# Patient Record
Sex: Male | Born: 1940
Health system: Southern US, Community
[De-identification: ages and names within clinical notes are randomized; demographics above are authoritative.]

## PROBLEM LIST (undated history)

## (undated) DIAGNOSIS — R011 Cardiac murmur, unspecified: Secondary | ICD-10-CM

## (undated) DIAGNOSIS — J189 Pneumonia, unspecified organism: Secondary | ICD-10-CM

## (undated) DIAGNOSIS — R569 Unspecified convulsions: Secondary | ICD-10-CM

## (undated) DIAGNOSIS — M1611 Unilateral primary osteoarthritis, right hip: Secondary | ICD-10-CM

## (undated) DIAGNOSIS — N200 Calculus of kidney: Secondary | ICD-10-CM

## (undated) DIAGNOSIS — I5042 Chronic combined systolic (congestive) and diastolic (congestive) heart failure: Secondary | ICD-10-CM

## (undated) DIAGNOSIS — Z992 Dependence on renal dialysis: Secondary | ICD-10-CM

## (undated) DIAGNOSIS — Z87442 Personal history of urinary calculi: Secondary | ICD-10-CM

## (undated) DIAGNOSIS — B009 Herpesviral infection, unspecified: Secondary | ICD-10-CM

## (undated) DIAGNOSIS — D62 Acute posthemorrhagic anemia: Secondary | ICD-10-CM

## (undated) DIAGNOSIS — K2970 Gastritis, unspecified, without bleeding: Secondary | ICD-10-CM

## (undated) DIAGNOSIS — N186 End stage renal disease: Secondary | ICD-10-CM

## (undated) DIAGNOSIS — I499 Cardiac arrhythmia, unspecified: Secondary | ICD-10-CM

## (undated) DIAGNOSIS — K2990 Gastroduodenitis, unspecified, without bleeding: Secondary | ICD-10-CM

## (undated) DIAGNOSIS — K635 Polyp of colon: Secondary | ICD-10-CM

## (undated) DIAGNOSIS — G4733 Obstructive sleep apnea (adult) (pediatric): Secondary | ICD-10-CM

## (undated) DIAGNOSIS — I4891 Unspecified atrial fibrillation: Secondary | ICD-10-CM

## (undated) DIAGNOSIS — M069 Rheumatoid arthritis, unspecified: Secondary | ICD-10-CM

## (undated) DIAGNOSIS — I10 Essential (primary) hypertension: Secondary | ICD-10-CM

## (undated) DIAGNOSIS — Z9289 Personal history of other medical treatment: Secondary | ICD-10-CM

## (undated) DIAGNOSIS — D696 Thrombocytopenia, unspecified: Secondary | ICD-10-CM

## (undated) DIAGNOSIS — K922 Gastrointestinal hemorrhage, unspecified: Secondary | ICD-10-CM

## (undated) HISTORY — DX: Gastroduodenitis, unspecified, without bleeding: K29.90

## (undated) HISTORY — PX: LITHOTRIPSY: SUR834

## (undated) HISTORY — DX: Rheumatoid arthritis, unspecified: M06.9

## (undated) HISTORY — DX: Chronic combined systolic (congestive) and diastolic (congestive) heart failure: I50.42

## (undated) HISTORY — DX: Obstructive sleep apnea (adult) (pediatric): G47.33

## (undated) HISTORY — DX: Unspecified atrial fibrillation: I48.91

## (undated) HISTORY — DX: Calculus of kidney: N20.0

## (undated) HISTORY — DX: Unilateral primary osteoarthritis, right hip: M16.11

## (undated) HISTORY — DX: Gastritis, unspecified, without bleeding: K29.70

## (undated) HISTORY — DX: Acute posthemorrhagic anemia: D62

## (undated) HISTORY — DX: Polyp of colon: K63.5

## (undated) HISTORY — PX: SPINE SURGERY: SHX786

## (undated) HISTORY — DX: Thrombocytopenia, unspecified: D69.6

## (undated) HISTORY — PX: BACK SURGERY: SHX140

## (undated) HISTORY — PX: CYSTOSCOPY/RETROGRADE/URETEROSCOPY/STONE EXTRACTION WITH BASKET: SHX5317

## (undated) HISTORY — PX: JOINT REPLACEMENT: SHX530

## (undated) HISTORY — DX: Gastrointestinal hemorrhage, unspecified: K92.2

---

## 1992-07-18 HISTORY — PX: CHOLECYSTECTOMY: SHX55

## 1998-07-18 DIAGNOSIS — K635 Polyp of colon: Secondary | ICD-10-CM

## 1998-07-18 HISTORY — DX: Polyp of colon: K63.5

## 2008-04-05 ENCOUNTER — Emergency Department (HOSPITAL_BASED_OUTPATIENT_CLINIC_OR_DEPARTMENT_OTHER): Admission: EM | Admit: 2008-04-05 | Discharge: 2008-04-05 | Payer: Self-pay | Admitting: Emergency Medicine

## 2009-07-22 ENCOUNTER — Ambulatory Visit: Payer: Self-pay | Admitting: Family

## 2009-07-22 DIAGNOSIS — I10 Essential (primary) hypertension: Secondary | ICD-10-CM

## 2009-07-22 DIAGNOSIS — Z87442 Personal history of urinary calculi: Secondary | ICD-10-CM | POA: Insufficient documentation

## 2009-07-22 DIAGNOSIS — I4891 Unspecified atrial fibrillation: Secondary | ICD-10-CM

## 2009-07-22 DIAGNOSIS — M109 Gout, unspecified: Secondary | ICD-10-CM | POA: Insufficient documentation

## 2009-07-22 DIAGNOSIS — M069 Rheumatoid arthritis, unspecified: Secondary | ICD-10-CM

## 2009-07-22 LAB — CONVERTED CEMR LAB: INR: 1.8

## 2009-07-23 ENCOUNTER — Encounter (INDEPENDENT_AMBULATORY_CARE_PROVIDER_SITE_OTHER): Payer: Self-pay | Admitting: *Deleted

## 2009-07-24 ENCOUNTER — Ambulatory Visit: Payer: Self-pay | Admitting: Family

## 2009-07-27 ENCOUNTER — Ambulatory Visit: Payer: Self-pay | Admitting: Family

## 2009-07-27 LAB — CONVERTED CEMR LAB: INR: 2.8

## 2009-07-28 LAB — CONVERTED CEMR LAB
ALT: 21 units/L (ref 0–53)
Albumin: 4.1 g/dL (ref 3.5–5.2)
CO2: 30 meq/L (ref 19–32)
Calcium: 9.6 mg/dL (ref 8.4–10.5)
Chloride: 102 meq/L (ref 96–112)
Creatinine, Ser: 1.3 mg/dL (ref 0.4–1.5)
GFR calc non Af Amer: 70.98 mL/min (ref >60)
Hgb A1c MFr Bld: 6.3 % (ref 4.6–6.5)
PSA: 0.43 ng/mL (ref 0.10–4.00)
Potassium: 4.1 meq/L (ref 3.5–5.1)
Sodium: 140 meq/L (ref 135–145)
Total Protein: 8 g/dL (ref 6.0–8.3)

## 2009-07-30 ENCOUNTER — Encounter: Payer: Self-pay | Admitting: Family

## 2009-07-30 ENCOUNTER — Encounter (INDEPENDENT_AMBULATORY_CARE_PROVIDER_SITE_OTHER): Payer: Self-pay | Admitting: *Deleted

## 2009-08-03 ENCOUNTER — Telehealth (INDEPENDENT_AMBULATORY_CARE_PROVIDER_SITE_OTHER): Payer: Self-pay | Admitting: *Deleted

## 2009-08-06 ENCOUNTER — Ambulatory Visit: Payer: Self-pay | Admitting: Internal Medicine

## 2009-08-17 ENCOUNTER — Ambulatory Visit: Payer: Self-pay | Admitting: Cardiology

## 2009-08-17 ENCOUNTER — Ambulatory Visit: Payer: Self-pay | Admitting: Internal Medicine

## 2009-08-17 ENCOUNTER — Encounter (INDEPENDENT_AMBULATORY_CARE_PROVIDER_SITE_OTHER): Payer: Self-pay | Admitting: Cardiology

## 2009-08-17 LAB — CONVERTED CEMR LAB: POC INR: 2.4

## 2009-08-25 ENCOUNTER — Ambulatory Visit: Payer: Self-pay | Admitting: Gastroenterology

## 2009-08-25 ENCOUNTER — Encounter (INDEPENDENT_AMBULATORY_CARE_PROVIDER_SITE_OTHER): Payer: Self-pay | Admitting: *Deleted

## 2009-08-25 DIAGNOSIS — Z860100 Personal history of colon polyps, unspecified: Secondary | ICD-10-CM | POA: Insufficient documentation

## 2009-08-26 ENCOUNTER — Ambulatory Visit: Payer: Self-pay | Admitting: Family

## 2009-08-26 ENCOUNTER — Encounter (INDEPENDENT_AMBULATORY_CARE_PROVIDER_SITE_OTHER): Payer: Self-pay | Admitting: *Deleted

## 2009-08-26 ENCOUNTER — Encounter: Payer: Self-pay | Admitting: Gastroenterology

## 2009-08-26 LAB — CONVERTED CEMR LAB
Glucose, Bld: 110 mg/dL — ABNORMAL HIGH (ref 70–99)
Sodium: 139 meq/L (ref 135–145)

## 2009-08-28 ENCOUNTER — Telehealth: Payer: Self-pay | Admitting: Family

## 2009-09-07 ENCOUNTER — Ambulatory Visit: Payer: Self-pay | Admitting: Gastroenterology

## 2009-09-07 LAB — HM COLONOSCOPY

## 2009-09-15 ENCOUNTER — Ambulatory Visit: Payer: Self-pay | Admitting: Cardiovascular Disease

## 2009-09-15 LAB — CONVERTED CEMR LAB: POC INR: 1.8

## 2009-09-21 ENCOUNTER — Telehealth: Payer: Self-pay | Admitting: Family

## 2009-09-21 ENCOUNTER — Ambulatory Visit: Payer: Self-pay | Admitting: Family

## 2009-09-21 DIAGNOSIS — N529 Male erectile dysfunction, unspecified: Secondary | ICD-10-CM

## 2009-09-21 DIAGNOSIS — M1990 Unspecified osteoarthritis, unspecified site: Secondary | ICD-10-CM

## 2009-09-21 LAB — CONVERTED CEMR LAB
Basophils Absolute: 0 10*3/uL (ref 0.0–0.1)
Bilirubin Urine: NEGATIVE
Blood in Urine, dipstick: NEGATIVE
CO2: 31 meq/L (ref 19–32)
Calcium: 10 mg/dL (ref 8.4–10.5)
Creatinine, Ser: 1.3 mg/dL (ref 0.4–1.5)
Eosinophils Absolute: 0.2 10*3/uL (ref 0.0–0.7)
GFR calc non Af Amer: 70.95 mL/min (ref >60)
Glucose, Bld: 83 mg/dL (ref 70–99)
Glucose, Urine, Semiquant: NEGATIVE
Ketones, urine, test strip: NEGATIVE
Lymphs Abs: 2.4 10*3/uL (ref 0.7–4.0)
MCV: 93.5 fL (ref 78.0–100.0)
Nitrite: NEGATIVE
Protein, U semiquant: 30
RBC: 4.89 M/uL (ref 4.22–5.81)
Specific Gravity, Urine: 1.01
Urobilinogen, UA: 0.2
WBC Urine, dipstick: NEGATIVE

## 2009-09-22 ENCOUNTER — Encounter: Payer: Self-pay | Admitting: Family Medicine

## 2009-09-23 ENCOUNTER — Ambulatory Visit (HOSPITAL_BASED_OUTPATIENT_CLINIC_OR_DEPARTMENT_OTHER): Admission: RE | Admit: 2009-09-23 | Discharge: 2009-09-23 | Payer: Self-pay | Admitting: Internal Medicine

## 2009-09-23 ENCOUNTER — Ambulatory Visit: Payer: Self-pay | Admitting: Interventional Radiology

## 2009-09-29 ENCOUNTER — Ambulatory Visit: Payer: Self-pay | Admitting: Cardiology

## 2009-09-29 LAB — CONVERTED CEMR LAB: POC INR: 2.7

## 2009-10-08 ENCOUNTER — Telehealth: Payer: Self-pay | Admitting: Family

## 2009-10-12 ENCOUNTER — Ambulatory Visit: Payer: Self-pay | Admitting: Family

## 2009-10-14 ENCOUNTER — Encounter (INDEPENDENT_AMBULATORY_CARE_PROVIDER_SITE_OTHER): Payer: Self-pay | Admitting: Cardiology

## 2009-10-14 ENCOUNTER — Ambulatory Visit: Payer: Self-pay | Admitting: Cardiovascular Disease

## 2009-10-14 LAB — CONVERTED CEMR LAB: POC INR: 2.3

## 2009-10-15 ENCOUNTER — Telehealth (INDEPENDENT_AMBULATORY_CARE_PROVIDER_SITE_OTHER): Payer: Self-pay | Admitting: *Deleted

## 2009-10-20 ENCOUNTER — Inpatient Hospital Stay (HOSPITAL_COMMUNITY): Admission: RE | Admit: 2009-10-20 | Discharge: 2009-10-26 | Payer: Self-pay | Admitting: Orthopaedic Surgery

## 2009-10-20 ENCOUNTER — Encounter (INDEPENDENT_AMBULATORY_CARE_PROVIDER_SITE_OTHER): Payer: Self-pay | Admitting: Orthopaedic Surgery

## 2009-10-24 ENCOUNTER — Encounter (INDEPENDENT_AMBULATORY_CARE_PROVIDER_SITE_OTHER): Payer: Self-pay | Admitting: Orthopaedic Surgery

## 2009-10-24 ENCOUNTER — Ambulatory Visit: Payer: Self-pay | Admitting: Vascular Surgery

## 2009-10-27 ENCOUNTER — Telehealth: Payer: Self-pay | Admitting: Family

## 2009-10-29 ENCOUNTER — Encounter: Payer: Self-pay | Admitting: Family

## 2009-11-10 ENCOUNTER — Encounter: Payer: Self-pay | Admitting: Cardiovascular Disease

## 2009-11-23 ENCOUNTER — Ambulatory Visit: Payer: Self-pay | Admitting: Family

## 2009-11-23 DIAGNOSIS — D649 Anemia, unspecified: Secondary | ICD-10-CM | POA: Insufficient documentation

## 2009-11-23 DIAGNOSIS — E1129 Type 2 diabetes mellitus with other diabetic kidney complication: Secondary | ICD-10-CM

## 2009-11-23 LAB — CONVERTED CEMR LAB
CO2: 27 meq/L (ref 19–32)
Calcium: 10 mg/dL (ref 8.4–10.5)
Glucose, Bld: 94 mg/dL (ref 70–99)
HCT: 35.4 % — ABNORMAL LOW (ref 39.0–52.0)
Hemoglobin: 11.7 g/dL — ABNORMAL LOW (ref 13.0–17.0)
MCHC: 33.1 g/dL (ref 30.0–36.0)
MCV: 91.5 fL (ref 78.0–100.0)
Potassium: 4.2 meq/L (ref 3.5–5.3)
RBC: 3.87 M/uL — ABNORMAL LOW (ref 4.22–5.81)
RDW: 15.9 % — ABNORMAL HIGH (ref 11.5–15.5)

## 2009-11-24 ENCOUNTER — Encounter: Payer: Self-pay | Admitting: Family

## 2009-11-25 ENCOUNTER — Encounter: Admission: RE | Admit: 2009-11-25 | Discharge: 2010-02-23 | Payer: Self-pay | Admitting: Orthopaedic Surgery

## 2009-12-02 ENCOUNTER — Ambulatory Visit: Payer: Self-pay | Admitting: Cardiology

## 2009-12-02 LAB — CONVERTED CEMR LAB: POC INR: 1.9

## 2009-12-16 ENCOUNTER — Ambulatory Visit: Payer: Self-pay | Admitting: Cardiology

## 2009-12-16 LAB — CONVERTED CEMR LAB: POC INR: 2.5

## 2009-12-30 ENCOUNTER — Telehealth: Payer: Self-pay | Admitting: Family

## 2010-01-06 ENCOUNTER — Ambulatory Visit: Payer: Self-pay | Admitting: Cardiology

## 2010-01-06 LAB — CONVERTED CEMR LAB: POC INR: 3.2

## 2010-01-25 ENCOUNTER — Telehealth: Payer: Self-pay | Admitting: Family

## 2010-01-26 ENCOUNTER — Ambulatory Visit: Payer: Self-pay | Admitting: Family

## 2010-01-27 ENCOUNTER — Ambulatory Visit: Payer: Self-pay | Admitting: Internal Medicine

## 2010-01-27 LAB — CONVERTED CEMR LAB: POC INR: 2.3

## 2010-02-03 ENCOUNTER — Telehealth: Payer: Self-pay | Admitting: Family

## 2010-02-24 ENCOUNTER — Ambulatory Visit: Payer: Self-pay | Admitting: Cardiology

## 2010-03-24 ENCOUNTER — Ambulatory Visit: Payer: Self-pay | Admitting: Cardiovascular Disease

## 2010-03-31 ENCOUNTER — Ambulatory Visit: Payer: Self-pay | Admitting: Family

## 2010-04-16 ENCOUNTER — Encounter: Payer: Self-pay | Admitting: Family

## 2010-04-16 LAB — CONVERTED CEMR LAB
Basophils Relative: 0 % (ref 0–1)
Cholesterol: 171 mg/dL (ref 0–200)
Eosinophils Absolute: 0.1 10*3/uL (ref 0.0–0.7)
Eosinophils Relative: 2 % (ref 0–5)
Hemoglobin: 14 g/dL (ref 13.0–17.0)
LDL Cholesterol: 93 mg/dL (ref 0–99)
Lymphs Abs: 2.9 10*3/uL (ref 0.7–4.0)
MCV: 91.8 fL (ref 78.0–100.0)
Monocytes Relative: 6 % (ref 3–12)
Platelets: 277 10*3/uL (ref 150–400)
Total CHOL/HDL Ratio: 2.5
VLDL: 10 mg/dL (ref 0–40)
WBC: 8.2 10*3/uL (ref 4.0–10.5)

## 2010-04-19 ENCOUNTER — Encounter: Payer: Self-pay | Admitting: Family

## 2010-04-21 ENCOUNTER — Ambulatory Visit: Payer: Self-pay | Admitting: Cardiovascular Disease

## 2010-04-21 LAB — CONVERTED CEMR LAB: POC INR: 2.3

## 2010-04-27 ENCOUNTER — Ambulatory Visit: Payer: Self-pay | Admitting: Family

## 2010-04-27 ENCOUNTER — Telehealth: Payer: Self-pay | Admitting: Family

## 2010-04-27 LAB — CONVERTED CEMR LAB
BUN: 20 mg/dL (ref 6–23)
Calcium: 9.4 mg/dL (ref 8.4–10.5)
Creatinine, Ser: 1.1 mg/dL (ref 0.40–1.50)
Glucose, Bld: 102 mg/dL — ABNORMAL HIGH (ref 70–99)
Potassium: 4.2 meq/L (ref 3.5–5.3)

## 2010-04-28 ENCOUNTER — Encounter: Payer: Self-pay | Admitting: Family

## 2010-04-29 ENCOUNTER — Telehealth: Payer: Self-pay | Admitting: Family

## 2010-04-30 ENCOUNTER — Ambulatory Visit: Payer: Self-pay | Admitting: Family

## 2010-05-21 ENCOUNTER — Ambulatory Visit: Payer: Self-pay | Admitting: Cardiology

## 2010-05-31 ENCOUNTER — Telehealth: Payer: Self-pay | Admitting: Family

## 2010-05-31 ENCOUNTER — Ambulatory Visit: Payer: Self-pay | Admitting: Family

## 2010-05-31 DIAGNOSIS — A6000 Herpesviral infection of urogenital system, unspecified: Secondary | ICD-10-CM

## 2010-05-31 LAB — CONVERTED CEMR LAB
Herpes Simplex Vrs I&II-IgM Ab (EIA): 0.23
Uric Acid, Serum: 5.5 mg/dL (ref 4.0–7.8)

## 2010-06-02 ENCOUNTER — Encounter: Payer: Self-pay | Admitting: Family

## 2010-06-03 ENCOUNTER — Telehealth: Payer: Self-pay | Admitting: Family

## 2010-06-04 ENCOUNTER — Telehealth: Payer: Self-pay | Admitting: Family

## 2010-06-21 ENCOUNTER — Ambulatory Visit: Payer: Self-pay | Admitting: Internal Medicine

## 2010-06-21 LAB — CONVERTED CEMR LAB: POC INR: 3.3

## 2010-06-23 ENCOUNTER — Ambulatory Visit: Payer: Self-pay | Admitting: Family

## 2010-07-07 ENCOUNTER — Telehealth: Payer: Self-pay | Admitting: Family

## 2010-07-14 ENCOUNTER — Telehealth: Payer: Self-pay | Admitting: Family

## 2010-07-20 ENCOUNTER — Ambulatory Visit: Admission: RE | Admit: 2010-07-20 | Discharge: 2010-07-20 | Payer: Self-pay | Source: Home / Self Care

## 2010-07-20 LAB — CONVERTED CEMR LAB: POC INR: 1.4

## 2010-07-29 ENCOUNTER — Ambulatory Visit: Admission: RE | Admit: 2010-07-29 | Discharge: 2010-07-29 | Payer: Self-pay | Source: Home / Self Care

## 2010-08-17 NOTE — Medication Information (Signed)
Summary: rov/tm  Anticoagulant Therapy  Managed by: Freddrick March, RN, BSN Referring MD: Cristopher Peru, MD PCP: Nance Pear FNP Supervising MD: Ron Parker MD, Dellis Filbert Indication 1: Atrial fibrillation Lab Used: [facility] [place] Site: [place] INR POC 2.7 INR RANGE 2-3  Dietary changes: no    Health status changes: no    Bleeding/hemorrhagic complications: no    Recent/future hospitalizations: no    Any changes in medication regimen? no    Recent/future dental: no  Any missed doses?: no       Is patient compliant with meds? yes       Allergies (verified): No Known Drug Allergies  Anticoagulation Management History:      The patient is taking warfarin and comes in today for a routine follow up visit.  Positive risk factors for bleeding include an age of 68 years or older.  The bleeding index is 'intermediate risk'.  Positive CHADS2 values include History of HTN.  Negative CHADS2 values include Age > 24 years old.  His last INR was 2.8.  Anticoagulation responsible provider: Ron Parker MD, Dellis Filbert.  INR POC: 2.7.  Cuvette Lot#: [id number].  Exp: 11/2010.    Anticoagulation Management Assessment/Plan:      The patient's current anticoagulation dose is Warfarin sodium 7.5 mg tabs: Use as directed by Anticoagulation Clinic.  The target INR is 2.0-3.0.  The next INR is due 10/20/2009.  Anticoagulation instructions were given to patient.  Results were reviewed/authorized by Freddrick March, RN, BSN.  He was notified by Freddrick March RN.         Prior Anticoagulation Instructions: INR 1.8 Today 10mg s then resume 7.5mg s daily except 10mg s on Thursdays. Recheck in 2 weeks.   Current Anticoagulation Instructions: INR 2.7  Continue on same dosage 7.5mg  daily except 10mg  on Thursdays.  Recheck in 3 weeks.

## 2010-08-17 NOTE — Assessment & Plan Note (Signed)
Summary: NEW PATIENT MEDICARE PHYSICAL AND FASTING LABS?///SPH   Vital Signs:  Patient profile:   70 year old male Height:      75 inches Weight:      310.6 pounds BMI:     38.96 Pulse rate:   52 / minute BP sitting:   150 / 80  (left arm)  Vitals Entered By: [name] (July 22, 2009 10:31 AM) CC: NEW EST-   CC:  NEW EST-.  History of Present Illness: [name] is a 70 year old male who presents today to establish care.  Recently bought a house in [place], lived in [place] for 40 years  AF- on coumadin x greater than 20 years- asymptomatic at this time  DJD- notes that he is following with Dr. Durward Fortes who has recommended R TKR. Tells me that he is being evaluated for possible RA- tells me that RA was positive and he has apt with Endocrinology.  Preventative care- Notes that he exercises daily - uses bike, and lifts    Preventive Screening-Counseling & Management  Alcohol-Tobacco     Smoking Status: never      Drug Use:  no.    Allergies (verified): No Known Drug Allergies  Past History:  Past Medical History: Nephrolithiasis, hx of ? 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 Resp: Denies cough CV:  Denies Chest Pain GI:  Denies nausea or  vomitting GU: Denies dysuria Lymphatic: Denies lymphadenopathy Musculoskeletal:  notes + joint pain- worst in right knee Skin:  notes rash on left elbow Psychiatric: Denies depression Neuro: Denies numbness      Impression & Recommendations:  Problem # 1:  Preventive Health Care (ICD-V70.0) Needs screening colo,  check baseline labs/PSA.  Will attempt to obtain old records as pt unsure of vaccine history Orders:. Reviewed importance of diet and exercise Gastroenterology Referral (GI)  Problem # 2:  ATRIAL FIBRILLATION (ICD-427.31) Assessment: Unchanged EKG- AF, no old EKG available,  will refer to cardiology so that patient may establish with someone local.  Also needs assist with anticoag management- will refer to coumadin clinic.  INR 1.8, pt instructed to take coumadin 10mg  by mouth today and tomorrow, return friday for PT/INR check His updated medication list for this problem includes:    Diltiazem Hcl 60 Mg Tabs (Diltiazem hcl) .[name]... Take two tablet daily    Warfarin Sodium 7.5 Mg Tabs (Warfarin sodium) .[name]... Take one tablet daily    Coumadin 10 Mg Tabs (Warfarin sodium) .[name]... Take as directed  Orders: Cardiology Referral (Cardiology) EKG w/ Interpretation (93000)  Problem # 3:  SINUSITIS (ICD-473.9) Assessment: New  His updated medication list for this problem includes:    Amoxicillin 500 Mg Cap (Amoxicillin) .[name]... Take 1 capsule by mouth three times a day x 10 days  Problem # 4:  GOUT (ICD-274.9) Assessment: Unchanged Stable, patient  with chronic joint pain- undergoing w/u for ?RA, pt to f/u with rheumatology.  Complete Medication List: 1)  Triamterene-hctz 37.5-25 Mg Tabs (Triamterene-hctz) .... Take one tablet daily 2)  Diovan Hct 160-12.5 Mg Tabs (Valsartan-hydrochlorothiazide) .... Take two tablet daily 3)  Enalapril Maleate 10 Mg Tabs (Enalapril maleate) .... Take one tablet daily 4)  Diltiazem Hcl 60 Mg Tabs (Diltiazem hcl) .... Take two tablet daily 5)   Aleve 220 Mg Tabs (Naproxen sodium) .... One tablet by mouth two times a day prn 6)  Amoxicillin 500 Mg Cap (Amoxicillin) .... Take 1 capsule by mouth three times a day x 10 days 7)  Warfarin Sodium 7.5 Mg Tabs (Warfarin sodium) .... Take one tablet daily 8)  Coumadin 10 Mg Tabs (Warfarin sodium) .... Take as directed  Patient Instructions: 1)  Please return fasting for the following blood work; 2)  PSA, BMET,LFT's, A1C 3)  Continue exercise and healthy eating/weight loss efforts. 4)  Please schedule a follow-up appointment in 1 month. Prescriptions: COUMADIN 10 MG TABS (WARFARIN SODIUM) take as directed  #20 x 0   Entered and Authorized by:   Nance Pear FNP   Signed by:   Nance Pear FNP on 07/22/2009   Method used:   Electronically to        [facility] [phone]* (retail)       [address]       [place], [place]  [address]       Ph: [phone]       Fax: [phone]   RxID:   [id number] AMOXICILLIN 500 MG CAP (AMOXICILLIN) Take 1 capsule by mouth three times a day X 10 days  #30 x 0   Entered and Authorized by:   Nance Pear FNP   Signed by:   Nance Pear FNP on 07/22/2009   Method used:   Electronically to        [facility] [phone]* (retail)       [address]       [place], [place]  [address]       Ph: [phone]       Fax: [phone]   RxID:   [id number] DILTIAZEM HCL 60 MG TABS (DILTIAZEM HCL) take two tablet daily  #120 x 2   Entered and Authorized by:   Nance Pear FNP   Signed by:   Nance Pear FNP on 07/22/2009   Method used:   Electronically to        [facility] [phone]* (retail)       [address]       [place], [place]  [address]       Ph: [phone]       Fax: [phone]   RxID:   [id number] ENALAPRIL MALEATE 10 MG TABS (ENALAPRIL MALEATE) take one tablet daily  #30 x 2   Entered and Authorized by:   Nance Pear FNP   Signed by:   Nance Pear FNP on  07/22/2009   Method used:   Electronically to        [facility] [phone]* (retail)       [address]       [place],   [address]       Ph: [phone]       Fax: [phone]   RxID:   [id number] DIOVAN HCT 160-12.5 MG TABS (VALSARTAN-HYDROCHLOROTHIAZIDE) take two tablet daily  #30 x 2  Entered and Authorized by:   Nance Pear FNP   Signed by:   Nance Pear FNP on 07/22/2009   Method used:   Electronically to        [facility] [phone]* (retail)       [address]       [place], [place]  [address]       Ph: [phone]       Fax: [phone]   RxID:   [id number] TRIAMTERENE-HCTZ 37.5-25 MG TABS (TRIAMTERENE-HCTZ) take one tablet daily  #30 x 2   Entered and Authorized by:   Nance Pear FNP   Signed by:   Nance Pear FNP on 07/22/2009   Method used:   Electronically to        [facility] [phone]* (retail)       [address]       [place], [place]  [address]       Ph: [phone]       Fax: [phone]   RxID:   [id number]     Laboratory Results   Blood Tests     PT: 16.4 s   (Normal Range: 10.6-13.4)  INR: 1.8   (Normal Range: 0.88-1.12   Therap INR: 2.0-3.5)      ANTICOAGULATION RECORD  NEW REGIMEN & LAB RESULTS Anticoag. Dx: Atrial fibrillation Current INR Goal Range: 2-3 Current INR: 1.8 Current Coumadin Dose(mg): 7.5mg  daily  Regimen:   (no change)   Appended Document: NEW PATIENT MEDICARE PHYSICAL AND FASTING LABS?///SPH     Allergies: No Known Drug Allergies  Physical Exam  General:  Well-developed,well-nourished,in no acute distress; alert,appropriate and cooperative throughout examination Head:  Normocephalic and atraumatic without obvious abnormalities. No apparent alopecia or balding. Eyes:  PERRLA Ears:  External ear exam shows no significant lesions or deformities.  Otoscopic examination reveals clear canals, tympanic membranes are intact bilaterally without bulging,  retraction, inflammation or discharge. Hearing is grossly normal bilaterally. Mouth:  no lesions Neck:  No deformities, masses, or tenderness noted. Lungs:  Normal respiratory effort, chest expands symmetrically. Lungs are clear to auscultation, no crackles or wheezes. Heart:  Normal rate and regular rhythm. S1 and S2 normal without gallop, murmur, click, rub or other extra sounds. Abdomen:  Bowel sounds positive,abdomen soft and non-tender without masses, organomegaly or hernias noted. Rectal:  declined Msk:  No deformity or scoliosis noted of thoracic or lumbar spine.   Extremities:  No clubbing, cyanosis, edema, or deformity noted with normal full range of motion of all joints.   Neurologic:  No cranial nerve deficits noted. Station and gait are normal. Plantar reflexes are down-going bilaterally. DTRs are symmetrical throughout. Sensory, motor and coordinative functions appear intact. Psych:  Cognition and judgment appear intact. Alert and cooperative with normal attention span and concentration. No apparent delusions, illusions, hallucinations   Complete Medication List: 1)  Triamterene-hctz 37.5-25 Mg Tabs (Triamterene-hctz) .... Take one tablet daily 2)  Diovan Hct 160-12.5 Mg Tabs (Valsartan-hydrochlorothiazide) .... Take two tablet daily 3)  Enalapril Maleate 10 Mg Tabs (Enalapril maleate) .... Take one tablet daily 4)  Diltiazem Hcl 60 Mg Tabs (Diltiazem hcl) .... Take two tablet daily 5)  Aleve 220 Mg Tabs (Naproxen sodium) .... One tablet by mouth two times a day prn 6)  Amoxicillin 500 Mg Cap (Amoxicillin) .... Take 1 capsule by mouth three times a day x 10 days 7)  Warfarin Sodium 7.5 Mg Tabs (Warfarin sodium) .... Take one tablet daily  8)  Coumadin 10 Mg Tabs (Warfarin sodium) .... Take as directed  Appended Document: NEW PATIENT MEDICARE PHYSICAL AND FASTING LABS?///SPH Appt with Rheumatology ,not Endo for possible RA. Both antihypertensive agents with HCTZ (Triam/HCTZ &  Diovan HCT)should be D/Ced as HCTZ will raise uric acid & risk of acute gout flare.Options are Spironolactone 25 mg once daily & Losartan 100 mg once daily with renal function & uric acid monitor.

## 2010-08-17 NOTE — Medication Information (Signed)
Summary: CVRR  Anticoagulant Therapy  Managed by: Tula Nakayama, RN, BSN Referring MD: Cristopher Peru, MD PCP: Nance Pear FNP Supervising MD: Angelena Form MD, Harrell Gave Indication 1: Atrial fibrillation Lab Used: [facility] [place] Site: [place] INR POC 1.8 INR RANGE 2-3  Dietary changes: no    Health status changes: no    Bleeding/hemorrhagic complications: no    Recent/future hospitalizations: no    Any changes in medication regimen? no    Recent/future dental: no  Any missed doses?: no       Is patient compliant with meds? yes      Comments: Restarted on 09/07/09 after Colonoscopy. Off for 5 days.   Allergies: No Known Drug Allergies  Anticoagulation Management History:      The patient is taking warfarin and comes in today for a routine follow up visit.  Positive risk factors for bleeding include an age of 70 years or older.  The bleeding index is 'intermediate risk'.  Positive CHADS2 values include History of HTN.  Negative CHADS2 values include Age > 29 years old.  His last INR was 2.8.  Anticoagulation responsible provider: Angelena Form MD, Harrell Gave.  INR POC: 1.8.  Cuvette Lot#: [id number].  Exp: 11/2010.    Anticoagulation Management Assessment/Plan:      The patient's current anticoagulation dose is Warfarin sodium 7.5 mg tabs: Use as directed by Anticoagulation Clinic.  The next INR is due 09/29/2009.  Anticoagulation instructions were given to patient.  Results were reviewed/authorized by Tula Nakayama, RN, BSN.  He was notified by Tula Nakayama, RN, BSN.         Prior Anticoagulation Instructions: INR 2.4  Continue taking 7.5 mg daily except 10 mg Thursday.  Recheck in 3 - 4 weeks.     Current Anticoagulation Instructions: INR 1.8 Today 10mg s then resume 7.5mg s daily except 10mg s on Thursdays. Recheck in 2 weeks.

## 2010-08-17 NOTE — Progress Notes (Signed)
  Phone Note Outgoing Call   Summary of Call: Left message for pt. to return call @ 11:50am.  Received additional request for refill of Triam-HCTZ. Per 08/28/09 office note, pt. was taken off of this medication. Need to clarify with pt. Initial call taken by: Kelle Darting CMA,  October 08, 2009 11:51 AM  Follow-up for Phone Call        Pt. states he never received spironolactone rx. to replace the triam-hctz. He states he is not aware that he wasn't supposed to be taking it. He also states that he is not taking Losartan-potassium and did not know that med had been changed either. He states he is currently taking Diovan-hct 160/12.5, Enalapril, diltiazem and triam-hctz. Pt was upset and confused. Can you clarify which of these pt. should be taking?  Kelle Darting CMA  October 08, 2009 3:52 PM   Additional Follow-up for Phone Call Additional follow up Details #1::        Those medications had been changed to try to limit his diuretics as this can cause gout flare.  Please let patient know that he can continue to take the meds as is until his follow up visit.  We can discuss and make changes at that time.   Additional Follow-up by: Nance Pear FNP,  October 08, 2009 11:02 PM    Additional Follow-up for Phone Call Additional follow up Details #2::    Advised pt. per Kenechukwu Eckstein to continue the medications he has. Follow up appt. moved to 10/12/09 at 8:15 to clarify meds. Kelle Darting CMA  October 09, 2009 11:41 AM

## 2010-08-17 NOTE — Letter (Signed)
Summary: Generic Letter  [facility] [place], [place] [address]   Phone: [phone]  Fax: [phone]        10/29/2009     [facility] [address] [place], [place]  [address]    Dear Mr. Shimamoto,  I have been unable to reach you by phone and wanted to let you know that we recently received a refill request from your pharmacy for Diovan HCT. We have denied this medication because Debbrah Alar, NP had advised you to stop this medication previously. I left a message at the pharmacy advising them that you are no longer taking this medication.  If you have any questions you may reach me at [phone].  I hope all has gone well with your surgery!  Sincerely,     Kelle Darting CMA

## 2010-08-17 NOTE — Letter (Signed)
Summary: Generic Letter  [facility] at [place], [place] [address]   Phone: [phone]  Fax: [phone]    06/23/2010  Bo Merino M.D. [facility] [address] [place], [place]  Dear Dr. Estanislado Pandy,  I am writing to update you on our mutual patient, [name] DOB 05-Sep-2040.  I wanted to let you know that he has seen me on three separate occasions (10/11, 11/14, 12/7) with complaint of joint pain/swelling. I have treated him with a short course of prednisone on each of these occasions with good response.  His uric acid level has been normal, and I suspect that the pain/inflammation is due to flare up of his RA. He tells me that he has a follow up appointment with you some time in the next few months. Please feel free to contact me if you have any questions, or further wish to discuss his plan of care. Thank you for your help in caring for this nice gentleman.    Sincerely,   Debbrah Alar FNP  Appended Document: Generic Letter Mailed.

## 2010-08-17 NOTE — Medication Information (Signed)
Summary: rov/tm  Anticoagulant Therapy  Managed by: Porfirio Oar, PharmD Referring MD: Cristopher Peru, MD PCP: Nance Pear FNP Supervising MD: Rayann Heman MD, Jeneen Rinks Indication 1: Atrial fibrillation Lab Used: [facility] [place] Site: [place] INR POC 2.3 INR RANGE 2-3  Dietary changes: no    Health status changes: no    Bleeding/hemorrhagic complications: no    Recent/future hospitalizations: no    Any changes in medication regimen? no    Recent/future dental: no  Any missed doses?: no       Is patient compliant with meds? yes       Allergies: No Known Drug Allergies  Anticoagulation Management History:      The patient is taking warfarin and comes in today for a routine follow up visit.  Positive risk factors for bleeding include an age of 68 years or older.  The bleeding index is 'intermediate risk'.  Positive CHADS2 values include History of HTN.  Negative CHADS2 values include Age > 68 years old.  His last INR was 2.8.  Anticoagulation responsible provider: Royale Swamy MD, Jeneen Rinks.  INR POC: 2.3.  Cuvette Lot#: [id number].  Exp: 03/2011.    Anticoagulation Management Assessment/Plan:      The patient's current anticoagulation dose is Warfarin sodium 7.5 mg tabs: Use as directed by Anticoagulation Clinic.  The target INR is 2.0-3.0.  The next INR is due 02/24/2010.  Anticoagulation instructions were given to patient.  Results were reviewed/authorized by Porfirio Oar, PharmD.  He was notified by Lind Covert.         Prior Anticoagulation Instructions: INR 3.2 Skip today's dose then resume 7.5mg s daily except 10mg s on Thursdays. Recheck  in 3 weeks.   Current Anticoagulation Instructions: INR 2.3  Continue same dose of 7.5mg  every day except 10mg  on Thursday.  Re-check INR in 4 weeks.

## 2010-08-17 NOTE — Procedures (Signed)
Summary: Colonoscopy  Patient: [name] Note: All result statuses are Final unless otherwise noted.  Tests: (1) Colonoscopy (COL)   COL Colonoscopy           [facility] [employer]     [place], [place]  [address]           COLONOSCOPY PROCEDURE REPORT           PATIENT:  Bruce Cole, Bruce Cole  MR#:  [id number]     BIRTHDATE:  06-07-1941, 59 yrs. old  GENDER:  male           ENDOSCOPIST:  Loralee Pacas. Sharlett Iles, MD, [facility]     Referred by:  Aundra Millet. Birdie Riddle, M.D.           PROCEDURE DATE:  09/07/2009     PROCEDURE:  Colonoscopy, Diagnostic     ASA CLASS:  Class II     INDICATIONS:  history of polyps           MEDICATIONS:   Fentanyl 100, Versed 10 mg IV           DESCRIPTION OF PROCEDURE:   After the risks benefits and     alternatives of the procedure were thoroughly explained, informed     consent was obtained.  Digital rectal exam was performed and     revealed no abnormalities.   The LB [id number] endoscope     was introduced through the anus and advanced to the cecum, which     was identified by both the appendix and ileocecal valve, without     limitations.  The quality of the prep was excellent, using     MoviPrep.  The instrument was then slowly withdrawn as the colon     was fully examined.     <<PROCEDUREIMAGES>>           FINDINGS:  No polyps or cancers were seen.  This was otherwise a     normal examination of the colon.   Retroflexed views in the rectum     revealed no abnormalities.    The scope was then withdrawn from     the patient and the procedure completed.           COMPLICATIONS:  None           ENDOSCOPIC IMPRESSION:     1) No polyps or cancers     2) Otherwise normal examination     RECOMMENDATIONS:     1) Continue current colorectal screening recommendations for     "routine risk" patients with a repeat colonoscopy in 10 years.           REPEAT EXAM:  No           ______________________________     Loralee Pacas.  Sharlett Iles, MD, [facility] Regal           CC:  Debbrah Alar FNP           n.     [name] Kitchen   Loralee Pacas. Cassady Stanczak at 09/07/2009 02:01 PM           [name], [name], [id number]  Note: An exclamation mark (!) indicates a result that was not dispersed into the flowsheet. Document Creation Date: 09/07/2009 2:01 PM _______________________________________________________________________  (1) Order result status: Final Collection or observation date-time: 09/07/2009 13:54 Requested date-time:  Receipt date-time:  Reported date-time:  Referring Physician:   Ordering Physician: Verl Blalock [phone]) Specimen Source:  Source: [name] Order Number: [id number] Lab site:   Appended Document: Colonoscopy    Clinical Lists Changes  Observations: Added new observation of COLONNXTDUE: 08/2019 (09/07/2009 14:52)

## 2010-08-17 NOTE — Medication Information (Signed)
Summary: ccn/ pt on 10 mg once a day/ gd  Anticoagulant Therapy  Managed by: Alinda Deem, PharmD, BCPS, CPP Referring MD: Cristopher Peru, MD Supervising MD: Aundra Dubin MD, Aibhlinn Kalmar Indication 1: Atrial fibrillation Lab Used: [facility] Site: [place] INR POC 2.4 INR RANGE 2-3  Dietary changes: yes       Details: fewer greens during vacation  Health status changes: no    Bleeding/hemorrhagic complications: no    Recent/future hospitalizations: no    Any changes in medication regimen? no    Recent/future dental: no  Any missed doses?: no       Is patient compliant with meds? yes      Comments: Patient has taken warfarin for 20+ years for AF.  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    Current Medications (verified): 1)  Triamterene-Hctz 37.5-25 Mg Tabs (Triamterene-Hctz) .... Take One Tablet Daily 2)  Losartan Potassium 100 Mg Tabs (Losartan Potassium) .... One Tablet By Mouth Daily 3)  Enalapril Maleate 10 Mg Tabs (Enalapril Maleate) .... Take One Tablet Two Times A Day 4)  Diltiazem Hcl 60 Mg Tabs (Diltiazem Hcl) .... Take Two Tablet Bid 5)  Aleve 220 Mg Tabs (Naproxen Sodium) .... One Tablet By Mouth Two Times A Day Prn 6)  Warfarin Sodium 7.5 Mg Tabs (Warfarin Sodium) .... Take One Tablet Daily 7)  Coumadin 10 Mg Tabs (Warfarin Sodium) .... Take As Directed  Allergies (verified): No Known Drug Allergies  Anticoagulation Management History:      The patient comes in today for his initial visit for anticoagulation therapy.  Positive risk factors for bleeding include an age of 68 years or older.  The bleeding index is 'intermediate risk'.  Positive CHADS2 values include History of HTN.  Negative CHADS2 values include Age > 68 years old.  His last INR was 2.8.  Anticoagulation responsible  provider: Aundra Dubin MD, Merle Whitehorn.  INR POC: 2.4.  Cuvette Lot#: [id number].  Exp: 10/2010.    Anticoagulation Management Assessment/Plan:      The patient's current anticoagulation dose is Warfarin sodium 7.5 mg tabs: take one tablet daily, Coumadin 10 mg tabs: take as directed.  The next INR is due 09/14/2009.  Anticoagulation instructions were given to patient.  Results were reviewed/authorized by Alinda Deem, PharmD, BCPS, CPP.  He was notified by Alinda Deem PharmD, BCPS, CPP.         Current Anticoagulation Instructions: INR 2.4  Continue taking 7.5 mg daily except 10 mg Thursday.  Recheck in 3 - 4 weeks.     Appended Document: ccn/ pt on 10 mg once a day/ gd    Prescriptions: WARFARIN SODIUM 7.5 MG TABS (WARFARIN SODIUM) take one tablet daily  #90 x 2   Entered by:   Alinda Deem PharmD, BCPS, CPP   Authorized by:   [name], MD   Signed by:   Alinda Deem PharmD, BCPS, CPP on 08/17/2009   Method used:   Electronically to        [facility] [phone]* (retail)       [address]       [place], [place]  [address]       Ph: [phone]       Fax: [phone]   RxID:   [id number]

## 2010-08-17 NOTE — Medication Information (Signed)
Summary: Coumadin Clinic  Anticoagulant Therapy  Managed by: Inactive Referring MD: Cristopher Peru, MD PCP: Nance Pear FNP Supervising MD: Johnsie Cancel MD, Collier Salina Indication 1: Atrial fibrillation Lab Used: [facility] Site: [place] INR RANGE 2-3          Comments: Pt. states he just got home from rehab after having his knee done. He states that Dr. Durward Fortes is now dosing his coumadin post surgery.   Allergies: No Known Drug Allergies  Anticoagulation Management History:      Positive risk factors for bleeding include an age of 68 years or older.  The bleeding index is 'intermediate risk'.  Positive CHADS2 values include History of HTN.  Negative CHADS2 values include Age > 42 years old.  His last INR was 2.8.  Anticoagulation responsible provider: Johnsie Cancel MD, Collier Salina.  Exp: 11/2010.    Anticoagulation Management Assessment/Plan:      The patient's current anticoagulation dose is Warfarin sodium 7.5 mg tabs: Use as directed by Anticoagulation Clinic.  The target INR is 2.0-3.0.  The next INR is due 11/03/2009.  Anticoagulation instructions were given to patient.  Results were reviewed/authorized by Inactive.         Prior Anticoagulation Instructions: INR 2.3 Weight 284 pounds  Take last warfarin (coumadin) dose on Friday, 4/1.  Take nothing on Saturday 4/2.  Begin lovenox (enoxaparin) 120 mg injection subcutaneously two times a day at 8 am on Sunday, 4/3.  Give last injection on Monday 4/4 at 8 am.    My cell phone for questions:  [phone]--Mary Parker,PharmD

## 2010-08-17 NOTE — Letter (Signed)
Summary: Primary Care Consult Scheduled Letter  [facility] [place]   [place], [place] [address]   Phone: [phone]  Fax: [phone]      07/23/2009 MRN: [name]  [facility] [address] [place], [place]  [address]    Dear Mr. Huelsmann,    We have scheduled an appointment for you.  At the recommendation of Debbrah Alar FNP, we have scheduled you a consult with Dr. Lovena Le with [facility] on 08-17-2009 at 10:00am.  Their address is [address]rd floor, [place] [address]. The office phone number is [phone].  If this appointment day and time is not convenient for you, please feel free to call the office of the doctor you are being referred to at the number listed above and reschedule the appointment.    It is important for you to keep your scheduled appointments. We are here to make sure you are given good patient care.   Thank you,    Renee, Patient Care Coordinator [place] at [facility]

## 2010-08-17 NOTE — Progress Notes (Signed)
  Phone Note Outgoing Call   Call placed by: Nance Pear FNP,  February 03, 2010 9:28 AM Call placed to: Patient Summary of Call: Called patient to follow up on his gout.  Pt notes gout is improved, no problems currently. Initial call taken by: Nance Pear FNP,  February 03, 2010 9:28 AM

## 2010-08-17 NOTE — Assessment & Plan Note (Signed)
Summary: PT CHECK/CDJ   Vital Signs:  Patient profile:   70 year old male Weight:      310.6 pounds Pulse rate:   60 / minute  Vitals Entered By: [name] (July 27, 2009 11:55 AM) CC: pt check   CC:  pt check.  Allergies: No Known Drug Allergies   Complete Medication List: 1)  Triamterene-hctz 37.5-25 Mg Tabs (Triamterene-hctz) .... Take one tablet daily 2)  Diovan Hct 160-12.5 Mg Tabs (Valsartan-hydrochlorothiazide) .... Take two tablet daily 3)  Enalapril Maleate 10 Mg Tabs (Enalapril maleate) .... Take one tablet daily 4)  Diltiazem Hcl 60 Mg Tabs (Diltiazem hcl) .... Take two tablet daily 5)  Aleve 220 Mg Tabs (Naproxen sodium) .... One tablet by mouth two times a day prn 6)  Amoxicillin 500 Mg Cap (Amoxicillin) .... Take 1 capsule by mouth three times a day x 10 days 7)  Warfarin Sodium 7.5 Mg Tabs (Warfarin sodium) .... Take one tablet daily 8)  Coumadin 10 Mg Tabs (Warfarin sodium) .... Take as directed  Other Orders: Protime [id number])  Patient Instructions: 1)  7.5mg  daily except 10mg  on Thurs. 2)  recheck 08/07/09   Laboratory Results   Blood Tests     PT: 20.4 s   (Normal Range: 10.6-13.4)  INR: 2.8   (Normal Range: 0.88-1.12   Therap INR: 2.0-3.5) Comments: current dose:  has been taking 10mg  since 07/22/09   NEW DOSE: 7.5mg  daily except 10mg  on thurs. recheck on 08/06/09.Marland KitchenMarland KitchenMalachi Bonds  July 27, 2009 11:59 AM       Patient Instructions: 1)  7.5mg  daily except 10mg  on Thurs. 2)  recheck 08/07/09     ANTICOAGULATION RECORD PREVIOUS REGIMEN & LAB RESULTS Anticoagulation Diagnosis:  Atrial fibrillation on  07/22/2009 Previous INR Goal Range:  2-3 on  07/22/2009 Previous INR:  1.8 on  07/22/2009 Previous Coumadin Dose(mg):  7.5mg  daily  on  07/22/2009   NEW REGIMEN & LAB RESULTS Anticoag. Dx: Atrial fibrillation Current INR: 2.8 Regimen: 7.5mg  daily except 10mg  on thurs. Coagulation Comments: recheck 08/06/09

## 2010-08-17 NOTE — Progress Notes (Signed)
Summary: CXR before clearance  Phone Note Outgoing Call   Summary of Call: Pls call Bruce Cole and let him know that he also needs to  complete a chest-xray.  This can be completed downstairs at [facility] at his convenience.  Once we have this back and the lab/urine- we can clear him for surgery.  Also, I sent a prescription to his pharmacy for name brand colchicine which is the only kind that is currently being made.  Thanks Initial call taken by: Nance Pear FNP,  September 21, 2009 1:54 PM  Follow-up for Phone Call        Pt notified and will go to [facility] office for CXR.  He is aware we will await for CXR and urine result before clearance can be given. Follow-up by: Kelle Darting CMA,  September 22, 2009 8:54 AM

## 2010-08-17 NOTE — Assessment & Plan Note (Signed)
Summary: 2 MONTH FU/DT--Rm 5   Vital Signs:  Patient profile:   70 year old male Height:      75 inches Weight:      268.25 pounds BMI:     33.65 Temp:     98.3 degrees F oral Pulse rate:   60 / minute Pulse rhythm:   regular Resp:     16 per minute BP sitting:   158 / 80  (left arm) Cuff size:   large  Vitals Entered By: Kelle Darting CMA Bruce Cole) (March 31, 2010 10:59 AM) CC: Rm 5   2 month f/u., Hypertension Management Is Patient Diabetic? No   Primary Care Provider:  Nance Pear FNP  CC:  Rm 5   2 month f/u. and Hypertension Management.  History of Present Illness: Bruce Cole is a 70 year old male who presents today for follow up.   1) Gout-  has cut back on shrimp and red meat.  This has helped.  He is also continuing on Allopurinol. Only one flare since last visit.    2) HTN- BP is up today- f/u BP in office was 150/80.  Pt reports that at home when he checks his blood pressure it is usually around 135/70.  Reports + compliance with his medications.        Hypertension History:      He denies headache, chest pain, palpitations, dyspnea with exertion, peripheral edema, and visual symptoms.        Positive major cardiovascular risk factors include male age 28 years old or older and hypertension.  Negative major cardiovascular risk factors include non-tobacco-user status.     Allergies (verified): No Known Drug Allergies  Past History:  Past Medical History: Last updated: 07/22/2009 Nephrolithiasis, hx of ? Rheumatoid arthritis Colon polyps- done 2000 Atrial fibrillation Hypertension Gout  Past Surgical History: Last updated: 11/23/2009 Kidney Stones Spinal Surgery x2 Total hip replacement-L hip Cholecystectomy-1994 Right Knee Replacement--10/20/09  Physical Exam  General:  Well-developed,well-nourished,in no acute distress; alert,appropriate and cooperative throughout examination Lungs:  Normal respiratory effort, chest expands  symmetrically. Lungs are clear to auscultation, no crackles or wheezes. Heart:  Normal rate and regular rhythm. S1 and S2 normal without gallop, murmur, click, rub or other extra sounds. Extremities:  No clubbing, cyanosis, edema, or deformity noted with normal full range of motion of all joints.   Psych:  Cognition and judgment appear intact. Alert and cooperative with normal attention span and concentration. No apparent delusions, illusions, hallucinations   Impression & Recommendations:  Problem # 1:  GOUT (ICD-274.9) Assessment Improved Improved, continue Allopurinol.  Colcrys for acute flare ups. His updated medication list for this problem includes:    Colcrys 0.6 Mg Tabs (Colchicine) .[name]... Take 2 tabs by mouth at start of gout flare, and 1 tablet 3 hours later.  (max 3 tabs total per gout attack)    Allopurinol 100 Mg Tabs (Allopurinol) .[name]..[name] Two tablets by mouth daily  Problem # 2:  HYPERTENSION (ICD-401.9) Assessment: Deteriorated BP is up today.  Pt to monitor BP at home, call if elevated.  Continue current meds His updated medication list for this problem includes:    Losartan Potassium 100 Mg Tabs (Losartan potassium) ..... One tablet by mouth daily    Enalapril Maleate 10 Mg Tabs (Enalapril maleate) .[name]... Take one tablet two times a day    Diltiazem Hcl 60 Mg Tabs (Diltiazem hcl) .[name]... Take two tablet bid    Triamterene-hctz 37.5-25 Mg Tabs (Triamterene-hctz) .Marland KitchenMarland KitchenMarland KitchenMarland Kitchen  One tablet by mouth daily  BP today: 158/80 Prior BP: 136/90 (01/26/2010)  10 Yr Risk Heart Disease: Not enough information  Labs Reviewed: K+: 4.2 (11/23/2009) Creat: : 1.01 (11/23/2009)     Problem # 3:  UNSPECIFIED ANEMIA (ICD-285.9) Assessment: Comment Only Mild anemia noted in May- may have been due to operative blood loss.  Will repeat. Orders: TLB-CBC Platelet - w/Differential ([id number])  Complete Medication List: 1)  Losartan Potassium 100 Mg Tabs (Losartan potassium) .... One tablet by mouth  daily 2)  Enalapril Maleate 10 Mg Tabs (Enalapril maleate) .... Take one tablet two times a day 3)  Diltiazem Hcl 60 Mg Tabs (Diltiazem hcl) .... Take two tablet bid 4)  Warfarin Sodium 7.5 Mg Tabs (Warfarin sodium) .... Use as directed by anticoagulation clinic 5)  Colcrys 0.6 Mg Tabs (Colchicine) .... Take 2 tabs by mouth at start of gout flare, and 1 tablet 3 hours later.  (max 3 tabs total per gout attack) 6)  Allopurinol 100 Mg Tabs (Allopurinol) .... Two tablets by mouth daily 7)  Viagra 100 Mg Tabs (Sildenafil citrate) .... One tablet by mouth 1/2 hour prior to sexual activity 8)  Triamterene-hctz 37.5-25 Mg Tabs (Triamterene-hctz) .... One tablet by mouth daily 9)  Percocet 5-325 Mg Tabs (Oxycodone-acetaminophen) .... Take 2 tablest every four hours as needed for pain 10)  Methocarbamol 500 Mg Tabs (Methocarbamol) .... Take 1 tablet by mouth every 6 hours if needed. 11)  Indomethacin 25 Mg Caps (Indomethacin) .... One tablet twice daily as needed for severe gout pain only  Other Orders: TLB-Lipid Panel ([id number])  Hypertension Assessment/Plan:      The patient's hypertensive risk group is category B: At least one risk factor (excluding diabetes) with no target organ damage.  Today's blood pressure is 158/80.    Patient Instructions: 1)  Please return fasting to the lab at your earliest convenience. 2)  Call if your blood pressure is greater than 140/90 at home, check once a day. 3)  Follow up in 1 month.   Current Allergies (reviewed today): No known allergies     Contraindications/Deferment of Procedures/Staging:    Test/Procedure: FLU VAX    Reason for deferment: patient declined

## 2010-08-17 NOTE — Assessment & Plan Note (Signed)
Summary: FOLLOW UP/MHF   Vital Signs:  Patient profile:   70 year old male Height:      75 inches Weight:      280.25 pounds BMI:     35.16 Temp:     97.8 degrees F oral Pulse rate:   66 / minute Pulse rhythm:   regular Resp:     18 per minute BP sitting:   150 / 78  (right arm) Cuff size:   large  Vitals Entered By: Kelle Darting CMA (Nov 23, 2009 11:16 AM)   Primary Care Provider:  Nance Pear FNP   History of Present Illness: Bruce Cole is a 70 year old male who presents today for follow up.     1) DJD- He underwent a right TKA the first week of April which was followed by a rehabilitation stay.  Pt reports that his post-operative course was stablet without any cardiac issues or need for transfusions.  He has been having some right knee pain at night, however overall he is feeling well and has improved mobility.     2) HTN- He reports that he has been monitoring BP at home and has been running 120- 135/60-65.    3) Gout- had flare up in hospital which was relieved with course of colchicine, no symptoms since.    Allergies (verified): No Known Drug Allergies  Past History:  Past Surgical History: Kidney Stones Spinal Surgery x2 Total hip replacement-L hip Cholecystectomy-1994 Right Knee Replacement--10/20/09  Physical Exam  General:  Well-developed,well-nourished,in no acute distress; alert,appropriate and cooperative throughout examination Head:  Normocephalic and atraumatic without obvious abnormalities. No apparent alopecia or balding. Lungs:  Normal respiratory effort, chest expands symmetrically. Lungs are clear to auscultation, no crackles or wheezes. Heart:  Normal rate and regular rhythm. S1 and S2 normal without gallop, murmur, click, rub or other extra sounds. Msk:  R knee with some swelling, knee incision is healing well- no drainage or erythema   Impression & Recommendations:  Problem # 1:  DEGENERATIVE JOINT DISEASE, ADVANCED  (ICD-715.90) Assessment Improved S/P R TKA, pt instructed to follow up with Dr. Durward Fortes for additional pain medication His updated medication list for this problem includes:    Percocet 5-325 Mg Tabs (Oxycodone-acetaminophen) .[name]... Take 2 tablest every four hours as needed for pain  Orders: T-CBC No Diff [id number])  Problem # 2:  HYPERGLYCEMIA (ICD-790.29) Assessment: Unchanged Patient has lost 30 pounds (intentional) since he came to [place] in January.  He has been monitoring carbs and has been exercising.  Last A1C was 6.3.  Will repeat today, anticipate improvment. Orders: T-Hgb A1C [id number])  Problem # 3:  HYPERTENSION (ICD-401.9) Assessment: Unchanged Patient reports that his BP's at home are better than today's reading.  In addition, review of patient's hospital record indicates that his BP ran 120-130/ 70's for most of his hospitalization.  He is not sure that he is taking triamterene-HCTZ and notes that he is bothered by LE swelling.  Pt instructed to resume.  F/u BP here in the office was 140/70.   His updated medication list for this problem includes:    Losartan Potassium 100 Mg Tabs (Losartan potassium) ..... One tablet by mouth daily    Enalapril Maleate 10 Mg Tabs (Enalapril maleate) .[name]... Take one tablet two times a day    Diltiazem Hcl 60 Mg Tabs (Diltiazem hcl) .[name]... Take two tablet bid    Triamterene-hctz 37.5-25 Mg Tabs (Triamterene-hctz) ..... One tablet by mouth daily  Orders:  T-Basic Metabolic Panel ([id number])  BP today: 150/78 Prior BP: 140/78 (10/12/2009)  Labs Reviewed: K+: 4.1 (09/21/2009) Creat: : 1.3 (09/21/2009)     Problem # 4:  GOUT (ICD-274.9) Assessment: Improved Patient's diuretics have been stream lined, continue allopurinol.  No current gout symptoms. His updated medication list for this problem includes:    Colcrys 0.6 Mg Tabs (Colchicine) .[name]... Take as needed for gout flare    Allopurinol 100 Mg Tabs (Allopurinol) ..... One  tablet by mouth daily  Complete Medication List: 1)  Losartan Potassium 100 Mg Tabs (Losartan potassium) .... One tablet by mouth daily 2)  Enalapril Maleate 10 Mg Tabs (Enalapril maleate) .... Take one tablet two times a day 3)  Diltiazem Hcl 60 Mg Tabs (Diltiazem hcl) .... Take two tablet bid 4)  Warfarin Sodium 7.5 Mg Tabs (Warfarin sodium) .... Use as directed by anticoagulation clinic 5)  Colcrys 0.6 Mg Tabs (Colchicine) .... Take as needed for gout flare 6)  Allopurinol 100 Mg Tabs (Allopurinol) .... One tablet by mouth daily 7)  Viagra 100 Mg Tabs (Sildenafil citrate) .... One tablet by mouth 1/2 hour prior to sexual activity 8)  Triamterene-hctz 37.5-25 Mg Tabs (Triamterene-hctz) .... One tablet by mouth daily 9)  Percocet 5-325 Mg Tabs (Oxycodone-acetaminophen) .... Take 2 tablest every four hours as needed for pain 10)  Methocarbamol 500 Mg Tabs (Methocarbamol) .... Take 1 tablet by mouth every 6 hours if needed.  Patient Instructions: 1)  Please follow up in 2 months, sooner if problems or concern. Prescriptions: TRIAMTERENE-HCTZ 37.5-25 MG TABS (TRIAMTERENE-HCTZ) one tablet by mouth daily  #30 x 1   Entered and Authorized by:   Nance Pear FNP   Signed by:   Nance Pear FNP on 11/23/2009   Method used:   Electronically to        [facility] [phone]* (retail)       [address]       [place], [place]  [address]       Ph: [phone]       Fax: [phone]   RxID:   [id number]   Current Allergies (reviewed today): No known allergies     Vital Signs:  Patient Profile:   70 year old male Height:     75 inches Weight:      280.25 pounds BMI:     35.16 Temp:     97.8 degrees F oral Pulse rate:   66 / minute Pulse rhythm:   regular Resp:     18 per minute BP sitting:   150 / 78 Cuff size:   large

## 2010-08-17 NOTE — Letter (Signed)
   [place] [facility] [place], [place]  [address]  [place] Phone: [phone]      April 28, 2010   [facility] [address] [place], [place] [id number]  RE:  LAB RESULTS  Dear  Bruce Cole,  The following is an interpretation of your most recent lab tests.  Please take note of any instructions provided or changes to medications that have resulted from your lab work.  ELECTROLYTES:  Good - no changes needed  KIDNEY FUNCTION TESTS:  Good - no changes needed   Your uric acid level is normal.  I do believe that your most recent flare is due to the rheumatoid.  The prednisone taper should help with this.  Please let us know if your symptoms do not improve.   Sincerely Yours,    Nance Pear FNP  Appended Document:  mailed

## 2010-08-17 NOTE — Procedures (Signed)
Summary: HOLD/[facility]  HOLD/[facility]   Imported By: Bubba Hales 09/03/2009 10:19:28  _____________________________________________________________________  External Attachment:    Type:   Image     Comment:   External Document

## 2010-08-17 NOTE — Assessment & Plan Note (Signed)
Summary: PAIN/PER Bruce Cole   Vital Signs:  Patient profile:   70 year old male Height:      75 inches Weight:      255 pounds BMI:     31.99 O2 Sat:      98 % on Room air Temp:     97.9 degrees F oral Pulse rate:   54 / minute Resp:     16 per minute BP sitting:   160 / 70  (left arm) Cuff size:   large  Vitals Entered By: Jiles Garter CMA (May 31, 2010 2:52 PM)  O2 Flow:  Room air CC: Pain in right wrist Is Patient Diabetic? No Pain Assessment Patient in pain? yes     Location: Wrist Intensity: 8 Type: aching Onset of pain  Constant Comments c/o constant right wrist pain, sudden onset yesterday   Primary Care Provider:  Nance Pear FNP  CC:  Pain in right wrist.  History of Present Illness: Bruce Cole is a 70 yea old male who presents today with complaint of pain in his right wrist.  He reports that this pain started 2 days ago.  Prior to this time he had pain in both ankles and left wrist which has resolved.  He notes that he was out of allopurinol for 3 days prior to the start of this joint pain.    HTN- notes that he has been monitoring his blood pressure at home, and that it has been stable.  He feels BP is up today due to his joint pain.  History of HSV-  pt tells me that 10 years ago he developed what he believed to be an initial genital herpes outbreak.  Found out his wife was cheating on him.  Denies any further outbreaks since that time. Now in a serious relationship, wants to know what he can do to help prevent transmission to his partner.    Allergies (verified): No Known Drug Allergies  Past History:  Past Medical History: Last updated: 07/22/2009 Nephrolithiasis, hx of ? Rheumatoid arthritis Colon polyps- done 2000 Atrial fibrillation Hypertension Gout  Past Surgical History: Last updated: 11/23/2009 Kidney Stones Spinal Surgery x2 Total hip replacement-L hip Cholecystectomy-1994 Right Knee Replacement--10/20/09  Review  of Systems       see HPI  Physical Exam  General:  Well-developed,well-nourished,in no acute distress; alert,appropriate and cooperative throughout examination Lungs:  Normal respiratory effort, chest expands symmetrically. Lungs are clear to auscultation, no crackles or wheezes. Heart:  Normal rate and regular rhythm. S1 and S2 normal without gallop, murmur, click, rub or other extra sounds. Extremities:  R wrist + erythema + swelling   Impression & Recommendations:  Problem # 1:  GOUT (ICD-274.9) Assessment Deteriorated Suspect gout, however RA flare is also a possibility.  Rx provided for percocet.  Check uric acid level.  If high will have pt initiate colcrys. His updated medication list for this problem includes:    Colcrys 0.6 Mg Tabs (Colchicine) .[name]... Take 2 tabs by mouth at start of gout flare, and 1 tablet 3 hours later.  (max 3 tabs total per gout attack)    Allopurinol 100 Mg Tabs (Allopurinol) .[name]..[name] Two tablets by mouth daily  Orders: TLB-Uric Acid, Blood (84550-URIC)  Problem # 2:  GENITAL HERPES (ICD-054.10) Assessment: Comment Only Check antibody titers to confirm diagnosis.  If + will initiate suppressive therapy.  Pt was counseled on safe sex/condom use.   Orders: T-Herpes I and II, IgM, Reflex [id number])  Complete  Medication List: 1)  Enalapril Maleate 10 Mg Tabs (Enalapril maleate) .... Take one tablet two times a day 2)  Diltiazem Hcl 60 Mg Tabs (Diltiazem hcl) .... Take two tablet bid 3)  Warfarin Sodium 7.5 Mg Tabs (Warfarin sodium) .... Use as directed by anticoagulation clinic 4)  Colcrys 0.6 Mg Tabs (Colchicine) .... Take 2 tabs by mouth at start of gout flare, and 1 tablet 3 hours later.  (max 3 tabs total per gout attack) 5)  Allopurinol 100 Mg Tabs (Allopurinol) .... Two tablets by mouth daily 6)  Viagra 100 Mg Tabs (Sildenafil citrate) .... One tablet by mouth 1/2 hour prior to sexual activity 7)  Triamterene-hctz 37.5-25 Mg Tabs  (Triamterene-hctz) .... One tablet by mouth daily 8)  Percocet 5-325 Mg Tabs (Oxycodone-acetaminophen) .... Take 1 tablet every four hours as needed for pain 9)  Methocarbamol 500 Mg Tabs (Methocarbamol) .... Take 1 tablet by mouth every 6 hours if needed. 10)  Prilosec Otc 20 Mg Tbec (Omeprazole magnesium) .... One tablet by mouth daily x 3 weeks 11)  Diovan 160 Mg Tabs (Valsartan) .... One tablet by mouth once daily  Patient Instructions: 1)  Please complete your lab work downstairs today. 2)  Please follow up in December as scheduled.   Prescriptions: PERCOCET 5-325 MG TABS (OXYCODONE-ACETAMINOPHEN) Take 1 tablet every four hours as needed for pain  #20 x 0   Entered and Authorized by:   Nance Pear FNP   Signed by:   Nance Pear FNP on 05/31/2010   Method used:   Print then Give to Patient   RxID:   [id number] COLCRYS 0.6 MG TABS (COLCHICINE) take 2 tabs by mouth at start of gout flare, and 1 tablet 3 hours later.  (Max 3 tabs total per gout attack)  #20 Tablet x 1   Entered and Authorized by:   Nance Pear FNP   Signed by:   Nance Pear FNP on 05/31/2010   Method used:   Electronically to        [facility] [phone]* (retail)       [address]       [place], [place]  [address]       Ph: [phone]       Fax: [phone]   RxID:   [id number]    Orders Added: 1)  TLB-Uric Acid, Blood [84550-URIC] 2)  T-Herpes I and II, IgM, Reflex [phone] 3)  Est. Patient Level III [id number]   Immunization History:  Influenza Immunization History:    Influenza:  declined (05/31/2010)   Contraindications/Deferment of Procedures/Staging:    Test/Procedure: FLU VAX    Reason for deferment: patient declined   Immunization History:  Influenza Immunization History:    Influenza:  Declined (05/31/2010)  Current Allergies (reviewed today): No known allergies

## 2010-08-17 NOTE — Progress Notes (Signed)
Summary: BP reading   Phone Note Call from Patient Call back at Home Phone [phone]   Caller: Patient Reason for Call: Talk to Nurse Summary of Call: pt called to say his bp reading last night was 150/95 Initial call taken by: Titus Dubin,  April 29, 2010 9:45 AM  Follow-up for Phone Call        Spoke to pt and he states that his BP earlier this am was 138/68 and now is 160/100.  Pt has no other complaints. Asked pt to bring BP monitor to office this a.m. for nurse visit. Pt wants to know if taking pain med and prednisone together could be causing the elevation or does he need to go back on BP med w/diuretic combo?   Pt was seen and evaluated by Debbrah Alar, NP this a.m. Diuretic not advised. Concerns were addressed. Gilmore Laroche Fergerson CMA Deborra Medina)  April 30, 2010 11:31 AM

## 2010-08-17 NOTE — Assessment & Plan Note (Signed)
Summary: DISCUSS COLON ON BT--CH.   History of Present Illness Visit Type: consult  Primary GI MD: Verl Blalock MD FACP [place] Primary Provider: Nance Pear FNP Requesting Provider: Annye Asa, MD Chief Complaint: Consult colon.  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 Patient gives a history of having had a colon polyp removed by rigid sigmoidoscopy some 25 years ago. Allegedly he has had multiple colonoscopies since that time which all were normal, with his last exam in year 2000.  Currently he is  asymptomatic and denies upper gastrointestinal or lower GI problems. He relates he does yearly Hemoccult cards which been guaiac negative. He denies any history of anemia or fatty liver. He currently is on a self-imposed diet and has lost 40-50 pounds. He has a history of atrial fibrillation and is chronically on Coumadin, diltiazem,enalapril, triamterene/HCTZ, and losartan. He denies palpitations, shortness of breath, chest pain, or any other cardiovascular or pulmonary complaints. Additional medical problems include rheumatoid arthritis with multiple joint replacements, hypertension, history of kidney stones, and history of gout. He has had previous spine surgery, removal of kidney stones, and cholecystectomy.  He follows a regular diet denies any food intolerances. He exercises 2 hours daily. Cannot get a history of peripheral emboli, CVAs, or peripheral vascular disease. He is followed by our cardiologist and the Coumadin clinic.He has no history of cardiac surgery, stents, previous MIs or CVAs. He has never been a smoker and does not abuse alcohol or NSAIDs.   GI Review of Systems      Denies abdominal pain, acid reflux, belching, bloating, chest pain, dysphagia with liquids, dysphagia with solids, heartburn, loss of appetite, nausea,  vomiting, vomiting blood, weight loss, and  weight gain.        Denies anal fissure, black tarry stools, change in bowel habit, constipation, diarrhea, diverticulosis, fecal incontinence, heme positive stool, hemorrhoids, irritable bowel syndrome, jaundice, light color stool, liver problems, rectal bleeding, and  rectal pain.    Current Medications (verified): 1)  Triamterene-Hctz 37.5-25 Mg Tabs (Triamterene-Hctz) .... Take One Tablet Daily 2)  Losartan Potassium 100 Mg Tabs (Losartan Potassium) .... One Tablet By Mouth Daily 3)  Enalapril Maleate 10 Mg Tabs (Enalapril Maleate) .... Take One Tablet Two Times A Day 4)  Diltiazem Hcl 60 Mg Tabs (Diltiazem Hcl) .... Take Two Tablet Bid 5)  Warfarin Sodium 7.5 Mg Tabs (Warfarin Sodium) .... Use As Directed By Anticoagulation Clinic  Allergies (verified): No Known Drug Allergies  Past History:  Past medical, surgical, family and social histories (including risk factors) reviewed for relevance to current acute and chronic problems.  Past Medical History: Reviewed history from 07/22/2009 and no changes required. Nephrolithiasis, hx of ? Rheumatoid arthritis Colon polyps- done 2000 Atrial fibrillation Hypertension Gout  Past Surgical History: Reviewed history from 07/22/2009 and no changes required. Kidney Stones Spinal Surgery x2 Total hip replacement-L hip Cholecystectomy-1994  Family History: Reviewed history from 07/22/2009 and no changes required. CAD-no HTN-mother,father DM-no STROKE-no COLON CA-no PROSTATE CA-no  Mom-OA (?RA) Dad- ?RA brother- died in [place] brother A and W Sister deceased following complications for a stroke in her 8s  3 children alive and well  Social History: Reviewed history from 07/22/2009 and no changes required. 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  The patient denies allergy/sinus, anemia, anxiety-new, blood in urine, breast changes/lumps, change in vision, confusion, cough, coughing up blood, depression-new, fainting, fatigue, fever, headaches-new, hearing problems, heart rhythm changes, itching, muscle pains/cramps, night sweats, nosebleeds, shortness of breath, skin rash, sleeping problems, sore throat, swelling of feet/legs, swollen lymph glands, thirst - excessive, urination - excessive, urination changes/pain, urine leakage, vision changes, and voice change.   General:  Denies fever, chills, sweats, anorexia, fatigue, weakness, malaise, weight loss, and sleep disorder. Eyes:  Denies blurring, diplopia, irritation, discharge, vision loss, scotoma, eye pain, and photophobia. ENT:  Denies earache, ear discharge, tinnitus, decreased hearing, nasal congestion, loss of smell, nosebleeds, sore throat, hoarseness, and difficulty swallowing. CV:  Denies chest pains, angina, palpitations, syncope, dyspnea on exertion, orthopnea, PND, peripheral edema, and claudication. Resp:  Denies dyspnea at rest, dyspnea with exercise, cough, sputum, wheezing, coughing up blood, and pleurisy. GI:  Denies difficulty swallowing, pain on swallowing, nausea, indigestion/heartburn, vomiting, vomiting blood, abdominal pain, jaundice, gas/bloating, diarrhea, constipation, change in bowel habits, bloody BM's, black BMs, and fecal incontinence. GU:  Denies urinary burning, blood in urine, urinary frequency, urinary hesitancy, nocturnal urination, urinary incontinence, penile discharge, genital sores, decreased libido, and erectile dysfunction. MS:  Complains of joint pain / LOM, joint stiffness, joint deformity, and low back pain; denies joint swelling, muscle weakness, muscle cramps, muscle atrophy, leg pain at night, leg pain with exertion, and shoulder pain / LOM hand / wrist pain (CTS). Derm:  Denies rash, itching, dry skin, hives, moles,  warts, and unhealing ulcers. Neuro:  Complains of abnormal sensation, sciatica, and radiculopathy other:; denies weakness, paralysis, seizures, syncope, tremors, vertigo, transient blindness, frequent falls, frequent headaches, difficulty walking, headache, restless legs, memory loss, and confusion. Psych:  Denies depression, anxiety, memory loss, suicidal ideation, hallucinations, paranoia, phobia, and confusion. Endo:  Denies cold intolerance, heat intolerance, polydipsia, polyphagia, polyuria, unusual weight change, and hirsutism. Heme:  Denies bruising, bleeding, enlarged lymph nodes, and pagophagia. Allergy:  Denies hives, rash, sneezing, hay fever, and recurrent infections.  Vital Signs:  Patient profile:   70 year old male Height:      75 inches Weight:      309 pounds BMI:     38.76 BSA:     2.64 Pulse rate:   60 / minute Pulse rhythm:   regular BP sitting:   136 / 74  (left arm) Cuff size:   regular  Vitals Entered By: Hope Pigeon CMA (August 25, 2009 8:14 AM)  Physical Exam  General:  Well developed, well nourished, no acute distress.healthy appearing.   Head:  Normocephalic and atraumatic. Eyes:  PERRLA, no icterus.exam deferred to patient's ophthalmologist.   Lungs:  Clear throughout to auscultation. Heart:  Regular rate and rhythm; no murmurs, rubs,  or bruits. Abdomen:  Soft, nontender and nondistended. No masses, hepatosplenomegaly or hernias noted. Normal bowel sounds.obese.   Rectal:  deferred until time of colonoscopy.   Extremities:  No clubbing, cyanosis, edema or deformities noted. Neurologic:  Alert and  oriented x4;  grossly normal neurologically. Psych:  Alert and cooperative. Normal mood and affect.   Impression & Recommendations:  Problem # 1:  PERSONAL HX COLONIC POLYPS (ICD-V12.72) Assessment Unchanged Colonoscopy but this risk and benefits have been explained in detail and he is agreed to proceed as planned. Adjustments will be made in his  Coumadin by holding his Coumadin 5 days before this procedure unless otherwise advised by the Coumadin  clinic. I doubt he will need Lovenox coverage. He has had previous conscious sedation without difficulties. He appears to be in excellent health at this time next appears to be in a regular rhythm on cardiac exam. He is to continue all other medications as listed and reviewed in his chart.  Problem # 2:  COUMADIN THERAPY (ICD-V58.61) Assessment: Unchanged Continue dosage as per Alinda Deem in the Coumadin clinic with adjustments for colonoscopy  Problem # 3:  HYPERTENSION (ICD-401.9) Assessment: Improved blood pressure today is normal at 136/74 and pulse is 64 and regular.  Problem # 4:  ATRIAL FIBRILLATION (ICD-427.31) Assessment: Improved Continue all cardiac medications.  Patient Instructions: 1)  Copy sent to : Alinda Deem at the [facility] Coumadin clinic 2)  Please continue current medications.  3)  Colonoscopy and Flexible Sigmoidoscopy brochure given.  4)  Conscious Sedation brochure given.  5)  Hold Coumadin 5 days before colonoscopy and continue all other medications as previously prescribed including your cardiac medications on the day of the procedure  Appended Document: DISCUSS COLON ON BT--CH.    Clinical Lists Changes  Medications: Added new medication of MOVIPREP 100 GM  SOLR (PEG-KCL-NACL-NASULF-NA ASC-C) As per prep instructions. - Signed Rx of MOVIPREP 100 GM  SOLR (PEG-KCL-NACL-NASULF-NA ASC-C) As per prep instructions.;  #1 x 0;  Signed;  Entered by: Alberteen Spindle RN;  Authorized by: Sable Feil MD [facility];  Method used: Electronically to [facility] [phone]*, [address] [place], [place]  [address], Ph: [phone], Fax: [phone] Orders: Added new Test order of Colonoscopy (Colon) - Signed    Prescriptions: MOVIPREP 100 GM  SOLR (PEG-KCL-NACL-NASULF-NA ASC-C) As per prep instructions.  #1 x 0   Entered by:   Alberteen Spindle RN   Authorized  by:   Sable Feil MD [facility]   Signed by:   Alberteen Spindle RN on 08/25/2009   Method used:   Electronically to        [facility] [phone]* (retail)       [address]       [place], [place]  [address]       Ph: [phone]       Fax: [phone]   RxID:   [id number]

## 2010-08-17 NOTE — Assessment & Plan Note (Signed)
Summary: bp check / tf,cma  Nurse Visit   Vital Signs:  Patient profile:   70 year old male Height:      75 inches BP sitting:   172 / 90  (left arm) Cuff size:   large  Vitals Entered By: Jiles Garter CMA (April 30, 2010 9:44 AM)  Physical Exam  General:  Well-developed,well-nourished,in no acute distress; alert,appropriate and cooperative throughout examination   Impression & Recommendations:  Problem # 1:  HYPERTENSION (ICD-401.9) Assessment Comment Only  Patient tells me that he has been taking his Triamterene-HCTZ.  He also tells me that he has had better luck with his blood pressure in the past on diovan instead of losartan.  He is willing to pay the extra copay.   Instructed pt to stop the prednisone since his joint pain is resolved and this is likely contributing to his recent elevation of his blood pressure.  He was instructed to call if his blood pressure is greater than 160/90.  Also instructed patient to follow up by phone in 1 week with daily BP readings for our review.  He verbalized undstanding. The following medications were removed from the medication list:    Losartan Potassium 100 Mg Tabs (Losartan potassium) ..... One tablet by mouth daily His updated medication list for this problem includes:    Enalapril Maleate 10 Mg Tabs (Enalapril maleate) .[name]... Take one tablet two times a day    Diltiazem Hcl 60 Mg Tabs (Diltiazem hcl) .[name]... Take two tablet bid    Triamterene-hctz 37.5-25 Mg Tabs (Triamterene-hctz) ..... One tablet by mouth daily    Diovan 160 Mg Tabs (Valsartan) ..... One tablet by mouth once daily  Orders: Prescription Created Electronically [id number])  Complete Medication List: 1)  Enalapril Maleate 10 Mg Tabs (Enalapril maleate) .... Take one tablet two times a day 2)  Diltiazem Hcl 60 Mg Tabs (Diltiazem hcl) .... Take two tablet bid 3)  Warfarin Sodium 7.5 Mg Tabs (Warfarin sodium) .... Use as directed by anticoagulation clinic 4)  Colcrys 0.6  Mg Tabs (Colchicine) .... Take 2 tabs by mouth at start of gout flare, and 1 tablet 3 hours later.  (max 3 tabs total per gout attack) 5)  Allopurinol 100 Mg Tabs (Allopurinol) .... Two tablets by mouth daily 6)  Viagra 100 Mg Tabs (Sildenafil citrate) .... One tablet by mouth 1/2 hour prior to sexual activity 7)  Triamterene-hctz 37.5-25 Mg Tabs (Triamterene-hctz) .... One tablet by mouth daily 8)  Percocet 5-325 Mg Tabs (Oxycodone-acetaminophen) .... Take 2 tablest every four hours as needed for pain 9)  Methocarbamol 500 Mg Tabs (Methocarbamol) .... Take 1 tablet by mouth every 6 hours if needed. 10)  Prilosec Otc 20 Mg Tbec (Omeprazole magnesium) .... One tablet by mouth daily x 3 weeks 11)  Diovan 160 Mg Tabs (Valsartan) .... One tablet by mouth once daily  CC: Elevated Blood Pressure   Allergies: No Known Drug Allergies  Orders Added: 1)  Prescription Created Electronically [G8553] 2)  Est. Patient Level II [id number] Prescriptions: DIOVAN 160 MG TABS (VALSARTAN) one tablet by mouth once daily  #30 x 2   Entered and Authorized by:   Nance Pear FNP   Signed by:   Nance Pear FNP on 04/30/2010   Method used:   Electronically to        [facility] [phone]* (retail)       [address]       [place], [place]  [address]  Ph: [phone]       Fax: [phone]   [id number]   Appended Document: bp check / tf,cma Received call from pharmacy stating they did not receive Rx from this a.m. Gave verbal per office note to pharmacist.

## 2010-08-17 NOTE — Medication Information (Signed)
Summary: rov/ewj  Anticoagulant Therapy  Managed by: Alinda Deem, PharmD, BCPS, CPP Referring MD: Cristopher Peru, MD PCP: Nance Pear FNP Supervising MD: Ron Parker MD, Dellis Filbert Indication 1: Atrial fibrillation Lab Used: [facility] Site: [facility] INR POC 2.3 INR RANGE 2-3  Dietary changes: no    Health status changes: yes       Details: pending knee surgery--10/20/2009  Bleeding/hemorrhagic complications: no    Recent/future hospitalizations: no    Any changes in medication regimen? no    Recent/future dental: no  Any missed doses?: no       Is patient compliant with meds? yes       Allergies (verified): No Known Drug Allergies  Anticoagulation Management History:      The patient is taking warfarin and comes in today for a routine follow up visit.  Positive risk factors for bleeding include an age of 68 years or older.  The bleeding index is 'intermediate risk'.  Positive CHADS2 values include History of HTN.  Negative CHADS2 values include Age > 7 years old.  His last INR was 2.8.  Anticoagulation responsible provider: Ron Parker MD, Dellis Filbert.  INR POC: 2.3.  Exp: 11/2010.    Anticoagulation Management Assessment/Plan:      The patient's current anticoagulation dose is Warfarin sodium 7.5 mg tabs: Use as directed by Anticoagulation Clinic.  The target INR is 2.0-3.0.  The next INR is due 11/03/2009.  Anticoagulation instructions were given to patient.  Results were reviewed/authorized by Alinda Deem, PharmD, BCPS, CPP.  He was notified by Alinda Deem PharmD, BCPS, CPP.         Prior Anticoagulation Instructions: INR 2.7  Continue on same dosage 7.5mg  daily except 10mg  on Thursdays.  Recheck in 3 weeks.    Current Anticoagulation Instructions: INR 2.3 Weight 284 pounds  Take last warfarin (coumadin) dose on Friday, 4/1.  Take nothing on Saturday 4/2.  Begin lovenox (enoxaparin) 120 mg injection subcutaneously two times a day at 8 am on Sunday,  4/3.  Give last injection on Monday 4/4 at 8 am.    My cell phone for questions:  [phone]--Mary Parker,PharmD

## 2010-08-17 NOTE — Letter (Signed)
   Bruce Cole at [facility] [place], [place]  [address]  [place] Phone: [phone]      April 19, 2010   [place] [address] [place], [place] [id number]  RE:  LAB RESULTS  Dear  Bruce Cole,  The following is an interpretation of your most recent lab tests.  Please take note of any instructions provided or changes to medications that have resulted from your lab work.  LIPID PANEL:  Good - no changes needed Triglyceride: 48   Cholesterol: 171   LDL: 93   HDL: 68   Chol/HDL%:  2.5 Ratio   CBC:  Good - no changes needed   Sincerely Yours,    Nance Pear FNP  Appended Document:  mailed

## 2010-08-17 NOTE — Progress Notes (Signed)
Summary: Needs refill for gout med & pain med  Phone Note Refill Request Message from:  Patient on January 25, 2010 9:20 AM  Refills Requested: Medication #1:  COLCRYS 0.6 MG TABS take as needed for gout flare Pt also states he needs something for the gout pain, pls send to [place], pt will be coming into his appt tomorrow, but is out of meds today  Initial call taken by: Titus Dubin,  January 25, 2010 9:21 AM Caller: Patient  Follow-up for Phone Call        Please call patient and let him know that I have increased the dose of his allopurinol and refilled the colcrys rx.  Both have been sent to his pharmacy.  He should take 2 tabs of colcrys today, followed by one tablet an hour later.  This should help his gout pain.   Follow-up by: Nance Pear FNP,  January 25, 2010 2:49 PM  Additional Follow-up for Phone Call Additional follow up Details #1::        Pt notified.  Gilmore Laroche Fergerson CMA ([place])  January 25, 2010 5:10 PM     New/Updated Medications: COLCRYS 0.6 MG TABS (COLCHICINE) take 2 tabs by mouth at start of gout flare, and 1 tablet 3 hours later.  (Max 3 tabs total per gout attack) ALLOPURINOL 100 MG TABS (ALLOPURINOL) two tablets by mouth daily Prescriptions: ALLOPURINOL 100 MG TABS (ALLOPURINOL) two tablets by mouth daily  #60 x 0   Entered and Authorized by:   Nance Pear FNP   Signed by:   Nance Pear FNP on 01/25/2010   Method used:   Electronically to        [facility] [phone]* (retail)       [address]       [place], [place]  [address]       Ph: [phone]       Fax: [phone]   RxID:   [id number] COLCRYS 0.6 MG TABS (COLCHICINE) take 2 tabs by mouth at start of gout flare, and 1 tablet 3 hours later.  (Max 3 tabs total per gout attack)  #20 x 0   Entered and Authorized by:   Nance Pear FNP   Signed by:   Nance Pear FNP on 01/25/2010   Method used:   Electronically to        [facility] [phone]*  (retail)       [address]       [place], [place]  [address]       Ph: [phone]       Fax: [phone]   RxID:   [id number]

## 2010-08-17 NOTE — Progress Notes (Signed)
Summary: refill--Colcrys  Phone Note Refill Request Message from:  [facility] [phone] on 12/29/09 1:39pm  Refills Requested: Medication #1:  COLCRYS 0.6 MG TABS take as needed for gout flare   Dosage confirmed as above?Dosage Confirmed   Supply Requested: 1 month   Last Refilled: 11/10/2009 Next Appointment Scheduled: 01/26/10 with Iam Lipson Initial call taken by: Kelle Darting CMA,  December 30, 2009 8:55 AM    Prescriptions: COLCRYS 0.6 MG TABS (COLCHICINE) take as needed for gout flare  #20 x 0   Entered by:   Kelle Darting CMA   Authorized by:   Nance Pear FNP   Signed by:   Kelle Darting CMA on 12/30/2009   Method used:   Electronically to        [facility] [phone]* (retail)       [address]       [place], [place]  [address]       Ph: [phone]       Fax: [phone]   RxID:   [id number]

## 2010-08-17 NOTE — Letter (Signed)
Summary: Anticoagulation Modification Letter  [facility]  [place], [place] [address]   Phone: [phone]  Fax: [phone]    August 26, 2009  Re:    Bruce Cole DOB:    17-Jul-1941 MRN:    [name]    Dear Alinda Deem:  We have scheduled the above patient for an endoscopic procedure. Our records show that  he/she is on anticoagulation therapy. Please advise as to how long the patient may come off their therapy of Coumadin prior to the scheduled procedure(s) on 09/07/09.   Please fax back/or route the completed form to Alberteen Spindle RN at [phone].  Thank you for your help with this matter.  Sincerely,  Alberteen Spindle RN   Physician Recommendation:    Hold Coumadin 5 days prior ____________  Other ______________________________

## 2010-08-17 NOTE — Assessment & Plan Note (Signed)
Summary: one mth fu/ns/kdc   Vital Signs:  Patient profile:   70 year old male Weight:      309 pounds Pulse rate:   70 / minute BP sitting:   146 / 80  (left arm)  Vitals Entered By: [name] (August 26, 2009 2:35 PM) CC: 1 MONTH ROA  Comments -L wrist having gout flare x3 days -Indomethicin was d/c by rheu due to coumadin but patient states that it helped w/ flares    Primary Care Provider:  Nance Pear FNP  CC:  1 MONTH ROA .  History of Present Illness: Bruce Cole is a 70 year old male who presents today with c/o 3 day history of gouty flare in his left wrist.  Notes that indomethacin was discontinued by Dr. Patrecia Pour due to his being on coumadin.  Notes that he was told at his most recent rheumatology visit that he does indeed have RA, but that she does not wish to initiate treatment until after his knee replacement surgery which is tentatively scheduled for the spring.    Allergies: No Known Drug Allergies  Physical Exam  General:  overweight AA male, NAD Head:  Normocephalic and atraumatic without obvious abnormalities. No apparent alopecia or balding. Lungs:  Normal respiratory effort, chest expands symmetrically. Lungs are clear to auscultation, no crackles or wheezes. Heart:  s1/s2, irregular rate/rythm Prostate:  Prostate gland firm and smooth, no enlargement, nodularity, tenderness, mass, asymmetry or induration. hemocult negative Extremities:  left wrist swollen, warm, mild erythema   Impression & Recommendations:  Problem # 1:  GOUT (ICD-274.9) Assessment Deteriorated He has been using colchicine on a as needed basis.  Will plan to continue two times a day for next few days and will treat with short prednisone taper.   Orders: TLB-Uric Acid, Blood ([id number]) Prescription Created Electronically [phone])  His updated medication list for this problem includes:    Colchicine 0.6 Mg Tabs (Colchicine) .[name]... As needed for gout flare  Problem #  2:  HYPERTENSION (ICD-401.9) Assessment: Comment Only Last visit his Diovan HCT was changed to losartan.  He is still however on triamterene- hctz.  This may be exacerbating his gout.  Will plan to D/C triamterene-hctz and switch to spirololactone 25mg  by mouth daily.  BP not yet at goal, will continue to titrate meds.  Plan f/u BMET and uric acid level in 1 month. His updated medication list for this problem includes:    Spironolactone 25 Mg Tabs (Spironolactone) ..... One tablet by mouth daily    Losartan Potassium 100 Mg Tabs (Losartan potassium) ..... One tablet by mouth daily    Enalapril Maleate 10 Mg Tabs (Enalapril maleate) .[name]... Take one tablet two times a day    Diltiazem Hcl 60 Mg Tabs (Diltiazem hcl) .[name]... Take two tablet bid  BP today: 146/80 Prior BP: 136/74 (08/25/2009)  Labs Reviewed: K+: 4.1 (07/24/2009) Creat: : 1.3 (07/24/2009)     Orders: T-Basic Metabolic Panel (99991111)  Complete Medication List: 1)  Spironolactone 25 Mg Tabs (Spironolactone) .... One tablet by mouth daily 2)  Losartan Potassium 100 Mg Tabs (Losartan potassium) .... One tablet by mouth daily 3)  Enalapril Maleate 10 Mg Tabs (Enalapril maleate) .... Take one tablet two times a day 4)  Diltiazem Hcl 60 Mg Tabs (Diltiazem hcl) .... Take two tablet bid 5)  Warfarin Sodium 7.5 Mg Tabs (Warfarin sodium) .... Use as directed by anticoagulation clinic 6)  Moviprep 100 Gm Solr (Peg-kcl-nacl-nasulf-na asc-c) .... As per prep  instructions. 7)  Prednisone 10 Mg Tabs (Prednisone) .... Take as directed 8)  Colchicine 0.6 Mg Tabs (Colchicine) .... As needed for gout flare  Patient Instructions: 1)  prednisone- take 4 tablets daily x 2 days, then 3 tabs daily x 2 days, then 2 tabs daily x 2 days, then 1 tab daily for 2 days then stop. 2)  You may also continue colchicine twice daily  for the next few days until your gout improves. 3)  Call if you continue to have pain in your wrist after completing this  medicine.   4)  Follow up in 1 month for a BP check. Prescriptions: SPIRONOLACTONE 25 MG TABS (SPIRONOLACTONE) one tablet by mouth daily  #30 x 1   Entered and Authorized by:   Nance Pear FNP   Signed by:   Nance Pear FNP on 08/26/2009   Method used:   Electronically to        [facility] [phone]* (retail)       [address]       [place], [place]  [address]       Ph: [phone]       Fax: [phone]   RxID:   [id number] PREDNISONE 10 MG TABS (PREDNISONE) take as directed  #20 x 0   Entered and Authorized by:   Nance Pear FNP   Signed by:   Nance Pear FNP on 08/26/2009   Method used:   Electronically to        [facility] [phone]* (retail)       [address]       [place], [place]  [address]       Ph: [phone]       Fax: [phone]   RxID:   [id number]

## 2010-08-17 NOTE — Progress Notes (Signed)
  Phone Note Outgoing Call   Call placed by: Nance Pear FNP,  August 28, 2009 5:39 PM Summary of Call: Case discussed with Dr. Shawna Orleans, increased risk of hyperkalemia with addition of spironolactone.  I called pharmacy and cancelled this order.  Will plan to continue triamterene/hctz for now and will add allopurinol for gout prevention.  I called patient and discussed plan for allopurinol. Plan f/u in 1 month.  Will repeat BP at that time and consider increasing vasotec at that time as needed.   Initial call taken by: Nance Pear FNP,  August 28, 2009 5:47 PM    New/Updated Medications: ALLOPURINOL 100 MG TABS (ALLOPURINOL) one tablet by mouth daily Prescriptions: ALLOPURINOL 100 MG TABS (ALLOPURINOL) one tablet by mouth daily  #30 x 1   Entered and Authorized by:   Nance Pear FNP   Signed by:   Nance Pear FNP on 08/28/2009   Method used:   Electronically to        [facility] [phone]* (retail)       [address]       [place], [place]  [address]       Ph: [phone]       Fax: [phone]   RxID:   [id number]

## 2010-08-17 NOTE — Medication Information (Signed)
Summary: rov/ewj  Anticoagulant Therapy  Managed by: Tula Nakayama, RN, BSN Referring MD: Cristopher Peru, MD PCP: Nance Pear FNP Supervising MD: Harrington Challenger MD, Nevin Bloodgood Indication 1: Atrial fibrillation Lab Used: [facility] [place] Site: [place] INR POC 3.3 INR RANGE 2-3  Dietary changes: no    Health status changes: no    Bleeding/hemorrhagic complications: no    Recent/future hospitalizations: no    Any changes in medication regimen? no    Recent/future dental: no  Any missed doses?: no       Is patient compliant with meds? yes       Allergies: No Known Drug Allergies  Anticoagulation Management History:      The patient is taking warfarin and comes in today for a routine follow up visit.  Positive risk factors for bleeding include an age of 68 years or older.  The bleeding index is 'intermediate risk'.  Positive CHADS2 values include History of HTN.  Negative CHADS2 values include Age > 76 years old.  His last INR was 2.8.  Anticoagulation responsible provider: Harrington Challenger MD, Nevin Bloodgood.  INR POC: 3.3.  Cuvette Lot#: [id number].  Exp: 06/2011.    Anticoagulation Management Assessment/Plan:      The patient's current anticoagulation dose is Warfarin sodium 7.5 mg tabs: Use as directed by Anticoagulation Clinic.  The target INR is 2.0-3.0.  The next INR is due 07/20/2010.  Anticoagulation instructions were given to patient.  Results were reviewed/authorized by Tula Nakayama, RN, BSN.  He was notified by Tula Nakayama, RN, BSN.         Prior Anticoagulation Instructions: INR 2.9  Continue on same dosage 7.5mg  daily except 10mg  on Thursdays.  Recheck in 4 weeks.   Current Anticoagulation Instructions: INR 3.3 Skip today's dose then resume 7.5mg s daily except 10mg s on Thursdays. Recheck in 4  weeks.

## 2010-08-17 NOTE — Letter (Signed)
Summary: Handout Printed  Printed Handout:  - Coumadin Instructions-w/out Meds 

## 2010-08-17 NOTE — Assessment & Plan Note (Signed)
Summary: follow up, clarify meds. / tf,cma   Vital Signs:  Patient profile:   70 year old male Height:      75 inches Weight:      294.50 pounds BMI:     36.94 Temp:     97.2 degrees F oral Pulse rate:   72 / minute Pulse rhythm:   regular Resp:     16 per minute BP sitting:   140 / 78  (left arm) Cuff size:   large  Vitals Entered By: Kelle Darting CMA (October 12, 2009 8:20 AM) CC: room 17  Follow up and needs clarification on meds. Is Patient Diabetic? No   Primary Care Provider:  Nance Pear FNP  CC:  room 17  Follow up and needs clarification on meds..  History of Present Illness: Bruce Cole is a 70 year old male who presents today in follow up of his HTN.  Patient wishes to clarify his medications.  Notes that he stopped his Triamterene-HCTZ on thursday.  He notes that his gout has been well controlled, and he has been monitoring his BP at home.  Denies lower extremity swelling.  Patient is scheduled for knee replacement next week.   Allergies (verified): No Known Drug Allergies  Physical Exam  General:  Well-developed,well-nourished,in no acute distress; alert,appropriate and cooperative throughout examination Lungs:  Normal respiratory effort, chest expands symmetrically. Lungs are clear to auscultation, no crackles or wheezes. Heart:  s1/s2, irregular rate and rythm Extremities:  No clubbing, cyanosis, edema, or deformity noted with normal full range of motion of all joints.     Impression & Recommendations:  Problem # 1:  HYPERTENSION (ICD-401.9) Assessment Comment Only Had initially planned to switch patient to aldactone in place of triamterene-hctz.  However, after further discussion with Dr. Shawna Orleans, it was decided that this would increase his risk of hyperkalemia.  I am concerned that he may develop volume issues if diuretics are stopped completely.   As his gout is currently well controlled, will plan to resume triamterene-HCTZ for now.  I did discuss  with patient that if his gout flares start to become more frequent, then we will need to consider alternatives.  He verbalizes understanding.  His updated medication list for this problem includes:    Losartan Potassium 100 Mg Tabs (Losartan potassium) ..... One tablet by mouth daily    Enalapril Maleate 10 Mg Tabs (Enalapril maleate) .[name]... Take one tablet two times a day    Diltiazem Hcl 60 Mg Tabs (Diltiazem hcl) .[name]... Take two tablet bid    Triamterene-hctz 37.5-25 Mg Tabs (Triamterene-hctz) ..... One tablet by mouth daily  Problem # 2:  ATRIAL FIBRILLATION (ICD-427.31) Assessment: Comment Only Noted Dr. Tanna Furry note to hold coumadin x 4 days pre-op.  Pt has appointment with coumadin clinic tomorrow to discuss lovenox bridging etc.   His updated medication list for this problem includes:    Diltiazem Hcl 60 Mg Tabs (Diltiazem hcl) .[name]... Take two tablet bid    Warfarin Sodium 7.5 Mg Tabs (Warfarin sodium) ..... Use as directed by anticoagulation clinic  Complete Medication List: 1)  Losartan Potassium 100 Mg Tabs (Losartan potassium) .... One tablet by mouth daily 2)  Enalapril Maleate 10 Mg Tabs (Enalapril maleate) .... Take one tablet two times a day 3)  Diltiazem Hcl 60 Mg Tabs (Diltiazem hcl) .... Take two tablet bid 4)  Warfarin Sodium 7.5 Mg Tabs (Warfarin sodium) .... Use as directed by anticoagulation clinic 5)  Colcrys 0.6 Mg Tabs (  Colchicine) .... Take as needed for gout flare 6)  Allopurinol 100 Mg Tabs (Allopurinol) .... One tablet by mouth daily 7)  Viagra 100 Mg Tabs (Sildenafil citrate) .... One tablet by mouth 1/2 hour prior to sexual activity 8)  Triamterene-hctz 37.5-25 Mg Tabs (Triamterene-hctz) .... One tablet by mouth daily  Patient Instructions: 1)  Please hold your coumadin fo 4 days prior to your surgery- further instructions per coumadin clinic.   2)  Please follow up after your surgery. 3)  Resume Triamterene-HCTZ 4)  Good luck with your  surgery! Prescriptions: TRIAMTERENE-HCTZ 37.5-25 MG TABS (TRIAMTERENE-HCTZ) one tablet by mouth daily  #30 x 1   Entered and Authorized by:   Nance Pear FNP   Signed by:   Nance Pear FNP on 10/12/2009   Method used:   Electronically to        [facility] [phone]* (retail)       [address]       [place],   [address]       Ph: [phone]       Fax: [phone]   RxID:   [id number]   Current Allergies (reviewed today): No known allergies

## 2010-08-17 NOTE — Progress Notes (Signed)
Summary: discuss labs  Phone Note Outgoing Call   Summary of Call: Please call Bruce Cole and let him know that his uric acid level is up on his lab work.  I would like to make some changes to his blood pressure medicines.  Please ask Bruce. Chand to discontinue diovan HCT and start losartin instead.  He should continue triamterene/HCTZ for now.  Have patient follow up in 2 weeks for an appointment with me please.  Thanks Initial call taken by: Nance Pear FNP,  August 03, 2009 12:26 PM  Follow-up for Phone Call        left message on machine .....Marland KitchenMarland KitchenMalachi Bonds  August 04, 2009 10:53 AM   spoke w/ patient says he couldn't find a place that he could have protime done at so patient will be coming back from [facility] tomorrow and will come and have it checked then.........Marland KitchenMalachi Bonds  August 13, 2009 9:32 AM     New/Updated Medications: LOSARTAN POTASSIUM 100 MG TABS (LOSARTAN POTASSIUM) one tablet by mouth daily Prescriptions: LOSARTAN POTASSIUM 100 MG TABS (LOSARTAN POTASSIUM) one tablet by mouth daily  #30 x 1   Entered and Authorized by:   Nance Pear FNP   Signed by:   Nance Pear FNP on 08/03/2009   Method used:   Electronically to        [facility] [phone]* (retail)       [address]       [place], [place]  [address]       Ph: [phone]       Fax: [phone]   RxID:   [id number]

## 2010-08-17 NOTE — Assessment & Plan Note (Signed)
Summary: bp check/mhf--Rm 5   Vital Signs:  Patient profile:   70 year old male Height:      75 inches Weight:      268.75 pounds BMI:     33.71 Temp:     98.3 degrees F oral Pulse rate:   60 / minute Pulse rhythm:   regular Resp:     18 per minute BP sitting:   158 / 80  (left arm) Cuff size:   large  Vitals Entered By: Kelle Darting CMA Deborra Medina) (April 27, 2010 10:54 AM)  Serial Vital Signs/Assessments:  Time      Position  BP       Pulse  Resp  Temp     By 11:04 AM            156/74                         Kelle Darting CMA (AAMA)  CC: Rm 5  1 month f/u. Pt states he currently has a rheumatoid flare up. Is Patient Diabetic? No Pain Assessment Patient in pain? yes     Location: wrists and ankles Intensity: 10 Onset of pain  Last week. Pt states he was unable to sleep last night due to the pain.   Primary Care Dawna Jakes:  Nance Pear FNP  CC:  Rm 5  1 month f/u. Pt states he currently has a rheumatoid flare up.[name]  History of Present Illness: Mr Bomer is a 70 year old male who presents today for follow up of his HTN.  Last visit he reported that he was checking his blood pressure regularly at home and that his BP is usually around 130/70-80.  He continues to get these types or readings at home.  He denies any history of high readings at home and notes + med compliance.  He is excited about a new "love" interest in his life.  RA/Gout- he last saw Dr. Estanislado Pandy in January.    Has been taking his colchicine the last few days with little improvement in his joint pain or inflammation.    Allergies (verified): No Known Drug Allergies  Past History:  Past Medical History: Last updated: 07/22/2009 Nephrolithiasis, hx of ? Rheumatoid arthritis Colon polyps- done 2000 Atrial fibrillation Hypertension Gout  Past Surgical History: Last updated: 11/23/2009 Kidney Stones Spinal Surgery x2 Total hip replacement-L hip Cholecystectomy-1994 Right Knee  Replacement--10/20/09  Review of Systems       see HPI  Physical Exam  General:  Well-developed,well-nourished,in no acute distress; alert,appropriate and cooperative throughout examination Head:  Normocephalic and atraumatic without obvious abnormalities. No apparent alopecia or balding. Lungs:  Normal respiratory effort, chest expands symmetrically. Lungs are clear to auscultation, no crackles or wheezes. Heart:  Normal rate and regular rhythm. S1 and S2 normal without gallop, murmur, click, rub or other extra sounds. Extremities:  +warmth and tenderness with mild swelling of left wrist.  Also mild swelling of the right ankle.   Impression & Recommendations:  Problem # 1:  RHEUMATOID ARTHRITIS (ICD-714.0) Assessment Deteriorated  Suspect acute RA flare.  Will plan to treat with prednisone taper.  Will also check a uric acid level.  Will add prilosec for GI prophylaxis since patient is on coumadin.    Orders: Prescription Created Electronically [phone])  Problem # 2:  HYPERTENSION (ICD-401.9) Assessment: Deteriorated Will have patient bring his meter in for a nurse visit to make sure that it is accurate.  If  so, then we are likely looking at a white coat HTN and I would be less inclined to increase his medications.   His updated medication list for this problem includes:    Losartan Potassium 100 Mg Tabs (Losartan potassium) ..... One tablet by mouth daily    Enalapril Maleate 10 Mg Tabs (Enalapril maleate) .[name]... Take one tablet two times a day    Diltiazem Hcl 60 Mg Tabs (Diltiazem hcl) .[name]... Take two tablet bid    Triamterene-hctz 37.5-25 Mg Tabs (Triamterene-hctz) ..... One tablet by mouth daily  Orders: TLB-BMP (Basic Metabolic Panel-BMET) (99991111)  Complete Medication List: 1)  Losartan Potassium 100 Mg Tabs (Losartan potassium) .... One tablet by mouth daily 2)  Enalapril Maleate 10 Mg Tabs (Enalapril maleate) .... Take one tablet two times a day 3)  Diltiazem Hcl  60 Mg Tabs (Diltiazem hcl) .... Take two tablet bid 4)  Warfarin Sodium 7.5 Mg Tabs (Warfarin sodium) .... Use as directed by anticoagulation clinic 5)  Colcrys 0.6 Mg Tabs (Colchicine) .... Take 2 tabs by mouth at start of gout flare, and 1 tablet 3 hours later.  (max 3 tabs total per gout attack) 6)  Allopurinol 100 Mg Tabs (Allopurinol) .... Two tablets by mouth daily 7)  Viagra 100 Mg Tabs (Sildenafil citrate) .... One tablet by mouth 1/2 hour prior to sexual activity 8)  Triamterene-hctz 37.5-25 Mg Tabs (Triamterene-hctz) .... One tablet by mouth daily 9)  Percocet 5-325 Mg Tabs (Oxycodone-acetaminophen) .... Take 2 tablest every four hours as needed for pain 10)  Methocarbamol 500 Mg Tabs (Methocarbamol) .... Take 1 tablet by mouth every 6 hours if needed. 11)  Prednisone 10 Mg Tabs (Prednisone) .... Take 4 tablets daily x 2 days, then 3 tabs daily x 2 days, then 2 tabs daily x 2 days, then 1 tab daily for 2 days then stop. 12)  Prilosec Otc 20 Mg Tbec (Omeprazole magnesium) .... One tablet by mouth daily x 3 weeks  Other Orders: T-Uric Acid (Blood) [id number])  Patient Instructions: 1)  Please schedule a nurse visit at your earliest convenience for a nurse visit.  Bring your BP cuff to this visit.  2)  Complete your blood work downstairs today. Prescriptions: PREDNISONE 10 MG TABS (PREDNISONE) take 4 tablets daily x 2 days, then 3 tabs daily x 2 days, then 2 tabs daily x 2 days, then 1 tab daily for 2 days then stop.  #20 x 0   Entered and Authorized by:   Nance Pear FNP   Signed by:   Nance Pear FNP on 04/27/2010   Method used:   Electronically to        [facility] [phone]* (retail)       [address]       [place], [place]  [address]       Ph: [phone]       Fax: [phone]   RxID:   [id number]   Current Allergies (reviewed today): No known allergies

## 2010-08-17 NOTE — Letter (Signed)
   [place] [facility] [place], [place]  [address]  [place] Phone: [phone]      July 30, 2009   [facility] [address] [place], [place] [address]  RE:  LAB RESULTS  Dear  Bruce Cole,  The following is an interpretation of your most recent lab tests.  Please take note of any instructions provided or changes to medications that have resulted from your lab work.  ELECTROLYTES:  Good - no changes needed  KIDNEY FUNCTION TESTS:  Good - no changes needed  Your blood sugar is just slightly elevated.  Please avoid concentrated sweets.    Please make the following nutritional changes: 1.  Avoid white bread, white pasta and white rice 2.  Avoid high fructose corn syrup, juices, sodas 3.  Instead eat brown carbs- Yams, Wheat pasta, whole grained breads, wild rice.  4. Increase your intake of healthy vegetables,  and reduce your portions of carbohydrates. We will repeat your blood work in 3 months.  Return in 3 months for a follow up fasting lipid profile    Sincerely Yours,    Nance Pear FNP

## 2010-08-17 NOTE — Progress Notes (Signed)
  Faxed LOv,12 lead over to Michelle/[facility] to fax [phone] [facility]  October 15, 2009 3:11 PM

## 2010-08-17 NOTE — Letter (Signed)
Summary: Primary Care Consult Scheduled Letter  [place] [facility] [place]   [place], [place] [address]   Phone: [phone]  Fax: [phone]      07/23/2009 MRN: [id number]  [facility] [address] [place], [place]  [address]    Dear Mr. Idler,    We have scheduled an appointment for you.  At the recommendation of Debbrah Alar FNP, we have scheduled you a consult with Dr. Verl Blalock with [facility] on 08-14-2009 at 9:30am.  Their address is [address]rd floor, [place] [address]. The office phone number is [phone].  If this appointment day and time is not convenient for you, please feel free to call the office of the doctor you are being referred to at the number listed above and reschedule the appointment.    It is important for you to keep your scheduled appointments. We are here to make sure you are given good patient care.   Thank you,    Renee, Patient Care Coordinator [place] [facility] [place]    **IF YOU ARE UNABLE TO KEEP THIS APPOINTMENT, OR NEED TO RESCHEDULE, PLEASE GIVE DR. PATTERSON'S OFFICE A 24 HOUR NOTICE TO AVOID A $50 FEE**

## 2010-08-17 NOTE — Assessment & Plan Note (Signed)
Summary: 1 month follow up/mhf   Vital Signs:  Patient profile:   70 year old male Weight:      294.13 pounds BMI:     36.90 Temp:     97.6 degrees F oral Pulse rate:   76 / minute Pulse rhythm:   regular Resp:     20 per minute BP sitting:   126 / 76  (left arm) Cuff size:   large  Vitals Entered By: Kelle Darting CMA (September 21, 2009 10:17 AM) Comments Needs surgical clearance for right knee surgery. [place] like rx for Viagra. Pharmacy told pt. Colchicine is no longer available. Pt would like another alternative?   Primary Care Provider:  Nance Pear FNP   History of Present Illness: Bruce Cole is a 70 yr old male who presents today for follow up.  Notes that he did have a gout flare in his right ankle last week.  This is now resolved.  Orthopedics- sees Dr. Joni Fears and is requesting surgical clearance.  Notes that he has severe pain in his right knee which impacts his mobility.  ED- tells me that his previous physician was prescribing viagra.  He is requesting refill.   Has been on 100mg  doses- this has been working fine for him.  AF- saw Dr. Cristopher Peru, stable on current meds.    Allergies (verified): No Known Drug Allergies  Past History:  Past Medical History: Last updated: 07/22/2009 Nephrolithiasis, hx of ? Rheumatoid arthritis Colon polyps- done 2000 Atrial fibrillation Hypertension Gout  Past Surgical History: Last updated: 07/22/2009 Kidney Stones Spinal Surgery x2 Total hip replacement-L hip Cholecystectomy-1994  Family History: Last updated: 07/22/2009 CAD-no HTN-mother,father DM-no STROKE-no COLON CA-no PROSTATE CA-no  Mom-OA (?RA) Dad- ?RA brother- died in [place] brother A and W Sister deceased following complications for a stroke in her 83s  3 children alive and well  Social History: Last updated: 08/25/2009 Never Smoked Alcohol use-yes-occassional Drug use-no Former [employer] [place] [employer] and [place] divorced  Risk Factors: Smoking Status: never (07/22/2009)  Family History: Reviewed history from 07/22/2009 and no changes required. CAD-no HTN-mother,father DM-no STROKE-no COLON CA-no PROSTATE CA-no  Mom-OA (?RA) Dad- ?RA brother- died in [place] brother A and W Sister deceased following complications for a stroke in her 20s  3 children alive and well  Social History: Reviewed history from 08/25/2009 and no changes required. 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 Lungs are clear to auscultation, no crackles or wheezes. Heart:  irregular rate and rythm Extremities:  No clubbing, cyanosis, edema, or deformity noted with normal full range of motion of all joints.     Impression & Recommendations:  Problem # 1:  HYPERTENSION (ICD-401.9) Assessment Improved BP is stable, will start check BMET His updated medication list for this problem includes:    Losartan Potassium 100 Mg Tabs (Losartan potassium) ..... One tablet by mouth daily    Enalapril Maleate 10 Mg Tabs (Enalapril maleate) .[name]... Take one tablet two times a day    Diltiazem Hcl 60 Mg Tabs (Diltiazem hcl) .[name]... Take two tablet bid  Orders: TLB-BMP (Basic Metabolic Panel-BMET) ([id number])  BP today: 126/76 Prior BP: 146/80 (08/26/2009)  Labs Reviewed: K+: 3.8 (08/26/2009) Creat: : 1.37 (08/26/2009)     Problem # 2:  ERECTILE DYSFUNCTION, ORGANIC [id number]) Assessment: Unchanged  His updated medication list for this problem includes:    Viagra 100 Mg Tabs (Sildenafil citrate) ..... One tablet by mouth 1/2 hour prior to sexual activity  Problem # 3:  PREOPERATIVE EXAMINATION (ICD-V72.84) Will need to hold coumadin for 4 days  prior to surgery with plan for lovenox on day 2 and 1 perioperatively,  resume coumadin on post-op day 1.  This is per recommendation of Dr.  Cristopher Peru. (cardiology notes reviewed)  Plan to check CBC today, BMET,  Patient will also need chest x-ray.  Ua/culture.   Orders: Specimen Handling (99000) CXR- 2view (CXR) TLB-CBC Platelet - w/Differential ([id number]-[id number])  Complete Medication List: 1)  Losartan Potassium 100 Mg Tabs (Losartan potassium) .... One tablet by mouth daily 2)  Enalapril Maleate 10 Mg Tabs (Enalapril maleate) .... Take one tablet two times a day 3)  Diltiazem Hcl 60 Mg Tabs (Diltiazem hcl) .... Take two tablet bid 4)  Warfarin Sodium 7.5 Mg Tabs (Warfarin sodium) .... Use as directed by anticoagulation clinic 5)  Prednisone 10 Mg Tabs (Prednisone) .... Take as directed 6)  Colcrys 0.6 Mg Tabs (Colchicine) .... Take as needed for gout flare 7)  Allopurinol 100 Mg Tabs (Allopurinol) .... One tablet by mouth daily 8)  Viagra 100 Mg Tabs (Sildenafil citrate) .... One tablet by mouth 1/2 hour prior to sexual activity  Other Orders: Venipuncture [id number]) T-Culture, Urine [id number]) TLB-Uric Acid, Blood ([id number]-[id number])  Patient Instructions: 1)  Please follow up in April- sooner if questions or concerns Prescriptions: COLCRYS 0.6 MG TABS (COLCHICINE) take as needed for gout flare  #20 x 0   Entered and Authorized by:   Nance Pear FNP   Signed by:   Nance Pear FNP on 09/21/2009   Method used:   Electronically to        [facility] [phone]* (retail)       [address]       [place], [place]  [address]       Ph: [phone]       Fax: [phone]   RxID:   [id number] VIAGRA 100 MG TABS (SILDENAFIL CITRATE) one tablet by mouth 1/2 hour prior to sexual activity  #6 x 2   Entered and Authorized by:   Nance Pear FNP   Signed by:   Nance Pear FNP on 09/21/2009   Method used:   Electronically to        [facility]  [phone]* (retail)       [address]       [place], [place]  [address]       Ph: [phone]       Fax: [phone]   RxID:   [id number]   Current Allergies (reviewed today): No known allergies    Vital Signs:  Patient Profile:   70 year old male Height:     75 inches Weight:      294.13 pounds BMI:     36.90 Temp:     97.6 degrees F oral Pulse rate:   76 / minute Pulse rhythm:   regular Resp:     20 per minute BP sitting:   126 / 76 Cuff size:   large                 Laboratory Results   Urine Tests    Routine Urinalysis   Color: orange Appearance: Clear Glucose: negative   (Normal Range: Negative) Bilirubin: negative   (Normal Range:  Negative) Ketone: negative   (Normal Range: Negative) Spec. Gravity: 1.010   (Normal Range: 1.003-1.035) Blood: negative   (Normal Range: Negative) pH: 5.0   (Normal Range: 5.0-8.0) Protein: 30   (Normal Range: Negative) Urobilinogen: 0.2   (Normal Range: 0-1) Nitrite: negative   (Normal Range: Negative) Leukocyte Esterace: negative   (Normal Range: Negative)       Appended Document: 1 month follow up/mhf Urine culture is negative and CXR is negative, cleared by cardiology.  Patient is stable from a medical standpoint to proceed with surgery.    Appended Document: 1 month follow up/mhf Office note/clearance faxed to Dr. Rudene Anda office at 9:15a.m. Tf,cma

## 2010-08-17 NOTE — Medication Information (Signed)
Summary: rov/sl  Anticoagulant Therapy  Managed by: Freddrick March, RN, BSN Referring MD: Cristopher Peru, MD PCP: Nance Pear FNP Supervising MD: Olevia Perches MD, Bruce Indication 1: Atrial fibrillation Lab Used: [facility] Site: [place] INR POC 2.9 INR RANGE 2-3  Dietary changes: no    Health status changes: no    Bleeding/hemorrhagic complications: no    Recent/future hospitalizations: no    Any changes in medication regimen? no    Recent/future dental: no  Any missed doses?: no       Is patient compliant with meds? yes       Allergies: No Known Drug Allergies  Anticoagulation Management History:      The patient is taking warfarin and comes in today for a routine follow up visit.  Positive risk factors for bleeding include an age of 69 years or older.  The bleeding index is 'intermediate risk'.  Positive CHADS2 values include History of HTN.  Negative CHADS2 values include Age > 66 years old.  His last INR was 2.8.  Anticoagulation responsible provider: Olevia Perches MD, Darnell Level.  INR POC: 2.9.  Cuvette Lot#: [id number].  Exp: 05/2011.    Anticoagulation Management Assessment/Plan:      The patient's current anticoagulation dose is Warfarin sodium 7.5 mg tabs: Use as directed by Anticoagulation Clinic.  The target INR is 2.0-3.0.  The next INR is due 06/18/2010.  Anticoagulation instructions were given to patient.  Results were reviewed/authorized by Freddrick March, RN, BSN.  He was notified by Freddrick March RN.         Prior Anticoagulation Instructions: INR 2.3  Continue taking Coumadin 7.5 mg on all days except Coumadin 10 mg on Thursdays. Return to clinic in 4 weeks.    Current Anticoagulation Instructions: INR 2.9  Continue on same dosage 7.5mg  daily except 10mg  on Thursdays.  Recheck in 4 weeks.

## 2010-08-17 NOTE — Progress Notes (Signed)
Summary: refill clarification  Phone Note Outgoing Call   Call placed by: Kelle Darting, [place] Call placed to: Patient Summary of Call: Received refill request for Diovan HCT.  Per last office note medications were clarified and pt. was not to be taking this.  Did pt. request med refill from pharmacy?  Need to verify that pt. is not taking this med.  Left message on machine to return call.    Kelle Darting CMA  October 27, 2009 10:20 AM   Follow-up for Phone Call        Left message on machine to return call for clarification of med.  Kelle Darting CMA  October 28, 2009 9:28 AM   Unable to reach pt. by phone.  Mailed letter advising pt. that Diovan HCT request was denied as he is not supposed to be taking this med any longer and to call with any questions.  Kelle Darting CMA  October 29, 2009 8:54 AM

## 2010-08-17 NOTE — Progress Notes (Signed)
  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  Plan to initiate suppressive therapy to decrease risk of transmission to partner.  Also advised condoms all the time and avoid sexual contact if prodromal symptoms or active lesions.  Pt verbalizes understanding.   Initial call taken by: Nance Pear FNP,  June 04, 2010 10:02 AM    New/Updated Medications: VALACYCLOVIR HCL 500 MG TABS (VALACYCLOVIR HCL) one tablet by mouth once daily Prescriptions: VALACYCLOVIR HCL 500 MG TABS (VALACYCLOVIR HCL) one tablet by mouth once daily  #30 x 5   Entered and Authorized by:   Nance Pear FNP   Signed by:   Nance Pear FNP on 06/04/2010   Method used:   Electronically to        [facility] [phone]* (retail)       [address]       [place], [place]  [address]       Ph: [phone]       Fax: [phone]   RxID:   [id number]

## 2010-08-17 NOTE — Medication Information (Signed)
Summary: rov/eac  Anticoagulant Therapy  Managed by: Gypsy Lore, PharmD Referring MD: Cristopher Peru, MD PCP: Nance Pear FNP Supervising MD: Angelena Form MD, Harrell Gave Indication 1: Atrial fibrillation Lab Used: [facility] Site: [place] [facility] INR POC 2.3 INR RANGE 2-3  Dietary changes: no    Health status changes: no    Bleeding/hemorrhagic complications: no    Recent/future hospitalizations: no    Any changes in medication regimen? no    Recent/future dental: no  Any missed doses?: no       Is patient compliant with meds? yes       Current Medications (verified): 1)  Losartan Potassium 100 Mg Tabs (Losartan Potassium) .... One Tablet By Mouth Daily 2)  Enalapril Maleate 10 Mg Tabs (Enalapril Maleate) .... Take One Tablet Two Times A Day 3)  Diltiazem Hcl 60 Mg Tabs (Diltiazem Hcl) .... Take Two Tablet Bid 4)  Warfarin Sodium 7.5 Mg Tabs (Warfarin Sodium) .... Use As Directed By Anticoagulation Clinic 5)  Colcrys 0.6 Mg Tabs (Colchicine) .... Take 2 Tabs By Mouth At Start of Gout Flare, and 1 Tablet 3 Hours Later.  (Max 3 Tabs Total Per Gout Attack) 6)  Allopurinol 100 Mg Tabs (Allopurinol) .... Two Tablets By Mouth Daily 7)  Viagra 100 Mg Tabs (Sildenafil Citrate) .... One Tablet By Mouth 1/2 Hour Prior To Sexual Activity 8)  Triamterene-Hctz 37.5-25 Mg Tabs (Triamterene-Hctz) .... One Tablet By Mouth Daily 9)  Percocet 5-325 Mg Tabs (Oxycodone-Acetaminophen) .... Take 2 Tablest Every Four Hours As Needed For Pain 10)  Methocarbamol 500 Mg Tabs (Methocarbamol) .... Take 1 Tablet By Mouth Every 6 Hours If Needed. 11)  Indomethacin 25 Mg Caps (Indomethacin) .... One Tablet Twice Daily As Needed For Severe Gout Pain Only  Allergies (verified): No Known Drug Allergies  Anticoagulation Management History:      The patient is taking warfarin and comes in today for a routine follow up visit.  Positive risk factors for bleeding include an age of  68 years or older.  The bleeding index is 'intermediate risk'.  Positive CHADS2 values include History of HTN.  Negative CHADS2 values include Age > 102 years old.  His last INR was 2.8.  Anticoagulation responsible Bruce Cole: Angelena Form MD, Harrell Gave.  INR POC: 2.3.  Cuvette Lot#: [id number].  Exp: 04/2011.    Anticoagulation Management Assessment/Plan:      The patient's current anticoagulation dose is Warfarin sodium 7.5 mg tabs: Use as directed by Anticoagulation Clinic.  The target INR is 2.0-3.0.  The next INR is due 05/19/2010.  Anticoagulation instructions were given to patient.  Results were reviewed/authorized by Gypsy Lore, PharmD.  He was notified by Gypsy Lore PharmD.         Prior Anticoagulation Instructions: INR 2.5  Continue taking 7.5 mg everyday, except take 10 mg on Thursday.  Return to clinic in 4 weeks.    Current Anticoagulation Instructions: INR 2.3  Continue taking Coumadin 7.5 mg on all days except Coumadin 10 mg on Thursdays. Return to clinic in 4 weeks.

## 2010-08-17 NOTE — Progress Notes (Signed)
Summary: arthritis?  Phone Note Call from Patient Call back at [phone]   Caller: Patient Call For: Nance Pear FNP Summary of Call: Pt left voice message stating his arthritis pain has flared up again. He is requesting Rx for inflammation or pain be sent to his pharmacy. Please advise. Gilmore Laroche Fergerson CMA Deborra Medina)  May 31, 2010 10:39 AM   Follow-up for Phone Call        Pt called stating he is alot of more ,needs meds called in or he will have to go to ED Follow-up by: Jiles Garter,  May 31, 2010 12:18 PM  Additional Follow-up for Phone Call Additional follow up Details #1::        Notes that he is having severe pain in both wrists 2 weeks ago.  Started in ankles.  Instructed patient to come in to be seen this afternoon.  Will also send rx to his pharmacy for pain.  Not clear if his symptoms are due to gout or RA. Additional Follow-up by: Nance Pear FNP,  May 31, 2010 12:25 PM    New/Updated Medications: PERCOCET 5-325 MG TABS (OXYCODONE-ACETAMINOPHEN) Take 1 tablet every four hours as needed for pain Prescriptions: PERCOCET 5-325 MG TABS (OXYCODONE-ACETAMINOPHEN) Take 1 tablet every four hours as needed for pain  #20 x 0   Entered and Authorized by:   Nance Pear FNP   Signed by:   Nance Pear FNP on 05/31/2010   Method used:   Telephoned to ...       [facility] [phone]* (retail)       [address]       [place], [place]  [address]       Ph: [phone]       Fax: [phone]   RxID:   [id number]

## 2010-08-17 NOTE — Medication Information (Signed)
Summary: rov/kb  Anticoagulant Therapy  Managed by: Tula Nakayama, RN, BSN Referring MD: Cristopher Peru, MD PCP: Nance Pear FNP Supervising MD: Stanford Breed MD, Aaron Edelman Indication 1: Atrial fibrillation Lab Used: [facility] Site: [place] INR POC 3.2 INR RANGE 2-3  Dietary changes: yes       Details: maybe less green leafy veggies  Health status changes: yes       Details: Gout Attack  Bleeding/hemorrhagic complications: no    Recent/future hospitalizations: no    Any changes in medication regimen? yes       Details: Using Chochicine for gout at present and also using more Oxycotin PRN  Recent/future dental: no  Any missed doses?: no       Is patient compliant with meds? yes       Allergies: No Known Drug Allergies  Anticoagulation Management History:      The patient is taking warfarin and comes in today for a routine follow up visit.  Positive risk factors for bleeding include an age of 9 years or older.  The bleeding index is 'intermediate risk'.  Positive CHADS2 values include History of HTN.  Negative CHADS2 values include Age > 15 years old.  His last INR was 2.8.  Anticoagulation responsible provider: Stanford Breed MD, Aaron Edelman.  INR POC: 3.2.  Cuvette Lot#: [id number].  Exp: 03/2011.    Anticoagulation Management Assessment/Plan:      The patient's current anticoagulation dose is Warfarin sodium 7.5 mg tabs: Use as directed by Anticoagulation Clinic.  The target INR is 2.0-3.0.  The next INR is due 01/27/2010.  Anticoagulation instructions were given to patient.  Results were reviewed/authorized by Tula Nakayama, RN, BSN.  He was notified by Tula Nakayama, RN, BSN.         Prior Anticoagulation Instructions: INR-2.5 Resume normal dosing schedule. Take 7.5mg  daily except take 10mg  on Thursday of each week.Return in  3 weeks.  Current Anticoagulation Instructions: INR 3.2 Skip today's dose then resume 7.5mg s daily except 10mg s on Thursdays.  Recheck  in 3 weeks.

## 2010-08-17 NOTE — Letter (Signed)
   [place] [facility] [place], [place]  [address]  [place] Phone: [phone]      Nov 24, 2009   [facility] [address] [place], [place] [address]  RE:  LAB RESULTS  Dear  Mr. Draheim,  The following is an interpretation of your most recent lab tests.  Please take note of any instructions provided or changes to medications that have resulted from your lab work.  ELECTROLYTES:  Good - no changes needed  KIDNEY FUNCTION TESTS:  Good - no changes needed  DIABETIC STUDIES:  Improved - continue management Blood Glucose: 94   HgbA1C: 6.1     CBC:  Stable - no changes needed Your blood count is coming up as it should following your surgery.  Your diabetic tests are improving (A1C was 6.3 now down to 6.1)  Keep up the good work!   Sincerely Yours,    Nance Pear FNP

## 2010-08-17 NOTE — Letter (Signed)
Summary: [facility] Instructions  [facility]  [place], [place] [address]   Phone: [phone]  Fax: [phone]       Bruce Cole    1941/02/05    MRN: [id number]        Procedure Day /Date: Monday, 09/07/09     Arrival Time: 12:30         Procedure Time: 1:30     Location of Procedure:                    Bruce Cole  [place] (4th Floor)                        [place]   Starting 5 days prior to your procedure 09/02/09 do not eat nuts, seeds, popcorn, corn, beans, peas,  salads, or any raw vegetables.  Do not take any fiber supplements (e.g. Metamucil, Citrucel, and Benefiber).  THE DAY BEFORE YOUR PROCEDURE         DATE: 09/06/09  DAY: Sunday  1.  Drink clear liquids the entire day-NO SOLID FOOD  2.  Do not drink anything colored red or purple.  Avoid juices with pulp.  No orange juice.  3.  Drink at least 64 oz. (8 glasses) of fluid/clear liquids during the day to prevent dehydration and help the prep work efficiently.  CLEAR LIQUIDS INCLUDE: Water Jello Ice Popsicles Tea (sugar ok, no milk/cream) Powdered fruit flavored drinks Coffee (sugar ok, no milk/cream) Gatorade Juice: apple, white grape, white cranberry  Lemonade Clear bullion, consomm, broth Carbonated beverages (any kind) Strained chicken noodle soup Hard Candy                             4.  In the morning, mix first dose of MoviPrep solution:    Empty 1 Pouch A and 1 Pouch B into the disposable container    Add lukewarm drinking water to the top line of the container. Mix to dissolve    Refrigerate (mixed solution should be used within 24 hrs)  5.  Begin drinking the prep at 5:00 p.m. The MoviPrep container is divided by 4 marks.   Every 15 minutes drink the solution down to the next mark (approximately 8 oz) until the full liter is complete.   6.  Follow completed prep with 16 oz of clear liquid of your choice (Nothing  red or purple).  Continue to drink clear liquids until bedtime.  7.  Before going to bed, mix second dose of MoviPrep solution:    Empty 1 Pouch A and 1 Pouch B into the disposable container    Add lukewarm drinking water to the top line of the container. Mix to dissolve    Refrigerate  THE DAY OF YOUR PROCEDURE      DATE: 09/07/09  DAY: Monday  Beginning at 8:30 a.m. (5 hours before procedure):         1. Every 15 minutes, drink the solution down to the next mark (approx 8 oz) until the full liter is complete.  2. Follow completed prep with 16 oz. of clear liquid of your choice.    3. You may drink clear liquids until 11:30  (2 HOURS BEFORE PROCEDURE).   MEDICATION INSTRUCTIONS  Unless otherwise instructed, you should take regular prescription medications with a small sip of water   as early as possible  the morning of your procedure.      Stop taking Coumadin on  09/02/09  (5 days before procedure).           OTHER INSTRUCTIONS  You will need a responsible adult at least 68 years of age to accompany you and drive you home.   This person must remain in the waiting room during your procedure.  Wear loose fitting clothing that is easily removed.  Leave jewelry and other valuables at home.  However, you may wish to bring a book to read or  an iPod/MP3 player to listen to music as you wait for your procedure to start.  Remove all body piercing jewelry and leave at home.  Total time from sign-in until discharge is approximately 2-3 hours.  You should go home directly after your procedure and rest.  You can resume normal activities the  day after your procedure.  The day of your procedure you should not:   Drive   Make legal decisions   Operate machinery   Drink alcohol   Return to work  You will receive specific instructions about eating, activities and medications before you leave.    The above instructions have been reviewed and explained to me by    _______________________    I fully understand and can verbalize these instructions _____________________________ Date _________

## 2010-08-17 NOTE — Letter (Signed)
   [facility] [place], [place]  [address]  [place] Phone: [phone]      September 21, 2009   [facility] [address] [place],  [address]  RE:  LAB RESULTS  Dear  Bruce Cole,  The following is an interpretation of your most recent lab tests.  Please take note of any instructions provided or changes to medications that have resulted from your lab work.  ELECTROLYTES:  Good - no changes needed  KIDNEY FUNCTION TESTS:  Good - no changes needed   CBC:  Good - no changes needed Uric acid is normal.   Sincerely Yours,    [place]

## 2010-08-17 NOTE — Progress Notes (Signed)
Summary: anti-inflammatory  Phone Note Call from Patient Call back at [phone]   Caller: Patient Call For: Nance Pear FNP Summary of Call: Received call from pt requesting anti-inflammatory for his Arthritis flare up.  Pt states the Percocet helps with the pain but he needs something for the inflammation.  He thought we were going to call in a Prednisone rx? Please advise. Gilmore Laroche Fergerson CMA Deborra Medina)  June 03, 2010 11:33 AM   Follow-up for Phone Call        Please let pt know that I sent rx to his pharmacy for prednisone. Follow-up by: Nance Pear FNP,  June 04, 2010 8:33 AM  Additional Follow-up for Phone Call Additional follow up Details #1::        Left message on home phone that rx was sent to pharmacy and to call if any questions. Gilmore Laroche Fergerson CMA ([place])  June 04, 2010 9:05 AM     New/Updated Medications: PREDNISONE 10 MG TABS (PREDNISONE) 4 tabs by mouth once daily for 4 days Prescriptions: PREDNISONE 10 MG TABS (PREDNISONE) 4 tabs by mouth once daily for 4 days  #16 x 0   Entered and Authorized by:   Nance Pear FNP   Signed by:   Nance Pear FNP on 06/04/2010   Method used:   Electronically to        [facility] [phone]* (retail)       [address]       [place], [place]  [address]       Ph: [phone]       Fax: [phone]   RxID:   [id number]

## 2010-08-17 NOTE — Assessment & Plan Note (Signed)
Summary: 3 MONTH FOLLOW UP/MHF--Rm 5   Vital Signs:  Patient profile:   70 year old male Height:      75 inches Weight:      256.50 pounds BMI:     32.18 Temp:     97.5 degrees F oral Pulse rate:   60 / minute Pulse rhythm:   regular Resp:     16 per minute BP sitting:   110 / 70  (left arm) Cuff size:   large  Vitals Entered By: Kelle Darting CMA Deborra Medina) (June 23, 2010 8:18 AM) CC: Pt here for 3 month f/u. States arthritis has returned and methocarbamol is not helping; wants refill on Prednisone. Is Patient Diabetic? No Pain Assessment Patient in pain? yes     Location: left ankle Comments Pt has completed Prilosec,Percocet and Prednisone. Pt agrees all other med doses and directions are correct. Gilmore Laroche Fergerson CMA Deborra Medina)  June 23, 2010 8:29 AM    Primary Care Esaiah Wanless:  Nance Pear FNP  CC:  Pt here for 3 month f/u. States arthritis has returned and methocarbamol is not helping; wants refill on Prednisone.[name]  History of Present Illness: patient is a 70 year old male who presents today for routine followup. He does have a chief complaint of left ankle pain.   1. Left ankle pain-patient reports that his left ankle is nonswollen he was previously red and warm. He remains stiff. He believes that this is his rheumatoid arthritis acting up again. He notes that when he does have acute gout exacerbations he is unable to bear weight on his foot which is not take today.Has upcoming apt with Dr. Estanislado Pandy.  2. ED- the patient has been treated with Viagra. He is requesting a change of prescription to Cialis. He reports that he has used both the 10 and 20 mg Cialis in the past and that he achieves a better response with a Cialis 20 mg.  3. HTN- no peripheral edema or headaches.  Feels well.  Allergies (verified): No Known Drug Allergies  Past History:  Past Medical History: Last updated: 07/22/2009 Nephrolithiasis, hx of ? Rheumatoid arthritis Colon polyps-  done 2000 Atrial fibrillation Hypertension Gout  Past Surgical History: Last updated: 11/23/2009 Kidney Stones Spinal Surgery x2 Total hip replacement-L hip Cholecystectomy-1994 Right Knee Replacement--10/20/09  Review of Systems       see HPI  Physical Exam  General:  Well-developed,well-nourished,in no acute distress; alert,appropriate and cooperative throughout examination Head:  Normocephalic and atraumatic without obvious abnormalities. No apparent alopecia or balding. Lungs:  Normal respiratory effort, chest expands symmetrically. Lungs are clear to auscultation, no crackles or wheezes. Heart:  Normal rate and regular rhythm. S1 and S2 normal without gallop, murmur, click, rub or other extra sounds. Extremities:  left ankle is noted to be slightly edematous but nontender to the touch. There is no associated erythema today.   Impression & Recommendations:  Problem # 1:  RHEUMATOID ARTHRITIS (ICD-714.0) Assessment Deteriorated Will plan to treat with short prednisone taper.  Will also notify Dr. Estanislado Pandy of patient's recent flare ups.  Problem # 2:  ERECTILE DYSFUNCTION, ORGANIC (ICD-607.84) Assessment: Comment Only  Patient notes that he had better response in the past with Cialis 20 (10 mg doses did not work well for him- "too slow") His updated medication list for this problem includes:    Cialis 20 Mg Tabs (Tadalafil) ..... One tablet by mouth 1 hour prior to sexual activity  Orders: Prescription Created Electronically [phone])  Problem #  3:  HYPERTENSION (ICD-401.9) Assessment: Improved blood pressure is improved today plan to continue same. His updated medication list for this problem includes:    Enalapril Maleate 10 Mg Tabs (Enalapril maleate) .[name]... Take one tablet two times a day    Diltiazem Hcl 60 Mg Tabs (Diltiazem hcl) .[name]... Take two tablet bid    Triamterene-hctz 37.5-25 Mg Tabs (Triamterene-hctz) ..... One tablet by mouth daily    Diovan 160 Mg Tabs  (Valsartan) ..... One tablet by mouth once daily  BP today: 110/70 Prior BP: 160/70 (05/31/2010)  Prior 10 Yr Risk Heart Disease: Not enough information (03/31/2010)  Labs Reviewed: K+: 4.2 (04/27/2010) Creat: : 1.10 (04/27/2010)   Chol: 171 (04/16/2010)   HDL: 68 (04/16/2010)   LDL: 93 (04/16/2010)   TG: 48 (04/16/2010)  Complete Medication List: 1)  Enalapril Maleate 10 Mg Tabs (Enalapril maleate) .... Take one tablet two times a day 2)  Diltiazem Hcl 60 Mg Tabs (Diltiazem hcl) .... Take two tablet bid 3)  Warfarin Sodium 7.5 Mg Tabs (Warfarin sodium) .... Use as directed by anticoagulation clinic 4)  Colcrys 0.6 Mg Tabs (Colchicine) .... Take 2 tabs by mouth at start of gout flare, and 1 tablet 3 hours later.  (max 3 tabs total per gout attack) 5)  Allopurinol 100 Mg Tabs (Allopurinol) .... Two tablets by mouth daily 6)  Cialis 20 Mg Tabs (Tadalafil) .... One tablet by mouth 1 hour prior to sexual activity 7)  Triamterene-hctz 37.5-25 Mg Tabs (Triamterene-hctz) .... One tablet by mouth daily 8)  Methocarbamol 500 Mg Tabs (Methocarbamol) .... Take 1 tablet by mouth every 6 hours if needed. 9)  Diovan 160 Mg Tabs (Valsartan) .... One tablet by mouth once daily 10)  Valacyclovir Hcl 500 Mg Tabs (Valacyclovir hcl) .... One tablet by mouth once daily 11)  Prednisone 10 Mg Tabs (Prednisone) .... Take 4 tablets daily x 2 days, then 3 tabs daily x 2 days, then 2 tabs daily x 2 days, then 1 tab daily for 2 days then stop.  Patient Instructions: 1)  Please follow up 3 months. 2)  Have a nice Holiday! Prescriptions: DILTIAZEM HCL 60 MG TABS (DILTIAZEM HCL) take two tablet bid  #120 Tablet x 3   Entered and Authorized by:   Nance Pear FNP   Signed by:   Nance Pear FNP on 06/23/2010   Method used:   Electronically to        [facility] [phone]* (retail)       [address]       [place], [place]  [address]       Ph: [phone]       Fax: [phone]   RxID:    [id number] ENALAPRIL MALEATE 10 MG TABS (ENALAPRIL MALEATE) take one tablet two times a day  #60 Tablet x 3   Entered and Authorized by:   Nance Pear FNP   Signed by:   Nance Pear FNP on 06/23/2010   Method used:   Electronically to        [facility] [phone]* (retail)       [address]       [place], [place]  [address]       Ph: [phone]       Fax: [phone]   RxID:   [id number] PREDNISONE 10 MG TABS (PREDNISONE) take 4 tablets daily x 2 days, then 3 tabs daily x 2 days, then 2 tabs daily x 2  days, then 1 tab daily for 2 days then stop.  #20 x 0   Entered and Authorized by:   Nance Pear FNP   Signed by:   Nance Pear FNP on 06/23/2010   Method used:   Electronically to        [facility] [phone]* (retail)       [address]       [place], [place]  [address]       Ph: [phone]       Fax: [phone]   RxID:   [id number] CIALIS 20 MG TABS (TADALAFIL) one tablet by mouth 1 hour prior to sexual activity  #6 x 0   Entered and Authorized by:   Nance Pear FNP   Signed by:   Nance Pear FNP on 06/23/2010   Method used:   Print then Give to Patient   RxID:   [id number]    Orders Added: 1)  Est. Patient Level III [id number] 2)  Prescription Created Electronically [phone]    Current Allergies (reviewed today): No known allergies

## 2010-08-17 NOTE — Progress Notes (Signed)
Summary: Add Prilosec OTC  Phone Note Outgoing Call   Summary of Call: Please call patient this afternoon and let him know that I would like him to start OTC prilosec once daily to protect his stomach while he is on the prednisone.  He should continue for a total of 3 weeks.  Initial call taken by: Nance Pear FNP,  April 27, 2010 11:55 AM  Follow-up for Phone Call        Notified pt per [facility] instructions. Pt voices understanding. Gilmore Laroche Fergerson CMA Deborra Medina)  April 27, 2010 3:27 PM

## 2010-08-17 NOTE — Assessment & Plan Note (Signed)
Summary: PT CHECK//PH pt did not need office visit  Nurse Visit  CC: pt came in for ck, pt did not need just had done mon 07/27/09.  Going to [employer] will have pt checked there at [place] on 08/06/09 and faxed here.  Faxed number given.  Pt can be reached at cell # [phone] in [place]   Allergies: No Known Drug Allergies

## 2010-08-17 NOTE — Medication Information (Signed)
Summary: ccr  Anticoagulant Therapy  Managed by: Tula Nakayama, RN, BSN Referring MD: Cristopher Peru, MD PCP: Nance Pear FNP Supervising MD: Haroldine Laws MD, Quillian Quince Indication 1: Atrial fibrillation Lab Used: [facility] Site: [place] INR POC 1.9 INR RANGE 2-3  Dietary changes: yes       Details: Eating more leafy green veggies  Health status changes: yes       Details: Rt knee replacement 10/20/09  Bleeding/hemorrhagic complications: no    Recent/future hospitalizations: no    Any changes in medication regimen? yes       Details: Pain med PRN   Recent/future dental: no  Any missed doses?: no       Is patient compliant with meds? yes      Comments: Been taking 3.75mg s for past 4 days and last 2 days 7.5mg .   Allergies: No Known Drug Allergies  Anticoagulation Management History:      The patient is taking warfarin and comes in today for a routine follow up visit.  Positive risk factors for bleeding include an age of 68 years or older.  The bleeding index is 'intermediate risk'.  Positive CHADS2 values include History of HTN.  Negative CHADS2 values include Age > 69 years old.  His last INR was 2.8.  Anticoagulation responsible provider: Lindsay Soulliere MD, Quillian Quince.  INR POC: 1.9.  Cuvette Lot#: [id number].  Exp: 02/2011.    Anticoagulation Management Assessment/Plan:      The patient's current anticoagulation dose is Warfarin sodium 7.5 mg tabs: Use as directed by Anticoagulation Clinic.  The target INR is 2.0-3.0.  The next INR is due 12/16/2009.  Anticoagulation instructions were given to patient.  Results were reviewed/authorized by Tula Nakayama, RN, BSN.  He was notified by Tula Nakayama, RN, BSN.         Prior Anticoagulation Instructions: INR 2.3 Weight 284 pounds  Take last warfarin (coumadin) dose on Friday, 4/1.  Take nothing on Saturday 4/2.  Begin lovenox (enoxaparin) 120 mg injection subcutaneously two times a day at 8 am on Sunday, 4/3.   Give last injection on Monday 4/4 at 8 am.    My cell phone for questions:  [phone]--Mary [place],PharmD   Current Anticoagulation Instructions: [name] Resume 7.5mg  daily except 10mg  on Thursdays. Recheck in 2 weeks.

## 2010-08-17 NOTE — Assessment & Plan Note (Signed)
Summary: 2 month follow up/mhf--Rm 4   Vital Signs:  Patient profile:   70 year old male Height:      75 inches Weight:      267.25 pounds BMI:     33.52 Temp:     98.0 degrees F oral Pulse rate:   66 / minute Pulse rhythm:   regular Resp:     18 per minute BP sitting:   136 / 90  (left arm) Cuff size:   large  Vitals Entered By: Kelle Darting CMA Deborra Medina) (January 26, 2010 11:06 AM)  Primary Care Provider:  Nance Pear FNP  CC:  Room 4  2 month follow up.  Pt states Endocin worked better for gout pain than Colcrys..  History of Present Illness: Bruce Cole is a 70 year old male who presents today.  1) HTN- reports that he has been watching his diet.  He has lost more than 50 pounds since he started at [place] in  January of this year.  Has been exercising regularly.  2) Hyperglycemia-  Has been only eating whole grained carbs.  Watching portions.    3) Gout- Patient reports a 10 day history of gout pain which started in ankles, knees, and right hip.  He took Colcrys last night with some improvement of his symptoms.  We also increased his allopurinol.  He used to be on Indomethacin daily due to his RA/DJD, however this was stopped due to concerns of increased risk for GI bleed in setting of coumadin.  He is requesting something for his acute gout pain.     Allergies (verified): No Known Drug Allergies  Physical Exam  General:  Well-developed,well-nourished,in no acute distress; alert,appropriate and cooperative throughout examination Lungs:  Normal respiratory effort, chest expands symmetrically. Lungs are clear to auscultation, no crackles or wheezes. Heart:  Normal rate and regular rhythm. S1 and S2 normal without gallop, murmur, click, rub or other extra sounds. Msk:  + swelling right knee (s/p R TKA) No erythema noted   Impression & Recommendations:  Problem # 1:  GOUT (ICD-274.9) Assessment Deteriorated Plan increase allopurinol to 200mg  by mouth daily.   Repeat Colcrys if needed.  Pt given small rx for indomethacin for acute pain only.  Pt was advised not to use for more than a few days.   His updated medication list for this problem includes:    Colcrys 0.6 Mg Tabs (Colchicine) .[name]... Take 2 tabs by mouth at start of gout flare, and 1 tablet 3 hours later.  (max 3 tabs total per gout attack)    Allopurinol 100 Mg Tabs (Allopurinol) .[name]..[name] Two tablets by mouth daily  Problem # 2:  HYPERTENSION (ICD-401.9) Assessment: Improved BP stable, continue current meds. He has been resistant to discontinuation of his diurect despite the fact that this may improve his gout symptoms. His updated medication list for this problem includes:    Losartan Potassium 100 Mg Tabs (Losartan potassium) ..... One tablet by mouth daily    Enalapril Maleate 10 Mg Tabs (Enalapril maleate) .[name]... Take one tablet two times a day    Diltiazem Hcl 60 Mg Tabs (Diltiazem hcl) .[name]... Take two tablet bid    Triamterene-hctz 37.5-25 Mg Tabs (Triamterene-hctz) ..... One tablet by mouth daily  BP today: 136/90 Prior BP: 150/78 (11/23/2009)  Labs Reviewed: K+: 4.2 (11/23/2009) Creat: : 1.01 (11/23/2009)     Problem # 3:  HYPERGLYCEMIA (ICD-790.29) Assessment: Improved Last A1C was 6.1 in May, down from 6.3. I commended  patient on his diet, exercise and weight loss.  Labs Reviewed: Creat: 1.01 (11/23/2009)     Complete Medication List: 1)  Losartan Potassium 100 Mg Tabs (Losartan potassium) .... One tablet by mouth daily 2)  Enalapril Maleate 10 Mg Tabs (Enalapril maleate) .... Take one tablet two times a day 3)  Diltiazem Hcl 60 Mg Tabs (Diltiazem hcl) .... Take two tablet bid 4)  Warfarin Sodium 7.5 Mg Tabs (Warfarin sodium) .... Use as directed by anticoagulation clinic 5)  Colcrys 0.6 Mg Tabs (Colchicine) .... Take 2 tabs by mouth at start of gout flare, and 1 tablet 3 hours later.  (max 3 tabs total per gout attack) 6)  Allopurinol 100 Mg Tabs (Allopurinol) .... Two  tablets by mouth daily 7)  Viagra 100 Mg Tabs (Sildenafil citrate) .... One tablet by mouth 1/2 hour prior to sexual activity 8)  Triamterene-hctz 37.5-25 Mg Tabs (Triamterene-hctz) .... One tablet by mouth daily 9)  Percocet 5-325 Mg Tabs (Oxycodone-acetaminophen) .... Take 2 tablest every four hours as needed for pain 10)  Methocarbamol 500 Mg Tabs (Methocarbamol) .... Take 1 tablet by mouth every 6 hours if needed. 11)  Indomethacin 25 Mg Caps (Indomethacin) .... One tablet twice daily as needed for severe gout pain only  Patient Instructions: 1)  Do not use indocin for more that a few days. 2)  Please follow up fasting 2 months. 3)  Call if your gout symptoms worsen or do not improve. Prescriptions: INDOMETHACIN 25 MG CAPS (INDOMETHACIN) one tablet twice daily as needed for severe gout pain only  #20 x 0   Entered and Authorized by:   Nance Pear FNP   Signed by:   Nance Pear FNP on 01/26/2010   Method used:   Electronically to        [facility] [phone]* (retail)       [address]       [place], [place]  [address]       Ph: [phone]       Fax: [phone]   RxID:   [id number]   Current Allergies (reviewed today): No known allergies   CC: Room 4  2 month follow up.  Pt states Endocin worked better for gout pain than Colcrys. Is Patient Diabetic? No Pain Assessment Patient in pain? yes     Location: ankle, knees and hip Intensity: 8

## 2010-08-17 NOTE — Medication Information (Signed)
Summary: Bruce Cole  Anticoagulant Therapy  Managed by: Margaretha Sheffield, PharmD Referring MD: Cristopher Peru, MD PCP: Nance Pear FNP Supervising MD: Angelena Form MD, Harrell Gave Indication 1: Atrial fibrillation Lab Used: [facility] Site: [place] INR POC 2.5 INR RANGE 2-3  Dietary changes: no    Health status changes: no    Bleeding/hemorrhagic complications: no    Recent/future hospitalizations: no    Any changes in medication regimen? no    Recent/future dental: no  Any missed doses?: no       Is patient compliant with meds? yes       Allergies: No Known Drug Allergies  Anticoagulation Management History:      The patient is taking warfarin and comes in today for a routine follow up visit.  Positive risk factors for bleeding include an age of 90 years or older.  The bleeding index is 'intermediate risk'.  Positive CHADS2 values include History of HTN.  Negative CHADS2 values include Age > 53 years old.  His last INR was 2.8.  Anticoagulation responsible provider: Angelena Form MD, Harrell Gave.  INR POC: 2.5.  Cuvette Lot#: [id number].  Exp: 04/2011.    Anticoagulation Management Assessment/Plan:      The patient's current anticoagulation dose is Warfarin sodium 7.5 mg tabs: Use as directed by Anticoagulation Clinic.  The target INR is 2.0-3.0.  The next INR is due 04/21/2010.  Anticoagulation instructions were given to patient.  Results were reviewed/authorized by Margaretha Sheffield, PharmD.  He was notified by Margaretha Sheffield.         Prior Anticoagulation Instructions: INR 2.6  Continue taking 1 tablet (7.5mg ) every day except take 1, 10mg  tablet on Thursdays.  Recheck in 4 weeks.  Current Anticoagulation Instructions: INR 2.5  Continue taking 7.5 mg everyday, except take 10 mg on Thursday.  Return to clinic in 4 weeks.

## 2010-08-17 NOTE — Assessment & Plan Note (Signed)
Summary: np6/ afib./ pt has medicare uhc/ gd   Visit Type:  Initial Consult Primary Provider:  Nance Pear FNP   History of Present Illness: [name] is referred today for pre-operative evaluation and ongoing followup of atrial fibrillation. 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 He is considering knee replacement surgery. He has previously had hip replacement.  He does not have chest pain, sob or peripheral edema. He has chronic knee pain.  Current Medications (verified): 1)  Triamterene-Hctz 37.5-25 Mg Tabs (Triamterene-Hctz) .... Take One Tablet Daily 2)  Losartan Potassium 100 Mg Tabs (Losartan Potassium) .... One Tablet By Mouth Daily 3)  Enalapril Maleate 10 Mg Tabs (Enalapril Maleate) .... Take One Tablet Two Times A Day 4)  Diltiazem Hcl 60 Mg Tabs (Diltiazem Hcl) .... Take Two Tablet Bid 5)  Indomethacin 50 Mg Caps (Indomethacin) .... Three Times A Day 6)  Warfarin Sodium 7.5 Mg Tabs (Warfarin Sodium) .... Use As Directed By Anticoagulation Clinic  Allergies (verified): No Known Drug Allergies  Past History:  Past Medical History: Last updated: 07/22/2009 Nephrolithiasis, hx of ? Rheumatoid arthritis Colon polyps- done 2000 Atrial fibrillation Hypertension Gout  Past Surgical History: Last updated: 07/22/2009 Kidney Stones Spinal Surgery x2 Total hip replacement-L hip Cholecystectomy-1994  Review of Systems       All systems reviewed and negative except as noted in the HPI.   Vital Signs:  Patient profile:   70 year old male Height:      75 inches Weight:      310 pounds BMI:     38.89 Pulse rate:   54 / minute BP sitting:   160 / 90  (left arm)  Vitals Entered By: Margaretmary Bayley CMA (August 17, 2009 11:08 AM)  Physical Exam  General:  Middle aged well developed, well nourished, in no acute distress.  HEENT: normal Neck: supple. No JVD.  Carotids 2+ bilaterally no bruits Cor: RRR no rubs, gallops or murmur Lungs: CTA with no wheezes, rales, or rhonchi. Ab: soft, nontender. nondistended. No HSM. Good bowel sounds Ext: warm. no cyanosis, clubbing or edema. Right knee is swolled and enlarged. Neuro: alert and oriented. Grossly nonfocal. affect pleasant    EKG  Procedure date:  08/17/2009  Findings:      Atrial fibrillation with a controlled ventricular response rate of: 54 with LAFB.  Impression & Recommendations:  Problem # 1:  PREOPERATIVE EXAMINATION (ICD-V72.84) Despite his arthritis, he remains active and has no symptoms.  He is low risk for major cardiovascular complications with his pending knee surgery.  Problem # 2:  COUMADIN THERAPY (ICD-V58.61) With regard to his pending surgery his thromboembolic risk is fairly low but he has previously used heparin/lovenox for bridging.  To this end I would recommend stopping coumadin 4 days prior to surgery and use lovenox on day 2 and 1 perioperatively followed by reinitiation of coumadin on post op day 1.  Problem # 3:  ATRIAL FIBRILLATION (ICD-427.31) His atrial fibrillation is well controlled and despite his slow rate, he is asymptomatic.  He is low risk for surgery.  There is currently no indication for PPM. The following medications were removed from the medication list:    Coumadin 10 Mg Tabs (Warfarin sodium) .[name]... Take as directed His updated medication list for this problem includes:    Warfarin Sodium 7.5 Mg Tabs (Warfarin sodium) ..... Use as directed by anticoagulation clinic  Orders: EKG w/ Interpretation (  93000)  Patient Instructions: 1)  Your physician recommends that you schedule a follow-up appointment in: 12 months with Dr Lovena Le

## 2010-08-17 NOTE — Medication Information (Signed)
Summary: rov/tm  Anticoagulant Therapy  Managed by: Tula Nakayama, RN, BSN Referring MD: Cristopher Peru, MD PCP: Nance Pear FNP Supervising MD: Lia Foyer MD, Marcello Moores Indication 1: Atrial fibrillation Lab Used: [facility] [place] Site: [place] INR POC 2.5 INR RANGE 2-3  Dietary changes: no    Health status changes: no    Bleeding/hemorrhagic complications: no    Recent/future hospitalizations: no    Any changes in medication regimen? no    Recent/future dental: no  Any missed doses?: no       Is patient compliant with meds? yes       Allergies: No Known Drug Allergies  Anticoagulation Management History:      The patient is taking warfarin and comes in today for a routine follow up visit.  Positive risk factors for bleeding include an age of 68 years or older.  The bleeding index is 'intermediate risk'.  Positive CHADS2 values include History of HTN.  Negative CHADS2 values include Age > 23 years old.  His last INR was 2.8.  Anticoagulation responsible provider: Lia Foyer MD, Marcello Moores.  INR POC: 2.5.  Cuvette Lot#: [id number].  Exp: 02/2011.    Anticoagulation Management Assessment/Plan:      The patient's current anticoagulation dose is Warfarin sodium 7.5 mg tabs: Use as directed by Anticoagulation Clinic.  The target INR is 2.0-3.0.  The next INR is due 01/06/2010.  Anticoagulation instructions were given to patient.  Results were reviewed/authorized by Tula Nakayama, RN, BSN.  He was notified by Donnie Mesa B Pharm.         Prior Anticoagulation Instructions: [name] Resume 7.5mg  daily except 10mg  on Thursdays. Recheck in 2 weeks.   Current Anticoagulation Instructions: INR-2.5 Resume normal dosing schedule. Take 7.5mg  daily except take 10mg  on Thursday of each week.Return in  3 weeks.

## 2010-08-17 NOTE — Medication Information (Signed)
Summary: rov/ln  Anticoagulant Therapy  Managed by: Porfirio Oar, PharmD Referring MD: Cristopher Peru, MD PCP: Nance Pear FNP Supervising MD: Stanford Breed MD, Aaron Edelman Indication 1: Atrial fibrillation Lab Used: [facility] Site: [place] INR POC 2.6 INR RANGE 2-3  Dietary changes: no    Health status changes: no    Bleeding/hemorrhagic complications: no    Recent/future hospitalizations: no    Any changes in medication regimen? no    Recent/future dental: no  Any missed doses?: no       Is patient compliant with meds? yes       Allergies: No Known Drug Allergies  Anticoagulation Management History:      The patient is taking warfarin and comes in today for a routine follow up visit.  Positive risk factors for bleeding include an age of 68 years or older.  The bleeding index is 'intermediate risk'.  Positive CHADS2 values include History of HTN.  Negative CHADS2 values include Age > 68 years old.  His last INR was 2.8.  Anticoagulation responsible provider: Stanford Breed MD, Aaron Edelman.  INR POC: 2.6.  Cuvette Lot#: [id number].  Exp: 03/2011.    Anticoagulation Management Assessment/Plan:      The patient's current anticoagulation dose is Warfarin sodium 7.5 mg tabs: Use as directed by Anticoagulation Clinic.  The target INR is 2.0-3.0.  The next INR is due 03/24/2010.  Anticoagulation instructions were given to patient.  Results were reviewed/authorized by Porfirio Oar, PharmD.  He was notified by Vassie Loll, PharmD Candidate.         Prior Anticoagulation Instructions: INR 2.3  Continue same dose of 7.5mg  every day except 10mg  on Thursday.  Re-check INR in 4 weeks.  Current Anticoagulation Instructions: INR 2.6  Continue taking 1 tablet (7.5mg ) every day except take 1, 10mg  tablet on Thursdays.  Recheck in 4 weeks.

## 2010-08-18 ENCOUNTER — Telehealth: Payer: Self-pay | Admitting: Family

## 2010-08-19 NOTE — Progress Notes (Signed)
Summary: Viagra Refill  Phone Note Call from Patient   Caller: Patient Call For: Bruce Cole  Summary of Call: He was going to switch to cialis but is is $50 for 3 pills.  He wants the regular rx for 6 Viagra as that has been working fine and is much cheaper.  [facility]  He is going out of town tomorrow and would like to take the meds with him  Initial call taken by: Shanon Payor,  July 07, 2010 3:55 PM  Follow-up for Phone Call        Rx has been sent. Pls notify pt.  Follow-up by: Nance Pear FNP,  July 07, 2010 4:30 PM  Additional Follow-up for Phone Call Additional follow up Details #1::        call placed to patient at  [phone], he has been informed rx sent to pharmacy. Additional Follow-up by: Jiles Garter CMA,  July 07, 2010 4:37 PM    New/Updated Medications: VIAGRA 100 MG TABS (SILDENAFIL CITRATE) take 1 tablet 30 minutes before sexual activity Prescriptions: VIAGRA 100 MG TABS (SILDENAFIL CITRATE) take 1 tablet 30 minutes before sexual activity  #6 x 5   Entered and Authorized by:   Nance Pear FNP   Signed by:   Nance Pear FNP on 07/07/2010   Method used:   Electronically to        [facility] [phone]* (retail)       [address]       [place], [place]  [address]       Ph: [phone]       Fax: [phone]   RxID:   [id number]

## 2010-08-19 NOTE — Progress Notes (Signed)
Summary: Arthritis Flare Up  Phone Note Call from Patient Call back at [phone]   Caller: Patient Call For: Nance Pear FNP Summary of Call: patient states that he is in [place]  until  the New year, and he has had a bad attack of arthritis in his wrist, and it is swollen. He states he is unable to move it. He would like to know if [name] would prescribe a anti-inflammatroy for him. If approved he is requesting it sent to [facility] [place] Initial call taken by: Jiles Garter CMA,  July 14, 2010 11:58 AM  Follow-up for Phone Call        L wrist pain- no improvement with colchicine.  Previously right wrist was bothering him.  That resolved with prednisone.  Told patient that I will send Rx to his pharmacy for prednisone, but that he needs to follow up with Dr. Estanislado Pandy upon his return.  Pt verbalizes understanding.  Rx called in to the [facility]. Follow-up by: Nance Pear FNP,  July 14, 2010 12:25 PM    New/Updated Medications: PREDNISONE 10 MG TABS (PREDNISONE) take 4 tablets daily x 2 days, then 3 tabs daily x 2 days, then 2 tabs daily x 2 days, then 1 tab daily for 2 days then stop. Prescriptions: PREDNISONE 10 MG TABS (PREDNISONE) take 4 tablets daily x 2 days, then 3 tabs daily x 2 days, then 2 tabs daily x 2 days, then 1 tab daily for 2 days then stop.  #20 x 0   Entered and Authorized by:   Nance Pear FNP   Signed by:   Nance Pear FNP on 07/14/2010   Method used:   Telephoned to ...       [facility] [phone]* (retail)       [address]       [place], [place]  [address]       Ph: [phone]       Fax: [phone]   RxID:   [id number]

## 2010-08-19 NOTE — Medication Information (Signed)
Summary: rov/tp  Anticoagulant Therapy  Managed by: Vanessa [place], PharmD Referring MD: Cristopher Peru, MD PCP: Nance Pear FNP Supervising MD: Harrington Challenger MD, Nevin Bloodgood Indication 1: Atrial fibrillation Lab Used: [facility] Site: [place] INR POC 1.4 INR RANGE 2-3  Dietary changes: no    Health status changes: no    Bleeding/hemorrhagic complications: no    Recent/future hospitalizations: no    Any changes in medication regimen? no    Recent/future dental: no  Any missed doses?: yes     Details: Missed 4 days of Coumadin in the previous week.  Restarted last Friday.    Allergies: No Known Drug Allergies  Anticoagulation Management History:      Positive risk factors for bleeding include an age of 68 years or older.  The bleeding index is 'intermediate risk'.  Positive CHADS2 values include History of HTN.  Negative CHADS2 values include Age > 68 years old.  His last INR was 2.8.  Anticoagulation responsible provider: Harrington Challenger MD, Nevin Bloodgood.  INR POC: 1.4.  Exp: 06/2011.    Anticoagulation Management Assessment/Plan:      The patient's current anticoagulation dose is Warfarin sodium 7.5 mg tabs: Use as directed by Anticoagulation Clinic.  The target INR is 2.0-3.0.  The next INR is due 07/29/2010.  Anticoagulation instructions were given to patient.  Results were reviewed/authorized by Vanessa Dayton, PharmD.         Prior Anticoagulation Instructions: INR 3.3 Skip today's dose then resume 7.5mg s daily except 10mg s on Thursdays. Recheck in 4  weeks.   Current Anticoagulation Instructions: INR:  1.4  Your INR is low today due to the 4 missed doses last week.  Take Coumadin 10 mg today and tomorrow then resume your normal schedule of 7.5 mg everyday except 10 mg on Thursday.  Please return in 1 week for another INR check before your vacation.

## 2010-08-19 NOTE — Medication Information (Signed)
Summary: ROV  Anticoagulant Therapy  Managed by: Tula Nakayama, RN, BSN Referring MD: Cristopher Peru, MD PCP: Nance Pear FNP Supervising MD: Lovena Le MD, Carleene Overlie Indication 1: Atrial fibrillation Lab Used: [facility] Site: [place] INR POC 2.2 INR RANGE 2-3  Dietary changes: no    Health status changes: no    Bleeding/hemorrhagic complications: no    Recent/future hospitalizations: no    Any changes in medication regimen? no    Recent/future dental: no  Any missed doses?: no       Is patient compliant with meds? yes       Allergies: No Known Drug Allergies  Anticoagulation Management History:      The patient is taking warfarin and comes in today for a routine follow up visit.  Positive risk factors for bleeding include an age of 24 years or older.  The bleeding index is 'intermediate risk'.  Positive CHADS2 values include History of HTN.  Negative CHADS2 values include Age > 74 years old.  His last INR was 2.8.  Anticoagulation responsible provider: Lovena Le MD, Carleene Overlie.  INR POC: 2.2.  Cuvette Lot#: [id number].  Exp: 08/2011.    Anticoagulation Management Assessment/Plan:      The patient's current anticoagulation dose is Warfarin sodium 7.5 mg tabs: Use as directed by Anticoagulation Clinic.  The target INR is 2.0-3.0.  The next INR is due 08/26/2010.  Anticoagulation instructions were given to patient.  Results were reviewed/authorized by Tula Nakayama, RN, BSN.  He was notified by Tula Nakayama, RN, BSN.         Prior Anticoagulation Instructions: INR:  1.4  Your INR is low today due to the 4 missed doses last week.  Take Coumadin 10 mg today and tomorrow then resume your normal schedule of 7.5 mg everyday except 10 mg on Thursday.  Please return in 1 week for another INR check before your vacation.   Current Anticoagulation Instructions: INR 2.2 continue 7.5mg s everyday except 10mg s on Thursdays. Recheck in  4weeks.

## 2010-08-25 NOTE — Progress Notes (Signed)
Summary: questions about med  Phone Note Call from Patient   Caller: Patient Call For: tricia Reason for Call: Talk to Nurse Summary of Call: pt called and wants to talk to [facility] about a certain med called methotrexate 2.5 mg for rheumatoid arithritis. pt does not want to start taking med until he talks to her. cell phone# [phone] Initial call taken by: Trenton Gammon,  August 18, 2010 1:42 PM  Follow-up for Phone Call        Returned phone call.  Risks associated with methotrexate were discussed with the patient.  He will discuss further with Dr.  Estanislado Pandy prior to initiating.  Follow-up by: Nance Pear FNP,  August 18, 2010 3:57 PM

## 2010-08-26 ENCOUNTER — Encounter: Payer: Self-pay | Admitting: Internal Medicine

## 2010-08-26 ENCOUNTER — Encounter (INDEPENDENT_AMBULATORY_CARE_PROVIDER_SITE_OTHER): Payer: Medicare Other

## 2010-08-26 DIAGNOSIS — I4891 Unspecified atrial fibrillation: Secondary | ICD-10-CM

## 2010-08-26 DIAGNOSIS — Z7901 Long term (current) use of anticoagulants: Secondary | ICD-10-CM

## 2010-08-26 LAB — CONVERTED CEMR LAB: POC INR: 2

## 2010-09-02 NOTE — Medication Information (Signed)
Summary: Coumadin Clinic  Anticoagulant Therapy  Managed by: Danella Penton, RN Referring MD: Cristopher Peru, MD PCP: Nance Pear FNP Supervising MD: Rayann Heman MD, Jeneen Rinks Indication 1: Atrial fibrillation Lab Used: [facility] Site: [place] INR POC 2.0 INR RANGE 2-3  Dietary changes: no    Health status changes: no    Bleeding/hemorrhagic complications: no    Recent/future hospitalizations: no    Any changes in medication regimen? no    Recent/future dental: no  Any missed doses?: no       Is patient compliant with meds? yes       Allergies: No Known Drug Allergies  Anticoagulation Management History:      The patient is taking warfarin and comes in today for a routine follow up visit.  Positive risk factors for bleeding include an age of 68 years or older.  The bleeding index is 'intermediate risk'.  Positive CHADS2 values include History of HTN.  Negative CHADS2 values include Age > 68 years old.  His last INR was 2.8.  Anticoagulation responsible provider: Cayleb Jarnigan MD, Jeneen Rinks.  INR POC: 2.0.  Cuvette Lot#: [id number].  Exp: 07/2011.    Anticoagulation Management Assessment/Plan:      The patient's current anticoagulation dose is Warfarin sodium 7.5 mg tabs: Use as directed by Anticoagulation Clinic.  The target INR is 2.0-3.0.  The next INR is due 09/23/2010.  Anticoagulation instructions were given to patient.  Results were reviewed/authorized by Danella Penton, RN.  He was notified by Danella Penton, RN.         Prior Anticoagulation Instructions: INR 2.2 continue 7.5mg s everyday except 10mg s on Thursdays. Recheck in  4weeks.   Current Anticoagulation Instructions: INR 2.0 Continue taking 7.5mg  every day except take 10 mg on Thursdays. Recheck in 4 weeks.

## 2010-09-15 ENCOUNTER — Encounter: Payer: Self-pay | Admitting: Internal Medicine

## 2010-09-15 DIAGNOSIS — I4891 Unspecified atrial fibrillation: Secondary | ICD-10-CM

## 2010-09-20 ENCOUNTER — Ambulatory Visit (INDEPENDENT_AMBULATORY_CARE_PROVIDER_SITE_OTHER): Payer: Medicare Other | Admitting: Family

## 2010-09-20 ENCOUNTER — Encounter: Payer: Self-pay | Admitting: Family

## 2010-09-20 DIAGNOSIS — M069 Rheumatoid arthritis, unspecified: Secondary | ICD-10-CM

## 2010-09-20 DIAGNOSIS — I10 Essential (primary) hypertension: Secondary | ICD-10-CM

## 2010-09-20 DIAGNOSIS — A6000 Herpesviral infection of urogenital system, unspecified: Secondary | ICD-10-CM

## 2010-09-20 DIAGNOSIS — Z23 Encounter for immunization: Secondary | ICD-10-CM

## 2010-09-20 LAB — CONVERTED CEMR LAB
Creatinine, Ser: 1.07 mg/dL (ref 0.40–1.50)
Glucose, Bld: 98 mg/dL (ref 70–99)
Potassium: 3.8 meq/L (ref 3.5–5.3)

## 2010-09-23 ENCOUNTER — Ambulatory Visit: Payer: Self-pay | Admitting: Internal Medicine

## 2010-09-28 NOTE — Assessment & Plan Note (Signed)
Summary: 3 month fu/mhf   Vital Signs:  Patient profile:   70 year old male Height:      75 inches Weight:      268 pounds BMI:     33.62 O2 Sat:      99 % on Room air Temp:     98.2 degrees F oral Pulse rate:   55 / minute Resp:     18 per minute BP sitting:   146 / 80  (left arm) Cuff size:   large  Vitals Entered By: Jiles Garter CMA (September 20, 2010 7:59 AM)  O2 Flow:  Room air CC: 3 Month Follow up Comments dicuss arthritis medication-has not started no problems since last visit-   Primary Care Provider:  Nance Pear FNP  CC:  3 Month Follow up.  History of Present Illness: Bruce Cole is a 70 year old male who presents today for f/u.  1) RA-  no flare ups since last visit 3 months ago.  Did not start methotrexate.  He is concerned about the immune suppression.  Wishes to discuss further with Dr. Estanislado Pandy.    2)  HTN-  Denies swelling, headaches or chest pain.   3)HSV- no breakouts.  Felt like valtrex caused joint pain.  Not currently sexually active.  Stopped valtrex for now.     Preventive Screening-Counseling & Management  Alcohol-Tobacco     Smoking Status: never  Allergies (verified): No Known Drug Allergies  Past History:  Past Medical History: Last updated: 07/22/2009 Nephrolithiasis, hx of ? Rheumatoid arthritis Colon polyps- done 2000 Atrial fibrillation Hypertension Gout  Past Surgical History: Last updated: 11/23/2009 Kidney Stones Spinal Surgery x2 Total hip replacement-L hip Cholecystectomy-1994 Right Knee Replacement--10/20/09  Review of Systems       see HPI  Physical Exam  General:  Well-developed,well-nourished,in no acute distress; alert,appropriate and cooperative throughout examination Lungs:  Normal respiratory effort, chest expands symmetrically. Lungs are clear to auscultation, no crackles or wheezes. Heart:  Normal rate and regular rhythm. S1 and S2 normal without gallop, murmur, click, rub or other extra  sounds. Extremities:  No clubbing, cyanosis, edema, or deformity noted with normal full range of motion of all joints.     Impression & Recommendations:  Problem # 1:  HYPERTENSION (ICD-401.9) BP stable.  Continue same. His updated medication list for this problem includes:    Enalapril Maleate 10 Mg Tabs (Enalapril maleate) .[name]... Take one tablet two times a day    Diltiazem Hcl 60 Mg Tabs (Diltiazem hcl) .[name]... Take two tablet bid    Triamterene-hctz 37.5-25 Mg Tabs (Triamterene-hctz) ..... One tablet by mouth daily    Diovan 160 Mg Tabs (Valsartan) ..... One tablet by mouth once daily  Orders: TLB-BMP (Basic Metabolic Panel-BMET) ([id number]) Prescription Created Electronically [phone])  BP today: 146/80 Prior BP: 110/70 (06/23/2010)  Prior 10 Yr Risk Heart Disease: Not enough information (03/31/2010)  Labs Reviewed: K+: 4.2 (04/27/2010) Creat: : 1.10 (04/27/2010)   Chol: 171 (04/16/2010)   HDL: 68 (04/16/2010)   LDL: 93 (04/16/2010)   TG: 48 (04/16/2010)  Problem # 2:  RHEUMATOID ARTHRITIS (ICD-714.0) Assessment: Comment Only Clinically stable, not on methotrexate- defer management to Dr.  Estanislado Pandy.  Pneumovax given today  Problem # 3:  GENITAL HERPES (ICD-054.10) Assessment: Comment Only asymptomatic.   Pt is off valtrex.  Can consider suppressive therapy again to decrease risk of transmission to partner later when he becomes sexually active again.  Complete Medication List: 1)  Enalapril Maleate 10 Mg Tabs (Enalapril maleate) .... Take one tablet two times a day 2)  Diltiazem Hcl 60 Mg Tabs (Diltiazem hcl) .... Take two tablet bid 3)  Warfarin Sodium 7.5 Mg Tabs (Warfarin sodium) .... Use as directed by anticoagulation clinic 4)  Colcrys 0.6 Mg Tabs (Colchicine) .... Take 2 tabs by mouth at start of gout flare, and 1 tablet 3 hours later.  (max 3 tabs total per gout attack) 5)  Allopurinol 100 Mg Tabs (Allopurinol) .... Two tablets by mouth daily 6)  Viagra 100 Mg Tabs  (Sildenafil citrate) .... Take 1 tablet 30 minutes before sexual activity 7)  Triamterene-hctz 37.5-25 Mg Tabs (Triamterene-hctz) .... One tablet by mouth daily 8)  Methocarbamol 500 Mg Tabs (Methocarbamol) .... Take 1 tablet by mouth every 6 hours if needed. 9)  Diovan 160 Mg Tabs (Valsartan) .... One tablet by mouth once daily  Other Orders: T-Hgb A1C [id number]) Pneumococcal Vaccine [id number]) Admin 1st Vaccine [id number])  Patient Instructions: 1)  Please schedule a follow-up appointment in 3 months. 2)  Please complete your blood work on the first floor today. Prescriptions: DIOVAN 160 MG TABS (VALSARTAN) one tablet by mouth once daily  #90 x 1   Entered and Authorized by:   Nance Pear FNP   Signed by:   Nance Pear FNP on 09/20/2010   Method used:   Electronically to        [facility] [phone]* (retail)       [place], [place]  [address]       Ph: [phone]       Fax: [phone]   RxID:   [id number] METHOCARBAMOL 500 MG TABS (METHOCARBAMOL) Take 1 tablet by mouth every 6 hours if needed.  #30 x 0   Entered and Authorized by:   Nance Pear FNP   Signed by:   Nance Pear FNP on 09/20/2010   Method used:   Electronically to        [facility] [phone]* (retail)       [place], [place]  [address]       Ph: [phone]       Fax: [phone]   RxID:   [id number] TRIAMTERENE-HCTZ 37.5-25 MG TABS (TRIAMTERENE-HCTZ) one tablet by mouth daily  #90 x 1   Entered and Authorized by:   Nance Pear FNP   Signed by:   Nance Pear FNP on 09/20/2010   Method used:   Electronically to        [facility] [phone]* (retail)       [place], [place]  [address]       Ph: [phone]       Fax: [phone]   [id number] VIAGRA 100 MG TABS (SILDENAFIL CITRATE) take 1 tablet 30 minutes before sexual activity  #6 x 5   Entered and Authorized  by:   Nance Pear FNP   Signed by:   Nance Pear FNP on 09/20/2010   Method used:   Electronically to        [facility] [phone]* (retail)       [address]       [place], [place]  [address]       Ph: [phone]       Fax:  [phone]   [id number] ALLOPURINOL 100 MG TABS (ALLOPURINOL) two tablets by mouth daily  #180 x 1   Entered and Authorized by:   Nance Pear FNP   Signed by:   Nance Pear FNP on 09/20/2010   Method used:   Electronically to        [facility] [phone]* (retail)       [place], [place]  [address]       Ph: [phone]       Fax: [phone]   RxID:   [id number] COLCRYS 0.6 MG TABS (COLCHICINE) take 2 tabs by mouth at start of gout flare, and 1 tablet 3 hours later.  (Max 3 tabs total per gout attack)  #20 Tablet x 1   Entered and Authorized by:   Nance Pear FNP   Signed by:   Nance Pear FNP on 09/20/2010   Method used:   Electronically to        [facility] [phone]* (retail)       [place], [place]  [address]       Ph: [phone]       Fax: [phone]   RxID:   [id number] DILTIAZEM HCL 60 MG TABS (DILTIAZEM HCL) take two tablet bid  #180 x 1   Entered and Authorized by:   Nance Pear FNP   Signed by:   Nance Pear FNP on 09/20/2010   Method used:   Electronically to        [facility] [phone]* (retail)       [place], [place]  [address]       Ph: [phone]       Fax: [phone]   [id number] ENALAPRIL MALEATE 10 MG TABS (ENALAPRIL MALEATE) take one tablet two times a day  #180 x 1   Entered and Authorized by:   Nance Pear FNP   Signed by:   Nance Pear FNP on 09/20/2010   Method used:   Electronically to        [facility] [phone]* (retail)       [place], [place]  [address]       Ph: [phone]       Fax:  [phone]   RxID:   [id number]    Orders Added: 1)  TLB-BMP (Basic Metabolic Panel-BMET) [id number] 2)  T-Hgb A1C [[address]-23375] 3)  Pneumococcal Vaccine [id number] 4)  Admin 1st Vaccine [[id number]] 5)  Est. Patient Level III [id number] 6)  Prescription Created Electronically [id number]   Immunizations Administered:  Pneumonia Vaccine:    Vaccine Type: Pneumovax (Medicare)    Site: right deltoid    Mfr: Merck    Dose: 0.5 ml    Route: IM    Given by: Kelle Darting CMA ([place])    Exp. Date: 12/10/2011    Lot #: [id number]    VIS given: 06/22/09 version given September 20, 2010.   Immunizations Administered:  Pneumonia Vaccine:    Vaccine Type: Pneumovax (Medicare)    Site: right deltoid    Mfr: Merck    Dose: 0.5 ml    Route: IM    Given by: Kelle Darting CMA ([place])    Exp. Date: 12/10/2011  Lot #: [id number]    VIS given: 06/22/09 version given September 20, 2010.  Current Allergies (reviewed today): No known allergies

## 2010-09-28 NOTE — Letter (Signed)
   [facility] [place], [place]  [address]  [place] Phone: [phone]      September 20, 2010   [facility] [address] [place], [place] [id number]  RE:  LAB RESULTS  Dear  Mr. Chenier,  The following is an interpretation of your most recent lab tests.  Please take note of any instructions provided or changes to medications that have resulted from your lab work.  ELECTROLYTES:  Good - no changes needed  KIDNEY FUNCTION TESTS:  Good - no changes needed   DIABETIC STUDIES:  Good - no changes needed Blood Glucose: 98   HgbA1C: 6.1      Sincerely Yours,    Nance Pear FNP  Appended Document:  Mailed.

## 2010-09-30 ENCOUNTER — Ambulatory Visit (INDEPENDENT_AMBULATORY_CARE_PROVIDER_SITE_OTHER): Payer: Medicare Other | Admitting: Internal Medicine

## 2010-09-30 ENCOUNTER — Encounter: Payer: Self-pay | Admitting: Internal Medicine

## 2010-09-30 ENCOUNTER — Encounter (INDEPENDENT_AMBULATORY_CARE_PROVIDER_SITE_OTHER): Payer: Medicare Other

## 2010-09-30 DIAGNOSIS — Z7901 Long term (current) use of anticoagulants: Secondary | ICD-10-CM

## 2010-09-30 DIAGNOSIS — I4891 Unspecified atrial fibrillation: Secondary | ICD-10-CM

## 2010-09-30 DIAGNOSIS — I10 Essential (primary) hypertension: Secondary | ICD-10-CM

## 2010-09-30 LAB — CONVERTED CEMR LAB: POC INR: 2.6

## 2010-10-05 NOTE — Assessment & Plan Note (Signed)
Summary: 1 year follow up/klper check out/sf   Visit Type:  1 year follow up Referring Provider:  Annye Asa, MD Primary Provider:  Nance Pear FNP   History of Present Illness: [name] returns  today for ongoing followup of atrial fibrillation and HTN. 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 He has had  knee and hip replacement surgery.  He does not have chest pain, sob or peripheral edema. His knee pain is resolved.  Current Medications (verified): 1)  Enalapril Maleate 10 Mg Tabs (Enalapril Maleate) .... Take One Tablet Two Times A Day 2)  Diltiazem Hcl 60 Mg Tabs (Diltiazem Hcl) .... Take Two Tablet Bid 3)  Warfarin Sodium 7.5 Mg Tabs (Warfarin Sodium) .... Use As Directed By Anticoagulation Clinic 4)  Colcrys 0.6 Mg Tabs (Colchicine) .... Take 2 Tabs By Mouth At Start of Gout Flare, and 1 Tablet 3 Hours Later.  (Max 3 Tabs Total Per Gout Attack) 5)  Allopurinol 100 Mg Tabs (Allopurinol) .... Two Tablets By Mouth Daily 6)  Viagra 100 Mg Tabs (Sildenafil Citrate) .... Take 1 Tablet 30 Minutes Before Sexual Activity 7)  Triamterene-Hctz 37.5-25 Mg Tabs (Triamterene-Hctz) .... One Tablet By Mouth Daily 8)  Methocarbamol 500 Mg Tabs (Methocarbamol) .... Take 1 Tablet By Mouth Every 6 Hours If Needed. 9)  Diovan 160 Mg Tabs (Valsartan) .... One Tablet By Mouth Once Daily  Allergies: No Known Drug Allergies  Past History:  Past Medical History: Last updated: 07/22/2009 Nephrolithiasis, hx of ? Rheumatoid arthritis Colon polyps- done 2000 Atrial fibrillation Hypertension Gout  Past Surgical History: Last updated: 11/23/2009 Kidney Stones Spinal Surgery x2 Total hip replacement-L hip Cholecystectomy-1994 Right Knee Replacement--10/20/09  Review of Systems  The patient denies chest pain, syncope, dyspnea on exertion, and peripheral edema.    Vital  Signs:  Patient profile:   70 year old male Height:      75 inches Weight:      266 pounds BMI:     33.37 Pulse rate:   55 / minute Resp:     18 per minute BP sitting:   179 / 86  (left arm)  Vitals Entered By: Burnett Kanaris (September 30, 2010 8:52 AM)  Physical Exam  General:  Well-developed,well-nourished,in no acute distress; alert,appropriate and cooperative throughout examination Head:  Normocephalic and atraumatic without obvious abnormalities. No apparent alopecia or balding. Eyes:  PERRLA, no icterus.exam deferred to patient's ophthalmologist.   Mouth:  no lesions Neck:  No deformities, masses, or tenderness noted. Lungs:  Normal respiratory effort, chest expands symmetrically. Lungs are clear to auscultation, no crackles or wheezes. Heart:  Normal rate and irregular rhythm. S1 and S2 normal without gallop, murmur, click, rub or other extra sounds. Abdomen:  Soft, nontender and nondistended. No masses, hepatosplenomegaly or hernias noted. Normal bowel sounds.obese.   Msk:  + swelling right knee (s/p R TKA) No erythema noted Pulses:  pulses normal in all 4 extremities Extremities:  No clubbing, cyanosis, edema, or deformity noted with normal full range of motion of all joints.   Neurologic:  Alert and  oriented x4;  grossly normal neurologically.   EKG  Procedure date:  09/30/2010  Findings:      Atrial fibrillation with a controlled ventricular response rate of: 55.  Impression & Recommendations:  Problem # 1:  ATRIAL FIBRILLATION (ICD-427.31) His rates are well controlled and he is tolerating warfarin. Continue. His updated medication list for this  problem includes:    Warfarin Sodium 7.5 Mg Tabs (Warfarin sodium) ..... Use as directed by anticoagulation clinic  Orders: EKG w/ Interpretation (93000)  Problem # 2:  HYPERTENSION (ICD-401.9) When I check his pressure today it is 144/78. I have asked him to maintain a low sodium diet. His updated medication list  for this problem includes:    Enalapril Maleate 10 Mg Tabs (Enalapril maleate) .[name]... Take one tablet two times a day    Diltiazem Hcl 60 Mg Tabs (Diltiazem hcl) .[name]... Take two tablet bid    Triamterene-hctz 37.5-25 Mg Tabs (Triamterene-hctz) ..... One tablet by mouth daily    Diovan 160 Mg Tabs (Valsartan) ..... One tablet by mouth once daily  Patient Instructions: 1)  Your physician recommends that you continue on your current medications as directed. Please refer to the Current Medication list given to you today. 2)  Your physician wants you to follow-up in:   [place] will receive a reminder letter in the mail two months in advance. If you don't receive a letter, please call our office to schedule the follow-up appointment.

## 2010-10-05 NOTE — Medication Information (Signed)
Summary: rov/pc  Anticoagulant Therapy  Managed by: Tula Nakayama, RN, BSN Referring MD: Cristopher Peru, MD PCP: Nance Pear FNP Supervising MD: Harrington Challenger MD, Nevin Bloodgood Indication 1: Atrial fibrillation Lab Used: [facility] [place] Site: [place] INR POC 2.6 INR RANGE 2-3  Dietary changes: no    Health status changes: no    Bleeding/hemorrhagic complications: no    Recent/future hospitalizations: no    Any changes in medication regimen? no    Recent/future dental: no  Any missed doses?: no       Is patient compliant with meds? yes      Comments: Seeing Dr Lovena Le today.   Allergies: No Known Drug Allergies  Anticoagulation Management History:      The patient is taking warfarin and comes in today for a routine follow up visit.  Positive risk factors for bleeding include an age of 67 years or older.  The bleeding index is 'intermediate risk'.  Positive CHADS2 values include History of HTN.  Negative CHADS2 values include Age > 28 years old.  His last INR was 2.8.  Anticoagulation responsible provider: Harrington Challenger MD, Nevin Bloodgood.  INR POC: 2.6.  [place] Lot#: [id number].  Exp: 09/2011.    Anticoagulation Management Assessment/Plan:      The patient's current anticoagulation dose is Warfarin sodium 7.5 mg tabs: Use as directed by Anticoagulation Clinic.  The target INR is 2.0-3.0.  The next INR is due 10/28/2010.  Anticoagulation instructions were given to patient.  Results were reviewed/authorized by Tula Nakayama, RN, BSN.  He was notified by Tula Nakayama, RN, BSN.         Prior Anticoagulation Instructions: INR 2.0 Continue taking 7.5mg  every day except take 10 mg on Thursdays. Recheck in 4 weeks.  Current Anticoagulation Instructions: INR 2.6 Continue 7.5mg s everyday except 10mg s on Thursdays. Recheck in 4 weeks.

## 2010-10-06 LAB — URINALYSIS, ROUTINE W REFLEX MICROSCOPIC
Nitrite: NEGATIVE
Specific Gravity, Urine: 1.02 (ref 1.005–1.030)
Urobilinogen, UA: 1 mg/dL (ref 0.0–1.0)
pH: 6 (ref 5.0–8.0)

## 2010-10-06 LAB — BASIC METABOLIC PANEL
BUN: 16 mg/dL (ref 6–23)
BUN: 16 mg/dL (ref 6–23)
CO2: 31 mEq/L (ref 19–32)
CO2: 32 mEq/L (ref 19–32)
Calcium: 8.5 mg/dL (ref 8.4–10.5)
Calcium: 8.7 mg/dL (ref 8.4–10.5)
Calcium: 8.8 mg/dL (ref 8.4–10.5)
Chloride: 99 mEq/L (ref 96–112)
Creatinine, Ser: 1.01 mg/dL (ref 0.4–1.5)
Creatinine, Ser: 1.04 mg/dL (ref 0.4–1.5)
Creatinine, Ser: 1.04 mg/dL (ref 0.4–1.5)
GFR calc Af Amer: >60 mL/min (ref >60)
GFR calc Af Amer: >60 mL/min (ref >60)
GFR calc non Af Amer: >60 mL/min (ref >60)
GFR calc non Af Amer: >60 mL/min (ref >60)
GFR calc non Af Amer: >60 mL/min (ref >60)
Glucose, Bld: 122 mg/dL — ABNORMAL HIGH (ref 70–99)
Glucose, Bld: 123 mg/dL — ABNORMAL HIGH (ref 70–99)
Glucose, Bld: 97 mg/dL (ref 70–99)
Potassium: 3.2 mEq/L — ABNORMAL LOW (ref 3.5–5.1)
Potassium: 3.6 mEq/L (ref 3.5–5.1)
Potassium: 3.9 mEq/L (ref 3.5–5.1)
Sodium: 137 mEq/L (ref 135–145)
Sodium: 138 mEq/L (ref 135–145)

## 2010-10-06 LAB — CBC
HCT: 32.1 % — ABNORMAL LOW (ref 39.0–52.0)
HCT: 34.5 % — ABNORMAL LOW (ref 39.0–52.0)
HCT: 34.7 % — ABNORMAL LOW (ref 39.0–52.0)
Hemoglobin: 11.4 g/dL — ABNORMAL LOW (ref 13.0–17.0)
Hemoglobin: 11.9 g/dL — ABNORMAL LOW (ref 13.0–17.0)
Hemoglobin: 11.9 g/dL — ABNORMAL LOW (ref 13.0–17.0)
MCHC: 33.4 g/dL (ref 30.0–36.0)
MCHC: 34.1 g/dL (ref 30.0–36.0)
MCHC: 34.3 g/dL (ref 30.0–36.0)
MCV: 92.8 fL (ref 78.0–100.0)
MCV: 92.9 fL (ref 78.0–100.0)
MCV: 93 fL (ref 78.0–100.0)
Platelets: 212 10*3/uL (ref 150–400)
Platelets: 237 10*3/uL (ref 150–400)
Platelets: 248 10*3/uL (ref 150–400)
RBC: 3.59 MIL/uL — ABNORMAL LOW (ref 4.22–5.81)
RBC: 3.7 MIL/uL — ABNORMAL LOW (ref 4.22–5.81)
RBC: 3.73 MIL/uL — ABNORMAL LOW (ref 4.22–5.81)
RDW: 14.2 % (ref 11.5–15.5)
RDW: 14.4 % (ref 11.5–15.5)
RDW: 14.7 % (ref 11.5–15.5)
WBC: 15.6 10*3/uL — ABNORMAL HIGH (ref 4.0–10.5)
WBC: 16.5 10*3/uL — ABNORMAL HIGH (ref 4.0–10.5)
WBC: 9.8 10*3/uL (ref 4.0–10.5)

## 2010-10-06 LAB — URINE CULTURE
Colony Count: NO GROWTH
Colony Count: NO GROWTH

## 2010-10-06 LAB — PROTIME-INR
INR: 1.57 — ABNORMAL HIGH (ref 0.00–1.49)
INR: 1.74 — ABNORMAL HIGH (ref 0.00–1.49)
INR: 1.95 — ABNORMAL HIGH (ref 0.00–1.49)
Prothrombin Time: 20.7 seconds — ABNORMAL HIGH (ref 11.6–15.2)
Prothrombin Time: 21.1 seconds — ABNORMAL HIGH (ref 11.6–15.2)
Prothrombin Time: 22.1 seconds — ABNORMAL HIGH (ref 11.6–15.2)
Prothrombin Time: 26.1 seconds — ABNORMAL HIGH (ref 11.6–15.2)
Prothrombin Time: 31.2 seconds — ABNORMAL HIGH (ref 11.6–15.2)

## 2010-10-06 LAB — URINALYSIS, MICROSCOPIC ONLY
Bilirubin Urine: NEGATIVE
Glucose, UA: NEGATIVE mg/dL
Specific Gravity, Urine: 1.011 (ref 1.005–1.030)
Urobilinogen, UA: 1 mg/dL (ref 0.0–1.0)
pH: 7 (ref 5.0–8.0)

## 2010-10-06 LAB — COMPREHENSIVE METABOLIC PANEL
ALT: 30 U/L (ref 0–53)
AST: 32 U/L (ref 0–37)
CO2: 31 mEq/L (ref 19–32)
Chloride: 95 mEq/L — ABNORMAL LOW (ref 96–112)
Creatinine, Ser: 1.08 mg/dL (ref 0.4–1.5)
GFR calc Af Amer: >60 mL/min (ref >60)
GFR calc non Af Amer: >60 mL/min (ref >60)
Glucose, Bld: 117 mg/dL — ABNORMAL HIGH (ref 70–99)
Sodium: 133 mEq/L — ABNORMAL LOW (ref 135–145)
Total Bilirubin: 1.1 mg/dL (ref 0.3–1.2)

## 2010-10-11 LAB — DIFFERENTIAL
Eosinophils Absolute: 0.2 10*3/uL (ref 0.0–0.7)
Eosinophils Relative: 2 % (ref 0–5)
Lymphocytes Relative: 21 % (ref 12–46)
Lymphs Abs: 2 10*3/uL (ref 0.7–4.0)
Monocytes Absolute: 0.5 10*3/uL (ref 0.1–1.0)

## 2010-10-11 LAB — COMPREHENSIVE METABOLIC PANEL
ALT: 19 U/L (ref 0–53)
AST: 35 U/L (ref 0–37)
Albumin: 4 g/dL (ref 3.5–5.2)
CO2: 30 mEq/L (ref 19–32)
Calcium: 9.8 mg/dL (ref 8.4–10.5)
Chloride: 104 mEq/L (ref 96–112)
Creatinine, Ser: 1.15 mg/dL (ref 0.4–1.5)
GFR calc Af Amer: >60 mL/min (ref >60)
GFR calc non Af Amer: >60 mL/min (ref >60)
Sodium: 140 mEq/L (ref 135–145)
Total Bilirubin: 0.7 mg/dL (ref 0.3–1.2)

## 2010-10-11 LAB — TYPE AND SCREEN
ABO/RH(D): O POS
Antibody Screen: NEGATIVE

## 2010-10-11 LAB — CBC
MCHC: 33.8 g/dL (ref 30.0–36.0)
RBC: 4.57 MIL/uL (ref 4.22–5.81)
RDW: 14.2 % (ref 11.5–15.5)

## 2010-10-11 LAB — ABO/RH: ABO/RH(D): O POS

## 2010-10-28 ENCOUNTER — Ambulatory Visit (INDEPENDENT_AMBULATORY_CARE_PROVIDER_SITE_OTHER): Payer: Medicare Other | Admitting: *Deleted

## 2010-10-28 DIAGNOSIS — Z7901 Long term (current) use of anticoagulants: Secondary | ICD-10-CM

## 2010-10-28 DIAGNOSIS — I4891 Unspecified atrial fibrillation: Secondary | ICD-10-CM

## 2010-10-28 LAB — POCT INR: INR: 2.2

## 2010-10-28 NOTE — Patient Instructions (Signed)
Continue taking 1.5 tablets on Thursday. And 1 tablet all other days. Recheck in 4 weeks.

## 2010-11-22 ENCOUNTER — Encounter: Payer: Self-pay | Admitting: Family

## 2010-11-25 ENCOUNTER — Ambulatory Visit (INDEPENDENT_AMBULATORY_CARE_PROVIDER_SITE_OTHER): Payer: Medicare Other | Admitting: *Deleted

## 2010-11-25 DIAGNOSIS — I4891 Unspecified atrial fibrillation: Secondary | ICD-10-CM

## 2010-11-25 DIAGNOSIS — Z7901 Long term (current) use of anticoagulants: Secondary | ICD-10-CM | POA: Insufficient documentation

## 2010-12-15 ENCOUNTER — Encounter: Payer: Self-pay | Admitting: Family

## 2010-12-22 ENCOUNTER — Ambulatory Visit (INDEPENDENT_AMBULATORY_CARE_PROVIDER_SITE_OTHER): Payer: Medicare Other | Admitting: Family

## 2010-12-22 ENCOUNTER — Encounter: Payer: Self-pay | Admitting: Family

## 2010-12-22 DIAGNOSIS — M069 Rheumatoid arthritis, unspecified: Secondary | ICD-10-CM

## 2010-12-22 DIAGNOSIS — I10 Essential (primary) hypertension: Secondary | ICD-10-CM

## 2010-12-22 NOTE — Assessment & Plan Note (Addendum)
BP Readings from Last 3 Encounters:  12/22/10 140/70  09/30/10 179/86  09/20/10 146/80   BP stable and improved. Continue current meds.

## 2010-12-22 NOTE — Progress Notes (Signed)
Subjective:    Patient ID: Bruce Cole, male    DOB: 24-Nov-1940, 70 y.o.   MRN: [name]  HPI  RA- started methotrexate,  Joint pain is much improved.    Patient presents today for followup of hypertension.  Patient has been treated for Chronic HTN for quiet sometime. She is currently on diltiazem, diovan, maxide, vasotec, and well controlled. No associated S/S related to HTN.   Quality: chronic Modifying factor: meds Duration: Quite sometime Associated S/S: None.  The patient denies the following associated symptoms: Chest pain, dyspnea, blurred vision, headache, or lower extremity edema.    Review of Systems  See HPI  Past Medical History  Diagnosis Date  . Nephrolithiasis   . Arthritis     rheumatoid  . Colon polyp 2000  . Atrial fibrillation   . Unspecified essential hypertension   . Gout     History   Social History  . Marital Status: Single    Spouse Name: N/A    Number of Children: 3  . Years of Education: N/A   Occupational History  .     Social History Main Topics  . Smoking status: Never Smoker   . Smokeless tobacco: Not on file  . Alcohol Use: Yes     occasional  . Drug Use: No  . Sexually Active:    Other Topics Concern  . 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 Spine surgery     x 2  . Joint replacement     Total L-Hip replacement, Right Knee 10/20/09  . Cholecystectomy 1994    Family History  Problem Relation Age of Onset  . Hypertension Mother   . Arthritis Mother     ?RA  . Hypertension Father     No Known Allergies  Current Outpatient Prescriptions on File Prior to Visit  Medication Sig Dispense Refill  . allopurinol (ZYLOPRIM) 100 MG tablet Take 2 tablets by mouth daily.      [name] COLCRYS 0.6 MG tablet Take 2 tabs by mouth at start of gout flare, and 1 tablet 3 hours later.  (Max 3 tabs total per gout attack)      . diltiazem  (CARDIZEM) 60 MG tablet Take 2 tablets by mouth Twice daily.      [name] DIOVAN 160 MG tablet Take 1 tablet by mouth daily.      . enalapril (VASOTEC) 10 MG tablet Take 1 tablet by mouth Twice daily.      . methocarbamol (ROBAXIN) 500 MG tablet Take 500 mg by mouth 4 (four) times daily as needed.        . sildenafil (VIAGRA) 100 MG tablet Take 100 mg by mouth daily as needed.        . triamterene-hydrochlorothiazide (MAXZIDE-25) 37.5-25 MG per tablet Take 1 tablet by mouth daily.      [name] warfarin (COUMADIN) 7.5 MG tablet Take by mouth as directed.          BP 140/70  Pulse 54  Temp(Src) 98.5 F (36.9 C) (Oral)  Resp 16  Ht 6\' 3"  (1.905 m)  Wt 265 lb 0.6 oz (120.221 kg)  BMI 33.13 kg/m2       Objective:   Physical Exam  Constitutional: He appears well-developed and well-nourished.  Cardiovascular: Normal rate and regular rhythm.   Pulmonary/Chest: Effort normal and breath sounds normal.  Musculoskeletal: He exhibits  no edema.            Assessment & Plan:

## 2010-12-22 NOTE — Assessment & Plan Note (Signed)
Clinically improved on methotrexate.  This is being managed by Dr. Estanislado Pandy.

## 2010-12-23 ENCOUNTER — Ambulatory Visit (INDEPENDENT_AMBULATORY_CARE_PROVIDER_SITE_OTHER): Payer: Medicare Other | Admitting: *Deleted

## 2010-12-23 DIAGNOSIS — I4891 Unspecified atrial fibrillation: Secondary | ICD-10-CM

## 2010-12-28 ENCOUNTER — Other Ambulatory Visit: Payer: Self-pay | Admitting: Cardiology

## 2010-12-28 NOTE — Telephone Encounter (Signed)
Refill request

## 2011-01-27 ENCOUNTER — Ambulatory Visit (INDEPENDENT_AMBULATORY_CARE_PROVIDER_SITE_OTHER): Payer: Medicare Other | Admitting: *Deleted

## 2011-01-27 DIAGNOSIS — I4891 Unspecified atrial fibrillation: Secondary | ICD-10-CM

## 2011-02-24 ENCOUNTER — Encounter: Payer: Medicare Other | Admitting: *Deleted

## 2011-02-24 ENCOUNTER — Ambulatory Visit (INDEPENDENT_AMBULATORY_CARE_PROVIDER_SITE_OTHER): Payer: Medicare Other | Admitting: *Deleted

## 2011-02-24 DIAGNOSIS — I4891 Unspecified atrial fibrillation: Secondary | ICD-10-CM

## 2011-03-01 ENCOUNTER — Other Ambulatory Visit: Payer: Self-pay | Admitting: Family

## 2011-03-23 ENCOUNTER — Other Ambulatory Visit: Payer: Self-pay | Admitting: Family

## 2011-03-25 ENCOUNTER — Ambulatory Visit (INDEPENDENT_AMBULATORY_CARE_PROVIDER_SITE_OTHER): Payer: Medicare Other | Admitting: *Deleted

## 2011-03-25 DIAGNOSIS — I4891 Unspecified atrial fibrillation: Secondary | ICD-10-CM

## 2011-03-27 ENCOUNTER — Other Ambulatory Visit: Payer: Self-pay | Admitting: Family

## 2011-03-28 ENCOUNTER — Other Ambulatory Visit: Payer: Self-pay | Admitting: Family

## 2011-03-29 ENCOUNTER — Ambulatory Visit (INDEPENDENT_AMBULATORY_CARE_PROVIDER_SITE_OTHER): Payer: Medicare Other | Admitting: Family

## 2011-03-29 ENCOUNTER — Encounter: Payer: Self-pay | Admitting: Family

## 2011-03-29 VITALS — BP 140/90 | HR 56 | Temp 97.9°F | Resp 18 | Ht 75.0 in | Wt 270.0 lb

## 2011-03-29 DIAGNOSIS — I10 Essential (primary) hypertension: Secondary | ICD-10-CM

## 2011-03-29 DIAGNOSIS — M069 Rheumatoid arthritis, unspecified: Secondary | ICD-10-CM

## 2011-03-29 DIAGNOSIS — R739 Hyperglycemia, unspecified: Secondary | ICD-10-CM

## 2011-03-29 DIAGNOSIS — R7309 Other abnormal glucose: Secondary | ICD-10-CM

## 2011-03-29 LAB — BASIC METABOLIC PANEL
BUN: 20 mg/dL (ref 6–23)
CO2: 25 mEq/L (ref 19–32)
Chloride: 101 mEq/L (ref 96–112)
Glucose, Bld: 115 mg/dL — ABNORMAL HIGH (ref 70–99)
Potassium: 4.2 mEq/L (ref 3.5–5.3)
Sodium: 139 mEq/L (ref 135–145)

## 2011-03-29 NOTE — Progress Notes (Signed)
Subjective:    Patient ID: Bruce Cole, male    DOB: 1940-10-18, 70 y.o.   MRN: [name]  HPI  Bruce Cole is a 70 yr old male who presents today for follow up.    1) HTN-  He continues on maxide, diovan and vasotec. He reports that he runs 120-130/70-80.  2) RA- this is being followed by Rheumatology. Started a new medication doesn't remember the name.  Walking without a cane.    3) Hyperglycemia-he has had some weight gain since last visit.    Review of Systems See HPI  Past Medical History  Diagnosis Date  . Nephrolithiasis   . Arthritis     rheumatoid  . Colon polyp 2000  . Atrial fibrillation   . Unspecified essential hypertension   . Gout     History   Social History  . Marital Status: Single    Spouse Name: N/A    Number of Children: 3  . Years of Education: N/A   Occupational History  .     Social History Main Topics  . Smoking status: Never Smoker   . Smokeless tobacco: Never Used  . Alcohol Use: Yes     occasional  . Drug Use: No  . Sexually Active: Not on file   Other Topics Concern  . 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 Spine surgery     x 2  . Joint replacement     Total L-Hip replacement, Right Knee 10/20/09  . Cholecystectomy 1994    Family History  Problem Relation Age of Onset  . Hypertension Mother   . Arthritis Mother     ?RA  . Hypertension Father     No Known Allergies  Current Outpatient Prescriptions on File Prior to Visit  Medication Sig Dispense Refill  . allopurinol (ZYLOPRIM) 100 MG tablet take 2 tablets by mouth once daily  180 tablet  1  . COLCRYS 0.6 MG tablet Take 2 tabs by mouth at start of gout flare, and 1 tablet 3 hours later.  (Max 3 tabs total per gout attack)      . diltiazem (CARDIZEM) 60 MG tablet take 2 tablets by mouth twice a day  180 tablet  1  . DIOVAN 160 MG tablet take 1 tablet by mouth once  daily  90 tablet  0  . enalapril (VASOTEC) 10 MG tablet take 1 tablet by mouth twice a day  180 tablet  1  . methocarbamol (ROBAXIN) 500 MG tablet Take 500 mg by mouth 4 (four) times daily as needed.        . sildenafil (VIAGRA) 100 MG tablet Take 100 mg by mouth daily as needed.        . triamterene-hydrochlorothiazide (MAXZIDE-25) 37.5-25 MG per tablet take 1 tablet by mouth once daily  90 tablet  1  . warfarin (COUMADIN) 7.5 MG tablet Take as directed by Anticoagulation clinic  40 tablet  3    BP 140/90  Pulse 56  Temp(Src) 97.9 F (36.6 C) (Oral)  Resp 18  Ht 6\' 3"  (1.905 m)  Wt 270 lb (122.471 kg)  BMI 33.75 kg/m2       Objective:   Physical Exam  Constitutional: He appears well-developed and well-nourished.  Cardiovascular: Normal rate and regular rhythm.   Pulmonary/Chest: Effort normal and breath sounds normal. No respiratory distress. He has no  wheezes. He has no rales. He exhibits no tenderness.  Musculoskeletal: He exhibits no edema.          Assessment & Plan:   BP Readings from Last 3 Encounters:  03/29/11 140/90  12/22/10 140/70  09/30/10 179/86

## 2011-03-29 NOTE — Assessment & Plan Note (Signed)
Clinically improved. He is following with Rheumatology and will contact us with the name of his new medication.

## 2011-03-29 NOTE — Assessment & Plan Note (Signed)
Check a1c. We discussed exercise, diet and weight loss.

## 2011-03-29 NOTE — Assessment & Plan Note (Signed)
BP is borderline elevated today- home readings are better. Continue current meds for now- monitor.

## 2011-03-29 NOTE — Patient Instructions (Signed)
Please complete your lab work on the first floor.  Follow up in 3 months, come fasting to this appointment.

## 2011-03-29 NOTE — Telephone Encounter (Signed)
Refill of triamterene-hctz was denied as refill was just sent to pharmacy on 03/23/11 #90 x 1 refill. Should still have refills on file.

## 2011-03-30 ENCOUNTER — Encounter: Payer: Self-pay | Admitting: Family

## 2011-04-01 ENCOUNTER — Other Ambulatory Visit: Payer: Self-pay | Admitting: Family

## 2011-04-02 ENCOUNTER — Other Ambulatory Visit: Payer: Self-pay | Admitting: Family

## 2011-04-04 NOTE — Telephone Encounter (Signed)
Refill for Diovan 160mg  sent to pharmacy on 03/28/11 #90 x no refills. Left message on pharm. Voicemail to check refills on file.

## 2011-04-04 NOTE — Telephone Encounter (Signed)
Addended by: Debbrah Alar on: 04/04/2011 08:24 AM   Modules accepted: Orders

## 2011-04-04 NOTE — Telephone Encounter (Signed)
Could you pls verify that rx was received by pharmacy? Thanks.

## 2011-04-11 ENCOUNTER — Emergency Department (HOSPITAL_BASED_OUTPATIENT_CLINIC_OR_DEPARTMENT_OTHER)
Admission: EM | Admit: 2011-04-11 | Discharge: 2011-04-11 | Disposition: A | Discharge status: Home / Self Care | Payer: Medicare Other | Attending: Emergency Medicine | Admitting: Emergency Medicine

## 2011-04-11 ENCOUNTER — Other Ambulatory Visit: Payer: Self-pay

## 2011-04-11 ENCOUNTER — Encounter (HOSPITAL_BASED_OUTPATIENT_CLINIC_OR_DEPARTMENT_OTHER): Payer: Self-pay | Admitting: Family Medicine

## 2011-04-11 DIAGNOSIS — R110 Nausea: Secondary | ICD-10-CM | POA: Insufficient documentation

## 2011-04-11 DIAGNOSIS — Z8739 Personal history of other diseases of the musculoskeletal system and connective tissue: Secondary | ICD-10-CM | POA: Insufficient documentation

## 2011-04-11 DIAGNOSIS — Z79899 Other long term (current) drug therapy: Secondary | ICD-10-CM | POA: Insufficient documentation

## 2011-04-11 DIAGNOSIS — H539 Unspecified visual disturbance: Secondary | ICD-10-CM | POA: Insufficient documentation

## 2011-04-11 DIAGNOSIS — I4891 Unspecified atrial fibrillation: Secondary | ICD-10-CM | POA: Insufficient documentation

## 2011-04-11 DIAGNOSIS — I10 Essential (primary) hypertension: Secondary | ICD-10-CM | POA: Insufficient documentation

## 2011-04-11 LAB — PROTIME-INR: INR: 2.13 — ABNORMAL HIGH (ref 0.00–1.49)

## 2011-04-11 LAB — BASIC METABOLIC PANEL
CO2: 28 mEq/L (ref 19–32)
Calcium: 9.7 mg/dL (ref 8.4–10.5)
Chloride: 99 mEq/L (ref 96–112)
Glucose, Bld: 150 mg/dL — ABNORMAL HIGH (ref 70–99)
Potassium: 3.8 mEq/L (ref 3.5–5.1)
Sodium: 138 mEq/L (ref 135–145)

## 2011-04-11 NOTE — ED Notes (Signed)
Pt c/o "seeing flashes" that started about 15 mins prior to arrival. Pt denies dizziness, chest pain, nausea and shob. Pt reports vision has "cleared up" at this time.

## 2011-04-11 NOTE — ED Provider Notes (Signed)
History     CSN: [id number] Arrival date & time: 04/11/2011  9:36 AM  Chief Complaint  Patient presents with  . Eye Problem    HPI  (Consider location/radiation/quality/duration/timing/severity/associated sxs/prior treatment)  The history is provided by the patient.  patient states that he had vision changes in both of his eyes after he went outside this morning. He states that he has this type of symptom most times when he goes into the bright. It was a blurring of areas of his vision. It lasted 10 minutes, which is longer that it typically lasts. He states that he had a dull headache when he woke up. No chest pain. No lighthededness. Mild nausea. No numbness or weakness. Back at baseline now. He states that he felt a little lightheadedness yesterday, but not now.   Past Medical History  Diagnosis Date  . Nephrolithiasis   . Arthritis     rheumatoid  . Colon polyp 2000  . Atrial fibrillation   . Unspecified essential hypertension   . Gout     Past Surgical History  Procedure Date  . Spine surgery     x 2  . Joint replacement     Total L-Hip replacement, Right Knee 10/20/09  . Cholecystectomy 1994    Family History  Problem Relation Age of Onset  . Hypertension Mother   . Arthritis Mother     ?RA  . Hypertension Father     History  Substance Use Topics  . Smoking status: Never Smoker   . Smokeless tobacco: Never Used  . Alcohol Use: Yes     occasional      Review of Systems  Review of Systems  Constitutional: Negative for activity change and appetite change.  HENT: Negative for ear pain, neck stiffness and tinnitus.   Eyes: Positive for visual disturbance. Negative for pain.  Respiratory: Negative for chest tightness and shortness of breath.   Cardiovascular: Negative for chest pain and leg swelling.  Gastrointestinal: Negative for nausea, vomiting, abdominal pain and diarrhea.  Genitourinary: Negative for hematuria and flank pain.  Musculoskeletal:  Positive for back pain.  Skin: Negative for rash.  Neurological: Negative for weakness, numbness and headaches.  Psychiatric/Behavioral: Negative for behavioral problems and confusion.    Allergies  Review of patient's allergies indicates no known allergies.  Home Medications   Current Outpatient Rx  Name Route Sig Dispense Refill  . ALLOPURINOL 100 MG PO TABS  take 2 tablets by mouth once daily 180 tablet 1  . COLCRYS 0.6 MG PO TABS  Take 2 tabs by mouth at start of gout flare, and 1 tablet 3 hours later.  (Max 3 tabs total per gout attack)    . DILTIAZEM HCL 60 MG PO TABS  take 2 tablets by mouth twice a day 180 tablet 1  . DIOVAN 160 MG PO TABS  take 1 tablet by mouth once daily 90 tablet 0  . ENALAPRIL MALEATE 10 MG PO TABS  take 1 tablet by mouth twice a day 180 tablet 1  . METHOCARBAMOL 500 MG PO TABS Oral Take 500 mg by mouth 4 (four) times daily as needed.      [name] SILDENAFIL CITRATE 100 MG PO TABS Oral Take 100 mg by mouth daily as needed.      . TRIAMTERENE-HCTZ 37.5-25 MG PO TABS  take 1 tablet by mouth once daily 90 tablet 1  . WARFARIN SODIUM 7.5 MG PO TABS  Take as directed by Anticoagulation clinic 40 tablet 3  Physical Exam    BP 190/74  Pulse 55  Temp(Src) 98 F (36.7 C) (Oral)  Resp 16  SpO2 100%  Physical Exam  Nursing note and vitals reviewed. Constitutional: He is oriented to person, place, and time. He appears well-developed and well-nourished.  HENT:  Head: Normocephalic and atraumatic.  Eyes: EOM are normal. Pupils are equal, round, and reactive to light.  Neck: Normal range of motion. Neck supple.  Cardiovascular: Normal rate, regular rhythm and normal heart sounds.   No murmur heard. Pulmonary/Chest: Effort normal and breath sounds normal.  Abdominal: Soft. Bowel sounds are normal. He exhibits no distension and no mass. There is no tenderness. There is no rebound and no guarding.  Musculoskeletal: Normal range of motion. He exhibits no edema.    Neurological: He is alert and oriented to person, place, and time. No cranial nerve deficit.  Skin: Skin is warm and dry.  Psychiatric: He has a normal mood and affect.    ED Course  Procedures (including critical care time)   Labs Reviewed  PROTIME-INR   No results found. Results for orders placed during the hospital encounter of 04/11/11  PROTIME-INR      Component Value Range   Prothrombin Time 24.2 (*) 11.6 - 15.2 (seconds)   INR 2.13 (*) 0.00 - 99991111   BASIC METABOLIC PANEL      Component Value Range   Sodium 138  135 - 145 (mEq/L)   Potassium 3.8  3.5 - 5.1 (mEq/L)   Chloride 99  96 - 112 (mEq/L)   CO2 28  19 - 32 (mEq/L)   Glucose, Bld 150 (*) 70 - 99 (mg/dL)   BUN 20  6 - 23 (mg/dL)   Creatinine, Ser 1.10  0.50 - 1.35 (mg/dL)   Calcium 9.7  8.4 - 10.5 (mg/dL)   GFR calc non Af Amer >60  >60 (mL/min)   GFR calc Af Amer >60  >60 (mL/min)   No results found.   No diagnosis found.   MDM Vision changes. Resolved. Doubt TIA. Migraine aura possible. pcp follow up.         Jasper Riling. Alvino Chapel, MD 04/11/11 1116

## 2011-04-11 NOTE — ED Notes (Signed)
MD at bedside. 

## 2011-04-18 LAB — CBC
Hemoglobin: 14.6
MCV: 93.1
RBC: 4.73
WBC: 10.8 — ABNORMAL HIGH

## 2011-04-18 LAB — URINALYSIS, ROUTINE W REFLEX MICROSCOPIC
Glucose, UA: NEGATIVE
Protein, ur: NEGATIVE
Specific Gravity, Urine: 1.009
pH: 6.5

## 2011-04-18 LAB — BASIC METABOLIC PANEL
Calcium: 9.9
Chloride: 100
Creatinine, Ser: 1.2
GFR calc Af Amer: >60
Sodium: 141

## 2011-04-18 LAB — DIFFERENTIAL
Eosinophils Absolute: 0.3
Monocytes Absolute: 0.7

## 2011-04-18 LAB — PROTIME-INR
INR: 1.8 — ABNORMAL HIGH
Prothrombin Time: 21.5 — ABNORMAL HIGH

## 2011-04-25 ENCOUNTER — Ambulatory Visit (INDEPENDENT_AMBULATORY_CARE_PROVIDER_SITE_OTHER): Payer: Medicare Other | Admitting: *Deleted

## 2011-04-25 DIAGNOSIS — I4891 Unspecified atrial fibrillation: Secondary | ICD-10-CM

## 2011-04-25 LAB — POCT INR: INR: 2.4

## 2011-05-21 ENCOUNTER — Other Ambulatory Visit: Payer: Self-pay | Admitting: Family

## 2011-05-21 ENCOUNTER — Other Ambulatory Visit: Payer: Self-pay | Admitting: Cardiology

## 2011-05-30 ENCOUNTER — Ambulatory Visit (INDEPENDENT_AMBULATORY_CARE_PROVIDER_SITE_OTHER): Payer: Medicare Other | Admitting: *Deleted

## 2011-05-30 DIAGNOSIS — Z7901 Long term (current) use of anticoagulants: Secondary | ICD-10-CM

## 2011-05-30 DIAGNOSIS — I4891 Unspecified atrial fibrillation: Secondary | ICD-10-CM

## 2011-05-31 ENCOUNTER — Other Ambulatory Visit: Payer: Self-pay | Admitting: Family

## 2011-05-31 NOTE — Telephone Encounter (Signed)
Spoke to [place] at [facility] and verified that Rx is still on file for Diovan 160 mg 1 tablet daily #90 x no refills and they will fill rx from that date.

## 2011-06-13 ENCOUNTER — Other Ambulatory Visit: Payer: Self-pay | Admitting: Family

## 2011-06-27 ENCOUNTER — Ambulatory Visit (INDEPENDENT_AMBULATORY_CARE_PROVIDER_SITE_OTHER): Payer: Medicare Other | Admitting: *Deleted

## 2011-06-27 DIAGNOSIS — Z7901 Long term (current) use of anticoagulants: Secondary | ICD-10-CM

## 2011-06-27 DIAGNOSIS — I4891 Unspecified atrial fibrillation: Secondary | ICD-10-CM

## 2011-06-27 LAB — POCT INR: INR: 2.1

## 2011-06-28 ENCOUNTER — Ambulatory Visit (INDEPENDENT_AMBULATORY_CARE_PROVIDER_SITE_OTHER): Payer: Medicare Other | Admitting: Family

## 2011-06-28 ENCOUNTER — Encounter: Payer: Self-pay | Admitting: Family

## 2011-06-28 VITALS — BP 160/80 | HR 72 | Temp 98.3°F | Resp 16 | Ht 75.0 in | Wt 280.1 lb

## 2011-06-28 DIAGNOSIS — I10 Essential (primary) hypertension: Secondary | ICD-10-CM

## 2011-06-28 MED ORDER — CLONIDINE HCL 0.1 MG PO TABS: 0.1000 mg | ORAL_TABLET | Freq: Two times a day (BID) | ORAL | Status: DC

## 2011-06-28 NOTE — Patient Instructions (Signed)
Please follow up in 1 month.  

## 2011-06-28 NOTE — Progress Notes (Signed)
Subjective:    Patient ID: Bruce Cole, male    DOB: 1940-12-14, 70 y.o.   MRN: [id number]  HPI  Patient presents today for followup of hypertension.  Patient has been treated for Chronic HTN for quite sometime. He is currently on diltiazem, maxide and enalapril and is uncontrolled. No associated S/S related to HTN.   Quality: chronic Modifying factor: meds Duration: Quite sometime Associated S/S: None.  The patient denies the following associated symptoms: Chest pain, dyspnea, blurred vision, headache, or lower extremity edema.   Gout- pt reports that this has been well controlled.  Review of Systems    see HPI  Past Medical History  Diagnosis Date  . Nephrolithiasis   . Arthritis     rheumatoid  . Colon polyp 2000  . Atrial fibrillation   . Unspecified essential hypertension   . Gout     History   Social History  . Marital Status: Single    Spouse Name: N/A    Number of Children: 3  . Years of Education: N/A   Occupational History  .     Social History Main Topics  . Smoking status: Never Smoker   . Smokeless tobacco: Never Used  . Alcohol Use: Yes     occasional  . Drug Use: No  . Sexually Active: Not on file   Other Topics Concern  . 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 Spine surgery     x 2  . Joint replacement     Total L-Hip replacement, Right Knee 10/20/09  . Cholecystectomy 1994    Family History  Problem Relation Age of Onset  . Hypertension Mother   . Arthritis Mother     ?RA  . Hypertension Father     No Known Allergies  Current Outpatient Prescriptions on File Prior to Visit  Medication Sig Dispense Refill  . allopurinol (ZYLOPRIM) 100 MG tablet take 2 tablets by mouth once daily  180 tablet  1  . COLCRYS 0.6 MG tablet take 2 tablets by mouth AT START OF GOUT FLARE AND 1 TABLET 3 HOURS LATER. MAX 3 TABLETS PER GOUT ATTACK.  20  tablet  1  . diltiazem (CARDIZEM) 60 MG tablet take 2 tablets by mouth twice a day  180 tablet  1  . DIOVAN 160 MG tablet take 1 tablet by mouth once daily  90 tablet  0  . enalapril (VASOTEC) 10 MG tablet take 1 tablet by mouth twice a day  180 tablet  1  . sildenafil (VIAGRA) 100 MG tablet Take 100 mg by mouth daily as needed.        . triamterene-hydrochlorothiazide (MAXZIDE-25) 37.5-25 MG per tablet take 1 tablet by mouth once daily  90 tablet  1  . warfarin (COUMADIN) 7.5 MG tablet take as directed [place].  40 tablet  3  . methocarbamol (ROBAXIN) 500 MG tablet Take 500 mg by mouth 4 (four) times daily as needed.          BP 160/80  Pulse 72  Temp(Src) 98.3 F (36.8 C) (Oral)  Resp 16  Ht 6\' 3"  (1.905 m)  Wt 280 lb 1.3 oz (127.043 kg)  BMI 35.01 kg/m2    Objective:   Physical Exam  Constitutional: He appears well-developed and well-nourished. No distress.  HENT:  Head: Normocephalic and atraumatic.  Cardiovascular: Normal rate and  regular rhythm.   No murmur heard. Pulmonary/Chest: Effort normal and breath sounds normal. No respiratory distress. He has no wheezes. He has no rales. He exhibits no tenderness.  Musculoskeletal: He exhibits no edema.  Psychiatric: He has a normal mood and affect. His behavior is normal. Judgment and thought content normal.          Assessment & Plan:

## 2011-06-28 NOTE — Assessment & Plan Note (Addendum)
BP Readings from Last 3 Encounters:  06/28/11 160/80  04/11/11 158/80  03/29/11 140/90   Deteriorated. Will add clonidine.

## 2011-07-25 ENCOUNTER — Ambulatory Visit (INDEPENDENT_AMBULATORY_CARE_PROVIDER_SITE_OTHER): Payer: Medicare Other | Admitting: *Deleted

## 2011-07-25 ENCOUNTER — Telehealth: Payer: Self-pay | Admitting: *Deleted

## 2011-07-25 DIAGNOSIS — Z7901 Long term (current) use of anticoagulants: Secondary | ICD-10-CM | POA: Diagnosis not present

## 2011-07-25 DIAGNOSIS — I4891 Unspecified atrial fibrillation: Secondary | ICD-10-CM | POA: Diagnosis not present

## 2011-07-25 LAB — POCT INR: INR: 2.1

## 2011-07-25 MED ORDER — TRIAMTERENE-HCTZ 37.5-25 MG PO TABS: 1.0000 | ORAL_TABLET | Freq: Every day | ORAL | Status: DC

## 2011-07-25 MED ORDER — ALLOPURINOL 100 MG PO TABS: 200.0000 mg | ORAL_TABLET | Freq: Every day | ORAL | Status: DC

## 2011-07-25 NOTE — Telephone Encounter (Signed)
Received call from [facility] requesting refills on pts allopurinol and triamterene hct. 90 day supply of each medication x no refills called to [facility].

## 2011-07-28 DIAGNOSIS — H2510 Age-related nuclear cataract, unspecified eye: Secondary | ICD-10-CM | POA: Diagnosis not present

## 2011-07-28 DIAGNOSIS — M069 Rheumatoid arthritis, unspecified: Secondary | ICD-10-CM | POA: Diagnosis not present

## 2011-07-28 DIAGNOSIS — L608 Other nail disorders: Secondary | ICD-10-CM | POA: Diagnosis not present

## 2011-07-28 DIAGNOSIS — L84 Corns and callosities: Secondary | ICD-10-CM | POA: Diagnosis not present

## 2011-07-28 DIAGNOSIS — I739 Peripheral vascular disease, unspecified: Secondary | ICD-10-CM | POA: Diagnosis not present

## 2011-07-28 DIAGNOSIS — Z79899 Other long term (current) drug therapy: Secondary | ICD-10-CM | POA: Diagnosis not present

## 2011-08-03 ENCOUNTER — Telehealth: Payer: Self-pay | Admitting: *Deleted

## 2011-08-03 MED ORDER — VALSARTAN 160 MG PO TABS: 160.0000 mg | ORAL_TABLET | Freq: Every day | ORAL | Status: DC

## 2011-08-03 NOTE — Telephone Encounter (Signed)
Received call from Sam at [facility] requesting refill on Diovan 160mg . Auth. given for #90 x no refills.

## 2011-08-03 NOTE — Telephone Encounter (Signed)
Received message from [facility] requesting a call back. Attempted to reach pt this morning but pharmacy does not open until 9am. Will try back later.

## 2011-08-09 ENCOUNTER — Encounter: Payer: Self-pay | Admitting: Family

## 2011-08-09 ENCOUNTER — Ambulatory Visit (INDEPENDENT_AMBULATORY_CARE_PROVIDER_SITE_OTHER): Payer: Medicare Other | Admitting: Family

## 2011-08-09 DIAGNOSIS — I10 Essential (primary) hypertension: Secondary | ICD-10-CM

## 2011-08-09 DIAGNOSIS — R609 Edema, unspecified: Secondary | ICD-10-CM | POA: Insufficient documentation

## 2011-08-09 MED ORDER — FUROSEMIDE 20 MG PO TABS: 20.0000 mg | ORAL_TABLET | Freq: Every day | ORAL | Status: DC

## 2011-08-09 MED ORDER — ENALAPRIL MALEATE 20 MG PO TABS: 20.0000 mg | ORAL_TABLET | Freq: Every day | ORAL | Status: DC

## 2011-08-09 NOTE — Assessment & Plan Note (Signed)
Slightly improved.  He reports that his blood pressures have been better at home.  He is on ACE and ARB.  I would like to eliminate one of these medications. The ARB is very expensive for him.  D/C diovan, increase Enalapril, continue cardizem and clonidine. He will call us in 1 week with his BP readings. Plan bmet next visit.

## 2011-08-09 NOTE — Assessment & Plan Note (Signed)
Deteriorated, pt instructed to limit sodium intake, will d/c maxide and start lasix.

## 2011-08-09 NOTE — Patient Instructions (Signed)
Please call Bruce Cole in 1 week with your blood pressure readings. Follow up in 1 month.

## 2011-08-09 NOTE — Progress Notes (Signed)
Subjective:    Patient ID: Bruce Cole, male    DOB: 03-05-1941, 71 y.o.   MRN: [name]  HPI  Mr.  Cole is a 71 yr old male who presents today for follow up  1) Edema- Pt notes that his LE edema has been worse recently, especially in the left foot. He denies associated gout pain.   2) HTN-  Tolerating the clonidine.  Reports that his pressure has been 120/70's at home.    Review of Systems    see HPI  Past Medical History  Diagnosis Date  . Nephrolithiasis   . Arthritis     rheumatoid  . Colon polyp 2000  . Atrial fibrillation   . Unspecified essential hypertension   . Gout     History   Social History  . Marital Status: Single    Spouse Name: N/A    Number of Children: 3  . Years of Education: N/A   Occupational History  .     Social History Main Topics  . Smoking status: Never Smoker   . Smokeless tobacco: Never Used  . Alcohol Use: Yes     occasional  . Drug Use: No  . Sexually Active: Not on file   Other Topics Concern  . 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 Spine surgery     x 2  . Joint replacement     Total L-Hip replacement, Right Knee 10/20/09  . Cholecystectomy 1994    Family History  Problem Relation Age of Onset  . Hypertension Mother   . Arthritis Mother     ?RA  . Hypertension Father     No Known Allergies  Current Outpatient Prescriptions on File Prior to Visit  Medication Sig Dispense Refill  . allopurinol (ZYLOPRIM) 100 MG tablet Take 2 tablets (200 mg total) by mouth daily.  180 tablet  0  . cloNIDine (CATAPRES) 0.1 MG tablet Take 1 tablet (0.1 mg total) by mouth 2 (two) times daily.  60 tablet  2  . COLCRYS 0.6 MG tablet take 2 tablets by mouth AT START OF GOUT FLARE AND 1 TABLET 3 HOURS LATER. MAX 3 TABLETS PER GOUT ATTACK.  20 tablet  1  . diltiazem (CARDIZEM) 60 MG tablet take 2 tablets by mouth twice a day  180 tablet   1  . hydroxychloroquine (PLAQUENIL) 200 MG tablet Take 1 tablet by mouth Twice daily.      . methocarbamol (ROBAXIN) 500 MG tablet Take 500 mg by mouth 4 (four) times daily as needed.        . sildenafil (VIAGRA) 100 MG tablet Take 100 mg by mouth daily as needed.        . warfarin (COUMADIN) 7.5 MG tablet take as directed BY ANTICOAGULATION CLINIC.  40 tablet  3    BP 150/90  Pulse 56  Temp(Src) 98.3 F (36.8 C) (Oral)  Resp 16  Ht 6\' 3"  (AB-123456789 m)  Wt 289 lb (131.09 kg)  BMI 36.12 kg/m2    Objective:   Physical Exam  Constitutional: He appears well-developed and well-nourished. No distress.  Cardiovascular: Normal rate.  An irregular rhythm present.  Pulmonary/Chest: Effort normal and breath sounds normal. No respiratory distress. He has no wheezes. He has no rales. He exhibits no tenderness.  Musculoskeletal:       Slight swelling of left ankle.  Skin:  Skin is warm and dry.  Psychiatric: He has a normal mood and affect. His behavior is normal. Judgment and thought content normal.          Assessment & Plan:

## 2011-08-17 ENCOUNTER — Telehealth: Payer: Self-pay | Admitting: *Deleted

## 2011-08-17 NOTE — Telephone Encounter (Signed)
Left detailed message on home voicemail and to call if any questions.

## 2011-08-17 NOTE — Telephone Encounter (Signed)
BP looks good. Continue current meds.  Follow up in February as scheduled.

## 2011-08-17 NOTE — Telephone Encounter (Signed)
Pt called to report BP readings over the last week. Reports med compliance. Please advise.  08/09/11   140/90 08/10/11   135/80 08/11/11   143/78 08/12/11   136/82 08/13/11   138/80 08/14/11   134/78 08/15/11   128/70 08/16/11   124/68

## 2011-08-23 ENCOUNTER — Emergency Department (HOSPITAL_BASED_OUTPATIENT_CLINIC_OR_DEPARTMENT_OTHER)
Admission: EM | Admit: 2011-08-23 | Discharge: 2011-08-23 | Disposition: A | Discharge status: Home / Self Care | Payer: Medicare Other | Attending: Emergency Medicine | Admitting: Emergency Medicine

## 2011-08-23 ENCOUNTER — Encounter (HOSPITAL_BASED_OUTPATIENT_CLINIC_OR_DEPARTMENT_OTHER): Payer: Self-pay

## 2011-08-23 DIAGNOSIS — M7989 Other specified soft tissue disorders: Secondary | ICD-10-CM | POA: Insufficient documentation

## 2011-08-23 DIAGNOSIS — I4891 Unspecified atrial fibrillation: Secondary | ICD-10-CM | POA: Diagnosis not present

## 2011-08-23 DIAGNOSIS — I10 Essential (primary) hypertension: Secondary | ICD-10-CM | POA: Insufficient documentation

## 2011-08-23 MED ORDER — FUROSEMIDE 40 MG PO TABS: 40.0000 mg | ORAL_TABLET | Freq: Every day | ORAL | Status: DC

## 2011-08-23 MED ORDER — VALSARTAN 160 MG PO TABS: 160.0000 mg | ORAL_TABLET | Freq: Every day | ORAL | Status: DC

## 2011-08-23 NOTE — ED Notes (Signed)
Secondary Assessment- Pt reports elevated blood pressure this am and reports a recent change in medication from PMD.  Denies any pain or other symptoms.

## 2011-08-23 NOTE — ED Provider Notes (Signed)
History     CSN: [id number]  Arrival date & time 08/23/11  0806   None     Chief Complaint  Patient presents with  . Hypertension    (Consider location/radiation/quality/duration/timing/severity/associated sxs/prior treatment) Patient is a 71 y.o. male presenting with hypertension. The history is provided by the patient. No language interpreter was used.  Hypertension This is a chronic problem. Episode onset: Patient has been hypertensive for over 30 years. Recently his blood pressure has gone high, and he has developed peripheral edema, despite a recent change in medication. The problem has been gradually worsening. Pertinent negatives include no chest pain, no abdominal pain, no headaches and no shortness of breath. The symptoms are aggravated by nothing. The symptoms are relieved by nothing. Treatments tried: He has had a recent change in high blood pressure medications.    Past Medical History  Diagnosis Date  . Nephrolithiasis   . Arthritis     rheumatoid  . Colon polyp 2000  . Atrial fibrillation   . Unspecified essential hypertension   . Gout     Past Surgical History  Procedure Date  . Spine surgery     x 2  . Joint replacement     Total L-Hip replacement, Right Knee 10/20/09  . Cholecystectomy 1994    Family History  Problem Relation Age of Onset  . Hypertension Mother   . Arthritis Mother     ?RA  . Hypertension Father     History  Substance Use Topics  . Smoking status: Never Smoker   . Smokeless tobacco: Never Used  . Alcohol Use: Yes     occasional      Review of Systems  Constitutional: Negative.   HENT: Negative.   Eyes: Negative.   Respiratory: Negative.  Negative for shortness of breath.   Cardiovascular: Positive for leg swelling. Negative for chest pain.       Hypertension  Gastrointestinal: Negative for abdominal pain.  Genitourinary: Negative.   Musculoskeletal: Negative.   Neurological: Negative.  Negative for headaches.    Psychiatric/Behavioral: Negative.     Allergies  Review of patient's allergies indicates no known allergies.  Home Medications   Current Outpatient Rx  Name Route Sig Dispense Refill  . ALLOPURINOL 100 MG PO TABS Oral Take 2 tablets (200 mg total) by mouth daily. 180 tablet 0  . CLONIDINE HCL 0.1 MG PO TABS Oral Take 1 tablet (0.1 mg total) by mouth 2 (two) times daily. 60 tablet 2  . COLCRYS 0.6 MG PO TABS  take 2 tablets by mouth AT START OF GOUT FLARE AND 1 TABLET 3 HOURS LATER. MAX 3 TABLETS PER GOUT ATTACK. 20 tablet 1  . DILTIAZEM HCL 60 MG PO TABS  take 2 tablets by mouth twice a day 180 tablet 1  . ENALAPRIL MALEATE 20 MG PO TABS Oral Take 1 tablet (20 mg total) by mouth daily. 30 tablet 2  . FUROSEMIDE 20 MG PO TABS Oral Take 1 tablet (20 mg total) by mouth daily. 30 tablet 3  . HYDROXYCHLOROQUINE SULFATE 200 MG PO TABS Oral Take 1 tablet by mouth Twice daily.    [name] HYDROXYCHLOROQUINE SULFATE 200 MG PO TABS Oral Take 1 tablet by mouth Twice daily.    [name] METHOCARBAMOL 500 MG PO TABS Oral Take 500 mg by mouth 4 (four) times daily as needed.      [name] SILDENAFIL CITRATE 100 MG PO TABS Oral Take 100 mg by mouth daily as needed.      [name]  WARFARIN SODIUM 7.5 MG PO TABS  take as directed BY ANTICOAGULATION CLINIC. 40 tablet 3    BP 188/90  Pulse 60  Temp(Src) 97.7 F (36.5 C) (Oral)  Resp 16  Ht 6\' 3"  (1.905 m)  Wt 270 lb (122.471 kg)  BMI 33.75 kg/m2  SpO2 100%  Physical Exam  Nursing note and vitals reviewed. Constitutional:       Robust man in no distress  HENT:  Head: Normocephalic and atraumatic.  Right Ear: External ear normal.  Left Ear: External ear normal.  Mouth/Throat: Oropharynx is clear and moist.  Eyes: Conjunctivae and EOM are normal. Pupils are equal, round, and reactive to light.  Neck: Normal range of motion. Neck supple. No JVD present.  Cardiovascular: Normal rate, regular rhythm and normal heart sounds.   Pulmonary/Chest: Effort normal and breath sounds  normal.  Abdominal: Soft. Bowel sounds are normal.  Musculoskeletal:       He has 2+ edema in the pretibial regions bilaterally  Lymphadenopathy:    He has no cervical adenopathy.  Skin: Skin is warm and dry.  Psychiatric: He has a normal mood and affect. His behavior is normal.    ED Course  Procedures (including critical care time)  Pt's BP is high.  He has developed peripheral edema.  I reviewed his medicines with him.  Advised to stop enalapril, increase Lasix to 40 mg bid, start Diovan 160 mg qd. F/U with Debbrah Alar, RNP in one week in the office.    1. Hypertension           Mylinda Latina III, MD 08/24/11 [phone]

## 2011-08-23 NOTE — ED Notes (Signed)
Pt reports hypertension this am when he checked his blood pressure.  Recently changed medications.

## 2011-08-24 ENCOUNTER — Ambulatory Visit (INDEPENDENT_AMBULATORY_CARE_PROVIDER_SITE_OTHER): Payer: Medicare Other | Admitting: *Deleted

## 2011-08-24 DIAGNOSIS — Z7901 Long term (current) use of anticoagulants: Secondary | ICD-10-CM | POA: Diagnosis not present

## 2011-08-24 DIAGNOSIS — I4891 Unspecified atrial fibrillation: Secondary | ICD-10-CM | POA: Diagnosis not present

## 2011-09-12 ENCOUNTER — Ambulatory Visit (INDEPENDENT_AMBULATORY_CARE_PROVIDER_SITE_OTHER): Payer: Medicare Other | Admitting: Family

## 2011-09-12 ENCOUNTER — Encounter: Payer: Self-pay | Admitting: Family

## 2011-09-12 VITALS — BP 146/70 | HR 46 | Temp 97.9°F | Resp 16 | Ht 75.0 in | Wt 279.0 lb

## 2011-09-12 DIAGNOSIS — I10 Essential (primary) hypertension: Secondary | ICD-10-CM | POA: Diagnosis not present

## 2011-09-12 LAB — BASIC METABOLIC PANEL
BUN: 25 mg/dL — ABNORMAL HIGH (ref 6–23)
Chloride: 100 mEq/L (ref 96–112)
Creat: 1.17 mg/dL (ref 0.50–1.35)
Glucose, Bld: 88 mg/dL (ref 70–99)
Potassium: 3.9 mEq/L (ref 3.5–5.3)

## 2011-09-12 NOTE — Progress Notes (Signed)
Subjective:    Patient ID: Bruce Cole, male    DOB: Nov 25, 1940, 71 y.o.   MRN: [name]  HPI  Mr.  Bruce Cole is a 71 yr old male who presents today for follow up.  He was seenon 2/5 in the ED due to uncontrolled HTN.  His enalapril was discontinued, diovan restarted and lasix increased.  Since that time he notes feeling well. Reports significant improvement in his lower extremity edema. Reports that his home BP readings have been stable- generally running 130/70.   Gout- denies any recent gout flare since his furosemide has been increased.    Review of Systems See HPI  Past Medical History  Diagnosis Date  . Nephrolithiasis   . Arthritis     rheumatoid  . Colon polyp 2000  . Atrial fibrillation   . Unspecified essential hypertension   . Gout     History   Social History  . Marital Status: Single    Spouse Name: N/A    Number of Children: 3  . Years of Education: N/A   Occupational History  .     Social History Main Topics  . Smoking status: Never Smoker   . Smokeless tobacco: Never Used  . Alcohol Use: Yes     occasional  . Drug Use: No  . Sexually Active: Not on file   Other Topics Concern  . 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 Spine surgery     x 2  . Joint replacement     Total L-Hip replacement, Right Knee 10/20/09  . Cholecystectomy 1994    Family History  Problem Relation Age of Onset  . Hypertension Mother   . Arthritis Mother     ?RA  . Hypertension Father     No Known Allergies  Current Outpatient Prescriptions on File Prior to Visit  Medication Sig Dispense Refill  . allopurinol (ZYLOPRIM) 100 MG tablet Take 2 tablets (200 mg total) by mouth daily.  180 tablet  0  . cloNIDine (CATAPRES) 0.1 MG tablet Take 1 tablet (0.1 mg total) by mouth 2 (two) times daily.  60 tablet  2  . COLCRYS 0.6 MG tablet take 2 tablets by mouth AT START OF GOUT  FLARE AND 1 TABLET 3 HOURS LATER. MAX 3 TABLETS PER GOUT ATTACK.  20 tablet  1  . hydroxychloroquine (PLAQUENIL) 200 MG tablet Take 1 tablet by mouth Twice daily.      . methocarbamol (ROBAXIN) 500 MG tablet Take 500 mg by mouth 4 (four) times daily as needed.        . sildenafil (VIAGRA) 100 MG tablet Take 100 mg by mouth daily as needed.        . valsartan (DIOVAN) 160 MG tablet Take 1 tablet (160 mg total) by mouth daily.  30 tablet  0  . warfarin (COUMADIN) 7.5 MG tablet take as directed [place].  40 tablet  3    BP 146/70  Pulse 46  Temp(Src) 97.9 F (36.6 C) (Oral)  Resp 16  Ht 6\' 3"  (1.905 m)  Wt 279 lb (126.554 kg)  BMI 34.87 kg/m2  SpO2 98%       Objective:   Physical Exam  Constitutional: He is oriented to person, place, and time. He appears well-developed and well-nourished. No distress.  Cardiovascular: Normal rate and regular rhythm.   No murmur heard.  Pulmonary/Chest: Effort normal and breath sounds normal. No respiratory distress. He has no wheezes. He has no rales. He exhibits no tenderness.  Musculoskeletal: He exhibits no edema.  Neurological: He is alert and oriented to person, place, and time.          Assessment & Plan:

## 2011-09-12 NOTE — Assessment & Plan Note (Signed)
BP control is reasonable on current meds. Continue same.  Obtain BMET.  Follow up in 6 weeks.

## 2011-09-12 NOTE — Patient Instructions (Signed)
Please follow up in 6 weeks.

## 2011-09-14 ENCOUNTER — Encounter: Payer: Self-pay | Admitting: Family

## 2011-09-14 ENCOUNTER — Other Ambulatory Visit: Payer: Self-pay | Admitting: *Deleted

## 2011-09-14 MED ORDER — FUROSEMIDE 40 MG PO TABS: 40.0000 mg | ORAL_TABLET | Freq: Two times a day (BID) | ORAL | Status: DC

## 2011-09-14 MED ORDER — DILTIAZEM HCL 60 MG PO TABS: ORAL_TABLET | ORAL | Status: DC

## 2011-09-14 MED ORDER — COLCHICINE 0.6 MG PO TABS: ORAL_TABLET | ORAL | Status: DC

## 2011-09-14 MED ORDER — VALSARTAN 160 MG PO TABS: 160.0000 mg | ORAL_TABLET | Freq: Every day | ORAL | Status: DC

## 2011-09-14 NOTE — Telephone Encounter (Signed)
Received call from [facility] requesting refills for: Diovan, Colcrys, Diltiazem and Furosemide. Refills sent to pharmacy for 1 month supply x 1 refill on all but Colcrys.  Colcrys sent #20 x no refills.

## 2011-09-23 ENCOUNTER — Ambulatory Visit (INDEPENDENT_AMBULATORY_CARE_PROVIDER_SITE_OTHER): Payer: Medicare Other | Admitting: *Deleted

## 2011-09-23 DIAGNOSIS — Z7901 Long term (current) use of anticoagulants: Secondary | ICD-10-CM

## 2011-09-23 DIAGNOSIS — I4891 Unspecified atrial fibrillation: Secondary | ICD-10-CM

## 2011-09-23 LAB — POCT INR: INR: 2.2

## 2011-10-10 DIAGNOSIS — L608 Other nail disorders: Secondary | ICD-10-CM | POA: Diagnosis not present

## 2011-10-10 DIAGNOSIS — I739 Peripheral vascular disease, unspecified: Secondary | ICD-10-CM | POA: Diagnosis not present

## 2011-10-10 DIAGNOSIS — L84 Corns and callosities: Secondary | ICD-10-CM | POA: Diagnosis not present

## 2011-10-12 DIAGNOSIS — M25559 Pain in unspecified hip: Secondary | ICD-10-CM | POA: Diagnosis not present

## 2011-10-12 DIAGNOSIS — M25539 Pain in unspecified wrist: Secondary | ICD-10-CM | POA: Diagnosis not present

## 2011-10-12 DIAGNOSIS — M109 Gout, unspecified: Secondary | ICD-10-CM | POA: Diagnosis not present

## 2011-10-24 ENCOUNTER — Encounter: Payer: Self-pay | Admitting: Family

## 2011-10-24 ENCOUNTER — Ambulatory Visit (INDEPENDENT_AMBULATORY_CARE_PROVIDER_SITE_OTHER): Payer: Medicare Other | Admitting: Family

## 2011-10-24 VITALS — BP 150/76 | HR 54 | Temp 98.3°F | Resp 16 | Ht 75.0 in | Wt 284.1 lb

## 2011-10-24 DIAGNOSIS — I10 Essential (primary) hypertension: Secondary | ICD-10-CM

## 2011-10-24 DIAGNOSIS — R609 Edema, unspecified: Secondary | ICD-10-CM | POA: Diagnosis not present

## 2011-10-24 DIAGNOSIS — M069 Rheumatoid arthritis, unspecified: Secondary | ICD-10-CM | POA: Diagnosis not present

## 2011-10-24 DIAGNOSIS — M109 Gout, unspecified: Secondary | ICD-10-CM

## 2011-10-24 NOTE — Assessment & Plan Note (Signed)
BP Readings from Last 3 Encounters:  10/24/11 150/76  09/12/11 146/70  08/23/11 174/108  BP higher today in office than last visit, however he reports much better BP readings at home.  Suspect an element of white coat HTN.  Continue current meds.  Advised pt to continue to monitor his BP at home and call us if he starts to have higher readings at home.

## 2011-10-24 NOTE — Assessment & Plan Note (Signed)
Stable on allopurinol and prn colcrys.  

## 2011-10-24 NOTE — Assessment & Plan Note (Signed)
Clinically stable. Managed by Dr. Estanislado Pandy- Rheumatology.

## 2011-10-24 NOTE — Assessment & Plan Note (Signed)
Stable and improved.  Continue Furosemide.

## 2011-10-24 NOTE — Patient Instructions (Signed)
Please follow up in 3 months for a fasting Wellness Visit. Come fasting to this visit.

## 2011-10-24 NOTE — Progress Notes (Signed)
Subjective:    Patient ID: Bruce Cole, male    DOB: 09-Jun-1941, 71 y.o.   MRN: [name]  HPI  Mr.  Cole is a 71 yr old male who presents today for follow up of his HTN.    HTN-  Reports that he has been monitoring his BP at home. BP reading 120-130's over 60's to 70's.    RA- Reports RA is "better." Able to play 3-4 days in a row.  Walked course on Saturday.    Gout- Stable, despite increase in his diuretics.   Edema- stable.     Review of Systems See HPI  Past Medical History  Diagnosis Date  . Nephrolithiasis   . Arthritis     rheumatoid  . Colon polyp 2000  . Atrial fibrillation   . Unspecified essential hypertension   . Gout     History   Social History  . Marital Status: Single    Spouse Name: N/A    Number of Children: 3  . Years of Education: N/A   Occupational History  .     Social History Main Topics  . Smoking status: Never Smoker   . Smokeless tobacco: Never Used  . Alcohol Use: Yes     occasional  . Drug Use: No  . Sexually Active: Not on file   Other Topics Concern  . 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 Spine surgery     x 2  . Joint replacement     Total L-Hip replacement, Right Knee 10/20/09  . Cholecystectomy 1994    Family History  Problem Relation Age of Onset  . Hypertension Mother   . Arthritis Mother     ?RA  . Hypertension Father     No Known Allergies  Current Outpatient Prescriptions on File Prior to Visit  Medication Sig Dispense Refill  . allopurinol (ZYLOPRIM) 100 MG tablet Take 2 tablets (200 mg total) by mouth daily.  180 tablet  0  . cloNIDine (CATAPRES) 0.1 MG tablet Take 1 tablet (0.1 mg total) by mouth 2 (two) times daily.  60 tablet  2  . colchicine (COLCRYS) 0.6 MG tablet Take 2 tablets by mouth AT START OF GOUT FLARE AND 1 TABLET 3 HOURS LATER. MAX 3 TABLETS PER GOUT ATTACK.  20 tablet  0  . diltiazem  (CARDIZEM) 60 MG tablet Take 2 tablets twice a day  120 tablet  1  . furosemide (LASIX) 40 MG tablet Take 1 tablet (40 mg total) by mouth 2 (two) times daily.  60 tablet  1  . hydroxychloroquine (PLAQUENIL) 200 MG tablet Take 1 tablet by mouth Twice daily.      . methocarbamol (ROBAXIN) 500 MG tablet Take 500 mg by mouth 4 (four) times daily as needed.        . sildenafil (VIAGRA) 100 MG tablet Take 100 mg by mouth daily as needed.        . valsartan (DIOVAN) 160 MG tablet Take 1 tablet (160 mg total) by mouth daily.  30 tablet  1  . warfarin (COUMADIN) 7.5 MG tablet take as directed [place].  40 tablet  3    BP 150/76  Pulse 54  Temp(Src) 98.3 F (36.8 C) (Oral)  Resp 16  Ht 6\' 3"  (1.905 m)  Wt 284 lb 1.3 oz (128.858 kg)  BMI 35.51 kg/m2  SpO2 98%       Objective:   Physical Exam  Constitutional: He is oriented to person, place, and time. He appears well-developed and well-nourished. No distress.  Cardiovascular: Normal rate.  An irregular rhythm present.  No murmur heard. Pulmonary/Chest: Effort normal and breath sounds normal. No respiratory distress. He has no wheezes. He has no rales. He exhibits no tenderness.  Musculoskeletal: He exhibits no edema.  Neurological: He is alert and oriented to person, place, and time.  Skin: Skin is warm and dry. No erythema.  Psychiatric: He has a normal mood and affect. His behavior is normal. Judgment and thought content normal.          Assessment & Plan:

## 2011-10-26 ENCOUNTER — Telehealth: Payer: Self-pay | Admitting: Family

## 2011-10-26 NOTE — Telephone Encounter (Signed)
Refill- allopurinol 100mg  tab. Take two tablets by mouth daily. Qty 180   Refill- clonidine hcl 0.1mg . 1 PO BID qty 60

## 2011-10-26 NOTE — Telephone Encounter (Signed)
Call placed to patient at [phone], no answer. A voice message was left for patient to return phone call regarding medication clarification on allopurinol.

## 2011-10-27 ENCOUNTER — Other Ambulatory Visit: Payer: Self-pay | Admitting: *Deleted

## 2011-10-27 MED ORDER — CLONIDINE HCL 0.1 MG PO TABS: 0.1000 mg | ORAL_TABLET | Freq: Two times a day (BID) | ORAL | Status: DC

## 2011-10-27 MED ORDER — WARFARIN SODIUM 7.5 MG PO TABS: 7.5000 mg | ORAL_TABLET | ORAL | Status: DC

## 2011-10-27 MED ORDER — ALLOPURINOL 100 MG PO TABS: 200.0000 mg | ORAL_TABLET | Freq: Every day | ORAL | Status: DC

## 2011-10-27 NOTE — Telephone Encounter (Signed)
Rx refill sent to pharmacy. 

## 2011-10-27 NOTE — Telephone Encounter (Signed)
Patient states that he takes   Allopurinol 100mg . Twice daily.

## 2011-10-28 ENCOUNTER — Ambulatory Visit (INDEPENDENT_AMBULATORY_CARE_PROVIDER_SITE_OTHER): Payer: Medicare Other | Admitting: *Deleted

## 2011-10-28 DIAGNOSIS — I4891 Unspecified atrial fibrillation: Secondary | ICD-10-CM

## 2011-10-28 DIAGNOSIS — Z7901 Long term (current) use of anticoagulants: Secondary | ICD-10-CM | POA: Diagnosis not present

## 2011-10-28 LAB — POCT INR: INR: 2.5

## 2011-11-02 ENCOUNTER — Telehealth: Payer: Self-pay | Admitting: Family

## 2011-11-02 MED ORDER — COLCHICINE 0.6 MG PO TABS: ORAL_TABLET | ORAL | Status: DC

## 2011-11-02 NOTE — Telephone Encounter (Signed)
Refill sent to pharmacy #30 x 3 refills.

## 2011-11-02 NOTE — Telephone Encounter (Signed)
Refill- colcrys 0.6mg  tablet. Take two tablets by mouth at onset of gout flares and one tablet three hours later. Max 3 tablets per gout attack. Qty 20 last fill 2.27.13

## 2011-11-11 ENCOUNTER — Telehealth: Payer: Self-pay | Admitting: Family

## 2011-11-11 MED ORDER — FUROSEMIDE 40 MG PO TABS: 40.0000 mg | ORAL_TABLET | Freq: Two times a day (BID) | ORAL | Status: DC

## 2011-11-11 MED ORDER — DILTIAZEM HCL 60 MG PO TABS: ORAL_TABLET | ORAL | Status: DC

## 2011-11-11 NOTE — Telephone Encounter (Signed)
Refill- diltiazem hcl 60mg  tab. Take two tablets by mouth two times a day. Qty 120 last fill 3.27.13  Refill- furosemide 40mg  tablets. Take one tablet by mouth two times a day. Qty 89 last fill 3.27.13

## 2011-11-11 NOTE — Telephone Encounter (Signed)
Rx refill sent to pharmacy. 

## 2011-11-11 NOTE — Telephone Encounter (Signed)
Received call from Oneita Kras at [facility] stating they have not received refills we sent previously for furosemide and diltiazem. Verbal given per previous authorization.

## 2011-11-28 ENCOUNTER — Other Ambulatory Visit: Payer: Self-pay | Admitting: *Deleted

## 2011-11-28 MED ORDER — VALSARTAN 160 MG PO TABS: 160.0000 mg | ORAL_TABLET | Freq: Every day | ORAL | Status: DC

## 2011-11-28 NOTE — Telephone Encounter (Signed)
Refill called to pharmacist for Diovan 160mg  #30 x 1 refill.

## 2011-12-09 ENCOUNTER — Ambulatory Visit (INDEPENDENT_AMBULATORY_CARE_PROVIDER_SITE_OTHER): Payer: Medicare Other

## 2011-12-09 DIAGNOSIS — I4891 Unspecified atrial fibrillation: Secondary | ICD-10-CM | POA: Diagnosis not present

## 2011-12-09 DIAGNOSIS — Z7901 Long term (current) use of anticoagulants: Secondary | ICD-10-CM | POA: Diagnosis not present

## 2011-12-21 ENCOUNTER — Encounter: Payer: Self-pay | Admitting: Internal Medicine

## 2011-12-21 ENCOUNTER — Ambulatory Visit (INDEPENDENT_AMBULATORY_CARE_PROVIDER_SITE_OTHER): Payer: Medicare Other | Admitting: Internal Medicine

## 2011-12-21 VITALS — BP 150/98 | HR 53 | Ht 75.0 in | Wt 278.4 lb

## 2011-12-21 DIAGNOSIS — I4891 Unspecified atrial fibrillation: Secondary | ICD-10-CM | POA: Diagnosis not present

## 2011-12-21 DIAGNOSIS — I10 Essential (primary) hypertension: Secondary | ICD-10-CM | POA: Diagnosis not present

## 2011-12-21 NOTE — Progress Notes (Signed)
HPI [name] returns today for followup. 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 The patient has been saddled by pain in his legs over the last year. He is status post knee replacement in the past. He may require additional surgery. He has had no syncope and denies palpitations or shortness of breath. No chest pain. He has had chronic atrial fibrillation with well-controlled ventricular rates. He admits to some dietary indiscretion. He denies medical noncompliance. No Known Allergies   Current Outpatient Prescriptions  Medication Sig Dispense Refill  . allopurinol (ZYLOPRIM) 100 MG tablet Take 2 tablets (200 mg total) by mouth daily.  180 tablet  0  . cloNIDine (CATAPRES) 0.1 MG tablet Take 1 tablet (0.1 mg total) by mouth 2 (two) times daily.  60 tablet  5  . colchicine (COLCRYS) 0.6 MG tablet Take 2 tablets by mouth AT START OF GOUT FLARE AND 1 TABLET 3 HOURS LATER. MAX 3 TABLETS PER GOUT ATTACK.  20 tablet  3  . diltiazem (CARDIZEM) 60 MG tablet Take 2 tablets twice a day  120 tablet  3  . furosemide (LASIX) 40 MG tablet Take 1 tablet (40 mg total) by mouth 2 (two) times daily.  60 tablet  3  . hydroxychloroquine (PLAQUENIL) 200 MG tablet Take 1 tablet by mouth Twice daily.      . methocarbamol (ROBAXIN) 500 MG tablet Take 500 mg by mouth 4 (four) times daily as needed.        . sildenafil (VIAGRA) 100 MG tablet Take 100 mg by mouth daily as needed.        . traMADol (ULTRAM) 50 MG tablet Take 50 mg by mouth Twice daily.      . valsartan (DIOVAN) 160 MG tablet Take 1 tablet (160 mg total) by mouth daily.  30 tablet  1  . warfarin (COUMADIN) 7.5 MG tablet Take 1 tablet (7.5 mg total) by mouth as directed.  40 tablet  3     Past Medical History  Diagnosis Date  . Nephrolithiasis   . Arthritis     rheumatoid  . Colon polyp 2000  . Atrial fibrillation   . Unspecified essential  hypertension   . Gout     ROS:   All systems reviewed and negative except as noted in the HPI.   Past Surgical History  Procedure Date  . Spine surgery     x 2  . Joint replacement     Total L-Hip replacement, Right Knee 10/20/09  . Cholecystectomy 1994     Family History  Problem Relation Age of Onset  . Hypertension Mother   . Arthritis Mother     ?RA  . Hypertension Father      History   Social History  . Marital Status: Single    Spouse Name: N/A    Number of Children: 3  . Years of Education: N/A   Occupational History  .     Social History Main Topics  . Smoking status: Never Smoker   . Smokeless tobacco: Never Used  . Alcohol Use: Yes     occasional  . Drug Use: No  . Sexually Active: Not on file   Other Topics Concern  . Not on file   Social History Narrative   Former [place] [employer] [place]     BP 150/98  Pulse 53  Ht 6\' 3"  (1.905 m)  Wt  278 lb 6.4 oz (126.281 kg)  BMI 34.80 kg/m2  Physical Exam:  Well appearing 71 year old man NAD HEENT: Unremarkable Neck:  No JVD, no thyromegally Lungs:  Clear with no wheezes, rales, or rhonchi. HEART:  IRegular brady rhythm, no murmurs, no rubs, no clicks Abd:  soft, positive bowel sounds, no organomegally, no rebound, no guarding Ext:  2 plus pulses, no edema, no cyanosis, no clubbing Skin:  No rashes no nodules Neuro:  CN II through XII intact, motor grossly intact  EKG Atrial fibrillation with a slow ventricular response  Assess/Plan:

## 2011-12-21 NOTE — Assessment & Plan Note (Signed)
His ventricular rate is well controlled. He has no palpitations. He will continue warfarin.

## 2011-12-21 NOTE — Assessment & Plan Note (Signed)
His blood pressure is elevated today. I've asked the patient to work hard on losing weight and to let us know his blood pressure remains elevated. Up titration of his medical therapy would be a consideration.

## 2011-12-21 NOTE — Patient Instructions (Signed)
Your physician wants you to follow-up in: 1 year with Dr. Taylor. You will receive a reminder letter in the mail two months in advance. If you don't receive a letter, please call our office to schedule the follow-up appointment.  

## 2011-12-29 DIAGNOSIS — L84 Corns and callosities: Secondary | ICD-10-CM | POA: Diagnosis not present

## 2011-12-29 DIAGNOSIS — I739 Peripheral vascular disease, unspecified: Secondary | ICD-10-CM | POA: Diagnosis not present

## 2011-12-29 DIAGNOSIS — L608 Other nail disorders: Secondary | ICD-10-CM | POA: Diagnosis not present

## 2012-01-23 ENCOUNTER — Ambulatory Visit (INDEPENDENT_AMBULATORY_CARE_PROVIDER_SITE_OTHER): Payer: Medicare Other

## 2012-01-23 DIAGNOSIS — I4891 Unspecified atrial fibrillation: Secondary | ICD-10-CM | POA: Diagnosis not present

## 2012-01-23 DIAGNOSIS — Z7901 Long term (current) use of anticoagulants: Secondary | ICD-10-CM

## 2012-01-26 ENCOUNTER — Telehealth: Payer: Self-pay | Admitting: Family

## 2012-01-26 NOTE — Telephone Encounter (Signed)
Refill-allopurinol 100mg  tab. Take two tablets(200mg  total)by mouth daily. Qty 180 last fill 4.11.13

## 2012-01-27 MED ORDER — ALLOPURINOL 100 MG PO TABS: 200.0000 mg | ORAL_TABLET | Freq: Every day | ORAL | Status: DC

## 2012-01-27 NOTE — Telephone Encounter (Signed)
Pharmacy called requesting status on this medication refill

## 2012-01-27 NOTE — Telephone Encounter (Signed)
Refill sent to pharmacy, #180 x no refills.

## 2012-01-30 ENCOUNTER — Encounter: Payer: Self-pay | Admitting: Family

## 2012-01-30 ENCOUNTER — Ambulatory Visit (INDEPENDENT_AMBULATORY_CARE_PROVIDER_SITE_OTHER): Payer: Medicare Other | Admitting: Family

## 2012-01-30 ENCOUNTER — Ambulatory Visit: Payer: Medicare Other | Admitting: Family

## 2012-01-30 VITALS — BP 150/72 | HR 52 | Temp 97.2°F | Resp 16 | Ht 72.0 in | Wt 285.0 lb

## 2012-01-30 DIAGNOSIS — Z0000 Encounter for general adult medical examination without abnormal findings: Secondary | ICD-10-CM

## 2012-01-30 DIAGNOSIS — I10 Essential (primary) hypertension: Secondary | ICD-10-CM | POA: Diagnosis not present

## 2012-01-30 DIAGNOSIS — D649 Anemia, unspecified: Secondary | ICD-10-CM

## 2012-01-30 DIAGNOSIS — A6000 Herpesviral infection of urogenital system, unspecified: Secondary | ICD-10-CM

## 2012-01-30 DIAGNOSIS — R351 Nocturia: Secondary | ICD-10-CM

## 2012-01-30 DIAGNOSIS — R7309 Other abnormal glucose: Secondary | ICD-10-CM | POA: Diagnosis not present

## 2012-01-30 DIAGNOSIS — I4891 Unspecified atrial fibrillation: Secondary | ICD-10-CM

## 2012-01-30 DIAGNOSIS — Z125 Encounter for screening for malignant neoplasm of prostate: Secondary | ICD-10-CM

## 2012-01-30 DIAGNOSIS — M069 Rheumatoid arthritis, unspecified: Secondary | ICD-10-CM

## 2012-01-30 DIAGNOSIS — M109 Gout, unspecified: Secondary | ICD-10-CM

## 2012-01-30 DIAGNOSIS — R739 Hyperglycemia, unspecified: Secondary | ICD-10-CM

## 2012-01-30 DIAGNOSIS — R350 Frequency of micturition: Secondary | ICD-10-CM

## 2012-01-30 LAB — CBC
Hemoglobin: 14.2 g/dL (ref 13.0–17.0)
MCH: 31.8 pg (ref 26.0–34.0)
MCHC: 35.5 g/dL (ref 30.0–36.0)
MCV: 89.5 fL (ref 78.0–100.0)
RBC: 4.47 MIL/uL (ref 4.22–5.81)

## 2012-01-30 LAB — HEMOGLOBIN A1C: Hgb A1c MFr Bld: 6.3 % — ABNORMAL HIGH (ref <5.7)

## 2012-01-30 LAB — BASIC METABOLIC PANEL
BUN: 14 mg/dL (ref 6–23)
CO2: 30 mEq/L (ref 19–32)
Glucose, Bld: 93 mg/dL (ref 70–99)
Potassium: 3.7 mEq/L (ref 3.5–5.3)
Sodium: 140 mEq/L (ref 135–145)

## 2012-01-30 NOTE — Assessment & Plan Note (Addendum)
Stable on daily allopurinol and prn colchicine. Monitor.

## 2012-01-30 NOTE — Assessment & Plan Note (Signed)
Deteriorated.  He reports much better readings at home.  May be white coat htn.  I have asked him to continue his home meds, check BP daily x 1 week an contact us with readings.

## 2012-01-30 NOTE — Progress Notes (Signed)
Subjective:    Patient ID: Bruce Cole, male    DOB: 08-21-1940, 71 y.o.   MRN: [name]  HPI   Subjective:   Patient here for Medicare annual wellness visit and management of other chronic and acute problems.   HTN- Pt continues BP meds.  Reports much better readings at home than in ofice.  BP Readings from Last 3 Encounters:  01/30/12 150/72  12/21/11 150/98  10/24/11 150/76    RA- Follows with Dr. Estanislado Pandy.  Reports AM stiffness- improves as the day wears on.  Gout- No recent flare. He continues allopurinol.  AF- follows with Dr. Cristopher Peru, coumadin clinic.  Genital herpes- has not flared recently.  Hyperglycemia- No longer following the diabetic diet, wishes to resume and work on weight loss.   [place]- 2011 reports last tetanus <10 yrs ao.  Never had zostavax. Exercising every day- gym 2 days, golf 3 times a week. Reports that he had hip replacement 2004.  [place] for eye exams every 6 months.   Risk factors: heart disease  Roster of Physicians Providing Medical Care to Patient: Dr. Lovena Le Dr. Estanislado Pandy  Activities of Daily Living  In your present state of health, do you have any difficulty performing the following activities? Preparing food and eating?: No  Bathing yourself: No  Getting dressed: No  Using the toilet:No  Moving around from place to place: No  In the past year have you fallen or had a near fall?:No    Home Safety: Has smoke detector and wears seat belts. No firearms. No excess sun exposure.  Diet and Exercise  Current exercise habits: 5x a week Dietary issues discussed: Could eat healthier diet   Depression Screen  (Note: if answer to either of the following is "Yes", then a more complete depression screening is indicated)  Q1: Over the past two weeks, have you felt down, depressed or hopeless?no  Q2: Over the past two weeks, have you felt little interest or pleasure in doing things? no   The following portions of the  patient's history were reviewed and updated as appropriate: allergies, current medications, past family history, past medical history, past social history, past surgical history and problem list.    Objective:   Vision: declined- has upcoming eye exam Hearing: able to hear forced whisper at 6 feet Body mass index: see nursing Cognitive Impairment Assessment: cognition, memory and judgment appear normal.   Assessment:   Medicare wellness utd on preventive parameters   Plan:    During the course of the visit the patient was educated and counseled about appropriate screening and preventive services including:              Nutrition counseling   Vaccines / LABS Pt to check coverage for zostavax and call for nurse visit PSA  Patient Instructions (the written plan) was given to the patient.      Review of Systems  Constitutional: Negative for unexpected weight change.  HENT: Negative for hearing loss.   Eyes: Negative for visual disturbance.  Respiratory: Negative for cough and shortness of breath.   Cardiovascular: Negative for chest pain.  Gastrointestinal: Negative for nausea, vomiting and diarrhea.  Genitourinary: Negative for dysuria and frequency.       +nocturia x 3-4  Musculoskeletal: Positive for arthralgias.  Skin: Negative for rash.  Neurological: Negative for headaches.  Hematological: Negative for adenopathy.  Psychiatric/Behavioral:       Denies depression/anxiety       Objective:  Physical Exam  Constitutional: He is oriented to person, place, and time. He appears well-developed and well-nourished. No distress.  HENT:  Head: Normocephalic and atraumatic.  Eyes: Conjunctivae are normal. Pupils are equal, round, and reactive to light.  Cardiovascular: Normal rate and regular rhythm.   No murmur heard. Pulmonary/Chest: Effort normal and breath sounds normal. No respiratory distress. He has no wheezes. He has no rales.  Abdominal: Soft. Bowel sounds are  normal. He exhibits no distension and no mass. There is no tenderness. There is no rebound and no guarding.  Genitourinary:       Prostate exam is normal.  Heme neg stool.  Musculoskeletal: He exhibits no edema.  Lymphadenopathy:    He has no cervical adenopathy.  Neurological: He is alert and oriented to person, place, and time. He exhibits normal muscle tone.  Skin: Skin is warm and dry. No rash noted. No erythema. No pallor.  Psychiatric: He has a normal mood and affect. His behavior is normal. Judgment and thought content normal.          Assessment & Plan:

## 2012-01-30 NOTE — Assessment & Plan Note (Signed)
Check A1C.  He wishes to start working again on a diabetic diet and weight loss.

## 2012-01-30 NOTE — Assessment & Plan Note (Signed)
Clinically stable.  No recent outbreaks.

## 2012-01-30 NOTE — Assessment & Plan Note (Signed)
Rate stable.  He follows with coumadin clinic.  Management per cardiology.

## 2012-01-30 NOTE — Assessment & Plan Note (Signed)
Clinically stable on plaquenil, robaxin- management per Dr. Estanislado Pandy.

## 2012-01-30 NOTE — Patient Instructions (Addendum)
Please call your insurance to see if they will cover the Shingles shot- zostavax. Check your BP once daily for the next 1 week. Call us with your readings. Complete your blood work prior to leaving. Follow up in 3 months.

## 2012-01-31 ENCOUNTER — Encounter: Payer: Self-pay | Admitting: Family

## 2012-02-01 ENCOUNTER — Encounter: Payer: Self-pay | Admitting: Family

## 2012-02-01 DIAGNOSIS — H52229 Regular astigmatism, unspecified eye: Secondary | ICD-10-CM | POA: Diagnosis not present

## 2012-02-01 DIAGNOSIS — H25019 Cortical age-related cataract, unspecified eye: Secondary | ICD-10-CM | POA: Diagnosis not present

## 2012-02-01 DIAGNOSIS — H524 Presbyopia: Secondary | ICD-10-CM | POA: Diagnosis not present

## 2012-02-01 DIAGNOSIS — Z79899 Other long term (current) drug therapy: Secondary | ICD-10-CM | POA: Diagnosis not present

## 2012-02-02 ENCOUNTER — Other Ambulatory Visit: Payer: Self-pay | Admitting: Family

## 2012-02-13 DIAGNOSIS — Z79899 Other long term (current) drug therapy: Secondary | ICD-10-CM | POA: Diagnosis not present

## 2012-02-13 DIAGNOSIS — R5383 Other fatigue: Secondary | ICD-10-CM | POA: Diagnosis not present

## 2012-02-13 DIAGNOSIS — M2550 Pain in unspecified joint: Secondary | ICD-10-CM | POA: Diagnosis not present

## 2012-02-13 DIAGNOSIS — R6889 Other general symptoms and signs: Secondary | ICD-10-CM | POA: Diagnosis not present

## 2012-02-13 DIAGNOSIS — E559 Vitamin D deficiency, unspecified: Secondary | ICD-10-CM | POA: Diagnosis not present

## 2012-02-14 ENCOUNTER — Telehealth: Payer: Self-pay | Admitting: Family

## 2012-02-14 NOTE — Telephone Encounter (Signed)
Please advise re: valcyclovir refill.

## 2012-02-15 ENCOUNTER — Telehealth: Payer: Self-pay | Admitting: *Deleted

## 2012-02-15 MED ORDER — VALACYCLOVIR HCL 500 MG PO TABS: 500.0000 mg | ORAL_TABLET | Freq: Every day | ORAL | Status: DC

## 2012-02-15 NOTE — Telephone Encounter (Signed)
Pt called with the following BP readings:  7/25     138 /84 7/26 142/83 7/27 136/80 7/28 141/88 7/29 140/84 7/30 150/85  7/31 130/78  Pt confirmed he currently takes clonidine 0.1mg  1 tablet twice daily, diovan 160mg  1 tablet daily, diltiazem 60mg  2 tablets twice a day and furosemide 40mg  1 tablet twice daily.  Please advise.

## 2012-02-15 NOTE — Telephone Encounter (Signed)
Overall BP looks good.  Continue current meds at current doses.

## 2012-02-15 NOTE — Telephone Encounter (Signed)
Rx sent to [facility].

## 2012-02-16 NOTE — Telephone Encounter (Signed)
Notified pt, he states that he thinks he may have shingles. Rash from back around to his chest. Scheduled pt appt tomorrow at 3:15pm.

## 2012-02-17 ENCOUNTER — Encounter: Payer: Self-pay | Admitting: Family

## 2012-02-17 ENCOUNTER — Ambulatory Visit (INDEPENDENT_AMBULATORY_CARE_PROVIDER_SITE_OTHER): Payer: Medicare Other | Admitting: Family

## 2012-02-17 ENCOUNTER — Ambulatory Visit: Payer: Medicare Other | Admitting: Family

## 2012-02-17 VITALS — BP 160/80 | HR 58 | Temp 98.2°F | Resp 16 | Ht 72.0 in | Wt 280.0 lb

## 2012-02-17 DIAGNOSIS — B029 Zoster without complications: Secondary | ICD-10-CM

## 2012-02-17 MED ORDER — VALACYCLOVIR HCL 500 MG PO TABS: 1000.0000 mg | ORAL_TABLET | Freq: Three times a day (TID) | ORAL | Status: DC

## 2012-02-17 MED ORDER — PREGABALIN 75 MG PO CAPS: 75.0000 mg | ORAL_CAPSULE | Freq: Two times a day (BID) | ORAL | Status: DC

## 2012-02-17 NOTE — Patient Instructions (Addendum)
Shingles Shingles is caused by the same virus that causes chickenpox (varicella zoster virus or VZV). Shingles often occurs many years or decades after having chickenpox. That is why it is more common in adults older than 50 years. The virus reactivates and breaks out as an infection in a nerve root. SYMPTOMS   The initial feeling (sensations) may be pain. This pain is usually described as:   Burning.   Stabbing.   Throbbing.   Tingling in the nerve root.   A red rash will follow in a couple days. The rash may occur in any area of the body and is usually on one side (unilateral) of the body in a band or belt-like pattern. The rash usually starts out as very small blisters (vesicles). They will dry up after 7 to 10 days. This is not usually a significant problem except for the pain it causes.   Long-lasting (chronic) pain is more likely in an elderly person. It can last months to years. This condition is called postherpetic neuralgia.  Shingles can be an extremely severe infection in someone with AIDS, a weakened immune system, or with forms of leukemia. It can also be severe if you are taking transplant medicines or other medicines that weaken the immune system. TREATMENT  Your caregiver will often treat you with:  Antiviral drugs.   Anti-inflammatory drugs.   Pain medicines.  Bed rest is very important in preventing the pain associated with herpes zoster (postherpetic neuralgia). Application of heat in the form of a hot water bottle or electric heating pad or gentle pressure with the hand is recommended to help with the pain or discomfort. PREVENTION  A varicella zoster vaccine is available to help protect against the virus. The Food and Drug Administration approved the varicella zoster vaccine for individuals 50 years of age and older. HOME CARE INSTRUCTIONS   Cool compresses to the area of rash may be helpful.   Only take over-the-counter or prescription medicines for pain,  discomfort, or fever as directed by your caregiver.   Avoid contact with:   Babies.   Pregnant women.   Children with eczema.   Elderly people with transplants.   People with chronic illnesses, such as leukemia and AIDS.   If the area involved is on your face, you may receive a referral for follow-up to a specialist. It is very important to keep all follow-up appointments. This will help avoid eye complications, chronic pain, or disability.  SEEK IMMEDIATE MEDICAL CARE IF:   You develop any pain (headache) in the area of the face or eye. This must be followed carefully by your caregiver or ophthalmologist. An infection in part of your eye (cornea) can be very serious. It could lead to blindness.   You do not have pain relief from prescribed medicines.   Your redness or swelling spreads.   The area involved becomes very swollen and painful.   You have a fever.   You notice any red or painful lines extending away from the affected area toward your heart (lymphangitis).   Your condition is worsening or has changed.  Document Released: 07/04/2005 Document Revised: 06/23/2011 Document Reviewed: 06/08/2009 Exit[employer] Patient Information 2012 [employer]. 

## 2012-02-17 NOTE — Progress Notes (Signed)
Subjective:    Patient ID: Bruce Cole, male    DOB: 1940/07/31, 71 y.o.   MRN: [name]  HPI Mr.  Pirillo is a 71 yr old male who presents with chief complaint of rash.  Rash started 6 days ago.  Rash is located on the left side of chest and wraps around to the left back.  Rash is described as painful. Pt reports that he started valtrex within 72 hrs of onset of symptoms and thinks that this helped.   Review of Systems    see HPI  Past Medical History  Diagnosis Date  . Nephrolithiasis   . Arthritis     rheumatoid  . Colon polyp 2000  . Atrial fibrillation   . Unspecified essential hypertension   . Gout     History   Social History  . Marital Status: Single    Spouse Name: N/A    Number of Children: 3  . Years of Education: N/A   Occupational History  .     Social History Main Topics  . Smoking status: Never Smoker   . Smokeless tobacco: Never Used  . Alcohol Use: Yes     occasional  . Drug Use: No  . Sexually Active: Not on file   Other Topics Concern  . 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 Spine surgery     x 2  . Joint replacement     Total L-Hip replacement, Right Knee 10/20/09  . Cholecystectomy 1994    Family History  Problem Relation Age of Onset  . Hypertension Mother   . Arthritis Mother     ?RA  . Hypertension Father     No Known Allergies  Current Outpatient Prescriptions on File Prior to Visit  Medication Sig Dispense Refill  . allopurinol (ZYLOPRIM) 100 MG tablet Take 2 tablets (200 mg total) by mouth daily.  180 tablet  0  . cloNIDine (CATAPRES) 0.1 MG tablet Take 1 tablet (0.1 mg total) by mouth 2 (two) times daily.  60 tablet  5  . colchicine (COLCRYS) 0.6 MG tablet Take 2 tablets by mouth AT START OF GOUT FLARE AND 1 TABLET 3 HOURS LATER. MAX 3 TABLETS PER GOUT ATTACK.  20 tablet  3  . diltiazem (CARDIZEM) 60 MG tablet Take 2  tablets twice a day  120 tablet  3  . DIOVAN 160 MG tablet TAKE ONE TABLET BY MOUTH EVERY DAY  30 each  3  . furosemide (LASIX) 40 MG tablet Take 1 tablet (40 mg total) by mouth 2 (two) times daily.  60 tablet  3  . hydroxychloroquine (PLAQUENIL) 200 MG tablet Take 1 tablet by mouth Twice daily.      . methocarbamol (ROBAXIN) 500 MG tablet Take 500 mg by mouth 4 (four) times daily as needed.        . sildenafil (VIAGRA) 100 MG tablet Take 100 mg by mouth daily as needed.        . traMADol (ULTRAM) 50 MG tablet Take 50 mg by mouth Twice daily.      . valACYclovir (VALTREX) 500 MG tablet Take 1 tablet (500 mg total) by mouth daily.  30 tablet  5  . warfarin (COUMADIN) 7.5 MG tablet Take 1 tablet (7.5 mg total) by mouth as directed.  40 tablet  3  . pregabalin (LYRICA) 75 MG capsule Take 1  capsule (75 mg total) by mouth 2 (two) times daily.  28 capsule  0    BP 160/80  Pulse 58  Temp 98.2 F (36.8 C) (Oral)  Resp 16  Ht 6' (1.829 m)  Wt 280 lb (127.007 kg)  BMI 37.97 kg/m2  SpO2 96%    Objective:   Physical Exam  Constitutional: He appears well-developed and well-nourished. No distress.  Cardiovascular: Normal rate and regular rhythm.   No murmur heard. Pulmonary/Chest: Effort normal and breath sounds normal. No respiratory distress. He has no wheezes. He has no rales. He exhibits no tenderness.  Skin:       Raised erythematous linear rash noted on left anterior chest. Rash wraps around to back.   Psychiatric: He has a normal mood and affect. His behavior is normal. Judgment and thought content normal.          Assessment & Plan:

## 2012-02-19 DIAGNOSIS — B029 Zoster without complications: Secondary | ICD-10-CM | POA: Insufficient documentation

## 2012-02-19 NOTE — Assessment & Plan Note (Signed)
New.  Plan to continue increased dose of acyclovir x 7 days.  Then pt is to return to his once daily dosing schedule.  Add lyrica PRN pain. We discussed that this can make him drowsy and not to drive after taking this medication.  Pt verbalizes understanding.

## 2012-02-21 ENCOUNTER — Telehealth: Payer: Self-pay | Admitting: *Deleted

## 2012-02-21 NOTE — Telephone Encounter (Signed)
Received message from pt stating his orthopedic doctor notified him that his LFTs were elevated and wants him to return to them for additional testing. Pt wants to know if he should proceed?  Please advise.

## 2012-02-21 NOTE — Telephone Encounter (Signed)
Could we please request copy of his blood work from ortho?  I will review and let him know what I think.

## 2012-02-22 DIAGNOSIS — M25559 Pain in unspecified hip: Secondary | ICD-10-CM | POA: Diagnosis not present

## 2012-02-22 DIAGNOSIS — M25539 Pain in unspecified wrist: Secondary | ICD-10-CM | POA: Diagnosis not present

## 2012-02-22 DIAGNOSIS — M109 Gout, unspecified: Secondary | ICD-10-CM | POA: Diagnosis not present

## 2012-02-22 NOTE — Telephone Encounter (Signed)
Left detailed message for pt to return my call and verify the name of the orthopedic physician. Our records indicate pt has seen [facility] but need verification of who to contact for results.

## 2012-02-23 NOTE — Telephone Encounter (Signed)
Requested recent labs from [facility] at [facility].

## 2012-02-24 NOTE — Telephone Encounter (Signed)
Results received and forwarded to Provider for review.

## 2012-02-29 NOTE — Telephone Encounter (Signed)
Nothing further needed per Provider.

## 2012-03-02 ENCOUNTER — Other Ambulatory Visit: Payer: Self-pay | Admitting: *Deleted

## 2012-03-02 NOTE — Telephone Encounter (Signed)
Received fax from [facility] requesting refill of pt's hydroxychloroquine 200mg  bid. I do not see that we have filled this for pt in the past. Notified Sam at [facility] to contact pt's rheumatologist.

## 2012-03-05 ENCOUNTER — Ambulatory Visit (INDEPENDENT_AMBULATORY_CARE_PROVIDER_SITE_OTHER): Payer: Medicare Other

## 2012-03-05 DIAGNOSIS — Z7901 Long term (current) use of anticoagulants: Secondary | ICD-10-CM

## 2012-03-05 DIAGNOSIS — I4891 Unspecified atrial fibrillation: Secondary | ICD-10-CM

## 2012-03-05 LAB — POCT INR: INR: 2.4

## 2012-03-15 ENCOUNTER — Other Ambulatory Visit: Payer: Self-pay | Admitting: Family

## 2012-03-16 ENCOUNTER — Other Ambulatory Visit: Payer: Self-pay | Admitting: Family

## 2012-04-09 ENCOUNTER — Other Ambulatory Visit: Payer: Self-pay | Admitting: *Deleted

## 2012-04-09 MED ORDER — WARFARIN SODIUM 7.5 MG PO TABS: 7.5000 mg | ORAL_TABLET | ORAL | Status: DC

## 2012-04-10 ENCOUNTER — Other Ambulatory Visit: Payer: Self-pay | Admitting: *Deleted

## 2012-04-10 MED ORDER — VALSARTAN 160 MG PO TABS: 160.0000 mg | ORAL_TABLET | Freq: Every day | ORAL | Status: DC

## 2012-04-10 NOTE — Telephone Encounter (Signed)
Received message from [facility] requesting refill on pt's diovan. Refill sent via eRx #30 x 2 refills.

## 2012-04-13 ENCOUNTER — Other Ambulatory Visit: Payer: Self-pay | Admitting: Family

## 2012-04-13 ENCOUNTER — Other Ambulatory Visit: Payer: Self-pay

## 2012-04-13 MED ORDER — WARFARIN SODIUM 7.5 MG PO TABS: 7.5000 mg | ORAL_TABLET | ORAL | Status: DC

## 2012-04-16 ENCOUNTER — Ambulatory Visit (INDEPENDENT_AMBULATORY_CARE_PROVIDER_SITE_OTHER): Payer: Medicare Other | Admitting: *Deleted

## 2012-04-16 ENCOUNTER — Other Ambulatory Visit: Payer: Self-pay | Admitting: Family

## 2012-04-16 DIAGNOSIS — I4891 Unspecified atrial fibrillation: Secondary | ICD-10-CM

## 2012-04-16 DIAGNOSIS — Z7901 Long term (current) use of anticoagulants: Secondary | ICD-10-CM

## 2012-04-16 LAB — POCT INR: INR: 3

## 2012-04-17 NOTE — Telephone Encounter (Signed)
Done/SLS 

## 2012-04-30 ENCOUNTER — Ambulatory Visit: Payer: Medicare Other | Admitting: Family

## 2012-05-07 ENCOUNTER — Encounter: Payer: Self-pay | Admitting: Family

## 2012-05-07 ENCOUNTER — Ambulatory Visit (INDEPENDENT_AMBULATORY_CARE_PROVIDER_SITE_OTHER): Payer: Medicare Other | Admitting: Family

## 2012-05-07 VITALS — BP 160/80 | HR 53 | Temp 97.7°F | Resp 16 | Ht 72.0 in | Wt 289.0 lb

## 2012-05-07 DIAGNOSIS — Z23 Encounter for immunization: Secondary | ICD-10-CM | POA: Diagnosis not present

## 2012-05-07 DIAGNOSIS — B029 Zoster without complications: Secondary | ICD-10-CM | POA: Diagnosis not present

## 2012-05-07 DIAGNOSIS — I10 Essential (primary) hypertension: Secondary | ICD-10-CM

## 2012-05-07 DIAGNOSIS — R7309 Other abnormal glucose: Secondary | ICD-10-CM

## 2012-05-07 NOTE — Progress Notes (Signed)
Subjective:    Patient ID: Bruce Cole, male    DOB: [birth year], 71 y.o.   MRN: [name]  HPI  Bruce Cole is a 71 yr old male who presents today for follow up.  Shingles- reports that lesions resolved, but he continues to have intermittent "twinges".   HTN- Reports + compliance with his blood pressure medication.     Review of Systems See HPI  Past Medical History  Diagnosis Date  . Nephrolithiasis   . Arthritis     rheumatoid  . Colon polyp 2000  . Atrial fibrillation   . Unspecified essential hypertension   . Gout     History   Social History  . Marital Status: Single    Spouse Name: N/A    Number of Children: 3  . Years of Education: N/A   Occupational History  .     Social History Main Topics  . Smoking status: Never Smoker   . Smokeless tobacco: Never Used  . Alcohol Use: Yes     occasional  . Drug Use: No  . Sexually Active: Not on file   Other Topics Concern  . 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 Spine surgery     x 2  . Joint replacement     Total L-Hip replacement, Right Knee 10/20/09  . Cholecystectomy 1994    Family History  Problem Relation Age of Onset  . Hypertension Mother   . Arthritis Mother     ?RA  . Hypertension Father     No Known Allergies  Current Outpatient Prescriptions on File Prior to Visit  Medication Sig Dispense Refill  . allopurinol (ZYLOPRIM) 100 MG tablet TAKE TWO TABLETS BY MOUTH DAILY.  180 tablet  0  . cloNIDine (CATAPRES) 0.1 MG tablet TAKE 1 TABLET (0.1 MG TOTAL) BY MOUTH TWO   (TWO) TIMES DAILY.  60 tablet  0  . colchicine (COLCRYS) 0.6 MG tablet Take 2 tablets by mouth AT START OF GOUT FLARE AND 1 TABLET 3 HOURS LATER. MAX 3 TABLETS PER GOUT ATTACK.  20 tablet  3  . diltiazem (CARDIZEM) 60 MG tablet TAKE TWO TABLETS BY MOUTH TWICE DAILY  120 tablet  0  . furosemide (LASIX) 40 MG tablet TAKE ONE TABLET  BY MOUTH TWICE DAILY  60 tablet  2  . hydroxychloroquine (PLAQUENIL) 200 MG tablet Take 1 tablet by mouth Twice daily.      . methocarbamol (ROBAXIN) 500 MG tablet Take 500 mg by mouth 4 (four) times daily as needed.        . sildenafil (VIAGRA) 100 MG tablet Take 100 mg by mouth daily as needed.        . traMADol (ULTRAM) 50 MG tablet Take 50 mg by mouth Twice daily.      . valsartan (DIOVAN) 160 MG tablet Take 1 tablet (160 mg total) by mouth daily.  30 tablet  2  . warfarin (COUMADIN) 7.5 MG tablet Take 1 tablet (7.5 mg total) by mouth as directed.  40 tablet  3    BP 160/80  Pulse 53  Temp 97.7 F (36.5 C) (Oral)  Resp 16  Ht 6' (1.829 m)  Wt 289 lb (131.09 kg)  BMI 39.20 kg/m2  SpO2 96%       Objective:   Physical Exam  Constitutional: He appears well-developed and well-nourished. No distress.  Cardiovascular: Normal rate and regular rhythm.   No murmur heard. Pulmonary/Chest: Effort normal and breath sounds normal. No respiratory distress. He has no wheezes. He has no rales. He exhibits no tenderness.  Musculoskeletal: He exhibits no edema.  Skin: Skin is warm and dry.  Psychiatric: He has a normal mood and affect. His behavior is normal. Judgment and thought content normal.          Assessment & Plan:

## 2012-05-07 NOTE — Assessment & Plan Note (Signed)
Clinically resolved. He is only having some mild "twinges" of discomfort at this point which I told him is common.  Monitor.

## 2012-05-07 NOTE — Patient Instructions (Signed)
Please follow up for blood work in 1 month.

## 2012-05-07 NOTE — Assessment & Plan Note (Signed)
BP is up today.  Repeat today in office is 154/84.  He has a BP cuff at home and reports "white coat" htn.  I have asked him to check daily x 1 week and contact us with readings.

## 2012-05-07 NOTE — Assessment & Plan Note (Signed)
Recommended follow up A1C today- he wishes to defer for 1 month.  Will have him return to lab for bmet and a1C in 1 month.

## 2012-05-11 ENCOUNTER — Other Ambulatory Visit: Payer: Self-pay | Admitting: Family

## 2012-05-11 NOTE — Telephone Encounter (Signed)
Medication refill request: clonidine 0.1 mg tablet, po 2 times a day. #60 0 rf. Appt scheduled 08/07/12. Please advise.

## 2012-05-14 ENCOUNTER — Telehealth: Payer: Self-pay | Admitting: *Deleted

## 2012-05-14 DIAGNOSIS — R7309 Other abnormal glucose: Secondary | ICD-10-CM

## 2012-05-14 NOTE — Telephone Encounter (Signed)
Message copied by Ronny Flurry on Mon May 14, 2012  4:38 PM ------      Message from: O'SULLIVAN, [name]      Created: Mon May 07, 2012  2:44 PM       Pt will follow up in 1 month for bmet and a1c- dx hyperglycemia

## 2012-05-14 NOTE — Telephone Encounter (Signed)
Future orders entered and copy given to the lab.

## 2012-05-15 ENCOUNTER — Telehealth: Payer: Self-pay | Admitting: Family

## 2012-05-15 NOTE — Telephone Encounter (Signed)
Notified pt. 

## 2012-05-15 NOTE — Telephone Encounter (Signed)
Pls let pt know that I have reviewed his blood pressure readings and I am happy with them. He should continue current blood pressure medications- no changes at this time.

## 2012-05-28 ENCOUNTER — Ambulatory Visit (INDEPENDENT_AMBULATORY_CARE_PROVIDER_SITE_OTHER): Payer: Medicare Other | Admitting: *Deleted

## 2012-05-28 DIAGNOSIS — Z7901 Long term (current) use of anticoagulants: Secondary | ICD-10-CM

## 2012-05-28 DIAGNOSIS — I4891 Unspecified atrial fibrillation: Secondary | ICD-10-CM

## 2012-05-28 LAB — POCT INR: INR: 2.4

## 2012-06-04 ENCOUNTER — Other Ambulatory Visit: Payer: Self-pay | Admitting: Family

## 2012-06-06 ENCOUNTER — Other Ambulatory Visit: Payer: Self-pay | Admitting: Family

## 2012-06-06 NOTE — Telephone Encounter (Signed)
Rx to pharmacy/SLS 

## 2012-07-02 ENCOUNTER — Ambulatory Visit (INDEPENDENT_AMBULATORY_CARE_PROVIDER_SITE_OTHER): Payer: Medicare Other | Admitting: Pharmacist

## 2012-07-02 ENCOUNTER — Telehealth: Payer: Self-pay | Admitting: Family

## 2012-07-02 DIAGNOSIS — I4891 Unspecified atrial fibrillation: Secondary | ICD-10-CM | POA: Diagnosis not present

## 2012-07-02 DIAGNOSIS — Z7901 Long term (current) use of anticoagulants: Secondary | ICD-10-CM | POA: Diagnosis not present

## 2012-07-02 MED ORDER — COLCHICINE 0.6 MG PO TABS: ORAL_TABLET | ORAL | Status: DC

## 2012-07-02 NOTE — Telephone Encounter (Signed)
Refill sent.

## 2012-07-02 NOTE — Telephone Encounter (Signed)
Refill- colcrys 0.6mg  tablets. Take two tablets at start of gout flare and 1 tablet 3 hours laster. Maximum daily dose is 3 tablets. Qty 20 last fill 8.22.13

## 2012-07-19 ENCOUNTER — Telehealth: Payer: Self-pay | Admitting: Family

## 2012-07-19 MED ORDER — FUROSEMIDE 40 MG PO TABS: 40.0000 mg | ORAL_TABLET | Freq: Two times a day (BID) | ORAL | Status: DC

## 2012-07-19 NOTE — Telephone Encounter (Signed)
Patient called in requesting a refill of furosemide to be sent to [facility] on [address]

## 2012-07-19 NOTE — Telephone Encounter (Signed)
Refills sent

## 2012-07-20 ENCOUNTER — Telehealth: Payer: Self-pay | Admitting: Family

## 2012-07-20 NOTE — Telephone Encounter (Signed)
Refill-diovan 160mg  tablets. Take one tablet every day. Qty 30 last fill 8.22.13

## 2012-07-23 MED ORDER — VALSARTAN 160 MG PO TABS: 160.0000 mg | ORAL_TABLET | Freq: Every day | ORAL | Status: DC

## 2012-07-23 NOTE — Telephone Encounter (Signed)
Refill sent to pharmacy.   

## 2012-07-24 ENCOUNTER — Telehealth: Payer: Self-pay | Admitting: Family

## 2012-07-24 MED ORDER — ALLOPURINOL 100 MG PO TABS: ORAL_TABLET | ORAL | Status: DC

## 2012-07-24 NOTE — Telephone Encounter (Signed)
Refill-allopurinol 100mg  tablets. Take 2 tablets by mouth daily. Qty 60 last fill 1.7.14

## 2012-07-24 NOTE — Telephone Encounter (Signed)
Refill sent to pharmacy.   

## 2012-08-07 ENCOUNTER — Ambulatory Visit (INDEPENDENT_AMBULATORY_CARE_PROVIDER_SITE_OTHER): Payer: Medicare Other | Admitting: Family

## 2012-08-07 ENCOUNTER — Encounter: Payer: Self-pay | Admitting: Family

## 2012-08-07 VITALS — BP 170/90 | HR 58 | Temp 98.2°F | Resp 16 | Ht 72.0 in | Wt 299.1 lb

## 2012-08-07 DIAGNOSIS — I4891 Unspecified atrial fibrillation: Secondary | ICD-10-CM | POA: Diagnosis not present

## 2012-08-07 DIAGNOSIS — R739 Hyperglycemia, unspecified: Secondary | ICD-10-CM

## 2012-08-07 DIAGNOSIS — I10 Essential (primary) hypertension: Secondary | ICD-10-CM

## 2012-08-07 DIAGNOSIS — Z09 Encounter for follow-up examination after completed treatment for conditions other than malignant neoplasm: Secondary | ICD-10-CM | POA: Diagnosis not present

## 2012-08-07 DIAGNOSIS — M069 Rheumatoid arthritis, unspecified: Secondary | ICD-10-CM | POA: Diagnosis not present

## 2012-08-07 DIAGNOSIS — M064 Inflammatory polyarthropathy: Secondary | ICD-10-CM | POA: Diagnosis not present

## 2012-08-07 DIAGNOSIS — R7309 Other abnormal glucose: Secondary | ICD-10-CM

## 2012-08-07 DIAGNOSIS — M19049 Primary osteoarthritis, unspecified hand: Secondary | ICD-10-CM | POA: Diagnosis not present

## 2012-08-07 DIAGNOSIS — M109 Gout, unspecified: Secondary | ICD-10-CM | POA: Diagnosis not present

## 2012-08-07 LAB — BASIC METABOLIC PANEL
BUN: 18 mg/dL (ref 6–23)
Calcium: 9.6 mg/dL (ref 8.4–10.5)
Glucose, Bld: 104 mg/dL — ABNORMAL HIGH (ref 70–99)
Sodium: 141 mEq/L (ref 135–145)

## 2012-08-07 MED ORDER — VALSARTAN 320 MG PO TABS: 320.0000 mg | ORAL_TABLET | Freq: Every day | ORAL | Status: DC

## 2012-08-07 NOTE — Progress Notes (Signed)
Subjective:    Patient ID: Bruce Cole, male    DOB: 1940/09/10, 72 y.o.   MRN: [name]  HPI  Mr.  Wigmore is a 72 yr old male who presents today for follow up.  1) HTN- Currently maintained on clonidine, diltiazem, furosemide.   2) Hyperglycemia- last A1C was 6.3.  Pt is due for follow up A1C.  3) RA-  Continues to follow with Rheumatology.  He is maintained on plaquenil.  4) AF- pt continues coumadin.  This is managed by coumadin clinic.  Review of Systems See HPI  Past Medical History  Diagnosis Date  . Nephrolithiasis   . Arthritis     rheumatoid  . Colon polyp 2000  . Atrial fibrillation   . Unspecified essential hypertension   . Gout     History   Social History  . Marital Status: Single    Spouse Name: N/A    Number of Children: 3  . Years of Education: N/A   Occupational History  .     Social History Main Topics  . Smoking status: Never Smoker   . Smokeless tobacco: Never Used  . Alcohol Use: Yes     Comment: occasional  . Drug Use: No  . Sexually Active: Not on file   Other Topics Concern  . 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 Spine surgery     x 2  . Joint replacement     Total L-Hip replacement, Right Knee 10/20/09  . Cholecystectomy 1994    Family History  Problem Relation Age of Onset  . Hypertension Mother   . Arthritis Mother     ?RA  . Hypertension Father     No Known Allergies  Current Outpatient Prescriptions on File Prior to Visit  Medication Sig Dispense Refill  . allopurinol (ZYLOPRIM) 100 MG tablet Take 2 tablets by mouth daily.  60 tablet  2  . cloNIDine (CATAPRES) 0.1 MG tablet TAKE 1 TABLET (0.1 MG TOTAL) BY MOUTH TWO   (TWO) TIMES DAILY.  60 tablet  3  . colchicine (COLCRYS) 0.6 MG tablet Take 2 tablets by mouth AT START OF GOUT FLARE AND 1 TABLET 3 HOURS LATER. MAX 3 TABLETS PER GOUT ATTACK.  20 tablet  3  .  diltiazem (CARDIZEM) 60 MG tablet TAKE TWO TABLETS BY MOUTH TWICE DAILY  120 tablet  2  . furosemide (LASIX) 40 MG tablet Take 1 tablet (40 mg total) by mouth 2 (two) times daily.  60 tablet  2  . hydroxychloroquine (PLAQUENIL) 200 MG tablet Take 1 tablet by mouth Twice daily.      . methocarbamol (ROBAXIN) 500 MG tablet Take 500 mg by mouth 4 (four) times daily as needed.        . sildenafil (VIAGRA) 100 MG tablet Take 100 mg by mouth daily as needed.        . traMADol (ULTRAM) 50 MG tablet Take 50 mg by mouth Twice daily.      . valACYclovir (VALTREX) 500 MG tablet Take 500 mg by mouth 2 (two) times daily. Take for 3 days at start of herpes flare      . warfarin (COUMADIN) 7.5 MG tablet Take 1 tablet (7.5 mg total) by mouth as directed.  40 tablet  3  . valsartan (DIOVAN) 320 MG tablet Take 1 tablet (320 mg total) by mouth daily.  30 tablet  0    BP 170/90  Pulse 58  Temp 98.2 F (36.8 C) (Oral)  Resp 16  Ht 6' (1.829 m)  Wt 299 lb 1.3 oz (135.662 kg)  BMI 40.56 kg/m2  SpO2 98%       Objective:   Physical Exam  Constitutional: He appears well-developed and well-nourished. No distress.  HENT:  Head: Normocephalic and atraumatic.  Mouth/Throat: No oropharyngeal exudate.  Cardiovascular: Normal rate and regular rhythm.   No murmur heard. Pulmonary/Chest: Effort normal and breath sounds normal. No respiratory distress. He has no wheezes. He has no rales. He exhibits no tenderness.  Musculoskeletal: He exhibits no edema.  Lymphadenopathy:    He has no cervical adenopathy.  Psychiatric: He has a normal mood and affect. His behavior is normal. Judgment and thought content normal.          Assessment & Plan:

## 2012-08-07 NOTE — Assessment & Plan Note (Signed)
Rate low today non diltiazem, asymptomatic.  Sounds on exam to be in NSR today. Continues coumadin which is managed by coumadin clinic.

## 2012-08-07 NOTE — Assessment & Plan Note (Signed)
Obtain A1C. 

## 2012-08-07 NOTE — Assessment & Plan Note (Signed)
At baseline, follows with rheumatology.

## 2012-08-07 NOTE — Assessment & Plan Note (Signed)
BP Readings from Last 3 Encounters:  08/07/12 170/90  05/07/12 160/80  02/17/12 160/80  BP is up today.  Will obtain bmet.  Increase diovan from 160mg  to 320 mg.  Follow up in 2 weeks for Bp recheck and repeat bmet.

## 2012-08-07 NOTE — Patient Instructions (Addendum)
Please follow up in 2 weeks

## 2012-08-08 ENCOUNTER — Encounter: Payer: Self-pay | Admitting: Family

## 2012-08-08 ENCOUNTER — Telehealth: Payer: Self-pay | Admitting: Family

## 2012-08-08 NOTE — Telephone Encounter (Signed)
Patient states that he would like [place] to send last lab results to Dr. Estanislado Pandy. Phone# [phone]  Also, patient states that [name] told him that she would call in a medication regarding his sinus issues to the pharmacy. He said that the pharmacy has not received this as of yet.

## 2012-08-09 MED ORDER — FLUTICASONE PROPIONATE 50 MCG/ACT NA SUSP: 2.0000 | Freq: Every day | NASAL | Status: DC

## 2012-08-09 NOTE — Telephone Encounter (Signed)
Left detailed message on home# and to call if any questions. 

## 2012-08-09 NOTE — Telephone Encounter (Signed)
OK to fax labs.  Rx sent for flonase.

## 2012-08-13 ENCOUNTER — Ambulatory Visit (INDEPENDENT_AMBULATORY_CARE_PROVIDER_SITE_OTHER): Payer: Medicare Other | Admitting: *Deleted

## 2012-08-13 DIAGNOSIS — I4891 Unspecified atrial fibrillation: Secondary | ICD-10-CM

## 2012-08-13 DIAGNOSIS — Z7901 Long term (current) use of anticoagulants: Secondary | ICD-10-CM

## 2012-08-13 LAB — POCT INR: INR: 2.4

## 2012-08-21 ENCOUNTER — Encounter: Payer: Self-pay | Admitting: Family

## 2012-08-21 ENCOUNTER — Ambulatory Visit (INDEPENDENT_AMBULATORY_CARE_PROVIDER_SITE_OTHER): Payer: Medicare Other | Admitting: Family

## 2012-08-21 VITALS — BP 154/90 | HR 51 | Temp 98.1°F | Resp 16 | Ht 72.0 in | Wt 296.0 lb

## 2012-08-21 DIAGNOSIS — I10 Essential (primary) hypertension: Secondary | ICD-10-CM

## 2012-08-21 DIAGNOSIS — M069 Rheumatoid arthritis, unspecified: Secondary | ICD-10-CM

## 2012-08-21 LAB — BASIC METABOLIC PANEL
Calcium: 9.5 mg/dL (ref 8.4–10.5)
Chloride: 103 mEq/L (ref 96–112)
Creat: 1.08 mg/dL (ref 0.50–1.35)

## 2012-08-21 NOTE — Assessment & Plan Note (Addendum)
Reports BP at home running 130/70. Always higher in the office.  Obtain follow up bmet, continue current meds.  Pt instructed to continue to monitor BP at home and let us know if his home readings start to rise.

## 2012-08-21 NOTE — Patient Instructions (Addendum)
Please follow up in 3 months. Schedule follow up with Dr. Allegra Grana.

## 2012-08-21 NOTE — Assessment & Plan Note (Signed)
Deteriorated.  Recommended that he arrange follow up with Dr. Estanislado Pandy- may need round of prednisone. Defer to rheumatology.

## 2012-08-21 NOTE — Progress Notes (Signed)
Subjective:    Patient ID: Bruce Cole, male    DOB: [birth year], 72 y.o.   MRN: [name]  HPI  Mr.  Bruce Cole is a 72 yr old male who presents today for follow up of his htn.  HTN-  Last visit his diovan was increased from 160mg  to 320mg .  He continues cardizem, clonidine, furosemide.    RA- he continues to follow with rheumatology. He is currently maintained on Plaquenil.  Reports recent increase in his joint swelling.     Review of Systems See HPI  Past Medical History  Diagnosis Date  . Nephrolithiasis   . Arthritis     rheumatoid  . Colon polyp 2000  . Atrial fibrillation   . Unspecified essential hypertension   . Gout     History   Social History  . Marital Status: Single    Spouse Name: N/A    Number of Children: 3  . Years of Education: N/A   Occupational History  .     Social History Main Topics  . Smoking status: Never Smoker   . Smokeless tobacco: Never Used  . Alcohol Use: Yes     Comment: occasional  . Drug Use: No  . Sexually Active: Not on file   Other Topics Concern  . 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 Spine surgery     x 2  . Joint replacement     Total L-Hip replacement, Right Knee 10/20/09  . Cholecystectomy 1994    Family History  Problem Relation Age of Onset  . Hypertension Mother   . Arthritis Mother     ?RA  . Hypertension Father     No Known Allergies  Current Outpatient Prescriptions on File Prior to Visit  Medication Sig Dispense Refill  . allopurinol (ZYLOPRIM) 100 MG tablet Take 2 tablets by mouth daily.  60 tablet  2  . cloNIDine (CATAPRES) 0.1 MG tablet TAKE 1 TABLET (0.1 MG TOTAL) BY MOUTH TWO   (TWO) TIMES DAILY.  60 tablet  3  . colchicine (COLCRYS) 0.6 MG tablet Take 2 tablets by mouth AT START OF GOUT FLARE AND 1 TABLET 3 HOURS LATER. MAX 3 TABLETS PER GOUT ATTACK.  20 tablet  3  . diltiazem (CARDIZEM) 60 MG  tablet TAKE TWO TABLETS BY MOUTH TWICE DAILY  120 tablet  2  . fluticasone (FLONASE) 50 MCG/ACT nasal spray Place 2 sprays into the nose daily.  16 g  6  . furosemide (LASIX) 40 MG tablet Take 1 tablet (40 mg total) by mouth 2 (two) times daily.  60 tablet  2  . hydroxychloroquine (PLAQUENIL) 200 MG tablet Take 1 tablet by mouth Twice daily.      . methocarbamol (ROBAXIN) 500 MG tablet Take 500 mg by mouth 4 (four) times daily as needed.        . sildenafil (VIAGRA) 100 MG tablet Take 100 mg by mouth daily as needed.        . traMADol (ULTRAM) 50 MG tablet Take 50 mg by mouth Twice daily.      . valACYclovir (VALTREX) 500 MG tablet Take 500 mg by mouth 2 (two) times daily. Take for 3 days at start of herpes flare      . valsartan (DIOVAN) 320 MG tablet Take 1 tablet (320 mg total) by mouth daily.  30 tablet  0  .  warfarin (COUMADIN) 7.5 MG tablet Take 1 tablet (7.5 mg total) by mouth as directed.  40 tablet  3    BP 154/90  Pulse 51  Temp 98.1 F (36.7 C) (Oral)  Resp 16  Ht 6' (1.829 m)  Wt 296 lb 0.6 oz (134.283 kg)  BMI 40.15 kg/m2  SpO2 96%       Objective:   Physical Exam  Constitutional: He appears well-developed and well-nourished. No distress.  Musculoskeletal:       No LE edema + swelling noted of left wrist.    Psychiatric: He has a normal mood and affect. His behavior is normal. Judgment and thought content normal.          Assessment & Plan:

## 2012-08-22 ENCOUNTER — Encounter: Payer: Self-pay | Admitting: Family

## 2012-08-24 DIAGNOSIS — Z79899 Other long term (current) drug therapy: Secondary | ICD-10-CM | POA: Diagnosis not present

## 2012-08-24 DIAGNOSIS — M25539 Pain in unspecified wrist: Secondary | ICD-10-CM | POA: Diagnosis not present

## 2012-08-24 DIAGNOSIS — M069 Rheumatoid arthritis, unspecified: Secondary | ICD-10-CM | POA: Diagnosis not present

## 2012-08-24 DIAGNOSIS — M2550 Pain in unspecified joint: Secondary | ICD-10-CM | POA: Diagnosis not present

## 2012-08-24 DIAGNOSIS — Z09 Encounter for follow-up examination after completed treatment for conditions other than malignant neoplasm: Secondary | ICD-10-CM | POA: Diagnosis not present

## 2012-08-24 DIAGNOSIS — M109 Gout, unspecified: Secondary | ICD-10-CM | POA: Diagnosis not present

## 2012-09-04 ENCOUNTER — Other Ambulatory Visit: Payer: Self-pay | Admitting: Family

## 2012-09-04 ENCOUNTER — Telehealth: Payer: Self-pay | Admitting: Family

## 2012-09-04 NOTE — Telephone Encounter (Signed)
Refill- diltiazem 60mg  tablets. Take two tablets by mouth twice a day. Qty 120 last fill 1.21.14

## 2012-09-05 MED ORDER — DILTIAZEM HCL 60 MG PO TABS: 120.0000 mg | ORAL_TABLET | Freq: Two times a day (BID) | ORAL | Status: DC

## 2012-09-05 NOTE — Telephone Encounter (Signed)
Refill sent.

## 2012-09-11 ENCOUNTER — Telehealth: Payer: Self-pay | Admitting: Family

## 2012-09-11 MED ORDER — CLONIDINE HCL 0.1 MG PO TABS: ORAL_TABLET | ORAL | Status: DC

## 2012-09-11 NOTE — Telephone Encounter (Signed)
Refill sent, #30 x 3 refills.

## 2012-09-11 NOTE — Telephone Encounter (Signed)
Refill- clonidine 0.1mg  tablets. Take one tablet by mouth twice daily. Qty 60 last fill 1.29.14

## 2012-09-21 ENCOUNTER — Emergency Department (HOSPITAL_BASED_OUTPATIENT_CLINIC_OR_DEPARTMENT_OTHER): Payer: Medicare Other

## 2012-09-21 ENCOUNTER — Encounter (HOSPITAL_BASED_OUTPATIENT_CLINIC_OR_DEPARTMENT_OTHER): Payer: Self-pay | Admitting: Emergency Medicine

## 2012-09-21 ENCOUNTER — Emergency Department (HOSPITAL_BASED_OUTPATIENT_CLINIC_OR_DEPARTMENT_OTHER)
Admission: EM | Admit: 2012-09-21 | Discharge: 2012-09-21 | Disposition: A | Discharge status: Home / Self Care | Payer: Medicare Other | Attending: Emergency Medicine | Admitting: Emergency Medicine

## 2012-09-21 DIAGNOSIS — R791 Abnormal coagulation profile: Secondary | ICD-10-CM | POA: Insufficient documentation

## 2012-09-21 DIAGNOSIS — IMO0002 Reserved for concepts with insufficient information to code with codable children: Secondary | ICD-10-CM | POA: Insufficient documentation

## 2012-09-21 DIAGNOSIS — R0989 Other specified symptoms and signs involving the circulatory and respiratory systems: Secondary | ICD-10-CM | POA: Diagnosis not present

## 2012-09-21 DIAGNOSIS — Z87442 Personal history of urinary calculi: Secondary | ICD-10-CM | POA: Insufficient documentation

## 2012-09-21 DIAGNOSIS — Z79899 Other long term (current) drug therapy: Secondary | ICD-10-CM | POA: Insufficient documentation

## 2012-09-21 DIAGNOSIS — Z860100 Personal history of colon polyps, unspecified: Secondary | ICD-10-CM | POA: Insufficient documentation

## 2012-09-21 DIAGNOSIS — Z8739 Personal history of other diseases of the musculoskeletal system and connective tissue: Secondary | ICD-10-CM | POA: Diagnosis not present

## 2012-09-21 DIAGNOSIS — J189 Pneumonia, unspecified organism: Secondary | ICD-10-CM | POA: Diagnosis not present

## 2012-09-21 DIAGNOSIS — I10 Essential (primary) hypertension: Secondary | ICD-10-CM | POA: Diagnosis not present

## 2012-09-21 DIAGNOSIS — Z8679 Personal history of other diseases of the circulatory system: Secondary | ICD-10-CM | POA: Insufficient documentation

## 2012-09-21 DIAGNOSIS — Z7901 Long term (current) use of anticoagulants: Secondary | ICD-10-CM | POA: Diagnosis not present

## 2012-09-21 DIAGNOSIS — R269 Unspecified abnormalities of gait and mobility: Secondary | ICD-10-CM | POA: Insufficient documentation

## 2012-09-21 DIAGNOSIS — R079 Chest pain, unspecified: Secondary | ICD-10-CM | POA: Diagnosis not present

## 2012-09-21 LAB — COMPREHENSIVE METABOLIC PANEL
ALT: 22 U/L (ref 0–53)
AST: 34 U/L (ref 0–37)
Alkaline Phosphatase: 131 U/L — ABNORMAL HIGH (ref 39–117)
CO2: 29 mEq/L (ref 19–32)
Calcium: 9.7 mg/dL (ref 8.4–10.5)
Chloride: 98 mEq/L (ref 96–112)
GFR calc Af Amer: 69 mL/min — ABNORMAL LOW (ref >90)
GFR calc non Af Amer: 60 mL/min — ABNORMAL LOW (ref >90)
Glucose, Bld: 118 mg/dL — ABNORMAL HIGH (ref 70–99)
Potassium: 3.6 mEq/L (ref 3.5–5.1)
Sodium: 139 mEq/L (ref 135–145)

## 2012-09-21 LAB — CBC WITH DIFFERENTIAL/PLATELET
Basophils Relative: 0 % (ref 0–1)
Eosinophils Absolute: 0.1 10*3/uL (ref 0.0–0.7)
HCT: 40.8 % (ref 39.0–52.0)
Hemoglobin: 13.8 g/dL (ref 13.0–17.0)
Lymphs Abs: 1.8 10*3/uL (ref 0.7–4.0)
MCH: 31.4 pg (ref 26.0–34.0)
MCHC: 33.8 g/dL (ref 30.0–36.0)
Monocytes Absolute: 0.7 10*3/uL (ref 0.1–1.0)
Monocytes Relative: 6 % (ref 3–12)
Neutrophils Relative %: 77 % (ref 43–77)
RBC: 4.39 MIL/uL (ref 4.22–5.81)

## 2012-09-21 LAB — URINALYSIS, ROUTINE W REFLEX MICROSCOPIC
Bilirubin Urine: NEGATIVE
Glucose, UA: NEGATIVE mg/dL
Ketones, ur: NEGATIVE mg/dL
Leukocytes, UA: NEGATIVE
Nitrite: NEGATIVE
Specific Gravity, Urine: 1.006 (ref 1.005–1.030)
pH: 6.5 (ref 5.0–8.0)

## 2012-09-21 LAB — PROTIME-INR: INR: 1.28 (ref 0.00–1.49)

## 2012-09-21 MED ORDER — AMOXICILLIN-POT CLAVULANATE 875-125 MG PO TABS: 1.0000 | ORAL_TABLET | Freq: Two times a day (BID) | ORAL | Status: DC

## 2012-09-21 MED ORDER — AMOXICILLIN-POT CLAVULANATE 875-125 MG PO TABS
1.0000 | ORAL_TABLET | Freq: Once | ORAL | Status: AC
  Administered 2012-09-21: 1 via ORAL
  Filled 2012-09-21: qty 1

## 2012-09-21 MED ORDER — DOXYCYCLINE HYCLATE 100 MG PO CAPS: 100.0000 mg | ORAL_CAPSULE | Freq: Two times a day (BID) | ORAL | Status: DC

## 2012-09-21 MED ORDER — DOXYCYCLINE HYCLATE 100 MG PO TABS
100.0000 mg | ORAL_TABLET | Freq: Once | ORAL | Status: AC
  Administered 2012-09-21: 100 mg via ORAL
  Filled 2012-09-21: qty 1

## 2012-09-21 NOTE — ED Provider Notes (Signed)
History     CSN: [id number]  Arrival date & time 09/21/12  0116   First MD Initiated Contact with Patient 09/21/12 0129      Chief Complaint  Patient presents with  . sweating     (Consider location/radiation/quality/duration/timing/severity/associated sxs/prior treatment) Patient is a 72 y.o. male presenting with hypertension. The history is provided by the patient.  Hypertension This is a chronic problem. The current episode started more than 1 week ago. The problem occurs constantly. The problem has been gradually worsening. Pertinent negatives include no chest pain, no abdominal pain and no shortness of breath. Nothing aggravates the symptoms. Nothing relieves the symptoms. Treatments tried: BP meds.  BP cuff has not been working at home x 2 weeks and does not know how his BPs are.  Also has had 3 weeks of sweating.  No cough.  No fevers, no weight loss.  No DOE no SOB no CP.  No neck pain no neck stiffness, no rashes on the skin.  No weakness nor numbness no changes in speech.  Just completed 28 days of steroids for his rheumatoid arthritis and wonders if BP is up and sweating is caused by same.    Past Medical History  Diagnosis Date  . Nephrolithiasis   . Arthritis     rheumatoid  . Colon polyp 2000  . Atrial fibrillation   . Unspecified essential hypertension   . Gout     Past Surgical History  Procedure Laterality Date  . Spine surgery      x 2  . Joint replacement      Total L-Hip replacement, Right Knee 10/20/09  . Cholecystectomy  1994    Family History  Problem Relation Age of Onset  . Hypertension Mother   . Arthritis Mother     ?RA  . Hypertension Father     History  Substance Use Topics  . Smoking status: Never Smoker   . Smokeless tobacco: Never Used  . Alcohol Use: Yes     Comment: occasional      Review of Systems  Constitutional: Negative for fever and chills.  HENT: Negative for sore throat, neck pain and neck stiffness.   Respiratory:  Negative for shortness of breath.   Cardiovascular: Negative for chest pain.  Gastrointestinal: Negative for vomiting and abdominal pain.  Genitourinary: Negative for dysuria.  Skin: Negative for rash.  Neurological: Negative for dizziness, facial asymmetry, weakness and numbness.  Psychiatric/Behavioral: Negative for confusion.    Allergies  Review of patient's allergies indicates no known allergies.  Home Medications   Current Outpatient Rx  Name  Route  Sig  Dispense  Refill  . allopurinol (ZYLOPRIM) 100 MG tablet      Take 2 tablets by mouth daily.   60 tablet   2   . cloNIDine (CATAPRES) 0.1 MG tablet      TAKE 1 TABLET (0.1 MG TOTAL) BY MOUTH TWO   (TWO) TIMES DAILY.   60 tablet   3   . colchicine (COLCRYS) 0.6 MG tablet      Take 2 tablets by mouth AT START OF GOUT FLARE AND 1 TABLET 3 HOURS LATER. MAX 3 TABLETS PER GOUT ATTACK.   20 tablet   3   . diltiazem (CARDIZEM) 60 MG tablet   Oral   Take 2 tablets (120 mg total) by mouth 2 (two) times daily.   120 tablet   3   . DIOVAN 320 MG tablet      TAKE 1  TABLET BY MOUTH EVERY DAY   30 tablet   3   . furosemide (LASIX) 40 MG tablet   Oral   Take 1 tablet (40 mg total) by mouth 2 (two) times daily.   60 tablet   2   . hydroxychloroquine (PLAQUENIL) 200 MG tablet   Oral   Take 1 tablet by mouth Twice daily.         . predniSONE (STERAPRED UNI-PAK) 10 MG tablet   Oral   Take 10 mg by mouth daily.         . sildenafil (VIAGRA) 100 MG tablet   Oral   Take 100 mg by mouth daily as needed.           . traMADol (ULTRAM) 50 MG tablet   Oral   Take 50 mg by mouth Twice daily.         [name] warfarin (COUMADIN) 7.5 MG tablet   Oral   Take 1 tablet (7.5 mg total) by mouth as directed.   40 tablet   3   . fluticasone (FLONASE) 50 MCG/ACT nasal spray   Nasal   Place 2 sprays into the nose daily.   16 g   6   . methocarbamol (ROBAXIN) 500 MG tablet   Oral   Take 500 mg by mouth 4 (four) times  daily as needed.           . valACYclovir (VALTREX) 500 MG tablet   Oral   Take 500 mg by mouth 2 (two) times daily. Take for 3 days at start of herpes flare           BP 216/100  Pulse 69  Temp(Src) 99.1 F (37.3 C) (Oral)  Resp 18  Ht 6\' 2"  (1.88 m)  Wt 282 lb (127.914 kg)  BMI 36.19 kg/m2  SpO2 100%  Physical Exam  Constitutional: He is oriented to person, place, and time. He appears well-developed and well-nourished. No distress.  HENT:  Head: Normocephalic and atraumatic.  Right Ear: No mastoid tenderness. Tympanic membrane is not injected.  Left Ear: No mastoid tenderness. Tympanic membrane is not injected.  Mouth/Throat: Oropharynx is clear and moist.  Eyes: Conjunctivae are normal. Pupils are equal, round, and reactive to light.  Neck: Normal range of motion. Neck supple.  Cardiovascular: Intact distal pulses.  An irregular rhythm present. Bradycardia present.   Pulmonary/Chest: Effort normal and breath sounds normal. No respiratory distress. He has no wheezes. He has no rales.  Abdominal: Soft. Bowel sounds are normal. There is no tenderness. There is no rebound and no guarding.  Musculoskeletal: Normal range of motion.  Lymphadenopathy:    He has no cervical adenopathy.  Neurological: He is alert and oriented to person, place, and time. He has normal reflexes.  Skin: Skin is warm and dry. No rash noted. He is not diaphoretic.  Psychiatric: He has a normal mood and affect.    ED Course  Procedures (including critical care time)  Labs Reviewed  CBC WITH DIFFERENTIAL  COMPREHENSIVE METABOLIC PANEL  TROPONIN I  PROTIME-INR  URINALYSIS, ROUTINE W REFLEX MICROSCOPIC   No results found.   No diagnosis found.    MDM   Date: 09/21/2012  Rate: 50  Rhythm: atrial fibrillation  QRS Axis: left  Intervals: normal  ST/T Wave abnormalities: normal  Conduction Disutrbances:none  Narrative Interpretation:   Old EKG Reviewed: unchanged    Sweating of 3  days duration without additional cardiac symptoms in the setting of unchanged, negative  EKG and negative troponin is sufficient to exclude ACS.  Doubt cardaic etiology in light of temp of 99 and early pneumonia seen on CXR; sweating is likely related to infection.  Based on CURB 65 score; negative/reassuring exam patient can be treated as an outpatient with close follow up.  Given recent steroids EDP with treat for both typical and atypical pathogens and consulted pharmacy regarding INR on antibiotics.  Augmentin covers s. pneumoniae and typicals and atypicals will be covered by doxycycline and the combination has the smallest potential to increase INR.  Patient is instructed to call his PMD in am to ask about coumadin adjustment (states he has been eating a lot of greens this week) in the setting of antibiotic use.  He will need to be seen for recheck and PT INR by his doctor on Monday.  States he already has appointment for INR check on Monday.  Return for fevers > 100.4, shaking chills shortness of breath, inability to tolerate antibiotics or any concerns.  Patient verbalizes understanding and agrees to follow up      April Alfonso Patten, MD 09/21/12 [id number]

## 2012-09-21 NOTE — ED Notes (Signed)
Patient transported to X-ray 

## 2012-09-21 NOTE — ED Notes (Signed)
Pt asks, "how long is this going to take, because the weather is getting bad out there." Explained to pt he will have to wait to be evaluated by MD and testing will be ordered by MD, depending on what test are ordered as to length of stay.

## 2012-09-21 NOTE — ED Notes (Signed)
Pt states the elevated blood pressure and sweating started after starting prednisone. Pt states he finished prednisone dose pack yesterday.

## 2012-09-21 NOTE — ED Notes (Signed)
Pt states he was has been sweating around neck and shoulder area x 3 days.

## 2012-09-24 ENCOUNTER — Ambulatory Visit (INDEPENDENT_AMBULATORY_CARE_PROVIDER_SITE_OTHER): Payer: Medicare Other | Admitting: *Deleted

## 2012-09-24 DIAGNOSIS — Z7901 Long term (current) use of anticoagulants: Secondary | ICD-10-CM | POA: Diagnosis not present

## 2012-09-24 DIAGNOSIS — I4891 Unspecified atrial fibrillation: Secondary | ICD-10-CM | POA: Diagnosis not present

## 2012-09-25 ENCOUNTER — Encounter: Payer: Self-pay | Admitting: Family

## 2012-09-25 ENCOUNTER — Ambulatory Visit (INDEPENDENT_AMBULATORY_CARE_PROVIDER_SITE_OTHER): Payer: Medicare Other | Admitting: Family

## 2012-09-25 VITALS — BP 154/90 | HR 57 | Temp 97.9°F | Resp 18 | Ht 72.0 in | Wt 287.1 lb

## 2012-09-25 DIAGNOSIS — J189 Pneumonia, unspecified organism: Secondary | ICD-10-CM | POA: Diagnosis not present

## 2012-09-25 NOTE — Assessment & Plan Note (Signed)
Continue antibiotics.  Plan follow up CXR upon completion to ensure resolution.   Clinically resolving.

## 2012-09-25 NOTE — Progress Notes (Signed)
Subjective:    Patient ID: Bruce Cole, male    DOB: [birth year], 72 y.o.   MRN: [name]  HPI  Bruce Cole is a 72 yr old male who presents today for ED follow up.  He was seen on 09/21/13 and diabnosed with penumonia. ED records are reviewed. Symptoms had been present for 1 week at the time of his ED visit. A CXR was performed and revealed central venous congestion and concern for early left lower lobe pneumonia. He was treated with Doxy and Augmentin. He reports that he feels fine.  He denies sob, chest pain or LE edema.    Review of Systems See HPI  Past Medical History  Diagnosis Date  . Nephrolithiasis   . Arthritis     rheumatoid  . Colon polyp 2000  . Atrial fibrillation   . Unspecified essential hypertension   . Gout     History   Social History  . Marital Status: Single    Spouse Name: N/A    Number of Children: 3  . Years of Education: N/A   Occupational History  .     Social History Main Topics  . Smoking status: Never Smoker   . Smokeless tobacco: Never Used  . Alcohol Use: Yes     Comment: occasional  . Drug Use: No  . Sexually Active: Not on file   Other Topics Concern  . 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 Spine surgery      x 2  . Joint replacement      Total L-Hip replacement, Right Knee 10/20/09  . Cholecystectomy  1994    Family History  Problem Relation Age of Onset  . Hypertension Mother   . Arthritis Mother     ?RA  . Hypertension Father     No Known Allergies  Current Outpatient Prescriptions on File Prior to Visit  Medication Sig Dispense Refill  . allopurinol (ZYLOPRIM) 100 MG tablet Take 2 tablets by mouth daily.  60 tablet  2  . amoxicillin-clavulanate (AUGMENTIN) 875-125 MG per tablet Take 1 tablet by mouth 2 (two) times daily. One po bid x 10 days  20 tablet  0  . cloNIDine (CATAPRES) 0.1 MG tablet TAKE 1 TABLET  (0.1 MG TOTAL) BY MOUTH TWO   (TWO) TIMES DAILY.  60 tablet  3  . colchicine (COLCRYS) 0.6 MG tablet Take 2 tablets by mouth AT START OF GOUT FLARE AND 1 TABLET 3 HOURS LATER. MAX 3 TABLETS PER GOUT ATTACK.  20 tablet  3  . diltiazem (CARDIZEM) 60 MG tablet Take 2 tablets (120 mg total) by mouth 2 (two) times daily.  120 tablet  3  . DIOVAN 320 MG tablet TAKE 1 TABLET BY MOUTH EVERY DAY  30 tablet  3  . doxycycline (VIBRAMYCIN) 100 MG capsule Take 1 capsule (100 mg total) by mouth 2 (two) times daily. One po bid x 10 days  20 capsule  0  . fluticasone (FLONASE) 50 MCG/ACT nasal spray Place 2 sprays into the nose daily.  16 g  6  . furosemide (LASIX) 40 MG tablet Take 1 tablet (40 mg total) by mouth 2 (two) times daily.  60 tablet  2  . hydroxychloroquine (PLAQUENIL) 200 MG tablet Take 1 tablet by mouth Twice daily.      . methocarbamol (ROBAXIN) 500 MG tablet Take 500  mg by mouth 4 (four) times daily as needed.        . sildenafil (VIAGRA) 100 MG tablet Take 100 mg by mouth daily as needed.        . traMADol (ULTRAM) 50 MG tablet Take 50 mg by mouth Twice daily.      . valACYclovir (VALTREX) 500 MG tablet Take 500 mg by mouth 2 (two) times daily. Take for 3 days at start of herpes flare      . warfarin (COUMADIN) 7.5 MG tablet Take 1 tablet (7.5 mg total) by mouth as directed.  40 tablet  3  . predniSONE (STERAPRED UNI-PAK) 10 MG tablet Take 10 mg by mouth daily.       No current facility-administered medications on file prior to visit.    BP 154/90  Pulse 57  Temp(Src) 97.9 F (36.6 C) (Oral)  Resp 18  Ht 6' (1.829 m)  Wt 287 lb 1.3 oz (130.219 kg)  BMI 38.93 kg/m2  SpO2 97%       Objective:   Physical Exam  Constitutional: He is oriented to person, place, and time. He appears well-developed and well-nourished. No distress.  Cardiovascular: Normal rate and regular rhythm.   No murmur heard. Pulmonary/Chest: Effort normal and breath sounds normal. No respiratory distress. He has  no wheezes. He has no rales. He exhibits no tenderness.  Musculoskeletal: He exhibits no edema.  Neurological: He is alert and oriented to person, place, and time.  Psychiatric: He has a normal mood and affect. His behavior is normal. Judgment and thought content normal.          Assessment & Plan:

## 2012-09-25 NOTE — Patient Instructions (Addendum)
Please return to the imaging department in 1 week for follow up chest x ray. Complete your antibiotics.

## 2012-10-01 ENCOUNTER — Ambulatory Visit (INDEPENDENT_AMBULATORY_CARE_PROVIDER_SITE_OTHER): Payer: Medicare Other | Admitting: *Deleted

## 2012-10-01 ENCOUNTER — Ambulatory Visit (HOSPITAL_BASED_OUTPATIENT_CLINIC_OR_DEPARTMENT_OTHER)
Admission: RE | Admit: 2012-10-01 | Discharge: 2012-10-01 | Disposition: A | Discharge status: Home / Self Care | Payer: Medicare Other | Source: Ambulatory Visit | Attending: Family | Admitting: Family

## 2012-10-01 DIAGNOSIS — I4891 Unspecified atrial fibrillation: Secondary | ICD-10-CM | POA: Insufficient documentation

## 2012-10-01 DIAGNOSIS — J984 Other disorders of lung: Secondary | ICD-10-CM | POA: Diagnosis not present

## 2012-10-01 DIAGNOSIS — Z7901 Long term (current) use of anticoagulants: Secondary | ICD-10-CM | POA: Diagnosis not present

## 2012-10-01 DIAGNOSIS — J189 Pneumonia, unspecified organism: Secondary | ICD-10-CM | POA: Diagnosis not present

## 2012-10-01 DIAGNOSIS — I517 Cardiomegaly: Secondary | ICD-10-CM | POA: Diagnosis not present

## 2012-10-01 LAB — POCT INR: INR: 2.1

## 2012-10-22 ENCOUNTER — Other Ambulatory Visit: Payer: Self-pay | Admitting: Internal Medicine

## 2012-10-22 ENCOUNTER — Telehealth: Payer: Self-pay | Admitting: *Deleted

## 2012-10-22 MED ORDER — ALLOPURINOL 100 MG PO TABS: ORAL_TABLET | ORAL | Status: DC

## 2012-10-22 NOTE — Telephone Encounter (Signed)
Rx request to pharmacy/SLS  

## 2012-10-23 ENCOUNTER — Ambulatory Visit (INDEPENDENT_AMBULATORY_CARE_PROVIDER_SITE_OTHER): Payer: Medicare Other

## 2012-10-23 DIAGNOSIS — Z7901 Long term (current) use of anticoagulants: Secondary | ICD-10-CM | POA: Diagnosis not present

## 2012-10-23 DIAGNOSIS — I4891 Unspecified atrial fibrillation: Secondary | ICD-10-CM | POA: Diagnosis not present

## 2012-10-23 LAB — POCT INR: INR: 2.5

## 2012-10-25 DIAGNOSIS — M109 Gout, unspecified: Secondary | ICD-10-CM | POA: Diagnosis not present

## 2012-10-25 DIAGNOSIS — Z09 Encounter for follow-up examination after completed treatment for conditions other than malignant neoplasm: Secondary | ICD-10-CM | POA: Diagnosis not present

## 2012-10-25 DIAGNOSIS — M1710 Unilateral primary osteoarthritis, unspecified knee: Secondary | ICD-10-CM | POA: Diagnosis not present

## 2012-10-25 DIAGNOSIS — M069 Rheumatoid arthritis, unspecified: Secondary | ICD-10-CM | POA: Diagnosis not present

## 2012-11-06 ENCOUNTER — Other Ambulatory Visit: Payer: Self-pay | Admitting: Emergency Medicine

## 2012-11-06 MED ORDER — WARFARIN SODIUM 7.5 MG PO TABS: ORAL_TABLET | ORAL | Status: DC

## 2012-11-19 ENCOUNTER — Ambulatory Visit (INDEPENDENT_AMBULATORY_CARE_PROVIDER_SITE_OTHER): Payer: Medicare Other | Admitting: Family

## 2012-11-19 ENCOUNTER — Encounter: Payer: Self-pay | Admitting: Family

## 2012-11-19 VITALS — BP 170/90 | HR 53 | Temp 98.3°F | Resp 16 | Ht 72.0 in | Wt 294.1 lb

## 2012-11-19 DIAGNOSIS — I10 Essential (primary) hypertension: Secondary | ICD-10-CM | POA: Diagnosis not present

## 2012-11-19 MED ORDER — HYDRALAZINE HCL 25 MG PO TABS: 25.0000 mg | ORAL_TABLET | Freq: Three times a day (TID) | ORAL | Status: DC

## 2012-11-19 NOTE — Progress Notes (Signed)
Subjective:    Patient ID: Bruce Cole, male    DOB: [birth year], 72 y.o.   MRN: [id number]  HPI  Patient presents today for followup of hypertension.  Patient has been treated for Chronic HTN for quiet sometime. He is currently on clonidine, ditiazem, diovan and furosemed and furosemide.  No associated S/S related to HTN.   Quality: chronic Modifying factor: meds Duration: Quite sometime Associated S/S: None.  He has had a lot of stress.  He reports that his home BP's were 128-140/90 at home.  Monitor broke about 2 weeks ago.    The patient denies the following associated symptoms: Chest pain, SOB or LE edema   Review of Systems See HPI  Past Medical History  Diagnosis Date  . Nephrolithiasis   . Arthritis     rheumatoid  . Colon polyp 2000  . Atrial fibrillation   . Unspecified essential hypertension   . Gout     History   Social History  . Marital Status: Single    Spouse Name: N/A    Number of Children: 3  . Years of Education: N/A   Occupational History  .     Social History Main Topics  . Smoking status: Never Smoker   . Smokeless tobacco: Never Used  . Alcohol Use: Yes     Comment: occasional  . Drug Use: No  . Sexually Active: Not on file   Other Topics Concern  . 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 Spine surgery      x 2  . Joint replacement      Total L-Hip replacement, Right Knee 10/20/09  . Cholecystectomy  1994    Family History  Problem Relation Age of Onset  . Hypertension Mother   . Arthritis Mother     ?RA  . Hypertension Father     No Known Allergies  Current Outpatient Prescriptions on File Prior to Visit  Medication Sig Dispense Refill  . allopurinol (ZYLOPRIM) 100 MG tablet Take 2 tablets by mouth daily.  60 tablet  2  . cloNIDine (CATAPRES) 0.1 MG tablet TAKE 1 TABLET (0.1 MG TOTAL) BY MOUTH TWO   (TWO) TIMES  DAILY.  60 tablet  3  . colchicine (COLCRYS) 0.6 MG tablet Take 2 tablets by mouth AT START OF GOUT FLARE AND 1 TABLET 3 HOURS LATER. MAX 3 TABLETS PER GOUT ATTACK.  20 tablet  3  . diltiazem (CARDIZEM) 60 MG tablet Take 2 tablets (120 mg total) by mouth 2 (two) times daily.  120 tablet  3  . DIOVAN 320 MG tablet TAKE 1 TABLET BY MOUTH EVERY DAY  30 tablet  3  . fluticasone (FLONASE) 50 MCG/ACT nasal spray Place 2 sprays into the nose daily.  16 g  6  . furosemide (LASIX) 40 MG tablet Take 1 tablet (40 mg total) by mouth 2 (two) times daily.  60 tablet  2  . hydroxychloroquine (PLAQUENIL) 200 MG tablet Take 1 tablet by mouth Twice daily.      . methocarbamol (ROBAXIN) 500 MG tablet Take 500 mg by mouth 4 (four) times daily as needed.        . sildenafil (VIAGRA) 100 MG tablet Take 100 mg by mouth daily as needed.        . traMADol (ULTRAM) 50 MG tablet Take 50 mg by mouth Twice daily.      [name]  valACYclovir (VALTREX) 500 MG tablet Take 500 mg by mouth 2 (two) times daily. Take for 3 days at start of herpes flare      . warfarin (COUMADIN) 7.5 MG tablet Take as directed by coumadin clinic  40 tablet  3   No current facility-administered medications on file prior to visit.    BP 170/90  Pulse 53  Temp(Src) 98.3 F (36.8 C) (Oral)  Resp 16  Ht 6' (1.829 m)  Wt 294 lb 1.9 oz (133.412 kg)  BMI 39.88 kg/m2  SpO2 97%       Objective:   Physical Exam  Constitutional: He is oriented to person, place, and time. He appears well-developed and well-nourished. No distress.  Cardiovascular: Normal rate and regular rhythm.   No murmur heard. Pulmonary/Chest: Effort normal and breath sounds normal. No respiratory distress. He has no wheezes. He has no rales. He exhibits no tenderness.  Musculoskeletal: He exhibits no edema.  Neurological: He is alert and oriented to person, place, and time.  Psychiatric: He has a normal mood and affect. His behavior is normal. Judgment and thought content normal.           Assessment & Plan:

## 2012-11-19 NOTE — Patient Instructions (Addendum)
Please follow up in 2 weeks

## 2012-11-19 NOTE — Assessment & Plan Note (Signed)
Deteriorated.  Will give trial of hydralazine.

## 2012-11-20 ENCOUNTER — Ambulatory Visit (INDEPENDENT_AMBULATORY_CARE_PROVIDER_SITE_OTHER): Payer: Medicare Other | Admitting: *Deleted

## 2012-11-20 DIAGNOSIS — I4891 Unspecified atrial fibrillation: Secondary | ICD-10-CM | POA: Diagnosis not present

## 2012-11-20 DIAGNOSIS — Z7901 Long term (current) use of anticoagulants: Secondary | ICD-10-CM

## 2012-11-20 LAB — POCT INR: INR: 3.3

## 2012-12-03 ENCOUNTER — Ambulatory Visit: Payer: Medicare Other | Admitting: Family

## 2012-12-03 ENCOUNTER — Encounter: Payer: Self-pay | Admitting: Family

## 2012-12-03 ENCOUNTER — Ambulatory Visit (INDEPENDENT_AMBULATORY_CARE_PROVIDER_SITE_OTHER): Payer: Medicare Other | Admitting: Family

## 2012-12-03 VITALS — BP 166/98 | HR 52 | Temp 97.8°F | Resp 16 | Ht 72.0 in | Wt 289.1 lb

## 2012-12-03 DIAGNOSIS — I10 Essential (primary) hypertension: Secondary | ICD-10-CM

## 2012-12-03 MED ORDER — HYDRALAZINE HCL 50 MG PO TABS: 50.0000 mg | ORAL_TABLET | Freq: Three times a day (TID) | ORAL | Status: DC

## 2012-12-03 MED ORDER — CLONIDINE HCL 0.1 MG PO TABS: ORAL_TABLET | ORAL | Status: DC

## 2012-12-03 NOTE — Patient Instructions (Signed)
Please increase clonidine to 3 times daily. Increase hydralazine to 50mg  3 times daily. You will be contacted about schedule your renal doppler. Follow up in 1 month.

## 2012-12-03 NOTE — Progress Notes (Signed)
Subjective:    Patient ID: Bruce Cole, male    DOB: September 22, 1940, 72 y.o.   MRN: [name]  HPI  Bruce Cole is a 72 yr old male who presents today for follow up of his HTN.  Home monitor is not giving him a good reading.    Currently on diovan, Catapres, hydralazine, cardizem, furosemide and most recently started on hydralazine. Reports low grad headache.     Review of Systems    see HPI  Past Medical History  Diagnosis Date  . Nephrolithiasis   . Arthritis     rheumatoid  . Colon polyp 2000  . Atrial fibrillation   . Unspecified essential hypertension   . Gout     History   Social History  . Marital Status: Single    Spouse Name: N/A    Number of Children: 3  . Years of Education: N/A   Occupational History  .     Social History Main Topics  . Smoking status: Never Smoker   . Smokeless tobacco: Never Used  . Alcohol Use: Yes     Comment: occasional  . Drug Use: No  . Sexually Active: Not on file   Other Topics Concern  . 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 Spine surgery      x 2  . Joint replacement      Total L-Hip replacement, Right Knee 10/20/09  . Cholecystectomy  1994    Family History  Problem Relation Age of Onset  . Hypertension Mother   . Arthritis Mother     ?RA  . Hypertension Father     No Known Allergies  Current Outpatient Prescriptions on File Prior to Visit  Medication Sig Dispense Refill  . allopurinol (ZYLOPRIM) 100 MG tablet Take 2 tablets by mouth daily.  60 tablet  2  . cloNIDine (CATAPRES) 0.1 MG tablet TAKE 1 TABLET (0.1 MG TOTAL) BY MOUTH TWO   (TWO) TIMES DAILY.  60 tablet  3  . colchicine (COLCRYS) 0.6 MG tablet Take 2 tablets by mouth AT START OF GOUT FLARE AND 1 TABLET 3 HOURS LATER. MAX 3 TABLETS PER GOUT ATTACK.  20 tablet  3  . diltiazem (CARDIZEM) 60 MG tablet Take 2 tablets (120 mg total) by mouth 2  (two) times daily.  120 tablet  3  . DIOVAN 320 MG tablet TAKE 1 TABLET BY MOUTH EVERY DAY  30 tablet  3  . fluticasone (FLONASE) 50 MCG/ACT nasal spray Place 2 sprays into the nose daily.  16 g  6  . furosemide (LASIX) 40 MG tablet Take 1 tablet (40 mg total) by mouth 2 (two) times daily.  60 tablet  2  . hydrALAZINE (APRESOLINE) 25 MG tablet Take 1 tablet (25 mg total) by mouth 3 (three) times daily.  90 tablet  0  . hydroxychloroquine (PLAQUENIL) 200 MG tablet Take 1 tablet by mouth Twice daily.      . methocarbamol (ROBAXIN) 500 MG tablet Take 500 mg by mouth 4 (four) times daily as needed.        . sildenafil (VIAGRA) 100 MG tablet Take 100 mg by mouth daily as needed.        . traMADol (ULTRAM) 50 MG tablet Take 50 mg by mouth Twice daily.      . valACYclovir (VALTREX) 500 MG tablet Take 500 mg by  mouth 2 (two) times daily. Take for 3 days at start of herpes flare      . warfarin (COUMADIN) 7.5 MG tablet Take as directed by coumadin clinic  40 tablet  3   No current facility-administered medications on file prior to visit.    BP 166/98  Pulse 52  Temp(Src) 97.8 F (36.6 C) (Oral)  Resp 16  Ht 6' (1.829 m)  Wt 289 lb 0.8 oz (131.112 kg)  BMI 39.19 kg/m2  SpO2 99%    Objective:   Physical Exam  Constitutional: He appears well-developed and well-nourished. No distress.  Cardiovascular: Normal rate and regular rhythm.   No murmur heard. Pulmonary/Chest: Effort normal and breath sounds normal. No respiratory distress. He has no wheezes. He has no rales. He exhibits no tenderness.  Psychiatric: He has a normal mood and affect. His behavior is normal. Judgment and thought content normal.          Assessment & Plan:

## 2012-12-04 NOTE — Assessment & Plan Note (Addendum)
Deteriorated.  When I checked BP my reading was 180/104.  Increase hydralazine from 25mg  to 50mg  TID.  Increase clonidine 0.1 bid to 0.1 tid. Will also obtain Renal artery doppler to evaluate for renal stenosis.

## 2012-12-05 ENCOUNTER — Telehealth: Payer: Self-pay | Admitting: Family

## 2012-12-05 ENCOUNTER — Emergency Department (HOSPITAL_BASED_OUTPATIENT_CLINIC_OR_DEPARTMENT_OTHER)
Admission: EM | Admit: 2012-12-05 | Discharge: 2012-12-05 | Disposition: A | Discharge status: Home / Self Care | Payer: Medicare Other | Attending: Emergency Medicine | Admitting: Emergency Medicine

## 2012-12-05 ENCOUNTER — Emergency Department (HOSPITAL_BASED_OUTPATIENT_CLINIC_OR_DEPARTMENT_OTHER): Payer: Medicare Other

## 2012-12-05 ENCOUNTER — Encounter (HOSPITAL_BASED_OUTPATIENT_CLINIC_OR_DEPARTMENT_OTHER): Payer: Self-pay | Admitting: Emergency Medicine

## 2012-12-05 DIAGNOSIS — R0989 Other specified symptoms and signs involving the circulatory and respiratory systems: Secondary | ICD-10-CM | POA: Diagnosis not present

## 2012-12-05 DIAGNOSIS — I10 Essential (primary) hypertension: Secondary | ICD-10-CM | POA: Diagnosis not present

## 2012-12-05 DIAGNOSIS — Z87442 Personal history of urinary calculi: Secondary | ICD-10-CM | POA: Diagnosis not present

## 2012-12-05 DIAGNOSIS — Z860100 Personal history of colon polyps, unspecified: Secondary | ICD-10-CM | POA: Insufficient documentation

## 2012-12-05 DIAGNOSIS — Z7901 Long term (current) use of anticoagulants: Secondary | ICD-10-CM | POA: Insufficient documentation

## 2012-12-05 DIAGNOSIS — Z8679 Personal history of other diseases of the circulatory system: Secondary | ICD-10-CM | POA: Diagnosis not present

## 2012-12-05 DIAGNOSIS — Z8739 Personal history of other diseases of the musculoskeletal system and connective tissue: Secondary | ICD-10-CM | POA: Insufficient documentation

## 2012-12-05 DIAGNOSIS — M109 Gout, unspecified: Secondary | ICD-10-CM | POA: Insufficient documentation

## 2012-12-05 DIAGNOSIS — E876 Hypokalemia: Secondary | ICD-10-CM

## 2012-12-05 DIAGNOSIS — Z79899 Other long term (current) drug therapy: Secondary | ICD-10-CM | POA: Insufficient documentation

## 2012-12-05 DIAGNOSIS — R51 Headache: Secondary | ICD-10-CM | POA: Diagnosis not present

## 2012-12-05 LAB — URINALYSIS, ROUTINE W REFLEX MICROSCOPIC
Bilirubin Urine: NEGATIVE
Hgb urine dipstick: NEGATIVE
Ketones, ur: NEGATIVE mg/dL
Protein, ur: 30 mg/dL — AB
Urobilinogen, UA: 1 mg/dL (ref 0.0–1.0)

## 2012-12-05 LAB — POCT I-STAT, CHEM 8
Calcium, Ion: 1.14 mmol/L (ref 1.13–1.30)
Creatinine, Ser: 1 mg/dL (ref 0.50–1.35)
Glucose, Bld: 117 mg/dL — ABNORMAL HIGH (ref 70–99)
Hemoglobin: 12.9 g/dL — ABNORMAL LOW (ref 13.0–17.0)
Potassium: 3.1 mEq/L — ABNORMAL LOW (ref 3.5–5.1)
TCO2: 30 mmol/L (ref 0–100)

## 2012-12-05 LAB — PROTIME-INR
INR: 1.99 — ABNORMAL HIGH (ref 0.00–1.49)
Prothrombin Time: 21.8 seconds — ABNORMAL HIGH (ref 11.6–15.2)

## 2012-12-05 LAB — CBC WITH DIFFERENTIAL/PLATELET
Basophils Absolute: 0 10*3/uL (ref 0.0–0.1)
Basophils Relative: 0 % (ref 0–1)
Eosinophils Absolute: 0.2 10*3/uL (ref 0.0–0.7)
MCH: 31.2 pg (ref 26.0–34.0)
MCHC: 34 g/dL (ref 30.0–36.0)
Neutro Abs: 4.6 10*3/uL (ref 1.7–7.7)
Neutrophils Relative %: 63 % (ref 43–77)
Platelets: 171 10*3/uL (ref 150–400)
RBC: 3.91 MIL/uL — ABNORMAL LOW (ref 4.22–5.81)

## 2012-12-05 LAB — URINE MICROSCOPIC-ADD ON

## 2012-12-05 LAB — TROPONIN I: Troponin I: <0.3 ng/mL (ref <0.30)

## 2012-12-05 MED ORDER — AMLODIPINE BESYLATE 5 MG PO TABS
ORAL_TABLET | ORAL | Status: AC
  Administered 2012-12-05: 10 mg via ORAL
  Filled 2012-12-05: qty 2

## 2012-12-05 MED ORDER — POTASSIUM CHLORIDE CRYS ER 20 MEQ PO TBCR
40.0000 meq | EXTENDED_RELEASE_TABLET | Freq: Once | ORAL | Status: AC
  Administered 2012-12-05: 40 meq via ORAL
  Filled 2012-12-05: qty 2

## 2012-12-05 MED ORDER — HYDRALAZINE HCL 20 MG/ML IJ SOLN: 5.0000 mg | Freq: Once | INTRAMUSCULAR | Status: DC

## 2012-12-05 MED ORDER — CARVEDILOL 25 MG PO TABS: 25.0000 mg | ORAL_TABLET | Freq: Two times a day (BID) | ORAL | Status: DC

## 2012-12-05 MED ORDER — AMLODIPINE BESYLATE 5 MG PO TABS: 10.0000 mg | ORAL_TABLET | Freq: Once | ORAL | Status: AC

## 2012-12-05 MED ORDER — FUROSEMIDE 40 MG PO TABS
40.0000 mg | ORAL_TABLET | Freq: Once | ORAL | Status: AC
  Administered 2012-12-05: 40 mg via ORAL
  Filled 2012-12-05: qty 1

## 2012-12-05 MED ORDER — AMLODIPINE BESYLATE 10 MG PO TABS: 10.0000 mg | ORAL_TABLET | Freq: Every day | ORAL | Status: DC

## 2012-12-05 MED ORDER — TRAMADOL HCL 50 MG PO TABS
50.0000 mg | ORAL_TABLET | Freq: Once | ORAL | Status: AC
  Administered 2012-12-05: 50 mg via ORAL
  Filled 2012-12-05: qty 1

## 2012-12-05 MED ORDER — POTASSIUM CHLORIDE CRYS ER 20 MEQ PO TBCR: 20.0000 meq | EXTENDED_RELEASE_TABLET | Freq: Every day | ORAL | Status: DC

## 2012-12-05 MED ORDER — ACETAMINOPHEN 500 MG PO TABS
1000.0000 mg | ORAL_TABLET | Freq: Once | ORAL | Status: AC
  Administered 2012-12-05: 1000 mg via ORAL
  Filled 2012-12-05: qty 2

## 2012-12-05 NOTE — Telephone Encounter (Signed)
Patient called in stating that he went to the ED last night for high BP. He says that the ED physician gave him a 5 day supply of norvasc(sp?) and would like to know if Lenna Sciara would call him in more to last him until his appointment. I scheduled ED follow up for 12/18/12. That was the first available. [facility] on [address].

## 2012-12-05 NOTE — ED Notes (Signed)
Pt reports PMD changed his BP medication x 2 weeks ago. Pt c/o headaches when laying down x 1 week.

## 2012-12-05 NOTE — Telephone Encounter (Signed)
Rx had printed and was called to [place] at [place].

## 2012-12-05 NOTE — ED Notes (Signed)
Pt went to PMD office mon and dosage of BP medication was increased.

## 2012-12-05 NOTE — ED Provider Notes (Signed)
History     CSN: [id number]  Arrival date & time 12/05/12  0443   First MD Initiated Contact with Patient 12/05/12 [id number]      Chief Complaint  Patient presents with  . Headache    (Consider location/radiation/quality/duration/timing/severity/associated sxs/prior treatment) Patient is a 72 y.o. male presenting with hypertension. The history is provided by the patient.  Hypertension This is a chronic problem. The current episode started more than 1 week ago. The problem occurs constantly. The problem has been gradually worsening. Associated symptoms include headaches. Pertinent negatives include no chest pain, no abdominal pain and no shortness of breath. Nothing aggravates the symptoms. Nothing relieves the symptoms. He has tried nothing for the symptoms. The treatment provided no relief.  Has had 3+ weeks pf Ha.  Seen 2 weeks ago by his PMD who started him on clonidine and Hydralazine.  Went back to the PMD with same complaints and clonidine and hydralazine were increased but symptoms have not abated.  No weakness nor numbness.  No changes in vision or speech.    Past Medical History  Diagnosis Date  . Nephrolithiasis   . Arthritis     rheumatoid  . Colon polyp 2000  . Atrial fibrillation   . Unspecified essential hypertension   . Gout     Past Surgical History  Procedure Laterality Date  . Spine surgery      x 2  . Joint replacement      Total L-Hip replacement, Right Knee 10/20/09  . Cholecystectomy  1994    Family History  Problem Relation Age of Onset  . Hypertension Mother   . Arthritis Mother     ?RA  . Hypertension Father     History  Substance Use Topics  . Smoking status: Never Smoker   . Smokeless tobacco: Never Used  . Alcohol Use: Yes     Comment: occasional      Review of Systems  HENT: Negative for neck pain and neck stiffness.   Eyes: Negative for visual disturbance.  Respiratory: Negative for shortness of breath.   Cardiovascular: Negative  for chest pain.  Gastrointestinal: Negative for abdominal pain.  Neurological: Positive for headaches. Negative for facial asymmetry, speech difficulty, weakness and numbness.  All other systems reviewed and are negative.    Allergies  Review of patient's allergies indicates no known allergies.  Home Medications   Current Outpatient Rx  Name  Route  Sig  Dispense  Refill  . allopurinol (ZYLOPRIM) 100 MG tablet      Take 2 tablets by mouth daily.   60 tablet   2   . cloNIDine (CATAPRES) 0.1 MG tablet      TAKE 1 TABLET (0.1 MG TOTAL) BY MOUTH Three TIMES DAILY.   90 tablet   3   . colchicine (COLCRYS) 0.6 MG tablet      Take 2 tablets by mouth AT START OF GOUT FLARE AND 1 TABLET 3 HOURS LATER. MAX 3 TABLETS PER GOUT ATTACK.   20 tablet   3   . diltiazem (CARDIZEM) 60 MG tablet   Oral   Take 2 tablets (120 mg total) by mouth 2 (two) times daily.   120 tablet   3   . DIOVAN 320 MG tablet      TAKE 1 TABLET BY MOUTH EVERY DAY   30 tablet   3   . fluticasone (FLONASE) 50 MCG/ACT nasal spray   Nasal   Place 2 sprays into the nose daily.  16 g   6   . furosemide (LASIX) 40 MG tablet   Oral   Take 1 tablet (40 mg total) by mouth 2 (two) times daily.   60 tablet   2   . hydrALAZINE (APRESOLINE) 50 MG tablet   Oral   Take 1 tablet (50 mg total) by mouth 3 (three) times daily.   90 tablet   3   . hydroxychloroquine (PLAQUENIL) 200 MG tablet   Oral   Take 1 tablet by mouth Twice daily.         . methocarbamol (ROBAXIN) 500 MG tablet   Oral   Take 500 mg by mouth 4 (four) times daily as needed.           . sildenafil (VIAGRA) 100 MG tablet   Oral   Take 100 mg by mouth daily as needed.           . traMADol (ULTRAM) 50 MG tablet   Oral   Take 50 mg by mouth Twice daily.         . valACYclovir (VALTREX) 500 MG tablet   Oral   Take 500 mg by mouth 2 (two) times daily. Take for 3 days at start of herpes flare         . warfarin (COUMADIN)  7.5 MG tablet      Take as directed by coumadin clinic   40 tablet   3     BP 212/97  Pulse 57  Temp(Src) 98.7 F (37.1 C) (Oral)  Resp 18  Ht 6\' 3"  (1.905 m)  Wt 289 lb (131.09 kg)  BMI 36.12 kg/m2  SpO2 97%  Physical Exam  Constitutional: He is oriented to person, place, and time. He appears well-developed and well-nourished. No distress.  HENT:  Head: Normocephalic and atraumatic.  Mouth/Throat: Oropharynx is clear and moist.  Eyes: Conjunctivae and EOM are normal. Pupils are equal, round, and reactive to light.  Neck: Normal range of motion. Neck supple. No JVD present.  Cardiovascular: Normal rate, regular rhythm and intact distal pulses.   Pulmonary/Chest: Effort normal and breath sounds normal. No stridor. He has no wheezes. He has no rales.  Abdominal: Soft. Bowel sounds are normal. There is no tenderness. There is no rebound and no guarding.  Musculoskeletal: Normal range of motion.  Neurological: He is alert and oriented to person, place, and time. He has normal reflexes. No cranial nerve deficit.  Skin: Skin is warm and dry.  Psychiatric: He has a normal mood and affect.    ED Course  Procedures (including critical care time)  Labs Reviewed  CBC WITH DIFFERENTIAL  TROPONIN I  PROTIME-INR   No results found.   No diagnosis found.    MDM   Date: 12/05/2012  Rate: 48  Rhythm: sinus bradycardia  QRS Axis: left  Intervals: normal  ST/T Wave abnormalities: nonspecific ST changes  Conduction Disutrbances:none  Narrative Interpretation:   Old EKG Reviewed: unchanged  Patient is well appearing is sitting comfortably in room with lights on and has only taken one dose of tylenol at home in the past 3 weeks for symptoms.  Headache was not sudden onset.  Will treat with BP meds and pain medication.  No indication for LP at this time.  Follow up in the am with Dr. Inda Castle for ongoing care.    Pain resolved post norvasc.  Feels markedly improved.  Will  need to inform [place] of INR and stay off green leafy vegetables of which  he states he has been eating a lot.  Will need to talk to Dr. Inda Castle about continuing Norvasc therapy.  Patient verbalizes understanding and agrees to follow up      Ishi Danser Alfonso Patten, MD 12/05/12 [id number]

## 2012-12-05 NOTE — Telephone Encounter (Signed)
Patient notified of rx

## 2012-12-05 NOTE — Telephone Encounter (Signed)
Please advise 

## 2012-12-05 NOTE — Telephone Encounter (Signed)
OK rx sent ?

## 2012-12-05 NOTE — Telephone Encounter (Signed)
Please call pt and let him know that I reviewed his medications with Dr. Lovena Le- cardiology.  He recommended that he stop diltiazem and start carvedilol twice daily.  He should continue the norvasc that he just started.  Norvasc and diltiazem are the same class of medications and so he really should only be on one of them.  Call me Friday AM with daily blood pressures and heart rate (if possible) after starting BP med.

## 2012-12-06 NOTE — Telephone Encounter (Signed)
Notified pt and he voices understanding. 

## 2012-12-07 ENCOUNTER — Telehealth: Payer: Self-pay | Admitting: *Deleted

## 2012-12-07 NOTE — Telephone Encounter (Signed)
Pt left message that his BP today was 165/85. Please advise.

## 2012-12-07 NOTE — Telephone Encounter (Signed)
Will increase carvedilol to 50mg  twice daily.  Left detailed message on home phone.  Recommended that he call tues AM and let us know how his blood pressure is running. Also call if HR <50.

## 2012-12-11 ENCOUNTER — Ambulatory Visit (INDEPENDENT_AMBULATORY_CARE_PROVIDER_SITE_OTHER): Payer: Medicare Other | Admitting: Pharmacist

## 2012-12-11 ENCOUNTER — Telehealth: Payer: Self-pay | Admitting: Family

## 2012-12-11 ENCOUNTER — Telehealth: Payer: Self-pay | Admitting: *Deleted

## 2012-12-11 DIAGNOSIS — I4891 Unspecified atrial fibrillation: Secondary | ICD-10-CM | POA: Diagnosis not present

## 2012-12-11 DIAGNOSIS — Z7901 Long term (current) use of anticoagulants: Secondary | ICD-10-CM | POA: Diagnosis not present

## 2012-12-11 LAB — POCT INR: INR: 3.5

## 2012-12-11 NOTE — Telephone Encounter (Signed)
Pt called with BP reading from the weekend (145/75-80 and HR 48).  Pt wants to make sure that he is supposed to be taking amlodipine and carvedilol? He has stopped diltiazem.  Please advise.

## 2012-12-11 NOTE — Telephone Encounter (Signed)
FUROSEMIDE 40 MG TABLET TAKE 1 TABLET BY MOUTH TWICE DAILY QTY 60

## 2012-12-11 NOTE — Telephone Encounter (Signed)
Message copied by Debbrah Alar on Tue Dec 11, 2012 10:03 AM ------      Message from: O'SULLIVAN, Melainie Krinsky      Created: Wed Dec 05, 2012  9:40 PM       Thanks- I spoke to Dr. Lovena Le his EP/cardiologist.  He recommended discontinuation of ditiazem and to start carvedilol instead as he thought this would help his BP more.              ----- Message -----         From: Mosie Lukes, MD         Sent: 12/05/2012   7:14 PM           To: Debbrah Alar, NP            Encourage DASH diet, consider sleep study, will need to use just one ccb and consider adding Maxzide, repeat renal panel after changes as we discussed, refer back to cardiology if no improvement after renal arteries are investigated.                   ----- Message -----         From: Debbrah Alar, NP         Sent: 12/05/2012  12:12 PM           To: Mosie Lukes, MD            Please see bp meds on this pt.  Uncontrolled.  On multiple agents.  The pt went to ED and they added amlodipine, but he is already on diltiazem. Would love to add beta blocker but his HR is always in the low 50's.  Thoughts? (Renal artery duplex is pending :)             ------

## 2012-12-11 NOTE — Telephone Encounter (Signed)
Message copied by Debbrah Alar on Tue Dec 11, 2012 10:03 AM ------      Message from: Penni Homans A      Created: Wed Dec 05, 2012  7:14 PM       Encourage DASH diet, consider sleep study, will need to use just one ccb and consider adding Maxzide, repeat renal panel after changes as we discussed, refer back to cardiology if no improvement after renal arteries are investigated.                   ----- Message -----         From: Debbrah Alar, NP         Sent: 12/05/2012  12:12 PM           To: Mosie Lukes, MD            Please see bp meds on this pt.  Uncontrolled.  On multiple agents.  The pt went to ED and they added amlodipine, but he is already on diltiazem. Would love to add beta blocker but his HR is always in the low 50's.  Thoughts? (Renal artery duplex is pending :)       ------

## 2012-12-12 ENCOUNTER — Encounter (INDEPENDENT_AMBULATORY_CARE_PROVIDER_SITE_OTHER): Payer: Medicare Other

## 2012-12-12 DIAGNOSIS — I10 Essential (primary) hypertension: Secondary | ICD-10-CM

## 2012-12-12 NOTE — Telephone Encounter (Signed)
Left message requesting pt return our call.

## 2012-12-14 ENCOUNTER — Telehealth: Payer: Self-pay | Admitting: Family

## 2012-12-14 MED ORDER — FUROSEMIDE 40 MG PO TABS: 40.0000 mg | ORAL_TABLET | Freq: Two times a day (BID) | ORAL | Status: DC

## 2012-12-14 NOTE — Telephone Encounter (Signed)
Refill- furosemide 40mg  tab. Take one tablet by mouth twice daily. Qty 60 last fill 4.28.14

## 2012-12-14 NOTE — Telephone Encounter (Signed)
Normal renal artery doppler.  Pt aware.

## 2012-12-14 NOTE — Telephone Encounter (Signed)
Refill sent, #60 x 2 refills.

## 2012-12-14 NOTE — Telephone Encounter (Signed)
BP 150/85.  HR 48-52.  Pt reports that he is currently taking carvedilol 25mg  only once daily.  He continues to take clonidine tid.  Recommended that he increase carvedilol to BID, try to discontinue clonidine.  Pt instructed to keep close eye on BP over the weekend.  If BP becomes very high off of clonidine, ok to resume if HR>55. Asked pt to call us Monday and let us know how BP and HR are doing.  He also reports some mild back pain. Advised pt that if this worsens he should arrange a follow up appointment next week.

## 2012-12-17 ENCOUNTER — Emergency Department (HOSPITAL_BASED_OUTPATIENT_CLINIC_OR_DEPARTMENT_OTHER): Payer: Medicare Other

## 2012-12-17 ENCOUNTER — Emergency Department (HOSPITAL_BASED_OUTPATIENT_CLINIC_OR_DEPARTMENT_OTHER)
Admission: EM | Admit: 2012-12-17 | Discharge: 2012-12-17 | Disposition: A | Discharge status: Home / Self Care | Payer: Medicare Other | Attending: Emergency Medicine | Admitting: Emergency Medicine

## 2012-12-17 DIAGNOSIS — Z8739 Personal history of other diseases of the musculoskeletal system and connective tissue: Secondary | ICD-10-CM | POA: Insufficient documentation

## 2012-12-17 DIAGNOSIS — Z7901 Long term (current) use of anticoagulants: Secondary | ICD-10-CM | POA: Diagnosis not present

## 2012-12-17 DIAGNOSIS — Z860100 Personal history of colon polyps, unspecified: Secondary | ICD-10-CM | POA: Insufficient documentation

## 2012-12-17 DIAGNOSIS — I10 Essential (primary) hypertension: Secondary | ICD-10-CM | POA: Diagnosis not present

## 2012-12-17 DIAGNOSIS — Z87442 Personal history of urinary calculi: Secondary | ICD-10-CM | POA: Insufficient documentation

## 2012-12-17 DIAGNOSIS — I4891 Unspecified atrial fibrillation: Secondary | ICD-10-CM | POA: Diagnosis not present

## 2012-12-17 DIAGNOSIS — M129 Arthropathy, unspecified: Secondary | ICD-10-CM | POA: Insufficient documentation

## 2012-12-17 DIAGNOSIS — Z79899 Other long term (current) drug therapy: Secondary | ICD-10-CM | POA: Insufficient documentation

## 2012-12-17 DIAGNOSIS — R509 Fever, unspecified: Secondary | ICD-10-CM | POA: Diagnosis not present

## 2012-12-17 DIAGNOSIS — R1011 Right upper quadrant pain: Secondary | ICD-10-CM | POA: Diagnosis not present

## 2012-12-17 DIAGNOSIS — R110 Nausea: Secondary | ICD-10-CM | POA: Insufficient documentation

## 2012-12-17 DIAGNOSIS — R109 Unspecified abdominal pain: Secondary | ICD-10-CM | POA: Diagnosis not present

## 2012-12-17 LAB — COMPREHENSIVE METABOLIC PANEL
ALT: 22 U/L (ref 0–53)
AST: 31 U/L (ref 0–37)
Albumin: 3.8 g/dL (ref 3.5–5.2)
Alkaline Phosphatase: 147 U/L — ABNORMAL HIGH (ref 39–117)
BUN: 27 mg/dL — ABNORMAL HIGH (ref 6–23)
Chloride: 103 mEq/L (ref 96–112)
Potassium: 3.2 mEq/L — ABNORMAL LOW (ref 3.5–5.1)
Sodium: 141 mEq/L (ref 135–145)
Total Bilirubin: 0.3 mg/dL (ref 0.3–1.2)

## 2012-12-17 LAB — CBC WITH DIFFERENTIAL/PLATELET
Basophils Absolute: 0 10*3/uL (ref 0.0–0.1)
Basophils Relative: 0 % (ref 0–1)
Hemoglobin: 12 g/dL — ABNORMAL LOW (ref 13.0–17.0)
MCHC: 34.1 g/dL (ref 30.0–36.0)
Monocytes Relative: 7 % (ref 3–12)
Neutro Abs: 4.5 10*3/uL (ref 1.7–7.7)
Neutrophils Relative %: 68 % (ref 43–77)
Platelets: 196 10*3/uL (ref 150–400)
RBC: 3.8 MIL/uL — ABNORMAL LOW (ref 4.22–5.81)

## 2012-12-17 LAB — URINE MICROSCOPIC-ADD ON

## 2012-12-17 LAB — URINALYSIS, ROUTINE W REFLEX MICROSCOPIC
Bilirubin Urine: NEGATIVE
Glucose, UA: NEGATIVE mg/dL
Hgb urine dipstick: NEGATIVE
Ketones, ur: NEGATIVE mg/dL
Specific Gravity, Urine: 1.014 (ref 1.005–1.030)
pH: 7 (ref 5.0–8.0)

## 2012-12-17 LAB — PROTIME-INR: INR: 2.71 — ABNORMAL HIGH (ref 0.00–1.49)

## 2012-12-17 MED ORDER — OXYCODONE-ACETAMINOPHEN 5-325 MG PO TABS: 1.0000 | ORAL_TABLET | Freq: Four times a day (QID) | ORAL | Status: DC | PRN

## 2012-12-17 MED ORDER — ONDANSETRON HCL 4 MG/2ML IJ SOLN: 4.0000 mg | Freq: Once | INTRAMUSCULAR | Status: DC

## 2012-12-17 MED ORDER — ONDANSETRON HCL 4 MG PO TABS: 4.0000 mg | ORAL_TABLET | Freq: Three times a day (TID) | ORAL | Status: DC | PRN

## 2012-12-17 MED ORDER — SODIUM CHLORIDE 0.9 % IV BOLUS (SEPSIS)
1000.0000 mL | Freq: Once | INTRAVENOUS | Status: AC
  Administered 2012-12-17: 1000 mL via INTRAVENOUS

## 2012-12-17 MED ORDER — PANTOPRAZOLE SODIUM 40 MG PO TBEC: 40.0000 mg | DELAYED_RELEASE_TABLET | Freq: Every day | ORAL | Status: DC

## 2012-12-17 MED ORDER — SODIUM CHLORIDE 0.9 % IV BOLUS (SEPSIS): 1000.0000 mL | Freq: Once | INTRAVENOUS | Status: DC

## 2012-12-17 NOTE — ED Notes (Signed)
Pt amb to room 5 with slow steady gait using cane, in nad. Pt reports abd pain off and on x 3 months to his right lower abd. When asked to point to the pain, pt indicates his right lower rib area. Pt states he has been seen here x 3 in the last 3 months, and he saw Lenna Sciara o'sullivan last Tuesday and had spiral ct for r/o kidney stone. Pt states "it was normal". Denies any vomiting, last bm this am.

## 2012-12-17 NOTE — ED Provider Notes (Signed)
History     CSN: [id number]  Arrival date & time 12/17/12  0756   First MD Initiated Contact with Patient 12/17/12 [phone]      Chief Complaint  Patient presents with  . Abdominal Pain  . Nausea    (Consider location/radiation/quality/duration/timing/severity/associated sxs/prior treatment) Patient is a 72 y.o. male presenting with abdominal pain.  Abdominal Pain Associated symptoms include abdominal pain.   Pt with history of HTN has recently had multiple ED and PCP visits for medications changes to get better control also had a renal US done last week as part of his HTN workup. He states during the procedure the sonographer was pushing hard on his RUQ. He reports since that time he has had moderate to severe aching pain in RUQ associated with nausea but no vomiting. Pain radiates into his back at times.  No diarrhea or constipation. No blood in stool or melena. No dysuria or hematuria. He had subjective fever at home. He consulted the Internet and is concerned he has appendicitis.   Past Medical History  Diagnosis Date  . Nephrolithiasis   . Arthritis     rheumatoid  . Colon polyp 2000  . Atrial fibrillation   . Unspecified essential hypertension   . Gout     Past Surgical History  Procedure Laterality Date  . Spine surgery      x 2  . Joint replacement      Total L-Hip replacement, Right Knee 10/20/09  . Cholecystectomy  1994    Family History  Problem Relation Age of Onset  . Hypertension Mother   . Arthritis Mother     ?RA  . Hypertension Father     History  Substance Use Topics  . Smoking status: Never Smoker   . Smokeless tobacco: Never Used  . Alcohol Use: Yes     Comment: occasional      Review of Systems  Gastrointestinal: Positive for abdominal pain.   All other systems reviewed and are negative except as noted in HPI.   Allergies  Review of patient's allergies indicates no known allergies.  Home Medications   Current Outpatient Rx  Name   Route  Sig  Dispense  Refill  . allopurinol (ZYLOPRIM) 100 MG tablet      Take 2 tablets by mouth daily.   60 tablet   2   . amLODipine (NORVASC) 10 MG tablet   Oral   Take 1 tablet (10 mg total) by mouth daily.   30 tablet   0   . carvedilol (COREG) 25 MG tablet   Oral   Take 25 mg by mouth 2 (two) times daily with a meal.          . colchicine (COLCRYS) 0.6 MG tablet      Take 2 tablets by mouth AT START OF GOUT FLARE AND 1 TABLET 3 HOURS LATER. MAX 3 TABLETS PER GOUT ATTACK.   20 tablet   3   . DIOVAN 320 MG tablet      TAKE 1 TABLET BY MOUTH EVERY DAY   30 tablet   3   . fluticasone (FLONASE) 50 MCG/ACT nasal spray   Nasal   Place 2 sprays into the nose daily.   16 g   6   . furosemide (LASIX) 40 MG tablet   Oral   Take 1 tablet (40 mg total) by mouth 2 (two) times daily.   60 tablet   2   . hydrALAZINE (APRESOLINE) 50 MG tablet  Oral   Take 1 tablet (50 mg total) by mouth 3 (three) times daily.   90 tablet   3   . hydroxychloroquine (PLAQUENIL) 200 MG tablet   Oral   Take 1 tablet by mouth Twice daily.         . methocarbamol (ROBAXIN) 500 MG tablet   Oral   Take 500 mg by mouth 4 (four) times daily as needed.           . potassium chloride SA (K-DUR,KLOR-CON) 20 MEQ tablet   Oral   Take 1 tablet (20 mEq total) by mouth daily.   5 tablet   0   . sildenafil (VIAGRA) 100 MG tablet   Oral   Take 100 mg by mouth daily as needed.           . traMADol (ULTRAM) 50 MG tablet   Oral   Take 50 mg by mouth Twice daily.         . valACYclovir (VALTREX) 500 MG tablet   Oral   Take 500 mg by mouth 2 (two) times daily. Take for 3 days at start of herpes flare         . warfarin (COUMADIN) 7.5 MG tablet      Take as directed by coumadin clinic   40 tablet   3     Temp(Src) 97.4 F (36.3 C) (Oral)  Resp 18  Ht 6\' 3"  (1.905 m)  Wt 285 lb (129.275 kg)  BMI 35.62 kg/m2  SpO2 100%  Physical Exam  Nursing note and vitals  reviewed. Constitutional: He is oriented to person, place, and time. He appears well-developed and well-nourished.  HENT:  Head: Normocephalic and atraumatic.  Eyes: EOM are normal. Pupils are equal, round, and reactive to light.  Neck: Normal range of motion. Neck supple.  Cardiovascular: Normal rate, normal heart sounds and intact distal pulses.   Pulmonary/Chest: Effort normal and breath sounds normal.  Abdominal: Bowel sounds are normal. He exhibits no distension. There is tenderness (RUQ and epigastrium). There is no rebound and no guarding.  Musculoskeletal: Normal range of motion. He exhibits no edema and no tenderness.  Neurological: He is alert and oriented to person, place, and time. He has normal strength. No cranial nerve deficit or sensory deficit.  Skin: Skin is warm and dry. No rash noted.  Psychiatric: He has a normal mood and affect.    ED Course  Procedures (including critical care time)  Labs Reviewed  CBC WITH DIFFERENTIAL - Abnormal; Notable for the following:    RBC 3.80 (*)    Hemoglobin 12.0 (*)    HCT 35.2 (*)    All other components within normal limits  COMPREHENSIVE METABOLIC PANEL - Abnormal; Notable for the following:    Potassium 3.2 (*)    Glucose, Bld 130 (*)    BUN 27 (*)    Creatinine, Ser 1.40 (*)    Alkaline Phosphatase 147 (*)    GFR calc non Af Amer 50 (*)    GFR calc Af Amer 58 (*)    All other components within normal limits  URINALYSIS, ROUTINE W REFLEX MICROSCOPIC - Abnormal; Notable for the following:    Protein, ur >300 (*)    All other components within normal limits  PROTIME-INR - Abnormal; Notable for the following:    Prothrombin Time 27.4 (*)    INR 2.71 (*)    All other components within normal limits  URINE MICROSCOPIC-ADD ON - Abnormal; Notable for the  following:    Casts HYALINE CASTS (*)    All other components within normal limits  LIPASE, BLOOD   Ct Abdomen Pelvis Wo Contrast  12/17/2012   *RADIOLOGY REPORT*   Clinical Data: Right-sided flank pain, history of blood thinner use  CT ABDOMEN AND PELVIS WITHOUT CONTRAST  Technique:  Multidetector CT imaging of the abdomen and pelvis was performed following the standard protocol without intravenous contrast.  Comparison: 04/05/2008  Findings: The lung bases demonstrate no evidence of focal infiltrate.  A small right-sided pleural effusion is noted.  The gallbladder has been surgically removed.  The liver and spleen are unremarkable.  The adrenal glands are within normal limits. Kidneys again demonstrates some cystic lesions.  Some of these demonstrate increased density but they are stable from prior exam. The bladder is well distended.  Prostate calcifications are noted.  There is mild fluid noted along the anterior aspect of Gerota's fascia and anterior to the retroperitoneum extending into the pelvis.  No significant free fluid is noted within the pelvis. Degenerative changes of the lumbar spine are seen.  Postsurgical changes are noted in the proximal left femur.  No acute bony abnormality is noted.  IMPRESSION: Increased fluid in the region of the anterior aspect of Gerota's fascia extending inferiorly towards the pelvis.  No definitive fluid collection is identified.  This could represent very mild ascites.  Chronic changes in the kidneys bilaterally.  Tiny right-sided pleural effusion.  No other focal abnormality is seen.  Critical Value/emergent results were called by telephone at the time of interpretation on 12/17/2012 at 1043 hours to Dr. Karle Starch, who verbally acknowledged these results.   Original Report Authenticated By: Inez Catalina, M.D.     1. Abdominal pain       MDM  No peritoneal signs or focal RLQ tenderness. Tenderness is mostly over the RUQ and epigastrium, will check liver panel and lipase. He is s/p cholecystectomy  11:38 AM Labs and imaging reviewed. BMP shows slight elevation of Creatinine above baseline hence IV contrast held, CT images  reviewed. Unclear etiology of fluid and also unclear if it is associated with his epigastric pain. Abd remains benign. No fever or leukocytosis to suggest an infectious process. UA is normal. Will begin PPI for possible PUD/gastritis, pain and nausea meds as needed and close ED or PCP follow up in 2-3 days if not improving.       Charles B. Karle Starch, MD 12/17/12 [phone]

## 2012-12-18 ENCOUNTER — Telehealth: Payer: Self-pay | Admitting: Family

## 2012-12-18 ENCOUNTER — Ambulatory Visit (INDEPENDENT_AMBULATORY_CARE_PROVIDER_SITE_OTHER): Payer: Medicare Other | Admitting: Family

## 2012-12-18 ENCOUNTER — Encounter: Payer: Self-pay | Admitting: Family

## 2012-12-18 VITALS — BP 140/86 | HR 43 | Temp 97.4°F | Resp 18 | Ht 72.0 in | Wt 300.1 lb

## 2012-12-18 DIAGNOSIS — I10 Essential (primary) hypertension: Secondary | ICD-10-CM

## 2012-12-18 DIAGNOSIS — J309 Allergic rhinitis, unspecified: Secondary | ICD-10-CM | POA: Diagnosis not present

## 2012-12-18 DIAGNOSIS — R109 Unspecified abdominal pain: Secondary | ICD-10-CM

## 2012-12-18 LAB — CBC WITH DIFFERENTIAL/PLATELET
Basophils Absolute: 0 10*3/uL (ref 0.0–0.1)
Basophils Relative: 0 % (ref 0–1)
Eosinophils Absolute: 0.1 10*3/uL (ref 0.0–0.7)
MCH: 30.1 pg (ref 26.0–34.0)
MCHC: 33.8 g/dL (ref 30.0–36.0)
Monocytes Absolute: 0.6 10*3/uL (ref 0.1–1.0)
Neutro Abs: 6.7 10*3/uL (ref 1.7–7.7)
Neutrophils Relative %: 76 % (ref 43–77)
RDW: 15 % (ref 11.5–15.5)

## 2012-12-18 MED ORDER — FLUTICASONE PROPIONATE 50 MCG/ACT NA SUSP: 2.0000 | Freq: Every day | NASAL | Status: DC

## 2012-12-18 MED ORDER — POTASSIUM CHLORIDE CRYS ER 20 MEQ PO TBCR: 20.0000 meq | EXTENDED_RELEASE_TABLET | Freq: Every day | ORAL | Status: DC

## 2012-12-18 NOTE — Assessment & Plan Note (Addendum)
BP looks good, but HR is low.  Recommended that he increase hydralazine from bid to tid and decrease coreg from 25mg  bid to 12.5mg  bid. I have asked the pt to contact me with his BP and hr readings at home and send them to me in Mychart.  Will also ask him to send me his daily weights.  His weight is up significantly but clinically he does not appear volume overloaded, continue furosemide.

## 2012-12-18 NOTE — Patient Instructions (Addendum)
Please complete your lab work prior to leaving. Cut carvedilol 25mg  tabs in half and take 1/2 tab twice daily. Increase hydralazine to 50mg   Three time daily. Send me your heart rate and blood pressure readings in mychart. Follow up in 1 month.

## 2012-12-18 NOTE — Assessment & Plan Note (Signed)
Clinically improved. Likely musculoskeletal, however the note of abdominal fluid collection on CT along with chronic anticoagulation raises possibility of a retroperitoneal bleed. Will repeat CBC to ensure that this is stable.  INR yesterday was 2. 7.

## 2012-12-18 NOTE — Telephone Encounter (Signed)
Please call pt in AM and let him know that I would like him to check daily weight as well as HR and BP at home for next 3 days.  Email them to me via mychart if he is able to set up. Otherwise he can leave message with info.  Make sure his is taking his lasix bid.

## 2012-12-18 NOTE — Assessment & Plan Note (Signed)
Recommended that he resume flonase and claritin.  Call if symptoms worsen or of not improved in 1 week.

## 2012-12-18 NOTE — Progress Notes (Signed)
Subjective:    Patient ID: Bruce Cole, male    DOB: [birth year], 72 y.o.   MRN: [name]  HPI  Bruce Cole is a 71 yr old male who presents today for ED follow up.  ED records are reviewed:  Pt was seen yesterday with chief complaint of abdominal pain.  Note was made on exam of ? Fluid in anterior aspect of gerota's fascia ? Mild ascites. Of note, he has gained 11 pounds since his last visit on 5/19.  Reports that his abdominal pain is improved today.    HTN-  Last visit his clonidine and cardizem where discontinued.  He was titrated up on carvedilol and placed on amlodipine.   Nasal congestion- Needs some flonase.    Denies fever.  Nasal discharge is clear.    Review of Systems    see HPI  Past Medical History  Diagnosis Date  . Nephrolithiasis   . Arthritis     rheumatoid  . Colon polyp 2000  . Atrial fibrillation   . Unspecified essential hypertension   . Gout     History   Social History  . Marital Status: Single    Spouse Name: N/A    Number of Children: 3  . Years of Education: N/A   Occupational History  .     Social History Main Topics  . Smoking status: Never Smoker   . Smokeless tobacco: Never Used  . Alcohol Use: Yes     Comment: occasional  . Drug Use: No  . Sexually Active: Not on file   Other Topics Concern  . 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 Spine surgery      x 2  . Joint replacement      Total L-Hip replacement, Right Knee 10/20/09  . Cholecystectomy  1994    Family History  Problem Relation Age of Onset  . Hypertension Mother   . Arthritis Mother     ?RA  . Hypertension Father     No Known Allergies  Current Outpatient Prescriptions on File Prior to Visit  Medication Sig Dispense Refill  . allopurinol (ZYLOPRIM) 100 MG tablet Take 2 tablets by mouth daily.  60 tablet  2  . amLODipine (NORVASC) 10 MG tablet Take 1  tablet (10 mg total) by mouth daily.  30 tablet  0  . carvedilol (COREG) 25 MG tablet Take 25 mg by mouth 2 (two) times daily with a meal.       . colchicine (COLCRYS) 0.6 MG tablet Take 2 tablets by mouth AT START OF GOUT FLARE AND 1 TABLET 3 HOURS LATER. MAX 3 TABLETS PER GOUT ATTACK.  20 tablet  3  . DIOVAN 320 MG tablet TAKE 1 TABLET BY MOUTH EVERY DAY  30 tablet  3  . fluticasone (FLONASE) 50 MCG/ACT nasal spray Place 2 sprays into the nose daily.  16 g  6  . furosemide (LASIX) 40 MG tablet Take 1 tablet (40 mg total) by mouth 2 (two) times daily.  60 tablet  2  . hydrALAZINE (APRESOLINE) 50 MG tablet Take 1 tablet (50 mg total) by mouth 3 (three) times daily.  90 tablet  3  . hydroxychloroquine (PLAQUENIL) 200 MG tablet Take 1 tablet by mouth Twice daily.      . methocarbamol (ROBAXIN) 500 MG tablet Take 500 mg by mouth 4 (four) times  daily as needed.        . pantoprazole (PROTONIX) 40 MG tablet Take 1 tablet (40 mg total) by mouth daily.  30 tablet  0  . sildenafil (VIAGRA) 100 MG tablet Take 100 mg by mouth daily as needed.        . traMADol (ULTRAM) 50 MG tablet Take 50 mg by mouth Twice daily.      . valACYclovir (VALTREX) 500 MG tablet Take 500 mg by mouth 2 (two) times daily. Take for 3 days at start of herpes flare      . warfarin (COUMADIN) 7.5 MG tablet Take as directed by coumadin clinic  40 tablet  3   No current facility-administered medications on file prior to visit.    BP 140/86  Pulse 43  Temp(Src) 97.4 F (36.3 C) (Oral)  Resp 18  Ht 6' (1.829 m)  Wt 300 lb 1.3 oz (136.115 kg)  BMI 40.69 kg/m2  SpO2 96%    Objective:   Physical Exam  Constitutional: He is oriented to person, place, and time. He appears well-developed and well-nourished. No distress.  HENT:  Head: Normocephalic and atraumatic.  Cardiovascular: Normal rate and regular rhythm.   No murmur heard. Pulmonary/Chest: Effort normal and breath sounds normal. No respiratory distress. He has no  wheezes. He has no rales. He exhibits no tenderness.  Abdominal: Soft. Bowel sounds are normal. He exhibits no distension.  Mild right sided abdominal tenderness noted.   Musculoskeletal: He exhibits no edema.  Neurological: He is alert and oriented to person, place, and time.  Psychiatric: He has a normal mood and affect. His behavior is normal. Judgment and thought content normal.  No lower extremity edema is noted.         Assessment & Plan:

## 2012-12-19 NOTE — Telephone Encounter (Signed)
Notified pt and he voices understanding. 

## 2012-12-20 ENCOUNTER — Encounter: Payer: Self-pay | Admitting: Family

## 2012-12-25 ENCOUNTER — Telehealth: Payer: Self-pay | Admitting: Family

## 2012-12-25 MED ORDER — VALSARTAN 320 MG PO TABS: ORAL_TABLET | ORAL | Status: DC

## 2012-12-25 NOTE — Telephone Encounter (Signed)
Refill- diovan 320mg  tablets. Take one tablet by mouth every day. Qty 30

## 2012-12-25 NOTE — Telephone Encounter (Signed)
Rx sent 

## 2013-01-01 ENCOUNTER — Ambulatory Visit (INDEPENDENT_AMBULATORY_CARE_PROVIDER_SITE_OTHER): Payer: Medicare Other | Admitting: Pharmacist

## 2013-01-01 DIAGNOSIS — Z7901 Long term (current) use of anticoagulants: Secondary | ICD-10-CM | POA: Diagnosis not present

## 2013-01-01 DIAGNOSIS — I4891 Unspecified atrial fibrillation: Secondary | ICD-10-CM | POA: Diagnosis not present

## 2013-01-01 LAB — POCT INR: INR: 2.4

## 2013-01-08 ENCOUNTER — Ambulatory Visit (INDEPENDENT_AMBULATORY_CARE_PROVIDER_SITE_OTHER): Payer: Medicare Other | Admitting: Family

## 2013-01-08 ENCOUNTER — Encounter: Payer: Self-pay | Admitting: Family

## 2013-01-08 VITALS — BP 146/80 | HR 46 | Temp 98.1°F | Resp 16 | Ht 72.0 in | Wt 305.0 lb

## 2013-01-08 DIAGNOSIS — R0602 Shortness of breath: Secondary | ICD-10-CM

## 2013-01-08 DIAGNOSIS — I10 Essential (primary) hypertension: Secondary | ICD-10-CM

## 2013-01-08 DIAGNOSIS — M109 Gout, unspecified: Secondary | ICD-10-CM | POA: Diagnosis not present

## 2013-01-08 DIAGNOSIS — Z09 Encounter for follow-up examination after completed treatment for conditions other than malignant neoplasm: Secondary | ICD-10-CM | POA: Diagnosis not present

## 2013-01-08 DIAGNOSIS — M069 Rheumatoid arthritis, unspecified: Secondary | ICD-10-CM | POA: Diagnosis not present

## 2013-01-08 DIAGNOSIS — E876 Hypokalemia: Secondary | ICD-10-CM

## 2013-01-08 DIAGNOSIS — M25559 Pain in unspecified hip: Secondary | ICD-10-CM | POA: Diagnosis not present

## 2013-01-08 MED ORDER — ALBUTEROL SULFATE HFA 108 (90 BASE) MCG/ACT IN AERS: 2.0000 | INHALATION_SPRAY | Freq: Four times a day (QID) | RESPIRATORY_TRACT | Status: DC | PRN

## 2013-01-08 NOTE — Assessment & Plan Note (Signed)
BP Readings from Last 3 Encounters:  01/08/13 146/80  12/18/12 140/86  12/17/12 142/89   BP looks pretty good.  Continue current meds. Rechecked HR manually in the room was 52 bpm which is normal for him.  I think his congestion may be allergy asthma related more than volume overload, and I have given him an rx for albuterol MDI to try.  Will also check BNP, but I suspect it will be ok. I have asked him to continue to monitor his BP at home and to call if he has any further weight gain.  In regards to the GI upset, I am not sure what to make of this. Will monitor for now- I have asked pt to call if symptoms worsen or if symptoms do not improve.  He verbalizes understanding.

## 2013-01-08 NOTE — Progress Notes (Signed)
Subjective:    Patient ID: Bruce Cole, male    DOB: [birth year], 72 y.o.   MRN: [name]  HPI  Mr.  Zachman is a 72 yr old male who presents today for follow up.  1) HTN- he has gained 5 pounds since his last visit.    2) Nausea- reports "stomach stays unsettled."  Started since he started new BP med.  Reports + congestion at night in the sinus and the chest.  He continues furosemide 40mg  BID.  Denies CP.  He does have some HS palpitations.  Notes some mild DOE when he starts golf, but then after a few holes he feels fine.  He continues to work out at [employer]- rides bike, treadmill, situps.  X 1 hr several days a week. Feels that it is getting better.   3) RA-reports that he is retaining fluds in the ankle.  Worsened after recent long trip 2 months ago.  Compression socks generally help.       Review of Systems See HPI  Past Medical History  Diagnosis Date  . Nephrolithiasis   . Arthritis     rheumatoid  . Colon polyp 2000  . Atrial fibrillation   . Unspecified essential hypertension   . Gout     History   Social History  . Marital Status: Single    Spouse Name: N/A    Number of Children: 3  . Years of Education: N/A   Occupational History  .     Social History Main Topics  . Smoking status: Never Smoker   . Smokeless tobacco: Never Used  . Alcohol Use: Yes     Comment: occasional  . Drug Use: No  . Sexually Active: Not on file   Other Topics Concern  . 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 Spine surgery      x 2  . Joint replacement      Total L-Hip replacement, Right Knee 10/20/09  . Cholecystectomy  1994    Family History  Problem Relation Age of Onset  . Hypertension Mother   . Arthritis Mother     ?RA  . Hypertension Father     No Known Allergies  Current Outpatient Prescriptions on File Prior to Visit  Medication Sig Dispense  Refill  . allopurinol (ZYLOPRIM) 100 MG tablet Take 2 tablets by mouth daily.  60 tablet  2  . amLODipine (NORVASC) 10 MG tablet Take 1 tablet (10 mg total) by mouth daily.  30 tablet  0  . carvedilol (COREG) 25 MG tablet Take 12.5 mg by mouth 2 (two) times daily with a meal.       . colchicine (COLCRYS) 0.6 MG tablet Take 2 tablets by mouth AT START OF GOUT FLARE AND 1 TABLET 3 HOURS LATER. MAX 3 TABLETS PER GOUT ATTACK.  20 tablet  3  . fluticasone (FLONASE) 50 MCG/ACT nasal spray Place 2 sprays into the nose daily.  16 g  6  . furosemide (LASIX) 40 MG tablet Take 1 tablet (40 mg total) by mouth 2 (two) times daily.  60 tablet  2  . hydrALAZINE (APRESOLINE) 50 MG tablet Take 1 tablet (50 mg total) by mouth 3 (three) times daily.  90 tablet  3  . hydroxychloroquine (PLAQUENIL) 200 MG tablet Take 1 tablet by mouth Twice daily.      [name]  methocarbamol (ROBAXIN) 500 MG tablet Take 500 mg by mouth 4 (four) times daily as needed.        . pantoprazole (PROTONIX) 40 MG tablet Take 1 tablet (40 mg total) by mouth daily.  30 tablet  0  . potassium chloride SA (K-DUR,KLOR-CON) 20 MEQ tablet Take 1 tablet (20 mEq total) by mouth daily.  30 tablet  2  . sildenafil (VIAGRA) 100 MG tablet Take 100 mg by mouth daily as needed.        . traMADol (ULTRAM) 50 MG tablet Take 50 mg by mouth Twice daily.      . valACYclovir (VALTREX) 500 MG tablet Take 500 mg by mouth 2 (two) times daily. Take for 3 days at start of herpes flare      . valsartan (DIOVAN) 320 MG tablet TAKE 1 TABLET BY MOUTH EVERY DAY  30 tablet  5  . warfarin (COUMADIN) 7.5 MG tablet Take as directed by coumadin clinic  40 tablet  3   No current facility-administered medications on file prior to visit.    BP 146/80  Pulse 46  Temp(Src) 98.1 F (36.7 C) (Oral)  Resp 16  Ht 6' (1.829 m)  Wt 305 lb (138.347 kg)  BMI 41.36 kg/m2  SpO2 98%       Objective:   Physical Exam  Constitutional: He is oriented to person, place, and time. He  appears well-developed and well-nourished. No distress.  HENT:  Head: Normocephalic and atraumatic.  Cardiovascular: Normal rate and regular rhythm.   No murmur heard. Pulmonary/Chest: Effort normal and breath sounds normal. No respiratory distress. He has no wheezes. He has no rales. He exhibits no tenderness.  Musculoskeletal:  Trace bilateral LE edema.   Neurological: He is alert and oriented to person, place, and time.  Psychiatric: He has a normal mood and affect. His behavior is normal. Judgment and thought content normal.          Assessment & Plan:

## 2013-01-08 NOTE — Patient Instructions (Addendum)
Weigh yourself daily. Call if worsening sob, swelling. Follow up in 2 months.

## 2013-01-08 NOTE — Assessment & Plan Note (Signed)
Pt will meet with dr. Estanislado Pandy this morning for follow up.

## 2013-01-10 DIAGNOSIS — E876 Hypokalemia: Secondary | ICD-10-CM | POA: Diagnosis not present

## 2013-01-10 DIAGNOSIS — R0602 Shortness of breath: Secondary | ICD-10-CM | POA: Diagnosis not present

## 2013-01-10 LAB — BASIC METABOLIC PANEL
CO2: 29 mEq/L (ref 19–32)
Calcium: 9.7 mg/dL (ref 8.4–10.5)
Creat: 1.5 mg/dL — ABNORMAL HIGH (ref 0.50–1.35)
Glucose, Bld: 116 mg/dL — ABNORMAL HIGH (ref 70–99)
Sodium: 139 mEq/L (ref 135–145)

## 2013-01-10 LAB — BRAIN NATRIURETIC PEPTIDE: Brain Natriuretic Peptide: 101.5 pg/mL — ABNORMAL HIGH (ref 0.0–100.0)

## 2013-01-14 ENCOUNTER — Telehealth: Payer: Self-pay | Admitting: Family

## 2013-01-14 NOTE — Telephone Encounter (Signed)
Refill- allopurinol 100mg  tablets. Take two tablets by mouth every day. Qty 60 last fill 6.3.14  Refill- amlodipine besylate 10mg  tablets. Take one tablet by mouth once daily. Qty 30 last fill 5.25.14

## 2013-01-15 MED ORDER — AMLODIPINE BESYLATE 10 MG PO TABS: 10.0000 mg | ORAL_TABLET | Freq: Every day | ORAL | Status: DC

## 2013-01-15 MED ORDER — ALLOPURINOL 100 MG PO TABS: ORAL_TABLET | ORAL | Status: DC

## 2013-01-15 NOTE — Telephone Encounter (Signed)
Rx request to pharmacy/SLS  

## 2013-01-16 ENCOUNTER — Ambulatory Visit: Payer: Medicare Other | Admitting: Family

## 2013-01-29 ENCOUNTER — Encounter (HOSPITAL_BASED_OUTPATIENT_CLINIC_OR_DEPARTMENT_OTHER): Payer: Self-pay | Admitting: *Deleted

## 2013-01-29 ENCOUNTER — Emergency Department (HOSPITAL_BASED_OUTPATIENT_CLINIC_OR_DEPARTMENT_OTHER)
Admission: EM | Admit: 2013-01-29 | Discharge: 2013-01-29 | Disposition: A | Discharge status: Home / Self Care | Payer: Medicare Other | Attending: Emergency Medicine | Admitting: Emergency Medicine

## 2013-01-29 ENCOUNTER — Telehealth: Payer: Self-pay | Admitting: Family

## 2013-01-29 ENCOUNTER — Emergency Department (HOSPITAL_BASED_OUTPATIENT_CLINIC_OR_DEPARTMENT_OTHER): Payer: Medicare Other

## 2013-01-29 DIAGNOSIS — R0989 Other specified symptoms and signs involving the circulatory and respiratory systems: Secondary | ICD-10-CM | POA: Diagnosis not present

## 2013-01-29 DIAGNOSIS — I10 Essential (primary) hypertension: Secondary | ICD-10-CM | POA: Insufficient documentation

## 2013-01-29 DIAGNOSIS — R0982 Postnasal drip: Secondary | ICD-10-CM | POA: Insufficient documentation

## 2013-01-29 DIAGNOSIS — I4891 Unspecified atrial fibrillation: Secondary | ICD-10-CM | POA: Insufficient documentation

## 2013-01-29 DIAGNOSIS — Z87442 Personal history of urinary calculi: Secondary | ICD-10-CM | POA: Diagnosis not present

## 2013-01-29 DIAGNOSIS — R0981 Nasal congestion: Secondary | ICD-10-CM

## 2013-01-29 DIAGNOSIS — I5021 Acute systolic (congestive) heart failure: Secondary | ICD-10-CM | POA: Insufficient documentation

## 2013-01-29 DIAGNOSIS — Z860100 Personal history of colon polyps, unspecified: Secondary | ICD-10-CM | POA: Insufficient documentation

## 2013-01-29 DIAGNOSIS — M109 Gout, unspecified: Secondary | ICD-10-CM | POA: Diagnosis not present

## 2013-01-29 DIAGNOSIS — J3489 Other specified disorders of nose and nasal sinuses: Secondary | ICD-10-CM | POA: Diagnosis not present

## 2013-01-29 DIAGNOSIS — Z79899 Other long term (current) drug therapy: Secondary | ICD-10-CM | POA: Diagnosis not present

## 2013-01-29 DIAGNOSIS — Z8739 Personal history of other diseases of the musculoskeletal system and connective tissue: Secondary | ICD-10-CM | POA: Insufficient documentation

## 2013-01-29 LAB — COMPREHENSIVE METABOLIC PANEL
AST: 36 U/L (ref 0–37)
Albumin: 3.8 g/dL (ref 3.5–5.2)
Chloride: 101 mEq/L (ref 96–112)
Creatinine, Ser: 1.6 mg/dL — ABNORMAL HIGH (ref 0.50–1.35)
Total Bilirubin: 0.5 mg/dL (ref 0.3–1.2)
Total Protein: 7.2 g/dL (ref 6.0–8.3)

## 2013-01-29 LAB — CBC WITH DIFFERENTIAL/PLATELET
Basophils Absolute: 0 10*3/uL (ref 0.0–0.1)
Basophils Relative: 0 % (ref 0–1)
Eosinophils Absolute: 0.2 10*3/uL (ref 0.0–0.7)
MCH: 31.3 pg (ref 26.0–34.0)
MCHC: 33.3 g/dL (ref 30.0–36.0)
Monocytes Absolute: 0.6 10*3/uL (ref 0.1–1.0)
Neutro Abs: 6.6 10*3/uL (ref 1.7–7.7)
Neutrophils Relative %: 76 % (ref 43–77)
RDW: 15.8 % — ABNORMAL HIGH (ref 11.5–15.5)

## 2013-01-29 LAB — PRO B NATRIURETIC PEPTIDE: Pro B Natriuretic peptide (BNP): 390.7 pg/mL — ABNORMAL HIGH (ref 0–125)

## 2013-01-29 LAB — PROTIME-INR
INR: 2.98 — ABNORMAL HIGH (ref 0.00–1.49)
Prothrombin Time: 29.9 seconds — ABNORMAL HIGH (ref 11.6–15.2)

## 2013-01-29 LAB — TROPONIN I: Troponin I: <0.3 ng/mL (ref <0.30)

## 2013-01-29 MED ORDER — AZITHROMYCIN 250 MG PO TABS: 250.0000 mg | ORAL_TABLET | Freq: Every day | ORAL | Status: DC

## 2013-01-29 MED ORDER — CETIRIZINE HCL 1 MG/ML PO SYRP: 2.5000 mg | ORAL_SOLUTION | Freq: Two times a day (BID) | ORAL | Status: DC

## 2013-01-29 MED ORDER — CETIRIZINE HCL 5 MG/5ML PO SYRP
5.0000 mg | ORAL_SOLUTION | Freq: Once | ORAL | Status: AC
  Administered 2013-01-29: 5 mg via ORAL
  Filled 2013-01-29: qty 5

## 2013-01-29 MED ORDER — SALINE SPRAY 0.65 % NA SOLN: 1.0000 | NASAL | Status: DC | PRN

## 2013-01-29 NOTE — Telephone Encounter (Signed)
Please call pt and arrange ED follow up visit.

## 2013-01-29 NOTE — Telephone Encounter (Signed)
Called patient and scheduled ED appointment for 02/05/13 at 2:30 pm.

## 2013-01-29 NOTE — ED Notes (Signed)
MD at bedside. 

## 2013-01-29 NOTE — ED Provider Notes (Addendum)
History    CSN: [id number] Arrival date & time 01/29/13  0458  First MD Initiated Contact with Patient 01/29/13 [phone]     Chief Complaint  Patient presents with  . Shortness of Breath   (Consider location/radiation/quality/duration/timing/severity/associated sxs/prior Treatment) HPI Comments: Pt comes in with cc of congestion. Pt has hx of HTN, possible mild CHF (no wcho in our system), and renal stones. He reports having 2 months worth of nasal congestion. His sx are worse at night, and when he is sleeping. He wakes up congested. No orthopnea, PND. Pt has no chest pain, cough, and no exertional dyspnea that is new. Pt has seen pcp and ED for similar complains in the past and had a negative workup. No hx of cardiac workup. Pt  denies any allergies, denies weight gain.  The history is provided by the patient.   Past Medical History  Diagnosis Date  . Nephrolithiasis   . Arthritis     rheumatoid  . Colon polyp 2000  . Atrial fibrillation   . Unspecified essential hypertension   . Gout    Past Surgical History  Procedure Laterality Date  . Spine surgery      x 2  . Joint replacement      Total L-Hip replacement, Right Knee 10/20/09  . Cholecystectomy  1994   Family History  Problem Relation Age of Onset  . Hypertension Mother   . Arthritis Mother     ?RA  . Hypertension Father    History  Substance Use Topics  . Smoking status: Never Smoker   . Smokeless tobacco: Never Used  . Alcohol Use: Yes     Comment: occasional    Review of Systems  Constitutional: Negative for activity change and appetite change.  HENT: Positive for congestion and postnasal drip. Negative for sinus pressure.   Respiratory: Negative for cough and shortness of breath.   Cardiovascular: Negative for chest pain.  Gastrointestinal: Negative for abdominal pain.  Genitourinary: Negative for dysuria.    Allergies  Review of patient's allergies indicates no known allergies.  Home Medications    Current Outpatient Rx  Name  Route  Sig  Dispense  Refill  . albuterol (PROVENTIL HFA;VENTOLIN HFA) 108 (90 BASE) MCG/ACT inhaler   Inhalation   Inhale 2 puffs into the lungs every 6 (six) hours as needed for wheezing.   1 Inhaler   2   . allopurinol (ZYLOPRIM) 100 MG tablet      Take 2 tablets by mouth daily.   60 tablet   2   . amLODipine (NORVASC) 10 MG tablet   Oral   Take 1 tablet (10 mg total) by mouth daily.   30 tablet   2   . carvedilol (COREG) 25 MG tablet   Oral   Take 12.5 mg by mouth 2 (two) times daily with a meal.          . colchicine (COLCRYS) 0.6 MG tablet      Take 2 tablets by mouth AT START OF GOUT FLARE AND 1 TABLET 3 HOURS LATER. MAX 3 TABLETS PER GOUT ATTACK.   20 tablet   3   . fluticasone (FLONASE) 50 MCG/ACT nasal spray   Nasal   Place 2 sprays into the nose daily.   16 g   6   . furosemide (LASIX) 40 MG tablet   Oral   Take 1 tablet (40 mg total) by mouth 2 (two) times daily.   60 tablet   2   .  hydrALAZINE (APRESOLINE) 50 MG tablet   Oral   Take 1 tablet (50 mg total) by mouth 3 (three) times daily.   90 tablet   3   . hydroxychloroquine (PLAQUENIL) 200 MG tablet   Oral   Take 1 tablet by mouth Twice daily.         . pantoprazole (PROTONIX) 40 MG tablet   Oral   Take 1 tablet (40 mg total) by mouth daily.   30 tablet   0   . potassium chloride SA (K-DUR,KLOR-CON) 20 MEQ tablet   Oral   Take 1 tablet (20 mEq total) by mouth daily.   30 tablet   2   . valACYclovir (VALTREX) 500 MG tablet   Oral   Take 500 mg by mouth 2 (two) times daily. Take for 3 days at start of herpes flare         . valsartan (DIOVAN) 320 MG tablet      TAKE 1 TABLET BY MOUTH EVERY DAY   30 tablet   5   . warfarin (COUMADIN) 7.5 MG tablet      Take as directed by coumadin clinic   40 tablet   3   . methocarbamol (ROBAXIN) 500 MG tablet   Oral   Take 500 mg by mouth 4 (four) times daily as needed.           . sildenafil  (VIAGRA) 100 MG tablet   Oral   Take 100 mg by mouth daily as needed.           . traMADol (ULTRAM) 50 MG tablet   Oral   Take 50 mg by mouth Twice daily.          BP 148/85  Pulse 48  Temp(Src) 97.8 F (36.6 C) (Oral)  Resp 20  SpO2 100% Physical Exam  Nursing note and vitals reviewed. Constitutional: He is oriented to person, place, and time. He appears well-developed.  HENT:  Head: Normocephalic and atraumatic.  Right Ear: External ear normal.  Left Ear: External ear normal.  Mouth/Throat: Oropharynx is clear and moist. No oropharyngeal exudate.  Eyes: Conjunctivae and EOM are normal. Pupils are equal, round, and reactive to light.  Neck: Normal range of motion. Neck supple. No JVD present.  Cardiovascular: Normal rate and regular rhythm.   No murmur heard. Pulmonary/Chest: Effort normal and breath sounds normal. No respiratory distress. He has no wheezes. He has no rales. He exhibits no tenderness.  Abdominal: Soft. Bowel sounds are normal. He exhibits no distension. There is no tenderness. There is no rebound and no guarding.  Neurological: He is alert and oriented to person, place, and time.  Skin: Skin is warm.    ED Course  Procedures (including critical care time) Labs Reviewed  CBC WITH DIFFERENTIAL - Abnormal; Notable for the following:    RBC 3.77 (*)    Hemoglobin 11.8 (*)    HCT 35.4 (*)    RDW 15.8 (*)    All other components within normal limits  COMPREHENSIVE METABOLIC PANEL  TROPONIN I  PROTIME-INR  PRO B NATRIURETIC PEPTIDE   No results found. No diagnosis found.  MDM  Pt comes in with cc of dib. Pt has hx of HTN, ? CHF on lasix. Comes in with congestion, worse with laying flat - no PND like sx, and no orthopnea. Lung exam and cardiac exam are WNL. No signs of pna, chf. No concerns for PE based on hx and the fact that patient is  on coumadin. Pt possibly has chronic sinusitis...  We will get basic screening labs, Xray.  No echo in our  system. Pt will be asked to see his PCP soon, and we will recommend an echo if he hasnt had one.   Varney Biles, MD 01/29/13 0705   Date: 01/29/2013  Rate: 40  Rhythm: junctional  QRS Axis: left  Intervals: normal  ST/T Wave abnormalities: normal  Conduction Disutrbances:none  Narrative Interpretation:   Old EKG Reviewed: unchanged  Pt is bradycardic. He is asymptomatic. No near syncope, syncope.  Will discharge. HR at 50 on one of the ED visits. Likely due to dilt. requested him to check his pulse 3-4 times a day and record it on a log, and to come to the Ed immediately if he has a syncopal episode.     Varney Biles, MD 01/29/13 [phone]

## 2013-01-29 NOTE — ED Notes (Signed)
Pt c/o SOB for several months, has been seen numerous times for the same with no definitive diagnosis. Pt denies fever or cough.

## 2013-02-04 ENCOUNTER — Telehealth: Payer: Self-pay | Admitting: Family

## 2013-02-04 MED ORDER — CARVEDILOL 25 MG PO TABS: 12.5000 mg | ORAL_TABLET | Freq: Two times a day (BID) | ORAL | Status: DC

## 2013-02-04 NOTE — Telephone Encounter (Signed)
Refill-carvedilol 25mg  tablets. Take one tablet by mouth twice daily with a meal. Qty 60 last fill 5.22.14

## 2013-02-05 ENCOUNTER — Encounter: Payer: Self-pay | Admitting: Family

## 2013-02-05 ENCOUNTER — Ambulatory Visit (INDEPENDENT_AMBULATORY_CARE_PROVIDER_SITE_OTHER): Payer: Medicare Other | Admitting: Family

## 2013-02-05 VITALS — BP 140/86 | HR 47 | Temp 97.8°F | Resp 20 | Wt 309.0 lb

## 2013-02-05 DIAGNOSIS — N184 Chronic kidney disease, stage 4 (severe): Secondary | ICD-10-CM | POA: Insufficient documentation

## 2013-02-05 DIAGNOSIS — D649 Anemia, unspecified: Secondary | ICD-10-CM | POA: Diagnosis not present

## 2013-02-05 DIAGNOSIS — I509 Heart failure, unspecified: Secondary | ICD-10-CM | POA: Diagnosis not present

## 2013-02-05 DIAGNOSIS — N289 Disorder of kidney and ureter, unspecified: Secondary | ICD-10-CM | POA: Diagnosis not present

## 2013-02-05 NOTE — Patient Instructions (Addendum)
Increase lasix to 80mg  in the morning and 40mg  in the evening.  Call me on Friday with your weight. You will be contacted about your echocardiogram. Follow up in 1 week.

## 2013-02-05 NOTE — Progress Notes (Signed)
Subjective:    Patient ID: Bruce Cole, male    DOB: 22-Sep-1940, 72 y.o.   MRN: [name]  HPI  [name] is a 72 yr old male who presents today for follow up.  He was seen in the ED on 7/15 and was noted to have mild vascular congestion. He was noted to be mildly anemia, creatinine is up slightly from baseline.  He reports + wheezing and SOB.  Reports DOE.  Reports that he was able to ride the stationary bike x 1 hour.  He has orthopnea- 4 pillows.  He also reports new LE edema. The pt does report using aleve.    Review of Systems See HPI Past Medical History  Diagnosis Date  . Nephrolithiasis   . Arthritis     rheumatoid  . Colon polyp 2000  . Atrial fibrillation   . Unspecified essential hypertension   . Gout     History   Social History  . Marital Status: Single    Spouse Name: N/A    Number of Children: 3  . Years of Education: N/A   Occupational History  .     Social History Main Topics  . Smoking status: Never Smoker   . Smokeless tobacco: Never Used  . Alcohol Use: Yes     Comment: occasional  . Drug Use: No  . Sexually Active: Not on file   Other Topics Concern  . 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 Spine surgery      x 2  . Joint replacement      Total L-Hip replacement, Right Knee 10/20/09  . Cholecystectomy  1994    Family History  Problem Relation Age of Onset  . Hypertension Mother   . Arthritis Mother     ?RA  . Hypertension Father     No Known Allergies  Current Outpatient Prescriptions on File Prior to Visit  Medication Sig Dispense Refill  . albuterol (PROVENTIL HFA;VENTOLIN HFA) 108 (90 BASE) MCG/ACT inhaler Inhale 2 puffs into the lungs every 6 (six) hours as needed for wheezing.  1 Inhaler  2  . allopurinol (ZYLOPRIM) 100 MG tablet Take 2 tablets by mouth daily.  60 tablet  2  . amLODipine (NORVASC) 10 MG tablet Take 1  tablet (10 mg total) by mouth daily.  30 tablet  2  . azithromycin (ZITHROMAX) 250 MG tablet Take 1 tablet (250 mg total) by mouth daily. Take first 2 tablets together, then 1 every day until finished.  6 tablet  0  . carvedilol (COREG) 25 MG tablet Take 0.5 tablets (12.5 mg total) by mouth 2 (two) times daily with a meal.  30 tablet  1  . cetirizine (ZYRTEC) 1 MG/ML syrup Take 2.5 mLs (2.5 mg total) by mouth 2 (two) times daily. Max use 5 days  59 mL  0  . colchicine (COLCRYS) 0.6 MG tablet Take 2 tablets by mouth AT START OF GOUT FLARE AND 1 TABLET 3 HOURS LATER. MAX 3 TABLETS PER GOUT ATTACK.  20 tablet  3  . fluticasone (FLONASE) 50 MCG/ACT nasal spray Place 2 sprays into the nose daily.  16 g  6  . furosemide (LASIX) 40 MG tablet Take 1 tablet (40 mg total) by mouth 2 (two) times daily.  60 tablet  2  . hydrALAZINE (APRESOLINE) 50 MG tablet Take 1 tablet (50  mg total) by mouth 3 (three) times daily.  90 tablet  3  . hydroxychloroquine (PLAQUENIL) 200 MG tablet Take 1 tablet by mouth Twice daily.      . methocarbamol (ROBAXIN) 500 MG tablet Take 500 mg by mouth 4 (four) times daily as needed.        . pantoprazole (PROTONIX) 40 MG tablet Take 1 tablet (40 mg total) by mouth daily.  30 tablet  0  . potassium chloride SA (K-DUR,KLOR-CON) 20 MEQ tablet Take 1 tablet (20 mEq total) by mouth daily.  30 tablet  2  . sildenafil (VIAGRA) 100 MG tablet Take 100 mg by mouth daily as needed.        . sodium chloride (OCEAN) 0.65 % SOLN nasal spray Place 1 spray into the nose as needed for congestion.  15 mL  0  . traMADol (ULTRAM) 50 MG tablet Take 50 mg by mouth Twice daily.      . valACYclovir (VALTREX) 500 MG tablet Take 500 mg by mouth 2 (two) times daily. Take for 3 days at start of herpes flare      . valsartan (DIOVAN) 320 MG tablet TAKE 1 TABLET BY MOUTH EVERY DAY  30 tablet  5  . warfarin (COUMADIN) 7.5 MG tablet Take as directed by coumadin clinic  40 tablet  3   No current  facility-administered medications on file prior to visit.    BP 140/86  Pulse 47  Temp(Src) 97.8 F (36.6 C) (Oral)  Resp 20  Wt 309 lb (140.161 kg)  BMI 41.9 kg/m2  SpO2 96%       Objective:   Physical Exam  Constitutional: He is oriented to person, place, and time. He appears well-nourished. No distress.  HENT:  Head: Normocephalic and atraumatic.  Cardiovascular: Normal rate and regular rhythm.   No murmur heard. Pulmonary/Chest: Effort normal and breath sounds normal. No respiratory distress. He has no wheezes. He has no rales. He exhibits no tenderness.  Musculoskeletal:  1-2+ bilateral LE edema  Neurological: He is alert and oriented to person, place, and time.  Psychiatric: He has a normal mood and affect. His behavior is normal. Judgment and thought content normal.          Assessment & Plan:

## 2013-02-05 NOTE — Assessment & Plan Note (Signed)
Suspect secondary to NSAIDS +/- CHF exacerbation. Pt advised to stop NSAIDS.  Repeat bmet next visit.  If creatinine remains elevated plan referral to nephrology.

## 2013-02-05 NOTE — Assessment & Plan Note (Signed)
?   NSAID induced gastritis with GI loss in setting of chronic anticoagulation.  Stop NSAIDS.  Plan IFOB next visit.

## 2013-02-05 NOTE — Assessment & Plan Note (Addendum)
Will increase lasix to 80mg  AM 40mg  PM. Pt instructed to call me in 3 days with home weight.

## 2013-02-13 ENCOUNTER — Encounter: Payer: Self-pay | Admitting: Family

## 2013-02-13 ENCOUNTER — Ambulatory Visit: Payer: Medicare Other | Admitting: Family Medicine

## 2013-02-13 ENCOUNTER — Ambulatory Visit (INDEPENDENT_AMBULATORY_CARE_PROVIDER_SITE_OTHER): Payer: Medicare Other | Admitting: Family

## 2013-02-13 VITALS — BP 130/64 | HR 49 | Temp 98.3°F | Resp 18 | Ht 72.0 in | Wt 309.0 lb

## 2013-02-13 DIAGNOSIS — D649 Anemia, unspecified: Secondary | ICD-10-CM

## 2013-02-13 DIAGNOSIS — I4891 Unspecified atrial fibrillation: Secondary | ICD-10-CM

## 2013-02-13 DIAGNOSIS — I5042 Chronic combined systolic (congestive) and diastolic (congestive) heart failure: Secondary | ICD-10-CM

## 2013-02-13 DIAGNOSIS — I10 Essential (primary) hypertension: Secondary | ICD-10-CM | POA: Diagnosis not present

## 2013-02-13 DIAGNOSIS — I509 Heart failure, unspecified: Secondary | ICD-10-CM | POA: Diagnosis not present

## 2013-02-13 LAB — BASIC METABOLIC PANEL
BUN: 25 mg/dL — ABNORMAL HIGH (ref 6–23)
Calcium: 9.4 mg/dL (ref 8.4–10.5)
Creat: 1.77 mg/dL — ABNORMAL HIGH (ref 0.50–1.35)
Glucose, Bld: 98 mg/dL (ref 70–99)
Potassium: 3.9 mEq/L (ref 3.5–5.3)

## 2013-02-13 LAB — PROTIME-INR: Prothrombin Time: 23.5 seconds — ABNORMAL HIGH (ref 11.6–15.2)

## 2013-02-13 LAB — IRON: Iron: 55 ug/dL (ref 42–165)

## 2013-02-13 NOTE — Patient Instructions (Addendum)
Please complete lab work prior to leaving. Complete stool kit and return.  Follow up in 4 weeks. You will be contacted about you referral to Dr. Lovena Le and echo.

## 2013-02-13 NOTE — Progress Notes (Signed)
Subjective:    Patient ID: Bruce Cole, male    DOB: [birth year], 72 y.o.   MRN: [name]  HPI  Bruce Cole is a 72 yr old male who presents today for follow up of his CHF.  Last visit his lasix was increased from 40mg  bid to 80 mg bid.  BNP was slighlty elevated at 390. Chart review reveals that his weight is up 24 pounds since his visit on 6/2.  Weight today is 309 and is unchanged from last week.  Reports that he ate before came today and home readings were 306.  He reports that his sob is improving and his LE edema is improving.     Review of Systems See HPI  Past Medical History  Diagnosis Date  . Nephrolithiasis   . Arthritis     rheumatoid  . Colon polyp 2000  . Atrial fibrillation   . Unspecified essential hypertension   . Gout     History   Social History  . Marital Status: Single    Spouse Name: N/A    Number of Children: 3  . Years of Education: N/A   Occupational History  .     Social History Main Topics  . Smoking status: Never Smoker   . Smokeless tobacco: Never Used  . Alcohol Use: Yes     Comment: occasional  . Drug Use: No  . Sexually Active: Not on file   Other Topics Concern  . 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 Spine surgery      x 2  . Joint replacement      Total L-Hip replacement, Right Knee 10/20/09  . Cholecystectomy  1994    Family History  Problem Relation Age of Onset  . Hypertension Mother   . Arthritis Mother     ?RA  . Hypertension Father     No Known Allergies  Current Outpatient Prescriptions on File Prior to Visit  Medication Sig Dispense Refill  . albuterol (PROVENTIL HFA;VENTOLIN HFA) 108 (90 BASE) MCG/ACT inhaler Inhale 2 puffs into the lungs every 6 (six) hours as needed for wheezing.  1 Inhaler  2  . allopurinol (ZYLOPRIM) 100 MG tablet Take 2 tablets by mouth daily.  60 tablet  2  . amLODipine  (NORVASC) 10 MG tablet Take 1 tablet (10 mg total) by mouth daily.  30 tablet  2  . azithromycin (ZITHROMAX) 250 MG tablet Take 1 tablet (250 mg total) by mouth daily. Take first 2 tablets together, then 1 every day until finished.  6 tablet  0  . carvedilol (COREG) 25 MG tablet Take 0.5 tablets (12.5 mg total) by mouth 2 (two) times daily with a meal.  30 tablet  1  . cetirizine (ZYRTEC) 1 MG/ML syrup Take 2.5 mLs (2.5 mg total) by mouth 2 (two) times daily. Max use 5 days  59 mL  0  . colchicine (COLCRYS) 0.6 MG tablet Take 2 tablets by mouth AT START OF GOUT FLARE AND 1 TABLET 3 HOURS LATER. MAX 3 TABLETS PER GOUT ATTACK.  20 tablet  3  . fluticasone (FLONASE) 50 MCG/ACT nasal spray Place 2 sprays into the nose daily.  16 g  6  . furosemide (LASIX) 40 MG tablet Take 1 tablet (40 mg total) by mouth 2 (two) times daily.  60 tablet  2  . hydrALAZINE (APRESOLINE) 50  MG tablet Take 1 tablet (50 mg total) by mouth 3 (three) times daily.  90 tablet  3  . hydroxychloroquine (PLAQUENIL) 200 MG tablet Take 1 tablet by mouth Twice daily.      . methocarbamol (ROBAXIN) 500 MG tablet Take 500 mg by mouth 4 (four) times daily as needed.        . pantoprazole (PROTONIX) 40 MG tablet Take 1 tablet (40 mg total) by mouth daily.  30 tablet  0  . potassium chloride SA (K-DUR,KLOR-CON) 20 MEQ tablet Take 1 tablet (20 mEq total) by mouth daily.  30 tablet  2  . sildenafil (VIAGRA) 100 MG tablet Take 100 mg by mouth daily as needed.        . sodium chloride (OCEAN) 0.65 % SOLN nasal spray Place 1 spray into the nose as needed for congestion.  15 mL  0  . traMADol (ULTRAM) 50 MG tablet Take 50 mg by mouth Twice daily.      . valACYclovir (VALTREX) 500 MG tablet Take 500 mg by mouth 2 (two) times daily. Take for 3 days at start of herpes flare      . valsartan (DIOVAN) 320 MG tablet TAKE 1 TABLET BY MOUTH EVERY DAY  30 tablet  5  . warfarin (COUMADIN) 7.5 MG tablet Take as directed by coumadin clinic  40 tablet  3    No current facility-administered medications on file prior to visit.    BP 130/64  Pulse 49  Temp(Src) 98.3 F (36.8 C) (Oral)  Resp 18  Ht 6' (1.829 m)  Wt 309 lb (140.161 kg)  BMI 41.9 kg/m2  SpO2 99%       Objective:   Physical Exam  Constitutional: He is oriented to person, place, and time. He appears well-developed and well-nourished.  HENT:  Head: Normocephalic and atraumatic.  Cardiovascular: Normal rate and regular rhythm.   No murmur heard. Pulmonary/Chest: Effort normal and breath sounds normal. No respiratory distress. He has no wheezes. He has no rales. He exhibits no tenderness.  Musculoskeletal:  2+ bilateral LE edema is noted  Lymphadenopathy:    He has no cervical adenopathy.  Neurological: He is alert and oriented to person, place, and time.  Skin: Skin is warm.  Psychiatric: He has a normal mood and affect. His behavior is normal. Judgment and thought content normal.          Assessment & Plan:

## 2013-02-15 ENCOUNTER — Other Ambulatory Visit (HOSPITAL_COMMUNITY): Payer: Self-pay | Admitting: Family

## 2013-02-15 ENCOUNTER — Telehealth: Payer: Self-pay | Admitting: Family

## 2013-02-15 DIAGNOSIS — N289 Disorder of kidney and ureter, unspecified: Secondary | ICD-10-CM

## 2013-02-15 DIAGNOSIS — I5043 Acute on chronic combined systolic (congestive) and diastolic (congestive) heart failure: Secondary | ICD-10-CM

## 2013-02-15 NOTE — Assessment & Plan Note (Signed)
Clinically improving since increase in lasix dose.  Continue lasix at current dose, obtain 2D echo, check BMET, continue to monitor daily weights at home.  Refer to cardiology for further evaluation.

## 2013-02-15 NOTE — Telephone Encounter (Signed)
Please call pt and let him know kidney function still slightly impaired.  I would like him to meet with kidney doctor for consultation. Pended below.

## 2013-02-15 NOTE — Telephone Encounter (Signed)
Notified pt and he is agreeable to proceed with referral. 

## 2013-02-17 ENCOUNTER — Other Ambulatory Visit: Payer: Self-pay | Admitting: Family

## 2013-02-18 ENCOUNTER — Encounter: Payer: Self-pay | Admitting: *Deleted

## 2013-02-18 NOTE — Telephone Encounter (Signed)
Rx request to pharmacy/SLS  

## 2013-02-19 ENCOUNTER — Ambulatory Visit (INDEPENDENT_AMBULATORY_CARE_PROVIDER_SITE_OTHER): Payer: Medicare Other | Admitting: Internal Medicine

## 2013-02-19 ENCOUNTER — Ambulatory Visit (HOSPITAL_COMMUNITY): Payer: Medicare Other | Attending: Cardiology | Admitting: Radiology

## 2013-02-19 ENCOUNTER — Encounter: Payer: Self-pay | Admitting: *Deleted

## 2013-02-19 ENCOUNTER — Encounter: Payer: Self-pay | Admitting: Internal Medicine

## 2013-02-19 VITALS — BP 128/72 | HR 46 | Ht 75.0 in | Wt 310.0 lb

## 2013-02-19 DIAGNOSIS — I509 Heart failure, unspecified: Secondary | ICD-10-CM

## 2013-02-19 DIAGNOSIS — R0609 Other forms of dyspnea: Secondary | ICD-10-CM | POA: Insufficient documentation

## 2013-02-19 DIAGNOSIS — I4891 Unspecified atrial fibrillation: Secondary | ICD-10-CM

## 2013-02-19 DIAGNOSIS — I079 Rheumatic tricuspid valve disease, unspecified: Secondary | ICD-10-CM | POA: Diagnosis not present

## 2013-02-19 DIAGNOSIS — R0602 Shortness of breath: Secondary | ICD-10-CM

## 2013-02-19 DIAGNOSIS — I379 Nonrheumatic pulmonary valve disorder, unspecified: Secondary | ICD-10-CM | POA: Insufficient documentation

## 2013-02-19 DIAGNOSIS — I10 Essential (primary) hypertension: Secondary | ICD-10-CM

## 2013-02-19 DIAGNOSIS — R609 Edema, unspecified: Secondary | ICD-10-CM | POA: Diagnosis not present

## 2013-02-19 DIAGNOSIS — R0989 Other specified symptoms and signs involving the circulatory and respiratory systems: Secondary | ICD-10-CM | POA: Insufficient documentation

## 2013-02-19 DIAGNOSIS — I5043 Acute on chronic combined systolic (congestive) and diastolic (congestive) heart failure: Secondary | ICD-10-CM

## 2013-02-19 LAB — CBC WITH DIFFERENTIAL/PLATELET
Basophils Absolute: 0 10*3/uL (ref 0.0–0.1)
Eosinophils Relative: 1.7 % (ref 0.0–5.0)
HCT: 36.1 % — ABNORMAL LOW (ref 39.0–52.0)
Hemoglobin: 11.9 g/dL — ABNORMAL LOW (ref 13.0–17.0)
Lymphocytes Relative: 19.3 % (ref 12.0–46.0)
Lymphs Abs: 1.5 10*3/uL (ref 0.7–4.0)
Monocytes Relative: 7.5 % (ref 3.0–12.0)
Neutro Abs: 5.7 10*3/uL (ref 1.4–7.7)
RBC: 3.81 Mil/uL — ABNORMAL LOW (ref 4.22–5.81)
RDW: 16.5 % — ABNORMAL HIGH (ref 11.5–14.6)
WBC: 8 10*3/uL (ref 4.5–10.5)

## 2013-02-19 LAB — BASIC METABOLIC PANEL
Calcium: 9.4 mg/dL (ref 8.4–10.5)
GFR: 57.76 mL/min — ABNORMAL LOW (ref >60.00)
Potassium: 3.5 mEq/L (ref 3.5–5.1)
Sodium: 140 mEq/L (ref 135–145)

## 2013-02-19 MED ORDER — POTASSIUM CHLORIDE CRYS ER 20 MEQ PO TBCR: 20.0000 meq | EXTENDED_RELEASE_TABLET | Freq: Three times a day (TID) | ORAL | Status: DC

## 2013-02-19 MED ORDER — METOLAZONE 2.5 MG PO TABS: ORAL_TABLET | ORAL | Status: DC

## 2013-02-19 NOTE — Assessment & Plan Note (Signed)
The patient's ventricular rate is well controlled. He will continue his current medical therapy except we will plan to hold his Coumadin for his catheterization.

## 2013-02-19 NOTE — Assessment & Plan Note (Signed)
The patient has developed congestive heart failure, predominantly right-sided. Preliminary echo suggest right ventricular dysfunction, and pulmonary hypertension. I've recommended that the patient undergo left and right heart catheterization to better evaluate the etiology of his congestive heart failure. He is currently taking 120 mg of Lasix daily and has persistent peripheral edema. He does not abuse sodium. No change in medications today except I've asked the patient to increase his Lasix to 80 mg twice daily. He will hold his anticoagulation prior to his heart catheterization.

## 2013-02-19 NOTE — Assessment & Plan Note (Signed)
His blood pressure has been well-controlled. No change in medical therapy.

## 2013-02-19 NOTE — Patient Instructions (Addendum)
Your physician has requested that you have a cardiac catheterization. Cardiac catheterization is used to diagnose and/or treat various heart conditions. Doctors may recommend this procedure for a number of different reasons. The most common reason is to evaluate chest pain. Chest pain can be a symptom of coronary artery disease (CAD), and cardiac catheterization can show whether plaque is narrowing or blocking your heart's arteries. This procedure is also used to evaluate the valves, as well as measure the blood flow and oxygen levels in different parts of your heart. For further information please visit [email or web address]. Please follow instruction sheet, as given.  Your physician has recommended you make the following change in your medication:  1) Increase Furosemide to 80mg  twice daily 2) Start Metolazone 2.5mg  daily 70min prior to the morning fluid pill dose 3) Increase Potassium 48mew three times a day   See instruction sheet for cath

## 2013-02-19 NOTE — Progress Notes (Signed)
Echocardiogram performed.  

## 2013-02-19 NOTE — Progress Notes (Signed)
HPI Bruce Cole returns today for followup. He is a very pleasant 72 year old man with a history of chronic atrial fibrillation, hypertension, and arthritis. He has been on chronic Coumadin therapy. Several months ago, he noted peripheral edema and shortness of breath. He has been started on diuretic therapy with improvement. He still has class 2-3 heart failure symptoms. The patient denies anginal symptoms. He denies dietary indiscretion. He has undergone 2-D echo in our office today and the preliminary report suggests that he has developed pulmonary hypertension, and right ventricular dysfunction. He denies fevers or chills. No other symptoms to speak of except generalized fatigue and poor appetite. His weight is up approximately 30 pounds in the last year. He notes that his Coumadin levels have been therapeutic. No Known Allergies   Current Outpatient Prescriptions  Medication Sig Dispense Refill  . albuterol (PROVENTIL HFA;VENTOLIN HFA) 108 (90 BASE) MCG/ACT inhaler Inhale 2 puffs into the lungs every 6 (six) hours as needed for wheezing.  1 Inhaler  2  . allopurinol (ZYLOPRIM) 100 MG tablet Take 2 tablets by mouth daily.  60 tablet  2  . amLODipine (NORVASC) 10 MG tablet Take 1 tablet (10 mg total) by mouth daily.  30 tablet  2  . azithromycin (ZITHROMAX) 250 MG tablet Take 1 tablet (250 mg total) by mouth daily. Take first 2 tablets together, then 1 every day until finished.  6 tablet  0  . carvedilol (COREG) 25 MG tablet Take 0.5 tablets (12.5 mg total) by mouth 2 (two) times daily with a meal.  30 tablet  1  . cetirizine (ZYRTEC) 1 MG/ML syrup Take 2.5 mLs (2.5 mg total) by mouth 2 (two) times daily. Max use 5 days  59 mL  0  . COLCRYS 0.6 MG tablet TAKE 2 TABLETS BY MOUTH AT START OF GOUT FLARE, AND 1 TABLET 3 HOURS LATER. MAX OF 3 TABLETS PER GOUT ATTACK  30 tablet  3  . fluticasone (FLONASE) 50 MCG/ACT nasal spray Place 2 sprays into the nose daily.  16 g  6  . furosemide (LASIX) 40 MG  tablet TAKE 1 TABLET BY MOUTH TWICE DAILY  60 tablet  3  . hydrALAZINE (APRESOLINE) 50 MG tablet Take 1 tablet (50 mg total) by mouth 3 (three) times daily.  90 tablet  3  . hydroxychloroquine (PLAQUENIL) 200 MG tablet Take 1 tablet by mouth Twice daily.      . methocarbamol (ROBAXIN) 500 MG tablet Take 500 mg by mouth 4 (four) times daily as needed.        . pantoprazole (PROTONIX) 40 MG tablet Take 1 tablet (40 mg total) by mouth daily.  30 tablet  0  . potassium chloride SA (K-DUR,KLOR-CON) 20 MEQ tablet Take 1 tablet (20 mEq total) by mouth daily.  30 tablet  2  . sildenafil (VIAGRA) 100 MG tablet Take 100 mg by mouth daily as needed.        . sodium chloride (OCEAN) 0.65 % SOLN nasal spray Place 1 spray into the nose as needed for congestion.  15 mL  0  . traMADol (ULTRAM) 50 MG tablet Take 50 mg by mouth Twice daily.      . valACYclovir (VALTREX) 500 MG tablet Take 500 mg by mouth 2 (two) times daily as needed.      . valsartan (DIOVAN) 320 MG tablet TAKE 1 TABLET BY MOUTH EVERY DAY  30 tablet  5  . warfarin (COUMADIN) 7.5 MG tablet Take as directed by coumadin clinic  40 tablet  3   No current facility-administered medications for this visit.     Past Medical History  Diagnosis Date  . Nephrolithiasis   . Arthritis     rheumatoid  . Colon polyp 2000  . Atrial fibrillation   . Unspecified essential hypertension   . Gout     ROS:   All systems reviewed and negative except as noted in the HPI.   Past Surgical History  Procedure Laterality Date  . Spine surgery      x 2  . Joint replacement      Total L-Hip replacement, Right Knee 10/20/09  . Cholecystectomy  1994     Family History  Problem Relation Age of Onset  . Hypertension Mother   . Arthritis Mother     ?RA  . Hypertension Father      History   Social History  . Marital Status: Single    Spouse Name: N/A    Number of Children: 3  . Years of Education: N/A   Occupational History  .     Social  History Main Topics  . Smoking status: Never Smoker   . Smokeless tobacco: Never Used  . Alcohol Use: Yes     Comment: occasional  . Drug Use: No  . Sexually Active: Not on file   Other Topics Concern  . Not on file   Social History Narrative   Former [employer]     BP 128/72  Pulse 46  Ht 6\' 3"  (1.905 m)  Wt 310 lb (140.615 kg)  BMI 38.75 kg/m2  Physical Exam:  Well appearing NAD HEENT: Unremarkable Neck:  9 cm JVD, no thyromegally Back:  No CVA tenderness Lungs:  Rales in the bases bilaterally, one fourth up. HEART:  IRegular rate rhythm, grade 1/6 systolic murmurs, no rubs, no clicks Abd:  soft, positive bowel sounds, no organomegally, no rebound, no guarding Ext:  2 plus pulses, 3+ peripheral edema, no cyanosis, no clubbing Skin:  No rashes no nodules Neuro:  CN II through XII intact, motor grossly intact  Assess/Plan:

## 2013-02-20 ENCOUNTER — Encounter (HOSPITAL_COMMUNITY): Payer: Self-pay | Admitting: Pharmacy Technician

## 2013-02-22 ENCOUNTER — Encounter (HOSPITAL_COMMUNITY)
Admission: RE | Disposition: A | Discharge status: Home / Self Care | Payer: Self-pay | Source: Ambulatory Visit | Attending: Internal Medicine

## 2013-02-22 ENCOUNTER — Ambulatory Visit (HOSPITAL_COMMUNITY)
Admission: RE | Admit: 2013-02-22 | Discharge: 2013-02-22 | Disposition: A | Discharge status: Home / Self Care | Payer: Medicare Other | Source: Ambulatory Visit | Attending: Internal Medicine | Admitting: Internal Medicine

## 2013-02-22 ENCOUNTER — Telehealth: Payer: Self-pay | Admitting: Family

## 2013-02-22 ENCOUNTER — Telehealth (HOSPITAL_COMMUNITY): Payer: Self-pay | Admitting: Cardiology

## 2013-02-22 DIAGNOSIS — M069 Rheumatoid arthritis, unspecified: Secondary | ICD-10-CM | POA: Insufficient documentation

## 2013-02-22 DIAGNOSIS — I4891 Unspecified atrial fibrillation: Secondary | ICD-10-CM | POA: Diagnosis not present

## 2013-02-22 DIAGNOSIS — I509 Heart failure, unspecified: Secondary | ICD-10-CM

## 2013-02-22 DIAGNOSIS — M109 Gout, unspecified: Secondary | ICD-10-CM | POA: Insufficient documentation

## 2013-02-22 DIAGNOSIS — I2789 Other specified pulmonary heart diseases: Secondary | ICD-10-CM | POA: Diagnosis not present

## 2013-02-22 DIAGNOSIS — I279 Pulmonary heart disease, unspecified: Secondary | ICD-10-CM | POA: Diagnosis not present

## 2013-02-22 DIAGNOSIS — I10 Essential (primary) hypertension: Secondary | ICD-10-CM | POA: Diagnosis not present

## 2013-02-22 DIAGNOSIS — Z7901 Long term (current) use of anticoagulants: Secondary | ICD-10-CM | POA: Diagnosis not present

## 2013-02-22 DIAGNOSIS — Z79899 Other long term (current) drug therapy: Secondary | ICD-10-CM | POA: Insufficient documentation

## 2013-02-22 HISTORY — PX: LEFT AND RIGHT HEART CATHETERIZATION WITH CORONARY ANGIOGRAM: SHX5449

## 2013-02-22 LAB — POCT I-STAT 3, VENOUS BLOOD GAS (G3P V)
Acid-Base Excess: 1 mmol/L (ref 0.0–2.0)
Acid-Base Excess: 4 mmol/L — ABNORMAL HIGH (ref 0.0–2.0)
Bicarbonate: 26.2 mEq/L — ABNORMAL HIGH (ref 20.0–24.0)
O2 Saturation: 64 %
O2 Saturation: 70 %
TCO2: 28 mmol/L (ref 0–100)
TCO2: 30 mmol/L (ref 0–100)
pCO2, Ven: 43.8 mmHg — ABNORMAL LOW (ref 45.0–50.0)
pH, Ven: 7.386 — ABNORMAL HIGH (ref 7.250–7.300)

## 2013-02-22 LAB — POCT I-STAT 3, ART BLOOD GAS (G3+)
O2 Saturation: 96 %
TCO2: 29 mmol/L (ref 0–100)
pCO2 arterial: 41.5 mmHg (ref 35.0–45.0)

## 2013-02-22 LAB — PROTIME-INR: Prothrombin Time: 17.7 seconds — ABNORMAL HIGH (ref 11.6–15.2)

## 2013-02-22 SURGERY — LEFT AND RIGHT HEART CATHETERIZATION WITH CORONARY ANGIOGRAM
Anesthesia: LOCAL

## 2013-02-22 MED ORDER — ACETAMINOPHEN 325 MG PO TABS: 650.0000 mg | ORAL_TABLET | ORAL | Status: DC | PRN

## 2013-02-22 MED ORDER — LIDOCAINE HCL (PF) 1 % IJ SOLN
INTRAMUSCULAR | Status: AC
  Filled 2013-02-22: qty 30

## 2013-02-22 MED ORDER — SODIUM CHLORIDE 0.9 % IV SOLN: INTRAVENOUS | Status: AC

## 2013-02-22 MED ORDER — MIDAZOLAM HCL 2 MG/2ML IJ SOLN
INTRAMUSCULAR | Status: AC
  Filled 2013-02-22: qty 2

## 2013-02-22 MED ORDER — FUROSEMIDE 40 MG PO TABS: 80.0000 mg | ORAL_TABLET | Freq: Two times a day (BID) | ORAL | Status: DC

## 2013-02-22 MED ORDER — SODIUM CHLORIDE 0.9 % IJ SOLN: 3.0000 mL | Freq: Two times a day (BID) | INTRAMUSCULAR | Status: DC

## 2013-02-22 MED ORDER — OXYCODONE-ACETAMINOPHEN 5-325 MG PO TABS
ORAL_TABLET | ORAL | Status: AC
  Filled 2013-02-22: qty 2

## 2013-02-22 MED ORDER — DIAZEPAM 5 MG PO TABS
5.0000 mg | ORAL_TABLET | ORAL | Status: AC
  Administered 2013-02-22: 5 mg via ORAL

## 2013-02-22 MED ORDER — TERAZOSIN HCL 1 MG PO CAPS: 1.0000 mg | ORAL_CAPSULE | Freq: Every day | ORAL | Status: DC

## 2013-02-22 MED ORDER — OXYCODONE-ACETAMINOPHEN 5-325 MG PO TABS
1.0000 | ORAL_TABLET | ORAL | Status: DC | PRN
  Administered 2013-02-22: 2 via ORAL

## 2013-02-22 MED ORDER — SODIUM CHLORIDE 0.9 % IJ SOLN: 3.0000 mL | INTRAMUSCULAR | Status: DC | PRN

## 2013-02-22 MED ORDER — HEPARIN (PORCINE) IN NACL 2-0.9 UNIT/ML-% IJ SOLN
INTRAMUSCULAR | Status: AC
  Filled 2013-02-22: qty 1000

## 2013-02-22 MED ORDER — ASPIRIN 81 MG PO CHEW
324.0000 mg | CHEWABLE_TABLET | ORAL | Status: AC
  Administered 2013-02-22: 324 mg via ORAL

## 2013-02-22 MED ORDER — DIAZEPAM 5 MG PO TABS
ORAL_TABLET | ORAL | Status: AC
  Filled 2013-02-22: qty 1

## 2013-02-22 MED ORDER — ONDANSETRON HCL 4 MG/2ML IJ SOLN: 4.0000 mg | Freq: Four times a day (QID) | INTRAMUSCULAR | Status: DC | PRN

## 2013-02-22 MED ORDER — FENTANYL CITRATE 0.05 MG/ML IJ SOLN
INTRAMUSCULAR | Status: AC
  Filled 2013-02-22: qty 2

## 2013-02-22 MED ORDER — NITROGLYCERIN 0.2 MG/ML ON CALL CATH LAB
INTRAVENOUS | Status: AC
  Filled 2013-02-22: qty 1

## 2013-02-22 MED ORDER — ASPIRIN 81 MG PO CHEW
CHEWABLE_TABLET | ORAL | Status: AC
  Filled 2013-02-22: qty 4

## 2013-02-22 MED ORDER — SODIUM CHLORIDE 0.9 % IV SOLN
250.0000 mL | INTRAVENOUS | Status: DC | PRN
Start: 2013-02-22 — End: 2013-02-22
  Administered 2013-02-22: 1000 mL via INTRAVENOUS

## 2013-02-22 NOTE — Telephone Encounter (Signed)
Pt states Dr Lovena Le increased furosemide to 40 mg.  Take 2 tablets twice a day. Pt states his home weight is down to the 290s. Pt states he was told his heart cath appears clear per and cardiologist thinks it may be due to sleep apnea. Thinks he will be scheduled for a sleep study in a couple of weeks. Pt is requesting refill of furosemide.  Please advise.

## 2013-02-22 NOTE — H&P (View-Only) (Signed)
HPI Bruce Cole returns today for followup. He is a very pleasant 72 year old man with a history of chronic atrial fibrillation, hypertension, and arthritis. He has been on chronic Coumadin therapy. Several months ago, he noted peripheral edema and shortness of breath. He has been started on diuretic therapy with improvement. He still has class 2-3 heart failure symptoms. The patient denies anginal symptoms. He denies dietary indiscretion. He has undergone 2-D echo in our office today and the preliminary report suggests that he has developed pulmonary hypertension, and right ventricular dysfunction. He denies fevers or chills. No other symptoms to speak of except generalized fatigue and poor appetite. His weight is up approximately 30 pounds in the last year. He notes that his Coumadin levels have been therapeutic. No Known Allergies   Current Outpatient Prescriptions  Medication Sig Dispense Refill  . albuterol (PROVENTIL HFA;VENTOLIN HFA) 108 (90 BASE) MCG/ACT inhaler Inhale 2 puffs into the lungs every 6 (six) hours as needed for wheezing.  1 Inhaler  2  . allopurinol (ZYLOPRIM) 100 MG tablet Take 2 tablets by mouth daily.  60 tablet  2  . amLODipine (NORVASC) 10 MG tablet Take 1 tablet (10 mg total) by mouth daily.  30 tablet  2  . azithromycin (ZITHROMAX) 250 MG tablet Take 1 tablet (250 mg total) by mouth daily. Take first 2 tablets together, then 1 every day until finished.  6 tablet  0  . carvedilol (COREG) 25 MG tablet Take 0.5 tablets (12.5 mg total) by mouth 2 (two) times daily with a meal.  30 tablet  1  . cetirizine (ZYRTEC) 1 MG/ML syrup Take 2.5 mLs (2.5 mg total) by mouth 2 (two) times daily. Max use 5 days  59 mL  0  . COLCRYS 0.6 MG tablet TAKE 2 TABLETS BY MOUTH AT START OF GOUT FLARE, AND 1 TABLET 3 HOURS LATER. MAX OF 3 TABLETS PER GOUT ATTACK  30 tablet  3  . fluticasone (FLONASE) 50 MCG/ACT nasal spray Place 2 sprays into the nose daily.  16 g  6  . furosemide (LASIX) 40 MG  tablet TAKE 1 TABLET BY MOUTH TWICE DAILY  60 tablet  3  . hydrALAZINE (APRESOLINE) 50 MG tablet Take 1 tablet (50 mg total) by mouth 3 (three) times daily.  90 tablet  3  . hydroxychloroquine (PLAQUENIL) 200 MG tablet Take 1 tablet by mouth Twice daily.      . methocarbamol (ROBAXIN) 500 MG tablet Take 500 mg by mouth 4 (four) times daily as needed.        . pantoprazole (PROTONIX) 40 MG tablet Take 1 tablet (40 mg total) by mouth daily.  30 tablet  0  . potassium chloride SA (K-DUR,KLOR-CON) 20 MEQ tablet Take 1 tablet (20 mEq total) by mouth daily.  30 tablet  2  . sildenafil (VIAGRA) 100 MG tablet Take 100 mg by mouth daily as needed.        . sodium chloride (OCEAN) 0.65 % SOLN nasal spray Place 1 spray into the nose as needed for congestion.  15 mL  0  . traMADol (ULTRAM) 50 MG tablet Take 50 mg by mouth Twice daily.      . valACYclovir (VALTREX) 500 MG tablet Take 500 mg by mouth 2 (two) times daily as needed.      . valsartan (DIOVAN) 320 MG tablet TAKE 1 TABLET BY MOUTH EVERY DAY  30 tablet  5  . warfarin (COUMADIN) 7.5 MG tablet Take as directed by coumadin clinic  40 tablet  3   No current facility-administered medications for this visit.     Past Medical History  Diagnosis Date  . Nephrolithiasis   . Arthritis     rheumatoid  . Colon polyp 2000  . Atrial fibrillation   . Unspecified essential hypertension   . Gout     ROS:   All systems reviewed and negative except as noted in the HPI.   Past Surgical History  Procedure Laterality Date  . Spine surgery      x 2  . Joint replacement      Total L-Hip replacement, Right Knee 10/20/09  . Cholecystectomy  1994     Family History  Problem Relation Age of Onset  . Hypertension Mother   . Arthritis Mother     ?RA  . Hypertension Father      History   Social History  . Marital Status: Single    Spouse Name: N/A    Number of Children: 3  . Years of Education: N/A   Occupational History  .     Social  History Main Topics  . Smoking status: Never Smoker   . Smokeless tobacco: Never Used  . Alcohol Use: Yes     Comment: occasional  . Drug Use: No  . Sexually Active: Not on file   Other Topics Concern  . Not on file   Social History Narrative   Former [employer]     BP 128/72  Pulse 46  Ht 6\' 3"  (1.905 m)  Wt 310 lb (140.615 kg)  BMI 38.75 kg/m2  Physical Exam:  Well appearing NAD HEENT: Unremarkable Neck:  9 cm JVD, no thyromegally Back:  No CVA tenderness Lungs:  Rales in the bases bilaterally, one fourth up. HEART:  IRegular rate rhythm, grade 1/6 systolic murmurs, no rubs, no clicks Abd:  soft, positive bowel sounds, no organomegally, no rebound, no guarding Ext:  2 plus pulses, 3+ peripheral edema, no cyanosis, no clubbing Skin:  No rashes no nodules Neuro:  CN II through XII intact, motor grossly intact  Assess/Plan:

## 2013-02-22 NOTE — Interval H&P Note (Signed)
History and Physical Interval Note:  02/22/2013 8:00 AM  Bruce Cole  has presented today for surgery, with the diagnosis of chf  The various methods of treatment have been discussed with the patient and family. After consideration of risks, benefits and other options for treatment, the patient has consented to  Procedure(s): LEFT AND RIGHT HEART CATHETERIZATION WITH CORONARY ANGIOGRAM (N/A) as a surgical intervention .  The patient's history has been reviewed, patient examined, no change in status, stable for surgery.  I have reviewed the patient's chart and labs.  Questions were answered to the patient's satisfaction.     [name]

## 2013-02-22 NOTE — Telephone Encounter (Signed)
Patient states that he has questions regarding furosemide dosage. He says that he is out of this medication and needs refills sent to [facility] on [address].

## 2013-02-22 NOTE — Telephone Encounter (Signed)
Pt given next available 8/19 @ 10:00

## 2013-02-22 NOTE — Telephone Encounter (Signed)
Message copied by JEFFRIES, Sharlot Gowda on Fri Feb 22, 2013  4:36 PM ------      Message from: Scarlette Calico      Created: Fri Feb 22, 2013  9:52 AM       Needs appt in 2-3 weeks please, Linna Hoff cathed him I guess we can put him in new pt slot will be fine, thanks ------

## 2013-02-22 NOTE — Telephone Encounter (Signed)
Spoke to pt.  Advised him that per Nephrology recommendations, they would like him to stop diovan due to renal insufficiency.  Advised pt to stop diovan, start hytrin, keep upcoming follow up in on 8/25.  He will check BP at home and call if BP elevated.  Case was discussed with Dr. Charlett Blake.

## 2013-02-22 NOTE — CV Procedure (Signed)
Cardiac Cath Procedure Note  Indication: Pulmonary hypertension  Procedures performed:  1) Right heart cathererization 2) Selective coronary angiography 3) Left heart catheterization 4) Left ventriculogram  Description of procedure:     The risks and indication of the procedure were explained. Consent was signed and placed on the chart. An appropriate timeout was taken prior to the procedure. The right groin was prepped and draped in the routine sterile fashion and anesthetized with 1% local lidocaine.   A 5 FR arterial sheath was placed in the right femoral artery using a modified Seldinger technique. Standard catheters including a JL6, JR5 and angled pigtail were used. All catheter exchanges were made over a wire. A 7 FR venous sheath was placed in the right femoral vein using a modified Seldinger technique. A standard Swan-Ganz catheter was used for the procedure.   Complications:  None apparent  Findings:  RA = 28 with prominent v-waves RV = 67/21/28 PA = 70/23 (42) PCW = 29 Fick cardiac output/index = 7.5/2.8 PVR = 1.8 Woods SVR = 610 FA sat = 96% PA sat = 64%, 70% No RV LV interaction Near equalization of RV, LV and RA diastolic pressures  Ao Pressure: 112/66/85 LV Pressure: 120/22/27 There was no signficant gradient across the aortic valve on pullback.  Left main: Normal  LAD: Moderate-sized vessel with several diagonal branches. Very mild plaque in the midsection. Otherwise normal  LCX: Dominant vessel with large-branching OM1 and PDA. Normal   RCA: Nondominant. Normal  LV-gram done in the RAO projection: Ejection fraction = 45% very mild global HK  Assessment: 1. Essentially normal coronary arteries. 2. Mildly reduced LV function with EF 45% 3. Mild-moderate pulmonary venous HTN with normal PVR 4. Markedly elevated RA pressure with prominent v-waves suggestive of significant TR 5. Elevated biventricular diastolic pressure with near equalization or R and L  sided diastolic pressures suggestive of restrictive physiology 6. Restrictive physiology  Plan/Discussion:  His coronaries are normal. He does have mild to moderate pulmonary venous HTN with normal PVR. Findings suggestive of restrictive physiology and significant TR.   Will need continued diuresis - would suggest changing lasix to demadex 40 bid. Will review echo to evaluate TV more closely. Refer for sleep study. Check SPEP, UPEP to exclude amyloid.  Will have him f/u in HF clinic in 12 weeks to facilitate the work-up and these changes.   Daniel Bensimhon,MD 9:06 AM

## 2013-02-24 ENCOUNTER — Telehealth: Payer: Self-pay | Admitting: Family

## 2013-02-24 NOTE — Telephone Encounter (Signed)
Message copied by Debbrah Alar on Sun Feb 24, 2013  9:48 AM ------      Message from: Jiles Garter E      Created: Thu Feb 21, 2013  8:27 AM      Regarding: Referral to [facility]       Call from [facility], they are requesting you hold medication    Diovan ,  Pt will be scheduled within the month,because urgency. ------

## 2013-02-25 NOTE — Telephone Encounter (Signed)
[facility] called today, Bruce Cole has appt  August  14,  Does not need to hold medication   .

## 2013-02-28 DIAGNOSIS — N179 Acute kidney failure, unspecified: Secondary | ICD-10-CM | POA: Diagnosis not present

## 2013-03-01 ENCOUNTER — Telehealth (HOSPITAL_COMMUNITY): Payer: Self-pay | Admitting: *Deleted

## 2013-03-01 DIAGNOSIS — I2729 Other secondary pulmonary hypertension: Secondary | ICD-10-CM

## 2013-03-01 NOTE — Telephone Encounter (Signed)
Per Dr Bensimhon's 8/8 cath note pt needs sleep study, [place] is arranging

## 2013-03-05 ENCOUNTER — Encounter (HOSPITAL_COMMUNITY): Payer: Self-pay | Admitting: *Deleted

## 2013-03-05 ENCOUNTER — Ambulatory Visit (HOSPITAL_COMMUNITY): Payer: Medicare Other | Attending: Internal Medicine

## 2013-03-11 ENCOUNTER — Ambulatory Visit (HOSPITAL_COMMUNITY)
Admission: RE | Admit: 2013-03-11 | Discharge: 2013-03-11 | Disposition: A | Discharge status: Home / Self Care | Payer: Medicare Other | Source: Ambulatory Visit | Attending: Internal Medicine | Admitting: Internal Medicine

## 2013-03-11 ENCOUNTER — Encounter (HOSPITAL_COMMUNITY): Payer: Self-pay

## 2013-03-11 ENCOUNTER — Encounter: Payer: Self-pay | Admitting: Family

## 2013-03-11 ENCOUNTER — Ambulatory Visit (INDEPENDENT_AMBULATORY_CARE_PROVIDER_SITE_OTHER): Payer: Medicare Other | Admitting: Family

## 2013-03-11 VITALS — BP 138/88 | HR 46 | Temp 98.8°F | Resp 18 | Ht 72.0 in | Wt 279.0 lb

## 2013-03-11 VITALS — BP 126/90 | HR 57 | Wt 281.4 lb

## 2013-03-11 DIAGNOSIS — I10 Essential (primary) hypertension: Secondary | ICD-10-CM | POA: Diagnosis not present

## 2013-03-11 DIAGNOSIS — I5042 Chronic combined systolic (congestive) and diastolic (congestive) heart failure: Secondary | ICD-10-CM

## 2013-03-11 DIAGNOSIS — R0602 Shortness of breath: Secondary | ICD-10-CM | POA: Diagnosis not present

## 2013-03-11 DIAGNOSIS — Z79899 Other long term (current) drug therapy: Secondary | ICD-10-CM | POA: Insufficient documentation

## 2013-03-11 DIAGNOSIS — I509 Heart failure, unspecified: Secondary | ICD-10-CM | POA: Insufficient documentation

## 2013-03-11 DIAGNOSIS — I5032 Chronic diastolic (congestive) heart failure: Secondary | ICD-10-CM

## 2013-03-11 DIAGNOSIS — I498 Other specified cardiac arrhythmias: Secondary | ICD-10-CM | POA: Diagnosis not present

## 2013-03-11 DIAGNOSIS — I2789 Other specified pulmonary heart diseases: Secondary | ICD-10-CM | POA: Insufficient documentation

## 2013-03-11 DIAGNOSIS — R002 Palpitations: Secondary | ICD-10-CM | POA: Diagnosis not present

## 2013-03-11 DIAGNOSIS — N289 Disorder of kidney and ureter, unspecified: Secondary | ICD-10-CM | POA: Diagnosis not present

## 2013-03-11 DIAGNOSIS — I4891 Unspecified atrial fibrillation: Secondary | ICD-10-CM

## 2013-03-11 DIAGNOSIS — I5022 Chronic systolic (congestive) heart failure: Secondary | ICD-10-CM

## 2013-03-11 DIAGNOSIS — R609 Edema, unspecified: Secondary | ICD-10-CM | POA: Insufficient documentation

## 2013-03-11 LAB — BASIC METABOLIC PANEL
Calcium: 10 mg/dL (ref 8.4–10.5)
Creatinine, Ser: 1.86 mg/dL — ABNORMAL HIGH (ref 0.50–1.35)
GFR calc Af Amer: 41 mL/min — ABNORMAL LOW (ref >90)

## 2013-03-11 MED ORDER — METOLAZONE 2.5 MG PO TABS: ORAL_TABLET | ORAL | Status: DC

## 2013-03-11 NOTE — Assessment & Plan Note (Signed)
Now established with nephrology. Will request most recent lab work.

## 2013-03-11 NOTE — Assessment & Plan Note (Signed)
Bradycardic today- asymptomatic. Has cardiology follow up. Will defer med changes to cardiology.

## 2013-03-11 NOTE — Patient Instructions (Addendum)
Start taking metolazone 2.5 mg twice a week before you take your am lasix. On the days you take metolazone take 60 meq (3 tablets) of potassium, all other days take 40 meq potassium.  Will call with lab results.  Follow up 1 month with ECHO and BMET.

## 2013-03-11 NOTE — Assessment & Plan Note (Signed)
Resolved.  Clinically improved.  Pt has follow up with cardiology today.

## 2013-03-11 NOTE — Assessment & Plan Note (Addendum)
BP is stable. Continue current meds.  °

## 2013-03-11 NOTE — Patient Instructions (Addendum)
Please follow up in 3 months.  

## 2013-03-11 NOTE — Progress Notes (Signed)
Subjective:    Patient ID: Bruce Cole, male    DOB: 03-25-1941, 72 y.o.   MRN: [name]  HPI  Bruce Cole is a 72 yr old male who presents today for follow up of his CHF.  He recently underwent cardiac cath on 02/22/13 with Dr. Haroldine Laws. Cath report is reviewed: Cath noted normal coronary arteries, LVEF 45%, mild to moderate pulmonary venous HTN, markedly elevated RA pressure, diastolic dysfunction.  It was recommended that he continue his diruesis.  Lasix was changed to 80mg  BID.  Zaroxyln was added once daily (though pt admits to taking bid by accident). He was also referred for sleep study which is scheduled for september.  The patient has lost 31 pounds since his cardiac cath.  Reports that his SOB and edema is improved.    He saw Dr.  [name] (nephrology) last week who performed lab work.    Review of Systems See HPI  Notes some occasional HS palpitations- has been drinking a lot of tea.   Past Medical History  Diagnosis Date  . Nephrolithiasis   . Arthritis     rheumatoid  . Colon polyp 2000  . Atrial fibrillation   . Unspecified essential hypertension   . Gout     History   Social History  . Marital Status: Single    Spouse Name: N/A    Number of Children: 3  . Years of Education: N/A   Occupational History  .     Social History Main Topics  . Smoking status: Never Smoker   . Smokeless tobacco: Never Used  . Alcohol Use: Yes     Comment: occasional  . Drug Use: No  . Sexual Activity: Not on file   Other Topics Concern  . 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 Spine surgery      x 2  . Joint replacement      Total L-Hip replacement, Right Knee 10/20/09  . Cholecystectomy  1994    Family History  Problem Relation Age of Onset  . Hypertension Mother   . Arthritis Mother     ?RA  . Hypertension Father     No Known Allergies  Current  Outpatient Prescriptions on File Prior to Visit  Medication Sig Dispense Refill  . amLODipine (NORVASC) 10 MG tablet Take 1 tablet (10 mg total) by mouth daily.  30 tablet  2  . carvedilol (COREG) 25 MG tablet Take 0.5 tablets (12.5 mg total) by mouth 2 (two) times daily with a meal.  30 tablet  1  . fluticasone (FLONASE) 50 MCG/ACT nasal spray Place 2 sprays into the nose daily.  16 g  6  . furosemide (LASIX) 40 MG tablet Take 2 tablets (80 mg total) by mouth 2 (two) times daily.  120 tablet  1  . hydrALAZINE (APRESOLINE) 50 MG tablet Take 1 tablet (50 mg total) by mouth 3 (three) times daily.  90 tablet  3  . hydroxychloroquine (PLAQUENIL) 200 MG tablet Take 1 tablet by mouth Twice daily.      . methocarbamol (ROBAXIN) 500 MG tablet Take 500 mg by mouth 4 (four) times daily as needed. For spasms      . metolazone (ZAROXOLYN) 2.5 MG tablet Take one daily 30 min prior to your morning fluid pill dose  30 tablet  11  . potassium chloride SA (K-DUR,KLOR-CON) 20  MEQ tablet Take 1 tablet (20 mEq total) by mouth 3 (three) times daily.  90 tablet  11  . sildenafil (VIAGRA) 100 MG tablet Take 100 mg by mouth daily as needed for erectile dysfunction.       . sodium chloride (OCEAN) 0.65 % SOLN nasal spray Place 1 spray into the nose as needed for congestion.  15 mL  0  . terazosin (HYTRIN) 1 MG capsule Take 1 capsule (1 mg total) by mouth at bedtime.  30 capsule  0  . traMADol (ULTRAM) 50 MG tablet Take 50 mg by mouth Twice daily.      . valACYclovir (VALTREX) 500 MG tablet Take 500 mg by mouth 2 (two) times daily as needed.      . warfarin (COUMADIN) 7.5 MG tablet Take as directed by coumadin clinic  40 tablet  3   No current facility-administered medications on file prior to visit.    BP 138/88  Pulse 46  Temp(Src) 98.8 F (37.1 C) (Oral)  Resp 18  Ht 6' (1.829 m)  Wt 279 lb (126.554 kg)  BMI 37.83 kg/m2  SpO2 96%       Objective:   Physical Exam  Constitutional: He is oriented to  person, place, and time. He appears well-developed and well-nourished. No distress.  Cardiovascular:  No murmur heard. Bradycardic, irregular rate  Pulmonary/Chest: Effort normal and breath sounds normal. No respiratory distress. He has no wheezes. He has no rales. He exhibits no tenderness.  Musculoskeletal:  No lower extremity edema is noted.   Neurological: He is alert and oriented to person, place, and time.  Skin: Skin is warm and dry.  Psychiatric: He has a normal mood and affect. His behavior is normal. Judgment and thought content normal.          Assessment & Plan:

## 2013-03-12 ENCOUNTER — Ambulatory Visit: Payer: Medicare Other | Admitting: Family

## 2013-03-13 LAB — PROTEIN ELECTROPHORESIS, SERUM
Beta Globulin: 6.2 % (ref 4.7–7.2)
Gamma Globulin: 19 % — ABNORMAL HIGH (ref 11.1–18.8)
M-Spike, %: NOT DETECTED g/dL
Total Protein ELP: 7.8 g/dL (ref 6.0–8.3)

## 2013-03-13 LAB — UIFE/LIGHT CHAINS/TP QN, 24-HR UR
Alpha 1, Urine: DETECTED — AB
Beta, Urine: DETECTED — AB
Free Kappa Lt Chains,Ur: 1.33 mg/dL (ref 0.14–2.42)
Gamma Globulin, Urine: DETECTED — AB

## 2013-03-15 ENCOUNTER — Encounter (HOSPITAL_BASED_OUTPATIENT_CLINIC_OR_DEPARTMENT_OTHER): Payer: Self-pay | Admitting: *Deleted

## 2013-03-15 ENCOUNTER — Encounter: Payer: Self-pay | Admitting: Nephrology

## 2013-03-15 ENCOUNTER — Emergency Department (HOSPITAL_BASED_OUTPATIENT_CLINIC_OR_DEPARTMENT_OTHER)
Admission: EM | Admit: 2013-03-15 | Discharge: 2013-03-16 | Disposition: A | Discharge status: Home / Self Care | Payer: Medicare Other | Attending: Emergency Medicine | Admitting: Emergency Medicine

## 2013-03-15 DIAGNOSIS — R42 Dizziness and giddiness: Secondary | ICD-10-CM

## 2013-03-15 DIAGNOSIS — Z79899 Other long term (current) drug therapy: Secondary | ICD-10-CM | POA: Insufficient documentation

## 2013-03-15 DIAGNOSIS — I498 Other specified cardiac arrhythmias: Secondary | ICD-10-CM | POA: Diagnosis not present

## 2013-03-15 DIAGNOSIS — Z860100 Personal history of colon polyps, unspecified: Secondary | ICD-10-CM | POA: Insufficient documentation

## 2013-03-15 DIAGNOSIS — Z862 Personal history of diseases of the blood and blood-forming organs and certain disorders involving the immune mechanism: Secondary | ICD-10-CM | POA: Diagnosis not present

## 2013-03-15 DIAGNOSIS — Z7901 Long term (current) use of anticoagulants: Secondary | ICD-10-CM | POA: Diagnosis not present

## 2013-03-15 DIAGNOSIS — Z87442 Personal history of urinary calculi: Secondary | ICD-10-CM | POA: Insufficient documentation

## 2013-03-15 DIAGNOSIS — R001 Bradycardia, unspecified: Secondary | ICD-10-CM

## 2013-03-15 DIAGNOSIS — R110 Nausea: Secondary | ICD-10-CM | POA: Diagnosis not present

## 2013-03-15 DIAGNOSIS — I4891 Unspecified atrial fibrillation: Secondary | ICD-10-CM

## 2013-03-15 DIAGNOSIS — N289 Disorder of kidney and ureter, unspecified: Secondary | ICD-10-CM | POA: Diagnosis not present

## 2013-03-15 DIAGNOSIS — Z8739 Personal history of other diseases of the musculoskeletal system and connective tissue: Secondary | ICD-10-CM | POA: Diagnosis not present

## 2013-03-15 DIAGNOSIS — I10 Essential (primary) hypertension: Secondary | ICD-10-CM | POA: Diagnosis not present

## 2013-03-15 DIAGNOSIS — Z8639 Personal history of other endocrine, nutritional and metabolic disease: Secondary | ICD-10-CM | POA: Insufficient documentation

## 2013-03-15 LAB — BASIC METABOLIC PANEL
BUN: 54 mg/dL — ABNORMAL HIGH (ref 6–23)
Calcium: 10.1 mg/dL (ref 8.4–10.5)
Creatinine, Ser: 1.7 mg/dL — ABNORMAL HIGH (ref 0.50–1.35)
GFR calc Af Amer: 46 mL/min — ABNORMAL LOW (ref >90)
GFR calc non Af Amer: 39 mL/min — ABNORMAL LOW (ref >90)

## 2013-03-15 LAB — CBC WITH DIFFERENTIAL/PLATELET
Basophils Relative: 0 % (ref 0–1)
Eosinophils Absolute: 0.1 10*3/uL (ref 0.0–0.7)
HCT: 35 % — ABNORMAL LOW (ref 39.0–52.0)
Hemoglobin: 11.8 g/dL — ABNORMAL LOW (ref 13.0–17.0)
MCH: 31.2 pg (ref 26.0–34.0)
MCHC: 33.7 g/dL (ref 30.0–36.0)
Monocytes Absolute: 0.5 10*3/uL (ref 0.1–1.0)
Monocytes Relative: 7 % (ref 3–12)
Neutrophils Relative %: 78 % — ABNORMAL HIGH (ref 43–77)
RDW: 15 % (ref 11.5–15.5)

## 2013-03-15 MED ORDER — ONDANSETRON HCL 4 MG/2ML IJ SOLN
4.0000 mg | Freq: Once | INTRAMUSCULAR | Status: AC
  Administered 2013-03-15: 4 mg via INTRAVENOUS
  Filled 2013-03-15: qty 2

## 2013-03-15 NOTE — ED Notes (Signed)
MD at bedside. 

## 2013-03-15 NOTE — ED Notes (Signed)
Dizziness today. Denies pain. States his MD started him on Lasix and an unknown medication for CHF x 2 weeks.

## 2013-03-15 NOTE — ED Provider Notes (Signed)
CSN: [id number]     Arrival date & time 03/15/13  2244 History   First MD Initiated Contact with Patient 03/15/13 2303     Chief Complaint  Patient presents with  . Dizziness   (Consider location/radiation/quality/duration/timing/severity/associated sxs/prior Treatment) The history is provided by the patient.   72 year old male with a history of atrial fibrillation with ventricular response noted in that he was having nausea today and was having some problems with mild lightheadedness. When the check his blood pressure this evening, his heart rate was low. He frequently has heart rates that run in the 40s to low 50s. He denies chest pain, heaviness, tightness, pressure. He denies dyspnea and diaphoresis. Dizziness actually got better if he was ambulating. Symptoms are moderate. Of note, he had a heart catheterization about 3 weeks ago and was placed in the talus to augment diuresis and not house and was recently decreased from every day to twice a week.  Past Medical History  Diagnosis Date  . Nephrolithiasis   . Arthritis     rheumatoid  . Colon polyp 2000  . Atrial fibrillation   . Unspecified essential hypertension   . Gout    Past Surgical History  Procedure Laterality Date  . Spine surgery      x 2  . Joint replacement      Total L-Hip replacement, Right Knee 10/20/09  . Cholecystectomy  1994   Family History  Problem Relation Age of Onset  . Hypertension Mother   . Arthritis Mother     ?RA  . Hypertension Father    History  Substance Use Topics  . Smoking status: Never Smoker   . Smokeless tobacco: Never Used  . Alcohol Use: Yes     Comment: occasional    Review of Systems  All other systems reviewed and are negative.    Allergies  Review of patient's allergies indicates no known allergies.  Home Medications   Current Outpatient Rx  Name  Route  Sig  Dispense  Refill  . amLODipine (NORVASC) 10 MG tablet   Oral   Take 1 tablet (10 mg total) by mouth  daily.   30 tablet   2   . carvedilol (COREG) 25 MG tablet   Oral   Take 0.5 tablets (12.5 mg total) by mouth 2 (two) times daily with a meal.   30 tablet   1   . furosemide (LASIX) 40 MG tablet   Oral   Take 2 tablets (80 mg total) by mouth 2 (two) times daily.   120 tablet   1   . hydrALAZINE (APRESOLINE) 50 MG tablet   Oral   Take 1 tablet (50 mg total) by mouth 3 (three) times daily.   90 tablet   3   . metolazone (ZAROXOLYN) 2.5 MG tablet      Take one daily 30 min prior to your morning fluid pill dose 2 days a week.   10 tablet   11   . potassium chloride SA (K-DUR,KLOR-CON) 20 MEQ tablet   Oral   Take 1 tablet (20 mEq total) by mouth 3 (three) times daily.   90 tablet   11   . sildenafil (VIAGRA) 100 MG tablet   Oral   Take 100 mg by mouth daily as needed for erectile dysfunction.          [name] terazosin (HYTRIN) 1 MG capsule   Oral   Take 1 capsule (1 mg total) by mouth at bedtime.  30 capsule   0   . traMADol (ULTRAM) 50 MG tablet   Oral   Take 50 mg by mouth Twice daily.         [name] warfarin (COUMADIN) 7.5 MG tablet      Take as directed by coumadin clinic   40 tablet   3    BP 151/74  Pulse 42  Temp(Src) 97.8 F (36.6 C) (Oral)  Resp 18  Wt 281 lb (127.461 kg)  BMI 38.1 kg/m2  SpO2 100% Physical Exam  Nursing note and vitals reviewed.  72 year old male, resting comfortably and in no acute distress. Vital signs are significant for bradycardia with heart rate 42, and hypertension with blood pressure 151/74. Oxygen saturation is 100%, which is normal. Head is normocephalic and atraumatic. PERRLA, EOMI. Oropharynx is clear. Neck is nontender and supple without adenopathy or JVD. Back is nontender and there is no CVA tenderness. Lungs are clear without rales, wheezes, or rhonchi. Chest is nontender. Heart is bradycardic without murmur. Abdomen is soft, flat, nontender without masses or hepatosplenomegaly and peristalsis is  normoactive. Extremities have no cyanosis or edema, full range of motion is present. Skin is warm and dry without rash. Neurologic: Mental status is normal, cranial nerves are intact, there are no motor or sensory deficits.  ED Course  Procedures (including critical care time) Labs Review Results for orders placed during the hospital encounter of 03/15/13  CBC WITH DIFFERENTIAL      Result Value Range   WBC 7.4  4.0 - 10.5 K/uL   RBC 3.78 (*) 4.22 - 5.81 MIL/uL   Hemoglobin 11.8 (*) 13.0 - 17.0 g/dL   HCT 35.0 (*) 39.0 - 52.0 %   MCV 92.6  78.0 - 100.0 fL   MCH 31.2  26.0 - 34.0 pg   MCHC 33.7  30.0 - 36.0 g/dL   RDW 15.0  11.5 - 15.5 %   Platelets 181  150 - 400 K/uL   Neutrophils Relative % 78 (*) 43 - 77 %   Neutro Abs 5.8  1.7 - 7.7 K/uL   Lymphocytes Relative 14  12 - 46 %   Lymphs Abs 1.1  0.7 - 4.0 K/uL   Monocytes Relative 7  3 - 12 %   Monocytes Absolute 0.5  0.1 - 1.0 K/uL   Eosinophils Relative 1  0 - 5 %   Eosinophils Absolute 0.1  0.0 - 0.7 K/uL   Basophils Relative 0  0 - 1 %   Basophils Absolute 0.0  0.0 - 0.1 K/uL  BASIC METABOLIC PANEL      Result Value Range   Sodium 138  135 - 145 mEq/L   Potassium 3.5  3.5 - 5.1 mEq/L   Chloride 96  96 - 112 mEq/L   CO2 30  19 - 32 mEq/L   Glucose, Bld 173 (*) 70 - 99 mg/dL   BUN 54 (*) 6 - 23 mg/dL   Creatinine, Ser 1.70 (*) 0.50 - 1.35 mg/dL   Calcium 10.1  8.4 - 10.5 mg/dL   GFR calc non Af Amer 39 (*) >90 mL/min   GFR calc Af Amer 46 (*) >90 mL/min  PROTIME-INR      Result Value Range   Prothrombin Time 17.1 (*) 11.6 - 15.2 seconds   INR 1.43  0.00 - 1.49    Date: 03/15/2013  Rate: 47  Rhythm: atrial fibrillation and premature ventricular contractions (PVC)  QRS Axis: left  Intervals: normal  ST/T  Wave abnormalities: nonspecific T wave changes  Conduction Disutrbances:left anterior fascicular block  Narrative Interpretation:  Atrial fibrillation with slow ventricular response, occasional PVC, left anterior  fascicular block, nonspecific T wave flattening. When compared with ECG of 01/29/2013, no significant changes are seen.  Old EKG Reviewed: unchanged   MDM  No diagnosis found. Nausea and dizziness of uncertain cause. He does have bradycardia but this appears to be chronic. I reviewed his old records and he has multiple office visits with heart rates in the 40s. Recent heart catheterization showed evidence of tricuspid regurgitation but was otherwise unremarkable. The fact that his dizziness gets better with inhalation suggest that it is not healed volume related issue. You will be screened with metabolic panel and INR checked in the orthostatic vital signs checked. With this being in the talus and anterior is some, he certainly is at risk for hypokalemia.  Laboratory workup is unremarkable. He has renal insufficiency which is unchanged from baseline. INR is subtherapeutic and he will need to followup with his warfarin clinic 2 adjust his warfarin dose. Orthostatic vital signs did not show any drop in blood pressure. He is feeling much better. He is discharged with a prescription for ondansetron for nausea.  Delora Fuel, MD [phone]

## 2013-03-16 DIAGNOSIS — I2789 Other specified pulmonary heart diseases: Secondary | ICD-10-CM | POA: Insufficient documentation

## 2013-03-16 DIAGNOSIS — I5042 Chronic combined systolic (congestive) and diastolic (congestive) heart failure: Secondary | ICD-10-CM | POA: Insufficient documentation

## 2013-03-16 MED ORDER — ONDANSETRON HCL 4 MG PO TABS: 4.0000 mg | ORAL_TABLET | Freq: Four times a day (QID) | ORAL | Status: DC | PRN

## 2013-03-16 NOTE — Progress Notes (Signed)
Patient ID: Bruce Cole, male   DOB: 17-Sep-1940, 72 y.o.   MRN: [id number]  Weight Range   Baseline proBNP    HPI: Bruce Cole is a 72 yo male with a history of chronic atrial fibrillation, HTN and arthritis. He has been on chronic coumadin therapy. He was recently diagnosed with systolic HF EF A999333 with diffuse HK, grade 2 diastolic dysfunction, RV mod dilated, moderate TR.  R/LHC 02/2013 RA = 28 with prominent v-waves  RV = 67/21/28  PA = 70/23 (42)  PCW = 29  Fick cardiac output/index = 7.5/2.8  PVR = 1.8 Woods  SVR = 610  FA sat = 96%  PA sat = 64%, 70%  No RV LV interaction  Near equalization of RV, LV and RA diastolic pressures ** Essentially normal coronary arteries**  Follow up: Was supposed to see [place] last week, however he missed the appt and apologizes. Patient is to get a sleep study Sept 16th. Feeling good. Denies SOB, orthopnea, PND or CP. Weight at home 279 lbs. Taking medications as prescribed. Working out at [employer] 3x week for 2 hrs, and plays golf the other 2 days. Gets dyspneic only when he is really pushing it.   ROS: All systems negative except as listed in HPI, PMH and Problem List.  Past Medical History  Diagnosis Date  . Nephrolithiasis   . Arthritis     rheumatoid  . Colon polyp 2000  . Atrial fibrillation   . Unspecified essential hypertension   . Gout     Current Outpatient Prescriptions  Medication Sig Dispense Refill  . amLODipine (NORVASC) 10 MG tablet Take 1 tablet (10 mg total) by mouth daily.  30 tablet  2  . carvedilol (COREG) 25 MG tablet Take 0.5 tablets (12.5 mg total) by mouth 2 (two) times daily with a meal.  30 tablet  1  . furosemide (LASIX) 40 MG tablet Take 2 tablets (80 mg total) by mouth 2 (two) times daily.  120 tablet  1  . hydrALAZINE (APRESOLINE) 50 MG tablet Take 1 tablet (50 mg total) by mouth 3 (three) times daily.  90 tablet  3  . metolazone (ZAROXOLYN) 2.5 MG tablet Take one daily 30 min prior to your morning fluid  pill dose 2 days a week.  10 tablet  11  . potassium chloride SA (K-DUR,KLOR-CON) 20 MEQ tablet Take 1 tablet (20 mEq total) by mouth 3 (three) times daily.  90 tablet  11  . sildenafil (VIAGRA) 100 MG tablet Take 100 mg by mouth daily as needed for erectile dysfunction.       [name] terazosin (HYTRIN) 1 MG capsule Take 1 capsule (1 mg total) by mouth at bedtime.  30 capsule  0  . traMADol (ULTRAM) 50 MG tablet Take 50 mg by mouth Twice daily.      [name] warfarin (COUMADIN) 7.5 MG tablet Take as directed by coumadin clinic  40 tablet  3  . ondansetron (ZOFRAN) 4 MG tablet Take 1 tablet (4 mg total) by mouth every 6 (six) hours as needed for nausea.  20 tablet  0   No current facility-administered medications for this encounter.   PHYSICAL EXAM: Filed Vitals:   03/11/13 1554  BP: 126/90  Pulse: 57  Weight: 281 lb 6.4 oz (127.642 kg)  SpO2: 98%   General:  Well appearing. No resp difficulty HEENT: normal Neck: supple. JVP 8 prominent V waves. Carotids 2+ bilaterally; no bruits. No lymphadenopathy or thryomegaly appreciated. Cor: PMI  normal. Regular rate & rhythm. No rubs, gallops or murmurs. Lungs: clear Abdomen: soft, nontender, nondistended. No hepatosplenomegaly. No bruits or masses. Good bowel sounds. Extremities: no cyanosis, clubbing, rash,  LLE trace edema Neuro: alert & orientedx3, cranial nerves grossly intact. Moves all 4 extremities w/o difficulty. Affect pleasant.  ASSESSMENT & PLAN:  1) Chronic systolic HF, EF A999333 diff HK, grade 2 diastolic dysfx, LA mild dilated, RV mod dilated, RA mod/severe dilated. Mod TR --Cath with question of restrictive physiology - NYHA I-II symptoms. Volume status much improved now as weight down about 30 lbs since cath. - Continue coreg 12.5 mg BID. Consider adding Arlyce Harman next visit - Will change metolazone from 2.5 mg daily to twice week. Change potassium to 40 meq daily except on metolazone days take 60 meq - Check BMET today. - Will check SPEP and  UPEP today for concerns of possible amyloid - though I think this is unlikely. - Repeat echo to reassess TR and pulmonary pressures after recent diuresis  2) HTN - Controlled on coreg, norvasc, hydralazine, and terazosin  3) Probable OSA  -sleep study pending  4) AF -rate controlled. Continue coumadin. Discussed NOAC and he is not interested.  Glori Bickers MD 2:39 PM

## 2013-03-17 ENCOUNTER — Telehealth: Payer: Self-pay | Admitting: Family

## 2013-03-17 NOTE — Telephone Encounter (Signed)
pls arrange an ED follow up in 1-2 weeks.

## 2013-03-19 ENCOUNTER — Telehealth (HOSPITAL_COMMUNITY): Payer: Self-pay | Admitting: Cardiology

## 2013-03-19 ENCOUNTER — Other Ambulatory Visit (INDEPENDENT_AMBULATORY_CARE_PROVIDER_SITE_OTHER): Payer: Medicare Other

## 2013-03-19 ENCOUNTER — Other Ambulatory Visit: Payer: Self-pay | Admitting: Family

## 2013-03-19 ENCOUNTER — Ambulatory Visit (INDEPENDENT_AMBULATORY_CARE_PROVIDER_SITE_OTHER): Payer: Medicare Other

## 2013-03-19 DIAGNOSIS — Z7901 Long term (current) use of anticoagulants: Secondary | ICD-10-CM

## 2013-03-19 DIAGNOSIS — I5022 Chronic systolic (congestive) heart failure: Secondary | ICD-10-CM

## 2013-03-19 DIAGNOSIS — I4891 Unspecified atrial fibrillation: Secondary | ICD-10-CM | POA: Diagnosis not present

## 2013-03-19 LAB — BASIC METABOLIC PANEL
BUN: 46 mg/dL — ABNORMAL HIGH (ref 6–23)
CO2: 32 mEq/L (ref 19–32)
Calcium: 9.5 mg/dL (ref 8.4–10.5)
Creatinine, Ser: 1.5 mg/dL (ref 0.4–1.5)
Glucose, Bld: 127 mg/dL — ABNORMAL HIGH (ref 70–99)

## 2013-03-19 NOTE — Telephone Encounter (Signed)
See lab note, pt taking 100 meq today and starting 60 daily, repeat 9/10

## 2013-03-19 NOTE — Telephone Encounter (Signed)
Left detailed message informing patient to call our office to schedule a ED follow up visit.

## 2013-03-19 NOTE — Telephone Encounter (Signed)
Critical lab results Potassium 2.8

## 2013-03-20 ENCOUNTER — Other Ambulatory Visit: Payer: Medicare Other

## 2013-03-21 NOTE — Telephone Encounter (Signed)
Pt has follow up with Elyn Aquas, PA on 03/22/13.

## 2013-03-21 NOTE — Telephone Encounter (Signed)
Left detailed message informing patient to call our office to schedule an ED follow up visit.

## 2013-03-22 ENCOUNTER — Ambulatory Visit (INDEPENDENT_AMBULATORY_CARE_PROVIDER_SITE_OTHER): Payer: Medicare Other | Admitting: Physician Assistant

## 2013-03-22 ENCOUNTER — Encounter: Payer: Self-pay | Admitting: Physician Assistant

## 2013-03-22 VITALS — BP 126/78 | HR 41 | Temp 98.2°F | Resp 18 | Wt 282.0 lb

## 2013-03-22 DIAGNOSIS — I10 Essential (primary) hypertension: Secondary | ICD-10-CM | POA: Diagnosis not present

## 2013-03-22 DIAGNOSIS — E876 Hypokalemia: Secondary | ICD-10-CM | POA: Diagnosis not present

## 2013-03-22 MED ORDER — ONDANSETRON HCL 4 MG PO TABS: 4.0000 mg | ORAL_TABLET | Freq: Four times a day (QID) | ORAL | Status: DC | PRN

## 2013-03-22 MED ORDER — CARVEDILOL 12.5 MG PO TABS: 6.2500 mg | ORAL_TABLET | Freq: Two times a day (BID) | ORAL | Status: DC

## 2013-03-22 MED ORDER — ONDANSETRON HCL 4 MG PO TABS
4.0000 mg | ORAL_TABLET | Freq: Four times a day (QID) | ORAL | Status: DC | PRN
Start: 2013-03-22 — End: 2013-03-22

## 2013-03-22 NOTE — Progress Notes (Signed)
Patient ID: DEAIRE CLENNON, male   DOB: July 10, 1941, 72 y.o.   MRN: [id number]  Patient presents to clinic today for ED follow-up of dizziness and bradycardia on 8/29.  Information was obtained from the patient.    Patient was experiencing symptoms of nausea, dizziness and lightheadedness.  Noted his pulse was low.  Proceeded to the ED.  In the ED patient was evaluated and no formal diagnosis was made.  Thought the symptoms could be coming from bradycardia which is potentially exacerbated from recent change to carvedilol from diovan.  Patient denies change in blood pressure.  In ED, labs were drawn and patient's potassium levels were markedly low.  Potassium given and instructions for home potassium supplementation change made.  Patient has followed up with vein specialist.  Potassium and INR rechecked.  This specialist is currently managing his potassium supplementation.  Patient to return to lab on 9/10 for additional labs.  Since Ed discharge patient states he has been feeling good overall.  Denies lightheadedness, dizziness, chest pain, syncope, heart palpitations.  States BP is checked daily and is found to be good.  States his usual resting heart rate has been in the mid 50s in the past, but since switching medication it is no in the 40s when checked at home.  Has had some residual nausea but states it is improving each day.   Past Medical History  Diagnosis Date  . Nephrolithiasis   . Arthritis     rheumatoid  . Colon polyp 2000  . Atrial fibrillation   . Unspecified essential hypertension   . Gout     Current Outpatient Prescriptions on File Prior to Visit  Medication Sig Dispense Refill  . amLODipine (NORVASC) 10 MG tablet Take 1 tablet (10 mg total) by mouth daily.  30 tablet  2  . furosemide (LASIX) 40 MG tablet Take 2 tablets (80 mg total) by mouth 2 (two) times daily.  120 tablet  1  . hydrALAZINE (APRESOLINE) 50 MG tablet Take 1 tablet (50 mg total) by mouth 3 (three) times  daily.  90 tablet  3  . metolazone (ZAROXOLYN) 2.5 MG tablet Take one daily 30 min prior to your morning fluid pill dose 2 days a week.  10 tablet  11  . potassium chloride SA (K-DUR,KLOR-CON) 20 MEQ tablet Take 1 tablet (20 mEq total) by mouth 3 (three) times daily.  90 tablet  11  . sildenafil (VIAGRA) 100 MG tablet Take 100 mg by mouth daily as needed for erectile dysfunction.       [name] terazosin (HYTRIN) 1 MG capsule TAKE ONE CAPSULE BY MOUTH EVERY NIGHT AT BEDTIME  30 capsule  2  . traMADol (ULTRAM) 50 MG tablet Take 50 mg by mouth Twice daily.      [name] warfarin (COUMADIN) 7.5 MG tablet Take as directed by coumadin clinic  40 tablet  3   No current facility-administered medications on file prior to visit.    No Known Allergies  Family History  Problem Relation Age of Onset  . Hypertension Mother   . Arthritis Mother     ?RA  . Hypertension Father     History   Social History  . Marital Status: Single    Spouse Name: N/A    Number of Children: 3  . Years of Education: N/A   Occupational History  .     Social History Main Topics  . Smoking status: Never Smoker   . Smokeless tobacco: Never Used  .  Alcohol Use: Yes     Comment: occasional  . Drug Use: No  . Sexual Activity: None   Other Topics Concern  . None   Social History Narrative   Former Clinical biochemist and [employer]  Constitutional: Negative for fever, chills, weight loss and malaise/fatigue.  Eyes: Negative for blurred vision and double vision.  Cardiovascular: Negative for chest pain and palpitations.  Gastrointestinal: Positive for nausea. Negative for vomiting, abdominal pain, diarrhea and constipation.  Neurological: Negative for dizziness, loss of consciousness and headaches.   Filed Vitals:   03/22/13 0944  BP: 126/78  Pulse: 41  Temp: 98.2 F (36.8 C)  Resp: 18   Physical Exam  Vitals reviewed. Constitutional: He is oriented to person, place, and time and  well-developed, well-nourished, and in no distress.  HENT:  Head: Normocephalic and atraumatic.  Eyes: Conjunctivae are normal. Pupils are equal, round, and reactive to light.  Cardiovascular: Regular rhythm, normal heart sounds and intact distal pulses.   No extrasystoles are present. Bradycardia present.   Pulses:      Radial pulses are 2+ on the right side, and 2+ on the left side.  Neurological: He is alert and oriented to person, place, and time.  Skin: Skin is warm and dry. No rash noted.     Recent Results (from the past 2160 hour(s))  POCT INR     Status: None   Collection Time    01/01/13  8:19 AM      Result Value Range   INR 2.4    BASIC METABOLIC PANEL     Status: Abnormal   Collection Time    01/08/13  8:43 AM      Result Value Range   Sodium 139  135 - 145 mEq/L   Potassium 4.0  3.5 - 5.3 mEq/L   Chloride 103  96 - 112 mEq/L   CO2 29  19 - 32 mEq/L   Glucose, Bld 116 (*) 70 - 99 mg/dL   BUN 28 (*) 6 - 23 mg/dL   Creat 1.50 (*) 0.50 - 1.35 mg/dL   Calcium 9.7  8.4 - 10.5 mg/dL  BRAIN NATRIURETIC PEPTIDE     Status: Abnormal   Collection Time    01/08/13  8:43 AM      Result Value Range   Brain Natriuretic Peptide 101.5 (*) 0.0 - 100.0 pg/mL  CBC WITH DIFFERENTIAL     Status: Abnormal   Collection Time    01/29/13  5:56 AM      Result Value Range   WBC 8.7  4.0 - 10.5 K/uL   RBC 3.77 (*) 4.22 - 5.81 MIL/uL   Hemoglobin 11.8 (*) 13.0 - 17.0 g/dL   HCT 35.4 (*) 39.0 - 52.0 %   MCV 93.9  78.0 - 100.0 fL   MCH 31.3  26.0 - 34.0 pg   MCHC 33.3  30.0 - 36.0 g/dL   RDW 15.8 (*) 11.5 - 15.5 %   Platelets 159  150 - 400 K/uL   Neutrophils Relative % 76  43 - 77 %   Neutro Abs 6.6  1.7 - 7.7 K/uL   Lymphocytes Relative 16  12 - 46 %   Lymphs Abs 1.4  0.7 - 4.0 K/uL   Monocytes Relative 7  3 - 12 %   Monocytes Absolute 0.6  0.1 - 1.0 K/uL   Eosinophils Relative 2  0 - 5 %   Eosinophils Absolute 0.2  0.0 - 0.7 K/uL   Basophils Relative 0  0 - 1 %   Basophils  Absolute 0.0  0.0 - 0.1 K/uL  COMPREHENSIVE METABOLIC PANEL     Status: Abnormal   Collection Time    01/29/13  5:56 AM      Result Value Range   Sodium 140  135 - 145 mEq/L   Potassium 3.5  3.5 - 5.1 mEq/L   Chloride 101  96 - 112 mEq/L   CO2 28  19 - 32 mEq/L   Glucose, Bld 118 (*) 70 - 99 mg/dL   BUN 26 (*) 6 - 23 mg/dL   Creatinine, Ser 1.60 (*) 0.50 - 1.35 mg/dL   Calcium 9.9  8.4 - 10.5 mg/dL   Total Protein 7.2  6.0 - 8.3 g/dL   Albumin 3.8  3.5 - 5.2 g/dL   AST 36  0 - 37 U/L   ALT 22  0 - 53 U/L   Alkaline Phosphatase 182 (*) 39 - 117 U/L   Total Bilirubin 0.5  0.3 - 1.2 mg/dL   GFR calc non Af Amer 42 (*) >90 mL/min   GFR calc Af Amer 49 (*) >90 mL/min   Comment:            The eGFR has been calculated     using the CKD EPI equation.     This calculation has not been     validated in all clinical     situations.     eGFR's persistently     <90 mL/min signify     possible Chronic Kidney Disease.  TROPONIN I     Status: None   Collection Time    01/29/13  5:56 AM      Result Value Range   Troponin I <0.30  <0.30 ng/mL   Comment:            Due to the release kinetics of cTnI,     a negative result within the first hours     of the onset of symptoms does not rule out     myocardial infarction with certainty.     If myocardial infarction is still suspected,     repeat the test at appropriate intervals.  PROTIME-INR     Status: Abnormal   Collection Time    01/29/13  5:56 AM      Result Value Range   Prothrombin Time 29.9 (*) 11.6 - 15.2 seconds   INR 2.98 (*) 0.00 - 1.49  PRO B NATRIURETIC PEPTIDE     Status: Abnormal   Collection Time    01/29/13  5:56 AM      Result Value Range   Pro B Natriuretic peptide (BNP) 390.7 (*) 0 - 125 pg/mL  IRON     Status: None   Collection Time    02/13/13  3:05 PM      Result Value Range   Iron 55  42 - 165 ug/dL  PROTIME-INR     Status: Abnormal   Collection Time    02/13/13  3:05 PM      Result Value Range    Prothrombin Time 23.5 (*) 11.6 - 15.2 seconds   INR 2.18 (*) <1.50   Comment: The INR is of principal utility in following patients on stable doses     of oral anticoagulants.  The therapeutic range is generally 2.0 to     3.0, but may be 3.0 to 4.0 in patients with mechanical cardiac  valves,     recurrent embolisms and antiphospholipid antibodies (including lupus     inhibitors).  BASIC METABOLIC PANEL     Status: Abnormal   Collection Time    02/13/13  3:05 PM      Result Value Range   Sodium 141  135 - 145 mEq/L   Potassium 3.9  3.5 - 5.3 mEq/L   Chloride 103  96 - 112 mEq/L   CO2 32  19 - 32 mEq/L   Glucose, Bld 98  70 - 99 mg/dL   BUN 25 (*) 6 - 23 mg/dL   Creat 1.77 (*) 0.50 - 1.35 mg/dL   Calcium 9.4  8.4 - 10.5 mg/dL  BASIC METABOLIC PANEL     Status: Abnormal   Collection Time    02/19/13  9:47 AM      Result Value Range   Sodium 140  135 - 145 mEq/L   Potassium 3.5  3.5 - 5.1 mEq/L   Chloride 101  96 - 112 mEq/L   CO2 32  19 - 32 mEq/L   Glucose, Bld 111 (*) 70 - 99 mg/dL   BUN 19  6 - 23 mg/dL   Creatinine, Ser 1.5  0.4 - 1.5 mg/dL   Calcium 9.4  8.4 - 10.5 mg/dL   GFR 57.76 (*) >60.00 mL/min  CBC WITH DIFFERENTIAL     Status: Abnormal   Collection Time    02/19/13  9:47 AM      Result Value Range   WBC 8.0  4.5 - 10.5 K/uL   RBC 3.81 (*) 4.22 - 5.81 Mil/uL   Hemoglobin 11.9 (*) 13.0 - 17.0 g/dL   HCT 36.1 (*) 39.0 - 52.0 %   MCV 94.8  78.0 - 100.0 fl   MCHC 32.8  30.0 - 36.0 g/dL   RDW 16.5 (*) 11.5 - 14.6 %   Platelets 186.0  150.0 - 400.0 K/uL   Neutrophils Relative % 71.0  43.0 - 77.0 %   Lymphocytes Relative 19.3  12.0 - 46.0 %   Monocytes Relative 7.5  3.0 - 12.0 %   Eosinophils Relative 1.7  0.0 - 5.0 %   Basophils Relative 0.5  0.0 - 3.0 %   Neutro Abs 5.7  1.4 - 7.7 K/uL   Lymphs Abs 1.5  0.7 - 4.0 K/uL   Monocytes Absolute 0.6  0.1 - 1.0 K/uL   Eosinophils Absolute 0.1  0.0 - 0.7 K/uL   Basophils Absolute 0.0  0.0 - 0.1 K/uL  PROTIME-INR      Status: Abnormal   Collection Time    02/19/13  9:47 AM      Result Value Range   Prothrombin Time 28.1 (*) 10.2 - 12.4 sec   INR 2.7 (*) 0.8 - 1.0 ratio  PROTIME-INR     Status: Abnormal   Collection Time    02/22/13  6:11 AM      Result Value Range   Prothrombin Time 17.7 (*) 11.6 - 15.2 seconds   INR 1.50 (*) 0.00 - 1.49  POCT I-STAT 3, BLOOD GAS (G3+)     Status: Abnormal   Collection Time    02/22/13  8:14 AM      Result Value Range   pH, Arterial 7.430  7.350 - 7.450   pCO2 arterial 41.5  35.0 - 45.0 mmHg   pO2, Arterial 77.0 (*) 80.0 - 100.0 mmHg   Bicarbonate 27.6 (*) 20.0 - 24.0 mEq/L   TCO2 29  0 -  100 mmol/L   O2 Saturation 96.0     Acid-Base Excess 3.0 (*) 0.0 - 2.0 mmol/L   Sample type ARTERIAL    POCT I-STAT 3, BLOOD GAS (G3P V)     Status: Abnormal   Collection Time    02/22/13  8:22 AM      Result Value Range   pH, Ven 7.386 (*) 7.250 - 7.300   pCO2, Ven 43.8 (*) 45.0 - 50.0 mmHg   pO2, Ven 34.0  30.0 - 45.0 mmHg   Bicarbonate 26.2 (*) 20.0 - 24.0 mEq/L   TCO2 28  0 - 100 mmol/L   O2 Saturation 64.0     Acid-Base Excess 1.0  0.0 - 2.0 mmol/L   Sample type VENOUS     Comment NOTIFIED PHYSICIAN    POCT I-STAT 3, BLOOD GAS (G3P V)     Status: Abnormal   Collection Time    02/22/13  8:23 AM      Result Value Range   pH, Ven 7.404 (*) 7.250 - 7.300   pCO2, Ven 46.3  45.0 - 50.0 mmHg   pO2, Ven 37.0  30.0 - 45.0 mmHg   Bicarbonate 29.0 (*) 20.0 - 24.0 mEq/L   TCO2 30  0 - 100 mmol/L   O2 Saturation 70.0     Acid-Base Excess 4.0 (*) 0.0 - 2.0 mmol/L   Sample type VENOUS     Comment NOTIFIED PHYSICIAN    BASIC METABOLIC PANEL     Status: Abnormal   Collection Time    03/11/13  4:21 PM      Result Value Range   Sodium 136  135 - 145 mEq/L   Potassium 3.1 (*) 3.5 - 5.1 mEq/L   Chloride 93 (*) 96 - 112 mEq/L   CO2 30  19 - 32 mEq/L   Glucose, Bld 113 (*) 70 - 99 mg/dL   BUN 60 (*) 6 - 23 mg/dL   Creatinine, Ser 1.86 (*) 0.50 - 1.35 mg/dL   Calcium  10.0  8.4 - 10.5 mg/dL   GFR calc non Af Amer 35 (*) >90 mL/min   GFR calc Af Amer 41 (*) >90 mL/min   Comment: (NOTE)     The eGFR has been calculated using the CKD EPI equation.     This calculation has not been validated in all clinical situations.     eGFR's persistently <90 mL/min signify possible Chronic Kidney     Disease.  IMMUNOFIXATION ELECTROPHORESIS, URINE (WITH TOT PROT)     Status: Abnormal   Collection Time    03/11/13  4:40 PM      Result Value Range   Time RANDOM     Comment: CORRECTED ON 08/27 AT 1001: PREVIOUSLY REPORTED AS URINE, RANDOM   Volume, Urine RANDOM     Comment: CORRECTED ON 08/27 AT 1001: PREVIOUSLY REPORTED AS URINE, RANDOM   Total Protein, Urine 13.7     Comment: No established reference range.   Total Protein, Urine-Ur/day NOT CALC  10 - 140 mg/day   Comment: (NOTE)     Total urinary protein is determined by adding the albumin and Kappa     and/or Lambda light chains.  This value may not agree with the total     protein as determined by chemical methods, which characteristically     underestimate urinary light chains.   Albumin, U DETECTED  DETECTED   Alpha 1, Urine DETECTED (*) NONE DETECTED   Alpha 2, Urine DETECTED (*) NONE  DETECTED   Beta, Urine DETECTED (*) NONE DETECTED   Gamma Globulin, Urine DETECTED (*) NONE DETECTED   Free Kappa Lt Chains,Ur 1.33  0.14 - 2.42 mg/dL   Free Lt Chn Excr Rate NOT CALC     Free Lambda Lt Chains,Ur 0.60  0.02 - 0.67 mg/dL   Free Lambda Excretion/Day NOT CALC     Free Kappa/Lambda Ratio 2.22  2.04 - 10.37 ratio   Comment: (NOTE)   Immunofixation, Urine (NOTE)     Comment: No monoclonal free light chains (Bence Jones Protein) are detected.     Urine IFE shows polyclonal increase in free Kappa and/or free Lambda     light chains.     Reviewed by Odis Hollingshead, MD, PhD, FCAP (Electronic Signature on     File)     Performed at [place], [facility]     Status: Abnormal    Collection Time    03/11/13  4:41 PM      Result Value Range   Total Protein ELP 7.8  6.0 - 8.3 g/dL   Albumin ELP 54.1 (*) 55.8 - 66.1 %   Alpha-1-Globulin 4.4  2.9 - 4.9 %   Alpha-2-Globulin 10.1  7.1 - 11.8 %   Beta Globulin 6.2  4.7 - 7.2 %   Beta 2 6.2  3.2 - 6.5 %   Gamma Globulin 19.0 (*) 11.1 - 18.8 %   M-Spike, % NOT DETECTED     SPE Interp. (NOTE)     Comment: Nonspecific diffuse polyclonal type increase in gamma globulins.     Results are consistent with SPE performed on 02/15/12     Reviewed by Odis Hollingshead, MD, PhD, FCAP (Electronic Signature on     File)   Comment (NOTE)     Comment: ---------------     Serum protein electrophoresis is a useful screening procedure in the     detection of various pathophysiologic states such as inflammation,     gammopathies, protein loss and other dysproteinemias.  Immunofixation     electrophoresis (IFE) is a more sensitive technique for the     identification of M-proteins found in patients with monoclonal     gammopathy of unknown significance (MGUS), amyloidosis, early or     treated myeloma or macroglobulinemia, solitary plasmacytoma or     extramedullary plasmacytoma.     Performed at [facility]  CBC WITH DIFFERENTIAL     Status: Abnormal   Collection Time    03/15/13 11:16 PM      Result Value Range   WBC 7.4  4.0 - 10.5 K/uL   RBC 3.78 (*) 4.22 - 5.81 MIL/uL   Hemoglobin 11.8 (*) 13.0 - 17.0 g/dL   HCT 35.0 (*) 39.0 - 52.0 %   MCV 92.6  78.0 - 100.0 fL   MCH 31.2  26.0 - 34.0 pg   MCHC 33.7  30.0 - 36.0 g/dL   RDW 15.0  11.5 - 15.5 %   Platelets 181  150 - 400 K/uL   Neutrophils Relative % 78 (*) 43 - 77 %   Neutro Abs 5.8  1.7 - 7.7 K/uL   Lymphocytes Relative 14  12 - 46 %   Lymphs Abs 1.1  0.7 - 4.0 K/uL   Monocytes Relative 7  3 - 12 %   Monocytes Absolute 0.5  0.1 - 1.0 K/uL   Eosinophils Relative 1  0 - 5 %   Eosinophils Absolute 0.1  0.0 - 0.7 K/uL   Basophils Relative 0  0 - 1 %   Basophils  Absolute 0.0  0.0 - 0.1 K/uL  BASIC METABOLIC PANEL     Status: Abnormal   Collection Time    03/15/13 11:16 PM      Result Value Range   Sodium 138  135 - 145 mEq/L   Potassium 3.5  3.5 - 5.1 mEq/L   Chloride 96  96 - 112 mEq/L   CO2 30  19 - 32 mEq/L   Glucose, Bld 173 (*) 70 - 99 mg/dL   BUN 54 (*) 6 - 23 mg/dL   Creatinine, Ser 1.70 (*) 0.50 - 1.35 mg/dL   Calcium 10.1  8.4 - 10.5 mg/dL   GFR calc non Af Amer 39 (*) >90 mL/min   GFR calc Af Amer 46 (*) >90 mL/min   Comment: (NOTE)     The eGFR has been calculated using the CKD EPI equation.     This calculation has not been validated in all clinical situations.     eGFR's persistently <90 mL/min signify possible Chronic Kidney     Disease.  PROTIME-INR     Status: Abnormal   Collection Time    03/15/13 11:16 PM      Result Value Range   Prothrombin Time 17.1 (*) 11.6 - 15.2 seconds   INR 1.43  0.00 - 1.49  POCT INR     Status: None   Collection Time    03/19/13 11:03 AM      Result Value Range   INR 2.7    BASIC METABOLIC PANEL     Status: Abnormal   Collection Time    03/19/13 11:10 AM      Result Value Range   Sodium 136  135 - 145 mEq/L   Potassium 2.8 (*) 3.5 - 5.1 mEq/L   Chloride 97  96 - 112 mEq/L   CO2 32  19 - 32 mEq/L   Glucose, Bld 127 (*) 70 - 99 mg/dL   BUN 46 (*) 6 - 23 mg/dL   Creatinine, Ser 1.5  0.4 - 1.5 mg/dL   Calcium 9.5  8.4 - 10.5 mg/dL   GFR 59.52 (*) >60.00 mL/min    Assessment/Plan: HYPERTENSION BP good in clinic today.  Patient bradycardic which could be cause of lightheadedness.  Decrease carvedilol from 12.5 mg BID to 6.25 mg BID.  Continue home BP checks.  Follow-up in 2 weeks.  Hypokalemia Patient taking KCL supplementation TID.  Has follow-up with vascular specialist on 9/10 with repeat potassium and INR levels.

## 2013-03-22 NOTE — Assessment & Plan Note (Signed)
BP good in clinic today.  Patient bradycardic which could be cause of lightheadedness.  Decrease carvedilol from 12.5 mg BID to 6.25 mg BID.  Continue home BP checks.  Follow-up in 2 weeks.

## 2013-03-22 NOTE — Patient Instructions (Signed)
Please stop taking the 25 mg tablets.  Start new prescription which is a 12.5 mg tablet.  You should take 1/2 tablet twice a day.  Continue to monitor heart rate and blood pressure at home.  Please stay hydrated.  Continue potassium supplement 3 times a day and remember to go for lab work on 9/10 to recheck potassium levels.  If you experience severe lightheadedness, dizziness, or pass out, you need to go directly to the ED.  I will need to see you in 2 weeks to recheck your BP in clinic.

## 2013-03-22 NOTE — Assessment & Plan Note (Signed)
Patient taking KCL supplementation TID.  Has follow-up with vascular specialist on 9/10 with repeat potassium and INR levels.

## 2013-03-25 ENCOUNTER — Telehealth: Payer: Self-pay | Admitting: *Deleted

## 2013-03-25 NOTE — Telephone Encounter (Signed)
Faxed Prior Authorization request received from pharmacy for Ondansetron 4 mg Last filled by MD on 09.05.14, #[id number] Phoned [employer] at [phone] to initiate PA, had to call back d/t "Inclement Weather on [employer]" numerous times throughout day; finally reach representative Chez who ran patient's criteria & informed me that this request needed to be processed via Medicare [who has their own department], was transferred to representative Tawni Carnes who took patient's criteria information and Approved medication for [3] years. Per representative, Medicare will fax copy of Approval & contact patient to inform. Called pharmacy and verified that Approval for Rx was received/SLS

## 2013-03-27 ENCOUNTER — Other Ambulatory Visit (INDEPENDENT_AMBULATORY_CARE_PROVIDER_SITE_OTHER): Payer: Medicare Other

## 2013-03-27 DIAGNOSIS — I5022 Chronic systolic (congestive) heart failure: Secondary | ICD-10-CM | POA: Diagnosis not present

## 2013-03-27 LAB — BASIC METABOLIC PANEL
BUN: 33 mg/dL — ABNORMAL HIGH (ref 6–23)
Chloride: 96 mEq/L (ref 96–112)
Creatinine, Ser: 1.5 mg/dL (ref 0.4–1.5)
GFR: 59.98 mL/min — ABNORMAL LOW (ref >60.00)

## 2013-03-28 ENCOUNTER — Telehealth (HOSPITAL_COMMUNITY): Payer: Self-pay | Admitting: *Deleted

## 2013-03-28 DIAGNOSIS — I5022 Chronic systolic (congestive) heart failure: Secondary | ICD-10-CM

## 2013-03-28 MED ORDER — SPIRONOLACTONE 25 MG PO TABS: 25.0000 mg | ORAL_TABLET | Freq: Every day | ORAL | Status: DC

## 2013-03-28 NOTE — Telephone Encounter (Signed)
Message copied by Scarlette Calico on Thu Mar 28, 2013  5:45 PM ------      Message from: Glori Bickers R      Created: Wed Mar 27, 2013  6:29 PM       Give 40kcl. Add spiro 25 daily. Repeat 1 week. ------

## 2013-03-28 NOTE — Telephone Encounter (Signed)
bmet 9/19 at [facility], spiro sent in

## 2013-04-02 ENCOUNTER — Ambulatory Visit (HOSPITAL_BASED_OUTPATIENT_CLINIC_OR_DEPARTMENT_OTHER): Payer: Medicare Other | Attending: Internal Medicine | Admitting: Radiology

## 2013-04-02 VITALS — Ht 74.0 in | Wt 275.0 lb

## 2013-04-02 DIAGNOSIS — G4733 Obstructive sleep apnea (adult) (pediatric): Secondary | ICD-10-CM | POA: Insufficient documentation

## 2013-04-02 DIAGNOSIS — I2729 Other secondary pulmonary hypertension: Secondary | ICD-10-CM

## 2013-04-02 DIAGNOSIS — I4891 Unspecified atrial fibrillation: Secondary | ICD-10-CM | POA: Diagnosis not present

## 2013-04-05 ENCOUNTER — Other Ambulatory Visit (INDEPENDENT_AMBULATORY_CARE_PROVIDER_SITE_OTHER): Payer: Medicare Other

## 2013-04-05 DIAGNOSIS — I5022 Chronic systolic (congestive) heart failure: Secondary | ICD-10-CM

## 2013-04-05 LAB — BASIC METABOLIC PANEL WITH GFR
BUN: 39 mg/dL — ABNORMAL HIGH (ref 6–23)
CO2: 28 meq/L (ref 19–32)
Calcium: 10 mg/dL (ref 8.4–10.5)
Chloride: 96 meq/L (ref 96–112)
Creatinine, Ser: 1.6 mg/dL — ABNORMAL HIGH (ref 0.4–1.5)
GFR: 54.46 mL/min — ABNORMAL LOW
Glucose, Bld: 107 mg/dL — ABNORMAL HIGH (ref 70–99)
Potassium: 4.1 meq/L (ref 3.5–5.1)
Sodium: 137 meq/L (ref 135–145)

## 2013-04-08 ENCOUNTER — Ambulatory Visit
Admission: RE | Admit: 2013-04-08 | Discharge: 2013-04-08 | Disposition: A | Discharge status: Home / Self Care | Payer: Medicare Other | Source: Ambulatory Visit | Attending: Nephrology | Admitting: Nephrology

## 2013-04-08 ENCOUNTER — Other Ambulatory Visit: Payer: Self-pay | Admitting: Nephrology

## 2013-04-08 DIAGNOSIS — N179 Acute kidney failure, unspecified: Secondary | ICD-10-CM

## 2013-04-08 DIAGNOSIS — N189 Chronic kidney disease, unspecified: Secondary | ICD-10-CM | POA: Diagnosis not present

## 2013-04-10 ENCOUNTER — Other Ambulatory Visit: Payer: Self-pay | Admitting: Family

## 2013-04-11 ENCOUNTER — Ambulatory Visit (HOSPITAL_BASED_OUTPATIENT_CLINIC_OR_DEPARTMENT_OTHER)
Admission: RE | Admit: 2013-04-11 | Discharge: 2013-04-11 | Disposition: A | Discharge status: Home / Self Care | Payer: Medicare Other | Source: Ambulatory Visit | Attending: Internal Medicine | Admitting: Internal Medicine

## 2013-04-11 ENCOUNTER — Encounter (HOSPITAL_COMMUNITY): Payer: Self-pay

## 2013-04-11 ENCOUNTER — Ambulatory Visit (HOSPITAL_COMMUNITY)
Admission: RE | Admit: 2013-04-11 | Discharge: 2013-04-11 | Disposition: A | Discharge status: Home / Self Care | Payer: Medicare Other | Source: Ambulatory Visit | Attending: Internal Medicine | Admitting: Internal Medicine

## 2013-04-11 VITALS — BP 124/68 | HR 49 | Ht 74.0 in | Wt 277.8 lb

## 2013-04-11 DIAGNOSIS — I079 Rheumatic tricuspid valve disease, unspecified: Secondary | ICD-10-CM | POA: Insufficient documentation

## 2013-04-11 DIAGNOSIS — I4891 Unspecified atrial fibrillation: Secondary | ICD-10-CM | POA: Diagnosis not present

## 2013-04-11 DIAGNOSIS — N289 Disorder of kidney and ureter, unspecified: Secondary | ICD-10-CM | POA: Diagnosis not present

## 2013-04-11 DIAGNOSIS — I369 Nonrheumatic tricuspid valve disorder, unspecified: Secondary | ICD-10-CM

## 2013-04-11 DIAGNOSIS — I379 Nonrheumatic pulmonary valve disorder, unspecified: Secondary | ICD-10-CM | POA: Insufficient documentation

## 2013-04-11 DIAGNOSIS — I10 Essential (primary) hypertension: Secondary | ICD-10-CM

## 2013-04-11 DIAGNOSIS — I5032 Chronic diastolic (congestive) heart failure: Secondary | ICD-10-CM | POA: Diagnosis not present

## 2013-04-11 DIAGNOSIS — I519 Heart disease, unspecified: Secondary | ICD-10-CM | POA: Insufficient documentation

## 2013-04-11 DIAGNOSIS — I5022 Chronic systolic (congestive) heart failure: Secondary | ICD-10-CM

## 2013-04-11 MED ORDER — ISOSORBIDE MONONITRATE ER 30 MG PO TB24: 30.0000 mg | ORAL_TABLET | Freq: Every day | ORAL | Status: DC

## 2013-04-11 NOTE — Patient Instructions (Signed)
Start Imdur 30mg  once daily  Labs in 1 month (BMET)  Your physician recommends that you schedule a follow-up appointment in: 2 months  **DISCONTINUE IMDUR 24 HOURS PRIOR TO VIAGRA USE**

## 2013-04-12 NOTE — Progress Notes (Signed)
Patient ID: Bruce Cole, male   DOB: [birth year], 72 y.o.   MRN: [name]  Weight Range   Baseline proBNP    HPI: Bruce Cole is a 72 yo male with a history of chronic atrial fibrillation, HTN and arthritis. He has been on chronic coumadin therapy. He was recently diagnosed with systolic HF EF [id number] with diffuse HK, grade 2 diastolic dysfunction, RV mod dilated, moderate TR.  R/LHC 02/2013 RA = 28 with prominent v-waves  RV = 67/21/28  PA = 70/23 (42)  PCW = 29  Fick cardiac output/index = 7.5/2.8  PVR = 1.8 Woods  SVR = 610  FA sat = 96%  PA sat = 64%, 70%  No RV LV interaction  Near equalization of RV, LV and RA diastolic pressures ** Essentially normal coronary arteries**  Echo (9/14) reviewed today: EF 45-50% with grade II diastolic dysfunction, RV moderately to severely dilated with mildly decreased systolic function, D-shaped interventricular septum suggestive of RV pressure/volume overload, PASP 60 mmHg.   Follow up:  Feeling good. Denies SOB, orthopnea, PND or CP.  He can climb a flight of steps without problems.  Main issue now is back and hip pain, uses a cane for balance.  Weight is down 4 lbs. Taking medications as prescribed. Working out at [employer] 3x week for 2 hrs, and plays golf the other 2 days. Sleep study done but does not appear to have been reported yet.   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  Past Medical History  Diagnosis Date  . Nephrolithiasis   . Arthritis     rheumatoid  . Colon polyp 2000  . Atrial fibrillation   . Unspecified essential hypertension   . Gout     Current Outpatient Prescriptions  Medication Sig Dispense Refill  . amLODipine (NORVASC) 10 MG tablet Take 1 tablet (10 mg total) by mouth daily.  30 tablet  2  . carvedilol (COREG) 12.5 MG  tablet Take 0.5 tablets (6.25 mg total) by mouth 2 (two) times daily with a meal.  60 tablet  3  . furosemide (LASIX) 40 MG tablet Take 2 tablets (80 mg total) by mouth 2 (two) times daily.  120 tablet  1  . hydrALAZINE (APRESOLINE) 50 MG tablet TAKE 1 TABLET BY MOUTH THREE TIMES DAILY  90 tablet  3  . metolazone (ZAROXOLYN) 2.5 MG tablet Take one daily 30 min prior to your morning fluid pill dose 2 days a week.  10 tablet  11  . Multiple Vitamins-Minerals (PX MENS MULTIVITAMINS) TABS Take 1 tablet by mouth daily.      . ondansetron (ZOFRAN) 4 MG tablet Take 1 tablet (4 mg total) by mouth every 6 (six) hours as needed for nausea.  20 tablet  0  . potassium chloride SA (K-DUR,KLOR-CON) 20 MEQ tablet Take 1 tablet (20 mEq total) by mouth 3 (three) times daily.  90 tablet  11  . sildenafil (VIAGRA) 100 MG tablet Take 100 mg by mouth daily as needed for erectile dysfunction.       [name] spironolactone (ALDACTONE) 25 MG tablet Take 1 tablet (25 mg total) by mouth daily.  30 tablet  6  . terazosin (HYTRIN) 1 MG capsule TAKE ONE CAPSULE BY MOUTH EVERY NIGHT AT BEDTIME  30 capsule  2  . traMADol (ULTRAM) 50 MG tablet Take 50 mg by mouth Twice daily.      [name] warfarin (COUMADIN) 7.5 MG tablet Take as directed by coumadin clinic  40 tablet  3  . isosorbide mononitrate (IMDUR) 30 MG 24 hr tablet Take 1 tablet (30 mg total) by mouth daily.  30 tablet  6   No current facility-administered medications for this encounter.   PHYSICAL EXAM: Filed Vitals:   04/11/13 1006  BP: 124/68  Pulse: 49  Height: 6\' 2"  (1.88 m)  Weight: 277 lb 12.8 oz (126.009 kg)  SpO2: 100%   General:  Well appearing. No resp difficulty HEENT: normal Neck: supple. JVP 8-9 cm. Carotids 2+ bilaterally; no bruits. No lymphadenopathy or thryomegaly appreciated. Cor: PMI normal. Regular rate & rhythm. No rubs, gallops or murmurs. Lungs: clear Abdomen: soft, nontender, nondistended. No hepatosplenomegaly. No bruits or masses. Good bowel  sounds. Extremities: no cyanosis, clubbing, rash,  no edema Neuro: alert & orientedx3, cranial nerves grossly intact. Moves all 4 extremities w/o difficulty. Affect pleasant.  ASSESSMENT & PLAN:  1) Chronic systolic HF with RV dilation/dysfunction: Echo today showed EF 45-50% with grade II diastolic dysfunction, moderate to severe RV dilation with mild systolic dysfunction, PA systolic pressure 60 mmHg. Prominent RV involvement.  RHC/LHC in 8/14 with no CAD, suggestion of restrictive physiology, and pulmonary venous hypertension (low PVR).  NYHA I-II symptoms. Volume status improved, continues to lose weight. - Continue coreg 6.25 mg BID.  - Continue hydralazine 50 mg tid but will add Imdur 30 mg daily.  He was warned not to take Viagra while on Imdur (he has not been using).  - Continue current Lasix and metolazone (K and creatinine have been stable).  - Repeat BMET in 1 month.  2) HTN - Controlled on coreg, norvasc, hydralazine, and terazosin  3) Probable OSA: Still awaiting results of sleep study. Will call to pulmonary.   4) AF: Chronic, rate controlled. Continue coumadin. Have discussed NOAC and he is not interested.  5) CKD: Stable with current diuresis.  Follow creatinine closely.   Bruce Champagne MD 5:41 AM 04/12/2013

## 2013-04-15 ENCOUNTER — Other Ambulatory Visit: Payer: Self-pay | Admitting: Family

## 2013-04-15 DIAGNOSIS — I270 Primary pulmonary hypertension: Secondary | ICD-10-CM | POA: Diagnosis not present

## 2013-04-15 DIAGNOSIS — G4710 Hypersomnia, unspecified: Secondary | ICD-10-CM

## 2013-04-15 DIAGNOSIS — G4730 Sleep apnea, unspecified: Secondary | ICD-10-CM

## 2013-04-15 NOTE — Telephone Encounter (Signed)
eScribe request for refill on Allopurinol 100 mg Last filled - 07.01.14, #60x2 Last AEX - 08.25.14 Next AEX - 3 Months Medication shows d/c on 08.06.14 by [Provider: Candise Bowens, [facility] Department: [facility]] w/o reason. Please Advise/SLS

## 2013-04-16 ENCOUNTER — Ambulatory Visit (INDEPENDENT_AMBULATORY_CARE_PROVIDER_SITE_OTHER): Payer: Medicare Other | Admitting: *Deleted

## 2013-04-16 DIAGNOSIS — Z7901 Long term (current) use of anticoagulants: Secondary | ICD-10-CM | POA: Diagnosis not present

## 2013-04-16 DIAGNOSIS — I4891 Unspecified atrial fibrillation: Secondary | ICD-10-CM

## 2013-04-17 NOTE — Procedures (Signed)
NAMEJAYTEN, KISSAM              ACCOUNT NO.:  [id number]  MEDICAL RECORD NO.:  [id number]          PATIENT TYPE:  OUT  LOCATION:  SLEEP CENTER                 FACILITY:  [facility]  PHYSICIAN:  Kathee Delton, MD,FCCPDATE OF BIRTH:  Dec 13[birth year] 1942  DATE OF STUDY:  04/02/2013                           NOCTURNAL POLYSOMNOGRAM  REFERRING PHYSICIAN:  Shaune Pascal. Bensimhon, MD  INDICATION FOR STUDY:  Hypersomnia with sleep apnea.  EPWORTH SLEEPINESS SCORE:  5.  MEDICATIONS:  SLEEP ARCHITECTURE:  The patient had a total sleep time of 209 minutes with no slow-wave sleep and only 45 minutes of REM.  Sleep onset latency was prolonged at 72 minutes, and REM onset was prolonged at 185 minutes. Sleep efficiency was poor at 54%.  RESPIRATORY DATA:  The patient was found to have 23 apneas and 74 obstructive hypopneas, giving him an apnea-hypopnea index of 28 events per hour.  The events occurred in all body positions and there was moderate snoring noted throughout.  The patient did not meet split night protocol secondary to insufficient sleep, and the majority of these events occurring after 2 a.m.  OXYGEN DATA:  There was O2 desaturation as low as 77% with the patient's obstructive events.  CARDIAC DATA:  The patient was noted to be in atrial fibrillation with a controlled ventricular response.  He was also noted to have frequent PVCs.  MOVEMENT-PARASOMNIA:  The patient had moderate periodic limb movements with very little sleep disruption.  IMPRESSIONS-RECOMMENDATIONS: 1. Moderate obstructive sleep apnea with an AHI of 28 events per hour     and oxygen desaturation as low as 77%.  Treatment for this degree     of sleep apnea can include a trial of weight loss alone, upper     airway surgery, dental appliance, and also CPAP.  Clinical     correlation is suggested. 2. The patient was noted to have atrial fibrillation with a controlled     ventricular response, as well as frequent  PVCs.     Kathee Delton, MD,FCCP Diplomate, [place] Board of Sleep Medicine    [facility]MED[facility]  D:  04/16/2013 08:46:30  T:  04/17/2013 00:06:47  Job:  [id number]

## 2013-04-26 ENCOUNTER — Other Ambulatory Visit: Payer: Self-pay | Admitting: Family

## 2013-04-26 ENCOUNTER — Telehealth: Payer: Self-pay | Admitting: Family

## 2013-04-26 DIAGNOSIS — G4730 Sleep apnea, unspecified: Secondary | ICD-10-CM

## 2013-04-26 NOTE — Telephone Encounter (Addendum)
Please let patient know that his sleep study shows sleep apnea.  I think he will benefit from CPAP.  I will arrange through home health.  Patient should follow up in 6 weeks.

## 2013-05-01 NOTE — Telephone Encounter (Signed)
Left detailed message on home# and to call if any questions. 

## 2013-05-03 DIAGNOSIS — M5137 Other intervertebral disc degeneration, lumbosacral region: Secondary | ICD-10-CM | POA: Diagnosis not present

## 2013-05-03 DIAGNOSIS — M1710 Unilateral primary osteoarthritis, unspecified knee: Secondary | ICD-10-CM | POA: Diagnosis not present

## 2013-05-03 DIAGNOSIS — M069 Rheumatoid arthritis, unspecified: Secondary | ICD-10-CM | POA: Diagnosis not present

## 2013-05-03 DIAGNOSIS — M169 Osteoarthritis of hip, unspecified: Secondary | ICD-10-CM | POA: Diagnosis not present

## 2013-05-06 DIAGNOSIS — I739 Peripheral vascular disease, unspecified: Secondary | ICD-10-CM | POA: Diagnosis not present

## 2013-05-06 DIAGNOSIS — L84 Corns and callosities: Secondary | ICD-10-CM | POA: Diagnosis not present

## 2013-05-06 DIAGNOSIS — L608 Other nail disorders: Secondary | ICD-10-CM | POA: Diagnosis not present

## 2013-05-14 ENCOUNTER — Ambulatory Visit (INDEPENDENT_AMBULATORY_CARE_PROVIDER_SITE_OTHER): Payer: Medicare Other | Admitting: *Deleted

## 2013-05-14 DIAGNOSIS — I4891 Unspecified atrial fibrillation: Secondary | ICD-10-CM

## 2013-05-14 DIAGNOSIS — Z7901 Long term (current) use of anticoagulants: Secondary | ICD-10-CM | POA: Diagnosis not present

## 2013-05-19 ENCOUNTER — Other Ambulatory Visit: Payer: Self-pay | Admitting: Physician Assistant

## 2013-05-20 ENCOUNTER — Other Ambulatory Visit: Payer: Self-pay | Admitting: Physician Assistant

## 2013-06-03 ENCOUNTER — Encounter: Payer: Self-pay | Admitting: Family

## 2013-06-03 ENCOUNTER — Ambulatory Visit (INDEPENDENT_AMBULATORY_CARE_PROVIDER_SITE_OTHER): Payer: Medicare Other | Admitting: Family

## 2013-06-03 VITALS — BP 140/70 | HR 44 | Temp 98.0°F | Resp 16 | Ht 72.0 in | Wt 269.0 lb

## 2013-06-03 DIAGNOSIS — Z23 Encounter for immunization: Secondary | ICD-10-CM | POA: Diagnosis not present

## 2013-06-03 DIAGNOSIS — I10 Essential (primary) hypertension: Secondary | ICD-10-CM

## 2013-06-03 DIAGNOSIS — M069 Rheumatoid arthritis, unspecified: Secondary | ICD-10-CM | POA: Diagnosis not present

## 2013-06-03 DIAGNOSIS — I4891 Unspecified atrial fibrillation: Secondary | ICD-10-CM | POA: Diagnosis not present

## 2013-06-03 DIAGNOSIS — I509 Heart failure, unspecified: Secondary | ICD-10-CM

## 2013-06-03 DIAGNOSIS — N289 Disorder of kidney and ureter, unspecified: Secondary | ICD-10-CM

## 2013-06-03 NOTE — Patient Instructions (Signed)
Please continue your current medications. Follow up in 3 months.

## 2013-06-03 NOTE — Assessment & Plan Note (Addendum)
Stable, sinus brady today, follows with Cardiology for anticoagulation. Lab Results  Component Value Date   INR 3.1 05/14/2013   INR 2.8 04/16/2013   INR 2.7 03/19/2013     Continue coumadin at prescribed dose.

## 2013-06-03 NOTE — Assessment & Plan Note (Signed)
Clinically stable. Management per Rheumatology.

## 2013-06-03 NOTE — Assessment & Plan Note (Signed)
Clinically stable.  Management per renal- Dr. [name].

## 2013-06-03 NOTE — Progress Notes (Signed)
Subjective:    Patient ID: Bruce Cole, male    DOB: 27-Jun-1941, 72 y.o.   MRN: [name]  HPI Bruce Cole is a 72 y/o male who presents today for follow up.  1) HTN: Patient report he checks his blood pressure at home, running 130's/80's; denies chest pain. Taking terazosin, carvedilol, amlodipine. BP today 140/70. Patient reports a decrease in dizziness due to taking BP medications at night. Last visit carvedilol was decreased due to bradycardia, HR today upon recheck is 56.   2) CHF: Plays golf 3 days a week, works out at [employer] 2 days a week, no change in activity level, stable on lasix, denies weight gain and edema to lower extremities, denies shortness of breath.  3) AFIB: Maintained on Coumadin, reports intermittent palpitations  4) CKD: Patient sees Dr. [name] Lab Results  Component Value Date   CREATININE 1.6* 04/05/2013   5) RA- follows with Dr. Estanislado Pandy.  Reports that his symptoms are well controlled.   Review of Systems  Constitutional: Negative for fever, appetite change and unexpected weight change.  HENT: Negative for rhinorrhea and sore throat.   Respiratory: Negative for chest tightness and shortness of breath.   Cardiovascular: Negative for chest pain.  Gastrointestinal: Negative for nausea and vomiting.  Genitourinary: Negative for dysuria.  Musculoskeletal: Positive for arthralgias.       Follows with Dr. Estanislado Pandy  Neurological: Negative for dizziness and headaches.   Past Medical History  Diagnosis Date  . Nephrolithiasis   . Arthritis     rheumatoid  . Colon polyp 2000  . Atrial fibrillation   . Unspecified essential hypertension   . Gout     History   Social History  . Marital Status: Single    Spouse Name: N/A    Number of Children: 3  . Years of Education: N/A   Occupational History  .     Social History Main Topics  . Smoking status: Never Smoker   . Smokeless tobacco: Never Used  . Alcohol Use: Yes     Comment: occasional   . Drug Use: No  . Sexual Activity: Not on file   Other Topics Concern  . 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 Spine surgery      x 2  . Joint replacement      Total L-Hip replacement, Right Knee 10/20/09  . Cholecystectomy  1994    Family History  Problem Relation Age of Onset  . Hypertension Mother   . Arthritis Mother     ?RA  . Hypertension Father     No Known Allergies  Current Outpatient Prescriptions on File Prior to Visit  Medication Sig Dispense Refill  . allopurinol (ZYLOPRIM) 100 MG tablet TAKE 2 TABLETS BY MOUTH EVERY DAY  60 tablet  5  . amLODipine (NORVASC) 10 MG tablet TAKE 1 TABLET BY MOUTH EVERY DAY  30 tablet  2  . carvedilol (COREG) 12.5 MG tablet Take 0.5 tablets (6.25 mg total) by mouth 2 (two) times daily with a meal.  60 tablet  3  . furosemide (LASIX) 40 MG tablet TAKE 2 TABLETS BY MOUTH TWICE DAILY  120 tablet  1  . hydrALAZINE (APRESOLINE) 50 MG tablet TAKE 1 TABLET BY MOUTH THREE TIMES DAILY  90 tablet  3  . isosorbide mononitrate (IMDUR) 30 MG 24 hr tablet Take 1 tablet (  30 mg total) by mouth daily.  30 tablet  6  . metolazone (ZAROXOLYN) 2.5 MG tablet Take one daily 30 min prior to your morning fluid pill dose 2 days a week.  10 tablet  11  . Multiple Vitamins-Minerals (PX MENS MULTIVITAMINS) TABS Take 1 tablet by mouth daily.      . ondansetron (ZOFRAN) 4 MG tablet Take 1 tablet (4 mg total) by mouth every 6 (six) hours as needed for nausea.  20 tablet  0  . potassium chloride SA (K-DUR,KLOR-CON) 20 MEQ tablet Take 1 tablet (20 mEq total) by mouth 3 (three) times daily.  90 tablet  11  . sildenafil (VIAGRA) 100 MG tablet Take 100 mg by mouth daily as needed for erectile dysfunction.       [name] spironolactone (ALDACTONE) 25 MG tablet Take 1 tablet (25 mg total) by mouth daily.  30 tablet  6  . terazosin (HYTRIN) 1 MG capsule TAKE ONE CAPSULE BY MOUTH  EVERY NIGHT AT BEDTIME  30 capsule  2  . traMADol (ULTRAM) 50 MG tablet Take 50 mg by mouth Twice daily.      [name] warfarin (COUMADIN) 7.5 MG tablet Take as directed by coumadin clinic  40 tablet  3   No current facility-administered medications on file prior to visit.    BP 140/70  Pulse 44  Temp(Src) 98 F (36.7 C) (Oral)  Resp 16  Ht 6' (1.829 m)  Wt 269 lb (122.018 kg)  BMI 36.48 kg/m2  SpO2 96%       Objective:   Physical Exam  Constitutional: He is oriented to person, place, and time. He appears well-nourished.  Cardiovascular: Normal heart sounds and intact distal pulses.   No murmur heard. HR 56, patient normally bradycardic.  Pulmonary/Chest: Breath sounds normal. No respiratory distress.  Neurological: He is alert and oriented to person, place, and time.  Skin: Skin is warm and dry. No rash noted.  Psychiatric: He has a normal mood and affect.          Assessment & Plan:

## 2013-06-03 NOTE — Assessment & Plan Note (Signed)
Euvolemic today on current meds.  Continue same. His weight is down significantly.  Wt Readings from Last 3 Encounters:  06/03/13 269 lb (122.018 kg)  04/11/13 277 lb 12.8 oz (126.009 kg)  04/02/13 275 lb (124.739 kg)

## 2013-06-03 NOTE — Assessment & Plan Note (Signed)
Blood pressure 140/70 in office today, HR 56 and stable.  Continue carvedilol at 6.25mg  and continue current antihypertensives.    Follow up in 3 months.

## 2013-06-11 ENCOUNTER — Ambulatory Visit (INDEPENDENT_AMBULATORY_CARE_PROVIDER_SITE_OTHER): Payer: Medicare Other | Admitting: *Deleted

## 2013-06-11 ENCOUNTER — Telehealth (HOSPITAL_COMMUNITY): Payer: Self-pay | Admitting: Adult Health

## 2013-06-11 ENCOUNTER — Encounter (HOSPITAL_COMMUNITY): Payer: Self-pay

## 2013-06-11 ENCOUNTER — Ambulatory Visit (HOSPITAL_COMMUNITY)
Admission: RE | Admit: 2013-06-11 | Discharge: 2013-06-11 | Disposition: A | Discharge status: Home / Self Care | Payer: Medicare Other | Source: Ambulatory Visit | Attending: Internal Medicine | Admitting: Internal Medicine

## 2013-06-11 VITALS — BP 144/72 | HR 46 | Wt 272.4 lb

## 2013-06-11 DIAGNOSIS — N189 Chronic kidney disease, unspecified: Secondary | ICD-10-CM | POA: Diagnosis not present

## 2013-06-11 DIAGNOSIS — I5022 Chronic systolic (congestive) heart failure: Secondary | ICD-10-CM | POA: Diagnosis not present

## 2013-06-11 DIAGNOSIS — I129 Hypertensive chronic kidney disease with stage 1 through stage 4 chronic kidney disease, or unspecified chronic kidney disease: Secondary | ICD-10-CM | POA: Insufficient documentation

## 2013-06-11 DIAGNOSIS — J150 Pneumonia due to Klebsiella pneumoniae: Secondary | ICD-10-CM | POA: Diagnosis not present

## 2013-06-11 DIAGNOSIS — I5042 Chronic combined systolic (congestive) and diastolic (congestive) heart failure: Secondary | ICD-10-CM

## 2013-06-11 DIAGNOSIS — I4891 Unspecified atrial fibrillation: Secondary | ICD-10-CM

## 2013-06-11 DIAGNOSIS — Z7901 Long term (current) use of anticoagulants: Secondary | ICD-10-CM | POA: Diagnosis not present

## 2013-06-11 DIAGNOSIS — I10 Essential (primary) hypertension: Secondary | ICD-10-CM

## 2013-06-11 DIAGNOSIS — N289 Disorder of kidney and ureter, unspecified: Secondary | ICD-10-CM

## 2013-06-11 LAB — BASIC METABOLIC PANEL
CO2: 28 mEq/L (ref 19–32)
Calcium: 9.8 mg/dL (ref 8.4–10.5)
Creatinine, Ser: 1.54 mg/dL — ABNORMAL HIGH (ref 0.50–1.35)

## 2013-06-11 LAB — POCT INR: INR: 2.5

## 2013-06-11 NOTE — Patient Instructions (Signed)
Follow up in 3 months   Do the following things EVERYDAY: 1) Weigh yourself in the morning before breakfast. Write it down and keep it in a log. 2) Take your medicines as prescribed 3) Eat low salt foods-Limit salt (sodium) to 2000 mg per day.  4) Stay as active as you can everyday 5) Limit all fluids for the day to less than 2 liters  

## 2013-06-11 NOTE — Telephone Encounter (Signed)
Left message regarding lab work.  Renal function stable. No changes required at this time.   CLEGG,AMY 4:03 PM

## 2013-06-11 NOTE — Progress Notes (Signed)
Patient ID: Bruce Cole, male   DOB: [birth year], 72 y.o.   MRN: [name]   Weight Range   Baseline proBNP    HPI: Bruce Cole is a 72 yo male with a history of chronic atrial fibrillation, HTN and arthritis. He has been on chronic coumadin therapy. Diagnosed with systolic [id number]  HF EF [id number] with diffuse HK, grade 2 diastolic dysfunction, RV mod dilated, moderate TR.  R/LHC 02/2013 RA = 28 with prominent v-waves  RV = 67/21/28  PA = 70/23 (42)  PCW = 29  Fick cardiac output/index = 7.5/2.8  PVR = 1.8 Woods  SVR = 610  FA sat = 96%  PA sat = 64%, 70%  No RV LV interaction  Near equalization of RV, LV and RA diastolic pressures ** Essentially normal coronary arteries**  Echo (9/14) reviewed today: EF 45-50% with grade II diastolic dysfunction, RV moderately to severely dilated with mildly decreased systolic function, D-shaped interventricular septum suggestive of RV pressure/volume overload, PASP 60 mmHg  ECHO 04/11/13 EF 45-50%    He returns for follow up. Denies SOB/PND/Orthopnea. Weight at home 268-272 pounds. Exercises 3 x a week on stationary bike about 1 hour which is about 8 miles. Plays golk 2 days a week. Compliant with medications. Plan for CPAP fitting today. Tries to follow low salt diet.    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  Past Medical History  Diagnosis Date  . Nephrolithiasis   . Arthritis     rheumatoid  . Colon polyp 2000  . Atrial fibrillation   . Unspecified essential hypertension   . Gout     Current Outpatient Prescriptions  Medication Sig Dispense Refill  . allopurinol (ZYLOPRIM) 100 MG tablet TAKE 2 TABLETS BY MOUTH EVERY DAY  60 tablet  5  . amLODipine (NORVASC) 10 MG tablet  TAKE 1 TABLET BY MOUTH EVERY DAY  30 tablet  2  . carvedilol (COREG) 12.5 MG tablet Take 0.5 tablets (6.25 mg total) by mouth 2 (two) times daily with a meal.  60 tablet  3  . furosemide (LASIX) 40 MG tablet TAKE 2 TABLETS BY MOUTH TWICE DAILY  120 tablet  1  . hydrALAZINE (APRESOLINE) 50 MG tablet TAKE 1 TABLET BY MOUTH THREE TIMES DAILY  90 tablet  3  . isosorbide mononitrate (IMDUR) 30 MG 24 hr tablet Take 1 tablet (30 mg total) by mouth daily.  30 tablet  6  . metolazone (ZAROXOLYN) 2.5 MG tablet Take one daily 30 min prior to your morning fluid pill dose 2 days a week.  10 tablet  11  . Multiple Vitamins-Minerals (PX MENS MULTIVITAMINS) TABS Take 1 tablet by mouth daily.      . ondansetron (ZOFRAN) 4 MG tablet Take 1 tablet (4 mg total) by mouth every 6 (six) hours as needed for nausea.  20 tablet  0  . potassium chloride SA (K-DUR,KLOR-CON) 20 MEQ tablet Take 1 tablet (20 mEq total) by mouth 3 (three) times daily.  90 tablet  11  . sildenafil (VIAGRA) 100 MG tablet Take 100 mg by mouth daily as needed for erectile dysfunction.       [name] spironolactone (ALDACTONE) 25 MG tablet Take 1 tablet (  25 mg total) by mouth daily.  30 tablet  6  . terazosin (HYTRIN) 1 MG capsule TAKE ONE CAPSULE BY MOUTH EVERY NIGHT AT BEDTIME  30 capsule  2  . traMADol (ULTRAM) 50 MG tablet Take 50 mg by mouth Twice daily.      [name] warfarin (COUMADIN) 7.5 MG tablet Take as directed by coumadin clinic  40 tablet  3   No current facility-administered medications for this encounter.   PHYSICAL EXAM: Filed Vitals:   06/11/13 0944  BP: 144/72  Pulse: 46  Weight: 272 lb 6.4 oz (123.56 kg)  SpO2: 99%   General:  Well appearing. No resp difficulty HEENT: normal Neck: supple. JVP 6-7 cm. Carotids 2+ bilaterally; no bruits. No lymphadenopathy or thryomegaly appreciated. Cor: PMI normal. Regular rate & rhythm. No rubs, gallops or murmurs. Lungs: clear Abdomen: soft, nontender, nondistended. No hepatosplenomegaly. No bruits  or masses. Good bowel sounds. Extremities: no cyanosis, clubbing, rash,  no edema Neuro: alert & orientedx3, cranial nerves grossly intact. Moves all 4 extremities w/o difficulty. Affect pleasant.  ASSESSMENT & PLAN:  1) Chronic systolic HF with RV dilation/dysfunction: Echo 03/2013 EF 45-50% with grade II diastolic dysfunction, moderate to severe RV dilation with mild systolic dysfunction, PA systolic pressure 60 mmHg. Prominent RV involvement.  RHC/LHC in 8/14 with no CAD, suggestion of restrictive physiology, and pulmonary venous hypertension (low PVR).  NYHA II symptoms. Volume status stable. Continue lasix 80 mg bid and metolazone twice a week. Instructed to take an additional 40 mg of lasix if weight is 275 pounds or greater.  - Continue coreg 6.25 mg BID.  - Continue hydralazine 50 mg tid but will add Imdur 30 mg daily.  He understands he should not use Viagra while on Imdur (he has not been using).  - Continue lasix 80 mg bid and metolazone twice a week.  - Repeat BMET today. ---> K 3.7 Creatinine 1.5  2) HTN - Controlled on coreg, norvasc, hydralazine, and terazosin 3) OAS: Plans to get CPAP today.   4) AF: Chronic, rate controlled. Continue coumadin. No interested in NOAC.  5) CKD: Baseline 1.5-1.7Stable with current diuresis. K 3.7 Creatinine 1.5    Follow up in 3 months   Taneesha Edgin NP-C  9:54 AM 06/11/2013

## 2013-06-20 ENCOUNTER — Other Ambulatory Visit: Payer: Self-pay | Admitting: Family

## 2013-06-20 NOTE — Telephone Encounter (Signed)
Rx request to pharmacy/SLS  

## 2013-07-09 ENCOUNTER — Ambulatory Visit (INDEPENDENT_AMBULATORY_CARE_PROVIDER_SITE_OTHER): Payer: Medicare Other | Admitting: *Deleted

## 2013-07-09 DIAGNOSIS — Z7901 Long term (current) use of anticoagulants: Secondary | ICD-10-CM

## 2013-07-09 DIAGNOSIS — I4891 Unspecified atrial fibrillation: Secondary | ICD-10-CM | POA: Diagnosis not present

## 2013-07-09 LAB — POCT INR: INR: 2

## 2013-07-18 ENCOUNTER — Other Ambulatory Visit: Payer: Self-pay | Admitting: Family

## 2013-08-06 ENCOUNTER — Ambulatory Visit (INDEPENDENT_AMBULATORY_CARE_PROVIDER_SITE_OTHER): Payer: Medicare Other | Admitting: *Deleted

## 2013-08-06 DIAGNOSIS — Z7901 Long term (current) use of anticoagulants: Secondary | ICD-10-CM

## 2013-08-06 DIAGNOSIS — Z5181 Encounter for therapeutic drug level monitoring: Secondary | ICD-10-CM

## 2013-08-06 DIAGNOSIS — I4891 Unspecified atrial fibrillation: Secondary | ICD-10-CM

## 2013-08-06 LAB — POCT INR: INR: 3.2

## 2013-08-10 ENCOUNTER — Other Ambulatory Visit: Payer: Self-pay | Admitting: Family

## 2013-08-15 ENCOUNTER — Other Ambulatory Visit: Payer: Self-pay | Admitting: Physician Assistant

## 2013-08-15 ENCOUNTER — Other Ambulatory Visit: Payer: Self-pay | Admitting: Family

## 2013-08-15 NOTE — Telephone Encounter (Signed)
Rx request to pharmacy/SLS  

## 2013-08-17 ENCOUNTER — Other Ambulatory Visit: Payer: Self-pay | Admitting: Physician Assistant

## 2013-08-19 NOTE — Telephone Encounter (Signed)
Rx request to pharmacy/SLS  

## 2013-09-02 ENCOUNTER — Telehealth (HOSPITAL_COMMUNITY): Payer: Self-pay

## 2013-09-02 ENCOUNTER — Encounter (HOSPITAL_COMMUNITY): Payer: Self-pay

## 2013-09-02 ENCOUNTER — Ambulatory Visit (HOSPITAL_COMMUNITY)
Admission: RE | Admit: 2013-09-02 | Discharge: 2013-09-02 | Disposition: A | Discharge status: Home / Self Care | Payer: Medicare Other | Source: Ambulatory Visit | Attending: Internal Medicine | Admitting: Internal Medicine

## 2013-09-02 ENCOUNTER — Ambulatory Visit (INDEPENDENT_AMBULATORY_CARE_PROVIDER_SITE_OTHER): Payer: Medicare Other | Admitting: *Deleted

## 2013-09-02 VITALS — BP 135/81 | HR 63 | Wt 278.8 lb

## 2013-09-02 DIAGNOSIS — Z9989 Dependence on other enabling machines and devices: Secondary | ICD-10-CM

## 2013-09-02 DIAGNOSIS — Z7901 Long term (current) use of anticoagulants: Secondary | ICD-10-CM

## 2013-09-02 DIAGNOSIS — I509 Heart failure, unspecified: Secondary | ICD-10-CM | POA: Insufficient documentation

## 2013-09-02 DIAGNOSIS — I4891 Unspecified atrial fibrillation: Secondary | ICD-10-CM

## 2013-09-02 DIAGNOSIS — G4733 Obstructive sleep apnea (adult) (pediatric): Secondary | ICD-10-CM | POA: Insufficient documentation

## 2013-09-02 DIAGNOSIS — I5022 Chronic systolic (congestive) heart failure: Secondary | ICD-10-CM | POA: Insufficient documentation

## 2013-09-02 DIAGNOSIS — I5032 Chronic diastolic (congestive) heart failure: Secondary | ICD-10-CM | POA: Diagnosis not present

## 2013-09-02 DIAGNOSIS — Z5181 Encounter for therapeutic drug level monitoring: Secondary | ICD-10-CM | POA: Diagnosis not present

## 2013-09-02 HISTORY — DX: Obstructive sleep apnea (adult) (pediatric): G47.33

## 2013-09-02 LAB — BASIC METABOLIC PANEL
BUN: 49 mg/dL — ABNORMAL HIGH (ref 6–23)
CO2: 30 meq/L (ref 19–32)
Calcium: 10.3 mg/dL (ref 8.4–10.5)
Chloride: 95 mEq/L — ABNORMAL LOW (ref 96–112)
Creatinine, Ser: 1.71 mg/dL — ABNORMAL HIGH (ref 0.50–1.35)
GFR calc Af Amer: 45 mL/min — ABNORMAL LOW (ref >90)
GFR calc non Af Amer: 39 mL/min — ABNORMAL LOW (ref >90)
Glucose, Bld: 120 mg/dL — ABNORMAL HIGH (ref 70–99)
Potassium: 4.2 mEq/L (ref 3.7–5.3)
Sodium: 139 mEq/L (ref 137–147)

## 2013-09-02 LAB — POCT INR: INR: 2.4

## 2013-09-02 NOTE — Patient Instructions (Signed)
Follow up in 6 months  Dont take Imdur if you are taking Viagra  Do the following things EVERYDAY: 1) Weigh yourself in the morning before breakfast. Write it down and keep it in a log. 2) Take your medicines as prescribed 3) Eat low salt foods-Limit salt (sodium) to 2000 mg per day.  4) Stay as active as you can everyday 5) Limit all fluids for the day to less than 2 liters

## 2013-09-02 NOTE — Progress Notes (Signed)
Patient ID: Bruce Cole, male   DOB: 03-May-1941, 73 y.o.   MRN: [name]   Weight Range   Baseline proBNP    HPI: Bruce Cole is a 73 yo male with a history of chronic atrial fibrillation, HTN and arthritis. He has been on chronic coumadin therapy. Diagnosed with systolic [id number]  HF EF [id number] with diffuse HK, grade 2 diastolic dysfunction, RV mod dilated, moderate TR.  R/LHC 02/2013 RA = 28 with prominent v-waves  RV = 67/21/28  PA = 70/23 (42)  PCW = 29  Fick cardiac output/index = 7.5/2.8  PVR = 1.8 [place]  SVR = 610  FA sat = 96%  PA sat = 64%, 70%  No RV LV interaction  Near equalization of RV, LV and RA diastolic pressures ** Essentially normal coronary arteries**  Echo (9/14) reviewed today: EF 45-50% with grade II diastolic dysfunction, RV moderately to severely dilated with mildly decreased systolic function, D-shaped interventricular septum suggestive of RV pressure/volume overload, PASP 60 mmHg  ECHO 04/11/13 EF 45-50%    He returns for follow up. Last visit was added imdur and metolazone twice a week. Weight at home 270-273 ponds. Denies SOB/PND/Orthopnea. Exercises 4x a week on stationary bike about 1 hour which is about 8 miles. Plays golf 2 days a week. Compliant with medications. Plan for CPAP fitting today. Tries to follow low salt diet. Using CPAP.    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  Past Medical History  Diagnosis Date  . Nephrolithiasis   . Arthritis     rheumatoid  . Colon polyp 2000  . Atrial fibrillation   . Unspecified essential hypertension   . Gout     Current Outpatient Prescriptions  Medication Sig Dispense Refill  . allopurinol (ZYLOPRIM) 100 MG tablet TAKE 2 TABLETS BY  MOUTH EVERY DAY  60 tablet  5  . amLODipine (NORVASC) 10 MG tablet TAKE 1 TABLET BY MOUTH DAILY  30 tablet  2  . carvedilol (COREG) 12.5 MG tablet Take 0.5 tablets (6.25 mg total) by mouth 2 (two) times daily with a meal.  60 tablet  3  . furosemide (LASIX) 40 MG tablet TAKE 2 TABLETS BY MOUTH TWICE DAILY  120 tablet  0  . hydrALAZINE (APRESOLINE) 50 MG tablet TAKE 1 TABLET BY MOUTH THREE TIMES DAILY  90 tablet  3  . isosorbide mononitrate (IMDUR) 30 MG 24 hr tablet Take 1 tablet (30 mg total) by mouth daily.  30 tablet  6  . metolazone (ZAROXOLYN) 2.5 MG tablet Take one daily 30 min prior to your morning fluid pill dose 2 days a week.  10 tablet  11  . Multiple Vitamins-Minerals (PX MENS MULTIVITAMINS) TABS Take 1 tablet by mouth daily.      . ondansetron (ZOFRAN) 4 MG tablet Take 1 tablet (4 mg total) by mouth every 6 (six) hours as needed for nausea.  20 tablet  0  . potassium chloride SA (K-DUR,KLOR-CON) 20 MEQ tablet Take 1 tablet (20 mEq total) by mouth 3 (three) times daily.  90 tablet  11  . sildenafil (VIAGRA) 100 MG tablet Take 100 mg by mouth daily as needed for erectile dysfunction.       [name]  spironolactone (ALDACTONE) 25 MG tablet Take 1 tablet (25 mg total) by mouth daily.  30 tablet  6  . terazosin (HYTRIN) 1 MG capsule TAKE 1 CAPSULE BY MOUTH EVERY NIGHT AT BEDTIME  30 capsule  2  . traMADol (ULTRAM) 50 MG tablet Take 50 mg by mouth Twice daily.      [name] warfarin (COUMADIN) 7.5 MG tablet Take as directed by coumadin clinic  40 tablet  3   No current facility-administered medications for this encounter.   PHYSICAL EXAM: Filed Vitals:   09/02/13 0936  BP: 135/81  Pulse: 63  Weight: 278 lb 12.8 oz (126.463 kg)  SpO2: 97%   General:  Well appearing. No resp difficulty. Ambulated into clinic with a cane.  HEENT: normal Neck: supple. JVP 6-7 cm. Carotids 2+ bilaterally; no bruits. No lymphadenopathy or thryomegaly appreciated. Cor: PMI normal. Regular rate & rhythm. No rubs,  gallops or murmurs. Lungs: clear Abdomen: soft, nontender, nondistended. No hepatosplenomegaly. No bruits or masses. Good bowel sounds. Extremities: no cyanosis, clubbing, rash,  no edema Neuro: alert & orientedx3, cranial nerves grossly intact. Moves all 4 extremities w/o difficulty. Affect pleasant.  ASSESSMENT & PLAN:  1) Chronic systolic HF with RV dilation/dysfunction: Echo 03/2013 EF 45-50% with grade II diastolic dysfunction, moderate to severe RV dilation with mild systolic dysfunction, PA systolic pressure 60 mmHg. Prominent RV involvement.  RHC/LHC in 8/14 with no CAD, suggestion of restrictive physiology, and pulmonary venous hypertension (low PVR).  NYHA I symptoms. Volume status stable. Continue lasix 80 mg bid and metolazone twice a week. Instructed to take an additional 40 mg of lasix if weight is 275 pounds or greater.  - Continue coreg 6.25 mg BID.  - Continue hydralazine 50 mg tid and Imdur 30 mg daily.  He understands he should not use Viagra while on Imdur . - Repeat BMET today. ---> K 4.2  Creatinine 1.7 2) OAS: Plans to get CPAP repaired today.  3) AF: Chronic, rate controlled. Continue coumadin. Not interested in NOAC.     Follow up in 6 months   CLEGG,AMY NP-C  9:52 AM 09/02/2013

## 2013-09-02 NOTE — Telephone Encounter (Signed)
Patient called and made aware of stable lab results from today's visit.  Instructed to call with any questions/concerns. Renee Pain

## 2013-09-03 ENCOUNTER — Encounter (HOSPITAL_COMMUNITY): Payer: Medicare Other

## 2013-09-03 ENCOUNTER — Ambulatory Visit: Payer: Medicare Other | Admitting: Family

## 2013-09-10 ENCOUNTER — Encounter: Payer: Self-pay | Admitting: Family

## 2013-09-10 ENCOUNTER — Ambulatory Visit (INDEPENDENT_AMBULATORY_CARE_PROVIDER_SITE_OTHER): Payer: Medicare Other | Admitting: Family

## 2013-09-10 VITALS — BP 116/68 | HR 49 | Temp 98.1°F | Resp 16 | Ht 72.0 in | Wt 281.0 lb

## 2013-09-10 DIAGNOSIS — I10 Essential (primary) hypertension: Secondary | ICD-10-CM | POA: Diagnosis not present

## 2013-09-10 DIAGNOSIS — I4891 Unspecified atrial fibrillation: Secondary | ICD-10-CM

## 2013-09-10 DIAGNOSIS — M069 Rheumatoid arthritis, unspecified: Secondary | ICD-10-CM

## 2013-09-10 DIAGNOSIS — I5042 Chronic combined systolic (congestive) and diastolic (congestive) heart failure: Secondary | ICD-10-CM | POA: Diagnosis not present

## 2013-09-10 DIAGNOSIS — N289 Disorder of kidney and ureter, unspecified: Secondary | ICD-10-CM

## 2013-09-10 NOTE — Assessment & Plan Note (Signed)
Sees Dr. Estanislado Pandy.  Clinically stable.  Manages pain with prn tramadol.

## 2013-09-10 NOTE — Assessment & Plan Note (Signed)
Stable on current meds.  Management per cardiology.

## 2013-09-10 NOTE — Progress Notes (Signed)
Pre visit review using our clinic review tool, if applicable. No additional management support is needed unless otherwise documented below in the visit note. 

## 2013-09-10 NOTE — Progress Notes (Signed)
Subjective:    Patient ID: Bruce Cole, male    DOB: 1941-07-07, 73 y.o.   MRN: [name]  HPI  Bruce Cole is a 73 yr old male who presents today for follow up.  HTN- Denies CP/sob, denies CP BP Readings from Last 3 Encounters:  09/10/13 116/68  09/02/13 135/81  06/11/13 144/72   CHF- reports no significant swelling.   Wt Readings from Last 3 Encounters:  09/10/13 281 lb (127.461 kg)  09/02/13 278 lb 12.8 oz (126.463 kg)  06/11/13 272 lb 6.4 oz (123.56 kg)   AF-  Lab Results  Component Value Date   INR 2.4 09/02/2013   INR 3.2 08/06/2013   INR 2.0 07/09/2013   RA- Sees Bruce Cole.  Last visit was 2 months.  Reports that he is using tramadol prn for pain.    CKD- sees Bruce Cole, last visit 2 months ago.   Lab Results  Component Value Date   CREATININE 1.71* 09/02/2013       Review of Systems See HPI  Past Medical History  Diagnosis Date  . Nephrolithiasis   . Arthritis     rheumatoid  . Colon polyp 2000  . Atrial fibrillation   . Unspecified essential hypertension   . Gout     History   Social History  . Marital Status: Single    Spouse Name: N/A    Number of Children: 3  . Years of Education: N/A   Occupational History  .     Social History Main Topics  . Smoking status: Never Smoker   . Smokeless tobacco: Never Used  . Alcohol Use: Yes     Comment: occasional  . Drug Use: No  . Sexual Activity: Not on file   Other Topics Concern  . 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 Spine surgery      x 2  . Joint replacement      Total L-Hip replacement, Right Knee 10/20/09  . Cholecystectomy  1994    Family History  Problem Relation Age of Onset  . Hypertension Mother   . Arthritis Mother     ?RA  . Hypertension Father     No Known Allergies  Current Outpatient Prescriptions on File Prior to Visit  Medication Sig Dispense  Refill  . allopurinol (ZYLOPRIM) 100 MG tablet TAKE 2 TABLETS BY MOUTH EVERY DAY  60 tablet  5  . amLODipine (NORVASC) 10 MG tablet TAKE 1 TABLET BY MOUTH DAILY  30 tablet  2  . carvedilol (COREG) 12.5 MG tablet Take 0.5 tablets (6.25 mg total) by mouth 2 (two) times daily with a meal.  60 tablet  3  . furosemide (LASIX) 40 MG tablet TAKE 2 TABLETS BY MOUTH TWICE DAILY  120 tablet  0  . hydrALAZINE (APRESOLINE) 50 MG tablet TAKE 1 TABLET BY MOUTH THREE TIMES DAILY  90 tablet  3  . isosorbide mononitrate (IMDUR) 30 MG 24 hr tablet Take 1 tablet (30 mg total) by mouth daily.  30 tablet  6  . metolazone (ZAROXOLYN) 2.5 MG tablet Take one daily 30 min prior to your morning fluid pill dose 2 days a week.  10 tablet  11  . Multiple Vitamins-Minerals (PX MENS MULTIVITAMINS) TABS Take 1 tablet by mouth daily.      . ondansetron (ZOFRAN) 4 MG tablet Take 1 tablet (  4 mg total) by mouth every 6 (six) hours as needed for nausea.  20 tablet  0  . potassium chloride SA (K-DUR,KLOR-CON) 20 MEQ tablet Take 1 tablet (20 mEq total) by mouth 3 (three) times daily.  90 tablet  11  . sildenafil (VIAGRA) 100 MG tablet Take 100 mg by mouth daily as needed for erectile dysfunction.       [name] spironolactone (ALDACTONE) 25 MG tablet Take 1 tablet (25 mg total) by mouth daily.  30 tablet  6  . terazosin (HYTRIN) 1 MG capsule TAKE 1 CAPSULE BY MOUTH EVERY NIGHT AT BEDTIME  30 capsule  2  . traMADol (ULTRAM) 50 MG tablet Take 50 mg by mouth Twice daily.      [name] warfarin (COUMADIN) 7.5 MG tablet Take as directed by coumadin clinic  40 tablet  3   No current facility-administered medications on file prior to visit.    BP 116/68  Pulse 49  Temp(Src) 98.1 F (36.7 C) (Oral)  Resp 16  Ht 6' (1.829 m)  Wt 281 lb (127.461 kg)  BMI 38.10 kg/m2  SpO2 97%       Objective:   Physical Exam  Constitutional: He is oriented to person, place, and time. He appears well-developed and well-nourished. No distress.  HENT:  Head:  Normocephalic and atraumatic.  Cardiovascular: Normal rate and regular rhythm.   No murmur heard. Pulmonary/Chest: Effort normal and breath sounds normal. No respiratory distress.  Musculoskeletal: He exhibits no edema.  Neurological: He is alert and oriented to person, place, and time.  Psychiatric: He has a normal mood and affect. His behavior is normal. Judgment and thought content normal.          Assessment & Plan:

## 2013-09-10 NOTE — Assessment & Plan Note (Signed)
BP stable on current meds. Continue same.  

## 2013-09-10 NOTE — Patient Instructions (Signed)
Please schedule a follow up appointment in 3 months.

## 2013-09-10 NOTE — Assessment & Plan Note (Signed)
Creatinine stable. Follows with Dr. [name].

## 2013-09-10 NOTE — Assessment & Plan Note (Signed)
He is chronically bradycardic and asymptomatic. Rate stable, INR therapeutic and managed by coumadin clinic.

## 2013-09-11 ENCOUNTER — Telehealth: Payer: Self-pay | Admitting: Family

## 2013-09-11 NOTE — Telephone Encounter (Signed)
Relevant patient education mailed to patient.  

## 2013-09-13 ENCOUNTER — Other Ambulatory Visit: Payer: Self-pay | Admitting: Family

## 2013-09-13 ENCOUNTER — Other Ambulatory Visit: Payer: Self-pay | Admitting: Internal Medicine

## 2013-09-17 ENCOUNTER — Telehealth: Payer: Self-pay | Admitting: Family

## 2013-09-17 MED ORDER — ONDANSETRON HCL 4 MG PO TABS: 4.0000 mg | ORAL_TABLET | Freq: Four times a day (QID) | ORAL | Status: DC | PRN

## 2013-09-17 NOTE — Telephone Encounter (Signed)
Spoke to pt. Reports that he is having nausea, abdominal cramping and diarrhea.Reports tolerating fluids. Taking imodiim. Advised that he cont imodium, continue fluids, call if symptoms worsen, or if not improved in 24 hours. Pt verbalizes understanding. rx send for prn zofran to his pharmacy.

## 2013-09-17 NOTE — Telephone Encounter (Signed)
His grandson had a stomach virus and Mr Bruce Cole thinks he has gotten it.  He has stomach cramps, diarrhea, nausea but no vomiting.  Would like [name] to call him in something to soothe his stomach

## 2013-09-30 ENCOUNTER — Ambulatory Visit (INDEPENDENT_AMBULATORY_CARE_PROVIDER_SITE_OTHER): Payer: Medicare Other

## 2013-09-30 DIAGNOSIS — Z5181 Encounter for therapeutic drug level monitoring: Secondary | ICD-10-CM | POA: Diagnosis not present

## 2013-09-30 DIAGNOSIS — Z7901 Long term (current) use of anticoagulants: Secondary | ICD-10-CM | POA: Diagnosis not present

## 2013-09-30 DIAGNOSIS — I4891 Unspecified atrial fibrillation: Secondary | ICD-10-CM

## 2013-09-30 LAB — POCT INR: INR: 3.5

## 2013-10-02 ENCOUNTER — Other Ambulatory Visit: Payer: Self-pay | Admitting: Physician Assistant

## 2013-10-02 NOTE — Telephone Encounter (Signed)
Rx request to pharmacy/SLS  

## 2013-10-07 ENCOUNTER — Encounter: Payer: Self-pay | Admitting: Family

## 2013-10-07 ENCOUNTER — Ambulatory Visit (INDEPENDENT_AMBULATORY_CARE_PROVIDER_SITE_OTHER): Payer: Medicare Other | Admitting: Family

## 2013-10-07 ENCOUNTER — Telehealth: Payer: Self-pay | Admitting: Family

## 2013-10-07 VITALS — BP 130/74 | HR 44 | Temp 98.5°F | Ht 72.0 in | Wt 283.0 lb

## 2013-10-07 DIAGNOSIS — K612 Anorectal abscess: Secondary | ICD-10-CM | POA: Diagnosis not present

## 2013-10-07 DIAGNOSIS — K611 Rectal abscess: Secondary | ICD-10-CM

## 2013-10-07 MED ORDER — METRONIDAZOLE 500 MG PO TABS: 500.0000 mg | ORAL_TABLET | Freq: Four times a day (QID) | ORAL | Status: DC

## 2013-10-07 MED ORDER — CIPROFLOXACIN HCL 500 MG PO TABS: 500.0000 mg | ORAL_TABLET | Freq: Two times a day (BID) | ORAL | Status: DC

## 2013-10-07 NOTE — Patient Instructions (Addendum)
Start antibiotics. You will be contacted about your referral to the surgeon.  Soak in warm tub twice daily. Call if increased pain/swelling. Follow up in 1 week.

## 2013-10-07 NOTE — Progress Notes (Signed)
Pre visit review using our clinic review tool, if applicable. No additional management support is needed unless otherwise documented below in the visit note. 

## 2013-10-07 NOTE — Telephone Encounter (Signed)
I will call first thing in the morning, both offices are closed now.

## 2013-10-07 NOTE — Progress Notes (Signed)
Subjective:    Patient ID: Bruce Cole, male    DOB: May 09, 1941, 73 y.o.   MRN: [name]  HPI  Mr. Jagielski is a 73 yr old male who presents today with chief complaint of rectal pain. Reports that he had terrible diarrhea a few weeks back.  He reports previous hx of similar symptoms in 2002 for which he was given antibiotics.   Review of Systems See HPI  Past Medical History  Diagnosis Date  . Nephrolithiasis   . Arthritis     rheumatoid  . Colon polyp 2000  . Atrial fibrillation   . Unspecified essential hypertension   . Gout     History   Social History  . Marital Status: Single    Spouse Name: N/A    Number of Children: 3  . Years of Education: N/A   Occupational History  .     Social History Main Topics  . Smoking status: Never Smoker   . Smokeless tobacco: Never Used  . Alcohol Use: Yes     Comment: occasional  . Drug Use: No  . Sexual Activity: Not on file   Other Topics Concern  . 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 Spine surgery      x 2  . Joint replacement      Total L-Hip replacement, Right Knee 10/20/09  . Cholecystectomy  1994    Family History  Problem Relation Age of Onset  . Hypertension Mother   . Arthritis Mother     ?RA  . Hypertension Father     No Known Allergies  Current Outpatient Prescriptions on File Prior to Visit  Medication Sig Dispense Refill  . allopurinol (ZYLOPRIM) 100 MG tablet TAKE 2 TABLETS BY MOUTH EVERY DAY  60 tablet  5  . amLODipine (NORVASC) 10 MG tablet TAKE 1 TABLET BY MOUTH DAILY  30 tablet  2  . carvedilol (COREG) 12.5 MG tablet TAKE 1/2 TABLET BY MOUTH TWICE DAILY WITH A MEAL  30 tablet  1  . furosemide (LASIX) 40 MG tablet TAKE 2 TABLETS BY MOUTH TWICE DAILY  120 tablet  1  . hydrALAZINE (APRESOLINE) 50 MG tablet TAKE 1 TABLET BY MOUTH THREE TIMES DAILY  90 tablet  3  . isosorbide mononitrate  (IMDUR) 30 MG 24 hr tablet Take 1 tablet (30 mg total) by mouth daily.  30 tablet  6  . metolazone (ZAROXOLYN) 2.5 MG tablet Take one daily 30 min prior to your morning fluid pill dose 2 days a week.  10 tablet  11  . Multiple Vitamins-Minerals (PX MENS MULTIVITAMINS) TABS Take 1 tablet by mouth daily.      . ondansetron (ZOFRAN) 4 MG tablet Take 1 tablet (4 mg total) by mouth every 6 (six) hours as needed for nausea.  20 tablet  0  . potassium chloride SA (K-DUR,KLOR-CON) 20 MEQ tablet Take 1 tablet (20 mEq total) by mouth 3 (three) times daily.  90 tablet  11  . sildenafil (VIAGRA) 100 MG tablet Take 100 mg by mouth daily as needed for erectile dysfunction.       [name] spironolactone (ALDACTONE) 25 MG tablet Take 1 tablet (25 mg total) by mouth daily.  30 tablet  6  . terazosin (HYTRIN) 1 MG capsule TAKE 1 CAPSULE BY MOUTH EVERY NIGHT AT BEDTIME  30 capsule  2  .  traMADol (ULTRAM) 50 MG tablet Take 50 mg by mouth Twice daily.      [name] warfarin (COUMADIN) 7.5 MG tablet Take as directed by coumadin clinic  40 tablet  3  . warfarin (COUMADIN) 7.5 MG tablet TAKE AS DIRECTED BY COUMADIN CLINIC  40 tablet  3   No current facility-administered medications on file prior to visit.    BP 130/74  Pulse 44  Temp(Src) 98.5 F (36.9 C) (Oral)  Ht 6' (1.829 m)  Wt 283 lb (128.368 kg)  BMI 38.37 kg/m2  SpO2 99%       Objective:   Physical Exam  Constitutional: He is oriented to person, place, and time. He appears well-developed and well-nourished. No distress.  Cardiovascular: Normal rate and regular rhythm.   No murmur heard. Pulmonary/Chest: Effort normal and breath sounds normal. No respiratory distress. He has no wheezes. He has no rales. He exhibits no tenderness.  Genitourinary:  Firm area of induration noted posterior anus.  No fluctuance, + tenderness  Neurological: He is alert and oriented to person, place, and time.          Assessment & Plan:

## 2013-10-07 NOTE — Telephone Encounter (Signed)
Could you please contact the coumadin clinic and arrange a follow up appointment for this Friday. Notify them that he is on antibiotics. Also, Please see if surgery can see him tomorrow.  Thanks!

## 2013-10-08 ENCOUNTER — Encounter (INDEPENDENT_AMBULATORY_CARE_PROVIDER_SITE_OTHER): Payer: Self-pay | Admitting: General Surgery

## 2013-10-08 ENCOUNTER — Ambulatory Visit (INDEPENDENT_AMBULATORY_CARE_PROVIDER_SITE_OTHER): Payer: Medicare Other | Admitting: General Surgery

## 2013-10-08 VITALS — BP 118/78 | HR 64 | Temp 97.6°F | Resp 14 | Ht 75.0 in | Wt 281.6 lb

## 2013-10-08 DIAGNOSIS — K612 Anorectal abscess: Secondary | ICD-10-CM

## 2013-10-08 DIAGNOSIS — K611 Rectal abscess: Secondary | ICD-10-CM

## 2013-10-08 NOTE — Progress Notes (Signed)
Subjective:     Patient ID: Bruce Cole, male   DOB: 04/17/1941, 73 y.o.   MRN: [name]  HPI 29 yom who has prior history of perirectal abscess who presents today after several day history of rectal pain. He has been treating this at home and last night this resolved on its own and drained.  He is having bms.  No pain anymore.  He is satisfied that this has resolved.     Review of Systems  Constitutional: Negative for fever, chills and unexpected weight change.  HENT: Negative for congestion, hearing loss, sore throat, trouble swallowing and voice change.   Eyes: Negative for visual disturbance.  Respiratory: Negative for cough and wheezing.   Cardiovascular: Negative for chest pain, palpitations and leg swelling.  Gastrointestinal: Negative for nausea, vomiting, abdominal pain, diarrhea, constipation, blood in stool, abdominal distention, anal bleeding and rectal pain.  Genitourinary: Negative for hematuria and difficulty urinating.  Musculoskeletal: Negative for arthralgias.  Skin: Negative for rash and wound.  Neurological: Negative for seizures, syncope, weakness and headaches.  Hematological: Negative for adenopathy. Does not bruise/bleed easily.  Psychiatric/Behavioral: Negative for confusion.       Objective:   Physical Exam deferred    Assessment:     Resolved perirectal abscess     Plan:     I think this has resolved. He is happy with this. We discussed continued soaks and calling back if any more problems.

## 2013-10-09 DIAGNOSIS — N183 Chronic kidney disease, stage 3 unspecified: Secondary | ICD-10-CM | POA: Diagnosis not present

## 2013-10-11 ENCOUNTER — Ambulatory Visit (INDEPENDENT_AMBULATORY_CARE_PROVIDER_SITE_OTHER): Payer: Medicare Other | Admitting: *Deleted

## 2013-10-11 DIAGNOSIS — Z7901 Long term (current) use of anticoagulants: Secondary | ICD-10-CM | POA: Diagnosis not present

## 2013-10-11 DIAGNOSIS — I4891 Unspecified atrial fibrillation: Secondary | ICD-10-CM

## 2013-10-11 DIAGNOSIS — Z5181 Encounter for therapeutic drug level monitoring: Secondary | ICD-10-CM

## 2013-10-11 LAB — POCT INR: INR: 1.4

## 2013-10-13 DIAGNOSIS — K611 Rectal abscess: Secondary | ICD-10-CM | POA: Insufficient documentation

## 2013-10-13 NOTE — Assessment & Plan Note (Signed)
Recommended sitz baths bid, rx with cipro/flagyl, refer to surgery for possible I and D.

## 2013-10-15 DIAGNOSIS — M109 Gout, unspecified: Secondary | ICD-10-CM | POA: Diagnosis not present

## 2013-10-15 DIAGNOSIS — Z96659 Presence of unspecified artificial knee joint: Secondary | ICD-10-CM | POA: Diagnosis not present

## 2013-10-15 DIAGNOSIS — Z96649 Presence of unspecified artificial hip joint: Secondary | ICD-10-CM | POA: Diagnosis not present

## 2013-10-15 DIAGNOSIS — M069 Rheumatoid arthritis, unspecified: Secondary | ICD-10-CM | POA: Diagnosis not present

## 2013-10-17 ENCOUNTER — Ambulatory Visit (INDEPENDENT_AMBULATORY_CARE_PROVIDER_SITE_OTHER): Payer: Medicare Other | Admitting: *Deleted

## 2013-10-17 DIAGNOSIS — I4891 Unspecified atrial fibrillation: Secondary | ICD-10-CM | POA: Diagnosis not present

## 2013-10-17 DIAGNOSIS — Z7901 Long term (current) use of anticoagulants: Secondary | ICD-10-CM

## 2013-10-17 DIAGNOSIS — Z5181 Encounter for therapeutic drug level monitoring: Secondary | ICD-10-CM

## 2013-10-17 LAB — POCT INR: INR: 3.3

## 2013-10-21 ENCOUNTER — Other Ambulatory Visit: Payer: Self-pay | Admitting: Family

## 2013-10-29 ENCOUNTER — Other Ambulatory Visit (HOSPITAL_COMMUNITY): Payer: Self-pay | Admitting: Internal Medicine

## 2013-10-29 ENCOUNTER — Other Ambulatory Visit (HOSPITAL_COMMUNITY): Payer: Self-pay | Admitting: Cardiology

## 2013-10-31 ENCOUNTER — Ambulatory Visit (INDEPENDENT_AMBULATORY_CARE_PROVIDER_SITE_OTHER): Payer: Medicare Other | Admitting: Pharmacist Clinician (PhC)/ Clinical Pharmacy Specialist

## 2013-10-31 DIAGNOSIS — I4891 Unspecified atrial fibrillation: Secondary | ICD-10-CM | POA: Diagnosis not present

## 2013-10-31 DIAGNOSIS — Z5181 Encounter for therapeutic drug level monitoring: Secondary | ICD-10-CM | POA: Diagnosis not present

## 2013-10-31 DIAGNOSIS — Z7901 Long term (current) use of anticoagulants: Secondary | ICD-10-CM

## 2013-10-31 DIAGNOSIS — Z09 Encounter for follow-up examination after completed treatment for conditions other than malignant neoplasm: Secondary | ICD-10-CM | POA: Diagnosis not present

## 2013-10-31 DIAGNOSIS — Z0389 Encounter for observation for other suspected diseases and conditions ruled out: Secondary | ICD-10-CM | POA: Diagnosis not present

## 2013-10-31 LAB — POCT INR: INR: 3.3

## 2013-11-12 ENCOUNTER — Other Ambulatory Visit: Payer: Self-pay | Admitting: Family

## 2013-11-14 ENCOUNTER — Ambulatory Visit (INDEPENDENT_AMBULATORY_CARE_PROVIDER_SITE_OTHER): Payer: Medicare Other | Admitting: Pharmacist Clinician (PhC)/ Clinical Pharmacy Specialist

## 2013-11-14 DIAGNOSIS — Z5181 Encounter for therapeutic drug level monitoring: Secondary | ICD-10-CM

## 2013-11-14 DIAGNOSIS — I4891 Unspecified atrial fibrillation: Secondary | ICD-10-CM

## 2013-11-14 DIAGNOSIS — Z7901 Long term (current) use of anticoagulants: Secondary | ICD-10-CM

## 2013-11-14 LAB — POCT INR: INR: 1.9

## 2013-11-18 ENCOUNTER — Telehealth: Payer: Self-pay | Admitting: *Deleted

## 2013-11-18 MED ORDER — VALACYCLOVIR HCL 500 MG PO TABS: 500.0000 mg | ORAL_TABLET | Freq: Every day | ORAL | Status: DC

## 2013-11-18 NOTE — Telephone Encounter (Signed)
Pt left message requesting refill of valcyclovir 500mg .  Please advise re: directions / quantity?

## 2013-11-18 NOTE — Telephone Encounter (Signed)
rx sent

## 2013-12-03 ENCOUNTER — Ambulatory Visit: Payer: Medicare Other | Admitting: Family

## 2013-12-04 ENCOUNTER — Ambulatory Visit (INDEPENDENT_AMBULATORY_CARE_PROVIDER_SITE_OTHER): Payer: Medicare Other | Admitting: Family

## 2013-12-04 ENCOUNTER — Encounter: Payer: Self-pay | Admitting: Family

## 2013-12-04 VITALS — BP 110/60 | HR 43 | Temp 98.1°F | Resp 16 | Ht 72.0 in | Wt 284.6 lb

## 2013-12-04 DIAGNOSIS — E119 Type 2 diabetes mellitus without complications: Secondary | ICD-10-CM | POA: Diagnosis not present

## 2013-12-04 DIAGNOSIS — Z0000 Encounter for general adult medical examination without abnormal findings: Secondary | ICD-10-CM | POA: Diagnosis not present

## 2013-12-04 DIAGNOSIS — I10 Essential (primary) hypertension: Secondary | ICD-10-CM | POA: Diagnosis not present

## 2013-12-04 DIAGNOSIS — R7309 Other abnormal glucose: Secondary | ICD-10-CM

## 2013-12-04 DIAGNOSIS — Z23 Encounter for immunization: Secondary | ICD-10-CM

## 2013-12-04 DIAGNOSIS — I4891 Unspecified atrial fibrillation: Secondary | ICD-10-CM

## 2013-12-04 LAB — HEMOGLOBIN A1C
Hgb A1c MFr Bld: 6.4 % — ABNORMAL HIGH (ref <5.7)
Mean Plasma Glucose: 137 mg/dL — ABNORMAL HIGH (ref <117)

## 2013-12-04 NOTE — Assessment & Plan Note (Signed)
Rate Stable.  Management per coumadin clinic.

## 2013-12-04 NOTE — Patient Instructions (Signed)
Please complete your lab work prior to leaving. Follow up in 3-4 months.

## 2013-12-04 NOTE — Assessment & Plan Note (Signed)
Stable on current meds.  Continue same. 

## 2013-12-04 NOTE — Addendum Note (Signed)
Addended by: Kelle Darting A on: 12/04/2013 08:50 AM   Modules accepted: Orders

## 2013-12-04 NOTE — Progress Notes (Signed)
Subjective:    Patient ID: Bruce Cole, male    DOB: 1941/01/01, 73 y.o.   MRN: [name]  HPI  Bruce Cole is a 73 yr old male who presents today for follow up.  1) HTN-  maintianed on amlodipine, furosemide, aldactone, hytrin. BP Readings from Last 3 Encounters:  12/04/13 110/60  10/08/13 118/78  10/07/13 130/74    2)AF- on coumadin- stable- management per coumadin clinic.  3) Renal insufficiency-   Follows with nephrology. Had BMET in march- Cr was 1.6.   Review of Systems See HPI  Past Medical History  Diagnosis Date  . Nephrolithiasis   . Arthritis     rheumatoid  . Colon polyp 2000  . Atrial fibrillation   . Unspecified essential hypertension   . Gout     History   Social History  . Marital Status: Single    Spouse Name: N/A    Number of Children: 3  . Years of Education: N/A   Occupational History  .     Social History Main Topics  . Smoking status: Never Smoker   . Smokeless tobacco: Never Used  . Alcohol Use: Yes     Comment: occasional  . Drug Use: No  . Sexual Activity: Not on file   Other Topics Concern  . 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 Spine surgery      x 2  . Joint replacement      Total L-Hip replacement, Right Knee 10/20/09  . Cholecystectomy  1994    Family History  Problem Relation Age of Onset  . Hypertension Mother   . Arthritis Mother     ?RA  . Hypertension Father     No Known Allergies  Current Outpatient Prescriptions on File Prior to Visit  Medication Sig Dispense Refill  . allopurinol (ZYLOPRIM) 100 MG tablet TAKE 2 TABLETS BY MOUTH EVERY DAY  60 tablet  5  . amLODipine (NORVASC) 10 MG tablet TAKE 1 TABLET BY MOUTH DAILY  30 tablet  2  . furosemide (LASIX) 40 MG tablet TAKE 2 TABLETS BY MOUTH TWICE DAILY  120 tablet  2  . hydrALAZINE (APRESOLINE) 50 MG tablet TAKE 1 TABLET BY MOUTH THREE TIMES  DAILY  90 tablet  3  . isosorbide mononitrate (IMDUR) 30 MG 24 hr tablet TAKE 1 TABLET BY MOUTH DAILY  30 tablet  6  . metolazone (ZAROXOLYN) 2.5 MG tablet Take one daily 30 min prior to your morning fluid pill dose 2 days a week.  10 tablet  11  . metroNIDAZOLE (FLAGYL) 500 MG tablet Take 1 tablet (500 mg total) by mouth 4 (four) times daily.  40 tablet  0  . Multiple Vitamins-Minerals (PX MENS MULTIVITAMINS) TABS Take 1 tablet by mouth daily.      . ondansetron (ZOFRAN) 4 MG tablet Take 1 tablet (4 mg total) by mouth every 6 (six) hours as needed for nausea.  20 tablet  0  . potassium chloride SA (K-DUR,KLOR-CON) 20 MEQ tablet Take 1 tablet (20 mEq total) by mouth 3 (three) times daily.  90 tablet  11  . sildenafil (VIAGRA) 100 MG tablet Take 100 mg by mouth daily as needed for erectile dysfunction.       [name] spironolactone (ALDACTONE) 25 MG tablet TAKE 1 TABLET BY MOUTH EVERY DAY  30 tablet  6  .  terazosin (HYTRIN) 1 MG capsule TAKE 1 CAPSULE BY MOUTH EVERY NIGHT AT BEDTIME  30 capsule  2  . traMADol (ULTRAM) 50 MG tablet Take 50 mg by mouth Twice daily.      . valACYclovir (VALTREX) 500 MG tablet Take 1 tablet (500 mg total) by mouth daily.  30 tablet  5  . warfarin (COUMADIN) 7.5 MG tablet TAKE AS DIRECTED BY COUMADIN CLINIC  40 tablet  3   No current facility-administered medications on file prior to visit.    BP 110/60  Pulse 43  Temp(Src) 98.1 F (36.7 C) (Oral)  Resp 16  Ht 6' (1.829 m)  Wt 284 lb 9.6 oz (129.094 kg)  BMI 38.59 kg/m2  SpO2 99%       Objective:   Physical Exam  Constitutional: He is oriented to person, place, and time. He appears well-developed and well-nourished. No distress.  Cardiovascular: Normal rate and regular rhythm.   No murmur heard. Pulmonary/Chest: Effort normal and breath sounds normal. No respiratory distress. He has no wheezes. He has no rales. He exhibits no tenderness.  Neurological: He is alert and oriented to person, place, and time.    Psychiatric: He has a normal mood and affect. His behavior is normal. Judgment and thought content normal.          Assessment & Plan:

## 2013-12-04 NOTE — Progress Notes (Signed)
Pre visit review using our clinic review tool, if applicable. No additional management support is needed unless otherwise documented below in the visit note. 

## 2013-12-04 NOTE — Assessment & Plan Note (Signed)
Obtain follow up A1C.   

## 2013-12-12 ENCOUNTER — Ambulatory Visit (INDEPENDENT_AMBULATORY_CARE_PROVIDER_SITE_OTHER): Payer: Medicare Other | Admitting: *Deleted

## 2013-12-12 DIAGNOSIS — Z5181 Encounter for therapeutic drug level monitoring: Secondary | ICD-10-CM | POA: Diagnosis not present

## 2013-12-12 DIAGNOSIS — Z7901 Long term (current) use of anticoagulants: Secondary | ICD-10-CM | POA: Diagnosis not present

## 2013-12-12 DIAGNOSIS — I4891 Unspecified atrial fibrillation: Secondary | ICD-10-CM | POA: Diagnosis not present

## 2013-12-12 LAB — POCT INR: INR: 3.4

## 2013-12-18 DIAGNOSIS — Z79899 Other long term (current) drug therapy: Secondary | ICD-10-CM | POA: Diagnosis not present

## 2013-12-18 DIAGNOSIS — M064 Inflammatory polyarthropathy: Secondary | ICD-10-CM | POA: Diagnosis not present

## 2013-12-19 ENCOUNTER — Other Ambulatory Visit: Payer: Self-pay | Admitting: Family

## 2013-12-19 NOTE — Telephone Encounter (Signed)
Rx request to pharmacy/SLS  

## 2013-12-26 ENCOUNTER — Ambulatory Visit (INDEPENDENT_AMBULATORY_CARE_PROVIDER_SITE_OTHER): Payer: Medicare Other | Admitting: *Deleted

## 2013-12-26 DIAGNOSIS — Z7901 Long term (current) use of anticoagulants: Secondary | ICD-10-CM

## 2013-12-26 DIAGNOSIS — Z5181 Encounter for therapeutic drug level monitoring: Secondary | ICD-10-CM | POA: Diagnosis not present

## 2013-12-26 DIAGNOSIS — I4891 Unspecified atrial fibrillation: Secondary | ICD-10-CM

## 2013-12-26 LAB — POCT INR: INR: 3.4

## 2014-01-09 ENCOUNTER — Ambulatory Visit (INDEPENDENT_AMBULATORY_CARE_PROVIDER_SITE_OTHER): Payer: Medicare Other | Admitting: *Deleted

## 2014-01-09 DIAGNOSIS — I4891 Unspecified atrial fibrillation: Secondary | ICD-10-CM | POA: Diagnosis not present

## 2014-01-09 DIAGNOSIS — Z7901 Long term (current) use of anticoagulants: Secondary | ICD-10-CM

## 2014-01-09 DIAGNOSIS — Z5181 Encounter for therapeutic drug level monitoring: Secondary | ICD-10-CM

## 2014-01-09 DIAGNOSIS — M2550 Pain in unspecified joint: Secondary | ICD-10-CM | POA: Diagnosis not present

## 2014-01-09 DIAGNOSIS — Z79899 Other long term (current) drug therapy: Secondary | ICD-10-CM | POA: Diagnosis not present

## 2014-01-09 LAB — POCT INR: INR: 2.9

## 2014-01-23 ENCOUNTER — Ambulatory Visit (INDEPENDENT_AMBULATORY_CARE_PROVIDER_SITE_OTHER): Payer: Medicare Other | Admitting: *Deleted

## 2014-01-23 DIAGNOSIS — Z7901 Long term (current) use of anticoagulants: Secondary | ICD-10-CM

## 2014-01-23 DIAGNOSIS — Z5181 Encounter for therapeutic drug level monitoring: Secondary | ICD-10-CM | POA: Diagnosis not present

## 2014-01-23 DIAGNOSIS — I4891 Unspecified atrial fibrillation: Secondary | ICD-10-CM | POA: Diagnosis not present

## 2014-01-23 LAB — POCT INR: INR: 3.2

## 2014-02-06 ENCOUNTER — Other Ambulatory Visit: Payer: Self-pay | Admitting: Family

## 2014-02-06 MED ORDER — CARVEDILOL 12.5 MG PO TABS: ORAL_TABLET | ORAL | Status: DC

## 2014-02-06 NOTE — Telephone Encounter (Signed)
Please contact pt and verify the dose of his carvedilol.  I have the following:  12.5 one tab in AM and 1/2 tab in PM.  We received refill request for the 25 mg tabs but I believe that is an old dosing for him.  Thanks

## 2014-02-06 NOTE — Telephone Encounter (Signed)
Pt was contacted dosage is correct 12.5 on his med list at this time.Contacted Pharm they also have correct dosage.Pharm stated Pt may have called in a refill from an older bottle.(Pt also states with 25 mg. He was splitting the pill.

## 2014-02-07 ENCOUNTER — Ambulatory Visit (INDEPENDENT_AMBULATORY_CARE_PROVIDER_SITE_OTHER): Payer: Medicare Other | Admitting: *Deleted

## 2014-02-07 DIAGNOSIS — I4891 Unspecified atrial fibrillation: Secondary | ICD-10-CM | POA: Diagnosis not present

## 2014-02-07 DIAGNOSIS — Z5181 Encounter for therapeutic drug level monitoring: Secondary | ICD-10-CM

## 2014-02-07 DIAGNOSIS — Z7901 Long term (current) use of anticoagulants: Secondary | ICD-10-CM

## 2014-02-07 LAB — POCT INR: INR: 1.9

## 2014-02-11 ENCOUNTER — Encounter (HOSPITAL_COMMUNITY): Payer: Self-pay | Admitting: Vascular Surgery

## 2014-02-13 ENCOUNTER — Other Ambulatory Visit: Payer: Self-pay | Admitting: Family

## 2014-02-13 NOTE — Telephone Encounter (Signed)
Rx request to pharmacy/SLS  

## 2014-02-17 ENCOUNTER — Telehealth (HOSPITAL_COMMUNITY): Payer: Self-pay | Admitting: Vascular Surgery

## 2014-02-17 MED ORDER — METOLAZONE 2.5 MG PO TABS: ORAL_TABLET | ORAL | Status: DC

## 2014-02-17 NOTE — Telephone Encounter (Signed)
Pt is out of Metolazone he has appt on Aug 20

## 2014-02-17 NOTE — Telephone Encounter (Signed)
Requested meds sent into pharmacy

## 2014-02-28 ENCOUNTER — Other Ambulatory Visit: Payer: Self-pay | Admitting: Family

## 2014-02-28 ENCOUNTER — Other Ambulatory Visit: Payer: Self-pay | Admitting: Internal Medicine

## 2014-02-28 ENCOUNTER — Ambulatory Visit (INDEPENDENT_AMBULATORY_CARE_PROVIDER_SITE_OTHER): Payer: Medicare Other | Admitting: *Deleted

## 2014-02-28 DIAGNOSIS — Z5181 Encounter for therapeutic drug level monitoring: Secondary | ICD-10-CM

## 2014-02-28 DIAGNOSIS — Z7901 Long term (current) use of anticoagulants: Secondary | ICD-10-CM | POA: Diagnosis not present

## 2014-02-28 DIAGNOSIS — I4891 Unspecified atrial fibrillation: Secondary | ICD-10-CM | POA: Diagnosis not present

## 2014-02-28 LAB — POCT INR: INR: 3.6

## 2014-03-06 ENCOUNTER — Ambulatory Visit (HOSPITAL_COMMUNITY)
Admission: RE | Admit: 2014-03-06 | Discharge: 2014-03-06 | Disposition: A | Discharge status: Home / Self Care | Payer: Medicare Other | Source: Ambulatory Visit | Attending: Internal Medicine | Admitting: Internal Medicine

## 2014-03-06 VITALS — BP 128/70 | HR 50 | Wt 288.5 lb

## 2014-03-06 DIAGNOSIS — I4891 Unspecified atrial fibrillation: Secondary | ICD-10-CM | POA: Insufficient documentation

## 2014-03-06 DIAGNOSIS — M069 Rheumatoid arthritis, unspecified: Secondary | ICD-10-CM | POA: Insufficient documentation

## 2014-03-06 DIAGNOSIS — I482 Chronic atrial fibrillation, unspecified: Secondary | ICD-10-CM

## 2014-03-06 DIAGNOSIS — I509 Heart failure, unspecified: Secondary | ICD-10-CM | POA: Insufficient documentation

## 2014-03-06 DIAGNOSIS — Z7901 Long term (current) use of anticoagulants: Secondary | ICD-10-CM | POA: Insufficient documentation

## 2014-03-06 DIAGNOSIS — I5032 Chronic diastolic (congestive) heart failure: Secondary | ICD-10-CM | POA: Diagnosis not present

## 2014-03-06 DIAGNOSIS — M109 Gout, unspecified: Secondary | ICD-10-CM | POA: Insufficient documentation

## 2014-03-06 DIAGNOSIS — I10 Essential (primary) hypertension: Secondary | ICD-10-CM | POA: Diagnosis not present

## 2014-03-06 DIAGNOSIS — G4733 Obstructive sleep apnea (adult) (pediatric): Secondary | ICD-10-CM | POA: Diagnosis not present

## 2014-03-06 LAB — CBC WITH DIFFERENTIAL/PLATELET
BASOS PCT: 1 % (ref 0–1)
Basophils Absolute: 0 10*3/uL (ref 0.0–0.1)
Eosinophils Absolute: 0.2 10*3/uL (ref 0.0–0.7)
Eosinophils Relative: 2 % (ref 0–5)
HCT: 38.2 % — ABNORMAL LOW (ref 39.0–52.0)
HEMOGLOBIN: 13.4 g/dL (ref 13.0–17.0)
Lymphocytes Relative: 26 % (ref 12–46)
Lymphs Abs: 2.2 10*3/uL (ref 0.7–4.0)
MCH: 33.2 pg (ref 26.0–34.0)
MCHC: 35.1 g/dL (ref 30.0–36.0)
MCV: 94.6 fL (ref 78.0–100.0)
Monocytes Absolute: 0.6 10*3/uL (ref 0.1–1.0)
Monocytes Relative: 7 % (ref 3–12)
NEUTROS ABS: 5.2 10*3/uL (ref 1.7–7.7)
NEUTROS PCT: 64 % (ref 43–77)
Platelets: 160 10*3/uL (ref 150–400)
RBC: 4.04 MIL/uL — ABNORMAL LOW (ref 4.22–5.81)
RDW: 14.6 % (ref 11.5–15.5)
WBC: 8.2 10*3/uL (ref 4.0–10.5)

## 2014-03-06 LAB — BASIC METABOLIC PANEL
ANION GAP: 12 (ref 5–15)
BUN: 37 mg/dL — ABNORMAL HIGH (ref 6–23)
CHLORIDE: 101 meq/L (ref 96–112)
CO2: 27 mEq/L (ref 19–32)
Calcium: 9.9 mg/dL (ref 8.4–10.5)
Creatinine, Ser: 1.52 mg/dL — ABNORMAL HIGH (ref 0.50–1.35)
GFR calc non Af Amer: 45 mL/min — ABNORMAL LOW (ref >90)
GFR, EST AFRICAN AMERICAN: 52 mL/min — AB (ref >90)
Glucose, Bld: 117 mg/dL — ABNORMAL HIGH (ref 70–99)
POTASSIUM: 4 meq/L (ref 3.7–5.3)
Sodium: 140 mEq/L (ref 137–147)

## 2014-03-06 LAB — PRO B NATRIURETIC PEPTIDE: PRO B NATRI PEPTIDE: 356.7 pg/mL — AB (ref 0–125)

## 2014-03-06 MED ORDER — CARVEDILOL 12.5 MG PO TABS: ORAL_TABLET | ORAL | Status: DC

## 2014-03-06 NOTE — Progress Notes (Signed)
Patient ID: Bruce Cole, male   DOB: 1941-03-02, 73 y.o.   MRN: [id number]   Weight Range   Baseline proBNP    HPI: Bruce Cole is a 73 yo male with a history of chronic atrial fibrillation, HTN and arthritis. He has been on chronic coumadin therapy. Primarily diastolic CHF with prominent RV failure.   R/LHC 02/2013 RA = 28 with prominent v-waves  RV = 67/21/28  PA = 70/23 (42)  PCW = 29  Fick cardiac output/index = 7.5/2.8  PVR = 1.8 Woods  SVR = 610  FA sat = 96%  PA sat = 64%, 70%  No RV LV interaction  Near equalization of RV, LV and RA diastolic pressures ** Essentially normal coronary arteries**  Echo 03/2013 EF 45-50% with grade II diastolic dysfunction, moderate to severe RV dilation with mild systolic dysfunction, PA systolic pressure 60 mmHg.   Since last appointment, he has been stable.  HR in the 40s-50s but this is chronic for him x years.  No lightheadedness or syncope.  Weight is up about 10 lbs.  He says he has been getting less exercise this summer.  He denies exertional dyspnea or chest pain.  No orthopnea or PND.  He has OSA but is unable to tolerate CPAP.   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  Past Medical History  Diagnosis Date  . Nephrolithiasis   . Arthritis     rheumatoid  . Colon polyp 2000  . Atrial fibrillation   . Unspecified essential hypertension   . Gout     Current Outpatient Prescriptions  Medication Sig Dispense Refill  . allopurinol (ZYLOPRIM) 100 MG tablet TAKE 2 TABLETS BY MOUTH EVERY DAY  60 tablet  5  . amLODipine (NORVASC) 10 MG tablet TAKE 1 TABLET BY MOUTH  DAILY  30 tablet  0  . carvedilol (COREG) 12.5 MG tablet 1 tab PO in the AM and 1/2 tab PO in the evening  45 tablet  5  . furosemide (LASIX) 40 MG tablet TAKE 2 TABLETS BY MOUTH TWICE DAILY  120 tablet  0  . hydrALAZINE (APRESOLINE) 50 MG tablet TAKE 1 TABLET BY MOUTH THREE TIMES DAILY  90 tablet  1  . isosorbide mononitrate (IMDUR) 30 MG 24 hr tablet TAKE 1 TABLET BY MOUTH DAILY  30 tablet  6  . metolazone (ZAROXOLYN) 2.5 MG tablet Take one daily 30 min prior to your morning fluid pill dose 2 days a week.  10 tablet  11  . Multiple Vitamins-Minerals (PX MENS MULTIVITAMINS) TABS Take 1 tablet by mouth daily.      . ondansetron (ZOFRAN) 4 MG tablet Take 1 tablet (4 mg total) by mouth every 6 (six) hours as needed for nausea.  20 tablet  0  . potassium chloride SA (K-DUR,KLOR-CON) 20 MEQ tablet Take 1 tablet (20 mEq total) by mouth 3 (three) times daily.  90 tablet  11  . sildenafil (VIAGRA) 100 MG tablet Take 100 mg  by mouth daily as needed for erectile dysfunction.       [name] spironolactone (ALDACTONE) 25 MG tablet TAKE 1 TABLET BY MOUTH EVERY DAY  30 tablet  6  . terazosin (HYTRIN) 1 MG capsule TAKE 1 CAPSULE BY MOUTH EVERY NIGHT AT BEDTIME  30 capsule  2  . traMADol (ULTRAM) 50 MG tablet Take 50 mg by mouth Twice daily.      . valACYclovir (VALTREX) 500 MG tablet Take 1 tablet (500 mg total) by mouth daily.  30 tablet  5  . warfarin (COUMADIN) 7.5 MG tablet TAKE AS DIRECTED BY COUMADIN CLINIC  40 tablet  0   No current facility-administered medications for this encounter.   PHYSICAL EXAM: Filed Vitals:   03/06/14 0859  BP: 128/70  Pulse: 50  Weight: 288 lb 8 oz (130.863 kg)  SpO2: 99%   General:  Well appearing. No resp difficulty. Ambulated into clinic with a cane.  HEENT: normal Neck: supple. JVP 8 cm. Carotids 2+ bilaterally; no bruits. No lymphadenopathy or thryomegaly appreciated. Cor: PMI normal. Bradycardic, regular rate & rhythm. No rubs, gallops or murmurs. Lungs:  clear Abdomen: soft, nontender, nondistended. No hepatosplenomegaly. No bruits or masses. Good bowel sounds. Extremities: no cyanosis, clubbing, rash,  no edema Neuro: alert & orientedx3, cranial nerves grossly intact. Moves all 4 extremities w/o difficulty. Affect pleasant.  ASSESSMENT & PLAN:  1) Chronic systolic HF with RV dilation/dysfunction: Echo 03/2013 EF 45-50% with grade II diastolic dysfunction, moderate to severe RV dilation with mild systolic dysfunction, PA systolic pressure 60 mmHg. Prominent RV involvement.  RHC/LHC in 8/14 with no CAD, suggestive of restrictive physiology, and pulmonary venous hypertension (low PVR). NYHA II symptoms. Weight is up but he has been relatively inactive recently.  He is not significantly volume overloaded on exam.  - Continue lasix 80 mg bid and metolazone twice a week. Instructed to take an additional 40 mg of lasix if weight is 275 pounds or greater.  - Continue Coreg 12.5 mg qam, 6.25 mg qpm.  - Continue hydralazine 50 mg tid and Imdur 30 mg daily.  He understands he should not use Viagra while on Imdur . - Check BMET/BNP today.   2) OSA: Unable to tolerate CPAP (has tried multiple masks).  3) Atrial fibrillation: Chronic, rate controlled. Continue coumadin. Not interested in NOAC. Check CBC today.  4) I asked him to increase his exercise.  He needs to work on weight loss.   [name] 03/06/2014

## 2014-03-06 NOTE — Patient Instructions (Signed)
Labs today.  Your physician recommends that you schedule a follow-up appointment in: 3 months  Do the following things EVERYDAY: 1) Weigh yourself in the morning before breakfast. Write it down and keep it in a log. 2) Take your medicines as prescribed 3) Eat low salt foods-Limit salt (sodium) to 2000 mg per day.  4) Stay as active as you can everyday 5) Limit all fluids for the day to less than 2 liters 6)   

## 2014-03-07 ENCOUNTER — Other Ambulatory Visit: Payer: Self-pay | Admitting: Internal Medicine

## 2014-03-07 NOTE — Addendum Note (Signed)
Encounter addended by: Bonne Dolores, NT on: 03/07/2014 11:06 AM<BR>     Documentation filed: Charges VN

## 2014-03-10 ENCOUNTER — Telehealth (HOSPITAL_COMMUNITY): Payer: Self-pay | Admitting: Cardiology

## 2014-03-10 NOTE — Telephone Encounter (Signed)
Pt aware.

## 2014-03-10 NOTE — Telephone Encounter (Signed)
Message copied by Monifah Freehling, Sharlot Gowda on Mon Mar 10, 2014  4:20 PM ------      Message from: Larey Dresser      Created: Fri Mar 07, 2014  4:57 PM       Creatinine better, BNP stable. May report to patient. ------

## 2014-03-11 ENCOUNTER — Ambulatory Visit: Payer: Medicare Other | Admitting: Family

## 2014-03-16 ENCOUNTER — Other Ambulatory Visit: Payer: Self-pay | Admitting: Family

## 2014-03-17 ENCOUNTER — Telehealth: Payer: Self-pay | Admitting: *Deleted

## 2014-03-17 ENCOUNTER — Encounter: Payer: Self-pay | Admitting: Family

## 2014-03-17 ENCOUNTER — Ambulatory Visit (INDEPENDENT_AMBULATORY_CARE_PROVIDER_SITE_OTHER): Payer: Medicare Other | Admitting: Family

## 2014-03-17 VITALS — BP 118/60 | HR 46 | Temp 98.3°F | Resp 16 | Ht 72.0 in | Wt 289.8 lb

## 2014-03-17 DIAGNOSIS — M069 Rheumatoid arthritis, unspecified: Secondary | ICD-10-CM

## 2014-03-17 DIAGNOSIS — R7309 Other abnormal glucose: Secondary | ICD-10-CM

## 2014-03-17 DIAGNOSIS — E669 Obesity, unspecified: Secondary | ICD-10-CM | POA: Diagnosis not present

## 2014-03-17 DIAGNOSIS — I10 Essential (primary) hypertension: Secondary | ICD-10-CM

## 2014-03-17 DIAGNOSIS — Z136 Encounter for screening for cardiovascular disorders: Secondary | ICD-10-CM | POA: Diagnosis not present

## 2014-03-17 DIAGNOSIS — R739 Hyperglycemia, unspecified: Secondary | ICD-10-CM

## 2014-03-17 DIAGNOSIS — Z23 Encounter for immunization: Secondary | ICD-10-CM | POA: Diagnosis not present

## 2014-03-17 LAB — LIPID PANEL
Cholesterol: 167 mg/dL (ref 0–200)
HDL: 70.3 mg/dL (ref >39.00)
LDL Cholesterol: 83 mg/dL (ref 0–99)
NonHDL: 96.7
Total CHOL/HDL Ratio: 2
Triglycerides: 71 mg/dL (ref 0.0–149.0)
VLDL: 14.2 mg/dL (ref 0.0–40.0)

## 2014-03-17 LAB — MICROALBUMIN / CREATININE URINE RATIO
Creatinine,U: 37.3 mg/dL
MICROALB UR: 9.6 mg/dL — AB (ref 0.0–1.9)
MICROALB/CREAT RATIO: 25.8 mg/g (ref 0.0–30.0)

## 2014-03-17 LAB — HEMOGLOBIN A1C: HEMOGLOBIN A1C: 6.7 % — AB (ref 4.6–6.5)

## 2014-03-17 NOTE — Assessment & Plan Note (Signed)
Stable. Obtain A1C.

## 2014-03-17 NOTE — Progress Notes (Signed)
Subjective:    Patient ID: Bruce Cole, male    DOB: 1940/12/15, 73 y.o.   MRN: [name]  HPI  Bruce Cole is a 73 yr old male who presents today for follow up.  1) HTN- Denies CP/SOB reports intermittent LE edema.   BP Readings from Last 3 Encounters:  03/17/14 118/60  03/06/14 128/70  12/04/13 110/60   2) Hyperglycemia-reports that he had eye exam 2 months ago and was told that his eyes are healthy.  Lab Results  Component Value Date   HGBA1C 6.4* 12/04/2013   3) RA- plays golf 3 times a week. Reports Chronic joint pain as a result but continues to exercise and "work through the pain."  He has upcoming appointment with Dr. Estanislado Pandy.    Review of Systems See HPI  Past Medical History  Diagnosis Date  . Nephrolithiasis   . Arthritis     rheumatoid  . Colon polyp 2000  . Atrial fibrillation   . Unspecified essential hypertension   . Gout     History   Social History  . Marital Status: Single    Spouse Name: N/A    Number of Children: 3  . Years of Education: N/A   Occupational History  .     Social History Main Topics  . Smoking status: Never Smoker   . Smokeless tobacco: Never Used  . Alcohol Use: Yes     Comment: occasional  . Drug Use: No  . Sexual Activity: Not on file   Other Topics Concern  . 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 Spine surgery      x 2  . Joint replacement      Total L-Hip replacement, Right Knee 10/20/09  . Cholecystectomy  1994    Family History  Problem Relation Age of Onset  . Hypertension Mother   . Arthritis Mother     ?RA  . Hypertension Father     No Known Allergies  Current Outpatient Prescriptions on File Prior to Visit  Medication Sig Dispense Refill  . allopurinol (ZYLOPRIM) 100 MG tablet TAKE 2 TABLETS BY MOUTH EVERY DAY  60 tablet  5  . amLODipine (NORVASC) 10 MG tablet TAKE 1 TABLET BY MOUTH  DAILY  30 tablet  0  . carvedilol (COREG) 12.5 MG tablet 1 tab PO in the AM and 1/2 tab PO in the evening  45 tablet  5  . furosemide (LASIX) 40 MG tablet TAKE 2 TABLETS BY MOUTH TWICE DAILY  120 tablet  0  . hydrALAZINE (APRESOLINE) 50 MG tablet TAKE 1 TABLET BY MOUTH THREE TIMES DAILY  90 tablet  1  . isosorbide mononitrate (IMDUR) 30 MG 24 hr tablet TAKE 1 TABLET BY MOUTH DAILY  30 tablet  6  . metolazone (ZAROXOLYN) 2.5 MG tablet Take one daily 30 min prior to your morning fluid pill dose 2 days a week.  10 tablet  11  . Multiple Vitamins-Minerals (PX MENS MULTIVITAMINS) TABS Take 1 tablet by mouth daily.      . ondansetron (ZOFRAN) 4 MG tablet Take 1 tablet (4 mg total) by mouth every 6 (six) hours as needed for nausea.  20 tablet  0  . potassium chloride SA (K-DUR,KLOR-CON) 20 MEQ tablet TAKE 1 TABLET BY MOUTH THREE TIMES DAILY  90 tablet  0  . sildenafil (VIAGRA) 100 MG  tablet Take 100 mg by mouth daily as needed for erectile dysfunction.       [name] spironolactone (ALDACTONE) 25 MG tablet TAKE 1 TABLET BY MOUTH EVERY DAY  30 tablet  6  . terazosin (HYTRIN) 1 MG capsule TAKE 1 CAPSULE BY MOUTH EVERY NIGHT AT BEDTIME  30 capsule  2  . traMADol (ULTRAM) 50 MG tablet Take 50 mg by mouth Twice daily.      . valACYclovir (VALTREX) 500 MG tablet Take 1 tablet (500 mg total) by mouth daily.  30 tablet  5  . warfarin (COUMADIN) 7.5 MG tablet TAKE AS DIRECTED BY COUMADIN CLINIC  40 tablet  0   No current facility-administered medications on file prior to visit.    BP 118/60  Pulse 46  Temp(Src) 98.3 F (36.8 C) (Oral)  Resp 16  Ht 6' (1.829 m)  Wt 289 lb 12.8 oz (131.452 kg)  BMI 39.30 kg/m2  SpO2 99%       Objective:   Physical Exam  Constitutional: He is oriented to person, place, and time. He appears well-developed and well-nourished. No distress.  HENT:  Head: Normocephalic and atraumatic.  Cardiovascular: Normal rate and regular rhythm.   No murmur heard. Pulmonary/Chest: Effort  normal and breath sounds normal. No respiratory distress. He has no wheezes. He has no rales. He exhibits no tenderness.  Neurological: He is alert and oriented to person, place, and time.  Psychiatric: He has a normal mood and affect. His behavior is normal. Judgment and thought content normal.          Assessment & Plan:

## 2014-03-17 NOTE — Patient Instructions (Signed)
Please complete lab work prior to leaving. Follow up in 3 months. Have a nice Fall.

## 2014-03-17 NOTE — Telephone Encounter (Signed)
Pt in office for follow up and states pharmacy told him they never received Carvedilol. Spoke with pharmacist and she states Rx was picked up on 03/08/14. Left detailed message on home # for pt to contact pharmacy re: Carvedilol Rx.

## 2014-03-17 NOTE — Assessment & Plan Note (Signed)
Unchanged, follow up with Dr. Estanislado Pandy as scheduled.

## 2014-03-17 NOTE — Assessment & Plan Note (Signed)
Stable on current meds. Continue same. BMET is up to date.

## 2014-03-18 ENCOUNTER — Telehealth: Payer: Self-pay | Admitting: Family

## 2014-03-18 DIAGNOSIS — R809 Proteinuria, unspecified: Secondary | ICD-10-CM

## 2014-03-18 MED ORDER — LISINOPRIL 5 MG PO TABS: 5.0000 mg | ORAL_TABLET | Freq: Every day | ORAL | Status: DC

## 2014-03-18 NOTE — Telephone Encounter (Signed)
Please contact pt and let him know that his urine is showing protein.  I would recommend that he add lisinopril once daily for renal protection.  He should stop potassium supplement. Follow up in 1 week so we can recheck his blood pressure and kidney function/potassium.

## 2014-03-18 NOTE — Telephone Encounter (Signed)
Notified pt and he voices understanding. Follow up scheduled for 03/25/14 at 2:15pm. Future lab orders entered.

## 2014-03-18 NOTE — Telephone Encounter (Signed)
Caller name: Kingjames  Call back number:[phone]   Reason for call:  In regards to information received this morning.  Cardiologist has concerns about medications lower bp.  Please call back.

## 2014-03-18 NOTE — Telephone Encounter (Signed)
Spoke with pt. He states cardiology mentioned that his BP was low at his last visit but they did not make any medication changes yet. Pt concerned that lisinopril may lower BP further. Advised pt to check BP with monitor at home and call if low BP readings at home. We will also see pt for follow up on 03/25/14 to check BP.

## 2014-03-20 ENCOUNTER — Ambulatory Visit (INDEPENDENT_AMBULATORY_CARE_PROVIDER_SITE_OTHER): Payer: Medicare Other | Admitting: *Deleted

## 2014-03-20 DIAGNOSIS — Z5181 Encounter for therapeutic drug level monitoring: Secondary | ICD-10-CM | POA: Diagnosis not present

## 2014-03-20 DIAGNOSIS — E1159 Type 2 diabetes mellitus with other circulatory complications: Secondary | ICD-10-CM | POA: Diagnosis not present

## 2014-03-20 DIAGNOSIS — I739 Peripheral vascular disease, unspecified: Secondary | ICD-10-CM | POA: Diagnosis not present

## 2014-03-20 DIAGNOSIS — Z7901 Long term (current) use of anticoagulants: Secondary | ICD-10-CM | POA: Diagnosis not present

## 2014-03-20 DIAGNOSIS — I4891 Unspecified atrial fibrillation: Secondary | ICD-10-CM

## 2014-03-20 DIAGNOSIS — L608 Other nail disorders: Secondary | ICD-10-CM | POA: Diagnosis not present

## 2014-03-20 DIAGNOSIS — L84 Corns and callosities: Secondary | ICD-10-CM | POA: Diagnosis not present

## 2014-03-20 LAB — POCT INR: INR: 2.2

## 2014-03-21 ENCOUNTER — Other Ambulatory Visit: Payer: Self-pay | Admitting: Family

## 2014-03-25 ENCOUNTER — Encounter: Payer: Self-pay | Admitting: Family

## 2014-03-25 ENCOUNTER — Ambulatory Visit (INDEPENDENT_AMBULATORY_CARE_PROVIDER_SITE_OTHER): Payer: Medicare Other | Admitting: Family

## 2014-03-25 VITALS — BP 130/58 | HR 49 | Temp 98.0°F | Resp 16 | Ht 72.0 in | Wt 291.8 lb

## 2014-03-25 DIAGNOSIS — I10 Essential (primary) hypertension: Secondary | ICD-10-CM

## 2014-03-25 DIAGNOSIS — N289 Disorder of kidney and ureter, unspecified: Secondary | ICD-10-CM | POA: Diagnosis not present

## 2014-03-25 NOTE — Progress Notes (Signed)
Pre visit review using our clinic review tool, if applicable. No additional management support is needed unless otherwise documented below in the visit note. 

## 2014-03-25 NOTE — Assessment & Plan Note (Signed)
BP stable continue current meds.  Obtain follow up BMET now that he is on lisinopril and kdur has been discontinued.

## 2014-03-25 NOTE — Patient Instructions (Addendum)
Please complete lab work prior to leaving. Follow up in 3 months.  

## 2014-03-25 NOTE — Progress Notes (Signed)
Subjective:    Patient ID: Bruce Cole, male    DOB: 08/18/1940, 73 y.o.   MRN: [name]  HPI  Bruce Cole is a 73 year old male who presents today for follow up  1.) HTN - Added lisinopril and has been taking regularly.  Here today for follow up, BP is 130/58.   2) Microalbuminuria- Urine showed protein last visit, needs follow up bmet today to assess renal function.    Review of Systems  Constitutional: Negative.  Negative for unexpected weight change.  HENT: Negative.   Eyes: Negative.   Respiratory: Negative for cough, chest tightness and shortness of breath.   Cardiovascular: Negative for chest pain, palpitations and leg swelling.  Gastrointestinal: Negative.   Genitourinary: Negative.   Neurological: Negative.  Negative for dizziness.  Psychiatric/Behavioral: Negative.    Past Medical History  Diagnosis Date  . Nephrolithiasis   . Arthritis     rheumatoid  . Colon polyp 2000  . Atrial fibrillation   . Unspecified essential hypertension   . Gout     History   Social History  . Marital Status: Single    Spouse Name: N/A    Number of Children: 3  . Years of Education: N/A   Occupational History  .     Social History Main Topics  . Smoking status: Never Smoker   . Smokeless tobacco: Never Used  . Alcohol Use: Yes     Comment: occasional  . Drug Use: No  . Sexual Activity: Not on file   Other Topics Concern  . 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 Spine surgery      x 2  . Joint replacement      Total L-Hip replacement, Right Knee 10/20/09  . Cholecystectomy  1994    Family History  Problem Relation Age of Onset  . Hypertension Mother   . Arthritis Mother     ?RA  . Hypertension Father     No Known Allergies  Current Outpatient Prescriptions on File Prior to Visit  Medication Sig Dispense Refill  . allopurinol (ZYLOPRIM) 100 MG  tablet TAKE 2 TABLETS BY MOUTH EVERY DAY  60 tablet  5  . amLODipine (NORVASC) 10 MG tablet TAKE 1 TABLET BY MOUTH EVERY DAY  30 tablet  2  . carvedilol (COREG) 12.5 MG tablet 1 tab PO in the AM and 1/2 tab PO in the evening  45 tablet  5  . furosemide (LASIX) 40 MG tablet TAKE 2 TABLETS BY MOUTH TWICE DAILY  120 tablet  2  . hydrALAZINE (APRESOLINE) 50 MG tablet TAKE 1 TABLET BY MOUTH THREE TIMES DAILY  90 tablet  1  . isosorbide mononitrate (IMDUR) 30 MG 24 hr tablet TAKE 1 TABLET BY MOUTH DAILY  30 tablet  6  . lisinopril (PRINIVIL,ZESTRIL) 5 MG tablet Take 1 tablet (5 mg total) by mouth daily.  30 tablet  0  . metolazone (ZAROXOLYN) 2.5 MG tablet Take one daily 30 min prior to your morning fluid pill dose 2 days a week.  10 tablet  11  . Multiple Vitamins-Minerals (PX MENS MULTIVITAMINS) TABS Take 1 tablet by mouth daily.      . ondansetron (ZOFRAN) 4 MG tablet Take 1 tablet (4 mg total) by mouth every 6 (six) hours as needed for nausea.  20 tablet  0  . sildenafil (  VIAGRA) 100 MG tablet Take 100 mg by mouth daily as needed for erectile dysfunction.       [name] spironolactone (ALDACTONE) 25 MG tablet TAKE 1 TABLET BY MOUTH EVERY DAY  30 tablet  6  . terazosin (HYTRIN) 1 MG capsule TAKE 1 CAPSULE BY MOUTH EVERY NIGHT AT BEDTIME  30 capsule  2  . traMADol (ULTRAM) 50 MG tablet Take 50 mg by mouth Twice daily.      . valACYclovir (VALTREX) 500 MG tablet Take 1 tablet (500 mg total) by mouth daily.  30 tablet  5  . warfarin (COUMADIN) 7.5 MG tablet TAKE AS DIRECTED BY COUMADIN CLINIC  40 tablet  0   No current facility-administered medications on file prior to visit.    BP 130/58  Pulse 49  Temp(Src) 98 F (36.7 C) (Oral)  Resp 16  Ht 6' (1.829 m)  Wt 291 lb 12.8 oz (132.36 kg)  BMI 39.57 kg/m2  SpO2 96%       Objective:   Physical Exam  Constitutional: He is oriented to person, place, and time. He appears well-developed and well-nourished. No distress.  HENT:  Head: Normocephalic.    Eyes: EOM are normal. Pupils are equal, round, and reactive to light.  Neck: Normal range of motion. Neck supple. No JVD present. No thyromegaly present.  Cardiovascular: Normal rate, regular rhythm, normal heart sounds and intact distal pulses.   Pulmonary/Chest: Effort normal and breath sounds normal. No respiratory distress. He has no wheezes.  Abdominal: Soft. Bowel sounds are normal.  Musculoskeletal: Normal range of motion. He exhibits no edema and no tenderness.  Neurological: He is alert and oriented to person, place, and time.  Skin: Skin is warm and dry.          Assessment & Plan:  I have personally seen and examined patient and agree with Jettie Booze NP student's assessment and plan.

## 2014-03-25 NOTE — Assessment & Plan Note (Signed)
+   microalbuminuria- continue ace, discussed importance ongoing tight BP and Sugar control.

## 2014-03-26 ENCOUNTER — Telehealth: Payer: Self-pay | Admitting: Family

## 2014-03-26 DIAGNOSIS — N289 Disorder of kidney and ureter, unspecified: Secondary | ICD-10-CM

## 2014-03-26 LAB — BASIC METABOLIC PANEL
BUN: 49 mg/dL — ABNORMAL HIGH (ref 6–23)
CO2: 32 mEq/L (ref 19–32)
Calcium: 9.7 mg/dL (ref 8.4–10.5)
Chloride: 94 mEq/L — ABNORMAL LOW (ref 96–112)
Creatinine, Ser: 1.9 mg/dL — ABNORMAL HIGH (ref 0.4–1.5)
GFR: 46.59 mL/min — ABNORMAL LOW (ref >60.00)
Glucose, Bld: 99 mg/dL (ref 70–99)
Potassium: 3.9 mEq/L (ref 3.5–5.1)
SODIUM: 134 meq/L — AB (ref 135–145)

## 2014-03-26 NOTE — Telephone Encounter (Signed)
Tramadol should not affect kidneys.  Should still stop lisinopril.

## 2014-03-26 NOTE — Telephone Encounter (Signed)
Notified pt. He denies OTC pain meds but states he has been taking tramadol three times daily x 3 weeks. Can this affect kidney function? If so, does he still need to stop Lisinopril?

## 2014-03-26 NOTE — Telephone Encounter (Addendum)
Please contact pt and let him know that his kidney function has worsened slightly. I would like him to stop lisinopril, repeat bmet in 2 weeks. Dx renal insufficiency. Also, make sure that he is not taking any NSAIDS.

## 2014-03-27 NOTE — Telephone Encounter (Signed)
Left detailed message on home # and to call and schedule lab appt in 2 weeks. Future lab order entered.

## 2014-03-28 DIAGNOSIS — M1710 Unilateral primary osteoarthritis, unspecified knee: Secondary | ICD-10-CM | POA: Diagnosis not present

## 2014-03-28 DIAGNOSIS — M1610 Unilateral primary osteoarthritis, unspecified hip: Secondary | ICD-10-CM | POA: Diagnosis not present

## 2014-03-28 DIAGNOSIS — M069 Rheumatoid arthritis, unspecified: Secondary | ICD-10-CM | POA: Diagnosis not present

## 2014-03-28 DIAGNOSIS — M169 Osteoarthritis of hip, unspecified: Secondary | ICD-10-CM | POA: Diagnosis not present

## 2014-03-28 DIAGNOSIS — M109 Gout, unspecified: Secondary | ICD-10-CM | POA: Diagnosis not present

## 2014-04-10 ENCOUNTER — Encounter: Payer: Self-pay | Admitting: Family

## 2014-04-10 ENCOUNTER — Other Ambulatory Visit (INDEPENDENT_AMBULATORY_CARE_PROVIDER_SITE_OTHER): Payer: Medicare Other

## 2014-04-10 DIAGNOSIS — R809 Proteinuria, unspecified: Secondary | ICD-10-CM | POA: Diagnosis not present

## 2014-04-10 LAB — BASIC METABOLIC PANEL
BUN: 41 mg/dL — ABNORMAL HIGH (ref 6–23)
CHLORIDE: 101 meq/L (ref 96–112)
CO2: 27 mEq/L (ref 19–32)
Calcium: 9.5 mg/dL (ref 8.4–10.5)
Creatinine, Ser: 1.7 mg/dL — ABNORMAL HIGH (ref 0.4–1.5)
GFR: 51.71 mL/min — AB (ref >60.00)
Glucose, Bld: 125 mg/dL — ABNORMAL HIGH (ref 70–99)
POTASSIUM: 3.7 meq/L (ref 3.5–5.1)
Sodium: 137 mEq/L (ref 135–145)

## 2014-04-12 ENCOUNTER — Other Ambulatory Visit: Payer: Self-pay | Admitting: Family

## 2014-04-14 NOTE — Telephone Encounter (Signed)
Rx request to pharmacy/SLS  

## 2014-04-16 ENCOUNTER — Other Ambulatory Visit: Payer: Self-pay | Admitting: Family

## 2014-04-17 ENCOUNTER — Ambulatory Visit (INDEPENDENT_AMBULATORY_CARE_PROVIDER_SITE_OTHER): Payer: Medicare Other

## 2014-04-17 DIAGNOSIS — Z5181 Encounter for therapeutic drug level monitoring: Secondary | ICD-10-CM

## 2014-04-17 DIAGNOSIS — I4891 Unspecified atrial fibrillation: Secondary | ICD-10-CM

## 2014-04-17 DIAGNOSIS — Z7901 Long term (current) use of anticoagulants: Secondary | ICD-10-CM

## 2014-04-17 LAB — POCT INR: INR: 2.2

## 2014-04-18 ENCOUNTER — Telehealth: Payer: Self-pay | Admitting: Family

## 2014-04-18 NOTE — Telephone Encounter (Signed)
Caller name: Lamor Relation to pt: self Call back number: [phone] Pharmacy:  Reason for call:   Patient has additional questions regarding last labs

## 2014-04-18 NOTE — Telephone Encounter (Signed)
Spoke with pt and answered questions. Advised him per verbal from PRovider that GFR is a measurement of the kidney filtration rate based on BUN, Creatinine, etc. . . .

## 2014-04-20 ENCOUNTER — Other Ambulatory Visit: Payer: Self-pay | Admitting: Internal Medicine

## 2014-05-04 ENCOUNTER — Other Ambulatory Visit: Payer: Self-pay | Admitting: Cardiology

## 2014-05-15 ENCOUNTER — Ambulatory Visit (INDEPENDENT_AMBULATORY_CARE_PROVIDER_SITE_OTHER): Payer: Medicare Other | Admitting: *Deleted

## 2014-05-15 DIAGNOSIS — Z5181 Encounter for therapeutic drug level monitoring: Secondary | ICD-10-CM

## 2014-05-15 DIAGNOSIS — I4891 Unspecified atrial fibrillation: Secondary | ICD-10-CM | POA: Diagnosis not present

## 2014-05-15 DIAGNOSIS — Z7901 Long term (current) use of anticoagulants: Secondary | ICD-10-CM

## 2014-05-15 LAB — POCT INR: INR: 2.8

## 2014-05-26 DIAGNOSIS — N183 Chronic kidney disease, stage 3 (moderate): Secondary | ICD-10-CM | POA: Diagnosis not present

## 2014-06-02 ENCOUNTER — Other Ambulatory Visit: Payer: Self-pay | Admitting: Cardiology

## 2014-06-05 ENCOUNTER — Other Ambulatory Visit (HOSPITAL_COMMUNITY): Payer: Self-pay | Admitting: Internal Medicine

## 2014-06-05 DIAGNOSIS — I739 Peripheral vascular disease, unspecified: Secondary | ICD-10-CM | POA: Diagnosis not present

## 2014-06-05 DIAGNOSIS — L603 Nail dystrophy: Secondary | ICD-10-CM | POA: Diagnosis not present

## 2014-06-05 DIAGNOSIS — I5022 Chronic systolic (congestive) heart failure: Secondary | ICD-10-CM

## 2014-06-06 ENCOUNTER — Ambulatory Visit (HOSPITAL_COMMUNITY)
Admission: RE | Admit: 2014-06-06 | Discharge: 2014-06-06 | Disposition: A | Discharge status: Home / Self Care | Payer: Medicare Other | Source: Ambulatory Visit | Attending: Cardiology | Admitting: Cardiology

## 2014-06-06 ENCOUNTER — Ambulatory Visit (INDEPENDENT_AMBULATORY_CARE_PROVIDER_SITE_OTHER): Payer: Medicare Other

## 2014-06-06 VITALS — BP 118/68 | HR 45 | Wt 284.0 lb

## 2014-06-06 DIAGNOSIS — Z7901 Long term (current) use of anticoagulants: Secondary | ICD-10-CM

## 2014-06-06 DIAGNOSIS — G4733 Obstructive sleep apnea (adult) (pediatric): Secondary | ICD-10-CM | POA: Diagnosis not present

## 2014-06-06 DIAGNOSIS — I4891 Unspecified atrial fibrillation: Secondary | ICD-10-CM

## 2014-06-06 DIAGNOSIS — I5022 Chronic systolic (congestive) heart failure: Secondary | ICD-10-CM | POA: Diagnosis not present

## 2014-06-06 DIAGNOSIS — I5042 Chronic combined systolic (congestive) and diastolic (congestive) heart failure: Secondary | ICD-10-CM

## 2014-06-06 DIAGNOSIS — Z5181 Encounter for therapeutic drug level monitoring: Secondary | ICD-10-CM

## 2014-06-06 DIAGNOSIS — I482 Chronic atrial fibrillation, unspecified: Secondary | ICD-10-CM

## 2014-06-06 DIAGNOSIS — N189 Chronic kidney disease, unspecified: Secondary | ICD-10-CM | POA: Insufficient documentation

## 2014-06-06 LAB — POCT INR: INR: 3.4

## 2014-06-06 NOTE — Progress Notes (Signed)
Patient ID: Bruce Cole, male   DOB: 27-Jul-1940, 73 y.o.   MRN: [name]  HPI: Bruce Cole is a 73 yo male with a history of chronic atrial fibrillation, HTN and rheumatoid arthritis. He has been on chronic coumadin therapy. Primarily diastolic CHF with prominent RV failure.   R/LHC 02/2013 RA = 28 with prominent v-waves  RV = 67/21/28  PA = 70/23 (42)  PCW = 29  Fick cardiac output/index = 7.5/2.8  PVR = 1.8 Woods  SVR = 610  FA sat = 96%  PA sat = 64%, 70%  No RV LV interaction  Near equalization of RV, LV and RA diastolic pressures ** Essentially normal coronary arteries**  Echo 03/2013 EF 45-50% with grade II diastolic dysfunction, moderate to severe RV dilation with mild systolic dysfunction, PA systolic pressure 60 mmHg.   Since last appointment, he has been stable.  HR in the 40s-50s but this is chronic for him x years.  No lightheadedness or syncope.  Weight is down 4 lbs.  He denies exertional dyspnea or chest pain.  No orthopnea or PND.  He has OSA but is unable to tolerate CPAP.   He has been going to the gym daily and working out of 20 minutes on the treadmill, 1 hour on the recumbent bike, and 20 minutes of rowing. He golfs 2 days a week.   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  Past Medical History  Diagnosis Date  . Nephrolithiasis   . Arthritis     rheumatoid  . Colon polyp 2000  . Atrial fibrillation   . Unspecified essential hypertension   . Gout     Current Outpatient Prescriptions  Medication Sig Dispense Refill  .  allopurinol (ZYLOPRIM) 100 MG tablet TAKE 2 TABLETS BY MOUTH DAILY 60 tablet 2  . amLODipine (NORVASC) 10 MG tablet TAKE 1 TABLET BY MOUTH EVERY DAY 30 tablet 2  . carvedilol (COREG) 12.5 MG tablet 1 tab PO in the AM and 1/2 tab PO in the evening 45 tablet 5  . furosemide (LASIX) 40 MG tablet Take 2 tabs in AM and 1 tab in PM    . hydrALAZINE (APRESOLINE) 50 MG tablet TAKE 1 TABLET BY MOUTH THREE TIMES DAILY 90 tablet 2  . metolazone (ZAROXOLYN) 2.5 MG tablet Take one daily 30 min prior to your morning fluid pill dose 2 days a week. 10 tablet 11  . Multiple Vitamins-Minerals (PX MENS MULTIVITAMINS) TABS Take 1 tablet by mouth daily.    . ondansetron (ZOFRAN) 4 MG tablet Take 1 tablet (4 mg total) by mouth every 6 (six) hours as needed for nausea. 20 tablet 0  . potassium chloride SA (K-DUR,KLOR-CON) 20 MEQ tablet TAKE 1 TABLET BY MOUTH THREE TIMES DAILY 90 tablet 6  . sildenafil (VIAGRA) 100 MG tablet Take  100 mg by mouth daily as needed for erectile dysfunction.     [name] terazosin (HYTRIN) 1 MG capsule TAKE 1 CAPSULE BY MOUTH EVERY NIGHT AT BEDTIME 30 capsule 2  . valACYclovir (VALTREX) 500 MG tablet Take 1 tablet (500 mg total) by mouth daily. 30 tablet 5  . warfarin (COUMADIN) 7.5 MG tablet TAKE AS DIRECTED BY COUMADIN CLINIC 40 tablet 2  . isosorbide mononitrate (IMDUR) 30 MG 24 hr tablet TAKE 1 TABLET BY MOUTH EVERY DAY 30 tablet 0  . spironolactone (ALDACTONE) 25 MG tablet TAKE 1 TABLET BY MOUTH EVERY DAY 30 tablet 6   No current facility-administered medications for this encounter.   PHYSICAL EXAM: Filed Vitals:   06/06/14 0933  BP: 118/68  Pulse: 45  Weight: 284 lb (128.822 kg)  SpO2: 96%   General:  Well appearing. No resp difficulty. Ambulated into clinic with a cane.  HEENT: normal Neck: supple. JVP 7 cm. Carotids 2+ bilaterally; no bruits. No lymphadenopathy or thryomegaly appreciated. Cor: PMI normal. Bradycardic, regular rate & rhythm. No rubs, gallops.  2/6 early SEM  RUSB Lungs: clear Abdomen: soft, nontender, nondistended. No hepatosplenomegaly. No bruits or masses. Good bowel sounds. Extremities: no cyanosis, clubbing, rash,  no edema Neuro: alert & orientedx3, cranial nerves grossly intact. Moves all 4 extremities w/o difficulty. Affect pleasant.  ASSESSMENT & PLAN: 1) Chronic systolic HF with prominent RV failure: Echo 03/2013 EF 45-50% with grade II diastolic dysfunction, moderate to severe RV dilation with mild systolic dysfunction, PA systolic pressure 60 mmHg. Prominent RV involvement.  RHC/LHC in 8/14 with no CAD, suggestive of restrictive physiology, and pulmonary venous hypertension (low PVR). NYHA II symptoms. Weight is down without volume overload on exam.   - Continue lasix 80 qam, 40 qpm and metolazone twice a week.  - Continue Coreg 12.5 mg qam, 6.25 mg qpm.  - Continue hydralazine 50 mg tid and Imdur 30 mg daily.  He understands he should not use Viagra while on Imdur . - He had recent BMET at nephrology office.  I will try to get this.    2) OSA: Unable to tolerate CPAP (has tried multiple masks).  3) Atrial fibrillation: Chronic, rate controlled. Continue coumadin. Not interested in NOAC.  4) CKD: Stable creatinine recently.   [name] 06/06/2014

## 2014-06-06 NOTE — Patient Instructions (Addendum)
Doing Great, have a Happy Holidays!  Your physician recommends that you schedule a follow-up appointment in: 3 months  Do the following things EVERYDAY: 1) Weigh yourself in the morning before breakfast. Write it down and keep it in a log. 2) Take your medicines as prescribed 3) Eat low salt foods-Limit salt (sodium) to 2000 mg per day.  4) Stay as active as you can everyday 5) Limit all fluids for the day to less than 2 liters 6)

## 2014-06-14 ENCOUNTER — Other Ambulatory Visit: Payer: Self-pay | Admitting: Family

## 2014-06-15 ENCOUNTER — Other Ambulatory Visit: Payer: Self-pay | Admitting: Family

## 2014-06-16 ENCOUNTER — Ambulatory Visit (INDEPENDENT_AMBULATORY_CARE_PROVIDER_SITE_OTHER): Payer: Medicare Other | Admitting: Family

## 2014-06-16 ENCOUNTER — Encounter: Payer: Self-pay | Admitting: Family

## 2014-06-16 VITALS — BP 136/65 | HR 44 | Temp 98.2°F | Resp 16 | Ht 72.0 in | Wt 286.0 lb

## 2014-06-16 DIAGNOSIS — M069 Rheumatoid arthritis, unspecified: Secondary | ICD-10-CM | POA: Diagnosis not present

## 2014-06-16 DIAGNOSIS — R739 Hyperglycemia, unspecified: Secondary | ICD-10-CM | POA: Diagnosis not present

## 2014-06-16 DIAGNOSIS — A6000 Herpesviral infection of urogenital system, unspecified: Secondary | ICD-10-CM | POA: Diagnosis not present

## 2014-06-16 DIAGNOSIS — N289 Disorder of kidney and ureter, unspecified: Secondary | ICD-10-CM | POA: Diagnosis not present

## 2014-06-16 DIAGNOSIS — I5042 Chronic combined systolic (congestive) and diastolic (congestive) heart failure: Secondary | ICD-10-CM | POA: Diagnosis not present

## 2014-06-16 DIAGNOSIS — I10 Essential (primary) hypertension: Secondary | ICD-10-CM

## 2014-06-16 DIAGNOSIS — G4733 Obstructive sleep apnea (adult) (pediatric): Secondary | ICD-10-CM | POA: Diagnosis not present

## 2014-06-16 NOTE — Patient Instructions (Signed)
Please schedule lab visit at the front desk. Please schedule a follow up appointment in 3 months.

## 2014-06-16 NOTE — Telephone Encounter (Signed)
Furosemide request on hold. Pharmacy directions say 2 tablets twice a day. Med list states 2 tabs in the morning and 1 tab in the evening. Left detailed message requesting pt call us back with this information.

## 2014-06-16 NOTE — Assessment & Plan Note (Signed)
BP stable, but he is significantly bradycardic.  Will decrease AM dose of carvedilol from 12.5 to 6.25.

## 2014-06-16 NOTE — Assessment & Plan Note (Signed)
Stable on suppressive therapy. Continue same

## 2014-06-16 NOTE — Assessment & Plan Note (Signed)
Stable, management per rheumatology.  

## 2014-06-16 NOTE — Assessment & Plan Note (Signed)
Clinically compensated.

## 2014-06-16 NOTE — Progress Notes (Signed)
Subjective:    Patient ID: Bruce Cole, male    DOB: 03-11-1941, 73 y.o.   MRN: [name]  HPI  [name] is a 73 yr old male who presents today for follow up.   HTN-  Current BP meds include amlodipine, carvedilol, furosemide, hydralazine, aldactone, hytrin.    BP Readings from Last 3 Encounters:  06/16/14 136/65  06/06/14 118/68  03/25/14 130/58   Renal insufficiency- following with Dr. [name].   Lab Results  Component Value Date   CREATININE 1.7* 04/10/2014   HSV- on daily valtrex. No breakouts  CHF-  Denies CP or swelling Wt Readings from Last 3 Encounters:  06/16/14 286 lb (129.729 kg)  06/06/14 284 lb (128.822 kg)  03/25/14 291 lb 12.8 oz (132.36 kg)   RA-  Following with rheumatology. Stopped tramadol due to constipation, dizziness.  He follows with Dr. Estanislado Pandy.  He is maintained on plaquenil.  He will call me with the dose for our records.     Review of Systems See HPI    Past Medical History  Diagnosis Date  . Nephrolithiasis   . Arthritis     rheumatoid  . Colon polyp 2000  . Atrial fibrillation   . Unspecified essential hypertension   . Gout     History   Social History  . Marital Status: Single    Spouse Name: N/A    Number of Children: 3  . Years of Education: N/A   Occupational History  .     Social History Main Topics  . Smoking status: Never Smoker   . Smokeless tobacco: Never Used  . Alcohol Use: Yes     Comment: occasional  . Drug Use: No  . Sexual Activity: Not on file   Other Topics Concern  . 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 Spine surgery      x 2  . Joint replacement      Total L-Hip replacement, Right Knee 10/20/09  . Cholecystectomy  1994    Family History  Problem Relation Age of Onset  . Hypertension Mother   . Arthritis Mother     ?RA  . Hypertension Father     Allergies  Allergen  Reactions  . Ace Inhibitors     Worsening renal insufficiency    Current Outpatient Prescriptions on File Prior to Visit  Medication Sig Dispense Refill  . allopurinol (ZYLOPRIM) 100 MG tablet TAKE 2 TABLETS BY MOUTH DAILY 60 tablet 2  . amLODipine (NORVASC) 10 MG tablet TAKE 1 TABLET BY MOUTH EVERY DAY 30 tablet 2  . carvedilol (COREG) 12.5 MG tablet 1 tab PO in the AM and 1/2 tab PO in the evening 45 tablet 5  . furosemide (LASIX) 40 MG tablet Take 2 tabs in AM and 1 tab in PM    . hydrALAZINE (APRESOLINE) 50 MG tablet TAKE 1 TABLET BY MOUTH THREE TIMES DAILY 90 tablet 2  . isosorbide mononitrate (IMDUR) 30 MG 24 hr tablet TAKE 1 TABLET BY MOUTH EVERY DAY 30 tablet 0  . metolazone (ZAROXOLYN) 2.5 MG tablet Take one daily 30 min prior to your morning fluid pill dose 2 days a week. 10 tablet 11  . Multiple Vitamins-Minerals (PX MENS MULTIVITAMINS) TABS Take 1 tablet by mouth daily.    . ondansetron (ZOFRAN) 4 MG tablet Take 1 tablet (4 mg total) by  mouth every 6 (six) hours as needed for nausea. 20 tablet 0  . potassium chloride SA (K-DUR,KLOR-CON) 20 MEQ tablet TAKE 1 TABLET BY MOUTH THREE TIMES DAILY 90 tablet 6  . sildenafil (VIAGRA) 100 MG tablet Take 100 mg by mouth daily as needed for erectile dysfunction.     [name] spironolactone (ALDACTONE) 25 MG tablet TAKE 1 TABLET BY MOUTH EVERY DAY 30 tablet 6  . terazosin (HYTRIN) 1 MG capsule TAKE 1 CAPSULE BY MOUTH EVERY NIGHT AT BEDTIME 30 capsule 2  . valACYclovir (VALTREX) 500 MG tablet Take 1 tablet (500 mg total) by mouth daily. 30 tablet 5  . warfarin (COUMADIN) 7.5 MG tablet TAKE AS DIRECTED BY COUMADIN CLINIC 40 tablet 2   No current facility-administered medications on file prior to visit.    BP 136/65 mmHg  Pulse 44  Temp(Src) 98.2 F (36.8 C) (Oral)  Resp 16  Ht 6' (1.829 m)  Wt 286 lb (129.729 kg)  BMI 38.78 kg/m2  SpO2 100%    Objective:   Physical Exam  Constitutional: He is oriented to person, place, and time. He  appears well-developed and well-nourished. No distress.  HENT:  Head: Normocephalic and atraumatic.  Cardiovascular: Normal rate and regular rhythm.   No murmur heard. Pulmonary/Chest: Effort normal and breath sounds normal. No respiratory distress. He has no wheezes. He has no rales. He exhibits no tenderness.  Neurological: He is alert and oriented to person, place, and time.  Psychiatric: He has a normal mood and affect. His behavior is normal. Judgment and thought content normal.          Assessment & Plan:

## 2014-06-16 NOTE — Progress Notes (Signed)
Pre visit review using our clinic review tool, if applicable. No additional management support is needed unless otherwise documented below in the visit note. 

## 2014-06-16 NOTE — Assessment & Plan Note (Signed)
Follow up creatinine at Dr. Abel Presto office improved at 1.5.

## 2014-06-19 DIAGNOSIS — Z79899 Other long term (current) drug therapy: Secondary | ICD-10-CM | POA: Diagnosis not present

## 2014-06-23 ENCOUNTER — Other Ambulatory Visit (INDEPENDENT_AMBULATORY_CARE_PROVIDER_SITE_OTHER): Payer: Medicare Other

## 2014-06-23 DIAGNOSIS — N289 Disorder of kidney and ureter, unspecified: Secondary | ICD-10-CM

## 2014-06-23 DIAGNOSIS — R739 Hyperglycemia, unspecified: Secondary | ICD-10-CM | POA: Diagnosis not present

## 2014-06-23 LAB — BASIC METABOLIC PANEL
BUN: 50 mg/dL — ABNORMAL HIGH (ref 6–23)
CALCIUM: 9.9 mg/dL (ref 8.4–10.5)
CO2: 29 mEq/L (ref 19–32)
CREATININE: 1.9 mg/dL — AB (ref 0.4–1.5)
Chloride: 100 mEq/L (ref 96–112)
GFR: 44.08 mL/min — ABNORMAL LOW (ref >60.00)
GLUCOSE: 115 mg/dL — AB (ref 70–99)
Potassium: 4 mEq/L (ref 3.5–5.1)
SODIUM: 138 meq/L (ref 135–145)

## 2014-06-23 LAB — HEMOGLOBIN A1C: Hgb A1c MFr Bld: 6.7 % — ABNORMAL HIGH (ref 4.6–6.5)

## 2014-06-24 ENCOUNTER — Encounter: Payer: Self-pay | Admitting: Family

## 2014-06-25 ENCOUNTER — Ambulatory Visit: Payer: Medicare Other | Admitting: Family

## 2014-06-26 ENCOUNTER — Encounter (HOSPITAL_COMMUNITY): Payer: Self-pay | Admitting: Internal Medicine

## 2014-06-27 ENCOUNTER — Ambulatory Visit (INDEPENDENT_AMBULATORY_CARE_PROVIDER_SITE_OTHER): Payer: Medicare Other | Admitting: *Deleted

## 2014-06-27 ENCOUNTER — Other Ambulatory Visit: Payer: Self-pay | Admitting: Family

## 2014-06-27 DIAGNOSIS — I4891 Unspecified atrial fibrillation: Secondary | ICD-10-CM

## 2014-06-27 DIAGNOSIS — I482 Chronic atrial fibrillation, unspecified: Secondary | ICD-10-CM

## 2014-06-27 DIAGNOSIS — Z7901 Long term (current) use of anticoagulants: Secondary | ICD-10-CM

## 2014-06-27 DIAGNOSIS — Z5181 Encounter for therapeutic drug level monitoring: Secondary | ICD-10-CM

## 2014-06-27 LAB — POCT INR: INR: 3

## 2014-06-27 NOTE — Telephone Encounter (Signed)
Please advise zofran refill?

## 2014-07-01 ENCOUNTER — Telehealth: Payer: Self-pay | Admitting: Family

## 2014-07-01 DIAGNOSIS — G4733 Obstructive sleep apnea (adult) (pediatric): Secondary | ICD-10-CM

## 2014-07-01 NOTE — Telephone Encounter (Signed)
Caller name: Kedrick, Jesmer Relation to pt: self  Call back number: [phone]   Reason for call:  Pt checking the status of C-pap machine, pt stated NP recommended a person who was going to be calling pt regarding machine

## 2014-07-02 NOTE — Telephone Encounter (Signed)
Contacted pt.   Last sleep study was 1 year ago.  Wants nasal cpap.Does not currently have a machine at home. Could you please request sleep study report from [facility]?

## 2014-07-03 ENCOUNTER — Other Ambulatory Visit: Payer: Self-pay | Admitting: Family

## 2014-07-03 NOTE — Telephone Encounter (Signed)
Terazosin Last filled 04/10/14 Amt: 30, 2 refills Last OV: 06/16/14 Last BMP:  04/10/14  Med filled.

## 2014-07-04 NOTE — Telephone Encounter (Signed)
Report printed and forwarded to [facility] for review.

## 2014-07-08 ENCOUNTER — Other Ambulatory Visit (HOSPITAL_COMMUNITY): Payer: Self-pay | Admitting: Internal Medicine

## 2014-07-08 ENCOUNTER — Other Ambulatory Visit: Payer: Self-pay | Admitting: Family

## 2014-07-08 ENCOUNTER — Other Ambulatory Visit: Payer: Self-pay | Admitting: Internal Medicine

## 2014-07-09 NOTE — Addendum Note (Signed)
Addended by: Debbrah Alar on: 07/09/2014 01:26 PM   Modules accepted: Orders

## 2014-07-09 NOTE — Telephone Encounter (Signed)
imdur and sildenafil on medication list Ok to refill imdur?

## 2014-07-14 ENCOUNTER — Telehealth (HOSPITAL_COMMUNITY): Payer: Self-pay | Admitting: Vascular Surgery

## 2014-07-14 ENCOUNTER — Other Ambulatory Visit (HOSPITAL_COMMUNITY): Payer: Self-pay

## 2014-07-14 DIAGNOSIS — I5022 Chronic systolic (congestive) heart failure: Secondary | ICD-10-CM

## 2014-07-14 MED ORDER — ISOSORBIDE MONONITRATE ER 30 MG PO TB24: 30.0000 mg | ORAL_TABLET | Freq: Every day | ORAL | Status: DC

## 2014-07-14 NOTE — Telephone Encounter (Signed)
Refill Isosorbide

## 2014-07-21 ENCOUNTER — Other Ambulatory Visit: Payer: Self-pay | Admitting: Family

## 2014-07-22 NOTE — Telephone Encounter (Signed)
Rx request to pharmacy/SLS  

## 2014-07-25 ENCOUNTER — Ambulatory Visit (INDEPENDENT_AMBULATORY_CARE_PROVIDER_SITE_OTHER): Payer: Medicare Other | Admitting: *Deleted

## 2014-07-25 DIAGNOSIS — Z5181 Encounter for therapeutic drug level monitoring: Secondary | ICD-10-CM | POA: Diagnosis not present

## 2014-07-25 DIAGNOSIS — I482 Chronic atrial fibrillation, unspecified: Secondary | ICD-10-CM

## 2014-07-25 DIAGNOSIS — I4891 Unspecified atrial fibrillation: Secondary | ICD-10-CM

## 2014-07-25 DIAGNOSIS — Z7901 Long term (current) use of anticoagulants: Secondary | ICD-10-CM | POA: Diagnosis not present

## 2014-07-25 LAB — POCT INR: INR: 2.6

## 2014-08-15 ENCOUNTER — Other Ambulatory Visit: Payer: Self-pay | Admitting: Family

## 2014-08-15 NOTE — Telephone Encounter (Signed)
eScribe request from [facility] for refill on Colchicine Last filled - 08.03.15. #30x3 Last AEX - 11.30.15 Next AEX - 3-Mths Please Advise on refills/SLS

## 2014-08-18 DIAGNOSIS — I739 Peripheral vascular disease, unspecified: Secondary | ICD-10-CM | POA: Diagnosis not present

## 2014-08-18 DIAGNOSIS — L603 Nail dystrophy: Secondary | ICD-10-CM | POA: Diagnosis not present

## 2014-08-21 ENCOUNTER — Other Ambulatory Visit: Payer: Self-pay | Admitting: Family

## 2014-08-21 NOTE — Telephone Encounter (Signed)
Med already filled

## 2014-08-22 ENCOUNTER — Ambulatory Visit (INDEPENDENT_AMBULATORY_CARE_PROVIDER_SITE_OTHER): Payer: Medicare Other

## 2014-08-22 ENCOUNTER — Telehealth: Payer: Self-pay | Admitting: Family

## 2014-08-22 DIAGNOSIS — I4891 Unspecified atrial fibrillation: Secondary | ICD-10-CM | POA: Diagnosis not present

## 2014-08-22 DIAGNOSIS — Z7901 Long term (current) use of anticoagulants: Secondary | ICD-10-CM

## 2014-08-22 DIAGNOSIS — Z5181 Encounter for therapeutic drug level monitoring: Secondary | ICD-10-CM

## 2014-08-22 LAB — POCT INR: INR: 2.4

## 2014-08-22 NOTE — Telephone Encounter (Signed)
Patient calling in on this. He is out and is requesting a refill.

## 2014-08-22 NOTE — Telephone Encounter (Signed)
Refill sent, left detailed message on pt's home # and to call if any questions.

## 2014-09-04 ENCOUNTER — Other Ambulatory Visit (HOSPITAL_COMMUNITY): Payer: Self-pay | Admitting: Cardiology

## 2014-09-11 ENCOUNTER — Other Ambulatory Visit: Payer: Self-pay | Admitting: Family

## 2014-09-15 ENCOUNTER — Ambulatory Visit (INDEPENDENT_AMBULATORY_CARE_PROVIDER_SITE_OTHER): Payer: Medicare Other | Admitting: Family

## 2014-09-15 ENCOUNTER — Other Ambulatory Visit: Payer: Self-pay | Admitting: Family

## 2014-09-15 ENCOUNTER — Encounter: Payer: Self-pay | Admitting: Family

## 2014-09-15 VITALS — BP 126/72 | HR 45 | Temp 98.2°F | Resp 16 | Ht 72.0 in | Wt 302.6 lb

## 2014-09-15 DIAGNOSIS — H6120 Impacted cerumen, unspecified ear: Secondary | ICD-10-CM | POA: Diagnosis not present

## 2014-09-15 DIAGNOSIS — I10 Essential (primary) hypertension: Secondary | ICD-10-CM

## 2014-09-15 DIAGNOSIS — N289 Disorder of kidney and ureter, unspecified: Secondary | ICD-10-CM

## 2014-09-15 DIAGNOSIS — J309 Allergic rhinitis, unspecified: Secondary | ICD-10-CM

## 2014-09-15 DIAGNOSIS — I5042 Chronic combined systolic (congestive) and diastolic (congestive) heart failure: Secondary | ICD-10-CM

## 2014-09-15 DIAGNOSIS — E118 Type 2 diabetes mellitus with unspecified complications: Secondary | ICD-10-CM | POA: Diagnosis not present

## 2014-09-15 DIAGNOSIS — G4733 Obstructive sleep apnea (adult) (pediatric): Secondary | ICD-10-CM

## 2014-09-15 MED ORDER — FLUTICASONE PROPIONATE 50 MCG/ACT NA SUSP: 2.0000 | Freq: Every day | NASAL | Status: DC

## 2014-09-15 NOTE — Assessment & Plan Note (Addendum)
BP looks great. Chronic bradycardia- asymptomatic.  Continue current meds.

## 2014-09-15 NOTE — Assessment & Plan Note (Signed)
Stable, management per nephrology.  

## 2014-09-15 NOTE — Assessment & Plan Note (Signed)
Not currently on cpap, will repeat sleep study for medicare purposes.

## 2014-09-15 NOTE — Assessment & Plan Note (Signed)
Discussed use or cerumen softening kit such as debrox.

## 2014-09-15 NOTE — Assessment & Plan Note (Signed)
Recommend trial of flonas.

## 2014-09-15 NOTE — Patient Instructions (Signed)
You will be contacted about your referral for sleep study. Please schedule a follow up appointment in 3 months.

## 2014-09-15 NOTE — Telephone Encounter (Signed)
Rx request to pharmacy/SLS  

## 2014-09-15 NOTE — Assessment & Plan Note (Signed)
Stable, too soon to recheck a1C, continue diabetic diet.

## 2014-09-15 NOTE — Progress Notes (Signed)
Subjective:    Patient ID: Bruce Cole, male    DOB: 11-Oct-1940, 74 y.o.   MRN: [name]  HPI  Mr.  Bruce Cole is a 74 yr old male who presents today for follow up.  1) HTN- Patient is currently maintained on the following medications for blood pressure: coreg, amlodipine, hydralazine, hytrin. Patient reports good compliance with blood pressure medications. Patient denies chest pain, shortness of breath or swelling. Last 3 blood pressure readings in our office are as follows:  2) DM2- Lab Results  Component Value Date   HGBA1C 6.7* 06/23/2014   3) Renal insufficiency-   Lab Results  Component Value Date   CREATININE 1.9* 06/23/2014   4) Nasal Congestion-  Reports + nasal congestion, also ears feel full x 4 days.  5) OSA- attempted to arrange home CPAP, but per chart review pt needs a new sleep study first since he has medicare and did not tolerate CPAP last time. He is agreeable to try nasal pillows.     Review of Systems See HPI  Past Medical History  Diagnosis Date  . Nephrolithiasis   . Arthritis     rheumatoid  . Colon polyp 2000  . Atrial fibrillation   . Unspecified essential hypertension   . Gout     History   Social History  . Marital Status: Single    Spouse Name: N/A  . Number of Children: 3  . Years of Education: N/A   Occupational History  .     Social History Main Topics  . Smoking status: Never Smoker   . Smokeless tobacco: Never Used  . Alcohol Use: Yes     Comment: occasional  . Drug Use: No  . Sexual Activity: Not on file   Other Topics Concern  . 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 Spine surgery      x 2  . Joint replacement      Total L-Hip replacement, Right Knee 10/20/09  . Cholecystectomy  1994  . Left and right heart catheterization with coronary angiogram N/A 02/22/2013    Procedure: LEFT AND RIGHT HEART  CATHETERIZATION WITH CORONARY ANGIOGRAM;  Surgeon: Jolaine Artist, MD;  Location: [facility] CATH LAB;  Service: Cardiovascular;  Laterality: N/A;    Family History  Problem Relation Age of Onset  . Hypertension Mother   . Arthritis Mother     ?RA  . Hypertension Father     Allergies  Allergen Reactions  . Ace Inhibitors     Worsening renal insufficiency    Current Outpatient Prescriptions on File Prior to Visit  Medication Sig Dispense Refill  . allopurinol (ZYLOPRIM) 100 MG tablet TAKE 2 TABLETS BY MOUTH DAILY 60 tablet 3  . amLODipine (NORVASC) 10 MG tablet TAKE 1 TABLET BY MOUTH EVERY DAY 30 tablet 2  . carvedilol (COREG) 12.5 MG tablet TAKE 1 TABLET BY MOUTH EVERY MORNING AND 1/2 TABLET BY MOUTH EVERY EVENING 45 tablet 0  . colchicine 0.6 MG tablet TAKE 2 TABLETS BY MOUTH AT START OF GOUT FLARE AND TAKE 1 TABLET BY MOUTH 3 HOURS LATER. MAX OF 3 TABLETS PER GOUT ATTACK. 30 tablet 0  . furosemide (LASIX) 40 MG tablet TAKE 2 TABLETS BY MOUTH TWICE DAILY 120 tablet 2  . hydrALAZINE (APRESOLINE) 50 MG tablet TAKE 1 TABLET BY MOUTH THREE TIMES DAILY 90 tablet 2  .  isosorbide mononitrate (IMDUR) 30 MG 24 hr tablet Take 1 tablet (30 mg total) by mouth daily. 30 tablet 6  . metolazone (ZAROXOLYN) 2.5 MG tablet Take one daily 30 min prior to your morning fluid pill dose 2 days a week. 10 tablet 11  . Multiple Vitamins-Minerals (PX MENS MULTIVITAMINS) TABS Take 1 tablet by mouth daily.    . ondansetron (ZOFRAN) 4 MG tablet TAKE 1 TABLET BY MOUTH EVERY 6 HOURS AS NEEDED FOR NAUSEA 20 tablet 0  . potassium chloride SA (K-DUR,KLOR-CON) 20 MEQ tablet TAKE 1 TABLET BY MOUTH THREE TIMES DAILY 90 tablet 6  . sildenafil (VIAGRA) 100 MG tablet Take 100 mg by mouth daily as needed for erectile dysfunction.     [name] spironolactone (ALDACTONE) 25 MG tablet TAKE 1 TABLET BY MOUTH EVERY DAY 30 tablet 6  . terazosin (HYTRIN) 1 MG capsule TAKE 1 CAPSULE BY MOUTH AT BEDTIME 30 capsule 2  . valACYclovir (VALTREX)  500 MG tablet Take 1 tablet (500 mg total) by mouth daily. 30 tablet 5  . warfarin (COUMADIN) 7.5 MG tablet TAKE 1 TABLET AS DIRECTED BY COUMADIN CLINIC 40 tablet 3  . amoxicillin (AMOXIL) 500 MG capsule As needed for upcoming dental procedure     No current facility-administered medications on file prior to visit.    BP 126/72 mmHg  Pulse 45  Temp(Src) 98.2 F (36.8 C) (Oral)  Resp 16  Ht 6' (1.829 m)  Wt 302 lb 9.6 oz (137.258 kg)  BMI 41.03 kg/m2  SpO2 98%       Objective:   Physical Exam  Constitutional: He is oriented to person, place, and time. He appears well-developed and well-nourished. No distress.  HENT:  Head: Normocephalic and atraumatic.  Bilateral TM's occluded by cerumen  Cardiovascular: Normal rate and regular rhythm.   No murmur heard. Pulmonary/Chest: Effort normal and breath sounds normal. No respiratory distress. He has no wheezes. He has no rales.  Musculoskeletal:  Trace LLE edema, 1+ RLE edema  Neurological: He is alert and oriented to person, place, and time.  Skin: Skin is warm and dry.  Psychiatric: He has a normal mood and affect. His behavior is normal. Thought content normal.          Assessment & Plan:

## 2014-09-15 NOTE — Assessment & Plan Note (Signed)
euvolemic today.  Management per cardiology.

## 2014-09-16 ENCOUNTER — Telehealth: Payer: Self-pay | Admitting: Family

## 2014-09-16 NOTE — Telephone Encounter (Signed)
emmi emailed °

## 2014-09-26 DIAGNOSIS — M1A00X0 Idiopathic chronic gout, unspecified site, without tophus (tophi): Secondary | ICD-10-CM | POA: Diagnosis not present

## 2014-09-26 DIAGNOSIS — M25551 Pain in right hip: Secondary | ICD-10-CM | POA: Diagnosis not present

## 2014-09-26 DIAGNOSIS — M0579 Rheumatoid arthritis with rheumatoid factor of multiple sites without organ or systems involvement: Secondary | ICD-10-CM | POA: Diagnosis not present

## 2014-09-26 DIAGNOSIS — M79641 Pain in right hand: Secondary | ICD-10-CM | POA: Diagnosis not present

## 2014-10-03 ENCOUNTER — Ambulatory Visit (INDEPENDENT_AMBULATORY_CARE_PROVIDER_SITE_OTHER): Payer: Medicare Other | Admitting: *Deleted

## 2014-10-03 DIAGNOSIS — Z5181 Encounter for therapeutic drug level monitoring: Secondary | ICD-10-CM

## 2014-10-03 DIAGNOSIS — Z7901 Long term (current) use of anticoagulants: Secondary | ICD-10-CM | POA: Diagnosis not present

## 2014-10-03 DIAGNOSIS — I4891 Unspecified atrial fibrillation: Secondary | ICD-10-CM | POA: Diagnosis not present

## 2014-10-03 LAB — POCT INR: INR: 2

## 2014-10-15 ENCOUNTER — Other Ambulatory Visit: Payer: Self-pay | Admitting: Family

## 2014-10-27 DIAGNOSIS — L603 Nail dystrophy: Secondary | ICD-10-CM | POA: Diagnosis not present

## 2014-10-27 DIAGNOSIS — I739 Peripheral vascular disease, unspecified: Secondary | ICD-10-CM | POA: Diagnosis not present

## 2014-10-31 ENCOUNTER — Telehealth: Payer: Self-pay | Admitting: Family

## 2014-10-31 NOTE — Telephone Encounter (Signed)
Recommend claritin 10mg  once daily (not claritin D), delsym prn cough. Follow up in office if fever, if symptoms worsen or if symptoms do not improve.

## 2014-10-31 NOTE — Telephone Encounter (Signed)
Caller name: Gabe Relation to pt: self Call back number: [phone] Pharmacy: [facility] on [address]  Reason for call:   Patient states that he is coughing at night and wants to know what OTC medication [name] recommends he take?

## 2014-10-31 NOTE — Telephone Encounter (Signed)
Notified pt and he voices understanding. 

## 2014-10-31 NOTE — Telephone Encounter (Signed)
Pt reports that he has had nasal congestion, sinus drainage and now cough that is intermittently productive and clear in color x 4 days. Has not tried otc meds yet and wants to know what you might recommend OTC?

## 2014-11-10 ENCOUNTER — Other Ambulatory Visit: Payer: Self-pay | Admitting: Family

## 2014-11-10 NOTE — Telephone Encounter (Signed)
Refill sent per [facility] refill protocol/SLS  

## 2014-11-14 ENCOUNTER — Ambulatory Visit (INDEPENDENT_AMBULATORY_CARE_PROVIDER_SITE_OTHER): Payer: Medicare Other | Admitting: *Deleted

## 2014-11-14 DIAGNOSIS — Z7901 Long term (current) use of anticoagulants: Secondary | ICD-10-CM

## 2014-11-14 DIAGNOSIS — Z5181 Encounter for therapeutic drug level monitoring: Secondary | ICD-10-CM | POA: Diagnosis not present

## 2014-11-14 DIAGNOSIS — I4891 Unspecified atrial fibrillation: Secondary | ICD-10-CM

## 2014-11-14 LAB — POCT INR: INR: 3.5

## 2014-11-15 ENCOUNTER — Other Ambulatory Visit: Payer: Self-pay | Admitting: Family

## 2014-11-23 ENCOUNTER — Ambulatory Visit (HOSPITAL_BASED_OUTPATIENT_CLINIC_OR_DEPARTMENT_OTHER): Payer: Medicare Other | Attending: Family

## 2014-11-23 ENCOUNTER — Other Ambulatory Visit (HOSPITAL_COMMUNITY): Payer: Self-pay | Admitting: Internal Medicine

## 2014-11-23 VITALS — Ht 74.0 in | Wt 275.0 lb

## 2014-11-23 DIAGNOSIS — G4733 Obstructive sleep apnea (adult) (pediatric): Secondary | ICD-10-CM | POA: Insufficient documentation

## 2014-11-23 DIAGNOSIS — I472 Ventricular tachycardia: Secondary | ICD-10-CM | POA: Insufficient documentation

## 2014-11-23 DIAGNOSIS — R5383 Other fatigue: Secondary | ICD-10-CM | POA: Diagnosis present

## 2014-12-01 ENCOUNTER — Ambulatory Visit (INDEPENDENT_AMBULATORY_CARE_PROVIDER_SITE_OTHER): Payer: Medicare Other | Admitting: *Deleted

## 2014-12-01 DIAGNOSIS — Z5181 Encounter for therapeutic drug level monitoring: Secondary | ICD-10-CM | POA: Diagnosis not present

## 2014-12-01 DIAGNOSIS — Z7901 Long term (current) use of anticoagulants: Secondary | ICD-10-CM

## 2014-12-01 DIAGNOSIS — I4891 Unspecified atrial fibrillation: Secondary | ICD-10-CM | POA: Diagnosis not present

## 2014-12-01 LAB — POCT INR: INR: 3.2

## 2014-12-03 DIAGNOSIS — I10 Essential (primary) hypertension: Secondary | ICD-10-CM | POA: Diagnosis not present

## 2014-12-03 DIAGNOSIS — N183 Chronic kidney disease, stage 3 (moderate): Secondary | ICD-10-CM | POA: Diagnosis not present

## 2014-12-10 ENCOUNTER — Emergency Department (HOSPITAL_BASED_OUTPATIENT_CLINIC_OR_DEPARTMENT_OTHER): Payer: Medicare Other

## 2014-12-10 ENCOUNTER — Emergency Department (HOSPITAL_BASED_OUTPATIENT_CLINIC_OR_DEPARTMENT_OTHER)
Admission: EM | Admit: 2014-12-10 | Discharge: 2014-12-10 | Disposition: A | Discharge status: Home / Self Care | Payer: Medicare Other | Attending: Emergency Medicine | Admitting: Emergency Medicine

## 2014-12-10 ENCOUNTER — Telehealth: Payer: Self-pay | Admitting: Family

## 2014-12-10 ENCOUNTER — Encounter (HOSPITAL_BASED_OUTPATIENT_CLINIC_OR_DEPARTMENT_OTHER): Payer: Self-pay | Admitting: *Deleted

## 2014-12-10 DIAGNOSIS — N289 Disorder of kidney and ureter, unspecified: Secondary | ICD-10-CM | POA: Insufficient documentation

## 2014-12-10 DIAGNOSIS — Z9889 Other specified postprocedural states: Secondary | ICD-10-CM | POA: Diagnosis not present

## 2014-12-10 DIAGNOSIS — Z7901 Long term (current) use of anticoagulants: Secondary | ICD-10-CM | POA: Insufficient documentation

## 2014-12-10 DIAGNOSIS — Z79899 Other long term (current) drug therapy: Secondary | ICD-10-CM | POA: Diagnosis not present

## 2014-12-10 DIAGNOSIS — Z87442 Personal history of urinary calculi: Secondary | ICD-10-CM | POA: Insufficient documentation

## 2014-12-10 DIAGNOSIS — Z86010 Personal history of colonic polyps: Secondary | ICD-10-CM | POA: Insufficient documentation

## 2014-12-10 DIAGNOSIS — R197 Diarrhea, unspecified: Secondary | ICD-10-CM

## 2014-12-10 DIAGNOSIS — I10 Essential (primary) hypertension: Secondary | ICD-10-CM | POA: Diagnosis not present

## 2014-12-10 DIAGNOSIS — Z7951 Long term (current) use of inhaled steroids: Secondary | ICD-10-CM | POA: Insufficient documentation

## 2014-12-10 DIAGNOSIS — I4891 Unspecified atrial fibrillation: Secondary | ICD-10-CM | POA: Insufficient documentation

## 2014-12-10 DIAGNOSIS — R0602 Shortness of breath: Secondary | ICD-10-CM | POA: Diagnosis not present

## 2014-12-10 DIAGNOSIS — R0600 Dyspnea, unspecified: Secondary | ICD-10-CM | POA: Diagnosis not present

## 2014-12-10 DIAGNOSIS — M109 Gout, unspecified: Secondary | ICD-10-CM | POA: Diagnosis not present

## 2014-12-10 DIAGNOSIS — G4733 Obstructive sleep apnea (adult) (pediatric): Secondary | ICD-10-CM

## 2014-12-10 LAB — LIPASE, BLOOD: Lipase: 53 U/L — ABNORMAL HIGH (ref 22–51)

## 2014-12-10 LAB — COMPREHENSIVE METABOLIC PANEL
ALK PHOS: 129 U/L — AB (ref 38–126)
ALT: 22 U/L (ref 17–63)
ANION GAP: 6 (ref 5–15)
AST: 31 U/L (ref 15–41)
Albumin: 4.3 g/dL (ref 3.5–5.0)
BUN: 50 mg/dL — ABNORMAL HIGH (ref 6–20)
CHLORIDE: 101 mmol/L (ref 101–111)
CO2: 29 mmol/L (ref 22–32)
CREATININE: 1.85 mg/dL — AB (ref 0.61–1.24)
Calcium: 9.3 mg/dL (ref 8.9–10.3)
GFR calc Af Amer: 41 mL/min — ABNORMAL LOW (ref >60)
GFR calc non Af Amer: 35 mL/min — ABNORMAL LOW (ref >60)
Glucose, Bld: 108 mg/dL — ABNORMAL HIGH (ref 65–99)
POTASSIUM: 3.9 mmol/L (ref 3.5–5.1)
SODIUM: 136 mmol/L (ref 135–145)
TOTAL PROTEIN: 8.4 g/dL — AB (ref 6.5–8.1)
Total Bilirubin: 0.9 mg/dL (ref 0.3–1.2)

## 2014-12-10 LAB — CBC WITH DIFFERENTIAL/PLATELET
Basophils Absolute: 0 10*3/uL (ref 0.0–0.1)
Basophils Relative: 0 % (ref 0–1)
EOS PCT: 2 % (ref 0–5)
Eosinophils Absolute: 0.2 10*3/uL (ref 0.0–0.7)
HCT: 34.7 % — ABNORMAL LOW (ref 39.0–52.0)
Hemoglobin: 11.4 g/dL — ABNORMAL LOW (ref 13.0–17.0)
LYMPHS ABS: 1 10*3/uL (ref 0.7–4.0)
LYMPHS PCT: 16 % (ref 12–46)
MCH: 30.4 pg (ref 26.0–34.0)
MCHC: 32.9 g/dL (ref 30.0–36.0)
MCV: 92.5 fL (ref 78.0–100.0)
MONOS PCT: 9 % (ref 3–12)
Monocytes Absolute: 0.6 10*3/uL (ref 0.1–1.0)
NEUTROS ABS: 4.6 10*3/uL (ref 1.7–7.7)
Neutrophils Relative %: 73 % (ref 43–77)
PLATELETS: 144 10*3/uL — AB (ref 150–400)
RBC: 3.75 MIL/uL — ABNORMAL LOW (ref 4.22–5.81)
RDW: 16.8 % — ABNORMAL HIGH (ref 11.5–15.5)
WBC: 6.3 10*3/uL (ref 4.0–10.5)

## 2014-12-10 LAB — BRAIN NATRIURETIC PEPTIDE: B NATRIURETIC PEPTIDE 5: 116.9 pg/mL — AB (ref 0.0–100.0)

## 2014-12-10 LAB — PROTIME-INR
INR: 2.28 — ABNORMAL HIGH (ref 0.00–1.49)
PROTHROMBIN TIME: 24.9 s — AB (ref 11.6–15.2)

## 2014-12-10 LAB — TROPONIN I: Troponin I: 0.04 ng/mL — ABNORMAL HIGH (ref <0.031)

## 2014-12-10 NOTE — ED Provider Notes (Signed)
CSN: [id number]     Arrival date & time 12/10/14  1728 History   First MD Initiated Contact with Patient 12/10/14 1746     Chief Complaint  Patient presents with  . Shortness of Breath    Patient is a 74 y.o. male presenting with shortness of breath. The history is provided by the patient.  Shortness of Breath Severity:  Moderate Onset quality:  Gradual Timing:  Intermittent Progression:  Worsening Chronicity:  New Relieved by:  Rest Worsened by:  Activity Associated symptoms: abdominal pain   Associated symptoms: no chest pain, no fever, no syncope and no vomiting   pt reports for past day he has had nausea (no vomiting) as well as SOB No CP He reports some mild nonbloody diarrhea He also has cough He also reports dyspnea on exertion He reports mild abdominal pain He also reports 10lb wt gain recently  Past Medical History  Diagnosis Date  . Nephrolithiasis   . Arthritis     rheumatoid  . Colon polyp 2000  . Atrial fibrillation   . Unspecified essential hypertension   . Gout    Past Surgical History  Procedure Laterality Date  . Spine surgery      x 2  . Joint replacement      Total L-Hip replacement, Right Knee 10/20/09  . Cholecystectomy  1994  . Left and right heart catheterization with coronary angiogram N/A 02/22/2013    Procedure: LEFT AND RIGHT HEART CATHETERIZATION WITH CORONARY ANGIOGRAM;  Surgeon: Jolaine Artist, MD;  Location: [facility] CATH LAB;  Service: Cardiovascular;  Laterality: N/A;   Family History  Problem Relation Age of Onset  . Hypertension Mother   . Arthritis Mother     ?RA  . Hypertension Father    History  Substance Use Topics  . Smoking status: Never Smoker   . Smokeless tobacco: Never Used  . Alcohol Use: Yes     Comment: occasional    Review of Systems  Constitutional: Negative for fever.  Respiratory: Positive for shortness of breath.   Cardiovascular: Negative for chest pain and syncope.  Gastrointestinal: Positive for  abdominal pain. Negative for vomiting.  All other systems reviewed and are negative.     Allergies  Ace inhibitors  Home Medications   Prior to Admission medications   Medication Sig Start Date End Date Taking? Authorizing Provider  sildenafil (VIAGRA) 100 MG tablet Take 100 mg by mouth daily as needed for erectile dysfunction.    Yes Historical Provider, MD  allopurinol (ZYLOPRIM) 100 MG tablet TAKE 2 TABLETS BY MOUTH EVERY DAY 11/10/14   Debbrah Alar, NP  amLODipine (NORVASC) 10 MG tablet TAKE 1 TABLET BY MOUTH EVERY DAY 09/15/14   Debbrah Alar, NP  carvedilol (COREG) 12.5 MG tablet TAKE 1 TABLET BY MOUTH EVERY MORNING AND 1/2 TABLET BY MOUTH EVERY EVENING 09/05/14   Jolaine Artist, MD  colchicine 0.6 MG tablet TAKE 2 TABLETS BY MOUTH AT START OF GOUT FLARE AND TAKE 1 TABLET BY MOUTH 3 HOURS LATER. MAX OF 3 TABLETS PER GOUT ATTACK. 08/15/14   Debbrah Alar, NP  fluticasone (FLONASE) 50 MCG/ACT nasal spray Place 2 sprays into both nostrils daily. 09/15/14   Debbrah Alar, NP  furosemide (LASIX) 40 MG tablet TAKE 2 TABLET BY MOUTH TWICE DAILY 11/18/14   Debbrah Alar, NP  hydrALAZINE (APRESOLINE) 50 MG tablet TAKE 1 TABLET BY MOUTH THREE TIMES DAILY 09/11/14   Debbrah Alar, NP  hydroxychloroquine (PLAQUENIL) 200 MG tablet Take 200 mg  by mouth 2 (two) times daily. 09/11/14   Bo Merino, MD  isosorbide mononitrate (IMDUR) 30 MG 24 hr tablet Take 1 tablet (30 mg total) by mouth daily. 07/14/14   Amy D Clegg, NP  metolazone (ZAROXOLYN) 2.5 MG tablet TAKE 1 TABLET BY MOUTH DAILY 30 MINUTES PRIOR TO YOUR MORNING FLUID PILL DOSE 2 DAYS PER WEEK. 11/24/14   Jolaine Artist, MD  Multiple Vitamins-Minerals (PX MENS MULTIVITAMINS) TABS Take 1 tablet by mouth daily.    Historical Provider, MD  ondansetron (ZOFRAN) 4 MG tablet TAKE 1 TABLET BY MOUTH EVERY 6 HOURS AS NEEDED FOR NAUSEA 06/27/14   Debbrah Alar, NP  potassium chloride SA (K-DUR,KLOR-CON) 20 MEQ  tablet TAKE 1 TABLET BY MOUTH THREE TIMES DAILY 06/04/14   Larey Dresser, MD  spironolactone (ALDACTONE) 25 MG tablet TAKE 1 TABLET BY MOUTH EVERY DAY 06/06/14   Jolaine Artist, MD  terazosin (HYTRIN) 1 MG capsule TAKE 1 CAPSULE BY MOUTH EVERY NIGHT AT BEDTIME 10/15/14   Debbrah Alar, NP  traMADol (ULTRAM) 50 MG tablet Take 50 mg by mouth every 6 (six) hours as needed.    Historical Provider, MD  valACYclovir (VALTREX) 500 MG tablet Take 1 tablet (500 mg total) by mouth daily. 11/18/13   Debbrah Alar, NP  warfarin (COUMADIN) 7.5 MG tablet TAKE 1 TABLET AS DIRECTED BY COUMADIN CLINIC 07/08/14   Evans Lance, MD   BP 144/76 mmHg  Pulse 45  Temp(Src) 97.6 F (36.4 C)  Resp 16  Ht 6\' 3"  (1.905 m)  Wt 290 lb (131.543 kg)  BMI 36.25 kg/m2  SpO2 97% Physical Exam CONSTITUTIONAL: Well developed/well nourished HEAD: Normocephalic/atraumatic EYES: EOMI/PERRL ENMT: Mucous membranes moist NECK: supple no meningeal signs SPINE/BACK:entire spine nontender CV: S1/S2 noted, no murmurs/rubs/gallops noted, bradycardic LUNGS: Lungs are clear to auscultation bilaterally, no apparent distress ABDOMEN: soft, nontender, no rebound or guarding, bowel sounds noted throughout abdomen GU:no cva tenderness NEURO: Pt is awake/alert/appropriate, moves all extremitiesx4.  No facial droop.   EXTREMITIES: pulses normal/equal, full ROM, symmetric edema noted bilateral LE SKIN: warm, color normal PSYCH: no abnormalities of mood noted, alert and oriented to situation  ED Course  Procedures  7:02 PM Pt with SOB for past day He is bradycardic but EKG unchanged from prior Workup pending at this time 8:39 PM Labs mostly at baseline  (suspect elevated trop due to CHF/renal insufficiency) Apparently he is persistently bradycardic with minimal change from prior EKG He ambulated without difficulty No hypoxia noted CXR shows cardiomegaly (seen in prior) He has h/o nonischemic cardiomyopathy He  denies CP - I doubt ACS/PE at this time He prefers to go home and f/u with cardiology  Labs Review Labs Reviewed  CBC WITH DIFFERENTIAL/PLATELET - Abnormal; Notable for the following:    RBC 3.75 (*)    Hemoglobin 11.4 (*)    HCT 34.7 (*)    RDW 16.8 (*)    Platelets 144 (*)    All other components within normal limits  COMPREHENSIVE METABOLIC PANEL - Abnormal; Notable for the following:    Glucose, Bld 108 (*)    BUN 50 (*)    Creatinine, Ser 1.85 (*)    Total Protein 8.4 (*)    Alkaline Phosphatase 129 (*)    GFR calc non Af Amer 35 (*)    GFR calc Af Amer 41 (*)    All other components within normal limits  LIPASE, BLOOD - Abnormal; Notable for the following:    Lipase 53 (*)  All other components within normal limits  TROPONIN I - Abnormal; Notable for the following:    Troponin I 0.04 (*)    All other components within normal limits  BRAIN NATRIURETIC PEPTIDE - Abnormal; Notable for the following:    B Natriuretic Peptide 116.9 (*)    All other components within normal limits  PROTIME-INR - Abnormal; Notable for the following:    Prothrombin Time 24.9 (*)    INR 2.28 (*)    All other components within normal limits    Imaging Review Dg Chest Portable 1 View  12/10/2014   CLINICAL DATA:  Shortness of breath for 1 day. History of atrial fibrillation  EXAM: PORTABLE CHEST - 1 VIEW  COMPARISON:  January 29, 2013  FINDINGS: There is generalized cardiomegaly. Pulmonary vascular is normal. No adenopathy. Lungs are clear.  IMPRESSION: Lungs clear.  Generalized cardiac enlargement.   Electronically Signed   By: Lowella Grip III M.D.   On: 12/10/2014 18:48     EKG Interpretation   Date/Time:  Wednesday Dec 10 2014 18:15:55 EDT Ventricular Rate:  44 PR Interval:    QRS Duration: 86 QT Interval:  500 QTC Calculation: 427 R Axis:   -62 Text Interpretation:  Junctional bradycardia Left axis deviation Low  voltage QRS Inferior infarct , age undetermined Cannot rule out  Anterior  infarct , age undetermined Abnormal ECG No significant change since last  tracing Confirmed by Christy Gentles  MD, [place] ([id number]) on 12/10/2014 6:19:12 PM      MDM   Final diagnoses:  Dyspnea  Diarrhea  Renal insufficiency    Nursing notes including past medical history and social history reviewed and considered in documentation Labs/vital reviewed myself and considered during evaluation xrays/imaging reviewed by myself and considered during evaluation     [name] Fraise, MD 12/10/14 2043

## 2014-12-10 NOTE — Sleep Study (Signed)
[place]   NAME: Bruce Cole  DATE OF BIRTH: 1941/03/13  MEDICAL RECORD [id number]  LOCATION: [facility]   PHYSICIAN: Nickol Collister V.   DATE OF STUDY: 11/23/14   SLEEP STUDY TYPE: Nocturnal Polysomnogram   REFERRING PHYSICIAN: Rigoberto Noel, MD   INDICATION FOR STUDY:  74 year old with diastolic heart failure and chronic atrial fibrillation presents with non-refreshing sleep and excessive daytime fatigue. Polysomnogram in 03/2013 showed moderate OSA with AHI of 28 events per hour and lowest desaturation of 77%. His weight then was 275 pounds At the time of this study ,they weighed 275 pounds with a height of 6 ft 2 inches and the BMI of 35, neck size of 19 inches. Epworth sleepiness score was 7   This nocturnal polysomnogram was performed with a sleep technologist in attendance. EEG, EOG,EMG and respiratory parameters recorded. Sleep stages, arousals, limb movements and respiratory data was scored according to criteria laid out by the American Academy of sleep medicine.   SLEEP ARCHITECTURE: Lights out was at 2316 PM and lights on was at 541 AM. Total sleep time was 259 minutes with a sleep period time of 372 minutes and a sleep efficiency of 67 %. Sleep latency was 12 minutes with latency to REM sleep of 164 minutes and wake after sleep onset of 113 minutes. . Sleep stages as a percentage of total sleep time was N1 -16 %,N2- 77.6 % and REM sleep 6 % ( 15 minutes) . The longest period of REM sleep was around 2 AM.   AROUSAL DATA : There were 58  arousals with an arousal index of 13 events per hour. Most of these were spontaneous & for were associated with respiratory events  RESPIRATORY DATA: There were 1 obstructive apneas, 0 central apneas, 0 mixed apneas and 20 hypopneas with apnea -hypopnea index of 5 events per hour. There were 9 RERAs with an RDI of 7  events per hour. There was no relation to sleep stage or body position. Supine  sleep was not noted  MOVEMENT/PARASOMNIA: There were 459 PLMS with a PLM index of 106 events per hour. The PLM arousal index was 3.5 per hour.  OXYGEN DATA: The lowest desaturation was 77 % during REM sleep and the desaturation index was 6 per hour.   CARDIAC DATA: The low heart rate was 35 beats per minute. The high heart rate recorded was an artifact. One to 2 short runs of nonsustained VT were noted  DISCUSSION -Loud snoring was noted . He did not meet criteria for CPAP intervention. He was desensitized with a small nasal mask. Note that with the same body weight, the study in 2014 showed moderate OSA. This difference may be due to lack of supine sleep on the study  IMPRESSION :  1. Mild obstructive sleep apnea with hypopneas causing sleep fragmentation and moderate oxygen desaturation.  2. Short runs of nonsustained VT were noted. His beta blocker may need to be titrated 3. Significant PLM's were noted, the PLM arousal index was low. Please correlate with clinical history of restless leg syndrome.  4. Sleep efficiency was poor.  RECOMMENDATION:  1. Treatment options for this degree of sleep disordered breathing include weight loss and positional therapy to avoid supine sleep 2. Consider titrating beta blocker further, defer to cardiologist  3. Patient should be cautioned against driving when sleepy.They should be asked to avoid medications with sedative side effects    Rigoberto Noel MD Diplomate, American Board of  Sleep Medicine    ELECTRONICALLY SIGNED ON: 12/10/2014  [facility]  PH: [phone] FX: [phone]  [place]

## 2014-12-10 NOTE — Telephone Encounter (Signed)
Please contact pt and let him know that sleep study noted mild OSA. He does not needs cpap for this.  He should avoid laying flat on his back and work on weight loss.  There were some episodes of fast heart rate and I am going to forward to his cardiologist to see if they would like to adjust his meds.

## 2014-12-10 NOTE — ED Notes (Signed)
Ambulated patient in the hallway with pulse Ox. Patient O2 stayed between 96-100%

## 2014-12-10 NOTE — ED Notes (Signed)
Pt c/o SOB with nausea and diarrhea x 1 d ay

## 2014-12-10 NOTE — Telephone Encounter (Signed)
Notified pt and he voices understanding. 

## 2014-12-11 DIAGNOSIS — G4733 Obstructive sleep apnea (adult) (pediatric): Secondary | ICD-10-CM | POA: Diagnosis not present

## 2014-12-12 ENCOUNTER — Ambulatory Visit (INDEPENDENT_AMBULATORY_CARE_PROVIDER_SITE_OTHER): Payer: Medicare Other | Admitting: *Deleted

## 2014-12-12 DIAGNOSIS — I4891 Unspecified atrial fibrillation: Secondary | ICD-10-CM | POA: Diagnosis not present

## 2014-12-12 DIAGNOSIS — Z5181 Encounter for therapeutic drug level monitoring: Secondary | ICD-10-CM

## 2014-12-12 DIAGNOSIS — Z7901 Long term (current) use of anticoagulants: Secondary | ICD-10-CM | POA: Diagnosis not present

## 2014-12-12 LAB — POCT INR: INR: 2.7

## 2014-12-13 ENCOUNTER — Other Ambulatory Visit: Payer: Self-pay | Admitting: Family

## 2014-12-16 ENCOUNTER — Other Ambulatory Visit: Payer: Self-pay | Admitting: Family

## 2014-12-16 ENCOUNTER — Encounter: Payer: Self-pay | Admitting: Family

## 2014-12-16 ENCOUNTER — Ambulatory Visit (INDEPENDENT_AMBULATORY_CARE_PROVIDER_SITE_OTHER): Payer: Medicare Other | Admitting: Family

## 2014-12-16 VITALS — BP 124/60 | HR 50 | Temp 98.1°F | Resp 16 | Ht 72.0 in | Wt 286.8 lb

## 2014-12-16 DIAGNOSIS — E119 Type 2 diabetes mellitus without complications: Secondary | ICD-10-CM

## 2014-12-16 DIAGNOSIS — D649 Anemia, unspecified: Secondary | ICD-10-CM | POA: Insufficient documentation

## 2014-12-16 DIAGNOSIS — G4733 Obstructive sleep apnea (adult) (pediatric): Secondary | ICD-10-CM

## 2014-12-16 DIAGNOSIS — E118 Type 2 diabetes mellitus with unspecified complications: Secondary | ICD-10-CM | POA: Diagnosis not present

## 2014-12-16 DIAGNOSIS — I10 Essential (primary) hypertension: Secondary | ICD-10-CM

## 2014-12-16 LAB — IRON: Iron: 53 ug/dL (ref 42–165)

## 2014-12-16 LAB — HEMOGLOBIN A1C: Hgb A1c MFr Bld: 6.4 % (ref 4.6–6.5)

## 2014-12-16 LAB — MICROALBUMIN / CREATININE URINE RATIO
Creatinine,U: 44.7 mg/dL
Microalb Creat Ratio: 46.3 mg/g — ABNORMAL HIGH (ref 0.0–30.0)
Microalb, Ur: 20.7 mg/dL — ABNORMAL HIGH (ref 0.0–1.9)

## 2014-12-16 NOTE — Assessment & Plan Note (Signed)
New, mild.  ? Secondary to CRI versus other. Will obtain serum iron and have pt complete IFOB.

## 2014-12-16 NOTE — Patient Instructions (Signed)
Please complete lab work prior to leaving. Complete IFOB kit and return at your earliest convenience. Please schedule a follow up appointment in 3 months.

## 2014-12-16 NOTE — Assessment & Plan Note (Signed)
Mild, discussed avoiding back sleeping and weight loss which he is working on.

## 2014-12-16 NOTE — Progress Notes (Signed)
Pre visit review using our clinic review tool, if applicable. No additional management support is needed unless otherwise documented below in the visit note. 

## 2014-12-16 NOTE — Assessment & Plan Note (Signed)
BP stable on current meds. Continue same.  

## 2014-12-16 NOTE — Progress Notes (Signed)
Subjective:    Patient ID: Bruce Cole, male    DOB: August 26, 1940, 74 y.o.   MRN: [name]  HPI   Patient presents today for follow up of multiple medical problems.  Diabetes Type 2  Pt is currently maintained on the following medications for diabetes:none, diet Wt Readings from Last 3 Encounters:  12/16/14 286 lb 12.8 oz (130.092 kg)  12/10/14 290 lb (131.543 kg)  12/04/14 275 lb (124.739 kg)     Lab Results  Component Value Date   HGBA1C 6.7* 06/23/2014   HGBA1C 6.7* 03/17/2014   HGBA1C 6.4* 12/04/2013    Lab Results  Component Value Date   MICROALBUR 9.6* 03/17/2014   LDLCALC 83 03/17/2014   CREATININE 1.85* 12/10/2014    Last diabetic eye exam was 1/16 Denies polyuria/polydipsia. Denies hypoglycemia Home glucose readings range- not checking  Hypertension  Patient is currently maintained on the following medications for blood pressure: amlodipine, coreg, hydralazine, imdur, aldactone, hytrin. Patient reports good compliance with blood pressure medications. Patient denies chest pain, shortness of breath or swelling. Last 3 blood pressure readings in our office are as follows: BP Readings from Last 3 Encounters:  12/10/14 131/82  09/15/14 126/72  06/16/14 136/65   Renal Insufficiency-  Follows with Dr. [name]. Last visit was 2 weeks ago. Reports that Dr. [name] changed furosemide form 80mg  BID to 80mg  AM 40mg  PM Lab Results  Component Value Date   CREATININE 1.85* 12/10/2014   OSA- sleep study noted mild osa only.  There were some non-sustained VT episodes noted and his report was forwarded to his cardiologist for review.   Of note, he was seen in the ED on 5/25- reports that he had URI.  Now resolved.   Review of Systems See HPI  Past Medical History  Diagnosis Date  . Nephrolithiasis   . Arthritis     rheumatoid  . Colon polyp 2000  . Atrial fibrillation   . Unspecified essential hypertension   . Gout     History   Social History  .  Marital Status: Single    Spouse Name: N/A  . Number of Children: 3  . Years of Education: N/A   Occupational History  .     Social History Main Topics  . Smoking status: Never Smoker   . Smokeless tobacco: Never Used  . Alcohol Use: Yes     Comment: occasional  . Drug Use: No  . Sexual Activity: Not on file   Other Topics Concern  . 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 Spine surgery      x 2  . Joint replacement      Total L-Hip replacement, Right Knee 10/20/09  . Cholecystectomy  1994  . Left and right heart catheterization with coronary angiogram N/A 02/22/2013    Procedure: LEFT AND RIGHT HEART CATHETERIZATION WITH CORONARY ANGIOGRAM;  Surgeon: Jolaine Artist, MD;  Location: [facility] CATH LAB;  Service: Cardiovascular;  Laterality: N/A;    Family History  Problem Relation Age of Onset  . Hypertension Mother   . Arthritis Mother     ?RA  . Hypertension Father     Allergies  Allergen Reactions  . Ace Inhibitors     Worsening renal insufficiency    Current Outpatient Prescriptions on File Prior to Visit  Medication Sig Dispense Refill  . allopurinol (ZYLOPRIM) 100 MG  tablet TAKE 2 TABLETS BY MOUTH EVERY DAY 60 tablet 2  . amLODipine (NORVASC) 10 MG tablet TAKE 1 TABLET BY MOUTH EVERY DAY 30 tablet 2  . carvedilol (COREG) 12.5 MG tablet TAKE 1 TABLET BY MOUTH EVERY MORNING AND 1/2 TABLET BY MOUTH EVERY EVENING 45 tablet 0  . colchicine 0.6 MG tablet TAKE 2 TABLETS BY MOUTH AT START OF GOUT FLARE AND TAKE 1 TABLET BY MOUTH 3 HOURS LATER. MAX OF 3 TABLETS PER GOUT ATTACK. 30 tablet 0  . fluticasone (FLONASE) 50 MCG/ACT nasal spray Place 2 sprays into both nostrils daily. 16 g 6  . furosemide (LASIX) 40 MG tablet TAKE 2 TABLET BY MOUTH TWICE DAILY 120 tablet 5  . hydrALAZINE (APRESOLINE) 50 MG tablet TAKE 1 TABLET BY MOUTH THREE TIMES DAILY 90 tablet 5  .  hydroxychloroquine (PLAQUENIL) 200 MG tablet Take 200 mg by mouth 2 (two) times daily.    . isosorbide mononitrate (IMDUR) 30 MG 24 hr tablet Take 1 tablet (30 mg total) by mouth daily. 30 tablet 6  . metolazone (ZAROXOLYN) 2.5 MG tablet TAKE 1 TABLET BY MOUTH DAILY 30 MINUTES PRIOR TO YOUR MORNING FLUID PILL DOSE 2 DAYS PER WEEK. 10 tablet 0  . Multiple Vitamins-Minerals (PX MENS MULTIVITAMINS) TABS Take 1 tablet by mouth daily.    . ondansetron (ZOFRAN) 4 MG tablet TAKE 1 TABLET BY MOUTH EVERY 6 HOURS AS NEEDED FOR NAUSEA 20 tablet 0  . potassium chloride SA (K-DUR,KLOR-CON) 20 MEQ tablet TAKE 1 TABLET BY MOUTH THREE TIMES DAILY 90 tablet 6  . sildenafil (VIAGRA) 100 MG tablet Take 100 mg by mouth daily as needed for erectile dysfunction.     [name] spironolactone (ALDACTONE) 25 MG tablet TAKE 1 TABLET BY MOUTH EVERY DAY 30 tablet 6  . terazosin (HYTRIN) 1 MG capsule TAKE 1 CAPSULE BY MOUTH EVERY NIGHT AT BEDTIME 30 capsule 5  . traMADol (ULTRAM) 50 MG tablet Take 50 mg by mouth every 6 (six) hours as needed.    . valACYclovir (VALTREX) 500 MG tablet Take 1 tablet (500 mg total) by mouth daily. 30 tablet 5  . warfarin (COUMADIN) 7.5 MG tablet TAKE 1 TABLET AS DIRECTED BY COUMADIN CLINIC 40 tablet 3   No current facility-administered medications on file prior to visit.    BP 124/60 mmHg  Pulse 50  Temp(Src) 98.1 F (36.7 C) (Oral)  Resp 16  Ht 6' (1.829 m)  Wt 286 lb 12.8 oz (130.092 kg)  BMI 38.89 kg/m2  SpO2 96%       Objective:   Physical Exam  Constitutional: He is oriented to person, place, and time. He appears well-developed and well-nourished. No distress.  HENT:  Head: Normocephalic and atraumatic.  Cardiovascular: Normal rate and regular rhythm.   No murmur heard. Pulmonary/Chest: Effort normal and breath sounds normal. No respiratory distress. He has no wheezes. He has no rales.  Musculoskeletal: He exhibits no edema.  Neurological: He is alert and oriented to person,  place, and time.  Skin: Skin is warm and dry.  Psychiatric: He has a normal mood and affect. His behavior is normal. Thought content normal.          Assessment & Plan:  Pt given One Touch verio flex glucose meter and rx for strips provided to patient today.

## 2014-12-16 NOTE — Assessment & Plan Note (Signed)
Stable on current meds. Obtain urine microalbumin and A1C

## 2015-01-01 ENCOUNTER — Other Ambulatory Visit: Payer: Medicare Other

## 2015-01-01 DIAGNOSIS — D649 Anemia, unspecified: Secondary | ICD-10-CM

## 2015-01-01 LAB — FECAL OCCULT BLOOD, IMMUNOCHEMICAL: Fecal Occult Bld: NEGATIVE

## 2015-01-02 ENCOUNTER — Ambulatory Visit (INDEPENDENT_AMBULATORY_CARE_PROVIDER_SITE_OTHER): Payer: Medicare Other | Admitting: *Deleted

## 2015-01-02 DIAGNOSIS — I4891 Unspecified atrial fibrillation: Secondary | ICD-10-CM

## 2015-01-02 DIAGNOSIS — Z5181 Encounter for therapeutic drug level monitoring: Secondary | ICD-10-CM | POA: Diagnosis not present

## 2015-01-02 DIAGNOSIS — Z7901 Long term (current) use of anticoagulants: Secondary | ICD-10-CM | POA: Diagnosis not present

## 2015-01-02 LAB — POCT INR: INR: 2.4

## 2015-01-05 ENCOUNTER — Other Ambulatory Visit: Payer: Self-pay | Admitting: Family

## 2015-01-12 ENCOUNTER — Other Ambulatory Visit (HOSPITAL_COMMUNITY): Payer: Self-pay | Admitting: Internal Medicine

## 2015-01-12 ENCOUNTER — Other Ambulatory Visit: Payer: Self-pay | Admitting: Cardiology

## 2015-01-14 ENCOUNTER — Encounter (HOSPITAL_COMMUNITY): Payer: Self-pay

## 2015-01-14 ENCOUNTER — Ambulatory Visit (HOSPITAL_COMMUNITY)
Admission: RE | Admit: 2015-01-14 | Discharge: 2015-01-14 | Disposition: A | Discharge status: Home / Self Care | Payer: Medicare Other | Source: Ambulatory Visit | Attending: Internal Medicine | Admitting: Internal Medicine

## 2015-01-14 VITALS — BP 120/74 | HR 43 | Wt 283.2 lb

## 2015-01-14 DIAGNOSIS — G4733 Obstructive sleep apnea (adult) (pediatric): Secondary | ICD-10-CM | POA: Insufficient documentation

## 2015-01-14 DIAGNOSIS — I5032 Chronic diastolic (congestive) heart failure: Secondary | ICD-10-CM

## 2015-01-14 DIAGNOSIS — Z8249 Family history of ischemic heart disease and other diseases of the circulatory system: Secondary | ICD-10-CM | POA: Insufficient documentation

## 2015-01-14 DIAGNOSIS — I482 Chronic atrial fibrillation, unspecified: Secondary | ICD-10-CM

## 2015-01-14 DIAGNOSIS — Z7901 Long term (current) use of anticoagulants: Secondary | ICD-10-CM | POA: Insufficient documentation

## 2015-01-14 DIAGNOSIS — I5022 Chronic systolic (congestive) heart failure: Secondary | ICD-10-CM | POA: Insufficient documentation

## 2015-01-14 DIAGNOSIS — M069 Rheumatoid arthritis, unspecified: Secondary | ICD-10-CM | POA: Diagnosis not present

## 2015-01-14 DIAGNOSIS — I129 Hypertensive chronic kidney disease with stage 1 through stage 4 chronic kidney disease, or unspecified chronic kidney disease: Secondary | ICD-10-CM | POA: Diagnosis not present

## 2015-01-14 DIAGNOSIS — N189 Chronic kidney disease, unspecified: Secondary | ICD-10-CM | POA: Diagnosis not present

## 2015-01-14 NOTE — Patient Instructions (Signed)
Follow up in 6 months with an echocardiogram

## 2015-01-14 NOTE — Addendum Note (Signed)
Encounter addended by: Harvie Junior, CMA on: 01/14/2015  9:27 AM<BR>     Documentation filed: Dx Association, Patient Instructions Section, Orders

## 2015-01-14 NOTE — Progress Notes (Signed)
Patient ID: Bruce Cole, male   DOB: 11/02/1940, 74 y.o.   MRN: [name]  HPI: 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 He has been on chronic coumadin therapy.   R/LHC 02/2013 RA = 28 with prominent v-waves  RV = 67/21/28  PA = 70/23 (42)  PCW = 29  Fick cardiac output/index = 7.5/2.8  PVR = 1.8 Woods  SVR = 610  FA sat = 96%  PA sat = 64%, 70%  No RV LV interaction  Near equalization of RV, LV and RA diastolic pressures ** Essentially normal coronary arteries**  Echo 03/2013 EF 45-50% with grade II diastolic dysfunction, moderate to severe RV dilation with mild systolic dysfunction, PA systolic pressure 60 mmHg.   Since last appointment, he has been doing well. Remains active. Goes to gym 2 days a week and does cyclin and aerobics. Playing golf. 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 HR in the 40s-50s but this is chronic for him x years.  No lightheadedness or syncope. Takes lasix 80/40 and then metolazone every 3 days. Watches fluid intake.  No edema. Weight is down 3-4 lbs.  He denies exertional dyspnea or chest pain.  No orthopnea or PND.  Had another sleep study and told he didn't need anything. 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  Past Medical History  Diagnosis Date  . Nephrolithiasis   .  Arthritis     rheumatoid  . Colon polyp 2000  . Atrial fibrillation   . Unspecified essential hypertension   . Gout     Current Outpatient Prescriptions  Medication Sig Dispense Refill  . allopurinol (ZYLOPRIM) 100 MG tablet TAKE 2 TABLETS BY MOUTH EVERY DAY 60 tablet 2  . amLODipine (NORVASC) 10 MG tablet TAKE 1 TABLET BY MOUTH EVERY DAY 30 tablet 3  . carvedilol (COREG) 12.5 MG tablet TAKE 1 TABLET BY MOUTH EVERY MORNING AND 1/2 TABLET BY MOUTH EVERY EVENING 45 tablet 0  . colchicine 0.6 MG tablet TAKE 2 TABLETS BY MOUTH AT THE START OF A GOUT FLARE AND TAKE 1 TABLET 3 HOURS LATER; MAX OF 3 TABLETS PER GOUT ATTACK 30 tablet 0  . fluticasone (FLONASE) 50 MCG/ACT nasal spray Place 2 sprays into both nostrils daily. 16 g 6  . furosemide (LASIX) 40 MG tablet Take 40 mg by mouth. Take 80mg  (2 tablets) AM and 40mg  (1tablet) PM    . glucose blood test strip Use as to check blood sugar 1 to 2 times a day.  DX  E11.8    .  hydrALAZINE (APRESOLINE) 50 MG tablet TAKE 1 TABLET BY MOUTH THREE TIMES DAILY 90 tablet 5  . hydroxychloroquine (PLAQUENIL) 200 MG tablet Take 200 mg by mouth 2 (two) times daily.    . isosorbide mononitrate (IMDUR) 30 MG 24 hr tablet Take 1 tablet (30 mg total) by mouth daily. 30 tablet 6  . metolazone (ZAROXOLYN) 2.5 MG tablet TAKE 1 TABLET BY MOUTH DAILY 30 MINUTES PRIOR TO YOUR MORNING FLUID PILL DOSE 2 DAYS PER WEEK. 10 tablet 0  . Multiple Vitamins-Minerals (PX MENS MULTIVITAMINS) TABS Take 1 tablet by mouth daily.    . ondansetron (ZOFRAN) 4 MG tablet TAKE 1 TABLET BY MOUTH EVERY 6 HOURS AS NEEDED FOR NAUSEA 20 tablet 0  . potassium chloride SA (K-DUR,KLOR-CON) 20 MEQ tablet TAKE 1 TABLET BY MOUTH THREE TIMES DAILY 90 tablet 0  . sildenafil (VIAGRA) 100 MG tablet Take 100 mg by mouth daily as needed for erectile dysfunction.     [name] spironolactone (ALDACTONE) 25 MG tablet TAKE 1 TABLET BY MOUTH DAILY 30 tablet 0  . terazosin (HYTRIN) 1 MG capsule TAKE 1 CAPSULE BY MOUTH  EVERY NIGHT AT BEDTIME 30 capsule 5  . traMADol (ULTRAM) 50 MG tablet Take 50 mg by mouth every 6 (six) hours as needed.    . valACYclovir (VALTREX) 500 MG tablet Take 1 tablet (500 mg total) by mouth daily. 30 tablet 5  . warfarin (COUMADIN) 7.5 MG tablet TAKE 1 TABLET AS DIRECTED BY COUMADIN CLINIC 40 tablet 3   No current facility-administered medications for this encounter.   PHYSICAL EXAM: Filed Vitals:   01/14/15 0832  BP: 120/74  Pulse: 43  Weight: 283 lb 4 oz (128.481 kg)  SpO2: 99%   General:  Well appearing. No resp difficulty. Ambulated into clinic with a cane.  HEENT: normal Neck: supple. JVP 7 cm. Carotids 2+ bilaterally; no bruits. No lymphadenopathy or thryomegaly appreciated. Cor: PMI normal. Bradycardic, regular rate & rhythm. No rubs, gallops.  2/6 early SEM RUSB Lungs: clear Abdomen: soft, nontender, nondistended. No hepatosplenomegaly. No bruits or masses. Good bowel sounds. Extremities: no cyanosis, clubbing, rash,  no edema Neuro: alert & orientedx3, cranial nerves grossly intact. Moves all 4 extremities w/o difficulty. Affect pleasant.  ASSESSMENT & PLAN: 1) Chronic systolic HF with prominent RV failure: Echo 03/2013 EF 45-50% with grade II diastolic dysfunction, moderate to severe RV dilation with mild systolic dysfunction, PA systolic pressure 60 mmHg. Prominent RV involvement.  RHC/LHC in 8/14 with no CAD, suggestive of restrictive physiology, and pulmonary venous hypertension (low PVR). NYHA II symptoms. Weight is down without volume overload on exam.   - Doing very well. NYHA I-II. Volume status stable. Last bloodwork ok. - Continue lasix 80 qam, 40 qpm and metolazone twice a week. Would back off metolazone if not needed.  - Continue Coreg 12.5 mg qam, 6.25 mg qpm.  - Continue hydralazine 50 mg tid and Imdur 30 mg daily.  He understands he should not use Viagra while on Imdur . - Will need f/u echo at next visit 2) OSA: Unable to tolerate CPAP previously. F/u  sleep study was improved.  3) Atrial fibrillation: Chronic, rate controlled. Continue coumadin. Not interested in NOAC. Bradycardia is chronic and asx.  4) CKD: Stable creatinine recently. Follows with [facility] Dr. Ulice Dash, Quillian Quince MD 01/14/2015

## 2015-01-16 ENCOUNTER — Other Ambulatory Visit (HOSPITAL_COMMUNITY): Payer: Self-pay | Admitting: Internal Medicine

## 2015-01-20 DIAGNOSIS — M0569 Rheumatoid arthritis of multiple sites with involvement of other organs and systems: Secondary | ICD-10-CM | POA: Diagnosis not present

## 2015-01-20 DIAGNOSIS — H2510 Age-related nuclear cataract, unspecified eye: Secondary | ICD-10-CM | POA: Diagnosis not present

## 2015-01-20 DIAGNOSIS — Z79899 Other long term (current) drug therapy: Secondary | ICD-10-CM | POA: Diagnosis not present

## 2015-01-31 ENCOUNTER — Other Ambulatory Visit: Payer: Self-pay | Admitting: Internal Medicine

## 2015-02-03 ENCOUNTER — Ambulatory Visit (INDEPENDENT_AMBULATORY_CARE_PROVIDER_SITE_OTHER): Payer: Medicare Other | Admitting: *Deleted

## 2015-02-03 DIAGNOSIS — I482 Chronic atrial fibrillation, unspecified: Secondary | ICD-10-CM

## 2015-02-03 DIAGNOSIS — Z7901 Long term (current) use of anticoagulants: Secondary | ICD-10-CM

## 2015-02-03 DIAGNOSIS — Z5181 Encounter for therapeutic drug level monitoring: Secondary | ICD-10-CM

## 2015-02-03 DIAGNOSIS — I4891 Unspecified atrial fibrillation: Secondary | ICD-10-CM

## 2015-02-03 LAB — POCT INR: INR: 2.3

## 2015-02-13 ENCOUNTER — Other Ambulatory Visit (HOSPITAL_COMMUNITY): Payer: Self-pay | Admitting: Internal Medicine

## 2015-02-13 ENCOUNTER — Other Ambulatory Visit: Payer: Self-pay | Admitting: Family

## 2015-02-14 ENCOUNTER — Other Ambulatory Visit (HOSPITAL_COMMUNITY): Payer: Self-pay | Admitting: Anesthesiology

## 2015-03-12 DIAGNOSIS — I739 Peripheral vascular disease, unspecified: Secondary | ICD-10-CM | POA: Diagnosis not present

## 2015-03-12 DIAGNOSIS — L603 Nail dystrophy: Secondary | ICD-10-CM | POA: Diagnosis not present

## 2015-03-15 ENCOUNTER — Other Ambulatory Visit (HOSPITAL_COMMUNITY): Payer: Self-pay | Admitting: Internal Medicine

## 2015-03-17 ENCOUNTER — Other Ambulatory Visit (HOSPITAL_COMMUNITY): Payer: Self-pay | Admitting: *Deleted

## 2015-03-17 ENCOUNTER — Ambulatory Visit (INDEPENDENT_AMBULATORY_CARE_PROVIDER_SITE_OTHER): Payer: Medicare Other | Admitting: *Deleted

## 2015-03-17 DIAGNOSIS — I4891 Unspecified atrial fibrillation: Secondary | ICD-10-CM | POA: Diagnosis not present

## 2015-03-17 DIAGNOSIS — Z5181 Encounter for therapeutic drug level monitoring: Secondary | ICD-10-CM | POA: Diagnosis not present

## 2015-03-17 DIAGNOSIS — Z7901 Long term (current) use of anticoagulants: Secondary | ICD-10-CM

## 2015-03-17 DIAGNOSIS — I482 Chronic atrial fibrillation, unspecified: Secondary | ICD-10-CM

## 2015-03-17 LAB — POCT INR: INR: 3

## 2015-03-17 MED ORDER — METOLAZONE 2.5 MG PO TABS: ORAL_TABLET | ORAL | Status: DC

## 2015-03-18 ENCOUNTER — Ambulatory Visit (INDEPENDENT_AMBULATORY_CARE_PROVIDER_SITE_OTHER): Payer: Medicare Other | Admitting: Family

## 2015-03-18 ENCOUNTER — Encounter: Payer: Self-pay | Admitting: Family

## 2015-03-18 VITALS — BP 118/70 | HR 48 | Temp 98.0°F | Resp 16 | Ht 72.0 in | Wt 284.0 lb

## 2015-03-18 DIAGNOSIS — I10 Essential (primary) hypertension: Secondary | ICD-10-CM | POA: Diagnosis not present

## 2015-03-18 DIAGNOSIS — E119 Type 2 diabetes mellitus without complications: Secondary | ICD-10-CM | POA: Diagnosis not present

## 2015-03-18 DIAGNOSIS — E118 Type 2 diabetes mellitus with unspecified complications: Secondary | ICD-10-CM | POA: Diagnosis not present

## 2015-03-18 DIAGNOSIS — M109 Gout, unspecified: Secondary | ICD-10-CM | POA: Diagnosis not present

## 2015-03-18 LAB — HEMOGLOBIN A1C: Hgb A1c MFr Bld: 6.6 % — ABNORMAL HIGH (ref 4.6–6.5)

## 2015-03-18 LAB — BASIC METABOLIC PANEL
BUN: 47 mg/dL — AB (ref 6–23)
CALCIUM: 9.8 mg/dL (ref 8.4–10.5)
CO2: 29 mEq/L (ref 19–32)
Chloride: 100 mEq/L (ref 96–112)
Creatinine, Ser: 2.04 mg/dL — ABNORMAL HIGH (ref 0.40–1.50)
GFR: 41.51 mL/min — AB (ref >60.00)
GLUCOSE: 103 mg/dL — AB (ref 70–99)
POTASSIUM: 3.6 meq/L (ref 3.5–5.1)
SODIUM: 139 meq/L (ref 135–145)

## 2015-03-18 LAB — LIPID PANEL
CHOLESTEROL: 176 mg/dL (ref 0–200)
HDL: 67.4 mg/dL (ref >39.00)
LDL Cholesterol: 95 mg/dL (ref 0–99)
NONHDL: 109.01
Total CHOL/HDL Ratio: 3
Triglycerides: 70 mg/dL (ref 0.0–149.0)
VLDL: 14 mg/dL (ref 0.0–40.0)

## 2015-03-18 MED ORDER — FLUTICASONE PROPIONATE 50 MCG/ACT NA SUSP: 2.0000 | Freq: Every day | NASAL | Status: DC

## 2015-03-18 NOTE — Progress Notes (Signed)
Pre visit review using our clinic review tool, if applicable. No additional management support is needed unless otherwise documented below in the visit note. 

## 2015-03-18 NOTE — Patient Instructions (Signed)
Please complete lab work prior to leaving. Follow up in 3 months.  

## 2015-03-18 NOTE — Progress Notes (Signed)
Subjective:    Patient ID: Bruce Cole, male    DOB: [birth year], 74 y.o.   MRN: [id number]  HPI  Mr Fabregas presents today for follow up.  Diabetes-  maintained on diet alone.   Lab Results  Component Value Date   HGBA1C 6.4 12/16/2014   HGBA1C 6.7* 06/23/2014   HGBA1C 6.7* 03/17/2014   Lab Results  Component Value Date   MICROALBUR 20.7* 12/16/2014   LDLCALC 83 03/17/2014   CREATININE 1.85* 12/10/2014   HTN-  On amlodipine, carvedilol, aldactone, zaroxolyn, hytrin.   BP Readings from Last 3 Encounters:  03/18/15 118/70  01/14/15 120/74  12/16/14 124/60   Gout- maintained on allopurinol.  No recent flare ups.    Lab Results  Component Value Date   CHOL 167 03/17/2014   HDL 70.30 03/17/2014   LDLCALC 83 03/17/2014   TRIG 71.0 03/17/2014   CHOLHDL 2 03/17/2014     Review of Systems See HPI  Past Medical History  Diagnosis Date  . Nephrolithiasis   . Arthritis     rheumatoid  . Colon polyp 2000  . Atrial fibrillation   . Unspecified essential hypertension   . Gout     Social History   Social History  . Marital Status: Single    Spouse Name: N/A  . Number of Children: 3  . Years of Education: N/A   Occupational History  .     Social History Main Topics  . Smoking status: Never Smoker   . Smokeless tobacco: Never Used  . Alcohol Use: Yes     Comment: occasional  . Drug Use: No  . Sexual Activity: Not on file   Other Topics Concern  . 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 Spine surgery      x 2  . Joint replacement      Total L-Hip replacement, Right Knee 10/20/09  . Cholecystectomy  1994  . Left and right heart catheterization with coronary angiogram N/A 02/22/2013    Procedure: LEFT AND RIGHT HEART CATHETERIZATION WITH CORONARY ANGIOGRAM;  Surgeon: Jolaine Artist, MD;  Location: [facility] CATH LAB;  Service: Cardiovascular;   Laterality: N/A;    Family History  Problem Relation Age of Onset  . Hypertension Mother   . Arthritis Mother     ?RA  . Hypertension Father     Allergies  Allergen Reactions  . Ace Inhibitors     Worsening renal insufficiency    Current Outpatient Prescriptions on File Prior to Visit  Medication Sig Dispense Refill  . allopurinol (ZYLOPRIM) 100 MG tablet TAKE 2 TABLETS BY MOUTH EVERY DAY 60 tablet 3  . amLODipine (NORVASC) 10 MG tablet TAKE 1 TABLET BY MOUTH EVERY DAY 30 tablet 3  . carvedilol (COREG) 12.5 MG tablet TAKE 1 TABLET BY MOUTH EVERY MORNING AND TAKE 1/2 TABLET BY MOUTH IN THE EVENING 45 tablet 3  . colchicine 0.6 MG tablet TAKE 2 TABLETS BY MOUTH AT THE START OF A GOUT FLARE AND TAKE 1 TABLET 3 HOURS LATER; MAX OF 3 TABLETS PER GOUT ATTACK 30 tablet 0  . fluticasone (FLONASE) 50 MCG/ACT nasal spray Place 2 sprays into both nostrils daily. 16 g 6  . furosemide (LASIX) 40 MG tablet Take 80mg  (2 tablets) AM and 40mg  (1tablet) PM    . glucose blood test strip Use as to check blood  sugar 1 to 2 times a day.  DX  E11.8    . hydrALAZINE (APRESOLINE) 50 MG tablet TAKE 1 TABLET BY MOUTH THREE TIMES DAILY 90 tablet 5  . hydroxychloroquine (PLAQUENIL) 200 MG tablet Take 200 mg by mouth 2 (two) times daily.    . isosorbide mononitrate (IMDUR) 30 MG 24 hr tablet Take 1 tablet (30 mg total) by mouth daily. 30 tablet 6  . isosorbide mononitrate (IMDUR) 30 MG 24 hr tablet TAKE 1 TABLET BY MOUTH DAILY 30 tablet 0  . metolazone (ZAROXOLYN) 2.5 MG tablet TAKE 1 TABLET BY MOUTH 30 MINUTES PRIOR TO YOUR MORNING FLUID PILL DOSE 2 DAYS PER WEEK 10 tablet 3  . Multiple Vitamins-Minerals (PX MENS MULTIVITAMINS) TABS Take 1 tablet by mouth daily.    . ondansetron (ZOFRAN) 4 MG tablet TAKE 1 TABLET BY MOUTH EVERY 6 HOURS AS NEEDED FOR NAUSEA 20 tablet 0  . potassium chloride SA (K-DUR,KLOR-CON) 20 MEQ tablet TAKE 1 TABLET BY MOUTH THREE TIMES DAILY 90 tablet 0  . sildenafil (VIAGRA) 100 MG tablet  Take 100 mg by mouth daily as needed for erectile dysfunction.     [name] spironolactone (ALDACTONE) 25 MG tablet TAKE 1 TABLET BY MOUTH DAILY 30 tablet 0  . terazosin (HYTRIN) 1 MG capsule TAKE 1 CAPSULE BY MOUTH EVERY NIGHT AT BEDTIME 30 capsule 5  . traMADol (ULTRAM) 50 MG tablet Take 50 mg by mouth every 6 (six) hours as needed.    . valACYclovir (VALTREX) 500 MG tablet Take 1 tablet (500 mg total) by mouth daily. 30 tablet 5  . warfarin (COUMADIN) 7.5 MG tablet TAKE 1 TABLET BY MOUTH EVERY DAILY AS DIRECTED PER COUMADIN CLINIC 40 tablet 3   No current facility-administered medications on file prior to visit.    BP 118/70 mmHg  Pulse 48  Temp(Src) 98 F (36.7 C) (Oral)  Resp 16  Ht 6' (1.829 m)  Wt 284 lb (128.822 kg)  BMI 38.51 kg/m2  SpO2 98%       Objective:   Physical Exam  Constitutional: He is oriented to person, place, and time. He appears well-developed and well-nourished. No distress.  HENT:  Head: Normocephalic and atraumatic.  Cardiovascular: Normal rate and regular rhythm.   No murmur heard. Pulmonary/Chest: Effort normal and breath sounds normal. No respiratory distress. He has no wheezes. He has no rales.  Musculoskeletal: He exhibits no edema.  Neurological: He is alert and oriented to person, place, and time.  Skin: Skin is warm and dry.  Psychiatric: He has a normal mood and affect. His behavior is normal. Thought content normal.          Assessment & Plan:

## 2015-03-18 NOTE — Assessment & Plan Note (Signed)
BP stable on current meds. Continue same.  

## 2015-03-18 NOTE — Assessment & Plan Note (Signed)
Clinically stable.  Obtain A1C, bmet, declines flu shot.

## 2015-03-18 NOTE — Assessment & Plan Note (Signed)
Stable. Continue allopurinol. 

## 2015-03-19 ENCOUNTER — Encounter: Payer: Self-pay | Admitting: Family

## 2015-04-04 ENCOUNTER — Other Ambulatory Visit: Payer: Self-pay | Admitting: Family

## 2015-04-11 ENCOUNTER — Other Ambulatory Visit: Payer: Self-pay | Admitting: Family

## 2015-04-15 ENCOUNTER — Other Ambulatory Visit: Payer: Self-pay

## 2015-04-15 ENCOUNTER — Emergency Department (HOSPITAL_BASED_OUTPATIENT_CLINIC_OR_DEPARTMENT_OTHER): Payer: Medicare Other

## 2015-04-15 ENCOUNTER — Encounter (HOSPITAL_BASED_OUTPATIENT_CLINIC_OR_DEPARTMENT_OTHER): Payer: Self-pay

## 2015-04-15 ENCOUNTER — Emergency Department (HOSPITAL_BASED_OUTPATIENT_CLINIC_OR_DEPARTMENT_OTHER)
Admission: EM | Admit: 2015-04-15 | Discharge: 2015-04-15 | Disposition: A | Discharge status: Home / Self Care | Payer: Medicare Other | Attending: Emergency Medicine | Admitting: Emergency Medicine

## 2015-04-15 DIAGNOSIS — I509 Heart failure, unspecified: Secondary | ICD-10-CM | POA: Diagnosis not present

## 2015-04-15 DIAGNOSIS — Z86010 Personal history of colonic polyps: Secondary | ICD-10-CM | POA: Insufficient documentation

## 2015-04-15 DIAGNOSIS — Z79899 Other long term (current) drug therapy: Secondary | ICD-10-CM | POA: Insufficient documentation

## 2015-04-15 DIAGNOSIS — Z87442 Personal history of urinary calculi: Secondary | ICD-10-CM | POA: Insufficient documentation

## 2015-04-15 DIAGNOSIS — I10 Essential (primary) hypertension: Secondary | ICD-10-CM | POA: Diagnosis not present

## 2015-04-15 DIAGNOSIS — I4891 Unspecified atrial fibrillation: Secondary | ICD-10-CM | POA: Diagnosis not present

## 2015-04-15 DIAGNOSIS — R0602 Shortness of breath: Secondary | ICD-10-CM | POA: Diagnosis present

## 2015-04-15 DIAGNOSIS — Z7901 Long term (current) use of anticoagulants: Secondary | ICD-10-CM | POA: Insufficient documentation

## 2015-04-15 DIAGNOSIS — Z7951 Long term (current) use of inhaled steroids: Secondary | ICD-10-CM | POA: Insufficient documentation

## 2015-04-15 DIAGNOSIS — M109 Gout, unspecified: Secondary | ICD-10-CM | POA: Insufficient documentation

## 2015-04-15 LAB — COMPREHENSIVE METABOLIC PANEL
ALBUMIN: 4.3 g/dL (ref 3.5–5.0)
ALK PHOS: 123 U/L (ref 38–126)
ALT: 16 U/L — AB (ref 17–63)
ANION GAP: 10 (ref 5–15)
AST: 31 U/L (ref 15–41)
BUN: 53 mg/dL — AB (ref 6–20)
CALCIUM: 9.3 mg/dL (ref 8.9–10.3)
CO2: 26 mmol/L (ref 22–32)
CREATININE: 2.06 mg/dL — AB (ref 0.61–1.24)
Chloride: 101 mmol/L (ref 101–111)
GFR calc Af Amer: 36 mL/min — ABNORMAL LOW (ref >60)
GFR calc non Af Amer: 31 mL/min — ABNORMAL LOW (ref >60)
GLUCOSE: 130 mg/dL — AB (ref 65–99)
Potassium: 3.7 mmol/L (ref 3.5–5.1)
SODIUM: 137 mmol/L (ref 135–145)
Total Bilirubin: 0.9 mg/dL (ref 0.3–1.2)
Total Protein: 7.7 g/dL (ref 6.5–8.1)

## 2015-04-15 LAB — URINALYSIS, ROUTINE W REFLEX MICROSCOPIC
Bilirubin Urine: NEGATIVE
Glucose, UA: NEGATIVE mg/dL
Hgb urine dipstick: NEGATIVE
KETONES UR: NEGATIVE mg/dL
LEUKOCYTES UA: NEGATIVE
NITRITE: NEGATIVE
PH: 6 (ref 5.0–8.0)
Protein, ur: NEGATIVE mg/dL
Specific Gravity, Urine: 1.009 (ref 1.005–1.030)
Urobilinogen, UA: 1 mg/dL (ref 0.0–1.0)

## 2015-04-15 LAB — CBC WITH DIFFERENTIAL/PLATELET
BASOS PCT: 0 %
Basophils Absolute: 0 10*3/uL (ref 0.0–0.1)
EOS ABS: 0.1 10*3/uL (ref 0.0–0.7)
Eosinophils Relative: 1 %
HEMATOCRIT: 32.1 % — AB (ref 39.0–52.0)
HEMOGLOBIN: 10.5 g/dL — AB (ref 13.0–17.0)
Lymphocytes Relative: 19 %
Lymphs Abs: 1 10*3/uL (ref 0.7–4.0)
MCH: 30.1 pg (ref 26.0–34.0)
MCHC: 32.7 g/dL (ref 30.0–36.0)
MCV: 92 fL (ref 78.0–100.0)
Monocytes Absolute: 0.4 10*3/uL (ref 0.1–1.0)
Monocytes Relative: 7 %
NEUTROS ABS: 4 10*3/uL (ref 1.7–7.7)
NEUTROS PCT: 73 %
Platelets: 148 10*3/uL — ABNORMAL LOW (ref 150–400)
RBC: 3.49 MIL/uL — AB (ref 4.22–5.81)
RDW: 16.3 % — ABNORMAL HIGH (ref 11.5–15.5)
WBC: 5.4 10*3/uL (ref 4.0–10.5)

## 2015-04-15 LAB — PROTIME-INR
INR: 2.56 — AB (ref 0.00–1.49)
Prothrombin Time: 27.2 seconds — ABNORMAL HIGH (ref 11.6–15.2)

## 2015-04-15 LAB — TROPONIN I: Troponin I: 0.06 ng/mL — ABNORMAL HIGH (ref <0.031)

## 2015-04-15 LAB — LIPASE, BLOOD: Lipase: 47 U/L (ref 22–51)

## 2015-04-15 LAB — BRAIN NATRIURETIC PEPTIDE: B NATRIURETIC PEPTIDE 5: 137.9 pg/mL — AB (ref 0.0–100.0)

## 2015-04-15 MED ORDER — FUROSEMIDE 10 MG/ML IJ SOLN
80.0000 mg | Freq: Once | INTRAMUSCULAR | Status: AC
Start: 2015-04-15 — End: 2015-04-15
  Administered 2015-04-15: 80 mg via INTRAVENOUS
  Filled 2015-04-15: qty 8

## 2015-04-15 NOTE — ED Notes (Signed)
Pt. returned from XR. 

## 2015-04-15 NOTE — ED Notes (Signed)
MD at bedside. 

## 2015-04-15 NOTE — ED Provider Notes (Addendum)
CSN: [id number]     Arrival date & time 04/15/15  0830 History   First MD Initiated Contact with Patient 04/15/15 [phone]     Chief Complaint  Patient presents with  . Shortness of Breath     (Consider location/radiation/quality/duration/timing/severity/associated sxs/prior Treatment) Patient is a 74 y.o. male presenting with shortness of breath. The history is provided by the patient.  Shortness of Breath Severity:  Moderate Onset quality:  Gradual Duration:  1 week Timing:  Constant Progression:  Worsening Chronicity:  Recurrent Context: activity   Relieved by:  Rest Worsened by:  Exertion Ineffective treatments:  None tried Associated symptoms: cough and sputum production   Associated symptoms: no abdominal pain, no chest pain, no fever, no headaches, no vomiting and no wheezing   Associated symptoms comment:  No orthopnea or PND.  SOB is only with walking and resolves with sitting down.  Feeling like his stomach is swollen but no pain. Risk factors: no hx of PE/DVT, no prolonged immobilization, no recent surgery and no tobacco use     Past Medical History  Diagnosis Date  . Nephrolithiasis   . Arthritis     rheumatoid  . Colon polyp 2000  . Atrial fibrillation   . Unspecified essential hypertension   . Gout    Past Surgical History  Procedure Laterality Date  . Spine surgery      x 2  . Joint replacement      Total L-Hip replacement, Right Knee 10/20/09  . Cholecystectomy  1994  . Left and right heart catheterization with coronary angiogram N/A 02/22/2013    Procedure: LEFT AND RIGHT HEART CATHETERIZATION WITH CORONARY ANGIOGRAM;  Surgeon: Jolaine Artist, MD;  Location: [facility] CATH LAB;  Service: Cardiovascular;  Laterality: N/A;   Family History  Problem Relation Age of Onset  . Hypertension Mother   . Arthritis Mother     ?RA  . Hypertension Father    Social History  Substance Use Topics  . Smoking status: Never Smoker   . Smokeless tobacco: Never Used  .  Alcohol Use: Yes     Comment: occasional    Review of Systems  Constitutional: Negative for fever.  Respiratory: Positive for cough, sputum production and shortness of breath. Negative for wheezing.   Cardiovascular: Negative for chest pain.  Gastrointestinal: Negative for vomiting and abdominal pain.  Neurological: Negative for headaches.  All other systems reviewed and are negative.     Allergies  Ace inhibitors  Home Medications   Prior to Admission medications   Medication Sig Start Date End Date Taking? Authorizing Parv Manthey  allopurinol (ZYLOPRIM) 100 MG tablet TAKE 2 TABLETS BY MOUTH EVERY DAY 02/13/15   Debbrah Alar, NP  amLODipine (NORVASC) 10 MG tablet TAKE 1 TABLET BY MOUTH EVERY DAY 12/16/14   Debbrah Alar, NP  carvedilol (COREG) 12.5 MG tablet TAKE 1 TABLET BY MOUTH EVERY MORNING AND TAKE 1/2 TABLET BY MOUTH IN THE EVENING 02/13/15   Debbrah Alar, NP  colchicine 0.6 MG tablet TAKE 2 TABLETS BY MOUTH AT THE START OF A GOUT FLARE AND TAKE 1 TABLET 3 HOURS LATER; MAX OF 3 TABLETS PER GOUT ATTACK 01/05/15   Debbrah Alar, NP  fluticasone (FLONASE) 50 MCG/ACT nasal spray Place 2 sprays into both nostrils daily. 03/18/15   Debbrah Alar, NP  furosemide (LASIX) 40 MG tablet Take 80mg  (2 tablets) AM and 40mg  (1tablet) PM    Historical Michaeleen Down, MD  glucose blood test strip Use as to check blood sugar 1  to 2 times a day.  DX  E11.8    Historical Nazario Russom, MD  hydrALAZINE (APRESOLINE) 50 MG tablet TAKE 1 TABLET BY MOUTH THREE TIMES DAILY 04/13/15   Debbrah Alar, NP  hydroxychloroquine (PLAQUENIL) 200 MG tablet Take 200 mg by mouth 2 (two) times daily. 09/11/14   Bo Merino, MD  isosorbide mononitrate (IMDUR) 30 MG 24 hr tablet Take 1 tablet (30 mg total) by mouth daily. 07/14/14   Amy D Ninfa Meeker, NP  isosorbide mononitrate (IMDUR) 30 MG 24 hr tablet TAKE 1 TABLET BY MOUTH DAILY 02/16/15   Jolaine Artist, MD  metolazone (ZAROXOLYN) 2.5 MG tablet TAKE  1 TABLET BY MOUTH 30 MINUTES PRIOR TO YOUR MORNING FLUID PILL DOSE 2 DAYS PER WEEK 03/17/15   Jolaine Artist, MD  Multiple Vitamins-Minerals (PX MENS MULTIVITAMINS) TABS Take 1 tablet by mouth daily.    Historical Samil Mecham, MD  ondansetron (ZOFRAN) 4 MG tablet TAKE 1 TABLET BY MOUTH EVERY 6 HOURS AS NEEDED FOR NAUSEA 06/27/14   Debbrah Alar, NP  potassium chloride SA (K-DUR,KLOR-CON) 20 MEQ tablet TAKE 1 TABLET BY MOUTH THREE TIMES DAILY 03/16/15   Jolaine Artist, MD  sildenafil (VIAGRA) 100 MG tablet Take 100 mg by mouth daily as needed for erectile dysfunction.     Historical Tashira Torre, MD  spironolactone (ALDACTONE) 25 MG tablet TAKE 1 TABLET BY MOUTH DAILY 03/16/15   Jolaine Artist, MD  terazosin (HYTRIN) 1 MG capsule TAKE 1 CAPSULE BY MOUTH EVERY NIGHT AT BEDTIME 10/15/14   Debbrah Alar, NP  traMADol (ULTRAM) 50 MG tablet Take 50 mg by mouth every 6 (six) hours as needed.    Historical Maxamus Colao, MD  valACYclovir (VALTREX) 500 MG tablet Take 1 tablet (500 mg total) by mouth daily. 11/18/13   Debbrah Alar, NP  warfarin (COUMADIN) 7.5 MG tablet TAKE 1 TABLET BY MOUTH EVERY DAILY AS DIRECTED PER COUMADIN CLINIC 02/02/15   Evans Lance, MD   BP 135/74 mmHg  Pulse 49  Temp(Src) 98.1 F (36.7 C) (Oral)  Resp 21  Ht 6\' 2"  (1.88 m)  Wt 290 lb (131.543 kg)  BMI 37.22 kg/m2  SpO2 98% Physical Exam  Constitutional: He is oriented to person, place, and time. He appears well-developed and well-nourished. No distress.  HENT:  Head: Normocephalic and atraumatic.  Mouth/Throat: Oropharynx is clear and moist.  Eyes: Conjunctivae and EOM are normal. Pupils are equal, round, and reactive to light.  Neck: Normal range of motion. Neck supple. JVD present.  Cardiovascular: Regular rhythm and intact distal pulses.  Bradycardia present.   No murmur heard. Pulmonary/Chest: Effort normal and breath sounds normal. No respiratory distress. He has no wheezes. He has no rales.  Abdominal:  Soft. He exhibits distension. There is no tenderness. There is no rebound and no guarding.  Musculoskeletal: Normal range of motion. He exhibits edema. He exhibits no tenderness.  1+ lower ext edema in the left leg and trace in the right  Neurological: He is alert and oriented to person, place, and time.  Skin: Skin is warm and dry. No rash noted. No erythema.  Psychiatric: He has a normal mood and affect. His behavior is normal.  Nursing note and vitals reviewed.   ED Course  Procedures (including critical care time) Labs Review Labs Reviewed  CBC WITH DIFFERENTIAL/PLATELET - Abnormal; Notable for the following:    RBC 3.49 (*)    Hemoglobin 10.5 (*)    HCT 32.1 (*)    RDW 16.3 (*)  Platelets 148 (*)    All other components within normal limits  COMPREHENSIVE METABOLIC PANEL - Abnormal; Notable for the following:    Glucose, Bld 130 (*)    BUN 53 (*)    Creatinine, Ser 2.06 (*)    ALT 16 (*)    GFR calc non Af Amer 31 (*)    GFR calc Af Amer 36 (*)    All other components within normal limits  TROPONIN I - Abnormal; Notable for the following:    Troponin I 0.06 (*)    All other components within normal limits  BRAIN NATRIURETIC PEPTIDE - Abnormal; Notable for the following:    B Natriuretic Peptide 137.9 (*)    All other components within normal limits  LIPASE, BLOOD    Imaging Review Dg Chest 2 View  04/15/2015   CLINICAL DATA:  Shortness of breath. Lower extremity swelling. Symptoms for 1 week.  EXAM: CHEST  2 VIEW  COMPARISON:  PA and lateral chest 01/29/2013. Single view of the chest 12/10/2014.  FINDINGS: There is cardiomegaly without edema. The lungs are clear. No pneumothorax or pleural effusion.  IMPRESSION: Cardiomegaly without acute disease.   Electronically Signed   By: Inge Rise M.D.   On: 04/15/2015 09:20   I have personally reviewed and evaluated these images and lab results as part of my medical decision-making.   EKG Interpretation   Date/Time:   Wednesday April 15 2015 08:44:22 EDT Ventricular Rate:  56 PR Interval:    QRS Duration: 94 QT Interval:  432 QTC Calculation: 416 R Axis:   -72 Text Interpretation:  Junctional rhythm Left axis deviation Low voltage  QRS Inferior infarct , age undetermined Cannot rule out Anterior infarct ,  age undetermined No significant change since last tracing Confirmed by  [facility]  MD, Loree Fee ([id number]) on 04/15/2015 8:53:24 AM      MDM   Final diagnoses:  Acute congestive heart failure, unspecified congestive heart failure type    Patient presents today with a past history of multiple medical problems including ischemic cardiomyopathy, chronic renal disease and CHF currently taking 80 mg of Lasix a day and Zaroxolyn 3 times a week and symptoms of fluid overload. He has had a 7 pound weight gain with shortness of breath with exertion. Mild cough with white phlegm but no fever or sick contacts. He denies nausea, vomiting or diarrhea. No chest pain or palpitations.   Patient is well-appearing on exam with normal vital signs. Patient is chronically bradycardic and EKG today is unchanged. He does have evidence of fluid overload with lower extremity swelling, abdominal distention and mild JVD. Lungs are clear. Patient denies any increased salt intake or change in his medications. He denies missing any doses.  Renal function today is unchanged from prior 2.06 (his typical range is 1.7-2). Chest x-ray is within normal limits. BNP only mildly elevated at 137. INR therapeutic at 2.56.  Troponin is slightly elevated today at 0.06. Patient had a troponin done back in May and it was 0.04. Feel this is most likely a chronic leak from cardiomyopathy and chronic renal disease. Again he is having no chest pain or ACS type symptoms.  No symptoms concerning for infection at this time. Will give patient a dose of Lasix and observe for diuresis.  12:12 PM Patient has put out 1100 mL's and is feeling much better.  Feel that he is safe to be discharged home. He will follow-up with Dr. [name] and Dr. Nani Ravens. He was  given strict return precautions if he develops worsening shortness of breath, chest pain or worsening edema.  Patient understands and is agreeable to plan  Blanchie Dessert, MD 04/15/15 1212  Blanchie Dessert, MD 04/15/15 1213

## 2015-04-15 NOTE — Discharge Instructions (Signed)
Return to the ER for worsening shortness of breath, chest pain or worsening swelling Heart Failure Heart failure is a condition in which the heart has trouble pumping blood. This means your heart does not pump blood efficiently for your body to work well. In some cases of heart failure, fluid may back up into your lungs or you may have swelling (edema) in your lower legs. Heart failure is usually a long-term (chronic) condition. It is important for you to take good care of yourself and follow your health care provider's treatment plan. CAUSES  Some health conditions can cause heart failure. Those health conditions include:  High blood pressure (hypertension). Hypertension causes the heart muscle to work harder than normal. When pressure in the blood vessels is high, the heart needs to pump (contract) with more force in order to circulate blood throughout the body. High blood pressure eventually causes the heart to become stiff and weak.  Coronary artery disease (CAD). CAD is the buildup of cholesterol and fat (plaque) in the arteries of the heart. The blockage in the arteries deprives the heart muscle of oxygen and blood. This can cause chest pain and may lead to a heart attack. High blood pressure can also contribute to CAD.  Heart attack (myocardial infarction). A heart attack occurs when one or more arteries in the heart become blocked. The loss of oxygen damages the muscle tissue of the heart. When this happens, part of the heart muscle dies. The injured tissue does not contract as well and weakens the heart's ability to pump blood.  Abnormal heart valves. When the heart valves do not open and close properly, it can cause heart failure. This makes the heart muscle pump harder to keep the blood flowing.  Heart muscle disease (cardiomyopathy or myocarditis). Heart muscle disease is damage to the heart muscle from a variety of causes. These can include drug or alcohol abuse, infections, or unknown  reasons. These can increase the risk of heart failure.  Lung disease. Lung disease makes the heart work harder because the lungs do not work properly. This can cause a strain on the heart, leading it to fail.  Diabetes. Diabetes increases the risk of heart failure. High blood sugar contributes to high fat (lipid) levels in the blood. Diabetes can also cause slow damage to tiny blood vessels that carry important nutrients to the heart muscle. When the heart does not get enough oxygen and food, it can cause the heart to become weak and stiff. This leads to a heart that does not contract efficiently.  Other conditions can contribute to heart failure. These include abnormal heart rhythms, thyroid problems, and low blood counts (anemia). Certain unhealthy behaviors can increase the risk of heart failure, including:  Being overweight.  Smoking or chewing tobacco.  Eating foods high in fat and cholesterol.  Abusing illicit drugs or alcohol.  Lacking physical activity. SYMPTOMS  Heart failure symptoms may vary and can be hard to detect. Symptoms may include:  Shortness of breath with activity, such as climbing stairs.  Persistent cough.  Swelling of the feet, ankles, legs, or abdomen.  Unexplained weight gain.  Difficulty breathing when lying flat (orthopnea).  Waking from sleep because of the need to sit up and get more air.  Rapid heartbeat.  Fatigue and loss of energy.  Feeling light-headed, dizzy, or close to fainting.  Loss of appetite.  Nausea.  Increased urination during the night (nocturia). DIAGNOSIS  A diagnosis of heart failure is based on your history,  symptoms, physical examination, and diagnostic tests. Diagnostic tests for heart failure may include:  Echocardiography.  Electrocardiography.  Chest X-ray.  Blood tests.  Exercise stress test.  Cardiac angiography.  Radionuclide scans. TREATMENT  Treatment is aimed at managing the symptoms of heart  failure. Medicines, behavioral changes, or surgical intervention may be necessary to treat heart failure.  Medicines to help treat heart failure may include:  Angiotensin-converting enzyme (ACE) inhibitors. This type of medicine blocks the effects of a blood protein called angiotensin-converting enzyme. ACE inhibitors relax (dilate) the blood vessels and help lower blood pressure.  Angiotensin receptor blockers (ARBs). This type of medicine blocks the actions of a blood protein called angiotensin. Angiotensin receptor blockers dilate the blood vessels and help lower blood pressure.  Water pills (diuretics). Diuretics cause the kidneys to remove salt and water from the blood. The extra fluid is removed through urination. This loss of extra fluid lowers the volume of blood the heart pumps.  Beta blockers. These prevent the heart from beating too fast and improve heart muscle strength.  Digitalis. This increases the force of the heartbeat.  Healthy behavior changes include:  Obtaining and maintaining a healthy weight.  Stopping smoking or chewing tobacco.  Eating heart-healthy foods.  Limiting or avoiding alcohol.  Stopping illicit drug use.  Physical activity as directed by your health care provider.  Surgical treatment for heart failure may include:  A procedure to open blocked arteries, repair damaged heart valves, or remove damaged heart muscle tissue.  A pacemaker to improve heart muscle function and control certain abnormal heart rhythms.  An internal cardioverter defibrillator to treat certain serious abnormal heart rhythms.  A left ventricular assist device (LVAD) to assist the pumping ability of the heart. HOME CARE INSTRUCTIONS   Take medicines only as directed by your health care provider. Medicines are important in reducing the workload of your heart, slowing the progression of heart failure, and improving your symptoms.  Do not stop taking your medicine unless  directed by your health care provider.  Do not skip any dose of medicine.  Refill your prescriptions before you run out of medicine. Your medicines are needed every day.  Engage in moderate physical activity if directed by your health care provider. Moderate physical activity can benefit some people. The elderly and people with severe heart failure should consult with a health care provider for physical activity recommendations.  Eat heart-healthy foods. Food choices should be free of trans fat and low in saturated fat, cholesterol, and salt (sodium). Healthy choices include fresh or frozen fruits and vegetables, fish, lean meats, legumes, fat-free or low-fat dairy products, and whole grain or high fiber foods. Talk to a dietitian to learn more about heart-healthy foods.  Limit sodium if directed by your health care provider. Sodium restriction may reduce symptoms of heart failure in some people. Talk to a dietitian to learn more about heart-healthy seasonings.  Use healthy cooking methods. Healthy cooking methods include roasting, grilling, broiling, baking, poaching, steaming, or stir-frying. Talk to a dietitian to learn more about healthy cooking methods.  Limit fluids if directed by your health care provider. Fluid restriction may reduce symptoms of heart failure in some people.  Weigh yourself every day. Daily weights are important in the early recognition of excess fluid. You should weigh yourself every morning after you urinate and before you eat breakfast. Wear the same amount of clothing each time you weigh yourself. Record your daily weight. Provide your health care provider with your weight  record.  Monitor and record your blood pressure if directed by your health care provider.  Check your pulse if directed by your health care provider.  Lose weight if directed by your health care provider. Weight loss may reduce symptoms of heart failure in some people.  Stop smoking or chewing  tobacco. Nicotine makes your heart work harder by causing your blood vessels to constrict. Do not use nicotine gum or patches before talking to your health care provider.  Keep all follow-up visits as directed by your health care provider. This is important.  Limit alcohol intake to no more than 1 drink per day for nonpregnant women and 2 drinks per day for men. One drink equals 12 ounces of beer, 5 ounces of wine, or 1 ounces of hard liquor. Drinking more than that is harmful to your heart. Tell your health care provider if you drink alcohol several times a week. Talk with your health care provider about whether alcohol is safe for you. If your heart has already been damaged by alcohol or you have severe heart failure, drinking alcohol should be stopped completely.  Stop illicit drug use.  Stay up-to-date with immunizations. It is especially important to prevent respiratory infections through current pneumococcal and influenza immunizations.  Manage other health conditions such as hypertension, diabetes, thyroid disease, or abnormal heart rhythms as directed by your health care provider.  Learn to manage stress.  Plan rest periods when fatigued.  Learn strategies to manage high temperatures. If the weather is extremely hot:  Avoid vigorous physical activity.  Use air conditioning or fans or seek a cooler location.  Avoid caffeine and alcohol.  Wear loose-fitting, lightweight, and light-colored clothing.  Learn strategies to manage cold temperatures. If the weather is extremely cold:  Avoid vigorous physical activity.  Layer clothes.  Wear mittens or gloves, a hat, and a scarf when going outside.  Avoid alcohol.  Obtain ongoing education and support as needed.  Participate in or seek rehabilitation as needed to maintain or improve independence and quality of life. SEEK MEDICAL CARE IF:   Your weight increases by 03 lb/1.4 kg in 1 day or 05 lb/2.3 kg in a week.  You have  increasing shortness of breath that is unusual for you.  You are unable to participate in your usual physical activities.  You tire easily.  You cough more than normal, especially with physical activity.  You have any or more swelling in areas such as your hands, feet, ankles, or abdomen.  You are unable to sleep because it is hard to breathe.  You feel like your heart is beating fast (palpitations).  You become dizzy or light-headed upon standing up. SEEK IMMEDIATE MEDICAL CARE IF:   You have difficulty breathing.  There is a change in mental status such as decreased alertness or difficulty with concentration.  You have a pain or discomfort in your chest.  You have an episode of fainting (syncope). MAKE SURE YOU:   Understand these instructions.  Will watch your condition.  Will get help right away if you are not doing well or get worse. Document Released: 07/04/2005 Document Revised: 11/18/2013 Document Reviewed: 08/03/2012 [facility] Patient Information 2015 [place], [place]. This information is not intended to replace advice given to you by your health care provider. Make sure you discuss any questions you have with your health care provider.

## 2015-04-15 NOTE — ED Notes (Signed)
Pt requesting to have INR checked, will make MD aware. Pt has approximately 500 mL of urine in urinal. Placed in I&O, MD made aware as well. Pt advised that he will stay in the ER for a certain amount of time to see how much he diureses.

## 2015-04-15 NOTE — ED Notes (Signed)
Pt also reports some difficulty urinating.

## 2015-04-15 NOTE — ED Notes (Signed)
Pt reports nausea and shortness of breath x 2 days. Reports some cough and congestion, productive with white phlegm. Denies sick contacts. Denies pain in his chest. Reports feeling "bloated."

## 2015-04-17 ENCOUNTER — Telehealth: Payer: Self-pay

## 2015-04-17 ENCOUNTER — Other Ambulatory Visit (HOSPITAL_COMMUNITY): Payer: Self-pay | Admitting: Internal Medicine

## 2015-04-17 ENCOUNTER — Other Ambulatory Visit: Payer: Self-pay | Admitting: Family

## 2015-04-17 NOTE — Telephone Encounter (Signed)
Patient needs refill on medications. Last OV,02/2015 Last Filled 12/16/14 for 45 tabs. Next appointment 06/10/15

## 2015-04-17 NOTE — Telephone Encounter (Signed)
What medications please ?

## 2015-04-28 ENCOUNTER — Ambulatory Visit (INDEPENDENT_AMBULATORY_CARE_PROVIDER_SITE_OTHER): Payer: Medicare Other | Admitting: *Deleted

## 2015-04-28 DIAGNOSIS — I482 Chronic atrial fibrillation, unspecified: Secondary | ICD-10-CM

## 2015-04-28 DIAGNOSIS — Z7901 Long term (current) use of anticoagulants: Secondary | ICD-10-CM | POA: Diagnosis not present

## 2015-04-28 DIAGNOSIS — Z5181 Encounter for therapeutic drug level monitoring: Secondary | ICD-10-CM

## 2015-04-28 DIAGNOSIS — I4891 Unspecified atrial fibrillation: Secondary | ICD-10-CM | POA: Diagnosis not present

## 2015-04-28 LAB — POCT INR: INR: 2.2

## 2015-05-01 ENCOUNTER — Telehealth: Payer: Self-pay | Admitting: Family

## 2015-05-01 ENCOUNTER — Emergency Department (HOSPITAL_BASED_OUTPATIENT_CLINIC_OR_DEPARTMENT_OTHER)
Admission: EM | Admit: 2015-05-01 | Discharge: 2015-05-01 | Disposition: A | Discharge status: Home / Self Care | Payer: Medicare Other | Attending: Emergency Medicine | Admitting: Emergency Medicine

## 2015-05-01 ENCOUNTER — Emergency Department (HOSPITAL_BASED_OUTPATIENT_CLINIC_OR_DEPARTMENT_OTHER): Payer: Medicare Other

## 2015-05-01 ENCOUNTER — Encounter (HOSPITAL_BASED_OUTPATIENT_CLINIC_OR_DEPARTMENT_OTHER): Payer: Self-pay | Admitting: *Deleted

## 2015-05-01 DIAGNOSIS — Z87442 Personal history of urinary calculi: Secondary | ICD-10-CM | POA: Diagnosis not present

## 2015-05-01 DIAGNOSIS — M069 Rheumatoid arthritis, unspecified: Secondary | ICD-10-CM | POA: Diagnosis not present

## 2015-05-01 DIAGNOSIS — Z86010 Personal history of colonic polyps: Secondary | ICD-10-CM | POA: Insufficient documentation

## 2015-05-01 DIAGNOSIS — Z79899 Other long term (current) drug therapy: Secondary | ICD-10-CM | POA: Diagnosis not present

## 2015-05-01 DIAGNOSIS — I10 Essential (primary) hypertension: Secondary | ICD-10-CM | POA: Insufficient documentation

## 2015-05-01 DIAGNOSIS — R1030 Lower abdominal pain, unspecified: Secondary | ICD-10-CM | POA: Diagnosis not present

## 2015-05-01 DIAGNOSIS — Z7951 Long term (current) use of inhaled steroids: Secondary | ICD-10-CM | POA: Diagnosis not present

## 2015-05-01 DIAGNOSIS — R110 Nausea: Secondary | ICD-10-CM | POA: Diagnosis not present

## 2015-05-01 DIAGNOSIS — Z7901 Long term (current) use of anticoagulants: Secondary | ICD-10-CM | POA: Diagnosis not present

## 2015-05-01 DIAGNOSIS — R102 Pelvic and perineal pain: Secondary | ICD-10-CM | POA: Diagnosis not present

## 2015-05-01 DIAGNOSIS — I4891 Unspecified atrial fibrillation: Secondary | ICD-10-CM | POA: Diagnosis not present

## 2015-05-01 DIAGNOSIS — R52 Pain, unspecified: Secondary | ICD-10-CM

## 2015-05-01 DIAGNOSIS — M109 Gout, unspecified: Secondary | ICD-10-CM | POA: Diagnosis not present

## 2015-05-01 LAB — BASIC METABOLIC PANEL
ANION GAP: 10 (ref 5–15)
BUN: 88 mg/dL — ABNORMAL HIGH (ref 6–20)
CHLORIDE: 98 mmol/L — AB (ref 101–111)
CO2: 25 mmol/L (ref 22–32)
Calcium: 9.4 mg/dL (ref 8.9–10.3)
Creatinine, Ser: 2.91 mg/dL — ABNORMAL HIGH (ref 0.61–1.24)
GFR calc non Af Amer: 20 mL/min — ABNORMAL LOW (ref >60)
GFR, EST AFRICAN AMERICAN: 23 mL/min — AB (ref >60)
Glucose, Bld: 149 mg/dL — ABNORMAL HIGH (ref 65–99)
Potassium: 3.9 mmol/L (ref 3.5–5.1)
Sodium: 133 mmol/L — ABNORMAL LOW (ref 135–145)

## 2015-05-01 LAB — URINALYSIS, ROUTINE W REFLEX MICROSCOPIC
Bilirubin Urine: NEGATIVE
Glucose, UA: NEGATIVE mg/dL
Hgb urine dipstick: NEGATIVE
Ketones, ur: NEGATIVE mg/dL
LEUKOCYTES UA: NEGATIVE
NITRITE: NEGATIVE
Protein, ur: >300 mg/dL — AB
SPECIFIC GRAVITY, URINE: 1.015 (ref 1.005–1.030)
UROBILINOGEN UA: 1 mg/dL (ref 0.0–1.0)
pH: 6 (ref 5.0–8.0)

## 2015-05-01 LAB — CBC WITH DIFFERENTIAL/PLATELET
BASOS ABS: 0 10*3/uL (ref 0.0–0.1)
BASOS PCT: 1 %
Eosinophils Absolute: 0 10*3/uL (ref 0.0–0.7)
Eosinophils Relative: 1 %
HEMATOCRIT: 32.4 % — AB (ref 39.0–52.0)
HEMOGLOBIN: 10.8 g/dL — AB (ref 13.0–17.0)
LYMPHS PCT: 13 %
Lymphs Abs: 0.9 10*3/uL (ref 0.7–4.0)
MCH: 30.1 pg (ref 26.0–34.0)
MCHC: 33.3 g/dL (ref 30.0–36.0)
MCV: 90.3 fL (ref 78.0–100.0)
MONOS PCT: 8 %
Monocytes Absolute: 0.5 10*3/uL (ref 0.1–1.0)
NEUTROS ABS: 5.6 10*3/uL (ref 1.7–7.7)
NEUTROS PCT: 77 %
Platelets: 162 10*3/uL (ref 150–400)
RBC: 3.59 MIL/uL — ABNORMAL LOW (ref 4.22–5.81)
RDW: 16.2 % — ABNORMAL HIGH (ref 11.5–15.5)
WBC: 7.1 10*3/uL (ref 4.0–10.5)

## 2015-05-01 LAB — URINE MICROSCOPIC-ADD ON

## 2015-05-01 LAB — PROTIME-INR
INR: 2.83 — AB (ref 0.00–1.49)
PROTHROMBIN TIME: 29.3 s — AB (ref 11.6–15.2)

## 2015-05-01 MED ORDER — HYDROCODONE-ACETAMINOPHEN 5-325 MG PO TABS: 1.0000 | ORAL_TABLET | Freq: Four times a day (QID) | ORAL | Status: DC | PRN

## 2015-05-01 MED ORDER — ONDANSETRON 4 MG PO TBDP: 4.0000 mg | ORAL_TABLET | Freq: Three times a day (TID) | ORAL | Status: DC | PRN

## 2015-05-01 MED ORDER — SODIUM CHLORIDE 0.9 % IV SOLN
INTRAVENOUS | Status: DC
  Administered 2015-05-01: 1000 mL via INTRAVENOUS

## 2015-05-01 MED ORDER — ONDANSETRON HCL 4 MG/2ML IJ SOLN
4.0000 mg | Freq: Once | INTRAMUSCULAR | Status: AC
  Administered 2015-05-01: 4 mg via INTRAVENOUS
  Filled 2015-05-01: qty 2

## 2015-05-01 MED ORDER — IOHEXOL 300 MG/ML  SOLN
25.0000 mL | Freq: Once | INTRAMUSCULAR | Status: AC | PRN
  Administered 2015-05-01: 25 mL via ORAL

## 2015-05-01 MED ORDER — FENTANYL CITRATE (PF) 100 MCG/2ML IJ SOLN
50.0000 ug | Freq: Once | INTRAMUSCULAR | Status: AC
  Administered 2015-05-01: 50 ug via INTRAVENOUS
  Filled 2015-05-01: qty 2

## 2015-05-01 NOTE — Telephone Encounter (Signed)
Please arrange ED follow up with me next week.

## 2015-05-01 NOTE — Discharge Instructions (Signed)
Workup for the abdominal pain including negative urine without explanation for the pain. Take medications as directed Zofran for nausea and vomiting. Hydrocodone as needed for pain. Make an appointment to follow-up with your regular doctor. Return for any new or worse symptoms.

## 2015-05-01 NOTE — ED Notes (Signed)
Attempted to call son to come and pick up patient. No answer. Patient also reports that he is unable to get in touch with son for pick up

## 2015-05-01 NOTE — ED Notes (Signed)
Lower abd pain x 4 hours,  Nausea,  lbn 3 days ago states he feels like he has constipation

## 2015-05-01 NOTE — ED Provider Notes (Signed)
Results for orders placed or performed during the hospital encounter of 05/01/15  CBC with Differential  Result Value Ref Range   WBC 7.1 4.0 - 10.5 K/uL   RBC 3.59 (L) 4.22 - 5.81 MIL/uL   Hemoglobin 10.8 (L) 13.0 - 17.0 g/dL   HCT 32.4 (L) 39.0 - 52.0 %   MCV 90.3 78.0 - 100.0 fL   MCH 30.1 26.0 - 34.0 pg   MCHC 33.3 30.0 - 36.0 g/dL   RDW 16.2 (H) 11.5 - 15.5 %   Platelets 162 150 - 400 K/uL   Neutrophils Relative % 77 %   Neutro Abs 5.6 1.7 - 7.7 K/uL   Lymphocytes Relative 13 %   Lymphs Abs 0.9 0.7 - 4.0 K/uL   Monocytes Relative 8 %   Monocytes Absolute 0.5 0.1 - 1.0 K/uL   Eosinophils Relative 1 %   Eosinophils Absolute 0.0 0.0 - 0.7 K/uL   Basophils Relative 1 %   Basophils Absolute 0.0 0.0 - 0.1 K/uL  Basic metabolic panel  Result Value Ref Range   Sodium 133 (L) 135 - 145 mmol/L   Potassium 3.9 3.5 - 5.1 mmol/L   Chloride 98 (L) 101 - 111 mmol/L   CO2 25 22 - 32 mmol/L   Glucose, Bld 149 (H) 65 - 99 mg/dL   BUN 88 (H) 6 - 20 mg/dL   Creatinine, Ser 2.91 (H) 0.61 - 1.24 mg/dL   Calcium 9.4 8.9 - 10.3 mg/dL   GFR calc non Af Amer 20 (L) >60 mL/min   GFR calc Af Amer 23 (L) >60 mL/min   Anion gap 10 5 - 15  Urinalysis, Routine w reflex microscopic (not at [facility])  Result Value Ref Range   Color, Urine YELLOW YELLOW   APPearance CLEAR CLEAR   Specific Gravity, Urine 1.015 1.005 - 1.030   pH 6.0 5.0 - 8.0   Glucose, UA NEGATIVE NEGATIVE mg/dL   Hgb urine dipstick NEGATIVE NEGATIVE   Bilirubin Urine NEGATIVE NEGATIVE   Ketones, ur NEGATIVE NEGATIVE mg/dL   Protein, ur >300 (A) NEGATIVE mg/dL   Urobilinogen, UA 1.0 0.0 - 1.0 mg/dL   Nitrite NEGATIVE NEGATIVE   Leukocytes, UA NEGATIVE NEGATIVE  Protime-INR  Result Value Ref Range   Prothrombin Time 29.3 (H) 11.6 - 15.2 seconds   INR 2.83 (H) 0.00 - 1.49  Urine microscopic-add on  Result Value Ref Range   Squamous Epithelial / LPF FEW (A) RARE   WBC, UA 0-2 <3 WBC/hpf   RBC / HPF 0-2 <3 RBC/hpf   Bacteria, UA  FEW (A) RARE   Casts HYALINE CASTS (A) NEGATIVE   Ct Abdomen Pelvis Wo Contrast  05/01/2015  CLINICAL DATA:  Lower abdominal and pelvic pain for 24 hr.  Nausea. EXAM: CT ABDOMEN AND PELVIS WITHOUT CONTRAST TECHNIQUE: Multidetector CT imaging of the abdomen and pelvis was performed following the standard protocol without IV contrast. COMPARISON:  12/17/2012 FINDINGS: Lower chest: Moderate cardiomegaly demonstrated. Tiny right pleural effusion noted. These findings are stable compared to previous exam. Hepatobiliary: No mass visualized on this un-enhanced exam. Prior cholecystectomy. Pancreas: No pancreatic mass visualized on this noncontrast study. No peripancreatic fluid collections identified. Retroperitoneal soft tissue stranding surrounding the pancreas another retroperitoneal organs is chronic and unchanged in appearance. Spleen: Within normal limits in size. Adrenals/Urinary Tract: No evidence of adrenal mass. Symmetric bilateral perinephric stranding is seen which is nonspecific but chronic and unchanged since previous study. Multiple renal lesions are seen bilaterally which cannot be characterized  on this noncontrast study. There remain stable compared to previous exam in 2014, most likely representing renal cysts. There is no evidence of hydronephrosis. Stomach/Bowel: No evidence of obstruction, inflammatory process, or abnormal fluid collections. Vascular/Lymphatic: No pathologically enlarged lymph nodes. No evidence of abdominal aortic aneurysm. Reproductive: No mass or other significant abnormality. Other: Left hip prosthesis causes artifact through the inferior pelvis. Musculoskeletal: No suspicious bone lesions identified. Advanced right hip osteoarthritis noted. Advanced lumbar spine degenerative changes also noted. IMPRESSION: Stable chronic nonspecific retroperitoneal soft tissue stranding. No acute findings identified within the abdomen or pelvis. Electronically Signed   By: Earle Gell M.D.    On: 05/01/2015 07:32   Dg Chest 2 View  04/15/2015  CLINICAL DATA:  Shortness of breath. Lower extremity swelling. Symptoms for 1 week. EXAM: CHEST  2 VIEW COMPARISON:  PA and lateral chest 01/29/2013. Single view of the chest 12/10/2014. FINDINGS: There is cardiomegaly without edema. The lungs are clear. No pneumothorax or pleural effusion. IMPRESSION: Cardiomegaly without acute disease. Electronically Signed   By: Inge Rise M.D.   On: 04/15/2015 09:20    Workup for the abdominal pain without any significant findings on CT scan. Urinalysis also negative for urinary tract infection. Will treat patient symptomatically and have him follow-up with her regular doctor. No evidence of any significant constipation.  Fredia Sorrow, MD 05/01/15 [phone]

## 2015-05-01 NOTE — ED Provider Notes (Signed)
CSN: [id number]     Arrival date & time 05/01/15  0536 History   First MD Initiated Contact with Patient 05/01/15 0550     Chief Complaint  Patient presents with  . Abdominal Pain     (Consider location/radiation/quality/duration/timing/severity/associated sxs/prior Treatment) HPI  This is a 74 year old male with lower abdominal pain that began about midnight. The onset was gradual but is now severe. It is not significantly changed with movement. He describes the pain is sharp and feeling like he needs to move his bowels. He has not had a bowel movement in 3 days; he usually moves his bowels daily. He denies nausea or vomiting.  Past Medical History  Diagnosis Date  . Nephrolithiasis   . Arthritis     rheumatoid  . Colon polyp 2000  . Atrial fibrillation ([place])   . Unspecified essential hypertension   . Gout    Past Surgical History  Procedure Laterality Date  . Spine surgery      x 2  . Joint replacement      Total L-Hip replacement, Right Knee 10/20/09  . Cholecystectomy  1994  . Left and right heart catheterization with coronary angiogram N/A 02/22/2013    Procedure: LEFT AND RIGHT HEART CATHETERIZATION WITH CORONARY ANGIOGRAM;  Surgeon: Jolaine Artist, MD;  Location: [facility] CATH LAB;  Service: Cardiovascular;  Laterality: N/A;   Family History  Problem Relation Age of Onset  . Hypertension Mother   . Arthritis Mother     ?RA  . Hypertension Father    Social History  Substance Use Topics  . Smoking status: Never Smoker   . Smokeless tobacco: Never Used  . Alcohol Use: Yes     Comment: occasional    Review of Systems  All other systems reviewed and are negative.   Allergies  Ace inhibitors  Home Medications   Prior to Admission medications   Medication Sig Start Date End Date Taking? Authorizing Provider  allopurinol (ZYLOPRIM) 100 MG tablet TAKE 2 TABLETS BY MOUTH EVERY DAY 02/13/15   Debbrah Alar, NP  amLODipine (NORVASC) 10 MG tablet TAKE 1 TABLET BY  MOUTH EVERY DAY 04/17/15   Debbrah Alar, NP  carvedilol (COREG) 12.5 MG tablet TAKE 1 TABLET BY MOUTH EVERY MORNING AND TAKE 1/2 TABLET BY MOUTH IN THE EVENING 02/13/15   Debbrah Alar, NP  colchicine 0.6 MG tablet TAKE 2 TABLETS BY MOUTH AT THE START OF A GOUT FLARE AND TAKE 1 TABLET 3 HOURS LATER; MAX OF 3 TABLETS PER GOUT ATTACK 01/05/15   Debbrah Alar, NP  fluticasone (FLONASE) 50 MCG/ACT nasal spray Place 2 sprays into both nostrils daily. 03/18/15   Debbrah Alar, NP  furosemide (LASIX) 40 MG tablet Take 80mg  (2 tablets) AM and 40mg  (1tablet) PM    Historical Provider, MD  glucose blood test strip Use as to check blood sugar 1 to 2 times a day.  DX  E11.8    Historical Provider, MD  hydrALAZINE (APRESOLINE) 50 MG tablet TAKE 1 TABLET BY MOUTH THREE TIMES DAILY 04/13/15   Debbrah Alar, NP  hydroxychloroquine (PLAQUENIL) 200 MG tablet Take 200 mg by mouth 2 (two) times daily. 09/11/14   Bo Merino, MD  isosorbide mononitrate (IMDUR) 30 MG 24 hr tablet Take 1 tablet (30 mg total) by mouth daily. 07/14/14   Amy D Ninfa Meeker, NP  isosorbide mononitrate (IMDUR) 30 MG 24 hr tablet TAKE 1 TABLET BY MOUTH DAILY 02/16/15   Jolaine Artist, MD  metolazone (ZAROXOLYN) 2.5 MG tablet  TAKE 1 TABLET BY MOUTH 30 MINUTES PRIOR TO YOUR MORNING FLUID PILL DOSE 2 DAYS PER WEEK 03/17/15   Jolaine Artist, MD  Multiple Vitamins-Minerals (PX MENS MULTIVITAMINS) TABS Take 1 tablet by mouth daily.    Historical Provider, MD  ondansetron (ZOFRAN) 4 MG tablet TAKE 1 TABLET BY MOUTH EVERY 6 HOURS AS NEEDED FOR NAUSEA 06/27/14   Debbrah Alar, NP  potassium chloride SA (K-DUR,KLOR-CON) 20 MEQ tablet TAKE 1 TABLET BY MOUTH THREE TIMES DAILY 03/16/15   Jolaine Artist, MD  sildenafil (VIAGRA) 100 MG tablet Take 100 mg by mouth daily as needed for erectile dysfunction.     Historical Provider, MD  spironolactone (ALDACTONE) 25 MG tablet TAKE 1 TABLET BY MOUTH DAILY 04/17/15   Jolaine Artist,  MD  terazosin (HYTRIN) 1 MG capsule TAKE 1 CAPSULE BY MOUTH EVERY NIGHT AT BEDTIME 10/15/14   Debbrah Alar, NP  traMADol (ULTRAM) 50 MG tablet Take 50 mg by mouth every 6 (six) hours as needed.    Historical Provider, MD  valACYclovir (VALTREX) 500 MG tablet Take 1 tablet (500 mg total) by mouth daily. 11/18/13   Debbrah Alar, NP  warfarin (COUMADIN) 7.5 MG tablet TAKE 1 TABLET BY MOUTH EVERY DAILY AS DIRECTED PER COUMADIN CLINIC 02/02/15   Evans Lance, MD   BP 109/64 mmHg  Temp(Src) 97.8 F (36.6 C) (Oral)  Resp 18  Ht 6\' 2"  (1.88 m)  Wt 283 lb (128.368 kg)  BMI 36.32 kg/m2  SpO2 99%   Physical Exam  General: Well-developed, well-nourished male in no acute distress; appearance consistent with age of record HENT: normocephalic; atraumatic Eyes: pupils equal, round and reactive to light; extraocular muscles intact Neck: supple Heart: Irregular rhythm Lungs: clear to auscultation bilaterally Abdomen: soft; nondistended; equivocal lower abdominal tenderness; no masses or hepatosplenomegaly; bowel sounds present Rectal: Normal sphincter tone; no stool in vault Extremities: No deformity; full range of motion; pulses normal Neurologic: Awake, alert and oriented; motor function intact in all extremities and symmetric; no facial droop Skin: Warm and dry Psychiatric: Normal mood and affect    ED Course  Procedures (including critical care time)   MDM  Nursing notes and vitals signs, including pulse oximetry, reviewed.  Summary of this visit's results, reviewed by myself:  Labs:  Results for orders placed or performed during the hospital encounter of 05/01/15 (from the past 24 hour(s))  CBC with Differential     Status: Abnormal   Collection Time: 05/01/15  6:10 AM  Result Value Ref Range   WBC 7.1 4.0 - 10.5 K/uL   RBC 3.59 (L) 4.22 - 5.81 MIL/uL   Hemoglobin 10.8 (L) 13.0 - 17.0 g/dL   HCT 32.4 (L) 39.0 - 52.0 %   MCV 90.3 78.0 - 100.0 fL   MCH 30.1 26.0 - 34.0 pg    MCHC 33.3 30.0 - 36.0 g/dL   RDW 16.2 (H) 11.5 - 15.5 %   Platelets 162 150 - 400 K/uL   Neutrophils Relative % 77 %   Neutro Abs 5.6 1.7 - 7.7 K/uL   Lymphocytes Relative 13 %   Lymphs Abs 0.9 0.7 - 4.0 K/uL   Monocytes Relative 8 %   Monocytes Absolute 0.5 0.1 - 1.0 K/uL   Eosinophils Relative 1 %   Eosinophils Absolute 0.0 0.0 - 0.7 K/uL   Basophils Relative 1 %   Basophils Absolute 0.0 0.0 - 0.1 K/uL  Basic metabolic panel     Status: Abnormal   Collection  Time: 05/01/15  6:10 AM  Result Value Ref Range   Sodium 133 (L) 135 - 145 mmol/L   Potassium 3.9 3.5 - 5.1 mmol/L   Chloride 98 (L) 101 - 111 mmol/L   CO2 25 22 - 32 mmol/L   Glucose, Bld 149 (H) 65 - 99 mg/dL   BUN 88 (H) 6 - 20 mg/dL   Creatinine, Ser 2.91 (H) 0.61 - 1.24 mg/dL   Calcium 9.4 8.9 - 10.3 mg/dL   GFR calc non Af Amer 20 (L) >60 mL/min   GFR calc Af Amer 23 (L) >60 mL/min   Anion gap 10 5 - 15  Protime-INR     Status: Abnormal   Collection Time: 05/01/15  6:10 AM  Result Value Ref Range   Prothrombin Time 29.3 (H) 11.6 - 15.2 seconds   INR 2.83 (H) 0.00 - 1.49   6:36 AM CT abd/pelvis pending.    Shanon Rosser, MD 05/01/15 [phone]

## 2015-05-01 NOTE — ED Notes (Signed)
C/o lower abd pain x 4 hours, states last bm was 3 days ago

## 2015-05-01 NOTE — Telephone Encounter (Signed)
Left msg to schedule ER follow up

## 2015-05-05 ENCOUNTER — Ambulatory Visit (INDEPENDENT_AMBULATORY_CARE_PROVIDER_SITE_OTHER): Payer: Medicare Other | Admitting: Family

## 2015-05-05 ENCOUNTER — Encounter: Payer: Self-pay | Admitting: Family

## 2015-05-05 ENCOUNTER — Telehealth: Payer: Self-pay | Admitting: *Deleted

## 2015-05-05 VITALS — BP 132/62 | HR 47 | Temp 98.3°F | Ht 74.0 in | Wt 288.6 lb

## 2015-05-05 DIAGNOSIS — R109 Unspecified abdominal pain: Secondary | ICD-10-CM

## 2015-05-05 DIAGNOSIS — M1611 Unilateral primary osteoarthritis, right hip: Secondary | ICD-10-CM

## 2015-05-05 DIAGNOSIS — R300 Dysuria: Secondary | ICD-10-CM | POA: Diagnosis not present

## 2015-05-05 DIAGNOSIS — N289 Disorder of kidney and ureter, unspecified: Secondary | ICD-10-CM

## 2015-05-05 DIAGNOSIS — Z23 Encounter for immunization: Secondary | ICD-10-CM | POA: Diagnosis not present

## 2015-05-05 LAB — BASIC METABOLIC PANEL
BUN: 94 mg/dL (ref 6–23)
CALCIUM: 10.4 mg/dL (ref 8.4–10.5)
CO2: 32 meq/L (ref 19–32)
CREATININE: 2.46 mg/dL — AB (ref 0.40–1.50)
Chloride: 99 mEq/L (ref 96–112)
GFR: 33.43 mL/min — ABNORMAL LOW (ref >60.00)
GLUCOSE: 139 mg/dL — AB (ref 70–99)
Potassium: 3.3 mEq/L — ABNORMAL LOW (ref 3.5–5.1)
SODIUM: 139 meq/L (ref 135–145)

## 2015-05-05 LAB — URINALYSIS, ROUTINE W REFLEX MICROSCOPIC
Bilirubin Urine: NEGATIVE
KETONES UR: NEGATIVE
LEUKOCYTES UA: NEGATIVE
NITRITE: NEGATIVE
SPECIFIC GRAVITY, URINE: 1.02 (ref 1.000–1.030)
Total Protein, Urine: 100 — AB
URINE GLUCOSE: NEGATIVE
UROBILINOGEN UA: 0.2 (ref 0.0–1.0)
pH: 7 (ref 5.0–8.0)

## 2015-05-05 MED ORDER — CEPHALEXIN 500 MG PO CAPS: 500.0000 mg | ORAL_CAPSULE | Freq: Two times a day (BID) | ORAL | Status: DC

## 2015-05-05 NOTE — Telephone Encounter (Signed)
Pt called back with questions regarding below. Please call first thing in the morning at [phone].

## 2015-05-05 NOTE — Progress Notes (Signed)
Pre visit review using our clinic review tool, if applicable. No additional management support is needed unless otherwise documented below in the visit note. 

## 2015-05-05 NOTE — Patient Instructions (Addendum)
Please complete lab work prior to leaving.   

## 2015-05-05 NOTE — Telephone Encounter (Signed)
Informed patient of the provider's recommendations below. Patient voiced understanding. Scheduled follow-up appointment for 05/08/15 at 10:45 AM.

## 2015-05-05 NOTE — Progress Notes (Signed)
Subjective:    Patient ID: Bruce Cole, male    DOB: 02-24-1941, 74 y.o.   MRN: [id number]  HPI  Bruce Cole is a 74 yr old male who presents today for ED follow up. ED note is reviewed. Pt presented to the ED on 10/14 with chief complaint of abdominal pain.  He was noted to have mild anemia (hgg 10.8- unchanged from baseline), mild hyponatremia/hypochloremia, elevated creatinine 2.91 (slightly elevated from baseline of 2.0), therapeutic INR. CT abd/pelvis was performed which noted chronic non-specific retroperitoneal soft tissue stranding without acute findings.   Reports mild nausea.  Has been moving his bowels normally.  Reports mild dysuria today. Mild abdominal soreness. Denies fever.  Mild dysuria.   Review of Systems See HPI   Past Medical History  Diagnosis Date  . Nephrolithiasis   . Arthritis     rheumatoid  . Colon polyp 2000  . Atrial fibrillation ([place])   . Unspecified essential hypertension   . Gout     Social History   Social History  . Marital Status: Single    Spouse Name: N/A  . Number of Children: 3  . Years of Education: N/A   Occupational History  .     Social History Main Topics  . Smoking status: Never Smoker   . Smokeless tobacco: Never Used  . Alcohol Use: Yes     Comment: occasional  . Drug Use: No  . Sexual Activity: Not on file   Other Topics Concern  . 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 Spine surgery      x 2  . Joint replacement      Total L-Hip replacement, Right Knee 10/20/09  . Cholecystectomy  1994  . Left and right heart catheterization with coronary angiogram N/A 02/22/2013    Procedure: LEFT AND RIGHT HEART CATHETERIZATION WITH CORONARY ANGIOGRAM;  Surgeon: Jolaine Artist, MD;  Location: [facility] CATH LAB;  Service: Cardiovascular;  Laterality: N/A;    Family History  Problem Relation Age of Onset  . Hypertension  Mother   . Arthritis Mother     ?RA  . Hypertension Father     Allergies  Allergen Reactions  . Ace Inhibitors     Worsening renal insufficiency    Current Outpatient Prescriptions on File Prior to Visit  Medication Sig Dispense Refill  . allopurinol (ZYLOPRIM) 100 MG tablet TAKE 2 TABLETS BY MOUTH EVERY DAY 60 tablet 3  . amLODipine (NORVASC) 10 MG tablet TAKE 1 TABLET BY MOUTH EVERY DAY 30 tablet 5  . carvedilol (COREG) 12.5 MG tablet TAKE 1 TABLET BY MOUTH EVERY MORNING AND TAKE 1/2 TABLET BY MOUTH IN THE EVENING 45 tablet 3  . colchicine 0.6 MG tablet TAKE 2 TABLETS BY MOUTH AT THE START OF A GOUT FLARE AND TAKE 1 TABLET 3 HOURS LATER; MAX OF 3 TABLETS PER GOUT ATTACK 30 tablet 0  . fluticasone (FLONASE) 50 MCG/ACT nasal spray Place 2 sprays into both nostrils daily. 16 g 6  . furosemide (LASIX) 40 MG tablet Take 80mg  (2 tablets) AM and 40mg  (1tablet) PM    . glucose blood test strip Use as to check blood sugar 1 to 2 times a day.  DX  E11.8    . hydrALAZINE (APRESOLINE) 50 MG tablet TAKE 1 TABLET BY MOUTH THREE TIMES DAILY 90 tablet 2  . HYDROcodone-acetaminophen (  NORCO/VICODIN) 5-325 MG tablet Take 1-2 tablets by mouth every 6 (six) hours as needed for moderate pain. 20 tablet 0  . hydroxychloroquine (PLAQUENIL) 200 MG tablet Take 200 mg by mouth 2 (two) times daily.    . isosorbide mononitrate (IMDUR) 30 MG 24 hr tablet Take 1 tablet (30 mg total) by mouth daily. 30 tablet 6  . isosorbide mononitrate (IMDUR) 30 MG 24 hr tablet TAKE 1 TABLET BY MOUTH DAILY 30 tablet 0  . metolazone (ZAROXOLYN) 2.5 MG tablet TAKE 1 TABLET BY MOUTH 30 MINUTES PRIOR TO YOUR MORNING FLUID PILL DOSE 2 DAYS PER WEEK 10 tablet 3  . Multiple Vitamins-Minerals (PX MENS MULTIVITAMINS) TABS Take 1 tablet by mouth daily.    . ondansetron (ZOFRAN ODT) 4 MG disintegrating tablet Take 1 tablet (4 mg total) by mouth every 8 (eight) hours as needed for nausea or vomiting. 12 tablet 1  . ondansetron (ZOFRAN) 4 MG  tablet TAKE 1 TABLET BY MOUTH EVERY 6 HOURS AS NEEDED FOR NAUSEA 20 tablet 0  . potassium chloride SA (K-DUR,KLOR-CON) 20 MEQ tablet TAKE 1 TABLET BY MOUTH THREE TIMES DAILY 90 tablet 0  . sildenafil (VIAGRA) 100 MG tablet Take 100 mg by mouth daily as needed for erectile dysfunction.     [name] spironolactone (ALDACTONE) 25 MG tablet TAKE 1 TABLET BY MOUTH DAILY 30 tablet 0  . terazosin (HYTRIN) 1 MG capsule TAKE 1 CAPSULE BY MOUTH EVERY NIGHT AT BEDTIME 30 capsule 5  . traMADol (ULTRAM) 50 MG tablet Take 50 mg by mouth every 6 (six) hours as needed.    . valACYclovir (VALTREX) 500 MG tablet Take 1 tablet (500 mg total) by mouth daily. 30 tablet 5  . warfarin (COUMADIN) 7.5 MG tablet TAKE 1 TABLET BY MOUTH EVERY DAILY AS DIRECTED PER COUMADIN CLINIC 40 tablet 3   No current facility-administered medications on file prior to visit.    BP 132/62 mmHg  Pulse 47  Temp(Src) 98.3 F (36.8 C) (Oral)  Ht 6\' 2"  (1.88 m)  Wt 288 lb 9.6 oz (130.908 kg)  BMI 37.04 kg/m2  SpO2 100%       Objective:   Physical Exam  Constitutional: He is oriented to person, place, and time. He appears well-developed and well-nourished. No distress.  HENT:  Head: Normocephalic and atraumatic.  Cardiovascular: Normal rate and regular rhythm.   No murmur heard. Pulmonary/Chest: Effort normal and breath sounds normal. No respiratory distress. He has no wheezes. He has no rales.  Abdominal: Soft. He exhibits no distension.  Mild lower abdominal tenderness. No rebound, no guarding  Musculoskeletal: He exhibits no edema.  Neurological: He is alert and oriented to person, place, and time.  Skin: Skin is warm and dry.  Psychiatric: He has a normal mood and affect. His behavior is normal. Thought content normal.          Assessment & Plan:  Abdominal pain- nearly resolved.  Monitor.  Renal insufficiency- suspect pt was dehydrated. Will obtain follow up bmet.   Hyponatremia- repeat sodium.    Mild Dysuria-  obtain UA and Culture

## 2015-05-05 NOTE — Telephone Encounter (Signed)
Continue potassium today tid, then stop potassium as well.

## 2015-05-05 NOTE — Telephone Encounter (Signed)
Case and lab results reviewed with pt's nephrologist- Dr. [name]. Due to uremia and worsening renal function he recommends d/c lasix and aldactone, have patient return on Friday.   Please contact pt and give him above instructions,  And schedule him to see me back on Friday.

## 2015-05-05 NOTE — Telephone Encounter (Signed)
[facility] lab called to report critical -- BUN [id number]

## 2015-05-06 LAB — URINE CULTURE
COLONY COUNT: NO GROWTH
Organism ID, Bacteria: NO GROWTH

## 2015-05-06 NOTE — Telephone Encounter (Signed)
Patient voiced that he needed clarification about which medications to stop taking at this time. Advised patient of the below.  Case and lab results reviewed with pt's nephrologist- Dr. [name]. Due to uremia and worsening renal function he recommends d/c lasix and aldactone, have patient return on Friday.   And also made aware to stop the potassium.  Patient understood and did not have any further questions.

## 2015-05-07 DIAGNOSIS — M25551 Pain in right hip: Secondary | ICD-10-CM | POA: Diagnosis not present

## 2015-05-07 DIAGNOSIS — M1611 Unilateral primary osteoarthritis, right hip: Secondary | ICD-10-CM | POA: Diagnosis not present

## 2015-05-08 ENCOUNTER — Encounter: Payer: Self-pay | Admitting: Family

## 2015-05-08 ENCOUNTER — Ambulatory Visit (INDEPENDENT_AMBULATORY_CARE_PROVIDER_SITE_OTHER): Payer: Medicare Other | Admitting: Family

## 2015-05-08 VITALS — BP 120/68 | HR 56 | Temp 98.3°F | Ht 75.0 in | Wt 284.1 lb

## 2015-05-08 DIAGNOSIS — N289 Disorder of kidney and ureter, unspecified: Secondary | ICD-10-CM

## 2015-05-08 LAB — BASIC METABOLIC PANEL
BUN: 65 mg/dL — AB (ref 6–23)
CALCIUM: 10.6 mg/dL — AB (ref 8.4–10.5)
CO2: 33 mEq/L — ABNORMAL HIGH (ref 19–32)
CREATININE: 1.99 mg/dL — AB (ref 0.40–1.50)
Chloride: 102 mEq/L (ref 96–112)
GFR: 42.7 mL/min — AB (ref >60.00)
Glucose, Bld: 145 mg/dL — ABNORMAL HIGH (ref 70–99)
POTASSIUM: 3.7 meq/L (ref 3.5–5.1)
Sodium: 142 mEq/L (ref 135–145)

## 2015-05-08 NOTE — Progress Notes (Signed)
Subjective:    Patient ID: Bruce Cole, male    DOB: 1941/01/05, 74 y.o.   MRN: [id number]  HPI  Bruce Cole is a 74 yr old male who presents today for follow up.    Last visit he was azotemic with some nausea. BUN/Cr up.  His diuretics were held.  Pt misunderstood and stopped allopurinol but continued aldactone.  He did stop lasix.    Wt Readings from Last 3 Encounters:  05/08/15 284 lb 2 oz (128.878 kg)  05/05/15 288 lb 9.6 oz (130.908 kg)  05/01/15 283 lb (128.368 kg)   Reports nausea is improved.    Review of Systems See HPI  Past Medical History  Diagnosis Date  . Nephrolithiasis   . Arthritis     rheumatoid  . Colon polyp 2000  . Atrial fibrillation ([place])   . Unspecified essential hypertension   . Gout     Social History   Social History  . Marital Status: Single    Spouse Name: N/A  . Number of Children: 3  . Years of Education: N/A   Occupational History  .     Social History Main Topics  . Smoking status: Never Smoker   . Smokeless tobacco: Never Used  . Alcohol Use: Yes     Comment: occasional  . Drug Use: No  . Sexual Activity: Not on file   Other Topics Concern  . 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 Spine surgery      x 2  . Joint replacement      Total L-Hip replacement, Right Knee 10/20/09  . Cholecystectomy  1994  . Left and right heart catheterization with coronary angiogram N/A 02/22/2013    Procedure: LEFT AND RIGHT HEART CATHETERIZATION WITH CORONARY ANGIOGRAM;  Surgeon: Jolaine Artist, MD;  Location: [facility] CATH LAB;  Service: Cardiovascular;  Laterality: N/A;    Family History  Problem Relation Age of Onset  . Hypertension Mother   . Arthritis Mother     ?RA  . Hypertension Father     Allergies  Allergen Reactions  . Ace Inhibitors     Worsening renal insufficiency    Current Outpatient Prescriptions on File  Prior to Visit  Medication Sig Dispense Refill  . allopurinol (ZYLOPRIM) 100 MG tablet TAKE 2 TABLETS BY MOUTH EVERY DAY 60 tablet 3  . amLODipine (NORVASC) 10 MG tablet TAKE 1 TABLET BY MOUTH EVERY DAY 30 tablet 5  . carvedilol (COREG) 12.5 MG tablet TAKE 1 TABLET BY MOUTH EVERY MORNING AND TAKE 1/2 TABLET BY MOUTH IN THE EVENING 45 tablet 3  . cephALEXin (KEFLEX) 500 MG capsule Take 1 capsule (500 mg total) by mouth 2 (two) times daily. 14 capsule 0  . colchicine 0.6 MG tablet TAKE 2 TABLETS BY MOUTH AT THE START OF A GOUT FLARE AND TAKE 1 TABLET 3 HOURS LATER; MAX OF 3 TABLETS PER GOUT ATTACK 30 tablet 0  . fluticasone (FLONASE) 50 MCG/ACT nasal spray Place 2 sprays into both nostrils daily. 16 g 6  . furosemide (LASIX) 40 MG tablet Take 80mg  (2 tablets) AM and 40mg  (1tablet) PM    . glucose blood test strip Use as to check blood sugar 1 to 2 times a day.  DX  E11.8    . hydrALAZINE (APRESOLINE) 50 MG tablet TAKE 1 TABLET BY MOUTH THREE TIMES  DAILY 90 tablet 2  . HYDROcodone-acetaminophen (NORCO/VICODIN) 5-325 MG tablet Take 1-2 tablets by mouth every 6 (six) hours as needed for moderate pain. 20 tablet 0  . hydroxychloroquine (PLAQUENIL) 200 MG tablet Take 200 mg by mouth 2 (two) times daily.    . isosorbide mononitrate (IMDUR) 30 MG 24 hr tablet Take 1 tablet (30 mg total) by mouth daily. 30 tablet 6  . isosorbide mononitrate (IMDUR) 30 MG 24 hr tablet TAKE 1 TABLET BY MOUTH DAILY 30 tablet 0  . metolazone (ZAROXOLYN) 2.5 MG tablet TAKE 1 TABLET BY MOUTH 30 MINUTES PRIOR TO YOUR MORNING FLUID PILL DOSE 2 DAYS PER WEEK 10 tablet 3  . Multiple Vitamins-Minerals (PX MENS MULTIVITAMINS) TABS Take 1 tablet by mouth daily.    . ondansetron (ZOFRAN ODT) 4 MG disintegrating tablet Take 1 tablet (4 mg total) by mouth every 8 (eight) hours as needed for nausea or vomiting. 12 tablet 1  . ondansetron (ZOFRAN) 4 MG tablet TAKE 1 TABLET BY MOUTH EVERY 6 HOURS AS NEEDED FOR NAUSEA 20 tablet 0  . potassium  chloride SA (K-DUR,KLOR-CON) 20 MEQ tablet TAKE 1 TABLET BY MOUTH THREE TIMES DAILY 90 tablet 0  . promethazine (PHENERGAN) 25 MG tablet Take 1 tablet by mouth as needed.  1  . sildenafil (VIAGRA) 100 MG tablet Take 100 mg by mouth daily as needed for erectile dysfunction.     [name] spironolactone (ALDACTONE) 25 MG tablet TAKE 1 TABLET BY MOUTH DAILY 30 tablet 0  . terazosin (HYTRIN) 1 MG capsule TAKE 1 CAPSULE BY MOUTH EVERY NIGHT AT BEDTIME 30 capsule 5  . traMADol (ULTRAM) 50 MG tablet Take 50 mg by mouth every 6 (six) hours as needed.    . valACYclovir (VALTREX) 500 MG tablet Take 1 tablet (500 mg total) by mouth daily. 30 tablet 5  . warfarin (COUMADIN) 7.5 MG tablet TAKE 1 TABLET BY MOUTH EVERY DAILY AS DIRECTED PER COUMADIN CLINIC 40 tablet 3   No current facility-administered medications on file prior to visit.    BP 120/68 mmHg  Pulse 56  Temp(Src) 98.3 F (36.8 C) (Oral)  Ht 6\' 3"  (1.905 m)  Wt 284 lb 2 oz (128.878 kg)  BMI 35.51 kg/m2  SpO2 96%       Objective:   Physical Exam  Constitutional: He is oriented to person, place, and time. He appears well-developed and well-nourished. No distress.  HENT:  Head: Normocephalic and atraumatic.  Cardiovascular: Normal rate and regular rhythm.   No murmur heard. Pulmonary/Chest: Effort normal and breath sounds normal. No respiratory distress. He has no wheezes. He has no rales.  Musculoskeletal: He exhibits no edema.  Neurological: He is alert and oriented to person, place, and time.  Skin: Skin is warm and dry.  Psychiatric: He has a normal mood and affect. His behavior is normal. Thought content normal.          Assessment & Plan:

## 2015-05-08 NOTE — Progress Notes (Signed)
Pre visit review using our clinic review tool, if applicable. No additional management support is needed unless otherwise documented below in the visit note. 

## 2015-05-08 NOTE — Assessment & Plan Note (Signed)
Worsening azotemia last visit.  Nausea is improved. Weight is actually down which is will obtain follow up bmet. Advised pt: Remain off of lasix and off of aldactone (spironolactone). Restart allopurinol for gout.   Weigh daily. Call me if you gain more than 3 pounds. Keep upcoming appointment with Dr. [name].  Pt verbalizes understanding.

## 2015-05-08 NOTE — Patient Instructions (Addendum)
Remain off of lasix and off of aldactone (spironolactone). Restart allopurinol for gout.   Weigh daily. Call me if you gain more than 3 pounds. Schedule surgical clearance visit at your convenience. (should be done within 30 days of planned surgery) Keep upcoming appointment with Dr. [name].

## 2015-05-11 ENCOUNTER — Telehealth: Payer: Self-pay | Admitting: Family

## 2015-05-11 DIAGNOSIS — N289 Disorder of kidney and ureter, unspecified: Secondary | ICD-10-CM

## 2015-05-11 NOTE — Telephone Encounter (Signed)
Restart lasix 80mg  bid, follow up in 1 week for bmet (renal insufficiency) and weight in office.  Call us if he has further weight gain after restarting lasix or if he develops nausea.

## 2015-05-11 NOTE — Telephone Encounter (Signed)
Notified pt and he voices understanding. Scheduled nurse visit for 05/19/15 at 9:30am then pt will complete lab test. Order entered.

## 2015-05-11 NOTE — Telephone Encounter (Signed)
Best # [phone]  Pt called to notify he stopped the water pill and gained 4 lbs over the weekend.

## 2015-05-15 ENCOUNTER — Other Ambulatory Visit (HOSPITAL_COMMUNITY): Payer: Self-pay | Admitting: Internal Medicine

## 2015-05-15 ENCOUNTER — Other Ambulatory Visit: Payer: Self-pay | Admitting: Family

## 2015-05-19 ENCOUNTER — Telehealth: Payer: Self-pay | Admitting: Family

## 2015-05-19 ENCOUNTER — Ambulatory Visit (INDEPENDENT_AMBULATORY_CARE_PROVIDER_SITE_OTHER): Payer: Medicare Other | Admitting: Family

## 2015-05-19 ENCOUNTER — Encounter: Payer: Self-pay | Admitting: Gastroenterology

## 2015-05-19 VITALS — BP 122/70 | Wt 296.2 lb

## 2015-05-19 DIAGNOSIS — N289 Disorder of kidney and ureter, unspecified: Secondary | ICD-10-CM

## 2015-05-19 DIAGNOSIS — I5042 Chronic combined systolic (congestive) and diastolic (congestive) heart failure: Secondary | ICD-10-CM | POA: Diagnosis not present

## 2015-05-19 LAB — BASIC METABOLIC PANEL
BUN: 78 mg/dL — ABNORMAL HIGH (ref 6–23)
CALCIUM: 9.7 mg/dL (ref 8.4–10.5)
CO2: 30 mEq/L (ref 19–32)
Chloride: 100 mEq/L (ref 96–112)
Creatinine, Ser: 2.63 mg/dL — ABNORMAL HIGH (ref 0.40–1.50)
GFR: 30.94 mL/min — AB (ref >60.00)
Glucose, Bld: 132 mg/dL — ABNORMAL HIGH (ref 70–99)
POTASSIUM: 3.6 meq/L (ref 3.5–5.1)
SODIUM: 139 meq/L (ref 135–145)

## 2015-05-19 NOTE — Assessment & Plan Note (Signed)
Patient's creatinine is up a bit at 2.6, was 1.99 off of diuretics.  However he is not tolerating remaining off of diuretics.  Advised pt continue current dose of lasix and keep upcoming appointment with Dr. [name]- his nephrologist next week.  Pt verbalizes understanding.

## 2015-05-19 NOTE — Assessment & Plan Note (Signed)
Weight is up, but trending down per pt.  Continue current dose of lasix.

## 2015-05-19 NOTE — Progress Notes (Signed)
Subjective:    Patient ID: Bruce Cole, male    DOB: 07-24-1940, 74 y.o.   MRN: [name]  HPI  Mr. dillie presents today for follow up of his weight.  The patient reports that his weight went as high as 300, then it has trended down. Continues lasix 80mg  bid.  Has some mild SOB. Mild nausea and lower abdominal cramping but no dysuria or urinary problems.  Still has some mild nausea.  Wt Readings from Last 3 Encounters:  05/19/15 296 lb 3.2 oz (134.355 kg)  05/08/15 284 lb 2 oz (128.878 kg)  05/05/15 288 lb 9.6 oz (130.908 kg)     Review of Systems See HPI  Past Medical History  Diagnosis Date  . Nephrolithiasis   . Arthritis     rheumatoid  . Colon polyp 2000  . Atrial fibrillation ([place])   . Unspecified essential hypertension   . Gout     Social History   Social History  . Marital Status: Single    Spouse Name: N/A  . Number of Children: 3  . Years of Education: N/A   Occupational History  .     Social History Main Topics  . Smoking status: Never Smoker   . Smokeless tobacco: Never Used  . Alcohol Use: Yes     Comment: occasional  . Drug Use: No  . Sexual Activity: Not on file   Other Topics Concern  . 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 Spine surgery      x 2  . Joint replacement      Total L-Hip replacement, Right Knee 10/20/09  . Cholecystectomy  1994  . Left and right heart catheterization with coronary angiogram N/A 02/22/2013    Procedure: LEFT AND RIGHT HEART CATHETERIZATION WITH CORONARY ANGIOGRAM;  Surgeon: Jolaine Artist, MD;  Location: [facility] CATH LAB;  Service: Cardiovascular;  Laterality: N/A;    Family History  Problem Relation Age of Onset  . Hypertension Mother   . Arthritis Mother     ?RA  . Hypertension Father     Allergies  Allergen Reactions  . Ace Inhibitors     Worsening renal insufficiency    Current  Outpatient Prescriptions on File Prior to Visit  Medication Sig Dispense Refill  . allopurinol (ZYLOPRIM) 100 MG tablet TAKE 2 TABLETS BY MOUTH EVERY DAY 60 tablet 3  . amLODipine (NORVASC) 10 MG tablet TAKE 1 TABLET BY MOUTH EVERY DAY 30 tablet 5  . carvedilol (COREG) 12.5 MG tablet TAKE 1 TABLET BY MOUTH EVERY MORNING AND TAKE 1/2 TABLET BY MOUTH IN THE EVENING 45 tablet 3  . cephALEXin (KEFLEX) 500 MG capsule Take 1 capsule (500 mg total) by mouth 2 (two) times daily. 14 capsule 0  . colchicine 0.6 MG tablet TAKE 2 TABLETS BY MOUTH AT THE START OF A GOUT FLARE AND TAKE 1 TABLET 3 HOURS LATER; MAX OF 3 TABLETS PER GOUT ATTACK 30 tablet 0  . fluticasone (FLONASE) 50 MCG/ACT nasal spray Place 2 sprays into both nostrils daily. 16 g 6  . furosemide (LASIX) 40 MG tablet TAKE 2 TABLETS BY MOUTH TWICE DAILY 120 tablet 5  . glucose blood test strip Use as to check blood sugar 1 to 2 times a day.  DX  E11.8    . hydrALAZINE (APRESOLINE) 50 MG tablet TAKE 1 TABLET BY MOUTH  THREE TIMES DAILY 90 tablet 2  . HYDROcodone-acetaminophen (NORCO/VICODIN) 5-325 MG tablet Take 1-2 tablets by mouth every 6 (six) hours as needed for moderate pain. 20 tablet 0  . hydroxychloroquine (PLAQUENIL) 200 MG tablet Take 200 mg by mouth 2 (two) times daily.    . isosorbide mononitrate (IMDUR) 30 MG 24 hr tablet Take 1 tablet (30 mg total) by mouth daily. 30 tablet 6  . isosorbide mononitrate (IMDUR) 30 MG 24 hr tablet TAKE 1 TABLET BY MOUTH DAILY 30 tablet 0  . metolazone (ZAROXOLYN) 2.5 MG tablet TAKE 1 TABLET BY MOUTH 30 MINUTES PRIOR TO YOUR MORNING FLUID PILL DOSE 2 DAYS PER WEEK 10 tablet 3  . Multiple Vitamins-Minerals (PX MENS MULTIVITAMINS) TABS Take 1 tablet by mouth daily.    . ondansetron (ZOFRAN ODT) 4 MG disintegrating tablet Take 1 tablet (4 mg total) by mouth every 8 (eight) hours as needed for nausea or vomiting. 12 tablet 1  . ondansetron (ZOFRAN) 4 MG tablet TAKE 1 TABLET BY MOUTH EVERY 6 HOURS AS NEEDED FOR  NAUSEA 20 tablet 0  . potassium chloride SA (K-DUR,KLOR-CON) 20 MEQ tablet TAKE 1 TABLET BY MOUTH THREE TIMES DAILY 90 tablet 0  . promethazine (PHENERGAN) 25 MG tablet Take 1 tablet by mouth as needed.  1  . sildenafil (VIAGRA) 100 MG tablet Take 100 mg by mouth daily as needed for erectile dysfunction.     [name] spironolactone (ALDACTONE) 25 MG tablet TAKE 1 TABLET BY MOUTH DAILY 30 tablet 0  . terazosin (HYTRIN) 1 MG capsule TAKE 1 CAPSULE BY MOUTH EVERY NIGHT AT BEDTIME 30 capsule 5  . traMADol (ULTRAM) 50 MG tablet Take 50 mg by mouth every 6 (six) hours as needed.    . valACYclovir (VALTREX) 500 MG tablet Take 1 tablet (500 mg total) by mouth daily. 30 tablet 5  . warfarin (COUMADIN) 7.5 MG tablet TAKE 1 TABLET BY MOUTH EVERY DAILY AS DIRECTED PER COUMADIN CLINIC 40 tablet 3   No current facility-administered medications on file prior to visit.    BP 122/70 mmHg  Wt 296 lb 3.2 oz (134.355 kg)       Objective:   Physical Exam  Constitutional: He is oriented to person, place, and time. He appears well-developed and well-nourished. No distress.  Cardiovascular: Normal rate and regular rhythm.   No murmur heard. Pulmonary/Chest: Effort normal and breath sounds normal. No respiratory distress. He has no wheezes. He has no rales. He exhibits no tenderness.  Musculoskeletal:  1+ bilateral LE edema  Neurological: He is alert and oriented to person, place, and time.  Psychiatric: He has a normal mood and affect. His behavior is normal. Judgment and thought content normal.          Assessment & Plan:

## 2015-05-19 NOTE — Progress Notes (Signed)
Pre visit review using our clinic review tool, if applicable. No additional management support is needed unless otherwise documented below in the visit note.  Patient in for weight check and labs

## 2015-05-19 NOTE — Telephone Encounter (Signed)
Opened in error

## 2015-05-23 ENCOUNTER — Other Ambulatory Visit: Payer: Self-pay | Admitting: Family

## 2015-05-25 NOTE — Telephone Encounter (Signed)
Medication Detail      Disp Refills Start End     colchicine 0.6 MG tablet 30 tablet 0 01/05/2015     Sig: TAKE 2 TABLETS BY MOUTH AT THE START OF A GOUT FLARE AND TAKE 1 TABLET 3 HOURS LATER; MAX OF 3 TABLETS PER GOUT ATTACK    E-Prescribing Status: Receipt confirmed by pharmacy (01/05/2015 2:27 PM EDT)     [facility] [id number] - [place], [place] - [address] AT [facility] OF [address]   LAST OV: 05/19/15 Refill sent per [facility] refill protocol/SLS

## 2015-05-26 DIAGNOSIS — I1310 Hypertensive heart and chronic kidney disease without heart failure, with stage 1 through stage 4 chronic kidney disease, or unspecified chronic kidney disease: Secondary | ICD-10-CM | POA: Diagnosis not present

## 2015-05-26 DIAGNOSIS — I10 Essential (primary) hypertension: Secondary | ICD-10-CM | POA: Diagnosis not present

## 2015-05-26 DIAGNOSIS — N183 Chronic kidney disease, stage 3 (moderate): Secondary | ICD-10-CM | POA: Diagnosis not present

## 2015-06-09 ENCOUNTER — Ambulatory Visit (INDEPENDENT_AMBULATORY_CARE_PROVIDER_SITE_OTHER): Payer: Medicare Other | Admitting: *Deleted

## 2015-06-09 DIAGNOSIS — Z5181 Encounter for therapeutic drug level monitoring: Secondary | ICD-10-CM

## 2015-06-09 DIAGNOSIS — I4891 Unspecified atrial fibrillation: Secondary | ICD-10-CM

## 2015-06-09 DIAGNOSIS — I482 Chronic atrial fibrillation, unspecified: Secondary | ICD-10-CM

## 2015-06-09 DIAGNOSIS — Z7901 Long term (current) use of anticoagulants: Secondary | ICD-10-CM | POA: Diagnosis not present

## 2015-06-09 LAB — POCT INR: INR: 2.2

## 2015-06-10 ENCOUNTER — Ambulatory Visit: Payer: Medicare Other | Admitting: Family

## 2015-06-14 ENCOUNTER — Other Ambulatory Visit: Payer: Self-pay | Admitting: Family

## 2015-06-15 ENCOUNTER — Other Ambulatory Visit (HOSPITAL_COMMUNITY): Payer: Self-pay | Admitting: Internal Medicine

## 2015-06-16 NOTE — Telephone Encounter (Signed)
Refill sent per [facility] refill protocol/SLS  

## 2015-07-03 ENCOUNTER — Other Ambulatory Visit (HOSPITAL_COMMUNITY): Payer: Self-pay | Admitting: Internal Medicine

## 2015-07-16 DIAGNOSIS — I739 Peripheral vascular disease, unspecified: Secondary | ICD-10-CM | POA: Diagnosis not present

## 2015-07-16 DIAGNOSIS — L603 Nail dystrophy: Secondary | ICD-10-CM | POA: Diagnosis not present

## 2015-07-17 ENCOUNTER — Other Ambulatory Visit (HOSPITAL_COMMUNITY): Payer: Self-pay | Admitting: Adult Health

## 2015-07-17 ENCOUNTER — Other Ambulatory Visit: Payer: Self-pay | Admitting: Family

## 2015-07-17 NOTE — Telephone Encounter (Signed)
Okay to refill? 

## 2015-07-17 NOTE — Telephone Encounter (Signed)
Labs reviewed by Dr. Larose Kells due to renal insuffiencey, okay per Dr. Larose Kells for Allopurinol 100 mg 1 tablet bid as prescribed. Refills sent.

## 2015-07-18 ENCOUNTER — Other Ambulatory Visit (HOSPITAL_COMMUNITY): Payer: Self-pay | Admitting: Internal Medicine

## 2015-07-21 ENCOUNTER — Ambulatory Visit (INDEPENDENT_AMBULATORY_CARE_PROVIDER_SITE_OTHER): Payer: Medicare Other | Admitting: Pharmacist

## 2015-07-21 DIAGNOSIS — Z7901 Long term (current) use of anticoagulants: Secondary | ICD-10-CM

## 2015-07-21 DIAGNOSIS — I4891 Unspecified atrial fibrillation: Secondary | ICD-10-CM | POA: Diagnosis not present

## 2015-07-21 DIAGNOSIS — Z79899 Other long term (current) drug therapy: Secondary | ICD-10-CM | POA: Diagnosis not present

## 2015-07-21 DIAGNOSIS — I482 Chronic atrial fibrillation, unspecified: Secondary | ICD-10-CM

## 2015-07-21 DIAGNOSIS — M25551 Pain in right hip: Secondary | ICD-10-CM | POA: Diagnosis not present

## 2015-07-21 DIAGNOSIS — M79641 Pain in right hand: Secondary | ICD-10-CM | POA: Diagnosis not present

## 2015-07-21 DIAGNOSIS — Z5181 Encounter for therapeutic drug level monitoring: Secondary | ICD-10-CM | POA: Diagnosis not present

## 2015-07-21 DIAGNOSIS — M0579 Rheumatoid arthritis with rheumatoid factor of multiple sites without organ or systems involvement: Secondary | ICD-10-CM | POA: Diagnosis not present

## 2015-07-21 LAB — POCT INR: INR: 2.4

## 2015-07-21 LAB — HM DIABETES EYE EXAM

## 2015-07-23 DIAGNOSIS — M0569 Rheumatoid arthritis of multiple sites with involvement of other organs and systems: Secondary | ICD-10-CM | POA: Diagnosis not present

## 2015-07-23 DIAGNOSIS — H2513 Age-related nuclear cataract, bilateral: Secondary | ICD-10-CM | POA: Diagnosis not present

## 2015-07-23 DIAGNOSIS — Z79899 Other long term (current) drug therapy: Secondary | ICD-10-CM | POA: Diagnosis not present

## 2015-07-25 ENCOUNTER — Other Ambulatory Visit (HOSPITAL_COMMUNITY): Payer: Self-pay | Admitting: Internal Medicine

## 2015-07-27 ENCOUNTER — Other Ambulatory Visit (HOSPITAL_COMMUNITY): Payer: Self-pay | Admitting: Internal Medicine

## 2015-07-28 ENCOUNTER — Other Ambulatory Visit: Payer: Self-pay | Admitting: Family

## 2015-07-31 ENCOUNTER — Other Ambulatory Visit (HOSPITAL_COMMUNITY): Payer: Self-pay | Admitting: Internal Medicine

## 2015-08-04 ENCOUNTER — Encounter: Payer: Self-pay | Admitting: Family

## 2015-08-04 ENCOUNTER — Ambulatory Visit (INDEPENDENT_AMBULATORY_CARE_PROVIDER_SITE_OTHER): Payer: Medicare Other | Admitting: Family

## 2015-08-04 VITALS — BP 124/63 | HR 54 | Temp 98.8°F | Resp 16 | Ht 75.0 in | Wt 289.0 lb

## 2015-08-04 DIAGNOSIS — E1122 Type 2 diabetes mellitus with diabetic chronic kidney disease: Secondary | ICD-10-CM

## 2015-08-04 DIAGNOSIS — N289 Disorder of kidney and ureter, unspecified: Secondary | ICD-10-CM

## 2015-08-04 DIAGNOSIS — N183 Chronic kidney disease, stage 3 unspecified: Secondary | ICD-10-CM

## 2015-08-04 DIAGNOSIS — I5042 Chronic combined systolic (congestive) and diastolic (congestive) heart failure: Secondary | ICD-10-CM | POA: Diagnosis not present

## 2015-08-04 DIAGNOSIS — E118 Type 2 diabetes mellitus with unspecified complications: Secondary | ICD-10-CM

## 2015-08-04 LAB — BASIC METABOLIC PANEL
BUN: 70 mg/dL — AB (ref 6–23)
CHLORIDE: 99 meq/L (ref 96–112)
CO2: 31 meq/L (ref 19–32)
Calcium: 10.2 mg/dL (ref 8.4–10.5)
Creatinine, Ser: 2.59 mg/dL — ABNORMAL HIGH (ref 0.40–1.50)
GFR: 31.48 mL/min — AB (ref >60.00)
GLUCOSE: 109 mg/dL — AB (ref 70–99)
POTASSIUM: 3.6 meq/L (ref 3.5–5.1)
SODIUM: 141 meq/L (ref 135–145)

## 2015-08-04 LAB — HEMOGLOBIN A1C: Hgb A1c MFr Bld: 6.7 % — ABNORMAL HIGH (ref 4.6–6.5)

## 2015-08-04 NOTE — Progress Notes (Signed)
Subjective:    Patient ID: Bruce Cole, male    DOB: 09-01-1940, 75 y.o.   MRN: [name]  HPI  CHF- currently maintained on lasix.   Wt Readings from Last 3 Encounters:  08/04/15 289 lb (131.09 kg)  05/19/15 296 lb 3.2 oz (134.355 kg)  05/08/15 284 lb 2 oz (128.878 kg)   Furosemide 2 tabs PO in AM 1 tab PO in the evening. Also on aldactone.  Mild SOB but improved from last visit.    DM2- diet is improving pot holiday.  Not checking sugars.   Lab Results  Component Value Date   HGBA1C 6.6* 03/18/2015   HGBA1C 6.4 12/16/2014   HGBA1C 6.7* 06/23/2014   Lab Results  Component Value Date   MICROALBUR 20.7* 12/16/2014   [place] 95 03/18/2015   CREATININE 2.63* 05/19/2015   Renal insufficiency- saw renal in November. Follows with Dr. [name] Review of Systems     Past Medical History  Diagnosis Date  . Nephrolithiasis   . Arthritis     rheumatoid  . Colon polyp 2000  . Atrial fibrillation ([place])   . Unspecified essential hypertension   . Gout     Social History   Social History  . Marital Status: Single    Spouse Name: N/A  . Number of Children: 3  . Years of Education: N/A   Occupational History  .     Social History Main Topics  . Smoking status: Never Smoker   . Smokeless tobacco: Never Used  . Alcohol Use: Yes     Comment: occasional  . Drug Use: No  . Sexual Activity: Not on file   Other Topics Concern  . 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 Spine surgery      x 2  . Joint replacement      Total L-Hip replacement, Right Knee 10/20/09  . Cholecystectomy  1994  . Left and right heart catheterization with coronary angiogram N/A 02/22/2013    Procedure: LEFT AND RIGHT HEART CATHETERIZATION WITH CORONARY ANGIOGRAM;  Surgeon: Jolaine Artist, MD;  Location: [facility] CATH LAB;  Service: Cardiovascular;  Laterality: N/A;    Family History  Problem  Relation Age of Onset  . Hypertension Mother   . Arthritis Mother     ?RA  . Hypertension Father     Allergies  Allergen Reactions  . Ace Inhibitors     Worsening renal insufficiency    Current Outpatient Prescriptions on File Prior to Visit  Medication Sig Dispense Refill  . allopurinol (ZYLOPRIM) 100 MG tablet TAKE 2 TABLETS BY MOUTH EVERY DAY 60 tablet 0  . amLODipine (NORVASC) 10 MG tablet TAKE 1 TABLET BY MOUTH EVERY DAY 30 tablet 5  . carvedilol (COREG) 12.5 MG tablet TAKE 1 TABLET BY MOUTH EVERY MORNING AND TAKE 1/2 TABLET BY MOUTH IN THE EVENING 45 tablet 3  . colchicine 0.6 MG tablet TAKE 2 TABLETS BY MOUTH AT THE START OF GOUT FLARE AND TAKE 1 TABLET 3 HOURS LATER; MAX 3 TABS PER GOUT ATTACK 30 tablet 0  . fluticasone (FLONASE) 50 MCG/ACT nasal spray Place 2 sprays into both nostrils daily. 16 g 6  . furosemide (LASIX) 40 MG tablet TAKE 2 TABLETS BY MOUTH TWICE DAILY 120 tablet 5  . glucose blood test strip Use as to check blood sugar 1 to 2 times a  day.  DX  E11.8    . hydrALAZINE (APRESOLINE) 50 MG tablet TAKE 1 TABLET BY MOUTH THREE TIMES DAILY 270 tablet 1  . HYDROcodone-acetaminophen (NORCO/VICODIN) 5-325 MG tablet Take 1-2 tablets by mouth every 6 (six) hours as needed for moderate pain. 20 tablet 0  . hydroxychloroquine (PLAQUENIL) 200 MG tablet Take 200 mg by mouth 2 (two) times daily.    . isosorbide mononitrate (IMDUR) 30 MG 24 hr tablet TAKE 1 TABLET BY MOUTH EVERY DAY 30 tablet 0  . metolazone (ZAROXOLYN) 2.5 MG tablet TAKE 1 TABLET BY MOUTH 30 MINUTES PRIOR TO YOUR MORNING FLUID PILL 2 DAYS PER WEEK 90 tablet 0  . Multiple Vitamins-Minerals (PX MENS MULTIVITAMINS) TABS Take 1 tablet by mouth daily.    . ondansetron (ZOFRAN ODT) 4 MG disintegrating tablet Take 1 tablet (4 mg total) by mouth every 8 (eight) hours as needed for nausea or vomiting. 12 tablet 1  . potassium chloride SA (K-DUR,KLOR-CON) 20 MEQ tablet TAKE 1 TABLET BY MOUTH THREE TIMES DAILY 270 tablet 3    . promethazine (PHENERGAN) 25 MG tablet Take 1 tablet by mouth as needed.  1  . sildenafil (VIAGRA) 100 MG tablet Take 100 mg by mouth daily as needed for erectile dysfunction.     [name] spironolactone (ALDACTONE) 25 MG tablet TAKE 1 TABLET BY MOUTH DAILY 30 tablet 3  . terazosin (HYTRIN) 1 MG capsule TAKE 1 CAPSULE BY MOUTH EVERY NIGHT AT BEDTIME 30 capsule 5  . traMADol (ULTRAM) 50 MG tablet Take 50 mg by mouth every 6 (six) hours as needed.    . valACYclovir (VALTREX) 500 MG tablet Take 1 tablet (500 mg total) by mouth daily. 30 tablet 5  . warfarin (COUMADIN) 7.5 MG tablet TAKE 1 TABLET BY MOUTH EVERY DAILY AS DIRECTED PER COUMADIN CLINIC 40 tablet 3   No current facility-administered medications on file prior to visit.    BP 124/63 mmHg  Pulse 54  Temp(Src) 98.8 F (37.1 C) (Oral)  Resp 16  Ht 6\' 3"  (1.905 m)  Wt 289 lb (131.09 kg)  BMI 36.12 kg/m2  SpO2 100%    Objective:   Physical Exam  Constitutional: He is oriented to person, place, and time. He appears well-developed and well-nourished. No distress.  HENT:  Head: Normocephalic and atraumatic.  Eyes: No scleral icterus.  Cardiovascular: Normal rate and regular rhythm.   No murmur heard. Pulmonary/Chest: Effort normal and breath sounds normal. No respiratory distress. He has no wheezes. He has no rales.  Musculoskeletal: He exhibits no edema.  1-2 + LE edema bilaterally  Neurological: He is alert and oriented to person, place, and time.  Skin: Skin is warm and dry.  Psychiatric: He has a normal mood and affect. His behavior is normal. Thought content normal.          Assessment & Plan:

## 2015-08-04 NOTE — Assessment & Plan Note (Signed)
Stable, continue current dose of aldactone and furosemide.

## 2015-08-04 NOTE — Progress Notes (Signed)
Pre visit review using our clinic review tool, if applicable. No additional management support is needed unless otherwise documented below in the visit note. 

## 2015-08-04 NOTE — Assessment & Plan Note (Signed)
Lab Results  Component Value Date   HGBA1C 6.7* 08/04/2015   A1C remains at goal. Continue diabetic diet.

## 2015-08-04 NOTE — Patient Instructions (Signed)
Please complete lab work prior to leaving.   

## 2015-08-04 NOTE — Assessment & Plan Note (Signed)
Lab Results  Component Value Date   CREATININE 2.59* 08/04/2015   Follow up creatinine is stable.  Monitor.

## 2015-08-05 DIAGNOSIS — M25551 Pain in right hip: Secondary | ICD-10-CM | POA: Diagnosis not present

## 2015-08-06 ENCOUNTER — Telehealth (HOSPITAL_COMMUNITY): Payer: Self-pay | Admitting: Vascular Surgery

## 2015-08-06 NOTE — Telephone Encounter (Signed)
This pt would like a sooner appt w/ Db, he scheduled 08/25/15

## 2015-08-06 NOTE — Telephone Encounter (Signed)
Left message to call back  

## 2015-08-12 ENCOUNTER — Emergency Department (HOSPITAL_BASED_OUTPATIENT_CLINIC_OR_DEPARTMENT_OTHER)
Admission: EM | Admit: 2015-08-12 | Discharge: 2015-08-12 | Disposition: A | Discharge status: Home / Self Care | Payer: Medicare Other | Attending: Emergency Medicine | Admitting: Emergency Medicine

## 2015-08-12 ENCOUNTER — Emergency Department (HOSPITAL_BASED_OUTPATIENT_CLINIC_OR_DEPARTMENT_OTHER): Payer: Medicare Other

## 2015-08-12 ENCOUNTER — Encounter (HOSPITAL_BASED_OUTPATIENT_CLINIC_OR_DEPARTMENT_OTHER): Payer: Self-pay

## 2015-08-12 DIAGNOSIS — Z86010 Personal history of colonic polyps: Secondary | ICD-10-CM | POA: Insufficient documentation

## 2015-08-12 DIAGNOSIS — I5032 Chronic diastolic (congestive) heart failure: Secondary | ICD-10-CM | POA: Diagnosis not present

## 2015-08-12 DIAGNOSIS — Z87442 Personal history of urinary calculi: Secondary | ICD-10-CM | POA: Diagnosis not present

## 2015-08-12 DIAGNOSIS — M069 Rheumatoid arthritis, unspecified: Secondary | ICD-10-CM | POA: Insufficient documentation

## 2015-08-12 DIAGNOSIS — R197 Diarrhea, unspecified: Secondary | ICD-10-CM | POA: Insufficient documentation

## 2015-08-12 DIAGNOSIS — Z9889 Other specified postprocedural states: Secondary | ICD-10-CM | POA: Insufficient documentation

## 2015-08-12 DIAGNOSIS — Z79899 Other long term (current) drug therapy: Secondary | ICD-10-CM | POA: Insufficient documentation

## 2015-08-12 DIAGNOSIS — M109 Gout, unspecified: Secondary | ICD-10-CM | POA: Diagnosis not present

## 2015-08-12 DIAGNOSIS — N179 Acute kidney failure, unspecified: Secondary | ICD-10-CM | POA: Insufficient documentation

## 2015-08-12 DIAGNOSIS — Z7901 Long term (current) use of anticoagulants: Secondary | ICD-10-CM | POA: Insufficient documentation

## 2015-08-12 DIAGNOSIS — R0602 Shortness of breath: Secondary | ICD-10-CM | POA: Diagnosis present

## 2015-08-12 DIAGNOSIS — R634 Abnormal weight loss: Secondary | ICD-10-CM | POA: Diagnosis not present

## 2015-08-12 DIAGNOSIS — Z7951 Long term (current) use of inhaled steroids: Secondary | ICD-10-CM | POA: Insufficient documentation

## 2015-08-12 DIAGNOSIS — I4891 Unspecified atrial fibrillation: Secondary | ICD-10-CM | POA: Diagnosis not present

## 2015-08-12 LAB — CBC WITH DIFFERENTIAL/PLATELET
Basophils Absolute: 0 10*3/uL (ref 0.0–0.1)
Basophils Relative: 1 %
EOS ABS: 0.2 10*3/uL (ref 0.0–0.7)
Eosinophils Relative: 3 %
HEMATOCRIT: 29.5 % — AB (ref 39.0–52.0)
HEMOGLOBIN: 9.5 g/dL — AB (ref 13.0–17.0)
LYMPHS ABS: 1 10*3/uL (ref 0.7–4.0)
Lymphocytes Relative: 16 %
MCH: 28.9 pg (ref 26.0–34.0)
MCHC: 32.2 g/dL (ref 30.0–36.0)
MCV: 89.7 fL (ref 78.0–100.0)
MONOS PCT: 11 %
Monocytes Absolute: 0.7 10*3/uL (ref 0.1–1.0)
NEUTROS ABS: 4.4 10*3/uL (ref 1.7–7.7)
NEUTROS PCT: 69 %
Platelets: 165 10*3/uL (ref 150–400)
RBC: 3.29 MIL/uL — AB (ref 4.22–5.81)
RDW: 17.1 % — ABNORMAL HIGH (ref 11.5–15.5)
WBC: 6.3 10*3/uL (ref 4.0–10.5)

## 2015-08-12 LAB — BASIC METABOLIC PANEL
Anion gap: 10 (ref 5–15)
BUN: 93 mg/dL — AB (ref 6–20)
CHLORIDE: 102 mmol/L (ref 101–111)
CO2: 26 mmol/L (ref 22–32)
Calcium: 9.5 mg/dL (ref 8.9–10.3)
Creatinine, Ser: 3.35 mg/dL — ABNORMAL HIGH (ref 0.61–1.24)
GFR calc non Af Amer: 17 mL/min — ABNORMAL LOW (ref >60)
GFR, EST AFRICAN AMERICAN: 20 mL/min — AB (ref >60)
Glucose, Bld: 127 mg/dL — ABNORMAL HIGH (ref 65–99)
POTASSIUM: 4.3 mmol/L (ref 3.5–5.1)
SODIUM: 138 mmol/L (ref 135–145)

## 2015-08-12 LAB — BRAIN NATRIURETIC PEPTIDE: B NATRIURETIC PEPTIDE 5: 178.6 pg/mL — AB (ref 0.0–100.0)

## 2015-08-12 LAB — PROTIME-INR
INR: 2.23 — AB (ref 0.00–1.49)
Prothrombin Time: 24.5 seconds — ABNORMAL HIGH (ref 11.6–15.2)

## 2015-08-12 LAB — TROPONIN I: TROPONIN I: 0.07 ng/mL — AB (ref <0.031)

## 2015-08-12 MED ORDER — FUROSEMIDE 10 MG/ML IJ SOLN
20.0000 mg | Freq: Once | INTRAMUSCULAR | Status: AC
  Administered 2015-08-12: 20 mg via INTRAVENOUS
  Filled 2015-08-12: qty 2

## 2015-08-12 NOTE — ED Notes (Signed)
Reports hx of CHF-increase swelling "all over" and SOB since Monday-NAD-pt did drive self here

## 2015-08-12 NOTE — ED Notes (Signed)
MD at bedside. 

## 2015-08-12 NOTE — ED Provider Notes (Signed)
CSN: [id number]     Arrival date & time 08/12/15  1905 History  By signing my name below, I, Doran Stabler, attest that this documentation has been prepared under the direction and in the presence of Deno Etienne, DO. Electronically Signed: Doran Stabler, ED Scribe. 08/12/2015. 7:32 PM.   Chief Complaint  Patient presents with  . Congestive Heart Failure   The history is provided by the patient. No language interpreter was used.   HPI Comments: Bruce Cole is a 75 y.o. male with a h/o CHF, A-Fib, and HTN who presents to the Emergency Department complaining of constant, ongoing SOB for the past 2 day; worsened this evening. Pt also reports he feels he isn't making any urine. However has the urge to go but cannot. Last urine output yesterday. Pt reports 7-8 lbs weight gain over the past few days. Bruce Cole states symptoms are consistent with CHF. He states he typically holds fluid in his abdomen. He also reports mild, ongoing diarrhea and had his last BM, PTA. Denies any hematochezia or melena. Pt denies AICD.  Pt denies abdominal pain, fever, chills, or chest pain. Pt has been complaint with strict diet at home. He is unaware of his most recent EF from echocardiogram. He denies any known history of prostate issues.  He is followed by Dr. Pierre Bali  Past Medical History  Diagnosis Date  . Nephrolithiasis   . Arthritis     rheumatoid  . Colon polyp 2000  . Atrial fibrillation ([place])   . Unspecified essential hypertension   . Gout   . CHF (congestive heart failure) [facility])    Past Surgical History  Procedure Laterality Date  . Spine surgery      x 2  . Joint replacement      Total L-Hip replacement, Right Knee 10/20/09  . Cholecystectomy  1994  . Left and right heart catheterization with coronary angiogram N/A 02/22/2013    Procedure: LEFT AND RIGHT HEART CATHETERIZATION WITH CORONARY ANGIOGRAM;  Surgeon: Jolaine Artist, MD;  Location: [facility] CATH LAB;  Service: Cardiovascular;   Laterality: N/A;   Family History  Problem Relation Age of Onset  . Hypertension Mother   . Arthritis Mother     ?RA  . Hypertension Father    Social History  Substance Use Topics  . Smoking status: Never Smoker   . Smokeless tobacco: Never Used  . Alcohol Use: Yes     Comment: occasional    Review of Systems  Constitutional: Positive for unexpected weight change. Negative for fever and chills.  HENT: Negative for congestion and facial swelling.   Eyes: Negative for discharge and visual disturbance.  Respiratory: Positive for shortness of breath.   Cardiovascular: Negative for chest pain and palpitations.  Gastrointestinal: Negative for vomiting, abdominal pain and diarrhea.  Genitourinary: Negative for hematuria.  Musculoskeletal: Negative for myalgias and arthralgias.  Skin: Negative for color change and rash.  Neurological: Negative for tremors, syncope and headaches.  Psychiatric/Behavioral: Negative for confusion and dysphoric mood.      Allergies  Ace inhibitors  Home Medications   Prior to Admission medications   Medication Sig Start Date End Date Taking? Authorizing Provider  allopurinol (ZYLOPRIM) 100 MG tablet TAKE 2 TABLETS BY MOUTH EVERY DAY 07/17/15   Alferd Apa Lowne, DO  amLODipine (NORVASC) 10 MG tablet TAKE 1 TABLET BY MOUTH EVERY DAY 04/17/15   Debbrah Alar, NP  carvedilol (COREG) 12.5 MG tablet TAKE 1 TABLET BY MOUTH EVERY MORNING AND TAKE  1/2 TABLET BY MOUTH IN THE EVENING 02/13/15   Debbrah Alar, NP  colchicine 0.6 MG tablet TAKE 2 TABLETS BY MOUTH AT THE START OF GOUT FLARE AND TAKE 1 TABLET 3 HOURS LATER; MAX 3 TABS PER GOUT ATTACK 05/25/15   Debbrah Alar, NP  fluticasone (FLONASE) 50 MCG/ACT nasal spray Place 2 sprays into both nostrils daily. 03/18/15   Debbrah Alar, NP  furosemide (LASIX) 40 MG tablet TAKE 2 TABLETS BY MOUTH TWICE DAILY 05/15/15   Debbrah Alar, NP  glucose blood test strip Use as to check blood sugar 1 to  2 times a day.  DX  E11.8    Historical Provider, MD  hydrALAZINE (APRESOLINE) 50 MG tablet TAKE 1 TABLET BY MOUTH THREE TIMES DAILY 07/28/15   Debbrah Alar, NP  HYDROcodone-acetaminophen (NORCO/VICODIN) 5-325 MG tablet Take 1-2 tablets by mouth every 6 (six) hours as needed for moderate pain. 05/01/15   Fredia Sorrow, MD  hydroxychloroquine (PLAQUENIL) 200 MG tablet Take 200 mg by mouth 2 (two) times daily. 09/11/14   Bo Merino, MD  isosorbide mononitrate (IMDUR) 30 MG 24 hr tablet TAKE 1 TABLET BY MOUTH EVERY DAY 07/17/15   Larey Dresser, MD  metolazone (ZAROXOLYN) 2.5 MG tablet TAKE 1 TABLET BY MOUTH 30 MINUTES PRIOR TO YOUR MORNING FLUID PILL 2 DAYS PER WEEK 07/27/15   Jolaine Artist, MD  Multiple Vitamins-Minerals (PX MENS MULTIVITAMINS) TABS Take 1 tablet by mouth daily.    Historical Provider, MD  ondansetron (ZOFRAN ODT) 4 MG disintegrating tablet Take 1 tablet (4 mg total) by mouth every 8 (eight) hours as needed for nausea or vomiting. 05/01/15   Fredia Sorrow, MD  potassium chloride SA (K-DUR,KLOR-CON) 20 MEQ tablet TAKE 1 TABLET BY MOUTH THREE TIMES DAILY 07/31/15   Jolaine Artist, MD  promethazine (PHENERGAN) 25 MG tablet Take 1 tablet by mouth as needed. 05/01/15   Historical Provider, MD  sildenafil (VIAGRA) 100 MG tablet Take 100 mg by mouth daily as needed for erectile dysfunction.     Historical Provider, MD  spironolactone (ALDACTONE) 25 MG tablet TAKE 1 TABLET BY MOUTH DAILY 07/22/15   Jolaine Artist, MD  terazosin (HYTRIN) 1 MG capsule TAKE 1 CAPSULE BY MOUTH EVERY NIGHT AT BEDTIME 10/15/14   Debbrah Alar, NP  traMADol (ULTRAM) 50 MG tablet Take 50 mg by mouth every 6 (six) hours as needed.    Historical Provider, MD  valACYclovir (VALTREX) 500 MG tablet Take 1 tablet (500 mg total) by mouth daily. 11/18/13   Debbrah Alar, NP  warfarin (COUMADIN) 7.5 MG tablet TAKE 1 TABLET BY MOUTH EVERY DAILY AS DIRECTED PER COUMADIN CLINIC 02/02/15   Evans Lance, MD   BP 103/52 mmHg  Pulse 44  Temp(Src) 97.5 F (36.4 C) (Oral)  Resp 28  Ht 6\' 3"  (1.905 m)  Wt 280 lb (127.007 kg)  BMI 35.00 kg/m2  SpO2 97% Physical Exam  Constitutional: He is oriented to person, place, and time. He appears well-developed and well-nourished.  HENT:  Head: Normocephalic and atraumatic.  Eyes: EOM are normal. Pupils are equal, round, and reactive to light.  Neck: Normal range of motion. Neck supple. JVD (to the angle of the jaw) present.  Cardiovascular: Normal rate and regular rhythm.  Exam reveals no gallop and no friction rub.   No murmur heard. Did not hear 3rd heart sound  Pulmonary/Chest: No respiratory distress. He has no wheezes.  Mildly tachypneic  Abdominal: He exhibits distension. There is no rebound  and no guarding.  tympanitic on percussion  Musculoskeletal: Normal range of motion. He exhibits edema (3+ bilaterally up to the knees).  Neurological: He is alert and oriented to person, place, and time.  Skin: No rash noted. No pallor.  Psychiatric: He has a normal mood and affect. His behavior is normal.  Nursing note and vitals reviewed.   ED Course  Procedures  DIAGNOSTIC STUDIES: Oxygen Saturation is 96% on RA, normal by my interpretation.    COORDINATION OF CARE: 7:24 PMWill order CXR, blood test and EKG.  Discussed treatment plan with pt at bedside and pt agreed to plan.   Labs Review Labs Reviewed  CBC WITH DIFFERENTIAL/PLATELET - Abnormal; Notable for the following:    RBC 3.29 (*)    Hemoglobin 9.5 (*)    HCT 29.5 (*)    RDW 17.1 (*)    All other components within normal limits  BASIC METABOLIC PANEL - Abnormal; Notable for the following:    Glucose, Bld 127 (*)    BUN 93 (*)    Creatinine, Ser 3.35 (*)    GFR calc non Af Amer 17 (*)    GFR calc Af Amer 20 (*)    All other components within normal limits  TROPONIN I - Abnormal; Notable for the following:    Troponin I 0.07 (*)    All other components within normal  limits  BRAIN NATRIURETIC PEPTIDE - Abnormal; Notable for the following:    B Natriuretic Peptide 178.6 (*)    All other components within normal limits  PROTIME-INR - Abnormal; Notable for the following:    Prothrombin Time 24.5 (*)    INR 2.23 (*)    All other components within normal limits    Imaging Review Dg Chest 2 View  08/12/2015  CLINICAL DATA:  Shortness of breath for 2 days. Difficulty urinating. Initial encounter. EXAM: CHEST  2 VIEW COMPARISON:  PA and lateral chest 04/15/2015. FINDINGS: There is marked cardiomegaly without pulmonary edema. No pneumothorax or pleural effusion is identified. IMPRESSION: Cardiomegaly without acute disease. Electronically Signed   By: Inge Rise M.D.   On: 08/12/2015 20:05   I have personally reviewed and evaluated these images and lab results as part of my medical decision-making.  ED ECG REPORT   Date: 08/12/2015  Rate: 54  Rhythm: junctional  QRS Axis: normal  Intervals: normal  ST/T Wave abnormalities: nonspecific ST changes  Conduction Disutrbances:none  Narrative Interpretation:   Old EKG Reviewed: unchanged  I have personally reviewed the EKG tracing and agree with the computerized printout as noted.   MDM   Final diagnoses:  Chronic diastolic heart failure (HCC)  AKI (acute kidney injury) ([place])    75 yo M with a chief complaint shortness of breath. This been an ongoing issue over the past week. Patient states that this is related to him having increasing swelling to bilateral lower extremities in his abdomen. He feels like he is been taking his Lasix but he has not been having the significant urine output that he usually does.  On exam with signs of heart failure, however he has clear lung sounds. Patient has gained 7 pounds over the past week or so. Laboratory evaluation with a mildly positive troponin looks to be at his baseline over the past few evaluations. Patient also having an AKI, chest x-ray was clear as well  as a BNP was just mildly elevated. Discussed the results with the patient. Shared decision making at bedside.  Discussed AKI  as well as mildly elevated trop.  He is feeling mildly better after 1 dose of IV Lasix. He is requesting discharge home and will call his cardiologist in the morning.   I personally performed the services described in this documentation, which was scribed in my presence. The recorded information has been reviewed and is accurate.   9:46 PM:  I have discussed the diagnosis/risks/treatment options with the patient and family and believe the pt to be eligible for discharge home to follow-up with Cards. We also discussed returning to the ED immediately if new or worsening sx occur. We discussed the sx which are most concerning (e.g., sudden worsening sob, chest pain) that necessitate immediate return. Medications administered to the patient during their visit and any new prescriptions provided to the patient are listed below.  Medications given during this visit Medications  furosemide (LASIX) injection 20 mg (20 mg Intravenous Given 08/12/15 2031)    New Prescriptions   No medications on file    The patient appears reasonably screen and/or stabilized for discharge and I doubt any other medical condition or other [facility] requiring further screening, evaluation, or treatment in the ED at this time prior to discharge.     Deno Etienne, DO 08/12/15 2148

## 2015-08-12 NOTE — Discharge Instructions (Signed)
Your renal function was worse than normal. Call your cardiologist in the morning for instructions on what they want to do with your Lasix. Return for worsening trouble breathing. Chronic Kidney Disease Chronic kidney disease happens when the kidneys are damaged over a long period. The kidneys are two organs that do many important jobs in the body. These jobs include:  Removing wastes and extra fluids from the blood.  Making hormones that help to keep the body healthy.  Making sure that the body has the right amount of fluids and chemicals. Chronic kidney disease may be caused by many things. The kidney damage occurs slowly. If too much damage occurs, the kidneys may stop working the way that they should. This is dangerous. Treatment can help to slow down the damage and keep it from getting worse. HOME CARE  Follow your diet as told by your doctor. You may need to limit the amount of salt (sodium) and protein that you eat each day.  Take medicines only as told by your doctor. Do not take any new medicines unless your doctor approves it.  Quit smoking if you smoke. Talk to your doctor about programs that may help you quit smoking.  Have your blood pressure checked regularly and keep track of the results.  Start or keep doing an exercise plan.  Get shots (immunizations) as told by your doctor.  Take vitamins and minerals as told by your doctor.  Keep all follow-up visits as told by your doctor. This is important. GET HELP RIGHT AWAY IF:   Your symptoms get worse.  You have new symptoms.  You have symptoms of end-stage kidney disease. These include:  Headaches.  Skin that is darker or lighter than normal.  Numbness in the hands or feet.  Easy bruising.  Frequent hiccups.  Stopping of menstrual periods in women.  You have a fever.  You are making very little pee (urine).  You have pain or bleeding when you pee.   This information is not intended to replace advice given  to you by your health care provider. Make sure you discuss any questions you have with your health care provider.   Document Released: 09/28/2009 Document Revised: 03/25/2015 Document Reviewed: 03/02/2012 Elsevier Interactive Patient Education [employer].

## 2015-08-12 NOTE — ED Notes (Signed)
Pt c/o increased swelling to legs and sob onset Monday   No distress noted

## 2015-08-13 ENCOUNTER — Telehealth (HOSPITAL_COMMUNITY): Payer: Self-pay | Admitting: Cardiology

## 2015-08-13 ENCOUNTER — Telehealth: Payer: Self-pay | Admitting: Family

## 2015-08-13 NOTE — Telephone Encounter (Signed)
Patient left voicemail requesting someone return call Reports he was in the ER last night and advised to call Dr.Bensimhon\   [facility]

## 2015-08-13 NOTE — Telephone Encounter (Signed)
Please contact patient to arrange an ED follow up with me in 1 week.

## 2015-08-14 ENCOUNTER — Other Ambulatory Visit (HOSPITAL_COMMUNITY): Payer: Self-pay | Admitting: *Deleted

## 2015-08-14 MED ORDER — METOLAZONE 2.5 MG PO TABS: 2.5000 mg | ORAL_TABLET | ORAL | Status: DC

## 2015-08-14 NOTE — Telephone Encounter (Signed)
Pt has appt scheduled 1/30 for ER follow up - pt called in stating he needs something for the hack in this throat before then and states he needs a call from [place]. Pt ph# [phone].

## 2015-08-14 NOTE — Telephone Encounter (Signed)
Notified pt and he voices understanding. 

## 2015-08-14 NOTE — Telephone Encounter (Signed)
Start mucinex 600mg  twice daily as needed. Follow up sooner with [place] if fever, weakness, worsening symptoms.

## 2015-08-14 NOTE — Telephone Encounter (Signed)
Spoke with pt, he states he has chest and nasal congestion. Unable to expectorate when he coughs. Has not tried anything otc yet as he was unsure what would be safe for him.  Pt has ER f/u with you on 1/30 but wants to know what he can take in the mean time? Please advise.

## 2015-08-16 ENCOUNTER — Other Ambulatory Visit: Payer: Self-pay | Admitting: Family Medicine

## 2015-08-16 ENCOUNTER — Emergency Department (HOSPITAL_BASED_OUTPATIENT_CLINIC_OR_DEPARTMENT_OTHER)
Admission: EM | Admit: 2015-08-16 | Discharge: 2015-08-17 | Disposition: A | Discharge status: Home / Self Care | Payer: Medicare Other | Source: Home / Self Care | Attending: Emergency Medicine | Admitting: Emergency Medicine

## 2015-08-16 ENCOUNTER — Other Ambulatory Visit: Payer: Self-pay | Admitting: Family

## 2015-08-16 ENCOUNTER — Encounter (HOSPITAL_BASED_OUTPATIENT_CLINIC_OR_DEPARTMENT_OTHER): Payer: Self-pay | Admitting: *Deleted

## 2015-08-16 DIAGNOSIS — R609 Edema, unspecified: Secondary | ICD-10-CM

## 2015-08-16 DIAGNOSIS — I129 Hypertensive chronic kidney disease with stage 1 through stage 4 chronic kidney disease, or unspecified chronic kidney disease: Secondary | ICD-10-CM

## 2015-08-16 DIAGNOSIS — N189 Chronic kidney disease, unspecified: Secondary | ICD-10-CM | POA: Insufficient documentation

## 2015-08-16 DIAGNOSIS — N184 Chronic kidney disease, stage 4 (severe): Secondary | ICD-10-CM | POA: Diagnosis not present

## 2015-08-16 DIAGNOSIS — I712 Thoracic aortic aneurysm, without rupture: Secondary | ICD-10-CM | POA: Diagnosis not present

## 2015-08-16 DIAGNOSIS — Z86010 Personal history of colonic polyps: Secondary | ICD-10-CM

## 2015-08-16 DIAGNOSIS — Z87442 Personal history of urinary calculi: Secondary | ICD-10-CM

## 2015-08-16 DIAGNOSIS — Z9889 Other specified postprocedural states: Secondary | ICD-10-CM

## 2015-08-16 DIAGNOSIS — E1122 Type 2 diabetes mellitus with diabetic chronic kidney disease: Secondary | ICD-10-CM | POA: Diagnosis not present

## 2015-08-16 DIAGNOSIS — Z7901 Long term (current) use of anticoagulants: Secondary | ICD-10-CM

## 2015-08-16 DIAGNOSIS — M109 Gout, unspecified: Secondary | ICD-10-CM | POA: Insufficient documentation

## 2015-08-16 DIAGNOSIS — Z79899 Other long term (current) drug therapy: Secondary | ICD-10-CM

## 2015-08-16 DIAGNOSIS — I110 Hypertensive heart disease with heart failure: Secondary | ICD-10-CM | POA: Diagnosis not present

## 2015-08-16 DIAGNOSIS — N179 Acute kidney failure, unspecified: Secondary | ICD-10-CM | POA: Diagnosis not present

## 2015-08-16 DIAGNOSIS — I509 Heart failure, unspecified: Secondary | ICD-10-CM

## 2015-08-16 DIAGNOSIS — J209 Acute bronchitis, unspecified: Secondary | ICD-10-CM

## 2015-08-16 DIAGNOSIS — Z7951 Long term (current) use of inhaled steroids: Secondary | ICD-10-CM

## 2015-08-16 DIAGNOSIS — I130 Hypertensive heart and chronic kidney disease with heart failure and stage 1 through stage 4 chronic kidney disease, or unspecified chronic kidney disease: Secondary | ICD-10-CM | POA: Diagnosis not present

## 2015-08-16 DIAGNOSIS — R001 Bradycardia, unspecified: Secondary | ICD-10-CM | POA: Diagnosis not present

## 2015-08-16 DIAGNOSIS — R600 Localized edema: Secondary | ICD-10-CM

## 2015-08-16 DIAGNOSIS — N289 Disorder of kidney and ureter, unspecified: Secondary | ICD-10-CM

## 2015-08-16 DIAGNOSIS — R0602 Shortness of breath: Secondary | ICD-10-CM | POA: Diagnosis not present

## 2015-08-16 NOTE — ED Notes (Signed)
Pt states that he has been sob for over a week. Was seen at here in the ED on Tues and was to f/u with cardiology. States he was not able to get an appointment until Feb 5th with cardiology and also was not able to get advise regarding his lasix dose from cardiology as well.  He does have an appointment to see his NP in the morning. Pt c/o abdomen feeling "tight" and bilateral lower extremities. States he feels the need to urinate as well, but not a lot comes out. Pt presents sob with exertion. resp even and slightly labored.

## 2015-08-16 NOTE — ED Provider Notes (Signed)
CSN: [id number]     Arrival date & time 08/16/15  2329 History  By signing my name below, I, Soijett Blue, attest that this documentation has been prepared under the direction and in the presence of Shanon Rosser, MD. Electronically Signed: Soijett Blue, ED Scribe. 08/16/2015. 12:03 AM.   Chief Complaint  Patient presents with  . Shortness of Breath      The history is provided by the patient. No language interpreter was used.    HPI Comments: Bruce Cole is a 75 y.o. male with a medical hx of nephrolithiasis, A-fib, CHF, and HTN who presents to the Emergency Department complaining of exertional SOB and cough onset 1 week. Pt was seen in the ED on 08/12/2015 for his symptoms and informed to follow up with his cardiologist. Pt reports that the IV lasix that he was given in the ED alleviated his symptoms but his Rx lasix has not alleviated his symptoms. Pt tried to explain his ED visit and obtain an earlier appointment with his cardiologist with no success. Pt cardiology appointment is set for 08/23/2015. Pt has an appointment with his NP, Debbrah Alar in the morning. Pt is having associated symptoms of abdominal bloating, decreased urine output, bilateral leg swelling, and nausea. He notes that he has not tried any medications for the relief of his symptoms. He denies CP, fever, vomiting, and any other symptoms. Pt is on coumadin for A-fib.  Per pt chart review: Pt was seen in the ED on 08/12/2015 for CHF and had labs, unchanged EKG, and CXR notable for cardiomegaly without any acute disease. Pt was not Rx any medications, but he was informed to follow up with his cardiologist the next day for further evaluation.  Past Medical History  Diagnosis Date  . Nephrolithiasis   . Arthritis     rheumatoid  . Colon polyp 2000  . Atrial fibrillation ([place])   . Unspecified essential hypertension   . Gout   . CHF (congestive heart failure) [facility])    Past Surgical History  Procedure Laterality  Date  . Spine surgery      x 2  . Joint replacement      Total L-Hip replacement, Right Knee 10/20/09  . Cholecystectomy  1994  . Left and right heart catheterization with coronary angiogram N/A 02/22/2013    Procedure: LEFT AND RIGHT HEART CATHETERIZATION WITH CORONARY ANGIOGRAM;  Surgeon: Jolaine Artist, MD;  Location: [facility] CATH LAB;  Service: Cardiovascular;  Laterality: N/A;   Family History  Problem Relation Age of Onset  . Hypertension Mother   . Arthritis Mother     ?RA  . Hypertension Father    Social History  Substance Use Topics  . Smoking status: Never Smoker   . Smokeless tobacco: Never Used  . Alcohol Use: Yes     Comment: occasional    Review of Systems  A complete 10 system review of systems was obtained and all systems are negative except as noted in the HPI and PMH.   Allergies  Ace inhibitors  Home Medications   Prior to Admission medications   Medication Sig Start Date End Date Taking? Authorizing Provider  allopurinol (ZYLOPRIM) 100 MG tablet TAKE 2 TABLETS BY MOUTH EVERY DAY 07/17/15   Alferd Apa Lowne, DO  amLODipine (NORVASC) 10 MG tablet TAKE 1 TABLET BY MOUTH EVERY DAY 04/17/15   Debbrah Alar, NP  carvedilol (COREG) 12.5 MG tablet TAKE 1 TABLET BY MOUTH EVERY MORNING AND TAKE 1/2 TABLET BY MOUTH  IN THE EVENING 02/13/15   Debbrah Alar, NP  colchicine 0.6 MG tablet TAKE 2 TABLETS BY MOUTH AT THE START OF GOUT FLARE AND TAKE 1 TABLET 3 HOURS LATER; MAX 3 TABS PER GOUT ATTACK 05/25/15   Debbrah Alar, NP  fluticasone (FLONASE) 50 MCG/ACT nasal spray Place 2 sprays into both nostrils daily. 03/18/15   Debbrah Alar, NP  furosemide (LASIX) 40 MG tablet TAKE 2 TABLETS BY MOUTH TWICE DAILY 05/15/15   Debbrah Alar, NP  glucose blood test strip Use as to check blood sugar 1 to 2 times a day.  DX  E11.8    Historical Provider, MD  hydrALAZINE (APRESOLINE) 50 MG tablet TAKE 1 TABLET BY MOUTH THREE TIMES DAILY 07/28/15   Debbrah Alar, NP   HYDROcodone-acetaminophen (NORCO/VICODIN) 5-325 MG tablet Take 1-2 tablets by mouth every 6 (six) hours as needed for moderate pain. 05/01/15   Fredia Sorrow, MD  hydroxychloroquine (PLAQUENIL) 200 MG tablet Take 200 mg by mouth 2 (two) times daily. 09/11/14   Bo Merino, MD  isosorbide mononitrate (IMDUR) 30 MG 24 hr tablet TAKE 1 TABLET BY MOUTH EVERY DAY 07/17/15   Larey Dresser, MD  metolazone (ZAROXOLYN) 2.5 MG tablet Take 1 tablet (2.5 mg total) by mouth 2 (two) times a week. 08/14/15   Jolaine Artist, MD  Multiple Vitamins-Minerals (PX MENS MULTIVITAMINS) TABS Take 1 tablet by mouth daily.    Historical Provider, MD  ondansetron (ZOFRAN ODT) 4 MG disintegrating tablet Take 1 tablet (4 mg total) by mouth every 8 (eight) hours as needed for nausea or vomiting. 05/01/15   Fredia Sorrow, MD  potassium chloride SA (K-DUR,KLOR-CON) 20 MEQ tablet TAKE 1 TABLET BY MOUTH THREE TIMES DAILY 07/31/15   Jolaine Artist, MD  promethazine (PHENERGAN) 25 MG tablet Take 1 tablet by mouth as needed. 05/01/15   Historical Provider, MD  sildenafil (VIAGRA) 100 MG tablet Take 100 mg by mouth daily as needed for erectile dysfunction.     Historical Provider, MD  spironolactone (ALDACTONE) 25 MG tablet TAKE 1 TABLET BY MOUTH DAILY 07/22/15   Jolaine Artist, MD  terazosin (HYTRIN) 1 MG capsule TAKE 1 CAPSULE BY MOUTH EVERY NIGHT AT BEDTIME 10/15/14   Debbrah Alar, NP  traMADol (ULTRAM) 50 MG tablet Take 50 mg by mouth every 6 (six) hours as needed.    Historical Provider, MD  valACYclovir (VALTREX) 500 MG tablet Take 1 tablet (500 mg total) by mouth daily. 11/18/13   Debbrah Alar, NP  warfarin (COUMADIN) 7.5 MG tablet TAKE 1 TABLET BY MOUTH EVERY DAILY AS DIRECTED PER COUMADIN CLINIC 02/02/15   Evans Lance, MD   BP 123/72 mmHg  Pulse 50  Resp 26  SpO2 95%   Physical Exam General: Well-developed, well-nourished male in no acute distress; appearance consistent with age of  record HENT: normocephalic; atraumatic Eyes: pupils equal, round and reactive to light; extraocular muscles intact Neck: supple Heart: regular rate and rhythm; no murmurs, rubs or gallops Lungs: clear to auscultation bilaterally. Decreased air movement bilaterally with shallow breathes.  Abdomen: soft; nondistended; nontender; no masses or hepatosplenomegaly; bowel sounds present Extremities: No deformity; full range of motion; pulses normal. 2+ pitting edema of the lower legs and ankles.  Neurologic: Awake, alert and oriented; motor function intact in all extremities and symmetric; no facial droop Skin: Warm and dry Psychiatric: Normal mood and affect   ED Course  Procedures (including critical care time) DIAGNOSTIC STUDIES: Oxygen Saturation is 98% on RA, nl by  my interpretation.    COORDINATION OF CARE: 12:02 AM Discussed treatment plan with pt at bedside which includes labs, breathing treatment, CXR, lasix and pt agreed to plan.    MDM   Nursing notes and vitals signs, including pulse oximetry, reviewed.  Summary of this visit's results, reviewed by myself:   EKG Interpretation  Date/Time:  Sunday August 16 2015 23:40:04 EST Ventricular Rate:  49 PR Interval:    QRS Duration: 121 QT Interval:  491 QTC Calculation: 443 R Axis:   -73 Text Interpretation:  Atrial fibrillation Ventricular premature complex Nonspecific IVCD with LAD Inferior infarct, old Consider anterior infarct Lateral leads are also involved No significant change was found Confirmed by Elois Averitt  MD, Jenny Reichmann ([id number]) on 08/17/2015 12:13:38 AM       Labs:  Results for orders placed or performed during the hospital encounter of 08/16/15 (from the past 24 hour(s))  CBC with Differential     Status: Abnormal   Collection Time: 08/16/15 11:50 PM  Result Value Ref Range   WBC 5.8 4.0 - 10.5 K/uL   RBC 3.33 (L) 4.22 - 5.81 MIL/uL   Hemoglobin 9.6 (L) 13.0 - 17.0 g/dL   HCT 29.8 (L) 39.0 - 52.0 %   MCV 89.5 78.0  - 100.0 fL   MCH 28.8 26.0 - 34.0 pg   MCHC 32.2 30.0 - 36.0 g/dL   RDW 17.5 (H) 11.5 - 15.5 %   Platelets 168 150 - 400 K/uL   Neutrophils Relative % 68 %   Neutro Abs 4.0 1.7 - 7.7 K/uL   Lymphocytes Relative 19 %   Lymphs Abs 1.1 0.7 - 4.0 K/uL   Monocytes Relative 10 %   Monocytes Absolute 0.6 0.1 - 1.0 K/uL   Eosinophils Relative 2 %   Eosinophils Absolute 0.1 0.0 - 0.7 K/uL   Basophils Relative 1 %   Basophils Absolute 0.0 0.0 - 0.1 K/uL  Brain natriuretic peptide     Status: Abnormal   Collection Time: 08/16/15 11:50 PM  Result Value Ref Range   B Natriuretic Peptide 174.2 (H) 0.0 - 100.0 pg/mL  Troponin I     Status: Abnormal   Collection Time: 08/16/15 11:50 PM  Result Value Ref Range   Troponin I 0.06 (H) <0.031 ng/mL  Basic metabolic panel     Status: Abnormal   Collection Time: 08/16/15 11:50 PM  Result Value Ref Range   Sodium 139 135 - 145 mmol/L   Potassium 4.0 3.5 - 5.1 mmol/L   Chloride 101 101 - 111 mmol/L   CO2 24 22 - 32 mmol/L   Glucose, Bld 126 (H) 65 - 99 mg/dL   BUN 89 (H) 6 - 20 mg/dL   Creatinine, Ser 3.51 (H) 0.61 - 1.24 mg/dL   Calcium 10.0 8.9 - 10.3 mg/dL   GFR calc non Af Amer 16 (L) >60 mL/min   GFR calc Af Amer 19 (L) >60 mL/min   Anion gap 14 5 - 15  Protime-INR     Status: Abnormal   Collection Time: 08/16/15 11:50 PM  Result Value Ref Range   Prothrombin Time 27.9 (H) 11.6 - 15.2 seconds   INR 2.65 (H) 0.00 - 1.49    Imaging Studies: Dg Chest 2 View  08/17/2015  CLINICAL DATA:  Shortness of breath for 1 week. EXAM: CHEST  2 VIEW COMPARISON:  08/12/2015. FINDINGS: Development of small right pleural effusion. Associated right basilar opacity. Cardiomegaly is stable. No pulmonary edema or pneumothorax. Osseous structures are  unchanged. IMPRESSION: Developed of small right pleural effusion. Associated right basilar opacity may reflect compressive atelectasis. Pneumonia could have a similar appearance in the appropriate clinical setting.  Cardiomegaly is stable. Electronically Signed   By: Jeb Levering M.D.   On: 08/17/2015 01:08   1:58 AM Air movement significantly improved and patient feels better after DuoNeb treatment. I suspect his dyspnea is due more to bronchitis than CHF. He was given 40 milligrams of Lasix IV without significant diuresis. We will give an additional dose. His troponin is mildly elevated but this is usual for him and likely related to his cardiomyopathy.  2:50 AM Patient had increased diuresis after second dose of Lasix. He states he is ready to go home with follow-up with his primary care physician later today.  I personally performed the services described in this documentation, which was scribed in my presence. The recorded information has been reviewed and is accurate.   Shanon Rosser, MD 08/17/15 [phone]

## 2015-08-17 ENCOUNTER — Ambulatory Visit (INDEPENDENT_AMBULATORY_CARE_PROVIDER_SITE_OTHER): Payer: Medicare Other | Admitting: Family

## 2015-08-17 ENCOUNTER — Non-Acute Institutional Stay (INDEPENDENT_AMBULATORY_CARE_PROVIDER_SITE_OTHER): Payer: Medicare Other | Admitting: Family

## 2015-08-17 ENCOUNTER — Emergency Department (HOSPITAL_BASED_OUTPATIENT_CLINIC_OR_DEPARTMENT_OTHER): Payer: Medicare Other

## 2015-08-17 ENCOUNTER — Telehealth: Payer: Self-pay | Admitting: *Deleted

## 2015-08-17 ENCOUNTER — Inpatient Hospital Stay (HOSPITAL_BASED_OUTPATIENT_CLINIC_OR_DEPARTMENT_OTHER)
Admission: EM | Admit: 2015-08-17 | Discharge: 2015-08-28 | DRG: 264 | Disposition: A | Discharge status: Home Health | Payer: Medicare Other | Attending: Internal Medicine | Admitting: Internal Medicine

## 2015-08-17 ENCOUNTER — Other Ambulatory Visit (HOSPITAL_COMMUNITY): Payer: Self-pay | Admitting: Cardiology

## 2015-08-17 ENCOUNTER — Encounter: Payer: Self-pay | Admitting: Family

## 2015-08-17 ENCOUNTER — Encounter (HOSPITAL_BASED_OUTPATIENT_CLINIC_OR_DEPARTMENT_OTHER): Payer: Self-pay | Admitting: *Deleted

## 2015-08-17 ENCOUNTER — Telehealth: Payer: Self-pay | Admitting: Family

## 2015-08-17 VITALS — BP 128/60 | HR 50 | Temp 98.2°F | Resp 18 | Ht 74.0 in | Wt 291.0 lb

## 2015-08-17 DIAGNOSIS — N184 Chronic kidney disease, stage 4 (severe): Secondary | ICD-10-CM | POA: Diagnosis not present

## 2015-08-17 DIAGNOSIS — R0600 Dyspnea, unspecified: Secondary | ICD-10-CM | POA: Diagnosis not present

## 2015-08-17 DIAGNOSIS — M069 Rheumatoid arthritis, unspecified: Secondary | ICD-10-CM | POA: Diagnosis present

## 2015-08-17 DIAGNOSIS — M109 Gout, unspecified: Secondary | ICD-10-CM | POA: Diagnosis present

## 2015-08-17 DIAGNOSIS — N179 Acute kidney failure, unspecified: Secondary | ICD-10-CM | POA: Diagnosis present

## 2015-08-17 DIAGNOSIS — R001 Bradycardia, unspecified: Secondary | ICD-10-CM | POA: Diagnosis not present

## 2015-08-17 DIAGNOSIS — Z79899 Other long term (current) drug therapy: Secondary | ICD-10-CM | POA: Diagnosis not present

## 2015-08-17 DIAGNOSIS — N289 Disorder of kidney and ureter, unspecified: Secondary | ICD-10-CM

## 2015-08-17 DIAGNOSIS — K5900 Constipation, unspecified: Secondary | ICD-10-CM | POA: Diagnosis not present

## 2015-08-17 DIAGNOSIS — I5043 Acute on chronic combined systolic (congestive) and diastolic (congestive) heart failure: Secondary | ICD-10-CM | POA: Diagnosis present

## 2015-08-17 DIAGNOSIS — R0602 Shortness of breath: Secondary | ICD-10-CM | POA: Diagnosis present

## 2015-08-17 DIAGNOSIS — N19 Unspecified kidney failure: Secondary | ICD-10-CM | POA: Insufficient documentation

## 2015-08-17 DIAGNOSIS — D631 Anemia in chronic kidney disease: Secondary | ICD-10-CM | POA: Diagnosis not present

## 2015-08-17 DIAGNOSIS — D649 Anemia, unspecified: Secondary | ICD-10-CM | POA: Diagnosis present

## 2015-08-17 DIAGNOSIS — I509 Heart failure, unspecified: Secondary | ICD-10-CM

## 2015-08-17 DIAGNOSIS — I5042 Chronic combined systolic (congestive) and diastolic (congestive) heart failure: Secondary | ICD-10-CM | POA: Diagnosis not present

## 2015-08-17 DIAGNOSIS — N183 Chronic kidney disease, stage 3 unspecified: Secondary | ICD-10-CM | POA: Diagnosis present

## 2015-08-17 DIAGNOSIS — J90 Pleural effusion, not elsewhere classified: Secondary | ICD-10-CM | POA: Diagnosis present

## 2015-08-17 DIAGNOSIS — Z96642 Presence of left artificial hip joint: Secondary | ICD-10-CM | POA: Diagnosis present

## 2015-08-17 DIAGNOSIS — I10 Essential (primary) hypertension: Secondary | ICD-10-CM | POA: Diagnosis not present

## 2015-08-17 DIAGNOSIS — J189 Pneumonia, unspecified organism: Secondary | ICD-10-CM

## 2015-08-17 DIAGNOSIS — R0902 Hypoxemia: Secondary | ICD-10-CM | POA: Diagnosis present

## 2015-08-17 DIAGNOSIS — I4891 Unspecified atrial fibrillation: Secondary | ICD-10-CM | POA: Diagnosis present

## 2015-08-17 DIAGNOSIS — I5033 Acute on chronic diastolic (congestive) heart failure: Secondary | ICD-10-CM | POA: Insufficient documentation

## 2015-08-17 DIAGNOSIS — J948 Other specified pleural conditions: Secondary | ICD-10-CM | POA: Diagnosis not present

## 2015-08-17 DIAGNOSIS — T502X5A Adverse effect of carbonic-anhydrase inhibitors, benzothiadiazides and other diuretics, initial encounter: Secondary | ICD-10-CM | POA: Diagnosis not present

## 2015-08-17 DIAGNOSIS — Z7901 Long term (current) use of anticoagulants: Secondary | ICD-10-CM

## 2015-08-17 DIAGNOSIS — I517 Cardiomegaly: Secondary | ICD-10-CM | POA: Diagnosis not present

## 2015-08-17 DIAGNOSIS — E1122 Type 2 diabetes mellitus with diabetic chronic kidney disease: Secondary | ICD-10-CM | POA: Diagnosis present

## 2015-08-17 DIAGNOSIS — R339 Retention of urine, unspecified: Secondary | ICD-10-CM | POA: Diagnosis present

## 2015-08-17 DIAGNOSIS — I712 Thoracic aortic aneurysm, without rupture: Secondary | ICD-10-CM | POA: Diagnosis present

## 2015-08-17 DIAGNOSIS — Z6839 Body mass index (BMI) 39.0-39.9, adult: Secondary | ICD-10-CM | POA: Diagnosis not present

## 2015-08-17 DIAGNOSIS — E876 Hypokalemia: Secondary | ICD-10-CM | POA: Diagnosis not present

## 2015-08-17 DIAGNOSIS — Z9889 Other specified postprocedural states: Secondary | ICD-10-CM | POA: Insufficient documentation

## 2015-08-17 DIAGNOSIS — D509 Iron deficiency anemia, unspecified: Secondary | ICD-10-CM | POA: Diagnosis present

## 2015-08-17 DIAGNOSIS — M1990 Unspecified osteoarthritis, unspecified site: Secondary | ICD-10-CM | POA: Diagnosis not present

## 2015-08-17 DIAGNOSIS — N186 End stage renal disease: Secondary | ICD-10-CM | POA: Diagnosis not present

## 2015-08-17 DIAGNOSIS — I130 Hypertensive heart and chronic kidney disease with heart failure and stage 1 through stage 4 chronic kidney disease, or unspecified chronic kidney disease: Principal | ICD-10-CM | POA: Diagnosis present

## 2015-08-17 DIAGNOSIS — E669 Obesity, unspecified: Secondary | ICD-10-CM | POA: Diagnosis present

## 2015-08-17 DIAGNOSIS — I110 Hypertensive heart disease with heart failure: Secondary | ICD-10-CM | POA: Diagnosis not present

## 2015-08-17 DIAGNOSIS — N185 Chronic kidney disease, stage 5: Secondary | ICD-10-CM | POA: Diagnosis not present

## 2015-08-17 DIAGNOSIS — Z888 Allergy status to other drugs, medicaments and biological substances status: Secondary | ICD-10-CM | POA: Diagnosis not present

## 2015-08-17 DIAGNOSIS — I482 Chronic atrial fibrillation: Secondary | ICD-10-CM | POA: Diagnosis present

## 2015-08-17 DIAGNOSIS — E1129 Type 2 diabetes mellitus with other diabetic kidney complication: Secondary | ICD-10-CM | POA: Diagnosis present

## 2015-08-17 LAB — BASIC METABOLIC PANEL
ANION GAP: 13 (ref 5–15)
Anion gap: 14 (ref 5–15)
BUN: 89 mg/dL — ABNORMAL HIGH (ref 6–20)
BUN: 91 mg/dL — ABNORMAL HIGH (ref 6–20)
CALCIUM: 10 mg/dL (ref 8.9–10.3)
CHLORIDE: 101 mmol/L (ref 101–111)
CO2: 24 mmol/L (ref 22–32)
CO2: 24 mmol/L (ref 22–32)
CREATININE: 3.51 mg/dL — AB (ref 0.61–1.24)
Calcium: 9.6 mg/dL (ref 8.9–10.3)
Chloride: 102 mmol/L (ref 101–111)
Creatinine, Ser: 3.64 mg/dL — ABNORMAL HIGH (ref 0.61–1.24)
GFR calc Af Amer: 18 mL/min — ABNORMAL LOW (ref >60)
GFR, EST AFRICAN AMERICAN: 19 mL/min — AB (ref >60)
GFR, EST NON AFRICAN AMERICAN: 15 mL/min — AB (ref >60)
GFR, EST NON AFRICAN AMERICAN: 16 mL/min — AB (ref >60)
Glucose, Bld: 126 mg/dL — ABNORMAL HIGH (ref 65–99)
Glucose, Bld: 126 mg/dL — ABNORMAL HIGH (ref 65–99)
POTASSIUM: 4 mmol/L (ref 3.5–5.1)
Potassium: 4 mmol/L (ref 3.5–5.1)
SODIUM: 139 mmol/L (ref 135–145)
SODIUM: 139 mmol/L (ref 135–145)

## 2015-08-17 LAB — CBC WITH DIFFERENTIAL/PLATELET
BASOS ABS: 0 10*3/uL (ref 0.0–0.1)
BASOS PCT: 1 %
BASOS PCT: 1 %
Basophils Absolute: 0 10*3/uL (ref 0.0–0.1)
EOS ABS: 0.1 10*3/uL (ref 0.0–0.7)
EOS ABS: 0.1 10*3/uL (ref 0.0–0.7)
EOS PCT: 1 %
Eosinophils Relative: 2 %
HCT: 28.7 % — ABNORMAL LOW (ref 39.0–52.0)
HEMATOCRIT: 29.8 % — AB (ref 39.0–52.0)
Hemoglobin: 9.2 g/dL — ABNORMAL LOW (ref 13.0–17.0)
Hemoglobin: 9.6 g/dL — ABNORMAL LOW (ref 13.0–17.0)
LYMPHS ABS: 1.1 10*3/uL (ref 0.7–4.0)
Lymphocytes Relative: 12 %
Lymphocytes Relative: 19 %
Lymphs Abs: 0.7 10*3/uL (ref 0.7–4.0)
MCH: 28.6 pg (ref 26.0–34.0)
MCH: 28.8 pg (ref 26.0–34.0)
MCHC: 32.1 g/dL (ref 30.0–36.0)
MCHC: 32.2 g/dL (ref 30.0–36.0)
MCV: 89.1 fL (ref 78.0–100.0)
MCV: 89.5 fL (ref 78.0–100.0)
MONO ABS: 0.6 10*3/uL (ref 0.1–1.0)
MONOS PCT: 10 %
Monocytes Absolute: 0.5 10*3/uL (ref 0.1–1.0)
Monocytes Relative: 9 %
NEUTROS PCT: 68 %
Neutro Abs: 4 10*3/uL (ref 1.7–7.7)
Neutro Abs: 4.8 10*3/uL (ref 1.7–7.7)
Neutrophils Relative %: 77 %
PLATELETS: 159 10*3/uL (ref 150–400)
PLATELETS: 168 10*3/uL (ref 150–400)
RBC: 3.22 MIL/uL — AB (ref 4.22–5.81)
RBC: 3.33 MIL/uL — ABNORMAL LOW (ref 4.22–5.81)
RDW: 17.5 % — ABNORMAL HIGH (ref 11.5–15.5)
RDW: 17.7 % — ABNORMAL HIGH (ref 11.5–15.5)
WBC: 5.8 10*3/uL (ref 4.0–10.5)
WBC: 6.1 10*3/uL (ref 4.0–10.5)

## 2015-08-17 LAB — PROTIME-INR
INR: 2.65 — AB (ref 0.00–1.49)
PROTHROMBIN TIME: 27.9 s — AB (ref 11.6–15.2)

## 2015-08-17 LAB — TROPONIN I
Troponin I: 0.06 ng/mL — ABNORMAL HIGH (ref <0.031)
Troponin I: 0.08 ng/mL — ABNORMAL HIGH (ref <0.031)

## 2015-08-17 LAB — BRAIN NATRIURETIC PEPTIDE
B NATRIURETIC PEPTIDE 5: 204.1 pg/mL — AB (ref 0.0–100.0)
B Natriuretic Peptide: 174.2 pg/mL — ABNORMAL HIGH (ref 0.0–100.0)

## 2015-08-17 MED ORDER — TERAZOSIN HCL 1 MG PO CAPS
1.0000 mg | ORAL_CAPSULE | Freq: Every day | ORAL | Status: DC
  Administered 2015-08-17 – 2015-08-27 (×10): 1 mg via ORAL
  Filled 2015-08-17 (×13): qty 1

## 2015-08-17 MED ORDER — ALLOPURINOL 100 MG PO TABS
200.0000 mg | ORAL_TABLET | Freq: Every day | ORAL | Status: DC
  Administered 2015-08-18 – 2015-08-28 (×10): 200 mg via ORAL
  Filled 2015-08-17 (×10): qty 2

## 2015-08-17 MED ORDER — TORSEMIDE 20 MG PO TABS
20.0000 mg | ORAL_TABLET | ORAL | Status: AC
  Administered 2015-08-17: 20 mg via ORAL
  Filled 2015-08-17: qty 1

## 2015-08-17 MED ORDER — FUROSEMIDE 40 MG PO TABS: 160.0000 mg | ORAL_TABLET | Freq: Two times a day (BID) | ORAL | Status: DC

## 2015-08-17 MED ORDER — HYDROXYCHLOROQUINE SULFATE 200 MG PO TABS
200.0000 mg | ORAL_TABLET | Freq: Two times a day (BID) | ORAL | Status: DC
  Administered 2015-08-18 – 2015-08-28 (×20): 200 mg via ORAL
  Filled 2015-08-17 (×23): qty 1

## 2015-08-17 MED ORDER — ISOSORBIDE MONONITRATE ER 30 MG PO TB24
30.0000 mg | ORAL_TABLET | Freq: Every day | ORAL | Status: DC
  Administered 2015-08-18 – 2015-08-28 (×10): 30 mg via ORAL
  Filled 2015-08-17 (×10): qty 1

## 2015-08-17 MED ORDER — ONDANSETRON HCL 4 MG/2ML IJ SOLN
4.0000 mg | Freq: Four times a day (QID) | INTRAMUSCULAR | Status: DC | PRN
  Administered 2015-08-20 – 2015-08-26 (×3): 4 mg via INTRAVENOUS
  Filled 2015-08-17 (×3): qty 2

## 2015-08-17 MED ORDER — ACETAMINOPHEN 325 MG PO TABS: 650.0000 mg | ORAL_TABLET | ORAL | Status: DC | PRN

## 2015-08-17 MED ORDER — WARFARIN SODIUM 7.5 MG PO TABS
7.5000 mg | ORAL_TABLET | Freq: Every day | ORAL | Status: DC
  Administered 2015-08-17 – 2015-08-18 (×2): 7.5 mg via ORAL
  Filled 2015-08-17 (×2): qty 1

## 2015-08-17 MED ORDER — FUROSEMIDE 10 MG/ML IJ SOLN
20.0000 mg | Freq: Once | INTRAMUSCULAR | Status: AC
  Administered 2015-08-17: 20 mg via INTRAVENOUS
  Filled 2015-08-17: qty 2

## 2015-08-17 MED ORDER — SODIUM CHLORIDE 0.9% FLUSH: 3.0000 mL | INTRAVENOUS | Status: DC | PRN

## 2015-08-17 MED ORDER — GUAIFENESIN ER 600 MG PO TB12
600.0000 mg | ORAL_TABLET | Freq: Two times a day (BID) | ORAL | Status: DC
  Administered 2015-08-18 – 2015-08-28 (×20): 600 mg via ORAL
  Filled 2015-08-17 (×21): qty 1

## 2015-08-17 MED ORDER — SPIRONOLACTONE 25 MG PO TABS
25.0000 mg | ORAL_TABLET | Freq: Every day | ORAL | Status: DC
  Administered 2015-08-18: 25 mg via ORAL
  Filled 2015-08-17: qty 1

## 2015-08-17 MED ORDER — ADULT MULTIVITAMIN W/MINERALS CH
1.0000 | ORAL_TABLET | Freq: Every day | ORAL | Status: DC
  Administered 2015-08-18 – 2015-08-28 (×10): 1 via ORAL
  Filled 2015-08-17 (×10): qty 1

## 2015-08-17 MED ORDER — CARVEDILOL 6.25 MG PO TABS
6.2500 mg | ORAL_TABLET | Freq: Two times a day (BID) | ORAL | Status: DC
  Administered 2015-08-17 – 2015-08-19 (×3): 6.25 mg via ORAL
  Filled 2015-08-17 (×4): qty 1

## 2015-08-17 MED ORDER — ALBUTEROL SULFATE (2.5 MG/3ML) 0.083% IN NEBU
2.5000 mg | INHALATION_SOLUTION | RESPIRATORY_TRACT | Status: DC
  Administered 2015-08-18: 2.5 mg via RESPIRATORY_TRACT
  Filled 2015-08-17: qty 3

## 2015-08-17 MED ORDER — SODIUM CHLORIDE 0.9% FLUSH
3.0000 mL | Freq: Two times a day (BID) | INTRAVENOUS | Status: DC
  Administered 2015-08-17 – 2015-08-28 (×20): 3 mL via INTRAVENOUS

## 2015-08-17 MED ORDER — HYDRALAZINE HCL 50 MG PO TABS
50.0000 mg | ORAL_TABLET | Freq: Two times a day (BID) | ORAL | Status: DC
  Administered 2015-08-17 – 2015-08-25 (×16): 50 mg via ORAL
  Filled 2015-08-17 (×17): qty 1

## 2015-08-17 MED ORDER — IPRATROPIUM-ALBUTEROL 0.5-2.5 (3) MG/3ML IN SOLN
3.0000 mL | RESPIRATORY_TRACT | Status: DC
  Administered 2015-08-17: 3 mL via RESPIRATORY_TRACT
  Filled 2015-08-17: qty 3

## 2015-08-17 MED ORDER — AMLODIPINE BESYLATE 10 MG PO TABS
10.0000 mg | ORAL_TABLET | Freq: Every day | ORAL | Status: DC
  Administered 2015-08-18 – 2015-08-23 (×6): 10 mg via ORAL
  Filled 2015-08-17 (×7): qty 1

## 2015-08-17 MED ORDER — METOLAZONE 2.5 MG PO TABS: 2.5000 mg | ORAL_TABLET | ORAL | Status: DC

## 2015-08-17 MED ORDER — OXYMETAZOLINE HCL 0.05 % NA SOLN
1.0000 | Freq: Four times a day (QID) | NASAL | Status: DC | PRN
  Filled 2015-08-17: qty 15

## 2015-08-17 MED ORDER — ALBUTEROL SULFATE HFA 108 (90 BASE) MCG/ACT IN AERS
2.0000 | INHALATION_SPRAY | RESPIRATORY_TRACT | Status: DC | PRN
  Administered 2015-08-17: 2 via RESPIRATORY_TRACT
  Filled 2015-08-17: qty 6.7

## 2015-08-17 MED ORDER — SODIUM CHLORIDE 0.9 % IV SOLN
250.0000 mL | INTRAVENOUS | Status: DC | PRN
  Administered 2015-08-26: 10:00:00 via INTRAVENOUS

## 2015-08-17 MED ORDER — LEVOFLOXACIN 250 MG PO TABS: 250.0000 mg | ORAL_TABLET | Freq: Every day | ORAL | Status: DC

## 2015-08-17 MED ORDER — WARFARIN - PHARMACIST DOSING INPATIENT
Freq: Every day | Status: DC
  Administered 2015-08-20: 1

## 2015-08-17 MED ORDER — FUROSEMIDE 10 MG/ML IJ SOLN
40.0000 mg | Freq: Once | INTRAMUSCULAR | Status: AC
  Administered 2015-08-17: 40 mg via INTRAVENOUS
  Filled 2015-08-17: qty 4

## 2015-08-17 MED ORDER — FLUTICASONE PROPIONATE 50 MCG/ACT NA SUSP: 2.0000 | Freq: Every day | NASAL | Status: DC | PRN

## 2015-08-17 NOTE — Telephone Encounter (Signed)
No Uric Acid in labs. Please advise     KP

## 2015-08-17 NOTE — ED Notes (Signed)
Dr. Florina Ou into room, at [facility].

## 2015-08-17 NOTE — ED Notes (Signed)
Pt back from xray, alert, NAD, calm, interactive, increased wob noted. RT at [facility].

## 2015-08-17 NOTE — ED Notes (Signed)
He was sent from being seen by his MD with SOB and possible CHF.

## 2015-08-17 NOTE — Telephone Encounter (Signed)
Pt called at 2pm stating he has taken the increased dose of lasix and still no urinary outpt since 7/8am this morning. States he feels nauseated and feels like he is swelling. Per verbal from PCP, if he is not able to urinated in 8 hours he should proceed to the ER.  Pt voices understanding.

## 2015-08-17 NOTE — ED Notes (Addendum)
In to assess Pt.  Friend at Pt. Bedside.  Pt. Reports he has been to [facility] ED 3 times in the last week for the same issue.  Pt. In no distress with c/o swelling in his legs and feet.  Pt. Also c/o some SHOB.

## 2015-08-17 NOTE — Discharge Instructions (Signed)

## 2015-08-17 NOTE — ED Notes (Signed)
On heart monitor and pulse oximetry by EMT Dawn

## 2015-08-17 NOTE — ED Notes (Signed)
Out to car in w/c with RN

## 2015-08-17 NOTE — Progress Notes (Signed)
ANTICOAGULATION CONSULT NOTE - Initial Consult  Pharmacy Consult for Coumadin Indication: atrial fibrillation  Allergies  Allergen Reactions  . Ace Inhibitors Other (See Comments)    Worsening renal insufficiency    Patient Measurements: Height: 6' (182.9 cm) Weight: 287 lb 6.4 oz (130.364 kg) (scale B) IBW/kg (Calculated) : 77.6  Vital Signs: Temp: 97.8 F (36.6 C) (01/30 1940) Temp Source: Oral (01/30 1940) BP: 130/69 mmHg (01/30 1942) Pulse Rate: 51 (01/30 1940)  Labs:  Recent Labs  08/16/15 2350 08/17/15 1535  HGB 9.6* 9.2*  HCT 29.8* 28.7*  PLT 168 159  LABPROT 27.9*  --   INR 2.65*  --   CREATININE 3.51* 3.64*  TROPONINI 0.06* 0.08*    Estimated Creatinine Clearance: 25.6 mL/min (by C-G formula based on Cr of 3.64).   Medical History: Past Medical History  Diagnosis Date  . Nephrolithiasis   . Arthritis     rheumatoid  . Colon polyp 2000  . Atrial fibrillation ([place])   . Unspecified essential hypertension   . Gout   . CHF (congestive heart failure) ([facility])     Assessment: 74 YOM on coumadin PTA for afib, admitted with SOB, legswelling, and DOE likely from CHF exacerbation. Pharmacy is consulted to manage coumadin dosing. INR 2.65 yesterday. PTA dose 7.5 mg daily, pt reported last taken 1/29. [facility] clinic record reviewed, patient has been stable and therapeutic on current regimen.  Goal of Therapy:  INR 2-3 Monitor platelets by anticoagulation protocol: Yes   Plan:  - Coumadin 7.5 mg daily - Daily PT/INR  Maryanna Shape, PharmD, BCPS  Clinical Pharmacist  Pager: [phone]   08/17/2015,10:31 PM

## 2015-08-17 NOTE — Telephone Encounter (Signed)
I wanted to let you know that I am beginning levaquin for [name] for possible pneumonia. He is followed in your coumadin clinic.

## 2015-08-17 NOTE — Progress Notes (Signed)
Pre visit review using our clinic review tool, if applicable. No additional management support is needed unless otherwise documented below in the visit note. 

## 2015-08-17 NOTE — ED Notes (Signed)
"  ready to go", "want to go home to rest before my morning appt", "feel better". Denies pain. Increased wob remains. VSS. Inhaler in hand.

## 2015-08-17 NOTE — ED Notes (Signed)
To x-ray via stretcher.

## 2015-08-17 NOTE — Progress Notes (Signed)
Received called from Dr. Stark Jock at [facility] regarding Bruce Cole. Patient has been experiencing increase SOB, leg swelling and DOE over the las week. Seen by PCP today and referred to ED for further evaluation and treatment. Found to have mild hypoxia on RA with activity (new for him); worsening renal failure and CXR demonstrating vascular congestion, small right pleural effusion and atelectasis vs PNA. Patient accepted for further evaluation and treatment of his SOB; which appears to be 2/2 acute on chronic CHF exacerbation and causing acute on chronic renal failure due to decrease perfusion.  Plan: -accepted to telemetry bed -might need renal service consultation base on renal function trend  -and also cardiology consult for assistance with Herat failure treatment   Barton Dubois [phone]

## 2015-08-17 NOTE — Progress Notes (Signed)
Subjective:    Patient ID: Bruce Cole, male    DOB: Dec 06, 1940, 75 y.o.   MRN: [name]  HPI  [name] is a 75 yr old male who presents today for ED follow up- records are reviewed. Patient presented late last evening to the ED with chief complaint of exertional SOB and cough x 1 week.  Prior to this ED visit he was evaluated in the ED on 08/12/15 and was treated with IV lasix.  He was apparently unable to get in sooner with his cardiologist.  Labs are reviewed and are significant for anemia Hgb 9.6, troponin 0.06 and acute on chronic renal failure with a creatinine of 3.51. INR was therapeutic at 2.65. He was treated with Duoneb, IV lasix 40mg  x 2 during last night's visit.  CXR noted a small right pleural effusion and associated R basilar opacity (? Compressive atx versus PNA).    Patient reports that he is feeling much better this AM than he did prior to going to the ED last night.    Has upcoming appointment with Dr. [name].  Pt is taking furosemide 80 mg BID.    Wt Readings from Last 3 Encounters:  08/17/15 291 lb (131.997 kg)  08/12/15 280 lb (127.007 kg)  08/04/15 289 lb (131.09 kg)     Review of Systems See HPI  Past Medical History  Diagnosis Date  . Nephrolithiasis   . Arthritis     rheumatoid  . Colon polyp 2000  . Atrial fibrillation ([place])   . Unspecified essential hypertension   . Gout   . CHF (congestive heart failure) ([place])     Social History   Social History  . Marital Status: Single    Spouse Name: N/A  . Number of Children: 3  . Years of Education: N/A   Occupational History  .     Social History Main Topics  . Smoking status: Never Smoker   . Smokeless tobacco: Never Used  . Alcohol Use: Yes     Comment: occasional  . Drug Use: No  . Sexual Activity: Not on file   Other Topics Concern  . 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  Spine surgery      x 2  . Joint replacement      Total L-Hip replacement, Right Knee 10/20/09  . Cholecystectomy  1994  . Left and right heart catheterization with coronary angiogram N/A 02/22/2013    Procedure: LEFT AND RIGHT HEART CATHETERIZATION WITH CORONARY ANGIOGRAM;  Surgeon: Jolaine Artist, MD;  Location: [facility] CATH LAB;  Service: Cardiovascular;  Laterality: N/A;    Family History  Problem Relation Age of Onset  . Hypertension Mother   . Arthritis Mother     ?RA  . Hypertension Father     Allergies  Allergen Reactions  . Ace Inhibitors     Worsening renal insufficiency    Current Outpatient Prescriptions on File Prior to Visit  Medication Sig Dispense Refill  . allopurinol (ZYLOPRIM) 100 MG tablet TAKE 2 TABLETS BY MOUTH EVERY DAY 60 tablet 0  . amLODipine (NORVASC) 10 MG tablet TAKE 1 TABLET BY MOUTH EVERY DAY 30 tablet 5  . carvedilol (COREG) 12.5 MG tablet TAKE 1 TABLET BY MOUTH EVERY MORNING AND TAKE 1/2 TABLET BY MOUTH IN THE EVENING 45 tablet 3  . colchicine 0.6 MG tablet TAKE  2 TABLETS BY MOUTH AT THE START OF GOUT FLARE AND TAKE 1 TABLET 3 HOURS LATER; MAX 3 TABS PER GOUT ATTACK 30 tablet 0  . fluticasone (FLONASE) 50 MCG/ACT nasal spray Place 2 sprays into both nostrils daily. 16 g 6  . glucose blood test strip Use as to check blood sugar 1 to 2 times a day.  DX  E11.8    . hydrALAZINE (APRESOLINE) 50 MG tablet TAKE 1 TABLET BY MOUTH THREE TIMES DAILY 270 tablet 1  . HYDROcodone-acetaminophen (NORCO/VICODIN) 5-325 MG tablet Take 1-2 tablets by mouth every 6 (six) hours as needed for moderate pain. 20 tablet 0  . hydroxychloroquine (PLAQUENIL) 200 MG tablet Take 200 mg by mouth 2 (two) times daily.    . isosorbide mononitrate (IMDUR) 30 MG 24 hr tablet TAKE 1 TABLET BY MOUTH EVERY DAY 30 tablet 0  . metolazone (ZAROXOLYN) 2.5 MG tablet Take 1 tablet (2.5 mg total) by mouth 2 (two) times a week. 30 tablet 0  . Multiple Vitamins-Minerals (PX MENS MULTIVITAMINS) TABS  Take 1 tablet by mouth daily.    . ondansetron (ZOFRAN ODT) 4 MG disintegrating tablet Take 1 tablet (4 mg total) by mouth every 8 (eight) hours as needed for nausea or vomiting. 12 tablet 1  . potassium chloride SA (K-DUR,KLOR-CON) 20 MEQ tablet TAKE 1 TABLET BY MOUTH THREE TIMES DAILY 270 tablet 3  . promethazine (PHENERGAN) 25 MG tablet Take 1 tablet by mouth as needed.  1  . sildenafil (VIAGRA) 100 MG tablet Take 100 mg by mouth daily as needed for erectile dysfunction.     [name] spironolactone (ALDACTONE) 25 MG tablet TAKE 1 TABLET BY MOUTH DAILY 30 tablet 3  . terazosin (HYTRIN) 1 MG capsule TAKE 1 CAPSULE BY MOUTH EVERY NIGHT AT BEDTIME 30 capsule 5  . traMADol (ULTRAM) 50 MG tablet Take 50 mg by mouth every 6 (six) hours as needed.    . valACYclovir (VALTREX) 500 MG tablet Take 1 tablet (500 mg total) by mouth daily. 30 tablet 5  . warfarin (COUMADIN) 7.5 MG tablet TAKE 1 TABLET BY MOUTH EVERY DAILY AS DIRECTED PER COUMADIN CLINIC 40 tablet 3   No current facility-administered medications on file prior to visit.    BP 128/60 mmHg  Pulse 50  Temp(Src) 98.2 F (36.8 C) (Oral)  Resp 18  Ht 6\' 2"  (1.88 m)  Wt 291 lb (131.997 kg)  BMI 37.35 kg/m2  SpO2 96%     Objective:   Physical Exam  Constitutional: He is oriented to person, place, and time. He appears well-developed and well-nourished. No distress.  HENT:  Head: Normocephalic and atraumatic.  Cardiovascular: Normal rate and regular rhythm.   No murmur heard. Prominent JVD noted  Pulmonary/Chest: Effort normal and breath sounds normal. No respiratory distress. He has no wheezes. He has no rales.  Musculoskeletal:  2+ bilateral LE edema  Lymphadenopathy:    He has no cervical adenopathy.  Neurological: He is alert and oriented to person, place, and time.  Skin: Skin is warm and dry.  Psychiatric: He has a normal mood and affect. His behavior is normal. Thought content normal.          Assessment & Plan:  PNA/CHF  exacerbation/Chronic renal insufficiency-Pt is clinically improving s/p IV lasix but still appears volume overloaded. Add levaquin for PNA .  I did discuss his worsening renal functio and volume overload with his nephrologist- Dr. [name]. He advised doubling the furosemide from 80 mg to 160mg  bid.  He will follow up with him as scheduled on 2/5.  I also notified the PharmD in  coumadin clinic re: addition of levaquin.

## 2015-08-17 NOTE — ED Notes (Signed)
Attempted to call report to [id number]. Secretary, "Latonya" states RN is in room with pt and she will have them call back.

## 2015-08-17 NOTE — Telephone Encounter (Signed)
Thanks for letting me know - called pt to move up Coumadin appt to this Thursday after he's had 3 doses of Levaquin.

## 2015-08-17 NOTE — ED Notes (Signed)
Pt states this is his 3rd trip here. Was seen by PCP upstairs and was given meds. Today describes increased SHOB with exertion. Feet swelling and decreased urine output. Pitting edema noted to lower extremities.

## 2015-08-17 NOTE — Patient Instructions (Signed)
Increase your lasix to 160mg  twice daily (4 tabs twice daily). Start levaquin 250mg  once daily for possible pneumonia.   Keep your upcoming appointment with Dr. [name].

## 2015-08-17 NOTE — ED Provider Notes (Signed)
CSN: [id number]     Arrival date & time 08/17/15  1447 History   First MD Initiated Contact with Patient 08/17/15 1507     Chief Complaint  Patient presents with  . Shortness of Breath     (Consider location/radiation/quality/duration/timing/severity/associated sxs/prior Treatment) HPI Comments: Patient is a 75 year old male with past medical history of congestive heart failure, chronic renal insufficiency, diabetes, and obesity. He presents for evaluation of shortness of breath. He states that this is worsened over the past week. He reports dyspnea on exertion and orthopnea as well as a 4 pound weight gain during this period of time. He does report cough which is nonproductive. He denies any fevers. He denies to me he is experiencing any chest pain. He does report increased swelling to both legs.  Patient is a 75 y.o. male presenting with shortness of breath. The history is provided by the patient.  Shortness of Breath Severity:  Moderate Onset quality:  Gradual Duration:  1 week Timing:  Constant Progression:  Worsening Chronicity:  New Context: activity   Relieved by:  Rest Worsened by:  Activity and exertion Ineffective treatments:  Diuretics Associated symptoms: cough   Associated symptoms: no abdominal pain, no chest pain and no fever     Past Medical History  Diagnosis Date  . Nephrolithiasis   . Arthritis     rheumatoid  . Colon polyp 2000  . Atrial fibrillation ([place])   . Unspecified essential hypertension   . Gout   . CHF (congestive heart failure) [facility])    Past Surgical History  Procedure Laterality Date  . Spine surgery      x 2  . Joint replacement      Total L-Hip replacement, Right Knee 10/20/09  . Cholecystectomy  1994  . Left and right heart catheterization with coronary angiogram N/A 02/22/2013    Procedure: LEFT AND RIGHT HEART CATHETERIZATION WITH CORONARY ANGIOGRAM;  Surgeon: Jolaine Artist, MD;  Location: [facility] CATH LAB;  Service: Cardiovascular;   Laterality: N/A;   Family History  Problem Relation Age of Onset  . Hypertension Mother   . Arthritis Mother     ?RA  . Hypertension Father    Social History  Substance Use Topics  . Smoking status: Never Smoker   . Smokeless tobacco: Never Used  . Alcohol Use: Yes     Comment: occasional    Review of Systems  Constitutional: Negative for fever.  Respiratory: Positive for cough and shortness of breath.   Cardiovascular: Negative for chest pain.  Gastrointestinal: Negative for abdominal pain.  All other systems reviewed and are negative.     Allergies  Ace inhibitors  Home Medications   Prior to Admission medications   Medication Sig Start Date End Date Taking? Authorizing Provider  allopurinol (ZYLOPRIM) 100 MG tablet TAKE 2 TABLETS BY MOUTH EVERY DAY 07/17/15   Alferd Apa Lowne, DO  amLODipine (NORVASC) 10 MG tablet TAKE 1 TABLET BY MOUTH EVERY DAY 04/17/15   Debbrah Alar, NP  carvedilol (COREG) 12.5 MG tablet TAKE 1 TABLET BY MOUTH EVERY MORNING AND TAKE 1/2 TABLET BY MOUTH IN THE EVENING 02/13/15   Debbrah Alar, NP  colchicine 0.6 MG tablet TAKE 2 TABLETS BY MOUTH AT THE START OF GOUT FLARE AND TAKE 1 TABLET 3 HOURS LATER; MAX 3 TABS PER GOUT ATTACK 05/25/15   Debbrah Alar, NP  fluticasone (FLONASE) 50 MCG/ACT nasal spray Place 2 sprays into both nostrils daily. 03/18/15   Debbrah Alar, NP  furosemide (LASIX)  40 MG tablet Take 4 tablets (160 mg total) by mouth 2 (two) times daily. 08/17/15   Debbrah Alar, NP  glucose blood test strip Use as to check blood sugar 1 to 2 times a day.  DX  E11.8    Historical Provider, MD  hydrALAZINE (APRESOLINE) 50 MG tablet TAKE 1 TABLET BY MOUTH THREE TIMES DAILY 07/28/15   Debbrah Alar, NP  HYDROcodone-acetaminophen (NORCO/VICODIN) 5-325 MG tablet Take 1-2 tablets by mouth every 6 (six) hours as needed for moderate pain. 05/01/15   Fredia Sorrow, MD  hydroxychloroquine (PLAQUENIL) 200 MG tablet Take 200 mg  by mouth 2 (two) times daily. 09/11/14   Bo Merino, MD  isosorbide mononitrate (IMDUR) 30 MG 24 hr tablet TAKE 1 TABLET BY MOUTH EVERY DAY 08/17/15   Larey Dresser, MD  levofloxacin (LEVAQUIN) 250 MG tablet Take 1 tablet (250 mg total) by mouth daily. 08/17/15   Debbrah Alar, NP  metolazone (ZAROXOLYN) 2.5 MG tablet Take 1 tablet (2.5 mg total) by mouth 2 (two) times a week. 08/14/15   Jolaine Artist, MD  Multiple Vitamins-Minerals (PX MENS MULTIVITAMINS) TABS Take 1 tablet by mouth daily.    Historical Provider, MD  ondansetron (ZOFRAN ODT) 4 MG disintegrating tablet Take 1 tablet (4 mg total) by mouth every 8 (eight) hours as needed for nausea or vomiting. 05/01/15   Fredia Sorrow, MD  potassium chloride SA (K-DUR,KLOR-CON) 20 MEQ tablet TAKE 1 TABLET BY MOUTH THREE TIMES DAILY 07/31/15   Jolaine Artist, MD  promethazine (PHENERGAN) 25 MG tablet Take 1 tablet by mouth as needed. 05/01/15   Historical Provider, MD  sildenafil (VIAGRA) 100 MG tablet Take 100 mg by mouth daily as needed for erectile dysfunction.     Historical Provider, MD  spironolactone (ALDACTONE) 25 MG tablet TAKE 1 TABLET BY MOUTH DAILY 07/22/15   Jolaine Artist, MD  terazosin (HYTRIN) 1 MG capsule TAKE 1 CAPSULE BY MOUTH EVERY NIGHT AT BEDTIME 10/15/14   Debbrah Alar, NP  traMADol (ULTRAM) 50 MG tablet Take 50 mg by mouth every 6 (six) hours as needed.    Historical Provider, MD  valACYclovir (VALTREX) 500 MG tablet Take 1 tablet (500 mg total) by mouth daily. 11/18/13   Debbrah Alar, NP  warfarin (COUMADIN) 7.5 MG tablet TAKE 1 TABLET BY MOUTH EVERY DAILY AS DIRECTED PER COUMADIN CLINIC 02/02/15   Evans Lance, MD   BP 120/66 mmHg  Pulse 66  Temp(Src) 97.8 F (36.6 C) (Oral)  Resp 22  Ht 6\' 2"  (1.88 m)  Wt 291 lb (131.997 kg)  BMI 37.35 kg/m2  SpO2 96% Physical Exam  Constitutional: He is oriented to person, place, and time. He appears well-developed and well-nourished. No distress.   HENT:  Head: Normocephalic and atraumatic.  Neck: Normal range of motion. Neck supple.  Cardiovascular:  Heart rate is bradycardic, but regular.  Pulmonary/Chest: Effort normal and breath sounds normal. No respiratory distress. He has no wheezes. He has no rales.  Abdominal: Soft. Bowel sounds are normal. He exhibits no distension. There is no tenderness. There is no rebound and no guarding.  Musculoskeletal: Normal range of motion. He exhibits edema.  There is 2+ pitting edema of the bilateral lower extremities.  Neurological: He is alert and oriented to person, place, and time.  Skin: Skin is warm and dry. He is not diaphoretic.  Nursing note and vitals reviewed.   ED Course  Procedures (including critical care time) Labs Review Labs Reviewed  CBC WITH  DIFFERENTIAL/PLATELET  BASIC METABOLIC PANEL  BRAIN NATRIURETIC PEPTIDE  TROPONIN I    Imaging Review Dg Chest 2 View  08/17/2015  CLINICAL DATA:  Shortness of breath for 1 week. EXAM: CHEST  2 VIEW COMPARISON:  08/12/2015. FINDINGS: Development of small right pleural effusion. Associated right basilar opacity. Cardiomegaly is stable. No pulmonary edema or pneumothorax. Osseous structures are unchanged. IMPRESSION: Developed of small right pleural effusion. Associated right basilar opacity may reflect compressive atelectasis. Pneumonia could have a similar appearance in the appropriate clinical setting. Cardiomegaly is stable. Electronically Signed   By: Jeb Levering M.D.   On: 08/17/2015 01:08   I have personally reviewed and evaluated these images and lab results as part of my medical decision-making.   EKG Interpretation   Date/Time:  Monday August 17 2015 14:59:24 EST Ventricular Rate:  45 PR Interval:    QRS Duration: 91 QT Interval:  582 QTC Calculation: 504 R Axis:   -61 Text Interpretation:  Atrial fibrillation Ventricular premature complex  Inferior infarct, old Anterior infarct, old Prolonged QT interval   Confirmed by Shaka Zech  MD, Avis Mcmahill ([id number]) on 08/17/2015 3:47:15 PM      MDM   Final diagnoses:  None    Patient is a 76 year old male with extensive past medical history as per history of present illness. He presents with shortness of breath that is worsened over the past week. He reports increased leg swelling and dyspnea on exertion. His workup reveals worsening renal function. His creatinine has worsened from 2.6 to 3.6 over the past 10 days and his BUN is elevated. His chest x-ray reveals a possible infiltrate versus effusion in the right base. He will be given an additional dose of IV Lasix. As he is not febrile and there is no leukocytosis, I suspect his dyspnea is likely related to CHF.  He was found to be hypoxic with oxygen saturations dropping to 88% with minimal exertion. I've spoken with Dr. Dyann Kief from the hospitalist service who agrees to admit the patient. [facility].    Veryl Speak, MD 08/17/15 [id number]

## 2015-08-18 ENCOUNTER — Inpatient Hospital Stay (HOSPITAL_COMMUNITY): Payer: Medicare Other

## 2015-08-18 DIAGNOSIS — N183 Chronic kidney disease, stage 3 (moderate): Secondary | ICD-10-CM

## 2015-08-18 DIAGNOSIS — I482 Chronic atrial fibrillation: Secondary | ICD-10-CM

## 2015-08-18 DIAGNOSIS — N179 Acute kidney failure, unspecified: Secondary | ICD-10-CM

## 2015-08-18 DIAGNOSIS — R0600 Dyspnea, unspecified: Secondary | ICD-10-CM

## 2015-08-18 DIAGNOSIS — I5043 Acute on chronic combined systolic (congestive) and diastolic (congestive) heart failure: Secondary | ICD-10-CM

## 2015-08-18 LAB — CBC WITH DIFFERENTIAL/PLATELET
BASOS ABS: 0 10*3/uL (ref 0.0–0.1)
BASOS PCT: 0 %
EOS PCT: 2 %
Eosinophils Absolute: 0.1 10*3/uL (ref 0.0–0.7)
HEMATOCRIT: 28.9 % — AB (ref 39.0–52.0)
Hemoglobin: 9.2 g/dL — ABNORMAL LOW (ref 13.0–17.0)
LYMPHS PCT: 19 %
Lymphs Abs: 1.1 10*3/uL (ref 0.7–4.0)
MCH: 28.2 pg (ref 26.0–34.0)
MCHC: 31.8 g/dL (ref 30.0–36.0)
MCV: 88.7 fL (ref 78.0–100.0)
Monocytes Absolute: 0.4 10*3/uL (ref 0.1–1.0)
Monocytes Relative: 8 %
NEUTROS ABS: 3.9 10*3/uL (ref 1.7–7.7)
Neutrophils Relative %: 71 %
PLATELETS: 162 10*3/uL (ref 150–400)
RBC: 3.26 MIL/uL — AB (ref 4.22–5.81)
RDW: 17.4 % — ABNORMAL HIGH (ref 11.5–15.5)
WBC: 5.5 10*3/uL (ref 4.0–10.5)

## 2015-08-18 LAB — BASIC METABOLIC PANEL
ANION GAP: 13 (ref 5–15)
BUN: 91 mg/dL — ABNORMAL HIGH (ref 6–20)
CALCIUM: 10 mg/dL (ref 8.9–10.3)
CO2: 27 mmol/L (ref 22–32)
Chloride: 103 mmol/L (ref 101–111)
Creatinine, Ser: 3.7 mg/dL — ABNORMAL HIGH (ref 0.61–1.24)
GFR calc Af Amer: 17 mL/min — ABNORMAL LOW (ref >60)
GFR calc non Af Amer: 15 mL/min — ABNORMAL LOW (ref >60)
Glucose, Bld: 92 mg/dL (ref 65–99)
POTASSIUM: 3.8 mmol/L (ref 3.5–5.1)
SODIUM: 143 mmol/L (ref 135–145)

## 2015-08-18 LAB — TROPONIN I
TROPONIN I: 0.07 ng/mL — AB (ref <0.031)
TROPONIN I: 0.08 ng/mL — AB (ref <0.031)
Troponin I: 0.08 ng/mL — ABNORMAL HIGH (ref <0.031)

## 2015-08-18 LAB — PROTIME-INR
INR: 2.86 — ABNORMAL HIGH (ref 0.00–1.49)
Prothrombin Time: 29.6 seconds — ABNORMAL HIGH (ref 11.6–15.2)

## 2015-08-18 MED ORDER — ALBUTEROL SULFATE (2.5 MG/3ML) 0.083% IN NEBU: 2.5000 mg | INHALATION_SOLUTION | RESPIRATORY_TRACT | Status: DC | PRN

## 2015-08-18 MED ORDER — ALBUTEROL SULFATE (2.5 MG/3ML) 0.083% IN NEBU
2.5000 mg | INHALATION_SOLUTION | Freq: Three times a day (TID) | RESPIRATORY_TRACT | Status: DC
  Administered 2015-08-18 – 2015-08-19 (×4): 2.5 mg via RESPIRATORY_TRACT
  Filled 2015-08-18 (×4): qty 3

## 2015-08-18 MED ORDER — FUROSEMIDE 10 MG/ML IJ SOLN
80.0000 mg | Freq: Two times a day (BID) | INTRAMUSCULAR | Status: DC
  Administered 2015-08-18 (×2): 80 mg via INTRAVENOUS
  Filled 2015-08-18 (×2): qty 8

## 2015-08-18 MED ORDER — METOLAZONE 2.5 MG PO TABS: 2.5000 mg | ORAL_TABLET | Freq: Once | ORAL | Status: DC

## 2015-08-18 MED ORDER — METOLAZONE 5 MG PO TABS
5.0000 mg | ORAL_TABLET | Freq: Two times a day (BID) | ORAL | Status: DC
  Administered 2015-08-18 – 2015-08-20 (×4): 5 mg via ORAL
  Filled 2015-08-18 (×4): qty 1

## 2015-08-18 NOTE — Progress Notes (Signed)
Patient arrived from OSH accompanied by EMS, pt alert and oriented x 4, no complaints of short of breath and chest pains.  Patient hooked to telemetry box 23, reviewed packet and relevant channels for education.  Placed call bell within reach, instructed to call for concerns.

## 2015-08-18 NOTE — Discharge Instructions (Addendum)
Information on my medicine - Coumadin   (Warfarin)  This medication education was reviewed with me or my healthcare representative as part of my discharge preparation.  The pharmacist that spoke with me during my hospital stay was:  Georgina Peer, [facility]  Why was Coumadin prescribed for you? Coumadin was prescribed for you because you have a blood clot or a medical condition that can cause an increased risk of forming blood clots. Blood clots can cause serious health problems by blocking the flow of blood to the heart, lung, or brain. Coumadin can prevent harmful blood clots from forming. As a reminder your indication for Coumadin is:   Stroke Prevention Because Of Atrial Fibrillation  What test will check on my response to Coumadin? While on Coumadin (warfarin) you will need to have an INR test regularly to ensure that your dose is keeping you in the desired range. The INR (international normalized ratio) number is calculated from the result of the laboratory test called prothrombin time (PT).  If an INR APPOINTMENT HAS NOT ALREADY BEEN MADE FOR YOU please schedule an appointment to have this lab work done by your health care provider within 7 days. Your INR goal is:  2 to 3  What  do you need to  know  About  COUMADIN? Take Coumadin (warfarin) exactly as prescribed by your healthcare provider about the same time each day.  DO NOT stop taking without talking to the doctor who prescribed the medication.  Stopping without other blood clot prevention medication to take the place of Coumadin may increase your risk of developing a new clot or stroke.  Get refills before you run out.  What do you do if you miss a dose? If you miss a dose, take it as soon as you remember on the same day then continue your regularly scheduled regimen the next day.  Do not take two doses of Coumadin at the same time.  Important Safety Information A possible side effect of Coumadin (Warfarin) is an increased risk of  bleeding. You should call your healthcare provider right away if you experience any of the following: ? Bleeding from an injury or your nose that does not stop. ? Unusual colored urine (red or dark brown) or unusual colored stools (red or black). ? Unusual bruising for unknown reasons. ? A serious fall or if you hit your head (even if there is no bleeding).  Some foods or medicines interact with Coumadin (warfarin) and might alter your response to warfarin. To help avoid this: ? Eat a balanced diet, maintaining a consistent amount of Vitamin K. ? Notify your provider about major diet changes you plan to make. ? Avoid alcohol or limit your intake to 1 drink for women and 2 drinks for men per day. (1 drink is 5 oz. wine, 12 oz. beer, or 1.5 oz. liquor.)  Make sure that ANY health care provider who prescribes medication for you knows that you are taking Coumadin (warfarin).  Also make sure the healthcare provider who is monitoring your Coumadin knows when you have started a new medication including herbals and non-prescription products.  Coumadin (Warfarin)  Major Drug Interactions  Increased Warfarin Effect Decreased Warfarin Effect  Alcohol (large quantities) Antibiotics (esp. Septra/Bactrim, Flagyl, Cipro) Amiodarone (Cordarone) Aspirin (ASA) Cimetidine (Tagamet) Megestrol (Megace) NSAIDs (ibuprofen, naproxen, etc.) Piroxicam (Feldene) Propafenone (Rythmol SR) Propranolol (Inderal) Isoniazid (INH) Posaconazole (Noxafil) Barbiturates (Phenobarbital) Carbamazepine (Tegretol) Chlordiazepoxide (Librium) Cholestyramine (Questran) Griseofulvin Oral Contraceptives Rifampin Sucralfate (Carafate) Vitamin K  Coumadin (Warfarin) Major Herbal Interactions  Increased Warfarin Effect Decreased Warfarin Effect  Garlic Ginseng Ginkgo biloba Coenzyme Q10 Green tea St. Johns wort    Coumadin (Warfarin) FOOD Interactions  Eat a consistent number of servings per week of foods HIGH in  Vitamin K (1 serving =  cup)  Collards (cooked, or boiled & drained) Kale (cooked, or boiled & drained) Mustard greens (cooked, or boiled & drained) Parsley *serving size only =  cup Spinach (cooked, or boiled & drained) Swiss chard (cooked, or boiled & drained) Turnip greens (cooked, or boiled & drained)  Eat a consistent number of servings per week of foods MEDIUM-HIGH in Vitamin K (1 serving = 1 cup)  Asparagus (cooked, or boiled & drained) Broccoli (cooked, boiled & drained, or raw & chopped) Brussel sprouts (cooked, or boiled & drained) *serving size only =  cup Lettuce, raw (green leaf, endive, romaine) Spinach, raw Turnip greens, raw & chopped   These websites have more information on Coumadin (warfarin):  FailFactory.se; VeganReport.com.au;     08/26/2015 Bruce Cole [name] 08/04/1940  Surgeon(s): Bruce Posner, MD  Procedure(s): ARTERIOVENOUS (AV) FISTULA CREATION- LEFT RADIAL-CEPHALIC FISTULA  x Do not stick fistula for 12 weeks

## 2015-08-18 NOTE — Progress Notes (Signed)
[place] for Coumadin Indication: atrial fibrillation  Allergies  Allergen Reactions  . Ace Inhibitors Other (See Comments)    Worsening renal insufficiency    Patient Measurements: Height: 6' (182.9 cm) Weight: 285 lb 14.4 oz (129.683 kg) IBW/kg (Calculated) : 77.6  Vital Signs: Temp: 98.4 F (36.9 C) (01/31 0755) Temp Source: Oral (01/31 0755) BP: 98/49 mmHg (01/31 0755) Pulse Rate: 44 (01/31 0755)  Labs:  Recent Labs  08/16/15 2350 08/17/15 1535 08/17/15 2248 08/18/15 0413 08/18/15 1112  HGB 9.6* 9.2*  --  9.2*  --   HCT 29.8* 28.7*  --  28.9*  --   PLT 168 159  --  162  --   LABPROT 27.9*  --   --   --  29.6*  INR 2.65*  --   --   --  2.86*  CREATININE 3.51* 3.64*  --  3.70*  --   TROPONINI 0.06* 0.08* 0.07* 0.08* 0.08*    Estimated Creatinine Clearance: 25.1 mL/min (by C-G formula based on Cr of 3.7).   Medical History: Past Medical History  Diagnosis Date  . Nephrolithiasis   . Arthritis     rheumatoid  . Colon polyp 2000  . Atrial fibrillation ([place])   . Unspecified essential hypertension   . Gout   . CHF (congestive heart failure) ([facility])     Assessment: 74 YOM on coumadin PTA for afib, admitted with SOB, legswelling, and DOE likely from CHF exacerbation. Pharmacy is consulted to manage coumadin dosing.  INR 2.8 today. PTA dose 7.5 mg daily, pt reported per [address] coumadin clinic records. No bleeding noted this am. Hgb low likely d/t chronic kidney disease but stable.  Goal of Therapy:  INR 2-3 Monitor platelets by anticoagulation protocol: Yes   Plan:  - Continue Coumadin 7.5 mg daily - Daily PT/INR  Erin Hearing PharmD., BCPS Clinical Pharmacist Pager [phone] 08/18/2015 1:15 PM

## 2015-08-18 NOTE — Progress Notes (Signed)
  Echocardiogram 2D Echocardiogram has been performed.  Bobbye Charleston 08/18/2015, 4:43 PM

## 2015-08-18 NOTE — Progress Notes (Signed)
Opened in error

## 2015-08-18 NOTE — Consult Note (Signed)
Advanced Heart Failure Team Consult Note  Referring Physician: Dr Allyson Sabal Primary Physician: Debbrah Alar, NP Primary Cardiologist:  Dr. Haroldine Laws   Reason for Consultation: A/C diastolic HF  HPI:    Bruce Cole is a 76 y.o. male with history of chronic afib on chronic coumadin therapy, HTN, diastolic/RV failure, CKD, obesity, and RA who presented to [facility] 08/17/15 with worsening SOB and 4 lb weight gain with peripheral edema x 1 week.  Also has non productive cough. No CP. Pertinent admission labs include K 4.0, Creatinine 3.51, BNP 204.1, Troponin 0.08 and flat. CXR showed persistent densities at the right lung base, ? combination of consolidation and pleural fluid. Recommend CT if aeration does not improve.   Was seen in ED 08/12/15 and 08/16/15 with SOB. Received IV lasix 08/12/15. Received breathing treatment 08/16/15 and felt much better.  Had family medicine follow up 08/17/15. The NP there spoke with Dr. [name] and it was decided to increase his lasix to 160 mg BID with renal follow up 08/23/15. Was also started on levaquin for ? PNA.   Last seen in HF clinic 12/2014. Was doing very well, scheduled for 6 month follow up with Echo. Weight at that time 283.  States he returned to ED as he was still retaining fluid. Drinks > 2L daily.  Watches his salt. Has not weighed himself daily in quite some time. Had been taking all medications as directed. He was taking lasix 80 mg BID (although 160 mg BID had been ordered). He has not been peeing as much with his lasix. States he has on going hip pain and is trying to get his heart sorted out so that he can have hip surgery. + Orthopnea.  No SOB at rest or with ADLs.  Would be short of breath walking down a hallway. Occasionally gets lightheaded when he gets SOB. States he has had increased swelling. No CP or palpitations. No clear insult that brought on these events.  Review of Systems: [y] = yes, [ ]  = no   General: Weight gain [y]; Weight loss  [ ] ; Anorexia [ ] ; Fatigue [ ] ; Fever [ ] ; Chills [ ] ; Weakness [y]  Cardiac: Chest pain/pressure [ ] ; Resting SOB [ ] ; Exertional SOB [y]; Orthopnea [y]; Pedal Edema [y]; Palpitations [ ] ; Syncope [ ] ; Presyncope [ ] ; Paroxysmal nocturnal dyspnea[ ]   Pulmonary: Cough [y]; Wheezing[ ] ; Hemoptysis[ ] ; Sputum [y]; Snoring [ ]   GI: Vomiting[ ] ; Dysphagia[ ] ; Melena[ ] ; Hematochezia [ ] ; Heartburn[ ] ; Abdominal pain [ ] ; Constipation [ ] ; Diarrhea [ ] ; BRBPR [ ]   GU: Hematuria[ ] ; Dysuria [ ] ; Nocturia[ ]   Vascular: Pain in legs with walking [ ] ; Pain in feet with lying flat [ ] ; Non-healing sores [ ] ; Stroke [ ] ; TIA [ ] ; Slurred speech [ ] ;  Neuro: Headaches[ ] ; Vertigo[ ] ; Seizures[ ] ; Paresthesias[ ] ;Blurred vision [ ] ; Diplopia [ ] ; Vision changes [ ]   Ortho/Skin: Arthritis [y]; Joint pain [y]; Muscle pain [ ] ; Joint swelling [ ] ; Back Pain [ ] ; Rash [ ]   Psych: Depression[ ] ; Anxiety[ ]   Heme: Bleeding problems [ ] ; Clotting disorders [ ] ; Anemia [ ]   Endocrine: Diabetes [ ] ; Thyroid dysfunction[ ]   Home Medications Prior to Admission medications   Medication Sig Start Date End Date Taking? Authorizing Provider  albuterol (PROVENTIL) (2.5 MG/3ML) 0.083% nebulizer solution Take 2.5 mg by nebulization every 4 (four) hours.   Yes Historical Provider, MD  allopurinol (ZYLOPRIM) 100 MG tablet  TAKE 2 TABLETS BY MOUTH EVERY DAY Patient taking differently: TAKE 1 TABLETS BY MOUTH TWICE DAILY 07/17/15  Yes Yvonne R Lowne, DO  amLODipine (NORVASC) 10 MG tablet TAKE 1 TABLET BY MOUTH EVERY DAY 04/17/15  Yes Debbrah Alar, NP  carvedilol (COREG) 12.5 MG tablet TAKE 1 TABLET BY MOUTH EVERY MORNING AND TAKE 1/2 TABLET BY MOUTH IN THE EVENING Patient taking differently: TAKE 1/2 TABLET BY MOUTH TWICE DAILY 02/13/15  Yes Debbrah Alar, NP  colchicine 0.6 MG tablet TAKE 2 TABLETS BY MOUTH AT THE START OF GOUT FLARE AND TAKE 1 TABLET 3 HOURS LATER; MAX 3 TABS PER GOUT ATTACK 05/25/15  Yes Debbrah Alar, NP  fluticasone (FLONASE) 50 MCG/ACT nasal spray Place 2 sprays into both nostrils daily. Patient taking differently: Place 2 sprays into both nostrils daily as needed (seasonal allergies).  03/18/15  Yes Debbrah Alar, NP  furosemide (LASIX) 40 MG tablet Take 4 tablets (160 mg total) by mouth 2 (two) times daily. Patient taking differently: Take 80 mg by mouth 2 (two) times daily.  08/17/15  Yes Debbrah Alar, NP  guaiFENesin (MUCINEX) 600 MG 12 hr tablet Take 600 mg by mouth 2 (two) times daily.   Yes Historical Provider, MD  hydrALAZINE (APRESOLINE) 50 MG tablet TAKE 1 TABLET BY MOUTH THREE TIMES DAILY Patient taking differently: TAKE 1 TABLET BY MOUTH TWO TIMES DAILY 07/28/15  Yes Debbrah Alar, NP  hydroxychloroquine (PLAQUENIL) 200 MG tablet Take 200 mg by mouth 2 (two) times daily. 09/11/14  Yes Bo Merino, MD  isosorbide mononitrate (IMDUR) 30 MG 24 hr tablet TAKE 1 TABLET BY MOUTH EVERY DAY 08/17/15  Yes Larey Dresser, MD  metolazone (ZAROXOLYN) 2.5 MG tablet Take 1 tablet (2.5 mg total) by mouth 2 (two) times a week. Patient taking differently: Take 2.5 mg by mouth 2 (two) times a week. Tuesday and Thursday 08/14/15  Yes Jolaine Artist, MD  Multiple Vitamin (MULTIVITAMIN WITH MINERALS) TABS tablet Take 1 tablet by mouth daily.   Yes Historical Provider, MD  ondansetron (ZOFRAN ODT) 4 MG disintegrating tablet Take 1 tablet (4 mg total) by mouth every 8 (eight) hours as needed for nausea or vomiting. 05/01/15  Yes Fredia Sorrow, MD  Oxymetazoline HCl (MUCINEX NASAL SPRAY MOISTURE NA) Place 1 spray into both nostrils 4 (four) times daily as needed (congestion).   Yes Historical Provider, MD  potassium chloride SA (K-DUR,KLOR-CON) 20 MEQ tablet TAKE 1 TABLET BY MOUTH THREE TIMES DAILY Patient taking differently: TAKE 1 TABLET BY MOUTH TWO TIMES DAILY 07/31/15  Yes Jolaine Artist, MD  PRESCRIPTION MEDICATION Inhale 1 vial into the lungs every 4 (four) hours  as needed (shortness of breath or wheezing).   Yes Historical Provider, MD  spironolactone (ALDACTONE) 25 MG tablet TAKE 1 TABLET BY MOUTH DAILY 07/22/15  Yes Jolaine Artist, MD  terazosin (HYTRIN) 1 MG capsule TAKE 1 CAPSULE BY MOUTH EVERY NIGHT AT BEDTIME 10/15/14  Yes Debbrah Alar, NP  warfarin (COUMADIN) 7.5 MG tablet TAKE 1 TABLET BY MOUTH EVERY DAILY AS DIRECTED PER COUMADIN CLINIC Patient taking differently: TAKE 1 TABLET BY MOUTH EVERY DAILY AT BEDTIME OR AS DIRECTED PER COUMADIN CLINIC 02/02/15  Yes Evans Lance, MD  glucose blood test strip Use as to check blood sugar 1 to 2 times a day.  DX  E11.8    Historical Provider, MD  HYDROcodone-acetaminophen (NORCO/VICODIN) 5-325 MG tablet Take 1-2 tablets by mouth every 6 (six) hours as needed for moderate pain. Patient  not taking: Reported on 08/17/2015 05/01/15   Fredia Sorrow, MD  levofloxacin (LEVAQUIN) 250 MG tablet Take 1 tablet (250 mg total) by mouth daily. 08/17/15   Debbrah Alar, NP    Past Medical History: Past Medical History  Diagnosis Date  . Nephrolithiasis   . Arthritis     rheumatoid  . Colon polyp 2000  . Atrial fibrillation ([place])   . Unspecified essential hypertension   . Gout   . CHF (congestive heart failure) ([place])     Past Surgical History: Past Surgical History  Procedure Laterality Date  . Spine surgery      x 2  . Joint replacement      Total L-Hip replacement, Right Knee 10/20/09  . Cholecystectomy  1994  . Left and right heart catheterization with coronary angiogram N/A 02/22/2013    Procedure: LEFT AND RIGHT HEART CATHETERIZATION WITH CORONARY ANGIOGRAM;  Surgeon: Jolaine Artist, MD;  Location[facility] CATH LAB;  Service: Cardiovascular;  Laterality: N/A;    Family History: Family History  Problem Relation Age of Onset  . Hypertension Mother   . Arthritis Mother     ?RA  . Hypertension Father     Social History: Social History   Social History  . Marital Status: Single     Spouse Name: N/A  . Number of Children: 3  . Years of Education: N/A   Occupational History  .     Social History Main Topics  . Smoking status: Never Smoker   . Smokeless tobacco: Never Used  . Alcohol Use: Yes     Comment: occasional  . Drug Use: No  . Sexual Activity: Not Asked   Other Topics Concern  . None   Social History Narrative   Former Clinical biochemist and [place]    Allergies:  Allergies  Allergen Reactions  . Ace Inhibitors Other (See Comments)    Worsening renal insufficiency    Objective:    Vital Signs:   Temp:  [97.8 F (36.6 C)-98.4 F (36.9 C)] 98.4 F (36.9 C) (01/31 0755) Pulse Rate:  [41-90] 44 (01/31 0755) Resp:  [16-25] 20 (01/31 0755) BP: (98-130)/(47-69) 98/49 mmHg (01/31 0755) SpO2:  [93 %-100 %] 100 % (01/31 0755) Weight:  [285 lb 14.4 oz (129.683 kg)-291 lb (131.997 kg)] 285 lb 14.4 oz (129.683 kg) (01/31 0455) Last BM Date: 08/16/15  Weight change: Filed Weights   08/17/15 1452 08/17/15 1942 08/18/15 0455  Weight: 291 lb (131.997 kg) 287 lb 6.4 oz (130.364 kg) 285 lb 14.4 oz (129.683 kg)    Intake/Output:   Intake/Output Summary (Last 24 hours) at 08/18/15 0944 Last data filed at 08/18/15 0912  Gross per 24 hour  Intake    440 ml  Output   1500 ml  Net  -1060 ml     Physical Exam: General: Well appearing. No resp difficulty. Lying in bed HEENT: normal Neck: supple. JVP to jaw. Carotids 2+ bilaterally; no bruits. No thyromegaly or nodule noted. Cor: PMI normal. Bradycardic, regular rate & rhythm. No rubs, gallops. 2/6 early SEM RUSB Lungs: Markedly diminished bases Abdomen: soft, NT, ND, no HSM. No bruits or masses. +BS  Extremities: no cyanosis, clubbing, rash. 2+ peripheral edema into thighs.  Neuro: alert & orientedx3, cranial nerves grossly intact. Moves all 4 extremities w/o difficulty. Affect pleasant.  Telemetry:  Reviewed personally, afib 50s  Labs: Basic Metabolic Panel:  Recent Labs Lab  08/12/15 1930 08/16/15 2350 08/17/15 1535 08/18/15 0413  NA 138 139  139 143  K 4.3 4.0 4.0 3.8  CL 102 101 102 103  CO2 26 24 24 27   GLUCOSE 127* 126* 126* 92  BUN 93* 89* 91* 91*  CREATININE 3.35* 3.51* 3.64* 3.70*  CALCIUM 9.5 10.0 9.6 10.0    Liver Function Tests: No results for input(s): AST, ALT, ALKPHOS, BILITOT, PROT, ALBUMIN in the last 168 hours. No results for input(s): LIPASE, AMYLASE in the last 168 hours. No results for input(s): AMMONIA in the last 168 hours.  CBC:  Recent Labs Lab 08/12/15 1930 08/16/15 2350 08/17/15 1535 08/18/15 0413  WBC 6.3 5.8 6.1 5.5  NEUTROABS 4.4 4.0 4.8 3.9  HGB 9.5* 9.6* 9.2* 9.2*  HCT 29.5* 29.8* 28.7* 28.9*  MCV 89.7 89.5 89.1 88.7  PLT 165 168 159 162    Cardiac Enzymes:  Recent Labs Lab 08/12/15 1930 08/16/15 2350 08/17/15 1535 08/17/15 2248 08/18/15 0413  TROPONINI 0.07* 0.06* 0.08* 0.07* 0.08*    BNP: BNP (last 3 results)  Recent Labs  08/12/15 1930 08/16/15 2350 08/17/15 1535  BNP 178.6* 174.2* 204.1*    ProBNP (last 3 results) No results for input(s): PROBNP in the last 8760 hours.   CBG: No results for input(s): GLUCAP in the last 168 hours.  Coagulation Studies:  Recent Labs  08/16/15 2350  LABPROT 27.9*  INR 2.65*    Other results: EKG: 08/17/15 Afib 40s  Imaging: Dg Chest 2 View  08/17/2015  CLINICAL DATA:  Shortness of breath for 1 week. EXAM: CHEST  2 VIEW COMPARISON:  08/12/2015. FINDINGS: Development of small right pleural effusion. Associated right basilar opacity. Cardiomegaly is stable. No pulmonary edema or pneumothorax. Osseous structures are unchanged. IMPRESSION: Developed of small right pleural effusion. Associated right basilar opacity may reflect compressive atelectasis. Pneumonia could have a similar appearance in the appropriate clinical setting. Cardiomegaly is stable. Electronically Signed   By: Jeb Levering M.D.   On: 08/17/2015 01:08   Dg Chest 2v Repeat Same  Day  08/17/2015  CLINICAL DATA:  Increased shortness of breath and leg swelling. EXAM: CHEST  2 VIEW COMPARISON:  08/17/2015 and 08/12/2015 and 04/15/2015 FINDINGS: Again noted are right basilar chest densities and suspect elevation of the right hemidiaphragm. There may be compressive atelectasis or airspace disease along the medial right lung base. Left lung remains clear. Heart and mediastinum are stable. Stable mild cardiomegaly. IMPRESSION: Persistent densities at the right lung base, particularly along the medial right lung base. Findings could represent a combination of consolidation and pleural fluid. This represents a significant change from the examination in 2016. Recommend follow-up to ensure improved aeration at the right lung base. If aeration does not improve, recommend further characterization with CT. Electronically Signed   By: Markus Daft M.D.   On: 08/17/2015 16:13      Medications:     Current Medications: . albuterol  2.5 mg Nebulization TID  . allopurinol  200 mg Oral Daily  . amLODipine  10 mg Oral Daily  . carvedilol  6.25 mg Oral BID  . guaiFENesin  600 mg Oral BID  . hydrALAZINE  50 mg Oral BID  . hydroxychloroquine  200 mg Oral BID  . isosorbide mononitrate  30 mg Oral Daily  . [START ON 08/20/2015] metolazone  2.5 mg Oral Once per day on Mon Thu  . multivitamin with minerals  1 tablet Oral Daily  . sodium chloride flush  3 mL Intravenous Q12H  . spironolactone  25 mg Oral Daily  . terazosin  1 mg Oral QHS  . warfarin  7.5 mg Oral q1800  . Warfarin - Pharmacist Dosing Inpatient   Does not apply q1800     Infusions:      Assessment   1. Chronic combined HF - Echo 03/2013 45-50% with grade 2 DD, PA peak 60 mm Hg 2. AKI on CKD III 3. Chronic afib on coumadin 4. Pleural effusion on right  Plan    He is volume overloaded on exam.  Creatinine has been increasing as of late. Dr [name] aware and recommended increasing daily lasix to 160 mg BID on 08/17/15.  He  had follow up scheduled for 08/23/15.  May be advantageous to have him seen by renal in hospital.  PCP had started on levaquin for possible PNA. Per primary.   Will place on 80 mg IV lasix BID and gauge response. No K supp with AKI  Repeat Echo pending.   Pressure soft. Will watch closely.  Pt was still in to receive spiro. Stopped with Cr > 2.5.   Length of Stay: 1  Shirley Friar PA-C 08/18/2015, 9:44 AM  Advanced Heart Failure Team Pager [phone] (M-F; 7a - 4p)  Please contact [facility] for night-coverage after hours (4p -7a ) and weekends on [email or web address]  Patient seen and examined with Oda Kilts, PA-C. We discussed all aspects of the encounter. I agree with the assessment and plan as stated above.   He has significant volume overload in the setting of progressive renal failure. Renal u/s with significant medicorenal disease. Only minimal response to lasix 80 iv bid. Will add metolazone. Repeat echo pending. We discussed the fact that he may be heading toward HD. May be reasonable to consider vein mapping with access placement soon.  CXR reviewed personally. Moderate to large right effusion (possible complicated). Will get non-contrast chest CT.  Faige Seely,MD 2:58 PM

## 2015-08-18 NOTE — Progress Notes (Signed)
Patient having irregular runs of rhythm on telemetry.  QRS is wide, however the rate is slow.  Patient asymptomatic and vital signs stable.  Jonni Sanger, [place] made aware.  Will continue to monitor.

## 2015-08-18 NOTE — Progress Notes (Signed)
Utilization review completed. Ruthann Angulo, RN, BSN. 

## 2015-08-18 NOTE — H&P (Signed)
[facility] History and Physical  Bruce Cole [name] DOB: 09/01/1940 DOA: 08/17/2015  Referring physician: [facility] ED PCP: Nance Pear., NP   Chief Complaint: Retaining fluid  HPI: Bruce Cole is a 75 year old male with a past medical history significant for HTN, A. fib on Coumadin, CHF, and CKD; who presented with increasing shortness of breath, leg swelling, and dyspnea on exertion over Bruce last week. Cole was seen by his primary care provider today and referred to Bruce emergency department. Cole notes a 7- 8 pound weight gain despite being on Lasix 80 mg twice a day. Cole reports having a cough which is nonproductive. Denies any chest pain, nausea, vomiting, fever, or chills. Bruce Cole also states that he's had difficulty urinating over this last week as well. Review of Cole's last echocardiogram back in 03/2013 showed EF of 45-50% with grade 2 diastolic dysfunction. He notes that he's had congestive heart failure for 3-4 years, but had been treated  as an outpatient.  Upon admission to Bruce emergency department Cole was evaluated and seen have elevated BNP at 204.1. Creatinine function have progressively deteriorated and was noted to be 2.59 on 08/04/2015, and today elevated at 3.64. Cole had been given 30 mg of IV Lasix on Bruce 29th when he went to Bruce emergency department, and then again on Bruce 30th symptoms were not improved prior to being transferred to [facility] for admission.   Review of Systems  Constitutional: Positive for malaise/fatigue. Negative for chills.  Eyes: Negative for pain and discharge.  Respiratory: Positive for shortness of breath. Negative for hemoptysis.   Cardiovascular: Positive for leg swelling. Negative for chest pain.  Gastrointestinal: Positive for abdominal pain. Negative for nausea and vomiting.  Genitourinary: Negative for urgency and frequency.  Musculoskeletal: Positive for joint pain. Negative for falls.  Skin:  Negative for itching and rash.  Neurological: Negative for sensory change and speech change.  Endo/Heme/Allergies: Negative for environmental allergies and polydipsia.  Psychiatric/Behavioral: Negative for hallucinations. Bruce Cole is not nervous/anxious.          Past Medical History  Diagnosis Date  . Nephrolithiasis   . Arthritis     rheumatoid  . Colon polyp 2000  . Atrial fibrillation ([place])   . Unspecified essential hypertension   . Gout   . CHF (congestive heart failure) [facility])      Past Surgical History  Procedure Laterality Date  . Spine surgery      x 2  . Joint replacement      Total L-Hip replacement, Right Knee 10/20/09  . Cholecystectomy  1994  . Left and right heart catheterization with coronary angiogram N/A 02/22/2013    Procedure: LEFT AND RIGHT HEART CATHETERIZATION WITH CORONARY ANGIOGRAM;  Surgeon: Jolaine Artist, MD;  Location: [facility] CATH LAB;  Service: Cardiovascular;  Laterality: N/A;      Social History:  reports that he has never smoked. He has never used smokeless tobacco. He reports that he drinks alcohol. He reports that he does not use illicit drugs.   Allergies  Allergen Reactions  . Ace Inhibitors Other (See Comments)    Worsening renal insufficiency    Family History  Problem Relation Age of Onset  . Hypertension Mother   . Arthritis Mother     ?RA  . Hypertension Father         Prior to Admission medications   Medication Sig Start Date End Date Taking? Authorizing Provider  albuterol (PROVENTIL) (2.5 MG/3ML) 0.083% nebulizer solution Take 2.5  mg by nebulization every 4 (four) hours.   Yes Historical Provider, MD  allopurinol (ZYLOPRIM) 100 MG tablet TAKE 2 TABLETS BY MOUTH EVERY DAY Cole taking differently: TAKE 1 TABLETS BY MOUTH TWICE DAILY 07/17/15  Yes Yvonne R Lowne, DO  amLODipine (NORVASC) 10 MG tablet TAKE 1 TABLET BY MOUTH EVERY DAY 04/17/15  Yes Debbrah Alar, NP  carvedilol (COREG) 12.5 MG tablet TAKE 1  TABLET BY MOUTH EVERY MORNING AND TAKE 1/2 TABLET BY MOUTH IN Bruce EVENING Cole taking differently: TAKE 1/2 TABLET BY MOUTH TWICE DAILY 02/13/15  Yes Debbrah Alar, NP  colchicine 0.6 MG tablet TAKE 2 TABLETS BY MOUTH AT Bruce START OF GOUT FLARE AND TAKE 1 TABLET 3 HOURS LATER; MAX 3 TABS PER GOUT ATTACK 05/25/15  Yes Debbrah Alar, NP  fluticasone (FLONASE) 50 MCG/ACT nasal spray Place 2 sprays into both nostrils daily. Cole taking differently: Place 2 sprays into both nostrils daily as needed (seasonal allergies).  03/18/15  Yes Debbrah Alar, NP  furosemide (LASIX) 40 MG tablet Take 4 tablets (160 mg total) by mouth 2 (two) times daily. Cole taking differently: Take 80 mg by mouth 2 (two) times daily.  08/17/15  Yes Debbrah Alar, NP  guaiFENesin (MUCINEX) 600 MG 12 hr tablet Take 600 mg by mouth 2 (two) times daily.   Yes Historical Provider, MD  hydrALAZINE (APRESOLINE) 50 MG tablet TAKE 1 TABLET BY MOUTH THREE TIMES DAILY Cole taking differently: TAKE 1 TABLET BY MOUTH TWO TIMES DAILY 07/28/15  Yes Debbrah Alar, NP  hydroxychloroquine (PLAQUENIL) 200 MG tablet Take 200 mg by mouth 2 (two) times daily. 09/11/14  Yes Bo Merino, MD  isosorbide mononitrate (IMDUR) 30 MG 24 hr tablet TAKE 1 TABLET BY MOUTH EVERY DAY 08/17/15  Yes Larey Dresser, MD  metolazone (ZAROXOLYN) 2.5 MG tablet Take 1 tablet (2.5 mg total) by mouth 2 (two) times a week. Cole taking differently: Take 2.5 mg by mouth 2 (two) times a week. Tuesday and Thursday 08/14/15  Yes Jolaine Artist, MD  Multiple Vitamin (MULTIVITAMIN WITH MINERALS) TABS tablet Take 1 tablet by mouth daily.   Yes Historical Provider, MD  ondansetron (ZOFRAN ODT) 4 MG disintegrating tablet Take 1 tablet (4 mg total) by mouth every 8 (eight) hours as needed for nausea or vomiting. 05/01/15  Yes Fredia Sorrow, MD  Oxymetazoline HCl (MUCINEX NASAL SPRAY MOISTURE NA) Place 1 spray into both nostrils 4 (four) times  daily as needed (congestion).   Yes Historical Provider, MD  potassium chloride SA (K-DUR,KLOR-CON) 20 MEQ tablet TAKE 1 TABLET BY MOUTH THREE TIMES DAILY Cole taking differently: TAKE 1 TABLET BY MOUTH TWO TIMES DAILY 07/31/15  Yes Jolaine Artist, MD  PRESCRIPTION MEDICATION Inhale 1 vial into Bruce lungs every 4 (four) hours as needed (shortness of breath or wheezing).   Yes Historical Provider, MD  spironolactone (ALDACTONE) 25 MG tablet TAKE 1 TABLET BY MOUTH DAILY 07/22/15  Yes Jolaine Artist, MD  terazosin (HYTRIN) 1 MG capsule TAKE 1 CAPSULE BY MOUTH EVERY NIGHT AT BEDTIME 10/15/14  Yes Debbrah Alar, NP  warfarin (COUMADIN) 7.5 MG tablet TAKE 1 TABLET BY MOUTH EVERY DAILY AS DIRECTED PER COUMADIN CLINIC Cole taking differently: TAKE 1 TABLET BY MOUTH EVERY DAILY AT BEDTIME OR AS DIRECTED PER COUMADIN CLINIC 02/02/15  Yes Evans Lance, MD  glucose blood test strip Use as to check blood sugar 1 to 2 times a day.  DX  E11.8    Historical Provider, MD  HYDROcodone-acetaminophen (  NORCO/VICODIN) 5-325 MG tablet Take 1-2 tablets by mouth every 6 (six) hours as needed for moderate pain. Cole not taking: Reported on 08/17/2015 05/01/15   Fredia Sorrow, MD  levofloxacin (LEVAQUIN) 250 MG tablet Take 1 tablet (250 mg total) by mouth daily. 08/17/15   Debbrah Alar, NP     Physical Exam: Filed Vitals:   08/17/15 1942 08/18/15 0025 08/18/15 0121 08/18/15 0455  BP: 130/69  102/53 113/55  Pulse: 51  41 47  Temp: 97.8 F (36.6 C)  98.3 F (36.8 C) 98.3 F (36.8 C)  TempSrc: Oral  Oral Oral  Resp: 20  20 20   Height: 6' (1.829 m)     Weight: 130.364 kg (287 lb 6.4 oz)   129.683 kg (285 lb 14.4 oz)  SpO2: 93% 96% 96% 98%     Constitutional: Vital signs reviewed. Cole is Head: Normocephalic and atraumatic  Ear: TM normal bilaterally  Mouth: no erythema or exudates, MMM  Eyes: PERRL, EOMI, conjunctivae normal, No scleral icterus.  Neck: Supple, Trachea midline normal  ROM, No JVD, mass, thyromegaly, or carotid bruit present.  Cardiovascular: Irregular irregular Pulmonary/Chest: Decreased air movement with no expiratory wheezes appreciated or rhonchi Abdominal: Soft. Non-tender, non-distended, bowel sounds are normal, no masses, organomegaly, or guarding present.  GU: no CVA tenderness Musculoskeletal: No joint deformities, erythema, or stiffness, ROM full and no nontender Ext: 2+ pitting edema of Bruce bilateral lower extremities and no cyanosis, pulses palpable bilaterally (DP and PT)  Hematology: no cervical, inginal, or axillary adenopathy.  Neurological: A&O x3, Strenght is normal and symmetric bilaterally, cranial nerve II-XII are grossly intact, no focal motor deficit, sensory intact to light touch bilaterally.  Skin: Warm, dry and intact. No rash, cyanosis, or clubbing.  Psychiatric: Normal mood and affect. speech and behavior is normal. Judgment and thought content normal. Cognition and memory are normal.      Data Review   Micro Results No results found for this or any previous visit (from Bruce past 240 hour(s)).  Radiology Reports Dg Chest 2 View  08/17/2015  CLINICAL DATA:  Shortness of breath for 1 week. EXAM: CHEST  2 VIEW COMPARISON:  08/12/2015. FINDINGS: Development of small right pleural effusion. Associated right basilar opacity. Cardiomegaly is stable. No pulmonary edema or pneumothorax. Osseous structures are unchanged. IMPRESSION: Developed of small right pleural effusion. Associated right basilar opacity may reflect compressive atelectasis. Pneumonia could have a similar appearance in Bruce appropriate clinical setting. Cardiomegaly is stable. Electronically Signed   By: Jeb Levering M.D.   On: 08/17/2015 01:08   Dg Chest 2 View  08/12/2015  CLINICAL DATA:  Shortness of breath for 2 days. Difficulty urinating. Initial encounter. EXAM: CHEST  2 VIEW COMPARISON:  PA and lateral chest 04/15/2015. FINDINGS: There is marked cardiomegaly  without pulmonary edema. No pneumothorax or pleural effusion is identified. IMPRESSION: Cardiomegaly without acute disease. Electronically Signed   By: Inge Rise M.D.   On: 08/12/2015 20:05   Dg Chest 2v Repeat Same Day  08/17/2015  CLINICAL DATA:  Increased shortness of breath and leg swelling. EXAM: CHEST  2 VIEW COMPARISON:  08/17/2015 and 08/12/2015 and 04/15/2015 FINDINGS: Again noted are right basilar chest densities and suspect elevation of Bruce right hemidiaphragm. There may be compressive atelectasis or airspace disease along Bruce medial right lung base. Left lung remains clear. Heart and mediastinum are stable. Stable mild cardiomegaly. IMPRESSION: Persistent densities at Bruce right lung base, particularly along Bruce medial right lung base. Findings could represent a combination  of consolidation and pleural fluid. This represents a significant change from Bruce examination in 2016. Recommend follow-up to ensure improved aeration at Bruce right lung base. If aeration does not improve, recommend further characterization with CT. Electronically Signed   By: Markus Daft M.D.   On: 08/17/2015 16:13     CBC  Recent Labs Lab 08/12/15 1930 08/16/15 2350 08/17/15 1535 08/18/15 0413  WBC 6.3 5.8 6.1 5.5  HGB 9.5* 9.6* 9.2* 9.2*  HCT 29.5* 29.8* 28.7* 28.9*  PLT 165 168 159 162  MCV 89.7 89.5 89.1 88.7  MCH 28.9 28.8 28.6 28.2  MCHC 32.2 32.2 32.1 31.8  RDW 17.1* 17.5* 17.7* 17.4*  LYMPHSABS 1.0 1.1 0.7 1.1  MONOABS 0.7 0.6 0.5 0.4  EOSABS 0.2 0.1 0.1 0.1  BASOSABS 0.0 0.0 0.0 0.0    Chemistries   Recent Labs Lab 08/12/15 1930 08/16/15 2350 08/17/15 1535 08/18/15 0413  NA 138 139 139 143  K 4.3 4.0 4.0 3.8  CL 102 101 102 103  CO2 26 24 24 27   GLUCOSE 127* 126* 126* 92  BUN 93* 89* 91* 91*  CREATININE 3.35* 3.51* 3.64* 3.70*  CALCIUM 9.5 10.0 9.6 10.0    ------------------------------------------------------------------------------------------------------------------ estimated creatinine clearance is 25.1 mL/min (by C-G formula based on Cr of 3.7). ------------------------------------------------------------------------------------------------------------------ No results for input(s): HGBA1C in Bruce last 72 hours. ------------------------------------------------------------------------------------------------------------------ No results for input(s): CHOL, HDL, LDLCALC, TRIG, CHOLHDL, LDLDIRECT in Bruce last 72 hours. ------------------------------------------------------------------------------------------------------------------ No results for input(s): TSH, T4TOTAL, T3FREE, THYROIDAB in Bruce last 72 hours.  Invalid input(s): FREET3 ------------------------------------------------------------------------------------------------------------------ No results for input(s): VITAMINB12, FOLATE, FERRITIN, TIBC, IRON, RETICCTPCT in Bruce last 72 hours.  Coagulation profile  Recent Labs Lab 08/12/15 1930 08/16/15 2350  INR 2.23* 2.65*    No results for input(s): DDIMER in Bruce last 72 hours.  Cardiac Enzymes  Recent Labs Lab 08/17/15 1535 08/17/15 2248 08/18/15 0413  TROPONINI 0.08* 0.07* 0.08*   ------------------------------------------------------------------------------------------------------------------ Invalid input(s): POCBNP   CBG: No results for input(s): GLUCAP in Bruce last 168 hours.     EKG: Independently reviewed. Atrial fibrillation   Assessment/Plan  Acute on chronic combined systolic and diastolic CHF exacerbation [facility]): Cole with increased weight gain not relieved by po Lasix. Cole with +2 pitting edema lower extremity. BNP elevated at 204.1 on admission. Last EF 45-50% showing combined systolic and diastolic dysfunction back in 03/2013. - Admit to telemetry bed - Strict ins and outs - Consult  cardiology Cole is seen by Dr. Kennieth Francois - torosemide 20 mg 1 dose now - Echocardiogram in a.m. - Continue metolazone, isosorbide mononitrate, hydralazine  Acute renal failure on chronic kidney disease stage III: Cole's kidney function has progressively worsened over Bruce last 2 weeks from 2.59 on 1/17  to 3.64 on admission. - Renal ultrasound - Would recommend consult nephrology in a.m.  Atrial fibrillation on Coumadin: INR therapeutic at 2.65 in Cole appears to be rate controlled at this time. Chadsvasc score 4. - Consult pharmacy to dose Coumadin  Hypertension -Continue amlodipine, Coreg, and other blood pressure medications as listed above  Question of acute urinary retention - Check bladder scan and place Foley if greater than 300 mL of fluid present  Code Status:   full Family Communication: bedside Disposition Plan: admit   Total time spent 55 minutes.Greater than 50% of this time was spent in counseling, explanation of diagnosis, planning of further management, and coordination of care  [facility] Pager [phone]  If 7PM-7AM, please contact night-coverage [email or web address] Password  [facility] 08/18/2015, 6:34 AM

## 2015-08-18 NOTE — Evaluation (Signed)
Physical Therapy Evaluation Patient Details Name: Bruce Cole MRN: [name] DOB: 07/07/1941 Today's Date: 08/18/2015   History of Present Illness  Patient is a 75 y/o male with hx of A-fib, gout, CHF, CKD presents with increasing SOB, leg swelling, and DOE. BNP at 204.1. Admitted with Acute on chronic combined systolic and diastolic CHF exacerbation.  Clinical Impression  Patient presents with dyspnea on exertion, decreased strength, endurance and overall mobility. Tolerated short distance ambulation with min guard assist for safety. Education re: energy conservation techniques and importance of performing short bouts of exercise. Encouraged use of RW for support and energy conservation. Recommend ambulation 3x/day to improve endurance and mobility. Will follow acutely to maximize independence and mobility prior to return home.    Follow Up Recommendations Home health PT;Supervision - Intermittent    Equipment Recommendations  None recommended by PT    Recommendations for Other Services       Precautions / Restrictions Precautions Precautions: Fall Restrictions Weight Bearing Restrictions: No      Mobility  Bed Mobility Overal bed mobility: Needs Assistance Bed Mobility: Supine to Sit     Supine to sit: Modified independent (Device/Increase time);HOB elevated     General bed mobility comments: Increased time but no assist needed. Use of rail.  Transfers Overall transfer level: Needs assistance Equipment used: Rolling walker (2 wheeled) Transfers: Sit to/from Stand Sit to Stand: Min guard         General transfer comment: Min guard for safety. LEs braced on bed for support upon standing.  Ambulation/Gait Ambulation/Gait assistance: Min guard Ambulation Distance (Feet): 75 Feet Assistive device: Rolling walker (2 wheeled) Gait Pattern/deviations: Step-through pattern;Decreased stride length;Trunk flexed Gait velocity: decreased Gait velocity interpretation:  <1.8 ft/sec, indicative of risk for recurrent falls General Gait Details: Slow, mostly steady gait with RW.  2/4 DOE. Audible wheezing noted. HR 52-65 bpm with some PVCs. Did not get Sp02 reading. Ambulated on RA.  Stairs            Wheelchair Mobility    Modified Rankin (Stroke Patients Only)       Balance Overall balance assessment: Needs assistance Sitting-balance support: Feet supported;No upper extremity supported Sitting balance-Leahy Scale: Fair Sitting balance - Comments: Able to donn sock with increased effort, time and difficulty reaching outside BoS.   Standing balance support: During functional activity Standing balance-Leahy Scale: Poor Standing balance comment: Reliant on RW for support.                              Pertinent Vitals/Pain Pain Assessment: No/denies pain    Home Living Family/patient expects to be discharged to:: Private residence Living Arrangements: Alone   Type of Home: House Home Access: Stairs to enter   [employer] of Steps: 1 Home Layout: One level Home Equipment: Environmental consultant - 2 wheels;Cane - single point;Bedside commode      Prior Function Level of Independence: Independent with assistive device(s)         Comments: Pt uses SPC for household ambulation and RW for community ambulation. Drives. Cooks.     Hand Dominance        Extremity/Trunk Assessment   Upper Extremity Assessment: Defer to OT evaluation           Lower Extremity Assessment: Generalized weakness         Communication   Communication: No difficulties  Cognition Arousal/Alertness: Awake/alert Behavior During Therapy: WFL for tasks assessed/performed Overall Cognitive  Status: Within Functional Limits for tasks assessed                      General Comments      Exercises        Assessment/Plan    PT Assessment Patient needs continued PT services  PT Diagnosis Difficulty walking   PT Problem List  Decreased strength;Cardiopulmonary status limiting activity;Decreased balance;Decreased mobility;Decreased activity tolerance  PT Treatment Interventions Balance training;Gait training;Functional mobility training;Therapeutic activities;Therapeutic exercise;Patient/family education;Stair training   PT Goals (Current goals can be found in the Care Plan section) Acute Rehab PT Goals Patient Stated Goal: to return home PT Goal Formulation: With patient Time For Goal Achievement: 09/01/15 Potential to Achieve Goals: Good    Frequency Min 3X/week   Barriers to discharge Decreased caregiver support lives alone    Co-evaluation               End of Session Equipment Utilized During Treatment: Gait belt Activity Tolerance: Patient tolerated treatment well Patient left: in bed;with call bell/phone within reach (sitting EOB.) Nurse Communication: Mobility status         Time: [id number] PT Time Calculation (min) (ACUTE ONLY): 20 min   Charges:   PT Evaluation $PT Eval Moderate Complexity: 1 Procedure     PT G Codes:        Crystin Lechtenberg A Daelon Dunivan 08/18/2015, 4:23 PM  Wray Kearns, [place], DPT [phone]

## 2015-08-18 NOTE — Progress Notes (Addendum)
Patient seen and examined  75 year old male with a past medical history significant for HTN, A. fib on Coumadin, CHF, and CKD; who presented with increasing shortness of breath, leg swelling, and dyspnea on exertion over the last week. Patient was seen by his primary care provider today and referred to the emergency department. Patient notes a 7- 8 pound weight gain despite being on Lasix 80 mg twice a day  Patient had a right and left heart cath 02/2013, that showed normal coronaries, EF 45-50% with grade 2 diastolic dysfunction, moderate to severe right ventricular dilatation with mild systolic dysfunction and PA pressure of 60 mmHg  Creatinine function have progressively deteriorated and was noted to be 2.59 on 08/04/2015, and today elevated at 3.64. Patient had been given 30 mg of IV Lasix on the 29th when he went to the emergency department, and then again on the 30th symptoms were not improved prior to being transferred to [facility] for admission.  Plan Heart failure service consultation as the patient may need inotropic support, in the setting of increasing creatinine and aggressive diuresis Follow renal function closely If continues to get worse will consult nephrology, patient is followed by Dr. [name] Repeat 2-D echo Moderate-sized right pleural effusion with near complete collapse of the right lower lobe, will arrange for thoracentesis in am

## 2015-08-19 ENCOUNTER — Inpatient Hospital Stay (HOSPITAL_COMMUNITY): Payer: Medicare Other

## 2015-08-19 DIAGNOSIS — J90 Pleural effusion, not elsewhere classified: Secondary | ICD-10-CM | POA: Diagnosis present

## 2015-08-19 DIAGNOSIS — J948 Other specified pleural conditions: Secondary | ICD-10-CM

## 2015-08-19 LAB — CBC
HEMATOCRIT: 28.3 % — AB (ref 39.0–52.0)
Hemoglobin: 9.1 g/dL — ABNORMAL LOW (ref 13.0–17.0)
MCH: 28.8 pg (ref 26.0–34.0)
MCHC: 32.2 g/dL (ref 30.0–36.0)
MCV: 89.6 fL (ref 78.0–100.0)
PLATELETS: 155 10*3/uL (ref 150–400)
RBC: 3.16 MIL/uL — ABNORMAL LOW (ref 4.22–5.81)
RDW: 17.3 % — AB (ref 11.5–15.5)
WBC: 5.6 10*3/uL (ref 4.0–10.5)

## 2015-08-19 LAB — COMPREHENSIVE METABOLIC PANEL
ALBUMIN: 4 g/dL (ref 3.5–5.0)
ALT: 17 U/L (ref 17–63)
AST: 34 U/L (ref 15–41)
Alkaline Phosphatase: 103 U/L (ref 38–126)
Anion gap: 13 (ref 5–15)
BILIRUBIN TOTAL: 0.8 mg/dL (ref 0.3–1.2)
BUN: 91 mg/dL — ABNORMAL HIGH (ref 6–20)
CHLORIDE: 99 mmol/L — AB (ref 101–111)
CO2: 28 mmol/L (ref 22–32)
Calcium: 9.8 mg/dL (ref 8.9–10.3)
Creatinine, Ser: 3.39 mg/dL — ABNORMAL HIGH (ref 0.61–1.24)
GFR calc Af Amer: 19 mL/min — ABNORMAL LOW (ref >60)
GFR, EST NON AFRICAN AMERICAN: 17 mL/min — AB (ref >60)
Glucose, Bld: 111 mg/dL — ABNORMAL HIGH (ref 65–99)
POTASSIUM: 3.7 mmol/L (ref 3.5–5.1)
Sodium: 140 mmol/L (ref 135–145)
TOTAL PROTEIN: 7.3 g/dL (ref 6.5–8.1)

## 2015-08-19 LAB — PROTEIN, BODY FLUID: TOTAL PROTEIN, FLUID: 4.2 g/dL

## 2015-08-19 LAB — PROTIME-INR
INR: 3.1 — AB (ref 0.00–1.49)
Prothrombin Time: 31.4 seconds — ABNORMAL HIGH (ref 11.6–15.2)

## 2015-08-19 LAB — IRON AND TIBC
Iron: 22 ug/dL — ABNORMAL LOW (ref 45–182)
SATURATION RATIOS: 6 % — AB (ref 17.9–39.5)
TIBC: 361 ug/dL (ref 250–450)
UIBC: 339 ug/dL

## 2015-08-19 LAB — BODY FLUID CELL COUNT WITH DIFFERENTIAL
Eos, Fluid: 0 %
Lymphs, Fluid: 21 %
Monocyte-Macrophage-Serous Fluid: 76 % (ref 50–90)
Neutrophil Count, Fluid: 3 % (ref 0–25)
Total Nucleated Cell Count, Fluid: 305 cu mm (ref 0–1000)

## 2015-08-19 LAB — GRAM STAIN

## 2015-08-19 LAB — FERRITIN: FERRITIN: 19 ng/mL — AB (ref 24–336)

## 2015-08-19 LAB — CREATININE, URINE, RANDOM: CREATININE, URINE: 95.76 mg/dL

## 2015-08-19 MED ORDER — POTASSIUM CHLORIDE CRYS ER 20 MEQ PO TBCR
20.0000 meq | EXTENDED_RELEASE_TABLET | Freq: Once | ORAL | Status: AC
  Administered 2015-08-19: 20 meq via ORAL
  Filled 2015-08-19: qty 1

## 2015-08-19 MED ORDER — ZOLPIDEM TARTRATE 5 MG PO TABS
5.0000 mg | ORAL_TABLET | Freq: Once | ORAL | Status: AC
  Administered 2015-08-19: 5 mg via ORAL
  Filled 2015-08-19: qty 1

## 2015-08-19 MED ORDER — FUROSEMIDE 10 MG/ML IJ SOLN
80.0000 mg | Freq: Three times a day (TID) | INTRAMUSCULAR | Status: DC
  Administered 2015-08-19 – 2015-08-21 (×7): 80 mg via INTRAVENOUS
  Filled 2015-08-19 (×7): qty 8

## 2015-08-19 MED ORDER — HYDROCOD POLST-CPM POLST ER 10-8 MG/5ML PO SUER
5.0000 mL | Freq: Once | ORAL | Status: AC
  Administered 2015-08-19: 5 mL via ORAL
  Filled 2015-08-19: qty 5

## 2015-08-19 MED ORDER — LIDOCAINE HCL (PF) 1 % IJ SOLN
INTRAMUSCULAR | Status: AC
  Filled 2015-08-19: qty 10

## 2015-08-19 MED ORDER — WARFARIN SODIUM 5 MG PO TABS
5.0000 mg | ORAL_TABLET | Freq: Once | ORAL | Status: AC
  Administered 2015-08-19: 5 mg via ORAL
  Filled 2015-08-19: qty 1

## 2015-08-19 MED ORDER — ALBUTEROL SULFATE (2.5 MG/3ML) 0.083% IN NEBU
2.5000 mg | INHALATION_SOLUTION | Freq: Two times a day (BID) | RESPIRATORY_TRACT | Status: DC
  Administered 2015-08-19: 2.5 mg via RESPIRATORY_TRACT
  Filled 2015-08-19: qty 3

## 2015-08-19 MED ORDER — WARFARIN SODIUM 7.5 MG PO TABS: 7.5000 mg | ORAL_TABLET | Freq: Every day | ORAL | Status: DC

## 2015-08-19 NOTE — Progress Notes (Signed)
[facility] PROGRESS NOTE   ELIM PIEL [name] DOB: 12-24-1940 DOA: 08/17/2015 PCP: Nance Pear., NP  HPI/Subjective: Feels better, denies shortness of breath.  Assessment/Plan: Principal Problem:   Acute on chronic combined systolic (congestive) and diastolic (congestive) heart failure (HCC) Active Problems:   Essential hypertension   ATRIAL FIBRILLATION   Diabetes mellitus type 2, controlled, with complications ([place])   Long term (current) use of anticoagulants   CKD (chronic kidney disease), stage IV (HCC)   Acute renal failure superimposed on stage 3 chronic kidney disease (HCC)   Acute on chronic combined systolic and diastolic CHF exacerbation ([place]):  -Patient with increased weight gain not relieved by po Lasix. Patient with +2 pitting edema lower extremity.  -BNP elevated at 204.1 on admission. Last EF 45-50% showing combined systolic and diastolic dysfunction back in 03/2013. - Consult cardiology patient is seen by Dr. Kennieth Francois - Echocardiogram in a.m. - Follow intake/output, daily weight, elevate legs and salt and fluid restriction. - Continue metolazone, isosorbide mononitrate, hydralazine  Acute renal failure on chronic kidney disease stage IV -Patient's kidney function has progressively worsened over the last 2 weeks from 2.59 on 1/17 to 3.64 on admission. -Renal ultrasound showed medical renal disease. -Consult nephrology for further recommendation.  Diabetes mellitus type 2 -Controlled diabetes mellitus type 2, hemoglobin A1c is 6.7 on 08/04/15. -Carbohydrate modified diet and insulin sliding scale while was in the hospital.  Atrial fibrillation on Coumadin -INR therapeutic at 2.65 in patient appears to be rate controlled at this time. Chadsvasc score 4. -Consult pharmacy to dose Coumadin  Hypertension -Continue amlodipine, Coreg, and other blood pressure medications as listed above  Question of acute urinary retention -Check  bladder scan and place Foley if greater than 300 mL of fluid present  Code Status: Full Code Family Communication: Plan discussed with the patient. Disposition Plan: Remains inpatient Diet: Diet Heart Room service appropriate?: Yes; Fluid consistency:: Thin  Consultants:  Heart failure team  Procedures:  None  Antibiotics:  None   Objective: Filed Vitals:   08/19/15 0839 08/19/15 0944  BP:  126/69  Pulse:  55  Temp:    Resp: 16     Intake/Output Summary (Last 24 hours) at 08/19/15 1102 Last data filed at 08/19/15 0947  Gross per 24 hour  Intake   1052 ml  Output   2100 ml  Net  -1048 ml   Filed Weights   08/17/15 1942 08/18/15 0455 08/19/15 0456  Weight: 130.364 kg (287 lb 6.4 oz) 129.683 kg (285 lb 14.4 oz) 130.273 kg (287 lb 3.2 oz)    Exam: General: Alert and awake, oriented x3, not in any acute distress. HEENT: anicteric sclera, pupils reactive to light and accommodation, EOMI CVS: S1-S2 clear, no murmur rubs or gallops Chest: clear to auscultation bilaterally, no wheezing, rales or rhonchi Abdomen: soft nontender, nondistended, normal bowel sounds, no organomegaly Extremities: no cyanosis, has +2 edema noted bilaterally Neuro: Cranial nerves II-XII intact, no focal neurological deficits  Data Reviewed: Basic Metabolic Panel:  Recent Labs Lab 08/12/15 1930 08/16/15 2350 08/17/15 1535 08/18/15 0413 08/19/15 0348  NA 138 139 139 143 140  K 4.3 4.0 4.0 3.8 3.7  CL 102 101 102 103 99*  CO2 26 24 24 27 28   GLUCOSE 127* 126* 126* 92 111*  BUN 93* 89* 91* 91* 91*  CREATININE 3.35* 3.51* 3.64* 3.70* 3.39*  CALCIUM 9.5 10.0 9.6 10.0 9.8   Liver Function Tests:  Recent Labs Lab 08/19/15 0348  AST 34  ALT 17  ALKPHOS 103  BILITOT 0.8  PROT 7.3  ALBUMIN 4.0   No results for input(s): LIPASE, AMYLASE in the last 168 hours. No results for input(s): AMMONIA in the last 168 hours. CBC:  Recent Labs Lab 08/12/15 1930 08/16/15 2350  08/17/15 1535 08/18/15 0413 08/19/15 0348  WBC 6.3 5.8 6.1 5.5 5.6  NEUTROABS 4.4 4.0 4.8 3.9  --   HGB 9.5* 9.6* 9.2* 9.2* 9.1*  HCT 29.5* 29.8* 28.7* 28.9* 28.3*  MCV 89.7 89.5 89.1 88.7 89.6  PLT 165 168 159 162 155   Cardiac Enzymes:  Recent Labs Lab 08/16/15 2350 08/17/15 1535 08/17/15 2248 08/18/15 0413 08/18/15 1112  TROPONINI 0.06* 0.08* 0.07* 0.08* 0.08*   BNP (last 3 results)  Recent Labs  08/12/15 1930 08/16/15 2350 08/17/15 1535  BNP 178.6* 174.2* 204.1*    ProBNP (last 3 results) No results for input(s): PROBNP in the last 8760 hours.  CBG: No results for input(s): GLUCAP in the last 168 hours.  Micro No results found for this or any previous visit (from the past 240 hour(s)).   Studies: Ct Chest Wo Contrast  08/18/2015  CLINICAL DATA:  Follow-up right pleural effusion. History of atrial fibrillation. EXAM: CT CHEST WITHOUT CONTRAST TECHNIQUE: Multidetector CT imaging of the chest was performed following the standard protocol without IV contrast. COMPARISON:  Chest radiograph 08/17/2015 and abdominal CT 05/01/2015 FINDINGS: Mediastinum/Lymph Nodes: No gross abnormality in the visualized thyroid tissue. No significant mediastinal or hilar lymphadenopathy but limited evaluation without intravenous contrast. Enlargement of the mid ascending thoracic aorta measuring up to 4.1 cm. Aneurysm of the proximal descending thoracic aorta measuring up to 4.8 cm. The mid descending thoracic aorta measures 4.0. The distal descending thoracic aorta measures 3.4 cm. Heart size is enlarged. No significant pericardial fluid. Lungs/Pleura: There is a moderate sized right pleural effusion with extensive volume loss throughout the right lower lobe. Minimal aeration in the right lower lobe. Scattered ground-glass densities in the left lower lobe probably represent areas of atelectasis. No clear evidence for pulmonary edema. Upper abdomen: Limited evaluation of the upper abdomen on  this non contrast examination. Fullness in left adrenal gland probably related to an adenoma. Probable exophytic cyst involving the left kidney pole Musculoskeletal: Laminectomy changes in lower thoracic spine. Multilevel degenerative disc disease in the thoracic spine. IMPRESSION: Moderate-sized right pleural effusion with near complete collapse of the right lower lobe. Limited evaluation of the right lower lobe due to volume loss. Cardiomegaly. Aneurysm of the descending thoracic aorta, measuring up to 4.8 cm. Aneurysm of the ascending thoracic aorta measuring up to 4.1 cm. Recommend semi-annual imaging followup by CTA or MRA and referral to cardiothoracic surgery if not already obtained. This recommendation follows 2010 ACCF/AHA/AATS/ACR/ASA/SCA/SCAI/SIR/STS/SVM Guidelines for the Diagnosis and Management of Patients With Thoracic Aortic Disease. Circulation. 2010; [id number] Electronically Signed   By: Markus Daft M.D.   On: 08/18/2015 18:39   US Renal  08/18/2015  CLINICAL DATA:  Acute kidney injury, known chronic renal insufficiency. EXAM: RENAL / URINARY TRACT ULTRASOUND COMPLETE COMPARISON:  Noncontrast CT scan of the abdomen and pelvis dated May 01, 2015 and abdominal ultrasound of April 08, 2013 FINDINGS: Right Kidney: Length: 14.2 cm. The renal cortical echotexture is approximately equal to that of the liver. There is no hydronephrosis. There are multiple simple appearing cortical cysts. The largest cyst measures 2.5 x 1.9 x 2.2 cm. A midpole cyst measures 2.1 x 1.8 x 1.7 cm. Left Kidney: Length: 12.8 cm. The  renal cortical echotexture is similar to that on the left. There are multiple cortical cysts. One in the upper pole measures 2.8 x 2.8 x 2.6 cm. A second cyst in the midpole measures 3.1 x 2.5 x 3 cm. There is no hydronephrosis. Bladder: The urinary bladder is decompressed due to postvoid state. There is a moderate-sized right pleural effusion. IMPRESSION: 1. Increased renal cortical  echotexture which is been previously demonstrated consistent with medical renal disease. No significant renal cortical atrophy is observed. There are multiple simple appearing cortical cysts bilaterally. There is no hydronephrosis. 2. There is a moderate-sized right pleural effusion. Electronically Signed   By: David  Martinique M.D.   On: 08/18/2015 11:27   Dg Chest 2v Repeat Same Day  08/17/2015  CLINICAL DATA:  Increased shortness of breath and leg swelling. EXAM: CHEST  2 VIEW COMPARISON:  08/17/2015 and 08/12/2015 and 04/15/2015 FINDINGS: Again noted are right basilar chest densities and suspect elevation of the right hemidiaphragm. There may be compressive atelectasis or airspace disease along the medial right lung base. Left lung remains clear. Heart and mediastinum are stable. Stable mild cardiomegaly. IMPRESSION: Persistent densities at the right lung base, particularly along the medial right lung base. Findings could represent a combination of consolidation and pleural fluid. This represents a significant change from the examination in 2016. Recommend follow-up to ensure improved aeration at the right lung base. If aeration does not improve, recommend further characterization with CT. Electronically Signed   By: Markus Daft M.D.   On: 08/17/2015 16:13    Scheduled Meds: . albuterol  2.5 mg Nebulization BID  . allopurinol  200 mg Oral Daily  . amLODipine  10 mg Oral Daily  . carvedilol  6.25 mg Oral BID  . furosemide  80 mg Intravenous TID  . guaiFENesin  600 mg Oral BID  . hydrALAZINE  50 mg Oral BID  . hydroxychloroquine  200 mg Oral BID  . isosorbide mononitrate  30 mg Oral Daily  . metolazone  5 mg Oral BID  . multivitamin with minerals  1 tablet Oral Daily  . potassium chloride  20 mEq Oral Once  . sodium chloride flush  3 mL Intravenous Q12H  . terazosin  1 mg Oral QHS  . warfarin  5 mg Oral ONCE-1800  . [START ON 08/20/2015] warfarin  7.5 mg Oral q1800  . Warfarin - Pharmacist Dosing  Inpatient   Does not apply q1800   Continuous Infusions:      Time spent: 35 minutes    [facility] A  [facility] Pager [phone] If 7PM-7AM, please contact night-coverage at [email or web address], password [facility] 08/19/2015, 11:02 AM  LOS: 2 days

## 2015-08-19 NOTE — Progress Notes (Signed)
Advanced Heart Failure Rounding Note Referring Physician: Dr Allyson Sabal Primary Physician: Debbrah Alar, NP Primary Cardiologist: Dr. Haroldine Laws   Reason for Consultation: A/C diastolic HF  Subjective:     Out 1.8 L x 24 hrs.  Weight shows up ~ 2 lbs. SOB improving.  Still coughing a lot.  Denies CP, lightheadedness, or dizziness. Still swollen into thighs. Slightly apprehensive about upcoming thoracentesis.   Echo 08/18/15 - LVEF 40-45%, diffuse hypokinesis, severe TR, severe RAE, RV severely dilated, mild/moderately reduced  Chest CT 08/18/15 -  1. Moderate-sized right pleural effusion with near complete collapse of the RLL. Limited evaluation of the RLL due to volume loss.  2. Cardiomegaly 3. Aneurysm of the descending thoracic aorta, measuring up to 4.8 cm. Aneurysm of the ascending thoracic aorta measuring up to 4.1 cm. (recommend semi-annual follow up.)  Objective:   Weight Range: 287 lb 3.2 oz (130.273 kg) Body mass index is 38.94 kg/(m^2).   Vital Signs:   Temp:  [97.9 F (36.6 C)-98.6 F (37 C)] 98.6 F (37 C) (02/01 0456) Pulse Rate:  [51-53] 51 (02/01 0456) Resp:  [20-22] 20 (02/01 0456) BP: (114-127)/(56-62) 114/62 mmHg (02/01 0456) SpO2:  [96 %-99 %] 99 % (02/01 0456) Weight:  [287 lb 3.2 oz (130.273 kg)] 287 lb 3.2 oz (130.273 kg) (02/01 0456) Last BM Date: 08/16/15  Weight change: Filed Weights   08/17/15 1942 08/18/15 0455 08/19/15 0456  Weight: 287 lb 6.4 oz (130.364 kg) 285 lb 14.4 oz (129.683 kg) 287 lb 3.2 oz (130.273 kg)    Intake/Output:   Intake/Output Summary (Last 24 hours) at 08/19/15 0758 Last data filed at 08/19/15 0716  Gross per 24 hour  Intake    877 ml  Output   2100 ml  Net  -1223 ml     Physical Exam: General: Well appearing. No resp difficulty. Lying in bed HEENT: normal Neck: supple. JVP to jaw. Carotids 2+ bilaterally; no bruits. No thyromegaly or lymphadenopathy noted. Cor: PMI normal. Bradycardic, irregularly  irregular. No rubs, gallops. 2/6 early SEM RUSB Lungs: Markedly diminished bases, Right lower field absent.  Abdomen: soft, NT, ND, no HSM. No bruits or masses. +BS  Extremities: no cyanosis, clubbing, rash. 2+ peripheral edema into thighs.  Neuro: alert & orientedx3, cranial nerves grossly intact. Moves all 4 extremities w/o difficulty. Affect pleasant.  Telemetry: Reviewed personally, afib 50s  Labs: CBC  Recent Labs  08/17/15 1535 08/18/15 0413 08/19/15 0348  WBC 6.1 5.5 5.6  NEUTROABS 4.8 3.9  --   HGB 9.2* 9.2* 9.1*  HCT 28.7* 28.9* 28.3*  MCV 89.1 88.7 89.6  PLT 159 162 [id number]   Basic Metabolic Panel  Recent Labs  08/18/15 0413 08/19/15 0348  NA 143 140  K 3.8 3.7  CL 103 99*  CO2 27 28  GLUCOSE 92 111*  BUN 91* 91*  CREATININE 3.70* 3.39*  CALCIUM 10.0 9.8   Liver Function Tests  Recent Labs  08/19/15 0348  AST 34  ALT 17  ALKPHOS 103  BILITOT 0.8  PROT 7.3  ALBUMIN 4.0   No results for input(s): LIPASE, AMYLASE in the last 72 hours. Cardiac Enzymes  Recent Labs  08/17/15 2248 08/18/15 0413 08/18/15 1112  TROPONINI 0.07* 0.08* 0.08*    BNP: BNP (last 3 results)  Recent Labs  08/12/15 1930 08/16/15 2350 08/17/15 1535  BNP 178.6* 174.2* 204.1*    ProBNP (last 3 results) No results for input(s): PROBNP in the last 8760 hours.   D-Dimer No  results for input(s): DDIMER in the last 72 hours. Hemoglobin A1C No results for input(s): HGBA1C in the last 72 hours. Fasting Lipid Panel No results for input(s): CHOL, HDL, LDLCALC, TRIG, CHOLHDL, LDLDIRECT in the last 72 hours. Thyroid Function Tests No results for input(s): TSH, T4TOTAL, T3FREE, THYROIDAB in the last 72 hours.  Invalid input(s): FREET3  Other results:     Imaging/Studies:  Ct Chest Wo Contrast  08/18/2015  CLINICAL DATA:  Follow-up right pleural effusion. History of atrial fibrillation. EXAM: CT CHEST WITHOUT CONTRAST TECHNIQUE: Multidetector CT imaging of the  chest was performed following the standard protocol without IV contrast. COMPARISON:  Chest radiograph 08/17/2015 and abdominal CT 05/01/2015 FINDINGS: Mediastinum/Lymph Nodes: No gross abnormality in the visualized thyroid tissue. No significant mediastinal or hilar lymphadenopathy but limited evaluation without intravenous contrast. Enlargement of the mid ascending thoracic aorta measuring up to 4.1 cm. Aneurysm of the proximal descending thoracic aorta measuring up to 4.8 cm. The mid descending thoracic aorta measures 4.0. The distal descending thoracic aorta measures 3.4 cm. Heart size is enlarged. No significant pericardial fluid. Lungs/Pleura: There is a moderate sized right pleural effusion with extensive volume loss throughout the right lower lobe. Minimal aeration in the right lower lobe. Scattered ground-glass densities in the left lower lobe probably represent areas of atelectasis. No clear evidence for pulmonary edema. Upper abdomen: Limited evaluation of the upper abdomen on this non contrast examination. Fullness in left adrenal gland probably related to an adenoma. Probable exophytic cyst involving the left kidney pole Musculoskeletal: Laminectomy changes in lower thoracic spine. Multilevel degenerative disc disease in the thoracic spine. IMPRESSION: Moderate-sized right pleural effusion with near complete collapse of the right lower lobe. Limited evaluation of the right lower lobe due to volume loss. Cardiomegaly. Aneurysm of the descending thoracic aorta, measuring up to 4.8 cm. Aneurysm of the ascending thoracic aorta measuring up to 4.1 cm. Recommend semi-annual imaging followup by CTA or MRA and referral to cardiothoracic surgery if not already obtained. This recommendation follows 2010 ACCF/AHA/AATS/ACR/ASA/SCA/SCAI/SIR/STS/SVM Guidelines for the Diagnosis and Management of Patients With Thoracic Aortic Disease. Circulation. 2010; [id number] Electronically Signed   By: Markus Daft M.D.   On:  08/18/2015 18:39   US Renal  08/18/2015  CLINICAL DATA:  Acute kidney injury, known chronic renal insufficiency. EXAM: RENAL / URINARY TRACT ULTRASOUND COMPLETE COMPARISON:  Noncontrast CT scan of the abdomen and pelvis dated May 01, 2015 and abdominal ultrasound of April 08, 2013 FINDINGS: Right Kidney: Length: 14.2 cm. The renal cortical echotexture is approximately equal to that of the liver. There is no hydronephrosis. There are multiple simple appearing cortical cysts. The largest cyst measures 2.5 x 1.9 x 2.2 cm. A midpole cyst measures 2.1 x 1.8 x 1.7 cm. Left Kidney: Length: 12.8 cm. The renal cortical echotexture is similar to that on the left. There are multiple cortical cysts. One in the upper pole measures 2.8 x 2.8 x 2.6 cm. A second cyst in the midpole measures 3.1 x 2.5 x 3 cm. There is no hydronephrosis. Bladder: The urinary bladder is decompressed due to postvoid state. There is a moderate-sized right pleural effusion. IMPRESSION: 1. Increased renal cortical echotexture which is been previously demonstrated consistent with medical renal disease. No significant renal cortical atrophy is observed. There are multiple simple appearing cortical cysts bilaterally. There is no hydronephrosis. 2. There is a moderate-sized right pleural effusion. Electronically Signed   By: David  Martinique M.D.   On: 08/18/2015 11:27   Dg Chest  2v Repeat Same Day  08/17/2015  CLINICAL DATA:  Increased shortness of breath and leg swelling. EXAM: CHEST  2 VIEW COMPARISON:  08/17/2015 and 08/12/2015 and 04/15/2015 FINDINGS: Again noted are right basilar chest densities and suspect elevation of the right hemidiaphragm. There may be compressive atelectasis or airspace disease along the medial right lung base. Left lung remains clear. Heart and mediastinum are stable. Stable mild cardiomegaly. IMPRESSION: Persistent densities at the right lung base, particularly along the medial right lung base. Findings could  represent a combination of consolidation and pleural fluid. This represents a significant change from the examination in 2016. Recommend follow-up to ensure improved aeration at the right lung base. If aeration does not improve, recommend further characterization with CT. Electronically Signed   By: Markus Daft M.D.   On: 08/17/2015 16:13     Latest Echo  Latest Cath   Medications:     Scheduled Medications: . albuterol  2.5 mg Nebulization TID  . allopurinol  200 mg Oral Daily  . amLODipine  10 mg Oral Daily  . carvedilol  6.25 mg Oral BID  . furosemide  80 mg Intravenous BID  . guaiFENesin  600 mg Oral BID  . hydrALAZINE  50 mg Oral BID  . hydroxychloroquine  200 mg Oral BID  . isosorbide mononitrate  30 mg Oral Daily  . metolazone  5 mg Oral BID  . multivitamin with minerals  1 tablet Oral Daily  . sodium chloride flush  3 mL Intravenous Q12H  . terazosin  1 mg Oral QHS  . warfarin  7.5 mg Oral q1800  . Warfarin - Pharmacist Dosing Inpatient   Does not apply q1800     Infusions:     PRN Medications:  sodium chloride, acetaminophen, albuterol, ondansetron (ZOFRAN) IV, oxymetazoline, sodium chloride flush   Assessment   1. Chronic combined HF - Echo 03/2013 45-50% with grade 2 DD, PA peak 60 mm Hg 2. AKI on CKD III 3. Chronic afib on coumadin 4. Pleural effusion on right  Plan    Echo 08/18/15 - LVEF 40-45%, diffuse hypokinesis, severe TR, severe RAE, RV severely dilated, mild/moderately reduced, PA peak pressure 44 mm Hg.   Weight up despite negative I/O on lasix 80 mg IV BID and metolazone 5 mg BID. Creatinine improved 3.7 => 3.39. K 3.7 Can see how he responds to dose this morning, and increase lasix to TID if poor/minimal response. Gently Supp K.   Afib remains stable in rate of 50s. No bleeding on coumadin.   CT Chest with moderate effusion collapsing RLL.  Thoracentesis ordered.  Length of Stay: 2   Shirley Friar PA-C 08/19/2015, 7:58  AM  Advanced Heart Failure Team Pager [phone] (M-F; 7a - 4p)  Please contact [facility] for night-coverage after hours (4p -7a ) and weekends on [email or web address]   Patient seen and examined with Oda Kilts, PA-C. We discussed all aspects of the encounter. I agree with the assessment and plan as stated above.   Volume status improving slowly. Will continue to diurese. BUN remains high. May be time to consider placing HD access. Renal service to see today. CT of chest reviewed personally. Has large right pleural effusion with volume loss. Will plan paracentesis today. AF is stable. Continue coumadin. Echo images reviewed. EF 40-45% stable. D/w Dr. [place] Poli.   Athea Haley,MD 3:36 PM

## 2015-08-19 NOTE — Consult Note (Signed)
[name] Consult Note     Date: 08/19/2015                  Patient Name:  Bruce Cole  MRN: [id number]  DOB: 1941/03/16  Age / Sex: 75 y.o., male         PCP: Bruce Pear., NP                 Service Requesting Consult: [facility]                 Reason for Consult: Hypervolemia, evaluate for possible HD            Chief Complaint:  "retaining fluid" HPI:   Bruce Cole is a 75 year old male with a past medical history significant for HTN, A. fib on Coumadin, CHF, and CKD stage III; who presented with increasing shortness of breath, leg swelling, and dyspnea on exertion, non-productive cough over the last week with a 7- 8 pound weight gain despite being on Lasix 80 mg twice a day. He notes that for 2-3 weeks sleeping on the right lateral made it difficult to breath.  On ROS he denies any chest pain, nausea, vomiting, fever, or chills. He noted difficulty urinating on admission however denies dysuria, urinary frequency, urinary urgency, or decreased urine outpatient however does note a decrease in stream strength. He is followed by the heart failure clinic as an outpatient.   In the ED, he was noted to have an elevated BNP at 204.1. Creatinine went from 2.59 on 08/04/2015 to 3.64 on the day of admission. Patient had been given 30 mg of IV Lasix on the 29th when he went to the emergency department, and then again on the 30th symptoms were not improved prior to being transferred to [facility] for admission.   Today the patient endorses stable LE edema and improved SOB since admission. He continues to have a hacking non-productive cough. Dry weight at home is 275lbs per report  Past Medical History  Diagnosis Date  . Nephrolithiasis   . Arthritis     rheumatoid  . Colon polyp 2000  . Atrial fibrillation ([place])   . Unspecified essential hypertension   . Gout   . CHF (congestive heart failure) [facility])     Past Surgical History  Procedure Laterality  Date  . Spine surgery      x 2  . Joint replacement      Total L-Hip replacement, Right Knee 10/20/09  . Cholecystectomy  1994  . Left and right heart catheterization with coronary angiogram N/A 02/22/2013    Procedure: LEFT AND RIGHT HEART CATHETERIZATION WITH CORONARY ANGIOGRAM;  Surgeon: Jolaine Artist, MD;  Location: [facility] CATH LAB;  Service: Cardiovascular;  Laterality: N/A;    Family History  Problem Relation Age of Onset  . Hypertension Mother   . Arthritis Mother     ?RA  . Hypertension Father    Social History:  reports that he has never smoked. He has never used smokeless tobacco. He reports that he drinks alcohol. He reports that he does not use illicit drugs.  Allergies:  Allergies  Allergen Reactions  . Ace Inhibitors Other (See Comments)    Worsening renal insufficiency    Medications Prior to Admission  Medication Sig Dispense Refill  . albuterol (PROVENTIL) (2.5 MG/3ML) 0.083% nebulizer solution Take 2.5 mg by nebulization every 4 (four) hours.    . carvedilol (COREG) 12.5 MG tablet TAKE 1 TABLET BY MOUTH EVERY  MORNING AND TAKE 1/2 TABLET BY MOUTH IN THE EVENING (Patient taking differently: TAKE 1/2 TABLET BY MOUTH TWICE DAILY) 45 tablet 3  . colchicine 0.6 MG tablet TAKE 2 TABLETS BY MOUTH AT THE START OF GOUT FLARE AND TAKE 1 TABLET 3 HOURS LATER; MAX 3 TABS PER GOUT ATTACK 30 tablet 0  . fluticasone (FLONASE) 50 MCG/ACT nasal spray Place 2 sprays into both nostrils daily. (Patient taking differently: Place 2 sprays into both nostrils daily as needed (seasonal allergies). ) 16 g 6  . furosemide (LASIX) 40 MG tablet Take 4 tablets (160 mg total) by mouth 2 (two) times daily. (Patient taking differently: Take 80 mg by mouth 2 (two) times daily. ) 240 tablet 5  . guaiFENesin (MUCINEX) 600 MG 12 hr tablet Take 600 mg by mouth 2 (two) times daily.    . hydrALAZINE (APRESOLINE) 50 MG tablet TAKE 1 TABLET BY MOUTH THREE TIMES DAILY (Patient taking differently: TAKE 1 TABLET  BY MOUTH TWO TIMES DAILY) 270 tablet 1  . hydroxychloroquine (PLAQUENIL) 200 MG tablet Take 200 mg by mouth 2 (two) times daily.    . isosorbide mononitrate (IMDUR) 30 MG 24 hr tablet TAKE 1 TABLET BY MOUTH EVERY DAY 90 tablet 0  . metolazone (ZAROXOLYN) 2.5 MG tablet Take 1 tablet (2.5 mg total) by mouth 2 (two) times a week. (Patient taking differently: Take 2.5 mg by mouth 2 (two) times a week. Tuesday and Thursday) 30 tablet 0  . Multiple Vitamin (MULTIVITAMIN WITH MINERALS) TABS tablet Take 1 tablet by mouth daily.    . ondansetron (ZOFRAN ODT) 4 MG disintegrating tablet Take 1 tablet (4 mg total) by mouth every 8 (eight) hours as needed for nausea or vomiting. 12 tablet 1  . Oxymetazoline HCl (MUCINEX NASAL SPRAY MOISTURE NA) Place 1 spray into both nostrils 4 (four) times daily as needed (congestion).    . potassium chloride SA (K-DUR,KLOR-CON) 20 MEQ tablet TAKE 1 TABLET BY MOUTH THREE TIMES DAILY (Patient taking differently: TAKE 1 TABLET BY MOUTH TWO TIMES DAILY) 270 tablet 3  . PRESCRIPTION MEDICATION Inhale 1 vial into the lungs every 4 (four) hours as needed (shortness of breath or wheezing).    [name] spironolactone (ALDACTONE) 25 MG tablet TAKE 1 TABLET BY MOUTH DAILY 30 tablet 3  . terazosin (HYTRIN) 1 MG capsule TAKE 1 CAPSULE BY MOUTH EVERY NIGHT AT BEDTIME 30 capsule 5  . warfarin (COUMADIN) 7.5 MG tablet TAKE 1 TABLET BY MOUTH EVERY DAILY AS DIRECTED PER COUMADIN CLINIC (Patient taking differently: TAKE 1 TABLET BY MOUTH EVERY DAILY AT BEDTIME OR AS DIRECTED PER COUMADIN CLINIC) 40 tablet 3  . glucose blood test strip Use as to check blood sugar 1 to 2 times a day.  DX  E11.8    . HYDROcodone-acetaminophen (NORCO/VICODIN) 5-325 MG tablet Take 1-2 tablets by mouth every 6 (six) hours as needed for moderate pain. (Patient not taking: Reported on 08/17/2015) 20 tablet 0  . levofloxacin (LEVAQUIN) 250 MG tablet Take 1 tablet (250 mg total) by mouth daily. 7 tablet 0    Results for orders  placed or performed during the hospital encounter of 08/17/15 (from the past 48 hour(s))  CBC with Differential     Status: Abnormal   Collection Time: 08/17/15  3:35 PM  Result Value Ref Range   WBC 6.1 4.0 - 10.5 K/uL   RBC 3.22 (L) 4.22 - 5.81 MIL/uL   Hemoglobin 9.2 (L) 13.0 - 17.0 g/dL   HCT 28.7 (L) 39.0 -  52.0 %   MCV 89.1 78.0 - 100.0 fL   MCH 28.6 26.0 - 34.0 pg   MCHC 32.1 30.0 - 36.0 g/dL   RDW 17.7 (H) 11.5 - 15.5 %   Platelets 159 150 - 400 K/uL   Neutrophils Relative % 77 %   Neutro Abs 4.8 1.7 - 7.7 K/uL   Lymphocytes Relative 12 %   Lymphs Abs 0.7 0.7 - 4.0 K/uL   Monocytes Relative 9 %   Monocytes Absolute 0.5 0.1 - 1.0 K/uL   Eosinophils Relative 1 %   Eosinophils Absolute 0.1 0.0 - 0.7 K/uL   Basophils Relative 1 %   Basophils Absolute 0.0 0.0 - 0.1 K/uL  Basic metabolic panel     Status: Abnormal   Collection Time: 08/17/15  3:35 PM  Result Value Ref Range   Sodium 139 135 - 145 mmol/L   Potassium 4.0 3.5 - 5.1 mmol/L   Chloride 102 101 - 111 mmol/L   CO2 24 22 - 32 mmol/L   Glucose, Bld 126 (H) 65 - 99 mg/dL   BUN 91 (H) 6 - 20 mg/dL   Creatinine, Ser 3.64 (H) 0.61 - 1.24 mg/dL   Calcium 9.6 8.9 - 10.3 mg/dL   GFR calc non Af Amer 15 (L) >60 mL/min   GFR calc Af Amer 18 (L) >60 mL/min    Comment: (NOTE) The eGFR has been calculated using the CKD EPI equation. This calculation has not been validated in all clinical situations. eGFR's persistently <60 mL/min signify possible Chronic Kidney Disease.    Anion gap 13 5 - 15  Brain natriuretic peptide     Status: Abnormal   Collection Time: 08/17/15  3:35 PM  Result Value Ref Range   B Natriuretic Peptide 204.1 (H) 0.0 - 100.0 pg/mL  Troponin I     Status: Abnormal   Collection Time: 08/17/15  3:35 PM  Result Value Ref Range   Troponin I 0.08 (H) <0.031 ng/mL    Comment:        PERSISTENTLY INCREASED TROPONIN VALUES IN THE RANGE OF 0.04-0.49 ng/mL CAN BE SEEN IN:       -UNSTABLE ANGINA        -CONGESTIVE HEART FAILURE       -MYOCARDITIS       -CHEST TRAUMA       -ARRYHTHMIAS       -LATE PRESENTING MYOCARDIAL INFARCTION       -COPD   CLINICAL FOLLOW-UP RECOMMENDED.   Troponin I (q 6hr x 3)     Status: Abnormal   Collection Time: 08/17/15 10:48 PM  Result Value Ref Range   Troponin I 0.07 (H) <0.031 ng/mL    Comment:        PERSISTENTLY INCREASED TROPONIN VALUES IN THE RANGE OF 0.04-0.49 ng/mL CAN BE SEEN IN:       -UNSTABLE ANGINA       -CONGESTIVE HEART FAILURE       -MYOCARDITIS       -CHEST TRAUMA       -ARRYHTHMIAS       -LATE PRESENTING MYOCARDIAL INFARCTION       -COPD   CLINICAL FOLLOW-UP RECOMMENDED.   Basic metabolic panel     Status: Abnormal   Collection Time: 08/18/15  4:13 AM  Result Value Ref Range   Sodium 143 135 - 145 mmol/L   Potassium 3.8 3.5 - 5.1 mmol/L   Chloride 103 101 - 111 mmol/L   CO2 27 22 -  32 mmol/L   Glucose, Bld 92 65 - 99 mg/dL   BUN 91 (H) 6 - 20 mg/dL   Creatinine, Ser 3.70 (H) 0.61 - 1.24 mg/dL   Calcium 10.0 8.9 - 10.3 mg/dL   GFR calc non Af Amer 15 (L) >60 mL/min   GFR calc Af Amer 17 (L) >60 mL/min    Comment: (NOTE) The eGFR has been calculated using the CKD EPI equation. This calculation has not been validated in all clinical situations. eGFR's persistently <60 mL/min signify possible Chronic Kidney Disease.    Anion gap 13 5 - 15  CBC WITH DIFFERENTIAL     Status: Abnormal   Collection Time: 08/18/15  4:13 AM  Result Value Ref Range   WBC 5.5 4.0 - 10.5 K/uL   RBC 3.26 (L) 4.22 - 5.81 MIL/uL   Hemoglobin 9.2 (L) 13.0 - 17.0 g/dL   HCT 28.9 (L) 39.0 - 52.0 %   MCV 88.7 78.0 - 100.0 fL   MCH 28.2 26.0 - 34.0 pg   MCHC 31.8 30.0 - 36.0 g/dL   RDW 17.4 (H) 11.5 - 15.5 %   Platelets 162 150 - 400 K/uL   Neutrophils Relative % 71 %   Neutro Abs 3.9 1.7 - 7.7 K/uL   Lymphocytes Relative 19 %   Lymphs Abs 1.1 0.7 - 4.0 K/uL   Monocytes Relative 8 %   Monocytes Absolute 0.4 0.1 - 1.0 K/uL   Eosinophils  Relative 2 %   Eosinophils Absolute 0.1 0.0 - 0.7 K/uL   Basophils Relative 0 %   Basophils Absolute 0.0 0.0 - 0.1 K/uL  Troponin I (q 6hr x 3)     Status: Abnormal   Collection Time: 08/18/15  4:13 AM  Result Value Ref Range   Troponin I 0.08 (H) <0.031 ng/mL    Comment:        PERSISTENTLY INCREASED TROPONIN VALUES IN THE RANGE OF 0.04-0.49 ng/mL CAN BE SEEN IN:       -UNSTABLE ANGINA       -CONGESTIVE HEART FAILURE       -MYOCARDITIS       -CHEST TRAUMA       -ARRYHTHMIAS       -LATE PRESENTING MYOCARDIAL INFARCTION       -COPD   CLINICAL FOLLOW-UP RECOMMENDED.   Troponin I (q 6hr x 3)     Status: Abnormal   Collection Time: 08/18/15 11:12 AM  Result Value Ref Range   Troponin I 0.08 (H) <0.031 ng/mL    Comment:        PERSISTENTLY INCREASED TROPONIN VALUES IN THE RANGE OF 0.04-0.49 ng/mL CAN BE SEEN IN:       -UNSTABLE ANGINA       -CONGESTIVE HEART FAILURE       -MYOCARDITIS       -CHEST TRAUMA       -ARRYHTHMIAS       -LATE PRESENTING MYOCARDIAL INFARCTION       -COPD   CLINICAL FOLLOW-UP RECOMMENDED.   Protime-INR     Status: Abnormal   Collection Time: 08/18/15 11:12 AM  Result Value Ref Range   Prothrombin Time 29.6 (H) 11.6 - 15.2 seconds   INR 2.86 (H) 0.00 - 1.49  Protime-INR     Status: Abnormal   Collection Time: 08/19/15  3:48 AM  Result Value Ref Range   Prothrombin Time 31.4 (H) 11.6 - 15.2 seconds   INR 3.10 (H) 0.00 - 1.49  CBC  Status: Abnormal   Collection Time: 08/19/15  3:48 AM  Result Value Ref Range   WBC 5.6 4.0 - 10.5 K/uL   RBC 3.16 (L) 4.22 - 5.81 MIL/uL   Hemoglobin 9.1 (L) 13.0 - 17.0 g/dL   HCT 28.3 (L) 39.0 - 52.0 %   MCV 89.6 78.0 - 100.0 fL   MCH 28.8 26.0 - 34.0 pg   MCHC 32.2 30.0 - 36.0 g/dL   RDW 17.3 (H) 11.5 - 15.5 %   Platelets 155 150 - 400 K/uL  Comprehensive metabolic panel     Status: Abnormal   Collection Time: 08/19/15  3:48 AM  Result Value Ref Range   Sodium 140 135 - 145 mmol/L   Potassium 3.7 3.5 -  5.1 mmol/L   Chloride 99 (L) 101 - 111 mmol/L   CO2 28 22 - 32 mmol/L   Glucose, Bld 111 (H) 65 - 99 mg/dL   BUN 91 (H) 6 - 20 mg/dL   Creatinine, Ser 3.39 (H) 0.61 - 1.24 mg/dL   Calcium 9.8 8.9 - 10.3 mg/dL   Total Protein 7.3 6.5 - 8.1 g/dL   Albumin 4.0 3.5 - 5.0 g/dL   AST 34 15 - 41 U/L   ALT 17 17 - 63 U/L   Alkaline Phosphatase 103 38 - 126 U/L   Total Bilirubin 0.8 0.3 - 1.2 mg/dL   GFR calc non Af Amer 17 (L) >60 mL/min   GFR calc Af Amer 19 (L) >60 mL/min    Comment: (NOTE) The eGFR has been calculated using the CKD EPI equation. This calculation has not been validated in all clinical situations. eGFR's persistently <60 mL/min signify possible Chronic Kidney Disease.    Anion gap 13 5 - 15   Ct Chest Wo Contrast  08/18/2015  CLINICAL DATA:  Follow-up right pleural effusion. History of atrial fibrillation. EXAM: CT CHEST WITHOUT CONTRAST TECHNIQUE: Multidetector CT imaging of the chest was performed following the standard protocol without IV contrast. COMPARISON:  Chest radiograph 08/17/2015 and abdominal CT 05/01/2015 FINDINGS: Mediastinum/Lymph Nodes: No gross abnormality in the visualized thyroid tissue. No significant mediastinal or hilar lymphadenopathy but limited evaluation without intravenous contrast. Enlargement of the mid ascending thoracic aorta measuring up to 4.1 cm. Aneurysm of the proximal descending thoracic aorta measuring up to 4.8 cm. The mid descending thoracic aorta measures 4.0. The distal descending thoracic aorta measures 3.4 cm. Heart size is enlarged. No significant pericardial fluid. Lungs/Pleura: There is a moderate sized right pleural effusion with extensive volume loss throughout the right lower lobe. Minimal aeration in the right lower lobe. Scattered ground-glass densities in the left lower lobe probably represent areas of atelectasis. No clear evidence for pulmonary edema. Upper abdomen: Limited evaluation of the upper abdomen on this non contrast  examination. Fullness in left adrenal gland probably related to an adenoma. Probable exophytic cyst involving the left kidney pole Musculoskeletal: Laminectomy changes in lower thoracic spine. Multilevel degenerative disc disease in the thoracic spine. IMPRESSION: Moderate-sized right pleural effusion with near complete collapse of the right lower lobe. Limited evaluation of the right lower lobe due to volume loss. Cardiomegaly. Aneurysm of the descending thoracic aorta, measuring up to 4.8 cm. Aneurysm of the ascending thoracic aorta measuring up to 4.1 cm. Recommend semi-annual imaging followup by CTA or MRA and referral to cardiothoracic surgery if not already obtained. This recommendation follows 2010 ACCF/AHA/AATS/ACR/ASA/SCA/SCAI/SIR/STS/SVM Guidelines for the Diagnosis and Management of Patients With Thoracic Aortic Disease. Circulation. 2010; 121: [id number] Electronically Signed  By: Markus Daft M.D.   On: 08/18/2015 18:39   US Renal  08/18/2015  CLINICAL DATA:  Acute kidney injury, known chronic renal insufficiency. EXAM: RENAL / URINARY TRACT ULTRASOUND COMPLETE COMPARISON:  Noncontrast CT scan of the abdomen and pelvis dated May 01, 2015 and abdominal ultrasound of April 08, 2013 FINDINGS: Right Kidney: Length: 14.2 cm. The renal cortical echotexture is approximately equal to that of the liver. There is no hydronephrosis. There are multiple simple appearing cortical cysts. The largest cyst measures 2.5 x 1.9 x 2.2 cm. A midpole cyst measures 2.1 x 1.8 x 1.7 cm. Left Kidney: Length: 12.8 cm. The renal cortical echotexture is similar to that on the left. There are multiple cortical cysts. One in the upper pole measures 2.8 x 2.8 x 2.6 cm. A second cyst in the midpole measures 3.1 x 2.5 x 3 cm. There is no hydronephrosis. Bladder: The urinary bladder is decompressed due to postvoid state. There is a moderate-sized right pleural effusion. IMPRESSION: 1. Increased renal cortical echotexture which is  been previously demonstrated consistent with medical renal disease. No significant renal cortical atrophy is observed. There are multiple simple appearing cortical cysts bilaterally. There is no hydronephrosis. 2. There is a moderate-sized right pleural effusion. Electronically Signed   By: David  Martinique M.D.   On: 08/18/2015 11:27   Dg Chest 2v Repeat Same Day  08/17/2015  CLINICAL DATA:  Increased shortness of breath and leg swelling. EXAM: CHEST  2 VIEW COMPARISON:  08/17/2015 and 08/12/2015 and 04/15/2015 FINDINGS: Again noted are right basilar chest densities and suspect elevation of the right hemidiaphragm. There may be compressive atelectasis or airspace disease along the medial right lung base. Left lung remains clear. Heart and mediastinum are stable. Stable mild cardiomegaly. IMPRESSION: Persistent densities at the right lung base, particularly along the medial right lung base. Findings could represent a combination of consolidation and pleural fluid. This represents a significant change from the examination in 2016. Recommend follow-up to ensure improved aeration at the right lung base. If aeration does not improve, recommend further characterization with CT. Electronically Signed   By: Markus Daft M.D.   On: 08/17/2015 16:13    Review of Systems  Constitutional: Negative for fever and chills.  HENT: Negative for sore throat.   Eyes: Negative for blurred vision and double vision.  Respiratory: Positive for cough and shortness of breath. Negative for hemoptysis, sputum production and wheezing.   Cardiovascular: Positive for orthopnea, leg swelling and PND. Negative for chest pain and palpitations.  Gastrointestinal: Negative for heartburn, nausea, vomiting and abdominal pain.  Genitourinary: Negative for dysuria, urgency, frequency and flank pain.  Musculoskeletal: Negative for myalgias.  Skin: Negative for rash.  Neurological: Negative for dizziness and headaches.  Endo/Heme/Allergies:  Negative for polydipsia. Does not bruise/bleed easily.  Psychiatric/Behavioral: The patient is not nervous/anxious.     Blood pressure 126/69, pulse 55, temperature 98.6 F (37 C), temperature source Oral, resp. rate 16, height 6' (1.829 m), weight 287 lb 3.2 oz (130.273 kg), SpO2 99 %. Physical Exam  Constitutional: He is oriented to person, place, and time. He appears well-developed.  HENT:  Head: Normocephalic.  Eyes: Conjunctivae are normal. Right eye exhibits no discharge. Left eye exhibits no discharge. No scleral icterus.  Neck: Neck supple. JVD present.  Cardiovascular: An irregularly irregular rhythm present.  Murmur heard.  Systolic murmur is present with a grade of 2/6  Respiratory: No respiratory distress. He has no wheezes. He has no rales.  GI: Soft. Bowel sounds are normal. He exhibits no distension.  Musculoskeletal: He exhibits edema.  2+ pitting edema up to knees  Neurological: He is alert and oriented to person, place, and time. No cranial nerve deficit.  Skin: Skin is warm and dry. He is not diaphoretic.  Psychiatric: He has a normal mood and affect.     Assessment/Plan 1. Acute on chronic kidney disease stage IV: Patient's renal function noted to be progressively worsening over the last 2 weeks.  Baseline creatinine appears to be around 2.5, initially thought to be secondary to HTN. Renal ultrasound consistent with medical renal disease.  I suspect worsening function is secondary to CHF exacerbation, and there has been some improvement in SCr from 3.7 to 3.39.  No immediate indication for dialysis as electrolytes are normal and patient has adequate UOP, 2.1L in the last 24 hours (however I suspect he has much further to go).  - Will get FENurea given h/o diuretic use  - Fluid restrict to 1.2L daily  - continue to monitor UOP, electrolytes, and breathing status.  - if no improvement with pharmacologic interventions, could consider HD in the future in an non-emergent  basis.   2. Acute on chronic combined systolic and diastolic CHF exacerbation: Likely contributing on #1.  Repeat echo today with a slightly decreased EF to 40-45%. Patient's weight up overnight despite 2.1L out.  - advanced heart team following  -  Metalozone BID and possibly increasing Lasix to TID per their notes  - On isosorbide mononitrate, hydralazine, coreg, amlodipine. Not on ACE inhibitor due to kidney function in the past.  - given effusion and collapsing RLL, thoracentesis was ordered.  3. Normocytic Anemia: Hgb appears stable around 9.1, no evidence of active bleeding. Possibly anemia of CKD. Could be contributing to cardiac function.  - Obtain iron panel  4. Atrial fibrillation: currently rate controlled. On Coumadin per primary. No evidence of active bleeding.   5. Hypertension: BPs at goal. Continue with current regimen per primary team.  6. Gout: No active flare currently. On allopurinol. Renally dose colchine if patient has an acute flare in the future.     Kathrine Cords, MD [place] Resident, PGY-2 08/19/2015, 11:48 AM

## 2015-08-19 NOTE — Progress Notes (Signed)
Bruce Cole for Coumadin Indication: atrial fibrillation  Allergies  Allergen Reactions  . Ace Inhibitors Other (See Comments)    Worsening renal insufficiency    Patient Measurements: Height: 6' (182.9 cm) Weight: 287 lb 3.2 oz (130.273 kg) (scale b) IBW/kg (Calculated) : 77.6  Vital Signs: Temp: 98.6 F (37 C) (02/01 0456) Temp Source: Oral (02/01 0456) BP: 114/62 mmHg (02/01 0456) Pulse Rate: 51 (02/01 0456)  Labs:  Recent Labs  08/16/15 2350 08/17/15 1535 08/17/15 2248 08/18/15 0413 08/18/15 1112 08/19/15 0348  HGB 9.6* 9.2*  --  9.2*  --  9.1*  HCT 29.8* 28.7*  --  28.9*  --  28.3*  PLT 168 159  --  162  --  155  LABPROT 27.9*  --   --   --  29.6* 31.4*  INR 2.65*  --   --   --  2.86* 3.10*  CREATININE 3.51* 3.64*  --  3.70*  --  3.39*  TROPONINI 0.06* 0.08* 0.07* 0.08* 0.08*  --     Estimated Creatinine Clearance: 27.5 mL/min (by C-G formula based on Cr of 3.39).   Medical History: Past Medical History  Diagnosis Date  . Nephrolithiasis   . Arthritis     rheumatoid  . Colon polyp 2000  . Atrial fibrillation ([place])   . Unspecified essential hypertension   . Gout   . CHF (congestive heart failure) (HCC)     Assessment: 74 YOM on coumadin PTA for afib, admitted with SOB, legswelling, and DOE likely from CHF exacerbation. Pharmacy is consulted to manage coumadin dosing.  INR 3.1 today. No bleeding issues noted. Will give reduced dose tonight.  PTA dose 7.5 mg daily.  Goal of Therapy:  INR 2-3 Monitor platelets by anticoagulation protocol: Yes   Plan:  - Give Coumadin 5 mg today then back to home dose in am - Daily PT/INR for now  Erin Hearing PharmD., BCPS Clinical Pharmacist Pager [phone] 08/19/2015 8:16 AM

## 2015-08-19 NOTE — Procedures (Signed)
Ultrasound-guided diagnostic and therapeutic right thoracentesis performed yielding 1.5 liters of hazy, yellow fluid. No immediate complications. Follow-up chest x-ray pending. The fluid was sent to the lab for preordered studies.        

## 2015-08-20 DIAGNOSIS — Z9889 Other specified postprocedural states: Secondary | ICD-10-CM

## 2015-08-20 LAB — PH, BODY FLUID: PH, BODY FLUID: 7.9

## 2015-08-20 LAB — COMPREHENSIVE METABOLIC PANEL
ALBUMIN: 3.8 g/dL (ref 3.5–5.0)
ALT: 17 U/L (ref 17–63)
ANION GAP: 13 (ref 5–15)
AST: 36 U/L (ref 15–41)
Alkaline Phosphatase: 99 U/L (ref 38–126)
BUN: 96 mg/dL — AB (ref 6–20)
CHLORIDE: 101 mmol/L (ref 101–111)
CO2: 24 mmol/L (ref 22–32)
Calcium: 9.7 mg/dL (ref 8.9–10.3)
Creatinine, Ser: 3.49 mg/dL — ABNORMAL HIGH (ref 0.61–1.24)
GFR calc Af Amer: 19 mL/min — ABNORMAL LOW (ref >60)
GFR calc non Af Amer: 16 mL/min — ABNORMAL LOW (ref >60)
GLUCOSE: 106 mg/dL — AB (ref 65–99)
POTASSIUM: 3.6 mmol/L (ref 3.5–5.1)
SODIUM: 138 mmol/L (ref 135–145)
TOTAL PROTEIN: 6.9 g/dL (ref 6.5–8.1)
Total Bilirubin: 0.7 mg/dL (ref 0.3–1.2)

## 2015-08-20 LAB — CBC
HEMATOCRIT: 28.4 % — AB (ref 39.0–52.0)
Hemoglobin: 9.5 g/dL — ABNORMAL LOW (ref 13.0–17.0)
MCH: 29.6 pg (ref 26.0–34.0)
MCHC: 33.5 g/dL (ref 30.0–36.0)
MCV: 88.5 fL (ref 78.0–100.0)
PLATELETS: 130 10*3/uL — AB (ref 150–400)
RBC: 3.21 MIL/uL — ABNORMAL LOW (ref 4.22–5.81)
RDW: 17.3 % — AB (ref 11.5–15.5)
WBC: 6.1 10*3/uL (ref 4.0–10.5)

## 2015-08-20 LAB — UREA NITROGEN, URINE: Urea Nitrogen, Ur: 333 mg/dL

## 2015-08-20 LAB — PROTIME-INR
INR: 3.26 — AB (ref 0.00–1.49)
PROTHROMBIN TIME: 32.6 s — AB (ref 11.6–15.2)

## 2015-08-20 MED ORDER — WARFARIN SODIUM 2 MG PO TABS
1.0000 mg | ORAL_TABLET | Freq: Once | ORAL | Status: AC
  Administered 2015-08-20: 1 mg via ORAL
  Filled 2015-08-20: qty 0.5

## 2015-08-20 MED ORDER — SODIUM CHLORIDE 0.9 % IV SOLN
510.0000 mg | Freq: Once | INTRAVENOUS | Status: AC
  Administered 2015-08-20: 510 mg via INTRAVENOUS
  Filled 2015-08-20: qty 17

## 2015-08-20 MED ORDER — METOLAZONE 5 MG PO TABS: 5.0000 mg | ORAL_TABLET | Freq: Every day | ORAL | Status: DC

## 2015-08-20 MED ORDER — ZOLPIDEM TARTRATE 5 MG PO TABS
5.0000 mg | ORAL_TABLET | Freq: Every evening | ORAL | Status: DC | PRN
  Administered 2015-08-20: 5 mg via ORAL
  Filled 2015-08-20: qty 1

## 2015-08-20 MED ORDER — CARVEDILOL 3.125 MG PO TABS
3.1250 mg | ORAL_TABLET | Freq: Two times a day (BID) | ORAL | Status: DC
  Administered 2015-08-20 – 2015-08-28 (×13): 3.125 mg via ORAL
  Filled 2015-08-20 (×18): qty 1

## 2015-08-20 NOTE — Progress Notes (Signed)
[facility] PROGRESS NOTE   THEODORO BRECKER [name] DOB: 1941-01-10 DOA: 08/17/2015 PCP: Nance Pear., NP  HPI/Subjective: Feels better, denies shortness of breath.  Assessment/Plan: Principal Problem:   Acute on chronic combined systolic (congestive) and diastolic (congestive) heart failure (HCC) Active Problems:   Essential hypertension   ATRIAL FIBRILLATION   Diabetes mellitus type 2, controlled, with complications ([place])   Long term (current) use of anticoagulants   CKD (chronic kidney disease), stage IV (HCC)   Acute renal failure superimposed on stage 3 chronic kidney disease (HCC)   Pleural effusion on right   Acute on chronic combined systolic and diastolic CHF exacerbation ([place]):  -Patient with increased weight gain not relieved by po Lasix. Patient with +2 pitting edema lower extremity.  -BNP elevated at 204.1 on admission. Last EF 45-50% showing combined systolic and diastolic dysfunction back in 03/2013. -Consult cardiology patient is seen by Dr. Haroldine Laws -Echocardiogram in a.m. -Follow intake/output, daily weight, elevate legs and salt and fluid restriction. -Continue metolazone, Lasix, isosorbide mononitrate and hydralazine.  Acute renal failure on chronic kidney disease stage IV -Patient's kidney function has progressively worsened over the last 2 weeks from 2.59 on 1/17 to 3.64 on admission. -Renal ultrasound showed medical renal disease. -Nephrology consulted, recommended to monitor urine output and renal function.  Diabetes mellitus type 2 -Controlled diabetes mellitus type 2, hemoglobin A1c is 6.7 on 08/04/15. -Carbohydrate modified diet and insulin sliding scale while was in the hospital.  Atrial fibrillation on Coumadin -INR therapeutic at 2.65 in patient appears to be rate controlled at this time. CHA2DS2-VASc score 4. -Consult pharmacy to dose Coumadin  Hypertension -Continue amlodipine, Coreg, and other blood pressure  medications as listed above  Question of acute urinary retention -Check bladder scan and place Foley if greater than 300 mL of fluid present.   Code Status: Full Code Family Communication: Plan discussed with the patient. Disposition Plan: Remains inpatient Diet: Diet Heart Room service appropriate?: Yes; Fluid consistency:: Thin; Fluid restriction:: 1200 mL Fluid  Consultants:  Heart failure team  Procedures:  None  Antibiotics:  None   Objective: Filed Vitals:   08/20/15 0618 08/20/15 1154  BP: 141/67 95/47  Pulse: 49 44  Temp: 98.3 F (36.8 C) 97.7 F (36.5 C)  Resp: 20 20    Intake/Output Summary (Last 24 hours) at 08/20/15 1258 Last data filed at 08/20/15 1147  Gross per 24 hour  Intake    950 ml  Output   2000 ml  Net  -1050 ml   Filed Weights   08/19/15 0456 08/20/15 0618 08/20/15 0746  Weight: 130.273 kg (287 lb 3.2 oz) 108.001 kg (238 lb 1.6 oz) 128.459 kg (283 lb 3.2 oz)    Exam: General: Alert and awake, oriented x3, not in any acute distress. HEENT: anicteric sclera, pupils reactive to light and accommodation, EOMI CVS: S1-S2 clear, no murmur rubs or gallops Chest: clear to auscultation bilaterally, no wheezing, rales or rhonchi Abdomen: soft nontender, nondistended, normal bowel sounds, no organomegaly Extremities: no cyanosis, has +2 edema noted bilaterally Neuro: Cranial nerves II-XII intact, no focal neurological deficits  Data Reviewed: Basic Metabolic Panel:  Recent Labs Lab 08/16/15 2350 08/17/15 1535 08/18/15 0413 08/19/15 0348 08/20/15 0500  NA 139 139 143 140 138  K 4.0 4.0 3.8 3.7 3.6  CL 101 102 103 99* 101  CO2 24 24 27 28 24   GLUCOSE 126* 126* 92 111* 106*  BUN 89* 91* 91* 91* 96*  CREATININE 3.51* 3.64* 3.70* 3.39*  3.49*  CALCIUM 10.0 9.6 10.0 9.8 9.7   Liver Function Tests:  Recent Labs Lab 08/19/15 0348 08/20/15 0500  AST 34 36  ALT 17 17  ALKPHOS 103 99  BILITOT 0.8 0.7  PROT 7.3 6.9  ALBUMIN 4.0 3.8     No results for input(s): LIPASE, AMYLASE in the last 168 hours. No results for input(s): AMMONIA in the last 168 hours. CBC:  Recent Labs Lab 08/16/15 2350 08/17/15 1535 08/18/15 0413 08/19/15 0348 08/20/15 0500  WBC 5.8 6.1 5.5 5.6 6.1  NEUTROABS 4.0 4.8 3.9  --   --   HGB 9.6* 9.2* 9.2* 9.1* 9.5*  HCT 29.8* 28.7* 28.9* 28.3* 28.4*  MCV 89.5 89.1 88.7 89.6 88.5  PLT 168 159 162 155 130*   Cardiac Enzymes:  Recent Labs Lab 08/16/15 2350 08/17/15 1535 08/17/15 2248 08/18/15 0413 08/18/15 1112  TROPONINI 0.06* 0.08* 0.07* 0.08* 0.08*   BNP (last 3 results)  Recent Labs  08/12/15 1930 08/16/15 2350 08/17/15 1535  BNP 178.6* 174.2* 204.1*    ProBNP (last 3 results) No results for input(s): PROBNP in the last 8760 hours.  CBG: No results for input(s): GLUCAP in the last 168 hours.  Micro Recent Results (from the past 240 hour(s))  Culture, body fluid-bottle     Status: None (Preliminary result)   Collection Time: 08/19/15  1:45 PM  Result Value Ref Range Status   Specimen Description FLUID RIGHT PLEURAL  Final   Special Requests BOTTLES DRAWN AEROBIC AND ANAEROBIC 10CCS  Final   Culture PENDING  Incomplete   Report Status PENDING  Incomplete  Gram stain     Status: None   Collection Time: 08/19/15  1:45 PM  Result Value Ref Range Status   Specimen Description FLUID RIGHT PLEURAL  Final   Special Requests NONE  Final   Gram Stain   Final    FEW WBC PRESENT, PREDOMINANTLY PMN NO ORGANISMS SEEN    Report Status 08/19/2015 FINAL  Final     Studies: Dg Chest 1 View  08/19/2015  CLINICAL DATA:  Status post right-sided thoracentesis. EXAM: CHEST 1 VIEW COMPARISON:  August 17, 2015. FINDINGS: Stable cardiomegaly is noted. No pneumothorax or significant pleural effusion is noted. No acute pulmonary disease is noted. Bony thorax is unremarkable. IMPRESSION: Stable cardiomegaly.  No other significant abnormality seen. Electronically Signed   By: Marijo Conception, M.D.   On: 08/19/2015 14:22   Ct Chest Wo Contrast  08/18/2015  CLINICAL DATA:  Follow-up right pleural effusion. History of atrial fibrillation. EXAM: CT CHEST WITHOUT CONTRAST TECHNIQUE: Multidetector CT imaging of the chest was performed following the standard protocol without IV contrast. COMPARISON:  Chest radiograph 08/17/2015 and abdominal CT 05/01/2015 FINDINGS: Mediastinum/Lymph Nodes: No gross abnormality in the visualized thyroid tissue. No significant mediastinal or hilar lymphadenopathy but limited evaluation without intravenous contrast. Enlargement of the mid ascending thoracic aorta measuring up to 4.1 cm. Aneurysm of the proximal descending thoracic aorta measuring up to 4.8 cm. The mid descending thoracic aorta measures 4.0. The distal descending thoracic aorta measures 3.4 cm. Heart size is enlarged. No significant pericardial fluid. Lungs/Pleura: There is a moderate sized right pleural effusion with extensive volume loss throughout the right lower lobe. Minimal aeration in the right lower lobe. Scattered ground-glass densities in the left lower lobe probably represent areas of atelectasis. No clear evidence for pulmonary edema. Upper abdomen: Limited evaluation of the upper abdomen on this non contrast examination. Fullness in left adrenal gland probably  related to an adenoma. Probable exophytic cyst involving the left kidney pole Musculoskeletal: Laminectomy changes in lower thoracic spine. Multilevel degenerative disc disease in the thoracic spine. IMPRESSION: Moderate-sized right pleural effusion with near complete collapse of the right lower lobe. Limited evaluation of the right lower lobe due to volume loss. Cardiomegaly. Aneurysm of the descending thoracic aorta, measuring up to 4.8 cm. Aneurysm of the ascending thoracic aorta measuring up to 4.1 cm. Recommend semi-annual imaging followup by CTA or MRA and referral to cardiothoracic surgery if not already obtained. This  recommendation follows 2010 ACCF/AHA/AATS/ACR/ASA/SCA/SCAI/SIR/STS/SVM Guidelines for the Diagnosis and Management of Patients With Thoracic Aortic Disease. Circulation. 2010; 121PV:2030509 Electronically Signed   By: Markus Daft M.D.   On: 08/18/2015 18:39   US Thoracentesis Asp Pleural Space W/img Guide  08/19/2015  INDICATION: CHF, chronic kidney disease, dyspnea, right pleural effusion. Request is made for diagnostic and therapeutic right thoracentesis. EXAM: ULTRASOUND GUIDED DIAGNOSTIC AND THERAPEUTIC RIGHT THORACENTESIS MEDICATIONS: None. COMPLICATIONS: None immediate. PROCEDURE: An ultrasound guided thoracentesis was thoroughly discussed with the patient and questions answered. The benefits, risks, alternatives and complications were also discussed. The patient understands and wishes to proceed with the procedure. Written consent was obtained. Ultrasound was performed to localize and mark an adequate pocket of fluid in the right chest. The area was then prepped and draped in the normal sterile fashion. 1% Lidocaine was used for local anesthesia. Under ultrasound guidance a Safe-T-Centesis catheter was introduced. Thoracentesis was performed. The catheter was removed and a dressing applied. FINDINGS: A total of approximately 1.5 liters of hazy, yellow fluid was removed. Samples were sent to the laboratory as requested by the clinical team. IMPRESSION: Successful ultrasound guided diagnostic and therapeutic right thoracentesis yielding 1.5 liters of pleural fluid. Read by: Rowe Robert, PA-C Electronically Signed   By: Jerilynn Mages.  Shick M.D.   On: 08/19/2015 14:08    Scheduled Meds: . allopurinol  200 mg Oral Daily  . amLODipine  10 mg Oral Daily  . carvedilol  3.125 mg Oral BID  . furosemide  80 mg Intravenous TID  . guaiFENesin  600 mg Oral BID  . hydrALAZINE  50 mg Oral BID  . hydroxychloroquine  200 mg Oral BID  . isosorbide mononitrate  30 mg Oral Daily  . [START ON 08/21/2015] metolazone  5 mg Oral  Daily  . multivitamin with minerals  1 tablet Oral Daily  . sodium chloride flush  3 mL Intravenous Q12H  . terazosin  1 mg Oral QHS  . warfarin  7.5 mg Oral q1800  . Warfarin - Pharmacist Dosing Inpatient   Does not apply q1800   Continuous Infusions:      Time spent: 35 minutes    [facility] A  [facility] Pager [phone] If 7PM-7AM, please contact night-coverage at [email or web address], password [facility] 08/20/2015, 12:58 PM  LOS: 3 days

## 2015-08-20 NOTE — Progress Notes (Signed)
Pt a/o, c/o cough 1x order for tussionex given as ordered, pt also requested something for sleep, PRN Ambien given as ordered, pt resting comfortably, VSS, pt stable

## 2015-08-20 NOTE — Care Management Important Message (Signed)
Important Message  Patient Details  Name: Bruce Cole MRN: [name] Date of Birth: 1940-10-19   Medicare Important Message Given:  Yes    Raisha Brabender P Shiza Thelen 08/20/2015, 1:10 PM

## 2015-08-20 NOTE — Progress Notes (Signed)
Advanced Heart Failure Rounding Note Referring Physician: Dr Allyson Sabal Primary Physician: Debbrah Alar, NP Primary Cardiologist: Dr. Haroldine Laws   Reason for Consultation: A/C diastolic HF  Subjective:    S/p R thoracentesis 08/19/15 with 1.5 L of hazy, yellow fluid. Transudate by labs.  Out 3 L so far this admission. Weight down 4 lbs. SOB and coughing improved with thoracentesis yesterday as above.   Denies CP, lightheadedness, or dizziness.   Echo 08/18/15 - LVEF 40-45%, diffuse hypokinesis, severe TR, severe RAE, RV severely dilated, mild/moderately reduced  Chest CT 08/18/15 -  1. Moderate-sized right pleural effusion with near complete collapse of the RLL. Limited evaluation of the RLL due to volume loss.  2. Cardiomegaly 3. Aneurysm of the descending thoracic aorta, measuring up to 4.8 cm. Aneurysm of the ascending thoracic aorta measuring up to 4.1 cm. (recommend semi-annual follow up.)  Objective:   Weight Range: 283 lb 3.2 oz (128.459 kg) Body mass index is 38.4 kg/(m^2).   Vital Signs:   Temp:  [97.4 F (36.3 C)-98.3 F (36.8 C)] 98.3 F (36.8 C) (02/02 0618) Pulse Rate:  [45-49] 49 (02/02 0618) Resp:  [20] 20 (02/02 0618) BP: (111-141)/(64-68) 141/67 mmHg (02/02 0618) SpO2:  [94 %-97 %] 95 % (02/02 0618) Weight:  [238 lb 1.6 oz (108.001 kg)-283 lb 3.2 oz (128.459 kg)] 283 lb 3.2 oz (128.459 kg) (02/02 0746) Last BM Date: 08/19/15  Weight change: Filed Weights   08/19/15 0456 08/20/15 0618 08/20/15 0746  Weight: 287 lb 3.2 oz (130.273 kg) 238 lb 1.6 oz (108.001 kg) 283 lb 3.2 oz (128.459 kg)    Intake/Output:   Intake/Output Summary (Last 24 hours) at 08/20/15 1123 Last data filed at 08/20/15 1021  Gross per 24 hour  Intake    950 ml  Output   1900 ml  Net   -950 ml     Physical Exam: General: Well appearing. No resp difficulty. Lying in bed HEENT: normal Neck: supple. JVP to 10+. Carotids 2+ bilaterally; no bruits. No thyromegaly or nodule  noted. Cor: PMI normal. Bradycardic, irregularly irregular. No rubs, gallops. 2/6 early SEM RUSB Lungs: Diminished bases Abdomen: soft, nontender, nondistended, no HSM. No bruits or masses. +BS  Extremities: no cyanosis, clubbing, rash. 1-2+ peripheral edema into thighs.  Neuro: alert & orientedx3, cranial nerves grossly intact. Moves all 4 extremities w/o difficulty. Affect pleasant.  Telemetry: Reviewed personally, afib 40-50s  Labs: CBC  Recent Labs  08/17/15 1535 08/18/15 0413 08/19/15 0348 08/20/15 0500  WBC 6.1 5.5 5.6 6.1  NEUTROABS 4.8 3.9  --   --   HGB 9.2* 9.2* 9.1* 9.5*  HCT 28.7* 28.9* 28.3* 28.4*  MCV 89.1 88.7 89.6 88.5  PLT 159 162 155 [id number]   Basic Metabolic Panel  Recent Labs  08/19/15 0348 08/20/15 0500  NA 140 138  K 3.7 3.6  CL 99* 101  CO2 28 24  GLUCOSE 111* 106*  BUN 91* 96*  CREATININE 3.39* 3.49*  CALCIUM 9.8 9.7   Liver Function Tests  Recent Labs  08/19/15 0348 08/20/15 0500  AST 34 36  ALT 17 17  ALKPHOS 103 99  BILITOT 0.8 0.7  PROT 7.3 6.9  ALBUMIN 4.0 3.8   No results for input(s): LIPASE, AMYLASE in the last 72 hours. Cardiac Enzymes  Recent Labs  08/17/15 2248 08/18/15 0413 08/18/15 1112  TROPONINI 0.07* 0.08* 0.08*    BNP: BNP (last 3 results)  Recent Labs  08/12/15 1930 08/16/15 2350 08/17/15 1535  BNP  178.6* 174.2* 204.1*    ProBNP (last 3 results) No results for input(s): PROBNP in the last 8760 hours.   D-Dimer No results for input(s): DDIMER in the last 72 hours. Hemoglobin A1C No results for input(s): HGBA1C in the last 72 hours. Fasting Lipid Panel No results for input(s): CHOL, HDL, LDLCALC, TRIG, CHOLHDL, LDLDIRECT in the last 72 hours. Thyroid Function Tests No results for input(s): TSH, T4TOTAL, T3FREE, THYROIDAB in the last 72 hours.  Invalid input(s): FREET3  Other results:     Imaging/Studies:  Dg Chest 1 View  08/19/2015  CLINICAL DATA:  Status post right-sided  thoracentesis. EXAM: CHEST 1 VIEW COMPARISON:  August 17, 2015. FINDINGS: Stable cardiomegaly is noted. No pneumothorax or significant pleural effusion is noted. No acute pulmonary disease is noted. Bony thorax is unremarkable. IMPRESSION: Stable cardiomegaly.  No other significant abnormality seen. Electronically Signed   By: Marijo Conception, M.D.   On: 08/19/2015 14:22   Ct Chest Wo Contrast  08/18/2015  CLINICAL DATA:  Follow-up right pleural effusion. History of atrial fibrillation. EXAM: CT CHEST WITHOUT CONTRAST TECHNIQUE: Multidetector CT imaging of the chest was performed following the standard protocol without IV contrast. COMPARISON:  Chest radiograph 08/17/2015 and abdominal CT 05/01/2015 FINDINGS: Mediastinum/Lymph Nodes: No gross abnormality in the visualized thyroid tissue. No significant mediastinal or hilar lymphadenopathy but limited evaluation without intravenous contrast. Enlargement of the mid ascending thoracic aorta measuring up to 4.1 cm. Aneurysm of the proximal descending thoracic aorta measuring up to 4.8 cm. The mid descending thoracic aorta measures 4.0. The distal descending thoracic aorta measures 3.4 cm. Heart size is enlarged. No significant pericardial fluid. Lungs/Pleura: There is a moderate sized right pleural effusion with extensive volume loss throughout the right lower lobe. Minimal aeration in the right lower lobe. Scattered ground-glass densities in the left lower lobe probably represent areas of atelectasis. No clear evidence for pulmonary edema. Upper abdomen: Limited evaluation of the upper abdomen on this non contrast examination. Fullness in left adrenal gland probably related to an adenoma. Probable exophytic cyst involving the left kidney pole Musculoskeletal: Laminectomy changes in lower thoracic spine. Multilevel degenerative disc disease in the thoracic spine. IMPRESSION: Moderate-sized right pleural effusion with near complete collapse of the right lower lobe.  Limited evaluation of the right lower lobe due to volume loss. Cardiomegaly. Aneurysm of the descending thoracic aorta, measuring up to 4.8 cm. Aneurysm of the ascending thoracic aorta measuring up to 4.1 cm. Recommend semi-annual imaging followup by CTA or MRA and referral to cardiothoracic surgery if not already obtained. This recommendation follows 2010 ACCF/AHA/AATS/ACR/ASA/SCA/SCAI/SIR/STS/SVM Guidelines for the Diagnosis and Management of Patients With Thoracic Aortic Disease. Circulation. 2010; 121LT:4564967 Electronically Signed   By: Markus Daft M.D.   On: 08/18/2015 18:39   US Thoracentesis Asp Pleural Space W/img Guide  08/19/2015  INDICATION: CHF, chronic kidney disease, dyspnea, right pleural effusion. Request is made for diagnostic and therapeutic right thoracentesis. EXAM: ULTRASOUND GUIDED DIAGNOSTIC AND THERAPEUTIC RIGHT THORACENTESIS MEDICATIONS: None. COMPLICATIONS: None immediate. PROCEDURE: An ultrasound guided thoracentesis was thoroughly discussed with the patient and questions answered. The benefits, risks, alternatives and complications were also discussed. The patient understands and wishes to proceed with the procedure. Written consent was obtained. Ultrasound was performed to localize and mark an adequate pocket of fluid in the right chest. The area was then prepped and draped in the normal sterile fashion. 1% Lidocaine was used for local anesthesia. Under ultrasound guidance a Safe-T-Centesis catheter was introduced. Thoracentesis was performed.  The catheter was removed and a dressing applied. FINDINGS: A total of approximately 1.5 liters of hazy, yellow fluid was removed. Samples were sent to the laboratory as requested by the clinical team. IMPRESSION: Successful ultrasound guided diagnostic and therapeutic right thoracentesis yielding 1.5 liters of pleural fluid. Read by: Rowe Robert, PA-C Electronically Signed   By: Jerilynn Mages.  Shick M.D.   On: 08/19/2015 14:08    Latest Echo  Latest  Cath   Medications:     Scheduled Medications: . allopurinol  200 mg Oral Daily  . amLODipine  10 mg Oral Daily  . carvedilol  3.125 mg Oral BID  . furosemide  80 mg Intravenous TID  . guaiFENesin  600 mg Oral BID  . hydrALAZINE  50 mg Oral BID  . hydroxychloroquine  200 mg Oral BID  . isosorbide mononitrate  30 mg Oral Daily  . [START ON 08/21/2015] metolazone  5 mg Oral Daily  . multivitamin with minerals  1 tablet Oral Daily  . sodium chloride flush  3 mL Intravenous Q12H  . terazosin  1 mg Oral QHS  . warfarin  7.5 mg Oral q1800  . Warfarin - Pharmacist Dosing Inpatient   Does not apply q1800    Infusions:    PRN Medications: sodium chloride, acetaminophen, albuterol, ondansetron (ZOFRAN) IV, oxymetazoline, sodium chloride flush   Assessment   1. Chronic combined HF - Echo 03/2013 45-50% with grade 2 DD, PA peak 60 mm Hg 2. AKI on CKD III 3. Chronic afib on coumadin 4. Pleural effusion on right  Plan    Echo 08/18/15 - LVEF 40-45%, diffuse hypokinesis, severe TR, severe RAE, RV severely dilated, mild/moderately reduced, PA peak pressure 44 mm Hg.   He remains volume overloaded on exam. Continue IV lasix. Decrease metolazone to once daily per Renal.  Dr. Haroldine Laws spoke with Dr Posey Pronto of Renal.  Pt will likely need Dialysis in the next 6-12 months. BUN remains elevated > 90.  Will initiate process with vein mapping.   Afib remains stable in rate of 50s. No bleeding on coumadin.   CT Chest with moderate effusion collapsing RLL.  Thoracentesis with 1.5 L out. Feels much better.  Length of Stay: 3   Shirley Friar PA-C 08/20/2015, 11:23 AM  Advanced Heart Failure Team Pager [phone] (M-F; 7a - 4p)  Please contact [facility] for night-coverage after hours (4p -7a ) and weekends on [email or web address]  Patient seen and examined with Oda Kilts, PA-C. We discussed all aspects of the encounter. I agree with the assessment and plan as stated above.   Breathing  better after thoracentesis - fluid analysis c/w transudate. Remains volume overloaded. Diuresing slowly. Creatinine stable bunt BUN increasing. Now 96. I doubt he will need acute HD but suspect he will progress in next 6-12 months. Discussed with Dr. Posey Pronto. Will proceed with vein mapping and possible access placement this admission.   Yuya Vanwingerden,MD 3:05 PM

## 2015-08-20 NOTE — Progress Notes (Signed)
[place] for Coumadin Indication: atrial fibrillation  Allergies  Allergen Reactions  . Ace Inhibitors Other (See Comments)    Worsening renal insufficiency    Patient Measurements: Height: 6' (182.9 cm) Weight: 283 lb 3.2 oz (128.459 kg) IBW/kg (Calculated) : 77.6  Vital Signs: Temp: 97.7 F (36.5 C) (02/02 1154) Temp Source: Oral (02/02 1154) BP: 95/47 mmHg (02/02 1154) Pulse Rate: 44 (02/02 1154)  Labs:  Recent Labs  08/17/15 2248 08/18/15 0413 08/18/15 1112 08/19/15 0348 08/20/15 0500  HGB  --  9.2*  --  9.1* 9.5*  HCT  --  28.9*  --  28.3* 28.4*  PLT  --  162  --  155 130*  LABPROT  --   --  29.6* 31.4* 32.6*  INR  --   --  2.86* 3.10* 3.26*  CREATININE  --  3.70*  --  3.39* 3.49*  TROPONINI 0.07* 0.08* 0.08*  --   --     Estimated Creatinine Clearance: 26.5 mL/min (by C-G formula based on Cr of 3.49).   Medical History: Past Medical History  Diagnosis Date  . Nephrolithiasis   . Arthritis     rheumatoid  . Colon polyp 2000  . Atrial fibrillation ([place])   . Unspecified essential hypertension   . Gout   . CHF (congestive heart failure) (HCC)     Assessment: 74 YOM on coumadin PTA for afib, admitted with SOB, leg swelling, and DOE likely from CHF exacerbation. Pharmacy is consulted to manage Coumadin dosing.  INR up, 3.26 today. No bleeding issues noted. Will give reduced dose tonight.  PTA dose 7.5 mg daily.  Goal of Therapy:  INR 2-3 Monitor platelets by anticoagulation protocol: Yes   Plan:  - Give Coumadin 1mg  x 1 tonight.  Hoping INR will drop a little tomorrow. - Daily PT/INR for now  Uvaldo Rising BCPS  Clinical Pharmacist Pager [phone]  08/20/2015 2:50 PM

## 2015-08-20 NOTE — Progress Notes (Signed)
[facility] Progress Note    Assessment/ Plan:   1. Acute on chronic kidney disease stage IV: Patient with h/o CKD from underlying hypertensive nephrosclerosis, NSAID nephropathy, and chronic cardiorenal syndrome presenting with worsening renal function in the setting of an acute CHF exacerbation which is the most likely etiology. Urine urea still pending for FEurea. UOP worse today (net -775mL in the last 24hrs). Has dropped 4 pounds overnight which seems correct with the thoracentesis of 1.5L and UOP of 1.7L. Still not compelling indication for emergent dialysis. Will continue to monitor SCr, electrolytes, and clinic improvement. Of note, currently no fluid restriction on diet order, will add 1250mL fluid restriction.   2. Acute on chronic combined systolic and diastolic CHF exacerbation: Likely contributing on #1. Repeat echo this hospitalization with a slightly decreased EF to 40-45%. Has dropped 4 pounds in the last 24hrs.  Advanced heart team following. Continue metalozone BID and  Lasix to TID.  Continue coreg, not on ACE inhibitor due to worsening kidney function in the past.    3. Normocytic Anemia: Hgb appears stable around 9.1, no evidence of active bleeding. Noted to be iron deficient on recent labwork. Will start IV iron.   4. Atrial fibrillation: currently rate controlled. On Coumadin per primary. No evidence of active bleeding.   5. Hypertension: BPs generally at goal. Continue with current regimen per primary team.  6. Gout: No active flare currently. On allopurinol. Renally dose colchine if patient has an acute flare in the future.  Subjective:   Patient doing well s/p thoracentesis. Breathing more comfortably, LE swelling improving per patient.    Objective:   BP 141/67 mmHg  Pulse 49  Temp(Src) 98.3 F (36.8 C) (Oral)  Resp 20  Ht 6' (1.829 m)  Wt 238 lb 1.6 oz (108.001 kg)  BMI 32.28 kg/m2  SpO2 95%  Intake/Output Summary (Last 24 hours) at  08/20/15 0714 Last data filed at 08/20/15 0620  Gross per 24 hour  Intake    950 ml  Output   1725 ml  Net   -775 ml   Weight change: -49 lb 1.6 oz (-22.272 kg)  Physical Exam: General: Lying in bed in NAD. Non-toxic Cardiovascular: Bradycardic, irregularly irregular rhythm. II/VI systolic murmur noted. No rubs, or gallops noted. 1-2+ pitting edema noted. Respiratory: No increased WOB. Fine crackles noted over the R lung base. Abdomen: +BS, soft, non-distended, non-tender.    Imaging: Dg Chest 1 View  08/19/2015  CLINICAL DATA:  Status post right-sided thoracentesis. EXAM: CHEST 1 VIEW COMPARISON:  August 17, 2015. FINDINGS: Stable cardiomegaly is noted. No pneumothorax or significant pleural effusion is noted. No acute pulmonary disease is noted. Bony thorax is unremarkable. IMPRESSION: Stable cardiomegaly.  No other significant abnormality seen. Electronically Signed   By: Marijo Conception, M.D.   On: 08/19/2015 14:22   Ct Chest Wo Contrast  08/18/2015  CLINICAL DATA:  Follow-up right pleural effusion. History of atrial fibrillation. EXAM: CT CHEST WITHOUT CONTRAST TECHNIQUE: Multidetector CT imaging of the chest was performed following the standard protocol without IV contrast. COMPARISON:  Chest radiograph 08/17/2015 and abdominal CT 05/01/2015 FINDINGS: Mediastinum/Lymph Nodes: No gross abnormality in the visualized thyroid tissue. No significant mediastinal or hilar lymphadenopathy but limited evaluation without intravenous contrast. Enlargement of the mid ascending thoracic aorta measuring up to 4.1 cm. Aneurysm of the proximal descending thoracic aorta measuring up to 4.8 cm. The mid descending thoracic aorta measures 4.0. The distal descending thoracic aorta measures 3.4 cm. Heart  size is enlarged. No significant pericardial fluid. Lungs/Pleura: There is a moderate sized right pleural effusion with extensive volume loss throughout the right lower lobe. Minimal aeration in the right lower  lobe. Scattered ground-glass densities in the left lower lobe probably represent areas of atelectasis. No clear evidence for pulmonary edema. Upper abdomen: Limited evaluation of the upper abdomen on this non contrast examination. Fullness in left adrenal gland probably related to an adenoma. Probable exophytic cyst involving the left kidney pole Musculoskeletal: Laminectomy changes in lower thoracic spine. Multilevel degenerative disc disease in the thoracic spine. IMPRESSION: Moderate-sized right pleural effusion with near complete collapse of the right lower lobe. Limited evaluation of the right lower lobe due to volume loss. Cardiomegaly. Aneurysm of the descending thoracic aorta, measuring up to 4.8 cm. Aneurysm of the ascending thoracic aorta measuring up to 4.1 cm. Recommend semi-annual imaging followup by CTA or MRA and referral to cardiothoracic surgery if not already obtained. This recommendation follows 2010 ACCF/AHA/AATS/ACR/ASA/SCA/SCAI/SIR/STS/SVM Guidelines for the Diagnosis and Management of Patients With Thoracic Aortic Disease. Circulation. 2010; [id number] Electronically Signed   By: Markus Daft M.D.   On: 08/18/2015 18:39   US Renal  08/18/2015  CLINICAL DATA:  Acute kidney injury, known chronic renal insufficiency. EXAM: RENAL / URINARY TRACT ULTRASOUND COMPLETE COMPARISON:  Noncontrast CT scan of the abdomen and pelvis dated May 01, 2015 and abdominal ultrasound of April 08, 2013 FINDINGS: Right Kidney: Length: 14.2 cm. The renal cortical echotexture is approximately equal to that of the liver. There is no hydronephrosis. There are multiple simple appearing cortical cysts. The largest cyst measures 2.5 x 1.9 x 2.2 cm. A midpole cyst measures 2.1 x 1.8 x 1.7 cm. Left Kidney: Length: 12.8 cm. The renal cortical echotexture is similar to that on the left. There are multiple cortical cysts. One in the upper pole measures 2.8 x 2.8 x 2.6 cm. A second cyst in the midpole measures 3.1 x  2.5 x 3 cm. There is no hydronephrosis. Bladder: The urinary bladder is decompressed due to postvoid state. There is a moderate-sized right pleural effusion. IMPRESSION: 1. Increased renal cortical echotexture which is been previously demonstrated consistent with medical renal disease. No significant renal cortical atrophy is observed. There are multiple simple appearing cortical cysts bilaterally. There is no hydronephrosis. 2. There is a moderate-sized right pleural effusion. Electronically Signed   By: David  Martinique M.D.   On: 08/18/2015 11:27   US Thoracentesis Asp Pleural Space W/img Guide  08/19/2015  INDICATION: CHF, chronic kidney disease, dyspnea, right pleural effusion. Request is made for diagnostic and therapeutic right thoracentesis. EXAM: ULTRASOUND GUIDED DIAGNOSTIC AND THERAPEUTIC RIGHT THORACENTESIS MEDICATIONS: None. COMPLICATIONS: None immediate. PROCEDURE: An ultrasound guided thoracentesis was thoroughly discussed with the patient and questions answered. The benefits, risks, alternatives and complications were also discussed. The patient understands and wishes to proceed with the procedure. Written consent was obtained. Ultrasound was performed to localize and mark an adequate pocket of fluid in the right chest. The area was then prepped and draped in the normal sterile fashion. 1% Lidocaine was used for local anesthesia. Under ultrasound guidance a Safe-T-Centesis catheter was introduced. Thoracentesis was performed. The catheter was removed and a dressing applied. FINDINGS: A total of approximately 1.5 liters of hazy, yellow fluid was removed. Samples were sent to the laboratory as requested by the clinical team. IMPRESSION: Successful ultrasound guided diagnostic and therapeutic right thoracentesis yielding 1.5 liters of pleural fluid. Read by: Rowe Robert, PA-C Electronically Signed  By: Eugenie Filler M.D.   On: 08/19/2015 14:08    Labs: BMET  Recent Labs Lab 08/16/15 2350  08/17/15 1535 08/18/15 0413 08/19/15 0348 08/20/15 0500  NA 139 139 143 140 138  K 4.0 4.0 3.8 3.7 3.6  CL 101 102 103 99* 101  CO2 24 24 27 28 24   GLUCOSE 126* 126* 92 111* 106*  BUN 89* 91* 91* 91* 96*  CREATININE 3.51* 3.64* 3.70* 3.39* 3.49*  CALCIUM 10.0 9.6 10.0 9.8 9.7   CBC  Recent Labs Lab 08/16/15 2350 08/17/15 1535 08/18/15 0413 08/19/15 0348 08/20/15 0500  WBC 5.8 6.1 5.5 5.6 6.1  NEUTROABS 4.0 4.8 3.9  --   --   HGB 9.6* 9.2* 9.2* 9.1* 9.5*  HCT 29.8* 28.7* 28.9* 28.3* 28.4*  MCV 89.5 89.1 88.7 89.6 88.5  PLT 168 159 162 155 130*    Medications:    . allopurinol  200 mg Oral Daily  . amLODipine  10 mg Oral Daily  . carvedilol  6.25 mg Oral BID  . furosemide  80 mg Intravenous TID  . guaiFENesin  600 mg Oral BID  . hydrALAZINE  50 mg Oral BID  . hydroxychloroquine  200 mg Oral BID  . isosorbide mononitrate  30 mg Oral Daily  . metolazone  5 mg Oral BID  . multivitamin with minerals  1 tablet Oral Daily  . sodium chloride flush  3 mL Intravenous Q12H  . terazosin  1 mg Oral QHS  . warfarin  7.5 mg Oral q1800  . Warfarin - Pharmacist Dosing Inpatient   Does not apply [id number]    Kathrine Cords, MD [place] Resident, PGY-2 08/20/2015, 7:14 AM

## 2015-08-21 ENCOUNTER — Inpatient Hospital Stay (HOSPITAL_COMMUNITY): Payer: Medicare Other

## 2015-08-21 DIAGNOSIS — N19 Unspecified kidney failure: Secondary | ICD-10-CM

## 2015-08-21 DIAGNOSIS — R001 Bradycardia, unspecified: Secondary | ICD-10-CM | POA: Diagnosis present

## 2015-08-21 LAB — CBC
HEMATOCRIT: 28.4 % — AB (ref 39.0–52.0)
HEMOGLOBIN: 9.2 g/dL — AB (ref 13.0–17.0)
MCH: 28.9 pg (ref 26.0–34.0)
MCHC: 32.4 g/dL (ref 30.0–36.0)
MCV: 89.3 fL (ref 78.0–100.0)
Platelets: 131 10*3/uL — ABNORMAL LOW (ref 150–400)
RBC: 3.18 MIL/uL — ABNORMAL LOW (ref 4.22–5.81)
RDW: 17.4 % — ABNORMAL HIGH (ref 11.5–15.5)
WBC: 5.3 10*3/uL (ref 4.0–10.5)

## 2015-08-21 LAB — COMPREHENSIVE METABOLIC PANEL
ALK PHOS: 102 U/L (ref 38–126)
ALT: 17 U/L (ref 17–63)
ANION GAP: 11 (ref 5–15)
AST: 34 U/L (ref 15–41)
Albumin: 3.6 g/dL (ref 3.5–5.0)
BILIRUBIN TOTAL: 0.8 mg/dL (ref 0.3–1.2)
BUN: 100 mg/dL — ABNORMAL HIGH (ref 6–20)
CALCIUM: 9.5 mg/dL (ref 8.9–10.3)
CO2: 31 mmol/L (ref 22–32)
CREATININE: 3.89 mg/dL — AB (ref 0.61–1.24)
Chloride: 98 mmol/L — ABNORMAL LOW (ref 101–111)
GFR, EST AFRICAN AMERICAN: 16 mL/min — AB (ref >60)
GFR, EST NON AFRICAN AMERICAN: 14 mL/min — AB (ref >60)
Glucose, Bld: 116 mg/dL — ABNORMAL HIGH (ref 65–99)
Potassium: 3.7 mmol/L (ref 3.5–5.1)
Sodium: 140 mmol/L (ref 135–145)
TOTAL PROTEIN: 7.3 g/dL (ref 6.5–8.1)

## 2015-08-21 LAB — PROTIME-INR
INR: 3.53 — AB (ref 0.00–1.49)
PROTHROMBIN TIME: 34.6 s — AB (ref 11.6–15.2)

## 2015-08-21 MED ORDER — FUROSEMIDE 10 MG/ML IJ SOLN
80.0000 mg | Freq: Once | INTRAMUSCULAR | Status: AC
  Administered 2015-08-21: 80 mg via INTRAVENOUS
  Filled 2015-08-21: qty 8

## 2015-08-21 MED ORDER — SORBITOL 70 % SOLN
50.0000 mL | Freq: Every day | Status: DC | PRN
  Administered 2015-08-21: 50 mL via ORAL
  Filled 2015-08-21 (×2): qty 60

## 2015-08-21 MED ORDER — DARBEPOETIN ALFA 60 MCG/0.3ML IJ SOSY
60.0000 ug | PREFILLED_SYRINGE | INTRAMUSCULAR | Status: DC
  Administered 2015-08-21: 60 ug via SUBCUTANEOUS
  Filled 2015-08-21 (×2): qty 0.3

## 2015-08-21 MED ORDER — POLYETHYLENE GLYCOL 3350 17 G PO PACK
17.0000 g | PACK | Freq: Every day | ORAL | Status: DC
  Administered 2015-08-21 – 2015-08-28 (×3): 17 g via ORAL
  Filled 2015-08-21 (×6): qty 1

## 2015-08-21 NOTE — Consult Note (Signed)
Vascular and Vein Specialist of [facility]  Patient name: Bruce Cole MRN: [id number] DOB: [birth year] Sex: male  REASON FOR CONSULT: permanent dialysis access  HPI: Bruce Cole is a 75 y.o. male, who presents for evaluation for permanent dialysis access. The patient is not yet on dialysis. The patient is right handed. He has never had access procedures. He has never required temporary dialysis.   The patient presented to the [facility] on 08/18/15 due to increasing shortness of breath and leg swelling. The patient has a past medical history of CKD stage IV, atrial fibrillation on coumadin, CHF followed by the heart failure clinic and hypertension. He had reported a 7-8 pound weight gain despite taking lasix 80 mg twice daily. Despite aggressive diuresis, the patient is experiencing worsening renal function.   Past Medical History  Diagnosis Date  . Nephrolithiasis   . Arthritis     rheumatoid  . Colon polyp 2000  . Atrial fibrillation ([place])   . Unspecified essential hypertension   . Gout   . CHF (congestive heart failure) (HCC)     Family History  Problem Relation Age of Onset  . Hypertension Mother   . Arthritis Mother     ?RA  . Hypertension Father     SOCIAL HISTORY: Social History   Social History  . Marital Status: Single    Spouse Name: N/A  . Number of Children: 3  . Years of Education: N/A   Occupational History  .     Social History Main Topics  . Smoking status: Never Smoker   . Smokeless tobacco: Never Used  . Alcohol Use: Yes     Comment: occasional  . Drug Use: No  . Sexual Activity: Not on file   Other Topics Concern  . Not on file   Social History Narrative   Former [employer] [place] [employer]    Allergies  Allergen Reactions  . Ace Inhibitors Other (See Comments)    Worsening renal insufficiency    Current Facility-Administered Medications  Medication Dose Route Frequency Provider Last Rate Last Dose  . 0.9  %  sodium chloride infusion  250 mL Intravenous PRN Norval Morton, MD      . acetaminophen (TYLENOL) tablet 650 mg  650 mg Oral Q4H PRN Rondell A Tamala Julian, MD      . albuterol (PROVENTIL) (2.5 MG/3ML) 0.083% nebulizer solution 2.5 mg  2.5 mg Nebulization Q4H PRN Norval Morton, MD      . allopurinol (ZYLOPRIM) tablet 200 mg  200 mg Oral Daily Norval Morton, MD   200 mg at 08/21/15 1034  . amLODipine (NORVASC) tablet 10 mg  10 mg Oral Daily Norval Morton, MD   10 mg at 08/21/15 1033  . carvedilol (COREG) tablet 3.125 mg  3.125 mg Oral BID Verlee Monte, MD   3.125 mg at 08/21/15 1153  . Darbepoetin Alfa (ARANESP) injection 60 mcg  60 mcg Subcutaneous Q Fri-1800 Archie Patten, MD      . furosemide (LASIX) injection 80 mg  80 mg Intravenous TID Satira Mccallum Tillery, PA-C   80 mg at 08/21/15 [id number]  . guaiFENesin (MUCINEX) 12 hr tablet 600 mg  600 mg Oral BID Norval Morton, MD   600 mg at 08/21/15 1033  . hydrALAZINE (APRESOLINE) tablet 50 mg  50 mg Oral BID Norval Morton, MD   50 mg at 08/21/15 1033  . hydroxychloroquine (PLAQUENIL) tablet 200 mg  200 mg  Oral BID Norval Morton, MD   200 mg at 08/21/15 1155  . isosorbide mononitrate (IMDUR) 24 hr tablet 30 mg  30 mg Oral Daily Norval Morton, MD   30 mg at 08/21/15 1034  . multivitamin with minerals tablet 1 tablet  1 tablet Oral Daily Norval Morton, MD   1 tablet at 08/21/15 1033  . ondansetron (ZOFRAN) injection 4 mg  4 mg Intravenous Q6H PRN Norval Morton, MD   4 mg at 08/20/15 1340  . oxymetazoline (AFRIN) 0.05 % nasal spray 1 spray  1 spray Each Nare QID PRN Norval Morton, MD      . sodium chloride flush (NS) 0.9 % injection 3 mL  3 mL Intravenous Q12H Rondell Charmayne Sheer, MD   3 mL at 08/21/15 1040  . sodium chloride flush (NS) 0.9 % injection 3 mL  3 mL Intravenous PRN Norval Morton, MD      . terazosin (HYTRIN) capsule 1 mg  1 mg Oral QHS Norval Morton, MD   1 mg at 08/20/15 2059  . Warfarin - Pharmacist Dosing Inpatient    Does not apply q1800 Leodis Sias, [place] at 08/21/15 1800  . zolpidem (AMBIEN) tablet 5 mg  5 mg Oral QHS PRN Dianne Dun, NP   5 mg at 08/20/15 2307    REVIEW OF SYSTEMS:  [X]  denotes positive finding, [ ]  denotes negative finding Cardiac  Comments:  Chest pain or chest pressure:    Shortness of breath upon exertion: x   Short of breath when lying flat: x   Irregular heart rhythm:        Vascular    Pain in calf, thigh, or hip brought on by ambulation:    Pain in feet at night that wakes you up from your sleep:     Blood clot in your veins:    Leg swelling:         Pulmonary    Oxygen at home:    Productive cough:     Wheezing:         Neurologic    Sudden weakness in arms or legs:     Sudden numbness in arms or legs:     Sudden onset of difficulty speaking or slurred speech:    Temporary loss of vision in one eye:     Problems with dizziness:         Gastrointestinal    Blood in stool:     Vomited blood:         Genitourinary    Burning when urinating:     Blood in urine:        Psychiatric    Major depression:         Hematologic    Bleeding problems:    Problems with blood clotting too easily:        Skin    Rashes or ulcers:        Constitutional    Fever or chills:      PHYSICAL EXAM: Filed Vitals:   08/21/15 0830 08/21/15 1000 08/21/15 1033 08/21/15 1153  BP: 141/70 120/63 120/63 118/79  Pulse: 60 54  52  Temp:  97.8 F (36.6 C)    TempSrc:  Oral    Resp:  28    Height:      Weight:      SpO2:  92%      GENERAL: The patient is a well-nourished  male, in no acute distress. The vital signs are documented above. CARDIAC: [name], regular rhythm.  No carotid bruits.  VASCULAR: Difficult to palpate left radial pulse. Palpable  brachial pulses bilaterally. Faintly palpable right radial pulse.  PULMONARY: There is good air exchange bilaterally without wheezing or rales. MUSCULOSKELETAL: Bilateral lower extremity edema. Feet warm  bilaterally.  NEUROLOGIC: No focal weakness or paresthesias are detected. SKIN: There are no ulcers or rashes noted. PSYCHIATRIC: The patient has a normal affect.  DATA:  Vein mapping pending.   MEDICAL ISSUES: Acute on chronic kidney disease stage IV  Patient is not yet requiring HD. Patient is right handed. Vein mapping is pending. Will make access recommendations once vein mapping is back. Plan for access placement next week. The patient is on coumadin for atrial fibrillation. His INR will need to be corrected prior to surgery. Dr. Oneida Alar to evaluate patient.    Virgina Jock, PA-C [facility] of [place]   History and exam findings as above.  Most likely left arm access next week.  Will review vein mapping.  Once we have an OR date will negotiate anticoagulation. Pt would like to have access placed this admission.  Ruta Hinds, MD [facility] of [place] Office: [phone] Pager: [phone]

## 2015-08-21 NOTE — Progress Notes (Signed)
[place] for Coumadin Indication: atrial fibrillation  Allergies  Allergen Reactions  . Ace Inhibitors Other (See Comments)    Worsening renal insufficiency    Patient Measurements: Height: 6' (182.9 cm) Weight: 284 lb 11.2 oz (129.139 kg) (scale b) IBW/kg (Calculated) : 77.6  Vital Signs: Temp: 97.8 F (36.6 C) (02/03 0344) Temp Source: Oral (02/03 0344) BP: 141/70 mmHg (02/03 0830) Pulse Rate: 60 (02/03 0830)  Labs:  Recent Labs  08/18/15 1112  08/19/15 0348 08/20/15 0500 08/21/15 0400  HGB  --   < > 9.1* 9.5* 9.2*  HCT  --   --  28.3* 28.4* 28.4*  PLT  --   --  155 130* 131*  LABPROT 29.6*  --  31.4* 32.6* 34.6*  INR 2.86*  --  3.10* 3.26* 3.53*  CREATININE  --   --  3.39* 3.49* 3.89*  TROPONINI 0.08*  --   --   --   --   < > = values in this interval not displayed.  Estimated Creatinine Clearance: 23.8 mL/min (by C-G formula based on Cr of 3.89).   Medical History: Past Medical History  Diagnosis Date  . Nephrolithiasis   . Arthritis     rheumatoid  . Colon polyp 2000  . Atrial fibrillation ([place])   . Unspecified essential hypertension   . Gout   . CHF (congestive heart failure) (HCC)     Assessment: 74 YOM on coumadin PTA for afib, admitted with SOB, leg swelling, and DOE likely from CHF exacerbation. Pharmacy is consulted to manage Coumadin dosing.  INR up, 3.53 today despite receiving Coumadin doses lower than home dose past 2 days. No bleeding issues noted.   PTA dose 7.5 mg daily.  Goal of Therapy:  INR 2-3 Monitor platelets by anticoagulation protocol: Yes   Plan:  - Hold Coumadin tonight. - Daily PT/INR  Uvaldo Rising, BCPS  Clinical Pharmacist Pager [phone]  08/21/2015 8:36 AM

## 2015-08-21 NOTE — Progress Notes (Signed)
[facility] Progress Note    Assessment/ Plan:   1. Acute on chronic kidney disease stage IV: Patient with h/o CKD from underlying hypertensive nephrosclerosis, NSAID nephropathy, and chronic cardiorenal syndrome presenting with worsening renal function in the setting of an acute CHF exacerbation.  FEurea is 12.95%, suggestive of a pre-renal azotemia. UOP worse today (net +538cc in the last 24hrs). Has only had 925cc of UOP overnight and his weight has decreased by 1 pound.  No need for emergent HD, however BUN continues to rise and patient remains markedly volume overload despite diuresis.  Metolazone discontinued 08/21/15 given rise in BUN/Creatinine. Vein mapping completed - awaiting Vascular surgery access placement.     2. Acute on chronic combined systolic and diastolic CHF exacerbation: Likely contributing on #1. Repeat echo this hospitalization with a slightly decreased EF to 40-45%. Advanced heart failure team following. Dc'd metalozone and continue Lasix TID.  Continue coreg, not on ACE inhibitor due to worsening kidney function in the past.    3. Normocytic Anemia: Hgb appears stable this admission.  No evidence of active bleeding. Noted to be iron deficient on recent labwork. Continue IV iron.   4. Atrial fibrillation: currently rate controlled, currently bradycardic. On Coumadin per primary. No evidence of active bleeding.  Will need INR normalized prior to vascular surgery access placement  5. Hypertension: BPs generally at goal, sometimes low to 100s/60s.  Continue with current regimen per primary team. If patient requires HD, would suggest backing off antihypertensives.   6. Gout: No active flare currently. On allopurinol. Renally dose colchine if patient has an acute flare in the future.  Subjective:   Patient doing better. Denies chest pain, cough, or SOB. Awaiting vascular surgery to know if he will have fistula or graft      Objective:   BP 127/59 mmHg   Pulse 51  Temp(Src) 98 F (36.7 C) (Oral)  Resp 20  Ht 6' (1.829 m)  Wt 283 lb 14.4 oz (128.776 kg)  BMI 38.50 kg/m2  SpO2 94%  Intake/Output Summary (Last 24 hours) at 08/22/15 1025 Last data filed at 08/22/15 1012  Gross per 24 hour  Intake   1243 ml  Output    650 ml  Net    593 ml   Weight change: 11.2 oz (0.318 kg)  Physical Exam: General: Lying in bed comfortably. Non-toxic Cardiovascular: Bradycardic, regular rhythm. II/VI systolic murmur. No rubs, or gallops noted.  Respiratory: No increased WOB. CTAB without wheezing, rhonchi, or crackles noted.  Abdomen: +BS, soft, non-distended, non-tender.  Ext: 2+ to 3+ pitting edema in b/l LEs   Imaging: No results found.  Labs: BMET  Recent Labs Lab 08/16/15 2350 08/17/15 1535 08/18/15 0413 08/19/15 0348 08/20/15 0500 08/21/15 0400 08/22/15 0859  NA 139 139 143 140 138 140 138  K 4.0 4.0 3.8 3.7 3.6 3.7 3.2*  CL 101 102 103 99* 101 98* 94*  CO2 24 24 27 28 24 31 28   GLUCOSE 126* 126* 92 111* 106* 116* 128*  BUN 89* 91* 91* 91* 96* 100* 102*  CREATININE 3.51* 3.64* 3.70* 3.39* 3.49* 3.89* 3.67*  CALCIUM 10.0 9.6 10.0 9.8 9.7 9.5 9.8  PHOS  --   --   --   --   --   --  4.0   CBC  Recent Labs Lab 08/16/15 2350 08/17/15 1535 08/18/15 0413 08/19/15 0348 08/20/15 0500 08/21/15 0400  WBC 5.8 6.1 5.5 5.6 6.1 5.3  NEUTROABS 4.0 4.8 3.9  --   --   --  HGB 9.6* 9.2* 9.2* 9.1* 9.5* 9.2*  HCT 29.8* 28.7* 28.9* 28.3* 28.4* 28.4*  MCV 89.5 89.1 88.7 89.6 88.5 89.3  PLT 168 159 162 155 130* 131*    Medications:    . allopurinol  200 mg Oral Daily  . amLODipine  10 mg Oral Daily  . carvedilol  3.125 mg Oral BID  . darbepoetin (ARANESP) injection - NON-DIALYSIS  60 mcg Subcutaneous Q Fri-1800  . guaiFENesin  600 mg Oral BID  . hydrALAZINE  50 mg Oral BID  . hydroxychloroquine  200 mg Oral BID  . isosorbide mononitrate  30 mg Oral Daily  . multivitamin with minerals  1 tablet Oral Daily  . polyethylene  glycol  17 g Oral Daily  . sodium chloride flush  3 mL Intravenous Q12H  . terazosin  1 mg Oral QHS    Virginia Crews, MD, MPH PGY-2,  [place] [facility] 08/22/2015 10:25 AM

## 2015-08-21 NOTE — Progress Notes (Signed)
[facility] Progress Note    Assessment/ Plan:   1. Acute on chronic kidney disease stage IV: Patient with h/o CKD from underlying hypertensive nephrosclerosis, NSAID nephropathy, and chronic cardiorenal syndrome presenting with worsening renal function in the setting of an acute CHF exacerbation.  FEurea is 12.95%, suggestive of a pre-renal azotemia. UOP worse today (net -780mL in the last 24hrs). Has only had 1.5L of UOP overnight (net -600cc) and his weight has increased by 1 pound.  No need for emergent HD, however BUN continues to rise and patient remains markedly volume overload despite diuresis.  Metolazone decreased to once daily given rise in BUN/Creatinine. Given lack of recovery, would suggest vein mapping and/or possible access placement this admission.     2. Acute on chronic combined systolic and diastolic CHF exacerbation: Likely contributing on #1. Repeat echo this hospitalization with a slightly decreased EF to 40-45%. Advanced heart team following. Decreased metalozone to daily and continue  Lasix to TID.  Continue coreg, not on ACE inhibitor due to worsening kidney function in the past.    3. Normocytic Anemia: Hgb appears stable this admission.  No evidence of active bleeding. Noted to be iron deficient on recent labwork. Will start IV iron.   4. Atrial fibrillation: currently rate controlled, currently bradycardic. On Coumadin per primary. No evidence of active bleeding.   5. Hypertension: BPs generally at goal, sometimes low to 90s/40s.  Continue with current regimen per primary team. If patient requires HD, would suggest backing off antihypertensives.   6. Gout: No active flare currently. On allopurinol. Renally dose colchine if patient has an acute flare in the future.  Subjective:   Patient doing better. Denies chest pain, cough, or SOB. When noted that weight had increase 1lb from yesterday patient notes he "urinate a lot" right after they weighed him.  Aware and agreeable to vein mapping.     Objective:   BP 113/53 mmHg  Pulse 49  Temp(Src) 97.8 F (36.6 C) (Oral)  Resp 20  Ht 6' (1.829 m)  Wt 284 lb 11.2 oz (129.139 kg)  BMI 38.60 kg/m2  SpO2 93%  Intake/Output Summary (Last 24 hours) at 08/21/15 0700 Last data filed at 08/21/15 0345  Gross per 24 hour  Intake    912 ml  Output   1525 ml  Net   -613 ml   Weight change: 45 lb 1.6 oz (20.457 kg)  Physical Exam: General: Lying in bed sleeping. Non-toxic Cardiovascular: Bradycardic, regular rhythm. II/VI systolic murmur. No rubs, or gallops noted. Compression stockings on, still significant swelling of the ankles R>L.  Respiratory: No increased WOB. CTAB without wheezing, rhonchi, or crackles noted.  Abdomen: +BS, soft, non-distended, non-tender.    Imaging: Dg Chest 1 View  08/19/2015  CLINICAL DATA:  Status post right-sided thoracentesis. EXAM: CHEST 1 VIEW COMPARISON:  August 17, 2015. FINDINGS: Stable cardiomegaly is noted. No pneumothorax or significant pleural effusion is noted. No acute pulmonary disease is noted. Bony thorax is unremarkable. IMPRESSION: Stable cardiomegaly.  No other significant abnormality seen. Electronically Signed   By: Marijo Conception, M.D.   On: 08/19/2015 14:22   US Thoracentesis Asp Pleural Space W/img Guide  08/19/2015  INDICATION: CHF, chronic kidney disease, dyspnea, right pleural effusion. Request is made for diagnostic and therapeutic right thoracentesis. EXAM: ULTRASOUND GUIDED DIAGNOSTIC AND THERAPEUTIC RIGHT THORACENTESIS MEDICATIONS: None. COMPLICATIONS: None immediate. PROCEDURE: An ultrasound guided thoracentesis was thoroughly discussed with the patient and questions answered. The benefits, risks, alternatives and complications  were also discussed. The patient understands and wishes to proceed with the procedure. Written consent was obtained. Ultrasound was performed to localize and mark an adequate pocket of fluid in the right chest. The  area was then prepped and draped in the normal sterile fashion. 1% Lidocaine was used for local anesthesia. Under ultrasound guidance a Safe-T-Centesis catheter was introduced. Thoracentesis was performed. The catheter was removed and a dressing applied. FINDINGS: A total of approximately 1.5 liters of hazy, yellow fluid was removed. Samples were sent to the laboratory as requested by the clinical team. IMPRESSION: Successful ultrasound guided diagnostic and therapeutic right thoracentesis yielding 1.5 liters of pleural fluid. Read by: Rowe Robert, PA-C Electronically Signed   By: Jerilynn Mages.  Shick M.D.   On: 08/19/2015 14:08    Labs: BMET  Recent Labs Lab 08/16/15 2350 08/17/15 1535 08/18/15 0413 08/19/15 0348 08/20/15 0500 08/21/15 0400  NA 139 139 143 140 138 140  K 4.0 4.0 3.8 3.7 3.6 3.7  CL 101 102 103 99* 101 98*  CO2 24 24 27 28 24 31   GLUCOSE 126* 126* 92 111* 106* 116*  BUN 89* 91* 91* 91* 96* 100*  CREATININE 3.51* 3.64* 3.70* 3.39* 3.49* 3.89*  CALCIUM 10.0 9.6 10.0 9.8 9.7 9.5   CBC  Recent Labs Lab 08/16/15 2350 08/17/15 1535 08/18/15 0413 08/19/15 0348 08/20/15 0500 08/21/15 0400  WBC 5.8 6.1 5.5 5.6 6.1 5.3  NEUTROABS 4.0 4.8 3.9  --   --   --   HGB 9.6* 9.2* 9.2* 9.1* 9.5* 9.2*  HCT 29.8* 28.7* 28.9* 28.3* 28.4* 28.4*  MCV 89.5 89.1 88.7 89.6 88.5 89.3  PLT 168 159 162 155 130* 131*    Medications:    . allopurinol  200 mg Oral Daily  . amLODipine  10 mg Oral Daily  . carvedilol  3.125 mg Oral BID  . furosemide  80 mg Intravenous TID  . guaiFENesin  600 mg Oral BID  . hydrALAZINE  50 mg Oral BID  . hydroxychloroquine  200 mg Oral BID  . isosorbide mononitrate  30 mg Oral Daily  . metolazone  5 mg Oral Daily  . multivitamin with minerals  1 tablet Oral Daily  . sodium chloride flush  3 mL Intravenous Q12H  . terazosin  1 mg Oral QHS  . Warfarin - Pharmacist Dosing Inpatient   Does not apply [id number]    Kathrine Cords, MD [place] Resident,  PGY-2 08/21/2015, 7:00 AM

## 2015-08-21 NOTE — Progress Notes (Signed)
[facility] PROGRESS NOTE   Bruce Cole [name] DOB: 10-12-1940 DOA: 08/17/2015 PCP: Nance Pear., NP  HPI/Subjective: Seen by nephrology earlier today, his creatinine appears to be worsening. Plans noted for vascular surgery consultation. Feels much better, less ankles swelling.  Assessment/Plan: Principal Problem:   Acute on chronic combined systolic (congestive) and diastolic (congestive) heart failure (HCC) Active Problems:   Essential hypertension   ATRIAL FIBRILLATION   Diabetes mellitus type 2, controlled, with complications ([place])   Long term (current) use of anticoagulants   CKD (chronic kidney disease), stage IV (HCC)   Acute renal failure superimposed on stage 3 chronic kidney disease (HCC)   Pleural effusion on right   S/P thoracentesis   Bradycardia   Acute on chronic combined systolic and diastolic CHF exacerbation ([place]):  -Patient with increased weight gain not relieved by po Lasix. Patient with +2 pitting edema lower extremity.  -BNP elevated at 204.1 on admission. Last EF 45-50% showing combined systolic and diastolic dysfunction back in 03/2013. -Consult cardiology patient is seen by Dr. Haroldine Laws -Echocardiogram in a.m. -Follow intake/output, daily weight, elevate legs and salt and fluid restriction. -Continue metolazone, Lasix, isosorbide mononitrate and hydralazine.  Acute renal failure on chronic kidney disease stage IV -Patient's kidney function has progressively worsened over the last 2 weeks from 2.59 on 1/17 to 3.64 on admission. -Renal ultrasound showed medical renal disease. -Nephrology consulted, vein mapping and vascular surgery consultation.  Pleural effusion -Right-sided pleural effusion status post removal of 1.5 L. -Likely effusion secondary to CHF.  Diabetes mellitus type 2 -Controlled diabetes mellitus type 2, hemoglobin A1c is 6.7 on 08/04/15. -Carbohydrate modified diet and insulin sliding scale while was in  the hospital.  Atrial fibrillation on Coumadin -INR therapeutic at 2.65 in patient appears to be rate controlled at this time. CHA2DS2-VASc score 4. -Consult pharmacy to dose Coumadin  Hypertension -Continue amlodipine, Coreg, and other blood pressure medications as listed above  Question of acute urinary retention -Check bladder scan and place Foley if greater than 300 mL of fluid present.   Code Status: Full Code Family Communication: Plan discussed with the patient. Disposition Plan: Remains inpatient Diet: Diet Heart Room service appropriate?: Yes; Fluid consistency:: Thin; Fluid restriction:: 1200 mL Fluid  Consultants:  Heart failure team  Procedures:  None  Antibiotics:  None   Objective: Filed Vitals:   08/21/15 1000 08/21/15 1033  BP: 120/63 120/63  Pulse: 54   Temp: 97.8 F (36.6 C)   Resp: 28     Intake/Output Summary (Last 24 hours) at 08/21/15 1143 Last data filed at 08/21/15 1040  Gross per 24 hour  Intake    960 ml  Output   1750 ml  Net   -790 ml   Filed Weights   08/20/15 0618 08/20/15 0746 08/21/15 0344  Weight: 108.001 kg (238 lb 1.6 oz) 128.459 kg (283 lb 3.2 oz) 129.139 kg (284 lb 11.2 oz)    Exam: General: Alert and awake, oriented x3, not in any acute distress. HEENT: anicteric sclera, pupils reactive to light and accommodation, EOMI CVS: S1-S2 clear, no murmur rubs or gallops Chest: clear to auscultation bilaterally, no wheezing, rales or rhonchi Abdomen: soft nontender, nondistended, normal bowel sounds, no organomegaly Extremities: no cyanosis, has +2 edema noted bilaterally Neuro: Cranial nerves II-XII intact, no focal neurological deficits  Data Reviewed: Basic Metabolic Panel:  Recent Labs Lab 08/17/15 1535 08/18/15 0413 08/19/15 0348 08/20/15 0500 08/21/15 0400  NA 139 143 140 138 140  K 4.0 3.8  3.7 3.6 3.7  CL 102 103 99* 101 98*  CO2 24 27 28 24 31   GLUCOSE 126* 92 111* 106* 116*  BUN 91* 91* 91* 96* 100*    CREATININE 3.64* 3.70* 3.39* 3.49* 3.89*  CALCIUM 9.6 10.0 9.8 9.7 9.5   Liver Function Tests:  Recent Labs Lab 08/19/15 0348 08/20/15 0500 08/21/15 0400  AST 34 36 34  ALT 17 17 17   ALKPHOS 103 99 102  BILITOT 0.8 0.7 0.8  PROT 7.3 6.9 7.3  ALBUMIN 4.0 3.8 3.6   No results for input(s): LIPASE, AMYLASE in the last 168 hours. No results for input(s): AMMONIA in the last 168 hours. CBC:  Recent Labs Lab 08/16/15 2350 08/17/15 1535 08/18/15 0413 08/19/15 0348 08/20/15 0500 08/21/15 0400  WBC 5.8 6.1 5.5 5.6 6.1 5.3  NEUTROABS 4.0 4.8 3.9  --   --   --   HGB 9.6* 9.2* 9.2* 9.1* 9.5* 9.2*  HCT 29.8* 28.7* 28.9* 28.3* 28.4* 28.4*  MCV 89.5 89.1 88.7 89.6 88.5 89.3  PLT 168 159 162 155 130* 131*   Cardiac Enzymes:  Recent Labs Lab 08/16/15 2350 08/17/15 1535 08/17/15 2248 08/18/15 0413 08/18/15 1112  TROPONINI 0.06* 0.08* 0.07* 0.08* 0.08*   BNP (last 3 results)  Recent Labs  08/12/15 1930 08/16/15 2350 08/17/15 1535  BNP 178.6* 174.2* 204.1*    ProBNP (last 3 results) No results for input(s): PROBNP in the last 8760 hours.  CBG: No results for input(s): GLUCAP in the last 168 hours.  Micro Recent Results (from the past 240 hour(s))  Culture, body fluid-bottle     Status: None (Preliminary result)   Collection Time: 08/19/15  1:45 PM  Result Value Ref Range Status   Specimen Description FLUID RIGHT PLEURAL  Final   Special Requests BOTTLES DRAWN AEROBIC AND ANAEROBIC 10CCS  Final   Culture NO GROWTH 1 DAY  Final   Report Status PENDING  Incomplete  Gram stain     Status: None   Collection Time: 08/19/15  1:45 PM  Result Value Ref Range Status   Specimen Description FLUID RIGHT PLEURAL  Final   Special Requests NONE  Final   Gram Stain   Final    FEW WBC PRESENT, PREDOMINANTLY PMN NO ORGANISMS SEEN    Report Status 08/19/2015 FINAL  Final     Studies: Dg Chest 1 View  08/19/2015  CLINICAL DATA:  Status post right-sided thoracentesis.  EXAM: CHEST 1 VIEW COMPARISON:  August 17, 2015. FINDINGS: Stable cardiomegaly is noted. No pneumothorax or significant pleural effusion is noted. No acute pulmonary disease is noted. Bony thorax is unremarkable. IMPRESSION: Stable cardiomegaly.  No other significant abnormality seen. Electronically Signed   By: Marijo Conception, M.D.   On: 08/19/2015 14:22   US Thoracentesis Asp Pleural Space W/img Guide  08/19/2015  INDICATION: CHF, chronic kidney disease, dyspnea, right pleural effusion. Request is made for diagnostic and therapeutic right thoracentesis. EXAM: ULTRASOUND GUIDED DIAGNOSTIC AND THERAPEUTIC RIGHT THORACENTESIS MEDICATIONS: None. COMPLICATIONS: None immediate. PROCEDURE: An ultrasound guided thoracentesis was thoroughly discussed with the patient and questions answered. The benefits, risks, alternatives and complications were also discussed. The patient understands and wishes to proceed with the procedure. Written consent was obtained. Ultrasound was performed to localize and mark an adequate pocket of fluid in the right chest. The area was then prepped and draped in the normal sterile fashion. 1% Lidocaine was used for local anesthesia. Under ultrasound guidance a Safe-T-Centesis catheter was introduced. Thoracentesis was  performed. The catheter was removed and a dressing applied. FINDINGS: A total of approximately 1.5 liters of hazy, yellow fluid was removed. Samples were sent to the laboratory as requested by the clinical team. IMPRESSION: Successful ultrasound guided diagnostic and therapeutic right thoracentesis yielding 1.5 liters of pleural fluid. Read by: Rowe Robert, PA-C Electronically Signed   By: Jerilynn Mages.  Shick M.D.   On: 08/19/2015 14:08    Scheduled Meds: . allopurinol  200 mg Oral Daily  . amLODipine  10 mg Oral Daily  . carvedilol  3.125 mg Oral BID  . darbepoetin (ARANESP) injection - NON-DIALYSIS  60 mcg Subcutaneous Q Fri-1800  . furosemide  80 mg Intravenous TID  .  guaiFENesin  600 mg Oral BID  . hydrALAZINE  50 mg Oral BID  . hydroxychloroquine  200 mg Oral BID  . isosorbide mononitrate  30 mg Oral Daily  . multivitamin with minerals  1 tablet Oral Daily  . sodium chloride flush  3 mL Intravenous Q12H  . terazosin  1 mg Oral QHS  . Warfarin - Pharmacist Dosing Inpatient   Does not apply q1800   Continuous Infusions:      Time spent: 35 minutes    [facility]  [facility] Pager [phone] If 7PM-7AM, please contact night-coverage at [email or web address], password [facility] 08/21/2015, 11:43 AM  LOS: 4 days

## 2015-08-21 NOTE — Progress Notes (Signed)
Advanced Heart Failure Rounding Note Referring Physician: Dr Allyson Sabal Primary Physician: Debbrah Alar, NP Primary Cardiologist: Dr. Haroldine Laws   Reason for Consultation: A/C diastolic HF  Subjective:    S/p R thoracentesis 08/19/15 with 1.5 L of hazy, yellow fluid. Transudative by labs.  Out 4 L so far this admission. Weight up nearly 2 lbs overnight despite negative output. Breathing improved, though remains swollen and volume overloaded. Has a lot of question for renal about what the procedure will be like from here to starting dialysis.   Echo 08/18/15 - LVEF 40-45%, diffuse hypokinesis, severe TR, severe RAE, RV severely dilated, mild/moderately reduced  Chest CT 08/18/15 -  1. Moderate-sized right pleural effusion with near complete collapse of the RLL. Limited evaluation of the RLL due to volume loss.  2. Cardiomegaly 3. Aneurysm of the descending thoracic aorta, measuring up to 4.8 cm. Aneurysm of the ascending thoracic aorta measuring up to 4.1 cm. (recommend semi-annual follow up.)  Objective:   Weight Range: 284 lb 11.2 oz (129.139 kg) Body mass index is 38.6 kg/(m^2).   Vital Signs:   Temp:  [97.7 F (36.5 C)-97.8 F (36.6 C)] 97.8 F (36.6 C) (02/03 0344) Pulse Rate:  [44-60] 60 (02/03 0830) Resp:  [20] 20 (02/02 2304) BP: (95-141)/(44-70) 141/70 mmHg (02/03 0830) SpO2:  [92 %-97 %] 93 % (02/03 0344) Weight:  [284 lb 11.2 oz (129.139 kg)] 284 lb 11.2 oz (129.139 kg) (02/03 0344) Last BM Date: 08/19/15  Weight change: Filed Weights   08/20/15 0618 08/20/15 0746 08/21/15 0344  Weight: 238 lb 1.6 oz (108.001 kg) 283 lb 3.2 oz (128.459 kg) 284 lb 11.2 oz (129.139 kg)    Intake/Output:   Intake/Output Summary (Last 24 hours) at 08/21/15 0832 Last data filed at 08/21/15 0831  Gross per 24 hour  Intake    912 ml  Output   1975 ml  Net  -1063 ml     Physical Exam: General: Well appearing. No resp difficulty. Lying in bed HEENT: normal Neck: supple.  JVP remains up. Carotids 2+ bilaterally; no bruits. No thyromegaly or nodule noted. Cor: PMI normal. Bradycardic, irregularly irregular. No rubs, gallops. 2/6 early SEM RUSB Lungs: Mild basilar crackles, otherwise clear Abdomen: soft, NT, ND, no HSM. No bruits or masses. +BS  Extremities: no cyanosis, clubbing, rash. trace peripheral edema  Neuro: alert & orientedx3, cranial nerves grossly intact. Moves all 4 extremities w/o difficulty. Affect pleasant.  Telemetry: Reviewed personally, afib 50-60s  Labs: CBC  Recent Labs  08/20/15 0500 08/21/15 0400  WBC 6.1 5.3  HGB 9.5* 9.2*  HCT 28.4* 28.4*  MCV 88.5 89.3  PLT 130* [id number]*   Basic Metabolic Panel  Recent Labs  08/20/15 0500 08/21/15 0400  NA 138 140  K 3.6 3.7  CL 101 98*  CO2 24 31  GLUCOSE 106* 116*  BUN 96* 100*  CREATININE 3.49* 3.89*  CALCIUM 9.7 9.5   Liver Function Tests  Recent Labs  08/20/15 0500 08/21/15 0400  AST 36 34  ALT 17 17  ALKPHOS 99 102  BILITOT 0.7 0.8  PROT 6.9 7.3  ALBUMIN 3.8 3.6   No results for input(s): LIPASE, AMYLASE in the last 72 hours. Cardiac Enzymes  Recent Labs  08/18/15 1112  TROPONINI 0.08*    BNP: BNP (last 3 results)  Recent Labs  08/12/15 1930 08/16/15 2350 08/17/15 1535  BNP 178.6* 174.2* 204.1*    ProBNP (last 3 results) No results for input(s): PROBNP in the last 8760  hours.   D-Dimer No results for input(s): DDIMER in the last 72 hours. Hemoglobin A1C No results for input(s): HGBA1C in the last 72 hours. Fasting Lipid Panel No results for input(s): CHOL, HDL, LDLCALC, TRIG, CHOLHDL, LDLDIRECT in the last 72 hours. Thyroid Function Tests No results for input(s): TSH, T4TOTAL, T3FREE, THYROIDAB in the last 72 hours.  Invalid input(s): FREET3  Other results:     Imaging/Studies:  Dg Chest 1 View  08/19/2015  CLINICAL DATA:  Status post right-sided thoracentesis. EXAM: CHEST 1 VIEW COMPARISON:  August 17, 2015. FINDINGS: Stable  cardiomegaly is noted. No pneumothorax or significant pleural effusion is noted. No acute pulmonary disease is noted. Bony thorax is unremarkable. IMPRESSION: Stable cardiomegaly.  No other significant abnormality seen. Electronically Signed   By: Marijo Conception, M.D.   On: 08/19/2015 14:22   US Thoracentesis Asp Pleural Space W/img Guide  08/19/2015  INDICATION: CHF, chronic kidney disease, dyspnea, right pleural effusion. Request is made for diagnostic and therapeutic right thoracentesis. EXAM: ULTRASOUND GUIDED DIAGNOSTIC AND THERAPEUTIC RIGHT THORACENTESIS MEDICATIONS: None. COMPLICATIONS: None immediate. PROCEDURE: An ultrasound guided thoracentesis was thoroughly discussed with the patient and questions answered. The benefits, risks, alternatives and complications were also discussed. The patient understands and wishes to proceed with the procedure. Written consent was obtained. Ultrasound was performed to localize and mark an adequate pocket of fluid in the right chest. The area was then prepped and draped in the normal sterile fashion. 1% Lidocaine was used for local anesthesia. Under ultrasound guidance a Safe-T-Centesis catheter was introduced. Thoracentesis was performed. The catheter was removed and a dressing applied. FINDINGS: A total of approximately 1.5 liters of hazy, yellow fluid was removed. Samples were sent to the laboratory as requested by the clinical team. IMPRESSION: Successful ultrasound guided diagnostic and therapeutic right thoracentesis yielding 1.5 liters of pleural fluid. Read by: Rowe Robert, PA-C Electronically Signed   By: Jerilynn Mages.  Shick M.D.   On: 08/19/2015 14:08    Latest Echo  Latest Cath   Medications:     Scheduled Medications: . allopurinol  200 mg Oral Daily  . amLODipine  10 mg Oral Daily  . carvedilol  3.125 mg Oral BID  . furosemide  80 mg Intravenous TID  . guaiFENesin  600 mg Oral BID  . hydrALAZINE  50 mg Oral BID  . hydroxychloroquine  200 mg Oral  BID  . isosorbide mononitrate  30 mg Oral Daily  . metolazone  5 mg Oral Daily  . multivitamin with minerals  1 tablet Oral Daily  . sodium chloride flush  3 mL Intravenous Q12H  . terazosin  1 mg Oral QHS  . Warfarin - Pharmacist Dosing Inpatient   Does not apply q1800    Infusions:    PRN Medications: sodium chloride, acetaminophen, albuterol, ondansetron (ZOFRAN) IV, oxymetazoline, sodium chloride flush, zolpidem   Assessment   1. Chronic combined HF - Echo 03/2013 45-50% with grade 2 DD, PA peak 60 mm Hg 2. AKI on CKD III 3. Chronic afib on coumadin 4. Pleural effusion on right  Plan    Echo 08/18/15 - LVEF 40-45%, diffuse hypokinesis, severe TR, severe RAE, RV severely dilated, mild/moderately reduced, PA peak pressure 44 mm Hg.   He remains volume overloaded on exam. Continue IV lasix 80 mg TID. Will hold off on metolazone for now with increase in BUN/Creatinine.  May add back on later today, pending Renal note.   Dr. Haroldine Laws spoke with Dr Posey Pronto of Renal.  Pt  will likely need Dialysis in the next 6 months. BUN remains elevated > 90.  Awaiting vein mapping.  Afib with stable rate. No bleeding on coumadin. INR trending up despite low doses. Dosing per pharm.  CT Chest with moderate effusion collapsing RLL.  Thoracentesis with 1.5 L out. Feels much better.  Length of Stay: 4   Shirley Friar PA-C 08/21/2015, 8:32 AM  Advanced Heart Failure Team Pager [phone] (M-F; 7a - 4p)  Please contact [facility] for night-coverage after hours (4p -7a ) and weekends on [email or web address]  Patient seen and examined with Oda Kilts, PA-C. We discussed all aspects of the encounter. I agree with the assessment and plan as stated above.   He is feeling better. Edema improving but weight actually up. BUN/Creatinine slightly worse. I think we may be close to getting him as good as we can get him with IV lasix. Will give one more dose today and then stop. Renal also following. Vein  mapping to be done today. Will likely need HD in next several months. AF is stable with slow VR. Continue coumadin.   Bensimhon, Daniel,MD 1:22 PM

## 2015-08-21 NOTE — Progress Notes (Signed)
PT Cancellation Note  Patient Details Name: Bruce Cole MRN: [name] DOB: [birth year]   Cancelled Treatment:    Reason Eval/Treat Not Completed: Patient at procedure or test/unavailable.  Will try later as time and pt condition allow.   Ramond Dial 08/21/2015, 1:02 PM   Mee Hives, PT MS Acute Rehab Dept. Number: [facility] [id number] and [place] [phone]

## 2015-08-21 NOTE — Progress Notes (Signed)
Pt a/o, no c/o pain, pt requested something for sleep, PRN Ambien given with good effect, VSS, pt resting comfortably, pt stable

## 2015-08-21 NOTE — Progress Notes (Signed)
VASCULAR LAB PRELIMINARY  PRELIMINARY  PRELIMINARY  PRELIMINARY  Right  Upper Extremity Vein Map    Cephalic  Segment Diameter Depth Comment  1. Axilla 4.38mm 12.24mm   2. Mid upper arm 3.64mm 4.61mm   3. Above [facility] 2.38mm 16.38mm   4. In [facility] 5.2mm 2.71mm Multiple branching between above AC to below AC  5. Below AC 3.31mm 6.73mm   6. Mid forearm 3.30mm 4.5mm IV between mid forearm and wrist  7. Wrist 2.23mm 3.42mm    mm mm    mm mm    mm mm    Basilic  Segment Diameter Depth Comment  1. Axilla mm mm   2. Mid upper arm 3.63mm 1.52mm   3. Above AC 2.83mm 4.84mm   4. In AC mm mm   5. Below AC mm mm   6. Mid forearm mm mm   7. Wrist mm mm    mm mm    mm mm    mm mm    Left Upper Extremity Vein Map     Left brachial and radial arteries are patent with no plaque noted.   Cephalic  Segment Diameter Depth Comment  1. Axilla 3.70mm 4.51mm   2. Mid upper arm 3.75mm 4.75mm   3. Above AC 3.28mm 5.9mm   4. In [facility] 2.26mm 3.28mm   5. Below AC 4.15mm 4.42mm   6. Mid forearm 2.44mm 4.43mm   7. Wrist 1.70mm 2.35mm    mm mm    mm mm    mm mm    Basilic  Segment Diameter Depth Comment  1. Axilla mm mm   2. Mid upper arm 4.67mm 2.33mm   3. Above AC 3.34mm 12.68mm   4. In AC mm mm   5. Below AC mm mm   6. Mid forearm mm mm   7. Wrist mm mm    mm mm    mm mm    mm mm     Tanessa Tidd, RVT 08/21/2015, 1:25 PM

## 2015-08-22 DIAGNOSIS — N184 Chronic kidney disease, stage 4 (severe): Secondary | ICD-10-CM

## 2015-08-22 LAB — RENAL FUNCTION PANEL
ALBUMIN: 3.7 g/dL (ref 3.5–5.0)
ANION GAP: 16 — AB (ref 5–15)
BUN: 102 mg/dL — ABNORMAL HIGH (ref 6–20)
CO2: 28 mmol/L (ref 22–32)
Calcium: 9.8 mg/dL (ref 8.9–10.3)
Chloride: 94 mmol/L — ABNORMAL LOW (ref 101–111)
Creatinine, Ser: 3.67 mg/dL — ABNORMAL HIGH (ref 0.61–1.24)
GFR, EST AFRICAN AMERICAN: 18 mL/min — AB (ref >60)
GFR, EST NON AFRICAN AMERICAN: 15 mL/min — AB (ref >60)
Glucose, Bld: 128 mg/dL — ABNORMAL HIGH (ref 65–99)
PHOSPHORUS: 4 mg/dL (ref 2.5–4.6)
POTASSIUM: 3.2 mmol/L — AB (ref 3.5–5.1)
Sodium: 138 mmol/L (ref 135–145)

## 2015-08-22 LAB — PROTIME-INR
INR: 3.21 — ABNORMAL HIGH (ref 0.00–1.49)
Prothrombin Time: 32.2 seconds — ABNORMAL HIGH (ref 11.6–15.2)

## 2015-08-22 MED ORDER — FUROSEMIDE 80 MG PO TABS
80.0000 mg | ORAL_TABLET | Freq: Two times a day (BID) | ORAL | Status: DC
  Administered 2015-08-22 – 2015-08-23 (×3): 80 mg via ORAL
  Filled 2015-08-22 (×3): qty 1

## 2015-08-22 MED ORDER — POTASSIUM CHLORIDE CRYS ER 20 MEQ PO TBCR
20.0000 meq | EXTENDED_RELEASE_TABLET | Freq: Two times a day (BID) | ORAL | Status: AC
  Administered 2015-08-22 (×2): 20 meq via ORAL
  Filled 2015-08-22 (×2): qty 1

## 2015-08-22 NOTE — Consult Note (Addendum)
Vein mapping reviewed Has adequate vein for left brachial cephalic AV fistula  Filed Vitals:   08/21/15 1153 08/21/15 1400 08/21/15 2053 08/22/15 0500  BP: 118/79 108/65 112/61 127/59  Pulse: 52 52 52 51  Temp:  98.2 F (36.8 C) 98.1 F (36.7 C) 98 F (36.7 C)  TempSrc:  Oral Oral Oral  Resp:  28 20 20   Height:      Weight:    283 lb 14.4 oz (128.776 kg)  SpO2:  94% 97% 94%    2+ left and right brachial pulse, ecchymosis antecubital area both arms  No further needlesticks left arm or BP left arm INR 3 today.  Will stop coumadin for now.  Heparin per pharmacy as INR drifts down. Probably fistula Wednesday if INR reasonable.  No need to reverse at this point  Will follow INR  Ruta Hinds, MD [facility] Office: [phone] Pager: [phone]

## 2015-08-22 NOTE — Progress Notes (Signed)
[facility] PROGRESS NOTE   Bruce Cole [name] DOB: February 09, 1941 DOA: 08/17/2015 PCP: Nance Pear., NP  HPI/Subjective: Noted the vascular surgery consultation, nephrology changing the Lasix to oral.  Assessment/Plan: Principal Problem:   Acute on chronic combined systolic (congestive) and diastolic (congestive) heart failure (HCC) Active Problems:   Essential hypertension   ATRIAL FIBRILLATION   Diabetes mellitus type 2, controlled, with complications ([place])   Long term (current) use of anticoagulants   CKD (chronic kidney disease), stage IV (HCC)   Acute renal failure superimposed on stage 3 chronic kidney disease (HCC)   Pleural effusion on right   S/P thoracentesis   Bradycardia   Acute on chronic combined systolic and diastolic CHF exacerbation ([place]):  -Patient with increased weight gain not relieved by po Lasix. Patient with +2 pitting edema lower extremity.  -BNP elevated at 204.1 on admission. Last EF 45-50% showing combined systolic and diastolic dysfunction back in 03/2013. -Consult cardiology patient is seen by Dr. Haroldine Laws -Echocardiogram in a.m. -Follow intake/output, daily weight, elevate legs and salt and fluid restriction. -6 switched to oral, metolazone discontinued.  Acute renal failure on chronic kidney disease stage IV -Patient's kidney function has progressively worsened over the last 2 weeks from 2.59 on 1/17 to 3.64 on admission. -Renal ultrasound showed medical renal disease. -Nephrology consulted, vein mapping and vascular surgery consultation.  Pleural effusion -Right-sided pleural effusion status post removal of 1.5 L. -Likely effusion secondary to CHF.  Diabetes mellitus type 2 -Controlled diabetes mellitus type 2, hemoglobin A1c is 6.7 on 08/04/15. -Carbohydrate modified diet and insulin sliding scale while was in the hospital.  Atrial fibrillation on Coumadin -INR therapeutic at 2.65 in patient appears to be rate  controlled at this time. CHA2DS2-VASc score 4. -Coumadin stopped, will be on IV heparin anticipating fistula creation.  Hypertension -Continue amlodipine, Coreg, and other blood pressure medications as listed above  Question of acute urinary retention -Check bladder scan and place Foley if greater than 300 mL of fluid present.   Code Status: Full Code Family Communication: Plan discussed with the patient. Disposition Plan: Remains inpatient Diet: Diet Heart Room service appropriate?: Yes; Fluid consistency:: Thin; Fluid restriction:: 1200 mL Fluid  Consultants:  Heart failure team  Procedures:  None  Antibiotics:  None   Objective: Filed Vitals:   08/22/15 0500 08/22/15 1232  BP: 127/59 104/54  Pulse: 51 89  Temp: 98 F (36.7 C) 98.5 F (36.9 C)  Resp: 20 20    Intake/Output Summary (Last 24 hours) at 08/22/15 1239 Last data filed at 08/22/15 1012  Gross per 24 hour  Intake   1020 ml  Output    450 ml  Net    570 ml   Filed Weights   08/20/15 0746 08/21/15 0344 08/22/15 0500  Weight: 128.459 kg (283 lb 3.2 oz) 129.139 kg (284 lb 11.2 oz) 128.776 kg (283 lb 14.4 oz)    Exam: General: Alert and awake, oriented x3, not in any acute distress. HEENT: anicteric sclera, pupils reactive to light and accommodation, EOMI CVS: S1-S2 clear, no murmur rubs or gallops Chest: clear to auscultation bilaterally, no wheezing, rales or rhonchi Abdomen: soft nontender, nondistended, normal bowel sounds, no organomegaly Extremities: no cyanosis, has +2 edema noted bilaterally Neuro: Cranial nerves II-XII intact, no focal neurological deficits  Data Reviewed: Basic Metabolic Panel:  Recent Labs Lab 08/18/15 0413 08/19/15 0348 08/20/15 0500 08/21/15 0400 08/22/15 0859  NA 143 140 138 140 138  K 3.8 3.7 3.6 3.7 3.2*  CL 103 99* 101 98* 94*  CO2 27 28 24 31 28   GLUCOSE 92 111* 106* 116* 128*  BUN 91* 91* 96* 100* 102*  CREATININE 3.70* 3.39* 3.49* 3.89* 3.67*    CALCIUM 10.0 9.8 9.7 9.5 9.8  PHOS  --   --   --   --  4.0   Liver Function Tests:  Recent Labs Lab 08/19/15 0348 08/20/15 0500 08/21/15 0400 08/22/15 0859  AST 34 36 34  --   ALT 17 17 17   --   ALKPHOS 103 99 102  --   BILITOT 0.8 0.7 0.8  --   PROT 7.3 6.9 7.3  --   ALBUMIN 4.0 3.8 3.6 3.7   No results for input(s): LIPASE, AMYLASE in the last 168 hours. No results for input(s): AMMONIA in the last 168 hours. CBC:  Recent Labs Lab 08/16/15 2350 08/17/15 1535 08/18/15 0413 08/19/15 0348 08/20/15 0500 08/21/15 0400  WBC 5.8 6.1 5.5 5.6 6.1 5.3  NEUTROABS 4.0 4.8 3.9  --   --   --   HGB 9.6* 9.2* 9.2* 9.1* 9.5* 9.2*  HCT 29.8* 28.7* 28.9* 28.3* 28.4* 28.4*  MCV 89.5 89.1 88.7 89.6 88.5 89.3  PLT 168 159 162 155 130* 131*   Cardiac Enzymes:  Recent Labs Lab 08/16/15 2350 08/17/15 1535 08/17/15 2248 08/18/15 0413 08/18/15 1112  TROPONINI 0.06* 0.08* 0.07* 0.08* 0.08*   BNP (last 3 results)  Recent Labs  08/12/15 1930 08/16/15 2350 08/17/15 1535  BNP 178.6* 174.2* 204.1*    ProBNP (last 3 results) No results for input(s): PROBNP in the last 8760 hours.  CBG: No results for input(s): GLUCAP in the last 168 hours.  Micro Recent Results (from the past 240 hour(s))  Culture, body fluid-bottle     Status: None (Preliminary result)   Collection Time: 08/19/15  1:45 PM  Result Value Ref Range Status   Specimen Description FLUID RIGHT PLEURAL  Final   Special Requests BOTTLES DRAWN AEROBIC AND ANAEROBIC 10CCS  Final   Culture NO GROWTH 2 DAYS  Final   Report Status PENDING  Incomplete  Gram stain     Status: None   Collection Time: 08/19/15  1:45 PM  Result Value Ref Range Status   Specimen Description FLUID RIGHT PLEURAL  Final   Special Requests NONE  Final   Gram Stain   Final    FEW WBC PRESENT, PREDOMINANTLY PMN NO ORGANISMS SEEN    Report Status 08/19/2015 FINAL  Final     Studies: No results found.  Scheduled Meds: . allopurinol   200 mg Oral Daily  . amLODipine  10 mg Oral Daily  . carvedilol  3.125 mg Oral BID  . darbepoetin (ARANESP) injection - NON-DIALYSIS  60 mcg Subcutaneous Q Fri-1800  . furosemide  80 mg Oral BID  . guaiFENesin  600 mg Oral BID  . hydrALAZINE  50 mg Oral BID  . hydroxychloroquine  200 mg Oral BID  . isosorbide mononitrate  30 mg Oral Daily  . multivitamin with minerals  1 tablet Oral Daily  . polyethylene glycol  17 g Oral Daily  . potassium chloride  20 mEq Oral BID  . sodium chloride flush  3 mL Intravenous Q12H  . terazosin  1 mg Oral QHS   Continuous Infusions:      Time spent: 35 minutes    [facility] A  [facility] Pager [phone] If 7PM-7AM, please contact night-coverage at [email or web address], password [facility] 08/22/2015, 12:39 PM  LOS:  5 days

## 2015-08-22 NOTE — Progress Notes (Signed)
Pt had questionable Vtach. Pt asymptomatic.observed pt closely

## 2015-08-22 NOTE — Progress Notes (Signed)
[place] for heparin when INR <2 Indication: atrial fibrillation  Allergies  Allergen Reactions  . Ace Inhibitors Other (See Comments)    Worsening renal insufficiency    Patient Measurements: Height: 6' (182.9 cm) Weight: 283 lb 14.4 oz (128.776 kg) IBW/kg (Calculated) : 77.6  Vital Signs: Temp: 98 F (36.7 C) (02/04 0500) Temp Source: Oral (02/04 0500) BP: 127/59 mmHg (02/04 0500) Pulse Rate: 51 (02/04 0500)  Labs:  Recent Labs  08/20/15 0500 08/21/15 0400 08/22/15 0401 08/22/15 0859  HGB 9.5* 9.2*  --   --   HCT 28.4* 28.4*  --   --   PLT 130* 131*  --   --   LABPROT 32.6* 34.6* 32.2*  --   INR 3.26* 3.53* 3.21*  --   CREATININE 3.49* 3.89*  --  3.67*    Estimated Creatinine Clearance: 25.2 mL/min (by C-G formula based on Cr of 3.67).   Assessment: 74 YOM on coumadin PTA for afib, admitted with SOB, leg swelling, and DOE likely from CHF exacerbation. Coumadin on hold for AV fistula to be placed next week. Pharmacy consulted to begin heparin drip when INR <2.  INR up to 3.67 today. No bleeding issues noted.   PTA Coumadin dose 7.5 mg daily.  Goal of Therapy:  Heparin level 0.3-0.7 units/ml Monitor platelets by anticoagulation protocol: Yes   Plan:  - Heparin drip when INR <2 - Coumadin on hold - please let pharmacy know when to resume post AV fistula - Daily PT/INR  [facility], Pharm.D., BCPS Clinical Pharmacist Pager: [phone] 08/22/2015 10:24 AM

## 2015-08-22 NOTE — Progress Notes (Signed)
Primary cardiologist: Dr. Pierre Bali  Seen for followup: Acute on chronic combined heart failure  Subjective:    Sitting in chair eating lunch. States leg edema better since admission, breathing comfortably at rest. No chest pain.  Objective:   Temp:  [98 F (36.7 C)-98.5 F (36.9 C)] 98.5 F (36.9 C) (02/04 1232) Pulse Rate:  [51-89] 89 (02/04 1232) Resp:  [20-28] 20 (02/04 1232) BP: (104-127)/(54-65) 104/54 mmHg (02/04 1232) SpO2:  [93 %-97 %] 93 % (02/04 1232) Weight:  [283 lb 14.4 oz (128.776 kg)] 283 lb 14.4 oz (128.776 kg) (02/04 0500) Last BM Date: 08/20/15  Filed Weights   08/20/15 0746 08/21/15 0344 08/22/15 0500  Weight: 283 lb 3.2 oz (128.459 kg) 284 lb 11.2 oz (129.139 kg) 283 lb 14.4 oz (128.776 kg)    Intake/Output Summary (Last 24 hours) at 08/22/15 1303 Last data filed at 08/22/15 1012  Gross per 24 hour  Intake   1020 ml  Output    450 ml  Net    570 ml    Telemetry: Brief episodes of NSVT noted.  Exam:  General: Overweight male, appears comfortable at rest.  Lungs: Decreased breath sounds at the bases.  Cardiac: Irregular with 2/6 systolic murmur at the apex.  Extremities: Chronic appearing leg edema, improved per patient.  Lab Results:  Basic Metabolic Panel:  Recent Labs Lab 08/20/15 0500 08/21/15 0400 08/22/15 0859  NA 138 140 138  K 3.6 3.7 3.2*  CL 101 98* 94*  CO2 24 31 28   GLUCOSE 106* 116* 128*  BUN 96* 100* 102*  CREATININE 3.49* 3.89* 3.67*  CALCIUM 9.7 9.5 9.8    Liver Function Tests:  Recent Labs Lab 08/19/15 0348 08/20/15 0500 08/21/15 0400 08/22/15 0859  AST 34 36 34  --   ALT 17 17 17   --   ALKPHOS 103 99 102  --   BILITOT 0.8 0.7 0.8  --   PROT 7.3 6.9 7.3  --   ALBUMIN 4.0 3.8 3.6 3.7    CBC:  Recent Labs Lab 08/19/15 0348 08/20/15 0500 08/21/15 0400  WBC 5.6 6.1 5.3  HGB 9.1* 9.5* 9.2*  HCT 28.3* 28.4* 28.4*  MCV 89.6 88.5 89.3  PLT 155 130* 131*    Cardiac Enzymes:  Recent  Labs Lab 08/17/15 2248 08/18/15 0413 08/18/15 1112  TROPONINI 0.07* 0.08* 0.08*    Coagulation:  Recent Labs Lab 08/20/15 0500 08/21/15 0400 08/22/15 0401  INR 3.26* 3.53* 3.21*    Echocardiogram 08/18/2015: Study Conclusions  - Left ventricle: The cavity size was normal. There was moderate concentric hypertrophy. Systolic function was mildly to moderately reduced. The estimated ejection fraction was in the range of 40% to 45%. Diffuse hypokinesis. - Ventricular septum: The contour showed diastolic flattening. - Aortic valve: Trileaflet; moderately thickened, moderately calcified leaflets. - Aorta: Aortic root dimension: 39 mm (ED). - Aortic root: The aortic root was mildly dilated. - Mitral valve: Moderately calcified annulus. - Left atrium: The atrium was moderately dilated. - Right ventricle: The cavity size was severely dilated. Wall thickness was normal. Systolic function was mildly to moderately reduced. - Right atrium: The atrium was severely dilated. - Tricuspid valve: There was severe regurgitation. - Pulmonary arteries: Systolic pressure was mildly to moderately increased. PA peak pressure: 44 mm Hg (S).  Impressions:  - When compared to prior, RV size remains dilated.  Medications:   Scheduled Medications: . allopurinol  200 mg Oral Daily  . amLODipine  10 mg Oral  Daily  . carvedilol  3.125 mg Oral BID  . darbepoetin (ARANESP) injection - NON-DIALYSIS  60 mcg Subcutaneous Q Fri-1800  . furosemide  80 mg Oral BID  . guaiFENesin  600 mg Oral BID  . hydrALAZINE  50 mg Oral BID  . hydroxychloroquine  200 mg Oral BID  . isosorbide mononitrate  30 mg Oral Daily  . multivitamin with minerals  1 tablet Oral Daily  . polyethylene glycol  17 g Oral Daily  . potassium chloride  20 mEq Oral BID  . sodium chloride flush  3 mL Intravenous Q12H  . terazosin  1 mg Oral QHS     PRN Medications:  sodium chloride, acetaminophen, albuterol,  ondansetron (ZOFRAN) IV, oxymetazoline, sodium chloride flush, sorbitol, zolpidem   Assessment:   1. Acute on chronic combined heart failure with LVEF 40-45%. He is status post right-sided thoracentesis for management of pleural effusion with symptomatic improvement, and was recently changed from IV to oral diuretic treatment. Urine output has dropped off somewhat. Creatinine stable at 3.6.  2. Acute on chronic kidney disease, followed by Nephrology. Stage IV disease.  3. Chronic atrial fibrillation, heart rate controlled and on Coumadin.   Plan/Discussion:    Patient has been changed to oral Lasix. Otherwise plan to continue Coreg, Norvasc, hydralazine, Imdur, and potassium supplements. Watch urine output and creatinine.   Satira Sark, M.D., F.A.C.C.

## 2015-08-23 DIAGNOSIS — N179 Acute kidney failure, unspecified: Secondary | ICD-10-CM | POA: Insufficient documentation

## 2015-08-23 DIAGNOSIS — I509 Heart failure, unspecified: Secondary | ICD-10-CM

## 2015-08-23 LAB — RENAL FUNCTION PANEL
ANION GAP: 13 (ref 5–15)
Albumin: 3.8 g/dL (ref 3.5–5.0)
BUN: 105 mg/dL — ABNORMAL HIGH (ref 6–20)
CALCIUM: 9.5 mg/dL (ref 8.9–10.3)
CHLORIDE: 98 mmol/L — AB (ref 101–111)
CO2: 28 mmol/L (ref 22–32)
Creatinine, Ser: 3.83 mg/dL — ABNORMAL HIGH (ref 0.61–1.24)
GFR, EST AFRICAN AMERICAN: 17 mL/min — AB (ref >60)
GFR, EST NON AFRICAN AMERICAN: 14 mL/min — AB (ref >60)
Glucose, Bld: 103 mg/dL — ABNORMAL HIGH (ref 65–99)
Phosphorus: 3.7 mg/dL (ref 2.5–4.6)
Potassium: 3.8 mmol/L (ref 3.5–5.1)
Sodium: 139 mmol/L (ref 135–145)

## 2015-08-23 LAB — PROTIME-INR
INR: 2.54 — AB (ref 0.00–1.49)
INR: 2.37 — ABNORMAL HIGH (ref 0.00–1.49)
PROTHROMBIN TIME: 27 s — AB (ref 11.6–15.2)
PROTHROMBIN TIME: 25.7 s — AB (ref 11.6–15.2)

## 2015-08-23 MED ORDER — FUROSEMIDE 80 MG PO TABS
80.0000 mg | ORAL_TABLET | Freq: Three times a day (TID) | ORAL | Status: DC
  Administered 2015-08-23 – 2015-08-28 (×14): 80 mg via ORAL
  Filled 2015-08-23 (×14): qty 1

## 2015-08-23 NOTE — Progress Notes (Signed)
[facility] Progress Note    Assessment/ Plan:   1. Acute on chronic kidney disease stage IV: Patient with h/o CKD from underlying hypertensive nephrosclerosis, NSAID nephropathy, and chronic cardiorenal syndrome presenting with worsening renal function in the setting of an acute CHF exacerbation.  FEurea is 12.95%, suggestive of a pre-renal azotemia. UOP without significant improvement today (1L out, net -161cc in the last 24hrs). His weight has increased by 1 pound.  No need for emergent HD, however BUN continues to rise, SCr increased from 3.67 >3.83 and patient remains markedly volume overload despite diuresis.  Metolazone discontinued 08/21/15 given rise in BUN/Creatinine. Lasix transitioned to 80mg  PO BID. Vein mapping completed - awaiting Vascular surgery access placement, most likely a left brachial cephalic AV fistula. Coumadin stopped, heparin started for possible fistula placement on Wednesday if INR improves.   2. Acute on chronic combined systolic and diastolic CHF exacerbation: Likely contributing on #1. Repeat echo this hospitalization with a slightly decreased EF to 40-45%. Advanced heart failure team following. Continue Lasix.  Continue coreg, not on ACE inhibitor due to worsening kidney function in the past.    3. Normocytic Anemia: Hgb appears stable this admission.  No evidence of active bleeding. Noted to be iron deficient on recent labwork. Continue IV iron.   4. Atrial fibrillation: currently rate controlled, currently bradycardic. Transitioned from coumadin to heparin for potential surgery on Wednesday. No evidence of active bleeding.  Will need INR normalized prior to vascular surgery access placement  5. Hypertension: BPs generally at goal, sometimes low to 100s/60s.  Continue with current regimen per primary team. If patient requires HD, would suggest backing off antihypertensives.   6. Gout: No active flare currently. On allopurinol. Renally dose colchine  if patient has an acute flare in the future.  Subjective:   Patient doing well, constipated. Denies chest pain, cough, or SOB.      Objective:   BP 120/67 mmHg  Pulse 57  Temp(Src) 97.9 F (36.6 C) (Oral)  Resp 20  Ht 6' (1.829 m)  Wt 284 lb 6.4 oz (129.003 kg)  BMI 38.56 kg/m2  SpO2 96%  Intake/Output Summary (Last 24 hours) at 08/23/15 0936 Last data filed at 08/23/15 0848  Gross per 24 hour  Intake   1320 ml  Output   1101 ml  Net    219 ml   Weight change: 8 oz (0.227 kg)  Physical Exam: General: Sitting up on the edge of the bedcomfortably. Non-toxic Cardiovascular: Bradycardic, regular rhythm. II/VI systolic murmur. No rubs, or gallops noted.  Respiratory: No increased WOB. CTAB without wheezing, rhonchi, or crackles noted.  Abdomen: +BS, soft, non-distended, non-tender.  Ext: 2+ to 3+ pitting edema in b/l LEs   Imaging: No results found.  Labs: BMET  Recent Labs Lab 08/17/15 1535 08/18/15 0413 08/19/15 0348 08/20/15 0500 08/21/15 0400 08/22/15 0859 08/23/15 0404  NA 139 143 140 138 140 138 139  K 4.0 3.8 3.7 3.6 3.7 3.2* 3.8  CL 102 103 99* 101 98* 94* 98*  CO2 24 27 28 24 31 28 28   GLUCOSE 126* 92 111* 106* 116* 128* 103*  BUN 91* 91* 91* 96* 100* 102* 105*  CREATININE 3.64* 3.70* 3.39* 3.49* 3.89* 3.67* 3.83*  CALCIUM 9.6 10.0 9.8 9.7 9.5 9.8 9.5  PHOS  --   --   --   --   --  4.0 3.7   CBC  Recent Labs Lab 08/16/15 2350 08/17/15 1535 08/18/15 0413 08/19/15 0348 08/20/15  0500 08/21/15 0400  WBC 5.8 6.1 5.5 5.6 6.1 5.3  NEUTROABS 4.0 4.8 3.9  --   --   --   HGB 9.6* 9.2* 9.2* 9.1* 9.5* 9.2*  HCT 29.8* 28.7* 28.9* 28.3* 28.4* 28.4*  MCV 89.5 89.1 88.7 89.6 88.5 89.3  PLT 168 159 162 155 130* 131*    Medications:    . allopurinol  200 mg Oral Daily  . amLODipine  10 mg Oral Daily  . carvedilol  3.125 mg Oral BID  . darbepoetin (ARANESP) injection - NON-DIALYSIS  60 mcg Subcutaneous Q Fri-1800  . furosemide  80 mg Oral BID  .  guaiFENesin  600 mg Oral BID  . hydrALAZINE  50 mg Oral BID  . hydroxychloroquine  200 mg Oral BID  . isosorbide mononitrate  30 mg Oral Daily  . multivitamin with minerals  1 tablet Oral Daily  . polyethylene glycol  17 g Oral Daily  . sodium chloride flush  3 mL Intravenous Q12H  . terazosin  1 mg Oral QHS    Archie Patten, MD  PGY-2,  [facility] 08/23/2015 9:36 AM

## 2015-08-23 NOTE — Progress Notes (Signed)
Primary cardiologist: Dr. Pierre Bali  Seen for followup: Acute on chronic combined heart failure  Subjective:    Change or change in symptoms. Has had some constipation. No chest pain or shortness of breath at rest.  Objective:   Temp:  [97.9 F (36.6 C)-98.5 F (36.9 C)] 97.9 F (36.6 C) (02/05 0421) Pulse Rate:  [51-89] 57 (02/05 0421) Resp:  [20] 20 (02/05 0421) BP: (102-131)/(54-67) 114/60 mmHg (02/05 1019) SpO2:  [93 %-96 %] 96 % (02/04 1941) Weight:  [284 lb 6.4 oz (129.003 kg)] 284 lb 6.4 oz (129.003 kg) (02/05 0421) Last BM Date: 08/22/15  Filed Weights   08/21/15 0344 08/22/15 0500 08/23/15 0421  Weight: 284 lb 11.2 oz (129.139 kg) 283 lb 14.4 oz (128.776 kg) 284 lb 6.4 oz (129.003 kg)    Intake/Output Summary (Last 24 hours) at 08/23/15 1107 Last data filed at 08/23/15 0848  Gross per 24 hour  Intake   1080 ml  Output    901 ml  Net    179 ml    Telemetry: Brief episodes of NSVT noted.  Exam:  General: Overweight male, appears comfortable at rest.  Lungs: Decreased breath sounds at the bases.  Cardiac: Irregular with 2/6 systolic murmur at the apex.  Extremities: Chronic appearing leg edema, compression hose placed this morning.  Lab Results:  Basic Metabolic Panel:  Recent Labs Lab 08/21/15 0400 08/22/15 0859 08/23/15 0404  NA 140 138 139  K 3.7 3.2* 3.8  CL 98* 94* 98*  CO2 31 28 28   GLUCOSE 116* 128* 103*  BUN 100* 102* 105*  CREATININE 3.89* 3.67* 3.83*  CALCIUM 9.5 9.8 9.5    Liver Function Tests:  Recent Labs Lab 08/19/15 0348 08/20/15 0500 08/21/15 0400 08/22/15 0859 08/23/15 0404  AST 34 36 34  --   --   ALT 17 17 17   --   --   ALKPHOS 103 99 102  --   --   BILITOT 0.8 0.7 0.8  --   --   PROT 7.3 6.9 7.3  --   --   ALBUMIN 4.0 3.8 3.6 3.7 3.8    CBC:  Recent Labs Lab 08/19/15 0348 08/20/15 0500 08/21/15 0400  WBC 5.6 6.1 5.3  HGB 9.1* 9.5* 9.2*  HCT 28.3* 28.4* 28.4*  MCV 89.6 88.5 89.3  PLT 155  130* 131*    Cardiac Enzymes:  Recent Labs Lab 08/17/15 2248 08/18/15 0413 08/18/15 1112  TROPONINI 0.07* 0.08* 0.08*    Coagulation:  Recent Labs Lab 08/21/15 0400 08/22/15 0401 08/23/15 0928  INR 3.53* 3.21* 2.54*    Echocardiogram 08/18/2015: Study Conclusions  - Left ventricle: The cavity size was normal. There was moderate concentric hypertrophy. Systolic function was mildly to moderately reduced. The estimated ejection fraction was in the range of 40% to 45%. Diffuse hypokinesis. - Ventricular septum: The contour showed diastolic flattening. - Aortic valve: Trileaflet; moderately thickened, moderately calcified leaflets. - Aorta: Aortic root dimension: 39 mm (ED). - Aortic root: The aortic root was mildly dilated. - Mitral valve: Moderately calcified annulus. - Left atrium: The atrium was moderately dilated. - Right ventricle: The cavity size was severely dilated. Wall thickness was normal. Systolic function was mildly to moderately reduced. - Right atrium: The atrium was severely dilated. - Tricuspid valve: There was severe regurgitation. - Pulmonary arteries: Systolic pressure was mildly to moderately increased. PA peak pressure: 44 mm Hg (S).  Impressions:  - When compared to prior, RV size remains dilated.  Medications:   Scheduled Medications: . allopurinol  200 mg Oral Daily  . amLODipine  10 mg Oral Daily  . carvedilol  3.125 mg Oral BID  . darbepoetin (ARANESP) injection - NON-DIALYSIS  60 mcg Subcutaneous Q Fri-1800  . furosemide  80 mg Oral BID  . guaiFENesin  600 mg Oral BID  . hydrALAZINE  50 mg Oral BID  . hydroxychloroquine  200 mg Oral BID  . isosorbide mononitrate  30 mg Oral Daily  . multivitamin with minerals  1 tablet Oral Daily  . polyethylene glycol  17 g Oral Daily  . sodium chloride flush  3 mL Intravenous Q12H  . terazosin  1 mg Oral QHS    PRN Medications: sodium chloride, acetaminophen, albuterol,  ondansetron (ZOFRAN) IV, oxymetazoline, sodium chloride flush, sorbitol, zolpidem   Assessment:   1. Acute on chronic combined heart failure with LVEF 40-45%. He is status post right-sided thoracentesis for management of pleural effusion with symptomatic improvement, and was recently changed from IV to oral diuretic treatment. Urine output has dropped off somewhat. Creatinine stable currently 3.8.  2. Acute on chronic kidney disease, followed by Nephrology. Stage IV disease. He is having a vascular access placed this week in anticipation of hemodialysis at a later date.  3. Chronic atrial fibrillation, heart rate controlled and on Coumadin.   Plan/Discussion:    Change Lasix dose to 80 mg by mouth 3 times a day. Otherwise plan to continue Coreg, Norvasc, hydralazine, Imdur, and potassium supplements. Watch urine output and creatinine.   Satira Sark, M.D., F.A.C.C.

## 2015-08-23 NOTE — Progress Notes (Signed)
[place] for heparin when INR <2 Indication: atrial fibrillation  Allergies  Allergen Reactions  . Ace Inhibitors Other (See Comments)    Worsening renal insufficiency    Patient Measurements: Height: 6' (182.9 cm) Weight: 284 lb 6.4 oz (129.003 kg) IBW/kg (Calculated) : 77.6  Vital Signs: Temp: 97.9 F (36.6 C) (02/05 0421) Temp Source: Oral (02/05 0421) BP: 114/60 mmHg (02/05 1019) Pulse Rate: 57 (02/05 0421)  Labs:  Recent Labs  08/21/15 0400 08/22/15 0401 08/22/15 0859 08/23/15 0404 08/23/15 0928  HGB 9.2*  --   --   --   --   HCT 28.4*  --   --   --   --   PLT 131*  --   --   --   --   LABPROT 34.6* 32.2*  --   --  27.0*  INR 3.53* 3.21*  --   --  2.54*  CREATININE 3.89*  --  3.67* 3.83*  --     Estimated Creatinine Clearance: 24.2 mL/min (by C-G formula based on Cr of 3.83).   Assessment: 74 YOM on coumadin PTA for afib, admitted with SOB, leg swelling, and DOE likely from CHF exacerbation. Coumadin on hold for AV fistula to be placed Wed 2/8. Pharmacy consulted to begin heparin drip when INR <2.  INR down to 2.54. No bleeding issues noted.   PTA Coumadin dose 7.5 mg daily.  Goal of Therapy:  Heparin level 0.3-0.7 units/ml Monitor platelets by anticoagulation protocol: Yes   Plan:  - Heparin drip when INR <2 - 22:00 INR as suspect will be ~2 - Coumadin on hold - please let pharmacy know when to resume post AV fistula - Daily PT/INR  [facility], Pharm.D., BCPS Clinical Pharmacist Pager: [phone] 08/23/2015 10:53 AM

## 2015-08-23 NOTE — Progress Notes (Signed)
[facility] PROGRESS NOTE   Bruce Cole [name] DOB: 1941-06-29 DOA: 08/17/2015 PCP: Nance Pear., NP  HPI/Subjective: Noted the vascular surgery consultation, nephrology changing the Lasix to oral. Off of Coumadin, anticipating procedure.  Assessment/Plan: Principal Problem:   Acute on chronic combined systolic (congestive) and diastolic (congestive) heart failure (HCC) Active Problems:   Essential hypertension   ATRIAL FIBRILLATION   Diabetes mellitus type 2, controlled, with complications ([place])   Long term (current) use of anticoagulants   CKD (chronic kidney disease), stage IV (HCC)   Acute renal failure superimposed on stage 3 chronic kidney disease (HCC)   Pleural effusion on right   S/P thoracentesis   Bradycardia   Chronic atrial fibrillation (HCC)   Acute on chronic combined systolic and diastolic CHF exacerbation ([place]):  -Patient with increased weight gain not relieved by po Lasix. Patient with +2 pitting edema lower extremity.  -BNP elevated at 204.1 on admission. Last EF 45-50% showing combined systolic and diastolic dysfunction back in 03/2013. -Consult cardiology patient is seen by Dr. Haroldine Laws -Echocardiogram in a.m. -Follow intake/output, daily weight, elevate legs and salt and fluid restriction. -Lasix switched to oral, Lasix 80 mg 3 times a day.  Acute renal failure on chronic kidney disease stage IV -Patient's kidney function has progressively worsened over the last 2 weeks from 2.59 on 1/17 to 3.64 on admission. -Renal ultrasound showed medical renal disease. -I appreciate nephrology and vascular surgery help, plans for vascular access noted.   Pleural effusion -Right-sided pleural effusion status post removal of 1.5 L. -Likely effusion secondary to CHF.  Diabetes mellitus type 2 -Controlled diabetes mellitus type 2, hemoglobin A1c is 6.7 on 08/04/15. -Carbohydrate modified diet and insulin sliding scale while was in the  hospital.  Atrial fibrillation on Coumadin -INR therapeutic at 2.65 in patient appears to be rate controlled at this time. CHA2DS2-VASc score 4. -Coumadin stopped, will be on IV heparin anticipating fistula creation.  Hypertension -Continue amlodipine, Coreg, and other blood pressure medications as listed above  Question of acute urinary retention -Check bladder scan and place Foley if greater than 300 mL of fluid present.   Code Status: Full Code Family Communication: Plan discussed with the patient. Disposition Plan: Remains inpatient Diet: Diet Heart Room service appropriate?: Yes; Fluid consistency:: Thin; Fluid restriction:: 1200 mL Fluid  Consultants:  Heart failure team  Procedures:  None  Antibiotics:  None   Objective: Filed Vitals:   08/23/15 0421 08/23/15 1019  BP: 120/67 114/60  Pulse: 57   Temp: 97.9 F (36.6 C)   Resp: 20     Intake/Output Summary (Last 24 hours) at 08/23/15 1132 Last data filed at 08/23/15 0848  Gross per 24 hour  Intake   1080 ml  Output    901 ml  Net    179 ml   Filed Weights   08/21/15 0344 08/22/15 0500 08/23/15 0421  Weight: 129.139 kg (284 lb 11.2 oz) 128.776 kg (283 lb 14.4 oz) 129.003 kg (284 lb 6.4 oz)    Exam: General: Alert and awake, oriented x3, not in any acute distress. HEENT: anicteric sclera, pupils reactive to light and accommodation, EOMI CVS: S1-S2 clear, no murmur rubs or gallops Chest: clear to auscultation bilaterally, no wheezing, rales or rhonchi Abdomen: soft nontender, nondistended, normal bowel sounds, no organomegaly Extremities: no cyanosis, has +2 edema noted bilaterally Neuro: Cranial nerves II-XII intact, no focal neurological deficits  Data Reviewed: Basic Metabolic Panel:  Recent Labs Lab 08/19/15 0348 08/20/15 0500 08/21/15 0400 08/22/15  0859 08/23/15 0404  NA 140 138 140 138 139  K 3.7 3.6 3.7 3.2* 3.8  CL 99* 101 98* 94* 98*  CO2 28 24 31 28 28   GLUCOSE 111* 106* 116*  128* 103*  BUN 91* 96* 100* 102* 105*  CREATININE 3.39* 3.49* 3.89* 3.67* 3.83*  CALCIUM 9.8 9.7 9.5 9.8 9.5  PHOS  --   --   --  4.0 3.7   Liver Function Tests:  Recent Labs Lab 08/19/15 0348 08/20/15 0500 08/21/15 0400 08/22/15 0859 08/23/15 0404  AST 34 36 34  --   --   ALT 17 17 17   --   --   ALKPHOS 103 99 102  --   --   BILITOT 0.8 0.7 0.8  --   --   PROT 7.3 6.9 7.3  --   --   ALBUMIN 4.0 3.8 3.6 3.7 3.8   No results for input(s): LIPASE, AMYLASE in the last 168 hours. No results for input(s): AMMONIA in the last 168 hours. CBC:  Recent Labs Lab 08/16/15 2350 08/17/15 1535 08/18/15 0413 08/19/15 0348 08/20/15 0500 08/21/15 0400  WBC 5.8 6.1 5.5 5.6 6.1 5.3  NEUTROABS 4.0 4.8 3.9  --   --   --   HGB 9.6* 9.2* 9.2* 9.1* 9.5* 9.2*  HCT 29.8* 28.7* 28.9* 28.3* 28.4* 28.4*  MCV 89.5 89.1 88.7 89.6 88.5 89.3  PLT 168 159 162 155 130* 131*   Cardiac Enzymes:  Recent Labs Lab 08/16/15 2350 08/17/15 1535 08/17/15 2248 08/18/15 0413 08/18/15 1112  TROPONINI 0.06* 0.08* 0.07* 0.08* 0.08*   BNP (last 3 results)  Recent Labs  08/12/15 1930 08/16/15 2350 08/17/15 1535  BNP 178.6* 174.2* 204.1*    ProBNP (last 3 results) No results for input(s): PROBNP in the last 8760 hours.  CBG: No results for input(s): GLUCAP in the last 168 hours.  Micro Recent Results (from the past 240 hour(s))  Culture, body fluid-bottle     Status: None (Preliminary result)   Collection Time: 08/19/15  1:45 PM  Result Value Ref Range Status   Specimen Description FLUID RIGHT PLEURAL  Final   Special Requests BOTTLES DRAWN AEROBIC AND ANAEROBIC 10CCS  Final   Culture NO GROWTH 3 DAYS  Final   Report Status PENDING  Incomplete  Gram stain     Status: None   Collection Time: 08/19/15  1:45 PM  Result Value Ref Range Status   Specimen Description FLUID RIGHT PLEURAL  Final   Special Requests NONE  Final   Gram Stain   Final    FEW WBC PRESENT, PREDOMINANTLY PMN NO  ORGANISMS SEEN    Report Status 08/19/2015 FINAL  Final     Studies: No results found.  Scheduled Meds: . allopurinol  200 mg Oral Daily  . amLODipine  10 mg Oral Daily  . carvedilol  3.125 mg Oral BID  . darbepoetin (ARANESP) injection - NON-DIALYSIS  60 mcg Subcutaneous Q Fri-1800  . furosemide  80 mg Oral TID  . guaiFENesin  600 mg Oral BID  . hydrALAZINE  50 mg Oral BID  . hydroxychloroquine  200 mg Oral BID  . isosorbide mononitrate  30 mg Oral Daily  . multivitamin with minerals  1 tablet Oral Daily  . polyethylene glycol  17 g Oral Daily  . sodium chloride flush  3 mL Intravenous Q12H  . terazosin  1 mg Oral QHS   Continuous Infusions:      Time spent: 35 minutes  [facility]  [facility] Pager [phone] If 7PM-7AM, please contact night-coverage at [email or web address], password [facility] 08/23/2015, 11:32 AM  LOS: 6 days

## 2015-08-24 DIAGNOSIS — N19 Unspecified kidney failure: Secondary | ICD-10-CM | POA: Insufficient documentation

## 2015-08-24 LAB — CULTURE, BODY FLUID-BOTTLE

## 2015-08-24 LAB — RENAL FUNCTION PANEL
ALBUMIN: 3.8 g/dL (ref 3.5–5.0)
Anion gap: 14 (ref 5–15)
BUN: 105 mg/dL — AB (ref 6–20)
CO2: 30 mmol/L (ref 22–32)
CREATININE: 3.44 mg/dL — AB (ref 0.61–1.24)
Calcium: 9.6 mg/dL (ref 8.9–10.3)
Chloride: 94 mmol/L — ABNORMAL LOW (ref 101–111)
GFR calc Af Amer: 19 mL/min — ABNORMAL LOW (ref >60)
GFR, EST NON AFRICAN AMERICAN: 16 mL/min — AB (ref >60)
GLUCOSE: 103 mg/dL — AB (ref 65–99)
PHOSPHORUS: 3.1 mg/dL (ref 2.5–4.6)
Potassium: 3 mmol/L — ABNORMAL LOW (ref 3.5–5.1)
Sodium: 138 mmol/L (ref 135–145)

## 2015-08-24 LAB — CULTURE, BODY FLUID W GRAM STAIN -BOTTLE: Culture: NO GROWTH

## 2015-08-24 LAB — PROTIME-INR
INR: 2.3 — AB (ref 0.00–1.49)
PROTHROMBIN TIME: 25.1 s — AB (ref 11.6–15.2)

## 2015-08-24 MED ORDER — POTASSIUM CHLORIDE CRYS ER 20 MEQ PO TBCR
40.0000 meq | EXTENDED_RELEASE_TABLET | Freq: Four times a day (QID) | ORAL | Status: AC
  Administered 2015-08-24 (×2): 40 meq via ORAL
  Filled 2015-08-24 (×2): qty 2

## 2015-08-24 NOTE — Progress Notes (Signed)
Advanced Heart Failure Rounding Note Referring Physician: Dr Allyson Sabal Primary Physician: Debbrah Alar, NP Primary Cardiologist: Dr. Haroldine Laws   Reason for Consultation: A/C diastolic HF  Subjective:    S/p R thoracentesis 08/19/15 with 1.5 L of hazy, yellow fluid. Transudative by labs.  Vascular consult 08/21/15. Plan for dialysis access placement this admission, 08/26/15. Per nephrology, pending rest of course this admission, may need dialysis initiation during this admission via tunneled cath.   Only out 3.2 L this admission and weight relatively stable.  He remains volume overloaded. Creatinine slightly improved from yesterday but remains elevated in 3s with BUN > 100 for several days. Neprhology adjusting diuretic regimen at this time.   Feels OK today.  Still having mild SOB with exertion.  Still with edema into thighs.  Apprehensive about possible dialysis initiation this admission. Peeing with po lasix.   Echo 08/18/15 - LVEF 40-45%, diffuse hypokinesis, severe TR, severe RAE, RV severely dilated, mild/moderately reduced    Objective:   Weight Range: 283 lb 6.4 oz (128.549 kg) Body mass index is 38.43 kg/(m^2).   Vital Signs:   Temp:  [97.3 F (36.3 C)-98.1 F (36.7 C)] 98.1 F (36.7 C) (02/06 0459) Pulse Rate:  [55-60] 60 (02/06 0459) Resp:  [18-20] 18 (02/06 0459) BP: (114-125)/(60-70) 125/63 mmHg (02/06 0459) SpO2:  [95 %-97 %] 95 % (02/06 0459) Weight:  [283 lb 6.4 oz (128.549 kg)] 283 lb 6.4 oz (128.549 kg) (02/06 0459) Last BM Date: 08/23/15  Weight change: Filed Weights   08/22/15 0500 08/23/15 0421 08/24/15 0459  Weight: 283 lb 14.4 oz (128.776 kg) 284 lb 6.4 oz (129.003 kg) 283 lb 6.4 oz (128.549 kg)    Intake/Output:   Intake/Output Summary (Last 24 hours) at 08/24/15 0739 Last data filed at 08/24/15 0502  Gross per 24 hour  Intake   1060 ml  Output   1575 ml  Net   -515 ml     Physical Exam: General: Well appearing. No resp difficulty.  Lying flat HEENT: normal Neck: supple. JVP remains elevated 10+ cm. Carotids 2+ bilaterally; no bruits. No thyromegaly or nodule noted. Cor: PMI normal. Bradycardic, irregularly irregular. No rubs, gallops. 2/6 early SEM RUSB Lungs: Clear, normal effort Abdomen: soft, non-tender, nondistended, no HSM. No bruits or masses. +BS  Extremities: no cyanosis, clubbing, rash. 1+ edema into thighs.   Neuro: alert & orientedx3, cranial nerves grossly intact. Moves all 4 extremities w/o difficulty. Affect pleasant.  Telemetry: Reviewed personally, afib 50-60s  Labs: CBC No results for input(s): WBC, NEUTROABS, HGB, HCT, MCV, PLT in the last 72 hours. Basic Metabolic Panel  Recent Labs  08/23/15 0404 08/24/15 0433  NA 139 138  K 3.8 3.0*  CL 98* 94*  CO2 28 30  GLUCOSE 103* 103*  BUN 105* 105*  CREATININE 3.83* 3.44*  CALCIUM 9.5 9.6  PHOS 3.7 3.1   Liver Function Tests  Recent Labs  08/23/15 0404 08/24/15 0433  ALBUMIN 3.8 3.8   No results for input(s): LIPASE, AMYLASE in the last 72 hours. Cardiac Enzymes No results for input(s): CKTOTAL, CKMB, CKMBINDEX, TROPONINI in the last 72 hours.  BNP: BNP (last 3 results)  Recent Labs  08/12/15 1930 08/16/15 2350 08/17/15 1535  BNP 178.6* 174.2* 204.1*    ProBNP (last 3 results) No results for input(s): PROBNP in the last 8760 hours.   D-Dimer No results for input(s): DDIMER in the last 72 hours. Hemoglobin A1C No results for input(s): HGBA1C in the last 72 hours.  Fasting Lipid Panel No results for input(s): CHOL, HDL, LDLCALC, TRIG, CHOLHDL, LDLDIRECT in the last 72 hours. Thyroid Function Tests No results for input(s): TSH, T4TOTAL, T3FREE, THYROIDAB in the last 72 hours.  Invalid input(s): FREET3  Other results:     Imaging/Studies:  No results found.  Latest Echo  Latest Cath   Medications:     Scheduled Medications: . allopurinol  200 mg Oral Daily  . amLODipine  10 mg Oral Daily  .  carvedilol  3.125 mg Oral BID  . darbepoetin (ARANESP) injection - NON-DIALYSIS  60 mcg Subcutaneous Q Fri-1800  . furosemide  80 mg Oral TID  . guaiFENesin  600 mg Oral BID  . hydrALAZINE  50 mg Oral BID  . hydroxychloroquine  200 mg Oral BID  . isosorbide mononitrate  30 mg Oral Daily  . multivitamin with minerals  1 tablet Oral Daily  . polyethylene glycol  17 g Oral Daily  . sodium chloride flush  3 mL Intravenous Q12H  . terazosin  1 mg Oral QHS    Infusions:    PRN Medications: sodium chloride, acetaminophen, albuterol, ondansetron (ZOFRAN) IV, oxymetazoline, sodium chloride flush, sorbitol, zolpidem   Assessment   1. Chronic combined HF - Echo 03/2013 45-50% with grade 2 DD, PA peak 60 mm Hg 2. AKI on CKD IV 3. Chronic afib on coumadin 4. Pleural effusion on right  Plan    Echo 08/18/15 - LVEF 40-45%, diffuse hypokinesis, severe TR, severe RAE, RV severely dilated, mild/moderately reduced, PA peak pressure 44 mm Hg.   He remains volume overloaded on exam. Nephrology now directing diuresis. S/p vascular consult, to have access placed this admission, 08/26/15 with possible initiation of dialysis this hospitilzation via tunneled cath depending on further hospital course.  CKD stage IV.   K 3.0 this am. Supp ordered.  Appreciate renal and vascular input. Diuretic regimen per Renal.  Currently on 80 mg po lasix TID.   Length of Stay: 7   Shirley Friar PA-C 08/24/2015, 7:39 AM  Advanced Heart Failure Team Pager [phone] (M-F; 7a - 4p)  Please contact [facility] for night-coverage after hours (4p -7a ) and weekends on [email or web address]   Patient seen and examined with Oda Kilts, PA-C. We discussed all aspects of the encounter. I agree with the assessment and plan as stated above.   Remains volume overloaded. Poor response to lasix. Agree with HD access placement. Renal now directing care. Will follow at a distance.   Bensimhon, Daniel,MD 9:46 AM

## 2015-08-24 NOTE — Progress Notes (Signed)
[facility] PROGRESS NOTE   Bruce Cole [name] DOB: [birth year] DOA: 08/17/2015 PCP: Nance Pear., NP  HPI/Subjective: Denies any shortness of breath. Appreciate consulting services health. Vascular for dialysis access on Wednesday. Continue Lasix at 80 mg by mouth 3 times a day. Per nephrology if no improvement of SCr consider placement of temporary TDC if requires dialysis prior to maturation of the AVF.  Assessment/Plan: Principal Problem:   Acute on chronic combined systolic (congestive) and diastolic (congestive) heart failure (HCC) Active Problems:   Essential hypertension   ATRIAL FIBRILLATION   Diabetes mellitus type 2, controlled, with complications ([place])   Long term (current) use of anticoagulants   CKD (chronic kidney disease), stage IV (HCC)   Acute renal failure superimposed on stage 3 chronic kidney disease (HCC)   Pleural effusion on right   S/P thoracentesis   Bradycardia   Chronic atrial fibrillation (HCC)   Acute on chronic congestive heart failure (HCC)   AKI (acute kidney injury) ([place])   Acute on chronic combined systolic and diastolic CHF exacerbation ([place]):  -Patient with increased weight gain not relieved by po Lasix. Patient with +2 pitting edema lower extremity.  -BNP elevated at 204.1 on admission. Last EF 45-50% showing combined systolic and diastolic dysfunction back in 03/2013. -Consult cardiology patient is seen by Dr. Haroldine Laws -Echocardiogram in a.m. -Follow intake/output, daily weight, elevate legs, salt and fluid restriction. -Lasix switched to oral, Lasix 80 mg 3 times a day.  Acute renal failure on chronic kidney disease stage IV -Patient's kidney function has progressively worsened over the last 2 weeks from 2.59 on 1/17 to 3.64 on admission. -Renal ultrasound showed medical renal disease. -Dialysis catheter to be placed on Wednesday, consider TDC.  Pleural effusion -Right-sided pleural effusion status post  removal of 1.5 L. -Likely effusion secondary to CHF.  Diabetes mellitus type 2 -Controlled diabetes mellitus type 2, hemoglobin A1c is 6.7 on 08/04/15. -Carbohydrate modified diet and insulin sliding scale while was in the hospital.  Atrial fibrillation on Coumadin -INR therapeutic at 2.65 in patient appears to be rate controlled at this time. CHA2DS2-VASc score 4. -Coumadin stopped, will be on IV heparin anticipating fistula creation.  Hypertension -Continue amlodipine, Coreg, and other blood pressure medications as listed above  Question of acute urinary retention    Code Status: Full Code Family Communication: Plan discussed with the patient. Disposition Plan: Remains inpatient Diet: Diet Heart Room service appropriate?: Yes; Fluid consistency:: Thin; Fluid restriction:: 1200 mL Fluid  Consultants:  Heart failure team  Procedures:  None  Antibiotics:  None   Objective: Filed Vitals:   08/23/15 2144 08/24/15 0459  BP: 125/63 125/63  Pulse: 57 60  Temp: 97.8 F (36.6 C) 98.1 F (36.7 C)  Resp: 20 18    Intake/Output Summary (Last 24 hours) at 08/24/15 1300 Last data filed at 08/24/15 0800  Gross per 24 hour  Intake    580 ml  Output   1700 ml  Net  -1120 ml   Filed Weights   08/22/15 0500 08/23/15 0421 08/24/15 0459  Weight: 128.776 kg (283 lb 14.4 oz) 129.003 kg (284 lb 6.4 oz) 128.549 kg (283 lb 6.4 oz)    Exam: General: Alert and awake, oriented x3, not in any acute distress. HEENT: anicteric sclera, pupils reactive to light and accommodation, EOMI CVS: S1-S2 clear, no murmur rubs or gallops Chest: clear to auscultation bilaterally, no wheezing, rales or rhonchi Abdomen: soft nontender, nondistended, normal bowel sounds, no organomegaly Extremities: no cyanosis, has +  2 edema noted bilaterally Neuro: Cranial nerves II-XII intact, no focal neurological deficits  Data Reviewed: Basic Metabolic Panel:  Recent Labs Lab 08/20/15 0500  08/21/15 0400 08/22/15 0859 08/23/15 0404 08/24/15 0433  NA 138 140 138 139 138  K 3.6 3.7 3.2* 3.8 3.0*  CL 101 98* 94* 98* 94*  CO2 24 31 28 28 30   GLUCOSE 106* 116* 128* 103* 103*  BUN 96* 100* 102* 105* 105*  CREATININE 3.49* 3.89* 3.67* 3.83* 3.44*  CALCIUM 9.7 9.5 9.8 9.5 9.6  PHOS  --   --  4.0 3.7 3.1   Liver Function Tests:  Recent Labs Lab 08/19/15 0348 08/20/15 0500 08/21/15 0400 08/22/15 0859 08/23/15 0404 08/24/15 0433  AST 34 36 34  --   --   --   ALT 17 17 17   --   --   --   ALKPHOS 103 99 102  --   --   --   BILITOT 0.8 0.7 0.8  --   --   --   PROT 7.3 6.9 7.3  --   --   --   ALBUMIN 4.0 3.8 3.6 3.7 3.8 3.8   No results for input(s): LIPASE, AMYLASE in the last 168 hours. No results for input(s): AMMONIA in the last 168 hours. CBC:  Recent Labs Lab 08/17/15 1535 08/18/15 0413 08/19/15 0348 08/20/15 0500 08/21/15 0400  WBC 6.1 5.5 5.6 6.1 5.3  NEUTROABS 4.8 3.9  --   --   --   HGB 9.2* 9.2* 9.1* 9.5* 9.2*  HCT 28.7* 28.9* 28.3* 28.4* 28.4*  MCV 89.1 88.7 89.6 88.5 89.3  PLT 159 162 155 130* 131*   Cardiac Enzymes:  Recent Labs Lab 08/17/15 1535 08/17/15 2248 08/18/15 0413 08/18/15 1112  TROPONINI 0.08* 0.07* 0.08* 0.08*   BNP (last 3 results)  Recent Labs  08/12/15 1930 08/16/15 2350 08/17/15 1535  BNP 178.6* 174.2* 204.1*    ProBNP (last 3 results) No results for input(s): PROBNP in the last 8760 hours.  CBG: No results for input(s): GLUCAP in the last 168 hours.  Micro Recent Results (from the past 240 hour(s))  Culture, body fluid-bottle     Status: None (Preliminary result)   Collection Time: 08/19/15  1:45 PM  Result Value Ref Range Status   Specimen Description FLUID RIGHT PLEURAL  Final   Special Requests BOTTLES DRAWN AEROBIC AND ANAEROBIC 10CCS  Final   Culture NO GROWTH 4 DAYS  Final   Report Status PENDING  Incomplete  Gram stain     Status: None   Collection Time: 08/19/15  1:45 PM  Result Value Ref  Range Status   Specimen Description FLUID RIGHT PLEURAL  Final   Special Requests NONE  Final   Gram Stain   Final    FEW WBC PRESENT, PREDOMINANTLY PMN NO ORGANISMS SEEN    Report Status 08/19/2015 FINAL  Final     Studies: No results found.  Scheduled Meds: . allopurinol  200 mg Oral Daily  . carvedilol  3.125 mg Oral BID  . darbepoetin (ARANESP) injection - NON-DIALYSIS  60 mcg Subcutaneous Q Fri-1800  . furosemide  80 mg Oral TID  . guaiFENesin  600 mg Oral BID  . hydrALAZINE  50 mg Oral BID  . hydroxychloroquine  200 mg Oral BID  . isosorbide mononitrate  30 mg Oral Daily  . multivitamin with minerals  1 tablet Oral Daily  . polyethylene glycol  17 g Oral Daily  . potassium chloride  40 mEq Oral Q6H  . sodium chloride flush  3 mL Intravenous Q12H  . terazosin  1 mg Oral QHS   Continuous Infusions:      Time spent: 35 minutes    [facility]  [facility] Pager [phone] If 7PM-7AM, please contact night-coverage at [email or web address], password [facility] 08/24/2015, 1:00 PM  LOS: 7 days

## 2015-08-24 NOTE — Progress Notes (Signed)
Physical Therapy Treatment Patient Details Name: Bruce Cole MRN: [name] DOB: 1940/10/01 Today's Date: 08/24/2015    History of Present Illness Patient is a 75 y/o male with hx of A-fib, gout, CHF, CKD presents with increasing SOB, leg swelling, and DOE. BNP at 204.1. Admitted with Acute on chronic combined systolic and diastolic CHF exacerbation.    PT Comments    Patient seen for mobility progression. Patient remains limited by hip pain, LE fatigue and DOE. Patient with heavy reliance on RW for mobility. Educated patient on pursed lip breathing technique with fatigue. Will continue to see and progress as tolerated.  Follow Up Recommendations  Home health PT;Supervision - Intermittent     Equipment Recommendations  None recommended by PT    Recommendations for Other Services       Precautions / Restrictions Precautions Precautions: Fall Restrictions Weight Bearing Restrictions: No    Mobility  Bed Mobility Overal bed mobility: Needs Assistance Bed Mobility: Supine to Sit     Supine to sit: Modified independent (Device/Increase time);HOB elevated     General bed mobility comments: Increased time but no assist needed. Use of rail.  Transfers Overall transfer level: Needs assistance Equipment used: Rolling walker (2 wheeled) Transfers: Sit to/from Stand Sit to Stand: Supervision         General transfer comment: Supervision to come to standing, increased time to perform secondary to hip pain, flexed posture in standing, VCs for upright positioning  Ambulation/Gait Ambulation/Gait assistance: Supervision Ambulation Distance (Feet): 110 Feet Assistive device: Rolling walker (2 wheeled) Gait Pattern/deviations: Step-through pattern;Decreased stride length;Wide base of support;Trunk flexed Gait velocity: decreased Gait velocity interpretation: <1.8 ft/sec, indicative of risk for recurrent falls General Gait Details: significantly flexed trunk, VCs for  upright posture. Heavy reliance on RW for support with extensive grip, educated patient to losen grip to address forarm fatigue. Very slow and cautious with gait   Stairs            Wheelchair Mobility    Modified Rankin (Stroke Patients Only)       Balance Overall balance assessment: Needs assistance Sitting-balance support: Feet supported Sitting balance-Leahy Scale: Good Sitting balance - Comments: Able to donn sock with increased effort, time and difficulty reaching outside BoS.   Standing balance support: During functional activity Standing balance-Leahy Scale: Poor Standing balance comment: heavy reliance on RW secondary to hip pain and LE fatigue with activity                    Cognition Arousal/Alertness: Awake/alert Behavior During Therapy: WFL for tasks assessed/performed Overall Cognitive Status: Within Functional Limits for tasks assessed                      Exercises      General Comments General comments (skin integrity, edema, etc.): reinforced pursed lip breathing with patient secondary to DOE with ambulation      Pertinent Vitals/Pain Pain Assessment: Faces Faces Pain Scale: Hurts little more Pain Location: hip Pain Descriptors / Indicators: Sore Pain Intervention(s): Limited activity within patient's tolerance;Monitored during session;Repositioned    Home Living                      Prior Function            PT Goals (current goals can now be found in the care plan section) Acute Rehab PT Goals Patient Stated Goal: to return home PT Goal Formulation: With patient Time For  Goal Achievement: 09/01/15 Potential to Achieve Goals: Good Progress towards PT goals: Progressing toward goals    Frequency  Min 3X/week    PT Plan Current plan remains appropriate    Co-evaluation             End of Session Equipment Utilized During Treatment: Gait belt Activity Tolerance: Patient tolerated treatment  well Patient left: in bed;with call bell/phone within reach (sitting EOB.)     Time: [id number] PT Time Calculation (min) (ACUTE ONLY): 16 min  Charges:  $Gait Training: 8-22 mins                    G CodesDuncan Cole 2015/09/12, 11:40 AM Bruce Cole, PT DPT  [phone]

## 2015-08-24 NOTE — Care Management Important Message (Signed)
Important Message  Patient Details  Name: Bruce Cole MRN: [id number] Date of Birth: 05/28/1941   Medicare Important Message Given:  Yes    Louanne Belton 08/24/2015, 12:33 [place] Message  Patient Details  Name: Bruce Cole MRN: [id number] Date of Birth: [birth year]   Medicare Important Message Given:  Yes    Cathan Gearin, Neal Dy 08/24/2015, 12:33 PM

## 2015-08-24 NOTE — Progress Notes (Signed)
[facility] Progress Note    Assessment/ Plan:   1. Acute on chronic kidney disease stage IV: Patient with h/o CKD from underlying hypertensive nephrosclerosis, NSAID nephropathy, and chronic cardiorenal syndrome presenting with worsening renal function in the setting of an acute CHF exacerbation.  FEurea is 12.95%, suggestive of a pre-renal azotemia. UOP mild improvement today (1.5L out, net -515cc in the last 24hrs) with a 1 pound weight loss. No need for emergent HD, however BUN continues to be elevated at 105 despite SCr dropping from 3.83>3.44.  Metolazone discontinued 08/21/15 given rise in BUN/Creatinine. Lasix transitioned to 80mg  PO TID. Vein mapping completed - awaiting Vascular surgery access placement, most likely a left brachial cephalic AV fistula. Coumadin stopped, heparin started for possible fistula placement on Wednesday if INR improves. If no improvement in SCr over the next 24-48 hours, consider the placement of a temporary TDC in the event he requires dialysis prior to maturation of the AVF.   2. Acute on chronic combined systolic and diastolic CHF exacerbation: Likely contributing on #1. Repeat echo this hospitalization with a slightly decreased EF to 40-45%. Advanced heart failure team following. Continue Lasix- cardiology increased to 80mg  PO TID.   Continue coreg, not on ACE inhibitor due to worsening kidney function in the past.    3. Normocytic Anemia: Hgb appears stable this admission.  No evidence of active bleeding. Noted to be iron deficient on recent labwork. Continue IV iron.   4. Atrial fibrillation: currently rate controlled, currently bradycardic. Transitioned from coumadin to heparin for potential surgery on Wednesday. No evidence of active bleeding.  Will need INR normalized prior to vascular surgery access placement  5. Hypertension: BPs generally at goal, sometimes low to 100s/60s.  Continue with current regimen per primary team. If patient  requires HD, would suggest backing off antihypertensives.   6. Gout: No active flare currently. On allopurinol. Renally dose colchine if patient has an acute flare in the future.  Subjective:   Patient doing well. Denies chest pain, cough, or SOB.      Objective:   BP 125/63 mmHg  Pulse 60  Temp(Src) 98.1 F (36.7 C) (Oral)  Resp 18  Ht 6' (1.829 m)  Wt 283 lb 6.4 oz (128.549 kg)  BMI 38.43 kg/m2  SpO2 95%  Intake/Output Summary (Last 24 hours) at 08/24/15 [id number] Last data filed at 08/24/15 0502  Gross per 24 hour  Intake   1060 ml  Output   1575 ml  Net   -515 ml   Weight change: -1 lb (-0.454 kg)  Physical Exam: General: Sitting up on the edge of the bed comfortably eating breakfast. Non-toxic Cardiovascular: Bradycardic, regular rhythm. II/VI systolic murmur. No rubs, or gallops noted.  Respiratory: No increased WOB. CTAB without wheezing, rhonchi, or crackles noted.  Abdomen: +BS, soft, non-distended, non-tender.  Ext: 1-2+ pitting edema most prominent in ankles. Compression stockings in place.    Imaging: No results found.  Labs: BMET  Recent Labs Lab 08/18/15 0413 08/19/15 0348 08/20/15 0500 08/21/15 0400 08/22/15 0859 08/23/15 0404 08/24/15 0433  NA 143 140 138 140 138 139 138  K 3.8 3.7 3.6 3.7 3.2* 3.8 3.0*  CL 103 99* 101 98* 94* 98* 94*  CO2 27 28 24 31 28 28 30   GLUCOSE 92 111* 106* 116* 128* 103* 103*  BUN 91* 91* 96* 100* 102* 105* 105*  CREATININE 3.70* 3.39* 3.49* 3.89* 3.67* 3.83* 3.44*  CALCIUM 10.0 9.8 9.7 9.5 9.8 9.5 9.6  PHOS  --   --   --   --  4.0 3.7 3.1   CBC  Recent Labs Lab 08/17/15 1535 08/18/15 0413 08/19/15 0348 08/20/15 0500 08/21/15 0400  WBC 6.1 5.5 5.6 6.1 5.3  NEUTROABS 4.8 3.9  --   --   --   HGB 9.2* 9.2* 9.1* 9.5* 9.2*  HCT 28.7* 28.9* 28.3* 28.4* 28.4*  MCV 89.1 88.7 89.6 88.5 89.3  PLT 159 162 155 130* 131*    Medications:    . allopurinol  200 mg Oral Daily  . amLODipine  10 mg Oral Daily  .  carvedilol  3.125 mg Oral BID  . darbepoetin (ARANESP) injection - NON-DIALYSIS  60 mcg Subcutaneous Q Fri-1800  . furosemide  80 mg Oral TID  . guaiFENesin  600 mg Oral BID  . hydrALAZINE  50 mg Oral BID  . hydroxychloroquine  200 mg Oral BID  . isosorbide mononitrate  30 mg Oral Daily  . multivitamin with minerals  1 tablet Oral Daily  . polyethylene glycol  17 g Oral Daily  . sodium chloride flush  3 mL Intravenous Q12H  . terazosin  1 mg Oral QHS    Archie Patten, MD  PGY-2,  [facility] 08/24/2015 7:22 AM I have seen and examined this patient and agree with plan Dr Lorenso Courier.  BP lowish and I don't think he needs all his BP meds so will DC amlodipine.  ? If some of his peripheral edema is due to amlodipine.  To go for access placement Wed?  K low, on replacement. Marcelo Ickes T,MD 08/24/2015 10:09 AM

## 2015-08-25 ENCOUNTER — Encounter (HOSPITAL_COMMUNITY): Payer: Medicare Other | Admitting: Internal Medicine

## 2015-08-25 DIAGNOSIS — N185 Chronic kidney disease, stage 5: Secondary | ICD-10-CM

## 2015-08-25 LAB — RENAL FUNCTION PANEL
ALBUMIN: 3.8 g/dL (ref 3.5–5.0)
Anion gap: 13 (ref 5–15)
BUN: 108 mg/dL — AB (ref 6–20)
CALCIUM: 9.6 mg/dL (ref 8.9–10.3)
CHLORIDE: 96 mmol/L — AB (ref 101–111)
CO2: 29 mmol/L (ref 22–32)
CREATININE: 3.21 mg/dL — AB (ref 0.61–1.24)
GFR, EST AFRICAN AMERICAN: 21 mL/min — AB (ref >60)
GFR, EST NON AFRICAN AMERICAN: 18 mL/min — AB (ref >60)
Glucose, Bld: 106 mg/dL — ABNORMAL HIGH (ref 65–99)
PHOSPHORUS: 2.7 mg/dL (ref 2.5–4.6)
Potassium: 3.4 mmol/L — ABNORMAL LOW (ref 3.5–5.1)
SODIUM: 138 mmol/L (ref 135–145)

## 2015-08-25 LAB — PROTIME-INR
INR: 1.87 — AB (ref 0.00–1.49)
PROTHROMBIN TIME: 21.4 s — AB (ref 11.6–15.2)

## 2015-08-25 LAB — HEPARIN LEVEL (UNFRACTIONATED)
HEPARIN UNFRACTIONATED: 0.12 [IU]/mL — AB (ref 0.30–0.70)
HEPARIN UNFRACTIONATED: 0.26 [IU]/mL — AB (ref 0.30–0.70)

## 2015-08-25 MED ORDER — DEXTROSE 5 % IV SOLN
1.5000 g | INTRAVENOUS | Status: AC
  Filled 2015-08-25 (×2): qty 1.5

## 2015-08-25 MED ORDER — HEPARIN (PORCINE) IN NACL 100-0.45 UNIT/ML-% IJ SOLN
1650.0000 [IU]/h | INTRAMUSCULAR | Status: DC
  Administered 2015-08-25: 1200 [IU]/h via INTRAVENOUS
  Filled 2015-08-25 (×2): qty 250

## 2015-08-25 MED ORDER — POTASSIUM CHLORIDE CRYS ER 20 MEQ PO TBCR
40.0000 meq | EXTENDED_RELEASE_TABLET | Freq: Once | ORAL | Status: AC
  Administered 2015-08-25: 40 meq via ORAL
  Filled 2015-08-25: qty 2

## 2015-08-25 MED ORDER — VITAMIN K1 10 MG/ML IJ SOLN
5.0000 mg | Freq: Once | INTRAMUSCULAR | Status: AC
  Administered 2015-08-25: 5 mg via INTRAVENOUS
  Filled 2015-08-25: qty 0.5

## 2015-08-25 NOTE — Progress Notes (Signed)
ANTICOAGULATION CONSULT NOTE - Follow Up Consult  Pharmacy Consult for Heparin (while warfarin on hold and INR <2) Indication: atrial fibrillation  Allergies  Allergen Reactions  . Ace Inhibitors Other (See Comments)    Worsening renal insufficiency    Patient Measurements: Height: 6' (182.9 cm) Weight: 280 lb 12.8 oz (127.37 kg) (scale b) IBW/kg (Calculated) : 77.6  Vital Signs: Temp: 98 F (36.7 C) (02/07 0506) Temp Source: Oral (02/07 0506) BP: 128/60 mmHg (02/07 0506) Pulse Rate: 53 (02/07 0506)  Labs:  Recent Labs  08/23/15 0404  08/23/15 2147 08/24/15 0433 08/25/15 0352 08/25/15 0356  LABPROT  --   < > 25.7* 25.1* 21.4*  --   INR  --   < > 2.37* 2.30* 1.87*  --   CREATININE 3.83*  --   --  3.44*  --  3.21*  < > = values in this interval not displayed.  Estimated Creatinine Clearance: 28.7 mL/min (by C-G formula based on Cr of 3.21).   Assessment: INR <2 this AM, starting heparin in anticipation of fistula creation  Goal of Therapy:  Heparin level 0.3-0.7 units/ml Monitor platelets by anticoagulation protocol: Yes   Plan:  -Start heparin drip at 1200 units/hr -1500 HL -Daily CBC/HL -Monitor for bleeding  Narda Bonds 08/25/2015,6:27 AM

## 2015-08-25 NOTE — Consult Note (Signed)
[facility]   Subjective  - feels ok   Objective 128/60 53 98 F (36.7 C) (Oral) 16 98%  Intake/Output Summary (Last 24 hours) at 08/25/15 [id number] Last data filed at 08/25/15 0743  Gross per 24 hour  Intake    820 ml  Output   1900 ml  Net  -1080 ml   Upper extremity exam unchanged  Assessment/Planning: INR starting to trend down but still over 1.8 today despite no coumadin for the last 3 days Will give 5 mg Vitamin K today Plan for left arm AV fistula tomorrow by Dr Donnetta Hutching.   Dr Mercy Moore will call today if he wishes Palindrome catheter as well  Ruta Hinds 08/25/2015 9:28 AM --  Laboratory Lab Results: No results for input(s): WBC, HGB, HCT, PLT in the last 72 hours. BMET  Recent Labs  08/24/15 0433 08/25/15 0356  NA 138 138  K 3.0* 3.4*  CL 94* 96*  CO2 30 29  GLUCOSE 103* 106*  BUN 105* 108*  CREATININE 3.44* 3.21*  CALCIUM 9.6 9.6    COAG Lab Results  Component Value Date   INR 1.87* 08/25/2015   INR 2.30* 08/24/2015   INR 2.37* 08/23/2015   No results found for: PTT

## 2015-08-25 NOTE — Progress Notes (Signed)
ANTICOAGULATION CONSULT NOTE - Follow Up Consult  Pharmacy Consult for Heparin (while warfarin on hold and INR <2) Indication: atrial fibrillation  Allergies  Allergen Reactions  . Ace Inhibitors Other (See Comments)    Worsening renal insufficiency    Patient Measurements: Height: 6' (182.9 cm) Weight: 280 lb 12.8 oz (127.37 kg) (scale b) IBW/kg (Calculated) : 77.6  Vital Signs: Temp: 98.6 F (37 C) (02/07 1234) Temp Source: Oral (02/07 1234) BP: 105/59 mmHg (02/07 1234) Pulse Rate: 49 (02/07 1234)  Labs:  Recent Labs  08/23/15 0404  08/23/15 2147 08/24/15 0433 08/25/15 0352 08/25/15 0356 08/25/15 1426  LABPROT  --   < > 25.7* 25.1* 21.4*  --   --   INR  --   < > 2.37* 2.30* 1.87*  --   --   HEPARINUNFRC  --   --   --   --   --   --  0.12*  CREATININE 3.83*  --   --  3.44*  --  3.21*  --   < > = values in this interval not displayed.  Estimated Creatinine Clearance: 28.7 mL/min (by C-G formula based on Cr of 3.21).   Assessment: 89yom admited with HF volume overload and renal dysfunction.  Plan for AVG placed 2/8.  He is on warfarin PTA for Afib.  Warfarin on hold for graft placement.   INR <2 this AM, heparin started 1200 unitshr HL 0.12 < goal.  No bleeding Vit K 5mg  IV given today anticipate INR to continue to fall.  Goal of Therapy:  Heparin level 0.3-0.7 units/ml Monitor platelets by anticoagulation protocol: Yes   Plan:  Increase heparin drip at 1400 units/hr Check HL in 6hr  -Daily CBC/HL -Monitor for bleeding  Bonnita Nasuti Pharm.D. CPP, BCPS Clinical Pharmacist [phone] 08/25/2015 3:48 PM

## 2015-08-25 NOTE — Progress Notes (Signed)
[facility] PROGRESS NOTE   B[facility] [id number] DOB: 08-17-1940 DOA: 08/17/2015 PCP: Nance Pear., NP  HPI/Subjective: Feels okay, denies any complaints. Still have some ankle swelling. INR is 1.87, received 5 mg of Coumadin today, check INR in the morning  Assessment/Plan: Principal Problem:   Acute on chronic combined systolic (congestive) and diastolic (congestive) heart failure (HCC) Active Problems:   Essential hypertension   ATRIAL FIBRILLATION   Diabetes mellitus type 2, controlled, with complications ([place])   Long term (current) use of anticoagulants   CKD (chronic kidney disease), stage IV (HCC)   Acute renal failure superimposed on stage 3 chronic kidney disease (HCC)   Pleural effusion on right   S/P thoracentesis   Bradycardia   Chronic atrial fibrillation (HCC)   Acute on chronic congestive heart failure (HCC)   AKI (acute kidney injury) ([place])   Renal failure   Acute on chronic combined systolic and diastolic CHF exacerbation ([place]):  -Patient with increased weight gain not relieved by po Lasix. Patient with +2 pitting edema lower extremity.  -BNP elevated at 204.1 on admission. Last EF 45-50% showing combined systolic and diastolic dysfunction back in 03/2013. -Consult cardiology patient is seen by Dr. Haroldine Laws -Echocardiogram in a.m. -Follow intake/output, daily weight, elevate legs, salt and fluid restriction. -Lasix switched to oral, Lasix 80 mg 3 times a day.  Acute renal failure on chronic kidney disease stage IV -Patient's kidney function has progressively worsened over the last 2 weeks from 2.59 on 1/17 to 3.64 on admission. -Renal ultrasound showed medical renal disease. -Dialysis catheter to be placed on Wednesday, consider TDC.  Pleural effusion -Right-sided pleural effusion status post removal of 1.5 L. -Likely effusion secondary to CHF.  Diabetes mellitus type 2 -Controlled diabetes mellitus type 2, hemoglobin A1c is  6.7 on 08/04/15. -Carbohydrate modified diet and insulin sliding scale while was in the hospital.  Atrial fibrillation on Coumadin -INR therapeutic at 2.65 in patient appears to be rate controlled at this time. CHA2DS2-VASc score 4. -Coumadin stopped, will be on IV heparin anticipating fistula creation.  Hypertension -Continue amlodipine, Coreg, and other blood pressure medications as listed above  Question of acute urinary retention This is resolved   Code Status: Full Code Family Communication: Plan discussed with the patient. Disposition Plan: Remains inpatient Diet: Diet Heart Room service appropriate?: Yes; Fluid consistency:: Thin; Fluid restriction:: 1200 mL Fluid Diet NPO time specified Except for: Sips with Meds  Consultants:  Heart failure team  Procedures:  None  Antibiotics:  None   Objective: Filed Vitals:   08/25/15 1037 08/25/15 1234  BP: 122/68 105/59  Pulse:  49  Temp:  98.6 F (37 C)  Resp:  16    Intake/Output Summary (Last 24 hours) at 08/25/15 1341 Last data filed at 08/25/15 1234  Gross per 24 hour  Intake    820 ml  Output   2150 ml  Net  -1330 ml   Filed Weights   08/23/15 0421 08/24/15 0459 08/25/15 0506  Weight: 129.003 kg (284 lb 6.4 oz) 128.549 kg (283 lb 6.4 oz) 127.37 kg (280 lb 12.8 oz)    Exam: General: Alert and awake, oriented x3, not in any acute distress. HEENT: anicteric sclera, pupils reactive to light and accommodation, EOMI CVS: S1-S2 clear, no murmur rubs or gallops Chest: clear to auscultation bilaterally, no wheezing, rales or rhonchi Abdomen: soft nontender, nondistended, normal bowel sounds, no organomegaly Extremities: no cyanosis, has +2 edema noted bilaterally Neuro: Cranial nerves II-XII intact, no focal  neurological deficits  Data Reviewed: Basic Metabolic Panel:  Recent Labs Lab 08/21/15 0400 08/22/15 0859 08/23/15 0404 08/24/15 0433 08/25/15 0356  NA 140 138 139 138 138  K 3.7 3.2* 3.8 3.0*  3.4*  CL 98* 94* 98* 94* 96*  CO2 31 28 28 30 29   GLUCOSE 116* 128* 103* 103* 106*  BUN 100* 102* 105* 105* 108*  CREATININE 3.89* 3.67* 3.83* 3.44* 3.21*  CALCIUM 9.5 9.8 9.5 9.6 9.6  PHOS  --  4.0 3.7 3.1 2.7   Liver Function Tests:  Recent Labs Lab 08/19/15 0348 08/20/15 0500 08/21/15 0400 08/22/15 0859 08/23/15 0404 08/24/15 0433 08/25/15 0356  AST 34 36 34  --   --   --   --   ALT 17 17 17   --   --   --   --   ALKPHOS 103 99 102  --   --   --   --   BILITOT 0.8 0.7 0.8  --   --   --   --   PROT 7.3 6.9 7.3  --   --   --   --   ALBUMIN 4.0 3.8 3.6 3.7 3.8 3.8 3.8   No results for input(s): LIPASE, AMYLASE in the last 168 hours. No results for input(s): AMMONIA in the last 168 hours. CBC:  Recent Labs Lab 08/19/15 0348 08/20/15 0500 08/21/15 0400  WBC 5.6 6.1 5.3  HGB 9.1* 9.5* 9.2*  HCT 28.3* 28.4* 28.4*  MCV 89.6 88.5 89.3  PLT 155 130* 131*   Cardiac Enzymes: No results for input(s): CKTOTAL, CKMB, CKMBINDEX, TROPONINI in the last 168 hours. BNP (last 3 results)  Recent Labs  08/12/15 1930 08/16/15 2350 08/17/15 1535  BNP 178.6* 174.2* 204.1*    ProBNP (last 3 results) No results for input(s): PROBNP in the last 8760 hours.  CBG: No results for input(s): GLUCAP in the last 168 hours.  Micro Recent Results (from the past 240 hour(s))  Culture, body fluid-bottle     Status: None   Collection Time: 08/19/15  1:45 PM  Result Value Ref Range Status   Specimen Description FLUID RIGHT PLEURAL  Final   Special Requests BOTTLES DRAWN AEROBIC AND ANAEROBIC 10CCS  Final   Culture NO GROWTH 5 DAYS  Final   Report Status 08/24/2015 FINAL  Final  Gram stain     Status: None   Collection Time: 08/19/15  1:45 PM  Result Value Ref Range Status   Specimen Description FLUID RIGHT PLEURAL  Final   Special Requests NONE  Final   Gram Stain   Final    FEW WBC PRESENT, PREDOMINANTLY PMN NO ORGANISMS SEEN    Report Status 08/19/2015 FINAL  Final      Studies: No results found.  Scheduled Meds: . allopurinol  200 mg Oral Daily  . carvedilol  3.125 mg Oral BID  . [START ON 08/26/2015] cefUROXime (ZINACEF)  IV  1.5 g Intravenous To SS-Surg  . darbepoetin (ARANESP) injection - NON-DIALYSIS  60 mcg Subcutaneous Q Fri-1800  . furosemide  80 mg Oral TID  . guaiFENesin  600 mg Oral BID  . hydrALAZINE  50 mg Oral BID  . hydroxychloroquine  200 mg Oral BID  . isosorbide mononitrate  30 mg Oral Daily  . multivitamin with minerals  1 tablet Oral Daily  . polyethylene glycol  17 g Oral Daily  . sodium chloride flush  3 mL Intravenous Q12H  . terazosin  1 mg Oral QHS  Continuous Infusions: . heparin 1,200 Units/hr (08/25/15 0802)       Time spent: 35 minutes    [facility] A  [facility] Pager [phone] If 7PM-7AM, please contact night-coverage at [email or web address], password [facility] 08/25/2015, 1:41 PM  LOS: 8 days

## 2015-08-25 NOTE — Care Management Note (Signed)
Case Management Note  Patient Details  Name: Bruce Cole MRN: [name] Date of Birth: 01/11/1941  Subjective/Objective:   Admitted with CHF                 Action/Plan: Patient lives alone, ex Football player. Has a stationary bike at home that he use at times, scales - he weighs himself daily. Patient could benefit from a Disease Management program for CHF, HHC choice offered, patient chose [facility], Mary with [facility] called for arrangements. Also patient is agreeable to the CHF program with [facility] program.  Expected Discharge Date:    possibly 08/27/2015              Expected Discharge Plan:  [place]  Discharge planning Services  CM Consult    Choice offered to:  Patient  HH Arranged:  RN, PT [facility] Agency:  [facility]  Status of Service:  In process, will continue to follow  Medicare Important Message Given:  Yes  Sherrilyn Rist [phone] 08/25/2015, 1:42 PM

## 2015-08-25 NOTE — Progress Notes (Signed)
ANTICOAGULATION CONSULT NOTE - Follow Up Consult  Pharmacy Consult for Heparin (while warfarin on hold and INR <2) Indication: atrial fibrillation  Allergies  Allergen Reactions  . Ace Inhibitors Other (See Comments)    Worsening renal insufficiency    Patient Measurements: Height: 6' (182.9 cm) Weight: 280 lb 12.8 oz (127.37 kg) (scale b) IBW/kg (Calculated) : 77.6  Heparin dosing wt: 106 kg  Vital Signs: Temp: 98.1 F (36.7 C) (02/07 2120) Temp Source: Oral (02/07 2120) BP: 98/49 mmHg (02/07 2120) Pulse Rate: 48 (02/07 2120)  Labs:  Recent Labs  08/23/15 0404  08/23/15 2147 08/24/15 0433 08/25/15 0352 08/25/15 0356 08/25/15 1426 08/25/15 2300  LABPROT  --   < > 25.7* 25.1* 21.4*  --   --   --   INR  --   < > 2.37* 2.30* 1.87*  --   --   --   HEPARINUNFRC  --   --   --   --   --   --  0.12* 0.26*  CREATININE 3.83*  --   --  3.44*  --  3.21*  --   --   < > = values in this interval not displayed.  Estimated Creatinine Clearance: 28.7 mL/min (by C-G formula based on Cr of 3.21).   Assessment: 59yom admited with HF volume overload and renal dysfunction.  Plan for AVG placement 2/8.  He is on warfarin PTA for Afib.  Warfarin on hold for graft placement. Bridging with heparin while INR <2. Heparin level remains slightly subtherapeutic on 1400 units/hr. No issues with line or bleeding reported per RN.  Goal of Therapy:  Heparin level 0.3-0.7 units/ml Monitor platelets by anticoagulation protocol: Yes   Plan:  Increase heparin drip to 1650 units/hr Check heparin level in 8 hr   Sherlon Handing, PharmD, BCPS Clinical pharmacist, pager [phone] 08/25/2015 11:56 PM

## 2015-08-25 NOTE — Progress Notes (Signed)
[facility] Progress Note    Assessment/ Plan:   1. Acute on chronic kidney disease stage IV: Patient with h/o CKD from underlying hypertensive nephrosclerosis, NSAID nephropathy, and chronic cardiorenal syndrome presenting with worsening renal function in the setting of an acute CHF exacerbation.  FEurea is 12.95%, suggestive of a pre-renal azotemia. UOP mild improvement today (1.9L out, net -1L in the last 24hrs) with a 3 pound weight loss. No need for emergent HD, however BUN continues to be elevated at 108 despite SCr improving from 3.83>3.44>3.21.  Metolazone discontinued 08/21/15 given rise in BUN/Creatinine. Vein mapping completed - awaiting vascular surgery access placement, most likely a left brachial cephalic AV fistula. Coumadin stopped, heparin started for possible fistula placement on Wednesday if INR improves. If no improvement in SCr/BUN over the next 24 hours, consider the placement of a temporary TDC in the event he requires dialysis prior to maturation of the AVF. Continue Lasix 80mg  PO TID.  2. Acute on chronic combined systolic and diastolic CHF exacerbation: Likely contributing on #1. Repeat echo this hospitalization with a slightly decreased EF to 40-45%. Advanced heart failure team signed off. Continue Lasix 80mg  PO TID.   Continue coreg, not on ACE inhibitor due to worsening kidney function in the past.    3. Normocytic Anemia: Hgb appears stable this admission.  No evidence of active bleeding. Noted to be iron deficient on recent labwork. Received Feraheme on 2/2, continue Aranesp weekly.   4. Atrial fibrillation: currently rate controlled. Transitioned from coumadin to heparin for potential surgery on Wednesday. No evidence of active bleeding.  Will need INR normalized prior to vascular surgery access placement  5. Hypertension: BPs improved and still at goal with discontinuation of amlodipine.  Continue with current regimen per primary team.   6. Gout: No  active flare currently. On allopurinol. Renally dose colchine if patient has an acute flare in the future.  7. Hypokalemia: K down to 3.4, most likely in the setting of diuresis with Lasix. Replete with KDUR 66mEQ x 1 dose.   Subjective:   Patient doing well. Denies chest pain, cough, or SOB. Feels his LE swelling has improved.       Objective:   BP 128/60 mmHg  Pulse 53  Temp(Src) 98 F (36.7 C) (Oral)  Resp 16  Ht 6' (1.829 m)  Wt 280 lb 12.8 oz (127.37 kg)  BMI 38.07 kg/m2  SpO2 98%  Intake/Output Summary (Last 24 hours) at 08/25/15 0737 Last data filed at 08/25/15 0505  Gross per 24 hour  Intake    940 ml  Output   1950 ml  Net  -1010 ml   Weight change: -2 lb 9.6 oz (-1.179 kg)  Physical Exam: General: Sitting up on the edge of the bed comfortably. Non-toxic Cardiovascular: Regular rate and rhythm. II/VI systolic murmur. No rubs, or gallops noted. JVD at 8cm.  Respiratory: No increased WOB. CTAB without wheezing, rhonchi, or crackles noted.  Abdomen: +BS, soft, non-distended, non-tender.  Ext: 1-2+ pitting edema most prominent in ankles L>R.   Imaging: No results found.  Labs: BMET  Recent Labs Lab 08/19/15 0348 08/20/15 0500 08/21/15 0400 08/22/15 0859 08/23/15 0404 08/24/15 0433 08/25/15 0356  NA 140 138 140 138 139 138 138  K 3.7 3.6 3.7 3.2* 3.8 3.0* 3.4*  CL 99* 101 98* 94* 98* 94* 96*  CO2 28 24 31 28 28 30 29   GLUCOSE 111* 106* 116* 128* 103* 103* 106*  BUN 91* 96* 100* 102* 105*  105* 108*  CREATININE 3.39* 3.49* 3.89* 3.67* 3.83* 3.44* 3.21*  CALCIUM 9.8 9.7 9.5 9.8 9.5 9.6 9.6  PHOS  --   --   --  4.0 3.7 3.1 2.7   CBC  Recent Labs Lab 08/19/15 0348 08/20/15 0500 08/21/15 0400  WBC 5.6 6.1 5.3  HGB 9.1* 9.5* 9.2*  HCT 28.3* 28.4* 28.4*  MCV 89.6 88.5 89.3  PLT 155 130* 131*    Medications:    . allopurinol  200 mg Oral Daily  . carvedilol  3.125 mg Oral BID  . darbepoetin (ARANESP) injection - NON-DIALYSIS  60 mcg Subcutaneous  Q Fri-1800  . furosemide  80 mg Oral TID  . guaiFENesin  600 mg Oral BID  . hydrALAZINE  50 mg Oral BID  . hydroxychloroquine  200 mg Oral BID  . isosorbide mononitrate  30 mg Oral Daily  . multivitamin with minerals  1 tablet Oral Daily  . polyethylene glycol  17 g Oral Daily  . sodium chloride flush  3 mL Intravenous Q12H  . terazosin  1 mg Oral QHS    Archie Patten, MD  PGY-2,  [facility] 08/25/2015 7:37 AM I have seen and examined this patient and agree with plan Dr Lorenso Courier.  UO good, Scr trending down.  No need for HD now so will just have AVF placed in AM.  Discussed with pt and he agrees.  For access in AM then ? DC.  Discussed the need to adjust lasix based on edema.   K sl low, Will replace.[name] Satine Hausner T,MD 08/25/2015 10:36 AM

## 2015-08-26 ENCOUNTER — Encounter (HOSPITAL_COMMUNITY): Payer: Self-pay | Admitting: Anesthesiology

## 2015-08-26 ENCOUNTER — Inpatient Hospital Stay (HOSPITAL_COMMUNITY): Payer: Medicare Other | Admitting: Anesthesiology

## 2015-08-26 ENCOUNTER — Other Ambulatory Visit: Payer: Self-pay | Admitting: *Deleted

## 2015-08-26 ENCOUNTER — Encounter (HOSPITAL_COMMUNITY)
Admission: EM | Disposition: A | Discharge status: Home Health | Payer: Self-pay | Source: Home / Self Care | Attending: Internal Medicine

## 2015-08-26 DIAGNOSIS — Z4931 Encounter for adequacy testing for hemodialysis: Secondary | ICD-10-CM

## 2015-08-26 DIAGNOSIS — E118 Type 2 diabetes mellitus with unspecified complications: Secondary | ICD-10-CM

## 2015-08-26 DIAGNOSIS — Z7901 Long term (current) use of anticoagulants: Secondary | ICD-10-CM

## 2015-08-26 DIAGNOSIS — N186 End stage renal disease: Secondary | ICD-10-CM

## 2015-08-26 DIAGNOSIS — I10 Essential (primary) hypertension: Secondary | ICD-10-CM

## 2015-08-26 HISTORY — PX: AV FISTULA PLACEMENT: SHX1204

## 2015-08-26 LAB — CBC
HCT: 29.4 % — ABNORMAL LOW (ref 39.0–52.0)
Hemoglobin: 9.7 g/dL — ABNORMAL LOW (ref 13.0–17.0)
MCH: 29.9 pg (ref 26.0–34.0)
MCHC: 33 g/dL (ref 30.0–36.0)
MCV: 90.7 fL (ref 78.0–100.0)
PLATELETS: 166 10*3/uL (ref 150–400)
RBC: 3.24 MIL/uL — AB (ref 4.22–5.81)
RDW: 18.1 % — AB (ref 11.5–15.5)
WBC: 7.1 10*3/uL (ref 4.0–10.5)

## 2015-08-26 LAB — RENAL FUNCTION PANEL
ALBUMIN: 3.9 g/dL (ref 3.5–5.0)
ANION GAP: 13 (ref 5–15)
BUN: 96 mg/dL — AB (ref 6–20)
CALCIUM: 9.9 mg/dL (ref 8.9–10.3)
CO2: 31 mmol/L (ref 22–32)
Chloride: 97 mmol/L — ABNORMAL LOW (ref 101–111)
Creatinine, Ser: 2.82 mg/dL — ABNORMAL HIGH (ref 0.61–1.24)
GFR calc Af Amer: 24 mL/min — ABNORMAL LOW (ref >60)
GFR, EST NON AFRICAN AMERICAN: 21 mL/min — AB (ref >60)
Glucose, Bld: 98 mg/dL (ref 65–99)
PHOSPHORUS: 2.4 mg/dL — AB (ref 2.5–4.6)
POTASSIUM: 3.2 mmol/L — AB (ref 3.5–5.1)
SODIUM: 141 mmol/L (ref 135–145)

## 2015-08-26 LAB — PROTIME-INR
INR: 1.4 (ref 0.00–1.49)
PROTHROMBIN TIME: 17.3 s — AB (ref 11.6–15.2)

## 2015-08-26 SURGERY — ARTERIOVENOUS (AV) FISTULA CREATION
Anesthesia: General | Site: Arm Upper | Laterality: Left

## 2015-08-26 MED ORDER — FENTANYL CITRATE (PF) 100 MCG/2ML IJ SOLN: 25.0000 ug | INTRAMUSCULAR | Status: DC | PRN

## 2015-08-26 MED ORDER — OXYCODONE HCL 5 MG PO TABS
10.0000 mg | ORAL_TABLET | ORAL | Status: AC | PRN
  Administered 2015-08-26 – 2015-08-28 (×3): 10 mg via ORAL
  Filled 2015-08-26 (×3): qty 2

## 2015-08-26 MED ORDER — FENTANYL CITRATE (PF) 250 MCG/5ML IJ SOLN
INTRAMUSCULAR | Status: AC
  Filled 2015-08-26: qty 5

## 2015-08-26 MED ORDER — OXYCODONE HCL 5 MG/5ML PO SOLN: 5.0000 mg | Freq: Once | ORAL | Status: AC | PRN

## 2015-08-26 MED ORDER — POTASSIUM CHLORIDE CRYS ER 20 MEQ PO TBCR
40.0000 meq | EXTENDED_RELEASE_TABLET | Freq: Two times a day (BID) | ORAL | Status: AC
Start: 2015-08-26 — End: 2015-08-27
  Administered 2015-08-26: 40 meq via ORAL
  Filled 2015-08-26: qty 2

## 2015-08-26 MED ORDER — SODIUM CHLORIDE 0.9 % IV SOLN
INTRAVENOUS | Status: DC | PRN
  Administered 2015-08-26: 11:00:00

## 2015-08-26 MED ORDER — PROPOFOL 10 MG/ML IV BOLUS
INTRAVENOUS | Status: AC
  Filled 2015-08-26: qty 20

## 2015-08-26 MED ORDER — ONDANSETRON HCL 4 MG/2ML IJ SOLN
INTRAMUSCULAR | Status: AC
  Filled 2015-08-26: qty 2

## 2015-08-26 MED ORDER — SODIUM CHLORIDE 0.9 % IV SOLN
INTRAVENOUS | Status: DC
  Administered 2015-08-26: 10:00:00 via INTRAVENOUS

## 2015-08-26 MED ORDER — ONDANSETRON HCL 4 MG/2ML IJ SOLN: 4.0000 mg | Freq: Once | INTRAMUSCULAR | Status: DC | PRN

## 2015-08-26 MED ORDER — FENTANYL CITRATE (PF) 100 MCG/2ML IJ SOLN
INTRAMUSCULAR | Status: DC | PRN
  Administered 2015-08-26: 50 ug via INTRAVENOUS
  Administered 2015-08-26: 25 ug via INTRAVENOUS

## 2015-08-26 MED ORDER — OXYCODONE HCL 5 MG PO TABS
5.0000 mg | ORAL_TABLET | Freq: Once | ORAL | Status: AC | PRN
  Administered 2015-08-26: 5 mg via ORAL

## 2015-08-26 MED ORDER — LIDOCAINE HCL (CARDIAC) 20 MG/ML IV SOLN
INTRAVENOUS | Status: AC
  Filled 2015-08-26: qty 5

## 2015-08-26 MED ORDER — LIDOCAINE-EPINEPHRINE (PF) 1 %-1:200000 IJ SOLN
INTRAMUSCULAR | Status: AC
  Filled 2015-08-26: qty 30

## 2015-08-26 MED ORDER — TRAMADOL HCL 50 MG PO TABS: 50.0000 mg | ORAL_TABLET | Freq: Four times a day (QID) | ORAL | Status: DC | PRN

## 2015-08-26 MED ORDER — FENTANYL CITRATE (PF) 100 MCG/2ML IJ SOLN
INTRAMUSCULAR | Status: AC
  Filled 2015-08-26: qty 2

## 2015-08-26 MED ORDER — LIDOCAINE HCL (CARDIAC) 20 MG/ML IV SOLN
INTRAVENOUS | Status: DC | PRN
  Administered 2015-08-26: 40 mg via INTRAVENOUS

## 2015-08-26 MED ORDER — MIDAZOLAM HCL 2 MG/2ML IJ SOLN
INTRAMUSCULAR | Status: AC
  Filled 2015-08-26: qty 2

## 2015-08-26 MED ORDER — MIDAZOLAM HCL 5 MG/5ML IJ SOLN
INTRAMUSCULAR | Status: DC | PRN
  Administered 2015-08-26: 1 mg via INTRAVENOUS
  Administered 2015-08-26: 0.5 mg via INTRAVENOUS

## 2015-08-26 MED ORDER — 0.9 % SODIUM CHLORIDE (POUR BTL) OPTIME
TOPICAL | Status: DC | PRN
  Administered 2015-08-26: 1000 mL

## 2015-08-26 MED ORDER — HYDRALAZINE HCL 25 MG PO TABS
25.0000 mg | ORAL_TABLET | Freq: Two times a day (BID) | ORAL | Status: DC
  Administered 2015-08-26 – 2015-08-28 (×4): 25 mg via ORAL
  Filled 2015-08-26 (×4): qty 1

## 2015-08-26 MED ORDER — PROPOFOL 10 MG/ML IV BOLUS
INTRAVENOUS | Status: DC | PRN
  Administered 2015-08-26: 150 mg via INTRAVENOUS
  Administered 2015-08-26: 30 mg via INTRAVENOUS
  Administered 2015-08-26: 20 mg via INTRAVENOUS

## 2015-08-26 MED ORDER — HEPARIN (PORCINE) IN NACL 100-0.45 UNIT/ML-% IJ SOLN
1800.0000 [IU]/h | INTRAMUSCULAR | Status: DC
  Administered 2015-08-26: 1650 [IU]/h via INTRAVENOUS
  Administered 2015-08-27 – 2015-08-28 (×2): 1800 [IU]/h via INTRAVENOUS
  Filled 2015-08-26 (×2): qty 250

## 2015-08-26 MED ORDER — OXYCODONE HCL 5 MG PO TABS
ORAL_TABLET | ORAL | Status: AC
  Filled 2015-08-26: qty 1

## 2015-08-26 SURGICAL SUPPLY — 39 items
ARMBAND PINK RESTRICT EXTREMIT (MISCELLANEOUS) ×3 IMPLANT
BENZOIN TINCTURE PRP APPL 2/3 (GAUZE/BANDAGES/DRESSINGS) ×3 IMPLANT
CANISTER SUCTION 2500CC (MISCELLANEOUS) ×3 IMPLANT
CANNULA VESSEL 3MM 2 BLNT TIP (CANNULA) ×3 IMPLANT
CLIP LIGATING EXTRA MED SLVR (CLIP) ×3 IMPLANT
CLIP LIGATING EXTRA SM BLUE (MISCELLANEOUS) ×3 IMPLANT
CLOSURE STERI-STRIP 1/2X4 (GAUZE/BANDAGES/DRESSINGS) ×1
CLOSURE WOUND 1/2 X4 (GAUZE/BANDAGES/DRESSINGS)
CLSR STERI-STRIP ANTIMIC 1/2X4 (GAUZE/BANDAGES/DRESSINGS) ×2 IMPLANT
COVER PROBE W GEL 5X96 (DRAPES) IMPLANT
DECANTER SPIKE VIAL GLASS SM (MISCELLANEOUS) ×3 IMPLANT
ELECT REM PT RETURN 9FT ADLT (ELECTROSURGICAL) ×3
ELECTRODE REM PT RTRN 9FT ADLT (ELECTROSURGICAL) ×1 IMPLANT
GAUZE SPONGE 4X4 12PLY STRL (GAUZE/BANDAGES/DRESSINGS) ×3 IMPLANT
GEL ULTRASOUND 20GR AQUASONIC (MISCELLANEOUS) IMPLANT
GLOVE BIOGEL PI IND STRL 6.5 (GLOVE) ×3 IMPLANT
GLOVE BIOGEL PI INDICATOR 6.5 (GLOVE) ×6
GLOVE ECLIPSE 7.5 STRL STRAW (GLOVE) ×6 IMPLANT
GLOVE SS BIOGEL STRL SZ 6.5 (GLOVE) ×1 IMPLANT
GLOVE SS BIOGEL STRL SZ 7.5 (GLOVE) ×1 IMPLANT
GLOVE SUPERSENSE BIOGEL SZ 6.5 (GLOVE) ×2
GLOVE SUPERSENSE BIOGEL SZ 7.5 (GLOVE) ×2
GOWN STRL REUS W/ TWL LRG LVL3 (GOWN DISPOSABLE) ×2 IMPLANT
GOWN STRL REUS W/TWL LRG LVL3 (GOWN DISPOSABLE) ×4
GOWN STRL REUS W/TWL XL LVL3 (GOWN DISPOSABLE) ×6 IMPLANT
KIT BASIN OR (CUSTOM PROCEDURE TRAY) ×3 IMPLANT
KIT ROOM TURNOVER OR (KITS) ×3 IMPLANT
NS IRRIG 1000ML POUR BTL (IV SOLUTION) ×3 IMPLANT
PACK CV ACCESS (CUSTOM PROCEDURE TRAY) ×3 IMPLANT
PAD ARMBOARD 7.5X6 YLW CONV (MISCELLANEOUS) ×6 IMPLANT
SPONGE GAUZE 4X4 12PLY STER LF (GAUZE/BANDAGES/DRESSINGS) ×3 IMPLANT
STRIP CLOSURE SKIN 1/2X4 (GAUZE/BANDAGES/DRESSINGS) IMPLANT
SUT PROLENE 6 0 CC (SUTURE) ×6 IMPLANT
SUT VIC AB 3-0 SH 27 (SUTURE) ×2
SUT VIC AB 3-0 SH 27X BRD (SUTURE) ×1 IMPLANT
TAPE CLOTH SURG 4X10 WHT LF (GAUZE/BANDAGES/DRESSINGS) ×3 IMPLANT
TOWEL OR 17X24 6PK STRL BLUE (TOWEL DISPOSABLE) ×3 IMPLANT
UNDERPAD 30X30 INCONTINENT (UNDERPADS AND DIAPERS) ×3 IMPLANT
WATER STERILE IRR 1000ML POUR (IV SOLUTION) ×3 IMPLANT

## 2015-08-26 NOTE — Progress Notes (Signed)
[facility]                                                                              Patient Demographics  Bruce Cole, is a 75 y.o. male, DOB - 1940/09/15, [id number]  Admit date - 08/17/2015   Admitting Physician Bruce Morton, MD  Outpatient Primary MD for the patient is Bruce Pear., NP  LOS - 9   Chief Complaint  Patient presents with  . Shortness of Breath      HPI on 08/18/2015 by Bruce Cole Bruce Cole is a 75 year old male with a past medical history significant for HTN, A. fib on Coumadin, CHF, and CKD; who presented with increasing shortness of breath, leg swelling, and dyspnea on exertion over the last week. Patient was seen by his primary care provider today and referred to the emergency department. Patient notes a 7- 8 pound weight gain despite being on Lasix 80 mg twice a day. Patient reports having a cough which is nonproductive. Denies any chest pain, nausea, vomiting, fever, or chills. Bruce Cole also states that he's had difficulty urinating over this last week as well. Review of patient's last echocardiogram back in 03/2013 showed EF of 45-50% with grade 2 diastolic dysfunction. He notes that he's had congestive heart failure for 3-4 years, but had been treated as an outpatient.  Upon admission to the emergency department patient was evaluated and seen have elevated BNP at 204.1. Creatinine function have progressively deteriorated and was noted to be 2.59 on 08/04/2015, and today elevated at 3.64. Patient had been given 30 mg of IV Lasix on the 29th when he went to the emergency department, and then again on the 30th symptoms were not improved prior to being transferred to [facility] for admission.  Assessment & Cole   Acute on chronic combined systolic and diastolic CHF exacerbation ([place]):  -Patient with increased weight gain not relieved by po Lasix. Patient with +2 pitting edema lower extremity.  -BNP elevated at 204.1 on  admission. Last EF 45-50% showing combined systolic and diastolic dysfunction back in 03/2013. -Cardiology consulted and appreciated -Follow intake/output, daily weight, elevate legs, salt and fluid restriction. -Lasix switched to oral, Lasix 80 mg 3 times a day.  Acute renal failure on chronic kidney disease stage IV -Patient's kidney function has progressively worsened over the last 2 weeks from 2.59 on 1/17 to 3.64 on admission. -Creatinine 2.82 today -Renal ultrasound showed medical renal disease. -Nephrology consulted and appreciated -Vascular surgery consulted for Left AVF placed today  Pleural effusion -Right-sided pleural effusion status post removal of 1.5 L. -Likely effusion secondary to CHF.  Diabetes mellitus type 2 -Controlled diabetes mellitus type 2, hemoglobin A1c is 6.7 on 08/04/15. -Continue ISS and CBG monitoring  Atrial fibrillation on Coumadin -CHA2DS2-VASc score 4. -INR 1.4 -Coumadin stopped, will be on IV heparin anticipating fistula creation.  Hypertension -Continue amlodipine, Coreg, and other blood pressure medications as listed above  Question of acute urinary retention -Resolved   Hypokalemia -Will replace and continue to monitor BMP -Likely secondary to diuresis  Code Status: Full   Family Communication: None at bedside  Disposition Cole: Admitted.   Time Spent in minutes  30 minutes  Procedures  Left AVF   Consults   Cardiology Nephrology Vascular surgery  DVT Prophylaxis  heparin  Lab Results  Component Value Date   PLT 166 08/26/2015    Medications  Scheduled Meds: . allopurinol  200 mg Oral Daily  . carvedilol  3.125 mg Oral BID  . cefUROXime (ZINACEF)  IV  1.5 g Intravenous To SS-Surg  . darbepoetin (ARANESP) injection - NON-DIALYSIS  60 mcg Subcutaneous Q Fri-1800  . furosemide  80 mg Oral TID  . guaiFENesin  600 mg Oral BID  . hydrALAZINE  25 mg Oral BID  . hydroxychloroquine  200 mg Oral BID  . isosorbide  mononitrate  30 mg Oral Daily  . multivitamin with minerals  1 tablet Oral Daily  . oxyCODONE      . polyethylene glycol  17 g Oral Daily  . potassium chloride  40 mEq Oral BID  . sodium chloride flush  3 mL Intravenous Q12H  . terazosin  1 mg Oral QHS   Continuous Infusions: . sodium chloride 10 mL/hr at 08/26/15 1021  . heparin     PRN Meds:.sodium chloride, acetaminophen, albuterol, ondansetron (ZOFRAN) IV, oxymetazoline, sodium chloride flush, sorbitol, traMADol, zolpidem  Antibiotics    Anti-infectives    Start     Dose/Rate Route Frequency Ordered Stop   08/26/15 1130  cefUROXime (ZINACEF) 1.5 g in dextrose 5 % 50 mL IVPB     1.5 g 100 mL/hr over 30 Minutes Intravenous To [facility] 08/25/15 0916 08/27/15 1130   08/17/15 2230  hydroxychloroquine (PLAQUENIL) tablet 200 mg     200 mg Oral 2 times daily 08/17/15 2205        Subjective:   Bruce Cole seen and examined today.   Patient denies chest pain, shortness of breath, abdominal pain.   Objective:   Filed Vitals:   08/26/15 1315 08/26/15 1320 08/26/15 1330 08/26/15 1344  BP:  112/73  123/73  Pulse: 58  49 50  Temp:  97.5 F (36.4 C)    TempSrc:      Resp: 21  22 18   Height:      Weight:      SpO2: 93%  91% 95%    Wt Readings from Last 3 Encounters:  08/26/15 127.098 kg (280 lb 3.2 oz)  08/17/15 131.997 kg (291 lb)  08/12/15 127.007 kg (280 lb)     Intake/Output Summary (Last 24 hours) at 08/26/15 1406 Last data filed at 08/26/15 1329  Gross per 24 hour  Intake 1285.57 ml  Output   1950 ml  Net -664.43 ml    Exam  General: Well developed, well nourished, NAD, appears stated age  33: NCAT, mucous membranes moist.   Cardiovascular: S1 S2 auscultated, 2/6 SEM,  Regular rate and rhythm.  Respiratory: Clear to auscultation bilaterally with equal chest rise  Abdomen: Soft, nontender, nondistended, + bowel sounds  Extremities: warm dry without cyanosis clubbing. 2+ edema in LE  B/L  Neuro: AAOx3, nonfocal  Psych: Normal affect and demeanor   Data Review   Micro Results Recent Results (from the past 240 hour(s))  Culture, body fluid-bottle     Status: None   Collection Time: 08/19/15  1:45 PM  Result Value Ref Range Status   Specimen Description FLUID RIGHT PLEURAL  Final   Special Requests BOTTLES DRAWN AEROBIC AND ANAEROBIC 10CCS  Final   Culture NO GROWTH 5 DAYS  Final   Report Status 08/24/2015 FINAL  Final  Gram stain  Status: None   Collection Time: 08/19/15  1:45 PM  Result Value Ref Range Status   Specimen Description FLUID RIGHT PLEURAL  Final   Special Requests NONE  Final   Gram Stain   Final    FEW WBC PRESENT, PREDOMINANTLY PMN NO ORGANISMS SEEN    Report Status 08/19/2015 FINAL  Final    Radiology Reports Dg Chest 1 View  08/19/2015  CLINICAL DATA:  Status post right-sided thoracentesis. EXAM: CHEST 1 VIEW COMPARISON:  August 17, 2015. FINDINGS: Stable cardiomegaly is noted. No pneumothorax or significant pleural effusion is noted. No acute pulmonary disease is noted. Bony thorax is unremarkable. IMPRESSION: Stable cardiomegaly.  No other significant abnormality seen. Electronically Signed   By: Marijo Conception, M.D.   On: 08/19/2015 14:22   Dg Chest 2 View  08/17/2015  CLINICAL DATA:  Shortness of breath for 1 week. EXAM: CHEST  2 VIEW COMPARISON:  08/12/2015. FINDINGS: Development of small right pleural effusion. Associated right basilar opacity. Cardiomegaly is stable. No pulmonary edema or pneumothorax. Osseous structures are unchanged. IMPRESSION: Developed of small right pleural effusion. Associated right basilar opacity may reflect compressive atelectasis. Pneumonia could have a similar appearance in the appropriate clinical setting. Cardiomegaly is stable. Electronically Signed   By: Jeb Levering M.D.   On: 08/17/2015 01:08   Dg Chest 2 View  08/12/2015  CLINICAL DATA:  Shortness of breath for 2 days. Difficulty urinating.  Initial encounter. EXAM: CHEST  2 VIEW COMPARISON:  PA and lateral chest 04/15/2015. FINDINGS: There is marked cardiomegaly without pulmonary edema. No pneumothorax or pleural effusion is identified. IMPRESSION: Cardiomegaly without acute disease. Electronically Signed   By: Inge Rise M.D.   On: 08/12/2015 20:05   Ct Chest Wo Contrast  08/18/2015  CLINICAL DATA:  Follow-up right pleural effusion. History of atrial fibrillation. EXAM: CT CHEST WITHOUT CONTRAST TECHNIQUE: Multidetector CT imaging of the chest was performed following the standard protocol without IV contrast. COMPARISON:  Chest radiograph 08/17/2015 and abdominal CT 05/01/2015 FINDINGS: Mediastinum/Lymph Nodes: No gross abnormality in the visualized thyroid tissue. No significant mediastinal or hilar lymphadenopathy but limited evaluation without intravenous contrast. Enlargement of the mid ascending thoracic aorta measuring up to 4.1 cm. Aneurysm of the proximal descending thoracic aorta measuring up to 4.8 cm. The mid descending thoracic aorta measures 4.0. The distal descending thoracic aorta measures 3.4 cm. Heart size is enlarged. No significant pericardial fluid. Lungs/Pleura: There is a moderate sized right pleural effusion with extensive volume loss throughout the right lower lobe. Minimal aeration in the right lower lobe. Scattered ground-glass densities in the left lower lobe probably represent areas of atelectasis. No clear evidence for pulmonary edema. Upper abdomen: Limited evaluation of the upper abdomen on this non contrast examination. Fullness in left adrenal gland probably related to an adenoma. Probable exophytic cyst involving the left kidney pole Musculoskeletal: Laminectomy changes in lower thoracic spine. Multilevel degenerative disc disease in the thoracic spine. IMPRESSION: Moderate-sized right pleural effusion with near complete collapse of the right lower lobe. Limited evaluation of the right lower lobe due to  volume loss. Cardiomegaly. Aneurysm of the descending thoracic aorta, measuring up to 4.8 cm. Aneurysm of the ascending thoracic aorta measuring up to 4.1 cm. Recommend semi-annual imaging followup by CTA or MRA and referral to cardiothoracic surgery if not already obtained. This recommendation follows 2010 ACCF/AHA/AATS/ACR/ASA/SCA/SCAI/SIR/STS/SVM Guidelines for the Diagnosis and Management of Patients With Thoracic Aortic Disease. Circulation. 2010; 121: e266-e36 Electronically Signed   By: Markus Daft  M.D.   On: 08/18/2015 18:39   US Renal  08/18/2015  CLINICAL DATA:  Acute kidney injury, known chronic renal insufficiency. EXAM: RENAL / URINARY TRACT ULTRASOUND COMPLETE COMPARISON:  Noncontrast CT scan of the abdomen and pelvis dated May 01, 2015 and abdominal ultrasound of April 08, 2013 FINDINGS: Right Kidney: Length: 14.2 cm. The renal cortical echotexture is approximately equal to that of the liver. There is no hydronephrosis. There are multiple simple appearing cortical cysts. The largest cyst measures 2.5 x 1.9 x 2.2 cm. A midpole cyst measures 2.1 x 1.8 x 1.7 cm. Left Kidney: Length: 12.8 cm. The renal cortical echotexture is similar to that on the left. There are multiple cortical cysts. One in the upper pole measures 2.8 x 2.8 x 2.6 cm. A second cyst in the midpole measures 3.1 x 2.5 x 3 cm. There is no hydronephrosis. Bladder: The urinary bladder is decompressed due to postvoid state. There is a moderate-sized right pleural effusion. IMPRESSION: 1. Increased renal cortical echotexture which is been previously demonstrated consistent with medical renal disease. No significant renal cortical atrophy is observed. There are multiple simple appearing cortical cysts bilaterally. There is no hydronephrosis. 2. There is a moderate-sized right pleural effusion. Electronically Signed   By: David  Martinique M.D.   On: 08/18/2015 11:27   Dg Chest 2v Repeat Same Day  08/17/2015  CLINICAL DATA:  Increased  shortness of breath and leg swelling. EXAM: CHEST  2 VIEW COMPARISON:  08/17/2015 and 08/12/2015 and 04/15/2015 FINDINGS: Again noted are right basilar chest densities and suspect elevation of the right hemidiaphragm. There may be compressive atelectasis or airspace disease along the medial right lung base. Left lung remains clear. Heart and mediastinum are stable. Stable mild cardiomegaly. IMPRESSION: Persistent densities at the right lung base, particularly along the medial right lung base. Findings could represent a combination of consolidation and pleural fluid. This represents a significant change from the examination in 2016. Recommend follow-up to ensure improved aeration at the right lung base. If aeration does not improve, recommend further characterization with CT. Electronically Signed   By: Markus Daft M.D.   On: 08/17/2015 16:13   US Thoracentesis Asp Pleural Space W/img Guide  08/19/2015  INDICATION: CHF, chronic kidney disease, dyspnea, right pleural effusion. Request is made for diagnostic and therapeutic right thoracentesis. EXAM: ULTRASOUND GUIDED DIAGNOSTIC AND THERAPEUTIC RIGHT THORACENTESIS MEDICATIONS: None. COMPLICATIONS: None immediate. PROCEDURE: An ultrasound guided thoracentesis was thoroughly discussed with the patient and questions answered. The benefits, risks, alternatives and complications were also discussed. The patient understands and wishes to proceed with the procedure. Written consent was obtained. Ultrasound was performed to localize and mark an adequate pocket of fluid in the right chest. The area was then prepped and draped in the normal sterile fashion. 1% Lidocaine was used for local anesthesia. Under ultrasound guidance a Safe-T-Centesis catheter was introduced. Thoracentesis was performed. The catheter was removed and a dressing applied. FINDINGS: A total of approximately 1.5 liters of hazy, yellow fluid was removed. Samples were sent to the laboratory as requested by  the clinical team. IMPRESSION: Successful ultrasound guided diagnostic and therapeutic right thoracentesis yielding 1.5 liters of pleural fluid. Read by: Rowe Robert, PA-C Electronically Signed   By: Jerilynn Mages.  Shick M.D.   On: 08/19/2015 14:08    CBC  Recent Labs Lab 08/20/15 0500 08/21/15 0400 08/26/15 0601  WBC 6.1 5.3 7.1  HGB 9.5* 9.2* 9.7*  HCT 28.4* 28.4* 29.4*  PLT 130* 131* 166  MCV 88.5  89.3 90.7  MCH 29.6 28.9 29.9  MCHC 33.5 32.4 33.0  RDW 17.3* 17.4* 18.1*    Chemistries   Recent Labs Lab 08/20/15 0500 08/21/15 0400 08/22/15 0859 08/23/15 0404 08/24/15 0433 08/25/15 0356 08/26/15 0601  NA 138 140 138 139 138 138 141  K 3.6 3.7 3.2* 3.8 3.0* 3.4* 3.2*  CL 101 98* 94* 98* 94* 96* 97*  CO2 24 31 28 28 30 29 31   GLUCOSE 106* 116* 128* 103* 103* 106* 98  BUN 96* 100* 102* 105* 105* 108* 96*  CREATININE 3.49* 3.89* 3.67* 3.83* 3.44* 3.21* 2.82*  CALCIUM 9.7 9.5 9.8 9.5 9.6 9.6 9.9  AST 36 34  --   --   --   --   --   ALT 17 17  --   --   --   --   --   ALKPHOS 99 102  --   --   --   --   --   BILITOT 0.7 0.8  --   --   --   --   --    ------------------------------------------------------------------------------------------------------------------ estimated creatinine clearance is 32.6 mL/min (by C-G formula based on Cr of 2.82). ------------------------------------------------------------------------------------------------------------------ No results for input(s): HGBA1C in the last 72 hours. ------------------------------------------------------------------------------------------------------------------ No results for input(s): CHOL, HDL, LDLCALC, TRIG, CHOLHDL, LDLDIRECT in the last 72 hours. ------------------------------------------------------------------------------------------------------------------ No results for input(s): TSH, T4TOTAL, T3FREE, THYROIDAB in the last 72 hours.  Invalid input(s):  FREET3 ------------------------------------------------------------------------------------------------------------------ No results for input(s): VITAMINB12, FOLATE, FERRITIN, TIBC, IRON, RETICCTPCT in the last 72 hours.  Coagulation profile  Recent Labs Lab 08/23/15 0928 08/23/15 2147 08/24/15 0433 08/25/15 0352 08/26/15 0601  INR 2.54* 2.37* 2.30* 1.87* 1.40    No results for input(s): DDIMER in the last 72 hours.  Cardiac Enzymes No results for input(s): CKMB, TROPONINI, MYOGLOBIN in the last 168 hours.  Invalid input(s): CK ------------------------------------------------------------------------------------------------------------------ Invalid input(s): POCBNP    Talishia Betzler D.O. on 08/26/2015 at 2:06 PM  Between 7am to 7pm - Pager - [phone]  After 7pm go to [email or web address] - password [email or web address]  And look for the night coverage person covering for me after hours  [facility] Office  [phone]

## 2015-08-26 NOTE — Anesthesia Preprocedure Evaluation (Addendum)
Anesthesia Evaluation  Patient identified by MRN, date of birth, ID band Patient awake    Reviewed: Allergy & Precautions, NPO status , Patient's Chart, lab work & pertinent test results, reviewed documented beta blocker date and time   Airway Mallampati: II  TM Distance: >3 FB Neck ROM: Full    Dental  (+) Teeth Intact, Dental Advisory Given   Pulmonary sleep apnea ,    breath sounds clear to auscultation       Cardiovascular hypertension, Pt. on medications and Pt. on home beta blockers  Rhythm:Regular Rate:Normal     Neuro/Psych    GI/Hepatic   Endo/Other  Pt denies diabetes  Renal/GU      Musculoskeletal   Abdominal   Peds  Hematology   Anesthesia Other Findings   Reproductive/Obstetrics                          Anesthesia Physical Anesthesia Plan  ASA: III  Anesthesia Plan: General   Post-op Pain Management:    Induction: Intravenous  Airway Management Planned: LMA  Additional Equipment:   Intra-op Plan:   Post-operative Plan: Extubation in OR  Informed Consent:   Dental advisory given  Plan Discussed with: CRNA, Anesthesiologist and Surgeon  Anesthesia Plan Comments:         Anesthesia Quick Evaluation

## 2015-08-26 NOTE — Transfer of Care (Signed)
Immediate Anesthesia Transfer of Care Note  Patient: Bruce Cole  Procedure(s) Performed: Procedure(s): LEFT RADIOCEPHALIC FISTULA CREATION (Left)  Patient Location: PACU  Anesthesia Type:General  Level of Consciousness: awake and patient cooperative  Airway & Oxygen Therapy: Patient Spontanous Breathing and Patient connected to nasal cannula oxygen  Post-op Assessment: Report given to RN, Post -op Vital signs reviewed and stable and Patient moving all extremities  Post vital signs: Reviewed and stable  Last Vitals:  Filed Vitals:   08/26/15 0436 08/26/15 0945  BP: 131/73 144/63  Pulse: 55 64  Temp: 36.7 C   Resp: 17     Complications: No apparent anesthesia complications

## 2015-08-26 NOTE — Op Note (Signed)
    OPERATIVE REPORT  DATE OF SURGERY: 08/26/2015  PATIENT: Bruce Cole, 75 y.o. male MRN: [name]  DOB: 05-07-1941  PRE-OPERATIVE DIAGNOSIS: Chronic renal insufficiency  POST-OPERATIVE DIAGNOSIS:  Same  PROCEDURE: Left radiocephalic AV fistula  SURGEON:  Curt Jews, M.D.  PHYSICIAN ASSISTANT: Nurse  ANESTHESIA:  Gen.  EBL: Minimal ml  Total I/O In: 550 [P.O.:150; I.V.:400] Out: 1075 [Urine:1025; Blood:50]  BLOOD ADMINISTERED: None  DRAINS: None  SPECIMEN: None  COUNTS CORRECT:  YES  PLAN OF CARE: PACU   PATIENT DISPOSITION:  PACU - hemodynamically stable  PROCEDURE DETAILS: The patient was taken to the operating placed supine position where the area of the left arm was prepped and draped in usual sterile fashion. SonoSite ultrasound was used to visualize the  Cephalic vein. This was easily visualized on the surface at the level of the wrist. The vein was patent throughout its course up to the antecubital space with the main outflow being the basilic vein. Decision was made to proceed with radiocephalic fistula. Incision was made between the radial artery and the cephalic vein. Spot vein branches were ligated and divided. The dominant portion of the vein ran more towards the dorsum of the hand. The vein was ligated distally and divided and was mobilized to the level of the radial artery. The radial artery did have moderate atherosclerotic change.  The artery was occluded proximally and distally was opened with an 11 blade and extended longitudinally with Potts scissors. The vein was cut to appropriate length and was spatulated and sewn end-to-side to the artery with a running 6-0 Prolene suture. Prior to completion of the anastomosis the usual flushing maneuvers were undertaken. The anastomosis was completed and clamps removed with excellent thrill noted in the vein. The wounds were irrigated with saline. Hemostasis was obtained left cautery. The wounds were closed with  3-0 Vicryl in the subcutaneous and subcuticular tissue. Benzoin Steri-Strips were applied the patient was transferred to the recovery room in stable condition   Curt Jews, M.D. 08/26/2015 1:43 PM

## 2015-08-26 NOTE — Interval H&P Note (Signed)
History and Physical Interval Note:  08/26/2015 10:43 AM  Bruce Cole  has presented today for surgery, with the diagnosis of End Stage Renal Disease  N18.6  The various methods of treatment have been discussed with the patient and family. After consideration of risks, benefits and other options for treatment, the patient has consented to  Procedure(s): ARTERIOVENOUS (AV) FISTULA CREATION- LEFT BRACHIOCEPHALIC FISTULA (Left) as a surgical intervention .  The patient's history has been reviewed, patient examined, no change in status, stable for surgery.  I have reviewed the patient's chart and labs.  Questions were answered to the patient's satisfaction.     Curt Jews

## 2015-08-26 NOTE — Progress Notes (Signed)
PT Cancellation Note  Patient Details Name: Bruce Cole MRN: [name] DOB: [birth year]   Cancelled Treatment:    Reason Eval/Treat Not Completed: Patient declined, no reason specified. Patient for procedure today, he politely asked to hold PT at this time.   Duncan Dull 08/26/2015, 8:43 AM Alben Deeds, PT DPT  [phone]

## 2015-08-26 NOTE — Anesthesia Procedure Notes (Signed)
Procedure Name: LMA Insertion Date/Time: 08/26/2015 11:25 AM Performed by: Williemae Area B Pre-anesthesia Checklist: Patient identified, Emergency Drugs available, Suction available and Patient being monitored Patient Re-evaluated:Patient Re-evaluated prior to inductionOxygen Delivery Method: Circle system utilized Preoxygenation: Pre-oxygenation with 100% oxygen Intubation Type: IV induction Ventilation: Mask ventilation without difficulty LMA: LMA inserted LMA Size: 5.0 Number of attempts: 1 Placement Confirmation: positive ETCO2 and breath sounds checked- equal and bilateral Tube secured with: Tape (taped across cheeks) Dental Injury: Teeth and Oropharynx as per pre-operative assessment

## 2015-08-26 NOTE — H&P (View-Only) (Signed)
[facility]  Subjective  - feels ok   Objective 128/60 53 98 F (36.7 C) (Oral) 16 98%  Intake/Output Summary (Last 24 hours) at 08/25/15 [id number] Last data filed at 08/25/15 0743  Gross per 24 hour  Intake    820 ml  Output   1900 ml  Net  -1080 ml   Upper extremity exam unchanged  Assessment/Planning: INR starting to trend down but still over 1.8 today despite no coumadin for the last 3 days Will give 5 mg Vitamin K today Plan for left arm AV fistula tomorrow by Dr Donnetta Hutching.   Dr Mercy Moore will call today if he wishes Palindrome catheter as well  Ruta Hinds 08/25/2015 9:28 AM --  Laboratory Lab Results: No results for input(s): WBC, HGB, HCT, PLT in the last 72 hours. BMET  Recent Labs  08/24/15 0433 08/25/15 0356  NA 138 138  K 3.0* 3.4*  CL 94* 96*  CO2 30 29  GLUCOSE 103* 106*  BUN 105* 108*  CREATININE 3.44* 3.21*  CALCIUM 9.6 9.6    COAG Lab Results  Component Value Date   INR 1.87* 08/25/2015   INR 2.30* 08/24/2015   INR 2.37* 08/23/2015   No results found for: PTT

## 2015-08-26 NOTE — Anesthesia Postprocedure Evaluation (Signed)
Anesthesia Post Note  Patient: Bruce Cole  Procedure(s) Performed: Procedure(s) (LRB): LEFT RADIOCEPHALIC FISTULA CREATION (Left)  Patient location during evaluation: PACU Anesthesia Type: General Level of consciousness: awake and awake and alert Pain management: pain level controlled Respiratory status: spontaneous breathing and nonlabored ventilation Cardiovascular status: blood pressure returned to baseline Anesthetic complications: no    Last Vitals:  Filed Vitals:   08/26/15 1545 08/26/15 1645  BP: 122/66 117/65  Pulse: 51 51  Temp:    Resp:      Last Pain:  Filed Vitals:   08/26/15 1655  PainSc: 0-No pain                 Ky Moskowitz COKER

## 2015-08-26 NOTE — Progress Notes (Signed)
[facility] Progress Note    Assessment/ Plan:   1. Acute on chronic kidney disease stage IV: Patient with h/o CKD from underlying hypertensive nephrosclerosis, NSAID nephropathy, and chronic cardiorenal syndrome presenting with worsening renal function in the setting of an acute CHF exacerbation.  FEurea is 12.95%, suggestive of a pre-renal azotemia. UOP stable today (1.6L out, net -500cc in the last 24hrs) with a stable weight. No need for emergent HD. SCr improving from 3.83>3.44>3.21>2.82 and BUN starting to improve 108>96.   Coumadin stopped and patient given a dose of vitamin K yesterday in preparation for AVF placement today. Will hold off on getting temporary catheter as patient's function is improving. Continue Lasix 80mg  PO TID. Patient has follow up with Dr. [name] on 3/1 at 9:45am. Renal will sign off, please call us with questions.   2. Acute on chronic combined systolic and diastolic CHF exacerbation: Likely contributing on #1. Repeat echo this hospitalization with a slightly decreased EF to 40-45%. Advanced heart failure team signed off. Continue Lasix 80mg  PO TID.   Continue coreg, not on ACE inhibitor due to worsening kidney function in the past.    3. Normocytic Anemia: Hgb appears stable this admission.  No evidence of active bleeding. Noted to be iron deficient on recent labwork. Received Feraheme on 2/2, continue Aranesp weekly here.   4. Atrial fibrillation: currently rate controlled. Transitioned from coumadin to heparin for potential surgery on Wednesday. No evidence of active bleeding.  Will need INR normalized prior to vascular surgery access placement  5. Hypertension: BPs improved and still at goal with discontinuation of amlodipine, however did have 1 drop in BP down to 98/40s, I suspect while sleeping.  Continue with current regimen per primary team, holding amlodipine.   6. Gout: No active flare currently. On allopurinol. Renally dose colchine if  patient has an acute flare in the future.  7. Hypokalemia: K down to 3.2 today, most likely in the setting of diuresis with Lasix. Replete with KDUR 23mEQ BID x 2 dose.   Subjective:   Patient doing well. Denies chest pain, coughh, or SOB.      Objective:   BP 131/73 mmHg  Pulse 55  Temp(Src) 98 F (36.7 C) (Oral)  Resp 17  Ht 6' (1.829 m)  Wt 280 lb 3.2 oz (127.098 kg)  BMI 37.99 kg/m2  SpO2 97%  Intake/Output Summary (Last 24 hours) at 08/26/15 0832 Last data filed at 08/26/15 0817  Gross per 24 hour  Intake 1095.57 ml  Output   2125 ml  Net -1029.43 ml   Weight change: -9.6 oz (-0.272 kg)  Physical Exam: General: Sitting up on the edge of the bed comfortably. Non-toxic Cardiovascular: Bradycardic in 50s, regular rhythm.  II/VI systolic murmur. No rubs, or gallops noted. JVD at 8cm.  Respiratory: No increased WOB. CTAB without wheezing, rhonchi, or crackles noted.  Abdomen: +BS, soft, non-distended, non-tender.  Ext: 2+ pitting edema in the ankles, 1+ pitting edema pre-tib bilaterally.   Imaging: No results found.  Labs: BMET  Recent Labs Lab 08/20/15 0500 08/21/15 0400 08/22/15 0859 08/23/15 0404 08/24/15 0433 08/25/15 0356 08/26/15 0601  NA 138 140 138 139 138 138 141  K 3.6 3.7 3.2* 3.8 3.0* 3.4* 3.2*  CL 101 98* 94* 98* 94* 96* 97*  CO2 24 31 28 28 30 29 31   GLUCOSE 106* 116* 128* 103* 103* 106* 98  BUN 96* 100* 102* 105* 105* 108* 96*  CREATININE 3.49* 3.89* 3.67* 3.83* 3.44* 3.21*  2.82*  CALCIUM 9.7 9.5 9.8 9.5 9.6 9.6 9.9  PHOS  --   --  4.0 3.7 3.1 2.7 2.4*   CBC  Recent Labs Lab 08/20/15 0500 08/21/15 0400 08/26/15 0601  WBC 6.1 5.3 7.1  HGB 9.5* 9.2* 9.7*  HCT 28.4* 28.4* 29.4*  MCV 88.5 89.3 90.7  PLT 130* 131* 166    Medications:    . allopurinol  200 mg Oral Daily  . carvedilol  3.125 mg Oral BID  . cefUROXime (ZINACEF)  IV  1.5 g Intravenous To SS-Surg  . darbepoetin (ARANESP) injection - NON-DIALYSIS  60 mcg Subcutaneous Q  Fri-1800  . furosemide  80 mg Oral TID  . guaiFENesin  600 mg Oral BID  . hydrALAZINE  50 mg Oral BID  . hydroxychloroquine  200 mg Oral BID  . isosorbide mononitrate  30 mg Oral Daily  . multivitamin with minerals  1 tablet Oral Daily  . polyethylene glycol  17 g Oral Daily  . sodium chloride flush  3 mL Intravenous Q12H  . terazosin  1 mg Oral QHS    Archie Patten, MD  PGY-2,  [facility] 08/26/2015 8:32 AM I have seen and examined this patient and agree with plan per Dr Lorenso Courier.  Renal fx cont to improve.  To go for for AVF today.  K low and getting replacement.  Has appt Dr [name] 09/16/15 at 9:45am.  BP lowish so will decrease hydralazine. Elexia Friedt T,MD 08/26/2015 9:33 AM

## 2015-08-27 ENCOUNTER — Telehealth: Payer: Self-pay | Admitting: Vascular Surgery

## 2015-08-27 ENCOUNTER — Encounter (HOSPITAL_COMMUNITY): Payer: Self-pay | Admitting: Vascular Surgery

## 2015-08-27 DIAGNOSIS — N19 Unspecified kidney failure: Secondary | ICD-10-CM

## 2015-08-27 LAB — CBC
HCT: 30.4 % — ABNORMAL LOW (ref 39.0–52.0)
Hemoglobin: 9.5 g/dL — ABNORMAL LOW (ref 13.0–17.0)
MCH: 28.6 pg (ref 26.0–34.0)
MCHC: 31.3 g/dL (ref 30.0–36.0)
MCV: 91.6 fL (ref 78.0–100.0)
Platelets: 164 10*3/uL (ref 150–400)
RBC: 3.32 MIL/uL — ABNORMAL LOW (ref 4.22–5.81)
RDW: 18.7 % — AB (ref 11.5–15.5)
WBC: 8.4 10*3/uL (ref 4.0–10.5)

## 2015-08-27 LAB — HEPARIN LEVEL (UNFRACTIONATED): Heparin Unfractionated: 0.2 IU/mL — ABNORMAL LOW (ref 0.30–0.70)

## 2015-08-27 LAB — RENAL FUNCTION PANEL
Albumin: 3.7 g/dL (ref 3.5–5.0)
Anion gap: 11 (ref 5–15)
BUN: 91 mg/dL — AB (ref 6–20)
CHLORIDE: 99 mmol/L — AB (ref 101–111)
CO2: 31 mmol/L (ref 22–32)
CREATININE: 2.93 mg/dL — AB (ref 0.61–1.24)
Calcium: 9.7 mg/dL (ref 8.9–10.3)
GFR calc Af Amer: 23 mL/min — ABNORMAL LOW (ref >60)
GFR calc non Af Amer: 20 mL/min — ABNORMAL LOW (ref >60)
GLUCOSE: 113 mg/dL — AB (ref 65–99)
Phosphorus: 3.6 mg/dL (ref 2.5–4.6)
Potassium: 3.6 mmol/L (ref 3.5–5.1)
Sodium: 141 mmol/L (ref 135–145)

## 2015-08-27 LAB — PROTIME-INR
INR: 1.36 (ref 0.00–1.49)
PROTHROMBIN TIME: 16.9 s — AB (ref 11.6–15.2)

## 2015-08-27 MED ORDER — WARFARIN SODIUM 7.5 MG PO TABS: 7.5000 mg | ORAL_TABLET | Freq: Once | ORAL | Status: DC

## 2015-08-27 MED ORDER — WARFARIN SODIUM 10 MG PO TABS
10.0000 mg | ORAL_TABLET | Freq: Once | ORAL | Status: AC
  Administered 2015-08-27: 10 mg via ORAL
  Filled 2015-08-27: qty 1

## 2015-08-27 MED ORDER — WARFARIN - PHARMACIST DOSING INPATIENT: Freq: Every day | Status: DC

## 2015-08-27 NOTE — Progress Notes (Signed)
ANTICOAGULATION CONSULT NOTE - Follow Up Consult  Pharmacy Consult for heparin Indication: atrial fibrillation  Labs:  Recent Labs  08/25/15 0352 08/25/15 0356 08/25/15 1426 08/25/15 2300 08/26/15 0601 08/27/15 0303  HGB  --   --   --   --  9.7* 9.5*  HCT  --   --   --   --  29.4* 30.4*  PLT  --   --   --   --  166 164  LABPROT 21.4*  --   --   --  17.3* 16.9*  INR 1.87*  --   --   --  1.40 1.36  HEPARINUNFRC  --   --  0.12* 0.26*  --  0.20*  CREATININE  --  3.21*  --   --  2.82* 2.93*    Assessment: 74yo male subtherapeutic on heparin after resumed post-op.  Goal of Therapy:  Heparin level 0.3-0.7 units/ml   Plan:  Will increase heparin gtt by 1-2 units/kg/hr to 1800 units/hr and check level in [place], PharmD, BCPS  08/27/2015,3:54 AM

## 2015-08-27 NOTE — Progress Notes (Addendum)
ANTICOAGULATION CONSULT NOTE - Follow Up Consult  Pharmacy Consult for Heparin/Warfarin Indication: atrial fibrillation  Allergies  Allergen Reactions  . Ace Inhibitors Other (See Comments)    Worsening renal insufficiency    Patient Measurements: Height: 6' (182.9 cm) Weight: 278 lb 1.6 oz (126.145 kg) (scale b) IBW/kg (Calculated) : 77.6  Heparin dosing wt: 106 kg  Vital Signs: Temp: 97.1 F (36.2 C) (02/09 0519) Temp Source: Oral (02/09 0519) BP: 141/76 mmHg (02/09 1000) Pulse Rate: 56 (02/09 1000)  Labs:  Recent Labs  08/25/15 0352 08/25/15 0356 08/25/15 1426 08/25/15 2300 08/26/15 0601 08/27/15 0303  HGB  --   --   --   --  9.7* 9.5*  HCT  --   --   --   --  29.4* 30.4*  PLT  --   --   --   --  166 164  LABPROT 21.4*  --   --   --  17.3* 16.9*  INR 1.87*  --   --   --  1.40 1.36  HEPARINUNFRC  --   --  0.12* 0.26*  --  0.20*  CREATININE  --  3.21*  --   --  2.82* 2.93*    Estimated Creatinine Clearance: 31.3 mL/min (by C-G formula based on Cr of 2.93).   Assessment: 51yom admited with HF volume overload and renal dysfunction.  Plan for AVG placement 2/8.  He is on warfarin PTA for Afib.  INR 1.36 this am, new orders to resume warfarin tonight. Will give extra today prior to d/c and would try to bump dose tomorrow also then resume home dose of 7.5mg  daily with INR check next week.  Heparin level cancelled with plans to d/c home.  Chadsvasc score of 3 so low to moderate risk, likely ok to continue warfarin as outpatient without parenteral bridge.   Goal of Therapy:  INR 2-3 Heparin level 0.3-0.7 units/ml Monitor platelets by anticoagulation protocol: Yes   Plan:  Restart warfarin tonight at 10mg  today prior to d/c Daily INR  Erin Hearing PharmD., BCPS Clinical Pharmacist Pager [phone] 08/27/2015 10:25 AM

## 2015-08-27 NOTE — Progress Notes (Signed)
[facility]                                                                              Patient Demographics  Bruce Cole, is a 75 y.o. male, DOB - 02-Jan-1941, [id number]  Admit date - 08/17/2015   Admitting Physician Norval Morton, MD  Outpatient Primary MD for the patient is Nance Pear., NP  LOS - 10   Chief Complaint  Patient presents with  . Shortness of Breath      HPI on 08/18/2015 by Dr. Fuller Plan [name] is a 75 year old male with a past medical history significant for HTN, A. fib on Coumadin, CHF, and CKD; who presented with increasing shortness of breath, leg swelling, and dyspnea on exertion over the last week. Patient was seen by his primary care provider today and referred to the emergency department. Patient notes a 7- 8 pound weight gain despite being on Lasix 80 mg twice a day. Patient reports having a cough which is nonproductive. Denies any chest pain, nausea, vomiting, fever, or chills. Mr. Volkers also states that he's had difficulty urinating over this last week as well. Review of patient's last echocardiogram back in 03/2013 showed EF of 45-50% with grade 2 diastolic dysfunction. He notes that he's had congestive heart failure for 3-4 years, but had been treated as an outpatient.  Upon admission to the emergency department patient was evaluated and seen have elevated BNP at 204.1. Creatinine function have progressively deteriorated and was noted to be 2.59 on 08/04/2015, and today elevated at 3.64. Patient had been given 30 mg of IV Lasix on the 29th when he went to the emergency department, and then again on the 30th symptoms were not improved prior to being transferred to [facility] for admission.  Assessment & Plan   Acute on chronic combined systolic and diastolic CHF exacerbation ([place]):  -Patient with increased weight gain not relieved by po Lasix. Patient with +2 pitting edema lower extremity.  -BNP elevated at 204.1 on  admission. Last EF 45-50% showing combined systolic and diastolic dysfunction back in 03/2013. -Cardiology consulted and appreciated -Follow intake/output, daily weight, elevate legs, salt and fluid restriction. -Lasix switched to oral, Lasix 80 mg 3 times a day.  Acute renal failure on chronic kidney disease stage IV -Patient's kidney function has progressively worsened over the last 2 weeks from 2.59 on 1/17 to 3.64 on admission. -Creatinine 2.93 today -Renal ultrasound showed medical renal disease. -Nephrology consulted and appreciated -Vascular surgery consulted for Left AVF placed 08/26/15  Pleural effusion -Right-sided pleural effusion status post thoracentesis with removal of 1.5 L. -Likely effusion secondary to CHF.  Diabetes mellitus type 2 -Controlled diabetes mellitus type 2, hemoglobin A1c is 6.7 on 08/04/15. -Continue ISS and CBG monitoring  Atrial fibrillation on Coumadin -CHA2DS2-VASc score 4. -INR 1.36 -Coumadin stopped, will be on IV heparin anticipating fistula creation. -Will restart coumadin today  Hypertension -Continue amlodipine, Coreg, and other blood pressure medications as listed above  Question of acute urinary retention -Resolved   Hypokalemia -Resolved, continue to monitor BMP -Likely secondary to diuresis  Code Status: Full   Family Communication: None at bedside  Disposition Plan: Admitted. Possible discharge within  24-48hours, pending INR  Time Spent in minutes   30 minutes  Procedures  Left AVF   Consults   Cardiology Nephrology Vascular surgery  DVT Prophylaxis  heparin  Lab Results  Component Value Date   PLT 164 08/27/2015    Medications  Scheduled Meds: . allopurinol  200 mg Oral Daily  . carvedilol  3.125 mg Oral BID  . darbepoetin (ARANESP) injection - NON-DIALYSIS  60 mcg Subcutaneous Q Fri-1800  . furosemide  80 mg Oral TID  . guaiFENesin  600 mg Oral BID  . hydrALAZINE  25 mg Oral BID  . hydroxychloroquine  200  mg Oral BID  . isosorbide mononitrate  30 mg Oral Daily  . multivitamin with minerals  1 tablet Oral Daily  . polyethylene glycol  17 g Oral Daily  . sodium chloride flush  3 mL Intravenous Q12H  . terazosin  1 mg Oral QHS  . warfarin  7.5 mg Oral ONCE-1800  . Warfarin - Pharmacist Dosing Inpatient   Does not apply q1800   Continuous Infusions: . sodium chloride 10 mL/hr at 08/26/15 1021  . heparin 1,800 Units/hr (08/27/15 0832)   PRN Meds:.sodium chloride, acetaminophen, albuterol, ondansetron (ZOFRAN) IV, oxyCODONE, oxymetazoline, sodium chloride flush, sorbitol, traMADol, zolpidem  Antibiotics    Anti-infectives    Start     Dose/Rate Route Frequency Ordered Stop   08/26/15 1130  cefUROXime (ZINACEF) 1.5 g in dextrose 5 % 50 mL IVPB     1.5 g 100 mL/hr over 30 Minutes Intravenous To [facility] 08/25/15 0916 08/27/15 1130   08/17/15 2230  hydroxychloroquine (PLAQUENIL) tablet 200 mg     200 mg Oral 2 times daily 08/17/15 2205        Subjective:   Bruce Cole seen and examined today.   Patient denies chest pain, shortness of breath, abdominal pain, N/V/D/C.  Feels his is ready to go home. Gets short of breath with movement.  Objective:   Filed Vitals:   08/27/15 0056 08/27/15 0519 08/27/15 1000 08/27/15 1202  BP: 119/73 127/64 141/76 118/70  Pulse: 48 52 56 43  Temp: 97.8 F (36.6 C) 97.1 F (36.2 C)  98.2 F (36.8 C)  TempSrc: Oral Oral  Oral  Resp: 20 20  18   Height:      Weight:  126.145 kg (278 lb 1.6 oz)    SpO2: 95% 93% 92% 100%    Wt Readings from Last 3 Encounters:  08/27/15 126.145 kg (278 lb 1.6 oz)  08/17/15 131.997 kg (291 lb)  08/12/15 127.007 kg (280 lb)     Intake/Output Summary (Last 24 hours) at 08/27/15 1234 Last data filed at 08/27/15 1205  Gross per 24 hour  Intake 1517.03 ml  Output   1125 ml  Net 392.03 ml    Exam  General: Well developed, well nourished, NAD  HEENT: NCAT, mucous membranes moist.   Cardiovascular:  S1 S2 auscultated, 2/6 SEM,  RRR  Respiratory: Clear to auscultation bilaterally   Abdomen: Soft, nontender, nondistended, + bowel sounds  Extremities: warm dry without cyanosis clubbing. 2+ edema in LE B/L  Neuro: AAOx3, nonfocal  Psych: Normal affect and demeanor, pleasant  Data Review   Micro Results Recent Results (from the past 240 hour(s))  Culture, body fluid-bottle     Status: None   Collection Time: 08/19/15  1:45 PM  Result Value Ref Range Status   Specimen Description FLUID RIGHT PLEURAL  Final   Special Requests BOTTLES DRAWN AEROBIC AND ANAEROBIC 10CCS  Final   Culture NO GROWTH 5 DAYS  Final   Report Status 08/24/2015 FINAL  Final  Gram stain     Status: None   Collection Time: 08/19/15  1:45 PM  Result Value Ref Range Status   Specimen Description FLUID RIGHT PLEURAL  Final   Special Requests NONE  Final   Gram Stain   Final    FEW WBC PRESENT, PREDOMINANTLY PMN NO ORGANISMS SEEN    Report Status 08/19/2015 FINAL  Final    Radiology Reports Dg Chest 1 View  08/19/2015  CLINICAL DATA:  Status post right-sided thoracentesis. EXAM: CHEST 1 VIEW COMPARISON:  August 17, 2015. FINDINGS: Stable cardiomegaly is noted. No pneumothorax or significant pleural effusion is noted. No acute pulmonary disease is noted. Bony thorax is unremarkable. IMPRESSION: Stable cardiomegaly.  No other significant abnormality seen. Electronically Signed   By: Marijo Conception, M.D.   On: 08/19/2015 14:22   Dg Chest 2 View  08/17/2015  CLINICAL DATA:  Shortness of breath for 1 week. EXAM: CHEST  2 VIEW COMPARISON:  08/12/2015. FINDINGS: Development of small right pleural effusion. Associated right basilar opacity. Cardiomegaly is stable. No pulmonary edema or pneumothorax. Osseous structures are unchanged. IMPRESSION: Developed of small right pleural effusion. Associated right basilar opacity may reflect compressive atelectasis. Pneumonia could have a similar appearance in the appropriate  clinical setting. Cardiomegaly is stable. Electronically Signed   By: Jeb Levering M.D.   On: 08/17/2015 01:08   Dg Chest 2 View  08/12/2015  CLINICAL DATA:  Shortness of breath for 2 days. Difficulty urinating. Initial encounter. EXAM: CHEST  2 VIEW COMPARISON:  PA and lateral chest 04/15/2015. FINDINGS: There is marked cardiomegaly without pulmonary edema. No pneumothorax or pleural effusion is identified. IMPRESSION: Cardiomegaly without acute disease. Electronically Signed   By: Inge Rise M.D.   On: 08/12/2015 20:05   Ct Chest Wo Contrast  08/18/2015  CLINICAL DATA:  Follow-up right pleural effusion. History of atrial fibrillation. EXAM: CT CHEST WITHOUT CONTRAST TECHNIQUE: Multidetector CT imaging of the chest was performed following the standard protocol without IV contrast. COMPARISON:  Chest radiograph 08/17/2015 and abdominal CT 05/01/2015 FINDINGS: Mediastinum/Lymph Nodes: No gross abnormality in the visualized thyroid tissue. No significant mediastinal or hilar lymphadenopathy but limited evaluation without intravenous contrast. Enlargement of the mid ascending thoracic aorta measuring up to 4.1 cm. Aneurysm of the proximal descending thoracic aorta measuring up to 4.8 cm. The mid descending thoracic aorta measures 4.0. The distal descending thoracic aorta measures 3.4 cm. Heart size is enlarged. No significant pericardial fluid. Lungs/Pleura: There is a moderate sized right pleural effusion with extensive volume loss throughout the right lower lobe. Minimal aeration in the right lower lobe. Scattered ground-glass densities in the left lower lobe probably represent areas of atelectasis. No clear evidence for pulmonary edema. Upper abdomen: Limited evaluation of the upper abdomen on this non contrast examination. Fullness in left adrenal gland probably related to an adenoma. Probable exophytic cyst involving the left kidney pole Musculoskeletal: Laminectomy changes in lower thoracic spine.  Multilevel degenerative disc disease in the thoracic spine. IMPRESSION: Moderate-sized right pleural effusion with near complete collapse of the right lower lobe. Limited evaluation of the right lower lobe due to volume loss. Cardiomegaly. Aneurysm of the descending thoracic aorta, measuring up to 4.8 cm. Aneurysm of the ascending thoracic aorta measuring up to 4.1 cm. Recommend semi-annual imaging followup by CTA or MRA and referral to cardiothoracic surgery if not already obtained. This recommendation follows 2010  ACCF/AHA/AATS/ACR/ASA/SCA/SCAI/SIR/STS/SVM Guidelines for the Diagnosis and Management of Patients With Thoracic Aortic Disease. Circulation. 2010; [id number] Electronically Signed   By: Markus Daft M.D.   On: 08/18/2015 18:39   US Renal  08/18/2015  CLINICAL DATA:  Acute kidney injury, known chronic renal insufficiency. EXAM: RENAL / URINARY TRACT ULTRASOUND COMPLETE COMPARISON:  Noncontrast CT scan of the abdomen and pelvis dated May 01, 2015 and abdominal ultrasound of April 08, 2013 FINDINGS: Right Kidney: Length: 14.2 cm. The renal cortical echotexture is approximately equal to that of the liver. There is no hydronephrosis. There are multiple simple appearing cortical cysts. The largest cyst measures 2.5 x 1.9 x 2.2 cm. A midpole cyst measures 2.1 x 1.8 x 1.7 cm. Left Kidney: Length: 12.8 cm. The renal cortical echotexture is similar to that on the left. There are multiple cortical cysts. One in the upper pole measures 2.8 x 2.8 x 2.6 cm. A second cyst in the midpole measures 3.1 x 2.5 x 3 cm. There is no hydronephrosis. Bladder: The urinary bladder is decompressed due to postvoid state. There is a moderate-sized right pleural effusion. IMPRESSION: 1. Increased renal cortical echotexture which is been previously demonstrated consistent with medical renal disease. No significant renal cortical atrophy is observed. There are multiple simple appearing cortical cysts bilaterally. There is  no hydronephrosis. 2. There is a moderate-sized right pleural effusion. Electronically Signed   By: David  Martinique M.D.   On: 08/18/2015 11:27   Dg Chest 2v Repeat Same Day  08/17/2015  CLINICAL DATA:  Increased shortness of breath and leg swelling. EXAM: CHEST  2 VIEW COMPARISON:  08/17/2015 and 08/12/2015 and 04/15/2015 FINDINGS: Again noted are right basilar chest densities and suspect elevation of the right hemidiaphragm. There may be compressive atelectasis or airspace disease along the medial right lung base. Left lung remains clear. Heart and mediastinum are stable. Stable mild cardiomegaly. IMPRESSION: Persistent densities at the right lung base, particularly along the medial right lung base. Findings could represent a combination of consolidation and pleural fluid. This represents a significant change from the examination in 2016. Recommend follow-up to ensure improved aeration at the right lung base. If aeration does not improve, recommend further characterization with CT. Electronically Signed   By: Markus Daft M.D.   On: 08/17/2015 16:13   US Thoracentesis Asp Pleural Space W/img Guide  08/19/2015  INDICATION: CHF, chronic kidney disease, dyspnea, right pleural effusion. Request is made for diagnostic and therapeutic right thoracentesis. EXAM: ULTRASOUND GUIDED DIAGNOSTIC AND THERAPEUTIC RIGHT THORACENTESIS MEDICATIONS: None. COMPLICATIONS: None immediate. PROCEDURE: An ultrasound guided thoracentesis was thoroughly discussed with the patient and questions answered. The benefits, risks, alternatives and complications were also discussed. The patient understands and wishes to proceed with the procedure. Written consent was obtained. Ultrasound was performed to localize and mark an adequate pocket of fluid in the right chest. The area was then prepped and draped in the normal sterile fashion. 1% Lidocaine was used for local anesthesia. Under ultrasound guidance a Safe-T-Centesis catheter was introduced.  Thoracentesis was performed. The catheter was removed and a dressing applied. FINDINGS: A total of approximately 1.5 liters of hazy, yellow fluid was removed. Samples were sent to the laboratory as requested by the clinical team. IMPRESSION: Successful ultrasound guided diagnostic and therapeutic right thoracentesis yielding 1.5 liters of pleural fluid. Read by: Rowe Robert, PA-C Electronically Signed   By: Jerilynn Mages.  Shick M.D.   On: 08/19/2015 14:08    CBC  Recent Labs Lab 08/21/15 0400 08/26/15 0601  08/27/15 0303  WBC 5.3 7.1 8.4  HGB 9.2* 9.7* 9.5*  HCT 28.4* 29.4* 30.4*  PLT 131* 166 164  MCV 89.3 90.7 91.6  MCH 28.9 29.9 28.6  MCHC 32.4 33.0 31.3  RDW 17.4* 18.1* 18.7*    Chemistries   Recent Labs Lab 08/21/15 0400  08/23/15 0404 08/24/15 0433 08/25/15 0356 08/26/15 0601 08/27/15 0303  NA 140  < > 139 138 138 141 141  K 3.7  < > 3.8 3.0* 3.4* 3.2* 3.6  CL 98*  < > 98* 94* 96* 97* 99*  CO2 31  < > 28 30 29 31 31   GLUCOSE 116*  < > 103* 103* 106* 98 113*  BUN 100*  < > 105* 105* 108* 96* 91*  CREATININE 3.89*  < > 3.83* 3.44* 3.21* 2.82* 2.93*  CALCIUM 9.5  < > 9.5 9.6 9.6 9.9 9.7  AST 34  --   --   --   --   --   --   ALT 17  --   --   --   --   --   --   ALKPHOS 102  --   --   --   --   --   --   BILITOT 0.8  --   --   --   --   --   --   < > = values in this interval not displayed. ------------------------------------------------------------------------------------------------------------------ estimated creatinine clearance is 31.3 mL/min (by C-G formula based on Cr of 2.93). ------------------------------------------------------------------------------------------------------------------ No results for input(s): HGBA1C in the last 72 hours. ------------------------------------------------------------------------------------------------------------------ No results for input(s): CHOL, HDL, LDLCALC, TRIG, CHOLHDL, LDLDIRECT in the last 72  hours. ------------------------------------------------------------------------------------------------------------------ No results for input(s): TSH, T4TOTAL, T3FREE, THYROIDAB in the last 72 hours.  Invalid input(s): FREET3 ------------------------------------------------------------------------------------------------------------------ No results for input(s): VITAMINB12, FOLATE, FERRITIN, TIBC, IRON, RETICCTPCT in the last 72 hours.  Coagulation profile  Recent Labs Lab 08/23/15 2147 08/24/15 0433 08/25/15 0352 08/26/15 0601 08/27/15 0303  INR 2.37* 2.30* 1.87* 1.40 1.36    No results for input(s): DDIMER in the last 72 hours.  Cardiac Enzymes No results for input(s): CKMB, TROPONINI, MYOGLOBIN in the last 168 hours.  Invalid input(s): CK ------------------------------------------------------------------------------------------------------------------ Invalid input(s): POCBNP    Gemma Ruan D.O. on 08/27/2015 at 12:34 PM  Between 7am to 7pm - Pager - [phone]  After 7pm go to [email or web address] - password [email or web address]  And look for the night coverage person covering for me after hours  [facility] Office  [phone]

## 2015-08-27 NOTE — Progress Notes (Signed)
Patient ID: Bruce Cole, male   DOB: 09/22/1940, 75 y.o.   MRN: [name] Reports soreness and numbness around the area of incision in his left wrist. Dressing removed. Steri-Strips intact. No hematoma. Good thrill in his cephalic vein fistula. Will not follow actively. Will see in the office in 1 month for continued follow-up of AV fistula creation. Please call if we can help.

## 2015-08-27 NOTE — Telephone Encounter (Signed)
-----   Message from Mena Goes, RN sent at 08/26/2015 12:03 PM EST ----- Regarding: schedule   ----- Message -----    From: Gabriel Earing, PA-C    Sent: 08/26/2015  11:44 AM      To: [facility]vs Charge Pool  S/p LUE AVF 2.8.7.  F/u with Dr. Donnetta Hutching in 4 weeks with duplex.  Thanks, Aldona Bar

## 2015-08-27 NOTE — Telephone Encounter (Signed)
LM for pt re appt, dpm °

## 2015-08-27 NOTE — Care Management Important Message (Signed)
Important Message  Patient Details  Name: Bruce Cole MRN: [name] Date of Birth: 04/14/1941   Medicare Important Message Given:  Yes    Loann Quill 08/27/2015, 8:19 AM

## 2015-08-27 NOTE — Progress Notes (Signed)
[facility] Progress Note    Assessment/ Plan:   1. Acute on chronic kidney disease stage IV: Patient with h/o CKD from underlying hypertensive nephrosclerosis, NSAID nephropathy, and chronic cardiorenal syndrome presenting with worsening renal function in the setting of an acute CHF exacerbation.  FEurea is 12.95%, suggestive of a pre-renal azotemia. UOP stable today (1.9L out, net -670cc in the last 24hrs) with a 2lb weight loss. SCr stable from 2.82>2.93 and BUN improving 96>91. Vascular placed a L radiocephalic AV fistula on 2/8. Continue Lasix 80mg  PO TID. Patient has follow up with Dr. [name] on 3/1 at 9:45am.  2. Acute on chronic combined systolic and diastolic CHF exacerbation: Likely contributing on #1. Repeat echo this hospitalization with a slightly decreased EF to 40-45%. Advanced heart failure team signed off. Continue Lasix 80mg  PO TID.   Continue coreg, not on ACE inhibitor due to worsening kidney function in the past.    3. Normocytic Anemia: Hgb appears stable this admission.  No evidence of active bleeding. Noted to be iron deficient on recent labwork. Received Feraheme on 2/2, continue Aranesp weekly here.   4. Atrial fibrillation: currently rate controlled. Currently on heparin still.   5. Hypertension: BPs improved and still at goal with discontinuation of amlodipine and decrease in hydralazine, however did have 1 drop in BP down to 91/61, I suspect this was during or shortly after the AVF placement.  Continue with current regimen per primary team, holding amlodipine with the decrease in hydralazine.   6. Gout: No active flare currently. On allopurinol. Renally dose colchine if patient has an acute flare in the future.  7. Hypokalemia: Resolved. Consider re-starting home KDUR   Subjective:   Patient doing well. Denies chest pain, coughh, or SOB. Tolerated AVF placement well. Already had BM last night.      Objective:   BP 127/64 mmHg  Pulse 52   Temp(Src) 97.1 F (36.2 C) (Oral)  Resp 20  Ht 6' (1.829 m)  Wt 278 lb 1.6 oz (126.145 kg)  BMI 37.71 kg/m2  SpO2 93%  Intake/Output Summary (Last 24 hours) at 08/27/15 0646 Last data filed at 08/27/15 0526  Gross per 24 hour  Intake 1277.03 ml  Output   1950 ml  Net -672.97 ml   Weight change: -2 lb 1.6 oz (-0.953 kg)  Physical Exam: General: Lying in bed comfortably. Non-toxic Cardiovascular: Bradycardic in 50s, regular rhythm.  II/VI systolic murmur. No rubs, or gallops noted. No JVD  Respiratory: No increased WOB. CTAB without wheezing, rhonchi, or crackles noted.  Abdomen: +BS, soft, mildly distended, non-tender.  Ext: 1- 2+ pitting edema in the ankles  Imaging: No results found.  Labs: BMET  Recent Labs Lab 08/21/15 0400 08/22/15 0859 08/23/15 0404 08/24/15 0433 08/25/15 0356 08/26/15 0601 08/27/15 0303  NA 140 138 139 138 138 141 141  K 3.7 3.2* 3.8 3.0* 3.4* 3.2* 3.6  CL 98* 94* 98* 94* 96* 97* 99*  CO2 31 28 28 30 29 31 31   GLUCOSE 116* 128* 103* 103* 106* 98 113*  BUN 100* 102* 105* 105* 108* 96* 91*  CREATININE 3.89* 3.67* 3.83* 3.44* 3.21* 2.82* 2.93*  CALCIUM 9.5 9.8 9.5 9.6 9.6 9.9 9.7  PHOS  --  4.0 3.7 3.1 2.7 2.4* 3.6   CBC  Recent Labs Lab 08/21/15 0400 08/26/15 0601 08/27/15 0303  WBC 5.3 7.1 8.4  HGB 9.2* 9.7* 9.5*  HCT 28.4* 29.4* 30.4*  MCV 89.3 90.7 91.6  PLT 131* 166 164  Medications:    . allopurinol  200 mg Oral Daily  . carvedilol  3.125 mg Oral BID  . cefUROXime (ZINACEF)  IV  1.5 g Intravenous To SS-Surg  . darbepoetin (ARANESP) injection - NON-DIALYSIS  60 mcg Subcutaneous Q Fri-1800  . furosemide  80 mg Oral TID  . guaiFENesin  600 mg Oral BID  . hydrALAZINE  25 mg Oral BID  . hydroxychloroquine  200 mg Oral BID  . isosorbide mononitrate  30 mg Oral Daily  . multivitamin with minerals  1 tablet Oral Daily  . polyethylene glycol  17 g Oral Daily  . potassium chloride  40 mEq Oral BID  . sodium chloride flush   3 mL Intravenous Q12H  . terazosin  1 mg Oral QHS    Archie Patten, MD  PGY-2,  [facility] 08/27/2015 6:46 AM I have seen and examined this patient and agree with plan per Dr Lorenso Courier.  Lt AVF not I am not optimistic about it maturing.  Pt understands how to adjust lasix based on weight and fluid.  Note possible DC today and pt says he is ready.  Has FU Dr [name] 3/1 . Jalila Goodnough T,MD 08/27/2015 11:08 AM

## 2015-08-27 NOTE — Progress Notes (Signed)
Physical Therapy Treatment Patient Details Name: Bruce Cole MRN: [name] DOB: [birth year] Today's Date: 08/27/2015    History of Present Illness Patient is a 75 y/o male with hx of A-fib, gout, CHF, CKD presents with increasing SOB, leg swelling, and DOE. BNP at 204.1. Admitted with Acute on chronic combined systolic and diastolic CHF exacerbation.    PT Comments    Pt admitted with above diagnosis. Pt currently with functional limitations due to decreased functional activity tolerance. Pt participated well with therapy today. Pt still limited by fatigue and pain. Pt had fistula placed in L arm yesterday, educated pt on limited WB and activity with L UE until healed which limits his ability to perform sit to stands from low surfaces. Pt will continue to benefit from skilled PT to increase their independence and safety with mobility to allow discharge to the venue listed below.  Will continue to work on endurance and balance to get back to his baseline mobility status.    Follow Up Recommendations  Home health PT;Supervision - Intermittent     Equipment Recommendations  None recommended by PT    Recommendations for Other Services       Precautions / Restrictions Precautions Precautions: Fall Restrictions Weight Bearing Restrictions: No    Mobility  Bed Mobility Overal bed mobility: Needs Assistance Bed Mobility: Supine to Sit     Supine to sit: Modified independent (Device/Increase time);HOB elevated     General bed mobility comments: Increased time but no assist needed. Use of rail.  Transfers Overall transfer level: Needs assistance Equipment used: Rolling walker (2 wheeled) Transfers: Sit to/from Stand Sit to Stand: From elevated surface;Min guard         General transfer comment: Min Guard for safety. Pt needed increased surface height due to inability to put much weight through his L UE.   Ambulation/Gait Ambulation/Gait assistance: Min  guard Ambulation Distance (Feet): 100 Feet Assistive device: Rolling walker (2 wheeled) Gait Pattern/deviations: Step-through pattern;Decreased stride length;Trunk flexed;Wide base of support Gait velocity: decreased Gait velocity interpretation: Below normal speed for age/gender General Gait Details: Slow gait but steady with RW. Required occasional VC's for hand placement of walker.    Stairs            Wheelchair Mobility    Modified Rankin (Stroke Patients Only)       Balance Overall balance assessment: Needs assistance Sitting-balance support: No upper extremity supported;Feet supported Sitting balance-Leahy Scale: Good     Standing balance support: Bilateral upper extremity supported Standing balance-Leahy Scale: Fair Standing balance comment: reliant on RW for support. Able to stand with no AD to pull up pants but leans his legs into the bed to prevent LOB posteriorly.                     Cognition Arousal/Alertness: Awake/alert Behavior During Therapy: WFL for tasks assessed/performed Overall Cognitive Status: Within Functional Limits for tasks assessed                      Exercises      General Comments General comments (skin integrity, edema, etc.): Pt fatigues fairly quickly. Pt c/o of slight dizziness upon standing but stated that it resolved quickly. No drop in BP.      Pertinent Vitals/Pain Pain Assessment: 0-10 Pain Score: 4  Pain Location: hip, L wrist pain at incision site.   BP: 126/68 mmHg upon standing.     Home Living  Prior Function            PT Goals (current goals can now be found in the care plan section) Acute Rehab PT Goals Patient Stated Goal: to return home PT Goal Formulation: With patient Potential to Achieve Goals: Good Progress towards PT goals: Progressing toward goals    Frequency  Min 3X/week    PT Plan Current plan remains appropriate    Co-evaluation              End of Session Equipment Utilized During Treatment: Gait belt Activity Tolerance: Patient tolerated treatment well;Patient limited by fatigue Patient left: in chair;with call bell/phone within reach     Time: [id number] PT Time Calculation (min) (ACUTE ONLY): 21 min  Charges:  $Gait Training: 8-22 mins                    G Codes:      Colon Branch, SPT Colon Branch 08/27/2015, 4:59 PM

## 2015-08-28 ENCOUNTER — Telehealth: Payer: Self-pay | Admitting: Family

## 2015-08-28 DIAGNOSIS — R001 Bradycardia, unspecified: Secondary | ICD-10-CM

## 2015-08-28 LAB — RENAL FUNCTION PANEL
ALBUMIN: 3.6 g/dL (ref 3.5–5.0)
Anion gap: 13 (ref 5–15)
BUN: 90 mg/dL — AB (ref 6–20)
CHLORIDE: 96 mmol/L — AB (ref 101–111)
CO2: 31 mmol/L (ref 22–32)
Calcium: 9.6 mg/dL (ref 8.9–10.3)
Creatinine, Ser: 3.21 mg/dL — ABNORMAL HIGH (ref 0.61–1.24)
GFR calc Af Amer: 21 mL/min — ABNORMAL LOW (ref >60)
GFR calc non Af Amer: 18 mL/min — ABNORMAL LOW (ref >60)
GLUCOSE: 141 mg/dL — AB (ref 65–99)
POTASSIUM: 3.5 mmol/L (ref 3.5–5.1)
Phosphorus: 3.2 mg/dL (ref 2.5–4.6)
Sodium: 140 mmol/L (ref 135–145)

## 2015-08-28 LAB — PROTIME-INR
INR: 1.36 (ref 0.00–1.49)
Prothrombin Time: 16.9 seconds — ABNORMAL HIGH (ref 11.6–15.2)

## 2015-08-28 LAB — CBC
HEMATOCRIT: 29.6 % — AB (ref 39.0–52.0)
HEMOGLOBIN: 9.3 g/dL — AB (ref 13.0–17.0)
MCH: 29.2 pg (ref 26.0–34.0)
MCHC: 31.4 g/dL (ref 30.0–36.0)
MCV: 92.8 fL (ref 78.0–100.0)
Platelets: 151 10*3/uL (ref 150–400)
RBC: 3.19 MIL/uL — ABNORMAL LOW (ref 4.22–5.81)
RDW: 19.3 % — AB (ref 11.5–15.5)
WBC: 6.8 10*3/uL (ref 4.0–10.5)

## 2015-08-28 LAB — HEPARIN LEVEL (UNFRACTIONATED): Heparin Unfractionated: 0.44 IU/mL (ref 0.30–0.70)

## 2015-08-28 MED ORDER — CARVEDILOL 3.125 MG PO TABS: 3.1250 mg | ORAL_TABLET | Freq: Two times a day (BID) | ORAL | Status: DC

## 2015-08-28 MED ORDER — HYDROCODONE-ACETAMINOPHEN 5-325 MG PO TABS: 1.0000 | ORAL_TABLET | ORAL | Status: DC | PRN

## 2015-08-28 MED ORDER — FUROSEMIDE 80 MG PO TABS: 80.0000 mg | ORAL_TABLET | Freq: Three times a day (TID) | ORAL | Status: DC

## 2015-08-28 MED ORDER — OXYCODONE HCL 5 MG PO TABS
5.0000 mg | ORAL_TABLET | ORAL | Status: DC | PRN
Start: 2015-08-28 — End: 2015-09-11

## 2015-08-28 MED ORDER — WARFARIN SODIUM 2.5 MG PO TABS
12.5000 mg | ORAL_TABLET | Freq: Once | ORAL | Status: AC
  Administered 2015-08-28: 12.5 mg via ORAL
  Filled 2015-08-28: qty 1

## 2015-08-28 NOTE — Discharge Summary (Signed)
Pt got discharged to home, discharge instructions provided and patient showed understanding to it, IV taken out,Telemonitor DC,pt left unit in wheelchair with all of the belongings accompanied with a friend.

## 2015-08-28 NOTE — Progress Notes (Signed)
Physical Therapy Treatment Patient Details Name: Bruce Cole MRN: [name] DOB: 1941/05/18 Today's Date: 08/28/2015    History of Present Illness Patient is a 75 y/o male with hx of A-fib, gout, CHF, CKD presents with increasing SOB, leg swelling, and DOE. BNP at 204.1. Admitted with Acute on chronic combined systolic and diastolic CHF exacerbation.    PT Comments    Gait with standard cane today.  No LOB, but RW does give pt more relief from hip pain. Discussed as L arm feels better from procedure on 2/9, that he could return to RW if he continues to need support in gait. Pt reports he is supposed to have R hip surgery once he is able.  Pt referred to where he could purchase a sock aid, as he has lost his from L hip surgery. He does not feel he needs OT.  Follow Up Recommendations  Home health PT;Supervision - Intermittent     Equipment Recommendations  None recommended by PT    Recommendations for Other Services       Precautions / Restrictions Precautions Precautions: Fall Restrictions Weight Bearing Restrictions: No    Mobility  Bed Mobility   Bed Mobility: Supine to Sit     Supine to sit: Modified independent (Device/Increase time);HOB elevated     General bed mobility comments: Increased time but no assist needed. Use of rail.  Transfers Overall transfer level: Needs assistance Equipment used: None Transfers: Sit to/from Stand Sit to Stand: Min guard;Supervision         General transfer comment: Multiple stands, MIN/guard to S  Ambulation/Gait Ambulation/Gait assistance: Min guard Ambulation Distance (Feet): 80 Feet Assistive device: Straight cane Gait Pattern/deviations: Trunk flexed;Wide base of support;Decreased stride length Gait velocity: decreased Gait velocity interpretation: Below normal speed for age/gender General Gait Details: Amb with cane due to L UE discomfort form fistula placement yesterday.  No LOB, but mildly guarded due to R  hip arthritis.   Stairs            Wheelchair Mobility    Modified Rankin (Stroke Patients Only)       Balance   Sitting-balance support: No upper extremity supported Sitting balance-Leahy Scale: Good       Standing balance-Leahy Scale: Fair Standing balance comment: Able to stand without UE support                    Cognition Arousal/Alertness: Awake/alert Behavior During Therapy: WFL for tasks assessed/performed Overall Cognitive Status: Within Functional Limits for tasks assessed                      Exercises      General Comments General comments (skin integrity, edema, etc.): Pt unable to don socks.  Discussed AE and OT. He states he had OT from hip surgery and misplaced sock aid, but has reacher.  Pt does not feel he needs OT, but just needs to purchase a new sock aid.  Pt was informed where he could purchase.      Pertinent Vitals/Pain Pain Assessment: Faces Faces Pain Scale: Hurts little more Pain Location: R hip Pain Intervention(s): Limited activity within patient's tolerance;Monitored during session;Repositioned    Home Living                      Prior Function            PT Goals (current goals can now be found in the care plan section)  Acute Rehab PT Goals Patient Stated Goal: to return home PT Goal Formulation: With patient Time For Goal Achievement: 09/01/15 Potential to Achieve Goals: Good Progress towards PT goals: Progressing toward goals    Frequency  Min 3X/week    PT Plan Current plan remains appropriate    Co-evaluation             End of Session Equipment Utilized During Treatment: Gait belt Activity Tolerance: Patient tolerated treatment well;Patient limited by fatigue Patient left: in chair;with call bell/phone within reach     Time: 0856-0918 PT Time Calculation (min) (ACUTE ONLY): 22 min  Charges:  $Gait Training: 8-22 mins                    G Codes:      Nyasiah Moffet  LUBECK 08/28/2015, 10:52 AM

## 2015-08-28 NOTE — Discharge Summary (Signed)
Physician Discharge Summary  Bruce Cole [name] DOB: 05/28/1941 DOA: 08/17/2015  PCP: Nance Pear., NP  Admit date: 08/17/2015 Discharge date: 08/28/2015  Time spent: 45 minutes  Recommendations for Outpatient Follow-up:  Patient will be discharged to home with home health PT.  Patient will need to follow up with primary care provider within one week of discharge, repeat CBC and BMP.  Follow up with nephrology, Dr. [name].  Follow up with Dr. Donnetta Hutching, vascular surgery, in one month.  Patient should continue medications as prescribed.  Patient should follow a heart healthy diet.   Discharge Diagnoses:  Acute on chronic combined systolic and diastolic CHF exacerbation (HCC) Acute renal failure on chronic kidney disease stage IV Pleural effusion Diabetes mellitus type 2 Atrial fibrillation on Coumadin Hypertension Question of acute urinary retention Hypokalemia  Discharge Condition: Stable  Diet recommendation: Heart healthy  Filed Weights   08/26/15 0436 08/27/15 0519 08/28/15 0537  Weight: 127.098 kg (280 lb 3.2 oz) 126.145 kg (278 lb 1.6 oz) 127.506 kg (281 lb 1.6 oz)    History of present illness:  on 08/18/2015 by Dr. Fuller Plan Mr. Dellaquila is a 75 year old male with a past medical history significant for HTN, A. fib on Coumadin, CHF, and CKD; who presented with increasing shortness of breath, leg swelling, and dyspnea on exertion over the last week. Patient was seen by his primary care provider today and referred to the emergency department. Patient notes a 7- 8 pound weight gain despite being on Lasix 80 mg twice a day. Patient reports having a cough which is nonproductive. Denies any chest pain, nausea, vomiting, fever, or chills. Mr. Criste also states that he's had difficulty urinating over this last week as well. Review of patient's last echocardiogram back in 03/2013 showed EF of 45-50% with grade 2 diastolic dysfunction. He notes that he's had  congestive heart failure for 3-4 years, but had been treated as an outpatient.  Upon admission to the emergency department patient was evaluated and seen have elevated BNP at 204.1. Creatinine function have progressively deteriorated and was noted to be 2.59 on 08/04/2015, and today elevated at 3.64. Patient had been given 30 mg of IV Lasix on the 29th when he went to the emergency department, and then again on the 30th symptoms were not improved prior to being transferred to [facility] for admission.  Hospital Course:  Acute on chronic combined systolic and diastolic CHF exacerbation ([place]) -Patient with increased weight gain not relieved by po Lasix. Patient with +2 pitting edema lower extremity.  -BNP elevated at 204.1 on admission. Last EF 45-50% showing combined systolic and diastolic dysfunction back in 03/2013. -Cardiology consulted and appreciated -Follow intake/output, daily weight, elevate legs, salt and fluid restriction. -Lasix switched to oral, Lasix 80 mg 3 times a day. -UOP 1075cc/24 hours -Patient will need to be followed by nephrology for appropriately lasix dosing  Acute renal failure on chronic kidney disease stage IV -Patient's kidney function has progressively worsened over the last 2 weeks from 2.59 on 1/17 to 3.64 on admission. -Creatinine 3.21 today -Renal ultrasound showed medical renal disease. -Nephrology consulted and appreciated -Vascular surgery consulted for Left AVF placed 08/26/15 -Follow up with vascular surgery as well as nephrology as an outpatient  Pleural effusion -Right-sided pleural effusion status post thoracentesis with removal of 1.5 L. -Likely effusion secondary to CHF.  Diabetes mellitus type 2 -Controlled diabetes mellitus type 2, hemoglobin A1c is 6.7 on 08/04/15. -Continue ISS and CBG monitoring  Atrial fibrillation on  Coumadin -CHA2DS2-VASc score 4. -INR 1.36 -Coumadin stopped, will be on IV heparin anticipating fistula  creation. -Continue coumadin -Have INR checked at Coumadin clinic on 08/31/15  Hypertension -Continue amlodipine, Coreg, and other blood pressure medications as listed above  Question of acute urinary retention -Resolved   Hypokalemia -Resolved, continue to monitor BMP -Likely secondary to diuresis  Procedures  Left AVF   Consults  Cardiology Nephrology Vascular surgery  Discharge Exam: Filed Vitals:   08/27/15 2044 08/28/15 0537  BP: 115/65 118/62  Pulse: 56 52  Temp: 98.1 F (36.7 C) 98.3 F (36.8 C)  Resp: 18 18   Exam  General: Well developed, well nourished, NAD  HEENT: NCAT, mucous membranes moist.   Cardiovascular: S1 S2 auscultated, 2/6 SEM, RRR  Respiratory: Clear to auscultation   Abdomen: Soft, nontender, nondistended, + bowel sounds  Extremities: warm dry without cyanosis clubbing. + edema in LE B/L  Neuro: AAOx3, nonfocal  Psych: Normal affect and demeanor, pleasant  Discharge Instructions      Discharge Instructions    Discharge instructions    Complete by:  As directed   Patient will be discharged to home with home health PT.  Patient will need to follow up with primary care provider within one week of discharge, repeat CBC and BMP.  Follow up with nephrology, Dr. [name].  Follow up with Dr. Donnetta Hutching, vascular surgery, in one month.  Patient should continue medications as prescribed.  Patient should follow a heart healthy diet.            Medication List    STOP taking these medications        amLODipine 10 MG tablet  Commonly known as:  NORVASC     levofloxacin 250 MG tablet  Commonly known as:  LEVAQUIN     metolazone 2.5 MG tablet  Commonly known as:  ZAROXOLYN     spironolactone 25 MG tablet  Commonly known as:  ALDACTONE      TAKE these medications        albuterol (2.5 MG/3ML) 0.083% nebulizer solution  Commonly known as:  PROVENTIL  Take 2.5 mg by nebulization every 4 (four) hours.     allopurinol 100 MG  tablet  Commonly known as:  ZYLOPRIM  TAKE 2 TABLETS BY MOUTH EVERY DAY     carvedilol 3.125 MG tablet  Commonly known as:  COREG  Take 1 tablet (3.125 mg total) by mouth 2 (two) times daily.     colchicine 0.6 MG tablet  TAKE 2 TABLETS BY MOUTH AT THE START OF GOUT FLARE AND TAKE 1 TABLET 3 HOURS LATER; MAX 3 TABS PER GOUT ATTACK     fluticasone 50 MCG/ACT nasal spray  Commonly known as:  FLONASE  Place 2 sprays into both nostrils daily.     furosemide 80 MG tablet  Commonly known as:  LASIX  Take 1 tablet (80 mg total) by mouth 3 (three) times daily.     glucose blood test strip  Use as to check blood sugar 1 to 2 times a day.  DX  E11.8     guaiFENesin 600 MG 12 hr tablet  Commonly known as:  MUCINEX  Take 600 mg by mouth 2 (two) times daily.     hydrALAZINE 50 MG tablet  Commonly known as:  APRESOLINE  TAKE 1 TABLET BY MOUTH THREE TIMES DAILY     HYDROcodone-acetaminophen 5-325 MG tablet  Commonly known as:  NORCO/VICODIN  Take 1-2 tablets by mouth every 6 (  six) hours as needed for moderate pain.     hydroxychloroquine 200 MG tablet  Commonly known as:  PLAQUENIL  Take 200 mg by mouth 2 (two) times daily.     isosorbide mononitrate 30 MG 24 hr tablet  Commonly known as:  IMDUR  TAKE 1 TABLET BY MOUTH EVERY DAY     MUCINEX NASAL SPRAY MOISTURE NA  Place 1 spray into both nostrils 4 (four) times daily as needed (congestion).     multivitamin with minerals Tabs tablet  Take 1 tablet by mouth daily.     ondansetron 4 MG disintegrating tablet  Commonly known as:  ZOFRAN ODT  Take 1 tablet (4 mg total) by mouth every 8 (eight) hours as needed for nausea or vomiting.     potassium chloride SA 20 MEQ tablet  Commonly known as:  K-DUR,KLOR-CON  TAKE 1 TABLET BY MOUTH THREE TIMES DAILY     PRESCRIPTION MEDICATION  Inhale 1 vial into the lungs every 4 (four) hours as needed (shortness of breath or wheezing).     terazosin 1 MG capsule  Commonly known as:  HYTRIN   TAKE 1 CAPSULE BY MOUTH EVERY NIGHT AT BEDTIME     warfarin 7.5 MG tablet  Commonly known as:  COUMADIN  TAKE 1 TABLET BY MOUTH EVERY DAILY AS DIRECTED PER COUMADIN CLINIC       Allergies  Allergen Reactions  . Ace Inhibitors Other (See Comments)    Worsening renal insufficiency   Follow-up Information    Follow up with Glori Bickers, MD On 09/11/2015.   Specialty:  Cardiology   Why:  at 220 pm for post hospital follow up. Please bring all of your medications to your visit. The code for pt parking is [phone].   Contact information:   [place] [address] [phone]       Follow up with Estanislado Emms, MD On 09/16/2015.   Specialty:  Nephrology   Why:  at 9:45am for a hospital follow up   Contact information:   [place] [address] [phone]       Follow up with Curt Jews, MD In 4 weeks.   Specialties:  Vascular Surgery, Cardiology   Why:  Office will call you to arrange your appt (sent)   Contact information:   [place] [address] [phone]       Follow up with Nance Pear., NP. Schedule an appointment as soon as possible for a visit in 1 week.   Specialty:  Internal Medicine   Why:  Hospital follow up   Contact information:   [place] [address] [phone]        The results of significant diagnostics from this hospitalization (including imaging, microbiology, ancillary and laboratory) are listed below for reference.    Significant Diagnostic Studies: Dg Chest 1 View  08/19/2015  CLINICAL DATA:  Status post right-sided thoracentesis. EXAM: CHEST 1 VIEW COMPARISON:  August 17, 2015. FINDINGS: Stable cardiomegaly is noted. No pneumothorax or significant pleural effusion is noted. No acute pulmonary disease is noted. Bony thorax is unremarkable. IMPRESSION: Stable cardiomegaly.  No other significant abnormality seen.  Electronically Signed   By: Marijo Conception, M.D.   On: 08/19/2015 14:22   Dg Chest 2 View  08/17/2015  CLINICAL DATA:  Shortness of breath for 1 week. EXAM: CHEST  2 VIEW COMPARISON:  08/12/2015. FINDINGS: Development of small right pleural effusion. Associated right basilar opacity. Cardiomegaly is stable. No pulmonary edema or pneumothorax. Osseous structures are unchanged. IMPRESSION: Developed of small right pleural effusion. Associated right basilar opacity may reflect compressive atelectasis. Pneumonia could have a similar appearance in the appropriate clinical setting. Cardiomegaly is stable. Electronically Signed   By: Jeb Levering M.D.   On: 08/17/2015 01:08   Dg Chest 2 View  08/12/2015  CLINICAL DATA:  Shortness of breath for 2 days. Difficulty urinating. Initial encounter. EXAM: CHEST  2 VIEW COMPARISON:  PA and lateral chest 04/15/2015. FINDINGS: There is marked cardiomegaly without pulmonary edema. No pneumothorax or pleural effusion is identified. IMPRESSION: Cardiomegaly without acute disease. Electronically Signed   By: Inge Rise M.D.   On: 08/12/2015 20:05   Ct Chest Wo Contrast  08/18/2015  CLINICAL DATA:  Follow-up right pleural effusion. History of atrial fibrillation. EXAM: CT CHEST WITHOUT CONTRAST TECHNIQUE: Multidetector CT imaging of the chest was performed following the standard protocol without IV contrast. COMPARISON:  Chest radiograph 08/17/2015 and abdominal CT 05/01/2015 FINDINGS: Mediastinum/Lymph Nodes: No gross abnormality in the visualized thyroid tissue. No significant mediastinal or hilar lymphadenopathy but limited evaluation without intravenous contrast. Enlargement of the mid ascending thoracic aorta measuring up to 4.1 cm. Aneurysm of the proximal descending thoracic aorta measuring up to 4.8 cm. The mid descending thoracic aorta measures 4.0. The distal descending thoracic aorta measures 3.4 cm. Heart size is enlarged. No significant pericardial fluid.  Lungs/Pleura: There is a moderate sized right pleural effusion with extensive volume loss throughout the right lower lobe. Minimal aeration in the right lower lobe. Scattered ground-glass densities in the left lower lobe probably represent areas of atelectasis. No clear evidence for pulmonary edema. Upper abdomen: Limited evaluation of the upper abdomen on this non contrast examination. Fullness in left adrenal gland probably related to an adenoma. Probable exophytic cyst involving the left kidney pole Musculoskeletal: Laminectomy changes in lower thoracic spine. Multilevel degenerative disc disease in the thoracic spine. IMPRESSION: Moderate-sized right pleural effusion with near complete collapse of the right lower lobe. Limited evaluation of the right lower lobe due to volume loss. Cardiomegaly. Aneurysm of the descending thoracic aorta, measuring up to 4.8 cm. Aneurysm of the ascending thoracic aorta measuring up to 4.1 cm. Recommend semi-annual imaging followup by CTA or MRA and referral to cardiothoracic surgery if not already obtained. This recommendation follows 2010 ACCF/AHA/AATS/ACR/ASA/SCA/SCAI/SIR/STS/SVM Guidelines for the Diagnosis and Management of Patients With Thoracic Aortic Disease. Circulation. 2010; [id number] Electronically Signed   By: Markus Daft M.D.   On: 08/18/2015 18:39   US Renal  08/18/2015  CLINICAL DATA:  Acute kidney injury, known chronic renal insufficiency. EXAM: RENAL / URINARY TRACT ULTRASOUND COMPLETE COMPARISON:  Noncontrast CT scan of the abdomen and pelvis dated May 01, 2015 and abdominal ultrasound of April 08, 2013 FINDINGS: Right Kidney: Length: 14.2 cm. The renal cortical echotexture is approximately equal to that of the liver. There is no hydronephrosis. There are multiple simple appearing cortical cysts. The largest cyst measures 2.5 x 1.9 x 2.2 cm. A midpole cyst measures 2.1 x 1.8 x 1.7 cm. Left Kidney: Length: 12.8 cm. The renal cortical echotexture is  similar to that on the left. There are multiple cortical cysts. One in the upper pole measures 2.8 x 2.8 x 2.6 cm. A second cyst in the midpole measures 3.1 x 2.5  x 3 cm. There is no hydronephrosis. Bladder: The urinary bladder is decompressed due to postvoid state. There is a moderate-sized right pleural effusion. IMPRESSION: 1. Increased renal cortical echotexture which is been previously demonstrated consistent with medical renal disease. No significant renal cortical atrophy is observed. There are multiple simple appearing cortical cysts bilaterally. There is no hydronephrosis. 2. There is a moderate-sized right pleural effusion. Electronically Signed   By: David  Martinique M.D.   On: 08/18/2015 11:27   Dg Chest 2v Repeat Same Day  08/17/2015  CLINICAL DATA:  Increased shortness of breath and leg swelling. EXAM: CHEST  2 VIEW COMPARISON:  08/17/2015 and 08/12/2015 and 04/15/2015 FINDINGS: Again noted are right basilar chest densities and suspect elevation of the right hemidiaphragm. There may be compressive atelectasis or airspace disease along the medial right lung base. Left lung remains clear. Heart and mediastinum are stable. Stable mild cardiomegaly. IMPRESSION: Persistent densities at the right lung base, particularly along the medial right lung base. Findings could represent a combination of consolidation and pleural fluid. This represents a significant change from the examination in 2016. Recommend follow-up to ensure improved aeration at the right lung base. If aeration does not improve, recommend further characterization with CT. Electronically Signed   By: Markus Daft M.D.   On: 08/17/2015 16:13   US Thoracentesis Asp Pleural Space W/img Guide  08/19/2015  INDICATION: CHF, chronic kidney disease, dyspnea, right pleural effusion. Request is made for diagnostic and therapeutic right thoracentesis. EXAM: ULTRASOUND GUIDED DIAGNOSTIC AND THERAPEUTIC RIGHT THORACENTESIS MEDICATIONS: None. COMPLICATIONS:  None immediate. PROCEDURE: An ultrasound guided thoracentesis was thoroughly discussed with the patient and questions answered. The benefits, risks, alternatives and complications were also discussed. The patient understands and wishes to proceed with the procedure. Written consent was obtained. Ultrasound was performed to localize and mark an adequate pocket of fluid in the right chest. The area was then prepped and draped in the normal sterile fashion. 1% Lidocaine was used for local anesthesia. Under ultrasound guidance a Safe-T-Centesis catheter was introduced. Thoracentesis was performed. The catheter was removed and a dressing applied. FINDINGS: A total of approximately 1.5 liters of hazy, yellow fluid was removed. Samples were sent to the laboratory as requested by the clinical team. IMPRESSION: Successful ultrasound guided diagnostic and therapeutic right thoracentesis yielding 1.5 liters of pleural fluid. Read by: Rowe Robert, PA-C Electronically Signed   By: Jerilynn Mages.  Shick M.D.   On: 08/19/2015 14:08    Microbiology: Recent Results (from the past 240 hour(s))  Culture, body fluid-bottle     Status: None   Collection Time: 08/19/15  1:45 PM  Result Value Ref Range Status   Specimen Description FLUID RIGHT PLEURAL  Final   Special Requests BOTTLES DRAWN AEROBIC AND ANAEROBIC 10CCS  Final   Culture NO GROWTH 5 DAYS  Final   Report Status 08/24/2015 FINAL  Final  Gram stain     Status: None   Collection Time: 08/19/15  1:45 PM  Result Value Ref Range Status   Specimen Description FLUID RIGHT PLEURAL  Final   Special Requests NONE  Final   Gram Stain   Final    FEW WBC PRESENT, PREDOMINANTLY PMN NO ORGANISMS SEEN    Report Status 08/19/2015 FINAL  Final     Labs: Basic Metabolic Panel:  Recent Labs Lab 08/24/15 0433 08/25/15 0356 08/26/15 0601 08/27/15 0303 08/28/15 0425  NA 138 138 141 141 140  K 3.0* 3.4* 3.2* 3.6 3.5  CL 94* 96* 97* 99*  96*  CO2 30 29 31 31 31   GLUCOSE 103*  106* 98 113* 141*  BUN 105* 108* 96* 91* 90*  CREATININE 3.44* 3.21* 2.82* 2.93* 3.21*  CALCIUM 9.6 9.6 9.9 9.7 9.6  PHOS 3.1 2.7 2.4* 3.6 3.2   Liver Function Tests:  Recent Labs Lab 08/24/15 0433 08/25/15 0356 08/26/15 0601 08/27/15 0303 08/28/15 0425  ALBUMIN 3.8 3.8 3.9 3.7 3.6   No results for input(s): LIPASE, AMYLASE in the last 168 hours. No results for input(s): AMMONIA in the last 168 hours. CBC:  Recent Labs Lab 08/26/15 0601 08/27/15 0303 08/28/15 0430  WBC 7.1 8.4 6.8  HGB 9.7* 9.5* 9.3*  HCT 29.4* 30.4* 29.6*  MCV 90.7 91.6 92.8  PLT 166 164 151   Cardiac Enzymes: No results for input(s): CKTOTAL, CKMB, CKMBINDEX, TROPONINI in the last 168 hours. BNP: BNP (last 3 results)  Recent Labs  08/12/15 1930 08/16/15 2350 08/17/15 1535  BNP 178.6* 174.2* 204.1*    ProBNP (last 3 results) No results for input(s): PROBNP in the last 8760 hours.  CBG: No results for input(s): GLUCAP in the last 168 hours.     SignedCristal Ford  [facility] 08/28/2015, 11:26 AM

## 2015-08-28 NOTE — Progress Notes (Signed)
[place] services arranged with [facility] as requested and will see the patient on Monday Feb 13,2017; Mindi Slicker [facility] [phone]

## 2015-08-28 NOTE — Telephone Encounter (Signed)
Please contact pt to arrange TCM follow up.  

## 2015-08-30 ENCOUNTER — Other Ambulatory Visit: Payer: Self-pay | Admitting: Family

## 2015-08-30 DIAGNOSIS — E1122 Type 2 diabetes mellitus with diabetic chronic kidney disease: Secondary | ICD-10-CM | POA: Diagnosis not present

## 2015-08-30 DIAGNOSIS — Z7901 Long term (current) use of anticoagulants: Secondary | ICD-10-CM | POA: Diagnosis not present

## 2015-08-30 DIAGNOSIS — I5042 Chronic combined systolic (congestive) and diastolic (congestive) heart failure: Secondary | ICD-10-CM | POA: Diagnosis not present

## 2015-08-30 DIAGNOSIS — I482 Chronic atrial fibrillation: Secondary | ICD-10-CM | POA: Diagnosis not present

## 2015-08-30 DIAGNOSIS — Z96642 Presence of left artificial hip joint: Secondary | ICD-10-CM | POA: Diagnosis not present

## 2015-08-30 DIAGNOSIS — N184 Chronic kidney disease, stage 4 (severe): Secondary | ICD-10-CM | POA: Diagnosis not present

## 2015-08-30 DIAGNOSIS — Z7951 Long term (current) use of inhaled steroids: Secondary | ICD-10-CM | POA: Diagnosis not present

## 2015-08-30 DIAGNOSIS — M109 Gout, unspecified: Secondary | ICD-10-CM | POA: Diagnosis not present

## 2015-08-30 DIAGNOSIS — Z96651 Presence of right artificial knee joint: Secondary | ICD-10-CM | POA: Diagnosis not present

## 2015-08-30 DIAGNOSIS — R262 Difficulty in walking, not elsewhere classified: Secondary | ICD-10-CM | POA: Diagnosis not present

## 2015-08-30 DIAGNOSIS — M069 Rheumatoid arthritis, unspecified: Secondary | ICD-10-CM | POA: Diagnosis not present

## 2015-08-30 DIAGNOSIS — I130 Hypertensive heart and chronic kidney disease with heart failure and stage 1 through stage 4 chronic kidney disease, or unspecified chronic kidney disease: Secondary | ICD-10-CM | POA: Diagnosis not present

## 2015-08-31 ENCOUNTER — Telehealth: Payer: Self-pay | Admitting: Family

## 2015-08-31 ENCOUNTER — Telehealth: Payer: Self-pay | Admitting: Emergency Medicine

## 2015-08-31 ENCOUNTER — Telehealth: Payer: Self-pay | Admitting: *Deleted

## 2015-08-31 ENCOUNTER — Ambulatory Visit (INDEPENDENT_AMBULATORY_CARE_PROVIDER_SITE_OTHER): Payer: Medicare Other | Admitting: Family

## 2015-08-31 ENCOUNTER — Encounter: Payer: Self-pay | Admitting: Family

## 2015-08-31 VITALS — BP 106/52 | HR 47 | Temp 97.9°F | Resp 20 | Ht 74.0 in | Wt 283.8 lb

## 2015-08-31 DIAGNOSIS — I482 Chronic atrial fibrillation, unspecified: Secondary | ICD-10-CM

## 2015-08-31 DIAGNOSIS — D649 Anemia, unspecified: Secondary | ICD-10-CM

## 2015-08-31 DIAGNOSIS — Z5181 Encounter for therapeutic drug level monitoring: Secondary | ICD-10-CM

## 2015-08-31 DIAGNOSIS — J948 Other specified pleural conditions: Secondary | ICD-10-CM

## 2015-08-31 DIAGNOSIS — N184 Chronic kidney disease, stage 4 (severe): Secondary | ICD-10-CM | POA: Diagnosis not present

## 2015-08-31 DIAGNOSIS — I5042 Chronic combined systolic (congestive) and diastolic (congestive) heart failure: Secondary | ICD-10-CM | POA: Diagnosis not present

## 2015-08-31 DIAGNOSIS — N19 Unspecified kidney failure: Secondary | ICD-10-CM

## 2015-08-31 DIAGNOSIS — J90 Pleural effusion, not elsewhere classified: Secondary | ICD-10-CM

## 2015-08-31 LAB — BASIC METABOLIC PANEL
BUN: 93 mg/dL — AB (ref 6–23)
CHLORIDE: 100 meq/L (ref 96–112)
CO2: 34 meq/L — AB (ref 19–32)
CREATININE: 3.92 mg/dL — AB (ref 0.40–1.50)
Calcium: 10.3 mg/dL (ref 8.4–10.5)
GFR: 19.51 mL/min — ABNORMAL LOW (ref >60.00)
GLUCOSE: 115 mg/dL — AB (ref 70–99)
Potassium: 4.7 mEq/L (ref 3.5–5.1)
Sodium: 141 mEq/L (ref 135–145)

## 2015-08-31 LAB — POCT INR: INR: 1.8

## 2015-08-31 MED ORDER — ONDANSETRON HCL 4 MG PO TABS: 4.0000 mg | ORAL_TABLET | Freq: Three times a day (TID) | ORAL | Status: DC | PRN

## 2015-08-31 NOTE — Patient Instructions (Addendum)
Please complete lab work prior to leaving. Take 1.5 tabs of coumadin tonight, then one tablet nightly.  Follow up in coumadin clinic in 1 week.

## 2015-08-31 NOTE — Assessment & Plan Note (Signed)
Pt is nearing dialysis.  He continues to have nausea (presumed secondary to uremia).  Rx sent for prn zofran.  Pt is instructed to keep upcoming apt with nephrology.  Volume status appears stable at this time.  He will continue lasix.

## 2015-08-31 NOTE — Telephone Encounter (Signed)
-----   Message from Debbrah Alar, NP sent at 08/31/2015  3:26 PM EST ----- Please let patient know that his kidney function remains impaired.  I reviewed his hospital record again and see the the hospital doctor would like for him to have a follow up blood count as well.  Lets have him return to the lab at his convenience in the next week please.  Dx anemia.

## 2015-08-31 NOTE — Telephone Encounter (Signed)
Notified pt. Scheduled lab appt for 09/07/15, future order entered.

## 2015-08-31 NOTE — Assessment & Plan Note (Signed)
Advised pt to keep his upcoming apt with Dr. Jeffie Pollock. Continue lasix, volume status is stable.

## 2015-08-31 NOTE — Telephone Encounter (Signed)
Caller name: Dan Europe Relationship to patient:physical therapist Can be [phone] Pharmacy:  Reason for call:Wants a verbal order to see the patient 2 times a week for 6 weeks for strengthening and balance and he needs to walk to get his endurance up/

## 2015-08-31 NOTE — Progress Notes (Signed)
Subjective:    Patient ID: Bruce Cole, male    DOB: October 29, 1940, 75 y.o.   MRN: [id number]  HPI  Bruce Cole is a 75 yr old male who presents today for TCM/Hospital follow up visit.  Pt was admitted to [facility] 08/17/15-08/28/15.   Discharge summary is reviewed.  Patient was treated for acute on chronic systolic/diastolic heart failure exacerbation as well as acute on chronic renal failure stage IV.   He was found to have a right pleural effusion which was tapped during admission yielding 1.5 L of fluid.  Following IV diruesis and tap, pt reports that his SOB improved.  He reports that his does get winded with exertion, but ok at rest and overall feeling better since discharge.  He reports some generalized weakness, but has begun working with [facility] PT.   He also had a L AVF placed on 08/26/15 and has follow up scheduled with vascular surgery and nephrology.    Wt Readings from Last 3 Encounters:  08/31/15 283 lb 12.8 oz (128.731 kg)  08/28/15 281 lb 1.6 oz (127.506 kg)  08/17/15 291 lb (131.997 kg)   Reports that he is continuing lasix 80mg  TID and has had good urinary output. He does report some + nausea.   Review of Systems Good urine output.    Past Medical History  Diagnosis Date  . Nephrolithiasis   . Arthritis     rheumatoid  . Colon polyp 2000  . Atrial fibrillation ([place])   . Unspecified essential hypertension   . Gout   . CHF (congestive heart failure) ([place])     Social History   Social History  . Marital Status: Single    Spouse Name: N/A  . Number of Children: 3  . Years of Education: N/A   Occupational History  .     Social History Main Topics  . Smoking status: Never Smoker   . Smokeless tobacco: Never Used  . Alcohol Use: Yes     Comment: occasional  . Drug Use: No  . Sexual Activity: Not on file   Other Topics Concern  . 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 Spine surgery      x 2  . Joint replacement      Total L-Hip replacement, Right Knee 10/20/09  . Cholecystectomy  1994  . Left and right heart catheterization with coronary angiogram N/A 02/22/2013    Procedure: LEFT AND RIGHT HEART CATHETERIZATION WITH CORONARY ANGIOGRAM;  Surgeon: Jolaine Artist, MD;  Location: [facility] CATH LAB;  Service: Cardiovascular;  Laterality: N/A;  . Av fistula placement Left 08/26/2015    Procedure: LEFT RADIOCEPHALIC FISTULA CREATION;  Surgeon: Rosetta Posner, MD;  Location: [facility] OR;  Service: Vascular;  Laterality: Left;    Family History  Problem Relation Age of Onset  . Hypertension Mother   . Arthritis Mother     ?RA  . Hypertension Father     Allergies  Allergen Reactions  . Ace Inhibitors Other (See Comments)    Worsening renal insufficiency    Current Outpatient Prescriptions on File Prior to Visit  Medication Sig Dispense Refill  . albuterol (PROVENTIL) (2.5 MG/3ML) 0.083% nebulizer solution Take 2.5 mg by nebulization every 4 (four) hours.    [name] allopurinol (ZYLOPRIM) 100 MG tablet TAKE 2 TABLETS BY MOUTH EVERY DAY 180 tablet 0  . carvedilol (  COREG) 3.125 MG tablet Take 1 tablet (3.125 mg total) by mouth 2 (two) times daily. 60 tablet 0  . colchicine 0.6 MG tablet TAKE 2 TABLETS BY MOUTH AT THE START OF GOUT FLARE AND TAKE 1 TABLET 3 HOURS LATER; MAX 3 TABS PER GOUT ATTACK 30 tablet 0  . fluticasone (FLONASE) 50 MCG/ACT nasal spray Place 2 sprays into both nostrils daily. (Patient taking differently: Place 2 sprays into both nostrils daily as needed (seasonal allergies). ) 16 g 6  . furosemide (LASIX) 80 MG tablet Take 1 tablet (80 mg total) by mouth 3 (three) times daily. 90 tablet 0  . hydrALAZINE (APRESOLINE) 50 MG tablet TAKE 1 TABLET BY MOUTH THREE TIMES DAILY (Patient taking differently: TAKE 1 TABLET BY MOUTH TWO TIMES DAILY) 270 tablet 1  . hydroxychloroquine (PLAQUENIL) 200 MG tablet Take 200 mg by mouth 2 (two) times daily.    .  isosorbide mononitrate (IMDUR) 30 MG 24 hr tablet TAKE 1 TABLET BY MOUTH EVERY DAY 90 tablet 0  . Multiple Vitamin (MULTIVITAMIN WITH MINERALS) TABS tablet Take 1 tablet by mouth daily.    . ondansetron (ZOFRAN ODT) 4 MG disintegrating tablet Take 1 tablet (4 mg total) by mouth every 8 (eight) hours as needed for nausea or vomiting. 12 tablet 1  . oxyCODONE (ROXICODONE) 5 MG immediate release tablet Take 1 tablet (5 mg total) by mouth every 4 (four) hours as needed for severe pain. 30 tablet 0  . potassium chloride SA (K-DUR,KLOR-CON) 20 MEQ tablet TAKE 1 TABLET BY MOUTH THREE TIMES DAILY (Patient taking differently: TAKE 1 TABLET BY MOUTH TWO TIMES DAILY) 270 tablet 3  . terazosin (HYTRIN) 1 MG capsule TAKE 1 CAPSULE BY MOUTH EVERY NIGHT AT BEDTIME 30 capsule 5  . warfarin (COUMADIN) 7.5 MG tablet TAKE 1 TABLET BY MOUTH EVERY DAILY AS DIRECTED PER COUMADIN CLINIC (Patient taking differently: TAKE 1 TABLET BY MOUTH EVERY DAILY AT BEDTIME OR AS DIRECTED PER COUMADIN CLINIC) 40 tablet 3  . Oxymetazoline HCl (MUCINEX NASAL SPRAY MOISTURE NA) Place 1 spray into both nostrils 4 (four) times daily as needed (congestion). Reported on 08/31/2015     No current facility-administered medications on file prior to visit.    BP 106/52 mmHg  Pulse 47  Temp(Src) 97.9 F (36.6 C) (Oral)  Resp 20  Ht 6\' 2"  (1.88 m)  Wt 283 lb 12.8 oz (128.731 kg)  BMI 36.42 kg/m2  SpO2 100%       Objective:   Physical Exam  Constitutional: He is oriented to person, place, and time. He appears well-developed and well-nourished. No distress.  HENT:  Head: Normocephalic and atraumatic.  Cardiovascular: Normal rate and regular rhythm.   No murmur heard. Pulmonary/Chest: Effort normal. No respiratory distress. He has no wheezes. He has no rales.  Very mild decreased breath sounds at the right base  Musculoskeletal: He exhibits no edema.  2+ bilateral LE edema  Neurological: He is alert and oriented to person, place, and  time.  Skin: Skin is warm and dry.  Psychiatric: He has a normal mood and affect. His behavior is normal. Thought content normal.          Assessment & Plan:

## 2015-08-31 NOTE — Telephone Encounter (Signed)
Left detailed message on Bruce Cole's voicemail.

## 2015-08-31 NOTE — Telephone Encounter (Signed)
Yes please

## 2015-08-31 NOTE — Progress Notes (Signed)
Notified pt. Appt scheduled and order entered.

## 2015-08-31 NOTE — Assessment & Plan Note (Deleted)
Pt is nearing dialysis.  He continues to have nausea (presumed secondary to uremia).  Rx sent for prn zofran.  Pt is instructed to keep upcoming apt with nephrology.  Volume status appears stable at this time.  He will continue lasix.

## 2015-08-31 NOTE — Telephone Encounter (Signed)
Noted, value is unchanged from prior.

## 2015-08-31 NOTE — Progress Notes (Signed)
Pre visit review using our clinic review tool, if applicable. No additional management support is needed unless otherwise documented below in the visit note. 

## 2015-08-31 NOTE — Telephone Encounter (Signed)
Unable to reach patient at time of call.  Left message for patient to return call when available.   

## 2015-08-31 NOTE — Assessment & Plan Note (Signed)
On exam, suspect small pleural effusion.  Monitor clinically.

## 2015-08-31 NOTE — Assessment & Plan Note (Signed)
INR 1.8 today. Advised pt to take coumadin 7.5 mg 1.5 tab today, then 1 tab daily, follow up with coumadin clinic in 1 week.

## 2015-08-31 NOTE — Telephone Encounter (Signed)
Received fax for PA on Ondansetron ODT/SLS 02/13

## 2015-08-31 NOTE — Telephone Encounter (Signed)
Bruce Cole called from [facility] with critical BUN of 93 on this patient.Marland KitchenMarland Kitchen

## 2015-09-01 NOTE — Telephone Encounter (Signed)
Unable to reach patient at time of call.  Left message for patient to return call when available.   

## 2015-09-03 ENCOUNTER — Other Ambulatory Visit: Payer: Self-pay

## 2015-09-03 DIAGNOSIS — M069 Rheumatoid arthritis, unspecified: Secondary | ICD-10-CM | POA: Diagnosis not present

## 2015-09-03 DIAGNOSIS — I482 Chronic atrial fibrillation: Secondary | ICD-10-CM | POA: Diagnosis not present

## 2015-09-03 DIAGNOSIS — I130 Hypertensive heart and chronic kidney disease with heart failure and stage 1 through stage 4 chronic kidney disease, or unspecified chronic kidney disease: Secondary | ICD-10-CM | POA: Diagnosis not present

## 2015-09-03 DIAGNOSIS — I5042 Chronic combined systolic (congestive) and diastolic (congestive) heart failure: Secondary | ICD-10-CM | POA: Diagnosis not present

## 2015-09-03 DIAGNOSIS — N184 Chronic kidney disease, stage 4 (severe): Secondary | ICD-10-CM | POA: Diagnosis not present

## 2015-09-03 DIAGNOSIS — Z79899 Other long term (current) drug therapy: Secondary | ICD-10-CM

## 2015-09-03 DIAGNOSIS — Z748 Other problems related to care provider dependency: Secondary | ICD-10-CM

## 2015-09-03 DIAGNOSIS — E1122 Type 2 diabetes mellitus with diabetic chronic kidney disease: Secondary | ICD-10-CM | POA: Diagnosis not present

## 2015-09-03 DIAGNOSIS — I5040 Unspecified combined systolic (congestive) and diastolic (congestive) heart failure: Secondary | ICD-10-CM

## 2015-09-03 NOTE — Patient Outreach (Addendum)
[facility]) Care Management  09/03/2015  DEMEIR QUATTRONE [birth year] [name]   SUBJECTIVE:  Telephone call to patient regarding  EMMI heart failure RED referral.  HIPAA verified with patient. Reviewed EMMI follow up call with patient.  Patient showed weight gain of 2lbs from 09/01/15 to 09/02/15.  Patient states he took an extra, "fluid pill."  Patient's weight today is 280.  Patient reports he has followed up with his primary MD since his discharge from the hospital. Patient states he saw his primary provider on 08/31/15.  Patient states [facility] home health is following him for physical therapy. Patient states he has tele- monitoring for weight and blood pressure. Patient states the blood pressure monitor is not working correctly. Patient states he has a fistula in one of his arms which cannot be used to check blood pressure. Patient states his other arm does not give good blood pressure therefore his blood pressure is not registering correctly. Patient states he has not notified home health or anyone of this problem with the blood pressure monitor. This RNCM requested contact name and number for the home health agency and name.  RNCM contacted Johna Roles with [facility] to report patients blood pressure equipment not working correctly. Stanton Kidney states she will have patients care manager with [facility] return call.  Patient states he becomes short of breath after he eats. Patient states he feels like he is going to throw up after he eats. Patient states he reported this to his primary provider. Patient states he continues to take zofran as needed for nausea/vomiting. Patient states he has to measure his urine output and he is on a fluid restriction of 2 Liters.  Patient states he needs assistance with transportation to his doctor appointments. Patient states he has find some of his friends to take his to his appointments.  Patient states he is taking at least 10 medications. Patient  states he is unsure of what some of his medications are for.   ASSESSMENT: EMMI stroke RED referral.  Patient will benefit from referral to community case management for heart failure management and education, pharmacy for polypharmacy and medication education, and social work to assist with transportation needs.  Patient has appointments scheduled with: Dr. Haroldine Laws 09/11/15 Dr. [name], nephrology 09/16/15 Dr. Donnetta Hutching, vascular, patient states his instructions states Dr. Luther Parody office will call and schedule follow up appointment  PLAN; RNCM will refer patient to community case manager, social worker, and pharmacy.   [name] RN,BSN,CCM [facility] Telephonic  [phone]

## 2015-09-03 NOTE — Patient Outreach (Signed)
[facility]) Care Management  09/03/2015  ANIVAL SALADO 1940-07-29 [name]   SUBJECTIVE: Telephone call to Nena Jordan, nurse with [facility] home health. Notified Ms. Nelda Marseille that patient reports his blood pressure monitor is not registering his blood pressure correctly.  Patient states his blood pressure is difficulty to obtain.  Ms. Nelda Marseille states she will have nurse follow up with patient on tomorrow, 09/04/15.    PLAN;  No additional follow up needed from this RNCM at this time.   [name] RN,BSN,CCM [facility] Telephonic  [phone]

## 2015-09-04 DIAGNOSIS — M069 Rheumatoid arthritis, unspecified: Secondary | ICD-10-CM | POA: Diagnosis not present

## 2015-09-04 DIAGNOSIS — E1122 Type 2 diabetes mellitus with diabetic chronic kidney disease: Secondary | ICD-10-CM | POA: Diagnosis not present

## 2015-09-04 DIAGNOSIS — I5042 Chronic combined systolic (congestive) and diastolic (congestive) heart failure: Secondary | ICD-10-CM | POA: Diagnosis not present

## 2015-09-04 DIAGNOSIS — N184 Chronic kidney disease, stage 4 (severe): Secondary | ICD-10-CM | POA: Diagnosis not present

## 2015-09-04 DIAGNOSIS — I130 Hypertensive heart and chronic kidney disease with heart failure and stage 1 through stage 4 chronic kidney disease, or unspecified chronic kidney disease: Secondary | ICD-10-CM | POA: Diagnosis not present

## 2015-09-04 DIAGNOSIS — I482 Chronic atrial fibrillation: Secondary | ICD-10-CM | POA: Diagnosis not present

## 2015-09-07 ENCOUNTER — Other Ambulatory Visit: Payer: Medicare Other

## 2015-09-07 NOTE — Patient Outreach (Signed)
[facility]) Care Management  09/07/2015  ALFREAD SRINIVAS 1941-03-18 [id number]   Request received from Nat Christen, LCSW to send patient a list of transportation resources for his county. Packet was mailed out 09/07/2015.  Jermon Chalfant L. Orlanda Lemmerman, [place] Care Management Assistant

## 2015-09-08 ENCOUNTER — Encounter: Payer: Self-pay | Admitting: *Deleted

## 2015-09-08 DIAGNOSIS — E1122 Type 2 diabetes mellitus with diabetic chronic kidney disease: Secondary | ICD-10-CM | POA: Diagnosis not present

## 2015-09-08 DIAGNOSIS — M069 Rheumatoid arthritis, unspecified: Secondary | ICD-10-CM | POA: Diagnosis not present

## 2015-09-08 DIAGNOSIS — I5042 Chronic combined systolic (congestive) and diastolic (congestive) heart failure: Secondary | ICD-10-CM | POA: Diagnosis not present

## 2015-09-08 DIAGNOSIS — I130 Hypertensive heart and chronic kidney disease with heart failure and stage 1 through stage 4 chronic kidney disease, or unspecified chronic kidney disease: Secondary | ICD-10-CM | POA: Diagnosis not present

## 2015-09-08 DIAGNOSIS — N184 Chronic kidney disease, stage 4 (severe): Secondary | ICD-10-CM | POA: Diagnosis not present

## 2015-09-08 DIAGNOSIS — I482 Chronic atrial fibrillation: Secondary | ICD-10-CM | POA: Diagnosis not present

## 2015-09-09 ENCOUNTER — Other Ambulatory Visit: Payer: Self-pay | Admitting: *Deleted

## 2015-09-09 DIAGNOSIS — M069 Rheumatoid arthritis, unspecified: Secondary | ICD-10-CM | POA: Diagnosis not present

## 2015-09-09 DIAGNOSIS — I130 Hypertensive heart and chronic kidney disease with heart failure and stage 1 through stage 4 chronic kidney disease, or unspecified chronic kidney disease: Secondary | ICD-10-CM | POA: Diagnosis not present

## 2015-09-09 DIAGNOSIS — N184 Chronic kidney disease, stage 4 (severe): Secondary | ICD-10-CM | POA: Diagnosis not present

## 2015-09-09 DIAGNOSIS — I5042 Chronic combined systolic (congestive) and diastolic (congestive) heart failure: Secondary | ICD-10-CM | POA: Diagnosis not present

## 2015-09-09 DIAGNOSIS — E1122 Type 2 diabetes mellitus with diabetic chronic kidney disease: Secondary | ICD-10-CM | POA: Diagnosis not present

## 2015-09-09 DIAGNOSIS — I482 Chronic atrial fibrillation: Secondary | ICD-10-CM | POA: Diagnosis not present

## 2015-09-09 NOTE — Patient Outreach (Addendum)
[name]) Care Management  09/09/2015  Bruce Cole 1940-09-15 [id number]   Telephone Assessment  RN spoke with pt today and introduced the [facility] program and services. Pt reports [facility] continues to visit ad has fixed monitoring device for his weight and BP. Pt reports his weight last week was 287 lbs, yesterday 280 lbs and today 276 lbs. RN reiterated on the HF zone as pt familiar but receptive to review. Pt is in the GREEN zone however states his doctor is aware that he has "alittle swelling" to his lower legs. Pt states its much better at night but during the day some swelling occurs. RN stress the importance of daily weights and to report any weight over over 3 lbs overnight or 5 lbs within one week ( pt with understanding).  Denies any SOB or related issues. RN offered community home visits for further education concerning his ongoing medical issues however pt hesitate due to the other involved services. RN explained no conflict with other services as far as [facility] however RN available to schedule home visit at pt's convenience to further enhance his education on his medical condition (HF). Pt has  requested RN to follow up on Monday after his appointment with his cardiologist on this Friday. RN scheduled a follow up call on Monday as requested by this pt for ongoing [facility] services.   [name], RN Care Management Coordinator [place] Office [phone]

## 2015-09-09 NOTE — Patient Outreach (Signed)
[facility]) Care Management  09/09/2015  Bruce Cole Feb 28, 1941 [id number]   RN attempted to reach pt today however unsuccessful. RN able to leave a HIPAA approved voice message. Will continue outreach calls accordingly for pending services.  [name], RN Care Management Coordinator [place] Office [phone]

## 2015-09-10 ENCOUNTER — Other Ambulatory Visit: Payer: Self-pay | Admitting: Internal Medicine

## 2015-09-10 DIAGNOSIS — M069 Rheumatoid arthritis, unspecified: Secondary | ICD-10-CM | POA: Diagnosis not present

## 2015-09-10 DIAGNOSIS — E1122 Type 2 diabetes mellitus with diabetic chronic kidney disease: Secondary | ICD-10-CM | POA: Diagnosis not present

## 2015-09-10 DIAGNOSIS — I482 Chronic atrial fibrillation: Secondary | ICD-10-CM | POA: Diagnosis not present

## 2015-09-10 DIAGNOSIS — I5042 Chronic combined systolic (congestive) and diastolic (congestive) heart failure: Secondary | ICD-10-CM | POA: Diagnosis not present

## 2015-09-10 DIAGNOSIS — I130 Hypertensive heart and chronic kidney disease with heart failure and stage 1 through stage 4 chronic kidney disease, or unspecified chronic kidney disease: Secondary | ICD-10-CM | POA: Diagnosis not present

## 2015-09-10 DIAGNOSIS — N184 Chronic kidney disease, stage 4 (severe): Secondary | ICD-10-CM | POA: Diagnosis not present

## 2015-09-11 ENCOUNTER — Other Ambulatory Visit: Payer: Self-pay

## 2015-09-11 ENCOUNTER — Encounter (HOSPITAL_COMMUNITY): Payer: Self-pay | Admitting: Internal Medicine

## 2015-09-11 ENCOUNTER — Ambulatory Visit (HOSPITAL_COMMUNITY)
Admit: 2015-09-11 | Discharge: 2015-09-11 | Disposition: A | Discharge status: Home / Self Care | Payer: Medicare Other | Source: Ambulatory Visit | Attending: Internal Medicine | Admitting: Internal Medicine

## 2015-09-11 ENCOUNTER — Ambulatory Visit (INDEPENDENT_AMBULATORY_CARE_PROVIDER_SITE_OTHER): Payer: Medicare Other | Admitting: Pharmacist

## 2015-09-11 VITALS — BP 126/62 | HR 61 | Wt 271.2 lb

## 2015-09-11 DIAGNOSIS — I482 Chronic atrial fibrillation, unspecified: Secondary | ICD-10-CM

## 2015-09-11 DIAGNOSIS — I130 Hypertensive heart and chronic kidney disease with heart failure and stage 1 through stage 4 chronic kidney disease, or unspecified chronic kidney disease: Secondary | ICD-10-CM | POA: Diagnosis not present

## 2015-09-11 DIAGNOSIS — Z7901 Long term (current) use of anticoagulants: Secondary | ICD-10-CM | POA: Insufficient documentation

## 2015-09-11 DIAGNOSIS — R001 Bradycardia, unspecified: Secondary | ICD-10-CM | POA: Insufficient documentation

## 2015-09-11 DIAGNOSIS — Z79899 Other long term (current) drug therapy: Secondary | ICD-10-CM | POA: Insufficient documentation

## 2015-09-11 DIAGNOSIS — M069 Rheumatoid arthritis, unspecified: Secondary | ICD-10-CM | POA: Insufficient documentation

## 2015-09-11 DIAGNOSIS — I5022 Chronic systolic (congestive) heart failure: Secondary | ICD-10-CM | POA: Diagnosis not present

## 2015-09-11 DIAGNOSIS — M109 Gout, unspecified: Secondary | ICD-10-CM | POA: Diagnosis not present

## 2015-09-11 DIAGNOSIS — Z5181 Encounter for therapeutic drug level monitoring: Secondary | ICD-10-CM | POA: Diagnosis not present

## 2015-09-11 DIAGNOSIS — N184 Chronic kidney disease, stage 4 (severe): Secondary | ICD-10-CM | POA: Insufficient documentation

## 2015-09-11 DIAGNOSIS — G4733 Obstructive sleep apnea (adult) (pediatric): Secondary | ICD-10-CM | POA: Insufficient documentation

## 2015-09-11 DIAGNOSIS — I4891 Unspecified atrial fibrillation: Secondary | ICD-10-CM

## 2015-09-11 DIAGNOSIS — Z8249 Family history of ischemic heart disease and other diseases of the circulatory system: Secondary | ICD-10-CM | POA: Diagnosis not present

## 2015-09-11 DIAGNOSIS — I5032 Chronic diastolic (congestive) heart failure: Secondary | ICD-10-CM | POA: Diagnosis not present

## 2015-09-11 DIAGNOSIS — M25559 Pain in unspecified hip: Secondary | ICD-10-CM | POA: Diagnosis not present

## 2015-09-11 LAB — BASIC METABOLIC PANEL
Anion gap: 13 (ref 5–15)
BUN: 66 mg/dL — ABNORMAL HIGH (ref 6–20)
CALCIUM: 9.5 mg/dL (ref 8.9–10.3)
CHLORIDE: 99 mmol/L — AB (ref 101–111)
CO2: 30 mmol/L (ref 22–32)
Creatinine, Ser: 2.91 mg/dL — ABNORMAL HIGH (ref 0.61–1.24)
GFR calc Af Amer: 23 mL/min — ABNORMAL LOW (ref >60)
GFR calc non Af Amer: 20 mL/min — ABNORMAL LOW (ref >60)
Glucose, Bld: 96 mg/dL (ref 65–99)
Potassium: 3.2 mmol/L — ABNORMAL LOW (ref 3.5–5.1)
Sodium: 142 mmol/L (ref 135–145)

## 2015-09-11 LAB — POCT INR: INR: 3.7

## 2015-09-11 MED ORDER — METOLAZONE 5 MG PO TABS: 5.0000 mg | ORAL_TABLET | ORAL | Status: DC

## 2015-09-11 MED ORDER — POTASSIUM CHLORIDE CRYS ER 20 MEQ PO TBCR: 20.0000 meq | EXTENDED_RELEASE_TABLET | Freq: Two times a day (BID) | ORAL | Status: DC

## 2015-09-11 NOTE — Patient Outreach (Signed)
[facility]) Care Management  09/11/2015  Bruce Cole March 21, 1941 [name]  Voicemail  Message received from [place], home health nurse following patient states she she delivered patient a working blood pressure monitor.  States patient now has a working blood pressure monitor, scales and oxygen sensor.   PLAN; RNCM will notify patients [facility] community case manager of [name]'s message. No additional follow up needed from this [place] RN,BSN,CCM [facility] Telephonic  [phone]

## 2015-09-11 NOTE — Progress Notes (Signed)
Patient ID: Bruce Cole, male   DOB: 11/15/1940, 75 y.o.   MRN: [name]    Advanced Heart Failure Clinic Note   Primary HF: Dr. Haroldine Laws   HPI: Bruce Cole is a 75 y.o.  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 He has been on chronic coumadin therapy.   R/LHC 02/2013 RA = 28 with prominent v-waves  RV = 67/21/28  PA = 70/23 (42)  PCW = 29  Fick cardiac output/index = 7.5/2.8  PVR = 1.8 [place]  SVR = 610  FA sat = 96%  PA sat = 64%, 70%  No RV LV interaction  Near equalization of RV, LV and RA diastolic pressures ** Essentially normal coronary arteries**  Echo 03/2013 EF 45-50% with grade II diastolic dysfunction, moderate to severe RV dilation with mild systolic dysfunction, PA systolic pressure 60 mmHg.   Admitted 1/30-2/04/2016 with volume overload.  Creatinine was noted to have been gradually increasing over the past several months prior to admission, and was as high as 3.92 (day of discharge) With BUN in range of 90-100. He had left AVF placed 08/26/15. Dialysis not initiated during that admission. Discharge weight 281 lbs.  He presents today for follow up. Has been feeling Ok. Sees Dr. [name] next week.  Active, but not playing golf yet. No longer SOB walking around, mostly limited by hip.  Was SOB when he got out of hospital, is down about 10 lbs since discharge. No lightheadedness or dizziness. No CP. No Orthopnea.  Occasional bendopnea. Can't get rid edema ankles and lower legs. Edema is gone in the am and rebuilds throughout the day.  Wearing compression stockings but not very tight.   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  Past Medical History  Diagnosis Date  . Nephrolithiasis   . Arthritis     rheumatoid  . Colon polyp 2000  . Atrial fibrillation ([place])   . Unspecified essential hypertension   . Gout   . CHF (congestive heart failure) ([place])     Current Outpatient Prescriptions  Medication Sig Dispense Refill  . albuterol (PROVENTIL) (2.5 MG/3ML) 0.083% nebulizer solution Take 2.5 mg by nebulization every 4 (four) hours.    [name] allopurinol (ZYLOPRIM) 100 MG tablet TAKE 2 TABLETS BY MOUTH EVERY DAY 180 tablet 0  . carvedilol (COREG) 3.125 MG tablet Take 1 tablet (3.125 mg total) by mouth 2 (two) times daily. 60 tablet 0  . furosemide (LASIX) 80 MG tablet Take 1 tablet (80 mg total) by mouth 3 (three) times daily. 90 tablet 0  . hydrALAZINE (APRESOLINE) 50 MG tablet Take 50 mg by mouth 2 (two) times daily.    . hydroxychloroquine (PLAQUENIL) 200 MG  tablet Take 200 mg by mouth 2 (two) times daily.    . isosorbide mononitrate (IMDUR) 30 MG 24 hr tablet TAKE 1 TABLET BY MOUTH EVERY DAY 90 tablet 0  . Multiple Vitamin (MULTIVITAMIN WITH MINERALS) TABS tablet Take 1 tablet by mouth daily.    . Oxymetazoline HCl (MUCINEX NASAL SPRAY MOISTURE NA) Place 1 spray into both nostrils 4 (four) times daily as needed (congestion). Reported on 09/03/2015    . potassium chloride SA (K-DUR,KLOR-CON) 20 MEQ tablet Take 20 mEq by mouth 2 (two) times daily.    [name] terazosin (HYTRIN) 1 MG capsule TAKE 1 CAPSULE BY MOUTH EVERY NIGHT AT BEDTIME 30 capsule 5  . warfarin (COUMADIN) 7.5 MG tablet TAKE 1 TABLET BY MOUTH DAILY AS DIRECTED BY COUMADIN CLINIC 120 tablet 0  . colchicine 0.6 MG tablet TAKE 2 TABLETS BY MOUTH AT THE START OF GOUT FLARE AND TAKE 1 TABLET 3 HOURS LATER; MAX 3 TABS PER GOUT ATTACK (Patient not taking: Reported  on 09/11/2015) 30 tablet 0  . fluticasone (FLONASE) 50 MCG/ACT nasal spray Place 2 sprays into both nostrils daily. (Patient not taking: Reported on 09/11/2015) 16 g 6   No current facility-administered medications for this encounter.   PHYSICAL EXAM: Filed Vitals:   09/11/15 1412  BP: 126/62  Pulse: 61  Weight: 271 lb 4 oz (123.038 kg)  SpO2: 98%   General:  Well appearing. No resp difficulty. In [place].  HEENT: normal Neck: supple. JVP 9-10 cm + CV waves. Carotids 2+ bilaterally; no bruits. No thyromegaly or nodule noted.  Cor: PMI normal. Bradycardic, Irregular, No rubs, gallops.  2/6 early SEM RUSB Lungs: CTAB, normal effort Abdomen: soft, NT, + distention, no HSM. No bruits or masses. +BS  Extremities: no cyanosis, clubbing, rash,  no edema Neuro: alert & orientedx3, cranial nerves grossly intact. Moves all 4 extremities w/o difficulty. Affect pleasant.  ASSESSMENT & PLAN: 1) Chronic systolic HF with prominent RV failure: Echo 07/2015 EF 40-45%, diffuse hypokinesis, diastolic flattening, Mod LAE, Severe RAE, Severe TR, PA peak pressure 44 mm Hg.  RHC/LHC in 8/14 with no CAD, suggestive of restrictive physiology, and pulmonary venous hypertension (low PVR).  - NYHA II-III symptoms. Difficult to assess fully with Hip pain, limits functional status. He remains volume overloaded in the setting of his End Stage CKD.  - Recommended stronger compression stockings. - Continue lasix 80 mg TID. Will add back 5 mg metolazone twice a week (up to him). Be sure to take extra 20 meq potassium  - Continue Coreg 3.125 mg BID.   - Continue hydralazine 50 mg BID and Imdur 30 mg daily.  He understands he should not use Viagra while on Imdur . 2) OSA: Unable to tolerate CPAP previously. F/u sleep study was improved.  3) Atrial fibrillation: Chronic, rate controlled. Continue coumadin.  - Have previously discussed NOAC and he is not interested - Bradycardia is chronic and asx.  4) CKD IV-V - Sees Dr  [name] next week - Had L AVF placed recent admission. Likely approaching dialysis within next 3-6 months.    Shirley Friar MD 09/11/2015   Patient seen and examined with Oda Kilts, PA-C. We discussed all aspects of the encounter. I agree with the assessment and plan as stated above.   He remains volume overloaded. Will add metolazone 2x/week. Check labs today. He will need to follow closely with Nephrology.   Bensimhon, Daniel,MD 11:00 PM

## 2015-09-11 NOTE — Patient Instructions (Signed)
Start Metolazone 5 mg twice a week  When you take Metolazone take an extra 20 meq (1 tab) of Potassium  Your physician recommends that you schedule a follow-up appointment in: 6 weeks

## 2015-09-14 ENCOUNTER — Other Ambulatory Visit: Payer: Self-pay | Admitting: *Deleted

## 2015-09-14 ENCOUNTER — Other Ambulatory Visit: Payer: Self-pay | Admitting: Pharmacist

## 2015-09-14 NOTE — Patient Outreach (Signed)
[facility] Pharmacist attempted to reach patient this afternoon.  This was first attempt to contact patient.  No answer, will continue to attempt to reach patient.   Karrie Meres, PharmD, [place] [phone]

## 2015-09-14 NOTE — Patient Outreach (Signed)
[name]  09/14/2015  Bruce Cole 10/19/1940 [name]   RN attempted outreach call to pt today however unsuccessful. RN able to leave a HIPAA approved voice message requesting a call back. Will inquire further at that time and intervene accordingly. Note telephonic nurse [name]) has updated this RN on pt's involvement with [facility] agency concerning monitoring tools that have been provided for BP/weight and pulse saturation. Will once again inquire further upon contact with this pt.  [name], RN Care Management Coordinator [place] Office [phone]

## 2015-09-15 ENCOUNTER — Other Ambulatory Visit: Payer: Self-pay | Admitting: *Deleted

## 2015-09-15 ENCOUNTER — Telehealth: Payer: Self-pay | Admitting: *Deleted

## 2015-09-15 DIAGNOSIS — M069 Rheumatoid arthritis, unspecified: Secondary | ICD-10-CM | POA: Diagnosis not present

## 2015-09-15 DIAGNOSIS — I482 Chronic atrial fibrillation: Secondary | ICD-10-CM | POA: Diagnosis not present

## 2015-09-15 DIAGNOSIS — I130 Hypertensive heart and chronic kidney disease with heart failure and stage 1 through stage 4 chronic kidney disease, or unspecified chronic kidney disease: Secondary | ICD-10-CM | POA: Diagnosis not present

## 2015-09-15 DIAGNOSIS — N184 Chronic kidney disease, stage 4 (severe): Secondary | ICD-10-CM | POA: Diagnosis not present

## 2015-09-15 DIAGNOSIS — E1122 Type 2 diabetes mellitus with diabetic chronic kidney disease: Secondary | ICD-10-CM | POA: Diagnosis not present

## 2015-09-15 DIAGNOSIS — I5042 Chronic combined systolic (congestive) and diastolic (congestive) heart failure: Secondary | ICD-10-CM | POA: Diagnosis not present

## 2015-09-15 NOTE — Patient Outreach (Signed)
[facility]) Care Management  09/15/2015  JAYLINN CURBY 1941-02-10 [name]     CSW received a new referral on patient from [name], Telephonic Nurse Case Manager with [place] [facility].  According to Mrs. Green, patient would benefit from social work services and resources to assist with transportation to and from physician appointments.  Mrs. Nyoka Cowden reports that patient was recently discharged from [facility] due to Heart Failure.  Patient is currently receiving home health services through [facility] for nursing and physical therapy. Patient's Home Health Nurse is Johna Roles [phone]).  Mrs. Meredith Pel has been notified of patient's blood pressure monitor not working properly.  Through patient's [facility] benefit, patient has tele-monitoring for weight management and blood pressure checks.   CSW made an initial attempt to try and contact patient today to perform phone assessment, as well as assess and assist with social needs and services, without success.  A HIPAA complaint message was left for patient on voicemail.  CSW is currently awaiting a return call.  Nat Christen, BSW, MSW, LCSW  Licensed Education officer, [facility]  Mailing [place] [place], [place] [address] Physical Address-[address], [place], [place] [address] Toll Free Main # [phone] Fax # [phone] Cell # [phone]  Fax # [phone]  [email or web address]

## 2015-09-15 NOTE — Patient Outreach (Signed)
[facility]) Care Management  09/15/2015  Bruce Cole 05/19/1941 [name]   RN spoke with pt today and reintroduced the [facility] program once again. Pt remains receptive to the inquires and informed RN on his recent weights. Pt reports yesterday and today's weight was 260 lbs and last week it was 280 lbs. Pt denies any swelling or symptoms and indicates he is aware of the HF zones. Pt verified he is in the GREEN zone today with no distress. RN offered additional education concerning his HF and has informed pt that [facility] will not interfere with the current [facility] services he is receiving.  RN also verified pt continues to use the equipment provided for daily monitoring of his weights, BP and saturation provided by [facility] (pt verified usage). RN continued to offer a home visits as pt receptive and the initial home visit was scheduled according to pt's availability next week. Will follow up accordingly for case management services.   [name], RN Care Management Coordinator [place] Office [phone]

## 2015-09-15 NOTE — Telephone Encounter (Signed)
Received Home Health Certification and Plan of Care; forwarded to provider/SLS 02/28

## 2015-09-16 ENCOUNTER — Other Ambulatory Visit: Payer: Self-pay | Admitting: Pharmacist

## 2015-09-16 DIAGNOSIS — I1310 Hypertensive heart and chronic kidney disease without heart failure, with stage 1 through stage 4 chronic kidney disease, or unspecified chronic kidney disease: Secondary | ICD-10-CM | POA: Diagnosis not present

## 2015-09-16 DIAGNOSIS — N184 Chronic kidney disease, stage 4 (severe): Secondary | ICD-10-CM | POA: Diagnosis not present

## 2015-09-16 DIAGNOSIS — I10 Essential (primary) hypertension: Secondary | ICD-10-CM | POA: Diagnosis not present

## 2015-09-16 DIAGNOSIS — N183 Chronic kidney disease, stage 3 (moderate): Secondary | ICD-10-CM | POA: Diagnosis not present

## 2015-09-16 NOTE — Patient Outreach (Signed)
[facility] Pharmacist attempted to reach patient this afternoon. This was second attempt to contact patient. No answer, will continue to attempt to reach patient.   Karrie Meres, PharmD, [place] [phone]

## 2015-09-17 DIAGNOSIS — E1122 Type 2 diabetes mellitus with diabetic chronic kidney disease: Secondary | ICD-10-CM | POA: Diagnosis not present

## 2015-09-17 DIAGNOSIS — I482 Chronic atrial fibrillation: Secondary | ICD-10-CM | POA: Diagnosis not present

## 2015-09-17 DIAGNOSIS — M069 Rheumatoid arthritis, unspecified: Secondary | ICD-10-CM | POA: Diagnosis not present

## 2015-09-17 DIAGNOSIS — N184 Chronic kidney disease, stage 4 (severe): Secondary | ICD-10-CM | POA: Diagnosis not present

## 2015-09-17 DIAGNOSIS — I130 Hypertensive heart and chronic kidney disease with heart failure and stage 1 through stage 4 chronic kidney disease, or unspecified chronic kidney disease: Secondary | ICD-10-CM | POA: Diagnosis not present

## 2015-09-17 DIAGNOSIS — I5042 Chronic combined systolic (congestive) and diastolic (congestive) heart failure: Secondary | ICD-10-CM | POA: Diagnosis not present

## 2015-09-18 DIAGNOSIS — N184 Chronic kidney disease, stage 4 (severe): Secondary | ICD-10-CM | POA: Diagnosis not present

## 2015-09-18 DIAGNOSIS — I482 Chronic atrial fibrillation: Secondary | ICD-10-CM | POA: Diagnosis not present

## 2015-09-18 DIAGNOSIS — M069 Rheumatoid arthritis, unspecified: Secondary | ICD-10-CM | POA: Diagnosis not present

## 2015-09-18 DIAGNOSIS — I130 Hypertensive heart and chronic kidney disease with heart failure and stage 1 through stage 4 chronic kidney disease, or unspecified chronic kidney disease: Secondary | ICD-10-CM | POA: Diagnosis not present

## 2015-09-18 DIAGNOSIS — E1122 Type 2 diabetes mellitus with diabetic chronic kidney disease: Secondary | ICD-10-CM | POA: Diagnosis not present

## 2015-09-18 DIAGNOSIS — I5042 Chronic combined systolic (congestive) and diastolic (congestive) heart failure: Secondary | ICD-10-CM | POA: Diagnosis not present

## 2015-09-21 ENCOUNTER — Telehealth (HOSPITAL_COMMUNITY): Payer: Self-pay | Admitting: *Deleted

## 2015-09-21 MED ORDER — POTASSIUM CHLORIDE CRYS ER 20 MEQ PO TBCR: EXTENDED_RELEASE_TABLET | ORAL | Status: DC

## 2015-09-21 NOTE — Telephone Encounter (Signed)
Notes Recorded by Scarlette Calico, RN on 09/21/2015 at 4:55 PM Pt aware and states Dr [name] had told him to increase it last week

## 2015-09-21 NOTE — Telephone Encounter (Signed)
-----   Message from Scarlette Calico, RN sent at 09/11/2015  4:02 PM EST ----- Per Estrella Myrtle, PA have pt take an extra 40 meq of KCL today and increase dose to 40 meq in AM and 20 meq in PM, have attempted to reach pt and Left message to call back

## 2015-09-22 ENCOUNTER — Ambulatory Visit (HOSPITAL_COMMUNITY)
Admission: RE | Admit: 2015-09-22 | Discharge: 2015-09-22 | Disposition: A | Discharge status: Home / Self Care | Payer: Medicare Other | Source: Ambulatory Visit | Attending: Vascular Surgery | Admitting: Vascular Surgery

## 2015-09-22 DIAGNOSIS — N186 End stage renal disease: Secondary | ICD-10-CM

## 2015-09-22 DIAGNOSIS — I132 Hypertensive heart and chronic kidney disease with heart failure and with stage 5 chronic kidney disease, or end stage renal disease: Secondary | ICD-10-CM | POA: Diagnosis not present

## 2015-09-22 DIAGNOSIS — Z4931 Encounter for adequacy testing for hemodialysis: Secondary | ICD-10-CM | POA: Insufficient documentation

## 2015-09-22 DIAGNOSIS — I509 Heart failure, unspecified: Secondary | ICD-10-CM | POA: Diagnosis not present

## 2015-09-24 ENCOUNTER — Ambulatory Visit: Payer: Self-pay | Admitting: *Deleted

## 2015-09-24 DIAGNOSIS — M069 Rheumatoid arthritis, unspecified: Secondary | ICD-10-CM | POA: Diagnosis not present

## 2015-09-24 DIAGNOSIS — E1122 Type 2 diabetes mellitus with diabetic chronic kidney disease: Secondary | ICD-10-CM | POA: Diagnosis not present

## 2015-09-24 DIAGNOSIS — I130 Hypertensive heart and chronic kidney disease with heart failure and stage 1 through stage 4 chronic kidney disease, or unspecified chronic kidney disease: Secondary | ICD-10-CM | POA: Diagnosis not present

## 2015-09-24 DIAGNOSIS — N184 Chronic kidney disease, stage 4 (severe): Secondary | ICD-10-CM | POA: Diagnosis not present

## 2015-09-24 DIAGNOSIS — I482 Chronic atrial fibrillation: Secondary | ICD-10-CM | POA: Diagnosis not present

## 2015-09-24 DIAGNOSIS — I5042 Chronic combined systolic (congestive) and diastolic (congestive) heart failure: Secondary | ICD-10-CM | POA: Diagnosis not present

## 2015-09-25 ENCOUNTER — Telehealth (HOSPITAL_COMMUNITY): Payer: Self-pay | Admitting: Cardiology

## 2015-09-25 ENCOUNTER — Encounter: Payer: Self-pay | Admitting: Vascular Surgery

## 2015-09-25 ENCOUNTER — Other Ambulatory Visit: Payer: Self-pay | Admitting: *Deleted

## 2015-09-25 DIAGNOSIS — I482 Chronic atrial fibrillation: Secondary | ICD-10-CM | POA: Diagnosis not present

## 2015-09-25 DIAGNOSIS — N184 Chronic kidney disease, stage 4 (severe): Secondary | ICD-10-CM | POA: Diagnosis not present

## 2015-09-25 DIAGNOSIS — E1122 Type 2 diabetes mellitus with diabetic chronic kidney disease: Secondary | ICD-10-CM | POA: Diagnosis not present

## 2015-09-25 DIAGNOSIS — M069 Rheumatoid arthritis, unspecified: Secondary | ICD-10-CM | POA: Diagnosis not present

## 2015-09-25 DIAGNOSIS — I130 Hypertensive heart and chronic kidney disease with heart failure and stage 1 through stage 4 chronic kidney disease, or unspecified chronic kidney disease: Secondary | ICD-10-CM | POA: Diagnosis not present

## 2015-09-25 DIAGNOSIS — I5042 Chronic combined systolic (congestive) and diastolic (congestive) heart failure: Secondary | ICD-10-CM | POA: Diagnosis not present

## 2015-09-25 NOTE — Patient Outreach (Signed)
[facility]) Care Management   09/25/2015  Bruce Cole 1941/04/15 [id number]  Bruce Cole is an 75 y.o. male  Subjective:  HF: Pt reports he is monitoring his HF more closely but was not feel enough emphasis was put on how importance it was to monitor his HF. Pt states has changed and now monitors his HF daily using the tel-monitoring system provided by [facility] for weights, BP and saturation levels. Pt states he completes this process daily and has remained at the same weight over the last few days 259 lbs. Pt states he believes he is at his dry weight and started feeling lightheaded. Pt receptive to RN contacingt his provider with this information. Indicates Dr. Haroldine Laws follows him for his HF. Denies any falls related to his lightheadedness and inquired if he should change his medication. Pt states several agencies are involved with his care and this can be overwhelming. States he has several visits today as Therapist, sports arrived. RN offered to reschedule however pt declined and requested to complete the home visit.  SAFETY: Strongly encouraged pt on safety in the home and to inform his provider of any new or worsening signs or symptoms to avoid acute events. Note pt has not informed his provider indicating difficult to reach (RN offered to intervene via Dr. Clayborne Dana office).  EDEMA: Pt reports he has a history of swelling and wears compression stockings however states today "is not a good day" and pt did not apply the compressions for today. Pt states his swelling has improved "alot" but will elevate and apply his compression stockings.  NUTRITION: Pt reports he eats healthy but will continue to reduce his sodium intake due to his ongoing swelling to his lower legs. Pt declined visual teaching aid on low and high sodium food items and how to read a label.   Objective:   Review of Systems  Constitutional: Negative.   HENT: Negative.   Eyes: Negative.   Respiratory: Negative.    Cardiovascular: Negative.   Gastrointestinal: Negative.   Genitourinary: Negative.   Musculoskeletal: Negative.   Skin: Negative.   Neurological: Negative.   Endo/Heme/Allergies: Negative.   Psychiatric/Behavioral: Negative.     Physical Exam  Constitutional: He is oriented to person, place, and time. He appears well-developed and well-nourished.  HENT:  Right Ear: External ear normal.  Left Ear: External ear normal.  Eyes: EOM are normal.  Neck: Normal range of motion.  Cardiovascular: Normal heart sounds.   Respiratory: Effort normal and breath sounds normal.  GI: Soft. Bowel sounds are normal.  Musculoskeletal: Normal range of motion. He exhibits edema.  Bilateral edema with left  2+ and right lower leg with trace of swelling noted.  Neurological: He is alert and oriented to person, place, and time.  Skin: Skin is warm and dry.  Psychiatric: He has a normal mood and affect. His behavior is normal. Judgment and thought content normal.    Current Medications:   Current Outpatient Prescriptions  Medication Sig Dispense Refill  . albuterol (PROVENTIL) (2.5 MG/3ML) 0.083% nebulizer solution Take 2.5 mg by nebulization every 4 (four) hours.    [name] allopurinol (ZYLOPRIM) 100 MG tablet TAKE 2 TABLETS BY MOUTH EVERY DAY 180 tablet 0  . carvedilol (COREG) 3.125 MG tablet Take 1 tablet (3.125 mg total) by mouth 2 (two) times daily. 60 tablet 0  . colchicine 0.6 MG tablet TAKE 2 TABLETS BY MOUTH AT THE START OF GOUT FLARE AND TAKE 1 TABLET 3 HOURS LATER; MAX  3 TABS PER GOUT ATTACK (Patient not taking: Reported on 09/11/2015) 30 tablet 0  . fluticasone (FLONASE) 50 MCG/ACT nasal spray Place 2 sprays into both nostrils daily. (Patient not taking: Reported on 09/11/2015) 16 g 6  . furosemide (LASIX) 80 MG tablet Take 1 tablet (80 mg total) by mouth 3 (three) times daily. 90 tablet 0  . hydrALAZINE (APRESOLINE) 50 MG tablet Take 50 mg by mouth 2 (two) times daily.    . hydroxychloroquine  (PLAQUENIL) 200 MG tablet Take 200 mg by mouth 2 (two) times daily.    . isosorbide mononitrate (IMDUR) 30 MG 24 hr tablet TAKE 1 TABLET BY MOUTH EVERY DAY 90 tablet 0  . metolazone (ZAROXOLYN) 5 MG tablet Take 1 tablet (5 mg total) by mouth once a week. 10 tablet 3  . Multiple Vitamin (MULTIVITAMIN WITH MINERALS) TABS tablet Take 1 tablet by mouth daily.    . Oxymetazoline HCl (MUCINEX NASAL SPRAY MOISTURE NA) Place 1 spray into both nostrils 4 (four) times daily as needed (congestion). Reported on 09/03/2015    . potassium chloride SA (K-DUR,KLOR-CON) 20 MEQ tablet Take 2 tabs in AM and 1 tab in PM, Take an extra tab when you take Metolazone    . terazosin (HYTRIN) 1 MG capsule TAKE 1 CAPSULE BY MOUTH EVERY NIGHT AT BEDTIME 30 capsule 5  . warfarin (COUMADIN) 7.5 MG tablet TAKE 1 TABLET BY MOUTH DAILY AS DIRECTED BY COUMADIN CLINIC 120 tablet 0   No current facility-administered medications for this visit.    Functional Status:   In your present state of health, do you have any difficulty performing the following activities: 08/17/2015  Hearing? N  Vision? N  Difficulty concentrating or making decisions? N  Walking or climbing stairs? N  Dressing or bathing? N  Doing errands, shopping? N    Fall/Depression Screening:    PHQ 2/9 Scores 09/15/2014  PHQ - 2 Score 0   Filed Vitals:   09/25/15 1157  BP: 110/60  Pulse: 56  Resp: 20    Assessment:   Introduction via [facility] services for enrollment into the program Case management related to HF Safety issues related to lightheadedness Bilateral edema related swelling to lower extremities Nutrition education related to salt smart diet and limited fluid retention   Plan:  Will continue completed a physical assessment and verified signed consent form for [facility] services related to HF. Will provide education information and discussed the HF zones in detail increasing pt's knowledge based related to recognizing what to do if acute symptoms  should occur. Pt is aware of gaining 3 lbs overnight or 5 lbs within one week to contact his provider. Will provide pt with monitoring tools for daily weights and review information for the "ASK ME THREE QUESTIONS" as pt with understanding of why it is important to monitor his HF daily.  RN will verified after review on the HF zone that pt is in the GREEN zone however note some swelling to his lower legs at minimal.  After completing the assessment for today RN will contacted pt's provided to report pt's complaint of "feeling lightheaded" and he request to possible alter his medication due to his symptoms.  RN will strongly encouraged pt to continue taking his medications as prescribed until his provider recommends a change in his medication ( pt verbalized an understanding).  Will verify pt's understanding of HF and that it may result in swelling to his lower legs. Will also discuss how he should address ongoing swelling  as a prevention measure. Will discuss compression stockings if swelling not improved with leg elevation. Will demonstrate to pt on how to check for swelling to his lower legs and strongly encourage pt to apply his compression stocking upon awakening to prevent swelling throughout the day. Level of bilateral edema documented in the physical assessment as noted above.  Will verify pt's nutritional habits and limitation on fluid intake. Will discussed fluid retention and how this affects his HF. Will reiterate on a salt smart diet and the consumption of healthy food items (pt with understanding). Will discussed the importance of applying his compression stockings to reduce ongoing swelling upon arrive in the AM. Verified pt has enough supplies and will start using his compression stockings to minimize his ongoing swelling. Verified pt understands the purpose of the TEDS and that they are used for daytime wear. Will generate and discuss a plan of care and goals agreeable to pt over the next few  months. Will scheduled the next home visit based upon pt's availability and re-evaluate pt's progress. Primary will also be updated with this initial home visit report.   [name], RN Care Management Coordinator [place] Office [phone]

## 2015-09-25 NOTE — Telephone Encounter (Signed)
Tried to contact patient on home and mobile number did not get an answer. Spoke with Lattie Haw with [facility] to tell patient to take his metolazone as needed and to make sure he follows up with nephrology.

## 2015-09-25 NOTE — Telephone Encounter (Signed)
[facility] called for patient as he had concerns with increased light headiness Reports he is back to his dry weight of 260 lbs medications was recently increased as weight was up recently   Should patient decrease diuretic? Currently Lasix 80 mf TID, metolazone 5 mg PRN twice a week  Please advise

## 2015-09-28 ENCOUNTER — Other Ambulatory Visit: Payer: Self-pay | Admitting: *Deleted

## 2015-09-28 ENCOUNTER — Encounter: Payer: Self-pay | Admitting: *Deleted

## 2015-09-28 ENCOUNTER — Other Ambulatory Visit: Payer: Self-pay | Admitting: Pharmacist

## 2015-09-28 DIAGNOSIS — E1122 Type 2 diabetes mellitus with diabetic chronic kidney disease: Secondary | ICD-10-CM | POA: Diagnosis not present

## 2015-09-28 DIAGNOSIS — N184 Chronic kidney disease, stage 4 (severe): Secondary | ICD-10-CM | POA: Diagnosis not present

## 2015-09-28 DIAGNOSIS — M069 Rheumatoid arthritis, unspecified: Secondary | ICD-10-CM | POA: Diagnosis not present

## 2015-09-28 DIAGNOSIS — I130 Hypertensive heart and chronic kidney disease with heart failure and stage 1 through stage 4 chronic kidney disease, or unspecified chronic kidney disease: Secondary | ICD-10-CM | POA: Diagnosis not present

## 2015-09-28 DIAGNOSIS — I5042 Chronic combined systolic (congestive) and diastolic (congestive) heart failure: Secondary | ICD-10-CM | POA: Diagnosis not present

## 2015-09-28 DIAGNOSIS — I482 Chronic atrial fibrillation: Secondary | ICD-10-CM | POA: Diagnosis not present

## 2015-09-28 NOTE — Patient Outreach (Signed)
[facility]) Care Management  09/28/2015  Bruce Cole 1940/09/27 [name]   Patient was referred to [place] by [name], [facility] Telephonic RN, as patient reported to Lamar Blinks that he was unsure of what some of his medications were for.   As noted in chart, multiple unsuccessful attempts were made to reach patient by [place] Pharmacist, and an attempt was made 09/28/15.  Patient answered phone and verified his name and date of birth.   [facility] Pharmacist introduced self and explained that purpose of call was following a referral from Lamar Blinks [place] regarding questions patient had about his medications.   Patient reported that he had no medication questions at this time and understood the purpose of phone call.    Based on chart review patient is presently being followed by [name], [facility] RN CM Community.  Karrie Meres, PharmD, [place] [phone]

## 2015-09-28 NOTE — Patient Outreach (Signed)
[facility]) Care Management  09/28/2015  Bruce Cole [birth year] [id number]  CSW was able to make initial contact with patient today to perform phone assessment, as well as assess and assist with social work needs and services.  CSW introduced self, explained role and types of services provided through [place] Management ([place] [facility]).  CSW further explained to patient that CSW works with patient's RNCM, also with [place] [facility], [name]. CSW then explained the reason for the call, indicating that Ms. Zigmund Daniel thought that patient would benefit from social work services and resources to assist with transportation to and from physician appointments.  CSW obtained two HIPAA compliant identifiers from patient, which included patient's name and date of birth. CSW was able to confirm that patient received the packet of resource information that CSW mailed to patient's home containing transportation services/resources.  Patient admits that he has had an opportunity to review the contents of the packet, denying having specific questions or needing assistance regarding referrals for services.  Patient denies having any additional social work needs at present.  CSW provided patient with CSW's contact information, encouraging patient to contact CSW directly if social work needs arise in the near future.  Patient voiced understanding and was agreeable to this plan. CSW will perform a case closure on patient, as all goals of treatment have been met from social work standpoint and no additional social work needs have been identified at this time. CSW will notify patient's RNCM with [place] Management, [name] of CSW's plans to close patient's case. CSW will fax a correspondence letter to patient's Primary Care Physician, Dr. Debbrah Alar to ensure that Dr. Inda Castle is aware of CSW's involvement with patient's care. CSW  will submit a case closure request to Lurline Del, Care Management Assistant with [place] [facility], in the form of an In [employer].  CSW will ensure that Mrs. Laurance Flatten is aware of [name], RNCM with [place] [facility], continued involvement with patient's care.  Nat Christen, BSW, MSW, LCSW  Licensed Education officer, environmental Health System  Mailing [place] [address], [place], [place] [address] Physical Address-[address], [place], [place] [address] Toll Free Main # [phone] Fax # [phone] Cell # [phone]  Fax # [phone]  [email or web address]   Patient's preferred language:  [place]   English  ATTENTION:  If you speak [place], language assistance services, free of charge, are available to you.    Nondiscrimination and Accessibility Statement: Discrimination is Against the [employer], a subsidiary of [employer], complies with [employer] civil rights laws and does not discriminate on the basis of race, color, national origin, age, disability, or sex.  [place] does not exclude people or treat them differently because of race, color, national origin, age, disability, or sex.  [place] Providers will:  . Provide free aids and services to people with disabilities to communicate effectively with [place], such as:     ? Qualified sign language interpreters  ? Written information in other formats (large print, audio, accessible electronic formats, other formats)   . Provide free language services to people whose primary language is not [place], such as:    ? Qualified interpreters    ? Information written in other languages   If you need these services, contact your [facility] psychologist.  If you believe that a  [facility] psychologist has failed to provide these  services or discriminated in another way on the basis of race, color, national origin, age, disability, or sex, you can file a Tourist information centre manager with: [place], [phone] or [email or web address].info/.  You can file a grievance in person or by mail, fax, or email. If you need help filing a grievance, you may contact Valrie Hart, Interim Compliance Officer, [facility] Department of Compliance and Integrity, [place]nd Floor, [place], [place]. [place], [phone], [email or web address].    You can also file a civil rights complaint with the U.S. Department of Health and Financial controller, Office for [employer], electronically through the Office for Civil Rights Complaint Portal, available at OnSiteLending.nl.jsf, or by mail or phone at:  [place]. Department of Health and Human Services [address] [place] Room [id number], [facility] Building [place], [place]  [phone], [phone] (TDD) Complaint forms are available at CutFunds.si.

## 2015-09-29 ENCOUNTER — Encounter: Payer: Self-pay | Admitting: Vascular Surgery

## 2015-09-29 ENCOUNTER — Ambulatory Visit (INDEPENDENT_AMBULATORY_CARE_PROVIDER_SITE_OTHER): Payer: Medicare Other | Admitting: Vascular Surgery

## 2015-09-29 ENCOUNTER — Other Ambulatory Visit: Payer: Self-pay | Admitting: Family

## 2015-09-29 VITALS — BP 129/69 | HR 62 | Temp 98.5°F | Resp 18 | Ht 75.0 in | Wt 264.3 lb

## 2015-09-29 DIAGNOSIS — N184 Chronic kidney disease, stage 4 (severe): Secondary | ICD-10-CM

## 2015-09-29 NOTE — Progress Notes (Signed)
Today for follow-up of left radiocephalic AV fistula creation by myself on 08/26/2015. He has done well. He does report usual numbness around the incision onto the thumb. Minimal discomfort. His incision is well-healed. There was a Vicryl suture that was exposed and was trimmed. He has an excellent thrill with a large vein extending up into his forearm.  Duplex of this from 09/22/2015 was reviewed with the patient. This shows good size dilatation at 4 weeks.   Table overall. Continues to not need hemodialysis. This has a very good chance of being acceptable if needed. Will see [place] again on an as-needed basis

## 2015-09-29 NOTE — Telephone Encounter (Signed)
[name]-- is it ok for [place] to resume refills of pt's furosemide and carvedilol?

## 2015-10-01 DIAGNOSIS — I5042 Chronic combined systolic (congestive) and diastolic (congestive) heart failure: Secondary | ICD-10-CM | POA: Diagnosis not present

## 2015-10-01 DIAGNOSIS — E1122 Type 2 diabetes mellitus with diabetic chronic kidney disease: Secondary | ICD-10-CM | POA: Diagnosis not present

## 2015-10-01 DIAGNOSIS — I482 Chronic atrial fibrillation: Secondary | ICD-10-CM | POA: Diagnosis not present

## 2015-10-01 DIAGNOSIS — N184 Chronic kidney disease, stage 4 (severe): Secondary | ICD-10-CM | POA: Diagnosis not present

## 2015-10-01 DIAGNOSIS — I130 Hypertensive heart and chronic kidney disease with heart failure and stage 1 through stage 4 chronic kidney disease, or unspecified chronic kidney disease: Secondary | ICD-10-CM | POA: Diagnosis not present

## 2015-10-01 DIAGNOSIS — M069 Rheumatoid arthritis, unspecified: Secondary | ICD-10-CM | POA: Diagnosis not present

## 2015-10-02 ENCOUNTER — Ambulatory Visit (INDEPENDENT_AMBULATORY_CARE_PROVIDER_SITE_OTHER): Payer: Medicare Other | Admitting: Pharmacist

## 2015-10-02 DIAGNOSIS — Z5181 Encounter for therapeutic drug level monitoring: Secondary | ICD-10-CM

## 2015-10-02 DIAGNOSIS — Z7901 Long term (current) use of anticoagulants: Secondary | ICD-10-CM | POA: Diagnosis not present

## 2015-10-02 DIAGNOSIS — I482 Chronic atrial fibrillation, unspecified: Secondary | ICD-10-CM

## 2015-10-02 DIAGNOSIS — I4891 Unspecified atrial fibrillation: Secondary | ICD-10-CM | POA: Diagnosis not present

## 2015-10-02 LAB — POCT INR: INR: 2.2

## 2015-10-05 DIAGNOSIS — I482 Chronic atrial fibrillation: Secondary | ICD-10-CM | POA: Diagnosis not present

## 2015-10-05 DIAGNOSIS — I5042 Chronic combined systolic (congestive) and diastolic (congestive) heart failure: Secondary | ICD-10-CM | POA: Diagnosis not present

## 2015-10-05 DIAGNOSIS — E1122 Type 2 diabetes mellitus with diabetic chronic kidney disease: Secondary | ICD-10-CM | POA: Diagnosis not present

## 2015-10-05 DIAGNOSIS — N184 Chronic kidney disease, stage 4 (severe): Secondary | ICD-10-CM | POA: Diagnosis not present

## 2015-10-05 DIAGNOSIS — I130 Hypertensive heart and chronic kidney disease with heart failure and stage 1 through stage 4 chronic kidney disease, or unspecified chronic kidney disease: Secondary | ICD-10-CM | POA: Diagnosis not present

## 2015-10-05 DIAGNOSIS — M069 Rheumatoid arthritis, unspecified: Secondary | ICD-10-CM | POA: Diagnosis not present

## 2015-10-08 DIAGNOSIS — I482 Chronic atrial fibrillation: Secondary | ICD-10-CM | POA: Diagnosis not present

## 2015-10-08 DIAGNOSIS — N184 Chronic kidney disease, stage 4 (severe): Secondary | ICD-10-CM | POA: Diagnosis not present

## 2015-10-08 DIAGNOSIS — M069 Rheumatoid arthritis, unspecified: Secondary | ICD-10-CM | POA: Diagnosis not present

## 2015-10-08 DIAGNOSIS — I5042 Chronic combined systolic (congestive) and diastolic (congestive) heart failure: Secondary | ICD-10-CM | POA: Diagnosis not present

## 2015-10-08 DIAGNOSIS — E1122 Type 2 diabetes mellitus with diabetic chronic kidney disease: Secondary | ICD-10-CM | POA: Diagnosis not present

## 2015-10-08 DIAGNOSIS — I130 Hypertensive heart and chronic kidney disease with heart failure and stage 1 through stage 4 chronic kidney disease, or unspecified chronic kidney disease: Secondary | ICD-10-CM | POA: Diagnosis not present

## 2015-10-09 ENCOUNTER — Other Ambulatory Visit: Payer: Self-pay | Admitting: *Deleted

## 2015-10-09 NOTE — Patient Outreach (Signed)
[place] [facility]) Care Management  10/09/2015  Bruce Cole May 09, 1941 [name]  EMMI RED FLAG ON 3/23  RN spoke with pt today and inquired on the EMMI RED concerning his HF. Pt reports his weight over the last 2 days at 262 lbs and last week with 259 lbs. Pt denies any swelling or problems with his breathing and refused a acute home visit from this RN today. Pt states his BP is "okay" with home monitor. RN inquired on recent change in his medications based upon the last home visit where his provider reduce a medications due to his symptoms of dehydration. Pt confirms  he has been taking the recommended dosage with no reported problems at this time. No other inquires or request as RN confirmed next home visit for ongoing case management services.   [name], RN Care Management Coordinator [place] Office [phone]

## 2015-10-09 NOTE — Patient Outreach (Signed)
[place] [facility]) Care Management  10/09/2015  KOLBY GARRON [birth year] [name]   Patient triggered RED on EMMI Heart Failure dashboard, notification sent to [name], RN.  Thanks, Ronnell [place]. [place], [place] Assistant Phone: [phone] Fax: [phone]

## 2015-10-12 ENCOUNTER — Other Ambulatory Visit: Payer: Self-pay | Admitting: *Deleted

## 2015-10-12 NOTE — Patient Outreach (Signed)
[facility]) Care Management  10/12/2015  Bruce Cole [birth year] [id number]   EMMI RED FLAG 3/26  RN completed an outreach call today to this pt concerning symptoms reported on yesterday. RN explained the purpose for today's call and reintroduced RN concerning his HF community nurse. Pt reports his weight was 265 lbs from 266 lbs on yesterday and pt confirmed he has gained 5 lbs in one week but has an action plan from his provider. Pt denies any swelling and reports no issues with his breathing at this time with improved symptoms from yesterday. RN reviewed the HF zones with pt in the GREEN zone other then his current intervention with fluid retention over the last week. Pt verbalized an understanding that RN will follow up with a call if he has a RED FLAG on his EMMI call. No other inquires at this time as pt continues to manager his care with his providers awareness. Pt understands to follow up with his provider if symptoms worsen. No other issues to address at this time.  [name], RN Care Management Coordinator [place] Office [phone]

## 2015-10-14 ENCOUNTER — Other Ambulatory Visit: Payer: Self-pay | Admitting: Family

## 2015-10-14 ENCOUNTER — Other Ambulatory Visit: Payer: Self-pay | Admitting: *Deleted

## 2015-10-14 NOTE — Patient Outreach (Signed)
[facility]) Care Management  10/13/2015  Izea Brensinger [place] Aug 20, 1940 [name]   RED FLAG EMMI HF  RN attempted outreach call to pt however only able to leave a HIPAA approved voice message requesting a call back. Will inquired further at that time on pt's symptoms reported.   [name], RN Care Management Coordinator [place] Office [phone]

## 2015-10-23 ENCOUNTER — Other Ambulatory Visit: Payer: Self-pay | Admitting: Family

## 2015-10-23 ENCOUNTER — Other Ambulatory Visit: Payer: Self-pay

## 2015-10-23 NOTE — Telephone Encounter (Signed)
Not on current medication list.  Advise please.

## 2015-10-24 NOTE — Telephone Encounter (Signed)
My records show that this medication was stopped at discharge from his hospitalization in the end of January.  Has he been taking anyway?  If he has been taking ok to continue and send refill as most recent BP looked good.  If he has not been taking, do not send refill.

## 2015-10-27 ENCOUNTER — Other Ambulatory Visit: Payer: Self-pay | Admitting: *Deleted

## 2015-10-27 ENCOUNTER — Ambulatory Visit (HOSPITAL_COMMUNITY)
Admission: RE | Admit: 2015-10-27 | Discharge: 2015-10-27 | Disposition: A | Discharge status: Home / Self Care | Payer: Medicare Other | Source: Ambulatory Visit | Attending: Internal Medicine | Admitting: Internal Medicine

## 2015-10-27 VITALS — BP 128/68 | HR 66 | Wt 273.0 lb

## 2015-10-27 DIAGNOSIS — Z8249 Family history of ischemic heart disease and other diseases of the circulatory system: Secondary | ICD-10-CM | POA: Insufficient documentation

## 2015-10-27 DIAGNOSIS — M109 Gout, unspecified: Secondary | ICD-10-CM | POA: Insufficient documentation

## 2015-10-27 DIAGNOSIS — M069 Rheumatoid arthritis, unspecified: Secondary | ICD-10-CM | POA: Insufficient documentation

## 2015-10-27 DIAGNOSIS — Z7901 Long term (current) use of anticoagulants: Secondary | ICD-10-CM | POA: Diagnosis not present

## 2015-10-27 DIAGNOSIS — I5023 Acute on chronic systolic (congestive) heart failure: Secondary | ICD-10-CM | POA: Diagnosis not present

## 2015-10-27 DIAGNOSIS — I5032 Chronic diastolic (congestive) heart failure: Secondary | ICD-10-CM

## 2015-10-27 DIAGNOSIS — N184 Chronic kidney disease, stage 4 (severe): Secondary | ICD-10-CM

## 2015-10-27 DIAGNOSIS — I482 Chronic atrial fibrillation: Secondary | ICD-10-CM | POA: Insufficient documentation

## 2015-10-27 DIAGNOSIS — R001 Bradycardia, unspecified: Secondary | ICD-10-CM | POA: Diagnosis not present

## 2015-10-27 DIAGNOSIS — Z79899 Other long term (current) drug therapy: Secondary | ICD-10-CM | POA: Diagnosis not present

## 2015-10-27 DIAGNOSIS — G4733 Obstructive sleep apnea (adult) (pediatric): Secondary | ICD-10-CM | POA: Diagnosis not present

## 2015-10-27 DIAGNOSIS — I130 Hypertensive heart and chronic kidney disease with heart failure and stage 1 through stage 4 chronic kidney disease, or unspecified chronic kidney disease: Secondary | ICD-10-CM | POA: Insufficient documentation

## 2015-10-27 LAB — BASIC METABOLIC PANEL
Anion gap: 13 (ref 5–15)
BUN: 90 mg/dL — AB (ref 6–20)
CALCIUM: 9.6 mg/dL (ref 8.9–10.3)
CHLORIDE: 100 mmol/L — AB (ref 101–111)
CO2: 27 mmol/L (ref 22–32)
CREATININE: 3.3 mg/dL — AB (ref 0.61–1.24)
GFR calc Af Amer: 20 mL/min — ABNORMAL LOW (ref >60)
GFR calc non Af Amer: 17 mL/min — ABNORMAL LOW (ref >60)
Glucose, Bld: 108 mg/dL — ABNORMAL HIGH (ref 65–99)
Potassium: 4 mmol/L (ref 3.5–5.1)
SODIUM: 140 mmol/L (ref 135–145)

## 2015-10-27 MED ORDER — METOLAZONE 5 MG PO TABS: 5.0000 mg | ORAL_TABLET | ORAL | Status: DC

## 2015-10-27 NOTE — Patient Instructions (Signed)
Take Metolazone 5 mg today and tomorrow, THEN 1-2 TIMES A WEEK AS NEEDED FOR WT GREATER THAN 265 LB  When you take Metolazone take an extra 20 meq of Potassium  Labs today  Your physician recommends that you schedule a follow-up appointment in: 5 weeks

## 2015-10-27 NOTE — Progress Notes (Addendum)
Advanced Heart Failure Medication Review by a Pharmacist  Does the patient  feel that his/her medications are working for him/her?  no  Has the patient been experiencing any side effects to the medications prescribed?  no  Does the patient measure his/her own blood pressure or blood glucose at home?  yes   Does the patient have any problems obtaining medications due to transportation or finances?   no  Understanding of regimen: fair Understanding of indications: fair Potential of compliance: fair Patient understands to avoid NSAIDs. Patient understands to avoid decongestants.  Issues to address at subsequent visits: Lasix dosing   Pharmacist comments: Bruce Cole is a 75 yo man here for HF clinic f/u with increasing volume overload. Gained 5 lbs in the past week per patient and feels that current Lasix 80 mg TID dose not enough to keep fluid off. He had been on metolazone previously but ran out a month ago and was unable to fill Rx at pharmacy. Pt endorses orthopnea and PND, unable to lie flat and wakes up gasping for air regularly. He also wakes up several times at night to use the restroom d/t taking evening furosemide dose before going to bed. We discussed altering his regimen to prevent furosemide from interfering with sleep. Bruce Cole will try taking furosemide at 6 AM, noon, and 6 PM to prevent waking up, per his preference. He was reminded that the last dose can be taken earlier in the afternoon if he continues to wake up at night from the furosemide.   Bruce Cole regularly checks his BP and adjusts his carvedilol dose according to his DBP. I usually takes carvedilol 3.125 mg qday and holds all carvedilol if DBP in 40s-50s. He is not symptomatic when DBP in 40s-50s.   Of note on HCQ and has biyearly vision checks, last CBC stable. Also chronically anticoagulated with warfarin for AFib and currently therapeutic, managed by Coumadin clinic.  Time with patient: 10 min Preparation and  documentation time: 10 min Total time: 20 min

## 2015-10-27 NOTE — Progress Notes (Signed)
Patient ID: Bruce Cole, male   DOB: Nov 07, 1940, 75 y.o.   MRN: [id number]    Advanced Heart Failure Clinic Note   Primary HF: Dr. Haroldine Laws   HPI: Bruce Cole is a 75 y.o.  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 He has been on chronic coumadin therapy.   R/LHC 02/2013 RA = 28 with prominent v-waves  RV = 67/21/28  PA = 70/23 (42)  PCW = 29  Fick cardiac output/index = 7.5/2.8  PVR = 1.8 [place]  SVR = 610  FA sat = 96%  PA sat = 64%, 70%  No RV LV interaction  Near equalization of RV, LV and RA diastolic pressures ** Essentially normal coronary arteries**  Echo 03/2013 EF 45-50% with grade II diastolic dysfunction, moderate to severe RV dilation with mild systolic dysfunction, PA systolic pressure 60 mmHg.   Admitted 1/30-2/04/2016 with volume overload.  Creatinine was noted to have been gradually increasing over the past several months prior to admission, and was as high as 3.92 (day of discharge) With BUN in range of 90-100. He had left AVF placed 08/26/15. Dialysis not initiated during that admission. Discharge weight 281 lbs.  He presents today for follow up. Weight has been up at least 5 pounds over past few days and about 9 pounds since last visit. When his weight was 259 he was dizzy and metolazone cut back tp prn. Today he is 273 pounds. More SOB. Now with orthopnea/PND.   Has been feeling Ok. Sees Dr. [name] next week.  Active, but not playing golf yet. No longer SOB walking around, mostly limited by hip.  Was SOB when he got out of hospital, is down about 10 lbs since discharge. No lightheadedness or dizziness. No CP. No Orthopnea.  Occasional bendopnea. Can't get rid edema ankles and lower legs. Edema is gone in the am and rebuilds throughout the day.  Wearing compression stockings but not very tight.   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  Past Medical History  Diagnosis Date  . Nephrolithiasis   . Arthritis     rheumatoid  . Colon polyp 2000  . Atrial fibrillation ([place])   . Unspecified essential hypertension   . Gout   . CHF (congestive heart failure) ([place])     Current Outpatient Prescriptions  Medication Sig Dispense Refill  . albuterol (PROVENTIL) (2.5 MG/3ML) 0.083% nebulizer solution Take 2.5 mg by nebulization every 4 (four) hours. Reported on 09/25/2015    . allopurinol (ZYLOPRIM) 100 MG tablet TAKE 2 TABLETS BY MOUTH EVERY DAY  180 tablet 0  . carvedilol (COREG) 3.125 MG tablet Take 3.125 mg by mouth daily.    . furosemide (LASIX) 80 MG tablet TAKE 1 TABLET(80 MG) BY MOUTH THREE TIMES DAILY 90 tablet 5  . hydrALAZINE (APRESOLINE) 50 MG tablet Take 50 mg by mouth 2 (two) times daily.    . hydroxychloroquine (PLAQUENIL) 200 MG tablet Take 200 mg by mouth 2 (two) times daily.    . isosorbide mononitrate (IMDUR) 30 MG 24 hr tablet TAKE 1 TABLET BY MOUTH EVERY DAY 90 tablet 0  . Multiple Vitamin (MULTIVITAMIN WITH MINERALS) TABS tablet Take 1 tablet by mouth daily.    . potassium chloride SA (K-DUR,KLOR-CON) 20 MEQ tablet Take 20 mEq by mouth 3 (three) times daily.    [name] terazosin (HYTRIN) 1 MG capsule TAKE 1 CAPSULE BY MOUTH EVERY NIGHT AT BEDTIME 90 capsule 1  . warfarin (COUMADIN) 7.5 MG tablet TAKE 1 TABLET BY MOUTH DAILY AS DIRECTED BY COUMADIN CLINIC 120 tablet 0  . colchicine 0.6 MG tablet TAKE 2 TABLETS BY MOUTH AT THE START OF GOUT FLARE AND  TAKE 1 TABLET 3 HOURS LATER; MAX 3 TABS PER GOUT ATTACK 30 tablet 0  . fluticasone (FLONASE) 50 MCG/ACT nasal spray Place 2 sprays into both nostrils daily. 16 g 6   No current facility-administered medications for this encounter.   PHYSICAL EXAM: Filed Vitals:   10/27/15 1048  BP: 128/68  Pulse: 66  Weight: 273 lb (123.832 kg)  SpO2: 93%   General:  Well appearing. No resp difficulty. In [place].  HEENT: normal Neck: supple. JVP jaw + CV waves. Carotids 2+ bilaterally; no bruits. No thyromegaly or nodule noted.  Cor: PMI normal. Bradycardic, Irregular, No rubs, gallops.  2/6 early SEM RUSB Lungs: CTAB, normal effort dull at bases Abdomen: soft, NT, + distention, no HSM. No bruits or masses. +BS  Extremities: no cyanosis, clubbing, rash,  2-3 edema. L wrist AV with thrill.  Neuro: alert & orientedx3, cranial nerves grossly intact. Moves all 4 extremities w/o difficulty. Affect pleasant.  ASSESSMENT & PLAN: 1) Acute on Chronic systolic HF with prominent RV failure: Echo 07/2015 EF 40-45%, diffuse hypokinesis, diastolic flattening, Mod LAE, Severe RAE, Severe TR, PA peak pressure 44 mm Hg.  RHC/LHC in 8/14 with no CAD, suggestive of restrictive physiology, and pulmonary venous hypertension (low PVR).  - NYHA III-IIB symptoms. He remains volume overloaded in the setting of his advanced CKD.  - Continue lasix 80 mg TID. Will add back 5 mg metolazone take 5mg  today and tomorrow then 1-2x per week as needed to keep weight around 265. Be sure to take extra 20 meq potassium with each dose - Continue Coreg 3.125 mg BID.   - Continue hydralazine 50 mg BID and Imdur 30 mg daily.  He understands he should not use Viagra while on Imdur . 2) OSA: Unable to tolerate CPAP previously. F/u sleep study was improved.  3) Atrial fibrillation: Chronic, rate controlled. Continue coumadin.  - Have previously discussed NOAC and he is not interested - Bradycardia is chronic and asx.  4) CKD IV-V - Follows with Dr.  [name]. Will get BMET today - Had L AVF placed recent admission. Likely approaching dialysis within next 3-6 months.    Glori Bickers MD 10/27/2015

## 2015-10-28 ENCOUNTER — Encounter: Payer: Self-pay | Admitting: *Deleted

## 2015-10-28 NOTE — Patient Outreach (Signed)
[facility]) Care Management   10/28/2015  Bruce Cole Mar 08, 1941 [id number]  Bruce Cole is an 75 y.o. male  Subjective:  HF: Pt reports incident of swelling over the last few days and a visit to Dr. Danelle Earthly office for treatment. Pt states he last week's weight was 260 lbs, yesterday 265 lbs and today was 270 lbs with the lower legs swelling. Pt aware of the treatment regimen of extra fluid medication over the next 3 days. If no improvement pt will follow up with Dr. Danelle Earthly office for further instructions. MEDICAL APPOINTMENTS:Pt states he has been attending all medical appointments no delay or no show visits. Reports a visit with the HF clinic today due to swelling. MEDICATION: Pt reports he has been taking all medications as prescribed with no reported issues at this time. SWELLING: Pt on his swelling to his lower legs today and a visit to Dr. Danelle Earthly with a treatment regimen on his diuretic. Pt will follow up in 3 days with his provider if no improvement with the change in his medication. NUTRITIONAL: Pt reports he is eating better and has reduce sodium in his dietary but states he is not "hungry". Reports his weights with no weight loss due to the add swelling today but over all no major drop in his weight.   Objective:   Review of Systems  Constitutional: Negative.   HENT: Negative.   Eyes: Negative.   Respiratory: Negative.   Cardiovascular: Positive for leg swelling.       Bilateral swelling related HF symptoms  Gastrointestinal: Negative.   Genitourinary: Negative.   Musculoskeletal: Negative.   Skin: Negative.   Neurological: Negative.   Endo/Heme/Allergies: Negative.   Psychiatric/Behavioral: Negative.     Physical Exam  Constitutional: He is oriented to person, place, and time. He appears well-developed and well-nourished.  HENT:  Right Ear: External ear normal.  Left Ear: External ear normal.  Eyes: EOM are normal.  Neck: Normal  range of motion.  Cardiovascular: Normal heart sounds.   Respiratory: Effort normal and breath sounds normal.  GI: Soft. Bowel sounds are normal.  Musculoskeletal: Normal range of motion.  Neurological: He is alert and oriented to person, place, and time.  Skin: Skin is warm and dry.  Psychiatric: He has a normal mood and affect. His behavior is normal. Judgment and thought content normal.    Encounter Medications:   Outpatient Encounter Prescriptions as of 10/27/2015  Medication Sig  . albuterol (PROVENTIL) (2.5 MG/3ML) 0.083% nebulizer solution Take 2.5 mg by nebulization every 4 (four) hours. Reported on 09/25/2015  . allopurinol (ZYLOPRIM) 100 MG tablet TAKE 2 TABLETS BY MOUTH EVERY DAY  . carvedilol (COREG) 3.125 MG tablet Take 3.125 mg by mouth daily.  . colchicine 0.6 MG tablet TAKE 2 TABLETS BY MOUTH AT THE START OF GOUT FLARE AND TAKE 1 TABLET 3 HOURS LATER; MAX 3 TABS PER GOUT ATTACK  . fluticasone (FLONASE) 50 MCG/ACT nasal spray Place 2 sprays into both nostrils daily.  . furosemide (LASIX) 80 MG tablet TAKE 1 TABLET(80 MG) BY MOUTH THREE TIMES DAILY  . hydrALAZINE (APRESOLINE) 50 MG tablet Take 50 mg by mouth 2 (two) times daily.  . hydroxychloroquine (PLAQUENIL) 200 MG tablet Take 200 mg by mouth 2 (two) times daily.  . isosorbide mononitrate (IMDUR) 30 MG 24 hr tablet TAKE 1 TABLET BY MOUTH EVERY DAY  . metolazone (ZAROXOLYN) 5 MG tablet Take 1 tablet (5 mg total) by mouth 2 (two) times a week.  AS NEEDED FOR WT GREATER THAN 265  . Multiple Vitamin (MULTIVITAMIN WITH MINERALS) TABS tablet Take 1 tablet by mouth daily.  . potassium chloride SA (K-DUR,KLOR-CON) 20 MEQ tablet Take 20 mEq by mouth 3 (three) times daily.  [name] terazosin (HYTRIN) 1 MG capsule TAKE 1 CAPSULE BY MOUTH EVERY NIGHT AT BEDTIME  . warfarin (COUMADIN) 7.5 MG tablet TAKE 1 TABLET BY MOUTH DAILY AS DIRECTED BY COUMADIN CLINIC   No facility-administered encounter medications on file as of 10/27/2015.     Functional Status:   In your present state of health, do you have any difficulty performing the following activities: 09/28/2015 09/25/2015  Hearing? N N  Vision? N N  Difficulty concentrating or making decisions? N N  Walking or climbing stairs? Y Y  Dressing or bathing? N N  Doing errands, shopping? N N  Preparing Food and eating ? N N  Using the Toilet? N N  In the past six months, have you accidently leaked urine? N N  Do you have problems with loss of bowel control? N N  Managing your Medications? N N  Managing your Finances? N N  Housekeeping or managing your Housekeeping? N N    Fall/Depression Screening:    PHQ 2/9 Scores 10/28/2015 10/27/2015 09/28/2015 09/25/2015 09/15/2014  PHQ - 2 Score 0 0 0 0 0  BP 132/62 mmHg  Pulse 60  Resp 20  SpO2 98%  Assessment:   Ongoing case management related HF Follow up on medical appointments Follow up on medication adherence Swelling related to lower legs (HF) Follow up on nutritional needs related to reduce sodium and healthier eating habits. Plan:  Will completed a physical assessment and report any acute findings.Will review HF zones and verified zone today. Will verify no distressful breathing or swelling to his hand or trunk area however confirmed swelling to his lower legs. Pt states other then the swelling that his is currently treating he is in the "GREEN" zone. Will continue to encourage adherence with documenting all weights and review of the HF zones for daily adherence with manager his HF. Will verify pt has attended all scheduled medical appointments and has sufficient transportation for his medical appointments. RN will continue to encouraged adherence.  Will verify pt aware of his add dosage of medication for his swelling and confirm pt has enough medication for ongoing use within running out of his medications (pt confirms). Will strongly encourage pt to follow up with his provide in 3 days as requested with an update. Will  encouraged adherence with his ongoing medications. RN will continues to stress the importance of reducing ongoing swelling to his lower legs with elevation and use of the current compression stocks which were below the knee for his swelling. Verified pt is aware when to contact his provider if no improvement. RN offered to follow up with a call in 3 days concerning any possible needs or resources that may be needed. Pt receptive and appreciate a scheduled follow up call. Due to the ongoing swelling RN will offer another form of compression with thigh high stockings to control the spread of additional fuild retention to his knees. Pt receptive and requested RN to bring a size 2XL on the next home visit ( will place order via [facility] and delivery on next home visit). Pt is aware to remove the compression stockings at bedtime and to only wear with mobile throughout the day. Will review pt's dietary habits and confirm pt has reduce all sodium intake to  reduce ongoing swelling to his lower extremities. Will continue to encourage adherence with a salt smart diet.  Plan of care discussed and goals adjusted accordingly to accommodate pt's adherence. Will scheduled follow up call in 3 days and the next follow up visit concerning pt's progress in managing his HF.   [name], RN Care Management Coordinator [place] Office [phone]

## 2015-10-28 NOTE — Progress Notes (Signed)
This encounter was created in error - please disregard.

## 2015-10-30 ENCOUNTER — Other Ambulatory Visit: Payer: Self-pay | Admitting: *Deleted

## 2015-10-30 NOTE — Patient Outreach (Signed)
[place] [facility]) Care Management  10/30/2015  JESHUAH BRUDERER 1940/07/23 [name]   Telephone Follow up  Followed up with pt today due to swelling to his lower extremities on the recent home visit. Pt instructed by his provider after office visit to take additional fluid medication for three day if no improvement to contact office. RN attempted the outreach call today on pt's progress however pt not available. RN able to leave a HIPAA approvded voice message requesting a call back. RN will inquire further on pt's progress at that time.   [name], RN Care Management Coordinator [place] Office [phone]

## 2015-11-04 ENCOUNTER — Encounter: Payer: Self-pay | Admitting: Family

## 2015-11-04 ENCOUNTER — Ambulatory Visit (INDEPENDENT_AMBULATORY_CARE_PROVIDER_SITE_OTHER): Payer: Medicare Other | Admitting: Family

## 2015-11-04 VITALS — BP 122/59 | HR 55 | Temp 97.8°F | Resp 24 | Ht 75.0 in | Wt 273.8 lb

## 2015-11-04 DIAGNOSIS — N183 Chronic kidney disease, stage 3 (moderate): Secondary | ICD-10-CM | POA: Diagnosis not present

## 2015-11-04 DIAGNOSIS — I10 Essential (primary) hypertension: Secondary | ICD-10-CM | POA: Diagnosis not present

## 2015-11-04 DIAGNOSIS — N184 Chronic kidney disease, stage 4 (severe): Secondary | ICD-10-CM

## 2015-11-04 DIAGNOSIS — I5042 Chronic combined systolic (congestive) and diastolic (congestive) heart failure: Secondary | ICD-10-CM

## 2015-11-04 DIAGNOSIS — E118 Type 2 diabetes mellitus with unspecified complications: Secondary | ICD-10-CM | POA: Diagnosis not present

## 2015-11-04 DIAGNOSIS — E1122 Type 2 diabetes mellitus with diabetic chronic kidney disease: Secondary | ICD-10-CM

## 2015-11-04 LAB — HEMOGLOBIN A1C: Hgb A1c MFr Bld: 6.7 % — ABNORMAL HIGH (ref 4.6–6.5)

## 2015-11-04 NOTE — Patient Instructions (Signed)
Please complete lab work prior to leaving. Call Anytime you have any of the following symptoms: 1) 3 pound weight gain in 24 hours or 5 pounds in 1 week 2) shortness of breath, with or without a dry hacking cough 3) swelling in the hands, feet or stomach 4) if you have to sleep on extra pillows at night in order to breathe.

## 2015-11-04 NOTE — Assessment & Plan Note (Signed)
BP stable on current meds. Continue same.  

## 2015-11-04 NOTE — Assessment & Plan Note (Signed)
Still appears volume overloaded.  Our readings show weight has not changed but he reports since starting zaroxyln 2 x weekly he is down 4 pounds on his home scale.

## 2015-11-04 NOTE — Assessment & Plan Note (Signed)
Clinically stable, obtain A1c.

## 2015-11-04 NOTE — Assessment & Plan Note (Signed)
Cr stable.  No uremic sxs at this time.  Management per nephrology.

## 2015-11-04 NOTE — Progress Notes (Signed)
Subjective:    Patient ID: Bruce Cole, male    DOB: Apr 09, 1941, 75 y.o.   MRN: [id number]  HPI  Bruce Cole is a 75 yr old male who presents today for follow up.  1) DM2- currently on diet only.  Lab Results  Component Value Date   HGBA1C 6.7* 08/04/2015   HGBA1C 6.6* 03/18/2015   HGBA1C 6.4 12/16/2014   Lab Results  Component Value Date   MICROALBUR 20.7* 12/16/2014   [place] 95 03/18/2015   CREATININE 3.30* 10/27/2015   2) HTN- current BP meds include coreg, lasix, hydralazine, zaroxolyn, hytrin.   BP Readings from Last 3 Encounters:  11/04/15 122/59  10/27/15 132/62  10/28/15 132/62   3) CHF- Saw Cardiology (Dr. Sung Amabile in [place] clinic on 10/27/15) he added back metolazone 1-2 times a week. He is maintained on coumadin for CAF.  INR is being managed by coumadin clinic.  Wt Readings from Last 3 Encounters:  11/04/15 273 lb 12.8 oz (124.195 kg)  10/27/15 270 lb (122.471 kg)  10/27/15 273 lb (123.832 kg)   4) CKD stage 3-  He is followed by Dr. [name], nephrology.  He last saw nephrology about 1 month ago.  He did not make changes to his medications.  He reports nausea is improved overall unless "I get the fluid on my stomach."  Lab Results  Component Value Date   CREATININE 3.30* 10/27/2015    Review of Systems See HPI  Past Medical History  Diagnosis Date  . Nephrolithiasis   . Arthritis     rheumatoid  . Colon polyp 2000  . Atrial fibrillation ([place])   . Unspecified essential hypertension   . Gout   . CHF (congestive heart failure) ([place])      Social History   Social History  . Marital Status: Single    Spouse Name: N/A  . Number of Children: 3  . Years of Education: N/A   Occupational History  .     Social History Main Topics  . Smoking status: Never Smoker   . Smokeless tobacco: Never Used  . Alcohol Use: Yes     Comment: occasional  . Drug Use: No  . Sexual Activity: Not on file   Other Topics Concern  . 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 Spine surgery      x 2  . Joint replacement      Total L-Hip replacement, Right Knee 10/20/09  . Cholecystectomy  1994  . Left and right heart catheterization with coronary angiogram N/A 02/22/2013    Procedure: LEFT AND RIGHT HEART CATHETERIZATION WITH CORONARY ANGIOGRAM;  Surgeon: Jolaine Artist, MD;  Location[facility] CATH LAB;  Service: Cardiovascular;  Laterality: N/A;  . Av fistula placement Left 08/26/2015    Procedure: LEFT RADIOCEPHALIC FISTULA CREATION;  Surgeon: Rosetta Posner, MD;  Location: [facility] OR;  Service: Vascular;  Laterality: Left;    Family History  Problem Relation Age of Onset  . Hypertension Mother   . Arthritis Mother     ?RA  . Hypertension Father     Allergies  Allergen Reactions  . Ace Inhibitors Other (See Comments)    Worsening renal insufficiency    Current Outpatient Prescriptions on File Prior to Visit  Medication Sig Dispense Refill  . albuterol (PROVENTIL) (2.5 MG/3ML) 0.083% nebulizer solution Take 2.5 mg by nebulization  every 4 (four) hours. Reported on 09/25/2015    . allopurinol (ZYLOPRIM) 100 MG tablet TAKE 2 TABLETS BY MOUTH EVERY DAY 180 tablet 0  . carvedilol (COREG) 3.125 MG tablet Take 3.125 mg by mouth daily.    . colchicine 0.6 MG tablet TAKE 2 TABLETS BY MOUTH AT THE START OF GOUT FLARE AND TAKE 1 TABLET 3 HOURS LATER; MAX 3 TABS PER GOUT ATTACK 30 tablet 0  . fluticasone (FLONASE) 50 MCG/ACT nasal spray Place 2 sprays into both nostrils daily. 16 g 6  . furosemide (LASIX) 80 MG tablet TAKE 1 TABLET(80 MG) BY MOUTH THREE TIMES DAILY 90 tablet 5  . hydrALAZINE (APRESOLINE) 50 MG tablet Take 50 mg by mouth 2 (two) times daily.    . hydroxychloroquine (PLAQUENIL) 200 MG tablet Take 200 mg by mouth 2 (two) times daily.    . isosorbide mononitrate (IMDUR) 30 MG 24 hr tablet TAKE 1 TABLET BY MOUTH EVERY DAY 90 tablet 0  .  metolazone (ZAROXOLYN) 5 MG tablet Take 1 tablet (5 mg total) by mouth 2 (two) times a week. AS NEEDED FOR WT GREATER THAN 265 20 tablet 3  . Multiple Vitamin (MULTIVITAMIN WITH MINERALS) TABS tablet Take 1 tablet by mouth daily.    . potassium chloride SA (K-DUR,KLOR-CON) 20 MEQ tablet Take 20 mEq by mouth 3 (three) times daily.    [name] terazosin (HYTRIN) 1 MG capsule TAKE 1 CAPSULE BY MOUTH EVERY NIGHT AT BEDTIME 90 capsule 1  . warfarin (COUMADIN) 7.5 MG tablet TAKE 1 TABLET BY MOUTH DAILY AS DIRECTED BY COUMADIN CLINIC 120 tablet 0   No current facility-administered medications on file prior to visit.    BP 122/59 mmHg  Pulse 55  Temp(Src) 97.8 F (36.6 C) (Oral)  Resp 24  Ht 6\' 3"  (1.905 m)  Wt 273 lb 12.8 oz (124.195 kg)  BMI 34.22 kg/m2  SpO2 97%       Objective:   Physical Exam  Constitutional: He is oriented to person, place, and time. He appears well-developed and well-nourished. No distress.  HENT:  Head: Normocephalic and atraumatic.  Cardiovascular: Normal rate.  An irregular rhythm present.  No murmur heard. Pulmonary/Chest: Effort normal and breath sounds normal. No respiratory distress. He has no wheezes. He has no rales.  Abdominal:  Mild abdominal distension  Musculoskeletal:  3+ bilateral LE edema  Neurological: He is alert and oriented to person, place, and time.  Skin: Skin is warm and dry.  Psychiatric: He has a normal mood and affect. His behavior is normal. Thought content normal.          Assessment & Plan:

## 2015-11-04 NOTE — Progress Notes (Signed)
Pre visit review using our clinic review tool, if applicable. No additional management support is needed unless otherwise documented below in the visit note. 

## 2015-11-10 ENCOUNTER — Emergency Department (HOSPITAL_BASED_OUTPATIENT_CLINIC_OR_DEPARTMENT_OTHER): Payer: Medicare Other

## 2015-11-10 ENCOUNTER — Inpatient Hospital Stay (HOSPITAL_BASED_OUTPATIENT_CLINIC_OR_DEPARTMENT_OTHER)
Admission: EM | Admit: 2015-11-10 | Discharge: 2015-11-25 | DRG: 356 | Disposition: A | Discharge status: Skilled Nursing Facility | Payer: Medicare Other | Attending: Internal Medicine | Admitting: Internal Medicine

## 2015-11-10 ENCOUNTER — Encounter (HOSPITAL_BASED_OUTPATIENT_CLINIC_OR_DEPARTMENT_OTHER): Payer: Self-pay | Admitting: *Deleted

## 2015-11-10 DIAGNOSIS — I712 Thoracic aortic aneurysm, without rupture: Secondary | ICD-10-CM | POA: Diagnosis present

## 2015-11-10 DIAGNOSIS — E8770 Fluid overload, unspecified: Secondary | ICD-10-CM | POA: Diagnosis not present

## 2015-11-10 DIAGNOSIS — I5042 Chronic combined systolic (congestive) and diastolic (congestive) heart failure: Secondary | ICD-10-CM

## 2015-11-10 DIAGNOSIS — I4581 Long QT syndrome: Secondary | ICD-10-CM | POA: Diagnosis present

## 2015-11-10 DIAGNOSIS — R278 Other lack of coordination: Secondary | ICD-10-CM | POA: Diagnosis not present

## 2015-11-10 DIAGNOSIS — N183 Chronic kidney disease, stage 3 unspecified: Secondary | ICD-10-CM | POA: Diagnosis present

## 2015-11-10 DIAGNOSIS — I472 Ventricular tachycardia: Secondary | ICD-10-CM | POA: Diagnosis present

## 2015-11-10 DIAGNOSIS — N186 End stage renal disease: Secondary | ICD-10-CM | POA: Diagnosis not present

## 2015-11-10 DIAGNOSIS — R262 Difficulty in walking, not elsewhere classified: Secondary | ICD-10-CM | POA: Diagnosis not present

## 2015-11-10 DIAGNOSIS — I129 Hypertensive chronic kidney disease with stage 1 through stage 4 chronic kidney disease, or unspecified chronic kidney disease: Secondary | ICD-10-CM | POA: Diagnosis not present

## 2015-11-10 DIAGNOSIS — E669 Obesity, unspecified: Secondary | ICD-10-CM | POA: Diagnosis present

## 2015-11-10 DIAGNOSIS — R791 Abnormal coagulation profile: Secondary | ICD-10-CM | POA: Diagnosis present

## 2015-11-10 DIAGNOSIS — M1611 Unilateral primary osteoarthritis, right hip: Secondary | ICD-10-CM | POA: Insufficient documentation

## 2015-11-10 DIAGNOSIS — D689 Coagulation defect, unspecified: Secondary | ICD-10-CM | POA: Diagnosis not present

## 2015-11-10 DIAGNOSIS — Z79899 Other long term (current) drug therapy: Secondary | ICD-10-CM

## 2015-11-10 DIAGNOSIS — Z96651 Presence of right artificial knee joint: Secondary | ICD-10-CM | POA: Diagnosis present

## 2015-11-10 DIAGNOSIS — I5022 Chronic systolic (congestive) heart failure: Secondary | ICD-10-CM | POA: Diagnosis present

## 2015-11-10 DIAGNOSIS — R001 Bradycardia, unspecified: Secondary | ICD-10-CM | POA: Diagnosis not present

## 2015-11-10 DIAGNOSIS — K2990 Gastroduodenitis, unspecified, without bleeding: Secondary | ICD-10-CM | POA: Diagnosis not present

## 2015-11-10 DIAGNOSIS — R0600 Dyspnea, unspecified: Secondary | ICD-10-CM

## 2015-11-10 DIAGNOSIS — Z7901 Long term (current) use of anticoagulants: Secondary | ICD-10-CM

## 2015-11-10 DIAGNOSIS — E119 Type 2 diabetes mellitus without complications: Secondary | ICD-10-CM | POA: Diagnosis not present

## 2015-11-10 DIAGNOSIS — M6281 Muscle weakness (generalized): Secondary | ICD-10-CM | POA: Diagnosis not present

## 2015-11-10 DIAGNOSIS — T82510A Breakdown (mechanical) of surgically created arteriovenous fistula, initial encounter: Secondary | ICD-10-CM | POA: Diagnosis present

## 2015-11-10 DIAGNOSIS — I482 Chronic atrial fibrillation, unspecified: Secondary | ICD-10-CM

## 2015-11-10 DIAGNOSIS — Z6834 Body mass index (BMI) 34.0-34.9, adult: Secondary | ICD-10-CM | POA: Diagnosis not present

## 2015-11-10 DIAGNOSIS — K922 Gastrointestinal hemorrhage, unspecified: Secondary | ICD-10-CM

## 2015-11-10 DIAGNOSIS — I5023 Acute on chronic systolic (congestive) heart failure: Secondary | ICD-10-CM | POA: Diagnosis not present

## 2015-11-10 DIAGNOSIS — D62 Acute posthemorrhagic anemia: Secondary | ICD-10-CM | POA: Diagnosis not present

## 2015-11-10 DIAGNOSIS — I509 Heart failure, unspecified: Secondary | ICD-10-CM

## 2015-11-10 DIAGNOSIS — J90 Pleural effusion, not elsewhere classified: Secondary | ICD-10-CM | POA: Diagnosis not present

## 2015-11-10 DIAGNOSIS — R34 Anuria and oliguria: Secondary | ICD-10-CM | POA: Diagnosis not present

## 2015-11-10 DIAGNOSIS — N179 Acute kidney failure, unspecified: Secondary | ICD-10-CM | POA: Diagnosis present

## 2015-11-10 DIAGNOSIS — T82898A Other specified complication of vascular prosthetic devices, implants and grafts, initial encounter: Secondary | ICD-10-CM | POA: Diagnosis not present

## 2015-11-10 DIAGNOSIS — I130 Hypertensive heart and chronic kidney disease with heart failure and stage 1 through stage 4 chronic kidney disease, or unspecified chronic kidney disease: Secondary | ICD-10-CM | POA: Diagnosis not present

## 2015-11-10 DIAGNOSIS — D696 Thrombocytopenia, unspecified: Secondary | ICD-10-CM | POA: Insufficient documentation

## 2015-11-10 DIAGNOSIS — K921 Melena: Secondary | ICD-10-CM

## 2015-11-10 DIAGNOSIS — Z7951 Long term (current) use of inhaled steroids: Secondary | ICD-10-CM

## 2015-11-10 DIAGNOSIS — K2971 Gastritis, unspecified, with bleeding: Principal | ICD-10-CM | POA: Diagnosis present

## 2015-11-10 DIAGNOSIS — M069 Rheumatoid arthritis, unspecified: Secondary | ICD-10-CM | POA: Diagnosis present

## 2015-11-10 DIAGNOSIS — Z9889 Other specified postprocedural states: Secondary | ICD-10-CM

## 2015-11-10 DIAGNOSIS — R918 Other nonspecific abnormal finding of lung field: Secondary | ICD-10-CM | POA: Diagnosis not present

## 2015-11-10 DIAGNOSIS — E1129 Type 2 diabetes mellitus with other diabetic kidney complication: Secondary | ICD-10-CM | POA: Diagnosis present

## 2015-11-10 DIAGNOSIS — E1122 Type 2 diabetes mellitus with diabetic chronic kidney disease: Secondary | ICD-10-CM | POA: Diagnosis present

## 2015-11-10 DIAGNOSIS — Z992 Dependence on renal dialysis: Secondary | ICD-10-CM

## 2015-11-10 DIAGNOSIS — Z86010 Personal history of colonic polyps: Secondary | ICD-10-CM | POA: Diagnosis not present

## 2015-11-10 DIAGNOSIS — I959 Hypotension, unspecified: Secondary | ICD-10-CM | POA: Diagnosis present

## 2015-11-10 DIAGNOSIS — M1990 Unspecified osteoarthritis, unspecified site: Secondary | ICD-10-CM | POA: Diagnosis not present

## 2015-11-10 DIAGNOSIS — D631 Anemia in chronic kidney disease: Secondary | ICD-10-CM | POA: Diagnosis present

## 2015-11-10 DIAGNOSIS — D649 Anemia, unspecified: Secondary | ICD-10-CM | POA: Diagnosis present

## 2015-11-10 DIAGNOSIS — R0602 Shortness of breath: Secondary | ICD-10-CM | POA: Diagnosis not present

## 2015-11-10 DIAGNOSIS — G4733 Obstructive sleep apnea (adult) (pediatric): Secondary | ICD-10-CM | POA: Diagnosis present

## 2015-11-10 DIAGNOSIS — N189 Chronic kidney disease, unspecified: Secondary | ICD-10-CM | POA: Diagnosis not present

## 2015-11-10 DIAGNOSIS — R079 Chest pain, unspecified: Secondary | ICD-10-CM | POA: Diagnosis not present

## 2015-11-10 DIAGNOSIS — I10 Essential (primary) hypertension: Secondary | ICD-10-CM | POA: Diagnosis present

## 2015-11-10 DIAGNOSIS — N184 Chronic kidney disease, stage 4 (severe): Secondary | ICD-10-CM

## 2015-11-10 DIAGNOSIS — Z452 Encounter for adjustment and management of vascular access device: Secondary | ICD-10-CM | POA: Diagnosis not present

## 2015-11-10 DIAGNOSIS — R0682 Tachypnea, not elsewhere classified: Secondary | ICD-10-CM | POA: Insufficient documentation

## 2015-11-10 DIAGNOSIS — R195 Other fecal abnormalities: Secondary | ICD-10-CM | POA: Insufficient documentation

## 2015-11-10 DIAGNOSIS — Z95828 Presence of other vascular implants and grafts: Secondary | ICD-10-CM | POA: Insufficient documentation

## 2015-11-10 DIAGNOSIS — Z09 Encounter for follow-up examination after completed treatment for conditions other than malignant neoplasm: Secondary | ICD-10-CM | POA: Diagnosis not present

## 2015-11-10 DIAGNOSIS — R5381 Other malaise: Secondary | ICD-10-CM | POA: Diagnosis present

## 2015-11-10 DIAGNOSIS — E118 Type 2 diabetes mellitus with unspecified complications: Secondary | ICD-10-CM | POA: Diagnosis not present

## 2015-11-10 DIAGNOSIS — R0609 Other forms of dyspnea: Secondary | ICD-10-CM | POA: Diagnosis not present

## 2015-11-10 DIAGNOSIS — I132 Hypertensive heart and chronic kidney disease with heart failure and with stage 5 chronic kidney disease, or end stage renal disease: Secondary | ICD-10-CM | POA: Diagnosis present

## 2015-11-10 DIAGNOSIS — Z5189 Encounter for other specified aftercare: Secondary | ICD-10-CM | POA: Diagnosis not present

## 2015-11-10 DIAGNOSIS — R2681 Unsteadiness on feet: Secondary | ICD-10-CM | POA: Diagnosis not present

## 2015-11-10 DIAGNOSIS — K2970 Gastritis, unspecified, without bleeding: Secondary | ICD-10-CM | POA: Insufficient documentation

## 2015-11-10 DIAGNOSIS — J811 Chronic pulmonary edema: Secondary | ICD-10-CM | POA: Diagnosis not present

## 2015-11-10 DIAGNOSIS — I1310 Hypertensive heart and chronic kidney disease without heart failure, with stage 1 through stage 4 chronic kidney disease, or unspecified chronic kidney disease: Secondary | ICD-10-CM | POA: Diagnosis present

## 2015-11-10 DIAGNOSIS — Y712 Prosthetic and other implants, materials and accessory cardiovascular devices associated with adverse incidents: Secondary | ICD-10-CM | POA: Diagnosis present

## 2015-11-10 DIAGNOSIS — R0902 Hypoxemia: Secondary | ICD-10-CM | POA: Diagnosis not present

## 2015-11-10 DIAGNOSIS — Z96642 Presence of left artificial hip joint: Secondary | ICD-10-CM | POA: Diagnosis present

## 2015-11-10 DIAGNOSIS — J948 Other specified pleural conditions: Secondary | ICD-10-CM | POA: Diagnosis not present

## 2015-11-10 DIAGNOSIS — D638 Anemia in other chronic diseases classified elsewhere: Secondary | ICD-10-CM | POA: Diagnosis not present

## 2015-11-10 LAB — HEPATIC FUNCTION PANEL
ALBUMIN: 4 g/dL (ref 3.5–5.0)
ALT: 14 U/L — ABNORMAL LOW (ref 17–63)
AST: 29 U/L (ref 15–41)
Alkaline Phosphatase: 114 U/L (ref 38–126)
Bilirubin, Direct: 0.2 mg/dL (ref 0.1–0.5)
Indirect Bilirubin: 0.5 mg/dL (ref 0.3–0.9)
TOTAL PROTEIN: 7.1 g/dL (ref 6.5–8.1)
Total Bilirubin: 0.7 mg/dL (ref 0.3–1.2)

## 2015-11-10 LAB — PROTIME-INR
INR: 5.58 — AB (ref 0.00–1.49)
PROTHROMBIN TIME: 49 s — AB (ref 11.6–15.2)

## 2015-11-10 LAB — URINALYSIS, ROUTINE W REFLEX MICROSCOPIC
BILIRUBIN URINE: NEGATIVE
Glucose, UA: NEGATIVE mg/dL
Hgb urine dipstick: NEGATIVE
KETONES UR: NEGATIVE mg/dL
Leukocytes, UA: NEGATIVE
NITRITE: NEGATIVE
PROTEIN: NEGATIVE mg/dL
Specific Gravity, Urine: 1.012 (ref 1.005–1.030)
pH: 5.5 (ref 5.0–8.0)

## 2015-11-10 LAB — CBC WITH DIFFERENTIAL/PLATELET
BASOS PCT: 0 %
Basophils Absolute: 0 10*3/uL (ref 0.0–0.1)
EOS PCT: 0 %
Eosinophils Absolute: 0 10*3/uL (ref 0.0–0.7)
HEMATOCRIT: 20.8 % — AB (ref 39.0–52.0)
Hemoglobin: 6.5 g/dL — CL (ref 13.0–17.0)
LYMPHS ABS: 0.7 10*3/uL (ref 0.7–4.0)
Lymphocytes Relative: 12 %
MCH: 27.5 pg (ref 26.0–34.0)
MCHC: 31.3 g/dL (ref 30.0–36.0)
MCV: 88.1 fL (ref 78.0–100.0)
MONO ABS: 0.5 10*3/uL (ref 0.1–1.0)
MONOS PCT: 9 %
NEUTROS ABS: 4.7 10*3/uL (ref 1.7–7.7)
Neutrophils Relative %: 79 %
PLATELETS: 239 10*3/uL (ref 150–400)
RBC: 2.36 MIL/uL — AB (ref 4.22–5.81)
RDW: 17.8 % — AB (ref 11.5–15.5)
WBC: 5.9 10*3/uL (ref 4.0–10.5)

## 2015-11-10 LAB — BASIC METABOLIC PANEL
Anion gap: 18 — ABNORMAL HIGH (ref 5–15)
BUN: 128 mg/dL — AB (ref 6–20)
CALCIUM: 9.4 mg/dL (ref 8.9–10.3)
CO2: 25 mmol/L (ref 22–32)
Chloride: 93 mmol/L — ABNORMAL LOW (ref 101–111)
Creatinine, Ser: 5.93 mg/dL — ABNORMAL HIGH (ref 0.61–1.24)
GFR calc Af Amer: 10 mL/min — ABNORMAL LOW (ref >60)
GFR, EST NON AFRICAN AMERICAN: 8 mL/min — AB (ref >60)
GLUCOSE: 108 mg/dL — AB (ref 65–99)
Potassium: 3.5 mmol/L (ref 3.5–5.1)
Sodium: 136 mmol/L (ref 135–145)

## 2015-11-10 LAB — TROPONIN I: Troponin I: 0.1 ng/mL — ABNORMAL HIGH (ref <0.031)

## 2015-11-10 LAB — MRSA PCR SCREENING: MRSA BY PCR: NEGATIVE

## 2015-11-10 LAB — MAGNESIUM: MAGNESIUM: 2.6 mg/dL — AB (ref 1.7–2.4)

## 2015-11-10 LAB — OCCULT BLOOD X 1 CARD TO LAB, STOOL: Fecal Occult Bld: POSITIVE — AB

## 2015-11-10 LAB — BRAIN NATRIURETIC PEPTIDE: B Natriuretic Peptide: 284.6 pg/mL — ABNORMAL HIGH (ref 0.0–100.0)

## 2015-11-10 MED ORDER — FUROSEMIDE 10 MG/ML IJ SOLN
80.0000 mg | Freq: Two times a day (BID) | INTRAMUSCULAR | Status: DC
  Administered 2015-11-10 – 2015-11-11 (×2): 80 mg via INTRAVENOUS
  Filled 2015-11-10 (×2): qty 8

## 2015-11-10 MED ORDER — HYDRALAZINE HCL 50 MG PO TABS
50.0000 mg | ORAL_TABLET | Freq: Two times a day (BID) | ORAL | Status: DC
  Administered 2015-11-10 – 2015-11-15 (×8): 50 mg via ORAL
  Filled 2015-11-10 (×9): qty 1

## 2015-11-10 MED ORDER — ADULT MULTIVITAMIN W/MINERALS CH
1.0000 | ORAL_TABLET | Freq: Every day | ORAL | Status: DC
  Administered 2015-11-11 – 2015-11-22 (×12): 1 via ORAL
  Filled 2015-11-10 (×12): qty 1

## 2015-11-10 MED ORDER — ALBUTEROL SULFATE (2.5 MG/3ML) 0.083% IN NEBU
2.5000 mg | INHALATION_SOLUTION | RESPIRATORY_TRACT | Status: DC | PRN
  Administered 2015-11-12 (×2): 2.5 mg via RESPIRATORY_TRACT
  Filled 2015-11-10: qty 3

## 2015-11-10 MED ORDER — ALLOPURINOL 100 MG PO TABS
200.0000 mg | ORAL_TABLET | Freq: Every day | ORAL | Status: DC
  Administered 2015-11-11 – 2015-11-25 (×15): 200 mg via ORAL
  Filled 2015-11-10 (×16): qty 2

## 2015-11-10 MED ORDER — FUROSEMIDE 10 MG/ML IJ SOLN
20.0000 mg | Freq: Once | INTRAMUSCULAR | Status: AC | PRN
  Administered 2015-11-11: 20 mg via INTRAVENOUS
  Filled 2015-11-10: qty 2

## 2015-11-10 MED ORDER — FUROSEMIDE 10 MG/ML IJ SOLN: 80.0000 mg | Freq: Two times a day (BID) | INTRAMUSCULAR | Status: DC

## 2015-11-10 MED ORDER — ISOSORBIDE MONONITRATE ER 30 MG PO TB24
30.0000 mg | ORAL_TABLET | Freq: Every day | ORAL | Status: DC
Start: 2015-11-11 — End: 2015-11-22
  Administered 2015-11-11 – 2015-11-22 (×10): 30 mg via ORAL
  Filled 2015-11-10 (×12): qty 1

## 2015-11-10 MED ORDER — FLUTICASONE PROPIONATE 50 MCG/ACT NA SUSP
2.0000 | Freq: Every day | NASAL | Status: DC
  Administered 2015-11-11 – 2015-11-22 (×9): 2 via NASAL
  Filled 2015-11-10: qty 16

## 2015-11-10 MED ORDER — FUROSEMIDE 10 MG/ML IJ SOLN
100.0000 mg | INTRAMUSCULAR | Status: DC
  Filled 2015-11-10: qty 10

## 2015-11-10 MED ORDER — FUROSEMIDE 10 MG/ML IJ SOLN: 20.0000 mg | Freq: Once | INTRAMUSCULAR | Status: DC

## 2015-11-10 MED ORDER — SODIUM CHLORIDE 0.9 % IV SOLN: 250.0000 mL | INTRAVENOUS | Status: DC | PRN

## 2015-11-10 MED ORDER — SODIUM CHLORIDE 0.9 % IV SOLN
80.0000 mg | Freq: Once | INTRAVENOUS | Status: AC
  Administered 2015-11-10: 80 mg via INTRAVENOUS
  Filled 2015-11-10: qty 80

## 2015-11-10 MED ORDER — ACETAMINOPHEN 325 MG PO TABS
650.0000 mg | ORAL_TABLET | ORAL | Status: DC | PRN
Start: 2015-11-10 — End: 2015-11-25

## 2015-11-10 MED ORDER — SODIUM CHLORIDE 0.9% FLUSH
3.0000 mL | Freq: Two times a day (BID) | INTRAVENOUS | Status: DC
  Administered 2015-11-10 – 2015-11-20 (×16): 3 mL via INTRAVENOUS

## 2015-11-10 MED ORDER — SODIUM CHLORIDE 0.9 % IV SOLN
Freq: Once | INTRAVENOUS | Status: AC
  Administered 2015-11-11: via INTRAVENOUS

## 2015-11-10 MED ORDER — ONDANSETRON HCL 4 MG/2ML IJ SOLN
4.0000 mg | Freq: Four times a day (QID) | INTRAMUSCULAR | Status: DC | PRN
  Administered 2015-11-12 – 2015-11-15 (×3): 4 mg via INTRAVENOUS
  Filled 2015-11-10 (×3): qty 2

## 2015-11-10 MED ORDER — FUROSEMIDE 10 MG/ML IJ SOLN: 40.0000 mg | Freq: Two times a day (BID) | INTRAMUSCULAR | Status: DC

## 2015-11-10 MED ORDER — SODIUM CHLORIDE 0.9% FLUSH
3.0000 mL | INTRAVENOUS | Status: DC | PRN
  Administered 2015-11-11: 3 mL via INTRAVENOUS
  Filled 2015-11-10: qty 3

## 2015-11-10 MED ORDER — HYDROXYCHLOROQUINE SULFATE 200 MG PO TABS
200.0000 mg | ORAL_TABLET | Freq: Two times a day (BID) | ORAL | Status: DC
  Administered 2015-11-10 – 2015-11-25 (×28): 200 mg via ORAL
  Filled 2015-11-10 (×29): qty 1

## 2015-11-10 MED ORDER — CARVEDILOL 3.125 MG PO TABS
3.1250 mg | ORAL_TABLET | Freq: Every day | ORAL | Status: DC
  Administered 2015-11-11 – 2015-11-12 (×2): 3.125 mg via ORAL
  Filled 2015-11-10 (×2): qty 1

## 2015-11-10 MED ORDER — ONDANSETRON HCL 4 MG/2ML IJ SOLN
4.0000 mg | Freq: Once | INTRAMUSCULAR | Status: AC
  Administered 2015-11-10: 4 mg via INTRAVENOUS
  Filled 2015-11-10: qty 2

## 2015-11-10 MED ORDER — PANTOPRAZOLE SODIUM 40 MG IV SOLR
40.0000 mg | Freq: Two times a day (BID) | INTRAVENOUS | Status: DC
  Administered 2015-11-11: 40 mg via INTRAVENOUS
  Filled 2015-11-10: qty 40

## 2015-11-10 MED ORDER — TERAZOSIN HCL 1 MG PO CAPS
1.0000 mg | ORAL_CAPSULE | Freq: Every day | ORAL | Status: DC
  Administered 2015-11-10 – 2015-11-15 (×6): 1 mg via ORAL
  Filled 2015-11-10 (×6): qty 1

## 2015-11-10 NOTE — ED Notes (Signed)
[facility] is aware of room 3W09 at [facility] headed this way.

## 2015-11-10 NOTE — Consult Note (Signed)
Bruce Cole is 25yrmale with stage 4 CKD(creat 3.3 on 10/27/15), hx of left Cim[place] AVF 08/26/15, and multiple medical problems including afib on coumadin, CHF, who  [facility], [facility], CXR showed an increased right sided pleural effusion  He previously had a thoracentesis with neg cytology on 08/19/15.  In ED today labs showed acute anemia hgb 6.5, FOBT+, INR supratherapeutic 5.58. Elevated cr 5.93 and BUN of 128 .    Past Medical History  Diagnosis Date  . Nephrolithiasis   . Arthritis     rheumatoid  . Colon polyp 2000  . Atrial fibrillation ([place]   . Unspecified essential hypertension   . Gout   . CHF (congestive heart failure) ([facility]    Past Surgical History  Procedure Laterality Date  . Spine surgery      x 2  . Joint replacement      Total L-Hip replacement, Right Knee 10/20/09  . Cholecystectomy  1994  . Left and right heart catheterization with coronary angiogram N/A 02/22/2013    Procedure: LEFT AND RIGHT HEART CATHETERIZATION WITH CORONARY ANGIOGRAM;  Surgeon: DJolaine Artist MD;  [facility]  Service: Cardiovascular;  Laterality: N/A;  . Av fistula placement Left 08/26/2015    Procedure: LEFT RADIOCEPHALIC FISTULA CREATION;  Surgeon: TRosetta Posner MD;  [facility]  Service: Vascular;  Laterality: Left;   Social History:  reports that he has never smoked. He has never used smokeless tobacco. He reports that he drinks alcohol. He reports that he does not use illicit drugs. Allergies:  Allergies  Allergen Reactions  . Ace Inhibitors Other (See Comments)    Worsening renal insufficiency   Family History  Problem Relation Age of Onset  . Hypertension Mother   . Arthritis Mother     ?RA  . Hypertension Father     Medications:  Scheduled: . sodium chloride   Intravenous Once  . allopurinol  200 mg Oral Daily  . [START ON 11/11/2015] carvedilol  3.125 mg Oral Daily  . [START ON 11/11/2015] fluticasone  2 spray Each Nare Daily  . [START ON  11/11/2015] furosemide  20 mg Intravenous Once  . [START ON 11/11/2015] furosemide  40 mg Intravenous BID  . hydrALAZINE  50 mg Oral BID  . hydroxychloroquine  200 mg Oral BID  . [START ON 11/11/2015] isosorbide mononitrate  30 mg Oral Daily  . [START ON 11/11/2015] multivitamin with minerals  1 tablet Oral Daily  . pantoprazole (PROTONIX) IV  80 mg Intravenous Once  . [START ON 11/11/2015] pantoprazole (PROTONIX) IV  40 mg Intravenous Q12H  . sodium chloride flush  3 mL Intravenous Q12H  . terazosin  1 mg Oral QHS     ROS: per HPI, arthritis in hips and knees  Blood pressure 122/70, pulse 58, temperature 97.8 F (36.6 C), temperature source Oral, resp. rate 17, height '6\' 3"'  (1.905 m), weight 120.203 kg (265 lb), SpO2 100 %.  General appearance: alert and cooperative Head: Normocephalic, without obvious abnormality, atraumatic Eyes: negative Ears: normal TM's and external ear canals both ears Nose: Nares normal. Septum midline. Mucosa normal. No drainage or sinus tenderness. Throat: lips, mucosa, and tongue normal; teeth and gums normal Resp: diminished breath sounds RLL Chest wall: no tenderness Cardio: regular rate and rhythm, S1, S2 normal, no murmur, click, rub or gallop GI: soft, non-tender; bowel sounds normal; no masses,  no organomegaly Extremities: edema 1-2+ bilat, LUE AVF maturing/marginal for use Skin:  Skin color, texture, turgor normal. No rashes or lesions Neurologic: Grossly normal mild asterixis Results for orders placed or performed during the hospital encounter of 11/10/15 (from the past 48 hour(s))  CBC with Differential/Platelet     Status: Abnormal   Collection Time: 11/10/15  3:50 PM  Result Value Ref Range   WBC 5.9 4.0 - 10.5 K/uL   RBC 2.36 (L) 4.22 - 5.81 MIL/uL   Hemoglobin 6.5 (LL) 13.0 - 17.0 g/dL    Comment: REPEATED TO VERIFY CRITICAL RESULT CALLED TO, READ BACK BY AND VERIFIED WITH: JOSS BENTON RN '@[id number]'  11/10/15 OLSONM    HCT 20.8 (L) 39.0 - 52.0 %    MCV 88.1 78.0 - 100.0 fL   MCH 27.5 26.0 - 34.0 pg   MCHC 31.3 30.0 - 36.0 g/dL   RDW 17.8 (H) 11.5 - 15.5 %   Platelets 239 150 - 400 K/uL   Neutrophils Relative % 79 %   Lymphocytes Relative 12 %   Monocytes Relative 9 %   Eosinophils Relative 0 %   Basophils Relative 0 %   Neutro Abs 4.7 1.7 - 7.7 K/uL   Lymphs Abs 0.7 0.7 - 4.0 K/uL   Monocytes Absolute 0.5 0.1 - 1.0 K/uL   Eosinophils Absolute 0.0 0.0 - 0.7 K/uL   Basophils Absolute 0.0 0.0 - 0.1 K/uL   RBC Morphology ELLIPTOCYTES     Comment: STOMATOCYTES SPHEROCYTES   Basic metabolic panel     Status: Abnormal   Collection Time: 11/10/15  3:50 PM  Result Value Ref Range   Sodium 136 135 - 145 mmol/L   Potassium 3.5 3.5 - 5.1 mmol/L   Chloride 93 (L) 101 - 111 mmol/L   CO2 25 22 - 32 mmol/L   Glucose, Bld 108 (H) 65 - 99 mg/dL   BUN 128 (H) 6 - 20 mg/dL    Comment: RESULTS CONFIRMED BY MANUAL DILUTION   Creatinine, Ser 5.93 (H) 0.61 - 1.24 mg/dL   Calcium 9.4 8.9 - 10.3 mg/dL   GFR calc non Af Amer 8 (L) >60 mL/min   GFR calc Af Amer 10 (L) >60 mL/min    Comment: (NOTE) The eGFR has been calculated using the CKD EPI equation. This calculation has not been validated in all clinical situations. eGFR's persistently <60 mL/min signify possible Chronic Kidney Disease.    Anion gap 18 (H) 5 - 15  Troponin I     Status: Abnormal   Collection Time: 11/10/15  3:50 PM  Result Value Ref Range   Troponin I 0.10 (H) <0.031 ng/mL    Comment:        PERSISTENTLY INCREASED TROPONIN VALUES IN THE RANGE OF 0.04-0.49 ng/mL CAN BE SEEN IN:       -UNSTABLE ANGINA       -CONGESTIVE HEART FAILURE       -MYOCARDITIS       -CHEST TRAUMA       -ARRYHTHMIAS       -LATE PRESENTING MYOCARDIAL INFARCTION       -COPD   CLINICAL FOLLOW-UP RECOMMENDED.   Brain natriuretic peptide     Status: Abnormal   Collection Time: 11/10/15  3:50 PM  Result Value Ref Range   B Natriuretic Peptide 284.6 (H) 0.0 - 100.0 pg/mL  Protime-INR      Status: Abnormal   Collection Time: 11/10/15  3:50 PM  Result Value Ref Range   Prothrombin Time 49.0 (H) 11.6 - 15.2 seconds   INR 5.58 (HH) 0.00 -  1.49    Comment: REPEATED TO VERIFY CRITICAL RESULT CALLED TO, READ BACK BY AND VERIFIED WITH: JOSS BENTON RN '@[id number]'  11/10/15 OLSONM   Hepatic function panel     Status: Abnormal   Collection Time: 11/10/15  3:50 PM  Result Value Ref Range   Total Protein 7.1 6.5 - 8.1 g/dL   Albumin 4.0 3.5 - 5.0 g/dL   AST 29 15 - 41 U/L   ALT 14 (L) 17 - 63 U/L   Alkaline Phosphatase 114 38 - 126 U/L   Total Bilirubin 0.7 0.3 - 1.2 mg/dL   Bilirubin, Direct 0.2 0.1 - 0.5 mg/dL   Indirect Bilirubin 0.5 0.3 - 0.9 mg/dL  Occult blood card to lab, stool Provider will collect     Status: Abnormal   Collection Time: 11/10/15  4:55 PM  Result Value Ref Range   Fecal Occult Bld POSITIVE (A) NEGATIVE  Urinalysis, Routine w reflex microscopic (not at [facility])     Status: None   Collection Time: 11/10/15  6:00 PM  Result Value Ref Range   Color, Urine YELLOW YELLOW   APPearance CLEAR CLEAR   Specific Gravity, Urine 1.012 1.005 - 1.030   pH 5.5 5.0 - 8.0   Glucose, UA NEGATIVE NEGATIVE mg/dL   Hgb urine dipstick NEGATIVE NEGATIVE   Bilirubin Urine NEGATIVE NEGATIVE   Ketones, ur NEGATIVE NEGATIVE mg/dL   Protein, ur NEGATIVE NEGATIVE mg/dL   Nitrite NEGATIVE NEGATIVE   Leukocytes, UA NEGATIVE NEGATIVE    Comment: MICROSCOPIC NOT DONE ON URINES WITH NEGATIVE PROTEIN, BLOOD, LEUKOCYTES, NITRITE, OR GLUCOSE <1000 mg/dL.   Dg Chest Port 1 View  11/10/2015  CLINICAL DATA:  Short of breath for 4 day EXAM: PORTABLE CHEST 1 VIEW COMPARISON:  08/19/2015 FINDINGS: The heart is moderately enlarged with a globular appearance. Moderate right pleural effusion and associated pulmonary opacity in the right mid and lower lung zones has developed. There is no pneumothorax. There is no pulmonary edema. There is no pneumothorax or vascular congestion. IMPRESSION: Right pleural  effusion and basilar pulmonary opacity. Cardiomegaly is noted. Electronically Signed   By: Marybelle Killings M.D.   On: 11/10/2015 16:12    Assessment:  1 Stage 4 CKD with AKI, hemodynamically mediated 2 ABLA, symptomatic 3 S/P AVF LUE, marginal readiness for use 4 Right pleural effusion, recurrent 5 Volume overload  Plan: 1 PRBCs as ordered 2 Diurese (increase dose of furosemide) 3 Hope for recovery of renal fct with support 4 Dialysis if no improvement   Sharvi Mooneyhan C 11/10/2015, 9:54 PM

## 2015-11-10 NOTE — ED Notes (Signed)
Sob x 4 days. States he hurts all over. Weakness. Nausea. Fatigue.

## 2015-11-10 NOTE — Progress Notes (Signed)
Bruce Cole Male, 75 y.o., 04-Feb-1941  Mr Bruce Cole is 4yr male with multiple medical problems including afib on coumadin, chf, ckd IV s/p left radiocephalic AV fistula in [id number], presented to [facility] ED due to sob, edema, cxr showed right sided pleural effusion, labs showed acute anemia hgb 6.5, FOBT+, INR supratherapeutic 5.58. Elevated cr 5.93( above baseline), per EDP patient has not urinated in the ED, ua pending.   Per EDP, Patient does not seem in acute distress at rest, he is currently on room air, o2 sats above 90,sbp in 110's,  heart rate in the 50's.   Patient is accepted to stepdown unit, i have requested EDP to call cardiology and nephrology. Patient will need prbc transfusion and GI consult in am.

## 2015-11-10 NOTE — H&P (Signed)
History and Physical    Bruce Cole [name] DOB: 1941-02-15 DOA: 11/10/2015  Referring Provider: Dr. Jeneen Rinks ([place]) PCP: Nance Pear., NP  Outpatient Specialists: Dr. Sung Amabile (cardiology), Dr. [name] (nephrology)   Patient coming from: Home   Chief Complaint: SOB, malaise  HPI: Bruce Cole is a 75 y.o. male with medical history significant for hypertension, rheumatoid arthritis, chronic atrial fibrillation on warfarin, chronic systolic CHF, and chronic kidney disease stage IV who presents to the ED at [facility] for evaluation of dyspnea and malaise of 4 days duration. Patient has been followed closely by outpatient cardiologist and nephrologist and had AV fistula created in the left upper extremity on 08/26/2015, but has not yet required dialysis. He had been in his usual state of health until approximately 4 days ago when he developed insidious onset of dyspnea, malaise, and upset stomach. Patient denies any fevers, chills, or significant cough. He endorses abdominal discomfort, but denies pain per se. He also denies vomiting or diarrhea.  ED Course: Upon arrival to the [facility] ED, patient is found to be afebrile, saturating adequately on room air, and with vitals otherwise stable. EKG features a junctional rhythm with low voltage QRS and prolonged QT interval. Chest x-ray demonstrates a right sided pleural effusion with an associated opacity in the right base. CMP is notable for a BUN of 128 and serum creatinine of 5.93, up from 3.32 weeks prior. CBC is notable for hemoglobin of 6.5, down from an apparent baseline of 9.5. INR is prolonged to 5.58, troponin elevated to 0.10, and BNP elevated 285. Urine was obtained for analysis and grossly negative for infection. Transfer to [facility] for [facility] was requested and accepted.  Patient is evaluated in the stepdown unit at [facility] where he is found to be requiring 2 Lpm supplemental oxygen to maintain  adequate saturations. He is afebrile with vitals otherwise stable. EDP, Dr. Jeneen Rinks, has consulted nephrology and cardiology prior to the transfer. Patient will be admitted for ongoing evaluation and management of symptomatic anemia suspected secondary to acute GI blood loss with supratherapeutic INR, and dyspnea likely secondary to pleural effusion.   Review of Systems:  All other systems reviewed and apart from HPI, are negative.  Past Medical History  Diagnosis Date  . Nephrolithiasis   . Arthritis     rheumatoid  . Colon polyp 2000  . Atrial fibrillation ([place])   . Unspecified essential hypertension   . Gout   . [facility]cong[facility] failure[facility])     Past Surgical History  Procedure Laterality Date  . Spine surgery      x 2  . Joint replacement      Total L-Hip replacement, Right Knee 10/20/09  . Cholecystectomy  1994  . Left and right heart catheterization with coronary angiogram N/A 02/22/2013    Procedure: LEFT AND RIGHT HEART CATHETERIZATION WITH CORONARY ANGIOGRAM;  Surgeon: Jolaine Artist, MD;  Location: [facility] CATH LAB;  Service: Cardiovascular;  Laterality: N/A;  . Av fistula placement Left 08/26/2015    Procedure: LEFT RADIOCEPHALIC FISTULA CREATION;  Surgeon: Rosetta Posner, MD;  Location: [place];  Service: Vascular;  Laterality: Left;     reports that he has never smoked. He has never used smokeless tobacco. He reports that he drinks alcohol. He reports that he does not use illicit drugs.  Allergies  Allergen Reactions  . Ace Inhibitors Other (See Comments)    Worsening renal insufficiency    Family History  Problem  Relation Age of Onset  . Hypertension Mother   . Arthritis Mother     ?RA  . Hypertension Father      Prior to Admission medications   Medication Sig Start Date End Date Taking? Authorizing Provider  albuterol (PROVENTIL) (2.5 MG/3ML) 0.083% nebulizer solution Take 2.5 mg by nebulization every 4 (four) hours. Reported on 09/25/2015    Historical  Provider, MD  allopurinol (ZYLOPRIM) 100 MG tablet TAKE 2 TABLETS BY MOUTH EVERY DAY 08/18/15   Debbrah Alar, NP  carvedilol (COREG) 3.125 MG tablet Take 3.125 mg by mouth daily.    Historical Provider, MD  colchicine 0.6 MG tablet TAKE 2 TABLETS BY MOUTH AT THE START OF GOUT FLARE AND TAKE 1 TABLET 3 HOURS LATER; MAX 3 TABS PER GOUT ATTACK 10/16/15   Debbrah Alar, NP  fluticasone (FLONASE) 50 MCG/ACT nasal spray Place 2 sprays into both nostrils daily. 03/18/15   Debbrah Alar, NP  furosemide (LASIX) 80 MG tablet TAKE 1 TABLET(80 MG) BY MOUTH THREE TIMES DAILY 09/29/15   Debbrah Alar, NP  hydrALAZINE (APRESOLINE) 50 MG tablet Take 50 mg by mouth 2 (two) times daily.    Historical Provider, MD  hydroxychloroquine (PLAQUENIL) 200 MG tablet Take 200 mg by mouth 2 (two) times daily. 09/11/14   Bo Merino, MD  isosorbide mononitrate (IMDUR) 30 MG 24 hr tablet TAKE 1 TABLET BY MOUTH EVERY DAY 08/17/15   Larey Dresser, MD  metolazone (ZAROXOLYN) 5 MG tablet Take 1 tablet (5 mg total) by mouth 2 (two) times a week. AS NEEDED FOR WT GREATER THAN 265 10/27/15   Jolaine Artist, MD  Multiple Vitamin (MULTIVITAMIN WITH MINERALS) TABS tablet Take 1 tablet by mouth daily.    Historical Provider, MD  potassium chloride SA (K-DUR,KLOR-CON) 20 MEQ tablet Take 20 mEq by mouth 3 (three) times daily.    Historical Provider, MD  terazosin (HYTRIN) 1 MG capsule TAKE 1 CAPSULE BY MOUTH EVERY NIGHT AT BEDTIME 10/16/15   Debbrah Alar, NP  warfarin (COUMADIN) 7.5 MG tablet TAKE 1 TABLET BY MOUTH DAILY AS DIRECTED BY COUMADIN CLINIC 09/10/15   Evans Lance, MD    Physical Exam: Filed Vitals:   11/10/15 1800 11/10/15 1815 11/10/15 1830 11/10/15 2016  BP: 111/61  132/74 122/70  Pulse: 55 53 57 58  Temp:    97.8 F (36.6 C)  TempSrc:    Oral  Resp: 24 21 23 17   Height:    6\' 3"  (1.905 m)  Weight:    120.203 kg (265 lb)  SpO2: 85% 95% 86% 100%      Constitutional: NAD, calm,  comfortable. Pale.  Eyes: PERTLA, lids and conjunctivae normal ENMT: Mucous membranes are moist. Posterior pharynx clear of any exudate or lesions.   Neck: normal, supple, no masses, no thyromegaly Respiratory: Rales bilaterally. Normal respiratory effort. No accessory muscle use.  Cardiovascular: Rate ~60 and irregular, no murmurs / rubs / gallops. No carotid bruits.  Abdomen: No distension, no tenderness, no masses palpated. Bowel sounds normal.  Musculoskeletal: no clubbing / cyanosis. No joint deformity upper and lower extremities. Pitting edema of b/l LEs to thighs  Skin: no rashes, lesions, ulcers. No induration Neurologic: CN 2-12 grossly intact. Sensation intact, DTR normal. Strength 5/5 in all 4 limbs.  Psychiatric: Normal judgment and insight. Alert and oriented x 3. Normal mood.     Labs on Admission: I have personally reviewed following labs and imaging studies  CBC:  Recent Labs Lab 11/10/15 1550  WBC 5.9  NEUTROABS 4.7  HGB 6.5*  HCT 20.8*  MCV 88.1  PLT [id number]   Basic Metabolic Panel:  Recent Labs Lab 11/10/15 1550  NA 136  K 3.5  CL 93*  CO2 25  GLUCOSE 108*  BUN 128*  CREATININE 5.93*  CALCIUM 9.4   GFR: Estimated Creatinine Clearance: 15.7 mL/min (by C-G formula based on Cr of 5.93). Liver Function Tests:  Recent Labs Lab 11/10/15 1550  AST 29  ALT 14*  ALKPHOS 114  BILITOT 0.7  PROT 7.1  ALBUMIN 4.0   No results for input(s): LIPASE, AMYLASE in the last 168 hours. No results for input(s): AMMONIA in the last 168 hours. Coagulation Profile:  Recent Labs Lab 11/10/15 1550  INR 5.58*   Cardiac Enzymes:  Recent Labs Lab 11/10/15 1550  TROPONINI 0.10*   BNP (last 3 results) No results for input(s): PROBNP in the last 8760 hours. HbA1C: No results for input(s): HGBA1C in the last 72 hours. CBG: No results for input(s): GLUCAP in the last 168 hours. Lipid Profile: No results for input(s): CHOL, HDL, LDLCALC, TRIG, CHOLHDL,  LDLDIRECT in the last 72 hours. Thyroid Function Tests: No results for input(s): TSH, T4TOTAL, FREET4, T3FREE, THYROIDAB in the last 72 hours. Anemia Panel: No results for input(s): VITAMINB12, FOLATE, FERRITIN, TIBC, IRON, RETICCTPCT in the last 72 hours. Urine analysis:    Component Value Date/Time   COLORURINE YELLOW 11/10/2015 1800   APPEARANCEUR CLEAR 11/10/2015 1800   LABSPEC 1.012 11/10/2015 1800   PHURINE 5.5 11/10/2015 1800   GLUCOSEU NEGATIVE 11/10/2015 1800   GLUCOSEU NEGATIVE 05/05/2015 1135   HGBUR NEGATIVE 11/10/2015 1800   HGBUR negative 09/21/2009 1008   BILIRUBINUR NEGATIVE 11/10/2015 1800   KETONESUR NEGATIVE 11/10/2015 1800   PROTEINUR NEGATIVE 11/10/2015 1800   UROBILINOGEN 0.2 05/05/2015 1135   NITRITE NEGATIVE 11/10/2015 1800   LEUKOCYTESUR NEGATIVE 11/10/2015 1800   Sepsis Labs: @LABRCNTIP (procalcitonin:4,lacticidven:4) )No results found for this or any previous visit (from the past 240 hour(s)).   Radiological Exams on Admission: Dg Chest Port 1 View  11/10/2015  CLINICAL DATA:  Short of breath for 4 day EXAM: PORTABLE CHEST 1 VIEW COMPARISON:  08/19/2015 FINDINGS: The heart is moderately enlarged with a globular appearance. Moderate right pleural effusion and associated pulmonary opacity in the right mid and lower lung zones has developed. There is no pneumothorax. There is no pulmonary edema. There is no pneumothorax or vascular congestion. IMPRESSION: Right pleural effusion and basilar pulmonary opacity. Cardiomegaly is noted. Electronically Signed   By: Marybelle Killings M.D.   On: 11/10/2015 16:12    EKG: Independently reviewed. Junctional rhythm, low-voltage QRS, QTc 555 ms  Assessment/Plan  1. GI bleed with symptomatic anemia  - Suspect upper GI source given nausea and epigastric discomfort - Hold warfarin and pharmacologic VTE ppx for now; SCD only  - Protonix 80 mg IVP given, will continue with 40 mg IV BID  - 2 units pRBCs ordered for immediate  transfusion, with Lasix to be given in between  - RN to place order for post-transfusion H/H   2. Chronic atrial fibrillation with supratherapeutic INR - CHADS-VASc 3 (age, HTN, CHF) - INR 5.58 on admission  - Will not reverse INR at this time as bleeding does not appear to brisk and anticipate it will stop with acid-suppression   - Hold warfarin until INR returns to goal and Hgb stable  - Rate is well-controlled with Coreg, continue   - Monitor on telemetry   3.  AKI on CKD IV - SCr is 5.93 on admission, up from 3.3 on 10/27/15  - Suspect acute injury secondary to blood loss and CHF   - Nephrology is consulting and much appreciated  - AVF in LUE created on 08/26/15, pulsatile with faint bruit, subtle thrill - Diuresing as below   4. Chronic systolic CHF  - TTE ([id number]) with EF 40-45%  - Wt is 265 on admission; this is his goal wt per recent cardiology notes  - There is significant volume overload on exam; AoCKD contributing  - SLIV, strict I/Os, daily wts, fluid-restrict diet  - Lasix increased to 80 mg IV BID  - May require dialysis if fails to diurese adequately  5. Hypertension  - At goal currently  - Continue with diuresis, Coreg    6. Rheumatoid arthritis  - Stable  - Continue home-dose Plaquenil   7. Right pleural effusion  - Likely secondary to vol o/l  - There is an associated opacity that could conceivably represent a PNA with parapneumonic effusion, but clinical findings do not correlate well  - May require thoracentesis if significant respiratory sxs persist following diuresis    8. Prolonged QTc - QTc 555 ms on admission  - Avoiding agents that would further prolong  - Mag level 2.6; potassium at lower end of normal, but hesitant to replete in setting of AKI on CKD IV  - Monitor on telemetry    DVT prophylaxis: Warfarin  Code Status: Full  Family Communication: Friend at bedside  Disposition Plan: Admit to stepdown  Consults called: Nephrology and  cardiology consulted by EDP   Admission status: Inpatient    Vianne Bulls MD [facility] Pager [phone]  If 7PM-7AM, please contact night-coverage [email or web address] Password [facility]  11/10/2015, 9:36 PM

## 2015-11-10 NOTE — Progress Notes (Signed)
ANTICOAGULATION CONSULT NOTE - Initial Consult  Pharmacy Consult for coumadin Indication: atrial fibrillation  Allergies  Allergen Reactions  . Ace Inhibitors Other (See Comments)    Worsening renal insufficiency    Patient Measurements: Height: 6\' 3"  (190.5 cm) Weight: 265 lb (120.203 kg) IBW/kg (Calculated) : 84.5   Vital Signs: Temp: 97.8 F (36.6 C) (04/25 2016) Temp Source: Oral (04/25 2016) BP: 122/70 mmHg (04/25 2016) Pulse Rate: 58 (04/25 2016)  Labs:  Recent Labs  11/10/15 1550  HGB 6.5*  HCT 20.8*  PLT 239  LABPROT 49.0*  INR 5.58*  CREATININE 5.93*  TROPONINI 0.10*    Estimated Creatinine Clearance: 15.7 mL/min (by C-G formula based on Cr of 5.93).   Medical History: Past Medical History  Diagnosis Date  . Nephrolithiasis   . Arthritis     rheumatoid  . Colon polyp 2000  . Atrial fibrillation ([place])   . Unspecified essential hypertension   . Gout   . CHF (congestive heart failure) [facility])     Assessment: 75 yo M on coumadin PTA for afib.  Admission INR 5.58, Hg 6.5, PLTC WNL. Coumadin Clinic 10/02/15: INR 2.2, dose 7.5 mg daily, next coumadin clinic check scheduled for 11/13/15. Med history not yet completed.  Goal of Therapy:  INR 2-3   Plan:  No coumadin tonight Daily INR  Eudelia Bunch, Pharm.D. BP:[phone] 11/10/2015 9:43 PM

## 2015-11-10 NOTE — ED Notes (Signed)
Pt given popsicle and applesauce after approval from Dr. Jeneen Rinks

## 2015-11-11 DIAGNOSIS — R195 Other fecal abnormalities: Secondary | ICD-10-CM

## 2015-11-11 DIAGNOSIS — Z7901 Long term (current) use of anticoagulants: Secondary | ICD-10-CM

## 2015-11-11 DIAGNOSIS — D689 Coagulation defect, unspecified: Secondary | ICD-10-CM

## 2015-11-11 LAB — CBC
HEMATOCRIT: 24.6 % — AB (ref 39.0–52.0)
Hemoglobin: 7.6 g/dL — ABNORMAL LOW (ref 13.0–17.0)
MCH: 26.7 pg (ref 26.0–34.0)
MCHC: 30.9 g/dL (ref 30.0–36.0)
MCV: 86.3 fL (ref 78.0–100.0)
PLATELETS: 221 10*3/uL (ref 150–400)
RBC: 2.85 MIL/uL — ABNORMAL LOW (ref 4.22–5.81)
RDW: 17.3 % — AB (ref 11.5–15.5)
WBC: 5.9 10*3/uL (ref 4.0–10.5)

## 2015-11-11 LAB — BASIC METABOLIC PANEL
Anion gap: 13 (ref 5–15)
BUN: 137 mg/dL — AB (ref 6–20)
CALCIUM: 9.5 mg/dL (ref 8.9–10.3)
CO2: 29 mmol/L (ref 22–32)
CREATININE: 6.1 mg/dL — AB (ref 0.61–1.24)
Chloride: 95 mmol/L — ABNORMAL LOW (ref 101–111)
GFR calc non Af Amer: 8 mL/min — ABNORMAL LOW (ref >60)
GFR, EST AFRICAN AMERICAN: 10 mL/min — AB (ref >60)
GLUCOSE: 104 mg/dL — AB (ref 65–99)
Potassium: 3.7 mmol/L (ref 3.5–5.1)
Sodium: 137 mmol/L (ref 135–145)

## 2015-11-11 LAB — RENAL FUNCTION PANEL
ANION GAP: 13 (ref 5–15)
Albumin: 3.6 g/dL (ref 3.5–5.0)
BUN: 136 mg/dL — AB (ref 6–20)
CO2: 29 mmol/L (ref 22–32)
CREATININE: 5.99 mg/dL — AB (ref 0.61–1.24)
Calcium: 9.5 mg/dL (ref 8.9–10.3)
Chloride: 95 mmol/L — ABNORMAL LOW (ref 101–111)
GFR calc non Af Amer: 8 mL/min — ABNORMAL LOW (ref >60)
GFR, EST AFRICAN AMERICAN: 10 mL/min — AB (ref >60)
GLUCOSE: 107 mg/dL — AB (ref 65–99)
PHOSPHORUS: 5.5 mg/dL — AB (ref 2.5–4.6)
Potassium: 3.6 mmol/L (ref 3.5–5.1)
Sodium: 137 mmol/L (ref 135–145)

## 2015-11-11 LAB — PROTIME-INR
INR: 5.03 (ref 0.00–1.49)
Prothrombin Time: 45.2 seconds — ABNORMAL HIGH (ref 11.6–15.2)

## 2015-11-11 LAB — PREPARE RBC (CROSSMATCH)

## 2015-11-11 MED ORDER — PANTOPRAZOLE SODIUM 40 MG PO TBEC: 40.0000 mg | DELAYED_RELEASE_TABLET | Freq: Every day | ORAL | Status: DC

## 2015-11-11 MED ORDER — PANTOPRAZOLE SODIUM 40 MG PO TBEC
40.0000 mg | DELAYED_RELEASE_TABLET | Freq: Two times a day (BID) | ORAL | Status: DC
  Administered 2015-11-11 – 2015-11-25 (×28): 40 mg via ORAL
  Filled 2015-11-11 (×28): qty 1

## 2015-11-11 MED ORDER — DARBEPOETIN ALFA 100 MCG/0.5ML IJ SOSY
100.0000 ug | PREFILLED_SYRINGE | INTRAMUSCULAR | Status: DC
  Administered 2015-11-18: 100 ug via SUBCUTANEOUS
  Filled 2015-11-11 (×3): qty 0.5

## 2015-11-11 MED ORDER — FUROSEMIDE 10 MG/ML IJ SOLN
120.0000 mg | Freq: Two times a day (BID) | INTRAVENOUS | Status: DC
  Administered 2015-11-11 – 2015-11-12 (×2): 120 mg via INTRAVENOUS
  Filled 2015-11-11 (×3): qty 12

## 2015-11-11 NOTE — Progress Notes (Signed)
On admission pt weighed 265lb on standing scale. However this morning the pt's weight was 273lb on the standing scale. Pt believes he was not standing on the scale completely during the admission weight.

## 2015-11-11 NOTE — Progress Notes (Signed)
[place] for coumadin Indication: atrial fibrillation  Allergies  Allergen Reactions  . Ace Inhibitors Other (See Comments)    Worsening renal insufficiency    Labs:  Recent Labs  11/10/15 1550 11/11/15 0821  HGB 6.5* 7.6*  HCT 20.8* 24.6*  PLT 239 221  LABPROT 49.0* 45.2*  INR 5.58* 5.03*  CREATININE 5.93* 5.99*  6.10*  TROPONINI 0.10*  --     Estimated Creatinine Clearance: 15.5 mL/min (by C-G formula based on Cr of 6.1).   Assessment: 75 yo M on coumadin PTA for afib.   INR still elevated  Goal of Therapy:  INR 2-3   Plan:  No coumadin tonight Daily INR  Thank you Anette Guarneri, PharmD [phone] 11/11/2015 12:01 PM

## 2015-11-11 NOTE — Progress Notes (Signed)
Patient ID: Bruce Cole, male   DOB: 03/18/1941, 75 y.o.   MRN: [name]    PROGRESS NOTE    Bruce Cole  [name] DOB: [birth year] DOA: 11/10/2015  PCP: Nance Pear., NP   Outpatient Specialists:   Brief Narrative:  75 y.o. male with rheumatoid arthritis, chronic atrial fibrillation on warfarin, chronic systolic CHF, and chronic kidney disease stage IV who presented to the ED at [facility] for evaluation of dyspnea and malaise 4 days duration. Patient has been followed closely by outpatient cardiologist and nephrologist and had AV fistula created in the left upper extremity on 08/26/2015, but has not yet required dialysis.   ED Course: Upon arrival to the [facility] ED, Chest x-ray demonstrated a right sided pleural effusion with an associated opacity in the right base. Transferred to [facility] for admission.  Assessment & Plan:   1. GI bleed with symptomatic anemia  - Suspect upper GI source given nausea and epigastric discomfort - Hold warfarin and pharmacologic VTE ppx for now; SCD only  - Protonix 40 mg IV BID  - 2 units pRBCs ordered for immediate transfusion, with Lasix to be given in between  - GI consulted for assistance as FOBT noted + - anemia panel ordered   2. Chronic atrial fibrillation with supratherapeutic INR - CHADS-VASc 3 (age, HTN, CHF) - INR 5.58 on admission   - Hold warfarin until INR returns to goal and Hgb stable  - Rate is well-controlled with Coreg, continue  - Monitor on telemetry  - cardiology consulted, assistance is appreciated   3. AKI on CKD IV - SCr is 5.93 on admission, up from 3.3 on 10/27/15  - Suspect acute injury secondary to blood loss and CHF  - Nephrology is consulting and much appreciated  - AVF in LUE created on 08/26/15, pulsatile with faint bruit, subtle thrill - Diuresing as below   4. Chronic systolic CHF  - TTE ([id number]) with EF 40-45%  - Wt is 265 on admission; this is his  goal wt per recent cardiology notes  - strict I/Os, daily wts, fluid-restrict diet  - Lasix increased to 80 mg IV BID  - May require dialysis if fails to diurese adequately - appreciate nephrology team assistance   5. Hypertension, essential - At goal currently  - Continue with diuresis, Coreg   6. Rheumatoid arthritis  - Stable  - Continue home-dose Plaquenil   7. Right pleural effusion  - Likely secondary to vol o/l  - There is an associated opacity that could conceivably represent a PNA with parapneumonic effusion, but clinical findings do not correlate well  - May require thoracentesis if significant respiratory sxs persist following diuresis   8. Prolonged QTc - QTc 555 ms on admission  - Avoiding agents that would further prolong  - Monitor on telemetry   9. Obesity  - Body mass index is 34.15 kg/(m^2).  DVT prophylaxis: SCD's for now  Code Status: Full Family Communication: Patient at bedside  Disposition Plan: pending improvement in symptoms   Consultants:   Nephrology  Cardiology   GI  Procedures:   None   Antimicrobials:   None    Subjective: Denies chest pain or shortness of breath.   Objective: Filed Vitals:   11/11/15 0333 11/11/15 0351 11/11/15 0353 11/11/15 0610  BP: 113/53 136/87  133/76  Pulse: 58 66  58  Temp: 98.3 F (36.8 C) 98.2 F (36.8 C)  98.3 F (36.8 C)  TempSrc: Oral Oral  Oral  Resp: 24 18  17   Height:      Weight:   123.923 kg (273 lb 3.2 oz)   SpO2: 94% 100%  97%    Intake/Output Summary (Last 24 hours) at 11/11/15 0648 Last data filed at 11/11/15 0606  Gross per 24 hour  Intake    892 ml  Output    325 ml  Net    567 ml   Filed Weights   11/10/15 1519 11/10/15 2016 11/11/15 0353  Weight: 122.471 kg (270 lb) 120.203 kg (265 lb) 123.923 kg (273 lb 3.2 oz)    Examination:  General exam: Appears calm and comfortable  Respiratory system: Clear to auscultation. Respiratory effort  normal. Cardiovascular system: S1 & S2 heard, RRR. No JVD, rubs, gallops or clicks. No pedal edema. Gastrointestinal system: Abdomen is nondistended, soft and nontender.  Central nervous system: Alert and oriented. No focal neurological deficits. Extremities: LE edema bilateral +2 with chronic venous stasis changes  Skin: No rashes, lesions or ulcers Psychiatry: Judgement and insight appear normal. Mood & affect appropriate.   Data Reviewed: I have personally reviewed following labs and imaging studies  CBC:  Recent Labs Lab 11/10/15 1550  WBC 5.9  NEUTROABS 4.7  HGB 6.5*  HCT 20.8*  MCV 88.1  PLT [id number]   Basic Metabolic Panel:  Recent Labs Lab 11/10/15 1550 11/10/15 2144  NA 136  --   K 3.5  --   CL 93*  --   CO2 25  --   GLUCOSE 108*  --   BUN 128*  --   CREATININE 5.93*  --   CALCIUM 9.4  --   MG  --  2.6*   Liver Function Tests:  Recent Labs Lab 11/10/15 1550  AST 29  ALT 14*  ALKPHOS 114  BILITOT 0.7  PROT 7.1  ALBUMIN 4.0   Coagulation Profile:  Recent Labs Lab 11/10/15 1550  INR 5.58*   Cardiac Enzymes:  Recent Labs Lab 11/10/15 1550  TROPONINI 0.10*   Urine analysis:    Component Value Date/Time   COLORURINE YELLOW 11/10/2015 1800   APPEARANCEUR CLEAR 11/10/2015 1800   LABSPEC 1.012 11/10/2015 1800   PHURINE 5.5 11/10/2015 1800   GLUCOSEU NEGATIVE 11/10/2015 1800   GLUCOSEU NEGATIVE 05/05/2015 1135   HGBUR NEGATIVE 11/10/2015 1800   HGBUR negative 09/21/2009 1008   BILIRUBINUR NEGATIVE 11/10/2015 1800   KETONESUR NEGATIVE 11/10/2015 1800   PROTEINUR NEGATIVE 11/10/2015 1800   UROBILINOGEN 0.2 05/05/2015 1135   NITRITE NEGATIVE 11/10/2015 1800   LEUKOCYTESUR NEGATIVE 11/10/2015 1800    Recent Results (from the past 240 hour(s))  MRSA PCR Screening     Status: None   Collection Time: 11/10/15  8:50 PM  Result Value Ref Range Status   MRSA by PCR NEGATIVE NEGATIVE Final    Comment:        The GeneXpert MRSA Assay  (FDA approved for NASAL specimens only), is one component of a comprehensive MRSA colonization surveillance program. It is not intended to diagnose MRSA infection nor to guide or monitor treatment for MRSA infections.      Radiology Studies: Dg Chest Port 1 View 11/10/2015  Right pleural effusion and basilar pulmonary opacity. Cardiomegaly is noted.  Scheduled Meds: . allopurinol  200 mg Oral Daily  . carvedilol  3.125 mg Oral Daily  . fluticasone  2 spray Each Nare Daily  . furosemide  80 mg Intravenous BID  . hydrALAZINE  50 mg Oral BID  . hydroxychloroquine  200 mg Oral BID  . isosorbide mononitrate  30 mg Oral Daily  . multivitamin with minerals  1 tablet Oral Daily  . pantoprazole (PROTONIX) IV  40 mg Intravenous Q12H  . sodium chloride flush  3 mL Intravenous Q12H  . terazosin  1 mg Oral QHS   Continuous Infusions:    LOS: 1 day   Time spent: 20 minutes   Faye Ramsay, MD [facility] Pager [phone]  If 7PM-7AM, please contact night-coverage [email or web address] Password [facility] 11/11/2015, 6:48 AM

## 2015-11-11 NOTE — Progress Notes (Signed)
Nutrition Brief Note  Patient identified on the Malnutrition Screening Tool (MST) Report. 6% weight loss over the past 3 months is not significant for the time frame.   Wt Readings from Last 15 Encounters:  11/11/15 273 lb 3.2 oz (123.923 kg)  11/04/15 273 lb 12.8 oz (124.195 kg)  10/27/15 270 lb (122.471 kg)  10/27/15 273 lb (123.832 kg)  09/29/15 264 lb 4.8 oz (119.886 kg)  09/25/15 259 lb (117.482 kg)  09/11/15 271 lb 4 oz (123.038 kg)  08/31/15 283 lb 12.8 oz (128.731 kg)  08/28/15 281 lb 1.6 oz (127.506 kg)  08/17/15 291 lb (131.997 kg)  08/12/15 280 lb (127.007 kg)  08/04/15 289 lb (131.09 kg)  05/19/15 296 lb 3.2 oz (134.355 kg)  05/08/15 284 lb 2 oz (128.878 kg)  05/05/15 288 lb 9.6 oz (130.908 kg)    Body mass index is 34.15 kg/(m^2). Patient meets criteria for class 1 obesity based on current BMI.   Current diet order is renal, patient is consuming approximately 75% of meals at this time. Labs and medications reviewed.   No nutrition interventions warranted at this time. If nutrition issues arise, please consult RD.   Molli Barrows, RD, LDN, [place] Pager [phone] After Hours Pager [phone]

## 2015-11-11 NOTE — Consult Note (Signed)
Bruce Cole Consult: 10:20 AM 11/11/2015  LOS: 1 day    Referring Provider: Dr Doyle Askew  Primary Care Physician:  Nance Pear., NP Primary Gastroenterologist:  Dr. Verl Blalock.      Reason for Consultation:  Anemia.  FOBT +.   HPI: Bruce Cole is a 75 y.o. male.  For many years patient has been on  chronic Coumadin for A fib.  Stage 4 CKD, fistula placed 08/2015.  Rheumatoid arthritis. HTN.   Cholecystectomy 1994.  Spinal and total hip replacement surgeries.  Right pleural effusion and near total collapse of RLL, 4.8 cm descending thoracic aortic aneurysm, 4.1 ascending thoracic aorta aneurysm per CT of 08/18/15.  History of colon polyps which date back to the 1970s, per patient reported a history. 08/2009 Colonoscopy: surveillance of polyps (2000).  Normal to cecum.   ~ 4 days dyspnea, progressive weakness and fatigue, malaise.  Progressive lower extremity edema (this is chronic but has increased) and weight gain. Orthopnea for at least a couple of months leading to his sleeping in a chair rather than the bed. Junctional rhythm on EKG.  Right pleural effusion.  BUN/Creat: 137/6.1, 90/3.3 on 10/27/15.  Hgb 6.5, was ~ 9.5 in 08/2015.  MCV in the 80s. INR 5.5.  Troponin 0.10. Tool is FOBT positive but not described as melenic or bloody per rectal exam in the emergency department.  Patient gives a history of dark stools, sometimes formed, sometimes tarry. Occurring over the last month. Currently appetite is good but for a few days in February it was diminished.  He has had postprandial nausea and dry heaves for a couple of weeks.  No abdominal pain. No dysphagia. No blood per rectum.  A few weeks ago he had limited epistaxis. This has not recurred.  f he has pain, he uses tramadol when necessary. No aspirin  or NSAIDs.  Although he is followed by outpatient renal M.D., the patient has never undergone outpatient infusions of parenteral iron ortreatment with Epogen-type medications/infusions.  Patient is not on proton pump inhibitor or H2 blocker therapies at home. On 08/25/15 his INR reached 3.7 and he was advised to hold the Coumadin for one dose and resume Coumadin at the same dose. A month later, the INR was 2.2.    Family history positive for ulcer disease in his father.No family history of colorectal disease or cancers. Patient has interesting social history. 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 Patient doesn't drink alcohol or smoke cigarettes.  Past Medical History  Diagnosis Date  . Nephrolithiasis   . Arthritis     rheumatoid  . Colon polyp 2000  . Atrial fibrillation ([place])   . Unspecified essential hypertension   . Gout   . CHF (congestive heart failure) [facility])     Past Surgical History  Procedure Laterality Date  . Spine surgery      x 2  . Joint replacement      Total  L-Hip replacement, Right Knee 10/20/09  . Cholecystectomy  1994  . Left and right heart catheterization with coronary angiogram N/A 02/22/2013    Procedure: LEFT AND RIGHT HEART CATHETERIZATION WITH CORONARY ANGIOGRAM;  Surgeon: Jolaine Artist, MD;  Location: [facility] CATH LAB;  Service: Cardiovascular;  Laterality: N/A;  . Av fistula placement Left 08/26/2015    Procedure: LEFT RADIOCEPHALIC FISTULA CREATION;  Surgeon: Rosetta Posner, MD;  Location: [facility];  Service: Vascular;  Laterality: Left;    Prior to Admission medications   Medication Sig Start Date End Date Taking? Authorizing Provider  albuterol (PROVENTIL) (2.5 MG/3ML) 0.083% nebulizer solution Take 2.5 mg by nebulization every 4 (four) hours as needed for shortness of breath. Reported on 09/25/2015   Yes Historical Provider, MD    allopurinol (ZYLOPRIM) 100 MG tablet TAKE 2 TABLETS BY MOUTH EVERY DAY 08/18/15  Yes Debbrah Alar, NP  carvedilol (COREG) 3.125 MG tablet Take 3.125 mg by mouth daily.   Yes Historical Provider, MD  colchicine 0.6 MG tablet Take 0.6 mg by mouth daily.   Yes Historical Provider, MD  fluticasone (FLONASE) 50 MCG/ACT nasal spray Place 2 sprays into both nostrils daily. 03/18/15  Yes Debbrah Alar, NP  furosemide (LASIX) 80 MG tablet TAKE 1 TABLET(80 MG) BY MOUTH THREE TIMES DAILY 09/29/15  Yes Debbrah Alar, NP  hydrALAZINE (APRESOLINE) 50 MG tablet Take 50 mg by mouth 2 (two) times daily.   Yes Historical Provider, MD  hydroxychloroquine (PLAQUENIL) 200 MG tablet Take 200 mg by mouth 2 (two) times daily. 09/11/14  Yes Bo Merino, MD  isosorbide mononitrate (IMDUR) 30 MG 24 hr tablet TAKE 1 TABLET BY MOUTH EVERY DAY 08/17/15  Yes Larey Dresser, MD  metolazone (ZAROXOLYN) 5 MG tablet Take 1 tablet (5 mg total) by mouth 2 (two) times a week. AS NEEDED FOR WT GREATER THAN 265 10/27/15  Yes Jolaine Artist, MD  Multiple Vitamin (MULTIVITAMIN WITH MINERALS) TABS tablet Take 1 tablet by mouth daily.   Yes Historical Provider, MD  potassium chloride SA (K-DUR,KLOR-CON) 20 MEQ tablet Take 20 mEq by mouth 3 (three) times daily.   Yes Historical Provider, MD  terazosin (HYTRIN) 1 MG capsule TAKE 1 CAPSULE BY MOUTH EVERY NIGHT AT BEDTIME 10/16/15  Yes Debbrah Alar, NP  colchicine 0.6 MG tablet TAKE 2 TABLETS BY MOUTH AT THE START OF GOUT FLARE AND TAKE 1 TABLET 3 HOURS LATER; MAX 3 TABS PER GOUT ATTACK Patient not taking: Reported on 11/10/2015 10/16/15   Debbrah Alar, NP  warfarin (COUMADIN) 7.5 MG tablet TAKE 1 TABLET BY MOUTH DAILY AS DIRECTED BY COUMADIN CLINIC Patient not taking: Reported on 11/10/2015 09/10/15   Evans Lance, MD    Scheduled Meds: . allopurinol  200 mg Oral Daily  . carvedilol  3.125 mg Oral Daily  . fluticasone  2 spray Each Nare Daily  . furosemide  80  mg Intravenous BID  . hydrALAZINE  50 mg Oral BID  . hydroxychloroquine  200 mg Oral BID  . isosorbide mononitrate  30 mg Oral Daily  . multivitamin with minerals  1 tablet Oral Daily  . pantoprazole (PROTONIX) IV  40 mg Intravenous Q12H  . sodium chloride flush  3 mL Intravenous Q12H  . terazosin  1 mg Oral QHS   Infusions:   PRN Meds: sodium chloride, acetaminophen, albuterol, ondansetron (ZOFRAN) IV, sodium chloride flush   Allergies as of 11/10/2015 - Review Complete 11/10/2015  Allergen Reaction Noted  . Ace inhibitors Other (See  Comments) 03/26/2014    Family History  Problem Relation Age of Onset  . Hypertension Mother   . Arthritis Mother     ?RA  . Hypertension Father     Social History   Social History  . Marital Status: Single    Spouse Name: N/A  . Number of Children: 3  . Years of Education: N/A   Occupational History  .     Social History Main Topics  . Smoking status: Never Smoker   . Smokeless tobacco: Never Used  . Alcohol Use: Yes     Comment: occasional  . Drug Use: No  . Sexual Activity: Not on file   Other Topics Concern  . Not on file   Social History Narrative   Former [place] [employer] [place]: Constitutional:  Generally ambulatory with the assistance of a cane. ENT:  No nose bleeds Pulm:  Per HPI.  Cough is foamy, white, non-purulent, nonbloody CV:  No palpitations, no LE edema.  o chest pain GU:  No hematuria, no frequency GI:  Per HPI Heme:  No previous issues with anemia. No previous transfusions.   Transfusions:  No previous transfusions Neuro:  No headaches, no peripheral tingling or numbness Derm:  No itching, no rash or sores.  Endocrine:  No sweats or chills.  No polyuria or dysuria Immunization:  Did not inquire. Travel:  None beyond local counties in last few months.    PHYSICAL EXAM: Vital signs in last 24 hours: Filed Vitals:   11/11/15 0610 11/11/15 0800  BP: 133/76 130/80    Pulse: 58 60  Temp: 98.3 F (36.8 C) 98 F (36.7 C)  Resp: 17 19   Wt Readings from Last 3 Encounters:  11/11/15 123.923 kg (273 lb 3.2 oz)  11/04/15 124.195 kg (273 lb 12.8 oz)  10/27/15 122.471 kg (270 lb)   General: comfortable, pleasant, robust appearing AAM. Does not appear his stated age Head:  No facial asymmetry, swelling or signs of trauma  Eyes:  No scleral icterus, no conjunctival pallor. Ears:  Not HOH  Nose:  No discharge Mouth:  Good dentition though he has a lot of plaque and tartar buildup. Oral mucosa is clear, moist. Neck:  No TMG, no masses. Lungs:  Clear to auscultation and percussion bilaterally but the breath sounds on the right are diminished. There is no dyspnea, no cough. Heart: RRR with 1/6 SM.  S1/s2 audible Abdomen:   Ventral hernia present.  No tenderness. Active bowel sounds.  Here is a distinct ridge extending across the upper abdomen bilaterally. It reaches a couple of inches below the xiphoid process.. Rectal: deferred.   Musc/Skeltl: some arthritic changes in the hands/fingers. No contracture deformities. Extremities:  Extensive edema of both lower extremities. It pits only slightly, 1+, in the pretibial region but not on the feet.  Neurologic:  Patient is alert. He is oriented 3. He is a good historian.He is able to move all 4 limbs, limb strength was not tested. No tremor. Skin:  No telangiectasia, rashes or sores. Tattoos:  nonobserved   Psych:  Comment, pleasant, cooperative.  Intake/Output from previous day: 04/25 0701 - 04/26 0700 In: 892 [P.O.:222; Blood:670] Out: 325 [Urine:325] Intake/Output this shift: Total I/O In: 240 [P.O.:240] Out: -   LAB RESULTS:  Recent Labs  11/10/15 1550 11/11/15 0821  WBC 5.9 5.9  HGB 6.5* 7.6*  HCT 20.8* 24.6*  PLT 239 221   BMET Lab Results  Component Value Date   NA 137 11/11/2015   NA 137 11/11/2015   NA 136 11/10/2015   K 3.7 11/11/2015   K 3.6 11/11/2015   K 3.5 11/10/2015   CL  95* 11/11/2015   CL 95* 11/11/2015   CL 93* 11/10/2015   CO2 29 11/11/2015   CO2 29 11/11/2015   CO2 25 11/10/2015   GLUCOSE 104* 11/11/2015   GLUCOSE 107* 11/11/2015   GLUCOSE 108* 11/10/2015   BUN 137* 11/11/2015   BUN 136* 11/11/2015   BUN 128* 11/10/2015   CREATININE 6.10* 11/11/2015   CREATININE 5.99* 11/11/2015   CREATININE 5.93* 11/10/2015   CALCIUM 9.5 11/11/2015   CALCIUM 9.5 11/11/2015   CALCIUM 9.4 11/10/2015   LFT  Recent Labs  11/10/15 1550 11/11/15 0821  PROT 7.1  --   ALBUMIN 4.0 3.6  AST 29  --   ALT 14*  --   ALKPHOS 114  --   BILITOT 0.7  --   BILIDIR 0.2  --   IBILI 0.5  --    PT/INR Lab Results  Component Value Date   INR 5.03* 11/11/2015   INR 5.58* 11/10/2015   INR 2.2 10/02/2015   Hepatitis Panel No results for input(s): HEPBSAG, HCVAB, HEPAIGM, HEPBIGM in the last 72 hours. C-Diff No components found for: CDIFF Lipase     Component Value Date/Time   LIPASE 47 04/15/2015 0853    Drugs of Abuse  No results found for: LABOPIA, COCAINSCRNUR, LABBENZ, AMPHETMU, THCU, LABBARB   RADIOLOGY STUDIES: Dg Chest Port 1 View  11/10/2015  CLINICAL DATA:  Short of breath for 4 day EXAM: PORTABLE CHEST 1 VIEW COMPARISON:  08/19/2015 FINDINGS: The heart is moderately enlarged with a globular appearance. Moderate right pleural effusion and associated pulmonary opacity in the right mid and lower lung zones has developed. There is no pneumothorax. There is no pulmonary edema. There is no pneumothorax or vascular congestion. IMPRESSION: Right pleural effusion and basilar pulmonary opacity. Cardiomegaly is noted. Electronically Signed   By: Marybelle Killings M.D.   On: 11/10/2015 16:12    ENDOSCOPIC STUDIES: Peer HPI  IMPRESSION:   *  Normocytic anemia inpatient on chronic Coumadin with supratherapeutic INR.   He is FOBT positive and has symptoms suggestive of GERD and/or ulcer disease consisting of dry heaves.  Also reports dark stools, possibly some of  these melenic, over the last month.  Also suspect anemia of chronic disease in patient with late stage chronic kidney disease.  *  History of colon polyps dating back a few decades. Last documented polyps (of unclear type) were in 2000. No recurrent polyps on the 2011 colonoscopy  *  Long-standing atrial fibrillation, on Coumadin. INR supratherapeutic.  *  Stage IV chronic kidney disease.  BUN/creatinine increased significantly compared with 2 weeks ago.  *  levation of single troponin I to 0.10. BNP elevated and clinically looks volume overloaded as well as having right pleural effusion on x-ray  PLAN:     *  Per Dr. Scarlette Shorts.  Any endoscopic procedures will need to be performed once his INR has decreased.  Switch to oral Protonix.   Azucena Freed  11/11/2015, 10:20 AM Pager: [phone]  GI ATTENDING  History, laboratories, previous endoscopic procedures and x-rays reviewed. Patient seen and examined. Agree with comprehensive consultation note as above. We are asked to the patient for anemia and Hemoccult-positive stool. Report [place] stools. Colonoscopy in 2000 and again in 2011 (unremarkable). His anemia is almost certainly  multifactorial but mostly secondary to advanced chronic renal disease. His prothrombin time was supratherapeutic. I would plan EGD with reports of dark stools. We will set this up when his prothrombin time is more reasonable. Not sure we will find find much, but this is reasonable given the potential need for long-term anticoagulation. He is high risk. I am not planning repeat colonoscopy in this patient given his overall medical issues and most recent colonoscopies as described.  Docia Chuck. Geri Seminole., M.D. [facility]

## 2015-11-11 NOTE — Consult Note (Signed)
Subjective:  Doing well this AM. Appears worn out, but oriented. Objective Vital signs in last 24 hours: Filed Vitals:   11/11/15 0353 11/11/15 0610 11/11/15 0800 11/11/15 1223  BP:  133/76 130/80 130/74  Pulse:  58 60 63  Temp:  98.3 F (36.8 C) 98 F (36.7 C) 98.2 F (36.8 C)  TempSrc:  Oral Oral Oral  Resp:  17 19 19   Height:      Weight: 273 lb 3.2 oz (123.923 kg)     SpO2:  97% 98% 98%   Weight change:   Intake/Output Summary (Last 24 hours) at 11/11/15 1435 Last data filed at 11/11/15 0848  Gross per 24 hour  Intake   1132 ml  Output    325 ml  Net    807 ml    Assessment/ Plan: Pt is a 75 y.o. yo male who was admitted on 11/10/2015 with stage 4 CKD (creat 3.3 on 10/27/15), hx of left Cimino AVF 08/26/15, and multiple medical problems including afib on coumadin, CHF, who presented to [facility] ED due to SOB, edema, CXR showed an increased right sided pleural effusion He previously had a thoracentesis with neg cytology on 08/19/15. In ED today labs showed acute anemia hgb 6.5, FOBT+, INR supratherapeutic 5.58. Elevated cr 5.93 and BUN of 128.  Assessment/Plan: 1. Stage 4 CKD with AKI: likely 2/2 hypotension and anemia. BUN elevated to 136 likely 2/2 GI bleed and RBC digestion. UOP 351ml. Continue diuresis with Lasix 80mg  IV BID >> may want to increase. Watch renal function tolerance.  2. Acute Blood Loss Anemia: GI on board. BUN elevated, likely from RBC digestion. Start Aranesp. Transfuse as needed.  3. Volume: overloaded. Continue to diurese, especially while needing transfusions.   Georges Lynch    Labs: Basic Metabolic Panel:  Recent Labs Lab 11/10/15 1550 11/11/15 0821  NA 136 137  137  K 3.5 3.6  3.7  CL 93* 95*  95*  CO2 25 29  29   GLUCOSE 108* 107*  104*  BUN 128* 136*  137*  CREATININE 5.93* 5.99*  6.10*  CALCIUM 9.4 9.5  9.5  PHOS  --  5.5*   Liver Function Tests:  Recent Labs Lab 11/10/15 1550 11/11/15 0821  AST 29  --   ALT 14*  --   ALKPHOS  114  --   BILITOT 0.7  --   PROT 7.1  --   ALBUMIN 4.0 3.6   No results for input(s): LIPASE, AMYLASE in the last 168 hours. No results for input(s): AMMONIA in the last 168 hours. CBC:  Recent Labs Lab 11/10/15 1550 11/11/15 0821  WBC 5.9 5.9  NEUTROABS 4.7  --   HGB 6.5* 7.6*  HCT 20.8* 24.6*  MCV 88.1 86.3  PLT 239 221   Cardiac Enzymes:  Recent Labs Lab 11/10/15 1550  TROPONINI 0.10*   CBG: No results for input(s): GLUCAP in the last 168 hours.  Iron Studies: No results for input(s): IRON, TIBC, TRANSFERRIN, FERRITIN in the last 72 hours. Studies/Results: Dg Chest Port 1 View  11/10/2015  CLINICAL DATA:  Short of breath for 4 day EXAM: PORTABLE CHEST 1 VIEW COMPARISON:  08/19/2015 FINDINGS: The heart is moderately enlarged with a globular appearance. Moderate right pleural effusion and associated pulmonary opacity in the right mid and lower lung zones has developed. There is no pneumothorax. There is no pulmonary edema. There is no pneumothorax or vascular congestion. IMPRESSION: Right pleural effusion and basilar pulmonary opacity. Cardiomegaly is noted. Electronically  Signed   By: Marybelle Killings M.D.   On: 11/10/2015 16:12   Medications: Infusions:    Scheduled Medications: . allopurinol  200 mg Oral Daily  . carvedilol  3.125 mg Oral Daily  . fluticasone  2 spray Each Nare Daily  . furosemide  80 mg Intravenous BID  . hydrALAZINE  50 mg Oral BID  . hydroxychloroquine  200 mg Oral BID  . isosorbide mononitrate  30 mg Oral Daily  . multivitamin with minerals  1 tablet Oral Daily  . pantoprazole  40 mg Oral BID  . sodium chloride flush  3 mL Intravenous Q12H  . terazosin  1 mg Oral QHS    have reviewed scheduled and prn medications.  Physical Exam: General -- sitting up in bed, pleasant and cooperative. NAD Chest -- good expansion. Lungs clear to auscultation. Cardiac -- RRR. No murmurs noted.  Abdomen -- soft, nontender. Ventral hernia present. Bowel  sounds present. Extremeties - no tenderness or effusions noted. +1 edema. Fistula present in LUE, but not well matured. ROM good. Dorsalis pedis pulses present and symmetrical.    Elberta Leatherwood, MD,MS,  PGY2 11/11/2015 2:73 PM    LOS: 1 day      I have seen and examined this patient and agree with plan as outlined by Dr. Alease Frame.  Scr improved somewhat.  Will cont with diuresis and will start Aranesp for anemia (he has not received any ESA in the past).  His AVF is small in caliber.  Will ask [facility]VS to evaluate while he remains an inpatient. Governor Rooks Kindred Heying,MD 11/11/2015 2:44 PM

## 2015-11-12 ENCOUNTER — Inpatient Hospital Stay (HOSPITAL_COMMUNITY): Payer: Medicare Other

## 2015-11-12 DIAGNOSIS — N179 Acute kidney failure, unspecified: Secondary | ICD-10-CM

## 2015-11-12 DIAGNOSIS — N186 End stage renal disease: Secondary | ICD-10-CM

## 2015-11-12 DIAGNOSIS — I482 Chronic atrial fibrillation: Secondary | ICD-10-CM

## 2015-11-12 DIAGNOSIS — I5023 Acute on chronic systolic (congestive) heart failure: Secondary | ICD-10-CM

## 2015-11-12 DIAGNOSIS — D689 Coagulation defect, unspecified: Secondary | ICD-10-CM | POA: Insufficient documentation

## 2015-11-12 DIAGNOSIS — N184 Chronic kidney disease, stage 4 (severe): Secondary | ICD-10-CM

## 2015-11-12 DIAGNOSIS — R195 Other fecal abnormalities: Secondary | ICD-10-CM | POA: Insufficient documentation

## 2015-11-12 DIAGNOSIS — D649 Anemia, unspecified: Secondary | ICD-10-CM

## 2015-11-12 LAB — RETICULOCYTES
RBC.: 2.79 MIL/uL — ABNORMAL LOW (ref 4.22–5.81)
RETIC CT PCT: 2.4 % (ref 0.4–3.1)
Retic Count, Absolute: 67 10*3/uL (ref 19.0–186.0)

## 2015-11-12 LAB — CBC
HCT: 24.4 % — ABNORMAL LOW (ref 39.0–52.0)
Hemoglobin: 7.6 g/dL — ABNORMAL LOW (ref 13.0–17.0)
MCH: 27.2 pg (ref 26.0–34.0)
MCHC: 31.1 g/dL (ref 30.0–36.0)
MCV: 87.5 fL (ref 78.0–100.0)
PLATELETS: 221 10*3/uL (ref 150–400)
RBC: 2.79 MIL/uL — ABNORMAL LOW (ref 4.22–5.81)
RDW: 17.6 % — AB (ref 11.5–15.5)
WBC: 6 10*3/uL (ref 4.0–10.5)

## 2015-11-12 LAB — TYPE AND SCREEN
ABO/RH(D): O POS
Antibody Screen: NEGATIVE
Unit division: 0
Unit division: 0

## 2015-11-12 LAB — IRON AND TIBC
Iron: 14 ug/dL — ABNORMAL LOW (ref 45–182)
Saturation Ratios: 4 % — ABNORMAL LOW (ref 17.9–39.5)
TIBC: 392 ug/dL (ref 250–450)
UIBC: 378 ug/dL

## 2015-11-12 LAB — RENAL FUNCTION PANEL
Albumin: 3.7 g/dL (ref 3.5–5.0)
Anion gap: 14 (ref 5–15)
BUN: 136 mg/dL — AB (ref 6–20)
CHLORIDE: 93 mmol/L — AB (ref 101–111)
CO2: 30 mmol/L (ref 22–32)
CREATININE: 6.77 mg/dL — AB (ref 0.61–1.24)
Calcium: 9.4 mg/dL (ref 8.9–10.3)
GFR calc Af Amer: 8 mL/min — ABNORMAL LOW (ref >60)
GFR, EST NON AFRICAN AMERICAN: 7 mL/min — AB (ref >60)
GLUCOSE: 118 mg/dL — AB (ref 65–99)
Phosphorus: 6 mg/dL — ABNORMAL HIGH (ref 2.5–4.6)
Potassium: 3.7 mmol/L (ref 3.5–5.1)
Sodium: 137 mmol/L (ref 135–145)

## 2015-11-12 LAB — FOLATE: Folate: 28.5 ng/mL (ref >5.9)

## 2015-11-12 LAB — BLOOD GAS, ARTERIAL
Acid-Base Excess: 4.8 mmol/L — ABNORMAL HIGH (ref 0.0–2.0)
Bicarbonate: 29.8 mEq/L — ABNORMAL HIGH (ref 20.0–24.0)
O2 Content: 3 L/min
O2 Saturation: 94.9 %
PCO2 ART: 52.8 mmHg — AB (ref 35.0–45.0)
PH ART: 7.37 (ref 7.350–7.450)
Patient temperature: 98.6
TCO2: 31.4 mmol/L (ref 0–100)
pO2, Arterial: 79.3 mmHg — ABNORMAL LOW (ref 80.0–100.0)

## 2015-11-12 LAB — VITAMIN B12: Vitamin B-12: 1444 pg/mL — ABNORMAL HIGH (ref 180–914)

## 2015-11-12 LAB — FERRITIN: FERRITIN: 18 ng/mL — AB (ref 24–336)

## 2015-11-12 LAB — PROTIME-INR
INR: 3.8 — AB (ref 0.00–1.49)
Prothrombin Time: 36.6 seconds — ABNORMAL HIGH (ref 11.6–15.2)

## 2015-11-12 MED ORDER — ZOLPIDEM TARTRATE 5 MG PO TABS
5.0000 mg | ORAL_TABLET | Freq: Every evening | ORAL | Status: DC | PRN
  Administered 2015-11-12 – 2015-11-17 (×6): 5 mg via ORAL
  Filled 2015-11-12 (×6): qty 1

## 2015-11-12 MED ORDER — SEVELAMER CARBONATE 800 MG PO TABS
800.0000 mg | ORAL_TABLET | Freq: Three times a day (TID) | ORAL | Status: DC
  Administered 2015-11-12 – 2015-11-25 (×25): 800 mg via ORAL
  Filled 2015-11-12 (×26): qty 1

## 2015-11-12 MED ORDER — ALBUTEROL SULFATE (2.5 MG/3ML) 0.083% IN NEBU
INHALATION_SOLUTION | RESPIRATORY_TRACT | Status: AC
  Administered 2015-11-12: 2.5 mg via RESPIRATORY_TRACT
  Filled 2015-11-12: qty 3

## 2015-11-12 MED ORDER — HEPARIN SODIUM (PORCINE) 1000 UNIT/ML DIALYSIS
3000.0000 [IU] | INTRAMUSCULAR | Status: DC | PRN
  Filled 2015-11-12: qty 3

## 2015-11-12 MED ORDER — FUROSEMIDE 10 MG/ML IJ SOLN
160.0000 mg | Freq: Two times a day (BID) | INTRAVENOUS | Status: DC
  Administered 2015-11-12 – 2015-11-15 (×7): 160 mg via INTRAVENOUS
  Filled 2015-11-12 (×9): qty 16

## 2015-11-12 MED ORDER — PROMETHAZINE HCL 25 MG PO TABS
25.0000 mg | ORAL_TABLET | Freq: Four times a day (QID) | ORAL | Status: DC | PRN
  Administered 2015-11-12 – 2015-11-14 (×2): 25 mg via ORAL
  Filled 2015-11-12 (×2): qty 1

## 2015-11-12 NOTE — Progress Notes (Addendum)
*  PRELIMINARY RESULTS* Vascular Ultrasound Duplex Dialysis Access (AVF, AGV) of the left radiocephalic fistula has been completed.   Study from 09/22/15 is available for comparison.  Note: the patient became nauseated during the exam and requested he return to his room. Therefore, all images were not obtained.  The anastamosis has a diameter of 21mm.   The radial artery distal to the anastamosis has a velocity of 85/38.  The outflow vein 2cm from anastamosis has a depth of 4.34mm and a diameter of 2.5mm.  There is a branch in the posterior distal forearm that measures 1.85mm in diameter and 1.69mm depth.  The outflow vein at the mid forearm measures 3.44mm in depth and 5.7mm diameter.  There is what appears to be chronic thrombus in the outflow vein at the proximal forearm level. The vein is compressible at this level.  There is a branch in the antecubital fossa measuring 5.73mm in diameter and 7.67mm depth.  11/12/2015 2:24 PM Maudry Mayhew, RVT, RDCS, RDMS

## 2015-11-12 NOTE — Procedures (Signed)
Hemodialysis Catheter Insertion Procedure Note Bruce Cole [name] 07/30/1940  Procedure: Insertion of Hemodialysis Catheter Indications: Hemodialysis  Procedure Details Consent: Risks of procedure as well as the alternatives and risks of each were explained to the (patient/caregiver).  Consent for procedure obtained. Time Out: Verified patient identification, verified procedure, site/side was marked, verified correct patient position, special equipment/implants available, medications/allergies/relevent history reviewed, required imaging and test results available.  Performed  Maximum sterile technique was used including antiseptics, cap, gloves, gown, hand hygiene, mask and sheet. Skin prep: Chlorhexidine; local anesthetic administered A antimicrobial bonded/coated triple lumen catheter was placed in the right internal jugular vein using the Seldinger technique.  Evaluation Blood flow good Complications: No apparent complications Patient did tolerate procedure well. Chest X-ray ordered to verify placement.  CXR: pending.  Procedure performed under direct ultrasound guidance for real time vessel cannulation.      Montey Hora, [facility] Pgr: [phone]  or [phone] 11/12/2015, 5:49 PM

## 2015-11-12 NOTE — Progress Notes (Signed)
Pt having difficulty exhaling and is working harder to breathe.  VSS and PRN albuterol administered with no change.  Dr. Doyle Askew notified and orders received to obtain stat ABG and chest xray, will continue to closely monitor.

## 2015-11-12 NOTE — Progress Notes (Addendum)
Patient ID: Bruce Cole, male   DOB: May 11, 1941, 75 y.o.   MRN: [name]    PROGRESS NOTE    Bruce Cole  [name] DOB: Apr 22, 1941 DOA: 11/10/2015  PCP: Nance Pear., NP   Outpatient Specialists:   Brief Narrative:  75 y.o. male with rheumatoid arthritis, chronic atrial fibrillation on warfarin, chronic systolic CHF, and chronic kidney disease stage IV who presented to the ED at [facility] for evaluation of dyspnea and malaise 4 days duration. Patient has been followed closely by outpatient cardiologist and nephrologist and had AV fistula created in the left upper extremity on 08/26/2015, but has not yet required dialysis.  ED Course: Upon arrival to the [facility] ED, Chest x-ray demonstrated a right sided pleural effusion with an associated opacity in the right base. Transferred to [facility] for [facility].  Major events: 4/27 - more dyspneic in the afternoon 4 pm, ordered CXR as pt unable to tolerate CT, PCCM consulted, ABG pend, also call nephrology team again to consider HD sooner than expected  Assessment & Plan:   1. GI bleed with symptomatic anemia  - Suspect upper GI source given nausea and epigastric discomfort - Hold warfarin for now  - Protonix 40 mg IV BID  - 2 units pRBCs ordered for immediate transfusion on admission  - GI consulted for assistance, plan for EGD per GI once INR down  - CBC pending this AM  2. Chronic atrial fibrillation with supratherapeutic INR, bradycardia this AM - CHADS-VASc 3 (age, HTN, CHF) - INR trending down: 5.03 --> 3.8 - Hold warfarin until INR returns to goal and Hgb stable  - per cardiology, hold coreg until HR stabilizes  - cardiology's assistance is appreciated   3. AKI on CKD IV - SCr is 5.93 on admission, up from 3.3 on 10/27/15  - Nephrology is consulting and much appreciated  - AVF in LUE created on 08/26/15, pulsatile with faint bruit, subtle thrill - vascular surgery to look at the  AVF  4. Chronic systolic CHF/Volume overload  - remains volume overloaded and currently receiving 120 mg IV lasix BID with poor urine output - TTE (08/18/15) with EF 40-45%  - Wt is 265 on admission --> 275 lbs this AM - strict I/Os, daily wts - per nephrology   5. Hypertension, essential - At goal currently  - Continue with diuresis, Coreg   6. Rheumatoid arthritis  - Stable  - Continue home-dose Plaquenil   7. Right pleural effusion  - Likely secondary to vol o/l  - There is an associated opacity that could conceivably represent a PNA with parapneumonic effusion, but clinical findings do not correlate well  - May require thoracentesis if significant respiratory sxs persist following diuresis  - CT chest pending   8. Prolonged QTc - QTc 555 ms on admission  - Avoiding agents that would further prolong  - Monitor on telemetry   9. Obesity  - Body mass index is 34.15 kg/(m^2).  DVT prophylaxis: SCD's for now  Code Status: Full Family Communication: Patient at bedside  Disposition Plan: pending improvement in symptoms   Consultants:   Nephrology  Cardiology   GI  Procedures:   None   Antimicrobials:   None   Subjective: Denies chest pain or shortness of breath.   Objective: Filed Vitals:   11/11/15 2226 11/11/15 2228 11/12/15 0008 11/12/15 0412  BP: 127/70 127/70 135/80 132/66  Pulse:   57 55  Temp:   97.9 F (36.6 C)  98 F (36.7 C)  TempSrc:   Oral Oral  Resp:   22 20  Height:      Weight:      SpO2:   92% 97%    Intake/Output Summary (Last 24 hours) at 11/12/15 0639 Last data filed at 11/11/15 2231  Gross per 24 hour  Intake    962 ml  Output    450 ml  Net    512 ml   Filed Weights   11/10/15 1519 11/10/15 2016 11/11/15 0353  Weight: 122.471 kg (270 lb) 120.203 kg (265 lb) 123.923 kg (273 lb 3.2 oz)    Examination: General: Fatigued, NAD HEENT: normal Neck: supple. JVP jaw. Carotids 2+ bilat; no bruits. No  lymphadenopathy  Cor: PMI nondisplaced. Irregularly irregular rate & rhythm. Bradycardic. No murmurs. Lungs: Dull at bases, R > L Abdomen: soft, nontender, non distended. BS present  Extremities: LE edema + 2, LUE AVF  Data Reviewed: I have personally reviewed following labs and imaging studies  CBC:  Recent Labs Lab 11/10/15 1550 11/11/15 0821  WBC 5.9 5.9  NEUTROABS 4.7  --   HGB 6.5* 7.6*  HCT 20.8* 24.6*  MCV 88.1 86.3  PLT 239 [id number]   Basic Metabolic Panel:  Recent Labs Lab 11/10/15 1550 11/10/15 2144 11/11/15 0821 11/12/15 0507  NA 136  --  137  137 137  K 3.5  --  3.6  3.7 3.7  CL 93*  --  95*  95* 93*  CO2 25  --  29  29 30   GLUCOSE 108*  --  107*  104* 118*  BUN 128*  --  136*  137* 136*  CREATININE 5.93*  --  5.99*  6.10* 6.77*  CALCIUM 9.4  --  9.5  9.5 9.4  MG  --  2.6*  --   --   PHOS  --   --  5.5* 6.0*   Liver Function Tests:  Recent Labs Lab 11/10/15 1550 11/11/15 0821 11/12/15 0507  AST 29  --   --   ALT 14*  --   --   ALKPHOS 114  --   --   BILITOT 0.7  --   --   PROT 7.1  --   --   ALBUMIN 4.0 3.6 3.7   Coagulation Profile:  Recent Labs Lab 11/10/15 1550 11/11/15 0821 11/12/15 0507  INR 5.58* 5.03* 3.80*   Cardiac Enzymes:  Recent Labs Lab 11/10/15 1550  TROPONINI 0.10*   Urine analysis:    Component Value Date/Time   COLORURINE YELLOW 11/10/2015 1800   APPEARANCEUR CLEAR 11/10/2015 1800   LABSPEC 1.012 11/10/2015 1800   PHURINE 5.5 11/10/2015 1800   GLUCOSEU NEGATIVE 11/10/2015 1800   GLUCOSEU NEGATIVE 05/05/2015 1135   HGBUR NEGATIVE 11/10/2015 1800   HGBUR negative 09/21/2009 1008   BILIRUBINUR NEGATIVE 11/10/2015 1800   KETONESUR NEGATIVE 11/10/2015 1800   PROTEINUR NEGATIVE 11/10/2015 1800   UROBILINOGEN 0.2 05/05/2015 1135   NITRITE NEGATIVE 11/10/2015 1800   LEUKOCYTESUR NEGATIVE 11/10/2015 1800    Recent Results (from the past 240 hour(s))  MRSA PCR Screening     Status: None   Collection Time:  11/10/15  8:50 PM  Result Value Ref Range Status   MRSA by PCR NEGATIVE NEGATIVE Final    Comment:        The GeneXpert MRSA Assay (FDA approved for NASAL specimens only), is one component of a comprehensive MRSA colonization surveillance program. It is not intended to diagnose MRSA infection  nor to guide or monitor treatment for MRSA infections.      Radiology Studies: Dg Chest Port 1 View 11/10/2015  Right pleural effusion and basilar pulmonary opacity. Cardiomegaly is noted.  Scheduled Meds: . allopurinol  200 mg Oral Daily  . carvedilol  3.125 mg Oral Daily  . darbepoetin (ARANESP) injection - NON-DIALYSIS  100 mcg Subcutaneous Q Wed-1800  . fluticasone  2 spray Each Nare Daily  . furosemide  120 mg Intravenous BID  . hydrALAZINE  50 mg Oral BID  . hydroxychloroquine  200 mg Oral BID  . isosorbide mononitrate  30 mg Oral Daily  . multivitamin with minerals  1 tablet Oral Daily  . pantoprazole  40 mg Oral BID  . sodium chloride flush  3 mL Intravenous Q12H  . terazosin  1 mg Oral QHS   Continuous Infusions:    LOS: 2 days   Time spent: 20 minutes   Faye Ramsay, MD [facility] Pager [phone]  If 7PM-7AM, please contact night-coverage [email or web address] Password [facility] 11/12/2015, 6:39 AM

## 2015-11-12 NOTE — Progress Notes (Addendum)
[facility]    HPI: Creation of left radiocephalic AV fistula creation by Dr. Donnetta Hutching 08/26/2015.   He was admitted on 11/10/2015 for SOB and weakness.  His labs at admission are as follows: INR supratherapeutic 5.58. Elevated cr 5.93 and BUN of 128 . Dr. [name] wants [place] to decide if he can use the fistula for HD now or does he need a tunneled catheter placed.      Objective 128/74 63 98.1 F (36.7 C) (Oral) 18 98%  Intake/Output Summary (Last 24 hours) at 11/12/15 1152 Last data filed at 11/12/15 0900  Gross per 24 hour  Intake    962 ml  Output    350 ml  Net    612 ml    Palpable thrill and visible fistula distal 1/2  The proximal 1/2 has decreased thrill and visibility. His left UE is N/V/M intact without signs of steal.   Assessment/Planning: 10 weeks s/p left radio cephalic fistula His K 3.7, Cr 6.77, and BUN 136 I have ordered a duplex of the fistula.  I think they maybe able to stick the fistula, but I will defer the final say to Dr. Fae Pippin, [facility] 11/12/2015 11:52 AM --  Laboratory Lab Results:  Recent Labs  11/10/15 1550 11/11/15 0821  WBC 5.9 5.9  HGB 6.5* 7.6*  HCT 20.8* 24.6*  PLT 239 221   BMET  Recent Labs  11/11/15 0821 11/12/15 0507  NA 137  137 137  K 3.6  3.7 3.7  CL 95*  95* 93*  CO2 29  29 30   GLUCOSE 107*  104* 118*  BUN 136*  137* 136*  CREATININE 5.99*  6.10* 6.77*  CALCIUM 9.5  9.5 9.4    COAG Lab Results  Component Value Date   INR 3.80* 11/12/2015   INR 5.03* 11/11/2015   INR 5.58* 11/10/2015   No results found for: PTT    I have reviewed his fistula u/s.  My biggest concern is the depth of the fistula.  Also, there appear to be areas of narrowing near the anastamosis.  The next step will be a fistulogram to better evaluate his AVF.  This will be scheduled once his INR is corected.  On exam, his fistula is palpable in the distal forearm and difficult  to palpate more proximally.  This is potentially because of the depth, which would require elevation.  Further recs to be made after fistulogram.   Annamarie Major

## 2015-11-12 NOTE — Care Management Important Message (Signed)
Important Message  Patient Details  Name: Bruce Cole MRN: [name] Date of Birth: [birth year]   Medicare Important Message Given:  Yes    [name] May 11/12/2015, 1:49 PM

## 2015-11-12 NOTE — Progress Notes (Signed)
Daily Rounding Note  11/12/2015, 10:11 AM  LOS: 2 days   SUBJECTIVE:       Sleeping.  OBJECTIVE:         Vital signs in last 24 hours:    Temp:  [97.9 F (36.6 C)-98.2 F (36.8 C)] 98.1 F (36.7 C) (04/27 0749) Pulse Rate:  [55-63] 63 (04/27 0749) Resp:  [18-22] 18 (04/27 0749) BP: (127-135)/(66-80) 128/74 mmHg (04/27 0749) SpO2:  [92 %-98 %] 98 % (04/27 0749) Weight:  [125.147 kg (275 lb 14.4 oz)] 125.147 kg (275 lb 14.4 oz) (04/27 0500) Last BM Date: 11/10/15 Filed Weights   11/10/15 2016 11/11/15 0353 11/12/15 0500  Weight: 120.203 kg (265 lb) 123.923 kg (273 lb 3.2 oz) 125.147 kg (275 lb 14.4 oz)    Vital signs: BP 110/56 mmHg  Pulse 65  Temp(Src) 98 F (36.7 C) (Oral)  Resp 17  Ht 6\' 3"  (1.905 m)  Wt 275 lb 14.4 oz (125.147 kg)  BMI 34.49 kg/m2  SpO2 98% General: Well-developed, well-nourished, no acute distress HEENT: Sclerae are anicteric, conjunctiva pink. Oral mucosa intact Lungs: Clear Heart: Regular Abdomen: soft, obese, nontender, nondistended, no obvious ascites, no peritoneal signs, normal bowel sounds. No organomegaly. Extremities: No edema Psychiatric: alert and oriented x3. Cooperative Intake/Output from previous day: 04/26 0701 - 04/27 0700 In: 962 [P.O.:900; IV Piggyback:62] Out: 450 [Urine:450]  Intake/Output this shift: Total I/O In: 240 [P.O.:240] Out: -   Lab Results:  Recent Labs  11/10/15 1550 11/11/15 0821  WBC 5.9 5.9  HGB 6.5* 7.6*  HCT 20.8* 24.6*  PLT 239 221   BMET  Recent Labs  11/10/15 1550 11/11/15 0821 11/12/15 0507  NA 136 137  137 137  K 3.5 3.6  3.7 3.7  CL 93* 95*  95* 93*  CO2 25 29  29 30   GLUCOSE 108* 107*  104* 118*  BUN 128* 136*  137* 136*  CREATININE 5.93* 5.99*  6.10* 6.77*  CALCIUM 9.4 9.5  9.5 9.4   LFT  Recent Labs  11/10/15 1550 11/11/15 0821 11/12/15 0507  PROT 7.1  --   --   ALBUMIN 4.0 3.6 3.7  AST 29  --    --   ALT 14*  --   --   ALKPHOS 114  --   --   BILITOT 0.7  --   --   BILIDIR 0.2  --   --   IBILI 0.5  --   --    PT/INR  Recent Labs  11/11/15 0821 11/12/15 0507  LABPROT 45.2* 36.6*  INR 5.03* 3.80*   Hepatitis Panel No results for input(s): HEPBSAG, HCVAB, HEPAIGM, HEPBIGM in the last 72 hours.  Studies/Results: Dg Chest Port 1 View  11/10/2015  CLINICAL DATA:  Short of breath for 4 day EXAM: PORTABLE CHEST 1 VIEW COMPARISON:  08/19/2015 FINDINGS: The heart is moderately enlarged with a globular appearance. Moderate right pleural effusion and associated pulmonary opacity in the right mid and lower lung zones has developed. There is no pneumothorax. There is no pulmonary edema. There is no pneumothorax or vascular congestion. IMPRESSION: Right pleural effusion and basilar pulmonary opacity. Cardiomegaly is noted. Electronically Signed   By: Marybelle Killings M.D.   On: 11/10/2015 16:12    ASSESMENT:   * Normocytic anemia inpatient on chronic Coumadin with supratherapeutic INR. He is FOBT positive and has symptoms suggestive of GERD and/or ulcer disease consisting of dry heaves. Also reports dark  stools, possibly some of these melenic, over the last month. Also suspect anemia of chronic disease in patient with late stage chronic kidney disease. Iron, iron saturation low.  Ferritin 18.  Now on Aranesp,  S/p PRBC x 2 on 4/26.  Hgb up just 1 gram.     * History of colon polyps dating back a few decades. Last documented polyps (of unclear type) were in 2000. No recurrent polyps on the 2011 colonoscopy  * Long-standing atrial fibrillation, on Coumadin. INR supratherapeutic.  * Stage IV chronic kidney disease. BUN/creatinine increased significantly compared with 2 weeks ago.  * Elevation of single troponin I to 0.10. BNP elevated and clinically looks volume overloaded as well as having right pleural effusion on x-ray    PLAN   *  EGD tentatively set for tomorrow AM 0900, but  may need to postpone if INR is still 2 or more. Bruce Cole  11/12/2015, 10:11 AM Pager: [phone]  GI ATTENDING  Interval history data reviewed. Patient personally seen and examined. Agree with above progress note.  Docia Chuck. Geri Seminole., M.D. [facility] Division of Gastroenterology

## 2015-11-12 NOTE — Care Management Note (Addendum)
Case Management Note  Patient Details  Name: DARROLD TRIPPLETT MRN: [id number] Date of Birth: [birth year]  Subjective/Objective: Pt admitted for SOB, Malaise & Anemia. GI/ Renal is consulting. Unsure of disposition at this time.                    Action/Plan: CM will continue to monitor for disposition needs.    Expected Discharge Date:                  Expected Discharge Plan:  Home/Self Care  In-House Referral:  NA  Discharge planning Services  CM Consult  Post Acute Care Choice:    Choice offered to:     DME Arranged:    DME Agency:     HH Arranged:    HH Agency:     Status of Service:  In process, will continue to follow  Medicare Important Message Given:    Date Medicare IM Given:    Medicare IM give by:    Date Additional Medicare IM Given:    Additional Medicare Important Message give by:     If discussed at [place] of Stay Meetings, dates discussed:    Additional Comments: [id number] 11-16-15 Jacqlyn Krauss, RN,BSN [phone] Pt is a new Start to HD- Four treatments so far. The CLIP Process is underway. CM will continue to monitor for additional needs.   Bethena Roys, RN 11/12/2015, 12:40 PM

## 2015-11-12 NOTE — Progress Notes (Signed)
[place] for coumadin Indication: atrial fibrillation  Allergies  Allergen Reactions  . Ace Inhibitors Other (See Comments)    Worsening renal insufficiency    Labs:  Recent Labs  11/10/15 1550 11/11/15 0821 11/12/15 0507  HGB 6.5* 7.6*  --   HCT 20.8* 24.6*  --   PLT 239 221  --   LABPROT 49.0* 45.2* 36.6*  INR 5.58* 5.03* 3.80*  CREATININE 5.93* 5.99*  6.10* 6.77*  TROPONINI 0.10*  --   --     Estimated Creatinine Clearance: 14 mL/min (by C-G formula based on Cr of 6.77).   Assessment: 75 yo M on coumadin PTA for afib.   INR 5.58 on admit remains elevated 3.8 - and GI work up.  Will continue to hold warfarin  Goal of Therapy:  INR 2-3   Plan:  No coumadin tonight Daily INR  Bonnita Nasuti Pharm.D. CPP, BCPS Clinical Pharmacist [phone] 11/12/2015 10:57 AM

## 2015-11-12 NOTE — Consult Note (Signed)
Advanced Heart Failure Team Consult Note  Referring Physician: Dr Doyle Askew Primary Physician: Debbrah Alar, NP Primary Cardiologist:  Dr. Haroldine Laws   Reason for Consultation: A'C systolic HF  HPI:    Bruce Cole is a 75 y.o. 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 He is followed in the HF clinic. He has been on chronic coumadin therapy.  Admitted 1/30-2/04/2016 with volume overload. Creatinine was noted to have been gradually increasing over the past several months prior to admission, and was as high as 3.92 (day of discharge) With BUN in range of 90-100. He had left AVF placed 08/26/15. Dialysis not initiated during that admission. Discharge weight 281 lbs.  Echo 08/18/15 EF 40-45% with RV severely dilated with mild/mod reduced function.  PA systolic pressure 44 mmHg.   Seen in HF clinic 10/27/15. Was thought to be volume overloaded and metolazone added back 1-2x a week. Was told to take 5 mg metolazone x 2 days with extra K. Goal weight was to be 265 lbs.   He presented to [place] ED for DOE, abdominal pain, and malaise x 4 days. Pertinent labs on admission include BUN 128, CR 5.93, Hgb 6.5, INR 5.58, and BNP 285. UTI negative for gross infection.  Was transferred to [facility] for admission.   BUN elevation thought to be likely 2/2 to GI bleed and RBC digestion. Renal managing diuretics.   Has received 2 units of PRBC thus far.  + for melena.  CBC pending. Weight this am 275 lbs.  Feeling fatigued.  Dizzy with walking around room.  Some SOB as well.  Though does feel better than admit.  Still having darker stools.  Had been taking metolazone 1-2 times a week as directed, no more.  Weight had been going up.    Review of Systems: [y] = yes, [ ]  = no   General: Weight gain [y]; Weight loss [ ] ; Anorexia [ ] ; Fatigue [y]; Fever [ ] ; Chills [ ] ; Weakness [ ]   Cardiac: Chest pain/pressure [ ] ;  Resting SOB [ ] ; Exertional SOB [y]; Orthopnea [ ] ; Pedal Edema [ ] ; Palpitations [ ] ; Syncope [ ] ; Presyncope [ ] ; Paroxysmal nocturnal dyspnea[ ]   Pulmonary: Cough [ ] ; Wheezing[ ] ; Hemoptysis[ ] ; Sputum [ ] ; Snoring [ ]   GI: Vomiting[ ] ; Dysphagia[ ] ; Melena[y]; Hematochezia [ ] ; Heartburn[ ] ; Abdominal pain [y]; Constipation [ ] ; Diarrhea [ ] ; BRBPR [ ]   GU: Hematuria[ ] ; Dysuria [ ] ; Nocturia[ ]   Vascular: Pain in legs with walking [ ] ; Pain in feet with lying flat [ ] ; Non-healing sores [ ] ; Stroke [ ] ; TIA [ ] ; Slurred speech [ ] ;  Neuro: Headaches[ ] ; Vertigo[ ] ; Seizures[ ] ; Paresthesias[ ] ;Blurred vision [ ] ; Diplopia [ ] ; Vision changes [ ]   Ortho/Skin: Arthritis [y]; Joint pain [y]; Muscle pain [ ] ; Joint swelling [ ] ; Back Pain [ ] ; Rash [ ]   Psych: Depression[ ] ; Anxiety[ ]   Heme: Bleeding problems [ ] ; Clotting disorders [ ] ; Anemia [ ]   Endocrine: Diabetes [ ] ; Thyroid dysfunction[ ]   Home Medications Prior to Admission medications   Medication Sig Start Date End Date Taking? Authorizing Provider  albuterol (PROVENTIL) (2.5 MG/3ML) 0.083% nebulizer solution Take 2.5 mg by nebulization every 4 (four) hours as needed for shortness of breath. Reported on 09/25/2015   Yes Historical Provider, MD  allopurinol (ZYLOPRIM) 100 MG tablet TAKE 2 TABLETS BY MOUTH EVERY DAY 08/18/15  Yes Debbrah Alar, NP  carvedilol (COREG) 3.125 MG tablet Take 3.125 mg by mouth daily.   Yes Historical Provider, MD  colchicine 0.6 MG tablet Take 0.6 mg by mouth daily.   Yes Historical Provider, MD  fluticasone (FLONASE) 50 MCG/ACT nasal spray Place 2 sprays into both nostrils daily. 03/18/15  Yes Debbrah Alar, NP  furosemide (LASIX) 80 MG tablet TAKE 1 TABLET(80 MG) BY MOUTH THREE TIMES DAILY 09/29/15  Yes Debbrah Alar, NP  hydrALAZINE (APRESOLINE) 50 MG tablet Take 50 mg by mouth 2 (two) times daily.   Yes Historical Provider, MD  hydroxychloroquine (PLAQUENIL) 200 MG tablet Take 200 mg by  mouth 2 (two) times daily. 09/11/14  Yes Bo Merino, MD  isosorbide mononitrate (IMDUR) 30 MG 24 hr tablet TAKE 1 TABLET BY MOUTH EVERY DAY 08/17/15  Yes Larey Dresser, MD  metolazone (ZAROXOLYN) 5 MG tablet Take 1 tablet (5 mg total) by mouth 2 (two) times a week. AS NEEDED FOR WT GREATER THAN 265 10/27/15  Yes Jolaine Artist, MD  Multiple Vitamin (MULTIVITAMIN WITH MINERALS) TABS tablet Take 1 tablet by mouth daily.   Yes Historical Provider, MD  potassium chloride SA (K-DUR,KLOR-CON) 20 MEQ tablet Take 20 mEq by mouth 3 (three) times daily.   Yes Historical Provider, MD  terazosin (HYTRIN) 1 MG capsule TAKE 1 CAPSULE BY MOUTH EVERY NIGHT AT BEDTIME 10/16/15  Yes Debbrah Alar, NP  colchicine 0.6 MG tablet TAKE 2 TABLETS BY MOUTH AT THE START OF GOUT FLARE AND TAKE 1 TABLET 3 HOURS LATER; MAX 3 TABS PER GOUT ATTACK Patient not taking: Reported on 11/10/2015 10/16/15   Debbrah Alar, NP  warfarin (COUMADIN) 7.5 MG tablet TAKE 1 TABLET BY MOUTH DAILY AS DIRECTED BY COUMADIN CLINIC Patient not taking: Reported on 11/10/2015 09/10/15   Evans Lance, MD    Past Medical History: Past Medical History  Diagnosis Date  . Nephrolithiasis   . Arthritis     rheumatoid  . Colon polyp 2000  . Atrial fibrillation ([place])   . Unspecified essential hypertension   . Gout   . CHF (congestive heart failure) ([place])     Past Surgical History: Past Surgical History  Procedure Laterality Date  . Spine surgery      x 2  . Joint replacement      Total L-Hip replacement, Right Knee 10/20/09  . Cholecystectomy  1994  . Left and right heart catheterization with coronary angiogram N/A 02/22/2013    Procedure: LEFT AND RIGHT HEART CATHETERIZATION WITH CORONARY ANGIOGRAM;  Surgeon: Jolaine Artist, MD;  Location: [facility] CATH LAB;  Service: Cardiovascular;  Laterality: N/A;  . Av fistula placement Left 08/26/2015    Procedure: LEFT RADIOCEPHALIC FISTULA CREATION;  Surgeon: Rosetta Posner, MD;  Location: [facility]  OR;  Service: Vascular;  Laterality: Left;    Family History: Family History  Problem Relation Age of Onset  . Hypertension Mother   . Arthritis Mother     ?RA  . Hypertension Father     Social History: Social History   Social History  . Marital Status: Single    Spouse Name: N/A  . Number of Children: 3  . Years of Education: N/A   Occupational History  .     Social History Main Topics  . Smoking status: Never Smoker   . Smokeless tobacco: Never Used  . Alcohol Use: Yes     Comment: occasional  . Drug Use: No  . Sexual Activity: Not Asked  Other Topics Concern  . None   Social History Narrative   Former Clinical biochemist and [place]    Allergies:  Allergies  Allergen Reactions  . Ace Inhibitors Other (See Comments)    Worsening renal insufficiency    Objective:    Vital Signs:   Temp:  [97.9 F (36.6 C)-98.2 F (36.8 C)] 98.1 F (36.7 C) (04/27 0749) Pulse Rate:  [55-63] 63 (04/27 0749) Resp:  [18-22] 18 (04/27 0749) BP: (127-135)/(66-80) 128/74 mmHg (04/27 0749) SpO2:  [92 %-98 %] 98 % (04/27 0749) Weight:  [275 lb 14.4 oz (125.147 kg)] 275 lb 14.4 oz (125.147 kg) (04/27 0500) Last BM Date: 11/10/15  Weight change: Filed Weights   11/10/15 2016 11/11/15 0353 11/12/15 0500  Weight: 265 lb (120.203 kg) 273 lb 3.2 oz (123.923 kg) 275 lb 14.4 oz (125.147 kg)    Intake/Output:   Intake/Output Summary (Last 24 hours) at 11/12/15 0943 Last data filed at 11/12/15 0900  Gross per 24 hour  Intake    962 ml  Output    350 ml  Net    612 ml     Physical Exam: General:  Fatigued appearing. No resp difficulty  HEENT: normal Neck: supple. JVP jaw. Carotids 2+ bilat; no bruits. No lymphadenopathy or thyromegaly appreciated. Cor: PMI nondisplaced. Irregularly irregular rate & rhythm. Bradycardic. No rubs, gallops or murmurs. Lungs: Dull at bases, especially R side.  Abdomen: soft, nontender, nondistended. No hepatosplenomegaly. No bruits or  masses. Good bowel sounds. Extremities: no cyanosis, clubbing, rash, edema Neuro: alert & orientedx3, cranial nerves grossly intact. moves all 4 extremities w/o difficulty. Affect pleasant  Telemetry: Afib 40-50s   Labs: Basic Metabolic Panel:  Recent Labs Lab 11/10/15 1550 11/10/15 2144 11/11/15 0821 11/12/15 0507  NA 136  --  137  137 137  K 3.5  --  3.6  3.7 3.7  CL 93*  --  95*  95* 93*  CO2 25  --  29  29 30   GLUCOSE 108*  --  107*  104* 118*  BUN 128*  --  136*  137* 136*  CREATININE 5.93*  --  5.99*  6.10* 6.77*  CALCIUM 9.4  --  9.5  9.5 9.4  MG  --  2.6*  --   --   PHOS  --   --  5.5* 6.0*    Liver Function Tests:  Recent Labs Lab 11/10/15 1550 11/11/15 0821 11/12/15 0507  AST 29  --   --   ALT 14*  --   --   ALKPHOS 114  --   --   BILITOT 0.7  --   --   PROT 7.1  --   --   ALBUMIN 4.0 3.6 3.7   No results for input(s): LIPASE, AMYLASE in the last 168 hours. No results for input(s): AMMONIA in the last 168 hours.  CBC:  Recent Labs Lab 11/10/15 1550 11/11/15 0821  WBC 5.9 5.9  NEUTROABS 4.7  --   HGB 6.5* 7.6*  HCT 20.8* 24.6*  MCV 88.1 86.3  PLT 239 221    Cardiac Enzymes:  Recent Labs Lab 11/10/15 1550  TROPONINI 0.10*    BNP: BNP (last 3 results)  Recent Labs  08/16/15 2350 08/17/15 1535 11/10/15 1550  BNP 174.2* 204.1* 284.6*    ProBNP (last 3 results) No results for input(s): PROBNP in the last 8760 hours.   CBG: No results for input(s): GLUCAP in the last 168 hours.  Coagulation Studies:  Recent Labs  11/10/15 1550 11/11/15 0821 11/12/15 0507  LABPROT 49.0* 45.2* 36.6*  INR 5.58* 5.03* 3.80*    Other results: EKG: 11/11/15 Afib in 50s  Imaging: Dg Chest Port 1 View  11/10/2015  CLINICAL DATA:  Short of breath for 4 day EXAM: PORTABLE CHEST 1 VIEW COMPARISON:  08/19/2015 FINDINGS: The heart is moderately enlarged with a globular appearance. Moderate right pleural effusion and associated pulmonary  opacity in the right mid and lower lung zones has developed. There is no pneumothorax. There is no pulmonary edema. There is no pneumothorax or vascular congestion. IMPRESSION: Right pleural effusion and basilar pulmonary opacity. Cardiomegaly is noted. Electronically Signed   By: Marybelle Killings M.D.   On: 11/10/2015 16:12      Medications:     Current Medications: . allopurinol  200 mg Oral Daily  . carvedilol  3.125 mg Oral Daily  . darbepoetin (ARANESP) injection - NON-DIALYSIS  100 mcg Subcutaneous Q Wed-1800  . fluticasone  2 spray Each Nare Daily  . furosemide  120 mg Intravenous BID  . hydrALAZINE  50 mg Oral BID  . hydroxychloroquine  200 mg Oral BID  . isosorbide mononitrate  30 mg Oral Daily  . multivitamin with minerals  1 tablet Oral Daily  . pantoprazole  40 mg Oral BID  . sodium chloride flush  3 mL Intravenous Q12H  . terazosin  1 mg Oral QHS     Infusions:      Assessment   1. A/C systolic CHF 2. Chronic afib 3. 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 HTN, essential 5. Right pleural effusion 6. Prolonged QTc 7. Bradycardia 8. Obesity  Plan    He is volume overloaded and currently receiving 120 mg IV lasix BID with poor urine output.  Diuresis per Renal currently.  Suspect that he will likely need dialysis.  Renal to have VVS come and look at his fistula and likely place HD cath for dialysis this week.    He is bradycardic in the 40-50s on my exam. Will hold coreg for now.   Has had 2 transfusions. Now on Aranesp.   INR 3.80 this am.  GI following. Endoscopic procedures deferred while INR comes down.   Length of Stay: 2  Shirley Friar PA-C 11/12/2015, 9:43 AM  Advanced Heart Failure Team Pager [phone] (M-F; 7a - 4p)  Please contact [facility] for night-coverage after hours (4p -7a ) and weekends on [email or web address]  Patient seen and examined with Bruce Kilts, PA-C. We discussed all aspects of the encounter. I agree with the assessment and plan as stated  above.   He has marked volume overload and mild uremia in the setting of progressive renal failure complicated by GI bleed as well. He has not responded to lasix 120 IV. We discussed with Renal and he will get lasix 160 IV. Will likely need Perm-cath placement tomorrow to start HD (he is not happy about need for PC). Remains bradycardic. This is chronic for him. Agree with holding carvedilol. We will follow. No ACE/ARB due to A/CKD.   Bensimhon, Daniel,MD 11:06 AM

## 2015-11-12 NOTE — Progress Notes (Addendum)
I was called to evaluate Bruce Cole for increased work of breathing this afternoon (different from when he was seen earlier) by Dr.Magick-Myers. Plans are in place to start dialysis tomorrow.  BP 110/56 mmHg  Pulse 65  Temp(Src) 98 F (36.7 C) (Oral)  Resp 17  Ht 6\' 3"  (1.905 m)  Wt 125.147 kg (275 lb 14.4 oz)  BMI 34.49 kg/m2  SpO2 98% Lungs:  Bilateral fine rales with no wheeze but prolonged expiration. CVS: regular rhythm at 88bpm Left arm: undeveloped RCF (just placed on 09/15/2015)- short and small calibered.  Plan: 1. CCM consulted for temporary HD catheter by Dr.Myers (I saw them in the hallway and discussed plan) 2. HD with UF today 3. Await VVS assistance with conversion to tunneled catheter at later point and decision on whether AVF needs intervention to assist maturation.  Bruce Shiley MD [facility]. Office # [phone] Pager # [phone] 4:27 PM

## 2015-11-12 NOTE — Consult Note (Addendum)
Subjective:  Doing well this AM. No complaints at this time. Objective Vital signs in last 24 hours: Filed Vitals:   11/12/15 0008 11/12/15 0412 11/12/15 0500 11/12/15 0749  BP: 135/80 132/66  128/74  Pulse: 57 55  63  Temp: 97.9 F (36.6 C) 98 F (36.7 C)  98.1 F (36.7 C)  TempSrc: Oral Oral  Oral  Resp: 22 20  18   Height:      Weight:   275 lb 14.4 oz (125.147 kg)   SpO2: 92% 97%  98%   Weight change: 5 lb 14.4 oz (2.676 kg)  Intake/Output Summary (Last 24 hours) at 11/12/15 1133 Last data filed at 11/12/15 0900  Gross per 24 hour  Intake    962 ml  Output    350 ml  Net    612 ml    Assessment/ Plan: Pt is a 75 y.o. yo male who was admitted on 11/10/2015 with stage 4 CKD (creat 3.3 on 10/27/15), hx of left C[place]ino AVF 08/26/15, and multiple medical problems including afib on coumadin, CHF, who presented [facility] ED due to SOB, edema, CXR showed an increased right sided pleural effusion He previously had a thoracentesis with neg cytology on 08/19/15. In ED today labs showed acute anemia hgb 6.5, FOBT+, INR supratherapeutic 5.58. Elevated cr 5.93 and BUN of 128.  Assessment/Plan: 1. Stage 4 CKD with AKI: likely 2/2 hypotension and anemia. Cr. Up to 6.77 from 5.99. BUN elevated to 136 likely 2/2 GI bleed and RBC digestion. UOP 451ml. Continue diuresis increase Lasix 160mg  IV BID. Phos up to 6.0, will start binder. 2. Acute Blood Loss Anemia: GI on board. BUN elevated, likely from RBC digestion. Started Aranesp 4/26. Transfuse as needed (s/p 2u pRBCs) 3. Volume: overloaded. Continue to diurese, especially while needing transfusions. TED hose order placed. 4. Initiate HD tomorrow due to development of uremic symptoms and worsening azotemia and Scr.  Will need to have VVS evaluate patient for tunneled HD catheter and possible revision of a poorly maturing AVF of L arm.    Georges Lynch    Labs: Basic Metabolic Panel:  Recent Labs Lab 11/10/15 1550 11/11/15 0821 11/12/15 0507  NA  136 137  137 137  K 3.5 3.6  3.7 3.7  CL 93* 95*  95* 93*  CO2 25 29  29 30   GLUCOSE 108* 107*  104* 118*  BUN 128* 136*  137* 136*  CREATININE 5.93* 5.99*  6.10* 6.77*  CALCIUM 9.4 9.5  9.5 9.4  PHOS  --  5.5* 6.0*   Liver Function Tests:  Recent Labs Lab 11/10/15 1550 11/11/15 0821 11/12/15 0507  AST 29  --   --   ALT 14*  --   --   ALKPHOS 114  --   --   BILITOT 0.7  --   --   PROT 7.1  --   --   ALBUMIN 4.0 3.6 3.7   No results for input(s): LIPASE, AMYLASE in the last 168 hours. No results for input(s): AMMONIA in the last 168 hours. CBC:  Recent Labs Lab 11/10/15 1550 11/11/15 0821  WBC 5.9 5.9  NEUTROABS 4.7  --   HGB 6.5* 7.6*  HCT 20.8* 24.6*  MCV 88.1 86.3  PLT 239 221   Cardiac Enzymes:  Recent Labs Lab 11/10/15 1550  TROPONINI 0.10*   CBG: No results for input(s): GLUCAP in the last 168 hours.  Iron Studies:   Recent Labs  11/12/15 0507  IRON 14*  TIBC 392  FERRITIN 18*   Studies/Results: Dg Chest Port 1 View  11/10/2015  CLINICAL DATA:  Short of breath for 4 day EXAM: PORTABLE CHEST 1 VIEW COMPARISON:  08/19/2015 FINDINGS: The heart is moderately enlarged with a globular appearance. Moderate right pleural effusion and associated pulmonary opacity in the right mid and lower lung zones has developed. There is no pneumothorax. There is no pulmonary edema. There is no pneumothorax or vascular congestion. IMPRESSION: Right pleural effusion and basilar pulmonary opacity. Cardiomegaly is noted. Electronically Signed   By: Marybelle Killings M.D.   On: 11/10/2015 16:12   Medications: Infusions:    Scheduled Medications: . allopurinol  200 mg Oral Daily  . darbepoetin (ARANESP) injection - NON-DIALYSIS  100 mcg Subcutaneous Q Wed-1800  . fluticasone  2 spray Each Nare Daily  . furosemide  120 mg Intravenous BID  . hydrALAZINE  50 mg Oral BID  . hydroxychloroquine  200 mg Oral BID  . isosorbide mononitrate  30 mg Oral Daily  .  multivitamin with minerals  1 tablet Oral Daily  . pantoprazole  40 mg Oral BID  . sodium chloride flush  3 mL Intravenous Q12H  . terazosin  1 mg Oral QHS    have reviewed scheduled and prn medications.  Physical Exam: General -- sitting up in bed, pleasant and cooperative. NAD Chest -- good expansion. Lungs clear to auscultation. Cardiac -- RRR. No murmurs noted.  Abdomen -- soft, nontender. Ventral hernia present. Bowel sounds present. Extremeties - Asterixis noted, no tenderness or effusions noted. +2 edema. Fistula present in LUE, but not well matured. ROM good. Dorsalis pedis pulses present and symmetrical.    Elberta Leatherwood, MD,MS,  PGY2 11/12/2015 11:33 AM    LOS: 2 days   I have seen and examined this patient and agree with plan as outlined by Dr. Alease Frame.  Pt now with some uremic symptoms, nausea and has asterixis as well as marked volume overload.  Will plan for HD tomorrow after tunneled HD catheter placement (doubt we will be able to use AVF, however pending VVS evaluation). Governor Rooks Korena Nass,MD 11/12/2015 12:32 PM

## 2015-11-13 ENCOUNTER — Inpatient Hospital Stay (HOSPITAL_COMMUNITY): Payer: Medicare Other | Admitting: Certified Registered Nurse Anesthetist

## 2015-11-13 ENCOUNTER — Encounter (HOSPITAL_COMMUNITY): Payer: Self-pay | Admitting: Certified Registered Nurse Anesthetist

## 2015-11-13 ENCOUNTER — Encounter (HOSPITAL_COMMUNITY)
Admission: EM | Disposition: A | Discharge status: Skilled Nursing Facility | Payer: Self-pay | Source: Home / Self Care | Attending: Internal Medicine

## 2015-11-13 DIAGNOSIS — N183 Chronic kidney disease, stage 3 (moderate): Secondary | ICD-10-CM

## 2015-11-13 DIAGNOSIS — I130 Hypertensive heart and chronic kidney disease with heart failure and stage 1 through stage 4 chronic kidney disease, or unspecified chronic kidney disease: Secondary | ICD-10-CM

## 2015-11-13 DIAGNOSIS — K2970 Gastritis, unspecified, without bleeding: Secondary | ICD-10-CM | POA: Insufficient documentation

## 2015-11-13 DIAGNOSIS — J90 Pleural effusion, not elsewhere classified: Secondary | ICD-10-CM | POA: Insufficient documentation

## 2015-11-13 DIAGNOSIS — I509 Heart failure, unspecified: Secondary | ICD-10-CM

## 2015-11-13 DIAGNOSIS — I5033 Acute on chronic diastolic (congestive) heart failure: Secondary | ICD-10-CM

## 2015-11-13 DIAGNOSIS — K2990 Gastroduodenitis, unspecified, without bleeding: Secondary | ICD-10-CM

## 2015-11-13 HISTORY — PX: ESOPHAGOGASTRODUODENOSCOPY: SHX5428

## 2015-11-13 LAB — RENAL FUNCTION PANEL
ALBUMIN: 3.7 g/dL (ref 3.5–5.0)
ANION GAP: 15 (ref 5–15)
BUN: 116 mg/dL — ABNORMAL HIGH (ref 6–20)
CALCIUM: 9.3 mg/dL (ref 8.9–10.3)
CO2: 28 mmol/L (ref 22–32)
Chloride: 94 mmol/L — ABNORMAL LOW (ref 101–111)
Creatinine, Ser: 6.53 mg/dL — ABNORMAL HIGH (ref 0.61–1.24)
GFR calc non Af Amer: 8 mL/min — ABNORMAL LOW (ref >60)
GFR, EST AFRICAN AMERICAN: 9 mL/min — AB (ref >60)
Glucose, Bld: 107 mg/dL — ABNORMAL HIGH (ref 65–99)
PHOSPHORUS: 5.9 mg/dL — AB (ref 2.5–4.6)
POTASSIUM: 3.8 mmol/L (ref 3.5–5.1)
SODIUM: 137 mmol/L (ref 135–145)

## 2015-11-13 LAB — CBC
HCT: 25.5 % — ABNORMAL LOW (ref 39.0–52.0)
HEMOGLOBIN: 7.8 g/dL — AB (ref 13.0–17.0)
MCH: 27.7 pg (ref 26.0–34.0)
MCHC: 30.6 g/dL (ref 30.0–36.0)
MCV: 90.4 fL (ref 78.0–100.0)
Platelets: 167 10*3/uL (ref 150–400)
RBC: 2.82 MIL/uL — AB (ref 4.22–5.81)
RDW: 17.6 % — ABNORMAL HIGH (ref 11.5–15.5)
WBC: 5.9 10*3/uL (ref 4.0–10.5)

## 2015-11-13 LAB — HEPATITIS B SURFACE ANTIGEN: HEP B S AG: NEGATIVE

## 2015-11-13 LAB — PROTIME-INR
INR: 3.88 — ABNORMAL HIGH (ref 0.00–1.49)
Prothrombin Time: 37.2 seconds — ABNORMAL HIGH (ref 11.6–15.2)

## 2015-11-13 SURGERY — EGD (ESOPHAGOGASTRODUODENOSCOPY)
Anesthesia: Monitor Anesthesia Care

## 2015-11-13 MED ORDER — EPHEDRINE SULFATE 50 MG/ML IJ SOLN
INTRAMUSCULAR | Status: DC | PRN
  Administered 2015-11-13: 10 mg via INTRAVENOUS

## 2015-11-13 MED ORDER — PROPOFOL 500 MG/50ML IV EMUL
INTRAVENOUS | Status: DC | PRN
  Administered 2015-11-13: 50 ug/kg/min via INTRAVENOUS

## 2015-11-13 MED ORDER — PROPOFOL 10 MG/ML IV BOLUS
INTRAVENOUS | Status: DC | PRN
  Administered 2015-11-13: 20 mg via INTRAVENOUS

## 2015-11-13 MED ORDER — BUTAMBEN-TETRACAINE-BENZOCAINE 2-2-14 % EX AERO
INHALATION_SPRAY | CUTANEOUS | Status: DC | PRN
Start: 2015-11-13 — End: 2015-11-13
  Administered 2015-11-13: 1 via TOPICAL

## 2015-11-13 MED ORDER — LIDOCAINE HCL (CARDIAC) 20 MG/ML IV SOLN
INTRAVENOUS | Status: DC | PRN
  Administered 2015-11-13: 100 mg via INTRAVENOUS

## 2015-11-13 MED ORDER — SODIUM CHLORIDE 0.9 % IV SOLN
INTRAVENOUS | Status: DC
  Administered 2015-11-13: 09:00:00 via INTRAVENOUS
  Administered 2015-11-13: 500 mL via INTRAVENOUS

## 2015-11-13 MED ORDER — PHENYLEPHRINE HCL 10 MG/ML IJ SOLN
INTRAMUSCULAR | Status: DC | PRN
  Administered 2015-11-13 (×3): 40 ug via INTRAVENOUS

## 2015-11-13 NOTE — Transfer of Care (Signed)
Immediate Anesthesia Transfer of Care Note  Patient: Bruce Cole  Procedure(s) Performed: Procedure(s): ESOPHAGOGASTRODUODENOSCOPY (EGD) (N/A)  Patient Location: Endoscopy Unit  Anesthesia Type:MAC  Level of Consciousness: awake and alert   Airway & Oxygen Therapy: Patient Spontanous Breathing and Patient connected to nasal cannula oxygen  Post-op Assessment: Report given to RN and Post -op Vital signs reviewed and stable  Post vital signs: Reviewed and stable  Last Vitals:  Filed Vitals:   11/13/15 0832 11/13/15 0950  BP:  138/56  Pulse: 55 65  Temp: 36.7 C   Resp: 23 15    Last Pain:  Filed Vitals:   11/13/15 0950  PainSc: 0-No pain      Patients Stated Pain Goal: 0 ([id number])  Complications: No apparent anesthesia complications

## 2015-11-13 NOTE — Progress Notes (Signed)
  [facility] Progress Note   Fistulogram and tunneled dialysis catheter placement to be scheduled pending correction of INR. INR 3.88.  Hold coumadin.   Virgina Jock, PA-C [facility] Office: [phone] Pager: [phone] 11/13/2015 11:59 AM

## 2015-11-13 NOTE — Consult Note (Signed)
Subjective:  Experienced some SOB over night concerning for pulm edema which prompted HD to be done last night. Still feeling unwell this AM.  Objective Vital signs in last 24 hours: Filed Vitals:   11/13/15 0832 11/13/15 0950 11/13/15 0955 11/13/15 1246  BP:  138/56 145/56 135/71  Pulse: 55 65 61 57  Temp: 98 F (36.7 C) 97.7 F (36.5 C)  97.4 F (36.3 C)  TempSrc: Oral Oral  Oral  Resp: 23 15 21    Height:      Weight:      SpO2:  96% 97% 90%   Weight change: 8.9 oz (0.253 kg)  Intake/Output Summary (Last 24 hours) at 11/13/15 1259 Last data filed at 11/13/15 0947  Gross per 24 hour  Intake    420 ml  Output   2150 ml  Net  -1730 ml    Assessment/ Plan: Pt is a 75 y.o. yo male who was admitted on 11/10/2015 with stage 4 CKD (creat 3.3 on 10/27/15), hx of left Cimino AVF 08/26/15, and multiple medical problems including afib on coumadin, CHF, who presented to [facility] ED due to SOB, edema, CXR showed an increased right sided pleural effusion He previously had a thoracentesis with neg cytology on 08/19/15. In ED today labs showed acute anemia hgb 6.5, FOBT+, INR supratherapeutic 5.58. Elevated cr 5.93 and BUN of 128.  Assessment/Plan: 1. Stage 4 CKD with AKI: likely 2/2 hypotension and anemia. Cr. 6.53. BUN 116. Elevation likely 2/2 GI bleed and RBC digestion. Patient anuric w/ UOP 50cc. Patient unresponsive to Lasix 160mg  IV BID -- considering metalozone. Phos 5.9, started binder (not much change from 6.0), will continue. HD yesterday, will take patient to HD again today as well as Saturday.  - VVS looked at AVF yesterday >> obtaining fistulogram to further assess AVF. 2. Acute Blood Loss Anemia: GI on board. BUN elevated, likely from RBC digestion. Started Aranesp 4/26. Transfuse as needed (s/p 2u pRBCs). Iron studies down but transfusions likely helped this. 3. Volume: overloaded. Continue to diurese (although unresponsive), especially while needing transfusions. TED hose placed. 4.  Initiated HD 4/27: HD cath placed by CCM (4/27). HD again today, and tomorrow due to uremic symptoms, azotemia, and Cr. Will follow up on VVS plan for fistula   Labs: Basic Metabolic Panel:  Recent Labs Lab 11/11/15 0821 11/12/15 0507 11/13/15 0513  NA 137  137 137 137  K 3.6  3.7 3.7 3.8  CL 95*  95* 93* 94*  CO2 29  29 30 28   GLUCOSE 107*  104* 118* 107*  BUN 136*  137* 136* 116*  CREATININE 5.99*  6.10* 6.77* 6.53*  CALCIUM 9.5  9.5 9.4 9.3  PHOS 5.5* 6.0* 5.9*   Liver Function Tests:  Recent Labs Lab 11/10/15 1550 11/11/15 0821 11/12/15 0507 11/13/15 0513  AST 29  --   --   --   ALT 14*  --   --   --   ALKPHOS 114  --   --   --   BILITOT 0.7  --   --   --   PROT 7.1  --   --   --   ALBUMIN 4.0 3.6 3.7 3.7   No results for input(s): LIPASE, AMYLASE in the last 168 hours. No results for input(s): AMMONIA in the last 168 hours. CBC:  Recent Labs Lab 11/10/15 1550 11/11/15 0821 11/12/15 0507 11/13/15 1042  WBC 5.9 5.9 6.0 5.9  NEUTROABS 4.7  --   --   --  HGB 6.5* 7.6* 7.6* 7.8*  HCT 20.8* 24.6* 24.4* 25.5*  MCV 88.1 86.3 87.5 90.4  PLT 239 221 221 167   Cardiac Enzymes:  Recent Labs Lab 11/10/15 1550  TROPONINI 0.10*   CBG: No results for input(s): GLUCAP in the last 168 hours.  Iron Studies:   Recent Labs  11/12/15 0507  IRON 14*  TIBC 392  FERRITIN 18*   Studies/Results: Ct Chest Wo Contrast  11/12/2015  CLINICAL DATA:  Shortness of breath. Wheezing. Stage 4 chronic kidney disease. Pleural effusion. EXAM: CT CHEST WITHOUT CONTRAST TECHNIQUE: Multidetector CT imaging of the chest was performed following the standard protocol without IV contrast. COMPARISON:  11/10/2015 and 08/18/2015 FINDINGS: Mediastinum/Nodes: Prominent contour systemic venous structures. Cardiomegaly, especially involving the right atrium. Low-density along the medial margin of the right auricle as on image 64 series 2, I am uncertain whether this represents the  dentated margin or a filling defect such as thrombus. Descending aortic aneurysm, 4.7 cm in the posterior arch. Scattered small lymph nodes. Flattened and nearly occluded appearance of the right mainstem bronchus and bronchus intermedius. There is some loculation of the right pleural effusion along the right anterior mediastinal border. Lungs/Pleura: Large right pleural effusion filling about 70% of the right hemithorax, with associated atelectasis of much of the right lung. Suspected loculated components. There is mild atelectasis in the left lower lobe with airway thickening and near occlusion of left lower lobe segmental bronchi. Upper abdomen: Part of the right posterior thoracic cavity is excluded as it extends well below the left side. Trace perisplenic ascites. Musculoskeletal: Considerable thoracic spondylosis and degenerative disc disease. Bilateral gynecomastia. Diffuse subcutaneous edema. IMPRESSION: 1. Prominent systemic venous structures favoring elevated right heart pressures. Prominent right atrial enlargement and moderate enlargement of the rest of the heart. 2. I am unsure if the low-density posteriorly into the right in the right auricle represents the dentate margin of the right auricle or thrombus. Consider echocardiography with attention to this region. 3. Large right pleural effusion with extensive passive atelectasis. Loculated component of the pleural effusion anteriorly. 4. Prominent airway thickening and near collapse in the right mainstem bronchus, right bronchus intermedius, and in the left lower lobe bronchi. This raises suspicion for the possibility of bronchomalacia. 5. Diffuse subcutaneous edema and mild perisplenic ascites, likely due to diffuse third spacing of fluid. There is also bilateral gynecomastia. 6. Stable aneurysmal thoracic aorta, measuring up to 4.7 cm along the posterior arch. Electronically Signed   By: Van Clines M.D.   On: 11/12/2015 17:00   Dg Chest Port 1  View  11/12/2015  CLINICAL DATA:  Central line placement EXAM: PORTABLE CHEST 1 VIEW COMPARISON:  Multiple exams, including 11/10/2015 FINDINGS: Right IJ line tip is in the lower internal jugular vein, immediately above the brachiocephalic confluence. If SVC placement is desired consider advancing 3 cm. Stable large right pleural effusion with passive atelectasis. Stable cardiomegaly. IMPRESSION: 1. Right IJ line tip is projects in the lower right internal jugular vein, just above the brachiocephalic confluence. If SVC placement is desired, advanced 3 cm. 2. Stable large right pleural effusion and stable cardiomegaly. Electronically Signed   By: Van Clines M.D.   On: 11/12/2015 18:34   Medications: Infusions:    Scheduled Medications: . allopurinol  200 mg Oral Daily  . darbepoetin (ARANESP) injection - NON-DIALYSIS  100 mcg Subcutaneous Q Wed-1800  . fluticasone  2 spray Each Nare Daily  . furosemide  160 mg Intravenous BID  . hydrALAZINE  50 mg Oral BID  . hydroxychloroquine  200 mg Oral BID  . isosorbide mononitrate  30 mg Oral Daily  . multivitamin with minerals  1 tablet Oral Daily  . pantoprazole  40 mg Oral BID  . sevelamer carbonate  800 mg Oral TID WC  . sodium chloride flush  3 mL Intravenous Q12H  . terazosin  1 mg Oral QHS    have reviewed scheduled and prn medications.  Physical Exam: General -- laying down in bed, pleasant and cooperative. NAD Chest -- good expansion. Lungs clear to auscultation. Cardiac -- RRR. No murmurs noted.  Abdomen -- soft, nontender. Ventral hernia present. Bowel sounds present. Extremeties - Asterixis noted, no tenderness or effusions noted. +2 edema. Fistula present in LUE, but not well matured. ROM good. Dorsalis pedis pulses present and symmetrical.    Elberta Leatherwood, MD,MS,  PGY2 11/13/2015 12:59 PM    LOS: 3 days    I have seen and examined this patient and agree with plan as outlined by Dr. Alease Frame.  Plan for another session of HD  with UF today and will need fistulogram of AVF once INR has improved and possible revision.  Tunneled HD catheter also scheduled and will start process for outpatient HD placement.  He may benefit from repeat thoracentesis to assist with his orthopnea and further evaluation of pleural effusion R>L. Broadus John A Destry Dauber,MD 11/13/2015 1:21 PM

## 2015-11-13 NOTE — Anesthesia Preprocedure Evaluation (Addendum)
Anesthesia Evaluation  Patient identified by MRN, date of birth, ID band Patient awake    Reviewed: Allergy & Precautions, H&P , NPO status , Patient's Chart, lab work & pertinent test results  History of Anesthesia Complications Negative for: history of anesthetic complications  Airway Mallampati: III  TM Distance: >3 FB Neck ROM: full    Dental  (+) Poor Dentition   Pulmonary shortness of breath, sleep apnea ,  Patient with hypercarbic and hypoxic respiratory impairment   Pulmonary exam normal breath sounds clear to auscultation       Cardiovascular hypertension, +CHF  negative cardio ROS  + dysrhythmias Atrial Fibrillation  Rhythm:Irregular Rate:Bradycardia     Neuro/Psych negative neurological ROS     GI/Hepatic negative GI ROS, Neg liver ROS,   Endo/Other  diabetes  Renal/GU DialysisRenal disease     Musculoskeletal  (+) Arthritis ,   Abdominal   Peds  Hematology  (+) Blood dyscrasia, anemia ,   Anesthesia Other Findings Appears dyspnic at baselin  Reproductive/Obstetrics negative OB ROS                          Anesthesia Physical Anesthesia Plan  ASA: IV  Anesthesia Plan: MAC   Post-op Pain Management:    Induction: Intravenous  Airway Management Planned: Nasal Cannula  Additional Equipment:   Intra-op Plan:   Post-operative Plan: Possible Post-op intubation/ventilation  Informed Consent: I have reviewed the patients History and Physical, chart, labs and discussed the procedure including the risks, benefits and alternatives for the proposed anesthesia with the patient or authorized representative who has indicated his/her understanding and acceptance.   Dental Advisory Given  Plan Discussed with: Anesthesiologist, CRNA and Surgeon  Anesthesia Plan Comments: (Patient acutely ill with anemia, INR > 3, EF 45%, afib, obesity and on chronic coumadin therapy, now with  renal failure needing dialysis  Discussed risk of procedure with proceeduralist and patient, both wish to proceed. Possible need for intubation depending on EGD findings and patients multiple comorbidities)      Anesthesia Quick Evaluation

## 2015-11-13 NOTE — Progress Notes (Addendum)
Will plan for fistulogram next week Tuesday or Wednesday if coags corrected  Bruce Cole

## 2015-11-13 NOTE — Progress Notes (Signed)
Patient educated pm fall and safety plan. Pt refuses bed alarm. [facility] [employer]

## 2015-11-13 NOTE — H&P (View-Only) (Signed)
Daily Rounding Note  11/12/2015, 10:11 AM  LOS: 2 days   SUBJECTIVE:       Sleeping.  OBJECTIVE:         Vital signs in last 24 hours:    Temp:  [97.9 F (36.6 C)-98.2 F (36.8 C)] 98.1 F (36.7 C) (04/27 0749) Pulse Rate:  [55-63] 63 (04/27 0749) Resp:  [18-22] 18 (04/27 0749) BP: (127-135)/(66-80) 128/74 mmHg (04/27 0749) SpO2:  [92 %-98 %] 98 % (04/27 0749) Weight:  [125.147 kg (275 lb 14.4 oz)] 125.147 kg (275 lb 14.4 oz) (04/27 0500) Last BM Date: 11/10/15 Filed Weights   11/10/15 2016 11/11/15 0353 11/12/15 0500  Weight: 120.203 kg (265 lb) 123.923 kg (273 lb 3.2 oz) 125.147 kg (275 lb 14.4 oz)    Vital signs: BP 110/56 mmHg  Pulse 65  Temp(Src) 98 F (36.7 C) (Oral)  Resp 17  Ht 6\' 3"  (1.905 m)  Wt 275 lb 14.4 oz (125.147 kg)  BMI 34.49 kg/m2  SpO2 98% General: Well-developed, well-nourished, no acute distress HEENT: Sclerae are anicteric, conjunctiva pink. Oral mucosa intact Lungs: Clear Heart: Regular Abdomen: soft, obese, nontender, nondistended, no obvious ascites, no peritoneal signs, normal bowel sounds. No organomegaly. Extremities: No edema Psychiatric: alert and oriented x3. Cooperative Intake/Output from previous day: 04/26 0701 - 04/27 0700 In: 962 [P.O.:900; IV Piggyback:62] Out: 450 [Urine:450]  Intake/Output this shift: Total I/O In: 240 [P.O.:240] Out: -   Lab Results:  Recent Labs  11/10/15 1550 11/11/15 0821  WBC 5.9 5.9  HGB 6.5* 7.6*  HCT 20.8* 24.6*  PLT 239 221   BMET  Recent Labs  11/10/15 1550 11/11/15 0821 11/12/15 0507  NA 136 137  137 137  K 3.5 3.6  3.7 3.7  CL 93* 95*  95* 93*  CO2 25 29  29 30   GLUCOSE 108* 107*  104* 118*  BUN 128* 136*  137* 136*  CREATININE 5.93* 5.99*  6.10* 6.77*  CALCIUM 9.4 9.5  9.5 9.4   LFT  Recent Labs  11/10/15 1550 11/11/15 0821 11/12/15 0507  PROT 7.1  --   --   ALBUMIN 4.0 3.6 3.7  AST 29  --    --   ALT 14*  --   --   ALKPHOS 114  --   --   BILITOT 0.7  --   --   BILIDIR 0.2  --   --   IBILI 0.5  --   --    PT/INR  Recent Labs  11/11/15 0821 11/12/15 0507  LABPROT 45.2* 36.6*  INR 5.03* 3.80*   Hepatitis Panel No results for input(s): HEPBSAG, HCVAB, HEPAIGM, HEPBIGM in the last 72 hours.  Studies/Results: Dg Chest Port 1 View  11/10/2015  CLINICAL DATA:  Short of breath for 4 day EXAM: PORTABLE CHEST 1 VIEW COMPARISON:  08/19/2015 FINDINGS: The heart is moderately enlarged with a globular appearance. Moderate right pleural effusion and associated pulmonary opacity in the right mid and lower lung zones has developed. There is no pneumothorax. There is no pulmonary edema. There is no pneumothorax or vascular congestion. IMPRESSION: Right pleural effusion and basilar pulmonary opacity. Cardiomegaly is noted. Electronically Signed   By: Marybelle Killings M.D.   On: 11/10/2015 16:12    ASSESMENT:   * Normocytic anemia inpatient on chronic Coumadin with supratherapeutic INR. He is FOBT positive and has symptoms suggestive of GERD and/or ulcer disease consisting of dry heaves. Also reports dark  stools, possibly some of these melenic, over the last month. Also suspect anemia of chronic disease in patient with late stage chronic kidney disease. Iron, iron saturation low.  Ferritin 18.  Now on Aranesp,  S/p PRBC x 2 on 4/26.  Hgb up just 1 gram.     * History of colon polyps dating back a few decades. Last documented polyps (of unclear type) were in 2000. No recurrent polyps on the 2011 colonoscopy  * Long-standing atrial fibrillation, on Coumadin. INR supratherapeutic.  * Stage IV chronic kidney disease. BUN/creatinine increased significantly compared with 2 weeks ago.  * Elevation of single troponin I to 0.10. BNP elevated and clinically looks volume overloaded as well as having right pleural effusion on x-ray    PLAN   *  EGD tentatively set for tomorrow AM 0900, but  may need to postpone if INR is still 2 or more. Azucena Freed  11/12/2015, 10:11 AM Pager: [phone]  GI ATTENDING  Interval history data reviewed. Patient personally seen and examined. Agree with above progress note.  Docia Chuck. Geri Seminole., M.D. [facility] Division of Gastroenterology

## 2015-11-13 NOTE — Op Note (Signed)
[facility] Patient Name: Bruce Cole Procedure Date : 11/13/2015 MRN: [name] Attending MD: Docia Chuck. Henrene Pastor , MD Date of Birth: 1940-09-13 CSN: [id number] Age: 75 Admit Type: Inpatient Procedure:                Upper GI endoscopy Indications:              anemia , Heme positive stool Providers:                Docia Chuck. Henrene Pastor, MD, Cleda Daub, RN, Elspeth Cho, Technician Referring MD:             [facility] hospital[facility] Medicines:                Monitored Anesthesia Care Complications:            No immediate complications. Estimated Blood Loss:     Estimated blood loss: none. Procedure:                Pre-Anesthesia Assessment:                           - Prior to the procedure, a History and Physical                            was performed, and patient medications and                            allergies were reviewed. The patient's tolerance of                            previous anesthesia was also reviewed. The risks                            and benefits of the procedure and the sedation                            options and risks were discussed with the patient.                            All questions were answered, and informed consent                            was obtained. Prior Anticoagulants: The patient has                            taken Coumadin (warfarin), last dose was 2 days                            prior to procedure. ASA Grade Assessment: IV - A                            patient with severe systemic disease that is a  constant threat to life. After reviewing the risks                            and benefits, the patient was deemed in                            satisfactory condition to undergo the procedure.                           After obtaining informed consent, the endoscope was                            passed under direct vision. Throughout the   procedure, the patient's blood pressure, pulse, and                            oxygen saturations were monitored continuously. The                            [id number]) scope was introduced through the                            mouth, and advanced to the third part of duodenum.                            The upper GI endoscopy was accomplished without                            difficulty. The patient tolerated the procedure                            well. Scope In: Scope Out: Findings:      The esophagus was normal.      The stomach was normal, Except for mild gastritis.      The examined duodenum was normal.      The cardia and gastric fundus were normal on retroflexion. Impression:               1.Mild gastritis.May cause heme positive stool in                            face of over anticoagulation. Otherwise normal EGD                           2. Anemia of chronic disease Moderate Sedation:      none Recommendation:           1. Recommend continuing PPI for upper GI mucosal                            protection in a patient with multiple medical                            problems on chronic Coumadin therapy  2. Return to the care of primary team. No further                            GI workup planned. Will sign off. Procedure Code(s):        --- Professional ---                           [phone], Esophagogastroduodenoscopy, flexible,                            transoral; diagnostic, including collection of                            specimen(s) by brushing or washing, when performed                            (separate procedure) Diagnosis Code(s):        --- Professional ---                           D50.0, Iron deficiency anemia secondary to blood                            loss (chronic)                           R19.5, Other fecal abnormalities CPT copyright 2016 [employer]. All rights reserved. The codes documented in  this report are preliminary and upon coder review may  be revised to meet current compliance requirements. Docia Chuck. Henrene Pastor, MD 11/13/2015 9:50:52 AM This report has been signed electronically. Number of Addenda: 0

## 2015-11-13 NOTE — Consult Note (Signed)
Name: Bruce Cole MRN: [id number] DOB: 1941/04/11    ADMISSION DATE:  11/10/2015 CONSULTATION DATE:  4/28  REFERRING MD :  [facility]  CHIEF COMPLAINT:  Rt pleural effusion  BRIEF PATIENT DESCRIPTION: Bruce Cole  SIGNIFICANT EVENTS    STUDIES:     HISTORY OF PRESENT ILLNESS:   75 yo MO(272 lbs) with stage 4 kidney disease with left AVF placed 2/17 who presents with increased SOB/Lethargy/riding creatine/volume overloaded. He is followed by heart failure clinic and has documented OSA(which he denies). Long standing Afib for which he is on coumadin(inr 3.88). He has a recurrent right pleural effusion that was tapped in 2/17 for 1500 cc with negative cytology. PCCM asked to place HD cath(done 4/27) and now consult for right effusion. Suspect this will improve once negative fluid balance is obtained with new HD catheter. Renal and and cards are following. We would need to correct inr to perform thoracentesis. Consider follow as negative fluid balance is obtained. PCCM will follow along and be available PRN.  PAST MEDICAL HISTORY :   has a past medical history of Nephrolithiasis; Arthritis; Colon polyp (2000); Atrial fibrillation ([name]); Unspecified essential hypertension; Gout; and CHF (congestive heart failure) ([place]).  has past surgical history that includes Spine surgery; Joint replacement; Cholecystectomy (1994); left and right heart catheterization with coronary angiogram (N/A, 02/22/2013); and AV fistula placement (Left, 08/26/2015). Prior to Admission medications   Medication Sig Start Date End Date Taking? Authorizing Provider  albuterol (PROVENTIL) (2.5 MG/3ML) 0.083% nebulizer solution Take 2.5 mg by nebulization every 4 (four) hours as needed for shortness of breath. Reported on 09/25/2015   Yes Historical Provider, MD  allopurinol (ZYLOPRIM) 100 MG tablet TAKE 2 TABLETS BY MOUTH EVERY DAY 08/18/15  Yes Debbrah Alar, NP  carvedilol (COREG) 3.125 MG tablet Take 3.125 mg by mouth daily.    Yes Historical Provider, MD  colchicine 0.6 MG tablet Take 0.6 mg by mouth daily.   Yes Historical Provider, MD  fluticasone (FLONASE) 50 MCG/ACT nasal spray Place 2 sprays into both nostrils daily. 03/18/15  Yes Debbrah Alar, NP  furosemide (LASIX) 80 MG tablet TAKE 1 TABLET(80 MG) BY MOUTH THREE TIMES DAILY 09/29/15  Yes Debbrah Alar, NP  hydrALAZINE (APRESOLINE) 50 MG tablet Take 50 mg by mouth 2 (two) times daily.   Yes Historical Provider, MD  hydroxychloroquine (PLAQUENIL) 200 MG tablet Take 200 mg by mouth 2 (two) times daily. 09/11/14  Yes Bo Merino, MD  isosorbide mononitrate (IMDUR) 30 MG 24 hr tablet TAKE 1 TABLET BY MOUTH EVERY DAY 08/17/15  Yes Larey Dresser, MD  metolazone (ZAROXOLYN) 5 MG tablet Take 1 tablet (5 mg total) by mouth 2 (two) times a week. AS NEEDED FOR WT GREATER THAN 265 10/27/15  Yes Jolaine Artist, MD  Multiple Vitamin (MULTIVITAMIN WITH MINERALS) TABS tablet Take 1 tablet by mouth daily.   Yes Historical Provider, MD  potassium chloride SA (K-DUR,KLOR-CON) 20 MEQ tablet Take 20 mEq by mouth 3 (three) times daily.   Yes Historical Provider, MD  terazosin (HYTRIN) 1 MG capsule TAKE 1 CAPSULE BY MOUTH EVERY NIGHT AT BEDTIME 10/16/15  Yes Debbrah Alar, NP  colchicine 0.6 MG tablet TAKE 2 TABLETS BY MOUTH AT THE START OF GOUT FLARE AND TAKE 1 TABLET 3 HOURS LATER; MAX 3 TABS PER GOUT ATTACK Patient not taking: Reported on 11/10/2015 10/16/15   Debbrah Alar, NP  warfarin (COUMADIN) 7.5 MG tablet TAKE 1 TABLET BY MOUTH DAILY AS DIRECTED BY COUMADIN CLINIC Patient not  taking: Reported on 11/10/2015 09/10/15   Evans Lance, MD   Allergies  Allergen Reactions  . Ace Inhibitors Other (See Comments)    Worsening renal insufficiency    FAMILY HISTORY:  family history includes Arthritis in his mother; Hypertension in his father and mother. SOCIAL HISTORY:  reports that he has never smoked. He has never used smokeless tobacco. He reports that  he drinks alcohol. He reports that he does not use illicit drugs.  REVIEW OF SYSTEMS:   10 point review of system taken, please see HPI for positives and negatives. SUBJECTIVE:   VITAL SIGNS: Temp:  [97.5 F (36.4 C)-98.9 F (37.2 C)] 97.7 F (36.5 C) (04/28 0950) Pulse Rate:  [47-66] 61 (04/28 0955) Resp:  [15-30] 21 (04/28 0955) BP: (97-145)/(49-77) 145/56 mmHg (04/28 0955) SpO2:  [95 %-100 %] 97 % (04/28 0955) Weight:  [272 lb 0.8 oz (123.4 kg)-276 lb 7.3 oz (125.4 kg)] 272 lb 8 oz (123.605 kg) (04/28 0058)  PHYSICAL EXAMINATION: General:  MO lethargic AAM Neuro:  Lethargic but follows ciommands HEENT:  Rt I J HD cath Cardiovascular: HSIR IR Lungs:  Decreased bs rt >Left Abdomen:  obsese + bs Musculoskeletal: intact , old rt knee  median scar Skin:  Dry   Recent Labs Lab 11/11/15 0821 11/12/15 0507 11/13/15 0513  NA 137  137 137 137  K 3.6  3.7 3.7 3.8  CL 95*  95* 93* 94*  CO2 29  29 30 28   BUN 136*  137* 136* 116*  CREATININE 5.99*  6.10* 6.77* 6.53*  GLUCOSE 107*  104* 118* 107*    Recent Labs Lab 11/11/15 0821 11/12/15 0507 11/13/15 1042  HGB 7.6* 7.6* 7.8*  HCT 24.6* 24.4* 25.5*  WBC 5.9 6.0 5.9  PLT 221 221 167   Ct Chest Wo Contrast  11/12/2015  CLINICAL DATA:  Shortness of breath. Wheezing. Stage 4 chronic kidney disease. Pleural effusion. EXAM: CT CHEST WITHOUT CONTRAST TECHNIQUE: Multidetector CT imaging of the chest was performed following the standard protocol without IV contrast. COMPARISON:  11/10/2015 and 08/18/2015 FINDINGS: Mediastinum/Nodes: Prominent contour systemic venous structures. Cardiomegaly, especially involving the right atrium. Low-density along the medial margin of the right auricle as on image 64 series 2, I am uncertain whether this represents the dentated margin or a filling defect such as thrombus. Descending aortic aneurysm, 4.7 cm in the posterior arch. Scattered small lymph nodes. Flattened and nearly occluded  appearance of the right mainstem bronchus and bronchus intermedius. There is some loculation of the right pleural effusion along the right anterior mediastinal border. Lungs/Pleura: Large right pleural effusion filling about 70% of the right hemithorax, with associated atelectasis of much of the right lung. Suspected loculated components. There is mild atelectasis in the left lower lobe with airway thickening and near occlusion of left lower lobe segmental bronchi. Upper abdomen: Part of the right posterior thoracic cavity is excluded as it extends well below the left side. Trace perisplenic ascites. Musculoskeletal: Considerable thoracic spondylosis and degenerative disc disease. Bilateral gynecomastia. Diffuse subcutaneous edema. IMPRESSION: 1. Prominent systemic venous structures favoring elevated right heart pressures. Prominent right atrial enlargement and moderate enlargement of the rest of the heart. 2. I am unsure if the low-density posteriorly into the right in the right auricle represents the dentate margin of the right auricle or thrombus. Consider echocardiography with attention to this region. 3. Large right pleural effusion with extensive passive atelectasis. Loculated component of the pleural effusion anteriorly. 4. Prominent airway thickening and near  collapse in the right mainstem bronchus, right bronchus intermedius, and in the left lower lobe bronchi. This raises suspicion for the possibility of bronchomalacia. 5. Diffuse subcutaneous edema and mild perisplenic ascites, likely due to diffuse third spacing of fluid. There is also bilateral gynecomastia. 6. Stable aneurysmal thoracic aorta, measuring up to 4.7 cm along the posterior arch. Electronically Signed   By: Van Clines M.D.   On: 11/12/2015 17:00   Dg Chest Port 1 View  11/12/2015  CLINICAL DATA:  Central line placement EXAM: PORTABLE CHEST 1 VIEW COMPARISON:  Multiple exams, including 11/10/2015 FINDINGS: Right IJ line tip is in  the lower internal jugular vein, immediately above the brachiocephalic confluence. If SVC placement is desired consider advancing 3 cm. Stable large right pleural effusion with passive atelectasis. Stable cardiomegaly. IMPRESSION: 1. Right IJ line tip is projects in the lower right internal jugular vein, just above the brachiocephalic confluence. If SVC placement is desired, advanced 3 cm. 2. Stable large right pleural effusion and stable cardiomegaly. Electronically Signed   By: Van Clines M.D.   On: 11/12/2015 18:34    ASSESSMENT:  Principal Problem:    Pleural effusion, right , recurrent in setting of volume overload   Symptomatic anemia   Essential hypertension   Rheumatoid arthritis ([place])   Diabetes mellitus type 2, controlled, with complications ([place])   CKD (chronic kidney disease), stage IV (HCC)   Chronic combined systolic and diastolic heart failure (HCC)   Acute renal failure superimposed on stage 3 chronic kidney disease   Chronic atrial fibrillation (HCC)   AKI (acute kidney injury) ([place])   Cardiorenal disease   GI bleed   Supratherapeutic INR   Heme positive stool   Coagulopathy (HCC)   Gastritis and gastroduodenitis    Discussion: 75 yo MO(272 lbs) with stage 4 kidney disease with left AVF placed 2/17 who presents with increased SOB/Lethargy/riding creatine/volume overloaded. He is followed by heart failure clinic and has documented OSA(which he denies). Long standing Afib for which he is on coumadin(inr 3.88). He has a recurrent right pleural effusion that was tapped in 2/17 for 1500 cc with negative cytology. PCCM asked to place HD cath(done 4/27) and now consult for right effusion. Suspect this will improve once negative fluid balance is obtained with new HD catheter. Renal and and cards are following. We would need to correct inr to perform thoracentesis. Consider follow as negative fluid balance is obtained. PCCM will follow along and be available PRN.       PLAN: Negative fluid balance by Renal and Cards Follow up cxr for effusion monitoring Consider pulmonary follow up for OSA. [place] evaluation of effusion.  Richardson Landry Minor ACNP Maryanna Shape PCCM Pager [phone] till 3 pm If no answer page [phone] 11/13/2015, 12:36 PM   STAFF NOTE: I, Merrie Roof, MD FACP have personally reviewed patient's available data, including medical history, events of note, physical examination and test results as part of my evaluation. I have discussed with resident/NP and other care providers such as pharmacist, RN and RRT. In addition, I personally evaluated patient and elicited key findings of: mild increase RR, reduced BS rt, , crackles, HD catheter in place, I also did [place] chest which shows large rt effusion with some lung flap as well, INR is almost 4, HD is due soon, would proceed with HD and assess resp status after, his INR precludes thora at this stage, if after neg balance on hd still with SOB or worsening status of any  Kind  would give ffp and do thora then, will re evaluate Monday unless he declines this weekend, it is likely even with HD that we will have to do thora and drain this rt effusion, would want INR to drift down for safety  Lavon Paganini. Titus Mould, MD, [place] Pgr: [facility] 11/13/2015 8:35 PM

## 2015-11-13 NOTE — Consult Note (Signed)
   [facility] Inpatient Consult   11/13/2015  Bruce Cole March 27, 1941 [id number]   Patient is currently active with [facility] for chronic disease management services.  Patient has been engaged by a [employer] and CSW.  Met with the patient at bedside.  Patient was resting in bed.  Verbalized ongoing desire for [facility] follow up.  Admitted with anemia, HX HF.  Active consent on file. Patient will receive a post discharge transition of care call and will be evaluated for monthly home visits for assessments and disease process education. Will follow for progress and disposition.  Patient pretty tired on encounter.   Made Inpatient Case Manager aware that [facility] following. Of note, [facility] Care Management services does not replace or interfere with any services that are needed or arranged by inpatient case management or social work.  For additional questions or referrals please contact:  Natividad Brood, RN BSN [facility] Liaison  [phone] business mobile phone Toll free office [phone]

## 2015-11-13 NOTE — Consult Note (Signed)
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 He is followed in the HF clinic. He has been on chronic coumadin therapy.  Admitted with worsening renal function, volume overload and GIB.   Trialysis catheter placed last night (AV fistula not sufficient at this point). Creatinine 6.5 -> 6.8. 2L out with high-dose IV lasix but weight unchanged.   Feels ok. Denies dyspnea.    Objective:    Vital Signs:   Temp:  [97.5 F (36.4 C)-98.9 F (37.2 C)] 98 F (36.7 C) (04/28 0748) Pulse Rate:  [47-66] 56 (04/28 0748) Resp:  [15-30] 19 (04/28 0341) BP: (97-133)/(49-77) 133/70 mmHg (04/28 0748) SpO2:  [95 %-100 %] 95 % (04/28 0748) Weight:  [123.4 kg (272 lb 0.8 oz)-125.4 kg (276 lb 7.3 oz)] 123.605 kg (272 lb 8 oz) (04/28 0058) Last BM Date: 11/10/15  Weight change: Filed Weights   11/12/15 1910 11/12/15 2140 11/13/15 0058  Weight: 125.4 kg (276 lb 7.3 oz) 123.4 kg (272 lb 0.8 oz) 123.605 kg (272 lb 8 oz)    Intake/Output:   Intake/Output Summary (Last 24 hours) at 11/13/15 0815 Last data filed at 11/13/15 0814  Gross per 24 hour  Intake    650 ml  Output   2150 ml  Net  -1500 ml     Physical Exam: General:  Lying in bed. No resp difficulty  HEENT: normal Neck: supple. JVP jaw. +trialysis cath Carotids 2+ bilat; no bruits. No lymphadenopathy or thyromegaly appreciated. Cor: PMI nondisplaced. Irregularly irregular rate & rhythm. Bradycardic. No rubs, gallops or murmurs. Lungs: Dull at bases otherwise clear Abdomen: soft, nontender, nondistended. No hepatosplenomegaly. No bruits or masses. Good bowel sounds. Extremities: no cyanosis, clubbing, rash, 2-3+ edema Neuro: alert & orientedx3, cranial nerves grossly intact. moves all 4 extremities w/o difficulty. Affect pleasant  Telemetry: Afib 40-50s    Labs: Basic Metabolic Panel:  Recent Labs Lab 11/10/15 1550 11/10/15 2144 11/11/15 0821 11/12/15 0507 11/13/15 0513  NA 136  --  137  137 137 137  K 3.5  --  3.6  3.7 3.7 3.8  CL 93*  --  95*  95* 93* 94*  CO2 25  --  29  29 30 28   GLUCOSE 108*  --  107*  104* 118* 107*  BUN 128*  --  136*  137* 136* 116*  CREATININE 5.93*  --  5.99*  6.10* 6.77* 6.53*  CALCIUM 9.4  --  9.5  9.5 9.4 9.3  MG  --  2.6*  --   --   --   PHOS  --   --  5.5* 6.0* 5.9*    Liver Function Tests:  Recent Labs Lab 11/10/15 1550 11/11/15 0821 11/12/15 0507 11/13/15 0513  AST 29  --   --   --   ALT 14*  --   --   --   ALKPHOS 114  --   --   --   BILITOT 0.7  --   --   --   PROT 7.1  --   --   --   ALBUMIN 4.0 3.6 3.7 3.7   No results for input(s): LIPASE, AMYLASE in the last 168 hours. No results for input(s): AMMONIA in the last 168 hours.  CBC:  Recent Labs Lab 11/10/15 1550 11/11/15 0821 11/12/15 0507  WBC  5.9 5.9 6.0  NEUTROABS 4.7  --   --   HGB 6.5* 7.6* 7.6*  HCT 20.8* 24.6* 24.4*  MCV 88.1 86.3 87.5  PLT 239 221 221    Cardiac Enzymes:  Recent Labs Lab 11/10/15 1550  TROPONINI 0.10*    BNP: BNP (last 3 results)  Recent Labs  08/16/15 2350 08/17/15 1535 11/10/15 1550  BNP 174.2* 204.1* 284.6*    ProBNP (last 3 results) No results for input(s): PROBNP in the last 8760 hours.   CBG: No results for input(s): GLUCAP in the last 168 hours.  Coagulation Studies:  Recent Labs  11/10/15 1550 11/11/15 0821 11/12/15 0507 11/13/15 0513  LABPROT 49.0* 45.2* 36.6* 37.2*  INR 5.58* 5.03* 3.80* 3.88*    Other results: EKG: 11/11/15 Afib in 50s  Imaging: Ct Chest Wo Contrast  11/12/2015  CLINICAL DATA:  Shortness of breath. Wheezing. Stage 4 chronic kidney disease. Pleural effusion. EXAM: CT CHEST WITHOUT CONTRAST TECHNIQUE: Multidetector CT imaging of the chest was performed following the standard protocol without IV contrast. COMPARISON:   11/10/2015 and 08/18/2015 FINDINGS: Mediastinum/Nodes: Prominent contour systemic venous structures. Cardiomegaly, especially involving the right atrium. Low-density along the medial margin of the right auricle as on image 64 series 2, I am uncertain whether this represents the dentated margin or a filling defect such as thrombus. Descending aortic aneurysm, 4.7 cm in the posterior arch. Scattered small lymph nodes. Flattened and nearly occluded appearance of the right mainstem bronchus and bronchus intermedius. There is some loculation of the right pleural effusion along the right anterior mediastinal border. Lungs/Pleura: Large right pleural effusion filling about 70% of the right hemithorax, with associated atelectasis of much of the right lung. Suspected loculated components. There is mild atelectasis in the left lower lobe with airway thickening and near occlusion of left lower lobe segmental bronchi. Upper abdomen: Part of the right posterior thoracic cavity is excluded as it extends well below the left side. Trace perisplenic ascites. Musculoskeletal: Considerable thoracic spondylosis and degenerative disc disease. Bilateral gynecomastia. Diffuse subcutaneous edema. IMPRESSION: 1. Prominent systemic venous structures favoring elevated right heart pressures. Prominent right atrial enlargement and moderate enlargement of the rest of the heart. 2. I am unsure if the low-density posteriorly into the right in the right auricle represents the dentate margin of the right auricle or thrombus. Consider echocardiography with attention to this region. 3. Large right pleural effusion with extensive passive atelectasis. Loculated component of the pleural effusion anteriorly. 4. Prominent airway thickening and near collapse in the right mainstem bronchus, right bronchus intermedius, and in the left lower lobe bronchi. This raises suspicion for the possibility of bronchomalacia. 5. Diffuse subcutaneous edema and mild  perisplenic ascites, likely due to diffuse third spacing of fluid. There is also bilateral gynecomastia. 6. Stable aneurysmal thoracic aorta, measuring up to 4.7 cm along the posterior arch. Electronically Signed   By: Van Clines M.D.   On: 11/12/2015 17:00   Dg Chest Port 1 View  11/12/2015  CLINICAL DATA:  Central line placement EXAM: PORTABLE CHEST 1 VIEW COMPARISON:  Multiple exams, including 11/10/2015 FINDINGS: Right IJ line tip is in the lower internal jugular vein, immediately above the brachiocephalic confluence. If SVC placement is desired consider advancing 3 cm. Stable large right pleural effusion with passive atelectasis. Stable cardiomegaly. IMPRESSION: 1. Right IJ line tip is projects in the lower right internal jugular vein, just above the brachiocephalic confluence. If SVC placement is desired, advanced 3 cm. 2. Stable large right pleural effusion  and stable cardiomegaly. Electronically Signed   By: Van Clines M.D.   On: 11/12/2015 18:34     Medications:     Current Medications: . allopurinol  200 mg Oral Daily  . darbepoetin (ARANESP) injection - NON-DIALYSIS  100 mcg Subcutaneous Q Wed-1800  . fluticasone  2 spray Each Nare Daily  . furosemide  160 mg Intravenous BID  . hydrALAZINE  50 mg Oral BID  . hydroxychloroquine  200 mg Oral BID  . isosorbide mononitrate  30 mg Oral Daily  . multivitamin with minerals  1 tablet Oral Daily  . pantoprazole  40 mg Oral BID  . sevelamer carbonate  800 mg Oral TID WC  . sodium chloride flush  3 mL Intravenous Q12H  . terazosin  1 mg Oral QHS    Infusions: . sodium chloride       Assessment   1. A/C systolic CHF 2. Chronic afib 3. 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 HTN, essential 5. Right pleural effusion 6. Prolonged QTc 7. Bradycardia 8. Obesity 9, GI bleed with symptomatic anemia  Plan    Moderate response to high-dose IV lasix but weight unchanged. Likely to start HD. Today. Remains in slow AF (chronic). B-blocker  stopped. Off warfarin due to GIB.   He will likely start HD today.  We will follow at a distance.   Please call over the weekend with questions.   Length of Stay: 3  Tamaj Jurgens MD  11/13/2015, 8:15 AM  Advanced Heart Failure Team Pager [phone] (M-F; [place])  Please contact [facility] for night-coverage after hours (4p -7a ) and weekends on [email or web address]

## 2015-11-13 NOTE — Interval H&P Note (Signed)
History and Physical Interval Note:  11/13/2015 9:10 AM  Bruce Cole  has presented today for surgery, with the diagnosis of anemia, FOBT + and recent black stool.  The various methods of treatment have been discussed with the patient and family. After consideration of risks, benefits and other options for treatment, the patient has consented to  Procedure(s): ESOPHAGOGASTRODUODENOSCOPY (EGD) (N/A) as a surgical intervention .  The patient's history has been reviewed, patient examined, no change in status, stable for surgery.  I have reviewed the patient's chart and labs.  Questions were answered to the patient's satisfaction.     Scarlette Shorts

## 2015-11-13 NOTE — Progress Notes (Signed)
Patient ID: Bruce Cole, male   DOB: 1941-03-04, 75 y.o.   MRN: [id number]    PROGRESS NOTE    Bruce Cole  [id number] DOB: Jun 21, 1941 DOA: 11/10/2015  PCP: Nance Pear., NP   Outpatient Specialists:   Brief Narrative:  75 y.o. male with rheumatoid arthritis, chronic atrial fibrillation on warfarin, chronic systolic CHF, and chronic kidney disease stage IV who presented to the ED at [facility] for evaluation of dyspnea and malaise 4 days duration. Patient has been followed closely by outpatient cardiologist and nephrologist and had AV fistula created in the left upper extremity on 08/26/2015, but has not yet required dialysis.  ED Course: Upon arrival to the [facility] ED, Chest x-ray demonstrated a right sided pleural effusion with an associated opacity in the right base. Transferred to [facility] for admission.  Major events: 4/27 - more dyspneic in the afternoon 4 pm, ordered CXR as pt unable to tolerate CT, PCCM consulted, ABG pend, also call nephrology team again to consider HD sooner than expected  Assessment & Plan:   1. GI bleed with symptomatic anemia  - 2 units pRBCs ordered for transfusion on admission  - EGD done this AM and notable for mild gastritis and per GI may cause heme positive stool in face of over [facility] - otherwise normal EGD - Recommendation is to continue PPI for upper GI mucosal protection   2. Chronic atrial fibrillation with supratherapeutic INR, bradycardia this AM - CHADS-VASc 3 (age, HTN, CHF) - INR trending down: 5.03 --> 3.8 - continue to hold warfarin until INR returns to goal  - per cardiology, hold coreg until HR stabilizes  - cardiology's assistance is appreciated   3. AKI on CKD IV - SCr is 5.93 on admission, up from 3.3 on 10/27/15  - Nephrology is consulting and much appreciated  - AVF in LUE created on 08/26/15 - trialysis catheter placed last night (AV fistula not sufficient at this point), had one HD  session last night, 2 L fluid removed   4. Chronic systolic CHF/Volume overload  - remains volume overloaded and currently receiving 120 mg IV lasix BID with poor urine output - TTE (08/18/15) with EF 40-45%  - Wt is 265 on admission --> 275 lbs --> 272 lbs this AM - per nephrology   5. Hypertension, essential - At goal currently, on Hydrazine, Lasix, Imdur   6. Rheumatoid arthritis  - Stable  - Continue home-dose Plaquenil   7. Right pleural effusion  - CT chest confirms right pleural effusion with extensive passive atelectasis, loculated component of the pleural effusion  - d/w PCCM if needs to be taped, appreciate assistance   8. Prolonged QTc - QTc 555 ms on admission  - Avoiding agents that would further prolong  - Monitor on telemetry   9. Obesity  - Body mass index is 34.15 kg/(m^2).  DVT prophylaxis: SCD's for now  Code Status: Full Family Communication: Patient at bedside  Disposition Plan: pending improvement in symptoms   Consultants:   Nephrology  Cardiology   GI  PCCM  Procedures:   EGD 4/28 - mild gastritis, continue with PPI  Antimicrobials:  None   Subjective: Denies chest pain or shortness of breath. Reports feeling better  Objective: Filed Vitals:   11/12/15 2140 11/12/15 2212 11/13/15 0058 11/13/15 0341  BP: 123/49 103/62  107/55  Pulse: 53 55  56  Temp: 97.5 F (36.4 C) 97.6 F (36.4 C)  98.9 F (37.2 C)  TempSrc: Oral Oral  Oral  Resp: 23 18  19   Height:      Weight: 123.4 kg (272 lb 0.8 oz)  123.605 kg (272 lb 8 oz)   SpO2: 100% 100%  95%    Intake/Output Summary (Last 24 hours) at 11/13/15 0705 Last data filed at 11/13/15 0058  Gross per 24 hour  Intake    650 ml  Output   2050 ml  Net  -1400 ml   Filed Weights   11/12/15 1910 11/12/15 2140 11/13/15 0058  Weight: 125.4 kg (276 lb 7.3 oz) 123.4 kg (272 lb 0.8 oz) 123.605 kg (272 lb 8 oz)    Examination: General: Calm, alert, NAD HEENT: normal Neck: supple.  JVP jaw. Carotids 2+ bilat; no bruits. No lymphadenopathy  Cor: PMI nondisplaced. Irregularly irregular rate & rhythm. Bradycardic. No murmurs. Lungs: Dull R > L Abdomen: soft, nontender, non distended. BS present  Extremities: LE edema + 2, LUE AVF  Data Reviewed: I have personally reviewed following labs and imaging studies  CBC:  Recent Labs Lab 11/10/15 1550 11/11/15 0821 11/12/15 0507  WBC 5.9 5.9 6.0  NEUTROABS 4.7  --   --   HGB 6.5* 7.6* 7.6*  HCT 20.8* 24.6* 24.4*  MCV 88.1 86.3 87.5  PLT 239 221 [id number]   Basic Metabolic Panel:  Recent Labs Lab 11/10/15 1550 11/10/15 2144 11/11/15 0821 11/12/15 0507 11/13/15 0513  NA 136  --  137  137 137 137  K 3.5  --  3.6  3.7 3.7 3.8  CL 93*  --  95*  95* 93* 94*  CO2 25  --  29  29 30 28   GLUCOSE 108*  --  107*  104* 118* 107*  BUN 128*  --  136*  137* 136* 116*  CREATININE 5.93*  --  5.99*  6.10* 6.77* 6.53*  CALCIUM 9.4  --  9.5  9.5 9.4 9.3  MG  --  2.6*  --   --   --   PHOS  --   --  5.5* 6.0* 5.9*   Liver Function Tests:  Recent Labs Lab 11/10/15 1550 11/11/15 0821 11/12/15 0507 11/13/15 0513  AST 29  --   --   --   ALT 14*  --   --   --   ALKPHOS 114  --   --   --   BILITOT 0.7  --   --   --   PROT 7.1  --   --   --   ALBUMIN 4.0 3.6 3.7 3.7   Coagulation Profile:  Recent Labs Lab 11/10/15 1550 11/11/15 0821 11/12/15 0507 11/13/15 0513  INR 5.58* 5.03* 3.80* 3.88*   Cardiac Enzymes:  Recent Labs Lab 11/10/15 1550  TROPONINI 0.10*   Urine analysis:    Component Value Date/Time   COLORURINE YELLOW 11/10/2015 1800   APPEARANCEUR CLEAR 11/10/2015 1800   LABSPEC 1.012 11/10/2015 1800   PHURINE 5.5 11/10/2015 1800   GLUCOSEU NEGATIVE 11/10/2015 1800   GLUCOSEU NEGATIVE 05/05/2015 1135   HGBUR NEGATIVE 11/10/2015 1800   HGBUR negative 09/21/2009 1008   BILIRUBINUR NEGATIVE 11/10/2015 1800   KETONESUR NEGATIVE 11/10/2015 1800   PROTEINUR NEGATIVE 11/10/2015 1800   UROBILINOGEN  0.2 05/05/2015 1135   NITRITE NEGATIVE 11/10/2015 1800   LEUKOCYTESUR NEGATIVE 11/10/2015 1800    Recent Results (from the past 240 hour(s))  MRSA PCR Screening     Status: None   Collection Time: 11/10/15  8:50 PM  Result Value  Ref Range Status   MRSA by PCR NEGATIVE NEGATIVE Final    Radiology Studies: Dg Chest Port 1 View 11/10/2015  Right pleural effusion and basilar pulmonary opacity. Cardiomegaly is noted.  Ct Chest Wo Contrast 11/12/2015  Prominent systemic venous structures favoring elevated right heart pressures. Prominent right atrial enlargement and moderate enlargement of the rest of the heart. 2. I am unsure if the low-density posteriorly into the right in the right auricle represents the dentate margin of the right auricle or thrombus. Consider echocardiography with attention to this region. 3. Large right pleural effusion with extensive passive atelectasis. Loculated component of the pleural effusion anteriorly. 4. Prominent airway thickening and near collapse in the right mainstem bronchus, right bronchus intermedius, and in the left lower lobe bronchi. This raises suspicion for the possibility of bronchomalacia. 5. Diffuse subcutaneous edema and mild perisplenic ascites, likely due to diffuse third spacing of fluid. There is also bilateral gynecomastia. 6. Stable aneurysmal thoracic aorta, measuring up to 4.7 cm along the posterior arch.   Dg Chest Port 1 View 11/12/2015  Right IJ line tip is projects in the lower right internal jugular vein, just above the brachiocephalic confluence. If SVC placement is desired, advanced 3 cm. 2. Stable large right pleural effusion and stable cardiomegaly   Scheduled Meds: . allopurinol  200 mg Oral Daily  . darbepoetin (ARANESP) injection - NON-DIALYSIS  100 mcg Subcutaneous Q Wed-1800  . fluticasone  2 spray Each Nare Daily  . furosemide  160 mg Intravenous BID  . hydrALAZINE  50 mg Oral BID  . hydroxychloroquine  200 mg Oral BID  .  isosorbide mononitrate  30 mg Oral Daily  . multivitamin with minerals  1 tablet Oral Daily  . pantoprazole  40 mg Oral BID  . sevelamer carbonate  800 mg Oral TID WC  . sodium chloride flush  3 mL Intravenous Q12H  . terazosin  1 mg Oral QHS   Continuous Infusions:    LOS: 3 days   Time spent: 20 minutes   Faye Ramsay, MD [facility] Pager [phone]  If 7PM-7AM, please contact night-coverage [email or web address] Password [email or web address] 11/13/2015, 7:05 AM

## 2015-11-13 NOTE — Procedures (Signed)
[place]  1. LArge rt effusion, lung flap about 4 cm away from pleural surface, free flowing 2. No effusion left  Lavon Paganini. Titus Mould, MD, Kevin Pgr: [facility]

## 2015-11-13 NOTE — Anesthesia Postprocedure Evaluation (Signed)
Anesthesia Post Note  Patient: Bruce Cole  Procedure(s) Performed: Procedure(s) (LRB): ESOPHAGOGASTRODUODENOSCOPY (EGD) (N/A)  Patient location during evaluation: PACU Anesthesia Type: MAC Level of consciousness: awake and alert Pain management: pain level controlled Vital Signs Assessment: post-procedure vital signs reviewed and stable Respiratory status: spontaneous breathing, nonlabored ventilation, respiratory function stable and patient connected to nasal cannula oxygen Cardiovascular status: stable and blood pressure returned to baseline Anesthetic complications: no    Last Vitals:  Filed Vitals:   11/13/15 0950 11/13/15 0955  BP: 138/56 145/56  Pulse: 65 61  Temp: 36.5 C   Resp: 15 21    Last Pain:  Filed Vitals:   11/13/15 0955  PainSc: 0-No pain                 [name]

## 2015-11-13 NOTE — Care Management Important Message (Signed)
Important Message  Patient Details  Name: Bruce Cole MRN: [id number] Date of Birth: [birth year]   Medicare Important Message Given:  Yes    Bethena Roys, RN 11/13/2015, 2:20 PM

## 2015-11-14 ENCOUNTER — Encounter (HOSPITAL_COMMUNITY): Payer: Self-pay | Admitting: Internal Medicine

## 2015-11-14 LAB — RENAL FUNCTION PANEL
Albumin: 3.6 g/dL (ref 3.5–5.0)
Anion gap: 14 (ref 5–15)
BUN: 85 mg/dL — AB (ref 6–20)
CHLORIDE: 96 mmol/L — AB (ref 101–111)
CO2: 28 mmol/L (ref 22–32)
Calcium: 9.4 mg/dL (ref 8.9–10.3)
Creatinine, Ser: 6.01 mg/dL — ABNORMAL HIGH (ref 0.61–1.24)
GFR calc non Af Amer: 8 mL/min — ABNORMAL LOW (ref >60)
GFR, EST AFRICAN AMERICAN: 10 mL/min — AB (ref >60)
Glucose, Bld: 117 mg/dL — ABNORMAL HIGH (ref 65–99)
POTASSIUM: 3.9 mmol/L (ref 3.5–5.1)
Phosphorus: 5.2 mg/dL — ABNORMAL HIGH (ref 2.5–4.6)
Sodium: 138 mmol/L (ref 135–145)

## 2015-11-14 LAB — HEPATITIS B CORE ANTIBODY, TOTAL: Hep B Core Total Ab: NEGATIVE

## 2015-11-14 LAB — CBC
HEMATOCRIT: 25.5 % — AB (ref 39.0–52.0)
Hemoglobin: 7.8 g/dL — ABNORMAL LOW (ref 13.0–17.0)
MCH: 27.3 pg (ref 26.0–34.0)
MCHC: 30.6 g/dL (ref 30.0–36.0)
MCV: 89.2 fL (ref 78.0–100.0)
Platelets: 138 10*3/uL — ABNORMAL LOW (ref 150–400)
RBC: 2.86 MIL/uL — AB (ref 4.22–5.81)
RDW: 17.6 % — ABNORMAL HIGH (ref 11.5–15.5)
WBC: 6.4 10*3/uL (ref 4.0–10.5)

## 2015-11-14 LAB — HEPATITIS B SURFACE ANTIBODY,QUALITATIVE: HEP B S AB: NONREACTIVE

## 2015-11-14 LAB — PROTIME-INR
INR: 3.39 — AB (ref 0.00–1.49)
PROTHROMBIN TIME: 33.5 s — AB (ref 11.6–15.2)

## 2015-11-14 LAB — LIPASE, BLOOD: Lipase: 92 U/L — ABNORMAL HIGH (ref 11–51)

## 2015-11-14 LAB — AMYLASE: Amylase: 268 U/L — ABNORMAL HIGH (ref 28–100)

## 2015-11-14 MED ORDER — HYDROCODONE-ACETAMINOPHEN 5-325 MG PO TABS
1.0000 | ORAL_TABLET | ORAL | Status: DC | PRN
  Administered 2015-11-14: 2 via ORAL
  Administered 2015-11-14 – 2015-11-19 (×2): 1 via ORAL
  Administered 2015-11-20 – 2015-11-21 (×2): 2 via ORAL
  Administered 2015-11-22 – 2015-11-23 (×2): 1 via ORAL
  Administered 2015-11-23 – 2015-11-24 (×4): 2 via ORAL
  Administered 2015-11-25: 1 via ORAL
  Filled 2015-11-14: qty 1
  Filled 2015-11-14 (×2): qty 2
  Filled 2015-11-14: qty 1
  Filled 2015-11-14 (×2): qty 2
  Filled 2015-11-14: qty 1
  Filled 2015-11-14: qty 2
  Filled 2015-11-14: qty 1
  Filled 2015-11-14 (×2): qty 2
  Filled 2015-11-14: qty 1

## 2015-11-14 NOTE — Progress Notes (Signed)
Patient ID: Bruce Cole, male   DOB: 17-May-1941, 75 y.o.   MRN: [name]    PROGRESS NOTE    Bruce Cole  [name] DOB: [birth year] DOA: 11/10/2015  PCP: Nance Pear., NP   Outpatient Specialists:   Brief Narrative:  75 y.o. male with rheumatoid arthritis, chronic atrial fibrillation on warfarin, chronic systolic CHF, and chronic kidney disease stage IV who presented to the ED at [facility] for evaluation of dyspnea and malaise 4 days duration. Patient has been followed closely by outpatient cardiologist and nephrologist and had AV fistula created in the left upper extremity on 08/26/2015, but has not yet required dialysis.  ED Course: Upon arrival to the [facility] ED, Chest x-ray demonstrated a right sided pleural effusion with an associated opacity in the right base. Transferred to [facility] for admission.  Major events: 4/27 - more dyspneic in the afternoon 4 pm, ordered CXR as pt unable to tolerate CT, PCCM consulted, ABG pend, also call nephrology team again to consider HD sooner than expected  Assessment & Plan:   1. GI bleed with symptomatic anemia  - 2 units pRBCs ordered for transfusion on admission  - EGD done 4/28, notable for mild gastritis and per GI may cause heme positive stool in face of over [facility] - otherwise normal EGD - Recommendation is to continue PPI for upper GI mucosal protection  - pt more abd pain and nausea this AM, will ensure he gets antiemetics and analgesia as needed - may need abd imaging if not better   2. Chronic atrial fibrillation with supratherapeutic INR, bradycardia this AM - CHADS-VASc 3 (age, HTN, CHF) - INR trending down: 5.03 --> 3.8 - continue to hold warfarin until INR returns to goal  - per cardiology, hold coreg  - cardiology's assistance is appreciated   3. AKI on CKD IV - SCr is 5.93 on admission, up from 3.3 on 10/27/15  - Nephrology is consulting and much appreciated  - AVF in LUE created  on 08/26/15 - trialysis catheter placed last night (AV fistula not sufficient at this point), had one HD session last night, 2 L fluid removed   4. Chronic systolic CHF/Volume overload  - currently receiving 160 mg IV lasix BID with poor urine output - TTE (08/18/15) with EF 40-45%  - Wt is 265 on admission --> 275 lbs --> 272 lbs --> 262 lbs this AM - HD per nephrology   5. Hypertension, essential - currently on Hydrazine, Lasix, Imdur   6. Rheumatoid arthritis  - Stable  - Continue home-dose Plaquenil   7. Right pleural effusion  - CT chest confirms right pleural effusion with extensive passive atelectasis, loculated component of the pleural effusion  - d/w PCCM if needs to be taped, appreciate assistance  - monitor for now, once INR down, will likely need tap  8. Prolonged QTc - QTc 555 ms on admission  - Avoiding agents that would further prolong  - Monitor on telemetry   9. Obesity  - Body mass index is 34.15 kg/(m^2).  10. Thrombocytopenia - mild, reactive, monitor   DVT prophylaxis: SCD's for now  Code Status: Full Family Communication: Patient at bedside  Disposition Plan: pending improvement in symptoms   Consultants:   Nephrology  Cardiology   GI  PCCM  Vascular   Procedures:   EGD 4/28 - mild gastritis, continue with PPI  Antimicrobials:  None   Subjective: Reports nausea and abd discomfort, non radiating, no vomiting.  Objective: Filed Vitals:   11/14/15 0033 11/14/15 0400 11/14/15 0715 11/14/15 0723  BP: 110/77 153/95 105/57 102/68  Pulse: 54 57 56 55  Temp: 97.8 F (36.6 C) 99.1 F (37.3 C)    TempSrc: Oral  Oral   Resp: 21 24 19 22   Height:      Weight:  121.972 kg (268 lb 14.4 oz) 121.9 kg (268 lb 11.9 oz)   SpO2: 97% 95%      Intake/Output Summary (Last 24 hours) at 11/14/15 0753 Last data filed at 11/14/15 0400  Gross per 24 hour  Intake    430 ml  Output   2150 ml  Net  -1720 ml   Filed Weights   11/13/15 1736  11/14/15 0400 11/14/15 0715  Weight: 121.5 kg (267 lb 13.7 oz) 121.972 kg (268 lb 14.4 oz) 121.9 kg (268 lb 11.9 oz)    Examination: General: Calm, alert, NAD HEENT: normal Neck: supple. JVP jaw. Carotids 2+ bilat; no bruits. No lymphadenopathy  Cor: PMI nondisplaced. Irregularly irregular rate & rhythm. Bradycardic. No murmurs. Lungs: Dull R > L Abdomen: soft, nontender, non distended. BS present  Extremities: LE edema + 2, LUE AVF  Data Reviewed: I have personally reviewed following labs and imaging studies  CBC:  Recent Labs Lab 11/10/15 1550 11/11/15 0821 11/12/15 0507 11/13/15 1042 11/14/15 0625  WBC 5.9 5.9 6.0 5.9 6.4  NEUTROABS 4.7  --   --   --   --   HGB 6.5* 7.6* 7.6* 7.8* 7.8*  HCT 20.8* 24.6* 24.4* 25.5* 25.5*  MCV 88.1 86.3 87.5 90.4 89.2  PLT 239 221 221 167 0000000*   Basic Metabolic Panel:  Recent Labs Lab 11/10/15 1550 11/10/15 2144 11/11/15 0821 11/12/15 0507 11/13/15 0513 11/14/15 0625  NA 136  --  137  137 137 137 138  K 3.5  --  3.6  3.7 3.7 3.8 3.9  CL 93*  --  95*  95* 93* 94* 96*  CO2 25  --  29  29 30 28 28   GLUCOSE 108*  --  107*  104* 118* 107* 117*  BUN 128*  --  136*  137* 136* 116* 85*  CREATININE 5.93*  --  5.99*  6.10* 6.77* 6.53* 6.01*  CALCIUM 9.4  --  9.5  9.5 9.4 9.3 9.4  MG  --  2.6*  --   --   --   --   PHOS  --   --  5.5* 6.0* 5.9* 5.2*   Liver Function Tests:  Recent Labs Lab 11/10/15 1550 11/11/15 0821 11/12/15 0507 11/13/15 0513 11/14/15 0625  AST 29  --   --   --   --   ALT 14*  --   --   --   --   ALKPHOS 114  --   --   --   --   BILITOT 0.7  --   --   --   --   PROT 7.1  --   --   --   --   ALBUMIN 4.0 3.6 3.7 3.7 3.6   Coagulation Profile:  Recent Labs Lab 11/10/15 1550 11/11/15 0821 11/12/15 0507 11/13/15 0513 11/14/15 0625  INR 5.58* 5.03* 3.80* 3.88* 3.39*   Cardiac Enzymes:  Recent Labs Lab 11/10/15 1550  TROPONINI 0.10*   Urine analysis:    Component Value Date/Time    COLORURINE YELLOW 11/10/2015 1800   APPEARANCEUR CLEAR 11/10/2015 1800   LABSPEC 1.012 11/10/2015 1800   PHURINE 5.5  11/10/2015 1800   GLUCOSEU NEGATIVE 11/10/2015 1800   GLUCOSEU NEGATIVE 05/05/2015 1135   HGBUR NEGATIVE 11/10/2015 1800   HGBUR negative 09/21/2009 1008   BILIRUBINUR NEGATIVE 11/10/2015 1800   KETONESUR NEGATIVE 11/10/2015 1800   PROTEINUR NEGATIVE 11/10/2015 1800   UROBILINOGEN 0.2 05/05/2015 1135   NITRITE NEGATIVE 11/10/2015 1800   LEUKOCYTESUR NEGATIVE 11/10/2015 1800    Recent Results (from the past 240 hour(s))  MRSA PCR Screening     Status: None   Collection Time: 11/10/15  8:50 PM  Result Value Ref Range Status   MRSA by PCR NEGATIVE NEGATIVE Final    Radiology Studies: Dg Chest Port 1 View 11/10/2015  Right pleural effusion and basilar pulmonary opacity. Cardiomegaly is noted.  Ct Chest Wo Contrast 11/12/2015  Prominent systemic venous structures favoring elevated right heart pressures. Prominent right atrial enlargement and moderate enlargement of the rest of the heart. 2. I am unsure if the low-density posteriorly into the right in the right auricle represents the dentate margin of the right auricle or thrombus. Consider echocardiography with attention to this region. 3. Large right pleural effusion with extensive passive atelectasis. Loculated component of the pleural effusion anteriorly. 4. Prominent airway thickening and near collapse in the right mainstem bronchus, right bronchus intermedius, and in the left lower lobe bronchi. This raises suspicion for the possibility of bronchomalacia. 5. Diffuse subcutaneous edema and mild perisplenic ascites, likely due to diffuse third spacing of fluid. There is also bilateral gynecomastia. 6. Stable aneurysmal thoracic aorta, measuring up to 4.7 cm along the posterior arch.   Dg Chest Port 1 View 11/12/2015  Right IJ line tip is projects in the lower right internal jugular vein, just above the brachiocephalic  confluence. If SVC placement is desired, advanced 3 cm. 2. Stable large right pleural effusion and stable cardiomegaly   Scheduled Meds: . allopurinol  200 mg Oral Daily  . darbepoetin (ARANESP) injection - NON-DIALYSIS  100 mcg Subcutaneous Q Wed-1800  . fluticasone  2 spray Each Nare Daily  . furosemide  160 mg Intravenous BID  . hydrALAZINE  50 mg Oral BID  . hydroxychloroquine  200 mg Oral BID  . isosorbide mononitrate  30 mg Oral Daily  . multivitamin with minerals  1 tablet Oral Daily  . pantoprazole  40 mg Oral BID  . sevelamer carbonate  800 mg Oral TID WC  . sodium chloride flush  3 mL Intravenous Q12H  . terazosin  1 mg Oral QHS   Continuous Infusions:    LOS: 4 days   Time spent: 20 minutes   Faye Ramsay, MD [facility] Pager [phone]  If 7PM-7AM, please contact night-coverage [email or web address] Password [facility] 11/14/2015, 7:53 AM

## 2015-11-14 NOTE — Procedures (Signed)
Patient was seen on dialysis and the procedure was supervised. BFR 400 Via RIJ trialysis catheter BP is 104/27.  Patient appears to be tolerating treatment well but has been complaining of Nausea and abdominal pain radiating straight to his back prior to starting HD.  He has been given zofran but nausea and pain have been persistent.  Will order amylase and lipase and may need abdominal imaging.  Hopefully will have TDC and fistulogram early next week per [facility]VS.

## 2015-11-15 LAB — RENAL FUNCTION PANEL
ALBUMIN: 3.5 g/dL (ref 3.5–5.0)
ALBUMIN: 3.7 g/dL (ref 3.5–5.0)
ANION GAP: 12 (ref 5–15)
ANION GAP: 13 (ref 5–15)
BUN: 59 mg/dL — AB (ref 6–20)
BUN: 61 mg/dL — AB (ref 6–20)
CHLORIDE: 97 mmol/L — AB (ref 101–111)
CHLORIDE: 99 mmol/L — AB (ref 101–111)
CO2: 24 mmol/L (ref 22–32)
CO2: 27 mmol/L (ref 22–32)
Calcium: 9.3 mg/dL (ref 8.9–10.3)
Calcium: 9.4 mg/dL (ref 8.9–10.3)
Creatinine, Ser: 5.27 mg/dL — ABNORMAL HIGH (ref 0.61–1.24)
Creatinine, Ser: 5.31 mg/dL — ABNORMAL HIGH (ref 0.61–1.24)
GFR calc Af Amer: 11 mL/min — ABNORMAL LOW (ref >60)
GFR calc non Af Amer: 10 mL/min — ABNORMAL LOW (ref >60)
GFR, EST AFRICAN AMERICAN: 11 mL/min — AB (ref >60)
GFR, EST NON AFRICAN AMERICAN: 10 mL/min — AB (ref >60)
GLUCOSE: 100 mg/dL — AB (ref 65–99)
Glucose, Bld: 94 mg/dL (ref 65–99)
PHOSPHORUS: 5.1 mg/dL — AB (ref 2.5–4.6)
POTASSIUM: 4.1 mmol/L (ref 3.5–5.1)
POTASSIUM: 4.2 mmol/L (ref 3.5–5.1)
Phosphorus: 5 mg/dL — ABNORMAL HIGH (ref 2.5–4.6)
Sodium: 136 mmol/L (ref 135–145)
Sodium: 136 mmol/L (ref 135–145)

## 2015-11-15 LAB — CBC
HEMATOCRIT: 25.1 % — AB (ref 39.0–52.0)
HEMOGLOBIN: 7.8 g/dL — AB (ref 13.0–17.0)
MCH: 27.9 pg (ref 26.0–34.0)
MCHC: 31.1 g/dL (ref 30.0–36.0)
MCV: 89.6 fL (ref 78.0–100.0)
Platelets: 133 10*3/uL — ABNORMAL LOW (ref 150–400)
RBC: 2.8 MIL/uL — ABNORMAL LOW (ref 4.22–5.81)
RDW: 17.5 % — AB (ref 11.5–15.5)
WBC: 6.9 10*3/uL (ref 4.0–10.5)

## 2015-11-15 LAB — PROTIME-INR
INR: 2.95 — ABNORMAL HIGH (ref 0.00–1.49)
PROTHROMBIN TIME: 30.2 s — AB (ref 11.6–15.2)

## 2015-11-15 NOTE — Progress Notes (Signed)
Called to assess pt for SOB. Pt states he does not take any treatments at home, RN had explained he had taken oxygen off several times. I placed his oxygen back on, he seemed to be exerted from trying to lay back in bed and legs and feet swollen. BBS clear with diminished bases. I asked about a breathing treatment and he asked how that works. After explaining patient was not interested and just wanted help getting back in bed. Charge nurse notified.

## 2015-11-15 NOTE — Progress Notes (Signed)
Patient ID: LAFE MATIAS, male   DOB: March 09, 1941, 75 y.o.   MRN: [name] S: Feels better today but did not feel well at HD yesterday. O:BP 119/60 mmHg  Pulse 66  Temp(Src) 97.9 F (36.6 C) (Oral)  Resp 22  Ht 6\' 3"  (1.905 m)  Wt 118.978 kg (262 lb 4.8 oz)  BMI 32.79 kg/m2  SpO2 92%  Intake/Output Summary (Last 24 hours) at 11/15/15 0845 Last data filed at 11/15/15 0500  Gross per 24 hour  Intake    230 ml  Output   3100 ml  Net  -2870 ml   Intake/Output: I/O last 3 completed shifts: In: 410 [P.O.:360; IV Piggyback:50] Out: 3150 [Urine:150; Other:3000]  Intake/Output this shift:    Weight change: -1.6 kg (-3 lb 8.4 oz) Gen:WD WN AAM in NAD CVS:no rub Resp: decreased BS at bases Abd:+BS, soft, NT Ext:2+ edema, L wrist AVF +T/B, small caliber   Recent Labs Lab 11/10/15 1550 11/11/15 0821 11/12/15 0507 11/13/15 0513 11/14/15 0625 11/15/15 0521  NA 136 137  137 137 137 138 136  136  K 3.5 3.6  3.7 3.7 3.8 3.9 4.1  4.2  CL 93* 95*  95* 93* 94* 96* 97*  99*  CO2 25 29  29 30 28 28 27  24   GLUCOSE 108* 107*  104* 118* 107* 117* 100*  94  BUN 128* 136*  137* 136* 116* 85* 59*  61*  CREATININE 5.93* 5.99*  6.10* 6.77* 6.53* 6.01* 5.27*  5.31*  ALBUMIN 4.0 3.6 3.7 3.7 3.6 3.7  3.5  CALCIUM 9.4 9.5  9.5 9.4 9.3 9.4 9.4  9.3  PHOS  --  5.5* 6.0* 5.9* 5.2* 5.0*  5.1*  AST 29  --   --   --   --   --   ALT 14*  --   --   --   --   --    Liver Function Tests:  Recent Labs Lab 11/10/15 1550  11/13/15 0513 11/14/15 0625 11/15/15 0521  AST 29  --   --   --   --   ALT 14*  --   --   --   --   ALKPHOS 114  --   --   --   --   BILITOT 0.7  --   --   --   --   PROT 7.1  --   --   --   --   ALBUMIN 4.0  < > 3.7 3.6 3.7  3.5  < > = values in this interval not displayed.  Recent Labs Lab 11/14/15 0625  LIPASE 92*  AMYLASE 268*   No results for input(s): AMMONIA in the last 168 hours. CBC:  Recent Labs Lab 11/10/15 1550 11/11/15 0821  11/12/15 0507 11/13/15 1042 11/14/15 0625 11/15/15 0521  WBC 5.9 5.9 6.0 5.9 6.4 6.9  NEUTROABS 4.7  --   --   --   --   --   HGB 6.5* 7.6* 7.6* 7.8* 7.8* 7.8*  HCT 20.8* 24.6* 24.4* 25.5* 25.5* 25.1*  MCV 88.1 86.3 87.5 90.4 89.2 89.6  PLT 239 221 221 167 138* 133*   Cardiac Enzymes:  Recent Labs Lab 11/10/15 1550  TROPONINI 0.10*   CBG: No results for input(s): GLUCAP in the last 168 hours.  Iron Studies: No results for input(s): IRON, TIBC, TRANSFERRIN, FERRITIN in the last 72 hours. Studies/Results: No results found. [name] allopurinol  200 mg Oral Daily  . darbepoetin (ARANESP)  injection - NON-DIALYSIS  100 mcg Subcutaneous Q Wed-1800  . fluticasone  2 spray Each Nare Daily  . furosemide  160 mg Intravenous BID  . hydrALAZINE  50 mg Oral BID  . hydroxychloroquine  200 mg Oral BID  . isosorbide mononitrate  30 mg Oral Daily  . multivitamin with minerals  1 tablet Oral Daily  . pantoprazole  40 mg Oral BID  . sevelamer carbonate  800 mg Oral TID WC  . sodium chloride flush  3 mL Intravenous Q12H  . terazosin  1 mg Oral QHS    BMET    Component Value Date/Time   NA 136 11/15/2015 0521   NA 136 11/15/2015 0521   K 4.1 11/15/2015 0521   K 4.2 11/15/2015 0521   CL 97* 11/15/2015 0521   CL 99* 11/15/2015 0521   CO2 27 11/15/2015 0521   CO2 24 11/15/2015 0521   GLUCOSE 100* 11/15/2015 0521   GLUCOSE 94 11/15/2015 0521   BUN 59* 11/15/2015 0521   BUN 61* 11/15/2015 0521   CREATININE 5.27* 11/15/2015 0521   CREATININE 5.31* 11/15/2015 0521   CREATININE 1.77* 02/13/2013 1505   CALCIUM 9.4 11/15/2015 0521   CALCIUM 9.3 11/15/2015 0521   GFRNONAA 10* 11/15/2015 0521   GFRNONAA 10* 11/15/2015 0521   GFRAA 11* 11/15/2015 0521   GFRAA 11* 11/15/2015 0521   CBC    Component Value Date/Time   WBC 6.9 11/15/2015 0521   RBC 2.80* 11/15/2015 0521   RBC 2.79* 11/12/2015 0507   HGB 7.8* 11/15/2015 0521   HCT 25.1* 11/15/2015 0521   PLT 133* 11/15/2015 0521   MCV 89.6  11/15/2015 0521   MCH 27.9 11/15/2015 0521   MCHC 31.1 11/15/2015 0521   RDW 17.5* 11/15/2015 0521   LYMPHSABS 0.7 11/10/2015 1550   MONOABS 0.5 11/10/2015 1550   EOSABS 0.0 11/10/2015 1550   BASOSABS 0.0 11/10/2015 1550   Assessment/Plan:  1. AKI/CKD- likely progression to ESRD due to advanced CKD stage 4 prior to admission.  Pt has remained oliguric unresponsive to high dose diuretics.  HD initiated 4/27 and again 11/14/15.  Plan for HD again tomorrow.   2. ABLA- presumably due to gastritis in setting of anticoagulation.  He has been transfused 2 uPRBC's, aranesp started on 11/11/15.  Continue to follow and transfuse prn. 3. CHF- cont with UF with HD.  Will d/c lasix tomorrow if he still does not have significant UOP. 4. Vascular access- has temporary HD catheter and awaiting VVS to place tunneled HD cath as well as fistulogram and possible revision of L AVF (which is small caliber and likely with inflow issues).   5. A fib on coumadin- supratherapeutic INR.  Coumadin on hold for GIB and pending VVS intervention.Govern[facility] [place] Thorsten Climer

## 2015-11-15 NOTE — Progress Notes (Signed)
Patient ID: Bruce Cole, male   DOB: 1940/07/27, 75 y.o.   MRN: [name]    PROGRESS NOTE    Bruce Cole  [name] DOB: 1941/05/03 DOA: 11/10/2015  PCP: Nance Pear., NP   Outpatient Specialists:   Brief Narrative:  75 y.o. male with rheumatoid arthritis, chronic atrial fibrillation on warfarin, chronic systolic CHF, and chronic kidney disease stage IV who presented to the ED at [facility] for evaluation of dyspnea and malaise 4 days duration. Patient has been followed closely by outpatient cardiologist and nephrologist and had AV fistula created in the left upper extremity on 08/26/2015, but has not yet required dialysis.  ED Course: Upon arrival to the [facility] ED, Chest x-ray demonstrated a right sided pleural effusion with an associated opacity in the right base. Transferred to [facility] for admission.  Major events: 4/27 - more dyspneic in the afternoon 4 pm, ordered CXR as pt unable to tolerate CT, PCCM consulted, ABG pend, also call nephrology team again to consider HD sooner than expected  Assessment & Plan:   1. GI bleed with symptomatic anemia  - 2 units pRBCs ordered for transfusion on admission  - EGD done 4/28, notable for mild gastritis and per GI may cause heme positive stool in face of over [facility] - otherwise normal EGD - Recommendation is to continue PPI for upper GI mucosal protection  - provide antiemetics and analgesia as needed  2. Chronic atrial fibrillation with supratherapeutic INR, bradycardia this AM - CHADS-VASc 3 (age, HTN, CH) - INR trending down: 5.03 --> 3.8 - continue to hold warfarin until INR returns to goal, 2.95 this am - per cardiology, hold coreg  - cardiology's assistance is appreciated   3. AKI on CKD IV - SCr is 5.93 on admission, up from 3.3 on 10/27/15  - Nephrology is consulting and much appreciated  - AVF in LUE created on 08/26/15 - trialysis catheter placed 4/28 for urgent HD, so far tolerating  HD well but pt says its rough on him   4. Chronic systolic CHF/Volume overload  - currently receiving 160 mg IV lasix BID with poor urine output - TTE (08/18/15) with EF 40-45%  - Wt is 265 on admission --> 275 lbs --> 272 lbs --> 262 lbs this AM - HD per nephrology   5. Hypertension, essential - currently on Hydrazine, Lasix, Imdur   6. Rheumatoid arthritis  - Stable  - Continue home-dose Plaquenil   7. Right pleural effusion  - CT chest confirms right pleural effusion with extensive passive atelectasis, loculated component of the pleural effusion  - d/w PCCM if needs to be taped, appreciate assistance  - monitor for now, once INR down, will likely need tap  8. Prolonged QTc - QTc 555 ms on admission  - Avoiding agents that would further prolong  - Monitor on telemetry   9. Obesity  - Body mass index is 34.15 kg/(m^2).  10. Thrombocytopenia - mild, reactive, monitor  - CBC in AM  DVT prophylaxis: SCD's for now  Code Status: Full Family Communication: Patient at bedside  Disposition Plan: pending improvement in symptoms   Consultants:   Nephrology  Cardiology   GI  PCCM  Vascular   Procedures:   EGD 4/28 - mild gastritis, continue with PPI  HD cath 4/28   Antimicrobials:  None   Subjective: Reports nausea and abd discomfort, non radiating, no vomiting.   Objective: Filed Vitals:   11/15/15 0400 11/15/15 0707 11/15/15 0740  11/15/15 0759  BP: 145/80  119/60   Pulse: 60  66   Temp: 97.7 F (36.5 C)   97.9 F (36.6 C)  TempSrc:    Oral  Resp: 22     Height:      Weight:  118.978 kg (262 lb 4.8 oz)    SpO2: 97%  92%     Intake/Output Summary (Last 24 hours) at 11/15/15 1124 Last data filed at 11/15/15 0500  Gross per 24 hour  Intake    230 ml  Output    100 ml  Net    130 ml   Filed Weights   11/14/15 0715 11/14/15 1059 11/15/15 0707  Weight: 121.9 kg (268 lb 11.9 oz) 119.1 kg (262 lb 9.1 oz) 118.978 kg (262 lb 4.8 oz)     Examination: General: Calm, alert, NAD HEENT: normal Neck: Carotids 2+ bilat; no bruits. No lymphadenopathy  Cor: PMI nondisplaced. Irregularly irregular rate & rhythm. No murmurs. Lungs: Dull R > L Abdomen: soft, nontender, non distended. BS present  Extremities: LE edema + 2, LUE AVF  Data Reviewed: I have personally reviewed following labs and imaging studies  CBC:  Recent Labs Lab 11/10/15 1550 11/11/15 0821 11/12/15 0507 11/13/15 1042 11/14/15 0625 11/15/15 0521  WBC 5.9 5.9 6.0 5.9 6.4 6.9  NEUTROABS 4.7  --   --   --   --   --   HGB 6.5* 7.6* 7.6* 7.8* 7.8* 7.8*  HCT 20.8* 24.6* 24.4* 25.5* 25.5* 25.1*  MCV 88.1 86.3 87.5 90.4 89.2 89.6  PLT 239 221 221 167 138* [id number]*   Basic Metabolic Panel:  Recent Labs Lab 11/10/15 2144 11/11/15 0821 11/12/15 0507 11/13/15 0513 11/14/15 0625 11/15/15 0521  NA  --  137  137 137 137 138 136  136  K  --  3.6  3.7 3.7 3.8 3.9 4.1  4.2  CL  --  95*  95* 93* 94* 96* 97*  99*  CO2  --  29  29 30 28 28 27  24   GLUCOSE  --  107*  104* 118* 107* 117* 100*  94  BUN  --  136*  137* 136* 116* 85* 59*  61*  CREATININE  --  5.99*  6.10* 6.77* 6.53* 6.01* 5.27*  5.31*  CALCIUM  --  9.5  9.5 9.4 9.3 9.4 9.4  9.3  MG 2.6*  --   --   --   --   --   PHOS  --  5.5* 6.0* 5.9* 5.2* 5.0*  5.1*   Liver Function Tests:  Recent Labs Lab 11/10/15 1550 11/11/15 0821 11/12/15 0507 11/13/15 0513 11/14/15 0625 11/15/15 0521  AST 29  --   --   --   --   --   ALT 14*  --   --   --   --   --   ALKPHOS 114  --   --   --   --   --   BILITOT 0.7  --   --   --   --   --   PROT 7.1  --   --   --   --   --   ALBUMIN 4.0 3.6 3.7 3.7 3.6 3.7  3.5   Coagulation Profile:  Recent Labs Lab 11/11/15 0821 11/12/15 0507 11/13/15 0513 11/14/15 0625 11/15/15 0521  INR 5.03* 3.80* 3.88* 3.39* 2.95*   Cardiac Enzymes:  Recent Labs Lab 11/10/15 1550  TROPONINI 0.10*   Urine analysis:  Component Value Date/Time    COLORURINE YELLOW 11/10/2015 1800   APPEARANCEUR CLEAR 11/10/2015 1800   LABSPEC 1.012 11/10/2015 1800   PHURINE 5.5 11/10/2015 1800   GLUCOSEU NEGATIVE 11/10/2015 1800   GLUCOSEU NEGATIVE 05/05/2015 1135   HGBUR NEGATIVE 11/10/2015 1800   HGBUR negative 09/21/2009 1008   BILIRUBINUR NEGATIVE 11/10/2015 1800   KETONESUR NEGATIVE 11/10/2015 1800   PROTEINUR NEGATIVE 11/10/2015 1800   UROBILINOGEN 0.2 05/05/2015 1135   NITRITE NEGATIVE 11/10/2015 1800   LEUKOCYTESUR NEGATIVE 11/10/2015 1800    Recent Results (from the past 240 hour(s))  MRSA PCR Screening     Status: None   Collection Time: 11/10/15  8:50 PM  Result Value Ref Range Status   MRSA by PCR NEGATIVE NEGATIVE Final    Radiology Studies: Dg Chest Port 1 View 11/10/2015  Right pleural effusion and basilar pulmonary opacity. Cardiomegaly is noted.  Ct Chest Wo Contrast 11/12/2015  Prominent systemic venous structures favoring elevated right heart pressures. Prominent right atrial enlargement and moderate enlargement of the rest of the heart. 2. I am unsure if the low-density posteriorly into the right in the right auricle represents the dentate margin of the right auricle or thrombus. Consider echocardiography with attention to this region. 3. Large right pleural effusion with extensive passive atelectasis. Loculated component of the pleural effusion anteriorly. 4. Prominent airway thickening and near collapse in the right mainstem bronchus, right bronchus intermedius, and in the left lower lobe bronchi. This raises suspicion for the possibility of bronchomalacia. 5. Diffuse subcutaneous edema and mild perisplenic ascites, likely due to diffuse third spacing of fluid. There is also bilateral gynecomastia. 6. Stable aneurysmal thoracic aorta, measuring up to 4.7 cm along the posterior arch.   Dg Chest Port 1 View 11/12/2015  Right IJ line tip is projects in the lower right internal jugular vein, just above the brachiocephalic  confluence. If SVC placement is desired, advanced 3 cm. 2. Stable large right pleural effusion and stable cardiomegaly   Scheduled Meds: . allopurinol  200 mg Oral Daily  . darbepoetin (ARANESP) injection - NON-DIALYSIS  100 mcg Subcutaneous Q Wed-1800  . fluticasone  2 spray Each Nare Daily  . furosemide  160 mg Intravenous BID  . hydrALAZINE  50 mg Oral BID  . hydroxychloroquine  200 mg Oral BID  . isosorbide mononitrate  30 mg Oral Daily  . multivitamin with minerals  1 tablet Oral Daily  . pantoprazole  40 mg Oral BID  . sevelamer carbonate  800 mg Oral TID WC  . sodium chloride flush  3 mL Intravenous Q12H  . terazosin  1 mg Oral QHS   Continuous Infusions:    LOS: 5 days   Time spent: 20 minutes   Faye Ramsay, MD [facility] Pager [phone]  If 7PM-7AM, please contact night-coverage [email or web address] Password [email or web address] 11/15/2015, 11:24 AM

## 2015-11-16 DIAGNOSIS — R0609 Other forms of dyspnea: Secondary | ICD-10-CM

## 2015-11-16 DIAGNOSIS — J811 Chronic pulmonary edema: Secondary | ICD-10-CM

## 2015-11-16 DIAGNOSIS — R0902 Hypoxemia: Secondary | ICD-10-CM

## 2015-11-16 LAB — RENAL FUNCTION PANEL
ALBUMIN: 3.5 g/dL (ref 3.5–5.0)
ALBUMIN: 3.6 g/dL (ref 3.5–5.0)
Anion gap: 14 (ref 5–15)
Anion gap: 13 (ref 5–15)
BUN: 71 mg/dL — AB (ref 6–20)
BUN: 43 mg/dL — ABNORMAL HIGH (ref 6–20)
CALCIUM: 9.3 mg/dL (ref 8.9–10.3)
CALCIUM: 9 mg/dL (ref 8.9–10.3)
CHLORIDE: 99 mmol/L — AB (ref 101–111)
CO2: 25 mmol/L (ref 22–32)
CO2: 26 mmol/L (ref 22–32)
CREATININE: 4.7 mg/dL — AB (ref 0.61–1.24)
Chloride: 95 mmol/L — ABNORMAL LOW (ref 101–111)
Creatinine, Ser: 6.67 mg/dL — ABNORMAL HIGH (ref 0.61–1.24)
GFR calc Af Amer: 9 mL/min — ABNORMAL LOW (ref >60)
GFR calc non Af Amer: 7 mL/min — ABNORMAL LOW (ref >60)
GFR, EST AFRICAN AMERICAN: 13 mL/min — AB (ref >60)
GFR, EST NON AFRICAN AMERICAN: 11 mL/min — AB (ref >60)
GLUCOSE: 103 mg/dL — AB (ref 65–99)
Glucose, Bld: 86 mg/dL (ref 65–99)
PHOSPHORUS: 5.1 mg/dL — AB (ref 2.5–4.6)
PHOSPHORUS: 3.8 mg/dL (ref 2.5–4.6)
Potassium: 4.2 mmol/L (ref 3.5–5.1)
Potassium: 3.7 mmol/L (ref 3.5–5.1)
SODIUM: 134 mmol/L — AB (ref 135–145)
SODIUM: 138 mmol/L (ref 135–145)

## 2015-11-16 LAB — CBC
HCT: 24.2 % — ABNORMAL LOW (ref 39.0–52.0)
HCT: 24.2 % — ABNORMAL LOW (ref 39.0–52.0)
Hemoglobin: 7.7 g/dL — ABNORMAL LOW (ref 13.0–17.0)
Hemoglobin: 7.6 g/dL — ABNORMAL LOW (ref 13.0–17.0)
MCH: 28.2 pg (ref 26.0–34.0)
MCH: 27.8 pg (ref 26.0–34.0)
MCHC: 31.8 g/dL (ref 30.0–36.0)
MCHC: 31.4 g/dL (ref 30.0–36.0)
MCV: 88.6 fL (ref 78.0–100.0)
MCV: 88.6 fL (ref 78.0–100.0)
PLATELETS: 132 10*3/uL — AB (ref 150–400)
Platelets: 123 10*3/uL — ABNORMAL LOW (ref 150–400)
RBC: 2.73 MIL/uL — ABNORMAL LOW (ref 4.22–5.81)
RBC: 2.73 MIL/uL — ABNORMAL LOW (ref 4.22–5.81)
RDW: 17.7 % — AB (ref 11.5–15.5)
RDW: 17.6 % — AB (ref 11.5–15.5)
WBC: 7.2 10*3/uL (ref 4.0–10.5)
WBC: 6.9 10*3/uL (ref 4.0–10.5)

## 2015-11-16 LAB — PROTIME-INR
INR: 2.27 — AB (ref 0.00–1.49)
PROTHROMBIN TIME: 24.8 s — AB (ref 11.6–15.2)

## 2015-11-16 NOTE — Progress Notes (Signed)
  [facility] Progress Note   Plan fistulogram when INR<1.5, likely Wednesday. INR today. 2.27. Continue to hold coumadin.   Virgina Jock, PA-C [facility] Office: [phone] Pager: [phone] 11/16/2015 10:14 AM

## 2015-11-16 NOTE — Progress Notes (Signed)
Name: Bruce Cole MRN: [name] DOB: [birth year]    ADMISSION DATE:  11/10/2015 CONSULTATION DATE:  4/28  REFERRING MD :  [facility]  CHIEF COMPLAINT:  Rt pleural effusion  BRIEF PATIENT DESCRIPTION: [name]  SIGNIFICANT EVENTS    STUDIES:     HISTORY OF PRESENT ILLNESS:   75 yo MO(272 lbs) with stage 4 kidney disease with left AVF placed 2/17 who presents with increased SOB/Lethargy/riding creatine/volume overloaded. He is followed by heart failure clinic and has documented OSA(which he denies). Long standing Afib for which he is on coumadin(inr 3.88). He has a recurrent right pleural effusion that was tapped in 2/17 for 1500 cc with negative cytology. PCCM asked to place HD cath(done 4/27) and now consult for right effusion. Suspect this will improve once negative fluid balance is obtained with new HD catheter. Renal and and cards are following. We would need to correct inr to perform thoracentesis. Consider follow as negative fluid balance is obtained. PCCM will follow along and be available PRN.  SUBJECTIVE: No events overnight, seen in HD.  VITAL SIGNS: Temp:  [97.8 F (36.6 C)-98.3 F (36.8 C)] 97.8 F (36.6 C) (05/01 1057) Pulse Rate:  [57-66] 61 (05/01 1057) Resp:  [17-23] 22 (05/01 1057) BP: (96-148)/(42-84) 121/72 mmHg (05/01 1057) SpO2:  [90 %-97 %] 94 % (05/01 1057) Weight:  [115.5 kg (254 lb 10.1 oz)-119.523 kg (263 lb 8 oz)] 115.5 kg (254 lb 10.1 oz) (05/01 1057)  PHYSICAL EXAMINATION: General:  MO lethargic AAM Neuro:  Lethargic but follows ciommands HEENT:  Rt I J HD cath Cardiovascular: HSIR IR Lungs:  Decreased bs rt >Left Abdomen:  obsese + bs Musculoskeletal: intact , old rt knee  median scar Skin:  Dry  Recent Labs Lab 11/14/15 0625 11/15/15 0521 11/16/15 0740  NA 138 136  136 134*  K 3.9 4.1  4.2 4.2  CL 96* 97*  99* 95*  CO2 28 27  24 25   BUN 85* 59*  61* 71*  CREATININE 6.01* 5.27*  5.31* 6.67*  GLUCOSE 117* 100*  94 103*    Recent Labs Lab 11/15/15 0521 11/16/15 0533 11/16/15 0740  HGB 7.8* 7.7* 7.6*  HCT 25.1* 24.2* 24.2*  WBC 6.9 7.2 6.9  PLT 133* 132* 123*   I reviewed CXR myself, pulmonary edema noted.  ASSESSMENT:  Principal Problem:    Pleural effusion, right , recurrent in setting of volume overload   Symptomatic anemia   Essential hypertension   Rheumatoid arthritis ([place])   Diabetes mellitus type 2, controlled, with complications ([place])   CKD (chronic kidney disease), stage IV (HCC)   Chronic combined systolic and diastolic heart failure (HCC)   Acute renal failure superimposed on stage 3 chronic kidney disease   Chronic atrial fibrillation (HCC)   AKI (acute kidney injury) ([place])   Cardiorenal disease   GI bleed   Supratherapeutic INR   Heme positive stool   Coagulopathy (HCC)   Gastritis and gastroduodenitis    Discussion: 75 yo MO(272 lbs) with stage 4 kidney disease with left AVF placed 2/17 who presents with increased SOB/Lethargy/riding creatine/volume overloaded. He is followed by heart failure clinic and has documented OSA(which he denies). Long standing Afib for which he is on coumadin(inr 3.88). He has a recurrent right pleural effusion that was tapped in 2/17 for 1500 cc with negative cytology. PCCM asked to place HD cath(done 4/27) and now consult for right effusion. Suspect this will improve once negative fluid balance is obtained with new HD  catheter. Renal and and cards are following. We would need to correct inr to perform thoracentesis. Consider follow as negative fluid balance is obtained.   Discussed with PCCM-NP.  DOE: due to pulmonary edema.  - HD with negative balance today.  - Limit fluid intake.  OSA: suspected.  - F/U as outpatient upon discharge for a sleep study.  Hypoxemia:  - Titrate O2 for sat of 88-92%.  - Ambulatory desaturation study to evaluate for home O2.  Rush Farmer, M.D. [facility]monary[facility] Medicine. Pager:  [phone]. After hours pager: [phone].  11/16/2015 1:11 PM

## 2015-11-16 NOTE — Progress Notes (Signed)
Pt refuses bipap/cpap at this time.  Rt will monitor.

## 2015-11-16 NOTE — Procedures (Signed)
Assessment/Plan:  1. AKI/CKD- likely progression to ESRD, new start Plan for HD again tomorrow for volume.  2. ABLA-  due to gastritis in setting of anticoagulation. Transfused 2 uPRBC's, aranesp started on 11/11/15. . 3. CHF- cont with UF with HD. Will d/c lasix, Hytrin and hydralazine to optimize fluid removal and hemodynamic stability.. 4. Vascular access- has temporary HD catheter and awaiting VVS to place tunneled HD cath as well as fistulogram and possible revision of L AVF (which is small caliber and likely with inflow issues).  5. A fib on coumadin- supratherapeutic INR. Coumadin on hold for GIB and pending VVS intervention..  Subjective: Interval History: Abd pain better. Lipase up some. Has SOB  Objective: Vital signs in last 24 hours: Temp:  [97.8 F (36.6 C)-98.3 F (36.8 C)] 98 F (36.7 C) (05/01 0720) Pulse Rate:  [57-69] 59 (05/01 0957) Resp:  [17-23] 17 (05/01 0720) BP: (96-148)/(42-84) 107/48 mmHg (05/01 0957) SpO2:  [90 %-97 %] 94 % (05/01 0720) Weight:  [118.9 kg (262 lb 2 oz)-119.523 kg (263 lb 8 oz)] 118.9 kg (262 lb 2 oz) (05/01 0720) Weight change: -2.921 kg (-6 lb 7.1 oz)  Intake/Output from previous day: 04/30 0701 - 05/01 0700 In: 840 [P.O.:840] Out: 85 [Urine:85] Intake/Output this shift:    General appearance: alert and mild distress  LE edema 2-3+  Lab Results:  Recent Labs  11/16/15 0533 11/16/15 0740  WBC 7.2 6.9  HGB 7.7* 7.6*  HCT 24.2* 24.2*  PLT 132* 123*   BMET:  Recent Labs  11/15/15 0521 11/16/15 0740  NA 136  136 134*  K 4.1  4.2 4.2  CL 97*  99* 95*  CO2 27  24 25   GLUCOSE 100*  94 103*  BUN 59*  61* 71*  CREATININE 5.27*  5.31* 6.67*  CALCIUM 9.4  9.3 9.3   No results for input(s): PTH in the last 72 hours. Iron Studies: No results for input(s): IRON, TIBC, TRANSFERRIN, FERRITIN in the last 72 hours. Studies/Results: No results found.  Scheduled: . allopurinol  200 mg Oral Daily  .  darbepoetin (ARANESP) injection - NON-DIALYSIS  100 mcg Subcutaneous Q Wed-1800  . fluticasone  2 spray Each Nare Daily  . hydroxychloroquine  200 mg Oral BID  . isosorbide mononitrate  30 mg Oral Daily  . multivitamin with minerals  1 tablet Oral Daily  . pantoprazole  40 mg Oral BID  . sevelamer carbonate  800 mg Oral TID WC  . sodium chloride flush  3 mL Intravenous Q12H      LOS: 6 days   Rivkah Wolz C 11/16/2015,10:19 AM

## 2015-11-16 NOTE — Progress Notes (Addendum)
Patient ID: Bruce Cole, male   DOB: 1941-04-21, 75 y.o.   MRN: [name]    PROGRESS NOTE    Bruce Cole  [name] DOB: April 03, 1941 DOA: 11/10/2015  PCP: Nance Pear., NP   Outpatient Specialists:   Brief Narrative:  75 y.o. male with rheumatoid arthritis, chronic atrial fibrillation on warfarin, chronic systolic CHF, and chronic kidney disease stage IV who presented to the ED at [facility] for evaluation of dyspnea and malaise 4 days duration. Patient has been followed closely by outpatient cardiologist and nephrologist and had AV fistula created in the left upper extremity on 08/26/2015, but has not yet required dialysis.  ED Course: Upon arrival to the [facility] ED, Chest x-ray demonstrated a right sided pleural effusion with an associated opacity in the right base. Transferred to [facility] for admission.  Major events: 4/27 - more dyspneic in the afternoon 4 pm, ordered CXR as pt unable to tolerate CT, PCCM consulted, ABG pend, also call nephrology team again to consider HD sooner than expected  Assessment & Plan:   1. GI bleed with symptomatic anemia  - 2 units pRBCs ordered for transfusion on admission  - EGD done 4/28, notable for mild gastritis and per GI may cause heme positive stool in face of over [facility] - otherwise normal EGD - Recommendation is to continue PPI for upper GI mucosal protection  - continue to provide antiemetics and analgesia as needed  2. Chronic atrial fibrillation with supratherapeutic INR, bradycardia this AM - CHADS-VASc 3 (age, HTN, CH) - INR trending down: 5.03 --> 3.8 - continue to hold warfarin until INR returns to goal, 2.95 this am - per cardiology, hold coreg  - cardiology's assistance is appreciated   3. AKI on CKD IV - SCr is 5.93 on admission, up from 3.3 on 10/27/15  - Nephrology is consulting and much appreciated  - AVF in LUE created on 08/26/15 - trialysis catheter placed 4/28 for urgent HD, so far  tolerating HD well but pt says its rough on him  - plan for fistulogram when INR<1.5, likely Wednesday.  4. Chronic systolic CHF/Volume overload  - currently receiving 160 mg IV lasix BID with poor urine output - TTE (08/18/15) with EF 40-45%  - Wt is 265 on admission --> 275 lbs --> 272 lbs --> 262 lbs --> 254 lbs this AM - HD per nephrology   5. Hypertension, essential - currently on Imdur   6. Rheumatoid arthritis  - Stable  - Continue home-dose Plaquenil   7. Right pleural effusion  - CT chest confirms right pleural effusion with extensive passive atelectasis, loculated component of the pleural effusion  - d/w PCCM, can tap when INR lower, so for now on hold   8. Prolonged QTc - QTc 555 ms on admission  - Avoiding agents that would further prolong  - Monitor on telemetry   9. Obesity  - Body mass index is 34.15 kg/(m^2).  10. Thrombocytopenia - mild, reactive, monitor  - CBC in AM  DVT prophylaxis: SCD's for now  Code Status: Full Family Communication: Patient at bedside  Disposition Plan: pending improvement in symptoms   Consultants:   Nephrology  Cardiology   GI  PCCM  Vascular   Procedures:   EGD 4/28 - mild gastritis, continue with PPI  HD cath 4/28   Antimicrobials:  None   Subjective: Reports nausea and abd discomfort, non radiating, no vomiting.   Objective: Filed Vitals:   11/16/15 1057 11/16/15  1230 11/16/15 1250 11/16/15 1320  BP: 121/72 122/76 123/67 115/64  Pulse: 61 66    Temp: 97.8 F (36.6 C) 98 F (36.7 C)    TempSrc: Oral Oral    Resp: 22 19 13 22   Height:      Weight: 115.5 kg (254 lb 10.1 oz)     SpO2: 94% 95%      Intake/Output Summary (Last 24 hours) at 11/16/15 1611 Last data filed at 11/16/15 1057  Gross per 24 hour  Intake    360 ml  Output   3085 ml  Net  -2725 ml   Filed Weights   11/16/15 0400 11/16/15 0720 11/16/15 1057  Weight: 119.523 kg (263 lb 8 oz) 118.9 kg (262 lb 2 oz) 115.5 kg (254 lb  10.1 oz)    Examination: General: Calm, alert, NAD HEENT: normal Neck: Carotids 2+ bilat; no bruits. No lymphadenopathy  Cor: PMI nondisplaced. Irregularly irregular rate & rhythm. No murmurs. Lungs: Dull R > L Abdomen: soft, nontender, non distended. BS present  Extremities: LE edema + 2, LUE AVF  Data Reviewed: I have personally reviewed following labs and imaging studies  CBC:  Recent Labs Lab 11/10/15 1550  11/13/15 1042 11/14/15 0625 11/15/15 0521 11/16/15 0533 11/16/15 0740  WBC 5.9  < > 5.9 6.4 6.9 7.2 6.9  NEUTROABS 4.7  --   --   --   --   --   --   HGB 6.5*  < > 7.8* 7.8* 7.8* 7.7* 7.6*  HCT 20.8*  < > 25.5* 25.5* 25.1* 24.2* 24.2*  MCV 88.1  < > 90.4 89.2 89.6 88.6 88.6  PLT 239  < > 167 138* 133* 132* 123*  < > = values in this interval not displayed. Basic Metabolic Panel:  Recent Labs Lab 11/10/15 2144  11/13/15 0513 11/14/15 0625 11/15/15 0521 11/16/15 0740 11/16/15 1223  NA  --   < > 137 138 136  136 134* 138  K  --   < > 3.8 3.9 4.1  4.2 4.2 3.7  CL  --   < > 94* 96* 97*  99* 95* 99*  CO2  --   < > 28 28 27  24 25 26   GLUCOSE  --   < > 107* 117* 100*  94 103* 86  BUN  --   < > 116* 85* 59*  61* 71* 43*  CREATININE  --   < > 6.53* 6.01* 5.27*  5.31* 6.67* 4.70*  CALCIUM  --   < > 9.3 9.4 9.4  9.3 9.3 9.0  MG 2.6*  --   --   --   --   --   --   PHOS  --   < > 5.9* 5.2* 5.0*  5.1* 5.1* 3.8  < > = values in this interval not displayed. Liver Function Tests:  Recent Labs Lab 11/10/15 1550  11/13/15 0513 11/14/15 0625 11/15/15 0521 11/16/15 0740 11/16/15 1223  AST 29  --   --   --   --   --   --   ALT 14*  --   --   --   --   --   --   ALKPHOS 114  --   --   --   --   --   --   BILITOT 0.7  --   --   --   --   --   --   PROT 7.1  --   --   --   --   --   --  ALBUMIN 4.0  < > 3.7 3.6 3.7  3.5 3.5 3.6  < > = values in this interval not displayed. Coagulation Profile:  Recent Labs Lab 11/12/15 0507 11/13/15 0513  11/14/15 0625 11/15/15 0521 11/16/15 0533  INR 3.80* 3.88* 3.39* 2.95* 2.27*   Cardiac Enzymes:  Recent Labs Lab 11/10/15 1550  TROPONINI 0.10*   Urine analysis:    Component Value Date/Time   COLORURINE YELLOW 11/10/2015 1800   APPEARANCEUR CLEAR 11/10/2015 1800   LABSPEC 1.012 11/10/2015 1800   PHURINE 5.5 11/10/2015 1800   GLUCOSEU NEGATIVE 11/10/2015 1800   GLUCOSEU NEGATIVE 05/05/2015 1135   HGBUR NEGATIVE 11/10/2015 1800   HGBUR negative 09/21/2009 1008   BILIRUBINUR NEGATIVE 11/10/2015 1800   KETONESUR NEGATIVE 11/10/2015 1800   PROTEINUR NEGATIVE 11/10/2015 1800   UROBILINOGEN 0.2 05/05/2015 1135   NITRITE NEGATIVE 11/10/2015 1800   LEUKOCYTESUR NEGATIVE 11/10/2015 1800    Recent Results (from the past 240 hour(s))  MRSA PCR Screening     Status: None   Collection Time: 11/10/15  8:50 PM  Result Value Ref Range Status   MRSA by PCR NEGATIVE NEGATIVE Final    Radiology Studies: Dg Chest Port 1 View 11/10/2015  Right pleural effusion and basilar pulmonary opacity. Cardiomegaly is noted.  Ct Chest Wo Contrast 11/12/2015  Prominent systemic venous structures favoring elevated right heart pressures. Prominent right atrial enlargement and moderate enlargement of the rest of the heart. 2. I am unsure if the low-density posteriorly into the right in the right auricle represents the dentate margin of the right auricle or thrombus. Consider echocardiography with attention to this region. 3. Large right pleural effusion with extensive passive atelectasis. Loculated component of the pleural effusion anteriorly. 4. Prominent airway thickening and near collapse in the right mainstem bronchus, right bronchus intermedius, and in the left lower lobe bronchi. This raises suspicion for the possibility of bronchomalacia. 5. Diffuse subcutaneous edema and mild perisplenic ascites, likely due to diffuse third spacing of fluid. There is also bilateral gynecomastia. 6. Stable aneurysmal  thoracic aorta, measuring up to 4.7 cm along the posterior arch.   Dg Chest Port 1 View 11/12/2015  Right IJ line tip is projects in the lower right internal jugular vein, just above the brachiocephalic confluence. If SVC placement is desired, advanced 3 cm. 2. Stable large right pleural effusion and stable cardiomegaly   Scheduled Meds: . allopurinol  200 mg Oral Daily  . darbepoetin (ARANESP) injection - NON-DIALYSIS  100 mcg Subcutaneous Q Wed-1800  . fluticasone  2 spray Each Nare Daily  . hydroxychloroquine  200 mg Oral BID  . isosorbide mononitrate  30 mg Oral Daily  . multivitamin with minerals  1 tablet Oral Daily  . pantoprazole  40 mg Oral BID  . sevelamer carbonate  800 mg Oral TID WC  . sodium chloride flush  3 mL Intravenous Q12H   Continuous Infusions:    LOS: 6 days   Time spent: 20 minutes   Faye Ramsay, MD [facility] Pager [phone]  If 7PM-7AM, please contact night-coverage [email or web address] Password [email or web address] 11/16/2015, 4:11 PM

## 2015-11-17 LAB — CBC
HEMATOCRIT: 24.6 % — AB (ref 39.0–52.0)
HEMOGLOBIN: 7.6 g/dL — AB (ref 13.0–17.0)
MCH: 27.1 pg (ref 26.0–34.0)
MCHC: 30.9 g/dL (ref 30.0–36.0)
MCV: 87.9 fL (ref 78.0–100.0)
Platelets: 92 10*3/uL — ABNORMAL LOW (ref 150–400)
RBC: 2.8 MIL/uL — AB (ref 4.22–5.81)
RDW: 17.4 % — ABNORMAL HIGH (ref 11.5–15.5)
WBC: 6.5 10*3/uL (ref 4.0–10.5)

## 2015-11-17 LAB — RENAL FUNCTION PANEL
ANION GAP: 13 (ref 5–15)
Albumin: 3.5 g/dL (ref 3.5–5.0)
BUN: 52 mg/dL — ABNORMAL HIGH (ref 6–20)
CALCIUM: 9.3 mg/dL (ref 8.9–10.3)
CO2: 24 mmol/L (ref 22–32)
Chloride: 99 mmol/L — ABNORMAL LOW (ref 101–111)
Creatinine, Ser: 6.03 mg/dL — ABNORMAL HIGH (ref 0.61–1.24)
GFR calc non Af Amer: 8 mL/min — ABNORMAL LOW (ref >60)
GFR, EST AFRICAN AMERICAN: 10 mL/min — AB (ref >60)
GLUCOSE: 93 mg/dL (ref 65–99)
POTASSIUM: 4.2 mmol/L (ref 3.5–5.1)
Phosphorus: 4.5 mg/dL (ref 2.5–4.6)
SODIUM: 136 mmol/L (ref 135–145)

## 2015-11-17 LAB — PROTIME-INR
INR: 1.95 — ABNORMAL HIGH (ref 0.00–1.49)
Prothrombin Time: 22.2 seconds — ABNORMAL HIGH (ref 11.6–15.2)

## 2015-11-17 MED ORDER — LIDOCAINE-PRILOCAINE 2.5-2.5 % EX CREA
1.0000 "application " | TOPICAL_CREAM | CUTANEOUS | Status: DC | PRN
  Filled 2015-11-17: qty 5

## 2015-11-17 MED ORDER — SODIUM CHLORIDE 0.9 % IV SOLN: 100.0000 mL | INTRAVENOUS | Status: DC | PRN

## 2015-11-17 MED ORDER — HEPARIN SODIUM (PORCINE) 1000 UNIT/ML DIALYSIS
20.0000 [IU]/kg | INTRAMUSCULAR | Status: DC | PRN
  Filled 2015-11-17: qty 3

## 2015-11-17 MED ORDER — ALTEPLASE 2 MG IJ SOLR: 2.0000 mg | Freq: Once | INTRAMUSCULAR | Status: DC | PRN

## 2015-11-17 MED ORDER — LIDOCAINE HCL (PF) 1 % IJ SOLN: 5.0000 mL | INTRAMUSCULAR | Status: DC | PRN

## 2015-11-17 MED ORDER — HEPARIN SODIUM (PORCINE) 1000 UNIT/ML DIALYSIS
1000.0000 [IU] | INTRAMUSCULAR | Status: DC | PRN
  Filled 2015-11-17: qty 1

## 2015-11-17 MED ORDER — PENTAFLUOROPROP-TETRAFLUOROETH EX AERO: 1.0000 "application " | INHALATION_SPRAY | CUTANEOUS | Status: DC | PRN

## 2015-11-17 NOTE — Procedures (Signed)
Tolerating HD.  Attempting 4 liters off with treatment today. He remains overloaded and with obvious dyspnea.  There is LE edema 1-2+. Will do daily dialysis.  I suspect he will need a thoracentesis. Bruce Cole C

## 2015-11-17 NOTE — Progress Notes (Signed)
Patient ID: Bruce Cole, male   DOB: 1941-01-08, 75 y.o.   MRN: [name]    PROGRESS NOTE    Bruce Cole  [name] DOB: 19-Jul-1940 DOA: 11/10/2015  PCP: Nance Pear., NP   Outpatient Specialists:   Brief Narrative:  75 y.o. male with rheumatoid arthritis, chronic atrial fibrillation on warfarin, chronic systolic CHF, and chronic kidney disease stage IV who presented to the ED at [facility] for evaluation of dyspnea and malaise 4 days duration. Patient has been followed closely by outpatient cardiologist and nephrologist and had AV fistula created in the left upper extremity on 08/26/2015, but has not yet required dialysis.  ED Course: Upon arrival to the [facility] ED, Chest x-ray demonstrated a right sided pleural effusion with an associated opacity in the right base. Transferred to [facility] for admission.  Major events: 4/27 - more dyspneic in the afternoon 4 pm, ordered CXR as pt unable to tolerate CT, PCCM consulted, ABG pend, also call nephrology team again to consider HD sooner than expected  Assessment & Plan:   1. GI bleed with symptomatic anemia  - 2 units pRBCs ordered for transfusion on admission  - EGD done 4/28, notable for mild gastritis and per GI may cause heme positive stool in face of over [facility] - otherwise normal EGD - Recommendation is to continue PPI for upper GI mucosal protection  - continue to provide antiemetics and analgesia as needed  2. Chronic atrial fibrillation with supratherapeutic INR, bradycardia this AM - CHADS-VASc 3 (age, HTN, CH) - INR trending down: 5.03 --> 3.8 - continue to hold warfarin until INR returns to goal, 2.95 this am - per cardiology, hold coreg  - cardiology's assistance is appreciated   3. AKI on CKD IV - SCr is 5.93 on admission, up from 3.3 on 10/27/15  - Nephrology is consulting and much appreciated  - AVF in LUE created on 08/26/15 - plan for fistulogram when INR<1.5, likely Wednesday.  INR today. 1.95  4. Chronic systolic CHF/Volume overload  - currently receiving 160 mg IV lasix BID with poor urine output - TTE (08/18/15) with EF 40-45%  - Wt is 265 on admission --> 275 lbs --> 272 lbs --> 262 lbs --> 254 lbs -- 250 lbs this AM - HD per nephrology  - pt still volume overloaded   5. Hypertension, essential - currently on Imdur   6. Rheumatoid arthritis  - Stable  - Continue home-dose Plaquenil   7. Right pleural effusion  - CT chest confirms right pleural effusion with extensive passive atelectasis, loculated component of the pleural effusion  - d/w PCCM, can tap when INR lower, so for now on hold  - [name]5 this AM  8. Prolonged QTc - QTc 555 ms on admission  - Avoiding agents that would further prolong  - Monitor on telemetry   9. Obesity  - Body mass index is 34.15 kg/(m^2).  10. Thrombocytopenia - persistent drop, no signs of bleeding  - CBC in AM  DVT prophylaxis: SCD's for now  Code Status: Full Family Communication: Patient at bedside  Disposition Plan: pending improvement in symptoms   Consultants:   Nephrology  Cardiology   GI  PCCM  Vascular   Procedures:   EGD 4/28 - mild gastritis, continue with PPI  HD cath 4/28   Antimicrobials:  None   Subjective: Reports feeling better this AM.   Objective: Filed Vitals:   11/17/15 1000 11/17/15 1030 11/17/15 1100 11/17/15 1503  BP: 116/61 123/67 114/68 103/58  Pulse: 65 61 62 62  Temp:   97.2 F (36.2 C) 98 F (36.7 C)  TempSrc:   Oral Oral  Resp:   21   Height:      Weight:   113.4 kg (250 lb)   SpO2:   100% 95%    Intake/Output Summary (Last 24 hours) at 11/17/15 1630 Last data filed at 11/17/15 1100  Gross per 24 hour  Intake    240 ml  Output   4100 ml  Net  -3860 ml   Filed Weights   11/17/15 0403 11/17/15 0700 11/17/15 1100  Weight: 117.028 kg (258 lb) 117.3 kg (258 lb 9.6 oz) 113.4 kg (250 lb)    Examination: General: Calm, alert, NAD HEENT:  normal Neck: Carotids 2+ bilat; no bruits. No lymphadenopathy  Cor: PMI nondisplaced. Irregularly irregular rate & rhythm. No murmurs. Lungs: Dull R > L Abdomen: soft, nontender, non distended. BS present  Extremities: LE edema + 2, LUE AVF  Data Reviewed: I have personally reviewed following labs and imaging studies  CBC:  Recent Labs Lab 11/14/15 0625 11/15/15 0521 11/16/15 0533 11/16/15 0740 11/17/15 0508  WBC 6.4 6.9 7.2 6.9 6.5  HGB 7.8* 7.8* 7.7* 7.6* 7.6*  HCT 25.5* 25.1* 24.2* 24.2* 24.6*  MCV 89.2 89.6 88.6 88.6 87.9  PLT 138* 133* 132* 123* 92*   Basic Metabolic Panel:  Recent Labs Lab 11/10/15 2144  11/14/15 0625 11/15/15 0521 11/16/15 0740 11/16/15 1223 11/17/15 0508  NA  --   < > 138 136  136 134* 138 136  K  --   < > 3.9 4.1  4.2 4.2 3.7 4.2  CL  --   < > 96* 97*  99* 95* 99* 99*  CO2  --   < > 28 27  24 25 26 24   GLUCOSE  --   < > 117* 100*  94 103* 86 93  BUN  --   < > 85* 59*  61* 71* 43* 52*  CREATININE  --   < > 6.01* 5.27*  5.31* 6.67* 4.70* 6.03*  CALCIUM  --   < > 9.4 9.4  9.3 9.3 9.0 9.3  MG 2.6*  --   --   --   --   --   --   PHOS  --   < > 5.2* 5.0*  5.1* 5.1* 3.8 4.5  < > = values in this interval not displayed. Liver Function Tests:  Recent Labs Lab 11/14/15 0625 11/15/15 0521 11/16/15 0740 11/16/15 1223 11/17/15 0508  ALBUMIN 3.6 3.7  3.5 3.5 3.6 3.5   Coagulation Profile:  Recent Labs Lab 11/13/15 0513 11/14/15 0625 11/15/15 0521 11/16/15 0533 11/17/15 0508  INR 3.88* 3.39* 2.95* 2.27* 1.95*   Urine analysis:    Component Value Date/Time   COLORURINE YELLOW 11/10/2015 1800   APPEARANCEUR CLEAR 11/10/2015 1800   LABSPEC 1.012 11/10/2015 1800   PHURINE 5.5 11/10/2015 1800   GLUCOSEU NEGATIVE 11/10/2015 1800   GLUCOSEU NEGATIVE 05/05/2015 1135   HGBUR NEGATIVE 11/10/2015 1800   HGBUR negative 09/21/2009 1008   BILIRUBINUR NEGATIVE 11/10/2015 1800   KETONESUR NEGATIVE 11/10/2015 1800   PROTEINUR  NEGATIVE 11/10/2015 1800   UROBILINOGEN 0.2 05/05/2015 1135   NITRITE NEGATIVE 11/10/2015 1800   LEUKOCYTESUR NEGATIVE 11/10/2015 1800    Recent Results (from the past 240 hour(s))  MRSA PCR Screening     Status: None   Collection Time: 11/10/15  8:50 PM  Result Value Ref Range Status   MRSA by PCR NEGATIVE NEGATIVE Final    Radiology Studies: Dg Chest Port 1 View 11/10/2015  Right pleural effusion and basilar pulmonary opacity. Cardiomegaly is noted.  Ct Chest Wo Contrast 11/12/2015  Prominent systemic venous structures favoring elevated right heart pressures. Prominent right atrial enlargement and moderate enlargement of the rest of the heart. 2. I am unsure if the low-density posteriorly into the right in the right auricle represents the dentate margin of the right auricle or thrombus. Consider echocardiography with attention to this region. 3. Large right pleural effusion with extensive passive atelectasis. Loculated component of the pleural effusion anteriorly. 4. Prominent airway thickening and near collapse in the right mainstem bronchus, right bronchus intermedius, and in the left lower lobe bronchi. This raises suspicion for the possibility of bronchomalacia. 5. Diffuse subcutaneous edema and mild perisplenic ascites, likely due to diffuse third spacing of fluid. There is also bilateral gynecomastia. 6. Stable aneurysmal thoracic aorta, measuring up to 4.7 cm along the posterior arch.   Dg Chest Port 1 View 11/12/2015  Right IJ line tip is projects in the lower right internal jugular vein, just above the brachiocephalic confluence. If SVC placement is desired, advanced 3 cm. 2. Stable large right pleural effusion and stable cardiomegaly   Scheduled Meds: . allopurinol  200 mg Oral Daily  . darbepoetin (ARANESP) injection - NON-DIALYSIS  100 mcg Subcutaneous Q Wed-1800  . fluticasone  2 spray Each Nare Daily  . hydroxychloroquine  200 mg Oral BID  . isosorbide mononitrate  30 mg Oral  Daily  . multivitamin with minerals  1 tablet Oral Daily  . pantoprazole  40 mg Oral BID  . sevelamer carbonate  800 mg Oral TID WC  . sodium chloride flush  3 mL Intravenous Q12H   Continuous Infusions:    LOS: 7 days   Time spent: 20 minutes   Faye Ramsay, MD [facility] Pager [phone]  If 7PM-7AM, please contact night-coverage [email or web address] Password [email or web address] 11/17/2015, 4:30 PM

## 2015-11-17 NOTE — Consult Note (Signed)
   [facility] CM Inpatient Consult   11/17/2015  ANTWIAN COLMENARES Oct 23, 1940 [name]  Spoke with inpatient RNCM regarding [place] Management following.  Patient was active.  Community nurse requested that elastic hose to be considered for home use when discharged.  [facility] will continue to follow for community care management post hospitalization.  Patient resting in room.  For questions, please contact:  Natividad Brood, RN BSN [facility] Liaison  [phone] business mobile phone Toll free office [phone]

## 2015-11-17 NOTE — Progress Notes (Signed)
Pt has refused CPAP for the night and even requested RT to remove machine from room.  RT to monitor and assess as needed.

## 2015-11-18 ENCOUNTER — Inpatient Hospital Stay (HOSPITAL_COMMUNITY): Payer: Medicare Other

## 2015-11-18 LAB — PROTIME-INR
INR: 1.7 — AB (ref 0.00–1.49)
PROTHROMBIN TIME: 20 s — AB (ref 11.6–15.2)

## 2015-11-18 LAB — CBC
HCT: 24.5 % — ABNORMAL LOW (ref 39.0–52.0)
HEMATOCRIT: 24.6 % — AB (ref 39.0–52.0)
Hemoglobin: 7.5 g/dL — ABNORMAL LOW (ref 13.0–17.0)
Hemoglobin: 7.5 g/dL — ABNORMAL LOW (ref 13.0–17.0)
MCH: 27.6 pg (ref 26.0–34.0)
MCH: 26.8 pg (ref 26.0–34.0)
MCHC: 30.6 g/dL (ref 30.0–36.0)
MCHC: 30.5 g/dL (ref 30.0–36.0)
MCV: 90.1 fL (ref 78.0–100.0)
MCV: 87.9 fL (ref 78.0–100.0)
PLATELETS: 101 10*3/uL — AB (ref 150–400)
Platelets: 79 10*3/uL — ABNORMAL LOW (ref 150–400)
RBC: 2.72 MIL/uL — ABNORMAL LOW (ref 4.22–5.81)
RBC: 2.8 MIL/uL — AB (ref 4.22–5.81)
RDW: 17.5 % — AB (ref 11.5–15.5)
RDW: 17.5 % — ABNORMAL HIGH (ref 11.5–15.5)
WBC: 7.6 10*3/uL (ref 4.0–10.5)
WBC: 7.8 10*3/uL (ref 4.0–10.5)

## 2015-11-18 LAB — BASIC METABOLIC PANEL
Anion gap: 13 (ref 5–15)
BUN: 38 mg/dL — AB (ref 6–20)
CALCIUM: 9.1 mg/dL (ref 8.9–10.3)
CO2: 26 mmol/L (ref 22–32)
Chloride: 98 mmol/L — ABNORMAL LOW (ref 101–111)
Creatinine, Ser: 5.54 mg/dL — ABNORMAL HIGH (ref 0.61–1.24)
GFR calc Af Amer: 11 mL/min — ABNORMAL LOW (ref >60)
GFR, EST NON AFRICAN AMERICAN: 9 mL/min — AB (ref >60)
GLUCOSE: 105 mg/dL — AB (ref 65–99)
Potassium: 3.7 mmol/L (ref 3.5–5.1)
Sodium: 137 mmol/L (ref 135–145)

## 2015-11-18 LAB — RENAL FUNCTION PANEL
Albumin: 3.5 g/dL (ref 3.5–5.0)
Albumin: 3.4 g/dL — ABNORMAL LOW (ref 3.5–5.0)
Anion gap: 12 (ref 5–15)
Anion gap: 11 (ref 5–15)
BUN: 38 mg/dL — ABNORMAL HIGH (ref 6–20)
BUN: 28 mg/dL — AB (ref 6–20)
CHLORIDE: 99 mmol/L — AB (ref 101–111)
CO2: 26 mmol/L (ref 22–32)
CO2: 27 mmol/L (ref 22–32)
Calcium: 9.1 mg/dL (ref 8.9–10.3)
Calcium: 8.9 mg/dL (ref 8.9–10.3)
Chloride: 98 mmol/L — ABNORMAL LOW (ref 101–111)
Creatinine, Ser: 5.48 mg/dL — ABNORMAL HIGH (ref 0.61–1.24)
Creatinine, Ser: 4.91 mg/dL — ABNORMAL HIGH (ref 0.61–1.24)
GFR calc Af Amer: 11 mL/min — ABNORMAL LOW (ref >60)
GFR calc Af Amer: 12 mL/min — ABNORMAL LOW (ref >60)
GFR calc non Af Amer: 9 mL/min — ABNORMAL LOW (ref >60)
GFR, EST NON AFRICAN AMERICAN: 11 mL/min — AB (ref >60)
Glucose, Bld: 104 mg/dL — ABNORMAL HIGH (ref 65–99)
Glucose, Bld: 114 mg/dL — ABNORMAL HIGH (ref 65–99)
POTASSIUM: 4 mmol/L (ref 3.5–5.1)
Phosphorus: 3.6 mg/dL (ref 2.5–4.6)
Phosphorus: 3.3 mg/dL (ref 2.5–4.6)
Potassium: 3.7 mmol/L (ref 3.5–5.1)
Sodium: 136 mmol/L (ref 135–145)
Sodium: 137 mmol/L (ref 135–145)

## 2015-11-18 MED ORDER — LIDOCAINE HCL (PF) 1 % IJ SOLN: 5.0000 mL | INTRAMUSCULAR | Status: DC | PRN

## 2015-11-18 MED ORDER — HEPARIN SODIUM (PORCINE) 1000 UNIT/ML DIALYSIS
20.0000 [IU]/kg | INTRAMUSCULAR | Status: DC | PRN
  Filled 2015-11-18: qty 3

## 2015-11-18 MED ORDER — HEPARIN SODIUM (PORCINE) 1000 UNIT/ML DIALYSIS
1000.0000 [IU] | INTRAMUSCULAR | Status: DC | PRN
  Filled 2015-11-18: qty 1

## 2015-11-18 MED ORDER — LIDOCAINE-PRILOCAINE 2.5-2.5 % EX CREA
1.0000 "application " | TOPICAL_CREAM | CUTANEOUS | Status: DC | PRN
  Filled 2015-11-18: qty 5

## 2015-11-18 MED ORDER — ALTEPLASE 2 MG IJ SOLR: 2.0000 mg | Freq: Once | INTRAMUSCULAR | Status: DC | PRN

## 2015-11-18 MED ORDER — PENTAFLUOROPROP-TETRAFLUOROETH EX AERO: 1.0000 "application " | INHALATION_SPRAY | CUTANEOUS | Status: DC | PRN

## 2015-11-18 MED ORDER — SODIUM CHLORIDE 0.9 % IV SOLN: 100.0000 mL | INTRAVENOUS | Status: DC | PRN

## 2015-11-18 MED ORDER — DARBEPOETIN ALFA 100 MCG/0.5ML IJ SOSY
PREFILLED_SYRINGE | INTRAMUSCULAR | Status: AC
  Filled 2015-11-18: qty 0.5

## 2015-11-18 NOTE — Progress Notes (Signed)
Pt refusing  Chest xray until he talks to MD, Dr Doyle Askew notified.  Edward Qualia RN

## 2015-11-18 NOTE — Progress Notes (Signed)
    Subjective  -  Many questions about breathing difficulty and dialysis access paln   Physical Exam:  After volume removal, fistula is more prominent Bilateral LE edema Mildly labored breathing, especially when prone       Assessment/Plan:    Patient see in conjunction with Dr. [name].  Fistulogram cancelled due to INR.  Since fistula is easier to feel since volume has been removed, we will try to use it with his next dialysis session today.  If it has difficulty, we will proceed with fistulogram tomorrow.  Based on these results, further recommendations will be made.  If his fistula is not adequate he will need revision, and at that time, the temporary catheter will be converted to a permanent catheter.  Annamarie Major 11/18/2015 9:03 PM --  Filed Vitals:   11/18/15 1829 11/18/15 2012  BP: 102/61 90/46  Pulse: 60 57  Temp: 97.9 F (36.6 C) 98.3 F (36.8 C)  Resp: 22 25    Intake/Output Summary (Last 24 hours) at 11/18/15 2103 Last data filed at 11/18/15 1829  Gross per 24 hour  Intake    360 ml  Output   3141 ml  Net  -2781 ml     Laboratory CBC    Component Value Date/Time   WBC 7.6 11/18/2015 0410   HGB 7.5* 11/18/2015 0410   HCT 24.5* 11/18/2015 0410   PLT 101* 11/18/2015 0410    BMET    Component Value Date/Time   NA 136 11/18/2015 0410   NA 137 11/18/2015 0410   K 3.7 11/18/2015 0410   K 3.7 11/18/2015 0410   CL 98* 11/18/2015 0410   CL 98* 11/18/2015 0410   CO2 26 11/18/2015 0410   CO2 26 11/18/2015 0410   GLUCOSE 104* 11/18/2015 0410   GLUCOSE 105* 11/18/2015 0410   BUN 38* 11/18/2015 0410   BUN 38* 11/18/2015 0410   CREATININE 5.48* 11/18/2015 0410   CREATININE 5.54* 11/18/2015 0410   CREATININE 1.77* 02/13/2013 1505   CALCIUM 9.1 11/18/2015 0410   CALCIUM 9.1 11/18/2015 0410   GFRNONAA 9* 11/18/2015 0410   GFRNONAA 9* 11/18/2015 0410   GFRAA 11* 11/18/2015 0410   GFRAA 11* 11/18/2015 0410    COAG Lab Results  Component  Value Date   INR 1.70* 11/18/2015   [name]5* 11/17/2015   INR 2.27* 11/16/2015   No results found for: PTT  Antibiotics Anti-infectives    Start     Dose/Rate Route Frequency Ordered Stop   11/10/15 2200  hydroxychloroquine (PLAQUENIL) tablet 200 mg     200 mg Oral 2 times daily 11/10/15 2134         V. Leia Alf, M.D. [facility] Office: [phone] Pager:  [phone]

## 2015-11-18 NOTE — Progress Notes (Signed)
Patient refused to sign consent for surgery. States he is not signing  it until he talks to the surgeon this morning. [facility] [employer]

## 2015-11-18 NOTE — Care Management Important Message (Signed)
Important Message  Patient Details  Name: Bruce Cole MRN: [id number] Date of Birth: [birth year]   Medicare Important Message Given:  Yes    [name] May 11/18/2015, 12:04 PM

## 2015-11-18 NOTE — Progress Notes (Signed)
Assessment/Plan:  1. ESRD, new start Plan for HD again today for volume  2. S/p ABLA- due to gastritis in setting of anticoagulation. Transfused PRBC's, aranesp started on 11/11/15. . 3. CHF/Volume overload- cont with UF with HD. 4. Vascular access- TBA has acute HD cath and AVF ths has narrowing 5. A fib on coumadin-  Coumadin on hold for GIB and pending VVS intervention..  Plan Will try cannulation of AVF per Dr. Trula Slade.  If not usable pt agrees to [facility] and fistulogram.          HD today  Subjective: Interval History: Dr. Trula Slade and I discussed access issues with patient  Objective: Vital signs in last 24 hours: Temp:  [98 F (36.7 C)-99.5 F (37.5 C)] 98.8 F (37.1 C) (05/03 1025) Pulse Rate:  [59-62] 59 (05/03 1025) Resp:  [13] 13 (05/02 2240) BP: (100-111)/(50-67) 110/62 mmHg (05/03 1025) SpO2:  [95 %-100 %] 98 % (05/03 1025) Weight:  [113.445 kg (250 lb 1.6 oz)] 113.445 kg (250 lb 1.6 oz) (05/03 0422) Weight change: -5.5 kg (-12 lb 2 oz)  Intake/Output from previous day: 05/02 0701 - 05/03 0700 In: 240 [P.O.:240] Out: 4050 [Urine:50] Intake/Output this shift:    General appearance: alert and cooperative Resp: diminished breath sounds bibasilar Cardio: regular rate and rhythm, S1, S2 normal, no murmur, click, rub or gallop Extremities: 2-#= BLE edema, LUE AV fistula with some narrowing proximally  Lab Results:  Recent Labs  11/17/15 0508 11/18/15 0410  WBC 6.5 7.6  HGB 7.6* 7.5*  HCT 24.6* 24.5*  PLT 92* 101*   BMET:  Recent Labs  11/17/15 0508 11/18/15 0410  NA 136 136  137  K 4.2 3.7  3.7  CL 99* 98*  98*  CO2 24 26  26   GLUCOSE 93 104*  105*  BUN 52* 38*  38*  CREATININE 6.03* 5.48*  5.54*  CALCIUM 9.3 9.1  9.1   No results for input(s): PTH in the last 72 hours. Iron Studies: No results for input(s): IRON, TIBC, TRANSFERRIN, FERRITIN in the last 72 hours. Studies/Results: No results found.  Scheduled: . allopurinol   200 mg Oral Daily  . darbepoetin (ARANESP) injection - NON-DIALYSIS  100 mcg Subcutaneous Q Wed-1800  . fluticasone  2 spray Each Nare Daily  . hydroxychloroquine  200 mg Oral BID  . isosorbide mononitrate  30 mg Oral Daily  . multivitamin with minerals  1 tablet Oral Daily  . pantoprazole  40 mg Oral BID  . sevelamer carbonate  800 mg Oral TID WC  . sodium chloride flush  3 mL Intravenous Q12H     LOS: 8 days   Adalynn Corne C 11/18/2015,1:07 PM

## 2015-11-18 NOTE — Progress Notes (Signed)
Pt refusing cardiac monitoring, has taken all leads off, demanding IV and IJ removed, refusing meds, states he wants to leave AMA, has called his daughter to pick him up, states he doesn't understand his plan of care or the reason for placement of a fistula, wants to see his surgeon or go to another hospital, will continue to monitor closely,  Edward Qualia RN

## 2015-11-18 NOTE — Progress Notes (Signed)
Patient ID: Bruce Cole, male   DOB: 1940/10/07, 75 y.o.   MRN: [id number]    PROGRESS NOTE    Bruce Cole  [id number] DOB: [birth year] DOA: 11/10/2015  PCP: Nance Pear., NP   Outpatient Specialists:   Brief Narrative:  75 y.o. male with rheumatoid arthritis, chronic atrial fibrillation on warfarin, chronic systolic CHF, and chronic kidney disease stage IV who presented to the ED at [facility] for evaluation of dyspnea and malaise 4 days duration. Patient has been followed closely by outpatient cardiologist and nephrologist and had AV fistula created in the left upper extremity on 08/26/2015, but has not yet required dialysis.  ED Course: Upon arrival to the [facility] ED, Chest x-ray demonstrated a right sided pleural effusion with an associated opacity in the right base. Transferred to [facility] for admission.  Major events: 4/27 - more dyspneic in the afternoon 4 pm, ordered CXR as pt unable to tolerate CT, PCCM consulted, ABG pend, also call nephrology team again to consider HD sooner than expected  Assessment & Plan:   1. GI bleed with symptomatic anemia  - 2 units pRBCs ordered for transfusion on admission  - EGD done 4/28, notable for mild gastritis and per GI may cause heme positive stool in face of over [facility] - otherwise normal EGD - Recommendation is to continue PPI for upper GI mucosal protection  - continue to provide antiemetics and analgesia as needed  2. Chronic atrial fibrillation with supratherapeutic INR, bradycardia this AM - CHADS-VASc 3 (age, HTN, CH) - INR trending down: 5.03 --> 3.8 --> 1.70 - per cardiology, still holding coreg  - pt is on Imdur only for now  - cardiology's assistance is appreciated   3. AKI on CKD IV --> ESRD now on HD - SCr is 5.93 on admission, up from 3.3 on 10/27/15  - Nephrology team following  - AVF in LUE created on 08/26/15 - plan for fistulogram when INR<1.5, possibly in AM, INR 1.70 - needs  continued HD sessions, weight is down overall but pt is still volume overloaded   4. Chronic systolic CHF/Volume overload  - currently receiving 160 mg IV lasix BID with poor urine output - TTE (08/18/15) with EF 40-45%  - Wt is 265 on admission --> 275 lbs --> 272 lbs --> 262 lbs --> 254 lbs -- 250 lbs --> 250 lbs this AM - HD per nephrology  - pt still volume overloaded but better overall   5. Hypertension, essential - currently on Imdur and BP has been reasonably stable   6. Rheumatoid arthritis  - Stable  - Continue home-dose Plaquenil   7. Right pleural effusion  - CT chest confirms right pleural effusion with extensive passive atelectasis, loculated component of the pleural effusion  - d/w PCCM, can tap when INR lower, so for now on hold  - INR 1.70 this AM - plan for repeat CXR this AM to re evaluate pleural effusion  - this was explained to the pt this AM - he wants to talk to Dr. [name] first to discuss further testing   8. Prolonged QTc - QTc 555 ms on admission  - Avoiding agents that would further prolong  - Monitor on telemetry   9. Obesity  - Body mass index is 34.15 kg/(m^2).  10. Thrombocytopenia - no signs of bleeding  - slightly better this AM - CBC in AM  DVT prophylaxis: SCD's for now  Code Status: Full Family Communication: Patient at  bedside  Disposition Plan: pending improvement in symptoms   Consultants:   Nephrology  Cardiology   GI  PCCM  Vascular   Procedures:   EGD 4/28 - mild gastritis, continue with PPI  HD cath 4/28   Antimicrobials:  None   Subjective: Reports feeling better this AM but upset as he is not sure where he is heading with HD.   Objective: Filed Vitals:   11/17/15 2240 11/18/15 0000 11/18/15 0422 11/18/15 1025  BP: 100/56 103/50 111/67 110/62  Pulse:  59 59 59  Temp:  98.2 F (36.8 C) 99.5 F (37.5 C) 98.8 F (37.1 C)  TempSrc:  Oral Oral Oral  Resp: 13     Height:      Weight:   113.445 kg  (250 lb 1.6 oz)   SpO2:  100% 97% 98%    Intake/Output Summary (Last 24 hours) at 11/18/15 1218 Last data filed at 11/17/15 2200  Gross per 24 hour  Intake    240 ml  Output     50 ml  Net    190 ml   Filed Weights   11/17/15 0700 11/17/15 1100 11/18/15 0422  Weight: 117.3 kg (258 lb 9.6 oz) 113.4 kg (250 lb) 113.445 kg (250 lb 1.6 oz)    Examination: General: Calm, alert, NAD HEENT: normal Neck: Carotids 2+ bilat; no bruits. No lymphadenopathy  Cor: PMI nondisplaced. Irregularly irregular rate & rhythm. Mild brady this AM. No murmurs. Lungs: Dull R > L Abdomen: soft, nontender, non distended. BS present  Extremities: LE edema + 1-2, LUE AVF  Data Reviewed: I have personally reviewed following labs and imaging studies  CBC:  Recent Labs Lab 11/15/15 0521 11/16/15 0533 11/16/15 0740 11/17/15 0508 11/18/15 0410  WBC 6.9 7.2 6.9 6.5 7.6  HGB 7.8* 7.7* 7.6* 7.6* 7.5*  HCT 25.1* 24.2* 24.2* 24.6* 24.5*  MCV 89.6 88.6 88.6 87.9 90.1  PLT 133* 132* 123* 92* [id number]*   Basic Metabolic Panel:  Recent Labs Lab 11/15/15 0521 11/16/15 0740 11/16/15 1223 11/17/15 0508 11/18/15 0410  NA 136  136 134* 138 136 136  137  K 4.1  4.2 4.2 3.7 4.2 3.7  3.7  CL 97*  99* 95* 99* 99* 98*  98*  CO2 27  24 25 26 24 26  26   GLUCOSE 100*  94 103* 86 93 104*  105*  BUN 59*  61* 71* 43* 52* 38*  38*  CREATININE 5.27*  5.31* 6.67* 4.70* 6.03* 5.48*  5.54*  CALCIUM 9.4  9.3 9.3 9.0 9.3 9.1  9.1  PHOS 5.0*  5.1* 5.1* 3.8 4.5 3.6   Liver Function Tests:  Recent Labs Lab 11/15/15 0521 11/16/15 0740 11/16/15 1223 11/17/15 0508 11/18/15 0410  ALBUMIN 3.7  3.5 3.5 3.6 3.5 3.5   Coagulation Profile:  Recent Labs Lab 11/14/15 0625 11/15/15 0521 11/16/15 0533 11/17/15 0508 11/18/15 0410  INR 3.39* 2.95* 2.27* 1.95* 1.70*   Urine analysis:    Component Value Date/Time   COLORURINE YELLOW 11/10/2015 1800   APPEARANCEUR CLEAR 11/10/2015 1800   LABSPEC 1.012  11/10/2015 1800   PHURINE 5.5 11/10/2015 1800   GLUCOSEU NEGATIVE 11/10/2015 1800   GLUCOSEU NEGATIVE 05/05/2015 1135   HGBUR NEGATIVE 11/10/2015 1800   HGBUR negative 09/21/2009 1008   BILIRUBINUR NEGATIVE 11/10/2015 1800   KETONESUR NEGATIVE 11/10/2015 1800   PROTEINUR NEGATIVE 11/10/2015 1800   UROBILINOGEN 0.2 05/05/2015 1135   NITRITE NEGATIVE 11/10/2015 1800   LEUKOCYTESUR NEGATIVE 11/10/2015 1800  Recent Results (from the past 240 hour(s))  MRSA PCR Screening     Status: None   Collection Time: 11/10/15  8:50 PM  Result Value Ref Range Status   MRSA by PCR NEGATIVE NEGATIVE Final    Radiology Studies: Dg Chest Port 1 View 11/10/2015  Right pleural effusion and basilar pulmonary opacity. Cardiomegaly is noted.  Ct Chest Wo Contrast 11/12/2015  Prominent systemic venous structures favoring elevated right heart pressures. Prominent right atrial enlargement and moderate enlargement of the rest of the heart. 2. I am unsure if the low-density posteriorly into the right in the right auricle represents the dentate margin of the right auricle or thrombus. Consider echocardiography with attention to this region. 3. Large right pleural effusion with extensive passive atelectasis. Loculated component of the pleural effusion anteriorly. 4. Prominent airway thickening and near collapse in the right mainstem bronchus, right bronchus intermedius, and in the left lower lobe bronchi. This raises suspicion for the possibility of bronchomalacia. 5. Diffuse subcutaneous edema and mild perisplenic ascites, likely due to diffuse third spacing of fluid. There is also bilateral gynecomastia. 6. Stable aneurysmal thoracic aorta, measuring up to 4.7 cm along the posterior arch.   Dg Chest Port 1 View 11/12/2015  Right IJ line tip is projects in the lower right internal jugular vein, just above the brachiocephalic confluence. If SVC placement is desired, advanced 3 cm. 2. Stable large right pleural effusion  and stable cardiomegaly   Scheduled Meds: . allopurinol  200 mg Oral Daily  . darbepoetin (ARANESP) injection - NON-DIALYSIS  100 mcg Subcutaneous Q Wed-1800  . fluticasone  2 spray Each Nare Daily  . hydroxychloroquine  200 mg Oral BID  . isosorbide mononitrate  30 mg Oral Daily  . multivitamin with minerals  1 tablet Oral Daily  . pantoprazole  40 mg Oral BID  . sevelamer carbonate  800 mg Oral TID WC  . sodium chloride flush  3 mL Intravenous Q12H   Continuous Infusions:    LOS: 8 days   Time spent: 20 minutes   Faye Ramsay, MD [facility] Pager [phone]  If 7PM-7AM, please contact night-coverage [email or web address] Password [email or web address] 11/18/2015, 12:18 PM

## 2015-11-18 NOTE — Progress Notes (Signed)
Patient refused to have bed alarm on despite education regarding the fall prevention/safety plan. [facility] [employer]

## 2015-11-19 ENCOUNTER — Encounter (HOSPITAL_COMMUNITY): Payer: Self-pay | Admitting: Vascular Surgery

## 2015-11-19 ENCOUNTER — Encounter (HOSPITAL_COMMUNITY)
Admission: EM | Disposition: A | Discharge status: Skilled Nursing Facility | Payer: Self-pay | Source: Home / Self Care | Attending: Internal Medicine

## 2015-11-19 DIAGNOSIS — N186 End stage renal disease: Secondary | ICD-10-CM

## 2015-11-19 DIAGNOSIS — T82898A Other specified complication of vascular prosthetic devices, implants and grafts, initial encounter: Secondary | ICD-10-CM

## 2015-11-19 HISTORY — PX: PERIPHERAL VASCULAR CATHETERIZATION: SHX172C

## 2015-11-19 LAB — CBC
HCT: 24.3 % — ABNORMAL LOW (ref 39.0–52.0)
HCT: 24.4 % — ABNORMAL LOW (ref 39.0–52.0)
HEMATOCRIT: 23.9 % — AB (ref 39.0–52.0)
Hemoglobin: 7.4 g/dL — ABNORMAL LOW (ref 13.0–17.0)
Hemoglobin: 7.3 g/dL — ABNORMAL LOW (ref 13.0–17.0)
Hemoglobin: 7.3 g/dL — ABNORMAL LOW (ref 13.0–17.0)
MCH: 27.7 pg (ref 26.0–34.0)
MCH: 26.7 pg (ref 26.0–34.0)
MCH: 27.2 pg (ref 26.0–34.0)
MCHC: 30.5 g/dL (ref 30.0–36.0)
MCHC: 29.9 g/dL — AB (ref 30.0–36.0)
MCHC: 30.5 g/dL (ref 30.0–36.0)
MCV: 91 fL (ref 78.0–100.0)
MCV: 89.4 fL (ref 78.0–100.0)
MCV: 89.2 fL (ref 78.0–100.0)
PLATELETS: 79 10*3/uL — AB (ref 150–400)
PLATELETS: 89 10*3/uL — AB (ref 150–400)
PLATELETS: 90 10*3/uL — AB (ref 150–400)
RBC: 2.67 MIL/uL — ABNORMAL LOW (ref 4.22–5.81)
RBC: 2.73 MIL/uL — ABNORMAL LOW (ref 4.22–5.81)
RBC: 2.68 MIL/uL — AB (ref 4.22–5.81)
RDW: 17.7 % — AB (ref 11.5–15.5)
RDW: 17.6 % — AB (ref 11.5–15.5)
RDW: 17.7 % — ABNORMAL HIGH (ref 11.5–15.5)
WBC: 7.7 10*3/uL (ref 4.0–10.5)
WBC: 7.4 10*3/uL (ref 4.0–10.5)
WBC: 7.3 10*3/uL (ref 4.0–10.5)

## 2015-11-19 LAB — RENAL FUNCTION PANEL
Albumin: 3.2 g/dL — ABNORMAL LOW (ref 3.5–5.0)
Anion gap: 12 (ref 5–15)
BUN: 40 mg/dL — AB (ref 6–20)
CHLORIDE: 98 mmol/L — AB (ref 101–111)
CO2: 27 mmol/L (ref 22–32)
CREATININE: 6.26 mg/dL — AB (ref 0.61–1.24)
Calcium: 9.1 mg/dL (ref 8.9–10.3)
GFR calc non Af Amer: 8 mL/min — ABNORMAL LOW (ref >60)
GFR, EST AFRICAN AMERICAN: 9 mL/min — AB (ref >60)
Glucose, Bld: 111 mg/dL — ABNORMAL HIGH (ref 65–99)
POTASSIUM: 4.1 mmol/L (ref 3.5–5.1)
Phosphorus: 4.1 mg/dL (ref 2.5–4.6)
Sodium: 137 mmol/L (ref 135–145)

## 2015-11-19 LAB — BASIC METABOLIC PANEL
ANION GAP: 11 (ref 5–15)
BUN: 31 mg/dL — AB (ref 6–20)
CALCIUM: 9 mg/dL (ref 8.9–10.3)
CO2: 27 mmol/L (ref 22–32)
CREATININE: 5.17 mg/dL — AB (ref 0.61–1.24)
Chloride: 99 mmol/L — ABNORMAL LOW (ref 101–111)
GFR calc Af Amer: 12 mL/min — ABNORMAL LOW (ref >60)
GFR, EST NON AFRICAN AMERICAN: 10 mL/min — AB (ref >60)
GLUCOSE: 102 mg/dL — AB (ref 65–99)
Potassium: 4.1 mmol/L (ref 3.5–5.1)
Sodium: 137 mmol/L (ref 135–145)

## 2015-11-19 LAB — CREATININE, SERUM
Creatinine, Ser: 5.69 mg/dL — ABNORMAL HIGH (ref 0.61–1.24)
GFR, EST AFRICAN AMERICAN: 10 mL/min — AB (ref >60)
GFR, EST NON AFRICAN AMERICAN: 9 mL/min — AB (ref >60)

## 2015-11-19 LAB — PROTIME-INR
INR: 1.64 — ABNORMAL HIGH (ref 0.00–1.49)
Prothrombin Time: 19.4 seconds — ABNORMAL HIGH (ref 11.6–15.2)

## 2015-11-19 SURGERY — A/V SHUNTOGRAM/FISTULAGRAM
Anesthesia: LOCAL | Laterality: Left

## 2015-11-19 MED ORDER — SODIUM CHLORIDE 0.9% FLUSH
3.0000 mL | Freq: Two times a day (BID) | INTRAVENOUS | Status: DC
  Administered 2015-11-19 – 2015-11-21 (×5): 3 mL via INTRAVENOUS

## 2015-11-19 MED ORDER — IODIXANOL 320 MG/ML IV SOLN
INTRAVENOUS | Status: DC | PRN
  Administered 2015-11-19: 45 mL via INTRAVENOUS

## 2015-11-19 MED ORDER — HEPARIN (PORCINE) IN NACL 2-0.9 UNIT/ML-% IJ SOLN
INTRAMUSCULAR | Status: AC
  Filled 2015-11-19: qty 500

## 2015-11-19 MED ORDER — SODIUM CHLORIDE 0.9 % IV SOLN: 250.0000 mL | INTRAVENOUS | Status: DC | PRN

## 2015-11-19 MED ORDER — PENTAFLUOROPROP-TETRAFLUOROETH EX AERO: 1.0000 "application " | INHALATION_SPRAY | CUTANEOUS | Status: DC | PRN

## 2015-11-19 MED ORDER — HEPARIN (PORCINE) IN NACL 2-0.9 UNIT/ML-% IJ SOLN
INTRAMUSCULAR | Status: DC | PRN
  Administered 2015-11-19: 500 mL

## 2015-11-19 MED ORDER — LIDOCAINE-PRILOCAINE 2.5-2.5 % EX CREA: 1.0000 "application " | TOPICAL_CREAM | CUTANEOUS | Status: DC | PRN

## 2015-11-19 MED ORDER — LIDOCAINE HCL (PF) 1 % IJ SOLN
INTRAMUSCULAR | Status: AC
  Filled 2015-11-19: qty 30

## 2015-11-19 MED ORDER — SODIUM CHLORIDE 0.9 % IV SOLN: 100.0000 mL | INTRAVENOUS | Status: DC | PRN

## 2015-11-19 MED ORDER — ALTEPLASE 2 MG IJ SOLR: 2.0000 mg | Freq: Once | INTRAMUSCULAR | Status: DC | PRN

## 2015-11-19 MED ORDER — HEPARIN SODIUM (PORCINE) 5000 UNIT/ML IJ SOLN
5000.0000 [IU] | Freq: Three times a day (TID) | INTRAMUSCULAR | Status: DC
  Administered 2015-11-19 – 2015-11-22 (×9): 5000 [IU] via SUBCUTANEOUS
  Filled 2015-11-19 (×9): qty 1

## 2015-11-19 MED ORDER — FENTANYL CITRATE (PF) 100 MCG/2ML IJ SOLN
INTRAMUSCULAR | Status: AC
  Filled 2015-11-19: qty 2

## 2015-11-19 MED ORDER — LIDOCAINE HCL (PF) 1 % IJ SOLN: 5.0000 mL | INTRAMUSCULAR | Status: DC | PRN

## 2015-11-19 MED ORDER — ACETAMINOPHEN 325 MG PO TABS: 650.0000 mg | ORAL_TABLET | ORAL | Status: DC | PRN

## 2015-11-19 MED ORDER — HEPARIN SODIUM (PORCINE) 1000 UNIT/ML DIALYSIS: 1000.0000 [IU] | INTRAMUSCULAR | Status: DC | PRN

## 2015-11-19 MED ORDER — LIDOCAINE HCL (PF) 1 % IJ SOLN
INTRAMUSCULAR | Status: DC | PRN
Start: 2015-11-19 — End: 2015-11-19
  Administered 2015-11-19: 5 mL

## 2015-11-19 MED ORDER — ONDANSETRON HCL 4 MG/2ML IJ SOLN: 4.0000 mg | Freq: Four times a day (QID) | INTRAMUSCULAR | Status: DC | PRN

## 2015-11-19 MED ORDER — FENTANYL CITRATE (PF) 100 MCG/2ML IJ SOLN
INTRAMUSCULAR | Status: DC | PRN
  Administered 2015-11-19: 50 ug via INTRAVENOUS

## 2015-11-19 MED ORDER — SODIUM CHLORIDE 0.9% FLUSH: 3.0000 mL | INTRAVENOUS | Status: DC | PRN

## 2015-11-19 SURGICAL SUPPLY — 11 items
BAG SNAP BAND KOVER 36X36 (MISCELLANEOUS) ×2 IMPLANT
COVER DOME SNAP 22 D (MISCELLANEOUS) ×2 IMPLANT
COVER PRB 48X5XTLSCP FOLD TPE (BAG) ×2 IMPLANT
COVER PROBE 5X48 (BAG) ×2
KIT MICROINTRODUCER STIFF 5F (SHEATH) ×2 IMPLANT
PROTECTION STATION PRESSURIZED (MISCELLANEOUS) ×2
STATION PROTECTION PRESSURIZED (MISCELLANEOUS) ×1 IMPLANT
STOPCOCK MORSE 400PSI 3WAY (MISCELLANEOUS) ×2 IMPLANT
TRAY PV CATH (CUSTOM PROCEDURE TRAY) ×2 IMPLANT
TUBING CIL FLEX 10 FLL-RA (TUBING) ×2 IMPLANT
WIRE TORQFLEX AUST .018X40CM (WIRE) ×2 IMPLANT

## 2015-11-19 NOTE — Op Note (Signed)
    OPERATIVE NOTE   PROCEDURE: 1. left radiocephalic arteriovenous fistula cannulation under ultrasound guidance 2. left arm fistulogram  PRE-OPERATIVE DIAGNOSIS: Malfunctioning left radiocephalic arteriovenous fistula  POST-OPERATIVE DIAGNOSIS: same as above   SURGEON: Adele Barthel, MD  ANESTHESIA: local  ESTIMATED BLOOD LOSS: 5 cc  FINDING(S): 1. Sclerotic fistula throughout forearm segment: high risk of occlusion 2. Proximal cephalic vein in upper arm patent but central segment appears to drain into collateral veins without definitive drainage into left innominate vein 3. Primary drainage from forearm from via brachial > basilic veins 4. Proximal central venous system 5. Sclerotic distal radial artery with evidence of some collateral flow into left hand  SPECIMEN(S):  None  CONTRAST: 45 cc  INDICATIONS: Bruce Cole is a 75 y.o. male who  presents with malfunctioning left radiocephalic arteriovenous fistula.  The patient is scheduled for left arm fistulogram.  The patient is aware the risks include but are not limited to: bleeding, infection, thrombosis of the cannulated access, and possible anaphylactic reaction to the contrast.  The patient is aware of the risks of the procedure and elects to proceed forward.  DESCRIPTION: After full informed written consent was obtained, the patient was brought back to the angiography suite and placed supine upon the angiography table.  The patient was connected to monitoring equipment.  The left forarm was prepped and draped in the standard fashion for a left arm fistulogram.  Under ultrasound guidance, the left radiocephalic arteriovenous fistula was cannulated with a micropuncture needle.  The microwire was advanced into the fistula and the needle was exchanged for the a microsheath, which was lodged 2 cm into the access.  The wire was removed and the sheath was connected to the IV extension tubing.  Hand injections were completed to  image the access from the antecubitum up to the level of axilla.  The central venous structures were also imaged by hand injections.  Based on the images, this patient will need: ligation of left radiocephalic arteriovenous fistula and placement of left basilic vein transposition vs. brachial vein transposition.  A 4-0 Monocryl purse-string suture was sewn around the sheath.  The sheath was removed while tying down the suture.  A sterile bandage was applied to the puncture site.  COMPLICATIONS: none  CONDITION: stable  Adele Barthel, MD [facility] [place] Office: [phone] Pager: [phone]  11/19/2015 10:47 AM

## 2015-11-19 NOTE — H&P (Signed)
Brief History and Physical  History of Present Illness  Bruce Cole is a 75 y.o. male who presents with chief complaint: poor functioning L RC AVF.  The patient presents today for L arm fistulogram.    Past Medical History  Diagnosis Date  . Nephrolithiasis   . Arthritis     rheumatoid  . Colon polyp 2000  . Atrial fibrillation ([place])   . Unspecified essential hypertension   . Gout   . CHF (congestive heart failure) [facility])     Past Surgical History  Procedure Laterality Date  . Spine surgery      x 2  . Joint replacement      Total L-Hip replacement, Right Knee 10/20/09  . Cholecystectomy  1994  . Left and right heart catheterization with coronary angiogram N/A 02/22/2013    Procedure: LEFT AND RIGHT HEART CATHETERIZATION WITH CORONARY ANGIOGRAM;  Surgeon: Jolaine Artist, MD;  Location: [facility] CATH LAB;  Service: Cardiovascular;  Laterality: N/A;  . Av fistula placement Left 08/26/2015    Procedure: LEFT RADIOCEPHALIC FISTULA CREATION;  Surgeon: Rosetta Posner, MD;  Location: [facility] OR;  Service: Vascular;  Laterality: Left;  . Esophagogastroduodenoscopy N/A 11/13/2015    Procedure: ESOPHAGOGASTRODUODENOSCOPY (EGD);  Surgeon: Irene Shipper, MD;  Location: [facility] ENDOSCOPY;  Service: Endoscopy;  Laterality: N/A;    Social History   Social History  . Marital Status: Single    Spouse Name: N/A  . Number of Children: 3  . Years of Education: N/A   Occupational History  .     Social History Main Topics  . Smoking status: Never Smoker   . Smokeless tobacco: Never Used  . Alcohol Use: Yes     Comment: occasional  . Drug Use: No  . Sexual Activity: Not on file   Other Topics Concern  . Not on file   Social History Narrative   Former [employer] [place] [employer] [place]    Family History  Problem Relation Age of Onset  . Hypertension Mother   . Arthritis Mother     ?RA  . Hypertension Father     No current facility-administered medications on file prior to encounter.     Current Outpatient Prescriptions on File Prior to Encounter  Medication Sig Dispense Refill  . albuterol (PROVENTIL) (2.5 MG/3ML) 0.083% nebulizer solution Take 2.5 mg by nebulization every 4 (four) hours as needed for shortness of breath. Reported on 09/25/2015    . allopurinol (ZYLOPRIM) 100 MG tablet TAKE 2 TABLETS BY MOUTH EVERY DAY 180 tablet 0  . carvedilol (COREG) 3.125 MG tablet Take 3.125 mg by mouth daily.    . fluticasone (FLONASE) 50 MCG/ACT nasal spray Place 2 sprays into both nostrils daily. 16 g 6  . furosemide (LASIX) 80 MG tablet TAKE 1 TABLET(80 MG) BY MOUTH THREE TIMES DAILY 90 tablet 5  . hydrALAZINE (APRESOLINE) 50 MG tablet Take 50 mg by mouth 2 (two) times daily.    . hydroxychloroquine (PLAQUENIL) 200 MG tablet Take 200 mg by mouth 2 (two) times daily.    . isosorbide mononitrate (IMDUR) 30 MG 24 hr tablet TAKE 1 TABLET BY MOUTH EVERY DAY 90 tablet 0  . metolazone (ZAROXOLYN) 5 MG tablet Take 1 tablet (5 mg total) by mouth 2 (two) times a week. AS NEEDED FOR WT GREATER THAN 265 20 tablet 3  . Multiple Vitamin (MULTIVITAMIN WITH MINERALS) TABS tablet Take 1 tablet by mouth daily.    . potassium chloride  SA (K-DUR,KLOR-CON) 20 MEQ tablet Take 20 mEq by mouth 3 (three) times daily.    [name] terazosin (HYTRIN) 1 MG capsule TAKE 1 CAPSULE BY MOUTH EVERY NIGHT AT BEDTIME 90 capsule 1  . colchicine 0.6 MG tablet TAKE 2 TABLETS BY MOUTH AT THE START OF GOUT FLARE AND TAKE 1 TABLET 3 HOURS LATER; MAX 3 TABS PER GOUT ATTACK (Patient not taking: Reported on 11/10/2015) 30 tablet 0  . warfarin (COUMADIN) 7.5 MG tablet TAKE 1 TABLET BY MOUTH DAILY AS DIRECTED BY COUMADIN CLINIC (Patient not taking: Reported on 11/10/2015) 120 tablet 0    Allergies  Allergen Reactions  . Ace Inhibitors Other (See Comments)    Worsening renal insufficiency    Review of Systems: As listed above, otherwise negative.  Physical Examination  Filed Vitals:   11/18/15 2012 11/19/15 0028 11/19/15 0438  11/19/15 0737  BP: 90/46 117/72 106/64 108/67  Pulse: 57 55 55 58  Temp: 98.3 F (36.8 C) 98.3 F (36.8 C) 98.5 F (36.9 C) 98.3 F (36.8 C)  TempSrc: Oral Oral Oral Oral  Resp: 25 19    Height:      Weight:   246 lb 1.6 oz (111.63 kg)   SpO2: 96% 98% 100% 97%    General: A&O x 3, WDWN  Pulmonary: Sym exp, good air movt, CTAB, no rales, rhonchi, & wheezing  Cardiac: RRR, Nl S1, S2, no Murmurs, rubs or gallops  Gastrointestinal: soft, NTND, -G/R, - HSM, - masses, - CVAT B  Musculoskeletal: M/S 5/5 throughout , Extremities without ischemic changes   Laboratory: CBC:    Component Value Date/Time   WBC 7.7 11/19/2015 0248   RBC 2.67* 11/19/2015 0248   RBC 2.79* 11/12/2015 0507   HGB 7.4* 11/19/2015 0248   HCT 24.3* 11/19/2015 0248   PLT 79* 11/19/2015 0248   MCV 91.0 11/19/2015 0248   MCH 27.7 11/19/2015 0248   MCHC 30.5 11/19/2015 0248   RDW 17.7* 11/19/2015 0248   LYMPHSABS 0.7 11/10/2015 1550   MONOABS 0.5 11/10/2015 1550   EOSABS 0.0 11/10/2015 1550   BASOSABS 0.0 11/10/2015 1550    BMP:    Component Value Date/Time   NA 137 11/19/2015 0248   K 4.1 11/19/2015 0248   CL 99* 11/19/2015 0248   CO2 27 11/19/2015 0248   GLUCOSE 102* 11/19/2015 0248   BUN 31* 11/19/2015 0248   CREATININE 5.17* 11/19/2015 0248   CREATININE 1.77* 02/13/2013 1505   CALCIUM 9.0 11/19/2015 0248   GFRNONAA 10* 11/19/2015 0248   GFRAA 12* 11/19/2015 0248    Coagulation: Lab Results  Component Value Date   INR 1.64* 11/19/2015   INR 1.70* 11/18/2015   [name]5* 11/17/2015   No results found for: PTT  Lipids:    Component Value Date/Time   CHOL 176 03/18/2015 0727   TRIG 70.0 03/18/2015 0727   HDL 67.40 03/18/2015 0727   CHOLHDL 3 03/18/2015 0727   VLDL 14.0 03/18/2015 0727   [place] 95 03/18/2015 0727    Medical Decision Making  Bruce Cole is a 75 y.o. male who presents with: malfunctioning L RC AVF.   The patient is scheduled for: L arm fistulogram  I  discussed with the patient the nature of angiographic procedures, especially the limited patencies of any endovascular intervention.  The patient is aware of that the risks of an angiographic procedure include but are not limited to: bleeding, infection, access site complications, renal failure, embolization, rupture of vessel, dissection, possible need for  emergent surgical intervention, possible need for surgical procedures to treat the patient's pathology, and stroke and death.    The patient is aware of the risks and agrees to proceed.  Adele Barthel, MD [facility] [place] Office: [phone] Pager: [phone]  11/19/2015, 8:31 AM

## 2015-11-19 NOTE — Progress Notes (Signed)
RN spoke with pt at length today about how he was feeling. Pt feels alone, states he wishes his family was here for me. Pt is upset about his prognosis and is unsure about his quality of life. RN will place Chaplain consult. Etta Quill, RN

## 2015-11-19 NOTE — Progress Notes (Signed)
Pt feeling like he has not been kept informed today. RN has updated patient. Nephrology was called, Awaiting new updates

## 2015-11-19 NOTE — Progress Notes (Addendum)
Patient ID: Bruce Cole, male   DOB: Jan 26, 1941, 75 y.o.   MRN: [id number]    PROGRESS NOTE    Bruce Cole  [id number] DOB: [birth year] DOA: 11/10/2015  PCP: Bruce Cole., NP   Outpatient Specialists:   Brief Narrative:  75 y.o. male with rheumatoid arthritis, chronic atrial fibrillation on warfarin, chronic systolic CHF, and chronic kidney disease stage IV who presented to the ED at [facility] for evaluation of dyspnea and malaise 4 days duration. Patient has been followed closely by outpatient cardiologist and nephrologist and had AV fistula created in the left upper extremity on 08/26/2015, but has not yet required dialysis.  ED Course: Upon arrival to the [facility] ED, Chest x-ray demonstrated a right sided pleural effusion with an associated opacity in the right base. Transferred to [facility] for admission.  Major events: 4/27 - more dyspneic in the afternoon 4 pm, ordered CXR as pt unable to tolerate CT, PCCM consulted, ABG pend, also call nephrology team again to consider HD sooner than expected 5/4 - pt reports feeling better but still has questions, offered to answer questions, he said he wants to rest and he needs to speak with nephrologist   Assessment & Plan:   1. GI bleed with symptomatic anemia  - 2 units pRBCs ordered for transfusion on admission  - EGD done 4/28, notable for mild gastritis and per GI may cause heme positive stool in face of over [facility] - otherwise normal EGD - Recommendation is to continue PPI for upper GI mucosal protection   2. Chronic atrial fibrillation with supratherapeutic INR, bradycardia this AM - CHADS-VASc 3 (age, HTN, CH) - INR trending down: 5.03 --> 3.8 --> 1.70 --> 1.64 - per cardiology, still holding coreg  - pt is on Imdur only for now  - cardiology's assistance is appreciated   3. AKI on CKD IV --> ESRD now on HD - SCr is 5.93 on admission, up from 3.3 on 10/27/15  - Nephrology team following  -  AVF in LUE created on 08/26/15 - s/p fistulogram today  - pt said he has many questions and I offered to answer any questions, he replied he needs to rest and that I am not the right person, he requested to speak with his doctor, Dr. Florene Cole  - per nurse pt wants to know about any further HD plans   4. Chronic systolic CHF/Volume overload  - currently receiving 160 mg IV lasix BID with poor urine output - TTE (08/18/15) with EF 40-45%  - Wt is 265 on admission --> 275 lbs --> 272 lbs --> 262 lbs --> 254 lbs -- 250 lbs --> 250 lbs this AM - HD per nephrology  - pt still volume overloaded but better overall   5. Hypertension, essential - currently on Imdur and BP has been reasonably stable   6. Rheumatoid arthritis  - Stable  - Continue home-dose Plaquenil   7. Right pleural effusion  - CT chest confirms right pleural effusion with extensive passive atelectasis, loculated component of the pleural effusion  - d/w PCCM, can tap when INR lower, so for now on hold  - since INR down to 1.64, will need to discuss with PCCM if they can tap the fluid now but again when I tried to approach this subject, pt has decline to discuss this with me   8. Prolonged QTc - QTc 555 ms on admission  - Avoiding agents that would further prolong  - Monitor  on telemetry   9. Obesity  - Body mass index is 34.15 kg/(m^2).  10. Thrombocytopenia - no signs of bleeding  - slightly better this AM - CBC in AM  DVT prophylaxis: SCD's for now  Code Status: Full Family Communication: Patient at bedside, I offered to answer any questions of several occasions and pt has replied I am not the right person, he wants to speak with Dr. Florene Cole. I also asked if he wants me to call his family and he has just closed his eyes and said he needs to rest. I called phone number in the system (his daughter) but unclear if that is the right number. I left a message to call me back, awaiting response.  Disposition Plan: per  nephrology team   Consultants:   Nephrology  Cardiology   GI  PCCM  Vascular   Procedures:   EGD 4/28 - mild gastritis, continue with PPI  HD cath 4/28   Fistulogram 5/4   Antimicrobials:  None   Subjective: Reports feeling better this AM but upset and he said his questions have not been answered.   Objective: Filed Vitals:   11/19/15 1039 11/19/15 1044 11/19/15 1219 11/19/15 1603  BP: 119/74  121/68 102/56  Pulse: 60 0  58  Temp:    98.6 F (37 C)  TempSrc:    Oral  Resp: 13 0 14   Height:      Weight:      SpO2: 97% 0%  97%    Intake/Output Summary (Last 24 hours) at 11/19/15 1644 Last data filed at 11/19/15 1603  Gross per 24 hour  Intake    480 ml  Output   3221 ml  Net  -2741 ml   Filed Weights   11/18/15 1354 11/18/15 1829 11/19/15 0438  Weight: 113 kg (249 lb 1.9 oz) 109.9 kg (242 lb 4.6 oz) 111.63 kg (246 lb 1.6 oz)    Examination: General: Calm, alert, NAD Pt asked me not to examine him this am, he asked to be left alone to rest   Data Reviewed: I have personally reviewed following labs and imaging studies  CBC:  Recent Labs Lab 11/17/15 0508 11/18/15 0410 11/18/15 2201 11/19/15 0248 11/19/15 1129  WBC 6.5 7.6 7.8 7.7 7.4  HGB 7.6* 7.5* 7.5* 7.4* 7.3*  HCT 24.6* 24.5* 24.6* 24.3* 24.4*  MCV 87.9 90.1 87.9 91.0 89.4  PLT 92* 101* 79* 79* 89*   Basic Metabolic Panel:  Recent Labs Lab 11/16/15 0740 11/16/15 1223 11/17/15 0508 11/18/15 0410 11/18/15 2201 11/19/15 0248 11/19/15 1129  NA 134* 138 136 136  137 137 137  --   K 4.2 3.7 4.2 3.7  3.7 4.0 4.1  --   CL 95* 99* 99* 98*  98* 99* 99*  --   CO2 25 26 24 26  26 27 27   --   GLUCOSE 103* 86 93 104*  105* 114* 102*  --   BUN 71* 43* 52* 38*  38* 28* 31*  --   CREATININE 6.67* 4.70* 6.03* 5.48*  5.54* 4.91* 5.17* 5.69*  CALCIUM 9.3 9.0 9.3 9.1  9.1 8.9 9.0  --   PHOS 5.1* 3.8 4.5 3.6 3.3  --   --    Liver Function Tests:  Recent Labs Lab 11/16/15 0740  11/16/15 1223 11/17/15 0508 11/18/15 0410 11/18/15 2201  ALBUMIN 3.5 3.6 3.5 3.5 3.4*   Coagulation Profile:  Recent Labs Lab 11/15/15 0521 11/16/15 0533 11/17/15 0508 11/18/15 0410 11/19/15  0248  INR 2.95* 2.27* 1.95* 1.70* 1.64*   Urine analysis:    Component Value Date/Time   COLORURINE YELLOW 11/10/2015 1800   APPEARANCEUR CLEAR 11/10/2015 1800   LABSPEC 1.012 11/10/2015 1800   PHURINE 5.5 11/10/2015 1800   GLUCOSEU NEGATIVE 11/10/2015 1800   GLUCOSEU NEGATIVE 05/05/2015 1135   HGBUR NEGATIVE 11/10/2015 1800   HGBUR negative 09/21/2009 1008   BILIRUBINUR NEGATIVE 11/10/2015 1800   KETONESUR NEGATIVE 11/10/2015 1800   PROTEINUR NEGATIVE 11/10/2015 1800   UROBILINOGEN 0.2 05/05/2015 1135   NITRITE NEGATIVE 11/10/2015 1800   LEUKOCYTESUR NEGATIVE 11/10/2015 1800    Recent Results (from the past 240 hour(s))  MRSA PCR Screening     Status: None   Collection Time: 11/10/15  8:50 PM  Result Value Ref Range Status   MRSA by PCR NEGATIVE NEGATIVE Final    Radiology Studies: Dg Chest Port 1 View 11/10/2015  Right pleural effusion and basilar pulmonary opacity. Cardiomegaly is noted.  Ct Chest Wo Contrast 11/12/2015  Prominent systemic venous structures favoring elevated right heart pressures. Prominent right atrial enlargement and moderate enlargement of the rest of the heart. 2. I am unsure if the low-density posteriorly into the right in the right auricle represents the dentate margin of the right auricle or thrombus. Consider echocardiography with attention to this region. 3. Large right pleural effusion with extensive passive atelectasis. Loculated component of the pleural effusion anteriorly. 4. Prominent airway thickening and near collapse in the right mainstem bronchus, right bronchus intermedius, and in the left lower lobe bronchi. This raises suspicion for the possibility of bronchomalacia. 5. Diffuse subcutaneous edema and mild perisplenic ascites, likely due to  diffuse third spacing of fluid. There is also bilateral gynecomastia. 6. Stable aneurysmal thoracic aorta, measuring up to 4.7 cm along the posterior arch.   Dg Chest Port 1 View 11/12/2015  Right IJ line tip is projects in the lower right internal jugular vein, just above the brachiocephalic confluence. If SVC placement is desired, advanced 3 cm. 2. Stable large right pleural effusion and stable cardiomegaly   Scheduled Meds: . allopurinol  200 mg Oral Daily  . darbepoetin (ARANESP) injection - NON-DIALYSIS  100 mcg Subcutaneous Q Wed-1800  . fluticasone  2 spray Each Nare Daily  . heparin  5,000 Units Subcutaneous Q8H  . hydroxychloroquine  200 mg Oral BID  . isosorbide mononitrate  30 mg Oral Daily  . multivitamin with minerals  1 tablet Oral Daily  . pantoprazole  40 mg Oral BID  . sevelamer carbonate  800 mg Oral TID WC  . sodium chloride flush  3 mL Intravenous Q12H  . sodium chloride flush  3 mL Intravenous Q12H   Continuous Infusions:    LOS: 9 days   Time spent: 20 minutes   Faye Ramsay, MD [facility] Pager [phone]  If 7PM-7AM, please contact night-coverage [email or web address] Password [email or web address] 11/19/2015, 4:44 PM

## 2015-11-19 NOTE — Progress Notes (Signed)
    Subjective  -  Fistula not able to be used yesterday   Physical Exam:  CV:RRR Thrill in left R-C fsitula       Assessment/Plan:    Plan for fistulogram today  Annamarie Major 11/19/2015 7:54 AM --  Filed Vitals:   11/19/15 0438 11/19/15 0737  BP: 106/64 108/67  Pulse: 55 58  Temp: 98.5 F (36.9 C) 98.3 F (36.8 C)  Resp:      Intake/Output Summary (Last 24 hours) at 11/19/15 0754 Last data filed at 11/18/15 2224  Gross per 24 hour  Intake    600 ml  Output   3141 ml  Net  -2541 ml     Laboratory CBC    Component Value Date/Time   WBC 7.7 11/19/2015 0248   HGB 7.4* 11/19/2015 0248   HCT 24.3* 11/19/2015 0248   PLT 79* 11/19/2015 0248    BMET    Component Value Date/Time   NA 137 11/19/2015 0248   K 4.1 11/19/2015 0248   CL 99* 11/19/2015 0248   CO2 27 11/19/2015 0248   GLUCOSE 102* 11/19/2015 0248   BUN 31* 11/19/2015 0248   CREATININE 5.17* 11/19/2015 0248   CREATININE 1.77* 02/13/2013 1505   CALCIUM 9.0 11/19/2015 0248   GFRNONAA 10* 11/19/2015 0248   GFRAA 12* 11/19/2015 0248    COAG Lab Results  Component Value Date   INR 1.64* 11/19/2015   INR 1.70* 11/18/2015   [name]5* 11/17/2015   No results found for: PTT  Antibiotics Anti-infectives    Start     Dose/Rate Route Frequency Ordered Stop   11/10/15 2200  hydroxychloroquine (PLAQUENIL) tablet 200 mg     200 mg Oral 2 times daily 11/10/15 2134         V. Leia Alf, M.D. [facility] [place] Office: [phone] Pager:  [phone]

## 2015-11-19 NOTE — Progress Notes (Signed)
[facility] ROUNDING NOTE   Subjective:   Interval History:  Still with edema in lower legs   Objective:  Vital signs in last 24 hours:  Temp:  [98.3 F (36.8 C)-98.6 F (37 C)] 98.6 F (37 C) (05/04 1603) Pulse Rate:  [0-178] 58 (05/04 1603) Resp:  [0-53] 14 (05/04 1219) BP: (90-121)/(46-74) 102/56 mmHg (05/04 1603) SpO2:  [0 %-100 %] 97 % (05/04 1603) Weight:  [111.63 kg (246 lb 1.6 oz)] 111.63 kg (246 lb 1.6 oz) (05/04 0438)  Weight change: -0.4 kg (-14.1 oz) Filed Weights   11/18/15 1354 11/18/15 1829 11/19/15 0438  Weight: 113 kg (249 lb 1.9 oz) 109.9 kg (242 lb 4.6 oz) 111.63 kg (246 lb 1.6 oz)    Intake/Output: I/O last 3 completed shifts: In: 24 [P.O.:1560] Out: 3221 [Urine:80; Other:3141]   Intake/Output this shift:     CVS- RRR RS- CTA ABD- BS present soft non-distended EXT-  3 +  edema   Basic Metabolic Panel:  Recent Labs Lab 11/16/15 0740 11/16/15 1223 11/17/15 0508 11/18/15 0410 11/18/15 2201 11/19/15 0248 11/19/15 1129  NA 134* 138 136 136  137 137 137  --   K 4.2 3.7 4.2 3.7  3.7 4.0 4.1  --   CL 95* 99* 99* 98*  98* 99* 99*  --   CO2 25 26 24 26  26 27 27   --   GLUCOSE 103* 86 93 104*  105* 114* 102*  --   BUN 71* 43* 52* 38*  38* 28* 31*  --   CREATININE 6.67* 4.70* 6.03* 5.48*  5.54* 4.91* 5.17* 5.69*  CALCIUM 9.3 9.0 9.3 9.1  9.1 8.9 9.0  --   PHOS 5.1* 3.8 4.5 3.6 3.3  --   --     Liver Function Tests:  Recent Labs Lab 11/16/15 0740 11/16/15 1223 11/17/15 0508 11/18/15 0410 11/18/15 2201  ALBUMIN 3.5 3.6 3.5 3.5 3.4*    Recent Labs Lab 11/14/15 0625  LIPASE 92*  AMYLASE 268*   No results for input(s): AMMONIA in the last 168 hours.  CBC:  Recent Labs Lab 11/17/15 0508 11/18/15 0410 11/18/15 2201 11/19/15 0248 11/19/15 1129  WBC 6.5 7.6 7.8 7.7 7.4  HGB 7.6* 7.5* 7.5* 7.4* 7.3*  HCT 24.6* 24.5* 24.6* 24.3* 24.4*  MCV 87.9 90.1 87.9 91.0 89.4  PLT 92* 101* 79* 79* 89*     Cardiac Enzymes: No results for input(s): CKTOTAL, CKMB, CKMBINDEX, TROPONINI in the last 168 hours.  BNP: Invalid input(s): POCBNP  CBG: No results for input(s): GLUCAP in the last 168 hours.  Microbiology: Results for orders placed or performed during the hospital encounter of 11/10/15  MRSA PCR Screening     Status: None   Collection Time: 11/10/15  8:50 PM  Result Value Ref Range Status   MRSA by PCR NEGATIVE NEGATIVE Final    Comment:        The GeneXpert MRSA Assay (FDA approved for NASAL specimens only), is one component of a comprehensive MRSA colonization surveillance program. It is not intended to diagnose MRSA infection nor to guide or monitor treatment for MRSA infections.     Coagulation Studies:  Recent Labs  11/17/15 0508 11/18/15 0410 11/19/15 0248  LABPROT 22.2* 20.0* 19.4*  [name]5* 1.70* 1.64*    Urinalysis: No results for input(s): COLORURINE, LABSPEC, PHURINE, GLUCOSEU, HGBUR, BILIRUBINUR, KETONESUR, PROTEINUR, UROBILINOGEN, NITRITE, LEUKOCYTESUR in the last 72 hours.  Invalid input(s): APPERANCEUR    Imaging: No results found.  Medications:     . allopurinol  200 mg Oral Daily  . darbepoetin (ARANESP) injection - NON-DIALYSIS  100 mcg Subcutaneous Q Wed-1800  . fluticasone  2 spray Each Nare Daily  . heparin  5,000 Units Subcutaneous Q8H  . hydroxychloroquine  200 mg Oral BID  . isosorbide mononitrate  30 mg Oral Daily  . multivitamin with minerals  1 tablet Oral Daily  . pantoprazole  40 mg Oral BID  . sevelamer carbonate  800 mg Oral TID WC  . sodium chloride flush  3 mL Intravenous Q12H  . sodium chloride flush  3 mL Intravenous Q12H   sodium chloride, sodium chloride, sodium chloride, sodium chloride, acetaminophen, albuterol, alteplase, heparin, heparin, heparin, HYDROcodone-acetaminophen, lidocaine (PF), lidocaine-prilocaine, ondansetron (ZOFRAN) IV, pentafluoroprop-tetrafluoroeth, promethazine, sodium chloride flush,  sodium chloride flush, zolpidem  Assessment/ Plan:   This is a delightful man with weight gain of up to 290 lbs and has been dialyzed over last 4 days and is now is less 250 lbs.  1. CKD stage V --  Now ESRD  Secondary to hypertensive nephrosclerosis   2. AVF  --  Underwent fistulogram today and waiting to talk to Dr Bridgett Larsson  3. HTN  -- controlled  4. Anemia  -- GI bleed  Gastritis  Hb 7  Transfused will follow  5. Atrial fibrillation  INR therapeutic    6. RA stable  7. Volume overload  EF 45 %   Continue ultrafiltration  Dialysis in AM   LOS: 9 Bruce Cole W @TODAY @7 :26 PM

## 2015-11-20 ENCOUNTER — Inpatient Hospital Stay (HOSPITAL_COMMUNITY): Payer: Medicare Other

## 2015-11-20 LAB — RENAL FUNCTION PANEL
Albumin: 3.4 g/dL — ABNORMAL LOW (ref 3.5–5.0)
Anion gap: 14 (ref 5–15)
BUN: 45 mg/dL — AB (ref 6–20)
CHLORIDE: 95 mmol/L — AB (ref 101–111)
CO2: 27 mmol/L (ref 22–32)
Calcium: 9.3 mg/dL (ref 8.9–10.3)
Creatinine, Ser: 6.47 mg/dL — ABNORMAL HIGH (ref 0.61–1.24)
GFR calc Af Amer: 9 mL/min — ABNORMAL LOW (ref >60)
GFR calc non Af Amer: 8 mL/min — ABNORMAL LOW (ref >60)
GLUCOSE: 90 mg/dL (ref 65–99)
POTASSIUM: 4.3 mmol/L (ref 3.5–5.1)
Phosphorus: 4.7 mg/dL — ABNORMAL HIGH (ref 2.5–4.6)
Sodium: 136 mmol/L (ref 135–145)

## 2015-11-20 LAB — CBC
HEMATOCRIT: 23.6 % — AB (ref 39.0–52.0)
Hemoglobin: 7.3 g/dL — ABNORMAL LOW (ref 13.0–17.0)
MCH: 27.5 pg (ref 26.0–34.0)
MCHC: 30.9 g/dL (ref 30.0–36.0)
MCV: 89.1 fL (ref 78.0–100.0)
Platelets: 100 10*3/uL — ABNORMAL LOW (ref 150–400)
RBC: 2.65 MIL/uL — ABNORMAL LOW (ref 4.22–5.81)
RDW: 17.7 % — AB (ref 11.5–15.5)
WBC: 7 10*3/uL (ref 4.0–10.5)

## 2015-11-20 LAB — PROTIME-INR
INR: 1.44 (ref 0.00–1.49)
PROTHROMBIN TIME: 17.6 s — AB (ref 11.6–15.2)

## 2015-11-20 NOTE — Progress Notes (Signed)
   11/20/15 1300  Clinical Encounter Type  Visited With Patient  Visit Type Spiritual support  Referral From Nurse  Spiritual Encounters  Spiritual Needs Emotional  Stress Factors  Patient Stress Factors Family relationships;Health changes;Major life changes  Based on chart notes and conversation with assigned nurse, gathered that patient needed some support for declining health and lack of supportive family presence. However, patient seemed disinclined at this time to talk to chaplain. Chaplain visited for a few minutes and then left, encouraging patient to call if we could be of any assistance and giving my business card. Dunya Meiners, Chaplain

## 2015-11-20 NOTE — Progress Notes (Signed)
Patient ID: Bruce Cole, male   DOB: 11-07-1940, 75 y.o.   MRN: [name]    PROGRESS NOTE    Bruce Cole  [name] DOB: 1940/12/18 DOA: 11/10/2015  PCP: Nance Pear., NP   Outpatient Specialists:   Brief Narrative:  75 y.o. male with rheumatoid arthritis, chronic atrial fibrillation on warfarin, chronic systolic CHF, and chronic kidney disease stage IV who presented to the ED at [facility] for evaluation of dyspnea and malaise 4 days duration. Patient has been followed closely by outpatient cardiologist and nephrologist and had AV fistula created in the left upper extremity on 08/26/2015, but has not yet required dialysis.  ED Course: Upon arrival to the [facility] ED, Chest x-ray demonstrated a right sided pleural effusion with an associated opacity in the right base. Transferred to [facility] for admission.  Major events: 4/27 - more dyspneic in the afternoon 4 pm, ordered CXR as pt unable to tolerate CT, PCCM consulted, ABG pend, also call nephrology team again to consider HD sooner than expected 5/4 - pt reports feeling better but still has questions, offered to answer questions, he said he wants to rest and he needs to speak with nephrologist  5/5 - patient still wanting to know what the course of his care will be but is very reluctant to speak to physicians  Assessment & Plan:   1. GI bleed with symptomatic anemia  - 2 units pRBCs ordered for transfusion on admission; 6.5 on admit - EGD done 4/28, notable for mild gastritis and per GI may cause heme positive stool in face of over [facility] - otherwise normal EGD - continue PPI for upper GI mucosal protection  - H&H stable  2. Chronic atrial fibrillation with supratherapeutic INR, bradycardia this AM - CHADS-VASc 3 (age, HTN, CH) - INR trending down: 5.03 --> 3.8 --> 1.70 --> 1.64 - per cardiology, still holding coreg - resume coreg tomorrow - pt is on Imdur only for now; held tonight due to BP,  resume in AM - cardiology's was appreciated, signed off 5/5  3. AKI on CKD IV --> ESRD now on HD - SCr is 5.93 on admission, up from 3.3 on 10/27/15  - Nephrology team following  - AVF in LUE created on 08/26/15 - s/p fistulogram 5/4   4. Chronic systolic CHF/Volume overload  - currently receiving 160 mg IV lasix BID with poor urine output - TTE (08/18/15) with EF 40-45%  - Wt is 265 on admission --> 275  --> 272  --> 262  --> 254 -- 250 --> 250-->235 lbs this AM - HD per nephrology  - pt still volume overloaded but better overall   5. Hypertension, essential - currently on Imdur and BP has been reasonably stable   6. Rheumatoid arthritis  - Stable  - Continue home dose Plaquenil   7. Right pleural effusion  - CT chest confirms right pleural effusion with extensive passive atelectasis, loculated component of the pleural effusion  - d/w PCCM, can tap when INR lower, so for now on hold  - Patient's INR is now at goal for A pleural effusion -Patient also reluctant with me to discuss issues, states he's embarrassed about his behavior yesterday. We'll revisit this in the a.m. and try to pursue plan forward.  8. Prolonged QTc, resolved - QTc 555 ms on admission, repeat EKG done on 5/5 shows resolution - Monitor on telemetry   9. Obesity  - Body mass index is 34.15 kg/(m^2).  10.  Thrombocytopenia - no signs of bleeding  - slightly better this AM - CBC in AM  DVT prophylaxis: SCD's for now  Code Status: Full Family Communication: Patient at bedside, I offered to answer any questions of several occasions and pt has replied I am not the right person, he wants to speak with Dr. [name]. I also asked if he wants me to call his family and he has just closed his eyes and said he needs to rest. I called phone number in the system (his daughter) but unclear if that is the right number. I left a message to call me back, awaiting response.  Disposition Plan: per nephrology team    Consultants:   Nephrology  Cardiology   GI  PCCM  Vascular   Procedures:   EGD 4/28 - mild gastritis, continue with PPI  HD cath 4/28   Fistulogram 5/4   Antimicrobials:  None   Subjective: Reports feeling better this AM but upset and he said his questions have not been answered.   Objective: Filed Vitals:   11/20/15 1111 11/20/15 1213 11/20/15 1524 11/20/15 1609  BP: 100/61 102/54 99/55 102/61  Pulse: 60 62 60 68  Temp: 98.2 F (36.8 C) 98 F (36.7 C)  98.7 F (37.1 C)  TempSrc: Oral Oral  Oral  Resp: 20 16    Height:      Weight: 107 kg (235 lb 14.3 oz)     SpO2: 100% 97%  100%    Intake/Output Summary (Last 24 hours) at 11/20/15 1824 Last data filed at 11/20/15 1217  Gross per 24 hour  Intake    840 ml  Output   5820 ml  Net  -4980 ml   Filed Weights   11/20/15 0300 11/20/15 0735 11/20/15 1111  Weight: 112.492 kg (248 lb) 112.7 kg (248 lb 7.3 oz) 107 kg (235 lb 14.3 oz)    Examination: General: Calm, alert, NAD Pt asked me not to examine him this am, he asked to be left alone to rest   Data Reviewed: I have personally reviewed following labs and imaging studies  CBC:  Recent Labs Lab 11/18/15 2201 11/19/15 0248 11/19/15 1129 11/19/15 2130 11/20/15 0823  WBC 7.8 7.7 7.4 7.3 7.0  HGB 7.5* 7.4* 7.3* 7.3* 7.3*  HCT 24.6* 24.3* 24.4* 23.9* 23.6*  MCV 87.9 91.0 89.4 89.2 89.1  PLT 79* 79* 89* 90* 123XX123*   Basic Metabolic Panel:  Recent Labs Lab 11/17/15 0508 11/18/15 0410 11/18/15 2201 11/19/15 0248 11/19/15 1129 11/19/15 2130 11/20/15 0823  NA 136 136  137 137 137  --  137 136  K 4.2 3.7  3.7 4.0 4.1  --  4.1 4.3  CL 99* 98*  98* 99* 99*  --  98* 95*  CO2 24 26  26 27 27   --  27 27  GLUCOSE 93 104*  105* 114* 102*  --  111* 90  BUN 52* 38*  38* 28* 31*  --  40* 45*  CREATININE 6.03* 5.48*  5.54* 4.91* 5.17* 5.69* 6.26* 6.47*  CALCIUM 9.3 9.1  9.1 8.9 9.0  --  9.1 9.3  PHOS 4.5 3.6 3.3  --   --  4.1 4.7*   Liver  Function Tests:  Recent Labs Lab 11/17/15 0508 11/18/15 0410 11/18/15 2201 11/19/15 2130 11/20/15 0823  ALBUMIN 3.5 3.5 3.4* 3.2* 3.4*   Coagulation Profile:  Recent Labs Lab 11/16/15 0533 11/17/15 0508 11/18/15 0410 11/19/15 0248 11/20/15 0823  INR 2.27* 1.95* 1.70* 1.64*  1.44   Urine analysis:    Component Value Date/Time   COLORURINE YELLOW 11/10/2015 1800   APPEARANCEUR CLEAR 11/10/2015 1800   LABSPEC 1.012 11/10/2015 1800   PHURINE 5.5 11/10/2015 1800   GLUCOSEU NEGATIVE 11/10/2015 1800   GLUCOSEU NEGATIVE 05/05/2015 1135   HGBUR NEGATIVE 11/10/2015 1800   HGBUR negative 09/21/2009 1008   BILIRUBINUR NEGATIVE 11/10/2015 1800   KETONESUR NEGATIVE 11/10/2015 1800   PROTEINUR NEGATIVE 11/10/2015 1800   UROBILINOGEN 0.2 05/05/2015 1135   NITRITE NEGATIVE 11/10/2015 1800   LEUKOCYTESUR NEGATIVE 11/10/2015 1800    Recent Results (from the past 240 hour(s))  MRSA PCR Screening     Status: None   Collection Time: 11/10/15  8:50 PM  Result Value Ref Range Status   MRSA by PCR NEGATIVE NEGATIVE Final    Radiology Studies: Dg Chest 2 View  11/20/2015  CLINICAL DATA:  Pleural effusion.  Shortness of breath.  Chest pain. EXAM: CHEST  2 VIEW COMPARISON:  11/12/2015 chest radiograph. FINDINGS: Right internal jugular central venous catheter terminates in the right upper mediastinum, unchanged. Stable cardiomediastinal silhouette with mild cardiomegaly. No pneumothorax. Moderate right pleural effusion is probably unchanged accounting for differences in inspiration. No left pleural effusion. No pulmonary edema. Patchy right lower lung opacity, not appreciably changed. IMPRESSION: 1. Stable mild cardiomegaly without pulmonary edema. 2. Stable moderate right pleural effusion. 3. Stable patchy right lower lung opacity, favor atelectasis. Electronically Signed   By: Ilona Sorrel M.D.   On: 11/20/2015 14:14   Ct Chest Wo Contrast  11/12/2015  CLINICAL DATA:  Shortness of breath.  Wheezing. Stage 4 chronic kidney disease. Pleural effusion. EXAM: CT CHEST WITHOUT CONTRAST TECHNIQUE: Multidetector CT imaging of the chest was performed following the standard protocol without IV contrast. COMPARISON:  11/10/2015 and 08/18/2015 FINDINGS: Mediastinum/Nodes: Prominent contour systemic venous structures. Cardiomegaly, especially involving the right atrium. Low-density along the medial margin of the right auricle as on image 64 series 2, I am uncertain whether this represents the dentated margin or a filling defect such as thrombus. Descending aortic aneurysm, 4.7 cm in the posterior arch. Scattered small lymph nodes. Flattened and nearly occluded appearance of the right mainstem bronchus and bronchus intermedius. There is some loculation of the right pleural effusion along the right anterior mediastinal border. Lungs/Pleura: Large right pleural effusion filling about 70% of the right hemithorax, with associated atelectasis of much of the right lung. Suspected loculated components. There is mild atelectasis in the left lower lobe with airway thickening and near occlusion of left lower lobe segmental bronchi. Upper abdomen: Part of the right posterior thoracic cavity is excluded as it extends well below the left side. Trace perisplenic ascites. Musculoskeletal: Considerable thoracic spondylosis and degenerative disc disease. Bilateral gynecomastia. Diffuse subcutaneous edema. IMPRESSION: 1. Prominent systemic venous structures favoring elevated right heart pressures. Prominent right atrial enlargement and moderate enlargement of the rest of the heart. 2. I am unsure if the low-density posteriorly into the right in the right auricle represents the dentate margin of the right auricle or thrombus. Consider echocardiography with attention to this region. 3. Large right pleural effusion with extensive passive atelectasis. Loculated component of the pleural effusion anteriorly. 4. Prominent airway thickening  and near collapse in the right mainstem bronchus, right bronchus intermedius, and in the left lower lobe bronchi. This raises suspicion for the possibility of bronchomalacia. 5. Diffuse subcutaneous edema and mild perisplenic ascites, likely due to diffuse third spacing of fluid. There is also bilateral gynecomastia. 6. Stable aneurysmal thoracic  aorta, measuring up to 4.7 cm along the posterior arch. Electronically Signed   By: Van Clines M.D.   On: 11/12/2015 17:00   Dg Chest Port 1 View  11/12/2015  CLINICAL DATA:  Central line placement EXAM: PORTABLE CHEST 1 VIEW COMPARISON:  Multiple exams, including 11/10/2015 FINDINGS: Right IJ line tip is in the lower internal jugular vein, immediately above the brachiocephalic confluence. If SVC placement is desired consider advancing 3 cm. Stable large right pleural effusion with passive atelectasis. Stable cardiomegaly. IMPRESSION: 1. Right IJ line tip is projects in the lower right internal jugular vein, just above the brachiocephalic confluence. If SVC placement is desired, advanced 3 cm. 2. Stable large right pleural effusion and stable cardiomegaly. Electronically Signed   By: Van Clines M.D.   On: 11/12/2015 18:34   Dg Chest Port 1 View  11/10/2015  CLINICAL DATA:  Short of breath for 4 day EXAM: PORTABLE CHEST 1 VIEW COMPARISON:  08/19/2015 FINDINGS: The heart is moderately enlarged with a globular appearance. Moderate right pleural effusion and associated pulmonary opacity in the right mid and lower lung zones has developed. There is no pneumothorax. There is no pulmonary edema. There is no pneumothorax or vascular congestion. IMPRESSION: Right pleural effusion and basilar pulmonary opacity. Cardiomegaly is noted. Electronically Signed   By: Marybelle Killings M.D.   On: 11/10/2015 16:12      Scheduled Meds: . allopurinol  200 mg Oral Daily  . darbepoetin (ARANESP) injection - NON-DIALYSIS  100 mcg Subcutaneous Q Wed-1800  . fluticasone  2  spray Each Nare Daily  . heparin  5,000 Units Subcutaneous Q8H  . hydroxychloroquine  200 mg Oral BID  . isosorbide mononitrate  30 mg Oral Daily  . multivitamin with minerals  1 tablet Oral Daily  . pantoprazole  40 mg Oral BID  . sevelamer carbonate  800 mg Oral TID WC  . sodium chloride flush  3 mL Intravenous Q12H  . sodium chloride flush  3 mL Intravenous Q12H   Continuous Infusions:    LOS: 10 days   Time spent: 20 minutes   Elwin Mocha, MD [facility] Pager [phone]  If 7PM-7AM, please contact night-coverage [email or web address] Password [facility] 11/20/2015, 6:24 PM

## 2015-11-20 NOTE — Progress Notes (Signed)
Can start at [facility] on May 9th at 11:30.Days Tuesday,Thursday and Saturday 2nd shift .pending on Nephrologist approval

## 2015-11-20 NOTE — Progress Notes (Signed)
Tolerating HD.  Using TC.  WIll need to speak with VVS regarding a definitive treatment for his access failure. Juhi Lagrange C

## 2015-11-20 NOTE — Procedures (Signed)
On HD via temp cath.  Remains SOB.  Fluid removal significant.  Will ask about thoracentesis and ck CXR. Bruce Cole C

## 2015-11-20 NOTE — Progress Notes (Signed)
  Volume managed with HD. No active cardiac issues at this point. Will sign off.   Please call with questions.   Henna Derderian,MD 11:45 AM

## 2015-11-21 LAB — RENAL FUNCTION PANEL
ANION GAP: 12 (ref 5–15)
Albumin: 3.1 g/dL — ABNORMAL LOW (ref 3.5–5.0)
BUN: 39 mg/dL — AB (ref 6–20)
CALCIUM: 9.2 mg/dL (ref 8.9–10.3)
CHLORIDE: 97 mmol/L — AB (ref 101–111)
CO2: 26 mmol/L (ref 22–32)
CREATININE: 6.37 mg/dL — AB (ref 0.61–1.24)
GFR calc Af Amer: 9 mL/min — ABNORMAL LOW (ref >60)
GFR, EST NON AFRICAN AMERICAN: 8 mL/min — AB (ref >60)
Glucose, Bld: 94 mg/dL (ref 65–99)
POTASSIUM: 3.9 mmol/L (ref 3.5–5.1)
Phosphorus: 3.5 mg/dL (ref 2.5–4.6)
Sodium: 135 mmol/L (ref 135–145)

## 2015-11-21 LAB — CBC
HCT: 23.4 % — ABNORMAL LOW (ref 39.0–52.0)
Hemoglobin: 7 g/dL — ABNORMAL LOW (ref 13.0–17.0)
MCH: 25.9 pg — ABNORMAL LOW (ref 26.0–34.0)
MCHC: 29.9 g/dL — ABNORMAL LOW (ref 30.0–36.0)
MCV: 86.7 fL (ref 78.0–100.0)
PLATELETS: 102 10*3/uL — AB (ref 150–400)
RBC: 2.7 MIL/uL — ABNORMAL LOW (ref 4.22–5.81)
RDW: 17.7 % — AB (ref 11.5–15.5)
WBC: 7.6 10*3/uL (ref 4.0–10.5)

## 2015-11-21 LAB — PROTIME-INR
INR: 1.42 (ref 0.00–1.49)
Prothrombin Time: 17.4 seconds — ABNORMAL HIGH (ref 11.6–15.2)

## 2015-11-21 LAB — PREPARE RBC (CROSSMATCH)

## 2015-11-21 MED ORDER — ALTEPLASE 2 MG IJ SOLR: 2.0000 mg | Freq: Once | INTRAMUSCULAR | Status: DC | PRN

## 2015-11-21 MED ORDER — HEPARIN SODIUM (PORCINE) 1000 UNIT/ML DIALYSIS: 20.0000 [IU]/kg | INTRAMUSCULAR | Status: DC | PRN

## 2015-11-21 MED ORDER — SODIUM CHLORIDE 0.9 % IV SOLN: 100.0000 mL | INTRAVENOUS | Status: DC | PRN

## 2015-11-21 MED ORDER — LIDOCAINE-PRILOCAINE 2.5-2.5 % EX CREA
1.0000 "application " | TOPICAL_CREAM | CUTANEOUS | Status: DC | PRN
  Filled 2015-11-21: qty 5

## 2015-11-21 MED ORDER — LIDOCAINE HCL (PF) 1 % IJ SOLN: 5.0000 mL | INTRAMUSCULAR | Status: DC | PRN

## 2015-11-21 MED ORDER — FUROSEMIDE 10 MG/ML IJ SOLN
20.0000 mg | Freq: Once | INTRAMUSCULAR | Status: AC | PRN
  Administered 2015-11-22: 20 mg via INTRAVENOUS
  Filled 2015-11-21: qty 2

## 2015-11-21 MED ORDER — SENNA 8.6 MG PO TABS
1.0000 | ORAL_TABLET | Freq: Every day | ORAL | Status: DC | PRN
  Administered 2015-11-21 – 2015-11-23 (×3): 8.6 mg via ORAL
  Filled 2015-11-21 (×4): qty 1

## 2015-11-21 MED ORDER — SODIUM CHLORIDE 0.9 % IV SOLN: Freq: Once | INTRAVENOUS | Status: AC

## 2015-11-21 MED ORDER — PENTAFLUOROPROP-TETRAFLUOROETH EX AERO: 1.0000 "application " | INHALATION_SPRAY | CUTANEOUS | Status: DC | PRN

## 2015-11-21 MED ORDER — SODIUM CHLORIDE 0.9 % IV SOLN
Freq: Once | INTRAVENOUS | Status: AC
  Administered 2015-11-21: 22:00:00 via INTRAVENOUS

## 2015-11-21 MED ORDER — CARVEDILOL 3.125 MG PO TABS
3.1250 mg | ORAL_TABLET | Freq: Every day | ORAL | Status: DC
  Administered 2015-11-21 – 2015-11-25 (×4): 3.125 mg via ORAL
  Filled 2015-11-21 (×5): qty 1

## 2015-11-21 MED ORDER — FENTANYL CITRATE (PF) 100 MCG/2ML IJ SOLN
50.0000 ug | Freq: Once | INTRAMUSCULAR | Status: AC | PRN
  Administered 2015-11-22: 50 ug via INTRAVENOUS
  Filled 2015-11-21: qty 2

## 2015-11-21 MED ORDER — ACETAMINOPHEN 325 MG PO TABS
650.0000 mg | ORAL_TABLET | Freq: Once | ORAL | Status: AC
  Administered 2015-11-21: 650 mg via ORAL
  Filled 2015-11-21: qty 2

## 2015-11-21 MED ORDER — HEPARIN SODIUM (PORCINE) 1000 UNIT/ML DIALYSIS: 1000.0000 [IU] | INTRAMUSCULAR | Status: DC | PRN

## 2015-11-21 NOTE — Progress Notes (Signed)
    Subjective  -   No acute events   Physical Exam:  L AVF witht thrill Alert and oriented.       Assessment/Plan:    Plan for left B-C fistula vs BVT on Monday.  Will also convert temp cath to permanent NPO after midnight on Sunday  Bruce Cole, [name] 11/21/2015 9:46 AM --  Filed Vitals:   11/20/15 2123 11/21/15 0500  BP: 101/62 105/62  Pulse: 59 67  Temp: 98.4 F (36.9 C) 98.5 F (36.9 C)  Resp: 18 18    Intake/Output Summary (Last 24 hours) at 11/21/15 0946 Last data filed at 11/21/15 0800  Gross per 24 hour  Intake    420 ml  Output   5920 ml  Net  -5500 ml     Laboratory CBC    Component Value Date/Time   WBC 7.0 11/20/2015 0823   HGB 7.3* 11/20/2015 0823   HCT 23.6* 11/20/2015 0823   PLT 100* 11/20/2015 0823    BMET    Component Value Date/Time   NA 136 11/20/2015 0823   K 4.3 11/20/2015 0823   CL 95* 11/20/2015 0823   CO2 27 11/20/2015 0823   GLUCOSE 90 11/20/2015 0823   BUN 45* 11/20/2015 0823   CREATININE 6.47* 11/20/2015 0823   CREATININE 1.77* 02/13/2013 1505   CALCIUM 9.3 11/20/2015 0823   GFRNONAA 8* 11/20/2015 0823   GFRAA 9* 11/20/2015 0823    COAG Lab Results  Component Value Date   INR 1.42 11/21/2015   INR 1.44 11/20/2015   INR 1.64* 11/19/2015   No results found for: PTT  Antibiotics Anti-infectives    Start     Dose/Rate Route Frequency Ordered Stop   11/10/15 2200  hydroxychloroquine (PLAQUENIL) tablet 200 mg     20 0 mg Oral 2 times daily 11/10/15 2134         V. Leia Alf, M.D. [facility] Office: [phone] Pager:  [phone]

## 2015-11-21 NOTE — Progress Notes (Signed)
Assessment/Plan:  1. ESRD, new start   2. S/p ABLA- due to gastritis in setting of anticoagulation. Transfused PRBC's . 3. CHF/Volume overload- cont with UF with HD---improved. 4. Vascular access- for new AVF and [facility] Monday 5. A fib on coumadin-  Coumadin on hold for GIB and pending VVS intervention. 6. Right Pleural effusion  Plan HD again for volume today.          Pulmonary to see regarding pl effusion  Subjective: Interval History: Wgt down  Objective: Vital signs in last 24 hours: Temp:  [98 F (36.7 C)-98.7 F (37.1 C)] 98.5 F (36.9 C) (05/06 0500) Pulse Rate:  [59-68] 67 (05/06 0500) Resp:  [16-25] 18 (05/06 0500) BP: (98-105)/(54-70) 105/62 mmHg (05/06 0500) SpO2:  [91 %-100 %] 91 % (05/06 0500) Weight:  [107 kg (235 lb 14.3 oz)-108.183 kg (238 lb 8 oz)] 108.183 kg (238 lb 8 oz) (05/06 0500) Weight change: 0.208 kg (7.3 oz)  Intake/Output from previous day: 05/05 0701 - 05/06 0700 In: 180 [P.O.:180] Out: 5920 [Urine:220] Intake/Output this shift: Total I/O In: 240 [P.O.:240] Out: -   General appearance: alert and cooperative Resp: dullness to percussion RLL Cardio: regular rate and rhythm, S1, S2 normal, no murmur, click, rub or gallop Extremities: edema less , 2+  Lab Results:  Recent Labs  11/19/15 2130 11/20/15 0823  WBC 7.3 7.0  HGB 7.3* 7.3*  HCT 23.9* 23.6*  PLT 90* 100*   BMET:  Recent Labs  11/19/15 2130 11/20/15 0823  NA 137 136  K 4.1 4.3  CL 98* 95*  CO2 27 27  GLUCOSE 111* 90  BUN 40* 45*  CREATININE 6.26* 6.47*  CALCIUM 9.1 9.3   No results for input(s): PTH in the last 72 hours. Iron Studies: No results for input(s): IRON, TIBC, TRANSFERRIN, FERRITIN in the last 72 hours. Studies/Results: Dg Chest 2 View  11/20/2015  CLINICAL DATA:  Pleural effusion.  Shortness of breath.  Chest pain. EXAM: CHEST  2 VIEW COMPARISON:  11/12/2015 chest radiograph. FINDINGS: Right internal jugular central venous catheter terminates in  the right upper mediastinum, unchanged. Stable cardiomediastinal silhouette with mild cardiomegaly. No pneumothorax. Moderate right pleural effusion is probably unchanged accounting for differences in inspiration. No left pleural effusion. No pulmonary edema. Patchy right lower lung opacity, not appreciably changed. IMPRESSION: 1. Stable mild cardiomegaly without pulmonary edema. 2. Stable moderate right pleural effusion. 3. Stable patchy right lower lung opacity, favor atelectasis. Electronically Signed   By: Ilona Sorrel M.D.   On: 11/20/2015 14:14    Scheduled: . allopurinol  200 mg Oral Daily  . darbepoetin (ARANESP) injection - NON-DIALYSIS  100 mcg Subcutaneous Q Wed-1800  . fluticasone  2 spray Each Nare Daily  . heparin  5,000 Units Subcutaneous Q8H  . hydroxychloroquine  200 mg Oral BID  . isosorbide mononitrate  30 mg Oral Daily  . multivitamin with minerals  1 tablet Oral Daily  . pantoprazole  40 mg Oral BID  . sevelamer carbonate  800 mg Oral TID WC  . sodium chloride flush  3 mL Intravenous Q12H  . sodium chloride flush  3 mL Intravenous Q12H    LOS: 11 days   Bruce Cole C 11/21/2015,9:06 AM

## 2015-11-21 NOTE — Progress Notes (Signed)
Patient ID: JAYSHAWN BUREK, male   DOB: 04/20/1941, 75 y.o.   MRN: [id number]    PROGRESS NOTE    RAFI SELIGA  [id number] DOB: 04/10/1941 DOA: 11/10/2015  PCP: Nance Pear., NP   Outpatient Specialists:   Brief Narrative:  75 y.o. male with rheumatoid arthritis, chronic atrial fibrillation on warfarin, chronic systolic CHF, and chronic kidney disease stage IV who presented to the ED at [facility] for evaluation of dyspnea and malaise 4 days duration. Patient has been followed closely by outpatient cardiologist and nephrologist and had AV fistula created in the left upper extremity on 08/26/2015, but has not yet required dialysis.  ED Course: Upon arrival to the [facility] ED, Chest x-ray demonstrated a right sided pleural effusion with an associated opacity in the right base. Transferred to [facility] for admission.  Major events: 4/27 - more dyspneic in the afternoon 4 pm, ordered CXR as pt unable to tolerate CT, PCCM consulted, ABG pend, also call nephrology team again to consider HD sooner than expected 5/4 - pt reports feeling better but still has questions, offered to answer questions, he said he wants to rest and he needs to speak with nephrologist  5/5 - patient still wanting to know what the course of his care will be but is very reluctant to speak to physicians 5/6 - IR thoracentesis tomorrow  Assessment & Plan:   1. GI bleed with symptomatic anemia  - 2 units pRBCs ordered for transfusion on admission; 6.5 on admit - EGD done 4/28, notable for mild gastritis and per GI may cause heme positive stool in face of over [facility] - otherwise normal EGD - continue PPI for upper GI mucosal protection  - H&H unstable, will need repeat transfusion to optimize pt for procedures  2. Chronic atrial fibrillation with supratherapeutic INR, bradycardia this AM - CHADS-VASc 3 (age, HTN, CH) - INR trending down: 5.03 --> 3.8 --> 1.70 --> 1.64-->1.42 - per cardiology,  still holding coreg - resumed coreg today - pt is on Imdur only for now - cardiology's was appreciated, signed off 5/5  3. AKI on CKD IV --> ESRD now on HD - SCr is 5.93 on admission, up from 3.3 on 10/27/15  - Nephrology team following  - AVF in LUE created on 08/26/15 - s/p fistulogram 5/4, to [place] with vascular surgery on 5/8  4. Chronic systolic CHF/Volume overload  - currently receiving 160 mg IV lasix BID with poor urine output - TTE (08/18/15) with EF 40-45%  - Wt is 265 on admission --> 275  --> 272  --> 262  --> 254 -- 250 --> 250-->235-->233 lbs this AM - HD per nephrology  - pt still volume overloaded but better overall   5. Hypertension, essential - currently on Imdur and BP has been reasonably stable   6. Rheumatoid arthritis  - Stable  - Continue home dose Plaquenil   7. Right pleural effusion  - CT chest confirms right pleural effusion with extensive passive atelectasis, loculated component of the pleural effusion  - Pulmonary has declined tap, now has advised interventional radiology consult, order has been placed - Patient's INR is now at goal for A pleural effusion -Patient agreeable to procedure but is requesting pain relief, when necessary fentanyl preprocedure ordered  8. Prolonged QTc, resolved - QTc 555 ms on admission, repeat EKG done on 5/5 shows resolution - Monitor on telemetry   9. Obesity  - Body mass index is 34.15 kg/(m^2).  10.  Thrombocytopenia - no signs of bleeding  - slightly better this AM - CBC in AM  DVT prophylaxis: SCD's for now  Code Status: Full Family Communication: Patient at bedside, I offered to answer any questions of several occasions and pt has replied I am not the right person, he wants to speak with Dr. [name]. I also asked if he wants me to call his family and he has just closed his eyes and said he needs to rest. I called phone number in the system (his daughter) but unclear if that is the right number. I left a message  to call me back, awaiting response.  Disposition Plan: per nephrology team   Consultants:   Nephrology  Cardiology   GI  PCCM  Vascular   Procedures:   EGD 4/28 - mild gastritis, continue with PPI  HD cath 4/28   Fistulogram 5/4   Antimicrobials:  None   Subjective: Reports feeling better this AM but upset and he said his questions have not been answered.   Objective: Filed Vitals:   11/21/15 1558 11/21/15 1626 11/21/15 1651 11/21/15 1700  BP: 105/68 99/60 107/62 105/67  Pulse: 60 58 58 58  Temp:    98.2 F (36.8 C)  TempSrc:    Oral  Resp:    18  Height:      Weight:    105.8 kg (233 lb 4 oz)  SpO2:    98%    Intake/Output Summary (Last 24 hours) at 11/21/15 1905 Last data filed at 11/21/15 1811  Gross per 24 hour  Intake    660 ml  Output   3625 ml  Net  -2965 ml   Filed Weights   11/21/15 0500 11/21/15 1330 11/21/15 1700  Weight: 108.183 kg (238 lb 8 oz) 108.7 kg (239 lb 10.2 oz) 105.8 kg (233 lb 4 oz)    Examination: General: Calm, alert, NAD Cardiac: Regular rate and rhythm no murmurs rubs gallops Pulmonary: Normal respiratory phase, no wheezes rales or rhonchi Muscle skeletal: Moving all extremities, 2+ edema noted  Data Reviewed: I have personally reviewed following labs and imaging studies  CBC:  Recent Labs Lab 11/19/15 0248 11/19/15 1129 11/19/15 2130 11/20/15 0823 11/21/15 1409  WBC 7.7 7.4 7.3 7.0 7.6  HGB 7.4* 7.3* 7.3* 7.3* 7.0*  HCT 24.3* 24.4* 23.9* 23.6* 23.4*  MCV 91.0 89.4 89.2 89.1 86.7  PLT 79* 89* 90* 100* [id number]*   Basic Metabolic Panel:  Recent Labs Lab 11/18/15 0410 11/18/15 2201 11/19/15 0248 11/19/15 1129 11/19/15 2130 11/20/15 0823 11/21/15 1409  NA 136  137 137 137  --  137 136 135  K 3.7  3.7 4.0 4.1  --  4.1 4.3 3.9  CL 98*  98* 99* 99*  --  98* 95* 97*  CO2 26  26 27 27   --  27 27 26   GLUCOSE 104*  105* 114* 102*  --  111* 90 94  BUN 38*  38* 28* 31*  --  40* 45* 39*  CREATININE 5.48*   5.54* 4.91* 5.17* 5.69* 6.26* 6.47* 6.37*  CALCIUM 9.1  9.1 8.9 9.0  --  9.1 9.3 9.2  PHOS 3.6 3.3  --   --  4.1 4.7* 3.5   Liver Function Tests:  Recent Labs Lab 11/18/15 0410 11/18/15 2201 11/19/15 2130 11/20/15 0823 11/21/15 1409  ALBUMIN 3.5 3.4* 3.2* 3.4* 3.1*   Coagulation Profile:  Recent Labs Lab 11/17/15 0508 11/18/15 0410 11/19/15 0248 11/20/15 0823 11/21/15 0301  INR  1.95* 1.70* 1.64* 1.44 1.42   Urine analysis:    Component Value Date/Time   COLORURINE YELLOW 11/10/2015 1800   APPEARANCEUR CLEAR 11/10/2015 1800   LABSPEC 1.012 11/10/2015 1800   PHURINE 5.5 11/10/2015 1800   GLUCOSEU NEGATIVE 11/10/2015 1800   GLUCOSEU NEGATIVE 05/05/2015 1135   HGBUR NEGATIVE 11/10/2015 1800   HGBUR negative 09/21/2009 1008   BILIRUBINUR NEGATIVE 11/10/2015 1800   KETONESUR NEGATIVE 11/10/2015 1800   PROTEINUR NEGATIVE 11/10/2015 1800   UROBILINOGEN 0.2 05/05/2015 1135   NITRITE NEGATIVE 11/10/2015 1800   LEUKOCYTESUR NEGATIVE 11/10/2015 1800    Recent Results (from the past 240 hour(s))  MRSA PCR Screening     Status: None   Collection Time: 11/10/15  8:50 PM  Result Value Ref Range Status   MRSA by PCR NEGATIVE NEGATIVE Final    Radiology Studies: Dg Chest 2 View  11/20/2015  CLINICAL DATA:  Pleural effusion.  Shortness of breath.  Chest pain. EXAM: CHEST  2 VIEW COMPARISON:  11/12/2015 chest radiograph. FINDINGS: Right internal jugular central venous catheter terminates in the right upper mediastinum, unchanged. Stable cardiomediastinal silhouette with mild cardiomegaly. No pneumothorax. Moderate right pleural effusion is probably unchanged accounting for differences in inspiration. No left pleural effusion. No pulmonary edema. Patchy right lower lung opacity, not appreciably changed. IMPRESSION: 1. Stable mild cardiomegaly without pulmonary edema. 2. Stable moderate right pleural effusion. 3. Stable patchy right lower lung opacity, favor atelectasis. Electronically  Signed   By: Ilona Sorrel M.D.   On: 11/20/2015 14:14   Ct Chest Wo Contrast  11/12/2015  CLINICAL DATA:  Shortness of breath. Wheezing. Stage 4 chronic kidney disease. Pleural effusion. EXAM: CT CHEST WITHOUT CONTRAST TECHNIQUE: Multidetector CT imaging of the chest was performed following the standard protocol without IV contrast. COMPARISON:  11/10/2015 and 08/18/2015 FINDINGS: Mediastinum/Nodes: Prominent contour systemic venous structures. Cardiomegaly, especially involving the right atrium. Low-density along the medial margin of the right auricle as on image 64 series 2, I am uncertain whether this represents the dentated margin or a filling defect such as thrombus. Descending aortic aneurysm, 4.7 cm in the posterior arch. Scattered small lymph nodes. Flattened and nearly occluded appearance of the right mainstem bronchus and bronchus intermedius. There is some loculation of the right pleural effusion along the right anterior mediastinal border. Lungs/Pleura: Large right pleural effusion filling about 70% of the right hemithorax, with associated atelectasis of much of the right lung. Suspected loculated components. There is mild atelectasis in the left lower lobe with airway thickening and near occlusion of left lower lobe segmental bronchi. Upper abdomen: Part of the right posterior thoracic cavity is excluded as it extends well below the left side. Trace perisplenic ascites. Musculoskeletal: Considerable thoracic spondylosis and degenerative disc disease. Bilateral gynecomastia. Diffuse subcutaneous edema. IMPRESSION: 1. Prominent systemic venous structures favoring elevated right heart pressures. Prominent right atrial enlargement and moderate enlargement of the rest of the heart. 2. I am unsure if the low-density posteriorly into the right in the right auricle represents the dentate margin of the right auricle or thrombus. Consider echocardiography with attention to this region. 3. Large right pleural  effusion with extensive passive atelectasis. Loculated component of the pleural effusion anteriorly. 4. Prominent airway thickening and near collapse in the right mainstem bronchus, right bronchus intermedius, and in the left lower lobe bronchi. This raises suspicion for the possibility of bronchomalacia. 5. Diffuse subcutaneous edema and mild perisplenic ascites, likely due to diffuse third spacing of fluid. There is also bilateral gynecomastia.  6. Stable aneurysmal thoracic aorta, measuring up to 4.7 cm along the posterior arch. Electronically Signed   By: Van Clines M.D.   On: 11/12/2015 17:00   Dg Chest Port 1 View  11/12/2015  CLINICAL DATA:  Central line placement EXAM: PORTABLE CHEST 1 VIEW COMPARISON:  Multiple exams, including 11/10/2015 FINDINGS: Right IJ line tip is in the lower internal jugular vein, immediately above the brachiocephalic confluence. If SVC placement is desired consider advancing 3 cm. Stable large right pleural effusion with passive atelectasis. Stable cardiomegaly. IMPRESSION: 1. Right IJ line tip is projects in the lower right internal jugular vein, just above the brachiocephalic confluence. If SVC placement is desired, advanced 3 cm. 2. Stable large right pleural effusion and stable cardiomegaly. Electronically Signed   By: Van Clines M.D.   On: 11/12/2015 18:34   Dg Chest Port 1 View  11/10/2015  CLINICAL DATA:  Short of breath for 4 day EXAM: PORTABLE CHEST 1 VIEW COMPARISON:  08/19/2015 FINDINGS: The heart is moderately enlarged with a globular appearance. Moderate right pleural effusion and associated pulmonary opacity in the right mid and lower lung zones has developed. There is no pneumothorax. There is no pulmonary edema. There is no pneumothorax or vascular congestion. IMPRESSION: Right pleural effusion and basilar pulmonary opacity. Cardiomegaly is noted. Electronically Signed   By: Marybelle Killings M.D.   On: 11/10/2015 16:12      Scheduled Meds: .  allopurinol  200 mg Oral Daily  . darbepoetin (ARANESP) injection - NON-DIALYSIS  100 mcg Subcutaneous Q Wed-1800  . fluticasone  2 spray Each Nare Daily  . heparin  5,000 Units Subcutaneous Q8H  . hydroxychloroquine  200 mg Oral BID  . isosorbide mononitrate  30 mg Oral Daily  . multivitamin with minerals  1 tablet Oral Daily  . pantoprazole  40 mg Oral BID  . sevelamer carbonate  800 mg Oral TID WC  . sodium chloride flush  3 mL Intravenous Q12H  . sodium chloride flush  3 mL Intravenous Q12H   Continuous Infusions:    LOS: 11 days   Time spent: 20 minutes   Elwin Mocha, MD [facility] Pager [phone]  If 7PM-7AM, please contact night-coverage [email or web address] Password [email or web address] 11/21/2015, 7:05 PM

## 2015-11-21 NOTE — Progress Notes (Signed)
Nutrition Note  Received verbal consult from RN regarding diet education for patient since he is new to HD. Patient is in HD currently. Left Renal "Choose-a-Meal" and "Dining Out with Confidence" booklets at bedside with RD contact information. If patient has diet questions, please consult RD. Recommend patient follow up with RD at [facility] after d/c for further diet instructions.   Molli Barrows, RD, LDN, [place] Pager [phone] After Hours Pager [phone]

## 2015-11-22 ENCOUNTER — Inpatient Hospital Stay (HOSPITAL_COMMUNITY): Payer: Medicare Other

## 2015-11-22 LAB — BODY FLUID CELL COUNT WITH DIFFERENTIAL
EOS FL: 0 %
LYMPHS FL: 22 %
MONOCYTE-MACROPHAGE-SEROUS FLUID: 59 % (ref 50–90)
Neutrophil Count, Fluid: 19 % (ref 0–25)
Total Nucleated Cell Count, Fluid: 130 cu mm (ref 0–1000)

## 2015-11-22 LAB — HEMOGLOBIN AND HEMATOCRIT, BLOOD
HCT: 28.8 % — ABNORMAL LOW (ref 39.0–52.0)
Hemoglobin: 9.1 g/dL — ABNORMAL LOW (ref 13.0–17.0)

## 2015-11-22 LAB — GRAM STAIN

## 2015-11-22 LAB — PROTEIN, BODY FLUID: TOTAL PROTEIN, FLUID: 3.8 g/dL

## 2015-11-22 LAB — GLUCOSE, SEROUS FLUID: Glucose, Fluid: 99 mg/dL

## 2015-11-22 LAB — LACTATE DEHYDROGENASE, PLEURAL OR PERITONEAL FLUID: LD FL: 151 U/L — AB (ref 3–23)

## 2015-11-22 LAB — PROTIME-INR
INR: 1.38 (ref 0.00–1.49)
Prothrombin Time: 17.1 seconds — ABNORMAL HIGH (ref 11.6–15.2)

## 2015-11-22 MED ORDER — LIDOCAINE HCL (PF) 1 % IJ SOLN
INTRAMUSCULAR | Status: AC
  Administered 2015-11-22: 09:00:00
  Filled 2015-11-22: qty 10

## 2015-11-22 MED ORDER — RENA-VITE PO TABS
1.0000 | ORAL_TABLET | Freq: Every day | ORAL | Status: DC
  Administered 2015-11-22 – 2015-11-24 (×3): 1 via ORAL
  Filled 2015-11-22 (×3): qty 1

## 2015-11-22 MED ORDER — CEFUROXIME SODIUM 1.5 G IJ SOLR
1.5000 g | INTRAMUSCULAR | Status: AC
  Administered 2015-11-23: 1.5 g via INTRAVENOUS
  Filled 2015-11-22: qty 1.5

## 2015-11-22 MED ORDER — DEXTROSE 5 % IV SOLN: 1.5000 g | INTRAVENOUS | Status: DC

## 2015-11-22 MED ORDER — CEFAZOLIN SODIUM 1-5 GM-% IV SOLN: 1.0000 g | INTRAVENOUS | Status: DC

## 2015-11-22 NOTE — Progress Notes (Addendum)
Patient ID: Bruce Cole, male   DOB: 1940/11/22, 75 y.o.   MRN: [name]    PROGRESS NOTE    Bruce Cole  [name] DOB: Apr 11, 1941 DOA: 11/10/2015  PCP: Nance Pear., NP   Outpatient Specialists:   Brief Narrative:  75 y.o. male with rheumatoid arthritis, chronic atrial fibrillation on warfarin, chronic systolic CHF, and chronic kidney disease stage IV who presented to the ED at [facility] for evaluation of dyspnea and malaise 4 days duration. Patient has been followed closely by outpatient cardiologist and nephrologist and had AV fistula created in the left upper extremity on 08/26/2015, but has not yet required dialysis.  ED Course: Upon arrival to the [facility] ED, Chest x-ray demonstrated a right sided pleural effusion with an associated opacity in the right base. Transferred to [facility] for admission.  Major events: 4/27 - more dyspneic in the afternoon 4 pm, ordered CXR as pt unable to tolerate CT, PCCM consulted, ABG pend, also call nephrology team again to consider HD sooner than expected 5/4 - pt reports feeling better but still has questions, offered to answer questions, he said he wants to rest and he needs to speak with nephrologist  5/5 - patient still wanting to know what the course of his care will be but is very reluctant to speak to physicians 5/6 - IR thoracentesis tomorrow 5/7 - successful thoracentesis 1.4 L out, reconsulting GI  Assessment & Plan:   1. GI bleed with symptomatic anemia  - 2 units pRBCs ordered for transfusion on admission; 6.5 on admit - 2 units pRBCs ordered for transfusion 5/6, 7.0 >> 9.1hgb - EGD done 4/28, notable for mild gastritis and per GI may cause heme positive stool in face of over [facility] - otherwise normal EGD - continue PPI for upper GI mucosal protection  - spoke to GI, no further intervention warranted - pt may benefit from IV iron   2. Chronic atrial fibrillation with supratherapeutic INR,  bradycardia this AM - CHADS-VASc 3 (age, HTN, CH) - INR trending down: 5.03 --> 3.8 --> 1.70 --> 1.64-->1.42-->nl range - per cardiology, held coreg earlier in admit - resumed coreg 5/6 - pt is on Imdur - cardiology's was appreciated, signed off 5/5  3. AKI on CKD IV --> ESRD now on HD - SCr is 5.93 on admission, up from 3.3 on 10/27/15  - Nephrology team following  - AVF in LUE created on 08/26/15 - s/p fistulogram 5/4, to [place] with vascular surgery on 5/8  4. Chronic systolic CHF/Volume overload  - currently receiving 160 mg IV lasix BID with poor urine output - TTE (08/18/15) with EF 40-45%  - Wt is 265 on admission --> 275  --> 272  --> 262  --> 254 -- 250 --> 250-->235-->233 lbs this AM - HD per nephrology  - pt still volume overloaded but better overall   5. Hypertension, essential - currently on Imdur and BP has been reasonably stable   6. Rheumatoid arthritis  - Stable  - Continue home dose Plaquenil   7. Right pleural effusion  - CT chest confirms right pleural effusion with extensive passive atelectasis, loculated component of the pleural effusion. Pulmonary had originally agreed to do the thoracentesis but later declined. Patient's INR was ago for the procedure. Patient then began to refuse procedure due to the fact he felt there was no communication with him about possible nonmedically. The next day patient was agreeable to procedure. Procedure done on 5/71.4 L out.  Labs on fluid pending. Postprocedure chest x-ray is clear no acute processes.  8. Prolonged QTc, resolved - QTc 555 ms on admission, repeat EKG done on 5/5 shows resolution - Monitor on telemetry   9. Obesity  - Body mass index is 34.15 kg/(m^2).  10. Thrombocytopenia - no signs of bleeding  - CBC in AM  DVT prophylaxis: SCD's for now  Code Status: Full Family Communication: spoke to pt  Disposition Plan: per nephrology team   Consultants:   Nephrology  Cardiology   GI  PCCM  Vascular     Procedures:   EGD 4/28 - mild gastritis, continue with PPI  HD cath 4/28   Fistulogram 5/4   R U/S guided thoracentesis  Antimicrobials:  None   Subjective: Patient states his work of breathing is a lot better postprocedure. Patient denies any fever, nausea, vomiting, diarrhea, abdominal pain, chest pain. Patient feels he tolerated the procedure well.  Objective: Filed Vitals:   11/22/15 0313 11/22/15 0317 11/22/15 0353 11/22/15 0554  BP:  98/61 90/50 103/63  Pulse:   48 50  Temp:  98.2 F (36.8 C) 98.3 F (36.8 C) 98.6 F (37 C)  TempSrc:  Oral Oral Oral  Resp:  20  20  Height:      Weight: 106.55 kg (234 lb 14.4 oz)     SpO2:  100% 100% 100%    Intake/Output Summary (Last 24 hours) at 11/22/15 0830 Last data filed at 11/22/15 0545  Gross per 24 hour  Intake   1090 ml  Output   3525 ml  Net  -2435 ml   Filed Weights   11/21/15 1330 11/21/15 1700 11/22/15 0313  Weight: 108.7 kg (239 lb 10.2 oz) 105.8 kg (233 lb 4 oz) 106.55 kg (234 lb 14.4 oz)    Examination: General: Calm, alert, NAD Cardiac: Regular rate and rhythm no murmurs rubs gallops Pulmonary: Normal respiratory phase, no wheezes rales or rhonchi Muscle skeletal: Moving all extremities, 2+ edema noted  Data Reviewed: I have personally reviewed following labs and imaging studies  CBC:  Recent Labs Lab 11/19/15 0248 11/19/15 1129 11/19/15 2130 11/20/15 0823 11/21/15 1409 11/22/15 0725  WBC 7.7 7.4 7.3 7.0 7.6  --   HGB 7.4* 7.3* 7.3* 7.3* 7.0* 9.1*  HCT 24.3* 24.4* 23.9* 23.6* 23.4* 28.8*  MCV 91.0 89.4 89.2 89.1 86.7  --   PLT 79* 89* 90* 100* 102*  --    Basic Metabolic Panel:  Recent Labs Lab 11/18/15 0410 11/18/15 2201 11/19/15 0248 11/19/15 1129 11/19/15 2130 11/20/15 0823 11/21/15 1409  NA 136  137 137 137  --  137 136 135  K 3.7  3.7 4.0 4.1  --  4.1 4.3 3.9  CL 98*  98* 99* 99*  --  98* 95* 97*  CO2 26  26 27 27   --  27 27 26   GLUCOSE 104*  105* 114* 102*  --   111* 90 94  BUN 38*  38* 28* 31*  --  40* 45* 39*  CREATININE 5.48*  5.54* 4.91* 5.17* 5.69* 6.26* 6.47* 6.37*  CALCIUM 9.1  9.1 8.9 9.0  --  9.1 9.3 9.2  PHOS 3.6 3.3  --   --  4.1 4.7* 3.5   Liver Function Tests:  Recent Labs Lab 11/18/15 0410 11/18/15 2201 11/19/15 2130 11/20/15 0823 11/21/15 1409  ALBUMIN 3.5 3.4* 3.2* 3.4* 3.1*   Coagulation Profile:  Recent Labs Lab 11/18/15 0410 11/19/15 0248 11/20/15 [id number] 11/21/15 0301 11/22/15 0725  INR 1.70* 1.64* 1.44 1.42 1.38   Urine analysis:    Component Value Date/Time   COLORURINE YELLOW 11/10/2015 1800   APPEARANCEUR CLEAR 11/10/2015 1800   LABSPEC 1.012 11/10/2015 1800   PHURINE 5.5 11/10/2015 1800   GLUCOSEU NEGATIVE 11/10/2015 1800   GLUCOSEU NEGATIVE 05/05/2015 1135   HGBUR NEGATIVE 11/10/2015 1800   HGBUR negative 09/21/2009 1008   BILIRUBINUR NEGATIVE 11/10/2015 1800   KETONESUR NEGATIVE 11/10/2015 1800   PROTEINUR NEGATIVE 11/10/2015 1800   UROBILINOGEN 0.2 05/05/2015 1135   NITRITE NEGATIVE 11/10/2015 1800   LEUKOCYTESUR NEGATIVE 11/10/2015 1800    Recent Results (from the past 240 hour(s))  MRSA PCR Screening     Status: None   Collection Time: 11/10/15  8:50 PM  Result Value Ref Range Status   MRSA by PCR NEGATIVE NEGATIVE Final    Radiology Studies: Dg Chest 1 View  11/22/2015  CLINICAL DATA:  Status post right-sided thoracentesis EXAM: CHEST 1 VIEW COMPARISON:  Nov 20, 2015 FINDINGS: There has been interval resolution of right pleural effusion. No pneumothorax evident. Central catheter tip in superior vena cava. No edema or consolidation. Heart remains enlarged with pulmonary vascularity within normal limits. No adenopathy. IMPRESSION: Stable cardiomegaly. No appreciable edema or consolidation. No pneumothorax. No pleural effusion currently evident. Electronically Signed   By: Lowella Grip III M.D.   On: 11/22/2015 09:45   Dg Chest 2 View  11/20/2015  CLINICAL DATA:  Pleural effusion.   Shortness of breath.  Chest pain. EXAM: CHEST  2 VIEW COMPARISON:  11/12/2015 chest radiograph. FINDINGS: Right internal jugular central venous catheter terminates in the right upper mediastinum, unchanged. Stable cardiomediastinal silhouette with mild cardiomegaly. No pneumothorax. Moderate right pleural effusion is probably unchanged accounting for differences in inspiration. No left pleural effusion. No pulmonary edema. Patchy right lower lung opacity, not appreciably changed. IMPRESSION: 1. Stable mild cardiomegaly without pulmonary edema. 2. Stable moderate right pleural effusion. 3. Stable patchy right lower lung opacity, favor atelectasis. Electronically Signed   By: Ilona Sorrel M.D.   On: 11/20/2015 14:14   Ct Chest Wo Contrast  11/12/2015  CLINICAL DATA:  Shortness of breath. Wheezing. Stage 4 chronic kidney disease. Pleural effusion. EXAM: CT CHEST WITHOUT CONTRAST TECHNIQUE: Multidetector CT imaging of the chest was performed following the standard protocol without IV contrast. COMPARISON:  11/10/2015 and 08/18/2015 FINDINGS: Mediastinum/Nodes: Prominent contour systemic venous structures. Cardiomegaly, especially involving the right atrium. Low-density along the medial margin of the right auricle as on image 64 series 2, I am uncertain whether this represents the dentated margin or a filling defect such as thrombus. Descending aortic aneurysm, 4.7 cm in the posterior arch. Scattered small lymph nodes. Flattened and nearly occluded appearance of the right mainstem bronchus and bronchus intermedius. There is some loculation of the right pleural effusion along the right anterior mediastinal border. Lungs/Pleura: Large right pleural effusion filling about 70% of the right hemithorax, with associated atelectasis of much of the right lung. Suspected loculated components. There is mild atelectasis in the left lower lobe with airway thickening and near occlusion of left lower lobe segmental bronchi. Upper  abdomen: Part of the right posterior thoracic cavity is excluded as it extends well below the left side. Trace perisplenic ascites. Musculoskeletal: Considerable thoracic spondylosis and degenerative disc disease. Bilateral gynecomastia. Diffuse subcutaneous edema. IMPRESSION: 1. Prominent systemic venous structures favoring elevated right heart pressures. Prominent right atrial enlargement and moderate enlargement of the rest of the heart. 2. I am unsure if the low-density posteriorly  into the right in the right auricle represents the dentate margin of the right auricle or thrombus. Consider echocardiography with attention to this region. 3. Large right pleural effusion with extensive passive atelectasis. Loculated component of the pleural effusion anteriorly. 4. Prominent airway thickening and near collapse in the right mainstem bronchus, right bronchus intermedius, and in the left lower lobe bronchi. This raises suspicion for the possibility of bronchomalacia. 5. Diffuse subcutaneous edema and mild perisplenic ascites, likely due to diffuse third spacing of fluid. There is also bilateral gynecomastia. 6. Stable aneurysmal thoracic aorta, measuring up to 4.7 cm along the posterior arch. Electronically Signed   By: Van Clines M.D.   On: 11/12/2015 17:00   Dg Chest Port 1 View  11/12/2015  CLINICAL DATA:  Central line placement EXAM: PORTABLE CHEST 1 VIEW COMPARISON:  Multiple exams, including 11/10/2015 FINDINGS: Right IJ line tip is in the lower internal jugular vein, immediately above the brachiocephalic confluence. If SVC placement is desired consider advancing 3 cm. Stable large right pleural effusion with passive atelectasis. Stable cardiomegaly. IMPRESSION: 1. Right IJ line tip is projects in the lower right internal jugular vein, just above the brachiocephalic confluence. If SVC placement is desired, advanced 3 cm. 2. Stable large right pleural effusion and stable cardiomegaly. Electronically  Signed   By: Van Clines M.D.   On: 11/12/2015 18:34   Dg Chest Port 1 View  11/10/2015  CLINICAL DATA:  Short of breath for 4 day EXAM: PORTABLE CHEST 1 VIEW COMPARISON:  08/19/2015 FINDINGS: The heart is moderately enlarged with a globular appearance. Moderate right pleural effusion and associated pulmonary opacity in the right mid and lower lung zones has developed. There is no pneumothorax. There is no pulmonary edema. There is no pneumothorax or vascular congestion. IMPRESSION: Right pleural effusion and basilar pulmonary opacity. Cardiomegaly is noted. Electronically Signed   By: Marybelle Killings M.D.   On: 11/10/2015 16:12   US Thoracentesis Asp Pleural Space W/img Guide  11/22/2015  INDICATION: End-stage renal disease, CHF, dyspnea, recurrent right pleural effusion. Request made for diagnostic and therapeutic right thoracentesis. EXAM: ULTRASOUND GUIDED DIAGNOSTIC AND THERAPEUTIC RIGHT THORACENTESIS MEDICATIONS: None. COMPLICATIONS: None immediate. PROCEDURE: An ultrasound guided thoracentesis was thoroughly discussed with the patient and questions answered. The benefits, risks, alternatives and complications were also discussed. The patient understands and wishes to proceed with the procedure. Written consent was obtained. Ultrasound was performed to localize and mark an adequate pocket of fluid in the right chest. The area was then prepped and draped in the normal sterile fashion. 1% Lidocaine was used for local anesthesia. Under ultrasound guidance a Safe-T-Centesis catheter was introduced. Thoracentesis was performed. The catheter was removed and a dressing applied. FINDINGS: A total of approximately 1.4 liters of yellow fluid was removed. Samples were sent to the laboratory as requested by the clinical team. IMPRESSION: Successful ultrasound guided diagnostic and therapeutic right thoracentesis yielding 1.4 liters of pleural fluid. Read by: Rowe Robert, PA-C Electronically Signed   By: Aletta Edouard M.D.   On: 11/22/2015 10:13   Thoracenteisis guided by US - IMPRESSION: Successful ultrasound guided diagnostic and therapeutic right thoracentesis yielding 1.4 liters of pleural fluid.  Read by: Rowe Robert, PA-C (11/22/15)    Scheduled Meds: . allopurinol  200 mg Oral Daily  . carvedilol  3.125 mg Oral Daily  . [START ON 11/23/2015] cefUROXime (ZINACEF)  IV  1.5 g Intravenous To SS-Surg  . darbepoetin (ARANESP) injection - NON-DIALYSIS  100 mcg Subcutaneous Q Wed-1800  .  fluticasone  2 spray Each Nare Daily  . heparin  5,000 Units Subcutaneous Q8H  . hydroxychloroquine  200 mg Oral BID  . isosorbide mononitrate  30 mg Oral Daily  . multivitamin with minerals  1 tablet Oral Daily  . pantoprazole  40 mg Oral BID  . sevelamer carbonate  800 mg Oral TID WC  . sodium chloride flush  3 mL Intravenous Q12H  . sodium chloride flush  3 mL Intravenous Q12H   Continuous Infusions:    LOS: 12 days   Time spent: 20 minutes   Elwin Mocha, MD [facility] Pager [phone]  If 7PM-7AM, please contact night-coverage [email or web address] Password [email or web address] 11/22/2015, 8:30 AM

## 2015-11-22 NOTE — Progress Notes (Signed)
Assessment/Plan:  1. ESRD, new start  2. S/p ABLA- due to gastritis in setting of anticoagulation. Transfused PRBC's . 3. CHF/Volume overload- cont with UF with HD---improved. 4. Vascular access- for new AVF and [facility] Monday 5. A fib on coumadin-  Coumadin on hold for GIB and pending VVS intervention. 6. Right Pleural effusion s/p Thoracentesis 1.5L on 5/7  Plan HD again for volume Monday after [facility] and AVF.          Rehab consult for weakness, osteoarthritis.  Lives alone.    Subjective: Interval History: Thoracentesis to be done today.  Weight down.  Objective: Vital signs in last 24 hours: Temp:  [98.2 F (36.8 C)-98.8 F (37.1 C)] 98.6 F (37 C) (05/07 0554) Pulse Rate:  [48-60] 50 (05/07 0554) Resp:  [16-20] 20 (05/07 0554) BP: (80-116)/(48-68) 116/67 mmHg (05/07 0915) SpO2:  [98 %-100 %] 100 % (05/07 0554) Weight:  [105.8 kg (233 lb 4 oz)-106.55 kg (234 lb 14.4 oz)] 106.55 kg (234 lb 14.4 oz) (05/07 0313) Weight change: -4 kg (-8 lb 13.1 oz)  Intake/Output from previous day: 05/06 0701 - 05/07 0700 In: 1330 [P.O.:660; Blood:670] Out: 3525 [Urine:25] Intake/Output this shift: Total I/O In: 660 [P.O.:660] Out: -   General appearance: alert and cooperative Resp: diminished breath sounds RLL and dullness to percussion RLL Cardio: regular rate and rhythm, S1, S2 normal, no murmur, click, rub or gallop Extremities: edema 2-3+  Lab Results:  Recent Labs  11/20/15 0823 11/21/15 1409 11/22/15 0725  WBC 7.0 7.6  --   HGB 7.3* 7.0* 9.1*  HCT 23.6* 23.4* 28.8*  PLT 100* 102*  --    BMET:  Recent Labs  11/20/15 0823 11/21/15 1409  NA 136 135  K 4.3 3.9  CL 95* 97*  CO2 27 26  GLUCOSE 90 94  BUN 45* 39*  CREATININE 6.47* 6.37*  CALCIUM 9.3 9.2   No results for input(s): PTH in the last 72 hours. Iron Studies: No results for input(s): IRON, TIBC, TRANSFERRIN, FERRITIN in the last 72 hours. Studies/Results: Dg Chest 1 View  11/22/2015   CLINICAL DATA:  Status post right-sided thoracentesis EXAM: CHEST 1 VIEW COMPARISON:  Nov 20, 2015 FINDINGS: There has been interval resolution of right pleural effusion. No pneumothorax evident. Central catheter tip in superior vena cava. No edema or consolidation. Heart remains enlarged with pulmonary vascularity within normal limits. No adenopathy. IMPRESSION: Stable cardiomegaly. No appreciable edema or consolidation. No pneumothorax. No pleural effusion currently evident. Electronically Signed   By: Lowella Grip III M.D.   On: 11/22/2015 09:45   US Thoracentesis Asp Pleural Space W/img Guide  11/22/2015  INDICATION: End-stage renal disease, CHF, dyspnea, recurrent right pleural effusion. Request made for diagnostic and therapeutic right thoracentesis. EXAM: ULTRASOUND GUIDED DIAGNOSTIC AND THERAPEUTIC RIGHT THORACENTESIS MEDICATIONS: None. COMPLICATIONS: None immediate. PROCEDURE: An ultrasound guided thoracentesis was thoroughly discussed with the patient and questions answered. The benefits, risks, alternatives and complications were also discussed. The patient understands and wishes to proceed with the procedure. Written consent was obtained. Ultrasound was performed to localize and mark an adequate pocket of fluid in the right chest. The area was then prepped and draped in the normal sterile fashion. 1% Lidocaine was used for local anesthesia. Under ultrasound guidance a Safe-T-Centesis catheter was introduced. Thoracentesis was performed. The catheter was removed and a dressing applied. FINDINGS: A total of approximately 1.4 liters of yellow fluid was removed. Samples were sent to the laboratory as requested by the clinical team. IMPRESSION:  Successful ultrasound guided diagnostic and therapeutic right thoracentesis yielding 1.4 liters of pleural fluid. Read by: Rowe Robert, PA-C Electronically Signed   By: Aletta Edouard M.D.   On: 11/22/2015 10:13    Scheduled: . allopurinol  200 mg Oral  Daily  . carvedilol  3.125 mg Oral Daily  . [START ON 11/23/2015] cefUROXime (ZINACEF)  IV  1.5 g Intravenous To SS-Surg  . darbepoetin (ARANESP) injection - NON-DIALYSIS  100 mcg Subcutaneous Q Wed-1800  . fluticasone  2 spray Each Nare Daily  . heparin  5,000 Units Subcutaneous Q8H  . hydroxychloroquine  200 mg Oral BID  . isosorbide mononitrate  30 mg Oral Daily  . multivitamin with minerals  1 tablet Oral Daily  . pantoprazole  40 mg Oral BID  . sevelamer carbonate  800 mg Oral TID WC  . sodium chloride flush  3 mL Intravenous Q12H  . sodium chloride flush  3 mL Intravenous Q12H    LOS: 12 days   Trinitee Horgan C 11/22/2015,2:15 PM

## 2015-11-22 NOTE — Progress Notes (Addendum)
2315, pt reported dizziness and gas after receiving pm meds including coreg, plaquenil, protonix, senna and warm prune juice,  BP 102/50, hr 50, saturation 100% on room air.  Prior to blood adminstration, BP 80/51, HR 52, saturations remained 100%.  Blood started at 0000, 0015 - 15 minute follow up SBP 90, pt reports gas and abdominal pain.  Pt curretnly resting with no other complaints- Walden Field, NP notified.

## 2015-11-22 NOTE — Procedures (Signed)
Ultrasound-guided diagnostic and therapeutic right thoracentesis performed yielding 1.4 liters of yellow  fluid. No immediate complications. Follow-up chest x-ray pending.

## 2015-11-22 NOTE — Anesthesia Preprocedure Evaluation (Addendum)
Anesthesia Evaluation  Patient identified by MRN, date of birth, ID band Patient awake    Reviewed: Allergy & Precautions, H&P , NPO status , Patient's Chart, lab work & pertinent test results, reviewed documented beta blocker date and time   History of Anesthesia Complications Negative for: history of anesthetic complications  Airway Mallampati: III  TM Distance: >3 FB Neck ROM: full    Dental  (+) Poor Dentition, Chipped,    Pulmonary shortness of breath, sleep apnea ,  Right pleural effusion s/p paracentesis 5/7. 1.4L of fluid removed. SpO2 98% on RA this AM.   Pulmonary exam normal breath sounds clear to auscultation       Cardiovascular hypertension, Pt. on medications and Pt. on home beta blockers +CHF  + dysrhythmias Atrial Fibrillation  Rhythm:Irregular Rate:Bradycardia  Echo 1/17: Study Conclusions  - Left ventricle: The cavity size was normal. There was moderate concentric hypertrophy. Systolic function was mildly to moderately reduced. The estimated ejection fraction was in the range of 40% to 45%. Diffuse hypokinesis. - Ventricular septum: The contour showed diastolic flattening. - Aortic valve: Trileaflet; moderately thickened, moderately  calcified leaflets. - Aorta: Aortic root dimension: 39 mm (ED). - Aortic root: The aortic root was mildly dilated. - Mitral valve: Moderately calcified annulus. - Left atrium: The atrium was moderately dilated. - Right ventricle: The cavity size was severely dilated. Wall thickness was normal. Systolic function was mildly to moderately reduced. - Right atrium: The atrium was severely dilated. - Tricuspid valve: There was severe regurgitation. - Pulmonary arteries: Systolic pressure was mildly to moderately increased. PA peak pressure: 44 mm Hg (S).   Neuro/Psych negative neurological ROS  negative psych ROS   GI/Hepatic Neg liver ROS, Gastritis with likely UGIB.  Improved  per EGD report. Has received 8 pRBCs (6 on 11/21/15) during this hospitalization.   Endo/Other  diabetes  Renal/GU DialysisRenal disease     Musculoskeletal  (+) Arthritis ,   Abdominal   Peds  Hematology  (+) Blood dyscrasia, anemia ,   Anesthesia Other Findings Appears dyspnic at baselin  Reproductive/Obstetrics negative OB ROS                         Anesthesia Physical Anesthesia Plan  ASA: IV  Anesthesia Plan: General   Post-op Pain Management:    Induction: Intravenous  Airway Management Planned: Oral ETT  Additional Equipment:   Intra-op Plan:   Post-operative Plan: Extubation in OR  Informed Consent: I have reviewed the patients History and Physical, chart, labs and discussed the procedure including the risks, benefits and alternatives for the proposed anesthesia with the patient or authorized representative who has indicated his/her understanding and acceptance.   Dental advisory given  Plan Discussed with: CRNA  Anesthesia Plan Comments: (Risks/benefits of general anesthesia discussed with patient including risk of damage to teeth, lips, gum, and tongue, nausea/vomiting, allergic reactions to medications, and the possibility of heart attack, stroke and death.  All patient questions answered.  Patient wishes to proceed.)        Anesthesia Quick Evaluation

## 2015-11-22 NOTE — Progress Notes (Signed)
Patient scheduled for left arm fistula tomorrow by Dr. Donnetta Hutching.  He has had vein mapping prior to his original fistula placement, as well as a fistula duplex and fistulogram.  He should be a candidate for either a B-C fistula or BVT.  In addition, his temporary catheter will be converted to a new tunneled catheter vs a new catheter.  NPO after midnight   [name] Brabham

## 2015-11-23 ENCOUNTER — Other Ambulatory Visit: Payer: Self-pay | Admitting: Family

## 2015-11-23 ENCOUNTER — Inpatient Hospital Stay (HOSPITAL_COMMUNITY): Payer: Medicare Other | Admitting: Anesthesiology

## 2015-11-23 ENCOUNTER — Encounter (HOSPITAL_COMMUNITY): Payer: Self-pay | Admitting: Anesthesiology

## 2015-11-23 ENCOUNTER — Encounter (HOSPITAL_COMMUNITY)
Admission: EM | Disposition: A | Discharge status: Skilled Nursing Facility | Payer: Self-pay | Source: Home / Self Care | Attending: Internal Medicine

## 2015-11-23 ENCOUNTER — Other Ambulatory Visit (HOSPITAL_COMMUNITY): Payer: Self-pay | Admitting: Cardiology

## 2015-11-23 ENCOUNTER — Other Ambulatory Visit (HOSPITAL_COMMUNITY): Payer: Self-pay | Admitting: Internal Medicine

## 2015-11-23 ENCOUNTER — Inpatient Hospital Stay (HOSPITAL_COMMUNITY): Payer: Medicare Other

## 2015-11-23 DIAGNOSIS — N186 End stage renal disease: Secondary | ICD-10-CM

## 2015-11-23 DIAGNOSIS — D638 Anemia in other chronic diseases classified elsewhere: Secondary | ICD-10-CM

## 2015-11-23 HISTORY — PX: INSERTION OF DIALYSIS CATHETER: SHX1324

## 2015-11-23 HISTORY — PX: AV FISTULA PLACEMENT: SHX1204

## 2015-11-23 LAB — CBC
HCT: 28.8 % — ABNORMAL LOW (ref 39.0–52.0)
Hemoglobin: 9 g/dL — ABNORMAL LOW (ref 13.0–17.0)
MCH: 27 pg (ref 26.0–34.0)
MCHC: 31.3 g/dL (ref 30.0–36.0)
MCV: 86.5 fL (ref 78.0–100.0)
PLATELETS: 124 10*3/uL — AB (ref 150–400)
RBC: 3.33 MIL/uL — ABNORMAL LOW (ref 4.22–5.81)
RDW: 17.1 % — AB (ref 11.5–15.5)
WBC: 6.9 10*3/uL (ref 4.0–10.5)

## 2015-11-23 LAB — BASIC METABOLIC PANEL
Anion gap: 12 (ref 5–15)
BUN: 43 mg/dL — AB (ref 6–20)
CALCIUM: 9.3 mg/dL (ref 8.9–10.3)
CO2: 25 mmol/L (ref 22–32)
CREATININE: 6.3 mg/dL — AB (ref 0.61–1.24)
Chloride: 94 mmol/L — ABNORMAL LOW (ref 101–111)
GFR calc Af Amer: 9 mL/min — ABNORMAL LOW (ref >60)
GFR, EST NON AFRICAN AMERICAN: 8 mL/min — AB (ref >60)
GLUCOSE: 93 mg/dL (ref 65–99)
Potassium: 4.3 mmol/L (ref 3.5–5.1)
SODIUM: 131 mmol/L — AB (ref 135–145)

## 2015-11-23 LAB — MISC LABCORP TEST (SEND OUT): Labcorp test code: 88062

## 2015-11-23 LAB — TRIGLYCERIDES, BODY FLUIDS: Triglycerides, Fluid: 32 mg/dL

## 2015-11-23 LAB — PROTIME-INR
INR: 1.19 (ref 0.00–1.49)
PROTHROMBIN TIME: 15.3 s — AB (ref 11.6–15.2)

## 2015-11-23 LAB — PH, BODY FLUID: PH, BODY FLUID: 7.6

## 2015-11-23 SURGERY — ARTERIOVENOUS (AV) FISTULA CREATION
Anesthesia: General | Site: Neck | Laterality: Left

## 2015-11-23 MED ORDER — SUCCINYLCHOLINE CHLORIDE 20 MG/ML IJ SOLN
INTRAMUSCULAR | Status: DC | PRN
  Administered 2015-11-23: 120 mg via INTRAVENOUS

## 2015-11-23 MED ORDER — WARFARIN - PHARMACIST DOSING INPATIENT: Freq: Every day | Status: DC

## 2015-11-23 MED ORDER — PROPOFOL 10 MG/ML IV BOLUS
INTRAVENOUS | Status: DC | PRN
  Administered 2015-11-23: 150 mg via INTRAVENOUS

## 2015-11-23 MED ORDER — SODIUM CHLORIDE 0.9 % IV SOLN
125.0000 mg | INTRAVENOUS | Status: DC
  Administered 2015-11-24: 125 mg via INTRAVENOUS
  Filled 2015-11-23 (×2): qty 10

## 2015-11-23 MED ORDER — HEPARIN SODIUM (PORCINE) 5000 UNIT/ML IJ SOLN
INTRAMUSCULAR | Status: DC | PRN
  Administered 2015-11-23: 08:00:00

## 2015-11-23 MED ORDER — WARFARIN SODIUM 10 MG PO TABS
10.0000 mg | ORAL_TABLET | Freq: Once | ORAL | Status: AC
  Administered 2015-11-23: 10 mg via ORAL
  Filled 2015-11-23: qty 1

## 2015-11-23 MED ORDER — HEPARIN SODIUM (PORCINE) 1000 UNIT/ML IJ SOLN
INTRAMUSCULAR | Status: DC | PRN
  Administered 2015-11-23: 3.8 mL

## 2015-11-23 MED ORDER — EPHEDRINE SULFATE 50 MG/ML IJ SOLN
INTRAMUSCULAR | Status: DC | PRN
  Administered 2015-11-23 (×2): 5 mg via INTRAVENOUS

## 2015-11-23 MED ORDER — ONDANSETRON HCL 4 MG/2ML IJ SOLN: 4.0000 mg | Freq: Once | INTRAMUSCULAR | Status: DC | PRN

## 2015-11-23 MED ORDER — SUGAMMADEX SODIUM 200 MG/2ML IV SOLN
INTRAVENOUS | Status: AC
  Filled 2015-11-23: qty 2

## 2015-11-23 MED ORDER — PHENYLEPHRINE HCL 10 MG/ML IJ SOLN
10.0000 mg | INTRAMUSCULAR | Status: DC | PRN
  Administered 2015-11-23: 20 ug/min via INTRAVENOUS

## 2015-11-23 MED ORDER — FENTANYL CITRATE (PF) 100 MCG/2ML IJ SOLN: 25.0000 ug | INTRAMUSCULAR | Status: DC | PRN

## 2015-11-23 MED ORDER — FENTANYL CITRATE (PF) 100 MCG/2ML IJ SOLN
INTRAMUSCULAR | Status: DC | PRN
  Administered 2015-11-23 (×2): 50 ug via INTRAVENOUS
  Administered 2015-11-23: 100 ug via INTRAVENOUS
  Administered 2015-11-23: 50 ug via INTRAVENOUS

## 2015-11-23 MED ORDER — LIDOCAINE HCL (CARDIAC) 20 MG/ML IV SOLN
INTRAVENOUS | Status: DC | PRN
  Administered 2015-11-23: 100 mg via INTRAVENOUS

## 2015-11-23 MED ORDER — ROCURONIUM BROMIDE 50 MG/5ML IV SOLN
INTRAVENOUS | Status: AC
  Filled 2015-11-23: qty 1

## 2015-11-23 MED ORDER — ONDANSETRON HCL 4 MG/2ML IJ SOLN
INTRAMUSCULAR | Status: DC | PRN
  Administered 2015-11-23: 4 mg via INTRAVENOUS

## 2015-11-23 MED ORDER — LIDOCAINE-EPINEPHRINE 0.5 %-1:200000 IJ SOLN
INTRAMUSCULAR | Status: AC
  Filled 2015-11-23: qty 1

## 2015-11-23 MED ORDER — ARTIFICIAL TEARS OP OINT
TOPICAL_OINTMENT | OPHTHALMIC | Status: DC | PRN
  Administered 2015-11-23: 1 via OPHTHALMIC

## 2015-11-23 MED ORDER — ONDANSETRON HCL 4 MG/2ML IJ SOLN
INTRAMUSCULAR | Status: AC
  Filled 2015-11-23: qty 2

## 2015-11-23 MED ORDER — ROCURONIUM BROMIDE 100 MG/10ML IV SOLN
INTRAVENOUS | Status: DC | PRN
  Administered 2015-11-23: 30 mg via INTRAVENOUS

## 2015-11-23 MED ORDER — SODIUM CHLORIDE 0.9 % IV SOLN
INTRAVENOUS | Status: DC | PRN
  Administered 2015-11-23: 07:00:00 via INTRAVENOUS

## 2015-11-23 MED ORDER — SODIUM CHLORIDE 0.9 % IV SOLN
125.0000 mg | Freq: Once | INTRAVENOUS | Status: AC
  Administered 2015-11-23: 125 mg via INTRAVENOUS
  Filled 2015-11-23 (×2): qty 10

## 2015-11-23 MED ORDER — 0.9 % SODIUM CHLORIDE (POUR BTL) OPTIME
TOPICAL | Status: DC | PRN
  Administered 2015-11-23: 1000 mL

## 2015-11-23 MED ORDER — DEXAMETHASONE SODIUM PHOSPHATE 10 MG/ML IJ SOLN
INTRAMUSCULAR | Status: AC
  Filled 2015-11-23: qty 1

## 2015-11-23 MED ORDER — ARTIFICIAL TEARS OP OINT
TOPICAL_OINTMENT | OPHTHALMIC | Status: AC
  Filled 2015-11-23: qty 3.5

## 2015-11-23 MED ORDER — FENTANYL CITRATE (PF) 250 MCG/5ML IJ SOLN
INTRAMUSCULAR | Status: AC
  Filled 2015-11-23: qty 5

## 2015-11-23 MED ORDER — DEXAMETHASONE SODIUM PHOSPHATE 10 MG/ML IJ SOLN
INTRAMUSCULAR | Status: DC | PRN
  Administered 2015-11-23: 8 mg via INTRAVENOUS

## 2015-11-23 MED ORDER — HEPARIN SODIUM (PORCINE) 1000 UNIT/ML IJ SOLN
INTRAMUSCULAR | Status: AC
  Filled 2015-11-23: qty 1

## 2015-11-23 MED ORDER — LIDOCAINE 2% (20 MG/ML) 5 ML SYRINGE
INTRAMUSCULAR | Status: AC
  Filled 2015-11-23: qty 5

## 2015-11-23 MED ORDER — SUGAMMADEX SODIUM 200 MG/2ML IV SOLN
INTRAVENOUS | Status: DC | PRN
  Administered 2015-11-23: 200 mg via INTRAVENOUS

## 2015-11-23 SURGICAL SUPPLY — 39 items
ARMBAND PINK RESTRICT EXTREMIT (MISCELLANEOUS) ×4 IMPLANT
BENZOIN TINCTURE PRP APPL 2/3 (GAUZE/BANDAGES/DRESSINGS) ×4 IMPLANT
BIOPATCH RED 1 DISK 7.0 (GAUZE/BANDAGES/DRESSINGS) ×3 IMPLANT
BIOPATCH RED 1IN DISK 7.0MM (GAUZE/BANDAGES/DRESSINGS) ×1
BNDG GAUZE ELAST 4 BULKY (GAUZE/BANDAGES/DRESSINGS) ×4 IMPLANT
CANISTER SUCTION 2500CC (MISCELLANEOUS) ×4 IMPLANT
CANNULA VESSEL 3MM 2 BLNT TIP (CANNULA) ×4 IMPLANT
CATH PALINDROME REV 28CM (CATHETERS) ×4 IMPLANT
CLIP LIGATING EXTRA MED SLVR (CLIP) ×4 IMPLANT
CLIP LIGATING EXTRA SM BLUE (MISCELLANEOUS) ×4 IMPLANT
CLOSURE STERI-STRIP 1/2X4 (GAUZE/BANDAGES/DRESSINGS) ×1
CLOSURE WOUND 1/2 X4 (GAUZE/BANDAGES/DRESSINGS) ×1
CLSR STERI-STRIP ANTIMIC 1/2X4 (GAUZE/BANDAGES/DRESSINGS) ×3 IMPLANT
COVER PROBE W GEL 5X96 (DRAPES) ×4 IMPLANT
DECANTER SPIKE VIAL GLASS SM (MISCELLANEOUS) ×4 IMPLANT
DRSG COVADERM 4X6 (GAUZE/BANDAGES/DRESSINGS) ×4 IMPLANT
ELECT REM PT RETURN 9FT ADLT (ELECTROSURGICAL) ×4
ELECTRODE REM PT RTRN 9FT ADLT (ELECTROSURGICAL) ×2 IMPLANT
GAUZE SPONGE 4X4 12PLY STRL (GAUZE/BANDAGES/DRESSINGS) ×4 IMPLANT
GEL ULTRASOUND 20GR AQUASONIC (MISCELLANEOUS) IMPLANT
GLOVE SS BIOGEL STRL SZ 7.5 (GLOVE) ×2 IMPLANT
GLOVE SUPERSENSE BIOGEL SZ 7.5 (GLOVE) ×2
GOWN STRL REUS W/ TWL LRG LVL3 (GOWN DISPOSABLE) ×6 IMPLANT
GOWN STRL REUS W/TWL LRG LVL3 (GOWN DISPOSABLE) ×6
KIT BASIN OR (CUSTOM PROCEDURE TRAY) ×4 IMPLANT
KIT ROOM TURNOVER OR (KITS) ×4 IMPLANT
NS IRRIG 1000ML POUR BTL (IV SOLUTION) ×4 IMPLANT
PACK CV ACCESS (CUSTOM PROCEDURE TRAY) ×4 IMPLANT
PAD ARMBOARD 7.5X6 YLW CONV (MISCELLANEOUS) ×8 IMPLANT
SPONGE GAUZE 4X4 12PLY STER LF (GAUZE/BANDAGES/DRESSINGS) ×8 IMPLANT
STRIP CLOSURE SKIN 1/2X4 (GAUZE/BANDAGES/DRESSINGS) ×3 IMPLANT
SUT PROLENE 6 0 CC (SUTURE) ×4 IMPLANT
SUT SILK 2 0 FS (SUTURE) ×4 IMPLANT
SUT VIC AB 3-0 SH 27 (SUTURE) ×8
SUT VIC AB 3-0 SH 27X BRD (SUTURE) ×8 IMPLANT
SYR 30ML LL (SYRINGE) ×4 IMPLANT
TAPE CLOTH SURG 4X10 WHT LF (GAUZE/BANDAGES/DRESSINGS) ×4 IMPLANT
UNDERPAD 30X30 INCONTINENT (UNDERPADS AND DIAPERS) ×4 IMPLANT
WATER STERILE IRR 1000ML POUR (IV SOLUTION) ×4 IMPLANT

## 2015-11-23 NOTE — Anesthesia Postprocedure Evaluation (Signed)
Anesthesia Post Note  Patient: Bruce Cole  Procedure(s) Performed: Procedure(s) (LRB): ARTERIOVENOUS (AV) FISTULA CREATION (Left) INSERTION OF DIALYSIS CATHETER (Left)  Patient location during evaluation: PACU Anesthesia Type: General Level of consciousness: awake and alert Pain management: pain level controlled Vital Signs Assessment: post-procedure vital signs reviewed and stable Respiratory status: spontaneous breathing, nonlabored ventilation, respiratory function stable and patient connected to nasal cannula oxygen Cardiovascular status: blood pressure returned to baseline and stable Postop Assessment: no signs of nausea or vomiting Anesthetic complications: no    Last Vitals:  Filed Vitals:   11/23/15 1445 11/23/15 1500  BP: 98/60 101/61  Pulse: 51 53  Temp:    Resp: 29 25    Last Pain:  Filed Vitals:   11/23/15 1504  PainSc: 0-No pain                 Catalina Gravel

## 2015-11-23 NOTE — Progress Notes (Signed)
ANTICOAGULATION CONSULT NOTE - Follow Up Consult  Pharmacy Consult for Coumadin Indication: atrial fibrillation  Allergies  Allergen Reactions  . Ace Inhibitors Other (See Comments)    Worsening renal insufficiency    Patient Measurements: Height: 6\' 3"  (190.5 cm) Weight: 236 lb 15.9 oz (107.5 kg) IBW/kg (Calculated) : 84.5  Vital Signs: Temp: 98.2 F (36.8 C) (05/08 1428) Temp Source: Oral (05/08 1428) BP: 101/61 mmHg (05/08 1500) Pulse Rate: 53 (05/08 1500)  Labs:  Recent Labs  11/21/15 0301  11/21/15 1409 11/22/15 0725 11/23/15 0532  HGB  --   < > 7.0* 9.1* 9.0*  HCT  --   --  23.4* 28.8* 28.8*  PLT  --   --  102*  --  124*  LABPROT 17.4*  --   --  17.1* 15.3*  INR 1.42  --   --  1.38 1.19  CREATININE  --   --  6.37*  --  6.30*  < > = values in this interval not displayed.  Estimated Creatinine Clearance: 14 mL/min (by C-G formula based on Cr of 6.3).  Assessment: 74yom on coumadin pta for afib. INR on admit 4/25 was 5.58 and coumadin placed on hold for possible GIB. Underwent endoscopy 4/28 which showed mild gastritis but otherwise normal. Coumadin remained on hold for left arm fistulogram 5/4 and left IJ HD cath/AV fistula today. INR down to 1.19 (no reversal agents given). Coumadin to resume tonight.  Home dose: 7.5mg  daily  Goal of Therapy:  INR 2-3 Monitor platelets by anticoagulation protocol: Yes   Plan:  1) Coumadin 10mg  x 1 2) Daily INR  Deboraha Sprang 11/23/2015,3:04 PM

## 2015-11-23 NOTE — Procedures (Signed)
I have personally attended this patient's dialysis session.   [place] placed today running at 400 UF goal 3.5 liters Tolerating so far  Jamal Maes, MD [place] Pager 11/23/2015, 6:02 PM

## 2015-11-23 NOTE — Progress Notes (Signed)
Patient ID: Bruce Cole, male   DOB: [birth year], 75 y.o.   MRN: [name]  PROGRESS NOTE    TRASEAN HOLTON  [name] DOB: [birth year] DOA: 11/10/2015  PCP: Nance Pear., NP  Outpatient Specialists: Dr. [name] of nephrology   Brief Narrative:  Per brief narrative 5/7 "75 y.o. male with rheumatoid arthritis, chronic atrial fibrillation on warfarin, chronic systolic CHF, and chronic kidney disease stage IV who presented to the ED at [facility] for evaluation of dyspnea and malaise 4 days duration. Patient has been followed closely by outpatient cardiologist and nephrologist and had AV fistula created in the left upper extremity on 08/26/2015, but has not yet required dialysis.  ED Course: Upon arrival to the [facility] ED, Chest x-ray demonstrated a right sided pleural effusion with an associated opacity in the right base. Transferred to [facility] for admission."  Major events: 4/27 - more dyspneic in the afternoon 4 pm, ordered CXR as pt unable to tolerate CT, PCCM consulted, ABG pend, also call nephrology team again to consider HD sooner than expected 5/4 - pt reports feeling better but still has questions, offered to answer questions, he said he wants to rest and he needs to speak with nephrologist  5/5 - patient still wanting to know what the course of his care will be but is very reluctant to speak to physicians 5/6 - IR thoracentesis tomorrow 5/7 - successful thoracentesis 1.4 L out, reconsulting GI 5/8 - AV fistula placement    Assessment & Plan:  Acute blood loss anemia / Acute upper GI bleed / Anemia of chronic renal failure  - EGD done 4/28, notable for mild gastritis and per GI may cause heme positive stool - Pt is s/p pRBC transfusion 2 Units on admission, 2 Units pRBC 5/6 - Stable hemoglobin so far, 9.0 - Continue protonix 40 mg BID - No further workup per GI - Continue ferric gluconate and aranesp with HD  Chronic atrial fibrillation with  supratherapeutic INR, bradycardia  - CHADS-VASc 3 (age, HTN, CH) - Still has slight brady of 58 but he is on low dose coreg, 3.125 mg daily - Resume coumadin dosing per pharmacy   AKI on CKD IV --> ESRD now on HD - S/p fistulogram 5/4, to [place] with vascular surgery on 5/8 - AV fistula placed 5/8 - Per renal schedule   Chronic systolic CHF/Volume overload  - TTE (08/18/15) with EF 40-45%  - On HD  Hypertension, essential - Continue coreg 3.125 mg daily    Rheumatoid arthritis  - Stable  - Continue home dose Plaquenil   Right pleural effusion  - CT chest confirms right pleural effusion with extensive passive atelectasis, loculated component of the pleural effusion.  - Thoracentesis on 5/71.4 L out.  - Postprocedure chest x-ray is clear no acute processes.  Prolonged QTc, resolved - QTc 555 ms on admission, repeat EKG done on 5/5 showed resolution  Obesity  - Body mass index is 34.15 kg/(m^2).  Thrombocytopenia - No reports of bleeding - Continue to monitor CBC   DVT prophylaxis: stop subQ heparin and use SCD's biilaterally; resume coumadin dosing per pharmacy  Code Status: full code  Family Communication: family at the bedside  Disposition Plan: once cleared by nephrology    Consultants:   Nephrology  Cardiology   GI  PCCM  Vascular  Procedures:   HD  AV fistula 11/23/2015  EGD 4/28 - mild gastritis, continue with PPI  HD cath 4/28   Fistulogram 5/4  R U/S guided thoracentesis  Antimicrobials:   None    Subjective: No overnight events. Feels okay this am.  Objective: Filed Vitals:   11/23/15 1145 11/23/15 1210 11/23/15 1219 11/23/15 1344  BP: 114/68 113/66 113/66   Pulse: 58  58   Temp: 97.3 F (36.3 C)   98.7 F (37.1 C)  TempSrc:    Oral  Resp: 19  18   Height:      Weight:      SpO2: 100%  100%     Intake/Output Summary (Last 24 hours) at 11/23/15 1441 Last data filed at 11/23/15 1344  Gross per 24 hour  Intake     930 ml  Output    190 ml  Net    740 ml   Filed Weights   11/21/15 1700 11/22/15 0313 11/23/15 0500  Weight: 105.8 kg (233 lb 4 oz) 106.55 kg (234 lb 14.4 oz) 105.779 kg (233 lb 3.2 oz)    Examination:  General exam: Appears calm and comfortable  Respiratory system: Clear to auscultation. Respiratory effort normal. Cardiovascular system: S1 & S2 heard, RRR. No JVD Gastrointestinal system: Abdomen is nondistended, soft and nontender. No organomegaly or masses felt. Normal bowel sounds heard. Central nervous system: Alert and oriented. No focal neurological deficits. Extremities: Symmetric 5 x 5 power. Left arm fistula  Skin: No rashes, lesions or ulcers Psychiatry: Judgement and insight appear normal. Mood & affect appropriate.   Data Reviewed: I have personally reviewed following labs and imaging studies  CBC:  Recent Labs Lab 11/19/15 1129 11/19/15 2130 11/20/15 0823 11/21/15 1409 11/22/15 0725 11/23/15 0532  WBC 7.4 7.3 7.0 7.6  --  6.9  HGB 7.3* 7.3* 7.3* 7.0* 9.1* 9.0*  HCT 24.4* 23.9* 23.6* 23.4* 28.8* 28.8*  MCV 89.4 89.2 89.1 86.7  --  86.5  PLT 89* 90* 100* 102*  --  [id number]*   Basic Metabolic Panel:  Recent Labs Lab 11/18/15 0410 11/18/15 2201 11/19/15 0248 11/19/15 1129 11/19/15 2130 11/20/15 0823 11/21/15 1409 11/23/15 0532  NA 136  137 137 137  --  137 136 135 131*  K 3.7  3.7 4.0 4.1  --  4.1 4.3 3.9 4.3  CL 98*  98* 99* 99*  --  98* 95* 97* 94*  CO2 26  26 27 27   --  27 27 26 25   GLUCOSE 104*  105* 114* 102*  --  111* 90 94 93  BUN 38*  38* 28* 31*  --  40* 45* 39* 43*  CREATININE 5.48*  5.54* 4.91* 5.17* 5.69* 6.26* 6.47* 6.37* 6.30*  CALCIUM 9.1  9.1 8.9 9.0  --  9.1 9.3 9.2 9.3  PHOS 3.6 3.3  --   --  4.1 4.7* 3.5  --    GFR: Estimated Creatinine Clearance: 13.9 mL/min (by C-G formula based on Cr of 6.3). Liver Function Tests:  Recent Labs Lab 11/18/15 0410 11/18/15 2201 11/19/15 2130 11/20/15 0823 11/21/15 1409  ALBUMIN 3.5  3.4* 3.2* 3.4* 3.1*   No results for input(s): LIPASE, AMYLASE in the last 168 hours. No results for input(s): AMMONIA in the last 168 hours. Coagulation Profile:  Recent Labs Lab 11/19/15 0248 11/20/15 0823 11/21/15 0301 11/22/15 0725 11/23/15 0532  INR 1.64* 1.44 1.42 1.38 1.19   Cardiac Enzymes: No results for input(s): CKTOTAL, CKMB, CKMBINDEX, TROPONINI in the last 168 hours. BNP (last 3 results) No results for input(s): PROBNP in the last 8760 hours. HbA1C: No results for input(s): HGBA1C in the  last 72 hours. CBG: No results for input(s): GLUCAP in the last 168 hours. Lipid Profile: No results for input(s): CHOL, HDL, LDLCALC, TRIG, CHOLHDL, LDLDIRECT in the last 72 hours. Thyroid Function Tests: No results for input(s): TSH, T4TOTAL, FREET4, T3FREE, THYROIDAB in the last 72 hours. Anemia Panel: No results for input(s): VITAMINB12, FOLATE, FERRITIN, TIBC, IRON, RETICCTPCT in the last 72 hours. Urine analysis:    Component Value Date/Time   COLORURINE YELLOW 11/10/2015 1800   APPEARANCEUR CLEAR 11/10/2015 1800   LABSPEC 1.012 11/10/2015 1800   PHURINE 5.5 11/10/2015 1800   GLUCOSEU NEGATIVE 11/10/2015 1800   GLUCOSEU NEGATIVE 05/05/2015 1135   HGBUR NEGATIVE 11/10/2015 1800   HGBUR negative 09/21/2009 1008   BILIRUBINUR NEGATIVE 11/10/2015 1800   KETONESUR NEGATIVE 11/10/2015 1800   PROTEINUR NEGATIVE 11/10/2015 1800   UROBILINOGEN 0.2 05/05/2015 1135   NITRITE NEGATIVE 11/10/2015 1800   LEUKOCYTESUR NEGATIVE 11/10/2015 1800   Sepsis Labs: @LABRCNTIP (procalcitonin:4,lacticidven:4)  Recent Results (from the past 240 hour(s))  Gram stain     Status: None   Collection Time: 11/22/15  9:16 AM  Result Value Ref Range Status   Specimen Description FLUID PLEURAL RIGHT  Final   Special Requests NONE  Final   Gram Stain   Final    FEW WBC PRESENT,BOTH PMN AND MONONUCLEAR NO ORGANISMS SEEN    Report Status 11/22/2015 FINAL  Final      Radiology Studies: Dg  Chest 1 View 11/22/2015  Stable cardiomegaly. No appreciable edema or consolidation. No pneumothorax. No pleural effusion currently evident. Electronically Signed   By: Lowella Grip III M.D.   On: 11/22/2015 09:45   Dg Chest 2 View 11/20/2015   1. Stable mild cardiomegaly without pulmonary edema. 2. Stable moderate right pleural effusion. 3. Stable patchy right lower lung opacity, favor atelectasis. Electronically Signed   By: Ilona Sorrel M.D.   On: 11/20/2015 14:14   Dg Chest Port 1 View 11/23/2015   Interval placement of left internal jugular dialysis catheter. No pneumothorax is noted. Mild right basilar subsegmental atelectasis or effusion is noted. Electronically Signed   By: Marijo Conception, M.D.   On: 11/23/2015 11:58   US Thoracentesis Asp Pleural Space W/img Guide 11/22/2015  Successful ultrasound guided diagnostic and therapeutic right thoracentesis yielding 1.4 liters of pleural fluid. Read by: Rowe Robert, PA-C Electronically Signed   By: Aletta Edouard M.D.   On: 11/22/2015 10:13      Scheduled Meds: . allopurinol  200 mg Oral Daily  . carvedilol  3.125 mg Oral Daily  . darbepoetin (ARANESP) injection - NON-DIALYSIS  100 mcg Subcutaneous Q Wed-1800  . ferric gluconate (FERRLECIT/NULECIT) IV  125 mg Intravenous Once  . [START ON 11/24/2015] ferric gluconate (FERRLECIT/NULECIT) IV  125 mg Intravenous Q T,Th,Sa-HD  . fluticasone  2 spray Each Nare Daily  . heparin  5,000 Units Subcutaneous Q8H  . hydroxychloroquine  200 mg Oral BID  . multivitamin  1 tablet Oral QHS  . pantoprazole  40 mg Oral BID  . sevelamer carbonate  800 mg Oral TID WC   Continuous Infusions:    LOS: 13 days    Time spent: 25 minutes   Leisa Lenz, MD [facility] Pager [phone]  If 7PM-7AM, please contact night-coverage [email or web address] Password [email or web address] 11/23/2015, 2:41 PM

## 2015-11-23 NOTE — Consult Note (Signed)
Physical Medicine and Rehabilitation Consult   Reason for Consult: Debility Referring Physician: Dr. [name]   HPI: Bruce Cole is a 75 y.o. male with history of CAF- on coumadin, CHF, OSA, RA,  CKD stage 4 who was admitted on 11/10/15 with SOB, lethargy and fluid overload.  INR supra-therapeutic at 5.98 and patient had heme positive stools with Hgb 6.5 as well as recurrent right pleural effusion.  He was treated with 2 unit PRBC as well as diuresis.  Coumadin held and he underwent EGD on 04/28 showing mild gastritis.  Large right effusion noted and Dr. Titus Mould recommended thoracocentesis once INR levels drifted down.  HD initiated for management of fluid overload as well as SOB due to pulmonary edema. VVS consulte due to poorly functioning fistula and left arm fistulogram with recanalization on 05/04 due to sclerotic fistula with high risk of occlusion.  He underwent right thoracocentesis of 1.4 L yellow fluid by IVR.  HD ongoing via temp cath and new  L-AVF placed on 05/08 by Dr. Trula Slade.   Patient has not been out of bed yet. He reports that he has been limited due to uremic symptoms since Jan as well as pain in right hip. He was able to walk household distances with rest breaks.  He picks up meals and has assistance once a month for housework. No local family or friends indicated.  CIR recommended in anticipation of rehab needs.    Review of Systems  Constitutional: Positive for malaise/fatigue.  HENT: Negative for hearing loss.   Eyes: Negative for blurred vision and double vision.  Respiratory: Positive for shortness of breath. Negative for cough.   Cardiovascular: Positive for leg swelling. Negative for chest pain and palpitations.  Gastrointestinal: Positive for nausea, abdominal pain and constipation.       Poor appetite  Genitourinary: Positive for dysuria. Negative for urgency.  Musculoskeletal: Positive for joint pain (right hip pain--needs replacement. ).    Neurological: Positive for dizziness, weakness and headaches.  Psychiatric/Behavioral: The patient has insomnia.   All other systems reviewed and are negative.    Past Medical History  Diagnosis Date  . Nephrolithiasis   . Arthritis     rheumatoid  . Colon polyp 2000  . Atrial fibrillation ([place])   . Unspecified essential hypertension   . Gout   . CHF (congestive heart failure) [facility])     Past Surgical History  Procedure Laterality Date  . Spine surgery      x 2  . Joint replacement      Total L-Hip replacement, Right Knee 10/20/09  . Cholecystectomy  1994  . Left and right heart catheterization with coronary angiogram N/A 02/22/2013    Procedure: LEFT AND RIGHT HEART CATHETERIZATION WITH CORONARY ANGIOGRAM;  Surgeon: Jolaine Artist, MD;  Location: [facility] CATH LAB;  Service: Cardiovascular;  Laterality: N/A;  . Av fistula placement Left 08/26/2015    Procedure: LEFT RADIOCEPHALIC FISTULA CREATION;  Surgeon: Rosetta Posner, MD;  Location: [facility] OR;  Service: Vascular;  Laterality: Left;  . Esophagogastroduodenoscopy N/A 11/13/2015    Procedure: ESOPHAGOGASTRODUODENOSCOPY (EGD);  Surgeon: Irene Shipper, MD;  Location: [facility] ENDOSCOPY;  Service: Endoscopy;  Laterality: N/A;  . Peripheral vascular catheterization Left 11/19/2015    Procedure: A/V/Fistulagram;  Surgeon: Conrad Alhambra, MD;  Location: [place] CV LAB;  Service: Cardiovascular;  Laterality: Left;    Family History  Problem Relation Age of Onset  . Hypertension Mother   . Arthritis Mother     ?  RA  . Hypertension Father      Social History:  Lives alone. Independent with cane PTA. He reports that he has never smoked. He has never used smokeless tobacco. He reports that he drinks alcohol. He reports that he does not use illicit drugs.    Allergies  Allergen Reactions  . Ace Inhibitors Other (See Comments)    Worsening renal insufficiency    Medications Prior to Admission  Medication Sig Dispense Refill  . albuterol  (PROVENTIL) (2.5 MG/3ML) 0.083% nebulizer solution Take 2.5 mg by nebulization every 4 (four) hours as needed for shortness of breath. Reported on 09/25/2015    . carvedilol (COREG) 3.125 MG tablet Take 3.125 mg by mouth daily.    . colchicine 0.6 MG tablet Take 0.6 mg by mouth daily.    . fluticasone (FLONASE) 50 MCG/ACT nasal spray Place 2 sprays into both nostrils daily. 16 g 6  . furosemide (LASIX) 80 MG tablet TAKE 1 TABLET(80 MG) BY MOUTH THREE TIMES DAILY 90 tablet 5  . hydrALAZINE (APRESOLINE) 50 MG tablet Take 50 mg by mouth 2 (two) times daily.    . hydroxychloroquine (PLAQUENIL) 200 MG tablet Take 200 mg by mouth 2 (two) times daily.    . isosorbide mononitrate (IMDUR) 30 MG 24 hr tablet TAKE 1 TABLET BY MOUTH EVERY DAY 90 tablet 0  . metolazone (ZAROXOLYN) 5 MG tablet Take 1 tablet (5 mg total) by mouth 2 (two) times a week. AS NEEDED FOR WT GREATER THAN 265 20 tablet 3  . Multiple Vitamin (MULTIVITAMIN WITH MINERALS) TABS tablet Take 1 tablet by mouth daily.    . potassium chloride SA (K-DUR,KLOR-CON) 20 MEQ tablet Take 20 mEq by mouth 3 (three) times daily.    [name] terazosin (HYTRIN) 1 MG capsule TAKE 1 CAPSULE BY MOUTH EVERY NIGHT AT BEDTIME 90 capsule 1  . colchicine 0.6 MG tablet TAKE 2 TABLETS BY MOUTH AT THE START OF GOUT FLARE AND TAKE 1 TABLET 3 HOURS LATER; MAX 3 TABS PER GOUT ATTACK (Patient not taking: Reported on 11/10/2015) 30 tablet 0  . warfarin (COUMADIN) 7.5 MG tablet TAKE 1 TABLET BY MOUTH DAILY AS DIRECTED BY COUMADIN CLINIC (Patient not taking: Reported on 11/10/2015) 120 tablet 0    Home: Home Living Family/patient expects to be discharged to:: Private residence Living Arrangements: Alone  Functional History:   Functional Status:  Mobility:          ADL:    Cognition: Cognition Orientation Level: Oriented X4    Blood pressure 104/53, pulse 52, temperature 97.4 F (36.3 C), temperature source Oral, resp. rate 22, height 6\' 3"  (1.905 m), weight 105.4 kg  (232 lb 5.8 oz), SpO2 98 %. Physical Exam  Nursing note and vitals reviewed. Constitutional: He is oriented to person, place, and time. He appears well-developed and well-nourished. No distress.  Seen in HD unit.  HENT:  Head: Normocephalic and atraumatic.  Mouth/Throat: Oropharynx is clear and moist.  Eyes: Conjunctivae and EOM are normal. Pupils are equal, round, and reactive to light. Right eye exhibits no discharge. Left eye exhibits no discharge.  Neck: Normal range of motion. Neck supple.  Cardiovascular: Normal rate and regular rhythm.   No murmur heard. Respiratory: Effort normal and breath sounds normal. No respiratory distress. He has no wheezes.  GI: Soft. He exhibits distension. Bowel sounds are decreased. There is no tenderness.  Musculoskeletal: He exhibits edema (1+ at ankle/foot bilaterally. ) and tenderness (right hip).  HD ongoing--with limited exam. Able to  move RUE and BLE--pain right hip with attempts at ROM.  Neurological: He is alert and oriented to person, place, and time.  Motor: 4-4+/5 throughout (limited secondary to OA and RA)  Skin: Skin is warm and dry. He is not diaphoretic.  Psychiatric: He has a normal mood and affect. His behavior is normal. Thought content normal.    Results for orders placed or performed during the hospital encounter of 11/10/15 (from the past 24 hour(s))  Protime-INR     Status: Abnormal   Collection Time: 11/24/15  5:33 AM  Result Value Ref Range   Prothrombin Time 16.0 (H) 11.6 - 15.2 seconds   INR 1.27 0.00 - 1.49  CBC     Status: Abnormal (Preliminary result)   Collection Time: 11/24/15  8:08 AM  Result Value Ref Range   WBC 9.5 4.0 - 10.5 K/uL   RBC 3.25 (L) 4.22 - 5.81 MIL/uL   Hemoglobin 8.7 (L) 13.0 - 17.0 g/dL   HCT 28.2 (L) 39.0 - 52.0 %   MCV 86.8 78.0 - 100.0 fL   MCH 26.8 26.0 - 34.0 pg   MCHC 30.9 30.0 - 36.0 g/dL   RDW 17.3 (H) 11.5 - 15.5 %   Platelets PENDING 150 - 400 K/uL  Renal function panel     Status:  Abnormal   Collection Time: 11/24/15  8:08 AM  Result Value Ref Range   Sodium 133 (L) 135 - 145 mmol/L   Potassium 3.6 3.5 - 5.1 mmol/L   Chloride 95 (L) 101 - 111 mmol/L   CO2 26 22 - 32 mmol/L   Glucose, Bld 118 (H) 65 - 99 mg/dL   BUN 31 (H) 6 - 20 mg/dL   Creatinine, Ser 5.63 (H) 0.61 - 1.24 mg/dL   Calcium 9.2 8.9 - 10.3 mg/dL   Phosphorus 4.2 2.5 - 4.6 mg/dL   Albumin 3.4 (L) 3.5 - 5.0 g/dL   GFR calc non Af Amer 9 (L) >60 mL/min   GFR calc Af Amer 10 (L) >60 mL/min   Anion gap 12 5 - 15   Dg Chest Port 1 View  11/23/2015  CLINICAL DATA:  Status post dialysis catheter insertion. EXAM: PORTABLE CHEST 1 VIEW COMPARISON:  Nov 22, 2015. FINDINGS: Stable cardiomegaly. Interval placement of internal jugular dialysis catheter with distal tip in the expected position of the right atrium. No pneumothorax is noted. Left lung is clear. Mild right basilar opacity is noted most consistent with atelectasis or pleural effusion. Bony thorax is unremarkable. IMPRESSION: Interval placement of left internal jugular dialysis catheter. No pneumothorax is noted. Mild right basilar subsegmental atelectasis or effusion is noted. Electronically Signed   By: Marijo Conception, M.D.   On: 11/23/2015 11:58    Assessment/Plan: Diagnosis: Debility Labs and images independently reviewed.  Records reviewed and summated above.  1. Does the need for close, 24 hr/day medical supervision in concert with the patient's rehab needs make it unreasonable for this patient to be served in a less intensive setting? Potentially  Co-Morbidities requiring supervision/potential complications: AFib (cont meds), CHF (Monitor in accordance with increased physical activity and avoid UE resistance excercises), OSA (cont CPAP, monitor for daytime somnolence and energy), RA (monitor for flares), CKD (cont recs per Nephro), supra-therapeutic (monitor), heme positive stools (cont to monitor), recurrent right pleural effusion (cont to monitor),  tachypnea (monitor RR and O2 Sats with increased physical exertion), ABLA (transfuse if necessary to ensure appropriate perfusion for increased activity tolerance), right hip OA (ensure pain  does not limit therapies), Thrombocytopenia (< 60,000/mm3 no resistive exercise) 2. Due to safety, skin/wound care, disease management and patient education, does the patient require 24 hr/day rehab nursing? Yes 3. Does the patient require coordinated care of a physician, rehab nurse, PT (1-2 hrs/day, 5 days/week) and OT (1-2 hrs/day, 5 days/week) to address physical and functional deficits in the context of the above medical diagnosis(es)? Potentially Addressing deficits in the following areas: TBD 4. Can the patient actively participate in an intensive therapy program of at least 3 hrs of therapy per day at least 5 days per week? TBD 5. The potential for patient to make measurable gains while on inpatient rehab is TBD 6. Anticipated functional outcomes upon discharge from inpatient rehab are TBD  with PT, TBD with OT, n/a with SLP. 7. Estimated rehab length of stay to reach the above functional goals is: TBD 8. Does the patient have adequate social supports and living environment to accommodate these discharge functional goals? Potentially 9. Anticipated D/C setting: TBD 10. Anticipated post D/C treatments: TBD 11. Overall Rehab/Functional Prognosis: good  RECOMMENDATIONS: [name] condition is appropriate for continued rehabilitative care in the following setting: Will await therapy consults prior to determining most appropriate discharge disposition. Patient has agreed to participate in recommended program. Potentially Note that insurance prior authorization may be required for reimbursement for recommended care.  Comment: Rehab Admissions Coordinator to follow up.  Delice Lesch, MD 11/24/2015

## 2015-11-23 NOTE — Anesthesia Procedure Notes (Signed)
Procedure Name: Intubation Date/Time: 11/23/2015 7:49 AM Performed by: Jacquiline Doe A Pre-anesthesia Checklist: Patient identified, Emergency Drugs available, Suction available, Patient being monitored and Timeout performed Patient Re-evaluated:Patient Re-evaluated prior to inductionOxygen Delivery Method: Circle system utilized Preoxygenation: Pre-oxygenation with 100% oxygen Intubation Type: IV induction and Cricoid Pressure applied Ventilation: Mask ventilation without difficulty Laryngoscope Size: Mac and 4 Grade View: Grade I Tube type: Oral Tube size: 7.5 mm Number of attempts: 1 Airway Equipment and Method: Stylet Placement Confirmation: ETT inserted through vocal cords under direct vision,  positive ETCO2 and breath sounds checked- equal and bilateral Secured at: 23 cm Tube secured with: Tape Dental Injury: Teeth and Oropharynx as per pre-operative assessment

## 2015-11-23 NOTE — Progress Notes (Signed)
Thank you for consult on Bruce Cole. He is out of the room for procedure and PT/OT evaluations pending. Will await evaluations to help determine appropriate rehab venue.

## 2015-11-23 NOTE — H&P (View-Only) (Signed)
Patient scheduled for left arm fistula tomorrow by Dr. Donnetta Hutching.  He has had vein mapping prior to his original fistula placement, as well as a fistula duplex and fistulogram.  He should be a candidate for either a B-C fistula or BVT.  In addition, his temporary catheter will be converted to a new tunneled catheter vs a new catheter.  NPO after midnight   Bruce Cole

## 2015-11-23 NOTE — Progress Notes (Signed)
OT cancellation    11/23/15 0844  OT Visit Information  Last OT Received On 11/23/15  Reason Eval/Treat Not Completed Patient at procedure or test/ unavailable    Roseanne Reno, OTR/L [phone]

## 2015-11-23 NOTE — Interval H&P Note (Signed)
History and Physical Interval Note:  11/23/2015 10:50 AM  Bruce Cole  has presented today for surgery, with the diagnosis of End stage Renal disease  The various methods of treatment have been discussed with the patient and family. After consideration of risks, benefits and other options for treatment, the patient has consented to  Procedure(s): ARTERIOVENOUS (AV) FISTULA CREATION (Left) INSERTION OF DIALYSIS CATHETER (Left) as a surgical intervention .  The patient's history has been reviewed, patient examined, no change in status, stable for surgery.  I have reviewed the patient's chart and labs.  Questions were answered to the patient's satisfaction.     Curt Jews

## 2015-11-23 NOTE — Progress Notes (Signed)
PT/OT evaluations still pending. Patient currently in HD. Will follow up in am to complete rehab consult.

## 2015-11-23 NOTE — Progress Notes (Signed)
PT Cancellation Note  Patient Details Name: Bruce Cole MRN: [name] DOB: June 04, 1941   Cancelled Treatment:    Reason Eval/Treat Not Completed: Patient at procedure or test/unavailable. Will check back as time allows.    [place], [place] 11/23/2015, 12:34 PM

## 2015-11-23 NOTE — Transfer of Care (Signed)
Immediate Anesthesia Transfer of Care Note  Patient: Bruce Cole  Procedure(s) Performed: Procedure(s): ARTERIOVENOUS (AV) FISTULA CREATION (Left) INSERTION OF DIALYSIS CATHETER (Left)  Patient Location: PACU  Anesthesia Type:General  Level of Consciousness: awake, oriented, sedated, patient cooperative and responds to stimulation  Airway & Oxygen Therapy: Patient Spontanous Breathing and Patient connected to nasal cannula oxygen  Post-op Assessment: Report given to RN, Post -op Vital signs reviewed and stable, Patient moving all extremities and Patient moving all extremities X 4  Post vital signs: Reviewed and stable  Last Vitals:  Filed Vitals:   11/23/15 0500 11/23/15 1058  BP: 114/68 95/61  Pulse:  55  Temp: 36.8 C   Resp: 18 10    Last Pain:  Filed Vitals:   11/23/15 1100  PainSc: Asleep      Patients Stated Pain Goal: 0 ([id number])  Complications: No apparent anesthesia complications

## 2015-11-23 NOTE — Progress Notes (Signed)
[facility] Rounding Note Subjective: Interval History:  Had thoracentesis of 1.5 liters 5/7 "after I stopped coughing I felt much better" Stable SOB Just back from AVF and TDC  Objective: Vital signs in last 24 hours: Temp:  [97.3 F (36.3 C)-98.5 F (36.9 C)] 97.3 F (36.3 C) (05/08 1145) Pulse Rate:  [55-59] 58 (05/08 1219) Resp:  [10-21] 18 (05/08 1219) BP: (95-115)/(61-68) 113/66 mmHg (05/08 1219) SpO2:  [98 %-100 %] 100 % (05/08 1219) Weight:  [105.779 kg (233 lb 3.2 oz)] 105.779 kg (233 lb 3.2 oz) (05/08 0500) Weight change: -2.921 kg (-6 lb 7 oz)  Intake/Output from previous day: 05/07 0701 - 05/08 0700 In: 1020 [P.O.:1020] Out: 175 [Urine:175] Intake/Output this shift: Total I/O In: 450 [I.V.:450] Out: 15 [Blood:15]  Physical Examination Awake, alert, no complaints Lungs with decreased BS bases but clear anteriorly S1S2 No S3 Abd soft and not tender 3+ edema both LE's L AVF with soft bruit, hand warm, no steal  Lab Results:  Recent Labs  11/21/15 1409 11/22/15 0725 11/23/15 0532  WBC 7.6  --  6.9  HGB 7.0* 9.1* 9.0*  HCT 23.4* 28.8* 28.8*  PLT 102*  --  124*   BMET:   Recent Labs  11/21/15 1409 11/23/15 0532  NA 135 131*  K 3.9 4.3  CL 97* 94*  CO2 26 25  GLUCOSE 94 93  BUN 39* 43*  CREATININE 6.37* 6.30*  CALCIUM 9.2 9.3   Studies/Results: Dg Chest 1 View  11/22/2015  CLINICAL DATA:  Status post right-sided thoracentesis EXAM: CHEST 1 VIEW COMPARISON:  Nov 20, 2015 FINDINGS: There has been interval resolution of right pleural effusion. No pneumothorax evident. Central catheter tip in superior vena cava. No edema or consolidation. Heart remains enlarged with pulmonary vascularity within normal limits. No adenopathy. IMPRESSION: Stable cardiomegaly. No appreciable edema or consolidation. No pneumothorax. No pleural effusion currently evident. Electronically Signed   By: Lowella Grip III M.D.   On: 11/22/2015 09:45   Dg Chest Port 1  View  11/23/2015  CLINICAL DATA:  Status post dialysis catheter insertion. EXAM: PORTABLE CHEST 1 VIEW COMPARISON:  Nov 22, 2015. FINDINGS: Stable cardiomegaly. Interval placement of internal jugular dialysis catheter with distal tip in the expected position of the right atrium. No pneumothorax is noted. Left lung is clear. Mild right basilar opacity is noted most consistent with atelectasis or pleural effusion. Bony thorax is unremarkable. IMPRESSION: Interval placement of left internal jugular dialysis catheter. No pneumothorax is noted. Mild right basilar subsegmental atelectasis or effusion is noted. Electronically Signed   By: Marijo Conception, M.D.   On: 11/23/2015 11:58   US Thoracentesis Asp Pleural Space W/img Guide  11/22/2015  INDICATION: End-stage renal disease, CHF, dyspnea, recurrent right pleural effusion. Request made for diagnostic and therapeutic right thoracentesis. EXAM: ULTRASOUND GUIDED DIAGNOSTIC AND THERAPEUTIC RIGHT THORACENTESIS MEDICATIONS: None. COMPLICATIONS: None immediate. PROCEDURE: An ultrasound guided thoracentesis was thoroughly discussed with the patient and questions answered. The benefits, risks, alternatives and complications were also discussed. The patient understands and wishes to proceed with the procedure. Written consent was obtained. Ultrasound was performed to localize and mark an adequate pocket of fluid in the right chest. The area was then prepped and draped in the normal sterile fashion. 1% Lidocaine was used for local anesthesia. Under ultrasound guidance a Safe-T-Centesis catheter was introduced. Thoracentesis was performed. The catheter was removed and a dressing applied. FINDINGS: A total of approximately 1.4 liters of yellow fluid was removed. Samples were sent  to the laboratory as requested by the clinical team. IMPRESSION: Successful ultrasound guided diagnostic and therapeutic right thoracentesis yielding 1.4 liters of pleural fluid. Read by: Rowe Robert,  PA-C Electronically Signed   By: Aletta Edouard M.D.   On: 11/22/2015 10:13    Scheduled: . allopurinol  200 mg Oral Daily  . carvedilol  3.125 mg Oral Daily  . darbepoetin (ARANESP) injection - NON-DIALYSIS  100 mcg Subcutaneous Q Wed-1800  . fluticasone  2 spray Each Nare Daily  . heparin  5,000 Units Subcutaneous Q8H  . hydroxychloroquine  200 mg Oral BID  . multivitamin  1 tablet Oral QHS  . pantoprazole  40 mg Oral BID  . sevelamer carbonate  800 mg Oral TID WC   Assessment/Plan:  1. ESRD, new start- 1st HD 4/27. Clipped to TTS [employer] second shift. HD today, then again tomorrow to get on outpt schedule 2. S/p left upper arm AVF and TDC placement (5/8)  3. S/p ABLA- due to gastritis in setting of anticoagulation. Transfused PRBC's . 4. Anemia of ESRD (+ABLA) tsat on 11/12/15 was 4%. Has not received any iron. Load with HD X 1 gram total 5. CKD-MBD - check PTH. On binders. Phos looks fine 6. CHF/Volume overload- cont with UF with HD---improved.Still probably 10+ lb volume to remove. Weight down from 123 kg to nadir of 105 kg so far  7. A fib on coumadin-  Coumadin on hold for GIB 8. Right Pleural effusion s/p Thoracentesis 1.5L on 5/7 - symptomaticcally better 9. Disposition - lives alone. Rehab consult pending.    Bruce Cole B 11/23/2015,1:29 PM

## 2015-11-23 NOTE — Op Note (Signed)
OPERATIVE REPORT  DATE OF SURGERY: 11/23/2015  PATIENT: Bruce Cole, 75 y.o. male MRN: [id number]  DOB: Jan 16, 1941  PRE-OPERATIVE DIAGNOSIS: End-stage renal disease  POST-OPERATIVE DIAGNOSIS:  Same  PROCEDURE: #1 left IJ hemodialysis catheter with SonoSite visualization, #2 left basilic vein transposition fistula  SURGEON:  Curt Jews, M.D.  PHYSICIAN ASSISTANT: Samantha Rhyne PA-C  ANESTHESIA:  Gen.  EBL: Minimal ml  Total I/O In: 450 [I.V.:450] Out: 15 [Blood:15]  BLOOD ADMINISTERED: None  DRAINS: None  SPECIMEN: None  COUNTS CORRECT:  YES  PLAN OF CARE: PACU   PATIENT DISPOSITION:  PACU - hemodynamically stable  PROCEDURE DETAILS: The patient was taken to the operating placed supine position where the area of the left  Drains used sterile fashion. The patient had an indwelling right IJ temporary dialysis catheter. This is been present for some time there was quite a bit of old blood and irritation around this. Decision wasplace a new left IJ catheter versus exchanging the current one with a wire. Site visualization was used to identify the internal jugular vein and using a singlewall puncture of the internal jugular vein was entered with an 18-gauge needle. Guidewire was passed level the right atrium and this was confirmed with fluoroscopy. The dilator was passed over the guidewire and the dilator and peel-away sheath was passed over the guidewire. The dilator and guidewire were removed. A 28 cm palindrome catheter was passed through the peel-away sheath and the peel-away sheath was removed. The tip of the common catheter was positioned the level of the distal right atrium. The catheter was brought through a subcutaneous tunnel through a separate stab incision. The 2 lm ports were attached in both lumens flushed and aspirated easily and were locked with 1000 unit per cc heparin. The catheter was secured to the skin with a 3-0 nylon stitch. The entry site was closed  with a 4-0 subcuticular Vicryl suture. Sterile dressing was applied. Attention was then turned to the left arm. The patient had a small cephalic vein at the antecubital space. The basilic vein was of large caliber. Decision was made through the antecubital space and the cephalic vein was visualized. It was divided and gently dilated was not felt to be adequate for access. The basilic vein was of large caliber. Incision was made below the antecubital space and then 1 in the mid arm and in one of the axilla. The basilic vein was mobilized its entirety. Had multiple large branches and these were all ligated and divided. The vein was ligated and divided distally and was left intact at the axilla. The vein was brought out through the axillary incision. The vein had been marked to reduce the risk of twisting. The vein was gently dilated with heparinized saline was excellent caliber. A subcutaneous tunnel was passed from the level of the brachial artery at antecubital space to the vein at the axilla and the vein was brought through the tunnel. The brachial artery was occluded proximal and distally was opened with 11 blade and extended only with Potts scissors. The vein was cut to appropriate length was spatulated and sewn end-to-side to the brachial artery with a running 6-0 Prolene suture. Clamps removed and excellent thrill was noted. Wounds irrigated with saline. Hemostasis was obtained electrocautery. The wounds were closed with 3-0 Vicryl in the subcutaneous and subcuticular tissue. Benzoin and Steri-Strips were applied. The patient was transferred to the recovery room where chest x-ray was pending   Curt Jews, M.D. 11/23/2015 1:30 PM

## 2015-11-24 ENCOUNTER — Other Ambulatory Visit: Payer: Self-pay

## 2015-11-24 ENCOUNTER — Encounter (HOSPITAL_COMMUNITY): Payer: Self-pay | Admitting: Vascular Surgery

## 2015-11-24 ENCOUNTER — Telehealth: Payer: Self-pay | Admitting: Vascular Surgery

## 2015-11-24 DIAGNOSIS — G4733 Obstructive sleep apnea (adult) (pediatric): Secondary | ICD-10-CM

## 2015-11-24 DIAGNOSIS — D62 Acute posthemorrhagic anemia: Secondary | ICD-10-CM | POA: Insufficient documentation

## 2015-11-24 DIAGNOSIS — M1611 Unilateral primary osteoarthritis, right hip: Secondary | ICD-10-CM

## 2015-11-24 DIAGNOSIS — Z992 Dependence on renal dialysis: Secondary | ICD-10-CM

## 2015-11-24 DIAGNOSIS — R791 Abnormal coagulation profile: Secondary | ICD-10-CM

## 2015-11-24 DIAGNOSIS — Z48812 Encounter for surgical aftercare following surgery on the circulatory system: Secondary | ICD-10-CM

## 2015-11-24 DIAGNOSIS — Z95828 Presence of other vascular implants and grafts: Secondary | ICD-10-CM | POA: Insufficient documentation

## 2015-11-24 DIAGNOSIS — R0682 Tachypnea, not elsewhere classified: Secondary | ICD-10-CM

## 2015-11-24 DIAGNOSIS — N186 End stage renal disease: Secondary | ICD-10-CM

## 2015-11-24 DIAGNOSIS — I5042 Chronic combined systolic (congestive) and diastolic (congestive) heart failure: Secondary | ICD-10-CM

## 2015-11-24 DIAGNOSIS — J90 Pleural effusion, not elsewhere classified: Secondary | ICD-10-CM

## 2015-11-24 DIAGNOSIS — M069 Rheumatoid arthritis, unspecified: Secondary | ICD-10-CM

## 2015-11-24 DIAGNOSIS — I10 Essential (primary) hypertension: Secondary | ICD-10-CM

## 2015-11-24 DIAGNOSIS — Z9889 Other specified postprocedural states: Secondary | ICD-10-CM

## 2015-11-24 DIAGNOSIS — E118 Type 2 diabetes mellitus with unspecified complications: Secondary | ICD-10-CM

## 2015-11-24 DIAGNOSIS — D696 Thrombocytopenia, unspecified: Secondary | ICD-10-CM

## 2015-11-24 LAB — RENAL FUNCTION PANEL
ALBUMIN: 3.4 g/dL — AB (ref 3.5–5.0)
ANION GAP: 12 (ref 5–15)
BUN: 31 mg/dL — ABNORMAL HIGH (ref 6–20)
CO2: 26 mmol/L (ref 22–32)
CREATININE: 5.63 mg/dL — AB (ref 0.61–1.24)
Calcium: 9.2 mg/dL (ref 8.9–10.3)
Chloride: 95 mmol/L — ABNORMAL LOW (ref 101–111)
GFR calc non Af Amer: 9 mL/min — ABNORMAL LOW (ref >60)
GFR, EST AFRICAN AMERICAN: 10 mL/min — AB (ref >60)
GLUCOSE: 118 mg/dL — AB (ref 65–99)
PHOSPHORUS: 4.2 mg/dL (ref 2.5–4.6)
Potassium: 3.6 mmol/L (ref 3.5–5.1)
SODIUM: 133 mmol/L — AB (ref 135–145)

## 2015-11-24 LAB — CBC
HCT: 28.2 % — ABNORMAL LOW (ref 39.0–52.0)
HEMOGLOBIN: 8.7 g/dL — AB (ref 13.0–17.0)
MCH: 26.8 pg (ref 26.0–34.0)
MCHC: 30.9 g/dL (ref 30.0–36.0)
MCV: 86.8 fL (ref 78.0–100.0)
Platelets: 114 10*3/uL — ABNORMAL LOW (ref 150–400)
RBC: 3.25 MIL/uL — AB (ref 4.22–5.81)
RDW: 17.3 % — ABNORMAL HIGH (ref 11.5–15.5)
WBC: 9.5 10*3/uL (ref 4.0–10.5)

## 2015-11-24 LAB — PROTIME-INR
INR: 1.27 (ref 0.00–1.49)
PROTHROMBIN TIME: 16 s — AB (ref 11.6–15.2)

## 2015-11-24 MED ORDER — WARFARIN SODIUM 7.5 MG PO TABS
7.5000 mg | ORAL_TABLET | Freq: Once | ORAL | Status: AC
  Administered 2015-11-24: 7.5 mg via ORAL
  Filled 2015-11-24 (×2): qty 1

## 2015-11-24 NOTE — Progress Notes (Signed)
Late entry:   Postoperative hemodialysis access     Date of Surgery:  11/23/15 Surgeon: Early  Subjective:  C/o some soreness   PHYSICAL EXAMINATION:  Filed Vitals:   11/24/15 1100 11/24/15 1219  BP: 91/47 100/53  Pulse: 58 58  Temp:  98.1 F (36.7 C)  Resp: 21 18    Incision is not inspected as it is bandaged and pt is on HD Sensation in digits is intact;  + palpable left radial artery There is a bruit heard through the bandage The graft/fistula is not palpable due to the bandage   ASSESSMENT/PLAN:  Bruce Cole is a 75 y.o. year old male who is s/p #1 left IJ hemodialysis catheter with SonoSite visualization, #2 left basilic vein transposition fistula.  -graft/fistula is patent -pt does not have evidence of steal sx -f/u with Dr. Donnetta Hutching in 4-6 weeks to check maturation of AVF -was unable to check the incision today as it was bandaged and pt was on HD-will check tomorrow.    Leontine Locket, PA-C [facility] [phone]

## 2015-11-24 NOTE — Progress Notes (Signed)
Patient ID: Bruce Cole, male   DOB: 02-May-1941, 75 y.o.   MRN: [name]  PROGRESS NOTE    Bruce Cole  [name] DOB: Mar 28, 1941 DOA: 11/10/2015  PCP: Nance Pear., NP  Outpatient Specialists: Dr. [name] of nephrology   Brief Narrative:  Per brief narrative 5/7 "75 y.o. male with rheumatoid arthritis, chronic atrial fibrillation on warfarin, chronic systolic CHF, and chronic kidney disease stage IV who presented to the ED at [facility] for evaluation of dyspnea and malaise 4 days duration. Patient has been followed closely by outpatient cardiologist and nephrologist and had AV fistula created in the left upper extremity on 08/26/2015, but has not yet required dialysis.  ED Course: Upon arrival to the [facility] ED, Chest x-ray demonstrated a right sided pleural effusion with an associated opacity in the right base. Transferred to [facility] for admission."  Major events: 4/27 - more dyspneic in the afternoon 4 pm, ordered CXR as pt unable to tolerate CT, PCCM consulted, ABG pend, also call nephrology team again to consider HD sooner than expected 5/4 - pt reports feeling better but still has questions, offered to answer questions, he said he wants to rest and he needs to speak with nephrologist  5/5 - patient still wanting to know what the course of his care will be but is very reluctant to speak to physicians 5/6 - IR thoracentesis tomorrow 5/7 - successful thoracentesis 1.4 L out, reconsulting GI 5/8 - AV fistula placement    Assessment & Plan:  Acute blood loss anemia / Acute upper GI bleed / Anemia of chronic renal failure  - EGD done 4/28, notable for mild gastritis and per GI may cause heme positive stool - Pt is s/p pRBC transfusion 2 Units on admission, 2 Units pRBC 5/6 - Continue protonix 40 mg BID - No further workup per GI - Continue ferric gluconate and aranesp with HD - Hemoglobin remains stable, 8.7  Chronic atrial fibrillation with  supratherapeutic INR, bradycardia  - CHADS-VASc 3 (age, HTN, CH) - Rate controlled with carvedilol 3.125 mg daily - Anticoagulation with Coumadin, dosing per pharmacy  AKI on CKD IV --> ESRD now on HD - S/p fistulogram 5/4, to Or with vascular surgery on 5/8 - AV fistula placed 5/8 - Per renal schedule   Chronic systolic CHF/Volume overload  - TTE (08/18/15) with EF 40-45%  - On HD - Management per renal  Hypertension, essential - Continue coreg 3.125 mg daily   - Blood pressure 100/53 and heart rate 58  Rheumatoid arthritis  - Continue Plaquenil   Right pleural effusion  - CT chest confirms right pleural effusion with extensive passive atelectasis, loculated component of the pleural effusion.  - Thoracentesis on 5/7 1.4 L fluid removed - Postprocedure chest x-ray is clear no acute processes.  Prolonged QTc, resolved - QTc 555 ms on admission, repeat EKG done on 5/5 showed resolution  Obesity  - Body mass index is 34.15 kg/(m^2). - Counsel on diet  Thrombocytopenia - No reports of bleeding - Platelets 114 this morning   DVT prophylaxis: Coumadin, dosing per pharmacy Code Status: full code  Family Communication: family not at the bedside  Disposition Plan: once cleared by nephrology; evaluation by physical therapy for CIR in progress   Consultants:   Nephrology  Cardiology   GI  PCCM  Vascular  Procedures:   HD  AV fistula 11/23/2015  EGD 4/28 - mild gastritis, continue with PPI  HD cath 4/28   Fistulogram  5/4   R U/S guided thoracentesis  Antimicrobials:   None    Subjective: No overnight events. No respiratory distress.  Objective: Filed Vitals:   11/24/15 1000 11/24/15 1030 11/24/15 1100 11/24/15 1219  BP: 93/52 93/54 91/47  100/53  Pulse: 58 56 58 58  Temp:    98.1 F (36.7 C)  TempSrc:    Oral  Resp: 23 28 21 18   Height:      Weight:      SpO2:    100%    Intake/Output Summary (Last 24 hours) at 11/24/15 1241 Last  data filed at 11/24/15 1224  Gross per 24 hour  Intake    340 ml  Output   3600 ml  Net  -3260 ml   Filed Weights   11/23/15 1800 11/24/15 0542 11/24/15 0720  Weight: 104 kg (229 lb 4.5 oz) 104.872 kg (231 lb 3.2 oz) 105.4 kg (232 lb 5.8 oz)    Examination:  General exam: Appears calm and comfortable, no acute distress Respiratory system: Bilateral air entry, no wheezing Cardiovascular system: S1 & S2 appreciated, rate controlled Gastrointestinal system: Appreciate bowel sounds, nontender and nondistended abdomen Central nervous system: Alert and oriented to time, place and person. No focal neurological deficits. Extremities: Strength  5 out of 5 in upper and lower extremities. Left arm fistula  Skin: No rashes, ulcers, skin warm and dry Psychiatry: Mood & affect appropriate.   Data Reviewed: I have personally reviewed following labs and imaging studies  CBC:  Recent Labs Lab 11/19/15 2130 11/20/15 0823 11/21/15 1409 11/22/15 0725 11/23/15 0532 11/24/15 0808  WBC 7.3 7.0 7.6  --  6.9 9.5  HGB 7.3* 7.3* 7.0* 9.1* 9.0* 8.7*  HCT 23.9* 23.6* 23.4* 28.8* 28.8* 28.2*  MCV 89.2 89.1 86.7  --  86.5 86.8  PLT 90* 100* 102*  --  124* [id number]*   Basic Metabolic Panel:  Recent Labs Lab 11/18/15 2201  11/19/15 2130 11/20/15 0823 11/21/15 1409 11/23/15 0532 11/24/15 0808  NA 137  < > 137 136 135 131* 133*  K 4.0  < > 4.1 4.3 3.9 4.3 3.6  CL 99*  < > 98* 95* 97* 94* 95*  CO2 27  < > 27 27 26 25 26   GLUCOSE 114*  < > 111* 90 94 93 118*  BUN 28*  < > 40* 45* 39* 43* 31*  CREATININE 4.91*  < > 6.26* 6.47* 6.37* 6.30* 5.63*  CALCIUM 8.9  < > 9.1 9.3 9.2 9.3 9.2  PHOS 3.3  --  4.1 4.7* 3.5  --  4.2  < > = values in this interval not displayed. GFR: Estimated Creatinine Clearance: 15.6 mL/min (by C-G formula based on Cr of 5.63). Liver Function Tests:  Recent Labs Lab 11/18/15 2201 11/19/15 2130 11/20/15 0823 11/21/15 1409 11/24/15 0808  ALBUMIN 3.4* 3.2* 3.4* 3.1* 3.4*    No results for input(s): LIPASE, AMYLASE in the last 168 hours. No results for input(s): AMMONIA in the last 168 hours. Coagulation Profile:  Recent Labs Lab 11/20/15 0823 11/21/15 0301 11/22/15 0725 11/23/15 0532 11/24/15 0533  INR 1.44 1.42 1.38 1.19 1.27   Cardiac Enzymes: No results for input(s): CKTOTAL, CKMB, CKMBINDEX, TROPONINI in the last 168 hours. BNP (last 3 results) No results for input(s): PROBNP in the last 8760 hours. HbA1C: No results for input(s): HGBA1C in the last 72 hours. CBG: No results for input(s): GLUCAP in the last 168 hours. Lipid Profile: No results for input(s): CHOL, HDL, LDLCALC, TRIG,  CHOLHDL, LDLDIRECT in the last 72 hours. Thyroid Function Tests: No results for input(s): TSH, T4TOTAL, FREET4, T3FREE, THYROIDAB in the last 72 hours. Anemia Panel: No results for input(s): VITAMINB12, FOLATE, FERRITIN, TIBC, IRON, RETICCTPCT in the last 72 hours. Urine analysis:    Component Value Date/Time   COLORURINE YELLOW 11/10/2015 1800   APPEARANCEUR CLEAR 11/10/2015 1800   LABSPEC 1.012 11/10/2015 1800   PHURINE 5.5 11/10/2015 1800   GLUCOSEU NEGATIVE 11/10/2015 1800   GLUCOSEU NEGATIVE 05/05/2015 1135   HGBUR NEGATIVE 11/10/2015 1800   HGBUR negative 09/21/2009 1008   BILIRUBINUR NEGATIVE 11/10/2015 1800   KETONESUR NEGATIVE 11/10/2015 1800   PROTEINUR NEGATIVE 11/10/2015 1800   UROBILINOGEN 0.2 05/05/2015 1135   NITRITE NEGATIVE 11/10/2015 1800   LEUKOCYTESUR NEGATIVE 11/10/2015 1800   Sepsis Labs: @LABRCNTIP (procalcitonin:4,lacticidven:4)  Recent Results (from the past 240 hour(s))  Culture, body fluid-bottle     Status: None (Preliminary result)   Collection Time: 11/22/15  9:16 AM  Result Value Ref Range Status   Specimen Description FLUID PLEURAL RIGHT  Final   Special Requests NONE  Final   Culture NO GROWTH 1 DAY  Final   Report Status PENDING  Incomplete  Gram stain     Status: None   Collection Time: 11/22/15  9:16 AM    Result Value Ref Range Status   Specimen Description FLUID PLEURAL RIGHT  Final   Special Requests NONE  Final   Gram Stain   Final    FEW WBC PRESENT,BOTH PMN AND MONONUCLEAR NO ORGANISMS SEEN    Report Status 11/22/2015 FINAL  Final      Radiology Studies: Dg Chest 1 View 11/22/2015  Stable cardiomegaly. No appreciable edema or consolidation. No pneumothorax. No pleural effusion currently evident. Electronically Signed   By: Lowella Grip III M.D.   On: 11/22/2015 09:45   Dg Chest 2 View 11/20/2015   1. Stable mild cardiomegaly without pulmonary edema. 2. Stable moderate right pleural effusion. 3. Stable patchy right lower lung opacity, favor atelectasis. Electronically Signed   By: Ilona Sorrel M.D.   On: 11/20/2015 14:14   Dg Chest Port 1 View 11/23/2015   Interval placement of left internal jugular dialysis catheter. No pneumothorax is noted. Mild right basilar subsegmental atelectasis or effusion is noted. Electronically Signed   By: Marijo Conception, M.D.   On: 11/23/2015 11:58   US Thoracentesis Asp Pleural Space W/img Guide 11/22/2015  Successful ultrasound guided diagnostic and therapeutic right thoracentesis yielding 1.4 liters of pleural fluid. Read by: Rowe Robert, PA-C Electronically Signed   By: Aletta Edouard M.D.   On: 11/22/2015 10:13      Scheduled Meds: . allopurinol  200 mg Oral Daily  . carvedilol  3.125 mg Oral Daily  . darbepoetin (ARANESP) injection - NON-DIALYSIS  100 mcg Subcutaneous Q Wed-1800  . ferric gluconate (FERRLECIT/NULECIT) IV  125 mg Intravenous Q T,Th,Sa-HD  . fluticasone  2 spray Each Nare Daily  . hydroxychloroquine  200 mg Oral BID  . multivitamin  1 tablet Oral QHS  . pantoprazole  40 mg Oral BID  . sevelamer carbonate  800 mg Oral TID WC  . warfarin  7.5 mg Oral ONCE-1800  . Warfarin - Pharmacist Dosing Inpatient   Does not apply q1800   Continuous Infusions:    LOS: 14 days    Time spent: 15 minutes   Leisa Lenz, MD [facility] Pager [phone]  If 7PM-7AM, please contact night-coverage [email or web address] Password [email or web address] 11/24/2015, 12:41 PM

## 2015-11-24 NOTE — Progress Notes (Addendum)
PT Cancellation Note  Patient Details Name: Bruce Cole MRN: [name] DOB: 1940/12/26   Cancelled Treatment:    Reason Eval/Treat Not Completed: Patient at procedure or test/unavailable, at HD, will follow   Duncan Dull 11/24/2015, 8:08 AM Alben Deeds, PT DPT  [phone]

## 2015-11-24 NOTE — Progress Notes (Addendum)
Discussed with OT. Pt needs to be Mod I to return home for does not have caregiver support. No bed available to admit this pt today. Karene Fry will follow as I will be off remainder of this week. [id number]

## 2015-11-24 NOTE — Evaluation (Signed)
Occupational Therapy Evaluation Patient Details Name: Bruce Cole MRN: [id number] DOB: 1940/10/28 Today's Date: 11/24/2015    History of Present Illness  75 yo male admitted with SOB and malaise.  INR supra-therapeutic at 5.98 and patient had heme positive stools with Hgb 6.5 as well as recurrent right pleural effusion. He was treated with 2 unit PRBC as well as diuresis. Coumadin held and he underwent EGD on 04/28 showing mild gastritis. Large right effusion noted and Dr. Titus Mould recommended thoracocentesis once INR levels drifted down. HD initiated for management of fluid overload as well as SOB due to pulmonary edema. VVS consulte due to poorly functioning fistula and left arm fistulogram with recanalization on 05/04 due to sclerotic fistula with high risk of occlusion. He underwent right thoracocentesis of 1.4 L yellow fluid by IVR. HD ongoing via temp cath and new L-AVF placed on 05/08 by Dr. Trula Slade. . PMH: colon polyps, atrial fibrillation, arthritis, nephrolithiasis, CHF HTN and gout. CKD IV   Clinical Impression   PT admitted with see above. Pt currently with functional limitiations due to the deficits listed below (see OT problem list). PTA pt was living at home alone and completing all adls. Pt will benefit from skilled OT to increase their independence and safety with adls and balance to allow discharge CIR.     Follow Up Recommendations  CIR (recommend SNF if denied CIR)    Equipment Recommendations  None recommended by OT    Recommendations for Other Services Rehab consult     Precautions / Restrictions Precautions Precautions: Fall Precaution Comments: R hip with buckling since January 2017 per patient Restrictions Weight Bearing Restrictions: No      Mobility Bed Mobility Overal bed mobility: Needs Assistance Bed Mobility: Supine to Sit     Supine to sit: Min guard        Transfers Overall transfer level: Needs assistance   Transfers: Sit  to/from Stand Sit to Stand: Min assist;+2 physical assistance         General transfer comment: needed incr time    Balance Overall balance assessment: Needs assistance         Standing balance support: Single extremity supported;During functional activity Standing balance-Leahy Scale: Poor                              ADL Overall ADL's : Needs assistance/impaired Eating/Feeding: Set up   Grooming: Wash/dry face;Wash/dry hands;Minimal assistance;Sitting Grooming Details (indicate cue type and reason): pt requires (A) for balance with static standing Upper Body Bathing: Minimal assitance;Sitting   Lower Body Bathing: Maximal assistance;Sit to/from stand       Lower Body Dressing: Maximal assistance   Toilet Transfer: Minimal assistance;BSC Toilet Transfer Details (indicate cue type and reason): cues for cane placement          Functional mobility during ADLs: +2 for physical assistance;Minimal assistance (initially due to balcane deficits )       Vision     Perception     Praxis      Pertinent Vitals/Pain Pain Assessment: Faces Faces Pain Scale: Hurts little more Pain Location: R hip Pain Descriptors / Indicators: Aching Pain Intervention(s): Limited activity within patient's tolerance;Monitored during session;Repositioned     Hand Dominance     Extremity/Trunk Assessment Upper Extremity Assessment Upper Extremity Assessment: LUE deficits/detail LUE Deficits / Details: 90 degree arom shoulder flexion, elbow 160 degrees limited by new surgical wound.  LUE Coordination: decreased gross  motor   Lower Extremity Assessment Lower Extremity Assessment: Defer to PT evaluation   Cervical / Trunk Assessment Cervical / Trunk Assessment: Normal   Communication Communication Communication: No difficulties   Cognition Arousal/Alertness: Awake/alert Behavior During Therapy: WFL for tasks assessed/performed Overall Cognitive Status: Within  Functional Limits for tasks assessed                     General Comments       Exercises       Shoulder Instructions      Home Living Family/patient expects to be discharged to:: Private residence Living Arrangements: Alone Available Help at Discharge: Friend(s);Available PRN/intermittently Type of Home: House Home Access: Stairs to enter [employer] of Steps: 1   Home Layout: One level     Bathroom Shower/Tub: Teacher, early years/pre: Handicapped height     Home Equipment: Environmental consultant - 2 wheels;Cane - single point;Bedside commode   Additional Comments: pt has DME from previous surg but not using 3n1. pt uses cane regularly. Pt has a friend he can call but no 24/7 (A)       Prior Functioning/Environment Level of Independence: Independent with assistive device(s)        Comments: Pt uses SPC for household ambulation and RW for community ambulation. Drives. Cooks.    OT Diagnosis: Generalized weakness;Acute pain   OT Problem List: Decreased strength;Decreased activity tolerance;Impaired balance (sitting and/or standing);Decreased safety awareness;Decreased knowledge of use of DME or AE;Decreased knowledge of precautions;Pain;Impaired UE functional use   OT Treatment/Interventions: Self-care/ADL training;Therapeutic exercise;DME and/or AE instruction;Therapeutic activities;Patient/family education;Balance training    OT Goals(Current goals can be found in the care plan section) Acute Rehab OT Goals Patient Stated Goal: to return home OT Goal Formulation: With patient Time For Goal Achievement: 12/08/15 Potential to Achieve Goals: Good  OT Frequency: Min 2X/week   Barriers to D/C: Decreased caregiver support          Co-evaluation              End of Session Equipment Utilized During Treatment: Gait belt Nurse Communication: Mobility status;Precautions  Activity Tolerance: Other (comment) (tired from HD prior to  session) Patient left: Other (comment) (With PT DEVON)   [id number] OT Time Calculation (min): 12 min Charges:  OT General Charges $OT Visit: 1 Procedure OT Evaluation $OT Eval Moderate Complexity: 1 Procedure G-Codes:    Parke Poisson B December 24, 2015, 2:46 PM   Jeri Modena   OTR/L Pager: [phone] Office: [phone] .

## 2015-11-24 NOTE — Telephone Encounter (Signed)
sched la 6/2 at 10 and md 6/13 at 8:45. Lm on hm# to inform pt of appt.

## 2015-11-24 NOTE — Telephone Encounter (Signed)
-----   Message from Denman George, RN sent at 11/24/2015  9:15 AM EDT ----- Regarding: needs 4 week f/u with TFE and duples of left arm access   ----- Message -----    From: Gabriel Earing, PA-C    Sent: 11/23/2015  10:30 AM      To: Vvs Charge Pool  S/p left BVT 11/22/15.  F/u with TFE in 4 weeks with duplex.  Thanks, Aldona Bar

## 2015-11-24 NOTE — Progress Notes (Signed)
OT Cancellation Note  Patient Details Name: Bruce Cole MRN: [name] DOB: 1941/06/26   Cancelled Treatment:    Reason Eval/Treat Not Completed: Patient at procedure or test/ unavailable (HD) OT to continue to follow and reattempt at the next most appropriate time.  Parke Poisson B 11/24/2015, 9:55 AM   Jeri Modena   OTR/L Pager: [phone] Office: [phone] .

## 2015-11-24 NOTE — Evaluation (Signed)
Physical Therapy Evaluation Patient Details Name: Bruce Cole MRN: [name] DOB: 1940/12/29 Today's Date: 11/24/2015   History of Present Illness  75 yo male admitted with SOB and malaise. INR supra-therapeutic at 5.98 and patient had heme positive stools with Hgb 6.5 as well as recurrent right pleural effusion. He was treated with 2 unit PRBC as well as diuresis. Coumadin held and he underwent EGD on 04/28 showing mild gastritis. Large right effusion noted and Dr. Titus Mould recommended thoracocentesis once INR levels drifted down. HD initiated for management of fluid overload as well as SOB due to pulmonary edema. VVS consulte due to poorly functioning fistula and left arm fistulogram with recanalization on 05/04 due to sclerotic fistula with high risk of occlusion. He underwent right thoracocentesis of 1.4 L yellow fluid by IVR. HD ongoing via temp cath and new L-AVF placed on 05/08 by Dr. Trula Slade. . PMH: colon polyps, atrial fibrillation, arthritis, nephrolithiasis, CHF HTN and gout. CKD IV  Clinical Impression  Patient demonstrates deficits in functional mobility as indicated below. Will need continued skilled PT to address deficits and maximize function. Will see as indicated and progress as tolerated. OF NOTE: patient was independent prior to admission, at this time patient with mobility impairments and deconditioning related to complexity of hospital course. Highly recommend post acute rehabilitation.       Follow Up Recommendations CIR    Equipment Recommendations  None recommended by PT    Recommendations for Other Services       Precautions / Restrictions Precautions Precautions: Fall Precaution Comments: R hip with buckling since January 2017 per patient Restrictions Weight Bearing Restrictions: No      Mobility  Bed Mobility Overal bed mobility: Needs Assistance Bed Mobility: Supine to Sit     Supine to sit: Min guard        Transfers Overall  transfer level: Needs assistance   Transfers: Sit to/from Stand Sit to Stand: Min assist;+2 physical assistance         General transfer comment: needed incr time  Ambulation/Gait Ambulation/Gait assistance: Min assist Ambulation Distance (Feet): 24 Feet Assistive device: Straight cane;1 person hand held assist Gait Pattern/deviations: Step-to pattern;Decreased stance time - right;Decreased stride length;Antalgic;Narrow base of support Gait velocity: decreased   General Gait Details: instability noted with reliance on both cane as well as therapist support to ambulate short dist  Stairs            Wheelchair Mobility    Modified Rankin (Stroke Patients Only)       Balance Overall balance assessment: Needs assistance         Standing balance support: Single extremity supported;During functional activity Standing balance-Leahy Scale: Poor                               Pertinent Vitals/Pain Pain Assessment: Faces Faces Pain Scale: Hurts little more Pain Location: R hip Pain Descriptors / Indicators: Aching Pain Intervention(s): Limited activity within patient's tolerance;Monitored during session;Repositioned    Home Living Family/patient expects to be discharged to:: Private residence Living Arrangements: Alone Available Help at Discharge: Friend(s);Available PRN/intermittently Type of Home: House Home Access: Stairs to enter   Entrance Stairs-Number of Steps: 1 Home Layout: One level Home Equipment: Walker - 2 wheels;Cane - single point;Bedside commode Additional Comments: pt has DME from previous surg but not using 3n1. pt uses cane regularly. Pt has a friend he can call but no 24/7 (A)  Prior Function Level of Independence: Independent with assistive device(s)         Comments: Pt uses SPC for household ambulation and RW for community ambulation. Drives. Cooks.     Hand Dominance        Extremity/Trunk Assessment   Upper  Extremity Assessment: LUE deficits/detail       LUE Deficits / Details: 90 degree arom shoulder flexion, elbow 160 degrees limited by new surgical wound.    Lower Extremity Assessment: RLE deficits/detail (bilateral LE edema noted) RLE Deficits / Details: limited ROM R LE dur to pain    Cervical / Trunk Assessment: Normal  Communication   Communication: No difficulties  Cognition Arousal/Alertness: Awake/alert Behavior During Therapy: WFL for tasks assessed/performed Overall Cognitive Status: Within Functional Limits for tasks assessed                      General Comments General comments (skin integrity, edema, etc.): dressing dry and intact L UE    Exercises        Assessment/Plan    PT Assessment Patient needs continued PT services  PT Diagnosis Difficulty walking;Abnormality of gait;Generalized weakness   PT Problem List Decreased strength;Decreased activity tolerance;Decreased balance;Decreased mobility;Decreased coordination;Impaired sensation;Pain  PT Treatment Interventions DME instruction;Gait training;Stair training;Functional mobility training;Therapeutic activities;Therapeutic exercise;Balance training;Patient/family education   PT Goals (Current goals can be found in the Care Plan section) Acute Rehab PT Goals Patient Stated Goal: to return home PT Goal Formulation: With patient Time For Goal Achievement: 12/08/15 Potential to Achieve Goals: Good    Frequency Min 3X/week   Barriers to discharge        Co-evaluation               End of Session Equipment Utilized During Treatment: Gait belt Activity Tolerance: Patient limited by fatigue Patient left: in bed;with call bell/phone within reach;with bed alarm set;with family/visitor present Nurse Communication: Mobility status         Time: [id number] PT Time Calculation (min) (ACUTE ONLY): 17 min   Charges:   PT Evaluation $PT Eval Moderate Complexity: 1 Procedure     PT G  CodesDuncan Dull 12-04-15, 4:00 PM Alben Deeds, [place] DPT  [phone]

## 2015-11-24 NOTE — Progress Notes (Signed)
We await therapy evaluations before being able to assist with rehab dispo. [id number]

## 2015-11-24 NOTE — Progress Notes (Signed)
Pt had 6 bts nonsustained VT on tele.  Pt asymptomatic.  Will continue to monitor.

## 2015-11-24 NOTE — Progress Notes (Signed)
ANTICOAGULATION CONSULT NOTE - Follow Up Consult  Pharmacy Consult for Coumadin Indication: atrial fibrillation  Allergies  Allergen Reactions  . Ace Inhibitors Other (See Comments)    Worsening renal insufficiency    Patient Measurements: Height: 6\' 3"  (190.5 cm) Weight: 232 lb 5.8 oz (105.4 kg) IBW/kg (Calculated) : 84.5  Vital Signs: Temp: 97.4 F (36.3 C) (05/09 0720) Temp Source: Oral (05/09 0720) BP: 104/53 mmHg (05/09 0934) Pulse Rate: 52 (05/09 0934)  Labs:  Recent Labs  11/21/15 1409 11/22/15 0725 11/23/15 0532 11/24/15 0533 11/24/15 0808  HGB 7.0* 9.1* 9.0*  --  8.7*  HCT 23.4* 28.8* 28.8*  --  28.2*  PLT 102*  --  124*  --  114*  LABPROT  --  17.1* 15.3* 16.0*  --   INR  --  1.38 1.19 1.27  --   CREATININE 6.37*  --  6.30*  --  5.63*    Estimated Creatinine Clearance: 15.6 mL/min (by C-G formula based on Cr of 5.63).  Assessment: 74yom on coumadin pta for afib. INR on admit 4/25 was 5.58 and coumadin placed on hold for possible GIB. Underwent endoscopy 4/28 which showed mild gastritis but otherwise normal. Coumadin remained on hold for left arm fistulogram 5/4 and left IJ HD cath/AV fistula 5/8. INR 1.27 (no reversal agents given). Coumadin resumed 5/8.  Home dose: 7.5mg  daily  Goal of Therapy:  INR 2-3 Monitor platelets by anticoagulation protocol: Yes   Plan:  1) Warfarin 7.5mg  x 1 2) Monitor daily INR, CBC, clinical course, s/sx of bleed, PO intake, DDI   Thank you for allowing [place] to participate in this patients care. Jens Som, PharmD Pager: [phone] 11/24/2015,10:37 AM

## 2015-11-24 NOTE — Procedures (Signed)
I have personally attended this patient's dialysis session.   UF goal 3 liters New left [facility] working well. Labs pending 2K bath for now Bruce Epstein, NP low 100's Pre weight 104.8  Jamal Maes, MD [facility] [phone] Pager 11/24/2015, 8:00 AM

## 2015-11-24 NOTE — Progress Notes (Signed)
[facility] Rounding Note Subjective/Interval History:  Seen in hemodialysis Breathing well, no steal sx from yesterday's access procedure 6 beat run non-sustained VT last PM Brady in HD today  Objective: Vital signs in last 24 hours: Temp:  [97.3 F (36.3 C)-98.7 F (37.1 C)] 98.1 F (36.7 C) (05/09 0542) Pulse Rate:  [51-58] 55 (05/08 2100) Resp:  [10-29] 16 (05/09 0542) BP: (92-115)/(53-68) 98/64 mmHg (05/09 0542) SpO2:  [98 %-100 %] 99 % (05/09 0542) Weight:  [104 kg (229 lb 4.5 oz)-107.5 kg (236 lb 15.9 oz)] 104.872 kg (231 lb 3.2 oz) (05/09 0542) Weight change: 1.721 kg (3 lb 12.7 oz)  Intake/Output from previous day: 05/08 0701 - 05/09 0700 In: 570 [P.O.:120; I.V.:450] Out: 3615 [Urine:100; Blood:15] Intake/Output this shift:    Physical Examination Awake, alert, no complaints Lungs anteriorly clear Bradycardic 50's S1S2 No S3 Abd soft and not tender 2-3+ edema both LE's L>R L AVF with + bruit, hand warm, no steal. Dressing still in place and not removed Left IJ TDC running well. Some bruising ant chest wall from previous R sided temp cath  Lab Results:  Recent Labs  11/21/15 1409 11/22/15 0725 11/23/15 0532  WBC 7.6  --  6.9  HGB 7.0* 9.1* 9.0*  HCT 23.4* 28.8* 28.8*  PLT 102*  --  124*   BMET:   Recent Labs  11/21/15 1409 11/23/15 0532  NA 135 131*  K 3.9 4.3  CL 97* 94*  CO2 26 25  GLUCOSE 94 93  BUN 39* 43*  CREATININE 6.37* 6.30*  CALCIUM 9.2 9.3   Lab Results  Component Value Date   INR 1.27 11/24/2015   INR 1.19 11/23/2015   INR 1.38 11/22/2015    Studies/Results: Dg Chest 1 View  11/22/2015  CLINICAL DATA:  Status post right-sided thoracentesis EXAM: CHEST 1 VIEW COMPARISON:  Nov 20, 2015 FINDINGS: There has been interval resolution of right pleural effusion. No pneumothorax evident. Central catheter tip in superior vena cava. No edema or consolidation. Heart remains enlarged with pulmonary vascularity within normal limits. No  adenopathy. IMPRESSION: Stable cardiomegaly. No appreciable edema or consolidation. No pneumothorax. No pleural effusion currently evident. Electronically Signed   By: Lowella Grip III M.D.   On: 11/22/2015 09:45   Dg Chest Port 1 View  11/23/2015  CLINICAL DATA:  Status post dialysis catheter insertion. EXAM: PORTABLE CHEST 1 VIEW COMPARISON:  Nov 22, 2015. FINDINGS: Stable cardiomegaly. Interval placement of internal jugular dialysis catheter with distal tip in the expected position of the right atrium. No pneumothorax is noted. Left lung is clear. Mild right basilar opacity is noted most consistent with atelectasis or pleural effusion. Bony thorax is unremarkable. IMPRESSION: Interval placement of left internal jugular dialysis catheter. No pneumothorax is noted. Mild right basilar subsegmental atelectasis or effusion is noted. Electronically Signed   By: Marijo Conception, M.D.   On: 11/23/2015 11:58   US Thoracentesis Asp Pleural Space W/img Guide  11/22/2015  INDICATION: End-stage renal disease, CHF, dyspnea, recurrent right pleural effusion. Request made for diagnostic and therapeutic right thoracentesis. EXAM: ULTRASOUND GUIDED DIAGNOSTIC AND THERAPEUTIC RIGHT THORACENTESIS MEDICATIONS: None. COMPLICATIONS: None immediate. PROCEDURE: An ultrasound guided thoracentesis was thoroughly discussed with the patient and questions answered. The benefits, risks, alternatives and complications were also discussed. The patient understands and wishes to proceed with the procedure. Written consent was obtained. Ultrasound was performed to localize and mark an adequate pocket of fluid in the right chest. The area was then prepped and draped  in the normal sterile fashion. 1% Lidocaine was used for local anesthesia. Under ultrasound guidance a Safe-T-Centesis catheter was introduced. Thoracentesis was performed. The catheter was removed and a dressing applied. FINDINGS: A total of approximately 1.4 liters of yellow  fluid was removed. Samples were sent to the laboratory as requested by the clinical team. IMPRESSION: Successful ultrasound guided diagnostic and therapeutic right thoracentesis yielding 1.4 liters of pleural fluid. Read by: Rowe Robert, PA-C Electronically Signed   By: Aletta Edouard M.D.   On: 11/22/2015 10:13    Scheduled: . allopurinol  200 mg Oral Daily  . carvedilol  3.125 mg Oral Daily  . darbepoetin (ARANESP) injection - NON-DIALYSIS  100 mcg Subcutaneous Q Wed-1800  . ferric gluconate (FERRLECIT/NULECIT) IV  125 mg Intravenous Q T,Th,Sa-HD  . fluticasone  2 spray Each Nare Daily  . hydroxychloroquine  200 mg Oral BID  . multivitamin  1 tablet Oral QHS  . pantoprazole  40 mg Oral BID  . sevelamer carbonate  800 mg Oral TID WC  . Warfarin - Pharmacist Dosing Inpatient   Does not apply q1800   Assessment/Plan:  1. ESRD, new start- 1st HD 4/27. Clipped to TTS [employer] second shift. HD today to get on outpt schedule. EDW not yet established. UF goal today 3-4 liters. Pre weight 104.8 kg (down from max of 125 kg) 2. S/p left upper arm AVF and TDC placement (5/8) - catheter working well, no steal, + bruit in AVF  3. S/p ABLA- due to gastritis in setting of anticoagulation. Transfused PRBC's  (2 units 4/26, 2 units 5/7) 4. Anemia of ESRD (+ABLA) tsat on 11/12/15 was 4%. Has not received any iron. Load with HD X 1 gram total 5. CKD-MBD - check PTH. On binders. Phos looks fine 6. CHF/Volume overload- cont with UF with HD---improved.Still probably 10+ lb volume to remove. Weight down from 123 kg to nadir of 105 kg so far  7. R pleural effusion - 1.5 liters off 4.7 thoracentesis 8. A fib on coumadin -  Coumadin was on hold for GIB, has been resumed. INR not yet therapeutic. 9. Bradycardia - remains on small dose coreg  10. Chronic systolic CHF - EF [id number]. CHF sx much improved w/vol removal with HD and thoracentesis 11. Right Pleural effusion s/p Thoracentesis 1.5L on 5/7 -  symptomaticcally better 12. RA on plaquenil 13. Disposition - lives alone. Rehab consult pending.   Jamal Maes, MD [facility] [phone] Pager 11/24/2015, 7:58 AM

## 2015-11-25 ENCOUNTER — Other Ambulatory Visit: Payer: Self-pay | Admitting: Family

## 2015-11-25 ENCOUNTER — Telehealth: Payer: Self-pay | Admitting: Family

## 2015-11-25 DIAGNOSIS — D508 Other iron deficiency anemias: Secondary | ICD-10-CM | POA: Diagnosis not present

## 2015-11-25 DIAGNOSIS — I482 Chronic atrial fibrillation: Secondary | ICD-10-CM | POA: Diagnosis not present

## 2015-11-25 DIAGNOSIS — R2681 Unsteadiness on feet: Secondary | ICD-10-CM | POA: Diagnosis not present

## 2015-11-25 DIAGNOSIS — J309 Allergic rhinitis, unspecified: Secondary | ICD-10-CM | POA: Diagnosis not present

## 2015-11-25 DIAGNOSIS — Z7901 Long term (current) use of anticoagulants: Secondary | ICD-10-CM | POA: Diagnosis not present

## 2015-11-25 DIAGNOSIS — I130 Hypertensive heart and chronic kidney disease with heart failure and stage 1 through stage 4 chronic kidney disease, or unspecified chronic kidney disease: Secondary | ICD-10-CM | POA: Diagnosis not present

## 2015-11-25 DIAGNOSIS — R262 Difficulty in walking, not elsewhere classified: Secondary | ICD-10-CM | POA: Diagnosis not present

## 2015-11-25 DIAGNOSIS — K5901 Slow transit constipation: Secondary | ICD-10-CM | POA: Diagnosis not present

## 2015-11-25 DIAGNOSIS — R278 Other lack of coordination: Secondary | ICD-10-CM | POA: Diagnosis not present

## 2015-11-25 DIAGNOSIS — D649 Anemia, unspecified: Secondary | ICD-10-CM | POA: Diagnosis not present

## 2015-11-25 DIAGNOSIS — I129 Hypertensive chronic kidney disease with stage 1 through stage 4 chronic kidney disease, or unspecified chronic kidney disease: Secondary | ICD-10-CM | POA: Diagnosis not present

## 2015-11-25 DIAGNOSIS — D62 Acute posthemorrhagic anemia: Secondary | ICD-10-CM | POA: Diagnosis not present

## 2015-11-25 DIAGNOSIS — Z992 Dependence on renal dialysis: Secondary | ICD-10-CM | POA: Diagnosis not present

## 2015-11-25 DIAGNOSIS — N184 Chronic kidney disease, stage 4 (severe): Secondary | ICD-10-CM | POA: Diagnosis not present

## 2015-11-25 DIAGNOSIS — K922 Gastrointestinal hemorrhage, unspecified: Secondary | ICD-10-CM | POA: Diagnosis not present

## 2015-11-25 DIAGNOSIS — J90 Pleural effusion, not elsewhere classified: Secondary | ICD-10-CM | POA: Diagnosis not present

## 2015-11-25 DIAGNOSIS — M109 Gout, unspecified: Secondary | ICD-10-CM | POA: Diagnosis not present

## 2015-11-25 DIAGNOSIS — I509 Heart failure, unspecified: Secondary | ICD-10-CM | POA: Diagnosis not present

## 2015-11-25 DIAGNOSIS — M6281 Muscle weakness (generalized): Secondary | ICD-10-CM | POA: Diagnosis not present

## 2015-11-25 DIAGNOSIS — K2901 Acute gastritis with bleeding: Secondary | ICD-10-CM | POA: Diagnosis not present

## 2015-11-25 DIAGNOSIS — I10 Essential (primary) hypertension: Secondary | ICD-10-CM | POA: Diagnosis not present

## 2015-11-25 DIAGNOSIS — N179 Acute kidney failure, unspecified: Secondary | ICD-10-CM | POA: Diagnosis not present

## 2015-11-25 DIAGNOSIS — N186 End stage renal disease: Secondary | ICD-10-CM | POA: Diagnosis not present

## 2015-11-25 DIAGNOSIS — M1611 Unilateral primary osteoarthritis, right hip: Secondary | ICD-10-CM | POA: Diagnosis not present

## 2015-11-25 DIAGNOSIS — I5042 Chronic combined systolic (congestive) and diastolic (congestive) heart failure: Secondary | ICD-10-CM | POA: Diagnosis not present

## 2015-11-25 DIAGNOSIS — Z5189 Encounter for other specified aftercare: Secondary | ICD-10-CM | POA: Diagnosis not present

## 2015-11-25 DIAGNOSIS — J948 Other specified pleural conditions: Secondary | ICD-10-CM | POA: Diagnosis not present

## 2015-11-25 DIAGNOSIS — M069 Rheumatoid arthritis, unspecified: Secondary | ICD-10-CM | POA: Diagnosis not present

## 2015-11-25 DIAGNOSIS — E8770 Fluid overload, unspecified: Secondary | ICD-10-CM | POA: Diagnosis not present

## 2015-11-25 DIAGNOSIS — N189 Chronic kidney disease, unspecified: Secondary | ICD-10-CM | POA: Diagnosis not present

## 2015-11-25 DIAGNOSIS — R5381 Other malaise: Secondary | ICD-10-CM | POA: Diagnosis not present

## 2015-11-25 LAB — TYPE AND SCREEN
ABO/RH(D): O POS
Antibody Screen: NEGATIVE
UNIT DIVISION: 0
UNIT DIVISION: 0
UNIT DIVISION: 0
Unit division: 0
Unit division: 0
Unit division: 0

## 2015-11-25 LAB — PROTIME-INR
INR: 1.37 (ref 0.00–1.49)
PROTHROMBIN TIME: 17 s — AB (ref 11.6–15.2)

## 2015-11-25 LAB — PARATHYROID HORMONE, INTACT (NO CA): PTH: 104 pg/mL — ABNORMAL HIGH (ref 15–65)

## 2015-11-25 MED ORDER — PANTOPRAZOLE SODIUM 40 MG PO TBEC: 40.0000 mg | DELAYED_RELEASE_TABLET | Freq: Two times a day (BID) | ORAL | Status: DC

## 2015-11-25 MED ORDER — SENNA 8.6 MG PO TABS: 1.0000 | ORAL_TABLET | Freq: Every day | ORAL | Status: DC | PRN

## 2015-11-25 MED ORDER — ALLOPURINOL 100 MG PO TABS: 200.0000 mg | ORAL_TABLET | Freq: Every day | ORAL | Status: DC

## 2015-11-25 MED ORDER — HYDROCODONE-ACETAMINOPHEN 5-325 MG PO TABS: 1.0000 | ORAL_TABLET | ORAL | Status: DC | PRN

## 2015-11-25 MED ORDER — WARFARIN SODIUM 7.5 MG PO TABS: 7.5000 mg | ORAL_TABLET | Freq: Once | ORAL | Status: DC

## 2015-11-25 MED ORDER — PROMETHAZINE HCL 25 MG PO TABS: 25.0000 mg | ORAL_TABLET | Freq: Four times a day (QID) | ORAL | Status: DC | PRN

## 2015-11-25 MED ORDER — SEVELAMER CARBONATE 800 MG PO TABS: 800.0000 mg | ORAL_TABLET | Freq: Three times a day (TID) | ORAL | Status: DC

## 2015-11-25 MED ORDER — WARFARIN SODIUM 7.5 MG PO TABS
7.5000 mg | ORAL_TABLET | Freq: Once | ORAL | Status: DC
Start: 2015-11-25 — End: 2015-12-07

## 2015-11-25 NOTE — Discharge Summary (Signed)
Physician Discharge Summary  Bruce Cole [name] DOB: 01-30-1941 DOA: 11/10/2015  PCP: Nance Pear., NP  Admit date: 11/10/2015 Discharge date: 11/25/2015  Recommendations for Outpatient Follow-up:  Continue hemodialysis on oputpt basis Continue coumadin 7.5 mg tonight on 11/25/2015 and please continue to monitor INR per SNF protocol and adjust coumadin dose as needed to reflect INR range 2-3.  Platelet count is 114 at the time of discharge. Please continue to monitor platelet count to skilled nursing facility and is composite the pineal worse since, such as platelet count is less than 50 please stop Coumadin.   Discharge Diagnoses:  Principal Problem:   Symptomatic anemia Active Problems:   Essential hypertension   Rheumatoid arthritis ([place])   Diabetes mellitus type 2, controlled, with complications (HCC)   CKD (chronic kidney disease), stage IV (HCC)   Chronic combined systolic and diastolic heart failure (HCC)   Acute renal failure superimposed on stage 3 chronic kidney disease (HCC)   Chronic atrial fibrillation (HCC)   AKI (acute kidney injury) (HCC)   Cardiorenal disease   GI bleed   Supratherapeutic INR   Heme positive stool   Coagulopathy (HCC)   Gastritis and gastroduodenitis   Pleural effusion   Tachypnea   Acute blood loss anemia   Primary osteoarthritis of right hip   Thrombocytopenia (HCC)   S/P dialysis catheter insertion ([place])    Discharge Condition: stable   Diet recommendation: as tolerated   History of present illness:  Per brief narrative 5/7 "75 y.o. male with rheumatoid arthritis, chronic atrial fibrillation on warfarin, chronic systolic CHF, and chronic kidney disease stage IV who presented to the ED at [facility] for evaluation of dyspnea and malaise 4 days duration. Patient has been followed closely by outpatient cardiologist and nephrologist and had AV fistula created in the left upper extremity on 08/26/2015,  but has not yet required dialysis.  ED Course: Upon arrival to the [facility] ED, Chest x-ray demonstrated a right sided pleural effusion with an associated opacity in the right base. Transferred to [facility] for admission."  Major events: 4/27 - more dyspneic in the afternoon 4 pm, ordered CXR as pt unable to tolerate CT, PCCM consulted, ABG pend, also call nephrology team again to consider HD sooner than expected 5/4 - pt reports feeling better but still has questions, offered to answer questions, he said he wants to rest and he needs to speak with nephrologist  5/5 - patient still wanting to know what the course of his care will be but is very reluctant to speak to physicians 5/6 - IR thoracentesis tomorrow 5/7 - successful thoracentesis 1.4 L out, reconsulting GI 5/8 - AV fistula placement   Hospital Course:   Assessment & Plan:  Acute blood loss anemia / Acute upper GI bleed / Anemia of chronic renal failure  - EGD done 4/28, notable for mild gastritis and per GI may cause heme positive stool - Pt is s/p pRBC transfusion 2 Units on admission, 2 Units pRBC 5/6 - Continue protonix 40 mg BID - No further workup per GI - Continue ferric gluconate and aranesp with HD - Hemoglobin remains stable, 8.7  Chronic atrial fibrillation with supratherapeutic INR, bradycardia  - CHADS-VASc 3 (age, HTN, CH) - Rate controlled with carvedilol 3.125 mg daily - Anticoagulation with Coumadin, tonight 5/10 pt to get 17.5 mg coumadin - INR 1.37 this am  AKI on CKD IV --> ESRD now on HD - S/p fistulogram 5/4, to [place] with vascular  surgery on 5/8 - AV fistula placed 5/8 - Per renal schedule TTSat  Chronic systolic CHF/Volume overload  - TTE (08/18/15) with EF 40-45%  - On HD - Management per renal  Hypertension, essential - Continue coreg 3.125 mg daily   Rheumatoid arthritis  - Continue Plaquenil   Right pleural effusion  - CT chest confirms right pleural effusion with extensive passive  atelectasis, loculated component of the pleural effusion.  - Thoracentesis on 5/7 1.4 L fluid removed - Postprocedure chest x-ray is clear with no acute processes.  Prolonged QTc, resolved - QTc 555 ms on admission, repeat EKG done on 5/5 showed resolution  Obesity  - Body mass index is 34.15 kg/(m^2). - Counsel on diet  Thrombocytopenia - No reports of bleeding - Platelets 114, stable   DVT prophylaxis: Full anticoagulation with Coumadin Code Status: full code  Family Communication: family not at the bedside     Consultants:   Nephrology  Cardiology   GI  PCCM  Vascular  Procedures:   HD  AV fistula 11/23/2015  EGD 4/28 - mild gastritis, continue with PPI  HD cath 4/28   Fistulogram 5/4   R U/S guided thoracentesis  Antimicrobials:   None    Signed:  Leisa Lenz, MD  [facility] 11/25/2015, 10:37 AM  Pager #: [phone]  Time spent in minutes: more than 30 minutes   Discharge Exam: Filed Vitals:   11/24/15 2034 11/25/15 0500  BP: 91/60 94/55  Pulse: 58 58  Temp: 98.8 F (37.1 C) 98.1 F (36.7 C)  Resp: 18 18   Filed Vitals:   11/24/15 1130 11/24/15 1219 11/24/15 2034 11/25/15 0500  BP: 91/53 100/53 91/60 94/55   Pulse: 56 58 58 58  Temp: 97.1 F (36.2 C) 98.1 F (36.7 C) 98.8 F (37.1 C) 98.1 F (36.7 C)  TempSrc:  Oral Oral Oral  Resp: 19 18 18 18   Height:      Weight: 102.7 kg (226 lb 6.6 oz)   103.738 kg (228 lb 11.2 oz)  SpO2:  100% 99% 96%    General: Pt is alert, follows commands appropriately, not in acute distress Cardiovascular: Regular rate and rhythm, S1/S2 + Respiratory: Clear to auscultation bilaterally, no wheezing, no crackles, no rhonchi Abdominal: Soft, non tender, non distended, bowel sounds +, no guarding Extremities: no cyanosis, pulses palpable bilaterally DP and PT, left arm fistula Neuro: Grossly nonfocal  Discharge Instructions  Discharge Instructions    Call MD for:  difficulty  breathing, headache or visual disturbances    Complete by:  As directed      Call MD for:  persistant dizziness or light-headedness    Complete by:  As directed      Call MD for:  persistant nausea and vomiting    Complete by:  As directed      Call MD for:  severe uncontrolled pain    Complete by:  As directed      Diet - low sodium heart healthy    Complete by:  As directed      Discharge instructions    Complete by:  As directed   Continue hemodialysis on oputpt basis Continue coumadin 7.5 mg tonight on 11/25/2015 and please continue to monitor INR per SNF protocol and adjust coumadin dose as needed to reflect INR range 2-3.     Increase activity slowly    Complete by:  As directed             Medication List  STOP taking these medications        colchicine 0.6 MG tablet     furosemide 80 MG tablet  Commonly known as:  LASIX     isosorbide mononitrate 30 MG 24 hr tablet  Commonly known as:  IMDUR     metolazone 5 MG tablet  Commonly known as:  ZAROXOLYN     potassium chloride SA 20 MEQ tablet  Commonly known as:  K-DUR,KLOR-CON     terazosin 1 MG capsule  Commonly known as:  HYTRIN      TAKE these medications        albuterol (2.5 MG/3ML) 0.083% nebulizer solution  Commonly known as:  PROVENTIL  Take 2.5 mg by nebulization every 4 (four) hours as needed for shortness of breath. Reported on 09/25/2015     allopurinol 100 MG tablet  Commonly known as:  ZYLOPRIM  Take 2 tablets (200 mg total) by mouth daily.     carvedilol 3.125 MG tablet  Commonly known as:  COREG  Take 3.125 mg by mouth daily.     fluticasone 50 MCG/ACT nasal spray  Commonly known as:  FLONASE  Place 2 sprays into both nostrils daily.     hydrALAZINE 50 MG tablet  Commonly known as:  APRESOLINE  Take 50 mg by mouth 2 (two) times daily.     HYDROcodone-acetaminophen 5-325 MG tablet  Commonly known as:  NORCO/VICODIN  Take 1-2 tablets by mouth every 4 (four) hours as needed for  moderate pain.     hydroxychloroquine 200 MG tablet  Commonly known as:  PLAQUENIL  Take 200 mg by mouth 2 (two) times daily.     multivitamin with minerals Tabs tablet  Take 1 tablet by mouth daily.     pantoprazole 40 MG tablet  Commonly known as:  PROTONIX  Take 1 tablet (40 mg total) by mouth 2 (two) times daily.     promethazine 25 MG tablet  Commonly known as:  PHENERGAN  Take 1 tablet (25 mg total) by mouth every 6 (six) hours as needed for nausea or vomiting.     senna 8.6 MG Tabs tablet  Commonly known as:  SENOKOT  Take 1 tablet (8.6 mg total) by mouth daily as needed for mild constipation or moderate constipation.     sevelamer carbonate 800 MG tablet  Commonly known as:  RENVELA  Take 1 tablet (800 mg total) by mouth 3 (three) times daily with meals.     warfarin 7.5 MG tablet  Commonly known as:  COUMADIN  Take 1 tablet (7.5 mg total) by mouth one time only at 6 PM.           Follow-up Information    Follow up with Curt Jews, MD In 4 weeks.   Specialties:  Vascular Surgery, Cardiology   Why:  Office will call you to arrange your appt (sent)   Contact information:   [place] [address] [phone]       Follow up with Nance Pear., NP. Schedule an appointment as soon as possible for a visit in 2 weeks.   Specialty:  Internal Medicine   Why:  Follow up appt after recent hospitalization   Contact information:   [address] [place] [address] [phone]        The results of significant diagnostics from this hospitalization (including imaging, microbiology, ancillary and laboratory) are listed below for reference.    Significant Diagnostic Studies: Dg Chest 1  View  11/22/2015  CLINICAL DATA:  Status post right-sided thoracentesis EXAM: CHEST 1 VIEW COMPARISON:  Nov 20, 2015 FINDINGS: There has been interval resolution of right pleural effusion. No pneumothorax evident. Central catheter tip in superior  vena cava. No edema or consolidation. Heart remains enlarged with pulmonary vascularity within normal limits. No adenopathy. IMPRESSION: Stable cardiomegaly. No appreciable edema or consolidation. No pneumothorax. No pleural effusion currently evident. Electronically Signed   By: Lowella Grip III M.D.   On: 11/22/2015 09:45   Dg Chest 2 View  11/20/2015  CLINICAL DATA:  Pleural effusion.  Shortness of breath.  Chest pain. EXAM: CHEST  2 VIEW COMPARISON:  11/12/2015 chest radiograph. FINDINGS: Right internal jugular central venous catheter terminates in the right upper mediastinum, unchanged. Stable cardiomediastinal silhouette with mild cardiomegaly. No pneumothorax. Moderate right pleural effusion is probably unchanged accounting for differences in inspiration. No left pleural effusion. No pulmonary edema. Patchy right lower lung opacity, not appreciably changed. IMPRESSION: 1. Stable mild cardiomegaly without pulmonary edema. 2. Stable moderate right pleural effusion. 3. Stable patchy right lower lung opacity, favor atelectasis. Electronically Signed   By: Ilona Sorrel M.D.   On: 11/20/2015 14:14   Ct Chest Wo Contrast  11/12/2015  CLINICAL DATA:  Shortness of breath. Wheezing. Stage 4 chronic kidney disease. Pleural effusion. EXAM: CT CHEST WITHOUT CONTRAST TECHNIQUE: Multidetector CT imaging of the chest was performed following the standard protocol without IV contrast. COMPARISON:  11/10/2015 and 08/18/2015 FINDINGS: Mediastinum/Nodes: Prominent contour systemic venous structures. Cardiomegaly, especially involving the right atrium. Low-density along the medial margin of the right auricle as on image 64 series 2, I am uncertain whether this represents the dentated margin or a filling defect such as thrombus. Descending aortic aneurysm, 4.7 cm in the posterior arch. Scattered small lymph nodes. Flattened and nearly occluded appearance of the right mainstem bronchus and bronchus intermedius. There is  some loculation of the right pleural effusion along the right anterior mediastinal border. Lungs/Pleura: Large right pleural effusion filling about 70% of the right hemithorax, with associated atelectasis of much of the right lung. Suspected loculated components. There is mild atelectasis in the left lower lobe with airway thickening and near occlusion of left lower lobe segmental bronchi. Upper abdomen: Part of the right posterior thoracic cavity is excluded as it extends well below the left side. Trace perisplenic ascites. Musculoskeletal: Considerable thoracic spondylosis and degenerative disc disease. Bilateral gynecomastia. Diffuse subcutaneous edema. IMPRESSION: 1. Prominent systemic venous structures favoring elevated right heart pressures. Prominent right atrial enlargement and moderate enlargement of the rest of the heart. 2. I am unsure if the low-density posteriorly into the right in the right auricle represents the dentate margin of the right auricle or thrombus. Consider echocardiography with attention to this region. 3. Large right pleural effusion with extensive passive atelectasis. Loculated component of the pleural effusion anteriorly. 4. Prominent airway thickening and near collapse in the right mainstem bronchus, right bronchus intermedius, and in the left lower lobe bronchi. This raises suspicion for the possibility of bronchomalacia. 5. Diffuse subcutaneous edema and mild perisplenic ascites, likely due to diffuse third spacing of fluid. There is also bilateral gynecomastia. 6. Stable aneurysmal thoracic aorta, measuring up to 4.7 cm along the posterior arch. Electronically Signed   By: Van Clines M.D.   On: 11/12/2015 17:00   Dg Chest Port 1 View  11/23/2015  CLINICAL DATA:  Status post dialysis catheter insertion. EXAM: PORTABLE CHEST 1 VIEW COMPARISON:  Nov 22, 2015. FINDINGS: Stable cardiomegaly.  Interval placement of internal jugular dialysis catheter with distal tip in the  expected position of the right atrium. No pneumothorax is noted. Left lung is clear. Mild right basilar opacity is noted most consistent with atelectasis or pleural effusion. Bony thorax is unremarkable. IMPRESSION: Interval placement of left internal jugular dialysis catheter. No pneumothorax is noted. Mild right basilar subsegmental atelectasis or effusion is noted. Electronically Signed   By: Marijo Conception, M.D.   On: 11/23/2015 11:58   Dg Chest Port 1 View  11/12/2015  CLINICAL DATA:  Central line placement EXAM: PORTABLE CHEST 1 VIEW COMPARISON:  Multiple exams, including 11/10/2015 FINDINGS: Right IJ line tip is in the lower internal jugular vein, immediately above the brachiocephalic confluence. If SVC placement is desired consider advancing 3 cm. Stable large right pleural effusion with passive atelectasis. Stable cardiomegaly. IMPRESSION: 1. Right IJ line tip is projects in the lower right internal jugular vein, just above the brachiocephalic confluence. If SVC placement is desired, advanced 3 cm. 2. Stable large right pleural effusion and stable cardiomegaly. Electronically Signed   By: Van Clines M.D.   On: 11/12/2015 18:34   Dg Chest Port 1 View  11/10/2015  CLINICAL DATA:  Short of breath for 4 day EXAM: PORTABLE CHEST 1 VIEW COMPARISON:  08/19/2015 FINDINGS: The heart is moderately enlarged with a globular appearance. Moderate right pleural effusion and associated pulmonary opacity in the right mid and lower lung zones has developed. There is no pneumothorax. There is no pulmonary edema. There is no pneumothorax or vascular congestion. IMPRESSION: Right pleural effusion and basilar pulmonary opacity. Cardiomegaly is noted. Electronically Signed   By: Marybelle Killings M.D.   On: 11/10/2015 16:12   US Thoracentesis Asp Pleural Space W/img Guide  11/22/2015  INDICATION: End-stage renal disease, CHF, dyspnea, recurrent right pleural effusion. Request made for diagnostic and therapeutic right  thoracentesis. EXAM: ULTRASOUND GUIDED DIAGNOSTIC AND THERAPEUTIC RIGHT THORACENTESIS MEDICATIONS: None. COMPLICATIONS: None immediate. PROCEDURE: An ultrasound guided thoracentesis was thoroughly discussed with the patient and questions answered. The benefits, risks, alternatives and complications were also discussed. The patient understands and wishes to proceed with the procedure. Written consent was obtained. Ultrasound was performed to localize and mark an adequate pocket of fluid in the right chest. The area was then prepped and draped in the normal sterile fashion. 1% Lidocaine was used for local anesthesia. Under ultrasound guidance a Safe-T-Centesis catheter was introduced. Thoracentesis was performed. The catheter was removed and a dressing applied. FINDINGS: A total of approximately 1.4 liters of yellow fluid was removed. Samples were sent to the laboratory as requested by the clinical team. IMPRESSION: Successful ultrasound guided diagnostic and therapeutic right thoracentesis yielding 1.4 liters of pleural fluid. Read by: Rowe Robert, PA-C Electronically Signed   By: Aletta Edouard M.D.   On: 11/22/2015 10:13    Microbiology: Recent Results (from the past 240 hour(s))  Culture, body fluid-bottle     Status: None (Preliminary result)   Collection Time: 11/22/15  9:16 AM  Result Value Ref Range Status   Specimen Description FLUID PLEURAL RIGHT  Final   Special Requests NONE  Final   Culture NO GROWTH 2 DAYS  Final   Report Status PENDING  Incomplete  Gram stain     Status: None   Collection Time: 11/22/15  9:16 AM  Result Value Ref Range Status   Specimen Description FLUID PLEURAL RIGHT  Final   Special Requests NONE  Final   Gram Stain   Final  FEW WBC PRESENT,BOTH PMN AND MONONUCLEAR NO ORGANISMS SEEN    Report Status 11/22/2015 FINAL  Final     Labs: Basic Metabolic Panel:  Recent Labs Lab 11/18/15 2201  11/19/15 2130 11/20/15 0823 11/21/15 1409 11/23/15 0532  11/24/15 0808  NA 137  < > 137 136 135 131* 133*  K 4.0  < > 4.1 4.3 3.9 4.3 3.6  CL 99*  < > 98* 95* 97* 94* 95*  CO2 27  < > 27 27 26 25 26   GLUCOSE 114*  < > 111* 90 94 93 118*  BUN 28*  < > 40* 45* 39* 43* 31*  CREATININE 4.91*  < > 6.26* 6.47* 6.37* 6.30* 5.63*  CALCIUM 8.9  < > 9.1 9.3 9.2 9.3 9.2  PHOS 3.3  --  4.1 4.7* 3.5  --  4.2  < > = values in this interval not displayed. Liver Function Tests:  Recent Labs Lab 11/18/15 2201 11/19/15 2130 11/20/15 0823 11/21/15 1409 11/24/15 0808  ALBUMIN 3.4* 3.2* 3.4* 3.1* 3.4*   No results for input(s): LIPASE, AMYLASE in the last 168 hours. No results for input(s): AMMONIA in the last 168 hours. CBC:  Recent Labs Lab 11/19/15 2130 11/20/15 0823 11/21/15 1409 11/22/15 0725 11/23/15 0532 11/24/15 0808  WBC 7.3 7.0 7.6  --  6.9 9.5  HGB 7.3* 7.3* 7.0* 9.1* 9.0* 8.7*  HCT 23.9* 23.6* 23.4* 28.8* 28.8* 28.2*  MCV 89.2 89.1 86.7  --  86.5 86.8  PLT 90* 100* 102*  --  124* 114*   Cardiac Enzymes: No results for input(s): CKTOTAL, CKMB, CKMBINDEX, TROPONINI in the last 168 hours. BNP: BNP (last 3 results)  Recent Labs  08/16/15 2350 08/17/15 1535 11/10/15 1550  BNP 174.2* 204.1* 284.6*    ProBNP (last 3 results) No results for input(s): PROBNP in the last 8760 hours.  CBG: No results for input(s): GLUCAP in the last 168 hours.

## 2015-11-25 NOTE — Clinical Social Work Note (Signed)
Clinical Social Work Assessment  Patient Details  Name: Bruce Cole MRN: [id number] Date of Birth: 09/24/1940  Date of referral:  11/25/15               Reason for consult:  Facility Placement, Discharge Planning                Permission sought to share information with:  Facility Sport and exercise psychologist, Family Supports Permission granted to share information::  Yes, Verbal Permission Granted  Name::     [name]  Agency::  SNFs  Relationship::     Contact Information:     Housing/Transportation Living arrangements for the past 2 months:  Single Family Home Source of Information:  Patient Patient Interpreter Needed:  None Criminal Activity/Legal Involvement Pertinent to Current Situation/Hospitalization:  No - Comment as needed Significant Relationships:  Adult Children Lives with:  Self Do you feel safe going back to the place where you live?  Yes Need for family participation in patient care:  Yes (Comment)  Care giving concerns:  The patient states that he feels he would benefit from short term SNF at discharge. Prefers [facility] or [facility].   Social Worker assessment / plan:  CSW met with patient at bedside to complete assessment. CSW explained SNF recommendation and the patient is agreeable. He was hopefull for inpatient rehab but understands that the program does not currently have a bed available. He states that he would prefer [facility] or [facility] at time of discharge. CSW explained SNF search/placement process and answered the patient's questions. CSW explained that per MD the patient is ready for DC today. The patient is agreeable. CSW will assist with DC.  Employment status:  Disabled (Comment on whether or not currently receiving Disability), Retired Forensic scientist:  Medicare PT Recommendations:  [place] / Referral to community resources:  [place]  Patient/Family's Response to care:  The patient is happy  with the care he has received and appreciative of CSW assistance.  Patient/Family's Understanding of and Emotional Response to Diagnosis, Current Treatment, and Prognosis:  The patient appears to have a good understanding of the reason for his admission and his post DC needs.   Emotional Assessment Appearance:  Appears stated age Attitude/Demeanor/Rapport:  Other (Patient is appropriate and welcoming of CSW.) Affect (typically observed):  Accepting, Appropriate, Calm, Pleasant Orientation:  Oriented to Self, Oriented to Place, Oriented to  Time, Oriented to Situation Alcohol / Substance use:  Not Applicable Psych involvement (Current and /or in the community):  No (Comment)  Discharge Needs  Concerns to be addressed:  Discharge Planning Concerns Readmission within the last 30 days:  No Current discharge risk:  Chronically ill, Physical Impairment Barriers to Discharge:  Continued Medical Work up   Fredderick Phenix B, LCSW 11/25/2015, 10:05 AM

## 2015-11-25 NOTE — Telephone Encounter (Signed)
Rx denied-Called and spoke with the pt and he stated that he is in the hospital and he has been taking off the Lasix.//AB/CMA

## 2015-11-25 NOTE — Care Management Note (Signed)
Case Management Note  Patient Details  Name: Bruce Cole MRN: [name] Date of Birth: 11-May-1941  Subjective/Objective:   Symptomatic anemia, ESRD                 Action/Plan: Discharge Planning: AVS reviewed:  Chart reviewed. CSW following for SNF placement. Scheduled dc to SNF today.   Expected Discharge Date:  11/25/2015              Expected Discharge Plan:  Home/Self Care  In-House Referral:  NA  Discharge planning Services  CM Consult  Post Acute Care Choice:  NA Choice offered to:  NA  DME Arranged:  N/A DME Agency:  NA  HH Arranged:  NA HH Agency:  NA  Status of Service:  Completed, signed off  Medicare Important Message Given:  Yes Date Medicare IM Given:    Medicare IM give by:    Date Additional Medicare IM Given:    Additional Medicare Important Message give by:     If discussed at [place] of Stay Meetings, dates discussed:    Additional Comments:  Erenest Rasher, RN 11/25/2015, 11:02 AM

## 2015-11-25 NOTE — Progress Notes (Signed)
ANTICOAGULATION CONSULT NOTE - Follow Up Consult  Pharmacy Consult for Coumadin Indication: atrial fibrillation  Allergies  Allergen Reactions  . Ace Inhibitors Other (See Comments)    Worsening renal insufficiency    Patient Measurements: Height: 6\' 3"  (190.5 cm) Weight: 228 lb 11.2 oz (103.738 kg) IBW/kg (Calculated) : 84.5  Vital Signs: Temp: 98.1 F (36.7 C) (05/10 0500) Temp Source: Oral (05/10 0500) BP: 94/55 mmHg (05/10 0500) Pulse Rate: 58 (05/10 0500)  Labs:  Recent Labs  11/23/15 0532 11/24/15 0533 11/24/15 0808 11/25/15 0535  HGB 9.0*  --  8.7*  --   HCT 28.8*  --  28.2*  --   PLT 124*  --  114*  --   LABPROT 15.3* 16.0*  --  17.0*  INR 1.19 1.27  --  1.37  CREATININE 6.30*  --  5.63*  --     Estimated Creatinine Clearance: 15.5 mL/min (by C-G formula based on Cr of 5.63).  Assessment: 74yom on coumadin pta for afib. INR on admit 4/25 was 5.58 and coumadin placed on hold for possible GIB. Underwent endoscopy 4/28 which showed mild gastritis but otherwise normal. Coumadin remained on hold for left arm fistulogram 5/4 and left IJ HD cath/AV fistula 5/8. Coumadin resumed 5/8. INR below goal at 1.37 but trending up.  Home dose: 7.5mg  daily  Goal of Therapy:  INR 2-3 Monitor platelets by anticoagulation protocol: Yes   Plan:  1) Coumadin 7.5mg  x 1 2) Daily INR  Deboraha Sprang 11/25/2015,9:38 AM

## 2015-11-25 NOTE — Telephone Encounter (Signed)
Please call pt to arrange TCM follow up.

## 2015-11-25 NOTE — Care Management Important Message (Signed)
Important Message  Patient Details  Name: Bruce Cole MRN: [id number] Date of Birth: 1941/03/11   Medicare Important Message Given:  Yes    [name] May 11/25/2015, 1:18 PM

## 2015-11-25 NOTE — Clinical Social Work Placement (Signed)
   CLINICAL SOCIAL WORK PLACEMENT  NOTE  Date:  11/25/2015  Patient Details  Name: Bruce Cole MRN: [id number] Date of Birth: Mar 05, 1941  Clinical Social Work is seeking post-discharge placement for this patient at the [place] level of care (*CSW will initial, date and re-position this form in  chart as items are completed):  Yes   Patient/family provided with [facility] Work Department's list of facilities offering this level of care within the geographic area requested by the patient (or if unable, by the patient's family).  Yes   Patient/family informed of their freedom to choose among providers that offer the needed level of care, that participate in Medicare, Medicaid or managed care program needed by the patient, have an available bed and are willing to accept the patient.  Yes   Patient/family informed of [facility]'s ownership interest in [facility] and [facility], as well as of the fact that they are under no obligation to receive care at these facilities.  PASRR submitted to EDS on       PASRR number received on       Existing PASRR number confirmed on 11/25/15     FL2 transmitted to all facilities in geographic area requested by pt/family on 11/25/15     FL2 transmitted to all facilities within larger geographic area on       Patient informed that his/her managed care company has contracts with or will negotiate with certain facilities, including the following:            Patient/family informed of bed offers received.  Patient chooses bed at       Physician recommends and patient chooses bed at      Patient to be transferred to   on  .  Patient to be transferred to facility by       Patient family notified on   of transfer.  Name of family member notified:        PHYSICIAN Please prepare priority discharge summary, including medications, Please prepare prescriptions, Please sign FL2     Additional Comment:     _______________________________________________ Rigoberto Noel, LCSW 11/25/2015, 10:11 AM

## 2015-11-25 NOTE — Progress Notes (Signed)
[facility]  Subjective  - Doing well, pain at incisions controlled with medication.   Objective 94/55 58 98.1 F (36.7 C) (Oral) 18 96%  Intake/Output Summary (Last 24 hours) at 11/25/15 0756 Last data filed at 11/24/15 2037  Gross per 24 hour  Intake    450 ml  Output   2757 ml  Net  -2307 ml    Left upper arm without hematoma Grip 5/5, palpable radial pulse, palpable thrill in fistula   Assessment/Planning: POD # 64 75 y.o. year old male who is s/p #1 left IJ hemodialysis catheter with SonoSite visualization, #2 left basilic vein transposition fistula. -f/u with Dr. Donnetta Hutching in 4-6 weeks to check maturation of AVF   Laurence Slate [facility] 11/25/2015 7:56 AM --  Laboratory Lab Results:  Recent Labs  11/23/15 0532 11/24/15 0808  WBC 6.9 9.5  HGB 9.0* 8.7*  HCT 28.8* 28.2*  PLT 124* 114*   BMET  Recent Labs  11/23/15 0532 11/24/15 0808  NA 131* 133*  K 4.3 3.6  CL 94* 95*  CO2 25 26  GLUCOSE 93 118*  BUN 43* 31*  CREATININE 6.30* 5.63*  CALCIUM 9.3 9.2    COAG Lab Results  Component Value Date   INR 1.37 11/25/2015   INR 1.27 11/24/2015   INR 1.19 11/23/2015   No results found for: PTT

## 2015-11-25 NOTE — Clinical Social Work Note (Deleted)
Clinical Social Work Assessment  Patient Details  Name: Bruce Cole MRN: [id number] Date of Birth: 1940/09/23  Date of referral:  11/25/15               Reason for consult:  Facility Placement, Discharge Planning                Permission sought to share information with:  Facility Sport and exercise psychologist, Family Supports Permission granted to share information::  Yes, Verbal Permission Granted  Name::     [name]  Agency::  SNFs  Relationship::     Contact Information:     Housing/Transportation Living arrangements for the past 2 months:  Single Family Home Source of Information:  Patient Patient Interpreter Needed:  None Criminal Activity/Legal Involvement Pertinent to Current Situation/Hospitalization:  No - Comment as needed Significant Relationships:  Adult Children Lives with:  Self Do you feel safe going back to the place where you live?  Yes Need for family participation in patient care:  Yes (Comment)  Care giving concerns:  The patient states that he agrees with recommendation for SNF placement.   Social Worker assessment / plan:  CSW met with the patient at bedside to complete assessment. The patient states that he lives at home alone. CSW tested patient's orientation. He is able to state that he is at [facility], he states the year is "86", and he states that our president is Trump. The patient states that he is open to SNF placement at discharge. He reports that is supports are very limited and that Clair Gulling (friend) is the main person that helps him with things. The patient states that he is agreeable to Clair Gulling being involved in the discharge planning process. CSW explained the SNF search/placement process and answered the patient's questions.   CSW spoke with the patient's [facility] social worker Eula Fried. Jerene Pitch has been assisting the patient in the community but has faced many obstacles in getting the patient services that he needs. She shares that the patient's  daughter is in [place] and is not really involved with the patient. She does report that an APS report has been made but the results of the case have not been received. If we are not able to get a response from APS before DC we will need to make the receiving facility aware of the open case. CSW will followup with bed offers.   Employment status:  Disabled (Comment on whether or not currently receiving Disability), Retired Forensic scientist:  Medicare PT Recommendations:  [place] / Referral to community resources:  [place]  Patient/Family's Response to care:  The patient appears happy with the care he is receiving.  Patient/Family's Understanding of and Emotional Response to Diagnosis, Current Treatment, and Prognosis:  The patient appears to have limited understanding of reason for hospitalization but seems agreeable to SNF placement at discharge.   Emotional Assessment Appearance:  Appears stated age Attitude/Demeanor/Rapport:  Other (Patient is appropriate and welcoming of CSW.) Affect (typically observed):  Accepting, Appropriate, Calm, Pleasant Orientation:  Oriented to Self, Oriented to Place, Oriented to  Time, Oriented to Situation Alcohol / Substance use:  Not Applicable Psych involvement (Current and /or in the community):  No (Comment)  Discharge Needs  Concerns to be addressed:  Discharge Planning Concerns Readmission within the last 30 days:  No Current discharge risk:  Chronically ill, Physical Impairment Barriers to Discharge:  Continued Medical Work up   Rigoberto Noel, LCSW 11/25/2015, 12:32 PM

## 2015-11-25 NOTE — NC FL2 (Signed)
[place] LEVEL OF CARE SCREENING TOOL     IDENTIFICATION  Patient Name: Bruce Cole Birthdate: 05/19/1941 Sex: male Admission Date (Current Location): 11/10/2015  [facility] [place] Number:  Herbalist and Address:  [facility], [place] [address] [place], [place] [address]      Provider Number: [id number]  Attending Physician Name and Address:  Robbie Lis, MD  Relative Name and Phone Number:       Current Level of Care: Hospital Recommended Level of Care: [place] Prior Approval Number:    Date Approved/Denied:   PASRR Number: [id number]  Discharge Plan: SNF    Current Diagnoses: Patient Active Problem List   Diagnosis Date Noted  . Tachypnea   . Acute blood loss anemia   . Primary osteoarthritis of right hip   . Thrombocytopenia ([place])   . S/P dialysis catheter insertion ([place])   . Gastritis and gastroduodenitis   . Pleural effusion   . Heme positive stool   . Coagulopathy ([place])   . Cardiorenal disease 11/10/2015  . Symptomatic anemia 11/10/2015  . GI bleed 11/10/2015  . Supratherapeutic INR 11/10/2015  . Renal failure   . Acute on chronic congestive heart failure ([place])   . AKI (acute kidney injury) ([place])   . Chronic atrial fibrillation ([place])   . Bradycardia 08/21/2015  . S/P thoracentesis   . Pleural effusion on right   . Acute renal failure superimposed on stage 3 chronic kidney disease ([place]) 08/18/2015  . CHF exacerbation ([place]) 08/17/2015  . SOB (shortness of breath)   . Acute on chronic combined systolic (congestive) and diastolic (congestive) heart failure ([place])   . Acute on chronic diastolic heart failure ([place])   . Congestive heart failure, unspecified   . Chronic diastolic heart failure ([place]) 01/14/2015  . Cerumen impaction 09/15/2014  . Encounter for therapeutic drug monitoring 09/02/2013  . OSA (obstructive sleep apnea) 09/02/2013  . Chronic combined systolic and diastolic  heart failure ([place]) 03/16/2013  . Other chronic pulmonary heart diseases 03/16/2013  . CKD (chronic kidney disease), stage IV ([place]) 02/05/2013  . Allergic rhinitis 12/18/2012  . Long term (current) use of anticoagulants 11/25/2010  . Genital herpes 05/31/2010  . UNSPECIFIED ANEMIA 11/23/2009  . Diabetes mellitus type 2, controlled, with complications ([place]) 0000000  . ERECTILE DYSFUNCTION, ORGANIC 09/21/2009  . DEGENERATIVE JOINT DISEASE, ADVANCED 09/21/2009  . PERSONAL HX COLONIC POLYPS 08/25/2009  . Gout 07/22/2009  . Essential hypertension 07/22/2009  . ATRIAL FIBRILLATION 07/22/2009  . Rheumatoid arthritis ([place]) 07/22/2009  . NEPHROLITHIASIS, HX OF 07/22/2009    Orientation RESPIRATION BLADDER Height & Weight     Self, Time, Situation, Place  Normal Continent Weight: 103.738 kg (228 lb 11.2 oz) Height:  6\' 3"  (190.5 cm)  BEHAVIORAL SYMPTOMS/MOOD NEUROLOGICAL BOWEL NUTRITION STATUS   (NONE)  (NONE) Continent Diet (Heart Healthy)  AMBULATORY STATUS COMMUNICATION OF NEEDS Skin   Extensive Assist Verbally Surgical wounds (Incisions neck left and neck right, arm left.)                       Personal Care Assistance Level of Assistance  Bathing, Feeding, Dressing Bathing Assistance: Limited assistance Feeding assistance: Independent Dressing Assistance: Limited assistance     Functional Limitations Info  Sight, Hearing, Speech Sight Info: Adequate Hearing Info: Adequate Speech Info: Adequate    SPECIAL CARE FACTORS FREQUENCY  PT (By licensed PT), OT (By licensed OT)  PT Frequency: 5/week OT Frequency: 5/week            Contractures Contractures Info: Not present    Additional Factors Info  Code Status, Allergies Code Status Info: Full Allergies Info: Ace inhibitors  THE PATIENT WILL GO TO [facility] FOR DIALYSIS TTS.           Current Medications (11/25/2015):  This is the current hospital active medication list Current  Facility-Administered Medications  Medication Dose Route Frequency Provider Last Rate Last Dose  . acetaminophen (TYLENOL) tablet 650 mg  650 mg Oral Q4H PRN Ilene Qua Opyd, MD      . albuterol (PROVENTIL) (2.5 MG/3ML) 0.083% nebulizer solution 2.5 mg  2.5 mg Nebulization Q4H PRN Vianne Bulls, MD   2.5 mg at 11/12/15 2123  . allopurinol (ZYLOPRIM) tablet 200 mg  200 mg Oral Daily Vianne Bulls, MD   200 mg at 11/25/15 0818  . carvedilol (COREG) tablet 3.125 mg  3.125 mg Oral Daily Elwin Mocha, MD   3.125 mg at 11/25/15 0818  . Darbepoetin Alfa (ARANESP) injection 100 mcg  100 mcg Subcutaneous Q Wed-1800 Donato Heinz, MD   100 mcg at 11/18/15 1649  . fentaNYL (SUBLIMAZE) injection 25-50 mcg  25-50 mcg Intravenous Q5 min PRN Catalina Gravel, MD      . ferric gluconate (NULECIT) 125 mg in sodium chloride 0.9 % 100 mL IVPB  125 mg Intravenous Q T,Th,Sa-HD Jamal Maes, MD   125 mg at 11/24/15 1141  . fluticasone (FLONASE) 50 MCG/ACT nasal spray 2 spray  2 spray Each Nare Daily Vianne Bulls, MD   2 spray at 11/22/15 1053  . heparin injection 3,000 Units  3,000 Units Dialysis PRN Elmarie Shiley, MD      . HYDROcodone-acetaminophen (NORCO/VICODIN) 5-325 MG per tablet 1-2 tablet  1-2 tablet Oral Q4H PRN Theodis Blaze, MD   1 tablet at 11/25/15 [phone]  . hydroxychloroquine (PLAQUENIL) tablet 200 mg  200 mg Oral BID Vianne Bulls, MD   200 mg at 11/25/15 0818  . multivitamin (RENA-VIT) tablet 1 tablet  1 tablet Oral QHS Estanislado Emms, MD   1 tablet at 11/24/15 2121  . ondansetron (ZOFRAN) injection 4 mg  4 mg Intravenous Q6H PRN Vianne Bulls, MD   4 mg at 11/15/15 1753  . ondansetron (ZOFRAN) injection 4 mg  4 mg Intravenous Once PRN Catalina Gravel, MD      . pantoprazole (PROTONIX) EC tablet 40 mg  40 mg Oral BID Vena Rua, PA-C   40 mg at 11/25/15 0818  . promethazine (PHENERGAN) tablet 25 mg  25 mg Oral Q6H PRN Theodis Blaze, MD   25 mg at 11/14/15 2221  . senna (SENOKOT)  tablet 8.6 mg  1 tablet Oral Daily PRN Ritta Slot, NP   8.6 mg at 11/23/15 2126  . sevelamer carbonate (RENVELA) tablet 800 mg  800 mg Oral TID WC Elberta Leatherwood, MD   800 mg at 11/25/15 0818  . warfarin (COUMADIN) tablet 7.5 mg  7.5 mg Oral ONCE-1800 Otilio Miu, [facility]      . Warfarin - Pharmacist Dosing Inpatient   Does not apply q1800 Otilio Miu, RPH      . zolpidem (AMBIEN) tablet 5 mg  5 mg Oral QHS PRN Fransico Meadow, PA-C   5 mg at 11/17/15 2201     Discharge Medications: Please see discharge summary for a list  of discharge medications.  Relevant Imaging Results:  Relevant Lab Results:   Additional Information SSN: [id number].   Rigoberto Noel, LCSW

## 2015-11-25 NOTE — Telephone Encounter (Signed)
Unable to complete TCM at this time as pt discharged to SNF.

## 2015-11-25 NOTE — Progress Notes (Signed)
Name: Bruce Cole MRN: [name] DOB: 1941/05/30    ADMISSION DATE:  11/10/2015 CONSULTATION DATE:  4/28  REFERRING MD :  [facility]  CHIEF COMPLAINT:  Rt pleural effusion   HISTORY OF PRESENT ILLNESS:  75 y/o male with stage 4 kidney disease with left AVF placed 2/17 who presents with increased SOB/Lethargy/riding creatine/volume overloaded. He is followed by heart failure clinic and has documented OSA (which he denies). Long standing Afib for which he is on coumadin (inr 3.88). He has a recurrent right pleural effusion that was tapped in 2/17 for 1500 cc with negative cytology. PCCM asked to place HD cath (done 4/27) and now consult for right effusion. Studies negative thus far.  Pt significantly volume improved.    SUBJECTIVE:  Pt denies acute c/o's.  Reports feeling much better.    VITAL SIGNS: Temp:  [97.1 F (36.2 C)-98.8 F (37.1 C)] 98.1 F (36.7 C) (05/10 0500) Pulse Rate:  [52-58] 58 (05/10 0500) Resp:  [18-28] 18 (05/10 0500) BP: (91-104)/(47-60) 94/55 mmHg (05/10 0500) SpO2:  [96 %-100 %] 96 % (05/10 0500) Weight:  [226 lb 6.6 oz (102.7 kg)-228 lb 11.2 oz (103.738 kg)] 228 lb 11.2 oz (103.738 kg) (05/10 0500)  PHYSICAL EXAMINATION: General:  Well developed adult male in NAD Neuro:  AAOx4, speech clear, MAE HEENT:  MM pink/moist, no jvd Cardiovascular: HSIR IR Lungs:  Even/non-labored, diminished on R with few crackles, left clear Abdomen:  obsese + bs Musculoskeletal: intact , old rt knee median scar Skin:  Warm/dry, BLE 1+ edema  Recent Labs Lab 11/21/15 1409 11/23/15 0532 11/24/15 0808  NA 135 131* 133*  K 3.9 4.3 3.6  CL 97* 94* 95*  CO2 26 25 26   BUN 39* 43* 31*  CREATININE 6.37* 6.30* 5.63*  GLUCOSE 94 93 118*    Recent Labs Lab 11/21/15 1409 11/22/15 0725 11/23/15 0532 11/24/15 0808  HGB 7.0* 9.1* 9.0* 8.7*  HCT 23.4* 28.8* 28.8* 28.2*  WBC 7.6  --  6.9 9.5  PLT 102*  --  124* 114*    SIGNIFICANT EVENTS: 4/25  Admit with SOB,  malaise 4/27  HD cath placed per [facility] 5/07  IR thoracentesis for effusion, 1.4L out.  Incomplete pleural studies, suspect transudate, cultures neg thus far 5/08  AVF placement  STUDIES:  5/07  Thoracentesis >> 1.4L removed, yellow fluid, LD 151, Protein 3.8, WBC 130, Lymph 22, Eos 0, Neut 19.  Ph 7.9, glucose 99, cytology negative  CULTURES: Pleural 5/7 >>    ASSESSMENT:  Principal Problem:    Pleural effusion, right , recurrent in setting of volume overload   Symptomatic anemia   Essential hypertension   Rheumatoid arthritis ([place])   Diabetes mellitus type 2, controlled, with complications ([place])   CKD (chronic kidney disease), stage IV (HCC)   Chronic combined systolic and diastolic heart failure (HCC)   Acute renal failure superimposed on stage 3 chronic kidney disease   Chronic atrial fibrillation (HCC)   AKI (acute kidney injury) ([place])   Cardiorenal disease   GI bleed   Supratherapeutic INR   Heme positive stool   Coagulopathy (HCC)   Gastritis and gastroduodenitis    Discussion: 75 yo MO(272 lbs) with stage 4 kidney disease with left AVF placed 2/17 who presents with increased SOB/Lethargy/rising creatine/volume overloaded. He is followed by heart failure clinic and has documented OSA (which he denies). Long standing Afib for which he is on coumadin (inr 3.88). He has a recurrent right pleural effusion that was  tapped in 2/17 for 1500 cc with negative cytology. PCCM asked to place HD cath (done 4/27) and later consulted for evaluation of right effusion. Suspect this will improve once negative fluid balance is obtained with new HD catheter. Renal and and cards are following. We would need to correct inr to perform thoracentesis.    Right Pleural Effusion - suspect transudative process, incomplete studies obtained, near complete clearance post thora with some residual accumulation noted on 5/8 imaging.  Culture negative to date as well as cytology.   -Follow cultures,  ngtd  -negative balance as able  -intermittent imaging to assess for early complicated pleural fluid / loculation etc  DOE: due to pulmonary edema.  - HD with negative balance  - Limit fluid intake.  OSA: suspected.  - F/U as outpatient upon discharge for a sleep study.  Hypoxemia:  - Titrate O2 for sat of 88-92%.  - Ambulatory desaturation study to evaluate for home O2.   PCCM will be available PRN.  Please call if new needs arise.    Noe Gens, NP-C [facility] Pgr: [phone] or if no answer [phone] 11/25/2015, 9:20 AM

## 2015-11-25 NOTE — Clinical Social Work Placement (Signed)
   CLINICAL SOCIAL WORK PLACEMENT  NOTE  Date:  11/25/2015  Patient Details  Name: Bruce Cole MRN: [name] Date of Birth: March 26, 1941  Clinical Social Work is seeking post-discharge placement for this patient at the [place] level of care (*CSW will initial, date and re-position this form in  chart as items are completed):  Yes   Patient/family provided with [place] Work Department's list of facilities offering this level of care within the geographic area requested by the patient (or if unable, by the patient's family).  Yes   Patient/family informed of their freedom to choose among providers that offer the needed level of care, that participate in Medicare, Medicaid or managed care program needed by the patient, have an available bed and are willing to accept the patient.  Yes   Patient/family informed of [facility]'s ownership interest in [facility] and [facility], as well as of the fact that they are under no obligation to receive care at these facilities.  PASRR submitted to EDS on       PASRR number received on       Existing PASRR number confirmed on 11/25/15     FL2 transmitted to all facilities in geographic area requested by pt/family on 11/25/15     FL2 transmitted to all facilities within larger geographic area on       Patient informed that his/her managed care company has contracts with or will negotiate with certain facilities, including the following:        Yes   Patient/family informed of bed offers received.  Patient chooses bed at [facility]     Physician recommends and patient chooses bed at      Patient to be transferred to [facility] on 11/25/15.  Patient to be transferred to facility by Ambulance     Patient family notified on 11/25/15 of transfer.  Name of family member notified:  Patient will notify family.     PHYSICIAN Please prepare priority discharge summary, including medications,  Please prepare prescriptions, Please sign FL2     Additional Comment:  Per MD patient ready for DC to [facility]. RN, patient, patient's family, and facility notified of DC. RN given number for report. DC packet on chart. Ambulance transport requested for patient for 1:15PM pickup. CSW signing off.   _______________________________________________ Liz Beach MSW, [place], [place], [id number]

## 2015-11-25 NOTE — Consult Note (Signed)
   [facility] CM Inpatient Consult   11/25/2015  Bruce Cole Oct 02, 1940 [id number]   Follow up with post hospital plan/disposition.   Patient was active [up to admission]  with [facility] Management.  Met with patient at bedside regarding being restarted with [facility] services for post hospital monitoring, education and resources. Patient states he will be going to rehab short term and as he lives alone and is not able to care for himself, yet. Consent form is on file. Patient states he would like the on-going follow up to assist him with his return home.   Of note, [facility] Care Management services does not replace or interfere with any services that are arranged by inpatient case management or social work. Will update inpatient RNCM about [facility] involvement. For additional questions or referrals please contact:  Natividad Brood, RN BSN [facility] Liaison  [phone] business mobile phone Toll free office [phone]

## 2015-11-25 NOTE — Discharge Instructions (Signed)
11/23/2015 TYLEIK LIVERS [name] 03/08/1941  Surgeon(s): Rosetta Posner, MD  Procedure(s): ARTERIOVENOUS (AV) FISTULA CREATION INSERTION OF DIALYSIS CATHETER  x Do not stick fistula for 12 weeks     Information on my medicine - Coumadin   (Warfarin)  This medication education was reviewed with me or my healthcare representative as part of my discharge preparation.  The pharmacist that spoke with me during my hospital stay was:  Deboraha Sprang, [facility]  Why was Coumadin prescribed for you? Coumadin was prescribed for you because you have a blood clot or a medical condition that can cause an increased risk of forming blood clots. Blood clots can cause serious health problems by blocking the flow of blood to the heart, lung, or brain. Coumadin can prevent harmful blood clots from forming. As a reminder your indication for Coumadin is:   Stroke Prevention Because Of Atrial Fibrillation  What test will check on my response to Coumadin? While on Coumadin (warfarin) you will need to have an INR test regularly to ensure that your dose is keeping you in the desired range. The INR (international normalized ratio) number is calculated from the result of the laboratory test called prothrombin time (PT).  If an INR APPOINTMENT HAS NOT ALREADY BEEN MADE FOR YOU please schedule an appointment to have this lab work done by your health care provider within 7 days. Your INR goal is usually a number between:  2 to 3 or your provider may give you a more narrow range like 2-2.5.  Ask your health care provider during an office visit what your goal INR is.  What  do you need to  know  About  COUMADIN? Take Coumadin (warfarin) exactly as prescribed by your healthcare provider about the same time each day.  DO NOT stop taking without talking to the doctor who prescribed the medication.  Stopping without other blood clot prevention medication to take the place of Coumadin may increase your risk of  developing a new clot or stroke.  Get refills before you run out.  What do you do if you miss a dose? If you miss a dose, take it as soon as you remember on the same day then continue your regularly scheduled regimen the next day.  Do not take two doses of Coumadin at the same time.  Important Safety Information A possible side effect of Coumadin (Warfarin) is an increased risk of bleeding. You should call your healthcare provider right away if you experience any of the following: ? Bleeding from an injury or your nose that does not stop. ? Unusual colored urine (red or dark brown) or unusual colored stools (red or black). ? Unusual bruising for unknown reasons. ? A serious fall or if you hit your head (even if there is no bleeding).  Some foods or medicines interact with Coumadin (warfarin) and might alter your response to warfarin. To help avoid this: ? Eat a balanced diet, maintaining a consistent amount of Vitamin K. ? Notify your provider about major diet changes you plan to make. ? Avoid alcohol or limit your intake to 1 drink for women and 2 drinks for men per day. (1 drink is 5 oz. wine, 12 oz. beer, or 1.5 oz. liquor.)  Make sure that ANY health care provider who prescribes medication for you knows that you are taking Coumadin (warfarin).  Also make sure the healthcare provider who is monitoring your Coumadin knows when you have started a new medication including  herbals and non-prescription products.  Coumadin (Warfarin)  Major Drug Interactions  Increased Warfarin Effect Decreased Warfarin Effect  Alcohol (large quantities) Antibiotics (esp. Septra/Bactrim, Flagyl, Cipro) Amiodarone (Cordarone) Aspirin (ASA) Cimetidine (Tagamet) Megestrol (Megace) NSAIDs (ibuprofen, naproxen, etc.) Piroxicam (Feldene) Propafenone (Rythmol SR) Propranolol (Inderal) Isoniazid (INH) Posaconazole (Noxafil) Barbiturates (Phenobarbital) Carbamazepine (Tegretol) Chlordiazepoxide  (Librium) Cholestyramine (Questran) Griseofulvin Oral Contraceptives Rifampin Sucralfate (Carafate) Vitamin K   Coumadin (Warfarin) Major Herbal Interactions  Increased Warfarin Effect Decreased Warfarin Effect  Garlic Ginseng Ginkgo biloba Coenzyme Q10 Green tea St. Johns wort    Coumadin (Warfarin) FOOD Interactions  Eat a consistent number of servings per week of foods HIGH in Vitamin K (1 serving =  cup)  Collards (cooked, or boiled & drained) Kale (cooked, or boiled & drained) Mustard greens (cooked, or boiled & drained) Parsley *serving size only =  cup Spinach (cooked, or boiled & drained) Swiss chard (cooked, or boiled & drained) Turnip greens (cooked, or boiled & drained)  Eat a consistent number of servings per week of foods MEDIUM-HIGH in Vitamin K (1 serving = 1 cup)  Asparagus (cooked, or boiled & drained) Broccoli (cooked, boiled & drained, or raw & chopped) Brussel sprouts (cooked, or boiled & drained) *serving size only =  cup Lettuce, raw (green leaf, endive, romaine) Spinach, raw Turnip greens, raw & chopped   These websites have more information on Coumadin (warfarin):  FailFactory.se; VeganReport.com.au;

## 2015-11-25 NOTE — Progress Notes (Signed)
[facility] Rounding Note Subjective/Interval History:  Had dialysis yesterday no issues Plan is for discharge to [facility] today [facility]  Pt in good spirits No SOB Not dizzy despite low BP 90's  Objective: Vital signs in last 24 hours: Temp:  [98.1 F (36.7 C)-98.8 F (37.1 C)] 98.1 F (36.7 C) (05/10 0500) Pulse Rate:  [58] 58 (05/10 0500) Resp:  [18] 18 (05/10 0500) BP: (91-94)/(55-60) 94/55 mmHg (05/10 0500) SpO2:  [96 %-99 %] 96 % (05/10 0500) Weight:  [103.738 kg (228 lb 11.2 oz)] 103.738 kg (228 lb 11.2 oz) (05/10 0500) Weight change: -2.1 kg (-4 lb 10.1 oz)  Intake/Output from previous day: 05/09 0701 - 05/10 0700 In: 330 [P.O.:220; IV Piggyback:110] Out: 2757  Intake/Output this shift: Total I/O In: 120 [P.O.:120] Out: -   Physical Examination Awake, alert, no complaints Talking to [facility] staff Lungs anteriorly clear Bradycardic 50's S1S2 No S3 Abd soft and not tender Marked improvement in LE edema - now just some pedal edema L AVF with + bruit, hand warm, no steal. Wound looks fine Left IJ TDC dressing OK  Some bruising ant chest wall from previous R sided temp cath   Lab Results:  Recent Labs  11/23/15 0532 11/24/15 0808  WBC 6.9 9.5  HGB 9.0* 8.7*  HCT 28.8* 28.2*  PLT 124* 114*   BMET:   Recent Labs  11/23/15 0532 11/24/15 0808  NA 131* 133*  K 4.3 3.6  CL 94* 95*  CO2 25 26  GLUCOSE 93 118*  BUN 43* 31*  CREATININE 6.30* 5.63*  CALCIUM 9.3 9.2   Lab Results  Component Value Date   INR 1.37 11/25/2015   INR 1.27 11/24/2015   INR 1.19 11/23/2015    Studies/Results: No results found.  Scheduled: . allopurinol  200 mg Oral Daily  . carvedilol  3.125 mg Oral Daily  . darbepoetin (ARANESP) injection - NON-DIALYSIS  100 mcg Subcutaneous Q Wed-1800  . ferric gluconate (FERRLECIT/NULECIT) IV  125 mg Intravenous Q T,Th,Sa-HD  . fluticasone  2 spray Each Nare Daily  . hydroxychloroquine  200 mg Oral BID  .  multivitamin  1 tablet Oral QHS  . pantoprazole  40 mg Oral BID  . sevelamer carbonate  800 mg Oral TID WC  . warfarin  7.5 mg Oral ONCE-1800  . Warfarin - Pharmacist Dosing Inpatient   Does not apply q1800   Assessment/Plan:  1. ESRD, new start- 1st HD 4/27. Clipped to TTS [employer] second shift. HD today to get on outpt schedule. With HD yesterday Pre weight 104.8 kg (down from max of 125 kg) Post weight 102.7 - will make EDW 102 for now 2. S/p left upper arm AVF and TDC placement (5/8) - catheter working well, no steal, + bruit in AVF  3. S/p ABLA- due to gastritis in setting of anticoagulation. Transfused PRBC's  (2 units 4/26, 2 units 5/7) 4. Anemia of ESRD (+ABLA) tsat on 11/12/15 was 4%. Load with HD X 1 gram total started - got 1st dose yesterday - change to venofer for outpt and give 9 more doses. Darbe 100 QWeek did not get dose here as ordered. Will need to start Mircera outpt. 5. CKD-MBD - On binders. Phos looks fine. PTH 104 - no VDRA needed 6. CHF - improved with volume removal 7. R pleural effusion - 1.5 liters off 4/7 thoracentesis 8. A fib on coumadin -  Coumadin was on hold for GIB, has been  resumed. INR not yet therapeutic.SNF will dose - when he leaves the SNF he will need to be hooked up with coumadin clinic 9. Bradycardia - remains on small dose coreg  10. Chronic systolic CHF - EF [id number]. CHF sx much improved w/vol removal with HD and thoracentesis 11. Right Pleural effusion s/p Thoracentesis 1.5L on 5/7 - symptomaticcally better 12. RA on plaquenil 13. Disposition - lives alone. Going to [facility] for [place], [facility] [phone] Pager 11/25/2015, 12:47 PM

## 2015-11-26 ENCOUNTER — Other Ambulatory Visit: Payer: Self-pay | Admitting: *Deleted

## 2015-11-26 ENCOUNTER — Ambulatory Visit: Payer: Self-pay | Admitting: *Deleted

## 2015-11-26 ENCOUNTER — Non-Acute Institutional Stay (SKILLED_NURSING_FACILITY): Payer: Medicare Other | Admitting: Adult Health

## 2015-11-26 ENCOUNTER — Encounter: Payer: Self-pay | Admitting: *Deleted

## 2015-11-26 ENCOUNTER — Encounter: Payer: Self-pay | Admitting: Adult Health

## 2015-11-26 DIAGNOSIS — K2901 Acute gastritis with bleeding: Secondary | ICD-10-CM

## 2015-11-26 DIAGNOSIS — N186 End stage renal disease: Secondary | ICD-10-CM

## 2015-11-26 DIAGNOSIS — R5381 Other malaise: Secondary | ICD-10-CM | POA: Diagnosis not present

## 2015-11-26 DIAGNOSIS — J309 Allergic rhinitis, unspecified: Secondary | ICD-10-CM | POA: Diagnosis not present

## 2015-11-26 DIAGNOSIS — I482 Chronic atrial fibrillation, unspecified: Secondary | ICD-10-CM

## 2015-11-26 DIAGNOSIS — K5901 Slow transit constipation: Secondary | ICD-10-CM | POA: Diagnosis not present

## 2015-11-26 DIAGNOSIS — M109 Gout, unspecified: Secondary | ICD-10-CM

## 2015-11-26 DIAGNOSIS — Z7901 Long term (current) use of anticoagulants: Secondary | ICD-10-CM | POA: Diagnosis not present

## 2015-11-26 DIAGNOSIS — D62 Acute posthemorrhagic anemia: Secondary | ICD-10-CM

## 2015-11-26 DIAGNOSIS — Z992 Dependence on renal dialysis: Secondary | ICD-10-CM

## 2015-11-26 DIAGNOSIS — M069 Rheumatoid arthritis, unspecified: Secondary | ICD-10-CM | POA: Diagnosis not present

## 2015-11-26 DIAGNOSIS — D508 Other iron deficiency anemias: Secondary | ICD-10-CM | POA: Diagnosis not present

## 2015-11-26 LAB — CHOLESTEROL, BODY FLUID: CHOL FL: 58 mg/dL

## 2015-11-26 NOTE — Progress Notes (Signed)
Patient ID: Bruce Cole, male   DOB: Dec 13, 1940, 75 y.o.   MRN: [name]    DATE:  11/26/2015   MRN:  [name]  BIRTHDAY: March 10, 1941  Facility:  Nursing Home Location:  [place] Room Number: 508-P  LEVEL OF CARE:  SNF [phone])  Contact Information    Name Relation Home Work Mobile   Rubel,Hayward Son [phone]     Bene,[name] Daughter [phone]  [phone]   Agena,Heather Daughter   [phone]       Code Status History    Date Active Date Inactive Code Status Order ID Comments User Context   11/10/2015  9:35 PM 11/25/2015  6:09 PM Full Code [id number]  Vianne Bulls, MD Inpatient   08/17/2015 10:05 PM 08/28/2015  5:09 PM Full Code [id number]  Norval Morton, MD Inpatient       Chief Complaint  Patient presents with  . Hospitalization Follow-up    HISTORY OF PRESENT ILLNESS:  This is a 75 year old male who has been admitted to [facility] on 11/25/15 from [facility]. He has PMH of rheumatoid arthritis, chronic atrial fibrillation on warfarin, chronic systolic CHF and chronic kidney disease is stage IV. He had AV Fistula created in the left upper extremity on 08/26/15.  He was having dyspnea  and was seen @ [place]. CXR showed right-sided pleural effusion with an associated opacity in the right base. He was then transferred to [facility]. On 5/7, thoracentesis done with 1.4L out. On 5/8, AV fistula was created on left upper arm  and left IJ hemodialysis placement was done. He goes to hemodialysis on TTSat. He had EGD on 4/28 and was noted to have mild gastritis and per GI may have cost heme positive stool.  He had blood transfusion of 2 units PRBC on 5/6. Latest hgb is 8.7  He has been admitted for a short-term rehabilitation.  PAST MEDICAL HISTORY:  Past Medical History  Diagnosis Date  . Nephrolithiasis   . Rheumatoid arthritis ([place])   . Colon polyp 2000  . Atrial fibrillation (H[facility])      Chronic  . Unspecified essential hypertension   . Gout   . CHF (congestive heart failure) ([place])   . Symptomatic anemia   . Diabetes mellitus type 2, controlled, with complications ([place])   . CKD (chronic kidney disease), stage IV ([place])   . Chronic combined systolic and diastolic heart failure ([place])   . Acute renal failure superimposed on stage 3 chronic kidney disease ([place])   . AKI (acute kidney injury) ([place])   . Cardiorenal disease   . GI bleed   . Supratherapeutic INR   . Heme positive stool   . Coagulopathy ([place])   . Gastritis and gastroduodenitis   . Pleural effusion   . Tachypnea   . Acute blood loss anemia   . Primary osteoarthritis of right hip   . Thrombocytopenia ([place])   . S/P dialysis catheter insertion (HCC)      CURRENT MEDICATIONS: Reviewed  Patient's Medications  New Prescriptions   No medications on file  Previous Medications   ALBUTEROL (PROVENTIL) (2.5 MG/3ML) 0.083% NEBULIZER SOLUTION    Take 2.5 mg by nebulization every 4 (four) hours as needed for shortness of breath. Reported on 09/25/2015   ALLOPURINOL (ZYLOPRIM) 100 MG TABLET    Take 2 tablets (200 mg total) by mouth daily.   CARVEDILOL (COREG) 3.125 MG TABLET    Take 3.125 mg  by mouth daily.   FLUTICASONE (FLONASE) 50 MCG/ACT NASAL SPRAY    Place 2 sprays into both nostrils daily.   HYDRALAZINE (APRESOLINE) 50 MG TABLET    Take 50 mg by mouth 2 (two) times daily.   HYDROCODONE-ACETAMINOPHEN (NORCO/VICODIN) 5-325 MG TABLET    Take 1-2 tablets by mouth every 4 (four) hours as needed for moderate pain.   HYDROXYCHLOROQUINE (PLAQUENIL) 200 MG TABLET    Take 200 mg by mouth 2 (two) times daily.   MULTIPLE VITAMIN (MULTIVITAMIN WITH MINERALS) TABS TABLET    Take 1 tablet by mouth daily.   PANTOPRAZOLE (PROTONIX) 40 MG TABLET    Take 1 tablet (40 mg total) by mouth 2 (two) times daily.   PROMETHAZINE (PHENERGAN) 25 MG TABLET    Take 1 tablet (25 mg total) by mouth every 6 (six) hours as needed for nausea or  vomiting.   SENNA (SENOKOT) 8.6 MG TABS TABLET    Take 1 tablet (8.6 mg total) by mouth daily as needed for mild constipation or moderate constipation.   SEVELAMER CARBONATE (RENVELA) 800 MG TABLET    Take 1 tablet (800 mg total) by mouth 3 (three) times daily with meals.   WARFARIN (COUMADIN) 7.5 MG TABLET    Take 1 tablet (7.5 mg total) by mouth one time only at 6 PM.  Modified Medications   No medications on file  Discontinued Medications   No medications on file     Allergies  Allergen Reactions  . Ace Inhibitors Other (See Comments)    Worsening renal insufficiency     REVIEW OF SYSTEMS:  GENERAL: no change in appetite, no fatigue, no weight changes, no fever, chills or weakness EYES: Denies change in vision, dry eyes, eye pain, itching or discharge EARS: Denies change in hearing, ringing in ears, or earache NOSE: Denies nasal congestion or epistaxis MOUTH and THROAT: Denies oral discomfort, gingival pain or bleeding, pain from teeth or hoarseness   RESPIRATORY: no cough, SOB, DOE, wheezing, hemoptysis CARDIAC: no chest pain, edema or palpitations GI: no abdominal pain, diarrhea, constipation, heart burn, nausea or vomiting GU: Denies dysuria, frequency, hematuria, incontinence, or discharge PSYCHIATRIC: Denies feeling of depression or anxiety. No report of hallucinations, insomnia, paranoia, or agitation    PHYSICAL EXAMINATION  GENERAL APPEARANCE: Well nourished. In no acute distress. Normal body habitus SKIN:  Skin is warm and dry. Left upper arm AV Fistula and Left IJ dialysis catheter on left chest HEAD: Normal in size and contour. No evidence of trauma EYES: Lids open and close normally. No blepharitis, entropion or ectropion. PERRL. Conjunctivae are clear and sclerae are white. Lenses are without opacity EARS: Pinnae are normal. Patient hears normal voice tunes of the examiner MOUTH and THROAT: Lips are without lesions. Oral mucosa is moist and without lesions.  Tongue is normal in shape, size, and color and without lesions NECK: supple, trachea midline, no neck masses, no thyroid tenderness, no thyromegaly LYMPHATICS: no LAN in the neck, no supraclavicular LAN RESPIRATORY: breathing is even & unlabored, BS CTAB CARDIAC: RRR, no murmur,no extra heart sounds, no edema GI: abdomen soft, normal BS, no masses, no tenderness, no hepatomegaly, no splenomegaly EXTREMITIES:  Able to move X 4 extremities PSYCHIATRIC: Alert and oriented X 3. Affect and behavior are appropriate  LABS/RADIOLOGY: Labs reviewed: Basic Metabolic Panel:  Recent Labs  11/10/15 2144  11/20/15 0823 11/21/15 1409 11/23/15 0532 11/24/15 0808  NA  --   < > 136 135 131* 133*  K  --   < >  4.3 3.9 4.3 3.6  CL  --   < > 95* 97* 94* 95*  CO2  --   < > 27 26 25 26   GLUCOSE  --   < > 90 94 93 118*  BUN  --   < > 45* 39* 43* 31*  CREATININE  --   < > 6.47* 6.37* 6.30* 5.63*  CALCIUM  --   < > 9.3 9.2 9.3 9.2  MG 2.6*  --   --   --   --   --   PHOS  --   < > 4.7* 3.5  --  4.2  < > = values in this interval not displayed. Liver Function Tests:  Recent Labs  08/20/15 0500 08/21/15 0400  11/10/15 1550  11/20/15 0823 11/21/15 1409 11/24/15 0808  AST 36 34  --  29  --   --   --   --   ALT 17 17  --  14*  --   --   --   --   ALKPHOS 99 102  --  114  --   --   --   --   BILITOT 0.7 0.8  --  0.7  --   --   --   --   PROT 6.9 7.3  --  7.1  --   --   --   --   ALBUMIN 3.8 3.6  < > 4.0  < > 3.4* 3.1* 3.4*  < > = values in this interval not displayed.  Recent Labs  12/10/14 1840 04/15/15 0853 11/14/15 0625  LIPASE 53* 47 92*  AMYLASE  --   --  268*   CBC:  Recent Labs  08/17/15 1535 08/18/15 0413  11/10/15 1550  11/21/15 1409 11/22/15 0725 11/23/15 0532 11/24/15 0808  WBC 6.1 5.5  < > 5.9  < > 7.6  --  6.9 9.5  NEUTROABS 4.8 3.9  --  4.7  --   --   --   --   --   HGB 9.2* 9.2*  < > 6.5*  < > 7.0* 9.1* 9.0* 8.7*  HCT 28.7* 28.9*  < > 20.8*  < > 23.4* 28.8* 28.8*  28.2*  MCV 89.1 88.7  < > 88.1  < > 86.7  --  86.5 86.8  PLT 159 162  < > 239  < > 102*  --  124* 114*  < > = values in this interval not displayed.  Lipid Panel:  Recent Labs  03/18/15 0727  HDL 67.40   Cardiac Enzymes:  Recent Labs  08/18/15 0413 08/18/15 1112 11/10/15 1550  TROPONINI 0.08* 0.08* 0.10*     Dg Chest 1 View  11/22/2015  CLINICAL DATA:  Status post right-sided thoracentesis EXAM: CHEST 1 VIEW COMPARISON:  Nov 20, 2015 FINDINGS: There has been interval resolution of right pleural effusion. No pneumothorax evident. Central catheter tip in superior vena cava. No edema or consolidation. Heart remains enlarged with pulmonary vascularity within normal limits. No adenopathy. IMPRESSION: Stable cardiomegaly. No appreciable edema or consolidation. No pneumothorax. No pleural effusion currently evident. Electronically Signed   By: Lowella Grip III M.D.   On: 11/22/2015 09:45   Dg Chest 2 View  11/20/2015  CLINICAL DATA:  Pleural effusion.  Shortness of breath.  Chest pain. EXAM: CHEST  2 VIEW COMPARISON:  11/12/2015 chest radiograph. FINDINGS: Right internal jugular central venous catheter terminates in the right upper mediastinum, unchanged. Stable cardiomediastinal silhouette with mild cardiomegaly. No  pneumothorax. Moderate right pleural effusion is probably unchanged accounting for differences in inspiration. No left pleural effusion. No pulmonary edema. Patchy right lower lung opacity, not appreciably changed. IMPRESSION: 1. Stable mild cardiomegaly without pulmonary edema. 2. Stable moderate right pleural effusion. 3. Stable patchy right lower lung opacity, favor atelectasis. Electronically Signed   By: Ilona Sorrel M.D.   On: 11/20/2015 14:14   Ct Chest Wo Contrast  11/12/2015  CLINICAL DATA:  Shortness of breath. Wheezing. Stage 4 chronic kidney disease. Pleural effusion. EXAM: CT CHEST WITHOUT CONTRAST TECHNIQUE: Multidetector CT imaging of the chest was performed  following the standard protocol without IV contrast. COMPARISON:  11/10/2015 and 08/18/2015 FINDINGS: Mediastinum/Nodes: Prominent contour systemic venous structures. Cardiomegaly, especially involving the right atrium. Low-density along the medial margin of the right auricle as on image 64 series 2, I am uncertain whether this represents the dentated margin or a filling defect such as thrombus. Descending aortic aneurysm, 4.7 cm in the posterior arch. Scattered small lymph nodes. Flattened and nearly occluded appearance of the right mainstem bronchus and bronchus intermedius. There is some loculation of the right pleural effusion along the right anterior mediastinal border. Lungs/Pleura: Large right pleural effusion filling about 70% of the right hemithorax, with associated atelectasis of much of the right lung. Suspected loculated components. There is mild atelectasis in the left lower lobe with airway thickening and near occlusion of left lower lobe segmental bronchi. Upper abdomen: Part of the right posterior thoracic cavity is excluded as it extends well below the left side. Trace perisplenic ascites. Musculoskeletal: Considerable thoracic spondylosis and degenerative disc disease. Bilateral gynecomastia. Diffuse subcutaneous edema. IMPRESSION: 1. Prominent systemic venous structures favoring elevated right heart pressures. Prominent right atrial enlargement and moderate enlargement of the rest of the heart. 2. I am unsure if the low-density posteriorly into the right in the right auricle represents the dentate margin of the right auricle or thrombus. Consider echocardiography with attention to this region. 3. Large right pleural effusion with extensive passive atelectasis. Loculated component of the pleural effusion anteriorly. 4. Prominent airway thickening and near collapse in the right mainstem bronchus, right bronchus intermedius, and in the left lower lobe bronchi. This raises suspicion for the  possibility of bronchomalacia. 5. Diffuse subcutaneous edema and mild perisplenic ascites, likely due to diffuse third spacing of fluid. There is also bilateral gynecomastia. 6. Stable aneurysmal thoracic aorta, measuring up to 4.7 cm along the posterior arch. Electronically Signed   By: Van Clines M.D.   On: 11/12/2015 17:00   Dg Chest Port 1 View  11/23/2015  CLINICAL DATA:  Status post dialysis catheter insertion. EXAM: PORTABLE CHEST 1 VIEW COMPARISON:  Nov 22, 2015. FINDINGS: Stable cardiomegaly. Interval placement of internal jugular dialysis catheter with distal tip in the expected position of the right atrium. No pneumothorax is noted. Left lung is clear. Mild right basilar opacity is noted most consistent with atelectasis or pleural effusion. Bony thorax is unremarkable. IMPRESSION: Interval placement of left internal jugular dialysis catheter. No pneumothorax is noted. Mild right basilar subsegmental atelectasis or effusion is noted. Electronically Signed   By: Marijo Conception, M.D.   On: 11/23/2015 11:58   Dg Chest Port 1 View  11/12/2015  CLINICAL DATA:  Central line placement EXAM: PORTABLE CHEST 1 VIEW COMPARISON:  Multiple exams, including 11/10/2015 FINDINGS: Right IJ line tip is in the lower internal jugular vein, immediately above the brachiocephalic confluence. If SVC placement is desired consider advancing 3 cm. Stable large right pleural  effusion with passive atelectasis. Stable cardiomegaly. IMPRESSION: 1. Right IJ line tip is projects in the lower right internal jugular vein, just above the brachiocephalic confluence. If SVC placement is desired, advanced 3 cm. 2. Stable large right pleural effusion and stable cardiomegaly. Electronically Signed   By: Van Clines M.D.   On: 11/12/2015 18:34   Dg Chest Port 1 View  11/10/2015  CLINICAL DATA:  Short of breath for 4 day EXAM: PORTABLE CHEST 1 VIEW COMPARISON:  08/19/2015 FINDINGS: The heart is moderately enlarged with a  globular appearance. Moderate right pleural effusion and associated pulmonary opacity in the right mid and lower lung zones has developed. There is no pneumothorax. There is no pulmonary edema. There is no pneumothorax or vascular congestion. IMPRESSION: Right pleural effusion and basilar pulmonary opacity. Cardiomegaly is noted. Electronically Signed   By: Marybelle Killings M.D.   On: 11/10/2015 16:12   US Thoracentesis Asp Pleural Space W/img Guide  11/22/2015  INDICATION: End-stage renal disease, CHF, dyspnea, recurrent right pleural effusion. Request made for diagnostic and therapeutic right thoracentesis. EXAM: ULTRASOUND GUIDED DIAGNOSTIC AND THERAPEUTIC RIGHT THORACENTESIS MEDICATIONS: None. COMPLICATIONS: None immediate. PROCEDURE: An ultrasound guided thoracentesis was thoroughly discussed with the patient and questions answered. The benefits, risks, alternatives and complications were also discussed. The patient understands and wishes to proceed with the procedure. Written consent was obtained. Ultrasound was performed to localize and mark an adequate pocket of fluid in the right chest. The area was then prepped and draped in the normal sterile fashion. 1% Lidocaine was used for local anesthesia. Under ultrasound guidance a Safe-T-Centesis catheter was introduced. Thoracentesis was performed. The catheter was removed and a dressing applied. FINDINGS: A total of approximately 1.4 liters of yellow fluid was removed. Samples were sent to the laboratory as requested by the clinical team. IMPRESSION: Successful ultrasound guided diagnostic and therapeutic right thoracentesis yielding 1.4 liters of pleural fluid. Read by: Rowe Robert, PA-C Electronically Signed   By: Aletta Edouard M.D.   On: 11/22/2015 10:13    ASSESSMENT/PLAN:  Physical deconditioning - for rehabilitation  Acute blood loss anemia and chronic renal failure -  S/P transfusion of 2 units PRBC; hgb 8.7; continue ferric gluconate and aranesp  with HD; check CBC  Acute upper GI bleed - S/P EGD on 4/28 was notable for gastritis - continue Protonix 40 mg BID  Chronic atrial fibrillation - rate controlled; continue carvedilol 3.125 mg daily and Coumadin  Long-term use of anticoagulant - INR 1.3, subtherapeutic; increase Coumadin from 7.5 mg to 8 mg by mouth daily, check INR on 11/30/15  ESRD - S/P AV fistula placement 5/8, now on HD TTSat; continue sevelamer carbonate 800 mg 1 tab by mouth 3 times a day with meals  Chronic systolic CHF - EF [id number]; on HD; continue hydralazine and Coreg  Essential hypertension - continue Coreg 3.125 mg daily, hydralazine 50 mg 1 tab by mouth twice a day; check BMP  Rheumatoid arthritis - continue Plaquenil 200 mg 1 tab by mouth twice a day, Norco 5/325 mg 1-2 tabs by mouth every 4 hours when necessary  Gout - continue allopurinol 100 mg take 2 tabs = 200 mg by mouth daily  Allergic rhinitis - continue Flonase 50 g 2 sprays into both nostrils daily  Constipation - continue senna 8.6 mg 1 tab by mouth daily when necessary     Goals of care:  Short-term rehabilitation   Durenda Age, NP [facility] [phone]

## 2015-11-26 NOTE — Patient Outreach (Signed)
[facility]) Care Management  11/26/2015  Bruce Cole August 01, 1940 [name]  Transition of care (week 1)  Pt discharged from the hospital on 5/10 yesterday. RN had a scheduled home visit with pt today however pt is at out patient therapy for rehabilitation and requested to reschedule next week. RN offered to follow up with pt on Monday to reschedule this appointment. Pt receptive as RN will follow up accordingly.  [name], RN Care Management Coordinator [place] [facility] [phone]

## 2015-11-27 ENCOUNTER — Encounter: Payer: Self-pay | Admitting: Internal Medicine

## 2015-11-27 ENCOUNTER — Non-Acute Institutional Stay (SKILLED_NURSING_FACILITY): Payer: Medicare Other | Admitting: Internal Medicine

## 2015-11-27 DIAGNOSIS — Z992 Dependence on renal dialysis: Secondary | ICD-10-CM | POA: Diagnosis not present

## 2015-11-27 DIAGNOSIS — N186 End stage renal disease: Secondary | ICD-10-CM | POA: Diagnosis not present

## 2015-11-27 DIAGNOSIS — I5042 Chronic combined systolic (congestive) and diastolic (congestive) heart failure: Secondary | ICD-10-CM | POA: Diagnosis not present

## 2015-11-27 DIAGNOSIS — R5381 Other malaise: Secondary | ICD-10-CM | POA: Diagnosis not present

## 2015-11-27 DIAGNOSIS — I10 Essential (primary) hypertension: Secondary | ICD-10-CM | POA: Diagnosis not present

## 2015-11-27 DIAGNOSIS — Z7901 Long term (current) use of anticoagulants: Secondary | ICD-10-CM | POA: Diagnosis not present

## 2015-11-27 DIAGNOSIS — M1611 Unilateral primary osteoarthritis, right hip: Secondary | ICD-10-CM

## 2015-11-27 DIAGNOSIS — I482 Chronic atrial fibrillation, unspecified: Secondary | ICD-10-CM

## 2015-11-27 DIAGNOSIS — D62 Acute posthemorrhagic anemia: Secondary | ICD-10-CM

## 2015-11-27 DIAGNOSIS — J90 Pleural effusion, not elsewhere classified: Secondary | ICD-10-CM

## 2015-11-27 DIAGNOSIS — J948 Other specified pleural conditions: Secondary | ICD-10-CM | POA: Diagnosis not present

## 2015-11-27 LAB — CULTURE, BODY FLUID W GRAM STAIN -BOTTLE

## 2015-11-27 LAB — BASIC METABOLIC PANEL
BUN: 29 mg/dL — AB (ref 4–21)
CREATININE: 5.1 mg/dL — AB (ref 0.6–1.3)
GLUCOSE: 86 mg/dL
POTASSIUM: 4.3 mmol/L (ref 3.4–5.3)
Sodium: 135 mmol/L — AB (ref 137–147)

## 2015-11-27 LAB — CBC AND DIFFERENTIAL
HCT: 30 % — AB (ref 41–53)
HEMOGLOBIN: 8.9 g/dL — AB (ref 13.5–17.5)
Neutrophils Absolute: 4 /uL
PLATELETS: 110 10*3/uL — AB (ref 150–399)
WBC: 7 10*3/mL

## 2015-11-27 LAB — CULTURE, BODY FLUID-BOTTLE: CULTURE: NO GROWTH

## 2015-11-27 NOTE — Progress Notes (Signed)
Patient ID: Bruce Cole, male   DOB: August 25, 1940, 75 y.o.   MRN: [name]  Comprehensive exam      Location:  [facility] Room Number: [id number] Place of Service:  SNF (31)  PCP: Estill Dooms, MD Patient Care Team: Estill Dooms, MD as PCP - General (Internal Medicine) Tobi Bastos, RN as [place], LCSW as [place] [facility]  Extended Emergency Contact Information Primary Emergency Contact: Cantrelle,Hayward Address: [address] [place], [place] [address] Johnnette Litter of [place] Phone: [phone] Relation: Son Secondary Emergency Contact: [place] of [place] Phone: [phone] Mobile Phone: [phone] Relation: Daughter  Code Status: full Goals of Care: Advanced Directive information Advanced Directives 11/10/2015  Does patient have an advance directive? No  Would patient like information on creating an advanced directive? No - patient declined information      Chief Complaint  Patient presents with  . New Admit To SNF    following hospitalization 11/10/15 to 11/25/15 symptomatic anemia, right sided pleural effusion.    HPI: Patient is a 75 y.o. male seen today for admission to [facility] on 11/25/2015 following hospitalization from 11/19/2015 through 11/25/2015. Patient presented to the emergency department with dyspnea and a history of anemia and black stools. Acute blood loss anemia was documented with hemoglobin ranging from 7.0-8.7.  Right pleural effusion was noted at the time of admission and he subsequently had a thoracentesis to drain the fluid. This was successful in relieving his dyspnea.  Patient was on anticoagulation with warfarin for chronic atrial fibrillation.  EGD and been done 11/13/2015 which showed gastritis. He received 2 units packed red  blood cells during the previous hospitalization in late April 2017.  Renal function was worse this admission. He had done CK before in the past and I had already received a shot in the left arm. He had not been started on dialysis before this hospitalization, but it was felt prudent to go ahead with dialysis. Shunt revision had to be done during the hospital stay.  Patient has chronic systolic heart failure and diastolic heart failure with cardiomegaly. Previous hospital stay was for treatment of these conditions.  Other problems include hypertension, rheumatoid arthritis, osteoarthritis of the right hip severe enough that hip replacement as needed and is anticipated to be done sometime later this summer in 2017. Patient is mildly obese and has gynecomastia. There are labs that show hyperglycemia. He has had gout. He has a history of colon polyps.  He is anticoagulated with warfarin for atrial fibrillation.  Since [facility], he believes that he is improving with strength. He anticipates going back home where he lives alone.  Past Medical History  Diagnosis Date  . Nephrolithiasis   . Rheumatoid arthritis ([place])   . Colon polyp 2000  . Atrial fibrillation (HCC)     Chronic  . Unspecified essential hypertension   . Gout   . CHF (congestive heart failure) ([place])   . Symptomatic anemia   . Diabetes mellitus type 2, controlled, with complications ([place])   . CKD (chronic kidney disease), stage IV ([place])   . Chronic combined systolic and diastolic heart failure ([place])   . Acute renal failure superimposed on stage 3 chronic kidney disease ([place])   .  AKI (acute kidney injury) ([place])   . Cardiorenal disease   . GI bleed   . Supratherapeutic INR   . Heme positive stool   . Coagulopathy ([place])   . Gastritis and gastroduodenitis   . Pleural effusion   . Tachypnea   . Acute blood loss anemia   . Primary osteoarthritis of right hip   . Thrombocytopenia ([place])   .  S/P dialysis catheter insertion ([place])   . ESRD on dialysis ([place]) 11/27/2015  . DM (diabetes mellitus), type 2 with renal complications ([place]) [id number]    Qualifier: Diagnosis of  By: Inda Castle FNP, [name] S   . OSA (obstructive sleep apnea) 09/02/2013     IMPRESSION :  1. Mild obstructive sleep apnea with hypopneas causing sleep fragmentation and moderate oxygen desaturation.  2. Short runs of nonsustained VT were noted. His beta blocker may need to be titrated 3. Significant PLM's were noted, the PLM arousal index was low. Please correlate with clinical history of restless leg syndrome.  4. Sleep efficiency was poor.  RECOMMENDATION:  1. Treatment options for this degree of sleep disordered breathing include weight loss and positional therapy to avoid supine sleep 2. Consider titrating beta blocker further, defer to cardiologist 3. Patient should be cautioned against driving when sleepy.They should be asked to avoid medications with sedative side effects     . Pleural effusion on right    Past Surgical History  Procedure Laterality Date  . Spine surgery      x 2  . Joint replacement      Total L-Hip replacement, Right Knee 10/20/09  . Cholecystectomy  1994  . Left and right heart catheterization with coronary angiogram N/A 02/22/2013    Procedure: LEFT AND RIGHT HEART CATHETERIZATION WITH CORONARY ANGIOGRAM;  Surgeon: Jolaine Artist, MD;  Location: [facility] CATH LAB;  Service: Cardiovascular;  Laterality: N/A;  . Av fistula placement Left 08/26/2015    Procedure: LEFT RADIOCEPHALIC FISTULA CREATION;  Surgeon: Rosetta Posner, MD;  Location: [facility] OR;  Service: Vascular;  Laterality: Left;  . Esophagogastroduodenoscopy N/A 11/13/2015    Procedure: ESOPHAGOGASTRODUODENOSCOPY (EGD);  Surgeon: Irene Shipper, MD;  Location: [facility] ENDOSCOPY;  Service: Endoscopy;  Laterality: N/A;  . Peripheral vascular catheterization Left 11/19/2015    Procedure: A/V/Fistulagram;  Surgeon: Conrad Fenton, MD;  Location: [facility]  CV LAB;  Service: Cardiovascular;  Laterality: Left;  . Av fistula placement Left 11/23/2015    Procedure: ARTERIOVENOUS (AV) FISTULA CREATION;  Surgeon: Rosetta Posner, MD;  Location: [facility];  Service: Vascular;  Laterality: Left;  . Insertion of dialysis catheter Left 11/23/2015    Procedure: INSERTION OF DIALYSIS CATHETER;  Surgeon: Rosetta Posner, MD;  Location: [facility];  Service: Vascular;  Laterality: Left;    reports that he has never smoked. He has never used smokeless tobacco. He reports that he drinks alcohol. He reports that he does not use illicit drugs. Social History   Social History  . Marital Status: Single    Spouse Name: N/A  . Number of Children: 3  . Years of Education: N/A   Occupational History  . retired Programmer, multimedia     Social History Main Topics  . Smoking status: Never Smoker   . Smokeless tobacco: Never Used  . Alcohol Use: Yes     Comment: occasional  . Drug Use: No  . Sexual Activity: Not on file   Other Topics Concern  . Not on file   Social History Narrative  Former [employer] engineer and [employer]   Admitted to [facility] 11/25/15   Widowed   Never smoked   FULL CODE    Functional Status Survey:    Family History  Problem Relation Age of Onset  . Hypertension Mother   . Arthritis Mother     ?RA  . Hypertension Father     Health Maintenance  Topic Date Due  . FOOT EXAM  09/07/2015  . ZOSTAVAX  12/16/2015 (Originally 05/06/2003)  . URINE MICROALBUMIN  12/16/2015  . INFLUENZA VACCINE  02/16/2016  . HEMOGLOBIN A1C  05/05/2016  . OPHTHALMOLOGY EXAM  07/20/2016  . COLONOSCOPY  09/08/2019  . TETANUS/TDAP  07/18/2021  . PNA vac Low Risk Adult  Completed    Allergies  Allergen Reactions  . Ace Inhibitors Other (See Comments)    Worsening renal insufficiency      Medication List       This list is accurate as of: 11/27/15 11:46 AM.  Always use your most recent med list.               albuterol (2.5 MG/3ML) 0.083% nebulizer  solution  Commonly known as:  PROVENTIL  Take 2.5 mg by nebulization every 4 (four) hours as needed for shortness of breath. Reported on 09/25/2015     allopurinol 100 MG tablet  Commonly known as:  ZYLOPRIM  Take 2 tablets (200 mg total) by mouth daily.     carvedilol 3.125 MG tablet  Commonly known as:  COREG  Take 3.125 mg by mouth daily.     fluticasone 50 MCG/ACT nasal spray  Commonly known as:  FLONASE  Place 2 sprays into both nostrils daily.     hydrALAZINE 50 MG tablet  Commonly known as:  APRESOLINE  Take 50 mg by mouth 2 (two) times daily.     HYDROcodone-acetaminophen 5-325 MG tablet  Commonly known as:  NORCO/VICODIN  Take 1-2 tablets by mouth every 4 (four) hours as needed for moderate pain.     hydroxychloroquine 200 MG tablet  Commonly known as:  PLAQUENIL  Take 200 mg by mouth 2 (two) times daily.     multivitamin with minerals Tabs tablet  Take 1 tablet by mouth daily.     pantoprazole 40 MG tablet  Commonly known as:  PROTONIX  Take 1 tablet (40 mg total) by mouth 2 (two) times daily.     promethazine 25 MG tablet  Commonly known as:  PHENERGAN  Take 1 tablet (25 mg total) by mouth every 6 (six) hours as needed for nausea or vomiting.     senna 8.6 MG Tabs tablet  Commonly known as:  SENOKOT  Take 1 tablet (8.6 mg total) by mouth daily as needed for mild constipation or moderate constipation.     sevelamer carbonate 800 MG tablet  Commonly known as:  RENVELA  Take 1 tablet (800 mg total) by mouth 3 (three) times daily with meals.     warfarin 7.5 MG tablet  Commonly known as:  COUMADIN  Take 1 tablet (7.5 mg total) by mouth one time only at 6 PM.        Review of Systems  Constitutional: Negative for fever, activity change, appetite change, fatigue and unexpected weight change.       Modderately overwweight  HENT: Negative for congestion, ear pain, hearing loss, rhinorrhea, sore throat, tinnitus, trouble swallowing and voice change.   Eyes:         Corrective lenses  Respiratory:  Negative for cough, choking, chest tightness, shortness of breath and wheezing.        Right pleural effusion drained by thoracentesis during last hospital stay.  Cardiovascular: Positive for leg swelling. Negative for chest pain and palpitations.       History of atrial fibrillation. Chronic systolic and diastolic heart failure.  Gastrointestinal: Negative for nausea, abdominal pain, diarrhea, constipation and abdominal distention.       History of gastritis with bleeding in April 2017.  Endocrine: Negative for cold intolerance, heat intolerance, polydipsia, polyphagia and polyuria.       Hyperglycemia  Genitourinary: Negative for dysuria, urgency, frequency and testicular pain.       Not incontinent  Musculoskeletal: Negative for myalgias, back pain, arthralgias, gait problem and neck pain.       History of knee replacements. Chronic osteoarthritis of the right hip. Patient has been told it needs replacement. Pain is severe enough to inhibit walking. History rheumatoid arthritis and gout.  Skin: Negative for color change, pallor and rash.  Allergic/Immunologic: Negative.   Neurological: Negative for dizziness, tremors, syncope, speech difficulty, weakness, numbness and headaches.  Hematological: Negative for adenopathy. Does not bruise/bleed easily.       Anemia with 2 units of blood transfused in April 2017  Psychiatric/Behavioral: Negative for hallucinations, behavioral problems, confusion, sleep disturbance and decreased concentration. The patient is not nervous/anxious.     Filed Vitals:   11/27/15 1135  BP: 96/53  Pulse: 60  Temp: 97.6 F (36.4 C)  Resp: 18  Height: 6\' 1"  (1.854 m)  Weight: 228 lb (103.42 kg)  SpO2: 94%   Body mass index is 30.09 kg/(m^2). Physical Exam  Constitutional: He is oriented to person, place, and time. He appears well-developed and well-nourished. No distress.  Moderately overweight  HENT:  Right Ear:  External ear normal.  Left Ear: External ear normal.  Nose: Nose normal.  Mouth/Throat: Oropharynx is clear and moist. No oropharyngeal exudate.  Eyes: Conjunctivae and EOM are normal. Pupils are equal, round, and reactive to light.  Neck: No JVD present. No tracheal deviation present. No thyromegaly present.  Cardiovascular: Normal rate, regular rhythm, normal heart sounds and intact distal pulses.  Exam reveals no gallop and no friction rub.   No murmur heard. Pulmonary/Chest: No respiratory distress. He has no wheezes. He has no rales. He exhibits no tenderness.  Abdominal: He exhibits no distension and no mass. There is no tenderness.  Musculoskeletal: Normal range of motion. He exhibits edema (2+ bipedal). He exhibits no tenderness.  Bilateral knee replacement scars. In the right hip with weightbearing  Lymphadenopathy:    He has no cervical adenopathy.  Neurological: He is alert and oriented to person, place, and time. He has normal reflexes. No cranial nerve deficit. Coordination normal.  Skin: No rash noted. No erythema. No pallor.  Psychiatric: He has a normal mood and affect. His behavior is normal. Judgment and thought content normal.    Labs reviewed: Basic Metabolic Panel:  Recent Labs  11/10/15 2144  11/20/15 0823 11/21/15 1409 11/23/15 0532 11/24/15 0808  NA  --   < > 136 135 131* 133*  K  --   < > 4.3 3.9 4.3 3.6  CL  --   < > 95* 97* 94* 95*  CO2  --   < > 27 26 25 26   GLUCOSE  --   < > 90 94 93 118*  BUN  --   < > 45* 39* 43* 31*  CREATININE  --   < >  6.47* 6.37* 6.30* 5.63*  CALCIUM  --   < > 9.3 9.2 9.3 9.2  MG 2.6*  --   --   --   --   --   PHOS  --   < > 4.7* 3.5  --  4.2  < > = values in this interval not displayed. Liver Function Tests:  Recent Labs  08/20/15 0500 08/21/15 0400  11/10/15 1550  11/20/15 0823 11/21/15 1409 11/24/15 0808  AST 36 34  --  29  --   --   --   --   ALT 17 17  --  14*  --   --   --   --   ALKPHOS 99 102  --  114  --    --   --   --   BILITOT 0.7 0.8  --  0.7  --   --   --   --   PROT 6.9 7.3  --  7.1  --   --   --   --   ALBUMIN 3.8 3.6  < > 4.0  < > 3.4* 3.1* 3.4*  < > = values in this interval not displayed.  Recent Labs  12/10/14 1840 04/15/15 0853 11/14/15 0625  LIPASE 53* 47 92*  AMYLASE  --   --  268*   No results for input(s): AMMONIA in the last 8760 hours. CBC:  Recent Labs  08/17/15 1535 08/18/15 0413  11/10/15 1550  11/21/15 1409 11/22/15 0725 11/23/15 0532 11/24/15 0808  WBC 6.1 5.5  < > 5.9  < > 7.6  --  6.9 9.5  NEUTROABS 4.8 3.9  --  4.7  --   --   --   --   --   HGB 9.2* 9.2*  < > 6.5*  < > 7.0* 9.1* 9.0* 8.7*  HCT 28.7* 28.9*  < > 20.8*  < > 23.4* 28.8* 28.8* 28.2*  MCV 89.1 88.7  < > 88.1  < > 86.7  --  86.5 86.8  PLT 159 162  < > 239  < > 102*  --  124* 114*  < > = values in this interval not displayed. Cardiac Enzymes:  Recent Labs  08/18/15 0413 08/18/15 1112 11/10/15 1550  TROPONINI 0.08* 0.08* 0.10*   BNP: Invalid input(s): POCBNP Lab Results  Component Value Date   HGBA1C 6.7* 11/04/2015   No results found for: TSH Lab Results  Component Value Date   VITAMINB12 1444* 11/12/2015   Lab Results  Component Value Date   FOLATE 28.5 11/12/2015   Lab Results  Component Value Date   IRON 14* 11/12/2015   TIBC 392 11/12/2015   FERRITIN 18* 11/12/2015    Imaging and Procedures obtained prior to SNF admission: Dg Chest Port 1 View  11/10/2015  CLINICAL DATA:  Short of breath for 4 day EXAM: PORTABLE CHEST 1 VIEW COMPARISON:  08/19/2015 FINDINGS: The heart is moderately enlarged with a globular appearance. Moderate right pleural effusion and associated pulmonary opacity in the right mid and lower lung zones has developed. There is no pneumothorax. There is no pulmonary edema. There is no pneumothorax or vascular congestion. IMPRESSION: Right pleural effusion and basilar pulmonary opacity. Cardiomegaly is noted. Electronically Signed   By: Marybelle Killings  M.D.   On: 11/10/2015 16:12    Assessment/Plan 1. Debility -Engaged in physical therapy and occupational therapy for strengthening, safety mobility, and improvement in self-care skills.  2. ESRD on dialysis ([place]) Continues on dialysis  3 days per week.  3. Chronic combined systolic and diastolic heart failure ([place]) Compensated  4. Chronic atrial fibrillation (HCC) Controlled and anticoagulated  5. Acute blood loss anemia Continue to monitor labs  6. Essential hypertension Controlled  7. Long term (current) use of anticoagulants Continue 8 milligrams daily of warfarin.  8. Pleural effusion on right Resolved since thoracentesis in the hospital. Continue to monitor for recurrence.  9. Primary osteoarthritis of right hip There is still enough discomfort that it inhibits walking at times.

## 2015-11-28 DIAGNOSIS — D508 Other iron deficiency anemias: Secondary | ICD-10-CM | POA: Diagnosis not present

## 2015-11-28 DIAGNOSIS — N186 End stage renal disease: Secondary | ICD-10-CM | POA: Diagnosis not present

## 2015-11-30 ENCOUNTER — Other Ambulatory Visit: Payer: Self-pay | Admitting: *Deleted

## 2015-11-30 ENCOUNTER — Encounter: Payer: Self-pay | Admitting: *Deleted

## 2015-11-30 LAB — CBC AND DIFFERENTIAL
HCT: 29 % — AB (ref 41–53)
Hemoglobin: 8.7 g/dL — AB (ref 13.5–17.5)
NEUTROS ABS: 4 /uL
PLATELETS: 152 10*3/uL (ref 150–399)
WBC: 6.5 10*3/mL

## 2015-11-30 LAB — BASIC METABOLIC PANEL
BUN: 46 mg/dL — AB (ref 4–21)
CREATININE: 6.6 mg/dL — AB (ref 0.6–1.3)
GLUCOSE: 100 mg/dL
Potassium: 4 mmol/L (ref 3.4–5.3)
Sodium: 137 mmol/L (ref 137–147)

## 2015-11-30 NOTE — Patient Outreach (Signed)
[facility]) Care Management  11/30/2015  Bruce Cole [birth year] [id number]   RN attempted outreach call to pt last week upon his discharge not realizing that pt has been sent to a SNF to rescheduled his home visit appointment that was already scheduled. Pt has requested RN call him back to day to reschedule however pt remains in the SNF. RN has informed pt that a Education officer, museum via [facility] would be assigned to follow him closely until he discharges home. RN would then contact him at home and schedule an appointment to resume case management services when he is stable. Pt verbalized an understanding and has agree. Will follow up accordingly  [name], RN Care Management Coordinator [place] Office [phone]

## 2015-12-01 ENCOUNTER — Inpatient Hospital Stay (HOSPITAL_COMMUNITY): Admission: RE | Admit: 2015-12-01 | Payer: Medicare Other | Source: Ambulatory Visit

## 2015-12-01 ENCOUNTER — Ambulatory Visit: Payer: Self-pay | Admitting: *Deleted

## 2015-12-01 DIAGNOSIS — N186 End stage renal disease: Secondary | ICD-10-CM | POA: Diagnosis not present

## 2015-12-01 DIAGNOSIS — D508 Other iron deficiency anemias: Secondary | ICD-10-CM | POA: Diagnosis not present

## 2015-12-02 ENCOUNTER — Other Ambulatory Visit: Payer: Self-pay | Admitting: *Deleted

## 2015-12-02 NOTE — Patient Outreach (Signed)
[facility]) Care Management  12/02/2015  Bruce Cole 06-06-1941 [id number]   CSW was able to make initial contact with patient today to perform phone assessment, as well as assess and assist with social work needs and services.  CSW introduced self, explained role and types of services provided through [place] [facility] ([place] [facility]).  CSW further explained to patient that CSW works with patient's RNCM, also with [place] [facility], Bruce Cole. CSW then explained the reason for the call, indicating that Ms. Bruce Cole thought that patient would benefit from social work services and resources to assist with possible discharge planning needs and services from skilled nursing facility, where patient currently resides to receive short-term rehabilitative services.  CSW obtained two HIPAA compliant identifiers from patient, which included patient's name and date of birth. Patient reports that he is getting much stronger and that he plans to return home to live independently at time of discharge from [facility].  Patient is hopeful that his Discharge Planning Meeting will be performed next week and that he will be released to return back home, shortly thereafter.  Patient denies having any significant form of support and/or assistance in the home, but is agreeable to home health services for a brief period of time.  CSW will continue to follow along to assess and assist with social work needs and services. Bruce [place], BSW, MSW, LCSW  Licensed Education officer, [facility]  Mailing [place] [address] [place], [place] [address] Physical Address-[address], [place], [place] [address] Toll Free Main # [phone] Fax # [phone] Cell # [phone]  Fax # [phone]  [email or web address] Patient's preferred language:  [place]   English  ATTENTION:  If you speak  [place], language assistance services, free of charge, are available to you.    Nondiscrimination and Accessibility Statement: Discrimination is Against the [employer], a subsidiary of [employer], complies with [employer] civil rights laws and does not discriminate on the basis of race, color, national origin, age, disability, or sex.  [facility] does not exclude people or treat them differently because of race, color, national origin, age, disability, or sex.  [place] Providers will:  . Provide free aids and services to people with disabilities to communicate effectively with [place], such as:     ? Qualified sign language interpreters  ? Written information in other formats (large print, audio, accessible electronic formats, other formats)   . Provide free language services to people whose primary language is not [place], such as:    ? Qualified interpreters    ? Information written in other languages   If you need these services, contact your [facility] psychologist.  If you believe that a [facility] [employer] has failed to provide these services or discriminated in another way on the basis of race, color, national origin, age, disability, or sex, you can file a Tourist information centre manager with: [place], [phone] or http://chapman.info/.  You can file a grievance in person or by mail, fax, or email. If you need help filing a grievance, you may contact Bruce [place], Interim Compliance Officer, [place] [facility] Department of Compliance and Integrity, [place]nd Floor, [place], [place]. [place], [phone], [email or web address].    You can also file a civil rights complaint with the U.S. Department of Health and Financial controller, Office for [employer], electronically through the Office  for Civil Rights Complaint Portal, available at  OnSiteLending.nl.jsf, or by mail or phone at:  [place]. Department of Health and Human Services [address], [place] Room [phone], [facility] [place], [place]  [phone], [phone] (TDD) Complaint forms are available at CutFunds.si.

## 2015-12-03 ENCOUNTER — Encounter: Payer: Self-pay | Admitting: *Deleted

## 2015-12-03 DIAGNOSIS — D508 Other iron deficiency anemias: Secondary | ICD-10-CM | POA: Diagnosis not present

## 2015-12-03 DIAGNOSIS — N186 End stage renal disease: Secondary | ICD-10-CM | POA: Diagnosis not present

## 2015-12-04 NOTE — ED Provider Notes (Signed)
CSN: [id number]     Arrival date & time 11/10/15  1515 History   First MD Initiated Contact with Patient 11/10/15 1524     Chief Complaint  Patient presents with  . Shortness of Breath      HPI  Patient presents for evaluation. 4 days of shortness of breath fatigue and nausea. Patient has a medical history significant for chronic hypertension, arthritis, A. fib anticoagulated on warfarin chronic CHF chronic end-stage kidney disease with recent placement of AV fistula with plans for eminent dialysis. Has not required dialysis yet. Last 3-4 days and is slowly worsening shortness of breath nausea and upset stomach worsening peripheral edema and he presents here.    Past Medical History  Diagnosis Date  . Nephrolithiasis   . Rheumatoid arthritis ([place])   . Colon polyp 2000  . Atrial fibrillation (HCC)     Chronic  . Unspecified essential hypertension   . Gout   . CHF (congestive heart failure) ([place])   . Symptomatic anemia   . Diabetes mellitus type 2, controlled, with complications ([place])   . CKD (chronic kidney disease), stage IV ([place])   . Chronic combined systolic and diastolic heart failure ([place])   . Acute renal failure superimposed on stage 3 chronic kidney disease ([place])   . AKI (acute kidney injury) ([place])   . Cardiorenal disease   . GI bleed   . Supratherapeutic INR   . Heme positive stool   . Coagulopathy ([place])   . Gastritis and gastroduodenitis   . Pleural effusion   . Tachypnea   . Acute blood loss anemia   . Primary osteoarthritis of right hip   . Thrombocytopenia ([place])   . S/P dialysis catheter insertion ([place])   . ESRD on dialysis ([place]) 11/27/2015  . DM (diabetes mellitus), type 2 with renal complications ([place]) [id number]    Qualifier: Diagnosis of  By: Inda Castle FNP, [name] S   . OSA (obstructive sleep apnea) 09/02/2013     IMPRESSION :  1. Mild obstructive sleep apnea with hypopneas causing sleep fragmentation and moderate oxygen desaturation.  2. Short runs of  nonsustained VT were noted. His beta blocker may need to be titrated 3. Significant PLM's were noted, the PLM arousal index was low. Please correlate with clinical history of restless leg syndrome.  4. Sleep efficiency was poor.  RECOMMENDATION:  1. Treatment options for this degree of sleep disordered breathing include weight loss and positional therapy to avoid supine sleep 2. Consider titrating beta blocker further, defer to cardiologist 3. Patient should be cautioned against driving when sleepy.They should be asked to avoid medications with sedative side effects     . Pleural effusion on right    Past Surgical History  Procedure Laterality Date  . Spine surgery      x 2  . Joint replacement      Total L-Hip replacement, Right Knee 10/20/09  . Cholecystectomy  1994  . Left and right heart catheterization with coronary angiogram N/A 02/22/2013    Procedure: LEFT AND RIGHT HEART CATHETERIZATION WITH CORONARY ANGIOGRAM;  Surgeon: Jolaine Artist, MD;  Location: [facility] CATH LAB;  Service: Cardiovascular;  Laterality: N/A;  . Av fistula placement Left 08/26/2015    Procedure: LEFT RADIOCEPHALIC FISTULA CREATION;  Surgeon: Rosetta Posner, MD;  Location: [facility] OR;  Service: Vascular;  Laterality: Left;  . Esophagogastroduodenoscopy N/A 11/13/2015    Procedure: ESOPHAGOGASTRODUODENOSCOPY (EGD);  Surgeon: Irene Shipper, MD;  Location: [facility] ENDOSCOPY;  Service: Endoscopy;  Laterality:  N/A;  . Peripheral vascular catheterization Left 11/19/2015    Procedure: A/V/Fistulagram;  Surgeon: Conrad [facility], MD;  Location: [place] CV LAB;  Service: Cardiovascular;  Laterality: Left;  . Av fistula placement Left 11/23/2015    Procedure: ARTERIOVENOUS (AV) FISTULA CREATION;  Surgeon: Rosetta Posner, MD;  Location: [place];  Service: Vascular;  Laterality: Left;  . Insertion of dialysis catheter Left 11/23/2015    Procedure: INSERTION OF DIALYSIS CATHETER;  Surgeon: Rosetta Posner, MD;  Location: [facility] OR;  Service: Vascular;  Laterality:  Left;   Family History  Problem Relation Age of Onset  . Hypertension Mother   . Arthritis Mother     ?RA  . Hypertension Father    Social History  Substance Use Topics  . Smoking status: Never Smoker   . Smokeless tobacco: Never Used  . Alcohol Use: Yes     Comment: occasional    Review of Systems  Constitutional: Positive for fatigue. Negative for fever, chills, diaphoresis and appetite change.  HENT: Negative for mouth sores, sore throat and trouble swallowing.   Eyes: Negative for visual disturbance.  Respiratory: Positive for shortness of breath. Negative for cough, chest tightness and wheezing.   Cardiovascular: Positive for leg swelling. Negative for chest pain.  Gastrointestinal: Positive for nausea. Negative for vomiting, abdominal pain, diarrhea and abdominal distention.  Endocrine: Negative for polydipsia, polyphagia and polyuria.  Genitourinary: Negative for dysuria, frequency and hematuria.  Musculoskeletal: Negative for gait problem.  Skin: Negative for color change, pallor and rash.  Neurological: Negative for dizziness, syncope, light-headedness and headaches.  Hematological: Does not bruise/bleed easily.  Psychiatric/Behavioral: Negative for behavioral problems and confusion.      Allergies  Ace inhibitors  Home Medications   Prior to Admission medications   Medication Sig Start Date End Date Taking? Authorizing Provider  albuterol (PROVENTIL) (2.5 MG/3ML) 0.083% nebulizer solution Take 2.5 mg by nebulization every 4 (four) hours as needed for shortness of breath. Reported on 09/25/2015   Yes Historical Provider, MD  carvedilol (COREG) 3.125 MG tablet Take 3.125 mg by mouth daily.   Yes Historical Provider, MD  fluticasone (FLONASE) 50 MCG/ACT nasal spray Place 2 sprays into both nostrils daily. 03/18/15  Yes Debbrah Alar, NP  hydrALAZINE (APRESOLINE) 50 MG tablet Take 50 mg by mouth 2 (two) times daily.   Yes Historical Provider, MD   hydroxychloroquine (PLAQUENIL) 200 MG tablet Take 200 mg by mouth 2 (two) times daily. 09/11/14  Yes Bo Merino, MD  Multiple Vitamin (MULTIVITAMIN WITH MINERALS) TABS tablet Take 1 tablet by mouth daily.   Yes Historical Provider, MD  allopurinol (ZYLOPRIM) 100 MG tablet Take 2 tablets (200 mg total) by mouth daily. 11/25/15   Robbie Lis, MD  HYDROcodone-acetaminophen (NORCO/VICODIN) 5-325 MG tablet Take 1-2 tablets by mouth every 4 (four) hours as needed for moderate pain. 11/25/15   Robbie Lis, MD  pantoprazole (PROTONIX) 40 MG tablet Take 1 tablet (40 mg total) by mouth 2 (two) times daily. 11/25/15   Robbie Lis, MD  promethazine (PHENERGAN) 25 MG tablet Take 1 tablet (25 mg total) by mouth every 6 (six) hours as needed for nausea or vomiting. 11/25/15   Robbie Lis, MD  senna (SENOKOT) 8.6 MG TABS tablet Take 1 tablet (8.6 mg total) by mouth daily as needed for mild constipation or moderate constipation. 11/25/15   Robbie Lis, MD  sevelamer carbonate (RENVELA) 800 MG tablet Take 1 tablet (800 mg total) by mouth 3 (  three) times daily with meals. 11/25/15   Robbie Lis, MD  warfarin (COUMADIN) 7.5 MG tablet Take 1 tablet (7.5 mg total) by mouth one time only at 6 PM. 11/25/15   Robbie Lis, MD   BP 94/55 mmHg  Pulse 58  Temp(Src) 98.1 F (36.7 C) (Oral)  Resp 18  Ht 6\' 3"  (1.905 m)  Wt 228 lb 11.2 oz (103.738 kg)  BMI 28.59 kg/m2  SpO2 96% Physical Exam  Constitutional: He is oriented to person, place, and time. He appears well-developed and well-nourished. No distress.  HENT:  Head: Normocephalic.  Eyes: Conjunctivae are normal. Pupils are equal, round, and reactive to light. No scleral icterus.  Neck: Normal range of motion. Neck supple. No thyromegaly present.  Cardiovascular: Normal rate and regular rhythm.  Exam reveals no gallop and no friction rub.   No murmur heard. Pulmonary/Chest: Effort normal. No respiratory distress. He has decreased breath sounds in the  right lower field and the left lower field. He has no wheezes. He has no rales.  Pacer crackles. Dependent edema.  Abdominal: Soft. Bowel sounds are normal. He exhibits no distension. There is no tenderness. There is no rebound.  Musculoskeletal: Normal range of motion.  Neurological: He is alert and oriented to person, place, and time.  Skin: Skin is warm and dry. No rash noted.  Psychiatric: He has a normal mood and affect. His behavior is normal.    ED Course  Procedures (including critical care time) Labs Review Labs Reviewed  CBC WITH DIFFERENTIAL/PLATELET - Abnormal; Notable for the following:    RBC 2.36 (*)    Hemoglobin 6.5 (*)    HCT 20.8 (*)    RDW 17.8 (*)    All other components within normal limits  BASIC METABOLIC PANEL - Abnormal; Notable for the following:    Chloride 93 (*)    Glucose, Bld 108 (*)    BUN 128 (*)    Creatinine, Ser 5.93 (*)    GFR calc non Af Amer 8 (*)    GFR calc Af Amer 10 (*)    Anion gap 18 (*)    All other components within normal limits  TROPONIN I - Abnormal; Notable for the following:    Troponin I 0.10 (*)    All other components within normal limits  BRAIN NATRIURETIC PEPTIDE - Abnormal; Notable for the following:    B Natriuretic Peptide 284.6 (*)    All other components within normal limits  PROTIME-INR - Abnormal; Notable for the following:    Prothrombin Time 49.0 (*)    INR 5.58 (*)    All other components within normal limits  OCCULT BLOOD X 1 CARD TO LAB, STOOL - Abnormal; Notable for the following:    Fecal Occult Bld POSITIVE (*)    All other components within normal limits  HEPATIC FUNCTION PANEL - Abnormal; Notable for the following:    ALT 14 (*)    All other components within normal limits  MAGNESIUM - Abnormal; Notable for the following:    Magnesium 2.6 (*)    All other components within normal limits  BASIC METABOLIC PANEL - Abnormal; Notable for the following:    Chloride 95 (*)    Glucose, Bld 104 (*)     BUN 137 (*)    Creatinine, Ser 6.10 (*)    GFR calc non Af Amer 8 (*)    GFR calc Af Amer 10 (*)    All other components within normal  limits  PROTIME-INR - Abnormal; Notable for the following:    Prothrombin Time 45.2 (*)    INR 5.03 (*)    All other components within normal limits  RENAL FUNCTION PANEL - Abnormal; Notable for the following:    Chloride 95 (*)    Glucose, Bld 107 (*)    BUN 136 (*)    Creatinine, Ser 5.99 (*)    Phosphorus 5.5 (*)    GFR calc non Af Amer 8 (*)    GFR calc Af Amer 10 (*)    All other components within normal limits  CBC - Abnormal; Notable for the following:    RBC 2.85 (*)    Hemoglobin 7.6 (*)    HCT 24.6 (*)    RDW 17.3 (*)    All other components within normal limits  PROTIME-INR - Abnormal; Notable for the following:    Prothrombin Time 36.6 (*)    INR 3.80 (*)    All other components within normal limits  VITAMIN B12 - Abnormal; Notable for the following:    Vitamin B-12 1444 (*)    All other components within normal limits  IRON AND TIBC - Abnormal; Notable for the following:    Iron 14 (*)    Saturation Ratios 4 (*)    All other components within normal limits  FERRITIN - Abnormal; Notable for the following:    Ferritin 18 (*)    All other components within normal limits  RETICULOCYTES - Abnormal; Notable for the following:    RBC. 2.79 (*)    All other components within normal limits  CBC - Abnormal; Notable for the following:    RBC 2.79 (*)    Hemoglobin 7.6 (*)    HCT 24.4 (*)    RDW 17.6 (*)    All other components within normal limits  RENAL FUNCTION PANEL - Abnormal; Notable for the following:    Chloride 93 (*)    Glucose, Bld 118 (*)    BUN 136 (*)    Creatinine, Ser 6.77 (*)    Phosphorus 6.0 (*)    GFR calc non Af Amer 7 (*)    GFR calc Af Amer 8 (*)    All other components within normal limits  BLOOD GAS, ARTERIAL - Abnormal; Notable for the following:    pCO2 arterial 52.8 (*)    pO2, Arterial 79.3 (*)     Bicarbonate 29.8 (*)    Acid-Base Excess 4.8 (*)    All other components within normal limits  PROTIME-INR - Abnormal; Notable for the following:    Prothrombin Time 37.2 (*)    INR 3.88 (*)    All other components within normal limits  RENAL FUNCTION PANEL - Abnormal; Notable for the following:    Chloride 94 (*)    Glucose, Bld 107 (*)    BUN 116 (*)    Creatinine, Ser 6.53 (*)    Phosphorus 5.9 (*)    GFR calc non Af Amer 8 (*)    GFR calc Af Amer 9 (*)    All other components within normal limits  CBC - Abnormal; Notable for the following:    RBC 2.82 (*)    Hemoglobin 7.8 (*)    HCT 25.5 (*)    RDW 17.6 (*)    All other components within normal limits  PROTIME-INR - Abnormal; Notable for the following:    Prothrombin Time 33.5 (*)    INR 3.39 (*)    All other  components within normal limits  RENAL FUNCTION PANEL - Abnormal; Notable for the following:    Chloride 96 (*)    Glucose, Bld 117 (*)    BUN 85 (*)    Creatinine, Ser 6.01 (*)    Phosphorus 5.2 (*)    GFR calc non Af Amer 8 (*)    GFR calc Af Amer 10 (*)    All other components within normal limits  CBC - Abnormal; Notable for the following:    RBC 2.86 (*)    Hemoglobin 7.8 (*)    HCT 25.5 (*)    RDW 17.6 (*)    Platelets 138 (*)    All other components within normal limits  AMYLASE - Abnormal; Notable for the following:    Amylase 268 (*)    All other components within normal limits  LIPASE, BLOOD - Abnormal; Notable for the following:    Lipase 92 (*)    All other components within normal limits  PROTIME-INR - Abnormal; Notable for the following:    Prothrombin Time 30.2 (*)    INR 2.95 (*)    All other components within normal limits  RENAL FUNCTION PANEL - Abnormal; Notable for the following:    Chloride 97 (*)    Glucose, Bld 100 (*)    BUN 59 (*)    Creatinine, Ser 5.27 (*)    Phosphorus 5.0 (*)    GFR calc non Af Amer 10 (*)    GFR calc Af Amer 11 (*)    All other components within  normal limits  CBC - Abnormal; Notable for the following:    RBC 2.80 (*)    Hemoglobin 7.8 (*)    HCT 25.1 (*)    RDW 17.5 (*)    Platelets 133 (*)    All other components within normal limits  RENAL FUNCTION PANEL - Abnormal; Notable for the following:    Chloride 99 (*)    BUN 61 (*)    Creatinine, Ser 5.31 (*)    Phosphorus 5.1 (*)    GFR calc non Af Amer 10 (*)    GFR calc Af Amer 11 (*)    All other components within normal limits  PROTIME-INR - Abnormal; Notable for the following:    Prothrombin Time 24.8 (*)    INR 2.27 (*)    All other components within normal limits  CBC - Abnormal; Notable for the following:    RBC 2.73 (*)    Hemoglobin 7.7 (*)    HCT 24.2 (*)    RDW 17.7 (*)    Platelets 132 (*)    All other components within normal limits  RENAL FUNCTION PANEL - Abnormal; Notable for the following:    Sodium 134 (*)    Chloride 95 (*)    Glucose, Bld 103 (*)    BUN 71 (*)    Creatinine, Ser 6.67 (*)    Phosphorus 5.1 (*)    GFR calc non Af Amer 7 (*)    GFR calc Af Amer 9 (*)    All other components within normal limits  CBC - Abnormal; Notable for the following:    RBC 2.73 (*)    Hemoglobin 7.6 (*)    HCT 24.2 (*)    RDW 17.6 (*)    Platelets 123 (*)    All other components within normal limits  RENAL FUNCTION PANEL - Abnormal; Notable for the following:    Chloride 99 (*)    BUN 43 (*)  Creatinine, Ser 4.70 (*)    GFR calc non Af Amer 11 (*)    GFR calc Af Amer 13 (*)    All other components within normal limits  PROTIME-INR - Abnormal; Notable for the following:    Prothrombin Time 22.2 (*)    [name]5 (*)    All other components within normal limits  RENAL FUNCTION PANEL - Abnormal; Notable for the following:    Chloride 99 (*)    BUN 52 (*)    Creatinine, Ser 6.03 (*)    GFR calc non Af Amer 8 (*)    GFR calc Af Amer 10 (*)    All other components within normal limits  CBC - Abnormal; Notable for the following:    RBC 2.80 (*)     Hemoglobin 7.6 (*)    HCT 24.6 (*)    RDW 17.4 (*)    Platelets 92 (*)    All other components within normal limits  PROTIME-INR - Abnormal; Notable for the following:    Prothrombin Time 20.0 (*)    INR 1.70 (*)    All other components within normal limits  RENAL FUNCTION PANEL - Abnormal; Notable for the following:    Chloride 98 (*)    Glucose, Bld 104 (*)    BUN 38 (*)    Creatinine, Ser 5.48 (*)    GFR calc non Af Amer 9 (*)    GFR calc Af Amer 11 (*)    All other components within normal limits  CBC - Abnormal; Notable for the following:    RBC 2.72 (*)    Hemoglobin 7.5 (*)    HCT 24.5 (*)    RDW 17.5 (*)    Platelets 101 (*)    All other components within normal limits  BASIC METABOLIC PANEL - Abnormal; Notable for the following:    Chloride 98 (*)    Glucose, Bld 105 (*)    BUN 38 (*)    Creatinine, Ser 5.54 (*)    GFR calc non Af Amer 9 (*)    GFR calc Af Amer 11 (*)    All other components within normal limits  PROTIME-INR - Abnormal; Notable for the following:    Prothrombin Time 19.4 (*)    INR 1.64 (*)    All other components within normal limits  CBC - Abnormal; Notable for the following:    RBC 2.67 (*)    Hemoglobin 7.4 (*)    HCT 24.3 (*)    RDW 17.7 (*)    Platelets 79 (*)    All other components within normal limits  BASIC METABOLIC PANEL - Abnormal; Notable for the following:    Chloride 99 (*)    Glucose, Bld 102 (*)    BUN 31 (*)    Creatinine, Ser 5.17 (*)    GFR calc non Af Amer 10 (*)    GFR calc Af Amer 12 (*)    All other components within normal limits  RENAL FUNCTION PANEL - Abnormal; Notable for the following:    Chloride 99 (*)    Glucose, Bld 114 (*)    BUN 28 (*)    Creatinine, Ser 4.91 (*)    Albumin 3.4 (*)    GFR calc non Af Amer 11 (*)    GFR calc Af Amer 12 (*)    All other components within normal limits  CBC - Abnormal; Notable for the following:    RBC 2.80 (*)  Hemoglobin 7.5 (*)    HCT 24.6 (*)    RDW 17.5  (*)    Platelets 79 (*)    All other components within normal limits  CBC - Abnormal; Notable for the following:    RBC 2.73 (*)    Hemoglobin 7.3 (*)    HCT 24.4 (*)    MCHC 29.9 (*)    RDW 17.6 (*)    Platelets 89 (*)    All other components within normal limits  CREATININE, SERUM - Abnormal; Notable for the following:    Creatinine, Ser 5.69 (*)    GFR calc non Af Amer 9 (*)    GFR calc Af Amer 10 (*)    All other components within normal limits  PROTIME-INR - Abnormal; Notable for the following:    Prothrombin Time 17.6 (*)    All other components within normal limits  RENAL FUNCTION PANEL - Abnormal; Notable for the following:    Chloride 95 (*)    BUN 45 (*)    Creatinine, Ser 6.47 (*)    Phosphorus 4.7 (*)    Albumin 3.4 (*)    GFR calc non Af Amer 8 (*)    GFR calc Af Amer 9 (*)    All other components within normal limits  CBC - Abnormal; Notable for the following:    RBC 2.65 (*)    Hemoglobin 7.3 (*)    HCT 23.6 (*)    RDW 17.7 (*)    Platelets 100 (*)    All other components within normal limits  RENAL FUNCTION PANEL - Abnormal; Notable for the following:    Chloride 98 (*)    Glucose, Bld 111 (*)    BUN 40 (*)    Creatinine, Ser 6.26 (*)    Albumin 3.2 (*)    GFR calc non Af Amer 8 (*)    GFR calc Af Amer 9 (*)    All other components within normal limits  CBC - Abnormal; Notable for the following:    RBC 2.68 (*)    Hemoglobin 7.3 (*)    HCT 23.9 (*)    RDW 17.7 (*)    Platelets 90 (*)    All other components within normal limits  PROTIME-INR - Abnormal; Notable for the following:    Prothrombin Time 17.4 (*)    All other components within normal limits  RENAL FUNCTION PANEL - Abnormal; Notable for the following:    Chloride 97 (*)    BUN 39 (*)    Creatinine, Ser 6.37 (*)    Albumin 3.1 (*)    GFR calc non Af Amer 8 (*)    GFR calc Af Amer 9 (*)    All other components within normal limits  CBC - Abnormal; Notable for the following:    RBC  2.70 (*)    Hemoglobin 7.0 (*)    HCT 23.4 (*)    MCH 25.9 (*)    MCHC 29.9 (*)    RDW 17.7 (*)    Platelets 102 (*)    All other components within normal limits  PROTIME-INR - Abnormal; Notable for the following:    Prothrombin Time 17.1 (*)    All other components within normal limits  HEMOGLOBIN AND HEMATOCRIT, BLOOD - Abnormal; Notable for the following:    Hemoglobin 9.1 (*)    HCT 28.8 (*)    All other components within normal limits  LACTATE DEHYDROGENASE, BODY FLUID - Abnormal; Notable for the following:  LD, Fluid 151 (*)    All other components within normal limits  BODY FLUID CELL COUNT WITH DIFFERENTIAL - Abnormal; Notable for the following:    Appearance, Fluid HAZY (*)    All other components within normal limits  PROTIME-INR - Abnormal; Notable for the following:    Prothrombin Time 15.3 (*)    All other components within normal limits  BASIC METABOLIC PANEL - Abnormal; Notable for the following:    Sodium 131 (*)    Chloride 94 (*)    BUN 43 (*)    Creatinine, Ser 6.30 (*)    GFR calc non Af Amer 8 (*)    GFR calc Af Amer 9 (*)    All other components within normal limits  CBC - Abnormal; Notable for the following:    RBC 3.33 (*)    Hemoglobin 9.0 (*)    HCT 28.8 (*)    RDW 17.1 (*)    Platelets 124 (*)    All other components within normal limits  PROTIME-INR - Abnormal; Notable for the following:    Prothrombin Time 16.0 (*)    All other components within normal limits  CBC - Abnormal; Notable for the following:    RBC 3.25 (*)    Hemoglobin 8.7 (*)    HCT 28.2 (*)    RDW 17.3 (*)    Platelets 114 (*)    All other components within normal limits  RENAL FUNCTION PANEL - Abnormal; Notable for the following:    Sodium 133 (*)    Chloride 95 (*)    Glucose, Bld 118 (*)    BUN 31 (*)    Creatinine, Ser 5.63 (*)    Albumin 3.4 (*)    GFR calc non Af Amer 9 (*)    GFR calc Af Amer 10 (*)    All other components within normal limits  PARATHYROID  HORMONE, INTACT (NO CA) - Abnormal; Notable for the following:    PTH 104 (*)    All other components within normal limits  PROTIME-INR - Abnormal; Notable for the following:    Prothrombin Time 17.0 (*)    All other components within normal limits  MRSA PCR SCREENING  CULTURE, BODY FLUID-BOTTLE  GRAM STAIN  URINALYSIS, ROUTINE W REFLEX MICROSCOPIC (NOT AT [facility])  FOLATE  HEPATITIS B SURFACE ANTIGEN  HEPATITIS B SURFACE ANTIBODY  HEPATITIS B CORE ANTIBODY, TOTAL  PROTEIN, BODY FLUID  TRIGLYCERIDES, BODY FLUIDS  CHOLESTEROL, BODY FLUID  GLUCOSE, SEROUS FLUID  PH, BODY FLUID  MISC [facility] TEST (SEND OUT)  TYPE AND SCREEN  PREPARE RBC (CROSSMATCH)  PREPARE RBC (CROSSMATCH)  TYPE AND SCREEN  PREPARE RBC (CROSSMATCH)  CYTOLOGY - NON PAP    Imaging Review No results found. I have personally reviewed and evaluated these images and lab results as part of my medical decision-making.   EKG Interpretation   Date/Time:  Tuesday November 10 2015 15:39:07 EDT Ventricular Rate:  58 PR Interval:    QRS Duration: 82 QT Interval:  424 QTC Calculation: 416 R Axis:   -61 Text Interpretation:  backgroing noise difficult to see p waves No  significant change since last tracing Confirmed by FLOYD MD, Quillian Quince  [id number]) on 11/11/2015 7:17:34 PM      MDM   Final diagnoses:  AKI (acute kidney injury) ([place])  CRI (chronic renal insufficiency), stage 4 (severe) (HCC)  Congestive heart failure, unspecified congestive heart failure chronicity, unspecified congestive heart failure type (HCC)  Upper GI bleed  Coagulopathy ([place])    Ranges made for transfer to [facility]. Patient has end-stage cardiorenal syndrome with progressive end-stage renal disease. He is not requiring O2 here. His become more anemic with his anemia of chronic disease. May require transfusion. Will likely require multi-special intervention.    Tanna Furry, MD 12/04/15 [phone]

## 2015-12-05 DIAGNOSIS — N186 End stage renal disease: Secondary | ICD-10-CM | POA: Diagnosis not present

## 2015-12-05 DIAGNOSIS — D508 Other iron deficiency anemias: Secondary | ICD-10-CM | POA: Diagnosis not present

## 2015-12-07 ENCOUNTER — Other Ambulatory Visit: Payer: Self-pay | Admitting: Adult Health

## 2015-12-07 ENCOUNTER — Encounter: Payer: Self-pay | Admitting: Adult Health

## 2015-12-07 ENCOUNTER — Non-Acute Institutional Stay (SKILLED_NURSING_FACILITY): Payer: Medicare Other | Admitting: Adult Health

## 2015-12-07 ENCOUNTER — Other Ambulatory Visit: Payer: Self-pay | Admitting: Internal Medicine

## 2015-12-07 DIAGNOSIS — R5381 Other malaise: Secondary | ICD-10-CM

## 2015-12-07 DIAGNOSIS — I482 Chronic atrial fibrillation, unspecified: Secondary | ICD-10-CM

## 2015-12-07 DIAGNOSIS — M109 Gout, unspecified: Secondary | ICD-10-CM | POA: Diagnosis not present

## 2015-12-07 DIAGNOSIS — I10 Essential (primary) hypertension: Secondary | ICD-10-CM

## 2015-12-07 DIAGNOSIS — D62 Acute posthemorrhagic anemia: Secondary | ICD-10-CM

## 2015-12-07 DIAGNOSIS — I5042 Chronic combined systolic (congestive) and diastolic (congestive) heart failure: Secondary | ICD-10-CM

## 2015-12-07 DIAGNOSIS — Z992 Dependence on renal dialysis: Secondary | ICD-10-CM | POA: Diagnosis not present

## 2015-12-07 DIAGNOSIS — K2901 Acute gastritis with bleeding: Secondary | ICD-10-CM

## 2015-12-07 DIAGNOSIS — M069 Rheumatoid arthritis, unspecified: Secondary | ICD-10-CM | POA: Diagnosis not present

## 2015-12-07 DIAGNOSIS — K5901 Slow transit constipation: Secondary | ICD-10-CM | POA: Diagnosis not present

## 2015-12-07 DIAGNOSIS — N186 End stage renal disease: Secondary | ICD-10-CM | POA: Diagnosis not present

## 2015-12-07 DIAGNOSIS — J309 Allergic rhinitis, unspecified: Secondary | ICD-10-CM

## 2015-12-07 NOTE — Progress Notes (Signed)
Patient ID: MATHEW BRUSTAD, male   DOB: Jun 11, 1941, 75 y.o.   MRN: [name]    DATE:  12/07/2015   MRN:  [name]  BIRTHDAY: [birth year]  Facility:  Nursing Home Location:  [facility] Room Number: 508-P  LEVEL OF CARE:  SNF [id number])  Contact Information    Name Relation Home Work Mobile   Freimark,Hayward Son [phone]     Califf,[name] Daughter [phone]  [phone]   Leckrone,Heather Daughter   [phone]       Code Status History    Date Active Date Inactive Code Status Order ID Comments User Context   11/10/2015  9:35 PM 11/25/2015  6:09 PM Full Code [id number]  Vianne Bulls, MD Inpatient   08/17/2015 10:05 PM 08/28/2015  5:09 PM Full Code [id number]  Norval Morton, MD Inpatient       Chief Complaint  Patient presents with  . Discharge Note    HISTORY OF PRESENT ILLNESS:  This is a 75 year old male who is for discharge home with Home health PT for endurance, OT for ADLs and skilled Nursing for disease management. DME:  3-in-1 bedside commode.  He has been admitted to [facility] on 11/25/15 from [facility]. He has PMH of rheumatoid arthritis, chronic atrial fibrillation on warfarin, chronic systolic CHF and chronic kidney disease is stage IV. He had AV Fistula created in the left upper extremity on 08/26/15.  He was having dyspnea  and was seen @ [facility]. CXR showed right-sided pleural effusion with an associated opacity in the right base. He was then transferred to [facility]. On 5/7, thoracentesis done with 1.4L out. On 5/8, AV fistula was created on left upper arm  and left IJ hemodialysis placement was done. He goes to hemodialysis on TTSat. He had EGD on 4/28 and was noted to have mild gastritis and per GI may have cost heme positive stool.  He had blood transfusion of 2 units PRBC on 5/6.   Patient was admitted to this facility for short-term rehabilitation after the patient's recent  hospitalization.  Patient has completed SNF rehabilitation and therapy has cleared the patient for discharge.   PAST MEDICAL HISTORY:  Past Medical History  Diagnosis Date  . Nephrolithiasis   . Rheumatoid arthritis ([place])   . Colon polyp 2000  . Atrial fibrillation (HCC)     Chronic  . Unspecified essential hypertension   . Gout   . CHF (congestive heart failure) ([place])   . Symptomatic anemia   . Diabetes mellitus type 2, controlled, with complications ([place])   . CKD (chronic kidney disease), stage IV ([place])   . Chronic combined systolic and diastolic heart failure ([place])   . Acute renal failure superimposed on stage 3 chronic kidney disease ([place])   . AKI (acute kidney injury) ([place])   . Cardiorenal disease   . GI bleed   . Supratherapeutic INR   . Heme positive stool   . Coagulopathy ([place])   . Gastritis and gastroduodenitis   . Pleural effusion   . Tachypnea   . Acute blood loss anemia   . Primary osteoarthritis of right hip   . Thrombocytopenia ([place])   . S/P dialysis catheter insertion ([place])   . ESRD on dialysis ([place]) 11/27/2015  . DM (diabetes mellitus), type 2 with renal complications ([place]) [id number]    Qualifier: Diagnosis of  By: Inda Castle FNP, [name] S   . OSA (obstructive sleep apnea) 09/02/2013  IMPRESSION :  1. Mild obstructive sleep apnea with hypopneas causing sleep fragmentation and moderate oxygen desaturation.  2. Short runs of nonsustained VT were noted. His beta blocker may need to be titrated 3. Significant PLM's were noted, the PLM arousal index was low. Please correlate with clinical history of restless leg syndrome.  4. Sleep efficiency was poor.  RECOMMENDATION:  1. Treatment options for this degree of sleep disordered breathing include weight loss and positional therapy to avoid supine sleep 2. Consider titrating beta blocker further, defer to cardiologist 3. Patient should be cautioned against driving when sleepy.They should be asked to avoid medications  with sedative side effects     . Pleural effusion on right      CURRENT MEDICATIONS: Reviewed  Patient's Medications  New Prescriptions   No medications on file  Previous Medications   ALBUTEROL (PROVENTIL) (2.5 MG/3ML) 0.083% NEBULIZER SOLUTION    Take 2.5 mg by nebulization every 4 (four) hours as needed for shortness of breath. Reported on 09/25/2015   ALLOPURINOL (ZYLOPRIM) 100 MG TABLET    Take 2 tablets (200 mg total) by mouth daily.   CARVEDILOL (COREG) 3.125 MG TABLET    Take 3.125 mg by mouth daily.   FLUTICASONE (FLONASE) 50 MCG/ACT NASAL SPRAY    Place 2 sprays into both nostrils daily.   HYDRALAZINE (APRESOLINE) 50 MG TABLET    Take 50 mg by mouth 2 (two) times daily.   HYDROCODONE-ACETAMINOPHEN (NORCO/VICODIN) 5-325 MG TABLET    Take 1-2 tablets by mouth every 4 (four) hours as needed for moderate pain.   HYDROXYCHLOROQUINE (PLAQUENIL) 200 MG TABLET    Take 200 mg by mouth 2 (two) times daily.   MULTIPLE VITAMIN (MULTIVITAMIN WITH MINERALS) TABS TABLET    Take 1 tablet by mouth daily.   PANTOPRAZOLE (PROTONIX) 40 MG TABLET    Take 1 tablet (40 mg total) by mouth 2 (two) times daily.   PROMETHAZINE (PHENERGAN) 25 MG TABLET    Take 1 tablet (25 mg total) by mouth every 6 (six) hours as needed for nausea or vomiting.   PROTEIN (PROCEL 100) POWD    Take 1 scoop by mouth 2 (two) times daily.   SENNA (SENOKOT) 8.6 MG TABS TABLET    Take 1 tablet (8.6 mg total) by mouth daily as needed for mild constipation or moderate constipation.   SEVELAMER CARBONATE (RENVELA) 800 MG TABLET    Take 1 tablet (800 mg total) by mouth 3 (three) times daily with meals.   WARFARIN (COUMADIN) 4 MG TABLET    Take 8 mg by mouth daily. Two 4 mg tablets to = 8 mg  Modified Medications   No medications on file  Discontinued Medications   WARFARIN (COUMADIN) 7.5 MG TABLET    Take 1 tablet (7.5 mg total) by mouth one time only at 6 PM.     Allergies  Allergen Reactions  . Ace Inhibitors Other (See  Comments)    Worsening renal insufficiency     REVIEW OF SYSTEMS:  GENERAL: no change in appetite, no fatigue, no weight changes, no fever, chills or weakness EYES: Denies change in vision, dry eyes, eye pain, itching or discharge EARS: Denies change in hearing, ringing in ears, or earache NOSE: Denies nasal congestion or epistaxis MOUTH and THROAT: Denies oral discomfort, gingival pain or bleeding, pain from teeth or hoarseness   RESPIRATORY: no cough, SOB, DOE, wheezing, hemoptysis CARDIAC: no chest pain, edema or palpitations GI: no abdominal pain, diarrhea, constipation, heart burn,  nausea or vomiting GU: Denies dysuria, frequency, hematuria, incontinence, or discharge PSYCHIATRIC: Denies feeling of depression or anxiety. No report of hallucinations, insomnia, paranoia, or agitation    PHYSICAL EXAMINATION  GENERAL APPEARANCE: Well nourished. In no acute distress. Normal body habitus SKIN:  Skin is warm and dry. Left upper arm AV Fistula and Left IJ dialysis catheter on left chest HEAD: Normal in size and contour. No evidence of trauma EYES: Lids open and close normally. No blepharitis, entropion or ectropion. PERRL. Conjunctivae are clear and sclerae are white. Lenses are without opacity EARS: Pinnae are normal. Patient hears normal voice tunes of the examiner MOUTH and THROAT: Lips are without lesions. Oral mucosa is moist and without lesions. Tongue is normal in shape, size, and color and without lesions NECK: supple, trachea midline, no neck masses, no thyroid tenderness, no thyromegaly LYMPHATICS: no LAN in the neck, no supraclavicular LAN RESPIRATORY: breathing is even & unlabored, BS CTAB CARDIAC: RRR, no murmur,no extra heart sounds, no edema GI: abdomen soft, normal BS, no masses, no tenderness, no hepatomegaly, no splenomegaly EXTREMITIES:  Able to move X 4 extremities PSYCHIATRIC: Alert and oriented X 3. Affect and behavior are appropriate  LABS/RADIOLOGY: Labs  reviewed: Basic Metabolic Panel:  Recent Labs  11/10/15 2144  11/20/15 0823 11/21/15 1409 11/23/15 0532 11/24/15 0808 11/27/15 11/30/15  NA  --   < > 136 135 131* 133* 135* 137  K  --   < > 4.3 3.9 4.3 3.6 4.3 4.0  CL  --   < > 95* 97* 94* 95*  --   --   CO2  --   < > 27 26 25 26   --   --   GLUCOSE  --   < > 90 94 93 118*  --   --   BUN  --   < > 45* 39* 43* 31* 29* 46*  CREATININE  --   < > 6.47* 6.37* 6.30* 5.63* 5.1* 6.6*  CALCIUM  --   < > 9.3 9.2 9.3 9.2  --   --   MG 2.6*  --   --   --   --   --   --   --   PHOS  --   < > 4.7* 3.5  --  4.2  --   --   < > = values in this interval not displayed. Liver Function Tests:  Recent Labs  08/20/15 0500 08/21/15 0400  11/10/15 1550  11/20/15 0823 11/21/15 1409 11/24/15 0808  AST 36 34  --  29  --   --   --   --   ALT 17 17  --  14*  --   --   --   --   ALKPHOS 99 102  --  114  --   --   --   --   BILITOT 0.7 0.8  --  0.7  --   --   --   --   PROT 6.9 7.3  --  7.1  --   --   --   --   ALBUMIN 3.8 3.6  < > 4.0  < > 3.4* 3.1* 3.4*  < > = values in this interval not displayed.  Recent Labs  12/10/14 1840 04/15/15 0853 11/14/15 0625  LIPASE 53* 47 92*  AMYLASE  --   --  268*   CBC:  Recent Labs  11/10/15 1550  11/21/15 1409  11/23/15 0532 11/24/15 0808 11/27/15 11/30/15  WBC  5.9  < > 7.6  --  6.9 9.5 7.0 6.5  NEUTROABS 4.7  --   --   --   --   --  4 4  HGB 6.5*  < > 7.0*  < > 9.0* 8.7* 8.9* 8.7*  HCT 20.8*  < > 23.4*  < > 28.8* 28.2* 30* 29*  MCV 88.1  < > 86.7  --  86.5 86.8  --   --   PLT 239  < > 102*  --  124* 114* 110* 152  < > = values in this interval not displayed.  Lipid Panel:  Recent Labs  03/18/15 0727  HDL 67.40   Cardiac Enzymes:  Recent Labs  08/18/15 0413 08/18/15 1112 11/10/15 1550  TROPONINI 0.08* 0.08* 0.10*     Dg Chest 1 View  11/22/2015  CLINICAL DATA:  Status post right-sided thoracentesis EXAM: CHEST 1 VIEW COMPARISON:  Nov 20, 2015 FINDINGS: There has been interval  resolution of right pleural effusion. No pneumothorax evident. Central catheter tip in superior vena cava. No edema or consolidation. Heart remains enlarged with pulmonary vascularity within normal limits. No adenopathy. IMPRESSION: Stable cardiomegaly. No appreciable edema or consolidation. No pneumothorax. No pleural effusion currently evident. Electronically Signed   By: Lowella Grip III M.D.   On: 11/22/2015 09:45   Dg Chest 2 View  11/20/2015  CLINICAL DATA:  Pleural effusion.  Shortness of breath.  Chest pain. EXAM: CHEST  2 VIEW COMPARISON:  11/12/2015 chest radiograph. FINDINGS: Right internal jugular central venous catheter terminates in the right upper mediastinum, unchanged. Stable cardiomediastinal silhouette with mild cardiomegaly. No pneumothorax. Moderate right pleural effusion is probably unchanged accounting for differences in inspiration. No left pleural effusion. No pulmonary edema. Patchy right lower lung opacity, not appreciably changed. IMPRESSION: 1. Stable mild cardiomegaly without pulmonary edema. 2. Stable moderate right pleural effusion. 3. Stable patchy right lower lung opacity, favor atelectasis. Electronically Signed   By: Ilona Sorrel M.D.   On: 11/20/2015 14:14   Ct Chest Wo Contrast  11/12/2015  CLINICAL DATA:  Shortness of breath. Wheezing. Stage 4 chronic kidney disease. Pleural effusion. EXAM: CT CHEST WITHOUT CONTRAST TECHNIQUE: Multidetector CT imaging of the chest was performed following the standard protocol without IV contrast. COMPARISON:  11/10/2015 and 08/18/2015 FINDINGS: Mediastinum/Nodes: Prominent contour systemic venous structures. Cardiomegaly, especially involving the right atrium. Low-density along the medial margin of the right auricle as on image 64 series 2, I am uncertain whether this represents the dentated margin or a filling defect such as thrombus. Descending aortic aneurysm, 4.7 cm in the posterior arch. Scattered small lymph nodes. Flattened and  nearly occluded appearance of the right mainstem bronchus and bronchus intermedius. There is some loculation of the right pleural effusion along the right anterior mediastinal border. Lungs/Pleura: Large right pleural effusion filling about 70% of the right hemithorax, with associated atelectasis of much of the right lung. Suspected loculated components. There is mild atelectasis in the left lower lobe with airway thickening and near occlusion of left lower lobe segmental bronchi. Upper abdomen: Part of the right posterior thoracic cavity is excluded as it extends well below the left side. Trace perisplenic ascites. Musculoskeletal: Considerable thoracic spondylosis and degenerative disc disease. Bilateral gynecomastia. Diffuse subcutaneous edema. IMPRESSION: 1. Prominent systemic venous structures favoring elevated right heart pressures. Prominent right atrial enlargement and moderate enlargement of the rest of the heart. 2. I am unsure if the low-density posteriorly into the right in the right auricle represents the dentate  margin of the right auricle or thrombus. Consider echocardiography with attention to this region. 3. Large right pleural effusion with extensive passive atelectasis. Loculated component of the pleural effusion anteriorly. 4. Prominent airway thickening and near collapse in the right mainstem bronchus, right bronchus intermedius, and in the left lower lobe bronchi. This raises suspicion for the possibility of bronchomalacia. 5. Diffuse subcutaneous edema and mild perisplenic ascites, likely due to diffuse third spacing of fluid. There is also bilateral gynecomastia. 6. Stable aneurysmal thoracic aorta, measuring up to 4.7 cm along the posterior arch. Electronically Signed   By: Van Clines M.D.   On: 11/12/2015 17:00   Dg Chest Port 1 View  11/23/2015  CLINICAL DATA:  Status post dialysis catheter insertion. EXAM: PORTABLE CHEST 1 VIEW COMPARISON:  Nov 22, 2015. FINDINGS: Stable  cardiomegaly. Interval placement of internal jugular dialysis catheter with distal tip in the expected position of the right atrium. No pneumothorax is noted. Left lung is clear. Mild right basilar opacity is noted most consistent with atelectasis or pleural effusion. Bony thorax is unremarkable. IMPRESSION: Interval placement of left internal jugular dialysis catheter. No pneumothorax is noted. Mild right basilar subsegmental atelectasis or effusion is noted. Electronically Signed   By: Marijo Conception, M.D.   On: 11/23/2015 11:58   Dg Chest Port 1 View  11/12/2015  CLINICAL DATA:  Central line placement EXAM: PORTABLE CHEST 1 VIEW COMPARISON:  Multiple exams, including 11/10/2015 FINDINGS: Right IJ line tip is in the lower internal jugular vein, immediately above the brachiocephalic confluence. If SVC placement is desired consider advancing 3 cm. Stable large right pleural effusion with passive atelectasis. Stable cardiomegaly. IMPRESSION: 1. Right IJ line tip is projects in the lower right internal jugular vein, just above the brachiocephalic confluence. If SVC placement is desired, advanced 3 cm. 2. Stable large right pleural effusion and stable cardiomegaly. Electronically Signed   By: Van Clines M.D.   On: 11/12/2015 18:34   Dg Chest Port 1 View  11/10/2015  CLINICAL DATA:  Short of breath for 4 day EXAM: PORTABLE CHEST 1 VIEW COMPARISON:  08/19/2015 FINDINGS: The heart is moderately enlarged with a globular appearance. Moderate right pleural effusion and associated pulmonary opacity in the right mid and lower lung zones has developed. There is no pneumothorax. There is no pulmonary edema. There is no pneumothorax or vascular congestion. IMPRESSION: Right pleural effusion and basilar pulmonary opacity. Cardiomegaly is noted. Electronically Signed   By: Marybelle Killings M.D.   On: 11/10/2015 16:12   US Thoracentesis Asp Pleural Space W/img Guide  11/22/2015  INDICATION: End-stage renal disease, CHF,  dyspnea, recurrent right pleural effusion. Request made for diagnostic and therapeutic right thoracentesis. EXAM: ULTRASOUND GUIDED DIAGNOSTIC AND THERAPEUTIC RIGHT THORACENTESIS MEDICATIONS: None. COMPLICATIONS: None immediate. PROCEDURE: An ultrasound guided thoracentesis was thoroughly discussed with the patient and questions answered. The benefits, risks, alternatives and complications were also discussed. The patient understands and wishes to proceed with the procedure. Written consent was obtained. Ultrasound was performed to localize and mark an adequate pocket of fluid in the right chest. The area was then prepped and draped in the normal sterile fashion. 1% Lidocaine was used for local anesthesia. Under ultrasound guidance a Safe-T-Centesis catheter was introduced. Thoracentesis was performed. The catheter was removed and a dressing applied. FINDINGS: A total of approximately 1.4 liters of yellow fluid was removed. Samples were sent to the laboratory as requested by the clinical team. IMPRESSION: Successful ultrasound guided diagnostic and therapeutic right thoracentesis yielding  1.4 liters of pleural fluid. Read by: Rowe Robert, PA-C Electronically Signed   By: Aletta Edouard M.D.   On: 11/22/2015 10:13    ASSESSMENT/PLAN:  Physical deconditioning - for Home health PT, OT and skilled Nursing  Acute blood loss anemia and chronic renal failure -  S/P transfusion of 2 units PRBC; hgb 8.7; continue ferric gluconate and aranesp with HD; re-check hgb 8.7, stable  Acute upper GI bleed - S/P EGD on 4/28 was notable for gastritis - continue Protonix 40 mg BID  Chronic atrial fibrillation - rate controlled; continue carvedilol 3.125 mg daily and Coumadin 8 mg daily  ESRD - S/P AV fistula placement 5/8, now on HD TTSat; continue sevelamer carbonate 800 mg 1 tab by mouth 3 times a day with meals  Chronic systolic CHF - EF [id number]; on HD; continue hydralazine and Coreg  Essential hypertension -  continue Coreg 3.125 mg daily, hydralazine 50 mg 1 tab by mouth twice a day; check BMP  Rheumatoid arthritis - continue Plaquenil 200 mg 1 tab by mouth twice a day, Norco 5/325 mg 1-2 tabs by mouth every 4 hours when necessary  Gout - continue allopurinol 100 mg take 2 tabs = 200 mg by mouth daily  Allergic rhinitis - continue Flonase 50 g 2 sprays into both nostrils daily  Constipation - continue senna 8.6 mg 1 tab by mouth daily when necessary      I have filled out patient's discharge paperwork and written prescriptions.  Patient will receive home health PT, OT and skilled Nurse.  DME provided:  3-in-1 bedside commode  Total discharge time: Greater than 30 minutes  Discharge time involved coordination of the discharge process with social worker, nursing staff and therapy department. Medical justification for home health services/DME verified.    Durenda Age, NP [employer] [phone]

## 2015-12-08 DIAGNOSIS — D508 Other iron deficiency anemias: Secondary | ICD-10-CM | POA: Diagnosis not present

## 2015-12-08 DIAGNOSIS — N186 End stage renal disease: Secondary | ICD-10-CM | POA: Diagnosis not present

## 2015-12-10 ENCOUNTER — Other Ambulatory Visit: Payer: Self-pay | Admitting: *Deleted

## 2015-12-10 DIAGNOSIS — D508 Other iron deficiency anemias: Secondary | ICD-10-CM | POA: Diagnosis not present

## 2015-12-10 DIAGNOSIS — N186 End stage renal disease: Secondary | ICD-10-CM | POA: Diagnosis not present

## 2015-12-10 NOTE — Patient Outreach (Signed)
[facility]) Care Management  12/10/2015  DINARI STGERMAINE 1940/11/28 [id number]   CSW drove out to [facility] [place] where patient was residing to receive short-term rehabilitative services, to attend the Discharge Planning Meeting, only to find that patient was no longer there.  According to the receptionist at [facility], patient had been discharged several days ago, returning home to live independently with home health.  CSW tried meeting with Levada Dy, Education officer, museum at [employer], to discuss patient's discharge arrangements; however, she was unavailable.  A HIPAA compliant message was left for patient on voicemail.  CSW is currently awaiting a return call. CSW then contacted patient at home and the call was successful.  Patient admitted that he was discharged earlier in the week.  According to patient, the following home health services were arranged:   Physical therapy for endurance, mobility, safety, strengthening, conditioning, etc. Occupational therapy for education and evaluation of proper functioning in the home environment, as well as assistance with activities of daily living. Nursing for disease management services. DME (Durable Medical Equipment was also arranged for patient in the form of a 3-in-1 bedside commode. Patient denies having any social work needs at present, but is aware that he will be contacted by [name], RNCM with [facility] for transition of care calls.  Patient voiced understanding and was agreeable to this plan. CSW will perform a case closure on patient, as all goals of treatment have been met from social work standpoint and no additional social work needs have been identified at this time. CSW will notify patient's RNCM with [facility], [name] of CSW's plans to close patient's case. CSW will fax a correspondence letter to patient's Primary Care  Physician, Dr. Blanchie Serve to ensure that Dr. Bubba Camp is aware of CSW's case closure plans.   CSW will submit a case closure request to Lurline Del, Care Management Assistant with [facility], in the form of an In [employer].  CSW will ensure that Mrs. Laurance Flatten is aware of [name], RNCM with [facility], continued involvement with patient's care. Nat Christen, BSW, MSW, LCSW  Licensed Education officer, environmental [facility] System  Mailing [place] [address], [place], [place] [address] Physical Address-[address], [place], [place] [address] Toll Free Main # [phone] Fax # [phone] Cell # [phone]  Fax # [phone]  [email or web address] Patient's preferred language:  [place]   English  ATTENTION:  If you speak [place], language assistance services, free of charge, are available to you.    Nondiscrimination and Accessibility Statement: Discrimination is Against the [employer], a subsidiary of [employer], complies with [employer] civil rights laws and does not discriminate on the basis of race, color, national origin, age, disability, or sex.  [place] does not exclude people or treat them differently because of race, color, national origin, age, disability, or sex.  [place] Providers will:  . Provide free aids and services to people with disabilities to communicate effectively with [place], such as:     ? Qualified sign language interpreters  ? Written information in other formats (large print, audio, accessible electronic formats, other formats)   . Provide free language services to people whose primary language is not [place], such as:    ? Qualified interpreters    ? Information written in other languages   If  you need these services, contact your [facility] psychologist.   If you believe that a [facility] [employer] has failed to provide these services or discriminated in another way on the basis of race, color, national origin, age, disability, or sex, you can file a Tourist information centre manager with: [place], [phone] or [email or web address].info/.  You can file a grievance in person or by mail, fax, or email. If you need help filing a grievance, you may contact Valrie Hart, Interim Compliance Officer, [facility] Department of Compliance and Integrity, [place], [place], [place]. [place], [phone], [email or web address].    You can also file a civil rights complaint with the U.S. Department of Health and Financial controller, Office for [employer], electronically through the Office for Civil Rights Complaint Portal, available at OnSiteLending.nl.jsf, or by mail or phone at:  [place]. Department of Health and Human Services [address] [place] Room [phone], [facility] Building [place], [place]  [phone], [phone] (TDD) Complaint forms are available at CutFunds.si.

## 2015-12-12 DIAGNOSIS — N186 End stage renal disease: Secondary | ICD-10-CM | POA: Diagnosis not present

## 2015-12-12 DIAGNOSIS — D508 Other iron deficiency anemias: Secondary | ICD-10-CM | POA: Diagnosis not present

## 2015-12-14 DIAGNOSIS — E1122 Type 2 diabetes mellitus with diabetic chronic kidney disease: Secondary | ICD-10-CM | POA: Diagnosis not present

## 2015-12-14 DIAGNOSIS — N184 Chronic kidney disease, stage 4 (severe): Secondary | ICD-10-CM | POA: Diagnosis not present

## 2015-12-14 DIAGNOSIS — R262 Difficulty in walking, not elsewhere classified: Secondary | ICD-10-CM | POA: Diagnosis not present

## 2015-12-14 DIAGNOSIS — Z96642 Presence of left artificial hip joint: Secondary | ICD-10-CM | POA: Diagnosis not present

## 2015-12-14 DIAGNOSIS — I130 Hypertensive heart and chronic kidney disease with heart failure and stage 1 through stage 4 chronic kidney disease, or unspecified chronic kidney disease: Secondary | ICD-10-CM | POA: Diagnosis not present

## 2015-12-14 DIAGNOSIS — I482 Chronic atrial fibrillation: Secondary | ICD-10-CM | POA: Diagnosis not present

## 2015-12-14 DIAGNOSIS — I5042 Chronic combined systolic (congestive) and diastolic (congestive) heart failure: Secondary | ICD-10-CM | POA: Diagnosis not present

## 2015-12-14 DIAGNOSIS — M069 Rheumatoid arthritis, unspecified: Secondary | ICD-10-CM | POA: Diagnosis not present

## 2015-12-14 DIAGNOSIS — M109 Gout, unspecified: Secondary | ICD-10-CM | POA: Diagnosis not present

## 2015-12-14 DIAGNOSIS — Z96651 Presence of right artificial knee joint: Secondary | ICD-10-CM | POA: Diagnosis not present

## 2015-12-14 DIAGNOSIS — Z7901 Long term (current) use of anticoagulants: Secondary | ICD-10-CM | POA: Diagnosis not present

## 2015-12-14 DIAGNOSIS — Z9181 History of falling: Secondary | ICD-10-CM | POA: Diagnosis not present

## 2015-12-15 ENCOUNTER — Other Ambulatory Visit: Payer: Self-pay | Admitting: *Deleted

## 2015-12-15 DIAGNOSIS — N186 End stage renal disease: Secondary | ICD-10-CM | POA: Diagnosis not present

## 2015-12-15 DIAGNOSIS — D508 Other iron deficiency anemias: Secondary | ICD-10-CM | POA: Diagnosis not present

## 2015-12-15 NOTE — Patient Outreach (Signed)
[facility]) Care Management  12/15/2015  Bruce Cole 1941/01/03 [id number]   Transition of care/post SNF discharge  RN attempted outreach call today to pt however unsuccessful. RN able to leave a HIPAA approved voice message requesting a cal back. Will continue to follow up accordingly.  [name], RN Care Management Coordinator [place] Office [phone]

## 2015-12-16 ENCOUNTER — Ambulatory Visit (INDEPENDENT_AMBULATORY_CARE_PROVIDER_SITE_OTHER): Payer: Medicare Other | Admitting: *Deleted

## 2015-12-16 ENCOUNTER — Encounter: Payer: Self-pay | Admitting: Family

## 2015-12-16 ENCOUNTER — Telehealth: Payer: Self-pay | Admitting: *Deleted

## 2015-12-16 ENCOUNTER — Ambulatory Visit (INDEPENDENT_AMBULATORY_CARE_PROVIDER_SITE_OTHER): Payer: Medicare Other | Admitting: Family

## 2015-12-16 VITALS — BP 112/52 | HR 60 | Temp 98.8°F | Resp 18 | Ht 75.0 in | Wt 224.0 lb

## 2015-12-16 DIAGNOSIS — D62 Acute posthemorrhagic anemia: Secondary | ICD-10-CM | POA: Diagnosis not present

## 2015-12-16 DIAGNOSIS — N186 End stage renal disease: Secondary | ICD-10-CM | POA: Diagnosis not present

## 2015-12-16 DIAGNOSIS — J948 Other specified pleural conditions: Secondary | ICD-10-CM

## 2015-12-16 DIAGNOSIS — I482 Chronic atrial fibrillation, unspecified: Secondary | ICD-10-CM

## 2015-12-16 DIAGNOSIS — I4891 Unspecified atrial fibrillation: Secondary | ICD-10-CM | POA: Diagnosis not present

## 2015-12-16 DIAGNOSIS — I5042 Chronic combined systolic (congestive) and diastolic (congestive) heart failure: Secondary | ICD-10-CM | POA: Diagnosis not present

## 2015-12-16 DIAGNOSIS — M069 Rheumatoid arthritis, unspecified: Secondary | ICD-10-CM | POA: Diagnosis not present

## 2015-12-16 DIAGNOSIS — M1610 Unilateral primary osteoarthritis, unspecified hip: Secondary | ICD-10-CM | POA: Diagnosis not present

## 2015-12-16 DIAGNOSIS — N184 Chronic kidney disease, stage 4 (severe): Secondary | ICD-10-CM | POA: Diagnosis not present

## 2015-12-16 DIAGNOSIS — Z7901 Long term (current) use of anticoagulants: Secondary | ICD-10-CM | POA: Diagnosis not present

## 2015-12-16 DIAGNOSIS — Z5181 Encounter for therapeutic drug level monitoring: Secondary | ICD-10-CM

## 2015-12-16 DIAGNOSIS — I158 Other secondary hypertension: Secondary | ICD-10-CM | POA: Diagnosis not present

## 2015-12-16 DIAGNOSIS — E1122 Type 2 diabetes mellitus with diabetic chronic kidney disease: Secondary | ICD-10-CM | POA: Diagnosis not present

## 2015-12-16 DIAGNOSIS — J90 Pleural effusion, not elsewhere classified: Secondary | ICD-10-CM

## 2015-12-16 DIAGNOSIS — Z992 Dependence on renal dialysis: Secondary | ICD-10-CM

## 2015-12-16 DIAGNOSIS — I130 Hypertensive heart and chronic kidney disease with heart failure and stage 1 through stage 4 chronic kidney disease, or unspecified chronic kidney disease: Secondary | ICD-10-CM | POA: Diagnosis not present

## 2015-12-16 LAB — POCT INR: INR: 2.7

## 2015-12-16 NOTE — Telephone Encounter (Signed)
Forwarded to [employer]. JG//CMA

## 2015-12-16 NOTE — Progress Notes (Signed)
Subjective:    Patient ID: Bruce Cole, male    DOB: July 22, 1940, 75 y.o.   MRN: [name]  HPI  [name] is a 75 yr old male who presents today for hospital and SNF follow up. Records are reviewed. He was hospitalized 11/10/15-11/25/15. During that admit he was found to be anemic (with supratherapeutic INR), volume overloaded with a right pleural effusion (s/p thoracentesis). EGD performed during hospitalization showed gastrititis.  He received several unites of PRBC during hospitalization.  He had worsening renal function and HD was intiated. He is on Tues/thurs/Sat HD. Reports feeling much better since his hospitalization. Feels like he is getting stronger.  He continues PT and is walking with a walker.  He is able to drive himself to/from HD.    The patient is hoping to have his hip replaced in the near future as this is impacting his mobility and causing him a great deal of pain.  Review of Systems Denies black/bloody stools.   Past Medical History  Diagnosis Date  . Nephrolithiasis   . Rheumatoid arthritis ([place])   . Colon polyp 2000  . Atrial fibrillation (HCC)     Chronic  . Unspecified essential hypertension   . Gout   . CHF (congestive heart failure) ([place])   . Symptomatic anemia   . Diabetes mellitus type 2, controlled, with complications ([place])   . CKD (chronic kidney disease), stage IV ([place])   . Chronic combined systolic and diastolic heart failure ([place])   . Acute renal failure superimposed on stage 3 chronic kidney disease ([place])   . AKI (acute kidney injury) ([place])   . Cardiorenal disease   . GI bleed   . Supratherapeutic INR   . Heme positive stool   . Coagulopathy ([place])   . Gastritis and gastroduodenitis   . Pleural effusion   . Tachypnea   . Acute blood loss anemia   . Primary osteoarthritis of right hip   . Thrombocytopenia ([place])   . S/P dialysis catheter insertion ([place])   . ESRD on dialysis ([place]) 11/27/2015  . DM (diabetes mellitus), type 2 with renal  complications ([place]) [id number]    Qualifier: Diagnosis of  By: Inda Castle FNP, Rona Tomson S   . OSA (obstructive sleep apnea) 09/02/2013     IMPRESSION :  1. Mild obstructive sleep apnea with hypopneas causing sleep fragmentation and moderate oxygen desaturation.  2. Short runs of nonsustained VT were noted. His beta blocker may need to be titrated 3. Significant PLM's were noted, the PLM arousal index was low. Please correlate with clinical history of restless leg syndrome.  4. Sleep efficiency was poor.  RECOMMENDATION:  1. Treatment options for this degree of sleep disordered breathing include weight loss and positional therapy to avoid supine sleep 2. Consider titrating beta blocker further, defer to cardiologist 3. Patient should be cautioned against driving when sleepy.They should be asked to avoid medications with sedative side effects     . Pleural effusion on right      Social History   Social History  . Marital Status: Single    Spouse Name: N/A  . Number of Children: 3  . Years of Education: N/A   Occupational History  . retired Programmer, multimedia     Social History Main Topics  . Smoking status: Never Smoker   . Smokeless tobacco: Never Used  . Alcohol Use: Yes     Comment: occasional  . Drug Use: No  . Sexual Activity: Not on file  Other Topics Concern  . Not on file   Social History Narrative   Former [employer]   Admitted to [facility] 11/25/15   Widowed   Never smoked   FULL CODE    Past Surgical History  Procedure Laterality Date  . Spine surgery      x 2  . Joint replacement      Total L-Hip replacement, Right Knee 10/20/09  . Cholecystectomy  1994  . Left and right heart catheterization with coronary angiogram N/A 02/22/2013    Procedure: LEFT AND RIGHT HEART CATHETERIZATION WITH CORONARY ANGIOGRAM;  Surgeon: Jolaine Artist, MD;  Location: [facility] CATH LAB;  Service: Cardiovascular;  Laterality: N/A;  . Av fistula placement Left  08/26/2015    Procedure: LEFT RADIOCEPHALIC FISTULA CREATION;  Surgeon: Rosetta Posner, MD;  Location: [facility] OR;  Service: Vascular;  Laterality: Left;  . Esophagogastroduodenoscopy N/A 11/13/2015    Procedure: ESOPHAGOGASTRODUODENOSCOPY (EGD);  Surgeon: Irene Shipper, MD;  Location: [facility] ENDOSCOPY;  Service: Endoscopy;  Laterality: N/A;  . Peripheral vascular catheterization Left 11/19/2015    Procedure: A/V/Fistulagram;  Surgeon: Conrad Crystal, MD;  Location: [facility] CV LAB;  Service: Cardiovascular;  Laterality: Left;  . Av fistula placement Left 11/23/2015    Procedure: ARTERIOVENOUS (AV) FISTULA CREATION;  Surgeon: Rosetta Posner, MD;  Location: [facility];  Service: Vascular;  Laterality: Left;  . Insertion of dialysis catheter Left 11/23/2015    Procedure: INSERTION OF DIALYSIS CATHETER;  Surgeon: Rosetta Posner, MD;  Location: [facility] OR;  Service: Vascular;  Laterality: Left;    Family History  Problem Relation Age of Onset  . Hypertension Mother   . Arthritis Mother     ?RA  . Hypertension Father     Allergies  Allergen Reactions  . Ace Inhibitors Other (See Comments)    Worsening renal insufficiency    Current Outpatient Prescriptions on File Prior to Visit  Medication Sig Dispense Refill  . albuterol (PROVENTIL) (2.5 MG/3ML) 0.083% nebulizer solution Take 2.5 mg by nebulization every 4 (four) hours as needed for shortness of breath. Reported on 09/25/2015    . allopurinol (ZYLOPRIM) 100 MG tablet Take 2 tablets (200 mg total) by mouth daily. 30 tablet 0  . carvedilol (COREG) 3.125 MG tablet Take 3.125 mg by mouth daily.    . fluticasone (FLONASE) 50 MCG/ACT nasal spray Place 2 sprays into both nostrils daily. 16 g 6  . HYDROcodone-acetaminophen (NORCO/VICODIN) 5-325 MG tablet Take 1-2 tablets by mouth every 4 (four) hours as needed for moderate pain. 30 tablet 0  . hydroxychloroquine (PLAQUENIL) 200 MG tablet Take 200 mg by mouth 2 (two) times daily.    . Multiple Vitamin (MULTIVITAMIN WITH  MINERALS) TABS tablet Take 1 tablet by mouth daily.    . pantoprazole (PROTONIX) 40 MG tablet Take 1 tablet (40 mg total) by mouth 2 (two) times daily. 60 tablet 0  . promethazine (PHENERGAN) 25 MG tablet Take 1 tablet (25 mg total) by mouth every 6 (six) hours as needed for nausea or vomiting. 30 tablet 0  . Protein (PROCEL 100) POWD Take 1 scoop by mouth 2 (two) times daily.    [name] senna (SENOKOT) 8.6 MG TABS tablet Take 1 tablet (8.6 mg total) by mouth daily as needed for mild constipation or moderate constipation. 120 each 0  . sevelamer carbonate (RENVELA) 800 MG tablet Take 1 tablet (800 mg total) by mouth 3 (three) times daily with meals. 90 tablet  0  . warfarin (COUMADIN) 4 MG tablet Take 8 mg by mouth daily. Two 4 mg tablets to = 8 mg     No current facility-administered medications on file prior to visit.    BP 112/52 mmHg  Pulse 60  Temp(Src) 98.8 F (37.1 C) (Oral)  Resp 18  Ht 6\' 3"  (1.905 m)  Wt 224 lb (101.606 kg)  BMI 28.00 kg/m2  SpO2 98%       Objective:   Physical Exam  Constitutional: He is oriented to person, place, and time. He appears well-developed and well-nourished. No distress.  HENT:  Head: Normocephalic and atraumatic.  Cardiovascular: Normal rate and regular rhythm.   No murmur heard. Pulmonary/Chest: Effort normal and breath sounds normal. No respiratory distress. He has no wheezes. He has no rales.  Musculoskeletal: He exhibits no edema.  Neurological: He is alert and oriented to person, place, and time.  Skin: Skin is warm and dry.  Psychiatric: He has a normal mood and affect. His behavior is normal. Thought content normal.          Assessment & Plan:

## 2015-12-16 NOTE — Progress Notes (Signed)
Pre visit review using our clinic review tool, if applicable. No additional management support is needed unless otherwise documented below in the visit note. 

## 2015-12-17 ENCOUNTER — Other Ambulatory Visit: Payer: Self-pay | Admitting: *Deleted

## 2015-12-17 DIAGNOSIS — N2581 Secondary hyperparathyroidism of renal origin: Secondary | ICD-10-CM | POA: Diagnosis not present

## 2015-12-17 DIAGNOSIS — I4891 Unspecified atrial fibrillation: Secondary | ICD-10-CM | POA: Diagnosis not present

## 2015-12-17 DIAGNOSIS — D508 Other iron deficiency anemias: Secondary | ICD-10-CM | POA: Diagnosis not present

## 2015-12-17 DIAGNOSIS — N186 End stage renal disease: Secondary | ICD-10-CM | POA: Diagnosis not present

## 2015-12-17 NOTE — Assessment & Plan Note (Signed)
Follow up H/H stable. He is having no clinical symptoms of active bleed.

## 2015-12-17 NOTE — Assessment & Plan Note (Signed)
Clinically resolved following thoracentesis and volume optimization following initiation of HD.

## 2015-12-17 NOTE — Assessment & Plan Note (Signed)
Feeling much better since he started HD.  He is down about 50 pounds since I last saw him in April, most of this weight I believe was fluid.  Management per Nephrology.

## 2015-12-17 NOTE — Patient Outreach (Signed)
[place] [facility]) Care Management  12/17/2015  ALPHONZA FELTHAM Jun 24, 1941 [name]   Transition of care  RN attempted second outreach call to pt however unsuccessful. RN able to leave HIPAA approved voice message requesting a call back. Will inquire further on pt's needs and introduce available resources as needed.  [name], RN Care Management Coordinator [place] Office [phone]

## 2015-12-17 NOTE — Assessment & Plan Note (Signed)
Management per ortho. 

## 2015-12-18 ENCOUNTER — Ambulatory Visit (HOSPITAL_COMMUNITY)
Admission: RE | Admit: 2015-12-18 | Discharge: 2015-12-18 | Disposition: A | Discharge status: Home / Self Care | Payer: Medicare Other | Source: Ambulatory Visit | Attending: Vascular Surgery | Admitting: Vascular Surgery

## 2015-12-18 DIAGNOSIS — I482 Chronic atrial fibrillation: Secondary | ICD-10-CM | POA: Diagnosis not present

## 2015-12-18 DIAGNOSIS — N184 Chronic kidney disease, stage 4 (severe): Secondary | ICD-10-CM | POA: Diagnosis not present

## 2015-12-18 DIAGNOSIS — I132 Hypertensive heart and chronic kidney disease with heart failure and with stage 5 chronic kidney disease, or end stage renal disease: Secondary | ICD-10-CM | POA: Insufficient documentation

## 2015-12-18 DIAGNOSIS — I509 Heart failure, unspecified: Secondary | ICD-10-CM | POA: Insufficient documentation

## 2015-12-18 DIAGNOSIS — Z48812 Encounter for surgical aftercare following surgery on the circulatory system: Secondary | ICD-10-CM | POA: Diagnosis not present

## 2015-12-18 DIAGNOSIS — I130 Hypertensive heart and chronic kidney disease with heart failure and stage 1 through stage 4 chronic kidney disease, or unspecified chronic kidney disease: Secondary | ICD-10-CM | POA: Diagnosis not present

## 2015-12-18 DIAGNOSIS — M069 Rheumatoid arthritis, unspecified: Secondary | ICD-10-CM | POA: Diagnosis not present

## 2015-12-18 DIAGNOSIS — E1122 Type 2 diabetes mellitus with diabetic chronic kidney disease: Secondary | ICD-10-CM | POA: Insufficient documentation

## 2015-12-18 DIAGNOSIS — G4733 Obstructive sleep apnea (adult) (pediatric): Secondary | ICD-10-CM | POA: Diagnosis not present

## 2015-12-18 DIAGNOSIS — N186 End stage renal disease: Secondary | ICD-10-CM | POA: Diagnosis not present

## 2015-12-18 DIAGNOSIS — I5042 Chronic combined systolic (congestive) and diastolic (congestive) heart failure: Secondary | ICD-10-CM | POA: Diagnosis not present

## 2015-12-19 DIAGNOSIS — N186 End stage renal disease: Secondary | ICD-10-CM | POA: Diagnosis not present

## 2015-12-19 DIAGNOSIS — N2581 Secondary hyperparathyroidism of renal origin: Secondary | ICD-10-CM | POA: Diagnosis not present

## 2015-12-19 DIAGNOSIS — I4891 Unspecified atrial fibrillation: Secondary | ICD-10-CM | POA: Diagnosis not present

## 2015-12-19 DIAGNOSIS — D508 Other iron deficiency anemias: Secondary | ICD-10-CM | POA: Diagnosis not present

## 2015-12-21 DIAGNOSIS — N184 Chronic kidney disease, stage 4 (severe): Secondary | ICD-10-CM | POA: Diagnosis not present

## 2015-12-21 DIAGNOSIS — I130 Hypertensive heart and chronic kidney disease with heart failure and stage 1 through stage 4 chronic kidney disease, or unspecified chronic kidney disease: Secondary | ICD-10-CM | POA: Diagnosis not present

## 2015-12-21 DIAGNOSIS — E1122 Type 2 diabetes mellitus with diabetic chronic kidney disease: Secondary | ICD-10-CM | POA: Diagnosis not present

## 2015-12-21 DIAGNOSIS — I739 Peripheral vascular disease, unspecified: Secondary | ICD-10-CM | POA: Diagnosis not present

## 2015-12-21 DIAGNOSIS — M069 Rheumatoid arthritis, unspecified: Secondary | ICD-10-CM | POA: Diagnosis not present

## 2015-12-21 DIAGNOSIS — L603 Nail dystrophy: Secondary | ICD-10-CM | POA: Diagnosis not present

## 2015-12-21 DIAGNOSIS — I482 Chronic atrial fibrillation: Secondary | ICD-10-CM | POA: Diagnosis not present

## 2015-12-21 DIAGNOSIS — I5042 Chronic combined systolic (congestive) and diastolic (congestive) heart failure: Secondary | ICD-10-CM | POA: Diagnosis not present

## 2015-12-22 ENCOUNTER — Encounter: Payer: Self-pay | Admitting: *Deleted

## 2015-12-22 ENCOUNTER — Other Ambulatory Visit: Payer: Self-pay | Admitting: *Deleted

## 2015-12-22 DIAGNOSIS — N186 End stage renal disease: Secondary | ICD-10-CM | POA: Diagnosis not present

## 2015-12-22 DIAGNOSIS — D508 Other iron deficiency anemias: Secondary | ICD-10-CM | POA: Diagnosis not present

## 2015-12-22 DIAGNOSIS — I4891 Unspecified atrial fibrillation: Secondary | ICD-10-CM | POA: Diagnosis not present

## 2015-12-22 DIAGNOSIS — N2581 Secondary hyperparathyroidism of renal origin: Secondary | ICD-10-CM | POA: Diagnosis not present

## 2015-12-22 NOTE — Patient Outreach (Signed)
[name] Care Management  12/22/2015  Bruce Cole [birth year] [id number]   Transition of care via discharge SNF  RN has attempted several outreach calls with no response to HIPAA approved voice message left to pt's residential line. Will send outreach letter and await the allow time for pt to respond. If no response will close this case accordingly to policy.  [name], RN Care Management Coordinator [place] Office [phone]

## 2015-12-24 ENCOUNTER — Encounter: Payer: Self-pay | Admitting: Vascular Surgery

## 2015-12-24 DIAGNOSIS — D508 Other iron deficiency anemias: Secondary | ICD-10-CM | POA: Diagnosis not present

## 2015-12-24 DIAGNOSIS — N186 End stage renal disease: Secondary | ICD-10-CM | POA: Diagnosis not present

## 2015-12-24 DIAGNOSIS — N2581 Secondary hyperparathyroidism of renal origin: Secondary | ICD-10-CM | POA: Diagnosis not present

## 2015-12-24 DIAGNOSIS — I4891 Unspecified atrial fibrillation: Secondary | ICD-10-CM | POA: Diagnosis not present

## 2015-12-26 DIAGNOSIS — N186 End stage renal disease: Secondary | ICD-10-CM | POA: Diagnosis not present

## 2015-12-26 DIAGNOSIS — N185 Chronic kidney disease, stage 5: Secondary | ICD-10-CM | POA: Diagnosis not present

## 2015-12-29 ENCOUNTER — Encounter: Payer: Self-pay | Admitting: Vascular Surgery

## 2015-12-29 ENCOUNTER — Ambulatory Visit (INDEPENDENT_AMBULATORY_CARE_PROVIDER_SITE_OTHER): Payer: Medicare Other | Admitting: Vascular Surgery

## 2015-12-29 VITALS — BP 112/64 | HR 51 | Ht 75.0 in | Wt 232.0 lb

## 2015-12-29 DIAGNOSIS — N186 End stage renal disease: Secondary | ICD-10-CM | POA: Diagnosis not present

## 2015-12-29 DIAGNOSIS — I4891 Unspecified atrial fibrillation: Secondary | ICD-10-CM | POA: Diagnosis not present

## 2015-12-29 DIAGNOSIS — N2581 Secondary hyperparathyroidism of renal origin: Secondary | ICD-10-CM | POA: Diagnosis not present

## 2015-12-29 DIAGNOSIS — D508 Other iron deficiency anemias: Secondary | ICD-10-CM | POA: Diagnosis not present

## 2015-12-29 DIAGNOSIS — Z992 Dependence on renal dialysis: Secondary | ICD-10-CM

## 2015-12-29 NOTE — Progress Notes (Signed)
   Patient name: Bruce Cole MRN: [name] DOB: 07-20-1940 Sex: male  REASON FOR VISIT: Here today for follow-up of left IJ hemodialysis catheter placement and left basilic vein transposition fistula   HPI: Bruce Cole is a 75 y.o. male today for follow-up. Had this surgery on 11/23/2015. He has had no issues regarding his catheter for hemodialysis use.  Current Outpatient Prescriptions  Medication Sig Dispense Refill  . albuterol (PROVENTIL) (2.5 MG/3ML) 0.083% nebulizer solution Take 2.5 mg by nebulization every 4 (four) hours as needed for shortness of breath. Reported on 09/25/2015    . allopurinol (ZYLOPRIM) 100 MG tablet Take 2 tablets (200 mg total) by mouth daily. 30 tablet 0  . carvedilol (COREG) 3.125 MG tablet Take 3.125 mg by mouth daily.    . fluticasone (FLONASE) 50 MCG/ACT nasal spray Place 2 sprays into both nostrils daily. 16 g 6  . HYDROcodone-acetaminophen (NORCO/VICODIN) 5-325 MG tablet Take 1-2 tablets by mouth every 4 (four) hours as needed for moderate pain. 30 tablet 0  . hydroxychloroquine (PLAQUENIL) 200 MG tablet Take 200 mg by mouth 2 (two) times daily.    . Multiple Vitamin (MULTIVITAMIN WITH MINERALS) TABS tablet Take 1 tablet by mouth daily.    . pantoprazole (PROTONIX) 40 MG tablet Take 1 tablet (40 mg total) by mouth 2 (two) times daily. 60 tablet 0  . promethazine (PHENERGAN) 25 MG tablet Take 1 tablet (25 mg total) by mouth every 6 (six) hours as needed for nausea or vomiting. 30 tablet 0  . Protein (PROCEL 100) POWD Take 1 scoop by mouth 2 (two) times daily.    [name] senna (SENOKOT) 8.6 MG TABS tablet Take 1 tablet (8.6 mg total) by mouth daily as needed for mild constipation or moderate constipation. 120 each 0  . sevelamer carbonate (RENVELA) 800 MG tablet Take 1 tablet (800 mg total) by mouth 3 (three) times daily with meals. 90 tablet 0  . warfarin (COUMADIN) 4 MG tablet Take 8 mg by mouth daily. Two 4 mg tablets to  = 8 mg    . warfarin (COUMADIN) 7.5 MG tablet TAKE 1 TABLET BY MOUTH DAILY AS DIRECTED BY COUMADIN CLINIC 40 tablet 1   No current facility-administered medications for this visit.      PHYSICAL EXAM: Filed Vitals:   12/29/15 0856  BP: 112/64  Pulse: 51  Height: 6\' 3"  (1.905 m)  Weight: 232 lb (105.235 kg)  SpO2: 100%    GENERAL: The patient is a well-nourished male, in no acute distress. The vital signs are documented above. His left IJ catheter has no evidence of erythema. He did have a nylon suture at the entry site and this was removed. Left basilic vein transposition fistula looks quite good. He has very nice size maturation with an excellent thrill throughout its course  MEDICAL ISSUES: Table status post left upper arm basilic vein transposition fistula on 11/23/2015. He will continue use of his catheter for a total of 3 months out from his fistula creation. Should be able to transition to his fistula and early August. He will follow-up with [place] on as-needed basis  Rosetta Posner, MD [facility] Tel [phone] Pager [phone]

## 2015-12-30 ENCOUNTER — Ambulatory Visit (INDEPENDENT_AMBULATORY_CARE_PROVIDER_SITE_OTHER): Payer: Medicare Other | Admitting: *Deleted

## 2015-12-30 DIAGNOSIS — Z7901 Long term (current) use of anticoagulants: Secondary | ICD-10-CM | POA: Diagnosis not present

## 2015-12-30 DIAGNOSIS — I4891 Unspecified atrial fibrillation: Secondary | ICD-10-CM

## 2015-12-30 DIAGNOSIS — I482 Chronic atrial fibrillation, unspecified: Secondary | ICD-10-CM

## 2015-12-30 DIAGNOSIS — Z5181 Encounter for therapeutic drug level monitoring: Secondary | ICD-10-CM

## 2015-12-30 LAB — POCT INR: INR: 2.3

## 2015-12-31 DIAGNOSIS — N2581 Secondary hyperparathyroidism of renal origin: Secondary | ICD-10-CM | POA: Diagnosis not present

## 2015-12-31 DIAGNOSIS — I4891 Unspecified atrial fibrillation: Secondary | ICD-10-CM | POA: Diagnosis not present

## 2015-12-31 DIAGNOSIS — N186 End stage renal disease: Secondary | ICD-10-CM | POA: Diagnosis not present

## 2015-12-31 DIAGNOSIS — D508 Other iron deficiency anemias: Secondary | ICD-10-CM | POA: Diagnosis not present

## 2016-01-02 DIAGNOSIS — N2581 Secondary hyperparathyroidism of renal origin: Secondary | ICD-10-CM | POA: Diagnosis not present

## 2016-01-02 DIAGNOSIS — N186 End stage renal disease: Secondary | ICD-10-CM | POA: Diagnosis not present

## 2016-01-02 DIAGNOSIS — D508 Other iron deficiency anemias: Secondary | ICD-10-CM | POA: Diagnosis not present

## 2016-01-02 DIAGNOSIS — I4891 Unspecified atrial fibrillation: Secondary | ICD-10-CM | POA: Diagnosis not present

## 2016-01-04 ENCOUNTER — Other Ambulatory Visit: Payer: Self-pay | Admitting: Adult Health

## 2016-01-05 ENCOUNTER — Encounter: Payer: Self-pay | Admitting: *Deleted

## 2016-01-05 ENCOUNTER — Other Ambulatory Visit: Payer: Self-pay | Admitting: *Deleted

## 2016-01-05 DIAGNOSIS — D508 Other iron deficiency anemias: Secondary | ICD-10-CM | POA: Diagnosis not present

## 2016-01-05 DIAGNOSIS — N186 End stage renal disease: Secondary | ICD-10-CM | POA: Diagnosis not present

## 2016-01-05 DIAGNOSIS — N2581 Secondary hyperparathyroidism of renal origin: Secondary | ICD-10-CM | POA: Diagnosis not present

## 2016-01-05 DIAGNOSIS — I4891 Unspecified atrial fibrillation: Secondary | ICD-10-CM | POA: Diagnosis not present

## 2016-01-05 LAB — PROTIME-INR: INR: 3.1 — AB (ref 0.9–1.1)

## 2016-01-05 NOTE — Patient Outreach (Signed)
[place] [facility]) Care Management  01/05/2016  Bruce Cole [birth year] [name]   Several attempts to reach this pt however unsuccessful. Outreach letter sent to pt with no response. Will close case and notify primary provider.   [name], RN Care Management Coordinator [place] Office [phone]

## 2016-01-07 DIAGNOSIS — D508 Other iron deficiency anemias: Secondary | ICD-10-CM | POA: Diagnosis not present

## 2016-01-07 DIAGNOSIS — N2581 Secondary hyperparathyroidism of renal origin: Secondary | ICD-10-CM | POA: Diagnosis not present

## 2016-01-07 DIAGNOSIS — I4891 Unspecified atrial fibrillation: Secondary | ICD-10-CM | POA: Diagnosis not present

## 2016-01-07 DIAGNOSIS — N186 End stage renal disease: Secondary | ICD-10-CM | POA: Diagnosis not present

## 2016-01-09 DIAGNOSIS — D508 Other iron deficiency anemias: Secondary | ICD-10-CM | POA: Diagnosis not present

## 2016-01-09 DIAGNOSIS — N186 End stage renal disease: Secondary | ICD-10-CM | POA: Diagnosis not present

## 2016-01-09 DIAGNOSIS — N2581 Secondary hyperparathyroidism of renal origin: Secondary | ICD-10-CM | POA: Diagnosis not present

## 2016-01-09 DIAGNOSIS — I4891 Unspecified atrial fibrillation: Secondary | ICD-10-CM | POA: Diagnosis not present

## 2016-01-11 ENCOUNTER — Telehealth: Payer: Self-pay | Admitting: Family

## 2016-01-11 NOTE — Telephone Encounter (Signed)
I received INR result from [facility] lab which was drawn by Dr. Mercy Moore at HD.  I will defer dose changes to you since you are managing coumadin.  INR is 3.08 and was drawn on 01/05/16. Thanks.

## 2016-01-11 NOTE — Telephone Encounter (Signed)
INR from 6 days ago - too old to dose. He also has his INRs checked in clinic rather than HD. He was therapeutic when he came in 2 weeks ago and has another clinic INR check scheduled in 2 weeks. Pt to continue current dose until seen again in clinic. Pt aware.

## 2016-01-12 DIAGNOSIS — D508 Other iron deficiency anemias: Secondary | ICD-10-CM | POA: Diagnosis not present

## 2016-01-12 DIAGNOSIS — N2581 Secondary hyperparathyroidism of renal origin: Secondary | ICD-10-CM | POA: Diagnosis not present

## 2016-01-12 DIAGNOSIS — I4891 Unspecified atrial fibrillation: Secondary | ICD-10-CM | POA: Diagnosis not present

## 2016-01-12 DIAGNOSIS — N186 End stage renal disease: Secondary | ICD-10-CM | POA: Diagnosis not present

## 2016-01-13 ENCOUNTER — Encounter (HOSPITAL_COMMUNITY): Payer: Self-pay

## 2016-01-13 ENCOUNTER — Ambulatory Visit (HOSPITAL_COMMUNITY)
Admission: RE | Admit: 2016-01-13 | Discharge: 2016-01-13 | Disposition: A | Discharge status: Home / Self Care | Payer: Medicare Other | Source: Ambulatory Visit | Attending: Internal Medicine | Admitting: Internal Medicine

## 2016-01-13 VITALS — BP 110/58 | HR 56 | Wt 227.8 lb

## 2016-01-13 DIAGNOSIS — M1610 Unilateral primary osteoarthritis, unspecified hip: Secondary | ICD-10-CM | POA: Diagnosis not present

## 2016-01-13 DIAGNOSIS — Z992 Dependence on renal dialysis: Secondary | ICD-10-CM | POA: Insufficient documentation

## 2016-01-13 DIAGNOSIS — Z01810 Encounter for preprocedural cardiovascular examination: Secondary | ICD-10-CM

## 2016-01-13 DIAGNOSIS — G4733 Obstructive sleep apnea (adult) (pediatric): Secondary | ICD-10-CM | POA: Insufficient documentation

## 2016-01-13 DIAGNOSIS — I5042 Chronic combined systolic (congestive) and diastolic (congestive) heart failure: Secondary | ICD-10-CM | POA: Diagnosis not present

## 2016-01-13 DIAGNOSIS — N186 End stage renal disease: Secondary | ICD-10-CM

## 2016-01-13 DIAGNOSIS — I482 Chronic atrial fibrillation, unspecified: Secondary | ICD-10-CM

## 2016-01-13 DIAGNOSIS — Z7901 Long term (current) use of anticoagulants: Secondary | ICD-10-CM | POA: Insufficient documentation

## 2016-01-13 NOTE — Progress Notes (Signed)
Patient ID: Bruce Cole, male   DOB: 1941/06/05, 75 y.o.   MRN: [name]    Advanced Heart Failure Clinic Note   Primary HF: Dr. Haroldine Laws  Nephrology: Dr [name]  HPI: Bruce Cole is a 75 y.o.  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 He has been on chronic coumadin therapy.   R/LHC 02/2013 RA = 28 with prominent v-waves  RV = 67/21/28  PA = 70/23 (42)  PCW = 29  Fick cardiac output/index = 7.5/2.8  PVR = 1.8 [place]  SVR = 610  FA sat = 96%  PA sat = 64%, 70%  No RV LV interaction  Near equalization of RV, LV and RA diastolic pressures ** Essentially normal coronary arteries**  Echo 03/2013 EF 45-50% with grade II diastolic dysfunction, moderate to severe RV dilation with mild systolic dysfunction, PA systolic pressure 60 mmHg.   Admitted 1/30-2/04/2016 with volume overload.  Creatinine was noted to have been gradually increasing over the past several months prior to admission, and was as high as 3.92 (day of discharge) With BUN in range of 90-100. He had left AVF placed 08/26/15. Dialysis not initiated during that admission. Discharge weight 281 lbs.  Admitted 11/10/15-11/25/15 with worsening dyspnea and noted AKI. He had fistulogram on 5/4 which showed his previously placed AVF was not usable and had repeat AV fistula placed 11/23/15.  Dialysis initiated during this admission via temporary cath. Discharge weight 228 lbs (Down from > 270 on admission)  He presents today for follow up.  Now undergoing HD Tue/Thur/Saturday. Feeling a lot better. Weight at home 222 this morning, Dry weight 218 Looking at R hip replacement with Dr Rush Farmer, would be frontal, less invasive approach. Breathing has been great.  Mostly limited by R hip and knee pain. Increasing his activity as able. Denies SOB or CP.  No orthopnea or PND. Does have some trouble sleeping. Denies lightheadedness or dizziness. Has even been able to  travel to [place] for his grandsons graduation. Has only been taking half of a 3.125 coreg with hypotension during dialysis days. (not taking BID).   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  Past Medical History  Diagnosis Date  . Nephrolithiasis   . Rheumatoid arthritis ([place])   . Colon polyp 2000  . Atrial fibrillation (HCC)     Chronic  . Unspecified essential hypertension   . Gout   . CHF (congestive heart failure) ([place])   . Symptomatic anemia   .  Diabetes mellitus type 2, controlled, with complications ([place])   . CKD (chronic kidney disease), stage IV ([place])   . Chronic combined systolic and diastolic heart failure ([place])   . Acute renal failure superimposed on stage 3 chronic kidney disease ([place])   . AKI (acute kidney injury) ([place])   . Cardiorenal disease   . GI bleed   . Supratherapeutic INR   . Heme positive stool   . Coagulopathy ([place])   . Gastritis and gastroduodenitis   . Pleural effusion   . Tachypnea   . Acute blood loss anemia   . Primary osteoarthritis of right hip   . Thrombocytopenia ([place])   . S/P dialysis catheter insertion ([place])   . ESRD on dialysis ([place]) 11/27/2015  . DM (diabetes mellitus), type 2 with renal complications ([place]) XX123456    Qualifier: Diagnosis of  By: Inda Castle FNP, [name] S   . OSA (obstructive sleep apnea) 09/02/2013     IMPRESSION :  1. Mild obstructive sleep apnea with hypopneas causing sleep  fragmentation and moderate oxygen desaturation.  2. Short runs of nonsustained VT were noted. His beta blocker may need to be titrated 3. Significant PLM's were noted, the PLM arousal index was low. Please correlate with clinical history of restless leg syndrome.  4. Sleep efficiency was poor.  RECOMMENDATION:  1. Treatment options for this degree of sleep disordered breathing include weight loss and positional therapy to avoid supine sleep 2. Consider titrating beta blocker further, defer to cardiologist 3. Patient should be cautioned against driving when sleepy.They should be asked to avoid medications with sedative side effects     . Pleural effusion on right     Current Outpatient Prescriptions  Medication Sig Dispense Refill  . albuterol (PROVENTIL) (2.5 MG/3ML) 0.083% nebulizer solution Take 2.5 mg by nebulization every 4 (four) hours as needed for shortness of breath. Reported on 09/25/2015    . allopurinol (ZYLOPRIM) 100 MG tablet Take 2 tablets (200 mg total) by mouth daily. 30 tablet 0  . carvedilol (COREG) 3.125 MG tablet Take 1.56 mg by mouth daily.     . fluticasone (FLONASE) 50 MCG/ACT nasal spray Place 2 sprays into both nostrils daily. 16 g 6  . hydroxychloroquine (PLAQUENIL) 200 MG tablet Take 200 mg by mouth 2 (two) times daily. Reported on 01/13/2016    . Multiple Vitamin (MULTIVITAMIN WITH MINERALS) TABS tablet Take 1 tablet by mouth daily.    . pantoprazole (PROTONIX) 40 MG tablet Take 1 tablet (40 mg total) by mouth 2 (two) times daily. 60 tablet 0  . promethazine (PHENERGAN) 25 MG tablet Take 1 tablet (25 mg total) by mouth every 6 (six) hours as needed for nausea or vomiting. 30 tablet 0  . senna (SENOKOT) 8.6 MG TABS tablet Take 1 tablet (8.6 mg total) by mouth daily as needed for mild constipation or moderate constipation. 120 each 0  . warfarin (COUMADIN) 4 MG tablet Take 8 mg by mouth daily. Two 4 mg tablets to = 8 mg    . warfarin (COUMADIN) 7.5 MG tablet TAKE 1 TABLET  BY MOUTH DAILY AS DIRECTED BY COUMADIN CLINIC 40 tablet 1  . HYDROcodone-acetaminophen (NORCO/VICODIN) 5-325 MG tablet Take 1-2 tablets by mouth every 4 (four) hours as needed for moderate pain. (Patient not taking: Reported on 01/13/2016) 30 tablet 0   No current facility-administered medications for this encounter.   PHYSICAL EXAM: Filed Vitals:   01/13/16 0906  BP: 110/58  Pulse: 56  Weight: 227 lb 12.8  oz (103.329 kg)  SpO2: 100%   Wt Readings from Last 3 Encounters:  01/13/16 227 lb 12.8 oz (103.329 kg)  12/29/15 232 lb (105.235 kg)  12/16/15 224 lb (101.606 kg)     General:  Well appearing. No resp difficulty. In [place].  HEENT: normal Neck: supple. JVP flat,  Carotids 2+ bilaterally; no bruits. No thyromegaly or nodule noted.  Cor: PMI normal. Bradycardic, Irregular, No rubs, gallops.  2/6 early SEM RUSB Lungs: Clear, normal effort.  Abdomen: soft, NT, ND, no HSM. No bruits or masses. +BS  Extremities: no cyanosis, clubbing, rash,  2-3 edema. L forearm AV with thrill.  Neuro: alert & orientedx3, cranial nerves grossly intact. Moves all 4 extremities w/o difficulty. Affect pleasant.  ASSESSMENT & PLAN: 1) Chronic systolic HF with prominent RV failure: Echo 07/2015 EF 40-45%, diffuse hypokinesis, diastolic flattening, Mod LAE, Severe RAE, Severe TR, PA peak pressure 44 mm Hg.  RHC/LHC in 8/14 with no CAD, suggestive of restrictive physiology, and pulmonary venous hypertension (low PVR).  - NYHA II symptoms now that he is on dialysis.   - Fluid status per HD. Tues, Thursday, Saturdays. Dry weight is 217-218 lbs per patient.  - Continue coreg 1.56 mg (half of a 3.125 tab)  Daily. Will just continue this for now.  2) OSA: Unable to tolerate CPAP previously. F/u sleep study was improved.  3) Atrial fibrillation: Chronic, rate controlled. Continue coumadin.  - Have previously discussed NOAC and he is not interested - Bradycardia is chronic and asymptomatic 4) ESRD now on dialysis.  -  Follows with Dr [name].  - Much improved overall now on dialysis.  5) R Hip pain - Has discussed Hip replacement with Dr. Rush Farmer - Will repeat Echo.  If EF stable will be at moderate amount of risk.  Meds as above.  Surgical clearance pending Echo.    Shirley Friar PA-C 01/13/2016  Patient seen and examined with Oda Kilts, PA-C. We discussed all aspects of the encounter. I agree with the assessment and plan as stated above.   His volume status is much improved with HD. Tolerating well. He now has life-limiting hip pain and want to proceed with THR. I suspect he will tolerate hip replacement well but we will repeat echo to reassess LVEF prior to formal clearance. Unless echo shows severe LV dysfunction; I feel he his at mild to moderate risk for peri-op CV complications and can proceed once we have echo result in hand.   Eain Mullendore,MD 7:41 PM

## 2016-01-13 NOTE — Patient Instructions (Signed)
Your physician has requested that you have an echocardiogram. Echocardiography is a painless test that uses sound waves to create images of your heart. It provides your doctor with information about the size and shape of your heart and how well your heart's chambers and valves are working. This procedure takes approximately one hour. There are no restrictions for this procedure.  Your physician recommends that you schedule a follow-up appointment in: 4 months with Dr Haroldine Laws

## 2016-01-14 DIAGNOSIS — I4891 Unspecified atrial fibrillation: Secondary | ICD-10-CM | POA: Diagnosis not present

## 2016-01-14 DIAGNOSIS — N186 End stage renal disease: Secondary | ICD-10-CM | POA: Diagnosis not present

## 2016-01-14 DIAGNOSIS — N2581 Secondary hyperparathyroidism of renal origin: Secondary | ICD-10-CM | POA: Diagnosis not present

## 2016-01-14 DIAGNOSIS — D508 Other iron deficiency anemias: Secondary | ICD-10-CM | POA: Diagnosis not present

## 2016-01-15 DIAGNOSIS — Z992 Dependence on renal dialysis: Secondary | ICD-10-CM | POA: Diagnosis not present

## 2016-01-15 DIAGNOSIS — I158 Other secondary hypertension: Secondary | ICD-10-CM | POA: Diagnosis not present

## 2016-01-15 DIAGNOSIS — N186 End stage renal disease: Secondary | ICD-10-CM | POA: Diagnosis not present

## 2016-01-16 DIAGNOSIS — N186 End stage renal disease: Secondary | ICD-10-CM | POA: Diagnosis not present

## 2016-01-16 DIAGNOSIS — D508 Other iron deficiency anemias: Secondary | ICD-10-CM | POA: Diagnosis not present

## 2016-01-16 DIAGNOSIS — I4891 Unspecified atrial fibrillation: Secondary | ICD-10-CM | POA: Diagnosis not present

## 2016-01-16 DIAGNOSIS — Z23 Encounter for immunization: Secondary | ICD-10-CM | POA: Diagnosis not present

## 2016-01-19 DIAGNOSIS — D508 Other iron deficiency anemias: Secondary | ICD-10-CM | POA: Diagnosis not present

## 2016-01-19 DIAGNOSIS — Z01810 Encounter for preprocedural cardiovascular examination: Secondary | ICD-10-CM | POA: Insufficient documentation

## 2016-01-19 DIAGNOSIS — Z23 Encounter for immunization: Secondary | ICD-10-CM | POA: Diagnosis not present

## 2016-01-19 DIAGNOSIS — N186 End stage renal disease: Secondary | ICD-10-CM | POA: Diagnosis not present

## 2016-01-19 DIAGNOSIS — I4891 Unspecified atrial fibrillation: Secondary | ICD-10-CM | POA: Diagnosis not present

## 2016-01-20 DIAGNOSIS — M25551 Pain in right hip: Secondary | ICD-10-CM | POA: Diagnosis not present

## 2016-01-21 DIAGNOSIS — I4891 Unspecified atrial fibrillation: Secondary | ICD-10-CM | POA: Diagnosis not present

## 2016-01-21 DIAGNOSIS — N186 End stage renal disease: Secondary | ICD-10-CM | POA: Diagnosis not present

## 2016-01-21 DIAGNOSIS — D508 Other iron deficiency anemias: Secondary | ICD-10-CM | POA: Diagnosis not present

## 2016-01-21 DIAGNOSIS — Z23 Encounter for immunization: Secondary | ICD-10-CM | POA: Diagnosis not present

## 2016-01-22 ENCOUNTER — Ambulatory Visit (HOSPITAL_COMMUNITY)
Admission: RE | Admit: 2016-01-22 | Discharge: 2016-01-22 | Disposition: A | Discharge status: Home / Self Care | Payer: Medicare Other | Source: Ambulatory Visit | Attending: Family | Admitting: Family

## 2016-01-22 DIAGNOSIS — I071 Rheumatic tricuspid insufficiency: Secondary | ICD-10-CM | POA: Insufficient documentation

## 2016-01-22 DIAGNOSIS — N189 Chronic kidney disease, unspecified: Secondary | ICD-10-CM | POA: Diagnosis not present

## 2016-01-22 DIAGNOSIS — E1122 Type 2 diabetes mellitus with diabetic chronic kidney disease: Secondary | ICD-10-CM | POA: Diagnosis not present

## 2016-01-22 DIAGNOSIS — I5042 Chronic combined systolic (congestive) and diastolic (congestive) heart failure: Secondary | ICD-10-CM | POA: Diagnosis not present

## 2016-01-22 DIAGNOSIS — I4891 Unspecified atrial fibrillation: Secondary | ICD-10-CM | POA: Insufficient documentation

## 2016-01-22 LAB — ECHOCARDIOGRAM COMPLETE
AO mean calculated velocity dopler: 184 cm/s
AOPV: 0.34 m/s
AOVTI: 51.3 cm
AV Area VTI index: 0.5 cm2/m2
AV Area VTI: 1.28 cm2
AV Mean grad: 17 mmHg
AV Peak grad: 34 mmHg
AV VEL mean LVOT/AV: 0.31
AV peak Index: 0.55
AVAREAMEANV: 1.18 cm2
AVAREAMEANVIN: 0.51 cm2/m2
AVPKVEL: 290 cm/s
CHL CUP AV VEL: 1.17
CHL CUP RV SYS PRESS: 59 mmHg
CHL CUP TV REG PEAK VELOCITY: 330 cm/s
E decel time: 190 msec
E/e' ratio: 7.73
FS: 32 % (ref 28–44)
IVS/LV PW RATIO, ED: 1.55
LA ID, A-P, ES: 45 mm
LA diam end sys: 45 mm
LA vol index: 50.9 mL/m2
LA vol: 118 mL
LADIAMINDEX: 1.94 cm/m2
LAVOLA4C: 101 mL
LV E/e' medial: 7.73
LV E/e'average: 7.73
LV SIMPSON'S DISK: 53
LV TDI E'LATERAL: 11.5
LV TDI E'MEDIAL: 11.3
LVDIAVOL: 139 mL (ref 62–150)
LVDIAVOLIN: 60 mL/m2
LVELAT: 11.5 cm/s
LVOT VTI: 15.8 cm
LVOT area: 3.8 cm2
LVOT diameter: 22 mm
LVOT peak vel: 97.4 cm/s
LVOTSV: 60 mL
LVOTVTI: 0.31 cm
LVSYSVOL: 65 mL — AB (ref 21–61)
LVSYSVOLIN: 28 mL/m2
MV Dec: 190
MV Peak grad: 3 mmHg
MV pk E vel: 88.9 m/s
PW: 9.81 mm — AB (ref 0.6–1.1)
RV LATERAL S' VELOCITY: 14.1 cm/s
RV TAPSE: 26.2 mm
Stroke v: 74 ml
TRMAXVEL: 330 cm/s
Valve area index: 0.5
Valve area: 1.17 cm2

## 2016-01-22 NOTE — Progress Notes (Signed)
  Echocardiogram 2D Echocardiogram has been performed.  Darlina Sicilian M 01/22/2016, 11:38 AM

## 2016-01-23 DIAGNOSIS — D508 Other iron deficiency anemias: Secondary | ICD-10-CM | POA: Diagnosis not present

## 2016-01-23 DIAGNOSIS — I4891 Unspecified atrial fibrillation: Secondary | ICD-10-CM | POA: Diagnosis not present

## 2016-01-23 DIAGNOSIS — Z23 Encounter for immunization: Secondary | ICD-10-CM | POA: Diagnosis not present

## 2016-01-23 DIAGNOSIS — N186 End stage renal disease: Secondary | ICD-10-CM | POA: Diagnosis not present

## 2016-01-26 DIAGNOSIS — I4891 Unspecified atrial fibrillation: Secondary | ICD-10-CM | POA: Diagnosis not present

## 2016-01-26 DIAGNOSIS — D508 Other iron deficiency anemias: Secondary | ICD-10-CM | POA: Diagnosis not present

## 2016-01-26 DIAGNOSIS — N186 End stage renal disease: Secondary | ICD-10-CM | POA: Diagnosis not present

## 2016-01-26 DIAGNOSIS — Z23 Encounter for immunization: Secondary | ICD-10-CM | POA: Diagnosis not present

## 2016-01-27 ENCOUNTER — Ambulatory Visit (INDEPENDENT_AMBULATORY_CARE_PROVIDER_SITE_OTHER): Payer: Medicare Other | Admitting: Pharmacist

## 2016-01-27 DIAGNOSIS — I4891 Unspecified atrial fibrillation: Secondary | ICD-10-CM

## 2016-01-27 DIAGNOSIS — Z5181 Encounter for therapeutic drug level monitoring: Secondary | ICD-10-CM | POA: Diagnosis not present

## 2016-01-27 DIAGNOSIS — I482 Chronic atrial fibrillation, unspecified: Secondary | ICD-10-CM

## 2016-01-27 DIAGNOSIS — Z7901 Long term (current) use of anticoagulants: Secondary | ICD-10-CM

## 2016-01-27 LAB — POCT INR: INR: 1.6

## 2016-01-28 DIAGNOSIS — N186 End stage renal disease: Secondary | ICD-10-CM | POA: Diagnosis not present

## 2016-01-28 DIAGNOSIS — Z23 Encounter for immunization: Secondary | ICD-10-CM | POA: Diagnosis not present

## 2016-01-28 DIAGNOSIS — I4891 Unspecified atrial fibrillation: Secondary | ICD-10-CM | POA: Diagnosis not present

## 2016-01-28 DIAGNOSIS — D508 Other iron deficiency anemias: Secondary | ICD-10-CM | POA: Diagnosis not present

## 2016-01-30 DIAGNOSIS — N186 End stage renal disease: Secondary | ICD-10-CM | POA: Diagnosis not present

## 2016-01-30 DIAGNOSIS — I4891 Unspecified atrial fibrillation: Secondary | ICD-10-CM | POA: Diagnosis not present

## 2016-01-30 DIAGNOSIS — D508 Other iron deficiency anemias: Secondary | ICD-10-CM | POA: Diagnosis not present

## 2016-01-30 DIAGNOSIS — Z23 Encounter for immunization: Secondary | ICD-10-CM | POA: Diagnosis not present

## 2016-02-02 DIAGNOSIS — N186 End stage renal disease: Secondary | ICD-10-CM | POA: Diagnosis not present

## 2016-02-02 DIAGNOSIS — Z23 Encounter for immunization: Secondary | ICD-10-CM | POA: Diagnosis not present

## 2016-02-02 DIAGNOSIS — I4891 Unspecified atrial fibrillation: Secondary | ICD-10-CM | POA: Diagnosis not present

## 2016-02-02 DIAGNOSIS — D508 Other iron deficiency anemias: Secondary | ICD-10-CM | POA: Diagnosis not present

## 2016-02-03 ENCOUNTER — Telehealth: Payer: Self-pay | Admitting: Family

## 2016-02-03 ENCOUNTER — Encounter: Payer: Self-pay | Admitting: Family

## 2016-02-03 ENCOUNTER — Ambulatory Visit (INDEPENDENT_AMBULATORY_CARE_PROVIDER_SITE_OTHER): Payer: Medicare Other | Admitting: Family

## 2016-02-03 VITALS — BP 110/60 | HR 81 | Temp 98.1°F | Resp 16 | Ht 75.0 in | Wt 229.2 lb

## 2016-02-03 DIAGNOSIS — E785 Hyperlipidemia, unspecified: Secondary | ICD-10-CM | POA: Diagnosis not present

## 2016-02-03 DIAGNOSIS — M1610 Unilateral primary osteoarthritis, unspecified hip: Secondary | ICD-10-CM

## 2016-02-03 DIAGNOSIS — I10 Essential (primary) hypertension: Secondary | ICD-10-CM

## 2016-02-03 DIAGNOSIS — E1122 Type 2 diabetes mellitus with diabetic chronic kidney disease: Secondary | ICD-10-CM

## 2016-02-03 DIAGNOSIS — Z992 Dependence on renal dialysis: Secondary | ICD-10-CM

## 2016-02-03 DIAGNOSIS — M059 Rheumatoid arthritis with rheumatoid factor, unspecified: Secondary | ICD-10-CM

## 2016-02-03 DIAGNOSIS — N186 End stage renal disease: Secondary | ICD-10-CM

## 2016-02-03 DIAGNOSIS — E119 Type 2 diabetes mellitus without complications: Secondary | ICD-10-CM | POA: Diagnosis not present

## 2016-02-03 LAB — LIPID PANEL
CHOLESTEROL: 169 mg/dL (ref 0–200)
HDL: 87.9 mg/dL (ref >39.00)
LDL CALC: 69 mg/dL (ref 0–99)
NonHDL: 80.95
TRIGLYCERIDES: 58 mg/dL (ref 0.0–149.0)
Total CHOL/HDL Ratio: 2
VLDL: 11.6 mg/dL (ref 0.0–40.0)

## 2016-02-03 LAB — HEMOGLOBIN A1C: Hgb A1c MFr Bld: 5.4 % (ref 4.6–6.5)

## 2016-02-03 NOTE — Patient Instructions (Signed)
Please complete lab work prior to leaving.   

## 2016-02-03 NOTE — Telephone Encounter (Signed)
I forwarded form to Dr. Charlett Blake for signature.

## 2016-02-03 NOTE — Progress Notes (Signed)
Subjective:    Patient ID: Bruce Cole, male    DOB: 1940-09-10, 75 y.o.   MRN: [id number]  HPI  Bruce Cole is a 75 yr old male who presents today for follow up.  1) DM2- maintained on diet alone. Lab Results  Component Value Date   HGBA1C 6.7* 11/04/2015   HGBA1C 6.7* 08/04/2015   HGBA1C 6.6* 03/18/2015   Lab Results  Component Value Date   MICROALBUR 20.7* 12/16/2014   LDLCALC 95 03/18/2015   CREATININE 6.6* 11/30/2015   2) HTN- BP is stable off of medications since beginning HD.   BP Readings from Last 3 Encounters:  02/03/16 110/60  01/13/16 110/58  12/29/15 112/64    3)  RA- following with rheumatology. unchanged  4) ESRD- continues on HD.   Wt Readings from Last 3 Encounters:  02/03/16 229 lb 3.2 oz (103.964 kg)  01/13/16 227 lb 12.8 oz (103.329 kg)  12/29/15 232 lb (105.235 kg)   He will have R THA with Dr. Ninfa Linden- waiting for date  Review of Systems    see HPI  Past Medical History  Diagnosis Date  . Nephrolithiasis   . Rheumatoid arthritis ([place])   . Colon polyp 2000  . Atrial fibrillation (HCC)     Chronic  . Unspecified essential hypertension   . Gout   . CHF (congestive heart failure) ([place])   . Symptomatic anemia   . Diabetes mellitus type 2, controlled, with complications ([place])   . CKD (chronic kidney disease), stage IV ([place])   . Chronic combined systolic and diastolic heart failure ([place])   . Acute renal failure superimposed on stage 3 chronic kidney disease ([place])   . AKI (acute kidney injury) ([place])   . Cardiorenal disease   . GI bleed   . Supratherapeutic INR   . Heme positive stool   . Coagulopathy ([place])   . Gastritis and gastroduodenitis   . Pleural effusion   . Tachypnea   . Acute blood loss anemia   . Primary osteoarthritis of right hip   . Thrombocytopenia ([place])   . S/P dialysis catheter insertion ([place])   . ESRD on dialysis ([place]) 11/27/2015  . DM (diabetes mellitus), type 2 with renal complications ([place]) [id number]   Qualifier: Diagnosis of  By: Inda Castle FNP, Emmalia Heyboer S   . OSA (obstructive sleep apnea) 09/02/2013     IMPRESSION :  1. Mild obstructive sleep apnea with hypopneas causing sleep fragmentation and moderate oxygen desaturation.  2. Short runs of nonsustained VT were noted. His beta blocker may need to be titrated 3. Significant PLM's were noted, the PLM arousal index was low. Please correlate with clinical history of restless leg syndrome.  4. Sleep efficiency was poor.  RECOMMENDATION:  1. Treatment options for this degree of sleep disordered breathing include weight loss and positional therapy to avoid supine sleep 2. Consider titrating beta blocker further, defer to cardiologist 3. Patient should be cautioned against driving when sleepy.They should be asked to avoid medications with sedative side effects     . Pleural effusion on right      Social History   Social History  . Marital Status: Single    Spouse Name: N/A  . Number of Children: 3  . Years of Education: N/A   Occupational History  . retired Programmer, multimedia     Social History Main Topics  . Smoking status: Never Smoker   . Smokeless tobacco: Never Used  . Alcohol Use: Yes  Comment: occasional  . Drug Use: No  . Sexual Activity: Not on file   Other Topics Concern  . Not on file   Social History Narrative   Former [place] [employer] and [place]   Admitted to [place] 11/25/15   Widowed   Never smoked   FULL CODE    Past Surgical History  Procedure Laterality Date  . Spine surgery      x 2  . Joint replacement      Total L-Hip replacement, Right Knee 10/20/09  . Cholecystectomy  1994  . Left and right heart catheterization with coronary angiogram N/A 02/22/2013    Procedure: LEFT AND RIGHT HEART CATHETERIZATION WITH CORONARY ANGIOGRAM;  Surgeon: Jolaine Artist, MD;  Location: [facility] CATH LAB;  Service: Cardiovascular;  Laterality: N/A;  . Av fistula placement Left 08/26/2015    Procedure: LEFT  RADIOCEPHALIC FISTULA CREATION;  Surgeon: Rosetta Posner, MD;  Location: [facility] OR;  Service: Vascular;  Laterality: Left;  . Esophagogastroduodenoscopy N/A 11/13/2015    Procedure: ESOPHAGOGASTRODUODENOSCOPY (EGD);  Surgeon: Irene Shipper, MD;  Location: [facility] ENDOSCOPY;  Service: Endoscopy;  Laterality: N/A;  . Peripheral vascular catheterization Left 11/19/2015    Procedure: A/V/Fistulagram;  Surgeon: Conrad Cypress, MD;  Location: [place] CV LAB;  Service: Cardiovascular;  Laterality: Left;  . Av fistula placement Left 11/23/2015    Procedure: ARTERIOVENOUS (AV) FISTULA CREATION;  Surgeon: Rosetta Posner, MD;  Location: [place];  Service: Vascular;  Laterality: Left;  . Insertion of dialysis catheter Left 11/23/2015    Procedure: INSERTION OF DIALYSIS CATHETER;  Surgeon: Rosetta Posner, MD;  Location: [facility] OR;  Service: Vascular;  Laterality: Left;    Family History  Problem Relation Age of Onset  . Hypertension Mother   . Arthritis Mother     ?RA  . Hypertension Father     Allergies  Allergen Reactions  . Ace Inhibitors Other (See Comments)    Worsening renal insufficiency    Current Outpatient Prescriptions on File Prior to Visit  Medication Sig Dispense Refill  . albuterol (PROVENTIL) (2.5 MG/3ML) 0.083% nebulizer solution Take 2.5 mg by nebulization every 4 (four) hours as needed for shortness of breath. Reported on 09/25/2015    . allopurinol (ZYLOPRIM) 100 MG tablet Take 2 tablets (200 mg total) by mouth daily. 30 tablet 0  . fluticasone (FLONASE) 50 MCG/ACT nasal spray Place 2 sprays into both nostrils daily. 16 g 6  . hydroxychloroquine (PLAQUENIL) 200 MG tablet Take 200 mg by mouth 2 (two) times daily. Reported on 01/13/2016    . Multiple Vitamin (MULTIVITAMIN WITH MINERALS) TABS tablet Take 1 tablet by mouth daily.    . pantoprazole (PROTONIX) 40 MG tablet Take 1 tablet (40 mg total) by mouth 2 (two) times daily. 60 tablet 0  . warfarin (COUMADIN) 7.5 MG tablet TAKE 1 TABLET BY MOUTH DAILY AS  DIRECTED BY COUMADIN CLINIC 40 tablet 1   No current facility-administered medications on file prior to visit.    BP 110/60 mmHg  Pulse 81  Temp(Src) 98.1 F (36.7 C) (Oral)  Resp 16  Ht 6\' 3"  (1.905 m)  Wt 229 lb 3.2 oz (103.964 kg)  BMI 28.65 kg/m2  SpO2 98%    Objective:   Physical Exam  Constitutional: He is oriented to person, place, and time. He appears well-developed and well-nourished. No distress.  HENT:  Head: Normocephalic and atraumatic.  Cardiovascular: Normal rate and regular rhythm.   No murmur heard. Pulmonary/Chest:  Effort normal and breath sounds normal. No respiratory distress. He has no wheezes. He has no rales.  Musculoskeletal: He exhibits no edema.  Neurological: He is alert and oriented to person, place, and time.  Skin: Skin is warm and dry.  Psychiatric: He has a normal mood and affect. His behavior is normal. Thought content normal.          Assessment & Plan:

## 2016-02-03 NOTE — Assessment & Plan Note (Signed)
Lab Results  Component Value Date   HGBA1C 5.4 02/03/2016   Follow up A1C looks great.  He has lost a lot of weight recently.

## 2016-02-03 NOTE — Assessment & Plan Note (Signed)
Continues HD. Tolerating HD well.  Management per nephrology.

## 2016-02-03 NOTE — Assessment & Plan Note (Signed)
BP stable, currently off of meds.

## 2016-02-03 NOTE — Assessment & Plan Note (Signed)
Awaiting R THA

## 2016-02-03 NOTE — Assessment & Plan Note (Addendum)
Maintained on plaquenil. Managed by rheumatology.

## 2016-02-03 NOTE — Telephone Encounter (Signed)
Relation to [id number] Call back [phone]  Reason for call:  Patient checked out after seeing NP and would like PCP to fill out disability parking placard form forwarded to [employer].  In addition 02/03/16 AVS stated return in 3 month patient had a 03/16/16 appointment scheduled and patient stated he will return back for this appoiment rather then coming back in 3 month.

## 2016-02-03 NOTE — Telephone Encounter (Signed)
Form forwarded to Tricia/[name]

## 2016-02-03 NOTE — Progress Notes (Signed)
Pre visit review using our clinic review tool, if applicable. No additional management support is needed unless otherwise documented below in the visit note. 

## 2016-02-04 DIAGNOSIS — I4891 Unspecified atrial fibrillation: Secondary | ICD-10-CM | POA: Diagnosis not present

## 2016-02-04 DIAGNOSIS — Z23 Encounter for immunization: Secondary | ICD-10-CM | POA: Diagnosis not present

## 2016-02-04 DIAGNOSIS — D508 Other iron deficiency anemias: Secondary | ICD-10-CM | POA: Diagnosis not present

## 2016-02-04 DIAGNOSIS — N186 End stage renal disease: Secondary | ICD-10-CM | POA: Diagnosis not present

## 2016-02-06 DIAGNOSIS — D508 Other iron deficiency anemias: Secondary | ICD-10-CM | POA: Diagnosis not present

## 2016-02-06 DIAGNOSIS — I4891 Unspecified atrial fibrillation: Secondary | ICD-10-CM | POA: Diagnosis not present

## 2016-02-06 DIAGNOSIS — N186 End stage renal disease: Secondary | ICD-10-CM | POA: Diagnosis not present

## 2016-02-06 DIAGNOSIS — Z23 Encounter for immunization: Secondary | ICD-10-CM | POA: Diagnosis not present

## 2016-02-08 NOTE — Telephone Encounter (Signed)
Disability parking placard completed and ready for pick up.  Called patient and left a message for call back.  When patient calls back, please ask if he would like to pick up form or have [place] mail it to him?  Thanks.

## 2016-02-09 DIAGNOSIS — I4891 Unspecified atrial fibrillation: Secondary | ICD-10-CM | POA: Diagnosis not present

## 2016-02-09 DIAGNOSIS — N186 End stage renal disease: Secondary | ICD-10-CM | POA: Diagnosis not present

## 2016-02-09 DIAGNOSIS — D508 Other iron deficiency anemias: Secondary | ICD-10-CM | POA: Diagnosis not present

## 2016-02-09 DIAGNOSIS — Z23 Encounter for immunization: Secondary | ICD-10-CM | POA: Diagnosis not present

## 2016-02-11 DIAGNOSIS — D508 Other iron deficiency anemias: Secondary | ICD-10-CM | POA: Diagnosis not present

## 2016-02-11 DIAGNOSIS — N186 End stage renal disease: Secondary | ICD-10-CM | POA: Diagnosis not present

## 2016-02-11 DIAGNOSIS — I4891 Unspecified atrial fibrillation: Secondary | ICD-10-CM | POA: Diagnosis not present

## 2016-02-11 DIAGNOSIS — Z23 Encounter for immunization: Secondary | ICD-10-CM | POA: Diagnosis not present

## 2016-02-13 DIAGNOSIS — N186 End stage renal disease: Secondary | ICD-10-CM | POA: Diagnosis not present

## 2016-02-13 DIAGNOSIS — Z23 Encounter for immunization: Secondary | ICD-10-CM | POA: Diagnosis not present

## 2016-02-13 DIAGNOSIS — I4891 Unspecified atrial fibrillation: Secondary | ICD-10-CM | POA: Diagnosis not present

## 2016-02-13 DIAGNOSIS — D508 Other iron deficiency anemias: Secondary | ICD-10-CM | POA: Diagnosis not present

## 2016-02-15 DIAGNOSIS — N186 End stage renal disease: Secondary | ICD-10-CM | POA: Diagnosis not present

## 2016-02-15 DIAGNOSIS — I158 Other secondary hypertension: Secondary | ICD-10-CM | POA: Diagnosis not present

## 2016-02-15 DIAGNOSIS — Z992 Dependence on renal dialysis: Secondary | ICD-10-CM | POA: Diagnosis not present

## 2016-02-16 DIAGNOSIS — I4891 Unspecified atrial fibrillation: Secondary | ICD-10-CM | POA: Diagnosis not present

## 2016-02-16 DIAGNOSIS — D631 Anemia in chronic kidney disease: Secondary | ICD-10-CM | POA: Diagnosis not present

## 2016-02-16 DIAGNOSIS — D509 Iron deficiency anemia, unspecified: Secondary | ICD-10-CM | POA: Diagnosis not present

## 2016-02-16 DIAGNOSIS — D508 Other iron deficiency anemias: Secondary | ICD-10-CM | POA: Diagnosis not present

## 2016-02-16 DIAGNOSIS — N2581 Secondary hyperparathyroidism of renal origin: Secondary | ICD-10-CM | POA: Diagnosis not present

## 2016-02-16 DIAGNOSIS — N186 End stage renal disease: Secondary | ICD-10-CM | POA: Diagnosis not present

## 2016-02-17 ENCOUNTER — Ambulatory Visit (INDEPENDENT_AMBULATORY_CARE_PROVIDER_SITE_OTHER): Payer: Medicare Other | Admitting: *Deleted

## 2016-02-17 DIAGNOSIS — I4891 Unspecified atrial fibrillation: Secondary | ICD-10-CM

## 2016-02-17 DIAGNOSIS — Z5181 Encounter for therapeutic drug level monitoring: Secondary | ICD-10-CM | POA: Diagnosis not present

## 2016-02-17 DIAGNOSIS — Z7901 Long term (current) use of anticoagulants: Secondary | ICD-10-CM | POA: Diagnosis not present

## 2016-02-17 LAB — POCT INR: INR: 2.4

## 2016-02-18 ENCOUNTER — Other Ambulatory Visit: Payer: Self-pay | Admitting: Adult Health

## 2016-02-18 DIAGNOSIS — N186 End stage renal disease: Secondary | ICD-10-CM | POA: Diagnosis not present

## 2016-02-18 DIAGNOSIS — I4891 Unspecified atrial fibrillation: Secondary | ICD-10-CM | POA: Diagnosis not present

## 2016-02-18 DIAGNOSIS — N2581 Secondary hyperparathyroidism of renal origin: Secondary | ICD-10-CM | POA: Diagnosis not present

## 2016-02-18 DIAGNOSIS — D508 Other iron deficiency anemias: Secondary | ICD-10-CM | POA: Diagnosis not present

## 2016-02-18 DIAGNOSIS — D509 Iron deficiency anemia, unspecified: Secondary | ICD-10-CM | POA: Diagnosis not present

## 2016-02-18 DIAGNOSIS — D631 Anemia in chronic kidney disease: Secondary | ICD-10-CM | POA: Diagnosis not present

## 2016-02-20 DIAGNOSIS — I4891 Unspecified atrial fibrillation: Secondary | ICD-10-CM | POA: Diagnosis not present

## 2016-02-20 DIAGNOSIS — N2581 Secondary hyperparathyroidism of renal origin: Secondary | ICD-10-CM | POA: Diagnosis not present

## 2016-02-20 DIAGNOSIS — D508 Other iron deficiency anemias: Secondary | ICD-10-CM | POA: Diagnosis not present

## 2016-02-20 DIAGNOSIS — D509 Iron deficiency anemia, unspecified: Secondary | ICD-10-CM | POA: Diagnosis not present

## 2016-02-20 DIAGNOSIS — N186 End stage renal disease: Secondary | ICD-10-CM | POA: Diagnosis not present

## 2016-02-20 DIAGNOSIS — D631 Anemia in chronic kidney disease: Secondary | ICD-10-CM | POA: Diagnosis not present

## 2016-02-23 DIAGNOSIS — D508 Other iron deficiency anemias: Secondary | ICD-10-CM | POA: Diagnosis not present

## 2016-02-23 DIAGNOSIS — D509 Iron deficiency anemia, unspecified: Secondary | ICD-10-CM | POA: Diagnosis not present

## 2016-02-23 DIAGNOSIS — N2581 Secondary hyperparathyroidism of renal origin: Secondary | ICD-10-CM | POA: Diagnosis not present

## 2016-02-23 DIAGNOSIS — I4891 Unspecified atrial fibrillation: Secondary | ICD-10-CM | POA: Diagnosis not present

## 2016-02-23 DIAGNOSIS — D631 Anemia in chronic kidney disease: Secondary | ICD-10-CM | POA: Diagnosis not present

## 2016-02-23 DIAGNOSIS — N186 End stage renal disease: Secondary | ICD-10-CM | POA: Diagnosis not present

## 2016-02-25 DIAGNOSIS — N2581 Secondary hyperparathyroidism of renal origin: Secondary | ICD-10-CM | POA: Diagnosis not present

## 2016-02-25 DIAGNOSIS — I4891 Unspecified atrial fibrillation: Secondary | ICD-10-CM | POA: Diagnosis not present

## 2016-02-25 DIAGNOSIS — D631 Anemia in chronic kidney disease: Secondary | ICD-10-CM | POA: Diagnosis not present

## 2016-02-25 DIAGNOSIS — D509 Iron deficiency anemia, unspecified: Secondary | ICD-10-CM | POA: Diagnosis not present

## 2016-02-25 DIAGNOSIS — N186 End stage renal disease: Secondary | ICD-10-CM | POA: Diagnosis not present

## 2016-02-25 DIAGNOSIS — D508 Other iron deficiency anemias: Secondary | ICD-10-CM | POA: Diagnosis not present

## 2016-02-27 DIAGNOSIS — D509 Iron deficiency anemia, unspecified: Secondary | ICD-10-CM | POA: Diagnosis not present

## 2016-02-27 DIAGNOSIS — N2581 Secondary hyperparathyroidism of renal origin: Secondary | ICD-10-CM | POA: Diagnosis not present

## 2016-02-27 DIAGNOSIS — N186 End stage renal disease: Secondary | ICD-10-CM | POA: Diagnosis not present

## 2016-02-27 DIAGNOSIS — D508 Other iron deficiency anemias: Secondary | ICD-10-CM | POA: Diagnosis not present

## 2016-02-27 DIAGNOSIS — I4891 Unspecified atrial fibrillation: Secondary | ICD-10-CM | POA: Diagnosis not present

## 2016-02-27 DIAGNOSIS — D631 Anemia in chronic kidney disease: Secondary | ICD-10-CM | POA: Diagnosis not present

## 2016-03-01 DIAGNOSIS — N186 End stage renal disease: Secondary | ICD-10-CM | POA: Diagnosis not present

## 2016-03-01 DIAGNOSIS — I4891 Unspecified atrial fibrillation: Secondary | ICD-10-CM | POA: Diagnosis not present

## 2016-03-01 DIAGNOSIS — D631 Anemia in chronic kidney disease: Secondary | ICD-10-CM | POA: Diagnosis not present

## 2016-03-01 DIAGNOSIS — D508 Other iron deficiency anemias: Secondary | ICD-10-CM | POA: Diagnosis not present

## 2016-03-01 DIAGNOSIS — D509 Iron deficiency anemia, unspecified: Secondary | ICD-10-CM | POA: Diagnosis not present

## 2016-03-01 DIAGNOSIS — N2581 Secondary hyperparathyroidism of renal origin: Secondary | ICD-10-CM | POA: Diagnosis not present

## 2016-03-03 DIAGNOSIS — I4891 Unspecified atrial fibrillation: Secondary | ICD-10-CM | POA: Diagnosis not present

## 2016-03-03 DIAGNOSIS — N2581 Secondary hyperparathyroidism of renal origin: Secondary | ICD-10-CM | POA: Diagnosis not present

## 2016-03-03 DIAGNOSIS — D631 Anemia in chronic kidney disease: Secondary | ICD-10-CM | POA: Diagnosis not present

## 2016-03-03 DIAGNOSIS — D508 Other iron deficiency anemias: Secondary | ICD-10-CM | POA: Diagnosis not present

## 2016-03-03 DIAGNOSIS — N186 End stage renal disease: Secondary | ICD-10-CM | POA: Diagnosis not present

## 2016-03-03 DIAGNOSIS — D509 Iron deficiency anemia, unspecified: Secondary | ICD-10-CM | POA: Diagnosis not present

## 2016-03-05 DIAGNOSIS — N2581 Secondary hyperparathyroidism of renal origin: Secondary | ICD-10-CM | POA: Diagnosis not present

## 2016-03-05 DIAGNOSIS — D508 Other iron deficiency anemias: Secondary | ICD-10-CM | POA: Diagnosis not present

## 2016-03-05 DIAGNOSIS — D509 Iron deficiency anemia, unspecified: Secondary | ICD-10-CM | POA: Diagnosis not present

## 2016-03-05 DIAGNOSIS — D631 Anemia in chronic kidney disease: Secondary | ICD-10-CM | POA: Diagnosis not present

## 2016-03-05 DIAGNOSIS — N186 End stage renal disease: Secondary | ICD-10-CM | POA: Diagnosis not present

## 2016-03-05 DIAGNOSIS — I4891 Unspecified atrial fibrillation: Secondary | ICD-10-CM | POA: Diagnosis not present

## 2016-03-08 ENCOUNTER — Other Ambulatory Visit: Payer: Self-pay | Admitting: Physician Assistant

## 2016-03-08 DIAGNOSIS — D508 Other iron deficiency anemias: Secondary | ICD-10-CM | POA: Diagnosis not present

## 2016-03-08 DIAGNOSIS — I4891 Unspecified atrial fibrillation: Secondary | ICD-10-CM | POA: Diagnosis not present

## 2016-03-08 DIAGNOSIS — D509 Iron deficiency anemia, unspecified: Secondary | ICD-10-CM | POA: Diagnosis not present

## 2016-03-08 DIAGNOSIS — N2581 Secondary hyperparathyroidism of renal origin: Secondary | ICD-10-CM | POA: Diagnosis not present

## 2016-03-08 DIAGNOSIS — D631 Anemia in chronic kidney disease: Secondary | ICD-10-CM | POA: Diagnosis not present

## 2016-03-08 DIAGNOSIS — N186 End stage renal disease: Secondary | ICD-10-CM | POA: Diagnosis not present

## 2016-03-10 ENCOUNTER — Telehealth: Payer: Self-pay | Admitting: *Deleted

## 2016-03-10 ENCOUNTER — Encounter (HOSPITAL_COMMUNITY): Payer: Self-pay

## 2016-03-10 DIAGNOSIS — I4891 Unspecified atrial fibrillation: Secondary | ICD-10-CM | POA: Diagnosis not present

## 2016-03-10 DIAGNOSIS — D631 Anemia in chronic kidney disease: Secondary | ICD-10-CM | POA: Diagnosis not present

## 2016-03-10 DIAGNOSIS — N2581 Secondary hyperparathyroidism of renal origin: Secondary | ICD-10-CM | POA: Diagnosis not present

## 2016-03-10 DIAGNOSIS — D509 Iron deficiency anemia, unspecified: Secondary | ICD-10-CM | POA: Diagnosis not present

## 2016-03-10 DIAGNOSIS — D508 Other iron deficiency anemias: Secondary | ICD-10-CM | POA: Diagnosis not present

## 2016-03-10 DIAGNOSIS — N186 End stage renal disease: Secondary | ICD-10-CM | POA: Diagnosis not present

## 2016-03-10 NOTE — Telephone Encounter (Signed)
Called & spoke with Marcie Bal at Dr. Marlou Starks office regarding the pt holding his Coumadin for rt hip surgery on 03/22/16.  She stated she would give the information to Dr. Marlou Starks RN & have her respond to how many days the pt should hold Coumadin for upcoming procedure. Fax Number to [facility] given & a direct call back number given, too.

## 2016-03-10 NOTE — Telephone Encounter (Signed)
Bruce Cole from Dr. Marlou Starks office called & left a voicemail informing us that the pt needs to hold Coumadin for 5 days and they would like the INR <1.5; called Bruce Cole back & told her we need a clearance form stating the information & she verbalized understanding & fax number provided to [place].

## 2016-03-10 NOTE — Pre-Procedure Instructions (Addendum)
    Sheng Foxwell Dettmann  03/10/2016      [facility] [place] - [address], [address] AT [facility] [address] [place] [address] Phone: [phone] Fax: [phone]    Your procedure is scheduled on Tuesday, March 22, 2016  Report to [facility] Admitting at 11:00 A.M.  Call this number if you have problems the morning of surgery:  [phone]   Remember: Follow doctors instructions and stop Warfarin ( Coumadin) .or according to dr instruction   Do not eat food or drink liquids after midnight Monday, March 21, 2016  Take these medicines the morning of surgery with A SIP OF WATER : Allopurinol ( Zyloprim), pantoprazole ( Protonix),  Hydroxychloroquine ( Plaquenil), Flonase nasal spray  Stop taking Aspirin,, vitamins, fish oil and herbal medications. Do not take any NSAIDs ie: Ibuprofen, Advil, Naproxen, BC and Goody Powder or any medication containing Aspirin; stop Wednesday, March 16, 2016   Do not wear jewelry, make-up or nail polish.  Do not wear lotions, powders, or perfumes, or deoderant.  Do not shave 48 hours prior to surgery.  Men may shave face and neck.  Do not bring valuables to the hospital.  [facility] is not responsible for any belongings or valuables.  Contacts, dentures or bridgework may not be worn into surgery.  Leave your suitcase in the car.  After surgery it may be brought to your room.  For patients admitted to the hospital, discharge time will be determined by your treatment team.  Patients discharged the day of surgery will not be allowed to drive home.   Name and phone number of your driver:   Special instructions: Shower the night before surgery and the morning of surgery with CHG.  Please read over the following fact sheets that you were given. Pain Booklet, Coughing and Deep Breathing, Total Joint Packet, MRSA Information and Surgical Site Infection Prevention

## 2016-03-11 ENCOUNTER — Encounter (HOSPITAL_COMMUNITY): Payer: Self-pay

## 2016-03-11 ENCOUNTER — Encounter (HOSPITAL_COMMUNITY)
Admission: RE | Admit: 2016-03-11 | Discharge: 2016-03-11 | Disposition: A | Discharge status: Home / Self Care | Payer: Medicare Other | Source: Ambulatory Visit | Attending: Orthopaedic Surgery | Admitting: Orthopaedic Surgery

## 2016-03-11 DIAGNOSIS — I4891 Unspecified atrial fibrillation: Secondary | ICD-10-CM | POA: Diagnosis not present

## 2016-03-11 DIAGNOSIS — Z01818 Encounter for other preprocedural examination: Secondary | ICD-10-CM | POA: Diagnosis not present

## 2016-03-11 DIAGNOSIS — I509 Heart failure, unspecified: Secondary | ICD-10-CM | POA: Insufficient documentation

## 2016-03-11 DIAGNOSIS — Z7901 Long term (current) use of anticoagulants: Secondary | ICD-10-CM | POA: Insufficient documentation

## 2016-03-11 DIAGNOSIS — M1611 Unilateral primary osteoarthritis, right hip: Secondary | ICD-10-CM | POA: Insufficient documentation

## 2016-03-11 DIAGNOSIS — D696 Thrombocytopenia, unspecified: Secondary | ICD-10-CM | POA: Diagnosis not present

## 2016-03-11 DIAGNOSIS — Z01812 Encounter for preprocedural laboratory examination: Secondary | ICD-10-CM | POA: Insufficient documentation

## 2016-03-11 DIAGNOSIS — Z79899 Other long term (current) drug therapy: Secondary | ICD-10-CM | POA: Diagnosis not present

## 2016-03-11 DIAGNOSIS — Z992 Dependence on renal dialysis: Secondary | ICD-10-CM | POA: Diagnosis not present

## 2016-03-11 DIAGNOSIS — M069 Rheumatoid arthritis, unspecified: Secondary | ICD-10-CM | POA: Insufficient documentation

## 2016-03-11 DIAGNOSIS — N186 End stage renal disease: Secondary | ICD-10-CM | POA: Insufficient documentation

## 2016-03-11 DIAGNOSIS — G4733 Obstructive sleep apnea (adult) (pediatric): Secondary | ICD-10-CM | POA: Diagnosis not present

## 2016-03-11 DIAGNOSIS — I132 Hypertensive heart and chronic kidney disease with heart failure and with stage 5 chronic kidney disease, or end stage renal disease: Secondary | ICD-10-CM | POA: Insufficient documentation

## 2016-03-11 HISTORY — DX: Pneumonia, unspecified organism: J18.9

## 2016-03-11 HISTORY — DX: Cardiac murmur, unspecified: R01.1

## 2016-03-11 HISTORY — DX: Cardiac arrhythmia, unspecified: I49.9

## 2016-03-11 LAB — SURGICAL PCR SCREEN
MRSA, PCR: NEGATIVE
Staphylococcus aureus: NEGATIVE

## 2016-03-11 LAB — CBC
HCT: 35.6 % — ABNORMAL LOW (ref 39.0–52.0)
Hemoglobin: 11.6 g/dL — ABNORMAL LOW (ref 13.0–17.0)
MCH: 30.3 pg (ref 26.0–34.0)
MCHC: 32.6 g/dL (ref 30.0–36.0)
MCV: 93 fL (ref 78.0–100.0)
PLATELETS: 172 10*3/uL (ref 150–400)
RBC: 3.83 MIL/uL — ABNORMAL LOW (ref 4.22–5.81)
RDW: 15.4 % (ref 11.5–15.5)
WBC: 8.4 10*3/uL (ref 4.0–10.5)

## 2016-03-11 LAB — BASIC METABOLIC PANEL
Anion gap: 13 (ref 5–15)
BUN: 81 mg/dL — AB (ref 6–20)
CHLORIDE: 93 mmol/L — AB (ref 101–111)
CO2: 26 mmol/L (ref 22–32)
CREATININE: 8.4 mg/dL — AB (ref 0.61–1.24)
Calcium: 9.9 mg/dL (ref 8.9–10.3)
GFR calc Af Amer: 6 mL/min — ABNORMAL LOW (ref >60)
GFR calc non Af Amer: 6 mL/min — ABNORMAL LOW (ref >60)
GLUCOSE: 110 mg/dL — AB (ref 65–99)
Potassium: 4.4 mmol/L (ref 3.5–5.1)
SODIUM: 132 mmol/L — AB (ref 135–145)

## 2016-03-11 LAB — PROTIME-INR: INR: 1.6 — AB (ref 0.9–1.1)

## 2016-03-12 DIAGNOSIS — I4891 Unspecified atrial fibrillation: Secondary | ICD-10-CM | POA: Diagnosis not present

## 2016-03-12 DIAGNOSIS — D508 Other iron deficiency anemias: Secondary | ICD-10-CM | POA: Diagnosis not present

## 2016-03-12 DIAGNOSIS — N186 End stage renal disease: Secondary | ICD-10-CM | POA: Diagnosis not present

## 2016-03-12 DIAGNOSIS — N2581 Secondary hyperparathyroidism of renal origin: Secondary | ICD-10-CM | POA: Diagnosis not present

## 2016-03-12 DIAGNOSIS — D509 Iron deficiency anemia, unspecified: Secondary | ICD-10-CM | POA: Diagnosis not present

## 2016-03-12 DIAGNOSIS — D631 Anemia in chronic kidney disease: Secondary | ICD-10-CM | POA: Diagnosis not present

## 2016-03-15 ENCOUNTER — Telehealth (HOSPITAL_COMMUNITY): Payer: Self-pay | Admitting: Vascular Surgery

## 2016-03-15 ENCOUNTER — Telehealth: Payer: Self-pay | Admitting: Family

## 2016-03-15 DIAGNOSIS — N2581 Secondary hyperparathyroidism of renal origin: Secondary | ICD-10-CM | POA: Diagnosis not present

## 2016-03-15 DIAGNOSIS — I4891 Unspecified atrial fibrillation: Secondary | ICD-10-CM | POA: Diagnosis not present

## 2016-03-15 DIAGNOSIS — D509 Iron deficiency anemia, unspecified: Secondary | ICD-10-CM | POA: Diagnosis not present

## 2016-03-15 DIAGNOSIS — D631 Anemia in chronic kidney disease: Secondary | ICD-10-CM | POA: Diagnosis not present

## 2016-03-15 DIAGNOSIS — D508 Other iron deficiency anemias: Secondary | ICD-10-CM | POA: Diagnosis not present

## 2016-03-15 DIAGNOSIS — N186 End stage renal disease: Secondary | ICD-10-CM | POA: Diagnosis not present

## 2016-03-15 NOTE — Telephone Encounter (Signed)
Please contact pt and ask him if the kidney doctors adjusted his coumadin as his level was low.  I received lab form 8/25 which showed INR 1.63.  How much is he taking?

## 2016-03-15 NOTE — Telephone Encounter (Signed)
LEFT PT MESSAGE TO MAKE 4 MO F/U

## 2016-03-15 NOTE — Progress Notes (Signed)
Anesthesia Chart Review:  Pt is a 75 year old male scheduled for R total hip arthroplasty anterior approach on 03/22/2016 with Jean Rosenthal, MD.   - PCP is Debbrah Alar, NP who is aware of upcoming surgery - Cardiologist is Glori Bickers, MD who has cleared pt for surgery.  - Nephrologist is Erling Cruz, MD.   PMH includes:  Atrial fibrillation, CHF, HTN, DM, ESRD on dialysis, OSA, thrombocytopenia, RA. Never smoker. BMI 29  Medications include: albuterol, plaquenil, protonix, coumadin. Pt to stop coumadin 5 days before surgery.   Preoperative labs reviewed.   - Renal function consistent with ESRD.  - glucose 110. HgbA1c was 5.4 on 02/03/16.  - PT/PTT will be obtained DOS.   1 view CXR 11/23/15: Interval placement of left internal jugular dialysis catheter. No pneumothorax is noted. Mild right basilar subsegmental atelectasis or effusion is noted.  EKG 11/20/15: Junctional rhythm. Left axis deviation. Low voltage QRS. Inferior infarct, age undetermined. Possible Anterolateral infarct, age undetermined  Echo 01/22/2016:  - Left ventricle: The cavity size was normal. Wall thickness was increased in a pattern of moderate LVH. Systolic function was moderately reduced. The estimated ejection fraction was in the range of 35% to 40%. Diffuse hypokinesis. Left ventricular diastolic function parameters were normal. - Aortic valve: There was moderate stenosis. Valve area (VTI): 1.17 cm^2. Valve area (Vmax): 1.28 cm^2. Valve area (Vmean): 1.18 cm^2. - Left atrium: The atrium was moderately dilated. - Right ventricle: The cavity size was severely dilated. - Right atrium: The atrium was severely dilated. - Atrial septum: No defect or patent foramen ovale was identified. - Tricuspid valve: There was moderate-severe regurgitation. - Pulmonary arteries: PA peak pressure: 59 mm Hg (S).  Right and left cardiac cath 02/22/13:  RA = 28 with prominent v-waves RV = 67/21/28 PA = 70/23 (42) PCW =  29 Fick cardiac output/index = 7.5/2.8 PVR = 1.8 [place] SVR = 610 FA sat = 96% PA sat = 64%, 70% No RV LV interaction Near equalization of RV, LV and RA diastolic pressures  Ao Pressure: 112/66/85 LV Pressure: 120/22/27 There was no signficant gradient across the aortic valve on pullback.  - Left main: Normal - LAD: Moderate-sized vessel with several diagonal branches. Very mild plaque in the midsection. Otherwise normal - LCX: Dominant vessel with large-branching OM1 and PDA. Normal  - RCA: Nondominant. Normal - LV-gram done in the RAO projection: Ejection fraction = 45% very mild global HK Assessment: 1. Essentially normal coronary arteries. 2. Mildly reduced LV function with EF 45% 3. Mild-moderate pulmonary venous HTN with normal PVR 4. Markedly elevated RA pressure with prominent v-waves suggestive of significant TR 5. Elevated biventricular diastolic pressure with near equalization or R and L sided diastolic pressures suggestive of restrictive physiology 6. Restrictive physiology  If labs acceptable DOS, I anticipate pt can proceed as scheduled.   [name], FNP-BC [facility]/Anesthesiology Phone: [phone] 03/15/2016 2:39 PM

## 2016-03-16 ENCOUNTER — Ambulatory Visit (INDEPENDENT_AMBULATORY_CARE_PROVIDER_SITE_OTHER): Payer: Medicare Other | Admitting: Family

## 2016-03-16 ENCOUNTER — Encounter: Payer: Self-pay | Admitting: Family

## 2016-03-16 ENCOUNTER — Ambulatory Visit (INDEPENDENT_AMBULATORY_CARE_PROVIDER_SITE_OTHER): Payer: Medicare Other | Admitting: *Deleted

## 2016-03-16 DIAGNOSIS — I4891 Unspecified atrial fibrillation: Secondary | ICD-10-CM

## 2016-03-16 DIAGNOSIS — Z5181 Encounter for therapeutic drug level monitoring: Secondary | ICD-10-CM

## 2016-03-16 DIAGNOSIS — Z992 Dependence on renal dialysis: Secondary | ICD-10-CM

## 2016-03-16 DIAGNOSIS — I10 Essential (primary) hypertension: Secondary | ICD-10-CM

## 2016-03-16 DIAGNOSIS — E1122 Type 2 diabetes mellitus with diabetic chronic kidney disease: Secondary | ICD-10-CM | POA: Diagnosis not present

## 2016-03-16 DIAGNOSIS — Z7901 Long term (current) use of anticoagulants: Secondary | ICD-10-CM | POA: Diagnosis not present

## 2016-03-16 DIAGNOSIS — N186 End stage renal disease: Secondary | ICD-10-CM

## 2016-03-16 LAB — POCT INR: INR: 1.4

## 2016-03-16 NOTE — Telephone Encounter (Signed)
Pt seen in office by PCP today and states that he went to the coumadin clinic yesterday and dose was adjusted.

## 2016-03-16 NOTE — Progress Notes (Signed)
Pre visit review using our clinic review tool, if applicable. No additional management support is needed unless otherwise documented below in the visit note. 

## 2016-03-16 NOTE — Progress Notes (Signed)
Subjective:    Patient ID: Bruce Cole, male    DOB: [birth year], 75 y.o.   MRN: [name]  HPI  Bruce Cole is a 75 yr old male who presents today for follow up.  1) HTN- not currently on BP medications.  BP Readings from Last 3 Encounters:  03/16/16 (!) 104/59  03/11/16 (!) 117/57  02/03/16 110/60   2) DM2- not checking home sugars, diet controlled.   Lab Results  Component Value Date   HGBA1C 5.4 02/03/2016   HGBA1C 6.7 (H) 11/04/2015   HGBA1C 6.7 (H) 08/04/2015   Lab Results  Component Value Date   MICROALBUR 20.7 (H) 12/16/2014   LDLCALC 69 02/03/2016   CREATININE 8.40 (H) 03/11/2016   Was at coumadin clinic earlier today and they adjusted his coumadin.  Scheduled for R hip replacement on Tuesday of next week.   Review of Systems See HPI  Past Medical History:  Diagnosis Date  . Acute blood loss anemia   . Acute renal failure superimposed on stage 3 chronic kidney disease ([place])   . AKI (acute kidney injury) ([place])   . Atrial fibrillation (HCC)    Chronic  . Cardiorenal disease   . CHF (congestive heart failure) ([place])   . Chronic combined systolic and diastolic heart failure ([place])   . CKD (chronic kidney disease), stage IV ([place])    hd tues/thurs/sat-[facility]  . Coagulopathy ([place])   . Colon polyp 2000  . Diabetes mellitus type 2, controlled, with complications ([place])    denies  . DM (diabetes mellitus), type 2 with renal complications ([place]) [id number]   Qualifier: Diagnosis of  By: Inda Castle FNP, [place]   . Dysrhythmia    hx  . ESRD on dialysis ([place]) 11/27/2015  . Gastritis and gastroduodenitis   . GI bleed   . Gout   . Heart murmur   . Heme positive stool   . Nephrolithiasis   . OSA (obstructive sleep apnea) 09/02/2013    IMPRESSION :  1. Mild obstructive sleep apnea with hypopneas causing sleep fragmentation and moderate oxygen desaturation.  2. Short runs of nonsustained VT were noted. His beta blocker may need to be titrated 3.  Significant PLM's were noted, the PLM arousal index was low. Please correlate with clinical history of restless leg syndrome.  4. Sleep efficiency was poor.  RECOMMENDATION:  1. Treatment options for this degree of sleep disordered breathing include weight loss and positional therapy to avoid supine sleep 2. Consider titrating beta blocker further, defer to cardiologist 3. Patient should be cautioned against driving when sleepy.They should be asked to avoid medications with sedative side effects     . Pleural effusion   . Pleural effusion on right   . Pneumonia    hx 30 yrs ago  . Primary osteoarthritis of right hip   . Rheumatoid arthritis ([place])   . S/P dialysis catheter insertion ([place])   . Supratherapeutic INR   . Symptomatic anemia   . Tachypnea   . Thrombocytopenia ([place])   . Unspecified essential hypertension      Social History   Social History  . Marital status: Single    Spouse name: N/A  . Number of children: 3  . Years of education: N/A   Occupational History  . retired Programmer, multimedia  Retired   Social History Main Topics  . Smoking status: Never Smoker  . Smokeless tobacco: Never Used  . Alcohol use Yes     Comment: occasional  .  Drug use: No  . Sexual activity: Not on file   Other Topics Concern  . Not on file   Social History Narrative   Former [place] [employer] [place]   Admitted to [facility] 11/25/15   Widowed   Never smoked   FULL CODE    Past Surgical History:  Procedure Laterality Date  . AV FISTULA PLACEMENT Left 08/26/2015   Procedure: LEFT RADIOCEPHALIC FISTULA CREATION;  Surgeon: Rosetta Posner, MD;  Location: [place];  Service: Vascular;  Laterality: Left;  . AV FISTULA PLACEMENT Left 11/23/2015   Procedure: ARTERIOVENOUS (AV) FISTULA CREATION;  Surgeon: Rosetta Posner, MD;  Location: [place];  Service: Vascular;  Laterality: Left;  . CHOLECYSTECTOMY  1994  . CYSTOSCOPY/RETROGRADE/URETEROSCOPY/STONE EXTRACTION WITH BASKET    .  ESOPHAGOGASTRODUODENOSCOPY N/A 11/13/2015   Procedure: ESOPHAGOGASTRODUODENOSCOPY (EGD);  Surgeon: Irene Shipper, MD;  Location: [facility] ENDOSCOPY;  Service: Endoscopy;  Laterality: N/A;  . INSERTION OF DIALYSIS CATHETER Left 11/23/2015   Procedure: INSERTION OF DIALYSIS CATHETER;  Surgeon: Rosetta Posner, MD;  Location: [place];  Service: Vascular;  Laterality: Left;  . JOINT REPLACEMENT     Total L-Hip replacement, Right Knee 10/20/09  . LEFT AND RIGHT HEART CATHETERIZATION WITH CORONARY ANGIOGRAM N/A 02/22/2013   Procedure: LEFT AND RIGHT HEART CATHETERIZATION WITH CORONARY ANGIOGRAM;  Surgeon: Jolaine Artist, MD;  Location[facility] CATH LAB;  Service: Cardiovascular;  Laterality: N/A;  . LITHOTRIPSY  90's  . PERIPHERAL VASCULAR CATHETERIZATION Left 11/19/2015   Procedure: A/V/Fistulagram;  Surgeon: Conrad Wedowee, MD;  Location: [place] CV LAB;  Service: Cardiovascular;  Laterality: Left;  . SPINE SURGERY     x 2    Family History  Problem Relation Age of Onset  . Hypertension Mother   . Arthritis Mother     ?RA  . Hypertension Father     Allergies  Allergen Reactions  . Ace Inhibitors Other (See Comments)    Worsening renal insufficiency    Current Outpatient Prescriptions on File Prior to Visit  Medication Sig Dispense Refill  . acetaminophen (TYLENOL) 500 MG tablet Take 500 mg by mouth every 6 (six) hours as needed for mild pain.    [name] albuterol (PROVENTIL) (2.5 MG/3ML) 0.083% nebulizer solution Take 2.5 mg by nebulization every 4 (four) hours as needed for shortness of breath. Reported on 09/25/2015    . allopurinol (ZYLOPRIM) 100 MG tablet Take 2 tablets (200 mg total) by mouth daily. (Patient taking differently: Take 100 mg by mouth 2 (two) times daily. ) 30 tablet 0  . B Complex-C-Folic Acid (RENA-VITE RX) 1 MG TABS Take 1 tablet by mouth daily.  6  . fluticasone (FLONASE) 50 MCG/ACT nasal spray Place 2 sprays into both nostrils daily. (Patient taking differently: Place 2 sprays into both  nostrils daily as needed for allergies. ) 16 g 6  . hydroxychloroquine (PLAQUENIL) 200 MG tablet Take 200 mg by mouth 2 (two) times daily. Reported on 01/13/2016    . pantoprazole (PROTONIX) 40 MG tablet Take 1 tablet (40 mg total) by mouth 2 (two) times daily. (Patient taking differently: Take 40 mg by mouth 2 (two) times daily as needed (reflux). ) 60 tablet 0  . RENVELA 800 MG tablet Take 1 tablet by mouth 3 (three) times daily with meals.  0  . warfarin (COUMADIN) 7.5 MG tablet TAKE 1 TABLET BY MOUTH DAILY AS DIRECTED BY COUMADIN CLINIC 40 tablet 1   No current facility-administered medications on file  prior to visit.     BP (!) 104/59 (BP Location: Right Arm, Patient Position: Sitting, Cuff Size: Normal)   Pulse 65   Temp 98 F (36.7 C) (Oral)   Resp 16   Ht 6\' 5"  (1.956 m)   Wt 231 lb 12.8 oz (105.1 kg)   SpO2 99%   BMI 27.49 kg/m       Objective:   Physical Exam  Constitutional: He is oriented to person, place, and time. He appears well-developed and well-nourished. No distress.  HENT:  Head: Normocephalic and atraumatic.  Cardiovascular: Normal rate and regular rhythm.   No murmur heard. Pulmonary/Chest: Effort normal and breath sounds normal. No respiratory distress. He has no wheezes. He has no rales.  Neurological: He is alert and oriented to person, place, and time.  Skin: Skin is warm and dry.  Psychiatric: He has a normal mood and affect. His behavior is normal. Thought content normal.          Assessment & Plan:

## 2016-03-16 NOTE — Assessment & Plan Note (Signed)
Diet controlled. °Monitor. °

## 2016-03-16 NOTE — Assessment & Plan Note (Signed)
Stable off of medications.  Monitor.

## 2016-03-16 NOTE — Assessment & Plan Note (Signed)
On coumadin, rate controlled.  Coumadin managed by coumadin clinic.

## 2016-03-17 DIAGNOSIS — Z992 Dependence on renal dialysis: Secondary | ICD-10-CM | POA: Diagnosis not present

## 2016-03-17 DIAGNOSIS — D509 Iron deficiency anemia, unspecified: Secondary | ICD-10-CM | POA: Diagnosis not present

## 2016-03-17 DIAGNOSIS — D631 Anemia in chronic kidney disease: Secondary | ICD-10-CM | POA: Diagnosis not present

## 2016-03-17 DIAGNOSIS — I158 Other secondary hypertension: Secondary | ICD-10-CM | POA: Diagnosis not present

## 2016-03-17 DIAGNOSIS — N186 End stage renal disease: Secondary | ICD-10-CM | POA: Diagnosis not present

## 2016-03-17 DIAGNOSIS — I4891 Unspecified atrial fibrillation: Secondary | ICD-10-CM | POA: Diagnosis not present

## 2016-03-17 DIAGNOSIS — N2581 Secondary hyperparathyroidism of renal origin: Secondary | ICD-10-CM | POA: Diagnosis not present

## 2016-03-17 DIAGNOSIS — D508 Other iron deficiency anemias: Secondary | ICD-10-CM | POA: Diagnosis not present

## 2016-03-18 MED ORDER — SODIUM CHLORIDE 0.9 % IV SOLN
1000.0000 mg | INTRAVENOUS | Status: AC
  Administered 2016-03-22: 1000 mg via INTRAVENOUS
  Filled 2016-03-18: qty 10

## 2016-03-19 DIAGNOSIS — D631 Anemia in chronic kidney disease: Secondary | ICD-10-CM | POA: Diagnosis not present

## 2016-03-19 DIAGNOSIS — Z23 Encounter for immunization: Secondary | ICD-10-CM | POA: Diagnosis not present

## 2016-03-19 DIAGNOSIS — I4891 Unspecified atrial fibrillation: Secondary | ICD-10-CM | POA: Diagnosis not present

## 2016-03-19 DIAGNOSIS — D509 Iron deficiency anemia, unspecified: Secondary | ICD-10-CM | POA: Diagnosis not present

## 2016-03-19 DIAGNOSIS — N2581 Secondary hyperparathyroidism of renal origin: Secondary | ICD-10-CM | POA: Diagnosis not present

## 2016-03-19 DIAGNOSIS — N186 End stage renal disease: Secondary | ICD-10-CM | POA: Diagnosis not present

## 2016-03-21 DIAGNOSIS — N186 End stage renal disease: Secondary | ICD-10-CM | POA: Diagnosis not present

## 2016-03-21 DIAGNOSIS — D631 Anemia in chronic kidney disease: Secondary | ICD-10-CM | POA: Diagnosis not present

## 2016-03-21 DIAGNOSIS — I4891 Unspecified atrial fibrillation: Secondary | ICD-10-CM | POA: Diagnosis not present

## 2016-03-21 DIAGNOSIS — N2581 Secondary hyperparathyroidism of renal origin: Secondary | ICD-10-CM | POA: Diagnosis not present

## 2016-03-21 DIAGNOSIS — D509 Iron deficiency anemia, unspecified: Secondary | ICD-10-CM | POA: Diagnosis not present

## 2016-03-21 DIAGNOSIS — Z23 Encounter for immunization: Secondary | ICD-10-CM | POA: Diagnosis not present

## 2016-03-22 ENCOUNTER — Encounter (HOSPITAL_COMMUNITY): Payer: Self-pay | Admitting: Urology

## 2016-03-22 ENCOUNTER — Inpatient Hospital Stay (HOSPITAL_COMMUNITY): Payer: Medicare Other

## 2016-03-22 ENCOUNTER — Inpatient Hospital Stay (HOSPITAL_COMMUNITY)
Admission: RE | Admit: 2016-03-22 | Discharge: 2016-03-25 | DRG: 469 | Disposition: A | Discharge status: Skilled Nursing Facility | Payer: Medicare Other | Source: Ambulatory Visit | Attending: Orthopaedic Surgery | Admitting: Orthopaedic Surgery

## 2016-03-22 ENCOUNTER — Inpatient Hospital Stay (HOSPITAL_COMMUNITY): Payer: Medicare Other | Admitting: Anesthesiology

## 2016-03-22 ENCOUNTER — Inpatient Hospital Stay (HOSPITAL_COMMUNITY): Payer: Medicare Other | Admitting: Vascular Surgery

## 2016-03-22 ENCOUNTER — Encounter (HOSPITAL_COMMUNITY)
Admission: RE | Disposition: A | Discharge status: Skilled Nursing Facility | Payer: Self-pay | Source: Ambulatory Visit | Attending: Orthopaedic Surgery

## 2016-03-22 DIAGNOSIS — R278 Other lack of coordination: Secondary | ICD-10-CM | POA: Diagnosis present

## 2016-03-22 DIAGNOSIS — I132 Hypertensive heart and chronic kidney disease with heart failure and with stage 5 chronic kidney disease, or end stage renal disease: Secondary | ICD-10-CM | POA: Diagnosis present

## 2016-03-22 DIAGNOSIS — N186 End stage renal disease: Secondary | ICD-10-CM | POA: Diagnosis present

## 2016-03-22 DIAGNOSIS — E8770 Fluid overload, unspecified: Secondary | ICD-10-CM | POA: Diagnosis not present

## 2016-03-22 DIAGNOSIS — Z888 Allergy status to other drugs, medicaments and biological substances status: Secondary | ICD-10-CM

## 2016-03-22 DIAGNOSIS — D62 Acute posthemorrhagic anemia: Secondary | ICD-10-CM | POA: Diagnosis not present

## 2016-03-22 DIAGNOSIS — I4891 Unspecified atrial fibrillation: Secondary | ICD-10-CM | POA: Diagnosis present

## 2016-03-22 DIAGNOSIS — Z96651 Presence of right artificial knee joint: Secondary | ICD-10-CM | POA: Diagnosis present

## 2016-03-22 DIAGNOSIS — Z471 Aftercare following joint replacement surgery: Secondary | ICD-10-CM | POA: Diagnosis not present

## 2016-03-22 DIAGNOSIS — G4733 Obstructive sleep apnea (adult) (pediatric): Secondary | ICD-10-CM | POA: Diagnosis present

## 2016-03-22 DIAGNOSIS — D631 Anemia in chronic kidney disease: Secondary | ICD-10-CM | POA: Diagnosis present

## 2016-03-22 DIAGNOSIS — I482 Chronic atrial fibrillation: Secondary | ICD-10-CM | POA: Diagnosis not present

## 2016-03-22 DIAGNOSIS — Z9049 Acquired absence of other specified parts of digestive tract: Secondary | ICD-10-CM

## 2016-03-22 DIAGNOSIS — I5042 Chronic combined systolic (congestive) and diastolic (congestive) heart failure: Secondary | ICD-10-CM | POA: Diagnosis present

## 2016-03-22 DIAGNOSIS — Z79899 Other long term (current) drug therapy: Secondary | ICD-10-CM | POA: Diagnosis not present

## 2016-03-22 DIAGNOSIS — M25559 Pain in unspecified hip: Secondary | ICD-10-CM | POA: Diagnosis not present

## 2016-03-22 DIAGNOSIS — Z8249 Family history of ischemic heart disease and other diseases of the circulatory system: Secondary | ICD-10-CM

## 2016-03-22 DIAGNOSIS — M1611 Unilateral primary osteoarthritis, right hip: Principal | ICD-10-CM

## 2016-03-22 DIAGNOSIS — M069 Rheumatoid arthritis, unspecified: Secondary | ICD-10-CM | POA: Diagnosis present

## 2016-03-22 DIAGNOSIS — Z96641 Presence of right artificial hip joint: Secondary | ICD-10-CM | POA: Diagnosis not present

## 2016-03-22 DIAGNOSIS — Z7901 Long term (current) use of anticoagulants: Secondary | ICD-10-CM | POA: Diagnosis not present

## 2016-03-22 DIAGNOSIS — Z96642 Presence of left artificial hip joint: Secondary | ICD-10-CM | POA: Diagnosis present

## 2016-03-22 DIAGNOSIS — M109 Gout, unspecified: Secondary | ICD-10-CM | POA: Diagnosis present

## 2016-03-22 DIAGNOSIS — I120 Hypertensive chronic kidney disease with stage 5 chronic kidney disease or end stage renal disease: Secondary | ICD-10-CM | POA: Diagnosis not present

## 2016-03-22 DIAGNOSIS — E1122 Type 2 diabetes mellitus with diabetic chronic kidney disease: Secondary | ICD-10-CM | POA: Diagnosis present

## 2016-03-22 DIAGNOSIS — M6281 Muscle weakness (generalized): Secondary | ICD-10-CM | POA: Diagnosis present

## 2016-03-22 DIAGNOSIS — M25551 Pain in right hip: Secondary | ICD-10-CM | POA: Diagnosis not present

## 2016-03-22 DIAGNOSIS — Z992 Dependence on renal dialysis: Secondary | ICD-10-CM | POA: Diagnosis not present

## 2016-03-22 DIAGNOSIS — Z419 Encounter for procedure for purposes other than remedying health state, unspecified: Secondary | ICD-10-CM

## 2016-03-22 DIAGNOSIS — M169 Osteoarthritis of hip, unspecified: Secondary | ICD-10-CM | POA: Diagnosis not present

## 2016-03-22 DIAGNOSIS — Z4789 Encounter for other orthopedic aftercare: Secondary | ICD-10-CM | POA: Diagnosis present

## 2016-03-22 DIAGNOSIS — R2689 Other abnormalities of gait and mobility: Secondary | ICD-10-CM | POA: Diagnosis present

## 2016-03-22 HISTORY — DX: Dependence on renal dialysis: Z99.2

## 2016-03-22 HISTORY — DX: Essential (primary) hypertension: I10

## 2016-03-22 HISTORY — DX: Dependence on renal dialysis: N18.6

## 2016-03-22 HISTORY — PX: TOTAL HIP ARTHROPLASTY: SHX124

## 2016-03-22 LAB — POCT I-STAT 4, (NA,K, GLUC, HGB,HCT)
Glucose, Bld: 103 mg/dL — ABNORMAL HIGH (ref 65–99)
HEMATOCRIT: 36 % — AB (ref 39.0–52.0)
HEMOGLOBIN: 12.2 g/dL — AB (ref 13.0–17.0)
POTASSIUM: 5 mmol/L (ref 3.5–5.1)
SODIUM: 132 mmol/L — AB (ref 135–145)

## 2016-03-22 LAB — GLUCOSE, CAPILLARY: Glucose-Capillary: 119 mg/dL — ABNORMAL HIGH (ref 65–99)

## 2016-03-22 LAB — PROTIME-INR
INR: 1.24
Prothrombin Time: 15.7 seconds — ABNORMAL HIGH (ref 11.4–15.2)

## 2016-03-22 SURGERY — ARTHROPLASTY, HIP, TOTAL, ANTERIOR APPROACH
Anesthesia: General | Laterality: Right

## 2016-03-22 MED ORDER — SODIUM CHLORIDE 0.9 % IR SOLN
Status: DC | PRN
  Administered 2016-03-22: 3000 mL

## 2016-03-22 MED ORDER — 0.9 % SODIUM CHLORIDE (POUR BTL) OPTIME
TOPICAL | Status: DC | PRN
  Administered 2016-03-22: 1000 mL

## 2016-03-22 MED ORDER — LIDOCAINE HCL (CARDIAC) 20 MG/ML IV SOLN
INTRAVENOUS | Status: DC | PRN
  Administered 2016-03-22: 100 mg via INTRAVENOUS

## 2016-03-22 MED ORDER — FENTANYL CITRATE (PF) 100 MCG/2ML IJ SOLN
INTRAMUSCULAR | Status: DC | PRN
  Administered 2016-03-22 (×6): 50 ug via INTRAVENOUS

## 2016-03-22 MED ORDER — PHENYLEPHRINE 40 MCG/ML (10ML) SYRINGE FOR IV PUSH (FOR BLOOD PRESSURE SUPPORT)
PREFILLED_SYRINGE | INTRAVENOUS | Status: AC
  Filled 2016-03-22: qty 10

## 2016-03-22 MED ORDER — HYDROXYCHLOROQUINE SULFATE 200 MG PO TABS
200.0000 mg | ORAL_TABLET | Freq: Two times a day (BID) | ORAL | Status: DC
  Administered 2016-03-22 – 2016-03-25 (×6): 200 mg via ORAL
  Filled 2016-03-22 (×6): qty 1

## 2016-03-22 MED ORDER — MENTHOL 3 MG MT LOZG
1.0000 | LOZENGE | OROMUCOSAL | Status: DC | PRN
  Filled 2016-03-22: qty 9

## 2016-03-22 MED ORDER — HYDROMORPHONE HCL 1 MG/ML IJ SOLN
INTRAMUSCULAR | Status: AC
  Filled 2016-03-22: qty 1

## 2016-03-22 MED ORDER — SUCCINYLCHOLINE CHLORIDE 200 MG/10ML IV SOSY
PREFILLED_SYRINGE | INTRAVENOUS | Status: AC
  Filled 2016-03-22: qty 10

## 2016-03-22 MED ORDER — METHOCARBAMOL 1000 MG/10ML IJ SOLN
500.0000 mg | Freq: Four times a day (QID) | INTRAVENOUS | Status: DC | PRN
  Filled 2016-03-22: qty 5

## 2016-03-22 MED ORDER — LIDOCAINE 2% (20 MG/ML) 5 ML SYRINGE
INTRAMUSCULAR | Status: AC
  Filled 2016-03-22: qty 5

## 2016-03-22 MED ORDER — ACETAMINOPHEN 650 MG RE SUPP: 650.0000 mg | Freq: Four times a day (QID) | RECTAL | Status: DC | PRN

## 2016-03-22 MED ORDER — DIPHENHYDRAMINE HCL 12.5 MG/5ML PO ELIX: 12.5000 mg | ORAL_SOLUTION | ORAL | Status: DC | PRN

## 2016-03-22 MED ORDER — DOCUSATE SODIUM 100 MG PO CAPS
100.0000 mg | ORAL_CAPSULE | Freq: Two times a day (BID) | ORAL | Status: DC
Start: 2016-03-22 — End: 2016-03-25
  Administered 2016-03-22 – 2016-03-25 (×6): 100 mg via ORAL
  Filled 2016-03-22 (×6): qty 1

## 2016-03-22 MED ORDER — ALBUTEROL SULFATE (2.5 MG/3ML) 0.083% IN NEBU: 2.5000 mg | INHALATION_SOLUTION | RESPIRATORY_TRACT | Status: DC | PRN

## 2016-03-22 MED ORDER — ONDANSETRON HCL 4 MG/2ML IJ SOLN: 4.0000 mg | Freq: Once | INTRAMUSCULAR | Status: DC | PRN

## 2016-03-22 MED ORDER — ROCURONIUM BROMIDE 100 MG/10ML IV SOLN
INTRAVENOUS | Status: DC | PRN
  Administered 2016-03-22: 20 mg via INTRAVENOUS

## 2016-03-22 MED ORDER — CEFAZOLIN SODIUM-DEXTROSE 2-4 GM/100ML-% IV SOLN
2.0000 g | INTRAVENOUS | Status: AC
  Administered 2016-03-22: 2 g via INTRAVENOUS

## 2016-03-22 MED ORDER — CEFAZOLIN SODIUM-DEXTROSE 2-4 GM/100ML-% IV SOLN
INTRAVENOUS | Status: AC
  Filled 2016-03-22: qty 100

## 2016-03-22 MED ORDER — NEOSTIGMINE METHYLSULFATE 5 MG/5ML IV SOSY
PREFILLED_SYRINGE | INTRAVENOUS | Status: AC
  Filled 2016-03-22: qty 5

## 2016-03-22 MED ORDER — HYDROMORPHONE HCL 1 MG/ML IJ SOLN
0.2500 mg | INTRAMUSCULAR | Status: DC | PRN
  Administered 2016-03-22 (×4): 0.5 mg via INTRAVENOUS

## 2016-03-22 MED ORDER — PROPOFOL 10 MG/ML IV BOLUS
INTRAVENOUS | Status: DC | PRN
  Administered 2016-03-22: 50 mg via INTRAVENOUS
  Administered 2016-03-22: 90 mg via INTRAVENOUS

## 2016-03-22 MED ORDER — ONDANSETRON HCL 4 MG/2ML IJ SOLN
INTRAMUSCULAR | Status: AC
Start: 2016-03-22 — End: 2016-03-22
  Filled 2016-03-22: qty 2

## 2016-03-22 MED ORDER — WARFARIN SODIUM 7.5 MG PO TABS
7.5000 mg | ORAL_TABLET | Freq: Every day | ORAL | Status: DC
  Administered 2016-03-22 – 2016-03-24 (×3): 7.5 mg via ORAL
  Filled 2016-03-22 (×3): qty 1

## 2016-03-22 MED ORDER — NEOSTIGMINE METHYLSULFATE 10 MG/10ML IV SOLN
INTRAVENOUS | Status: DC | PRN
  Administered 2016-03-22: 2 mg via INTRAVENOUS

## 2016-03-22 MED ORDER — SUCCINYLCHOLINE CHLORIDE 20 MG/ML IJ SOLN
INTRAMUSCULAR | Status: DC | PRN
  Administered 2016-03-22: 100 mg via INTRAVENOUS

## 2016-03-22 MED ORDER — WARFARIN - PHYSICIAN DOSING INPATIENT
Freq: Every day | Status: DC
Start: 2016-03-22 — End: 2016-03-25
  Administered 2016-03-22 – 2016-03-23 (×2)

## 2016-03-22 MED ORDER — SODIUM CHLORIDE 0.9 % IV SOLN
INTRAVENOUS | Status: DC
  Administered 2016-03-22 (×2): via INTRAVENOUS

## 2016-03-22 MED ORDER — RENA-VITE RX 1 MG PO TABS: 1.0000 | ORAL_TABLET | Freq: Every day | ORAL | Status: DC

## 2016-03-22 MED ORDER — SEVELAMER CARBONATE 800 MG PO TABS
800.0000 mg | ORAL_TABLET | Freq: Three times a day (TID) | ORAL | Status: DC
  Administered 2016-03-23 – 2016-03-25 (×7): 800 mg via ORAL
  Filled 2016-03-22 (×6): qty 1

## 2016-03-22 MED ORDER — CHLORHEXIDINE GLUCONATE 4 % EX LIQD: 60.0000 mL | Freq: Once | CUTANEOUS | Status: DC

## 2016-03-22 MED ORDER — ONDANSETRON HCL 4 MG/2ML IJ SOLN
INTRAMUSCULAR | Status: DC | PRN
  Administered 2016-03-22: 4 mg via INTRAVENOUS

## 2016-03-22 MED ORDER — ROCURONIUM BROMIDE 10 MG/ML (PF) SYRINGE
PREFILLED_SYRINGE | INTRAVENOUS | Status: AC
  Filled 2016-03-22: qty 10

## 2016-03-22 MED ORDER — HYDROMORPHONE HCL 1 MG/ML IJ SOLN
1.0000 mg | INTRAMUSCULAR | Status: DC | PRN
  Administered 2016-03-23: 1 mg via INTRAVENOUS
  Filled 2016-03-22 (×2): qty 1

## 2016-03-22 MED ORDER — PHENOL 1.4 % MT LIQD: 1.0000 | OROMUCOSAL | Status: DC | PRN

## 2016-03-22 MED ORDER — SODIUM CHLORIDE 0.9 % IV SOLN
INTRAVENOUS | Status: DC
  Administered 2016-03-22: 18:00:00 via INTRAVENOUS

## 2016-03-22 MED ORDER — CEFAZOLIN IN D5W 1 GM/50ML IV SOLN
1.0000 g | Freq: Four times a day (QID) | INTRAVENOUS | Status: AC
  Administered 2016-03-22 – 2016-03-23 (×2): 1 g via INTRAVENOUS
  Filled 2016-03-22 (×2): qty 50

## 2016-03-22 MED ORDER — PHENYLEPHRINE HCL 10 MG/ML IJ SOLN
INTRAMUSCULAR | Status: DC | PRN
  Administered 2016-03-22: 80 ug via INTRAVENOUS

## 2016-03-22 MED ORDER — FENTANYL CITRATE (PF) 100 MCG/2ML IJ SOLN
INTRAMUSCULAR | Status: AC
  Filled 2016-03-22: qty 2

## 2016-03-22 MED ORDER — METHOCARBAMOL 500 MG PO TABS
500.0000 mg | ORAL_TABLET | Freq: Four times a day (QID) | ORAL | Status: DC | PRN
  Administered 2016-03-22 – 2016-03-24 (×7): 500 mg via ORAL
  Filled 2016-03-22 (×8): qty 1

## 2016-03-22 MED ORDER — ONDANSETRON HCL 4 MG PO TABS
4.0000 mg | ORAL_TABLET | Freq: Four times a day (QID) | ORAL | Status: DC | PRN
  Administered 2016-03-24: 4 mg via ORAL
  Filled 2016-03-22: qty 1

## 2016-03-22 MED ORDER — NOREPINEPHRINE BITARTRATE 1 MG/ML IV SOLN
0.0000 ug/min | INTRAVENOUS | Status: DC
  Administered 2016-03-22: 1 ug/min via INTRAVENOUS
  Filled 2016-03-22: qty 4

## 2016-03-22 MED ORDER — ZOLPIDEM TARTRATE 5 MG PO TABS: 5.0000 mg | ORAL_TABLET | Freq: Every evening | ORAL | Status: DC | PRN

## 2016-03-22 MED ORDER — HYDROCODONE-ACETAMINOPHEN 7.5-325 MG PO TABS: 1.0000 | ORAL_TABLET | Freq: Once | ORAL | Status: DC | PRN

## 2016-03-22 MED ORDER — GLYCOPYRROLATE 0.2 MG/ML IV SOSY
PREFILLED_SYRINGE | INTRAVENOUS | Status: AC
  Filled 2016-03-22: qty 3

## 2016-03-22 MED ORDER — ALLOPURINOL 100 MG PO TABS
100.0000 mg | ORAL_TABLET | Freq: Two times a day (BID) | ORAL | Status: DC
  Administered 2016-03-22 – 2016-03-25 (×6): 100 mg via ORAL
  Filled 2016-03-22 (×6): qty 1

## 2016-03-22 MED ORDER — METOCLOPRAMIDE HCL 5 MG/ML IJ SOLN: 5.0000 mg | Freq: Three times a day (TID) | INTRAMUSCULAR | Status: DC | PRN

## 2016-03-22 MED ORDER — METOCLOPRAMIDE HCL 5 MG PO TABS: 5.0000 mg | ORAL_TABLET | Freq: Three times a day (TID) | ORAL | Status: DC | PRN

## 2016-03-22 MED ORDER — PROPOFOL 10 MG/ML IV BOLUS
INTRAVENOUS | Status: AC
  Filled 2016-03-22: qty 20

## 2016-03-22 MED ORDER — ONDANSETRON HCL 4 MG/2ML IJ SOLN: 4.0000 mg | Freq: Four times a day (QID) | INTRAMUSCULAR | Status: DC | PRN

## 2016-03-22 MED ORDER — RENA-VITE PO TABS
1.0000 | ORAL_TABLET | Freq: Every day | ORAL | Status: DC
  Administered 2016-03-22 – 2016-03-24 (×3): 1 via ORAL
  Filled 2016-03-22 (×3): qty 1

## 2016-03-22 MED ORDER — OXYCODONE HCL 5 MG PO TABS
5.0000 mg | ORAL_TABLET | ORAL | Status: DC | PRN
  Administered 2016-03-22 – 2016-03-25 (×14): 10 mg via ORAL
  Filled 2016-03-22 (×13): qty 2

## 2016-03-22 MED ORDER — GLYCOPYRROLATE 0.2 MG/ML IJ SOLN
INTRAMUSCULAR | Status: DC | PRN
  Administered 2016-03-22: .3 mg via INTRAVENOUS

## 2016-03-22 MED ORDER — FENTANYL CITRATE (PF) 100 MCG/2ML IJ SOLN
INTRAMUSCULAR | Status: AC
  Filled 2016-03-22: qty 4

## 2016-03-22 MED ORDER — ACETAMINOPHEN 325 MG PO TABS: 650.0000 mg | ORAL_TABLET | Freq: Four times a day (QID) | ORAL | Status: DC | PRN

## 2016-03-22 MED ORDER — ALBUMIN HUMAN 5 % IV SOLN
INTRAVENOUS | Status: DC | PRN
  Administered 2016-03-22: 15:00:00 via INTRAVENOUS

## 2016-03-22 SURGICAL SUPPLY — 53 items
BENZOIN TINCTURE PRP APPL 2/3 (GAUZE/BANDAGES/DRESSINGS) ×3 IMPLANT
BLADE SAW SGTL 18X1.27X75 (BLADE) ×2 IMPLANT
BLADE SAW SGTL 18X1.27X75MM (BLADE) ×1
BLADE SURG ROTATE 9660 (MISCELLANEOUS) IMPLANT
CAPT HIP TOTAL 2 ×3 IMPLANT
CELLS DAT CNTRL 66122 CELL SVR (MISCELLANEOUS) ×1 IMPLANT
CLOSURE STERI-STRIP 1/2X4 (GAUZE/BANDAGES/DRESSINGS) ×1
CLOSURE WOUND 1/2 X4 (GAUZE/BANDAGES/DRESSINGS) ×2
CLSR STERI-STRIP ANTIMIC 1/2X4 (GAUZE/BANDAGES/DRESSINGS) ×2 IMPLANT
COVER SURGICAL LIGHT HANDLE (MISCELLANEOUS) ×3 IMPLANT
DRAPE C-ARM 42X72 X-RAY (DRAPES) ×3 IMPLANT
DRAPE STERI IOBAN 125X83 (DRAPES) ×3 IMPLANT
DRAPE U-SHAPE 47X51 STRL (DRAPES) ×9 IMPLANT
DRSG AQUACEL AG ADV 3.5X10 (GAUZE/BANDAGES/DRESSINGS) ×3 IMPLANT
DURAPREP 26ML APPLICATOR (WOUND CARE) ×3 IMPLANT
ELECT BLADE 4.0 EZ CLEAN MEGAD (MISCELLANEOUS) ×3
ELECT BLADE 6.5 EXT (BLADE) ×3 IMPLANT
ELECT REM PT RETURN 9FT ADLT (ELECTROSURGICAL) ×3
ELECTRODE BLDE 4.0 EZ CLN MEGD (MISCELLANEOUS) ×1 IMPLANT
ELECTRODE REM PT RTRN 9FT ADLT (ELECTROSURGICAL) ×1 IMPLANT
FACESHIELD WRAPAROUND (MASK) ×9 IMPLANT
GLOVE BIOGEL PI IND STRL 8 (GLOVE) ×2 IMPLANT
GLOVE BIOGEL PI INDICATOR 8 (GLOVE) ×4
GLOVE ECLIPSE 8.0 STRL XLNG CF (GLOVE) ×3 IMPLANT
GLOVE ORTHO TXT STRL SZ7.5 (GLOVE) ×6 IMPLANT
GOWN STRL REUS W/ TWL LRG LVL3 (GOWN DISPOSABLE) ×2 IMPLANT
GOWN STRL REUS W/ TWL XL LVL3 (GOWN DISPOSABLE) ×2 IMPLANT
GOWN STRL REUS W/TWL LRG LVL3 (GOWN DISPOSABLE) ×4
GOWN STRL REUS W/TWL XL LVL3 (GOWN DISPOSABLE) ×4
HANDPIECE INTERPULSE COAX TIP (DISPOSABLE) ×2
KIT BASIN OR (CUSTOM PROCEDURE TRAY) ×3 IMPLANT
KIT ROOM TURNOVER OR (KITS) ×3 IMPLANT
MANIFOLD NEPTUNE II (INSTRUMENTS) ×3 IMPLANT
NS IRRIG 1000ML POUR BTL (IV SOLUTION) ×3 IMPLANT
PACK TOTAL JOINT (CUSTOM PROCEDURE TRAY) ×3 IMPLANT
PAD ARMBOARD 7.5X6 YLW CONV (MISCELLANEOUS) ×3 IMPLANT
RTRCTR WOUND ALEXIS 18CM MED (MISCELLANEOUS) ×3
SET HNDPC FAN SPRY TIP SCT (DISPOSABLE) ×1 IMPLANT
STAPLER VISISTAT 35W (STAPLE) IMPLANT
STRIP CLOSURE SKIN 1/2X4 (GAUZE/BANDAGES/DRESSINGS) ×4 IMPLANT
SUT ETHIBOND NAB CT1 #1 30IN (SUTURE) ×3 IMPLANT
SUT MNCRL AB 4-0 PS2 18 (SUTURE) IMPLANT
SUT VIC AB 0 CT1 27 (SUTURE) ×2
SUT VIC AB 0 CT1 27XBRD ANBCTR (SUTURE) ×1 IMPLANT
SUT VIC AB 1 CT1 27 (SUTURE) ×2
SUT VIC AB 1 CT1 27XBRD ANBCTR (SUTURE) ×1 IMPLANT
SUT VIC AB 2-0 CT1 27 (SUTURE) ×2
SUT VIC AB 2-0 CT1 TAPERPNT 27 (SUTURE) ×1 IMPLANT
TOWEL OR 17X24 6PK STRL BLUE (TOWEL DISPOSABLE) ×3 IMPLANT
TOWEL OR 17X26 10 PK STRL BLUE (TOWEL DISPOSABLE) ×3 IMPLANT
TRAY FOLEY CATH 16FRSI W/METER (SET/KITS/TRAYS/PACK) IMPLANT
WATER STERILE IRR 1000ML POUR (IV SOLUTION) IMPLANT
YANKAUER SUCT BULB TIP NO VENT (SUCTIONS) ×3 IMPLANT

## 2016-03-22 NOTE — Brief Op Note (Signed)
03/22/2016  3:08 PM  PATIENT:  Jori Moll Aboud  75 y.o. male  PRE-OPERATIVE DIAGNOSIS:  severe osteoarthritis right hip  POST-OPERATIVE DIAGNOSIS:  severe osteoarthritis right hip  PROCEDURE:  Procedure(s): RIGHT TOTAL HIP ARTHROPLASTY ANTERIOR APPROACH (Right)  SURGEON:  Surgeon(s) and Role:    * Mcarthur Rossetti, MD - Primary  PHYSICIAN ASSISTANT: Benita Stabile, PA-C  ANESTHESIA:   general  EBL:  Total I/O In: 750 [I.V.:500; IV Piggyback:250] Out: 700 [Blood:700]  COUNTS:  YES  PLAN OF CARE: Admit to inpatient   PATIENT DISPOSITION:  PACU - hemodynamically stable.   Delay start of Pharmacological VTE agent (>24hrs) due to surgical blood loss or risk of bleeding: no

## 2016-03-22 NOTE — Anesthesia Preprocedure Evaluation (Addendum)
Anesthesia Evaluation  Patient identified by MRN, date of birth, ID band Patient awake    Reviewed: Allergy & Precautions, H&P , NPO status , Patient's Chart, lab work & pertinent test results, reviewed documented beta blocker date and time   History of Anesthesia Complications Negative for: history of anesthetic complications  Airway Mallampati: III  TM Distance: >3 FB Neck ROM: full    Dental  (+) Poor Dentition, Chipped,    Pulmonary shortness of breath, sleep apnea ,    Pulmonary exam normal breath sounds clear to auscultation       Cardiovascular hypertension, Pt. on medications and Pt. on home beta blockers +CHF  + dysrhythmias Atrial Fibrillation  Rhythm:Irregular Rate:Bradycardia  Echo 1/17: Study Conclusions  - Left ventricle: The cavity size was normal. There was moderate concentric hypertrophy. Systolic function was mildly to moderately reduced. The estimated ejection fraction was in the range of 40% to 45%. Diffuse hypokinesis. - Ventricular septum: The contour showed diastolic flattening. - Aortic valve: Trileaflet; moderately thickened, moderately  calcified leaflets. - Aorta: Aortic root dimension: 39 mm (ED). - Aortic root: The aortic root was mildly dilated. - Mitral valve: Moderately calcified annulus. - Left atrium: The atrium was moderately dilated. - Right ventricle: The cavity size was severely dilated. Wall thickness was normal. Systolic function was mildly to moderately reduced. - Right atrium: The atrium was severely dilated. - Tricuspid valve: There was severe regurgitation. - Pulmonary arteries: Systolic pressure was mildly to moderately increased. PA peak pressure: 44 mm Hg (S).   Neuro/Psych negative neurological ROS  negative psych ROS   GI/Hepatic Neg liver ROS, Gastritis with likely UGIB.  Improved per EGD report. Has received 8 pRBCs (6 on 11/21/15) during this hospitalization.   Endo/Other   diabetes  Renal/GU Dialysis and ESRFRenal disease     Musculoskeletal  (+) Arthritis ,   Abdominal   Peds  Hematology  (+) Blood dyscrasia, anemia ,   Anesthesia Other Findings Cardiology eval by Dr. Missy Sabins, feels is mild-moderate risk for cardiac issues with THR.[name] Has been on coumadin, has severe RV dysfunction with severely elevated pulmonary pressures  Reproductive/Obstetrics negative OB ROS                            Anesthesia Physical  Anesthesia Plan  ASA: III  Anesthesia Plan: General   Post-op Pain Management:    Induction: Intravenous  Airway Management Planned: Oral ETT  Additional Equipment:   Intra-op Plan:   Post-operative Plan: Extubation in OR  Informed Consent: I have reviewed the patients History and Physical, chart, labs and discussed the procedure including the risks, benefits and alternatives for the proposed anesthesia with the patient or authorized representative who has indicated his/her understanding and acceptance.   Dental advisory given  Plan Discussed with: CRNA  Anesthesia Plan Comments: (Recommend Norepinephrine for right heart support, GA with ETT)       Anesthesia Quick Evaluation

## 2016-03-22 NOTE — Transfer of Care (Signed)
Immediate Anesthesia Transfer of Care Note  Patient: Bruce Cole  Procedure(s) Performed: Procedure(s): RIGHT TOTAL HIP ARTHROPLASTY ANTERIOR APPROACH (Right)  Patient Location: PACU  Anesthesia Type:General  Level of Consciousness: awake, alert  and oriented  Airway & Oxygen Therapy: Patient Spontanous Breathing and Patient connected to nasal cannula oxygen  Post-op Assessment: Report given to RN  Post vital signs: Reviewed and stable  Last Vitals:  Vitals:   03/22/16 0935  BP: 124/64  Pulse: 65  Resp: 16  Temp: 36.7 C    Last Pain:  Vitals:   03/22/16 0952  TempSrc:   PainSc: 10-Worst pain ever         Complications: No apparent anesthesia complications

## 2016-03-22 NOTE — H&P (Signed)
TOTAL HIP ADMISSION H&P  Patient is admitted for right total hip arthroplasty.  Subjective:  Chief Complaint: right hip pain  HPI: Bruce Cole, 75 y.o. male, has a history of pain and functional disability in the right hip(s) due to arthritis and patient has failed non-surgical conservative treatments for greater than 12 weeks to include NSAID's and/or analgesics, corticosteriod injections, flexibility and strengthening excercises, use of assistive devices, weight reduction as appropriate and activity modification.  Onset of symptoms was gradual starting 5 years ago with gradually worsening course since that time.The patient noted no past surgery on the right hip(s).  Patient currently rates pain in the right hip at 10 out of 10 with activity. Patient has night pain, worsening of pain with activity and weight bearing, trendelenberg gait, pain that interfers with activities of daily living, pain with passive range of motion and crepitus. Patient has evidence of subchondral cysts, subchondral sclerosis, periarticular osteophytes and joint space narrowing by imaging studies. This condition presents safety issues increasing the risk of falls.  There is no current active infection.  Patient Active Problem List   Diagnosis Date Noted  . Osteoarthritis of right hip 03/22/2016  . Pre-operative cardiovascular examination 01/19/2016  . ESRD on dialysis ([place]) 11/27/2015  . Debility 11/27/2015  . Acute blood loss anemia   . Primary osteoarthritis of right hip   . S/P dialysis catheter insertion ([place])   . Gastritis and gastroduodenitis   . S/P thoracentesis   . Pleural effusion on right   . Encounter for therapeutic drug monitoring 09/02/2013  . OSA (obstructive sleep apnea) 09/02/2013  . Chronic combined systolic and diastolic heart failure ([place]) 03/16/2013  . Allergic rhinitis 12/18/2012  . Long term (current) use of anticoagulants 11/25/2010  . Genital herpes 05/31/2010  . DM (diabetes  mellitus), type 2 with renal complications ([place]) 0000000  . ERECTILE DYSFUNCTION, ORGANIC 09/21/2009  . Osteoarthritis 09/21/2009  . PERSONAL HX COLONIC POLYPS 08/25/2009  . Gout 07/22/2009  . Essential hypertension 07/22/2009  . ATRIAL FIBRILLATION 07/22/2009  . Rheumatoid arthritis ([place]) 07/22/2009  . NEPHROLITHIASIS, HX OF 07/22/2009   Past Medical History:  Diagnosis Date  . Acute blood loss anemia   . Acute renal failure superimposed on stage 3 chronic kidney disease ([place])   . AKI (acute kidney injury) ([place])   . Atrial fibrillation (HCC)    Chronic  . Cardiorenal disease   . CHF (congestive heart failure) ([place])   . Chronic combined systolic and diastolic heart failure ([place])   . CKD (chronic kidney disease), stage IV ([place])    hd tues/thurs/sat-[place]  . Coagulopathy ([place])   . Colon polyp 2000  . Diabetes mellitus type 2, controlled, with complications ([place])    denies  . DM (diabetes mellitus), type 2 with renal complications ([place]) [id number]   Qualifier: Diagnosis of  By: Inda Castle FNP, [place]   . Dysrhythmia    hx  . ESRD on dialysis ([place]) 11/27/2015  . Gastritis and gastroduodenitis   . GI bleed   . Gout   . Heart murmur   . Heme positive stool   . Nephrolithiasis   . OSA (obstructive sleep apnea) 09/02/2013    IMPRESSION :  1. Mild obstructive sleep apnea with hypopneas causing sleep fragmentation and moderate oxygen desaturation.  2. Short runs of nonsustained VT were noted. His beta blocker may need to be titrated 3. Significant PLM's were noted, the PLM arousal index was low. Please correlate with clinical history of restless  leg syndrome.  4. Sleep efficiency was poor.  RECOMMENDATION:  1. Treatment options for this degree of sleep disordered breathing include weight loss and positional therapy to avoid supine sleep 2. Consider titrating beta blocker further, defer to cardiologist 3. Patient should be cautioned against driving when sleepy.They  should be asked to avoid medications with sedative side effects     . Pleural effusion   . Pleural effusion on right   . Pneumonia    hx 30 yrs ago  . Primary osteoarthritis of right hip   . Rheumatoid arthritis ([place])   . S/P dialysis catheter insertion ([place])   . Supratherapeutic INR   . Symptomatic anemia   . Tachypnea   . Thrombocytopenia ([place])   . Unspecified essential hypertension     Past Surgical History:  Procedure Laterality Date  . AV FISTULA PLACEMENT Left 08/26/2015   Procedure: LEFT RADIOCEPHALIC FISTULA CREATION;  Surgeon: Rosetta Posner, MD;  Location: [place];  Service: Vascular;  Laterality: Left;  . AV FISTULA PLACEMENT Left 11/23/2015   Procedure: ARTERIOVENOUS (AV) FISTULA CREATION;  Surgeon: Rosetta Posner, MD;  Location: [facility];  Service: Vascular;  Laterality: Left;  . CHOLECYSTECTOMY  1994  . CYSTOSCOPY/RETROGRADE/URETEROSCOPY/STONE EXTRACTION WITH BASKET    . ESOPHAGOGASTRODUODENOSCOPY N/A 11/13/2015   Procedure: ESOPHAGOGASTRODUODENOSCOPY (EGD);  Surgeon: Irene Shipper, MD;  [facility] ENDOSCOPY;  Service: Endoscopy;  Laterality: N/A;  . INSERTION OF DIALYSIS CATHETER Left 11/23/2015   Procedure: INSERTION OF DIALYSIS CATHETER;  Surgeon: Rosetta Posner, MD;  Location: [place];  Service: Vascular;  Laterality: Left;  . JOINT REPLACEMENT     Total L-Hip replacement, Right Knee 10/20/09  . LEFT AND RIGHT HEART CATHETERIZATION WITH CORONARY ANGIOGRAM N/A 02/22/2013   Procedure: LEFT AND RIGHT HEART CATHETERIZATION WITH CORONARY ANGIOGRAM;  Surgeon: Jolaine Artist, MD;  Location: [facility] CATH LAB;  Service: Cardiovascular;  Laterality: N/A;  . LITHOTRIPSY  90's  . PERIPHERAL VASCULAR CATHETERIZATION Left 11/19/2015   Procedure: A/V/Fistulagram;  Surgeon: Conrad Bear Lake, MD;  Location: [place] CV LAB;  Service: Cardiovascular;  Laterality: Left;  . SPINE SURGERY     x 2    Prescriptions Prior to Admission  Medication Sig Dispense Refill Last Dose  . acetaminophen (TYLENOL) 500 MG  tablet Take 500 mg by mouth every 6 (six) hours as needed for mild pain.   Past Week at Unknown time  . allopurinol (ZYLOPRIM) 100 MG tablet Take 2 tablets (200 mg total) by mouth daily. (Patient taking differently: Take 100 mg by mouth 2 (two) times daily. ) 30 tablet 0 03/22/2016 at 0700  . B Complex-C-Folic Acid (RENA-VITE RX) 1 MG TABS Take 1 tablet by mouth daily.  6 03/21/2016 at Unknown time  . hydroxychloroquine (PLAQUENIL) 200 MG tablet Take 200 mg by mouth 2 (two) times daily. Reported on 01/13/2016   Past Week at Unknown time  . RENVELA 800 MG tablet Take 1 tablet by mouth 3 (three) times daily with meals.  0 03/21/2016 at Unknown time  . albuterol (PROVENTIL) (2.5 MG/3ML) 0.083% nebulizer solution Take 2.5 mg by nebulization every 4 (four) hours as needed for shortness of breath. Reported on 09/25/2015   Unknown at Unknown time  . fluticasone (FLONASE) 50 MCG/ACT nasal spray Place 2 sprays into both nostrils daily. (Patient taking differently: Place 2 sprays into both nostrils daily as needed for allergies. ) 16 g 6 More than a month at Unknown time  . pantoprazole (PROTONIX) 40 MG tablet Take  1 tablet (40 mg total) by mouth 2 (two) times daily. (Patient taking differently: Take 40 mg by mouth 2 (two) times daily as needed (reflux). ) 60 tablet 0 More than a month at Unknown time  . warfarin (COUMADIN) 7.5 MG tablet TAKE 1 TABLET BY MOUTH DAILY AS DIRECTED BY COUMADIN CLINIC 40 tablet 1 03/17/2016   Allergies  Allergen Reactions  . Ace Inhibitors Other (See Comments)    Worsening renal insufficiency    Social History  Substance Use Topics  . Smoking status: Never Smoker  . Smokeless tobacco: Never Used  . Alcohol use Yes     Comment: occasional    Family History  Problem Relation Age of Onset  . Hypertension Mother   . Arthritis Mother     ?RA  . Hypertension Father      Review of Systems  Musculoskeletal: Positive for joint pain.  All other systems reviewed and are  negative.   Objective:  Physical Exam  Constitutional: He is oriented to person, place, and time. He appears well-developed and well-nourished.  HENT:  Head: Normocephalic and atraumatic.  Eyes: EOM are normal.  Neck: Normal range of motion. Neck supple.  Cardiovascular: Normal rate and regular rhythm.   Respiratory: Effort normal and breath sounds normal.  GI: Soft. Bowel sounds are normal.  Musculoskeletal:       Right hip: He exhibits decreased range of motion, decreased strength, tenderness and bony tenderness.  Neurological: He is alert and oriented to person, place, and time.  Skin: Skin is warm and dry.  Psychiatric: He has a normal mood and affect.    Vital signs in last 24 hours: Temp:  [98.1 F (36.7 C)] 98.1 F (36.7 C) (09/05 0935) Pulse Rate:  [65] 65 (09/05 0935) Resp:  [16] 16 (09/05 0935) BP: (124)/(64) 124/64 (09/05 0935) SpO2:  [100 %] 100 % (09/05 0935) Weight:  [104.8 kg (231 lb)] 104.8 kg (231 lb) (09/05 0935)  Labs:   Estimated body mass index is 27.39 kg/m as calculated from the following:   Height as of this encounter: 6\' 5"  (1.956 m).   Weight as of this encounter: 104.8 kg (231 lb).   Imaging Review Plain radiographs demonstrate severe degenerative joint disease of the right hip(s). The bone quality appears to be good for age and reported activity level.  Assessment/Plan:  End stage arthritis, right hip(s)  The patient history, physical examination, clinical judgement of the provider and imaging studies are consistent with end stage degenerative joint disease of the right hip(s) and total hip arthroplasty is deemed medically necessary. The treatment options including medical management, injection therapy, arthroscopy and arthroplasty were discussed at length. The risks and benefits of total hip arthroplasty were presented and reviewed. The risks due to aseptic loosening, infection, stiffness, dislocation/subluxation,  thromboembolic  complications and other imponderables were discussed.  The patient acknowledged the explanation, agreed to proceed with the plan and consent was signed. Patient is being admitted for inpatient treatment for surgery, pain control, PT, OT, prophylactic antibiotics, VTE prophylaxis, progressive ambulation and ADL's and discharge planning.The patient is planning to be discharged home with home health services vs skilled nursing pending his progress.

## 2016-03-22 NOTE — Anesthesia Postprocedure Evaluation (Signed)
Anesthesia Post Note  Patient: Bruce Cole  Procedure(s) Performed: Procedure(s) (LRB): RIGHT TOTAL HIP ARTHROPLASTY ANTERIOR APPROACH (Right)  Patient location during evaluation: PACU Anesthesia Type: General Level of consciousness: awake and alert Pain management: pain level controlled Vital Signs Assessment: post-procedure vital signs reviewed and stable Respiratory status: spontaneous breathing, nonlabored ventilation, respiratory function stable and patient connected to nasal cannula oxygen Cardiovascular status: blood pressure returned to baseline and stable Postop Assessment: no signs of nausea or vomiting Anesthetic complications: no    Last Vitals:  Vitals:   03/22/16 1550 03/22/16 1605  BP: (!) 109/54 (!) 110/54  Pulse: (!) 57 64  Resp: 15 15  Temp:      Last Pain:  Vitals:   03/22/16 1605  TempSrc:   PainSc: 6                  [name]

## 2016-03-22 NOTE — Addendum Note (Signed)
Addendum  created 03/22/16 1724 by Barrington Ellison, CRNA   Anesthesia Intra Meds edited

## 2016-03-22 NOTE — Anesthesia Procedure Notes (Signed)
Procedure Name: Intubation Date/Time: 03/22/2016 1:11 PM Performed by: Sampson Si E Pre-anesthesia Checklist: Patient identified, Emergency Drugs available, Suction available and Patient being monitored Patient Re-evaluated:Patient Re-evaluated prior to inductionOxygen Delivery Method: Circle System Utilized Preoxygenation: Pre-oxygenation with 100% oxygen Intubation Type: IV induction Ventilation: Mask ventilation without difficulty Laryngoscope Size: Mac and 4 Grade View: Grade II Tube type: Oral Number of attempts: 1 Airway Equipment and Method: Stylet and Oral airway Placement Confirmation: ETT inserted through vocal cords under direct vision,  positive ETCO2 and breath sounds checked- equal and bilateral Secured at: 25 cm Tube secured with: Tape Dental Injury: Teeth and Oropharynx as per pre-operative assessment  Comments: Intubation performed by Marolyn Haller, SRNA

## 2016-03-23 ENCOUNTER — Encounter (HOSPITAL_COMMUNITY): Payer: Self-pay | Admitting: Nephrology

## 2016-03-23 LAB — CBC
HEMATOCRIT: 31.2 % — AB (ref 39.0–52.0)
HEMATOCRIT: 31.5 % — AB (ref 39.0–52.0)
HEMOGLOBIN: 9.9 g/dL — AB (ref 13.0–17.0)
Hemoglobin: 10 g/dL — ABNORMAL LOW (ref 13.0–17.0)
MCH: 30.4 pg (ref 26.0–34.0)
MCH: 29.6 pg (ref 26.0–34.0)
MCHC: 32.1 g/dL (ref 30.0–36.0)
MCHC: 31.4 g/dL (ref 30.0–36.0)
MCV: 94.8 fL (ref 78.0–100.0)
MCV: 94.3 fL (ref 78.0–100.0)
Platelets: 141 10*3/uL — ABNORMAL LOW (ref 150–400)
Platelets: 133 10*3/uL — ABNORMAL LOW (ref 150–400)
RBC: 3.29 MIL/uL — ABNORMAL LOW (ref 4.22–5.81)
RBC: 3.34 MIL/uL — ABNORMAL LOW (ref 4.22–5.81)
RDW: 16.6 % — AB (ref 11.5–15.5)
RDW: 16.4 % — ABNORMAL HIGH (ref 11.5–15.5)
WBC: 10.8 10*3/uL — AB (ref 4.0–10.5)
WBC: 11.1 10*3/uL — AB (ref 4.0–10.5)

## 2016-03-23 LAB — RENAL FUNCTION PANEL
ALBUMIN: 3.6 g/dL (ref 3.5–5.0)
ANION GAP: 21 — AB (ref 5–15)
BUN: 87 mg/dL — ABNORMAL HIGH (ref 6–20)
CO2: 21 mmol/L — ABNORMAL LOW (ref 22–32)
Calcium: 9 mg/dL (ref 8.9–10.3)
Chloride: 87 mmol/L — ABNORMAL LOW (ref 101–111)
Creatinine, Ser: 9.37 mg/dL — ABNORMAL HIGH (ref 0.61–1.24)
GFR calc Af Amer: 6 mL/min — ABNORMAL LOW (ref >60)
GFR, EST NON AFRICAN AMERICAN: 5 mL/min — AB (ref >60)
Glucose, Bld: 117 mg/dL — ABNORMAL HIGH (ref 65–99)
PHOSPHORUS: 8.8 mg/dL — AB (ref 2.5–4.6)
POTASSIUM: 5.1 mmol/L (ref 3.5–5.1)
Sodium: 129 mmol/L — ABNORMAL LOW (ref 135–145)

## 2016-03-23 LAB — BASIC METABOLIC PANEL
ANION GAP: 20 — AB (ref 5–15)
BUN: 86 mg/dL — ABNORMAL HIGH (ref 6–20)
CALCIUM: 9.2 mg/dL (ref 8.9–10.3)
CHLORIDE: 87 mmol/L — AB (ref 101–111)
CO2: 23 mmol/L (ref 22–32)
Creatinine, Ser: 9.56 mg/dL — ABNORMAL HIGH (ref 0.61–1.24)
GFR calc Af Amer: 6 mL/min — ABNORMAL LOW (ref >60)
GFR calc non Af Amer: 5 mL/min — ABNORMAL LOW (ref >60)
GLUCOSE: 119 mg/dL — AB (ref 65–99)
Potassium: 5.4 mmol/L — ABNORMAL HIGH (ref 3.5–5.1)
Sodium: 130 mmol/L — ABNORMAL LOW (ref 135–145)

## 2016-03-23 LAB — PROTIME-INR
INR: 1.32
Prothrombin Time: 16.5 seconds — ABNORMAL HIGH (ref 11.4–15.2)

## 2016-03-23 MED ORDER — LIDOCAINE HCL (PF) 1 % IJ SOLN
INTRAMUSCULAR | Status: AC
  Filled 2016-03-23: qty 30

## 2016-03-23 MED ORDER — LIDOCAINE HCL (PF) 2 % IJ SOLN
0.0000 mL | Freq: Once | INTRAMUSCULAR | Status: DC | PRN
  Filled 2016-03-23: qty 20

## 2016-03-23 MED ORDER — SODIUM CHLORIDE 0.9 % IV SOLN
62.5000 mg | INTRAVENOUS | Status: DC
  Administered 2016-03-23: 62.5 mg via INTRAVENOUS
  Filled 2016-03-23 (×2): qty 5

## 2016-03-23 MED ORDER — SODIUM CHLORIDE 0.9 % IV SOLN: 100.0000 mL | INTRAVENOUS | Status: DC | PRN

## 2016-03-23 MED ORDER — LIDOCAINE-PRILOCAINE 2.5-2.5 % EX CREA: 1.0000 "application " | TOPICAL_CREAM | CUTANEOUS | Status: DC | PRN

## 2016-03-23 MED ORDER — DOXERCALCIFEROL 4 MCG/2ML IV SOLN
1.0000 ug | INTRAVENOUS | Status: DC
  Administered 2016-03-23 – 2016-03-25 (×2): 1 ug via INTRAVENOUS
  Filled 2016-03-23 (×2): qty 2

## 2016-03-23 MED ORDER — HEPARIN SODIUM (PORCINE) 1000 UNIT/ML DIALYSIS: 1000.0000 [IU] | INTRAMUSCULAR | Status: DC | PRN

## 2016-03-23 MED ORDER — ALTEPLASE 2 MG IJ SOLR: 2.0000 mg | Freq: Once | INTRAMUSCULAR | Status: DC | PRN

## 2016-03-23 MED ORDER — LIDOCAINE HCL (PF) 1 % IJ SOLN: 5.0000 mL | INTRAMUSCULAR | Status: DC | PRN

## 2016-03-23 MED ORDER — DOXERCALCIFEROL 4 MCG/2ML IV SOLN
INTRAVENOUS | Status: AC
  Filled 2016-03-23: qty 2

## 2016-03-23 MED ORDER — PENTAFLUOROPROP-TETRAFLUOROETH EX AERO: 1.0000 "application " | INHALATION_SPRAY | CUTANEOUS | Status: DC | PRN

## 2016-03-23 NOTE — Evaluation (Signed)
Physical Therapy Evaluation Patient Details Name: Bruce Cole MRN: [name] DOB: 1941-01-04 Today's Date: 03/23/2016   History of Present Illness  Pt is a 75 y/o male s/p R THA (direct anterior approach). PMH including but not limited to ESRD, CKD stage 3, DM, HTN, A-fib, RA, CHF and hx of L THA in 2011.   Clinical Impression  Pt presented supine in bed with HOB elevated, awake and willing to participate in therapy session. Pt recently returning to his room from a dialysis treatment this AM. Upon achieving sitting EOB with min A, pt reported dizziness which took several minutes to resolve. Pt then performed SPT with mod A to achieve standing and min A to complete the transfer to the recliner. Pt was limited to participation in further therapeutic interventions secondary to pain and fatigue. Based on pt's performance during the evaluation and as he currently lives alone, PT recommending that pt d/c to SNF. Pt would continue to benefit from skilled physical therapy services at this time while admitted and after d/c to address his below listed limitations in order to improve his overall safety and independence with functional mobility.     Follow Up Recommendations SNF    Equipment Recommendations  None recommended by PT;Other (comment) (defer to next venue)    Recommendations for Other Services       Precautions / Restrictions Precautions Precautions: Fall Restrictions Weight Bearing Restrictions: Yes RLE Weight Bearing: Weight bearing as tolerated      Mobility  Bed Mobility Overal bed mobility: Needs Assistance Bed Mobility: Supine to Sit     Supine to sit: Min assist;HOB elevated     General bed mobility comments: pt required increased time, use of bed rails, min A with R LE movement and min A with upper body to achieve sitting EOB  Transfers Overall transfer level: Needs assistance Equipment used: Rolling walker (2 wheeled) Transfers: Sit to/from [employer] Sit to Stand: Mod assist;+2 physical assistance Stand pivot transfers: Min assist;+2 safety/equipment       General transfer comment: Pt required increased time, VC'ing for bilateral hand placement and technique, mod A to power up and achieve full standing position  Ambulation/Gait             General Gait Details: unable to attempt during this session secondary to pain and fatigue  Stairs            Wheelchair Mobility    Modified Rankin (Stroke Patients Only)       Balance Overall balance assessment: Needs assistance Sitting-balance support: Feet supported;Bilateral upper extremity supported Sitting balance-Leahy Scale: Poor     Standing balance support: During functional activity;Bilateral upper extremity supported Standing balance-Leahy Scale: Poor Standing balance comment: pt reliant on bilateral UEs on RW for support                             Pertinent Vitals/Pain Pain Assessment: 0-10 Pain Score: 10-Worst pain ever Pain Location: R hip Pain Descriptors / Indicators: Grimacing;Guarding Pain Intervention(s): Monitored during session;Repositioned;Limited activity within patient's tolerance    Home Living Family/patient expects to be discharged to:: Skilled nursing facility Living Arrangements: Alone                    Prior Function Level of Independence: Independent               Hand Dominance        Extremity/Trunk Assessment  Upper Extremity Assessment: Defer to OT evaluation           Lower Extremity Assessment: RLE deficits/detail RLE Deficits / Details: pt with decreased strength and ROM limitations secondary to post-op.    Cervical / Trunk Assessment: Kyphotic  Communication   Communication: No difficulties  Cognition Arousal/Alertness: Awake/alert Behavior During Therapy: WFL for tasks assessed/performed Overall Cognitive Status: Within Functional Limits for tasks assessed                       General Comments      Exercises Total Joint Exercises Ankle Circles/Pumps: AROM;Both;10 reps;Seated      Assessment/Plan    PT Assessment Patient needs continued PT services  PT Diagnosis Difficulty walking;Acute pain   PT Problem List Decreased strength;Decreased range of motion;Decreased activity tolerance;Decreased balance;Decreased mobility;Decreased coordination;Decreased knowledge of use of DME;Pain  PT Treatment Interventions Stair training;DME instruction;Gait training;Functional mobility training;Therapeutic activities;Therapeutic exercise;Balance training;Neuromuscular re-education;Patient/family education   PT Goals (Current goals can be found in the Care Plan section) Acute Rehab PT Goals Patient Stated Goal: go to rehab prior to returning home PT Goal Formulation: With patient Time For Goal Achievement: 03/30/16 Potential to Achieve Goals: Good    Frequency 7X/week   Barriers to discharge        Co-evaluation               End of Session Equipment Utilized During Treatment: Gait belt Activity Tolerance: Patient limited by fatigue;Patient limited by pain Patient left: in chair;with call bell/phone within reach Nurse Communication: Mobility status         Time: [id number] PT Time Calculation (min) (ACUTE ONLY): 34 min   Charges:   PT Evaluation $PT Eval Moderate Complexity: 1 Procedure PT Treatments $Therapeutic Activity: 8-22 mins   PT G CodesClearnce Sorrel Katara Cole 03/23/2016, 4:10 PM Bruce Cole, [place], DPT [phone]

## 2016-03-23 NOTE — Progress Notes (Signed)
OT Cancellation Note  Patient Details Name: Bruce Cole MRN: [id number] DOB: 07/30/1940   Cancelled Treatment:    Reason Eval/Treat Not Completed: Patient at procedure or test/ unavailable (HD). Will follow.  Malka So 03/23/2016, 8:59 AM  [phone]

## 2016-03-23 NOTE — Progress Notes (Signed)
  Catheter Removal Procedure Note    Diagnosis: ESRD  Plan:  Remove left diatek catheter  Consent signed:  Yes.   Time out completed:  Yes.   Coumadin:  Yes.   PT/INR (if applicable):  1.3 Other labs:  Procedure: 1.  Sterile prepping and draping over catheter area 2. 8 ml 2% lidocaine plain instilled at removal site. 3.  left catheter removed in its entirety with cuff in tact. 4.  Complications:  none 5. Tip of catheter sent for culture:  No.   Patient tolerated procedure well:  Yes.   Pressure held, no bleeding noted, dressing applied Instructions given to the pt regarding wound care and bleeding.  OtherLeontine Locket, PA-C 03/23/2016 12:52 PM [phone]

## 2016-03-23 NOTE — Progress Notes (Signed)
PT Cancellation Note  Patient Details Name: Bruce Cole MRN: [name] DOB: 04/25/1941   Cancelled Treatment:    Reason Eval/Treat Not Completed: Patient at procedure or test/unavailable. Pt off of unit for dialysis. PT will continue to f/u with pt as appropriate.   Clearnce Sorrel Larissa Pegg 03/23/2016, 10:30 AM Sherie Don, PT, DPT [phone]

## 2016-03-23 NOTE — Care Management Note (Addendum)
Case Management Note  Patient Details  Name: ZI WATERMAN MRN: [id number] Date of Birth: 08-05-1940  Subjective/Objective:     75 yr old gentleman s/p right total hip arthroplasty.               Action/Plan: Patient will need shortterm rehab at [facility]. Social worker is aware.  Patient was preoperatively setup with [facility] for therapy.   Expected Discharge Date:   03/24/16               Expected Discharge Plan: [place] In-House Referral:     Discharge planning Services  CM Consult  Post Acute Care Choice:  Home Health Choice offered to:  Patient  DME Arranged:  N/A DME Agency:     HH Arranged:  PT [place]:  [facility] (now [facility])  Status of Service:  Completed, signed off  If discussed at [place] of Stay Meetings, dates discussed:    Additional Comments:  Ninfa Meeker, RN 03/23/2016, 2:19 PM

## 2016-03-23 NOTE — Consult Note (Signed)
Renal Service Consult Note [place] 03/23/2016 [place] Requesting Physician:  Dr. Ninfa Linden  Reason for Consult:  ESRD pt with R THA HPI: The patient is a 75 y.o. year-old with hx of combined CHF, cardiorenal and ESRD on HD started this April.  Admitted for R hip THA for severe DJD.  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  No c/o, post op had surg yest.  Hb 9.9 this am, 11-12 baseline.    Denies any cough, CP, SOB, fevers, chills , sweats.  Has IJ cath , but using fistula at HD for last 3 wks acc to patient.     ROS  denies CP  no joint pain   no HA  no blurry vision  no rash  no diarrhea  no nausea/ vomiting    Past Medical History  Past Medical History:  Diagnosis Date  . Acute blood loss anemia   . Acute renal failure superimposed on stage 3 chronic kidney disease ([place])   . AKI (acute kidney injury) ([place])   . Atrial fibrillation (HCC)    Chronic  . Cardiorenal disease   . CHF (congestive heart failure) ([place])   . Chronic combined systolic and diastolic heart failure ([place])   . CKD (chronic kidney disease), stage IV ([place])    hd tues/thurs/sat-[facility]  . Coagulopathy ([place])   . Colon polyp 2000  . Diabetes mellitus type 2, controlled, with complications ([place])    denies  . DM (diabetes mellitus), type 2 with renal complications ([place]) [id number]   Qualifier: Diagnosis of  By: Inda Castle FNP, [place]   . Dysrhythmia    hx  . ESRD on dialysis ([place]) 11/27/2015  . Gastritis and gastroduodenitis   . GI bleed   . Gout   . Heart murmur   . Heme positive stool   . Nephrolithiasis   . OSA (obstructive sleep apnea) 09/02/2013    IMPRESSION :  1. Mild obstructive sleep apnea with hypopneas causing sleep fragmentation and moderate oxygen desaturation.  2. Short runs of nonsustained VT were noted. His beta blocker may need to be titrated 3. Significant PLM's were noted, the PLM arousal index was low. Please  correlate with clinical history of restless leg syndrome.  4. Sleep efficiency was poor.  RECOMMENDATION:  1. Treatment options for this degree of sleep disordered breathing include weight loss and positional therapy to avoid supine sleep 2. Consider titrating beta blocker further, defer to cardiologist 3. Patient should be cautioned against driving when sleepy.They should be asked to avoid medications with sedative side effects     . Pleural effusion   . Pleural effusion on right   . Pneumonia    hx 30 yrs ago  . Primary osteoarthritis of right hip   . Rheumatoid arthritis ([place])   . S/P dialysis catheter insertion ([place])   . Supratherapeutic INR   . Symptomatic anemia   . Tachypnea   . Thrombocytopenia ([place])   . Unspecified essential hypertension    Past Surgical History  Past Surgical History:  Procedure Laterality Date  . AV FISTULA PLACEMENT Left 08/26/2015   Procedure: LEFT RADIOCEPHALIC FISTULA CREATION;  Surgeon: Rosetta Posner, MD;  Location: [facility];  Service: Vascular;  Laterality: Left;  . AV FISTULA PLACEMENT Left 11/23/2015   Procedure: ARTERIOVENOUS (AV) FISTULA CREATION;  Surgeon: Rosetta Posner, MD;  Location: [facility];  Service: Vascular;  Laterality: Left;  . CHOLECYSTECTOMY  1994  .  CYSTOSCOPY/RETROGRADE/URETEROSCOPY/STONE EXTRACTION WITH BASKET    . ESOPHAGOGASTRODUODENOSCOPY N/A 11/13/2015   Procedure: ESOPHAGOGASTRODUODENOSCOPY (EGD);  Surgeon: Irene Shipper, MD;  Location[facility] ENDOSCOPY;  Service: Endoscopy;  Laterality: N/A;  . INSERTION OF DIALYSIS CATHETER Left 11/23/2015   Procedure: INSERTION OF DIALYSIS CATHETER;  Surgeon: Rosetta Posner, MD;  Location: [place] [facility];  Service: Vascular;  Laterality: Left;  . JOINT REPLACEMENT     Total L-Hip replacement, Right Knee 10/20/09  . LEFT AND RIGHT HEART CATHETERIZATION WITH CORONARY ANGIOGRAM N/A 02/22/2013   Procedure: LEFT AND RIGHT HEART CATHETERIZATION WITH CORONARY ANGIOGRAM;  Surgeon: Jolaine Artist, MD;  Location: [facility] CATH LAB;   Service: Cardiovascular;  Laterality: N/A;  . LITHOTRIPSY  90's  . PERIPHERAL VASCULAR CATHETERIZATION Left 11/19/2015   Procedure: A/V/Fistulagram;  Surgeon: Conrad Stratford, MD;  Location: [facility] CV LAB;  Service: Cardiovascular;  Laterality: Left;  . SPINE SURGERY     x 2   Family History  Family History  Problem Relation Age of Onset  . Hypertension Mother   . Arthritis Mother     ?RA  . Hypertension Father    Social History  reports that he has never smoked. He has never used smokeless tobacco. He reports that he drinks alcohol. He reports that he does not use drugs. Allergies  Allergies  Allergen Reactions  . Ace Inhibitors Other (See Comments)    Worsening renal insufficiency   Home medications Prior to Admission medications   Medication Sig Start Date End Date Taking? Authorizing Provider  acetaminophen (TYLENOL) 500 MG tablet Take 500 mg by mouth every 6 (six) hours as needed for mild pain.   Yes Historical Provider, MD  allopurinol (ZYLOPRIM) 100 MG tablet Take 2 tablets (200 mg total) by mouth daily. Patient taking differently: Take 100 mg by mouth 2 (two) times daily.  11/25/15  Yes Robbie Lis, MD  B Complex-C-Folic Acid (RENA-VITE RX) 1 MG TABS Take 1 tablet by mouth daily. 02/18/16  Yes Historical Provider, MD  hydroxychloroquine (PLAQUENIL) 200 MG tablet Take 200 mg by mouth 2 (two) times daily. Reported on 01/13/2016 09/11/14  Yes Bo Merino, MD  RENVELA 800 MG tablet Take 1 tablet by mouth 3 (three) times daily with meals. 01/23/16  Yes Historical Provider, MD  albuterol (PROVENTIL) (2.5 MG/3ML) 0.083% nebulizer solution Take 2.5 mg by nebulization every 4 (four) hours as needed for shortness of breath. Reported on 09/25/2015    Historical Provider, MD  fluticasone (FLONASE) 50 MCG/ACT nasal spray Place 2 sprays into both nostrils daily. Patient taking differently: Place 2 sprays into both nostrils daily as needed for allergies.  03/18/15   Debbrah Alar, NP   pantoprazole (PROTONIX) 40 MG tablet Take 1 tablet (40 mg total) by mouth 2 (two) times daily. Patient taking differently: Take 40 mg by mouth 2 (two) times daily as needed (reflux).  11/25/15   Robbie Lis, MD  warfarin (COUMADIN) 7.5 MG tablet TAKE 1 TABLET BY MOUTH DAILY AS DIRECTED BY COUMADIN CLINIC 12/16/15   Evans Lance, MD   Liver Function Tests  Recent Labs Lab 03/23/16 0730  ALBUMIN 3.6   No results for input(s): LIPASE, AMYLASE in the last 168 hours. CBC  Recent Labs Lab 03/22/16 1008 03/23/16 0601 03/23/16 0730  WBC  --  10.8* 11.1*  HGB 12.2* 10.0* 9.9*  HCT 36.0* 31.2* 31.5*  MCV  --  94.8 94.3  PLT  --  141* Q000111Q*   Basic Metabolic Panel  Recent  Labs Lab 03/22/16 1008 03/23/16 0601 03/23/16 0730  NA 132* 130* 129*  K 5.0 5.4* 5.1  CL  --  87* 87*  CO2  --  23 21*  GLUCOSE 103* 119* 117*  BUN  --  86* 87*  CREATININE  --  9.56* 9.37*  CALCIUM  --  9.2 9.0  PHOS  --   --  8.8*   Iron/TIBC/Ferritin/ %Sat    Component Value Date/Time   IRON 14 (L) 11/12/2015 0507   TIBC 392 11/12/2015 0507   FERRITIN 18 (L) 11/12/2015 0507   IRONPCTSAT 4 (L) 11/12/2015 0507    Vitals:   03/23/16 0830 03/23/16 0845 03/23/16 0915 03/23/16 0945  BP: (!) 105/56 114/64 109/72 (!) 100/56  Pulse: 65 67 71 75  Resp:      Temp:      TempSrc:      SpO2:      Weight:      Height:       Exam Gen alert, calm No rash, cyanosis or gangrene Sclera anicteric, throat clear  No jvd or bruits Chest clear bilat RRR no MRG Abd soft ntnd no mass or ascites +bs GU normal male MS no joint effusions or deformity Ext no LE or UE edema / no wounds or ulcers Neuro is alert, Ox 3 , nf LUA AVF +bruit L IJ cath no drainage / erythema/ fluctuance  Dialysis:  4h  250/800   102.5kg   2/2.25 bath  Maturing AVF/ IJ cath (AVF stuck once last HD) Hep 3000 Hect 1 Fe 50 mg/ wk Mircera 100 q 1, last 9/4  Assessment: 1.  S/P right hip THA 2.  ESRD started HD in April, they are  using AVF w 2 needles for 3-4 wks, d/w AF head nurse, ok to remove catheter while here 3.  Vol - is up 1-2kg, no gross excess on exam 4.  Hx combined CHF - compensated 5.  HTN no longer on medication 6.  Afib on coumadin 7.  Gout 8.  RA on plaquenil 9.  Anemia of CKD - on ESA, dose not due for 12 days   Plan - HD today, UF to dry wt.  Remove HD cath, have called [facility].    Kelly Splinter MD [place] pager [phone]    cell [phone] 03/23/2016, 9:57 AM

## 2016-03-23 NOTE — Progress Notes (Signed)
H&P    CC:  Catheter removal   HPI:  This is a 75 y.o. male who was admitted yesterday and underwent Right total hip arthroplasty through direct anterior approach.  He is ESRD and has a functioning left basilic vein transposition.  He has a tunneled dialysis catheter that was placed at the same time as his BVT on 11/23/15.  He is on coumadin for Afib.  Given that he is in the hospital and off his coumadin, we are asked to remove his catheter.  His INR today is 1.3.    Past Medical History:  Diagnosis Date  . Acute blood loss anemia   . Atrial fibrillation (HCC)    Chronic  . Chronic combined systolic and diastolic heart failure ([place])   . Colon polyp 2000  . Dysrhythmia    hx  . ESRD on hemodialysis [facility])    Started HD April 2017, ESRD d/t cardiorenal syndrome  . Essential hypertension   . Gastritis and gastroduodenitis   . GI bleed   . Gout   . Heart murmur   . Nephrolithiasis   . OSA (obstructive sleep apnea) 09/02/2013    IMPRESSION :  1. Mild obstructive sleep apnea with hypopneas causing sleep fragmentation and moderate oxygen desaturation.  2. Short runs of nonsustained VT were noted. His beta blocker may need to be titrated 3. Significant PLM's were noted, the PLM arousal index was low. Please correlate with clinical history of restless leg syndrome.  4. Sleep efficiency was poor.  RECOMMENDATION:  1. Treatment options for this degree of sleep disordered breathing include weight loss and positional therapy to avoid supine sleep 2. Consider titrating beta blocker further, defer to cardiologist 3. Patient should be cautioned against driving when sleepy.They should be asked to avoid medications with sedative side effects     . Pneumonia    hx 30 yrs ago  . Primary osteoarthritis of right hip   . Rheumatoid arthritis ([place])   . Thrombocytopenia (HCC)     FH:  Non-Contributory  Social History   Social History  . Marital status: Single    Spouse name: N/A  . Number of  children: 3  . Years of education: N/A   Occupational History  . retired Programmer, [employer]  Retired   Social History Main Topics  . Smoking status: Never Smoker  . Smokeless tobacco: Never Used  . Alcohol use Yes     Comment: occasional  . Drug use: No  . Sexual activity: Not on file   Other Topics Concern  . Not on file   Social History Narrative   Former [employer]   Admitted to [facility] 11/25/15   Widowed   Never smoked   FULL CODE    Allergies  Allergen Reactions  . Ace Inhibitors Other (See Comments)    Worsening renal insufficiency    Current Facility-Administered Medications  Medication Dose Route Frequency Provider Last Rate Last Dose  . 0.9 %  sodium chloride infusion   Intravenous Continuous Mcarthur Rossetti, MD 75 mL/hr at 03/22/16 1821    . acetaminophen (TYLENOL) tablet 650 mg  650 mg Oral Q6H PRN Mcarthur Rossetti, MD       Or  . acetaminophen (TYLENOL) suppository 650 mg  650 mg Rectal Q6H PRN Mcarthur Rossetti, MD      . albuterol (PROVENTIL) (2.5 MG/3ML) 0.083% nebulizer solution 2.5 mg  2.5 mg Nebulization Q4H PRN Mcarthur Rossetti, MD      .  allopurinol (ZYLOPRIM) tablet 100 mg  100 mg Oral BID Mcarthur Rossetti, MD   100 mg at 03/23/16 1149  . diphenhydrAMINE (BENADRYL) 12.5 MG/5ML elixir 12.5-25 mg  12.5-25 mg Oral Q4H PRN Mcarthur Rossetti, MD      . docusate sodium (COLACE) capsule 100 mg  100 mg Oral BID Mcarthur Rossetti, MD   100 mg at 03/23/16 1150  . doxercalciferol (HECTOROL) injection 1 mcg  1 mcg Intravenous Q M,W,F-HD Roney Jaffe, MD   1 mcg at 03/23/16 1031  . ferric gluconate (NULECIT) 62.5 mg in sodium chloride 0.9 % 100 mL IVPB  62.5 mg Intravenous Q Wed-HD Roney Jaffe, MD   62.5 mg at 03/23/16 1029  . HYDROmorphone (DILAUDID) injection 1 mg  1 mg Intravenous Q2H PRN Mcarthur Rossetti, MD   1 mg at 03/23/16 0040  . hydroxychloroquine (PLAQUENIL) tablet 200 mg  200  mg Oral BID Mcarthur Rossetti, MD   200 mg at 03/23/16 1149  . lidocaine (XYLOCAINE) 2 % injection 0-20 mL  0-20 mL Intradermal Once PRN Alyxander Kollmann J Ersie Savino, PA-C      . menthol-cetylpyridinium (CEPACOL) lozenge 3 mg  1 lozenge Oral PRN Mcarthur Rossetti, MD       Or  . phenol (CHLORASEPTIC) mouth spray 1 spray  1 spray Mouth/Throat PRN Mcarthur Rossetti, MD      . methocarbamol (ROBAXIN) tablet 500 mg  500 mg Oral Q6H PRN Mcarthur Rossetti, MD   500 mg at 03/23/16 1149   Or  . methocarbamol (ROBAXIN) 500 mg in dextrose 5 % 50 mL IVPB  500 mg Intravenous Q6H PRN Mcarthur Rossetti, MD      . metoCLOPramide (REGLAN) tablet 5-10 mg  5-10 mg Oral Q8H PRN Mcarthur Rossetti, MD       Or  . metoCLOPramide (REGLAN) injection 5-10 mg  5-10 mg Intravenous Q8H PRN Mcarthur Rossetti, MD      . multivitamin (RENA-VIT) tablet 1 tablet  1 tablet Oral QHS Mcarthur Rossetti, MD   1 tablet at 03/22/16 2154  . ondansetron (ZOFRAN) tablet 4 mg  4 mg Oral Q6H PRN Mcarthur Rossetti, MD       Or  . ondansetron [facility]) injection 4 mg  4 mg Intravenous Q6H PRN Mcarthur Rossetti, MD      . oxyCODONE (Oxy IR/ROXICODONE) immediate release tablet 5-10 mg  5-10 mg Oral Q3H PRN Mcarthur Rossetti, MD   10 mg at 03/23/16 0550  . sevelamer carbonate (RENVELA) tablet 800 mg  800 mg Oral TID WC Mcarthur Rossetti, MD   800 mg at 03/23/16 1150  . warfarin (COUMADIN) tablet 7.5 mg  7.5 mg Oral q1800 Mcarthur Rossetti, MD   7.5 mg at 03/22/16 1821  . Warfarin - Physician Dosing Inpatient   Does not apply q1800 Mcarthur Rossetti, MD      . zolpidem [facility]) tablet 5 mg  5 mg Oral QHS PRN Mcarthur Rossetti, MD        ROS:  See HPI  PHYSICAL EXAM  Vitals:   03/23/16 1045 03/23/16 1100  BP: (!) 105/55 (!) 107/57  Pulse: 65 68  Resp:  14  Temp:  98.5 F (36.9 C)    Gen:  Well developed well nourished HEENT:  normocephalic Neck:  Left tunneled dialysis  catheter Heart:  regular Lungs:  Non-labored Abdomen:  Soft, NT/ND Extremities:  +thrill left upper arm fistula; 3+ left radial pulse  Skin:  No obvious rashes Neuro:  In tact  Lab:  INR 1.3   Impression: This is a 75 y.o. male  for diatek catheter removal  Plan:  Removal of left diatek catheter -consent signed and placed on chart -instructions have been discussed with the pt    Leontine Locket, PA-C [facility] [phone] 03/23/2016 12:18 PM

## 2016-03-23 NOTE — Procedures (Signed)
  I was present at this dialysis session, have reviewed the session itself and made  appropriate changes Kelly Splinter MD [place] pager [phone]    cell [phone] 03/23/2016, 10:11 AM

## 2016-03-23 NOTE — Op Note (Signed)
NAMEABDI, ROLLE              ACCOUNT NO.:  [id number]  MEDICAL RECORD NO.:  [id number]  LOCATION:  5N12C                        FACILITY:  [facility]  PHYSICIAN:  [place] Guest. Ninfa Linden, M.D.DATE OF BIRTH:  08-25-1940  DATE OF PROCEDURE:  03/22/2016 DATE OF DISCHARGE:                              OPERATIVE REPORT   PREOPERATIVE DIAGNOSIS:  Primary osteoarthritis and degenerative joint disease, right hip.  POSTOPERATIVE DIAGNOSIS:  Primary osteoarthritis and degenerative joint disease, right hip.  PROCEDURE:  Right total hip arthroplasty through direct anterior approach.  IMPLANTS:  DePuy Sector Gription acetabular component size 60 with three screws, size 36+ 0 polyethylene liner, size 14 Corail femoral component with standard offset, size 36+ 5 ceramic hip ball.  SURGEON:  [place] Guest. Ninfa Linden, M.D.  ASSISTANT:  Erskine Emery, PA-C.  ANESTHESIA:  General.  ANTIBIOTICS:  2 g of IV Ancef.  BLOOD LOSS:  700-800 mL.  COMPLICATIONS:  None.  INDICATIONS:  Mr. Pufahl is a 75 year old gentleman with severe debilitating arthritis involving his right hip.  He has significant heart disease and renal disease.  He had to obtain cardiac clearance from his cardiologist, Dr. Haroldine Laws.  He still had moderate-to-high risk for the surgery, but his pain is daily, it is detrimentally affected his activities of daily living, his quality of life, his mobility to the point that he does wish to proceed with a total hip arthroplasty.  He understands the risk of acute blood loss anemia, nerve and vessel injury, fracture, infection, dislocation, DVT, as well as the risks to his heart and lungs.  He does wish to proceed given these risks, he understands our goals are decreased pain, improved mobility and overall improved quality of life.  PROCEDURE DESCRIPTION:  After informed consent was obtained, appropriate right hip was marked.  He was brought to the operating room and placed supine  on the operating table.  General anesthesia was then obtained. Traction boots were placed on both of his feet.  He was placed supine on the Hana fracture table with the perineal post in place and both legs in inline skeletal traction devices, but no traction applied.  His right operative hip was prepped and draped with DuraPrep and sterile drapes. A time-out was called, he was identified as correct patient and correct right hip.  I then made an incision inferior and posterior to the anterior superior iliac spine and carried this obliquely down the leg. We dissected down the tensor fascia lata muscle.  The tensor fascia was then divided longitudinally, so we could proceed with a direct anterior approach to the hip.  We identified and cauterized the circumflex vessels and identified the hip capsule, opening up the hip capsule in L- type format, finding significant periarticular osteophytes and significant joint effusion.  We irrigated this out and then made our femoral neck cut proximal to the lesser trochanter using the oscillating saw and completed this with an osteotome.  We had a difficult time removing the femoral head and we were able to remove the femoral head and found it to be completely devoid of cartilage.  There were significant sclerotic changes in the acetabulum as well.  We placed a bent Hohmann over the medial  acetabular rim and removed remnants of the acetabular labrum as much periarticular osteophytes as we could.  We then began reaming under direct visualization and direct fluoroscopy from a size 42 reamer up to a size 60.  The last two reamers were also placed under direct fluoroscopy.  We then placed a real DePuy Sector Gription acetabular component size 60 three screws and a neutral polyethylene liner.  Attention was then turned to the femur.  With the leg externally rotated to 120-130 degrees, we brought the leg down and under, and used a Mueller retractor medially and  a Hohmann retractor behind the greater trochanter.  We released the lateral joint capsule, used a box cutting osteotome to enter the femoral canal and a rongeur to lateralize.  We began broaching using the Corail broaching system from a size 8 up to the size 14.  With the 14 in place, we trialed a standard offset in the femoral neck and 36+ 1.5 hip ball.  We brought the leg back over and up with traction and internal rotation reducing the pelvis.  We were pleased with range of motion and stability, but we felt like he needed a little bit more leg length and offset.  We dislocated the hip and removed the trial components.  We then able to place the real Corail femoral component size 14 and a 36+ 1.5 ceramic hip ball, we reduced this in acetabulum and he was stable.  We then irrigated the soft tissue with normal saline solution using the pulsatile lavage.  We were not able to close the joint capsule.  We closed the iliotibial band with interrupted #1 Vicryl suture followed by 0 Vicryl in the deep tissue, 2-0 Vicryl in the subcutaneous tissue, interrupted staples on the skin.  Xeroform and Aquacel dressing were applied.  He was taken off the Hana table, awakened, extubated and taken to the recovery room in stable condition.  All final counts were correct.  There were no complications noted.  Of note, Erskine Emery, PA-C assisted in the entire case.  His assistance was crucial for facilitating all aspects of this case.     Lind Guest. Ninfa Linden, M.D.     CYB/MEDQ  D:  03/22/2016  T:  03/23/2016  Job:  [id number]

## 2016-03-23 NOTE — Progress Notes (Signed)
Subjective: 1 Day Post-Op Procedure(s) (LRB): RIGHT TOTAL HIP ARTHROPLASTY ANTERIOR APPROACH (Right) Patient reports pain as moderate.  Acute blood loss anemia from surgery.  Objective: Vital signs in last 24 hours: Temp:  [98.1 F (36.7 C)-98.9 F (37.2 C)] 98.8 F (37.1 C) (09/06 1423) Pulse Rate:  [57-75] 66 (09/06 1423) Resp:  [14-17] 16 (09/06 1423) BP: (98-119)/(54-72) 106/58 (09/06 1423) SpO2:  [97 %-100 %] 99 % (09/06 1423) Weight:  [101.3 kg (223 lb 5.2 oz)-103.5 kg (228 lb 2.8 oz)] 101.3 kg (223 lb 5.2 oz) (09/06 1100)  Intake/Output from previous day: 09/05 0701 - 09/06 0700 In: 990 [P.O.:240; I.V.:500; IV Piggyback:250] Out: 825 [Urine:125; Blood:700] Intake/Output this shift: Total I/O In: 450 [P.O.:450] Out: 1977 [Other:1977]   Recent Labs  03/22/16 1008 03/23/16 0601 03/23/16 0730  HGB 12.2* 10.0* 9.9*    Recent Labs  03/23/16 0601 03/23/16 0730  WBC 10.8* 11.1*  RBC 3.29* 3.34*  HCT 31.2* 31.5*  PLT 141* 133*    Recent Labs  03/23/16 0601 03/23/16 0730  NA 130* 129*  K 5.4* 5.1  CL 87* 87*  CO2 23 21*  BUN 86* 87*  CREATININE 9.56* 9.37*  GLUCOSE 119* 117*  CALCIUM 9.2 9.0    Recent Labs  03/22/16 0956 03/23/16 0601  INR 1.24 1.32    Sensation intact distally Intact pulses distally Dorsiflexion/Plantar flexion intact Incision: dressing C/D/I  Assessment/Plan: 1 Day Post-Op Procedure(s) (LRB): RIGHT TOTAL HIP ARTHROPLASTY ANTERIOR APPROACH (Right) Up with therapy Discharge to SNF Friday  Bruce Cole 03/23/2016, 5:50 PM

## 2016-03-24 LAB — PROTIME-INR
INR: 1.61
PROTHROMBIN TIME: 19.4 s — AB (ref 11.4–15.2)

## 2016-03-24 LAB — CBC
HCT: 31.4 % — ABNORMAL LOW (ref 39.0–52.0)
HEMOGLOBIN: 9.8 g/dL — AB (ref 13.0–17.0)
MCH: 29.8 pg (ref 26.0–34.0)
MCHC: 31.2 g/dL (ref 30.0–36.0)
MCV: 95.4 fL (ref 78.0–100.0)
Platelets: 147 10*3/uL — ABNORMAL LOW (ref 150–400)
RBC: 3.29 MIL/uL — AB (ref 4.22–5.81)
RDW: 16.7 % — ABNORMAL HIGH (ref 11.5–15.5)
WBC: 13.5 10*3/uL — ABNORMAL HIGH (ref 4.0–10.5)

## 2016-03-24 LAB — HEPATITIS B SURFACE ANTIGEN: HEP B S AG: NEGATIVE

## 2016-03-24 MED ORDER — OXYCODONE-ACETAMINOPHEN 5-325 MG PO TABS: 1.0000 | ORAL_TABLET | ORAL | 0 refills | Status: DC | PRN

## 2016-03-24 MED ORDER — METHOCARBAMOL 500 MG PO TABS: 500.0000 mg | ORAL_TABLET | Freq: Four times a day (QID) | ORAL | 0 refills | Status: DC | PRN

## 2016-03-24 NOTE — Plan of Care (Signed)
Problem: Safety: Goal: Ability to remain free from injury will improve Outcome: Progressing No fall or injury this shift  Problem: Pain Managment: Goal: General experience of comfort will improve Outcome: Progressing Medicated twice this shift for pain  Problem: Activity: Goal: Risk for activity intolerance will decrease Outcome: Progressing Resting in the ebd this shift  Problem: Bowel/Gastric: Goal: Will not experience complications related to bowel motility Outcome: Progressing No bowel issues noted

## 2016-03-24 NOTE — Progress Notes (Signed)
Physical Therapy Treatment Patient Details Name: Bruce Cole MRN: [id number] DOB: 10-Nov-1940 Today's Date: 03/24/2016    History of Present Illness Pt is a 74 y/o male s/p R THA (direct anterior approach). PMH including but not limited to ESRD, CKD stage 3, DM, HTN, A-fib, RA, CHF and hx of L THA in 2011.     PT Comments    Pt presented supine in bed with HOB elevated, awake and willing to participate in therapy session. Pt reported increased pain, nausea and fatigue throughout session which limited his participation. Pt making progress with ambulation and functional mobility during this session. Pt would continue to benefit from skilled physical therapy services at this time while admitted and after d/c to address his limitations in order to improve his overall safety and independence with functional mobility.   Follow Up Recommendations  SNF     Equipment Recommendations  None recommended by PT    Recommendations for Other Services       Precautions / Restrictions Precautions Precautions: Fall Restrictions Weight Bearing Restrictions: Yes RLE Weight Bearing: Weight bearing as tolerated    Mobility  Bed Mobility Overal bed mobility: Needs Assistance Bed Mobility: Supine to Sit;Sit to Supine     Supine to sit: Min guard;HOB elevated Sit to supine: Min guard   General bed mobility comments: pt required increased time and use of bed rails  Transfers Overall transfer level: Needs assistance Equipment used: Rolling walker (2 wheeled) Transfers: Sit to/from Stand Sit to Stand: Min assist;From elevated surface         General transfer comment: Pt required increased time, VC'ing for bilateral hand placement and technique, min A to power up and achieve full standing position  Ambulation/Gait Ambulation/Gait assistance: Min guard Ambulation Distance (Feet): 30 Feet Assistive device: Rolling walker (2 wheeled) Gait Pattern/deviations: Step-to pattern;Decreased step  length - left;Decreased stance time - right;Decreased weight shift to right;Trunk flexed Gait velocity: decreased Gait velocity interpretation: Below normal speed for age/gender General Gait Details: pt relying heavily on bilateral UEs to weight bear through [employer] Mobility    Modified Rankin (Stroke Patients Only)       Balance Overall balance assessment: Needs assistance Sitting-balance support: Feet supported;No upper extremity supported Sitting balance-Leahy Scale: Good     Standing balance support: During functional activity;Bilateral upper extremity supported Standing balance-Leahy Scale: Poor                      Cognition Arousal/Alertness: Awake/alert Behavior During Therapy: WFL for tasks assessed/performed Overall Cognitive Status: Within Functional Limits for tasks assessed                      Exercises      General Comments        Pertinent Vitals/Pain Pain Assessment: Faces Faces Pain Scale: Hurts little more Pain Location: R hip and R thigh Pain Descriptors / Indicators: Grimacing;Guarding;Sore Pain Intervention(s): Monitored during session;Repositioned;Ice applied    Home Living Family/patient expects to be discharged to:: Skilled nursing facility Living Arrangements: Alone                  Prior Function Level of Independence: Independent          PT Goals (current goals can now be found in the care plan section) Acute Rehab PT Goals Patient Stated Goal: go to rehab prior to returning home PT  Goal Formulation: With patient Time For Goal Achievement: 03/30/16 Potential to Achieve Goals: Good Progress towards PT goals: Progressing toward goals    Frequency  7X/week    PT Plan Current plan remains appropriate    Co-evaluation             End of Session Equipment Utilized During Treatment: Gait belt Activity Tolerance: Patient limited by fatigue;Patient limited by  pain Patient left: in bed;with call bell/phone within reach     Time: 1530-1555 PT Time Calculation (min) (ACUTE ONLY): 25 min  Charges:  $Gait Training: 8-22 mins $Therapeutic Activity: 8-22 mins                    G CodesClearnce Sorrel Emanuel Dowson 2016-04-06, 5:51 PM Sherie Don, [place], DPT [phone]

## 2016-03-24 NOTE — Progress Notes (Signed)
Subjective: 2 Days Post-Op Procedure(s) (LRB): RIGHT TOTAL HIP ARTHROPLASTY ANTERIOR APPROACH (Right) Patient reports pain as moderate.    Objective: Vital signs in last 24 hours: Temp:  [98.4 F (36.9 C)-98.8 F (37.1 C)] 98.4 F (36.9 C) (09/07 1441) Pulse Rate:  [66-71] 68 (09/07 1441) Resp:  [16-18] 18 (09/07 1441) BP: (99-106)/(51-65) 99/51 (09/07 1441) SpO2:  [98 %-99 %] 98 % (09/07 1441)  Intake/Output from previous day: 09/06 0701 - 09/07 0700 In: 450 [P.O.:450] Out: 1977  Intake/Output this shift: Total I/O In: 560 [P.O.:560] Out: 750 [Urine:750]   Recent Labs  03/22/16 1008 03/23/16 0601 03/23/16 0730 03/24/16 0339  HGB 12.2* 10.0* 9.9* 9.8*    Recent Labs  03/23/16 0730 03/24/16 0339  WBC 11.1* 13.5*  RBC 3.34* 3.29*  HCT 31.5* 31.4*  PLT 133* 147*    Recent Labs  03/23/16 0601 03/23/16 0730  NA 130* 129*  K 5.4* 5.1  CL 87* 87*  CO2 23 21*  BUN 86* 87*  CREATININE 9.56* 9.37*  GLUCOSE 119* 117*  CALCIUM 9.2 9.0    Recent Labs  03/23/16 0601 03/24/16 0339  INR 1.32 1.61    Sensation intact distally Intact pulses distally Dorsiflexion/Plantar flexion intact Incision: scant drainage  Assessment/Plan: 2 Days Post-Op Procedure(s) (LRB): RIGHT TOTAL HIP ARTHROPLASTY ANTERIOR APPROACH (Right) Up with therapy Plan for discharge tomorrow Discharge to SNF  Mcarthur Rossetti 03/24/2016, 5:48 PM

## 2016-03-24 NOTE — Discharge Instructions (Addendum)
Information on my medicine - Coumadin   (Warfarin)  This medication education was reviewed with me or my healthcare representative as part of my discharge preparation.   Why was Coumadin prescribed for you? Coumadin was prescribed for you because you have a blood clot or a medical condition that can cause an increased risk of forming blood clots. Blood clots can cause serious health problems by blocking the flow of blood to the heart, lung, or brain. Coumadin can prevent harmful blood clots from forming. As a reminder your indication for Coumadin is:   Stroke Prevention Because Of Atrial Fibrillation  What test will check on my response to Coumadin? While on Coumadin (warfarin) you will need to have an INR test regularly to ensure that your dose is keeping you in the desired range. The INR (international normalized ratio) number is calculated from the result of the laboratory test called prothrombin time (PT).  If an INR APPOINTMENT HAS NOT ALREADY BEEN MADE FOR YOU please schedule an appointment to have this lab work done by your health care provider within 7 days. Your INR goal is usually a number between:  2 to 3 or your provider may give you a more narrow range like 2-2.5.  Ask your health care provider during an office visit what your goal INR is.  What  do you need to  know  About  COUMADIN? Take Coumadin (warfarin) exactly as prescribed by your healthcare provider about the same time each day.  DO NOT stop taking without talking to the doctor who prescribed the medication.  Stopping without other blood clot prevention medication to take the place of Coumadin may increase your risk of developing a new clot or stroke.  Get refills before you run out.  What do you do if you miss a dose? If you miss a dose, take it as soon as you remember on the same day then continue your regularly scheduled regimen the next day.  Do not take two doses of Coumadin at the same time.  Important Safety  Information A possible side effect of Coumadin (Warfarin) is an increased risk of bleeding. You should call your healthcare provider right away if you experience any of the following: ? Bleeding from an injury or your nose that does not stop. ? Unusual colored urine (red or dark brown) or unusual colored stools (red or black). ? Unusual bruising for unknown reasons. ? A serious fall or if you hit your head (even if there is no bleeding).  Some foods or medicines interact with Coumadin (warfarin) and might alter your response to warfarin. To help avoid this: ? Eat a balanced diet, maintaining a consistent amount of Vitamin K. ? Notify your provider about major diet changes you plan to make. ? Avoid alcohol or limit your intake to 1 drink for women and 2 drinks for men per day. (1 drink is 5 oz. wine, 12 oz. beer, or 1.5 oz. liquor.)  Make sure that ANY health care provider who prescribes medication for you knows that you are taking Coumadin (warfarin).  Also make sure the healthcare provider who is monitoring your Coumadin knows when you have started a new medication including herbals and non-prescription products.  Coumadin (Warfarin)  Major Drug Interactions  Increased Warfarin Effect Decreased Warfarin Effect  Alcohol (large quantities) Antibiotics (esp. Septra/Bactrim, Flagyl, Cipro) Amiodarone (Cordarone) Aspirin (ASA) Cimetidine (Tagamet) Megestrol (Megace) NSAIDs (ibuprofen, naproxen, etc.) Piroxicam (Feldene) Propafenone (Rythmol SR) Propranolol (Inderal) Isoniazid (INH) Posaconazole (Noxafil) Barbiturates (Phenobarbital) Carbamazepine (  Tegretol) Chlordiazepoxide (Librium) Cholestyramine (Questran) Griseofulvin Oral Contraceptives Rifampin Sucralfate (Carafate) Vitamin K   Coumadin (Warfarin) Major Herbal Interactions  Increased Warfarin Effect Decreased Warfarin Effect  Garlic Ginseng Ginkgo biloba Coenzyme Q10 Green tea St. Johns wort    Coumadin (Warfarin)  FOOD Interactions  Eat a consistent number of servings per week of foods HIGH in Vitamin K (1 serving =  cup)  Collards (cooked, or boiled & drained) Kale (cooked, or boiled & drained) Mustard greens (cooked, or boiled & drained) Parsley *serving size only =  cup Spinach (cooked, or boiled & drained) Swiss chard (cooked, or boiled & drained) Turnip greens (cooked, or boiled & drained)  Eat a consistent number of servings per week of foods MEDIUM-HIGH in Vitamin K (1 serving = 1 cup)  Asparagus (cooked, or boiled & drained) Broccoli (cooked, boiled & drained, or raw & chopped) Brussel sprouts (cooked, or boiled & drained) *serving size only =  cup Lettuce, raw (green leaf, endive, romaine) Spinach, raw Turnip greens, raw & chopped   These websites have more information on Coumadin (warfarin):  FailFactory.se; VeganReport.com.au;  INSTRUCTIONS AFTER JOINT REPLACEMENT   o Remove items at home which could result in a fall. This includes throw rugs or furniture in walking pathways o ICE to the affected joint every three hours while awake for 30 minutes at a time, for at least the first 3-5 days, and then as needed for pain and swelling.  Continue to use ice for pain and swelling. You may notice swelling that will progress down to the foot and ankle.  This is normal after surgery.  Elevate your leg when you are not up walking on it.   o Continue to use the breathing machine you got in the hospital (incentive spirometer) which will help keep your temperature down.  It is common for your temperature to cycle up and down following surgery, especially at night when you are not up moving around and exerting yourself.  The breathing machine keeps your lungs expanded and your temperature down.   DIET:  As you were doing prior to hospitalization, we recommend a well-balanced diet.  DRESSING / WOUND CARE / SHOWERING  Keep the surgical dressing until follow up.  The dressing is  water proof, so you can shower without any extra covering.  IF THE DRESSING FALLS OFF or the wound gets wet inside, change the dressing with sterile gauze.  Please use good hand washing techniques before changing the dressing.  Do not use any lotions or creams on the incision until instructed by your surgeon.    ACTIVITY  o Increase activity slowly as tolerated, but follow the weight bearing instructions below.   o No driving for 6 weeks or until further direction given by your physician.  You cannot drive while taking narcotics.  o No lifting or carrying greater than 10 lbs. until further directed by your surgeon. o Avoid periods of inactivity such as sitting longer than an hour when not asleep. This helps prevent blood clots.  o You may return to work once you are authorized by your doctor.     WEIGHT BEARING   Weight bearing as tolerated with assist device (walker, cane, etc) as directed, use it as long as suggested by your surgeon or therapist, typically at least 4-6 weeks.   EXERCISES  Results after joint replacement surgery are often greatly improved when you follow the exercise, range of motion and muscle strengthening exercises prescribed by your doctor. Safety measures are  also important to protect the joint from further injury. Any time any of these exercises cause you to have increased pain or swelling, decrease what you are doing until you are comfortable again and then slowly increase them. If you have problems or questions, call your caregiver or physical therapist for advice.   Rehabilitation is important following a joint replacement. After just a few days of immobilization, the muscles of the leg can become weakened and shrink (atrophy).  These exercises are designed to build up the tone and strength of the thigh and leg muscles and to improve motion. Often times heat used for twenty to thirty minutes before working out will loosen up your tissues and help with improving the  range of motion but do not use heat for the first two weeks following surgery (sometimes heat can increase post-operative swelling).   These exercises can be done on a training (exercise) mat, on the floor, on a table or on a bed. Use whatever works the best and is most comfortable for you.    Use music or television while you are exercising so that the exercises are a pleasant break in your day. This will make your life better with the exercises acting as a break in your routine that you can look forward to.   Perform all exercises about fifteen times, three times per day or as directed.  You should exercise both the operative leg and the other leg as well.  Exercises include:    Quad Sets - Tighten up the muscle on the front of the thigh (Quad) and hold for 5-10 seconds.    Straight Leg Raises - With your knee straight (if you were given a brace, keep it on), lift the leg to 60 degrees, hold for 3 seconds, and slowly lower the leg.  Perform this exercise against resistance later as your leg gets stronger.   Leg Slides: Lying on your back, slowly slide your foot toward your buttocks, bending your knee up off the floor (only go as far as is comfortable). Then slowly slide your foot back down until your leg is flat on the floor again.   Angel Wings: Lying on your back spread your legs to the side as far apart as you can without causing discomfort.   Hamstring Strength:  Lying on your back, push your heel against the floor with your leg straight by tightening up the muscles of your buttocks.  Repeat, but this time bend your knee to a comfortable angle, and push your heel against the floor.  You may put a pillow under the heel to make it more comfortable if necessary.   A rehabilitation program following joint replacement surgery can speed recovery and prevent re-injury in the future due to weakened muscles. Contact your doctor or a physical therapist for more information on knee rehabilitation.     CONSTIPATION  Constipation is defined medically as fewer than three stools per week and severe constipation as less than one stool per week.  Even if you have a regular bowel pattern at home, your normal regimen is likely to be disrupted due to multiple reasons following surgery.  Combination of anesthesia, postoperative narcotics, change in appetite and fluid intake all can affect your bowels.   YOU MUST use at least one of the following options; they are listed in order of increasing strength to get the job done.  They are all available over the counter, and you may need to use some, POSSIBLY even all  of these options:    Drink plenty of fluids (prune juice may be helpful) and high fiber foods Colace 100 mg by mouth twice a day  Senokot for constipation as directed and as needed Dulcolax (bisacodyl), take with full glass of water  Miralax (polyethylene glycol) once or twice a day as needed.  If you have tried all these things and are unable to have a bowel movement in the first 3-4 days after surgery call either your surgeon or your primary doctor.    If you experience loose stools or diarrhea, hold the medications until you stool forms back up.  If your symptoms do not get better within 1 week or if they get worse, check with your doctor.  If you experience "the worst abdominal pain ever" or develop nausea or vomiting, please contact the office immediately for further recommendations for treatment.   ITCHING:  If you experience itching with your medications, try taking only a single pain pill, or even half a pain pill at a time.  You can also use Benadryl over the counter for itching or also to help with sleep.   TED HOSE STOCKINGS:  Use stockings on both legs until for at least 2 weeks or as directed by physician office. They may be removed at night for sleeping.  MEDICATIONS:  See your medication summary on the After Visit Summary that nursing will review with you.  You may have some  home medications which will be placed on hold until you complete the course of blood thinner medication.  It is important for you to complete the blood thinner medication as prescribed.  PRECAUTIONS:  If you experience chest pain or shortness of breath - call 911 immediately for transfer to the hospital emergency department.   If you develop a fever greater that 101 F, purulent drainage from wound, increased redness or drainage from wound, foul odor from the wound/dressing, or calf pain - CONTACT YOUR SURGEON.                                                   FOLLOW-UP APPOINTMENTS:  If you do not already have a post-op appointment, please call the office for an appointment to be seen by your surgeon.  Guidelines for how soon to be seen are listed in your After Visit Summary, but are typically between 1-4 weeks after surgery.  OTHER INSTRUCTIONS:   Knee Replacement:  Do not place pillow under knee, focus on keeping the knee straight while resting. CPM instructions: 0-90 degrees, 2 hours in the morning, 2 hours in the afternoon, and 2 hours in the evening. Place foam block, curve side up under heel at all times except when in CPM or when walking.  DO NOT modify, tear, cut, or change the foam block in any way.  MAKE SURE YOU:   Understand these instructions.   Get help right away if you are not doing well or get worse.    Thank you for letting us be a part of your medical care team.  It is a privilege we respect greatly.  We hope these instructions will help you stay on track for a fast and full recovery!

## 2016-03-24 NOTE — Evaluation (Signed)
Occupational Therapy Evaluation Patient Details Name: Bruce Cole MRN: [id number] DOB: 1941/05/18 Today's Date: 03/24/2016    History of Present Illness Pt is a 75 y/o male s/p R THA (direct anterior approach). PMH including but not limited to ESRD, CKD stage 3, DM, HTN, A-fib, RA, CHF and hx of L THA in 2011.    Clinical Impression   Pt seated at EOB eating lunch upon arrival. Pt declined OOB to chair. Educated pt that he does not have hip precautions as he did with his previous THA. Pt is familiar with AE should he need it when he leaves rehab for LB ADL. Pt requiring assistance for R LE back into bed. Will defer further OT to SNF.    Follow Up Recommendations  SNF;Supervision/Assistance - 24 hour    Equipment Recommendations       Recommendations for Other Services       Precautions / Restrictions Precautions Precautions: Fall Restrictions Weight Bearing Restrictions: No RLE Weight Bearing: Weight bearing as tolerated      Mobility Bed Mobility Overal bed mobility: Needs Assistance Bed Mobility: Sit to Supine       Sit to supine: Min assist   General bed mobility comments: assisted R LE back into bed  Transfers                      Balance     Sitting balance-Leahy Scale: Good                                      ADL Overall ADL's : Needs assistance/impaired Eating/Feeding: Independent;Sitting   Grooming: Wash/dry hands;Wash/dry face;Sitting;Set up   Upper Body Bathing: Set up;Sitting   Lower Body Bathing: Moderate assistance;Sit to/from stand   Upper Body Dressing : Set up;Sitting   Lower Body Dressing: Moderate assistance;Sit to/from stand                       Vision     Perception     Praxis      Pertinent Vitals/Pain Pain Assessment: Faces Faces Pain Scale: Hurts even more Pain Location: R hip Pain Descriptors / Indicators: Sore;Grimacing;Guarding Pain Intervention(s): Monitored during  session;Repositioned;Ice applied     Hand Dominance Right   Extremity/Trunk Assessment Upper Extremity Assessment Upper Extremity Assessment: Overall WFL for tasks assessed   Lower Extremity Assessment Lower Extremity Assessment: Defer to PT evaluation       Communication Communication Communication: No difficulties   Cognition Arousal/Alertness: Awake/alert Behavior During Therapy: WFL for tasks assessed/performed Overall Cognitive Status: Within Functional Limits for tasks assessed                     General Comments       Exercises       Shoulder Instructions      Home Living Family/patient expects to be discharged to:: Skilled nursing facility Living Arrangements: Alone                                      Prior Functioning/Environment Level of Independence: Independent             OT Diagnosis: Generalized weakness;Acute pain   OT Problem List: Decreased strength;Decreased activity tolerance;Impaired balance (sitting and/or standing);Decreased knowledge of use of DME or AE;Pain  OT Treatment/Interventions:      OT Goals(Current goals can be found in the care plan section) Acute Rehab OT Goals Patient Stated Goal: go to rehab prior to returning home  OT Frequency:     Barriers to D/C:            Co-evaluation              End of Session    Activity Tolerance: Patient tolerated treatment well Patient left: in bed;with call bell/phone within reach   Time: 1315-1335 OT Time Calculation (min): 20 min Charges:  OT General Charges $OT Visit: 1 Procedure OT Evaluation $OT Eval Moderate Complexity: 1 Procedure G-Codes:    Malka So 03/24/2016, 2:19 PM  [phone]

## 2016-03-24 NOTE — Consult Note (Signed)
   [facility] CM Inpatient Consult   03/24/2016  Bruce Cole [birth year] [id number]   Patient screened for potential [facility] Care Management services. Chart reviewed. Noted discharge plan is for SNF. Spoke with inpatient RNCM to confirm discharge plan for SNF. Agrees there are no identifiable [facility] Care Management needs at this time. Also discussed it was difficult to reach [name] recently. Inpatient RNCM mentions that [name] could have been at HD during those times [place] Management tried to reach him. If patient's post hospital needs change, please place a [facility] Care Management consult. For questions please contact:  Marthenia Rolling, [place], RN,BSN [facility] Liaison [phone]

## 2016-03-24 NOTE — Plan of Care (Signed)
Problem: Skin Integrity: Goal: Risk for impaired skin integrity will decrease Outcome: Progressing Pt up in chair today and ambulated in room  Problem: Tissue Perfusion: Goal: Risk factors for ineffective tissue perfusion will decrease Outcome: Progressing Pt up in chair today and ambulated in room  Problem: Activity: Goal: Risk for activity intolerance will decrease Outcome: Progressing Pt up in chair today and ambulated in room

## 2016-03-24 NOTE — Progress Notes (Signed)
  [facility] Progress Note   Subjective: Hb 9.8 today, was 9.9 yest.  No active bleeding.   Vitals:   03/23/16 1100 03/23/16 1423 03/23/16 2052 03/24/16 0544  BP: (!) 107/57 (!) 106/58 106/65 (!) 106/57  Pulse: 68 66 71 66  Resp: 14 16 16 16   Temp: 98.5 F (36.9 C) 98.8 F (37.1 C) 98.4 F (36.9 C) 98.8 F (37.1 C)  TempSrc: Oral Oral Oral Oral  SpO2: 98% 99% 99% 99%  Weight: 101.3 kg (223 lb 5.2 oz)     Height:        Inpatient medications: . allopurinol  100 mg Oral BID  . docusate sodium  100 mg Oral BID  . doxercalciferol  1 mcg Intravenous Q M,W,F-HD  . ferric gluconate (FERRLECIT/NULECIT) IV  62.5 mg Intravenous Q Wed-HD  . hydroxychloroquine  200 mg Oral BID  . multivitamin  1 tablet Oral QHS  . sevelamer carbonate  800 mg Oral TID WC  . warfarin  7.5 mg Oral q1800  . Warfarin - Physician Dosing Inpatient   Does not apply q1800     acetaminophen **OR** acetaminophen, albuterol, diphenhydrAMINE, HYDROmorphone (DILAUDID) injection, lidocaine, menthol-cetylpyridinium **OR** phenol, methocarbamol **OR** methocarbamol (ROBAXIN)  IV, metoCLOPramide **OR** metoCLOPramide (REGLAN) injection, ondansetron **OR** ondansetron (ZOFRAN) IV, oxyCODONE, zolpidem  Exam: Gen alert, calm No jvd or bruits Chest clear bilat RRR no MRG Abd soft ntnd no mass or ascites +bs Ext R thigh edema 1-2+ localize, no other edema Neuro is alert, Ox 3 , nf LUA AVF +bruit HD cath removed this am  Dialysis: TTS AF 4h 102.5kg 2/2.25 bath Maturing BVT AVF done 11/23/15 ( IJ cath removed here) Hep 3000 Hect 1 Fe 50 mg/ wk Mircera 100 q 1, last 9/4  Assessment: 1.  S/P right hip THA 2.  ESRD started HD April '17, using AVF x 2-3 wks > IJ cath removed this am by [facility] 3.  Volume - only up 1-2kg, no excess on exam 4.  Hx syst CHF - compensated, EF 35-40% 5.  HTN no longer on medication 6.  Afib - coumadin restarted on 9/5, INR coming up 1.6 today. HR 60's, no rate-controlling  meds 7.  Gout 8.  RA on plaquenil 9.  Anemia of CKD - on ESA at center, had recent dose 9/4, Hb stable 9.9, no need for esa now  Plan - HD tomorrow AM, then resume TTS on Sat   Kelly Splinter MD [place] pager [phone]    cell [phone] 03/24/2016, 11:31 AM    Recent Labs Lab 03/22/16 1008 03/23/16 0601 03/23/16 0730  NA 132* 130* 129*  K 5.0 5.4* 5.1  CL  --  87* 87*  CO2  --  23 21*  GLUCOSE 103* 119* 117*  BUN  --  86* 87*  CREATININE  --  9.56* 9.37*  CALCIUM  --  9.2 9.0  PHOS  --   --  8.8*    Recent Labs Lab 03/23/16 0730  ALBUMIN 3.6    Recent Labs Lab 03/23/16 0601 03/23/16 0730 03/24/16 0339  WBC 10.8* 11.1* 13.5*  HGB 10.0* 9.9* 9.8*  HCT 31.2* 31.5* 31.4*  MCV 94.8 94.3 95.4  PLT 141* 133* 147*   Iron/TIBC/Ferritin/ %Sat    Component Value Date/Time   IRON 14 (L) 11/12/2015 0507   TIBC 392 11/12/2015 0507   FERRITIN 18 (L) 11/12/2015 0507   IRONPCTSAT 4 (L) 11/12/2015 0507

## 2016-03-25 DIAGNOSIS — K2901 Acute gastritis with bleeding: Secondary | ICD-10-CM | POA: Diagnosis not present

## 2016-03-25 DIAGNOSIS — E875 Hyperkalemia: Secondary | ICD-10-CM | POA: Diagnosis not present

## 2016-03-25 DIAGNOSIS — Z8719 Personal history of other diseases of the digestive system: Secondary | ICD-10-CM | POA: Diagnosis not present

## 2016-03-25 DIAGNOSIS — K5901 Slow transit constipation: Secondary | ICD-10-CM | POA: Diagnosis not present

## 2016-03-25 DIAGNOSIS — D508 Other iron deficiency anemias: Secondary | ICD-10-CM | POA: Diagnosis not present

## 2016-03-25 DIAGNOSIS — M1611 Unilateral primary osteoarthritis, right hip: Secondary | ICD-10-CM | POA: Diagnosis not present

## 2016-03-25 DIAGNOSIS — D638 Anemia in other chronic diseases classified elsewhere: Secondary | ICD-10-CM | POA: Diagnosis not present

## 2016-03-25 DIAGNOSIS — I120 Hypertensive chronic kidney disease with stage 5 chronic kidney disease or end stage renal disease: Secondary | ICD-10-CM | POA: Diagnosis not present

## 2016-03-25 DIAGNOSIS — Z471 Aftercare following joint replacement surgery: Secondary | ICD-10-CM | POA: Diagnosis present

## 2016-03-25 DIAGNOSIS — Z9181 History of falling: Secondary | ICD-10-CM | POA: Diagnosis not present

## 2016-03-25 DIAGNOSIS — J309 Allergic rhinitis, unspecified: Secondary | ICD-10-CM | POA: Diagnosis not present

## 2016-03-25 DIAGNOSIS — M1991 Primary osteoarthritis, unspecified site: Secondary | ICD-10-CM | POA: Diagnosis not present

## 2016-03-25 DIAGNOSIS — E871 Hypo-osmolality and hyponatremia: Secondary | ICD-10-CM | POA: Diagnosis not present

## 2016-03-25 DIAGNOSIS — D72829 Elevated white blood cell count, unspecified: Secondary | ICD-10-CM | POA: Diagnosis not present

## 2016-03-25 DIAGNOSIS — K5900 Constipation, unspecified: Secondary | ICD-10-CM | POA: Diagnosis not present

## 2016-03-25 DIAGNOSIS — E8770 Fluid overload, unspecified: Secondary | ICD-10-CM | POA: Diagnosis not present

## 2016-03-25 DIAGNOSIS — Z7951 Long term (current) use of inhaled steroids: Secondary | ICD-10-CM | POA: Diagnosis not present

## 2016-03-25 DIAGNOSIS — Z23 Encounter for immunization: Secondary | ICD-10-CM | POA: Diagnosis not present

## 2016-03-25 DIAGNOSIS — D649 Anemia, unspecified: Secondary | ICD-10-CM | POA: Diagnosis not present

## 2016-03-25 DIAGNOSIS — G4733 Obstructive sleep apnea (adult) (pediatric): Secondary | ICD-10-CM | POA: Diagnosis not present

## 2016-03-25 DIAGNOSIS — Z4789 Encounter for other orthopedic aftercare: Secondary | ICD-10-CM | POA: Diagnosis present

## 2016-03-25 DIAGNOSIS — D509 Iron deficiency anemia, unspecified: Secondary | ICD-10-CM | POA: Diagnosis not present

## 2016-03-25 DIAGNOSIS — K2970 Gastritis, unspecified, without bleeding: Secondary | ICD-10-CM | POA: Diagnosis not present

## 2016-03-25 DIAGNOSIS — M109 Gout, unspecified: Secondary | ICD-10-CM | POA: Diagnosis not present

## 2016-03-25 DIAGNOSIS — I4891 Unspecified atrial fibrillation: Secondary | ICD-10-CM | POA: Diagnosis not present

## 2016-03-25 DIAGNOSIS — R2689 Other abnormalities of gait and mobility: Secondary | ICD-10-CM | POA: Diagnosis present

## 2016-03-25 DIAGNOSIS — N186 End stage renal disease: Secondary | ICD-10-CM | POA: Diagnosis not present

## 2016-03-25 DIAGNOSIS — Z7189 Other specified counseling: Secondary | ICD-10-CM | POA: Diagnosis not present

## 2016-03-25 DIAGNOSIS — I482 Chronic atrial fibrillation: Secondary | ICD-10-CM | POA: Diagnosis not present

## 2016-03-25 DIAGNOSIS — M059 Rheumatoid arthritis with rheumatoid factor, unspecified: Secondary | ICD-10-CM | POA: Diagnosis not present

## 2016-03-25 DIAGNOSIS — D631 Anemia in chronic kidney disease: Secondary | ICD-10-CM | POA: Diagnosis not present

## 2016-03-25 DIAGNOSIS — Z96641 Presence of right artificial hip joint: Secondary | ICD-10-CM | POA: Diagnosis present

## 2016-03-25 DIAGNOSIS — Z7901 Long term (current) use of anticoagulants: Secondary | ICD-10-CM | POA: Diagnosis not present

## 2016-03-25 DIAGNOSIS — Z96643 Presence of artificial hip joint, bilateral: Secondary | ICD-10-CM | POA: Diagnosis not present

## 2016-03-25 DIAGNOSIS — D62 Acute posthemorrhagic anemia: Secondary | ICD-10-CM | POA: Diagnosis not present

## 2016-03-25 DIAGNOSIS — N2581 Secondary hyperparathyroidism of renal origin: Secondary | ICD-10-CM | POA: Diagnosis not present

## 2016-03-25 DIAGNOSIS — I5040 Unspecified combined systolic (congestive) and diastolic (congestive) heart failure: Secondary | ICD-10-CM | POA: Diagnosis not present

## 2016-03-25 DIAGNOSIS — R2681 Unsteadiness on feet: Secondary | ICD-10-CM | POA: Diagnosis not present

## 2016-03-25 DIAGNOSIS — I132 Hypertensive heart and chronic kidney disease with heart failure and with stage 5 chronic kidney disease, or end stage renal disease: Secondary | ICD-10-CM | POA: Diagnosis not present

## 2016-03-25 DIAGNOSIS — M25559 Pain in unspecified hip: Secondary | ICD-10-CM | POA: Diagnosis not present

## 2016-03-25 DIAGNOSIS — M069 Rheumatoid arthritis, unspecified: Secondary | ICD-10-CM | POA: Diagnosis not present

## 2016-03-25 DIAGNOSIS — R278 Other lack of coordination: Secondary | ICD-10-CM | POA: Diagnosis present

## 2016-03-25 DIAGNOSIS — M6281 Muscle weakness (generalized): Secondary | ICD-10-CM | POA: Diagnosis present

## 2016-03-25 DIAGNOSIS — Z992 Dependence on renal dialysis: Secondary | ICD-10-CM | POA: Diagnosis not present

## 2016-03-25 LAB — RENAL FUNCTION PANEL
Albumin: 3.1 g/dL — ABNORMAL LOW (ref 3.5–5.0)
Anion gap: 14 (ref 5–15)
BUN: 64 mg/dL — AB (ref 6–20)
CHLORIDE: 90 mmol/L — AB (ref 101–111)
CO2: 25 mmol/L (ref 22–32)
CREATININE: 9.03 mg/dL — AB (ref 0.61–1.24)
Calcium: 9.2 mg/dL (ref 8.9–10.3)
GFR calc Af Amer: 6 mL/min — ABNORMAL LOW (ref >60)
GFR, EST NON AFRICAN AMERICAN: 5 mL/min — AB (ref >60)
GLUCOSE: 115 mg/dL — AB (ref 65–99)
POTASSIUM: 5.5 mmol/L — AB (ref 3.5–5.1)
Phosphorus: 9.5 mg/dL — ABNORMAL HIGH (ref 2.5–4.6)
Sodium: 129 mmol/L — ABNORMAL LOW (ref 135–145)

## 2016-03-25 LAB — CBC
HCT: 30.1 % — ABNORMAL LOW (ref 39.0–52.0)
HEMOGLOBIN: 9.5 g/dL — AB (ref 13.0–17.0)
MCH: 30.1 pg (ref 26.0–34.0)
MCHC: 31.6 g/dL (ref 30.0–36.0)
MCV: 95.3 fL (ref 78.0–100.0)
PLATELETS: 169 10*3/uL (ref 150–400)
RBC: 3.16 MIL/uL — ABNORMAL LOW (ref 4.22–5.81)
RDW: 16.7 % — ABNORMAL HIGH (ref 11.5–15.5)
WBC: 12.6 10*3/uL — ABNORMAL HIGH (ref 4.0–10.5)

## 2016-03-25 LAB — PROTIME-INR
INR: 1.85
PROTHROMBIN TIME: 21.6 s — AB (ref 11.4–15.2)

## 2016-03-25 MED ORDER — OXYCODONE HCL 5 MG PO TABS
ORAL_TABLET | ORAL | Status: AC
  Filled 2016-03-25: qty 2

## 2016-03-25 MED ORDER — HEPARIN SODIUM (PORCINE) 1000 UNIT/ML DIALYSIS
1000.0000 [IU] | INTRAMUSCULAR | Status: DC | PRN
  Filled 2016-03-25: qty 1

## 2016-03-25 MED ORDER — PENTAFLUOROPROP-TETRAFLUOROETH EX AERO: 1.0000 "application " | INHALATION_SPRAY | CUTANEOUS | Status: DC | PRN

## 2016-03-25 MED ORDER — DOXERCALCIFEROL 4 MCG/2ML IV SOLN
INTRAVENOUS | Status: AC
  Filled 2016-03-25: qty 2

## 2016-03-25 MED ORDER — LIDOCAINE HCL (PF) 1 % IJ SOLN: 5.0000 mL | INTRAMUSCULAR | Status: DC | PRN

## 2016-03-25 MED ORDER — HEPARIN SODIUM (PORCINE) 1000 UNIT/ML DIALYSIS: 1500.0000 [IU] | Freq: Once | INTRAMUSCULAR | Status: DC

## 2016-03-25 MED ORDER — SODIUM CHLORIDE 0.9 % IV SOLN: 100.0000 mL | INTRAVENOUS | Status: DC | PRN

## 2016-03-25 MED ORDER — LIDOCAINE-PRILOCAINE 2.5-2.5 % EX CREA: 1.0000 "application " | TOPICAL_CREAM | CUTANEOUS | Status: DC | PRN

## 2016-03-25 MED ORDER — ALTEPLASE 2 MG IJ SOLR: 2.0000 mg | Freq: Once | INTRAMUSCULAR | Status: DC | PRN

## 2016-03-25 NOTE — Progress Notes (Signed)
PT Cancellation Note  Patient Details Name: Bruce Cole MRN: [id number] DOB: 10/16/1940   Cancelled Treatment:    Reason Eval/Treat Not Completed: Patient at procedure or test/unavailable (Pt off unit for hemodialysis.  )   Bruce Cole 03/25/2016, 12:32 PM  Governor Rooks, PTA pager [phone]

## 2016-03-25 NOTE — Care Management Important Message (Signed)
Important Message  Patient Details  Name: Bruce Cole MRN: [id number] Date of Birth: 08-16-1940   Medicare Important Message Given:  Yes    Mccabe Gloria Montine Circle 03/25/2016, 11:09 AM

## 2016-03-25 NOTE — Progress Notes (Signed)
Patient ID: Bruce Cole, male   DOB: 03-08-1941, 75 y.o.   MRN: [id number] Doing well.  Can be discharged to skilled nursing today.

## 2016-03-25 NOTE — Discharge Summary (Signed)
Patient ID: Bruce Cole MRN: [id number] DOB/AGE: [birth year] 75 y.o.  Admit date: 03/22/2016 Discharge date: 03/25/2016  Admission Diagnoses:  Principal Problem:   Osteoarthritis of right hip Active Problems:   Status post total replacement of right hip   Discharge Diagnoses:  Same  Past Medical History:  Diagnosis Date  . Acute blood loss anemia   . Atrial fibrillation (HCC)    Chronic  . Chronic combined systolic and diastolic heart failure ([place])   . Colon polyp 2000  . Dysrhythmia    hx  . ESRD on hemodialysis [facility])    Started HD April 2017, ESRD d/t cardiorenal syndrome  . Essential hypertension   . Gastritis and gastroduodenitis   . GI bleed   . Gout   . Heart murmur   . Nephrolithiasis   . OSA (obstructive sleep apnea) 09/02/2013    IMPRESSION :  1. Mild obstructive sleep apnea with hypopneas causing sleep fragmentation and moderate oxygen desaturation.  2. Short runs of nonsustained VT were noted. His beta blocker may need to be titrated 3. Significant PLM's were noted, the PLM arousal index was low. Please correlate with clinical history of restless leg syndrome.  4. Sleep efficiency was poor.  RECOMMENDATION:  1. Treatment options for this degree of sleep disordered breathing include weight loss and positional therapy to avoid supine sleep 2. Consider titrating beta blocker further, defer to cardiologist 3. Patient should be cautioned against driving when sleepy.They should be asked to avoid medications with sedative side effects     . Pneumonia    hx 30 yrs ago  . Primary osteoarthritis of right hip   . Rheumatoid arthritis ([place])   . Thrombocytopenia ([place])     Surgeries: Procedure(s): RIGHT TOTAL HIP ARTHROPLASTY ANTERIOR APPROACH on 03/22/2016   Consultants: Treatment Team:  Roney Jaffe, MD  Discharged Condition: Improved  Hospital Course: Bruce Cole is an 75 y.o. male who was admitted 03/22/2016 for operative treatment ofOsteoarthritis of  right hip. Patient has severe unremitting pain that affects sleep, daily activities, and work/hobbies. After pre-op clearance the patient was taken to the operating room on 03/22/2016 and underwent  Procedure(s): RIGHT TOTAL HIP ARTHROPLASTY ANTERIOR APPROACH.    Patient was given perioperative antibiotics: Anti-infectives    Start     Dose/Rate Route Frequency Ordered Stop   03/22/16 2200  hydroxychloroquine (PLAQUENIL) tablet 200 mg    Comments:  Reported on 01/13/2016     200 mg Oral 2 times daily 03/22/16 1706     03/22/16 2000  ceFAZolin (ANCEF) IVPB 1 g/50 mL premix     1 g 100 mL/hr over 30 Minutes Intravenous Every 6 hours 03/22/16 1706 03/23/16 0241   03/22/16 0939  ceFAZolin (ANCEF) 2-4 GM/100ML-% IVPB    Comments:  Rosenberger, Meredit: cabinet override      03/22/16 0939 03/22/16 2144   03/22/16 0932  ceFAZolin (ANCEF) IVPB 2g/100 mL premix     2 g 200 mL/hr over 30 Minutes Intravenous On call to O.R. 03/22/16 0932 03/22/16 1312       Patient was given sequential compression devices, early ambulation, and chemoprophylaxis to prevent DVT.  Patient benefited maximally from hospital stay and there were no complications.    Recent vital signs: Patient Vitals for the past 24 hrs:  BP Temp Temp src Pulse Resp SpO2  03/25/16 0502 (!) 112/52 98.9 F (37.2 C) Oral 60 16 100 %  03/24/16 2042 121/65 98.7 F (37.1 C) Oral 66 16 100 %  03/24/16 1441 (!) 99/51 98.4 F (36.9 C) Oral 68 18 98 %     Recent laboratory studies:  Recent Labs  03/23/16 0601 03/23/16 0730 03/24/16 0339 03/25/16 0442  WBC 10.8* 11.1* 13.5* 12.6*  HGB 10.0* 9.9* 9.8* 9.5*  HCT 31.2* 31.5* 31.4* 30.1*  PLT 141* 133* 147* 169  NA 130* 129*  --   --   K 5.4* 5.1  --   --   CL 87* 87*  --   --   CO2 23 21*  --   --   BUN 86* 87*  --   --   CREATININE 9.56* 9.37*  --   --   GLUCOSE 119* 117*  --   --   INR 1.32  --  1.61 1.85  CALCIUM 9.2 9.0  --   --      Discharge Medications:     Medication  List    TAKE these medications   acetaminophen 500 MG tablet Commonly known as:  TYLENOL Take 500 mg by mouth every 6 (six) hours as needed for mild pain.   albuterol (2.5 MG/3ML) 0.083% nebulizer solution Commonly known as:  PROVENTIL Take 2.5 mg by nebulization every 4 (four) hours as needed for shortness of breath. Reported on 09/25/2015   allopurinol 100 MG tablet Commonly known as:  ZYLOPRIM Take 2 tablets (200 mg total) by mouth daily. What changed:  how much to take  when to take this   fluticasone 50 MCG/ACT nasal spray Commonly known as:  FLONASE Place 2 sprays into both nostrils daily. What changed:  when to take this  reasons to take this   hydroxychloroquine 200 MG tablet Commonly known as:  PLAQUENIL Take 200 mg by mouth 2 (two) times daily. Reported on 01/13/2016   methocarbamol 500 MG tablet Commonly known as:  ROBAXIN Take 1 tablet (500 mg total) by mouth every 6 (six) hours as needed for muscle spasms.   oxyCODONE-acetaminophen 5-325 MG tablet Commonly known as:  ROXICET Take 1-2 tablets by mouth every 4 (four) hours as needed.   pantoprazole 40 MG tablet Commonly known as:  PROTONIX Take 1 tablet (40 mg total) by mouth 2 (two) times daily. What changed:  when to take this  reasons to take this   RENA-VITE RX 1 MG Tabs Take 1 tablet by mouth daily.   RENVELA 800 MG tablet Generic drug:  sevelamer carbonate Take 1 tablet by mouth 3 (three) times daily with meals.   warfarin 7.5 MG tablet Commonly known as:  COUMADIN TAKE 1 TABLET BY MOUTH DAILY AS DIRECTED BY COUMADIN CLINIC       Diagnostic Studies: Dg C-arm 61-120 Min  Result Date: 03/22/2016 CLINICAL DATA:  Status post right total hip replacement EXAM: DG C-ARM 61-120 MIN; OPERATIVE RIGHT HIP WITH PELVIS COMPARISON:  None. FLUOROSCOPY TIME:  0 minutes 49 seconds; 2 acquired images FINDINGS: Frontal view shows total hip replacements bilaterally. Prosthetic components appear well seated  on frontal view of the right hip. No acute fracture or dislocation. IMPRESSION: Prosthetic components appear well seated on the right. There is incomplete visualization of a total hip prosthesis on the left as well. No acute fracture or dislocation on frontal view. Electronically Signed   By: Lowella Grip III M.D.   On: 03/22/2016 15:04   Dg Hip Port Unilat With Pelvis 1v Right  Result Date: 03/22/2016 CLINICAL DATA:  75 year old male status post right hip arthroplasty. Initial encounter. EXAM: DG HIP (WITH OR WITHOUT PELVIS)  1V PORT RIGHT COMPARISON:  Intraoperative images from 1454 hours today. CT Abdomen and Pelvis 05/01/2015. FINDINGS: Portable supine AP and cross-table lateral views. The cross-table lateral view is inadequately penetrated to visualize all of the right hip arthroplasty hardware. On the AP view new right total hip arthroplasty hardware is in place and appears normally aligned. Visible hardware is intact. Preexisting left total hip arthroplasty hardware. Postoperative changes to the soft tissue surrounding the right hip including subcutaneous gas and skin staples. No unexpected osseous changes identified. IMPRESSION: Inadequately penetrated cross-table lateral view. Negative AP radiographic appearance of right total hip arthroplasty. Electronically Signed   By: Genevie Ann M.D.   On: 03/22/2016 16:19   Dg Hip Operative Unilat With Pelvis Right  Result Date: 03/22/2016 CLINICAL DATA:  Status post right total hip replacement EXAM: DG C-ARM 61-120 MIN; OPERATIVE RIGHT HIP WITH PELVIS COMPARISON:  None. FLUOROSCOPY TIME:  0 minutes 49 seconds; 2 acquired images FINDINGS: Frontal view shows total hip replacements bilaterally. Prosthetic components appear well seated on frontal view of the right hip. No acute fracture or dislocation. IMPRESSION: Prosthetic components appear well seated on the right. There is incomplete visualization of a total hip prosthesis on the left as well. No acute fracture  or dislocation on frontal view. Electronically Signed   By: Lowella Grip III M.D.   On: 03/22/2016 15:04    Disposition: 03-Skilled Nursing Facility  Discharge Instructions    Call MD / Call 911    Complete by:  As directed   If you experience chest pain or shortness of breath, CALL 911 and be transported to the hospital emergency room.  If you develope a fever above 101 F, pus (white drainage) or increased drainage or redness at the wound, or calf pain, call your surgeon's office.   Constipation Prevention    Complete by:  As directed   Drink plenty of fluids.  Prune juice may be helpful.  You may use a stool softener, such as Colace (over the counter) 100 mg twice a day.  Use MiraLax (over the counter) for constipation as needed.   Diet - low sodium heart healthy    Complete by:  As directed   Discharge patient    Complete by:  As directed   Increase activity slowly as tolerated    Complete by:  As directed      Follow-up [facility] .   Why:  Someone from [facility] will contact you to arrange start date and time for therapy.  Contact information: [address] [place] [address] [phone]        Mcarthur Rossetti, MD Follow up in 2 week(s).   Specialty:  Orthopedic Surgery Contact information: [place] [address] [phone]            Signed: Mcarthur Rossetti 03/25/2016, 6:56 AM

## 2016-03-25 NOTE — Progress Notes (Signed)
Called [facility] X 2 to give report to receiving nurse. Spoke to Blyn who indicated that she is the person at the front desk. I explained to her I was calling to give report and she transferred me to [place] [facility]. The phone rang for so long and eventually gave a busy tone and cut-off. Called back and explained to [place] about how I could not get in touch with the receiving nurse and patient needs to be discharged. Noma transferred me to the director of nursing and the phone rang for a while and went to the voice mail.  Sharyn Lull Risk analyst) was notified and she encouraged this Probation officer to call one more time. Called back and [place] transferred the call to [place] and said "am sorry but good [place] A[place]' The phone again rang and rang and eventually gave a busy signal. Will discharge patient to facility with instruction for staff to call for report.

## 2016-03-25 NOTE — Plan of Care (Signed)
Problem: Pain Managment: Goal: General experience of comfort will improve Outcome: Progressing Medicated once for pain this shift with full relief  Problem: Physical Regulation: Goal: Will remain free from infection Outcome: Progressing No s/s of infection noted  Problem: Bowel/Gastric: Goal: Will not experience complications related to bowel motility Outcome: Progressing No bowel issues noted

## 2016-03-25 NOTE — Clinical Social Work Placement (Signed)
   CLINICAL SOCIAL WORK PLACEMENT  NOTE  Date:  03/25/2016  Patient Details  Name: Bruce Cole MRN: [id number] Date of Birth: [birth year]  Clinical Social Work is seeking post-discharge placement for this patient at the   level of care (*CSW will initial, date and re-position this form in  chart as items are completed):  Yes   Patient/family provided with [facility] Work Department's list of facilities offering this level of care within the geographic area requested by the patient (or if unable, by the patient's family).  Yes   Patient/family informed of their freedom to choose among providers that offer the needed level of care, that participate in Medicare, Medicaid or managed care program needed by the patient, have an available bed and are willing to accept the patient.  Yes   Patient/family informed of [facility]'s ownership interest in [facility] and [facility], as well as of the fact that they are under no obligation to receive care at these facilities.  PASRR submitted to EDS on       PASRR number received on       Existing PASRR number confirmed on       FL2 transmitted to all facilities in geographic area requested by pt/family on       FL2 transmitted to all facilities within larger geographic area on       Patient informed that his/her managed care company has contracts with or will negotiate with certain facilities, including the following:        Yes   Patient/family informed of bed offers received.  Patient chooses bed at  [facility])     Physician recommends and patient chooses bed at      Patient to be transferred to  [facility]) on 03/25/16.  Patient to be transferred to facility by  [facility])     Patient family notified on 03/25/16 of transfer.  Name of family member notified:        PHYSICIAN       Additional Comment:    _______________________________________________ Tilda Burrow R 03/25/2016, 10:30 AM

## 2016-03-25 NOTE — Clinical Social Work Note (Signed)
Clinical Social Work Assessment  Patient Details  Name: Bruce Cole MRN: [id number] Date of Birth: 22-Jan-1941  Date of referral:  03/25/16               Reason for consult:  Facility Placement                Permission sought to share information with:   Acupuncturist) Permission granted to share information::   Database administrator)  Name::        Agency::     Relationship::     Contact Information:     Housing/Transportation Living arrangements for the past 2 months:  Single Family Home Source of Information:  Patient Patient Interpreter Needed:  None Criminal Activity/Legal Involvement Pertinent to Current Situation/Hospitalization:  No - Comment as needed Significant Relationships:    Lives with:  Self Do you feel safe going back to the place where you live?   (Patient is interested in facility.) Need for family participation in patient care:  Yes (Comment)  Care giving concerns:  Patient needs assistance with completing his ADLs.   Social Worker assessment / plan:  SW met with patient at bedside. Patient was alert and oriented. There was no family present. Patient states that he is interested in facility. SW referred pt to facility. Patient presented to hospital due to osteoarthritis of the L hip.  Employment status:  Retired Forensic scientist:  Medicare PT Recommendations:  [place] / Referral to community resources:   ([place].)  Patient/Family's Response to care:  Patient was appropriate.   Patient/Family's Understanding of and Emotional Response to Diagnosis, Current Treatment, and Prognosis:  Patient has no questions at this time.  Emotional Assessment Appearance:  Appears stated age Attitude/Demeanor/Rapport:   (Appropriate) Affect (typically observed):  Accepting Orientation:  Oriented to Self, Oriented to Place, Oriented to  Time, Oriented to Situation Alcohol / Substance use:  Not Applicable Psych involvement (Current and /or in  the community):  No (Comment)  Discharge Needs  Concerns to be addressed:  No discharge needs identified Readmission within the last 30 days:  No Current discharge risk:  None Barriers to Discharge:  No Barriers Identified   Bernita Buffy 03/25/2016, 10:34 AM

## 2016-03-25 NOTE — Procedures (Signed)
  I was present at this dialysis session, have reviewed the session itself and made  appropriate changes Kelly Splinter MD [place] pager [phone]    cell [phone] 03/25/2016, 12:21 PM

## 2016-03-25 NOTE — Progress Notes (Signed)
Physical Therapy Treatment Patient Details Name: Bruce Cole MRN: [id number] DOB: 1940/07/27 Today's Date: 03/25/2016    History of Present Illness Pt is a 75 y/o male s/p R THA (direct anterior approach). PMH including but not limited to ESRD, CKD stage 3, DM, HTN, A-fib, RA, CHF and hx of L THA in 2011.     PT Comments    Patient led through bilat LE therex and tolerated well. Declined OOB mobility despite max encouragement due to fatigue from HD this AM. Current plan remains appropriate.   Follow Up Recommendations  SNF     Equipment Recommendations  None recommended by PT    Recommendations for Other Services       Precautions / Restrictions Precautions Precautions: Fall Restrictions Weight Bearing Restrictions: Yes RLE Weight Bearing: Weight bearing as tolerated    Mobility  Bed Mobility Overal bed mobility: Needs Assistance Bed Mobility: Supine to Sit     Supine to sit: HOB elevated;Supervision     General bed mobility comments: cues for technique, increased time, and use of rail  Transfers                 General transfer comment: pt declined OOB due to fatigue and getting ready to transfer to [facility]   Ambulation/Gait                 Stairs            Wheelchair Mobility    Modified Rankin (Stroke Patients Only)       Balance     Sitting balance-Leahy Scale: Good                              Cognition Arousal/Alertness: Awake/alert Behavior During Therapy: WFL for tasks assessed/performed Overall Cognitive Status: Within Functional Limits for tasks assessed                      Exercises Total Joint Exercises Ankle Circles/Pumps: AROM;Both;15 reps;Supine Quad Sets: AROM;Both;10 reps;Supine Gluteal Sets: AROM;Both;10 reps;Supine Heel Slides: AROM;Both;10 reps;Supine Hip ABduction/ADduction: AROM;AAROM;Both;10 reps;Supine (AAROM R side) Long Arc Quad: AROM;Both;15 reps;Seated    General  Comments        Pertinent Vitals/Pain Pain Assessment: Faces Faces Pain Scale: Hurts little more Pain Location: R hip Pain Descriptors / Indicators: Aching;Sore Pain Intervention(s): Limited activity within patient's tolerance;Monitored during session;Premedicated before session;Repositioned    Home Living                      Prior Function            PT Goals (current goals can now be found in the care plan section) Acute Rehab PT Goals Patient Stated Goal: be independent PT Goal Formulation: With patient Time For Goal Achievement: 03/30/16 Potential to Achieve Goals: Good Progress towards PT goals: Progressing toward goals    Frequency  7X/week    PT Plan Current plan remains appropriate    Co-evaluation             End of Session Equipment Utilized During Treatment: Gait belt Activity Tolerance: Patient limited by fatigue Patient left: in bed;with call bell/phone within reach (sitting EOB eating dinner)     Time: 3785-8850 PT Time Calculation (min) (ACUTE ONLY): 18 min  Charges:  $Therapeutic Exercise: 8-22 mins  G Codes:      [place] April, PTA Pager: [phone]   03/25/2016, 4:45 PM

## 2016-03-25 NOTE — NC FL2 (Signed)
[place] LEVEL OF CARE SCREENING TOOL     IDENTIFICATION  Patient Name: Bruce Cole Birthdate: [birth year] Sex: male Admission Date (Current Location): 03/22/2016  [facility] and [place] Number:  Herbalist and Address:  [facility], [place] [address] [place], [place] [address]      Provider Number: [id number]  Attending Physician Name and Address:  Mcarthur Rossetti, *  Relative Name and Phone Number:       Current Level of Care:   Recommended Level of Care:   Prior Approval Number:    Date Approved/Denied:   PASRR Number:  [id number])  Discharge Plan: SNF    Current Diagnoses: Patient Active Problem List   Diagnosis Date Noted  . Osteoarthritis of right hip 03/22/2016  . Status post total replacement of right hip 03/22/2016  . Pre-operative cardiovascular examination 01/19/2016  . ESRD on dialysis ([place]) 11/27/2015  . Debility 11/27/2015  . Acute blood loss anemia   . Primary osteoarthritis of right hip   . S/P dialysis catheter insertion ([place])   . Gastritis and gastroduodenitis   . S/P thoracentesis   . Pleural effusion on right   . Encounter for therapeutic drug monitoring 09/02/2013  . OSA (obstructive sleep apnea) 09/02/2013  . Chronic combined systolic and diastolic heart failure ([place]) 03/16/2013  . Allergic rhinitis 12/18/2012  . Long term (current) use of anticoagulants 11/25/2010  . Genital herpes 05/31/2010  . DM (diabetes mellitus), type 2 with renal complications ([place]) 49/67/5916  . ERECTILE DYSFUNCTION, ORGANIC 09/21/2009  . Osteoarthritis 09/21/2009  . PERSONAL HX COLONIC POLYPS 08/25/2009  . Gout 07/22/2009  . Essential hypertension 07/22/2009  . ATRIAL FIBRILLATION 07/22/2009  . Rheumatoid arthritis ([place]) 07/22/2009  . NEPHROLITHIASIS, HX OF 07/22/2009    Orientation RESPIRATION BLADDER Height & Weight     Self, Time, Situation, Place  Normal Continent Weight: 227 lb 11.8 oz (103.3  kg) Height:  6\' 5"  (195.6 cm)  BEHAVIORAL SYMPTOMS/MOOD NEUROLOGICAL BOWEL NUTRITION STATUS      Continent Diet (Renal)  AMBULATORY STATUS COMMUNICATION OF NEEDS Skin     Verbally Normal                       Personal Care Assistance Level of Assistance  Bathing, Dressing Bathing Assistance: Limited assistance   Dressing Assistance: Limited assistance     Functional Limitations Info             SPECIAL CARE FACTORS FREQUENCY                       Contractures      Additional Factors Info  Code Status (Full)               Current Medications (03/25/2016):  This is the current hospital active medication list Current Facility-Administered Medications  Medication Dose Route Frequency Provider Last Rate Last Dose  . 0.9 %  sodium chloride infusion  100 mL Intravenous PRN Roney Jaffe, MD      . 0.9 %  sodium chloride infusion  100 mL Intravenous PRN Roney Jaffe, MD      . acetaminophen (TYLENOL) tablet 650 mg  650 mg Oral Q6H PRN Mcarthur Rossetti, MD       Or  . acetaminophen (TYLENOL) suppository 650 mg  650 mg Rectal Q6H PRN Mcarthur Rossetti, MD      . albuterol (PROVENTIL) (2.5 MG/3ML) 0.083% nebulizer solution 2.5  mg  2.5 mg Nebulization Q4H PRN Mcarthur Rossetti, MD      . allopurinol (ZYLOPRIM) tablet 100 mg  100 mg Oral BID Mcarthur Rossetti, MD   100 mg at 03/24/16 2132  . alteplase (CATHFLO ACTIVASE) injection 2 mg  2 mg Intracatheter Once PRN Roney Jaffe, MD      . diphenhydrAMINE (BENADRYL) 12.5 MG/5ML elixir 12.5-25 mg  12.5-25 mg Oral Q4H PRN Mcarthur Rossetti, MD      . docusate sodium (COLACE) capsule 100 mg  100 mg Oral BID Mcarthur Rossetti, MD   100 mg at 03/24/16 2132  . doxercalciferol (HECTOROL) injection 1 mcg  1 mcg Intravenous Q M,W,F-HD Roney Jaffe, MD   1 mcg at 03/23/16 1031  . ferric gluconate (NULECIT) 62.5 mg in sodium chloride 0.9 % 100 mL IVPB  62.5 mg Intravenous Q Wed-HD Roney Jaffe,  MD   62.5 mg at 03/23/16 1029  . heparin injection 1,000 Units  1,000 Units Dialysis PRN Roney Jaffe, MD      . Derrill Memo ON 03/26/2016] heparin injection 1,500 Units  1,500 Units Dialysis Once in dialysis Roney Jaffe, MD      . HYDROmorphone (DILAUDID) injection 1 mg  1 mg Intravenous Q2H PRN Mcarthur Rossetti, MD   1 mg at 03/23/16 0040  . hydroxychloroquine (PLAQUENIL) tablet 200 mg  200 mg Oral BID Mcarthur Rossetti, MD   200 mg at 03/24/16 2132  . lidocaine (PF) (XYLOCAINE) 1 % injection 5 mL  5 mL Intradermal PRN Roney Jaffe, MD      . lidocaine-prilocaine (EMLA) cream 1 application  1 application Topical PRN Roney Jaffe, MD      . methocarbamol (ROBAXIN) tablet 500 mg  500 mg Oral Q6H PRN Mcarthur Rossetti, MD   500 mg at 03/24/16 2300   Or  . methocarbamol (ROBAXIN) 500 mg in dextrose 5 % 50 mL IVPB  500 mg Intravenous Q6H PRN Mcarthur Rossetti, MD      . metoCLOPramide (REGLAN) tablet 5-10 mg  5-10 mg Oral Q8H PRN Mcarthur Rossetti, MD       Or  . metoCLOPramide (REGLAN) injection 5-10 mg  5-10 mg Intravenous Q8H PRN Mcarthur Rossetti, MD      . multivitamin (RENA-VIT) tablet 1 tablet  1 tablet Oral QHS Mcarthur Rossetti, MD   1 tablet at 03/24/16 2132  . ondansetron (ZOFRAN) tablet 4 mg  4 mg Oral Q6H PRN Mcarthur Rossetti, MD   4 mg at 03/24/16 1529   Or  . ondansetron (ZOFRAN) injection 4 mg  4 mg Intravenous Q6H PRN Mcarthur Rossetti, MD      . oxyCODONE (Oxy IR/ROXICODONE) immediate release tablet 5-10 mg  5-10 mg Oral Q3H PRN Mcarthur Rossetti, MD   10 mg at 03/24/16 2300  . pentafluoroprop-tetrafluoroeth (GEBAUERS) aerosol 1 application  1 application Topical PRN Roney Jaffe, MD      . phenol (CHLORASEPTIC) mouth spray 1 spray  1 spray Mouth/Throat PRN Mcarthur Rossetti, MD      . sevelamer carbonate (RENVELA) tablet 800 mg  800 mg Oral TID WC Mcarthur Rossetti, MD   800 mg at 03/24/16 1646  . warfarin  (COUMADIN) tablet 7.5 mg  7.5 mg Oral q1800 Mcarthur Rossetti, MD   7.5 mg at 03/24/16 1829  . Warfarin - Physician Dosing Inpatient   Does not apply E0100 Mcarthur Rossetti, MD      . zolpidem (  AMBIEN) tablet 5 mg  5 mg Oral QHS PRN Mcarthur Rossetti, MD         Discharge Medications: Please see discharge summary for a list of discharge medications.  Relevant Imaging Results:  Relevant Lab Results:   Additional Information  (SS: [id number])  Bernita Buffy

## 2016-03-26 DIAGNOSIS — N2581 Secondary hyperparathyroidism of renal origin: Secondary | ICD-10-CM | POA: Diagnosis not present

## 2016-03-26 DIAGNOSIS — N186 End stage renal disease: Secondary | ICD-10-CM | POA: Diagnosis not present

## 2016-03-26 DIAGNOSIS — D631 Anemia in chronic kidney disease: Secondary | ICD-10-CM | POA: Diagnosis not present

## 2016-03-26 DIAGNOSIS — Z23 Encounter for immunization: Secondary | ICD-10-CM | POA: Diagnosis not present

## 2016-03-26 DIAGNOSIS — D509 Iron deficiency anemia, unspecified: Secondary | ICD-10-CM | POA: Diagnosis not present

## 2016-03-26 DIAGNOSIS — I4891 Unspecified atrial fibrillation: Secondary | ICD-10-CM | POA: Diagnosis not present

## 2016-03-28 ENCOUNTER — Non-Acute Institutional Stay (SKILLED_NURSING_FACILITY): Payer: Medicare Other | Admitting: Adult Health

## 2016-03-28 ENCOUNTER — Encounter: Payer: Self-pay | Admitting: Adult Health

## 2016-03-28 DIAGNOSIS — M109 Gout, unspecified: Secondary | ICD-10-CM

## 2016-03-28 DIAGNOSIS — Z7901 Long term (current) use of anticoagulants: Secondary | ICD-10-CM

## 2016-03-28 DIAGNOSIS — M1611 Unilateral primary osteoarthritis, right hip: Secondary | ICD-10-CM | POA: Diagnosis not present

## 2016-03-28 DIAGNOSIS — Z992 Dependence on renal dialysis: Secondary | ICD-10-CM | POA: Diagnosis not present

## 2016-03-28 DIAGNOSIS — J309 Allergic rhinitis, unspecified: Secondary | ICD-10-CM | POA: Diagnosis not present

## 2016-03-28 DIAGNOSIS — I482 Chronic atrial fibrillation, unspecified: Secondary | ICD-10-CM

## 2016-03-28 DIAGNOSIS — R2681 Unsteadiness on feet: Secondary | ICD-10-CM | POA: Diagnosis not present

## 2016-03-28 DIAGNOSIS — D638 Anemia in other chronic diseases classified elsewhere: Secondary | ICD-10-CM

## 2016-03-28 DIAGNOSIS — M069 Rheumatoid arthritis, unspecified: Secondary | ICD-10-CM

## 2016-03-28 DIAGNOSIS — N186 End stage renal disease: Secondary | ICD-10-CM

## 2016-03-28 DIAGNOSIS — K5901 Slow transit constipation: Secondary | ICD-10-CM

## 2016-03-28 DIAGNOSIS — Z8719 Personal history of other diseases of the digestive system: Secondary | ICD-10-CM

## 2016-03-28 NOTE — Progress Notes (Signed)
Patient ID: Bruce Cole, male   DOB: 08-26-1940, 74 y.o.   MRN: [id number]    DATE:  03/28/2016   MRN:  [id number]  BIRTHDAY: 12/06/1940  Facility:  Nursing Home Location:  [place] Room Number: 412-I  LEVEL OF CARE:  SNF [phone])  Contact Information    Name Relation Home Work Mobile   Remmers,Hayward Son [phone]     Cudmore,[name] Daughter [phone]  [phone]   Chowning,Heather Daughter   [phone]       Code Status History    Date Active Date Inactive Code Status Order ID Comments User Context   03/22/2016  5:06 PM 03/25/2016  9:17 PM Full Code [id number]  Mcarthur Rossetti, MD Inpatient   11/10/2015  9:35 PM 11/25/2015  6:09 PM Full Code [id number]  Vianne Bulls, MD Inpatient   08/17/2015 10:05 PM 08/28/2015  5:09 PM Full Code [id number]  Norval Morton, MD Inpatient       Chief Complaint  Patient presents with  . Hospitalization Follow-up    HISTORY OF PRESENT ILLNESS:  This is a 75 year old male who has been admitted to [facility] on 03/25/16 from [facility]. He has PMH of AFib, chronic combined systolic and diastolic heart failure, dysrhythmia, ESRD, OSRD, GI bleed, Gout and RA. He has  Osteoarthritis of right hip for which he had right total hip arthroplasty, anterior approach on 03/22/16.  He has been admitted for a short-term rehabilitation.   He is seen in his room today and complained of constipation. He has requested to have bag of ice on his right hip to ease pain. He requested that his pain medication to be decreased.   PAST MEDICAL HISTORY:  Past Medical History:  Diagnosis Date  . Acute blood loss anemia   . Atrial fibrillation (HCC)    Chronic  . Chronic combined systolic and diastolic heart failure ([place])   . Colon polyp 2000  . Dysrhythmia    hx  . ESRD on hemodialysis [facility])    Started HD April 2017, ESRD d/t cardiorenal syndrome  . Essential hypertension   . Gastritis and  gastroduodenitis   . GI bleed   . Gout   . Heart murmur   . Nephrolithiasis   . OSA (obstructive sleep apnea) 09/02/2013    IMPRESSION :  1. Mild obstructive sleep apnea with hypopneas causing sleep fragmentation and moderate oxygen desaturation.  2. Short runs of nonsustained VT were noted. His beta blocker may need to be titrated 3. Significant PLM's were noted, the PLM arousal index was low. Please correlate with clinical history of restless leg syndrome.  4. Sleep efficiency was poor.  RECOMMENDATION:  1. Treatment options for this degree of sleep disordered breathing include weight loss and positional therapy to avoid supine sleep 2. Consider titrating beta blocker further, defer to cardiologist 3. Patient should be cautioned against driving when sleepy.They should be asked to avoid medications with sedative side effects     . Osteoarthritis of right hip   . Pneumonia    hx 30 yrs ago  . Primary osteoarthritis of right hip   . Rheumatoid arthritis ([place])   . Thrombocytopenia ([place])      CURRENT MEDICATIONS: Reviewed  Patient's Medications  New Prescriptions   No medications on file  Previous Medications   ACETAMINOPHEN (TYLENOL) 500 MG TABLET    Take 500 mg by mouth every 6 (six) hours as needed for mild pain.   ALBUTEROL (  PROVENTIL) (2.5 MG/3ML) 0.083% NEBULIZER SOLUTION    Take 2.5 mg by nebulization every 4 (four) hours as needed for shortness of breath. Reported on 09/25/2015   ALLOPURINOL (ZYLOPRIM) 100 MG TABLET    Take 2 tablets (200 mg total) by mouth daily.   B COMPLEX-C-FOLIC ACID (RENA-VITE RX) 1 MG TABS    Take 1 tablet by mouth daily.   BISACODYL (DULCOLAX) 10 MG SUPPOSITORY    Place 10 mg rectally as needed for moderate constipation.   FLUTICASONE (FLONASE) 50 MCG/ACT NASAL SPRAY    Place 2 sprays into both nostrils daily.   HYDROXYCHLOROQUINE (PLAQUENIL) 200 MG TABLET    Take 200 mg by mouth 2 (two) times daily. Reported on 01/13/2016   METHOCARBAMOL (ROBAXIN) 500 MG  TABLET    Take 1 tablet (500 mg total) by mouth every 6 (six) hours as needed for muscle spasms.   OXYCODONE-ACETAMINOPHEN (ROXICET) 5-325 MG TABLET    Take 1-2 tablets by mouth every 4 (four) hours as needed.   PANTOPRAZOLE (PROTONIX) 40 MG TABLET    Take 1 tablet (40 mg total) by mouth 2 (two) times daily.   RENVELA 800 MG TABLET    Take 1 tablet by mouth 3 (three) times daily with meals.   SENNOSIDES-DOCUSATE SODIUM (SENOKOT-S) 8.6-50 MG TABLET    Take 1 tablet by mouth 2 (two) times daily.   WARFARIN (COUMADIN) 7.5 MG TABLET    Take 7.5 mg by mouth daily.  Modified Medications   No medications on file  Discontinued Medications   WARFARIN (COUMADIN) 7.5 MG TABLET    TAKE 1 TABLET BY MOUTH DAILY AS DIRECTED BY COUMADIN CLINIC     Allergies  Allergen Reactions  . Ace Inhibitors Other (See Comments)    Worsening renal insufficiency     REVIEW OF SYSTEMS:  GENERAL: no change in appetite, no fatigue, no weight changes, no fever, chills or weakness EYES: Denies change in vision, dry eyes, eye pain, itching or discharge EARS: Denies change in hearing, ringing in ears, or earache NOSE: Denies nasal congestion or epistaxis MOUTH and THROAT: Denies oral discomfort, gingival pain or bleeding, pain from teeth or hoarseness   RESPIRATORY: no cough, SOB, DOE, wheezing, hemoptysis CARDIAC: no chest pain, edema or palpitations GI: no abdominal pain, diarrhea, constipation, heart burn, nausea or vomiting GU: Denies dysuria, frequency, hematuria, incontinence, or discharge PSYCHIATRIC: Denies feeling of depression or anxiety. No report of hallucinations, insomnia, paranoia, or agitation    PHYSICAL EXAMINATION  GENERAL APPEARANCE: Well nourished. In no acute distress. Normal body habitus SKIN: Right hip surgical incision is covered with aquacel dressing, dry, no redness HEAD: Normal in size and contour. No evidence of trauma EYES: Lids open and close normally. No blepharitis, entropion or  ectropion. PERRL. Conjunctivae are clear and sclerae are white. Lenses are without opacity EARS: Pinnae are normal. Patient hears normal voice tunes of the examiner MOUTH and THROAT: Lips are without lesions. Oral mucosa is moist and without lesions. Tongue is normal in shape, size, and color and without lesions NECK: supple, trachea midline, no neck masses, no thyroid tenderness, no thyromegaly LYMPHATICS: no LAN in the neck, no supraclavicular LAN RESPIRATORY: breathing is even & unlabored, BS CTAB CARDIAC: RRR, no murmur,no extra heart sounds, RLE 1+ edema GI: abdomen soft, normal BS, no masses, no tenderness, no hepatomegaly, no splenomegaly EXTREMITIES:  Able to move X 4 extremities, left upper arm AV fistula + bruit/thrill PSYCHIATRIC: Alert and oriented X 3. Affect and behavior are appropriate  LABS/RADIOLOGY: Labs reviewed: Basic Metabolic Panel:  Recent Labs  11/10/15 2144  11/24/15 0808  03/23/16 0601 03/23/16 0730 03/25/16 0643  NA  --   < > 133*  < > 130* 129* 129*  K  --   < > 3.6  < > 5.4* 5.1 5.5*  CL  --   < > 95*  < > 87* 87* 90*  CO2  --   < > 26  < > 23 21* 25  GLUCOSE  --   < > 118*  < > 119* 117* 115*  BUN  --   < > 31*  < > 86* 87* 64*  CREATININE  --   < > 5.63*  < > 9.56* 9.37* 9.03*  CALCIUM  --   < > 9.2  < > 9.2 9.0 9.2  MG 2.6*  --   --   --   --   --   --   PHOS  --   < > 4.2  --   --  8.8* 9.5*  < > = values in this interval not displayed. Liver Function Tests:  Recent Labs  08/20/15 0500 08/21/15 0400  11/10/15 1550  11/24/15 0808 03/23/16 0730 03/25/16 0643  AST 36 34  --  29  --   --   --   --   ALT 17 17  --  14*  --   --   --   --   ALKPHOS 99 102  --  114  --   --   --   --   BILITOT 0.7 0.8  --  0.7  --   --   --   --   PROT 6.9 7.3  --  7.1  --   --   --   --   ALBUMIN 3.8 3.6  < > 4.0  < > 3.4* 3.6 3.1*  < > = values in this interval not displayed.  Recent Labs  04/15/15 0853 11/14/15 0625  LIPASE 47 92*  AMYLASE  --  268*     CBC:  Recent Labs  11/10/15 1550  11/27/15 11/30/15  03/23/16 0730 03/24/16 0339 03/25/16 0442  WBC 5.9  < > 7.0 6.5  < > 11.1* 13.5* 12.6*  NEUTROABS 4.7  --  4 4  --   --   --   --   HGB 6.5*  < > 8.9* 8.7*  < > 9.9* 9.8* 9.5*  HCT 20.8*  < > 30* 29*  < > 31.5* 31.4* 30.1*  MCV 88.1  < >  --   --   < > 94.3 95.4 95.3  PLT 239  < > 110* 152  < > 133* 147* 169  < > = values in this interval not displayed.  Lipid Panel:  Recent Labs  02/03/16 1132  HDL 87.90   Cardiac Enzymes:  Recent Labs  08/18/15 0413 08/18/15 1112 11/10/15 1550  TROPONINI 0.08* 0.08* 0.10*   CBG:  Recent Labs  03/22/16 1525  GLUCAP 119*      Dg C-arm 61-120 Min  Result Date: 03/22/2016 CLINICAL DATA:  Status post right total hip replacement EXAM: DG C-ARM 61-120 MIN; OPERATIVE RIGHT HIP WITH PELVIS COMPARISON:  None. FLUOROSCOPY TIME:  0 minutes 49 seconds; 2 acquired images FINDINGS: Frontal view shows total hip replacements bilaterally. Prosthetic components appear well seated on frontal view of the right hip. No acute fracture or dislocation. IMPRESSION: Prosthetic components appear well seated on the right.  There is incomplete visualization of a total hip prosthesis on the left as well. No acute fracture or dislocation on frontal view. Electronically Signed   By: Lowella Grip III M.D.   On: 03/22/2016 15:04   Dg Hip Port Unilat With Pelvis 1v Right  Result Date: 03/22/2016 CLINICAL DATA:  75 year old male status post right hip arthroplasty. Initial encounter. EXAM: DG HIP (WITH OR WITHOUT PELVIS) 1V PORT RIGHT COMPARISON:  Intraoperative images from 1454 hours today. CT Abdomen and Pelvis 05/01/2015. FINDINGS: Portable supine AP and cross-table lateral views. The cross-table lateral view is inadequately penetrated to visualize all of the right hip arthroplasty hardware. On the AP view new right total hip arthroplasty hardware is in place and appears normally aligned. Visible hardware is  intact. Preexisting left total hip arthroplasty hardware. Postoperative changes to the soft tissue surrounding the right hip including subcutaneous gas and skin staples. No unexpected osseous changes identified. IMPRESSION: Inadequately penetrated cross-table lateral view. Negative AP radiographic appearance of right total hip arthroplasty. Electronically Signed   By: Genevie Ann M.D.   On: 03/22/2016 16:19   Dg Hip Operative Unilat With Pelvis Right  Result Date: 03/22/2016 CLINICAL DATA:  Status post right total hip replacement EXAM: DG C-ARM 61-120 MIN; OPERATIVE RIGHT HIP WITH PELVIS COMPARISON:  None. FLUOROSCOPY TIME:  0 minutes 49 seconds; 2 acquired images FINDINGS: Frontal view shows total hip replacements bilaterally. Prosthetic components appear well seated on frontal view of the right hip. No acute fracture or dislocation. IMPRESSION: Prosthetic components appear well seated on the right. There is incomplete visualization of a total hip prosthesis on the left as well. No acute fracture or dislocation on frontal view. Electronically Signed   By: Lowella Grip III M.D.   On: 03/22/2016 15:04    ASSESSMENT/PLAN:  Unsteady gait - for rehabilitation, PT and OT for therapeutic strengthening exercises; fall precaution  Right hip osteoarthritis S/P right total hip arthroplasty - for rehabilitation, PT and OT, for therapeutic strengthening  exercises; decrease Percocet 5/325 mg 1-2 tabs PO from Q 4 hours PRN to Q 6 hours PRN and continue Tylenol 500 mg 1 tab PO Q 6 hours PRN for pain; Robaxin 500 mg 1 tab PO Q 6 hours PRN for muscle spasm; Coumadin 7.5 mg daily for DVT prophylaxis; follow-up with Dr. Ninfa Linden, orthopedic surgeon, in 2 weeks  ESRD - on hemodialysis Q T-Th-Sat; continue Renvela 800 mg 1 tab  PO TID w/ meals  Constipation - increase Senna-S 8.6-50 mg 2 tabs PO BID and Miralax 17 gm PO BID; continue Dulcolax 10 mg suppository rectally daily PRN  RA - continue Plaquenil 200 mg 1 tab PO  BID  Hx of GI Bleed - continue Protonix 40 mg 1 tab PO BID  Gout - continue Zyloprim 100 mg give 2 tabs = 200 mg daily  Allergic rhinitis - continue Flonase 50 mcg 2 sprays into both nostrils daily  Anemia of chronic disease - and ABLA; check CBC Lab Results  Component Value Date   WBC 12.6 (H) 03/25/2016   HGB 9.5 (L) 03/25/2016   HCT 30.1 (L) 03/25/2016   MCV 95.3 03/25/2016   PLT 169 03/25/2016   Chronic atrial fibrillation - rate controlled; continue Coumadin 7.5 mg daily  Long-term use of anticoagulant - INR  1.9; continue Coumadin 7.5 mg daily and re-check INR on 03/31/16     Goals of care:  Short-term rehabilitation    Durenda Age, NP [facility] [phone]

## 2016-03-29 ENCOUNTER — Telehealth: Payer: Self-pay | Admitting: *Deleted

## 2016-03-29 ENCOUNTER — Non-Acute Institutional Stay (SKILLED_NURSING_FACILITY): Payer: Medicare Other | Admitting: Internal Medicine

## 2016-03-29 ENCOUNTER — Encounter: Payer: Self-pay | Admitting: Internal Medicine

## 2016-03-29 DIAGNOSIS — D509 Iron deficiency anemia, unspecified: Secondary | ICD-10-CM | POA: Diagnosis not present

## 2016-03-29 DIAGNOSIS — M059 Rheumatoid arthritis with rheumatoid factor, unspecified: Secondary | ICD-10-CM

## 2016-03-29 DIAGNOSIS — N2581 Secondary hyperparathyroidism of renal origin: Secondary | ICD-10-CM | POA: Diagnosis not present

## 2016-03-29 DIAGNOSIS — I482 Chronic atrial fibrillation, unspecified: Secondary | ICD-10-CM

## 2016-03-29 DIAGNOSIS — M1611 Unilateral primary osteoarthritis, right hip: Secondary | ICD-10-CM

## 2016-03-29 DIAGNOSIS — Z7189 Other specified counseling: Secondary | ICD-10-CM

## 2016-03-29 DIAGNOSIS — I4891 Unspecified atrial fibrillation: Secondary | ICD-10-CM | POA: Diagnosis not present

## 2016-03-29 DIAGNOSIS — R2681 Unsteadiness on feet: Secondary | ICD-10-CM | POA: Diagnosis not present

## 2016-03-29 DIAGNOSIS — D72829 Elevated white blood cell count, unspecified: Secondary | ICD-10-CM

## 2016-03-29 DIAGNOSIS — D62 Acute posthemorrhagic anemia: Secondary | ICD-10-CM | POA: Diagnosis not present

## 2016-03-29 DIAGNOSIS — E871 Hypo-osmolality and hyponatremia: Secondary | ICD-10-CM

## 2016-03-29 DIAGNOSIS — N186 End stage renal disease: Secondary | ICD-10-CM

## 2016-03-29 DIAGNOSIS — K5900 Constipation, unspecified: Secondary | ICD-10-CM

## 2016-03-29 DIAGNOSIS — Z992 Dependence on renal dialysis: Secondary | ICD-10-CM

## 2016-03-29 DIAGNOSIS — D631 Anemia in chronic kidney disease: Secondary | ICD-10-CM | POA: Diagnosis not present

## 2016-03-29 DIAGNOSIS — E875 Hyperkalemia: Secondary | ICD-10-CM

## 2016-03-29 DIAGNOSIS — K2990 Gastroduodenitis, unspecified, without bleeding: Secondary | ICD-10-CM

## 2016-03-29 DIAGNOSIS — K2970 Gastritis, unspecified, without bleeding: Secondary | ICD-10-CM | POA: Diagnosis not present

## 2016-03-29 DIAGNOSIS — Z23 Encounter for immunization: Secondary | ICD-10-CM | POA: Diagnosis not present

## 2016-03-29 NOTE — Telephone Encounter (Signed)
Pt called to inform [facility] that he was at [facility] for Rehab and they are managing his Coumadin/INR. He states it was checked yesterday & it was 1.9.  He stated he canceled appt for today & that someone made him make one for tomorrow, advised pt that he does not need to come see [place] because they are managing. I canceled pt's appt for tomorrow since [facility] is following.

## 2016-03-29 NOTE — Progress Notes (Signed)
LOCATION: [place]  PCP: Nance Pear., NP   Code Status: Full Code  Goals of care: Advanced Directive information Advanced Directives 03/11/2016  Does patient have an advance directive? No  Type of Advance Directive -  Does patient want to make changes to advanced directive? -  Copy of advanced directive(s) in chart? -  Would patient like information on creating an advanced directive? No - patient declined information       Extended Emergency Contact Information Primary Emergency Contact: Gittens,Hayward Address: [address] [place], [place] [address] Johnnette Litter of [place] Phone: [phone] Relation: Son Secondary Emergency Contact: [employer] of [place] Phone: [phone] Mobile Phone: [phone] Relation: Daughter   Allergies  Allergen Reactions  . Ace Inhibitors Other (See Comments)    Worsening renal insufficiency    Chief Complaint  Patient presents with  . New Admit To SNF    New Admission     HPI:  Patient is a 75 y.o. male seen today for short term rehabilitation post hospital admission from 03/22/16-03/25/16 with right hip osteoarthritis. He underwent right total hip arthroplasty. He had his hemodialysis this am. He also had a doctor's appointment this am but tells me he does not know which physician he went to. He is upset about his meals at the facility. He does not like service in this facility. He appears angry during this visit. He has PMH of chronic afib, ESRD on HD, HTN, OA.    Review of Systems:  Constitutional: Negative for fever. Energy level is fair.  HENT: Negative for headache, congestion, difficulty swallowing.   Eyes: Negative for double vision Respiratory: Negative for cough, shortness of breath Cardiovascular: Negative for chest pain, palpitation. Positive for leg swelling.  Gastrointestinal: Negative for heartburn, nausea, vomiting, abdominal  pain. Last bowel movement was yesterday. Denies rectal bleed.  Genitourinary: Negative for dysuria and flank pain.  Musculoskeletal: Negative for back pain, fall in the facility.  Skin: Negative for itching, rash.  Neurological: Negative for dizziness. Psychiatric/Behavioral: upset about care services at the facility, mentions that his daughter is a Chief Executive Officer and that he does not like this facility.   Past Medical History:  Diagnosis Date  . Acute blood loss anemia   . Atrial fibrillation (HCC)    Chronic  . Chronic combined systolic and diastolic heart failure ([place])   . Colon polyp 2000  . Dysrhythmia    hx  . ESRD on hemodialysis [facility])    Started HD April 2017, ESRD d/t cardiorenal syndrome  . Essential hypertension   . Gastritis and gastroduodenitis   . GI bleed   . Gout   . Heart murmur   . Nephrolithiasis   . OSA (obstructive sleep apnea) 09/02/2013    IMPRESSION :  1. Mild obstructive sleep apnea with hypopneas causing sleep fragmentation and moderate oxygen desaturation.  2. Short runs of nonsustained VT were noted. His beta blocker may need to be titrated 3. Significant PLM's were noted, the PLM arousal index was low. Please correlate with clinical history of restless leg syndrome.  4. Sleep efficiency was poor.  RECOMMENDATION:  1. Treatment options for this degree of sleep disordered breathing include weight loss and positional therapy to avoid supine sleep 2. Consider titrating beta blocker further, defer to cardiologist 3. Patient should be cautioned against driving when sleepy.They should be asked to  avoid medications with sedative side effects     . Osteoarthritis of right hip   . Pneumonia    hx 30 yrs ago  . Primary osteoarthritis of right hip   . Rheumatoid arthritis ([place])   . Thrombocytopenia ([place])    Past Surgical History:  Procedure Laterality Date  . AV FISTULA PLACEMENT Left 08/26/2015   Procedure: LEFT RADIOCEPHALIC FISTULA CREATION;  Surgeon: Rosetta Posner,  MD;  Location: [place];  Service: Vascular;  Laterality: Left;  . AV FISTULA PLACEMENT Left 11/23/2015   Procedure: ARTERIOVENOUS (AV) FISTULA CREATION;  Surgeon: Rosetta Posner, MD;  Location: [place];  Service: Vascular;  Laterality: Left;  . CHOLECYSTECTOMY  1994  . CYSTOSCOPY/RETROGRADE/URETEROSCOPY/STONE EXTRACTION WITH BASKET    . ESOPHAGOGASTRODUODENOSCOPY N/A 11/13/2015   Procedure: ESOPHAGOGASTRODUODENOSCOPY (EGD);  Surgeon: Irene Shipper, MD;  Location: [facility] ENDOSCOPY;  Service: Endoscopy;  Laterality: N/A;  . INSERTION OF DIALYSIS CATHETER Left 11/23/2015   Procedure: INSERTION OF DIALYSIS CATHETER;  Surgeon: Rosetta Posner, MD;  Location: [place];  Service: Vascular;  Laterality: Left;  . JOINT REPLACEMENT     Total L-Hip replacement, Right Knee 10/20/09  . LEFT AND RIGHT HEART CATHETERIZATION WITH CORONARY ANGIOGRAM N/A 02/22/2013   Procedure: LEFT AND RIGHT HEART CATHETERIZATION WITH CORONARY ANGIOGRAM;  Surgeon: Jolaine Artist, MD;  Location: [facility] CATH LAB;  Service: Cardiovascular;  Laterality: N/A;  . LITHOTRIPSY  90's  . PERIPHERAL VASCULAR CATHETERIZATION Left 11/19/2015   Procedure: A/V/Fistulagram;  Surgeon: Conrad Culberson, MD;  Location: [facility] CV LAB;  Service: Cardiovascular;  Laterality: Left;  . SPINE SURGERY     x 2  . TOTAL HIP ARTHROPLASTY Right 03/22/2016   Procedure: RIGHT TOTAL HIP ARTHROPLASTY ANTERIOR APPROACH;  Surgeon: Mcarthur Rossetti, MD;  Location: [place];  Service: Orthopedics;  Laterality: Right;   Social History:   reports that he has never smoked. He has never used smokeless tobacco. He reports that he drinks alcohol. He reports that he does not use drugs.  Family History  Problem Relation Age of Onset  . Hypertension Mother   . Arthritis Mother     ?RA  . Hypertension Father     Medications:   Medication List       Accurate as of 03/29/16 12:13 PM. Always use your most recent med list.          acetaminophen 500 MG tablet Commonly known as:   TYLENOL Take 500 mg by mouth every 6 (six) hours as needed for mild pain.   albuterol (2.5 MG/3ML) 0.083% nebulizer solution Commonly known as:  PROVENTIL Take 2.5 mg by nebulization every 4 (four) hours as needed for shortness of breath. Reported on 09/25/2015   allopurinol 100 MG tablet Commonly known as:  ZYLOPRIM Take 2 tablets (200 mg total) by mouth daily.   bisacodyl 10 MG suppository Commonly known as:  DULCOLAX Place 10 mg rectally daily as needed for moderate constipation. Stop date 03/29/16   fluticasone 50 MCG/ACT nasal spray Commonly known as:  FLONASE Place 2 sprays into both nostrils daily.   hydroxychloroquine 200 MG tablet Commonly known as:  PLAQUENIL Take 200 mg by mouth 2 (two) times daily. Reported on 01/13/2016   methocarbamol 500 MG tablet Commonly known as:  ROBAXIN Take 1 tablet (500 mg total) by mouth every 6 (six) hours as needed for muscle spasms.   oxyCODONE-acetaminophen 5-325 MG tablet Commonly known as:  PERCOCET/ROXICET Take 1-2 tablets by mouth every 6 (six) hours  as needed for moderate pain or severe pain.   pantoprazole 40 MG tablet Commonly known as:  PROTONIX Take 1 tablet (40 mg total) by mouth 2 (two) times daily.   polyethylene glycol packet Commonly known as:  MIRALAX / GLYCOLAX Take 17 g by mouth 2 (two) times daily.   RENA-VITE RX 1 MG Tabs Take 1 tablet by mouth daily.   RENVELA 800 MG tablet Generic drug:  sevelamer carbonate Take 1 tablet by mouth 3 (three) times daily with meals.   sennosides-docusate sodium 8.6-50 MG tablet Commonly known as:  SENOKOT-S Take 2 tablets by mouth 2 (two) times daily.   warfarin 7.5 MG tablet Commonly known as:  COUMADIN Take 7.5 mg by mouth daily.       Immunizations: Immunization History  Administered Date(s) Administered  . Influenza Split 05/07/2012  . Influenza, High Dose Seasonal PF 06/03/2013  . Influenza,inj,Quad PF,36+ Mos 03/17/2014, 05/05/2015  . PPD Test 11/25/2015    . Pneumococcal Conjugate-13 12/04/2013  . Pneumococcal Polysaccharide-23 09/20/2010  . Tdap 07/19/2011     Physical Exam:  Vitals:   03/29/16 1208  BP: 122/67  Pulse: 68  Resp: 20  Temp: 97.5 F (36.4 C)  TempSrc: Oral  SpO2: 96%  Weight: 227 lb (103 kg)  Height: 6\' 5"  (1.956 m)   Body mass index is 26.92 kg/m.  General- elderly male, well built, in no acute distress Head- normocephalic, atraumatic Nose- no nasal discharge Throat- moist mucus membrane Eyes- no pallor, no icterus, no discharge, normal conjunctiva, normal sclera Neck- no cervical lymphadenopathy Cardiovascular- normal s1,s2, + murmur, trace left leg edema and 1+ right leg edema Respiratory- bilateral clear to auscultation, no wheeze, no rhonchi, no crackles, no use of accessory muscles Abdomen- bowel sounds present, soft, non tender Musculoskeletal- able to move all 4 extremities, refused hip exam Neurological- alert and oriented to person, place and time Skin- warm and dry, LUE AV fistula with dressing in place. Refused hip exam. Per nursing staff he has right hip surgical incision with aquacel dressing that appears clean and dry Psychiatry- visibly upset, angry during conversation   Labs reviewed: Basic Metabolic Panel:  Recent Labs  11/10/15 2144  11/24/15 0808  03/23/16 0601 03/23/16 0730 03/25/16 0643  NA  --   < > 133*  < > 130* 129* 129*  K  --   < > 3.6  < > 5.4* 5.1 5.5*  CL  --   < > 95*  < > 87* 87* 90*  CO2  --   < > 26  < > 23 21* 25  GLUCOSE  --   < > 118*  < > 119* 117* 115*  BUN  --   < > 31*  < > 86* 87* 64*  CREATININE  --   < > 5.63*  < > 9.56* 9.37* 9.03*  CALCIUM  --   < > 9.2  < > 9.2 9.0 9.2  MG 2.6*  --   --   --   --   --   --   PHOS  --   < > 4.2  --   --  8.8* 9.5*  < > = values in this interval not displayed. Liver Function Tests:  Recent Labs  08/20/15 0500 08/21/15 0400  11/10/15 1550  11/24/15 0808 03/23/16 0730 03/25/16 0643  AST 36 34  --  29  --    --   --   --   ALT 17 17  --  14*  --   --   --   --   ALKPHOS 99 102  --  114  --   --   --   --   BILITOT 0.7 0.8  --  0.7  --   --   --   --   PROT 6.9 7.3  --  7.1  --   --   --   --   ALBUMIN 3.8 3.6  < > 4.0  < > 3.4* 3.6 3.1*  < > = values in this interval not displayed.  Recent Labs  04/15/15 0853 11/14/15 0625  LIPASE 47 92*  AMYLASE  --  268*   No results for input(s): AMMONIA in the last 8760 hours. CBC:  Recent Labs  11/10/15 1550  11/27/15 11/30/15  03/23/16 0730 03/24/16 0339 03/25/16 0442  WBC 5.9  < > 7.0 6.5  < > 11.1* 13.5* 12.6*  NEUTROABS 4.7  --  4 4  --   --   --   --   HGB 6.5*  < > 8.9* 8.7*  < > 9.9* 9.8* 9.5*  HCT 20.8*  < > 30* 29*  < > 31.5* 31.4* 30.1*  MCV 88.1  < >  --   --   < > 94.3 95.4 95.3  PLT 239  < > 110* 152  < > 133* 147* 169  < > = values in this interval not displayed. Cardiac Enzymes:  Recent Labs  08/18/15 0413 08/18/15 1112 11/10/15 1550  TROPONINI 0.08* 0.08* 0.10*   BNP: Invalid input(s): POCBNP CBG:  Recent Labs  03/22/16 1525  GLUCAP 119*    Radiological Exams: Dg C-arm 61-120 Min  Result Date: 03/22/2016 CLINICAL DATA:  Status post right total hip replacement EXAM: DG C-ARM 61-120 MIN; OPERATIVE RIGHT HIP WITH PELVIS COMPARISON:  None. FLUOROSCOPY TIME:  0 minutes 49 seconds; 2 acquired images FINDINGS: Frontal view shows total hip replacements bilaterally. Prosthetic components appear well seated on frontal view of the right hip. No acute fracture or dislocation. IMPRESSION: Prosthetic components appear well seated on the right. There is incomplete visualization of a total hip prosthesis on the left as well. No acute fracture or dislocation on frontal view. Electronically Signed   By: Lowella Grip III M.D.   On: 03/22/2016 15:04   Dg Hip Port Unilat With Pelvis 1v Right  Result Date: 03/22/2016 CLINICAL DATA:  75 year old male status post right hip arthroplasty. Initial encounter. EXAM: DG HIP (WITH OR  WITHOUT PELVIS) 1V PORT RIGHT COMPARISON:  Intraoperative images from 1454 hours today. CT Abdomen and Pelvis 05/01/2015. FINDINGS: Portable supine AP and cross-table lateral views. The cross-table lateral view is inadequately penetrated to visualize all of the right hip arthroplasty hardware. On the AP view new right total hip arthroplasty hardware is in place and appears normally aligned. Visible hardware is intact. Preexisting left total hip arthroplasty hardware. Postoperative changes to the soft tissue surrounding the right hip including subcutaneous gas and skin staples. No unexpected osseous changes identified. IMPRESSION: Inadequately penetrated cross-table lateral view. Negative AP radiographic appearance of right total hip arthroplasty. Electronically Signed   By: Genevie Ann M.D.   On: 03/22/2016 16:19   Dg Hip Operative Unilat With Pelvis Right  Result Date: 03/22/2016 CLINICAL DATA:  Status post right total hip replacement EXAM: DG C-ARM 61-120 MIN; OPERATIVE RIGHT HIP WITH PELVIS COMPARISON:  None. FLUOROSCOPY TIME:  0 minutes 49 seconds; 2 acquired images FINDINGS: Frontal view shows total hip replacements  bilaterally. Prosthetic components appear well seated on frontal view of the right hip. No acute fracture or dislocation. IMPRESSION: Prosthetic components appear well seated on the right. There is incomplete visualization of a total hip prosthesis on the left as well. No acute fracture or dislocation on frontal view. Electronically Signed   By: Lowella Grip III M.D.   On: 03/22/2016 15:04    Assessment/Plan  Gait instability Will have patient work with PT/OT as tolerated to regain strength and restore function.  Fall precautions are in place.  Right hip OA S/p right total hip arthroplasty on 03/22/16. Has orthopedic follow up. Will have him work with physical therapy and occupational therapy team to help with gait training and muscle strengthening exercises.fall precautions. Skin care.  Encourage to be out of bed. Continue roxicet 5-325 mg 1-2 tab q6h prn pain. Continue robaxin 500 mg q6h prn muscle spasm. Keep legs elevated at rest and monitor leg edema  Leukocytosis Afebrile, likely has reactive leukocytosis, monitor cbc with diff  Blood loss anemia Post surgery, monitor cbc  counselling and co-ordination of care Patient is upset this visit as mentioned above. counselled him saying that I would talk with management team about his dietary concerns and care at SNF. Patient is not happy with this suggestion and says he does not like being in this facility. He feels things cannot be addressed here. Suggested him to talk with his social worker to help him look into moving in another facility and he does not like that option. He then during the conversation tells me that I am unprofessional and does not feel he has benefited from this visit. He would like to be seen by his PCP instead and asks me to leave. I then excuse myself and along with my CMA leave the room. I have brought his concerns to DON's attention and she will talk to the patient. I will place RD consult to assess further as well.  Hyponatremia aaox 3, monitor bmp  Hyperkalemia With him on HD for ESRD, monitor k level  afib Rate controlled. Continue warfarin 7.5 mg daily. Monitor INR.  RA Continue his plaquenil current regimen  ESRD On HD 3 days a week, continue renavite and renvela.   Constipation On senokot s 2 tab bid with miralax bid, monitor. Continue dulcolax suppository 10 mg daily as needed  Gastritis and duodenitis Stable, Continue pantoprazole 40 mg bid    Goals of care: short term rehabilitation   Labs/tests ordered: cbc, cmp  Family/ staff Communication: reviewed care plan with nursing supervisor    Blanchie Serve, MD Internal Medicine [place], [place] [address] Cell Phone (Monday-Friday 8 am - 5 pm): [phone] On  Call: [phone] and follow prompts after 5 pm and on weekends Office Phone: [phone] Office Fax: [phone]

## 2016-03-31 DIAGNOSIS — D509 Iron deficiency anemia, unspecified: Secondary | ICD-10-CM | POA: Diagnosis not present

## 2016-03-31 DIAGNOSIS — I4891 Unspecified atrial fibrillation: Secondary | ICD-10-CM | POA: Diagnosis not present

## 2016-03-31 DIAGNOSIS — N186 End stage renal disease: Secondary | ICD-10-CM | POA: Diagnosis not present

## 2016-03-31 DIAGNOSIS — N2581 Secondary hyperparathyroidism of renal origin: Secondary | ICD-10-CM | POA: Diagnosis not present

## 2016-03-31 DIAGNOSIS — D631 Anemia in chronic kidney disease: Secondary | ICD-10-CM | POA: Diagnosis not present

## 2016-03-31 DIAGNOSIS — Z23 Encounter for immunization: Secondary | ICD-10-CM | POA: Diagnosis not present

## 2016-04-02 DIAGNOSIS — Z23 Encounter for immunization: Secondary | ICD-10-CM | POA: Diagnosis not present

## 2016-04-02 DIAGNOSIS — D509 Iron deficiency anemia, unspecified: Secondary | ICD-10-CM | POA: Diagnosis not present

## 2016-04-02 DIAGNOSIS — N186 End stage renal disease: Secondary | ICD-10-CM | POA: Diagnosis not present

## 2016-04-02 DIAGNOSIS — D631 Anemia in chronic kidney disease: Secondary | ICD-10-CM | POA: Diagnosis not present

## 2016-04-02 DIAGNOSIS — I4891 Unspecified atrial fibrillation: Secondary | ICD-10-CM | POA: Diagnosis not present

## 2016-04-02 DIAGNOSIS — N2581 Secondary hyperparathyroidism of renal origin: Secondary | ICD-10-CM | POA: Diagnosis not present

## 2016-04-05 DIAGNOSIS — Z23 Encounter for immunization: Secondary | ICD-10-CM | POA: Diagnosis not present

## 2016-04-05 DIAGNOSIS — I4891 Unspecified atrial fibrillation: Secondary | ICD-10-CM | POA: Diagnosis not present

## 2016-04-05 DIAGNOSIS — N2581 Secondary hyperparathyroidism of renal origin: Secondary | ICD-10-CM | POA: Diagnosis not present

## 2016-04-05 DIAGNOSIS — N186 End stage renal disease: Secondary | ICD-10-CM | POA: Diagnosis not present

## 2016-04-05 DIAGNOSIS — D631 Anemia in chronic kidney disease: Secondary | ICD-10-CM | POA: Diagnosis not present

## 2016-04-05 DIAGNOSIS — D509 Iron deficiency anemia, unspecified: Secondary | ICD-10-CM | POA: Diagnosis not present

## 2016-04-06 ENCOUNTER — Encounter: Payer: Self-pay | Admitting: Adult Health

## 2016-04-06 ENCOUNTER — Non-Acute Institutional Stay (SKILLED_NURSING_FACILITY): Payer: Medicare Other | Admitting: Adult Health

## 2016-04-06 DIAGNOSIS — I482 Chronic atrial fibrillation, unspecified: Secondary | ICD-10-CM

## 2016-04-06 DIAGNOSIS — K5901 Slow transit constipation: Secondary | ICD-10-CM | POA: Diagnosis not present

## 2016-04-06 DIAGNOSIS — N186 End stage renal disease: Secondary | ICD-10-CM

## 2016-04-06 DIAGNOSIS — J309 Allergic rhinitis, unspecified: Secondary | ICD-10-CM | POA: Diagnosis not present

## 2016-04-06 DIAGNOSIS — M109 Gout, unspecified: Secondary | ICD-10-CM

## 2016-04-06 DIAGNOSIS — K5900 Constipation, unspecified: Secondary | ICD-10-CM | POA: Diagnosis not present

## 2016-04-06 DIAGNOSIS — Z992 Dependence on renal dialysis: Secondary | ICD-10-CM | POA: Diagnosis not present

## 2016-04-06 DIAGNOSIS — D62 Acute posthemorrhagic anemia: Secondary | ICD-10-CM

## 2016-04-06 DIAGNOSIS — M1611 Unilateral primary osteoarthritis, right hip: Secondary | ICD-10-CM | POA: Diagnosis not present

## 2016-04-06 DIAGNOSIS — R2681 Unsteadiness on feet: Secondary | ICD-10-CM | POA: Diagnosis not present

## 2016-04-06 DIAGNOSIS — K2901 Acute gastritis with bleeding: Secondary | ICD-10-CM | POA: Diagnosis not present

## 2016-04-06 NOTE — Progress Notes (Signed)
Patient ID: Bruce Cole, male   DOB: [birth year], 75 y.o.   MRN: [id number]    DATE:  04/06/2016   MRN:  [id number]  BIRTHDAY: Feb 09, 1941  Facility:  Nursing Home Location:  [place] Room Number: 902-I  LEVEL OF CARE:  SNF [phone])  Contact Information    Name Relation Home Work Mobile   Sill,Hayward Son [phone]     P[place]chota,[name] Daughter [phone]   Eugene,Heather Daughter   [phone]       Code Status History    Date Active Date Inactive Code Status Order ID Comments User Context   03/22/2016  5:06 PM 03/25/2016  9:17 PM Full Code [id number]  Mcarthur Rossetti, MD Inpatient   11/10/2015  9:35 PM 11/25/2015  6:09 PM Full Code [id number]  Vianne Bulls, MD Inpatient   08/17/2015 10:05 PM 08/28/2015  5:09 PM Full Code [id number]  Norval Morton, MD Inpatient       Chief Complaint  Patient presents with  . Discharge Note    HISTORY OF PRESENT ILLNESS:  This is a 75 year old male who is for discharge home with Home health PT and OT. DME:  Rolling walker  .  He has been admitted to [facility] [place] on 03/25/16 from [facility] He has PMH of AFib, chronic combined systolic and diastolic heart failure, dysrhythmia, ESRD, OSRD, GI bleed, Gout and RA. He has  Osteoarthritis of right hip for which he had right total hip arthroplasty, anterior approach on 03/22/16.  Patient was admitted to this facility for short-term rehabilitation after the patient's recent hospitalization.  Patient has completed SNF rehabilitation and therapy has cleared the patient for discharge.   PAST MEDICAL HISTORY:  Past Medical History:  Diagnosis Date  . Acute blood loss anemia   . Atrial fibrillation (HCC)    Chronic  . Chronic combined systolic and diastolic heart failure ([place])   . Colon polyp 2000  . Dysrhythmia    hx  . ESRD on hemodialysis [facility])    Started HD April 2017, ESRD d/t cardiorenal syndrome  . Essential  hypertension   . Gastritis and gastroduodenitis   . GI bleed   . Gout   . Heart murmur   . Nephrolithiasis   . OSA (obstructive sleep apnea) 09/02/2013    IMPRESSION :  1. Mild obstructive sleep apnea with hypopneas causing sleep fragmentation and moderate oxygen desaturation.  2. Short runs of nonsustained VT were noted. His beta blocker may need to be titrated 3. Significant PLM's were noted, the PLM arousal index was low. Please correlate with clinical history of restless leg syndrome.  4. Sleep efficiency was poor.  RECOMMENDATION:  1. Treatment options for this degree of sleep disordered breathing include weight loss and positional therapy to avoid supine sleep 2. Consider titrating beta blocker further, defer to cardiologist 3. Patient should be cautioned against driving when sleepy.They should be asked to avoid medications with sedative side effects     . Osteoarthritis of right hip   . Pneumonia    hx 30 yrs ago  . Primary osteoarthritis of right hip   . Rheumatoid arthritis ([place])   . Thrombocytopenia ([place])      CURRENT MEDICATIONS: Reviewed  Patient's Medications  New Prescriptions   No medications on file  Previous Medications   ACETAMINOPHEN (TYLENOL) 500 MG TABLET    Take 500 mg by mouth every 6 (six) hours as needed for mild pain.  ALBUTEROL (PROVENTIL) (2.5 MG/3ML) 0.083% NEBULIZER SOLUTION    Take 2.5 mg by nebulization every 4 (four) hours as needed for shortness of breath. Reported on 09/25/2015   ALLOPURINOL (ZYLOPRIM) 100 MG TABLET    Take 2 tablets (200 mg total) by mouth daily.   B COMPLEX-C-FOLIC ACID (RENA-VITE RX) 1 MG TABS    Take 1 tablet by mouth daily.   FLUTICASONE (FLONASE) 50 MCG/ACT NASAL SPRAY    Place 2 sprays into both nostrils daily.   HYDROXYCHLOROQUINE (PLAQUENIL) 200 MG TABLET    Take 200 mg by mouth 2 (two) times daily. Reported on 01/13/2016   METHOCARBAMOL (ROBAXIN) 500 MG TABLET    Take 1 tablet (500 mg total) by mouth every 6 (six) hours as  needed for muscle spasms.   NUTRITIONAL SUPPLEMENTS (FEEDING SUPPLEMENT, NEPRO CARB STEADY,) LIQD    Take 237 mLs by mouth daily.   OXYCODONE-ACETAMINOPHEN (PERCOCET/ROXICET) 5-325 MG TABLET    Take 1-2 tablets by mouth every 6 (six) hours as needed for moderate pain or severe pain.   PANTOPRAZOLE (PROTONIX) 40 MG TABLET    Take 1 tablet (40 mg total) by mouth 2 (two) times daily.   POLYETHYLENE GLYCOL (MIRALAX / GLYCOLAX) PACKET    Take 17 g by mouth 2 (two) times daily.   RENVELA 800 MG TABLET    Take 1 tablet by mouth 3 (three) times daily with meals.   SENNOSIDES-DOCUSATE SODIUM (SENOKOT-S) 8.6-50 MG TABLET    Take 2 tablets by mouth 2 (two) times daily.   WARFARIN (COUMADIN) 4 MG TABLET    Take 8 mg by mouth. Take 2 tablets to = 8 mg every Thursday   WARFARIN (COUMADIN) 7.5 MG TABLET    Take 7.5 mg by mouth. Mon-Tue-Wed-Fri-Sat-Sun  Modified Medications   No medications on file  Discontinued Medications   BISACODYL (DULCOLAX) 10 MG SUPPOSITORY    Place 10 mg rectally daily as needed for moderate constipation. Stop date 03/29/16     Allergies  Allergen Reactions  . Ace Inhibitors Other (See Comments)    Worsening renal insufficiency     REVIEW OF SYSTEMS:  GENERAL: no change in appetite, no fatigue, no weight changes, no fever, chills or weakness EYES: Denies change in vision, dry eyes, eye pain, itching or discharge EARS: Denies change in hearing, ringing in ears, or earache NOSE: Denies nasal congestion or epistaxis MOUTH and THROAT: Denies oral discomfort, gingival pain or bleeding, pain from teeth or hoarseness   RESPIRATORY: no cough, SOB, DOE, wheezing, hemoptysis CARDIAC: no chest pain, edema or palpitations GI: no abdominal pain, diarrhea, constipation, heart burn, nausea or vomiting GU: Denies dysuria, frequency, hematuria, incontinence, or discharge PSYCHIATRIC: Denies feeling of depression or anxiety. No report of hallucinations, insomnia, paranoia, or  agitation    PHYSICAL EXAMINATION  GENERAL APPEARANCE: Well nourished. In no acute distress. Normal body habitus SKIN: Right hip surgical incision is covered with aquacel dressing, dry, no redness HEAD: Normal in size and contour. No evidence of trauma EYES: Lids open and close normally. No blepharitis, entropion or ectropion. PERRL. Conjunctivae are clear and sclerae are white. Lenses are without opacity EARS: Pinnae are normal. Patient hears normal voice tunes of the examiner MOUTH and THROAT: Lips are without lesions. Oral mucosa is moist and without lesions. Tongue is normal in shape, size, and color and without lesions NECK: supple, trachea midline, no neck masses, no thyroid tenderness, no thyromegaly LYMPHATICS: no LAN in the neck, no supraclavicular LAN RESPIRATORY: breathing is even &  unlabored, BS CTAB CARDIAC: RRR, no murmur,no extra heart sounds, RLE 1+ edema GI: abdomen soft, normal BS, no masses, no tenderness, no hepatomegaly, no splenomegaly EXTREMITIES:  Able to move X 4 extremities, left upper arm AV fistula + bruit/thrill PSYCHIATRIC: Alert and oriented X 3. Affect and behavior are appropriate  LABS/RADIOLOGY: Labs reviewed: Basic Metabolic Panel:  Recent Labs  11/10/15 2144  11/24/15 0808  03/23/16 0601 03/23/16 0730 03/25/16 0643  NA  --   < > 133*  < > 130* 129* 129*  K  --   < > 3.6  < > 5.4* 5.1 5.5*  CL  --   < > 95*  < > 87* 87* 90*  CO2  --   < > 26  < > 23 21* 25  GLUCOSE  --   < > 118*  < > 119* 117* 115*  BUN  --   < > 31*  < > 86* 87* 64*  CREATININE  --   < > 5.63*  < > 9.56* 9.37* 9.03*  CALCIUM  --   < > 9.2  < > 9.2 9.0 9.2  MG 2.6*  --   --   --   --   --   --   PHOS  --   < > 4.2  --   --  8.8* 9.5*  < > = values in this interval not displayed. Liver Function Tests:  Recent Labs  08/20/15 0500 08/21/15 0400  11/10/15 1550  11/24/15 0808 03/23/16 0730 03/25/16 0643  AST 36 34  --  29  --   --   --   --   ALT 17 17  --  14*  --    --   --   --   ALKPHOS 99 102  --  114  --   --   --   --   BILITOT 0.7 0.8  --  0.7  --   --   --   --   PROT 6.9 7.3  --  7.1  --   --   --   --   ALBUMIN 3.8 3.6  < > 4.0  < > 3.4* 3.6 3.1*  < > = values in this interval not displayed.  Recent Labs  04/15/15 0853 11/14/15 0625  LIPASE 47 92*  AMYLASE  --  268*    CBC:  Recent Labs  11/10/15 1550  11/27/15 11/30/15  03/23/16 0730 03/24/16 0339 03/25/16 0442  WBC 5.9  < > 7.0 6.5  < > 11.1* 13.5* 12.6*  NEUTROABS 4.7  --  4 4  --   --   --   --   HGB 6.5*  < > 8.9* 8.7*  < > 9.9* 9.8* 9.5*  HCT 20.8*  < > 30* 29*  < > 31.5* 31.4* 30.1*  MCV 88.1  < >  --   --   < > 94.3 95.4 95.3  PLT 239  < > 110* 152  < > 133* 147* 169  < > = values in this interval not displayed.  Lipid Panel:  Recent Labs  02/03/16 1132  HDL 87.90   Cardiac Enzymes:  Recent Labs  08/18/15 0413 08/18/15 1112 11/10/15 1550  TROPONINI 0.08* 0.08* 0.10*   CBG:  Recent Labs  03/22/16 1525  GLUCAP 119*      Dg C-arm 61-120 Min  Result Date: 03/22/2016 CLINICAL DATA:  Status post right total hip replacement EXAM: DG C-ARM 61-120  MIN; OPERATIVE RIGHT HIP WITH PELVIS COMPARISON:  None. FLUOROSCOPY TIME:  0 minutes 49 seconds; 2 acquired images FINDINGS: Frontal view shows total hip replacements bilaterally. Prosthetic components appear well seated on frontal view of the right hip. No acute fracture or dislocation. IMPRESSION: Prosthetic components appear well seated on the right. There is incomplete visualization of a total hip prosthesis on the left as well. No acute fracture or dislocation on frontal view. Electronically Signed   By: Lowella Grip III M.D.   On: 03/22/2016 15:04   Dg Hip Port Unilat With Pelvis 1v Right  Result Date: 03/22/2016 CLINICAL DATA:  75 year old male status post right hip arthroplasty. Initial encounter. EXAM: DG HIP (WITH OR WITHOUT PELVIS) 1V PORT RIGHT COMPARISON:  Intraoperative images from 1454 hours  today. CT Abdomen and Pelvis 05/01/2015. FINDINGS: Portable supine AP and cross-table lateral views. The cross-table lateral view is inadequately penetrated to visualize all of the right hip arthroplasty hardware. On the AP view new right total hip arthroplasty hardware is in place and appears normally aligned. Visible hardware is intact. Preexisting left total hip arthroplasty hardware. Postoperative changes to the soft tissue surrounding the right hip including subcutaneous gas and skin staples. No unexpected osseous changes identified. IMPRESSION: Inadequately penetrated cross-table lateral view. Negative AP radiographic appearance of right total hip arthroplasty. Electronically Signed   By: Genevie Ann M.D.   On: 03/22/2016 16:19   Dg Hip Operative Unilat With Pelvis Right  Result Date: 03/22/2016 CLINICAL DATA:  Status post right total hip replacement EXAM: DG C-ARM 61-120 MIN; OPERATIVE RIGHT HIP WITH PELVIS COMPARISON:  None. FLUOROSCOPY TIME:  0 minutes 49 seconds; 2 acquired images FINDINGS: Frontal view shows total hip replacements bilaterally. Prosthetic components appear well seated on frontal view of the right hip. No acute fracture or dislocation. IMPRESSION: Prosthetic components appear well seated on the right. There is incomplete visualization of a total hip prosthesis on the left as well. No acute fracture or dislocation on frontal view. Electronically Signed   By: Lowella Grip III M.D.   On: 03/22/2016 15:04    ASSESSMENT/PLAN:  Unsteady gait - for Home health PT and OT, for therapeutic strengthening exercises; fall precaution  Right hip osteoarthritis S/P right total hip arthroplasty - for Home health PT and OT, for therapeutic strengthening  exercises; continue Percocet 5/325 mg 1-2 tabs PO from Q 6 hours PRN and continue Tylenol 500 mg 1 tab PO Q 6 hours PRN for pain; Robaxin 500 mg 1 tab PO Q 6 hours PRN for muscle spasm; Coumadin 7.5 mg 1 tab PO Q M-T-W-F-Sat and Sun and 8 mg PO Q  Thursdays for DVT prophylaxis; follow-up with Dr. Ninfa Linden, orthopedic surgeon  ESRD - on hemodialysis Q T-Th-Sat; continue Renvela 800 mg 1 tab  PO TID w/ meals  Constipation - continue Senna-S 8.6-50 mg 2 tabs PO BID,  Miralax 17 gm PO BID and Dulcolax 10 mg suppository rectally daily PRN  RA - continue Plaquenil 200 mg 1 tab PO BID  Hx of GI Bleed - continue Protonix 40 mg 1 tab PO BID  Gout - continue Zyloprim 100 mg give 2 tabs = 200 mg daily  Allergic rhinitis - continue Flonase 50 mcg 2 sprays into both nostrils daily  Anemia of chronic disease - and ABLA; re-check CBC result not available for review Lab Results  Component Value Date   WBC 12.6 (H) 03/25/2016   HGB 9.5 (L) 03/25/2016   HCT 30.1 (L) 03/25/2016  MCV 95.3 03/25/2016   PLT 169 03/25/2016   Chronic atrial fibrillation - rate controlled; continue Coumadin 7.5 mg 1 tab PO Q M-T-W-F-Sat and Sun and 8 mg PO Q Thursdays; check INR on 04/08/16       I have filled out patient's discharge paperwork and written prescriptions.  Patient will receive home health PT and OT.  DME provided:  Rolling walker  Total discharge time: Greater than 30 minutes Greater than 50% was spent in counseling and coordination of care with the patient.   Discharge time involved coordination of the discharge process with social worker, nursing staff and therapy department. Medical justification for home health services/DME verified.    Durenda Age, NP [employer] [phone]

## 2016-04-07 DIAGNOSIS — D631 Anemia in chronic kidney disease: Secondary | ICD-10-CM | POA: Diagnosis not present

## 2016-04-07 DIAGNOSIS — D509 Iron deficiency anemia, unspecified: Secondary | ICD-10-CM | POA: Diagnosis not present

## 2016-04-07 DIAGNOSIS — Z23 Encounter for immunization: Secondary | ICD-10-CM | POA: Diagnosis not present

## 2016-04-07 DIAGNOSIS — I4891 Unspecified atrial fibrillation: Secondary | ICD-10-CM | POA: Diagnosis not present

## 2016-04-07 DIAGNOSIS — N2581 Secondary hyperparathyroidism of renal origin: Secondary | ICD-10-CM | POA: Diagnosis not present

## 2016-04-07 DIAGNOSIS — N186 End stage renal disease: Secondary | ICD-10-CM | POA: Diagnosis not present

## 2016-04-08 DIAGNOSIS — I132 Hypertensive heart and chronic kidney disease with heart failure and with stage 5 chronic kidney disease, or end stage renal disease: Secondary | ICD-10-CM | POA: Diagnosis not present

## 2016-04-08 DIAGNOSIS — N186 End stage renal disease: Secondary | ICD-10-CM | POA: Diagnosis not present

## 2016-04-08 DIAGNOSIS — M1991 Primary osteoarthritis, unspecified site: Secondary | ICD-10-CM | POA: Diagnosis not present

## 2016-04-08 DIAGNOSIS — I482 Chronic atrial fibrillation: Secondary | ICD-10-CM | POA: Diagnosis not present

## 2016-04-08 DIAGNOSIS — I5040 Unspecified combined systolic (congestive) and diastolic (congestive) heart failure: Secondary | ICD-10-CM | POA: Diagnosis not present

## 2016-04-08 DIAGNOSIS — Z471 Aftercare following joint replacement surgery: Secondary | ICD-10-CM | POA: Diagnosis not present

## 2016-04-08 LAB — BASIC METABOLIC PANEL
BUN: 38 mg/dL — AB (ref 4–21)
Creatinine: 6.7 mg/dL — AB (ref 0.6–1.3)
Glucose: 105 mg/dL
POTASSIUM: 4.3 mmol/L (ref 3.4–5.3)
Sodium: 137 mmol/L (ref 137–147)

## 2016-04-08 LAB — HEPATIC FUNCTION PANEL
ALT: 8 U/L — AB (ref 10–40)
AST: 27 U/L (ref 14–40)
Alkaline Phosphatase: 208 U/L — AB (ref 25–125)
Bilirubin, Total: 0.4 mg/dL

## 2016-04-08 LAB — CBC AND DIFFERENTIAL
HEMATOCRIT: 31 % — AB (ref 41–53)
Hemoglobin: 9.7 g/dL — AB (ref 13.5–17.5)
NEUTROS ABS: 5 /uL
Platelets: 269 10*3/uL (ref 150–399)
WBC: 6.9 10^3/mL

## 2016-04-09 DIAGNOSIS — D509 Iron deficiency anemia, unspecified: Secondary | ICD-10-CM | POA: Diagnosis not present

## 2016-04-09 DIAGNOSIS — N186 End stage renal disease: Secondary | ICD-10-CM | POA: Diagnosis not present

## 2016-04-09 DIAGNOSIS — D631 Anemia in chronic kidney disease: Secondary | ICD-10-CM | POA: Diagnosis not present

## 2016-04-09 DIAGNOSIS — N2581 Secondary hyperparathyroidism of renal origin: Secondary | ICD-10-CM | POA: Diagnosis not present

## 2016-04-09 DIAGNOSIS — I4891 Unspecified atrial fibrillation: Secondary | ICD-10-CM | POA: Diagnosis not present

## 2016-04-09 DIAGNOSIS — Z23 Encounter for immunization: Secondary | ICD-10-CM | POA: Diagnosis not present

## 2016-04-11 DIAGNOSIS — Z471 Aftercare following joint replacement surgery: Secondary | ICD-10-CM | POA: Diagnosis not present

## 2016-04-11 DIAGNOSIS — N186 End stage renal disease: Secondary | ICD-10-CM | POA: Diagnosis not present

## 2016-04-11 DIAGNOSIS — M1991 Primary osteoarthritis, unspecified site: Secondary | ICD-10-CM | POA: Diagnosis not present

## 2016-04-11 DIAGNOSIS — I482 Chronic atrial fibrillation: Secondary | ICD-10-CM | POA: Diagnosis not present

## 2016-04-11 DIAGNOSIS — I5040 Unspecified combined systolic (congestive) and diastolic (congestive) heart failure: Secondary | ICD-10-CM | POA: Diagnosis not present

## 2016-04-11 DIAGNOSIS — I132 Hypertensive heart and chronic kidney disease with heart failure and with stage 5 chronic kidney disease, or end stage renal disease: Secondary | ICD-10-CM | POA: Diagnosis not present

## 2016-04-12 DIAGNOSIS — I4891 Unspecified atrial fibrillation: Secondary | ICD-10-CM | POA: Diagnosis not present

## 2016-04-12 DIAGNOSIS — D509 Iron deficiency anemia, unspecified: Secondary | ICD-10-CM | POA: Diagnosis not present

## 2016-04-12 DIAGNOSIS — N2581 Secondary hyperparathyroidism of renal origin: Secondary | ICD-10-CM | POA: Diagnosis not present

## 2016-04-12 DIAGNOSIS — N186 End stage renal disease: Secondary | ICD-10-CM | POA: Diagnosis not present

## 2016-04-12 DIAGNOSIS — Z23 Encounter for immunization: Secondary | ICD-10-CM | POA: Diagnosis not present

## 2016-04-12 DIAGNOSIS — D631 Anemia in chronic kidney disease: Secondary | ICD-10-CM | POA: Diagnosis not present

## 2016-04-13 DIAGNOSIS — M1611 Unilateral primary osteoarthritis, right hip: Secondary | ICD-10-CM | POA: Diagnosis not present

## 2016-04-14 DIAGNOSIS — I4891 Unspecified atrial fibrillation: Secondary | ICD-10-CM | POA: Diagnosis not present

## 2016-04-14 DIAGNOSIS — I482 Chronic atrial fibrillation: Secondary | ICD-10-CM | POA: Diagnosis not present

## 2016-04-14 DIAGNOSIS — N2581 Secondary hyperparathyroidism of renal origin: Secondary | ICD-10-CM | POA: Diagnosis not present

## 2016-04-14 DIAGNOSIS — N186 End stage renal disease: Secondary | ICD-10-CM | POA: Diagnosis not present

## 2016-04-14 DIAGNOSIS — D509 Iron deficiency anemia, unspecified: Secondary | ICD-10-CM | POA: Diagnosis not present

## 2016-04-14 DIAGNOSIS — I5040 Unspecified combined systolic (congestive) and diastolic (congestive) heart failure: Secondary | ICD-10-CM | POA: Diagnosis not present

## 2016-04-14 DIAGNOSIS — Z471 Aftercare following joint replacement surgery: Secondary | ICD-10-CM | POA: Diagnosis not present

## 2016-04-14 DIAGNOSIS — I132 Hypertensive heart and chronic kidney disease with heart failure and with stage 5 chronic kidney disease, or end stage renal disease: Secondary | ICD-10-CM | POA: Diagnosis not present

## 2016-04-14 DIAGNOSIS — D631 Anemia in chronic kidney disease: Secondary | ICD-10-CM | POA: Diagnosis not present

## 2016-04-14 DIAGNOSIS — Z23 Encounter for immunization: Secondary | ICD-10-CM | POA: Diagnosis not present

## 2016-04-14 DIAGNOSIS — M1991 Primary osteoarthritis, unspecified site: Secondary | ICD-10-CM | POA: Diagnosis not present

## 2016-04-15 DIAGNOSIS — M1991 Primary osteoarthritis, unspecified site: Secondary | ICD-10-CM | POA: Diagnosis not present

## 2016-04-15 DIAGNOSIS — I482 Chronic atrial fibrillation: Secondary | ICD-10-CM | POA: Diagnosis not present

## 2016-04-15 DIAGNOSIS — N186 End stage renal disease: Secondary | ICD-10-CM | POA: Diagnosis not present

## 2016-04-15 DIAGNOSIS — I132 Hypertensive heart and chronic kidney disease with heart failure and with stage 5 chronic kidney disease, or end stage renal disease: Secondary | ICD-10-CM | POA: Diagnosis not present

## 2016-04-15 DIAGNOSIS — I5040 Unspecified combined systolic (congestive) and diastolic (congestive) heart failure: Secondary | ICD-10-CM | POA: Diagnosis not present

## 2016-04-15 DIAGNOSIS — Z471 Aftercare following joint replacement surgery: Secondary | ICD-10-CM | POA: Diagnosis not present

## 2016-04-15 LAB — PROTIME-INR

## 2016-04-16 DIAGNOSIS — Z992 Dependence on renal dialysis: Secondary | ICD-10-CM | POA: Diagnosis not present

## 2016-04-16 DIAGNOSIS — I158 Other secondary hypertension: Secondary | ICD-10-CM | POA: Diagnosis not present

## 2016-04-16 DIAGNOSIS — N186 End stage renal disease: Secondary | ICD-10-CM | POA: Diagnosis not present

## 2016-04-16 DIAGNOSIS — Z23 Encounter for immunization: Secondary | ICD-10-CM | POA: Diagnosis not present

## 2016-04-16 DIAGNOSIS — D509 Iron deficiency anemia, unspecified: Secondary | ICD-10-CM | POA: Diagnosis not present

## 2016-04-16 DIAGNOSIS — D631 Anemia in chronic kidney disease: Secondary | ICD-10-CM | POA: Diagnosis not present

## 2016-04-16 DIAGNOSIS — N2581 Secondary hyperparathyroidism of renal origin: Secondary | ICD-10-CM | POA: Diagnosis not present

## 2016-04-16 DIAGNOSIS — I4891 Unspecified atrial fibrillation: Secondary | ICD-10-CM | POA: Diagnosis not present

## 2016-04-18 DIAGNOSIS — Z471 Aftercare following joint replacement surgery: Secondary | ICD-10-CM | POA: Diagnosis not present

## 2016-04-18 DIAGNOSIS — I5040 Unspecified combined systolic (congestive) and diastolic (congestive) heart failure: Secondary | ICD-10-CM | POA: Diagnosis not present

## 2016-04-18 DIAGNOSIS — M1991 Primary osteoarthritis, unspecified site: Secondary | ICD-10-CM | POA: Diagnosis not present

## 2016-04-18 DIAGNOSIS — I132 Hypertensive heart and chronic kidney disease with heart failure and with stage 5 chronic kidney disease, or end stage renal disease: Secondary | ICD-10-CM | POA: Diagnosis not present

## 2016-04-18 DIAGNOSIS — N186 End stage renal disease: Secondary | ICD-10-CM | POA: Diagnosis not present

## 2016-04-18 DIAGNOSIS — I482 Chronic atrial fibrillation: Secondary | ICD-10-CM | POA: Diagnosis not present

## 2016-04-19 DIAGNOSIS — D631 Anemia in chronic kidney disease: Secondary | ICD-10-CM | POA: Diagnosis not present

## 2016-04-19 DIAGNOSIS — N2581 Secondary hyperparathyroidism of renal origin: Secondary | ICD-10-CM | POA: Diagnosis not present

## 2016-04-19 DIAGNOSIS — I4891 Unspecified atrial fibrillation: Secondary | ICD-10-CM | POA: Diagnosis not present

## 2016-04-19 DIAGNOSIS — N186 End stage renal disease: Secondary | ICD-10-CM | POA: Diagnosis not present

## 2016-04-19 DIAGNOSIS — D509 Iron deficiency anemia, unspecified: Secondary | ICD-10-CM | POA: Diagnosis not present

## 2016-04-20 DIAGNOSIS — M1991 Primary osteoarthritis, unspecified site: Secondary | ICD-10-CM | POA: Diagnosis not present

## 2016-04-20 DIAGNOSIS — Z471 Aftercare following joint replacement surgery: Secondary | ICD-10-CM | POA: Diagnosis not present

## 2016-04-20 DIAGNOSIS — I5040 Unspecified combined systolic (congestive) and diastolic (congestive) heart failure: Secondary | ICD-10-CM | POA: Diagnosis not present

## 2016-04-20 DIAGNOSIS — I482 Chronic atrial fibrillation: Secondary | ICD-10-CM | POA: Diagnosis not present

## 2016-04-20 DIAGNOSIS — I132 Hypertensive heart and chronic kidney disease with heart failure and with stage 5 chronic kidney disease, or end stage renal disease: Secondary | ICD-10-CM | POA: Diagnosis not present

## 2016-04-20 DIAGNOSIS — N186 End stage renal disease: Secondary | ICD-10-CM | POA: Diagnosis not present

## 2016-04-21 DIAGNOSIS — I4891 Unspecified atrial fibrillation: Secondary | ICD-10-CM | POA: Diagnosis not present

## 2016-04-21 DIAGNOSIS — N2581 Secondary hyperparathyroidism of renal origin: Secondary | ICD-10-CM | POA: Diagnosis not present

## 2016-04-21 DIAGNOSIS — D509 Iron deficiency anemia, unspecified: Secondary | ICD-10-CM | POA: Diagnosis not present

## 2016-04-21 DIAGNOSIS — N186 End stage renal disease: Secondary | ICD-10-CM | POA: Diagnosis not present

## 2016-04-21 DIAGNOSIS — D631 Anemia in chronic kidney disease: Secondary | ICD-10-CM | POA: Diagnosis not present

## 2016-04-23 DIAGNOSIS — I4891 Unspecified atrial fibrillation: Secondary | ICD-10-CM | POA: Diagnosis not present

## 2016-04-23 DIAGNOSIS — D509 Iron deficiency anemia, unspecified: Secondary | ICD-10-CM | POA: Diagnosis not present

## 2016-04-23 DIAGNOSIS — D631 Anemia in chronic kidney disease: Secondary | ICD-10-CM | POA: Diagnosis not present

## 2016-04-23 DIAGNOSIS — N2581 Secondary hyperparathyroidism of renal origin: Secondary | ICD-10-CM | POA: Diagnosis not present

## 2016-04-23 DIAGNOSIS — N186 End stage renal disease: Secondary | ICD-10-CM | POA: Diagnosis not present

## 2016-04-25 ENCOUNTER — Ambulatory Visit (INDEPENDENT_AMBULATORY_CARE_PROVIDER_SITE_OTHER): Payer: Medicare Other | Admitting: *Deleted

## 2016-04-25 DIAGNOSIS — N186 End stage renal disease: Secondary | ICD-10-CM | POA: Diagnosis not present

## 2016-04-25 DIAGNOSIS — I132 Hypertensive heart and chronic kidney disease with heart failure and with stage 5 chronic kidney disease, or end stage renal disease: Secondary | ICD-10-CM | POA: Diagnosis not present

## 2016-04-25 DIAGNOSIS — I5040 Unspecified combined systolic (congestive) and diastolic (congestive) heart failure: Secondary | ICD-10-CM | POA: Diagnosis not present

## 2016-04-25 DIAGNOSIS — I4891 Unspecified atrial fibrillation: Secondary | ICD-10-CM | POA: Diagnosis not present

## 2016-04-25 DIAGNOSIS — M1991 Primary osteoarthritis, unspecified site: Secondary | ICD-10-CM | POA: Diagnosis not present

## 2016-04-25 DIAGNOSIS — Z7901 Long term (current) use of anticoagulants: Secondary | ICD-10-CM

## 2016-04-25 DIAGNOSIS — I482 Chronic atrial fibrillation, unspecified: Secondary | ICD-10-CM

## 2016-04-25 DIAGNOSIS — Z5181 Encounter for therapeutic drug level monitoring: Secondary | ICD-10-CM

## 2016-04-25 DIAGNOSIS — Z471 Aftercare following joint replacement surgery: Secondary | ICD-10-CM | POA: Diagnosis not present

## 2016-04-25 LAB — POCT INR: INR: 2.1

## 2016-04-26 DIAGNOSIS — N186 End stage renal disease: Secondary | ICD-10-CM | POA: Diagnosis not present

## 2016-04-26 DIAGNOSIS — D509 Iron deficiency anemia, unspecified: Secondary | ICD-10-CM | POA: Diagnosis not present

## 2016-04-26 DIAGNOSIS — N2581 Secondary hyperparathyroidism of renal origin: Secondary | ICD-10-CM | POA: Diagnosis not present

## 2016-04-26 DIAGNOSIS — D631 Anemia in chronic kidney disease: Secondary | ICD-10-CM | POA: Diagnosis not present

## 2016-04-26 DIAGNOSIS — I4891 Unspecified atrial fibrillation: Secondary | ICD-10-CM | POA: Diagnosis not present

## 2016-04-28 DIAGNOSIS — I4891 Unspecified atrial fibrillation: Secondary | ICD-10-CM | POA: Diagnosis not present

## 2016-04-28 DIAGNOSIS — N186 End stage renal disease: Secondary | ICD-10-CM | POA: Diagnosis not present

## 2016-04-28 DIAGNOSIS — N2581 Secondary hyperparathyroidism of renal origin: Secondary | ICD-10-CM | POA: Diagnosis not present

## 2016-04-28 DIAGNOSIS — D631 Anemia in chronic kidney disease: Secondary | ICD-10-CM | POA: Diagnosis not present

## 2016-04-28 DIAGNOSIS — D509 Iron deficiency anemia, unspecified: Secondary | ICD-10-CM | POA: Diagnosis not present

## 2016-05-02 DIAGNOSIS — Z471 Aftercare following joint replacement surgery: Secondary | ICD-10-CM | POA: Diagnosis not present

## 2016-05-02 DIAGNOSIS — N186 End stage renal disease: Secondary | ICD-10-CM | POA: Diagnosis not present

## 2016-05-02 DIAGNOSIS — D631 Anemia in chronic kidney disease: Secondary | ICD-10-CM | POA: Diagnosis not present

## 2016-05-02 DIAGNOSIS — N2581 Secondary hyperparathyroidism of renal origin: Secondary | ICD-10-CM | POA: Diagnosis not present

## 2016-05-02 DIAGNOSIS — M1991 Primary osteoarthritis, unspecified site: Secondary | ICD-10-CM | POA: Diagnosis not present

## 2016-05-02 DIAGNOSIS — I132 Hypertensive heart and chronic kidney disease with heart failure and with stage 5 chronic kidney disease, or end stage renal disease: Secondary | ICD-10-CM | POA: Diagnosis not present

## 2016-05-02 DIAGNOSIS — D509 Iron deficiency anemia, unspecified: Secondary | ICD-10-CM | POA: Diagnosis not present

## 2016-05-02 DIAGNOSIS — I5040 Unspecified combined systolic (congestive) and diastolic (congestive) heart failure: Secondary | ICD-10-CM | POA: Diagnosis not present

## 2016-05-02 DIAGNOSIS — I4891 Unspecified atrial fibrillation: Secondary | ICD-10-CM | POA: Diagnosis not present

## 2016-05-02 DIAGNOSIS — I482 Chronic atrial fibrillation: Secondary | ICD-10-CM | POA: Diagnosis not present

## 2016-05-03 DIAGNOSIS — D509 Iron deficiency anemia, unspecified: Secondary | ICD-10-CM | POA: Diagnosis not present

## 2016-05-03 DIAGNOSIS — N2581 Secondary hyperparathyroidism of renal origin: Secondary | ICD-10-CM | POA: Diagnosis not present

## 2016-05-03 DIAGNOSIS — D631 Anemia in chronic kidney disease: Secondary | ICD-10-CM | POA: Diagnosis not present

## 2016-05-03 DIAGNOSIS — N186 End stage renal disease: Secondary | ICD-10-CM | POA: Diagnosis not present

## 2016-05-03 DIAGNOSIS — I4891 Unspecified atrial fibrillation: Secondary | ICD-10-CM | POA: Diagnosis not present

## 2016-05-04 DIAGNOSIS — I5040 Unspecified combined systolic (congestive) and diastolic (congestive) heart failure: Secondary | ICD-10-CM | POA: Diagnosis not present

## 2016-05-04 DIAGNOSIS — M1991 Primary osteoarthritis, unspecified site: Secondary | ICD-10-CM | POA: Diagnosis not present

## 2016-05-04 DIAGNOSIS — I482 Chronic atrial fibrillation: Secondary | ICD-10-CM | POA: Diagnosis not present

## 2016-05-04 DIAGNOSIS — N186 End stage renal disease: Secondary | ICD-10-CM | POA: Diagnosis not present

## 2016-05-04 DIAGNOSIS — Z471 Aftercare following joint replacement surgery: Secondary | ICD-10-CM | POA: Diagnosis not present

## 2016-05-04 DIAGNOSIS — I132 Hypertensive heart and chronic kidney disease with heart failure and with stage 5 chronic kidney disease, or end stage renal disease: Secondary | ICD-10-CM | POA: Diagnosis not present

## 2016-05-05 DIAGNOSIS — I4891 Unspecified atrial fibrillation: Secondary | ICD-10-CM | POA: Diagnosis not present

## 2016-05-05 DIAGNOSIS — N186 End stage renal disease: Secondary | ICD-10-CM | POA: Diagnosis not present

## 2016-05-05 DIAGNOSIS — N2581 Secondary hyperparathyroidism of renal origin: Secondary | ICD-10-CM | POA: Diagnosis not present

## 2016-05-05 DIAGNOSIS — D631 Anemia in chronic kidney disease: Secondary | ICD-10-CM | POA: Diagnosis not present

## 2016-05-05 DIAGNOSIS — D509 Iron deficiency anemia, unspecified: Secondary | ICD-10-CM | POA: Diagnosis not present

## 2016-05-06 ENCOUNTER — Ambulatory Visit (HOSPITAL_COMMUNITY)
Admission: RE | Admit: 2016-05-06 | Discharge: 2016-05-06 | Disposition: A | Discharge status: Home / Self Care | Payer: Medicare Other | Source: Ambulatory Visit | Attending: Internal Medicine | Admitting: Internal Medicine

## 2016-05-06 ENCOUNTER — Encounter (HOSPITAL_COMMUNITY): Payer: Self-pay | Admitting: Internal Medicine

## 2016-05-06 VITALS — BP 110/60 | HR 56 | Wt 201.4 lb

## 2016-05-06 DIAGNOSIS — Z79899 Other long term (current) drug therapy: Secondary | ICD-10-CM | POA: Insufficient documentation

## 2016-05-06 DIAGNOSIS — M25551 Pain in right hip: Secondary | ICD-10-CM | POA: Diagnosis not present

## 2016-05-06 DIAGNOSIS — I5022 Chronic systolic (congestive) heart failure: Secondary | ICD-10-CM | POA: Insufficient documentation

## 2016-05-06 DIAGNOSIS — Z7901 Long term (current) use of anticoagulants: Secondary | ICD-10-CM | POA: Diagnosis not present

## 2016-05-06 DIAGNOSIS — R001 Bradycardia, unspecified: Secondary | ICD-10-CM | POA: Diagnosis not present

## 2016-05-06 DIAGNOSIS — Z992 Dependence on renal dialysis: Secondary | ICD-10-CM | POA: Diagnosis not present

## 2016-05-06 DIAGNOSIS — M109 Gout, unspecified: Secondary | ICD-10-CM | POA: Insufficient documentation

## 2016-05-06 DIAGNOSIS — M069 Rheumatoid arthritis, unspecified: Secondary | ICD-10-CM | POA: Diagnosis not present

## 2016-05-06 DIAGNOSIS — N186 End stage renal disease: Secondary | ICD-10-CM | POA: Diagnosis not present

## 2016-05-06 DIAGNOSIS — I5042 Chronic combined systolic (congestive) and diastolic (congestive) heart failure: Secondary | ICD-10-CM | POA: Diagnosis not present

## 2016-05-06 DIAGNOSIS — M1611 Unilateral primary osteoarthritis, right hip: Secondary | ICD-10-CM

## 2016-05-06 DIAGNOSIS — I10 Essential (primary) hypertension: Secondary | ICD-10-CM | POA: Diagnosis not present

## 2016-05-06 DIAGNOSIS — I132 Hypertensive heart and chronic kidney disease with heart failure and with stage 5 chronic kidney disease, or end stage renal disease: Secondary | ICD-10-CM | POA: Diagnosis not present

## 2016-05-06 DIAGNOSIS — G4733 Obstructive sleep apnea (adult) (pediatric): Secondary | ICD-10-CM | POA: Insufficient documentation

## 2016-05-06 DIAGNOSIS — I482 Chronic atrial fibrillation, unspecified: Secondary | ICD-10-CM

## 2016-05-06 DIAGNOSIS — M169 Osteoarthritis of hip, unspecified: Secondary | ICD-10-CM | POA: Diagnosis not present

## 2016-05-06 DIAGNOSIS — Z7951 Long term (current) use of inhaled steroids: Secondary | ICD-10-CM | POA: Insufficient documentation

## 2016-05-06 NOTE — Progress Notes (Signed)
Patient ID: Bruce Cole, male   DOB: April 28, 1941, 75 y.o.   MRN: [id number]    Advanced Heart Failure Clinic Note   Primary HF: Dr. Haroldine Laws  Nephrology: Dr [name]  HPI: Bruce Cole is a 75 y.o.  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 He has been on chronic coumadin therapy.   R/LHC 02/2013 RA = 28 with prominent v-waves  RV = 67/21/28  PA = 70/23 (42)  PCW = 29  Fick cardiac output/index = 7.5/2.8  PVR = 1.8 [place]  SVR = 610  FA sat = 96%  PA sat = 64%, 70%  No RV LV interaction  Near equalization of RV, LV and RA diastolic pressures ** Essentially normal coronary arteries**  Echo 03/2013 EF 45-50% with grade II diastolic dysfunction, moderate to severe RV dilation with mild systolic dysfunction, PA systolic pressure 60 mmHg.   Admitted 1/30-2/04/2016 with volume overload.  Creatinine was noted to have been gradually increasing over the past several months prior to admission, and was as high as 3.92 (day of discharge) With BUN in range of 90-100. He had left AVF placed 08/26/15. Dialysis not initiated during that admission. Discharge weight 281 lbs.  Admitted 11/10/15-11/25/15 with worsening dyspnea and noted AKI. He had fistulogram on 5/4 which showed his previously placed AVF was not usable and had repeat AV fistula placed 11/23/15.  Dialysis initiated during this admission via temporary cath. Discharge weight 228 lbs (Down from > 270 on admission)  He presents today for regular follow up.  Now s/p R hip replacement 03/22/16. Down 26 lbs since June and feeling great. Getting around without pain. Feels more balanced. No SOB getting around and has been able to increase activity.  Does a stationary bike for 30 minutes, no DOE with that.  No orthopnea, lightheadedness, dizziness.  Tolerating dialysis on Tue/Thurs/Saturday.  He is off coreg completely, was only tolerating 1/2 3.125 mg tablet, so discontinued  in July.  Now taking furosemide on non-dialysis days.   Echo 01/22/16 LVEF 35-40%.   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  Past Medical History:  Diagnosis Date  . Acute blood loss anemia   . Atrial fibrillation (HCC)    Chronic  . Chronic combined systolic and diastolic heart failure ([place])   . Colon polyp 2000  . Dysrhythmia    hx  . ESRD on hemodialysis [facility])    Started HD April 2017, ESRD d/t cardiorenal syndrome  . Essential hypertension   .  Gastritis and gastroduodenitis   . GI bleed   . Gout   . Heart murmur   . Nephrolithiasis   . OSA (obstructive sleep apnea) 09/02/2013    IMPRESSION :  1. Mild obstructive sleep apnea with hypopneas causing sleep fragmentation and moderate oxygen desaturation.  2. Short runs of nonsustained VT were noted. His beta blocker may need to be titrated 3. Significant PLM's were noted, the PLM arousal index was low. Please correlate with clinical history of restless leg syndrome.  4. Sleep efficiency was poor.  RECOMMENDATION:  1. Treatment options for this degree of sleep disordered breathing include weight loss and positional therapy to avoid supine sleep 2. Consider titrating beta blocker further, defer to cardiologist 3. Patient should be cautioned against driving when sleepy.They should be asked to avoid medications with sedative side effects     . Osteoarthritis of right hip   . Pneumonia     hx 30 yrs ago  . Primary osteoarthritis of right hip   . Rheumatoid arthritis ([place])   . Thrombocytopenia ([place])     Current Outpatient Prescriptions  Medication Sig Dispense Refill  . acetaminophen (TYLENOL) 500 MG tablet Take 500 mg by mouth every 6 (six) hours as needed for mild pain.    [name] albuterol (PROVENTIL) (2.5 MG/3ML) 0.083% nebulizer solution Take 2.5 mg by nebulization every 4 (four) hours as needed for shortness of breath. Reported on 09/25/2015    . allopurinol (ZYLOPRIM) 100 MG tablet Take 2 tablets (200 mg total) by mouth daily. 30 tablet 0  . B Complex-C-Folic Acid (RENA-VITE RX) 1 MG TABS Take 1 tablet by mouth daily.  6  . fluticasone (FLONASE) 50 MCG/ACT nasal spray Place 2 sprays into both nostrils daily. 16 g 6  . furosemide (LASIX) 80 MG tablet     . HYDROcodone-acetaminophen (NORCO/VICODIN) 5-325 MG tablet     . hydroxychloroquine (PLAQUENIL) 200 MG tablet Take 200 mg by mouth 2 (two) times daily. Reported on 01/13/2016    . pantoprazole (PROTONIX) 40 MG tablet Take 1 tablet (40 mg total) by mouth 2 (two) times daily. 60 tablet 0  . polyethylene glycol (MIRALAX / GLYCOLAX) packet Take 17 g by mouth 2 (two) times daily.    [name] RENVELA 800 MG tablet Take 1 tablet by mouth 3 (three) times daily with meals.  0  . sennosides-docusate sodium (SENOKOT-S) 8.6-50 MG tablet Take 2 tablets by mouth 2 (two) times daily.    [name] warfarin (COUMADIN) 7.5 MG tablet Take 7.5 mg by mouth. Mon-Tue-Wed-Fri-Sat-Sun     No current facility-administered medications for this encounter.    PHYSICAL EXAM: Vitals:   05/06/16 1140  BP: 110/60  BP Location: Right Arm  Patient Position: Sitting  Cuff Size: Normal  Pulse: (!) 56  SpO2: 100%  Weight: 201 lb 6.4 oz (91.4 kg)   Wt Readings from Last 3 Encounters:  05/06/16 201 lb 6.4 oz (91.4 kg)  04/06/16 227 lb (103 kg)  03/29/16 227 lb (103 kg)     General:  Well appearing, NAD. In [place].  HEENT: normal Neck: supple. JVP not elevated.    Carotids 2+ bilaterally; no bruits. No thyromegaly or nodule noted.  Cor: PMI normal. Bradycardic and irregular chronically. No gallops/rubs. 2/6 early SEM RUSB Lungs: CTAB, normal effort Abdomen: soft, NT, ND, no HSM. No bruits or masses. +BS  Extremities: no cyanosis, clubbing, rash,  Trace - 1+ edema at most. L forearm AV with thrill.  Neuro: alert & orientedx3, cranial nerves  grossly intact. Moves all 4 extremities w/o difficulty. Affect pleasant.  ASSESSMENT & PLAN: 1) Chronic systolic HF with prominent RV failure: Echo 01/2016 EF 35-40%,  2017 EF 40-45%, moderately LVH, diffuse hypokinesis, moderate AS, Mod LAE, Severe RAE, mod/sev TR, PA peak pressure 59 mm Hg.  - RHC/LHC in 8/14 with no CAD, suggestive of restrictive physiology, and pulmonary venous hypertension (low PVR).  - NYHA II symptoms on dialysis.  - Fluid status per HD. Tues, Thursday, Saturdays. Dry weight is now down to 201 lbs.   2) OSA: Unable to tolerate CPAP previously. F/u sleep study was improved.  3) Atrial fibrillation: Chronic, rate controlled. Continue coumadin.  - Have previously discussed NOAC and he is not interested - Bradycardia is chronic and asymptomatic 4) ESRD now on dialysis.  - Follows with Dr [name].  - Continues to improve. Now on lasix on non-dialysis days. Dosing per Renal.  5) R Hip pain - Now s/p R Hip replacement from Dr. Rush Farmer - Feeling much better. Increased activity and has lost a good deal of weight.   Mr Alter looks great.  No changes from HF perspective.  Follow up 6 months. Can be seen sooner with any symptoms.    Satira Mccallum Tillery PA-C 05/06/2016

## 2016-05-06 NOTE — Patient Instructions (Signed)
Your physician recommends that you schedule a follow-up appointment in: 6 months with Dr Bensimhon  

## 2016-05-07 DIAGNOSIS — D509 Iron deficiency anemia, unspecified: Secondary | ICD-10-CM | POA: Diagnosis not present

## 2016-05-07 DIAGNOSIS — N2581 Secondary hyperparathyroidism of renal origin: Secondary | ICD-10-CM | POA: Diagnosis not present

## 2016-05-07 DIAGNOSIS — D631 Anemia in chronic kidney disease: Secondary | ICD-10-CM | POA: Diagnosis not present

## 2016-05-07 DIAGNOSIS — I4891 Unspecified atrial fibrillation: Secondary | ICD-10-CM | POA: Diagnosis not present

## 2016-05-07 DIAGNOSIS — N186 End stage renal disease: Secondary | ICD-10-CM | POA: Diagnosis not present

## 2016-05-10 DIAGNOSIS — D631 Anemia in chronic kidney disease: Secondary | ICD-10-CM | POA: Diagnosis not present

## 2016-05-10 DIAGNOSIS — I4891 Unspecified atrial fibrillation: Secondary | ICD-10-CM | POA: Diagnosis not present

## 2016-05-10 DIAGNOSIS — N2581 Secondary hyperparathyroidism of renal origin: Secondary | ICD-10-CM | POA: Diagnosis not present

## 2016-05-10 DIAGNOSIS — D509 Iron deficiency anemia, unspecified: Secondary | ICD-10-CM | POA: Diagnosis not present

## 2016-05-10 DIAGNOSIS — N186 End stage renal disease: Secondary | ICD-10-CM | POA: Diagnosis not present

## 2016-05-11 ENCOUNTER — Ambulatory Visit (INDEPENDENT_AMBULATORY_CARE_PROVIDER_SITE_OTHER): Payer: Medicare Other | Admitting: Orthopaedic Surgery

## 2016-05-11 ENCOUNTER — Encounter (INDEPENDENT_AMBULATORY_CARE_PROVIDER_SITE_OTHER): Payer: Self-pay | Admitting: Orthopaedic Surgery

## 2016-05-11 DIAGNOSIS — Z96641 Presence of right artificial hip joint: Secondary | ICD-10-CM

## 2016-05-11 MED ORDER — GABAPENTIN 300 MG PO CAPS: 300.0000 mg | ORAL_CAPSULE | Freq: Every day | ORAL | 1 refills | Status: DC

## 2016-05-11 MED ORDER — HYDROCODONE-ACETAMINOPHEN 5-325 MG PO TABS: 1.0000 | ORAL_TABLET | Freq: Four times a day (QID) | ORAL | 0 refills | Status: DC | PRN

## 2016-05-11 NOTE — Progress Notes (Addendum)
Office Visit Note   Patient: Bruce Cole           Date of Birth: 12/13/1940           MRN: [id number] Visit Date: 05/11/2016              Requested by: Debbrah Alar, NP [address] [place], [place] [address] PCP: Nance Pear., NP   Assessment & Plan: Visit Diagnoses:  1. Presence of right artificial hip joint     Plan:Follow up for wound check and to evaluate neuropathic pain right hip   Follow-Up Instructions: Return in about 4 weeks (around 06/08/2016) for post op.   Orders:  No orders of the defined types were placed in this encounter.  Meds ordered this encounter  Medications  . gabapentin (NEURONTIN) 300 MG capsule    Sig: Take 1 capsule (300 mg total) by mouth at bedtime.    Dispense:  30 capsule    Refill:  1  . HYDROcodone-acetaminophen (NORCO) 5-325 MG tablet    Sig: Take 1 tablet by mouth every 6 (six) hours as needed for moderate pain. One to two tabs every 4-6 hours for pain    Dispense:  40 tablet    Refill:  0      Procedures: No procedures performed   Clinical Data: No additional findings.   Subjective: Chief Complaint  Patient presents with  . Right Hip - Routine Post Op    Rt THA 03/22/16 Patient ambulates with cane, doing much better ROM and strength are getting better States he has a burning nerve type pain when he lies down at night. Wondering if he can have a medication for this?    Status post right total hip arthroplasty. States doing well just some burning right hip at night. No longer has pre op hip pain. Needs refill on Norco.     Review of Systems   Objective: Vital Signs: There were no vitals taken for this visit.  Physical Exam  Right Hip Exam   Tenderness  The patient is experiencing no tenderness.     Range of Motion  Internal Rotation: 20  External Rotation: 40   Comments:  Hip incision benign some mild swelling due to small seroma.Calf supple non  tender      Specialty Comments:  No specialty comments available.  Imaging: No results found.   PMFS History: Patient Active Problem List   Diagnosis Date Noted  . Osteoarthritis of right hip 03/22/2016  . Status post total replacement of right hip 03/22/2016  . Pre-operative cardiovascular examination 01/19/2016  . ESRD on dialysis ([place]) 11/27/2015  . Debility 11/27/2015  . Primary osteoarthritis of right hip   . S/P dialysis catheter insertion ([place])   . Gastritis and gastroduodenitis   . S/P thoracentesis   . Encounter for therapeutic drug monitoring 09/02/2013  . OSA (obstructive sleep apnea) 09/02/2013  . Chronic combined systolic and diastolic heart failure ([place]) 03/16/2013  . Allergic rhinitis 12/18/2012  . Long term (current) use of anticoagulants 11/25/2010  . Genital herpes 05/31/2010  . DM (diabetes mellitus), type 2 with renal complications ([place]) 67/34/1937  . ERECTILE DYSFUNCTION, ORGANIC 09/21/2009  . Osteoarthritis 09/21/2009  . PERSONAL HX COLONIC POLYPS 08/25/2009  . Gout 07/22/2009  . Essential hypertension 07/22/2009  . ATRIAL FIBRILLATION 07/22/2009  . Rheumatoid arthritis ([place]) 07/22/2009  . NEPHROLITHIASIS, HX OF 07/22/2009   Past Medical History:  Diagnosis Date  . Acute blood loss anemia   .  Atrial fibrillation (HCC)    Chronic  . Chronic combined systolic and diastolic heart failure ([place])   . Colon polyp 2000  . Dysrhythmia    hx  . ESRD on hemodialysis [facility])    Started HD April 2017, ESRD d/t cardiorenal syndrome  . Essential hypertension   . Gastritis and gastroduodenitis   . GI bleed   . Gout   . Heart murmur   . Nephrolithiasis   . OSA (obstructive sleep apnea) 09/02/2013    IMPRESSION :  1. Mild obstructive sleep apnea with hypopneas causing sleep fragmentation and moderate oxygen desaturation.  2. Short runs of nonsustained VT were noted. His beta blocker may need to be titrated 3. Significant PLM's were noted, the PLM arousal  index was low. Please correlate with clinical history of restless leg syndrome.  4. Sleep efficiency was poor.  RECOMMENDATION:  1. Treatment options for this degree of sleep disordered breathing include weight loss and positional therapy to avoid supine sleep 2. Consider titrating beta blocker further, defer to cardiologist 3. Patient should be cautioned against driving when sleepy.They should be asked to avoid medications with sedative side effects     . Osteoarthritis of right hip   . Pneumonia    hx 30 yrs ago  . Primary osteoarthritis of right hip   . Rheumatoid arthritis ([place])   . Thrombocytopenia ([place])     Family History  Problem Relation Age of Onset  . Hypertension Mother   . Arthritis Mother     ?RA  . Hypertension Father     Past Surgical History:  Procedure Laterality Date  . AV FISTULA PLACEMENT Left 08/26/2015   Procedure: LEFT RADIOCEPHALIC FISTULA CREATION;  Surgeon: Rosetta Posner, MD;  Location: [place];  Service: Vascular;  Laterality: Left;  . AV FISTULA PLACEMENT Left 11/23/2015   Procedure: ARTERIOVENOUS (AV) FISTULA CREATION;  Surgeon: Rosetta Posner, MD;  Location: [place];  Service: Vascular;  Laterality: Left;  . CHOLECYSTECTOMY  1994  . CYSTOSCOPY/RETROGRADE/URETEROSCOPY/STONE EXTRACTION WITH BASKET    . ESOPHAGOGASTRODUODENOSCOPY N/A 11/13/2015   Procedure: ESOPHAGOGASTRODUODENOSCOPY (EGD);  Surgeon: Irene Shipper, MD;  Location[facility] ENDOSCOPY;  Service: Endoscopy;  Laterality: N/A;  . INSERTION OF DIALYSIS CATHETER Left 11/23/2015   Procedure: INSERTION OF DIALYSIS CATHETER;  Surgeon: Rosetta Posner, MD;  Location: [place];  Service: Vascular;  Laterality: Left;  . JOINT REPLACEMENT     Total L-Hip replacement, Right Knee 10/20/09  . LEFT AND RIGHT HEART CATHETERIZATION WITH CORONARY ANGIOGRAM N/A 02/22/2013   Procedure: LEFT AND RIGHT HEART CATHETERIZATION WITH CORONARY ANGIOGRAM;  Surgeon: Jolaine Artist, MD;  Location: [facility] CATH LAB;  Service: Cardiovascular;  Laterality:  N/A;  . LITHOTRIPSY  90's  . PERIPHERAL VASCULAR CATHETERIZATION Left 11/19/2015   Procedure: A/V/Fistulagram;  Surgeon: Conrad Bourneville, MD;  Location: [place] CV LAB;  Service: Cardiovascular;  Laterality: Left;  . SPINE SURGERY     x 2  . TOTAL HIP ARTHROPLASTY Right 03/22/2016   Procedure: RIGHT TOTAL HIP ARTHROPLASTY ANTERIOR APPROACH;  Surgeon: Mcarthur Rossetti, MD;  Location: [place];  Service: Orthopedics;  Laterality: Right;   Social History   Occupational History  . retired Programmer, multimedia  Retired   Social History Main Topics  . Smoking status: Never Smoker  . Smokeless tobacco: Never Used  . Alcohol use Yes     Comment: occasional  . Drug use: No  . Sexual activity: Not on file

## 2016-05-11 NOTE — Addendum Note (Signed)
Addended by: Erskine Emery on: 05/11/2016 10:32 AM   Modules accepted: Orders

## 2016-05-12 DIAGNOSIS — N186 End stage renal disease: Secondary | ICD-10-CM | POA: Diagnosis not present

## 2016-05-12 DIAGNOSIS — D631 Anemia in chronic kidney disease: Secondary | ICD-10-CM | POA: Diagnosis not present

## 2016-05-12 DIAGNOSIS — I4891 Unspecified atrial fibrillation: Secondary | ICD-10-CM | POA: Diagnosis not present

## 2016-05-12 DIAGNOSIS — D509 Iron deficiency anemia, unspecified: Secondary | ICD-10-CM | POA: Diagnosis not present

## 2016-05-12 DIAGNOSIS — N2581 Secondary hyperparathyroidism of renal origin: Secondary | ICD-10-CM | POA: Diagnosis not present

## 2016-05-13 LAB — PROTIME-INR

## 2016-05-14 DIAGNOSIS — N2581 Secondary hyperparathyroidism of renal origin: Secondary | ICD-10-CM | POA: Diagnosis not present

## 2016-05-14 DIAGNOSIS — N186 End stage renal disease: Secondary | ICD-10-CM | POA: Diagnosis not present

## 2016-05-14 DIAGNOSIS — D509 Iron deficiency anemia, unspecified: Secondary | ICD-10-CM | POA: Diagnosis not present

## 2016-05-14 DIAGNOSIS — D631 Anemia in chronic kidney disease: Secondary | ICD-10-CM | POA: Diagnosis not present

## 2016-05-14 DIAGNOSIS — I4891 Unspecified atrial fibrillation: Secondary | ICD-10-CM | POA: Diagnosis not present

## 2016-05-16 ENCOUNTER — Ambulatory Visit (INDEPENDENT_AMBULATORY_CARE_PROVIDER_SITE_OTHER): Payer: Medicare Other

## 2016-05-16 ENCOUNTER — Other Ambulatory Visit: Payer: Self-pay | Admitting: Adult Health

## 2016-05-16 DIAGNOSIS — Z5181 Encounter for therapeutic drug level monitoring: Secondary | ICD-10-CM

## 2016-05-16 DIAGNOSIS — I4891 Unspecified atrial fibrillation: Secondary | ICD-10-CM

## 2016-05-16 DIAGNOSIS — Z7901 Long term (current) use of anticoagulants: Secondary | ICD-10-CM | POA: Diagnosis not present

## 2016-05-16 LAB — POCT INR: INR: 2.2

## 2016-05-17 DIAGNOSIS — D509 Iron deficiency anemia, unspecified: Secondary | ICD-10-CM | POA: Diagnosis not present

## 2016-05-17 DIAGNOSIS — Z992 Dependence on renal dialysis: Secondary | ICD-10-CM | POA: Diagnosis not present

## 2016-05-17 DIAGNOSIS — I158 Other secondary hypertension: Secondary | ICD-10-CM | POA: Diagnosis not present

## 2016-05-17 DIAGNOSIS — N2581 Secondary hyperparathyroidism of renal origin: Secondary | ICD-10-CM | POA: Diagnosis not present

## 2016-05-17 DIAGNOSIS — I4891 Unspecified atrial fibrillation: Secondary | ICD-10-CM | POA: Diagnosis not present

## 2016-05-17 DIAGNOSIS — D631 Anemia in chronic kidney disease: Secondary | ICD-10-CM | POA: Diagnosis not present

## 2016-05-17 DIAGNOSIS — N186 End stage renal disease: Secondary | ICD-10-CM | POA: Diagnosis not present

## 2016-05-19 ENCOUNTER — Other Ambulatory Visit: Payer: Self-pay

## 2016-05-19 DIAGNOSIS — Z23 Encounter for immunization: Secondary | ICD-10-CM | POA: Diagnosis not present

## 2016-05-19 DIAGNOSIS — D509 Iron deficiency anemia, unspecified: Secondary | ICD-10-CM | POA: Diagnosis not present

## 2016-05-19 DIAGNOSIS — D631 Anemia in chronic kidney disease: Secondary | ICD-10-CM | POA: Diagnosis not present

## 2016-05-19 DIAGNOSIS — I871 Compression of vein: Secondary | ICD-10-CM | POA: Diagnosis not present

## 2016-05-19 DIAGNOSIS — I4891 Unspecified atrial fibrillation: Secondary | ICD-10-CM | POA: Diagnosis not present

## 2016-05-19 DIAGNOSIS — T82858A Stenosis of vascular prosthetic devices, implants and grafts, initial encounter: Secondary | ICD-10-CM | POA: Diagnosis not present

## 2016-05-19 DIAGNOSIS — I771 Stricture of artery: Secondary | ICD-10-CM | POA: Diagnosis not present

## 2016-05-19 DIAGNOSIS — Z992 Dependence on renal dialysis: Secondary | ICD-10-CM | POA: Diagnosis not present

## 2016-05-19 DIAGNOSIS — N186 End stage renal disease: Secondary | ICD-10-CM | POA: Diagnosis not present

## 2016-05-19 DIAGNOSIS — E8770 Fluid overload, unspecified: Secondary | ICD-10-CM | POA: Diagnosis not present

## 2016-05-19 DIAGNOSIS — N2581 Secondary hyperparathyroidism of renal origin: Secondary | ICD-10-CM | POA: Diagnosis not present

## 2016-05-20 ENCOUNTER — Telehealth: Payer: Self-pay

## 2016-05-20 DIAGNOSIS — N2581 Secondary hyperparathyroidism of renal origin: Secondary | ICD-10-CM | POA: Diagnosis not present

## 2016-05-20 DIAGNOSIS — D509 Iron deficiency anemia, unspecified: Secondary | ICD-10-CM | POA: Diagnosis not present

## 2016-05-20 DIAGNOSIS — D631 Anemia in chronic kidney disease: Secondary | ICD-10-CM | POA: Diagnosis not present

## 2016-05-20 DIAGNOSIS — Z23 Encounter for immunization: Secondary | ICD-10-CM | POA: Diagnosis not present

## 2016-05-20 DIAGNOSIS — N186 End stage renal disease: Secondary | ICD-10-CM | POA: Diagnosis not present

## 2016-05-20 DIAGNOSIS — I4891 Unspecified atrial fibrillation: Secondary | ICD-10-CM | POA: Diagnosis not present

## 2016-05-20 NOTE — Telephone Encounter (Signed)
-----   Message from Debbrah Alar, NP sent at 05/20/2016  7:05 AM EDT ----- Regarding: RE: coumadin being held pre-op No bridge. Thank you.  Debbrah Alar NP ----- Message ----- From: Denman George, RN Sent: 05/19/2016   5:38 PM To: Debbrah Alar, NP Subject: coumadin being held pre-op                     This pt. is scheduled for Revision of left arm Basilic Vein Transposition vs. New Left arm AVG; he has been instructed to hold his Coumadin 5 day prior to surgery.  His last dose to be taken is 05/24/16.  Will he need bridged with Lovenox?  If so, can this be prescribed by the Coumadin Clinic?

## 2016-05-21 DIAGNOSIS — I4891 Unspecified atrial fibrillation: Secondary | ICD-10-CM | POA: Diagnosis not present

## 2016-05-21 DIAGNOSIS — D509 Iron deficiency anemia, unspecified: Secondary | ICD-10-CM | POA: Diagnosis not present

## 2016-05-21 DIAGNOSIS — Z23 Encounter for immunization: Secondary | ICD-10-CM | POA: Diagnosis not present

## 2016-05-21 DIAGNOSIS — N2581 Secondary hyperparathyroidism of renal origin: Secondary | ICD-10-CM | POA: Diagnosis not present

## 2016-05-21 DIAGNOSIS — D631 Anemia in chronic kidney disease: Secondary | ICD-10-CM | POA: Diagnosis not present

## 2016-05-21 DIAGNOSIS — N186 End stage renal disease: Secondary | ICD-10-CM | POA: Diagnosis not present

## 2016-05-24 DIAGNOSIS — D631 Anemia in chronic kidney disease: Secondary | ICD-10-CM | POA: Diagnosis not present

## 2016-05-24 DIAGNOSIS — Z23 Encounter for immunization: Secondary | ICD-10-CM | POA: Diagnosis not present

## 2016-05-24 DIAGNOSIS — I4891 Unspecified atrial fibrillation: Secondary | ICD-10-CM | POA: Diagnosis not present

## 2016-05-24 DIAGNOSIS — N186 End stage renal disease: Secondary | ICD-10-CM | POA: Diagnosis not present

## 2016-05-24 DIAGNOSIS — N2581 Secondary hyperparathyroidism of renal origin: Secondary | ICD-10-CM | POA: Diagnosis not present

## 2016-05-24 DIAGNOSIS — D509 Iron deficiency anemia, unspecified: Secondary | ICD-10-CM | POA: Diagnosis not present

## 2016-05-25 ENCOUNTER — Encounter: Payer: Self-pay | Admitting: Vascular Surgery

## 2016-05-25 ENCOUNTER — Ambulatory Visit (INDEPENDENT_AMBULATORY_CARE_PROVIDER_SITE_OTHER): Payer: Medicare Other | Admitting: Vascular Surgery

## 2016-05-25 VITALS — BP 90/56 | HR 60 | Temp 97.4°F | Ht 75.0 in | Wt 240.0 lb

## 2016-05-25 DIAGNOSIS — N186 End stage renal disease: Secondary | ICD-10-CM

## 2016-05-25 DIAGNOSIS — Z992 Dependence on renal dialysis: Secondary | ICD-10-CM | POA: Diagnosis not present

## 2016-05-25 NOTE — Progress Notes (Signed)
Patient name: Bruce Cole MRN: [id number] DOB: 10/18/1940 Sex: male  REASON FOR VISIT: To evaluate his fistula.  HPI: Bruce Cole is a 75 y.o. male who presents to have his fistula evaluated. He was last seen in our office by Dr. Sherren Mocha Early on 12/29/2015. At that time he just had a left IJ hemodialysis catheter placed and had a left basilic vein transposition performed. His surgery was on 11/23/2015. At the time of his visit in June the fistula was good size and was maturing nicely with an excellent thrill.  He was partly having poor flow in his fistula and therefore underwent a fistulogram which I have reviewed. There was some narrowing adjacent to the anastomosis and this was ballooned with moderate success. I spoke with Dr. Augustin Coupe on the phone and we felt that the best option would probably be surgical revision of the arterial anastomosis as this was really the only problem identified with the fistula. The vein itself was widely patent and there was no central venous stenosis.  Of note, he just had a right total hip replacement by Dr. Jean Rosenthal.   Past Medical History:  Diagnosis Date  . Acute blood loss anemia   . Atrial fibrillation (HCC)    Chronic  . Chronic combined systolic and diastolic heart failure ([place])   . Colon polyp 2000  . Dysrhythmia    hx  . ESRD on hemodialysis [facility])    Started HD April 2017, ESRD d/t cardiorenal syndrome  . Essential hypertension   . Gastritis and gastroduodenitis   . GI bleed   . Gout   . Heart murmur   . Nephrolithiasis   . OSA (obstructive sleep apnea) 09/02/2013    IMPRESSION :  1. Mild obstructive sleep apnea with hypopneas causing sleep fragmentation and moderate oxygen desaturation.  2. Short runs of nonsustained VT were noted. His beta blocker may need to be titrated 3. Significant PLM's were noted, the PLM arousal index was low. Please correlate with clinical history of restless leg syndrome.  4. Sleep efficiency was  poor.  RECOMMENDATION:  1. Treatment options for this degree of sleep disordered breathing include weight loss and positional therapy to avoid supine sleep 2. Consider titrating beta blocker further, defer to cardiologist 3. Patient should be cautioned against driving when sleepy.They should be asked to avoid medications with sedative side effects     . Osteoarthritis of right hip   . Pneumonia    hx 30 yrs ago  . Primary osteoarthritis of right hip   . Rheumatoid arthritis ([place])   . Thrombocytopenia ([place])     Family History  Problem Relation Age of Onset  . Hypertension Mother   . Arthritis Mother     ?RA  . Hypertension Father     SOCIAL HISTORY: Social History  Substance Use Topics  . Smoking status: Never Smoker  . Smokeless tobacco: Never Used  . Alcohol use Yes     Comment: occasional    Allergies  Allergen Reactions  . Ace Inhibitors Other (See Comments)    Worsening renal insufficiency    Current Outpatient Prescriptions  Medication Sig Dispense Refill  . acetaminophen (TYLENOL) 500 MG tablet Take 500 mg by mouth every 6 (six) hours as needed for mild pain.    [name] albuterol (PROVENTIL) (2.5 MG/3ML) 0.083% nebulizer solution Take 2.5 mg by nebulization every 4 (four) hours as needed for shortness of breath. Reported on 09/25/2015    . allopurinol (ZYLOPRIM) 100 MG  tablet Take 2 tablets (200 mg total) by mouth daily. 30 tablet 0  . B Complex-C-Folic Acid (RENA-VITE RX) 1 MG TABS Take 1 tablet by mouth daily.  6  . fluticasone (FLONASE) 50 MCG/ACT nasal spray Place 2 sprays into both nostrils daily. 16 g 6  . furosemide (LASIX) 80 MG tablet     . gabapentin (NEURONTIN) 300 MG capsule Take 1 capsule (300 mg total) by mouth at bedtime. 30 capsule 1  . HYDROcodone-acetaminophen (NORCO) 5-325 MG tablet Take 1 tablet by mouth every 6 (six) hours as needed for moderate pain. One to two tabs every 4-6 hours for pain 40 tablet 0  . hydroxychloroquine (PLAQUENIL) 200 MG tablet  Take 200 mg by mouth 2 (two) times daily. Reported on 01/13/2016    . pantoprazole (PROTONIX) 40 MG tablet Take 1 tablet (40 mg total) by mouth 2 (two) times daily. 60 tablet 0  . polyethylene glycol (MIRALAX / GLYCOLAX) packet Take 17 g by mouth 2 (two) times daily.    [name] RENVELA 800 MG tablet Take 1 tablet by mouth 3 (three) times daily with meals.  0  . sennosides-docusate sodium (SENOKOT-S) 8.6-50 MG tablet Take 2 tablets by mouth 2 (two) times daily.    [name] warfarin (COUMADIN) 7.5 MG tablet Take 7.5 mg by mouth. Mon-Tue-Wed-Fri-Sat-Sun     No current facility-administered medications for this visit.     REVIEW OF SYSTEMS:  [X]  denotes positive finding, [ ]  denotes negative finding Cardiac  Comments:  Chest pain or chest pressure:    Shortness of breath upon exertion:    Short of breath when lying flat:    Irregular heart rhythm:        Vascular    Pain in calf, thigh, or hip brought on by ambulation:    Pain in feet at night that wakes you up from your sleep:     Blood clot in your veins:    Leg swelling:         Pulmonary    Oxygen at home:    Productive cough:     Wheezing:         Neurologic    Sudden weakness in arms or legs:     Sudden numbness in arms or legs:     Sudden onset of difficulty speaking or slurred speech:    Temporary loss of vision in one eye:     Problems with dizziness:         Gastrointestinal    Blood in stool:     Vomited blood:         Genitourinary    Burning when urinating:     Blood in urine:        Psychiatric    Major depression:         Hematologic    Bleeding problems:    Problems with blood clotting too easily:        Skin    Rashes or ulcers:        Constitutional    Fever or chills:      PHYSICAL EXAM: Vitals:   05/25/16 1035  BP: (!) 90/56  Pulse: 60  Temp: 97.4 F (36.3 C)  TempSrc: Oral  Weight: 240 lb (108.9 kg)  Height: 6\' 3"  (1.905 m)    GENERAL: The patient is a well-nourished male, in no acute distress.  The vital signs are documented above. CARDIAC: There is a regular rate and rhythm.  VASCULAR: He has a weak  thrill in his left silicone vein transposition. He has a palpable left radial pulse. PULMONARY: There is good air exchange bilaterally without wheezing or rales. SKIN: There are no ulcers or rashes noted. PSYCHIATRIC: The patient has a normal affect.  DATA:   I have reviewed his fistulogram that was performed last Thursday. There appeared to be some narrowing at the arterial anastomosis although it was difficult to see. The vein itself was widely patent and there was no central venous stenosis.  MEDICAL ISSUES:  POORLY FUNCTIONING LEFT BASILIC VEIN TRANSPOSITION: Given that the fistula is not functioning adequately, and has a very weak thrill, I agree that there is an inflow problem. We have discussed the options of surgical revision versus potentially a cutting balloon which was another option discussed with the patient by Dr. Augustin Coupe. I would favor surgical revision of the arterial anastomosis and this is scheduled for Monday with Dr. Bridgett Larsson. The patient is on Coumadin and this is already on hold in preparation for surgery Monday. He is very frustrated about multiple attempts at access and I have explained that unfortunately this is not uncommon to have these challenges.    Deitra Mayo [facility] [phone]

## 2016-05-26 DIAGNOSIS — D509 Iron deficiency anemia, unspecified: Secondary | ICD-10-CM | POA: Diagnosis not present

## 2016-05-26 DIAGNOSIS — I4891 Unspecified atrial fibrillation: Secondary | ICD-10-CM | POA: Diagnosis not present

## 2016-05-26 DIAGNOSIS — D631 Anemia in chronic kidney disease: Secondary | ICD-10-CM | POA: Diagnosis not present

## 2016-05-26 DIAGNOSIS — N186 End stage renal disease: Secondary | ICD-10-CM | POA: Diagnosis not present

## 2016-05-26 DIAGNOSIS — N2581 Secondary hyperparathyroidism of renal origin: Secondary | ICD-10-CM | POA: Diagnosis not present

## 2016-05-26 DIAGNOSIS — Z23 Encounter for immunization: Secondary | ICD-10-CM | POA: Diagnosis not present

## 2016-05-27 ENCOUNTER — Encounter (HOSPITAL_COMMUNITY): Payer: Self-pay | Admitting: *Deleted

## 2016-05-27 DIAGNOSIS — N186 End stage renal disease: Secondary | ICD-10-CM | POA: Diagnosis not present

## 2016-05-27 DIAGNOSIS — E8770 Fluid overload, unspecified: Secondary | ICD-10-CM | POA: Diagnosis not present

## 2016-05-28 DIAGNOSIS — N2581 Secondary hyperparathyroidism of renal origin: Secondary | ICD-10-CM | POA: Diagnosis not present

## 2016-05-28 DIAGNOSIS — Z23 Encounter for immunization: Secondary | ICD-10-CM | POA: Diagnosis not present

## 2016-05-28 DIAGNOSIS — I4891 Unspecified atrial fibrillation: Secondary | ICD-10-CM | POA: Diagnosis not present

## 2016-05-28 DIAGNOSIS — N186 End stage renal disease: Secondary | ICD-10-CM | POA: Diagnosis not present

## 2016-05-28 DIAGNOSIS — D509 Iron deficiency anemia, unspecified: Secondary | ICD-10-CM | POA: Diagnosis not present

## 2016-05-28 DIAGNOSIS — D631 Anemia in chronic kidney disease: Secondary | ICD-10-CM | POA: Diagnosis not present

## 2016-05-30 ENCOUNTER — Encounter (HOSPITAL_COMMUNITY): Payer: Self-pay | Admitting: *Deleted

## 2016-05-30 ENCOUNTER — Ambulatory Visit (HOSPITAL_COMMUNITY)
Admission: RE | Admit: 2016-05-30 | Discharge: 2016-05-30 | Disposition: A | Discharge status: Home / Self Care | Payer: Medicare Other | Source: Ambulatory Visit | Attending: Vascular Surgery | Admitting: Vascular Surgery

## 2016-05-30 ENCOUNTER — Encounter (HOSPITAL_COMMUNITY)
Admission: RE | Disposition: A | Discharge status: Home / Self Care | Payer: Self-pay | Source: Ambulatory Visit | Attending: Vascular Surgery

## 2016-05-30 ENCOUNTER — Ambulatory Visit (HOSPITAL_COMMUNITY): Payer: Medicare Other | Admitting: Certified Registered"

## 2016-05-30 DIAGNOSIS — I120 Hypertensive chronic kidney disease with stage 5 chronic kidney disease or end stage renal disease: Secondary | ICD-10-CM | POA: Diagnosis not present

## 2016-05-30 DIAGNOSIS — Y828 Other medical devices associated with adverse incidents: Secondary | ICD-10-CM | POA: Diagnosis not present

## 2016-05-30 DIAGNOSIS — Z96641 Presence of right artificial hip joint: Secondary | ICD-10-CM | POA: Insufficient documentation

## 2016-05-30 DIAGNOSIS — M1611 Unilateral primary osteoarthritis, right hip: Secondary | ICD-10-CM | POA: Diagnosis not present

## 2016-05-30 DIAGNOSIS — Z888 Allergy status to other drugs, medicaments and biological substances status: Secondary | ICD-10-CM | POA: Diagnosis not present

## 2016-05-30 DIAGNOSIS — Z992 Dependence on renal dialysis: Secondary | ICD-10-CM | POA: Insufficient documentation

## 2016-05-30 DIAGNOSIS — R011 Cardiac murmur, unspecified: Secondary | ICD-10-CM | POA: Diagnosis not present

## 2016-05-30 DIAGNOSIS — N186 End stage renal disease: Secondary | ICD-10-CM | POA: Diagnosis not present

## 2016-05-30 DIAGNOSIS — M069 Rheumatoid arthritis, unspecified: Secondary | ICD-10-CM | POA: Diagnosis not present

## 2016-05-30 DIAGNOSIS — T82858A Stenosis of vascular prosthetic devices, implants and grafts, initial encounter: Secondary | ICD-10-CM | POA: Diagnosis not present

## 2016-05-30 DIAGNOSIS — G4733 Obstructive sleep apnea (adult) (pediatric): Secondary | ICD-10-CM | POA: Diagnosis not present

## 2016-05-30 DIAGNOSIS — M109 Gout, unspecified: Secondary | ICD-10-CM | POA: Diagnosis not present

## 2016-05-30 DIAGNOSIS — J449 Chronic obstructive pulmonary disease, unspecified: Secondary | ICD-10-CM | POA: Diagnosis not present

## 2016-05-30 DIAGNOSIS — I482 Chronic atrial fibrillation: Secondary | ICD-10-CM | POA: Diagnosis not present

## 2016-05-30 DIAGNOSIS — G2581 Restless legs syndrome: Secondary | ICD-10-CM | POA: Diagnosis not present

## 2016-05-30 DIAGNOSIS — T82898A Other specified complication of vascular prosthetic devices, implants and grafts, initial encounter: Secondary | ICD-10-CM | POA: Diagnosis not present

## 2016-05-30 DIAGNOSIS — Z7901 Long term (current) use of anticoagulants: Secondary | ICD-10-CM | POA: Diagnosis not present

## 2016-05-30 DIAGNOSIS — K219 Gastro-esophageal reflux disease without esophagitis: Secondary | ICD-10-CM | POA: Diagnosis not present

## 2016-05-30 DIAGNOSIS — E1122 Type 2 diabetes mellitus with diabetic chronic kidney disease: Secondary | ICD-10-CM | POA: Diagnosis not present

## 2016-05-30 DIAGNOSIS — I132 Hypertensive heart and chronic kidney disease with heart failure and with stage 5 chronic kidney disease, or end stage renal disease: Secondary | ICD-10-CM | POA: Insufficient documentation

## 2016-05-30 DIAGNOSIS — T827XXA Infection and inflammatory reaction due to other cardiac and vascular devices, implants and grafts, initial encounter: Secondary | ICD-10-CM | POA: Diagnosis not present

## 2016-05-30 DIAGNOSIS — I5042 Chronic combined systolic (congestive) and diastolic (congestive) heart failure: Secondary | ICD-10-CM | POA: Insufficient documentation

## 2016-05-30 HISTORY — DX: Personal history of urinary calculi: Z87.442

## 2016-05-30 HISTORY — PX: REVISON OF ARTERIOVENOUS FISTULA: SHX6074

## 2016-05-30 HISTORY — DX: Personal history of other medical treatment: Z92.89

## 2016-05-30 LAB — POCT I-STAT 4, (NA,K, GLUC, HGB,HCT)
GLUCOSE: 97 mg/dL (ref 65–99)
HCT: 31 % — ABNORMAL LOW (ref 39.0–52.0)
Hemoglobin: 10.5 g/dL — ABNORMAL LOW (ref 13.0–17.0)
Potassium: 5.4 mmol/L — ABNORMAL HIGH (ref 3.5–5.1)
Sodium: 134 mmol/L — ABNORMAL LOW (ref 135–145)

## 2016-05-30 LAB — APTT: aPTT: 35 seconds (ref 24–36)

## 2016-05-30 LAB — PROTIME-INR
INR: 1.09
PROTHROMBIN TIME: 14.1 s (ref 11.4–15.2)

## 2016-05-30 SURGERY — REVISON OF ARTERIOVENOUS FISTULA
Anesthesia: General | Site: Arm Upper | Laterality: Left

## 2016-05-30 MED ORDER — MIDAZOLAM HCL 2 MG/2ML IJ SOLN
INTRAMUSCULAR | Status: AC
  Filled 2016-05-30: qty 2

## 2016-05-30 MED ORDER — ONDANSETRON HCL 4 MG/2ML IJ SOLN
INTRAMUSCULAR | Status: DC | PRN
  Administered 2016-05-30: 4 mg via INTRAVENOUS

## 2016-05-30 MED ORDER — FENTANYL CITRATE (PF) 100 MCG/2ML IJ SOLN
25.0000 ug | INTRAMUSCULAR | Status: DC | PRN
  Administered 2016-05-30: 50 ug via INTRAVENOUS
  Administered 2016-05-30: 25 ug via INTRAVENOUS

## 2016-05-30 MED ORDER — 0.9 % SODIUM CHLORIDE (POUR BTL) OPTIME
TOPICAL | Status: DC | PRN
  Administered 2016-05-30: 1000 mL

## 2016-05-30 MED ORDER — MIDAZOLAM HCL 2 MG/2ML IJ SOLN: 0.5000 mg | Freq: Once | INTRAMUSCULAR | Status: DC | PRN

## 2016-05-30 MED ORDER — FENTANYL CITRATE (PF) 250 MCG/5ML IJ SOLN
INTRAMUSCULAR | Status: AC
  Filled 2016-05-30: qty 5

## 2016-05-30 MED ORDER — DEXTROSE 5 % IV SOLN
INTRAVENOUS | Status: AC
  Filled 2016-05-30: qty 1.5

## 2016-05-30 MED ORDER — LABETALOL HCL 5 MG/ML IV SOLN
INTRAVENOUS | Status: AC
  Filled 2016-05-30: qty 4

## 2016-05-30 MED ORDER — DEXTROSE 5 % IV SOLN
1.5000 g | INTRAVENOUS | Status: AC
  Administered 2016-05-30: 1.5 g via INTRAVENOUS

## 2016-05-30 MED ORDER — HEPARIN SODIUM (PORCINE) 5000 UNIT/ML IJ SOLN
INTRAMUSCULAR | Status: DC | PRN
  Administered 2016-05-30: 12:00:00

## 2016-05-30 MED ORDER — PROPOFOL 10 MG/ML IV BOLUS
INTRAVENOUS | Status: DC | PRN
  Administered 2016-05-30: 150 mg via INTRAVENOUS

## 2016-05-30 MED ORDER — SUGAMMADEX SODIUM 200 MG/2ML IV SOLN
INTRAVENOUS | Status: AC
  Filled 2016-05-30: qty 2

## 2016-05-30 MED ORDER — LIDOCAINE HCL (CARDIAC) 20 MG/ML IV SOLN
INTRAVENOUS | Status: DC | PRN
  Administered 2016-05-30: 20 mg via INTRATRACHEAL

## 2016-05-30 MED ORDER — SODIUM CHLORIDE 0.9 % IV SOLN
INTRAVENOUS | Status: DC
  Administered 2016-05-30 (×2): via INTRAVENOUS

## 2016-05-30 MED ORDER — FENTANYL CITRATE (PF) 250 MCG/5ML IJ SOLN
INTRAMUSCULAR | Status: DC | PRN
  Administered 2016-05-30: 50 ug via INTRAVENOUS
  Administered 2016-05-30: 25 ug via INTRAVENOUS
  Administered 2016-05-30 (×2): 50 ug via INTRAVENOUS
  Administered 2016-05-30: 25 ug via INTRAVENOUS
  Administered 2016-05-30: 50 ug via INTRAVENOUS

## 2016-05-30 MED ORDER — PROPOFOL 10 MG/ML IV BOLUS
INTRAVENOUS | Status: AC
  Filled 2016-05-30: qty 20

## 2016-05-30 MED ORDER — ONDANSETRON HCL 4 MG/2ML IJ SOLN
INTRAMUSCULAR | Status: AC
Start: 2016-05-30 — End: 2016-05-30
  Filled 2016-05-30: qty 2

## 2016-05-30 MED ORDER — ROCURONIUM BROMIDE 10 MG/ML (PF) SYRINGE
PREFILLED_SYRINGE | INTRAVENOUS | Status: AC
Start: 2016-05-30 — End: 2016-05-30
  Filled 2016-05-30: qty 10

## 2016-05-30 MED ORDER — MIDAZOLAM HCL 2 MG/2ML IJ SOLN
INTRAMUSCULAR | Status: DC | PRN
  Administered 2016-05-30: 1 mg via INTRAVENOUS

## 2016-05-30 MED ORDER — PHENYLEPHRINE HCL 10 MG/ML IJ SOLN
INTRAMUSCULAR | Status: DC | PRN
  Administered 2016-05-30 (×3): 120 ug via INTRAVENOUS

## 2016-05-30 MED ORDER — CHLORHEXIDINE GLUCONATE CLOTH 2 % EX PADS: 6.0000 | MEDICATED_PAD | Freq: Once | CUTANEOUS | Status: DC

## 2016-05-30 MED ORDER — LIDOCAINE 2% (20 MG/ML) 5 ML SYRINGE
INTRAMUSCULAR | Status: AC
Start: 2016-05-30 — End: 2016-05-30
  Filled 2016-05-30: qty 5

## 2016-05-30 MED ORDER — HYDROCODONE-ACETAMINOPHEN 5-325 MG PO TABS
1.0000 | ORAL_TABLET | Freq: Once | ORAL | Status: AC
  Administered 2016-05-30: 1 via ORAL

## 2016-05-30 MED ORDER — PHENYLEPHRINE HCL 10 MG/ML IJ SOLN
INTRAMUSCULAR | Status: DC | PRN
  Administered 2016-05-30: 30 ug/min via INTRAVENOUS

## 2016-05-30 MED ORDER — MEPERIDINE HCL 25 MG/ML IJ SOLN: 6.2500 mg | INTRAMUSCULAR | Status: DC | PRN

## 2016-05-30 MED ORDER — HEPARIN SODIUM (PORCINE) 1000 UNIT/ML IJ SOLN
INTRAMUSCULAR | Status: AC
  Filled 2016-05-30: qty 2

## 2016-05-30 MED ORDER — HYDROCODONE-ACETAMINOPHEN 5-325 MG PO TABS: 1.0000 | ORAL_TABLET | Freq: Four times a day (QID) | ORAL | 0 refills | Status: DC | PRN

## 2016-05-30 MED ORDER — SODIUM CHLORIDE 0.9 % IV SOLN: INTRAVENOUS | Status: DC

## 2016-05-30 MED ORDER — LIDOCAINE 2% (20 MG/ML) 5 ML SYRINGE
INTRAMUSCULAR | Status: AC
  Filled 2016-05-30: qty 5

## 2016-05-30 MED ORDER — FENTANYL CITRATE (PF) 100 MCG/2ML IJ SOLN
INTRAMUSCULAR | Status: AC
  Filled 2016-05-30: qty 2

## 2016-05-30 MED ORDER — HYDROCODONE-ACETAMINOPHEN 7.5-325 MG/15ML PO SOLN: 10.0000 mL | Freq: Once | ORAL | Status: DC

## 2016-05-30 MED ORDER — PHENYLEPHRINE 40 MCG/ML (10ML) SYRINGE FOR IV PUSH (FOR BLOOD PRESSURE SUPPORT)
PREFILLED_SYRINGE | INTRAVENOUS | Status: AC
Start: 2016-05-30 — End: 2016-05-30
  Filled 2016-05-30: qty 20

## 2016-05-30 MED ORDER — PROMETHAZINE HCL 25 MG/ML IJ SOLN: 6.2500 mg | INTRAMUSCULAR | Status: DC | PRN

## 2016-05-30 MED ORDER — ONDANSETRON HCL 4 MG/2ML IJ SOLN
INTRAMUSCULAR | Status: AC
  Filled 2016-05-30: qty 2

## 2016-05-30 MED ORDER — HYDROCODONE-ACETAMINOPHEN 5-325 MG PO TABS
ORAL_TABLET | ORAL | Status: AC
  Filled 2016-05-30: qty 1

## 2016-05-30 MED ORDER — LIDOCAINE HCL (PF) 1 % IJ SOLN
INTRAMUSCULAR | Status: AC
  Filled 2016-05-30: qty 30

## 2016-05-30 SURGICAL SUPPLY — 37 items
ARMBAND PINK RESTRICT EXTREMIT (MISCELLANEOUS) ×3 IMPLANT
CANISTER SUCTION 2500CC (MISCELLANEOUS) ×3 IMPLANT
CLIP TI MEDIUM 6 (CLIP) ×3 IMPLANT
CLIP TI WIDE RED SMALL 6 (CLIP) ×3 IMPLANT
COVER PROBE W GEL 5X96 (DRAPES) ×3 IMPLANT
DECANTER SPIKE VIAL GLASS SM (MISCELLANEOUS) ×3 IMPLANT
DERMABOND ADVANCED (GAUZE/BANDAGES/DRESSINGS) ×2
DERMABOND ADVANCED .7 DNX12 (GAUZE/BANDAGES/DRESSINGS) ×1 IMPLANT
DRAIN PENROSE 1/2X12 LTX STRL (WOUND CARE) IMPLANT
ELECT REM PT RETURN 9FT ADLT (ELECTROSURGICAL) ×3
ELECTRODE REM PT RTRN 9FT ADLT (ELECTROSURGICAL) ×1 IMPLANT
GLOVE BIO SURGEON STRL SZ7 (GLOVE) ×3 IMPLANT
GLOVE BIOGEL PI IND STRL 7.0 (GLOVE) ×1 IMPLANT
GLOVE BIOGEL PI IND STRL 7.5 (GLOVE) ×2 IMPLANT
GLOVE BIOGEL PI IND STRL 8 (GLOVE) ×1 IMPLANT
GLOVE BIOGEL PI INDICATOR 7.0 (GLOVE) ×2
GLOVE BIOGEL PI INDICATOR 7.5 (GLOVE) ×4
GLOVE BIOGEL PI INDICATOR 8 (GLOVE) ×2
GLOVE ECLIPSE 7.0 STRL STRAW (GLOVE) ×3 IMPLANT
GLOVE ECLIPSE 7.5 STRL STRAW (GLOVE) ×3 IMPLANT
GOWN STRL REUS W/ TWL LRG LVL3 (GOWN DISPOSABLE) ×2 IMPLANT
GOWN STRL REUS W/ TWL XL LVL3 (GOWN DISPOSABLE) ×1 IMPLANT
GOWN STRL REUS W/TWL LRG LVL3 (GOWN DISPOSABLE) ×4
GOWN STRL REUS W/TWL XL LVL3 (GOWN DISPOSABLE) ×2
HEMOSTAT SPONGE AVITENE ULTRA (HEMOSTASIS) IMPLANT
KIT BASIN OR (CUSTOM PROCEDURE TRAY) ×3 IMPLANT
KIT ROOM TURNOVER OR (KITS) ×3 IMPLANT
NS IRRIG 1000ML POUR BTL (IV SOLUTION) ×3 IMPLANT
PACK CV ACCESS (CUSTOM PROCEDURE TRAY) ×3 IMPLANT
PAD ARMBOARD 7.5X6 YLW CONV (MISCELLANEOUS) ×6 IMPLANT
SUT MNCRL AB 4-0 PS2 18 (SUTURE) ×3 IMPLANT
SUT PROLENE 6 0 BV (SUTURE) ×6 IMPLANT
SUT PROLENE 7 0 BV 1 (SUTURE) IMPLANT
SUT VIC AB 3-0 SH 27 (SUTURE) ×2
SUT VIC AB 3-0 SH 27X BRD (SUTURE) ×1 IMPLANT
UNDERPAD 30X30 (UNDERPADS AND DIAPERS) ×3 IMPLANT
WATER STERILE IRR 1000ML POUR (IV SOLUTION) ×3 IMPLANT

## 2016-05-30 NOTE — Anesthesia Preprocedure Evaluation (Addendum)
Anesthesia Evaluation  Patient identified by MRN, date of birth, ID band Patient awake    Reviewed: Allergy & Precautions, NPO status , Patient's Chart, lab work & pertinent test results  History of Anesthesia Complications Negative for: history of anesthetic complications  Airway Mallampati: II  TM Distance: >3 FB Neck ROM: Full    Dental  (+) Chipped, Dental Advisory Given   Pulmonary sleep apnea (does not need CPAP) , COPD,  COPD inhaler,    breath sounds clear to auscultation       Cardiovascular hypertension, Pt. on medications + dysrhythmias Atrial Fibrillation  Rhythm:Regular Rate:Normal  7/17 ECHO:  EF 35% to 40%. Diffuse hypokinesis. Left ventricular diastolic function parameters were normal. - Aortic valve: moderate stenosis. Valve area (VTI): 1.17   cm^2, mod-severe TR   Neuro/Psych negative neurological ROS     GI/Hepatic Neg liver ROS, GERD  Medicated and Controlled,  Endo/Other  negative endocrine ROS  Renal/GU ESRF and DialysisRenal disease (TuThSa, K+ 5.4)     Musculoskeletal  (+) Arthritis , Rheumatoid disorders,    Abdominal   Peds  Hematology  (+) Blood dyscrasia (coumadin), ,   Anesthesia Other Findings   Reproductive/Obstetrics                            Anesthesia Physical Anesthesia Plan  ASA: III  Anesthesia Plan: General   Post-op Pain Management:    Induction: Intravenous  Airway Management Planned: LMA  Additional Equipment:   Intra-op Plan:   Post-operative Plan:   Informed Consent: I have reviewed the patients History and Physical, chart, labs and discussed the procedure including the risks, benefits and alternatives for the proposed anesthesia with the patient or authorized representative who has indicated his/her understanding and acceptance.   Dental advisory given  Plan Discussed with: CRNA and Surgeon  Anesthesia Plan Comments: (Plan  routine monitors, GA- LMA OK)        Anesthesia Quick Evaluation

## 2016-05-30 NOTE — Progress Notes (Addendum)
Patient reports having cane, glasses, $5, cell phone, some credit cards, and Renvela. Patient refused to have this sent to security/ pharmacy. Patient notified [facility] could not be responsible if we do not secure items in the appropriate place. Patient verbalized understanding.

## 2016-05-30 NOTE — Anesthesia Postprocedure Evaluation (Signed)
Anesthesia Post Note  Patient: Bruce Cole  Procedure(s) Performed: Procedure(s) (LRB): REVISION LEFT UPPER ARM FISTULA (Left)  Patient location during evaluation: PACU Anesthesia Type: General Level of consciousness: awake and alert, oriented and patient cooperative Pain management: pain level controlled Vital Signs Assessment: post-procedure vital signs reviewed and stable Respiratory status: spontaneous breathing, nonlabored ventilation and respiratory function stable Cardiovascular status: blood pressure returned to baseline and stable Postop Assessment: no signs of nausea or vomiting Anesthetic complications: no    Last Vitals:  Vitals:   05/30/16 1450 05/30/16 1500  BP: 119/67 119/67  Pulse: (!) 106 (!) 55  Resp: 11 18  Temp:      Last Pain:  Vitals:   05/30/16 1500  TempSrc:   PainSc: 3                  Bruce Cole,Bruce Cole

## 2016-05-30 NOTE — Anesthesia Procedure Notes (Signed)
Procedure Name: LMA Insertion Date/Time: 05/30/2016 12:10 PM Performed by: Lance Coon Pre-anesthesia Checklist: Patient identified, Emergency Drugs available, Suction available, Patient being monitored and Timeout performed Patient Re-evaluated:Patient Re-evaluated prior to inductionOxygen Delivery Method: Circle system utilized Preoxygenation: Pre-oxygenation with 100% oxygen Intubation Type: IV induction LMA: LMA inserted LMA Size: 5.0 Number of attempts: 1 Placement Confirmation: positive ETCO2 and breath sounds checked- equal and bilateral Tube secured with: Tape Dental Injury: Teeth and Oropharynx as per pre-operative assessment

## 2016-05-30 NOTE — Transfer of Care (Signed)
Immediate Anesthesia Transfer of Care Note  Patient: Bruce Cole  Procedure(s) Performed: Procedure(s): REVISION LEFT UPPER ARM FISTULA (Left)  Patient Location: PACU  Anesthesia Type:General  Level of Consciousness: awake and patient cooperative  Airway & Oxygen Therapy: Patient Spontanous Breathing  Post-op Assessment: Report given to RN and Post -op Vital signs reviewed and stable  Post vital signs: Reviewed and stable  Last Vitals:  Vitals:   05/30/16 1349 05/30/16 1350  BP:  128/75  Pulse: (!) 58   Resp: 15 17  Temp: 36.9 C     Last Pain:  Vitals:   05/30/16 0913  TempSrc: Oral      Patients Stated Pain Goal: 5 (56/38/93 7342)  Complications: No apparent anesthesia complications

## 2016-05-30 NOTE — Op Note (Signed)
    OPERATIVE NOTE   PROCEDURE: Revision of left upper arm fistula with revision of anastomosis  PRE-OPERATIVE DIAGNOSIS: possible distal fistula stenosis  POST-OPERATIVE DIAGNOSIS: same as above   SURGEON: Adele Barthel, MD  ASSISTANT(S): Gerri Lins, PAC   ANESTHESIA: general  ESTIMATED BLOOD LOSS: 30 cc  FINDING(S): 1.  Neointimal hyperplasia in distal fistula 2.  Improved palpable thrill at end of the case 3.  Palpable radial pulse at end of the case  SPECIMEN(S):  none  INDICATIONS:   Bruce Cole is a 75 y.o. male who presents with left upper arm fistula where perianastomotic stenosis on fistulogram.  Dr. Scot Dock recommended: revision of left upper arm fistula. Risk, benefits, and alternatives to access surgery were discussed.  The patient is aware the risks include but are not limited to: bleeding, infection, steal syndrome, nerve damage, ischemic monomelic neuropathy, failure to mature, need for additional procedures, death and stroke.  The patient agrees to proceed forward with the procedure.   DESCRIPTION: After obtaining full informed written consent, the patient was brought back to the operating room and placed supine upon the operating table.  The patient received IV antibiotics prior to induction.  After obtaining adequate anesthesia, the patient was prepped and draped in the standard fashion for: left arm access.  I made a longitudinal incision over the distal fistula.  I dissected out the distal fistula.  There was essentially no thrill in this distal fistula.    I then dissected the brachial artery proximal to the prior anastomosis.  There was extensive inflammation surrounding the brachial artery.  I placed vessel loops around the brachial artery which appeared to be 4-4.5 mm in diameter externally.  I further dissected out the fistula and then clamped the fistula.  I transected the fistula proximal to the clamp and then tied off the distal fistula, leaving a  segment of vein as a vein patch on the brachial artery.  I interrogated the distal fistula, there was extensive neointimal hyperplasia, so I transected the diseased segment of fistula.  I spatulated the distal end of the fistula.  I placed the brachial artery under tension proximally and distally.  An arteriotomy was made with a 11-blade and extended with a Potts scissor.  The vein was sewn to the brachial artery with a running stitch of 6-0 Prolene in an end-to-side configuration.  Prior to completing this anastomosis, I backbled each end of the artery and the fistula.  No thrombus was noted from all vessel.  The lumen was washed out with heparinized saline.  I completed this anastomosis in the usual fashion.  A couple repair stitches were placed with 6-0 Prolene to repair pleats in the anastomotic line.  I released all vessel loops and clamps.  Immediately there was an easily palpable thrill with a palpable radial pulse.  The incision was washed out.  The subcutaneous tissue was closed with a a running stitch of 3-0 Vicryl.  The skin was reapproximated with a running subcuticular stitch of 4-0 Monocryl.  The skin was cleaned, dried, and reinforced with Dermabond.   COMPLICATIONS: none  CONDITION: stable   Adele Barthel, MD [facility] Office: [phone] Pager: [phone]  05/30/2016, 1:33 PM

## 2016-05-30 NOTE — Interval H&P Note (Signed)
History and Physical Interval Note:  05/30/2016 11:00 AM  Bruce Cole  has presented today for surgery, with the diagnosis of Complication of left Arm Basilic Vein Transposition T82.858  The various methods of treatment have been discussed with the patient and family. After consideration of risks, benefits and other options for treatment, the patient has consented to  Procedure(s): REVISON OF ARTERIAL ANASTOMOSIS LEFT ARM BASILIC VEIN TRANSPOSITION VERSUS INSERTION NEW LEFT ARM ARTERIOVENOUS GORTEX GRAFT (Left) as a surgical intervention .  The patient's history has been reviewed, patient examined, no change in status, stable for surgery.  I have reviewed the patient's chart and labs.  Questions were answered to the patient's satisfaction.     Adele Barthel

## 2016-05-30 NOTE — H&P (View-Only) (Signed)
Patient name: Bruce Cole MRN: [id number] DOB: [birth year] Sex: male  REASON FOR VISIT: To evaluate his fistula.  HPI: Bruce Cole is a 75 y.o. male who presents to have his fistula evaluated. He was last seen in our office by Dr. Sherren Mocha Early on 12/29/2015. At that time he just had a left IJ hemodialysis catheter placed and had a left basilic vein transposition performed. His surgery was on 11/23/2015. At the time of his visit in June the fistula was good size and was maturing nicely with an excellent thrill.  He was partly having poor flow in his fistula and therefore underwent a fistulogram which I have reviewed. There was some narrowing adjacent to the anastomosis and this was ballooned with moderate success. I spoke with Dr. Augustin Coupe on the phone and we felt that the best option would probably be surgical revision of the arterial anastomosis as this was really the only problem identified with the fistula. The vein itself was widely patent and there was no central venous stenosis.  Of note, he just had a right total hip replacement by Dr. Jean Rosenthal.   Past Medical History:  Diagnosis Date  . Acute blood loss anemia   . Atrial fibrillation (HCC)    Chronic  . Chronic combined systolic and diastolic heart failure ([place])   . Colon polyp 2000  . Dysrhythmia    hx  . ESRD on hemodialysis [facility])    Started HD April 2017, ESRD d/t cardiorenal syndrome  . Essential hypertension   . Gastritis and gastroduodenitis   . GI bleed   . Gout   . Heart murmur   . Nephrolithiasis   . OSA (obstructive sleep apnea) 09/02/2013    IMPRESSION :  1. Mild obstructive sleep apnea with hypopneas causing sleep fragmentation and moderate oxygen desaturation.  2. Short runs of nonsustained VT were noted. His beta blocker may need to be titrated 3. Significant PLM's were noted, the PLM arousal index was low. Please correlate with clinical history of restless leg syndrome.  4. Sleep efficiency was  poor.  RECOMMENDATION:  1. Treatment options for this degree of sleep disordered breathing include weight loss and positional therapy to avoid supine sleep 2. Consider titrating beta blocker further, defer to cardiologist 3. Patient should be cautioned against driving when sleepy.They should be asked to avoid medications with sedative side effects     . Osteoarthritis of right hip   . Pneumonia    hx 30 yrs ago  . Primary osteoarthritis of right hip   . Rheumatoid arthritis ([place])   . Thrombocytopenia ([place])     Family History  Problem Relation Age of Onset  . Hypertension Mother   . Arthritis Mother     ?RA  . Hypertension Father     SOCIAL HISTORY: Social History  Substance Use Topics  . Smoking status: Never Smoker  . Smokeless tobacco: Never Used  . Alcohol use Yes     Comment: occasional    Allergies  Allergen Reactions  . Ace Inhibitors Other (See Comments)    Worsening renal insufficiency    Current Outpatient Prescriptions  Medication Sig Dispense Refill  . acetaminophen (TYLENOL) 500 MG tablet Take 500 mg by mouth every 6 (six) hours as needed for mild pain.    [name] albuterol (PROVENTIL) (2.5 MG/3ML) 0.083% nebulizer solution Take 2.5 mg by nebulization every 4 (four) hours as needed for shortness of breath. Reported on 09/25/2015    . allopurinol (ZYLOPRIM) 100 MG  tablet Take 2 tablets (200 mg total) by mouth daily. 30 tablet 0  . B Complex-C-Folic Acid (RENA-VITE RX) 1 MG TABS Take 1 tablet by mouth daily.  6  . fluticasone (FLONASE) 50 MCG/ACT nasal spray Place 2 sprays into both nostrils daily. 16 g 6  . furosemide (LASIX) 80 MG tablet     . gabapentin (NEURONTIN) 300 MG capsule Take 1 capsule (300 mg total) by mouth at bedtime. 30 capsule 1  . HYDROcodone-acetaminophen (NORCO) 5-325 MG tablet Take 1 tablet by mouth every 6 (six) hours as needed for moderate pain. One to two tabs every 4-6 hours for pain 40 tablet 0  . hydroxychloroquine (PLAQUENIL) 200 MG tablet  Take 200 mg by mouth 2 (two) times daily. Reported on 01/13/2016    . pantoprazole (PROTONIX) 40 MG tablet Take 1 tablet (40 mg total) by mouth 2 (two) times daily. 60 tablet 0  . polyethylene glycol (MIRALAX / GLYCOLAX) packet Take 17 g by mouth 2 (two) times daily.    [name] RENVELA 800 MG tablet Take 1 tablet by mouth 3 (three) times daily with meals.  0  . sennosides-docusate sodium (SENOKOT-S) 8.6-50 MG tablet Take 2 tablets by mouth 2 (two) times daily.    [name] warfarin (COUMADIN) 7.5 MG tablet Take 7.5 mg by mouth. Mon-Tue-Wed-Fri-Sat-Sun     No current facility-administered medications for this visit.     REVIEW OF SYSTEMS:  [X]  denotes positive finding, [ ]  denotes negative finding Cardiac  Comments:  Chest pain or chest pressure:    Shortness of breath upon exertion:    Short of breath when lying flat:    Irregular heart rhythm:        Vascular    Pain in calf, thigh, or hip brought on by ambulation:    Pain in feet at night that wakes you up from your sleep:     Blood clot in your veins:    Leg swelling:         Pulmonary    Oxygen at home:    Productive cough:     Wheezing:         Neurologic    Sudden weakness in arms or legs:     Sudden numbness in arms or legs:     Sudden onset of difficulty speaking or slurred speech:    Temporary loss of vision in one eye:     Problems with dizziness:         Gastrointestinal    Blood in stool:     Vomited blood:         Genitourinary    Burning when urinating:     Blood in urine:        Psychiatric    Major depression:         Hematologic    Bleeding problems:    Problems with blood clotting too easily:        Skin    Rashes or ulcers:        Constitutional    Fever or chills:      PHYSICAL EXAM: Vitals:   05/25/16 1035  BP: (!) 90/56  Pulse: 60  Temp: 97.4 F (36.3 C)  TempSrc: Oral  Weight: 240 lb (108.9 kg)  Height: 6\' 3"  (1.905 m)    GENERAL: The patient is a well-nourished male, in no acute distress.  The vital signs are documented above. CARDIAC: There is a regular rate and rhythm.  VASCULAR: He has a weak  thrill in his left silicone vein transposition. He has a palpable left radial pulse. PULMONARY: There is good air exchange bilaterally without wheezing or rales. SKIN: There are no ulcers or rashes noted. PSYCHIATRIC: The patient has a normal affect.  DATA:   I have reviewed his fistulogram that was performed last Thursday. There appeared to be some narrowing at the arterial anastomosis although it was difficult to see. The vein itself was widely patent and there was no central venous stenosis.  MEDICAL ISSUES:  POORLY FUNCTIONING LEFT BASILIC VEIN TRANSPOSITION: Given that the fistula is not functioning adequately, and has a very weak thrill, I agree that there is an inflow problem. We have discussed the options of surgical revision versus potentially a cutting balloon which was another option discussed with the patient by Dr. Augustin Coupe. I would favor surgical revision of the arterial anastomosis and this is scheduled for Monday with Dr. Bridgett Larsson. The patient is on Coumadin and this is already on hold in preparation for surgery Monday. He is very frustrated about multiple attempts at access and I have explained that unfortunately this is not uncommon to have these challenges.    Deitra Mayo [facility] [phone]

## 2016-05-31 ENCOUNTER — Encounter (HOSPITAL_COMMUNITY): Payer: Self-pay | Admitting: Vascular Surgery

## 2016-05-31 DIAGNOSIS — D631 Anemia in chronic kidney disease: Secondary | ICD-10-CM | POA: Diagnosis not present

## 2016-05-31 DIAGNOSIS — D509 Iron deficiency anemia, unspecified: Secondary | ICD-10-CM | POA: Diagnosis not present

## 2016-05-31 DIAGNOSIS — N2581 Secondary hyperparathyroidism of renal origin: Secondary | ICD-10-CM | POA: Diagnosis not present

## 2016-05-31 DIAGNOSIS — Z23 Encounter for immunization: Secondary | ICD-10-CM | POA: Diagnosis not present

## 2016-05-31 DIAGNOSIS — N186 End stage renal disease: Secondary | ICD-10-CM | POA: Diagnosis not present

## 2016-05-31 DIAGNOSIS — I4891 Unspecified atrial fibrillation: Secondary | ICD-10-CM | POA: Diagnosis not present

## 2016-06-02 ENCOUNTER — Telehealth: Payer: Self-pay | Admitting: Vascular Surgery

## 2016-06-02 DIAGNOSIS — N186 End stage renal disease: Secondary | ICD-10-CM | POA: Diagnosis not present

## 2016-06-02 DIAGNOSIS — Z23 Encounter for immunization: Secondary | ICD-10-CM | POA: Diagnosis not present

## 2016-06-02 DIAGNOSIS — N2581 Secondary hyperparathyroidism of renal origin: Secondary | ICD-10-CM | POA: Diagnosis not present

## 2016-06-02 DIAGNOSIS — I4891 Unspecified atrial fibrillation: Secondary | ICD-10-CM | POA: Diagnosis not present

## 2016-06-02 DIAGNOSIS — D509 Iron deficiency anemia, unspecified: Secondary | ICD-10-CM | POA: Diagnosis not present

## 2016-06-02 DIAGNOSIS — D631 Anemia in chronic kidney disease: Secondary | ICD-10-CM | POA: Diagnosis not present

## 2016-06-02 NOTE — Telephone Encounter (Signed)
Sched appt 06/30/16 at 8:30. Lm on hm# to inform pt of appt.

## 2016-06-02 NOTE — Telephone Encounter (Signed)
-----   Message from Mena Goes, RN sent at 05/30/2016  2:47 PM EST ----- Regarding: schedule   ----- Message ----- From: Conrad [place], MD Sent: 05/30/2016   1:55 PM To: [address]  DANIELA HERNAN [id number] 1941/02/11  Procedure: Revision of left upper arm fistula with revision of anastomosis  Asst: Gerri Lins, [facility]   Follow-up: 2 weeks

## 2016-06-03 LAB — PROTIME-INR: INR: 1.2 — AB (ref 0.9–1.1)

## 2016-06-04 DIAGNOSIS — N2581 Secondary hyperparathyroidism of renal origin: Secondary | ICD-10-CM | POA: Diagnosis not present

## 2016-06-04 DIAGNOSIS — N186 End stage renal disease: Secondary | ICD-10-CM | POA: Diagnosis not present

## 2016-06-04 DIAGNOSIS — I4891 Unspecified atrial fibrillation: Secondary | ICD-10-CM | POA: Diagnosis not present

## 2016-06-04 DIAGNOSIS — D509 Iron deficiency anemia, unspecified: Secondary | ICD-10-CM | POA: Diagnosis not present

## 2016-06-04 DIAGNOSIS — Z23 Encounter for immunization: Secondary | ICD-10-CM | POA: Diagnosis not present

## 2016-06-04 DIAGNOSIS — D631 Anemia in chronic kidney disease: Secondary | ICD-10-CM | POA: Diagnosis not present

## 2016-06-06 ENCOUNTER — Telehealth: Payer: Self-pay | Admitting: Family

## 2016-06-06 DIAGNOSIS — Z23 Encounter for immunization: Secondary | ICD-10-CM | POA: Diagnosis not present

## 2016-06-06 DIAGNOSIS — N2581 Secondary hyperparathyroidism of renal origin: Secondary | ICD-10-CM | POA: Diagnosis not present

## 2016-06-06 DIAGNOSIS — I4891 Unspecified atrial fibrillation: Secondary | ICD-10-CM | POA: Diagnosis not present

## 2016-06-06 DIAGNOSIS — N186 End stage renal disease: Secondary | ICD-10-CM | POA: Diagnosis not present

## 2016-06-06 DIAGNOSIS — D509 Iron deficiency anemia, unspecified: Secondary | ICD-10-CM | POA: Diagnosis not present

## 2016-06-06 DIAGNOSIS — D631 Anemia in chronic kidney disease: Secondary | ICD-10-CM | POA: Diagnosis not present

## 2016-06-06 NOTE — Telephone Encounter (Signed)
Spoke to pt re: subtherapeutic INR.  1.17 on 11/16. This was drawn post-op. He is back on coumadin and has apt scheduled with the coumadin clinic on Monday. Advised pt to keep upcoming appointment. Pt verbalizes understanding.

## 2016-06-08 ENCOUNTER — Ambulatory Visit (INDEPENDENT_AMBULATORY_CARE_PROVIDER_SITE_OTHER): Payer: Medicare Other | Admitting: Orthopaedic Surgery

## 2016-06-08 DIAGNOSIS — N186 End stage renal disease: Secondary | ICD-10-CM | POA: Diagnosis not present

## 2016-06-08 DIAGNOSIS — Z96641 Presence of right artificial hip joint: Secondary | ICD-10-CM

## 2016-06-08 DIAGNOSIS — I4891 Unspecified atrial fibrillation: Secondary | ICD-10-CM | POA: Diagnosis not present

## 2016-06-08 DIAGNOSIS — D631 Anemia in chronic kidney disease: Secondary | ICD-10-CM | POA: Diagnosis not present

## 2016-06-08 DIAGNOSIS — Z23 Encounter for immunization: Secondary | ICD-10-CM | POA: Diagnosis not present

## 2016-06-08 DIAGNOSIS — D509 Iron deficiency anemia, unspecified: Secondary | ICD-10-CM | POA: Diagnosis not present

## 2016-06-08 DIAGNOSIS — N2581 Secondary hyperparathyroidism of renal origin: Secondary | ICD-10-CM | POA: Diagnosis not present

## 2016-06-08 MED ORDER — GABAPENTIN 300 MG PO CAPS: 300.0000 mg | ORAL_CAPSULE | Freq: Every day | ORAL | 3 refills | Status: DC

## 2016-06-08 NOTE — Progress Notes (Signed)
Bruce Cole continues to do well at 10 weeks status post a right total hip arthroplasty through direct anterior approach. He has no complaints. He still ambulates with a cane but he is on this for a while due to balance problems not related to his hip he states. He says gabapentin is helped him quite a bit at night. He says is on medication even a refill of. He says that he is definitely better than what he was before surgery.  Examination of his right hip incision shows a well-healed incision. There is no evidence infection. His leg lengths are equal. He taught her some easily putting him through internal/external rotation and flexion-extension of the right hip.  At this point he'll continue increase his activities. I'll send in some new gabapentin for him. We'll see him back in 6 months and I like an AP pelvis and a lateral of his right hip at that visit.

## 2016-06-10 DIAGNOSIS — N186 End stage renal disease: Secondary | ICD-10-CM | POA: Diagnosis not present

## 2016-06-10 DIAGNOSIS — Z23 Encounter for immunization: Secondary | ICD-10-CM | POA: Diagnosis not present

## 2016-06-10 DIAGNOSIS — N2581 Secondary hyperparathyroidism of renal origin: Secondary | ICD-10-CM | POA: Diagnosis not present

## 2016-06-10 DIAGNOSIS — D631 Anemia in chronic kidney disease: Secondary | ICD-10-CM | POA: Diagnosis not present

## 2016-06-10 DIAGNOSIS — I4891 Unspecified atrial fibrillation: Secondary | ICD-10-CM | POA: Diagnosis not present

## 2016-06-10 DIAGNOSIS — D509 Iron deficiency anemia, unspecified: Secondary | ICD-10-CM | POA: Diagnosis not present

## 2016-06-11 DIAGNOSIS — N186 End stage renal disease: Secondary | ICD-10-CM | POA: Diagnosis not present

## 2016-06-11 DIAGNOSIS — N2581 Secondary hyperparathyroidism of renal origin: Secondary | ICD-10-CM | POA: Diagnosis not present

## 2016-06-11 DIAGNOSIS — D631 Anemia in chronic kidney disease: Secondary | ICD-10-CM | POA: Diagnosis not present

## 2016-06-11 DIAGNOSIS — Z23 Encounter for immunization: Secondary | ICD-10-CM | POA: Diagnosis not present

## 2016-06-11 DIAGNOSIS — D509 Iron deficiency anemia, unspecified: Secondary | ICD-10-CM | POA: Diagnosis not present

## 2016-06-11 DIAGNOSIS — I4891 Unspecified atrial fibrillation: Secondary | ICD-10-CM | POA: Diagnosis not present

## 2016-06-13 ENCOUNTER — Ambulatory Visit (INDEPENDENT_AMBULATORY_CARE_PROVIDER_SITE_OTHER): Payer: Medicare Other | Admitting: *Deleted

## 2016-06-13 DIAGNOSIS — I4891 Unspecified atrial fibrillation: Secondary | ICD-10-CM | POA: Diagnosis not present

## 2016-06-13 DIAGNOSIS — Z5181 Encounter for therapeutic drug level monitoring: Secondary | ICD-10-CM

## 2016-06-13 DIAGNOSIS — Z7901 Long term (current) use of anticoagulants: Secondary | ICD-10-CM

## 2016-06-13 LAB — POCT INR: INR: 2.1

## 2016-06-14 DIAGNOSIS — Z23 Encounter for immunization: Secondary | ICD-10-CM | POA: Diagnosis not present

## 2016-06-14 DIAGNOSIS — D631 Anemia in chronic kidney disease: Secondary | ICD-10-CM | POA: Diagnosis not present

## 2016-06-14 DIAGNOSIS — I4891 Unspecified atrial fibrillation: Secondary | ICD-10-CM | POA: Diagnosis not present

## 2016-06-14 DIAGNOSIS — N2581 Secondary hyperparathyroidism of renal origin: Secondary | ICD-10-CM | POA: Diagnosis not present

## 2016-06-14 DIAGNOSIS — D509 Iron deficiency anemia, unspecified: Secondary | ICD-10-CM | POA: Diagnosis not present

## 2016-06-14 DIAGNOSIS — N186 End stage renal disease: Secondary | ICD-10-CM | POA: Diagnosis not present

## 2016-06-15 ENCOUNTER — Encounter: Payer: Self-pay | Admitting: Family

## 2016-06-15 DIAGNOSIS — D509 Iron deficiency anemia, unspecified: Secondary | ICD-10-CM | POA: Diagnosis not present

## 2016-06-15 DIAGNOSIS — N186 End stage renal disease: Secondary | ICD-10-CM | POA: Diagnosis not present

## 2016-06-15 DIAGNOSIS — D631 Anemia in chronic kidney disease: Secondary | ICD-10-CM | POA: Diagnosis not present

## 2016-06-15 DIAGNOSIS — I4891 Unspecified atrial fibrillation: Secondary | ICD-10-CM | POA: Diagnosis not present

## 2016-06-15 DIAGNOSIS — Z23 Encounter for immunization: Secondary | ICD-10-CM | POA: Diagnosis not present

## 2016-06-15 DIAGNOSIS — N2581 Secondary hyperparathyroidism of renal origin: Secondary | ICD-10-CM | POA: Diagnosis not present

## 2016-06-16 DIAGNOSIS — Z992 Dependence on renal dialysis: Secondary | ICD-10-CM | POA: Diagnosis not present

## 2016-06-16 DIAGNOSIS — I158 Other secondary hypertension: Secondary | ICD-10-CM | POA: Diagnosis not present

## 2016-06-16 DIAGNOSIS — N186 End stage renal disease: Secondary | ICD-10-CM | POA: Diagnosis not present

## 2016-06-17 ENCOUNTER — Ambulatory Visit (INDEPENDENT_AMBULATORY_CARE_PROVIDER_SITE_OTHER): Payer: Medicare Other | Admitting: Family

## 2016-06-17 ENCOUNTER — Other Ambulatory Visit: Payer: Self-pay | Admitting: Internal Medicine

## 2016-06-17 ENCOUNTER — Encounter: Payer: Self-pay | Admitting: Family

## 2016-06-17 ENCOUNTER — Telehealth: Payer: Self-pay | Admitting: Family

## 2016-06-17 VITALS — BP 126/59 | Temp 98.4°F | Resp 16 | Ht 75.0 in | Wt 250.6 lb

## 2016-06-17 VITALS — BP 115/62 | HR 57 | Temp 96.8°F | Resp 20 | Ht 75.0 in | Wt 251.2 lb

## 2016-06-17 DIAGNOSIS — Z992 Dependence on renal dialysis: Secondary | ICD-10-CM

## 2016-06-17 DIAGNOSIS — I10 Essential (primary) hypertension: Secondary | ICD-10-CM

## 2016-06-17 DIAGNOSIS — Z48812 Encounter for surgical aftercare following surgery on the circulatory system: Secondary | ICD-10-CM

## 2016-06-17 DIAGNOSIS — N186 End stage renal disease: Secondary | ICD-10-CM

## 2016-06-17 NOTE — Progress Notes (Signed)
Patient left prior to being seen by provider due to another appointment "[place]ross town."  He will reschedule.

## 2016-06-17 NOTE — Telephone Encounter (Signed)
I spoke with the patient to notify him that I did contact the nurse Francisco Capuchin at [facility] [phone] that per Suzanne,NP the chest  catheter can be removed. His AVF is working for him to receive dialysis.  awt

## 2016-06-17 NOTE — Progress Notes (Signed)
Pre visit review using our clinic review tool, if applicable. No additional management support is needed unless otherwise documented below in the visit note. 

## 2016-06-17 NOTE — Progress Notes (Signed)
    Postoperative Access Visit   History of Present Illness  Bruce Cole is a 75 y.o. year old male who presents for postoperative follow-up for: revision of left upper arm fistula with revision of anastomosis on 05-30-16 by  Dr. Bridgett Larsson for distal fistula stenosis. He returns today for 2 weeks follow up, he could not return any sooner.   He reports that he has had 3 HD sessions via the left upper arm AVF with no problems, left side chest tunnelled HD catheter may now be removed. His HD days are T-TH-S.    The patient's incisions are healed.  The patient notes no steal symptoms.  The patient is able to complete their activities of daily living.  The patient's current symptoms are: none.  For VQI Use Only  PRE-ADM LIVING: Home  AMB STATUS: Ambulatory with cane.   Physical Examination Vitals:   06/17/16 1137  BP: 115/62  Pulse: (!) 57  Resp: 20  Temp: (!) 96.8 F (36 C)  TempSrc: Oral  SpO2: 99%  Weight: 251 lb 3.2 oz (113.9 kg)  Height: 6\' 3"  (1.905 m)   Body mass index is 31.4 kg/m.   Left UE: Incision is healed, skin feels warm and normal, hand grip is 5/5, sensation in digits is intact, palpable thrill at AV fistula.   Medical Decision Making  Bruce Cole is a 75 y.o. year old male who is s/p revision of anastomosis on 05-30-16. He reports that he has had 3 HD sessions via the left upper arm AVF with no problems, left side chest tunnelled HD catheter may now be removed. His HD days are T-TH-S.   Follow up with [place] prn.  Thank you for allowing [place] to participate in this patient's care.  NICKEL, Sharmon Leyden, RN, MSN, FNP-C [facility] [place] [facility] Office: [phone]  06/17/2016, 11:54 AM  Clinic MD: Bridgett Larsson

## 2016-06-18 DIAGNOSIS — D631 Anemia in chronic kidney disease: Secondary | ICD-10-CM | POA: Diagnosis not present

## 2016-06-18 DIAGNOSIS — N2581 Secondary hyperparathyroidism of renal origin: Secondary | ICD-10-CM | POA: Diagnosis not present

## 2016-06-18 DIAGNOSIS — I4891 Unspecified atrial fibrillation: Secondary | ICD-10-CM | POA: Diagnosis not present

## 2016-06-18 DIAGNOSIS — N186 End stage renal disease: Secondary | ICD-10-CM | POA: Diagnosis not present

## 2016-06-20 ENCOUNTER — Telehealth: Payer: Self-pay | Admitting: *Deleted

## 2016-06-20 NOTE — Telephone Encounter (Signed)
Pt declines at this time

## 2016-06-21 DIAGNOSIS — N2581 Secondary hyperparathyroidism of renal origin: Secondary | ICD-10-CM | POA: Diagnosis not present

## 2016-06-21 DIAGNOSIS — D631 Anemia in chronic kidney disease: Secondary | ICD-10-CM | POA: Diagnosis not present

## 2016-06-21 DIAGNOSIS — I4891 Unspecified atrial fibrillation: Secondary | ICD-10-CM | POA: Diagnosis not present

## 2016-06-21 DIAGNOSIS — N186 End stage renal disease: Secondary | ICD-10-CM | POA: Diagnosis not present

## 2016-06-23 DIAGNOSIS — I4891 Unspecified atrial fibrillation: Secondary | ICD-10-CM | POA: Diagnosis not present

## 2016-06-23 DIAGNOSIS — N2581 Secondary hyperparathyroidism of renal origin: Secondary | ICD-10-CM | POA: Diagnosis not present

## 2016-06-23 DIAGNOSIS — D631 Anemia in chronic kidney disease: Secondary | ICD-10-CM | POA: Diagnosis not present

## 2016-06-23 DIAGNOSIS — N186 End stage renal disease: Secondary | ICD-10-CM | POA: Diagnosis not present

## 2016-06-24 ENCOUNTER — Ambulatory Visit (INDEPENDENT_AMBULATORY_CARE_PROVIDER_SITE_OTHER): Payer: Medicare Other | Admitting: Family

## 2016-06-24 ENCOUNTER — Encounter: Payer: Self-pay | Admitting: Family

## 2016-06-24 VITALS — BP 120/60 | HR 54 | Temp 98.5°F | Resp 18 | Ht 75.0 in | Wt 252.0 lb

## 2016-06-24 DIAGNOSIS — E1122 Type 2 diabetes mellitus with diabetic chronic kidney disease: Secondary | ICD-10-CM

## 2016-06-24 DIAGNOSIS — I10 Essential (primary) hypertension: Secondary | ICD-10-CM | POA: Diagnosis not present

## 2016-06-24 DIAGNOSIS — E118 Type 2 diabetes mellitus with unspecified complications: Secondary | ICD-10-CM | POA: Diagnosis not present

## 2016-06-24 DIAGNOSIS — Z96641 Presence of right artificial hip joint: Secondary | ICD-10-CM | POA: Diagnosis not present

## 2016-06-24 DIAGNOSIS — Z992 Dependence on renal dialysis: Secondary | ICD-10-CM

## 2016-06-24 DIAGNOSIS — N186 End stage renal disease: Secondary | ICD-10-CM | POA: Diagnosis not present

## 2016-06-24 DIAGNOSIS — E8770 Fluid overload, unspecified: Secondary | ICD-10-CM | POA: Diagnosis not present

## 2016-06-24 LAB — HEMOGLOBIN A1C: Hgb A1c MFr Bld: 5.5 % (ref 4.6–6.5)

## 2016-06-24 NOTE — Progress Notes (Signed)
Pre visit review using our clinic review tool, if applicable. No additional management support is needed unless otherwise documented below in the visit note. 

## 2016-06-24 NOTE — Patient Instructions (Signed)
Please complete lab work prior to leaving.   

## 2016-06-24 NOTE — Assessment & Plan Note (Signed)
Clinically stable post op, continue PT.

## 2016-06-24 NOTE — Progress Notes (Signed)
Subjective:    Patient ID: Bruce Cole, male    DOB: November 08, 1940, 75 y.o.   MRN: [id number]  HPI  Mr. Sgro is a 75 yr old male who presents today for follow up.  1) HTN- mainatained on lasix only.  BP Readings from Last 3 Encounters:  06/24/16 120/60  06/17/16 115/62  06/17/16 (!) 126/59   2) DM2- diet alone. Not on medication.  Lab Results  Component Value Date   HGBA1C 5.4 02/03/2016   HGBA1C 6.7 (H) 11/04/2015   HGBA1C 6.7 (H) 08/04/2015   Lab Results  Component Value Date   MICROALBUR 20.7 (H) 12/16/2014   LDLCALC 69 02/03/2016   CREATININE 6.7 (A) 04/08/2016   ESRD- Continues hemodialysis.  S/p revision of left upper arm fistula with revision of anastomosis on 05-30-16 by Dr. Bridgett Larsson for distal fistula stenosis.  He had recent THA and reports doing well post-op. He is continuing PT.    Gastritis-clinically stable, no longer using protonix.   Review of Systems See HPI  Past Medical History:  Diagnosis Date  . Acute blood loss anemia   . Atrial fibrillation (HCC)    Chronic  . Chronic combined systolic and diastolic heart failure ([place])   . Colon polyp 2000  . Dysrhythmia    hx  . ESRD on hemodialysis ([place])    [place]  . Essential hypertension   . Gastritis and gastroduodenitis   . GI bleed   . Gout   . Heart murmur   . History of blood transfusion   . History of kidney stones   . Nephrolithiasis   . OSA (obstructive sleep apnea) 09/02/2013    IMPRESSION :  1. Mild obstructive sleep apnea with hypopneas causing sleep fragmentation and moderate oxygen desaturation.  2. Short runs of nonsustained VT were noted. His beta blocker may need to be titrated 3. Significant PLM's were noted, the PLM arousal index was low. Please correlate with clinical history of restless leg syndrome.  4. Sleep efficiency was poor.  RECOMMENDATION:  1. Treatment options for this degree of sleep disordered breathing include weight loss and positional therapy to  avoid supine sleep 2. Consider titrating beta blocker further, defer to cardiologist 3. Patient should be cautioned against driving when sleepy.They should be asked to avoid medications with sedative side effects     . Osteoarthritis of right hip   . Pneumonia    hx 30 yrs ago  . Primary osteoarthritis of right hip   . Rheumatoid arthritis ([place])   . Thrombocytopenia [facility])      Social History   Social History  . Marital status: Single    Spouse name: N/A  . Number of children: 3  . Years of education: N/A   Occupational History  . retired Programmer, multimedia  Retired   Social History Main Topics  . Smoking status: Never Smoker  . Smokeless tobacco: Never Used  . Alcohol use No     Comment: occasional  . Drug use: No  . Sexual activity: Not on file   Other Topics Concern  . Not on file   Social History Narrative   Former [employer]   Admitted to [facility] 11/25/15   Widowed   Never smoked   FULL CODE    Past Surgical History:  Procedure Laterality Date  . AV FISTULA PLACEMENT Left 08/26/2015   Procedure: LEFT RADIOCEPHALIC FISTULA CREATION;  Surgeon: Rosetta Posner, MD;  Location: [facility]  OR;  Service: Vascular;  Laterality: Left;  . AV FISTULA PLACEMENT Left 11/23/2015   Procedure: ARTERIOVENOUS (AV) FISTULA CREATION;  Surgeon: Rosetta Posner, MD;  Location: [facility];  Service: Vascular;  Laterality: Left;  . BACK SURGERY     x2- discectomy  . CHOLECYSTECTOMY  1994  . CYSTOSCOPY/RETROGRADE/URETEROSCOPY/STONE EXTRACTION WITH BASKET    . ESOPHAGOGASTRODUODENOSCOPY N/A 11/13/2015   Procedure: ESOPHAGOGASTRODUODENOSCOPY (EGD);  Surgeon: Irene Shipper, MD;  Location: [facility] ENDOSCOPY;  Service: Endoscopy;  Laterality: N/A;  . INSERTION OF DIALYSIS CATHETER Left 11/23/2015   Procedure: INSERTION OF DIALYSIS CATHETER;  Surgeon: Rosetta Posner, MD;  Location: [facility];  Service: Vascular;  Laterality: Left;  . JOINT REPLACEMENT     Total L-Hip replacement, Right Knee 10/20/09    . LEFT AND RIGHT HEART CATHETERIZATION WITH CORONARY ANGIOGRAM N/A 02/22/2013   Procedure: LEFT AND RIGHT HEART CATHETERIZATION WITH CORONARY ANGIOGRAM;  Surgeon: Jolaine Artist, MD;  [facility] CATH LAB;  Service: Cardiovascular;  Laterality: N/A;  . LITHOTRIPSY  90's  . PERIPHERAL VASCULAR CATHETERIZATION Left 11/19/2015   Procedure: A/V/Fistulagram;  Surgeon: Conrad [place], MD;  Location: [facility] CV LAB;  Service: Cardiovascular;  Laterality: Left;  . REVISON OF ARTERIOVENOUS FISTULA Left 05/30/2016   Procedure: REVISION LEFT UPPER ARM FISTULA;  Surgeon: Conrad Keith, MD;  Location: [facility];  Service: Vascular;  Laterality: Left;  . SPINE SURGERY     x 2  . TOTAL HIP ARTHROPLASTY Right 03/22/2016   Procedure: RIGHT TOTAL HIP ARTHROPLASTY ANTERIOR APPROACH;  Surgeon: Mcarthur Rossetti, MD;  Location: [facility];  Service: Orthopedics;  Laterality: Right;    Family History  Problem Relation Age of Onset  . Hypertension Mother   . Arthritis Mother     ?RA  . Hypertension Father     Allergies  Allergen Reactions  . Ace Inhibitors Other (See Comments)    Worsening renal insufficiency    Current Outpatient Prescriptions on File Prior to Visit  Medication Sig Dispense Refill  . acetaminophen (TYLENOL) 500 MG tablet Take 500 mg by mouth every 6 (six) hours as needed for mild pain.    [name] albuterol (PROVENTIL) (2.5 MG/3ML) 0.083% nebulizer solution Take 2.5 mg by nebulization every 4 (four) hours as needed for shortness of breath. Reported on 09/25/2015    . allopurinol (ZYLOPRIM) 100 MG tablet Take 2 tablets (200 mg total) by mouth daily. 30 tablet 0  . B Complex-C-Folic Acid (RENA-VITE RX) 1 MG TABS Take 1 tablet by mouth at bedtime.   6  . fluticasone (FLONASE) 50 MCG/ACT nasal spray Place 2 sprays into both nostrils daily. (Patient taking differently: Place 2 sprays into both nostrils daily as needed for allergies. ) 16 g 6  . furosemide (LASIX) 80 MG tablet Take 80mg s daily on   Mondays, Wednesdays, Fridays, and Sundays    . gabapentin (NEURONTIN) 300 MG capsule Take 1 capsule (300 mg total) by mouth at bedtime. 30 capsule 3  . HYDROcodone-acetaminophen (NORCO) 5-325 MG tablet Take 1 tablet by mouth every 6 (six) hours as needed for moderate pain. One to two tabs every 4-6 hours for pain 6 tablet 0  . hydroxychloroquine (PLAQUENIL) 200 MG tablet Take 200 mg by mouth 2 (two) times daily. Reported on 01/13/2016    . pantoprazole (PROTONIX) 40 MG tablet Take 1 tablet (40 mg total) by mouth 2 (two) times daily. (Patient taking differently: Take 40 mg by mouth 2 (two) times daily as needed (indigestion). )  60 tablet 0  . polyethylene glycol (MIRALAX / GLYCOLAX) packet Take 17 g by mouth daily as needed for mild constipation.     [name] RENVELA 800 MG tablet Take 800 mg by mouth 3 (three) times daily with meals.   0  . sennosides-docusate sodium (SENOKOT-S) 8.6-50 MG tablet Take 2 tablets by mouth 2 (two) times daily as needed for constipation.     [name] warfarin (COUMADIN) 7.5 MG tablet Take 7.5 mg by mouth every evening.     . warfarin (COUMADIN) 7.5 MG tablet TAKE 1 TABLET BY MOUTH DAILY AS DIRECTED BY COUMADIN CLINIC 40 tablet 3   No current facility-administered medications on file prior to visit.     BP 120/60 (BP Location: Right Arm, Cuff Size: Large)   Pulse (!) 54   Temp 98.5 F (36.9 C) (Oral)   Resp 18   Ht 6\' 3"  (1.905 m)   Wt 252 lb (114.3 kg)   SpO2 97% Comment: room air  BMI 31.50 kg/m       Objective:   Physical Exam  Constitutional: He is oriented to person, place, and time. He appears well-developed and well-nourished. No distress.  HENT:  Head: Normocephalic and atraumatic.  Cardiovascular: Normal rate and regular rhythm.   No murmur heard. Pulmonary/Chest: Effort normal and breath sounds normal. No respiratory distress. He has no wheezes. He has no rales.  Musculoskeletal: He exhibits no edema.  Neurological: He is alert and oriented to person, place,  and time.  Skin: Skin is warm and dry.  Psychiatric: He has a normal mood and affect. His behavior is normal. Thought content normal.          Assessment & Plan:

## 2016-06-24 NOTE — Assessment & Plan Note (Signed)
Stable, continue lasix.  Dialysis has greatly improved his blood pressure.

## 2016-06-24 NOTE — Assessment & Plan Note (Signed)
Continue HD.  His volume status is much improved since starting HD.

## 2016-06-24 NOTE — Assessment & Plan Note (Signed)
Clinically stable. Obtain follow up A1C.

## 2016-06-25 ENCOUNTER — Encounter: Payer: Self-pay | Admitting: Family

## 2016-06-25 DIAGNOSIS — N186 End stage renal disease: Secondary | ICD-10-CM | POA: Diagnosis not present

## 2016-06-25 DIAGNOSIS — D631 Anemia in chronic kidney disease: Secondary | ICD-10-CM | POA: Diagnosis not present

## 2016-06-25 DIAGNOSIS — I4891 Unspecified atrial fibrillation: Secondary | ICD-10-CM | POA: Diagnosis not present

## 2016-06-25 DIAGNOSIS — N2581 Secondary hyperparathyroidism of renal origin: Secondary | ICD-10-CM | POA: Diagnosis not present

## 2016-06-28 DIAGNOSIS — D631 Anemia in chronic kidney disease: Secondary | ICD-10-CM | POA: Diagnosis not present

## 2016-06-28 DIAGNOSIS — I4891 Unspecified atrial fibrillation: Secondary | ICD-10-CM | POA: Diagnosis not present

## 2016-06-28 DIAGNOSIS — N186 End stage renal disease: Secondary | ICD-10-CM | POA: Diagnosis not present

## 2016-06-28 DIAGNOSIS — N2581 Secondary hyperparathyroidism of renal origin: Secondary | ICD-10-CM | POA: Diagnosis not present

## 2016-06-29 DIAGNOSIS — Z452 Encounter for adjustment and management of vascular access device: Secondary | ICD-10-CM | POA: Diagnosis not present

## 2016-06-30 ENCOUNTER — Encounter: Payer: Medicare Other | Admitting: Vascular Surgery

## 2016-06-30 ENCOUNTER — Telehealth: Payer: Self-pay | Admitting: Family

## 2016-06-30 DIAGNOSIS — N186 End stage renal disease: Secondary | ICD-10-CM | POA: Diagnosis not present

## 2016-06-30 DIAGNOSIS — D631 Anemia in chronic kidney disease: Secondary | ICD-10-CM | POA: Diagnosis not present

## 2016-06-30 DIAGNOSIS — N2581 Secondary hyperparathyroidism of renal origin: Secondary | ICD-10-CM | POA: Diagnosis not present

## 2016-06-30 DIAGNOSIS — I4891 Unspecified atrial fibrillation: Secondary | ICD-10-CM | POA: Diagnosis not present

## 2016-06-30 NOTE — Telephone Encounter (Signed)
Called patient to schedule annual wellness visit. Patient did not answer. Left message for patient to call office to schedule appointment.

## 2016-07-02 DIAGNOSIS — N186 End stage renal disease: Secondary | ICD-10-CM | POA: Diagnosis not present

## 2016-07-02 DIAGNOSIS — D631 Anemia in chronic kidney disease: Secondary | ICD-10-CM | POA: Diagnosis not present

## 2016-07-02 DIAGNOSIS — I4891 Unspecified atrial fibrillation: Secondary | ICD-10-CM | POA: Diagnosis not present

## 2016-07-02 DIAGNOSIS — N2581 Secondary hyperparathyroidism of renal origin: Secondary | ICD-10-CM | POA: Diagnosis not present

## 2016-07-05 DIAGNOSIS — N186 End stage renal disease: Secondary | ICD-10-CM | POA: Diagnosis not present

## 2016-07-05 DIAGNOSIS — N2581 Secondary hyperparathyroidism of renal origin: Secondary | ICD-10-CM | POA: Diagnosis not present

## 2016-07-05 DIAGNOSIS — I4891 Unspecified atrial fibrillation: Secondary | ICD-10-CM | POA: Diagnosis not present

## 2016-07-05 DIAGNOSIS — D631 Anemia in chronic kidney disease: Secondary | ICD-10-CM | POA: Diagnosis not present

## 2016-07-07 DIAGNOSIS — N186 End stage renal disease: Secondary | ICD-10-CM | POA: Diagnosis not present

## 2016-07-07 DIAGNOSIS — I4891 Unspecified atrial fibrillation: Secondary | ICD-10-CM | POA: Diagnosis not present

## 2016-07-07 DIAGNOSIS — N2581 Secondary hyperparathyroidism of renal origin: Secondary | ICD-10-CM | POA: Diagnosis not present

## 2016-07-07 DIAGNOSIS — D631 Anemia in chronic kidney disease: Secondary | ICD-10-CM | POA: Diagnosis not present

## 2016-07-09 DIAGNOSIS — D631 Anemia in chronic kidney disease: Secondary | ICD-10-CM | POA: Diagnosis not present

## 2016-07-09 DIAGNOSIS — N2581 Secondary hyperparathyroidism of renal origin: Secondary | ICD-10-CM | POA: Diagnosis not present

## 2016-07-09 DIAGNOSIS — N186 End stage renal disease: Secondary | ICD-10-CM | POA: Diagnosis not present

## 2016-07-09 DIAGNOSIS — I4891 Unspecified atrial fibrillation: Secondary | ICD-10-CM | POA: Diagnosis not present

## 2016-07-12 DIAGNOSIS — I4891 Unspecified atrial fibrillation: Secondary | ICD-10-CM | POA: Diagnosis not present

## 2016-07-12 DIAGNOSIS — D631 Anemia in chronic kidney disease: Secondary | ICD-10-CM | POA: Diagnosis not present

## 2016-07-12 DIAGNOSIS — N2581 Secondary hyperparathyroidism of renal origin: Secondary | ICD-10-CM | POA: Diagnosis not present

## 2016-07-12 DIAGNOSIS — N186 End stage renal disease: Secondary | ICD-10-CM | POA: Diagnosis not present

## 2016-07-13 ENCOUNTER — Ambulatory Visit (INDEPENDENT_AMBULATORY_CARE_PROVIDER_SITE_OTHER): Payer: Medicare Other | Admitting: *Deleted

## 2016-07-13 DIAGNOSIS — I4891 Unspecified atrial fibrillation: Secondary | ICD-10-CM

## 2016-07-13 DIAGNOSIS — Z5181 Encounter for therapeutic drug level monitoring: Secondary | ICD-10-CM | POA: Diagnosis not present

## 2016-07-13 LAB — POCT INR: INR: 1.7

## 2016-07-14 DIAGNOSIS — N186 End stage renal disease: Secondary | ICD-10-CM | POA: Diagnosis not present

## 2016-07-14 DIAGNOSIS — D631 Anemia in chronic kidney disease: Secondary | ICD-10-CM | POA: Diagnosis not present

## 2016-07-14 DIAGNOSIS — N2581 Secondary hyperparathyroidism of renal origin: Secondary | ICD-10-CM | POA: Diagnosis not present

## 2016-07-14 DIAGNOSIS — I4891 Unspecified atrial fibrillation: Secondary | ICD-10-CM | POA: Diagnosis not present

## 2016-07-15 LAB — PROTIME-INR

## 2016-07-16 ENCOUNTER — Encounter (HOSPITAL_COMMUNITY): Payer: Self-pay | Admitting: Emergency Medicine

## 2016-07-16 ENCOUNTER — Inpatient Hospital Stay (HOSPITAL_COMMUNITY)
Admission: EM | Admit: 2016-07-16 | Discharge: 2016-07-23 | DRG: 252 | Disposition: A | Discharge status: Home / Self Care | Payer: Medicare Other | Attending: Internal Medicine | Admitting: Internal Medicine

## 2016-07-16 ENCOUNTER — Emergency Department (HOSPITAL_COMMUNITY): Payer: Medicare Other

## 2016-07-16 DIAGNOSIS — I158 Other secondary hypertension: Secondary | ICD-10-CM | POA: Diagnosis not present

## 2016-07-16 DIAGNOSIS — R0602 Shortness of breath: Secondary | ICD-10-CM | POA: Diagnosis not present

## 2016-07-16 DIAGNOSIS — R531 Weakness: Secondary | ICD-10-CM | POA: Diagnosis not present

## 2016-07-16 DIAGNOSIS — Z7901 Long term (current) use of anticoagulants: Secondary | ICD-10-CM

## 2016-07-16 DIAGNOSIS — E872 Acidosis: Secondary | ICD-10-CM | POA: Diagnosis present

## 2016-07-16 DIAGNOSIS — I4891 Unspecified atrial fibrillation: Secondary | ICD-10-CM | POA: Diagnosis present

## 2016-07-16 DIAGNOSIS — Z96643 Presence of artificial hip joint, bilateral: Secondary | ICD-10-CM | POA: Diagnosis present

## 2016-07-16 DIAGNOSIS — Z992 Dependence on renal dialysis: Secondary | ICD-10-CM

## 2016-07-16 DIAGNOSIS — T82858A Stenosis of vascular prosthetic devices, implants and grafts, initial encounter: Secondary | ICD-10-CM | POA: Diagnosis present

## 2016-07-16 DIAGNOSIS — I5042 Chronic combined systolic (congestive) and diastolic (congestive) heart failure: Secondary | ICD-10-CM | POA: Diagnosis present

## 2016-07-16 DIAGNOSIS — T82898A Other specified complication of vascular prosthetic devices, implants and grafts, initial encounter: Secondary | ICD-10-CM | POA: Diagnosis not present

## 2016-07-16 DIAGNOSIS — N2581 Secondary hyperparathyroidism of renal origin: Secondary | ICD-10-CM | POA: Diagnosis present

## 2016-07-16 DIAGNOSIS — N186 End stage renal disease: Secondary | ICD-10-CM

## 2016-07-16 DIAGNOSIS — K219 Gastro-esophageal reflux disease without esophagitis: Secondary | ICD-10-CM | POA: Diagnosis present

## 2016-07-16 DIAGNOSIS — E1129 Type 2 diabetes mellitus with other diabetic kidney complication: Secondary | ICD-10-CM | POA: Diagnosis present

## 2016-07-16 DIAGNOSIS — M069 Rheumatoid arthritis, unspecified: Secondary | ICD-10-CM | POA: Diagnosis present

## 2016-07-16 DIAGNOSIS — E875 Hyperkalemia: Secondary | ICD-10-CM | POA: Diagnosis present

## 2016-07-16 DIAGNOSIS — D631 Anemia in chronic kidney disease: Secondary | ICD-10-CM | POA: Diagnosis present

## 2016-07-16 DIAGNOSIS — Y712 Prosthetic and other implants, materials and accessory cardiovascular devices associated with adverse incidents: Secondary | ICD-10-CM | POA: Diagnosis present

## 2016-07-16 DIAGNOSIS — I132 Hypertensive heart and chronic kidney disease with heart failure and with stage 5 chronic kidney disease, or end stage renal disease: Secondary | ICD-10-CM | POA: Diagnosis present

## 2016-07-16 DIAGNOSIS — G4733 Obstructive sleep apnea (adult) (pediatric): Secondary | ICD-10-CM | POA: Diagnosis present

## 2016-07-16 DIAGNOSIS — T82590A Other mechanical complication of surgically created arteriovenous fistula, initial encounter: Secondary | ICD-10-CM | POA: Diagnosis not present

## 2016-07-16 DIAGNOSIS — A084 Viral intestinal infection, unspecified: Secondary | ICD-10-CM | POA: Diagnosis present

## 2016-07-16 DIAGNOSIS — Z79899 Other long term (current) drug therapy: Secondary | ICD-10-CM | POA: Diagnosis not present

## 2016-07-16 DIAGNOSIS — R42 Dizziness and giddiness: Secondary | ICD-10-CM | POA: Diagnosis not present

## 2016-07-16 DIAGNOSIS — E1122 Type 2 diabetes mellitus with diabetic chronic kidney disease: Secondary | ICD-10-CM | POA: Diagnosis present

## 2016-07-16 DIAGNOSIS — I482 Chronic atrial fibrillation: Secondary | ICD-10-CM | POA: Diagnosis not present

## 2016-07-16 DIAGNOSIS — M109 Gout, unspecified: Secondary | ICD-10-CM | POA: Diagnosis present

## 2016-07-16 DIAGNOSIS — I517 Cardiomegaly: Secondary | ICD-10-CM | POA: Diagnosis not present

## 2016-07-16 DIAGNOSIS — I120 Hypertensive chronic kidney disease with stage 5 chronic kidney disease or end stage renal disease: Secondary | ICD-10-CM | POA: Diagnosis not present

## 2016-07-16 DIAGNOSIS — I10 Essential (primary) hypertension: Secondary | ICD-10-CM | POA: Diagnosis present

## 2016-07-16 LAB — CBC
HCT: 34 % — ABNORMAL LOW (ref 39.0–52.0)
Hemoglobin: 10.6 g/dL — ABNORMAL LOW (ref 13.0–17.0)
MCH: 29.8 pg (ref 26.0–34.0)
MCHC: 31.2 g/dL (ref 30.0–36.0)
MCV: 95.5 fL (ref 78.0–100.0)
PLATELETS: 146 10*3/uL — AB (ref 150–400)
RBC: 3.56 MIL/uL — AB (ref 4.22–5.81)
RDW: 15.6 % — ABNORMAL HIGH (ref 11.5–15.5)
WBC: 9 10*3/uL (ref 4.0–10.5)

## 2016-07-16 LAB — COMPREHENSIVE METABOLIC PANEL
ALBUMIN: 3.7 g/dL (ref 3.5–5.0)
ALT: 17 U/L (ref 17–63)
AST: 28 U/L (ref 15–41)
Alkaline Phosphatase: 137 U/L — ABNORMAL HIGH (ref 38–126)
Anion gap: 12 (ref 5–15)
BILIRUBIN TOTAL: 0.6 mg/dL (ref 0.3–1.2)
BUN: 67 mg/dL — AB (ref 6–20)
CHLORIDE: 108 mmol/L (ref 101–111)
CO2: 16 mmol/L — ABNORMAL LOW (ref 22–32)
Calcium: 9.2 mg/dL (ref 8.9–10.3)
Creatinine, Ser: 9.9 mg/dL — ABNORMAL HIGH (ref 0.61–1.24)
GFR calc Af Amer: 5 mL/min — ABNORMAL LOW (ref >60)
GFR calc non Af Amer: 5 mL/min — ABNORMAL LOW (ref >60)
GLUCOSE: 139 mg/dL — AB (ref 65–99)
Sodium: 136 mmol/L (ref 135–145)
Total Protein: 7.9 g/dL (ref 6.5–8.1)

## 2016-07-16 LAB — CBC WITH DIFFERENTIAL/PLATELET
BASOS ABS: 0 10*3/uL (ref 0.0–0.1)
BASOS PCT: 0 %
Eosinophils Absolute: 0.1 10*3/uL (ref 0.0–0.7)
Eosinophils Relative: 1 %
HEMATOCRIT: 37.7 % — AB (ref 39.0–52.0)
Hemoglobin: 12 g/dL — ABNORMAL LOW (ref 13.0–17.0)
Lymphocytes Relative: 10 %
Lymphs Abs: 1 10*3/uL (ref 0.7–4.0)
MCH: 30.8 pg (ref 26.0–34.0)
MCHC: 31.8 g/dL (ref 30.0–36.0)
MCV: 96.7 fL (ref 78.0–100.0)
MONO ABS: 0.6 10*3/uL (ref 0.1–1.0)
Monocytes Relative: 7 %
NEUTROS ABS: 8.1 10*3/uL — AB (ref 1.7–7.7)
Neutrophils Relative %: 82 %
PLATELETS: 165 10*3/uL (ref 150–400)
RBC: 3.9 MIL/uL — ABNORMAL LOW (ref 4.22–5.81)
RDW: 15.9 % — AB (ref 11.5–15.5)
WBC: 9.8 10*3/uL (ref 4.0–10.5)

## 2016-07-16 LAB — TROPONIN I: Troponin I: 0.07 ng/mL (ref <0.03)

## 2016-07-16 LAB — RENAL FUNCTION PANEL
ALBUMIN: 3.9 g/dL (ref 3.5–5.0)
Albumin: 3.3 g/dL — ABNORMAL LOW (ref 3.5–5.0)
Anion gap: 12 (ref 5–15)
Anion gap: 12 (ref 5–15)
BUN: 35 mg/dL — ABNORMAL HIGH (ref 6–20)
BUN: 25 mg/dL — AB (ref 6–20)
CALCIUM: 9.3 mg/dL (ref 8.9–10.3)
CHLORIDE: 97 mmol/L — AB (ref 101–111)
CO2: 24 mmol/L (ref 22–32)
CO2: 28 mmol/L (ref 22–32)
CREATININE: 5.9 mg/dL — AB (ref 0.61–1.24)
CREATININE: 5.83 mg/dL — AB (ref 0.61–1.24)
Calcium: 9.1 mg/dL (ref 8.9–10.3)
Chloride: 101 mmol/L (ref 101–111)
GFR calc Af Amer: 10 mL/min — ABNORMAL LOW (ref >60)
GFR calc non Af Amer: 8 mL/min — ABNORMAL LOW (ref >60)
GFR calc non Af Amer: 9 mL/min — ABNORMAL LOW (ref >60)
GFR, EST AFRICAN AMERICAN: 10 mL/min — AB (ref >60)
GLUCOSE: 115 mg/dL — AB (ref 65–99)
Glucose, Bld: 134 mg/dL — ABNORMAL HIGH (ref 65–99)
PHOSPHORUS: 2.8 mg/dL (ref 2.5–4.6)
POTASSIUM: 5.3 mmol/L — AB (ref 3.5–5.1)
Phosphorus: 3.8 mg/dL (ref 2.5–4.6)
Potassium: 6 mmol/L — ABNORMAL HIGH (ref 3.5–5.1)
SODIUM: 137 mmol/L (ref 135–145)
Sodium: 137 mmol/L (ref 135–145)

## 2016-07-16 LAB — CBG MONITORING, ED: Glucose-Capillary: 148 mg/dL — ABNORMAL HIGH (ref 65–99)

## 2016-07-16 LAB — GLUCOSE, CAPILLARY
GLUCOSE-CAPILLARY: 160 mg/dL — AB (ref 65–99)
GLUCOSE-CAPILLARY: 95 mg/dL (ref 65–99)
Glucose-Capillary: 91 mg/dL (ref 65–99)

## 2016-07-16 LAB — PROTIME-INR
INR: 2.78
Prothrombin Time: 30 seconds — ABNORMAL HIGH (ref 11.4–15.2)

## 2016-07-16 LAB — MAGNESIUM: MAGNESIUM: 2.7 mg/dL — AB (ref 1.7–2.4)

## 2016-07-16 LAB — APTT: APTT: 44 s — AB (ref 24–36)

## 2016-07-16 LAB — HEPATITIS B SURFACE ANTIGEN: HEP B S AG: NEGATIVE

## 2016-07-16 MED ORDER — LIDOCAINE-PRILOCAINE 2.5-2.5 % EX CREA
1.0000 "application " | TOPICAL_CREAM | CUTANEOUS | Status: DC | PRN
  Filled 2016-07-16: qty 5

## 2016-07-16 MED ORDER — INSULIN ASPART 100 UNIT/ML ~~LOC~~ SOLN: 0.0000 [IU] | Freq: Every day | SUBCUTANEOUS | Status: DC

## 2016-07-16 MED ORDER — SODIUM CHLORIDE 0.9 % IV SOLN
1.0000 g | Freq: Once | INTRAVENOUS | Status: AC
  Administered 2016-07-16: 1 g via INTRAVENOUS
  Filled 2016-07-16: qty 10

## 2016-07-16 MED ORDER — PANTOPRAZOLE SODIUM 40 MG PO TBEC
40.0000 mg | DELAYED_RELEASE_TABLET | Freq: Every day | ORAL | Status: DC
  Administered 2016-07-16 – 2016-07-23 (×7): 40 mg via ORAL
  Filled 2016-07-16 (×8): qty 1

## 2016-07-16 MED ORDER — SEVELAMER CARBONATE 800 MG PO TABS
2400.0000 mg | ORAL_TABLET | Freq: Three times a day (TID) | ORAL | Status: DC
  Administered 2016-07-16 – 2016-07-23 (×18): 2400 mg via ORAL
  Filled 2016-07-16 (×19): qty 3

## 2016-07-16 MED ORDER — HEPARIN SODIUM (PORCINE) 1000 UNIT/ML DIALYSIS
100.0000 [IU]/kg | INTRAMUSCULAR | Status: DC | PRN
  Filled 2016-07-16: qty 11

## 2016-07-16 MED ORDER — HEPARIN SODIUM (PORCINE) 1000 UNIT/ML DIALYSIS
1000.0000 [IU] | INTRAMUSCULAR | Status: DC | PRN
  Filled 2016-07-16: qty 1

## 2016-07-16 MED ORDER — WARFARIN - PHYSICIAN DOSING INPATIENT
Freq: Every day | Status: DC
  Administered 2016-07-16 – 2016-07-19 (×2)

## 2016-07-16 MED ORDER — FUROSEMIDE 10 MG/ML IJ SOLN
80.0000 mg | Freq: Once | INTRAMUSCULAR | Status: AC
  Administered 2016-07-16: 80 mg via INTRAVENOUS
  Filled 2016-07-16: qty 8

## 2016-07-16 MED ORDER — SODIUM CHLORIDE 0.9 % IV SOLN: 100.0000 mL | INTRAVENOUS | Status: DC | PRN

## 2016-07-16 MED ORDER — WARFARIN SODIUM 7.5 MG PO TABS
7.5000 mg | ORAL_TABLET | Freq: Every day | ORAL | Status: DC
  Administered 2016-07-16 – 2016-07-17 (×2): 7.5 mg via ORAL
  Filled 2016-07-16 (×3): qty 1

## 2016-07-16 MED ORDER — SODIUM BICARBONATE 8.4 % IV SOLN
50.0000 meq | Freq: Once | INTRAVENOUS | Status: AC
  Administered 2016-07-16: 50 meq via INTRAVENOUS
  Filled 2016-07-16: qty 50

## 2016-07-16 MED ORDER — INSULIN ASPART 100 UNIT/ML ~~LOC~~ SOLN
2.0000 [IU] | SUBCUTANEOUS | Status: DC
  Administered 2016-07-16: 4 [IU] via SUBCUTANEOUS

## 2016-07-16 MED ORDER — PENTAFLUOROPROP-TETRAFLUOROETH EX AERO
1.0000 | INHALATION_SPRAY | CUTANEOUS | Status: DC | PRN
Start: 2016-07-16 — End: 2016-07-19

## 2016-07-16 MED ORDER — HYDROXYCHLOROQUINE SULFATE 200 MG PO TABS
200.0000 mg | ORAL_TABLET | Freq: Every day | ORAL | Status: DC
  Administered 2016-07-16 – 2016-07-23 (×8): 200 mg via ORAL
  Filled 2016-07-16 (×9): qty 1

## 2016-07-16 MED ORDER — DEXTROSE 50 % IV SOLN
1.0000 | Freq: Once | INTRAVENOUS | Status: AC
  Administered 2016-07-16: 50 mL via INTRAVENOUS
  Filled 2016-07-16: qty 50

## 2016-07-16 MED ORDER — LIDOCAINE HCL (PF) 1 % IJ SOLN: 5.0000 mL | INTRAMUSCULAR | Status: DC | PRN

## 2016-07-16 MED ORDER — RENA-VITE PO TABS
1.0000 | ORAL_TABLET | Freq: Every day | ORAL | Status: DC
  Administered 2016-07-16 – 2016-07-22 (×7): 1 via ORAL
  Filled 2016-07-16 (×8): qty 1

## 2016-07-16 MED ORDER — DOXERCALCIFEROL 4 MCG/2ML IV SOLN
INTRAVENOUS | Status: AC
Start: 2016-07-16 — End: 2016-07-16
  Filled 2016-07-16: qty 2

## 2016-07-16 MED ORDER — INSULIN ASPART 100 UNIT/ML ~~LOC~~ SOLN
6.0000 [IU] | Freq: Once | SUBCUTANEOUS | Status: AC
  Administered 2016-07-16: 6 [IU] via INTRAVENOUS
  Filled 2016-07-16: qty 1

## 2016-07-16 MED ORDER — PENTAFLUOROPROP-TETRAFLUOROETH EX AERO
1.0000 "application " | INHALATION_SPRAY | CUTANEOUS | Status: DC | PRN
  Filled 2016-07-16: qty 30

## 2016-07-16 MED ORDER — GABAPENTIN 300 MG PO CAPS
300.0000 mg | ORAL_CAPSULE | Freq: Every day | ORAL | Status: DC
  Administered 2016-07-16 – 2016-07-22 (×7): 300 mg via ORAL
  Filled 2016-07-16 (×7): qty 1

## 2016-07-16 MED ORDER — INSULIN ASPART 100 UNIT/ML ~~LOC~~ SOLN: 0.0000 [IU] | Freq: Three times a day (TID) | SUBCUTANEOUS | Status: DC

## 2016-07-16 MED ORDER — ALLOPURINOL 100 MG PO TABS
100.0000 mg | ORAL_TABLET | Freq: Every day | ORAL | Status: DC
  Administered 2016-07-16 – 2016-07-23 (×8): 100 mg via ORAL
  Filled 2016-07-16 (×8): qty 1

## 2016-07-16 MED ORDER — HYDROCODONE-ACETAMINOPHEN 5-325 MG PO TABS
1.0000 | ORAL_TABLET | ORAL | Status: DC | PRN
  Administered 2016-07-19: 1 via ORAL
  Administered 2016-07-20 – 2016-07-23 (×3): 2 via ORAL
  Filled 2016-07-16 (×3): qty 2
  Filled 2016-07-16: qty 1

## 2016-07-16 MED ORDER — ALTEPLASE 2 MG IJ SOLR: 2.0000 mg | Freq: Once | INTRAMUSCULAR | Status: DC | PRN

## 2016-07-16 MED ORDER — DOXERCALCIFEROL 4 MCG/2ML IV SOLN
2.0000 ug | INTRAVENOUS | Status: DC
  Administered 2016-07-16 – 2016-07-23 (×4): 2 ug via INTRAVENOUS
  Filled 2016-07-16 (×4): qty 2

## 2016-07-16 MED ORDER — HEPARIN SODIUM (PORCINE) 1000 UNIT/ML DIALYSIS
6000.0000 [IU] | INTRAMUSCULAR | Status: DC | PRN
  Filled 2016-07-16: qty 6

## 2016-07-16 MED ORDER — SODIUM CHLORIDE 0.9 % IV SOLN: 250.0000 mL | INTRAVENOUS | Status: DC | PRN

## 2016-07-16 NOTE — Consult Note (Signed)
Reason for Consult:Hyperkalemia Referring Physician: Dr. Melton Alar is an 75 y.o. male.  HPI: 75 yr male on HD 6 mon at [facility], this evening severe weakness,numbness, falling, cannot walk.  Had loose stool but minimal.  Also SOB, coughing up whitish phlegm.  Admits to xs citrus , potatoes, and fluid.  HD TTS at [facility].  Did full HD on Thurs. [name]  Hx Afib, CHF, HTN  GIB, renal stones, gout, RA, obesity, OSA.  No fevers, chills, abdm pain or other discomfort.  Constitutional: as above Eyes: negative Ears, nose, mouth, throat, and face: negative Respiratory: as above Cardiovascular: as above, also swelling Gastrointestinal: few loose stools Genitourinary:negative Integument/breast: negative Hematologic/lymphatic: anemia Allergic/Immunologic: aCEI    Dialyzes at [facility] on TTS since 5/17 . Primary Nephrologist [place]. EDW 107.5 kg. HD Bath 2K, 2.25 Ca, Dialyzer 180NR, Heparin 6000. Access LUA AVF .  Past Medical History:  Diagnosis Date  . Acute blood loss anemia   . Atrial fibrillation (HCC)    Chronic  . Chronic combined systolic and diastolic heart failure ([place])   . Colon polyp 2000  . Dysrhythmia    hx  . ESRD on hemodialysis ([place])    [place]  . Essential hypertension   . Gastritis and gastroduodenitis   . GI bleed   . Gout   . Heart murmur   . History of blood transfusion   . History of kidney stones   . Nephrolithiasis   . OSA (obstructive sleep apnea) 09/02/2013    IMPRESSION :  1. Mild obstructive sleep apnea with hypopneas causing sleep fragmentation and moderate oxygen desaturation.  2. Short runs of nonsustained VT were noted. His beta blocker may need to be titrated 3. Significant PLM's were noted, the PLM arousal index was low. Please correlate with clinical history of restless leg syndrome.  4. Sleep efficiency was poor.  RECOMMENDATION:  1. Treatment options for this degree of sleep disordered breathing include weight loss and positional  therapy to avoid supine sleep 2. Consider titrating beta blocker further, defer to cardiologist 3. Patient should be cautioned against driving when sleepy.They should be asked to avoid medications with sedative side effects     . Osteoarthritis of right hip   . Pneumonia    hx 30 yrs ago  . Primary osteoarthritis of right hip   . Rheumatoid arthritis ([place])   . Thrombocytopenia ([place])     Past Surgical History:  Procedure Laterality Date  . AV FISTULA PLACEMENT Left 08/26/2015   Procedure: LEFT RADIOCEPHALIC FISTULA CREATION;  Surgeon: Rosetta Posner, MD;  Location: [place];  Service: Vascular;  Laterality: Left;  . AV FISTULA PLACEMENT Left 11/23/2015   Procedure: ARTERIOVENOUS (AV) FISTULA CREATION;  Surgeon: Rosetta Posner, MD;  Location: [place];  Service: Vascular;  Laterality: Left;  . BACK SURGERY     x2- discectomy  . CHOLECYSTECTOMY  1994  . CYSTOSCOPY/RETROGRADE/URETEROSCOPY/STONE EXTRACTION WITH BASKET    . ESOPHAGOGASTRODUODENOSCOPY N/A 11/13/2015   Procedure: ESOPHAGOGASTRODUODENOSCOPY (EGD);  Surgeon: Irene Shipper, MD;  Location: [facility] ENDOSCOPY;  Service: Endoscopy;  Laterality: N/A;  . INSERTION OF DIALYSIS CATHETER Left 11/23/2015   Procedure: INSERTION OF DIALYSIS CATHETER;  Surgeon: Rosetta Posner, MD;  Location: [place];  Service: Vascular;  Laterality: Left;  . JOINT REPLACEMENT     Total L-Hip replacement, Right Knee 10/20/09  . LEFT AND RIGHT HEART CATHETERIZATION WITH CORONARY ANGIOGRAM N/A 02/22/2013   Procedure: LEFT AND RIGHT HEART CATHETERIZATION WITH  CORONARY ANGIOGRAM;  Surgeon: Jolaine Artist, MD;  Location: [facility] CATH LAB;  Service: Cardiovascular;  Laterality: N/A;  . LITHOTRIPSY  90's  . PERIPHERAL VASCULAR CATHETERIZATION Left 11/19/2015   Procedure: A/V/Fistulagram;  Surgeon: Conrad Camano, MD;  Location: [place] CV LAB;  Service: Cardiovascular;  Laterality: Left;  . REVISON OF ARTERIOVENOUS FISTULA Left 05/30/2016   Procedure: REVISION LEFT UPPER ARM FISTULA;  Surgeon:  Conrad [place], MD;  Location: [place];  Service: Vascular;  Laterality: Left;  . SPINE SURGERY     x 2  . TOTAL HIP ARTHROPLASTY Right 03/22/2016   Procedure: RIGHT TOTAL HIP ARTHROPLASTY ANTERIOR APPROACH;  Surgeon: Mcarthur Rossetti, MD;  Location: [place];  Service: Orthopedics;  Laterality: Right;    Family History  Problem Relation Age of Onset  . Hypertension Mother   . Arthritis Mother     ?RA  . Hypertension Father     Social History:  reports that he has never smoked. He has never used smokeless tobacco. He reports that he does not drink alcohol or use drugs.  Allergies:  Allergies  Allergen Reactions  . Ace Inhibitors Other (See Comments)    Worsening renal insufficiency    Medications: I have reviewed the patient's current medications. Prior to Admission:  (Not in a hospital admission)  Hectorol 2 mcg  Results for orders placed or performed during the hospital encounter of 07/16/16 (from the past 48 hour(s))  Magnesium     Status: Abnormal   Collection Time: 07/16/16  2:55 AM  Result Value Ref Range   Magnesium 2.7 (H) 1.7 - 2.4 mg/dL  Troponin I     Status: Abnormal   Collection Time: 07/16/16  2:55 AM  Result Value Ref Range   Troponin I 0.07 (HH) <0.03 ng/mL    Comment: CRITICAL RESULT CALLED TO, READ BACK BY AND VERIFIED WITH: OLSEN,E RN 07/16/2016 0400 JORDANS   Protime-INR (if pt is taking coumadin)     Status: Abnormal   Collection Time: 07/16/16  2:55 AM  Result Value Ref Range   Prothrombin Time 30.0 (H) 11.4 - 15.2 seconds   INR 2.78   APTT  (IF patient is taking Pradaxa)     Status: Abnormal   Collection Time: 07/16/16  2:55 AM  Result Value Ref Range   aPTT 44 (H) 24 - 36 seconds    Comment:        IF BASELINE aPTT IS ELEVATED, SUGGEST PATIENT RISK ASSESSMENT BE USED TO DETERMINE APPROPRIATE ANTICOAGULANT THERAPY.   CBC with Differential/Platelet     Status: Abnormal   Collection Time: 07/16/16  2:55 AM  Result Value Ref Range   WBC 9.8  4.0 - 10.5 K/uL   RBC 3.90 (L) 4.22 - 5.81 MIL/uL   Hemoglobin 12.0 (L) 13.0 - 17.0 g/dL   HCT 37.7 (L) 39.0 - 52.0 %   MCV 96.7 78.0 - 100.0 fL   MCH 30.8 26.0 - 34.0 pg   MCHC 31.8 30.0 - 36.0 g/dL   RDW 15.9 (H) 11.5 - 15.5 %   Platelets 165 150 - 400 K/uL   Neutrophils Relative % 82 %   Neutro Abs 8.1 (H) 1.7 - 7.7 K/uL   Lymphocytes Relative 10 %   Lymphs Abs 1.0 0.7 - 4.0 K/uL   Monocytes Relative 7 %   Monocytes Absolute 0.6 0.1 - 1.0 K/uL   Eosinophils Relative 1 %   Eosinophils Absolute 0.1 0.0 - 0.7 K/uL   Basophils Relative  0 %   Basophils Absolute 0.0 0.0 - 0.1 K/uL  Comprehensive metabolic panel     Status: Abnormal   Collection Time: 07/16/16  2:55 AM  Result Value Ref Range   Sodium 136 135 - 145 mmol/L   Potassium >7.5 (HH) 3.5 - 5.1 mmol/L    Comment: CRITICAL RESULT CALLED TO, READ BACK BY AND VERIFIED WITH: OLSEN,E RN 07/16/2016 0400 JORDANS NO VISIBLE HEMOLYSIS    Chloride 108 101 - 111 mmol/L   CO2 16 (L) 22 - 32 mmol/L   Glucose, Bld 139 (H) 65 - 99 mg/dL   BUN 67 (H) 6 - 20 mg/dL   Creatinine, Ser 9.90 (H) 0.61 - 1.24 mg/dL   Calcium 9.2 8.9 - 10.3 mg/dL   Total Protein 7.9 6.5 - 8.1 g/dL   Albumin 3.7 3.5 - 5.0 g/dL   AST 28 15 - 41 U/L   ALT 17 17 - 63 U/L   Alkaline Phosphatase 137 (H) 38 - 126 U/L   Total Bilirubin 0.6 0.3 - 1.2 mg/dL   GFR calc non Af Amer 5 (L) >60 mL/min   GFR calc Af Amer 5 (L) >60 mL/min    Comment: (NOTE) The eGFR has been calculated using the CKD EPI equation. This calculation has not been validated in all clinical situations. eGFR's persistently <60 mL/min signify possible Chronic Kidney Disease.    Anion gap 12 5 - 15    No results found.  ROS Blood pressure 128/63, pulse (!) 56, temperature 98.2 F (36.8 C), temperature source Oral, resp. rate (!) 33, height '6\' 3"'  (1.905 m), weight 106.6 kg (235 lb), SpO2 97 %. Physical Exam Physical Examination: General appearance - SOB., mod distress Mental status -  alert, oriented to person, place, and time Eyes - pupils equal and reactive, extraocular eye movements intact, funduscopic exam normal, discs flat and sharp Ears - bilateral TM's and external ear canals normal Nose - normal and patent, no erythema, discharge or polyps Mouth - mucous membranes moist, pharynx normal without lesions Neck - adenopathy noted PCL Lymphatics - posterior cervical nodes Chest - rales to mid lungs Heart - slow 50s, irreg Gr 2/6 holosys M Abdomen - obese, pos bs, liver down 5 cm Extremities - pedal edema 1 +, aVF LUA, 1 + edema Skin - dry , scaling  Assessment/Plan: 1 Hyperkalemia due to diet issues and ? recirc with sticks too close 2 ESRD: vol xs , ^ k 3 Hypertension: not an issue 4. Anemia of ESRD: Hb ok, needs Fe 5. Metabolic Bone Disease: use vit D P Hd, with O K, follow K r/o endogenous source, diet instruction,    Sanford Lindblad L 07/16/2016, 4:14 AM

## 2016-07-16 NOTE — Progress Notes (Signed)
Pt pending to ICU bed. Dr. Jonnie Finner paged to reevaluate need for ICU after receiving HD treatment. Dr. Halford Chessman with CCM, pt's primary care team, asked to reevaluate and agrees. CCM to place orders for appropriate bed placement.

## 2016-07-16 NOTE — ED Provider Notes (Signed)
Central DEPT Provider Note   CSN: [id number] Arrival date & time: 07/16/16  0234  By signing my name below, I, Bruce Cole, attest that this documentation has been prepared under the direction and in the presence of Bruce Biles, MD. Electronically Signed: Judithe Cole, ER Scribe. 02/27/2016. 2:59 AM.  History   Chief Complaint Chief Complaint  Patient presents with  . Weakness   The history is provided by the patient. No language interpreter was used.    HPI Comments: Bruce Cole is a 75 y.o. male who presents to the Emergency Department complaining of sudden onset weakness and SOB since 6pm yesterday. He has had two weeks of constant diarrhea (more than 10 events per day, light in color). He has taken abx in the past month which were prescribed by a dentist for a tooth surgery. He was last dialyzed yesterday. He fell around six pm. He denies CP, nausea, vomiting, abdominal pain, syncope or presyncope, He has a PMHx af atrial fib. He still makes a small amount of urine daily.    Past Medical History:  Diagnosis Date  . Acute blood loss anemia   . Atrial fibrillation (HCC)    Chronic  . Chronic combined systolic and diastolic heart failure ([place])   . Colon polyp 2000  . Dysrhythmia    hx  . ESRD on hemodialysis ([place])    [place]  . Essential hypertension   . Gastritis and gastroduodenitis   . GI bleed   . Gout   . Heart murmur   . History of blood transfusion   . History of kidney stones   . Nephrolithiasis   . OSA (obstructive sleep apnea) 09/02/2013    IMPRESSION :  1. Mild obstructive sleep apnea with hypopneas causing sleep fragmentation and moderate oxygen desaturation.  2. Short runs of nonsustained VT were noted. His beta blocker may need to be titrated 3. Significant PLM's were noted, the PLM arousal index was low. Please correlate with clinical history of restless leg syndrome.  4. Sleep efficiency was poor.  RECOMMENDATION:  1.  Treatment options for this degree of sleep disordered breathing include weight loss and positional therapy to avoid supine sleep 2. Consider titrating beta blocker further, defer to cardiologist 3. Patient should be cautioned against driving when sleepy.They should be asked to avoid medications with sedative side effects     . Osteoarthritis of right hip   . Pneumonia    hx 30 yrs ago  . Primary osteoarthritis of right hip   . Rheumatoid arthritis ([place])   . Thrombocytopenia [facility])     Patient Active Problem List   Diagnosis Date Noted  . Aftercare following surgery of the circulatory system 06/17/2016  . Status post total replacement of right hip 03/22/2016  . ESRD on dialysis ([place]) 11/27/2015  . Encounter for therapeutic drug monitoring 09/02/2013  . OSA (obstructive sleep apnea) 09/02/2013  . Chronic combined systolic and diastolic heart failure ([place]) 03/16/2013  . Allergic rhinitis 12/18/2012  . Long term (current) use of anticoagulants 11/25/2010  . Genital herpes 05/31/2010  . DM (diabetes mellitus), type 2 with renal complications ([place]) 29/56/2130  . ERECTILE DYSFUNCTION, ORGANIC 09/21/2009  . Osteoarthritis 09/21/2009  . PERSONAL HX COLONIC POLYPS 08/25/2009  . Gout 07/22/2009  . Essential hypertension 07/22/2009  . ATRIAL FIBRILLATION 07/22/2009  . Rheumatoid arthritis ([place]) 07/22/2009  . NEPHROLITHIASIS, HX OF 07/22/2009    Past Surgical History:  Procedure Laterality Date  . AV FISTULA  PLACEMENT Left 08/26/2015   Procedure: LEFT RADIOCEPHALIC FISTULA CREATION;  Surgeon: Rosetta Posner, MD;  Location: [place];  Service: Vascular;  Laterality: Left;  . AV FISTULA PLACEMENT Left 11/23/2015   Procedure: ARTERIOVENOUS (AV) FISTULA CREATION;  Surgeon: Rosetta Posner, MD;  Location: [place];  Service: Vascular;  Laterality: Left;  . BACK SURGERY     x2- discectomy  . CHOLECYSTECTOMY  1994  . CYSTOSCOPY/RETROGRADE/URETEROSCOPY/STONE EXTRACTION WITH BASKET    .  ESOPHAGOGASTRODUODENOSCOPY N/A 11/13/2015   Procedure: ESOPHAGOGASTRODUODENOSCOPY (EGD);  Surgeon: Irene Shipper, MD;  Location: [facility] ENDOSCOPY;  Service: Endoscopy;  Laterality: N/A;  . INSERTION OF DIALYSIS CATHETER Left 11/23/2015   Procedure: INSERTION OF DIALYSIS CATHETER;  Surgeon: Rosetta Posner, MD;  Location: [place];  Service: Vascular;  Laterality: Left;  . JOINT REPLACEMENT     Total L-Hip replacement, Right Knee 10/20/09  . LEFT AND RIGHT HEART CATHETERIZATION WITH CORONARY ANGIOGRAM N/A 02/22/2013   Procedure: LEFT AND RIGHT HEART CATHETERIZATION WITH CORONARY ANGIOGRAM;  Surgeon: Jolaine Artist, MD;  Location: [facility] CATH LAB;  Service: Cardiovascular;  Laterality: N/A;  . LITHOTRIPSY  90's  . PERIPHERAL VASCULAR CATHETERIZATION Left 11/19/2015   Procedure: A/V/Fistulagram;  Surgeon: Conrad [place], MD;  Location: [place] CV LAB;  Service: Cardiovascular;  Laterality: Left;  . REVISON OF ARTERIOVENOUS FISTULA Left 05/30/2016   Procedure: REVISION LEFT UPPER ARM FISTULA;  Surgeon: Conrad Ranlo, MD;  Location: [place];  Service: Vascular;  Laterality: Left;  . SPINE SURGERY     x 2  . TOTAL HIP ARTHROPLASTY Right 03/22/2016   Procedure: RIGHT TOTAL HIP ARTHROPLASTY ANTERIOR APPROACH;  Surgeon: Mcarthur Rossetti, MD;  Location: [place];  Service: Orthopedics;  Laterality: Right;       Home Medications    Prior to Admission medications   Medication Sig Start Date End Date Taking? Authorizing Provider  acetaminophen (TYLENOL) 500 MG tablet Take 500 mg by mouth every 6 (six) hours as needed for mild pain.   Yes Historical Provider, MD  albuterol (PROVENTIL) (2.5 MG/3ML) 0.083% nebulizer solution Take 2.5 mg by nebulization every 4 (four) hours as needed for shortness of breath. Reported on 09/25/2015   Yes Historical Provider, MD  allopurinol (ZYLOPRIM) 100 MG tablet Take 2 tablets (200 mg total) by mouth daily. 11/25/15  Yes Robbie Lis, MD  B Complex-C-Folic Acid (RENA-VITE RX) 1 MG TABS  Take 1 tablet by mouth at bedtime.  02/18/16  Yes Historical Provider, MD  fluticasone (FLONASE) 50 MCG/ACT nasal spray Place 2 sprays into both nostrils daily. Patient taking differently: Place 2 sprays into both nostrils daily as needed for allergies.  03/18/15  Yes Debbrah Alar, NP  furosemide (LASIX) 80 MG tablet Take 80mg s daily on  Mondays, Wednesdays, Fridays, and Sundays 05/03/16  Yes Historical Provider, MD  gabapentin (NEURONTIN) 300 MG capsule Take 1 capsule (300 mg total) by mouth at bedtime. 06/08/16  Yes Mcarthur Rossetti, MD  HYDROcodone-acetaminophen (NORCO) 5-325 MG tablet Take 1 tablet by mouth every 6 (six) hours as needed for moderate pain. One to two tabs every 4-6 hours for pain 05/30/16  Yes Ulyses Amor, PA-C  hydroxychloroquine (PLAQUENIL) 200 MG tablet Take 200 mg by mouth 2 (two) times daily. Reported on 01/13/2016 09/11/14  Yes Bo Merino, MD  pantoprazole (PROTONIX) 40 MG tablet Take 1 tablet (40 mg total) by mouth 2 (two) times daily. Patient taking differently: Take 40 mg by mouth 2 (two) times daily as  needed (indigestion).  11/25/15  Yes Robbie Lis, MD  polyethylene glycol [facility] Floria Raveling) packet Take 17 g by mouth daily as needed for mild constipation.    Yes Historical Provider, MD  RENVELA 800 MG tablet Take 800 mg by mouth 3 (three) times daily with meals.  01/23/16  Yes Historical Provider, MD  warfarin (COUMADIN) 7.5 MG tablet Take 7.5 mg by mouth daily.   Yes Historical Provider, MD  warfarin (COUMADIN) 7.5 MG tablet TAKE 1 TABLET BY MOUTH DAILY AS DIRECTED BY COUMADIN CLINIC Patient not taking: Reported on 07/16/2016 06/17/16   Evans Lance, MD    Family History Family History  Problem Relation Age of Onset  . Hypertension Mother   . Arthritis Mother     ?RA  . Hypertension Father     Social History Social History  Substance Use Topics  . Smoking status: Never Smoker  . Smokeless tobacco: Never Used  . Alcohol use No      Comment: occasional     Allergies   Ace inhibitors   Review of Systems Review of Systems  Constitutional: Negative for chills and fever.  Gastrointestinal: Positive for diarrhea. Negative for nausea and vomiting.  A complete 10 system review of systems was obtained and all systems are negative except as noted in the HPI and PMH.   Physical Exam Updated Vital Signs BP 128/64   Pulse (!) 55   Temp 98.2 F (36.8 C) (Oral)   Resp (!) 34   Ht 6\' 3"  (1.905 m)   Wt 235 lb (106.6 kg)   SpO2 94%   BMI 29.37 kg/m   Physical Exam  Constitutional: He is oriented to person, place, and time. He appears well-developed and well-nourished. No distress.  HENT:  Head: Normocephalic and atraumatic.  Eyes: Pupils are equal, round, and reactive to light.  Neck: Neck supple.  Cardiovascular: Normal rate.   Pulmonary/Chest: Effort normal. No respiratory distress.  Musculoskeletal: Normal range of motion.  Neurological: He is alert and oriented to person, place, and time. Coordination normal.  Skin: Skin is warm and dry. He is not diaphoretic.  Psychiatric: He has a normal mood and affect. His behavior is normal.  Nursing note and vitals reviewed.    ED Treatments / Results  DIAGNOSTIC STUDIES: Oxygen Saturation is 96% on RA, adequate by my interpretation.    COORDINATION OF CARE:  2:58 AM Discussed treatment plan with pt at bedside and pt agreed to plan.  Labs (all labs ordered are listed, but only abnormal results are displayed) Labs Reviewed  MAGNESIUM - Abnormal; Notable for the following:       Result Value   Magnesium 2.7 (*)    All other components within normal limits  TROPONIN I - Abnormal; Notable for the following:    Troponin I 0.07 (*)    All other components within normal limits  PROTIME-INR - Abnormal; Notable for the following:    Prothrombin Time 30.0 (*)    All other components within normal limits  APTT - Abnormal; Notable for the following:    aPTT 44 (*)      All other components within normal limits  CBC WITH DIFFERENTIAL/PLATELET - Abnormal; Notable for the following:    RBC 3.90 (*)    Hemoglobin 12.0 (*)    HCT 37.7 (*)    RDW 15.9 (*)    Neutro Abs 8.1 (*)    All other components within normal limits  COMPREHENSIVE METABOLIC PANEL - Abnormal;  Notable for the following:    Potassium >7.5 (*)    CO2 16 (*)    Glucose, Bld 139 (*)    BUN 67 (*)    Creatinine, Ser 9.90 (*)    Alkaline Phosphatase 137 (*)    GFR calc non Af Amer 5 (*)    GFR calc Af Amer 5 (*)    All other components within normal limits  CBG MONITORING, ED - Abnormal; Notable for the following:    Glucose-Capillary 148 (*)    All other components within normal limits  RENAL FUNCTION PANEL  I-STAT CHEM 8, ED    EKG  EKG Interpretation  Date/Time:  Saturday July 16 2016 02:41:12 EST Ventricular Rate:  60-70 PR Interval:    QRS Duration: 216 QT Interval:  481 QTC Calculation: 1065 R Axis:   -108 Text Interpretation:  Age not entered, assumed to be  75 years old for purpose of ECG interpretation Wide complex rhythm Indeterminate rhythm Bradycardia   new change since prior ekg Confirmed by Kathrynn Humble, MD, Thelma Comp [id number]) on 07/16/2016 3:51:41 AM       Radiology Dg Chest Port 1 View  Result Date: 07/16/2016 CLINICAL DATA:  Weakness, dyspnea, dialysis patient. EXAM: PORTABLE CHEST 1 VIEW COMPARISON:  None. FINDINGS: Cardiomegaly with external defibrillator paddle projecting over the left hemithorax. There is interstitial edema. No pneumothorax nor definite effusion. Aortic atherosclerosis. Right costophrenic angle is excluded on this study. IMPRESSION: Cardiomegaly with interstitial pulmonary edema. Electronically Signed   By: Ashley Royalty M.D.   On: 07/16/2016 04:23    Procedures .Critical Care Performed by: Bruce Cole Authorized by: Bruce Cole   Critical care provider statement:    Critical care time (minutes):  50   Critical care time was  exclusive of:  Separately billable procedures and treating other patients   Critical care was necessary to treat or prevent imminent or life-threatening deterioration of the following conditions:  Metabolic crisis, circulatory failure, cardiac failure and renal failure   Critical care was time spent personally by me on the following activities:  Blood draw for specimens, development of treatment plan with patient or surrogate, discussions with consultants, examination of patient, interpretation of cardiac output measurements, obtaining history from patient or surrogate, ordering and performing treatments and interventions, ordering and review of laboratory studies, ordering and review of radiographic studies, pulse oximetry, re-evaluation of patient's condition and review of old charts   (including critical care time)  Medications Ordered in ED Medications  calcium gluconate 1 g in sodium chloride 0.9 % 100 mL IVPB (not administered)  sodium bicarbonate injection 50 mEq (not administered)  furosemide (LASIX) injection 80 mg (not administered)  doxercalciferol (HECTOROL) injection 2 mcg (not administered)  allopurinol (ZYLOPRIM) tablet 100 mg (not administered)  multivitamin (RENA-VIT) tablet 1 tablet (not administered)  sevelamer carbonate (RENVELA) tablet 2,400 mg (not administered)  hydroxychloroquine (PLAQUENIL) tablet 200 mg (not administered)  calcium gluconate 1 g in sodium chloride 0.9 % 100 mL IVPB (0 g Intravenous Stopped 07/16/16 0420)  insulin aspart (novoLOG) injection 6 Units (6 Units Intravenous Given 07/16/16 0319)  sodium bicarbonate injection 50 mEq (50 mEq Intravenous Given 07/16/16 0319)  dextrose 50 % solution 50 mL (50 mLs Intravenous Given 07/16/16 0319)     Initial Impression / Assessment and Plan / ED Course  I have reviewed the triage vital signs and the nursing notes.  Pertinent labs & imaging results that were available during my care of the patient were reviewed  by me  and considered in my medical decision making (see chart for details).  Clinical Course as of Jul 16 440  Sat Jul 16, 2016  0317 K is > 8 per [employer]. Nephrology consulted. Rest of the hyperK meds ordered to temporarily stabilize pt. BP is stable. Pt is alert and oriented. Cardiac pads already placed on the chest.  [AN]  0438 CCM to admit. Nephrology has seen. 2nd round of bicarb and calcium ordered.  HR improved to 80s. VSS and WNL.  [AN]    Clinical Course User Index [AN] Bruce Biles, MD    Pt comes in with cc of weakness. He is noted to have abnormal heart rhythm. Initial impression is hyperkalemia. Pt however reports that he has been UTD with his HD and also c/o diarrhea. We ordered Calcium Gluconate still given the high suspicion for hyperK.  Pt is having weakness. His exam is non focal. I suspect his symptoms are related to abnormal e'lytes. No chest pain/dib. Basic labs ordered. CBG ordered.   Final Clinical Impressions(s) / ED Diagnoses   Final diagnoses:  Acute hyperkalemia    New Prescriptions New Prescriptions   No medications on file     I personally performed the services described in this documentation, which was scribed in my presence. The recorded information has been reviewed and is accurate.        Bruce Biles, MD 07/16/16 [phone]

## 2016-07-16 NOTE — ED Notes (Signed)
EDP at bedside  

## 2016-07-16 NOTE — ED Triage Notes (Signed)
BIB EMS from home, reports diarrhea X2 weeks, weakness started today, pt reports falling several times today. Also endorses SOB. Dialysis pt, has not missed any, scheduled for tomorrow. HR low 50's. 2L [place] for comfort.

## 2016-07-16 NOTE — Progress Notes (Signed)
eLink Physician-Brief Progress Note Patient Name: ABDULAI BLAYLOCK DOB: Mar 29, 1941 MRN: [id number]   Date of Service  07/16/2016  HPI/Events of Note  Patient on PO diet. Currently on Q 4 hour coverage.   eICU Interventions  Will order:  1. Change Q 4 hour Novolog SSI to AC/HS sensitive Novolog SSI.         [name],[name] 07/16/2016, 8:10 PM

## 2016-07-16 NOTE — H&P (Signed)
PULMONARY / CRITICAL CARE MEDICINE   Name: Bruce Cole MRN: [id number] DOB: 01-16-1941    ADMISSION DATE:  07/16/2016 CONSULTATION DATE:    REFERRING MD:  EDP  CHIEF COMPLAINT:  Weakness, numbness, inability to walk, high potassium  HISTORY OF PRESENT ILLNESS:   [name] is a 80M with PMH significant for a fib on chronic a/c with warfarin, chronic combined heart failure, ESRD on iHD TuThSa, GERD/gastritis, HTN, DMII, osteoarthritis with recent THA, presents to ED via EMS from home with complaints of loose stool, weakness, gait instability and shortness of breath. HR by EMS was int he low 50s. On arrival to the ED,  HR low 50s, adequate BP, O2 sats OK, afebrile. Initial labs showed K > 7.5, HCO3 16, Cr 9.9 BUN 67. CBC notable only for mild chronic anemia with Hgb 12.   He reports a normal dialysis session on Thursday. He has been having loose stools - copious watery, >10x per day - for the past several days. He thinks his grandson may have brought a stomach virus home from college. He denies abdominal pain. No bloody diarrhea. No associated fever / chills / nausea / vomiting. He has been trying to stay hydrated and has been eating more pineapple than usual. He endorses shortness of breath associated with mild cough with minimal phlegm. No hemoptysis or green/yellow sputum. No chest pain or palpitations. He just feels weak all over and has fallen a few times but without injury and never hit his head. No urinary symptoms.   PAST MEDICAL HISTORY :  He  has a past medical history of Acute blood loss anemia; Atrial fibrillation ([place]); Chronic combined systolic and diastolic heart failure ([place]); Colon polyp (2000); Dysrhythmia; ESRD on hemodialysis ([place]); Essential hypertension; Gastritis and gastroduodenitis; GI bleed; Gout; Heart murmur; History of blood transfusion; History of kidney stones; Nephrolithiasis; OSA (obstructive sleep apnea) (09/02/2013); Osteoarthritis of right hip; Pneumonia;  Primary osteoarthritis of right hip; Rheumatoid arthritis ([place]); and Thrombocytopenia ([place]).  PAST SURGICAL HISTORY: He  has a past surgical history that includes Spine surgery; Joint replacement; Cholecystectomy (1994); left and right heart catheterization with coronary angiogram (N/A, 02/22/2013); AV fistula placement (Left, 08/26/2015); Esophagogastroduodenoscopy (N/A, 11/13/2015); Cardiac catheterization (Left, 11/19/2015); AV fistula placement (Left, 11/23/2015); Insertion of dialysis catheter (Left, 11/23/2015); Lithotripsy (90's); Cystoscopy/retrograde/ureteroscopy/stone extraction with basket; Total hip arthroplasty (Right, 03/22/2016); Back surgery; and [facility] of arteriovenous fistula (Left, 05/30/2016).  Allergies  Allergen Reactions  . Ace Inhibitors Other (See Comments)    Worsening renal insufficiency    No current facility-administered medications on file prior to encounter.    Current Outpatient Prescriptions on File Prior to Encounter  Medication Sig  . acetaminophen (TYLENOL) 500 MG tablet Take 500 mg by mouth every 6 (six) hours as needed for mild pain.  [name] albuterol (PROVENTIL) (2.5 MG/3ML) 0.083% nebulizer solution Take 2.5 mg by nebulization every 4 (four) hours as needed for shortness of breath. Reported on 09/25/2015  . allopurinol (ZYLOPRIM) 100 MG tablet Take 2 tablets (200 mg total) by mouth daily.  . B Complex-C-Folic Acid (RENA-VITE RX) 1 MG TABS Take 1 tablet by mouth at bedtime.   . fluticasone (FLONASE) 50 MCG/ACT nasal spray Place 2 sprays into both nostrils daily. (Patient taking differently: Place 2 sprays into both nostrils daily as needed for allergies. )  . furosemide (LASIX) 80 MG tablet Take 80mg s daily on  Mondays, Wednesdays, Fridays, and Sundays  . gabapentin (NEURONTIN) 300 MG capsule Take 1 capsule (300 mg total) by mouth  at bedtime.  [name] HYDROcodone-acetaminophen (NORCO) 5-325 MG tablet Take 1 tablet by mouth every 6 (six) hours as needed for moderate pain. One to  two tabs every 4-6 hours for pain  . hydroxychloroquine (PLAQUENIL) 200 MG tablet Take 200 mg by mouth 2 (two) times daily. Reported on 01/13/2016  . pantoprazole (PROTONIX) 40 MG tablet Take 1 tablet (40 mg total) by mouth 2 (two) times daily. (Patient taking differently: Take 40 mg by mouth 2 (two) times daily as needed (indigestion). )  . polyethylene glycol (MIRALAX / GLYCOLAX) packet Take 17 g by mouth daily as needed for mild constipation.   [name] RENVELA 800 MG tablet Take 800 mg by mouth 3 (three) times daily with meals.   . warfarin (COUMADIN) 7.5 MG tablet TAKE 1 TABLET BY MOUTH DAILY AS DIRECTED BY COUMADIN CLINIC (Patient not taking: Reported on 07/16/2016)    FAMILY HISTORY:  His indicated that the status of his mother is unknown. He indicated that the status of his father is unknown. He indicated that his sister is deceased. He indicated that only one of his two brothers is alive. He indicated that both of his daughters are alive. He indicated that his son is alive.   SOCIAL HISTORY: He  reports that he has never smoked. He has never used smokeless tobacco. He reports that he does not drink alcohol or use drugs.   REVIEW OF SYSTEMS:   General: no weight change, no fever, no chills Cardiovascular: no chest pain, palpitations, orthopnea, or PND Respiratory: see HPI Gastrointestinal: see HPI Genitourinary: no pain with urination, no foul odor, no frequency, no urgency Musculoskeletal: no new joint pain or swelling, no muscle aches Skin: no rashes, sores, or ulcers Endocrine: hx diabetes, no thyroid problems Neurologic: no focal changes; some sensations of numbness and dizziness over the last 24h Hematologic: no bleeding, bruising, or clotting problems, but on chronic warfarin Psychiatric: no depression or anxiety, no suicidal ideation or intent  SUBJECTIVE:    VITAL SIGNS: BP 128/64   Pulse (!) 55   Temp 98.2 F (36.8 C) (Oral)   Resp (!) 34   Ht 6\' 3"  (1.905 m)   Wt 106.6  kg (235 lb)   SpO2 94%   BMI 29.37 kg/m   HEMODYNAMICS:    VENTILATOR SETTINGS:    INTAKE / OUTPUT: No intake/output data recorded.  PHYSICAL EXAMINATION:  General Well nourished, well developed, no apparent distress  HEENT No gross abnormalities. Oropharynx clear.   Pulmonary Diminished, but clear to auscultation bilaterally with no wheezes, rales or ronchi on anterior exam. Good effort, symmetrical expansion.   Cardiovascular Bradycardic in the 50s. S1, s2. No m/r/g. Distal pulses palpable. AVF LUE accessed for iHD, so not palpated  Abdomen Soft, non-tender, non-distended, positive bowel sounds, no palpable organomegaly or masses. Normoresonant to percussion.  Musculoskeletal Grossly normal  Lymphatics No cervical, supraclavicular or axillary adenopathy.   Neurologic Grossly intact. No focal deficits.   Skin/Integuement No rash, no cyanosis, no clubbing. Trace bipedal edema   LABS:  BMET  Recent Labs Lab 07/16/16 0255  NA 136  K >7.5*  CL 108  CO2 16*  BUN 67*  CREATININE 9.90*  GLUCOSE 139*    Electrolytes  Recent Labs Lab 07/16/16 0255  CALCIUM 9.2  MG 2.7*    CBC  Recent Labs Lab 07/16/16 0255  WBC 9.8  HGB 12.0*  HCT 37.7*  PLT 165    Coag's  Recent Labs Lab 07/13/16 0922 07/16/16 0255  APTT  --  44*  INR 1.7 2.78    Sepsis Markers No results for input(s): LATICACIDVEN, PROCALCITON, O2SATVEN in the last 168 hours.  ABG No results for input(s): PHART, PCO2ART, PO2ART in the last 168 hours.  Liver Enzymes  Recent Labs Lab 07/16/16 0255  AST 28  ALT 17  ALKPHOS 137*  BILITOT 0.6  ALBUMIN 3.7    Cardiac Enzymes  Recent Labs Lab 07/16/16 0255  TROPONINI 0.07*    Glucose  Recent Labs Lab 07/16/16 0416  GLUCAP 148*    Imaging Dg Chest Port 1 View  Result Date: 07/16/2016 CLINICAL DATA:  Weakness, dyspnea, dialysis patient. EXAM: PORTABLE CHEST 1 VIEW COMPARISON:  None. FINDINGS: Cardiomegaly with external  defibrillator paddle projecting over the left hemithorax. There is interstitial edema. No pneumothorax nor definite effusion. Aortic atherosclerosis. Right costophrenic angle is excluded on this study. IMPRESSION: Cardiomegaly with interstitial pulmonary edema. Electronically Signed   By: Ashley Royalty M.D.   On: 07/16/2016 04:23   STUDIES:   CULTURES: none  ANTIBIOTICS: None  SIGNIFICANT EVENTS:   LINES/TUBES: PIV  DISCUSSION: Mr. Bachmeier is a 47M with PMH of ESRD on iHD TTSa who presents with symptomatic and critical hyperkalemia. He has been temporized x 2 with minimal improvement and requires emergent hemodialysis.  It appears his hyperkalemia is most likely due to dietary indiscretion and recent watery stools affecting fluid balance.  ASSESSMENT / PLAN: RENAL A:   Acute hyperkalemia with bradycardia and ECG changes ESRD on TTSa iHD  Metabolic acidosis P:   Urgent iHD Nephrology following  Counseled on high potassium foods BMP q 6h x 2  PULMONARY A: No acute issues P:   Supplemental O2 if needed to maintain sats > 88%  CARDIOVASCULAR A:  Bradycardia Hx chronic a fib on warfarin P:  Correct underlying hyperkalemia Continue anticoagulation  GASTROINTESTINAL A:   Watery diarrhea x several days P:   Check c-diff for completeness sake  HEMATOLOGIC A:   Chronic anemia - stable P:  Trend CBC  INFECTIOUS A:   Doubt bacterial infection; possible viral gastroenteritis P:   Check c diff for completeness sake No antibiotics at this time  ENDOCRINE A:   Hx diabetes P:   CBGs and SSI  NEUROLOGIC A:   No acute issues P:   RASS goal: 0   FAMILY  - Updates: patient at bedside. He reports that he does not want to be on life support - specifically a ventilator, but would want cardiac resuscitation. I explained that part of cardiac resuscitation is obtaining a secure airway. He desires to remain FULL CODE.   - Inter-disciplinary family meet or Palliative  Care meeting due by:  day 7  The patient is critically ill with multiple organ system failure and requires high complexity decision making for assessment and support, frequent evaluation and titration of therapies, advanced monitoring, review of radiographic studies and interpretation of complex data.   Critical Care Time devoted to patient care services, exclusive of separately billable procedures, described in this note is 39 minutes.   Yisroel Ramming, MD Pulmonary and [place] Pager: [phone]  07/16/2016, 5:00 AM

## 2016-07-16 NOTE — Procedures (Signed)
I was present at this session.  I have reviewed the session itself and made appropriate changes.  HD via LUA avf.  bp ^ to start. 0 K  Bruce Cole L 12/30/20175:07 AM

## 2016-07-17 DIAGNOSIS — E875 Hyperkalemia: Secondary | ICD-10-CM

## 2016-07-17 DIAGNOSIS — Z992 Dependence on renal dialysis: Secondary | ICD-10-CM | POA: Diagnosis not present

## 2016-07-17 DIAGNOSIS — N186 End stage renal disease: Secondary | ICD-10-CM | POA: Diagnosis not present

## 2016-07-17 DIAGNOSIS — I158 Other secondary hypertension: Secondary | ICD-10-CM | POA: Diagnosis not present

## 2016-07-17 LAB — CBC
HCT: 34.1 % — ABNORMAL LOW (ref 39.0–52.0)
Hemoglobin: 10.6 g/dL — ABNORMAL LOW (ref 13.0–17.0)
MCH: 29.6 pg (ref 26.0–34.0)
MCHC: 31.1 g/dL (ref 30.0–36.0)
MCV: 95.3 fL (ref 78.0–100.0)
PLATELETS: 148 10*3/uL — AB (ref 150–400)
RBC: 3.58 MIL/uL — AB (ref 4.22–5.81)
RDW: 15.7 % — AB (ref 11.5–15.5)
WBC: 7.9 10*3/uL (ref 4.0–10.5)

## 2016-07-17 LAB — GLUCOSE, CAPILLARY
GLUCOSE-CAPILLARY: 98 mg/dL (ref 65–99)
GLUCOSE-CAPILLARY: 100 mg/dL — AB (ref 65–99)
Glucose-Capillary: 89 mg/dL (ref 65–99)

## 2016-07-17 LAB — BASIC METABOLIC PANEL
Anion gap: 15 (ref 5–15)
Anion gap: 10 (ref 5–15)
Anion gap: 13 (ref 5–15)
BUN: 38 mg/dL — AB (ref 6–20)
BUN: 23 mg/dL — ABNORMAL HIGH (ref 6–20)
BUN: 24 mg/dL — ABNORMAL HIGH (ref 6–20)
CALCIUM: 9.3 mg/dL (ref 8.9–10.3)
CO2: 24 mmol/L (ref 22–32)
CO2: 28 mmol/L (ref 22–32)
CO2: 27 mmol/L (ref 22–32)
Calcium: 9.1 mg/dL (ref 8.9–10.3)
Calcium: 9.2 mg/dL (ref 8.9–10.3)
Chloride: 97 mmol/L — ABNORMAL LOW (ref 101–111)
Chloride: 94 mmol/L — ABNORMAL LOW (ref 101–111)
Chloride: 94 mmol/L — ABNORMAL LOW (ref 101–111)
Creatinine, Ser: 7.99 mg/dL — ABNORMAL HIGH (ref 0.61–1.24)
Creatinine, Ser: 5.28 mg/dL — ABNORMAL HIGH (ref 0.61–1.24)
Creatinine, Ser: 6.03 mg/dL — ABNORMAL HIGH (ref 0.61–1.24)
GFR calc Af Amer: 7 mL/min — ABNORMAL LOW (ref >60)
GFR calc Af Amer: 11 mL/min — ABNORMAL LOW (ref >60)
GFR calc Af Amer: 10 mL/min — ABNORMAL LOW (ref >60)
GFR calc non Af Amer: 10 mL/min — ABNORMAL LOW (ref >60)
GFR calc non Af Amer: 8 mL/min — ABNORMAL LOW (ref >60)
GFR, EST NON AFRICAN AMERICAN: 6 mL/min — AB (ref >60)
GLUCOSE: 95 mg/dL (ref 65–99)
Glucose, Bld: 126 mg/dL — ABNORMAL HIGH (ref 65–99)
Glucose, Bld: 139 mg/dL — ABNORMAL HIGH (ref 65–99)
Potassium: 6.9 mmol/L (ref 3.5–5.1)
Potassium: 4.6 mmol/L (ref 3.5–5.1)
Potassium: 5 mmol/L (ref 3.5–5.1)
Sodium: 136 mmol/L (ref 135–145)
Sodium: 132 mmol/L — ABNORMAL LOW (ref 135–145)
Sodium: 134 mmol/L — ABNORMAL LOW (ref 135–145)

## 2016-07-17 LAB — PROTIME-INR
INR: 2.12
PROTHROMBIN TIME: 24.1 s — AB (ref 11.4–15.2)

## 2016-07-17 LAB — C DIFFICILE QUICK SCREEN W PCR REFLEX
C DIFFICILE (CDIFF) TOXIN: NEGATIVE
C DIFFICLE (CDIFF) ANTIGEN: NEGATIVE
C Diff interpretation: NOT DETECTED

## 2016-07-17 LAB — MAGNESIUM: MAGNESIUM: 2.4 mg/dL (ref 1.7–2.4)

## 2016-07-17 LAB — PHOSPHORUS: Phosphorus: 6 mg/dL — ABNORMAL HIGH (ref 2.5–4.6)

## 2016-07-17 NOTE — Progress Notes (Signed)
Plan for fistulogram on Tuesday to evaluate fistula to see what our options are regarding salvage vs new access.  He will need to have his K less than 6.  Annamarie Major

## 2016-07-17 NOTE — Progress Notes (Signed)
PULMONARY / CRITICAL CARE MEDICINE   Name: Bruce Cole MRN: [id number] DOB: 21-May-1941    ADMISSION DATE:  07/16/2016 CONSULTATION DATE:    REFERRING MD:  EDP  CHIEF COMPLAINT:  Weakness, numbness, inability to walk, high potassium  HISTORY OF PRESENT ILLNESS:   Bruce Cole is a 82M with PMH significant for a fib on chronic a/c with warfarin, chronic combined heart failure, ESRD on iHD TuThSa, GERD/gastritis, HTN, DMII, osteoarthritis with recent THA, presents to ED via EMS from home with complaints of loose stool, weakness, gait instability and shortness of breath. HR by EMS was int he low 50s. On arrival to the ED,  HR low 50s, adequate BP, O2 sats OK, afebrile. Initial labs showed K > 7.5, HCO3 16, Cr 9.9 BUN 67. CBC notable only for mild chronic anemia with Hgb 12.   SUBJECTIVE:  Feels much better  VITAL SIGNS: BP (!) 88/60 (BP Location: Right Arm)   Pulse 84   Temp 98.4 F (36.9 C) (Oral)   Resp 18   Ht 6\' 3"  (1.905 m)   Wt 246 lb 7.6 oz (111.8 kg)   SpO2 100%   BMI 30.81 kg/m   HEMODYNAMICS:    VENTILATOR SETTINGS:    INTAKE / OUTPUT: I/O last 3 completed shifts: In: 230 [P.O.:120; IV Piggyback:110] Out: 4000 [Other:4000]  PHYSICAL EXAMINATION:  General Well nourished, well developed, no apparent distress  HEENT No gross abnormalities. Oropharynx clear.   Pulmonary Clear and w/out accessory use   Cardiovascular Bradycardic in the 50s. S1, s2. No m/r/g. Distal pulses palpable. AVF LUE   Abdomen Soft, non-tender, non-distended, positive bowel sounds, no palpable organomegaly or masses. Normoresonant to percussion.  Musculoskeletal Grossly normal  Lymphatics No cervical, supraclavicular or axillary adenopathy.   Neurologic Grossly intact. No focal deficits.   Skin/Integuement No rash, no cyanosis, no clubbing. Trace bipedal edema   LABS:  BMET  Recent Labs Lab 07/16/16 1157 07/17/16 0536 07/17/16 1436  NA 137 136 132*  K 5.3* 6.9* 4.6  CL 97*  97* 94*  CO2 28 24 28   BUN 25* 38* 23*  CREATININE 5.83* 7.99* 5.28*  GLUCOSE 134* 95 126*    Electrolytes  Recent Labs Lab 07/16/16 0255 07/16/16 0802 07/16/16 1157 07/17/16 0536 07/17/16 1436  CALCIUM 9.2 9.3 9.1 9.3 9.1  MG 2.7*  --   --  2.4  --   PHOS  --  2.8 3.8 6.0*  --     CBC  Recent Labs Lab 07/16/16 0255 07/16/16 1354 07/17/16 0536  WBC 9.8 9.0 7.9  HGB 12.0* 10.6* 10.6*  HCT 37.7* 34.0* 34.1*  PLT 165 146* 148*    Coag's  Recent Labs Lab 07/13/16 0922 07/16/16 0255 07/17/16 0536  APTT  --  44*  --   INR 1.7 2.78 2.12    Sepsis Markers No results for input(s): LATICACIDVEN, PROCALCITON, O2SATVEN in the last 168 hours.  ABG No results for input(s): PHART, PCO2ART, PO2ART in the last 168 hours.  Liver Enzymes  Recent Labs Lab 07/16/16 0255 07/16/16 0802 07/16/16 1157  AST 28  --   --   ALT 17  --   --   ALKPHOS 137*  --   --   BILITOT 0.6  --   --   ALBUMIN 3.7 3.9 3.3*    Cardiac Enzymes  Recent Labs Lab 07/16/16 0255  TROPONINI 0.07*    Glucose  Recent Labs Lab 07/16/16 0416 07/16/16 1137 07/16/16 1657 07/16/16 1956 07/17/16 1223  GLUCAP 148* 160* 91 95 98    Imaging No results found. STUDIES:   CULTURES: none  ANTIBIOTICS: None  SIGNIFICANT EVENTS:   LINES/TUBES: PIV  DISCUSSION: Mr. Harkless is a 49M with PMH of ESRD on iHD TTSa who presented with symptomatic and critical hyperkalemia. He was temporized x 2 with minimal improvement and required emergent hemodialysis.  It appears his hyperkalemia is most likely due to AVF malfxn and recirculation of K His dry EDW: 107.5 kg He remained hyperkalemic this am (12/31). Renal felt possibly d/t recirculating K and lower dialysis flow rate. Got HD again 12/31.   ASSESSMENT / PLAN:  Acute hyperkalemia with bradycardia and ECG changes ESRD on [facility] following  F/u chem Concern for AVF malfxn and plan is for fistulogram Tuesday to see  if AVG can be salvaged.   Bradycardia in setting of hyperkalemia  Hx chronic a fib on warfarin Plan  Correct underlying hyperkalemia  Therapeutic anticoagulation  Plan  Watery diarrhea x several days-->resolved  Plan:   Check c-diff if able    Chronic anemia - stable Plan:  Trend CBC  Hx diabetes Plan:   CBGs and SSI  [facility] assume primary care 1/1  Bruce Cole ACNP-BC [place] Pager # [phone] OR # [phone] if no answer   07/17/2016, 5:22 PM  Baltazar Apo, MD, PhD 07/19/2016, 8:08 PM [facility] [phone] or if no answer [phone]

## 2016-07-17 NOTE — Progress Notes (Signed)
CRITICAL VALUE ALERT  Critical value received: potassium 6.9  Date of notification:  07/17/2016  Time of notification: 0924  Critical value read back:yes  Nurse who received alert:  Ludwig Clarks  MD notified (1st page):  Dr. Lamonte Sakai  Time of first page:  [phone]  Responding MD:  Dr. Lamonte Sakai  Time MD responded:  [phone]

## 2016-07-17 NOTE — Progress Notes (Signed)
Arrived to patient room 6N-01.  Reviewed treatment plan and this RN agrees.  Report received from bedside RN, Drue Dun.  Consent verified.  Patient A & O X 4. Lung sounds diminished to ausculation in all fields. Generalized BLE +1 pitting edema. Cardiac: SB, HB.  Prepped LUAVF with alcohol and cannulated with two 17 gauge needles.  Pulsation of blood noted.  Flushed access well with saline per protocol.  Connected and secured lines and initiated tx at 0938.  UF goal of 2500 mL and net fluid removal of 2000 mL.  Will continue to monitor.

## 2016-07-17 NOTE — Progress Notes (Signed)
Dialysis treatment completed.  2500 mL ultrafiltrated and net fluid removal 2000 mL.    Patient status unchanged. Lung sounds clear to ausculation in all fields. Generalized edema. Cardiac: SB.  Disconnected lines and removed needles.  Pressure held for 10 minutes and band aid/gauze dressing applied.  Report given to bedside RN, Drue Dun.

## 2016-07-17 NOTE — Progress Notes (Signed)
Patient refuses CBG.

## 2016-07-17 NOTE — Progress Notes (Signed)
[facility] Progress Note   Dialysis Orders: [facility] on TTS since 5/17 . Primary Nephrologist [place]. EDW 107.5 kg.- gets to variable HD Bath 2K, 2.25 Ca, Dialyzer 180NR, Heparin 6000. Access LUA AVF- last Mircera 12/26 50 then stopped-   Assessment/Plan: 1. Hyperkalemia on tele - persistent despite dialysis - suspect recirculating K down to 5.3 2 hours post HD yesterday and back to up 6.9 this am on a renal diet- BFR 325 at present - already cannulated - HD just being initiated - change to 1 K for the full treatment today due to lower flows and probably recirculation - will set up f'gram with IR - check K again 2 hour post HD today and at 6 pm - may need kayexalate;  (yesterday had full BFR and 15 gauge needles 1 up and 1 down without problem) Have discussed with Drs. Yamgata and Dr. Trula Slade. [facility] will schedule for a f'gram on Tuesday before abandoning AVF.  They can place a temp cath in the interim if need be.  Continue on telemetry 2. ESRD - TTS - off schedule - HD today 12/31 (Sunday) 3. Anemia - hgb 10.6 down from last outpt hgb 11.4 12/28 - received Mircera 50 12/26 and stopped- recheck in am 4. Secondary hyperparathyroidism - cont binders and hectorol 5. HTN/volume - BP 90s lower here than outpt unit - not clear why - net UF 4 L yesterday - not weighed pre or post - wt not done pre HD today - bed scale 113.8 ? acccurate goal 2.5 today 6. Nutrition - renal diet/vit 7. Diarrhea - prior to admission - obviously not that much if K was > 7.5 - none since admission 8. DM - per primary  Myriam Jacobson, PA-C [place] [phone] 07/17/2016,9:49 AM  LOS: 1 day    Pt seen, examined and agree w A/P as above. Persistent hyperkalemia suggesting possible recirculation as above. Plan HD again today, fistulogram on Tuesday.   Kelly Splinter MD [facility] pager [phone]   07/17/2016, 2:32 PM    Subjective:   Talked RN into using 17 gauge  needles today  "because he is a weenie -and both needles up"  Objective Vitals:   07/16/16 0915 07/16/16 1146 07/16/16 2053 07/17/16 0645  BP: 110/61 (!) 99/56 (!) 103/47 (!) 95/48  Pulse: (!) 58 (!) 56 (!) 59 (!) 47  Resp: (!) 22 20 19 17   Temp: 98.5 F (36.9 C) 98.8 F (37.1 C) 98.1 F (36.7 C) 98.6 F (37 C)  TempSrc: Oral Oral Oral   SpO2: 100% 99% 96% 97%  Weight:      Height:       Physical Exam General: NAD Heart: RRR Lungs: no rales Abdomen:soft NT Extremities: no sig edema  Dialysis Access: left upper AVF Qb 325   Additional Objective Labs: Basic Metabolic Panel:  Recent Labs Lab 07/16/16 0802 07/16/16 1157 07/17/16 0536  NA 137 137 136  K 6.0* 5.3* 6.9*  CL 101 97* 97*  CO2 24 28 24   GLUCOSE 115* 134* 95  BUN 35* 25* 38*  CREATININE 5.90* 5.83* 7.99*  CALCIUM 9.3 9.1 9.3  PHOS 2.8 3.8 6.0*   Liver Function Tests:  Recent Labs Lab 07/16/16 0255 07/16/16 0802 07/16/16 1157  AST 28  --   --   ALT 17  --   --   ALKPHOS 137*  --   --   BILITOT 0.6  --   --   PROT 7.9  --   --  ALBUMIN 3.7 3.9 3.3*   CBC:  Recent Labs Lab 07/16/16 0255 07/16/16 1354 07/17/16 0536  WBC 9.8 9.0 7.9  NEUTROABS 8.1*  --   --   HGB 12.0* 10.6* 10.6*  HCT 37.7* 34.0* 34.1*  MCV 96.7 95.5 95.3  PLT 165 146* 148*   Blood Culture    Component Value Date/Time   SDES FLUID PLEURAL RIGHT 11/22/2015 0916   SDES FLUID PLEURAL RIGHT 11/22/2015 0916   SPECREQUEST NONE 11/22/2015 0916   SPECREQUEST NONE 11/22/2015 0916   CULT NO GROWTH 5 DAYS 11/22/2015 0916   REPTSTATUS 11/27/2015 FINAL 11/22/2015 0916   REPTSTATUS 11/22/2015 FINAL 11/22/2015 0916    Cardiac Enzymes:  Recent Labs Lab 07/16/16 0255  TROPONINI 0.07*   CBG:  Recent Labs Lab 07/16/16 0416 07/16/16 1137 07/16/16 1657 07/16/16 1956  GLUCAP 148* 160* 91 95   Lab Results  Component Value Date   INR 2.12 07/17/2016   INR 2.78 07/16/2016   INR 1.7 07/13/2016    Studies/Results: Dg Chest Port 1 View  Result Date: 07/16/2016 CLINICAL DATA:  Weakness, dyspnea, dialysis patient. EXAM: PORTABLE CHEST 1 VIEW COMPARISON:  None. FINDINGS: Cardiomegaly with external defibrillator paddle projecting over the left hemithorax. There is interstitial edema. No pneumothorax nor definite effusion. Aortic atherosclerosis. Right costophrenic angle is excluded on this study. IMPRESSION: Cardiomegaly with interstitial pulmonary edema. Electronically Signed   By: Ashley Royalty M.D.   On: 07/16/2016 04:23   Medications:  . allopurinol  100 mg Oral Daily  . doxercalciferol  2 mcg Intravenous Q T,Th,Sa-HD  . gabapentin  300 mg Oral QHS  . hydroxychloroquine  200 mg Oral Daily  . insulin aspart  0-5 Units Subcutaneous QHS  . insulin aspart  0-9 Units Subcutaneous TID WC  . multivitamin  1 tablet Oral QHS  . pantoprazole  40 mg Oral Q1200  . sevelamer carbonate  2,400 mg Oral TID WC  . warfarin  7.5 mg Oral q1800  . Warfarin - Physician Dosing Inpatient   Does not apply [phone]

## 2016-07-18 DIAGNOSIS — I10 Essential (primary) hypertension: Secondary | ICD-10-CM

## 2016-07-18 DIAGNOSIS — E1122 Type 2 diabetes mellitus with diabetic chronic kidney disease: Secondary | ICD-10-CM

## 2016-07-18 LAB — GLUCOSE, CAPILLARY
Glucose-Capillary: 96 mg/dL (ref 65–99)
Glucose-Capillary: 100 mg/dL — ABNORMAL HIGH (ref 65–99)
Glucose-Capillary: 97 mg/dL (ref 65–99)
Glucose-Capillary: 114 mg/dL — ABNORMAL HIGH (ref 65–99)

## 2016-07-18 LAB — BASIC METABOLIC PANEL
Anion gap: 13 (ref 5–15)
BUN: 32 mg/dL — ABNORMAL HIGH (ref 6–20)
CO2: 26 mmol/L (ref 22–32)
Calcium: 9.1 mg/dL (ref 8.9–10.3)
Chloride: 94 mmol/L — ABNORMAL LOW (ref 101–111)
Creatinine, Ser: 6.93 mg/dL — ABNORMAL HIGH (ref 0.61–1.24)
GFR calc Af Amer: 8 mL/min — ABNORMAL LOW (ref >60)
GFR calc non Af Amer: 7 mL/min — ABNORMAL LOW (ref >60)
Glucose, Bld: 92 mg/dL (ref 65–99)
Potassium: 5.3 mmol/L — ABNORMAL HIGH (ref 3.5–5.1)
Sodium: 133 mmol/L — ABNORMAL LOW (ref 135–145)

## 2016-07-18 LAB — CBC
HCT: 33.6 % — ABNORMAL LOW (ref 39.0–52.0)
Hemoglobin: 10.5 g/dL — ABNORMAL LOW (ref 13.0–17.0)
MCH: 29.5 pg (ref 26.0–34.0)
MCHC: 31.3 g/dL (ref 30.0–36.0)
MCV: 94.4 fL (ref 78.0–100.0)
Platelets: 140 10*3/uL — ABNORMAL LOW (ref 150–400)
RBC: 3.56 MIL/uL — ABNORMAL LOW (ref 4.22–5.81)
RDW: 15.6 % — ABNORMAL HIGH (ref 11.5–15.5)
WBC: 7 10*3/uL (ref 4.0–10.5)

## 2016-07-18 LAB — PROTIME-INR
INR: 2.11
PROTHROMBIN TIME: 24 s — AB (ref 11.4–15.2)

## 2016-07-18 MED ORDER — SODIUM POLYSTYRENE SULFONATE 15 GM/60ML PO SUSP
30.0000 g | Freq: Once | ORAL | Status: AC
  Administered 2016-07-18: 30 g via ORAL
  Filled 2016-07-18: qty 120

## 2016-07-18 NOTE — Progress Notes (Signed)
PROGRESS NOTE  Bruce Cole:[id number] DOB: February 12, 1941 DOA: 07/16/2016 PCP: Nance Pear., NP   LOS: 2 days   Brief Narrative: [name] is a 89M with PMH significant for a fib on chronic a/c with warfarin, chronic combined heart failure, ESRD on iHD TuThSa, GERD/gastritis, HTN, DMII, osteoarthritis with recent THA, presents to ED via EMS from home with complaints of loose stool, weakness, gait instability and shortness of breath. HR by EMS was int he low 50s. On arrival to the ED,  HR low 50s, adequate BP, O2 sats OK, afebrile. Initial labs showed K > 7.5, HCO3 16, Cr 9.9 BUN 67. CBC notable only for mild chronic anemia with Hgb 12.   Assessment & Plan: Active Problems:   Essential hypertension   ATRIAL FIBRILLATION   DM (diabetes mellitus), type 2 with renal complications (HCC)   Long term current use of anticoagulant therapy   Chronic combined systolic and diastolic heart failure (HCC)   ESRD on dialysis ([place])   Acute hyperkalemia   ESRD with acute hyperkalemia with bradycardia and ECG changes - Nephrology following, he received dialysis and his potassium has improved, 5.3 this morning - Concern for AVF malfunction and plan is for fistulogram tomorrow per vascular surgery.  Chronic combined systolic and diastolic heart failure - Fluid management per nephrology, he appears euvolemic on exam  Chronic atrial fibrillation - CHADSVASC score at least 2, continue Coumadin, INR 2.1 on this morning  Diarrhea - Resolved, C diff was negative  Chronic anemia  - stable, in the setting of end-stage renal disease  Diabetes  - SSI, CBGs 80-90s - Hemoglobin A1c 5.5   DVT prophylaxis: Coumadin Code Status: Full Family Communication: no family bedside Disposition Plan: TBD  Consultants:   Nephrology   Vascular surgery  Procedures:   HD  Antimicrobials:  None    Subjective: - no chest pain, shortness of breath, no abdominal pain, nausea or vomiting.  Feeling at baseline  Objective: Vitals:   07/17/16 1241 07/17/16 1506 07/17/16 2107 07/18/16 0635  BP: (!) 105/52 (!) 88/60 93/63 (!) 98/59  Pulse: (!) 47 84 64 (!) 54  Resp:  18 19 18   Temp:  98.4 F (36.9 C) 99.1 F (37.3 C) 98.4 F (36.9 C)  TempSrc:  Oral Oral   SpO2:  100% 100% 97%  Weight:    110.9 kg (244 lb 7.8 oz)  Height:        Intake/Output Summary (Last 24 hours) at 07/18/16 1119 Last data filed at 07/18/16 0859  Gross per 24 hour  Intake              240 ml  Output             2050 ml  Net            -1810 ml   Filed Weights   07/17/16 0911 07/17/16 1238 07/18/16 0635  Weight: 113.8 kg (250 lb 14.1 oz) 111.8 kg (246 lb 7.6 oz) 110.9 kg (244 lb 7.8 oz)    Examination: Constitutional: NAD Vitals:   07/17/16 1241 07/17/16 1506 07/17/16 2107 07/18/16 0635  BP: (!) 105/52 (!) 88/60 93/63 (!) 98/59  Pulse: (!) 47 84 64 (!) 54  Resp:  18 19 18   Temp:  98.4 F (36.9 C) 99.1 F (37.3 C) 98.4 F (36.9 C)  TempSrc:  Oral Oral   SpO2:  100% 100% 97%  Weight:    110.9 kg (244 lb 7.8 oz)  Height:  Eyes: PERRL, lids and conjunctivae normal Respiratory: clear to auscultation bilaterally, no wheezing, no crackles Cardiovascular: irregular, 3/6 SEM Abdomen: no tenderness. Bowel sounds positive.  Musculoskeletal: no clubbing / cyanosis.  Skin: no rashes, lesions, ulcers. No induration Neurologic: non focal    Data Reviewed: I have personally reviewed following labs and imaging studies  CBC:  Recent Labs Lab 07/16/16 0255 07/16/16 1354 07/17/16 0536 07/18/16 0412  WBC 9.8 9.0 7.9 7.0  NEUTROABS 8.1*  --   --   --   HGB 12.0* 10.6* 10.6* 10.5*  HCT 37.7* 34.0* 34.1* 33.6*  MCV 96.7 95.5 95.3 94.4  PLT 165 146* 148* 419*   Basic Metabolic Panel:  Recent Labs Lab 07/16/16 0255 07/16/16 0802 07/16/16 1157 07/17/16 0536 07/17/16 1436 07/17/16 1829 07/18/16 0412  NA 136 137 137 136 132* 134* 133*  K >7.5* 6.0* 5.3* 6.9* 4.6 5.0 5.3*  CL  108 101 97* 97* 94* 94* 94*  CO2 16* 24 28 24 28 27 26   GLUCOSE 139* 115* 134* 95 126* 139* 92  BUN 67* 35* 25* 38* 23* 24* 32*  CREATININE 9.90* 5.90* 5.83* 7.99* 5.28* 6.03* 6.93*  CALCIUM 9.2 9.3 9.1 9.3 9.1 9.2 9.1  MG 2.7*  --   --  2.4  --   --   --   PHOS  --  2.8 3.8 6.0*  --   --   --    GFR: Estimated Creatinine Clearance: 12.8 mL/min (by C-G formula based on SCr of 6.93 mg/dL (H)). Liver Function Tests:  Recent Labs Lab 07/16/16 0255 07/16/16 0802 07/16/16 1157  AST 28  --   --   ALT 17  --   --   ALKPHOS 137*  --   --   BILITOT 0.6  --   --   PROT 7.9  --   --   ALBUMIN 3.7 3.9 3.3*   No results for input(s): LIPASE, AMYLASE in the last 168 hours. No results for input(s): AMMONIA in the last 168 hours. Coagulation Profile:  Recent Labs Lab 07/13/16 0922 07/16/16 0255 07/17/16 0536 07/18/16 0412  INR 1.7 2.78 2.12 2.11   Cardiac Enzymes:  Recent Labs Lab 07/16/16 0255  TROPONINI 0.07*   BNP (last 3 results) No results for input(s): PROBNP in the last 8760 hours. HbA1C: No results for input(s): HGBA1C in the last 72 hours. CBG:  Recent Labs Lab 07/16/16 1956 07/17/16 1223 07/17/16 1802 07/17/16 2222 07/18/16 0740  GLUCAP 95 98 100* 89 96   Lipid Profile: No results for input(s): CHOL, HDL, LDLCALC, TRIG, CHOLHDL, LDLDIRECT in the last 72 hours. Thyroid Function Tests: No results for input(s): TSH, T4TOTAL, FREET4, T3FREE, THYROIDAB in the last 72 hours. Anemia Panel: No results for input(s): VITAMINB12, FOLATE, FERRITIN, TIBC, IRON, RETICCTPCT in the last 72 hours. Urine analysis:    Component Value Date/Time   COLORURINE YELLOW 11/10/2015 1800   APPEARANCEUR CLEAR 11/10/2015 1800   LABSPEC 1.012 11/10/2015 1800   PHURINE 5.5 11/10/2015 1800   GLUCOSEU NEGATIVE 11/10/2015 1800   GLUCOSEU NEGATIVE 05/05/2015 1135   HGBUR NEGATIVE 11/10/2015 1800   HGBUR negative 09/21/2009 1008   BILIRUBINUR NEGATIVE 11/10/2015 1800   KETONESUR  NEGATIVE 11/10/2015 1800   PROTEINUR NEGATIVE 11/10/2015 1800   UROBILINOGEN 0.2 05/05/2015 1135   NITRITE NEGATIVE 11/10/2015 1800   LEUKOCYTESUR NEGATIVE 11/10/2015 1800   Sepsis Labs: Invalid input(s): PROCALCITONIN, LACTICIDVEN  Recent Results (from the past 240 hour(s))  C difficile quick scan w PCR reflex  Status: None   Collection Time: 07/17/16  6:22 PM  Result Value Ref Range Status   C Diff antigen NEGATIVE NEGATIVE Final   C Diff toxin NEGATIVE NEGATIVE Final   C Diff interpretation No C. difficile detected.  Final      Radiology Studies: No results found.   Scheduled Meds: . allopurinol  100 mg Oral Daily  . doxercalciferol  2 mcg Intravenous Q T,Th,Sa-HD  . gabapentin  300 mg Oral QHS  . hydroxychloroquine  200 mg Oral Daily  . insulin aspart  0-5 Units Subcutaneous QHS  . insulin aspart  0-9 Units Subcutaneous TID WC  . multivitamin  1 tablet Oral QHS  . pantoprazole  40 mg Oral Q1200  . sevelamer carbonate  2,400 mg Oral TID WC  . warfarin  7.5 mg Oral q1800  . Warfarin - Physician Dosing Inpatient   Does not apply q1800   Continuous Infusions:   Marzetta Board, MD, PhD [facility] Pager [phone]  If 7PM-7AM, please contact night-coverage [email or web address] Password [email or web address] 07/18/2016, 11:19 AM

## 2016-07-18 NOTE — Progress Notes (Signed)
    Subjective  -   Patient having trouble with HD History of Left BVT which was revised in November   Physical Exam:  Palpable thrill in fistula       Assessment/Plan:    We discussed proceeding with fistulogram tomorrow to evaluate his BVT.  Any intervention will be done at that time to salvage his fistula  Alizia Greif, [name] 07/18/2016 11:05 AM --  Vitals:   07/17/16 2107 07/18/16 0635  BP: 93/63 (!) 98/59  Pulse: 64 (!) 54  Resp: 19 18  Temp: 99.1 F (37.3 C) 98.4 F (36.9 C)    Intake/Output Summary (Last 24 hours) at 07/18/16 1105 Last data filed at 07/18/16 0859  Gross per 24 hour  Intake              240 ml  Output             2050 ml  Net            -1810 ml     Laboratory CBC    Component Value Date/Time   WBC 7.0 07/18/2016 0412   HGB 10.5 (L) 07/18/2016 0412   HCT 33.6 (L) 07/18/2016 0412   PLT 140 (L) 07/18/2016 0412    BMET    Component Value Date/Time   NA 133 (L) 07/18/2016 0412   NA 137 04/08/2016   K 5.3 (H) 07/18/2016 0412   CL 94 (L) 07/18/2016 0412   CO2 26 07/18/2016 0412   GLUCOSE 92 07/18/2016 0412   BUN 32 (H) 07/18/2016 0412   BUN 38 (A) 04/08/2016   CREATININE 6.93 (H) 07/18/2016 0412   CREATININE 1.77 (H) 02/13/2013 1505   CALCIUM 9.1 07/18/2016 0412   GFRNONAA 7 (L) 07/18/2016 0412   GFRAA 8 (L) 07/18/2016 0412    COAG Lab Results  Component Value Date   INR 2.11 07/18/2016   INR 2.12 07/17/2016   INR 2.78 07/16/2016   No results found for: PTT  Antibiotics Anti-infectives    Start     Dose/Rate Route Frequency Ordered Stop   07/16/16 1200  hydroxychloroquine (PLAQUENIL) tablet 200 mg     200 mg Oral Daily 07/16/16 0433         V. Leia Alf, M.D. [facility] [place] Office: [phone] Pager:  [phone]

## 2016-07-18 NOTE — Progress Notes (Signed)
[facility] [place]  HPI: 76 y/o male with ESRD s/p left radiocephalic av fistula failed, then Dr. Donnetta Hutching placed left basilic vein transposition fistula 11/23/2015.  On 05/30/2016 Dr. Bridgett Larsson performed a revision of left upper arm fistula with revision of anastomosis.   There was extensive inflammation surrounding the brachial artery.  He states he is still having difficulties with HD.  He is very frustrated at this time.     Objective (!) 98/59 (!) 54 98.4 F (36.9 C) 18 97%  Intake/Output Summary (Last 24 hours) at 07/18/16 1104 Last data filed at 07/18/16 0859  Gross per 24 hour  Intake              240 ml  Output             2050 ml  Net            -1810 ml    Left UE AV fistula Palpable radial pulse and good thrill. Sensation and motor intact left hand without symptoms of steal.   Lungs non labored breathing Heart RRR  Assessment/Planning: Malfunctioning av fistula with poor flow on HD  We plan on fistulogram with possible intervention study tomorrow in the afternoon.  He can eat breakfast and HD is scheduled for the am. NPO after breakfast   Theda Sers, [place] 07/18/2016 11:04 AM --  Laboratory Lab Results:  Recent Labs  07/17/16 0536 07/18/16 0412  WBC 7.9 7.0  HGB 10.6* 10.5*  HCT 34.1* 33.6*  PLT 148* 140*   BMET  Recent Labs  07/17/16 1829 07/18/16 0412  NA 134* 133*  K 5.0 5.3*  CL 94* 94*  CO2 27 26  GLUCOSE 139* 92  BUN 24* 32*  CREATININE 6.03* 6.93*  CALCIUM 9.2 9.1    COAG Lab Results  Component Value Date   INR 2.11 07/18/2016   INR 2.12 07/17/2016   INR 2.78 07/16/2016   No results found for: PTT

## 2016-07-18 NOTE — Progress Notes (Signed)
[facility] Progress Note   Subjective:   "I'm doing OK". Lying flat in bed, no C/Os.   Objective Vitals:   07/17/16 1241 07/17/16 1506 07/17/16 2107 07/18/16 0635  BP: (!) 105/52 (!) 88/60 93/63 (!) 98/59  Pulse: (!) 47 84 64 (!) 54  Resp:  18 19 18   Temp:  98.4 F (36.9 C) 99.1 F (37.3 C) 98.4 F (36.9 C)  TempSrc:  Oral Oral   SpO2:  100% 100% 97%  Weight:    110.9 kg (244 lb 7.8 oz)  Height:       Physical Exam General: WN,WD, NAD Heart: H2,D9, 2/6 systolic M.  Lungs: CTAB A/P Abdomen: Active BS, non-tender Extremities: No LE edema. Dry, woody appearance LE. Dialysis Access: LUA AVF + bruit +thrill  Labs:   Recent Labs Lab 07/16/16 0802 07/16/16 1157 07/17/16 0536 07/17/16 1436 07/17/16 1829 07/18/16 0412  NA 137 137 136 132* 134* 133*  K 6.0* 5.3* 6.9* 4.6 5.0 5.3*  CL 101 97* 97* 94* 94* 94*  CO2 24 28 24 28 27 26   GLUCOSE 115* 134* 95 126* 139* 92  BUN 35* 25* 38* 23* 24* 32*  CREATININE 5.90* 5.83* 7.99* 5.28* 6.03* 6.93*  CALCIUM 9.3 9.1 9.3 9.1 9.2 9.1  PHOS 2.8 3.8 6.0*  --   --   --      Recent Labs Lab 07/16/16 0255 07/16/16 0802 07/16/16 1157  AST 28  --   --   ALT 17  --   --   ALKPHOS 137*  --   --   BILITOT 0.6  --   --   PROT 7.9  --   --   ALBUMIN 3.7 3.9 3.3*     Recent Labs Lab 07/16/16 0255 07/16/16 1354 07/17/16 0536 07/18/16 0412  WBC 9.8 9.0 7.9 7.0  NEUTROABS 8.1*  --   --   --   HGB 12.0* 10.6* 10.6* 10.5*  HCT 37.7* 34.0* 34.1* 33.6*  MCV 96.7 95.5 95.3 94.4  PLT 165 146* 148* 140*     Recent Labs Lab 07/16/16 0255  TROPONINI 0.07*     Recent Labs Lab 07/16/16 1956 07/17/16 1223 07/17/16 1802 07/17/16 2222 07/18/16 0740  GLUCAP 95 98 100* 89 96   Medications:  . allopurinol  100 mg Oral Daily  . doxercalciferol  2 mcg Intravenous Q T,Th,Sa-HD  . gabapentin  300 mg Oral QHS  . hydroxychloroquine  200 mg Oral Daily  . insulin aspart  0-5 Units Subcutaneous QHS  .  insulin aspart  0-9 Units Subcutaneous TID WC  . multivitamin  1 tablet Oral QHS  . pantoprazole  40 mg Oral Q1200  . sevelamer carbonate  2,400 mg Oral TID WC  . warfarin  7.5 mg Oral q1800  . Warfarin - Physician Dosing Inpatient   Does not apply q1800   Dialysis Orders: AFKCon TTS 107.5 kg 400/800 2K/ 2.25 Ca UF Profile 2, Linear Na Heparin 6000 units IV q tx Last Mircera 12/26 50 mcg IV, has been DC'd  Hectoral 2 mcg IV q tx  Assessment/Plan: 1. Hyperkalemia: Adm K+ >7.5. Persistent hyperkalemia despite HD. Possible recirculation. Had revision of AVF 05/30/16 per Dr. Bridgett Larsson.  VVS consulted, for fistulogram tomorrow to attempt salvage of AVF. Rec'd kayexalate yesterday, K+5.3 today. Recheck renal profile this afternoon, give kayexalate if needed. I think reasonable to go ahead and redose kayexalate as K is drifting up and would like not to interfere w/fistulagram scheduled for tomorrow.  2. ESRD -T,Th,S. Last HD 07/17/16. For HD tomorrow on schedule. (after f'gram preferably if intervention is needed) 3. Anemia - HGB 10.5. No OP ESA. Follow HGB.  4. Secondary hyperparathyroidism - Cont binders, VDRA. 5. HTN/volume - BP lower here than OP Center. No antihypertensive meds on OP med list. Usually take furosemide 80 mg PO BID on non-dialysis days. This has not been resumed here. Hold for now D/T lower BP. Wt today 110.9 kg. Get standing wt tomorrow in HD, 3-3.5 liters. Reinforce fld restrictions-has H/O high IDWG.  6. Nutrition - Albumin 3.3. Renal/Carb mod diet. Reinforce fld restrictions. Nepro/Renal vits.  7. H/O chronic Afib: Continue coumadin, pharmacy managing.  8. Diarrhea: H/O watery diarrhea prior to adm. Resolved. Will probably recur after he gets kayexalate 9. DM: per primary  Rita H. Brown NP-C 07/18/2016, 9:12 AM  [employer] [phone]  I have seen and examined this patient and agree with plan and assessment in the above note with renal  recommendations/intervention highlighted. F'gram for tomorrow. K drifting up. Redose kayexalate. HD tomorrow after f'gram.  Maral Lampe B,MD 07/18/2016 1:34 PM

## 2016-07-19 LAB — GLUCOSE, CAPILLARY
GLUCOSE-CAPILLARY: 102 mg/dL — AB (ref 65–99)
Glucose-Capillary: 254 mg/dL — ABNORMAL HIGH (ref 65–99)
Glucose-Capillary: 78 mg/dL (ref 65–99)
Glucose-Capillary: 123 mg/dL — ABNORMAL HIGH (ref 65–99)

## 2016-07-19 LAB — BASIC METABOLIC PANEL
Anion gap: 14 (ref 5–15)
BUN: 46 mg/dL — AB (ref 6–20)
CO2: 27 mmol/L (ref 22–32)
CREATININE: 8.82 mg/dL — AB (ref 0.61–1.24)
Calcium: 8.6 mg/dL — ABNORMAL LOW (ref 8.9–10.3)
Chloride: 91 mmol/L — ABNORMAL LOW (ref 101–111)
GFR calc Af Amer: 6 mL/min — ABNORMAL LOW (ref >60)
GFR, EST NON AFRICAN AMERICAN: 5 mL/min — AB (ref >60)
GLUCOSE: 114 mg/dL — AB (ref 65–99)
Potassium: 3.8 mmol/L (ref 3.5–5.1)
SODIUM: 132 mmol/L — AB (ref 135–145)

## 2016-07-19 LAB — CBC
HCT: 33.1 % — ABNORMAL LOW (ref 39.0–52.0)
Hemoglobin: 10.8 g/dL — ABNORMAL LOW (ref 13.0–17.0)
MCH: 30.2 pg (ref 26.0–34.0)
MCHC: 32.6 g/dL (ref 30.0–36.0)
MCV: 92.5 fL (ref 78.0–100.0)
PLATELETS: 147 10*3/uL — AB (ref 150–400)
RBC: 3.58 MIL/uL — ABNORMAL LOW (ref 4.22–5.81)
RDW: 15.4 % (ref 11.5–15.5)
WBC: 7.4 10*3/uL (ref 4.0–10.5)

## 2016-07-19 LAB — PROTIME-INR
INR: 2.07
Prothrombin Time: 23.7 seconds — ABNORMAL HIGH (ref 11.4–15.2)

## 2016-07-19 MED ORDER — DOXERCALCIFEROL 4 MCG/2ML IV SOLN
INTRAVENOUS | Status: AC
  Filled 2016-07-19: qty 2

## 2016-07-19 MED ORDER — SODIUM CHLORIDE 0.9 % IV SOLN: 100.0000 mL | INTRAVENOUS | Status: DC | PRN

## 2016-07-19 MED ORDER — HEPARIN SODIUM (PORCINE) 1000 UNIT/ML DIALYSIS
6000.0000 [IU] | Freq: Once | INTRAMUSCULAR | Status: DC
  Filled 2016-07-19: qty 6

## 2016-07-19 NOTE — Progress Notes (Signed)
[facility] Progress Note   Subjective:   Brought into HD this AM as f'gram projected to be very late in the day today AVF cannulated without difficulty K looks fine after a dose of kayexalate yesterday (prophylactically)  Objective Vitals:   07/18/16 0635 07/18/16 1427 07/18/16 2102 07/19/16 0528  BP: (!) 98/59 (!) 105/54 (!) 108/58 (!) 101/51  Pulse: (!) 54 (!) 47 (!) 52 99  Resp: 18 18 17 17   Temp: 98.4 F (36.9 C) 98.9 F (37.2 C) 98.6 F (37 C) 98.8 F (37.1 C)  TempSrc:  Oral Oral Oral  SpO2: 97% 97% 100% 97%  Weight: 110.9 kg (244 lb 7.8 oz)     Height:       Physical Exam General: WN,WD, NAD BP (!) 111/54 (BP Location: Right Arm)   Pulse (!) 53   Temp 98.4 F (36.9 C) (Oral)   Resp 18   Ht 6\' 3"  (1.905 m)   Wt 111.1 kg (244 lb 14.9 oz)   SpO2 97%   BMI 30.61 kg/m   Very pleasant, eating bfast Heart: B3,Z3, 2/6 systolic M. Bradycardic 50's Lungs: CTAB A/P Abdomen: Active BS, non-tender Extremities: Trace pitting edema only Dialysis Access: LUA AVF cannulated with 15 ga  Labs:   Recent Labs Lab 07/16/16 0802 07/16/16 1157 07/17/16 0536  07/17/16 1829 07/18/16 0412 07/19/16 0343  NA 137 137 136  < > 134* 133* 132*  K 6.0* 5.3* 6.9*  < > 5.0 5.3* 3.8  CL 101 97* 97*  < > 94* 94* 91*  CO2 24 28 24   < > 27 26 27   GLUCOSE 115* 134* 95  < > 139* 92 114*  BUN 35* 25* 38*  < > 24* 32* 46*  CREATININE 5.90* 5.83* 7.99*  < > 6.03* 6.93* 8.82*  CALCIUM 9.3 9.1 9.3  < > 9.2 9.1 8.6*  PHOS 2.8 3.8 6.0*  --   --   --   --   < > = values in this interval not displayed.   Recent Labs Lab 07/16/16 0255 07/16/16 0802 07/16/16 1157  AST 28  --   --   ALT 17  --   --   ALKPHOS 137*  --   --   BILITOT 0.6  --   --   PROT 7.9  --   --   ALBUMIN 3.7 3.9 3.3*     Recent Labs Lab 07/16/16 0255 07/16/16 1354 07/17/16 0536 07/18/16 0412 07/19/16 0343  WBC 9.8 9.0 7.9 7.0 7.4  NEUTROABS 8.1*  --   --   --   --   HGB 12.0* 10.6*  10.6* 10.5* 10.8*  HCT 37.7* 34.0* 34.1* 33.6* 33.1*  MCV 96.7 95.5 95.3 94.4 92.5  PLT 165 146* 148* 140* 147*     Recent Labs Lab 07/16/16 0255  TROPONINI 0.07*     Recent Labs Lab 07/17/16 2222 07/18/16 0740 07/18/16 1137 07/18/16 1808 07/18/16 2100  GLUCAP 89 96 100* 97 114*   Medications:  . allopurinol  100 mg Oral Daily  . doxercalciferol  2 mcg Intravenous Q T,Th,Sa-HD  . gabapentin  300 mg Oral QHS  . hydroxychloroquine  200 mg Oral Daily  . insulin aspart  0-5 Units Subcutaneous QHS  . insulin aspart  0-9 Units Subcutaneous TID WC  . multivitamin  1 tablet Oral QHS  . pantoprazole  40 mg Oral Q1200  . sevelamer carbonate  2,400 mg Oral TID WC  . Warfarin - Physician  Dosing Inpatient   Does not apply q1800   Dialysis Orders: AFKCon TTS 107.5 kg 400/800 2K/ 2.25 Ca UF Profile 2, Linear Na Heparin 6000 units IV q tx Last Mircera 12/26 50 mcg IV, has been DC'd  Hectoral 2 mcg IV q tx  Assessment 1. Hyperkalemia: Adm K+ >7.5. Persistent hyperkalemia despite HD. Possible recirculation. S/p revision AVF 05/30/16 by Dr. Bridgett Larsson.  VVS consulted, for fistulogram later today to eval AVF. Pre HD K 3.8 (after kayexalate yest). Pt re instructed re dietary K restriction (was using salt sub and eating hi K foods).  2. ESRD -T,Th,S. On schedule. 3. Anemia - HGB 10.8. No OP ESA.  4. Secondary hyperparathyroidism - Cont binder, hectorol 5. HTN/volume -  BP's soft on HD (he says is usual). Pre weight 111.1 (EDW 107.5) Lungs clear/min edema. May need incr EDW  6. Nutrition - Albumin 3.3. Renal/Carb mod diet. Reinforce fld restrictions. Nepro/Renal vits.  7. H/O chronic Afib: Slow VR. Pharmacy managing coumadin..  8. Diarrhea: H/O watery diarrhea prior to adm. Resolved.  9. DM: per primary  Plan - HD today, f'gram this afternoon, diet instruction, poss EDW adjustment ^  Bruce Maes, MD [place] Pager 07/19/2016, 7:52 AM

## 2016-07-19 NOTE — Progress Notes (Signed)
    Subjective  -   Tolerating HD today via left BVT   Physical Exam:  Functioning left BVT Non-labored breathing       Assessment/Plan:    Coumadin was held last night but INR still greater than 2.0.  His K has improved.  I am cancelling his procedure today and will place him on the schedule tomorrow for a fistulagram.  As long as his INR is < 2.0 we will proceed, otherwise we will delay this until it is < 2.0.  Continue to hold coumadin  Richardson Dubree, [name] 07/19/2016 5:16 PM --  Vitals:   07/19/16 1200 07/19/16 1300  BP: (!) 98/59 92/63  Pulse: (!) 102 (!) 56  Resp:    Temp: 97.5 F (36.4 C) 97.5 F (36.4 C)    Intake/Output Summary (Last 24 hours) at 07/19/16 1716 Last data filed at 07/19/16 1122  Gross per 24 hour  Intake              420 ml  Output             3000 ml  Net            -2580 ml     Laboratory CBC    Component Value Date/Time   WBC 7.4 07/19/2016 0343   HGB 10.8 (L) 07/19/2016 0343   HCT 33.1 (L) 07/19/2016 0343   PLT 147 (L) 07/19/2016 0343    BMET    Component Value Date/Time   NA 132 (L) 07/19/2016 0343   NA 137 04/08/2016   K 3.8 07/19/2016 0343   CL 91 (L) 07/19/2016 0343   CO2 27 07/19/2016 0343   GLUCOSE 114 (H) 07/19/2016 0343   BUN 46 (H) 07/19/2016 0343   BUN 38 (A) 04/08/2016   CREATININE 8.82 (H) 07/19/2016 0343   CREATININE 1.77 (H) 02/13/2013 1505   CALCIUM 8.6 (L) 07/19/2016 0343   GFRNONAA 5 (L) 07/19/2016 0343   GFRAA 6 (L) 07/19/2016 0343    COAG Lab Results  Component Value Date   INR 2.07 07/19/2016   INR 2.11 07/18/2016   INR 2.12 07/17/2016   No results found for: PTT  Antibiotics Anti-infectives    Start     Dose/Rate Route Frequency Ordered Stop   07/16/16 1200  hydroxychloroquine (PLAQUENIL) tablet 200 mg     200 mg Oral Daily 07/16/16 0433         V. Leia Alf, M.D. [facility] [place] Office: [phone] Pager:  [phone]

## 2016-07-19 NOTE — Procedures (Signed)
I have personally attended this patient's dialysis session.   4K bath for K 3.8 No cannulation issues BP 90's w/4L goal Poss upward adjustment EDW - eval post TMT  Jamal Maes, MD [facility] [phone] Pager 07/19/2016, 7:55 AM

## 2016-07-19 NOTE — Progress Notes (Signed)
PROGRESS NOTE  MANDEEP KISER ZOX:[id number] DOB: 23-May-1941 DOA: 07/16/2016 PCP: Nance Pear., NP   LOS: 3 days   Brief Narrative: Mr. Rubinstein is a 38M with PMH significant for a fib on chronic a/c with warfarin, chronic combined heart failure, ESRD on iHD TuThSa, GERD/gastritis, HTN, DMII, osteoarthritis with recent THA, presents to ED via EMS from home with complaints of loose stool, weakness, gait instability and shortness of breath. HR by EMS was int he low 50s. On arrival to the ED,  HR low 50s, adequate BP, O2 sats OK, afebrile. Initial labs showed K > 7.5, HCO3 16, Cr 9.9 BUN 67. CBC notable only for mild chronic anemia with Hgb 12.   Assessment & Plan: Active Problems:   Essential hypertension   ATRIAL FIBRILLATION   DM (diabetes mellitus), type 2 with renal complications (HCC)   Long term current use of anticoagulant therapy   Chronic combined systolic and diastolic heart failure (HCC)   ESRD on dialysis ([place])   Acute hyperkalemia   ESRD with acute hyperkalemia with bradycardia and ECG changes - Nephrology following, he received dialysis and his potassium has improved and is now within normal limits. There are concerns for AVF malfunction and plan is for fistulogram later today per vascular surgery.  Chronic combined systolic and diastolic heart failure  - Fluid management per nephrology, he appears euvolemic on exam  Chronic atrial fibrillation - CHADSVASC score at least 2, continue Coumadin, INR 2.07 on this morning  Diarrhea - Resolved, C diff was negative  Chronic anemia  - stable, in the setting of end-stage renal disease  Diabetes  - SSI, CBGs 80-90s - Hemoglobin A1c 5.5   DVT prophylaxis: Coumadin Code Status: Full Family Communication: no family bedside Disposition Plan: pending vascular surgery evaluation / fistulogram  Consultants:   Nephrology   Vascular surgery  Procedures:   HD  Antimicrobials:  None    Subjective: - no  chest pain, shortness of breath, no abdominal pain, nausea or vomiting. Feeling at baseline. Seen in hemodialysis  Objective: Vitals:   07/19/16 0800 07/19/16 0830 07/19/16 0900 07/19/16 0930  BP: 115/60 103/60 (!) 111/54 102/62  Pulse: (!) 48 (!) 51 (!) 47 (!) 51  Resp: 18 20 18  (!) 24  Temp:      TempSrc:      SpO2:      Weight:      Height:        Intake/Output Summary (Last 24 hours) at 07/19/16 1029 Last data filed at 07/19/16 0531  Gross per 24 hour  Intake              540 ml  Output                0 ml  Net              540 ml   Filed Weights   07/17/16 1238 07/18/16 0635 07/19/16 0715  Weight: 111.8 kg (246 lb 7.6 oz) 110.9 kg (244 lb 7.8 oz) 111.1 kg (244 lb 14.9 oz)    Examination: Constitutional: NAD Vitals:   07/19/16 0800 07/19/16 0830 07/19/16 0900 07/19/16 0930  BP: 115/60 103/60 (!) 111/54 102/62  Pulse: (!) 48 (!) 51 (!) 47 (!) 51  Resp: 18 20 18  (!) 24  Temp:      TempSrc:      SpO2:      Weight:      Height:       Eyes: PERRL, lids and  conjunctivae normal Respiratory: clear to auscultation bilaterally, no wheezing, no crackles Cardiovascular: irregular, 3/6 SEM Abdomen: no tenderness. Bowel sounds positive.  Musculoskeletal: no clubbing / cyanosis.  Skin: no rashes, lesions, ulcers. No induration Neurologic: non focal    Data Reviewed: I have personally reviewed following labs and imaging studies  CBC:  Recent Labs Lab 07/16/16 0255 07/16/16 1354 07/17/16 0536 07/18/16 0412 07/19/16 0343  WBC 9.8 9.0 7.9 7.0 7.4  NEUTROABS 8.1*  --   --   --   --   HGB 12.0* 10.6* 10.6* 10.5* 10.8*  HCT 37.7* 34.0* 34.1* 33.6* 33.1*  MCV 96.7 95.5 95.3 94.4 92.5  PLT 165 146* 148* 140* 616*   Basic Metabolic Panel:  Recent Labs Lab 07/16/16 0255 07/16/16 0802 07/16/16 1157 07/17/16 0536 07/17/16 1436 07/17/16 1829 07/18/16 0412 07/19/16 0343  NA 136 137 137 136 132* 134* 133* 132*  K >7.5* 6.0* 5.3* 6.9* 4.6 5.0 5.3* 3.8  CL 108 101  97* 97* 94* 94* 94* 91*  CO2 16* 24 28 24 28 27 26 27   GLUCOSE 139* 115* 134* 95 126* 139* 92 114*  BUN 67* 35* 25* 38* 23* 24* 32* 46*  CREATININE 9.90* 5.90* 5.83* 7.99* 5.28* 6.03* 6.93* 8.82*  CALCIUM 9.2 9.3 9.1 9.3 9.1 9.2 9.1 8.6*  MG 2.7*  --   --  2.4  --   --   --   --   PHOS  --  2.8 3.8 6.0*  --   --   --   --    GFR: Estimated Creatinine Clearance: 10 mL/min (by C-G formula based on SCr of 8.82 mg/dL (H)). Liver Function Tests:  Recent Labs Lab 07/16/16 0255 07/16/16 0802 07/16/16 1157  AST 28  --   --   ALT 17  --   --   ALKPHOS 137*  --   --   BILITOT 0.6  --   --   PROT 7.9  --   --   ALBUMIN 3.7 3.9 3.3*   No results for input(s): LIPASE, AMYLASE in the last 168 hours. No results for input(s): AMMONIA in the last 168 hours. Coagulation Profile:  Recent Labs Lab 07/13/16 0922 07/16/16 0255 07/17/16 0536 07/18/16 0412 07/19/16 0343  INR 1.7 2.78 2.12 2.11 2.07   Cardiac Enzymes:  Recent Labs Lab 07/16/16 0255  TROPONINI 0.07*   BNP (last 3 results) No results for input(s): PROBNP in the last 8760 hours. HbA1C: No results for input(s): HGBA1C in the last 72 hours. CBG:  Recent Labs Lab 07/17/16 2222 07/18/16 0740 07/18/16 1137 07/18/16 1808 07/18/16 2100  GLUCAP 89 96 100* 97 114*   Lipid Profile: No results for input(s): CHOL, HDL, LDLCALC, TRIG, CHOLHDL, LDLDIRECT in the last 72 hours. Thyroid Function Tests: No results for input(s): TSH, T4TOTAL, FREET4, T3FREE, THYROIDAB in the last 72 hours. Anemia Panel: No results for input(s): VITAMINB12, FOLATE, FERRITIN, TIBC, IRON, RETICCTPCT in the last 72 hours. Urine analysis:    Component Value Date/Time   COLORURINE YELLOW 11/10/2015 1800   APPEARANCEUR CLEAR 11/10/2015 1800   LABSPEC 1.012 11/10/2015 1800   PHURINE 5.5 11/10/2015 1800   GLUCOSEU NEGATIVE 11/10/2015 1800   GLUCOSEU NEGATIVE 05/05/2015 1135   HGBUR NEGATIVE 11/10/2015 1800   HGBUR negative 09/21/2009 1008    BILIRUBINUR NEGATIVE 11/10/2015 1800   KETONESUR NEGATIVE 11/10/2015 1800   PROTEINUR NEGATIVE 11/10/2015 1800   UROBILINOGEN 0.2 05/05/2015 1135   NITRITE NEGATIVE 11/10/2015 1800   LEUKOCYTESUR NEGATIVE 11/10/2015  1800   Sepsis Labs: Invalid input(s): PROCALCITONIN, LACTICIDVEN  Recent Results (from the past 240 hour(s))  C difficile quick scan w PCR reflex     Status: None   Collection Time: 07/17/16  6:22 PM  Result Value Ref Range Status   C Diff antigen NEGATIVE NEGATIVE Final   C Diff toxin NEGATIVE NEGATIVE Final   C Diff interpretation No C. difficile detected.  Final      Radiology Studies: No results found.   Scheduled Meds: . allopurinol  100 mg Oral Daily  . doxercalciferol      . doxercalciferol  2 mcg Intravenous Q T,Th,Sa-HD  . gabapentin  300 mg Oral QHS  . heparin  6,000 Units Dialysis Once in dialysis  . hydroxychloroquine  200 mg Oral Daily  . insulin aspart  0-5 Units Subcutaneous QHS  . insulin aspart  0-9 Units Subcutaneous TID WC  . multivitamin  1 tablet Oral QHS  . pantoprazole  40 mg Oral Q1200  . sevelamer carbonate  2,400 mg Oral TID WC  . Warfarin - Physician Dosing Inpatient   Does not apply q1800   Continuous Infusions:   Marzetta Board, MD, PhD [facility] Pager [phone]  If 7PM-7AM, please contact night-coverage [email or web address] Password [facility] 07/19/2016, 10:29 AM

## 2016-07-20 DIAGNOSIS — Z992 Dependence on renal dialysis: Secondary | ICD-10-CM

## 2016-07-20 DIAGNOSIS — N186 End stage renal disease: Secondary | ICD-10-CM

## 2016-07-20 LAB — CBC
HCT: 34.8 % — ABNORMAL LOW (ref 39.0–52.0)
Hemoglobin: 11.4 g/dL — ABNORMAL LOW (ref 13.0–17.0)
MCH: 30.5 pg (ref 26.0–34.0)
MCHC: 32.8 g/dL (ref 30.0–36.0)
MCV: 93 fL (ref 78.0–100.0)
PLATELETS: 159 10*3/uL (ref 150–400)
RBC: 3.74 MIL/uL — ABNORMAL LOW (ref 4.22–5.81)
RDW: 15.4 % (ref 11.5–15.5)
WBC: 7.7 10*3/uL (ref 4.0–10.5)

## 2016-07-20 LAB — BASIC METABOLIC PANEL
ANION GAP: 11 (ref 5–15)
BUN: 27 mg/dL — AB (ref 6–20)
CALCIUM: 9 mg/dL (ref 8.9–10.3)
CO2: 27 mmol/L (ref 22–32)
CREATININE: 6.74 mg/dL — AB (ref 0.61–1.24)
Chloride: 96 mmol/L — ABNORMAL LOW (ref 101–111)
GFR calc Af Amer: 8 mL/min — ABNORMAL LOW (ref >60)
GFR, EST NON AFRICAN AMERICAN: 7 mL/min — AB (ref >60)
GLUCOSE: 93 mg/dL (ref 65–99)
Potassium: 4.4 mmol/L (ref 3.5–5.1)
Sodium: 134 mmol/L — ABNORMAL LOW (ref 135–145)

## 2016-07-20 LAB — GLUCOSE, CAPILLARY
GLUCOSE-CAPILLARY: 91 mg/dL (ref 65–99)
GLUCOSE-CAPILLARY: 92 mg/dL (ref 65–99)
Glucose-Capillary: 84 mg/dL (ref 65–99)
Glucose-Capillary: 156 mg/dL — ABNORMAL HIGH (ref 65–99)

## 2016-07-20 LAB — PROTIME-INR
INR: 1.61
Prothrombin Time: 19.3 seconds — ABNORMAL HIGH (ref 11.4–15.2)

## 2016-07-20 NOTE — Care Management Important Message (Signed)
Important Message  Patient Details  Name: Bruce Cole MRN: [id number] Date of Birth: Dec 09, 1940   Medicare Important Message Given:  Yes    Dino Borntreger 07/20/2016, 11:18 AM

## 2016-07-20 NOTE — Progress Notes (Addendum)
[facility] Progress Note    Dialysis Orders: AFKCon TTS 107.5 kg 400/800 2K/ 2.25 Ca UF Profile 2, Linear Na Heparin 6000 units IV q tx Last Mircera 12/26 50 mcg IV, has been DC'd  Hectoral 2 mcg IV q tx  Assessment 1. Hyperkalemia: K+ >7.5 on admission Persistent hyperkalemia despite HD. Possible recirculation. S/p revision AVF 05/30/16 by Dr. Bridgett Larsson.  VVS consulted, for fistulogram today 1/3  to eval AVF. (canceled yesterday d/t INR >2.0)   Pt re instructed re dietary K restriction (was using salt sub and eating hi K foods). K+  4.4  Pt eating lunch and informs me his f'gram has been postponed again but neither of [place] know why  2. ESRD -T,Th,S. Cont on schedule HD tomorrow  3. Anemia - HGB 11.4 No OP ESA.  4. Secondary hyperparathyroidism - Cont binder, hectorol 5. HTN/volume -  BP's soft on HD (he says is usual). Pre weight 111.1 (EDW 107.5) Lungs clear/min edema. Post HD wt 108kg - will use as new EDW  6. Nutrition - Albumin 3.3. Renal/Carb mod diet. Reinforce fld restrictions. Nepro/Renal vits.  7. H/O chronic Afib: Slow VR. Pharmacy managing coumadin..  8. Diarrhea: H/O watery diarrhea prior to adm. Resolved.  9. DM: per primary  Plan - HD tomorrow ^ EDW , f'gram today, diet instruction   Ogechi Larina Earthly PA-C [place] Pager [phone] 07/20/2016,10:55 AM  I have seen and examined this patient and agree with plan and assessment in the above note with renal recommendations/intervention highlighted. F'gram postponed yet again - not sure why. INR is down to 1.6. Need some assurance that this will be done in the next 24 hours as is prolonging his hospitalization significantly.    Lord Lancour B,MD 07/20/2016 1:22 PM    Subjective:   Frustrated waiting for fistulogram today NPO overnight and hungry AVF cannulated without difficulty  On 1/2  K improved  Objective Vitals:   07/19/16 1200 07/19/16 1300 07/19/16 2059 07/20/16 0510  BP: (!)  98/59 92/63 (!) 99/57 (!) 107/58  Pulse: (!) 102 (!) 56 (!) 53 (!) 56  Resp:      Temp: 97.5 F (36.4 C) 97.5 F (36.4 C) 98.9 F (37.2 C) 98.3 F (36.8 C)  TempSrc: Oral Oral Oral Oral  SpO2: 90% 100% 99% 99%  Weight:      Height:       Physical Exam  BP (!) 107/58 (BP Location: Right Arm)   Pulse (!) 56   Temp 98.3 F (36.8 C) (Oral)   Resp 17   Ht 6\' 3"  (1.905 m)   Wt 108 kg (238 lb 1.6 oz)   SpO2 99%   BMI 29.76 kg/m   General : WNWD AAM NAD  Heart: U9,W1, 2/6 systolic M. Bradycardic 50's Lungs: CTAB A/P Abdomen: Active BS, non-tender Extremities: No LE edema Dialysis Access: LUA AVF +thrill/bruit  Labs:   Recent Labs Lab 07/16/16 0802 07/16/16 1157 07/17/16 0536  07/18/16 0412 07/19/16 0343 07/20/16 0430  NA 137 137 136  < > 133* 132* 134*  K 6.0* 5.3* 6.9*  < > 5.3* 3.8 4.4  CL 101 97* 97*  < > 94* 91* 96*  CO2 24 28 24   < > 26 27 27   GLUCOSE 115* 134* 95  < > 92 114* 93  BUN 35* 25* 38*  < > 32* 46* 27*  CREATININE 5.90* 5.83* 7.99*  < > 6.93* 8.82* 6.74*  CALCIUM 9.3 9.1 9.3  < >  9.1 8.6* 9.0  PHOS 2.8 3.8 6.0*  --   --   --   --   < > = values in this interval not displayed.   Recent Labs Lab 07/16/16 0255 07/16/16 0802 07/16/16 1157  AST 28  --   --   ALT 17  --   --   ALKPHOS 137*  --   --   BILITOT 0.6  --   --   PROT 7.9  --   --   ALBUMIN 3.7 3.9 3.3*     Recent Labs Lab 07/16/16 0255 07/16/16 1354 07/17/16 0536 07/18/16 0412 07/19/16 0343 07/20/16 0430  WBC 9.8 9.0 7.9 7.0 7.4 7.7  NEUTROABS 8.1*  --   --   --   --   --   HGB 12.0* 10.6* 10.6* 10.5* 10.8* 11.4*  HCT 37.7* 34.0* 34.1* 33.6* 33.1* 34.8*  MCV 96.7 95.5 95.3 94.4 92.5 93.0  PLT 165 146* 148* 140* 147* 159     Recent Labs Lab 07/16/16 0255  TROPONINI 0.07*     Recent Labs Lab 07/19/16 1213 07/19/16 1216 07/19/16 1754 07/19/16 2115 07/20/16 0750  GLUCAP 254* 78 102* 123* 91   Medications:  . allopurinol  100 mg Oral Daily  .  doxercalciferol  2 mcg Intravenous Q T,Th,Sa-HD  . gabapentin  300 mg Oral QHS  . hydroxychloroquine  200 mg Oral Daily  . insulin aspart  0-5 Units Subcutaneous QHS  . insulin aspart  0-9 Units Subcutaneous TID WC  . multivitamin  1 tablet Oral QHS  . pantoprazole  40 mg Oral Q1200  . sevelamer carbonate  2,400 mg Oral TID WC  . Warfarin - Physician Dosing Inpatient   Does not apply [phone]

## 2016-07-20 NOTE — Progress Notes (Addendum)
[facility] Progress Note  Patient: Bruce Cole GYJ:[id number]   PCP: Nance Pear., NP DOB: 06-20-1941   DOA: 07/16/2016   DOS: 07/20/2016   Date of Service: the patient was seen and examined on 07/20/2016  Brief hospital course: Pt. with PMH of A. fib on warfarin, CHF, ESRD, GERD, HTN, type II DM; admitted on 07/16/2016, with complaint of generalized weakness, diarrhea, shortness of breath, was found to have hyperkalemia as well as volume overload. Underwent urgent hemodialysis. Vascular surgery consulted as there was a concern that the AV fistula was not working, Currently further plan is fistulogram today.  Assessment and Plan: ESRD with acute hyperkalemia with bradycardia and ECG changes - Nephrology following, he received dialysis and his potassium has improved and is now within normal limits. There are concerns for AVF malfunction and plan is for fistulogram later today per vascular surgery. I will discuss with nephrology regarding discharge planning  Chronic combined systolic and diastolic heart failure  - Fluid management per nephrology, he appears euvolemic on exam  Chronic atrial fibrillation - CHADSVASC score at least 2, continue Coumadin, INR less than 2. Coumadin restart postprocedure  Diarrhea - Resolved, C diff was negative  Chronic anemia  - stable, in the setting of end-stage renal disease  Diabetes  - SSI, CBGs 80-90s - Hemoglobin A1c 5.5  Bradycardia. Will check EKG.  Bowel regimen: last BM 07/19/2016 Diet: renal diet DVT Prophylaxis: subcutaneous Heparin, warfarin  Advance goals of care discussion: full code  Family Communication: no family was present at bedside, at the time of interview.  Disposition:  Discharge to home Expected discharge date: 07/20/2016 vs 07/21/2016.  Consultants: Nephrology, vascular surgery Procedures: HD, fistulogram  Antibiotics: Anti-infectives    Start     Dose/Rate Route Frequency Ordered Stop   07/16/16 1200  hydroxychloroquine (PLAQUENIL) tablet 200 mg     200 mg Oral Daily 07/16/16 0433        Subjective: Feeling better, frustrated with him being nothing by mouth  Objective: Physical Exam: Vitals:   07/19/16 1200 07/19/16 1300 07/19/16 2059 07/20/16 0510  BP: (!) 98/59 92/63 (!) 99/57 (!) 107/58  Pulse: (!) 102 (!) 56 (!) 53 (!) 56  Resp:      Temp: 97.5 F (36.4 C) 97.5 F (36.4 C) 98.9 F (37.2 C) 98.3 F (36.8 C)  TempSrc: Oral Oral Oral Oral  SpO2: 90% 100% 99% 99%  Weight:      Height:        Intake/Output Summary (Last 24 hours) at 07/20/16 1143 Last data filed at 07/19/16 1700  Gross per 24 hour  Intake              420 ml  Output                0 ml  Net              420 ml   Filed Weights   07/18/16 0635 07/19/16 0715 07/19/16 1122  Weight: 110.9 kg (244 lb 7.8 oz) 111.1 kg (244 lb 14.9 oz) 108 kg (238 lb 1.6 oz)    General: Alert, Awake and Oriented to Time, Place and Person. Appear in mild distress, affect appropriate Eyes: PERRL, Conjunctiva normal ENT: Oral Mucosa clear moist. Neck: no JVD, no Abnormal Mass Or lumps Cardiovascular: S1 and S2 Present, aortic systolic Murmur, Respiratory: Bilateral Air entry equal and Decreased, no use of accessory muscle, Clear to Auscultation, no Crackles, no wheezes Abdomen: Bowel Sound present, Soft and  no tenderness Skin: no redness, no Rash, no induration Extremities: trave Pedal edema, no calf tenderness Neurologic: Grossly no focal neuro deficit. Bilaterally Equal motor strength  Data Reviewed: CBC:  Recent Labs Lab 07/16/16 0255 07/16/16 1354 07/17/16 0536 07/18/16 0412 07/19/16 0343 07/20/16 0430  WBC 9.8 9.0 7.9 7.0 7.4 7.7  NEUTROABS 8.1*  --   --   --   --   --   HGB 12.0* 10.6* 10.6* 10.5* 10.8* 11.4*  HCT 37.7* 34.0* 34.1* 33.6* 33.1* 34.8*  MCV 96.7 95.5 95.3 94.4 92.5 93.0  PLT 165 146* 148* 140* 147* 161   Basic Metabolic Panel:  Recent Labs Lab 07/16/16 0255 07/16/16 0802  07/16/16 1157 07/17/16 0536 07/17/16 1436 07/17/16 1829 07/18/16 0412 07/19/16 0343 07/20/16 0430  NA 136 137 137 136 132* 134* 133* 132* 134*  K >7.5* 6.0* 5.3* 6.9* 4.6 5.0 5.3* 3.8 4.4  CL 108 101 97* 97* 94* 94* 94* 91* 96*  CO2 16* 24 28 24 28 27 26 27 27   GLUCOSE 139* 115* 134* 95 126* 139* 92 114* 93  BUN 67* 35* 25* 38* 23* 24* 32* 46* 27*  CREATININE 9.90* 5.90* 5.83* 7.99* 5.28* 6.03* 6.93* 8.82* 6.74*  CALCIUM 9.2 9.3 9.1 9.3 9.1 9.2 9.1 8.6* 9.0  MG 2.7*  --   --  2.4  --   --   --   --   --   PHOS  --  2.8 3.8 6.0*  --   --   --   --   --     Liver Function Tests:  Recent Labs Lab 07/16/16 0255 07/16/16 0802 07/16/16 1157  AST 28  --   --   ALT 17  --   --   ALKPHOS 137*  --   --   BILITOT 0.6  --   --   PROT 7.9  --   --   ALBUMIN 3.7 3.9 3.3*   No results for input(s): LIPASE, AMYLASE in the last 168 hours. No results for input(s): AMMONIA in the last 168 hours. Coagulation Profile:  Recent Labs Lab 07/16/16 0255 07/17/16 0536 07/18/16 0412 07/19/16 0343 07/20/16 0847  INR 2.78 2.12 2.11 2.07 1.61   Cardiac Enzymes:  Recent Labs Lab 07/16/16 0255  TROPONINI 0.07*   BNP (last 3 results) No results for input(s): PROBNP in the last 8760 hours.  CBG:  Recent Labs Lab 07/19/16 1213 07/19/16 1216 07/19/16 1754 07/19/16 2115 07/20/16 0750  GLUCAP 254* 78 102* 123* 91    Studies: No results found.   Scheduled Meds: . allopurinol  100 mg Oral Daily  . doxercalciferol  2 mcg Intravenous Q T,Th,Sa-HD  . gabapentin  300 mg Oral QHS  . hydroxychloroquine  200 mg Oral Daily  . insulin aspart  0-5 Units Subcutaneous QHS  . insulin aspart  0-9 Units Subcutaneous TID WC  . multivitamin  1 tablet Oral QHS  . pantoprazole  40 mg Oral Q1200  . sevelamer carbonate  2,400 mg Oral TID WC  . Warfarin - Physician Dosing Inpatient   Does not apply q1800   Continuous Infusions: PRN Meds: sodium chloride, HYDROcodone-acetaminophen  Time spent:  30 minutes  Author: Berle Mull, MD [facility] Pager: [phone] 07/20/2016 11:43 AM  If 7PM-7AM, please contact night-coverage at [email or web address], password [facility]

## 2016-07-21 ENCOUNTER — Encounter (HOSPITAL_COMMUNITY)
Admission: EM | Disposition: A | Discharge status: Home / Self Care | Payer: Self-pay | Source: Home / Self Care | Attending: Internal Medicine

## 2016-07-21 ENCOUNTER — Encounter (HOSPITAL_COMMUNITY): Payer: Self-pay | Admitting: Vascular Surgery

## 2016-07-21 DIAGNOSIS — I482 Chronic atrial fibrillation: Secondary | ICD-10-CM

## 2016-07-21 DIAGNOSIS — T82898A Other specified complication of vascular prosthetic devices, implants and grafts, initial encounter: Secondary | ICD-10-CM

## 2016-07-21 HISTORY — PX: PERIPHERAL VASCULAR CATHETERIZATION: SHX172C

## 2016-07-21 LAB — RENAL FUNCTION PANEL
ALBUMIN: 3.3 g/dL — AB (ref 3.5–5.0)
Anion gap: 14 (ref 5–15)
BUN: 47 mg/dL — AB (ref 6–20)
CALCIUM: 8.8 mg/dL — AB (ref 8.9–10.3)
CO2: 23 mmol/L (ref 22–32)
CREATININE: 9.49 mg/dL — AB (ref 0.61–1.24)
Chloride: 94 mmol/L — ABNORMAL LOW (ref 101–111)
GFR calc Af Amer: 6 mL/min — ABNORMAL LOW (ref >60)
GFR, EST NON AFRICAN AMERICAN: 5 mL/min — AB (ref >60)
Glucose, Bld: 93 mg/dL (ref 65–99)
POTASSIUM: 4.5 mmol/L (ref 3.5–5.1)
Phosphorus: 6.2 mg/dL — ABNORMAL HIGH (ref 2.5–4.6)
Sodium: 131 mmol/L — ABNORMAL LOW (ref 135–145)

## 2016-07-21 LAB — GLUCOSE, CAPILLARY
GLUCOSE-CAPILLARY: 94 mg/dL (ref 65–99)
Glucose-Capillary: 123 mg/dL — ABNORMAL HIGH (ref 65–99)
Glucose-Capillary: 113 mg/dL — ABNORMAL HIGH (ref 65–99)

## 2016-07-21 LAB — PROTIME-INR
INR: 1.4
Prothrombin Time: 17.2 seconds — ABNORMAL HIGH (ref 11.4–15.2)

## 2016-07-21 SURGERY — A/V FISTULAGRAM
Anesthesia: LOCAL | Laterality: Left

## 2016-07-21 MED ORDER — PENTAFLUOROPROP-TETRAFLUOROETH EX AERO
1.0000 | INHALATION_SPRAY | CUTANEOUS | Status: DC | PRN
Start: 2016-07-21 — End: 2016-07-21

## 2016-07-21 MED ORDER — HEPARIN SODIUM (PORCINE) 5000 UNIT/ML IJ SOLN: 5000.0000 [IU] | Freq: Three times a day (TID) | INTRAMUSCULAR | Status: DC

## 2016-07-21 MED ORDER — DOXERCALCIFEROL 4 MCG/2ML IV SOLN
INTRAVENOUS | Status: AC
Start: 2016-07-21 — End: 2016-07-21
  Filled 2016-07-21: qty 2

## 2016-07-21 MED ORDER — HEPARIN (PORCINE) IN NACL 2-0.9 UNIT/ML-% IJ SOLN
INTRAMUSCULAR | Status: AC
  Filled 2016-07-21: qty 500

## 2016-07-21 MED ORDER — LIDOCAINE HCL (PF) 1 % IJ SOLN
INTRAMUSCULAR | Status: AC
  Filled 2016-07-21: qty 30

## 2016-07-21 MED ORDER — SODIUM CHLORIDE 0.9 % IV SOLN: 100.0000 mL | INTRAVENOUS | Status: DC | PRN

## 2016-07-21 MED ORDER — IODIXANOL 320 MG/ML IV SOLN
INTRAVENOUS | Status: DC | PRN
Start: 2016-07-21 — End: 2016-07-21
  Administered 2016-07-21: 35 mL via INTRAVENOUS

## 2016-07-21 MED ORDER — LIDOCAINE HCL (PF) 1 % IJ SOLN: 5.0000 mL | INTRAMUSCULAR | Status: DC | PRN

## 2016-07-21 MED ORDER — LIDOCAINE HCL (PF) 1 % IJ SOLN
INTRAMUSCULAR | Status: DC | PRN
  Administered 2016-07-21: 2 mL

## 2016-07-21 MED ORDER — HEPARIN (PORCINE) IN NACL 2-0.9 UNIT/ML-% IJ SOLN
INTRAMUSCULAR | Status: DC | PRN
  Administered 2016-07-21: 500 mL

## 2016-07-21 MED ORDER — LIDOCAINE-PRILOCAINE 2.5-2.5 % EX CREA
1.0000 | TOPICAL_CREAM | CUTANEOUS | Status: DC | PRN
Start: 2016-07-21 — End: 2016-07-21

## 2016-07-21 MED ORDER — HEPARIN SODIUM (PORCINE) 1000 UNIT/ML DIALYSIS: 1000.0000 [IU] | INTRAMUSCULAR | Status: DC | PRN

## 2016-07-21 MED ORDER — HEPARIN SODIUM (PORCINE) 1000 UNIT/ML DIALYSIS
6000.0000 [IU] | Freq: Once | INTRAMUSCULAR | Status: DC
  Filled 2016-07-21: qty 6

## 2016-07-21 SURGICAL SUPPLY — 10 items
BAG SNAP BAND KOVER 36X36 (MISCELLANEOUS) ×2 IMPLANT
COVER DOME SNAP 22 D (MISCELLANEOUS) ×2 IMPLANT
COVER PRB 48X5XTLSCP FOLD TPE (BAG) ×1 IMPLANT
COVER PROBE 5X48 (BAG) ×1
KIT MICROINTRODUCER STIFF 5F (SHEATH) ×4 IMPLANT
PROTECTION STATION PRESSURIZED (MISCELLANEOUS) ×2
STATION PROTECTION PRESSURIZED (MISCELLANEOUS) ×1 IMPLANT
STOPCOCK MORSE 400PSI 3WAY (MISCELLANEOUS) ×2 IMPLANT
TRAY PV CATH (CUSTOM PROCEDURE TRAY) ×2 IMPLANT
TUBING CIL FLEX 10 FLL-RA (TUBING) ×2 IMPLANT

## 2016-07-21 NOTE — Progress Notes (Signed)
[facility] Progress Note  Patient: Bruce Cole EZM:[id number]   PCP: Nance Pear., NP DOB: 1940-12-08   DOA: 07/16/2016   DOS: 07/21/2016   Date of Service: the patient was seen and examined on 07/21/2016  Brief hospital course: Pt. with PMH of A. fib on warfarin, CHF, ESRD, GERD, HTN, type II DM; admitted on 07/16/2016, with complaint of generalized weakness, diarrhea, shortness of breath, was found to have hyperkalemia as well as volume overload. Underwent urgent hemodialysis. Vascular surgery consulted as there was a concern that the AV fistula was not working,Underwent fistulogram which shows decreased flow from the basilic vein Currently further plan is Revision of AV fistula tomorrow  Assessment and Plan: ESRD with acute hyperkalemia with bradycardia and ECG changes - Nephrology following, he received dialysis and his potassium has improved and is now within normal limits. There are concerns for AVF malfunction and plan is revision of the AV fistula on 07/22/2016.  Chronic combined systolic and diastolic heart failure  - Fluid management per nephrology, he appears euvolemic on exam  Chronic atrial fibrillation - CHADSVASC score at least 2, continue Coumadin, INR less than 2. Coumadin restart postprocedure  Diarrhea - Resolved, C diff was negative  Chronic anemia  - stable, in the setting of end-stage renal disease  Diabetes  - SSI, CBGs 80-90s - Hemoglobin A1c 5.5  Bradycardia. Junctional rhythm on EKG, patient remained asymptomatic. Continue to monitor at present.   Bowel regimen: last BM 07/19/2016 Diet: renal diet DVT Prophylaxis: subcutaneous Heparin, warfarin  Advance goals of care discussion: full code  Family Communication: no family was present at bedside, at the time of interview.  Disposition:  Discharge to home Expected discharge date:  07/23/2016.  Consultants: Nephrology, vascular surgery Procedures: HD,  fistulogram  Antibiotics: Anti-infectives    Start     Dose/Rate Route Frequency Ordered Stop   07/16/16 1200  hydroxychloroquine (PLAQUENIL) tablet 200 mg     200 mg Oral Daily 07/16/16 0433        Subjective: No acute complaint, tolerated fistulogram well. No acute events overnight.  Objective: Physical Exam: Vitals:   07/21/16 1700 07/21/16 1730 07/21/16 1740 07/21/16 1825  BP: 98/64 93/60 109/66 109/66  Pulse: (!) 51 (!) 53 (!) 51 (!) 52  Resp: 16 18 20 20   Temp:   98.1 F (36.7 C) 98.2 F (36.8 C)  TempSrc:   Oral Oral  SpO2:   98% 98%  Weight:   108 kg (238 lb 1.6 oz)   Height:        Intake/Output Summary (Last 24 hours) at 07/21/16 2022 Last data filed at 07/21/16 1939  Gross per 24 hour  Intake              980 ml  Output             3400 ml  Net            -2420 ml   Filed Weights   07/21/16 0500 07/21/16 1338 07/21/16 1740  Weight: 105.5 kg (232 lb 9.6 oz) 111.2 kg (245 lb 2.4 oz) 108 kg (238 lb 1.6 oz)    General: Alert, Awake and Oriented to Time, Place and Person. Appear in mild distress, affect appropriate Eyes: PERRL, Conjunctiva normal ENT: Oral Mucosa clear moist. Neck: no JVD, no Abnormal Mass Or lumps Cardiovascular: S1 and S2 Present, aortic systolic Murmur, Respiratory: Bilateral Air entry equal and Decreased, no use of accessory muscle, Clear to Auscultation, no Crackles, no wheezes  Abdomen: Bowel Sound present, Soft and no tenderness Skin: no redness, no Rash, no induration Extremities: trave Pedal edema, no calf tenderness Neurologic: Grossly no focal neuro deficit. Bilaterally Equal motor strength  Data Reviewed: CBC:  Recent Labs Lab 07/16/16 0255 07/16/16 1354 07/17/16 0536 07/18/16 0412 07/19/16 0343 07/20/16 0430  WBC 9.8 9.0 7.9 7.0 7.4 7.7  NEUTROABS 8.1*  --   --   --   --   --   HGB 12.0* 10.6* 10.6* 10.5* 10.8* 11.4*  HCT 37.7* 34.0* 34.1* 33.6* 33.1* 34.8*  MCV 96.7 95.5 95.3 94.4 92.5 93.0  PLT 165 146* 148* 140*  147* 161   Basic Metabolic Panel:  Recent Labs Lab 07/16/16 0255 07/16/16 0802 07/16/16 1157 07/17/16 0536  07/17/16 1829 07/18/16 0412 07/19/16 0343 07/20/16 0430 07/21/16 1500  NA 136 137 137 136  < > 134* 133* 132* 134* 131*  K >7.5* 6.0* 5.3* 6.9*  < > 5.0 5.3* 3.8 4.4 4.5  CL 108 101 97* 97*  < > 94* 94* 91* 96* 94*  CO2 16* 24 28 24   < > 27 26 27 27 23   GLUCOSE 139* 115* 134* 95  < > 139* 92 114* 93 93  BUN 67* 35* 25* 38*  < > 24* 32* 46* 27* 47*  CREATININE 9.90* 5.90* 5.83* 7.99*  < > 6.03* 6.93* 8.82* 6.74* 9.49*  CALCIUM 9.2 9.3 9.1 9.3  < > 9.2 9.1 8.6* 9.0 8.8*  MG 2.7*  --   --  2.4  --   --   --   --   --   --   PHOS  --  2.8 3.8 6.0*  --   --   --   --   --  6.2*  < > = values in this interval not displayed.  Liver Function Tests:  Recent Labs Lab 07/16/16 0255 07/16/16 0802 07/16/16 1157 07/21/16 1500  AST 28  --   --   --   ALT 17  --   --   --   ALKPHOS 137*  --   --   --   BILITOT 0.6  --   --   --   PROT 7.9  --   --   --   ALBUMIN 3.7 3.9 3.3* 3.3*   No results for input(s): LIPASE, AMYLASE in the last 168 hours. No results for input(s): AMMONIA in the last 168 hours. Coagulation Profile:  Recent Labs Lab 07/17/16 0536 07/18/16 0412 07/19/16 0343 07/20/16 0847 07/21/16 0557  INR 2.12 2.11 2.07 1.61 1.40   Cardiac Enzymes:  Recent Labs Lab 07/16/16 0255  TROPONINI 0.07*   BNP (last 3 results) No results for input(s): PROBNP in the last 8760 hours.  CBG:  Recent Labs Lab 07/20/16 1718 07/20/16 2056 07/21/16 0724 07/21/16 1134 07/21/16 2006  GLUCAP 156* 92 94 123* 113*    Studies: No results found.   Scheduled Meds: . allopurinol  100 mg Oral Daily  . doxercalciferol  2 mcg Intravenous Q T,Th,Sa-HD  . gabapentin  300 mg Oral QHS  . hydroxychloroquine  200 mg Oral Daily  . insulin aspart  0-5 Units Subcutaneous QHS  . insulin aspart  0-9 Units Subcutaneous TID WC  . multivitamin  1 tablet Oral QHS  .  pantoprazole  40 mg Oral Q1200  . sevelamer carbonate  2,400 mg Oral TID WC   Continuous Infusions: PRN Meds: sodium chloride, HYDROcodone-acetaminophen  Time spent: 30 minutes  Author: Berle Mull,  MD [facility] Pager: [phone] 07/21/2016 8:22 PM  If 7PM-7AM, please contact night-coverage at [email or web address], password [facility]

## 2016-07-21 NOTE — H&P (Signed)
Brief History and Physical  History of Present Illness  Bruce Cole is a 76 y.o. male who presents with chief complaint: difficulty cannulating L arm BVT.  The patient presents today for L arm fistulogram.    Past Medical History:  Diagnosis Date  . Acute blood loss anemia   . Atrial fibrillation (HCC)    Chronic  . Chronic combined systolic and diastolic heart failure ([place])   . Colon polyp 2000  . Dysrhythmia    hx  . ESRD on hemodialysis ([place])    [place]  . Essential hypertension   . Gastritis and gastroduodenitis   . GI bleed   . Gout   . Heart murmur   . History of blood transfusion   . History of kidney stones   . Nephrolithiasis   . OSA (obstructive sleep apnea) 09/02/2013    IMPRESSION :  1. Mild obstructive sleep apnea with hypopneas causing sleep fragmentation and moderate oxygen desaturation.  2. Short runs of nonsustained VT were noted. His beta blocker may need to be titrated 3. Significant PLM's were noted, the PLM arousal index was low. Please correlate with clinical history of restless leg syndrome.  4. Sleep efficiency was poor.  RECOMMENDATION:  1. Treatment options for this degree of sleep disordered breathing include weight loss and positional therapy to avoid supine sleep 2. Consider titrating beta blocker further, defer to cardiologist 3. Patient should be cautioned against driving when sleepy.They should be asked to avoid medications with sedative side effects     . Osteoarthritis of right hip   . Pneumonia    hx 30 yrs ago  . Primary osteoarthritis of right hip   . Rheumatoid arthritis ([place])   . Thrombocytopenia ([place])     Past Surgical History:  Procedure Laterality Date  . AV FISTULA PLACEMENT Left 08/26/2015   Procedure: LEFT RADIOCEPHALIC FISTULA CREATION;  Surgeon: Rosetta Posner, MD;  Location: [place];  Service: Vascular;  Laterality: Left;  . AV FISTULA PLACEMENT Left 11/23/2015   Procedure: ARTERIOVENOUS (AV) FISTULA  CREATION;  Surgeon: Rosetta Posner, MD;  Location: [place];  Service: Vascular;  Laterality: Left;  . BACK SURGERY     x2- discectomy  . CHOLECYSTECTOMY  1994  . CYSTOSCOPY/RETROGRADE/URETEROSCOPY/STONE EXTRACTION WITH BASKET    . ESOPHAGOGASTRODUODENOSCOPY N/A 11/13/2015   Procedure: ESOPHAGOGASTRODUODENOSCOPY (EGD);  Surgeon: Irene Shipper, MD;  Location: [facility] ENDOSCOPY;  Service: Endoscopy;  Laterality: N/A;  . INSERTION OF DIALYSIS CATHETER Left 11/23/2015   Procedure: INSERTION OF DIALYSIS CATHETER;  Surgeon: Rosetta Posner, MD;  Location: [place];  Service: Vascular;  Laterality: Left;  . JOINT REPLACEMENT     Total L-Hip replacement, Right Knee 10/20/09  . LEFT AND RIGHT HEART CATHETERIZATION WITH CORONARY ANGIOGRAM N/A 02/22/2013   Procedure: LEFT AND RIGHT HEART CATHETERIZATION WITH CORONARY ANGIOGRAM;  Surgeon: Jolaine Artist, MD;  Location[facility] CATH LAB;  Service: Cardiovascular;  Laterality: N/A;  . LITHOTRIPSY  90's  . PERIPHERAL VASCULAR CATHETERIZATION Left 11/19/2015   Procedure: A/V/Fistulagram;  Surgeon: Conrad Northport, MD;  Location: [place] CV LAB;  Service: Cardiovascular;  Laterality: Left;  . REVISON OF ARTERIOVENOUS FISTULA Left 05/30/2016   Procedure: REVISION LEFT UPPER ARM FISTULA;  Surgeon: Conrad Brawley, MD;  Location: [place];  Service: Vascular;  Laterality: Left;  . SPINE SURGERY     x 2  . TOTAL HIP ARTHROPLASTY Right 03/22/2016   Procedure: RIGHT TOTAL HIP ARTHROPLASTY ANTERIOR APPROACH;  Surgeon: Mcarthur Rossetti, MD;  Location: [place];  Service: Orthopedics;  Laterality: Right;    Social History   Social History  . Marital status: Single    Spouse name: N/A  . Number of children: 3  . Years of education: N/A   Occupational History  . retired Programmer, multimedia  Retired   Social History Main Topics  . Smoking status: Never Smoker  . Smokeless tobacco: Never Used  . Alcohol use No     Comment: occasional  . Drug use: No  . Sexual activity: Not on file    Other Topics Concern  . Not on file   Social History Narrative   Former [place] [employer]   Admitted to [facility] 11/25/15   Widowed   Never smoked   FULL CODE    Family History  Problem Relation Age of Onset  . Hypertension Mother   . Arthritis Mother     ?RA  . Hypertension Father     No current facility-administered medications on file prior to encounter.    Current Outpatient Prescriptions on File Prior to Encounter  Medication Sig Dispense Refill  . acetaminophen (TYLENOL) 500 MG tablet Take 500 mg by mouth every 6 (six) hours as needed for mild pain.    [name] albuterol (PROVENTIL) (2.5 MG/3ML) 0.083% nebulizer solution Take 2.5 mg by nebulization every 4 (four) hours as needed for shortness of breath. Reported on 09/25/2015    . allopurinol (ZYLOPRIM) 100 MG tablet Take 2 tablets (200 mg total) by mouth daily. 30 tablet 0  . B Complex-C-Folic Acid (RENA-VITE RX) 1 MG TABS Take 1 tablet by mouth at bedtime.   6  . fluticasone (FLONASE) 50 MCG/ACT nasal spray Place 2 sprays into both nostrils daily. (Patient taking differently: Place 2 sprays into both nostrils daily as needed for allergies. ) 16 g 6  . furosemide (LASIX) 80 MG tablet Take 80mg s daily on  Mondays, Wednesdays, Fridays, and Sundays    . gabapentin (NEURONTIN) 300 MG capsule Take 1 capsule (300 mg total) by mouth at bedtime. 30 capsule 3  . HYDROcodone-acetaminophen (NORCO) 5-325 MG tablet Take 1 tablet by mouth every 6 (six) hours as needed for moderate pain. One to two tabs every 4-6 hours for pain 6 tablet 0  . hydroxychloroquine (PLAQUENIL) 200 MG tablet Take 200 mg by mouth 2 (two) times daily. Reported on 01/13/2016    . pantoprazole (PROTONIX) 40 MG tablet Take 1 tablet (40 mg total) by mouth 2 (two) times daily. (Patient taking differently: Take 40 mg by mouth 2 (two) times daily as needed (indigestion). ) 60 tablet 0  . polyethylene glycol (MIRALAX / GLYCOLAX) packet Take 17 g by mouth  daily as needed for mild constipation.     [name] RENVELA 800 MG tablet Take 800 mg by mouth 3 (three) times daily with meals.   0  . warfarin (COUMADIN) 7.5 MG tablet TAKE 1 TABLET BY MOUTH DAILY AS DIRECTED BY COUMADIN CLINIC (Patient not taking: Reported on 07/16/2016) 40 tablet 3    Allergies  Allergen Reactions  . Ace Inhibitors Other (See Comments)    Worsening renal insufficiency    Review of Systems: As listed above, otherwise negative.  Physical Examination  Vitals:   07/20/16 1645 07/20/16 2102 07/21/16 0435 07/21/16 0500  BP:  97/62 105/63   Pulse:  (!) 52 (!) 50   Resp:  14 14   Temp:  98.5 F (36.9 C) 98.4 F (  36.9 C)   TempSrc:  Oral Oral   SpO2:  100% 99%   Weight: 237 lb 3.2 oz (107.6 kg)   232 lb 9.6 oz (105.5 kg)  Height:        General: A&O x 3, WDWN  Pulmonary: Sym exp, good air movt, CTAB, no rales, rhonchi, & wheezing  Cardiac: RRR, Nl S1, S2, no Murmurs, rubs or gallops  Gastrointestinal: soft, NTND, -G/R, - HSM, - masses, - CVAT B  Musculoskeletal: M/S 5/5 throughout , Extremities without ischemic changes , palpable thrill in L arm  Laboratory: CBC:    Component Value Date/Time   WBC 7.7 07/20/2016 0430   RBC 3.74 (L) 07/20/2016 0430   HGB 11.4 (L) 07/20/2016 0430   HCT 34.8 (L) 07/20/2016 0430   PLT 159 07/20/2016 0430   MCV 93.0 07/20/2016 0430   MCH 30.5 07/20/2016 0430   MCHC 32.8 07/20/2016 0430   RDW 15.4 07/20/2016 0430   LYMPHSABS 1.0 07/16/2016 0255   MONOABS 0.6 07/16/2016 0255   EOSABS 0.1 07/16/2016 0255   BASOSABS 0.0 07/16/2016 0255    BMP:    Component Value Date/Time   NA 134 (L) 07/20/2016 0430   NA 137 04/08/2016   K 4.4 07/20/2016 0430   CL 96 (L) 07/20/2016 0430   CO2 27 07/20/2016 0430   GLUCOSE 93 07/20/2016 0430   BUN 27 (H) 07/20/2016 0430   BUN 38 (A) 04/08/2016   CREATININE 6.74 (H) 07/20/2016 0430   CREATININE 1.77 (H) 02/13/2013 1505   CALCIUM 9.0 07/20/2016 0430   GFRNONAA 7 (L) 07/20/2016 0430    GFRAA 8 (L) 07/20/2016 0430    Coagulation: Lab Results  Component Value Date   INR 1.40 07/21/2016   INR 1.61 07/20/2016   INR 2.07 07/19/2016   No results found for: PTT  Lipids:    Component Value Date/Time   CHOL 169 02/03/2016 1132   TRIG 58.0 02/03/2016 1132   HDL 87.90 02/03/2016 1132   CHOLHDL 2 02/03/2016 1132   VLDL 11.6 02/03/2016 1132   [place] 69 02/03/2016 1132     Medical Decision Making  [place] is a 76 y.o. male who presents with: difficulty cannulating L BVT   The patient is scheduled for: L arm fistulogram, possible intervention.  I discussed with the patient the nature of angiographic procedures, especially the limited patencies of any endovascular intervention.  The patient is aware of that the risks of an angiographic procedure include but are not limited to: bleeding, infection, access site complications, renal failure, embolization, rupture of vessel, dissection, possible need for emergent surgical intervention, possible need for surgical procedures to treat the patient's pathology, and stroke and death.    The patient is aware of the risks and agrees to proceed.  Adele Barthel, MD [facility] [place] Office: [phone] Pager: [phone]  07/21/2016, 7:50 AM

## 2016-07-21 NOTE — Op Note (Signed)
    OPERATIVE NOTE   PROCEDURE: 1. Left basilic vein transposition cannulation under ultrasound guidance 2. Left arm fistulogram  PRE-OPERATIVE DIAGNOSIS: Decreased flow issues with basilic vein transposition   POST-OPERATIVE DIAGNOSIS: same as above   SURGEON: Adele Barthel, MD  ANESTHESIA: local  ESTIMATED BLOOD LOSS: 5 cc  FINDING(S): 1. Dynamic collapse of distal fistula lumen visualized under ultrasound guidance 2. Patent left basilic vein transposition with small pseudoaneurysms 3. Distal fistula has widely patent anastomosis but severe stenosis of perianastomotic segment (down to microsheath diameter)  SPECIMEN(S):  None  CONTRAST: 35 cc  INDICATIONS: Bruce Cole is a 76 y.o. male who  presents with recurrent poor flow rates.  A prior fistulogram had lead to revision of the anastomosis in November 2017.  He had resolution of his issues with that procedure.  He is getting recurrent flow rate issues in this fistula though he is completing his dialysis runs.  The patient is scheduled for left arm fistulogram.  The patient is aware the risks include but are not limited to: bleeding, infection, thrombosis of the cannulated access, and possible anaphylactic reaction to the contrast.  The patient is aware of the risks of the procedure and elects to proceed forward.  DESCRIPTION: After full informed written consent was obtained, the patient was brought back to the angiography suite and placed supine upon the angiography table.  The patient was connected to monitoring equipment.  The left upper arm was prepped and draped in the standard fashion for a left arm fistulogram.  Under ultrasound guidance, the left basilic vein transposition was visualized.  I could see the lumen of the distal fistula intermittently collapsing under ultrasound visualization.  I injected 2 cc of 1% lidocaine without epinephrine over the distal fistula.  I could not easily cannulate the distal fistula so I  moved proximal to the area of dynamic collapse and cannulated the distal fistula with a micropuncture needle.  The microwire was advanced into the fistula and the needle was exchanged for the a microsheath, which was lodged 2 cm into the access.  The wire was removed and the sheath was connected to the IV extension tubing.  Hand injections were completed to image the access from the antecubitum up to the level of axilla.  The central venous structures were also imaged by hand injections.  Based on the images, this patient will need: revision of the anastomosis of this left basilic vein transposition for swing segment stenosis.  A 4-0 Monocryl purse-string suture was sewn around the sheath.  The sheath was removed while tying down the suture.  A sterile bandage was applied to the puncture site.  COMPLICATIONS: none  CONDITION: stable   Adele Barthel, MD, [facility] [place] Office: [phone] Pager: [phone]  07/21/2016 8:36 AM

## 2016-07-21 NOTE — Progress Notes (Signed)
[facility] Progress Note    Dialysis Orders: AFKCon TTS 107.5 kg 400/800 2K/ 2.25 Ca UF Profile 2, Linear Na Heparin 6000 units IV q tx Last Mircera 12/26 50 mcg IV, has been DC'd  Hectoral 2 mcg IV q tx  Assessment 1. Hyperkalemia: K+ >7.5 on admission Persistent hyperkalemia despite HD. Possible recirculation. S/p revision AVF 05/30/16 by Dr. Bridgett Larsson.  VVS consulted, for fistulogram to eval AVF.  Pt re instructed re dietary K restriction (was using salt sub and eating hi K foods). K+  4.4   2. Fistulogram today with recurrent severe swing segment stenosis - recommend revision of L BVT tomorrow with Dr. Donnetta Hutching  3. ESRD -T,Th,S. Cont on schedule HD today 4. Anemia - HGB 11.4 No OP ESA.  5. Secondary hyperparathyroidism - Cont binder, hectorol 6. HTN/volume -  BP's soft on HD (he says is usual). Pre weight 111.1 (EDW 107.5) Lungs clear/min edema. Post HD wt 108kg - will use as new EDW  7. Nutrition - Albumin 3.3. Renal/Carb mod diet. Reinforce fld restrictions. Nepro/Renal vits.  8. H/O chronic Afib: Slow VR. Pharmacy managing coumadin..  9. Diarrhea: H/O watery diarrhea prior to adm. Resolved.  10. DM: per primary  Plan - HD today ^ EDW , diet instruction, For revision of L BVT tomorrow   Lynnda Child PA-C [facility] Pager [phone] 07/21/2016,9:51 AM   Subjective:   Resting in bed eating  s/p fistulogram this am Has had multiple interventions to fistula over past year  No c/os  K improved  Objective Vitals:   07/21/16 0815 07/21/16 0820 07/21/16 0825 07/21/16 0845  BP: (!) 96/28 (!) 102/26 (!) 101/35 96/60  Pulse: (!) 49 (!) 51 (!) 51 (!) 56  Resp: 19 11 10 17   Temp:    98.6 F (37 C)  TempSrc:    Oral  SpO2: 98% 99% 100% 100%  Weight:      Height:       Physical Exam  BP 96/60 (BP Location: Right Arm)   Pulse (!) 56   Temp 98.6 F (37 C) (Oral)   Resp 17   Ht 6\' 3"  (1.905 m)   Wt 105.5 kg (232 lb 9.6 oz)   SpO2 100%    BMI 29.07 kg/m   General : WNWD AAM NAD  Heart: R5,J8, 2/6 systolic M. Bradycardic 50's Lungs: CTAB A/P Abdomen: Active BS, non-tender Extremities: No LE edema Dialysis Access: LUA AVF +thrill/bruit  Labs:   Recent Labs Lab 07/16/16 0802 07/16/16 1157 07/17/16 0536  07/18/16 0412 07/19/16 0343 07/20/16 0430  NA 137 137 136  < > 133* 132* 134*  K 6.0* 5.3* 6.9*  < > 5.3* 3.8 4.4  CL 101 97* 97*  < > 94* 91* 96*  CO2 24 28 24   < > 26 27 27   GLUCOSE 115* 134* 95  < > 92 114* 93  BUN 35* 25* 38*  < > 32* 46* 27*  CREATININE 5.90* 5.83* 7.99*  < > 6.93* 8.82* 6.74*  CALCIUM 9.3 9.1 9.3  < > 9.1 8.6* 9.0  PHOS 2.8 3.8 6.0*  --   --   --   --   < > = values in this interval not displayed.   Recent Labs Lab 07/16/16 0255 07/16/16 0802 07/16/16 1157  AST 28  --   --   ALT 17  --   --   ALKPHOS 137*  --   --   BILITOT 0.6  --   --  PROT 7.9  --   --   ALBUMIN 3.7 3.9 3.3*     Recent Labs Lab 07/16/16 0255 07/16/16 1354 07/17/16 0536 07/18/16 0412 07/19/16 0343 07/20/16 0430  WBC 9.8 9.0 7.9 7.0 7.4 7.7  NEUTROABS 8.1*  --   --   --   --   --   HGB 12.0* 10.6* 10.6* 10.5* 10.8* 11.4*  HCT 37.7* 34.0* 34.1* 33.6* 33.1* 34.8*  MCV 96.7 95.5 95.3 94.4 92.5 93.0  PLT 165 146* 148* 140* 147* 159     Recent Labs Lab 07/16/16 0255  TROPONINI 0.07*     Recent Labs Lab 07/20/16 0750 07/20/16 1211 07/20/16 1718 07/20/16 2056 07/21/16 0724  GLUCAP 91 84 156* 92 94   Medications:  . allopurinol  100 mg Oral Daily  . doxercalciferol  2 mcg Intravenous Q T,Th,Sa-HD  . gabapentin  300 mg Oral QHS  . hydroxychloroquine  200 mg Oral Daily  . insulin aspart  0-5 Units Subcutaneous QHS  . insulin aspart  0-9 Units Subcutaneous TID WC  . multivitamin  1 tablet Oral QHS  . pantoprazole  40 mg Oral Q1200  . sevelamer carbonate  2,400 mg Oral TID WC  . Warfarin - Physician Dosing Inpatient   Does not apply [phone]

## 2016-07-21 NOTE — Progress Notes (Signed)
   Daily Progress Note  [name] fistulogram demonstrates recurrent severe swing segment stenosis.  I have found that perianastomotic stenoses have limited response to serial angioplasty.  I have recommended: revision of left basilic vein transposition anastomosis.  This will be scheduled for tomorrow with Dr. Donnetta Hutching.  Adele Barthel, MD, FACS [facility] [place] Office: [phone] Pager: [phone]  07/21/2016, 8:48 AM

## 2016-07-22 ENCOUNTER — Encounter (HOSPITAL_COMMUNITY): Payer: Self-pay | Admitting: Certified Registered Nurse Anesthetist

## 2016-07-22 ENCOUNTER — Inpatient Hospital Stay (HOSPITAL_COMMUNITY): Payer: Medicare Other | Admitting: Certified Registered Nurse Anesthetist

## 2016-07-22 ENCOUNTER — Encounter (HOSPITAL_COMMUNITY)
Admission: EM | Disposition: A | Discharge status: Home / Self Care | Payer: Self-pay | Source: Home / Self Care | Attending: Internal Medicine

## 2016-07-22 DIAGNOSIS — I5042 Chronic combined systolic (congestive) and diastolic (congestive) heart failure: Secondary | ICD-10-CM

## 2016-07-22 HISTORY — PX: REVISON OF ARTERIOVENOUS FISTULA: SHX6074

## 2016-07-22 LAB — BASIC METABOLIC PANEL
ANION GAP: 10 (ref 5–15)
BUN: 27 mg/dL — ABNORMAL HIGH (ref 6–20)
CALCIUM: 9 mg/dL (ref 8.9–10.3)
CO2: 30 mmol/L (ref 22–32)
CREATININE: 6.65 mg/dL — AB (ref 0.61–1.24)
Chloride: 91 mmol/L — ABNORMAL LOW (ref 101–111)
GFR, EST AFRICAN AMERICAN: 9 mL/min — AB (ref >60)
GFR, EST NON AFRICAN AMERICAN: 7 mL/min — AB (ref >60)
Glucose, Bld: 94 mg/dL (ref 65–99)
Potassium: 5 mmol/L (ref 3.5–5.1)
Sodium: 131 mmol/L — ABNORMAL LOW (ref 135–145)

## 2016-07-22 LAB — CBC
HCT: 36.2 % — ABNORMAL LOW (ref 39.0–52.0)
HEMOGLOBIN: 11.8 g/dL — AB (ref 13.0–17.0)
MCH: 30 pg (ref 26.0–34.0)
MCHC: 32.6 g/dL (ref 30.0–36.0)
MCV: 92.1 fL (ref 78.0–100.0)
PLATELETS: 166 10*3/uL (ref 150–400)
RBC: 3.93 MIL/uL — AB (ref 4.22–5.81)
RDW: 15.3 % (ref 11.5–15.5)
WBC: 8.1 10*3/uL (ref 4.0–10.5)

## 2016-07-22 LAB — GLUCOSE, CAPILLARY
Glucose-Capillary: 96 mg/dL (ref 65–99)
Glucose-Capillary: 102 mg/dL — ABNORMAL HIGH (ref 65–99)
Glucose-Capillary: 105 mg/dL — ABNORMAL HIGH (ref 65–99)

## 2016-07-22 LAB — SURGICAL PCR SCREEN
MRSA, PCR: NEGATIVE
STAPHYLOCOCCUS AUREUS: NEGATIVE

## 2016-07-22 LAB — PROTIME-INR
INR: 1.23
PROTHROMBIN TIME: 15.6 s — AB (ref 11.4–15.2)

## 2016-07-22 SURGERY — REVISON OF ARTERIOVENOUS FISTULA
Anesthesia: Monitor Anesthesia Care | Site: Arm Upper | Laterality: Left

## 2016-07-22 MED ORDER — WARFARIN SODIUM 5 MG PO TABS
10.0000 mg | ORAL_TABLET | Freq: Once | ORAL | Status: AC
  Administered 2016-07-22: 10 mg via ORAL
  Filled 2016-07-22: qty 2

## 2016-07-22 MED ORDER — PROPOFOL 1000 MG/100ML IV EMUL
INTRAVENOUS | Status: AC
  Filled 2016-07-22: qty 100

## 2016-07-22 MED ORDER — LIDOCAINE HCL (CARDIAC) 20 MG/ML IV SOLN
INTRAVENOUS | Status: DC | PRN
  Administered 2016-07-22: 100 mg via INTRAVENOUS

## 2016-07-22 MED ORDER — LIDOCAINE HCL (PF) 0.5 % IJ SOLN
INTRAMUSCULAR | Status: AC
  Filled 2016-07-22: qty 50

## 2016-07-22 MED ORDER — FENTANYL CITRATE (PF) 100 MCG/2ML IJ SOLN
INTRAMUSCULAR | Status: DC | PRN
  Administered 2016-07-22: 50 ug via INTRAVENOUS
  Administered 2016-07-22 (×3): 25 ug via INTRAVENOUS

## 2016-07-22 MED ORDER — CEFAZOLIN SODIUM 1 G IJ SOLR
INTRAMUSCULAR | Status: DC | PRN
  Administered 2016-07-22: 1 g via INTRAMUSCULAR

## 2016-07-22 MED ORDER — FENTANYL CITRATE (PF) 100 MCG/2ML IJ SOLN: 25.0000 ug | INTRAMUSCULAR | Status: DC | PRN

## 2016-07-22 MED ORDER — SODIUM CHLORIDE 0.9 % IR SOLN
Status: DC | PRN
  Administered 2016-07-22: 1000 mL

## 2016-07-22 MED ORDER — FENTANYL CITRATE (PF) 250 MCG/5ML IJ SOLN
INTRAMUSCULAR | Status: AC
  Filled 2016-07-22: qty 5

## 2016-07-22 MED ORDER — PHENYLEPHRINE HCL 10 MG/ML IJ SOLN
INTRAMUSCULAR | Status: DC | PRN
  Administered 2016-07-22 (×3): 80 ug via INTRAVENOUS

## 2016-07-22 MED ORDER — MIDAZOLAM HCL 2 MG/2ML IJ SOLN
INTRAMUSCULAR | Status: AC
  Filled 2016-07-22: qty 2

## 2016-07-22 MED ORDER — WARFARIN - PHARMACIST DOSING INPATIENT: Freq: Every day | Status: DC

## 2016-07-22 MED ORDER — SODIUM CHLORIDE 0.9 % IV SOLN
INTRAVENOUS | Status: DC | PRN
  Administered 2016-07-22: 11:00:00 via INTRAVENOUS

## 2016-07-22 MED ORDER — CEFAZOLIN SODIUM 1 G IJ SOLR
INTRAMUSCULAR | Status: AC
  Filled 2016-07-22: qty 10

## 2016-07-22 MED ORDER — ONDANSETRON HCL 4 MG/2ML IJ SOLN: 4.0000 mg | Freq: Four times a day (QID) | INTRAMUSCULAR | Status: DC | PRN

## 2016-07-22 MED ORDER — HEPARIN SODIUM (PORCINE) 5000 UNIT/ML IJ SOLN
5000.0000 [IU] | Freq: Three times a day (TID) | INTRAMUSCULAR | Status: DC
  Administered 2016-07-22: 5000 [IU] via SUBCUTANEOUS
  Filled 2016-07-22 (×2): qty 1

## 2016-07-22 MED ORDER — MIDAZOLAM HCL 2 MG/2ML IJ SOLN
INTRAMUSCULAR | Status: DC | PRN
  Administered 2016-07-22: 2 mg via INTRAVENOUS

## 2016-07-22 MED ORDER — SODIUM CHLORIDE 0.9 % IV SOLN
INTRAVENOUS | Status: DC | PRN
  Administered 2016-07-22: 500 mL

## 2016-07-22 MED ORDER — GLYCOPYRROLATE 0.2 MG/ML IJ SOLN
INTRAMUSCULAR | Status: DC | PRN
  Administered 2016-07-22: 0.2 mg via INTRAVENOUS

## 2016-07-22 MED ORDER — PROTAMINE SULFATE 10 MG/ML IV SOLN
INTRAVENOUS | Status: AC
  Filled 2016-07-22: qty 5

## 2016-07-22 MED ORDER — PROPOFOL 500 MG/50ML IV EMUL
INTRAVENOUS | Status: DC | PRN
  Administered 2016-07-22: 75 ug/kg/min via INTRAVENOUS

## 2016-07-22 MED ORDER — PROPOFOL 10 MG/ML IV BOLUS
INTRAVENOUS | Status: DC | PRN
  Administered 2016-07-22: 20 mg via INTRAVENOUS

## 2016-07-22 MED ORDER — LIDOCAINE HCL (PF) 0.5 % IJ SOLN
INTRAMUSCULAR | Status: DC | PRN
  Administered 2016-07-22: 15 mL via INTRADERMAL

## 2016-07-22 MED ORDER — OXYCODONE HCL 5 MG/5ML PO SOLN: 5.0000 mg | Freq: Once | ORAL | Status: DC | PRN

## 2016-07-22 MED ORDER — OXYCODONE HCL 5 MG PO TABS: 5.0000 mg | ORAL_TABLET | Freq: Once | ORAL | Status: DC | PRN

## 2016-07-22 MED ORDER — SODIUM CHLORIDE 0.9 % IV SOLN
INTRAVENOUS | Status: DC
  Administered 2016-07-22: 10:00:00 via INTRAVENOUS

## 2016-07-22 MED ORDER — PROPOFOL 10 MG/ML IV BOLUS
INTRAVENOUS | Status: AC
  Filled 2016-07-22: qty 20

## 2016-07-22 SURGICAL SUPPLY — 28 items
ARMBAND PINK RESTRICT EXTREMIT (MISCELLANEOUS) ×2 IMPLANT
CANISTER SUCTION 2500CC (MISCELLANEOUS) ×2 IMPLANT
CANNULA VESSEL 3MM 2 BLNT TIP (CANNULA) ×2 IMPLANT
CLIP LIGATING EXTRA MED SLVR (CLIP) ×2 IMPLANT
CLIP LIGATING EXTRA SM BLUE (MISCELLANEOUS) ×2 IMPLANT
COVER PROBE W GEL 5X96 (DRAPES) ×2 IMPLANT
DECANTER SPIKE VIAL GLASS SM (MISCELLANEOUS) ×2 IMPLANT
DERMABOND ADVANCED (GAUZE/BANDAGES/DRESSINGS) ×1
DERMABOND ADVANCED .7 DNX12 (GAUZE/BANDAGES/DRESSINGS) ×1 IMPLANT
ELECT REM PT RETURN 9FT ADLT (ELECTROSURGICAL) ×2
ELECTRODE REM PT RTRN 9FT ADLT (ELECTROSURGICAL) ×1 IMPLANT
GLOVE BIO SURGEON STRL SZ 6.5 (GLOVE) ×4 IMPLANT
GLOVE BIOGEL PI IND STRL 7.0 (GLOVE) ×1 IMPLANT
GLOVE BIOGEL PI INDICATOR 7.0 (GLOVE) ×1
GLOVE SS BIOGEL STRL SZ 7.5 (GLOVE) ×1 IMPLANT
GLOVE SUPERSENSE BIOGEL SZ 7.5 (GLOVE) ×1
GOWN STRL REUS W/ TWL LRG LVL3 (GOWN DISPOSABLE) ×3 IMPLANT
GOWN STRL REUS W/TWL LRG LVL3 (GOWN DISPOSABLE) ×3
KIT BASIN OR (CUSTOM PROCEDURE TRAY) ×2 IMPLANT
KIT ROOM TURNOVER OR (KITS) ×2 IMPLANT
NS IRRIG 1000ML POUR BTL (IV SOLUTION) ×2 IMPLANT
PACK CV ACCESS (CUSTOM PROCEDURE TRAY) ×2 IMPLANT
PAD ARMBOARD 7.5X6 YLW CONV (MISCELLANEOUS) ×4 IMPLANT
SUT PROLENE 6 0 CC (SUTURE) ×2 IMPLANT
SUT VIC AB 3-0 SH 27 (SUTURE) ×2
SUT VIC AB 3-0 SH 27X BRD (SUTURE) ×2 IMPLANT
UNDERPAD 30X30 (UNDERPADS AND DIAPERS) ×2 IMPLANT
WATER STERILE IRR 1000ML POUR (IV SOLUTION) ×2 IMPLANT

## 2016-07-22 NOTE — Anesthesia Preprocedure Evaluation (Addendum)
Anesthesia Evaluation  Patient identified by MRN, date of birth, ID band Patient awake    Reviewed: Allergy & Precautions, H&P , NPO status , Patient's Chart, lab work & pertinent test results  Airway Mallampati: II   Neck ROM: full    Dental  (+) Teeth Intact, Dental Advisory Given   Pulmonary sleep apnea ,    breath sounds clear to auscultation       Cardiovascular hypertension, + dysrhythmias Atrial Fibrillation  Rhythm:regular Rate:Normal     Neuro/Psych    GI/Hepatic   Endo/Other  diabetes, Type 2  Renal/GU ESRF and DialysisRenal disease     Musculoskeletal  (+) Arthritis ,   Abdominal   Peds  Hematology   Anesthesia Other Findings   Reproductive/Obstetrics                            Anesthesia Physical Anesthesia Plan  ASA: III  Anesthesia Plan: MAC   Post-op Pain Management:    Induction: Intravenous  Airway Management Planned: Simple Face Mask  Additional Equipment:   Intra-op Plan:   Post-operative Plan:   Informed Consent: I have reviewed the patients History and Physical, chart, labs and discussed the procedure including the risks, benefits and alternatives for the proposed anesthesia with the patient or authorized representative who has indicated his/her understanding and acceptance.     Plan Discussed with: CRNA, Anesthesiologist and Surgeon  Anesthesia Plan Comments:         Anesthesia Quick Evaluation

## 2016-07-22 NOTE — Transfer of Care (Signed)
Immediate Anesthesia Transfer of Care Note  Patient: Bruce Cole  Procedure(s) Performed: Procedure(s): REVISON OF BASILIC VEIN TRANSPOSITION ANASTOMOSIS (Left)  Patient Location: PACU  Anesthesia Type:MAC  Level of Consciousness: awake and alert   Airway & Oxygen Therapy: Patient Spontanous Breathing and Patient connected to face mask oxygen  Post-op Assessment: Report given to RN and Post -op Vital signs reviewed and stable  Post vital signs: Reviewed and stable  Last Vitals:  Vitals:   07/22/16 0547 07/22/16 0941  BP: (!) 91/56 (!) 98/56  Pulse: 83 63  Resp: 16 16  Temp: 37.2 C 37.2 C    Last Pain:  Vitals:   07/22/16 0941  TempSrc: Oral  PainSc:       Patients Stated Pain Goal: 2 (47/12/52 7129)  Complications: No apparent anesthesia complications

## 2016-07-22 NOTE — Interval H&P Note (Signed)
History and Physical Interval Note:  07/22/2016 10:49 AM  Bruce Cole  has presented today for surgery, with the diagnosis of End Stage Renal Disease N18.6; Difficulty cannulating left arm basilic vein transposition T82.590A  The various methods of treatment have been discussed with the patient and family. After consideration of risks, benefits and other options for treatment, the patient has consented to  Procedure(s): REVISON OF BASILIC VEIN TRANSPOSITION ANASTOMOSIS (Left) as a surgical intervention .  The patient's history has been reviewed, patient examined, no change in status, stable for surgery.  I have reviewed the patient's chart and labs.  Questions were answered to the patient's satisfaction.     Curt Jews

## 2016-07-22 NOTE — Anesthesia Postprocedure Evaluation (Addendum)
Anesthesia Post Note  Patient: Bruce Cole  Procedure(s) Performed: Procedure(s) (LRB): REVISON OF BASILIC VEIN TRANSPOSITION ANASTOMOSIS (Left)  Patient location during evaluation: PACU Anesthesia Type: MAC Level of consciousness: awake and alert Pain management: pain level controlled Vital Signs Assessment: post-procedure vital signs reviewed and stable Respiratory status: spontaneous breathing, nonlabored ventilation, respiratory function stable and patient connected to nasal cannula oxygen Cardiovascular status: stable and blood pressure returned to baseline Anesthetic complications: no       Last Vitals:  Vitals:   07/22/16 1340 07/22/16 1357  BP: 105/65 95/60  Pulse: (!) 51 (!) 55  Resp: 13 16  Temp: 36.6 C 36.6 C    Last Pain:  Vitals:   07/22/16 1357  TempSrc: Oral  PainSc:                  [place]

## 2016-07-22 NOTE — Care Management Note (Signed)
Case Management Note  Patient Details  Name: Bruce Cole MRN: [id number] Date of Birth: 22-May-1941  Subjective/Objective:                    Action/Plan:   Expected Discharge Date:                  Expected Discharge Plan:  Home/Self Care  In-House Referral:     Discharge planning Services     Post Acute Care Choice:    Choice offered to:     DME Arranged:    DME Agency:     HH Arranged:    [place] Agency:     Status of Service:  In process, will continue to follow  If discussed at Long Length of Stay Meetings, dates discussed:    Additional Comments:  Marilu Favre, RN 07/22/2016, 9:47 AM

## 2016-07-22 NOTE — Progress Notes (Signed)
Subjective:  No cos, HD yest tolerated/"i was eating lots of wrong Potassium foos"  Objective Vital signs in last 24 hours: Vitals:   07/21/16 1740 07/21/16 1825 07/21/16 2111 07/22/16 0547  BP: 109/66 109/66 (!) 95/55 (!) 91/56  Pulse: (!) 51 (!) 52 67 83  Resp: 20 20 14 16   Temp: 98.1 F (36.7 C) 98.2 F (36.8 C) 98.8 F (37.1 C) 99 F (37.2 C)  TempSrc: Oral Oral Oral Oral  SpO2: 98% 98% 100% 98%  Weight: 108 kg (238 lb 1.6 oz)     Height:       Weight change: 3.607 kg (7 lb 15.2 oz)  Physical Exam: General: Alert NAD Ox3  Heart: RRR, 1/6 systolic M Lungs: CTA [place] Abdomen: Active BS, non-tender Extremities: No LE edema Dialysis Access: LUA AVF +thrill/bruit   OP Dialysis Orders: AFKCon TTS 107.5 kg 400/800 2K/ 2.25 Ca UF Profile 2, Linear Na Heparin 6000 units IV q tx Last Mircera 12/26 50 mcg IV, has been DC'd  Hectoral 2 mcg IV q tx  Problem/Plan: 1. Hyperkalemia: K+ >7.5 on admission= sec . To ^ K foods and AVF  ( HO revision AVF 05/30/16 by Dr. Bridgett Larsson.)  VVS consulted SP  Fistulogram=""demonstrates recurrent severe swing segment stenosis "" Dr. Donnetta Hutching this am revision of left basilic vein transposition anastomosis   Pt re instructed re dietary K restriction . K+ 5.0 this am    2.  ESRD = HD on Schedule TTS ( op = [facility]. Cont on schedule HD next tomor  OP at center if stable  Post op  4. Anemia - HGB 11.8 No OP ESA.  5. Secondary hyperparathyroidism - Cont binder, hectorol 6. HTN/volume -  BP's soft on HD (he says is usual).  (EDW 107.5)  No excess vol on exam  yest  Post HD wt 108kg - will use as new EDW  7. Nutrition - Albumin 3.3. Renal/Carb mod diet Nepro/Renal vits.  8. H/O chronic Afib: Slow VR resolved with K improved . Pharmacy managing coumadin..  9. Diarrhea: H/O watery diarrhea prior to adm. Resolved. C. Dif neg  10. DM: per primary  Ernest Haber, PA-C [facility] Bee[facility] [phone] 07/22/2016,8:10 AM  LOS: 6 days   Labs: Basic  Metabolic Panel:  Recent Labs Lab 07/16/16 1157 07/17/16 0536  07/20/16 0430 07/21/16 1500 07/22/16 0658  NA 137 136  < > 134* 131* 131*  K 5.3* 6.9*  < > 4.4 4.5 5.0  CL 97* 97*  < > 96* 94* 91*  CO2 28 24  < > 27 23 30   GLUCOSE 134* 95  < > 93 93 94  BUN 25* 38*  < > 27* 47* 27*  CREATININE 5.83* 7.99*  < > 6.74* 9.49* 6.65*  CALCIUM 9.1 9.3  < > 9.0 8.8* 9.0  PHOS 3.8 6.0*  --   --  6.2*  --   < > = values in this interval not displayed. Liver Function Tests:  Recent Labs Lab 07/16/16 0255 07/16/16 0802 07/16/16 1157 07/21/16 1500  AST 28  --   --   --   ALT 17  --   --   --   ALKPHOS 137*  --   --   --   BILITOT 0.6  --   --   --   PROT 7.9  --   --   --   ALBUMIN 3.7 3.9 3.3* 3.3*   No results for input(s): LIPASE, AMYLASE in  the last 168 hours. No results for input(s): AMMONIA in the last 168 hours. CBC:  Recent Labs Lab 07/16/16 0255  07/17/16 0536 07/18/16 0412 07/19/16 0343 07/20/16 0430 07/22/16 0658  WBC 9.8  < > 7.9 7.0 7.4 7.7 8.1  NEUTROABS 8.1*  --   --   --   --   --   --   HGB 12.0*  < > 10.6* 10.5* 10.8* 11.4* 11.8*  HCT 37.7*  < > 34.1* 33.6* 33.1* 34.8* 36.2*  MCV 96.7  < > 95.3 94.4 92.5 93.0 92.1  PLT 165  < > 148* 140* 147* 159 166  < > = values in this interval not displayed. Cardiac Enzymes:  Recent Labs Lab 07/16/16 0255  TROPONINI 0.07*   CBG:  Recent Labs Lab 07/20/16 2056 07/21/16 0724 07/21/16 1134 07/21/16 2006 07/22/16 0730  GLUCAP 92 94 123* 113* 96    Studies/Results: No results found. Medications:  . allopurinol  100 mg Oral Daily  . doxercalciferol  2 mcg Intravenous Q T,Th,Sa-HD  . gabapentin  300 mg Oral QHS  . heparin subcutaneous  5,000 Units Subcutaneous Q8H  . hydroxychloroquine  200 mg Oral Daily  . insulin aspart  0-5 Units Subcutaneous QHS  . insulin aspart  0-9 Units Subcutaneous TID WC  . multivitamin  1 tablet Oral QHS  . pantoprazole  40 mg Oral Q1200  . sevelamer carbonate  2,400 mg  Oral TID WC

## 2016-07-22 NOTE — Progress Notes (Signed)
[facility] Progress Note  Patient: Bruce Cole JSE:[id number]   PCP: Nance Pear., NP DOB: Oct 02, 1940   DOA: 07/16/2016   DOS: 07/22/2016   Date of Service: the patient was seen and examined on 07/22/2016  Brief hospital course: Pt. with PMH of A. fib on warfarin, CHF, ESRD, GERD, HTN, type II DM; admitted on 07/16/2016, with complaint of generalized weakness, diarrhea, shortness of breath, was found to have hyperkalemia as well as volume overload. Underwent urgent hemodialysis. Vascular surgery consulted as there was a concern that the AV fistula was not working,Underwent fistulogram which shows decreased flow from the basilic vein Currently further plan is Revision of AV fistula tomorrow  Assessment and Plan: ESRD with acute hyperkalemia with bradycardia and ECG changes - Nephrology following, he received dialysis and his potassium has improved and is now within normal limits. There are concerns for AVF malfunction and Patient tolerated revision of the AV fistula on 07/22/2016. We will monitor for patient getting his hemodialysis with a new fistula.  Chronic combined systolic and diastolic heart failure  - Fluid management per nephrology, he appears euvolemic on exam  Chronic atrial fibrillation - CHADSVASC score at least 2, continue Coumadin, INR less than 2. Coumadin restart postprocedure today  Diarrhea - Resolved, C diff was negative  Chronic anemia  - stable, in the setting of end-stage renal disease  Diabetes  - SSI, CBGs 80-90s - Hemoglobin A1c 5.5  Bradycardia. Junctional rhythm on EKG, patient remained asymptomatic. Continue to monitor at present.   Bowel regimen: last BM 07/19/2016 Diet: renal diet DVT Prophylaxis: subcutaneous Heparin, warfarin  Advance goals of care discussion: full code  Family Communication: no family was present at bedside, at the time of interview.  Disposition:  Discharge to home Expected discharge date:   07/23/2016.  Consultants: Nephrology, vascular surgery Procedures: HD, fistulogram  Antibiotics: Anti-infectives    Start     Dose/Rate Route Frequency Ordered Stop   07/16/16 1200  hydroxychloroquine (PLAQUENIL) tablet 200 mg     200 mg Oral Daily 07/16/16 0433        Subjective: Feeling better, has come concerns regarding functioning of the AV fistula outpatient  Objective: Physical Exam: Vitals:   07/22/16 1310 07/22/16 1325 07/22/16 1340 07/22/16 1357  BP: (!) 89/45 102/66 105/65 95/60  Pulse: (!) 59 60 (!) 51 (!) 55  Resp: (!) 21 18 13 16   Temp: 98.2 F (36.8 C)  97.9 F (36.6 C) 97.9 F (36.6 C)  TempSrc:    Oral  SpO2: 100% 100% 98% 98%  Weight:      Height:        Intake/Output Summary (Last 24 hours) at 07/22/16 1703 Last data filed at 07/22/16 1634  Gross per 24 hour  Intake              980 ml  Output             3250 ml  Net            -2270 ml   Filed Weights   07/21/16 0500 07/21/16 1338 07/21/16 1740  Weight: 105.5 kg (232 lb 9.6 oz) 111.2 kg (245 lb 2.4 oz) 108 kg (238 lb 1.6 oz)    General: Alert, Awake and Oriented to Time, Place and Person. Appear in mild distress, affect appropriate Eyes: PERRL, Conjunctiva normal ENT: Oral Mucosa clear moist. Neck: no JVD, no Abnormal Mass Or lumps Cardiovascular: S1 and S2 Present, aortic systolic Murmur, Respiratory: Bilateral Air entry equal  and Decreased, no use of accessory muscle, Clear to Auscultation, no Crackles, no wheezes Abdomen: Bowel Sound present, Soft and no tenderness Skin: no redness, no Rash, no induration Extremities: trave Pedal edema, no calf tenderness Neurologic: Grossly no focal neuro deficit. Bilaterally Equal motor strength  Data Reviewed: CBC:  Recent Labs Lab 07/16/16 0255  07/17/16 0536 07/18/16 0412 07/19/16 0343 07/20/16 0430 07/22/16 0658  WBC 9.8  < > 7.9 7.0 7.4 7.7 8.1  NEUTROABS 8.1*  --   --   --   --   --   --   HGB 12.0*  < > 10.6* 10.5* 10.8* 11.4* 11.8*    HCT 37.7*  < > 34.1* 33.6* 33.1* 34.8* 36.2*  MCV 96.7  < > 95.3 94.4 92.5 93.0 92.1  PLT 165  < > 148* 140* 147* 159 166  < > = values in this interval not displayed. Basic Metabolic Panel:  Recent Labs Lab 07/16/16 0255 07/16/16 0802 07/16/16 1157 07/17/16 0536  07/18/16 0412 07/19/16 0343 07/20/16 0430 07/21/16 1500 07/22/16 0658  NA 136 137 137 136  < > 133* 132* 134* 131* 131*  K >7.5* 6.0* 5.3* 6.9*  < > 5.3* 3.8 4.4 4.5 5.0  CL 108 101 97* 97*  < > 94* 91* 96* 94* 91*  CO2 16* 24 28 24   < > 26 27 27 23 30   GLUCOSE 139* 115* 134* 95  < > 92 114* 93 93 94  BUN 67* 35* 25* 38*  < > 32* 46* 27* 47* 27*  CREATININE 9.90* 5.90* 5.83* 7.99*  < > 6.93* 8.82* 6.74* 9.49* 6.65*  CALCIUM 9.2 9.3 9.1 9.3  < > 9.1 8.6* 9.0 8.8* 9.0  MG 2.7*  --   --  2.4  --   --   --   --   --   --   PHOS  --  2.8 3.8 6.0*  --   --   --   --  6.2*  --   < > = values in this interval not displayed.  Liver Function Tests:  Recent Labs Lab 07/16/16 0255 07/16/16 0802 07/16/16 1157 07/21/16 1500  AST 28  --   --   --   ALT 17  --   --   --   ALKPHOS 137*  --   --   --   BILITOT 0.6  --   --   --   PROT 7.9  --   --   --   ALBUMIN 3.7 3.9 3.3* 3.3*   No results for input(s): LIPASE, AMYLASE in the last 168 hours. No results for input(s): AMMONIA in the last 168 hours. Coagulation Profile:  Recent Labs Lab 07/18/16 0412 07/19/16 0343 07/20/16 0847 07/21/16 0557 07/22/16 0658  INR 2.11 2.07 1.61 1.40 1.23   Cardiac Enzymes:  Recent Labs Lab 07/16/16 0255  TROPONINI 0.07*   BNP (last 3 results) No results for input(s): PROBNP in the last 8760 hours.  CBG:  Recent Labs Lab 07/20/16 2056 07/21/16 0724 07/21/16 1134 07/21/16 2006 07/22/16 0730  GLUCAP 92 94 123* 113* 96    Studies: No results found.   Scheduled Meds: . allopurinol  100 mg Oral Daily  . doxercalciferol  2 mcg Intravenous Q T,Th,Sa-HD  . gabapentin  300 mg Oral QHS  . heparin subcutaneous  5,000  Units Subcutaneous Q8H  . hydroxychloroquine  200 mg Oral Daily  . insulin aspart  0-5 Units Subcutaneous QHS  . insulin  aspart  0-9 Units Subcutaneous TID WC  . multivitamin  1 tablet Oral QHS  . pantoprazole  40 mg Oral Q1200  . sevelamer carbonate  2,400 mg Oral TID WC  . warfarin  10 mg Oral ONCE-1800  . Warfarin - Pharmacist Dosing Inpatient   Does not apply q1800   Continuous Infusions: PRN Meds: sodium chloride, HYDROcodone-acetaminophen  Time spent: 30 minutes  Author: Berle Mull, MD [facility] Pager: [phone] 07/22/2016 5:03 PM  If 7PM-7AM, please contact night-coverage at [email or web address], password [facility] [place]

## 2016-07-22 NOTE — H&P (View-Only) (Signed)
   Daily Progress Note  [name] fistulogram demonstrates recurrent severe swing segment stenosis.  I have found that perianastomotic stenoses have limited response to serial angioplasty.  I have recommended: revision of left basilic vein transposition anastomosis.  This will be scheduled for tomorrow with Dr. Donnetta Hutching.  Adele Barthel, MD, FACS [facility] Office: [phone] Pager: [phone]  07/21/2016, 8:48 AM

## 2016-07-22 NOTE — Discharge Instructions (Signed)
° ° °  07/22/2016 KOBY PICKUP [id number] [birth year]  Surgeon(s): Rosetta Posner, MD  Procedure(s): REVISON OF BASILIC VEIN TRANSPOSITION ANASTOMOSIS  x May stick graft on designated area only:  Do NOT stick over incision x 8 weeks.

## 2016-07-22 NOTE — Op Note (Signed)
    OPERATIVE REPORT  DATE OF SURGERY: 07/22/2016  PATIENT: Bruce Cole, 76 y.o. male MRN: [id number]  DOB: Dec 10, 1940  PRE-OPERATIVE DIAGNOSIS: End-stage renal disease with stenosis near the arteriovenous anastomosis of left upper arm basilic vein fistula  POST-OPERATIVE DIAGNOSIS:  Same  PROCEDURE: Revision of left upper arm fistula with vein patch over basilic vein fistula near the brachial artery anastomosis  SURGEON:  Curt Jews, M.D.  PHYSICIAN ASSISTANT: Samantha Rhyne PA-C  ANESTHESIA:  Local with sedation  EBL: Minimal ml  Total I/O In: 400 [I.V.:400] Out: 50 [Blood:50]  BLOOD ADMINISTERED: None  DRAINS: None  SPECIMEN: None  COUNTS CORRECT:  YES  PLAN OF CARE: PACU   PATIENT DISPOSITION:  PACU - hemodynamically stable  PROCEDURE DETAILS: Patient was taken to the operating room placed supine position where the area of the left arm prepped draped in usual sterile fashion. Using local anesthesia incision was made over the basilic vein fistula near the arteriovenous anastomosis. The vein was of very good caliber at the level of the brachial artery. Just distal to this there was a segment approximately 1/2 cm which was very sclerotic. The vein more proximal to this was a very good caliber as was seen by the recent fistulogram. Patient had had a prior radiocephalic fistula and the cephalic vein at the radiocephalic fistula was patent several centimeters but was occluded above this. There was felt this would be good for vein patch over the sclerotic area. Separate incision was made using local anesthesia over the cephalic vein at the wrist. The vein was ligated near the radial artery anastomosis and was ligated distally. The vein was harvested and was opened longitudinally. The vein was of excellent size. The fistula was occluded at the brachial artery anastomosis in the distal to the sclerotic area. The vein was opened with 11 blade incision longitudinally with Potts  scissors through the sclerotic area onto the normal basilic vein above this. The harvested vein was brought onto the field and was sewn as a patch angioplasty with a running 6-0 Prolene suture. Prior to completion of the closure 84 dilator passed easily through the patch proximal and distally. Anastomosis completed and clamps removed with excellent flow noted in the upper arm fistula. Wounds irrigated with saline and hemostasis tablet cautery and the wounds were closed with 3-0 Vicryl in the subcutaneous and subcuticular tissue. Sterile dressing was applied and the patient was transferred to the recovery room in stable condition   Rosetta Posner, M.D., [facility] 07/22/2016 1:23 PM

## 2016-07-22 NOTE — Progress Notes (Addendum)
ANTICOAGULATION CONSULT NOTE - Initial Consult  Pharmacy Consult:  Coumadin Indication: atrial fibrillation  Allergies  Allergen Reactions  . Ace Inhibitors Other (See Comments)    Worsening renal insufficiency    Patient Measurements: Height: 6\' 3"  (190.5 cm) Weight: 238 lb 1.6 oz (108 kg) IBW/kg (Calculated) : 84.5  Vital Signs: Temp: 97.9 F (36.6 C) (01/05 1357) Temp Source: Oral (01/05 1357) BP: 95/60 (01/05 1357) Pulse Rate: 55 (01/05 1357)  Labs:  Recent Labs  07/20/16 0430 07/20/16 0847 07/21/16 0557 07/21/16 1500 07/22/16 0658  HGB 11.4*  --   --   --  11.8*  HCT 34.8*  --   --   --  36.2*  PLT 159  --   --   --  166  LABPROT  --  19.3* 17.2*  --  15.6*  INR  --  1.61 1.40  --  1.23  CREATININE 6.74*  --   --  9.49* 6.65*    Estimated Creatinine Clearance: 13.1 mL/min (by C-G formula based on SCr of 6.65 mg/dL (H)).   Medical History: Past Medical History:  Diagnosis Date  . Acute blood loss anemia   . Atrial fibrillation (HCC)    Chronic  . Chronic combined systolic and diastolic heart failure ([place])   . Colon polyp 2000  . Dysrhythmia    hx  . ESRD on hemodialysis ([place])    [place]  . Essential hypertension   . Gastritis and gastroduodenitis   . GI bleed   . Gout   . Heart murmur   . History of blood transfusion   . History of kidney stones   . Nephrolithiasis   . OSA (obstructive sleep apnea) 09/02/2013    IMPRESSION :  1. Mild obstructive sleep apnea with hypopneas causing sleep fragmentation and moderate oxygen desaturation.  2. Short runs of nonsustained VT were noted. His beta blocker may need to be titrated 3. Significant PLM's were noted, the PLM arousal index was low. Please correlate with clinical history of restless leg syndrome.  4. Sleep efficiency was poor.  RECOMMENDATION:  1. Treatment options for this degree of sleep disordered breathing include weight loss and positional therapy to avoid supine sleep 2.  Consider titrating beta blocker further, defer to cardiologist 3. Patient should be cautioned against driving when sleepy.They should be asked to avoid medications with sedative side effects     . Osteoarthritis of right hip   . Pneumonia    hx 30 yrs ago  . Primary osteoarthritis of right hip   . Rheumatoid arthritis ([place])   . Thrombocytopenia (HCC)        Assessment: Bruce Cole with history of Afib on Coumadin PTA.  Coumadin has been on hold for fistulogram and related procedures.  Spoke to Dr. Donnetta Hutching and Posey Pronto, resume Coumadin tonight.  No full-dose bridging necessary per MD.  Patient's INR is sub-therapeutic as expected.  No overt bleeding reported.  Home Coumadin dose:  7.5mg  daily   Goal of Therapy:  INR 2-3    Plan:  Coumadin 10mg  PO today Heparin SQ until INR therapeutic. Daily PT / INR    Aleiyah Halpin D. Mina Marble, PharmD, BCPS Pager:  [phone] 07/22/2016, 2:57 PM

## 2016-07-23 ENCOUNTER — Encounter (HOSPITAL_COMMUNITY): Payer: Self-pay | Admitting: Vascular Surgery

## 2016-07-23 LAB — RENAL FUNCTION PANEL
ALBUMIN: 3.3 g/dL — AB (ref 3.5–5.0)
Anion gap: 15 (ref 5–15)
BUN: 44 mg/dL — ABNORMAL HIGH (ref 6–20)
CALCIUM: 9.2 mg/dL (ref 8.9–10.3)
CO2: 24 mmol/L (ref 22–32)
CREATININE: 8.76 mg/dL — AB (ref 0.61–1.24)
Chloride: 89 mmol/L — ABNORMAL LOW (ref 101–111)
GFR calc non Af Amer: 5 mL/min — ABNORMAL LOW (ref >60)
GFR, EST AFRICAN AMERICAN: 6 mL/min — AB (ref >60)
Glucose, Bld: 100 mg/dL — ABNORMAL HIGH (ref 65–99)
Phosphorus: 6.6 mg/dL — ABNORMAL HIGH (ref 2.5–4.6)
Potassium: 5 mmol/L (ref 3.5–5.1)
SODIUM: 128 mmol/L — AB (ref 135–145)

## 2016-07-23 LAB — CBC
HCT: 34 % — ABNORMAL LOW (ref 39.0–52.0)
Hemoglobin: 11.3 g/dL — ABNORMAL LOW (ref 13.0–17.0)
MCH: 30.2 pg (ref 26.0–34.0)
MCHC: 33.2 g/dL (ref 30.0–36.0)
MCV: 90.9 fL (ref 78.0–100.0)
PLATELETS: 173 10*3/uL (ref 150–400)
RBC: 3.74 MIL/uL — AB (ref 4.22–5.81)
RDW: 15.2 % (ref 11.5–15.5)
WBC: 7.4 10*3/uL (ref 4.0–10.5)

## 2016-07-23 LAB — PROTIME-INR
INR: 1.17
PROTHROMBIN TIME: 14.9 s (ref 11.4–15.2)

## 2016-07-23 MED ORDER — DOXERCALCIFEROL 4 MCG/2ML IV SOLN
INTRAVENOUS | Status: AC
Start: 2016-07-23 — End: 2016-07-23
  Administered 2016-07-23: 2 ug via INTRAVENOUS
  Filled 2016-07-23: qty 2

## 2016-07-23 MED ORDER — WARFARIN SODIUM 5 MG PO TABS: 10.0000 mg | ORAL_TABLET | Freq: Once | ORAL | Status: DC

## 2016-07-23 MED ORDER — HYDROCODONE-ACETAMINOPHEN 5-325 MG PO TABS: 1.0000 | ORAL_TABLET | Freq: Four times a day (QID) | ORAL | 0 refills | Status: DC | PRN

## 2016-07-23 MED ORDER — RENVELA 800 MG PO TABS: 2400.0000 mg | ORAL_TABLET | Freq: Three times a day (TID) | ORAL | 0 refills | Status: AC

## 2016-07-23 NOTE — Discharge Planning (Signed)
AVS and RX to patient who verbalizes understanding. dc'd to private car with all personal belongings accompanied by friend at 46.

## 2016-07-23 NOTE — Procedures (Signed)
I was present at this dialysis session. I have reviewed the session itself and made appropriate changes.   No cannulation issues this AM. 2K bath, 2L UF to achieve EDW.  Qb 350 AP ok. OK For DC post HD.  Next HD 07/27/15  Filed Weights   07/21/16 1740 07/22/16 2143 07/23/16 0500  Weight: 108 kg (238 lb 1.6 oz) 95.7 kg (211 lb) 110.9 kg (244 lb 8 oz)     Recent Labs Lab 07/21/16 1500 07/22/16 0658  NA 131* 131*  K 4.5 5.0  CL 94* 91*  CO2 23 30  GLUCOSE 93 94  BUN 47* 27*  CREATININE 9.49* 6.65*  CALCIUM 8.8* 9.0  PHOS 6.2*  --      Recent Labs Lab 07/19/16 0343 07/20/16 0430 07/22/16 0658  WBC 7.4 7.7 8.1  HGB 10.8* 11.4* 11.8*  HCT 33.1* 34.8* 36.2*  MCV 92.5 93.0 92.1  PLT 147* 159 166    Scheduled Meds: . allopurinol  100 mg Oral Daily  . doxercalciferol  2 mcg Intravenous Q T,Th,Sa-HD  . gabapentin  300 mg Oral QHS  . heparin subcutaneous  5,000 Units Subcutaneous Q8H  . hydroxychloroquine  200 mg Oral Daily  . insulin aspart  0-5 Units Subcutaneous QHS  . insulin aspart  0-9 Units Subcutaneous TID WC  . multivitamin  1 tablet Oral QHS  . pantoprazole  40 mg Oral Q1200  . sevelamer carbonate  2,400 mg Oral TID WC  . Warfarin - Pharmacist Dosing Inpatient   Does not apply q1800   Continuous Infusions: PRN Meds:.sodium chloride, HYDROcodone-acetaminophen   Pearson Grippe  MD 07/23/2016, 7:53 AM

## 2016-07-23 NOTE — Progress Notes (Signed)
ANTICOAGULATION CONSULT NOTE  Pharmacy Consult:  Coumadin Indication: atrial fibrillation  Allergies  Allergen Reactions  . Ace Inhibitors Other (See Comments)    Worsening renal insufficiency    Patient Measurements: Height: 6\' 3"  (190.5 cm) Weight: 238 lb 15.7 oz (108.4 kg) IBW/kg (Calculated) : 84.5  Vital Signs: Temp: 97 F (36.1 C) (01/06 1130) Temp Source: Oral (01/06 1130) BP: 94/63 (01/06 1217) Pulse Rate: 52 (01/06 1217)  Labs:  Recent Labs  07/21/16 0557 07/21/16 1500 07/22/16 0658 07/23/16 0434 07/23/16 0738 07/23/16 0739  HGB  --   --  11.8*  --  11.3*  --   HCT  --   --  36.2*  --  34.0*  --   PLT  --   --  166  --  173  --   LABPROT 17.2*  --  15.6* 14.9  --   --   INR 1.40  --  1.23 1.17  --   --   CREATININE  --  9.49* 6.65*  --   --  8.76*    Estimated Creatinine Clearance: 10 mL/min (by C-G formula based on SCr of 8.76 mg/dL (H)).   Assessment: 69 YOM with history of Afib on Coumadin PTA.  Coumadin was held for fistulogram and related procedures, now resumed.   Patient's INR is sub-therapeutic as expected.  No overt bleeding reported, CBC stable.  Home Coumadin dose:  7.5mg  daily  Goal of Therapy:  INR 2-3  Plan:  Coumadin 10 mg PO tonight Heparin SQ until INR therapeutic Daily PT / INR Monitor for s/sx of bleeding   Renold Genta, PharmD, BCPS Clinical Pharmacist Phone for today - [place] - [phone] 07/23/2016 1:29 PM

## 2016-07-25 ENCOUNTER — Other Ambulatory Visit: Payer: Self-pay

## 2016-07-25 NOTE — Telephone Encounter (Signed)
TCM phone call made to patient today. Updated chart and reviewed medications.   Transition Care Management Follow-up Telephone Call  ADMISSION DATE: 07/16/16  DISCHARGE DATE: 07/23/16   How have you been since you were released from the hospital? Patient states he has been fine since dischage      Do you understand why you were in the hospital? YES   Do you understand the discharge instrcutions? Yes  Items Reviewed:   Medications reviewed: Changes made during hospitalization  Allergies reviewed: Reviewed  Dietary changes reviewed:Renal Diet  Referrals reviewed:Appointment scheduled for hospital follow up.   Functional Questionnaire:   Activities of Daily Living (ADLs):  No help needed at this time   Any transportation issues/concerns?: No   Any patient concerns? No concerns per patient   Confirmed with patient if condition begins to worsen call PCP or go to the ER. Yes   Patient was given the [place] line [phone]: Yes

## 2016-07-26 DIAGNOSIS — N186 End stage renal disease: Secondary | ICD-10-CM | POA: Diagnosis not present

## 2016-07-26 DIAGNOSIS — D631 Anemia in chronic kidney disease: Secondary | ICD-10-CM | POA: Diagnosis not present

## 2016-07-26 DIAGNOSIS — N2581 Secondary hyperparathyroidism of renal origin: Secondary | ICD-10-CM | POA: Diagnosis not present

## 2016-07-26 DIAGNOSIS — I4891 Unspecified atrial fibrillation: Secondary | ICD-10-CM | POA: Diagnosis not present

## 2016-07-26 DIAGNOSIS — Z23 Encounter for immunization: Secondary | ICD-10-CM | POA: Diagnosis not present

## 2016-07-27 ENCOUNTER — Ambulatory Visit (INDEPENDENT_AMBULATORY_CARE_PROVIDER_SITE_OTHER): Payer: Medicare Other | Admitting: *Deleted

## 2016-07-27 DIAGNOSIS — I4891 Unspecified atrial fibrillation: Secondary | ICD-10-CM | POA: Diagnosis not present

## 2016-07-27 DIAGNOSIS — Z5181 Encounter for therapeutic drug level monitoring: Secondary | ICD-10-CM

## 2016-07-27 LAB — POCT INR: INR: 1.4

## 2016-07-28 DIAGNOSIS — Z23 Encounter for immunization: Secondary | ICD-10-CM | POA: Diagnosis not present

## 2016-07-28 DIAGNOSIS — D631 Anemia in chronic kidney disease: Secondary | ICD-10-CM | POA: Diagnosis not present

## 2016-07-28 DIAGNOSIS — N186 End stage renal disease: Secondary | ICD-10-CM | POA: Diagnosis not present

## 2016-07-28 DIAGNOSIS — I4891 Unspecified atrial fibrillation: Secondary | ICD-10-CM | POA: Diagnosis not present

## 2016-07-28 DIAGNOSIS — N2581 Secondary hyperparathyroidism of renal origin: Secondary | ICD-10-CM | POA: Diagnosis not present

## 2016-07-30 DIAGNOSIS — N186 End stage renal disease: Secondary | ICD-10-CM | POA: Diagnosis not present

## 2016-07-30 DIAGNOSIS — I4891 Unspecified atrial fibrillation: Secondary | ICD-10-CM | POA: Diagnosis not present

## 2016-07-30 DIAGNOSIS — N2581 Secondary hyperparathyroidism of renal origin: Secondary | ICD-10-CM | POA: Diagnosis not present

## 2016-07-30 DIAGNOSIS — Z23 Encounter for immunization: Secondary | ICD-10-CM | POA: Diagnosis not present

## 2016-07-30 DIAGNOSIS — D631 Anemia in chronic kidney disease: Secondary | ICD-10-CM | POA: Diagnosis not present

## 2016-08-01 ENCOUNTER — Inpatient Hospital Stay (HOSPITAL_BASED_OUTPATIENT_CLINIC_OR_DEPARTMENT_OTHER)
Admission: EM | Admit: 2016-08-01 | Discharge: 2016-08-05 | DRG: 640 | Disposition: A | Discharge status: Home / Self Care | Payer: Medicare Other | Attending: Internal Medicine | Admitting: Internal Medicine

## 2016-08-01 ENCOUNTER — Ambulatory Visit (INDEPENDENT_AMBULATORY_CARE_PROVIDER_SITE_OTHER): Payer: Medicare Other | Admitting: Medical

## 2016-08-01 ENCOUNTER — Encounter (HOSPITAL_BASED_OUTPATIENT_CLINIC_OR_DEPARTMENT_OTHER): Payer: Self-pay | Admitting: Emergency Medicine

## 2016-08-01 DIAGNOSIS — I10 Essential (primary) hypertension: Secondary | ICD-10-CM | POA: Diagnosis not present

## 2016-08-01 DIAGNOSIS — E875 Hyperkalemia: Secondary | ICD-10-CM | POA: Diagnosis not present

## 2016-08-01 DIAGNOSIS — Z8261 Family history of arthritis: Secondary | ICD-10-CM | POA: Diagnosis not present

## 2016-08-01 DIAGNOSIS — M898X9 Other specified disorders of bone, unspecified site: Secondary | ICD-10-CM | POA: Diagnosis present

## 2016-08-01 DIAGNOSIS — M069 Rheumatoid arthritis, unspecified: Secondary | ICD-10-CM | POA: Diagnosis present

## 2016-08-01 DIAGNOSIS — Z5329 Procedure and treatment not carried out because of patient's decision for other reasons: Secondary | ICD-10-CM

## 2016-08-01 DIAGNOSIS — Z96641 Presence of right artificial hip joint: Secondary | ICD-10-CM | POA: Diagnosis present

## 2016-08-01 DIAGNOSIS — E1122 Type 2 diabetes mellitus with diabetic chronic kidney disease: Secondary | ICD-10-CM | POA: Diagnosis present

## 2016-08-01 DIAGNOSIS — I482 Chronic atrial fibrillation: Secondary | ICD-10-CM | POA: Diagnosis present

## 2016-08-01 DIAGNOSIS — I132 Hypertensive heart and chronic kidney disease with heart failure and with stage 5 chronic kidney disease, or end stage renal disease: Secondary | ICD-10-CM | POA: Diagnosis present

## 2016-08-01 DIAGNOSIS — D631 Anemia in chronic kidney disease: Secondary | ICD-10-CM | POA: Diagnosis present

## 2016-08-01 DIAGNOSIS — A084 Viral intestinal infection, unspecified: Secondary | ICD-10-CM | POA: Diagnosis present

## 2016-08-01 DIAGNOSIS — Z888 Allergy status to other drugs, medicaments and biological substances status: Secondary | ICD-10-CM

## 2016-08-01 DIAGNOSIS — Z7901 Long term (current) use of anticoagulants: Secondary | ICD-10-CM | POA: Diagnosis not present

## 2016-08-01 DIAGNOSIS — N186 End stage renal disease: Secondary | ICD-10-CM

## 2016-08-01 DIAGNOSIS — G4733 Obstructive sleep apnea (adult) (pediatric): Secondary | ICD-10-CM

## 2016-08-01 DIAGNOSIS — E1129 Type 2 diabetes mellitus with other diabetic kidney complication: Secondary | ICD-10-CM | POA: Diagnosis not present

## 2016-08-01 DIAGNOSIS — Z8249 Family history of ischemic heart disease and other diseases of the circulatory system: Secondary | ICD-10-CM | POA: Diagnosis not present

## 2016-08-01 DIAGNOSIS — R001 Bradycardia, unspecified: Secondary | ICD-10-CM | POA: Diagnosis present

## 2016-08-01 DIAGNOSIS — I5042 Chronic combined systolic (congestive) and diastolic (congestive) heart failure: Secondary | ICD-10-CM | POA: Diagnosis not present

## 2016-08-01 DIAGNOSIS — Z992 Dependence on renal dialysis: Secondary | ICD-10-CM | POA: Diagnosis not present

## 2016-08-01 DIAGNOSIS — I120 Hypertensive chronic kidney disease with stage 5 chronic kidney disease or end stage renal disease: Secondary | ICD-10-CM | POA: Diagnosis not present

## 2016-08-01 DIAGNOSIS — I4891 Unspecified atrial fibrillation: Secondary | ICD-10-CM | POA: Diagnosis present

## 2016-08-01 DIAGNOSIS — R109 Unspecified abdominal pain: Secondary | ICD-10-CM

## 2016-08-01 DIAGNOSIS — Z452 Encounter for adjustment and management of vascular access device: Secondary | ICD-10-CM | POA: Diagnosis not present

## 2016-08-01 DIAGNOSIS — N2581 Secondary hyperparathyroidism of renal origin: Secondary | ICD-10-CM | POA: Diagnosis not present

## 2016-08-01 LAB — COMPREHENSIVE METABOLIC PANEL
ALT: 18 U/L (ref 17–63)
ANION GAP: 11 (ref 5–15)
AST: 41 U/L (ref 15–41)
Albumin: 4.2 g/dL (ref 3.5–5.0)
Alkaline Phosphatase: 152 U/L — ABNORMAL HIGH (ref 38–126)
BUN: 57 mg/dL — ABNORMAL HIGH (ref 6–20)
CHLORIDE: 102 mmol/L (ref 101–111)
CO2: 24 mmol/L (ref 22–32)
CREATININE: 9.3 mg/dL — AB (ref 0.61–1.24)
Calcium: 10.1 mg/dL (ref 8.9–10.3)
GFR calc Af Amer: 6 mL/min — ABNORMAL LOW (ref >60)
GFR, EST NON AFRICAN AMERICAN: 5 mL/min — AB (ref >60)
Glucose, Bld: 117 mg/dL — ABNORMAL HIGH (ref 65–99)
Potassium: 8.7 mmol/L (ref 3.5–5.1)
Sodium: 137 mmol/L (ref 135–145)
Total Bilirubin: 0.4 mg/dL (ref 0.3–1.2)
Total Protein: 8.6 g/dL — ABNORMAL HIGH (ref 6.5–8.1)

## 2016-08-01 LAB — CBC WITH DIFFERENTIAL/PLATELET
Basophils Absolute: 0 10*3/uL (ref 0.0–0.1)
Basophils Relative: 0 %
EOS ABS: 0.1 10*3/uL (ref 0.0–0.7)
EOS PCT: 1 %
HCT: 37.3 % — ABNORMAL LOW (ref 39.0–52.0)
Hemoglobin: 11.6 g/dL — ABNORMAL LOW (ref 13.0–17.0)
LYMPHS ABS: 1.5 10*3/uL (ref 0.7–4.0)
LYMPHS PCT: 22 %
MCH: 29.8 pg (ref 26.0–34.0)
MCHC: 31.1 g/dL (ref 30.0–36.0)
MCV: 95.9 fL (ref 78.0–100.0)
MONOS PCT: 7 %
Monocytes Absolute: 0.5 10*3/uL (ref 0.1–1.0)
Neutro Abs: 4.9 10*3/uL (ref 1.7–7.7)
Neutrophils Relative %: 70 %
PLATELETS: 223 10*3/uL (ref 150–400)
RBC: 3.89 MIL/uL — AB (ref 4.22–5.81)
RDW: 15.3 % (ref 11.5–15.5)
WBC: 7 10*3/uL (ref 4.0–10.5)

## 2016-08-01 LAB — PROTIME-INR
INR: 2.1
PROTHROMBIN TIME: 23.9 s — AB (ref 11.4–15.2)

## 2016-08-01 LAB — BASIC METABOLIC PANEL
ANION GAP: 13 (ref 5–15)
BUN: 54 mg/dL — AB (ref 6–20)
CALCIUM: 10 mg/dL (ref 8.9–10.3)
CO2: 26 mmol/L (ref 22–32)
Chloride: 100 mmol/L — ABNORMAL LOW (ref 101–111)
Creatinine, Ser: 9.85 mg/dL — ABNORMAL HIGH (ref 0.61–1.24)
GFR calc Af Amer: 5 mL/min — ABNORMAL LOW (ref >60)
GFR, EST NON AFRICAN AMERICAN: 5 mL/min — AB (ref >60)
GLUCOSE: 99 mg/dL (ref 65–99)
Potassium: 7.1 mmol/L (ref 3.5–5.1)
SODIUM: 139 mmol/L (ref 135–145)

## 2016-08-01 LAB — POTASSIUM: Potassium: 3.6 mmol/L (ref 3.5–5.1)

## 2016-08-01 LAB — LACTIC ACID, PLASMA: Lactic Acid, Venous: 1.3 mmol/L (ref 0.5–1.9)

## 2016-08-01 MED ORDER — CALCIUM CHLORIDE 10 % IV SOLN
1.0000 g | Freq: Once | INTRAVENOUS | Status: AC
  Administered 2016-08-01: 1 g via INTRAVENOUS
  Filled 2016-08-01: qty 10

## 2016-08-01 MED ORDER — CALCIUM CHLORIDE 10 % IV SOLN
INTRAVENOUS | Status: AC
  Filled 2016-08-01: qty 10

## 2016-08-01 MED ORDER — SODIUM CHLORIDE 0.9 % IV SOLN
INTRAVENOUS | Status: AC
  Filled 2016-08-01: qty 10

## 2016-08-01 MED ORDER — HEPARIN SODIUM (PORCINE) 1000 UNIT/ML DIALYSIS
100.0000 [IU]/kg | INTRAMUSCULAR | Status: DC | PRN
  Filled 2016-08-01: qty 11

## 2016-08-01 MED ORDER — ONDANSETRON HCL 4 MG/2ML IJ SOLN
4.0000 mg | Freq: Once | INTRAMUSCULAR | Status: AC
  Administered 2016-08-01: 4 mg via INTRAVENOUS
  Filled 2016-08-01: qty 2

## 2016-08-01 MED ORDER — LIDOCAINE-PRILOCAINE 2.5-2.5 % EX CREA: 1.0000 "application " | TOPICAL_CREAM | CUTANEOUS | Status: DC | PRN

## 2016-08-01 MED ORDER — WARFARIN - PHARMACIST DOSING INPATIENT: Freq: Every day | Status: DC

## 2016-08-01 MED ORDER — SODIUM CHLORIDE 0.9 % IV SOLN: 100.0000 mL | INTRAVENOUS | Status: DC | PRN

## 2016-08-01 MED ORDER — PENTAFLUOROPROP-TETRAFLUOROETH EX AERO: 1.0000 "application " | INHALATION_SPRAY | CUTANEOUS | Status: DC | PRN

## 2016-08-01 MED ORDER — SODIUM BICARBONATE 8.4 % IV SOLN
INTRAVENOUS | Status: AC
  Administered 2016-08-01: 50 meq via INTRAVENOUS
  Filled 2016-08-01: qty 50

## 2016-08-01 MED ORDER — SODIUM POLYSTYRENE SULFONATE 15 GM/60ML PO SUSP
60.0000 g | Freq: Once | ORAL | Status: AC
  Administered 2016-08-01: 60 g via ORAL
  Filled 2016-08-01: qty 240

## 2016-08-01 MED ORDER — SODIUM BICARBONATE 8.4 % IV SOLN
50.0000 meq | Freq: Once | INTRAVENOUS | Status: DC
  Filled 2016-08-01: qty 50

## 2016-08-01 MED ORDER — DEXTROSE 50 % IV SOLN
1.0000 | Freq: Once | INTRAVENOUS | Status: AC
  Administered 2016-08-01: 50 mL via INTRAVENOUS
  Filled 2016-08-01: qty 50

## 2016-08-01 MED ORDER — LORAZEPAM 2 MG/ML IJ SOLN
0.5000 mg | Freq: Once | INTRAMUSCULAR | Status: AC
  Administered 2016-08-01: 0.5 mg via INTRAVENOUS
  Filled 2016-08-01: qty 1

## 2016-08-01 MED ORDER — HEPARIN SODIUM (PORCINE) 1000 UNIT/ML DIALYSIS
1000.0000 [IU] | INTRAMUSCULAR | Status: DC | PRN
Start: 2016-08-01 — End: 2016-08-02

## 2016-08-01 MED ORDER — INSULIN ASPART 100 UNIT/ML IV SOLN
10.0000 [IU] | Freq: Once | INTRAVENOUS | Status: AC
  Administered 2016-08-01: 10 [IU] via INTRAVENOUS
  Filled 2016-08-01: qty 1

## 2016-08-01 MED ORDER — ALTEPLASE 2 MG IJ SOLR: 2.0000 mg | Freq: Once | INTRAMUSCULAR | Status: DC | PRN

## 2016-08-01 MED ORDER — SODIUM BICARBONATE 8.4 % IV SOLN
50.0000 meq | Freq: Once | INTRAVENOUS | Status: AC
  Administered 2016-08-01: 50 meq via INTRAVENOUS

## 2016-08-01 MED ORDER — LIDOCAINE HCL (PF) 1 % IJ SOLN: 5.0000 mL | INTRAMUSCULAR | Status: DC | PRN

## 2016-08-01 NOTE — ED Notes (Signed)
Pt now states he took immodium last night for frequent bm's.

## 2016-08-01 NOTE — ED Notes (Addendum)
Pt states that he would like to leave. This RN expressed and encouraged patient to stay. Medications in hand to be administered. Pt expresses "I have been waiting here and nothings being done, I could just take myself down to the hospital." PA asked come to bedside. Charge RN also made aware.

## 2016-08-01 NOTE — Progress Notes (Addendum)
ANTICOAGULATION CONSULT NOTE - Initial Consult  Pharmacy Consult for warfarin Indication: atrial fibrillation  Allergies  Allergen Reactions  . Ace Inhibitors Other (See Comments)    Worsening renal insufficiency    Patient Measurements: Height: 6\' 3"  (190.5 cm) Weight: 230 lb (104.3 kg) IBW/kg (Calculated) : 84.5  Vital Signs: Temp: 98.5 F (36.9 C) (01/15 1822) Temp Source: Oral (01/15 1822) BP: 109/51 (01/15 1822) Pulse Rate: 47 (01/15 1822)  Labs:  Recent Labs  08/01/16 1027 08/01/16 1847  HGB 11.6*  --   HCT 37.3*  --   PLT 223  --   LABPROT 23.9*  --   INR 2.10  --   CREATININE 9.30* 9.85*    Estimated Creatinine Clearance: 8.7 mL/min (by C-G formula based on SCr of 9.85 mg/dL (H)).   Assessment: 74 YOM with history of AFib on warfarin, to be continued here. Also with ESRD on HD. Home dose of warfarin 7.5mg  daily- he did take a dose today.  INR on admission 2.1. Hgb 11.6, plts 223- no bleeding noted.  Goal of Therapy:  INR 2-3 Monitor platelets by anticoagulation protocol: Yes   Plan:  -no warfarin tonight as patient already took dose today -daily INR   Lauren D. Bajbus, PharmD, BCPS Clinical Pharmacist Pager: [phone] 08/01/2016 8:10 PM    08/02/2016 1:53 AM  RN called pharmacy and stated pt DID NOT have Coumadin dose on 1/15 PTA.  Pt now requesting a dose tonight.  Will order 7.5mg  dose (according to home schedule).  [employer], Pharm.D., BCPS Clinical Pharmacist Pager [phone] 08/02/2016 1:54 AM

## 2016-08-01 NOTE — Procedures (Signed)
I was present at this session.  I have reviewed the session itself and made appropriate changes.  HD via LUA AVF.  Follow k after 2 h.    Shonteria Abeln L 1/15/20189:04 PM

## 2016-08-01 NOTE — Consult Note (Signed)
Reason for Consult:Hyperkalemia Referring Physician: Dr. Marvis Repress.  Bruce Cole is an 76 y.o. male.  HPI: 76 yr male with progressive weakness, dizziness, presented to [place] before 11am.  Found to have K of 8.7, bradycardia so subsequently sent to [facility] after 3 pm.  Nephrology called at 6:32 pm for HD.  Tx with Ca, bicarb, Kayex.  Similar admit on 12/30 for same issue.  Felt some issue with AVF but not clearly just a clearance issue.  Had revision on 1/5 by Dr. Donnetta Hutching.    He denies high K foods.  Just prior to this and last admit has suffered mild abdm cramping and 2-3 loose stools.  No blood in stools, no fevers or other sx. Constitutional: as above Eyes: negative Ears, nose, mouth, throat, and face: negative Respiratory: negative Cardiovascular: negative Gastrointestinal: as above, some cramping prior to D Genitourinary:negative Integument/breast: negative Hematologic/lymphatic: anemia Musculoskeletal:negative Endocrine: sugar about 100 Allergic/Immunologic: ACEI   Dialyzes at [facility] on TTS since 5/17. Primary Nephrologist [place]. EDW 108.5 kg. HD Bath 2K, 2.25 Ca, Dialyzer 180 NR, Heparin 6000 Units. Access LUA AVF.  Past Medical History:  Diagnosis Date  . Acute blood loss anemia   . Atrial fibrillation (HCC)    Chronic  . Chronic combined systolic and diastolic heart failure ([place])   . Colon polyp 2000  . Dysrhythmia    hx  . ESRD on hemodialysis ([place])    [place]  . Essential hypertension   . Gastritis and gastroduodenitis   . GI bleed   . Gout   . Heart murmur   . History of blood transfusion   . History of kidney stones   . Nephrolithiasis   . OSA (obstructive sleep apnea) 09/02/2013    IMPRESSION :  1. Mild obstructive sleep apnea with hypopneas causing sleep fragmentation and moderate oxygen desaturation.  2. Short runs of nonsustained VT were noted. His beta blocker may need to be titrated 3. Significant PLM's were noted, the PLM arousal  index was low. Please correlate with clinical history of restless leg syndrome.  4. Sleep efficiency was poor.  RECOMMENDATION:  1. Treatment options for this degree of sleep disordered breathing include weight loss and positional therapy to avoid supine sleep 2. Consider titrating beta blocker further, defer to cardiologist 3. Patient should be cautioned against driving when sleepy.They should be asked to avoid medications with sedative side effects     . Osteoarthritis of right hip   . Pneumonia    hx 30 yrs ago  . Primary osteoarthritis of right hip   . Rheumatoid arthritis ([place])   . Thrombocytopenia ([place])     Past Surgical History:  Procedure Laterality Date  . AV FISTULA PLACEMENT Left 08/26/2015   Procedure: LEFT RADIOCEPHALIC FISTULA CREATION;  Surgeon: Rosetta Posner, MD;  Location: [place];  Service: Vascular;  Laterality: Left;  . AV FISTULA PLACEMENT Left 11/23/2015   Procedure: ARTERIOVENOUS (AV) FISTULA CREATION;  Surgeon: Rosetta Posner, MD;  Location: [place];  Service: Vascular;  Laterality: Left;  . BACK SURGERY     x2- discectomy  . CHOLECYSTECTOMY  1994  . CYSTOSCOPY/RETROGRADE/URETEROSCOPY/STONE EXTRACTION WITH BASKET    . ESOPHAGOGASTRODUODENOSCOPY N/A 11/13/2015   Procedure: ESOPHAGOGASTRODUODENOSCOPY (EGD);  Surgeon: Irene Shipper, MD;  [facility] ENDOSCOPY;  Service: Endoscopy;  Laterality: N/A;  . INSERTION OF DIALYSIS CATHETER Left 11/23/2015   Procedure: INSERTION OF DIALYSIS CATHETER;  Surgeon: Rosetta Posner, MD;  Location: [place];  Service:  Vascular;  Laterality: Left;  . JOINT REPLACEMENT     Total L-Hip replacement, Right Knee 10/20/09  . LEFT AND RIGHT HEART CATHETERIZATION WITH CORONARY ANGIOGRAM N/A 02/22/2013   Procedure: LEFT AND RIGHT HEART CATHETERIZATION WITH CORONARY ANGIOGRAM;  Surgeon: Jolaine Artist, MD;  Location: [facility] CATH LAB;  Service: Cardiovascular;  Laterality: N/A;  . LITHOTRIPSY  90's  . PERIPHERAL VASCULAR CATHETERIZATION Left 11/19/2015   Procedure:  A/V/Fistulagram;  Surgeon: Conrad Salado, MD;  Location: [place] CV LAB;  Service: Cardiovascular;  Laterality: Left;  . PERIPHERAL VASCULAR CATHETERIZATION Left 07/21/2016   Procedure: A/V Fistulagram;  Surgeon: Conrad Ironton, MD;  Location: [place] CV LAB;  Service: Cardiovascular;  Laterality: Left;  arm  . REVISON OF ARTERIOVENOUS FISTULA Left 05/30/2016   Procedure: REVISION LEFT UPPER ARM FISTULA;  Surgeon: Conrad , MD;  Location: [place];  Service: Vascular;  Laterality: Left;  . REVISON OF ARTERIOVENOUS FISTULA Left 07/22/2016   Procedure: REVISON OF BASILIC VEIN TRANSPOSITION ANASTOMOSIS;  Surgeon: Rosetta Posner, MD;  Location: [place];  Service: Vascular;  Laterality: Left;  . SPINE SURGERY     x 2  . TOTAL HIP ARTHROPLASTY Right 03/22/2016   Procedure: RIGHT TOTAL HIP ARTHROPLASTY ANTERIOR APPROACH;  Surgeon: Mcarthur Rossetti, MD;  Location: [place];  Service: Orthopedics;  Laterality: Right;    Family History  Problem Relation Age of Onset  . Hypertension Mother   . Arthritis Mother     ?RA  . Hypertension Father     Social History:  reports that he has never smoked. He has never used smokeless tobacco. He reports that he does not drink alcohol or use drugs.  Allergies:  Allergies  Allergen Reactions  . Ace Inhibitors Other (See Comments)    Worsening renal insufficiency    Medications:  I have reviewed the patient's current medications. Prior to Admission:  Prescriptions Prior to Admission  Medication Sig Dispense Refill Last Dose  . acetaminophen (TYLENOL) 500 MG tablet Take 500 mg by mouth every 6 (six) hours as needed for mild pain.    at prn  . albuterol (PROVENTIL) (2.5 MG/3ML) 0.083% nebulizer solution Take 2.5 mg by nebulization every 4 (four) hours as needed for shortness of breath. Reported on 09/25/2015    at prn  . allopurinol (ZYLOPRIM) 100 MG tablet Take 2 tablets (200 mg total) by mouth daily. 30 tablet 0 07/31/2016 at Unknown time  . B  Complex-C-Folic Acid (RENA-VITE RX) 1 MG TABS Take 1 tablet by mouth at bedtime.   6 08/01/2016 at Unknown time  . fluticasone (FLONASE) 50 MCG/ACT nasal spray Place 2 sprays into both nostrils daily. (Patient taking differently: Place 2 sprays into both nostrils daily as needed for allergies. ) 16 g 6  at prn  . hydroxychloroquine (PLAQUENIL) 200 MG tablet Take 200 mg by mouth 2 (two) times daily. Reported on 01/13/2016   Past Week at Unknown time  . RENVELA 800 MG tablet Take 3 tablets (2,400 mg total) by mouth 3 (three) times daily with meals. 90 tablet 0 Past Week at Unknown time  . warfarin (COUMADIN) 7.5 MG tablet Take 7.5 mg by mouth daily.   08/01/2016 at Unknown time  , Hectorol 2 mcg iv q HD, . Micera 75 mcg iv q 2wk   Results for orders placed or performed during the hospital encounter of 08/01/16 (from the past 48 hour(s))  CBC with Differential/Platelet     Status: Abnormal   Collection  Time: 08/01/16 10:27 AM  Result Value Ref Range   WBC 7.0 4.0 - 10.5 K/uL   RBC 3.89 (L) 4.22 - 5.81 MIL/uL   Hemoglobin 11.6 (L) 13.0 - 17.0 g/dL   HCT 37.3 (L) 39.0 - 52.0 %   MCV 95.9 78.0 - 100.0 fL   MCH 29.8 26.0 - 34.0 pg   MCHC 31.1 30.0 - 36.0 g/dL   RDW 15.3 11.5 - 15.5 %   Platelets 223 150 - 400 K/uL   Neutrophils Relative % 70 %   Neutro Abs 4.9 1.7 - 7.7 K/uL   Lymphocytes Relative 22 %   Lymphs Abs 1.5 0.7 - 4.0 K/uL   Monocytes Relative 7 %   Monocytes Absolute 0.5 0.1 - 1.0 K/uL   Eosinophils Relative 1 %   Eosinophils Absolute 0.1 0.0 - 0.7 K/uL   Basophils Relative 0 %   Basophils Absolute 0.0 0.0 - 0.1 K/uL  Comprehensive metabolic panel     Status: Abnormal   Collection Time: 08/01/16 10:27 AM  Result Value Ref Range   Sodium 137 135 - 145 mmol/L   Potassium 8.7 (HH) 3.5 - 5.1 mmol/L    Comment: REPEATED TO VERIFY NO VISIBLE HEMOLYSIS JOSS BENTON RN '@1120'  08/01/2016 OLSONM    Chloride 102 101 - 111 mmol/L   CO2 24 22 - 32 mmol/L   Glucose, Bld 117 (H) 65 - 99  mg/dL   BUN 57 (H) 6 - 20 mg/dL   Creatinine, Ser 9.30 (H) 0.61 - 1.24 mg/dL   Calcium 10.1 8.9 - 10.3 mg/dL   Total Protein 8.6 (H) 6.5 - 8.1 g/dL   Albumin 4.2 3.5 - 5.0 g/dL   AST 41 15 - 41 U/L   ALT 18 17 - 63 U/L   Alkaline Phosphatase 152 (H) 38 - 126 U/L   Total Bilirubin 0.4 0.3 - 1.2 mg/dL   GFR calc non Af Amer 5 (L) >60 mL/min   GFR calc Af Amer 6 (L) >60 mL/min    Comment: (NOTE) The eGFR has been calculated using the CKD EPI equation. This calculation has not been validated in all clinical situations. eGFR's persistently <60 mL/min signify possible Chronic Kidney Disease.    Anion gap 11 5 - 15  Protime-INR     Status: Abnormal   Collection Time: 08/01/16 10:27 AM  Result Value Ref Range   Prothrombin Time 23.9 (H) 11.4 - 15.2 seconds   INR 4.25   Basic metabolic panel     Status: Abnormal   Collection Time: 08/01/16  6:47 PM  Result Value Ref Range   Sodium 139 135 - 145 mmol/L   Potassium 7.1 (HH) 3.5 - 5.1 mmol/L    Comment: NO VISIBLE HEMOLYSIS CRITICAL RESULT CALLED TO, READ BACK BY AND VERIFIED WITHLeandro Reasoner RN [phone] 1958 GREEN R    Chloride 100 (L) 101 - 111 mmol/L   CO2 26 22 - 32 mmol/L   Glucose, Bld 99 65 - 99 mg/dL   BUN 54 (H) 6 - 20 mg/dL   Creatinine, Ser 9.85 (H) 0.61 - 1.24 mg/dL   Calcium 10.0 8.9 - 10.3 mg/dL   GFR calc non Af Amer 5 (L) >60 mL/min   GFR calc Af Amer 5 (L) >60 mL/min    Comment: (NOTE) The eGFR has been calculated using the CKD EPI equation. This calculation has not been validated in all clinical situations. eGFR's persistently <60 mL/min signify possible Chronic Kidney Disease.    Anion gap  13 5 - 15    No results found.  ROS Blood pressure (!) 109/51, pulse (!) 47, temperature 98.5 F (36.9 C), temperature source Oral, resp. rate (!) 21, height '6\' 3"'  (1.905 m), weight 104.3 kg (230 lb), SpO2 98 %. Physical Exam Physical Examination: General appearance - alert, well appearing, and in no distress Mental  status - alert, oriented to person, place, and time Eyes - DM retinal dz Mouth - mucous membranes moist, pharynx normal without lesions Neck - adenopathy noted PCL Lymphatics - posterior cervical nodes Chest - clear to auscultation, no wheezes, rales or rhonchi, symmetric air entry Heart - S1 and S2 normal, S4 present, systolic murmur AI9/0 at 2nd left intercostal space Abdomen - obese, pos bs, soft, nontender Musculoskeletal - no joint tenderness, deformity or swelling Extremities - no pedal edema noted, aVF LUA B&T Skin - stasis changes LE, dry skin  Assessment/Plan: 1 ^^K cause puzzling, ?AVF issues again. Will need repeat fistulogram.  ?? ^ source K, ? Bowel, not classic exam 2 ESRD: will do  HD to lower K, and F/U 3 Hypertension: not an issue 4. Anemia of ESRD: hold off ESA 5. Metabolic Bone Disease: cont Vit D 6 DM control 7 ?RA P HD with O K, follow K,  fistulogram  Zamaria Brazzle L 08/01/2016, 7:59 PM

## 2016-08-01 NOTE — Progress Notes (Signed)
   Patient coming from [place] in need of emergent dialysis. Accepted to telemetry bed.   Patient with notable chemistry abnormalities including potassium 8.7, glucose 117, BUN 57, creatinine 9.3, alkaline phosphatase 152. Patient with several day history of diarrhea and found to be symptomatically bradycardic. Ongoing diarrhea has been an issue for pt during previous admissions. Patient's typical dialysis days are Tuesday Thursday and Saturday.    Linna Darner, MD [facility] 08/01/2016, 12:30 PM

## 2016-08-01 NOTE — ED Triage Notes (Signed)
Pt by POV with weakness in extremities since Saturday (last dialysis treatment). Reports he has continued to have diarrhea x3 today and weakness which was the cause of his last admission. Pt ambulated to room x1 assist with walker.   Reports recent antibiotic use. With X5 episodes of diarrhea in the last 24 hrs.

## 2016-08-01 NOTE — ED Provider Notes (Signed)
[place] DEPT [facility] Provider Note   CSN: [id number] Arrival date & time: 08/01/16  0944     History   Chief Complaint Chief Complaint  Patient presents with  . Weakness    HPI Bruce Cole is a 76 y.o. male.  The history is provided by the patient. No language interpreter was used.  Weakness  Primary symptoms include dizziness. This is a new problem. The current episode started more than 1 week ago. The problem has been gradually worsening. There was no focality noted. Pertinent negatives include no shortness of breath, no chest pain and no vomiting. Associated medical issues do not include a clotting disorder.  Pt reports he had surgery for vascular access on 1/5.  Pt reports he developed diarrhea a week ago. Pt report 5 episodes of diarrhea in the last 24 hours.  Pt complains of feeling weak.    Past Medical History:  Diagnosis Date  . Acute blood loss anemia   . Atrial fibrillation (HCC)    Chronic  . Chronic combined systolic and diastolic heart failure ([place])   . Colon polyp 2000  . Dysrhythmia    hx  . ESRD on hemodialysis ([place])    [place]  . Essential hypertension   . Gastritis and gastroduodenitis   . GI bleed   . Gout   . Heart murmur   . History of blood transfusion   . History of kidney stones   . Nephrolithiasis   . OSA (obstructive sleep apnea) 09/02/2013    IMPRESSION :  1. Mild obstructive sleep apnea with hypopneas causing sleep fragmentation and moderate oxygen desaturation.  2. Short runs of nonsustained VT were noted. His beta blocker may need to be titrated 3. Significant PLM's were noted, the PLM arousal index was low. Please correlate with clinical history of restless leg syndrome.  4. Sleep efficiency was poor.  RECOMMENDATION:  1. Treatment options for this degree of sleep disordered breathing include weight loss and positional therapy to avoid supine sleep 2. Consider titrating beta blocker further, defer to cardiologist  3. Patient should be cautioned against driving when sleepy.They should be asked to avoid medications with sedative side effects     . Osteoarthritis of right hip   . Pneumonia    hx 30 yrs ago  . Primary osteoarthritis of right hip   . Rheumatoid arthritis ([place])   . Thrombocytopenia [facility])     Patient Active Problem List   Diagnosis Date Noted  . Acute hyperkalemia 07/16/2016  . Aftercare following surgery of the circulatory system 06/17/2016  . Status post total replacement of right hip 03/22/2016  . ESRD on dialysis ([place]) 11/27/2015  . Encounter for therapeutic drug monitoring 09/02/2013  . OSA (obstructive sleep apnea) 09/02/2013  . Chronic combined systolic and diastolic heart failure ([place]) 03/16/2013  . Allergic rhinitis 12/18/2012  . Long term current use of anticoagulant therapy 11/25/2010  . Genital herpes 05/31/2010  . DM (diabetes mellitus), type 2 with renal complications ([place]) 24/26/8341  . ERECTILE DYSFUNCTION, ORGANIC 09/21/2009  . Osteoarthritis 09/21/2009  . PERSONAL HX COLONIC POLYPS 08/25/2009  . Gout 07/22/2009  . Essential hypertension 07/22/2009  . ATRIAL FIBRILLATION 07/22/2009  . Rheumatoid arthritis ([place]) 07/22/2009  . NEPHROLITHIASIS, HX OF 07/22/2009    Past Surgical History:  Procedure Laterality Date  . AV FISTULA PLACEMENT Left 08/26/2015   Procedure: LEFT RADIOCEPHALIC FISTULA CREATION;  Surgeon: Rosetta Posner, MD;  Location: [place];  Service: Vascular;  Laterality: Left;  .  AV FISTULA PLACEMENT Left 11/23/2015   Procedure: ARTERIOVENOUS (AV) FISTULA CREATION;  Surgeon: Rosetta Posner, MD;  Location: [place];  Service: Vascular;  Laterality: Left;  . BACK SURGERY     x2- discectomy  . CHOLECYSTECTOMY  1994  . CYSTOSCOPY/RETROGRADE/URETEROSCOPY/STONE EXTRACTION WITH BASKET    . ESOPHAGOGASTRODUODENOSCOPY N/A 11/13/2015   Procedure: ESOPHAGOGASTRODUODENOSCOPY (EGD);  Surgeon: Irene Shipper, MD;  Location: [facility] ENDOSCOPY;  Service: Endoscopy;  Laterality: N/A;    . INSERTION OF DIALYSIS CATHETER Left 11/23/2015   Procedure: INSERTION OF DIALYSIS CATHETER;  Surgeon: Rosetta Posner, MD;  Location: [place];  Service: Vascular;  Laterality: Left;  . JOINT REPLACEMENT     Total L-Hip replacement, Right Knee 10/20/09  . LEFT AND RIGHT HEART CATHETERIZATION WITH CORONARY ANGIOGRAM N/A 02/22/2013   Procedure: LEFT AND RIGHT HEART CATHETERIZATION WITH CORONARY ANGIOGRAM;  Surgeon: Jolaine Artist, MD;  Location: [facility] CATH LAB;  Service: Cardiovascular;  Laterality: N/A;  . LITHOTRIPSY  90's  . PERIPHERAL VASCULAR CATHETERIZATION Left 11/19/2015   Procedure: A/V/Fistulagram;  Surgeon: Conrad Energy, MD;  Location: [place] CV LAB;  Service: Cardiovascular;  Laterality: Left;  . PERIPHERAL VASCULAR CATHETERIZATION Left 07/21/2016   Procedure: A/V Fistulagram;  Surgeon: Conrad [place], MD;  Location: [place] CV LAB;  Service: Cardiovascular;  Laterality: Left;  arm  . REVISON OF ARTERIOVENOUS FISTULA Left 05/30/2016   Procedure: REVISION LEFT UPPER ARM FISTULA;  Surgeon: Conrad [place], MD;  Location: [place];  Service: Vascular;  Laterality: Left;  . REVISON OF ARTERIOVENOUS FISTULA Left 07/22/2016   Procedure: REVISON OF BASILIC VEIN TRANSPOSITION ANASTOMOSIS;  Surgeon: Rosetta Posner, MD;  Location: [place];  Service: Vascular;  Laterality: Left;  . SPINE SURGERY     x 2  . TOTAL HIP ARTHROPLASTY Right 03/22/2016   Procedure: RIGHT TOTAL HIP ARTHROPLASTY ANTERIOR APPROACH;  Surgeon: Mcarthur Rossetti, MD;  Location: [place];  Service: Orthopedics;  Laterality: Right;       Home Medications    Prior to Admission medications   Medication Sig Start Date End Date Taking? Authorizing Provider  allopurinol (ZYLOPRIM) 100 MG tablet Take 2 tablets (200 mg total) by mouth daily. 11/25/15  Yes Robbie Lis, MD  B Complex-C-Folic Acid (RENA-VITE RX) 1 MG TABS Take 1 tablet by mouth at bedtime.  02/18/16  Yes Historical Provider, MD  hydroxychloroquine (PLAQUENIL) 200 MG tablet Take  200 mg by mouth 2 (two) times daily. Reported on 01/13/2016 09/11/14  Yes Bo Merino, MD  RENVELA 800 MG tablet Take 3 tablets (2,400 mg total) by mouth 3 (three) times daily with meals. 07/23/16  Yes Lavina Hamman, MD  warfarin (COUMADIN) 7.5 MG tablet Take 7.5 mg by mouth daily.   Yes Historical Provider, MD  acetaminophen (TYLENOL) 500 MG tablet Take 500 mg by mouth every 6 (six) hours as needed for mild pain.    Historical Provider, MD  albuterol (PROVENTIL) (2.5 MG/3ML) 0.083% nebulizer solution Take 2.5 mg by nebulization every 4 (four) hours as needed for shortness of breath. Reported on 09/25/2015    Historical Provider, MD  fluticasone (FLONASE) 50 MCG/ACT nasal spray Place 2 sprays into both nostrils daily. Patient taking differently: Place 2 sprays into both nostrils daily as needed for allergies.  03/18/15   Debbrah Alar, NP    Family History Family History  Problem Relation Age of Onset  . Hypertension Mother   . Arthritis Mother     ?RA  . Hypertension  Father     Social History Social History  Substance Use Topics  . Smoking status: Never Smoker  . Smokeless tobacco: Never Used  . Alcohol use No     Comment: occasional     Allergies   Ace inhibitors   Review of Systems Review of Systems  Respiratory: Negative for shortness of breath.   Cardiovascular: Negative for chest pain.  Gastrointestinal: Negative for vomiting.  Neurological: Positive for dizziness and weakness.  All other systems reviewed and are negative.    Physical Exam Updated Vital Signs BP 138/55 (BP Location: Right Arm)   Pulse (!) 55   Temp 98 F (36.7 C) (Oral)   Resp 18   Ht 6\' 3"  (1.905 m)   Wt 104.3 kg   SpO2 99%   BMI 28.75 kg/m   Physical Exam  Constitutional: He appears well-developed and well-nourished.  HENT:  Head: Normocephalic and atraumatic.  Eyes: Conjunctivae are normal.  Neck: Neck supple.  Cardiovascular: Normal rate and regular rhythm.   No murmur  heard. Pulmonary/Chest: Effort normal and breath sounds normal. No respiratory distress.  Abdominal: Soft. There is no tenderness.  Musculoskeletal: He exhibits no edema.  Neurological: He is alert.  Skin: Skin is warm and dry.  Psychiatric: He has a normal mood and affect.  Nursing note and vitals reviewed.    ED Treatments / Results  Labs (all labs ordered are listed, but only abnormal results are displayed) Labs Reviewed  CBC WITH DIFFERENTIAL/PLATELET - Abnormal; Notable for the following:       Result Value   RBC 3.89 (*)    Hemoglobin 11.6 (*)    HCT 37.3 (*)    All other components within normal limits  COMPREHENSIVE METABOLIC PANEL - Abnormal; Notable for the following:    Potassium 8.7 (*)    Glucose, Bld 117 (*)    BUN 57 (*)    Creatinine, Ser 9.30 (*)    Total Protein 8.6 (*)    Alkaline Phosphatase 152 (*)    GFR calc non Af Amer 5 (*)    GFR calc Af Amer 6 (*)    All other components within normal limits  PROTIME-INR - Abnormal; Notable for the following:    Prothrombin Time 23.9 (*)    All other components within normal limits    EKG  EKG Interpretation  Date/Time:  Monday August 01 2016 10:16:27 EST Ventricular Rate:  50 PR Interval:    QRS Duration: 157 QT Interval:  441 QTC Calculation: 403 R Axis:   -81 Text Interpretation:  Junctional rhythm Nonspecific IVCD with LAD Inferior infarct, old Anterior infarct, old Lateral leads are also involved similar to prior EKG Confirmed by BELFI  MD, MELANIE ([id number]) on 08/01/2016 10:28:54 AM       Radiology No results found.  Procedures Procedures (including critical care time)  Medications Ordered in ED Medications - No data to display   Initial Impression / Assessment and Plan / ED Course  I have reviewed the triage vital signs and the nursing notes.  Pertinent labs & imaging results that were available during my care of the patient were reviewed by me and considered in my medical decision making  (see chart for details).  Clinical Course       Final Clinical Impressions(s) / ED Diagnoses   Final diagnoses:  Hyperkalemia   I spoke to Dr. Marily Memos who will admit to [facility] for [facility]  New Prescriptions New Prescriptions   No medications on  file     Fransico Meadow, PA-C 08/01/16 [place], MD 08/01/16 [phone]

## 2016-08-01 NOTE — ED Notes (Signed)
ED Provider at bedside. 

## 2016-08-01 NOTE — H&P (Signed)
KHALEE MAZO UEA:[id number] DOB: 1940-08-02 DOA: 08/01/2016     PCP: Nance Pear., NP   Outpatient Specialists: Orthopedics Silvestre Gunner Patient coming from:    home Lives alone,      Chief Complaint: fatigue and diarrhea  HPI: ANDDY WINGERT is a 76 y.o. male with medical history significant of ESRD on HD Tues,Thurdsday, Saturday at [facility], A. fib, hypertension, OSA (does not tolerate CPAP)  rheumatoid arthritis, combined systolic diastolic CHF    Presented with weakness loose stools and generalized fatigue last dialysis was Saturday 2 days ago and he had 3 days of diarrhea and generalized fatigue had similar admission in the past.  Reports he has used antibiotics recently attempted to take Imodium at home to help with his symptoms. His generalized fatigue has been gradually getting worse he denies any shortness of breath or chest pain no nausea vomiting.  Patient have had 5 episodes of diarrhea for the past 24 hours. Reports that he has had intermittent episodes of diarrhea sometimes worse after eating. He have had recent dental procedures requiring antibiotics. Last admission was in the end of December with he presented with potassium above 7.5 bicarbonate 16 creatinine 9.9 is was also she associated diarrhea his hyperkalemia thought to be secondary to dietary indiscretion. At that time he required emergency dialysis.  Regarding pertinent Chronic problems: Patient has long-standing end-stage renal disease had difficulty cannulating the left arm basilic vein and had revision of basilic vein transposition done on 07/22/2016 by Dr. Sherren Mocha  re hx of a.fib and CHF patient has been followed by cardiology  Echo    03/2013 EF 45-50% with grade II diastolic dysfunction, moderate to severe RV dilation with mild systolic dysfunction, PA systolic pressure 60 mmHg.  Cardiac catheterization showed minimal CAD   IN ER:  Temp (24hrs), Avg:98.3 F (36.8 C), Min:98 F (36.7 C),  Max:98.5 F (36.9 C)   potassium 8.7, glucose 117, BUN 57, creatinine 9.3 Hyperkalemia has been treated by calcium sodium bicarbonate insulin glucose and Kayexalate  with plan for patient to be transferred to [facility] for dialysis Following Medications were ordered in ER: Medications  sodium bicarbonate injection 50 mEq (50 mEq Intravenous Not Given 08/01/16 1326)  sodium chloride 0.9 % with calcium gluconate ADS Med (not administered)  calcium chloride 10 % injection (not administered)  calcium chloride 1 g in sodium chloride 0.9 % 100 mL IVPB (0 g Intravenous Stopped 08/01/16 1338)  insulin aspart (novoLOG) injection 10 Units (10 Units Intravenous Given 08/01/16 1320)  dextrose 50 % solution 50 mL (50 mLs Intravenous Given 08/01/16 1320)  sodium polystyrene (KAYEXALATE) 15 GM/60ML suspension 60 g (60 g Oral Given 08/01/16 1303)  ondansetron (ZOFRAN) injection 4 mg (4 mg Intravenous Given 08/01/16 0700)  sodium bicarbonate injection 50 mEq (50 mEq Intravenous Given 08/01/16 1325)  LORazepam (ATIVAN) injection 0.5 mg (0.5 mg Intravenous Given 08/01/16 1303)      Hospitalist was called for admission for Transfer for emergent dialysis in the setting of hyperkalemia  Review of Systems:    Pertinent positives include: diarrhea, fatigue, abdominal pain, nausea,  Constitutional:  No weight loss, night sweats, Fevers, chills,weight loss  HEENT:  No headaches, Difficulty swallowing,Tooth/dental problems,Sore throat,  No sneezing, itching, ear ache, nasal congestion, post nasal drip,  Cardio-vascular:  No chest pain, Orthopnea, PND, anasarca, dizziness, palpitations.no Bilateral lower extremity swelling  GI:  No heartburn, indigestion,  vomiting,  change in bowel habits, loss of appetite, melena, blood in stool,  hematemesis Resp:  no shortness of breath at rest. No dyspnea on exertion, No excess mucus, no productive cough, No non-productive cough, No coughing up of blood.No change in color of  mucus.No wheezing. Skin:  no rash or lesions. No jaundice GU:  no dysuria, change in color of urine, no urgency or frequency. No straining to urinate.  No flank pain.  Musculoskeletal:  No joint pain or no joint swelling. No decreased range of motion. No back pain.  Psych:  No change in mood or affect. No depression or anxiety. No memory loss.  Neuro: no localizing neurological complaints, no tingling, no weakness, no double vision, no gait abnormality, no slurred speech, no confusion  As per HPI otherwise 10 point review of systems negative.   Past Medical History: Past Medical History:  Diagnosis Date  . Acute blood loss anemia   . Atrial fibrillation (HCC)    Chronic  . Chronic combined systolic and diastolic heart failure ([place])   . Colon polyp 2000  . Dysrhythmia    hx  . ESRD on hemodialysis ([place])    [place]  . Essential hypertension   . Gastritis and gastroduodenitis   . GI bleed   . Gout   . Heart murmur   . History of blood transfusion   . History of kidney stones   . Nephrolithiasis   . OSA (obstructive sleep apnea) 09/02/2013    IMPRESSION :  1. Mild obstructive sleep apnea with hypopneas causing sleep fragmentation and moderate oxygen desaturation.  2. Short runs of nonsustained VT were noted. His beta blocker may need to be titrated 3. Significant PLM's were noted, the PLM arousal index was low. Please correlate with clinical history of restless leg syndrome.  4. Sleep efficiency was poor.  RECOMMENDATION:  1. Treatment options for this degree of sleep disordered breathing include weight loss and positional therapy to avoid supine sleep 2. Consider titrating beta blocker further, defer to cardiologist 3. Patient should be cautioned against driving when sleepy.They should be asked to avoid medications with sedative side effects     . Osteoarthritis of right hip   . Pneumonia    hx 30 yrs ago  . Primary osteoarthritis of right hip   . Rheumatoid  arthritis ([place])   . Thrombocytopenia ([place])    Past Surgical History:  Procedure Laterality Date  . AV FISTULA PLACEMENT Left 08/26/2015   Procedure: LEFT RADIOCEPHALIC FISTULA CREATION;  Surgeon: Rosetta Posner, MD;  Location: [place];  Service: Vascular;  Laterality: Left;  . AV FISTULA PLACEMENT Left 11/23/2015   Procedure: ARTERIOVENOUS (AV) FISTULA CREATION;  Surgeon: Rosetta Posner, MD;  Location: [place];  Service: Vascular;  Laterality: Left;  . BACK SURGERY     x2- discectomy  . CHOLECYSTECTOMY  1994  . CYSTOSCOPY/RETROGRADE/URETEROSCOPY/STONE EXTRACTION WITH BASKET    . ESOPHAGOGASTRODUODENOSCOPY N/A 11/13/2015   Procedure: ESOPHAGOGASTRODUODENOSCOPY (EGD);  Surgeon: Irene Shipper, MD;  Location: [facility] ENDOSCOPY;  Service: Endoscopy;  Laterality: N/A;  . INSERTION OF DIALYSIS CATHETER Left 11/23/2015   Procedure: INSERTION OF DIALYSIS CATHETER;  Surgeon: Rosetta Posner, MD;  Location: [place];  Service: Vascular;  Laterality: Left;  . JOINT REPLACEMENT     Total L-Hip replacement, Right Knee 10/20/09  . LEFT AND RIGHT HEART CATHETERIZATION WITH CORONARY ANGIOGRAM N/A 02/22/2013   Procedure: LEFT AND RIGHT HEART CATHETERIZATION WITH CORONARY ANGIOGRAM;  Surgeon: Jolaine Artist, MD;  Location: [facility] CATH LAB;  Service: Cardiovascular;  Laterality: N/A;  .  LITHOTRIPSY  90's  . PERIPHERAL VASCULAR CATHETERIZATION Left 11/19/2015   Procedure: A/V/Fistulagram;  Surgeon: Conrad Milton Mills, MD;  Location: [place] CV LAB;  Service: Cardiovascular;  Laterality: Left;  . PERIPHERAL VASCULAR CATHETERIZATION Left 07/21/2016   Procedure: A/V Fistulagram;  Surgeon: Conrad Elgin, MD;  Location: [place] CV LAB;  Service: Cardiovascular;  Laterality: Left;  arm  . REVISON OF ARTERIOVENOUS FISTULA Left 05/30/2016   Procedure: REVISION LEFT UPPER ARM FISTULA;  Surgeon: Conrad Kaylor, MD;  Location: [place];  Service: Vascular;  Laterality: Left;  . REVISON OF ARTERIOVENOUS FISTULA Left 07/22/2016   Procedure: REVISON OF BASILIC  VEIN TRANSPOSITION ANASTOMOSIS;  Surgeon: Rosetta Posner, MD;  Location: [place];  Service: Vascular;  Laterality: Left;  . SPINE SURGERY     x 2  . TOTAL HIP ARTHROPLASTY Right 03/22/2016   Procedure: RIGHT TOTAL HIP ARTHROPLASTY ANTERIOR APPROACH;  Surgeon: Mcarthur Rossetti, MD;  Location: [place];  Service: Orthopedics;  Laterality: Right;     Social History:  Ambulatory   Cane or walker     reports that he has never smoked. He has never used smokeless tobacco. He reports that he does not drink alcohol or use drugs.  Allergies:   Allergies  Allergen Reactions  . Ace Inhibitors Other (See Comments)    Worsening renal insufficiency       Family History:   Family History  Problem Relation Age of Onset  . Hypertension Mother   . Arthritis Mother     ?RA  . Hypertension Father     Medications: Prior to Admission medications   Medication Sig Start Date End Date Taking? Authorizing Provider  allopurinol (ZYLOPRIM) 100 MG tablet Take 2 tablets (200 mg total) by mouth daily. 11/25/15  Yes Robbie Lis, MD  B Complex-C-Folic Acid (RENA-VITE RX) 1 MG TABS Take 1 tablet by mouth at bedtime.  02/18/16  Yes Historical Provider, MD  hydroxychloroquine (PLAQUENIL) 200 MG tablet Take 200 mg by mouth 2 (two) times daily. Reported on 01/13/2016 09/11/14  Yes Bo Merino, MD  RENVELA 800 MG tablet Take 3 tablets (2,400 mg total) by mouth 3 (three) times daily with meals. 07/23/16  Yes Lavina Hamman, MD  warfarin (COUMADIN) 7.5 MG tablet Take 7.5 mg by mouth daily.   Yes Historical Provider, MD  acetaminophen (TYLENOL) 500 MG tablet Take 500 mg by mouth every 6 (six) hours as needed for mild pain.    Historical Provider, MD  albuterol (PROVENTIL) (2.5 MG/3ML) 0.083% nebulizer solution Take 2.5 mg by nebulization every 4 (four) hours as needed for shortness of breath. Reported on 09/25/2015    Historical Provider, MD  fluticasone (FLONASE) 50 MCG/ACT nasal spray Place 2 sprays into both  nostrils daily. Patient taking differently: Place 2 sprays into both nostrils daily as needed for allergies.  03/18/15   Debbrah Alar, NP    Physical Exam: Patient Vitals for the past 24 hrs:  BP Temp Temp src Pulse Resp SpO2 Height Weight  08/01/16 1822 (!) 109/51 98.5 F (36.9 C) Oral (!) 47 (!) 21 98 % - -  08/01/16 1630 111/56 - - (!) 47 26 98 % - -  08/01/16 1337 (!) 107/53 - - (!) 47 20 100 % - -  08/01/16 1130 113/63 - - (!) 46 25 93 % - -  08/01/16 1100 111/61 - - (!) 49 26 93 % - -  08/01/16 1030 115/59 - - (!) 51 19 97 % - -  08/01/16 0951 138/55 98 F (36.7 C) Oral (!) 55 18 99 % 6\' 3"  (1.905 m) 104.3 kg (230 lb)    1. General:  in No Acute distress 2. Psychological: Alert and   Oriented 3. Head/ENT:   Moist Mucous Membranes                          Head Non traumatic, neck supple                            Poor Dentition 4. SKIN:  decreased Skin turgor,  Skin clean Dry and intact no rash 5. Heart: Regular rate and rhythm no  Murmur, Rub or gallop 6. Lungs: Clear to auscultation bilaterally, no wheezes or crackles   7. Abdomen: Soft,  non-tender, Non distended 8. Lower extremities: no clubbing, cyanosis, or edema 9. Neurologically Grossly intact, moving all 4 extremities equally   10. MSK: Normal range of motion   body mass index is 28.75 kg/m.  Labs on Admission:   Labs on Admission: I have personally reviewed following labs and imaging studies  CBC:  Recent Labs Lab 08/01/16 1027  WBC 7.0  NEUTROABS 4.9  HGB 11.6*  HCT 37.3*  MCV 95.9  PLT 378   Basic Metabolic Panel:  Recent Labs Lab 08/01/16 1027  NA 137  K 8.7*  CL 102  CO2 24  GLUCOSE 117*  BUN 57*  CREATININE 9.30*  CALCIUM 10.1   GFR: Estimated Creatinine Clearance: 9.2 mL/min (by C-G formula based on SCr of 9.3 mg/dL (H)). Liver Function Tests:  Recent Labs Lab 08/01/16 1027  AST 41  ALT 18  ALKPHOS 152*  BILITOT 0.4  PROT 8.6*  ALBUMIN 4.2   No results for  input(s): LIPASE, AMYLASE in the last 168 hours. No results for input(s): AMMONIA in the last 168 hours. Coagulation Profile:  Recent Labs Lab 07/27/16 0958 08/01/16 1027  INR 1.4 2.10   Cardiac Enzymes: No results for input(s): CKTOTAL, CKMB, CKMBINDEX, TROPONINI in the last 168 hours. BNP (last 3 results) No results for input(s): PROBNP in the last 8760 hours. HbA1C: No results for input(s): HGBA1C in the last 72 hours. CBG: No results for input(s): GLUCAP in the last 168 hours. Lipid Profile: No results for input(s): CHOL, HDL, LDLCALC, TRIG, CHOLHDL, LDLDIRECT in the last 72 hours. Thyroid Function Tests: No results for input(s): TSH, T4TOTAL, FREET4, T3FREE, THYROIDAB in the last 72 hours. Anemia Panel: No results for input(s): VITAMINB12, FOLATE, FERRITIN, TIBC, IRON, RETICCTPCT in the last 72 hours. Urine analysis:    Component Value Date/Time   COLORURINE YELLOW 11/10/2015 1800   APPEARANCEUR CLEAR 11/10/2015 1800   LABSPEC 1.012 11/10/2015 1800   PHURINE 5.5 11/10/2015 1800   GLUCOSEU NEGATIVE 11/10/2015 1800   GLUCOSEU NEGATIVE 05/05/2015 1135   HGBUR NEGATIVE 11/10/2015 1800   HGBUR negative 09/21/2009 1008   BILIRUBINUR NEGATIVE 11/10/2015 1800   KETONESUR NEGATIVE 11/10/2015 1800   PROTEINUR NEGATIVE 11/10/2015 1800   UROBILINOGEN 0.2 05/05/2015 1135   NITRITE NEGATIVE 11/10/2015 1800   LEUKOCYTESUR NEGATIVE 11/10/2015 1800   Sepsis Labs: @LABRCNTIP (procalcitonin:4,lacticidven:4) )No results found for this or any previous visit (from the past 240 hour(s)).     UA   not ordered  Lab Results  Component Value Date   HGBA1C 5.5 06/24/2016    Estimated Creatinine Clearance: 9.2 mL/min (by C-G formula based on SCr of 9.3 mg/dL (H)).  BNP (  last 3 results) No results for input(s): PROBNP in the last 8760 hours.   ECG REPORT  Independently reviewed Rate:Heart rate 46  Rhythm: Bradycardia ST&T Change: No acute ischemic changes   QTC 397 QRS  168  Filed Weights   08/01/16 0951  Weight: 104.3 kg (230 lb)     Cultures:    Component Value Date/Time   SDES FLUID PLEURAL RIGHT 11/22/2015 0916   SDES FLUID PLEURAL RIGHT 11/22/2015 0916   SPECREQUEST NONE 11/22/2015 0916   SPECREQUEST NONE 11/22/2015 0916   CULT NO GROWTH 5 DAYS 11/22/2015 0916   REPTSTATUS 11/27/2015 FINAL 11/22/2015 0916   REPTSTATUS 11/22/2015 FINAL 11/22/2015 0916     Radiological Exams on Admission: No results found.  Chart has been reviewed    Assessment/Plan  76 y.o. male with medical history significant of ESRD on HD Tues,Thurdsday, Saturday at [facility], A. fib, hypertension, OSA (does not tolerate CPAP)  rheumatoid arthritis, combined systolic diastolic CHF admitted for acute hyperkalemia requiring emergent dialysis  Present on Admission: . Hyperkalemia - discussed at length with nephrology will proceed to emergent dialysis. Per nephrology would be helpful to evaluate for possible cause of recurrent hyperkalemia  Portable workup would be to evaluate for chronic bowel ischemia given recurrent diarrhea this could be down once acute electrolyte abnormalities has resolved. Diarrhea recurrent we'll check a prescription for C. difficile given antibiotic exposure obtain gastric panel. Would benefit from contrasted CT imaging to rule out chronic ischemia if cleared by nephrology.  . OSA (obstructive sleep apnea) does not tolerate CPAP . Essential hypertension - currently appears to be stable continue to monitor blood pressure . DM (diabetes mellitus), type 2 with renal complications (HCC) - diet-controlled will monitor blood sugars with sliding scale . Chronic combined systolic and diastolic heart failure (HCC) - maintain negative dialysis currently does not appear to be fluid overloaded . ATRIAL FIBRILLATION -  .Chronic Atrial fibrillation (HCC)        - CHA2DS2 vas score  4: continue current anticoagulation with  Coumadin per pharmacy,             -  Rate control:  Currently bradycardic   Other plan as per orders.  DVT prophylaxis:  SCD      Code Status:  FULL CODE as per patient    Family Communication:   Family not at  Bedside    Disposition Plan:   To home once workup is complete and patient is stable                              Consults called: nephrology   Admission status:   inpatient  will likely require to midnight's or longer to evaluate for complex medical issues including life-threatening hyperkalemia   Level of care tele       I have spent a total of 68 min on this admission   Shye Doty 08/01/2016, 8:12 PM    [facility]  Pager [phone]   after 2 AM please page floor coverage PA If 7AM-7PM, please contact the day team taking care of the patient  [email or web address]  Password [email or web address]

## 2016-08-01 NOTE — ED Notes (Signed)
Spoke with pt regarding his concerns that his "discomfort" was not being addressed. EDPA Sofia at bedside as well. Pt encouraged to stay for admission and to allow RN to administer medications that had been ordered, which he had previously refused. Pt difficult to reason with but agreed to allow medications to be given and to stay for admission.

## 2016-08-01 NOTE — ED Notes (Signed)
[facility] notified pt has a ready bed at [facility]

## 2016-08-02 ENCOUNTER — Other Ambulatory Visit (HOSPITAL_COMMUNITY): Payer: Medicare Other

## 2016-08-02 ENCOUNTER — Inpatient Hospital Stay (HOSPITAL_COMMUNITY): Payer: Medicare Other

## 2016-08-02 DIAGNOSIS — Z992 Dependence on renal dialysis: Secondary | ICD-10-CM

## 2016-08-02 DIAGNOSIS — E875 Hyperkalemia: Principal | ICD-10-CM

## 2016-08-02 DIAGNOSIS — I5042 Chronic combined systolic (congestive) and diastolic (congestive) heart failure: Secondary | ICD-10-CM

## 2016-08-02 DIAGNOSIS — N186 End stage renal disease: Secondary | ICD-10-CM

## 2016-08-02 DIAGNOSIS — I482 Chronic atrial fibrillation: Secondary | ICD-10-CM

## 2016-08-02 LAB — COMPREHENSIVE METABOLIC PANEL
ALT: 18 U/L (ref 17–63)
ANION GAP: 12 (ref 5–15)
AST: 37 U/L (ref 15–41)
Albumin: 3.6 g/dL (ref 3.5–5.0)
Alkaline Phosphatase: 144 U/L — ABNORMAL HIGH (ref 38–126)
BUN: 27 mg/dL — ABNORMAL HIGH (ref 6–20)
CO2: 29 mmol/L (ref 22–32)
Calcium: 9.2 mg/dL (ref 8.9–10.3)
Chloride: 96 mmol/L — ABNORMAL LOW (ref 101–111)
Creatinine, Ser: 6.58 mg/dL — ABNORMAL HIGH (ref 0.61–1.24)
GFR, EST AFRICAN AMERICAN: 9 mL/min — AB (ref >60)
GFR, EST NON AFRICAN AMERICAN: 7 mL/min — AB (ref >60)
Glucose, Bld: 140 mg/dL — ABNORMAL HIGH (ref 65–99)
POTASSIUM: 4.3 mmol/L (ref 3.5–5.1)
SODIUM: 137 mmol/L (ref 135–145)
Total Bilirubin: 0.5 mg/dL (ref 0.3–1.2)
Total Protein: 7.6 g/dL (ref 6.5–8.1)

## 2016-08-02 LAB — CBC
HCT: 36.5 % — ABNORMAL LOW (ref 39.0–52.0)
Hemoglobin: 11.6 g/dL — ABNORMAL LOW (ref 13.0–17.0)
MCH: 30.1 pg (ref 26.0–34.0)
MCHC: 31.8 g/dL (ref 30.0–36.0)
MCV: 94.6 fL (ref 78.0–100.0)
PLATELETS: 212 10*3/uL (ref 150–400)
RBC: 3.86 MIL/uL — AB (ref 4.22–5.81)
RDW: 15.6 % — ABNORMAL HIGH (ref 11.5–15.5)
WBC: 6.2 10*3/uL (ref 4.0–10.5)

## 2016-08-02 LAB — GASTROINTESTINAL PANEL BY PCR, STOOL (REPLACES STOOL CULTURE)
Adenovirus F40/41: NOT DETECTED
Astrovirus: NOT DETECTED
CAMPYLOBACTER SPECIES: NOT DETECTED
CRYPTOSPORIDIUM: NOT DETECTED
Cyclospora cayetanensis: NOT DETECTED
ENTEROAGGREGATIVE E COLI (EAEC): NOT DETECTED
ENTEROPATHOGENIC E COLI (EPEC): NOT DETECTED
ENTEROTOXIGENIC E COLI (ETEC): NOT DETECTED
Entamoeba histolytica: NOT DETECTED
GIARDIA LAMBLIA: NOT DETECTED
NOROVIRUS GI/GII: NOT DETECTED
Plesimonas shigelloides: NOT DETECTED
ROTAVIRUS A: NOT DETECTED
SALMONELLA SPECIES: NOT DETECTED
SHIGA LIKE TOXIN PRODUCING E COLI (STEC): NOT DETECTED
Sapovirus (I, II, IV, and V): NOT DETECTED
Shigella/Enteroinvasive E coli (EIEC): NOT DETECTED
Vibrio cholerae: NOT DETECTED
Vibrio species: NOT DETECTED
Yersinia enterocolitica: NOT DETECTED

## 2016-08-02 LAB — MAGNESIUM: MAGNESIUM: 2.2 mg/dL (ref 1.7–2.4)

## 2016-08-02 LAB — C DIFFICILE QUICK SCREEN W PCR REFLEX
C DIFFICLE (CDIFF) ANTIGEN: NEGATIVE
C Diff interpretation: NOT DETECTED
C Diff toxin: NEGATIVE

## 2016-08-02 LAB — TSH: TSH: 1.412 u[IU]/mL (ref 0.350–4.500)

## 2016-08-02 LAB — CK: CK TOTAL: 90 U/L (ref 49–397)

## 2016-08-02 LAB — GLUCOSE, CAPILLARY
GLUCOSE-CAPILLARY: 97 mg/dL (ref 65–99)
GLUCOSE-CAPILLARY: 91 mg/dL (ref 65–99)
Glucose-Capillary: 121 mg/dL — ABNORMAL HIGH (ref 65–99)

## 2016-08-02 LAB — PROTIME-INR
INR: 2.22
Prothrombin Time: 25 seconds — ABNORMAL HIGH (ref 11.4–15.2)

## 2016-08-02 LAB — PHOSPHORUS: PHOSPHORUS: 5 mg/dL — AB (ref 2.5–4.6)

## 2016-08-02 MED ORDER — ONDANSETRON HCL 4 MG PO TABS: 4.0000 mg | ORAL_TABLET | Freq: Four times a day (QID) | ORAL | Status: DC | PRN

## 2016-08-02 MED ORDER — HYDROXYCHLOROQUINE SULFATE 200 MG PO TABS
200.0000 mg | ORAL_TABLET | Freq: Two times a day (BID) | ORAL | Status: DC
  Administered 2016-08-02 – 2016-08-04 (×6): 200 mg via ORAL
  Filled 2016-08-02 (×6): qty 1

## 2016-08-02 MED ORDER — ACETAMINOPHEN 650 MG RE SUPP: 650.0000 mg | Freq: Four times a day (QID) | RECTAL | Status: DC | PRN

## 2016-08-02 MED ORDER — ALBUTEROL SULFATE (2.5 MG/3ML) 0.083% IN NEBU: 2.5000 mg | INHALATION_SOLUTION | RESPIRATORY_TRACT | Status: DC | PRN

## 2016-08-02 MED ORDER — SODIUM CHLORIDE 0.9% FLUSH
3.0000 mL | Freq: Two times a day (BID) | INTRAVENOUS | Status: DC
  Administered 2016-08-02 – 2016-08-03 (×3): 3 mL via INTRAVENOUS

## 2016-08-02 MED ORDER — SODIUM CHLORIDE 0.9% FLUSH
3.0000 mL | Freq: Two times a day (BID) | INTRAVENOUS | Status: DC
  Administered 2016-08-02 – 2016-08-04 (×2): 3 mL via INTRAVENOUS

## 2016-08-02 MED ORDER — SODIUM CHLORIDE 0.9 % IV SOLN: 250.0000 mL | INTRAVENOUS | Status: DC | PRN

## 2016-08-02 MED ORDER — HYDROCODONE-ACETAMINOPHEN 5-325 MG PO TABS
1.0000 | ORAL_TABLET | ORAL | Status: DC | PRN
  Administered 2016-08-02 (×2): 2 via ORAL
  Filled 2016-08-02 (×3): qty 2

## 2016-08-02 MED ORDER — INSULIN ASPART 100 UNIT/ML ~~LOC~~ SOLN: 0.0000 [IU] | Freq: Three times a day (TID) | SUBCUTANEOUS | Status: DC

## 2016-08-02 MED ORDER — ALLOPURINOL 100 MG PO TABS
200.0000 mg | ORAL_TABLET | Freq: Every day | ORAL | Status: DC
  Administered 2016-08-02 – 2016-08-05 (×5): 200 mg via ORAL
  Filled 2016-08-02 (×5): qty 2

## 2016-08-02 MED ORDER — ONDANSETRON HCL 4 MG/2ML IJ SOLN: 4.0000 mg | Freq: Four times a day (QID) | INTRAMUSCULAR | Status: DC | PRN

## 2016-08-02 MED ORDER — SODIUM CHLORIDE 0.9% FLUSH: 3.0000 mL | INTRAVENOUS | Status: DC | PRN

## 2016-08-02 MED ORDER — INSULIN ASPART 100 UNIT/ML ~~LOC~~ SOLN: 0.0000 [IU] | Freq: Every day | SUBCUTANEOUS | Status: DC

## 2016-08-02 MED ORDER — WARFARIN SODIUM 5 MG PO TABS
5.0000 mg | ORAL_TABLET | Freq: Once | ORAL | Status: AC
  Administered 2016-08-02: 5 mg via ORAL
  Filled 2016-08-02: qty 1

## 2016-08-02 MED ORDER — WARFARIN SODIUM 7.5 MG PO TABS
7.5000 mg | ORAL_TABLET | ORAL | Status: AC
  Administered 2016-08-02: 7.5 mg via ORAL
  Filled 2016-08-02: qty 1

## 2016-08-02 MED ORDER — SEVELAMER CARBONATE 800 MG PO TABS
2400.0000 mg | ORAL_TABLET | Freq: Three times a day (TID) | ORAL | Status: DC
  Administered 2016-08-02 – 2016-08-05 (×6): 2400 mg via ORAL
  Filled 2016-08-02 (×7): qty 3

## 2016-08-02 MED ORDER — ACETAMINOPHEN 325 MG PO TABS: 650.0000 mg | ORAL_TABLET | Freq: Four times a day (QID) | ORAL | Status: DC | PRN

## 2016-08-02 NOTE — Progress Notes (Signed)
ANTICOAGULATION CONSULT NOTE - Follow Up Consult  Pharmacy Consult for Warfarin Indication: atrial fibrillation  Allergies  Allergen Reactions  . Ace Inhibitors Other (See Comments)    Worsening renal insufficiency    Patient Measurements: Height: 6\' 3"  (190.5 cm) Weight: 233 lb 4 oz (105.8 kg) IBW/kg (Calculated) : 84.5  Vital Signs: Temp: 98.4 F (36.9 C) (01/16 0957) Temp Source: Oral (01/16 0957) BP: 92/57 (01/16 0957) Pulse Rate: 51 (01/16 0957)  Labs:  Recent Labs  08/01/16 1027 08/01/16 1847 08/02/16 0414  HGB 11.6*  --  11.6*  HCT 37.3*  --  36.5*  PLT 223  --  212  LABPROT 23.9*  --  25.0*  INR 2.10  --  2.22  CREATININE 9.30* 9.85* 6.58*    Estimated Creatinine Clearance: 13.2 mL/min (by C-G formula based on SCr of 6.58 mg/dL (H)).   Assessment: 59 YOM with ESRD continued on warfarin from PTA for hx Afib. Admit INR 2.1. PTA dose noted to be 7.5 mg daily.   The patient originally stated that the last dose PTA was on 1/15 however later in the evening stated dose was needed. Warfarin dose for 1/15 was given early on 1/16 AM and INR not reflective of dose effect yet. INR remains therapeutic today (INR 2.22 << 2.1, goal of 2-3). CBC stable. No overt s/sx of bleeding noted. Will give a slightly lower dose this evening.   Goal of Therapy:  INR 2-3   Plan:  1. Warfarin 5 mg x 1 dose at 1800 today 2. Will continue to monitor for any signs/symptoms of bleeding and will follow up with PT/INR in the a.m.  Thank you for allowing pharmacy to be a part of this patient's care.  Alycia Rossetti, PharmD, BCPS Clinical Pharmacist Pager: [phone] Clinical phone for 08/02/2016 from 7a-3:30p: [phone] If after 3:30p, please call main pharmacy at: [phone] 08/02/2016 10:17 AM

## 2016-08-02 NOTE — Progress Notes (Signed)
PT Cancellation Note  Patient Details Name: Bruce Cole MRN: [id number] DOB: 1941-01-15   Cancelled Treatment:    Reason Eval/Treat Not Completed: Patient declined, no reason specified.  Pt states he does not need any therapy.  Will sign off as pt wishes. 08/02/2016  [name], [place] [phone]  (pager)   [name] 08/02/2016, 5:22 PM

## 2016-08-02 NOTE — Progress Notes (Signed)
PROGRESS NOTE        PATIENT DETAILS Name: Bruce Cole Age: 76 y.o. Sex: male Date of Birth: [birth year] Admit Date: 08/01/2016 Admitting Physician Toy Baker, MD PCP:O'SULLIVAN,[name] S., NP  Brief Narrative: Patient is a 76 y.o. male with PMHx of ESRD -on HD, Afib on coumadin-presented to the ED for evaluation of weakness/diarrhea-found to have severe hyperkalemia-requiring emergent HD. See below for further details.   Subjective: Lying comfortably in bed  Assessment/Plan: Recurrent Hyperkalemia:Resolved with HD. ?Etiology-do not see any offending meds. Check CK. Doubt related to any bowel issue-abd exam is completely benign.Per renal-?related to AVF-for fistulogram later today.  Follow [facility].   ESRD:Renal following-usual schedule is TTS  Diarrheal illness: resolved-probably viral synd  Chronic Systolic CHF (EF 51% to 88% by TTE 07/17):Euvolemic on exam-fluid removal with HD.   Afib:in junctional rhythm-INR therapeutic-coumadin per pharmacy  Hx of RA: stable-on Plaquenil.  Anemia: likely due to ESRD-per renal  DVT Prophylaxis: Full dose anticoagulation with Coumadin  Code Status: Full code   Family Communication: None at bedside  Disposition Plan: Remain inpatient-home likely 1/17  Antimicrobial agents: Anti-infectives    Start     Dose/Rate Route Frequency Ordered Stop   08/02/16 0129  hydroxychloroquine (PLAQUENIL) tablet 200 mg    Comments:  Reported on 01/13/2016     200 mg Oral 2 times daily 08/02/16 0129        Procedures: None  CONSULTS:  nephrology  Time spent: 25- minutes-Greater than 50% of this time was spent in counseling, explanation of diagnosis, planning of further management, and coordination of care.  MEDICATIONS: Scheduled Meds: . allopurinol  200 mg Oral Daily  . hydroxychloroquine  200 mg Oral BID  . sevelamer carbonate  2,400 mg Oral TID WC  . sodium chloride flush  3 mL Intravenous Q12H    . sodium chloride flush  3 mL Intravenous Q12H  . warfarin  5 mg Oral ONCE-1800  . Warfarin - Pharmacist Dosing Inpatient   Does not apply q1800   Continuous Infusions: PRN Meds:.sodium chloride, acetaminophen **OR** acetaminophen, albuterol, HYDROcodone-acetaminophen, ondansetron **OR** ondansetron (ZOFRAN) IV, sodium chloride flush   PHYSICAL EXAM: Vital signs: Vitals:   08/02/16 0105 08/02/16 0130 08/02/16 0705 08/02/16 0957  BP: (!) 104/56 (!) 96/46 (!) 94/46 (!) 92/57  Pulse: (!) 55 (!) 58 (!) 56 (!) 51  Resp: 18 18 18 18   Temp: 98.2 F (36.8 C) 97.3 F (36.3 C) 98.7 F (37.1 C) 98.4 F (36.9 C)  TempSrc: Oral Oral Oral Oral  SpO2: 95% 99% 98% 98%  Weight: 106 kg (233 lb 11 oz) 105.8 kg (233 lb 4 oz)    Height:       Filed Weights   08/01/16 2055 08/02/16 0105 08/02/16 0130  Weight: 108 kg (238 lb 1.6 oz) 106 kg (233 lb 11 oz) 105.8 kg (233 lb 4 oz)   Body mass index is 29.15 kg/m.   General appearance :Awake, alert, not in any distress. Speech Clear. Not toxic Looking Eyes:, pupils equally reactive to light and accomodation,no scleral icterus.Pink conjunctiva HEENT: Atraumatic and Normocephalic Neck: supple, no JVD. No cervical lymphadenopathy. No thyromegaly Resp:Good air entry bilaterally, no added sounds  CVS: S1 S2 regular, no murmurs.  GI: Bowel sounds present, Non tender and not distended with no gaurding, rigidity or rebound.No organomegaly Extremities: B/L Lower Ext shows no edema,  both legs are warm to touch Neurology:  speech clear,Non focal, sensation is grossly intact. Psychiatric: Normal judgment and insight. Alert and oriented x 3. Normal mood. Musculoskeletal:No digital cyanosis Skin:No Rash, warm and dry Wounds:N/A  I have personally reviewed following labs and imaging studies  LABORATORY DATA: CBC:  Recent Labs Lab 08/01/16 1027 08/02/16 0414  WBC 7.0 6.2  NEUTROABS 4.9  --   HGB 11.6* 11.6*  HCT 37.3* 36.5*  MCV 95.9 94.6  PLT 223  623    Basic Metabolic Panel:  Recent Labs Lab 08/01/16 1027 08/01/16 1847 08/01/16 2305 08/02/16 0414  NA 137 139  --  137  K 8.7* 7.1* 3.6 4.3  CL 102 100*  --  96*  CO2 24 26  --  29  GLUCOSE 117* 99  --  140*  BUN 57* 54*  --  27*  CREATININE 9.30* 9.85*  --  6.58*  CALCIUM 10.1 10.0  --  9.2  MG  --   --   --  2.2  PHOS  --   --   --  5.0*    GFR: Estimated Creatinine Clearance: 13.2 mL/min (by C-G formula based on SCr of 6.58 mg/dL (H)).  Liver Function Tests:  Recent Labs Lab 08/01/16 1027 08/02/16 0414  AST 41 37  ALT 18 18  ALKPHOS 152* 144*  BILITOT 0.4 0.5  PROT 8.6* 7.6  ALBUMIN 4.2 3.6   No results for input(s): LIPASE, AMYLASE in the last 168 hours. No results for input(s): AMMONIA in the last 168 hours.  Coagulation Profile:  Recent Labs Lab 07/27/16 0958 08/01/16 1027 08/02/16 0414  INR 1.4 2.10 2.22    Cardiac Enzymes: No results for input(s): CKTOTAL, CKMB, CKMBINDEX, TROPONINI in the last 168 hours.  BNP (last 3 results) No results for input(s): PROBNP in the last 8760 hours.  HbA1C: No results for input(s): HGBA1C in the last 72 hours.  CBG:  Recent Labs Lab 08/02/16 0128 08/02/16 0807  GLUCAP 97 91    Lipid Profile: No results for input(s): CHOL, HDL, LDLCALC, TRIG, CHOLHDL, LDLDIRECT in the last 72 hours.  Thyroid Function Tests:  Recent Labs  08/02/16 0414  TSH 1.412    Anemia Panel: No results for input(s): VITAMINB12, FOLATE, FERRITIN, TIBC, IRON, RETICCTPCT in the last 72 hours.  Urine analysis:    Component Value Date/Time   COLORURINE YELLOW 11/10/2015 1800   APPEARANCEUR CLEAR 11/10/2015 1800   LABSPEC 1.012 11/10/2015 1800   PHURINE 5.5 11/10/2015 1800   GLUCOSEU NEGATIVE 11/10/2015 1800   GLUCOSEU NEGATIVE 05/05/2015 1135   HGBUR NEGATIVE 11/10/2015 1800   HGBUR negative 09/21/2009 1008   BILIRUBINUR NEGATIVE 11/10/2015 1800   KETONESUR NEGATIVE 11/10/2015 1800   PROTEINUR NEGATIVE  11/10/2015 1800   UROBILINOGEN 0.2 05/05/2015 1135   NITRITE NEGATIVE 11/10/2015 1800   LEUKOCYTESUR NEGATIVE 11/10/2015 1800    Sepsis Labs: Lactic Acid, Venous    Component Value Date/Time   LATICACIDVEN 1.3 08/01/2016 2009    MICROBIOLOGY: No results found for this or any previous visit (from the past 240 hour(s)).  RADIOLOGY STUDIES/RESULTS: Dg Chest Port 1 View  Result Date: 07/16/2016 CLINICAL DATA:  Weakness, dyspnea, dialysis patient. EXAM: PORTABLE CHEST 1 VIEW COMPARISON:  None. FINDINGS: Cardiomegaly with external defibrillator paddle projecting over the left hemithorax. There is interstitial edema. No pneumothorax nor definite effusion. Aortic atherosclerosis. Right costophrenic angle is excluded on this study. IMPRESSION: Cardiomegaly with interstitial pulmonary edema. Electronically Signed   By: Ashley Royalty  M.D.   On: 07/16/2016 04:23     LOS: 1 day   Oren Binet, MD  [facility] Pager:[phone]  If 7PM-7AM, please contact night-coverage [email or web address] Password [email or web address] 08/02/2016, 10:19 AM

## 2016-08-02 NOTE — Discharge Summary (Signed)
[facility] Discharge Summary   Patient: Bruce Cole QQI:[id number]   PCP: Nance Pear., NP DOB: 02-07-1941   Date of admission: 07/16/2016   Date of discharge: 07/23/2016     Discharge Diagnoses:  Active Problems:   Essential hypertension   ATRIAL FIBRILLATION   DM (diabetes mellitus), type 2 with renal complications (HCC)   Long term current use of anticoagulant therapy   Chronic combined systolic and diastolic heart failure (HCC)   ESRD on dialysis ([place])   Acute hyperkalemia   Admitted From: home Disposition:  home  Recommendations for Outpatient Follow-up:  1.  Follow-up with PCP in one week. 2. Follow-up with cardiology in 1 month to get a monitor for bradycardia. 3. Follow-up with vascular surgery is recommended.  Follow-up Information    [facility] Follow up.   Specialty:  Vascular Surgery Why:  As needed Contact information: [address] [place] Whitman Jackson., NP. Schedule an appointment as soon as possible for a visit in 1 week(s).   Specialty:  Internal Medicine Contact information: [place] [address] [place] [address] [phone]        Glori Bickers, MD. Schedule an appointment as soon as possible for a visit in 1 month(s).   Specialty:  Cardiology Contact information: [address] [place] [address] [phone]        [facility] [employer]. Schedule an appointment as soon as possible for a visit on 07/25/2016.   Specialty:  Cardiology Why:  for INR check up.  Contact information: [address] [place] [phone]         Diet recommendation: Renal diet  Activity: The patient is advised to gradually reintroduce usual activities.  Discharge Condition: good  Code Status: Full code  History of present illness: As per the H and P  dictated on admission, "Mr. Funez is a 75M with PMH significant for a fib on chronic a/c with warfarin, chronic combined heart failure, ESRD on iHD TuThSa, GERD/gastritis, HTN, DMII, osteoarthritis with recent THA, presents to ED via EMS from home with complaints of loose stool, weakness, gait instability and shortness of breath. HR by EMS was int he low 50s. On arrival to the ED,  HR low 50s, adequate BP, O2 sats OK, afebrile. Initial labs showed K > 7.5, HCO3 16, Cr 9.9 BUN 67. CBC notable only for mild chronic anemia with Hgb 12.   He reports a normal dialysis session on Thursday. He has been having loose stools - copious watery, >10x per day - for the past several days. He thinks his grandson may have brought a stomach virus home from college. He denies abdominal pain. No bloody diarrhea. No associated fever / chills / nausea / vomiting. He has been trying to stay hydrated and has been eating more pineapple than usual. He endorses shortness of breath associated with mild cough with minimal phlegm. No hemoptysis or green/yellow sputum. No chest pain or palpitations. He just feels weak all over and has fallen a few times but without injury and never hit his head. No urinary symptoms. "  Hospital Course:  Pt. with PMH of A. fib on warfarin, CHF, ESRD, GERD, HTN, type II DM; admitted on 07/16/2016, with complaint of generalized weakness, diarrhea, shortness of breath, was found to have hyperkalemia as well as volume overload. Underwent urgent hemodialysis. Vascular surgery consulted  as there was a concern that the AV fistula was not working,Underwent fistulogram which shows decreased flow from the basilic vein  Summary of his active problems in the hospital is as following.  ESRD with acute hyperkalemia with bradycardia and ECG changes - Nephrology consulted, he received dialysis and his potassium has improved and is now within normal limits.  There are concernsfor AVF malfunction and Patient  tolerated revision of the AV fistula on 07/22/2016. Tolerated hemodialysis for the fistula.  Chronic combined systolic and diastolic heart failure  - Fluid management per nephrology, he appears euvolemic on exam  Chronic atrial fibrillation - CHADSVASC score at least 2, continue Coumadin, INR less than 2. Coumadin restart postprocedure today  Diarrhea - Resolved, C diff was negative  Chronic anemia  - stable, in the setting of end-stage renal disease  Diabetes  - SSI, CBGs 80-90s - Hemoglobin A1c 5.5  Bradycardia. Junctional rhythm on EKG, patient remained asymptomatic.  Follow-up with cardiology for :30 day monitor.  All other chronic medical condition were stable during the hospitalization.  Patient was ambulatory without any assistance. On the day of the discharge the patient's vitals were stable, and no other acute medical condition were reported by patient. the patient was felt safe to be discharge at home with family.  Procedures and Results:   Hemodialysis.  Fistulogram.  Repositioning of the AV fistula  Consultations:  Vascular surgery  Nephrology  DISCHARGE MEDICATION: Discharge Medication List as of 07/23/2016 12:16 PM    CONTINUE these medications which have CHANGED   Details  RENVELA 800 MG tablet Take 3 tablets (2,400 mg total) by mouth 3 (three) times daily with meals., Starting Sat 07/23/2016, Normal      CONTINUE these medications which have NOT CHANGED   Details  acetaminophen (TYLENOL) 500 MG tablet Take 500 mg by mouth every 6 (six) hours as needed for mild pain., Historical Med    albuterol (PROVENTIL) (2.5 MG/3ML) 0.083% nebulizer solution Take 2.5 mg by nebulization every 4 (four) hours as needed for shortness of breath. Reported on 09/25/2015, Until Discontinued, Historical Med    allopurinol (ZYLOPRIM) 100 MG tablet Take 2 tablets (200 mg total) by mouth daily., Starting Wed 11/25/2015, Normal    B Complex-C-Folic Acid (RENA-VITE RX) 1  MG TABS Take 1 tablet by mouth at bedtime. , Starting Thu 02/18/2016, Historical Med    fluticasone (FLONASE) 50 MCG/ACT nasal spray Place 2 sprays into both nostrils daily., Starting 03/18/2015, Until Discontinued, OTC    hydroxychloroquine (PLAQUENIL) 200 MG tablet Take 200 mg by mouth 2 (two) times daily. Reported on 01/13/2016, Starting 09/11/2014, Until Discontinued, Historical Med    warfarin (COUMADIN) 7.5 MG tablet Take 7.5 mg by mouth daily., Historical Med    furosemide (LASIX) 80 MG tablet Take 80mg s daily on  Mondays, Wednesdays, Fridays, and Sundays, Starting Tue 05/03/2016, Historical Med    gabapentin (NEURONTIN) 300 MG capsule Take 1 capsule (300 mg total) by mouth at bedtime., Starting Wed 06/08/2016, Normal    HYDROcodone-acetaminophen (NORCO) 5-325 MG tablet Take 1 tablet by mouth every 6 (six) hours as needed for moderate pain. One to two tabs every 4-6 hours for pain, Starting Mon 05/30/2016, Print    pantoprazole (PROTONIX) 40 MG tablet Take 1 tablet (40 mg total) by mouth 2 (two) times daily., Starting Wed 11/25/2015, Normal    polyethylene glycol (MIRALAX / GLYCOLAX) packet Take 17 g by mouth daily as needed for mild constipation. , Historical Med  Allergies  Allergen Reactions  . Ace Inhibitors Other (See Comments)    Worsening renal insufficiency   Discharge Instructions    Diet renal [id number]    Complete by:  As directed    Discharge instructions    Complete by:  As directed    It is important that you read following instructions as well as go over your medication list with RN to help you understand your care after this hospitalization.  Discharge Instructions: Please follow-up with PCP in one week  Please request your primary care physician to go over all Hospital Tests and Procedure/Radiological results at the follow up,  Please get all Hospital records sent to your PCP by signing hospital release before you go home.   Do not drive, operating  heavy machinery, perform activities at heights, swimming or participation in water activities or provide baby sitting services, until you have been seen by Primary Care Physician or a Neurologist and advised to do so again. Do not take more than prescribed Pain, Sleep and Anxiety Medications. You were cared for by a hospitalist during your hospital stay. If you have any questions about your discharge medications or the care you received while you were in the hospital after you are discharged, you can call the unit and ask to speak with the hospitalist on call if the hospitalist that took care of you is not available.  Once you are discharged, your primary care physician will handle any further medical issues. Please note that NO REFILLS for any discharge medications will be authorized once you are discharged, as it is imperative that you return to your primary care physician (or establish a relationship with a primary care physician if you do not have one) for your aftercare needs so that they can reassess your need for medications and monitor your lab values. You Must read complete instructions/literature along with all the possible adverse reactions/side effects for all the Medicines you take and that have been prescribed to you. Take any new Medicines after you have completely understood and accept all the possible adverse reactions/side effects. Wear Seat belts while driving. If you have smoked or chewed Tobacco in the last 2 yrs please stop smoking and/or stop any Recreational drug use.   Increase activity slowly    Complete by:  As directed      Discharge Exam: Filed Weights   07/23/16 0500 07/23/16 0715 07/23/16 1130  Weight: 110.9 kg (244 lb 8 oz) 110.7 kg (244 lb 0.8 oz) 108.4 kg (238 lb 15.7 oz)   Vitals:   07/23/16 1130 07/23/16 1217  BP: (!) 103/57 94/63  Pulse: (!) 46 (!) 52  Resp: (!) 21 (!) 21  Temp: 97 F (36.1 C)    General: Appear in no distress, no Rash; Oral Mucosa  moist. Cardiovascular: S1 and S2 Present, no Murmur, no JVD Respiratory: Bilateral Air entry present and Clear to Auscultation, no Crackles, no wheezes Abdomen: Bowel Sound present, Soft and no tenderness Extremities: no Pedal edema, no calf tenderness Neurology: Grossly no focal neuro deficit.  The results of significant diagnostics from this hospitalization (including imaging, microbiology, ancillary and laboratory) are listed below for reference.    Significant Diagnostic Studies: Dg Chest Port 1 View  Result Date: 07/16/2016 CLINICAL DATA:  Weakness, dyspnea, dialysis patient. EXAM: PORTABLE CHEST 1 VIEW COMPARISON:  None. FINDINGS: Cardiomegaly with external defibrillator paddle projecting over the left hemithorax. There is interstitial edema. No pneumothorax nor definite effusion. Aortic atherosclerosis. Right costophrenic angle is excluded  on this study. IMPRESSION: Cardiomegaly with interstitial pulmonary edema. Electronically Signed   By: Ashley Royalty M.D.   On: 07/16/2016 04:23   Time spent: 30 minutes  Signed:  Berle Mull  [facility] 07/23/2016   , 5:24 PM

## 2016-08-02 NOTE — Progress Notes (Signed)
   Subjective:    Patient ID: Bruce Cole, male    DOB: 08-17-1940, 76 y.o.   MRN: [id number]  HPI Pt never seen and he walked out before being seen. Minimal wait time of 20 minutes. He went to ED. Then was admitted.   Review of Systems     Objective:   Physical Exam        Assessment & Plan:

## 2016-08-02 NOTE — Progress Notes (Signed)
Contacted IR, on-call MD stated that this patient was not on the schedule this evening for his fistulogram, since it was not an emergency.  MD would make sure day-time IR staff knows about this request.  Informed attending MD.  Order received for renal diet & after midnight.  Entered patient's room & informed patient about receiving diet order for this evening & that he would be NPO after midnight.  Patient expressed understanding of this.  Patient expressed frustration over not having had anything to eat and not knowing what was going.  Educated patient regarding current plan of care.  Patient seemed to be in agreement with plan and satisfied with information provided.  Jillyn Ledger, MBA, BSN, RN

## 2016-08-02 NOTE — Progress Notes (Signed)
Subjective:  No cos .frustrated with recurrent Hyperkalemia , tolerated HD  yest pm, tolerated  Brk.no gi cos , / spoke with IR trying to fit into schedule today.   Objective Vital signs in last 24 hours: Vitals:   08/02/16 0100 08/02/16 0105 08/02/16 0130 08/02/16 0705  BP: 102/62 (!) 104/56 (!) 96/46 (!) 94/46  Pulse: (!) 59 (!) 55 (!) 58 (!) 56  Resp: 16 18 18 18   Temp:  98.2 F (36.8 C) 97.3 F (36.3 C) 98.7 F (37.1 C)  TempSrc:  Oral Oral Oral  SpO2:  95% 99% 98%  Weight:  106 kg (233 lb 11 oz) 105.8 kg (233 lb 4 oz)   Height:       Weight change:   Physical Exam: General: alert , WD  AM, NAD OX4 Heart: rare irreg  Beat, vent rate 50s' Lungs: cta  Abdomen: bs pos, soft NT, ND Extremities: no pedal edema  Dialysis Access: LUA AVF pos bruit    OP Dialysis Orders: [facility] TTS  edw = 108.5 kg   400/800 2K/ 2.25 Ca UF Profile 2, Linear Na Heparin 6000 units IV q tx Hectoral 2 mcg IV q tx Mircera   Problem/Plan: 1. Recurrent Hyperkalemia= resolved with HD am k= 4.3  / have called  IR to check Fistlogram latter today  2. ESRD - Normal  schedule tts  [facility]  Next hd tomor  Wed /   3. Anemia - hgb 11.6 no esa needed 4. Secondary hyperparathyroidism - phos 5 / ca 3.6/ vit d on hd  5. HTN/volume - bp stable to lowish  and below edw / no uf with next hd  6.  7. DM type 2  Per admit  8. DJD  Sp R hip arthroplasty = uses walker   Ernest Haber, PA-C [place] [phone] 08/02/2016,9:07 AM  LOS: 1 day   Labs: Basic Metabolic Panel:  Recent Labs Lab 08/01/16 1027 08/01/16 1847 08/01/16 2305 08/02/16 0414  NA 137 139  --  137  K 8.7* 7.1* 3.6 4.3  CL 102 100*  --  96*  CO2 24 26  --  29  GLUCOSE 117* 99  --  140*  BUN 57* 54*  --  27*  CREATININE 9.30* 9.85*  --  6.58*  CALCIUM 10.1 10.0  --  9.2  PHOS  --   --   --  5.0*   Liver Function Tests:  Recent Labs Lab 08/01/16 1027 08/02/16 0414  AST 41 37  ALT 18 18   ALKPHOS 152* 144*  BILITOT 0.4 0.5  PROT 8.6* 7.6  ALBUMIN 4.2 3.6   No results for input(s): LIPASE, AMYLASE in the last 168 hours. No results for input(s): AMMONIA in the last 168 hours. CBC:  Recent Labs Lab 08/01/16 1027 08/02/16 0414  WBC 7.0 6.2  NEUTROABS 4.9  --   HGB 11.6* 11.6*  HCT 37.3* 36.5*  MCV 95.9 94.6  PLT 223 212   Cardiac Enzymes: No results for input(s): CKTOTAL, CKMB, CKMBINDEX, TROPONINI in the last 168 hours. CBG:  Recent Labs Lab 08/02/16 0128 08/02/16 0807  GLUCAP 97 91    Studies/Results: No results found. Medications:  . allopurinol  200 mg Oral Daily  . hydroxychloroquine  200 mg Oral BID  . insulin aspart  0-5 Units Subcutaneous QHS  . insulin aspart  0-9 Units Subcutaneous TID WC  . sevelamer carbonate  2,400 mg Oral TID WC  . sodium  bicarbonate  50 mEq Intravenous Once  . sodium chloride flush  3 mL Intravenous Q12H  . sodium chloride flush  3 mL Intravenous Q12H  . Warfarin - Pharmacist Dosing Inpatient   Does not apply [phone]

## 2016-08-02 NOTE — Progress Notes (Addendum)
Patient has been increasingly irritated during shift. Patient was made NPO this morning for fistulogram, however IR was contacted and fistulogram to be done in the morning. Doctor was contacted to discuss plan of care with the patient. Patient was still upset, raising his voice and could be heard from nurses station. Charge nurse made aware.

## 2016-08-02 NOTE — Progress Notes (Addendum)
Called to patients room, patient was very upset that an echo has been scheduled without MD discussing this with him. Echo tech offered to come back later, patient felt he was being charged for unnecessary test. Nurse tech also reported patient was irritated that he had to wait approximately 30 min for linen/wash up today.

## 2016-08-02 NOTE — Progress Notes (Signed)
Unit secretary informed this charge RN that she went to the patient's room to give him a drink.  Secretary stated that the patient was rasing his voice to her, asking why it takes so long to get a drink.  The secretary stated that I am just bringing you a drink.  Patient stated, "you can shove that drink up your butt!"  Jillyn Ledger, MBA, BSN, RN

## 2016-08-02 NOTE — Progress Notes (Signed)
Entered room after patient being irate and raising his voice to other staff members.  Patient still raising his voice as I entered room.  Patient stated that he was mad that it is taking so long to get something to eat and drink.  This RN apologized for the delay in bringing his drink to him.  Reiterated reason for being NPO and having to call MD for diet order.  Patient still expressing frustration about whole stay.  Requested that patient not raise his voice to our staff members.  Patient stated that he wouldn't call for any help then.  Reiterated to patient that we want him to call for help and our goal is for him to have a positive stay.  Jillyn Ledger, MBA, BSN, RN

## 2016-08-03 ENCOUNTER — Inpatient Hospital Stay (HOSPITAL_COMMUNITY): Payer: Medicare Other

## 2016-08-03 DIAGNOSIS — I10 Essential (primary) hypertension: Secondary | ICD-10-CM

## 2016-08-03 LAB — RENAL FUNCTION PANEL
ALBUMIN: 3.7 g/dL (ref 3.5–5.0)
Anion gap: 17 — ABNORMAL HIGH (ref 5–15)
BUN: 39 mg/dL — AB (ref 6–20)
CALCIUM: 9.5 mg/dL (ref 8.9–10.3)
CO2: 24 mmol/L (ref 22–32)
CREATININE: 9.18 mg/dL — AB (ref 0.61–1.24)
Chloride: 96 mmol/L — ABNORMAL LOW (ref 101–111)
GFR calc Af Amer: 6 mL/min — ABNORMAL LOW (ref >60)
GFR, EST NON AFRICAN AMERICAN: 5 mL/min — AB (ref >60)
GLUCOSE: 91 mg/dL (ref 65–99)
PHOSPHORUS: 7.1 mg/dL — AB (ref 2.5–4.6)
Potassium: 5.4 mmol/L — ABNORMAL HIGH (ref 3.5–5.1)
SODIUM: 137 mmol/L (ref 135–145)

## 2016-08-03 LAB — PROTIME-INR
INR: 2.63
PROTHROMBIN TIME: 28.6 s — AB (ref 11.4–15.2)

## 2016-08-03 LAB — HEMOGLOBIN A1C
Hgb A1c MFr Bld: 6.1 % — ABNORMAL HIGH (ref 4.8–5.6)
MEAN PLASMA GLUCOSE: 128 mg/dL

## 2016-08-03 MED ORDER — WARFARIN SODIUM 7.5 MG PO TABS
7.5000 mg | ORAL_TABLET | Freq: Once | ORAL | Status: AC
  Administered 2016-08-03: 7.5 mg via ORAL
  Filled 2016-08-03 (×2): qty 1

## 2016-08-03 MED ORDER — IOPAMIDOL (ISOVUE-300) INJECTION 61%
INTRAVENOUS | Status: AC
  Filled 2016-08-03: qty 100

## 2016-08-03 MED ORDER — ZOLPIDEM TARTRATE 5 MG PO TABS
5.0000 mg | ORAL_TABLET | Freq: Once | ORAL | Status: AC
  Administered 2016-08-04: 5 mg via ORAL
  Filled 2016-08-03: qty 1

## 2016-08-03 NOTE — Procedures (Signed)
I have seen and examined this patient and agree with the plan of care  BP 98/49    Ca 9.5  Phos 7.1  Alb 3.7  Hb 11.6  K 5.4          Halynn Reitano W 08/03/2016, 9:06 AM

## 2016-08-03 NOTE — Progress Notes (Signed)
Patient here in Interventional Radiology for fistulagram of hemodialysis access.  Bruce Cole has decided that tomorrow Jan 17th would be a better day to do fistulagram.  Dr Kathlene Cote spoke with Bruce Cole  And pt was sent back to his room.

## 2016-08-03 NOTE — Progress Notes (Signed)
Spoke with patient about fistulogram, stated he refused to go because of too many needle sticks to fistula today and was irritated with length of dialysis treatment today. Also stated laying on the table in IR was painful due to past spinal surgery/ Requested pain medication be given before procedure tomorrow. Talked to IR, patient does not need to be NPO unless they need to do an intervention. Fistulogram is  scheduled for around noon, discussed options with patient who feels it would be best to remain NPO from 6am on. Patient seems satisfied with plan of care.

## 2016-08-03 NOTE — Progress Notes (Signed)
PROGRESS NOTE        PATIENT DETAILS Name: Bruce Cole Age: 76 y.o. Sex: male Date of Birth: March 25, 1941 Admit Date: 08/01/2016 Admitting Physician Toy Baker, MD PCP:O'SULLIVAN,[name] S., NP  Brief Narrative: Patient is a 76 y.o. male with PMHx of ESRD -on HD, Afib on coumadin-presented to the ED for evaluation of weakness/diarrhea-found to have severe hyperkalemia-requiring emergent HD. See below for further details.   Subjective: Hungry-seen earlier this morning at hemodialysis unit. Denies any chest pain or shortness of breath  Assessment/Plan: Recurrent Hyperkalemia:Resolved with HD. ?Etiology-do not see any offending meds, CK within normal limits. Per nephrology, etiology could be secondary to dialysis recirculation. Vision appears to be very compliant with diet restrictions. Continue to follow electrolytes, plans are for fistulogram later today.   ESRD:Renal following-usual schedule is TTS  Diarrheal illness: resolved-probably viral synd. Stool GI pathogen panel, C. difficile PCR negative.  Chronic Systolic CHF (EF 37% to 90% by TTE 07/17):Euvolemic on exam-fluid removal with HD.   Afib:in junctional rhythm-INR therapeutic-coumadin per pharmacy  Hx of RA: stable-on Plaquenil.  Anemia: likely due to ESRD-per renal  DVT Prophylaxis: Full dose anticoagulation with Coumadin  Code Status: Full code   Family Communication: None at bedside  Disposition Plan: Remain inpatient-home likely 1/18  Antimicrobial agents: Anti-infectives    Start     Dose/Rate Route Frequency Ordered Stop   08/02/16 0129  hydroxychloroquine (PLAQUENIL) tablet 200 mg    Comments:  Reported on 01/13/2016     200 mg Oral 2 times daily 08/02/16 0129        Procedures: None  CONSULTS:  nephrology  Time spent: 25- minutes-Greater than 50% of this time was spent in counseling, explanation of diagnosis, planning of further management, and coordination  of care.  MEDICATIONS: Scheduled Meds: . allopurinol  200 mg Oral Daily  . hydroxychloroquine  200 mg Oral BID  . sevelamer carbonate  2,400 mg Oral TID WC  . sodium chloride flush  3 mL Intravenous Q12H  . sodium chloride flush  3 mL Intravenous Q12H  . Warfarin - Pharmacist Dosing Inpatient   Does not apply q1800   Continuous Infusions: PRN Meds:.sodium chloride, acetaminophen **OR** acetaminophen, albuterol, HYDROcodone-acetaminophen, ondansetron **OR** ondansetron (ZOFRAN) IV, sodium chloride flush   PHYSICAL EXAM: Vital signs: Vitals:   08/03/16 0900 08/03/16 0930 08/03/16 1000 08/03/16 1030  BP: (!) 92/58 (!) 97/56 (!) 97/51 (!) 93/50  Pulse: (!) 46 (!) 46 (!) 49 (!) 46  Resp:      Temp:      TempSrc:      SpO2:      Weight:      Height:       Filed Weights   08/02/16 0130 08/03/16 0520 08/03/16 0830  Weight: 105.8 kg (233 lb 4 oz) 106.1 kg (233 lb 14.4 oz) 105.9 kg (233 lb 7.5 oz)   Body mass index is 29.18 kg/m.   General appearance :Awake, alert, not in any distress. Speech Clear. Not toxic Looking Eyes:, pupils equally reactive to light and accomodation,no scleral icterus.Pink conjunctiva HEENT: Atraumatic and Normocephalic Neck: supple, no JVD. No cervical lymphadenopathy. No thyromegaly Resp:Good air entry bilaterally, no added sounds  CVS: S1 S2 regular, no murmurs.  GI: Bowel sounds present, Non tender and not distended with no gaurding, rigidity or rebound.No organomegaly Extremities: B/L Lower Ext shows no edema, both  legs are warm to touch Neurology:  speech clear,Non focal, sensation is grossly intact. Psychiatric: Normal judgment and insight. Alert and oriented x 3. Normal mood. Musculoskeletal:No digital cyanosis Skin:No Rash, warm and dry Wounds:N/A  I have personally reviewed following labs and imaging studies  LABORATORY DATA: CBC:  Recent Labs Lab 08/01/16 1027 08/02/16 0414  WBC 7.0 6.2  NEUTROABS 4.9  --   HGB 11.6* 11.6*  HCT  37.3* 36.5*  MCV 95.9 94.6  PLT 223 921    Basic Metabolic Panel:  Recent Labs Lab 08/01/16 1027 08/01/16 1847 08/01/16 2305 08/02/16 0414 08/03/16 0609  NA 137 139  --  137 137  K 8.7* 7.1* 3.6 4.3 5.4*  CL 102 100*  --  96* 96*  CO2 24 26  --  29 24  GLUCOSE 117* 99  --  140* 91  BUN 57* 54*  --  27* 39*  CREATININE 9.30* 9.85*  --  6.58* 9.18*  CALCIUM 10.1 10.0  --  9.2 9.5  MG  --   --   --  2.2  --   PHOS  --   --   --  5.0* 7.1*    GFR: Estimated Creatinine Clearance: 9.4 mL/min (by C-G formula based on SCr of 9.18 mg/dL (H)).  Liver Function Tests:  Recent Labs Lab 08/01/16 1027 08/02/16 0414 08/03/16 0609  AST 41 37  --   ALT 18 18  --   ALKPHOS 152* 144*  --   BILITOT 0.4 0.5  --   PROT 8.6* 7.6  --   ALBUMIN 4.2 3.6 3.7   No results for input(s): LIPASE, AMYLASE in the last 168 hours. No results for input(s): AMMONIA in the last 168 hours.  Coagulation Profile:  Recent Labs Lab 08/01/16 1027 08/02/16 0414 08/03/16 0609  INR 2.10 2.22 2.63    Cardiac Enzymes:  Recent Labs Lab 08/02/16 1536  CKTOTAL 90    BNP (last 3 results) No results for input(s): PROBNP in the last 8760 hours.  HbA1C:  Recent Labs  08/02/16 0414  HGBA1C 6.1*    CBG:  Recent Labs Lab 08/02/16 0128 08/02/16 0807 08/02/16 1135  GLUCAP 97 91 121*    Lipid Profile: No results for input(s): CHOL, HDL, LDLCALC, TRIG, CHOLHDL, LDLDIRECT in the last 72 hours.  Thyroid Function Tests:  Recent Labs  08/02/16 0414  TSH 1.412    Anemia Panel: No results for input(s): VITAMINB12, FOLATE, FERRITIN, TIBC, IRON, RETICCTPCT in the last 72 hours.  Urine analysis:    Component Value Date/Time   COLORURINE YELLOW 11/10/2015 1800   APPEARANCEUR CLEAR 11/10/2015 1800   LABSPEC 1.012 11/10/2015 1800   PHURINE 5.5 11/10/2015 1800   GLUCOSEU NEGATIVE 11/10/2015 1800   GLUCOSEU NEGATIVE 05/05/2015 1135   HGBUR NEGATIVE 11/10/2015 1800   HGBUR negative  09/21/2009 1008   BILIRUBINUR NEGATIVE 11/10/2015 1800   KETONESUR NEGATIVE 11/10/2015 1800   PROTEINUR NEGATIVE 11/10/2015 1800   UROBILINOGEN 0.2 05/05/2015 1135   NITRITE NEGATIVE 11/10/2015 1800   LEUKOCYTESUR NEGATIVE 11/10/2015 1800    Sepsis Labs: Lactic Acid, Venous    Component Value Date/Time   LATICACIDVEN 1.3 08/01/2016 2009    MICROBIOLOGY: Recent Results (from the past 240 hour(s))  C difficile quick scan w PCR reflex     Status: None   Collection Time: 08/02/16  9:05 AM  Result Value Ref Range Status   C Diff antigen NEGATIVE NEGATIVE Final   C Diff toxin NEGATIVE NEGATIVE Final  C Diff interpretation No C. difficile detected.  Final  Gastrointestinal Panel by PCR , Stool     Status: None   Collection Time: 08/02/16  9:05 AM  Result Value Ref Range Status   Campylobacter species NOT DETECTED NOT DETECTED Final   Plesimonas shigelloides NOT DETECTED NOT DETECTED Final   Salmonella species NOT DETECTED NOT DETECTED Final   Yersinia enterocolitica NOT DETECTED NOT DETECTED Final   Vibrio species NOT DETECTED NOT DETECTED Final   Vibrio cholerae NOT DETECTED NOT DETECTED Final   Enteroaggregative E coli (EAEC) NOT DETECTED NOT DETECTED Final   Enteropathogenic E coli (EPEC) NOT DETECTED NOT DETECTED Final   Enterotoxigenic E coli (ETEC) NOT DETECTED NOT DETECTED Final   Shiga like toxin producing E coli (STEC) NOT DETECTED NOT DETECTED Final   Shigella/Enteroinvasive E coli (EIEC) NOT DETECTED NOT DETECTED Final   Cryptosporidium NOT DETECTED NOT DETECTED Final   Cyclospora cayetanensis NOT DETECTED NOT DETECTED Final   Entamoeba histolytica NOT DETECTED NOT DETECTED Final   Giardia lamblia NOT DETECTED NOT DETECTED Final   Adenovirus F40/41 NOT DETECTED NOT DETECTED Final   Astrovirus NOT DETECTED NOT DETECTED Final   Norovirus GI/GII NOT DETECTED NOT DETECTED Final   Rotavirus A NOT DETECTED NOT DETECTED Final   Sapovirus (I, II, IV, and V) NOT DETECTED NOT  DETECTED Final    RADIOLOGY STUDIES/RESULTS: Dg Chest Port 1 View  Result Date: 07/16/2016 CLINICAL DATA:  Weakness, dyspnea, dialysis patient. EXAM: PORTABLE CHEST 1 VIEW COMPARISON:  None. FINDINGS: Cardiomegaly with external defibrillator paddle projecting over the left hemithorax. There is interstitial edema. No pneumothorax nor definite effusion. Aortic atherosclerosis. Right costophrenic angle is excluded on this study. IMPRESSION: Cardiomegaly with interstitial pulmonary edema. Electronically Signed   By: Ashley Royalty M.D.   On: 07/16/2016 04:23     LOS: 2 days   Oren Binet, MD  [facility] Pager:[phone]  If 7PM-7AM, please contact night-coverage [email or web address] Password [email or web address] 08/03/2016, 11:06 AM

## 2016-08-03 NOTE — Progress Notes (Signed)
ANTICOAGULATION CONSULT NOTE - Follow Up Consult  Pharmacy Consult for Warfarin Indication: atrial fibrillation  Allergies  Allergen Reactions  . Ace Inhibitors Other (See Comments)    Worsening renal insufficiency    Patient Measurements: Height: 6\' 3"  (190.5 cm) Weight: 233 lb 7.5 oz (105.9 kg) IBW/kg (Calculated) : 84.5  Vital Signs: Temp: 97.9 F (36.6 C) (01/17 0830) Temp Source: Oral (01/17 0830) BP: 99/49 (01/17 1100) Pulse Rate: 47 (01/17 1100)  Labs:  Recent Labs  08/01/16 1027 08/01/16 1847 08/02/16 0414 08/02/16 1536 08/03/16 0609  HGB 11.6*  --  11.6*  --   --   HCT 37.3*  --  36.5*  --   --   PLT 223  --  212  --   --   LABPROT 23.9*  --  25.0*  --  28.6*  INR 2.10  --  2.22  --  2.63  CREATININE 9.30* 9.85* 6.58*  --  9.18*  CKTOTAL  --   --   --  90  --     Estimated Creatinine Clearance: 9.4 mL/min (by C-G formula based on SCr of 9.18 mg/dL (H)).   Assessment: 11 YOM with ESRD continued on warfarin from PTA for hx Afib. Admit INR 2.1. PTA dose noted to be 7.5 mg daily.   INR today remains therapeutic today (INR 2.63 << 2.22, goal of 2-3). No CBC today, ws stable from 1/16 labs. No overt s/sx of bleeding noted. Will continue with the patient's home dose   Goal of Therapy:  INR 2-3   Plan:  1. Warfarin 7.5 mg x 1 dose at 1800 today 2. Will continue to monitor for any signs/symptoms of bleeding and will follow up with PT/INR in the a.m.  Thank you for allowing pharmacy to be a part of this patient's care.  Alycia Rossetti, PharmD, BCPS Clinical Pharmacist Pager: [phone] Clinical phone for 08/03/2016 from 7a-3:30p: [phone] If after 3:30p, please call main pharmacy at: [phone] 08/03/2016 11:30 AM

## 2016-08-04 ENCOUNTER — Telehealth: Payer: Self-pay | Admitting: Family

## 2016-08-04 ENCOUNTER — Inpatient Hospital Stay (HOSPITAL_COMMUNITY): Payer: Medicare Other

## 2016-08-04 ENCOUNTER — Encounter (HOSPITAL_COMMUNITY): Payer: Self-pay | Admitting: Diagnostic Radiology

## 2016-08-04 DIAGNOSIS — E1122 Type 2 diabetes mellitus with diabetic chronic kidney disease: Secondary | ICD-10-CM

## 2016-08-04 HISTORY — PX: IR GENERIC HISTORICAL: IMG1180011

## 2016-08-04 LAB — RENAL FUNCTION PANEL
ANION GAP: 11 (ref 5–15)
Albumin: 3.5 g/dL (ref 3.5–5.0)
BUN: 24 mg/dL — AB (ref 6–20)
CALCIUM: 9.3 mg/dL (ref 8.9–10.3)
CO2: 29 mmol/L (ref 22–32)
Chloride: 96 mmol/L — ABNORMAL LOW (ref 101–111)
Creatinine, Ser: 6.63 mg/dL — ABNORMAL HIGH (ref 0.61–1.24)
GFR calc Af Amer: 9 mL/min — ABNORMAL LOW (ref >60)
GFR calc non Af Amer: 7 mL/min — ABNORMAL LOW (ref >60)
GLUCOSE: 96 mg/dL (ref 65–99)
Phosphorus: 5.4 mg/dL — ABNORMAL HIGH (ref 2.5–4.6)
Potassium: 4.9 mmol/L (ref 3.5–5.1)
SODIUM: 136 mmol/L (ref 135–145)

## 2016-08-04 LAB — PROTIME-INR
INR: 2.52
Prothrombin Time: 27.7 seconds — ABNORMAL HIGH (ref 11.4–15.2)

## 2016-08-04 MED ORDER — MORPHINE SULFATE (PF) 4 MG/ML IV SOLN
INTRAVENOUS | Status: AC
  Filled 2016-08-04: qty 1

## 2016-08-04 MED ORDER — MORPHINE SULFATE (PF) 2 MG/ML IV SOLN
1.0000 mg | Freq: Once | INTRAVENOUS | Status: AC | PRN
  Administered 2016-08-04: 2 mg via INTRAVENOUS

## 2016-08-04 MED ORDER — WARFARIN SODIUM 7.5 MG PO TABS
7.5000 mg | ORAL_TABLET | Freq: Once | ORAL | Status: AC
  Administered 2016-08-04: 7.5 mg via ORAL
  Filled 2016-08-04: qty 1

## 2016-08-04 NOTE — Telephone Encounter (Signed)
Please contact pt to arrange hospital follow up. 

## 2016-08-04 NOTE — Progress Notes (Signed)
Subjective:  Tolerated HD yest. ( off TTS   HD schedule sec to Hyperkalemia  On Admit) for Fistulagram today  Objective Vital signs in last 24 hours: Vitals:   08/03/16 2035 08/03/16 2038 08/04/16 0500 08/04/16 1024  BP:  (!) 98/57 (!) 107/59 100/63  Pulse:  (!) 54 (!) 53 (!) 54  Resp:  16 16 16   Temp:  98.4 F (36.9 C) 97.6 F (36.4 C) 98.4 F (36.9 C)  TempSrc:  Oral Oral Oral  SpO2:  97% 99% 99%  Weight: 106.7 kg (235 lb 3.2 oz)     Height: 6\' 3"  (1.905 m)      Weight change: -0.196 kg (-6.9 oz)  Physical Exam: General: alert , WD  AM, NAD OX4 Heart:  Brady in 50s, regular, no mur or rub    Lungs: cta  Abdomen: bs pos, soft NT, ND Extremities: no pedal edema  Dialysis Access: LUA AVF pos bruit    OP Dialysis Orders: [facility] TTS  edw = 108.5 kg   400/800 2K/ 2.25 Ca UF Profile 2, Linear Na Heparin 6000 units IV q tx Hectoral 2 mcg IV q tx Mircera   Problem/Plan: 1. Recurrent Hyperkalemia= resolved with HD/ am k= 4.9  /IR Fistulogram today to eval ?recirculating  At OP unit and occas trouble with Cannulation  2. ESRD - Normal  schedule tts  [facility]   DC  Today if stable after fistulogram  3. Anemia - hgb 11.6 no esa needed 4. Secondary hyperparathyroidism - phos 5 / ca 3.6/ vit d on hd  5. HTN/volume - bp stable to lowish  asymp/ and below edw 106.7  yest ( 108.5 edw) /  6. HO A. Fib - on coumadin per admit/phar.  7. DM type 2  Per admit  8. DJD  Sp R hip arthroplasty = uses walker  Ernest Haber, PA-C [facility] Beeper [phone] 08/04/2016,10:26 AM  LOS: 3 days   Labs: Basic Metabolic Panel:  Recent Labs Lab 08/02/16 0414 08/03/16 0609 08/04/16 0636  NA 137 137 136  K 4.3 5.4* 4.9  CL 96* 96* 96*  CO2 29 24 29   GLUCOSE 140* 91 96  BUN 27* 39* 24*  CREATININE 6.58* 9.18* 6.63*  CALCIUM 9.2 9.5 9.3  PHOS 5.0* 7.1* 5.4*   Liver Function Tests:  Recent Labs Lab 08/01/16 1027 08/02/16 0414 08/03/16 0609 08/04/16 0636   AST 41 37  --   --   ALT 18 18  --   --   ALKPHOS 152* 144*  --   --   BILITOT 0.4 0.5  --   --   PROT 8.6* 7.6  --   --   ALBUMIN 4.2 3.6 3.7 3.5   No results for input(s): LIPASE, AMYLASE in the last 168 hours. No results for input(s): AMMONIA in the last 168 hours. CBC:  Recent Labs Lab 08/01/16 1027 08/02/16 0414  WBC 7.0 6.2  NEUTROABS 4.9  --   HGB 11.6* 11.6*  HCT 37.3* 36.5*  MCV 95.9 94.6  PLT 223 212   Cardiac Enzymes:  Recent Labs Lab 08/02/16 1536  CKTOTAL 90   CBG:  Recent Labs Lab 08/02/16 0128 08/02/16 0807 08/02/16 1135  GLUCAP 97 91 121*    Studies/Results: No results found. Medications:  . allopurinol  200 mg Oral Daily  . hydroxychloroquine  200 mg Oral BID  . morphine      . sevelamer carbonate  2,400 mg Oral TID WC  .  sodium chloride flush  3 mL Intravenous Q12H  . sodium chloride flush  3 mL Intravenous Q12H  . warfarin  7.5 mg Oral ONCE-1800  . Warfarin - Pharmacist Dosing Inpatient   Does not apply [phone]

## 2016-08-04 NOTE — Progress Notes (Signed)
ANTICOAGULATION CONSULT NOTE - Follow Up Consult  Pharmacy Consult for Warfarin Indication: atrial fibrillation  Allergies  Allergen Reactions  . Ace Inhibitors Other (See Comments)    Worsening renal insufficiency    Patient Measurements: Height: 6\' 3"  (190.5 cm) Weight: 235 lb 3.2 oz (106.7 kg) IBW/kg (Calculated) : 84.5  Vital Signs: Temp: 97.6 F (36.4 C) (01/18 0500) Temp Source: Oral (01/18 0500) BP: 107/59 (01/18 0500) Pulse Rate: 53 (01/18 0500)  Labs:  Recent Labs  08/01/16 1027  08/02/16 0414 08/02/16 1536 08/03/16 0609 08/04/16 0636  HGB 11.6*  --  11.6*  --   --   --   HCT 37.3*  --  36.5*  --   --   --   PLT 223  --  212  --   --   --   LABPROT 23.9*  --  25.0*  --  28.6* 27.7*  INR 2.10  --  2.22  --  2.63 2.52  CREATININE 9.30*  < > 6.58*  --  9.18* 6.63*  CKTOTAL  --   --   --  90  --   --   < > = values in this interval not displayed.  Estimated Creatinine Clearance: 13.1 mL/min (by C-G formula based on SCr of 6.63 mg/dL (H)).   Assessment: 31 YOM with ESRD continued on warfarin from PTA for hx Afib. Admit INR 2.1. PTA dose noted to be 7.5 mg daily.   INR today remains therapeutic today (INR 2.52 << 2.63, goal of 2-3). No CBC today, ws stable from 1/16 labs. No overt s/sx of bleeding noted. Will continue with the patient's home dose.  Goal of Therapy:  INR 2-3   Plan:  1. Warfarin 7.5 mg x 1 dose at 1800 today 2. Will continue to monitor for any signs/symptoms of bleeding and will follow up with PT/INR in the a.m.  Thank you for allowing pharmacy to be a part of this patient's care.  Alycia Rossetti, PharmD, BCPS Clinical Pharmacist Pager: [phone] Clinical phone for 08/04/2016 from 7a-3:30p: [phone] If after 3:30p, please call main pharmacy at: [phone] 08/04/2016 8:54 AM

## 2016-08-04 NOTE — Discharge Instructions (Signed)

## 2016-08-04 NOTE — Progress Notes (Signed)
PROGRESS NOTE        PATIENT DETAILS Name: Bruce Cole Age: 76 y.o. Sex: male Date of Birth: 03-20-1941 Admit Date: 08/01/2016 Admitting Physician Toy Baker, MD PCP:O'SULLIVAN,[name] S., NP  Brief Narrative: Patient is a 76 y.o. male with PMHx of ESRD -on HD, Afib on coumadin-presented to the ED for evaluation of weakness/diarrhea-found to have severe hyperkalemia-requiring emergent HD. See below for further details.   Subjective: Add-underwent fistulogram earlier this morning. Potassium is within normal limits-urology wants to monitor one additional day before discharge patient home.  Assessment/Plan: Recurrent Hyperkalemia:Resolved with HD. ?Etiology-do not see any offending meds, CK within normal limits. Per nephrology, etiology could be secondary to dialysis recirculation. Patient appears to be very compliant with diet restrictions. Continue to follow electrolytes, plans are to monitor one additional day, and discharge home after dialysis on 1/19.   ESRD:Renal following-usual schedule is TTS  Diarrheal illness: resolved-probably viral synd. Stool GI pathogen panel, C. difficile PCR negative.  Chronic Systolic CHF (EF 94% to 85% by TTE 07/17):Euvolemic on exam-fluid removal with HD.   Afib:in junctional rhythm-INR therapeutic-coumadin per pharmacy  Hx of RA: stable-on Plaquenil.  Anemia: likely due to ESRD-per renal  DVT Prophylaxis: Full dose anticoagulation with Coumadin  Code Status: Full code   Family Communication: None at bedside  Disposition Plan: Remain inpatient-home likely 1/19  Antimicrobial agents: Anti-infectives    Start     Dose/Rate Route Frequency Ordered Stop   08/02/16 0129  hydroxychloroquine (PLAQUENIL) tablet 200 mg    Comments:  Reported on 01/13/2016     200 mg Oral 2 times daily 08/02/16 0129        Procedures: None  CONSULTS:  nephrology  Time spent: 25- minutes-Greater than 50% of this  time was spent in counseling, explanation of diagnosis, planning of further management, and coordination of care.  MEDICATIONS: Scheduled Meds: . allopurinol  200 mg Oral Daily  . hydroxychloroquine  200 mg Oral BID  . morphine      . sevelamer carbonate  2,400 mg Oral TID WC  . sodium chloride flush  3 mL Intravenous Q12H  . sodium chloride flush  3 mL Intravenous Q12H  . warfarin  7.5 mg Oral ONCE-1800  . Warfarin - Pharmacist Dosing Inpatient   Does not apply q1800   Continuous Infusions: PRN Meds:.sodium chloride, acetaminophen **OR** acetaminophen, albuterol, HYDROcodone-acetaminophen, ondansetron **OR** ondansetron (ZOFRAN) IV, sodium chloride flush   PHYSICAL EXAM: Vital signs: Vitals:   08/03/16 2035 08/03/16 2038 08/04/16 0500 08/04/16 1024  BP:  (!) 98/57 (!) 107/59 100/63  Pulse:  (!) 54 (!) 53 (!) 54  Resp:  16 16 16   Temp:  98.4 F (36.9 C) 97.6 F (36.4 C) 98.4 F (36.9 C)  TempSrc:  Oral Oral Oral  SpO2:  97% 99% 99%  Weight: 106.7 kg (235 lb 3.2 oz)     Height: 6\' 3"  (1.905 m)      Filed Weights   08/03/16 0520 08/03/16 0830 08/03/16 2035  Weight: 106.1 kg (233 lb 14.4 oz) 105.9 kg (233 lb 7.5 oz) 106.7 kg (235 lb 3.2 oz)   Body mass index is 29.4 kg/m.   General appearance :Awake, alert, not in any distress. Speech Clear. Not toxic Looking Eyes:, pupils equally reactive to light and accomodation,no scleral icterus.Pink conjunctiva HEENT: Atraumatic and Normocephalic Neck: supple, no JVD. No cervical  lymphadenopathy. No thyromegaly Resp:Good air entry bilaterally, no added sounds  CVS: S1 S2 regular, no murmurs.  GI: Bowel sounds present, Non tender and not distended with no gaurding, rigidity or rebound.No organomegaly Extremities: B/L Lower Ext shows no edema, both legs are warm to touch Neurology:  speech clear,Non focal, sensation is grossly intact. Psychiatric: Normal judgment and insight. Alert and oriented x 3. Normal mood. Musculoskeletal:No  digital cyanosis Skin:No Rash, warm and dry Wounds:N/A  I have personally reviewed following labs and imaging studies  LABORATORY DATA: CBC:  Recent Labs Lab 08/01/16 1027 08/02/16 0414  WBC 7.0 6.2  NEUTROABS 4.9  --   HGB 11.6* 11.6*  HCT 37.3* 36.5*  MCV 95.9 94.6  PLT 223 330    Basic Metabolic Panel:  Recent Labs Lab 08/01/16 1027 08/01/16 1847 08/01/16 2305 08/02/16 0414 08/03/16 0609 08/04/16 0636  NA 137 139  --  137 137 136  K 8.7* 7.1* 3.6 4.3 5.4* 4.9  CL 102 100*  --  96* 96* 96*  CO2 24 26  --  29 24 29   GLUCOSE 117* 99  --  140* 91 96  BUN 57* 54*  --  27* 39* 24*  CREATININE 9.30* 9.85*  --  6.58* 9.18* 6.63*  CALCIUM 10.1 10.0  --  9.2 9.5 9.3  MG  --   --   --  2.2  --   --   PHOS  --   --   --  5.0* 7.1* 5.4*    GFR: Estimated Creatinine Clearance: 13.1 mL/min (by C-G formula based on SCr of 6.63 mg/dL (H)).  Liver Function Tests:  Recent Labs Lab 08/01/16 1027 08/02/16 0414 08/03/16 0609 08/04/16 0636  AST 41 37  --   --   ALT 18 18  --   --   ALKPHOS 152* 144*  --   --   BILITOT 0.4 0.5  --   --   PROT 8.6* 7.6  --   --   ALBUMIN 4.2 3.6 3.7 3.5   No results for input(s): LIPASE, AMYLASE in the last 168 hours. No results for input(s): AMMONIA in the last 168 hours.  Coagulation Profile:  Recent Labs Lab 08/01/16 1027 08/02/16 0414 08/03/16 0609 08/04/16 0636  INR 2.10 2.22 2.63 2.52    Cardiac Enzymes:  Recent Labs Lab 08/02/16 1536  CKTOTAL 90    BNP (last 3 results) No results for input(s): PROBNP in the last 8760 hours.  HbA1C:  Recent Labs  08/02/16 0414  HGBA1C 6.1*    CBG:  Recent Labs Lab 08/02/16 0128 08/02/16 0807 08/02/16 1135  GLUCAP 97 91 121*    Lipid Profile: No results for input(s): CHOL, HDL, LDLCALC, TRIG, CHOLHDL, LDLDIRECT in the last 72 hours.  Thyroid Function Tests:  Recent Labs  08/02/16 0414  TSH 1.412    Anemia Panel: No results for input(s): VITAMINB12,  FOLATE, FERRITIN, TIBC, IRON, RETICCTPCT in the last 72 hours.  Urine analysis:    Component Value Date/Time   COLORURINE YELLOW 11/10/2015 1800   APPEARANCEUR CLEAR 11/10/2015 1800   LABSPEC 1.012 11/10/2015 1800   PHURINE 5.5 11/10/2015 1800   GLUCOSEU NEGATIVE 11/10/2015 1800   GLUCOSEU NEGATIVE 05/05/2015 1135   HGBUR NEGATIVE 11/10/2015 1800   HGBUR negative 09/21/2009 1008   BILIRUBINUR NEGATIVE 11/10/2015 1800   KETONESUR NEGATIVE 11/10/2015 1800   PROTEINUR NEGATIVE 11/10/2015 1800   UROBILINOGEN 0.2 05/05/2015 1135   NITRITE NEGATIVE 11/10/2015 1800   LEUKOCYTESUR NEGATIVE 11/10/2015  1800    Sepsis Labs: Lactic Acid, Venous    Component Value Date/Time   LATICACIDVEN 1.3 08/01/2016 2009    MICROBIOLOGY: Recent Results (from the past 240 hour(s))  C difficile quick scan w PCR reflex     Status: None   Collection Time: 08/02/16  9:05 AM  Result Value Ref Range Status   C Diff antigen NEGATIVE NEGATIVE Final   C Diff toxin NEGATIVE NEGATIVE Final   C Diff interpretation No C. difficile detected.  Final  Gastrointestinal Panel by PCR , Stool     Status: None   Collection Time: 08/02/16  9:05 AM  Result Value Ref Range Status   Campylobacter species NOT DETECTED NOT DETECTED Final   Plesimonas shigelloides NOT DETECTED NOT DETECTED Final   Salmonella species NOT DETECTED NOT DETECTED Final   Yersinia enterocolitica NOT DETECTED NOT DETECTED Final   Vibrio species NOT DETECTED NOT DETECTED Final   Vibrio cholerae NOT DETECTED NOT DETECTED Final   Enteroaggregative E coli (EAEC) NOT DETECTED NOT DETECTED Final   Enteropathogenic E coli (EPEC) NOT DETECTED NOT DETECTED Final   Enterotoxigenic E coli (ETEC) NOT DETECTED NOT DETECTED Final   Shiga like toxin producing E coli (STEC) NOT DETECTED NOT DETECTED Final   Shigella/Enteroinvasive E coli (EIEC) NOT DETECTED NOT DETECTED Final   Cryptosporidium NOT DETECTED NOT DETECTED Final   Cyclospora cayetanensis NOT  DETECTED NOT DETECTED Final   Entamoeba histolytica NOT DETECTED NOT DETECTED Final   Giardia lamblia NOT DETECTED NOT DETECTED Final   Adenovirus F40/41 NOT DETECTED NOT DETECTED Final   Astrovirus NOT DETECTED NOT DETECTED Final   Norovirus GI/GII NOT DETECTED NOT DETECTED Final   Rotavirus A NOT DETECTED NOT DETECTED Final   Sapovirus (I, II, IV, and V) NOT DETECTED NOT DETECTED Final    RADIOLOGY STUDIES/RESULTS: Ir US Guide Vasc Access Left  Result Date: 08/04/2016 INDICATION: 76 year old with end-stage renal disease. Patient was admitted with fatigue and diarrhea. Recent revision of the left upper arm fistula with vein patch near the brachial artery anastomosis. History of left basilic vein transposition. Requested to evaluate the left upper extremity fistula. EXAM: LEFT UPPER EXTREMITY FISTULOGRAM ; ULTRASOUND GUIDANCE FOR VASCULAR ACCESS MEDICATIONS: None. ANESTHESIA/SEDATION: Moderate Sedation Time:  None FLUOROSCOPY TIME:  Fluoroscopy Time: 1 minutes 30 seconds (59 mGy). COMPLICATIONS: None immediate. PROCEDURE: Informed written consent was obtained from the patient after a thorough discussion of the procedural risks, benefits and alternatives. All questions were addressed. A timeout was performed prior to the initiation of the procedure. The left upper arm fistula was confirmed to be patent with ultrasound. The upper arm was prepped with chlorhexidine. An Angiocath was directed into the draining vein with ultrasound guidance. A series of fistulogram images were obtained. The catheter was removed with manual compression. FINDINGS: There is a left upper arm fistula which is patent. Postoperative changes compatible with a vein patch at the arterial anastomosis and a transposition procedure. There is no significant stenosis. The central veins are patent. Arterial anastomosis is patent. IMPRESSION: Left upper arm AV fistula is patent.  No significant stenosis. ACCESS: This access remains amenable  to future percutaneous interventions as clinically indicated. Electronically Signed   By: Markus Daft M.D.   On: 08/04/2016 11:10   Dg Chest Port 1 View  Result Date: 07/16/2016 CLINICAL DATA:  Weakness, dyspnea, dialysis patient. EXAM: PORTABLE CHEST 1 VIEW COMPARISON:  None. FINDINGS: Cardiomegaly with external defibrillator paddle projecting over the left hemithorax. There is interstitial edema.  No pneumothorax nor definite effusion. Aortic atherosclerosis. Right costophrenic angle is excluded on this study. IMPRESSION: Cardiomegaly with interstitial pulmonary edema. Electronically Signed   By: Ashley Royalty M.D.   On: 07/16/2016 04:23   Ir Dialy Shunt Intro Needle/intracath Initial W/img Left  Result Date: 08/04/2016 INDICATION: 76 year old with end-stage renal disease. Patient was admitted with fatigue and diarrhea. Recent revision of the left upper arm fistula with vein patch near the brachial artery anastomosis. History of left basilic vein transposition. Requested to evaluate the left upper extremity fistula. EXAM: LEFT UPPER EXTREMITY FISTULOGRAM ; ULTRASOUND GUIDANCE FOR VASCULAR ACCESS MEDICATIONS: None. ANESTHESIA/SEDATION: Moderate Sedation Time:  None FLUOROSCOPY TIME:  Fluoroscopy Time: 1 minutes 30 seconds (59 mGy). COMPLICATIONS: None immediate. PROCEDURE: Informed written consent was obtained from the patient after a thorough discussion of the procedural risks, benefits and alternatives. All questions were addressed. A timeout was performed prior to the initiation of the procedure. The left upper arm fistula was confirmed to be patent with ultrasound. The upper arm was prepped with chlorhexidine. An Angiocath was directed into the draining vein with ultrasound guidance. A series of fistulogram images were obtained. The catheter was removed with manual compression. FINDINGS: There is a left upper arm fistula which is patent. Postoperative changes compatible with a vein patch at the arterial  anastomosis and a transposition procedure. There is no significant stenosis. The central veins are patent. Arterial anastomosis is patent. IMPRESSION: Left upper arm AV fistula is patent.  No significant stenosis. ACCESS: This access remains amenable to future percutaneous interventions as clinically indicated. Electronically Signed   By: Markus Daft M.D.   On: 08/04/2016 11:10     LOS: 3 days   Oren Binet, MD  [facility] Pager:[phone]  If 7PM-7AM, please contact night-coverage [email or web address] Password [email or web address] 08/04/2016, 1:28 PM

## 2016-08-04 NOTE — Care Management Important Message (Signed)
Important Message  Patient Details  Name: Bruce Cole MRN: [id number] Date of Birth: [birth year]   Medicare Important Message Given:  Yes    Rubina Basinski, Rory Percy, RN 08/04/2016, 10:54 AM

## 2016-08-05 LAB — CBC
HEMATOCRIT: 35.1 % — AB (ref 39.0–52.0)
Hemoglobin: 11.2 g/dL — ABNORMAL LOW (ref 13.0–17.0)
MCH: 29.5 pg (ref 26.0–34.0)
MCHC: 31.9 g/dL (ref 30.0–36.0)
MCV: 92.4 fL (ref 78.0–100.0)
Platelets: 206 10*3/uL (ref 150–400)
RBC: 3.8 MIL/uL — AB (ref 4.22–5.81)
RDW: 15.4 % (ref 11.5–15.5)
WBC: 6.7 10*3/uL (ref 4.0–10.5)

## 2016-08-05 LAB — RENAL FUNCTION PANEL
ANION GAP: 13 (ref 5–15)
Albumin: 3.5 g/dL (ref 3.5–5.0)
BUN: 42 mg/dL — ABNORMAL HIGH (ref 6–20)
CHLORIDE: 95 mmol/L — AB (ref 101–111)
CO2: 27 mmol/L (ref 22–32)
Calcium: 9.3 mg/dL (ref 8.9–10.3)
Creatinine, Ser: 9.52 mg/dL — ABNORMAL HIGH (ref 0.61–1.24)
GFR, EST AFRICAN AMERICAN: 6 mL/min — AB (ref >60)
GFR, EST NON AFRICAN AMERICAN: 5 mL/min — AB (ref >60)
Glucose, Bld: 105 mg/dL — ABNORMAL HIGH (ref 65–99)
POTASSIUM: 4.8 mmol/L (ref 3.5–5.1)
Phosphorus: 6.3 mg/dL — ABNORMAL HIGH (ref 2.5–4.6)
Sodium: 135 mmol/L (ref 135–145)

## 2016-08-05 LAB — PROTIME-INR
INR: 2.58
Prothrombin Time: 28.2 seconds — ABNORMAL HIGH (ref 11.4–15.2)

## 2016-08-05 MED ORDER — HEPARIN SODIUM (PORCINE) 1000 UNIT/ML DIALYSIS: 1000.0000 [IU] | INTRAMUSCULAR | Status: DC | PRN

## 2016-08-05 MED ORDER — LIDOCAINE HCL (PF) 1 % IJ SOLN: 5.0000 mL | INTRAMUSCULAR | Status: DC | PRN

## 2016-08-05 MED ORDER — ALTEPLASE 2 MG IJ SOLR: 2.0000 mg | Freq: Once | INTRAMUSCULAR | Status: DC | PRN

## 2016-08-05 MED ORDER — SODIUM CHLORIDE 0.9 % IV SOLN: 100.0000 mL | INTRAVENOUS | Status: DC | PRN

## 2016-08-05 MED ORDER — LIDOCAINE-PRILOCAINE 2.5-2.5 % EX CREA: 1.0000 "application " | TOPICAL_CREAM | CUTANEOUS | Status: DC | PRN

## 2016-08-05 MED ORDER — PENTAFLUOROPROP-TETRAFLUOROETH EX AERO: 1.0000 "application " | INHALATION_SPRAY | CUTANEOUS | Status: DC | PRN

## 2016-08-05 NOTE — Procedures (Signed)
I have seen and examined this patient and agree with the plan of care   K 4.8   Ca 9.3    Phos 6.3    Hb 11.2  BP  108/62      Recurrent Hyperkalemia:    Resolved with HD.-do not see any offending meds, CK within normal limits.  could be secondary to dialysis recirculation.      Bruce Cole W 08/05/2016, 5:29 PM

## 2016-08-05 NOTE — Telephone Encounter (Signed)
lvm for pt to call back to schedule appt. Held appt time slot for pt in providers schedule for next week. On vm asked pt to call back to confirm if date and time of appt will work for him

## 2016-08-05 NOTE — Discharge Summary (Signed)
PATIENT DETAILS Name: Bruce Cole Age: 76 y.o. Sex: male Date of Birth: 11/27/1940 MRN: [id number]. Admitting Physician: Toy Baker, MD PCP:O'SULLIVAN,[name] S., NP  Admit Date: 08/01/2016 Discharge date: 08/05/2016  Recommendations for Outpatient Follow-up:  1. Follow up with PCP in 1-2 weeks 2. Please obtain BMP/CBC in one week  Admitted From:  Home  Disposition: [place]: No  Equipment/Devices: None  Discharge Condition: Stable  CODE STATUS: FULL CODE  Diet recommendation:  Heart Healthy   Brief Summary: See H&P, Labs, Consult and Test reports for all details in brief,Patient is a 76 y.o. male with PMHx of ESRD -on HD, Afib on coumadin-presented to the ED for evaluation of weakness/diarrhea-found to have severe hyperkalemia-requiring emergent HD. See below for further details.   Brief Hospital Course: Recurrent Hyperkalemia:Resolved with HD. ?Etiology-do not see any offending meds, CK within normal limits. Per nephrology, etiology could be secondary to dialysis recirculation. Patient appears to be very compliant with diet restrictions. Plans are to continue to monitor electrolytes closely, home after dialysis today. Spoke with nephrology, no further recommendations at this time. Note, underwent a fistulogram on 1/18 showed a patent fistula with no significant stenosis  ESRD:Renal followed closely while the patient was hospitalized-usual schedule is TTS  Diarrheal illness: resolved-probably viral synd. Stool GI pathogen panel, C. difficile PCR negative.  Chronic Systolic CHF (EF 23% to 76% by TTE 07/17):Euvolemic on exam-fluid removal with HD.   Afib: Rate controlled-INR therapeutic-coumadin per pharmacy while inpatient-we'll continue to follow at the Coumadin clinic as outpatient  Hx of RA: stable-on Plaquenil.  Anemia: likely due to ESRD-per renal  Procedures/Studies: 1/18 fistulogram  Discharge Diagnoses:  Active  Problems:   Essential hypertension   ATRIAL FIBRILLATION   DM (diabetes mellitus), type 2 with renal complications (HCC)   Long term current use of anticoagulant therapy   Chronic combined systolic and diastolic heart failure (HCC)   OSA (obstructive sleep apnea)   ESRD on dialysis [facility])   Hyperkalemia   Discharge Instructions:  Activity:  As tolerated with Full fall precautions use walker/cane & assistance as needed  Discharge Instructions    Diet - low sodium heart healthy    Complete by:  As directed    Increase activity slowly    Complete by:  As directed      Allergies as of 08/05/2016      Reactions   Ace Inhibitors Other (See Comments)   Worsening renal insufficiency      Medication List    TAKE these medications   acetaminophen 500 MG tablet Commonly known as:  TYLENOL Take 500 mg by mouth every 6 (six) hours as needed for mild pain.   albuterol (2.5 MG/3ML) 0.083% nebulizer solution Commonly known as:  PROVENTIL Take 2.5 mg by nebulization every 4 (four) hours as needed for shortness of breath. Reported on 09/25/2015   allopurinol 100 MG tablet Commonly known as:  ZYLOPRIM Take 2 tablets (200 mg total) by mouth daily.   fluticasone 50 MCG/ACT nasal spray Commonly known as:  FLONASE Place 2 sprays into both nostrils daily. What changed:  when to take this  reasons to take this   hydroxychloroquine 200 MG tablet Commonly known as:  PLAQUENIL Take 200 mg by mouth 2 (two) times daily. Reported on 01/13/2016   RENA-VITE RX 1 MG Tabs Take 1 tablet by mouth at bedtime.   RENVELA 800 MG tablet Generic drug:  sevelamer carbonate Take 3 tablets (2,400 mg total) by mouth 3 (  three) times daily with meals.   warfarin 7.5 MG tablet Commonly known as:  COUMADIN Take 7.5 mg by mouth daily.      Follow-up Information    Nance Pear., NP. Schedule an appointment as soon as possible for a visit in 1 week(s).   Specialty:  Internal Medicine Contact  information: [address] [place] [address] [phone]        Hemodialysis center Follow up.   Why:  At your usual schedule         Allergies  Allergen Reactions  . Ace Inhibitors Other (See Comments)    Worsening renal insufficiency   Consultations:   nephrology  Other Procedures/Studies: Ir US Guide Vasc Access Left  Result Date: 08/04/2016 INDICATION: 76 year old with end-stage renal disease. Patient was admitted with fatigue and diarrhea. Recent revision of the left upper arm fistula with vein patch near the brachial artery anastomosis. History of left basilic vein transposition. Requested to evaluate the left upper extremity fistula. EXAM: LEFT UPPER EXTREMITY FISTULOGRAM ; ULTRASOUND GUIDANCE FOR VASCULAR ACCESS MEDICATIONS: None. ANESTHESIA/SEDATION: Moderate Sedation Time:  None FLUOROSCOPY TIME:  Fluoroscopy Time: 1 minutes 30 seconds (59 mGy). COMPLICATIONS: None immediate. PROCEDURE: Informed written consent was obtained from the patient after a thorough discussion of the procedural risks, benefits and alternatives. All questions were addressed. A timeout was performed prior to the initiation of the procedure. The left upper arm fistula was confirmed to be patent with ultrasound. The upper arm was prepped with chlorhexidine. An Angiocath was directed into the draining vein with ultrasound guidance. A series of fistulogram images were obtained. The catheter was removed with manual compression. FINDINGS: There is a left upper arm fistula which is patent. Postoperative changes compatible with a vein patch at the arterial anastomosis and a transposition procedure. There is no significant stenosis. The central veins are patent. Arterial anastomosis is patent. IMPRESSION: Left upper arm AV fistula is patent.  No significant stenosis. ACCESS: This access remains amenable to future percutaneous interventions as clinically indicated. Electronically Signed   By: Markus Daft M.D.   On: 08/04/2016 11:10   Dg Chest Port 1 View  Result Date: 07/16/2016 CLINICAL DATA:  Weakness, dyspnea, dialysis patient. EXAM: PORTABLE CHEST 1 VIEW COMPARISON:  None. FINDINGS: Cardiomegaly with external defibrillator paddle projecting over the left hemithorax. There is interstitial edema. No pneumothorax nor definite effusion. Aortic atherosclerosis. Right costophrenic angle is excluded on this study. IMPRESSION: Cardiomegaly with interstitial pulmonary edema. Electronically Signed   By: Ashley Royalty M.D.   On: 07/16/2016 04:23   Ir Dialy Shunt Intro Needle/intracath Initial W/img Left  Result Date: 08/04/2016 INDICATION: 76 year old with end-stage renal disease. Patient was admitted with fatigue and diarrhea. Recent revision of the left upper arm fistula with vein patch near the brachial artery anastomosis. History of left basilic vein transposition. Requested to evaluate the left upper extremity fistula. EXAM: LEFT UPPER EXTREMITY FISTULOGRAM ; ULTRASOUND GUIDANCE FOR VASCULAR ACCESS MEDICATIONS: None. ANESTHESIA/SEDATION: Moderate Sedation Time:  None FLUOROSCOPY TIME:  Fluoroscopy Time: 1 minutes 30 seconds (59 mGy). COMPLICATIONS: None immediate. PROCEDURE: Informed written consent was obtained from the patient after a thorough discussion of the procedural risks, benefits and alternatives. All questions were addressed. A timeout was performed prior to the initiation of the procedure. The left upper arm fistula was confirmed to be patent with ultrasound. The upper arm was prepped with chlorhexidine. An Angiocath was directed into the draining vein with ultrasound guidance. A series of fistulogram  images were obtained. The catheter was removed with manual compression. FINDINGS: There is a left upper arm fistula which is patent. Postoperative changes compatible with a vein patch at the arterial anastomosis and a transposition procedure. There is no significant stenosis. The central veins  are patent. Arterial anastomosis is patent. IMPRESSION: Left upper arm AV fistula is patent.  No significant stenosis. ACCESS: This access remains amenable to future percutaneous interventions as clinically indicated. Electronically Signed   By: Markus Daft M.D.   On: 08/04/2016 11:10      TODAY-DAY OF DISCHARGE:  Subjective:   Bruce Cole today has no headache,no chest abdominal pain,no new weakness tingling or numbness, feels much better wants to go home today.   Objective:   Blood pressure (!) 96/49, pulse (!) 47, temperature 98.4 F (36.9 C), temperature source Oral, resp. rate 17, height 6\' 3"  (1.905 m), weight 106.1 kg (234 lb), SpO2 97 %.  Intake/Output Summary (Last 24 hours) at 08/05/16 0957 Last data filed at 08/05/16 0600  Gross per 24 hour  Intake              240 ml  Output                0 ml  Net              240 ml   Filed Weights   08/03/16 0830 08/03/16 2035 08/04/16 2117  Weight: 105.9 kg (233 lb 7.5 oz) 106.7 kg (235 lb 3.2 oz) 106.1 kg (234 lb)    Exam: Awake Alert, Oriented *3, No new F.N deficits, Normal affect [place].AT,PERRAL Supple Neck,No JVD, No cervical lymphadenopathy appriciated.  Symmetrical Chest wall movement, Good air movement bilaterally, CTAB RRR,No Gallops,Rubs or new Murmurs, No Parasternal Heave +ve B.Sounds, Abd Soft, Non tender, No organomegaly appriciated, No rebound -guarding or rigidity. No Cyanosis, Clubbing or edema, No new Rash or bruise   PERTINENT RADIOLOGIC STUDIES: Ir US Guide Vasc Access Left  Result Date: 08/04/2016 INDICATION: 76 year old with end-stage renal disease. Patient was admitted with fatigue and diarrhea. Recent revision of the left upper arm fistula with vein patch near the brachial artery anastomosis. History of left basilic vein transposition. Requested to evaluate the left upper extremity fistula. EXAM: LEFT UPPER EXTREMITY FISTULOGRAM ; ULTRASOUND GUIDANCE FOR VASCULAR ACCESS MEDICATIONS: None.  ANESTHESIA/SEDATION: Moderate Sedation Time:  None FLUOROSCOPY TIME:  Fluoroscopy Time: 1 minutes 30 seconds (59 mGy). COMPLICATIONS: None immediate. PROCEDURE: Informed written consent was obtained from the patient after a thorough discussion of the procedural risks, benefits and alternatives. All questions were addressed. A timeout was performed prior to the initiation of the procedure. The left upper arm fistula was confirmed to be patent with ultrasound. The upper arm was prepped with chlorhexidine. An Angiocath was directed into the draining vein with ultrasound guidance. A series of fistulogram images were obtained. The catheter was removed with manual compression. FINDINGS: There is a left upper arm fistula which is patent. Postoperative changes compatible with a vein patch at the arterial anastomosis and a transposition procedure. There is no significant stenosis. The central veins are patent. Arterial anastomosis is patent. IMPRESSION: Left upper arm AV fistula is patent.  No significant stenosis. ACCESS: This access remains amenable to future percutaneous interventions as clinically indicated. Electronically Signed   By: Markus Daft M.D.   On: 08/04/2016 11:10   Dg Chest Port 1 View  Result Date: 07/16/2016 CLINICAL DATA:  Weakness, dyspnea, dialysis patient. EXAM: PORTABLE CHEST 1 VIEW COMPARISON:  None. FINDINGS: Cardiomegaly with external defibrillator paddle projecting over the left hemithorax. There is interstitial edema. No pneumothorax nor definite effusion. Aortic atherosclerosis. Right costophrenic angle is excluded on this study. IMPRESSION: Cardiomegaly with interstitial pulmonary edema. Electronically Signed   By: Ashley Royalty M.D.   On: 07/16/2016 04:23   Ir Dialy Shunt Intro Needle/intracath Initial W/img Left  Result Date: 08/04/2016 INDICATION: 76 year old with end-stage renal disease. Patient was admitted with fatigue and diarrhea. Recent revision of the left upper arm fistula with  vein patch near the brachial artery anastomosis. History of left basilic vein transposition. Requested to evaluate the left upper extremity fistula. EXAM: LEFT UPPER EXTREMITY FISTULOGRAM ; ULTRASOUND GUIDANCE FOR VASCULAR ACCESS MEDICATIONS: None. ANESTHESIA/SEDATION: Moderate Sedation Time:  None FLUOROSCOPY TIME:  Fluoroscopy Time: 1 minutes 30 seconds (59 mGy). COMPLICATIONS: None immediate. PROCEDURE: Informed written consent was obtained from the patient after a thorough discussion of the procedural risks, benefits and alternatives. All questions were addressed. A timeout was performed prior to the initiation of the procedure. The left upper arm fistula was confirmed to be patent with ultrasound. The upper arm was prepped with chlorhexidine. An Angiocath was directed into the draining vein with ultrasound guidance. A series of fistulogram images were obtained. The catheter was removed with manual compression. FINDINGS: There is a left upper arm fistula which is patent. Postoperative changes compatible with a vein patch at the arterial anastomosis and a transposition procedure. There is no significant stenosis. The central veins are patent. Arterial anastomosis is patent. IMPRESSION: Left upper arm AV fistula is patent.  No significant stenosis. ACCESS: This access remains amenable to future percutaneous interventions as clinically indicated. Electronically Signed   By: Markus Daft M.D.   On: 08/04/2016 11:10     PERTINENT LAB RESULTS: CBC:  Recent Labs  08/05/16 0512  WBC 6.7  HGB 11.2*  HCT 35.1*  PLT 206   CMET CMP     Component Value Date/Time   NA 136 08/04/2016 0636   NA 137 04/08/2016   K 4.9 08/04/2016 0636   CL 96 (L) 08/04/2016 0636   CO2 29 08/04/2016 0636   GLUCOSE 96 08/04/2016 0636   BUN 24 (H) 08/04/2016 0636   BUN 38 (A) 04/08/2016   CREATININE 6.63 (H) 08/04/2016 0636   CREATININE 1.77 (H) 02/13/2013 1505   CALCIUM 9.3 08/04/2016 0636   PROT 7.6 08/02/2016 0414    ALBUMIN 3.5 08/04/2016 0636   AST 37 08/02/2016 0414   ALT 18 08/02/2016 0414   ALKPHOS 144 (H) 08/02/2016 0414   BILITOT 0.5 08/02/2016 0414   GFRNONAA 7 (L) 08/04/2016 0636   GFRAA 9 (L) 08/04/2016 0636    GFR Estimated Creatinine Clearance: 13.1 mL/min (by C-G formula based on SCr of 6.63 mg/dL (H)). No results for input(s): LIPASE, AMYLASE in the last 72 hours.  Recent Labs  08/02/16 1536  CKTOTAL 90   Invalid input(s): POCBNP No results for input(s): DDIMER in the last 72 hours. No results for input(s): HGBA1C in the last 72 hours. No results for input(s): CHOL, HDL, LDLCALC, TRIG, CHOLHDL, LDLDIRECT in the last 72 hours. No results for input(s): TSH, T4TOTAL, T3FREE, THYROIDAB in the last 72 hours.  Invalid input(s): FREET3 No results for input(s): VITAMINB12, FOLATE, FERRITIN, TIBC, IRON, RETICCTPCT in the last 72 hours. Coags:  Recent Labs  08/04/16 0636 08/05/16 0512  INR 2.52 2.58   Microbiology: Recent Results (from the past 240 hour(s))  C difficile quick scan w PCR reflex  Status: None   Collection Time: 08/02/16  9:05 AM  Result Value Ref Range Status   C Diff antigen NEGATIVE NEGATIVE Final   C Diff toxin NEGATIVE NEGATIVE Final   C Diff interpretation No C. difficile detected.  Final  Gastrointestinal Panel by PCR , Stool     Status: None   Collection Time: 08/02/16  9:05 AM  Result Value Ref Range Status   Campylobacter species NOT DETECTED NOT DETECTED Final   Plesimonas shigelloides NOT DETECTED NOT DETECTED Final   Salmonella species NOT DETECTED NOT DETECTED Final   Yersinia enterocolitica NOT DETECTED NOT DETECTED Final   Vibrio species NOT DETECTED NOT DETECTED Final   Vibrio cholerae NOT DETECTED NOT DETECTED Final   Enteroaggregative E coli (EAEC) NOT DETECTED NOT DETECTED Final   Enteropathogenic E coli (EPEC) NOT DETECTED NOT DETECTED Final   Enterotoxigenic E coli (ETEC) NOT DETECTED NOT DETECTED Final   Shiga like toxin producing E  coli (STEC) NOT DETECTED NOT DETECTED Final   Shigella/Enteroinvasive E coli (EIEC) NOT DETECTED NOT DETECTED Final   Cryptosporidium NOT DETECTED NOT DETECTED Final   Cyclospora cayetanensis NOT DETECTED NOT DETECTED Final   Entamoeba histolytica NOT DETECTED NOT DETECTED Final   Giardia lamblia NOT DETECTED NOT DETECTED Final   Adenovirus F40/41 NOT DETECTED NOT DETECTED Final   Astrovirus NOT DETECTED NOT DETECTED Final   Norovirus GI/GII NOT DETECTED NOT DETECTED Final   Rotavirus A NOT DETECTED NOT DETECTED Final   Sapovirus (I, II, IV, and V) NOT DETECTED NOT DETECTED Final    FURTHER DISCHARGE INSTRUCTIONS:  Get Medicines reviewed and adjusted: Please take all your medications with you for your next visit with your Primary MD  Laboratory/radiological data: Please request your Primary MD to go over all hospital tests and procedure/radiological results at the follow up, please ask your Primary MD to get all Hospital records sent to his/her office.  In some cases, they will be blood work, cultures and biopsy results pending at the time of your discharge. Please request that your primary care M.D. goes through all the records of your hospital data and follows up on these results.  Also Note the following: If you experience worsening of your admission symptoms, develop shortness of breath, life threatening emergency, suicidal or homicidal thoughts you must seek medical attention immediately by calling 911 or calling your MD immediately  if symptoms less severe.  You must read complete instructions/literature along with all the possible adverse reactions/side effects for all the Medicines you take and that have been prescribed to you. Take any new Medicines after you have completely understood and accpet all the possible adverse reactions/side effects.   Do not drive when taking Pain medications or sleeping medications (Benzodaizepines)  Do not take more than prescribed Pain, Sleep and  Anxiety Medications. It is not advisable to combine anxiety,sleep and pain medications without talking with your primary care practitioner  Special Instructions: If you have smoked or chewed Tobacco  in the last 2 yrs please stop smoking, stop any regular Alcohol  and or any Recreational drug use.  Wear Seat belts while driving.  Please note: You were cared for by a hospitalist during your hospital stay. Once you are discharged, your primary care physician will handle any further medical issues. Please note that NO REFILLS for any discharge medications will be authorized once you are discharged, as it is imperative that you return to your primary care physician (or establish a relationship with a primary care  physician if you do not have one) for your post hospital discharge needs so that they can reassess your need for medications and monitor your lab values.  Total Time spent coordinating discharge including counseling, education and face to face time equals 45 minutes.  SignedOren Binet 08/05/2016 9:57 AM

## 2016-08-05 NOTE — Progress Notes (Signed)
Discharge instructions and medications discussed with patient.  All questions answered.  

## 2016-08-06 ENCOUNTER — Other Ambulatory Visit: Payer: Self-pay | Admitting: Family

## 2016-08-06 DIAGNOSIS — N2581 Secondary hyperparathyroidism of renal origin: Secondary | ICD-10-CM | POA: Diagnosis not present

## 2016-08-06 DIAGNOSIS — I4891 Unspecified atrial fibrillation: Secondary | ICD-10-CM | POA: Diagnosis not present

## 2016-08-06 DIAGNOSIS — D631 Anemia in chronic kidney disease: Secondary | ICD-10-CM | POA: Diagnosis not present

## 2016-08-06 DIAGNOSIS — Z23 Encounter for immunization: Secondary | ICD-10-CM | POA: Diagnosis not present

## 2016-08-06 DIAGNOSIS — N186 End stage renal disease: Secondary | ICD-10-CM | POA: Diagnosis not present

## 2016-08-08 ENCOUNTER — Telehealth: Payer: Self-pay | Admitting: Behavioral Health

## 2016-08-08 NOTE — Telephone Encounter (Signed)
Bruce Cole-- we received request for allopurinol refill. Looks like last Rx was 11/2015 by another MD outside of our office. Also noted in medication history was a notice that medication was d/c'd with note to stop taking at discharge.  Please advise if pt should still be taking medication and if ok to send refill?

## 2016-08-08 NOTE — Telephone Encounter (Signed)
Attempted to reach patient for TCM/Hospital Follow-up call. Left message for patient to return call when available.    

## 2016-08-09 DIAGNOSIS — N2581 Secondary hyperparathyroidism of renal origin: Secondary | ICD-10-CM | POA: Diagnosis not present

## 2016-08-09 DIAGNOSIS — I4891 Unspecified atrial fibrillation: Secondary | ICD-10-CM | POA: Diagnosis not present

## 2016-08-09 DIAGNOSIS — Z23 Encounter for immunization: Secondary | ICD-10-CM | POA: Diagnosis not present

## 2016-08-09 DIAGNOSIS — N186 End stage renal disease: Secondary | ICD-10-CM | POA: Diagnosis not present

## 2016-08-09 DIAGNOSIS — D631 Anemia in chronic kidney disease: Secondary | ICD-10-CM | POA: Diagnosis not present

## 2016-08-09 NOTE — Telephone Encounter (Signed)
Transition Care Management Follow-up Telephone Call  PCP:O'SULLIVAN,[name] S., NP  Admit Date: 08/01/2016 Discharge date: 08/05/2016  Recommendations for Outpatient Follow-up:  1. Follow up with PCP in 1-2 weeks 2. Please obtain BMP/CBC in one week   How have you been since you were released from the hospital? Patient stated, "I'm doing ok".   Do you understand why you were in the hospital? yes   Do you understand the discharge instructions? yes   Where were you discharged to? Home   Items Reviewed:  Medications reviewed: yes  Allergies reviewed: yes  Dietary changes reviewed: yes, low sodium-heart healthy  Referrals reviewed: yes, Follow up with PCP in 1-2 weeks   Functional Questionnaire:   Activities of Daily Living (ADLs):   He states they are independent in the following: ambulation, bathing and hygiene, feeding, continence, grooming, toileting and dressing States they require assistance with the following: None   Any transportation issues/concerns?: no   Any patient concerns? no   Confirmed importance and date/time of follow-up visits scheduled yes, 08/17/16 at 8:30 AM.  Provider Appointment booked with Debbrah Alar, NP.  Confirmed with patient if condition begins to worsen call PCP or go to the ER.  Patient was given the office number and encouraged to call back with question or concerns.  : yes

## 2016-08-09 NOTE — Telephone Encounter (Signed)
I think he should stop due to being on dialysis.  Call if recurrent gout symptoms.

## 2016-08-10 NOTE — Telephone Encounter (Signed)
Looks like refill was sent to pharmacy on 08/08/16. Spoke with Mitzi Hansen at [facility] and he states Rx has not been picked up by pt yet. Advised him to cancel rx as pt should not be taking it at this time. He will cancel Rx. Left message for pt to return my call.

## 2016-08-11 DIAGNOSIS — N186 End stage renal disease: Secondary | ICD-10-CM | POA: Diagnosis not present

## 2016-08-11 DIAGNOSIS — I4891 Unspecified atrial fibrillation: Secondary | ICD-10-CM | POA: Diagnosis not present

## 2016-08-11 DIAGNOSIS — Z23 Encounter for immunization: Secondary | ICD-10-CM | POA: Diagnosis not present

## 2016-08-11 DIAGNOSIS — D631 Anemia in chronic kidney disease: Secondary | ICD-10-CM | POA: Diagnosis not present

## 2016-08-11 DIAGNOSIS — N2581 Secondary hyperparathyroidism of renal origin: Secondary | ICD-10-CM | POA: Diagnosis not present

## 2016-08-12 ENCOUNTER — Telehealth: Payer: Self-pay | Admitting: Family

## 2016-08-12 LAB — PROTIME-INR: INR: 2.5 — AB (ref 0.9–1.1)

## 2016-08-12 NOTE — Telephone Encounter (Signed)
Pt called back in to follow up on previous message sent. Answered call. Pt came on the line very rude because he hasn't received a call back. Pt says that he's also been on hold for 25 minutes. Apologized   Transferred call to PCP's CMA to assist pt further.

## 2016-08-12 NOTE — Telephone Encounter (Signed)
Notified pt of below and apologized for any inconvenience but we are in and out of rooms with pt's during the day so we are not always able to respond to concerns immediately.

## 2016-08-12 NOTE — Telephone Encounter (Signed)
Notified pt of below and he voices understanding. 

## 2016-08-12 NOTE — Telephone Encounter (Signed)
He can use coricidin HPB.

## 2016-08-12 NOTE — Telephone Encounter (Signed)
Caller name: Relationship to patient: Self Can be reached: [phone]  Pharmacy:  Reason for call: Patient request call back from nurse to find out what type of medication he can take for cold symptoms

## 2016-08-13 DIAGNOSIS — Z23 Encounter for immunization: Secondary | ICD-10-CM | POA: Diagnosis not present

## 2016-08-13 DIAGNOSIS — I4891 Unspecified atrial fibrillation: Secondary | ICD-10-CM | POA: Diagnosis not present

## 2016-08-13 DIAGNOSIS — N2581 Secondary hyperparathyroidism of renal origin: Secondary | ICD-10-CM | POA: Diagnosis not present

## 2016-08-13 DIAGNOSIS — N186 End stage renal disease: Secondary | ICD-10-CM | POA: Diagnosis not present

## 2016-08-13 DIAGNOSIS — D631 Anemia in chronic kidney disease: Secondary | ICD-10-CM | POA: Diagnosis not present

## 2016-08-16 ENCOUNTER — Other Ambulatory Visit (HOSPITAL_COMMUNITY): Payer: Self-pay | Admitting: Internal Medicine

## 2016-08-16 DIAGNOSIS — D631 Anemia in chronic kidney disease: Secondary | ICD-10-CM | POA: Diagnosis not present

## 2016-08-16 DIAGNOSIS — N2581 Secondary hyperparathyroidism of renal origin: Secondary | ICD-10-CM | POA: Diagnosis not present

## 2016-08-16 DIAGNOSIS — I4891 Unspecified atrial fibrillation: Secondary | ICD-10-CM | POA: Diagnosis not present

## 2016-08-16 DIAGNOSIS — N186 End stage renal disease: Secondary | ICD-10-CM | POA: Diagnosis not present

## 2016-08-16 DIAGNOSIS — Z23 Encounter for immunization: Secondary | ICD-10-CM | POA: Diagnosis not present

## 2016-08-17 ENCOUNTER — Ambulatory Visit (INDEPENDENT_AMBULATORY_CARE_PROVIDER_SITE_OTHER): Payer: Medicare Other | Admitting: Family

## 2016-08-17 ENCOUNTER — Telehealth: Payer: Self-pay | Admitting: Family

## 2016-08-17 ENCOUNTER — Encounter: Payer: Self-pay | Admitting: Family

## 2016-08-17 VITALS — BP 108/59 | HR 53 | Temp 98.7°F | Ht 77.0 in | Wt 236.2 lb

## 2016-08-17 DIAGNOSIS — Z992 Dependence on renal dialysis: Secondary | ICD-10-CM

## 2016-08-17 DIAGNOSIS — N186 End stage renal disease: Secondary | ICD-10-CM | POA: Diagnosis not present

## 2016-08-17 DIAGNOSIS — M1A9XX0 Chronic gout, unspecified, without tophus (tophi): Secondary | ICD-10-CM | POA: Diagnosis not present

## 2016-08-17 DIAGNOSIS — I158 Other secondary hypertension: Secondary | ICD-10-CM | POA: Diagnosis not present

## 2016-08-17 DIAGNOSIS — Z7901 Long term (current) use of anticoagulants: Secondary | ICD-10-CM

## 2016-08-17 DIAGNOSIS — E875 Hyperkalemia: Secondary | ICD-10-CM

## 2016-08-17 DIAGNOSIS — M059 Rheumatoid arthritis with rheumatoid factor, unspecified: Secondary | ICD-10-CM | POA: Diagnosis not present

## 2016-08-17 LAB — POCT INR: INR: 3.1

## 2016-08-17 NOTE — Assessment & Plan Note (Signed)
Stable off of allopurinol. Will monitor.

## 2016-08-17 NOTE — Telephone Encounter (Signed)
INR 3.1 today.  Patient advise to take 1/2 tablet of coumadin tonight and then resume 1 tablet by mouth once daily.  Could you please arrange a follow up in coumadin clinic in 1 week? Thank you.

## 2016-08-17 NOTE — Assessment & Plan Note (Signed)
Management per rheumatology.  Advised pt to arrange a follow up appointment.

## 2016-08-17 NOTE — Assessment & Plan Note (Signed)
Patient continues HD 3 x weekly.

## 2016-08-17 NOTE — Addendum Note (Signed)
Addended by: Harl Bowie on: 08/17/2016 10:12 AM   Modules accepted: Orders

## 2016-08-17 NOTE — Telephone Encounter (Signed)
Returned a call to pt & had to leave a message on voicemail.

## 2016-08-17 NOTE — Telephone Encounter (Signed)
Per his PCP-[name] Inda Castle the pt's INR was 3.1 today and she instructed pt to take 1/2 his dose today then continue taking normal dose.  Spoke with pt & scheduled pt for follow INR on 08/24/16. Pt has been in the hospital from  08/01/16-08/05/16; INR's 1/15-2.10, 1/16-2.22, 1/17-2.63, 1/18-2.52, 1/19-2.58 & Warfarin doses were 1/16-7.5mg , 1/17-7.5mg , 1/18-7.5mg  then d/c on normal dose of 7.5mg  daily.

## 2016-08-17 NOTE — Patient Instructions (Signed)
Please arrange follow up with Dr. Estanislado Pandy. Let me know if you have any gout flares off of the allopurinol.

## 2016-08-17 NOTE — Progress Notes (Addendum)
Subjective:    Patient ID: Bruce Cole, male    DOB: 05/05/1941, 76 y.o.   MRN: [id number]  HPI   Bruce Cole is a 76 yr old male who presents today for hospital follow up. He was hospitalized 08/01/16-1-19/18.   Discharge summary is reviewed.  He was admitted with weakness/diarrhea and found to have severe hyperkalemia (K+ was 8.7 !) requiring emergent hemodialysis. C diff testing was negative. Stool studies neg.   He reports doing well since he returned home. His only complaint is some arthritis pain in his hands and some back stiffness.  He declines PT referral for his back stiffness.   Gout- he is off of allopurinol. Denies gout symptoms.  Monitor.   RA- sees Deveshwar. Arthitis pain in hands has been bothering him. Reports he has not seen rheumatology in a while.   Tuesday Thursday Saturday  Review of Systems See HPI  Past Medical History:  Diagnosis Date  . Acute blood loss anemia   . Atrial fibrillation (HCC)    Chronic  . Chronic combined systolic and diastolic heart failure ([place])   . Colon polyp 2000  . Dysrhythmia    hx  . ESRD on hemodialysis ([place])    [place]  . Essential hypertension   . Gastritis and gastroduodenitis   . GI bleed   . Gout   . Heart murmur   . History of blood transfusion   . History of kidney stones   . Nephrolithiasis   . OSA (obstructive sleep apnea) 09/02/2013    IMPRESSION :  1. Mild obstructive sleep apnea with hypopneas causing sleep fragmentation and moderate oxygen desaturation.  2. Short runs of nonsustained VT were noted. His beta blocker may need to be titrated 3. Significant PLM's were noted, the PLM arousal index was low. Please correlate with clinical history of restless leg syndrome.  4. Sleep efficiency was poor.  RECOMMENDATION:  1. Treatment options for this degree of sleep disordered breathing include weight loss and positional therapy to avoid supine sleep 2. Consider titrating beta blocker further, defer  to cardiologist 3. Patient should be cautioned against driving when sleepy.They should be asked to avoid medications with sedative side effects     . Osteoarthritis of right hip   . Pneumonia    hx 30 yrs ago  . Primary osteoarthritis of right hip   . Rheumatoid arthritis ([place])   . Thrombocytopenia [facility])      Social History   Social History  . Marital status: Single    Spouse name: N/A  . Number of children: 3  . Years of education: N/A   Occupational History  . retired Programmer, multimedia  Retired   Social History Main Topics  . Smoking status: Never Smoker  . Smokeless tobacco: Never Used  . Alcohol use No     Comment: occasional  . Drug use: No  . Sexual activity: Not on file   Other Topics Concern  . Not on file   Social History Narrative   Former [employer]   Admitted to [place] [facility] 11/25/15   Widowed   Never smoked   FULL CODE    Past Surgical History:  Procedure Laterality Date  . AV FISTULA PLACEMENT Left 08/26/2015   Procedure: LEFT RADIOCEPHALIC FISTULA CREATION;  Surgeon: Rosetta Posner, MD;  Location: [place];  Service: Vascular;  Laterality: Left;  . AV FISTULA PLACEMENT Left 11/23/2015   Procedure: ARTERIOVENOUS (AV) FISTULA  CREATION;  Surgeon: Rosetta Posner, MD;  Location: [place];  Service: Vascular;  Laterality: Left;  . BACK SURGERY     x2- discectomy  . CHOLECYSTECTOMY  1994  . CYSTOSCOPY/RETROGRADE/URETEROSCOPY/STONE EXTRACTION WITH BASKET    . ESOPHAGOGASTRODUODENOSCOPY N/A 11/13/2015   Procedure: ESOPHAGOGASTRODUODENOSCOPY (EGD);  Surgeon: Irene Shipper, MD;  Location: [facility] ENDOSCOPY;  Service: Endoscopy;  Laterality: N/A;  . INSERTION OF DIALYSIS CATHETER Left 11/23/2015   Procedure: INSERTION OF DIALYSIS CATHETER;  Surgeon: Rosetta Posner, MD;  Location: [place];  Service: Vascular;  Laterality: Left;  . IR GENERIC HISTORICAL Left 08/04/2016   IR DIALY SHUNT INTRO NEEDLE/INTRACATH INITIAL W/IMG LEFT 08/04/2016 Markus Daft, MD [facility]-INTERV RAD    . IR GENERIC HISTORICAL  08/04/2016   IR US GUIDE VASC ACCESS LEFT 08/04/2016 Markus Daft, MD [facility]  . JOINT REPLACEMENT     Total L-Hip replacement, Right Knee 10/20/09  . LEFT AND RIGHT HEART CATHETERIZATION WITH CORONARY ANGIOGRAM N/A 02/22/2013   Procedure: LEFT AND RIGHT HEART CATHETERIZATION WITH CORONARY ANGIOGRAM;  Surgeon: Jolaine Artist, MD;  Location: [facility] CATH LAB;  Service: Cardiovascular;  Laterality: N/A;  . LITHOTRIPSY  90's  . PERIPHERAL VASCULAR CATHETERIZATION Left 11/19/2015   Procedure: A/V/Fistulagram;  Surgeon: Conrad [place], MD;  Location: [place] CV LAB;  Service: Cardiovascular;  Laterality: Left;  . PERIPHERAL VASCULAR CATHETERIZATION Left 07/21/2016   Procedure: A/V Fistulagram;  Surgeon: Conrad Newberry, MD;  Location: [place] CV LAB;  Service: Cardiovascular;  Laterality: Left;  arm  . REVISON OF ARTERIOVENOUS FISTULA Left 05/30/2016   Procedure: REVISION LEFT UPPER ARM FISTULA;  Surgeon: Conrad Atkins, MD;  Location: [place];  Service: Vascular;  Laterality: Left;  . REVISON OF ARTERIOVENOUS FISTULA Left 07/22/2016   Procedure: REVISON OF BASILIC VEIN TRANSPOSITION ANASTOMOSIS;  Surgeon: Rosetta Posner, MD;  Location: [place];  Service: Vascular;  Laterality: Left;  . SPINE SURGERY     x 2  . TOTAL HIP ARTHROPLASTY Right 03/22/2016   Procedure: RIGHT TOTAL HIP ARTHROPLASTY ANTERIOR APPROACH;  Surgeon: Mcarthur Rossetti, MD;  Location: [place];  Service: Orthopedics;  Laterality: Right;    Family History  Problem Relation Age of Onset  . Hypertension Mother   . Arthritis Mother     ?RA  . Hypertension Father     Allergies  Allergen Reactions  . Ace Inhibitors Other (See Comments)    Worsening renal insufficiency    Current Outpatient Prescriptions on File Prior to Visit  Medication Sig Dispense Refill  . acetaminophen (TYLENOL) 500 MG tablet Take 500 mg by mouth every 6 (six) hours as needed for mild pain.    [name] albuterol (PROVENTIL) (2.5 MG/3ML) 0.083%  nebulizer solution Take 2.5 mg by nebulization every 4 (four) hours as needed for shortness of breath. Reported on 09/25/2015    . B Complex-C-Folic Acid (RENA-VITE RX) 1 MG TABS Take 1 tablet by mouth at bedtime.   6  . fluticasone (FLONASE) 50 MCG/ACT nasal spray Place 2 sprays into both nostrils daily. (Patient taking differently: Place 2 sprays into both nostrils daily as needed for allergies. ) 16 g 6  . hydroxychloroquine (PLAQUENIL) 200 MG tablet Take 200 mg by mouth 2 (two) times daily. Reported on 01/13/2016    . RENVELA 800 MG tablet Take 3 tablets (2,400 mg total) by mouth 3 (three) times daily with meals. 90 tablet 0  . warfarin (COUMADIN) 7.5 MG tablet Take 7.5 mg by mouth daily.  No current facility-administered medications on file prior to visit.     BP (!) 108/59 (BP Location: Right Arm, Cuff Size: Large)   Pulse (!) 53   Temp 98.7 F (37.1 C) (Oral)   Ht 6\' 5"  (1.956 m)   Wt 236 lb 3.2 oz (107.1 kg)   SpO2 100%   BMI 28.01 kg/m       Objective:   Physical Exam  Constitutional: He is oriented to person, place, and time. He appears well-developed and well-nourished. No distress.  HENT:  Head: Normocephalic and atraumatic.  Cardiovascular: Normal rate and regular rhythm.   No murmur heard. Pulmonary/Chest: Effort normal and breath sounds normal. No respiratory distress. He has no wheezes. He has no rales.  Musculoskeletal: He exhibits no edema.  Neurological: He is alert and oriented to person, place, and time.  Skin: Skin is warm and dry.  Psychiatric: He has a normal mood and affect. His behavior is normal. Thought content normal.          Assessment & Plan:  Hyperkalemia- resolved, management per renal- had labs drawn yesterday at [facility].    INR 3.1 today- advised pt to take 1/2 tab of coumadin tonight and then resume full tab once daily. Will ask coumadin clinic to arrange a 1 week follow up.

## 2016-08-17 NOTE — Addendum Note (Signed)
Addended by: Harl Bowie on: 08/17/2016 10:36 AM   Modules accepted: Orders

## 2016-08-17 NOTE — Progress Notes (Signed)
Pre visit review using our clinic review tool, if applicable. No additional management support is needed unless otherwise documented below in the visit note. 

## 2016-08-18 ENCOUNTER — Telehealth (HOSPITAL_COMMUNITY): Payer: Self-pay | Admitting: Vascular Surgery

## 2016-08-18 DIAGNOSIS — D631 Anemia in chronic kidney disease: Secondary | ICD-10-CM | POA: Diagnosis not present

## 2016-08-18 DIAGNOSIS — I4891 Unspecified atrial fibrillation: Secondary | ICD-10-CM | POA: Diagnosis not present

## 2016-08-18 DIAGNOSIS — N186 End stage renal disease: Secondary | ICD-10-CM | POA: Diagnosis not present

## 2016-08-18 DIAGNOSIS — D509 Iron deficiency anemia, unspecified: Secondary | ICD-10-CM | POA: Diagnosis not present

## 2016-08-18 DIAGNOSIS — Z23 Encounter for immunization: Secondary | ICD-10-CM | POA: Diagnosis not present

## 2016-08-18 DIAGNOSIS — N2581 Secondary hyperparathyroidism of renal origin: Secondary | ICD-10-CM | POA: Diagnosis not present

## 2016-08-18 DIAGNOSIS — E875 Hyperkalemia: Secondary | ICD-10-CM | POA: Diagnosis not present

## 2016-08-18 NOTE — Telephone Encounter (Signed)
Left pt message to make f/u appt

## 2016-08-20 DIAGNOSIS — D631 Anemia in chronic kidney disease: Secondary | ICD-10-CM | POA: Diagnosis not present

## 2016-08-20 DIAGNOSIS — D509 Iron deficiency anemia, unspecified: Secondary | ICD-10-CM | POA: Diagnosis not present

## 2016-08-20 DIAGNOSIS — E875 Hyperkalemia: Secondary | ICD-10-CM | POA: Diagnosis not present

## 2016-08-20 DIAGNOSIS — N2581 Secondary hyperparathyroidism of renal origin: Secondary | ICD-10-CM | POA: Diagnosis not present

## 2016-08-20 DIAGNOSIS — I4891 Unspecified atrial fibrillation: Secondary | ICD-10-CM | POA: Diagnosis not present

## 2016-08-20 DIAGNOSIS — N186 End stage renal disease: Secondary | ICD-10-CM | POA: Diagnosis not present

## 2016-08-23 DIAGNOSIS — E875 Hyperkalemia: Secondary | ICD-10-CM | POA: Diagnosis not present

## 2016-08-23 DIAGNOSIS — N186 End stage renal disease: Secondary | ICD-10-CM | POA: Diagnosis not present

## 2016-08-23 DIAGNOSIS — D509 Iron deficiency anemia, unspecified: Secondary | ICD-10-CM | POA: Diagnosis not present

## 2016-08-23 DIAGNOSIS — I4891 Unspecified atrial fibrillation: Secondary | ICD-10-CM | POA: Diagnosis not present

## 2016-08-23 DIAGNOSIS — D631 Anemia in chronic kidney disease: Secondary | ICD-10-CM | POA: Diagnosis not present

## 2016-08-23 DIAGNOSIS — N2581 Secondary hyperparathyroidism of renal origin: Secondary | ICD-10-CM | POA: Diagnosis not present

## 2016-08-24 ENCOUNTER — Other Ambulatory Visit: Payer: Self-pay | Admitting: Rheumatology

## 2016-08-24 ENCOUNTER — Ambulatory Visit (INDEPENDENT_AMBULATORY_CARE_PROVIDER_SITE_OTHER): Payer: Medicare Other | Admitting: *Deleted

## 2016-08-24 DIAGNOSIS — Z5181 Encounter for therapeutic drug level monitoring: Secondary | ICD-10-CM | POA: Diagnosis not present

## 2016-08-24 DIAGNOSIS — I4891 Unspecified atrial fibrillation: Secondary | ICD-10-CM

## 2016-08-24 LAB — POCT INR: INR: 4.1

## 2016-08-24 MED ORDER — HYDROXYCHLOROQUINE SULFATE 200 MG PO TABS: 200.0000 mg | ORAL_TABLET | Freq: Two times a day (BID) | ORAL | 0 refills | Status: DC

## 2016-08-24 NOTE — Telephone Encounter (Signed)
Last Visit: 07/23/15 Next Visit: 08/29/16 Labs: 08/01/16 HGB 11.6 PLQ Eye Exam: 07/23/15 WNL  Okay to refill 30 day supply of PLQ?

## 2016-08-24 NOTE — Telephone Encounter (Signed)
Patient needs a refill of PLQ sent to [facility] on [address]

## 2016-08-25 ENCOUNTER — Telehealth: Payer: Self-pay | Admitting: Radiology

## 2016-08-25 DIAGNOSIS — N2581 Secondary hyperparathyroidism of renal origin: Secondary | ICD-10-CM | POA: Diagnosis not present

## 2016-08-25 DIAGNOSIS — D509 Iron deficiency anemia, unspecified: Secondary | ICD-10-CM | POA: Diagnosis not present

## 2016-08-25 DIAGNOSIS — N186 End stage renal disease: Secondary | ICD-10-CM | POA: Diagnosis not present

## 2016-08-25 DIAGNOSIS — D631 Anemia in chronic kidney disease: Secondary | ICD-10-CM | POA: Diagnosis not present

## 2016-08-25 DIAGNOSIS — E875 Hyperkalemia: Secondary | ICD-10-CM | POA: Diagnosis not present

## 2016-08-25 DIAGNOSIS — I4891 Unspecified atrial fibrillation: Secondary | ICD-10-CM | POA: Diagnosis not present

## 2016-08-25 NOTE — Telephone Encounter (Signed)
Patient is on dialysis, you refilled his PLQ yesterday, in June, Dr Estanislado Pandy put this on hold until we get clearance from his nephrologist, we have had no word from nephrology. Please review and advise.   His Creat is 10.19

## 2016-08-25 NOTE — Progress Notes (Signed)
Office Visit Note  Patient: Bruce Cole             Date of Birth: Nov 05, 1940           MRN: [id number]             PCP: Nance Pear., NP Referring: Debbrah Alar, NP Visit Date: 08/29/2016 Occupation: '@GUAROCC'$ @    Subjective:  Follow-up Follow-up on rheumatoid arthritis, high risk prescription, gout, kidney disease, right nephrectomy  History of Present Illness: Bruce Cole is a 76 y.o. male  Last seen 07/21/2015. Patient reports that he's had multiple hospitalizations since the last visit and was unable to come to our office.  He states that he is doing relatively well with his rheumatoid arthritis. He is taking Plaquenil on a regular basis. He takes 200 mg twice a day and getting good relief. He is scheduled to get his Plaquenil eye exam done through [facility] this Wednesday, 08/31/2016 area His last Plaquenil eye exam was January 2017  Patient states that he's had his right kidney removed secondary to chronic kidney disease. His creatinine and GFR stay abnormal as a result of kidney damage. He is currently on dialysis.  He has no complaints at this time. His joints are doing well.  He's had labs done January 2018 but uric acid was not done so I'll draw that in the office today and patient is agreeable.  He has plenty of gout medicine at home (allopurinol).   Activities of Daily Living:  Patient reports morning stiffness for 15 minutes.   Patient Denies nocturnal pain.  Difficulty dressing/grooming: Denies Difficulty climbing stairs: Denies Difficulty getting out of chair: Denies Difficulty using hands for taps, buttons, cutlery, and/or writing: Denies   Review of Systems  Constitutional: Negative for fatigue.  HENT: Negative for mouth sores and mouth dryness.   Eyes: Negative for dryness.  Respiratory: Negative for shortness of breath.   Gastrointestinal: Negative for constipation and diarrhea.  Musculoskeletal: Negative for  myalgias and myalgias.  Skin: Negative for sensitivity to sunlight.  Neurological: Negative for memory loss.  Psychiatric/Behavioral: Negative for sleep disturbance.    PMFS History:  Patient Active Problem List   Diagnosis Date Noted  . Hyperkalemia 08/01/2016  . Acute hyperkalemia 07/16/2016  . Aftercare following surgery of the circulatory system 06/17/2016  . Status post total replacement of right hip 03/22/2016  . ESRD on dialysis ([place]) 11/27/2015  . Encounter for therapeutic drug monitoring 09/02/2013  . OSA (obstructive sleep apnea) 09/02/2013  . Chronic combined systolic and diastolic heart failure ([place]) 03/16/2013  . Allergic rhinitis 12/18/2012  . Long term current use of anticoagulant therapy 11/25/2010  . Genital herpes 05/31/2010  . DM (diabetes mellitus), type 2 with renal complications ([place]) 54/65/0354  . ERECTILE DYSFUNCTION, ORGANIC 09/21/2009  . Osteoarthritis 09/21/2009  . PERSONAL HX COLONIC POLYPS 08/25/2009  . Gout 07/22/2009  . Essential hypertension 07/22/2009  . ATRIAL FIBRILLATION 07/22/2009  . Rheumatoid arthritis ([place]) 07/22/2009  . NEPHROLITHIASIS, HX OF 07/22/2009    Past Medical History:  Diagnosis Date  . Acute blood loss anemia   . Atrial fibrillation (HCC)    Chronic  . Chronic combined systolic and diastolic heart failure ([place])   . Colon polyp 2000  . Dysrhythmia    hx  . ESRD on hemodialysis ([place])    [place]  . Essential hypertension   . Gastritis and gastroduodenitis   . GI bleed   . Gout   . Heart  murmur   . History of blood transfusion   . History of kidney stones   . Nephrolithiasis   . OSA (obstructive sleep apnea) 09/02/2013    IMPRESSION :  1. Mild obstructive sleep apnea with hypopneas causing sleep fragmentation and moderate oxygen desaturation.  2. Short runs of nonsustained VT were noted. His beta blocker may need to be titrated 3. Significant PLM's were noted, the PLM arousal index was low. Please  correlate with clinical history of restless leg syndrome.  4. Sleep efficiency was poor.  RECOMMENDATION:  1. Treatment options for this degree of sleep disordered breathing include weight loss and positional therapy to avoid supine sleep 2. Consider titrating beta blocker further, defer to cardiologist 3. Patient should be cautioned against driving when sleepy.They should be asked to avoid medications with sedative side effects     . Osteoarthritis of right hip   . Pneumonia    hx 30 yrs ago  . Primary osteoarthritis of right hip   . Rheumatoid arthritis ([place])   . Thrombocytopenia ([place])     Family History  Problem Relation Age of Onset  . Hypertension Mother   . Arthritis Mother     ?RA  . Hypertension Father    Past Surgical History:  Procedure Laterality Date  . AV FISTULA PLACEMENT Left 08/26/2015   Procedure: LEFT RADIOCEPHALIC FISTULA CREATION;  Surgeon: Rosetta Posner, MD;  Location: [facility];  Service: Vascular;  Laterality: Left;  . AV FISTULA PLACEMENT Left 11/23/2015   Procedure: ARTERIOVENOUS (AV) FISTULA CREATION;  Surgeon: Rosetta Posner, MD;  Location: [facility];  Service: Vascular;  Laterality: Left;  . BACK SURGERY     x2- discectomy  . CHOLECYSTECTOMY  1994  . CYSTOSCOPY/RETROGRADE/URETEROSCOPY/STONE EXTRACTION WITH BASKET    . ESOPHAGOGASTRODUODENOSCOPY N/A 11/13/2015   Procedure: ESOPHAGOGASTRODUODENOSCOPY (EGD);  Surgeon: Irene Shipper, MD;  Location: [facility] ENDOSCOPY;  Service: Endoscopy;  Laterality: N/A;  . INSERTION OF DIALYSIS CATHETER Left 11/23/2015   Procedure: INSERTION OF DIALYSIS CATHETER;  Surgeon: Rosetta Posner, MD;  Location: [facility];  Service: Vascular;  Laterality: Left;  . IR GENERIC HISTORICAL Left 08/04/2016   IR DIALY SHUNT INTRO NEEDLE/INTRACATH INITIAL W/IMG LEFT 08/04/2016 Markus Daft, MD [facility]  . IR GENERIC HISTORICAL  08/04/2016   IR US GUIDE VASC ACCESS LEFT 08/04/2016 Markus Daft, MD [facility]  . JOINT REPLACEMENT     Total L-Hip replacement, Right Knee  10/20/09  . LEFT AND RIGHT HEART CATHETERIZATION WITH CORONARY ANGIOGRAM N/A 02/22/2013   Procedure: LEFT AND RIGHT HEART CATHETERIZATION WITH CORONARY ANGIOGRAM;  Surgeon: Jolaine Artist, MD;  Location: [facility] CATH LAB;  Service: Cardiovascular;  Laterality: N/A;  . LITHOTRIPSY  90's  . PERIPHERAL VASCULAR CATHETERIZATION Left 11/19/2015   Procedure: A/V/Fistulagram;  Surgeon: Conrad Gem, MD;  Location: [facility] CV LAB;  Service: Cardiovascular;  Laterality: Left;  . PERIPHERAL VASCULAR CATHETERIZATION Left 07/21/2016   Procedure: A/V Fistulagram;  Surgeon: Conrad Devers, MD;  Location: [facility] CV LAB;  Service: Cardiovascular;  Laterality: Left;  arm  . REVISON OF ARTERIOVENOUS FISTULA Left 05/30/2016   Procedure: REVISION LEFT UPPER ARM FISTULA;  Surgeon: Conrad Bellevue, MD;  Location: [facility];  Service: Vascular;  Laterality: Left;  . REVISON OF ARTERIOVENOUS FISTULA Left 07/22/2016   Procedure: REVISON OF BASILIC VEIN TRANSPOSITION ANASTOMOSIS;  Surgeon: Rosetta Posner, MD;  Location: [facility];  Service: Vascular;  Laterality: Left;  . SPINE SURGERY     x 2  .  TOTAL HIP ARTHROPLASTY Right 03/22/2016   Procedure: RIGHT TOTAL HIP ARTHROPLASTY ANTERIOR APPROACH;  Surgeon: Mcarthur Rossetti, MD;  Location: [facility];  Service: Orthopedics;  Laterality: Right;   Social History   Social History Narrative   Former Clinical biochemist and [place]   Admitted to [facility] 11/25/15   Widowed   Never smoked   FULL CODE     Objective: Vital Signs: BP 136/62   Pulse 78   Resp 14   Ht '6\' 3"'$  (1.905 m)   Wt 246 lb (111.6 kg)   BMI 30.75 kg/m    Physical Exam  Constitutional: He is oriented to person, place, and time. He appears well-developed and well-nourished.  HENT:  Head: Normocephalic and atraumatic.  Eyes: Conjunctivae and EOM are normal. Pupils are equal, round, and reactive to light.  Neck: Normal range of motion. Neck supple.  Cardiovascular: Normal rate, regular rhythm and normal  heart sounds.  Exam reveals no gallop and no friction rub.   No murmur heard. Pulmonary/Chest: Effort normal and breath sounds normal. No respiratory distress. He has no wheezes. He has no rales. He exhibits no tenderness.  Abdominal: Soft. He exhibits no distension and no mass. There is no tenderness. There is no guarding.  Musculoskeletal: Normal range of motion.  Lymphadenopathy:    He has no cervical adenopathy.  Neurological: He is alert and oriented to person, place, and time. He exhibits normal muscle tone. Coordination normal.  Skin: Skin is warm and dry. Capillary refill takes less than 2 seconds. No rash noted.  Psychiatric: He has a normal mood and affect. His behavior is normal. Judgment and thought content normal.  Nursing note and vitals reviewed.    Musculoskeletal Exam:  Full range of motion of all joints Grip strength is equal and strong bilaterally Fibromyalgia tender points are all absent Patient recently had a right hip replacement with Dr. Ninfa Linden.  CDAI Exam: CDAI Homunculus Exam:   Joint Counts:  CDAI Tender Joint count: 0 CDAI Swollen Joint count: 0  Global Assessments:  Patient Global Assessment: 1 Provider Global Assessment: 1  CDAI Calculated Score: 2    Investigation: Findings:  labs from 06-2009  showed CBC with diff normal, glucose 172, creatinine normal, urinalysis normal, RF was elevated at 18.8, HLA-B27 was negative, vitamin D was normal, ACE normal, CCP negative, TSH normal, uric acid was 7.4, sed rate was normal, ANA was negative, CK was elevated at 510.    08/19/2009 x-ray of patient's hands that showed left third MCP narrowing and PIP narrowing. I obtained an x-ray of the feet bilaterally that showed left foot first MTP erosion as well within the first MTP joint. Right showed second MTP erosion.  I obtained x-ray of his lumbar spine that showed L4-L5, L5-L6 severe narrowing and facet joint arthropathy.  Pelvis and SI joint was normal. Left hip  joint had been replaced. Right showed severe narrowing.  I reviewed x-rays of knees that had been done previously that showed right severe medial compartment narrowing, left moderate medial compartment narrowing.    07/24/2015 eye exam shows no plaquenil toxicity    Anti-coag visit on 08/24/2016  Component Date Value Ref Range Status  . INR 08/24/2016 4.1   Final  Office Visit on 08/17/2016  Component Date Value Ref Range Status  . INR 08/17/2016 3.1   Final  Scanned Document on 08/11/2016  Component Date Value Ref Range Status  . INR 08/11/2016 2.5* 0.9 - 1.1 Final  Admission  on 08/01/2016, Discharged on 08/05/2016  Component Date Value Ref Range Status  . WBC 08/01/2016 7.0  4.0 - 10.5 K/uL Final  . RBC 08/01/2016 3.89* 4.22 - 5.81 MIL/uL Final  . Hemoglobin 08/01/2016 11.6* 13.0 - 17.0 g/dL Final  . HCT 08/01/2016 37.3* 39.0 - 52.0 % Final  . MCV 08/01/2016 95.9  78.0 - 100.0 fL Final  . MCH 08/01/2016 29.8  26.0 - 34.0 pg Final  . MCHC 08/01/2016 31.1  30.0 - 36.0 g/dL Final  . RDW 08/01/2016 15.3  11.5 - 15.5 % Final  . Platelets 08/01/2016 223  150 - 400 K/uL Final  . Neutrophils Relative % 08/01/2016 70  % Final  . Neutro Abs 08/01/2016 4.9  1.7 - 7.7 K/uL Final  . Lymphocytes Relative 08/01/2016 22  % Final  . Lymphs Abs 08/01/2016 1.5  0.7 - 4.0 K/uL Final  . Monocytes Relative 08/01/2016 7  % Final  . Monocytes Absolute 08/01/2016 0.5  0.1 - 1.0 K/uL Final  . Eosinophils Relative 08/01/2016 1  % Final  . Eosinophils Absolute 08/01/2016 0.1  0.0 - 0.7 K/uL Final  . Basophils Relative 08/01/2016 0  % Final  . Basophils Absolute 08/01/2016 0.0  0.0 - 0.1 K/uL Final  . Sodium 08/01/2016 137  135 - 145 mmol/L Final  . Potassium 08/01/2016 8.7* 3.5 - 5.1 mmol/L Final   Comment: REPEATED TO VERIFY NO VISIBLE HEMOLYSIS JOSS BENTON RN '@1120'$  08/01/2016 OLSONM   . Chloride 08/01/2016 102  101 - 111 mmol/L Final  . CO2 08/01/2016 24  22 - 32 mmol/L Final  . Glucose, Bld  08/01/2016 117* 65 - 99 mg/dL Final  . BUN 08/01/2016 57* 6 - 20 mg/dL Final  . Creatinine, Ser 08/01/2016 9.30* 0.61 - 1.24 mg/dL Final  . Calcium 08/01/2016 10.1  8.9 - 10.3 mg/dL Final  . Total Protein 08/01/2016 8.6* 6.5 - 8.1 g/dL Final  . Albumin 08/01/2016 4.2  3.5 - 5.0 g/dL Final  . AST 08/01/2016 41  15 - 41 U/L Final  . ALT 08/01/2016 18  17 - 63 U/L Final  . Alkaline Phosphatase 08/01/2016 152* 38 - 126 U/L Final  . Total Bilirubin 08/01/2016 0.4  0.3 - 1.2 mg/dL Final  . GFR calc non Af Amer 08/01/2016 5* >60 mL/min Final  . GFR calc Af Amer 08/01/2016 6* >60 mL/min Final   Comment: (NOTE) The eGFR has been calculated using the CKD EPI equation. This calculation has not been validated in all clinical situations. eGFR's persistently <60 mL/min signify possible Chronic Kidney Disease.   . Anion gap 08/01/2016 11  5 - 15 Final  . Prothrombin Time 08/01/2016 23.9* 11.4 - 15.2 seconds Final  . INR 08/01/2016 2.10   Final  . Sodium 08/01/2016 139  135 - 145 mmol/L Final  . Potassium 08/01/2016 7.1* 3.5 - 5.1 mmol/L Final   Comment: NO VISIBLE HEMOLYSIS CRITICAL RESULT CALLED TO, READ BACK BY AND VERIFIED WITH: Leandro Reasoner RN [phone] GREEN R   . Chloride 08/01/2016 100* 101 - 111 mmol/L Final  . CO2 08/01/2016 26  22 - 32 mmol/L Final  . Glucose, Bld 08/01/2016 99  65 - 99 mg/dL Final  . BUN 08/01/2016 54* 6 - 20 mg/dL Final  . Creatinine, Ser 08/01/2016 9.85* 0.61 - 1.24 mg/dL Final  . Calcium 08/01/2016 10.0  8.9 - 10.3 mg/dL Final  . GFR calc non Af Amer 08/01/2016 5* >60 mL/min Final  . GFR calc Af Wyvonnia Lora 08/01/2016  5* >60 mL/min Final   Comment: (NOTE) The eGFR has been calculated using the CKD EPI equation. This calculation has not been validated in all clinical situations. eGFR's persistently <60 mL/min signify possible Chronic Kidney Disease.   . Anion gap 08/01/2016 13  5 - 15 Final  . Sodium 08/02/2016 137  135 - 145 mmol/L Final  . Potassium 08/02/2016 4.3   3.5 - 5.1 mmol/L Final  . Chloride 08/02/2016 96* 101 - 111 mmol/L Final  . CO2 08/02/2016 29  22 - 32 mmol/L Final  . Glucose, Bld 08/02/2016 140* 65 - 99 mg/dL Final  . BUN 08/02/2016 27* 6 - 20 mg/dL Final  . Creatinine, Ser 08/02/2016 6.58* 0.61 - 1.24 mg/dL Final  . Calcium 08/02/2016 9.2  8.9 - 10.3 mg/dL Final  . Total Protein 08/02/2016 7.6  6.5 - 8.1 g/dL Final  . Albumin 08/02/2016 3.6  3.5 - 5.0 g/dL Final  . AST 08/02/2016 37  15 - 41 U/L Final  . ALT 08/02/2016 18  17 - 63 U/L Final  . Alkaline Phosphatase 08/02/2016 144* 38 - 126 U/L Final  . Total Bilirubin 08/02/2016 0.5  0.3 - 1.2 mg/dL Final  . GFR calc non Af Amer 08/02/2016 7* >60 mL/min Final  . GFR calc Af Amer 08/02/2016 9* >60 mL/min Final   Comment: (NOTE) The eGFR has been calculated using the CKD EPI equation. This calculation has not been validated in all clinical situations. eGFR's persistently <60 mL/min signify possible Chronic Kidney Disease.   . Anion gap 08/02/2016 12  5 - 15 Final  . Phosphorus 08/02/2016 5.0* 2.5 - 4.6 mg/dL Final  . Lactic Acid, Venous 08/01/2016 1.3  0.5 - 1.9 mmol/L Final  . Prothrombin Time 08/02/2016 25.0* 11.4 - 15.2 seconds Final  . INR 08/02/2016 2.22   Final  . Potassium 08/01/2016 3.6  3.5 - 5.1 mmol/L Final  . Glucose-Capillary 08/02/2016 97  65 - 99 mg/dL Final  . Magnesium 08/02/2016 2.2  1.7 - 2.4 mg/dL Final  . TSH 08/02/2016 1.412  0.350 - 4.500 uIU/mL Final  . WBC 08/02/2016 6.2  4.0 - 10.5 K/uL Final  . RBC 08/02/2016 3.86* 4.22 - 5.81 MIL/uL Final  . Hemoglobin 08/02/2016 11.6* 13.0 - 17.0 g/dL Final  . HCT 08/02/2016 36.5* 39.0 - 52.0 % Final  . MCV 08/02/2016 94.6  78.0 - 100.0 fL Final  . MCH 08/02/2016 30.1  26.0 - 34.0 pg Final  . MCHC 08/02/2016 31.8  30.0 - 36.0 g/dL Final  . RDW 08/02/2016 15.6* 11.5 - 15.5 % Final  . Platelets 08/02/2016 212  150 - 400 K/uL Final  . C Diff antigen 08/02/2016 NEGATIVE  NEGATIVE Final  . C Diff toxin 08/02/2016  NEGATIVE  NEGATIVE Final  . C Diff interpretation 08/02/2016 No C. difficile detected.   Final  . Campylobacter species 08/02/2016 NOT DETECTED  NOT DETECTED Final  . Plesimonas shigelloides 08/02/2016 NOT DETECTED  NOT DETECTED Final  . Salmonella species 08/02/2016 NOT DETECTED  NOT DETECTED Final  . Yersinia enterocolitica 08/02/2016 NOT DETECTED  NOT DETECTED Final  . Vibrio species 08/02/2016 NOT DETECTED  NOT DETECTED Final  . Vibrio cholerae 08/02/2016 NOT DETECTED  NOT DETECTED Final  . Enteroaggregative E coli (EAEC) 08/02/2016 NOT DETECTED  NOT DETECTED Final  . Enteropathogenic E coli (EPEC) 08/02/2016 NOT DETECTED  NOT DETECTED Final  . Enterotoxigenic E coli (ETEC) 08/02/2016 NOT DETECTED  NOT DETECTED Final  . Shiga like toxin producing E coli *  08/02/2016 NOT DETECTED  NOT DETECTED Final  . Shigella/Enteroinvasive E coli (EI* 08/02/2016 NOT DETECTED  NOT DETECTED Final  . Cryptosporidium 08/02/2016 NOT DETECTED  NOT DETECTED Final  . Cyclospora cayetanensis 08/02/2016 NOT DETECTED  NOT DETECTED Final  . Entamoeba histolytica 08/02/2016 NOT DETECTED  NOT DETECTED Final  . Giardia lamblia 08/02/2016 NOT DETECTED  NOT DETECTED Final  . Adenovirus F40/41 08/02/2016 NOT DETECTED  NOT DETECTED Final  . Astrovirus 08/02/2016 NOT DETECTED  NOT DETECTED Final  . Norovirus GI/GII 08/02/2016 NOT DETECTED  NOT DETECTED Final  . Rotavirus A 08/02/2016 NOT DETECTED  NOT DETECTED Final  . Sapovirus (I, II, IV, and V) 08/02/2016 NOT DETECTED  NOT DETECTED Final  . Hgb A1c MFr Bld 08/02/2016 6.1* 4.8 - 5.6 % Final   Comment: (NOTE)         Pre-diabetes: 5.7 - 6.4         Diabetes: >6.4         Glycemic control for adults with diabetes: <7.0   . Mean Plasma Glucose 08/02/2016 128  mg/dL Final   Comment: (NOTE) Performed At: [facility] [place], [place] [id number] Lindon Romp MD WU:[id number]   . Glucose-Capillary 08/02/2016 91  65 - 99 mg/dL Final  . Total  CK 08/02/2016 90  49 - 397 U/L Final  . Glucose-Capillary 08/02/2016 121* 65 - 99 mg/dL Final  . Prothrombin Time 08/03/2016 28.6* 11.4 - 15.2 seconds Final  . INR 08/03/2016 2.63   Final  . Sodium 08/03/2016 137  135 - 145 mmol/L Final  . Potassium 08/03/2016 5.4* 3.5 - 5.1 mmol/L Final  . Chloride 08/03/2016 96* 101 - 111 mmol/L Final  . CO2 08/03/2016 24  22 - 32 mmol/L Final  . Glucose, Bld 08/03/2016 91  65 - 99 mg/dL Final  . BUN 08/03/2016 39* 6 - 20 mg/dL Final  . Creatinine, Ser 08/03/2016 9.18* 0.61 - 1.24 mg/dL Final  . Calcium 08/03/2016 9.5  8.9 - 10.3 mg/dL Final  . Phosphorus 08/03/2016 7.1* 2.5 - 4.6 mg/dL Final  . Albumin 08/03/2016 3.7  3.5 - 5.0 g/dL Final  . GFR calc non Af Amer 08/03/2016 5* >60 mL/min Final  . GFR calc Af Amer 08/03/2016 6* >60 mL/min Final   Comment: (NOTE) The eGFR has been calculated using the CKD EPI equation. This calculation has not been validated in all clinical situations. eGFR's persistently <60 mL/min signify possible Chronic Kidney Disease.   . Anion gap 08/03/2016 17* 5 - 15 Final  . Prothrombin Time 08/04/2016 27.7* 11.4 - 15.2 seconds Final  . INR 08/04/2016 2.52   Final  . Sodium 08/04/2016 136  135 - 145 mmol/L Final  . Potassium 08/04/2016 4.9  3.5 - 5.1 mmol/L Final  . Chloride 08/04/2016 96* 101 - 111 mmol/L Final  . CO2 08/04/2016 29  22 - 32 mmol/L Final  . Glucose, Bld 08/04/2016 96  65 - 99 mg/dL Final  . BUN 08/04/2016 24* 6 - 20 mg/dL Final  . Creatinine, Ser 08/04/2016 6.63* 0.61 - 1.24 mg/dL Final  . Calcium 08/04/2016 9.3  8.9 - 10.3 mg/dL Final  . Phosphorus 08/04/2016 5.4* 2.5 - 4.6 mg/dL Final  . Albumin 08/04/2016 3.5  3.5 - 5.0 g/dL Final  . GFR calc non Af Amer 08/04/2016 7* >60 mL/min Final  . GFR calc Af Amer 08/04/2016 9* >60 mL/min Final   Comment: (NOTE) The eGFR has been calculated using the CKD EPI equation. This calculation  has not been validated in all clinical situations. eGFR's persistently <60  mL/min signify possible Chronic Kidney Disease.   . Anion gap 08/04/2016 11  5 - 15 Final  . Prothrombin Time 08/05/2016 28.2* 11.4 - 15.2 seconds Final  . INR 08/05/2016 2.58   Final  . WBC 08/05/2016 6.7  4.0 - 10.5 K/uL Final  . RBC 08/05/2016 3.80* 4.22 - 5.81 MIL/uL Final  . Hemoglobin 08/05/2016 11.2* 13.0 - 17.0 g/dL Final  . HCT 08/05/2016 35.1* 39.0 - 52.0 % Final  . MCV 08/05/2016 92.4  78.0 - 100.0 fL Final  . MCH 08/05/2016 29.5  26.0 - 34.0 pg Final  . MCHC 08/05/2016 31.9  30.0 - 36.0 g/dL Final  . RDW 08/05/2016 15.4  11.5 - 15.5 % Final  . Platelets 08/05/2016 206  150 - 400 K/uL Final  . Sodium 08/05/2016 135  135 - 145 mmol/L Final  . Potassium 08/05/2016 4.8  3.5 - 5.1 mmol/L Final  . Chloride 08/05/2016 95* 101 - 111 mmol/L Final  . CO2 08/05/2016 27  22 - 32 mmol/L Final  . Glucose, Bld 08/05/2016 105* 65 - 99 mg/dL Final  . BUN 08/05/2016 42* 6 - 20 mg/dL Final  . Creatinine, Ser 08/05/2016 9.52* 0.61 - 1.24 mg/dL Final  . Calcium 08/05/2016 9.3  8.9 - 10.3 mg/dL Final  . Phosphorus 08/05/2016 6.3* 2.5 - 4.6 mg/dL Final  . Albumin 08/05/2016 3.5  3.5 - 5.0 g/dL Final  . GFR calc non Af Amer 08/05/2016 5* >60 mL/min Final  . GFR calc Af Amer 08/05/2016 6* >60 mL/min Final   Comment: (NOTE) The eGFR has been calculated using the CKD EPI equation. This calculation has not been validated in all clinical situations. eGFR's persistently <60 mL/min signify possible Chronic Kidney Disease.   . Anion gap 08/05/2016 13  5 - 15 Final  Anti-coag visit on 07/27/2016  Component Date Value Ref Range Status  . INR 07/27/2016 1.4   Final  Admission on 07/16/2016, Discharged on 07/23/2016  No results displayed because visit has over 200 results.    Anti-coag visit on 07/13/2016  Component Date Value Ref Range Status  . INR 07/13/2016 1.7   Final  Office Visit on 06/24/2016  Component Date Value Ref Range Status  . Hgb A1c MFr Bld 06/24/2016 5.5  4.6 - 6.5 % Final    Anti-coag visit on 06/13/2016  Component Date Value Ref Range Status  . INR 06/13/2016 2.1   Final  Scanned Document on 06/02/2016  Component Date Value Ref Range Status  . INR 06/02/2016 1.2* 0.9 - 1.1 Final  There may be more visits with results that are not included.     Anti-coag visit on 08/24/2016  Component Date Value Ref Range Status  . INR 08/24/2016 4.1   Final  Office Visit on 08/17/2016  Component Date Value Ref Range Status  . INR 08/17/2016 3.1   Final  Scanned Document on 08/11/2016  Component Date Value Ref Range Status  . INR 08/11/2016 2.5* 0.9 - 1.1 Final  Admission on 08/01/2016, Discharged on 08/05/2016  Component Date Value Ref Range Status  . WBC 08/01/2016 7.0  4.0 - 10.5 K/uL Final  . RBC 08/01/2016 3.89* 4.22 - 5.81 MIL/uL Final  . Hemoglobin 08/01/2016 11.6* 13.0 - 17.0 g/dL Final  . HCT 08/01/2016 37.3* 39.0 - 52.0 % Final  . MCV 08/01/2016 95.9  78.0 - 100.0 fL Final  . MCH 08/01/2016 29.8  26.0 - 34.0 pg  Final  . MCHC 08/01/2016 31.1  30.0 - 36.0 g/dL Final  . RDW 08/01/2016 15.3  11.5 - 15.5 % Final  . Platelets 08/01/2016 223  150 - 400 K/uL Final  . Neutrophils Relative % 08/01/2016 70  % Final  . Neutro Abs 08/01/2016 4.9  1.7 - 7.7 K/uL Final  . Lymphocytes Relative 08/01/2016 22  % Final  . Lymphs Abs 08/01/2016 1.5  0.7 - 4.0 K/uL Final  . Monocytes Relative 08/01/2016 7  % Final  . Monocytes Absolute 08/01/2016 0.5  0.1 - 1.0 K/uL Final  . Eosinophils Relative 08/01/2016 1  % Final  . Eosinophils Absolute 08/01/2016 0.1  0.0 - 0.7 K/uL Final  . Basophils Relative 08/01/2016 0  % Final  . Basophils Absolute 08/01/2016 0.0  0.0 - 0.1 K/uL Final  . Sodium 08/01/2016 137  135 - 145 mmol/L Final  . Potassium 08/01/2016 8.7* 3.5 - 5.1 mmol/L Final   Comment: REPEATED TO VERIFY NO VISIBLE HEMOLYSIS JOSS BENTON RN '@1120'$  08/01/2016 OLSONM   . Chloride 08/01/2016 102  101 - 111 mmol/L Final  . CO2 08/01/2016 24  22 - 32 mmol/L Final  .  Glucose, Bld 08/01/2016 117* 65 - 99 mg/dL Final  . BUN 08/01/2016 57* 6 - 20 mg/dL Final  . Creatinine, Ser 08/01/2016 9.30* 0.61 - 1.24 mg/dL Final  . Calcium 08/01/2016 10.1  8.9 - 10.3 mg/dL Final  . Total Protein 08/01/2016 8.6* 6.5 - 8.1 g/dL Final  . Albumin 08/01/2016 4.2  3.5 - 5.0 g/dL Final  . AST 08/01/2016 41  15 - 41 U/L Final  . ALT 08/01/2016 18  17 - 63 U/L Final  . Alkaline Phosphatase 08/01/2016 152* 38 - 126 U/L Final  . Total Bilirubin 08/01/2016 0.4  0.3 - 1.2 mg/dL Final  . GFR calc non Af Amer 08/01/2016 5* >60 mL/min Final  . GFR calc Af Amer 08/01/2016 6* >60 mL/min Final   Comment: (NOTE) The eGFR has been calculated using the CKD EPI equation. This calculation has not been validated in all clinical situations. eGFR's persistently <60 mL/min signify possible Chronic Kidney Disease.   . Anion gap 08/01/2016 11  5 - 15 Final  . Prothrombin Time 08/01/2016 23.9* 11.4 - 15.2 seconds Final  . INR 08/01/2016 2.10   Final  . Sodium 08/01/2016 139  135 - 145 mmol/L Final  . Potassium 08/01/2016 7.1* 3.5 - 5.1 mmol/L Final   Comment: NO VISIBLE HEMOLYSIS CRITICAL RESULT CALLED TO, READ BACK BY AND VERIFIED WITH: Leandro Reasoner RN [phone] 1958 GREEN R   . Chloride 08/01/2016 100* 101 - 111 mmol/L Final  . CO2 08/01/2016 26  22 - 32 mmol/L Final  . Glucose, Bld 08/01/2016 99  65 - 99 mg/dL Final  . BUN 08/01/2016 54* 6 - 20 mg/dL Final  . Creatinine, Ser 08/01/2016 9.85* 0.61 - 1.24 mg/dL Final  . Calcium 08/01/2016 10.0  8.9 - 10.3 mg/dL Final  . GFR calc non Af Amer 08/01/2016 5* >60 mL/min Final  . GFR calc Af Amer 08/01/2016 5* >60 mL/min Final   Comment: (NOTE) The eGFR has been calculated using the CKD EPI equation. This calculation has not been validated in all clinical situations. eGFR's persistently <60 mL/min signify possible Chronic Kidney Disease.   . Anion gap 08/01/2016 13  5 - 15 Final  . Sodium 08/02/2016 137  135 - 145 mmol/L Final  . Potassium  08/02/2016 4.3  3.5 - 5.1 mmol/L Final  .  Chloride 08/02/2016 96* 101 - 111 mmol/L Final  . CO2 08/02/2016 29  22 - 32 mmol/L Final  . Glucose, Bld 08/02/2016 140* 65 - 99 mg/dL Final  . BUN 08/02/2016 27* 6 - 20 mg/dL Final  . Creatinine, Ser 08/02/2016 6.58* 0.61 - 1.24 mg/dL Final  . Calcium 08/02/2016 9.2  8.9 - 10.3 mg/dL Final  . Total Protein 08/02/2016 7.6  6.5 - 8.1 g/dL Final  . Albumin 08/02/2016 3.6  3.5 - 5.0 g/dL Final  . AST 08/02/2016 37  15 - 41 U/L Final  . ALT 08/02/2016 18  17 - 63 U/L Final  . Alkaline Phosphatase 08/02/2016 144* 38 - 126 U/L Final  . Total Bilirubin 08/02/2016 0.5  0.3 - 1.2 mg/dL Final  . GFR calc non Af Amer 08/02/2016 7* >60 mL/min Final  . GFR calc Af Amer 08/02/2016 9* >60 mL/min Final   Comment: (NOTE) The eGFR has been calculated using the CKD EPI equation. This calculation has not been validated in all clinical situations. eGFR's persistently <60 mL/min signify possible Chronic Kidney Disease.   . Anion gap 08/02/2016 12  5 - 15 Final  . Phosphorus 08/02/2016 5.0* 2.5 - 4.6 mg/dL Final  . Lactic Acid, Venous 08/01/2016 1.3  0.5 - 1.9 mmol/L Final  . Prothrombin Time 08/02/2016 25.0* 11.4 - 15.2 seconds Final  . INR 08/02/2016 2.22   Final  . Potassium 08/01/2016 3.6  3.5 - 5.1 mmol/L Final  . Glucose-Capillary 08/02/2016 97  65 - 99 mg/dL Final  . Magnesium 08/02/2016 2.2  1.7 - 2.4 mg/dL Final  . TSH 08/02/2016 1.412  0.350 - 4.500 uIU/mL Final  . WBC 08/02/2016 6.2  4.0 - 10.5 K/uL Final  . RBC 08/02/2016 3.86* 4.22 - 5.81 MIL/uL Final  . Hemoglobin 08/02/2016 11.6* 13.0 - 17.0 g/dL Final  . HCT 08/02/2016 36.5* 39.0 - 52.0 % Final  . MCV 08/02/2016 94.6  78.0 - 100.0 fL Final  . MCH 08/02/2016 30.1  26.0 - 34.0 pg Final  . MCHC 08/02/2016 31.8  30.0 - 36.0 g/dL Final  . RDW 08/02/2016 15.6* 11.5 - 15.5 % Final  . Platelets 08/02/2016 212  150 - 400 K/uL Final  . C Diff antigen 08/02/2016 NEGATIVE  NEGATIVE Final  . C Diff toxin  08/02/2016 NEGATIVE  NEGATIVE Final  . C Diff interpretation 08/02/2016 No C. difficile detected.   Final  . Campylobacter species 08/02/2016 NOT DETECTED  NOT DETECTED Final  . Plesimonas shigelloides 08/02/2016 NOT DETECTED  NOT DETECTED Final  . Salmonella species 08/02/2016 NOT DETECTED  NOT DETECTED Final  . Yersinia enterocolitica 08/02/2016 NOT DETECTED  NOT DETECTED Final  . Vibrio species 08/02/2016 NOT DETECTED  NOT DETECTED Final  . Vibrio cholerae 08/02/2016 NOT DETECTED  NOT DETECTED Final  . Enteroaggregative E coli (EAEC) 08/02/2016 NOT DETECTED  NOT DETECTED Final  . Enteropathogenic E coli (EPEC) 08/02/2016 NOT DETECTED  NOT DETECTED Final  . Enterotoxigenic E coli (ETEC) 08/02/2016 NOT DETECTED  NOT DETECTED Final  . Shiga like toxin producing E coli * 08/02/2016 NOT DETECTED  NOT DETECTED Final  . Shigella/Enteroinvasive E coli (EI* 08/02/2016 NOT DETECTED  NOT DETECTED Final  . Cryptosporidium 08/02/2016 NOT DETECTED  NOT DETECTED Final  . Cyclospora cayetanensis 08/02/2016 NOT DETECTED  NOT DETECTED Final  . Entamoeba histolytica 08/02/2016 NOT DETECTED  NOT DETECTED Final  . Giardia lamblia 08/02/2016 NOT DETECTED  NOT DETECTED Final  . Adenovirus F40/41 08/02/2016 NOT DETECTED  NOT DETECTED  Final  . Astrovirus 08/02/2016 NOT DETECTED  NOT DETECTED Final  . Norovirus GI/GII 08/02/2016 NOT DETECTED  NOT DETECTED Final  . Rotavirus A 08/02/2016 NOT DETECTED  NOT DETECTED Final  . Sapovirus (I, II, IV, and V) 08/02/2016 NOT DETECTED  NOT DETECTED Final  . Hgb A1c MFr Bld 08/02/2016 6.1* 4.8 - 5.6 % Final   Comment: (NOTE)         Pre-diabetes: 5.7 - 6.4         Diabetes: >6.4         Glycemic control for adults with diabetes: <7.0   . Mean Plasma Glucose 08/02/2016 128  mg/dL Final   Comment: (NOTE) Performed At: [facility] [place], [place] [id number] Lindon Romp MD GH:[id number]   . Glucose-Capillary 08/02/2016 91  65 - 99 mg/dL  Final  . Total CK 08/02/2016 90  49 - 397 U/L Final  . Glucose-Capillary 08/02/2016 121* 65 - 99 mg/dL Final  . Prothrombin Time 08/03/2016 28.6* 11.4 - 15.2 seconds Final  . INR 08/03/2016 2.63   Final  . Sodium 08/03/2016 137  135 - 145 mmol/L Final  . Potassium 08/03/2016 5.4* 3.5 - 5.1 mmol/L Final  . Chloride 08/03/2016 96* 101 - 111 mmol/L Final  . CO2 08/03/2016 24  22 - 32 mmol/L Final  . Glucose, Bld 08/03/2016 91  65 - 99 mg/dL Final  . BUN 08/03/2016 39* 6 - 20 mg/dL Final  . Creatinine, Ser 08/03/2016 9.18* 0.61 - 1.24 mg/dL Final  . Calcium 08/03/2016 9.5  8.9 - 10.3 mg/dL Final  . Phosphorus 08/03/2016 7.1* 2.5 - 4.6 mg/dL Final  . Albumin 08/03/2016 3.7  3.5 - 5.0 g/dL Final  . GFR calc non Af Amer 08/03/2016 5* >60 mL/min Final  . GFR calc Af Amer 08/03/2016 6* >60 mL/min Final   Comment: (NOTE) The eGFR has been calculated using the CKD EPI equation. This calculation has not been validated in all clinical situations. eGFR's persistently <60 mL/min signify possible Chronic Kidney Disease.   . Anion gap 08/03/2016 17* 5 - 15 Final  . Prothrombin Time 08/04/2016 27.7* 11.4 - 15.2 seconds Final  . INR 08/04/2016 2.52   Final  . Sodium 08/04/2016 136  135 - 145 mmol/L Final  . Potassium 08/04/2016 4.9  3.5 - 5.1 mmol/L Final  . Chloride 08/04/2016 96* 101 - 111 mmol/L Final  . CO2 08/04/2016 29  22 - 32 mmol/L Final  . Glucose, Bld 08/04/2016 96  65 - 99 mg/dL Final  . BUN 08/04/2016 24* 6 - 20 mg/dL Final  . Creatinine, Ser 08/04/2016 6.63* 0.61 - 1.24 mg/dL Final  . Calcium 08/04/2016 9.3  8.9 - 10.3 mg/dL Final  . Phosphorus 08/04/2016 5.4* 2.5 - 4.6 mg/dL Final  . Albumin 08/04/2016 3.5  3.5 - 5.0 g/dL Final  . GFR calc non Af Amer 08/04/2016 7* >60 mL/min Final  . GFR calc Af Amer 08/04/2016 9* >60 mL/min Final   Comment: (NOTE) The eGFR has been calculated using the CKD EPI equation. This calculation has not been validated in all clinical situations. eGFR's  persistently <60 mL/min signify possible Chronic Kidney Disease.   . Anion gap 08/04/2016 11  5 - 15 Final  . Prothrombin Time 08/05/2016 28.2* 11.4 - 15.2 seconds Final  . INR 08/05/2016 2.58   Final  . WBC 08/05/2016 6.7  4.0 - 10.5 K/uL Final  . RBC 08/05/2016 3.80* 4.22 - 5.81 MIL/uL Final  . Hemoglobin  08/05/2016 11.2* 13.0 - 17.0 g/dL Final  . HCT 08/05/2016 35.1* 39.0 - 52.0 % Final  . MCV 08/05/2016 92.4  78.0 - 100.0 fL Final  . MCH 08/05/2016 29.5  26.0 - 34.0 pg Final  . MCHC 08/05/2016 31.9  30.0 - 36.0 g/dL Final  . RDW 08/05/2016 15.4  11.5 - 15.5 % Final  . Platelets 08/05/2016 206  150 - 400 K/uL Final  . Sodium 08/05/2016 135  135 - 145 mmol/L Final  . Potassium 08/05/2016 4.8  3.5 - 5.1 mmol/L Final  . Chloride 08/05/2016 95* 101 - 111 mmol/L Final  . CO2 08/05/2016 27  22 - 32 mmol/L Final  . Glucose, Bld 08/05/2016 105* 65 - 99 mg/dL Final  . BUN 08/05/2016 42* 6 - 20 mg/dL Final  . Creatinine, Ser 08/05/2016 9.52* 0.61 - 1.24 mg/dL Final  . Calcium 08/05/2016 9.3  8.9 - 10.3 mg/dL Final  . Phosphorus 08/05/2016 6.3* 2.5 - 4.6 mg/dL Final  . Albumin 08/05/2016 3.5  3.5 - 5.0 g/dL Final  . GFR calc non Af Amer 08/05/2016 5* >60 mL/min Final  . GFR calc Af Amer 08/05/2016 6* >60 mL/min Final   Comment: (NOTE) The eGFR has been calculated using the CKD EPI equation. This calculation has not been validated in all clinical situations. eGFR's persistently <60 mL/min signify possible Chronic Kidney Disease.   . Anion gap 08/05/2016 13  5 - 15 Final  Anti-coag visit on 07/27/2016  Component Date Value Ref Range Status  . INR 07/27/2016 1.4   Final  Admission on 07/16/2016, Discharged on 07/23/2016  No results displayed because visit has over 200 results.    Anti-coag visit on 07/13/2016  Component Date Value Ref Range Status  . INR 07/13/2016 1.7   Final  Office Visit on 06/24/2016  Component Date Value Ref Range Status  . Hgb A1c MFr Bld 06/24/2016 5.5  4.6 -  6.5 % Final  Anti-coag visit on 06/13/2016  Component Date Value Ref Range Status  . INR 06/13/2016 2.1   Final  Scanned Document on 06/02/2016  Component Date Value Ref Range Status  . INR 06/02/2016 1.2* 0.9 - 1.1 Final  There may be more visits with results that are not included.   Imaging: Ir US Guide Vasc Access Left  Result Date: 08/04/2016 INDICATION: 76 year old with end-stage renal disease. Patient was admitted with fatigue and diarrhea. Recent revision of the left upper arm fistula with vein patch near the brachial artery anastomosis. History of left basilic vein transposition. Requested to evaluate the left upper extremity fistula. EXAM: LEFT UPPER EXTREMITY FISTULOGRAM ; ULTRASOUND GUIDANCE FOR VASCULAR ACCESS MEDICATIONS: None. ANESTHESIA/SEDATION: Moderate Sedation Time:  None FLUOROSCOPY TIME:  Fluoroscopy Time: 1 minutes 30 seconds (59 mGy). COMPLICATIONS: None immediate. PROCEDURE: Informed written consent was obtained from the patient after a thorough discussion of the procedural risks, benefits and alternatives. All questions were addressed. A timeout was performed prior to the initiation of the procedure. The left upper arm fistula was confirmed to be patent with ultrasound. The upper arm was prepped with chlorhexidine. An Angiocath was directed into the draining vein with ultrasound guidance. A series of fistulogram images were obtained. The catheter was removed with manual compression. FINDINGS: There is a left upper arm fistula which is patent. Postoperative changes compatible with a vein patch at the arterial anastomosis and a transposition procedure. There is no significant stenosis. The central veins are patent. Arterial anastomosis is patent. IMPRESSION: Left upper arm AV fistula  is patent.  No significant stenosis. ACCESS: This access remains amenable to future percutaneous interventions as clinically indicated. Electronically Signed   By: Markus Daft M.D.   On: 08/04/2016  11:10   Ir Dialy Shunt Intro Needle/intracath Initial W/img Left  Result Date: 08/04/2016 INDICATION: 76 year old with end-stage renal disease. Patient was admitted with fatigue and diarrhea. Recent revision of the left upper arm fistula with vein patch near the brachial artery anastomosis. History of left basilic vein transposition. Requested to evaluate the left upper extremity fistula. EXAM: LEFT UPPER EXTREMITY FISTULOGRAM ; ULTRASOUND GUIDANCE FOR VASCULAR ACCESS MEDICATIONS: None. ANESTHESIA/SEDATION: Moderate Sedation Time:  None FLUOROSCOPY TIME:  Fluoroscopy Time: 1 minutes 30 seconds (59 mGy). COMPLICATIONS: None immediate. PROCEDURE: Informed written consent was obtained from the patient after a thorough discussion of the procedural risks, benefits and alternatives. All questions were addressed. A timeout was performed prior to the initiation of the procedure. The left upper arm fistula was confirmed to be patent with ultrasound. The upper arm was prepped with chlorhexidine. An Angiocath was directed into the draining vein with ultrasound guidance. A series of fistulogram images were obtained. The catheter was removed with manual compression. FINDINGS: There is a left upper arm fistula which is patent. Postoperative changes compatible with a vein patch at the arterial anastomosis and a transposition procedure. There is no significant stenosis. The central veins are patent. Arterial anastomosis is patent. IMPRESSION: Left upper arm AV fistula is patent.  No significant stenosis. ACCESS: This access remains amenable to future percutaneous interventions as clinically indicated. Electronically Signed   By: Markus Daft M.D.   On: 08/04/2016 11:10    Speciality Comments: No specialty comments available.    Procedures:  No procedures performed Allergies: Ace inhibitors   Assessment / Plan:     Visit Diagnoses: Rheumatoid arthritis involving multiple sites with positive rheumatoid factor (HCC) -  erosive disease, negative CCP  Long term current use of anticoagulant therapy - 08/29/2016: Plaquenil 200 mg twice a day  Status post total replacement of right hip  ESRD on dialysis ([place])  History of CHF (congestive heart failure)  History of diabetes mellitus  Chronic gout due to renal impairment of multiple sites without tophus - 08/29/2016: On allopurinol; no flare; adequate response - Plan: Uric acid   Lan: #1: Rheumatoid arthritis. No joint pain swelling or stiffness. Adequate relief at this time.  #2: High risk prescription. Taking Plaquenil 200 mg twice a day. Adequate responsePatient is past due for Plaquenil eye exam. The last one was January 2017. [facility]  #3: Right total #4: Gout. No flare. Using allopurinol as prescribed.  #5:No med refills needed at this timePatient is on allopurinol for gout He is on Plaquenil for rheumatoid arthritis  #6: Return to clinic in 5 mos for follow-up  #7: he has history of  chronic kidney disease with dialysis for the left kidney. He had his right kidney removed due to damage from chronic kidney disease   Orders: Orders Placed This Encounter  Procedures  . Uric acid   No orders of the defined types were placed in this encounter.   Face-to-face time spent with patient was 30 minutes. 50% of time was spent in counseling and coordination of care.  Follow-Up Instructions: Return in about 5 months (around 01/26/2017) for gout, RA,CKD, Rt Nephrectomy;; Bil THR; RTKR;.   Eliezer Lofts, PA-C  Note - This record has been created using [employer].  Chart creation errors have been sought, but may not always  have been located. Such creation errors do not reflect on  the standard of medical care.

## 2016-08-25 NOTE — Telephone Encounter (Signed)
I spoke with patientHe is on dialysisHis creatinine will be elevated and his GFR will be low.June 2017 we sent a message to Dr. [name] requesting his advice on whether or not to continue Plaquenil.He has not responded yet.I told the patient to stay off of Plaquenil for now even though he's been on it recently.He is agreeable. Please send letter to Dr. Abel Presto office for urgent response regarding whether or not he would like [place] to allow the patient to take Plaquenil. (see January 07, 2016 message/correspondence note in srs for our original letter)

## 2016-08-25 NOTE — Telephone Encounter (Signed)
I have refaxed the letter we sent in June 2017. Thank you for calling him.

## 2016-08-27 DIAGNOSIS — D509 Iron deficiency anemia, unspecified: Secondary | ICD-10-CM | POA: Diagnosis not present

## 2016-08-27 DIAGNOSIS — N2581 Secondary hyperparathyroidism of renal origin: Secondary | ICD-10-CM | POA: Diagnosis not present

## 2016-08-27 DIAGNOSIS — E875 Hyperkalemia: Secondary | ICD-10-CM | POA: Diagnosis not present

## 2016-08-27 DIAGNOSIS — I4891 Unspecified atrial fibrillation: Secondary | ICD-10-CM | POA: Diagnosis not present

## 2016-08-27 DIAGNOSIS — D631 Anemia in chronic kidney disease: Secondary | ICD-10-CM | POA: Diagnosis not present

## 2016-08-27 DIAGNOSIS — N186 End stage renal disease: Secondary | ICD-10-CM | POA: Diagnosis not present

## 2016-08-29 ENCOUNTER — Encounter: Payer: Self-pay | Admitting: Rheumatology

## 2016-08-29 ENCOUNTER — Ambulatory Visit (INDEPENDENT_AMBULATORY_CARE_PROVIDER_SITE_OTHER): Payer: Medicare Other | Admitting: Rheumatology

## 2016-08-29 VITALS — BP 136/62 | HR 78 | Resp 14 | Ht 75.0 in | Wt 246.0 lb

## 2016-08-29 DIAGNOSIS — Z7901 Long term (current) use of anticoagulants: Secondary | ICD-10-CM | POA: Diagnosis not present

## 2016-08-29 DIAGNOSIS — N186 End stage renal disease: Secondary | ICD-10-CM

## 2016-08-29 DIAGNOSIS — Z8639 Personal history of other endocrine, nutritional and metabolic disease: Secondary | ICD-10-CM

## 2016-08-29 DIAGNOSIS — Z8679 Personal history of other diseases of the circulatory system: Secondary | ICD-10-CM

## 2016-08-29 DIAGNOSIS — Z992 Dependence on renal dialysis: Secondary | ICD-10-CM

## 2016-08-29 DIAGNOSIS — M0579 Rheumatoid arthritis with rheumatoid factor of multiple sites without organ or systems involvement: Secondary | ICD-10-CM

## 2016-08-29 DIAGNOSIS — M1A39X0 Chronic gout due to renal impairment, multiple sites, without tophus (tophi): Secondary | ICD-10-CM

## 2016-08-29 DIAGNOSIS — Z96641 Presence of right artificial hip joint: Secondary | ICD-10-CM | POA: Diagnosis not present

## 2016-08-29 NOTE — Patient Instructions (Signed)
    Date of birth 10/31/1940 [name] We'll see [facility] on Wednesday, 08/31/2016 for Plaquenil eye exam and currently taking Plaquenil 200 mg twice a day

## 2016-08-30 DIAGNOSIS — E875 Hyperkalemia: Secondary | ICD-10-CM | POA: Diagnosis not present

## 2016-08-30 DIAGNOSIS — N186 End stage renal disease: Secondary | ICD-10-CM | POA: Diagnosis not present

## 2016-08-30 DIAGNOSIS — D509 Iron deficiency anemia, unspecified: Secondary | ICD-10-CM | POA: Diagnosis not present

## 2016-08-30 DIAGNOSIS — I4891 Unspecified atrial fibrillation: Secondary | ICD-10-CM | POA: Diagnosis not present

## 2016-08-30 DIAGNOSIS — D631 Anemia in chronic kidney disease: Secondary | ICD-10-CM | POA: Diagnosis not present

## 2016-08-30 DIAGNOSIS — N2581 Secondary hyperparathyroidism of renal origin: Secondary | ICD-10-CM | POA: Diagnosis not present

## 2016-08-30 LAB — URIC ACID: Uric Acid, Serum: 6.6 mg/dL (ref 4.0–8.0)

## 2016-08-31 DIAGNOSIS — H2513 Age-related nuclear cataract, bilateral: Secondary | ICD-10-CM | POA: Diagnosis not present

## 2016-08-31 DIAGNOSIS — H35033 Hypertensive retinopathy, bilateral: Secondary | ICD-10-CM | POA: Diagnosis not present

## 2016-08-31 DIAGNOSIS — H524 Presbyopia: Secondary | ICD-10-CM | POA: Diagnosis not present

## 2016-08-31 DIAGNOSIS — Z79899 Other long term (current) drug therapy: Secondary | ICD-10-CM | POA: Diagnosis not present

## 2016-08-31 DIAGNOSIS — M0569 Rheumatoid arthritis of multiple sites with involvement of other organs and systems: Secondary | ICD-10-CM | POA: Diagnosis not present

## 2016-08-31 DIAGNOSIS — H52223 Regular astigmatism, bilateral: Secondary | ICD-10-CM | POA: Diagnosis not present

## 2016-09-01 DIAGNOSIS — N186 End stage renal disease: Secondary | ICD-10-CM | POA: Diagnosis not present

## 2016-09-01 DIAGNOSIS — I4891 Unspecified atrial fibrillation: Secondary | ICD-10-CM | POA: Diagnosis not present

## 2016-09-01 DIAGNOSIS — D631 Anemia in chronic kidney disease: Secondary | ICD-10-CM | POA: Diagnosis not present

## 2016-09-01 DIAGNOSIS — E875 Hyperkalemia: Secondary | ICD-10-CM | POA: Diagnosis not present

## 2016-09-01 DIAGNOSIS — D509 Iron deficiency anemia, unspecified: Secondary | ICD-10-CM | POA: Diagnosis not present

## 2016-09-01 DIAGNOSIS — N2581 Secondary hyperparathyroidism of renal origin: Secondary | ICD-10-CM | POA: Diagnosis not present

## 2016-09-02 ENCOUNTER — Telehealth (HOSPITAL_COMMUNITY): Payer: Self-pay | Admitting: Vascular Surgery

## 2016-09-02 NOTE — Telephone Encounter (Signed)
Left pt message to move appt from 3/21

## 2016-09-03 DIAGNOSIS — I4891 Unspecified atrial fibrillation: Secondary | ICD-10-CM | POA: Diagnosis not present

## 2016-09-03 DIAGNOSIS — E875 Hyperkalemia: Secondary | ICD-10-CM | POA: Diagnosis not present

## 2016-09-03 DIAGNOSIS — N2581 Secondary hyperparathyroidism of renal origin: Secondary | ICD-10-CM | POA: Diagnosis not present

## 2016-09-03 DIAGNOSIS — D509 Iron deficiency anemia, unspecified: Secondary | ICD-10-CM | POA: Diagnosis not present

## 2016-09-03 DIAGNOSIS — N186 End stage renal disease: Secondary | ICD-10-CM | POA: Diagnosis not present

## 2016-09-03 DIAGNOSIS — D631 Anemia in chronic kidney disease: Secondary | ICD-10-CM | POA: Diagnosis not present

## 2016-09-06 DIAGNOSIS — D631 Anemia in chronic kidney disease: Secondary | ICD-10-CM | POA: Diagnosis not present

## 2016-09-06 DIAGNOSIS — I4891 Unspecified atrial fibrillation: Secondary | ICD-10-CM | POA: Diagnosis not present

## 2016-09-06 DIAGNOSIS — E875 Hyperkalemia: Secondary | ICD-10-CM | POA: Diagnosis not present

## 2016-09-06 DIAGNOSIS — N2581 Secondary hyperparathyroidism of renal origin: Secondary | ICD-10-CM | POA: Diagnosis not present

## 2016-09-06 DIAGNOSIS — D509 Iron deficiency anemia, unspecified: Secondary | ICD-10-CM | POA: Diagnosis not present

## 2016-09-06 DIAGNOSIS — N186 End stage renal disease: Secondary | ICD-10-CM | POA: Diagnosis not present

## 2016-09-07 ENCOUNTER — Ambulatory Visit (INDEPENDENT_AMBULATORY_CARE_PROVIDER_SITE_OTHER): Payer: Medicare Other | Admitting: *Deleted

## 2016-09-07 DIAGNOSIS — I4891 Unspecified atrial fibrillation: Secondary | ICD-10-CM | POA: Diagnosis not present

## 2016-09-07 DIAGNOSIS — Z5181 Encounter for therapeutic drug level monitoring: Secondary | ICD-10-CM | POA: Diagnosis not present

## 2016-09-07 LAB — POCT INR: INR: 1.4

## 2016-09-08 DIAGNOSIS — D509 Iron deficiency anemia, unspecified: Secondary | ICD-10-CM | POA: Diagnosis not present

## 2016-09-08 DIAGNOSIS — N186 End stage renal disease: Secondary | ICD-10-CM | POA: Diagnosis not present

## 2016-09-08 DIAGNOSIS — D631 Anemia in chronic kidney disease: Secondary | ICD-10-CM | POA: Diagnosis not present

## 2016-09-08 DIAGNOSIS — I4891 Unspecified atrial fibrillation: Secondary | ICD-10-CM | POA: Diagnosis not present

## 2016-09-08 DIAGNOSIS — E875 Hyperkalemia: Secondary | ICD-10-CM | POA: Diagnosis not present

## 2016-09-08 DIAGNOSIS — N2581 Secondary hyperparathyroidism of renal origin: Secondary | ICD-10-CM | POA: Diagnosis not present

## 2016-09-10 DIAGNOSIS — D631 Anemia in chronic kidney disease: Secondary | ICD-10-CM | POA: Diagnosis not present

## 2016-09-10 DIAGNOSIS — E875 Hyperkalemia: Secondary | ICD-10-CM | POA: Diagnosis not present

## 2016-09-10 DIAGNOSIS — N2581 Secondary hyperparathyroidism of renal origin: Secondary | ICD-10-CM | POA: Diagnosis not present

## 2016-09-10 DIAGNOSIS — D509 Iron deficiency anemia, unspecified: Secondary | ICD-10-CM | POA: Diagnosis not present

## 2016-09-10 DIAGNOSIS — I4891 Unspecified atrial fibrillation: Secondary | ICD-10-CM | POA: Diagnosis not present

## 2016-09-10 DIAGNOSIS — N186 End stage renal disease: Secondary | ICD-10-CM | POA: Diagnosis not present

## 2016-09-12 ENCOUNTER — Telehealth: Payer: Self-pay | Admitting: Rheumatology

## 2016-09-13 ENCOUNTER — Telehealth: Payer: Self-pay | Admitting: Family

## 2016-09-13 DIAGNOSIS — N186 End stage renal disease: Secondary | ICD-10-CM | POA: Diagnosis not present

## 2016-09-13 DIAGNOSIS — N2581 Secondary hyperparathyroidism of renal origin: Secondary | ICD-10-CM | POA: Diagnosis not present

## 2016-09-13 DIAGNOSIS — D509 Iron deficiency anemia, unspecified: Secondary | ICD-10-CM | POA: Diagnosis not present

## 2016-09-13 DIAGNOSIS — E875 Hyperkalemia: Secondary | ICD-10-CM | POA: Diagnosis not present

## 2016-09-13 DIAGNOSIS — I4891 Unspecified atrial fibrillation: Secondary | ICD-10-CM | POA: Diagnosis not present

## 2016-09-13 DIAGNOSIS — D631 Anemia in chronic kidney disease: Secondary | ICD-10-CM | POA: Diagnosis not present

## 2016-09-13 NOTE — Telephone Encounter (Signed)
Was that an increase in his dose?

## 2016-09-13 NOTE — Telephone Encounter (Signed)
Pt reported the kidney doctor gave him 1 and a half  Tablets that day and advised him  to continue regular dose

## 2016-09-13 NOTE — Telephone Encounter (Signed)
01/30/12 PR PPPS, SUBSEQ VISIT [[id number]] Patient scheduled AWV with Glenard Haring 11/14/16 at 8am and follow up with PCP at 8:30am

## 2016-09-13 NOTE — Telephone Encounter (Signed)
Reviewed lab work sent from Dr. Mercy Moore.  INR was low on 2/23.  Was his coumadin dosing adjusted by his kidney doctor?

## 2016-09-14 ENCOUNTER — Ambulatory Visit (INDEPENDENT_AMBULATORY_CARE_PROVIDER_SITE_OTHER): Payer: Medicare Other | Admitting: *Deleted

## 2016-09-14 DIAGNOSIS — N186 End stage renal disease: Secondary | ICD-10-CM | POA: Diagnosis not present

## 2016-09-14 DIAGNOSIS — Z5181 Encounter for therapeutic drug level monitoring: Secondary | ICD-10-CM

## 2016-09-14 DIAGNOSIS — Z992 Dependence on renal dialysis: Secondary | ICD-10-CM | POA: Diagnosis not present

## 2016-09-14 DIAGNOSIS — I4891 Unspecified atrial fibrillation: Secondary | ICD-10-CM

## 2016-09-14 DIAGNOSIS — I158 Other secondary hypertension: Secondary | ICD-10-CM | POA: Diagnosis not present

## 2016-09-14 LAB — POCT INR: INR: 2.2

## 2016-09-14 NOTE — Telephone Encounter (Signed)
Increased it for that one day then told him to go back on his regular dose

## 2016-09-14 NOTE — Telephone Encounter (Signed)
Ok,noted

## 2016-09-15 DIAGNOSIS — Z23 Encounter for immunization: Secondary | ICD-10-CM | POA: Diagnosis not present

## 2016-09-15 DIAGNOSIS — E8770 Fluid overload, unspecified: Secondary | ICD-10-CM | POA: Diagnosis not present

## 2016-09-15 DIAGNOSIS — E875 Hyperkalemia: Secondary | ICD-10-CM | POA: Diagnosis not present

## 2016-09-15 DIAGNOSIS — N186 End stage renal disease: Secondary | ICD-10-CM | POA: Diagnosis not present

## 2016-09-15 DIAGNOSIS — N2581 Secondary hyperparathyroidism of renal origin: Secondary | ICD-10-CM | POA: Diagnosis not present

## 2016-09-15 DIAGNOSIS — D631 Anemia in chronic kidney disease: Secondary | ICD-10-CM | POA: Diagnosis not present

## 2016-09-15 DIAGNOSIS — I4891 Unspecified atrial fibrillation: Secondary | ICD-10-CM | POA: Diagnosis not present

## 2016-09-17 DIAGNOSIS — N186 End stage renal disease: Secondary | ICD-10-CM | POA: Diagnosis not present

## 2016-09-17 DIAGNOSIS — E875 Hyperkalemia: Secondary | ICD-10-CM | POA: Diagnosis not present

## 2016-09-17 DIAGNOSIS — I4891 Unspecified atrial fibrillation: Secondary | ICD-10-CM | POA: Diagnosis not present

## 2016-09-17 DIAGNOSIS — Z23 Encounter for immunization: Secondary | ICD-10-CM | POA: Diagnosis not present

## 2016-09-17 DIAGNOSIS — D631 Anemia in chronic kidney disease: Secondary | ICD-10-CM | POA: Diagnosis not present

## 2016-09-17 DIAGNOSIS — N2581 Secondary hyperparathyroidism of renal origin: Secondary | ICD-10-CM | POA: Diagnosis not present

## 2016-09-20 DIAGNOSIS — N186 End stage renal disease: Secondary | ICD-10-CM | POA: Diagnosis not present

## 2016-09-20 DIAGNOSIS — Z23 Encounter for immunization: Secondary | ICD-10-CM | POA: Diagnosis not present

## 2016-09-20 DIAGNOSIS — I4891 Unspecified atrial fibrillation: Secondary | ICD-10-CM | POA: Diagnosis not present

## 2016-09-20 DIAGNOSIS — N2581 Secondary hyperparathyroidism of renal origin: Secondary | ICD-10-CM | POA: Diagnosis not present

## 2016-09-20 DIAGNOSIS — D631 Anemia in chronic kidney disease: Secondary | ICD-10-CM | POA: Diagnosis not present

## 2016-09-20 DIAGNOSIS — E875 Hyperkalemia: Secondary | ICD-10-CM | POA: Diagnosis not present

## 2016-09-21 ENCOUNTER — Ambulatory Visit: Payer: Medicare Other | Admitting: Family

## 2016-09-22 DIAGNOSIS — E875 Hyperkalemia: Secondary | ICD-10-CM | POA: Diagnosis not present

## 2016-09-22 DIAGNOSIS — D631 Anemia in chronic kidney disease: Secondary | ICD-10-CM | POA: Diagnosis not present

## 2016-09-22 DIAGNOSIS — N2581 Secondary hyperparathyroidism of renal origin: Secondary | ICD-10-CM | POA: Diagnosis not present

## 2016-09-22 DIAGNOSIS — N186 End stage renal disease: Secondary | ICD-10-CM | POA: Diagnosis not present

## 2016-09-22 DIAGNOSIS — Z23 Encounter for immunization: Secondary | ICD-10-CM | POA: Diagnosis not present

## 2016-09-22 DIAGNOSIS — I4891 Unspecified atrial fibrillation: Secondary | ICD-10-CM | POA: Diagnosis not present

## 2016-09-24 ENCOUNTER — Other Ambulatory Visit: Payer: Self-pay | Admitting: Rheumatology

## 2016-09-24 DIAGNOSIS — N186 End stage renal disease: Secondary | ICD-10-CM | POA: Diagnosis not present

## 2016-09-24 DIAGNOSIS — I4891 Unspecified atrial fibrillation: Secondary | ICD-10-CM | POA: Diagnosis not present

## 2016-09-24 DIAGNOSIS — Z23 Encounter for immunization: Secondary | ICD-10-CM | POA: Diagnosis not present

## 2016-09-24 DIAGNOSIS — D631 Anemia in chronic kidney disease: Secondary | ICD-10-CM | POA: Diagnosis not present

## 2016-09-24 DIAGNOSIS — E875 Hyperkalemia: Secondary | ICD-10-CM | POA: Diagnosis not present

## 2016-09-24 DIAGNOSIS — N2581 Secondary hyperparathyroidism of renal origin: Secondary | ICD-10-CM | POA: Diagnosis not present

## 2016-09-26 NOTE — Telephone Encounter (Signed)
NEED DOCUMENTATION OF UPDATED PLQ EYE EXAM IN NEXT 3-5 WEEKS. IT IS URGENT THAT WE HAVE DOCUMENTATION

## 2016-09-26 NOTE — Telephone Encounter (Signed)
He  Is on dialysis Last visit 08/29/16 Next visit 01/30/17 Labs scanned in on 09/08/16 /see labs if you want to review c/w dialysis   I called him about the eye exam left message for him to call back about this, none on file for couple years.  Ok to refill PLQ ?

## 2016-09-27 DIAGNOSIS — Z23 Encounter for immunization: Secondary | ICD-10-CM | POA: Diagnosis not present

## 2016-09-27 DIAGNOSIS — N186 End stage renal disease: Secondary | ICD-10-CM | POA: Diagnosis not present

## 2016-09-27 DIAGNOSIS — N2581 Secondary hyperparathyroidism of renal origin: Secondary | ICD-10-CM | POA: Diagnosis not present

## 2016-09-27 DIAGNOSIS — E875 Hyperkalemia: Secondary | ICD-10-CM | POA: Diagnosis not present

## 2016-09-27 DIAGNOSIS — I4891 Unspecified atrial fibrillation: Secondary | ICD-10-CM | POA: Diagnosis not present

## 2016-09-27 DIAGNOSIS — D631 Anemia in chronic kidney disease: Secondary | ICD-10-CM | POA: Diagnosis not present

## 2016-09-28 ENCOUNTER — Ambulatory Visit (INDEPENDENT_AMBULATORY_CARE_PROVIDER_SITE_OTHER): Payer: Medicare Other | Admitting: *Deleted

## 2016-09-28 DIAGNOSIS — N2581 Secondary hyperparathyroidism of renal origin: Secondary | ICD-10-CM | POA: Diagnosis not present

## 2016-09-28 DIAGNOSIS — E875 Hyperkalemia: Secondary | ICD-10-CM | POA: Diagnosis not present

## 2016-09-28 DIAGNOSIS — D631 Anemia in chronic kidney disease: Secondary | ICD-10-CM | POA: Diagnosis not present

## 2016-09-28 DIAGNOSIS — N186 End stage renal disease: Secondary | ICD-10-CM | POA: Diagnosis not present

## 2016-09-28 DIAGNOSIS — Z5181 Encounter for therapeutic drug level monitoring: Secondary | ICD-10-CM

## 2016-09-28 DIAGNOSIS — I4891 Unspecified atrial fibrillation: Secondary | ICD-10-CM | POA: Diagnosis not present

## 2016-09-28 DIAGNOSIS — Z23 Encounter for immunization: Secondary | ICD-10-CM | POA: Diagnosis not present

## 2016-09-28 LAB — POCT INR: INR: 2.2

## 2016-09-29 DIAGNOSIS — D631 Anemia in chronic kidney disease: Secondary | ICD-10-CM | POA: Diagnosis not present

## 2016-09-29 DIAGNOSIS — E875 Hyperkalemia: Secondary | ICD-10-CM | POA: Diagnosis not present

## 2016-09-29 DIAGNOSIS — N2581 Secondary hyperparathyroidism of renal origin: Secondary | ICD-10-CM | POA: Diagnosis not present

## 2016-09-29 DIAGNOSIS — I4891 Unspecified atrial fibrillation: Secondary | ICD-10-CM | POA: Diagnosis not present

## 2016-09-29 DIAGNOSIS — N186 End stage renal disease: Secondary | ICD-10-CM | POA: Diagnosis not present

## 2016-09-29 DIAGNOSIS — Z23 Encounter for immunization: Secondary | ICD-10-CM | POA: Diagnosis not present

## 2016-10-01 DIAGNOSIS — N186 End stage renal disease: Secondary | ICD-10-CM | POA: Diagnosis not present

## 2016-10-01 DIAGNOSIS — E875 Hyperkalemia: Secondary | ICD-10-CM | POA: Diagnosis not present

## 2016-10-01 DIAGNOSIS — Z23 Encounter for immunization: Secondary | ICD-10-CM | POA: Diagnosis not present

## 2016-10-01 DIAGNOSIS — N2581 Secondary hyperparathyroidism of renal origin: Secondary | ICD-10-CM | POA: Diagnosis not present

## 2016-10-01 DIAGNOSIS — D631 Anemia in chronic kidney disease: Secondary | ICD-10-CM | POA: Diagnosis not present

## 2016-10-01 DIAGNOSIS — I4891 Unspecified atrial fibrillation: Secondary | ICD-10-CM | POA: Diagnosis not present

## 2016-10-04 DIAGNOSIS — D631 Anemia in chronic kidney disease: Secondary | ICD-10-CM | POA: Diagnosis not present

## 2016-10-04 DIAGNOSIS — N186 End stage renal disease: Secondary | ICD-10-CM | POA: Diagnosis not present

## 2016-10-04 DIAGNOSIS — I4891 Unspecified atrial fibrillation: Secondary | ICD-10-CM | POA: Diagnosis not present

## 2016-10-04 DIAGNOSIS — Z23 Encounter for immunization: Secondary | ICD-10-CM | POA: Diagnosis not present

## 2016-10-04 DIAGNOSIS — E875 Hyperkalemia: Secondary | ICD-10-CM | POA: Diagnosis not present

## 2016-10-04 DIAGNOSIS — N2581 Secondary hyperparathyroidism of renal origin: Secondary | ICD-10-CM | POA: Diagnosis not present

## 2016-10-05 ENCOUNTER — Encounter (HOSPITAL_COMMUNITY): Payer: Medicare Other | Admitting: Internal Medicine

## 2016-10-06 DIAGNOSIS — I4891 Unspecified atrial fibrillation: Secondary | ICD-10-CM | POA: Diagnosis not present

## 2016-10-06 DIAGNOSIS — N186 End stage renal disease: Secondary | ICD-10-CM | POA: Diagnosis not present

## 2016-10-06 DIAGNOSIS — N2581 Secondary hyperparathyroidism of renal origin: Secondary | ICD-10-CM | POA: Diagnosis not present

## 2016-10-06 DIAGNOSIS — E875 Hyperkalemia: Secondary | ICD-10-CM | POA: Diagnosis not present

## 2016-10-06 DIAGNOSIS — D631 Anemia in chronic kidney disease: Secondary | ICD-10-CM | POA: Diagnosis not present

## 2016-10-06 DIAGNOSIS — Z23 Encounter for immunization: Secondary | ICD-10-CM | POA: Diagnosis not present

## 2016-10-08 DIAGNOSIS — Z23 Encounter for immunization: Secondary | ICD-10-CM | POA: Diagnosis not present

## 2016-10-08 DIAGNOSIS — D631 Anemia in chronic kidney disease: Secondary | ICD-10-CM | POA: Diagnosis not present

## 2016-10-08 DIAGNOSIS — I4891 Unspecified atrial fibrillation: Secondary | ICD-10-CM | POA: Diagnosis not present

## 2016-10-08 DIAGNOSIS — E875 Hyperkalemia: Secondary | ICD-10-CM | POA: Diagnosis not present

## 2016-10-08 DIAGNOSIS — N186 End stage renal disease: Secondary | ICD-10-CM | POA: Diagnosis not present

## 2016-10-08 DIAGNOSIS — N2581 Secondary hyperparathyroidism of renal origin: Secondary | ICD-10-CM | POA: Diagnosis not present

## 2016-10-11 DIAGNOSIS — Z23 Encounter for immunization: Secondary | ICD-10-CM | POA: Diagnosis not present

## 2016-10-11 DIAGNOSIS — I4891 Unspecified atrial fibrillation: Secondary | ICD-10-CM | POA: Diagnosis not present

## 2016-10-11 DIAGNOSIS — E875 Hyperkalemia: Secondary | ICD-10-CM | POA: Diagnosis not present

## 2016-10-11 DIAGNOSIS — N2581 Secondary hyperparathyroidism of renal origin: Secondary | ICD-10-CM | POA: Diagnosis not present

## 2016-10-11 DIAGNOSIS — N186 End stage renal disease: Secondary | ICD-10-CM | POA: Diagnosis not present

## 2016-10-11 DIAGNOSIS — D631 Anemia in chronic kidney disease: Secondary | ICD-10-CM | POA: Diagnosis not present

## 2016-10-12 DIAGNOSIS — E8770 Fluid overload, unspecified: Secondary | ICD-10-CM | POA: Diagnosis not present

## 2016-10-12 DIAGNOSIS — N2581 Secondary hyperparathyroidism of renal origin: Secondary | ICD-10-CM | POA: Diagnosis not present

## 2016-10-12 DIAGNOSIS — N186 End stage renal disease: Secondary | ICD-10-CM | POA: Diagnosis not present

## 2016-10-13 DIAGNOSIS — N2581 Secondary hyperparathyroidism of renal origin: Secondary | ICD-10-CM | POA: Diagnosis not present

## 2016-10-13 DIAGNOSIS — E875 Hyperkalemia: Secondary | ICD-10-CM | POA: Diagnosis not present

## 2016-10-13 DIAGNOSIS — N186 End stage renal disease: Secondary | ICD-10-CM | POA: Diagnosis not present

## 2016-10-13 DIAGNOSIS — Z23 Encounter for immunization: Secondary | ICD-10-CM | POA: Diagnosis not present

## 2016-10-13 DIAGNOSIS — D631 Anemia in chronic kidney disease: Secondary | ICD-10-CM | POA: Diagnosis not present

## 2016-10-13 DIAGNOSIS — I4891 Unspecified atrial fibrillation: Secondary | ICD-10-CM | POA: Diagnosis not present

## 2016-10-15 DIAGNOSIS — D631 Anemia in chronic kidney disease: Secondary | ICD-10-CM | POA: Diagnosis not present

## 2016-10-15 DIAGNOSIS — I4891 Unspecified atrial fibrillation: Secondary | ICD-10-CM | POA: Diagnosis not present

## 2016-10-15 DIAGNOSIS — Z23 Encounter for immunization: Secondary | ICD-10-CM | POA: Diagnosis not present

## 2016-10-15 DIAGNOSIS — E875 Hyperkalemia: Secondary | ICD-10-CM | POA: Diagnosis not present

## 2016-10-15 DIAGNOSIS — Z992 Dependence on renal dialysis: Secondary | ICD-10-CM | POA: Diagnosis not present

## 2016-10-15 DIAGNOSIS — N2581 Secondary hyperparathyroidism of renal origin: Secondary | ICD-10-CM | POA: Diagnosis not present

## 2016-10-15 DIAGNOSIS — I158 Other secondary hypertension: Secondary | ICD-10-CM | POA: Diagnosis not present

## 2016-10-15 DIAGNOSIS — N186 End stage renal disease: Secondary | ICD-10-CM | POA: Diagnosis not present

## 2016-10-17 ENCOUNTER — Ambulatory Visit (HOSPITAL_COMMUNITY)
Admission: RE | Admit: 2016-10-17 | Discharge: 2016-10-17 | Disposition: A | Discharge status: Home / Self Care | Payer: Medicare Other | Source: Ambulatory Visit | Attending: Internal Medicine | Admitting: Internal Medicine

## 2016-10-17 VITALS — BP 100/54 | HR 54 | Wt 253.8 lb

## 2016-10-17 DIAGNOSIS — N186 End stage renal disease: Secondary | ICD-10-CM | POA: Diagnosis not present

## 2016-10-17 DIAGNOSIS — I428 Other cardiomyopathies: Secondary | ICD-10-CM | POA: Diagnosis not present

## 2016-10-17 DIAGNOSIS — Z87442 Personal history of urinary calculi: Secondary | ICD-10-CM | POA: Insufficient documentation

## 2016-10-17 DIAGNOSIS — M1611 Unilateral primary osteoarthritis, right hip: Secondary | ICD-10-CM | POA: Diagnosis not present

## 2016-10-17 DIAGNOSIS — I482 Chronic atrial fibrillation: Secondary | ICD-10-CM | POA: Diagnosis not present

## 2016-10-17 DIAGNOSIS — Z86010 Personal history of colonic polyps: Secondary | ICD-10-CM | POA: Diagnosis not present

## 2016-10-17 DIAGNOSIS — R001 Bradycardia, unspecified: Secondary | ICD-10-CM | POA: Insufficient documentation

## 2016-10-17 DIAGNOSIS — I480 Paroxysmal atrial fibrillation: Secondary | ICD-10-CM

## 2016-10-17 DIAGNOSIS — I5022 Chronic systolic (congestive) heart failure: Secondary | ICD-10-CM | POA: Diagnosis not present

## 2016-10-17 DIAGNOSIS — M069 Rheumatoid arthritis, unspecified: Secondary | ICD-10-CM | POA: Diagnosis not present

## 2016-10-17 DIAGNOSIS — G4733 Obstructive sleep apnea (adult) (pediatric): Secondary | ICD-10-CM | POA: Diagnosis not present

## 2016-10-17 DIAGNOSIS — I132 Hypertensive heart and chronic kidney disease with heart failure and with stage 5 chronic kidney disease, or end stage renal disease: Secondary | ICD-10-CM | POA: Insufficient documentation

## 2016-10-17 DIAGNOSIS — Z8249 Family history of ischemic heart disease and other diseases of the circulatory system: Secondary | ICD-10-CM | POA: Diagnosis not present

## 2016-10-17 DIAGNOSIS — Z992 Dependence on renal dialysis: Secondary | ICD-10-CM

## 2016-10-17 DIAGNOSIS — Z7901 Long term (current) use of anticoagulants: Secondary | ICD-10-CM | POA: Insufficient documentation

## 2016-10-17 DIAGNOSIS — Z79899 Other long term (current) drug therapy: Secondary | ICD-10-CM | POA: Insufficient documentation

## 2016-10-17 NOTE — Patient Instructions (Signed)
No changes to medication at this time.  No lab work today.  Follow up with Dr. Haroldine Laws in 1 year.  Do the following things EVERYDAY: 1) Weigh yourself in the morning before breakfast. Write it down and keep it in a log. 2) Take your medicines as prescribed 3) Eat low salt foods-Limit salt (sodium) to 2000 mg per day.  4) Stay as active as you can everyday 5) Limit all fluids for the day to less than 2 liters

## 2016-10-17 NOTE — Progress Notes (Signed)
Patient ID: Bruce Cole, male   DOB: [birth year], 76 y.o.   MRN: [id number]    Advanced Heart Failure Clinic Note   Primary HF: Dr. Haroldine Laws  Nephrology: Dr [name]  HPI: Mr. Fontenette is a 76 y.o.  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 He has been on chronic coumadin therapy.   R/LHC 02/2013 RA = 28 with prominent v-waves  RV = 67/21/28  PA = 70/23 (42)  PCW = 29  Fick cardiac output/index = 7.5/2.8  PVR = 1.8 [place]  SVR = 610  FA sat = 96%  PA sat = 64%, 70%  No RV LV interaction  Near equalization of RV, LV and RA diastolic pressures ** Essentially normal coronary arteries**  Echo 03/2013 EF 45-50% with grade II diastolic dysfunction, moderate to severe RV dilation with mild systolic dysfunction, PA systolic pressure 60 mmHg.   Admitted 1/30-2/04/2016 with volume overload.  Creatinine was noted to have been gradually increasing over the past several months prior to admission, and was as high as 3.92 (day of discharge) With BUN in range of 90-100. He had left AVF placed 08/26/15. Dialysis not initiated during that admission. Discharge weight 281 lbs.  Admitted 11/10/15-11/25/15 with worsening dyspnea and noted AKI. He had fistulogram on 5/4 which showed his previously placed AVF was not usable and had repeat AV fistula placed 11/23/15.  Dialysis initiated during this admission via temporary cath. Discharge weight 228 lbs (Down from > 270 on admission)  He presents today for follow up.  Remains on HD Tue/Thur/Saturday. Underwent hip surgery last year without problem. Last echo 7/17 EF 35-40% . Gets around better after hip surgery. Denies SOB or CP.  No orthopnea or PND. Weight  Up a bit at 240 pounds. BP drops a bit during HD but tolerable. All BP meds have been stopped .    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  Past Medical History:  Diagnosis Date  . Acute blood loss anemia   . Atrial fibrillation (HCC)    Chronic  . Chronic combined systolic and diastolic heart failure ([place])   . Colon polyp 2000  . Dysrhythmia    hx  . ESRD on hemodialysis ([place])    [place]  . Essential hypertension   . Gastritis and gastroduodenitis   . GI bleed   . Gout   . Heart murmur   . History of blood transfusion   . History of kidney stones   .  Nephrolithiasis   . OSA (obstructive sleep apnea) 09/02/2013    IMPRESSION :  1. Mild obstructive sleep apnea with hypopneas causing sleep fragmentation and moderate oxygen desaturation.  2. Short runs of nonsustained VT were noted. His beta blocker may need to be titrated 3. Significant PLM's were noted, the PLM arousal index was low. Please correlate with clinical history of restless leg syndrome.  4. Sleep efficiency was poor.  RECOMMENDATION:  1. Treatment options for this degree of sleep disordered breathing include weight loss and positional therapy to avoid supine sleep 2. Consider titrating beta blocker further, defer to cardiologist 3. Patient should be cautioned against driving when sleepy.They should be asked to avoid medications with sedative side effects     . Osteoarthritis of right hip   . Pneumonia    hx 30 yrs ago  . Primary osteoarthritis of right hip   . Rheumatoid arthritis ([place])   . Thrombocytopenia ([place])     Current Outpatient Prescriptions  Medication Sig  Dispense Refill  . acetaminophen (TYLENOL) 500 MG tablet Take 500 mg by mouth every 6 (six) hours as needed for mild pain.    [name] albuterol (PROVENTIL) (2.5 MG/3ML) 0.083% nebulizer solution Take 2.5 mg by nebulization every 4 (four) hours as needed for shortness of breath. Reported on 09/25/2015    . B Complex-C-Folic Acid (RENA-VITE RX) 1 MG TABS Take 1 tablet by mouth at bedtime.   6  . fluticasone (FLONASE) 50 MCG/ACT nasal spray Place 2 sprays into both nostrils daily. (Patient taking differently: Place 2 sprays into both nostrils daily as needed for allergies. ) 16 g 6  . gabapentin (NEURONTIN) 100 MG capsule Take 100 mg by mouth 2 (two) times daily.    . hydroxychloroquine (PLAQUENIL) 200 MG tablet TAKE 1 TABLET(200 MG) BY MOUTH TWICE DAILY 60 tablet 0  . RENVELA 800 MG tablet Take 3 tablets (2,400 mg total) by mouth 3 (three) times daily with meals. 90 tablet 0  . warfarin (COUMADIN) 7.5 MG tablet Take 7.5 mg by mouth daily.     No current facility-administered medications for this encounter.    PHYSICAL EXAM: Vitals:   10/17/16 1057  BP: (!) 100/54  Pulse: (!) 54  SpO2: 99%  Weight: 253 lb 12 oz (115.1 kg)   Wt Readings from Last 3 Encounters:  10/17/16 253 lb 12 oz (115.1 kg)  08/29/16 246 lb (111.6 kg)  08/17/16 236 lb 3.2 oz (107.1 kg)     General:  Well appearing. No resp difficulty. In [place].  HEENT: normal Neck: supple. JVP 9-10  Carotids 2+ bilaterally; no bruits. No thyromegaly or nodule noted.  Cor: PMI normal. Bradycardic, Irregular, no records or gallops  2/6 early SEM RUSB Lungs: Clear, normal effort. No wheezing Abdomen: soft, NT, ND, no HSM. No bruits or masses. +BS  Extremities: no cyanosis, clubbing, rash, no edema. L forearm AV with thrill.  Neuro: alert & oriented x 3, cranial nerves grossly intact. Moves all 4 extremities w/o difficulty. Affect pleasant.  ASSESSMENT & PLAN: 1) Chronic systolic HF with prominent RV failure due to NICM: Echo 07/2015 EF 40-45%,  diffuse hypokinesis, diastolic flattening, Mod LAE, Severe RAE, Severe TR, PA peak pressure 44 mm Hg. Echo 7/17 EF 35-40%  RHC/LHC in 8/14 with no CAD, suggestive of restrictive physiology, and pulmonary venous hypertension (low PVR).  - Chronic NYHA II-III. Volume status well controlled with HD.  - Off all HF meds due to low BP on HD.   2) OSA:  Unable to tolerate CPAP previously. F/u sleep study was improved.  3) Atrial fibrillation: Chronic, rate controlled. Continue coumadin.  - Has refused NOAC in past. Continue coumadin with ESRD. - Bradycardia is chronic and asymptomatic 4) ESRD now on dialysis.  - Follows with Dr [name].  - Much improved overall now on dialysis.   Wylee Dorantes,MD 11:04 AM

## 2016-10-17 NOTE — Addendum Note (Signed)
Encounter addended by: Effie Berkshire, RN on: 10/17/2016 11:18 AM<BR>    Actions taken: Sign clinical note

## 2016-10-18 DIAGNOSIS — N2581 Secondary hyperparathyroidism of renal origin: Secondary | ICD-10-CM | POA: Diagnosis not present

## 2016-10-18 DIAGNOSIS — I4891 Unspecified atrial fibrillation: Secondary | ICD-10-CM | POA: Diagnosis not present

## 2016-10-18 DIAGNOSIS — N186 End stage renal disease: Secondary | ICD-10-CM | POA: Diagnosis not present

## 2016-10-18 DIAGNOSIS — D631 Anemia in chronic kidney disease: Secondary | ICD-10-CM | POA: Diagnosis not present

## 2016-10-18 DIAGNOSIS — E8770 Fluid overload, unspecified: Secondary | ICD-10-CM | POA: Diagnosis not present

## 2016-10-18 DIAGNOSIS — D509 Iron deficiency anemia, unspecified: Secondary | ICD-10-CM | POA: Diagnosis not present

## 2016-10-18 DIAGNOSIS — Z23 Encounter for immunization: Secondary | ICD-10-CM | POA: Diagnosis not present

## 2016-10-19 ENCOUNTER — Ambulatory Visit (INDEPENDENT_AMBULATORY_CARE_PROVIDER_SITE_OTHER): Payer: Medicare Other | Admitting: *Deleted

## 2016-10-19 DIAGNOSIS — E8770 Fluid overload, unspecified: Secondary | ICD-10-CM | POA: Diagnosis not present

## 2016-10-19 DIAGNOSIS — N186 End stage renal disease: Secondary | ICD-10-CM | POA: Diagnosis not present

## 2016-10-19 DIAGNOSIS — I4891 Unspecified atrial fibrillation: Secondary | ICD-10-CM | POA: Diagnosis not present

## 2016-10-19 DIAGNOSIS — Z5181 Encounter for therapeutic drug level monitoring: Secondary | ICD-10-CM | POA: Diagnosis not present

## 2016-10-19 DIAGNOSIS — N2581 Secondary hyperparathyroidism of renal origin: Secondary | ICD-10-CM | POA: Diagnosis not present

## 2016-10-19 LAB — POCT INR: INR: 1.3

## 2016-10-20 DIAGNOSIS — N2581 Secondary hyperparathyroidism of renal origin: Secondary | ICD-10-CM | POA: Diagnosis not present

## 2016-10-20 DIAGNOSIS — I4891 Unspecified atrial fibrillation: Secondary | ICD-10-CM | POA: Diagnosis not present

## 2016-10-20 DIAGNOSIS — Z23 Encounter for immunization: Secondary | ICD-10-CM | POA: Diagnosis not present

## 2016-10-20 DIAGNOSIS — D509 Iron deficiency anemia, unspecified: Secondary | ICD-10-CM | POA: Diagnosis not present

## 2016-10-20 DIAGNOSIS — N186 End stage renal disease: Secondary | ICD-10-CM | POA: Diagnosis not present

## 2016-10-20 DIAGNOSIS — D631 Anemia in chronic kidney disease: Secondary | ICD-10-CM | POA: Diagnosis not present

## 2016-10-22 DIAGNOSIS — Z23 Encounter for immunization: Secondary | ICD-10-CM | POA: Diagnosis not present

## 2016-10-22 DIAGNOSIS — N2581 Secondary hyperparathyroidism of renal origin: Secondary | ICD-10-CM | POA: Diagnosis not present

## 2016-10-22 DIAGNOSIS — D631 Anemia in chronic kidney disease: Secondary | ICD-10-CM | POA: Diagnosis not present

## 2016-10-22 DIAGNOSIS — D509 Iron deficiency anemia, unspecified: Secondary | ICD-10-CM | POA: Diagnosis not present

## 2016-10-22 DIAGNOSIS — N186 End stage renal disease: Secondary | ICD-10-CM | POA: Diagnosis not present

## 2016-10-22 DIAGNOSIS — I4891 Unspecified atrial fibrillation: Secondary | ICD-10-CM | POA: Diagnosis not present

## 2016-10-25 DIAGNOSIS — D631 Anemia in chronic kidney disease: Secondary | ICD-10-CM | POA: Diagnosis not present

## 2016-10-25 DIAGNOSIS — N2581 Secondary hyperparathyroidism of renal origin: Secondary | ICD-10-CM | POA: Diagnosis not present

## 2016-10-25 DIAGNOSIS — I4891 Unspecified atrial fibrillation: Secondary | ICD-10-CM | POA: Diagnosis not present

## 2016-10-25 DIAGNOSIS — N186 End stage renal disease: Secondary | ICD-10-CM | POA: Diagnosis not present

## 2016-10-25 DIAGNOSIS — D509 Iron deficiency anemia, unspecified: Secondary | ICD-10-CM | POA: Diagnosis not present

## 2016-10-25 DIAGNOSIS — Z23 Encounter for immunization: Secondary | ICD-10-CM | POA: Diagnosis not present

## 2016-10-26 DIAGNOSIS — N2581 Secondary hyperparathyroidism of renal origin: Secondary | ICD-10-CM | POA: Diagnosis not present

## 2016-10-26 DIAGNOSIS — I4891 Unspecified atrial fibrillation: Secondary | ICD-10-CM | POA: Diagnosis not present

## 2016-10-26 DIAGNOSIS — Z23 Encounter for immunization: Secondary | ICD-10-CM | POA: Diagnosis not present

## 2016-10-26 DIAGNOSIS — N186 End stage renal disease: Secondary | ICD-10-CM | POA: Diagnosis not present

## 2016-10-26 DIAGNOSIS — D509 Iron deficiency anemia, unspecified: Secondary | ICD-10-CM | POA: Diagnosis not present

## 2016-10-26 DIAGNOSIS — D631 Anemia in chronic kidney disease: Secondary | ICD-10-CM | POA: Diagnosis not present

## 2016-10-27 ENCOUNTER — Encounter (HOSPITAL_COMMUNITY): Payer: Self-pay | Admitting: *Deleted

## 2016-10-27 ENCOUNTER — Emergency Department (HOSPITAL_COMMUNITY)
Admission: EM | Admit: 2016-10-27 | Discharge: 2016-10-27 | Disposition: A | Discharge status: Home / Self Care | Payer: Medicare Other | Attending: Emergency Medicine | Admitting: Emergency Medicine

## 2016-10-27 DIAGNOSIS — Z96641 Presence of right artificial hip joint: Secondary | ICD-10-CM | POA: Insufficient documentation

## 2016-10-27 DIAGNOSIS — Z01812 Encounter for preprocedural laboratory examination: Secondary | ICD-10-CM | POA: Insufficient documentation

## 2016-10-27 DIAGNOSIS — Z79899 Other long term (current) drug therapy: Secondary | ICD-10-CM | POA: Insufficient documentation

## 2016-10-27 DIAGNOSIS — D631 Anemia in chronic kidney disease: Secondary | ICD-10-CM | POA: Diagnosis not present

## 2016-10-27 DIAGNOSIS — I132 Hypertensive heart and chronic kidney disease with heart failure and with stage 5 chronic kidney disease, or end stage renal disease: Secondary | ICD-10-CM | POA: Diagnosis not present

## 2016-10-27 DIAGNOSIS — I4891 Unspecified atrial fibrillation: Secondary | ICD-10-CM | POA: Diagnosis not present

## 2016-10-27 DIAGNOSIS — Z7901 Long term (current) use of anticoagulants: Secondary | ICD-10-CM | POA: Diagnosis not present

## 2016-10-27 DIAGNOSIS — Z0000 Encounter for general adult medical examination without abnormal findings: Secondary | ICD-10-CM | POA: Diagnosis not present

## 2016-10-27 DIAGNOSIS — Z992 Dependence on renal dialysis: Secondary | ICD-10-CM | POA: Insufficient documentation

## 2016-10-27 DIAGNOSIS — Z0189 Encounter for other specified special examinations: Secondary | ICD-10-CM

## 2016-10-27 DIAGNOSIS — I5042 Chronic combined systolic (congestive) and diastolic (congestive) heart failure: Secondary | ICD-10-CM | POA: Diagnosis not present

## 2016-10-27 DIAGNOSIS — E1122 Type 2 diabetes mellitus with diabetic chronic kidney disease: Secondary | ICD-10-CM | POA: Insufficient documentation

## 2016-10-27 DIAGNOSIS — N186 End stage renal disease: Secondary | ICD-10-CM | POA: Insufficient documentation

## 2016-10-27 DIAGNOSIS — Z96651 Presence of right artificial knee joint: Secondary | ICD-10-CM | POA: Insufficient documentation

## 2016-10-27 DIAGNOSIS — Z23 Encounter for immunization: Secondary | ICD-10-CM | POA: Diagnosis not present

## 2016-10-27 DIAGNOSIS — D509 Iron deficiency anemia, unspecified: Secondary | ICD-10-CM | POA: Diagnosis not present

## 2016-10-27 DIAGNOSIS — N2581 Secondary hyperparathyroidism of renal origin: Secondary | ICD-10-CM | POA: Diagnosis not present

## 2016-10-27 LAB — COMPREHENSIVE METABOLIC PANEL
ALT: 15 U/L — AB (ref 17–63)
AST: 27 U/L (ref 15–41)
Albumin: 4.1 g/dL (ref 3.5–5.0)
Alkaline Phosphatase: 119 U/L (ref 38–126)
Anion gap: 13 (ref 5–15)
BILIRUBIN TOTAL: 0.4 mg/dL (ref 0.3–1.2)
BUN: 28 mg/dL — AB (ref 6–20)
CALCIUM: 9.5 mg/dL (ref 8.9–10.3)
CHLORIDE: 97 mmol/L — AB (ref 101–111)
CO2: 28 mmol/L (ref 22–32)
CREATININE: 5.56 mg/dL — AB (ref 0.61–1.24)
GFR, EST AFRICAN AMERICAN: 11 mL/min — AB (ref >60)
GFR, EST NON AFRICAN AMERICAN: 9 mL/min — AB (ref >60)
Glucose, Bld: 88 mg/dL (ref 65–99)
Potassium: 4.2 mmol/L (ref 3.5–5.1)
Sodium: 138 mmol/L (ref 135–145)
TOTAL PROTEIN: 8.5 g/dL — AB (ref 6.5–8.1)

## 2016-10-27 LAB — TYPE AND SCREEN
ABO/RH(D): O POS
ANTIBODY SCREEN: NEGATIVE

## 2016-10-27 LAB — CBC
HCT: 34.6 % — ABNORMAL LOW (ref 39.0–52.0)
Hemoglobin: 11.5 g/dL — ABNORMAL LOW (ref 13.0–17.0)
MCH: 31.8 pg (ref 26.0–34.0)
MCHC: 33.2 g/dL (ref 30.0–36.0)
MCV: 95.6 fL (ref 78.0–100.0)
PLATELETS: 187 10*3/uL (ref 150–400)
RBC: 3.62 MIL/uL — AB (ref 4.22–5.81)
RDW: 16 % — AB (ref 11.5–15.5)
WBC: 8.5 10*3/uL (ref 4.0–10.5)

## 2016-10-27 LAB — I-STAT CHEM 8, ED
BUN: 31 mg/dL — ABNORMAL HIGH (ref 6–20)
CALCIUM ION: 1.02 mmol/L — AB (ref 1.15–1.40)
Chloride: 100 mmol/L — ABNORMAL LOW (ref 101–111)
Creatinine, Ser: 5.8 mg/dL — ABNORMAL HIGH (ref 0.61–1.24)
GLUCOSE: 91 mg/dL (ref 65–99)
HCT: 34 % — ABNORMAL LOW (ref 39.0–52.0)
HEMOGLOBIN: 11.6 g/dL — AB (ref 13.0–17.0)
Potassium: 4.3 mmol/L (ref 3.5–5.1)
SODIUM: 137 mmol/L (ref 135–145)
TCO2: 31 mmol/L (ref 0–100)

## 2016-10-27 LAB — PROTIME-INR
INR: 2.44
PROTHROMBIN TIME: 27 s — AB (ref 11.4–15.2)

## 2016-10-27 NOTE — ED Triage Notes (Signed)
PT was sent from dialysis after completion for a blood transfusion and was told to get one unit of blood for hgb 6.8.  Pt tired from dialysis and states his blood pressure always runs low.

## 2016-10-27 NOTE — Discharge Instructions (Signed)
Please read attached information. If you experience any new or worsening signs or symptoms please return to the emergency room for evaluation. Please follow-up with your primary care provider or specialist as discussed.  °

## 2016-10-27 NOTE — ED Provider Notes (Signed)
[facility] DEPT Provider Note   CSN: [id number] Arrival date & time: 10/27/16  1310     History   Chief Complaint Chief Complaint  Patient presents with  . Anemia    HPI Bruce Cole is a 76 y.o. male.  HPI   76 year old male presents today from dialysis with reports of anemia.  Patient states that he had dialysis yesterday and then again today.  He reports that before dialysis the inform him that his hemoglobin was 6.8.  They stated that after his dialysis he would need to come to the emergency room for transfusion.  These labs were once that had been taken last week.  He notes that he has been feeling well, with no dizziness or lightheadedness.  Patient notes that he is always hypotensive after dialysis and has no complaints.  He denies any bleeding per rectum, abnormal bruising.  Patient taking warfarin.  Past Medical History:  Diagnosis Date  . Acute blood loss anemia   . Atrial fibrillation (HCC)    Chronic  . Chronic combined systolic and diastolic heart failure ([place])   . Colon polyp 2000  . Dysrhythmia    hx  . ESRD on hemodialysis ([place])    [place]  . Essential hypertension   . Gastritis and gastroduodenitis   . GI bleed   . Gout   . Heart murmur   . History of blood transfusion   . History of kidney stones   . Nephrolithiasis   . OSA (obstructive sleep apnea) 09/02/2013    IMPRESSION :  1. Mild obstructive sleep apnea with hypopneas causing sleep fragmentation and moderate oxygen desaturation.  2. Short runs of nonsustained VT were noted. His beta blocker may need to be titrated 3. Significant PLM's were noted, the PLM arousal index was low. Please correlate with clinical history of restless leg syndrome.  4. Sleep efficiency was poor.  RECOMMENDATION:  1. Treatment options for this degree of sleep disordered breathing include weight loss and positional therapy to avoid supine sleep 2. Consider titrating beta blocker further, defer to  cardiologist 3. Patient should be cautioned against driving when sleepy.They should be asked to avoid medications with sedative side effects     . Osteoarthritis of right hip   . Pneumonia    hx 30 yrs ago  . Primary osteoarthritis of right hip   . Rheumatoid arthritis ([place])   . Thrombocytopenia [facility])     Patient Active Problem List   Diagnosis Date Noted  . Hyperkalemia 08/01/2016  . Acute hyperkalemia 07/16/2016  . Aftercare following surgery of the circulatory system 06/17/2016  . Status post total replacement of right hip 03/22/2016  . ESRD on dialysis ([place]) 11/27/2015  . Encounter for therapeutic drug monitoring 09/02/2013  . OSA (obstructive sleep apnea) 09/02/2013  . Chronic combined systolic and diastolic heart failure ([place]) 03/16/2013  . Allergic rhinitis 12/18/2012  . Long term current use of anticoagulant therapy 11/25/2010  . Genital herpes 05/31/2010  . DM (diabetes mellitus), type 2 with renal complications ([place]) 78/46/9629  . ERECTILE DYSFUNCTION, ORGANIC 09/21/2009  . Osteoarthritis 09/21/2009  . PERSONAL HX COLONIC POLYPS 08/25/2009  . Gout 07/22/2009  . Essential hypertension 07/22/2009  . ATRIAL FIBRILLATION 07/22/2009  . Rheumatoid arthritis ([place]) 07/22/2009  . NEPHROLITHIASIS, HX OF 07/22/2009    Past Surgical History:  Procedure Laterality Date  . AV FISTULA PLACEMENT Left 08/26/2015   Procedure: LEFT RADIOCEPHALIC FISTULA CREATION;  Surgeon: Rosetta Posner, MD;  Location: [facility]  OR;  Service: Vascular;  Laterality: Left;  . AV FISTULA PLACEMENT Left 11/23/2015   Procedure: ARTERIOVENOUS (AV) FISTULA CREATION;  Surgeon: Rosetta Posner, MD;  Location: [facility];  Service: Vascular;  Laterality: Left;  . BACK SURGERY     x2- discectomy  . CHOLECYSTECTOMY  1994  . CYSTOSCOPY/RETROGRADE/URETEROSCOPY/STONE EXTRACTION WITH BASKET    . ESOPHAGOGASTRODUODENOSCOPY N/A 11/13/2015   Procedure: ESOPHAGOGASTRODUODENOSCOPY (EGD);  Surgeon: Irene Shipper, MD;  Location: [facility]  ENDOSCOPY;  Service: Endoscopy;  Laterality: N/A;  . INSERTION OF DIALYSIS CATHETER Left 11/23/2015   Procedure: INSERTION OF DIALYSIS CATHETER;  Surgeon: Rosetta Posner, MD;  Location: [place];  Service: Vascular;  Laterality: Left;  . IR GENERIC HISTORICAL Left 08/04/2016   IR DIALY SHUNT INTRO NEEDLE/INTRACATH INITIAL W/IMG LEFT 08/04/2016 Markus Daft, MD [facility]-INTER[facility] RAD  . IR GENERIC HISTORICAL  08/04/2016   IR US GUIDE VASC ACCESS LEFT 08/04/2016 Markus Daft, MD [facility]  . JOINT REPLACEMENT     Total L-Hip replacement, Right Knee 10/20/09  . LEFT AND RIGHT HEART CATHETERIZATION WITH CORONARY ANGIOGRAM N/A 02/22/2013   Procedure: LEFT AND RIGHT HEART CATHETERIZATION WITH CORONARY ANGIOGRAM;  Surgeon: Jolaine Artist, MD;  Location: [facility] CATH LAB;  Service: Cardiovascular;  Laterality: N/A;  . LITHOTRIPSY  90's  . PERIPHERAL VASCULAR CATHETERIZATION Left 11/19/2015   Procedure: A/V/Fistulagram;  Surgeon: Conrad Scranton, MD;  Location: [place] CV LAB;  Service: Cardiovascular;  Laterality: Left;  . PERIPHERAL VASCULAR CATHETERIZATION Left 07/21/2016   Procedure: A/V Fistulagram;  Surgeon: Conrad Republic, MD;  Location: [place] CV LAB;  Service: Cardiovascular;  Laterality: Left;  arm  . REVISON OF ARTERIOVENOUS FISTULA Left 05/30/2016   Procedure: REVISION LEFT UPPER ARM FISTULA;  Surgeon: Conrad Walbridge, MD;  Location: [place];  Service: Vascular;  Laterality: Left;  . REVISON OF ARTERIOVENOUS FISTULA Left 07/22/2016   Procedure: REVISON OF BASILIC VEIN TRANSPOSITION ANASTOMOSIS;  Surgeon: Rosetta Posner, MD;  Location: [place];  Service: Vascular;  Laterality: Left;  . SPINE SURGERY     x 2  . TOTAL HIP ARTHROPLASTY Right 03/22/2016   Procedure: RIGHT TOTAL HIP ARTHROPLASTY ANTERIOR APPROACH;  Surgeon: Mcarthur Rossetti, MD;  Location: [place];  Service: Orthopedics;  Laterality: Right;       Home Medications    Prior to Admission medications   Medication Sig Start Date End Date Taking? Authorizing  Provider  acetaminophen (TYLENOL) 500 MG tablet Take 500 mg by mouth every 6 (six) hours as needed for mild pain.    Historical Provider, MD  albuterol (PROVENTIL) (2.5 MG/3ML) 0.083% nebulizer solution Take 2.5 mg by nebulization every 4 (four) hours as needed for shortness of breath. Reported on 09/25/2015    Historical Provider, MD  B Complex-C-Folic Acid (RENA-VITE RX) 1 MG TABS Take 1 tablet by mouth at bedtime.  02/18/16   Historical Provider, MD  fluticasone (FLONASE) 50 MCG/ACT nasal spray Place 2 sprays into both nostrils daily. Patient taking differently: Place 2 sprays into both nostrils daily as needed for allergies.  03/18/15   Debbrah Alar, NP  gabapentin (NEURONTIN) 100 MG capsule Take 100 mg by mouth 2 (two) times daily.    Historical Provider, MD  hydroxychloroquine (PLAQUENIL) 200 MG tablet TAKE 1 TABLET(200 MG) BY MOUTH TWICE DAILY 09/26/16   Naitik Panwala, PA-C  RENVELA 800 MG tablet Take 3 tablets (2,400 mg total) by mouth 3 (three) times daily with meals. 07/23/16   Lavina Hamman, MD  warfarin (COUMADIN) 7.5 MG tablet Take 7.5 mg by mouth daily.    Historical Provider, MD    Family History Family History  Problem Relation Age of Onset  . Hypertension Mother   . Arthritis Mother     ?RA  . Hypertension Father     Social History Social History  Substance Use Topics  . Smoking status: Never Smoker  . Smokeless tobacco: Never Used  . Alcohol use No     Comment: occasional     Allergies   Ace inhibitors   Review of Systems Review of Systems  All other systems reviewed and are negative.    Physical Exam Updated Vital Signs BP (!) 83/52 (BP Location: Right Arm)   Pulse 62   Temp 98.3 F (36.8 C) (Oral)   Resp 18   Ht 6\' 3"  (1.905 m)   Wt 104.3 kg   SpO2 100%   BMI 28.75 kg/m   Physical Exam  Constitutional: He is oriented to person, place, and time. He appears well-developed and well-nourished.  HENT:  Head: Normocephalic and atraumatic.    Eyes: Conjunctivae are normal. Pupils are equal, round, and reactive to light. Right eye exhibits no discharge. Left eye exhibits no discharge. No scleral icterus.  Neck: Normal range of motion. No JVD present. No tracheal deviation present.  Pulmonary/Chest: Effort normal. No stridor.  Neurological: He is alert and oriented to person, place, and time. Coordination normal.  Psychiatric: He has a normal mood and affect. His behavior is normal. Judgment and thought content normal.  Nursing note and vitals reviewed.    ED Treatments / Results  Labs (all labs ordered are listed, but only abnormal results are displayed) Labs Reviewed  COMPREHENSIVE METABOLIC PANEL - Abnormal; Notable for the following:       Result Value   Chloride 97 (*)    BUN 28 (*)    Creatinine, Ser 5.56 (*)    Total Protein 8.5 (*)    ALT 15 (*)    GFR calc non Af Amer 9 (*)    GFR calc Af Amer 11 (*)    All other components within normal limits  CBC - Abnormal; Notable for the following:    RBC 3.62 (*)    Hemoglobin 11.5 (*)    HCT 34.6 (*)    RDW 16.0 (*)    All other components within normal limits  PROTIME-INR - Abnormal; Notable for the following:    Prothrombin Time 27.0 (*)    All other components within normal limits  I-STAT CHEM 8, ED - Abnormal; Notable for the following:    Chloride 100 (*)    BUN 31 (*)    Creatinine, Ser 5.80 (*)    Calcium, Ion 1.02 (*)    Hemoglobin 11.6 (*)    HCT 34.0 (*)    All other components within normal limits  TYPE AND SCREEN    EKG  EKG Interpretation None       Radiology No results found.  Procedures Procedures (including critical care time)  Medications Ordered in ED Medications - No data to display   Initial Impression / Assessment and Plan / ED Course  I have reviewed the triage vital signs and the nursing notes.  Pertinent labs & imaging results that were available during my care of the patient were reviewed by me and considered in my  medical decision making (see chart for details).      Final Clinical Impressions(s) / ED Diagnoses  Final diagnoses:  Laboratory test    76 year old male presents today at the request of dialysis center for blood transfusion.  Labs here show no significant anemia at 11.6.  Patient well-appearing with no other complaints.  He will be discharged home with lab results printed off and return precautions.  Patient had no further questions or concerns at the time discharge.  New Prescriptions New Prescriptions   No medications on file     Okey Regal, PA-C 10/27/16 [place], DO 10/28/16 1036

## 2016-10-28 ENCOUNTER — Ambulatory Visit (INDEPENDENT_AMBULATORY_CARE_PROVIDER_SITE_OTHER): Payer: Medicare Other

## 2016-10-28 ENCOUNTER — Telehealth: Payer: Self-pay | Admitting: Internal Medicine

## 2016-10-28 DIAGNOSIS — I482 Chronic atrial fibrillation, unspecified: Secondary | ICD-10-CM

## 2016-10-28 DIAGNOSIS — Z5181 Encounter for therapeutic drug level monitoring: Secondary | ICD-10-CM

## 2016-10-28 NOTE — Telephone Encounter (Signed)
INR result addressed see anticoagulation note in Epic

## 2016-10-28 NOTE — Telephone Encounter (Signed)
New message    Pt had his PTINR done at the hospital , if there is a problem please call him

## 2016-10-29 ENCOUNTER — Other Ambulatory Visit: Payer: Self-pay | Admitting: Rheumatology

## 2016-10-29 DIAGNOSIS — I4891 Unspecified atrial fibrillation: Secondary | ICD-10-CM | POA: Diagnosis not present

## 2016-10-29 DIAGNOSIS — N2581 Secondary hyperparathyroidism of renal origin: Secondary | ICD-10-CM | POA: Diagnosis not present

## 2016-10-29 DIAGNOSIS — Z23 Encounter for immunization: Secondary | ICD-10-CM | POA: Diagnosis not present

## 2016-10-29 DIAGNOSIS — D631 Anemia in chronic kidney disease: Secondary | ICD-10-CM | POA: Diagnosis not present

## 2016-10-29 DIAGNOSIS — N186 End stage renal disease: Secondary | ICD-10-CM | POA: Diagnosis not present

## 2016-10-29 DIAGNOSIS — D509 Iron deficiency anemia, unspecified: Secondary | ICD-10-CM | POA: Diagnosis not present

## 2016-10-31 NOTE — Telephone Encounter (Addendum)
Last visit 08/29/16 Next visit 01/30/17 Labs 10/27/16 Creat. 5.56 GFR 11 Improved from previous labs PLQ Eye Exam January 2018 WNL  Okay to refill PLQ?

## 2016-11-01 DIAGNOSIS — N2581 Secondary hyperparathyroidism of renal origin: Secondary | ICD-10-CM | POA: Diagnosis not present

## 2016-11-01 DIAGNOSIS — D509 Iron deficiency anemia, unspecified: Secondary | ICD-10-CM | POA: Diagnosis not present

## 2016-11-01 DIAGNOSIS — N186 End stage renal disease: Secondary | ICD-10-CM | POA: Diagnosis not present

## 2016-11-01 DIAGNOSIS — D631 Anemia in chronic kidney disease: Secondary | ICD-10-CM | POA: Diagnosis not present

## 2016-11-01 DIAGNOSIS — I4891 Unspecified atrial fibrillation: Secondary | ICD-10-CM | POA: Diagnosis not present

## 2016-11-01 DIAGNOSIS — Z23 Encounter for immunization: Secondary | ICD-10-CM | POA: Diagnosis not present

## 2016-11-03 DIAGNOSIS — N2581 Secondary hyperparathyroidism of renal origin: Secondary | ICD-10-CM | POA: Diagnosis not present

## 2016-11-03 DIAGNOSIS — D509 Iron deficiency anemia, unspecified: Secondary | ICD-10-CM | POA: Diagnosis not present

## 2016-11-03 DIAGNOSIS — N186 End stage renal disease: Secondary | ICD-10-CM | POA: Diagnosis not present

## 2016-11-03 DIAGNOSIS — I4891 Unspecified atrial fibrillation: Secondary | ICD-10-CM | POA: Diagnosis not present

## 2016-11-03 DIAGNOSIS — Z23 Encounter for immunization: Secondary | ICD-10-CM | POA: Diagnosis not present

## 2016-11-03 DIAGNOSIS — D631 Anemia in chronic kidney disease: Secondary | ICD-10-CM | POA: Diagnosis not present

## 2016-11-05 DIAGNOSIS — N2581 Secondary hyperparathyroidism of renal origin: Secondary | ICD-10-CM | POA: Diagnosis not present

## 2016-11-05 DIAGNOSIS — N186 End stage renal disease: Secondary | ICD-10-CM | POA: Diagnosis not present

## 2016-11-05 DIAGNOSIS — Z23 Encounter for immunization: Secondary | ICD-10-CM | POA: Diagnosis not present

## 2016-11-05 DIAGNOSIS — D509 Iron deficiency anemia, unspecified: Secondary | ICD-10-CM | POA: Diagnosis not present

## 2016-11-05 DIAGNOSIS — I4891 Unspecified atrial fibrillation: Secondary | ICD-10-CM | POA: Diagnosis not present

## 2016-11-05 DIAGNOSIS — D631 Anemia in chronic kidney disease: Secondary | ICD-10-CM | POA: Diagnosis not present

## 2016-11-08 DIAGNOSIS — N186 End stage renal disease: Secondary | ICD-10-CM | POA: Diagnosis not present

## 2016-11-08 DIAGNOSIS — Z23 Encounter for immunization: Secondary | ICD-10-CM | POA: Diagnosis not present

## 2016-11-08 DIAGNOSIS — N2581 Secondary hyperparathyroidism of renal origin: Secondary | ICD-10-CM | POA: Diagnosis not present

## 2016-11-08 DIAGNOSIS — D631 Anemia in chronic kidney disease: Secondary | ICD-10-CM | POA: Diagnosis not present

## 2016-11-08 DIAGNOSIS — D509 Iron deficiency anemia, unspecified: Secondary | ICD-10-CM | POA: Diagnosis not present

## 2016-11-08 DIAGNOSIS — I4891 Unspecified atrial fibrillation: Secondary | ICD-10-CM | POA: Diagnosis not present

## 2016-11-10 DIAGNOSIS — Z23 Encounter for immunization: Secondary | ICD-10-CM | POA: Diagnosis not present

## 2016-11-10 DIAGNOSIS — I4891 Unspecified atrial fibrillation: Secondary | ICD-10-CM | POA: Diagnosis not present

## 2016-11-10 DIAGNOSIS — N2581 Secondary hyperparathyroidism of renal origin: Secondary | ICD-10-CM | POA: Diagnosis not present

## 2016-11-10 DIAGNOSIS — D631 Anemia in chronic kidney disease: Secondary | ICD-10-CM | POA: Diagnosis not present

## 2016-11-10 DIAGNOSIS — N186 End stage renal disease: Secondary | ICD-10-CM | POA: Diagnosis not present

## 2016-11-10 DIAGNOSIS — D509 Iron deficiency anemia, unspecified: Secondary | ICD-10-CM | POA: Diagnosis not present

## 2016-11-11 DIAGNOSIS — N186 End stage renal disease: Secondary | ICD-10-CM | POA: Diagnosis not present

## 2016-11-11 DIAGNOSIS — N2581 Secondary hyperparathyroidism of renal origin: Secondary | ICD-10-CM | POA: Diagnosis not present

## 2016-11-11 DIAGNOSIS — D631 Anemia in chronic kidney disease: Secondary | ICD-10-CM | POA: Diagnosis not present

## 2016-11-11 DIAGNOSIS — Z23 Encounter for immunization: Secondary | ICD-10-CM | POA: Diagnosis not present

## 2016-11-11 DIAGNOSIS — D509 Iron deficiency anemia, unspecified: Secondary | ICD-10-CM | POA: Diagnosis not present

## 2016-11-11 DIAGNOSIS — I4891 Unspecified atrial fibrillation: Secondary | ICD-10-CM | POA: Diagnosis not present

## 2016-11-11 LAB — PROTIME-INR: INR: 1.9 — AB (ref 0.9–1.1)

## 2016-11-11 NOTE — Progress Notes (Deleted)
Pre visit review using our clinic review tool, if applicable. No additional management support is needed unless otherwise documented below in the visit note. 

## 2016-11-11 NOTE — Progress Notes (Deleted)
Subjective:   Bruce Cole is a 76 y.o. male who presents for Medicare Annual/Subsequent preventive examination.  Review of Systems:  No ROS.  Medicare Wellness Visit.   Sleep patterns: {SX; SLEEP PATTERNS:18802::"feels rested on waking","does not get up to void","gets up *** times nightly to void","sleeps *** hours nightly"}.   Home Safety/Smoke Alarms:   Living environment; residence and Firearm Safety: {Rehab home environment / accessibility:30080::"no firearms","firearms stored safely"}. Seat Belt Safety/Bike Helmet: Wears seat belt.   Counseling:   Eye Exam-  Dental-  Male:   CCS-  Last 09/07/09:  Normal PSA-  Lab Results  Component Value Date   PSA 0.58 01/30/2012   PSA 0.43 07/24/2009       Objective:    Vitals: There were no vitals taken for this visit.  There is no height or weight on file to calculate BMI.  Tobacco History  Smoking Status  . Never Smoker  Smokeless Tobacco  . Never Used     Counseling given: Not Answered   Past Medical History:  Diagnosis Date  . Acute blood loss anemia   . Atrial fibrillation (HCC)    Chronic  . Chronic combined systolic and diastolic heart failure ([place])   . Colon polyp 2000  . Dysrhythmia    hx  . ESRD on hemodialysis ([place])    [place]  . Essential hypertension   . Gastritis and gastroduodenitis   . GI bleed   . Gout   . Heart murmur   . History of blood transfusion   . History of kidney stones   . Nephrolithiasis   . OSA (obstructive sleep apnea) 09/02/2013    IMPRESSION :  1. Mild obstructive sleep apnea with hypopneas causing sleep fragmentation and moderate oxygen desaturation.  2. Short runs of nonsustained VT were noted. His beta blocker may need to be titrated 3. Significant PLM's were noted, the PLM arousal index was low. Please correlate with clinical history of restless leg syndrome.  4. Sleep efficiency was poor.  RECOMMENDATION:  1. Treatment options for this degree of sleep  disordered breathing include weight loss and positional therapy to avoid supine sleep 2. Consider titrating beta blocker further, defer to cardiologist 3. Patient should be cautioned against driving when sleepy.They should be asked to avoid medications with sedative side effects     . Osteoarthritis of right hip   . Pneumonia    hx 30 yrs ago  . Primary osteoarthritis of right hip   . Rheumatoid arthritis ([place])   . Thrombocytopenia ([place])    Past Surgical History:  Procedure Laterality Date  . AV FISTULA PLACEMENT Left 08/26/2015   Procedure: LEFT RADIOCEPHALIC FISTULA CREATION;  Surgeon: Rosetta Posner, MD;  Location: [place];  Service: Vascular;  Laterality: Left;  . AV FISTULA PLACEMENT Left 11/23/2015   Procedure: ARTERIOVENOUS (AV) FISTULA CREATION;  Surgeon: Rosetta Posner, MD;  Location: [facility];  Service: Vascular;  Laterality: Left;  . BACK SURGERY     x2- discectomy  . CHOLECYSTECTOMY  1994  . CYSTOSCOPY/RETROGRADE/URETEROSCOPY/STONE EXTRACTION WITH BASKET    . ESOPHAGOGASTRODUODENOSCOPY N/A 11/13/2015   Procedure: ESOPHAGOGASTRODUODENOSCOPY (EGD);  Surgeon: Irene Shipper, MD;  Location[facility] ENDOSCOPY;  Service: Endoscopy;  Laterality: N/A;  . INSERTION OF DIALYSIS CATHETER Left 11/23/2015   Procedure: INSERTION OF DIALYSIS CATHETER;  Surgeon: Rosetta Posner, MD;  Location: [place];  Service: Vascular;  Laterality: Left;  . IR GENERIC HISTORICAL Left 08/04/2016   IR DIALY SHUNT  INTRO NEEDLE/INTRACATH INITIAL W/IMG LEFT 08/04/2016 Markus Daft, MD [facility]INTER[facility]  . IR GENERIC HISTORICAL  08/04/2016   IR US GUIDE VASC ACCESS LEFT 08/04/2016 Markus Daft, MD [facility]  . JOINT REPLACEMENT     Total L-Hip replacement, Right Knee 10/20/09  . LEFT AND RIGHT HEART CATHETERIZATION WITH CORONARY ANGIOGRAM N/A 02/22/2013   Procedure: LEFT AND RIGHT HEART CATHETERIZATION WITH CORONARY ANGIOGRAM;  Surgeon: Jolaine Artist, MD;  Location: [facility] CATH LAB;  Service: Cardiovascular;  Laterality: N/A;  . LITHOTRIPSY  90's    . PERIPHERAL VASCULAR CATHETERIZATION Left 11/19/2015   Procedure: A/V/Fistulagram;  Surgeon: Conrad Porterdale, MD;  Location: [place] CV LAB;  Service: Cardiovascular;  Laterality: Left;  . PERIPHERAL VASCULAR CATHETERIZATION Left 07/21/2016   Procedure: A/V Fistulagram;  Surgeon: Conrad Goodlow, MD;  Location: [place] CV LAB;  Service: Cardiovascular;  Laterality: Left;  arm  . REVISON OF ARTERIOVENOUS FISTULA Left 05/30/2016   Procedure: REVISION LEFT UPPER ARM FISTULA;  Surgeon: Conrad Ganado, MD;  Location: [place];  Service: Vascular;  Laterality: Left;  . REVISON OF ARTERIOVENOUS FISTULA Left 07/22/2016   Procedure: REVISON OF BASILIC VEIN TRANSPOSITION ANASTOMOSIS;  Surgeon: Rosetta Posner, MD;  Location: [place];  Service: Vascular;  Laterality: Left;  . SPINE SURGERY     x 2  . TOTAL HIP ARTHROPLASTY Right 03/22/2016   Procedure: RIGHT TOTAL HIP ARTHROPLASTY ANTERIOR APPROACH;  Surgeon: Mcarthur Rossetti, MD;  Location: [place];  Service: Orthopedics;  Laterality: Right;   Family History  Problem Relation Age of Onset  . Hypertension Mother   . Arthritis Mother     ?RA  . Hypertension Father    History  Sexual Activity  . Sexual activity: Not on file    Outpatient Encounter Prescriptions as of 11/14/2016  Medication Sig  . acetaminophen (TYLENOL) 500 MG tablet Take 500 mg by mouth every 6 (six) hours as needed for mild pain.  [name] albuterol (PROVENTIL) (2.5 MG/3ML) 0.083% nebulizer solution Take 2.5 mg by nebulization every 4 (four) hours as needed for shortness of breath. Reported on 09/25/2015  . B Complex-C-Folic Acid (RENA-VITE RX) 1 MG TABS Take 1 tablet by mouth at bedtime.   . fluticasone (FLONASE) 50 MCG/ACT nasal spray Place 2 sprays into both nostrils daily. (Patient taking differently: Place 2 sprays into both nostrils daily as needed for allergies. )  . gabapentin (NEURONTIN) 100 MG capsule Take 100 mg by mouth 2 (two) times daily.  . hydroxychloroquine (PLAQUENIL) 200 MG  tablet TAKE 1 TABLET(200 MG) BY MOUTH TWICE DAILY  . RENVELA 800 MG tablet Take 3 tablets (2,400 mg total) by mouth 3 (three) times daily with meals.  . warfarin (COUMADIN) 7.5 MG tablet Take 7.5 mg by mouth daily.   No facility-administered encounter medications on file as of 11/14/2016.     Activities of Daily Living In your present state of health, do you have any difficulty performing the following activities: 08/01/2016 07/16/2016  Hearing? N N  Vision? N N  Difficulty concentrating or making decisions? N N  Walking or climbing stairs? N N  Dressing or bathing? N N  Doing errands, shopping? Y N  Preparing Food and eating ? - -  Using the Toilet? - -  In the past six months, have you accidently leaked urine? - -  Do you have problems with loss of bowel control? - -  Managing your Medications? - -  Managing your Finances? - -  Housekeeping  or managing your Housekeeping? - -  Some recent data might be hidden    Patient Care Team: Debbrah Alar, NP as PCP - General (Internal Medicine) Estanislado Emms, MD as Consulting Physician (Nephrology) Mcarthur Rossetti, MD as Consulting Physician (Orthopedic Surgery) Jolaine Artist, MD as Consulting Physician (Cardiology) [facility]   Assessment:    Physical assessment deferred to PCP.  Exercise Activities and Dietary recommendations   Diet (meal preparation, eat out, water intake, caffeinated beverages, dairy products, fruits and vegetables): {Desc; diets:16563} Breakfast: Lunch:  Dinner:      Goals    None     Fall Risk Fall Risk  06/17/2016 12/03/2015 10/28/2015 10/27/2015 09/28/2015  Falls in the past year? No Yes No No No  Number falls in past yr: - 2 or more - - -  Injury with Fall? - Yes - - -  Risk Factor Category  - High Fall Risk - - -  Risk for fall due to : - History of fall(s);Impaired balance/gait;Impaired mobility - - -  Follow up - Education provided;Falls prevention discussed - - -    Depression Screen PHQ 2/9 Scores 06/17/2016 12/03/2015 10/28/2015 10/27/2015  PHQ - 2 Score 0 0 0 0    Cognitive Function        Immunization History  Administered Date(s) Administered  . Influenza Split 05/07/2012  . Influenza, High Dose Seasonal PF 06/03/2013  . Influenza,inj,Quad PF,36+ Mos 03/17/2014, 05/05/2015  . Influenza-Unspecified 05/20/2016  . PPD Test 11/25/2015  . Pneumococcal Conjugate-13 12/04/2013  . Pneumococcal Polysaccharide-23 09/20/2010  . Tdap 07/19/2011   Screening Tests Health Maintenance  Topic Date Due  . FOOT EXAM  09/07/2015  . URINE MICROALBUMIN  12/16/2015  . OPHTHALMOLOGY EXAM  07/20/2016  . HEMOGLOBIN A1C  01/30/2017  . INFLUENZA VACCINE  02/15/2017  . COLONOSCOPY  09/08/2019  . TETANUS/TDAP  07/18/2021  . PNA vac Low Risk Adult  Completed      Plan:   ***  I have personally reviewed and noted the following in the patient's chart:   . Medical and social history . Use of alcohol, tobacco or illicit drugs  . Current medications and supplements . Functional ability and status . Nutritional status . Physical activity . Advanced directives . List of other physicians . Vitals . Screenings to include cognitive, depression, and falls . Referrals and appointments  In addition, I have reviewed and discussed with patient certain preventive protocols, quality metrics, and best practice recommendations. A written personalized care plan for preventive services as well as general preventive health recommendations were provided to patient.     Naaman Plummer [place], [place]  11/11/2016

## 2016-11-12 DIAGNOSIS — D509 Iron deficiency anemia, unspecified: Secondary | ICD-10-CM | POA: Diagnosis not present

## 2016-11-12 DIAGNOSIS — Z23 Encounter for immunization: Secondary | ICD-10-CM | POA: Diagnosis not present

## 2016-11-12 DIAGNOSIS — N186 End stage renal disease: Secondary | ICD-10-CM | POA: Diagnosis not present

## 2016-11-12 DIAGNOSIS — I4891 Unspecified atrial fibrillation: Secondary | ICD-10-CM | POA: Diagnosis not present

## 2016-11-12 DIAGNOSIS — N2581 Secondary hyperparathyroidism of renal origin: Secondary | ICD-10-CM | POA: Diagnosis not present

## 2016-11-12 DIAGNOSIS — D631 Anemia in chronic kidney disease: Secondary | ICD-10-CM | POA: Diagnosis not present

## 2016-11-14 ENCOUNTER — Ambulatory Visit: Payer: Medicare Other | Admitting: Family

## 2016-11-14 ENCOUNTER — Encounter: Payer: Self-pay | Admitting: Family

## 2016-11-14 ENCOUNTER — Ambulatory Visit (INDEPENDENT_AMBULATORY_CARE_PROVIDER_SITE_OTHER): Payer: Medicare Other | Admitting: Family

## 2016-11-14 VITALS — BP 100/58 | HR 52 | Temp 98.2°F | Resp 18 | Ht 75.0 in | Wt 254.8 lb

## 2016-11-14 DIAGNOSIS — E1140 Type 2 diabetes mellitus with diabetic neuropathy, unspecified: Secondary | ICD-10-CM

## 2016-11-14 DIAGNOSIS — I482 Chronic atrial fibrillation, unspecified: Secondary | ICD-10-CM

## 2016-11-14 DIAGNOSIS — Z794 Long term (current) use of insulin: Secondary | ICD-10-CM

## 2016-11-14 DIAGNOSIS — E1122 Type 2 diabetes mellitus with diabetic chronic kidney disease: Secondary | ICD-10-CM

## 2016-11-14 DIAGNOSIS — N186 End stage renal disease: Secondary | ICD-10-CM | POA: Diagnosis not present

## 2016-11-14 DIAGNOSIS — Z992 Dependence on renal dialysis: Secondary | ICD-10-CM | POA: Diagnosis not present

## 2016-11-14 DIAGNOSIS — M0579 Rheumatoid arthritis with rheumatoid factor of multiple sites without organ or systems involvement: Secondary | ICD-10-CM | POA: Diagnosis not present

## 2016-11-14 DIAGNOSIS — I10 Essential (primary) hypertension: Secondary | ICD-10-CM

## 2016-11-14 DIAGNOSIS — I158 Other secondary hypertension: Secondary | ICD-10-CM | POA: Diagnosis not present

## 2016-11-14 LAB — LIPID PANEL
Cholesterol: 178 mg/dL (ref 0–200)
HDL: 73.2 mg/dL (ref >39.00)
LDL Cholesterol: 86 mg/dL (ref 0–99)
NONHDL: 104.87
Total CHOL/HDL Ratio: 2
Triglycerides: 95 mg/dL (ref 0.0–149.0)
VLDL: 19 mg/dL (ref 0.0–40.0)

## 2016-11-14 LAB — HEMOGLOBIN A1C: HEMOGLOBIN A1C: 6.3 % (ref 4.6–6.5)

## 2016-11-14 MED ORDER — ATORVASTATIN CALCIUM 10 MG PO TABS: 10.0000 mg | ORAL_TABLET | Freq: Every day | ORAL | 5 refills | Status: DC

## 2016-11-14 NOTE — Progress Notes (Signed)
Subjective:    Patient ID: Bruce Cole, male    DOB: 30-Sep-1940, 76 y.o.   MRN: [id number]  HPI   [name] is a 76 yr old male who presents today for follow up.  DM2- maintained on diabetic diet.  Reports healthy diet.  Lab Results  Component Value Date   HGBA1C 6.1 (H) 08/02/2016   HGBA1C 5.5 06/24/2016   HGBA1C 5.4 02/03/2016   Lab Results  Component Value Date   MICROALBUR 20.7 (H) 12/16/2014   LDLCALC 69 02/03/2016   CREATININE 5.80 (H) 10/27/2016   Lab Results  Component Value Date   CHOL 169 02/03/2016   HDL 87.90 02/03/2016   LDLCALC 69 02/03/2016   TRIG 58.0 02/03/2016   CHOLHDL 2 02/03/2016    Atrial fibrillation-maintained on coumadin. Continues at the coumadin clinic.  HTN- only on lasix. Reports that he still makes some urine.  BP Readings from Last 3 Encounters:  11/14/16 (!) 100/58  10/27/16 95/70  10/17/16 (!) 100/54   ESRD- maintained on HD.   RA/Gout- maintained on plaquenil. Followed by rheumatology, Dr. Carlyon Shadow.   Review of Systems See HPI   Past Medical History:  Diagnosis Date  . Acute blood loss anemia   . Atrial fibrillation (HCC)    Chronic  . Chronic combined systolic and diastolic heart failure ([place])   . Colon polyp 2000  . Dysrhythmia    hx  . ESRD on hemodialysis ([place])    [place]  . Essential hypertension   . Gastritis and gastroduodenitis   . GI bleed   . Gout   . Heart murmur   . History of blood transfusion   . History of kidney stones   . Nephrolithiasis   . OSA (obstructive sleep apnea) 09/02/2013    IMPRESSION :  1. Mild obstructive sleep apnea with hypopneas causing sleep fragmentation and moderate oxygen desaturation.  2. Short runs of nonsustained VT were noted. His beta blocker may need to be titrated 3. Significant PLM's were noted, the PLM arousal index was low. Please correlate with clinical history of restless leg syndrome.  4. Sleep efficiency was poor.  RECOMMENDATION:  1.  Treatment options for this degree of sleep disordered breathing include weight loss and positional therapy to avoid supine sleep 2. Consider titrating beta blocker further, defer to cardiologist 3. Patient should be cautioned against driving when sleepy.They should be asked to avoid medications with sedative side effects     . Osteoarthritis of right hip   . Pneumonia    hx 30 yrs ago  . Primary osteoarthritis of right hip   . Rheumatoid arthritis ([place])   . Thrombocytopenia [facility])      Social History   Social History  . Marital status: Single    Spouse name: N/A  . Number of children: 3  . Years of education: N/A   Occupational History  . retired Programmer, multimedia  Retired   Social History Main Topics  . Smoking status: Never Smoker  . Smokeless tobacco: Never Used  . Alcohol use No     Comment: occasional  . Drug use: No  . Sexual activity: Not on file   Other Topics Concern  . Not on file   Social History Narrative   Former [employer]   Admitted to [facility] 11/25/15   Widowed   Never smoked   FULL CODE    Past Surgical History:  Procedure Laterality Date  .  AV FISTULA PLACEMENT Left 08/26/2015   Procedure: LEFT RADIOCEPHALIC FISTULA CREATION;  Surgeon: Rosetta Posner, MD;  Location: [place];  Service: Vascular;  Laterality: Left;  . AV FISTULA PLACEMENT Left 11/23/2015   Procedure: ARTERIOVENOUS (AV) FISTULA CREATION;  Surgeon: Rosetta Posner, MD;  Location: [place];  Service: Vascular;  Laterality: Left;  . BACK SURGERY     x2- discectomy  . CHOLECYSTECTOMY  1994  . CYSTOSCOPY/RETROGRADE/URETEROSCOPY/STONE EXTRACTION WITH BASKET    . ESOPHAGOGASTRODUODENOSCOPY N/A 11/13/2015   Procedure: ESOPHAGOGASTRODUODENOSCOPY (EGD);  Surgeon: Irene Shipper, MD;  Location: [facility] ENDOSCOPY;  Service: Endoscopy;  Laterality: N/A;  . INSERTION OF DIALYSIS CATHETER Left 11/23/2015   Procedure: INSERTION OF DIALYSIS CATHETER;  Surgeon: Rosetta Posner, MD;  Location: [place];   Service: Vascular;  Laterality: Left;  . IR GENERIC HISTORICAL Left 08/04/2016   IR DIALY SHUNT INTRO NEEDLE/INTRACATH INITIAL W/IMG LEFT 08/04/2016 Markus Daft, MD [facility]-INTERV RAD  . IR GENERIC HISTORICAL  08/04/2016   IR US GUIDE VASC ACCESS LEFT 08/04/2016 Markus Daft, MD [facility]INTER[facility]  . JOINT REPLACEMENT     Total L-Hip replacement, Right Knee 10/20/09  . LEFT AND RIGHT HEART CATHETERIZATION WITH CORONARY ANGIOGRAM N/A 02/22/2013   Procedure: LEFT AND RIGHT HEART CATHETERIZATION WITH CORONARY ANGIOGRAM;  Surgeon: Jolaine Artist, MD;  Location: [facility] CATH LAB;  Service: Cardiovascular;  Laterality: N/A;  . LITHOTRIPSY  90's  . PERIPHERAL VASCULAR CATHETERIZATION Left 11/19/2015   Procedure: A/V/Fistulagram;  Surgeon: Conrad Merrillville, MD;  Location: [place] CV LAB;  Service: Cardiovascular;  Laterality: Left;  . PERIPHERAL VASCULAR CATHETERIZATION Left 07/21/2016   Procedure: A/V Fistulagram;  Surgeon: Conrad Muskogee, MD;  Location: [place] CV LAB;  Service: Cardiovascular;  Laterality: Left;  arm  . REVISON OF ARTERIOVENOUS FISTULA Left 05/30/2016   Procedure: REVISION LEFT UPPER ARM FISTULA;  Surgeon: Conrad Foxfire, MD;  Location: [place];  Service: Vascular;  Laterality: Left;  . REVISON OF ARTERIOVENOUS FISTULA Left 07/22/2016   Procedure: REVISON OF BASILIC VEIN TRANSPOSITION ANASTOMOSIS;  Surgeon: Rosetta Posner, MD;  Location: [place];  Service: Vascular;  Laterality: Left;  . SPINE SURGERY     x 2  . TOTAL HIP ARTHROPLASTY Right 03/22/2016   Procedure: RIGHT TOTAL HIP ARTHROPLASTY ANTERIOR APPROACH;  Surgeon: Mcarthur Rossetti, MD;  Location: [place];  Service: Orthopedics;  Laterality: Right;    Family History  Problem Relation Age of Onset  . Hypertension Mother   . Arthritis Mother     ?RA  . Hypertension Father     Allergies  Allergen Reactions  . Ace Inhibitors Other (See Comments)    Worsening renal insufficiency    Current Outpatient Prescriptions on File Prior to Visit  Medication  Sig Dispense Refill  . acetaminophen (TYLENOL) 500 MG tablet Take 500 mg by mouth every 6 (six) hours as needed for mild pain.    [name] albuterol (PROVENTIL) (2.5 MG/3ML) 0.083% nebulizer solution Take 2.5 mg by nebulization every 4 (four) hours as needed for shortness of breath. Reported on 09/25/2015    . B Complex-C-Folic Acid (RENA-VITE RX) 1 MG TABS Take 1 tablet by mouth at bedtime.   6  . fluticasone (FLONASE) 50 MCG/ACT nasal spray Place 2 sprays into both nostrils daily. (Patient taking differently: Place 2 sprays into both nostrils daily as needed for allergies. ) 16 g 6  . gabapentin (NEURONTIN) 100 MG capsule Take 100 mg by mouth 2 (two) times daily.    [name]  hydroxychloroquine (PLAQUENIL) 200 MG tablet TAKE 1 TABLET(200 MG) BY MOUTH TWICE DAILY 180 tablet 1  . RENVELA 800 MG tablet Take 3 tablets (2,400 mg total) by mouth 3 (three) times daily with meals. 90 tablet 0  . warfarin (COUMADIN) 7.5 MG tablet Take 7.5 mg by mouth daily.     No current facility-administered medications on file prior to visit.     BP (!) 100/58 (BP Location: Right Arm, Cuff Size: Large)   Pulse (!) 52   Temp 98.2 F (36.8 C) (Oral)   Resp 18   Ht 6\' 3"  (1.905 m)   Wt 254 lb 12.8 oz (115.6 kg)   SpO2 100% Comment: room air  BMI 31.85 kg/m       Objective:   Physical Exam  Constitutional: He is oriented to person, place, and time. He appears well-developed and well-nourished. No distress.  HENT:  Head: Normocephalic and atraumatic.  Cardiovascular: Normal rate and regular rhythm.   No murmur heard. Pulmonary/Chest: Effort normal and breath sounds normal. No respiratory distress. He has no wheezes. He has no rales.  Musculoskeletal: He exhibits no edema.  Neurological: He is alert and oriented to person, place, and time.  Skin: Skin is warm and dry.  Psychiatric: He has a normal mood and affect. His behavior is normal. Thought content normal.          Assessment & Plan:

## 2016-11-14 NOTE — Assessment & Plan Note (Signed)
Clinically stable. Management per rheumatology.  

## 2016-11-14 NOTE — Patient Instructions (Signed)
Please complete lab work prior to leaving. Begin lipitor.

## 2016-11-14 NOTE — Assessment & Plan Note (Signed)
Rate stable. Management of coumadin per coumadin clinic.

## 2016-11-14 NOTE — Progress Notes (Signed)
Pre visit review using our clinic review tool, if applicable. No additional management support is needed unless otherwise documented below in the visit note. 

## 2016-11-14 NOTE — Assessment & Plan Note (Signed)
Stable.  Continue lasix 

## 2016-11-15 DIAGNOSIS — D631 Anemia in chronic kidney disease: Secondary | ICD-10-CM | POA: Diagnosis not present

## 2016-11-15 DIAGNOSIS — N2581 Secondary hyperparathyroidism of renal origin: Secondary | ICD-10-CM | POA: Diagnosis not present

## 2016-11-15 DIAGNOSIS — E8770 Fluid overload, unspecified: Secondary | ICD-10-CM | POA: Diagnosis not present

## 2016-11-15 DIAGNOSIS — I4891 Unspecified atrial fibrillation: Secondary | ICD-10-CM | POA: Diagnosis not present

## 2016-11-15 DIAGNOSIS — N186 End stage renal disease: Secondary | ICD-10-CM | POA: Diagnosis not present

## 2016-11-15 DIAGNOSIS — D509 Iron deficiency anemia, unspecified: Secondary | ICD-10-CM | POA: Diagnosis not present

## 2016-11-17 DIAGNOSIS — N2581 Secondary hyperparathyroidism of renal origin: Secondary | ICD-10-CM | POA: Diagnosis not present

## 2016-11-17 DIAGNOSIS — D631 Anemia in chronic kidney disease: Secondary | ICD-10-CM | POA: Diagnosis not present

## 2016-11-17 DIAGNOSIS — I4891 Unspecified atrial fibrillation: Secondary | ICD-10-CM | POA: Diagnosis not present

## 2016-11-17 DIAGNOSIS — D509 Iron deficiency anemia, unspecified: Secondary | ICD-10-CM | POA: Diagnosis not present

## 2016-11-17 DIAGNOSIS — N186 End stage renal disease: Secondary | ICD-10-CM | POA: Diagnosis not present

## 2016-11-17 DIAGNOSIS — E8770 Fluid overload, unspecified: Secondary | ICD-10-CM | POA: Diagnosis not present

## 2016-11-18 DIAGNOSIS — N2581 Secondary hyperparathyroidism of renal origin: Secondary | ICD-10-CM | POA: Diagnosis not present

## 2016-11-18 DIAGNOSIS — E8770 Fluid overload, unspecified: Secondary | ICD-10-CM | POA: Diagnosis not present

## 2016-11-18 DIAGNOSIS — N186 End stage renal disease: Secondary | ICD-10-CM | POA: Diagnosis not present

## 2016-11-18 DIAGNOSIS — D509 Iron deficiency anemia, unspecified: Secondary | ICD-10-CM | POA: Diagnosis not present

## 2016-11-18 DIAGNOSIS — D631 Anemia in chronic kidney disease: Secondary | ICD-10-CM | POA: Diagnosis not present

## 2016-11-18 DIAGNOSIS — I4891 Unspecified atrial fibrillation: Secondary | ICD-10-CM | POA: Diagnosis not present

## 2016-11-21 DIAGNOSIS — N186 End stage renal disease: Secondary | ICD-10-CM | POA: Diagnosis not present

## 2016-11-21 DIAGNOSIS — D631 Anemia in chronic kidney disease: Secondary | ICD-10-CM | POA: Diagnosis not present

## 2016-11-21 DIAGNOSIS — N2581 Secondary hyperparathyroidism of renal origin: Secondary | ICD-10-CM | POA: Diagnosis not present

## 2016-11-21 DIAGNOSIS — D509 Iron deficiency anemia, unspecified: Secondary | ICD-10-CM | POA: Diagnosis not present

## 2016-11-21 DIAGNOSIS — E8770 Fluid overload, unspecified: Secondary | ICD-10-CM | POA: Diagnosis not present

## 2016-11-21 DIAGNOSIS — I4891 Unspecified atrial fibrillation: Secondary | ICD-10-CM | POA: Diagnosis not present

## 2016-11-22 DIAGNOSIS — E8770 Fluid overload, unspecified: Secondary | ICD-10-CM | POA: Diagnosis not present

## 2016-11-22 DIAGNOSIS — N186 End stage renal disease: Secondary | ICD-10-CM | POA: Diagnosis not present

## 2016-11-22 DIAGNOSIS — N2581 Secondary hyperparathyroidism of renal origin: Secondary | ICD-10-CM | POA: Diagnosis not present

## 2016-11-23 ENCOUNTER — Ambulatory Visit (INDEPENDENT_AMBULATORY_CARE_PROVIDER_SITE_OTHER): Payer: Medicare Other | Admitting: *Deleted

## 2016-11-23 DIAGNOSIS — I4891 Unspecified atrial fibrillation: Secondary | ICD-10-CM

## 2016-11-23 DIAGNOSIS — Z5181 Encounter for therapeutic drug level monitoring: Secondary | ICD-10-CM

## 2016-11-23 LAB — POCT INR: INR: 2.4

## 2016-11-24 DIAGNOSIS — E8770 Fluid overload, unspecified: Secondary | ICD-10-CM | POA: Diagnosis not present

## 2016-11-24 DIAGNOSIS — D631 Anemia in chronic kidney disease: Secondary | ICD-10-CM | POA: Diagnosis not present

## 2016-11-24 DIAGNOSIS — N2581 Secondary hyperparathyroidism of renal origin: Secondary | ICD-10-CM | POA: Diagnosis not present

## 2016-11-24 DIAGNOSIS — I4891 Unspecified atrial fibrillation: Secondary | ICD-10-CM | POA: Diagnosis not present

## 2016-11-24 DIAGNOSIS — N186 End stage renal disease: Secondary | ICD-10-CM | POA: Diagnosis not present

## 2016-11-24 DIAGNOSIS — D509 Iron deficiency anemia, unspecified: Secondary | ICD-10-CM | POA: Diagnosis not present

## 2016-11-25 ENCOUNTER — Other Ambulatory Visit: Payer: Self-pay | Admitting: Family

## 2016-11-25 NOTE — Telephone Encounter (Signed)
Received request from pharmacy for furosemide 40mg , Take 4 tablest by mouth twice daily. Current med list indicates furosemide 80mg  , 1 tablet twice a day on non dialysis days. Who should be prescribing?  Please advise?

## 2016-11-26 DIAGNOSIS — I4891 Unspecified atrial fibrillation: Secondary | ICD-10-CM | POA: Diagnosis not present

## 2016-11-26 DIAGNOSIS — N186 End stage renal disease: Secondary | ICD-10-CM | POA: Diagnosis not present

## 2016-11-26 DIAGNOSIS — D509 Iron deficiency anemia, unspecified: Secondary | ICD-10-CM | POA: Diagnosis not present

## 2016-11-26 DIAGNOSIS — N2581 Secondary hyperparathyroidism of renal origin: Secondary | ICD-10-CM | POA: Diagnosis not present

## 2016-11-26 DIAGNOSIS — E8770 Fluid overload, unspecified: Secondary | ICD-10-CM | POA: Diagnosis not present

## 2016-11-26 DIAGNOSIS — D631 Anemia in chronic kidney disease: Secondary | ICD-10-CM | POA: Diagnosis not present

## 2016-11-29 DIAGNOSIS — D509 Iron deficiency anemia, unspecified: Secondary | ICD-10-CM | POA: Diagnosis not present

## 2016-11-29 DIAGNOSIS — I4891 Unspecified atrial fibrillation: Secondary | ICD-10-CM | POA: Diagnosis not present

## 2016-11-29 DIAGNOSIS — N186 End stage renal disease: Secondary | ICD-10-CM | POA: Diagnosis not present

## 2016-11-29 DIAGNOSIS — N2581 Secondary hyperparathyroidism of renal origin: Secondary | ICD-10-CM | POA: Diagnosis not present

## 2016-11-29 DIAGNOSIS — D631 Anemia in chronic kidney disease: Secondary | ICD-10-CM | POA: Diagnosis not present

## 2016-11-29 DIAGNOSIS — E8770 Fluid overload, unspecified: Secondary | ICD-10-CM | POA: Diagnosis not present

## 2016-11-30 NOTE — Telephone Encounter (Signed)
Nephrology should be prescribing please.

## 2016-12-01 ENCOUNTER — Other Ambulatory Visit: Payer: Self-pay | Admitting: Family

## 2016-12-01 DIAGNOSIS — N2581 Secondary hyperparathyroidism of renal origin: Secondary | ICD-10-CM | POA: Diagnosis not present

## 2016-12-01 DIAGNOSIS — D631 Anemia in chronic kidney disease: Secondary | ICD-10-CM | POA: Diagnosis not present

## 2016-12-01 DIAGNOSIS — E8770 Fluid overload, unspecified: Secondary | ICD-10-CM | POA: Diagnosis not present

## 2016-12-01 DIAGNOSIS — D509 Iron deficiency anemia, unspecified: Secondary | ICD-10-CM | POA: Diagnosis not present

## 2016-12-01 DIAGNOSIS — I4891 Unspecified atrial fibrillation: Secondary | ICD-10-CM | POA: Diagnosis not present

## 2016-12-01 DIAGNOSIS — N186 End stage renal disease: Secondary | ICD-10-CM | POA: Diagnosis not present

## 2016-12-01 NOTE — Telephone Encounter (Signed)
[facility]-- pharmacy sent request for colchicine. Med was removed from list on 10/02/15 with note to stop taking at discharge.  Please advise?

## 2016-12-02 ENCOUNTER — Other Ambulatory Visit (INDEPENDENT_AMBULATORY_CARE_PROVIDER_SITE_OTHER): Payer: Self-pay | Admitting: Orthopaedic Surgery

## 2016-12-02 NOTE — Telephone Encounter (Signed)
Should not be given due to his being on dialysis.  No refill.

## 2016-12-02 NOTE — Telephone Encounter (Signed)
Please advise 

## 2016-12-03 DIAGNOSIS — N186 End stage renal disease: Secondary | ICD-10-CM | POA: Diagnosis not present

## 2016-12-06 DIAGNOSIS — I4891 Unspecified atrial fibrillation: Secondary | ICD-10-CM | POA: Diagnosis not present

## 2016-12-06 DIAGNOSIS — N186 End stage renal disease: Secondary | ICD-10-CM | POA: Diagnosis not present

## 2016-12-06 DIAGNOSIS — D631 Anemia in chronic kidney disease: Secondary | ICD-10-CM | POA: Diagnosis not present

## 2016-12-06 DIAGNOSIS — N2581 Secondary hyperparathyroidism of renal origin: Secondary | ICD-10-CM | POA: Diagnosis not present

## 2016-12-06 DIAGNOSIS — D509 Iron deficiency anemia, unspecified: Secondary | ICD-10-CM | POA: Diagnosis not present

## 2016-12-06 DIAGNOSIS — E8770 Fluid overload, unspecified: Secondary | ICD-10-CM | POA: Diagnosis not present

## 2016-12-06 NOTE — Telephone Encounter (Signed)
Denial sent to pharmacy.

## 2016-12-07 ENCOUNTER — Other Ambulatory Visit: Payer: Self-pay | Admitting: Internal Medicine

## 2016-12-07 ENCOUNTER — Ambulatory Visit (INDEPENDENT_AMBULATORY_CARE_PROVIDER_SITE_OTHER): Payer: Medicare Other | Admitting: Orthopaedic Surgery

## 2016-12-08 DIAGNOSIS — N2581 Secondary hyperparathyroidism of renal origin: Secondary | ICD-10-CM | POA: Diagnosis not present

## 2016-12-08 DIAGNOSIS — I4891 Unspecified atrial fibrillation: Secondary | ICD-10-CM | POA: Diagnosis not present

## 2016-12-08 DIAGNOSIS — D509 Iron deficiency anemia, unspecified: Secondary | ICD-10-CM | POA: Diagnosis not present

## 2016-12-08 DIAGNOSIS — E8770 Fluid overload, unspecified: Secondary | ICD-10-CM | POA: Diagnosis not present

## 2016-12-08 DIAGNOSIS — N186 End stage renal disease: Secondary | ICD-10-CM | POA: Diagnosis not present

## 2016-12-08 DIAGNOSIS — D631 Anemia in chronic kidney disease: Secondary | ICD-10-CM | POA: Diagnosis not present

## 2016-12-09 DIAGNOSIS — I4891 Unspecified atrial fibrillation: Secondary | ICD-10-CM | POA: Diagnosis not present

## 2016-12-09 DIAGNOSIS — D509 Iron deficiency anemia, unspecified: Secondary | ICD-10-CM | POA: Diagnosis not present

## 2016-12-09 DIAGNOSIS — E8770 Fluid overload, unspecified: Secondary | ICD-10-CM | POA: Diagnosis not present

## 2016-12-09 DIAGNOSIS — N186 End stage renal disease: Secondary | ICD-10-CM | POA: Diagnosis not present

## 2016-12-09 DIAGNOSIS — N2581 Secondary hyperparathyroidism of renal origin: Secondary | ICD-10-CM | POA: Diagnosis not present

## 2016-12-09 DIAGNOSIS — D631 Anemia in chronic kidney disease: Secondary | ICD-10-CM | POA: Diagnosis not present

## 2016-12-10 DIAGNOSIS — N186 End stage renal disease: Secondary | ICD-10-CM | POA: Diagnosis not present

## 2016-12-10 DIAGNOSIS — N2581 Secondary hyperparathyroidism of renal origin: Secondary | ICD-10-CM | POA: Diagnosis not present

## 2016-12-10 DIAGNOSIS — E8770 Fluid overload, unspecified: Secondary | ICD-10-CM | POA: Diagnosis not present

## 2016-12-10 DIAGNOSIS — D631 Anemia in chronic kidney disease: Secondary | ICD-10-CM | POA: Diagnosis not present

## 2016-12-10 DIAGNOSIS — I4891 Unspecified atrial fibrillation: Secondary | ICD-10-CM | POA: Diagnosis not present

## 2016-12-10 DIAGNOSIS — D509 Iron deficiency anemia, unspecified: Secondary | ICD-10-CM | POA: Diagnosis not present

## 2016-12-13 ENCOUNTER — Telehealth: Payer: Self-pay | Admitting: *Deleted

## 2016-12-13 DIAGNOSIS — D509 Iron deficiency anemia, unspecified: Secondary | ICD-10-CM | POA: Diagnosis not present

## 2016-12-13 DIAGNOSIS — N186 End stage renal disease: Secondary | ICD-10-CM | POA: Diagnosis not present

## 2016-12-13 DIAGNOSIS — N2581 Secondary hyperparathyroidism of renal origin: Secondary | ICD-10-CM | POA: Diagnosis not present

## 2016-12-13 DIAGNOSIS — D631 Anemia in chronic kidney disease: Secondary | ICD-10-CM | POA: Diagnosis not present

## 2016-12-13 DIAGNOSIS — I4891 Unspecified atrial fibrillation: Secondary | ICD-10-CM | POA: Diagnosis not present

## 2016-12-13 DIAGNOSIS — E8770 Fluid overload, unspecified: Secondary | ICD-10-CM | POA: Diagnosis not present

## 2016-12-13 NOTE — Telephone Encounter (Signed)
Received Abnormal lab results with critical values, forwarded to provider/SLS 05/29

## 2016-12-14 ENCOUNTER — Ambulatory Visit (INDEPENDENT_AMBULATORY_CARE_PROVIDER_SITE_OTHER): Payer: Medicare Other | Admitting: *Deleted

## 2016-12-14 DIAGNOSIS — I4891 Unspecified atrial fibrillation: Secondary | ICD-10-CM

## 2016-12-14 DIAGNOSIS — Z5181 Encounter for therapeutic drug level monitoring: Secondary | ICD-10-CM

## 2016-12-14 LAB — POCT INR: INR: 1.8

## 2016-12-15 DIAGNOSIS — N186 End stage renal disease: Secondary | ICD-10-CM | POA: Diagnosis not present

## 2016-12-15 DIAGNOSIS — N2581 Secondary hyperparathyroidism of renal origin: Secondary | ICD-10-CM | POA: Diagnosis not present

## 2016-12-15 DIAGNOSIS — E8770 Fluid overload, unspecified: Secondary | ICD-10-CM | POA: Diagnosis not present

## 2016-12-15 DIAGNOSIS — Z992 Dependence on renal dialysis: Secondary | ICD-10-CM | POA: Diagnosis not present

## 2016-12-15 DIAGNOSIS — D631 Anemia in chronic kidney disease: Secondary | ICD-10-CM | POA: Diagnosis not present

## 2016-12-15 DIAGNOSIS — I158 Other secondary hypertension: Secondary | ICD-10-CM | POA: Diagnosis not present

## 2016-12-15 DIAGNOSIS — I4891 Unspecified atrial fibrillation: Secondary | ICD-10-CM | POA: Diagnosis not present

## 2016-12-15 DIAGNOSIS — D509 Iron deficiency anemia, unspecified: Secondary | ICD-10-CM | POA: Diagnosis not present

## 2016-12-17 DIAGNOSIS — D631 Anemia in chronic kidney disease: Secondary | ICD-10-CM | POA: Diagnosis not present

## 2016-12-17 DIAGNOSIS — I4891 Unspecified atrial fibrillation: Secondary | ICD-10-CM | POA: Diagnosis not present

## 2016-12-17 DIAGNOSIS — N2581 Secondary hyperparathyroidism of renal origin: Secondary | ICD-10-CM | POA: Diagnosis not present

## 2016-12-17 DIAGNOSIS — N186 End stage renal disease: Secondary | ICD-10-CM | POA: Diagnosis not present

## 2016-12-17 DIAGNOSIS — D509 Iron deficiency anemia, unspecified: Secondary | ICD-10-CM | POA: Diagnosis not present

## 2016-12-20 DIAGNOSIS — N186 End stage renal disease: Secondary | ICD-10-CM | POA: Diagnosis not present

## 2016-12-20 DIAGNOSIS — D509 Iron deficiency anemia, unspecified: Secondary | ICD-10-CM | POA: Diagnosis not present

## 2016-12-20 DIAGNOSIS — I4891 Unspecified atrial fibrillation: Secondary | ICD-10-CM | POA: Diagnosis not present

## 2016-12-20 DIAGNOSIS — D631 Anemia in chronic kidney disease: Secondary | ICD-10-CM | POA: Diagnosis not present

## 2016-12-20 DIAGNOSIS — N2581 Secondary hyperparathyroidism of renal origin: Secondary | ICD-10-CM | POA: Diagnosis not present

## 2016-12-22 DIAGNOSIS — N2581 Secondary hyperparathyroidism of renal origin: Secondary | ICD-10-CM | POA: Diagnosis not present

## 2016-12-22 DIAGNOSIS — N186 End stage renal disease: Secondary | ICD-10-CM | POA: Diagnosis not present

## 2016-12-22 DIAGNOSIS — I4891 Unspecified atrial fibrillation: Secondary | ICD-10-CM | POA: Diagnosis not present

## 2016-12-22 DIAGNOSIS — D509 Iron deficiency anemia, unspecified: Secondary | ICD-10-CM | POA: Diagnosis not present

## 2016-12-22 DIAGNOSIS — D631 Anemia in chronic kidney disease: Secondary | ICD-10-CM | POA: Diagnosis not present

## 2016-12-24 DIAGNOSIS — D631 Anemia in chronic kidney disease: Secondary | ICD-10-CM | POA: Diagnosis not present

## 2016-12-24 DIAGNOSIS — I4891 Unspecified atrial fibrillation: Secondary | ICD-10-CM | POA: Diagnosis not present

## 2016-12-24 DIAGNOSIS — N186 End stage renal disease: Secondary | ICD-10-CM | POA: Diagnosis not present

## 2016-12-24 DIAGNOSIS — D509 Iron deficiency anemia, unspecified: Secondary | ICD-10-CM | POA: Diagnosis not present

## 2016-12-24 DIAGNOSIS — N2581 Secondary hyperparathyroidism of renal origin: Secondary | ICD-10-CM | POA: Diagnosis not present

## 2016-12-27 DIAGNOSIS — N2581 Secondary hyperparathyroidism of renal origin: Secondary | ICD-10-CM | POA: Diagnosis not present

## 2016-12-27 DIAGNOSIS — D631 Anemia in chronic kidney disease: Secondary | ICD-10-CM | POA: Diagnosis not present

## 2016-12-27 DIAGNOSIS — D509 Iron deficiency anemia, unspecified: Secondary | ICD-10-CM | POA: Diagnosis not present

## 2016-12-27 DIAGNOSIS — I4891 Unspecified atrial fibrillation: Secondary | ICD-10-CM | POA: Diagnosis not present

## 2016-12-27 DIAGNOSIS — N186 End stage renal disease: Secondary | ICD-10-CM | POA: Diagnosis not present

## 2016-12-29 DIAGNOSIS — D509 Iron deficiency anemia, unspecified: Secondary | ICD-10-CM | POA: Diagnosis not present

## 2016-12-29 DIAGNOSIS — I4891 Unspecified atrial fibrillation: Secondary | ICD-10-CM | POA: Diagnosis not present

## 2016-12-29 DIAGNOSIS — N186 End stage renal disease: Secondary | ICD-10-CM | POA: Diagnosis not present

## 2016-12-29 DIAGNOSIS — N2581 Secondary hyperparathyroidism of renal origin: Secondary | ICD-10-CM | POA: Diagnosis not present

## 2016-12-29 DIAGNOSIS — D631 Anemia in chronic kidney disease: Secondary | ICD-10-CM | POA: Diagnosis not present

## 2016-12-31 DIAGNOSIS — D509 Iron deficiency anemia, unspecified: Secondary | ICD-10-CM | POA: Diagnosis not present

## 2016-12-31 DIAGNOSIS — D631 Anemia in chronic kidney disease: Secondary | ICD-10-CM | POA: Diagnosis not present

## 2016-12-31 DIAGNOSIS — N186 End stage renal disease: Secondary | ICD-10-CM | POA: Diagnosis not present

## 2016-12-31 DIAGNOSIS — I4891 Unspecified atrial fibrillation: Secondary | ICD-10-CM | POA: Diagnosis not present

## 2016-12-31 DIAGNOSIS — N2581 Secondary hyperparathyroidism of renal origin: Secondary | ICD-10-CM | POA: Diagnosis not present

## 2017-01-03 DIAGNOSIS — I4891 Unspecified atrial fibrillation: Secondary | ICD-10-CM | POA: Diagnosis not present

## 2017-01-03 DIAGNOSIS — D631 Anemia in chronic kidney disease: Secondary | ICD-10-CM | POA: Diagnosis not present

## 2017-01-03 DIAGNOSIS — N2581 Secondary hyperparathyroidism of renal origin: Secondary | ICD-10-CM | POA: Diagnosis not present

## 2017-01-03 DIAGNOSIS — D509 Iron deficiency anemia, unspecified: Secondary | ICD-10-CM | POA: Diagnosis not present

## 2017-01-03 DIAGNOSIS — N186 End stage renal disease: Secondary | ICD-10-CM | POA: Diagnosis not present

## 2017-01-04 ENCOUNTER — Ambulatory Visit (INDEPENDENT_AMBULATORY_CARE_PROVIDER_SITE_OTHER): Payer: Medicare Other

## 2017-01-04 DIAGNOSIS — I4891 Unspecified atrial fibrillation: Secondary | ICD-10-CM

## 2017-01-04 DIAGNOSIS — Z5181 Encounter for therapeutic drug level monitoring: Secondary | ICD-10-CM | POA: Diagnosis not present

## 2017-01-04 LAB — POCT INR: INR: 3.5

## 2017-01-05 DIAGNOSIS — N2581 Secondary hyperparathyroidism of renal origin: Secondary | ICD-10-CM | POA: Diagnosis not present

## 2017-01-05 DIAGNOSIS — I4891 Unspecified atrial fibrillation: Secondary | ICD-10-CM | POA: Diagnosis not present

## 2017-01-05 DIAGNOSIS — D509 Iron deficiency anemia, unspecified: Secondary | ICD-10-CM | POA: Diagnosis not present

## 2017-01-05 DIAGNOSIS — D631 Anemia in chronic kidney disease: Secondary | ICD-10-CM | POA: Diagnosis not present

## 2017-01-05 DIAGNOSIS — N186 End stage renal disease: Secondary | ICD-10-CM | POA: Diagnosis not present

## 2017-01-07 DIAGNOSIS — N2581 Secondary hyperparathyroidism of renal origin: Secondary | ICD-10-CM | POA: Diagnosis not present

## 2017-01-07 DIAGNOSIS — I4891 Unspecified atrial fibrillation: Secondary | ICD-10-CM | POA: Diagnosis not present

## 2017-01-07 DIAGNOSIS — D631 Anemia in chronic kidney disease: Secondary | ICD-10-CM | POA: Diagnosis not present

## 2017-01-07 DIAGNOSIS — N186 End stage renal disease: Secondary | ICD-10-CM | POA: Diagnosis not present

## 2017-01-07 DIAGNOSIS — D509 Iron deficiency anemia, unspecified: Secondary | ICD-10-CM | POA: Diagnosis not present

## 2017-01-10 ENCOUNTER — Other Ambulatory Visit: Payer: Self-pay | Admitting: Family

## 2017-01-10 DIAGNOSIS — D631 Anemia in chronic kidney disease: Secondary | ICD-10-CM | POA: Diagnosis not present

## 2017-01-10 DIAGNOSIS — D509 Iron deficiency anemia, unspecified: Secondary | ICD-10-CM | POA: Diagnosis not present

## 2017-01-10 DIAGNOSIS — N2581 Secondary hyperparathyroidism of renal origin: Secondary | ICD-10-CM | POA: Diagnosis not present

## 2017-01-10 DIAGNOSIS — I4891 Unspecified atrial fibrillation: Secondary | ICD-10-CM | POA: Diagnosis not present

## 2017-01-10 DIAGNOSIS — N186 End stage renal disease: Secondary | ICD-10-CM | POA: Diagnosis not present

## 2017-01-11 NOTE — Telephone Encounter (Signed)
Per 12/01/16 phone note, medication was discontinued at discharge on 10/02/15 and per PCP pt should not be taking medication due to being on dialysis. If pt is having a gout flare up, is there another alternative he can take? Left detailed message on his voicemail that we would let him know tomorrow if there is another alternative. Please advise?

## 2017-01-11 NOTE — Telephone Encounter (Signed)
Caller name: Zaylan Kissoon Relationship to patient: self Can be reached: [phone] Pharmacy: [facility], [address]  Reason for call: pt states he is out of meds and has been requesting for 3 weeks. Advised we received request yesterday. He stated again the pharmacy assured him it has been requested numerous times for 3 weeks. Please send in today per pt.

## 2017-01-12 DIAGNOSIS — N2581 Secondary hyperparathyroidism of renal origin: Secondary | ICD-10-CM | POA: Diagnosis not present

## 2017-01-12 DIAGNOSIS — D509 Iron deficiency anemia, unspecified: Secondary | ICD-10-CM | POA: Diagnosis not present

## 2017-01-12 DIAGNOSIS — D631 Anemia in chronic kidney disease: Secondary | ICD-10-CM | POA: Diagnosis not present

## 2017-01-12 DIAGNOSIS — N186 End stage renal disease: Secondary | ICD-10-CM | POA: Diagnosis not present

## 2017-01-12 DIAGNOSIS — I4891 Unspecified atrial fibrillation: Secondary | ICD-10-CM | POA: Diagnosis not present

## 2017-01-12 NOTE — Telephone Encounter (Signed)
Notified pt. He states he is not currently having a gout flare up ("it has subsided"). Advised pt to call us in the future for flare ups and we can proceed with Prednisone as recommended by PCP. Pt voices understanding.

## 2017-01-12 NOTE — Telephone Encounter (Signed)
I would recommend prednisone for acute gout flare if he is having one.  This medication is not recommended for patients with kidney failure.

## 2017-01-14 DIAGNOSIS — N2581 Secondary hyperparathyroidism of renal origin: Secondary | ICD-10-CM | POA: Diagnosis not present

## 2017-01-14 DIAGNOSIS — I4891 Unspecified atrial fibrillation: Secondary | ICD-10-CM | POA: Diagnosis not present

## 2017-01-14 DIAGNOSIS — N186 End stage renal disease: Secondary | ICD-10-CM | POA: Diagnosis not present

## 2017-01-14 DIAGNOSIS — Z992 Dependence on renal dialysis: Secondary | ICD-10-CM | POA: Diagnosis not present

## 2017-01-14 DIAGNOSIS — D509 Iron deficiency anemia, unspecified: Secondary | ICD-10-CM | POA: Diagnosis not present

## 2017-01-14 DIAGNOSIS — D631 Anemia in chronic kidney disease: Secondary | ICD-10-CM | POA: Diagnosis not present

## 2017-01-14 DIAGNOSIS — I158 Other secondary hypertension: Secondary | ICD-10-CM | POA: Diagnosis not present

## 2017-01-16 ENCOUNTER — Other Ambulatory Visit: Payer: Self-pay | Admitting: Family

## 2017-01-16 NOTE — Telephone Encounter (Signed)
Med was removed 08/2015 with reason of stop taking at discharge. Currently back on med list with directions of take 1 twice a day on non-dialysis days.  Please advise Furosemide request?

## 2017-01-17 DIAGNOSIS — D509 Iron deficiency anemia, unspecified: Secondary | ICD-10-CM | POA: Diagnosis not present

## 2017-01-17 DIAGNOSIS — N186 End stage renal disease: Secondary | ICD-10-CM | POA: Diagnosis not present

## 2017-01-17 DIAGNOSIS — D631 Anemia in chronic kidney disease: Secondary | ICD-10-CM | POA: Diagnosis not present

## 2017-01-17 DIAGNOSIS — I4891 Unspecified atrial fibrillation: Secondary | ICD-10-CM | POA: Diagnosis not present

## 2017-01-17 DIAGNOSIS — N2581 Secondary hyperparathyroidism of renal origin: Secondary | ICD-10-CM | POA: Diagnosis not present

## 2017-01-17 NOTE — Telephone Encounter (Signed)
Pt states his "kidney doctor" has told him to take 40mg  twice a day every day.  Please advise?

## 2017-01-17 NOTE — Telephone Encounter (Signed)
Could you please contact patient and verify what dose/schedule he is taking of furosemide.

## 2017-01-18 MED ORDER — FUROSEMIDE 40 MG PO TABS: 40.0000 mg | ORAL_TABLET | Freq: Two times a day (BID) | ORAL | 1 refills | Status: DC

## 2017-01-19 DIAGNOSIS — D631 Anemia in chronic kidney disease: Secondary | ICD-10-CM | POA: Diagnosis not present

## 2017-01-19 DIAGNOSIS — D509 Iron deficiency anemia, unspecified: Secondary | ICD-10-CM | POA: Diagnosis not present

## 2017-01-19 DIAGNOSIS — N2581 Secondary hyperparathyroidism of renal origin: Secondary | ICD-10-CM | POA: Diagnosis not present

## 2017-01-19 DIAGNOSIS — I4891 Unspecified atrial fibrillation: Secondary | ICD-10-CM | POA: Diagnosis not present

## 2017-01-19 DIAGNOSIS — N186 End stage renal disease: Secondary | ICD-10-CM | POA: Diagnosis not present

## 2017-01-21 DIAGNOSIS — N2581 Secondary hyperparathyroidism of renal origin: Secondary | ICD-10-CM | POA: Diagnosis not present

## 2017-01-21 DIAGNOSIS — I4891 Unspecified atrial fibrillation: Secondary | ICD-10-CM | POA: Diagnosis not present

## 2017-01-21 DIAGNOSIS — N186 End stage renal disease: Secondary | ICD-10-CM | POA: Diagnosis not present

## 2017-01-21 DIAGNOSIS — D509 Iron deficiency anemia, unspecified: Secondary | ICD-10-CM | POA: Diagnosis not present

## 2017-01-21 DIAGNOSIS — D631 Anemia in chronic kidney disease: Secondary | ICD-10-CM | POA: Diagnosis not present

## 2017-01-23 ENCOUNTER — Encounter: Payer: Self-pay | Admitting: Rheumatology

## 2017-01-23 ENCOUNTER — Ambulatory Visit (INDEPENDENT_AMBULATORY_CARE_PROVIDER_SITE_OTHER): Payer: Medicare Other | Admitting: Rheumatology

## 2017-01-23 VITALS — BP 130/74 | HR 72 | Resp 14 | Ht 73.5 in | Wt 260.0 lb

## 2017-01-23 DIAGNOSIS — M25551 Pain in right hip: Secondary | ICD-10-CM | POA: Diagnosis not present

## 2017-01-23 DIAGNOSIS — Z79899 Other long term (current) drug therapy: Secondary | ICD-10-CM | POA: Diagnosis not present

## 2017-01-23 DIAGNOSIS — Z7901 Long term (current) use of anticoagulants: Secondary | ICD-10-CM

## 2017-01-23 DIAGNOSIS — M1A39X0 Chronic gout due to renal impairment, multiple sites, without tophus (tophi): Secondary | ICD-10-CM | POA: Diagnosis not present

## 2017-01-23 DIAGNOSIS — M79641 Pain in right hand: Secondary | ICD-10-CM | POA: Diagnosis not present

## 2017-01-23 DIAGNOSIS — Z96651 Presence of right artificial knee joint: Secondary | ICD-10-CM

## 2017-01-23 DIAGNOSIS — Z992 Dependence on renal dialysis: Secondary | ICD-10-CM

## 2017-01-23 DIAGNOSIS — M0579 Rheumatoid arthritis with rheumatoid factor of multiple sites without organ or systems involvement: Secondary | ICD-10-CM

## 2017-01-23 DIAGNOSIS — N186 End stage renal disease: Secondary | ICD-10-CM

## 2017-01-23 MED ORDER — HYDROXYCHLOROQUINE SULFATE 200 MG PO TABS: ORAL_TABLET | ORAL | 1 refills | Status: DC

## 2017-01-23 NOTE — Progress Notes (Signed)
Office Visit Note  Patient: Bruce Cole             Date of Birth: 04-22-1941           MRN: [id number]             PCP: Debbrah Alar, NP Referring: Debbrah Alar, NP Visit Date: 01/23/2017 Occupation: '@GUAROCC' @    Subjective:  Medication Management   History of Present Illness: Bruce Cole is a 76 y.o. male  Was last seen in our office on 08/29/2016. He has a history of rheumatoid arthritis, is on high-risk prescription, has gout and kidney disease and has history of right nephrectomy.  On the last visit, patient reported that he was doing very well with his rheumatoid arthritis. He is taking Plaquenil on regular basis at 20 mg twice a day and getting good relief. He was scheduled to get his Plaquenil eye exam through [facility] on August 31 2016.   Note: Due to his chronic kidney disease, he had his right kidney removed. As a result of his kidney damage, he has elevated creatinine and decreased GFR as baseline. He is currently on dialysis.  Patient reports that his gout is doing well. He is not on any colchicine at this time per advice of his PCP due to his poor kidney function and having only one kidney.  Patient has had a right hip replacement that's been giving him some discomfort. He will speak with his orthopedist regarding this.   Activities of Daily Living:  Patient reports morning stiffness for 15 minutes.   Patient Reports nocturnal pain.  Difficulty dressing/grooming: Reports Difficulty climbing stairs: Reports Difficulty getting out of chair: Reports Difficulty using hands for taps, buttons, cutlery, and/or writing: Reports   Review of Systems  Constitutional: Negative for fatigue.  HENT: Negative for mouth sores and mouth dryness.   Eyes: Negative for dryness.  Respiratory: Negative for shortness of breath.   Gastrointestinal: Negative for constipation and diarrhea.  Musculoskeletal: Negative for myalgias and myalgias.  Skin:  Negative for sensitivity to sunlight.  Neurological: Negative for memory loss.  Psychiatric/Behavioral: Negative for sleep disturbance.    PMFS History:  Patient Active Problem List   Diagnosis Date Noted  . Hyperkalemia 08/01/2016  . Aftercare following surgery of the circulatory system 06/17/2016  . Status post total replacement of right hip 03/22/2016  . ESRD on dialysis ([place]) 11/27/2015  . Encounter for therapeutic drug monitoring 09/02/2013  . OSA (obstructive sleep apnea) 09/02/2013  . Chronic combined systolic and diastolic heart failure ([place]) 03/16/2013  . Allergic rhinitis 12/18/2012  . Long term current use of anticoagulant therapy 11/25/2010  . Genital herpes 05/31/2010  . DM (diabetes mellitus), type 2 with renal complications ([place]) 73/41/9379  . ERECTILE DYSFUNCTION, ORGANIC 09/21/2009  . Osteoarthritis 09/21/2009  . PERSONAL HX COLONIC POLYPS 08/25/2009  . Gout 07/22/2009  . Essential hypertension 07/22/2009  . ATRIAL FIBRILLATION 07/22/2009  . Rheumatoid arthritis ([place]) 07/22/2009  . NEPHROLITHIASIS, HX OF 07/22/2009    Past Medical History:  Diagnosis Date  . Acute blood loss anemia   . Atrial fibrillation (HCC)    Chronic  . Chronic combined systolic and diastolic heart failure ([place])   . Colon polyp 2000  . Dysrhythmia    hx  . ESRD on hemodialysis ([place])    [place]  . Essential hypertension   . Gastritis and gastroduodenitis   . GI bleed   . Gout   . Heart murmur   .  History of blood transfusion   . History of kidney stones   . Nephrolithiasis   . OSA (obstructive sleep apnea) 09/02/2013    IMPRESSION :  1. Mild obstructive sleep apnea with hypopneas causing sleep fragmentation and moderate oxygen desaturation.  2. Short runs of nonsustained VT were noted. His beta blocker may need to be titrated 3. Significant PLM's were noted, the PLM arousal index was low. Please correlate with clinical history of restless leg syndrome.  4. Sleep  efficiency was poor.  RECOMMENDATION:  1. Treatment options for this degree of sleep disordered breathing include weight loss and positional therapy to avoid supine sleep 2. Consider titrating beta blocker further, defer to cardiologist 3. Patient should be cautioned against driving when sleepy.They should be asked to avoid medications with sedative side effects     . Osteoarthritis of right hip   . Pneumonia    hx 30 yrs ago  . Primary osteoarthritis of right hip   . Rheumatoid arthritis ([place])   . Thrombocytopenia ([place])     Family History  Problem Relation Age of Onset  . Hypertension Mother   . Arthritis Mother        ?RA  . Hypertension Father    Past Surgical History:  Procedure Laterality Date  . AV FISTULA PLACEMENT Left 08/26/2015   Procedure: LEFT RADIOCEPHALIC FISTULA CREATION;  Surgeon: Rosetta Posner, MD;  Location: [place];  Service: Vascular;  Laterality: Left;  . AV FISTULA PLACEMENT Left 11/23/2015   Procedure: ARTERIOVENOUS (AV) FISTULA CREATION;  Surgeon: Rosetta Posner, MD;  Location: [place];  Service: Vascular;  Laterality: Left;  . BACK SURGERY     x2- discectomy  . CHOLECYSTECTOMY  1994  . CYSTOSCOPY/RETROGRADE/URETEROSCOPY/STONE EXTRACTION WITH BASKET    . ESOPHAGOGASTRODUODENOSCOPY N/A 11/13/2015   Procedure: ESOPHAGOGASTRODUODENOSCOPY (EGD);  Surgeon: Irene Shipper, MD;  Location: [facility] ENDOSCOPY;  Service: Endoscopy;  Laterality: N/A;  . INSERTION OF DIALYSIS CATHETER Left 11/23/2015   Procedure: INSERTION OF DIALYSIS CATHETER;  Surgeon: Rosetta Posner, MD;  Location: [place];  Service: Vascular;  Laterality: Left;  . IR GENERIC HISTORICAL Left 08/04/2016   IR DIALY SHUNT INTRO NEEDLE/INTRACATH INITIAL W/IMG LEFT 08/04/2016 Markus Daft, MD [facility]-INTER[facility] RAD  . IR GENERIC HISTORICAL  08/04/2016   IR US GUIDE VASC ACCESS LEFT 08/04/2016 Markus Daft, MD [facility]  . JOINT REPLACEMENT     Total L-Hip replacement, Right Knee 10/20/09  . LEFT AND RIGHT HEART CATHETERIZATION WITH CORONARY  ANGIOGRAM N/A 02/22/2013   Procedure: LEFT AND RIGHT HEART CATHETERIZATION WITH CORONARY ANGIOGRAM;  Surgeon: Jolaine Artist, MD;  Location: [facility] CATH LAB;  Service: Cardiovascular;  Laterality: N/A;  . LITHOTRIPSY  90's  . PERIPHERAL VASCULAR CATHETERIZATION Left 11/19/2015   Procedure: A/V/Fistulagram;  Surgeon: Conrad Bonny Doon, MD;  Location: [place] CV LAB;  Service: Cardiovascular;  Laterality: Left;  . PERIPHERAL VASCULAR CATHETERIZATION Left 07/21/2016   Procedure: A/V Fistulagram;  Surgeon: Conrad [place], MD;  Location: [place] CV LAB;  Service: Cardiovascular;  Laterality: Left;  arm  . REVISON OF ARTERIOVENOUS FISTULA Left 05/30/2016   Procedure: REVISION LEFT UPPER ARM FISTULA;  Surgeon: Conrad Ouray, MD;  Location: [place];  Service: Vascular;  Laterality: Left;  . REVISON OF ARTERIOVENOUS FISTULA Left 07/22/2016   Procedure: REVISON OF BASILIC VEIN TRANSPOSITION ANASTOMOSIS;  Surgeon: Rosetta Posner, MD;  Location: [place];  Service: Vascular;  Laterality: Left;  . SPINE SURGERY     x 2  .  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 11/25/15   Widowed   Never smoked   FULL CODE     Objective: Vital Signs: BP 130/74   Pulse 72   Resp 14   Ht 6' 1.5" (1.867 m)   Wt 260 lb (117.9 kg)   BMI 33.84 kg/m    Physical Exam  Constitutional: He is oriented to person, place, and time. He appears well-developed and well-nourished.  HENT:  Head: Normocephalic and atraumatic.  Eyes: Conjunctivae and EOM are normal. Pupils are equal, round, and reactive to light.  Neck: Normal range of motion. Neck supple.  Cardiovascular: Normal rate, regular rhythm and normal heart sounds.  Exam reveals no gallop and no friction rub.     No murmur heard. Pulmonary/Chest: Effort normal and breath sounds normal. No respiratory distress. He has no wheezes. He has no rales. He exhibits no tenderness.  Abdominal: Soft. He exhibits no distension and no mass. There is no tenderness. There is no guarding.  Musculoskeletal: Normal range of motion.  Lymphadenopathy:    He has no cervical adenopathy.  Neurological: He is alert and oriented to person, place, and time. He exhibits normal muscle tone. Coordination normal.  Skin: Skin is warm and dry. Capillary refill takes less than 2 seconds. No rash noted.  Psychiatric: He has a normal mood and affect. His behavior is normal. Judgment and thought content normal.  Vitals reviewed.    Musculoskeletal Exam:  Full range of motion of all joints except has only 90 of abduction of bilateral shoulders. Grip strength is equal and strong bilaterally Fibromyalgia tender points are absent  CDAI Exam: CDAI Homunculus Exam:   Joint Counts:  CDAI Tender Joint count: 0 CDAI Swollen Joint count: 0   No synovitis on examination. Right CMC is tender to palpation.  Investigation: No additional findings. Anti-coag visit on 01/04/2017  Component Date Value Ref Range Status  . INR 01/04/2017 3.5   Final  Anti-coag visit on 12/14/2016  Component Date Value Ref Range Status  . INR 12/14/2016 1.8   Final  Anti-coag visit on 11/23/2016  Component Date Value Ref Range Status  . INR 11/23/2016 2.4   Final  Office Visit on 11/14/2016  Component Date Value Ref Range Status  . Hgb A1c MFr Bld 11/14/2016 6.3  4.6 - 6.5 % Final   Glycemic Control Guidelines for People with Diabetes:Non Diabetic:  <6%Goal of Therapy: <7%Additional Action Suggested:  >8%   . Cholesterol 11/14/2016 178  0 - 200 mg/dL Final   ATP III Classification       Desirable:  < 200 mg/dL               Borderline High:  200 - 239 mg/dL          High:  > = 240 mg/dL  . Triglycerides 11/14/2016 95.0  0.0 - 149.0 mg/dL Final    Normal:  <150 mg/dLBorderline High:  150 - 199 mg/dL  . HDL 11/14/2016 73.20  >39.00 mg/dL Final  . VLDL 11/14/2016 19.0  0.0 - 40.0 mg/dL Final  . LDL Cholesterol 11/14/2016 86  0 - 99 mg/dL Final  . Total CHOL/HDL Ratio 11/14/2016 2   Final  Men          Women1/2 Average Risk     3.4          3.3Average Risk          5.0          4.42X Average Risk          9.6          7.13X Average Risk          15.0          11.0                      . NonHDL 11/14/2016 104.87   Final   NOTE:  Non-HDL goal should be 30 mg/dL higher than patient's LDL goal (i.e. LDL goal of < 70 mg/dL, would have non-HDL goal of < 100 mg/dL)  Scanned Document on 11/10/2016  Component Date Value Ref Range Status  . INR 11/10/2016 1.9* 0.9 - 1.1 Corrected  Admission on 10/27/2016, Discharged on 10/27/2016  Component Date Value Ref Range Status  . Sodium 10/27/2016 138  135 - 145 mmol/L Final  . Potassium 10/27/2016 4.2  3.5 - 5.1 mmol/L Final  . Chloride 10/27/2016 97* 101 - 111 mmol/L Final  . CO2 10/27/2016 28  22 - 32 mmol/L Final  . Glucose, Bld 10/27/2016 88  65 - 99 mg/dL Final  . BUN 10/27/2016 28* 6 - 20 mg/dL Final  . Creatinine, Ser 10/27/2016 5.56* 0.61 - 1.24 mg/dL Final  . Calcium 10/27/2016 9.5  8.9 - 10.3 mg/dL Final  . Total Protein 10/27/2016 8.5* 6.5 - 8.1 g/dL Final  . Albumin 10/27/2016 4.1  3.5 - 5.0 g/dL Final  . AST 10/27/2016 27  15 - 41 U/L Final  . ALT 10/27/2016 15* 17 - 63 U/L Final  . Alkaline Phosphatase 10/27/2016 119  38 - 126 U/L Final  . Total Bilirubin 10/27/2016 0.4  0.3 - 1.2 mg/dL Final  . GFR calc non Af Amer 10/27/2016 9* >60 mL/min Final  . GFR calc Af Amer 10/27/2016 11* >60 mL/min Final   Comment: (NOTE) The eGFR has been calculated using the CKD EPI equation. This calculation has not been validated in all clinical situations. eGFR's persistently <60 mL/min signify possible Chronic Kidney Disease.   . Anion gap 10/27/2016 13  5 - 15 Final  . WBC  10/27/2016 8.5  4.0 - 10.5 K/uL Final  . RBC 10/27/2016 3.62* 4.22 - 5.81 MIL/uL Final  . Hemoglobin 10/27/2016 11.5* 13.0 - 17.0 g/dL Final  . HCT 10/27/2016 34.6* 39.0 - 52.0 % Final  . MCV 10/27/2016 95.6  78.0 - 100.0 fL Final  . MCH 10/27/2016 31.8  26.0 - 34.0 pg Final  . MCHC 10/27/2016 33.2  30.0 - 36.0 g/dL Final  . RDW 10/27/2016 16.0* 11.5 - 15.5 % Final  . Platelets 10/27/2016 187  150 - 400 K/uL Final  . ABO/RH(D) 10/27/2016 O POS   Final  . Antibody Screen 10/27/2016 NEG   Final  . Sample Expiration 10/27/2016 10/30/2016   Final  . Sodium 10/27/2016 137  135 - 145 mmol/L Final  . Potassium 10/27/2016 4.3  3.5 - 5.1 mmol/L Final  . Chloride 10/27/2016 100* 101 - 111 mmol/L Final  . BUN 10/27/2016 31* 6 - 20 mg/dL Final  . Creatinine, Ser 10/27/2016 5.80* 0.61 - 1.24 mg/dL Final  . Glucose, Bld 10/27/2016 91  65 - 99 mg/dL Final  . Calcium, Ion  10/27/2016 1.02* 1.15 - 1.40 mmol/L Final  . TCO2 10/27/2016 31  0 - 100 mmol/L Final  . Hemoglobin 10/27/2016 11.6* 13.0 - 17.0 g/dL Final  . HCT 10/27/2016 34.0* 39.0 - 52.0 % Final  . Prothrombin Time 10/27/2016 27.0* 11.4 - 15.2 seconds Final  . INR 10/27/2016 2.44   Final  Anti-coag visit on 10/19/2016  Component Date Value Ref Range Status  . INR 10/19/2016 1.3   Final  Anti-coag visit on 09/28/2016  Component Date Value Ref Range Status  . INR 09/28/2016 2.2   Final  Anti-coag visit on 09/14/2016  Component Date Value Ref Range Status  . INR 09/14/2016 2.2   Final  Anti-coag visit on 09/07/2016  Component Date Value Ref Range Status  . INR 09/07/2016 1.4   Final  There may be more visits with results that are not included.     Imaging: No results found.         Speciality Comments: No specialty comments available.    Procedures:  No procedures performed Allergies: Ace inhibitors   Assessment / Plan:     Visit Diagnoses: Rheumatoid arthritis involving multiple sites with positive rheumatoid factor  (HCC)  High risk medications (not anticoagulants) long-term use  Long term current use of anticoagulant therapy  ESRD on dialysis ([place])  Chronic gout due to renal impairment of multiple sites without tophus  Pain in right hip  Pain in right hand  Total knee replacement status, right   Plan: #1: Osteoarthritis of the right first [place]. Patient will ask his doctor if he can use Voltaren gel.   #2: Left elbow with 10 of flexion/contraction Right elbow with 20 of flexion/contraction. Patient reports that the elbows have been contracted like that since his kidney problem (about a year)  #3: History of right total knee replacement. Doing well.  #4: Rheumatoid arthritis. No joint pain stiffness and swelling except for the right first [facility] joint consistent with osteoarthritis. Patient is taking Plaquenil 200 mg twice a day with good results. Had Plaquenil eye exam approximately February 2018 and the eye exam was normal. He goes to see his eye doctor every 6 months because also looking for his glaucoma. Patient reports that his Plaquenil eye exam was negative  #5: Abnormal kidney function. Elevated creatinine and decreased GFR. Patient gets dialysis. Due to his normal rheumatoid arthritis exam today and abnormal kidney function, there is consideration for decreasing the Plaquenil to 200 mg once a day instead of the current dose of 200 mg twice a day. Dr. Estanislado Pandy will speak with the nephrologist to get their input on whether he can take the full dose. Currently he will go to 200 mg once a day and I'll write the prescription accordingly. I personally spoke with Dr. [name] and he recommended 200 mg once a day would be the best dose for him at this time. He also stated that the patient should take the Plaquenil after the dialysis done so the medication is not dialyzed out of the system. I will call the patient and reminded him of this. Message sent to Seth Bake so she can contact the  patient or discuss the above with him if he were to call us back.  #5: Return to clinic in 5 months for follow-up  #6: History of gout. Due to his poor kidney function and dialysis, he is not on any gout medication. His PCP took him off of colchicine. He has an appointment with Dr. [name], his nephrologist tomorrow. He  will discuss with him if he can be on allopurinol or Uloric as a prophylactic to getting a gout flare.  Orders: No orders of the defined types were placed in this encounter.  No orders of the defined types were placed in this encounter.   Face-to-face time spent with patient was 30 minutes. 50% of time was spent in counseling and coordination of care.  Follow-Up Instructions: Return in about 5 months (around 06/25/2017) for RA,plq 200qd; plq eye nl, rt cmc pain, rt hip pain, gout .   Eliezer Lofts, PA-C   I examined and evaluated the patient with Eliezer Lofts PA. The plan of care was discussed as noted above.  Bo Merino, MD   Note - This record has been created using Editor, commissioning.  Chart creation errors have been sought, but may not always  have been located. Such creation errors do not reflect on  the standard of medical care.

## 2017-01-24 ENCOUNTER — Telehealth: Payer: Self-pay | Admitting: Rheumatology

## 2017-01-24 DIAGNOSIS — N186 End stage renal disease: Secondary | ICD-10-CM | POA: Diagnosis not present

## 2017-01-24 DIAGNOSIS — D631 Anemia in chronic kidney disease: Secondary | ICD-10-CM | POA: Diagnosis not present

## 2017-01-24 DIAGNOSIS — I4891 Unspecified atrial fibrillation: Secondary | ICD-10-CM | POA: Diagnosis not present

## 2017-01-24 DIAGNOSIS — N2581 Secondary hyperparathyroidism of renal origin: Secondary | ICD-10-CM | POA: Diagnosis not present

## 2017-01-24 DIAGNOSIS — D509 Iron deficiency anemia, unspecified: Secondary | ICD-10-CM | POA: Diagnosis not present

## 2017-01-24 NOTE — Telephone Encounter (Signed)
I called patient's cell phone and relayed the information that I had collected from Dr. [name], nephrologist.Specifically, patient should take Plaquenil 200 mg once a day and on the days that he has his dialysis, take the medication after his dialysis is finished. Patient understands and is agreeable

## 2017-01-24 NOTE — Telephone Encounter (Signed)
Patient returning [name] call regarding medicine. Patient is in Dialysis until 10 am this morning, if you want to call him now.

## 2017-01-25 ENCOUNTER — Ambulatory Visit (INDEPENDENT_AMBULATORY_CARE_PROVIDER_SITE_OTHER): Payer: Medicare Other | Admitting: Pharmacist

## 2017-01-25 DIAGNOSIS — I4891 Unspecified atrial fibrillation: Secondary | ICD-10-CM

## 2017-01-25 DIAGNOSIS — Z5181 Encounter for therapeutic drug level monitoring: Secondary | ICD-10-CM

## 2017-01-25 LAB — POCT INR: INR: 5.3

## 2017-01-26 DIAGNOSIS — I4891 Unspecified atrial fibrillation: Secondary | ICD-10-CM | POA: Diagnosis not present

## 2017-01-26 DIAGNOSIS — D509 Iron deficiency anemia, unspecified: Secondary | ICD-10-CM | POA: Diagnosis not present

## 2017-01-26 DIAGNOSIS — N186 End stage renal disease: Secondary | ICD-10-CM | POA: Diagnosis not present

## 2017-01-26 DIAGNOSIS — D631 Anemia in chronic kidney disease: Secondary | ICD-10-CM | POA: Diagnosis not present

## 2017-01-26 DIAGNOSIS — N2581 Secondary hyperparathyroidism of renal origin: Secondary | ICD-10-CM | POA: Diagnosis not present

## 2017-01-28 DIAGNOSIS — D509 Iron deficiency anemia, unspecified: Secondary | ICD-10-CM | POA: Diagnosis not present

## 2017-01-28 DIAGNOSIS — N186 End stage renal disease: Secondary | ICD-10-CM | POA: Diagnosis not present

## 2017-01-28 DIAGNOSIS — N2581 Secondary hyperparathyroidism of renal origin: Secondary | ICD-10-CM | POA: Diagnosis not present

## 2017-01-28 DIAGNOSIS — D631 Anemia in chronic kidney disease: Secondary | ICD-10-CM | POA: Diagnosis not present

## 2017-01-28 DIAGNOSIS — I4891 Unspecified atrial fibrillation: Secondary | ICD-10-CM | POA: Diagnosis not present

## 2017-01-30 ENCOUNTER — Ambulatory Visit: Payer: Medicare Other | Admitting: Rheumatology

## 2017-01-30 DIAGNOSIS — L84 Corns and callosities: Secondary | ICD-10-CM | POA: Diagnosis not present

## 2017-01-30 DIAGNOSIS — B351 Tinea unguium: Secondary | ICD-10-CM | POA: Diagnosis not present

## 2017-01-31 DIAGNOSIS — D631 Anemia in chronic kidney disease: Secondary | ICD-10-CM | POA: Diagnosis not present

## 2017-01-31 DIAGNOSIS — N2581 Secondary hyperparathyroidism of renal origin: Secondary | ICD-10-CM | POA: Diagnosis not present

## 2017-01-31 DIAGNOSIS — I4891 Unspecified atrial fibrillation: Secondary | ICD-10-CM | POA: Diagnosis not present

## 2017-01-31 DIAGNOSIS — D509 Iron deficiency anemia, unspecified: Secondary | ICD-10-CM | POA: Diagnosis not present

## 2017-01-31 DIAGNOSIS — N186 End stage renal disease: Secondary | ICD-10-CM | POA: Diagnosis not present

## 2017-02-01 DIAGNOSIS — E8779 Other fluid overload: Secondary | ICD-10-CM | POA: Diagnosis not present

## 2017-02-01 DIAGNOSIS — N2581 Secondary hyperparathyroidism of renal origin: Secondary | ICD-10-CM | POA: Diagnosis not present

## 2017-02-01 DIAGNOSIS — N186 End stage renal disease: Secondary | ICD-10-CM | POA: Diagnosis not present

## 2017-02-02 DIAGNOSIS — N2581 Secondary hyperparathyroidism of renal origin: Secondary | ICD-10-CM | POA: Diagnosis not present

## 2017-02-02 DIAGNOSIS — I4891 Unspecified atrial fibrillation: Secondary | ICD-10-CM | POA: Diagnosis not present

## 2017-02-02 DIAGNOSIS — N186 End stage renal disease: Secondary | ICD-10-CM | POA: Diagnosis not present

## 2017-02-02 DIAGNOSIS — D631 Anemia in chronic kidney disease: Secondary | ICD-10-CM | POA: Diagnosis not present

## 2017-02-02 DIAGNOSIS — D509 Iron deficiency anemia, unspecified: Secondary | ICD-10-CM | POA: Diagnosis not present

## 2017-02-03 ENCOUNTER — Ambulatory Visit (INDEPENDENT_AMBULATORY_CARE_PROVIDER_SITE_OTHER): Payer: Medicare Other | Admitting: *Deleted

## 2017-02-03 DIAGNOSIS — I4891 Unspecified atrial fibrillation: Secondary | ICD-10-CM

## 2017-02-03 DIAGNOSIS — Z5181 Encounter for therapeutic drug level monitoring: Secondary | ICD-10-CM | POA: Diagnosis not present

## 2017-02-03 LAB — POCT INR: INR: 1.7

## 2017-02-04 DIAGNOSIS — I4891 Unspecified atrial fibrillation: Secondary | ICD-10-CM | POA: Diagnosis not present

## 2017-02-04 DIAGNOSIS — D631 Anemia in chronic kidney disease: Secondary | ICD-10-CM | POA: Diagnosis not present

## 2017-02-04 DIAGNOSIS — N186 End stage renal disease: Secondary | ICD-10-CM | POA: Diagnosis not present

## 2017-02-04 DIAGNOSIS — N2581 Secondary hyperparathyroidism of renal origin: Secondary | ICD-10-CM | POA: Diagnosis not present

## 2017-02-04 DIAGNOSIS — D509 Iron deficiency anemia, unspecified: Secondary | ICD-10-CM | POA: Diagnosis not present

## 2017-02-07 DIAGNOSIS — I4891 Unspecified atrial fibrillation: Secondary | ICD-10-CM | POA: Diagnosis not present

## 2017-02-07 DIAGNOSIS — N186 End stage renal disease: Secondary | ICD-10-CM | POA: Diagnosis not present

## 2017-02-07 DIAGNOSIS — N2581 Secondary hyperparathyroidism of renal origin: Secondary | ICD-10-CM | POA: Diagnosis not present

## 2017-02-07 DIAGNOSIS — D631 Anemia in chronic kidney disease: Secondary | ICD-10-CM | POA: Diagnosis not present

## 2017-02-07 DIAGNOSIS — D509 Iron deficiency anemia, unspecified: Secondary | ICD-10-CM | POA: Diagnosis not present

## 2017-02-09 ENCOUNTER — Encounter: Payer: Self-pay | Admitting: Family

## 2017-02-09 DIAGNOSIS — D631 Anemia in chronic kidney disease: Secondary | ICD-10-CM | POA: Diagnosis not present

## 2017-02-09 DIAGNOSIS — N186 End stage renal disease: Secondary | ICD-10-CM | POA: Diagnosis not present

## 2017-02-09 DIAGNOSIS — D509 Iron deficiency anemia, unspecified: Secondary | ICD-10-CM | POA: Diagnosis not present

## 2017-02-09 DIAGNOSIS — I4891 Unspecified atrial fibrillation: Secondary | ICD-10-CM | POA: Diagnosis not present

## 2017-02-09 DIAGNOSIS — N2581 Secondary hyperparathyroidism of renal origin: Secondary | ICD-10-CM | POA: Diagnosis not present

## 2017-02-11 ENCOUNTER — Other Ambulatory Visit: Payer: Self-pay | Admitting: Internal Medicine

## 2017-02-11 DIAGNOSIS — N2581 Secondary hyperparathyroidism of renal origin: Secondary | ICD-10-CM | POA: Diagnosis not present

## 2017-02-11 DIAGNOSIS — N186 End stage renal disease: Secondary | ICD-10-CM | POA: Diagnosis not present

## 2017-02-11 DIAGNOSIS — I4891 Unspecified atrial fibrillation: Secondary | ICD-10-CM | POA: Diagnosis not present

## 2017-02-11 DIAGNOSIS — D631 Anemia in chronic kidney disease: Secondary | ICD-10-CM | POA: Diagnosis not present

## 2017-02-11 DIAGNOSIS — D509 Iron deficiency anemia, unspecified: Secondary | ICD-10-CM | POA: Diagnosis not present

## 2017-02-13 ENCOUNTER — Telehealth: Payer: Self-pay | Admitting: *Deleted

## 2017-02-13 NOTE — Telephone Encounter (Signed)
Received Abnormal lab results with critical values from Dr. Mercy Moore, Nephrologist with [facility]. Result Reviewed By Dr. Cherylann Banas PCP absence] on  02/13/17; stable, copy Hold for PCP/SLS 07/30

## 2017-02-14 DIAGNOSIS — D631 Anemia in chronic kidney disease: Secondary | ICD-10-CM | POA: Diagnosis not present

## 2017-02-14 DIAGNOSIS — Z992 Dependence on renal dialysis: Secondary | ICD-10-CM | POA: Diagnosis not present

## 2017-02-14 DIAGNOSIS — N186 End stage renal disease: Secondary | ICD-10-CM | POA: Diagnosis not present

## 2017-02-14 DIAGNOSIS — I4891 Unspecified atrial fibrillation: Secondary | ICD-10-CM | POA: Diagnosis not present

## 2017-02-14 DIAGNOSIS — I158 Other secondary hypertension: Secondary | ICD-10-CM | POA: Diagnosis not present

## 2017-02-14 DIAGNOSIS — D509 Iron deficiency anemia, unspecified: Secondary | ICD-10-CM | POA: Diagnosis not present

## 2017-02-14 DIAGNOSIS — N2581 Secondary hyperparathyroidism of renal origin: Secondary | ICD-10-CM | POA: Diagnosis not present

## 2017-02-16 DIAGNOSIS — N186 End stage renal disease: Secondary | ICD-10-CM | POA: Diagnosis not present

## 2017-02-16 DIAGNOSIS — I4891 Unspecified atrial fibrillation: Secondary | ICD-10-CM | POA: Diagnosis not present

## 2017-02-16 DIAGNOSIS — N2581 Secondary hyperparathyroidism of renal origin: Secondary | ICD-10-CM | POA: Diagnosis not present

## 2017-02-16 DIAGNOSIS — D631 Anemia in chronic kidney disease: Secondary | ICD-10-CM | POA: Diagnosis not present

## 2017-02-16 DIAGNOSIS — E8770 Fluid overload, unspecified: Secondary | ICD-10-CM | POA: Diagnosis not present

## 2017-02-17 ENCOUNTER — Ambulatory Visit (INDEPENDENT_AMBULATORY_CARE_PROVIDER_SITE_OTHER): Payer: Medicare Other

## 2017-02-17 DIAGNOSIS — Z5181 Encounter for therapeutic drug level monitoring: Secondary | ICD-10-CM

## 2017-02-17 DIAGNOSIS — I4891 Unspecified atrial fibrillation: Secondary | ICD-10-CM | POA: Diagnosis not present

## 2017-02-17 LAB — POCT INR: INR: 3.6

## 2017-02-18 DIAGNOSIS — E8770 Fluid overload, unspecified: Secondary | ICD-10-CM | POA: Diagnosis not present

## 2017-02-18 DIAGNOSIS — D631 Anemia in chronic kidney disease: Secondary | ICD-10-CM | POA: Diagnosis not present

## 2017-02-18 DIAGNOSIS — N2581 Secondary hyperparathyroidism of renal origin: Secondary | ICD-10-CM | POA: Diagnosis not present

## 2017-02-18 DIAGNOSIS — I4891 Unspecified atrial fibrillation: Secondary | ICD-10-CM | POA: Diagnosis not present

## 2017-02-18 DIAGNOSIS — N186 End stage renal disease: Secondary | ICD-10-CM | POA: Diagnosis not present

## 2017-02-20 ENCOUNTER — Other Ambulatory Visit: Payer: Self-pay | Admitting: Family

## 2017-02-20 ENCOUNTER — Encounter: Payer: Self-pay | Admitting: Family

## 2017-02-20 ENCOUNTER — Ambulatory Visit (INDEPENDENT_AMBULATORY_CARE_PROVIDER_SITE_OTHER): Payer: Medicare Other | Admitting: Family

## 2017-02-20 VITALS — BP 121/49 | HR 48 | Temp 98.1°F | Resp 18 | Ht 74.0 in | Wt 260.6 lb

## 2017-02-20 DIAGNOSIS — N186 End stage renal disease: Secondary | ICD-10-CM | POA: Diagnosis not present

## 2017-02-20 DIAGNOSIS — I10 Essential (primary) hypertension: Secondary | ICD-10-CM | POA: Diagnosis not present

## 2017-02-20 DIAGNOSIS — E118 Type 2 diabetes mellitus with unspecified complications: Secondary | ICD-10-CM | POA: Diagnosis not present

## 2017-02-20 DIAGNOSIS — E785 Hyperlipidemia, unspecified: Secondary | ICD-10-CM | POA: Diagnosis not present

## 2017-02-20 DIAGNOSIS — M109 Gout, unspecified: Secondary | ICD-10-CM

## 2017-02-20 LAB — URIC ACID: URIC ACID, SERUM: 5.7 mg/dL (ref 4.0–7.8)

## 2017-02-20 MED ORDER — COLCHICINE 0.6 MG PO TABS: ORAL_TABLET | ORAL | 0 refills | Status: DC

## 2017-02-20 MED ORDER — ATORVASTATIN CALCIUM 10 MG PO TABS: 10.0000 mg | ORAL_TABLET | Freq: Every day | ORAL | 1 refills | Status: DC

## 2017-02-20 MED ORDER — GABAPENTIN 300 MG PO CAPS: ORAL_CAPSULE | ORAL | 1 refills | Status: DC

## 2017-02-20 NOTE — Patient Instructions (Signed)
Please complete lab work prior to leaving.   

## 2017-02-20 NOTE — Progress Notes (Signed)
Subjective:    Patient ID: Bruce Cole, male    DOB: [birth year], 76 y.o.   MRN: [id number]  HPI  Bruce Cole is a 76 yr old male with ESRD on HD who presents today for follow up.  1) DM2- maintained on diet alone. Not checking sugars.  Lab Results  Component Value Date   HGBA1C 6.3 11/14/2016   HGBA1C 6.1 (H) 08/02/2016   HGBA1C 5.5 06/24/2016   Lab Results  Component Value Date   MICROALBUR 20.7 (H) 12/16/2014   LDLCALC 86 11/14/2016   CREATININE 5.80 (H) 10/27/2016   2) Hypertension- Not currently on antihypertensives.  BP Readings from Last 3 Encounters:  01/23/17 130/74  11/14/16 (!) 100/58  10/27/16 95/70   3) Gout-  No recent gout flares. Allopurinol stopped due to hemodialysis.    4) hyperlipidemia- reports that he never started atorvastatin.   Lab Results  Component Value Date   CHOL 178 11/14/2016   HDL 73.20 11/14/2016   LDLCALC 86 11/14/2016   TRIG 95.0 11/14/2016   CHOLHDL 2 11/14/2016    Review of Systems    see HPI  Past Medical History:  Diagnosis Date  . Acute blood loss anemia   . Atrial fibrillation (HCC)    Chronic  . Chronic combined systolic and diastolic heart failure ([place])   . Colon polyp 2000  . Dysrhythmia    hx  . ESRD on hemodialysis ([place])    [place]  . Essential hypertension   . Gastritis and gastroduodenitis   . GI bleed   . Gout   . Heart murmur   . History of blood transfusion   . History of kidney stones   . Nephrolithiasis   . OSA (obstructive sleep apnea) 09/02/2013    IMPRESSION :  1. Mild obstructive sleep apnea with hypopneas causing sleep fragmentation and moderate oxygen desaturation.  2. Short runs of nonsustained VT were noted. His beta blocker may need to be titrated 3. Significant PLM's were noted, the PLM arousal index was low. Please correlate with clinical history of restless leg syndrome.  4. Sleep efficiency was poor.  RECOMMENDATION:  1. Treatment options for this degree of sleep  disordered breathing include weight loss and positional therapy to avoid supine sleep 2. Consider titrating beta blocker further, defer to cardiologist 3. Patient should be cautioned against driving when sleepy.They should be asked to avoid medications with sedative side effects     . Osteoarthritis of right hip   . Pneumonia    hx 30 yrs ago  . Primary osteoarthritis of right hip   . Rheumatoid arthritis ([place])   . Thrombocytopenia [facility])      Social History   Social History  . Marital status: Single    Spouse name: N/A  . Number of children: 3  . Years of education: N/A   Occupational History  . retired Programmer, multimedia  Retired   Social History Main Topics  . Smoking status: Never Smoker  . Smokeless tobacco: Never Used  . Alcohol use No     Comment: occasional  . Drug use: No  . Sexual activity: Not on file   Other Topics Concern  . Not on file   Social History Narrative   Former [place] [employer] and [place]   Admitted to [facility] 11/25/15   Widowed   Never smoked   FULL CODE    Past Surgical History:  Procedure Laterality Date  . AV FISTULA PLACEMENT  Left 08/26/2015   Procedure: LEFT RADIOCEPHALIC FISTULA CREATION;  Surgeon: Rosetta Posner, MD;  Location: [place];  Service: Vascular;  Laterality: Left;  . AV FISTULA PLACEMENT Left 11/23/2015   Procedure: ARTERIOVENOUS (AV) FISTULA CREATION;  Surgeon: Rosetta Posner, MD;  Location: [place];  Service: Vascular;  Laterality: Left;  . BACK SURGERY     x2- discectomy  . CHOLECYSTECTOMY  1994  . CYSTOSCOPY/RETROGRADE/URETEROSCOPY/STONE EXTRACTION WITH BASKET    . ESOPHAGOGASTRODUODENOSCOPY N/A 11/13/2015   Procedure: ESOPHAGOGASTRODUODENOSCOPY (EGD);  Surgeon: Irene Shipper, MD;  Location: [facility] ENDOSCOPY;  Service: Endoscopy;  Laterality: N/A;  . INSERTION OF DIALYSIS CATHETER Left 11/23/2015   Procedure: INSERTION OF DIALYSIS CATHETER;  Surgeon: Rosetta Posner, MD;  Location: [place];  Service: Vascular;  Laterality: Left;  .  IR GENERIC HISTORICAL Left 08/04/2016   IR DIALY SHUNT INTRO NEEDLE/INTRACATH INITIAL W/IMG LEFT 08/04/2016 Markus Daft, MD [facility]INTER[facility]  . IR GENERIC HISTORICAL  08/04/2016   IR US GUIDE VASC ACCESS LEFT 08/04/2016 Markus Daft, MD [facility]  . JOINT REPLACEMENT     Total L-Hip replacement, Right Knee 10/20/09  . LEFT AND RIGHT HEART CATHETERIZATION WITH CORONARY ANGIOGRAM N/A 02/22/2013   Procedure: LEFT AND RIGHT HEART CATHETERIZATION WITH CORONARY ANGIOGRAM;  Surgeon: Jolaine Artist, MD;  Location: [facility] CATH LAB;  Service: Cardiovascular;  Laterality: N/A;  . LITHOTRIPSY  90's  . PERIPHERAL VASCULAR CATHETERIZATION Left 11/19/2015   Procedure: A/V/Fistulagram;  Surgeon: Conrad [place], MD;  Location: [place] CV LAB;  Service: Cardiovascular;  Laterality: Left;  . PERIPHERAL VASCULAR CATHETERIZATION Left 07/21/2016   Procedure: A/V Fistulagram;  Surgeon: Conrad [place], MD;  Location: [place] CV LAB;  Service: Cardiovascular;  Laterality: Left;  arm  . REVISON OF ARTERIOVENOUS FISTULA Left 05/30/2016   Procedure: REVISION LEFT UPPER ARM FISTULA;  Surgeon: Conrad , MD;  Location: [place];  Service: Vascular;  Laterality: Left;  . REVISON OF ARTERIOVENOUS FISTULA Left 07/22/2016   Procedure: REVISON OF BASILIC VEIN TRANSPOSITION ANASTOMOSIS;  Surgeon: Rosetta Posner, MD;  Location: [place];  Service: Vascular;  Laterality: Left;  . SPINE SURGERY     x 2  . TOTAL HIP ARTHROPLASTY Right 03/22/2016   Procedure: RIGHT TOTAL HIP ARTHROPLASTY ANTERIOR APPROACH;  Surgeon: Mcarthur Rossetti, MD;  Location: [place];  Service: Orthopedics;  Laterality: Right;    Family History  Problem Relation Age of Onset  . Hypertension Mother   . Arthritis Mother        ?RA  . Hypertension Father     Allergies  Allergen Reactions  . Ace Inhibitors Other (See Comments)    Worsening renal insufficiency    Current Outpatient Prescriptions on File Prior to Visit  Medication Sig Dispense Refill  . acetaminophen  (TYLENOL) 500 MG tablet Take 500 mg by mouth every 6 (six) hours as needed for mild pain.    [name] albuterol (PROVENTIL) (2.5 MG/3ML) 0.083% nebulizer solution Take 2.5 mg by nebulization every 4 (four) hours as needed for shortness of breath. Reported on 09/25/2015    . B Complex-C-Folic Acid (RENA-VITE RX) 1 MG TABS Take 1 tablet by mouth at bedtime.   6  . fluticasone (FLONASE) 50 MCG/ACT nasal spray Place 2 sprays into both nostrils daily. (Patient taking differently: Place 2 sprays into both nostrils daily as needed for allergies. ) 16 g 6  . furosemide (LASIX) 40 MG tablet Take 1 tablet (40 mg total) by mouth 2 (two) times  daily. 180 tablet 1  . gabapentin (NEURONTIN) 100 MG capsule Take 100 mg by mouth 2 (two) times daily.    [name] gabapentin (NEURONTIN) 300 MG capsule TAKE 1 CAPSULE(300 MG) BY MOUTH AT BEDTIME 30 capsule 0  . hydroxychloroquine (PLAQUENIL) 200 MG tablet TAKE 1 TABLET(200 MG) BY MOUTH DAILY 90 tablet 1  . RENVELA 800 MG tablet Take 3 tablets (2,400 mg total) by mouth 3 (three) times daily with meals. 90 tablet 0  . warfarin (COUMADIN) 7.5 MG tablet TAKE 1 TABLET BY MOUTH DAILY AS DIRECTED BY COUMADIN CLINIC 100 tablet 1   No current facility-administered medications on file prior to visit.     BP (!) 121/49 (BP Location: Right Arm, Cuff Size: Large)   Pulse (!) 48   Temp 98.1 F (36.7 C) (Oral)   Resp 18   Ht 6\' 2"  (1.88 m)   Wt 260 lb 9.6 oz (118.2 kg)   SpO2 100%   BMI 33.46 kg/m    Objective:   Physical Exam  Constitutional: He is oriented to person, place, and time. He appears well-developed and well-nourished. No distress.  HENT:  Head: Normocephalic and atraumatic.  Cardiovascular: Normal rate and regular rhythm.   No murmur heard. Pulses:      Dorsalis pedis pulses are 2+ on the right side, and 2+ on the left side.       Posterior tibial pulses are 2+ on the right side, and 2+ on the left side.  Pulmonary/Chest: Effort normal and breath sounds normal. No  respiratory distress. He has no wheezes. He has no rales.  Musculoskeletal: He exhibits no edema.  Neurological: He is alert and oriented to person, place, and time.  Skin: Skin is warm and dry.  Psychiatric: He has a normal mood and affect. His behavior is normal. Thought content normal.          Assessment & Plan:  Diabetes type 2-clinically stable. Obtain follow-up A1c.  Hypertension-stable off of meds.  End-stage renal disease, continues hemodialysis 3 days a week.  Gout-clinically stable. I have given him a prescription for colchicine to use on an as-needed basis. Will obtain uric acid level.  Hyperlipidemia-he did not start a atorvastatin. Recent prescription to his pharmacy as he states they said they did not receive it.

## 2017-02-21 ENCOUNTER — Encounter: Payer: Self-pay | Admitting: Family

## 2017-02-21 DIAGNOSIS — I4891 Unspecified atrial fibrillation: Secondary | ICD-10-CM | POA: Diagnosis not present

## 2017-02-21 DIAGNOSIS — N186 End stage renal disease: Secondary | ICD-10-CM | POA: Diagnosis not present

## 2017-02-21 DIAGNOSIS — N2581 Secondary hyperparathyroidism of renal origin: Secondary | ICD-10-CM | POA: Diagnosis not present

## 2017-02-21 DIAGNOSIS — E8770 Fluid overload, unspecified: Secondary | ICD-10-CM | POA: Diagnosis not present

## 2017-02-21 DIAGNOSIS — D631 Anemia in chronic kidney disease: Secondary | ICD-10-CM | POA: Diagnosis not present

## 2017-02-21 LAB — HEMOGLOBIN A1C
Hgb A1c MFr Bld: 5.6 % (ref <5.7)
Mean Plasma Glucose: 114 mg/dL

## 2017-02-21 NOTE — Progress Notes (Signed)
5R

## 2017-02-22 ENCOUNTER — Telehealth: Payer: Self-pay | Admitting: Rheumatology

## 2017-02-22 NOTE — Telephone Encounter (Signed)
-----   Message from Carole Binning, LPN sent at 01/01/736  3:15 PM EDT ----- Regarding: Please schedule patient a follow up visit Please schedule patient a follow up visit for October 2018. He is currently schedule for December 2018 but will need to follow up sooner then December. Thanks!

## 2017-02-22 NOTE — Telephone Encounter (Signed)
I left a message on patients home number to call, and move return office visit up to October.

## 2017-02-23 DIAGNOSIS — N186 End stage renal disease: Secondary | ICD-10-CM | POA: Diagnosis not present

## 2017-02-23 DIAGNOSIS — I4891 Unspecified atrial fibrillation: Secondary | ICD-10-CM | POA: Diagnosis not present

## 2017-02-23 DIAGNOSIS — N2581 Secondary hyperparathyroidism of renal origin: Secondary | ICD-10-CM | POA: Diagnosis not present

## 2017-02-23 DIAGNOSIS — E8770 Fluid overload, unspecified: Secondary | ICD-10-CM | POA: Diagnosis not present

## 2017-02-23 DIAGNOSIS — D631 Anemia in chronic kidney disease: Secondary | ICD-10-CM | POA: Diagnosis not present

## 2017-02-25 DIAGNOSIS — E8770 Fluid overload, unspecified: Secondary | ICD-10-CM | POA: Diagnosis not present

## 2017-02-25 DIAGNOSIS — N2581 Secondary hyperparathyroidism of renal origin: Secondary | ICD-10-CM | POA: Diagnosis not present

## 2017-02-25 DIAGNOSIS — I4891 Unspecified atrial fibrillation: Secondary | ICD-10-CM | POA: Diagnosis not present

## 2017-02-25 DIAGNOSIS — N186 End stage renal disease: Secondary | ICD-10-CM | POA: Diagnosis not present

## 2017-02-25 DIAGNOSIS — D631 Anemia in chronic kidney disease: Secondary | ICD-10-CM | POA: Diagnosis not present

## 2017-02-27 DIAGNOSIS — E8770 Fluid overload, unspecified: Secondary | ICD-10-CM | POA: Diagnosis not present

## 2017-02-27 DIAGNOSIS — N2581 Secondary hyperparathyroidism of renal origin: Secondary | ICD-10-CM | POA: Diagnosis not present

## 2017-02-27 DIAGNOSIS — I4891 Unspecified atrial fibrillation: Secondary | ICD-10-CM | POA: Diagnosis not present

## 2017-02-27 DIAGNOSIS — D631 Anemia in chronic kidney disease: Secondary | ICD-10-CM | POA: Diagnosis not present

## 2017-02-27 DIAGNOSIS — N186 End stage renal disease: Secondary | ICD-10-CM | POA: Diagnosis not present

## 2017-02-28 DIAGNOSIS — D631 Anemia in chronic kidney disease: Secondary | ICD-10-CM | POA: Diagnosis not present

## 2017-02-28 DIAGNOSIS — E8770 Fluid overload, unspecified: Secondary | ICD-10-CM | POA: Diagnosis not present

## 2017-02-28 DIAGNOSIS — N186 End stage renal disease: Secondary | ICD-10-CM | POA: Diagnosis not present

## 2017-02-28 DIAGNOSIS — I4891 Unspecified atrial fibrillation: Secondary | ICD-10-CM | POA: Diagnosis not present

## 2017-02-28 DIAGNOSIS — N2581 Secondary hyperparathyroidism of renal origin: Secondary | ICD-10-CM | POA: Diagnosis not present

## 2017-03-02 DIAGNOSIS — I4891 Unspecified atrial fibrillation: Secondary | ICD-10-CM | POA: Diagnosis not present

## 2017-03-02 DIAGNOSIS — N186 End stage renal disease: Secondary | ICD-10-CM | POA: Diagnosis not present

## 2017-03-02 DIAGNOSIS — D631 Anemia in chronic kidney disease: Secondary | ICD-10-CM | POA: Diagnosis not present

## 2017-03-02 DIAGNOSIS — E8770 Fluid overload, unspecified: Secondary | ICD-10-CM | POA: Diagnosis not present

## 2017-03-02 DIAGNOSIS — N2581 Secondary hyperparathyroidism of renal origin: Secondary | ICD-10-CM | POA: Diagnosis not present

## 2017-03-03 ENCOUNTER — Ambulatory Visit (INDEPENDENT_AMBULATORY_CARE_PROVIDER_SITE_OTHER): Payer: Medicare Other | Admitting: *Deleted

## 2017-03-03 ENCOUNTER — Encounter (INDEPENDENT_AMBULATORY_CARE_PROVIDER_SITE_OTHER): Payer: Self-pay

## 2017-03-03 DIAGNOSIS — I4891 Unspecified atrial fibrillation: Secondary | ICD-10-CM | POA: Diagnosis not present

## 2017-03-03 DIAGNOSIS — Z5181 Encounter for therapeutic drug level monitoring: Secondary | ICD-10-CM | POA: Diagnosis not present

## 2017-03-03 LAB — POCT INR: INR: 4.8

## 2017-03-04 DIAGNOSIS — I4891 Unspecified atrial fibrillation: Secondary | ICD-10-CM | POA: Diagnosis not present

## 2017-03-04 DIAGNOSIS — N2581 Secondary hyperparathyroidism of renal origin: Secondary | ICD-10-CM | POA: Diagnosis not present

## 2017-03-04 DIAGNOSIS — E8770 Fluid overload, unspecified: Secondary | ICD-10-CM | POA: Diagnosis not present

## 2017-03-04 DIAGNOSIS — D631 Anemia in chronic kidney disease: Secondary | ICD-10-CM | POA: Diagnosis not present

## 2017-03-04 DIAGNOSIS — N186 End stage renal disease: Secondary | ICD-10-CM | POA: Diagnosis not present

## 2017-03-06 DIAGNOSIS — M0569 Rheumatoid arthritis of multiple sites with involvement of other organs and systems: Secondary | ICD-10-CM | POA: Diagnosis not present

## 2017-03-06 DIAGNOSIS — H2513 Age-related nuclear cataract, bilateral: Secondary | ICD-10-CM | POA: Diagnosis not present

## 2017-03-07 DIAGNOSIS — E8770 Fluid overload, unspecified: Secondary | ICD-10-CM | POA: Diagnosis not present

## 2017-03-07 DIAGNOSIS — D631 Anemia in chronic kidney disease: Secondary | ICD-10-CM | POA: Diagnosis not present

## 2017-03-07 DIAGNOSIS — N186 End stage renal disease: Secondary | ICD-10-CM | POA: Diagnosis not present

## 2017-03-07 DIAGNOSIS — N2581 Secondary hyperparathyroidism of renal origin: Secondary | ICD-10-CM | POA: Diagnosis not present

## 2017-03-07 DIAGNOSIS — I4891 Unspecified atrial fibrillation: Secondary | ICD-10-CM | POA: Diagnosis not present

## 2017-03-08 NOTE — Addendum Note (Signed)
Addendum  created 03/08/17 1115 by Albertha Ghee, MD   Sign clinical note

## 2017-03-09 DIAGNOSIS — N2581 Secondary hyperparathyroidism of renal origin: Secondary | ICD-10-CM | POA: Diagnosis not present

## 2017-03-09 DIAGNOSIS — N186 End stage renal disease: Secondary | ICD-10-CM | POA: Diagnosis not present

## 2017-03-09 DIAGNOSIS — D631 Anemia in chronic kidney disease: Secondary | ICD-10-CM | POA: Diagnosis not present

## 2017-03-09 DIAGNOSIS — I4891 Unspecified atrial fibrillation: Secondary | ICD-10-CM | POA: Diagnosis not present

## 2017-03-09 DIAGNOSIS — E8770 Fluid overload, unspecified: Secondary | ICD-10-CM | POA: Diagnosis not present

## 2017-03-09 LAB — PROTIME-INR

## 2017-03-11 DIAGNOSIS — D631 Anemia in chronic kidney disease: Secondary | ICD-10-CM | POA: Diagnosis not present

## 2017-03-11 DIAGNOSIS — N186 End stage renal disease: Secondary | ICD-10-CM | POA: Diagnosis not present

## 2017-03-11 DIAGNOSIS — I4891 Unspecified atrial fibrillation: Secondary | ICD-10-CM | POA: Diagnosis not present

## 2017-03-11 DIAGNOSIS — N2581 Secondary hyperparathyroidism of renal origin: Secondary | ICD-10-CM | POA: Diagnosis not present

## 2017-03-11 DIAGNOSIS — E8770 Fluid overload, unspecified: Secondary | ICD-10-CM | POA: Diagnosis not present

## 2017-03-13 DIAGNOSIS — N2581 Secondary hyperparathyroidism of renal origin: Secondary | ICD-10-CM | POA: Diagnosis not present

## 2017-03-13 DIAGNOSIS — E8770 Fluid overload, unspecified: Secondary | ICD-10-CM | POA: Diagnosis not present

## 2017-03-13 DIAGNOSIS — D631 Anemia in chronic kidney disease: Secondary | ICD-10-CM | POA: Diagnosis not present

## 2017-03-13 DIAGNOSIS — N186 End stage renal disease: Secondary | ICD-10-CM | POA: Diagnosis not present

## 2017-03-13 DIAGNOSIS — I4891 Unspecified atrial fibrillation: Secondary | ICD-10-CM | POA: Diagnosis not present

## 2017-03-14 DIAGNOSIS — E8770 Fluid overload, unspecified: Secondary | ICD-10-CM | POA: Diagnosis not present

## 2017-03-14 DIAGNOSIS — I4891 Unspecified atrial fibrillation: Secondary | ICD-10-CM | POA: Diagnosis not present

## 2017-03-14 DIAGNOSIS — N186 End stage renal disease: Secondary | ICD-10-CM | POA: Diagnosis not present

## 2017-03-14 DIAGNOSIS — D631 Anemia in chronic kidney disease: Secondary | ICD-10-CM | POA: Diagnosis not present

## 2017-03-14 DIAGNOSIS — N2581 Secondary hyperparathyroidism of renal origin: Secondary | ICD-10-CM | POA: Diagnosis not present

## 2017-03-15 ENCOUNTER — Telehealth: Payer: Self-pay | Admitting: *Deleted

## 2017-03-15 NOTE — Telephone Encounter (Signed)
Abnormal Received results from [facility]; forwarded to provider/SLS 08/29

## 2017-03-16 ENCOUNTER — Telehealth: Payer: Self-pay | Admitting: *Deleted

## 2017-03-16 DIAGNOSIS — N2581 Secondary hyperparathyroidism of renal origin: Secondary | ICD-10-CM | POA: Diagnosis not present

## 2017-03-16 DIAGNOSIS — D631 Anemia in chronic kidney disease: Secondary | ICD-10-CM | POA: Diagnosis not present

## 2017-03-16 DIAGNOSIS — I4891 Unspecified atrial fibrillation: Secondary | ICD-10-CM | POA: Diagnosis not present

## 2017-03-16 DIAGNOSIS — N186 End stage renal disease: Secondary | ICD-10-CM | POA: Diagnosis not present

## 2017-03-16 DIAGNOSIS — E8770 Fluid overload, unspecified: Secondary | ICD-10-CM | POA: Diagnosis not present

## 2017-03-16 NOTE — Telephone Encounter (Signed)
Received results from [facility]; forwarded to provider/SLS 08/30

## 2017-03-17 ENCOUNTER — Ambulatory Visit (INDEPENDENT_AMBULATORY_CARE_PROVIDER_SITE_OTHER): Payer: Medicare Other | Admitting: *Deleted

## 2017-03-17 ENCOUNTER — Encounter: Payer: Self-pay | Admitting: *Deleted

## 2017-03-17 DIAGNOSIS — I158 Other secondary hypertension: Secondary | ICD-10-CM | POA: Diagnosis not present

## 2017-03-17 DIAGNOSIS — Z79899 Other long term (current) drug therapy: Secondary | ICD-10-CM | POA: Insufficient documentation

## 2017-03-17 DIAGNOSIS — Z992 Dependence on renal dialysis: Secondary | ICD-10-CM | POA: Diagnosis not present

## 2017-03-17 DIAGNOSIS — I4891 Unspecified atrial fibrillation: Secondary | ICD-10-CM

## 2017-03-17 DIAGNOSIS — Z5181 Encounter for therapeutic drug level monitoring: Secondary | ICD-10-CM

## 2017-03-17 DIAGNOSIS — N186 End stage renal disease: Secondary | ICD-10-CM | POA: Diagnosis not present

## 2017-03-17 LAB — POCT INR: INR: 3.3

## 2017-03-18 DIAGNOSIS — N186 End stage renal disease: Secondary | ICD-10-CM | POA: Diagnosis not present

## 2017-03-18 DIAGNOSIS — Z23 Encounter for immunization: Secondary | ICD-10-CM | POA: Diagnosis not present

## 2017-03-18 DIAGNOSIS — D631 Anemia in chronic kidney disease: Secondary | ICD-10-CM | POA: Diagnosis not present

## 2017-03-18 DIAGNOSIS — N2581 Secondary hyperparathyroidism of renal origin: Secondary | ICD-10-CM | POA: Diagnosis not present

## 2017-03-18 DIAGNOSIS — I4891 Unspecified atrial fibrillation: Secondary | ICD-10-CM | POA: Diagnosis not present

## 2017-03-18 DIAGNOSIS — D509 Iron deficiency anemia, unspecified: Secondary | ICD-10-CM | POA: Diagnosis not present

## 2017-03-21 ENCOUNTER — Other Ambulatory Visit: Payer: Self-pay | Admitting: Family

## 2017-03-21 DIAGNOSIS — D631 Anemia in chronic kidney disease: Secondary | ICD-10-CM | POA: Diagnosis not present

## 2017-03-21 DIAGNOSIS — N186 End stage renal disease: Secondary | ICD-10-CM | POA: Diagnosis not present

## 2017-03-21 DIAGNOSIS — I4891 Unspecified atrial fibrillation: Secondary | ICD-10-CM | POA: Diagnosis not present

## 2017-03-21 DIAGNOSIS — D509 Iron deficiency anemia, unspecified: Secondary | ICD-10-CM | POA: Diagnosis not present

## 2017-03-21 DIAGNOSIS — Z23 Encounter for immunization: Secondary | ICD-10-CM | POA: Diagnosis not present

## 2017-03-21 DIAGNOSIS — N2581 Secondary hyperparathyroidism of renal origin: Secondary | ICD-10-CM | POA: Diagnosis not present

## 2017-03-21 NOTE — Telephone Encounter (Signed)
Called Pt to clarify how often he's taking his colchicine and to gather insight in refill request. Pt states that he's only taking Rx PRN as prescribed and doesn't need a refill as he's only taking 1/2 tablet every 3 to 5 days as needed. Refill refused.

## 2017-03-22 DIAGNOSIS — N186 End stage renal disease: Secondary | ICD-10-CM | POA: Diagnosis not present

## 2017-03-22 DIAGNOSIS — E8770 Fluid overload, unspecified: Secondary | ICD-10-CM | POA: Diagnosis not present

## 2017-03-22 DIAGNOSIS — N2581 Secondary hyperparathyroidism of renal origin: Secondary | ICD-10-CM | POA: Diagnosis not present

## 2017-03-22 DIAGNOSIS — E8779 Other fluid overload: Secondary | ICD-10-CM | POA: Diagnosis not present

## 2017-03-23 DIAGNOSIS — I4891 Unspecified atrial fibrillation: Secondary | ICD-10-CM | POA: Diagnosis not present

## 2017-03-23 DIAGNOSIS — N2581 Secondary hyperparathyroidism of renal origin: Secondary | ICD-10-CM | POA: Diagnosis not present

## 2017-03-23 DIAGNOSIS — D631 Anemia in chronic kidney disease: Secondary | ICD-10-CM | POA: Diagnosis not present

## 2017-03-23 DIAGNOSIS — D509 Iron deficiency anemia, unspecified: Secondary | ICD-10-CM | POA: Diagnosis not present

## 2017-03-23 DIAGNOSIS — Z23 Encounter for immunization: Secondary | ICD-10-CM | POA: Diagnosis not present

## 2017-03-23 DIAGNOSIS — N186 End stage renal disease: Secondary | ICD-10-CM | POA: Diagnosis not present

## 2017-03-25 DIAGNOSIS — I4891 Unspecified atrial fibrillation: Secondary | ICD-10-CM | POA: Diagnosis not present

## 2017-03-25 DIAGNOSIS — N2581 Secondary hyperparathyroidism of renal origin: Secondary | ICD-10-CM | POA: Diagnosis not present

## 2017-03-25 DIAGNOSIS — D631 Anemia in chronic kidney disease: Secondary | ICD-10-CM | POA: Diagnosis not present

## 2017-03-25 DIAGNOSIS — D509 Iron deficiency anemia, unspecified: Secondary | ICD-10-CM | POA: Diagnosis not present

## 2017-03-25 DIAGNOSIS — N186 End stage renal disease: Secondary | ICD-10-CM | POA: Diagnosis not present

## 2017-03-25 DIAGNOSIS — Z23 Encounter for immunization: Secondary | ICD-10-CM | POA: Diagnosis not present

## 2017-03-28 DIAGNOSIS — D631 Anemia in chronic kidney disease: Secondary | ICD-10-CM | POA: Diagnosis not present

## 2017-03-28 DIAGNOSIS — N186 End stage renal disease: Secondary | ICD-10-CM | POA: Diagnosis not present

## 2017-03-28 DIAGNOSIS — D509 Iron deficiency anemia, unspecified: Secondary | ICD-10-CM | POA: Diagnosis not present

## 2017-03-28 DIAGNOSIS — I4891 Unspecified atrial fibrillation: Secondary | ICD-10-CM | POA: Diagnosis not present

## 2017-03-28 DIAGNOSIS — N2581 Secondary hyperparathyroidism of renal origin: Secondary | ICD-10-CM | POA: Diagnosis not present

## 2017-03-28 DIAGNOSIS — Z23 Encounter for immunization: Secondary | ICD-10-CM | POA: Diagnosis not present

## 2017-03-29 DIAGNOSIS — E8770 Fluid overload, unspecified: Secondary | ICD-10-CM | POA: Diagnosis not present

## 2017-03-29 DIAGNOSIS — E8779 Other fluid overload: Secondary | ICD-10-CM | POA: Diagnosis not present

## 2017-03-29 DIAGNOSIS — N2581 Secondary hyperparathyroidism of renal origin: Secondary | ICD-10-CM | POA: Diagnosis not present

## 2017-03-29 DIAGNOSIS — N186 End stage renal disease: Secondary | ICD-10-CM | POA: Diagnosis not present

## 2017-03-30 DIAGNOSIS — N186 End stage renal disease: Secondary | ICD-10-CM | POA: Diagnosis not present

## 2017-03-30 DIAGNOSIS — I4891 Unspecified atrial fibrillation: Secondary | ICD-10-CM | POA: Diagnosis not present

## 2017-03-30 DIAGNOSIS — D509 Iron deficiency anemia, unspecified: Secondary | ICD-10-CM | POA: Diagnosis not present

## 2017-03-30 DIAGNOSIS — D631 Anemia in chronic kidney disease: Secondary | ICD-10-CM | POA: Diagnosis not present

## 2017-03-30 DIAGNOSIS — N2581 Secondary hyperparathyroidism of renal origin: Secondary | ICD-10-CM | POA: Diagnosis not present

## 2017-03-30 DIAGNOSIS — Z23 Encounter for immunization: Secondary | ICD-10-CM | POA: Diagnosis not present

## 2017-03-31 ENCOUNTER — Ambulatory Visit (INDEPENDENT_AMBULATORY_CARE_PROVIDER_SITE_OTHER): Payer: Medicare Other | Admitting: *Deleted

## 2017-03-31 ENCOUNTER — Encounter (INDEPENDENT_AMBULATORY_CARE_PROVIDER_SITE_OTHER): Payer: Self-pay

## 2017-03-31 DIAGNOSIS — I4891 Unspecified atrial fibrillation: Secondary | ICD-10-CM

## 2017-03-31 DIAGNOSIS — Z5181 Encounter for therapeutic drug level monitoring: Secondary | ICD-10-CM

## 2017-03-31 LAB — POCT INR: INR: 1.5

## 2017-04-01 DIAGNOSIS — Z23 Encounter for immunization: Secondary | ICD-10-CM | POA: Diagnosis not present

## 2017-04-01 DIAGNOSIS — N186 End stage renal disease: Secondary | ICD-10-CM | POA: Diagnosis not present

## 2017-04-01 DIAGNOSIS — N2581 Secondary hyperparathyroidism of renal origin: Secondary | ICD-10-CM | POA: Diagnosis not present

## 2017-04-01 DIAGNOSIS — D631 Anemia in chronic kidney disease: Secondary | ICD-10-CM | POA: Diagnosis not present

## 2017-04-01 DIAGNOSIS — I4891 Unspecified atrial fibrillation: Secondary | ICD-10-CM | POA: Diagnosis not present

## 2017-04-01 DIAGNOSIS — D509 Iron deficiency anemia, unspecified: Secondary | ICD-10-CM | POA: Diagnosis not present

## 2017-04-04 DIAGNOSIS — Z23 Encounter for immunization: Secondary | ICD-10-CM | POA: Diagnosis not present

## 2017-04-04 DIAGNOSIS — D631 Anemia in chronic kidney disease: Secondary | ICD-10-CM | POA: Diagnosis not present

## 2017-04-04 DIAGNOSIS — N2581 Secondary hyperparathyroidism of renal origin: Secondary | ICD-10-CM | POA: Diagnosis not present

## 2017-04-04 DIAGNOSIS — D509 Iron deficiency anemia, unspecified: Secondary | ICD-10-CM | POA: Diagnosis not present

## 2017-04-04 DIAGNOSIS — I4891 Unspecified atrial fibrillation: Secondary | ICD-10-CM | POA: Diagnosis not present

## 2017-04-04 DIAGNOSIS — N186 End stage renal disease: Secondary | ICD-10-CM | POA: Diagnosis not present

## 2017-04-06 DIAGNOSIS — I4891 Unspecified atrial fibrillation: Secondary | ICD-10-CM | POA: Diagnosis not present

## 2017-04-06 DIAGNOSIS — N2581 Secondary hyperparathyroidism of renal origin: Secondary | ICD-10-CM | POA: Diagnosis not present

## 2017-04-06 DIAGNOSIS — D509 Iron deficiency anemia, unspecified: Secondary | ICD-10-CM | POA: Diagnosis not present

## 2017-04-06 DIAGNOSIS — Z23 Encounter for immunization: Secondary | ICD-10-CM | POA: Diagnosis not present

## 2017-04-06 DIAGNOSIS — D631 Anemia in chronic kidney disease: Secondary | ICD-10-CM | POA: Diagnosis not present

## 2017-04-06 DIAGNOSIS — N186 End stage renal disease: Secondary | ICD-10-CM | POA: Diagnosis not present

## 2017-04-07 DIAGNOSIS — N2581 Secondary hyperparathyroidism of renal origin: Secondary | ICD-10-CM | POA: Diagnosis not present

## 2017-04-07 DIAGNOSIS — D631 Anemia in chronic kidney disease: Secondary | ICD-10-CM | POA: Diagnosis not present

## 2017-04-07 DIAGNOSIS — N186 End stage renal disease: Secondary | ICD-10-CM | POA: Diagnosis not present

## 2017-04-07 DIAGNOSIS — D509 Iron deficiency anemia, unspecified: Secondary | ICD-10-CM | POA: Diagnosis not present

## 2017-04-07 DIAGNOSIS — Z23 Encounter for immunization: Secondary | ICD-10-CM | POA: Diagnosis not present

## 2017-04-07 DIAGNOSIS — I4891 Unspecified atrial fibrillation: Secondary | ICD-10-CM | POA: Diagnosis not present

## 2017-04-10 ENCOUNTER — Ambulatory Visit (INDEPENDENT_AMBULATORY_CARE_PROVIDER_SITE_OTHER): Payer: Medicare Other

## 2017-04-10 DIAGNOSIS — Z5181 Encounter for therapeutic drug level monitoring: Secondary | ICD-10-CM

## 2017-04-10 DIAGNOSIS — I4891 Unspecified atrial fibrillation: Secondary | ICD-10-CM

## 2017-04-10 LAB — POCT INR: INR: 2.1

## 2017-04-11 DIAGNOSIS — D631 Anemia in chronic kidney disease: Secondary | ICD-10-CM | POA: Diagnosis not present

## 2017-04-11 DIAGNOSIS — D509 Iron deficiency anemia, unspecified: Secondary | ICD-10-CM | POA: Diagnosis not present

## 2017-04-11 DIAGNOSIS — I4891 Unspecified atrial fibrillation: Secondary | ICD-10-CM | POA: Diagnosis not present

## 2017-04-11 DIAGNOSIS — N2581 Secondary hyperparathyroidism of renal origin: Secondary | ICD-10-CM | POA: Diagnosis not present

## 2017-04-11 DIAGNOSIS — Z23 Encounter for immunization: Secondary | ICD-10-CM | POA: Diagnosis not present

## 2017-04-11 DIAGNOSIS — N186 End stage renal disease: Secondary | ICD-10-CM | POA: Diagnosis not present

## 2017-04-12 DIAGNOSIS — E8779 Other fluid overload: Secondary | ICD-10-CM | POA: Diagnosis not present

## 2017-04-12 DIAGNOSIS — N2581 Secondary hyperparathyroidism of renal origin: Secondary | ICD-10-CM | POA: Diagnosis not present

## 2017-04-12 DIAGNOSIS — N186 End stage renal disease: Secondary | ICD-10-CM | POA: Diagnosis not present

## 2017-04-12 DIAGNOSIS — E8770 Fluid overload, unspecified: Secondary | ICD-10-CM | POA: Diagnosis not present

## 2017-04-13 DIAGNOSIS — D509 Iron deficiency anemia, unspecified: Secondary | ICD-10-CM | POA: Diagnosis not present

## 2017-04-13 DIAGNOSIS — N2581 Secondary hyperparathyroidism of renal origin: Secondary | ICD-10-CM | POA: Diagnosis not present

## 2017-04-13 DIAGNOSIS — Z23 Encounter for immunization: Secondary | ICD-10-CM | POA: Diagnosis not present

## 2017-04-13 DIAGNOSIS — D631 Anemia in chronic kidney disease: Secondary | ICD-10-CM | POA: Diagnosis not present

## 2017-04-13 DIAGNOSIS — I4891 Unspecified atrial fibrillation: Secondary | ICD-10-CM | POA: Diagnosis not present

## 2017-04-13 DIAGNOSIS — N186 End stage renal disease: Secondary | ICD-10-CM | POA: Diagnosis not present

## 2017-04-14 LAB — PROTIME-INR

## 2017-04-15 DIAGNOSIS — D631 Anemia in chronic kidney disease: Secondary | ICD-10-CM | POA: Diagnosis not present

## 2017-04-15 DIAGNOSIS — N2581 Secondary hyperparathyroidism of renal origin: Secondary | ICD-10-CM | POA: Diagnosis not present

## 2017-04-15 DIAGNOSIS — D509 Iron deficiency anemia, unspecified: Secondary | ICD-10-CM | POA: Diagnosis not present

## 2017-04-15 DIAGNOSIS — I4891 Unspecified atrial fibrillation: Secondary | ICD-10-CM | POA: Diagnosis not present

## 2017-04-15 DIAGNOSIS — Z23 Encounter for immunization: Secondary | ICD-10-CM | POA: Diagnosis not present

## 2017-04-15 DIAGNOSIS — N186 End stage renal disease: Secondary | ICD-10-CM | POA: Diagnosis not present

## 2017-04-16 DIAGNOSIS — Z992 Dependence on renal dialysis: Secondary | ICD-10-CM | POA: Diagnosis not present

## 2017-04-16 DIAGNOSIS — I158 Other secondary hypertension: Secondary | ICD-10-CM | POA: Diagnosis not present

## 2017-04-16 DIAGNOSIS — N186 End stage renal disease: Secondary | ICD-10-CM | POA: Diagnosis not present

## 2017-04-17 ENCOUNTER — Telehealth: Payer: Self-pay | Admitting: *Deleted

## 2017-04-17 NOTE — Telephone Encounter (Signed)
Received Abnormal results from [facility]; forwarded to provider/SLS 10/01

## 2017-04-18 DIAGNOSIS — I4891 Unspecified atrial fibrillation: Secondary | ICD-10-CM | POA: Diagnosis not present

## 2017-04-18 DIAGNOSIS — D631 Anemia in chronic kidney disease: Secondary | ICD-10-CM | POA: Diagnosis not present

## 2017-04-18 DIAGNOSIS — N186 End stage renal disease: Secondary | ICD-10-CM | POA: Diagnosis not present

## 2017-04-18 DIAGNOSIS — N2581 Secondary hyperparathyroidism of renal origin: Secondary | ICD-10-CM | POA: Diagnosis not present

## 2017-04-18 DIAGNOSIS — D509 Iron deficiency anemia, unspecified: Secondary | ICD-10-CM | POA: Diagnosis not present

## 2017-04-20 DIAGNOSIS — N186 End stage renal disease: Secondary | ICD-10-CM | POA: Diagnosis not present

## 2017-04-20 DIAGNOSIS — N2581 Secondary hyperparathyroidism of renal origin: Secondary | ICD-10-CM | POA: Diagnosis not present

## 2017-04-20 DIAGNOSIS — I4891 Unspecified atrial fibrillation: Secondary | ICD-10-CM | POA: Diagnosis not present

## 2017-04-20 DIAGNOSIS — D631 Anemia in chronic kidney disease: Secondary | ICD-10-CM | POA: Diagnosis not present

## 2017-04-20 DIAGNOSIS — D509 Iron deficiency anemia, unspecified: Secondary | ICD-10-CM | POA: Diagnosis not present

## 2017-04-22 DIAGNOSIS — N2581 Secondary hyperparathyroidism of renal origin: Secondary | ICD-10-CM | POA: Diagnosis not present

## 2017-04-22 DIAGNOSIS — N186 End stage renal disease: Secondary | ICD-10-CM | POA: Diagnosis not present

## 2017-04-22 DIAGNOSIS — I4891 Unspecified atrial fibrillation: Secondary | ICD-10-CM | POA: Diagnosis not present

## 2017-04-22 DIAGNOSIS — D631 Anemia in chronic kidney disease: Secondary | ICD-10-CM | POA: Diagnosis not present

## 2017-04-22 DIAGNOSIS — D509 Iron deficiency anemia, unspecified: Secondary | ICD-10-CM | POA: Diagnosis not present

## 2017-04-25 DIAGNOSIS — D631 Anemia in chronic kidney disease: Secondary | ICD-10-CM | POA: Diagnosis not present

## 2017-04-25 DIAGNOSIS — N186 End stage renal disease: Secondary | ICD-10-CM | POA: Diagnosis not present

## 2017-04-25 DIAGNOSIS — I4891 Unspecified atrial fibrillation: Secondary | ICD-10-CM | POA: Diagnosis not present

## 2017-04-25 DIAGNOSIS — D509 Iron deficiency anemia, unspecified: Secondary | ICD-10-CM | POA: Diagnosis not present

## 2017-04-25 DIAGNOSIS — N2581 Secondary hyperparathyroidism of renal origin: Secondary | ICD-10-CM | POA: Diagnosis not present

## 2017-04-27 DIAGNOSIS — N2581 Secondary hyperparathyroidism of renal origin: Secondary | ICD-10-CM | POA: Diagnosis not present

## 2017-04-27 DIAGNOSIS — N186 End stage renal disease: Secondary | ICD-10-CM | POA: Diagnosis not present

## 2017-04-27 DIAGNOSIS — D509 Iron deficiency anemia, unspecified: Secondary | ICD-10-CM | POA: Diagnosis not present

## 2017-04-27 DIAGNOSIS — I4891 Unspecified atrial fibrillation: Secondary | ICD-10-CM | POA: Diagnosis not present

## 2017-04-27 DIAGNOSIS — D631 Anemia in chronic kidney disease: Secondary | ICD-10-CM | POA: Diagnosis not present

## 2017-04-29 DIAGNOSIS — D509 Iron deficiency anemia, unspecified: Secondary | ICD-10-CM | POA: Diagnosis not present

## 2017-04-29 DIAGNOSIS — I4891 Unspecified atrial fibrillation: Secondary | ICD-10-CM | POA: Diagnosis not present

## 2017-04-29 DIAGNOSIS — D631 Anemia in chronic kidney disease: Secondary | ICD-10-CM | POA: Diagnosis not present

## 2017-04-29 DIAGNOSIS — N186 End stage renal disease: Secondary | ICD-10-CM | POA: Diagnosis not present

## 2017-04-29 DIAGNOSIS — N2581 Secondary hyperparathyroidism of renal origin: Secondary | ICD-10-CM | POA: Diagnosis not present

## 2017-05-01 ENCOUNTER — Ambulatory Visit (INDEPENDENT_AMBULATORY_CARE_PROVIDER_SITE_OTHER): Payer: Medicare Other

## 2017-05-01 DIAGNOSIS — I4891 Unspecified atrial fibrillation: Secondary | ICD-10-CM

## 2017-05-01 DIAGNOSIS — Z5181 Encounter for therapeutic drug level monitoring: Secondary | ICD-10-CM

## 2017-05-01 LAB — POCT INR: INR: 1.9

## 2017-05-02 DIAGNOSIS — D509 Iron deficiency anemia, unspecified: Secondary | ICD-10-CM | POA: Diagnosis not present

## 2017-05-02 DIAGNOSIS — N2581 Secondary hyperparathyroidism of renal origin: Secondary | ICD-10-CM | POA: Diagnosis not present

## 2017-05-02 DIAGNOSIS — N186 End stage renal disease: Secondary | ICD-10-CM | POA: Diagnosis not present

## 2017-05-02 DIAGNOSIS — D631 Anemia in chronic kidney disease: Secondary | ICD-10-CM | POA: Diagnosis not present

## 2017-05-02 DIAGNOSIS — I4891 Unspecified atrial fibrillation: Secondary | ICD-10-CM | POA: Diagnosis not present

## 2017-05-04 DIAGNOSIS — I4891 Unspecified atrial fibrillation: Secondary | ICD-10-CM | POA: Diagnosis not present

## 2017-05-04 DIAGNOSIS — D509 Iron deficiency anemia, unspecified: Secondary | ICD-10-CM | POA: Diagnosis not present

## 2017-05-04 DIAGNOSIS — D631 Anemia in chronic kidney disease: Secondary | ICD-10-CM | POA: Diagnosis not present

## 2017-05-04 DIAGNOSIS — N2581 Secondary hyperparathyroidism of renal origin: Secondary | ICD-10-CM | POA: Diagnosis not present

## 2017-05-04 DIAGNOSIS — N186 End stage renal disease: Secondary | ICD-10-CM | POA: Diagnosis not present

## 2017-05-06 DIAGNOSIS — D509 Iron deficiency anemia, unspecified: Secondary | ICD-10-CM | POA: Diagnosis not present

## 2017-05-06 DIAGNOSIS — D631 Anemia in chronic kidney disease: Secondary | ICD-10-CM | POA: Diagnosis not present

## 2017-05-06 DIAGNOSIS — I4891 Unspecified atrial fibrillation: Secondary | ICD-10-CM | POA: Diagnosis not present

## 2017-05-06 DIAGNOSIS — N2581 Secondary hyperparathyroidism of renal origin: Secondary | ICD-10-CM | POA: Diagnosis not present

## 2017-05-06 DIAGNOSIS — N186 End stage renal disease: Secondary | ICD-10-CM | POA: Diagnosis not present

## 2017-05-09 DIAGNOSIS — N186 End stage renal disease: Secondary | ICD-10-CM | POA: Diagnosis not present

## 2017-05-09 DIAGNOSIS — I4891 Unspecified atrial fibrillation: Secondary | ICD-10-CM | POA: Diagnosis not present

## 2017-05-09 DIAGNOSIS — N2581 Secondary hyperparathyroidism of renal origin: Secondary | ICD-10-CM | POA: Diagnosis not present

## 2017-05-09 DIAGNOSIS — D631 Anemia in chronic kidney disease: Secondary | ICD-10-CM | POA: Diagnosis not present

## 2017-05-09 DIAGNOSIS — D509 Iron deficiency anemia, unspecified: Secondary | ICD-10-CM | POA: Diagnosis not present

## 2017-05-11 ENCOUNTER — Encounter: Payer: Self-pay | Admitting: Family

## 2017-05-11 DIAGNOSIS — I4891 Unspecified atrial fibrillation: Secondary | ICD-10-CM | POA: Diagnosis not present

## 2017-05-11 DIAGNOSIS — D509 Iron deficiency anemia, unspecified: Secondary | ICD-10-CM | POA: Diagnosis not present

## 2017-05-11 DIAGNOSIS — N2581 Secondary hyperparathyroidism of renal origin: Secondary | ICD-10-CM | POA: Diagnosis not present

## 2017-05-11 DIAGNOSIS — N186 End stage renal disease: Secondary | ICD-10-CM | POA: Diagnosis not present

## 2017-05-11 DIAGNOSIS — D631 Anemia in chronic kidney disease: Secondary | ICD-10-CM | POA: Diagnosis not present

## 2017-05-11 LAB — PROTIME-INR

## 2017-05-13 DIAGNOSIS — D509 Iron deficiency anemia, unspecified: Secondary | ICD-10-CM | POA: Diagnosis not present

## 2017-05-13 DIAGNOSIS — D631 Anemia in chronic kidney disease: Secondary | ICD-10-CM | POA: Diagnosis not present

## 2017-05-13 DIAGNOSIS — I4891 Unspecified atrial fibrillation: Secondary | ICD-10-CM | POA: Diagnosis not present

## 2017-05-13 DIAGNOSIS — N186 End stage renal disease: Secondary | ICD-10-CM | POA: Diagnosis not present

## 2017-05-13 DIAGNOSIS — N2581 Secondary hyperparathyroidism of renal origin: Secondary | ICD-10-CM | POA: Diagnosis not present

## 2017-05-15 DIAGNOSIS — N186 End stage renal disease: Secondary | ICD-10-CM | POA: Diagnosis not present

## 2017-05-15 DIAGNOSIS — E8770 Fluid overload, unspecified: Secondary | ICD-10-CM | POA: Diagnosis not present

## 2017-05-15 DIAGNOSIS — N2581 Secondary hyperparathyroidism of renal origin: Secondary | ICD-10-CM | POA: Diagnosis not present

## 2017-05-16 ENCOUNTER — Telehealth: Payer: Self-pay | Admitting: *Deleted

## 2017-05-16 DIAGNOSIS — I4891 Unspecified atrial fibrillation: Secondary | ICD-10-CM | POA: Diagnosis not present

## 2017-05-16 DIAGNOSIS — D631 Anemia in chronic kidney disease: Secondary | ICD-10-CM | POA: Diagnosis not present

## 2017-05-16 DIAGNOSIS — N186 End stage renal disease: Secondary | ICD-10-CM | POA: Diagnosis not present

## 2017-05-16 DIAGNOSIS — N2581 Secondary hyperparathyroidism of renal origin: Secondary | ICD-10-CM | POA: Diagnosis not present

## 2017-05-16 DIAGNOSIS — D509 Iron deficiency anemia, unspecified: Secondary | ICD-10-CM | POA: Diagnosis not present

## 2017-05-16 NOTE — Telephone Encounter (Signed)
Received PT/INR results from [facility]; forwarded to provider/SLS 10/30

## 2017-05-16 NOTE — Telephone Encounter (Signed)
Received results from [facility]; forwarded to provider/SLS 10/30

## 2017-05-17 DIAGNOSIS — N186 End stage renal disease: Secondary | ICD-10-CM | POA: Diagnosis not present

## 2017-05-17 DIAGNOSIS — I158 Other secondary hypertension: Secondary | ICD-10-CM | POA: Diagnosis not present

## 2017-05-17 DIAGNOSIS — Z992 Dependence on renal dialysis: Secondary | ICD-10-CM | POA: Diagnosis not present

## 2017-05-18 DIAGNOSIS — D631 Anemia in chronic kidney disease: Secondary | ICD-10-CM | POA: Diagnosis not present

## 2017-05-18 DIAGNOSIS — N2581 Secondary hyperparathyroidism of renal origin: Secondary | ICD-10-CM | POA: Diagnosis not present

## 2017-05-18 DIAGNOSIS — D509 Iron deficiency anemia, unspecified: Secondary | ICD-10-CM | POA: Diagnosis not present

## 2017-05-18 DIAGNOSIS — N186 End stage renal disease: Secondary | ICD-10-CM | POA: Diagnosis not present

## 2017-05-19 ENCOUNTER — Other Ambulatory Visit: Payer: Self-pay | Admitting: Family

## 2017-05-20 DIAGNOSIS — N2581 Secondary hyperparathyroidism of renal origin: Secondary | ICD-10-CM | POA: Diagnosis not present

## 2017-05-20 DIAGNOSIS — N186 End stage renal disease: Secondary | ICD-10-CM | POA: Diagnosis not present

## 2017-05-20 DIAGNOSIS — D509 Iron deficiency anemia, unspecified: Secondary | ICD-10-CM | POA: Diagnosis not present

## 2017-05-20 DIAGNOSIS — D631 Anemia in chronic kidney disease: Secondary | ICD-10-CM | POA: Diagnosis not present

## 2017-05-22 DIAGNOSIS — N2581 Secondary hyperparathyroidism of renal origin: Secondary | ICD-10-CM | POA: Diagnosis not present

## 2017-05-22 DIAGNOSIS — N186 End stage renal disease: Secondary | ICD-10-CM | POA: Diagnosis not present

## 2017-05-22 DIAGNOSIS — D631 Anemia in chronic kidney disease: Secondary | ICD-10-CM | POA: Diagnosis not present

## 2017-05-22 DIAGNOSIS — D509 Iron deficiency anemia, unspecified: Secondary | ICD-10-CM | POA: Diagnosis not present

## 2017-05-24 DIAGNOSIS — N2581 Secondary hyperparathyroidism of renal origin: Secondary | ICD-10-CM | POA: Diagnosis not present

## 2017-05-24 DIAGNOSIS — D631 Anemia in chronic kidney disease: Secondary | ICD-10-CM | POA: Diagnosis not present

## 2017-05-24 DIAGNOSIS — N186 End stage renal disease: Secondary | ICD-10-CM | POA: Diagnosis not present

## 2017-05-24 DIAGNOSIS — D509 Iron deficiency anemia, unspecified: Secondary | ICD-10-CM | POA: Diagnosis not present

## 2017-05-26 DIAGNOSIS — N2581 Secondary hyperparathyroidism of renal origin: Secondary | ICD-10-CM | POA: Diagnosis not present

## 2017-05-26 DIAGNOSIS — D509 Iron deficiency anemia, unspecified: Secondary | ICD-10-CM | POA: Diagnosis not present

## 2017-05-26 DIAGNOSIS — D631 Anemia in chronic kidney disease: Secondary | ICD-10-CM | POA: Diagnosis not present

## 2017-05-26 DIAGNOSIS — N186 End stage renal disease: Secondary | ICD-10-CM | POA: Diagnosis not present

## 2017-05-29 DIAGNOSIS — N186 End stage renal disease: Secondary | ICD-10-CM | POA: Diagnosis not present

## 2017-05-29 DIAGNOSIS — D631 Anemia in chronic kidney disease: Secondary | ICD-10-CM | POA: Diagnosis not present

## 2017-05-29 DIAGNOSIS — N2581 Secondary hyperparathyroidism of renal origin: Secondary | ICD-10-CM | POA: Diagnosis not present

## 2017-05-29 DIAGNOSIS — D509 Iron deficiency anemia, unspecified: Secondary | ICD-10-CM | POA: Diagnosis not present

## 2017-05-31 DIAGNOSIS — D509 Iron deficiency anemia, unspecified: Secondary | ICD-10-CM | POA: Diagnosis not present

## 2017-05-31 DIAGNOSIS — N186 End stage renal disease: Secondary | ICD-10-CM | POA: Diagnosis not present

## 2017-05-31 DIAGNOSIS — D631 Anemia in chronic kidney disease: Secondary | ICD-10-CM | POA: Diagnosis not present

## 2017-05-31 DIAGNOSIS — N2581 Secondary hyperparathyroidism of renal origin: Secondary | ICD-10-CM | POA: Diagnosis not present

## 2017-06-02 DIAGNOSIS — N186 End stage renal disease: Secondary | ICD-10-CM | POA: Diagnosis not present

## 2017-06-02 DIAGNOSIS — D631 Anemia in chronic kidney disease: Secondary | ICD-10-CM | POA: Diagnosis not present

## 2017-06-02 DIAGNOSIS — N2581 Secondary hyperparathyroidism of renal origin: Secondary | ICD-10-CM | POA: Diagnosis not present

## 2017-06-02 DIAGNOSIS — D509 Iron deficiency anemia, unspecified: Secondary | ICD-10-CM | POA: Diagnosis not present

## 2017-06-04 DIAGNOSIS — D509 Iron deficiency anemia, unspecified: Secondary | ICD-10-CM | POA: Diagnosis not present

## 2017-06-04 DIAGNOSIS — N2581 Secondary hyperparathyroidism of renal origin: Secondary | ICD-10-CM | POA: Diagnosis not present

## 2017-06-04 DIAGNOSIS — D631 Anemia in chronic kidney disease: Secondary | ICD-10-CM | POA: Diagnosis not present

## 2017-06-04 DIAGNOSIS — N186 End stage renal disease: Secondary | ICD-10-CM | POA: Diagnosis not present

## 2017-06-06 DIAGNOSIS — N2581 Secondary hyperparathyroidism of renal origin: Secondary | ICD-10-CM | POA: Diagnosis not present

## 2017-06-06 DIAGNOSIS — D509 Iron deficiency anemia, unspecified: Secondary | ICD-10-CM | POA: Diagnosis not present

## 2017-06-06 DIAGNOSIS — D631 Anemia in chronic kidney disease: Secondary | ICD-10-CM | POA: Diagnosis not present

## 2017-06-06 DIAGNOSIS — N186 End stage renal disease: Secondary | ICD-10-CM | POA: Diagnosis not present

## 2017-06-09 DIAGNOSIS — N186 End stage renal disease: Secondary | ICD-10-CM | POA: Diagnosis not present

## 2017-06-09 DIAGNOSIS — N2581 Secondary hyperparathyroidism of renal origin: Secondary | ICD-10-CM | POA: Diagnosis not present

## 2017-06-09 DIAGNOSIS — D509 Iron deficiency anemia, unspecified: Secondary | ICD-10-CM | POA: Diagnosis not present

## 2017-06-09 DIAGNOSIS — D631 Anemia in chronic kidney disease: Secondary | ICD-10-CM | POA: Diagnosis not present

## 2017-06-12 DIAGNOSIS — N2581 Secondary hyperparathyroidism of renal origin: Secondary | ICD-10-CM | POA: Diagnosis not present

## 2017-06-12 DIAGNOSIS — D631 Anemia in chronic kidney disease: Secondary | ICD-10-CM | POA: Diagnosis not present

## 2017-06-12 DIAGNOSIS — D509 Iron deficiency anemia, unspecified: Secondary | ICD-10-CM | POA: Diagnosis not present

## 2017-06-12 DIAGNOSIS — N186 End stage renal disease: Secondary | ICD-10-CM | POA: Diagnosis not present

## 2017-06-14 DIAGNOSIS — D509 Iron deficiency anemia, unspecified: Secondary | ICD-10-CM | POA: Diagnosis not present

## 2017-06-14 DIAGNOSIS — N2581 Secondary hyperparathyroidism of renal origin: Secondary | ICD-10-CM | POA: Diagnosis not present

## 2017-06-14 DIAGNOSIS — N186 End stage renal disease: Secondary | ICD-10-CM | POA: Diagnosis not present

## 2017-06-14 DIAGNOSIS — D631 Anemia in chronic kidney disease: Secondary | ICD-10-CM | POA: Diagnosis not present

## 2017-06-16 DIAGNOSIS — N186 End stage renal disease: Secondary | ICD-10-CM | POA: Diagnosis not present

## 2017-06-16 DIAGNOSIS — D509 Iron deficiency anemia, unspecified: Secondary | ICD-10-CM | POA: Diagnosis not present

## 2017-06-16 DIAGNOSIS — I158 Other secondary hypertension: Secondary | ICD-10-CM | POA: Diagnosis not present

## 2017-06-16 DIAGNOSIS — Z992 Dependence on renal dialysis: Secondary | ICD-10-CM | POA: Diagnosis not present

## 2017-06-16 DIAGNOSIS — N2581 Secondary hyperparathyroidism of renal origin: Secondary | ICD-10-CM | POA: Diagnosis not present

## 2017-06-16 DIAGNOSIS — D631 Anemia in chronic kidney disease: Secondary | ICD-10-CM | POA: Diagnosis not present

## 2017-06-19 DIAGNOSIS — D631 Anemia in chronic kidney disease: Secondary | ICD-10-CM | POA: Diagnosis not present

## 2017-06-19 DIAGNOSIS — I4891 Unspecified atrial fibrillation: Secondary | ICD-10-CM | POA: Diagnosis not present

## 2017-06-19 DIAGNOSIS — N186 End stage renal disease: Secondary | ICD-10-CM | POA: Diagnosis not present

## 2017-06-19 DIAGNOSIS — E8770 Fluid overload, unspecified: Secondary | ICD-10-CM | POA: Diagnosis not present

## 2017-06-19 DIAGNOSIS — D509 Iron deficiency anemia, unspecified: Secondary | ICD-10-CM | POA: Diagnosis not present

## 2017-06-19 DIAGNOSIS — N2581 Secondary hyperparathyroidism of renal origin: Secondary | ICD-10-CM | POA: Diagnosis not present

## 2017-06-19 NOTE — Progress Notes (Deleted)
Office Visit Note  Patient: Bruce Cole             Date of Birth: August 09, 1940           MRN: [id number]             PCP: Debbrah Alar, NP Referring: Debbrah Alar, NP Visit Date: 06/30/2017 Occupation: @GUAROCC @    Subjective:  No chief complaint on file.   History of Present Illness: Bruce Cole is a 76 y.o. male ***   Activities of Daily Living:  Patient reports morning stiffness for *** {minute/hour:19697}.   Patient {ACTIONS;DENIES/REPORTS:21021675::"Denies"} nocturnal pain.  Difficulty dressing/grooming: {ACTIONS;DENIES/REPORTS:21021675::"Denies"} Difficulty climbing stairs: {ACTIONS;DENIES/REPORTS:21021675::"Denies"} Difficulty getting out of chair: {ACTIONS;DENIES/REPORTS:21021675::"Denies"} Difficulty using hands for taps, buttons, cutlery, and/or writing: {ACTIONS;DENIES/REPORTS:21021675::"Denies"}   No Rheumatology ROS completed.   PMFS History:  Patient Active Problem List   Diagnosis Date Noted  . High risk medication use 03/17/2017  . Hyperkalemia 08/01/2016  . Aftercare following surgery of the circulatory system 06/17/2016  . Status post total replacement of right hip 03/22/2016  . ESRD on dialysis ([place]) 11/27/2015  . Encounter for therapeutic drug monitoring 09/02/2013  . OSA (obstructive sleep apnea) 09/02/2013  . Chronic combined systolic and diastolic heart failure ([place]) 03/16/2013  . Allergic rhinitis 12/18/2012  . Long term current use of anticoagulant therapy 11/25/2010  . Genital herpes 05/31/2010  . DM (diabetes mellitus), type 2 with renal complications ([place]) 10/62/6948  . ERECTILE DYSFUNCTION, ORGANIC 09/21/2009  . Osteoarthritis 09/21/2009  . PERSONAL HX COLONIC POLYPS 08/25/2009  . Gout 07/22/2009  . Essential hypertension 07/22/2009  . ATRIAL FIBRILLATION 07/22/2009  . Rheumatoid arthritis ([place]) 07/22/2009  . NEPHROLITHIASIS, HX OF 07/22/2009    Past Medical History:  Diagnosis Date  . Acute blood loss anemia    . Atrial fibrillation (HCC)    Chronic  . Chronic combined systolic and diastolic heart failure ([place])   . Colon polyp 2000  . Dysrhythmia    hx  . ESRD on hemodialysis ([place])    [place]  . Essential hypertension   . Gastritis and gastroduodenitis   . GI bleed   . Gout   . Heart murmur   . History of blood transfusion   . History of kidney stones   . Nephrolithiasis   . OSA (obstructive sleep apnea) 09/02/2013    IMPRESSION :  1. Mild obstructive sleep apnea with hypopneas causing sleep fragmentation and moderate oxygen desaturation.  2. Short runs of nonsustained VT were noted. His beta blocker may need to be titrated 3. Significant PLM's were noted, the PLM arousal index was low. Please correlate with clinical history of restless leg syndrome.  4. Sleep efficiency was poor.  RECOMMENDATION:  1. Treatment options for this degree of sleep disordered breathing include weight loss and positional therapy to avoid supine sleep 2. Consider titrating beta blocker further, defer to cardiologist 3. Patient should be cautioned against driving when sleepy.They should be asked to avoid medications with sedative side effects     . Osteoarthritis of right hip   . Pneumonia    hx 30 yrs ago  . Primary osteoarthritis of right hip   . Rheumatoid arthritis ([place])   . Thrombocytopenia ([place])     Family History  Problem Relation Age of Onset  . Hypertension Mother   . Arthritis Mother        ?RA  . Hypertension Father    Past Surgical History:  Procedure Laterality Date  . AV  FISTULA PLACEMENT Left 08/26/2015   Procedure: LEFT RADIOCEPHALIC FISTULA CREATION;  Surgeon: Rosetta Posner, MD;  Location: [place];  Service: Vascular;  Laterality: Left;  . AV FISTULA PLACEMENT Left 11/23/2015   Procedure: ARTERIOVENOUS (AV) FISTULA CREATION;  Surgeon: Rosetta Posner, MD;  Location: [place];  Service: Vascular;  Laterality: Left;  . BACK SURGERY     x2- discectomy  . CHOLECYSTECTOMY  1994  .  CYSTOSCOPY/RETROGRADE/URETEROSCOPY/STONE EXTRACTION WITH BASKET    . ESOPHAGOGASTRODUODENOSCOPY N/A 11/13/2015   Procedure: ESOPHAGOGASTRODUODENOSCOPY (EGD);  Surgeon: Irene Shipper, MD;  Location: [facility] ENDOSCOPY;  Service: Endoscopy;  Laterality: N/A;  . INSERTION OF DIALYSIS CATHETER Left 11/23/2015   Procedure: INSERTION OF DIALYSIS CATHETER;  Surgeon: Rosetta Posner, MD;  Location: [place];  Service: Vascular;  Laterality: Left;  . IR GENERIC HISTORICAL Left 08/04/2016   IR DIALY SHUNT INTRO NEEDLE/INTRACATH INITIAL W/IMG LEFT 08/04/2016 Markus Daft, MD [facility]  . IR GENERIC HISTORICAL  08/04/2016   IR US GUIDE VASC ACCESS LEFT 08/04/2016 Markus Daft, MD [facility]  . JOINT REPLACEMENT     Total L-Hip replacement, Right Knee 10/20/09  . LEFT AND RIGHT HEART CATHETERIZATION WITH CORONARY ANGIOGRAM N/A 02/22/2013   Procedure: LEFT AND RIGHT HEART CATHETERIZATION WITH CORONARY ANGIOGRAM;  Surgeon: Jolaine Artist, MD;  Location: [facility] CATH LAB;  Service: Cardiovascular;  Laterality: N/A;  . LITHOTRIPSY  90's  . PERIPHERAL VASCULAR CATHETERIZATION Left 11/19/2015   Procedure: A/V/Fistulagram;  Surgeon: Conrad [place], MD;  Location: [place] CV LAB;  Service: Cardiovascular;  Laterality: Left;  . PERIPHERAL VASCULAR CATHETERIZATION Left 07/21/2016   Procedure: A/V Fistulagram;  Surgeon: Conrad [place], MD;  Location: [place] CV LAB;  Service: Cardiovascular;  Laterality: Left;  arm  . REVISON OF ARTERIOVENOUS FISTULA Left 05/30/2016   Procedure: REVISION LEFT UPPER ARM FISTULA;  Surgeon: Conrad [place], MD;  Location: [place];  Service: Vascular;  Laterality: Left;  . REVISON OF ARTERIOVENOUS FISTULA Left 07/22/2016   Procedure: REVISON OF BASILIC VEIN TRANSPOSITION ANASTOMOSIS;  Surgeon: Rosetta Posner, MD;  Location: [place];  Service: Vascular;  Laterality: Left;  . SPINE SURGERY     x 2  . TOTAL HIP ARTHROPLASTY Right 03/22/2016   Procedure: RIGHT TOTAL HIP ARTHROPLASTY ANTERIOR APPROACH;  Surgeon: Mcarthur Rossetti, MD;  Location: [place];  Service: Orthopedics;  Laterality: Right;   Social History   Social History Narrative   Former Clinical biochemist and [place]   Admitted to [facility] 11/25/15   Widowed   Never smoked   FULL CODE     Objective: Vital Signs: There were no vitals taken for this visit.   Physical Exam   Musculoskeletal Exam: ***  CDAI Exam: No CDAI exam completed.    Investigation: No additional findings.PLQ eye exam: 03/06/2017 CBC Latest Ref Rng & Units 10/27/2016 10/27/2016 08/05/2016  WBC 4.0 - 10.5 K/uL - 8.5 6.7  Hemoglobin 13.0 - 17.0 g/dL 11.6(L) 11.5(L) 11.2(L)  Hematocrit 39.0 - 52.0 % 34.0(L) 34.6(L) 35.1(L)  Platelets 150 - 400 K/uL - 187 206   CMP Latest Ref Rng & Units 10/27/2016 10/27/2016 08/05/2016  Glucose 65 - 99 mg/dL 91 88 105(H)  BUN 6 - 20 mg/dL 31(H) 28(H) 42(H)  Creatinine 0.61 - 1.24 mg/dL 5.80(H) 5.56(H) 9.52(H)  Sodium 135 - 145 mmol/L 137 138 135  Potassium 3.5 - 5.1 mmol/L 4.3 4.2 4.8  Chloride 101 - 111 mmol/L 100(L) 97(L) 95(L)  CO2 22 -  32 mmol/L - 28 27  Calcium 8.9 - 10.3 mg/dL - 9.5 9.3  Total Protein 6.5 - 8.1 g/dL - 8.5(H) -  Total Bilirubin 0.3 - 1.2 mg/dL - 0.4 -  Alkaline Phos 38 - 126 U/L - 119 -  AST 15 - 41 U/L - 27 -  ALT 17 - 63 U/L - 15(L) -    Imaging: No results found.  Speciality Comments: No specialty comments available.    Procedures:  No procedures performed Allergies: Ace inhibitors   Assessment / Plan:     Visit Diagnoses: No diagnosis found.    Orders: No orders of the defined types were placed in this encounter.  No orders of the defined types were placed in this encounter.   Face-to-face time spent with patient was *** minutes. 50% of time was spent in counseling and coordination of care.  Follow-Up Instructions: No Follow-up on file.   Earnestine Mealing, CMA  Note - This record has been created using Editor, commissioning.  Chart creation errors have been sought, but may not  always  have been located. Such creation errors do not reflect on  the standard of medical care.

## 2017-06-20 DIAGNOSIS — I771 Stricture of artery: Secondary | ICD-10-CM | POA: Diagnosis not present

## 2017-06-20 DIAGNOSIS — N186 End stage renal disease: Secondary | ICD-10-CM | POA: Diagnosis not present

## 2017-06-20 DIAGNOSIS — T82858A Stenosis of vascular prosthetic devices, implants and grafts, initial encounter: Secondary | ICD-10-CM | POA: Diagnosis not present

## 2017-06-20 DIAGNOSIS — Z992 Dependence on renal dialysis: Secondary | ICD-10-CM | POA: Diagnosis not present

## 2017-06-21 ENCOUNTER — Ambulatory Visit: Payer: Medicare Other | Admitting: Family

## 2017-06-21 DIAGNOSIS — D509 Iron deficiency anemia, unspecified: Secondary | ICD-10-CM | POA: Diagnosis not present

## 2017-06-21 DIAGNOSIS — D631 Anemia in chronic kidney disease: Secondary | ICD-10-CM | POA: Diagnosis not present

## 2017-06-21 DIAGNOSIS — I4891 Unspecified atrial fibrillation: Secondary | ICD-10-CM | POA: Diagnosis not present

## 2017-06-21 DIAGNOSIS — E8770 Fluid overload, unspecified: Secondary | ICD-10-CM | POA: Diagnosis not present

## 2017-06-21 DIAGNOSIS — N186 End stage renal disease: Secondary | ICD-10-CM | POA: Diagnosis not present

## 2017-06-21 DIAGNOSIS — N2581 Secondary hyperparathyroidism of renal origin: Secondary | ICD-10-CM | POA: Diagnosis not present

## 2017-06-22 ENCOUNTER — Other Ambulatory Visit: Payer: Self-pay

## 2017-06-23 DIAGNOSIS — N2581 Secondary hyperparathyroidism of renal origin: Secondary | ICD-10-CM | POA: Diagnosis not present

## 2017-06-23 DIAGNOSIS — D631 Anemia in chronic kidney disease: Secondary | ICD-10-CM | POA: Diagnosis not present

## 2017-06-23 DIAGNOSIS — I4891 Unspecified atrial fibrillation: Secondary | ICD-10-CM | POA: Diagnosis not present

## 2017-06-23 DIAGNOSIS — E8770 Fluid overload, unspecified: Secondary | ICD-10-CM | POA: Diagnosis not present

## 2017-06-23 DIAGNOSIS — D509 Iron deficiency anemia, unspecified: Secondary | ICD-10-CM | POA: Diagnosis not present

## 2017-06-23 DIAGNOSIS — N186 End stage renal disease: Secondary | ICD-10-CM | POA: Diagnosis not present

## 2017-06-24 DIAGNOSIS — D509 Iron deficiency anemia, unspecified: Secondary | ICD-10-CM | POA: Diagnosis not present

## 2017-06-24 DIAGNOSIS — D631 Anemia in chronic kidney disease: Secondary | ICD-10-CM | POA: Diagnosis not present

## 2017-06-24 DIAGNOSIS — E8770 Fluid overload, unspecified: Secondary | ICD-10-CM | POA: Diagnosis not present

## 2017-06-24 DIAGNOSIS — N2581 Secondary hyperparathyroidism of renal origin: Secondary | ICD-10-CM | POA: Diagnosis not present

## 2017-06-24 DIAGNOSIS — I4891 Unspecified atrial fibrillation: Secondary | ICD-10-CM | POA: Diagnosis not present

## 2017-06-24 DIAGNOSIS — N186 End stage renal disease: Secondary | ICD-10-CM | POA: Diagnosis not present

## 2017-06-27 ENCOUNTER — Ambulatory Visit: Payer: Medicare Other | Admitting: Family

## 2017-06-27 DIAGNOSIS — D509 Iron deficiency anemia, unspecified: Secondary | ICD-10-CM | POA: Diagnosis not present

## 2017-06-27 DIAGNOSIS — I4891 Unspecified atrial fibrillation: Secondary | ICD-10-CM | POA: Diagnosis not present

## 2017-06-27 DIAGNOSIS — N186 End stage renal disease: Secondary | ICD-10-CM | POA: Diagnosis not present

## 2017-06-27 DIAGNOSIS — D631 Anemia in chronic kidney disease: Secondary | ICD-10-CM | POA: Diagnosis not present

## 2017-06-27 DIAGNOSIS — N2581 Secondary hyperparathyroidism of renal origin: Secondary | ICD-10-CM | POA: Diagnosis not present

## 2017-06-27 DIAGNOSIS — E8770 Fluid overload, unspecified: Secondary | ICD-10-CM | POA: Diagnosis not present

## 2017-06-28 DIAGNOSIS — D631 Anemia in chronic kidney disease: Secondary | ICD-10-CM | POA: Diagnosis not present

## 2017-06-28 DIAGNOSIS — D509 Iron deficiency anemia, unspecified: Secondary | ICD-10-CM | POA: Diagnosis not present

## 2017-06-28 DIAGNOSIS — E8770 Fluid overload, unspecified: Secondary | ICD-10-CM | POA: Diagnosis not present

## 2017-06-28 DIAGNOSIS — I4891 Unspecified atrial fibrillation: Secondary | ICD-10-CM | POA: Diagnosis not present

## 2017-06-28 DIAGNOSIS — N2581 Secondary hyperparathyroidism of renal origin: Secondary | ICD-10-CM | POA: Diagnosis not present

## 2017-06-28 DIAGNOSIS — N186 End stage renal disease: Secondary | ICD-10-CM | POA: Diagnosis not present

## 2017-06-30 ENCOUNTER — Ambulatory Visit: Payer: Medicare Other | Admitting: Rheumatology

## 2017-06-30 DIAGNOSIS — N2581 Secondary hyperparathyroidism of renal origin: Secondary | ICD-10-CM | POA: Diagnosis not present

## 2017-06-30 DIAGNOSIS — D509 Iron deficiency anemia, unspecified: Secondary | ICD-10-CM | POA: Diagnosis not present

## 2017-06-30 DIAGNOSIS — D631 Anemia in chronic kidney disease: Secondary | ICD-10-CM | POA: Diagnosis not present

## 2017-06-30 DIAGNOSIS — E8770 Fluid overload, unspecified: Secondary | ICD-10-CM | POA: Diagnosis not present

## 2017-06-30 DIAGNOSIS — I4891 Unspecified atrial fibrillation: Secondary | ICD-10-CM | POA: Diagnosis not present

## 2017-06-30 DIAGNOSIS — N186 End stage renal disease: Secondary | ICD-10-CM | POA: Diagnosis not present

## 2017-07-03 DIAGNOSIS — I4891 Unspecified atrial fibrillation: Secondary | ICD-10-CM | POA: Diagnosis not present

## 2017-07-03 DIAGNOSIS — D509 Iron deficiency anemia, unspecified: Secondary | ICD-10-CM | POA: Diagnosis not present

## 2017-07-03 DIAGNOSIS — D631 Anemia in chronic kidney disease: Secondary | ICD-10-CM | POA: Diagnosis not present

## 2017-07-03 DIAGNOSIS — N186 End stage renal disease: Secondary | ICD-10-CM | POA: Diagnosis not present

## 2017-07-03 DIAGNOSIS — N2581 Secondary hyperparathyroidism of renal origin: Secondary | ICD-10-CM | POA: Diagnosis not present

## 2017-07-03 DIAGNOSIS — E8770 Fluid overload, unspecified: Secondary | ICD-10-CM | POA: Diagnosis not present

## 2017-07-04 ENCOUNTER — Ambulatory Visit (INDEPENDENT_AMBULATORY_CARE_PROVIDER_SITE_OTHER): Payer: Medicare Other | Admitting: Family

## 2017-07-04 ENCOUNTER — Telehealth: Payer: Self-pay | Admitting: Family

## 2017-07-04 ENCOUNTER — Encounter: Payer: Self-pay | Admitting: Family

## 2017-07-04 VITALS — BP 112/52 | HR 59 | Temp 98.4°F | Resp 18 | Ht 74.0 in | Wt 273.0 lb

## 2017-07-04 DIAGNOSIS — I482 Chronic atrial fibrillation, unspecified: Secondary | ICD-10-CM

## 2017-07-04 DIAGNOSIS — E1122 Type 2 diabetes mellitus with diabetic chronic kidney disease: Secondary | ICD-10-CM

## 2017-07-04 DIAGNOSIS — N186 End stage renal disease: Secondary | ICD-10-CM | POA: Diagnosis not present

## 2017-07-04 DIAGNOSIS — I5042 Chronic combined systolic (congestive) and diastolic (congestive) heart failure: Secondary | ICD-10-CM

## 2017-07-04 DIAGNOSIS — Z992 Dependence on renal dialysis: Secondary | ICD-10-CM

## 2017-07-04 DIAGNOSIS — I10 Essential (primary) hypertension: Secondary | ICD-10-CM | POA: Diagnosis not present

## 2017-07-04 NOTE — Telephone Encounter (Signed)
Contacted pt ans set up a follow up for 3 months

## 2017-07-04 NOTE — Assessment & Plan Note (Signed)
Clinically stable, continue diabetic diet.

## 2017-07-04 NOTE — Assessment & Plan Note (Signed)
Clinically euvolemic today.

## 2017-07-04 NOTE — Assessment & Plan Note (Signed)
Rate stable- continues coumadin which is being adjusted by nephrology.

## 2017-07-04 NOTE — Assessment & Plan Note (Signed)
Stable off of meds.  Monitor.  

## 2017-07-04 NOTE — Progress Notes (Signed)
Subjective:    Patient ID: Bruce Cole, male    DOB: 19-Jun-1941, 76 y.o.   MRN: [id number]  HPI  [name] is a 76 yr old male who presents today for follow up.  1) DM2- maintained on dm diet alone. Feels like his diet has only been fair.   Lab Results  Component Value Date   HGBA1C 5.6 02/20/2017   HGBA1C 6.3 11/14/2016   HGBA1C 6.1 (H) 08/02/2016   Lab Results  Component Value Date   MICROALBUR 20.7 (H) 12/16/2014   LDLCALC 86 11/14/2016   CREATININE 5.80 (H) 10/27/2016   2) HTN- not currently on antihypertensive.  BP Readings from Last 3 Encounters:  07/04/17 (!) 112/52  02/20/17 (!) 121/49  01/23/17 130/74   3) Hyperlipidemia- maintained on lipitor Lab Results  Component Value Date   CHOL 178 11/14/2016   HDL 73.20 11/14/2016   LDLCALC 86 11/14/2016   TRIG 95.0 11/14/2016   CHOLHDL 2 11/14/2016   4) ESRD-  Continues M/W/F dialysis.     Review of Systems    see HPI  Past Medical History:  Diagnosis Date  . Acute blood loss anemia   . Atrial fibrillation (HCC)    Chronic  . Chronic combined systolic and diastolic heart failure ([place])   . Colon polyp 2000  . Dysrhythmia    hx  . ESRD on hemodialysis ([place])    [place]  . Essential hypertension   . Gastritis and gastroduodenitis   . GI bleed   . Gout   . Heart murmur   . History of blood transfusion   . History of kidney stones   . Nephrolithiasis   . OSA (obstructive sleep apnea) 09/02/2013    IMPRESSION :  1. Mild obstructive sleep apnea with hypopneas causing sleep fragmentation and moderate oxygen desaturation.  2. Short runs of nonsustained VT were noted. His beta blocker may need to be titrated 3. Significant PLM's were noted, the PLM arousal index was low. Please correlate with clinical history of restless leg syndrome.  4. Sleep efficiency was poor.  RECOMMENDATION:  1. Treatment options for this degree of sleep disordered breathing include weight loss and positional  therapy to avoid supine sleep 2. Consider titrating beta blocker further, defer to cardiologist 3. Patient should be cautioned against driving when sleepy.They should be asked to avoid medications with sedative side effects     . Osteoarthritis of right hip   . Pneumonia    hx 30 yrs ago  . Primary osteoarthritis of right hip   . Rheumatoid arthritis ([place])   . Thrombocytopenia ([place])      Social History   Socioeconomic History  . Marital status: Single    Spouse name: Not on file  . Number of children: 3  . Years of education: Not on file  . Highest education level: Not on file  Social Needs  . Financial resource strain: Not on file  . Food insecurity - worry: Not on file  . Food insecurity - inability: Not on file  . Transportation needs - medical: Not on file  . Transportation needs - non-medical: Not on file  Occupational History  . Occupation: retired Personnel officer: RETIRED  Tobacco Use  . Smoking status: Never Smoker  . Smokeless tobacco: Never Used  Substance and Sexual Activity  . Alcohol use: No    Comment: occasional  . Drug use: No  . Sexual activity: Not on file  Other Topics Concern  . Not on file  Social History Narrative   Former [place] [employer] [place]   Admitted to [facility] 11/25/15   Widowed   Never smoked   FULL CODE    Past Surgical History:  Procedure Laterality Date  . AV FISTULA PLACEMENT Left 08/26/2015   Procedure: LEFT RADIOCEPHALIC FISTULA CREATION;  Surgeon: Rosetta Posner, MD;  Location: [facility];  Service: Vascular;  Laterality: Left;  . AV FISTULA PLACEMENT Left 11/23/2015   Procedure: ARTERIOVENOUS (AV) FISTULA CREATION;  Surgeon: Rosetta Posner, MD;  Location: [facility];  Service: Vascular;  Laterality: Left;  . BACK SURGERY     x2- discectomy  . CHOLECYSTECTOMY  1994  . CYSTOSCOPY/RETROGRADE/URETEROSCOPY/STONE EXTRACTION WITH BASKET    . ESOPHAGOGASTRODUODENOSCOPY N/A 11/13/2015   Procedure:  ESOPHAGOGASTRODUODENOSCOPY (EGD);  Surgeon: Irene Shipper, MD;  Location: [facility] ENDOSCOPY;  Service: Endoscopy;  Laterality: N/A;  . INSERTION OF DIALYSIS CATHETER Left 11/23/2015   Procedure: INSERTION OF DIALYSIS CATHETER;  Surgeon: Rosetta Posner, MD;  Location: [facility];  Service: Vascular;  Laterality: Left;  . IR GENERIC HISTORICAL Left 08/04/2016   IR DIALY SHUNT INTRO NEEDLE/INTRACATH INITIAL W/IMG LEFT 08/04/2016 Markus Daft, MD [facility]INTER[facility] RAD  . IR GENERIC HISTORICAL  08/04/2016   IR US GUIDE VASC ACCESS LEFT 08/04/2016 Markus Daft, MD [facility]  . JOINT REPLACEMENT     Total L-Hip replacement, Right Knee 10/20/09  . LEFT AND RIGHT HEART CATHETERIZATION WITH CORONARY ANGIOGRAM N/A 02/22/2013   Procedure: LEFT AND RIGHT HEART CATHETERIZATION WITH CORONARY ANGIOGRAM;  Surgeon: Jolaine Artist, MD;  Location: [facility] CATH LAB;  Service: Cardiovascular;  Laterality: N/A;  . LITHOTRIPSY  90's  . PERIPHERAL VASCULAR CATHETERIZATION Left 11/19/2015   Procedure: A/V/Fistulagram;  Surgeon: Conrad Oatfield, MD;  Location: [facility] CV LAB;  Service: Cardiovascular;  Laterality: Left;  . PERIPHERAL VASCULAR CATHETERIZATION Left 07/21/2016   Procedure: A/V Fistulagram;  Surgeon: Conrad Slaughterville, MD;  Location: [facility] CV LAB;  Service: Cardiovascular;  Laterality: Left;  arm  . REVISON OF ARTERIOVENOUS FISTULA Left 05/30/2016   Procedure: REVISION LEFT UPPER ARM FISTULA;  Surgeon: Conrad Grandwood Park, MD;  Location: [facility];  Service: Vascular;  Laterality: Left;  . REVISON OF ARTERIOVENOUS FISTULA Left 07/22/2016   Procedure: REVISON OF BASILIC VEIN TRANSPOSITION ANASTOMOSIS;  Surgeon: Rosetta Posner, MD;  Location: [facility];  Service: Vascular;  Laterality: Left;  . SPINE SURGERY     x 2  . TOTAL HIP ARTHROPLASTY Right 03/22/2016   Procedure: RIGHT TOTAL HIP ARTHROPLASTY ANTERIOR APPROACH;  Surgeon: Mcarthur Rossetti, MD;  Location: [facility];  Service: Orthopedics;  Laterality: Right;    Family History  Problem Relation Age of  Onset  . Hypertension Mother   . Arthritis Mother        ?RA  . Hypertension Father     Allergies  Allergen Reactions  . Ace Inhibitors Other (See Comments)    Worsening renal insufficiency    Current Outpatient Medications on File Prior to Visit  Medication Sig Dispense Refill  . acetaminophen (TYLENOL) 500 MG tablet Take 500 mg by mouth every 6 (six) hours as needed for mild pain.    [name] albuterol (PROVENTIL) (2.5 MG/3ML) 0.083% nebulizer solution Take 2.5 mg by nebulization every 4 (four) hours as needed for shortness of breath. Reported on 09/25/2015    . atorvastatin (LIPITOR) 10 MG tablet Take 1 tablet (10 mg total) by mouth daily. [place]  tablet 1  . B Complex-C-Folic Acid (RENA-VITE RX) 1 MG TABS Take 1 tablet by mouth at bedtime.   6  . colchicine 0.6 MG tablet Take 1/2 tablet by mouth once as needed for gout flare. Call if no improvement 15 tablet 0  . fluticasone (FLONASE) 50 MCG/ACT nasal spray INSTILL 2 SPRAYS INTO BOTH NOSTRILS EVERY DAY 16 g 3  . furosemide (LASIX) 40 MG tablet Take 1 tablet (40 mg total) by mouth 2 (two) times daily. 180 tablet 1  . gabapentin (NEURONTIN) 300 MG capsule TAKE 1 CAPSULE(300 MG) BY MOUTH AT BEDTIME 90 capsule 1  . hydroxychloroquine (PLAQUENIL) 200 MG tablet TAKE 1 TABLET(200 MG) BY MOUTH DAILY 90 tablet 1  . RENVELA 800 MG tablet Take 3 tablets (2,400 mg total) by mouth 3 (three) times daily with meals. 90 tablet 0  . warfarin (COUMADIN) 7.5 MG tablet TAKE 1 TABLET BY MOUTH DAILY AS DIRECTED BY COUMADIN CLINIC 100 tablet 1   No current facility-administered medications on file prior to visit.     BP (!) 112/52 (BP Location: Right Arm, Cuff Size: Large)   Pulse (!) 59   Temp 98.4 F (36.9 C) (Oral)   Resp 18   Ht 6\' 2"  (1.88 m)   Wt 273 lb (123.8 kg)   SpO2 100%   BMI 35.05 kg/m    Objective:   Physical Exam  Constitutional: He is oriented to person, place, and time. He appears well-developed and well-nourished. No distress.  HENT:    Head: Normocephalic and atraumatic.  Cardiovascular: Normal rate. An irregular rhythm present.  Murmur heard. Pulmonary/Chest: Effort normal and breath sounds normal. No respiratory distress. He has no wheezes. He has no rales.  Musculoskeletal: He exhibits no edema.  Neurological: He is alert and oriented to person, place, and time.  Skin: Skin is warm and dry.  Psychiatric: He has a normal mood and affect. His behavior is normal. Thought content normal.          Assessment & Plan:

## 2017-07-04 NOTE — Assessment & Plan Note (Signed)
Clinically euvolemic- management per nephrology.

## 2017-07-04 NOTE — Telephone Encounter (Signed)
Could you please contact pt and let him know that I thought more about his follow up and I would like to see him back in 3 rather than 6 months so we can check his A1C.

## 2017-07-05 DIAGNOSIS — D631 Anemia in chronic kidney disease: Secondary | ICD-10-CM | POA: Diagnosis not present

## 2017-07-05 DIAGNOSIS — N186 End stage renal disease: Secondary | ICD-10-CM | POA: Diagnosis not present

## 2017-07-05 DIAGNOSIS — N2581 Secondary hyperparathyroidism of renal origin: Secondary | ICD-10-CM | POA: Diagnosis not present

## 2017-07-05 DIAGNOSIS — I4891 Unspecified atrial fibrillation: Secondary | ICD-10-CM | POA: Diagnosis not present

## 2017-07-05 DIAGNOSIS — D509 Iron deficiency anemia, unspecified: Secondary | ICD-10-CM | POA: Diagnosis not present

## 2017-07-05 DIAGNOSIS — E8770 Fluid overload, unspecified: Secondary | ICD-10-CM | POA: Diagnosis not present

## 2017-07-07 DIAGNOSIS — D631 Anemia in chronic kidney disease: Secondary | ICD-10-CM | POA: Diagnosis not present

## 2017-07-07 DIAGNOSIS — N2581 Secondary hyperparathyroidism of renal origin: Secondary | ICD-10-CM | POA: Diagnosis not present

## 2017-07-07 DIAGNOSIS — I4891 Unspecified atrial fibrillation: Secondary | ICD-10-CM | POA: Diagnosis not present

## 2017-07-07 DIAGNOSIS — D509 Iron deficiency anemia, unspecified: Secondary | ICD-10-CM | POA: Diagnosis not present

## 2017-07-07 DIAGNOSIS — E8770 Fluid overload, unspecified: Secondary | ICD-10-CM | POA: Diagnosis not present

## 2017-07-07 DIAGNOSIS — N186 End stage renal disease: Secondary | ICD-10-CM | POA: Diagnosis not present

## 2017-07-09 DIAGNOSIS — N186 End stage renal disease: Secondary | ICD-10-CM | POA: Diagnosis not present

## 2017-07-09 DIAGNOSIS — N2581 Secondary hyperparathyroidism of renal origin: Secondary | ICD-10-CM | POA: Diagnosis not present

## 2017-07-09 DIAGNOSIS — D509 Iron deficiency anemia, unspecified: Secondary | ICD-10-CM | POA: Diagnosis not present

## 2017-07-09 DIAGNOSIS — D631 Anemia in chronic kidney disease: Secondary | ICD-10-CM | POA: Diagnosis not present

## 2017-07-09 DIAGNOSIS — I4891 Unspecified atrial fibrillation: Secondary | ICD-10-CM | POA: Diagnosis not present

## 2017-07-09 DIAGNOSIS — E8770 Fluid overload, unspecified: Secondary | ICD-10-CM | POA: Diagnosis not present

## 2017-07-12 DIAGNOSIS — N2581 Secondary hyperparathyroidism of renal origin: Secondary | ICD-10-CM | POA: Diagnosis not present

## 2017-07-12 DIAGNOSIS — N186 End stage renal disease: Secondary | ICD-10-CM | POA: Diagnosis not present

## 2017-07-12 DIAGNOSIS — I4891 Unspecified atrial fibrillation: Secondary | ICD-10-CM | POA: Diagnosis not present

## 2017-07-12 DIAGNOSIS — E8770 Fluid overload, unspecified: Secondary | ICD-10-CM | POA: Diagnosis not present

## 2017-07-12 DIAGNOSIS — D631 Anemia in chronic kidney disease: Secondary | ICD-10-CM | POA: Diagnosis not present

## 2017-07-12 DIAGNOSIS — D509 Iron deficiency anemia, unspecified: Secondary | ICD-10-CM | POA: Diagnosis not present

## 2017-07-12 LAB — POCT INR: INR: 20.9

## 2017-07-14 ENCOUNTER — Telehealth: Payer: Self-pay | Admitting: *Deleted

## 2017-07-14 DIAGNOSIS — D631 Anemia in chronic kidney disease: Secondary | ICD-10-CM | POA: Diagnosis not present

## 2017-07-14 DIAGNOSIS — I4891 Unspecified atrial fibrillation: Secondary | ICD-10-CM | POA: Diagnosis not present

## 2017-07-14 DIAGNOSIS — N2581 Secondary hyperparathyroidism of renal origin: Secondary | ICD-10-CM | POA: Diagnosis not present

## 2017-07-14 DIAGNOSIS — N186 End stage renal disease: Secondary | ICD-10-CM | POA: Diagnosis not present

## 2017-07-14 DIAGNOSIS — D509 Iron deficiency anemia, unspecified: Secondary | ICD-10-CM | POA: Diagnosis not present

## 2017-07-14 DIAGNOSIS — E8770 Fluid overload, unspecified: Secondary | ICD-10-CM | POA: Diagnosis not present

## 2017-07-14 NOTE — Telephone Encounter (Signed)
Received PT/INR results from [facility] via [facility]; forwarded to provider/SLS 12/28

## 2017-07-16 DIAGNOSIS — D509 Iron deficiency anemia, unspecified: Secondary | ICD-10-CM | POA: Diagnosis not present

## 2017-07-16 DIAGNOSIS — E8770 Fluid overload, unspecified: Secondary | ICD-10-CM | POA: Diagnosis not present

## 2017-07-16 DIAGNOSIS — I4891 Unspecified atrial fibrillation: Secondary | ICD-10-CM | POA: Diagnosis not present

## 2017-07-16 DIAGNOSIS — D631 Anemia in chronic kidney disease: Secondary | ICD-10-CM | POA: Diagnosis not present

## 2017-07-16 DIAGNOSIS — N2581 Secondary hyperparathyroidism of renal origin: Secondary | ICD-10-CM | POA: Diagnosis not present

## 2017-07-16 DIAGNOSIS — N186 End stage renal disease: Secondary | ICD-10-CM | POA: Diagnosis not present

## 2017-07-17 ENCOUNTER — Telehealth: Payer: Self-pay | Admitting: Family

## 2017-07-17 DIAGNOSIS — Z992 Dependence on renal dialysis: Secondary | ICD-10-CM | POA: Diagnosis not present

## 2017-07-17 DIAGNOSIS — I158 Other secondary hypertension: Secondary | ICD-10-CM | POA: Diagnosis not present

## 2017-07-17 DIAGNOSIS — N186 End stage renal disease: Secondary | ICD-10-CM | POA: Diagnosis not present

## 2017-07-17 NOTE — Telephone Encounter (Signed)
Please contact patient and let him know that his INR from 12/26 was low. Just wanted to confirm that Kidney doctors adjusted his coumadin.

## 2017-07-17 NOTE — Telephone Encounter (Signed)
Notified pt. He states dose has not been changed by kidney specialist. Pt currently taking 1 tablet (7.5mg ) everyday except Monday and Friday he takes 1/2 tablet. Per verbal from PCP, pt should take 1 tablet every day except Friday, take 1/2 tablet then return in 10 days for INR check in our office. Nurse visit scheduled for 07/28/16 at 3pm.

## 2017-07-19 ENCOUNTER — Other Ambulatory Visit: Payer: Self-pay | Admitting: Family

## 2017-07-19 DIAGNOSIS — I4891 Unspecified atrial fibrillation: Secondary | ICD-10-CM | POA: Diagnosis not present

## 2017-07-19 DIAGNOSIS — D509 Iron deficiency anemia, unspecified: Secondary | ICD-10-CM | POA: Diagnosis not present

## 2017-07-19 DIAGNOSIS — N2581 Secondary hyperparathyroidism of renal origin: Secondary | ICD-10-CM | POA: Diagnosis not present

## 2017-07-19 DIAGNOSIS — D631 Anemia in chronic kidney disease: Secondary | ICD-10-CM | POA: Diagnosis not present

## 2017-07-19 DIAGNOSIS — N186 End stage renal disease: Secondary | ICD-10-CM | POA: Diagnosis not present

## 2017-07-21 DIAGNOSIS — D509 Iron deficiency anemia, unspecified: Secondary | ICD-10-CM | POA: Diagnosis not present

## 2017-07-21 DIAGNOSIS — I4891 Unspecified atrial fibrillation: Secondary | ICD-10-CM | POA: Diagnosis not present

## 2017-07-21 DIAGNOSIS — N2581 Secondary hyperparathyroidism of renal origin: Secondary | ICD-10-CM | POA: Diagnosis not present

## 2017-07-21 DIAGNOSIS — D631 Anemia in chronic kidney disease: Secondary | ICD-10-CM | POA: Diagnosis not present

## 2017-07-21 DIAGNOSIS — N186 End stage renal disease: Secondary | ICD-10-CM | POA: Diagnosis not present

## 2017-07-24 DIAGNOSIS — N186 End stage renal disease: Secondary | ICD-10-CM | POA: Diagnosis not present

## 2017-07-24 DIAGNOSIS — D631 Anemia in chronic kidney disease: Secondary | ICD-10-CM | POA: Diagnosis not present

## 2017-07-24 DIAGNOSIS — I4891 Unspecified atrial fibrillation: Secondary | ICD-10-CM | POA: Diagnosis not present

## 2017-07-24 DIAGNOSIS — N2581 Secondary hyperparathyroidism of renal origin: Secondary | ICD-10-CM | POA: Diagnosis not present

## 2017-07-24 DIAGNOSIS — D509 Iron deficiency anemia, unspecified: Secondary | ICD-10-CM | POA: Diagnosis not present

## 2017-07-26 DIAGNOSIS — D509 Iron deficiency anemia, unspecified: Secondary | ICD-10-CM | POA: Diagnosis not present

## 2017-07-26 DIAGNOSIS — N2581 Secondary hyperparathyroidism of renal origin: Secondary | ICD-10-CM | POA: Diagnosis not present

## 2017-07-26 DIAGNOSIS — I4891 Unspecified atrial fibrillation: Secondary | ICD-10-CM | POA: Diagnosis not present

## 2017-07-26 DIAGNOSIS — N186 End stage renal disease: Secondary | ICD-10-CM | POA: Diagnosis not present

## 2017-07-26 DIAGNOSIS — D631 Anemia in chronic kidney disease: Secondary | ICD-10-CM | POA: Diagnosis not present

## 2017-07-28 ENCOUNTER — Ambulatory Visit: Payer: Medicare Other

## 2017-07-28 DIAGNOSIS — N186 End stage renal disease: Secondary | ICD-10-CM | POA: Diagnosis not present

## 2017-07-28 DIAGNOSIS — D509 Iron deficiency anemia, unspecified: Secondary | ICD-10-CM | POA: Diagnosis not present

## 2017-07-28 DIAGNOSIS — N2581 Secondary hyperparathyroidism of renal origin: Secondary | ICD-10-CM | POA: Diagnosis not present

## 2017-07-28 DIAGNOSIS — D631 Anemia in chronic kidney disease: Secondary | ICD-10-CM | POA: Diagnosis not present

## 2017-07-28 DIAGNOSIS — I4891 Unspecified atrial fibrillation: Secondary | ICD-10-CM | POA: Diagnosis not present

## 2017-07-31 DIAGNOSIS — D631 Anemia in chronic kidney disease: Secondary | ICD-10-CM | POA: Diagnosis not present

## 2017-07-31 DIAGNOSIS — I4891 Unspecified atrial fibrillation: Secondary | ICD-10-CM | POA: Diagnosis not present

## 2017-07-31 DIAGNOSIS — N2581 Secondary hyperparathyroidism of renal origin: Secondary | ICD-10-CM | POA: Diagnosis not present

## 2017-07-31 DIAGNOSIS — D509 Iron deficiency anemia, unspecified: Secondary | ICD-10-CM | POA: Diagnosis not present

## 2017-07-31 DIAGNOSIS — N186 End stage renal disease: Secondary | ICD-10-CM | POA: Diagnosis not present

## 2017-08-01 ENCOUNTER — Telehealth: Payer: Self-pay | Admitting: *Deleted

## 2017-08-01 IMAGING — DX DG CHEST 2V
2 series · 2 of 2 positions shown · non-contrast
Comparison: PA and lateral chest 01/29/2013. Single view of the
chest 12/10/2014.

CLINICAL DATA: Shortness of breath. Lower extremity swelling.
Symptoms for 1 week.

EXAM:
CHEST  2 VIEW

[chest pa]
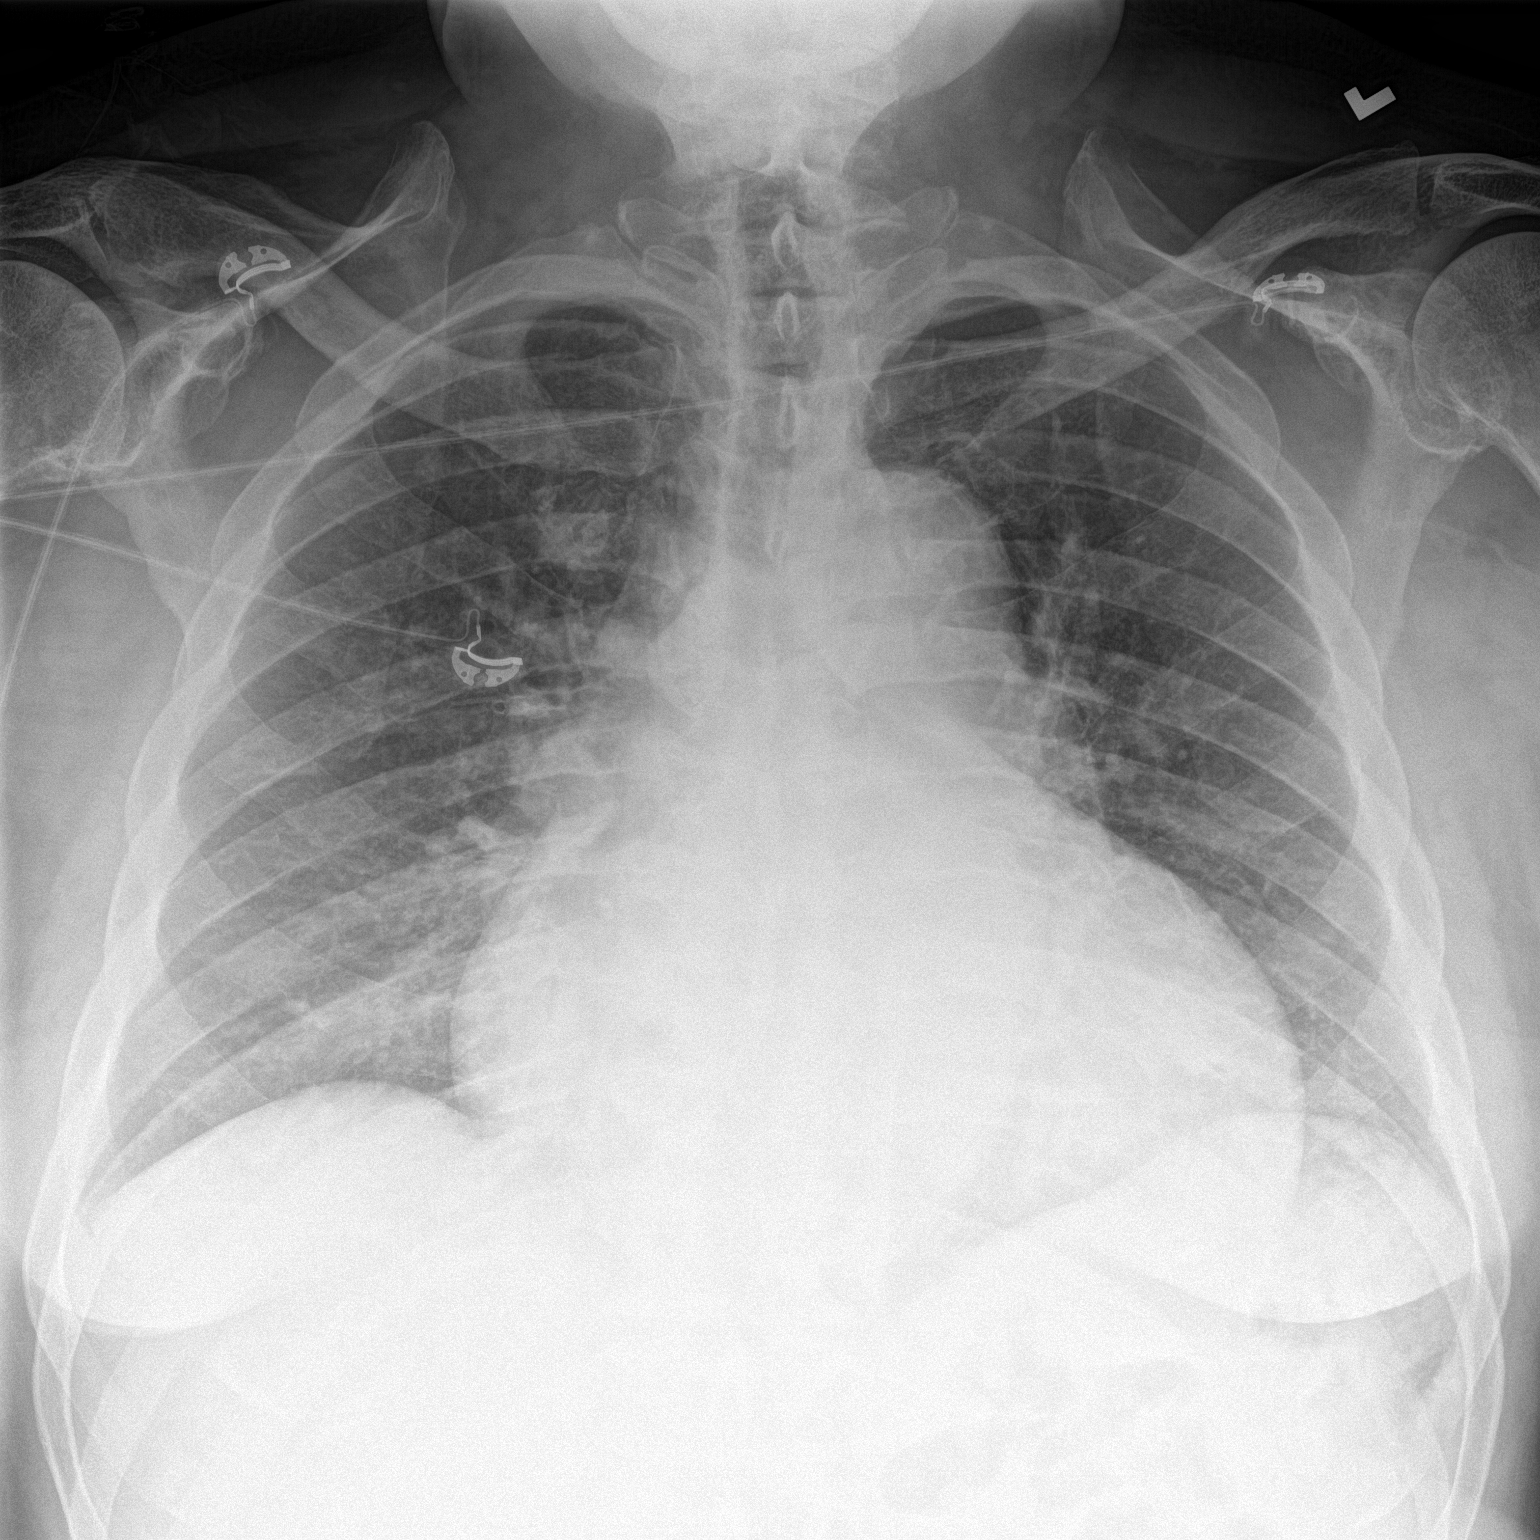

[chest lat]
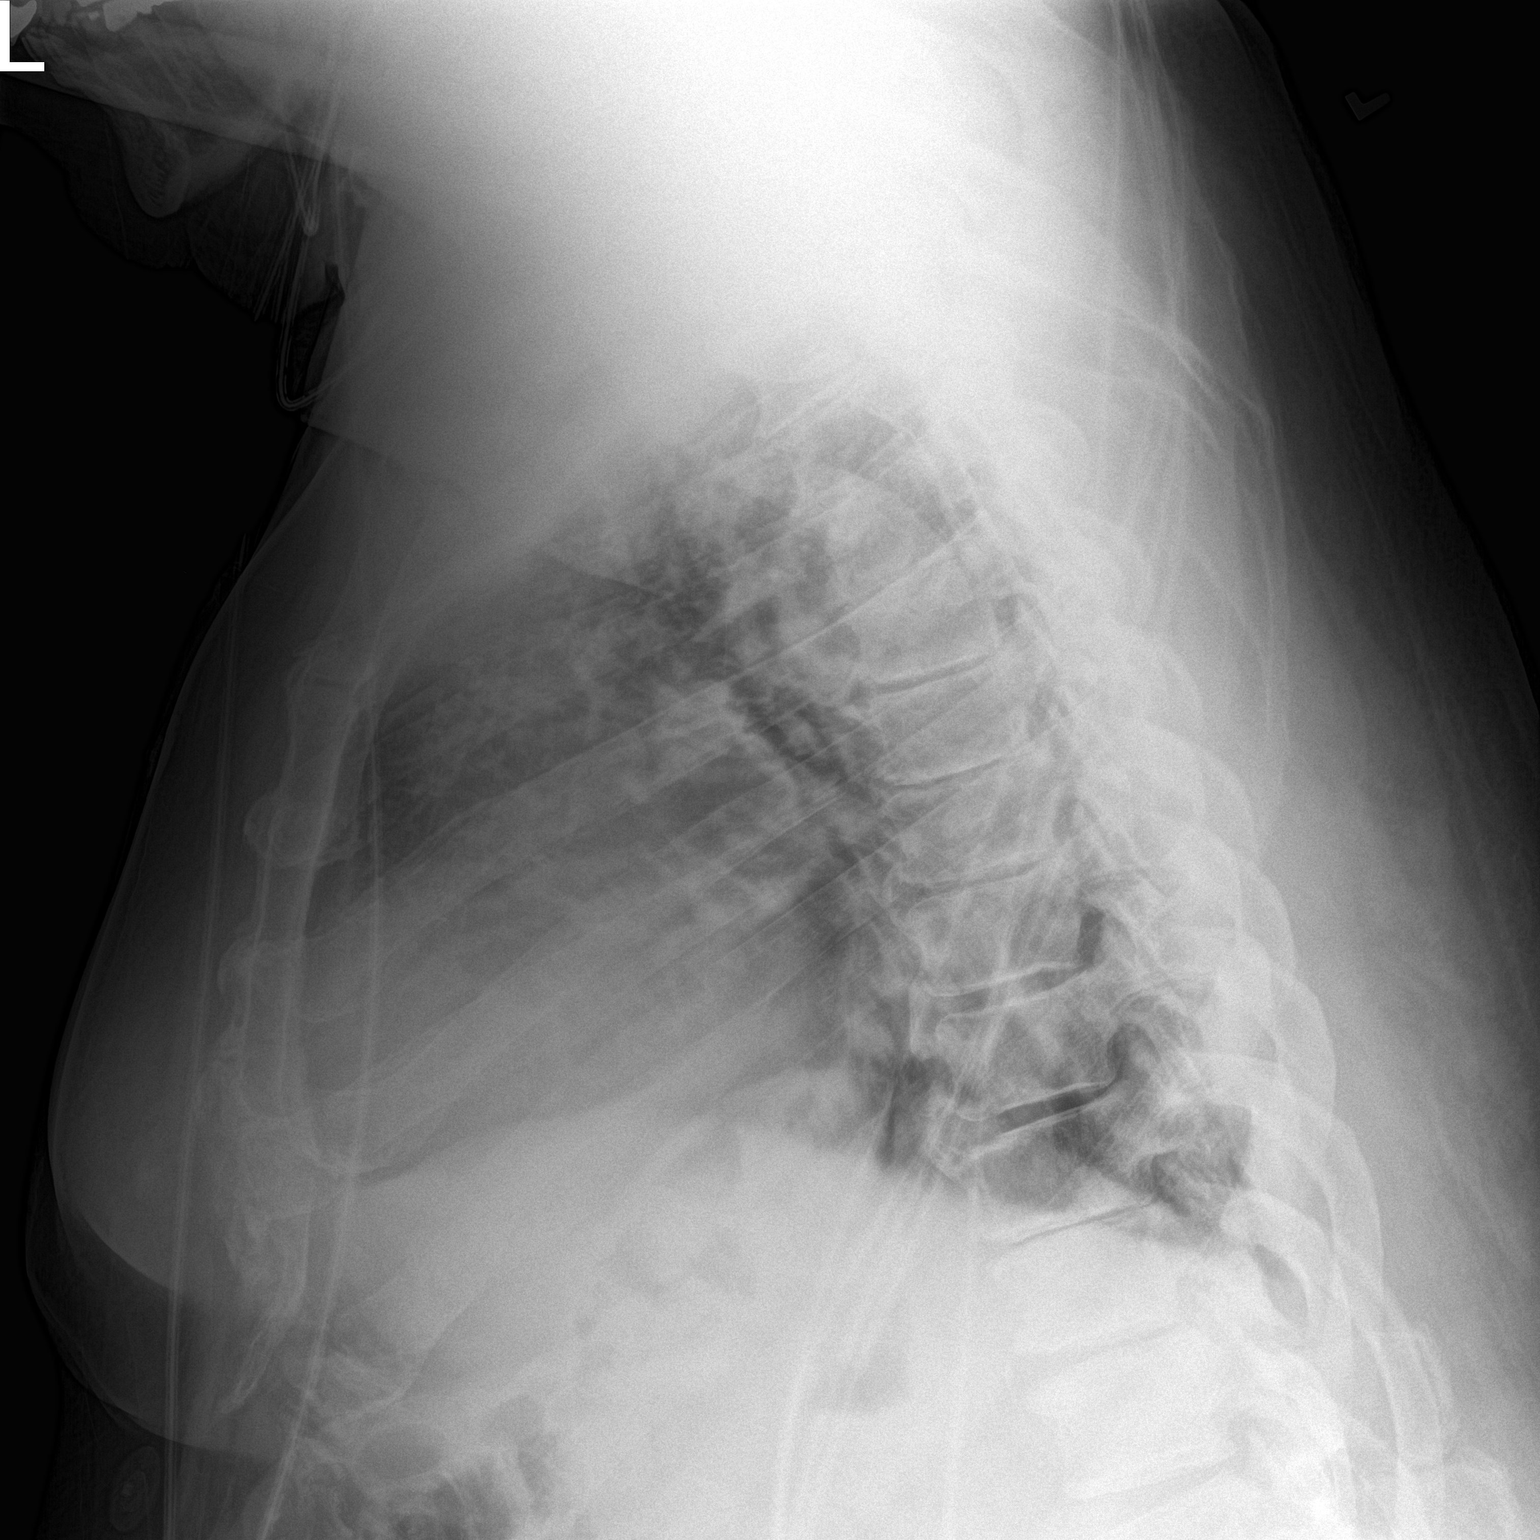

[2 of 2 positions shown; findings below may reference images not displayed]

FINDINGS: There is cardiomegaly without edema. The lungs are clear. No
pneumothorax or pleural effusion.
IMPRESSION: Cardiomegaly without acute disease.

## 2017-08-01 NOTE — Telephone Encounter (Signed)
Spoke with pt and advised him that I did not see where we had reached out to him. I did see that pt has an upcoming for a nurse visit on 08/03/17 at 3:30pm. Pt was already aware.  Copied from [place] [phone]. Topic: Quick Communication - Office Called Patient >> Jul 31, 2017  5:37 PM Corie Chiquito, [place] wrote: Reason for FIE:PPIRJJO had a missed call from the office and would like a call back at [phone]

## 2017-08-02 DIAGNOSIS — D509 Iron deficiency anemia, unspecified: Secondary | ICD-10-CM | POA: Diagnosis not present

## 2017-08-02 DIAGNOSIS — N2581 Secondary hyperparathyroidism of renal origin: Secondary | ICD-10-CM | POA: Diagnosis not present

## 2017-08-02 DIAGNOSIS — N186 End stage renal disease: Secondary | ICD-10-CM | POA: Diagnosis not present

## 2017-08-02 DIAGNOSIS — D631 Anemia in chronic kidney disease: Secondary | ICD-10-CM | POA: Diagnosis not present

## 2017-08-02 DIAGNOSIS — I4891 Unspecified atrial fibrillation: Secondary | ICD-10-CM | POA: Diagnosis not present

## 2017-08-03 ENCOUNTER — Ambulatory Visit (INDEPENDENT_AMBULATORY_CARE_PROVIDER_SITE_OTHER): Payer: Medicare Other

## 2017-08-03 DIAGNOSIS — Z7901 Long term (current) use of anticoagulants: Secondary | ICD-10-CM

## 2017-08-03 DIAGNOSIS — I4891 Unspecified atrial fibrillation: Secondary | ICD-10-CM

## 2017-08-03 DIAGNOSIS — Z5181 Encounter for therapeutic drug level monitoring: Secondary | ICD-10-CM

## 2017-08-03 LAB — POCT INR: INR: 2

## 2017-08-03 NOTE — Progress Notes (Signed)
Pre visit review using our clinic tool,if applicable. No additional management support is needed unless otherwise documented below in the visit note.   Patient in for INR check per Elby Beck. Per patient last INR = 1.8 Goal = 2.0-3.0 Patient states he is taking Coumadin 7.5 mg daily except for Monday and Friday he takes 3.75 mg   INR = 2.0  Per Dr. Nani Ravens patient to continue Coumadin as ordeerd and return for INR check in 3 weeks. Patient notified.

## 2017-08-04 DIAGNOSIS — D509 Iron deficiency anemia, unspecified: Secondary | ICD-10-CM | POA: Diagnosis not present

## 2017-08-04 DIAGNOSIS — N186 End stage renal disease: Secondary | ICD-10-CM | POA: Diagnosis not present

## 2017-08-04 DIAGNOSIS — I4891 Unspecified atrial fibrillation: Secondary | ICD-10-CM | POA: Diagnosis not present

## 2017-08-04 DIAGNOSIS — D631 Anemia in chronic kidney disease: Secondary | ICD-10-CM | POA: Diagnosis not present

## 2017-08-04 DIAGNOSIS — N2581 Secondary hyperparathyroidism of renal origin: Secondary | ICD-10-CM | POA: Diagnosis not present

## 2017-08-07 DIAGNOSIS — N2581 Secondary hyperparathyroidism of renal origin: Secondary | ICD-10-CM | POA: Diagnosis not present

## 2017-08-07 DIAGNOSIS — N186 End stage renal disease: Secondary | ICD-10-CM | POA: Diagnosis not present

## 2017-08-07 DIAGNOSIS — D631 Anemia in chronic kidney disease: Secondary | ICD-10-CM | POA: Diagnosis not present

## 2017-08-07 DIAGNOSIS — I4891 Unspecified atrial fibrillation: Secondary | ICD-10-CM | POA: Diagnosis not present

## 2017-08-07 DIAGNOSIS — D509 Iron deficiency anemia, unspecified: Secondary | ICD-10-CM | POA: Diagnosis not present

## 2017-08-09 DIAGNOSIS — D631 Anemia in chronic kidney disease: Secondary | ICD-10-CM | POA: Diagnosis not present

## 2017-08-09 DIAGNOSIS — I4891 Unspecified atrial fibrillation: Secondary | ICD-10-CM | POA: Diagnosis not present

## 2017-08-09 DIAGNOSIS — N186 End stage renal disease: Secondary | ICD-10-CM | POA: Diagnosis not present

## 2017-08-09 DIAGNOSIS — N2581 Secondary hyperparathyroidism of renal origin: Secondary | ICD-10-CM | POA: Diagnosis not present

## 2017-08-09 DIAGNOSIS — D509 Iron deficiency anemia, unspecified: Secondary | ICD-10-CM | POA: Diagnosis not present

## 2017-08-09 LAB — PROTIME-INR: INR: 2.3 — AB (ref 0.9–1.1)

## 2017-08-10 ENCOUNTER — Other Ambulatory Visit: Payer: Self-pay | Admitting: Rheumatology

## 2017-08-11 DIAGNOSIS — D509 Iron deficiency anemia, unspecified: Secondary | ICD-10-CM | POA: Diagnosis not present

## 2017-08-11 DIAGNOSIS — I4891 Unspecified atrial fibrillation: Secondary | ICD-10-CM | POA: Diagnosis not present

## 2017-08-11 DIAGNOSIS — D631 Anemia in chronic kidney disease: Secondary | ICD-10-CM | POA: Diagnosis not present

## 2017-08-11 DIAGNOSIS — N2581 Secondary hyperparathyroidism of renal origin: Secondary | ICD-10-CM | POA: Diagnosis not present

## 2017-08-11 DIAGNOSIS — N186 End stage renal disease: Secondary | ICD-10-CM | POA: Diagnosis not present

## 2017-08-14 DIAGNOSIS — N186 End stage renal disease: Secondary | ICD-10-CM | POA: Diagnosis not present

## 2017-08-14 DIAGNOSIS — N2581 Secondary hyperparathyroidism of renal origin: Secondary | ICD-10-CM | POA: Diagnosis not present

## 2017-08-14 DIAGNOSIS — D509 Iron deficiency anemia, unspecified: Secondary | ICD-10-CM | POA: Diagnosis not present

## 2017-08-14 DIAGNOSIS — D631 Anemia in chronic kidney disease: Secondary | ICD-10-CM | POA: Diagnosis not present

## 2017-08-14 DIAGNOSIS — I4891 Unspecified atrial fibrillation: Secondary | ICD-10-CM | POA: Diagnosis not present

## 2017-08-15 ENCOUNTER — Telehealth: Payer: Self-pay | Admitting: *Deleted

## 2017-08-15 NOTE — Telephone Encounter (Signed)
Received PT/INR results from [facility]; forwarded to provider/SLS 01/29

## 2017-08-16 DIAGNOSIS — I4891 Unspecified atrial fibrillation: Secondary | ICD-10-CM | POA: Diagnosis not present

## 2017-08-16 DIAGNOSIS — D509 Iron deficiency anemia, unspecified: Secondary | ICD-10-CM | POA: Diagnosis not present

## 2017-08-16 DIAGNOSIS — N2581 Secondary hyperparathyroidism of renal origin: Secondary | ICD-10-CM | POA: Diagnosis not present

## 2017-08-16 DIAGNOSIS — N186 End stage renal disease: Secondary | ICD-10-CM | POA: Diagnosis not present

## 2017-08-16 DIAGNOSIS — D631 Anemia in chronic kidney disease: Secondary | ICD-10-CM | POA: Diagnosis not present

## 2017-08-17 ENCOUNTER — Other Ambulatory Visit: Payer: Self-pay | Admitting: Internal Medicine

## 2017-08-17 ENCOUNTER — Ambulatory Visit: Payer: Self-pay

## 2017-08-17 DIAGNOSIS — Z992 Dependence on renal dialysis: Secondary | ICD-10-CM | POA: Diagnosis not present

## 2017-08-17 DIAGNOSIS — I158 Other secondary hypertension: Secondary | ICD-10-CM | POA: Diagnosis not present

## 2017-08-17 DIAGNOSIS — N186 End stage renal disease: Secondary | ICD-10-CM | POA: Diagnosis not present

## 2017-08-17 NOTE — Telephone Encounter (Signed)
   Reason for Disposition . Cough  Answer Assessment - Initial Assessment Questions 1. ONSET: "When did the cough begin?"      Started Christmas 2. SEVERITY: "How bad is the cough today?"      Mild 3. RESPIRATORY DISTRESS: "Describe your breathing."      No distress 4. FEVER: "Do you have a fever?" If so, ask: "What is your temperature, how was it measured, and when did it start?"     No 5. HEMOPTYSIS: "Are you coughing up any blood?" If so ask: "How much?" (flecks, streaks, tablespoons, etc.)     No 6. TREATMENT: "What have you done so far to treat the cough?" (e.g., meds, fluids, humidifier)      No medications 7. CARDIAC HISTORY: "Do you have any history of heart disease?" (e.g., heart attack, congestive heart failure)      A-fib 8. LUNG HISTORY: "Do you have any history of lung disease?"  (e.g., pulmonary embolus, asthma, emphysema)     No 9. PE RISK FACTORS: "Do you have a history of blood clots?" (or: recent major surgery, recent prolonged travel, bedridden )     NO 10. OTHER SYMPTOMS: "Do you have any other symptoms? (e.g., runny nose, wheezing, chest pain)       No 11. PREGNANCY: "Is there any chance you are pregnant?" "When was your last menstrual period?"       No 12. TRAVEL: "Have you traveled out of the country in the last month?" (e.g., travel history, exposures)       No  Protocols used: COUGH - ACUTE NON-PRODUCTIVE-A-AH  Will try home remedies.

## 2017-08-18 DIAGNOSIS — I4891 Unspecified atrial fibrillation: Secondary | ICD-10-CM | POA: Diagnosis not present

## 2017-08-18 DIAGNOSIS — D631 Anemia in chronic kidney disease: Secondary | ICD-10-CM | POA: Diagnosis not present

## 2017-08-18 DIAGNOSIS — I158 Other secondary hypertension: Secondary | ICD-10-CM | POA: Diagnosis not present

## 2017-08-18 DIAGNOSIS — N186 End stage renal disease: Secondary | ICD-10-CM | POA: Diagnosis not present

## 2017-08-18 DIAGNOSIS — N2581 Secondary hyperparathyroidism of renal origin: Secondary | ICD-10-CM | POA: Diagnosis not present

## 2017-08-18 DIAGNOSIS — Z992 Dependence on renal dialysis: Secondary | ICD-10-CM | POA: Diagnosis not present

## 2017-08-18 LAB — HM DIABETES EYE EXAM

## 2017-08-21 DIAGNOSIS — I4891 Unspecified atrial fibrillation: Secondary | ICD-10-CM | POA: Diagnosis not present

## 2017-08-21 DIAGNOSIS — D631 Anemia in chronic kidney disease: Secondary | ICD-10-CM | POA: Diagnosis not present

## 2017-08-21 DIAGNOSIS — N2581 Secondary hyperparathyroidism of renal origin: Secondary | ICD-10-CM | POA: Diagnosis not present

## 2017-08-21 DIAGNOSIS — N186 End stage renal disease: Secondary | ICD-10-CM | POA: Diagnosis not present

## 2017-08-23 DIAGNOSIS — D631 Anemia in chronic kidney disease: Secondary | ICD-10-CM | POA: Diagnosis not present

## 2017-08-23 DIAGNOSIS — N2581 Secondary hyperparathyroidism of renal origin: Secondary | ICD-10-CM | POA: Diagnosis not present

## 2017-08-23 DIAGNOSIS — N186 End stage renal disease: Secondary | ICD-10-CM | POA: Diagnosis not present

## 2017-08-23 DIAGNOSIS — I4891 Unspecified atrial fibrillation: Secondary | ICD-10-CM | POA: Diagnosis not present

## 2017-08-24 ENCOUNTER — Ambulatory Visit (INDEPENDENT_AMBULATORY_CARE_PROVIDER_SITE_OTHER): Payer: Medicare Other

## 2017-08-24 DIAGNOSIS — Z7901 Long term (current) use of anticoagulants: Secondary | ICD-10-CM

## 2017-08-24 LAB — POCT INR: INR: 4.8

## 2017-08-24 NOTE — Progress Notes (Signed)
Pt is taking 1/2 tablet on Monday and Friday and 1 whole tablet every other day. Rx'd 7.5mg  of warfarin. After consulting with Dr.Wendling Pt is to take half a tablet everyday except on Monday, Monday Pt will take 1 whole tablet. Pt has been advised to follow up with nurse visit for INR 1 week from Monday ( September 04 2017). Pt states he will have to come in on a Tuesday but will otherwise comply as advised.

## 2017-08-25 DIAGNOSIS — N2581 Secondary hyperparathyroidism of renal origin: Secondary | ICD-10-CM | POA: Diagnosis not present

## 2017-08-25 DIAGNOSIS — I4891 Unspecified atrial fibrillation: Secondary | ICD-10-CM | POA: Diagnosis not present

## 2017-08-25 DIAGNOSIS — D631 Anemia in chronic kidney disease: Secondary | ICD-10-CM | POA: Diagnosis not present

## 2017-08-25 DIAGNOSIS — N186 End stage renal disease: Secondary | ICD-10-CM | POA: Diagnosis not present

## 2017-08-28 DIAGNOSIS — N2581 Secondary hyperparathyroidism of renal origin: Secondary | ICD-10-CM | POA: Diagnosis not present

## 2017-08-28 DIAGNOSIS — I4891 Unspecified atrial fibrillation: Secondary | ICD-10-CM | POA: Diagnosis not present

## 2017-08-28 DIAGNOSIS — D631 Anemia in chronic kidney disease: Secondary | ICD-10-CM | POA: Diagnosis not present

## 2017-08-28 DIAGNOSIS — N186 End stage renal disease: Secondary | ICD-10-CM | POA: Diagnosis not present

## 2017-08-30 DIAGNOSIS — N2581 Secondary hyperparathyroidism of renal origin: Secondary | ICD-10-CM | POA: Diagnosis not present

## 2017-08-30 DIAGNOSIS — N186 End stage renal disease: Secondary | ICD-10-CM | POA: Diagnosis not present

## 2017-08-30 DIAGNOSIS — D631 Anemia in chronic kidney disease: Secondary | ICD-10-CM | POA: Diagnosis not present

## 2017-08-30 DIAGNOSIS — I4891 Unspecified atrial fibrillation: Secondary | ICD-10-CM | POA: Diagnosis not present

## 2017-09-01 DIAGNOSIS — I4891 Unspecified atrial fibrillation: Secondary | ICD-10-CM | POA: Diagnosis not present

## 2017-09-01 DIAGNOSIS — N2581 Secondary hyperparathyroidism of renal origin: Secondary | ICD-10-CM | POA: Diagnosis not present

## 2017-09-01 DIAGNOSIS — N186 End stage renal disease: Secondary | ICD-10-CM | POA: Diagnosis not present

## 2017-09-01 DIAGNOSIS — D631 Anemia in chronic kidney disease: Secondary | ICD-10-CM | POA: Diagnosis not present

## 2017-09-04 DIAGNOSIS — I4891 Unspecified atrial fibrillation: Secondary | ICD-10-CM | POA: Diagnosis not present

## 2017-09-04 DIAGNOSIS — D631 Anemia in chronic kidney disease: Secondary | ICD-10-CM | POA: Diagnosis not present

## 2017-09-04 DIAGNOSIS — N186 End stage renal disease: Secondary | ICD-10-CM | POA: Diagnosis not present

## 2017-09-04 DIAGNOSIS — N2581 Secondary hyperparathyroidism of renal origin: Secondary | ICD-10-CM | POA: Diagnosis not present

## 2017-09-05 ENCOUNTER — Ambulatory Visit: Payer: Medicare Other

## 2017-09-05 DIAGNOSIS — D631 Anemia in chronic kidney disease: Secondary | ICD-10-CM | POA: Diagnosis not present

## 2017-09-05 DIAGNOSIS — N186 End stage renal disease: Secondary | ICD-10-CM | POA: Diagnosis not present

## 2017-09-05 DIAGNOSIS — I4891 Unspecified atrial fibrillation: Secondary | ICD-10-CM | POA: Diagnosis not present

## 2017-09-05 DIAGNOSIS — N2581 Secondary hyperparathyroidism of renal origin: Secondary | ICD-10-CM | POA: Diagnosis not present

## 2017-09-08 ENCOUNTER — Ambulatory Visit (INDEPENDENT_AMBULATORY_CARE_PROVIDER_SITE_OTHER): Payer: Medicare Other

## 2017-09-08 DIAGNOSIS — I4891 Unspecified atrial fibrillation: Secondary | ICD-10-CM | POA: Diagnosis not present

## 2017-09-08 DIAGNOSIS — N186 End stage renal disease: Secondary | ICD-10-CM | POA: Diagnosis not present

## 2017-09-08 DIAGNOSIS — D631 Anemia in chronic kidney disease: Secondary | ICD-10-CM | POA: Diagnosis not present

## 2017-09-08 DIAGNOSIS — N2581 Secondary hyperparathyroidism of renal origin: Secondary | ICD-10-CM | POA: Diagnosis not present

## 2017-09-08 LAB — POCT INR: INR: 2.3

## 2017-09-08 NOTE — Addendum Note (Signed)
Addended by: Magdalene Molly A on: 09/08/2017 04:07 PM   Modules accepted: Level of Service

## 2017-09-08 NOTE — Progress Notes (Signed)
Patient in for INR check per Elby Beck. Per patient last INR = 4.6 Goal = 2.0-3.0 Patient states he is taking Coumadin 1/2 of the 7.5 mg daily except for Monday take 1 whole 7.5mg  tablet.  INR = 2.3  Per M. Conley Canal  patient to continue Coumadin as ordeerd and return for INR check in 2 weeks. Patient notified.   He voiced his understanding.

## 2017-09-08 NOTE — Patient Instructions (Signed)
Bleeding Precautions When on Anticoagulant Therapy  WHAT IS ANTICOAGULANT THERAPY?  Anticoagulant therapy is taking medicine to prevent or reduce blood clots. It is also called blood thinner therapy. Blood clots that form in your blood vessels can be dangerous. They can break loose and travel to your heart, lungs, or brain. This increases your risk of a heart attack or stroke. Anticoagulant therapy causes blood to clot more slowly.  You may need anticoagulant therapy if you have:   A medical condition that increases the likelihood that blood clots will form.   A heart defect or a problem with heart rhythm.  It is also a common treatment after heart surgery, such as valve replacement.  WHAT ARE COMMON TYPES OF ANTICOAGULANT THERAPY?  Anticoagulant medicine can be injected or taken by mouth.If you need anticoagulant therapy quickly at the hospital, the medicine may be injected under your skin or given through an IV tube. Heparin is a common example of an anticoagulant that you may get at the hospital.  Most anticoagulant therapy is in the form of pills that you take at home every day. These may include:   Aspirin. This common blood thinner works by preventing blood cells (platelets) from sticking together to form a clot. Aspirin is not as strong as anticoagulants that slow down the time that it takes for your body to form a clot.   Clopidogrel. This is a newer type of drug that affects platelets. It is stronger than aspirin.   Warfarin. This is the most common anticoagulant. It changes the way your body uses vitamin K, a vitamin that helps your blood to clot. The risk of bleeding is higher with warfarin than with aspirin. You will need frequent blood tests to make sure you are taking the safest amount.   New anticoagulants. Several new drugs have been approved. They are all taken by mouth. Studies show that these drugs work as well as warfarin. They do not require blood testing. They may cause less bleeding  risk than warfarin.  WHAT DO I NEED TO REMEMBER WHEN TAKING ANTICOAGULANT THERAPY?  Anticoagulant therapy decreases your risk of forming a blood clot, but it increases your risk of bleeding. Work closely with your health care provider to make sure you are taking your medicine safely. These tips can help:   Learn ways to reduce your risk of bleeding.   If you are taking warfarin:  ? Have blood tests as ordered by your health care provider.  ? Do not make any sudden changes to your diet. Vitamin K in your diet can make warfarin less effective.  ? Do not get pregnant. This medicine may cause birth defects.   Take your medicine at the same time every day. If you forget to take your medicine, take it as soon as you remember. If you miss a whole day, do not double your dose of medicine. Take your normal dose and call your health care provider to check in.   Do not stop taking your medicine on your own.   Tell your health care provider before you start taking any new medicine, vitamin, or herbal product. Some of these could interfere with your therapy.   Tell all of your health care providers that you are on anticoagulant therapy.   Do not have surgery, medical procedures, or dental work until you tell your health care provider that you are on anticoagulant therapy.  WHAT CAN AFFECT HOW ANTICOAGULANTS WORK?  Certain foods, vitamins, medicines, supplements, and herbal   medicines change the way that anticoagulant therapy works. They may increase or decrease the effects of your anticoagulant therapy. Either result can be dangerous for you.   Many over-the-counter medicines for pain, colds, or stomach problems interfere with anticoagulant therapy. Take these only as told by your health care provider.   Do not drink alcohol. It can interfere with your medicine and increase your risk of an injury that causes bleeding.   If you are taking warfarin, do not begin eating more foods that contain vitamin K. These include  leafy green vegetables. Ask your health care provider if you should avoid any foods.  WHAT ARE SOME WAYS TO PREVENT BLEEDING?  You can prevent bleeding by taking certain precautions:   Be extra careful when you use knives, scissors, or other sharp objects.   Use an electric razor instead of a blade.   Do not use toothpicks.   Use a soft toothbrush.   Wear shoes that have nonskid soles.   Use bath mats and handrails in your bathroom.   Wear gloves while you do yard work.   Wear a helmet when you ride a bike.   Wear your seat belt.   Prevent falls by removing loose rugs and extension cords from areas where you walk.   Do not play contact sports or participate in other activities that have a high risk of injury.  WHEN SHOULD I CONTACT MY HEALTH CARE PROVIDER?  Call your health care provider if:   You miss a dose of medicine:  ? And you are not sure what to do.  ? For more than one day.   You have:  ? Menstrual bleeding that is heavier than normal.  ? Blood in your urine.  ? A bloody nose or bleeding gums.  ? Easy bruising.  ? Blood in your stool (feces) or have black and tarry stool.  ? Side effects from your medicine.   You feel weak or dizzy.   You become pregnant.  Seek immediate medical care if:   You have bleeding that will not stop.   You have sudden and severe headache or belly pain.   You vomit or you cough up bright red blood.   You have a severe blow to your head.  WHAT ARE SOME QUESTIONS TO ASK MY HEALTH CARE PROVIDER?   What is the best anticoagulant therapy for my condition?   What side effects should I watch for?   When should I take my medicine? What should I do if I forget to take it?   Will I need to have regular blood tests?   Do I need to change my diet? Are there foods or drinks that I should avoid?   What activities are safe for me?   What should I do if I want to get pregnant?  This information is not intended to replace advice given to you by your health care provider.  Make sure you discuss any questions you have with your health care provider.  Document Released: 06/15/2015 Document Reviewed: 06/15/2015  Elsevier Interactive Patient Education  2017 [employer]

## 2017-09-11 DIAGNOSIS — I4891 Unspecified atrial fibrillation: Secondary | ICD-10-CM | POA: Diagnosis not present

## 2017-09-11 DIAGNOSIS — D631 Anemia in chronic kidney disease: Secondary | ICD-10-CM | POA: Diagnosis not present

## 2017-09-11 DIAGNOSIS — N186 End stage renal disease: Secondary | ICD-10-CM | POA: Diagnosis not present

## 2017-09-11 DIAGNOSIS — N2581 Secondary hyperparathyroidism of renal origin: Secondary | ICD-10-CM | POA: Diagnosis not present

## 2017-09-13 ENCOUNTER — Encounter: Payer: Self-pay | Admitting: Family

## 2017-09-13 DIAGNOSIS — I4891 Unspecified atrial fibrillation: Secondary | ICD-10-CM | POA: Diagnosis not present

## 2017-09-13 DIAGNOSIS — N186 End stage renal disease: Secondary | ICD-10-CM | POA: Diagnosis not present

## 2017-09-13 DIAGNOSIS — D631 Anemia in chronic kidney disease: Secondary | ICD-10-CM | POA: Diagnosis not present

## 2017-09-13 DIAGNOSIS — N2581 Secondary hyperparathyroidism of renal origin: Secondary | ICD-10-CM | POA: Diagnosis not present

## 2017-09-13 LAB — PROTIME-INR

## 2017-09-13 NOTE — Progress Notes (Signed)
Nance Pear NP  Reviewed and agree.

## 2017-09-14 ENCOUNTER — Other Ambulatory Visit: Payer: Self-pay | Admitting: Family

## 2017-09-15 DIAGNOSIS — I4891 Unspecified atrial fibrillation: Secondary | ICD-10-CM | POA: Diagnosis not present

## 2017-09-15 DIAGNOSIS — Z992 Dependence on renal dialysis: Secondary | ICD-10-CM | POA: Diagnosis not present

## 2017-09-15 DIAGNOSIS — E8770 Fluid overload, unspecified: Secondary | ICD-10-CM | POA: Diagnosis not present

## 2017-09-15 DIAGNOSIS — N2581 Secondary hyperparathyroidism of renal origin: Secondary | ICD-10-CM | POA: Diagnosis not present

## 2017-09-15 DIAGNOSIS — D509 Iron deficiency anemia, unspecified: Secondary | ICD-10-CM | POA: Diagnosis not present

## 2017-09-15 DIAGNOSIS — N186 End stage renal disease: Secondary | ICD-10-CM | POA: Diagnosis not present

## 2017-09-15 DIAGNOSIS — I158 Other secondary hypertension: Secondary | ICD-10-CM | POA: Diagnosis not present

## 2017-09-15 DIAGNOSIS — D631 Anemia in chronic kidney disease: Secondary | ICD-10-CM | POA: Diagnosis not present

## 2017-09-18 DIAGNOSIS — N2581 Secondary hyperparathyroidism of renal origin: Secondary | ICD-10-CM | POA: Diagnosis not present

## 2017-09-18 DIAGNOSIS — I4891 Unspecified atrial fibrillation: Secondary | ICD-10-CM | POA: Diagnosis not present

## 2017-09-18 DIAGNOSIS — E8770 Fluid overload, unspecified: Secondary | ICD-10-CM | POA: Diagnosis not present

## 2017-09-18 DIAGNOSIS — N186 End stage renal disease: Secondary | ICD-10-CM | POA: Diagnosis not present

## 2017-09-18 DIAGNOSIS — D631 Anemia in chronic kidney disease: Secondary | ICD-10-CM | POA: Diagnosis not present

## 2017-09-18 DIAGNOSIS — D509 Iron deficiency anemia, unspecified: Secondary | ICD-10-CM | POA: Diagnosis not present

## 2017-09-19 DIAGNOSIS — Z79899 Other long term (current) drug therapy: Secondary | ICD-10-CM | POA: Diagnosis not present

## 2017-09-19 DIAGNOSIS — H35033 Hypertensive retinopathy, bilateral: Secondary | ICD-10-CM | POA: Diagnosis not present

## 2017-09-20 DIAGNOSIS — N2581 Secondary hyperparathyroidism of renal origin: Secondary | ICD-10-CM | POA: Diagnosis not present

## 2017-09-20 DIAGNOSIS — N186 End stage renal disease: Secondary | ICD-10-CM | POA: Diagnosis not present

## 2017-09-20 DIAGNOSIS — I4891 Unspecified atrial fibrillation: Secondary | ICD-10-CM | POA: Diagnosis not present

## 2017-09-20 DIAGNOSIS — D631 Anemia in chronic kidney disease: Secondary | ICD-10-CM | POA: Diagnosis not present

## 2017-09-20 DIAGNOSIS — D509 Iron deficiency anemia, unspecified: Secondary | ICD-10-CM | POA: Diagnosis not present

## 2017-09-20 DIAGNOSIS — E8770 Fluid overload, unspecified: Secondary | ICD-10-CM | POA: Diagnosis not present

## 2017-09-21 ENCOUNTER — Ambulatory Visit: Payer: Medicare Other

## 2017-09-21 ENCOUNTER — Ambulatory Visit (INDEPENDENT_AMBULATORY_CARE_PROVIDER_SITE_OTHER): Payer: Medicare Other

## 2017-09-21 DIAGNOSIS — Z7901 Long term (current) use of anticoagulants: Secondary | ICD-10-CM | POA: Diagnosis not present

## 2017-09-21 LAB — POCT INR: INR: 1.2

## 2017-09-21 NOTE — Progress Notes (Signed)
Pre visit review using our clinic review tool, if applicable. No additional management support is needed unless otherwise documented below in the visit note.  Pt here today for INR check.     Pt currently taking Coumadin 7.5mg  1/2 tablet (3.75mg ) daily except for Mondays take 1 whole tablet.   Pt denies missing doses, bleeding, etc.   Last INR: 2.3. Goal: 2.0-3.0.   INR today: 1.2.   Per Dr. Nani Ravens- Take 7.5mg  on Monday, Wednesday, and Fridays and 1/2 tablets all other days. Recheck 10 days.   Pt verbalized understanding.  He already has an appt w/ [name] scheduled for 10/03/2017- inr check at that visit.

## 2017-09-21 NOTE — Progress Notes (Signed)
Noted. Agree with above.  Kimball, DO 09/21/17 4:29 PM

## 2017-09-21 NOTE — Patient Instructions (Signed)
Take 7.5mg  (1 tablet) on Mondays, Wednesdays, and Fridays and 1/2 tablet (3.75mg ) all other days. Return in 10 days for recheck.

## 2017-09-22 DIAGNOSIS — N186 End stage renal disease: Secondary | ICD-10-CM | POA: Diagnosis not present

## 2017-09-22 DIAGNOSIS — N2581 Secondary hyperparathyroidism of renal origin: Secondary | ICD-10-CM | POA: Diagnosis not present

## 2017-09-22 DIAGNOSIS — E8770 Fluid overload, unspecified: Secondary | ICD-10-CM | POA: Diagnosis not present

## 2017-09-22 DIAGNOSIS — D631 Anemia in chronic kidney disease: Secondary | ICD-10-CM | POA: Diagnosis not present

## 2017-09-22 DIAGNOSIS — I4891 Unspecified atrial fibrillation: Secondary | ICD-10-CM | POA: Diagnosis not present

## 2017-09-22 DIAGNOSIS — D509 Iron deficiency anemia, unspecified: Secondary | ICD-10-CM | POA: Diagnosis not present

## 2017-09-25 DIAGNOSIS — D631 Anemia in chronic kidney disease: Secondary | ICD-10-CM | POA: Diagnosis not present

## 2017-09-25 DIAGNOSIS — N186 End stage renal disease: Secondary | ICD-10-CM | POA: Diagnosis not present

## 2017-09-25 DIAGNOSIS — E8770 Fluid overload, unspecified: Secondary | ICD-10-CM | POA: Diagnosis not present

## 2017-09-25 DIAGNOSIS — D509 Iron deficiency anemia, unspecified: Secondary | ICD-10-CM | POA: Diagnosis not present

## 2017-09-25 DIAGNOSIS — N2581 Secondary hyperparathyroidism of renal origin: Secondary | ICD-10-CM | POA: Diagnosis not present

## 2017-09-25 DIAGNOSIS — I4891 Unspecified atrial fibrillation: Secondary | ICD-10-CM | POA: Diagnosis not present

## 2017-09-26 DIAGNOSIS — D509 Iron deficiency anemia, unspecified: Secondary | ICD-10-CM | POA: Diagnosis not present

## 2017-09-26 DIAGNOSIS — I4891 Unspecified atrial fibrillation: Secondary | ICD-10-CM | POA: Diagnosis not present

## 2017-09-26 DIAGNOSIS — N2581 Secondary hyperparathyroidism of renal origin: Secondary | ICD-10-CM | POA: Diagnosis not present

## 2017-09-26 DIAGNOSIS — E8770 Fluid overload, unspecified: Secondary | ICD-10-CM | POA: Diagnosis not present

## 2017-09-26 DIAGNOSIS — D631 Anemia in chronic kidney disease: Secondary | ICD-10-CM | POA: Diagnosis not present

## 2017-09-26 DIAGNOSIS — N186 End stage renal disease: Secondary | ICD-10-CM | POA: Diagnosis not present

## 2017-09-27 DIAGNOSIS — N186 End stage renal disease: Secondary | ICD-10-CM | POA: Diagnosis not present

## 2017-09-27 DIAGNOSIS — E8770 Fluid overload, unspecified: Secondary | ICD-10-CM | POA: Diagnosis not present

## 2017-09-27 DIAGNOSIS — D509 Iron deficiency anemia, unspecified: Secondary | ICD-10-CM | POA: Diagnosis not present

## 2017-09-27 DIAGNOSIS — D631 Anemia in chronic kidney disease: Secondary | ICD-10-CM | POA: Diagnosis not present

## 2017-09-27 DIAGNOSIS — I4891 Unspecified atrial fibrillation: Secondary | ICD-10-CM | POA: Diagnosis not present

## 2017-09-27 DIAGNOSIS — N2581 Secondary hyperparathyroidism of renal origin: Secondary | ICD-10-CM | POA: Diagnosis not present

## 2017-09-29 DIAGNOSIS — N2581 Secondary hyperparathyroidism of renal origin: Secondary | ICD-10-CM | POA: Diagnosis not present

## 2017-09-29 DIAGNOSIS — E8770 Fluid overload, unspecified: Secondary | ICD-10-CM | POA: Diagnosis not present

## 2017-09-29 DIAGNOSIS — D509 Iron deficiency anemia, unspecified: Secondary | ICD-10-CM | POA: Diagnosis not present

## 2017-09-29 DIAGNOSIS — D631 Anemia in chronic kidney disease: Secondary | ICD-10-CM | POA: Diagnosis not present

## 2017-09-29 DIAGNOSIS — I4891 Unspecified atrial fibrillation: Secondary | ICD-10-CM | POA: Diagnosis not present

## 2017-09-29 DIAGNOSIS — N186 End stage renal disease: Secondary | ICD-10-CM | POA: Diagnosis not present

## 2017-10-02 DIAGNOSIS — N186 End stage renal disease: Secondary | ICD-10-CM | POA: Diagnosis not present

## 2017-10-02 DIAGNOSIS — E8770 Fluid overload, unspecified: Secondary | ICD-10-CM | POA: Diagnosis not present

## 2017-10-02 DIAGNOSIS — I4891 Unspecified atrial fibrillation: Secondary | ICD-10-CM | POA: Diagnosis not present

## 2017-10-02 DIAGNOSIS — N2581 Secondary hyperparathyroidism of renal origin: Secondary | ICD-10-CM | POA: Diagnosis not present

## 2017-10-02 DIAGNOSIS — D631 Anemia in chronic kidney disease: Secondary | ICD-10-CM | POA: Diagnosis not present

## 2017-10-02 DIAGNOSIS — D509 Iron deficiency anemia, unspecified: Secondary | ICD-10-CM | POA: Diagnosis not present

## 2017-10-03 ENCOUNTER — Encounter: Payer: Self-pay | Admitting: Family

## 2017-10-03 ENCOUNTER — Ambulatory Visit (INDEPENDENT_AMBULATORY_CARE_PROVIDER_SITE_OTHER): Payer: Medicare Other | Admitting: Family

## 2017-10-03 VITALS — BP 116/63 | HR 44 | Temp 97.6°F | Resp 16 | Ht 74.0 in | Wt 273.0 lb

## 2017-10-03 DIAGNOSIS — Z7901 Long term (current) use of anticoagulants: Secondary | ICD-10-CM

## 2017-10-03 DIAGNOSIS — R739 Hyperglycemia, unspecified: Secondary | ICD-10-CM

## 2017-10-03 DIAGNOSIS — M109 Gout, unspecified: Secondary | ICD-10-CM | POA: Diagnosis not present

## 2017-10-03 DIAGNOSIS — M069 Rheumatoid arthritis, unspecified: Secondary | ICD-10-CM | POA: Diagnosis not present

## 2017-10-03 DIAGNOSIS — J309 Allergic rhinitis, unspecified: Secondary | ICD-10-CM | POA: Diagnosis not present

## 2017-10-03 DIAGNOSIS — N186 End stage renal disease: Secondary | ICD-10-CM

## 2017-10-03 DIAGNOSIS — G6289 Other specified polyneuropathies: Secondary | ICD-10-CM

## 2017-10-03 DIAGNOSIS — E785 Hyperlipidemia, unspecified: Secondary | ICD-10-CM | POA: Diagnosis not present

## 2017-10-03 LAB — POCT INR: INR: 3.1

## 2017-10-03 LAB — LIPID PANEL
CHOLESTEROL: 129 mg/dL (ref 0–200)
HDL: 66.3 mg/dL (ref >39.00)
LDL CALC: 51 mg/dL (ref 0–99)
NonHDL: 62.79
Total CHOL/HDL Ratio: 2
Triglycerides: 57 mg/dL (ref 0.0–149.0)
VLDL: 11.4 mg/dL (ref 0.0–40.0)

## 2017-10-03 LAB — HEMOGLOBIN A1C: HEMOGLOBIN A1C: 6.6 % — AB (ref 4.6–6.5)

## 2017-10-03 MED ORDER — FUROSEMIDE 40 MG PO TABS: ORAL_TABLET | ORAL | 1 refills | Status: DC

## 2017-10-03 MED ORDER — FLUTICASONE PROPIONATE 50 MCG/ACT NA SUSP: NASAL | 3 refills | Status: DC

## 2017-10-03 NOTE — Progress Notes (Signed)
Subjective:    Patient ID: Bruce Cole, male    DOB: 1941/07/04, 77 y.o.   MRN: [id number]  HPI  Patient is a 77 year old male who presents today for follow-up.  Hypertension-he is maintained on lasix.  BP Readings from Last 3 Encounters:  10/03/17 116/63  07/04/17 (!) 112/52  02/20/17 (!) 121/49   Nasal congestion-reports nasal congestion which is been present for approximately 1 month. Had some chest congestion and cough.  He had some cheratussin on hand which helped. Cough is much improved.  Still having some nasal congestion.  Denies sinus pressure/pain or fever.    Hyperlipidemia-Continues atorvastatin.  Lab Results  Component Value Date   CHOL 178 11/14/2016   HDL 73.20 11/14/2016   LDLCALC 86 11/14/2016   TRIG 95.0 11/14/2016   CHOLHDL 2 11/14/2016   End-stage renal disease-continues hemodialysis.  Neuropathy- continues gabapentin. Helps pain.   Gout- no recent gout flare.   Atrial fibrillation-maintained on Coumadin. Current regimen is as follows:  Take 7.5mg  on Monday, Wednesday, and Fridays and 1/2 tablets all other days.  Thinks that he took more days of 7.5 than he should have.    RA- continues with Dr. Estanislado Pandy- reports that his symptoms are stable on current regimen.   Review of Systems See HPI  Past Medical History:  Diagnosis Date  . Acute blood loss anemia   . Atrial fibrillation (HCC)    Chronic  . Chronic combined systolic and diastolic heart failure ([place])   . Colon polyp 2000  . Dysrhythmia    hx  . ESRD on hemodialysis ([place])    [place]  . Essential hypertension   . Gastritis and gastroduodenitis   . GI bleed   . Gout   . Heart murmur   . History of blood transfusion   . History of kidney stones   . Nephrolithiasis   . OSA (obstructive sleep apnea) 09/02/2013    IMPRESSION :  1. Mild obstructive sleep apnea with hypopneas causing sleep fragmentation and moderate oxygen desaturation.  2. Short runs of nonsustained  VT were noted. His beta blocker may need to be titrated 3. Significant PLM's were noted, the PLM arousal index was low. Please correlate with clinical history of restless leg syndrome.  4. Sleep efficiency was poor.  RECOMMENDATION:  1. Treatment options for this degree of sleep disordered breathing include weight loss and positional therapy to avoid supine sleep 2. Consider titrating beta blocker further, defer to cardiologist 3. Patient should be cautioned against driving when sleepy.They should be asked to avoid medications with sedative side effects     . Osteoarthritis of right hip   . Pneumonia    hx 30 yrs ago  . Primary osteoarthritis of right hip   . Rheumatoid arthritis ([place])   . Thrombocytopenia ([place])      Social History   Socioeconomic History  . Marital status: Single    Spouse name: Not on file  . Number of children: 3  . Years of education: Not on file  . Highest education level: Not on file  Social Needs  . Financial resource strain: Not on file  . Food insecurity - worry: Not on file  . Food insecurity - inability: Not on file  . Transportation needs - medical: Not on file  . Transportation needs - non-medical: Not on file  Occupational History  . Occupation: retired Personnel officer: RETIRED  Tobacco Use  . Smoking status: Never  Smoker  . Smokeless tobacco: Never Used  Substance and Sexual Activity  . Alcohol use: No    Comment: occasional  . Drug use: No  . Sexual activity: Not on file  Other Topics Concern  . Not on file  Social History Narrative   Former [employer]   Admitted to [facility] 11/25/15   Widowed   Never smoked   FULL CODE    Past Surgical History:  Procedure Laterality Date  . AV FISTULA PLACEMENT Left 08/26/2015   Procedure: LEFT RADIOCEPHALIC FISTULA CREATION;  Surgeon: Rosetta Posner, MD;  Location: [facility];  Service: Vascular;  Laterality: Left;  . AV FISTULA PLACEMENT Left 11/23/2015   Procedure:  ARTERIOVENOUS (AV) FISTULA CREATION;  Surgeon: Rosetta Posner, MD;  Location: [facility];  Service: Vascular;  Laterality: Left;  . BACK SURGERY     x2- discectomy  . CHOLECYSTECTOMY  1994  . CYSTOSCOPY/RETROGRADE/URETEROSCOPY/STONE EXTRACTION WITH BASKET    . ESOPHAGOGASTRODUODENOSCOPY N/A 11/13/2015   Procedure: ESOPHAGOGASTRODUODENOSCOPY (EGD);  Surgeon: Irene Shipper, MD;  Location: [facility] ENDOSCOPY;  Service: Endoscopy;  Laterality: N/A;  . INSERTION OF DIALYSIS CATHETER Left 11/23/2015   Procedure: INSERTION OF DIALYSIS CATHETER;  Surgeon: Rosetta Posner, MD;  Location: [facility];  Service: Vascular;  Laterality: Left;  . IR GENERIC HISTORICAL Left 08/04/2016   IR DIALY SHUNT INTRO NEEDLE/INTRACATH INITIAL W/IMG LEFT 08/04/2016 Markus Daft, MD [facility]  . IR GENERIC HISTORICAL  08/04/2016   IR US GUIDE VASC ACCESS LEFT 08/04/2016 Markus Daft, MD [facility]  . JOINT REPLACEMENT     Total L-Hip replacement, Right Knee 10/20/09  . LEFT AND RIGHT HEART CATHETERIZATION WITH CORONARY ANGIOGRAM N/A 02/22/2013   Procedure: LEFT AND RIGHT HEART CATHETERIZATION WITH CORONARY ANGIOGRAM;  Surgeon: Jolaine Artist, MD;  Location: [facility] CATH LAB;  Service: Cardiovascular;  Laterality: N/A;  . LITHOTRIPSY  90's  . PERIPHERAL VASCULAR CATHETERIZATION Left 11/19/2015   Procedure: A/V/Fistulagram;  Surgeon: Conrad [place], MD;  Location: [facility] CV LAB;  Service: Cardiovascular;  Laterality: Left;  . PERIPHERAL VASCULAR CATHETERIZATION Left 07/21/2016   Procedure: A/V Fistulagram;  Surgeon: Conrad [place], MD;  Location: [facility] CV LAB;  Service: Cardiovascular;  Laterality: Left;  arm  . REVISON OF ARTERIOVENOUS FISTULA Left 05/30/2016   Procedure: REVISION LEFT UPPER ARM FISTULA;  Surgeon: Conrad Sterling, MD;  Location: [facility];  Service: Vascular;  Laterality: Left;  . REVISON OF ARTERIOVENOUS FISTULA Left 07/22/2016   Procedure: REVISON OF BASILIC VEIN TRANSPOSITION ANASTOMOSIS;  Surgeon: Rosetta Posner, MD;  Location: [facility];   Service: Vascular;  Laterality: Left;  . SPINE SURGERY     x 2  . TOTAL HIP ARTHROPLASTY Right 03/22/2016   Procedure: RIGHT TOTAL HIP ARTHROPLASTY ANTERIOR APPROACH;  Surgeon: Mcarthur Rossetti, MD;  Location: [facility];  Service: Orthopedics;  Laterality: Right;    Family History  Problem Relation Age of Onset  . Hypertension Mother   . Arthritis Mother        ?RA  . Hypertension Father     Allergies  Allergen Reactions  . Ace Inhibitors Other (See Comments)    Worsening renal insufficiency    Current Outpatient Medications on File Prior to Visit  Medication Sig Dispense Refill  . acetaminophen (TYLENOL) 500 MG tablet Take 500 mg by mouth every 6 (six) hours as needed for mild pain.    [name] albuterol (PROVENTIL) (2.5 MG/3ML) 0.083% nebulizer solution Take 2.5  mg by nebulization every 4 (four) hours as needed for shortness of breath. Reported on 09/25/2015    . atorvastatin (LIPITOR) 10 MG tablet TAKE 1 TABLET(10 MG) BY MOUTH DAILY 90 tablet 0  . B Complex-C-Folic Acid (RENA-VITE RX) 1 MG TABS Take 1 tablet by mouth at bedtime.   6  . colchicine 0.6 MG tablet Take 1/2 tablet by mouth once as needed for gout flare. Call if no improvement 15 tablet 0  . fluticasone (FLONASE) 50 MCG/ACT nasal spray INSTILL 2 SPRAYS INTO BOTH NOSTRILS EVERY DAY 16 g 3  . gabapentin (NEURONTIN) 300 MG capsule TAKE 1 CAPSULE(300 MG) BY MOUTH AT BEDTIME 90 capsule 1  . hydroxychloroquine (PLAQUENIL) 200 MG tablet TAKE 1 TABLET(200 MG) BY MOUTH DAILY 90 tablet 1  . RENVELA 800 MG tablet Take 3 tablets (2,400 mg total) by mouth 3 (three) times daily with meals. 90 tablet 0  . warfarin (COUMADIN) 7.5 MG tablet TAKE ONE TABLET BY MOUTH DAILY AS DIRECTED BY COUMADIN CLINIC 100 tablet 0   No current facility-administered medications on file prior to visit.     BP 116/63 (BP Location: Right Arm, Patient Position: Sitting, Cuff Size: Large)   Pulse (!) 44   Temp 97.6 F (36.4 C) (Oral)   Resp 16   Ht 6\' 2"   (1.88 m)   Wt 273 lb (123.8 kg)   SpO2 99%   BMI 35.05 kg/m       Objective:   Physical Exam  Constitutional: He is oriented to person, place, and time. He appears well-developed and well-nourished. No distress.  HENT:  Head: Normocephalic and atraumatic.  Left Ear: Tympanic membrane and ear canal normal.  Nose: Rhinorrhea present. No mucosal edema.  Mouth/Throat: No oropharyngeal exudate, posterior oropharyngeal edema or posterior oropharyngeal erythema.  R TM occluded by cerumen  Cardiovascular: Normal rate and regular rhythm.  No murmur heard. Pulmonary/Chest: Effort normal and breath sounds normal. No respiratory distress. He has no wheezes. He has no rales.  Musculoskeletal: He exhibits no edema.  Neurological: He is alert and oriented to person, place, and time.  Skin: Skin is warm and dry.  Psychiatric: He has a normal mood and affect. His behavior is normal. Thought content normal.          Assessment & Plan:  Hypertension-blood pressure stable.  Continue amlodipine.  End-stage renal disease-continue hemodialysis.  Management per renal.  Hyperlipidemia-tolerating statin.  Obtain follow-up lipid panel.  Hyperglycemia-obtain follow-up A1c Lab Results  Component Value Date   HGBA1C 5.6 02/20/2017   Atrial fibrillation-he notes that he took more of the 7.5 mg tablets and he should have last week.  INR is 3.1 today.  He will drop back down to the full tablet of 7.5 three times weekly and half tab all other days.  Repeat INR in 1 week.  Allergic rhinitis-I think is residual postnasal drip at this time is related to more of an allergy component.  We will have him restart Flonase.  He is advised to call if symptoms worsen or they do not continue to improve.  Gout- stable, monitor.  Peripheral neuropathy- continues gabapentin.  Stable.  Rheumatoid arthritis- stable, management per rheumatology.

## 2017-10-03 NOTE — Patient Instructions (Addendum)
Continue current regimen with your coumadin and follow up in 1 week for coumadin check.  Start flonase, call if your nasal symptoms worsen or if they don't continue to improve.  Complete lab work prior to leaving.

## 2017-10-04 ENCOUNTER — Encounter: Payer: Self-pay | Admitting: Family

## 2017-10-04 DIAGNOSIS — I4891 Unspecified atrial fibrillation: Secondary | ICD-10-CM | POA: Diagnosis not present

## 2017-10-04 DIAGNOSIS — D509 Iron deficiency anemia, unspecified: Secondary | ICD-10-CM | POA: Diagnosis not present

## 2017-10-04 DIAGNOSIS — D631 Anemia in chronic kidney disease: Secondary | ICD-10-CM | POA: Diagnosis not present

## 2017-10-04 DIAGNOSIS — N186 End stage renal disease: Secondary | ICD-10-CM | POA: Diagnosis not present

## 2017-10-04 DIAGNOSIS — E8770 Fluid overload, unspecified: Secondary | ICD-10-CM | POA: Diagnosis not present

## 2017-10-04 DIAGNOSIS — N2581 Secondary hyperparathyroidism of renal origin: Secondary | ICD-10-CM | POA: Diagnosis not present

## 2017-10-06 DIAGNOSIS — I4891 Unspecified atrial fibrillation: Secondary | ICD-10-CM | POA: Diagnosis not present

## 2017-10-06 DIAGNOSIS — N186 End stage renal disease: Secondary | ICD-10-CM | POA: Diagnosis not present

## 2017-10-06 DIAGNOSIS — D631 Anemia in chronic kidney disease: Secondary | ICD-10-CM | POA: Diagnosis not present

## 2017-10-06 DIAGNOSIS — N2581 Secondary hyperparathyroidism of renal origin: Secondary | ICD-10-CM | POA: Diagnosis not present

## 2017-10-06 DIAGNOSIS — D509 Iron deficiency anemia, unspecified: Secondary | ICD-10-CM | POA: Diagnosis not present

## 2017-10-06 DIAGNOSIS — E8770 Fluid overload, unspecified: Secondary | ICD-10-CM | POA: Diagnosis not present

## 2017-10-09 DIAGNOSIS — D631 Anemia in chronic kidney disease: Secondary | ICD-10-CM | POA: Diagnosis not present

## 2017-10-09 DIAGNOSIS — N2581 Secondary hyperparathyroidism of renal origin: Secondary | ICD-10-CM | POA: Diagnosis not present

## 2017-10-09 DIAGNOSIS — D509 Iron deficiency anemia, unspecified: Secondary | ICD-10-CM | POA: Diagnosis not present

## 2017-10-09 DIAGNOSIS — N186 End stage renal disease: Secondary | ICD-10-CM | POA: Diagnosis not present

## 2017-10-09 DIAGNOSIS — I4891 Unspecified atrial fibrillation: Secondary | ICD-10-CM | POA: Diagnosis not present

## 2017-10-09 DIAGNOSIS — E8770 Fluid overload, unspecified: Secondary | ICD-10-CM | POA: Diagnosis not present

## 2017-10-10 ENCOUNTER — Ambulatory Visit: Payer: Medicare Other

## 2017-10-11 ENCOUNTER — Encounter: Payer: Self-pay | Admitting: Family

## 2017-10-11 DIAGNOSIS — D631 Anemia in chronic kidney disease: Secondary | ICD-10-CM | POA: Diagnosis not present

## 2017-10-11 DIAGNOSIS — N186 End stage renal disease: Secondary | ICD-10-CM | POA: Diagnosis not present

## 2017-10-11 DIAGNOSIS — I4891 Unspecified atrial fibrillation: Secondary | ICD-10-CM | POA: Diagnosis not present

## 2017-10-11 DIAGNOSIS — E8770 Fluid overload, unspecified: Secondary | ICD-10-CM | POA: Diagnosis not present

## 2017-10-11 DIAGNOSIS — D509 Iron deficiency anemia, unspecified: Secondary | ICD-10-CM | POA: Diagnosis not present

## 2017-10-11 DIAGNOSIS — N2581 Secondary hyperparathyroidism of renal origin: Secondary | ICD-10-CM | POA: Diagnosis not present

## 2017-10-12 LAB — PROTIME-INR

## 2017-10-13 DIAGNOSIS — E8770 Fluid overload, unspecified: Secondary | ICD-10-CM | POA: Diagnosis not present

## 2017-10-13 DIAGNOSIS — N2581 Secondary hyperparathyroidism of renal origin: Secondary | ICD-10-CM | POA: Diagnosis not present

## 2017-10-13 DIAGNOSIS — D631 Anemia in chronic kidney disease: Secondary | ICD-10-CM | POA: Diagnosis not present

## 2017-10-13 DIAGNOSIS — N186 End stage renal disease: Secondary | ICD-10-CM | POA: Diagnosis not present

## 2017-10-13 DIAGNOSIS — D509 Iron deficiency anemia, unspecified: Secondary | ICD-10-CM | POA: Diagnosis not present

## 2017-10-13 DIAGNOSIS — I4891 Unspecified atrial fibrillation: Secondary | ICD-10-CM | POA: Diagnosis not present

## 2017-10-16 DIAGNOSIS — D631 Anemia in chronic kidney disease: Secondary | ICD-10-CM | POA: Diagnosis not present

## 2017-10-16 DIAGNOSIS — E8770 Fluid overload, unspecified: Secondary | ICD-10-CM | POA: Diagnosis not present

## 2017-10-16 DIAGNOSIS — Z992 Dependence on renal dialysis: Secondary | ICD-10-CM | POA: Diagnosis not present

## 2017-10-16 DIAGNOSIS — N2581 Secondary hyperparathyroidism of renal origin: Secondary | ICD-10-CM | POA: Diagnosis not present

## 2017-10-16 DIAGNOSIS — N186 End stage renal disease: Secondary | ICD-10-CM | POA: Diagnosis not present

## 2017-10-16 DIAGNOSIS — I158 Other secondary hypertension: Secondary | ICD-10-CM | POA: Diagnosis not present

## 2017-10-16 DIAGNOSIS — I4891 Unspecified atrial fibrillation: Secondary | ICD-10-CM | POA: Diagnosis not present

## 2017-10-18 DIAGNOSIS — D631 Anemia in chronic kidney disease: Secondary | ICD-10-CM | POA: Diagnosis not present

## 2017-10-18 DIAGNOSIS — N2581 Secondary hyperparathyroidism of renal origin: Secondary | ICD-10-CM | POA: Diagnosis not present

## 2017-10-18 DIAGNOSIS — E8770 Fluid overload, unspecified: Secondary | ICD-10-CM | POA: Diagnosis not present

## 2017-10-18 DIAGNOSIS — I4891 Unspecified atrial fibrillation: Secondary | ICD-10-CM | POA: Diagnosis not present

## 2017-10-18 DIAGNOSIS — N186 End stage renal disease: Secondary | ICD-10-CM | POA: Diagnosis not present

## 2017-10-19 DIAGNOSIS — N186 End stage renal disease: Secondary | ICD-10-CM | POA: Diagnosis not present

## 2017-10-19 DIAGNOSIS — E8770 Fluid overload, unspecified: Secondary | ICD-10-CM | POA: Diagnosis not present

## 2017-10-19 DIAGNOSIS — N2581 Secondary hyperparathyroidism of renal origin: Secondary | ICD-10-CM | POA: Diagnosis not present

## 2017-10-19 DIAGNOSIS — D631 Anemia in chronic kidney disease: Secondary | ICD-10-CM | POA: Diagnosis not present

## 2017-10-19 DIAGNOSIS — I4891 Unspecified atrial fibrillation: Secondary | ICD-10-CM | POA: Diagnosis not present

## 2017-10-20 ENCOUNTER — Telehealth: Payer: Self-pay | Admitting: Family

## 2017-10-20 DIAGNOSIS — E8770 Fluid overload, unspecified: Secondary | ICD-10-CM | POA: Diagnosis not present

## 2017-10-20 DIAGNOSIS — D631 Anemia in chronic kidney disease: Secondary | ICD-10-CM | POA: Diagnosis not present

## 2017-10-20 DIAGNOSIS — N186 End stage renal disease: Secondary | ICD-10-CM | POA: Diagnosis not present

## 2017-10-20 DIAGNOSIS — I4891 Unspecified atrial fibrillation: Secondary | ICD-10-CM | POA: Diagnosis not present

## 2017-10-20 DIAGNOSIS — N2581 Secondary hyperparathyroidism of renal origin: Secondary | ICD-10-CM | POA: Diagnosis not present

## 2017-10-20 MED ORDER — WARFARIN SODIUM 7.5 MG PO TABS: ORAL_TABLET | ORAL | 0 refills | Status: DC

## 2017-10-20 NOTE — Telephone Encounter (Signed)
Please advise 

## 2017-10-20 NOTE — Telephone Encounter (Signed)
Copied from [place]. Topic: Quick Communication - Rx Refill/Question >> Oct 20, 2017 12:24 PM Selinda Flavin B, NT wrote: Medication: warfarin (COUMADIN) 7.5 MG tablet  Patient calling in today stating he is completely out of medication.  Has the patient contacted their pharmacy? Yes.   (Agent: If no, request that the patient contact the pharmacy for the refill.) Preferred Pharmacy (with phone number or street name): [id number] - [place], [address] AT [address] Agent: Please be advised that RX refills may take up to 3 business days. We ask that you follow-up with your pharmacy.

## 2017-10-20 NOTE — Telephone Encounter (Signed)
Refill sent, patient notified. He will schedule INR check on Monday.

## 2017-10-20 NOTE — Telephone Encounter (Signed)
Coumadin LOV: 10/03/17 PCP: Debbrah Alar [facility] with [place] and confirmed with no INR completed or office visit after 3/191/9

## 2017-10-23 DIAGNOSIS — N186 End stage renal disease: Secondary | ICD-10-CM | POA: Diagnosis not present

## 2017-10-23 DIAGNOSIS — D631 Anemia in chronic kidney disease: Secondary | ICD-10-CM | POA: Diagnosis not present

## 2017-10-23 DIAGNOSIS — N2581 Secondary hyperparathyroidism of renal origin: Secondary | ICD-10-CM | POA: Diagnosis not present

## 2017-10-23 DIAGNOSIS — E8770 Fluid overload, unspecified: Secondary | ICD-10-CM | POA: Diagnosis not present

## 2017-10-23 DIAGNOSIS — I4891 Unspecified atrial fibrillation: Secondary | ICD-10-CM | POA: Diagnosis not present

## 2017-10-24 ENCOUNTER — Telehealth: Payer: Self-pay | Admitting: Family

## 2017-10-24 NOTE — Telephone Encounter (Signed)
Please contact pt to schedule INR check this week.

## 2017-10-24 NOTE — Telephone Encounter (Signed)
Called pt and LVM advising him to call and schedule an INR nurse visit for sometime this week.

## 2017-10-25 DIAGNOSIS — N2581 Secondary hyperparathyroidism of renal origin: Secondary | ICD-10-CM | POA: Diagnosis not present

## 2017-10-25 DIAGNOSIS — I4891 Unspecified atrial fibrillation: Secondary | ICD-10-CM | POA: Diagnosis not present

## 2017-10-25 DIAGNOSIS — E8770 Fluid overload, unspecified: Secondary | ICD-10-CM | POA: Diagnosis not present

## 2017-10-25 DIAGNOSIS — N186 End stage renal disease: Secondary | ICD-10-CM | POA: Diagnosis not present

## 2017-10-25 DIAGNOSIS — D631 Anemia in chronic kidney disease: Secondary | ICD-10-CM | POA: Diagnosis not present

## 2017-10-27 DIAGNOSIS — N186 End stage renal disease: Secondary | ICD-10-CM | POA: Diagnosis not present

## 2017-10-27 DIAGNOSIS — D631 Anemia in chronic kidney disease: Secondary | ICD-10-CM | POA: Diagnosis not present

## 2017-10-27 DIAGNOSIS — E8770 Fluid overload, unspecified: Secondary | ICD-10-CM | POA: Diagnosis not present

## 2017-10-27 DIAGNOSIS — I4891 Unspecified atrial fibrillation: Secondary | ICD-10-CM | POA: Diagnosis not present

## 2017-10-27 DIAGNOSIS — N2581 Secondary hyperparathyroidism of renal origin: Secondary | ICD-10-CM | POA: Diagnosis not present

## 2017-10-30 DIAGNOSIS — N2581 Secondary hyperparathyroidism of renal origin: Secondary | ICD-10-CM | POA: Diagnosis not present

## 2017-10-30 DIAGNOSIS — I4891 Unspecified atrial fibrillation: Secondary | ICD-10-CM | POA: Diagnosis not present

## 2017-10-30 DIAGNOSIS — D631 Anemia in chronic kidney disease: Secondary | ICD-10-CM | POA: Diagnosis not present

## 2017-10-30 DIAGNOSIS — E8770 Fluid overload, unspecified: Secondary | ICD-10-CM | POA: Diagnosis not present

## 2017-10-30 DIAGNOSIS — N186 End stage renal disease: Secondary | ICD-10-CM | POA: Diagnosis not present

## 2017-11-01 DIAGNOSIS — E8770 Fluid overload, unspecified: Secondary | ICD-10-CM | POA: Diagnosis not present

## 2017-11-01 DIAGNOSIS — N2581 Secondary hyperparathyroidism of renal origin: Secondary | ICD-10-CM | POA: Diagnosis not present

## 2017-11-01 DIAGNOSIS — D631 Anemia in chronic kidney disease: Secondary | ICD-10-CM | POA: Diagnosis not present

## 2017-11-01 DIAGNOSIS — I4891 Unspecified atrial fibrillation: Secondary | ICD-10-CM | POA: Diagnosis not present

## 2017-11-01 DIAGNOSIS — N186 End stage renal disease: Secondary | ICD-10-CM | POA: Diagnosis not present

## 2017-11-03 DIAGNOSIS — D631 Anemia in chronic kidney disease: Secondary | ICD-10-CM | POA: Diagnosis not present

## 2017-11-03 DIAGNOSIS — N2581 Secondary hyperparathyroidism of renal origin: Secondary | ICD-10-CM | POA: Diagnosis not present

## 2017-11-03 DIAGNOSIS — E8770 Fluid overload, unspecified: Secondary | ICD-10-CM | POA: Diagnosis not present

## 2017-11-03 DIAGNOSIS — I4891 Unspecified atrial fibrillation: Secondary | ICD-10-CM | POA: Diagnosis not present

## 2017-11-03 DIAGNOSIS — N186 End stage renal disease: Secondary | ICD-10-CM | POA: Diagnosis not present

## 2017-11-04 NOTE — Telephone Encounter (Signed)
Patient is overdue for INR. Please contact pt to schedule INR visit early this week.

## 2017-11-06 DIAGNOSIS — N2581 Secondary hyperparathyroidism of renal origin: Secondary | ICD-10-CM | POA: Diagnosis not present

## 2017-11-06 DIAGNOSIS — I4891 Unspecified atrial fibrillation: Secondary | ICD-10-CM | POA: Diagnosis not present

## 2017-11-06 DIAGNOSIS — E8770 Fluid overload, unspecified: Secondary | ICD-10-CM | POA: Diagnosis not present

## 2017-11-06 DIAGNOSIS — D631 Anemia in chronic kidney disease: Secondary | ICD-10-CM | POA: Diagnosis not present

## 2017-11-06 DIAGNOSIS — N186 End stage renal disease: Secondary | ICD-10-CM | POA: Diagnosis not present

## 2017-11-06 NOTE — Telephone Encounter (Signed)
Attempted to reach pt. Left detailed message on home voicemail of need for nurse visit ASAP this week. Scheduled 1st available for Wednesday at 10:30am and left message for pt to call if that date/time will not work.

## 2017-11-07 ENCOUNTER — Ambulatory Visit: Payer: Medicare Other

## 2017-11-07 ENCOUNTER — Telehealth: Payer: Self-pay | Admitting: *Deleted

## 2017-11-07 ENCOUNTER — Ambulatory Visit (INDEPENDENT_AMBULATORY_CARE_PROVIDER_SITE_OTHER): Payer: Medicare Other | Admitting: *Deleted

## 2017-11-07 DIAGNOSIS — I4891 Unspecified atrial fibrillation: Secondary | ICD-10-CM | POA: Diagnosis not present

## 2017-11-07 NOTE — Telephone Encounter (Signed)
  Spoke with pt and scheduled nurse visit for today at 2pm  Copied from [place]. Topic: Appointment Scheduling - Scheduling Inquiry for Clinic >> Nov 06, 2017  5:15 PM Arletha Grippe wrote: Reason for CRM: pt can not come to his inr appt on wed, he has dialysis.  He is asking for either tues or thurs.  Please call to let pt know if he can be worked in.  Cb is [phone] or [phone]

## 2017-11-07 NOTE — Progress Notes (Signed)
Pre visit review using our clinic review tool, if applicable. No additional management support is needed unless otherwise documented below in the visit note.  Pt here for INR check per Debbrah Alar, NP.  Goal INR = 2.0-3.0  Last INR = 3.1 (10/03/17)  Pt currently taking Coumadin 7.5mg  on Mon, Wed, Frid and 1/2 tablet (3.25mg ) on all other days. (total mg / wk = 35.5)  Pt denies dietary changes, no recent antibiotics and no unusual bruising / bleeding.  INR today = 2.0  Advised pt to continue current Coumadin dose and return in 2 weeks for INR check. Appointment scheduled for 11/21/17 at 9:30am.

## 2017-11-07 NOTE — Progress Notes (Signed)
Noted and agree.  Nance Pear NP

## 2017-11-08 ENCOUNTER — Ambulatory Visit: Payer: Medicare Other

## 2017-11-08 DIAGNOSIS — E8770 Fluid overload, unspecified: Secondary | ICD-10-CM | POA: Diagnosis not present

## 2017-11-08 DIAGNOSIS — N186 End stage renal disease: Secondary | ICD-10-CM | POA: Diagnosis not present

## 2017-11-08 DIAGNOSIS — D631 Anemia in chronic kidney disease: Secondary | ICD-10-CM | POA: Diagnosis not present

## 2017-11-08 DIAGNOSIS — N2581 Secondary hyperparathyroidism of renal origin: Secondary | ICD-10-CM | POA: Diagnosis not present

## 2017-11-08 DIAGNOSIS — I4891 Unspecified atrial fibrillation: Secondary | ICD-10-CM | POA: Diagnosis not present

## 2017-11-08 LAB — POCT INR: INR: 2

## 2017-11-10 DIAGNOSIS — E8770 Fluid overload, unspecified: Secondary | ICD-10-CM | POA: Diagnosis not present

## 2017-11-10 DIAGNOSIS — D631 Anemia in chronic kidney disease: Secondary | ICD-10-CM | POA: Diagnosis not present

## 2017-11-10 DIAGNOSIS — I4891 Unspecified atrial fibrillation: Secondary | ICD-10-CM | POA: Diagnosis not present

## 2017-11-10 DIAGNOSIS — N2581 Secondary hyperparathyroidism of renal origin: Secondary | ICD-10-CM | POA: Diagnosis not present

## 2017-11-10 DIAGNOSIS — N186 End stage renal disease: Secondary | ICD-10-CM | POA: Diagnosis not present

## 2017-11-13 DIAGNOSIS — I4891 Unspecified atrial fibrillation: Secondary | ICD-10-CM | POA: Diagnosis not present

## 2017-11-13 DIAGNOSIS — N186 End stage renal disease: Secondary | ICD-10-CM | POA: Diagnosis not present

## 2017-11-13 DIAGNOSIS — N2581 Secondary hyperparathyroidism of renal origin: Secondary | ICD-10-CM | POA: Diagnosis not present

## 2017-11-13 DIAGNOSIS — D631 Anemia in chronic kidney disease: Secondary | ICD-10-CM | POA: Diagnosis not present

## 2017-11-13 DIAGNOSIS — E8770 Fluid overload, unspecified: Secondary | ICD-10-CM | POA: Diagnosis not present

## 2017-11-15 ENCOUNTER — Telehealth: Payer: Self-pay | Admitting: Family

## 2017-11-15 ENCOUNTER — Ambulatory Visit: Payer: Medicare Other | Admitting: *Deleted

## 2017-11-15 DIAGNOSIS — E8770 Fluid overload, unspecified: Secondary | ICD-10-CM | POA: Diagnosis not present

## 2017-11-15 DIAGNOSIS — I4891 Unspecified atrial fibrillation: Secondary | ICD-10-CM | POA: Diagnosis not present

## 2017-11-15 DIAGNOSIS — N186 End stage renal disease: Secondary | ICD-10-CM | POA: Diagnosis not present

## 2017-11-15 DIAGNOSIS — N2581 Secondary hyperparathyroidism of renal origin: Secondary | ICD-10-CM | POA: Diagnosis not present

## 2017-11-15 DIAGNOSIS — D509 Iron deficiency anemia, unspecified: Secondary | ICD-10-CM | POA: Diagnosis not present

## 2017-11-15 DIAGNOSIS — D631 Anemia in chronic kidney disease: Secondary | ICD-10-CM | POA: Diagnosis not present

## 2017-11-15 DIAGNOSIS — Z992 Dependence on renal dialysis: Secondary | ICD-10-CM | POA: Diagnosis not present

## 2017-11-15 DIAGNOSIS — I158 Other secondary hypertension: Secondary | ICD-10-CM | POA: Diagnosis not present

## 2017-11-15 NOTE — Telephone Encounter (Signed)
Please let pt knwo last coumadin check was OK.  Needs to be rechecked around 5/25. This can be done at dialysis if his nephrologist is willing.  If not he will need to see [place] for INR at that time. I did send a note to his nephrologist to check with her on this.

## 2017-11-16 ENCOUNTER — Telehealth: Payer: Self-pay | Admitting: Family

## 2017-11-16 NOTE — Telephone Encounter (Signed)
Bruce Cole,  Could you please make sure he has an INR drawn around 5/23 and faxed to [place]?  Let's keep that number on hand please.

## 2017-11-16 NOTE — Telephone Encounter (Signed)
-----   Message from Corliss Parish, MD sent at 11/16/2017  3:48 PM EDT ----- Hi, thanks for reaching out.  We can certainly draw the INR at dialysis but then what we always do is fax results to whoever follows it for them to make changes.  You can request when they need a next level drawn.  The phone number for the HD unit is [phone] and FAX is [phone]   ----- Message ----- From: Debbrah Alar, NP Sent: 11/15/2017   1:15 PM To: Corliss Parish, MD  Hello,  I just wanted to verify that he is having a standing INR drawn at hemodialysis. If so, I will stop having him come to our office.  If so, will you be managing coumadin adjustments?  I received an INR result on him so I just wanted to check.   Thanks,  [employer]

## 2017-11-16 NOTE — Progress Notes (Deleted)
Subjective:   Bruce Cole is a 77 y.o. male who presents for Medicare Annual/Subsequent preventive examination.  Review of Systems: No ROS.  Medicare Wellness Visit. Additional risk factors are reflected in the social history.    Sleep patterns: Home Safety/Smoke Alarms: Feels safe in home. Smoke alarms in place.  Living environment; residence and Firearm Safety:   Male:   CCS- due 2021     PSA-  Lab Results  Component Value Date   PSA 0.58 01/30/2012   PSA 0.43 07/24/2009       Objective:    Vitals: There were no vitals taken for this visit.  There is no height or weight on file to calculate BMI.  Advanced Directives 08/01/2016 08/01/2016 07/16/2016 07/16/2016 07/16/2016 05/25/2016 03/11/2016  Does Patient Have a Medical Advance Directive? No No - No No Yes No  Type of Advance Directive - - - - - Press photographer;Living will -  Does patient want to make changes to medical advance directive? - - - - - No - Patient declined -  Copy of [place] in Chart? - - - - - No - copy requested -  Would patient like information on creating a medical advance directive? No - Patient declined No - Patient declined Yes (Inpatient - patient defers creating a medical advance directive at this time) - - - No - patient declined information    Tobacco Social History   Tobacco Use  Smoking Status Never Smoker  Smokeless Tobacco Never Used     Counseling given: Not Answered   Clinical Intake:                       Past Medical History:  Diagnosis Date  . Acute blood loss anemia   . Atrial fibrillation (HCC)    Chronic  . Chronic combined systolic and diastolic heart failure ([place])   . Colon polyp 2000  . Dysrhythmia    hx  . ESRD on hemodialysis ([place])    [name]  . Essential hypertension   . Gastritis and gastroduodenitis   . GI bleed   . Gout   . Heart murmur   . History of blood transfusion   . History of kidney  stones   . Nephrolithiasis   . OSA (obstructive sleep apnea) 09/02/2013    IMPRESSION :  1. Mild obstructive sleep apnea with hypopneas causing sleep fragmentation and moderate oxygen desaturation.  2. Short runs of nonsustained VT were noted. His beta blocker may need to be titrated 3. Significant PLM's were noted, the PLM arousal index was low. Please correlate with clinical history of restless leg syndrome.  4. Sleep efficiency was poor.  RECOMMENDATION:  1. Treatment options for this degree of sleep disordered breathing include weight loss and positional therapy to avoid supine sleep 2. Consider titrating beta blocker further, defer to cardiologist 3. Patient should be cautioned against driving when sleepy.They should be asked to avoid medications with sedative side effects     . Osteoarthritis of right hip   . Pneumonia    hx 30 yrs ago  . Primary osteoarthritis of right hip   . Rheumatoid arthritis ([place])   . Thrombocytopenia ([place])    Past Surgical History:  Procedure Laterality Date  . AV FISTULA PLACEMENT Left 08/26/2015   Procedure: LEFT RADIOCEPHALIC FISTULA CREATION;  Surgeon: Rosetta Posner, MD;  Location: [place];  Service: Vascular;  Laterality: Left;  .  AV FISTULA PLACEMENT Left 11/23/2015   Procedure: ARTERIOVENOUS (AV) FISTULA CREATION;  Surgeon: Rosetta Posner, MD;  Location: [place];  Service: Vascular;  Laterality: Left;  . BACK SURGERY     x2- discectomy  . CHOLECYSTECTOMY  1994  . CYSTOSCOPY/RETROGRADE/URETEROSCOPY/STONE EXTRACTION WITH BASKET    . ESOPHAGOGASTRODUODENOSCOPY N/A 11/13/2015   Procedure: ESOPHAGOGASTRODUODENOSCOPY (EGD);  Surgeon: Irene Shipper, MD;  Location: [facility] ENDOSCOPY;  Service: Endoscopy;  Laterality: N/A;  . INSERTION OF DIALYSIS CATHETER Left 11/23/2015   Procedure: INSERTION OF DIALYSIS CATHETER;  Surgeon: Rosetta Posner, MD;  Location: [place];  Service: Vascular;  Laterality: Left;  . IR GENERIC HISTORICAL Left 08/04/2016   IR DIALY SHUNT INTRO NEEDLE/INTRACATH  INITIAL W/IMG LEFT 08/04/2016 Markus Daft, MD [facility]-INTERV RAD  . IR GENERIC HISTORICAL  08/04/2016   IR US GUIDE VASC ACCESS LEFT 08/04/2016 Markus Daft, MD [facility]  . JOINT REPLACEMENT     Total L-Hip replacement, Right Knee 10/20/09  . LEFT AND RIGHT HEART CATHETERIZATION WITH CORONARY ANGIOGRAM N/A 02/22/2013   Procedure: LEFT AND RIGHT HEART CATHETERIZATION WITH CORONARY ANGIOGRAM;  Surgeon: Jolaine Artist, MD;  Location: [facility] CATH LAB;  Service: Cardiovascular;  Laterality: N/A;  . LITHOTRIPSY  90's  . PERIPHERAL VASCULAR CATHETERIZATION Left 11/19/2015   Procedure: A/V/Fistulagram;  Surgeon: Conrad Hope, MD;  Location: [place] CV LAB;  Service: Cardiovascular;  Laterality: Left;  . PERIPHERAL VASCULAR CATHETERIZATION Left 07/21/2016   Procedure: A/V Fistulagram;  Surgeon: Conrad [place], MD;  Location: [place] CV LAB;  Service: Cardiovascular;  Laterality: Left;  arm  . REVISON OF ARTERIOVENOUS FISTULA Left 05/30/2016   Procedure: REVISION LEFT UPPER ARM FISTULA;  Surgeon: Conrad [place], MD;  Location: [place];  Service: Vascular;  Laterality: Left;  . REVISON OF ARTERIOVENOUS FISTULA Left 07/22/2016   Procedure: REVISON OF BASILIC VEIN TRANSPOSITION ANASTOMOSIS;  Surgeon: Rosetta Posner, MD;  Location: [place];  Service: Vascular;  Laterality: Left;  . SPINE SURGERY     x 2  . TOTAL HIP ARTHROPLASTY Right 03/22/2016   Procedure: RIGHT TOTAL HIP ARTHROPLASTY ANTERIOR APPROACH;  Surgeon: Mcarthur Rossetti, MD;  Location: [place];  Service: Orthopedics;  Laterality: Right;   Family History  Problem Relation Age of Onset  . Hypertension Mother   . Arthritis Mother        ?RA  . Hypertension Father    Social History   Socioeconomic History  . Marital status: Single    Spouse name: Not on file  . Number of children: 3  . Years of education: Not on file  . Highest education level: Not on file  Occupational History  . Occupation: retired Personnel officer: RETIRED  Social  Needs  . Financial resource strain: Not on file  . Food insecurity:    Worry: Not on file    Inability: Not on file  . Transportation needs:    Medical: Not on file    Non-medical: Not on file  Tobacco Use  . Smoking status: Never Smoker  . Smokeless tobacco: Never Used  Substance and Sexual Activity  . Alcohol use: No    Comment: occasional  . Drug use: No  . Sexual activity: Not on file  Lifestyle  . Physical activity:    Days per week: Not on file    Minutes per session: Not on file  . Stress: Not on file  Relationships  . Social connections:    Talks  on phone: Not on file    Gets together: Not on file    Attends religious service: Not on file    Active member of club or organization: Not on file    Attends meetings of clubs or organizations: Not on file    Relationship status: Not on file  Other Topics Concern  . Not on file  Social History Narrative   Former [employer] and [place]   Admitted to [facility] 11/25/15   Widowed   Never smoked   FULL CODE    Outpatient Encounter Medications as of 11/21/2017  Medication Sig  . acetaminophen (TYLENOL) 500 MG tablet Take 500 mg by mouth every 6 (six) hours as needed for mild pain.  [name] albuterol (PROVENTIL) (2.5 MG/3ML) 0.083% nebulizer solution Take 2.5 mg by nebulization every 4 (four) hours as needed for shortness of breath. Reported on 09/25/2015  . atorvastatin (LIPITOR) 10 MG tablet TAKE 1 TABLET(10 MG) BY MOUTH DAILY  . B Complex-C-Folic Acid (RENA-VITE RX) 1 MG TABS Take 1 tablet by mouth at bedtime.   . colchicine 0.6 MG tablet Take 1/2 tablet by mouth once as needed for gout flare. Call if no improvement  . fluticasone (FLONASE) 50 MCG/ACT nasal spray INSTILL 2 SPRAYS INTO BOTH NOSTRILS EVERY DAY  . furosemide (LASIX) 40 MG tablet TAKE 1 TABLET(40 MG) BY MOUTH TWICE DAILY  . gabapentin (NEURONTIN) 300 MG capsule TAKE 1 CAPSULE(300 MG) BY MOUTH AT BEDTIME  . hydroxychloroquine (PLAQUENIL) 200 MG tablet  TAKE 1 TABLET(200 MG) BY MOUTH DAILY  . RENVELA 800 MG tablet Take 3 tablets (2,400 mg total) by mouth 3 (three) times daily with meals.  . warfarin (COUMADIN) 7.5 MG tablet TAKE ONE TABLET BY MOUTH DAILY AS DIRECTED BY COUMADIN CLINIC   No facility-administered encounter medications on file as of 11/21/2017.     Activities of Daily Living No flowsheet data found.  Patient Care Team: Debbrah Alar, NP as PCP - General (Internal Medicine) Estanislado Emms, MD as Consulting Physician (Nephrology) Mcarthur Rossetti, MD as Consulting Physician (Orthopedic Surgery) Bensimhon, Shaune Pascal, MD as Consulting Physician (Cardiology) [place]   Assessment:   This is a routine wellness examination for Bruce Cole. Physical assessment deferred to PCP.   Exercise Activities and Dietary recommendations   Diet (meal preparation, eat out, water intake, caffeinated beverages, dairy products, fruits and vegetables): {Desc; diets:16563} Breakfast: Lunch:  Dinner:      Goals    None      Fall Risk Fall Risk  07/04/2017 06/22/2017 06/17/2016 12/03/2015 10/28/2015  Falls in the past year? No No No Yes No  Comment - Emmi Telephone Survey: data to providers prior to load - - -  Number falls in past yr: - - - 2 or more -  Injury with Fall? - - - Yes -  Risk Factor Category  - - - High Fall Risk -  Risk for fall due to : - - - History of fall(s);Impaired balance/gait;Impaired mobility -  Follow up - - - Education provided;Falls prevention discussed -     Depression Screen PHQ 2/9 Scores 07/04/2017 06/17/2016 12/03/2015 10/28/2015  PHQ - 2 Score 1 0 0 0  PHQ- 9 Score 6 - - -    Cognitive Function        Immunization History  Administered Date(s) Administered  . Influenza Split 05/07/2012  . Influenza, High Dose Seasonal PF 06/03/2013  . Influenza,inj,Quad PF,6+ Mos 03/17/2014, 05/05/2015  . Influenza-Unspecified  05/20/2016, 04/17/2017  . PPD Test 11/25/2015  .  Pneumococcal Conjugate-13 12/04/2013  . Pneumococcal Polysaccharide-23 09/20/2010  . Tdap 07/19/2011     Screening Tests Health Maintenance  Topic Date Due  . URINE MICROALBUMIN  12/16/2015  . OPHTHALMOLOGY EXAM  09/14/2017  . INFLUENZA VACCINE  02/15/2018  . FOOT EXAM  02/20/2018  . HEMOGLOBIN A1C  04/05/2018  . COLONOSCOPY  09/08/2019  . TETANUS/TDAP  07/18/2021  . PNA vac Low Risk Adult  Completed        Plan:   ***  I have personally reviewed and noted the following in the patient's chart:   . Medical and social history . Use of alcohol, tobacco or illicit drugs  . Current medications and supplements . Functional ability and status . Nutritional status . Physical activity . Advanced directives . List of other physicians . Hospitalizations, surgeries, and ER visits in previous 12 months . Vitals . Screenings to include cognitive, depression, and falls . Referrals and appointments  In addition, I have reviewed and discussed with patient certain preventive protocols, quality metrics, and best practice recommendations. A written personalized care plan for preventive services as well as general preventive health recommendations were provided to patient.     Shela Nevin, [place]  11/16/2017

## 2017-11-17 DIAGNOSIS — I4891 Unspecified atrial fibrillation: Secondary | ICD-10-CM | POA: Diagnosis not present

## 2017-11-17 DIAGNOSIS — D509 Iron deficiency anemia, unspecified: Secondary | ICD-10-CM | POA: Diagnosis not present

## 2017-11-17 DIAGNOSIS — N2581 Secondary hyperparathyroidism of renal origin: Secondary | ICD-10-CM | POA: Diagnosis not present

## 2017-11-17 DIAGNOSIS — D631 Anemia in chronic kidney disease: Secondary | ICD-10-CM | POA: Diagnosis not present

## 2017-11-17 DIAGNOSIS — E8770 Fluid overload, unspecified: Secondary | ICD-10-CM | POA: Diagnosis not present

## 2017-11-17 DIAGNOSIS — N186 End stage renal disease: Secondary | ICD-10-CM | POA: Diagnosis not present

## 2017-11-17 NOTE — Telephone Encounter (Signed)
See 11/16/17 phone note.

## 2017-11-17 NOTE — Telephone Encounter (Signed)
Faxed request to do INR on or around 12/07/17 and fax to our nurses station to my attention.

## 2017-11-18 ENCOUNTER — Other Ambulatory Visit: Payer: Self-pay | Admitting: Family

## 2017-11-20 DIAGNOSIS — N186 End stage renal disease: Secondary | ICD-10-CM | POA: Diagnosis not present

## 2017-11-20 DIAGNOSIS — E8770 Fluid overload, unspecified: Secondary | ICD-10-CM | POA: Diagnosis not present

## 2017-11-20 DIAGNOSIS — D631 Anemia in chronic kidney disease: Secondary | ICD-10-CM | POA: Diagnosis not present

## 2017-11-20 DIAGNOSIS — D509 Iron deficiency anemia, unspecified: Secondary | ICD-10-CM | POA: Diagnosis not present

## 2017-11-20 DIAGNOSIS — I4891 Unspecified atrial fibrillation: Secondary | ICD-10-CM | POA: Diagnosis not present

## 2017-11-20 DIAGNOSIS — N2581 Secondary hyperparathyroidism of renal origin: Secondary | ICD-10-CM | POA: Diagnosis not present

## 2017-11-21 ENCOUNTER — Ambulatory Visit: Payer: Medicare Other | Admitting: *Deleted

## 2017-11-21 ENCOUNTER — Ambulatory Visit: Payer: Medicare Other

## 2017-11-21 DIAGNOSIS — N186 End stage renal disease: Secondary | ICD-10-CM | POA: Diagnosis not present

## 2017-11-21 DIAGNOSIS — E8770 Fluid overload, unspecified: Secondary | ICD-10-CM | POA: Diagnosis not present

## 2017-11-21 DIAGNOSIS — N2581 Secondary hyperparathyroidism of renal origin: Secondary | ICD-10-CM | POA: Diagnosis not present

## 2017-11-22 DIAGNOSIS — N186 End stage renal disease: Secondary | ICD-10-CM | POA: Diagnosis not present

## 2017-11-22 DIAGNOSIS — I4891 Unspecified atrial fibrillation: Secondary | ICD-10-CM | POA: Diagnosis not present

## 2017-11-22 DIAGNOSIS — D509 Iron deficiency anemia, unspecified: Secondary | ICD-10-CM | POA: Diagnosis not present

## 2017-11-22 DIAGNOSIS — N2581 Secondary hyperparathyroidism of renal origin: Secondary | ICD-10-CM | POA: Diagnosis not present

## 2017-11-22 DIAGNOSIS — D631 Anemia in chronic kidney disease: Secondary | ICD-10-CM | POA: Diagnosis not present

## 2017-11-22 DIAGNOSIS — E8770 Fluid overload, unspecified: Secondary | ICD-10-CM | POA: Diagnosis not present

## 2017-11-22 NOTE — Progress Notes (Deleted)
Subjective:   Bruce Cole is a 77 y.o. male who presents for Medicare Annual/Subsequent preventive examination.  Review of Systems: No ROS.  Medicare Wellness Visit. Additional risk factors are reflected in the social history.    Sleep patterns: Home Safety/Smoke Alarms: Feels safe in home. Smoke alarms in place.  Living environment; residence and Firearm Safety:   Male:   CCS-      PSA-  Lab Results  Component Value Date   PSA 0.58 01/30/2012   PSA 0.43 07/24/2009       Objective:    Vitals: There were no vitals taken for this visit.  There is no height or weight on file to calculate BMI.  Advanced Directives 08/01/2016 08/01/2016 07/16/2016 07/16/2016 07/16/2016 05/25/2016 03/11/2016  Does Patient Have a Medical Advance Directive? No No - No No Yes No  Type of Advance Directive - - - - - Press photographer;Living will -  Does patient want to make changes to medical advance directive? - - - - - No - Patient declined -  Copy of [place] in Chart? - - - - - No - copy requested -  Would patient like information on creating a medical advance directive? No - Patient declined No - Patient declined Yes (Inpatient - patient defers creating a medical advance directive at this time) - - - No - patient declined information    Tobacco Social History   Tobacco Use  Smoking Status Never Smoker  Smokeless Tobacco Never Used     Counseling given: Not Answered   Clinical Intake:                       Past Medical History:  Diagnosis Date  . Acute blood loss anemia   . Atrial fibrillation (HCC)    Chronic  . Chronic combined systolic and diastolic heart failure ([place])   . Colon polyp 2000  . Dysrhythmia    hx  . ESRD on hemodialysis ([place])    [place]  . Essential hypertension   . Gastritis and gastroduodenitis   . GI bleed   . Gout   . Heart murmur   . History of blood transfusion   . History of kidney stones     . Nephrolithiasis   . OSA (obstructive sleep apnea) 09/02/2013    IMPRESSION :  1. Mild obstructive sleep apnea with hypopneas causing sleep fragmentation and moderate oxygen desaturation.  2. Short runs of nonsustained VT were noted. His beta blocker may need to be titrated 3. Significant PLM's were noted, the PLM arousal index was low. Please correlate with clinical history of restless leg syndrome.  4. Sleep efficiency was poor.  RECOMMENDATION:  1. Treatment options for this degree of sleep disordered breathing include weight loss and positional therapy to avoid supine sleep 2. Consider titrating beta blocker further, defer to cardiologist 3. Patient should be cautioned against driving when sleepy.They should be asked to avoid medications with sedative side effects     . Osteoarthritis of right hip   . Pneumonia    hx 30 yrs ago  . Primary osteoarthritis of right hip   . Rheumatoid arthritis ([place])   . Thrombocytopenia ([place])    Past Surgical History:  Procedure Laterality Date  . AV FISTULA PLACEMENT Left 08/26/2015   Procedure: LEFT RADIOCEPHALIC FISTULA CREATION;  Surgeon: Rosetta Posner, MD;  Location: [place];  Service: Vascular;  Laterality: Left;  .  AV FISTULA PLACEMENT Left 11/23/2015   Procedure: ARTERIOVENOUS (AV) FISTULA CREATION;  Surgeon: Rosetta Posner, MD;  Location: [place];  Service: Vascular;  Laterality: Left;  . BACK SURGERY     x2- discectomy  . CHOLECYSTECTOMY  1994  . CYSTOSCOPY/RETROGRADE/URETEROSCOPY/STONE EXTRACTION WITH BASKET    . ESOPHAGOGASTRODUODENOSCOPY N/A 11/13/2015   Procedure: ESOPHAGOGASTRODUODENOSCOPY (EGD);  Surgeon: Irene Shipper, MD;  Location: [facility] ENDOSCOPY;  Service: Endoscopy;  Laterality: N/A;  . INSERTION OF DIALYSIS CATHETER Left 11/23/2015   Procedure: INSERTION OF DIALYSIS CATHETER;  Surgeon: Rosetta Posner, MD;  Location: [place];  Service: Vascular;  Laterality: Left;  . IR GENERIC HISTORICAL Left 08/04/2016   IR DIALY SHUNT INTRO NEEDLE/INTRACATH INITIAL  W/IMG LEFT 08/04/2016 Markus Daft, MD [facility]-INTER[facility] RAD  . IR GENERIC HISTORICAL  08/04/2016   IR US GUIDE VASC ACCESS LEFT 08/04/2016 Markus Daft, MD [facility]INTER[facility]  . JOINT REPLACEMENT     Total L-Hip replacement, Right Knee 10/20/09  . LEFT AND RIGHT HEART CATHETERIZATION WITH CORONARY ANGIOGRAM N/A 02/22/2013   Procedure: LEFT AND RIGHT HEART CATHETERIZATION WITH CORONARY ANGIOGRAM;  Surgeon: Jolaine Artist, MD;  Location: [facility] CATH LAB;  Service: Cardiovascular;  Laterality: N/A;  . LITHOTRIPSY  90's  . PERIPHERAL VASCULAR CATHETERIZATION Left 11/19/2015   Procedure: A/V/Fistulagram;  Surgeon: Conrad [place], MD;  Location: [place] CV LAB;  Service: Cardiovascular;  Laterality: Left;  . PERIPHERAL VASCULAR CATHETERIZATION Left 07/21/2016   Procedure: A/V Fistulagram;  Surgeon: Conrad Ashley, MD;  Location: [place] CV LAB;  Service: Cardiovascular;  Laterality: Left;  arm  . REVISON OF ARTERIOVENOUS FISTULA Left 05/30/2016   Procedure: REVISION LEFT UPPER ARM FISTULA;  Surgeon: Conrad Ronce[place], MD;  Location: [place];  Service: Vascular;  Laterality: Left;  . REVISON OF ARTERIOVENOUS FISTULA Left 07/22/2016   Procedure: REVISON OF BASILIC VEIN TRANSPOSITION ANASTOMOSIS;  Surgeon: Rosetta Posner, MD;  Location: [place];  Service: Vascular;  Laterality: Left;  . SPINE SURGERY     x 2  . TOTAL HIP ARTHROPLASTY Right 03/22/2016   Procedure: RIGHT TOTAL HIP ARTHROPLASTY ANTERIOR APPROACH;  Surgeon: Mcarthur Rossetti, MD;  Location: [place];  Service: Orthopedics;  Laterality: Right;   Family History  Problem Relation Age of Onset  . Hypertension Mother   . Arthritis Mother        ?RA  . Hypertension Father    Social History   Socioeconomic History  . Marital status: Single    Spouse name: Not on file  . Number of children: 3  . Years of education: Not on file  . Highest education level: Not on file  Occupational History  . Occupation: retired Personnel officer: RETIRED  Social Needs  .  Financial resource strain: Not on file  . Food insecurity:    Worry: Not on file    Inability: Not on file  . Transportation needs:    Medical: Not on file    Non-medical: Not on file  Tobacco Use  . Smoking status: Never Smoker  . Smokeless tobacco: Never Used  Substance and Sexual Activity  . Alcohol use: No    Comment: occasional  . Drug use: No  . Sexual activity: Not on file  Lifestyle  . Physical activity:    Days per week: Not on file    Minutes per session: Not on file  . Stress: Not on file  Relationships  . Social connections:    Talks  on phone: Not on file    Gets together: Not on file    Attends religious service: Not on file    Active member of club or organization: Not on file    Attends meetings of clubs or organizations: Not on file    Relationship status: Not on file  Other Topics Concern  . Not on file  Social History Narrative   Former [employer]   Admitted to [facility] 11/25/15   Widowed   Never smoked   FULL CODE    Outpatient Encounter Medications as of 11/24/2017  Medication Sig  . acetaminophen (TYLENOL) 500 MG tablet Take 500 mg by mouth every 6 (six) hours as needed for mild pain.  [name] albuterol (PROVENTIL) (2.5 MG/3ML) 0.083% nebulizer solution Take 2.5 mg by nebulization every 4 (four) hours as needed for shortness of breath. Reported on 09/25/2015  . atorvastatin (LIPITOR) 10 MG tablet TAKE 1 TABLET(10 MG) BY MOUTH DAILY  . B Complex-C-Folic Acid (RENA-VITE RX) 1 MG TABS Take 1 tablet by mouth at bedtime.   . colchicine 0.6 MG tablet Take 1/2 tablet by mouth once as needed for gout flare. Call if no improvement  . fluticasone (FLONASE) 50 MCG/ACT nasal spray INSTILL 2 SPRAYS INTO BOTH NOSTRILS EVERY DAY  . furosemide (LASIX) 40 MG tablet TAKE 1 TABLET(40 MG) BY MOUTH TWICE DAILY  . gabapentin (NEURONTIN) 300 MG capsule TAKE 1 CAPSULE(300 MG) BY MOUTH AT BEDTIME  . hydroxychloroquine (PLAQUENIL) 200 MG tablet TAKE 1  TABLET(200 MG) BY MOUTH DAILY  . RENVELA 800 MG tablet Take 3 tablets (2,400 mg total) by mouth 3 (three) times daily with meals.  . warfarin (COUMADIN) 7.5 MG tablet TAKE ONE TABLET BY MOUTH DAILY AS DIRECTED BY COUMADIN CLINIC   No facility-administered encounter medications on file as of 11/24/2017.     Activities of Daily Living No flowsheet data found.  Patient Care Team: Debbrah Alar, NP as PCP - General (Internal Medicine) Estanislado Emms, MD as Consulting Physician (Nephrology) Mcarthur Rossetti, MD as Consulting Physician (Orthopedic Surgery) Bensimhon, Shaune Pascal, MD as Consulting Physician (Cardiology) [place]   Assessment:   This is a routine wellness examination for K[place]. Physical assessment deferred to PCP.  Exercise Activities and Dietary recommendations   Diet (meal preparation, eat out, water intake, caffeinated beverages, dairy products, fruits and vegetables): {Desc; diets:16563} Breakfast: Lunch:  Dinner:      Goals    None      Fall Risk Fall Risk  07/04/2017 06/22/2017 06/17/2016 12/03/2015 10/28/2015  Falls in the past year? No No No Yes No  Comment - Emmi Telephone Survey: data to providers prior to load - - -  Number falls in past yr: - - - 2 or more -  Injury with Fall? - - - Yes -  Risk Factor Category  - - - High Fall Risk -  Risk for fall due to : - - - History of fall(s);Impaired balance/gait;Impaired mobility -  Follow up - - - Education provided;Falls prevention discussed -    Depression Screen PHQ 2/9 Scores 07/04/2017 06/17/2016 12/03/2015 10/28/2015  PHQ - 2 Score 1 0 0 0  PHQ- 9 Score 6 - - -    Cognitive Function        Immunization History  Administered Date(s) Administered  . Influenza Split 05/07/2012  . Influenza, High Dose Seasonal PF 06/03/2013  . Influenza,inj,Quad PF,6+ Mos 03/17/2014, 05/05/2015  . Influenza-Unspecified 05/20/2016, 04/17/2017  .  PPD Test 11/25/2015  . Pneumococcal  Conjugate-13 12/04/2013  . Pneumococcal Polysaccharide-23 09/20/2010  . Tdap 07/19/2011    Screening Tests Health Maintenance  Topic Date Due  . URINE MICROALBUMIN  12/16/2015  . OPHTHALMOLOGY EXAM  09/14/2017  . INFLUENZA VACCINE  02/15/2018  . FOOT EXAM  02/20/2018  . HEMOGLOBIN A1C  04/05/2018  . COLONOSCOPY  09/08/2019  . TETANUS/TDAP  07/18/2021  . PNA vac Low Risk Adult  Completed    Plan:   ***  I have personally reviewed and noted the following in the patient's chart:   . Medical and social history . Use of alcohol, tobacco or illicit drugs  . Current medications and supplements . Functional ability and status . Nutritional status . Physical activity . Advanced directives . List of other physicians . Hospitalizations, surgeries, and ER visits in previous 12 months . Vitals . Screenings to include cognitive, depression, and falls . Referrals and appointments  In addition, I have reviewed and discussed with patient certain preventive protocols, quality metrics, and best practice recommendations. A written personalized care plan for preventive services as well as general preventive health recommendations were provided to patient.     Shela Nevin, [place]  11/22/2017

## 2017-11-24 ENCOUNTER — Ambulatory Visit: Payer: Medicare Other

## 2017-11-24 ENCOUNTER — Ambulatory Visit: Payer: Medicare Other | Admitting: *Deleted

## 2017-11-24 DIAGNOSIS — N2581 Secondary hyperparathyroidism of renal origin: Secondary | ICD-10-CM | POA: Diagnosis not present

## 2017-11-24 DIAGNOSIS — I4891 Unspecified atrial fibrillation: Secondary | ICD-10-CM | POA: Diagnosis not present

## 2017-11-24 DIAGNOSIS — D631 Anemia in chronic kidney disease: Secondary | ICD-10-CM | POA: Diagnosis not present

## 2017-11-24 DIAGNOSIS — E8770 Fluid overload, unspecified: Secondary | ICD-10-CM | POA: Diagnosis not present

## 2017-11-24 DIAGNOSIS — N186 End stage renal disease: Secondary | ICD-10-CM | POA: Diagnosis not present

## 2017-11-24 DIAGNOSIS — D509 Iron deficiency anemia, unspecified: Secondary | ICD-10-CM | POA: Diagnosis not present

## 2017-11-27 DIAGNOSIS — I4891 Unspecified atrial fibrillation: Secondary | ICD-10-CM | POA: Diagnosis not present

## 2017-11-27 DIAGNOSIS — N2581 Secondary hyperparathyroidism of renal origin: Secondary | ICD-10-CM | POA: Diagnosis not present

## 2017-11-27 DIAGNOSIS — E8770 Fluid overload, unspecified: Secondary | ICD-10-CM | POA: Diagnosis not present

## 2017-11-27 DIAGNOSIS — N186 End stage renal disease: Secondary | ICD-10-CM | POA: Diagnosis not present

## 2017-11-27 DIAGNOSIS — D509 Iron deficiency anemia, unspecified: Secondary | ICD-10-CM | POA: Diagnosis not present

## 2017-11-27 DIAGNOSIS — D631 Anemia in chronic kidney disease: Secondary | ICD-10-CM | POA: Diagnosis not present

## 2017-11-28 NOTE — Progress Notes (Signed)
Subjective:   Bruce Cole is a 77 y.o. male who presents for Medicare Annual/Subsequent preventive examination.  Review of Systems: No ROS.  Medicare Wellness Visit. Additional risk factors are reflected in the social history.  Cardiac Risk Factors include: advanced age (>62men, >63 women);diabetes mellitus;hypertension;male gender;obesity (BMI >30kg/m2);sedentary lifestyle Sleep patterns: Sleeps about 7 hrs. Home Safety/Smoke Alarms: Feels safe in home. Smoke alarms in place.  Living environment; residence and Firearm Safety: Lives alone in [place]. Shower bench. No grab rails.   Male:   CCS-due 2021.     PSA-  Lab Results  Component Value Date   PSA 0.58 01/30/2012   PSA 0.43 07/24/2009       Objective:    Vitals: BP 120/64 (BP Location: Left Arm, Patient Position: Sitting, Cuff Size: Normal)   Pulse 60   Ht 6\' 2"  (1.88 m)   Wt 284 lb 9.6 oz (129.1 kg)   SpO2 95%   BMI 36.54 kg/m   Body mass index is 36.54 kg/m.  Advanced Directives 11/30/2017 08/01/2016 08/01/2016 07/16/2016 07/16/2016 07/16/2016 05/25/2016  Does Patient Have a Medical Advance Directive? Yes No No - No No Yes  Type of Paramedic of [place];Living will - - - - - Press photographer;Living will  Does patient want to make changes to medical advance directive? - - - - - - No - Patient declined  Copy of [place] in Chart? No - copy requested - - - - - No - copy requested  Would patient like information on creating a medical advance directive? - No - Patient declined No - Patient declined Yes (Inpatient - patient defers creating a medical advance directive at this time) - - -    Tobacco Social History   Tobacco Use  Smoking Status Never Smoker  Smokeless Tobacco Never Used     Counseling given: Not Answered   Clinical Intake: Pain : No/denies pain   Past Medical History:  Diagnosis Date  . Acute blood loss anemia   . Atrial  fibrillation (HCC)    Chronic  . Chronic combined systolic and diastolic heart failure ([place])   . Colon polyp 2000  . Dysrhythmia    hx  . ESRD on hemodialysis ([place])    [place]  . Essential hypertension   . Gastritis and gastroduodenitis   . GI bleed   . Gout   . Heart murmur   . History of blood transfusion   . History of kidney stones   . Nephrolithiasis   . OSA (obstructive sleep apnea) 09/02/2013    IMPRESSION :  1. Mild obstructive sleep apnea with hypopneas causing sleep fragmentation and moderate oxygen desaturation.  2. Short runs of nonsustained VT were noted. His beta blocker may need to be titrated 3. Significant PLM's were noted, the PLM arousal index was low. Please correlate with clinical history of restless leg syndrome.  4. Sleep efficiency was poor.  RECOMMENDATION:  1. Treatment options for this degree of sleep disordered breathing include weight loss and positional therapy to avoid supine sleep 2. Consider titrating beta blocker further, defer to cardiologist 3. Patient should be cautioned against driving when sleepy.They should be asked to avoid medications with sedative side effects     . Osteoarthritis of right hip   . Pneumonia    hx 30 yrs ago  . Primary osteoarthritis of right hip   . Rheumatoid arthritis ([place])   . Thrombocytopenia ([place])  Past Surgical History:  Procedure Laterality Date  . AV FISTULA PLACEMENT Left 08/26/2015   Procedure: LEFT RADIOCEPHALIC FISTULA CREATION;  Surgeon: Rosetta Posner, MD;  Location: [place];  Service: Vascular;  Laterality: Left;  . AV FISTULA PLACEMENT Left 11/23/2015   Procedure: ARTERIOVENOUS (AV) FISTULA CREATION;  Surgeon: Rosetta Posner, MD;  Location: [facility];  Service: Vascular;  Laterality: Left;  . BACK SURGERY     x2- discectomy  . CHOLECYSTECTOMY  1994  . CYSTOSCOPY/RETROGRADE/URETEROSCOPY/STONE EXTRACTION WITH BASKET    . ESOPHAGOGASTRODUODENOSCOPY N/A 11/13/2015   Procedure: ESOPHAGOGASTRODUODENOSCOPY  (EGD);  Surgeon: Irene Shipper, MD;  Location[facility] ENDOSCOPY;  Service: Endoscopy;  Laterality: N/A;  . INSERTION OF DIALYSIS CATHETER Left 11/23/2015   Procedure: INSERTION OF DIALYSIS CATHETER;  Surgeon: Rosetta Posner, MD;  Location: [place];  Service: Vascular;  Laterality: Left;  . IR GENERIC HISTORICAL Left 08/04/2016   IR DIALY SHUNT INTRO NEEDLE/INTRACATH INITIAL W/IMG LEFT 08/04/2016 Markus Daft, MD [facility]-INTERV RAD  . IR GENERIC HISTORICAL  08/04/2016   IR US GUIDE VASC ACCESS LEFT 08/04/2016 Markus Daft, MD [facility]INTER[facility]  . JOINT REPLACEMENT     Total L-Hip replacement, Right Knee 10/20/09  . LEFT AND RIGHT HEART CATHETERIZATION WITH CORONARY ANGIOGRAM N/A 02/22/2013   Procedure: LEFT AND RIGHT HEART CATHETERIZATION WITH CORONARY ANGIOGRAM;  Surgeon: Jolaine Artist, MD;  [facility] C[facility] LAB;  Service: Cardiovascular;  Laterality: N/A;  . LITHOTRIPSY  90's  . PERIPHERAL VASCULAR CATHETERIZATION Left 11/19/2015   Procedure: A/V/Fistulagram;  Surgeon: Conrad Halliday, MD;  Location: [place] CV LAB;  Service: Cardiovascular;  Laterality: Left;  . PERIPHERAL VASCULAR CATHETERIZATION Left 07/21/2016   Procedure: A/V Fistulagram;  Surgeon: Conrad Mayfield, MD;  Location: [place] [facility] CV LAB;  Service: Cardiovascular;  Laterality: Left;  arm  . REVISON OF ARTERIOVENOUS FISTULA Left 05/30/2016   Procedure: REVISION LEFT UPPER ARM FISTULA;  Surgeon: Conrad Lyman, MD;  Location: [place];  Service: Vascular;  Laterality: Left;  . REVISON OF ARTERIOVENOUS FISTULA Left 07/22/2016   Procedure: REVISON OF BASILIC VEIN TRANSPOSITION ANASTOMOSIS;  Surgeon: Rosetta Posner, MD;  Location: [place];  Service: Vascular;  Laterality: Left;  . SPINE SURGERY     x 2  . TOTAL HIP ARTHROPLASTY Right 03/22/2016   Procedure: RIGHT TOTAL HIP ARTHROPLASTY ANTERIOR APPROACH;  Surgeon: Mcarthur Rossetti, MD;  Location: [place];  Service: Orthopedics;  Laterality: Right;   Family History  Problem Relation Age of Onset  . Hypertension Mother     . Arthritis Mother        ?RA  . Hypertension Father    Social History   Socioeconomic History  . Marital status: Single    Spouse name: Not on file  . Number of children: 3  . Years of education: Not on file  . Highest education level: Not on file  Occupational History  . Occupation: retired Personnel officer: RETIRED  Social Needs  . Financial resource strain: Not on file  . Food insecurity:    Worry: Not on file    Inability: Not on file  . Transportation needs:    Medical: Not on file    Non-medical: Not on file  Tobacco Use  . Smoking status: Never Smoker  . Smokeless tobacco: Never Used  Substance and Sexual Activity  . Alcohol use: No    Comment: occasional  . Drug use: No  . Sexual activity: Not on file  Lifestyle  . Physical activity:    Days per week: Not on file    Minutes per session: Not on file  . Stress: Not on file  Relationships  . Social connections:    Talks on phone: Not on file    Gets together: Not on file    Attends religious service: Not on file    Active member of club or organization: Not on file    Attends meetings of clubs or organizations: Not on file    Relationship status: Not on file  Other Topics Concern  . Not on file  Social History Narrative   Former [employer]   Admitted to [facility] 11/25/15   Widowed   Never smoked   FULL CODE    Outpatient Encounter Medications as of 11/30/2017  Medication Sig  . acetaminophen (TYLENOL) 500 MG tablet Take 500 mg by mouth every 6 (six) hours as needed for mild pain.  [name] albuterol (PROVENTIL) (2.5 MG/3ML) 0.083% nebulizer solution Take 2.5 mg by nebulization every 4 (four) hours as needed for shortness of breath. Reported on 09/25/2015  . atorvastatin (LIPITOR) 10 MG tablet TAKE 1 TABLET(10 MG) BY MOUTH DAILY  . B Complex-C-Folic Acid (RENA-VITE RX) 1 MG TABS Take 1 tablet by mouth at bedtime.   . colchicine 0.6 MG tablet Take 1/2 tablet by mouth  once as needed for gout flare. Call if no improvement  . fluticasone (FLONASE) 50 MCG/ACT nasal spray INSTILL 2 SPRAYS INTO BOTH NOSTRILS EVERY DAY  . furosemide (LASIX) 40 MG tablet TAKE 1 TABLET(40 MG) BY MOUTH TWICE DAILY  . gabapentin (NEURONTIN) 300 MG capsule TAKE 1 CAPSULE(300 MG) BY MOUTH AT BEDTIME  . hydroxychloroquine (PLAQUENIL) 200 MG tablet TAKE 1 TABLET(200 MG) BY MOUTH DAILY  . RENVELA 800 MG tablet Take 3 tablets (2,400 mg total) by mouth 3 (three) times daily with meals.  . warfarin (COUMADIN) 7.5 MG tablet TAKE ONE TABLET BY MOUTH DAILY AS DIRECTED BY COUMADIN CLINIC   No facility-administered encounter medications on file as of 11/30/2017.     Activities of Daily Living In your present state of health, do you have any difficulty performing the following activities: 11/30/2017  Hearing? N  Vision? N  Comment wears glasses. reports diabetic eye exam last month.  Difficulty concentrating or making decisions? N  Walking or climbing stairs? Y  Dressing or bathing? Y  Comment gets tired. uses shower bench.  Doing errands, shopping? Y  Comment orders online.  Preparing Food and eating ? N  Using the Toilet? N  In the past six months, have you accidently leaked urine? N  Do you have problems with loss of bowel control? Y  Managing your Medications? N  Managing your Finances? N  Housekeeping or managing your Housekeeping? N  Comment housekeeper once per month  Some recent data might be hidden    Patient Care Team: Debbrah Alar, NP as PCP - General (Internal Medicine) Estanislado Emms, MD as Consulting Physician (Nephrology) Mcarthur Rossetti, MD as Consulting Physician (Orthopedic Surgery) Bensimhon, Shaune Pascal, MD as Consulting Physician (Cardiology) [place]   Assessment:   This is a routine wellness examination for Bruce Cole. Physical assessment deferred to PCP.   Exercise Activities and Dietary recommendations Current Exercise  Habits: The patient does not participate in regular exercise at present, Exercise limited by: orthopedic condition(s) Diet (meal preparation, eat out, water intake, caffeinated beverages, dairy products, fruits and vegetables): Dialysis and Coumadin diet  Goals    . Get admitted to 6 week rehab or do out patient for strength       Fall Risk Fall Risk  11/30/2017 07/04/2017 06/22/2017 06/17/2016 12/03/2015  Falls in the past year? No No No No Yes  Comment - - Emmi Telephone Survey: data to providers prior to load - -  Number falls in past yr: - - - - 2 or more  Injury with Fall? - - - - Yes  Risk Factor Category  - - - - High Fall Risk  Risk for fall due to : - - - - History of fall(s);Impaired balance/gait;Impaired mobility  Follow up - - - - Education provided;Falls prevention discussed    Depression Screen PHQ 2/9 Scores 11/30/2017 07/04/2017 06/17/2016 12/03/2015  PHQ - 2 Score 0 1 0 0  PHQ- 9 Score - 6 - -    Cognitive Function Ad8 score reviewed for issues:  Issues making decisions:no  Less interest in hobbies / activities:no  Repeats questions, stories (family complaining):no  Trouble using ordinary gadgets (microwave, computer, phone):no  Forgets the month or year: no  Mismanaging finances: no  Remembering appts:no  Daily problems with thinking and/or memory:no Ad8 score is=0        Immunization History  Administered Date(s) Administered  . Influenza Split 05/07/2012  . Influenza, High Dose Seasonal PF 06/03/2013  . Influenza,inj,Quad PF,6+ Mos 03/17/2014, 05/05/2015  . Influenza-Unspecified 05/20/2016, 04/17/2017  . PPD Test 11/25/2015  . Pneumococcal Conjugate-13 12/04/2013  . Pneumococcal Polysaccharide-23 09/20/2010  . Tdap 07/19/2011    Screening Tests Health Maintenance  Topic Date Due  . URINE MICROALBUMIN  12/16/2015  . OPHTHALMOLOGY EXAM  09/14/2017  . INFLUENZA VACCINE  02/15/2018  . FOOT EXAM  02/20/2018  . HEMOGLOBIN A1C  04/05/2018  .  COLONOSCOPY  09/08/2019  . TETANUS/TDAP  07/18/2021  . PNA vac Low Risk Adult  Completed      Plan:    Follow up with [name] as scheduled  Eat heart healthy diet and increase physical activity as tolerated.  Continue doing brain stimulating activities (puzzles, reading, adult coloring books, staying active) to keep memory sharp.   Bring a copy of your living will and/or healthcare power of attorney to your next office visit.    I have personally reviewed and noted the following in the patient's chart:   . Medical and social history . Use of alcohol, tobacco or illicit drugs  . Current medications and supplements . Functional ability and status . Nutritional status . Physical activity . Advanced directives . List of other physicians . Hospitalizations, surgeries, and ER visits in previous 12 months . Vitals . Screenings to include cognitive, depression, and falls . Referrals and appointments  In addition, I have reviewed and discussed with patient certain preventive protocols, quality metrics, and best practice recommendations. A written personalized care plan for preventive services as well as general preventive health recommendations were provided to patient.     Shela Nevin, [place]  11/30/2017

## 2017-11-29 DIAGNOSIS — E8770 Fluid overload, unspecified: Secondary | ICD-10-CM | POA: Diagnosis not present

## 2017-11-29 DIAGNOSIS — I4891 Unspecified atrial fibrillation: Secondary | ICD-10-CM | POA: Diagnosis not present

## 2017-11-29 DIAGNOSIS — D509 Iron deficiency anemia, unspecified: Secondary | ICD-10-CM | POA: Diagnosis not present

## 2017-11-29 DIAGNOSIS — N186 End stage renal disease: Secondary | ICD-10-CM | POA: Diagnosis not present

## 2017-11-29 DIAGNOSIS — N2581 Secondary hyperparathyroidism of renal origin: Secondary | ICD-10-CM | POA: Diagnosis not present

## 2017-11-29 DIAGNOSIS — D631 Anemia in chronic kidney disease: Secondary | ICD-10-CM | POA: Diagnosis not present

## 2017-11-30 ENCOUNTER — Encounter: Payer: Self-pay | Admitting: *Deleted

## 2017-11-30 ENCOUNTER — Ambulatory Visit (INDEPENDENT_AMBULATORY_CARE_PROVIDER_SITE_OTHER): Payer: Medicare Other | Admitting: *Deleted

## 2017-11-30 VITALS — BP 120/64 | HR 60 | Ht 74.0 in | Wt 284.6 lb

## 2017-11-30 DIAGNOSIS — Z0000 Encounter for general adult medical examination without abnormal findings: Secondary | ICD-10-CM

## 2017-11-30 NOTE — Patient Instructions (Signed)
Follow up with [name] as scheduled  Eat heart healthy diet and increase physical activity as tolerated.  Continue doing brain stimulating activities (puzzles, reading, adult coloring books, staying active) to keep memory sharp.   Bring a copy of your living will and/or healthcare power of attorney to your next office visit.   Bruce Cole , Thank you for taking time to come for your Medicare Wellness Visit. I appreciate your ongoing commitment to your health goals. Please review the following plan we discussed and let me know if I can assist you in the future.   These are the goals we discussed: Goals    . Get admitted to 6 week rehab or do out patient for strength       This is a list of the screening recommended for you and due dates:  Health Maintenance  Topic Date Due  . Urine Protein Check  12/16/2015  . Eye exam for diabetics  09/14/2017  . Flu Shot  02/15/2018  . Complete foot exam   02/20/2018  . Hemoglobin A1C  04/05/2018  . Colon Cancer Screening  09/08/2019  . Tetanus Vaccine  07/18/2021  . Pneumonia vaccines  Completed    Health Maintenance, Male A healthy lifestyle and preventive care is important for your health and wellness. Ask your health care provider about what schedule of regular examinations is right for you. What should I know about weight and diet? Eat a Healthy Diet  Eat plenty of vegetables, fruits, whole grains, low-fat dairy products, and lean protein.  Do not eat a lot of foods high in solid fats, added sugars, or salt.  Maintain a Healthy Weight Regular exercise can help you achieve or maintain a healthy weight. You should:  Do at least 150 minutes of exercise each week. The exercise should increase your heart rate and make you sweat (moderate-intensity exercise).  Do strength-training exercises at least twice a week.  Watch Your Levels of Cholesterol and Blood Lipids  Have your blood tested for lipids and cholesterol every 5 years  starting at 75 years of age. If you are at high risk for heart disease, you should start having your blood tested when you are 75 years old. You may need to have your cholesterol levels checked more often if: ? Your lipid or cholesterol levels are high. ? You are older than 75 years of age. ? You are at high risk for heart disease.  What should I know about cancer screening? Many types of cancers can be detected early and may often be prevented. Lung Cancer  You should be screened every year for lung cancer if: ? You are a current smoker who has smoked for at least 30 years. ? You are a former smoker who has quit within the past 15 years.  Talk to your health care provider about your screening options, when you should start screening, and how often you should be screened.  Colorectal Cancer  Routine colorectal cancer screening usually begins at 75 years of age and should be repeated every 5-10 years until you are 75 years old. You may need to be screened more often if early forms of precancerous polyps or small growths are found. Your health care provider may recommend screening at an earlier age if you have risk factors for colon cancer.  Your health care provider may recommend using home test kits to check for hidden blood in the stool.  A small camera at the end of a tube can be  used to examine your colon (sigmoidoscopy or colonoscopy). This checks for the earliest forms of colorectal cancer.  Prostate and Testicular Cancer  Depending on your age and overall health, your health care provider may do certain tests to screen for prostate and testicular cancer.  Talk to your health care provider about any symptoms or concerns you have about testicular or prostate cancer.  Skin Cancer  Check your skin from head to toe regularly.  Tell your health care provider about any new moles or changes in moles, especially if: ? There is a change in a mole's size, shape, or color. ? You have a  mole that is larger than a pencil eraser.  Always use sunscreen. Apply sunscreen liberally and repeat throughout the day.  Protect yourself by wearing long sleeves, pants, a wide-brimmed hat, and sunglasses when outside.  What should I know about heart disease, diabetes, and high blood pressure?  If you are 48-34 years of age, have your blood pressure checked every 3-5 years. If you are 68 years of age or older, have your blood pressure checked every year. You should have your blood pressure measured twice-once when you are at a hospital or clinic, and once when you are not at a hospital or clinic. Record the average of the two measurements. To check your blood pressure when you are not at a hospital or clinic, you can use: ? An automated blood pressure machine at a pharmacy. ? A home blood pressure monitor.  Talk to your health care provider about your target blood pressure.  If you are between 33-44 years old, ask your health care provider if you should take aspirin to prevent heart disease.  Have regular diabetes screenings by checking your fasting blood sugar level. ? If you are at a normal weight and have a low risk for diabetes, have this test once every three years after the age of 45. ? If you are overweight and have a high risk for diabetes, consider being tested at a younger age or more often.  A one-time screening for abdominal aortic aneurysm (AAA) by ultrasound is recommended for men aged 36-75 years who are current or former smokers. What should I know about preventing infection? Hepatitis B If you have a higher risk for hepatitis B, you should be screened for this virus. Talk with your health care provider to find out if you are at risk for hepatitis B infection. Hepatitis C Blood testing is recommended for:  Everyone born from [birth year] through 1963.  Anyone with known risk factors for hepatitis C.  Sexually Transmitted Diseases (STDs)  You should be screened each year for  STDs including gonorrhea and chlamydia if: ? You are sexually active and are younger than 74 years of age. ? You are older than 75 years of age and your health care provider tells you that you are at risk for this type of infection. ? Your sexual activity has changed since you were last screened and you are at an increased risk for chlamydia or gonorrhea. Ask your health care provider if you are at risk.  Talk with your health care provider about whether you are at high risk of being infected with HIV. Your health care provider may recommend a prescription medicine to help prevent HIV infection.  What else can I do?  Schedule regular health, dental, and eye exams.  Stay current with your vaccines (immunizations).  Do not use any tobacco products, such as cigarettes, chewing tobacco, and e-cigarettes. If  you need help quitting, ask your health care provider.  Limit alcohol intake to no more than 2 drinks per day. One drink equals 12 ounces of beer, 5 ounces of wine, or 1 ounces of hard liquor.  Do not use street drugs.  Do not share needles.  Ask your health care provider for help if you need support or information about quitting drugs.  Tell your health care provider if you often feel depressed.  Tell your health care provider if you have ever been abused or do not feel safe at home. This information is not intended to replace advice given to you by your health care provider. Make sure you discuss any questions you have with your health care provider. Document Released: 12/31/2007 Document Revised: 03/02/2016 Document Reviewed: 04/07/2015 Elsevier Interactive Patient Education  [employer].

## 2017-11-30 NOTE — Progress Notes (Signed)
Reviewed  Adelyne Marchese R Lowne Chase, DO  

## 2017-12-01 DIAGNOSIS — N186 End stage renal disease: Secondary | ICD-10-CM | POA: Diagnosis not present

## 2017-12-01 DIAGNOSIS — N2581 Secondary hyperparathyroidism of renal origin: Secondary | ICD-10-CM | POA: Diagnosis not present

## 2017-12-01 DIAGNOSIS — I4891 Unspecified atrial fibrillation: Secondary | ICD-10-CM | POA: Diagnosis not present

## 2017-12-01 DIAGNOSIS — E8770 Fluid overload, unspecified: Secondary | ICD-10-CM | POA: Diagnosis not present

## 2017-12-01 DIAGNOSIS — D631 Anemia in chronic kidney disease: Secondary | ICD-10-CM | POA: Diagnosis not present

## 2017-12-01 DIAGNOSIS — D509 Iron deficiency anemia, unspecified: Secondary | ICD-10-CM | POA: Diagnosis not present

## 2017-12-04 DIAGNOSIS — I4891 Unspecified atrial fibrillation: Secondary | ICD-10-CM | POA: Diagnosis not present

## 2017-12-04 DIAGNOSIS — D631 Anemia in chronic kidney disease: Secondary | ICD-10-CM | POA: Diagnosis not present

## 2017-12-04 DIAGNOSIS — D509 Iron deficiency anemia, unspecified: Secondary | ICD-10-CM | POA: Diagnosis not present

## 2017-12-04 DIAGNOSIS — N2581 Secondary hyperparathyroidism of renal origin: Secondary | ICD-10-CM | POA: Diagnosis not present

## 2017-12-04 DIAGNOSIS — E8770 Fluid overload, unspecified: Secondary | ICD-10-CM | POA: Diagnosis not present

## 2017-12-04 DIAGNOSIS — N186 End stage renal disease: Secondary | ICD-10-CM | POA: Diagnosis not present

## 2017-12-06 DIAGNOSIS — E8770 Fluid overload, unspecified: Secondary | ICD-10-CM | POA: Diagnosis not present

## 2017-12-06 DIAGNOSIS — N186 End stage renal disease: Secondary | ICD-10-CM | POA: Diagnosis not present

## 2017-12-06 DIAGNOSIS — D509 Iron deficiency anemia, unspecified: Secondary | ICD-10-CM | POA: Diagnosis not present

## 2017-12-06 DIAGNOSIS — D689 Coagulation defect, unspecified: Secondary | ICD-10-CM | POA: Diagnosis not present

## 2017-12-06 DIAGNOSIS — N2581 Secondary hyperparathyroidism of renal origin: Secondary | ICD-10-CM | POA: Diagnosis not present

## 2017-12-06 DIAGNOSIS — I4891 Unspecified atrial fibrillation: Secondary | ICD-10-CM | POA: Diagnosis not present

## 2017-12-06 DIAGNOSIS — D631 Anemia in chronic kidney disease: Secondary | ICD-10-CM | POA: Diagnosis not present

## 2017-12-07 DIAGNOSIS — E8770 Fluid overload, unspecified: Secondary | ICD-10-CM | POA: Diagnosis not present

## 2017-12-07 DIAGNOSIS — I4891 Unspecified atrial fibrillation: Secondary | ICD-10-CM | POA: Diagnosis not present

## 2017-12-07 DIAGNOSIS — D509 Iron deficiency anemia, unspecified: Secondary | ICD-10-CM | POA: Diagnosis not present

## 2017-12-07 DIAGNOSIS — D631 Anemia in chronic kidney disease: Secondary | ICD-10-CM | POA: Diagnosis not present

## 2017-12-07 DIAGNOSIS — N2581 Secondary hyperparathyroidism of renal origin: Secondary | ICD-10-CM | POA: Diagnosis not present

## 2017-12-07 DIAGNOSIS — N186 End stage renal disease: Secondary | ICD-10-CM | POA: Diagnosis not present

## 2017-12-08 DIAGNOSIS — I4891 Unspecified atrial fibrillation: Secondary | ICD-10-CM | POA: Diagnosis not present

## 2017-12-08 DIAGNOSIS — D631 Anemia in chronic kidney disease: Secondary | ICD-10-CM | POA: Diagnosis not present

## 2017-12-08 DIAGNOSIS — E8770 Fluid overload, unspecified: Secondary | ICD-10-CM | POA: Diagnosis not present

## 2017-12-08 DIAGNOSIS — D509 Iron deficiency anemia, unspecified: Secondary | ICD-10-CM | POA: Diagnosis not present

## 2017-12-08 DIAGNOSIS — N2581 Secondary hyperparathyroidism of renal origin: Secondary | ICD-10-CM | POA: Diagnosis not present

## 2017-12-08 DIAGNOSIS — N186 End stage renal disease: Secondary | ICD-10-CM | POA: Diagnosis not present

## 2017-12-11 DIAGNOSIS — D509 Iron deficiency anemia, unspecified: Secondary | ICD-10-CM | POA: Diagnosis not present

## 2017-12-11 DIAGNOSIS — N186 End stage renal disease: Secondary | ICD-10-CM | POA: Diagnosis not present

## 2017-12-11 DIAGNOSIS — D631 Anemia in chronic kidney disease: Secondary | ICD-10-CM | POA: Diagnosis not present

## 2017-12-11 DIAGNOSIS — E8770 Fluid overload, unspecified: Secondary | ICD-10-CM | POA: Diagnosis not present

## 2017-12-11 DIAGNOSIS — N2581 Secondary hyperparathyroidism of renal origin: Secondary | ICD-10-CM | POA: Diagnosis not present

## 2017-12-11 DIAGNOSIS — I4891 Unspecified atrial fibrillation: Secondary | ICD-10-CM | POA: Diagnosis not present

## 2017-12-12 ENCOUNTER — Encounter: Payer: Self-pay | Admitting: Family

## 2017-12-12 ENCOUNTER — Ambulatory Visit (INDEPENDENT_AMBULATORY_CARE_PROVIDER_SITE_OTHER): Payer: Medicare Other | Admitting: Family

## 2017-12-12 VITALS — BP 97/47 | HR 53 | Temp 98.5°F | Resp 16 | Ht 74.0 in | Wt 274.0 lb

## 2017-12-12 DIAGNOSIS — R269 Unspecified abnormalities of gait and mobility: Secondary | ICD-10-CM | POA: Diagnosis not present

## 2017-12-12 NOTE — Progress Notes (Signed)
Subjective:    Patient ID: Bruce Cole, male    DOB: Jul 29, 1940, 77 y.o.   MRN: [id number]  HPI   Bruce Cole is a 77 yr old male who presents today for follow up.  RA- Patient is maintained on plaquenil and is followed by Rheumatology.  Reports balance is not as good as it was.  Has more trouble recently getting out of a chair. Has HD on M/W/F.  Wants to start a PT regimen to help with his gait and LE strength. He is s/p bilateral THA.     Review of Systems See HPI  Past Medical History:  Diagnosis Date  . Acute blood loss anemia   . Atrial fibrillation (HCC)    Chronic  . Chronic combined systolic and diastolic heart failure ([place])   . Colon polyp 2000  . Dysrhythmia    hx  . ESRD on hemodialysis ([place])    [place]  . Essential hypertension   . Gastritis and gastroduodenitis   . GI bleed   . Gout   . Heart murmur   . History of blood transfusion   . History of kidney stones   . Nephrolithiasis   . OSA (obstructive sleep apnea) 09/02/2013    IMPRESSION :  1. Mild obstructive sleep apnea with hypopneas causing sleep fragmentation and moderate oxygen desaturation.  2. Short runs of nonsustained VT were noted. His beta blocker may need to be titrated 3. Significant PLM's were noted, the PLM arousal index was low. Please correlate with clinical history of restless leg syndrome.  4. Sleep efficiency was poor.  RECOMMENDATION:  1. Treatment options for this degree of sleep disordered breathing include weight loss and positional therapy to avoid supine sleep 2. Consider titrating beta blocker further, defer to cardiologist 3. Patient should be cautioned against driving when sleepy.They should be asked to avoid medications with sedative side effects     . Osteoarthritis of right hip   . Pneumonia    hx 30 yrs ago  . Primary osteoarthritis of right hip   . Rheumatoid arthritis ([place])   . Thrombocytopenia ([place])      Social History   Socioeconomic History    . Marital status: Single    Spouse name: Not on file  . Number of children: 3  . Years of education: Not on file  . Highest education level: Not on file  Occupational History  . Occupation: retired Personnel officer: RETIRED  Social Needs  . Financial resource strain: Not on file  . Food insecurity:    Worry: Not on file    Inability: Not on file  . Transportation needs:    Medical: Not on file    Non-medical: Not on file  Tobacco Use  . Smoking status: Never Smoker  . Smokeless tobacco: Never Used  Substance and Sexual Activity  . Alcohol use: No    Comment: occasional  . Drug use: No  . Sexual activity: Not on file  Lifestyle  . Physical activity:    Days per week: Not on file    Minutes per session: Not on file  . Stress: Not on file  Relationships  . Social connections:    Talks on phone: Not on file    Gets together: Not on file    Attends religious service: Not on file    Active member of club or organization: Not on file    Attends meetings of clubs or  organizations: Not on file    Relationship status: Not on file  . Intimate partner violence:    Fear of current or ex partner: Not on file    Emotionally abused: Not on file    Physically abused: Not on file    Forced sexual activity: Not on file  Other Topics Concern  . Not on file  Social History Narrative   Former [employer]   Admitted to [facility] 11/25/15   Widowed   Never smoked   FULL CODE    Past Surgical History:  Procedure Laterality Date  . AV FISTULA PLACEMENT Left 08/26/2015   Procedure: LEFT RADIOCEPHALIC FISTULA CREATION;  Surgeon: Rosetta Posner, MD;  Location: [facility];  Service: Vascular;  Laterality: Left;  . AV FISTULA PLACEMENT Left 11/23/2015   Procedure: ARTERIOVENOUS (AV) FISTULA CREATION;  Surgeon: Rosetta Posner, MD;  Location: [place];  Service: Vascular;  Laterality: Left;  . BACK SURGERY     x2- discectomy  . CHOLECYSTECTOMY  1994  .  CYSTOSCOPY/RETROGRADE/URETEROSCOPY/STONE EXTRACTION WITH BASKET    . ESOPHAGOGASTRODUODENOSCOPY N/A 11/13/2015   Procedure: ESOPHAGOGASTRODUODENOSCOPY (EGD);  Surgeon: Irene Shipper, MD;  Location[facility] ENDOSCOPY;  Service: Endoscopy;  Laterality: N/A;  . INSERTION OF DIALYSIS CATHETER Left 11/23/2015   Procedure: INSERTION OF DIALYSIS CATHETER;  Surgeon: Rosetta Posner, MD;  Location: [place];  Service: Vascular;  Laterality: Left;  . IR GENERIC HISTORICAL Left 08/04/2016   IR DIALY SHUNT INTRO NEEDLE/INTRACATH INITIAL W/IMG LEFT 08/04/2016 Markus Daft, MD [facility]INTER[facility]  . IR GENERIC HISTORICAL  08/04/2016   IR US GUIDE VASC ACCESS LEFT 08/04/2016 Markus Daft, MD [facility]  . JOINT REPLACEMENT     Total L-Hip replacement, Right Knee 10/20/09  . LEFT AND RIGHT HEART CATHETERIZATION WITH CORONARY ANGIOGRAM N/A 02/22/2013   Procedure: LEFT AND RIGHT HEART CATHETERIZATION WITH CORONARY ANGIOGRAM;  Surgeon: Jolaine Artist, MD;  Location: [facility] CATH LAB;  Service: Cardiovascular;  Laterality: N/A;  . LITHOTRIPSY  90's  . PERIPHERAL VASCULAR CATHETERIZATION Left 11/19/2015   Procedure: A/V/Fistulagram;  Surgeon: Conrad Atwater, MD;  Location: [place] CV LAB;  Service: Cardiovascular;  Laterality: Left;  . PERIPHERAL VASCULAR CATHETERIZATION Left 07/21/2016   Procedure: A/V Fistulagram;  Surgeon: Conrad Modoc, MD;  Location: [place] CV LAB;  Service: Cardiovascular;  Laterality: Left;  arm  . REVISON OF ARTERIOVENOUS FISTULA Left 05/30/2016   Procedure: REVISION LEFT UPPER ARM FISTULA;  Surgeon: Conrad Bricelyn, MD;  Location: [place];  Service: Vascular;  Laterality: Left;  . REVISON OF ARTERIOVENOUS FISTULA Left 07/22/2016   Procedure: REVISON OF BASILIC VEIN TRANSPOSITION ANASTOMOSIS;  Surgeon: Rosetta Posner, MD;  Location: [place];  Service: Vascular;  Laterality: Left;  . SPINE SURGERY     x 2  . TOTAL HIP ARTHROPLASTY Right 03/22/2016   Procedure: RIGHT TOTAL HIP ARTHROPLASTY ANTERIOR APPROACH;  Surgeon: Mcarthur Rossetti, MD;  Location: [place];  Service: Orthopedics;  Laterality: Right;    Family History  Problem Relation Age of Onset  . Hypertension Mother   . Arthritis Mother        ?RA  . Hypertension Father     Allergies  Allergen Reactions  . Ace Inhibitors Other (See Comments)    Worsening renal insufficiency    Current Outpatient Medications on File Prior to Visit  Medication Sig Dispense Refill  . acetaminophen (TYLENOL) 500 MG tablet Take 500 mg by mouth every 6 (  six) hours as needed for mild pain.    [name] albuterol (PROVENTIL) (2.5 MG/3ML) 0.083% nebulizer solution Take 2.5 mg by nebulization every 4 (four) hours as needed for shortness of breath. Reported on 09/25/2015    . atorvastatin (LIPITOR) 10 MG tablet TAKE 1 TABLET(10 MG) BY MOUTH DAILY 90 tablet 0  . B Complex-C-Folic Acid (RENA-VITE RX) 1 MG TABS Take 1 tablet by mouth at bedtime.   6  . colchicine 0.6 MG tablet Take 1/2 tablet by mouth once as needed for gout flare. Call if no improvement 15 tablet 0  . fluticasone (FLONASE) 50 MCG/ACT nasal spray INSTILL 2 SPRAYS INTO BOTH NOSTRILS EVERY DAY 16 g 3  . furosemide (LASIX) 40 MG tablet TAKE 1 TABLET(40 MG) BY MOUTH TWICE DAILY 180 tablet 1  . gabapentin (NEURONTIN) 300 MG capsule TAKE 1 CAPSULE(300 MG) BY MOUTH AT BEDTIME 90 capsule 1  . hydroxychloroquine (PLAQUENIL) 200 MG tablet TAKE 1 TABLET(200 MG) BY MOUTH DAILY 90 tablet 1  . RENVELA 800 MG tablet Take 3 tablets (2,400 mg total) by mouth 3 (three) times daily with meals. 90 tablet 0  . warfarin (COUMADIN) 7.5 MG tablet TAKE ONE TABLET BY MOUTH DAILY AS DIRECTED BY COUMADIN CLINIC 100 tablet 0   No current facility-administered medications on file prior to visit.     BP (!) 97/47 (BP Location: Right Arm, Patient Position: Sitting, Cuff Size: Large)   Pulse (!) 53   Temp 98.5 F (36.9 C) (Oral)   Resp 16   Ht 6\' 2"  (1.88 m)   Wt 274 lb (124.3 kg)   SpO2 98%   BMI 35.18 kg/m       Objective:   Physical Exam    Constitutional: He is oriented to person, place, and time. He appears well-developed and well-nourished. No distress.  HENT:  Head: Normocephalic and atraumatic.  Cardiovascular: Normal rate and regular rhythm.  No murmur heard. Pulmonary/Chest: Effort normal and breath sounds normal. No respiratory distress. He has no wheezes. He has no rales.  Musculoskeletal: He exhibits no edema.  Neurological: He is alert and oriented to person, place, and time.  Unsteady gait- walks with cane  Skin: Skin is warm and dry.  Psychiatric: He has a normal mood and affect. His behavior is normal. Thought content normal.          Assessment & Plan:  Gait disorder- will refer for outpt PT.

## 2017-12-12 NOTE — Patient Instructions (Signed)
You should be contacted about scheduling your physical therapy.

## 2017-12-13 ENCOUNTER — Telehealth: Payer: Self-pay | Admitting: *Deleted

## 2017-12-13 DIAGNOSIS — N2581 Secondary hyperparathyroidism of renal origin: Secondary | ICD-10-CM | POA: Diagnosis not present

## 2017-12-13 DIAGNOSIS — N186 End stage renal disease: Secondary | ICD-10-CM | POA: Diagnosis not present

## 2017-12-13 DIAGNOSIS — I4891 Unspecified atrial fibrillation: Secondary | ICD-10-CM | POA: Diagnosis not present

## 2017-12-13 DIAGNOSIS — E8770 Fluid overload, unspecified: Secondary | ICD-10-CM | POA: Diagnosis not present

## 2017-12-13 DIAGNOSIS — D509 Iron deficiency anemia, unspecified: Secondary | ICD-10-CM | POA: Diagnosis not present

## 2017-12-13 DIAGNOSIS — D631 Anemia in chronic kidney disease: Secondary | ICD-10-CM | POA: Diagnosis not present

## 2017-12-13 NOTE — Telephone Encounter (Signed)
Left detailed message on pt's voicemail regarding below instruction. Order has been faxed to dialysis.

## 2017-12-13 NOTE — Telephone Encounter (Signed)
Thank you. Continue current dose of coumadin, recheck INR in 1 month.

## 2017-12-13 NOTE — Telephone Encounter (Signed)
Spoke with Dialysis facility to obtain pt's most recent INR level. Last checked 12/06/17 and result was 2.34.  Please advise?

## 2017-12-15 DIAGNOSIS — D509 Iron deficiency anemia, unspecified: Secondary | ICD-10-CM | POA: Diagnosis not present

## 2017-12-15 DIAGNOSIS — N2581 Secondary hyperparathyroidism of renal origin: Secondary | ICD-10-CM | POA: Diagnosis not present

## 2017-12-15 DIAGNOSIS — D631 Anemia in chronic kidney disease: Secondary | ICD-10-CM | POA: Diagnosis not present

## 2017-12-15 DIAGNOSIS — E8770 Fluid overload, unspecified: Secondary | ICD-10-CM | POA: Diagnosis not present

## 2017-12-15 DIAGNOSIS — I4891 Unspecified atrial fibrillation: Secondary | ICD-10-CM | POA: Diagnosis not present

## 2017-12-15 DIAGNOSIS — N186 End stage renal disease: Secondary | ICD-10-CM | POA: Diagnosis not present

## 2017-12-16 DIAGNOSIS — Z992 Dependence on renal dialysis: Secondary | ICD-10-CM | POA: Diagnosis not present

## 2017-12-16 DIAGNOSIS — N186 End stage renal disease: Secondary | ICD-10-CM | POA: Diagnosis not present

## 2017-12-16 DIAGNOSIS — I158 Other secondary hypertension: Secondary | ICD-10-CM | POA: Diagnosis not present

## 2017-12-18 DIAGNOSIS — D509 Iron deficiency anemia, unspecified: Secondary | ICD-10-CM | POA: Diagnosis not present

## 2017-12-18 DIAGNOSIS — N186 End stage renal disease: Secondary | ICD-10-CM | POA: Diagnosis not present

## 2017-12-18 DIAGNOSIS — E8770 Fluid overload, unspecified: Secondary | ICD-10-CM | POA: Diagnosis not present

## 2017-12-18 DIAGNOSIS — N2581 Secondary hyperparathyroidism of renal origin: Secondary | ICD-10-CM | POA: Diagnosis not present

## 2017-12-18 DIAGNOSIS — I4891 Unspecified atrial fibrillation: Secondary | ICD-10-CM | POA: Diagnosis not present

## 2017-12-18 DIAGNOSIS — D631 Anemia in chronic kidney disease: Secondary | ICD-10-CM | POA: Diagnosis not present

## 2017-12-20 DIAGNOSIS — N186 End stage renal disease: Secondary | ICD-10-CM | POA: Diagnosis not present

## 2017-12-20 DIAGNOSIS — E8770 Fluid overload, unspecified: Secondary | ICD-10-CM | POA: Diagnosis not present

## 2017-12-20 DIAGNOSIS — D509 Iron deficiency anemia, unspecified: Secondary | ICD-10-CM | POA: Diagnosis not present

## 2017-12-20 DIAGNOSIS — I4891 Unspecified atrial fibrillation: Secondary | ICD-10-CM | POA: Diagnosis not present

## 2017-12-20 DIAGNOSIS — D631 Anemia in chronic kidney disease: Secondary | ICD-10-CM | POA: Diagnosis not present

## 2017-12-20 DIAGNOSIS — N2581 Secondary hyperparathyroidism of renal origin: Secondary | ICD-10-CM | POA: Diagnosis not present

## 2017-12-22 DIAGNOSIS — E8770 Fluid overload, unspecified: Secondary | ICD-10-CM | POA: Diagnosis not present

## 2017-12-22 DIAGNOSIS — D509 Iron deficiency anemia, unspecified: Secondary | ICD-10-CM | POA: Diagnosis not present

## 2017-12-22 DIAGNOSIS — N2581 Secondary hyperparathyroidism of renal origin: Secondary | ICD-10-CM | POA: Diagnosis not present

## 2017-12-22 DIAGNOSIS — N186 End stage renal disease: Secondary | ICD-10-CM | POA: Diagnosis not present

## 2017-12-22 DIAGNOSIS — D631 Anemia in chronic kidney disease: Secondary | ICD-10-CM | POA: Diagnosis not present

## 2017-12-22 DIAGNOSIS — I4891 Unspecified atrial fibrillation: Secondary | ICD-10-CM | POA: Diagnosis not present

## 2017-12-23 ENCOUNTER — Emergency Department (HOSPITAL_BASED_OUTPATIENT_CLINIC_OR_DEPARTMENT_OTHER)
Admission: EM | Admit: 2017-12-23 | Discharge: 2017-12-23 | Disposition: A | Discharge status: Home / Self Care | Payer: Medicare Other | Attending: Emergency Medicine | Admitting: Emergency Medicine

## 2017-12-23 ENCOUNTER — Other Ambulatory Visit: Payer: Self-pay

## 2017-12-23 ENCOUNTER — Encounter (HOSPITAL_BASED_OUTPATIENT_CLINIC_OR_DEPARTMENT_OTHER): Payer: Self-pay | Admitting: Emergency Medicine

## 2017-12-23 ENCOUNTER — Emergency Department (HOSPITAL_BASED_OUTPATIENT_CLINIC_OR_DEPARTMENT_OTHER): Payer: Medicare Other

## 2017-12-23 DIAGNOSIS — Z96643 Presence of artificial hip joint, bilateral: Secondary | ICD-10-CM | POA: Insufficient documentation

## 2017-12-23 DIAGNOSIS — W19XXXA Unspecified fall, initial encounter: Secondary | ICD-10-CM | POA: Diagnosis not present

## 2017-12-23 DIAGNOSIS — Z96651 Presence of right artificial knee joint: Secondary | ICD-10-CM | POA: Diagnosis not present

## 2017-12-23 DIAGNOSIS — Z79899 Other long term (current) drug therapy: Secondary | ICD-10-CM | POA: Diagnosis not present

## 2017-12-23 DIAGNOSIS — Z7901 Long term (current) use of anticoagulants: Secondary | ICD-10-CM | POA: Diagnosis not present

## 2017-12-23 DIAGNOSIS — N2581 Secondary hyperparathyroidism of renal origin: Secondary | ICD-10-CM | POA: Diagnosis not present

## 2017-12-23 DIAGNOSIS — I5042 Chronic combined systolic (congestive) and diastolic (congestive) heart failure: Secondary | ICD-10-CM | POA: Insufficient documentation

## 2017-12-23 DIAGNOSIS — S91209A Unspecified open wound of unspecified toe(s) with damage to nail, initial encounter: Secondary | ICD-10-CM

## 2017-12-23 DIAGNOSIS — Y999 Unspecified external cause status: Secondary | ICD-10-CM | POA: Insufficient documentation

## 2017-12-23 DIAGNOSIS — S91201A Unspecified open wound of right great toe with damage to nail, initial encounter: Secondary | ICD-10-CM | POA: Diagnosis not present

## 2017-12-23 DIAGNOSIS — Z992 Dependence on renal dialysis: Secondary | ICD-10-CM | POA: Diagnosis not present

## 2017-12-23 DIAGNOSIS — E8770 Fluid overload, unspecified: Secondary | ICD-10-CM | POA: Diagnosis not present

## 2017-12-23 DIAGNOSIS — Y9301 Activity, walking, marching and hiking: Secondary | ICD-10-CM | POA: Diagnosis not present

## 2017-12-23 DIAGNOSIS — N186 End stage renal disease: Secondary | ICD-10-CM | POA: Diagnosis not present

## 2017-12-23 DIAGNOSIS — S99921A Unspecified injury of right foot, initial encounter: Secondary | ICD-10-CM | POA: Diagnosis not present

## 2017-12-23 DIAGNOSIS — R52 Pain, unspecified: Secondary | ICD-10-CM | POA: Diagnosis not present

## 2017-12-23 DIAGNOSIS — M79671 Pain in right foot: Secondary | ICD-10-CM | POA: Diagnosis not present

## 2017-12-23 DIAGNOSIS — I4891 Unspecified atrial fibrillation: Secondary | ICD-10-CM | POA: Diagnosis not present

## 2017-12-23 DIAGNOSIS — W228XXA Striking against or struck by other objects, initial encounter: Secondary | ICD-10-CM | POA: Insufficient documentation

## 2017-12-23 DIAGNOSIS — D509 Iron deficiency anemia, unspecified: Secondary | ICD-10-CM | POA: Diagnosis not present

## 2017-12-23 DIAGNOSIS — R58 Hemorrhage, not elsewhere classified: Secondary | ICD-10-CM | POA: Diagnosis not present

## 2017-12-23 DIAGNOSIS — I132 Hypertensive heart and chronic kidney disease with heart failure and with stage 5 chronic kidney disease, or end stage renal disease: Secondary | ICD-10-CM | POA: Diagnosis not present

## 2017-12-23 DIAGNOSIS — Y929 Unspecified place or not applicable: Secondary | ICD-10-CM | POA: Diagnosis not present

## 2017-12-23 DIAGNOSIS — E1122 Type 2 diabetes mellitus with diabetic chronic kidney disease: Secondary | ICD-10-CM | POA: Diagnosis not present

## 2017-12-23 DIAGNOSIS — D631 Anemia in chronic kidney disease: Secondary | ICD-10-CM | POA: Diagnosis not present

## 2017-12-23 LAB — CBC WITH DIFFERENTIAL/PLATELET
Basophils Absolute: 0 10*3/uL (ref 0.0–0.1)
Basophils Relative: 0 %
Eosinophils Absolute: 0.2 10*3/uL (ref 0.0–0.7)
Eosinophils Relative: 5 %
HEMATOCRIT: 34 % — AB (ref 39.0–52.0)
HEMOGLOBIN: 11.1 g/dL — AB (ref 13.0–17.0)
LYMPHS ABS: 1.1 10*3/uL (ref 0.7–4.0)
LYMPHS PCT: 20 %
MCH: 32.1 pg (ref 26.0–34.0)
MCHC: 32.6 g/dL (ref 30.0–36.0)
MCV: 98.3 fL (ref 78.0–100.0)
MONOS PCT: 10 %
Monocytes Absolute: 0.6 10*3/uL (ref 0.1–1.0)
NEUTROS ABS: 3.5 10*3/uL (ref 1.7–7.7)
NEUTROS PCT: 65 %
Platelets: 164 10*3/uL (ref 150–400)
RBC: 3.46 MIL/uL — AB (ref 4.22–5.81)
RDW: 16.5 % — ABNORMAL HIGH (ref 11.5–15.5)
WBC: 5.4 10*3/uL (ref 4.0–10.5)

## 2017-12-23 LAB — BASIC METABOLIC PANEL
Anion gap: 14 (ref 5–15)
BUN: 35 mg/dL — AB (ref 6–20)
CHLORIDE: 98 mmol/L — AB (ref 101–111)
CO2: 26 mmol/L (ref 22–32)
CREATININE: 7.08 mg/dL — AB (ref 0.61–1.24)
Calcium: 9.4 mg/dL (ref 8.9–10.3)
GFR calc non Af Amer: 7 mL/min — ABNORMAL LOW (ref >60)
GFR, EST AFRICAN AMERICAN: 8 mL/min — AB (ref >60)
Glucose, Bld: 116 mg/dL — ABNORMAL HIGH (ref 65–99)
POTASSIUM: 3.8 mmol/L (ref 3.5–5.1)
Sodium: 138 mmol/L (ref 135–145)

## 2017-12-23 LAB — PROTIME-INR
INR: 1.36
Prothrombin Time: 16.6 seconds — ABNORMAL HIGH (ref 11.4–15.2)

## 2017-12-23 MED ORDER — LIDOCAINE HCL (PF) 1 % IJ SOLN
10.0000 mL | Freq: Once | INTRAMUSCULAR | Status: AC
  Administered 2017-12-23: 10 mL
  Filled 2017-12-23: qty 10

## 2017-12-23 NOTE — Discharge Instructions (Signed)
Follow-up with your primary care doctor or your podiatrist in about a week or so to make sure the wound seems to be healing properly.  You want to apply a nonstick dressing or some antibiotic ointment to the cuticle area daily and then cover it up with a bulky dressing.

## 2017-12-23 NOTE — ED Provider Notes (Signed)
[facility] EMERGENCY DEPARTMENT Provider Note   CSN: [id number] Arrival date & time: 12/23/17  0610     History   Chief Complaint Chief Complaint  Patient presents with  . Toe Injury    HPI Bruce Cole is a 77 y.o. male.  HPI Patient presents to the emergency room for evaluation of an injury to his right big toe toenail earlier this morning.  Patient states he was walking and stubbed his toe and avulsed his big toe toenail off.  Patient states it was flipped all the way backwards.  Patient denies any other injuries.  No numbness or weakness.  Patient has noticed bleeding from his toenail since the injury. Past Medical History:  Diagnosis Date  . Acute blood loss anemia   . Atrial fibrillation (HCC)    Chronic  . Chronic combined systolic and diastolic heart failure ([place])   . Colon polyp 2000  . Dysrhythmia    hx  . ESRD on hemodialysis ([place])    [place]  . Essential hypertension   . Gastritis and gastroduodenitis   . GI bleed   . Gout   . Heart murmur   . History of blood transfusion   . History of kidney stones   . Nephrolithiasis   . OSA (obstructive sleep apnea) 09/02/2013    IMPRESSION :  1. Mild obstructive sleep apnea with hypopneas causing sleep fragmentation and moderate oxygen desaturation.  2. Short runs of nonsustained VT were noted. His beta blocker may need to be titrated 3. Significant PLM's were noted, the PLM arousal index was low. Please correlate with clinical history of restless leg syndrome.  4. Sleep efficiency was poor.  RECOMMENDATION:  1. Treatment options for this degree of sleep disordered breathing include weight loss and positional therapy to avoid supine sleep 2. Consider titrating beta blocker further, defer to cardiologist 3. Patient should be cautioned against driving when sleepy.They should be asked to avoid medications with sedative side effects     . Osteoarthritis of right hip   . Pneumonia    hx 30 yrs  ago  . Primary osteoarthritis of right hip   . Rheumatoid arthritis ([place])   . Thrombocytopenia [facility])     Patient Active Problem List   Diagnosis Date Noted  . High risk medication use 03/17/2017  . Hyperkalemia 08/01/2016  . Aftercare following surgery of the circulatory system 06/17/2016  . Status post total replacement of right hip 03/22/2016  . ESRD on dialysis ([place]) 11/27/2015  . OSA (obstructive sleep apnea) 09/02/2013  . Chronic combined systolic and diastolic heart failure ([place]) 03/16/2013  . Allergic rhinitis 12/18/2012  . Long term current use of anticoagulant therapy 11/25/2010  . Genital herpes 05/31/2010  . DM (diabetes mellitus), type 2 with renal complications ([place]) 08/65/7846  . ERECTILE DYSFUNCTION, ORGANIC 09/21/2009  . Osteoarthritis 09/21/2009  . PERSONAL HX COLONIC POLYPS 08/25/2009  . Gout 07/22/2009  . Essential hypertension 07/22/2009  . ATRIAL FIBRILLATION 07/22/2009  . Rheumatoid arthritis ([place]) 07/22/2009  . NEPHROLITHIASIS, HX OF 07/22/2009    Past Surgical History:  Procedure Laterality Date  . AV FISTULA PLACEMENT Left 08/26/2015   Procedure: LEFT RADIOCEPHALIC FISTULA CREATION;  Surgeon: Rosetta Posner, MD;  Location: [place];  Service: Vascular;  Laterality: Left;  . AV FISTULA PLACEMENT Left 11/23/2015   Procedure: ARTERIOVENOUS (AV) FISTULA CREATION;  Surgeon: Rosetta Posner, MD;  Location: [place];  Service: Vascular;  Laterality: Left;  . BACK SURGERY  x2- discectomy  . CHOLECYSTECTOMY  1994  . CYSTOSCOPY/RETROGRADE/URETEROSCOPY/STONE EXTRACTION WITH BASKET    . ESOPHAGOGASTRODUODENOSCOPY N/A 11/13/2015   Procedure: ESOPHAGOGASTRODUODENOSCOPY (EGD);  Surgeon: Irene Shipper, MD;  Location: [facility] ENDOSCOPY;  Service: Endoscopy;  Laterality: N/A;  . INSERTION OF DIALYSIS CATHETER Left 11/23/2015   Procedure: INSERTION OF DIALYSIS CATHETER;  Surgeon: Rosetta Posner, MD;  Location: [place];  Service: Vascular;  Laterality: Left;  . IR GENERIC HISTORICAL Left  08/04/2016   IR DIALY SHUNT INTRO NEEDLE/INTRACATH INITIAL W/IMG LEFT 08/04/2016 Markus Daft, MD [facility]  . IR GENERIC HISTORICAL  08/04/2016   IR US GUIDE VASC ACCESS LEFT 08/04/2016 Markus Daft, MD [facility]  . JOINT REPLACEMENT     Total L-Hip replacement, Right Knee 10/20/09  . LEFT AND RIGHT HEART CATHETERIZATION WITH CORONARY ANGIOGRAM N/A 02/22/2013   Procedure: LEFT AND RIGHT HEART CATHETERIZATION WITH CORONARY ANGIOGRAM;  Surgeon: Jolaine Artist, MD;  Location: [facility] CATH LAB;  Service: Cardiovascular;  Laterality: N/A;  . LITHOTRIPSY  90's  . PERIPHERAL VASCULAR CATHETERIZATION Left 11/19/2015   Procedure: A/V/Fistulagram;  Surgeon: Conrad Napanoch, MD;  Location: [place] CV LAB;  Service: Cardiovascular;  Laterality: Left;  . PERIPHERAL VASCULAR CATHETERIZATION Left 07/21/2016   Procedure: A/V Fistulagram;  Surgeon: Conrad Lavon, MD;  Location: [place] CV LAB;  Service: Cardiovascular;  Laterality: Left;  arm  . REVISON OF ARTERIOVENOUS FISTULA Left 05/30/2016   Procedure: REVISION LEFT UPPER ARM FISTULA;  Surgeon: Conrad , MD;  Location: [place];  Service: Vascular;  Laterality: Left;  . REVISON OF ARTERIOVENOUS FISTULA Left 07/22/2016   Procedure: REVISON OF BASILIC VEIN TRANSPOSITION ANASTOMOSIS;  Surgeon: Rosetta Posner, MD;  Location: [place];  Service: Vascular;  Laterality: Left;  . SPINE SURGERY     x 2  . TOTAL HIP ARTHROPLASTY Right 03/22/2016   Procedure: RIGHT TOTAL HIP ARTHROPLASTY ANTERIOR APPROACH;  Surgeon: Mcarthur Rossetti, MD;  Location: [place];  Service: Orthopedics;  Laterality: Right;        Home Medications    Prior to Admission medications   Medication Sig Start Date End Date Taking? Authorizing Provider  acetaminophen (TYLENOL) 500 MG tablet Take 500 mg by mouth every 6 (six) hours as needed for mild pain.    [provider]  albuterol (PROVENTIL) (2.5 MG/3ML) 0.083% nebulizer solution Take 2.5 mg by nebulization every 4 (four) hours as  needed for shortness of breath. Reported on 09/25/2015    [provider]  atorvastatin (LIPITOR) 10 MG tablet TAKE 1 TABLET(10 MG) BY MOUTH DAILY 09/15/17   Debbrah Alar, NP  B Complex-C-Folic Acid (RENA-VITE RX) 1 MG TABS Take 1 tablet by mouth at bedtime.  02/18/16   [provider]  colchicine 0.6 MG tablet Take 1/2 tablet by mouth once as needed for gout flare. Call if no improvement 02/20/17   Debbrah Alar, NP  fluticasone (FLONASE) 50 MCG/ACT nasal spray INSTILL 2 SPRAYS INTO BOTH NOSTRILS EVERY DAY 10/03/17   Debbrah Alar, NP  furosemide (LASIX) 40 MG tablet TAKE 1 TABLET(40 MG) BY MOUTH TWICE DAILY 10/03/17   Debbrah Alar, NP  gabapentin (NEURONTIN) 300 MG capsule TAKE 1 CAPSULE(300 MG) BY MOUTH AT BEDTIME 07/19/17   Debbrah Alar, NP  hydroxychloroquine (PLAQUENIL) 200 MG tablet TAKE 1 TABLET(200 MG) BY MOUTH DAILY 01/23/17   Panwala, Naitik, PA-C  RENVELA 800 MG tablet Take 3 tablets (2,400 mg total) by mouth 3 (three) times daily with meals. 07/23/16  Lavina Hamman, MD  warfarin (COUMADIN) 7.5 MG tablet TAKE ONE TABLET BY MOUTH DAILY AS DIRECTED BY COUMADIN CLINIC 10/20/17   Debbrah Alar, NP    Family History Family History  Problem Relation Age of Onset  . Hypertension Mother   . Arthritis Mother        ?RA  . Hypertension Father     Social History Social History   Tobacco Use  . Smoking status: Never Smoker  . Smokeless tobacco: Never Used  Substance Use Topics  . Alcohol use: No    Comment: occasional  . Drug use: No     Allergies   Ace inhibitors   Review of Systems Review of Systems  All other systems reviewed and are negative.    Physical Exam Updated Vital Signs BP (!) 107/54 (BP Location: Right Arm)   Pulse 60   Temp 98.3 F (36.8 C) (Oral)   Resp 16   Ht 1.88 m (6\' 2" )   Wt 113.4 kg (250 lb)   SpO2 95%   BMI 32.10 kg/m   Physical Exam  Constitutional: He appears well-developed and  well-nourished. No distress.  HENT:  Head: Normocephalic and atraumatic.  Right Ear: External ear normal.  Left Ear: External ear normal.  Eyes: Conjunctivae are normal. Right eye exhibits no discharge. Left eye exhibits no discharge. No scleral icterus.  Neck: Neck supple. No tracheal deviation present.  Cardiovascular: Normal rate.  Pulmonary/Chest: Effort normal. No stridor. No respiratory distress.  Abdominal: He exhibits no distension.  Musculoskeletal: He exhibits no edema.  Hypertrophic right big toenail, bleeding at the base and around the cuticles, toenail almost completely avulsed off  Neurological: He is alert. Cranial nerve deficit: no gross deficits.  Skin: Skin is warm and dry. No rash noted.  Psychiatric: He has a normal mood and affect.  Nursing note and vitals reviewed.    ED Treatments / Results  Labs (all labs ordered are listed, but only abnormal results are displayed) Labs Reviewed  CBC WITH DIFFERENTIAL/PLATELET - Abnormal; Notable for the following components:      Result Value   RBC 3.46 (*)    Hemoglobin 11.1 (*)    HCT 34.0 (*)    RDW 16.5 (*)    All other components within normal limits  BASIC METABOLIC PANEL - Abnormal; Notable for the following components:   Chloride 98 (*)    Glucose, Bld 116 (*)    BUN 35 (*)    Creatinine, Ser 7.08 (*)    GFR calc non Af Amer 7 (*)    GFR calc Af Amer 8 (*)    All other components within normal limits  PROTIME-INR - Abnormal; Notable for the following components:   Prothrombin Time 16.6 (*)    All other components within normal limits    EKG None  Radiology Dg Foot Complete Right  Result Date: 12/23/2017 CLINICAL DATA:  Pain following fall EXAM: RIGHT FOOT COMPLETE - 3+ VIEW COMPARISON:  None. FINDINGS: Frontal, oblique, and lateral views were obtained. There is no demonstrable fracture or dislocation. There is marked narrowing and bony overgrowth of the first MTP joint. There is mild narrowing of all  DIP joints. There is spurring in the dorsal midfoot. There are spurs arising from the posterior and inferior calcaneus. No erosive change. There are foci of arterial vascular calcification. IMPRESSION: Osteoarthritic change in multiple joints with osteoarthritic change marked at the first MTP joint. No fracture or dislocation. There are calcaneal spurs. There is  extensive arterial vascular calcification. Electronically Signed   By: Lowella Grip III M.D.   On: 12/23/2017 07:05    Procedures .Nail Removal Date/Time: 12/23/2017 7:53 AM Performed by: Dorie Rank, MD Authorized by: Dorie Rank, MD   Consent:    Consent obtained:  Verbal   Consent given by:  Patient   Risks discussed:  Bleeding, infection, pain and permanent nail deformity   Alternatives discussed:  No treatment Location:    Foot:  R big toe Pre-procedure details:    Skin preparation:  Betadine   Preparation: Patient was prepped and draped in the usual sterile fashion   Anesthesia (see MAR for exact dosages):    Anesthesia method:  Local infiltration   Local anesthetic:  Lidocaine 1% w/o epi Nail Removal:    Nail removed:  Complete   Nail bed repaired: no   Post-procedure details:    Dressing:  Xeroform gauze and 4x4 sterile gauze   Patient tolerance of procedure:  Tolerated well, no immediate complications Comments:     Hypertrophic toenail.  Unable to replace and tack down.  Removed without difficulty.  Nail was only adherent at the proximal cuticle.   (including critical care time)  Medications Ordered in ED Medications  lidocaine (PF) (XYLOCAINE) 1 % injection 10 mL (10 mLs Other Given by Other 12/23/17 0720)     Initial Impression / Assessment and Plan / ED Course  I have reviewed the triage vital signs and the nursing notes.  Pertinent labs & imaging results that were available during my care of the patient were reviewed by me and considered in my medical decision making (see chart for details).   Patient  presented to the emergency room for evaluation after a toenail injury.  No evidence of nailbed laceration.  No fracture.  Toenail was almost completely avulsed.   Not amenable to replacing the toenail and trying to tack it down because of the hypertrophic and thickened nature of the toenail.  Discussed that it will take a long period of time for his toenail to grow back. Final Clinical Impressions(s) / ED Diagnoses   Final diagnoses:  Toenail avulsion, initial encounter    ED Discharge Orders    None       Dorie Rank, MD 12/23/17 [phone]

## 2017-12-23 NOTE — ED Triage Notes (Signed)
Pt brought to ED by [facility] after falling on his bathroom and get hurt on his right great toe nail. Per pt he ripped his nail off. Pt is on Coumadin and toe still have some bleeding. Pt c/o 6/10 pain at this time.

## 2017-12-25 DIAGNOSIS — N2581 Secondary hyperparathyroidism of renal origin: Secondary | ICD-10-CM | POA: Diagnosis not present

## 2017-12-25 DIAGNOSIS — D631 Anemia in chronic kidney disease: Secondary | ICD-10-CM | POA: Diagnosis not present

## 2017-12-25 DIAGNOSIS — I4891 Unspecified atrial fibrillation: Secondary | ICD-10-CM | POA: Diagnosis not present

## 2017-12-25 DIAGNOSIS — D509 Iron deficiency anemia, unspecified: Secondary | ICD-10-CM | POA: Diagnosis not present

## 2017-12-25 DIAGNOSIS — N186 End stage renal disease: Secondary | ICD-10-CM | POA: Diagnosis not present

## 2017-12-25 DIAGNOSIS — E8770 Fluid overload, unspecified: Secondary | ICD-10-CM | POA: Diagnosis not present

## 2017-12-27 DIAGNOSIS — N186 End stage renal disease: Secondary | ICD-10-CM | POA: Diagnosis not present

## 2017-12-27 DIAGNOSIS — D631 Anemia in chronic kidney disease: Secondary | ICD-10-CM | POA: Diagnosis not present

## 2017-12-27 DIAGNOSIS — N2581 Secondary hyperparathyroidism of renal origin: Secondary | ICD-10-CM | POA: Diagnosis not present

## 2017-12-27 DIAGNOSIS — D509 Iron deficiency anemia, unspecified: Secondary | ICD-10-CM | POA: Diagnosis not present

## 2017-12-27 DIAGNOSIS — E8770 Fluid overload, unspecified: Secondary | ICD-10-CM | POA: Diagnosis not present

## 2017-12-27 DIAGNOSIS — I4891 Unspecified atrial fibrillation: Secondary | ICD-10-CM | POA: Diagnosis not present

## 2017-12-29 DIAGNOSIS — N2581 Secondary hyperparathyroidism of renal origin: Secondary | ICD-10-CM | POA: Diagnosis not present

## 2017-12-29 DIAGNOSIS — N186 End stage renal disease: Secondary | ICD-10-CM | POA: Diagnosis not present

## 2017-12-29 DIAGNOSIS — D631 Anemia in chronic kidney disease: Secondary | ICD-10-CM | POA: Diagnosis not present

## 2017-12-29 DIAGNOSIS — D509 Iron deficiency anemia, unspecified: Secondary | ICD-10-CM | POA: Diagnosis not present

## 2017-12-29 DIAGNOSIS — I4891 Unspecified atrial fibrillation: Secondary | ICD-10-CM | POA: Diagnosis not present

## 2017-12-29 DIAGNOSIS — E8770 Fluid overload, unspecified: Secondary | ICD-10-CM | POA: Diagnosis not present

## 2017-12-30 DIAGNOSIS — I4891 Unspecified atrial fibrillation: Secondary | ICD-10-CM | POA: Diagnosis not present

## 2017-12-30 DIAGNOSIS — D631 Anemia in chronic kidney disease: Secondary | ICD-10-CM | POA: Diagnosis not present

## 2017-12-30 DIAGNOSIS — E8770 Fluid overload, unspecified: Secondary | ICD-10-CM | POA: Diagnosis not present

## 2017-12-30 DIAGNOSIS — N186 End stage renal disease: Secondary | ICD-10-CM | POA: Diagnosis not present

## 2017-12-30 DIAGNOSIS — D509 Iron deficiency anemia, unspecified: Secondary | ICD-10-CM | POA: Diagnosis not present

## 2017-12-30 DIAGNOSIS — N2581 Secondary hyperparathyroidism of renal origin: Secondary | ICD-10-CM | POA: Diagnosis not present

## 2018-01-01 DIAGNOSIS — N2581 Secondary hyperparathyroidism of renal origin: Secondary | ICD-10-CM | POA: Diagnosis not present

## 2018-01-01 DIAGNOSIS — N186 End stage renal disease: Secondary | ICD-10-CM | POA: Diagnosis not present

## 2018-01-01 DIAGNOSIS — I4891 Unspecified atrial fibrillation: Secondary | ICD-10-CM | POA: Diagnosis not present

## 2018-01-01 DIAGNOSIS — D509 Iron deficiency anemia, unspecified: Secondary | ICD-10-CM | POA: Diagnosis not present

## 2018-01-01 DIAGNOSIS — D631 Anemia in chronic kidney disease: Secondary | ICD-10-CM | POA: Diagnosis not present

## 2018-01-01 DIAGNOSIS — E8770 Fluid overload, unspecified: Secondary | ICD-10-CM | POA: Diagnosis not present

## 2018-01-02 ENCOUNTER — Encounter: Payer: Self-pay | Admitting: Family

## 2018-01-02 ENCOUNTER — Ambulatory Visit (INDEPENDENT_AMBULATORY_CARE_PROVIDER_SITE_OTHER): Payer: Medicare Other | Admitting: Family

## 2018-01-02 VITALS — BP 99/64 | HR 55 | Temp 98.0°F | Resp 16 | Ht 74.0 in | Wt 276.8 lb

## 2018-01-02 DIAGNOSIS — I10 Essential (primary) hypertension: Secondary | ICD-10-CM | POA: Diagnosis not present

## 2018-01-02 DIAGNOSIS — N186 End stage renal disease: Secondary | ICD-10-CM | POA: Diagnosis not present

## 2018-01-02 DIAGNOSIS — E118 Type 2 diabetes mellitus with unspecified complications: Secondary | ICD-10-CM | POA: Diagnosis not present

## 2018-01-02 DIAGNOSIS — I4891 Unspecified atrial fibrillation: Secondary | ICD-10-CM | POA: Diagnosis not present

## 2018-01-02 LAB — HEMOGLOBIN A1C: HEMOGLOBIN A1C: 6.4 % (ref 4.6–6.5)

## 2018-01-02 LAB — POCT INR: INR: 1.8 — AB (ref 2.0–3.0)

## 2018-01-02 MED ORDER — WARFARIN SODIUM 7.5 MG PO TABS
ORAL_TABLET | ORAL | 0 refills | Status: DC
Start: 2018-01-02 — End: 2018-04-27

## 2018-01-02 MED ORDER — FLUTICASONE PROPIONATE 50 MCG/ACT NA SUSP: NASAL | 5 refills | Status: DC

## 2018-01-02 MED ORDER — WARFARIN SODIUM 7.5 MG PO TABS: ORAL_TABLET | ORAL | 0 refills | Status: DC

## 2018-01-02 MED ORDER — COLCHICINE 0.6 MG PO TABS: ORAL_TABLET | ORAL | 0 refills | Status: AC

## 2018-01-02 MED ORDER — GABAPENTIN 300 MG PO CAPS: ORAL_CAPSULE | ORAL | 1 refills | Status: DC

## 2018-01-02 MED ORDER — ATORVASTATIN CALCIUM 10 MG PO TABS: ORAL_TABLET | ORAL | 1 refills | Status: DC

## 2018-01-02 NOTE — Patient Instructions (Signed)
Please change your coumadin to full tab every day except 1/2 tab on Monday and Friday. Your INR will need to be rechecked at dialysis in 1 week. Follow up in 3 months.

## 2018-01-02 NOTE — Progress Notes (Signed)
Subjective:    Patient ID: Bruce Cole, male    DOB: 1940/07/23, 77 y.o.   MRN: [id number]  HPI   Hyperlipidemia- maintained on lipitor.   Lab Results  Component Value Date   CHOL 129 10/03/2017   HDL 66.30 10/03/2017   LDLCALC 51 10/03/2017   TRIG 57.0 10/03/2017   CHOLHDL 2 10/03/2017   Hypertension-he is no longer on any antihypertensive.  He does continue Lasix 40 mg bid.  BP Readings from Last 3 Encounters:  01/02/18 99/64  12/23/17 109/89  12/12/17 (!) 97/47   DM2-he is maintained on diabetic diet. Lab Results  Component Value Date   HGBA1C 6.6 (H) 10/03/2017   HGBA1C 5.6 02/20/2017   HGBA1C 6.3 11/14/2016   Lab Results  Component Value Date   MICROALBUR 20.7 (H) 12/16/2014   LDLCALC 51 10/03/2017   CREATININE 7.08 (H) 12/23/2017    Back on the eighth.  He was seen in the emergency department.  He reports that he was sitting on a stool in the stool "gave way."  He reports that his toenail was torn off during the fall.  He is applying antibiotic ointment and changing the dressing daily.  Review of Systems See HPI  Past Medical History:  Diagnosis Date  . Acute blood loss anemia   . Atrial fibrillation (HCC)    Chronic  . Chronic combined systolic and diastolic heart failure ([place])   . Colon polyp 2000  . Dysrhythmia    hx  . ESRD on hemodialysis ([place])    [place]  . Essential hypertension   . Gastritis and gastroduodenitis   . GI bleed   . Gout   . Heart murmur   . History of blood transfusion   . History of kidney stones   . Nephrolithiasis   . OSA (obstructive sleep apnea) 09/02/2013    IMPRESSION :  1. Mild obstructive sleep apnea with hypopneas causing sleep fragmentation and moderate oxygen desaturation.  2. Short runs of nonsustained VT were noted. His beta blocker may need to be titrated 3. Significant PLM's were noted, the PLM arousal index was low. Please correlate with clinical history of restless leg syndrome.  4.  Sleep efficiency was poor.  RECOMMENDATION:  1. Treatment options for this degree of sleep disordered breathing include weight loss and positional therapy to avoid supine sleep 2. Consider titrating beta blocker further, defer to cardiologist 3. Patient should be cautioned against driving when sleepy.They should be asked to avoid medications with sedative side effects     . Osteoarthritis of right hip   . Pneumonia    hx 30 yrs ago  . Primary osteoarthritis of right hip   . Rheumatoid arthritis ([place])   . Thrombocytopenia ([place])      Social History   Socioeconomic History  . Marital status: Single    Spouse name: Not on file  . Number of children: 3  . Years of education: Not on file  . Highest education level: Not on file  Occupational History  . Occupation: retired Personnel officer: RETIRED  Social Needs  . Financial resource strain: Not on file  . Food insecurity:    Worry: Not on file    Inability: Not on file  . Transportation needs:    Medical: Not on file    Non-medical: Not on file  Tobacco Use  . Smoking status: Never Smoker  . Smokeless tobacco: Never Used  Substance and Sexual  Activity  . Alcohol use: No    Comment: occasional  . Drug use: No  . Sexual activity: Not on file  Lifestyle  . Physical activity:    Days per week: Not on file    Minutes per session: Not on file  . Stress: Not on file  Relationships  . Social connections:    Talks on phone: Not on file    Gets together: Not on file    Attends religious service: Not on file    Active member of club or organization: Not on file    Attends meetings of clubs or organizations: Not on file    Relationship status: Not on file  . Intimate partner violence:    Fear of current or ex partner: Not on file    Emotionally abused: Not on file    Physically abused: Not on file    Forced sexual activity: Not on file  Other Topics Concern  . Not on file  Social History Narrative   Former [employer]   Admitted to [facility] 11/25/15   Widowed   Never smoked   FULL CODE    Past Surgical History:  Procedure Laterality Date  . AV FISTULA PLACEMENT Left 08/26/2015   Procedure: LEFT RADIOCEPHALIC FISTULA CREATION;  Surgeon: Rosetta Posner, MD;  Location: [facility];  Service: Vascular;  Laterality: Left;  . AV FISTULA PLACEMENT Left 11/23/2015   Procedure: ARTERIOVENOUS (AV) FISTULA CREATION;  Surgeon: Rosetta Posner, MD;  Location: [facility];  Service: Vascular;  Laterality: Left;  . BACK SURGERY     x2- discectomy  . CHOLECYSTECTOMY  1994  . CYSTOSCOPY/RETROGRADE/URETEROSCOPY/STONE EXTRACTION WITH BASKET    . ESOPHAGOGASTRODUODENOSCOPY N/A 11/13/2015   Procedure: ESOPHAGOGASTRODUODENOSCOPY (EGD);  Surgeon: Irene Shipper, MD;  Location[facility] ENDOSCOPY;  Service: Endoscopy;  Laterality: N/A;  . INSERTION OF DIALYSIS CATHETER Left 11/23/2015   Procedure: INSERTION OF DIALYSIS CATHETER;  Surgeon: Rosetta Posner, MD;  Location: [facility];  Service: Vascular;  Laterality: Left;  . IR GENERIC HISTORICAL Left 08/04/2016   IR DIALY SHUNT INTRO NEEDLE/INTRACATH INITIAL W/IMG LEFT 08/04/2016 Markus Daft, MD [facility]-INTERV RAD  . IR GENERIC HISTORICAL  08/04/2016   IR US GUIDE VASC ACCESS LEFT 08/04/2016 Markus Daft, MD [facility]INTER[facility]  . JOINT REPLACEMENT     Total L-Hip replacement, Right Knee 10/20/09  . LEFT AND RIGHT HEART CATHETERIZATION WITH CORONARY ANGIOGRAM N/A 02/22/2013   Procedure: LEFT AND RIGHT HEART CATHETERIZATION WITH CORONARY ANGIOGRAM;  Surgeon: Jolaine Artist, MD;  Location: [facility] CATH LAB;  Service: Cardiovascular;  Laterality: N/A;  . LITHOTRIPSY  90's  . PERIPHERAL VASCULAR CATHETERIZATION Left 11/19/2015   Procedure: A/V/Fistulagram;  Surgeon: Conrad Ovilla, MD;  Location: [place] CV LAB;  Service: Cardiovascular;  Laterality: Left;  . PERIPHERAL VASCULAR CATHETERIZATION Left 07/21/2016   Procedure: A/V Fistulagram;  Surgeon: Conrad Sanatoga, MD;  Location: [place] CV LAB;  Service:  Cardiovascular;  Laterality: Left;  arm  . REVISON OF ARTERIOVENOUS FISTULA Left 05/30/2016   Procedure: REVISION LEFT UPPER ARM FISTULA;  Surgeon: Conrad Crab Orchard, MD;  Location: [place];  Service: Vascular;  Laterality: Left;  . REVISON OF ARTERIOVENOUS FISTULA Left 07/22/2016   Procedure: REVISON OF BASILIC VEIN TRANSPOSITION ANASTOMOSIS;  Surgeon: Rosetta Posner, MD;  Location: [place];  Service: Vascular;  Laterality: Left;  . SPINE SURGERY     x 2  . TOTAL HIP ARTHROPLASTY Right 03/22/2016   Procedure:  RIGHT TOTAL HIP ARTHROPLASTY ANTERIOR APPROACH;  Surgeon: Mcarthur Rossetti, MD;  Location: [facility];  Service: Orthopedics;  Laterality: Right;    Family History  Problem Relation Age of Onset  . Hypertension Mother   . Arthritis Mother        ?RA  . Hypertension Father     Allergies  Allergen Reactions  . Ace Inhibitors Other (See Comments)    Worsening renal insufficiency    Current Outpatient Medications on File Prior to Visit  Medication Sig Dispense Refill  . acetaminophen (TYLENOL) 500 MG tablet Take 500 mg by mouth every 6 (six) hours as needed for mild pain.    [name] albuterol (PROVENTIL) (2.5 MG/3ML) 0.083% nebulizer solution Take 2.5 mg by nebulization every 4 (four) hours as needed for shortness of breath. Reported on 09/25/2015    . B Complex-C-Folic Acid (RENA-VITE RX) 1 MG TABS Take 1 tablet by mouth at bedtime.   6  . furosemide (LASIX) 40 MG tablet TAKE 1 TABLET(40 MG) BY MOUTH TWICE DAILY 180 tablet 1  . hydroxychloroquine (PLAQUENIL) 200 MG tablet TAKE 1 TABLET(200 MG) BY MOUTH DAILY 90 tablet 1  . RENVELA 800 MG tablet Take 3 tablets (2,400 mg total) by mouth 3 (three) times daily with meals. 90 tablet 0   No current facility-administered medications on file prior to visit.     BP 99/64 (BP Location: Right Arm, Cuff Size: Large)   Pulse (!) 55   Temp 98 F (36.7 C) (Oral)   Resp 16   Ht 6\' 2"  (1.88 m)   Wt 276 lb 12.8 oz (125.6 kg)   SpO2 99%   BMI 35.54 kg/m        Objective:   Physical Exam  Constitutional: He is oriented to person, place, and time. He appears well-developed and well-nourished. No distress.  HENT:  Head: Normocephalic and atraumatic.  Cardiovascular: Normal rate and regular rhythm.  No murmur heard. Pulmonary/Chest: Effort normal and breath sounds normal. No respiratory distress. He has no wheezes. He has no rales.  Musculoskeletal: He exhibits no edema.  Neurological: He is alert and oriented to person, place, and time.  Skin: Skin is warm and dry.  Psychiatric: He has a normal mood and affect. His behavior is normal. Thought content normal.          Assessment & Plan:  Diabetes type 2-stable on diabetic diet.  Will check follow-up A1c today.  Atrial fibrillation-his INR was subtherapeutic at his visit in the emergency department.  Repeat INR today is slightly better at 1.8.  He is currently taking Coumadin a full tablet of 7.5 mg daily except for Monday Wednesday Friday when he takes 1/2 tablet by mouth daily.  I have advised him to cut back to half tablet on Mondays and Wednesdays only and full tablet all other days.  He will need a follow-up INR checked in 1 week.  I have sent a staff message to his nephrologist alerting him of this and also asked the patient to double check at his hemodialysis a week from Wednesday to make sure there including an INR.  Hypertension-blood pressure is stable.  Monitor.  End-stage renal disease-continues hemodialysis.  Clinically stable.

## 2018-01-03 DIAGNOSIS — I4891 Unspecified atrial fibrillation: Secondary | ICD-10-CM | POA: Diagnosis not present

## 2018-01-03 DIAGNOSIS — E8770 Fluid overload, unspecified: Secondary | ICD-10-CM | POA: Diagnosis not present

## 2018-01-03 DIAGNOSIS — N2581 Secondary hyperparathyroidism of renal origin: Secondary | ICD-10-CM | POA: Diagnosis not present

## 2018-01-03 DIAGNOSIS — D631 Anemia in chronic kidney disease: Secondary | ICD-10-CM | POA: Diagnosis not present

## 2018-01-03 DIAGNOSIS — D509 Iron deficiency anemia, unspecified: Secondary | ICD-10-CM | POA: Diagnosis not present

## 2018-01-03 DIAGNOSIS — N186 End stage renal disease: Secondary | ICD-10-CM | POA: Diagnosis not present

## 2018-01-05 DIAGNOSIS — N2581 Secondary hyperparathyroidism of renal origin: Secondary | ICD-10-CM | POA: Diagnosis not present

## 2018-01-05 DIAGNOSIS — E8770 Fluid overload, unspecified: Secondary | ICD-10-CM | POA: Diagnosis not present

## 2018-01-05 DIAGNOSIS — D631 Anemia in chronic kidney disease: Secondary | ICD-10-CM | POA: Diagnosis not present

## 2018-01-05 DIAGNOSIS — D509 Iron deficiency anemia, unspecified: Secondary | ICD-10-CM | POA: Diagnosis not present

## 2018-01-05 DIAGNOSIS — I4891 Unspecified atrial fibrillation: Secondary | ICD-10-CM | POA: Diagnosis not present

## 2018-01-05 DIAGNOSIS — N186 End stage renal disease: Secondary | ICD-10-CM | POA: Diagnosis not present

## 2018-01-06 DIAGNOSIS — N2581 Secondary hyperparathyroidism of renal origin: Secondary | ICD-10-CM | POA: Diagnosis not present

## 2018-01-06 DIAGNOSIS — N186 End stage renal disease: Secondary | ICD-10-CM | POA: Diagnosis not present

## 2018-01-06 DIAGNOSIS — E8770 Fluid overload, unspecified: Secondary | ICD-10-CM | POA: Diagnosis not present

## 2018-01-06 DIAGNOSIS — D631 Anemia in chronic kidney disease: Secondary | ICD-10-CM | POA: Diagnosis not present

## 2018-01-06 DIAGNOSIS — D509 Iron deficiency anemia, unspecified: Secondary | ICD-10-CM | POA: Diagnosis not present

## 2018-01-06 DIAGNOSIS — I4891 Unspecified atrial fibrillation: Secondary | ICD-10-CM | POA: Diagnosis not present

## 2018-01-08 DIAGNOSIS — D631 Anemia in chronic kidney disease: Secondary | ICD-10-CM | POA: Diagnosis not present

## 2018-01-08 DIAGNOSIS — I4891 Unspecified atrial fibrillation: Secondary | ICD-10-CM | POA: Diagnosis not present

## 2018-01-08 DIAGNOSIS — E8770 Fluid overload, unspecified: Secondary | ICD-10-CM | POA: Diagnosis not present

## 2018-01-08 DIAGNOSIS — D509 Iron deficiency anemia, unspecified: Secondary | ICD-10-CM | POA: Diagnosis not present

## 2018-01-08 DIAGNOSIS — N186 End stage renal disease: Secondary | ICD-10-CM | POA: Diagnosis not present

## 2018-01-08 DIAGNOSIS — N2581 Secondary hyperparathyroidism of renal origin: Secondary | ICD-10-CM | POA: Diagnosis not present

## 2018-01-09 DIAGNOSIS — E8770 Fluid overload, unspecified: Secondary | ICD-10-CM | POA: Diagnosis not present

## 2018-01-09 DIAGNOSIS — N186 End stage renal disease: Secondary | ICD-10-CM | POA: Diagnosis not present

## 2018-01-09 DIAGNOSIS — D631 Anemia in chronic kidney disease: Secondary | ICD-10-CM | POA: Diagnosis not present

## 2018-01-09 DIAGNOSIS — D509 Iron deficiency anemia, unspecified: Secondary | ICD-10-CM | POA: Diagnosis not present

## 2018-01-09 DIAGNOSIS — N2581 Secondary hyperparathyroidism of renal origin: Secondary | ICD-10-CM | POA: Diagnosis not present

## 2018-01-09 DIAGNOSIS — I4891 Unspecified atrial fibrillation: Secondary | ICD-10-CM | POA: Diagnosis not present

## 2018-01-10 DIAGNOSIS — I4891 Unspecified atrial fibrillation: Secondary | ICD-10-CM | POA: Diagnosis not present

## 2018-01-10 DIAGNOSIS — N186 End stage renal disease: Secondary | ICD-10-CM | POA: Diagnosis not present

## 2018-01-10 DIAGNOSIS — E8770 Fluid overload, unspecified: Secondary | ICD-10-CM | POA: Diagnosis not present

## 2018-01-10 DIAGNOSIS — N2581 Secondary hyperparathyroidism of renal origin: Secondary | ICD-10-CM | POA: Diagnosis not present

## 2018-01-10 DIAGNOSIS — D509 Iron deficiency anemia, unspecified: Secondary | ICD-10-CM | POA: Diagnosis not present

## 2018-01-10 DIAGNOSIS — D631 Anemia in chronic kidney disease: Secondary | ICD-10-CM | POA: Diagnosis not present

## 2018-01-10 DIAGNOSIS — D689 Coagulation defect, unspecified: Secondary | ICD-10-CM | POA: Diagnosis not present

## 2018-01-11 LAB — POCT INR: INR: 2.3 — AB (ref 0.9–1.1)

## 2018-01-12 ENCOUNTER — Telehealth: Payer: Self-pay | Admitting: *Deleted

## 2018-01-12 DIAGNOSIS — D631 Anemia in chronic kidney disease: Secondary | ICD-10-CM | POA: Diagnosis not present

## 2018-01-12 DIAGNOSIS — I4891 Unspecified atrial fibrillation: Secondary | ICD-10-CM | POA: Diagnosis not present

## 2018-01-12 DIAGNOSIS — N186 End stage renal disease: Secondary | ICD-10-CM | POA: Diagnosis not present

## 2018-01-12 DIAGNOSIS — E8770 Fluid overload, unspecified: Secondary | ICD-10-CM | POA: Diagnosis not present

## 2018-01-12 DIAGNOSIS — N2581 Secondary hyperparathyroidism of renal origin: Secondary | ICD-10-CM | POA: Diagnosis not present

## 2018-01-12 DIAGNOSIS — D509 Iron deficiency anemia, unspecified: Secondary | ICD-10-CM | POA: Diagnosis not present

## 2018-01-12 NOTE — Telephone Encounter (Signed)
Received PT/INR results from [employer]; forwarded to provider/SLS 06/28

## 2018-01-12 NOTE — Telephone Encounter (Addendum)
INR from 01/11/18 = 2.29. Pt currently takes Coumadin 7.5mg  (1 tablet) every day except Mondays and Wednesdays when he takes 1/2 tablet.  Please advise?

## 2018-01-13 DIAGNOSIS — D509 Iron deficiency anemia, unspecified: Secondary | ICD-10-CM | POA: Diagnosis not present

## 2018-01-13 DIAGNOSIS — I4891 Unspecified atrial fibrillation: Secondary | ICD-10-CM | POA: Diagnosis not present

## 2018-01-13 DIAGNOSIS — N186 End stage renal disease: Secondary | ICD-10-CM | POA: Diagnosis not present

## 2018-01-13 DIAGNOSIS — D631 Anemia in chronic kidney disease: Secondary | ICD-10-CM | POA: Diagnosis not present

## 2018-01-13 DIAGNOSIS — E8770 Fluid overload, unspecified: Secondary | ICD-10-CM | POA: Diagnosis not present

## 2018-01-13 DIAGNOSIS — N2581 Secondary hyperparathyroidism of renal origin: Secondary | ICD-10-CM | POA: Diagnosis not present

## 2018-01-13 NOTE — Telephone Encounter (Signed)
Noted, continue current dose of coumadin. INR should be rechecked in dialysis in 1 month.

## 2018-01-15 DIAGNOSIS — I5042 Chronic combined systolic (congestive) and diastolic (congestive) heart failure: Secondary | ICD-10-CM | POA: Diagnosis not present

## 2018-01-15 DIAGNOSIS — N186 End stage renal disease: Secondary | ICD-10-CM | POA: Diagnosis not present

## 2018-01-15 DIAGNOSIS — N2581 Secondary hyperparathyroidism of renal origin: Secondary | ICD-10-CM | POA: Diagnosis not present

## 2018-01-15 DIAGNOSIS — I4891 Unspecified atrial fibrillation: Secondary | ICD-10-CM | POA: Diagnosis not present

## 2018-01-15 DIAGNOSIS — Z992 Dependence on renal dialysis: Secondary | ICD-10-CM | POA: Diagnosis not present

## 2018-01-15 DIAGNOSIS — D631 Anemia in chronic kidney disease: Secondary | ICD-10-CM | POA: Diagnosis not present

## 2018-01-15 DIAGNOSIS — E8779 Other fluid overload: Secondary | ICD-10-CM | POA: Diagnosis not present

## 2018-01-15 DIAGNOSIS — I132 Hypertensive heart and chronic kidney disease with heart failure and with stage 5 chronic kidney disease, or end stage renal disease: Secondary | ICD-10-CM | POA: Diagnosis not present

## 2018-01-15 DIAGNOSIS — I158 Other secondary hypertension: Secondary | ICD-10-CM | POA: Diagnosis not present

## 2018-01-15 DIAGNOSIS — E8770 Fluid overload, unspecified: Secondary | ICD-10-CM | POA: Diagnosis not present

## 2018-01-15 DIAGNOSIS — D509 Iron deficiency anemia, unspecified: Secondary | ICD-10-CM | POA: Diagnosis not present

## 2018-01-15 NOTE — Telephone Encounter (Signed)
Patient advised to continue same doze.

## 2018-01-17 DIAGNOSIS — E8770 Fluid overload, unspecified: Secondary | ICD-10-CM | POA: Diagnosis not present

## 2018-01-17 DIAGNOSIS — D631 Anemia in chronic kidney disease: Secondary | ICD-10-CM | POA: Diagnosis not present

## 2018-01-17 DIAGNOSIS — D509 Iron deficiency anemia, unspecified: Secondary | ICD-10-CM | POA: Diagnosis not present

## 2018-01-17 DIAGNOSIS — I4891 Unspecified atrial fibrillation: Secondary | ICD-10-CM | POA: Diagnosis not present

## 2018-01-17 DIAGNOSIS — N2581 Secondary hyperparathyroidism of renal origin: Secondary | ICD-10-CM | POA: Diagnosis not present

## 2018-01-17 DIAGNOSIS — N186 End stage renal disease: Secondary | ICD-10-CM | POA: Diagnosis not present

## 2018-01-19 DIAGNOSIS — E8770 Fluid overload, unspecified: Secondary | ICD-10-CM | POA: Diagnosis not present

## 2018-01-19 DIAGNOSIS — D631 Anemia in chronic kidney disease: Secondary | ICD-10-CM | POA: Diagnosis not present

## 2018-01-19 DIAGNOSIS — D509 Iron deficiency anemia, unspecified: Secondary | ICD-10-CM | POA: Diagnosis not present

## 2018-01-19 DIAGNOSIS — N2581 Secondary hyperparathyroidism of renal origin: Secondary | ICD-10-CM | POA: Diagnosis not present

## 2018-01-19 DIAGNOSIS — I4891 Unspecified atrial fibrillation: Secondary | ICD-10-CM | POA: Diagnosis not present

## 2018-01-19 DIAGNOSIS — N186 End stage renal disease: Secondary | ICD-10-CM | POA: Diagnosis not present

## 2018-01-20 DIAGNOSIS — E8770 Fluid overload, unspecified: Secondary | ICD-10-CM | POA: Diagnosis not present

## 2018-01-20 DIAGNOSIS — I4891 Unspecified atrial fibrillation: Secondary | ICD-10-CM | POA: Diagnosis not present

## 2018-01-20 DIAGNOSIS — N2581 Secondary hyperparathyroidism of renal origin: Secondary | ICD-10-CM | POA: Diagnosis not present

## 2018-01-20 DIAGNOSIS — N186 End stage renal disease: Secondary | ICD-10-CM | POA: Diagnosis not present

## 2018-01-20 DIAGNOSIS — D509 Iron deficiency anemia, unspecified: Secondary | ICD-10-CM | POA: Diagnosis not present

## 2018-01-20 DIAGNOSIS — D631 Anemia in chronic kidney disease: Secondary | ICD-10-CM | POA: Diagnosis not present

## 2018-01-22 DIAGNOSIS — N186 End stage renal disease: Secondary | ICD-10-CM | POA: Diagnosis not present

## 2018-01-22 DIAGNOSIS — D509 Iron deficiency anemia, unspecified: Secondary | ICD-10-CM | POA: Diagnosis not present

## 2018-01-22 DIAGNOSIS — N2581 Secondary hyperparathyroidism of renal origin: Secondary | ICD-10-CM | POA: Diagnosis not present

## 2018-01-22 DIAGNOSIS — E8770 Fluid overload, unspecified: Secondary | ICD-10-CM | POA: Diagnosis not present

## 2018-01-22 DIAGNOSIS — I4891 Unspecified atrial fibrillation: Secondary | ICD-10-CM | POA: Diagnosis not present

## 2018-01-22 DIAGNOSIS — D631 Anemia in chronic kidney disease: Secondary | ICD-10-CM | POA: Diagnosis not present

## 2018-01-23 ENCOUNTER — Ambulatory Visit (INDEPENDENT_AMBULATORY_CARE_PROVIDER_SITE_OTHER): Payer: Medicare Other | Admitting: Physical Therapy

## 2018-01-23 ENCOUNTER — Encounter: Payer: Self-pay | Admitting: Physical Therapy

## 2018-01-23 DIAGNOSIS — R2689 Other abnormalities of gait and mobility: Secondary | ICD-10-CM

## 2018-01-23 DIAGNOSIS — M6281 Muscle weakness (generalized): Secondary | ICD-10-CM

## 2018-01-23 NOTE — Addendum Note (Signed)
Addended by: Lyndee Hensen on: 01/23/2018 03:09 PM   Modules accepted: Orders

## 2018-01-23 NOTE — Therapy (Signed)
[facility] [address] [place], [place], [address] Phone: [phone]   Fax:  [phone]  Physical Therapy Evaluation  Patient Details  Name: Bruce Cole MRN: [id number] Date of Birth: [birth year] Referring Provider: Debbrah Alar   Encounter Date: 01/23/2018  PT End of Session - 01/23/18 1434    Visit Number  1    Number of Visits  8    Date for PT Re-Evaluation  03/20/18    Authorization Type  Medicare    PT Start Time  1100    PT Stop Time  1142    PT Time Calculation (min)  42 min    Equipment Utilized During Treatment  Gait belt    Activity Tolerance  Patient tolerated treatment well;Patient limited by fatigue    Behavior During Therapy  [facility] for tasks assessed/performed       Past Medical History:  Diagnosis Date  . Acute blood loss anemia   . Atrial fibrillation (HCC)    Chronic  . Chronic combined systolic and diastolic heart failure ([place])   . Colon polyp 2000  . Dysrhythmia    hx  . ESRD on hemodialysis ([place])    [place]  . Essential hypertension   . Gastritis and gastroduodenitis   . GI bleed   . Gout   . Heart murmur   . History of blood transfusion   . History of kidney stones   . Nephrolithiasis   . OSA (obstructive sleep apnea) 09/02/2013    IMPRESSION :  1. Mild obstructive sleep apnea with hypopneas causing sleep fragmentation and moderate oxygen desaturation.  2. Short runs of nonsustained VT were noted. His beta blocker may need to be titrated 3. Significant PLM's were noted, the PLM arousal index was low. Please correlate with clinical history of restless leg syndrome.  4. Sleep efficiency was poor.  RECOMMENDATION:  1. Treatment options for this degree of sleep disordered breathing include weight loss and positional therapy to avoid supine sleep 2. Consider titrating beta blocker further, defer to cardiologist 3. Patient should be cautioned against driving when sleepy.They  should be asked to avoid medications with sedative side effects     . Osteoarthritis of right hip   . Pneumonia    hx 30 yrs ago  . Primary osteoarthritis of right hip   . Rheumatoid arthritis ([place])   . Thrombocytopenia ([place])     Past Surgical History:  Procedure Laterality Date  . AV FISTULA PLACEMENT Left 08/26/2015   Procedure: LEFT RADIOCEPHALIC FISTULA CREATION;  Surgeon: Rosetta Posner, MD;  Location: [place];  Service: Vascular;  Laterality: Left;  . AV FISTULA PLACEMENT Left 11/23/2015   Procedure: ARTERIOVENOUS (AV) FISTULA CREATION;  Surgeon: Rosetta Posner, MD;  Location: [place];  Service: Vascular;  Laterality: Left;  . BACK SURGERY     x2- discectomy  . CHOLECYSTECTOMY  1994  . CYSTOSCOPY/RETROGRADE/URETEROSCOPY/STONE EXTRACTION WITH BASKET    . ESOPHAGOGASTRODUODENOSCOPY N/A 11/13/2015   Procedure: ESOPHAGOGASTRODUODENOSCOPY (EGD);  Surgeon: Irene Shipper, MD;  Location: [facility] ENDOSCOPY;  Service: Endoscopy;  Laterality: N/A;  . INSERTION OF DIALYSIS CATHETER Left 11/23/2015   Procedure: INSERTION OF DIALYSIS CATHETER;  Surgeon: Rosetta Posner, MD;  Location: [facility];  Service: Vascular;  Laterality: Left;  . IR GENERIC HISTORICAL Left 08/04/2016   IR DIALY SHUNT INTRO NEEDLE/INTRACATH INITIAL W/IMG LEFT 08/04/2016 Markus Daft, MD [facility]INTER[facility]  . IR GENERIC HISTORICAL  08/04/2016   IR US GUIDE VASC  ACCESS LEFT 08/04/2016 Markus Daft, MD [facility]  . JOINT REPLACEMENT     Total L-Hip replacement, Right Knee 10/20/09  . LEFT AND RIGHT HEART CATHETERIZATION WITH CORONARY ANGIOGRAM N/A 02/22/2013   Procedure: LEFT AND RIGHT HEART CATHETERIZATION WITH CORONARY ANGIOGRAM;  Surgeon: Jolaine Artist, MD;  Location: [facility] CATH LAB;  Service: Cardiovascular;  Laterality: N/A;  . LITHOTRIPSY  90's  . PERIPHERAL VASCULAR CATHETERIZATION Left 11/19/2015   Procedure: A/V/Fistulagram;  Surgeon: Conrad Edgewater, MD;  Location: [place] CV LAB;  Service: Cardiovascular;  Laterality: Left;  . PERIPHERAL VASCULAR  CATHETERIZATION Left 07/21/2016   Procedure: A/V Fistulagram;  Surgeon: Conrad Dundee, MD;  Location: [place] CV LAB;  Service: Cardiovascular;  Laterality: Left;  arm  . REVISON OF ARTERIOVENOUS FISTULA Left 05/30/2016   Procedure: REVISION LEFT UPPER ARM FISTULA;  Surgeon: Conrad , MD;  Location: [place];  Service: Vascular;  Laterality: Left;  . REVISON OF ARTERIOVENOUS FISTULA Left 07/22/2016   Procedure: REVISON OF BASILIC VEIN TRANSPOSITION ANASTOMOSIS;  Surgeon: Rosetta Posner, MD;  Location: [place];  Service: Vascular;  Laterality: Left;  . SPINE SURGERY     x 2  . TOTAL HIP ARTHROPLASTY Right 03/22/2016   Procedure: RIGHT TOTAL HIP ARTHROPLASTY ANTERIOR APPROACH;  Surgeon: Mcarthur Rossetti, MD;  Location: [place];  Service: Orthopedics;  Laterality: Right;    There were no vitals filed for this visit.   Subjective Assessment - 01/23/18 1103    Subjective  Pt lives alone, townhouse. Dialysis 4 x/wk M, W, F, Sat.  He has had 2 THA, 2 TKA, and reports that he needs 2 RTC.  Falls: 4 weeks ago, R toenail needed to removed after. Not diabetic. Pt referred for gait and strength. He feels that he has decreased endurance and decreasing mobility. He uses SPC, has walker at home, but does not use often.      Pertinent History  Pt states minimal pain in knees, hips, and back, despite multiple surgeries. He states minor pain in Bil shoulders, but mobility deficits bother him more than pain.     Limitations  Lifting;Standing;Walking;House hold activities    Currently in Pain?  No/denies         [facility] PT Assessment - 01/23/18 0001      Assessment   Medical Diagnosis  Gait/Balance    Referring Provider  Debbrah Alar    Prior Therapy  not for balance      Precautions   Precautions  Fall      Balance Screen   Has the patient fallen in the past 6 months  Yes    How many times?  1    Has the patient had a decrease in activity level because of a fear of falling?   No    Is the  patient reluctant to leave their home because of a fear of falling?   No      Prior Function   Level of Independence  Independent with household mobility with device      Cognition   Overall Cognitive Status  Within Functional Limits for tasks assessed      Observation/Other Assessments   Observations  Pt with recent R toenail removal, appears to be healing well, recommended follow up wtih PCP for check.       Posture/Postural Control   Posture Comments  Standing, forward flexed posture      ROM / Strength   AROM / PROM / Strength  AROM;Strength      AROM   Overall AROM Comments  LE ROM: WFL      Strength   Overall Strength Comments  LE strength: hips: 3+/5;  Knees: 4/5       Special Tests   Other special tests  Balance: standing static : good;  dynamic: Fair minus      Ambulation/Gait   Assistive device  Straight cane    Gait Comments  up to 100 ft , pt requires seated rest break due to LE fatigue.       Balance   Balance Assessed  Yes      Standardized Balance Assessment   Standardized Balance Assessment  Timed Up and Go Test;Berg Balance Test      Berg Balance Test   Sit to Stand  Able to stand  independently using hands    Standing Unsupported  Able to stand safely 2 minutes    Sitting with Back Unsupported but Feet Supported on Floor or Stool  Able to sit safely and securely 2 minutes    Stand to Sit  Controls descent by using hands    Transfers  Able to transfer safely, definite need of hands    Standing Unsupported with Eyes Closed  Able to stand 10 seconds safely    Standing Ubsupported with Feet Together  Able to place feet together independently and stand for 1 minute with supervision    From Standing, Reach Forward with Outstretched Arm  Can reach forward >5 cm safely (2")    From Standing Position, Pick up Object from Floor  Unable to pick up shoe, but reaches 2-5 cm (1-2") from shoe and balances independently    From Standing Position, Turn to Look Behind  Over each Shoulder  Turn sideways only but maintains balance    Turn 360 Degrees  Able to turn 360 degrees safely but slowly    Standing Unsupported, Alternately Place Feet on Step/Stool  Able to complete >2 steps/needs minimal assist    Standing Unsupported, One Foot in [employer] balance while stepping or standing    Standing on One Leg  Unable to try or needs assist to prevent fall    Total Score  33      Timed Up and Go Test   Normal TUG (seconds)  19.71                Objective measurements completed on examination: See above findings.              PT Education - 01/23/18 1433    Education Details  PT POC,  Will educate on HEP next visit.     Person(s) Educated  Patient    Methods  Explanation    Comprehension  Verbalized understanding       PT Short Term Goals - 01/23/18 1439      PT SHORT TERM GOAL #1   Title  Pt to be independent with initial HEP for LE strength.     Time  3    Period  Weeks    Status  New    Target Date  02/13/18        PT Long Term Goals - 01/23/18 1441      PT LONG TERM GOAL #1   Title  Pt to improve score of TUG by at least 3 sec.    Time  8    Period  Weeks    Status  New    Target  Date  03/20/18      PT LONG TERM GOAL #2   Title  Pt to improve score of BERG by at least 4 points.     Time  8    Period  Weeks    Status  New    Target Date  03/20/18      PT LONG TERM GOAL #3   Title  Pt to demo increased strength of Bil LEs to at least 4+/5 to improve endurance with gait.     Time  8    Period  Weeks    Status  New    Target Date  03/20/18      PT LONG TERM GOAL #4   Title  Pt to demo ability for ambulation up to at least 514ft, with LRAD,  to improve ability for home and community navigation     Time  8    Period  Weeks    Status  New    Target Date  03/20/18             Plan - 01/23/18 1453    Clinical Impression Statement  Pt presents wtih primary complaint of decreased mobility and  endurance. He has decreased strength of Bil LEs, and decreased ability for sustaining standing, walking, and functional activity. Pt with poor standing posture, with knee flexion, and trunk flexion, and uses SPC for support. He demonstrates slow gait speed, with decreased step height and stance width. He will benefit form continued use of this at this time, for safety and fall risk.  He has poor static and dynamic balance per testing today, and is a fall risk. Pt to benefit from skilled PT to improve LE strength , endurance, balance, and gait. Pt with multiple medical issues, and is doing dialysis 4 days/wk. Expect slower progress due to fatigue and only being able to attend 1x/wk.     History and Personal Factors relevant to plan of care:  Pt able to attend PT 1x/wk due to multiple medical issues, and having dialysis 4 x/wk.     Clinical Presentation  Evolving    Clinical Decision Making  Moderate    Rehab Potential  Fair    PT Frequency  1x / week    PT Duration  8 weeks    PT Treatment/Interventions  ADLs/Self Care Home Management;Cryotherapy;Electrical Stimulation;Moist Heat;Therapeutic activities;Functional mobility training;Stair training;Gait training;DME Instruction;Ultrasound;Therapeutic exercise;Balance training;Neuromuscular re-education;Patient/family education;Passive range of motion;Manual techniques;Taping    Consulted and Agree with Plan of Care  Patient       Patient will benefit from skilled therapeutic intervention in order to improve the following deficits and impairments:  Abnormal gait, Decreased endurance, Hypomobility, Decreased activity tolerance, Decreased strength, Pain, Difficulty walking, Decreased mobility, Decreased balance, Decreased range of motion, Improper body mechanics, Postural dysfunction, Impaired flexibility, Decreased safety awareness  Visit Diagnosis: Other abnormalities of gait and mobility  Muscle weakness (generalized)     Problem List Patient  Active Problem List   Diagnosis Date Noted  . High risk medication use 03/17/2017  . Hyperkalemia 08/01/2016  . Aftercare following surgery of the circulatory system 06/17/2016  . Status post total replacement of right hip 03/22/2016  . ESRD on dialysis ([place]) 11/27/2015  . OSA (obstructive sleep apnea) 09/02/2013  . Chronic combined systolic and diastolic heart failure ([place]) 03/16/2013  . Allergic rhinitis 12/18/2012  . Long term current use of anticoagulant therapy 11/25/2010  . Genital herpes 05/31/2010  . DM (diabetes mellitus), type 2 with renal complications (  [place]) 11/23/2009  . ERECTILE DYSFUNCTION, ORGANIC 09/21/2009  . Osteoarthritis 09/21/2009  . PERSONAL HX COLONIC POLYPS 08/25/2009  . Gout 07/22/2009  . Essential hypertension 07/22/2009  . ATRIAL FIBRILLATION 07/22/2009  . Rheumatoid arthritis ([place]) 07/22/2009  . NEPHROLITHIASIS, HX OF 07/22/2009    Lyndee Hensen, PT, DPT 3:04 PM  01/23/18    [place], [place], [address] Phone: [phone]   Fax:  [phone]  Name: DWAYNE BULKLEY MRN: [id number] Date of Birth: 1941/02/11

## 2018-01-24 DIAGNOSIS — D509 Iron deficiency anemia, unspecified: Secondary | ICD-10-CM | POA: Diagnosis not present

## 2018-01-24 DIAGNOSIS — E8770 Fluid overload, unspecified: Secondary | ICD-10-CM | POA: Diagnosis not present

## 2018-01-24 DIAGNOSIS — N186 End stage renal disease: Secondary | ICD-10-CM | POA: Diagnosis not present

## 2018-01-24 DIAGNOSIS — N2581 Secondary hyperparathyroidism of renal origin: Secondary | ICD-10-CM | POA: Diagnosis not present

## 2018-01-24 DIAGNOSIS — D631 Anemia in chronic kidney disease: Secondary | ICD-10-CM | POA: Diagnosis not present

## 2018-01-24 DIAGNOSIS — I4891 Unspecified atrial fibrillation: Secondary | ICD-10-CM | POA: Diagnosis not present

## 2018-01-26 DIAGNOSIS — I4891 Unspecified atrial fibrillation: Secondary | ICD-10-CM | POA: Diagnosis not present

## 2018-01-26 DIAGNOSIS — E8770 Fluid overload, unspecified: Secondary | ICD-10-CM | POA: Diagnosis not present

## 2018-01-26 DIAGNOSIS — D509 Iron deficiency anemia, unspecified: Secondary | ICD-10-CM | POA: Diagnosis not present

## 2018-01-26 DIAGNOSIS — N186 End stage renal disease: Secondary | ICD-10-CM | POA: Diagnosis not present

## 2018-01-26 DIAGNOSIS — D631 Anemia in chronic kidney disease: Secondary | ICD-10-CM | POA: Diagnosis not present

## 2018-01-26 DIAGNOSIS — N2581 Secondary hyperparathyroidism of renal origin: Secondary | ICD-10-CM | POA: Diagnosis not present

## 2018-01-27 DIAGNOSIS — I4891 Unspecified atrial fibrillation: Secondary | ICD-10-CM | POA: Diagnosis not present

## 2018-01-27 DIAGNOSIS — N2581 Secondary hyperparathyroidism of renal origin: Secondary | ICD-10-CM | POA: Diagnosis not present

## 2018-01-27 DIAGNOSIS — E8770 Fluid overload, unspecified: Secondary | ICD-10-CM | POA: Diagnosis not present

## 2018-01-27 DIAGNOSIS — N186 End stage renal disease: Secondary | ICD-10-CM | POA: Diagnosis not present

## 2018-01-27 DIAGNOSIS — D631 Anemia in chronic kidney disease: Secondary | ICD-10-CM | POA: Diagnosis not present

## 2018-01-27 DIAGNOSIS — D509 Iron deficiency anemia, unspecified: Secondary | ICD-10-CM | POA: Diagnosis not present

## 2018-01-29 DIAGNOSIS — D509 Iron deficiency anemia, unspecified: Secondary | ICD-10-CM | POA: Diagnosis not present

## 2018-01-29 DIAGNOSIS — N186 End stage renal disease: Secondary | ICD-10-CM | POA: Diagnosis not present

## 2018-01-29 DIAGNOSIS — N2581 Secondary hyperparathyroidism of renal origin: Secondary | ICD-10-CM | POA: Diagnosis not present

## 2018-01-29 DIAGNOSIS — I4891 Unspecified atrial fibrillation: Secondary | ICD-10-CM | POA: Diagnosis not present

## 2018-01-29 DIAGNOSIS — E8770 Fluid overload, unspecified: Secondary | ICD-10-CM | POA: Diagnosis not present

## 2018-01-29 DIAGNOSIS — D631 Anemia in chronic kidney disease: Secondary | ICD-10-CM | POA: Diagnosis not present

## 2018-01-30 ENCOUNTER — Encounter: Payer: Medicare Other | Admitting: Physical Therapy

## 2018-01-30 NOTE — Telephone Encounter (Signed)
Pt walked into the office wanting to know when next INR level was due. Advised him per below. Order has been faxed to dialysis center at [phone].

## 2018-01-31 DIAGNOSIS — D631 Anemia in chronic kidney disease: Secondary | ICD-10-CM | POA: Diagnosis not present

## 2018-01-31 DIAGNOSIS — E8770 Fluid overload, unspecified: Secondary | ICD-10-CM | POA: Diagnosis not present

## 2018-01-31 DIAGNOSIS — N2581 Secondary hyperparathyroidism of renal origin: Secondary | ICD-10-CM | POA: Diagnosis not present

## 2018-01-31 DIAGNOSIS — N186 End stage renal disease: Secondary | ICD-10-CM | POA: Diagnosis not present

## 2018-01-31 DIAGNOSIS — I4891 Unspecified atrial fibrillation: Secondary | ICD-10-CM | POA: Diagnosis not present

## 2018-01-31 DIAGNOSIS — D509 Iron deficiency anemia, unspecified: Secondary | ICD-10-CM | POA: Diagnosis not present

## 2018-02-02 DIAGNOSIS — D631 Anemia in chronic kidney disease: Secondary | ICD-10-CM | POA: Diagnosis not present

## 2018-02-02 DIAGNOSIS — N2581 Secondary hyperparathyroidism of renal origin: Secondary | ICD-10-CM | POA: Diagnosis not present

## 2018-02-02 DIAGNOSIS — I4891 Unspecified atrial fibrillation: Secondary | ICD-10-CM | POA: Diagnosis not present

## 2018-02-02 DIAGNOSIS — N186 End stage renal disease: Secondary | ICD-10-CM | POA: Diagnosis not present

## 2018-02-02 DIAGNOSIS — E8770 Fluid overload, unspecified: Secondary | ICD-10-CM | POA: Diagnosis not present

## 2018-02-02 DIAGNOSIS — D509 Iron deficiency anemia, unspecified: Secondary | ICD-10-CM | POA: Diagnosis not present

## 2018-02-03 DIAGNOSIS — D509 Iron deficiency anemia, unspecified: Secondary | ICD-10-CM | POA: Diagnosis not present

## 2018-02-03 DIAGNOSIS — N2581 Secondary hyperparathyroidism of renal origin: Secondary | ICD-10-CM | POA: Diagnosis not present

## 2018-02-03 DIAGNOSIS — E8770 Fluid overload, unspecified: Secondary | ICD-10-CM | POA: Diagnosis not present

## 2018-02-03 DIAGNOSIS — I4891 Unspecified atrial fibrillation: Secondary | ICD-10-CM | POA: Diagnosis not present

## 2018-02-03 DIAGNOSIS — D631 Anemia in chronic kidney disease: Secondary | ICD-10-CM | POA: Diagnosis not present

## 2018-02-03 DIAGNOSIS — N186 End stage renal disease: Secondary | ICD-10-CM | POA: Diagnosis not present

## 2018-02-05 DIAGNOSIS — I4891 Unspecified atrial fibrillation: Secondary | ICD-10-CM | POA: Diagnosis not present

## 2018-02-05 DIAGNOSIS — D509 Iron deficiency anemia, unspecified: Secondary | ICD-10-CM | POA: Diagnosis not present

## 2018-02-05 DIAGNOSIS — D631 Anemia in chronic kidney disease: Secondary | ICD-10-CM | POA: Diagnosis not present

## 2018-02-05 DIAGNOSIS — N186 End stage renal disease: Secondary | ICD-10-CM | POA: Diagnosis not present

## 2018-02-05 DIAGNOSIS — E8770 Fluid overload, unspecified: Secondary | ICD-10-CM | POA: Diagnosis not present

## 2018-02-05 DIAGNOSIS — N2581 Secondary hyperparathyroidism of renal origin: Secondary | ICD-10-CM | POA: Diagnosis not present

## 2018-02-06 ENCOUNTER — Ambulatory Visit (INDEPENDENT_AMBULATORY_CARE_PROVIDER_SITE_OTHER): Payer: Medicare Other | Admitting: Physical Therapy

## 2018-02-06 ENCOUNTER — Encounter: Payer: Self-pay | Admitting: Physical Therapy

## 2018-02-06 DIAGNOSIS — M6281 Muscle weakness (generalized): Secondary | ICD-10-CM | POA: Diagnosis not present

## 2018-02-06 DIAGNOSIS — R2689 Other abnormalities of gait and mobility: Secondary | ICD-10-CM

## 2018-02-06 NOTE — Therapy (Signed)
[facility] [address] [place], [place], [address] Phone: [phone]   Fax:  [phone]  Physical Therapy Treatment  Patient Details  Name: Bruce Cole MRN: [id number] Date of Birth: 1940/11/28 Referring Provider: Debbrah Alar   Encounter Date: 02/06/2018  PT End of Session - 02/06/18 1428    Visit Number  2    Number of Visits  8    Date for PT Re-Evaluation  03/20/18    Authorization Type  Medicare    PT Start Time  1100    PT Stop Time  1148    PT Time Calculation (min)  48 min    Equipment Utilized During Treatment  Gait belt    Activity Tolerance  Patient tolerated treatment well;Patient limited by fatigue    Behavior During Therapy  [facility] for tasks assessed/performed       Past Medical History:  Diagnosis Date  . Acute blood loss anemia   . Atrial fibrillation (HCC)    Chronic  . Chronic combined systolic and diastolic heart failure ([place])   . Colon polyp 2000  . Dysrhythmia    hx  . ESRD on hemodialysis ([place])    [place]  . Essential hypertension   . Gastritis and gastroduodenitis   . GI bleed   . Gout   . Heart murmur   . History of blood transfusion   . History of kidney stones   . Nephrolithiasis   . OSA (obstructive sleep apnea) 09/02/2013    IMPRESSION :  1. Mild obstructive sleep apnea with hypopneas causing sleep fragmentation and moderate oxygen desaturation.  2. Short runs of nonsustained VT were noted. His beta blocker may need to be titrated 3. Significant PLM's were noted, the PLM arousal index was low. Please correlate with clinical history of restless leg syndrome.  4. Sleep efficiency was poor.  RECOMMENDATION:  1. Treatment options for this degree of sleep disordered breathing include weight loss and positional therapy to avoid supine sleep 2. Consider titrating beta blocker further, defer to cardiologist 3. Patient should be cautioned against driving when sleepy.They  should be asked to avoid medications with sedative side effects     . Osteoarthritis of right hip   . Pneumonia    hx 30 yrs ago  . Primary osteoarthritis of right hip   . Rheumatoid arthritis ([place])   . Thrombocytopenia ([place])     Past Surgical History:  Procedure Laterality Date  . AV FISTULA PLACEMENT Left 08/26/2015   Procedure: LEFT RADIOCEPHALIC FISTULA CREATION;  Surgeon: Rosetta Posner, MD;  Location: [place];  Service: Vascular;  Laterality: Left;  . AV FISTULA PLACEMENT Left 11/23/2015   Procedure: ARTERIOVENOUS (AV) FISTULA CREATION;  Surgeon: Rosetta Posner, MD;  Location: [place];  Service: Vascular;  Laterality: Left;  . BACK SURGERY     x2- discectomy  . CHOLECYSTECTOMY  1994  . CYSTOSCOPY/RETROGRADE/URETEROSCOPY/STONE EXTRACTION WITH BASKET    . ESOPHAGOGASTRODUODENOSCOPY N/A 11/13/2015   Procedure: ESOPHAGOGASTRODUODENOSCOPY (EGD);  Surgeon: Irene Shipper, MD;  Location: [facility] ENDOSCOPY;  Service: Endoscopy;  Laterality: N/A;  . INSERTION OF DIALYSIS CATHETER Left 11/23/2015   Procedure: INSERTION OF DIALYSIS CATHETER;  Surgeon: Rosetta Posner, MD;  Location: [place];  Service: Vascular;  Laterality: Left;  . IR GENERIC HISTORICAL Left 08/04/2016   IR DIALY SHUNT INTRO NEEDLE/INTRACATH INITIAL W/IMG LEFT 08/04/2016 Markus Daft, MD [facility]-INTERV RAD  . IR GENERIC HISTORICAL  08/04/2016   IR US GUIDE VASC  ACCESS LEFT 08/04/2016 Markus Daft, MD [facility]-INTERV RAD  . JOINT REPLACEMENT     Total L-Hip replacement, Right Knee 10/20/09  . LEFT AND RIGHT HEART CATHETERIZATION WITH CORONARY ANGIOGRAM N/A 02/22/2013   Procedure: LEFT AND RIGHT HEART CATHETERIZATION WITH CORONARY ANGIOGRAM;  Surgeon: Jolaine Artist, MD;  Location: [facility] CATH LAB;  Service: Cardiovascular;  Laterality: N/A;  . LITHOTRIPSY  90's  . PERIPHERAL VASCULAR CATHETERIZATION Left 11/19/2015   Procedure: A/V/Fistulagram;  Surgeon: Conrad [place], MD;  Location: [place] CV LAB;  Service: Cardiovascular;  Laterality: Left;  . PERIPHERAL VASCULAR  CATHETERIZATION Left 07/21/2016   Procedure: A/V Fistulagram;  Surgeon: Conrad [place], MD;  Location: [place] CV LAB;  Service: Cardiovascular;  Laterality: Left;  arm  . REVISON OF ARTERIOVENOUS FISTULA Left 05/30/2016   Procedure: REVISION LEFT UPPER ARM FISTULA;  Surgeon: Conrad Cloverly, MD;  Location: [place];  Service: Vascular;  Laterality: Left;  . REVISON OF ARTERIOVENOUS FISTULA Left 07/22/2016   Procedure: REVISON OF BASILIC VEIN TRANSPOSITION ANASTOMOSIS;  Surgeon: Rosetta Posner, MD;  Location: [place];  Service: Vascular;  Laterality: Left;  . SPINE SURGERY     x 2  . TOTAL HIP ARTHROPLASTY Right 03/22/2016   Procedure: RIGHT TOTAL HIP ARTHROPLASTY ANTERIOR APPROACH;  Surgeon: Mcarthur Rossetti, MD;  Location: [place];  Service: Orthopedics;  Laterality: Right;    There were no vitals filed for this visit.  Subjective Assessment - 02/06/18 1428    Subjective  Pt with no new complaints today.     Currently in Pain?  No/denies                       [facility] Adult PT Treatment/Exercise - 02/06/18 1101      Ambulation/Gait   Assistive device  --    Gait Comments  --      Posture/Postural Control   Posture Comments  --      Exercises   Exercises  Knee/Hip      Knee/Hip Exercises: Aerobic   Stationary Bike  L1 x 2      Knee/Hip Exercises: Standing   Heel Raises  20 reps    Hip Flexion  2 sets;10 reps;Both;Knee bent    Other Standing Knee Exercises  L/R and diagonal weight shifts using SPC x15 each;       Knee/Hip Exercises: Seated   Long Arc Quad  2 sets;10 reps;Both    Heel Slides Limitations  Seated ankle pumps x20;     Marching  2 sets;10 reps    Sit to Sand  2 sets;5 reps               PT Short Term Goals - 01/23/18 1439      PT SHORT TERM GOAL #1   Title  Pt to be independent with initial HEP for LE strength.     Time  3    Period  Weeks    Status  New    Target Date  02/13/18        PT Long Term Goals - 01/23/18 1441      PT LONG  TERM GOAL #1   Title  Pt to improve score of TUG by at least 3 sec.    Time  8    Period  Weeks    Status  New    Target Date  03/20/18      PT LONG TERM GOAL #2   Title  Pt to improve score of BERG by at least 4 points.     Time  8    Period  Weeks    Status  New    Target Date  03/20/18      PT LONG TERM GOAL #3   Title  Pt to demo increased strength of Bil LEs to at least 4+/5 to improve endurance with gait.     Time  8    Period  Weeks    Status  New    Target Date  03/20/18      PT LONG TERM GOAL #4   Title  Pt to demo ability for ambulation up to at least 528ft, with LRAD,  to improve ability for home and community navigation     Time  8    Period  Weeks    Status  New    Target Date  03/20/18            Plan - 02/06/18 1429    Clinical Impression Statement  Pt with low endurance for standing and standing ther ex. He requires seated rest breaks often, he says mostly due to weakness feeling in R hip. Pt only able to perform 2 min on recumbant bike today. Plan to progress as tolerated.    Rehab Potential  Fair    PT Frequency  1x / week    PT Duration  8 weeks    PT Treatment/Interventions  ADLs/Self Care Home Management;Cryotherapy;Electrical Stimulation;Moist Heat;Therapeutic activities;Functional mobility training;Stair training;Gait training;DME Instruction;Ultrasound;Therapeutic exercise;Balance training;Neuromuscular re-education;Patient/family education;Passive range of motion;Manual techniques;Taping    Consulted and Agree with Plan of Care  Patient       Patient will benefit from skilled therapeutic intervention in order to improve the following deficits and impairments:  Abnormal gait, Decreased endurance, Hypomobility, Decreased activity tolerance, Decreased strength, Pain, Difficulty walking, Decreased mobility, Decreased balance, Decreased range of motion, Improper body mechanics, Postural dysfunction, Impaired flexibility, Decreased safety  awareness  Visit Diagnosis: Other abnormalities of gait and mobility  Muscle weakness (generalized)     Problem List Patient Active Problem List   Diagnosis Date Noted  . High risk medication use 03/17/2017  . Hyperkalemia 08/01/2016  . Aftercare following surgery of the circulatory system 06/17/2016  . Status post total replacement of right hip 03/22/2016  . ESRD on dialysis ([place]) 11/27/2015  . OSA (obstructive sleep apnea) 09/02/2013  . Chronic combined systolic and diastolic heart failure ([place]) 03/16/2013  . Allergic rhinitis 12/18/2012  . Long term current use of anticoagulant therapy 11/25/2010  . Genital herpes 05/31/2010  . DM (diabetes mellitus), type 2 with renal complications ([place]) 74/06/8785  . ERECTILE DYSFUNCTION, ORGANIC 09/21/2009  . Osteoarthritis 09/21/2009  . PERSONAL HX COLONIC POLYPS 08/25/2009  . Gout 07/22/2009  . Essential hypertension 07/22/2009  . ATRIAL FIBRILLATION 07/22/2009  . Rheumatoid arthritis ([place]) 07/22/2009  . NEPHROLITHIASIS, HX OF 07/22/2009    Lyndee Hensen, PT, DPT 2:33 PM  02/06/18    [place], [place], [address] Phone: [phone]   Fax:  [phone]  Name: Bruce Cole MRN: [id number] Date of Birth: Dec 05, 1940

## 2018-02-07 DIAGNOSIS — N186 End stage renal disease: Secondary | ICD-10-CM | POA: Diagnosis not present

## 2018-02-07 DIAGNOSIS — D631 Anemia in chronic kidney disease: Secondary | ICD-10-CM | POA: Diagnosis not present

## 2018-02-07 DIAGNOSIS — D509 Iron deficiency anemia, unspecified: Secondary | ICD-10-CM | POA: Diagnosis not present

## 2018-02-07 DIAGNOSIS — N2581 Secondary hyperparathyroidism of renal origin: Secondary | ICD-10-CM | POA: Diagnosis not present

## 2018-02-07 DIAGNOSIS — E8770 Fluid overload, unspecified: Secondary | ICD-10-CM | POA: Diagnosis not present

## 2018-02-07 DIAGNOSIS — I4891 Unspecified atrial fibrillation: Secondary | ICD-10-CM | POA: Diagnosis not present

## 2018-02-09 DIAGNOSIS — I4891 Unspecified atrial fibrillation: Secondary | ICD-10-CM | POA: Diagnosis not present

## 2018-02-09 DIAGNOSIS — D509 Iron deficiency anemia, unspecified: Secondary | ICD-10-CM | POA: Diagnosis not present

## 2018-02-09 DIAGNOSIS — D631 Anemia in chronic kidney disease: Secondary | ICD-10-CM | POA: Diagnosis not present

## 2018-02-09 DIAGNOSIS — N2581 Secondary hyperparathyroidism of renal origin: Secondary | ICD-10-CM | POA: Diagnosis not present

## 2018-02-09 DIAGNOSIS — E8770 Fluid overload, unspecified: Secondary | ICD-10-CM | POA: Diagnosis not present

## 2018-02-09 DIAGNOSIS — N186 End stage renal disease: Secondary | ICD-10-CM | POA: Diagnosis not present

## 2018-02-10 DIAGNOSIS — N2581 Secondary hyperparathyroidism of renal origin: Secondary | ICD-10-CM | POA: Diagnosis not present

## 2018-02-10 DIAGNOSIS — D509 Iron deficiency anemia, unspecified: Secondary | ICD-10-CM | POA: Diagnosis not present

## 2018-02-10 DIAGNOSIS — D631 Anemia in chronic kidney disease: Secondary | ICD-10-CM | POA: Diagnosis not present

## 2018-02-10 DIAGNOSIS — N186 End stage renal disease: Secondary | ICD-10-CM | POA: Diagnosis not present

## 2018-02-10 DIAGNOSIS — I4891 Unspecified atrial fibrillation: Secondary | ICD-10-CM | POA: Diagnosis not present

## 2018-02-10 DIAGNOSIS — E8770 Fluid overload, unspecified: Secondary | ICD-10-CM | POA: Diagnosis not present

## 2018-02-12 DIAGNOSIS — N186 End stage renal disease: Secondary | ICD-10-CM | POA: Diagnosis not present

## 2018-02-12 DIAGNOSIS — D509 Iron deficiency anemia, unspecified: Secondary | ICD-10-CM | POA: Diagnosis not present

## 2018-02-12 DIAGNOSIS — D631 Anemia in chronic kidney disease: Secondary | ICD-10-CM | POA: Diagnosis not present

## 2018-02-12 DIAGNOSIS — I4891 Unspecified atrial fibrillation: Secondary | ICD-10-CM | POA: Diagnosis not present

## 2018-02-12 DIAGNOSIS — N2581 Secondary hyperparathyroidism of renal origin: Secondary | ICD-10-CM | POA: Diagnosis not present

## 2018-02-12 DIAGNOSIS — E8770 Fluid overload, unspecified: Secondary | ICD-10-CM | POA: Diagnosis not present

## 2018-02-13 ENCOUNTER — Encounter: Payer: Medicare Other | Admitting: Physical Therapy

## 2018-02-14 ENCOUNTER — Other Ambulatory Visit: Payer: Self-pay | Admitting: Family

## 2018-02-14 DIAGNOSIS — E8770 Fluid overload, unspecified: Secondary | ICD-10-CM | POA: Diagnosis not present

## 2018-02-14 DIAGNOSIS — N186 End stage renal disease: Secondary | ICD-10-CM | POA: Diagnosis not present

## 2018-02-14 DIAGNOSIS — I4891 Unspecified atrial fibrillation: Secondary | ICD-10-CM | POA: Diagnosis not present

## 2018-02-14 DIAGNOSIS — D509 Iron deficiency anemia, unspecified: Secondary | ICD-10-CM | POA: Diagnosis not present

## 2018-02-14 DIAGNOSIS — D631 Anemia in chronic kidney disease: Secondary | ICD-10-CM | POA: Diagnosis not present

## 2018-02-14 DIAGNOSIS — N2581 Secondary hyperparathyroidism of renal origin: Secondary | ICD-10-CM | POA: Diagnosis not present

## 2018-02-15 DIAGNOSIS — I158 Other secondary hypertension: Secondary | ICD-10-CM | POA: Diagnosis not present

## 2018-02-15 DIAGNOSIS — N186 End stage renal disease: Secondary | ICD-10-CM | POA: Diagnosis not present

## 2018-02-15 DIAGNOSIS — Z992 Dependence on renal dialysis: Secondary | ICD-10-CM | POA: Diagnosis not present

## 2018-02-16 DIAGNOSIS — D509 Iron deficiency anemia, unspecified: Secondary | ICD-10-CM | POA: Diagnosis not present

## 2018-02-16 DIAGNOSIS — Z992 Dependence on renal dialysis: Secondary | ICD-10-CM | POA: Diagnosis not present

## 2018-02-16 DIAGNOSIS — N2581 Secondary hyperparathyroidism of renal origin: Secondary | ICD-10-CM | POA: Diagnosis not present

## 2018-02-16 DIAGNOSIS — I4891 Unspecified atrial fibrillation: Secondary | ICD-10-CM | POA: Diagnosis not present

## 2018-02-16 DIAGNOSIS — D631 Anemia in chronic kidney disease: Secondary | ICD-10-CM | POA: Diagnosis not present

## 2018-02-16 DIAGNOSIS — I132 Hypertensive heart and chronic kidney disease with heart failure and with stage 5 chronic kidney disease, or end stage renal disease: Secondary | ICD-10-CM | POA: Diagnosis not present

## 2018-02-16 DIAGNOSIS — I5042 Chronic combined systolic (congestive) and diastolic (congestive) heart failure: Secondary | ICD-10-CM | POA: Diagnosis not present

## 2018-02-16 DIAGNOSIS — E8779 Other fluid overload: Secondary | ICD-10-CM | POA: Diagnosis not present

## 2018-02-16 DIAGNOSIS — N186 End stage renal disease: Secondary | ICD-10-CM | POA: Diagnosis not present

## 2018-02-17 DIAGNOSIS — I4891 Unspecified atrial fibrillation: Secondary | ICD-10-CM | POA: Diagnosis not present

## 2018-02-17 DIAGNOSIS — Z992 Dependence on renal dialysis: Secondary | ICD-10-CM | POA: Diagnosis not present

## 2018-02-17 DIAGNOSIS — D509 Iron deficiency anemia, unspecified: Secondary | ICD-10-CM | POA: Diagnosis not present

## 2018-02-17 DIAGNOSIS — N186 End stage renal disease: Secondary | ICD-10-CM | POA: Diagnosis not present

## 2018-02-17 DIAGNOSIS — N2581 Secondary hyperparathyroidism of renal origin: Secondary | ICD-10-CM | POA: Diagnosis not present

## 2018-02-17 DIAGNOSIS — D631 Anemia in chronic kidney disease: Secondary | ICD-10-CM | POA: Diagnosis not present

## 2018-02-19 DIAGNOSIS — N186 End stage renal disease: Secondary | ICD-10-CM | POA: Diagnosis not present

## 2018-02-19 DIAGNOSIS — D631 Anemia in chronic kidney disease: Secondary | ICD-10-CM | POA: Diagnosis not present

## 2018-02-19 DIAGNOSIS — N2581 Secondary hyperparathyroidism of renal origin: Secondary | ICD-10-CM | POA: Diagnosis not present

## 2018-02-19 DIAGNOSIS — I4891 Unspecified atrial fibrillation: Secondary | ICD-10-CM | POA: Diagnosis not present

## 2018-02-19 DIAGNOSIS — D509 Iron deficiency anemia, unspecified: Secondary | ICD-10-CM | POA: Diagnosis not present

## 2018-02-19 DIAGNOSIS — Z992 Dependence on renal dialysis: Secondary | ICD-10-CM | POA: Diagnosis not present

## 2018-02-20 ENCOUNTER — Encounter: Payer: Self-pay | Admitting: Physical Therapy

## 2018-02-20 ENCOUNTER — Telehealth: Payer: Self-pay | Admitting: Family

## 2018-02-20 ENCOUNTER — Ambulatory Visit (INDEPENDENT_AMBULATORY_CARE_PROVIDER_SITE_OTHER): Payer: Medicare Other | Admitting: Physical Therapy

## 2018-02-20 VITALS — BP 116/64 | HR 51

## 2018-02-20 DIAGNOSIS — R2681 Unsteadiness on feet: Secondary | ICD-10-CM

## 2018-02-20 DIAGNOSIS — M6281 Muscle weakness (generalized): Secondary | ICD-10-CM | POA: Diagnosis not present

## 2018-02-20 DIAGNOSIS — R2689 Other abnormalities of gait and mobility: Secondary | ICD-10-CM

## 2018-02-20 NOTE — Telephone Encounter (Signed)
I received a note from his physical therapist that it has been difficult for him to get there on his nondialysis days because he is been so tired.  Would he prefer that we send a therapist to his home instead?

## 2018-02-20 NOTE — Therapy (Addendum)
[facility] [address] [place], [place], [address] Phone: [phone]   Fax:  [phone]  Physical Therapy Treatment  Patient Details  Name: Bruce Cole MRN: [id number] Date of Birth: 01-Feb-1941 Referring Provider: Debbrah Alar   Encounter Date: 02/20/2018  PT End of Session - 02/20/18 1138    Visit Number  3    Number of Visits  8    Date for PT Re-Evaluation  03/20/18    Authorization Type  Medicare    PT Start Time  1055    PT Stop Time  1125    PT Time Calculation (min)  30 min    Equipment Utilized During Treatment  Gait belt    Activity Tolerance  Patient tolerated treatment well;Patient limited by fatigue    Behavior During Therapy  [facility] for tasks assessed/performed       Past Medical History:  Diagnosis Date  . Acute blood loss anemia   . Atrial fibrillation (HCC)    Chronic  . Chronic combined systolic and diastolic heart failure ([place])   . Colon polyp 2000  . Dysrhythmia    hx  . ESRD on hemodialysis ([place])    [place]  . Essential hypertension   . Gastritis and gastroduodenitis   . GI bleed   . Gout   . Heart murmur   . History of blood transfusion   . History of kidney stones   . Nephrolithiasis   . OSA (obstructive sleep apnea) 09/02/2013    IMPRESSION :  1. Mild obstructive sleep apnea with hypopneas causing sleep fragmentation and moderate oxygen desaturation.  2. Short runs of nonsustained VT were noted. His beta blocker may need to be titrated 3. Significant PLM's were noted, the PLM arousal index was low. Please correlate with clinical history of restless leg syndrome.  4. Sleep efficiency was poor.  RECOMMENDATION:  1. Treatment options for this degree of sleep disordered breathing include weight loss and positional therapy to avoid supine sleep 2. Consider titrating beta blocker further, defer to cardiologist 3. Patient should be cautioned against driving when sleepy.They  should be asked to avoid medications with sedative side effects     . Osteoarthritis of right hip   . Pneumonia    hx 30 yrs ago  . Primary osteoarthritis of right hip   . Rheumatoid arthritis ([place])   . Thrombocytopenia ([place])     Past Surgical History:  Procedure Laterality Date  . AV FISTULA PLACEMENT Left 08/26/2015   Procedure: LEFT RADIOCEPHALIC FISTULA CREATION;  Surgeon: Rosetta Posner, MD;  Location: [place];  Service: Vascular;  Laterality: Left;  . AV FISTULA PLACEMENT Left 11/23/2015   Procedure: ARTERIOVENOUS (AV) FISTULA CREATION;  Surgeon: Rosetta Posner, MD;  Location: [place];  Service: Vascular;  Laterality: Left;  . BACK SURGERY     x2- discectomy  . CHOLECYSTECTOMY  1994  . CYSTOSCOPY/RETROGRADE/URETEROSCOPY/STONE EXTRACTION WITH BASKET    . ESOPHAGOGASTRODUODENOSCOPY N/A 11/13/2015   Procedure: ESOPHAGOGASTRODUODENOSCOPY (EGD);  Surgeon: Irene Shipper, MD;  Location: [facility] ENDOSCOPY;  Service: Endoscopy;  Laterality: N/A;  . INSERTION OF DIALYSIS CATHETER Left 11/23/2015   Procedure: INSERTION OF DIALYSIS CATHETER;  Surgeon: Rosetta Posner, MD;  Location: [facility];  Service: Vascular;  Laterality: Left;  . IR GENERIC HISTORICAL Left 08/04/2016   IR DIALY SHUNT INTRO NEEDLE/INTRACATH INITIAL W/IMG LEFT 08/04/2016 Markus Daft, MD [facility]-INTERV RAD  . IR GENERIC HISTORICAL  08/04/2016   IR US GUIDE VASC  ACCESS LEFT 08/04/2016 Markus Daft, MD [facility]INTER[facility] RAD  . JOINT REPLACEMENT     Total L-Hip replacement, Right Knee 10/20/09  . LEFT AND RIGHT HEART CATHETERIZATION WITH CORONARY ANGIOGRAM N/A 02/22/2013   Procedure: LEFT AND RIGHT HEART CATHETERIZATION WITH CORONARY ANGIOGRAM;  Surgeon: Jolaine Artist, MD;  Location: [facility] CATH LAB;  Service: Cardiovascular;  Laterality: N/A;  . LITHOTRIPSY  90's  . PERIPHERAL VASCULAR CATHETERIZATION Left 11/19/2015   Procedure: A/V/Fistulagram;  Surgeon: Conrad [place], MD;  Location: [place] CV LAB;  Service: Cardiovascular;  Laterality: Left;  . PERIPHERAL VASCULAR  CATHETERIZATION Left 07/21/2016   Procedure: A/V Fistulagram;  Surgeon: Conrad [place], MD;  Location: [place] CV LAB;  Service: Cardiovascular;  Laterality: Left;  arm  . REVISON OF ARTERIOVENOUS FISTULA Left 05/30/2016   Procedure: REVISION LEFT UPPER ARM FISTULA;  Surgeon: Conrad [place], MD;  Location: [place];  Service: Vascular;  Laterality: Left;  . REVISON OF ARTERIOVENOUS FISTULA Left 07/22/2016   Procedure: REVISON OF BASILIC VEIN TRANSPOSITION ANASTOMOSIS;  Surgeon: Rosetta Posner, MD;  Location: [place];  Service: Vascular;  Laterality: Left;  . SPINE SURGERY     x 2  . TOTAL HIP ARTHROPLASTY Right 03/22/2016   Procedure: RIGHT TOTAL HIP ARTHROPLASTY ANTERIOR APPROACH;  Surgeon: Mcarthur Rossetti, MD;  Location: [place];  Service: Orthopedics;  Laterality: Right;    Vitals:   02/20/18 1137  BP: 116/64  Pulse: (!) 51    Subjective Assessment - 02/20/18 1138    Subjective  Pt states he missed last week, due to being too fatigued. He states is is also very tired today, after dialysis yesterday.     Currently in Pain?  No/denies                       [facility] Adult PT Treatment/Exercise - 02/20/18 1055      Exercises   Exercises  Knee/Hip      Knee/Hip Exercises: Aerobic   Stationary Bike  --      Knee/Hip Exercises: Standing   Heel Raises  10 reps    Hip Flexion  --    Other Standing Knee Exercises  --      Knee/Hip Exercises: Seated   Long Arc Quad  10 reps;Both;3 sets    Heel Slides Limitations  Seated ankle pumps x20;     Marching  --    Hamstring Curl  20 reps    Hamstring Limitations  RTB    Sit to Sand  5 reps;2 sets               PT Short Term Goals - 01/23/18 1439      PT SHORT TERM GOAL #1   Title  Pt to be independent with initial HEP for LE strength.     Time  3    Period  Weeks    Status  New    Target Date  02/13/18        PT Long Term Goals - 01/23/18 1441      PT LONG TERM GOAL #1   Title  Pt to improve score of TUG by  at least 3 sec.    Time  8    Period  Weeks    Status  New    Target Date  03/20/18      PT LONG TERM GOAL #2   Title  Pt to improve score of BERG by at least 4  points.     Time  8    Period  Weeks    Status  New    Target Date  03/20/18      PT LONG TERM GOAL #3   Title  Pt to demo increased strength of Bil LEs to at least 4+/5 to improve endurance with gait.     Time  8    Period  Weeks    Status  New    Target Date  03/20/18      PT LONG TERM GOAL #4   Title  Pt to demo ability for ambulation up to at least 552f, with LRAD,  to improve ability for home and community navigation     Time  8    Period  Weeks    Status  New    Target Date  03/20/18            Plan - 02/20/18 1139    Clinical Impression Statement  Pt very fatigued today with standing activity. He states that his "eyes are fuzzy" after doing 10 standing heel raises. He does know his limitations of needing to rest. Pts symptoms subsided, he tries to explain symptoms, as when he over exerts himself, his vision changes, and light changes. Pt educated on needing to let body rest if this is occuring from activity. Session ceased today, BP taken (normal), and pt assisted to waiting room, to sit and rest before exiting. Pt with no sympotms upon leaving clinic. Pt states understanding of needing to rest if symptoms arise. Will discuss possibility of home care PT for this pt, due to decreased energy level to attend outpt PT.     Rehab Potential  Fair    PT Frequency  1x / week    PT Duration  8 weeks    PT Treatment/Interventions  ADLs/Self Care Home Management;Cryotherapy;Electrical Stimulation;Moist Heat;Therapeutic activities;Functional mobility training;Stair training;Gait training;DME Instruction;Ultrasound;Therapeutic exercise;Balance training;Neuromuscular re-education;Patient/family education;Passive range of motion;Manual techniques;Taping    Consulted and Agree with Plan of Care  Patient       Patient will  benefit from skilled therapeutic intervention in order to improve the following deficits and impairments:  Abnormal gait, Decreased endurance, Hypomobility, Decreased activity tolerance, Decreased strength, Pain, Difficulty walking, Decreased mobility, Decreased balance, Decreased range of motion, Improper body mechanics, Postural dysfunction, Impaired flexibility, Decreased safety awareness  Visit Diagnosis: Other abnormalities of gait and mobility  Muscle weakness (generalized)     Problem List Patient Active Problem List   Diagnosis Date Noted  . High risk medication use 03/17/2017  . Hyperkalemia 08/01/2016  . Aftercare following surgery of the circulatory system 06/17/2016  . Status post total replacement of right hip 03/22/2016  . ESRD on dialysis ([place] 11/27/2015  . OSA (obstructive sleep apnea) 09/02/2013  . Chronic combined systolic and diastolic heart failure ([place] 03/16/2013  . Allergic rhinitis 12/18/2012  . Long term current use of anticoagulant therapy 11/25/2010  . Genital herpes 05/31/2010  . DM (diabetes mellitus), type 2 with renal complications ([place] 074/06/8785 . ERECTILE DYSFUNCTION, ORGANIC 09/21/2009  . Osteoarthritis 09/21/2009  . PERSONAL HX COLONIC POLYPS 08/25/2009  . Gout 07/22/2009  . Essential hypertension 07/22/2009  . ATRIAL FIBRILLATION 07/22/2009  . Rheumatoid arthritis ([place] 07/22/2009  . NEPHROLITHIASIS, HX OF 07/22/2009   LLyndee Hensen PT, DPT 11:43 AM  02/20/18    [place] [address]Phone: [phone]  Fax:  [phone] Name: HHAYWOOD Cole  MRN: [id number] Date of Birth: [birth year]   PHYSICAL THERAPY DISCHARGE SUMMARY  Visits from Start of Care:3  Recommended home health to pt's provider. Last seen at 8/6 visit. Pt did not return following this.   Plan: Patient agrees to discharge.  Patient goals were not met. Patient is being discharged due  to a change in medical status.  ?????

## 2018-02-20 NOTE — Telephone Encounter (Signed)
Left message for pt to return my call. Ok for PEC / Triage to discuss with pt. 

## 2018-02-21 DIAGNOSIS — Z992 Dependence on renal dialysis: Secondary | ICD-10-CM | POA: Diagnosis not present

## 2018-02-21 DIAGNOSIS — D631 Anemia in chronic kidney disease: Secondary | ICD-10-CM | POA: Diagnosis not present

## 2018-02-21 DIAGNOSIS — N2581 Secondary hyperparathyroidism of renal origin: Secondary | ICD-10-CM | POA: Diagnosis not present

## 2018-02-21 DIAGNOSIS — I4891 Unspecified atrial fibrillation: Secondary | ICD-10-CM | POA: Diagnosis not present

## 2018-02-21 DIAGNOSIS — D509 Iron deficiency anemia, unspecified: Secondary | ICD-10-CM | POA: Diagnosis not present

## 2018-02-21 DIAGNOSIS — N186 End stage renal disease: Secondary | ICD-10-CM | POA: Diagnosis not present

## 2018-02-23 DIAGNOSIS — D631 Anemia in chronic kidney disease: Secondary | ICD-10-CM | POA: Diagnosis not present

## 2018-02-23 DIAGNOSIS — I4891 Unspecified atrial fibrillation: Secondary | ICD-10-CM | POA: Diagnosis not present

## 2018-02-23 DIAGNOSIS — D509 Iron deficiency anemia, unspecified: Secondary | ICD-10-CM | POA: Diagnosis not present

## 2018-02-23 DIAGNOSIS — N186 End stage renal disease: Secondary | ICD-10-CM | POA: Diagnosis not present

## 2018-02-23 DIAGNOSIS — Z992 Dependence on renal dialysis: Secondary | ICD-10-CM | POA: Diagnosis not present

## 2018-02-23 DIAGNOSIS — N2581 Secondary hyperparathyroidism of renal origin: Secondary | ICD-10-CM | POA: Diagnosis not present

## 2018-02-24 DIAGNOSIS — Z992 Dependence on renal dialysis: Secondary | ICD-10-CM | POA: Diagnosis not present

## 2018-02-24 DIAGNOSIS — I4891 Unspecified atrial fibrillation: Secondary | ICD-10-CM | POA: Diagnosis not present

## 2018-02-24 DIAGNOSIS — N186 End stage renal disease: Secondary | ICD-10-CM | POA: Diagnosis not present

## 2018-02-24 DIAGNOSIS — N2581 Secondary hyperparathyroidism of renal origin: Secondary | ICD-10-CM | POA: Diagnosis not present

## 2018-02-24 DIAGNOSIS — D631 Anemia in chronic kidney disease: Secondary | ICD-10-CM | POA: Diagnosis not present

## 2018-02-24 DIAGNOSIS — D509 Iron deficiency anemia, unspecified: Secondary | ICD-10-CM | POA: Diagnosis not present

## 2018-02-26 DIAGNOSIS — N186 End stage renal disease: Secondary | ICD-10-CM | POA: Diagnosis not present

## 2018-02-26 DIAGNOSIS — I4891 Unspecified atrial fibrillation: Secondary | ICD-10-CM | POA: Diagnosis not present

## 2018-02-26 DIAGNOSIS — D509 Iron deficiency anemia, unspecified: Secondary | ICD-10-CM | POA: Diagnosis not present

## 2018-02-26 DIAGNOSIS — D631 Anemia in chronic kidney disease: Secondary | ICD-10-CM | POA: Diagnosis not present

## 2018-02-26 DIAGNOSIS — N2581 Secondary hyperparathyroidism of renal origin: Secondary | ICD-10-CM | POA: Diagnosis not present

## 2018-02-26 DIAGNOSIS — Z992 Dependence on renal dialysis: Secondary | ICD-10-CM | POA: Diagnosis not present

## 2018-02-26 IMAGING — DX DG CHEST 1V PORT
1 series · 1 of 1 positions shown · non-contrast
Comparison: 08/19/2015

CLINICAL DATA: Short of breath for 4 day

EXAM:
PORTABLE CHEST 1 VIEW

[chest ap]
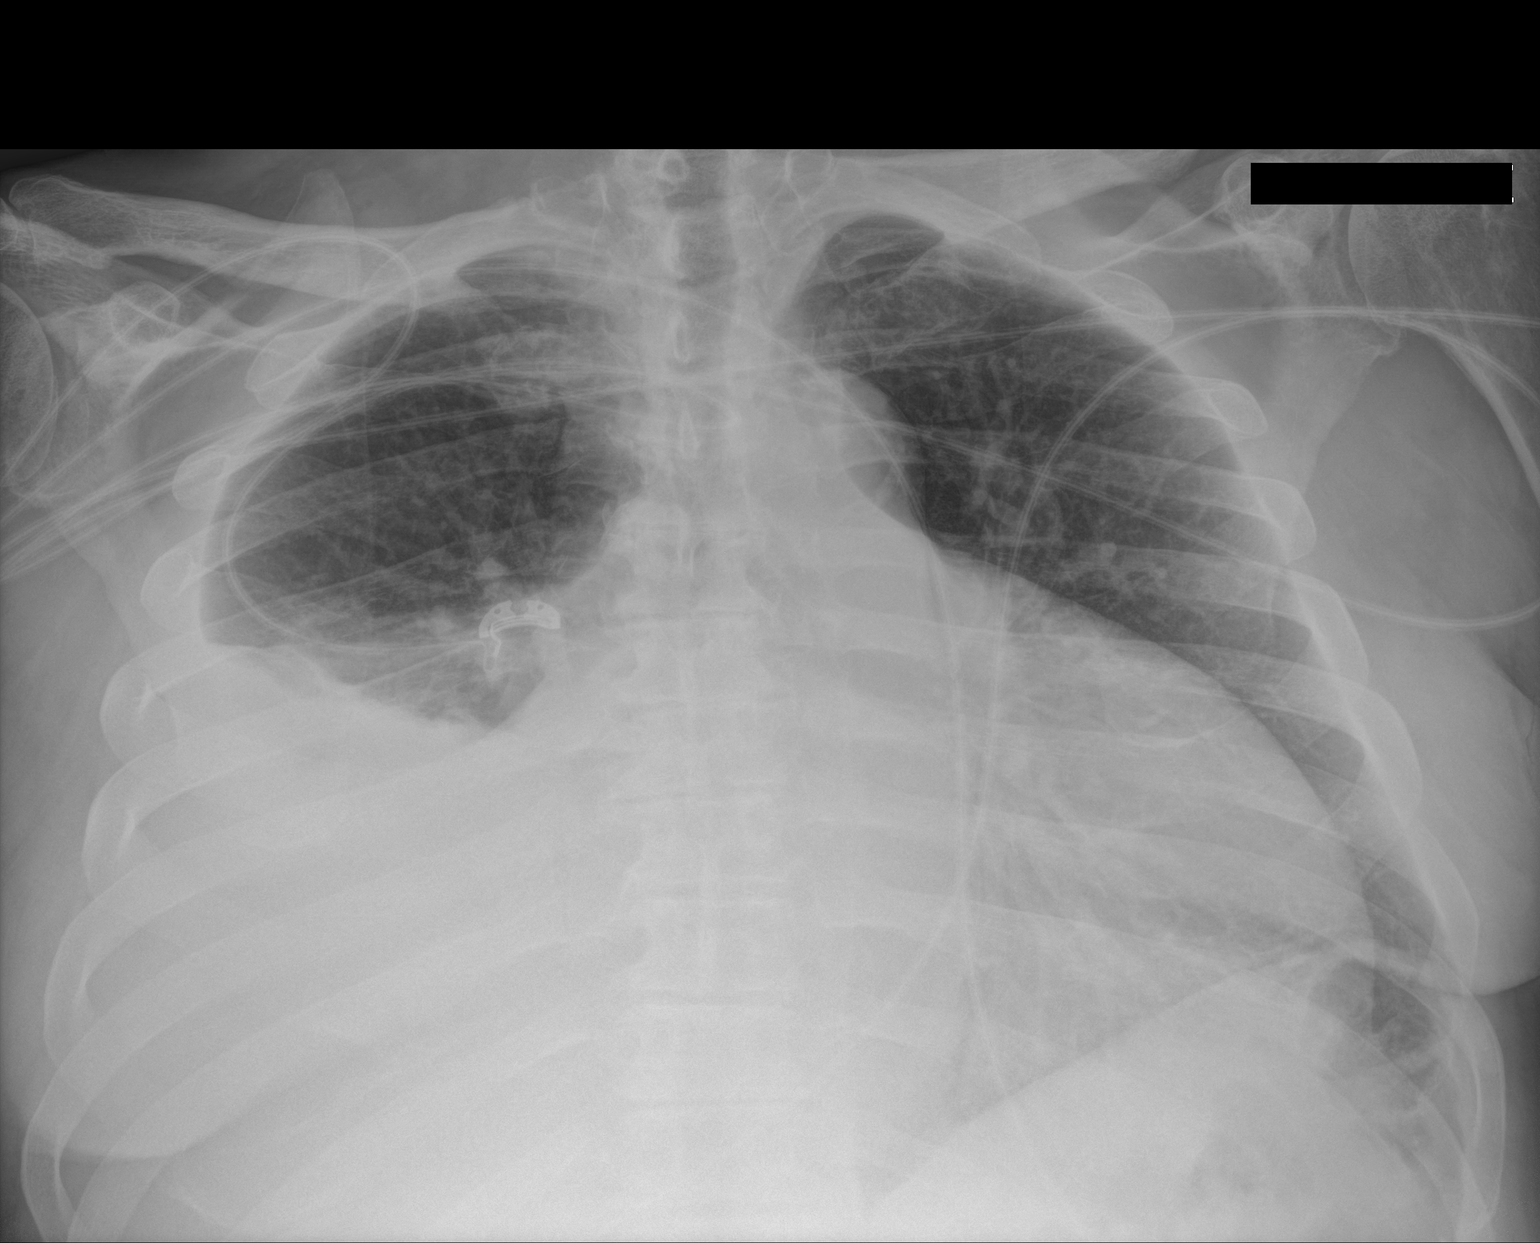

[1 of 1 positions shown; findings below may reference images not displayed]

FINDINGS: The heart is moderately enlarged with a globular appearance.
Moderate right pleural effusion and associated pulmonary opacity in
the right mid and lower lung zones has developed. There is no
pneumothorax. There is no pulmonary edema. There is no pneumothorax
or vascular congestion.
IMPRESSION: Right pleural effusion and basilar pulmonary opacity.

Cardiomegaly is noted.

## 2018-02-27 NOTE — Telephone Encounter (Signed)
Called both numbers listed for the patient left messages on both lines for him to call the office back.  Called patients son Aries Townley, whom number is listed he stated he will contact his dad and have him call the office. I did explain the nature of the call, he stated he will ask him to call the office back.

## 2018-02-27 NOTE — Telephone Encounter (Signed)
Could you please try pt again?

## 2018-02-27 NOTE — Telephone Encounter (Signed)
Would our building work for him?  If so, I will set him up across the hall.

## 2018-02-27 NOTE — Telephone Encounter (Signed)
Patient stated that will be fine for our building

## 2018-02-27 NOTE — Telephone Encounter (Signed)
Patient returned call and stated that he would prefer to go to an office that is closer to his home (somewhere near [employer]) if not the home will be okay.

## 2018-02-28 DIAGNOSIS — I4891 Unspecified atrial fibrillation: Secondary | ICD-10-CM | POA: Diagnosis not present

## 2018-02-28 DIAGNOSIS — N2581 Secondary hyperparathyroidism of renal origin: Secondary | ICD-10-CM | POA: Diagnosis not present

## 2018-02-28 DIAGNOSIS — D509 Iron deficiency anemia, unspecified: Secondary | ICD-10-CM | POA: Diagnosis not present

## 2018-02-28 DIAGNOSIS — D631 Anemia in chronic kidney disease: Secondary | ICD-10-CM | POA: Diagnosis not present

## 2018-02-28 DIAGNOSIS — N186 End stage renal disease: Secondary | ICD-10-CM | POA: Diagnosis not present

## 2018-02-28 DIAGNOSIS — Z992 Dependence on renal dialysis: Secondary | ICD-10-CM | POA: Diagnosis not present

## 2018-02-28 NOTE — Telephone Encounter (Signed)
Order placed for [facility] PT.

## 2018-03-02 DIAGNOSIS — Z992 Dependence on renal dialysis: Secondary | ICD-10-CM | POA: Diagnosis not present

## 2018-03-02 DIAGNOSIS — N186 End stage renal disease: Secondary | ICD-10-CM | POA: Diagnosis not present

## 2018-03-02 DIAGNOSIS — N2581 Secondary hyperparathyroidism of renal origin: Secondary | ICD-10-CM | POA: Diagnosis not present

## 2018-03-02 DIAGNOSIS — I4891 Unspecified atrial fibrillation: Secondary | ICD-10-CM | POA: Diagnosis not present

## 2018-03-02 DIAGNOSIS — D631 Anemia in chronic kidney disease: Secondary | ICD-10-CM | POA: Diagnosis not present

## 2018-03-02 DIAGNOSIS — D509 Iron deficiency anemia, unspecified: Secondary | ICD-10-CM | POA: Diagnosis not present

## 2018-03-03 DIAGNOSIS — N186 End stage renal disease: Secondary | ICD-10-CM | POA: Diagnosis not present

## 2018-03-03 DIAGNOSIS — N2581 Secondary hyperparathyroidism of renal origin: Secondary | ICD-10-CM | POA: Diagnosis not present

## 2018-03-03 DIAGNOSIS — D509 Iron deficiency anemia, unspecified: Secondary | ICD-10-CM | POA: Diagnosis not present

## 2018-03-03 DIAGNOSIS — Z992 Dependence on renal dialysis: Secondary | ICD-10-CM | POA: Diagnosis not present

## 2018-03-03 DIAGNOSIS — I4891 Unspecified atrial fibrillation: Secondary | ICD-10-CM | POA: Diagnosis not present

## 2018-03-03 DIAGNOSIS — D631 Anemia in chronic kidney disease: Secondary | ICD-10-CM | POA: Diagnosis not present

## 2018-03-05 DIAGNOSIS — I4891 Unspecified atrial fibrillation: Secondary | ICD-10-CM | POA: Diagnosis not present

## 2018-03-05 DIAGNOSIS — D631 Anemia in chronic kidney disease: Secondary | ICD-10-CM | POA: Diagnosis not present

## 2018-03-05 DIAGNOSIS — D509 Iron deficiency anemia, unspecified: Secondary | ICD-10-CM | POA: Diagnosis not present

## 2018-03-05 DIAGNOSIS — N2581 Secondary hyperparathyroidism of renal origin: Secondary | ICD-10-CM | POA: Diagnosis not present

## 2018-03-05 DIAGNOSIS — N186 End stage renal disease: Secondary | ICD-10-CM | POA: Diagnosis not present

## 2018-03-05 DIAGNOSIS — Z992 Dependence on renal dialysis: Secondary | ICD-10-CM | POA: Diagnosis not present

## 2018-03-07 DIAGNOSIS — N2581 Secondary hyperparathyroidism of renal origin: Secondary | ICD-10-CM | POA: Diagnosis not present

## 2018-03-07 DIAGNOSIS — D509 Iron deficiency anemia, unspecified: Secondary | ICD-10-CM | POA: Diagnosis not present

## 2018-03-07 DIAGNOSIS — Z992 Dependence on renal dialysis: Secondary | ICD-10-CM | POA: Diagnosis not present

## 2018-03-07 DIAGNOSIS — N186 End stage renal disease: Secondary | ICD-10-CM | POA: Diagnosis not present

## 2018-03-07 DIAGNOSIS — D631 Anemia in chronic kidney disease: Secondary | ICD-10-CM | POA: Diagnosis not present

## 2018-03-07 DIAGNOSIS — I4891 Unspecified atrial fibrillation: Secondary | ICD-10-CM | POA: Diagnosis not present

## 2018-03-07 LAB — POCT INR: INR: 2.9 — AB (ref 0.9–1.1)

## 2018-03-09 DIAGNOSIS — I4891 Unspecified atrial fibrillation: Secondary | ICD-10-CM | POA: Diagnosis not present

## 2018-03-09 DIAGNOSIS — D631 Anemia in chronic kidney disease: Secondary | ICD-10-CM | POA: Diagnosis not present

## 2018-03-09 DIAGNOSIS — N186 End stage renal disease: Secondary | ICD-10-CM | POA: Diagnosis not present

## 2018-03-09 DIAGNOSIS — D509 Iron deficiency anemia, unspecified: Secondary | ICD-10-CM | POA: Diagnosis not present

## 2018-03-09 DIAGNOSIS — N2581 Secondary hyperparathyroidism of renal origin: Secondary | ICD-10-CM | POA: Diagnosis not present

## 2018-03-09 DIAGNOSIS — Z992 Dependence on renal dialysis: Secondary | ICD-10-CM | POA: Diagnosis not present

## 2018-03-10 DIAGNOSIS — D631 Anemia in chronic kidney disease: Secondary | ICD-10-CM | POA: Diagnosis not present

## 2018-03-10 DIAGNOSIS — N186 End stage renal disease: Secondary | ICD-10-CM | POA: Diagnosis not present

## 2018-03-10 DIAGNOSIS — I4891 Unspecified atrial fibrillation: Secondary | ICD-10-CM | POA: Diagnosis not present

## 2018-03-10 DIAGNOSIS — Z992 Dependence on renal dialysis: Secondary | ICD-10-CM | POA: Diagnosis not present

## 2018-03-10 DIAGNOSIS — N2581 Secondary hyperparathyroidism of renal origin: Secondary | ICD-10-CM | POA: Diagnosis not present

## 2018-03-10 DIAGNOSIS — D509 Iron deficiency anemia, unspecified: Secondary | ICD-10-CM | POA: Diagnosis not present

## 2018-03-12 DIAGNOSIS — I4891 Unspecified atrial fibrillation: Secondary | ICD-10-CM | POA: Diagnosis not present

## 2018-03-12 DIAGNOSIS — Z992 Dependence on renal dialysis: Secondary | ICD-10-CM | POA: Diagnosis not present

## 2018-03-12 DIAGNOSIS — N2581 Secondary hyperparathyroidism of renal origin: Secondary | ICD-10-CM | POA: Diagnosis not present

## 2018-03-12 DIAGNOSIS — N186 End stage renal disease: Secondary | ICD-10-CM | POA: Diagnosis not present

## 2018-03-12 DIAGNOSIS — D631 Anemia in chronic kidney disease: Secondary | ICD-10-CM | POA: Diagnosis not present

## 2018-03-12 DIAGNOSIS — D509 Iron deficiency anemia, unspecified: Secondary | ICD-10-CM | POA: Diagnosis not present

## 2018-03-13 ENCOUNTER — Telehealth: Payer: Self-pay | Admitting: *Deleted

## 2018-03-13 NOTE — Telephone Encounter (Signed)
Received fax from the Dialysis Center with INR from 03/07/18 of 2.89. Pt currently taking Coumadin 7.5mg  once a day except he takes 1/2 tablet on Monday and Wednesdays.  Please advise?

## 2018-03-13 NOTE — Telephone Encounter (Signed)
Continue current dosing, repeat INR in 2 weeks.

## 2018-03-13 NOTE — Telephone Encounter (Signed)
Left message for pt to return my call. [place] for [facility] / triage to discuss with pt. Order has already been faxed to Dialysis center to recheck INR in 2 weeks.

## 2018-03-14 DIAGNOSIS — D631 Anemia in chronic kidney disease: Secondary | ICD-10-CM | POA: Diagnosis not present

## 2018-03-14 DIAGNOSIS — N2581 Secondary hyperparathyroidism of renal origin: Secondary | ICD-10-CM | POA: Diagnosis not present

## 2018-03-14 DIAGNOSIS — Z992 Dependence on renal dialysis: Secondary | ICD-10-CM | POA: Diagnosis not present

## 2018-03-14 DIAGNOSIS — D509 Iron deficiency anemia, unspecified: Secondary | ICD-10-CM | POA: Diagnosis not present

## 2018-03-14 DIAGNOSIS — I4891 Unspecified atrial fibrillation: Secondary | ICD-10-CM | POA: Diagnosis not present

## 2018-03-14 DIAGNOSIS — N186 End stage renal disease: Secondary | ICD-10-CM | POA: Diagnosis not present

## 2018-03-14 NOTE — Telephone Encounter (Signed)
Previous fax attempt has failed x 2. Faxed again and confirmed with Dialysis Center that fax has been received.

## 2018-03-15 ENCOUNTER — Encounter: Payer: Self-pay | Admitting: Physical Therapy

## 2018-03-15 ENCOUNTER — Telehealth: Payer: Self-pay | Admitting: Family

## 2018-03-15 ENCOUNTER — Ambulatory Visit: Payer: Medicare Other | Admitting: Physical Therapy

## 2018-03-15 NOTE — Therapy (Signed)
[facility] [address]  [place], [place], [address] Phone: [phone]   Fax:  [phone]  Patient Details  Name: Bruce Cole MRN: [id number] Date of Birth: October 30, 1940 Referring Provider:  Debbrah Alar, NP  Encounter Date: 03/15/2018   Patient arrived to session at wrong appointment time. Reporting that he required 2 rest breaks just to get to the PT clinic d/t fatigue. Brought patient back who was agreeable to PT initally. Offered other options such as home health PT which may be more appropriate for patient due to low endurance. Patient initially agreeable to home heath, but when told that he must speak to his referring provider before making the transition to home health, changed his mind about PT altogether and reported "I will do it myself." Left session without being seen.    Janene Winnie, PT, DPT 03/15/18 2:25 PM   [facility] [address]  [place], [place], [address] Phone: [phone]   Fax:  [phone]

## 2018-03-15 NOTE — Telephone Encounter (Signed)
-----   Message from June [place], PT sent at 03/15/2018  2:28 PM EDT ----- Hello,  I was scheduled to see this patient for a re-eval today since he was a transfer from [place]. However, the patient reported needing 2 rest breaks just to get to the clinic from his car. I recommended home health PT, as this was also recommended by his previous PT. The patient was initially agreeable to this idea until I mentioned that he would need approval from his referring provider. The patient left the clinic, reporting that he did not want to do PT anymore due to the delays he has experienced. I just wanted to give you a heads up about this situation.   Thanks,  Janene Timithy, PT, DPT 03/15/18 2:31 PM

## 2018-03-15 NOTE — Telephone Encounter (Signed)
Could you please contact pt and let him know that I would recommend home health PT.  If he is agreeable, I will place the referral.

## 2018-03-16 DIAGNOSIS — N186 End stage renal disease: Secondary | ICD-10-CM | POA: Diagnosis not present

## 2018-03-16 DIAGNOSIS — N2581 Secondary hyperparathyroidism of renal origin: Secondary | ICD-10-CM | POA: Diagnosis not present

## 2018-03-16 DIAGNOSIS — I4891 Unspecified atrial fibrillation: Secondary | ICD-10-CM | POA: Diagnosis not present

## 2018-03-16 DIAGNOSIS — D631 Anemia in chronic kidney disease: Secondary | ICD-10-CM | POA: Diagnosis not present

## 2018-03-16 DIAGNOSIS — Z992 Dependence on renal dialysis: Secondary | ICD-10-CM | POA: Diagnosis not present

## 2018-03-16 DIAGNOSIS — D509 Iron deficiency anemia, unspecified: Secondary | ICD-10-CM | POA: Diagnosis not present

## 2018-03-16 NOTE — Telephone Encounter (Signed)
Left detailed message on pt's voicemail to let us know if he is agreeable to proceed with Home Health PT and we will not place referral until we hear back from him. [place] for [facility] / triage to discuss with pt.

## 2018-03-17 DIAGNOSIS — I4891 Unspecified atrial fibrillation: Secondary | ICD-10-CM | POA: Diagnosis not present

## 2018-03-17 DIAGNOSIS — D509 Iron deficiency anemia, unspecified: Secondary | ICD-10-CM | POA: Diagnosis not present

## 2018-03-17 DIAGNOSIS — Z992 Dependence on renal dialysis: Secondary | ICD-10-CM | POA: Diagnosis not present

## 2018-03-17 DIAGNOSIS — D631 Anemia in chronic kidney disease: Secondary | ICD-10-CM | POA: Diagnosis not present

## 2018-03-17 DIAGNOSIS — N2581 Secondary hyperparathyroidism of renal origin: Secondary | ICD-10-CM | POA: Diagnosis not present

## 2018-03-17 DIAGNOSIS — N186 End stage renal disease: Secondary | ICD-10-CM | POA: Diagnosis not present

## 2018-03-18 DIAGNOSIS — I158 Other secondary hypertension: Secondary | ICD-10-CM | POA: Diagnosis not present

## 2018-03-18 DIAGNOSIS — Z992 Dependence on renal dialysis: Secondary | ICD-10-CM | POA: Diagnosis not present

## 2018-03-18 DIAGNOSIS — N186 End stage renal disease: Secondary | ICD-10-CM | POA: Diagnosis not present

## 2018-03-19 DIAGNOSIS — N186 End stage renal disease: Secondary | ICD-10-CM | POA: Diagnosis not present

## 2018-03-19 DIAGNOSIS — Z23 Encounter for immunization: Secondary | ICD-10-CM | POA: Diagnosis not present

## 2018-03-19 DIAGNOSIS — D508 Other iron deficiency anemias: Secondary | ICD-10-CM | POA: Diagnosis not present

## 2018-03-19 DIAGNOSIS — I132 Hypertensive heart and chronic kidney disease with heart failure and with stage 5 chronic kidney disease, or end stage renal disease: Secondary | ICD-10-CM | POA: Diagnosis not present

## 2018-03-19 DIAGNOSIS — N2581 Secondary hyperparathyroidism of renal origin: Secondary | ICD-10-CM | POA: Diagnosis not present

## 2018-03-19 DIAGNOSIS — I5042 Chronic combined systolic (congestive) and diastolic (congestive) heart failure: Secondary | ICD-10-CM | POA: Diagnosis not present

## 2018-03-19 DIAGNOSIS — Z992 Dependence on renal dialysis: Secondary | ICD-10-CM | POA: Diagnosis not present

## 2018-03-19 DIAGNOSIS — D631 Anemia in chronic kidney disease: Secondary | ICD-10-CM | POA: Diagnosis not present

## 2018-03-19 DIAGNOSIS — I4891 Unspecified atrial fibrillation: Secondary | ICD-10-CM | POA: Diagnosis not present

## 2018-03-19 DIAGNOSIS — E8779 Other fluid overload: Secondary | ICD-10-CM | POA: Diagnosis not present

## 2018-03-21 DIAGNOSIS — N186 End stage renal disease: Secondary | ICD-10-CM | POA: Diagnosis not present

## 2018-03-21 DIAGNOSIS — I4891 Unspecified atrial fibrillation: Secondary | ICD-10-CM | POA: Diagnosis not present

## 2018-03-21 DIAGNOSIS — Z23 Encounter for immunization: Secondary | ICD-10-CM | POA: Diagnosis not present

## 2018-03-21 DIAGNOSIS — D508 Other iron deficiency anemias: Secondary | ICD-10-CM | POA: Diagnosis not present

## 2018-03-21 DIAGNOSIS — N2581 Secondary hyperparathyroidism of renal origin: Secondary | ICD-10-CM | POA: Diagnosis not present

## 2018-03-21 DIAGNOSIS — D631 Anemia in chronic kidney disease: Secondary | ICD-10-CM | POA: Diagnosis not present

## 2018-03-23 DIAGNOSIS — N2581 Secondary hyperparathyroidism of renal origin: Secondary | ICD-10-CM | POA: Diagnosis not present

## 2018-03-23 DIAGNOSIS — N186 End stage renal disease: Secondary | ICD-10-CM | POA: Diagnosis not present

## 2018-03-23 DIAGNOSIS — D631 Anemia in chronic kidney disease: Secondary | ICD-10-CM | POA: Diagnosis not present

## 2018-03-23 DIAGNOSIS — Z23 Encounter for immunization: Secondary | ICD-10-CM | POA: Diagnosis not present

## 2018-03-23 DIAGNOSIS — D508 Other iron deficiency anemias: Secondary | ICD-10-CM | POA: Diagnosis not present

## 2018-03-23 DIAGNOSIS — I4891 Unspecified atrial fibrillation: Secondary | ICD-10-CM | POA: Diagnosis not present

## 2018-03-23 LAB — POCT INR: INR: 2.5 — AB (ref 0.9–1.1)

## 2018-03-24 DIAGNOSIS — N186 End stage renal disease: Secondary | ICD-10-CM | POA: Diagnosis not present

## 2018-03-24 DIAGNOSIS — Z23 Encounter for immunization: Secondary | ICD-10-CM | POA: Diagnosis not present

## 2018-03-24 DIAGNOSIS — D508 Other iron deficiency anemias: Secondary | ICD-10-CM | POA: Diagnosis not present

## 2018-03-24 DIAGNOSIS — N2581 Secondary hyperparathyroidism of renal origin: Secondary | ICD-10-CM | POA: Diagnosis not present

## 2018-03-24 DIAGNOSIS — D631 Anemia in chronic kidney disease: Secondary | ICD-10-CM | POA: Diagnosis not present

## 2018-03-24 DIAGNOSIS — I4891 Unspecified atrial fibrillation: Secondary | ICD-10-CM | POA: Diagnosis not present

## 2018-03-26 ENCOUNTER — Telehealth: Payer: Self-pay | Admitting: *Deleted

## 2018-03-26 DIAGNOSIS — N186 End stage renal disease: Secondary | ICD-10-CM | POA: Diagnosis not present

## 2018-03-26 DIAGNOSIS — I4891 Unspecified atrial fibrillation: Secondary | ICD-10-CM | POA: Diagnosis not present

## 2018-03-26 DIAGNOSIS — D508 Other iron deficiency anemias: Secondary | ICD-10-CM | POA: Diagnosis not present

## 2018-03-26 DIAGNOSIS — D631 Anemia in chronic kidney disease: Secondary | ICD-10-CM | POA: Diagnosis not present

## 2018-03-26 DIAGNOSIS — Z23 Encounter for immunization: Secondary | ICD-10-CM | POA: Diagnosis not present

## 2018-03-26 DIAGNOSIS — N2581 Secondary hyperparathyroidism of renal origin: Secondary | ICD-10-CM | POA: Diagnosis not present

## 2018-03-26 NOTE — Telephone Encounter (Signed)
Received INR result from dialysis center dated 03/23/18.  INR = 2.52. Pt currently taking Warfarin 7.5mg  except he takes 1/2 tablet on Mondays and Wednesdays. Please advise?

## 2018-03-26 NOTE — Telephone Encounter (Signed)
Continue current dose. Repeat INR in 2 weeks.

## 2018-03-27 NOTE — Telephone Encounter (Signed)
Order faxed to dialysis center @ [phone].

## 2018-03-28 DIAGNOSIS — D508 Other iron deficiency anemias: Secondary | ICD-10-CM | POA: Diagnosis not present

## 2018-03-28 DIAGNOSIS — N2581 Secondary hyperparathyroidism of renal origin: Secondary | ICD-10-CM | POA: Diagnosis not present

## 2018-03-28 DIAGNOSIS — Z23 Encounter for immunization: Secondary | ICD-10-CM | POA: Diagnosis not present

## 2018-03-28 DIAGNOSIS — D631 Anemia in chronic kidney disease: Secondary | ICD-10-CM | POA: Diagnosis not present

## 2018-03-28 DIAGNOSIS — I4891 Unspecified atrial fibrillation: Secondary | ICD-10-CM | POA: Diagnosis not present

## 2018-03-28 DIAGNOSIS — N186 End stage renal disease: Secondary | ICD-10-CM | POA: Diagnosis not present

## 2018-03-28 LAB — POCT INR: INR: 1.8 — AB (ref <=1.1)

## 2018-03-30 DIAGNOSIS — I4891 Unspecified atrial fibrillation: Secondary | ICD-10-CM | POA: Diagnosis not present

## 2018-03-30 DIAGNOSIS — D508 Other iron deficiency anemias: Secondary | ICD-10-CM | POA: Diagnosis not present

## 2018-03-30 DIAGNOSIS — D631 Anemia in chronic kidney disease: Secondary | ICD-10-CM | POA: Diagnosis not present

## 2018-03-30 DIAGNOSIS — N186 End stage renal disease: Secondary | ICD-10-CM | POA: Diagnosis not present

## 2018-03-30 DIAGNOSIS — N2581 Secondary hyperparathyroidism of renal origin: Secondary | ICD-10-CM | POA: Diagnosis not present

## 2018-03-30 DIAGNOSIS — Z23 Encounter for immunization: Secondary | ICD-10-CM | POA: Diagnosis not present

## 2018-03-31 DIAGNOSIS — I4891 Unspecified atrial fibrillation: Secondary | ICD-10-CM | POA: Diagnosis not present

## 2018-03-31 DIAGNOSIS — N186 End stage renal disease: Secondary | ICD-10-CM | POA: Diagnosis not present

## 2018-03-31 DIAGNOSIS — D631 Anemia in chronic kidney disease: Secondary | ICD-10-CM | POA: Diagnosis not present

## 2018-03-31 DIAGNOSIS — N2581 Secondary hyperparathyroidism of renal origin: Secondary | ICD-10-CM | POA: Diagnosis not present

## 2018-03-31 DIAGNOSIS — Z23 Encounter for immunization: Secondary | ICD-10-CM | POA: Diagnosis not present

## 2018-03-31 DIAGNOSIS — D508 Other iron deficiency anemias: Secondary | ICD-10-CM | POA: Diagnosis not present

## 2018-04-02 ENCOUNTER — Telehealth: Payer: Self-pay | Admitting: *Deleted

## 2018-04-02 DIAGNOSIS — I4891 Unspecified atrial fibrillation: Secondary | ICD-10-CM | POA: Diagnosis not present

## 2018-04-02 DIAGNOSIS — D631 Anemia in chronic kidney disease: Secondary | ICD-10-CM | POA: Diagnosis not present

## 2018-04-02 DIAGNOSIS — N186 End stage renal disease: Secondary | ICD-10-CM | POA: Diagnosis not present

## 2018-04-02 DIAGNOSIS — Z23 Encounter for immunization: Secondary | ICD-10-CM | POA: Diagnosis not present

## 2018-04-02 DIAGNOSIS — D508 Other iron deficiency anemias: Secondary | ICD-10-CM | POA: Diagnosis not present

## 2018-04-02 DIAGNOSIS — N2581 Secondary hyperparathyroidism of renal origin: Secondary | ICD-10-CM | POA: Diagnosis not present

## 2018-04-02 NOTE — Telephone Encounter (Signed)
Received PT/INR results from Dialysis Center; forwarded to provider/SLS 09/16

## 2018-04-04 ENCOUNTER — Telehealth: Payer: Self-pay | Admitting: *Deleted

## 2018-04-04 DIAGNOSIS — Z23 Encounter for immunization: Secondary | ICD-10-CM | POA: Diagnosis not present

## 2018-04-04 DIAGNOSIS — D631 Anemia in chronic kidney disease: Secondary | ICD-10-CM | POA: Diagnosis not present

## 2018-04-04 DIAGNOSIS — N186 End stage renal disease: Secondary | ICD-10-CM | POA: Diagnosis not present

## 2018-04-04 DIAGNOSIS — D508 Other iron deficiency anemias: Secondary | ICD-10-CM | POA: Diagnosis not present

## 2018-04-04 DIAGNOSIS — I4891 Unspecified atrial fibrillation: Secondary | ICD-10-CM | POA: Diagnosis not present

## 2018-04-04 DIAGNOSIS — N2581 Secondary hyperparathyroidism of renal origin: Secondary | ICD-10-CM | POA: Diagnosis not present

## 2018-04-04 NOTE — Telephone Encounter (Signed)
Advised patient of change in dosage, he will start taking 1/2 on fridays. He will ask the dialysis center to recheck INR on the 26th insteadt of the 24th.

## 2018-04-04 NOTE — Telephone Encounter (Signed)
I advise the following changes:  TAKE ONE TABLET BY MOUTH DAILY except 1/2 tablet  On fridays  Repeat INR in 10 days.

## 2018-04-04 NOTE — Telephone Encounter (Signed)
Received fax from [facility] with INR result dated 03/28/18. Last result was 03/23/18 and they were advised to recheck in 2 weeks. Spoke with Bruce Cole at Dialysis and she states the check on 03/28/18 is from the monthly standing order.  03/28/18 result is 1.78. He is also scheduled to repeat INR on 04/10/18 for the 2 week recheck. Please advise?

## 2018-04-06 DIAGNOSIS — D508 Other iron deficiency anemias: Secondary | ICD-10-CM | POA: Diagnosis not present

## 2018-04-06 DIAGNOSIS — Z23 Encounter for immunization: Secondary | ICD-10-CM | POA: Diagnosis not present

## 2018-04-06 DIAGNOSIS — I4891 Unspecified atrial fibrillation: Secondary | ICD-10-CM | POA: Diagnosis not present

## 2018-04-06 DIAGNOSIS — D631 Anemia in chronic kidney disease: Secondary | ICD-10-CM | POA: Diagnosis not present

## 2018-04-06 DIAGNOSIS — N2581 Secondary hyperparathyroidism of renal origin: Secondary | ICD-10-CM | POA: Diagnosis not present

## 2018-04-06 DIAGNOSIS — N186 End stage renal disease: Secondary | ICD-10-CM | POA: Diagnosis not present

## 2018-04-07 DIAGNOSIS — I4891 Unspecified atrial fibrillation: Secondary | ICD-10-CM | POA: Diagnosis not present

## 2018-04-07 DIAGNOSIS — D508 Other iron deficiency anemias: Secondary | ICD-10-CM | POA: Diagnosis not present

## 2018-04-07 DIAGNOSIS — N2581 Secondary hyperparathyroidism of renal origin: Secondary | ICD-10-CM | POA: Diagnosis not present

## 2018-04-07 DIAGNOSIS — D631 Anemia in chronic kidney disease: Secondary | ICD-10-CM | POA: Diagnosis not present

## 2018-04-07 DIAGNOSIS — Z23 Encounter for immunization: Secondary | ICD-10-CM | POA: Diagnosis not present

## 2018-04-07 DIAGNOSIS — N186 End stage renal disease: Secondary | ICD-10-CM | POA: Diagnosis not present

## 2018-04-09 DIAGNOSIS — N2581 Secondary hyperparathyroidism of renal origin: Secondary | ICD-10-CM | POA: Diagnosis not present

## 2018-04-09 DIAGNOSIS — D631 Anemia in chronic kidney disease: Secondary | ICD-10-CM | POA: Diagnosis not present

## 2018-04-09 DIAGNOSIS — D508 Other iron deficiency anemias: Secondary | ICD-10-CM | POA: Diagnosis not present

## 2018-04-09 DIAGNOSIS — I4891 Unspecified atrial fibrillation: Secondary | ICD-10-CM | POA: Diagnosis not present

## 2018-04-09 DIAGNOSIS — Z23 Encounter for immunization: Secondary | ICD-10-CM | POA: Diagnosis not present

## 2018-04-09 DIAGNOSIS — N186 End stage renal disease: Secondary | ICD-10-CM | POA: Diagnosis not present

## 2018-04-10 ENCOUNTER — Ambulatory Visit (INDEPENDENT_AMBULATORY_CARE_PROVIDER_SITE_OTHER): Payer: Medicare Other | Admitting: Family

## 2018-04-10 ENCOUNTER — Encounter: Payer: Self-pay | Admitting: Family

## 2018-04-10 VITALS — BP 110/62 | HR 52 | Temp 98.6°F | Resp 18 | Ht 74.0 in | Wt 281.0 lb

## 2018-04-10 DIAGNOSIS — R269 Unspecified abnormalities of gait and mobility: Secondary | ICD-10-CM

## 2018-04-10 DIAGNOSIS — E118 Type 2 diabetes mellitus with unspecified complications: Secondary | ICD-10-CM | POA: Diagnosis not present

## 2018-04-10 DIAGNOSIS — I10 Essential (primary) hypertension: Secondary | ICD-10-CM

## 2018-04-10 DIAGNOSIS — I4891 Unspecified atrial fibrillation: Secondary | ICD-10-CM | POA: Diagnosis not present

## 2018-04-10 DIAGNOSIS — Z5181 Encounter for therapeutic drug level monitoring: Secondary | ICD-10-CM

## 2018-04-10 DIAGNOSIS — N186 End stage renal disease: Secondary | ICD-10-CM

## 2018-04-10 NOTE — Progress Notes (Signed)
Subjective:    Patient ID: Bruce Cole, male    DOB: [birth year], 77 y.o.   MRN: [id number]  HPI  Patient presents today for follow up.  HTN- not on antihypertensives  BP Readings from Last 3 Encounters:  04/10/18 110/62  02/20/18 116/64  01/02/18 99/64   DM2- does not check sugars. Does watch his diet.  Lab Results  Component Value Date   HGBA1C 6.4 01/02/2018   HGBA1C 6.6 (H) 10/03/2017   HGBA1C 5.6 02/20/2017   Lab Results  Component Value Date   MICROALBUR 20.7 (H) 12/16/2014   LDLCALC 51 10/03/2017   CREATININE 7.08 (H) 12/23/2017   ESRD- continues HD on M,W,   Wt Readings from Last 3 Encounters:  04/10/18 281 lb (127.5 kg)  01/02/18 276 lb 12.8 oz (125.6 kg)  12/23/17 250 lb (113.4 kg)     Review of Systems    see HPI  Past Medical History:  Diagnosis Date  . Acute blood loss anemia   . Atrial fibrillation (HCC)    Chronic  . Chronic combined systolic and diastolic heart failure ([place])   . Colon polyp 2000  . Dysrhythmia    hx  . ESRD on hemodialysis ([place])    [facility]  . Essential hypertension   . Gastritis and gastroduodenitis   . GI bleed   . Gout   . Heart murmur   . History of blood transfusion   . History of kidney stones   . Nephrolithiasis   . OSA (obstructive sleep apnea) 09/02/2013    IMPRESSION :  1. Mild obstructive sleep apnea with hypopneas causing sleep fragmentation and moderate oxygen desaturation.  2. Short runs of nonsustained VT were noted. His beta blocker may need to be titrated 3. Significant PLM's were noted, the PLM arousal index was low. Please correlate with clinical history of restless leg syndrome.  4. Sleep efficiency was poor.  RECOMMENDATION:  1. Treatment options for this degree of sleep disordered breathing include weight loss and positional therapy to avoid supine sleep 2. Consider titrating beta blocker further, defer to cardiologist 3. Patient should be cautioned against driving when  sleepy.They should be asked to avoid medications with sedative side effects     . Osteoarthritis of right hip   . Pneumonia    hx 30 yrs ago  . Primary osteoarthritis of right hip   . Rheumatoid arthritis ([place])   . Thrombocytopenia ([place])      Social History   Socioeconomic History  . Marital status: Single    Spouse name: Not on file  . Number of children: 3  . Years of education: Not on file  . Highest education level: Not on file  Occupational History  . Occupation: retired Personnel officer: RETIRED  Social Needs  . Financial resource strain: Not on file  . Food insecurity:    Worry: Not on file    Inability: Not on file  . Transportation needs:    Medical: Not on file    Non-medical: Not on file  Tobacco Use  . Smoking status: Never Smoker  . Smokeless tobacco: Never Used  Substance and Sexual Activity  . Alcohol use: No    Comment: occasional  . Drug use: No  . Sexual activity: Not on file  Lifestyle  . Physical activity:    Days per week: Not on file    Minutes per session: Not on file  . Stress: Not on file  Relationships  . Social connections:    Talks on phone: Not on file    Gets together: Not on file    Attends religious service: Not on file    Active member of club or organization: Not on file    Attends meetings of clubs or organizations: Not on file    Relationship status: Not on file  . Intimate partner violence:    Fear of current or ex partner: Not on file    Emotionally abused: Not on file    Physically abused: Not on file    Forced sexual activity: Not on file  Other Topics Concern  . Not on file  Social History Narrative   Former [employer] [place]   Admitted to [place] 11/25/15   Widowed   Never smoked   FULL CODE    Past Surgical History:  Procedure Laterality Date  . AV FISTULA PLACEMENT Left 08/26/2015   Procedure: LEFT RADIOCEPHALIC FISTULA CREATION;  Surgeon: Rosetta Posner, MD;  Location: [place];   Service: Vascular;  Laterality: Left;  . AV FISTULA PLACEMENT Left 11/23/2015   Procedure: ARTERIOVENOUS (AV) FISTULA CREATION;  Surgeon: Rosetta Posner, MD;  Location: [place];  Service: Vascular;  Laterality: Left;  . BACK SURGERY     x2- discectomy  . CHOLECYSTECTOMY  1994  . CYSTOSCOPY/RETROGRADE/URETEROSCOPY/STONE EXTRACTION WITH BASKET    . ESOPHAGOGASTRODUODENOSCOPY N/A 11/13/2015   Procedure: ESOPHAGOGASTRODUODENOSCOPY (EGD);  Surgeon: Irene Shipper, MD;  Location: [facility] ENDOSCOPY;  Service: Endoscopy;  Laterality: N/A;  . INSERTION OF DIALYSIS CATHETER Left 11/23/2015   Procedure: INSERTION OF DIALYSIS CATHETER;  Surgeon: Rosetta Posner, MD;  Location: [place];  Service: Vascular;  Laterality: Left;  . IR GENERIC HISTORICAL Left 08/04/2016   IR DIALY SHUNT INTRO NEEDLE/INTRACATH INITIAL W/IMG LEFT 08/04/2016 Markus Daft, MD [facility]INTER[facility]  . IR GENERIC HISTORICAL  08/04/2016   IR US GUIDE VASC ACCESS LEFT 08/04/2016 Markus Daft, MD [facility]  . JOINT REPLACEMENT     Total L-Hip replacement, Right Knee 10/20/09  . LEFT AND RIGHT HEART CATHETERIZATION WITH CORONARY ANGIOGRAM N/A 02/22/2013   Procedure: LEFT AND RIGHT HEART CATHETERIZATION WITH CORONARY ANGIOGRAM;  Surgeon: Jolaine Artist, MD;  Location: [facility] CATH LAB;  Service: Cardiovascular;  Laterality: N/A;  . LITHOTRIPSY  90's  . PERIPHERAL VASCULAR CATHETERIZATION Left 11/19/2015   Procedure: A/V/Fistulagram;  Surgeon: Conrad [place]itt, MD;  Location: [place] CV LAB;  Service: Cardiovascular;  Laterality: Left;  . PERIPHERAL VASCULAR CATHETERIZATION Left 07/21/2016   Procedure: A/V Fistulagram;  Surgeon: Conrad Lauderhill, MD;  Location: [place] CV LAB;  Service: Cardiovascular;  Laterality: Left;  arm  . REVISON OF ARTERIOVENOUS FISTULA Left 05/30/2016   Procedure: REVISION LEFT UPPER ARM FISTULA;  Surgeon: Conrad [place] Grove, MD;  Location: [place];  Service: Vascular;  Laterality: Left;  . REVISON OF ARTERIOVENOUS FISTULA Left 07/22/2016   Procedure: REVISON OF  BASILIC VEIN TRANSPOSITION ANASTOMOSIS;  Surgeon: Rosetta Posner, MD;  Location: [place];  Service: Vascular;  Laterality: Left;  . SPINE SURGERY     x 2  . TOTAL HIP ARTHROPLASTY Right 03/22/2016   Procedure: RIGHT TOTAL HIP ARTHROPLASTY ANTERIOR APPROACH;  Surgeon: Mcarthur Rossetti, MD;  Location: [place];  Service: Orthopedics;  Laterality: Right;    Family History  Problem Relation Age of Onset  . Hypertension Mother   . Arthritis Mother        ?RA  . Hypertension Father  Allergies  Allergen Reactions  . Ace Inhibitors Other (See Comments)    Worsening renal insufficiency    Current Outpatient Medications on File Prior to Visit  Medication Sig Dispense Refill  . acetaminophen (TYLENOL) 500 MG tablet Take 500 mg by mouth every 6 (six) hours as needed for mild pain.    [name] albuterol (PROVENTIL) (2.5 MG/3ML) 0.083% nebulizer solution Take 2.5 mg by nebulization every 4 (four) hours as needed for shortness of breath. Reported on 09/25/2015    . atorvastatin (LIPITOR) 10 MG tablet TAKE 1 TABLET(10 MG) BY MOUTH DAILY 90 tablet 1  . B Complex-C-Folic Acid (RENA-VITE RX) 1 MG TABS Take 1 tablet by mouth at bedtime.   6  . colchicine 0.6 MG tablet Take 1/2 tablet by mouth once as needed for gout flare. Call if no improvement 15 tablet 0  . fluticasone (FLONASE) 50 MCG/ACT nasal spray INSTILL 2 SPRAYS INTO BOTH NOSTRILS EVERY DAY 16 g 5  . furosemide (LASIX) 40 MG tablet TAKE 1 TABLET(40 MG) BY MOUTH TWICE DAILY 180 tablet 1  . gabapentin (NEURONTIN) 300 MG capsule 1 tablet by mouth daily at bedtime 90 capsule 1  . hydroxychloroquine (PLAQUENIL) 200 MG tablet TAKE 1 TABLET(200 MG) BY MOUTH DAILY 90 tablet 1  . RENVELA 800 MG tablet Take 3 tablets (2,400 mg total) by mouth 3 (three) times daily with meals. 90 tablet 0  . warfarin (COUMADIN) 7.5 MG tablet TAKE ONE TABLET BY MOUTH DAILY except 1/2 tablet on Monday's and fridays (Patient taking differently: TAKE ONE TABLET BY MOUTH DAILY except  1/2 tablet on Monday's and Wednesdays) 100 tablet 0   No current facility-administered medications on file prior to visit.     BP 110/62 (BP Location: Right Arm, Patient Position: Sitting, Cuff Size: Large)   Pulse (!) 52   Temp 98.6 F (37 C) (Oral)   Resp 18   Ht 6\' 2"  (1.88 m)   Wt 281 lb (127.5 kg)   SpO2 97%   BMI 36.08 kg/m    Objective:   Physical Exam  Constitutional: He is oriented to person, place, and time. He appears well-developed and well-nourished. No distress.  HENT:  Head: Normocephalic and atraumatic.  Cardiovascular: Normal rate and regular rhythm.  Murmur heard. Pulmonary/Chest: Effort normal and breath sounds normal. No respiratory distress. He has no wheezes. He has no rales.  Musculoskeletal: He exhibits no edema.  Neurological: He is alert and oriented to person, place, and time.  Skin: Skin is warm and dry.  Psychiatric: He has a normal mood and affect. His behavior is normal. Thought content normal.          Assessment & Plan:  HTN- BP has been stable off of antihypertensives since he began HD. Monitor.  DM2- has had some weight gain.  He has a stationary bike at home. Discussed weight loss. Obtain follow up A1C, not on DM meds- diet only. He will obtain flu shot at HD>  Gait Disorder-I advised him to work on using his bike and will also arrange home health PT. He was having trouble getting to outpatient PT due to his mobility issues and feels that he still has some gait issues and needs strength training. Especially in his right hip.  AF- rate stable, continues coumadin. Will check follow up INR today.   ESRD- continues HD.  Stable.

## 2018-04-10 NOTE — Patient Instructions (Signed)
Please complete lab work prior to leaving. You should be contacted about scheduling your home health physical therapy.

## 2018-04-11 DIAGNOSIS — Z23 Encounter for immunization: Secondary | ICD-10-CM | POA: Diagnosis not present

## 2018-04-11 DIAGNOSIS — D508 Other iron deficiency anemias: Secondary | ICD-10-CM | POA: Diagnosis not present

## 2018-04-11 DIAGNOSIS — N2581 Secondary hyperparathyroidism of renal origin: Secondary | ICD-10-CM | POA: Diagnosis not present

## 2018-04-11 DIAGNOSIS — I4891 Unspecified atrial fibrillation: Secondary | ICD-10-CM | POA: Diagnosis not present

## 2018-04-11 DIAGNOSIS — D631 Anemia in chronic kidney disease: Secondary | ICD-10-CM | POA: Diagnosis not present

## 2018-04-11 DIAGNOSIS — N186 End stage renal disease: Secondary | ICD-10-CM | POA: Diagnosis not present

## 2018-04-11 LAB — PROTIME-INR
INR: 1.7 ratio — ABNORMAL HIGH (ref 0.8–1.0)
PROTHROMBIN TIME: 19.2 s — AB (ref 9.6–13.1)

## 2018-04-11 LAB — HEMOGLOBIN A1C: Hgb A1c MFr Bld: 6.2 % (ref 4.6–6.5)

## 2018-04-12 ENCOUNTER — Telehealth: Payer: Self-pay | Admitting: Family

## 2018-04-12 DIAGNOSIS — Z5181 Encounter for therapeutic drug level monitoring: Secondary | ICD-10-CM

## 2018-04-12 NOTE — Telephone Encounter (Signed)
Please let pt know INR is low. I would like him to take coumadin 7.5mg  once daily and have INR drawn at hemodialysis in 1 week please.  Please give order to his dialysis center.

## 2018-04-13 ENCOUNTER — Telehealth: Payer: Self-pay | Admitting: *Deleted

## 2018-04-13 DIAGNOSIS — N186 End stage renal disease: Secondary | ICD-10-CM | POA: Diagnosis not present

## 2018-04-13 DIAGNOSIS — Z23 Encounter for immunization: Secondary | ICD-10-CM | POA: Diagnosis not present

## 2018-04-13 DIAGNOSIS — D631 Anemia in chronic kidney disease: Secondary | ICD-10-CM | POA: Diagnosis not present

## 2018-04-13 DIAGNOSIS — D508 Other iron deficiency anemias: Secondary | ICD-10-CM | POA: Diagnosis not present

## 2018-04-13 DIAGNOSIS — I4891 Unspecified atrial fibrillation: Secondary | ICD-10-CM | POA: Diagnosis not present

## 2018-04-13 DIAGNOSIS — N2581 Secondary hyperparathyroidism of renal origin: Secondary | ICD-10-CM | POA: Diagnosis not present

## 2018-04-13 NOTE — Telephone Encounter (Signed)
Author phoned pt. and relayed need to start 7.5mg  of coumadin everyday, and have INR rechecked in one week at dialysis. Pt. Verbalized understanding. Order faxed to [facility] dialysis center [phone].

## 2018-04-13 NOTE — Telephone Encounter (Signed)
Received PT/INR results from [facility]; forwarded to provider/SLS 09/27

## 2018-04-14 DIAGNOSIS — N186 End stage renal disease: Secondary | ICD-10-CM | POA: Diagnosis not present

## 2018-04-14 DIAGNOSIS — N2581 Secondary hyperparathyroidism of renal origin: Secondary | ICD-10-CM | POA: Diagnosis not present

## 2018-04-14 DIAGNOSIS — D508 Other iron deficiency anemias: Secondary | ICD-10-CM | POA: Diagnosis not present

## 2018-04-14 DIAGNOSIS — I4891 Unspecified atrial fibrillation: Secondary | ICD-10-CM | POA: Diagnosis not present

## 2018-04-14 DIAGNOSIS — D631 Anemia in chronic kidney disease: Secondary | ICD-10-CM | POA: Diagnosis not present

## 2018-04-14 DIAGNOSIS — Z23 Encounter for immunization: Secondary | ICD-10-CM | POA: Diagnosis not present

## 2018-04-16 DIAGNOSIS — D508 Other iron deficiency anemias: Secondary | ICD-10-CM | POA: Diagnosis not present

## 2018-04-16 DIAGNOSIS — Z23 Encounter for immunization: Secondary | ICD-10-CM | POA: Diagnosis not present

## 2018-04-16 DIAGNOSIS — N186 End stage renal disease: Secondary | ICD-10-CM | POA: Diagnosis not present

## 2018-04-16 DIAGNOSIS — D631 Anemia in chronic kidney disease: Secondary | ICD-10-CM | POA: Diagnosis not present

## 2018-04-16 DIAGNOSIS — I4891 Unspecified atrial fibrillation: Secondary | ICD-10-CM | POA: Diagnosis not present

## 2018-04-16 DIAGNOSIS — N2581 Secondary hyperparathyroidism of renal origin: Secondary | ICD-10-CM | POA: Diagnosis not present

## 2018-04-17 DIAGNOSIS — Z992 Dependence on renal dialysis: Secondary | ICD-10-CM | POA: Diagnosis not present

## 2018-04-17 DIAGNOSIS — N186 End stage renal disease: Secondary | ICD-10-CM | POA: Diagnosis not present

## 2018-04-17 DIAGNOSIS — I158 Other secondary hypertension: Secondary | ICD-10-CM | POA: Diagnosis not present

## 2018-04-18 DIAGNOSIS — D631 Anemia in chronic kidney disease: Secondary | ICD-10-CM | POA: Diagnosis not present

## 2018-04-18 DIAGNOSIS — E8770 Fluid overload, unspecified: Secondary | ICD-10-CM | POA: Diagnosis not present

## 2018-04-18 DIAGNOSIS — N186 End stage renal disease: Secondary | ICD-10-CM | POA: Diagnosis not present

## 2018-04-18 DIAGNOSIS — I132 Hypertensive heart and chronic kidney disease with heart failure and with stage 5 chronic kidney disease, or end stage renal disease: Secondary | ICD-10-CM | POA: Diagnosis not present

## 2018-04-18 DIAGNOSIS — E8779 Other fluid overload: Secondary | ICD-10-CM | POA: Diagnosis not present

## 2018-04-18 DIAGNOSIS — I5042 Chronic combined systolic (congestive) and diastolic (congestive) heart failure: Secondary | ICD-10-CM | POA: Diagnosis not present

## 2018-04-18 DIAGNOSIS — Z992 Dependence on renal dialysis: Secondary | ICD-10-CM | POA: Diagnosis not present

## 2018-04-18 DIAGNOSIS — N2581 Secondary hyperparathyroidism of renal origin: Secondary | ICD-10-CM | POA: Diagnosis not present

## 2018-04-18 DIAGNOSIS — I4891 Unspecified atrial fibrillation: Secondary | ICD-10-CM | POA: Diagnosis not present

## 2018-04-18 DIAGNOSIS — D508 Other iron deficiency anemias: Secondary | ICD-10-CM | POA: Diagnosis not present

## 2018-04-20 DIAGNOSIS — D631 Anemia in chronic kidney disease: Secondary | ICD-10-CM | POA: Diagnosis not present

## 2018-04-20 DIAGNOSIS — D508 Other iron deficiency anemias: Secondary | ICD-10-CM | POA: Diagnosis not present

## 2018-04-20 DIAGNOSIS — N186 End stage renal disease: Secondary | ICD-10-CM | POA: Diagnosis not present

## 2018-04-20 DIAGNOSIS — I4891 Unspecified atrial fibrillation: Secondary | ICD-10-CM | POA: Diagnosis not present

## 2018-04-20 DIAGNOSIS — Z992 Dependence on renal dialysis: Secondary | ICD-10-CM | POA: Diagnosis not present

## 2018-04-20 DIAGNOSIS — N2581 Secondary hyperparathyroidism of renal origin: Secondary | ICD-10-CM | POA: Diagnosis not present

## 2018-04-21 DIAGNOSIS — I4891 Unspecified atrial fibrillation: Secondary | ICD-10-CM | POA: Diagnosis not present

## 2018-04-21 DIAGNOSIS — D508 Other iron deficiency anemias: Secondary | ICD-10-CM | POA: Diagnosis not present

## 2018-04-21 DIAGNOSIS — D631 Anemia in chronic kidney disease: Secondary | ICD-10-CM | POA: Diagnosis not present

## 2018-04-21 DIAGNOSIS — N186 End stage renal disease: Secondary | ICD-10-CM | POA: Diagnosis not present

## 2018-04-21 DIAGNOSIS — Z992 Dependence on renal dialysis: Secondary | ICD-10-CM | POA: Diagnosis not present

## 2018-04-21 DIAGNOSIS — N2581 Secondary hyperparathyroidism of renal origin: Secondary | ICD-10-CM | POA: Diagnosis not present

## 2018-04-23 DIAGNOSIS — D508 Other iron deficiency anemias: Secondary | ICD-10-CM | POA: Diagnosis not present

## 2018-04-23 DIAGNOSIS — I4891 Unspecified atrial fibrillation: Secondary | ICD-10-CM | POA: Diagnosis not present

## 2018-04-23 DIAGNOSIS — N186 End stage renal disease: Secondary | ICD-10-CM | POA: Diagnosis not present

## 2018-04-23 DIAGNOSIS — D631 Anemia in chronic kidney disease: Secondary | ICD-10-CM | POA: Diagnosis not present

## 2018-04-23 DIAGNOSIS — Z992 Dependence on renal dialysis: Secondary | ICD-10-CM | POA: Diagnosis not present

## 2018-04-23 DIAGNOSIS — N2581 Secondary hyperparathyroidism of renal origin: Secondary | ICD-10-CM | POA: Diagnosis not present

## 2018-04-24 DIAGNOSIS — M069 Rheumatoid arthritis, unspecified: Secondary | ICD-10-CM | POA: Diagnosis not present

## 2018-04-24 DIAGNOSIS — I5042 Chronic combined systolic (congestive) and diastolic (congestive) heart failure: Secondary | ICD-10-CM | POA: Diagnosis not present

## 2018-04-24 DIAGNOSIS — Z471 Aftercare following joint replacement surgery: Secondary | ICD-10-CM | POA: Diagnosis not present

## 2018-04-24 DIAGNOSIS — I482 Chronic atrial fibrillation, unspecified: Secondary | ICD-10-CM | POA: Diagnosis not present

## 2018-04-24 DIAGNOSIS — E1122 Type 2 diabetes mellitus with diabetic chronic kidney disease: Secondary | ICD-10-CM | POA: Diagnosis not present

## 2018-04-24 DIAGNOSIS — M109 Gout, unspecified: Secondary | ICD-10-CM | POA: Diagnosis not present

## 2018-04-24 DIAGNOSIS — G4733 Obstructive sleep apnea (adult) (pediatric): Secondary | ICD-10-CM | POA: Diagnosis not present

## 2018-04-24 DIAGNOSIS — I132 Hypertensive heart and chronic kidney disease with heart failure and with stage 5 chronic kidney disease, or end stage renal disease: Secondary | ICD-10-CM | POA: Diagnosis not present

## 2018-04-24 DIAGNOSIS — Z96641 Presence of right artificial hip joint: Secondary | ICD-10-CM | POA: Diagnosis not present

## 2018-04-24 DIAGNOSIS — N186 End stage renal disease: Secondary | ICD-10-CM | POA: Diagnosis not present

## 2018-04-25 DIAGNOSIS — Z992 Dependence on renal dialysis: Secondary | ICD-10-CM | POA: Diagnosis not present

## 2018-04-25 DIAGNOSIS — I4891 Unspecified atrial fibrillation: Secondary | ICD-10-CM | POA: Diagnosis not present

## 2018-04-25 DIAGNOSIS — N2581 Secondary hyperparathyroidism of renal origin: Secondary | ICD-10-CM | POA: Diagnosis not present

## 2018-04-25 DIAGNOSIS — D508 Other iron deficiency anemias: Secondary | ICD-10-CM | POA: Diagnosis not present

## 2018-04-25 DIAGNOSIS — D631 Anemia in chronic kidney disease: Secondary | ICD-10-CM | POA: Diagnosis not present

## 2018-04-25 DIAGNOSIS — N186 End stage renal disease: Secondary | ICD-10-CM | POA: Diagnosis not present

## 2018-04-27 ENCOUNTER — Telehealth: Payer: Self-pay | Admitting: Family

## 2018-04-27 ENCOUNTER — Telehealth: Payer: Self-pay | Admitting: *Deleted

## 2018-04-27 DIAGNOSIS — I4891 Unspecified atrial fibrillation: Secondary | ICD-10-CM | POA: Diagnosis not present

## 2018-04-27 DIAGNOSIS — Z992 Dependence on renal dialysis: Secondary | ICD-10-CM | POA: Diagnosis not present

## 2018-04-27 DIAGNOSIS — N2581 Secondary hyperparathyroidism of renal origin: Secondary | ICD-10-CM | POA: Diagnosis not present

## 2018-04-27 DIAGNOSIS — N186 End stage renal disease: Secondary | ICD-10-CM | POA: Diagnosis not present

## 2018-04-27 DIAGNOSIS — D631 Anemia in chronic kidney disease: Secondary | ICD-10-CM | POA: Diagnosis not present

## 2018-04-27 DIAGNOSIS — D508 Other iron deficiency anemias: Secondary | ICD-10-CM | POA: Diagnosis not present

## 2018-04-27 MED ORDER — WARFARIN SODIUM 7.5 MG PO TABS: ORAL_TABLET | ORAL | 0 refills | Status: DC

## 2018-04-27 NOTE — Telephone Encounter (Signed)
Received PT/INR results from [facility]; forwarded to provider/SLS 10/11

## 2018-04-27 NOTE — Telephone Encounter (Signed)
[name]. Pt reports taking 7.5 mg daily except one day a week, 1/2 tab. Advised pt to take full tab every day.  Rod Holler please contact his dialysis center and advise them to recheck INR in 2 weeks please.

## 2018-04-28 DIAGNOSIS — N2581 Secondary hyperparathyroidism of renal origin: Secondary | ICD-10-CM | POA: Diagnosis not present

## 2018-04-28 DIAGNOSIS — Z992 Dependence on renal dialysis: Secondary | ICD-10-CM | POA: Diagnosis not present

## 2018-04-28 DIAGNOSIS — N186 End stage renal disease: Secondary | ICD-10-CM | POA: Diagnosis not present

## 2018-04-28 DIAGNOSIS — I4891 Unspecified atrial fibrillation: Secondary | ICD-10-CM | POA: Diagnosis not present

## 2018-04-28 DIAGNOSIS — D631 Anemia in chronic kidney disease: Secondary | ICD-10-CM | POA: Diagnosis not present

## 2018-04-28 DIAGNOSIS — D508 Other iron deficiency anemias: Secondary | ICD-10-CM | POA: Diagnosis not present

## 2018-04-28 LAB — POCT INR: INR: 2.6 — AB (ref <=1.1)

## 2018-04-30 DIAGNOSIS — N2581 Secondary hyperparathyroidism of renal origin: Secondary | ICD-10-CM | POA: Diagnosis not present

## 2018-04-30 DIAGNOSIS — D508 Other iron deficiency anemias: Secondary | ICD-10-CM | POA: Diagnosis not present

## 2018-04-30 DIAGNOSIS — N186 End stage renal disease: Secondary | ICD-10-CM | POA: Diagnosis not present

## 2018-04-30 DIAGNOSIS — Z992 Dependence on renal dialysis: Secondary | ICD-10-CM | POA: Diagnosis not present

## 2018-04-30 DIAGNOSIS — D631 Anemia in chronic kidney disease: Secondary | ICD-10-CM | POA: Diagnosis not present

## 2018-04-30 DIAGNOSIS — I4891 Unspecified atrial fibrillation: Secondary | ICD-10-CM | POA: Diagnosis not present

## 2018-04-30 NOTE — Telephone Encounter (Signed)
Patient advised to increase medication to 7.5 daily. He said he will let the dialysis center know about the medication change and to check it again in 2 weeks. I told patient to call me and let me know if they need an order for this.

## 2018-05-01 DIAGNOSIS — Z471 Aftercare following joint replacement surgery: Secondary | ICD-10-CM | POA: Diagnosis not present

## 2018-05-01 DIAGNOSIS — I132 Hypertensive heart and chronic kidney disease with heart failure and with stage 5 chronic kidney disease, or end stage renal disease: Secondary | ICD-10-CM | POA: Diagnosis not present

## 2018-05-01 DIAGNOSIS — M069 Rheumatoid arthritis, unspecified: Secondary | ICD-10-CM | POA: Diagnosis not present

## 2018-05-01 DIAGNOSIS — I5042 Chronic combined systolic (congestive) and diastolic (congestive) heart failure: Secondary | ICD-10-CM | POA: Diagnosis not present

## 2018-05-01 DIAGNOSIS — E1122 Type 2 diabetes mellitus with diabetic chronic kidney disease: Secondary | ICD-10-CM | POA: Diagnosis not present

## 2018-05-01 DIAGNOSIS — N186 End stage renal disease: Secondary | ICD-10-CM | POA: Diagnosis not present

## 2018-05-02 DIAGNOSIS — Z992 Dependence on renal dialysis: Secondary | ICD-10-CM | POA: Diagnosis not present

## 2018-05-02 DIAGNOSIS — D508 Other iron deficiency anemias: Secondary | ICD-10-CM | POA: Diagnosis not present

## 2018-05-02 DIAGNOSIS — N2581 Secondary hyperparathyroidism of renal origin: Secondary | ICD-10-CM | POA: Diagnosis not present

## 2018-05-02 DIAGNOSIS — I4891 Unspecified atrial fibrillation: Secondary | ICD-10-CM | POA: Diagnosis not present

## 2018-05-02 DIAGNOSIS — D631 Anemia in chronic kidney disease: Secondary | ICD-10-CM | POA: Diagnosis not present

## 2018-05-02 DIAGNOSIS — N186 End stage renal disease: Secondary | ICD-10-CM | POA: Diagnosis not present

## 2018-05-02 NOTE — Telephone Encounter (Signed)
Orders faxed

## 2018-05-02 NOTE — Telephone Encounter (Signed)
Could you please fax order to dialysis center? [phone].

## 2018-05-03 DIAGNOSIS — Z471 Aftercare following joint replacement surgery: Secondary | ICD-10-CM | POA: Diagnosis not present

## 2018-05-03 DIAGNOSIS — M069 Rheumatoid arthritis, unspecified: Secondary | ICD-10-CM | POA: Diagnosis not present

## 2018-05-03 DIAGNOSIS — E1122 Type 2 diabetes mellitus with diabetic chronic kidney disease: Secondary | ICD-10-CM | POA: Diagnosis not present

## 2018-05-03 DIAGNOSIS — N186 End stage renal disease: Secondary | ICD-10-CM | POA: Diagnosis not present

## 2018-05-03 DIAGNOSIS — I132 Hypertensive heart and chronic kidney disease with heart failure and with stage 5 chronic kidney disease, or end stage renal disease: Secondary | ICD-10-CM | POA: Diagnosis not present

## 2018-05-03 DIAGNOSIS — I5042 Chronic combined systolic (congestive) and diastolic (congestive) heart failure: Secondary | ICD-10-CM | POA: Diagnosis not present

## 2018-05-04 DIAGNOSIS — D631 Anemia in chronic kidney disease: Secondary | ICD-10-CM | POA: Diagnosis not present

## 2018-05-04 DIAGNOSIS — D508 Other iron deficiency anemias: Secondary | ICD-10-CM | POA: Diagnosis not present

## 2018-05-04 DIAGNOSIS — I4891 Unspecified atrial fibrillation: Secondary | ICD-10-CM | POA: Diagnosis not present

## 2018-05-04 DIAGNOSIS — Z992 Dependence on renal dialysis: Secondary | ICD-10-CM | POA: Diagnosis not present

## 2018-05-04 DIAGNOSIS — N2581 Secondary hyperparathyroidism of renal origin: Secondary | ICD-10-CM | POA: Diagnosis not present

## 2018-05-04 DIAGNOSIS — N186 End stage renal disease: Secondary | ICD-10-CM | POA: Diagnosis not present

## 2018-05-05 DIAGNOSIS — I4891 Unspecified atrial fibrillation: Secondary | ICD-10-CM | POA: Diagnosis not present

## 2018-05-05 DIAGNOSIS — D508 Other iron deficiency anemias: Secondary | ICD-10-CM | POA: Diagnosis not present

## 2018-05-05 DIAGNOSIS — Z992 Dependence on renal dialysis: Secondary | ICD-10-CM | POA: Diagnosis not present

## 2018-05-05 DIAGNOSIS — N2581 Secondary hyperparathyroidism of renal origin: Secondary | ICD-10-CM | POA: Diagnosis not present

## 2018-05-05 DIAGNOSIS — N186 End stage renal disease: Secondary | ICD-10-CM | POA: Diagnosis not present

## 2018-05-05 DIAGNOSIS — D631 Anemia in chronic kidney disease: Secondary | ICD-10-CM | POA: Diagnosis not present

## 2018-05-07 DIAGNOSIS — D631 Anemia in chronic kidney disease: Secondary | ICD-10-CM | POA: Diagnosis not present

## 2018-05-07 DIAGNOSIS — Z992 Dependence on renal dialysis: Secondary | ICD-10-CM | POA: Diagnosis not present

## 2018-05-07 DIAGNOSIS — N2581 Secondary hyperparathyroidism of renal origin: Secondary | ICD-10-CM | POA: Diagnosis not present

## 2018-05-07 DIAGNOSIS — N186 End stage renal disease: Secondary | ICD-10-CM | POA: Diagnosis not present

## 2018-05-07 DIAGNOSIS — I4891 Unspecified atrial fibrillation: Secondary | ICD-10-CM | POA: Diagnosis not present

## 2018-05-07 DIAGNOSIS — D508 Other iron deficiency anemias: Secondary | ICD-10-CM | POA: Diagnosis not present

## 2018-05-08 DIAGNOSIS — E1122 Type 2 diabetes mellitus with diabetic chronic kidney disease: Secondary | ICD-10-CM | POA: Diagnosis not present

## 2018-05-08 DIAGNOSIS — M069 Rheumatoid arthritis, unspecified: Secondary | ICD-10-CM | POA: Diagnosis not present

## 2018-05-08 DIAGNOSIS — N186 End stage renal disease: Secondary | ICD-10-CM | POA: Diagnosis not present

## 2018-05-08 DIAGNOSIS — I5042 Chronic combined systolic (congestive) and diastolic (congestive) heart failure: Secondary | ICD-10-CM | POA: Diagnosis not present

## 2018-05-08 DIAGNOSIS — I132 Hypertensive heart and chronic kidney disease with heart failure and with stage 5 chronic kidney disease, or end stage renal disease: Secondary | ICD-10-CM | POA: Diagnosis not present

## 2018-05-08 DIAGNOSIS — Z471 Aftercare following joint replacement surgery: Secondary | ICD-10-CM | POA: Diagnosis not present

## 2018-05-09 DIAGNOSIS — N186 End stage renal disease: Secondary | ICD-10-CM | POA: Diagnosis not present

## 2018-05-09 DIAGNOSIS — D631 Anemia in chronic kidney disease: Secondary | ICD-10-CM | POA: Diagnosis not present

## 2018-05-09 DIAGNOSIS — N2581 Secondary hyperparathyroidism of renal origin: Secondary | ICD-10-CM | POA: Diagnosis not present

## 2018-05-09 DIAGNOSIS — Z992 Dependence on renal dialysis: Secondary | ICD-10-CM | POA: Diagnosis not present

## 2018-05-09 DIAGNOSIS — D508 Other iron deficiency anemias: Secondary | ICD-10-CM | POA: Diagnosis not present

## 2018-05-09 DIAGNOSIS — I4891 Unspecified atrial fibrillation: Secondary | ICD-10-CM | POA: Diagnosis not present

## 2018-05-11 DIAGNOSIS — I4891 Unspecified atrial fibrillation: Secondary | ICD-10-CM | POA: Diagnosis not present

## 2018-05-11 DIAGNOSIS — N186 End stage renal disease: Secondary | ICD-10-CM | POA: Diagnosis not present

## 2018-05-11 DIAGNOSIS — N2581 Secondary hyperparathyroidism of renal origin: Secondary | ICD-10-CM | POA: Diagnosis not present

## 2018-05-11 DIAGNOSIS — Z992 Dependence on renal dialysis: Secondary | ICD-10-CM | POA: Diagnosis not present

## 2018-05-11 DIAGNOSIS — D631 Anemia in chronic kidney disease: Secondary | ICD-10-CM | POA: Diagnosis not present

## 2018-05-11 DIAGNOSIS — D508 Other iron deficiency anemias: Secondary | ICD-10-CM | POA: Diagnosis not present

## 2018-05-11 LAB — POCT INR: INR: 2.6 — AB (ref 0.9–1.1)

## 2018-05-12 DIAGNOSIS — D508 Other iron deficiency anemias: Secondary | ICD-10-CM | POA: Diagnosis not present

## 2018-05-12 DIAGNOSIS — Z992 Dependence on renal dialysis: Secondary | ICD-10-CM | POA: Diagnosis not present

## 2018-05-12 DIAGNOSIS — I4891 Unspecified atrial fibrillation: Secondary | ICD-10-CM | POA: Diagnosis not present

## 2018-05-12 DIAGNOSIS — D631 Anemia in chronic kidney disease: Secondary | ICD-10-CM | POA: Diagnosis not present

## 2018-05-12 DIAGNOSIS — N2581 Secondary hyperparathyroidism of renal origin: Secondary | ICD-10-CM | POA: Diagnosis not present

## 2018-05-12 DIAGNOSIS — N186 End stage renal disease: Secondary | ICD-10-CM | POA: Diagnosis not present

## 2018-05-14 DIAGNOSIS — D631 Anemia in chronic kidney disease: Secondary | ICD-10-CM | POA: Diagnosis not present

## 2018-05-14 DIAGNOSIS — D508 Other iron deficiency anemias: Secondary | ICD-10-CM | POA: Diagnosis not present

## 2018-05-14 DIAGNOSIS — N2581 Secondary hyperparathyroidism of renal origin: Secondary | ICD-10-CM | POA: Diagnosis not present

## 2018-05-14 DIAGNOSIS — I4891 Unspecified atrial fibrillation: Secondary | ICD-10-CM | POA: Diagnosis not present

## 2018-05-14 DIAGNOSIS — N186 End stage renal disease: Secondary | ICD-10-CM | POA: Diagnosis not present

## 2018-05-14 DIAGNOSIS — Z992 Dependence on renal dialysis: Secondary | ICD-10-CM | POA: Diagnosis not present

## 2018-05-15 ENCOUNTER — Telehealth: Payer: Self-pay | Admitting: *Deleted

## 2018-05-15 DIAGNOSIS — E1122 Type 2 diabetes mellitus with diabetic chronic kidney disease: Secondary | ICD-10-CM | POA: Diagnosis not present

## 2018-05-15 DIAGNOSIS — Z471 Aftercare following joint replacement surgery: Secondary | ICD-10-CM | POA: Diagnosis not present

## 2018-05-15 DIAGNOSIS — I5042 Chronic combined systolic (congestive) and diastolic (congestive) heart failure: Secondary | ICD-10-CM | POA: Diagnosis not present

## 2018-05-15 DIAGNOSIS — M069 Rheumatoid arthritis, unspecified: Secondary | ICD-10-CM | POA: Diagnosis not present

## 2018-05-15 DIAGNOSIS — N186 End stage renal disease: Secondary | ICD-10-CM | POA: Diagnosis not present

## 2018-05-15 DIAGNOSIS — I132 Hypertensive heart and chronic kidney disease with heart failure and with stage 5 chronic kidney disease, or end stage renal disease: Secondary | ICD-10-CM | POA: Diagnosis not present

## 2018-05-15 NOTE — Telephone Encounter (Signed)
Received fax from the Dialysis Center with date of 05/11/18 for INR result of 2.60. Per 04/27/18 phone note, pt was instructed to take Coumadin 7.5mg  daily and repeat in 2 weeks.  Please advise?

## 2018-05-15 NOTE — Telephone Encounter (Signed)
Agree. Continue current dose and repeat in 2 weeks.

## 2018-05-15 NOTE — Telephone Encounter (Signed)
Order faxed to dialysis center and pt has been notified and voices understanding.

## 2018-05-16 DIAGNOSIS — D508 Other iron deficiency anemias: Secondary | ICD-10-CM | POA: Diagnosis not present

## 2018-05-16 DIAGNOSIS — Z992 Dependence on renal dialysis: Secondary | ICD-10-CM | POA: Diagnosis not present

## 2018-05-16 DIAGNOSIS — D631 Anemia in chronic kidney disease: Secondary | ICD-10-CM | POA: Diagnosis not present

## 2018-05-16 DIAGNOSIS — N2581 Secondary hyperparathyroidism of renal origin: Secondary | ICD-10-CM | POA: Diagnosis not present

## 2018-05-16 DIAGNOSIS — N186 End stage renal disease: Secondary | ICD-10-CM | POA: Diagnosis not present

## 2018-05-16 DIAGNOSIS — I4891 Unspecified atrial fibrillation: Secondary | ICD-10-CM | POA: Diagnosis not present

## 2018-05-17 ENCOUNTER — Telehealth: Payer: Self-pay | Admitting: *Deleted

## 2018-05-17 DIAGNOSIS — I132 Hypertensive heart and chronic kidney disease with heart failure and with stage 5 chronic kidney disease, or end stage renal disease: Secondary | ICD-10-CM | POA: Diagnosis not present

## 2018-05-17 DIAGNOSIS — M069 Rheumatoid arthritis, unspecified: Secondary | ICD-10-CM | POA: Diagnosis not present

## 2018-05-17 DIAGNOSIS — M0569 Rheumatoid arthritis of multiple sites with involvement of other organs and systems: Secondary | ICD-10-CM | POA: Diagnosis not present

## 2018-05-17 DIAGNOSIS — E1122 Type 2 diabetes mellitus with diabetic chronic kidney disease: Secondary | ICD-10-CM | POA: Diagnosis not present

## 2018-05-17 DIAGNOSIS — N186 End stage renal disease: Secondary | ICD-10-CM | POA: Diagnosis not present

## 2018-05-17 DIAGNOSIS — I5042 Chronic combined systolic (congestive) and diastolic (congestive) heart failure: Secondary | ICD-10-CM | POA: Diagnosis not present

## 2018-05-17 DIAGNOSIS — Z471 Aftercare following joint replacement surgery: Secondary | ICD-10-CM | POA: Diagnosis not present

## 2018-05-17 NOTE — Telephone Encounter (Signed)
[facility]; forwarded to provider/SLS 10/31

## 2018-05-18 ENCOUNTER — Telehealth: Payer: Self-pay | Admitting: Family

## 2018-05-18 DIAGNOSIS — E8779 Other fluid overload: Secondary | ICD-10-CM | POA: Diagnosis not present

## 2018-05-18 DIAGNOSIS — Z992 Dependence on renal dialysis: Secondary | ICD-10-CM | POA: Diagnosis not present

## 2018-05-18 DIAGNOSIS — I132 Hypertensive heart and chronic kidney disease with heart failure and with stage 5 chronic kidney disease, or end stage renal disease: Secondary | ICD-10-CM | POA: Diagnosis not present

## 2018-05-18 DIAGNOSIS — N2581 Secondary hyperparathyroidism of renal origin: Secondary | ICD-10-CM | POA: Diagnosis not present

## 2018-05-18 DIAGNOSIS — N186 End stage renal disease: Secondary | ICD-10-CM | POA: Diagnosis not present

## 2018-05-18 DIAGNOSIS — I5042 Chronic combined systolic (congestive) and diastolic (congestive) heart failure: Secondary | ICD-10-CM | POA: Diagnosis not present

## 2018-05-18 DIAGNOSIS — I158 Other secondary hypertension: Secondary | ICD-10-CM | POA: Diagnosis not present

## 2018-05-18 DIAGNOSIS — I4891 Unspecified atrial fibrillation: Secondary | ICD-10-CM | POA: Diagnosis not present

## 2018-05-18 DIAGNOSIS — D631 Anemia in chronic kidney disease: Secondary | ICD-10-CM | POA: Diagnosis not present

## 2018-05-18 NOTE — Telephone Encounter (Signed)
Verbal given to Charisse to proceed with below social work evaluation.

## 2018-05-18 NOTE — Telephone Encounter (Signed)
Copied from [place] [phone]. Topic: Quick Communication - See Telephone Encounter >> May 18, 2018  9:05 AM Ahmed Prima L wrote: CRM for notification. See Telephone encounter for: 05/18/18.  Charisse, physical therapist with [facility] home health called to get orders for a Education officer, museum for evaluation for community resources.  Call back @ [phone]

## 2018-05-19 DIAGNOSIS — N186 End stage renal disease: Secondary | ICD-10-CM | POA: Diagnosis not present

## 2018-05-19 DIAGNOSIS — Z992 Dependence on renal dialysis: Secondary | ICD-10-CM | POA: Diagnosis not present

## 2018-05-19 DIAGNOSIS — I4891 Unspecified atrial fibrillation: Secondary | ICD-10-CM | POA: Diagnosis not present

## 2018-05-19 DIAGNOSIS — N2581 Secondary hyperparathyroidism of renal origin: Secondary | ICD-10-CM | POA: Diagnosis not present

## 2018-05-19 DIAGNOSIS — I5042 Chronic combined systolic (congestive) and diastolic (congestive) heart failure: Secondary | ICD-10-CM | POA: Diagnosis not present

## 2018-05-19 DIAGNOSIS — D631 Anemia in chronic kidney disease: Secondary | ICD-10-CM | POA: Diagnosis not present

## 2018-05-21 DIAGNOSIS — I4891 Unspecified atrial fibrillation: Secondary | ICD-10-CM | POA: Diagnosis not present

## 2018-05-21 DIAGNOSIS — D631 Anemia in chronic kidney disease: Secondary | ICD-10-CM | POA: Diagnosis not present

## 2018-05-21 DIAGNOSIS — I5042 Chronic combined systolic (congestive) and diastolic (congestive) heart failure: Secondary | ICD-10-CM | POA: Diagnosis not present

## 2018-05-21 DIAGNOSIS — Z992 Dependence on renal dialysis: Secondary | ICD-10-CM | POA: Diagnosis not present

## 2018-05-21 DIAGNOSIS — N186 End stage renal disease: Secondary | ICD-10-CM | POA: Diagnosis not present

## 2018-05-21 DIAGNOSIS — N2581 Secondary hyperparathyroidism of renal origin: Secondary | ICD-10-CM | POA: Diagnosis not present

## 2018-05-22 ENCOUNTER — Telehealth: Payer: Self-pay | Admitting: Family

## 2018-05-22 DIAGNOSIS — N186 End stage renal disease: Secondary | ICD-10-CM | POA: Diagnosis not present

## 2018-05-22 DIAGNOSIS — Z471 Aftercare following joint replacement surgery: Secondary | ICD-10-CM | POA: Diagnosis not present

## 2018-05-22 DIAGNOSIS — E1122 Type 2 diabetes mellitus with diabetic chronic kidney disease: Secondary | ICD-10-CM | POA: Diagnosis not present

## 2018-05-22 DIAGNOSIS — M069 Rheumatoid arthritis, unspecified: Secondary | ICD-10-CM | POA: Diagnosis not present

## 2018-05-22 DIAGNOSIS — I132 Hypertensive heart and chronic kidney disease with heart failure and with stage 5 chronic kidney disease, or end stage renal disease: Secondary | ICD-10-CM | POA: Diagnosis not present

## 2018-05-22 DIAGNOSIS — I5042 Chronic combined systolic (congestive) and diastolic (congestive) heart failure: Secondary | ICD-10-CM | POA: Diagnosis not present

## 2018-05-22 NOTE — Telephone Encounter (Signed)
Spoke with Chaeerie at below # and she states she only needed orders to extend PT.  Verbal authorization given.

## 2018-05-22 NOTE — Telephone Encounter (Signed)
Copied from [place] [id number]. Topic: Quick Communication - Home Health Verbal Orders >> May 22, 2018 10:18 AM Percell Belt A wrote: Caller/Agency: Chaeerie with [facility] Number: [phone] Requesting OT/PT/Skilled Nursing/Social Work: to extend PT  Frequency:  1 week 4

## 2018-05-23 DIAGNOSIS — I5042 Chronic combined systolic (congestive) and diastolic (congestive) heart failure: Secondary | ICD-10-CM | POA: Diagnosis not present

## 2018-05-23 DIAGNOSIS — N2581 Secondary hyperparathyroidism of renal origin: Secondary | ICD-10-CM | POA: Diagnosis not present

## 2018-05-23 DIAGNOSIS — I4891 Unspecified atrial fibrillation: Secondary | ICD-10-CM | POA: Diagnosis not present

## 2018-05-23 DIAGNOSIS — D631 Anemia in chronic kidney disease: Secondary | ICD-10-CM | POA: Diagnosis not present

## 2018-05-23 DIAGNOSIS — Z992 Dependence on renal dialysis: Secondary | ICD-10-CM | POA: Diagnosis not present

## 2018-05-23 DIAGNOSIS — N186 End stage renal disease: Secondary | ICD-10-CM | POA: Diagnosis not present

## 2018-05-23 NOTE — Telephone Encounter (Signed)
Noted and agree. 

## 2018-05-24 DIAGNOSIS — I5042 Chronic combined systolic (congestive) and diastolic (congestive) heart failure: Secondary | ICD-10-CM | POA: Diagnosis not present

## 2018-05-24 DIAGNOSIS — Z471 Aftercare following joint replacement surgery: Secondary | ICD-10-CM | POA: Diagnosis not present

## 2018-05-24 DIAGNOSIS — E1122 Type 2 diabetes mellitus with diabetic chronic kidney disease: Secondary | ICD-10-CM | POA: Diagnosis not present

## 2018-05-24 DIAGNOSIS — M069 Rheumatoid arthritis, unspecified: Secondary | ICD-10-CM | POA: Diagnosis not present

## 2018-05-24 DIAGNOSIS — N186 End stage renal disease: Secondary | ICD-10-CM | POA: Diagnosis not present

## 2018-05-24 DIAGNOSIS — I132 Hypertensive heart and chronic kidney disease with heart failure and with stage 5 chronic kidney disease, or end stage renal disease: Secondary | ICD-10-CM | POA: Diagnosis not present

## 2018-05-25 DIAGNOSIS — N2581 Secondary hyperparathyroidism of renal origin: Secondary | ICD-10-CM | POA: Diagnosis not present

## 2018-05-25 DIAGNOSIS — D631 Anemia in chronic kidney disease: Secondary | ICD-10-CM | POA: Diagnosis not present

## 2018-05-25 DIAGNOSIS — I4891 Unspecified atrial fibrillation: Secondary | ICD-10-CM | POA: Diagnosis not present

## 2018-05-25 DIAGNOSIS — Z992 Dependence on renal dialysis: Secondary | ICD-10-CM | POA: Diagnosis not present

## 2018-05-25 DIAGNOSIS — N186 End stage renal disease: Secondary | ICD-10-CM | POA: Diagnosis not present

## 2018-05-25 DIAGNOSIS — I5042 Chronic combined systolic (congestive) and diastolic (congestive) heart failure: Secondary | ICD-10-CM | POA: Diagnosis not present

## 2018-05-26 DIAGNOSIS — Z992 Dependence on renal dialysis: Secondary | ICD-10-CM | POA: Diagnosis not present

## 2018-05-26 DIAGNOSIS — D631 Anemia in chronic kidney disease: Secondary | ICD-10-CM | POA: Diagnosis not present

## 2018-05-26 DIAGNOSIS — M069 Rheumatoid arthritis, unspecified: Secondary | ICD-10-CM | POA: Diagnosis not present

## 2018-05-26 DIAGNOSIS — I132 Hypertensive heart and chronic kidney disease with heart failure and with stage 5 chronic kidney disease, or end stage renal disease: Secondary | ICD-10-CM | POA: Diagnosis not present

## 2018-05-26 DIAGNOSIS — Z471 Aftercare following joint replacement surgery: Secondary | ICD-10-CM | POA: Diagnosis not present

## 2018-05-26 DIAGNOSIS — I5042 Chronic combined systolic (congestive) and diastolic (congestive) heart failure: Secondary | ICD-10-CM | POA: Diagnosis not present

## 2018-05-26 DIAGNOSIS — E1122 Type 2 diabetes mellitus with diabetic chronic kidney disease: Secondary | ICD-10-CM | POA: Diagnosis not present

## 2018-05-26 DIAGNOSIS — N2581 Secondary hyperparathyroidism of renal origin: Secondary | ICD-10-CM | POA: Diagnosis not present

## 2018-05-26 DIAGNOSIS — N186 End stage renal disease: Secondary | ICD-10-CM | POA: Diagnosis not present

## 2018-05-26 DIAGNOSIS — I4891 Unspecified atrial fibrillation: Secondary | ICD-10-CM | POA: Diagnosis not present

## 2018-05-28 ENCOUNTER — Telehealth: Payer: Self-pay | Admitting: *Deleted

## 2018-05-28 DIAGNOSIS — Z992 Dependence on renal dialysis: Secondary | ICD-10-CM | POA: Diagnosis not present

## 2018-05-28 DIAGNOSIS — I5042 Chronic combined systolic (congestive) and diastolic (congestive) heart failure: Secondary | ICD-10-CM | POA: Diagnosis not present

## 2018-05-28 DIAGNOSIS — I4891 Unspecified atrial fibrillation: Secondary | ICD-10-CM | POA: Diagnosis not present

## 2018-05-28 DIAGNOSIS — N2581 Secondary hyperparathyroidism of renal origin: Secondary | ICD-10-CM | POA: Diagnosis not present

## 2018-05-28 DIAGNOSIS — D631 Anemia in chronic kidney disease: Secondary | ICD-10-CM | POA: Diagnosis not present

## 2018-05-28 DIAGNOSIS — N186 End stage renal disease: Secondary | ICD-10-CM | POA: Diagnosis not present

## 2018-05-28 NOTE — Telephone Encounter (Signed)
Received Physician Orders from [facility]; forwarded to provider/SLS 11/11

## 2018-05-29 DIAGNOSIS — Z471 Aftercare following joint replacement surgery: Secondary | ICD-10-CM | POA: Diagnosis not present

## 2018-05-29 DIAGNOSIS — E1122 Type 2 diabetes mellitus with diabetic chronic kidney disease: Secondary | ICD-10-CM | POA: Diagnosis not present

## 2018-05-29 DIAGNOSIS — M069 Rheumatoid arthritis, unspecified: Secondary | ICD-10-CM | POA: Diagnosis not present

## 2018-05-29 DIAGNOSIS — N186 End stage renal disease: Secondary | ICD-10-CM | POA: Diagnosis not present

## 2018-05-29 DIAGNOSIS — I5042 Chronic combined systolic (congestive) and diastolic (congestive) heart failure: Secondary | ICD-10-CM | POA: Diagnosis not present

## 2018-05-29 DIAGNOSIS — I132 Hypertensive heart and chronic kidney disease with heart failure and with stage 5 chronic kidney disease, or end stage renal disease: Secondary | ICD-10-CM | POA: Diagnosis not present

## 2018-05-30 ENCOUNTER — Other Ambulatory Visit: Payer: Self-pay | Admitting: Family

## 2018-05-30 DIAGNOSIS — D631 Anemia in chronic kidney disease: Secondary | ICD-10-CM | POA: Diagnosis not present

## 2018-05-30 DIAGNOSIS — N186 End stage renal disease: Secondary | ICD-10-CM | POA: Diagnosis not present

## 2018-05-30 DIAGNOSIS — Z992 Dependence on renal dialysis: Secondary | ICD-10-CM | POA: Diagnosis not present

## 2018-05-30 DIAGNOSIS — N2581 Secondary hyperparathyroidism of renal origin: Secondary | ICD-10-CM | POA: Diagnosis not present

## 2018-05-30 DIAGNOSIS — I4891 Unspecified atrial fibrillation: Secondary | ICD-10-CM | POA: Diagnosis not present

## 2018-05-30 DIAGNOSIS — I5042 Chronic combined systolic (congestive) and diastolic (congestive) heart failure: Secondary | ICD-10-CM | POA: Diagnosis not present

## 2018-06-01 ENCOUNTER — Telehealth: Payer: Self-pay | Admitting: *Deleted

## 2018-06-01 DIAGNOSIS — N2581 Secondary hyperparathyroidism of renal origin: Secondary | ICD-10-CM | POA: Diagnosis not present

## 2018-06-01 DIAGNOSIS — I4891 Unspecified atrial fibrillation: Secondary | ICD-10-CM | POA: Diagnosis not present

## 2018-06-01 DIAGNOSIS — N186 End stage renal disease: Secondary | ICD-10-CM | POA: Diagnosis not present

## 2018-06-01 DIAGNOSIS — D631 Anemia in chronic kidney disease: Secondary | ICD-10-CM | POA: Diagnosis not present

## 2018-06-01 DIAGNOSIS — I5042 Chronic combined systolic (congestive) and diastolic (congestive) heart failure: Secondary | ICD-10-CM | POA: Diagnosis not present

## 2018-06-01 DIAGNOSIS — Z992 Dependence on renal dialysis: Secondary | ICD-10-CM | POA: Diagnosis not present

## 2018-06-01 NOTE — Telephone Encounter (Signed)
Received Physician Orders from [facility]; forwarded to provider/SLS 11/15

## 2018-06-02 DIAGNOSIS — N2581 Secondary hyperparathyroidism of renal origin: Secondary | ICD-10-CM | POA: Diagnosis not present

## 2018-06-02 DIAGNOSIS — N186 End stage renal disease: Secondary | ICD-10-CM | POA: Diagnosis not present

## 2018-06-02 DIAGNOSIS — Z992 Dependence on renal dialysis: Secondary | ICD-10-CM | POA: Diagnosis not present

## 2018-06-02 DIAGNOSIS — I4891 Unspecified atrial fibrillation: Secondary | ICD-10-CM | POA: Diagnosis not present

## 2018-06-02 DIAGNOSIS — I5042 Chronic combined systolic (congestive) and diastolic (congestive) heart failure: Secondary | ICD-10-CM | POA: Diagnosis not present

## 2018-06-02 DIAGNOSIS — D631 Anemia in chronic kidney disease: Secondary | ICD-10-CM | POA: Diagnosis not present

## 2018-06-04 DIAGNOSIS — I4891 Unspecified atrial fibrillation: Secondary | ICD-10-CM | POA: Diagnosis not present

## 2018-06-04 DIAGNOSIS — Z992 Dependence on renal dialysis: Secondary | ICD-10-CM | POA: Diagnosis not present

## 2018-06-04 DIAGNOSIS — N186 End stage renal disease: Secondary | ICD-10-CM | POA: Diagnosis not present

## 2018-06-04 DIAGNOSIS — I5042 Chronic combined systolic (congestive) and diastolic (congestive) heart failure: Secondary | ICD-10-CM | POA: Diagnosis not present

## 2018-06-04 DIAGNOSIS — D631 Anemia in chronic kidney disease: Secondary | ICD-10-CM | POA: Diagnosis not present

## 2018-06-04 DIAGNOSIS — N2581 Secondary hyperparathyroidism of renal origin: Secondary | ICD-10-CM | POA: Diagnosis not present

## 2018-06-05 DIAGNOSIS — E1122 Type 2 diabetes mellitus with diabetic chronic kidney disease: Secondary | ICD-10-CM | POA: Diagnosis not present

## 2018-06-05 DIAGNOSIS — I132 Hypertensive heart and chronic kidney disease with heart failure and with stage 5 chronic kidney disease, or end stage renal disease: Secondary | ICD-10-CM | POA: Diagnosis not present

## 2018-06-05 DIAGNOSIS — I5042 Chronic combined systolic (congestive) and diastolic (congestive) heart failure: Secondary | ICD-10-CM | POA: Diagnosis not present

## 2018-06-05 DIAGNOSIS — M069 Rheumatoid arthritis, unspecified: Secondary | ICD-10-CM | POA: Diagnosis not present

## 2018-06-05 DIAGNOSIS — N186 End stage renal disease: Secondary | ICD-10-CM | POA: Diagnosis not present

## 2018-06-05 DIAGNOSIS — Z471 Aftercare following joint replacement surgery: Secondary | ICD-10-CM | POA: Diagnosis not present

## 2018-06-06 DIAGNOSIS — N186 End stage renal disease: Secondary | ICD-10-CM | POA: Diagnosis not present

## 2018-06-06 DIAGNOSIS — I4891 Unspecified atrial fibrillation: Secondary | ICD-10-CM | POA: Diagnosis not present

## 2018-06-06 DIAGNOSIS — D631 Anemia in chronic kidney disease: Secondary | ICD-10-CM | POA: Diagnosis not present

## 2018-06-06 DIAGNOSIS — I5042 Chronic combined systolic (congestive) and diastolic (congestive) heart failure: Secondary | ICD-10-CM | POA: Diagnosis not present

## 2018-06-06 DIAGNOSIS — N2581 Secondary hyperparathyroidism of renal origin: Secondary | ICD-10-CM | POA: Diagnosis not present

## 2018-06-06 DIAGNOSIS — Z992 Dependence on renal dialysis: Secondary | ICD-10-CM | POA: Diagnosis not present

## 2018-06-08 ENCOUNTER — Encounter: Payer: Self-pay | Admitting: Family

## 2018-06-08 ENCOUNTER — Ambulatory Visit (INDEPENDENT_AMBULATORY_CARE_PROVIDER_SITE_OTHER): Payer: Medicare Other | Admitting: Family

## 2018-06-08 VITALS — BP 103/50 | HR 55 | Temp 98.0°F | Ht 75.0 in | Wt 281.8 lb

## 2018-06-08 DIAGNOSIS — B351 Tinea unguium: Secondary | ICD-10-CM | POA: Diagnosis not present

## 2018-06-08 DIAGNOSIS — I5042 Chronic combined systolic (congestive) and diastolic (congestive) heart failure: Secondary | ICD-10-CM | POA: Diagnosis not present

## 2018-06-08 DIAGNOSIS — N186 End stage renal disease: Secondary | ICD-10-CM | POA: Diagnosis not present

## 2018-06-08 DIAGNOSIS — B029 Zoster without complications: Secondary | ICD-10-CM | POA: Diagnosis not present

## 2018-06-08 DIAGNOSIS — I4891 Unspecified atrial fibrillation: Secondary | ICD-10-CM | POA: Diagnosis not present

## 2018-06-08 DIAGNOSIS — L989 Disorder of the skin and subcutaneous tissue, unspecified: Secondary | ICD-10-CM

## 2018-06-08 DIAGNOSIS — N2581 Secondary hyperparathyroidism of renal origin: Secondary | ICD-10-CM | POA: Diagnosis not present

## 2018-06-08 DIAGNOSIS — Z992 Dependence on renal dialysis: Secondary | ICD-10-CM | POA: Diagnosis not present

## 2018-06-08 DIAGNOSIS — D631 Anemia in chronic kidney disease: Secondary | ICD-10-CM | POA: Diagnosis not present

## 2018-06-08 NOTE — Patient Instructions (Signed)
You can apply capsaicin cream as needed to healing shingles area. If this does not help you can try salon pas patches.  Both of these are available OTC.

## 2018-06-08 NOTE — Progress Notes (Signed)
Subjective:    Patient ID: Bruce Cole, male    DOB: 27-Aug-1940, 77 y.o.   MRN: [id number]  HPI  Patient is a 77 yr old male who presents today with chief complaint of rash. Reports that he first noticed 4 weeks ago.  Reports rash is located on the right buttock.  Rash is painful.  He is using over-the-counter calamine lotion which is "the only thing that helps."  He reports that he is doing physical therapy and that it is really helping him.  Review of Systems See HPI  Past Medical History:  Diagnosis Date  . Acute blood loss anemia   . Atrial fibrillation (HCC)    Chronic  . Chronic combined systolic and diastolic heart failure ([place])   . Colon polyp 2000  . Dysrhythmia    hx  . ESRD on hemodialysis ([place])    [place]  . Essential hypertension   . Gastritis and gastroduodenitis   . GI bleed   . Gout   . Heart murmur   . History of blood transfusion   . History of kidney stones   . Nephrolithiasis   . OSA (obstructive sleep apnea) 09/02/2013    IMPRESSION :  1. Mild obstructive sleep apnea with hypopneas causing sleep fragmentation and moderate oxygen desaturation.  2. Short runs of nonsustained VT were noted. His beta blocker may need to be titrated 3. Significant PLM's were noted, the PLM arousal index was low. Please correlate with clinical history of restless leg syndrome.  4. Sleep efficiency was poor.  RECOMMENDATION:  1. Treatment options for this degree of sleep disordered breathing include weight loss and positional therapy to avoid supine sleep 2. Consider titrating beta blocker further, defer to cardiologist 3. Patient should be cautioned against driving when sleepy.They should be asked to avoid medications with sedative side effects     . Osteoarthritis of right hip   . Pneumonia    hx 30 yrs ago  . Primary osteoarthritis of right hip   . Rheumatoid arthritis ([place])   . Thrombocytopenia ([place])      Social History   Socioeconomic History    . Marital status: Single    Spouse name: Not on file  . Number of children: 3  . Years of education: Not on file  . Highest education level: Not on file  Occupational History  . Occupation: retired Personnel officer: RETIRED  Social Needs  . Financial resource strain: Not on file  . Food insecurity:    Worry: Not on file    Inability: Not on file  . Transportation needs:    Medical: Not on file    Non-medical: Not on file  Tobacco Use  . Smoking status: Never Smoker  . Smokeless tobacco: Never Used  Substance and Sexual Activity  . Alcohol use: No    Comment: occasional  . Drug use: No  . Sexual activity: Not on file  Lifestyle  . Physical activity:    Days per week: Not on file    Minutes per session: Not on file  . Stress: Not on file  Relationships  . Social connections:    Talks on phone: Not on file    Gets together: Not on file    Attends religious service: Not on file    Active member of club or organization: Not on file    Attends meetings of clubs or organizations: Not on file    Relationship  status: Not on file  . Intimate partner violence:    Fear of current or ex partner: Not on file    Emotionally abused: Not on file    Physically abused: Not on file    Forced sexual activity: Not on file  Other Topics Concern  . Not on file  Social History Narrative   Former [employer] [place]   Admitted to [place] 11/25/15   Widowed   Never smoked   FULL CODE    Past Surgical History:  Procedure Laterality Date  . AV FISTULA PLACEMENT Left 08/26/2015   Procedure: LEFT RADIOCEPHALIC FISTULA CREATION;  Surgeon: Rosetta Posner, MD;  Location: [place];  Service: Vascular;  Laterality: Left;  . AV FISTULA PLACEMENT Left 11/23/2015   Procedure: ARTERIOVENOUS (AV) FISTULA CREATION;  Surgeon: Rosetta Posner, MD;  Location: [place];  Service: Vascular;  Laterality: Left;  . BACK SURGERY     x2- discectomy  . CHOLECYSTECTOMY  1994  .  CYSTOSCOPY/RETROGRADE/URETEROSCOPY/STONE EXTRACTION WITH BASKET    . ESOPHAGOGASTRODUODENOSCOPY N/A 11/13/2015   Procedure: ESOPHAGOGASTRODUODENOSCOPY (EGD);  Surgeon: Irene Shipper, MD;  Location: [facility] ENDOSCOPY;  Service: Endoscopy;  Laterality: N/A;  . INSERTION OF DIALYSIS CATHETER Left 11/23/2015   Procedure: INSERTION OF DIALYSIS CATHETER;  Surgeon: Rosetta Posner, MD;  Location: [place];  Service: Vascular;  Laterality: Left;  . IR GENERIC HISTORICAL Left 08/04/2016   IR DIALY SHUNT INTRO NEEDLE/INTRACATH INITIAL W/IMG LEFT 08/04/2016 Markus Daft, MD [facility]  . IR GENERIC HISTORICAL  08/04/2016   IR US GUIDE VASC ACCESS LEFT 08/04/2016 Markus Daft, MD [facility]  . JOINT REPLACEMENT     Total L-Hip replacement, Right Knee 10/20/09  . LEFT AND RIGHT HEART CATHETERIZATION WITH CORONARY ANGIOGRAM N/A 02/22/2013   Procedure: LEFT AND RIGHT HEART CATHETERIZATION WITH CORONARY ANGIOGRAM;  Surgeon: Jolaine Artist, MD;  Location: [facility] CATH LAB;  Service: Cardiovascular;  Laterality: N/A;  . LITHOTRIPSY  90's  . PERIPHERAL VASCULAR CATHETERIZATION Left 11/19/2015   Procedure: A/V/Fistulagram;  Surgeon: Conrad Heeney, MD;  Location: [place] CV LAB;  Service: Cardiovascular;  Laterality: Left;  . PERIPHERAL VASCULAR CATHETERIZATION Left 07/21/2016   Procedure: A/V Fistulagram;  Surgeon: Conrad Sharon, MD;  Location: [place] CV LAB;  Service: Cardiovascular;  Laterality: Left;  arm  . REVISON OF ARTERIOVENOUS FISTULA Left 05/30/2016   Procedure: REVISION LEFT UPPER ARM FISTULA;  Surgeon: Conrad Blanchester, MD;  Location: [place];  Service: Vascular;  Laterality: Left;  . REVISON OF ARTERIOVENOUS FISTULA Left 07/22/2016   Procedure: REVISON OF BASILIC VEIN TRANSPOSITION ANASTOMOSIS;  Surgeon: Rosetta Posner, MD;  Location: [place];  Service: Vascular;  Laterality: Left;  . SPINE SURGERY     x 2  . TOTAL HIP ARTHROPLASTY Right 03/22/2016   Procedure: RIGHT TOTAL HIP ARTHROPLASTY ANTERIOR APPROACH;  Surgeon: Mcarthur Rossetti, MD;  Location: [place];  Service: Orthopedics;  Laterality: Right;    Family History  Problem Relation Age of Onset  . Hypertension Mother   . Arthritis Mother        ?RA  . Hypertension Father     Allergies  Allergen Reactions  . Ace Inhibitors Other (See Comments)    Worsening renal insufficiency    Current Outpatient Medications on File Prior to Visit  Medication Sig Dispense Refill  . acetaminophen (TYLENOL) 500 MG tablet Take 500 mg by mouth every 6 (six) hours as needed for mild pain.    [name]  albuterol (PROVENTIL) (2.5 MG/3ML) 0.083% nebulizer solution Take 2.5 mg by nebulization every 4 (four) hours as needed for shortness of breath. Reported on 09/25/2015    . B Complex-C-Folic Acid (RENA-VITE RX) 1 MG TABS Take 1 tablet by mouth at bedtime.   6  . colchicine 0.6 MG tablet Take 1/2 tablet by mouth once as needed for gout flare. Call if no improvement 15 tablet 0  . fluticasone (FLONASE) 50 MCG/ACT nasal spray INSTILL 2 SPRAYS INTO BOTH NOSTRILS EVERY DAY 16 g 5  . gabapentin (NEURONTIN) 300 MG capsule 1 tablet by mouth daily at bedtime 90 capsule 1  . RENVELA 800 MG tablet Take 3 tablets (2,400 mg total) by mouth 3 (three) times daily with meals. 90 tablet 0  . warfarin (COUMADIN) 7.5 MG tablet TAKE 1 TABLET BY MOUTH DAILY AS DIRECTED BY COUMADIN CLINIC 100 tablet 0  . atorvastatin (LIPITOR) 10 MG tablet TAKE 1 TABLET(10 MG) BY MOUTH DAILY (Patient not taking: Reported on 06/08/2018) 90 tablet 1  . furosemide (LASIX) 40 MG tablet TAKE 1 TABLET(40 MG) BY MOUTH TWICE DAILY (Patient not taking: Reported on 06/08/2018) 180 tablet 1  . hydroxychloroquine (PLAQUENIL) 200 MG tablet TAKE 1 TABLET(200 MG) BY MOUTH DAILY (Patient not taking: Reported on 06/08/2018) 90 tablet 1   No current facility-administered medications on file prior to visit.     BP (!) 103/50 (BP Location: Right Arm, Patient Position: Sitting, Cuff Size: Large)   Pulse (!) 55   Temp 98 F (36.7 C) (Oral)    Ht 6\' 3"  (1.905 m)   Wt 281 lb 12.8 oz (127.8 kg)   SpO2 100%   BMI 35.22 kg/m       Objective:   Physical Exam  Constitutional: He is oriented to person, place, and time. He appears well-developed and well-nourished. No distress.  HENT:  Head: Normocephalic and atraumatic.  Cardiovascular: Normal rate and regular rhythm.  No murmur heard. Pulmonary/Chest: Effort normal and breath sounds normal. No respiratory distress. He has no wheezes. He has no rales.  Musculoskeletal: He exhibits no edema.  Neurological: He is alert and oriented to person, place, and time.  Skin: Skin is warm and dry.     Tender scabbed area on right lateral buttock  Thickened, discolored fingernails left third and fourth fingers. Raised hypertrophic lesion on left upper forearm  Psychiatric: He has a normal mood and affect. His behavior is normal. Thought content normal.          Assessment & Plan:  Herpes zoster- history and presentation most consistent with resolving herpes zoster.  He is already on gabapentin.  I have advised a trial of capsaicin cream.  If this does not help he can try salon pas pads over-the-counter.  Will consider Shingrix vaccine next visit.  Onychomycosis-we will refer to dermatology for further evaluation.  Skin lesion-refer to dermatology for further evaluation.

## 2018-06-09 DIAGNOSIS — N186 End stage renal disease: Secondary | ICD-10-CM | POA: Diagnosis not present

## 2018-06-09 DIAGNOSIS — Z992 Dependence on renal dialysis: Secondary | ICD-10-CM | POA: Diagnosis not present

## 2018-06-09 DIAGNOSIS — I4891 Unspecified atrial fibrillation: Secondary | ICD-10-CM | POA: Diagnosis not present

## 2018-06-09 DIAGNOSIS — D631 Anemia in chronic kidney disease: Secondary | ICD-10-CM | POA: Diagnosis not present

## 2018-06-09 DIAGNOSIS — I5042 Chronic combined systolic (congestive) and diastolic (congestive) heart failure: Secondary | ICD-10-CM | POA: Diagnosis not present

## 2018-06-09 DIAGNOSIS — N2581 Secondary hyperparathyroidism of renal origin: Secondary | ICD-10-CM | POA: Diagnosis not present

## 2018-06-10 DIAGNOSIS — I5042 Chronic combined systolic (congestive) and diastolic (congestive) heart failure: Secondary | ICD-10-CM | POA: Diagnosis not present

## 2018-06-10 DIAGNOSIS — N2581 Secondary hyperparathyroidism of renal origin: Secondary | ICD-10-CM | POA: Diagnosis not present

## 2018-06-10 DIAGNOSIS — I4891 Unspecified atrial fibrillation: Secondary | ICD-10-CM | POA: Diagnosis not present

## 2018-06-10 DIAGNOSIS — Z992 Dependence on renal dialysis: Secondary | ICD-10-CM | POA: Diagnosis not present

## 2018-06-10 DIAGNOSIS — N186 End stage renal disease: Secondary | ICD-10-CM | POA: Diagnosis not present

## 2018-06-10 DIAGNOSIS — D631 Anemia in chronic kidney disease: Secondary | ICD-10-CM | POA: Diagnosis not present

## 2018-06-11 ENCOUNTER — Telehealth: Payer: Self-pay | Admitting: *Deleted

## 2018-06-11 NOTE — Telephone Encounter (Signed)
Lm for patient to call back for information about her INR ok for triage to release information.

## 2018-06-11 NOTE — Telephone Encounter (Signed)
Please continue current dose of coumadin and repeat INR in 1 month.

## 2018-06-11 NOTE — Addendum Note (Signed)
Addended by: Debbrah Alar on: 06/11/2018 12:35 PM   Modules accepted: Level of Service

## 2018-06-11 NOTE — Telephone Encounter (Signed)
Received fax dated 05/31/18 from the Dialysis Center. PT 25.1,   INR  2.36 on 05/31/18.  Pt is currently taking 7.5mg  once a day.  Please advise?

## 2018-06-11 NOTE — Telephone Encounter (Signed)
Received PT/INR results from [facility]; forwarded to provider/SLS 11/25

## 2018-06-11 NOTE — Telephone Encounter (Signed)
Patient advised of results and to continue same dose of coumadin. Will fax INR order to the dialysis center.

## 2018-06-12 ENCOUNTER — Encounter (INDEPENDENT_AMBULATORY_CARE_PROVIDER_SITE_OTHER): Payer: Self-pay | Admitting: Orthopaedic Surgery

## 2018-06-12 ENCOUNTER — Ambulatory Visit (INDEPENDENT_AMBULATORY_CARE_PROVIDER_SITE_OTHER): Payer: Medicare Other | Admitting: Orthopaedic Surgery

## 2018-06-12 ENCOUNTER — Ambulatory Visit (INDEPENDENT_AMBULATORY_CARE_PROVIDER_SITE_OTHER): Payer: Medicare Other

## 2018-06-12 DIAGNOSIS — I5042 Chronic combined systolic (congestive) and diastolic (congestive) heart failure: Secondary | ICD-10-CM | POA: Diagnosis not present

## 2018-06-12 DIAGNOSIS — M25551 Pain in right hip: Secondary | ICD-10-CM

## 2018-06-12 DIAGNOSIS — Z992 Dependence on renal dialysis: Secondary | ICD-10-CM | POA: Diagnosis not present

## 2018-06-12 DIAGNOSIS — N2581 Secondary hyperparathyroidism of renal origin: Secondary | ICD-10-CM | POA: Diagnosis not present

## 2018-06-12 DIAGNOSIS — D631 Anemia in chronic kidney disease: Secondary | ICD-10-CM | POA: Diagnosis not present

## 2018-06-12 DIAGNOSIS — N186 End stage renal disease: Secondary | ICD-10-CM | POA: Diagnosis not present

## 2018-06-12 DIAGNOSIS — Z96641 Presence of right artificial hip joint: Secondary | ICD-10-CM

## 2018-06-12 DIAGNOSIS — I4891 Unspecified atrial fibrillation: Secondary | ICD-10-CM | POA: Diagnosis not present

## 2018-06-12 NOTE — Progress Notes (Signed)
Office Visit Note   Patient: Bruce Cole           Date of Birth: Feb 26, 1941           MRN: [id number] Visit Date: 06/12/2018              Requested by: Debbrah Alar, NP [address] [place], [place] [address] PCP: Debbrah Alar, NP   Assessment & Plan: Visit Diagnoses:  1. Pain in right hip   2. Status post total replacement of right hip     Plan: From our standpoint, the hip replacements are in good position and show no complicating features at all.  He is not even describing pain around his hips.  This is more of an issue as it relates to his obesity and deconditioning.  He needs to work aggressively on weight loss and overall strengthening of his back hips and legs.  He is going to physical therapy already.  I have no further recommendations from my standpoint.  All question concerns were answered and addressed.  Follow-Up Instructions: Return if symptoms worsen or fail to improve.   Orders:  Orders Placed This Encounter  Procedures  . XR HIP UNILAT W OR W/O PELVIS 1V RIGHT   No orders of the defined types were placed in this encounter.     Procedures: No procedures performed   Clinical Data: No additional findings.   Subjective: Chief Complaint  Patient presents with  . Right Hip - Pain  The patient is someone who comes in with a history of a right total hip arthroplasty that we did in 2017 and a left total hip arthroplasty was done elsewhere in early 2000's.  He is someone who is significantly deconditioned and morbidly obese with a BMI of 35.  He ambulates with a rolling walker.  He is been having bilateral hip pain.  Denies any groin pain.  He is dealing with shingles on his right backside.  He is not a diabetic either.  He does have rheumatoid disease.  He reports that both hips hurt when he lays on his side at night.  HPI  Review of Systems He currently denies any headache, chest pain, shortness of breath, fever, chills,  nausea, vomiting.  Objective: Vital Signs: There were no vitals taken for this visit.  Physical Exam He is alert and oriented x3 and in no acute distress Ortho Exam Examination of both hips show the move fluidly with no issues at all range of motion.  He can actually perform a straight leg raise on both sides and is laying flat.  His pain over the trochanteric areas on both sides is only mild.  He does seem to be significantly deconditioned.  There is also component of obesity coming into play. Specialty Comments:  No specialty comments available.  Imaging: Xr Hip Unilat W Or W/o Pelvis 1v Right  Result Date: 06/12/2018 An AP pelvis and lateral of the right hip shows bilateral total hip arthroplasties with no evidence of loosening or complicating features.  There is no evidence of wear.    PMFS History: Patient Active Problem List   Diagnosis Date Noted  . High risk medication use 03/17/2017  . Hyperkalemia 08/01/2016  . Aftercare following surgery of the circulatory system 06/17/2016  . Status post total replacement of right hip 03/22/2016  . ESRD on dialysis ([place]) 11/27/2015  . OSA (obstructive sleep apnea) 09/02/2013  . Chronic combined systolic and diastolic heart failure ([place]) 03/16/2013  .  Allergic rhinitis 12/18/2012  . Long term current use of anticoagulant therapy 11/25/2010  . Genital herpes 05/31/2010  . DM (diabetes mellitus), type 2 with renal complications ([place]) 40/81/4481  . ERECTILE DYSFUNCTION, ORGANIC 09/21/2009  . Osteoarthritis 09/21/2009  . PERSONAL HX COLONIC POLYPS 08/25/2009  . Gout 07/22/2009  . Essential hypertension 07/22/2009  . ATRIAL FIBRILLATION 07/22/2009  . Rheumatoid arthritis ([place]) 07/22/2009  . NEPHROLITHIASIS, HX OF 07/22/2009   Past Medical History:  Diagnosis Date  . Acute blood loss anemia   . Atrial fibrillation (HCC)    Chronic  . Chronic combined systolic and diastolic heart failure ([place])   . Colon polyp 2000  . Dysrhythmia     hx  . ESRD on hemodialysis ([place])    [place]  . Essential hypertension   . Gastritis and gastroduodenitis   . GI bleed   . Gout   . Heart murmur   . History of blood transfusion   . History of kidney stones   . Nephrolithiasis   . OSA (obstructive sleep apnea) 09/02/2013    IMPRESSION :  1. Mild obstructive sleep apnea with hypopneas causing sleep fragmentation and moderate oxygen desaturation.  2. Short runs of nonsustained VT were noted. His beta blocker may need to be titrated 3. Significant PLM's were noted, the PLM arousal index was low. Please correlate with clinical history of restless leg syndrome.  4. Sleep efficiency was poor.  RECOMMENDATION:  1. Treatment options for this degree of sleep disordered breathing include weight loss and positional therapy to avoid supine sleep 2. Consider titrating beta blocker further, defer to cardiologist 3. Patient should be cautioned against driving when sleepy.They should be asked to avoid medications with sedative side effects     . Osteoarthritis of right hip   . Pneumonia    hx 30 yrs ago  . Primary osteoarthritis of right hip   . Rheumatoid arthritis ([place])   . Thrombocytopenia ([place])     Family History  Problem Relation Age of Onset  . Hypertension Mother   . Arthritis Mother        ?RA  . Hypertension Father     Past Surgical History:  Procedure Laterality Date  . AV FISTULA PLACEMENT Left 08/26/2015   Procedure: LEFT RADIOCEPHALIC FISTULA CREATION;  Surgeon: Rosetta Posner, MD;  Location: [facility];  Service: Vascular;  Laterality: Left;  . AV FISTULA PLACEMENT Left 11/23/2015   Procedure: ARTERIOVENOUS (AV) FISTULA CREATION;  Surgeon: Rosetta Posner, MD;  Location: [facility];  Service: Vascular;  Laterality: Left;  . BACK SURGERY     x2- discectomy  . CHOLECYSTECTOMY  1994  . CYSTOSCOPY/RETROGRADE/URETEROSCOPY/STONE EXTRACTION WITH BASKET    . ESOPHAGOGASTRODUODENOSCOPY N/A 11/13/2015   Procedure:  ESOPHAGOGASTRODUODENOSCOPY (EGD);  Surgeon: Irene Shipper, MD;  Location[facility] ENDOSCOPY;  Service: Endoscopy;  Laterality: N/A;  . INSERTION OF DIALYSIS CATHETER Left 11/23/2015   Procedure: INSERTION OF DIALYSIS CATHETER;  Surgeon: Rosetta Posner, MD;  Location: [facility];  Service: Vascular;  Laterality: Left;  . IR GENERIC HISTORICAL Left 08/04/2016   IR DIALY SHUNT INTRO NEEDLE/INTRACATH INITIAL W/IMG LEFT 08/04/2016 Markus Daft, MD [facility]INTER[facility]  . IR GENERIC HISTORICAL  08/04/2016   IR US GUIDE VASC ACCESS LEFT 08/04/2016 Markus Daft, MD [facility]  . JOINT REPLACEMENT     Total L-Hip replacement, Right Knee 10/20/09  . LEFT AND RIGHT HEART CATHETERIZATION WITH CORONARY ANGIOGRAM N/A 02/22/2013   Procedure: LEFT AND RIGHT HEART CATHETERIZATION WITH  CORONARY ANGIOGRAM;  Surgeon: Jolaine Artist, MD;  Location: [facility] CATH LAB;  Service: Cardiovascular;  Laterality: N/A;  . LITHOTRIPSY  90's  . PERIPHERAL VASCULAR CATHETERIZATION Left 11/19/2015   Procedure: A/V/Fistulagram;  Surgeon: Conrad Kenvir, MD;  Location: [place] CV LAB;  Service: Cardiovascular;  Laterality: Left;  . PERIPHERAL VASCULAR CATHETERIZATION Left 07/21/2016   Procedure: A/V Fistulagram;  Surgeon: Conrad Tazlina, MD;  Location: [place] CV LAB;  Service: Cardiovascular;  Laterality: Left;  arm  . REVISON OF ARTERIOVENOUS FISTULA Left 05/30/2016   Procedure: REVISION LEFT UPPER ARM FISTULA;  Surgeon: Conrad [place], MD;  Location: [place];  Service: Vascular;  Laterality: Left;  . REVISON OF ARTERIOVENOUS FISTULA Left 07/22/2016   Procedure: REVISON OF BASILIC VEIN TRANSPOSITION ANASTOMOSIS;  Surgeon: Rosetta Posner, MD;  Location: [place];  Service: Vascular;  Laterality: Left;  . SPINE SURGERY     x 2  . TOTAL HIP ARTHROPLASTY Right 03/22/2016   Procedure: RIGHT TOTAL HIP ARTHROPLASTY ANTERIOR APPROACH;  Surgeon: Mcarthur Rossetti, MD;  Location: [facility];  Service: Orthopedics;  Laterality: Right;   Social History   Occupational History  .  Occupation: retired Personnel officer: RETIRED  Tobacco Use  . Smoking status: Never Smoker  . Smokeless tobacco: Never Used  Substance and Sexual Activity  . Alcohol use: No    Comment: occasional  . Drug use: No  . Sexual activity: Not on file

## 2018-06-15 DIAGNOSIS — N186 End stage renal disease: Secondary | ICD-10-CM | POA: Diagnosis not present

## 2018-06-15 DIAGNOSIS — N2581 Secondary hyperparathyroidism of renal origin: Secondary | ICD-10-CM | POA: Diagnosis not present

## 2018-06-15 DIAGNOSIS — D631 Anemia in chronic kidney disease: Secondary | ICD-10-CM | POA: Diagnosis not present

## 2018-06-15 DIAGNOSIS — I5042 Chronic combined systolic (congestive) and diastolic (congestive) heart failure: Secondary | ICD-10-CM | POA: Diagnosis not present

## 2018-06-15 DIAGNOSIS — I4891 Unspecified atrial fibrillation: Secondary | ICD-10-CM | POA: Diagnosis not present

## 2018-06-15 DIAGNOSIS — Z992 Dependence on renal dialysis: Secondary | ICD-10-CM | POA: Diagnosis not present

## 2018-06-16 DIAGNOSIS — Z992 Dependence on renal dialysis: Secondary | ICD-10-CM | POA: Diagnosis not present

## 2018-06-16 DIAGNOSIS — I5042 Chronic combined systolic (congestive) and diastolic (congestive) heart failure: Secondary | ICD-10-CM | POA: Diagnosis not present

## 2018-06-16 DIAGNOSIS — N186 End stage renal disease: Secondary | ICD-10-CM | POA: Diagnosis not present

## 2018-06-16 DIAGNOSIS — I4891 Unspecified atrial fibrillation: Secondary | ICD-10-CM | POA: Diagnosis not present

## 2018-06-16 DIAGNOSIS — D631 Anemia in chronic kidney disease: Secondary | ICD-10-CM | POA: Diagnosis not present

## 2018-06-16 DIAGNOSIS — N2581 Secondary hyperparathyroidism of renal origin: Secondary | ICD-10-CM | POA: Diagnosis not present

## 2018-06-17 DIAGNOSIS — N186 End stage renal disease: Secondary | ICD-10-CM | POA: Diagnosis not present

## 2018-06-17 DIAGNOSIS — I158 Other secondary hypertension: Secondary | ICD-10-CM | POA: Diagnosis not present

## 2018-06-17 DIAGNOSIS — Z992 Dependence on renal dialysis: Secondary | ICD-10-CM | POA: Diagnosis not present

## 2018-06-18 DIAGNOSIS — N2581 Secondary hyperparathyroidism of renal origin: Secondary | ICD-10-CM | POA: Diagnosis not present

## 2018-06-18 DIAGNOSIS — D631 Anemia in chronic kidney disease: Secondary | ICD-10-CM | POA: Diagnosis not present

## 2018-06-18 DIAGNOSIS — Z992 Dependence on renal dialysis: Secondary | ICD-10-CM | POA: Diagnosis not present

## 2018-06-18 DIAGNOSIS — E8779 Other fluid overload: Secondary | ICD-10-CM | POA: Diagnosis not present

## 2018-06-18 DIAGNOSIS — I4891 Unspecified atrial fibrillation: Secondary | ICD-10-CM | POA: Diagnosis not present

## 2018-06-18 DIAGNOSIS — N186 End stage renal disease: Secondary | ICD-10-CM | POA: Diagnosis not present

## 2018-06-18 DIAGNOSIS — I132 Hypertensive heart and chronic kidney disease with heart failure and with stage 5 chronic kidney disease, or end stage renal disease: Secondary | ICD-10-CM | POA: Diagnosis not present

## 2018-06-18 DIAGNOSIS — I5042 Chronic combined systolic (congestive) and diastolic (congestive) heart failure: Secondary | ICD-10-CM | POA: Diagnosis not present

## 2018-06-19 ENCOUNTER — Ambulatory Visit: Payer: Self-pay | Admitting: *Deleted

## 2018-06-19 ENCOUNTER — Telehealth: Payer: Self-pay | Admitting: Family

## 2018-06-19 NOTE — Telephone Encounter (Signed)
Called patient back regarding the message of needing an ointment for his shingles rash. He saw his provider last Tuesday and was advised on things he could use to help with the discomfort. Pt stated that he had used the cap sapin cream and the salon pads. Neither one helped. He is also taking Tylenol for the discomfort. He has had the rash for about 4 weeks now. He denies having a fever and just doesn't feel good right now. He can not see the rash but it feels hot, itchy and painful. He is requesting a prescription ointment called in. Flow at [facility] called regarding having M. Inda Castle to give him a call back.  Advised to call back for increasing in symptoms. Pt voiced understanding. Reason for Disposition . [1] Shingle rash already diagnosed AND [2] weak immune system (e.g., HIV positive,  cancer chemotherapy, chronic steroid treatment, splenectomy) AND [3]  taking antiviral medication  Answer Assessment - Initial Assessment Questions 1. APPEARANCE of RASH: "Describe the rash."      Can not see 2. LOCATION: "Where is the rash located?"      Right upper buttocks 3. ONSET: "When did the rash start?"      About 4 weeks ago 4. ITCHING: "Does the rash itch?" If so, ask: "How bad is the itch?"  (Scale 1-10; or mild, moderate, severe)     Itches, #9 5. PAIN: "Does the rash hurt?" If so, ask: "How bad is the pain?"  (Scale 1-10; or mild, moderate, severe)     Pain # 8 or 9 6. OTHER SYMPTOMS: "Do you have any other symptoms?" (e.g., fever)     no  Protocols used: [facility]

## 2018-06-19 NOTE — Telephone Encounter (Signed)
Copied from [place] [id number]. Topic: Quick Communication - See Telephone Encounter >> Jun 19, 2018  3:53 PM Rutherford Nail, NT wrote: CRM for notification. See Telephone encounter for: 06/19/18. Charise with [facility] calling and states that the patient had refused his physical therapy appointment last week due to being tired from going back and forth to the doctor and because he has shingles. Please advise.  CB#: [phone]

## 2018-06-20 DIAGNOSIS — Z992 Dependence on renal dialysis: Secondary | ICD-10-CM | POA: Diagnosis not present

## 2018-06-20 DIAGNOSIS — I5042 Chronic combined systolic (congestive) and diastolic (congestive) heart failure: Secondary | ICD-10-CM | POA: Diagnosis not present

## 2018-06-20 DIAGNOSIS — N2581 Secondary hyperparathyroidism of renal origin: Secondary | ICD-10-CM | POA: Diagnosis not present

## 2018-06-20 DIAGNOSIS — I4891 Unspecified atrial fibrillation: Secondary | ICD-10-CM | POA: Diagnosis not present

## 2018-06-20 DIAGNOSIS — D631 Anemia in chronic kidney disease: Secondary | ICD-10-CM | POA: Diagnosis not present

## 2018-06-20 DIAGNOSIS — N186 End stage renal disease: Secondary | ICD-10-CM | POA: Diagnosis not present

## 2018-06-21 DIAGNOSIS — N186 End stage renal disease: Secondary | ICD-10-CM | POA: Diagnosis not present

## 2018-06-21 DIAGNOSIS — E1122 Type 2 diabetes mellitus with diabetic chronic kidney disease: Secondary | ICD-10-CM | POA: Diagnosis not present

## 2018-06-21 DIAGNOSIS — I5042 Chronic combined systolic (congestive) and diastolic (congestive) heart failure: Secondary | ICD-10-CM | POA: Diagnosis not present

## 2018-06-21 DIAGNOSIS — M069 Rheumatoid arthritis, unspecified: Secondary | ICD-10-CM | POA: Diagnosis not present

## 2018-06-21 DIAGNOSIS — Z471 Aftercare following joint replacement surgery: Secondary | ICD-10-CM | POA: Diagnosis not present

## 2018-06-21 DIAGNOSIS — I132 Hypertensive heart and chronic kidney disease with heart failure and with stage 5 chronic kidney disease, or end stage renal disease: Secondary | ICD-10-CM | POA: Diagnosis not present

## 2018-06-22 DIAGNOSIS — N186 End stage renal disease: Secondary | ICD-10-CM | POA: Diagnosis not present

## 2018-06-22 DIAGNOSIS — Z992 Dependence on renal dialysis: Secondary | ICD-10-CM | POA: Diagnosis not present

## 2018-06-22 DIAGNOSIS — I4891 Unspecified atrial fibrillation: Secondary | ICD-10-CM | POA: Diagnosis not present

## 2018-06-22 DIAGNOSIS — I5042 Chronic combined systolic (congestive) and diastolic (congestive) heart failure: Secondary | ICD-10-CM | POA: Diagnosis not present

## 2018-06-22 DIAGNOSIS — D631 Anemia in chronic kidney disease: Secondary | ICD-10-CM | POA: Diagnosis not present

## 2018-06-22 DIAGNOSIS — N2581 Secondary hyperparathyroidism of renal origin: Secondary | ICD-10-CM | POA: Diagnosis not present

## 2018-06-22 MED ORDER — PREGABALIN 25 MG PO CAPS: ORAL_CAPSULE | ORAL | 0 refills | Status: DC

## 2018-06-22 NOTE — Addendum Note (Signed)
Addended by: Debbrah Alar on: 06/22/2018 11:18 AM   Modules accepted: Orders

## 2018-06-22 NOTE — Telephone Encounter (Signed)
Notified pt and he voices understanding and will try Lyrica. Follow up has been scheduled for 07/03/18 at 8:40am as pt can only come in on Tuesdays or Thursdays.

## 2018-06-22 NOTE — Telephone Encounter (Signed)
Please let pt know that I would recommend d/c gabapentin and start lyrica once daily dosed after dialysis.  I would like to see him back in the office in 2 weeks. If symptoms are improved and we need to continue this long term we will need to have him sign a controlled substance contract since Lyrica is controlled. We are limited in our other options due to his kidneys.

## 2018-06-23 DIAGNOSIS — I4891 Unspecified atrial fibrillation: Secondary | ICD-10-CM | POA: Diagnosis not present

## 2018-06-23 DIAGNOSIS — Z992 Dependence on renal dialysis: Secondary | ICD-10-CM | POA: Diagnosis not present

## 2018-06-23 DIAGNOSIS — D631 Anemia in chronic kidney disease: Secondary | ICD-10-CM | POA: Diagnosis not present

## 2018-06-23 DIAGNOSIS — I5042 Chronic combined systolic (congestive) and diastolic (congestive) heart failure: Secondary | ICD-10-CM | POA: Diagnosis not present

## 2018-06-23 DIAGNOSIS — N186 End stage renal disease: Secondary | ICD-10-CM | POA: Diagnosis not present

## 2018-06-23 DIAGNOSIS — N2581 Secondary hyperparathyroidism of renal origin: Secondary | ICD-10-CM | POA: Diagnosis not present

## 2018-06-25 DIAGNOSIS — I4891 Unspecified atrial fibrillation: Secondary | ICD-10-CM | POA: Diagnosis not present

## 2018-06-25 DIAGNOSIS — N186 End stage renal disease: Secondary | ICD-10-CM | POA: Diagnosis not present

## 2018-06-25 DIAGNOSIS — N2581 Secondary hyperparathyroidism of renal origin: Secondary | ICD-10-CM | POA: Diagnosis not present

## 2018-06-25 DIAGNOSIS — Z992 Dependence on renal dialysis: Secondary | ICD-10-CM | POA: Diagnosis not present

## 2018-06-25 DIAGNOSIS — I5042 Chronic combined systolic (congestive) and diastolic (congestive) heart failure: Secondary | ICD-10-CM | POA: Diagnosis not present

## 2018-06-25 DIAGNOSIS — D631 Anemia in chronic kidney disease: Secondary | ICD-10-CM | POA: Diagnosis not present

## 2018-06-27 DIAGNOSIS — I4891 Unspecified atrial fibrillation: Secondary | ICD-10-CM | POA: Diagnosis not present

## 2018-06-27 DIAGNOSIS — D631 Anemia in chronic kidney disease: Secondary | ICD-10-CM | POA: Diagnosis not present

## 2018-06-27 DIAGNOSIS — Z992 Dependence on renal dialysis: Secondary | ICD-10-CM | POA: Diagnosis not present

## 2018-06-27 DIAGNOSIS — I5042 Chronic combined systolic (congestive) and diastolic (congestive) heart failure: Secondary | ICD-10-CM | POA: Diagnosis not present

## 2018-06-27 DIAGNOSIS — N186 End stage renal disease: Secondary | ICD-10-CM | POA: Diagnosis not present

## 2018-06-27 DIAGNOSIS — N2581 Secondary hyperparathyroidism of renal origin: Secondary | ICD-10-CM | POA: Diagnosis not present

## 2018-06-29 DIAGNOSIS — N2581 Secondary hyperparathyroidism of renal origin: Secondary | ICD-10-CM | POA: Diagnosis not present

## 2018-06-29 DIAGNOSIS — D631 Anemia in chronic kidney disease: Secondary | ICD-10-CM | POA: Diagnosis not present

## 2018-06-29 DIAGNOSIS — N186 End stage renal disease: Secondary | ICD-10-CM | POA: Diagnosis not present

## 2018-06-29 DIAGNOSIS — I4891 Unspecified atrial fibrillation: Secondary | ICD-10-CM | POA: Diagnosis not present

## 2018-06-29 DIAGNOSIS — I5042 Chronic combined systolic (congestive) and diastolic (congestive) heart failure: Secondary | ICD-10-CM | POA: Diagnosis not present

## 2018-06-29 DIAGNOSIS — Z992 Dependence on renal dialysis: Secondary | ICD-10-CM | POA: Diagnosis not present

## 2018-06-30 DIAGNOSIS — Z992 Dependence on renal dialysis: Secondary | ICD-10-CM | POA: Diagnosis not present

## 2018-06-30 DIAGNOSIS — I4891 Unspecified atrial fibrillation: Secondary | ICD-10-CM | POA: Diagnosis not present

## 2018-06-30 DIAGNOSIS — N2581 Secondary hyperparathyroidism of renal origin: Secondary | ICD-10-CM | POA: Diagnosis not present

## 2018-06-30 DIAGNOSIS — N186 End stage renal disease: Secondary | ICD-10-CM | POA: Diagnosis not present

## 2018-06-30 DIAGNOSIS — I5042 Chronic combined systolic (congestive) and diastolic (congestive) heart failure: Secondary | ICD-10-CM | POA: Diagnosis not present

## 2018-06-30 DIAGNOSIS — D631 Anemia in chronic kidney disease: Secondary | ICD-10-CM | POA: Diagnosis not present

## 2018-07-02 DIAGNOSIS — I5042 Chronic combined systolic (congestive) and diastolic (congestive) heart failure: Secondary | ICD-10-CM | POA: Diagnosis not present

## 2018-07-02 DIAGNOSIS — I4891 Unspecified atrial fibrillation: Secondary | ICD-10-CM | POA: Diagnosis not present

## 2018-07-02 DIAGNOSIS — N186 End stage renal disease: Secondary | ICD-10-CM | POA: Diagnosis not present

## 2018-07-02 DIAGNOSIS — D631 Anemia in chronic kidney disease: Secondary | ICD-10-CM | POA: Diagnosis not present

## 2018-07-02 DIAGNOSIS — Z992 Dependence on renal dialysis: Secondary | ICD-10-CM | POA: Diagnosis not present

## 2018-07-02 DIAGNOSIS — N2581 Secondary hyperparathyroidism of renal origin: Secondary | ICD-10-CM | POA: Diagnosis not present

## 2018-07-03 ENCOUNTER — Ambulatory Visit (INDEPENDENT_AMBULATORY_CARE_PROVIDER_SITE_OTHER): Payer: Medicare Other | Admitting: Family

## 2018-07-03 ENCOUNTER — Encounter: Payer: Self-pay | Admitting: Family

## 2018-07-03 ENCOUNTER — Telehealth: Payer: Self-pay

## 2018-07-03 VITALS — BP 112/56 | HR 86 | Temp 98.0°F | Resp 16 | Ht 75.0 in | Wt 284.0 lb

## 2018-07-03 DIAGNOSIS — B0229 Other postherpetic nervous system involvement: Secondary | ICD-10-CM

## 2018-07-03 MED ORDER — LIDOCAINE 5 % EX PTCH: 1.0000 | MEDICATED_PATCH | CUTANEOUS | 0 refills | Status: DC

## 2018-07-03 MED ORDER — LIDOCAINE 5 % EX PTCH: 1.0000 | MEDICATED_PATCH | CUTANEOUS | 2 refills | Status: DC

## 2018-07-03 MED ORDER — GABAPENTIN 300 MG PO CAPS: 300.0000 mg | ORAL_CAPSULE | Freq: Three times a day (TID) | ORAL | 3 refills | Status: DC

## 2018-07-03 MED ORDER — ZOSTER VAC RECOMB ADJUVANTED 50 MCG/0.5ML IM SUSR: 0.5000 mL | Freq: Once | INTRAMUSCULAR | 1 refills | Status: AC

## 2018-07-03 NOTE — Telephone Encounter (Signed)
PA approved through 07/04/2019.

## 2018-07-03 NOTE — Telephone Encounter (Signed)
PA initiated via Covermymeds; KEY: [id number]. Awaiting determination.

## 2018-07-03 NOTE — Patient Instructions (Signed)
Stop lyrica, restart gabapentin. You can try capsaicin cream as needed. Also, you can try lidoderm patches as needed.

## 2018-07-03 NOTE — Progress Notes (Signed)
Subjective:    Patient ID: Bruce Cole, male    DOB: 15-May-1941, 77 y.o.   MRN: [id number]  HPI  Patient is a 77 yr old male who presents today for follow up of his herpes zoster. We last saw him on 06/08/18.  He was having discomfort and we recommended trial of capsaicin cream and salon pas pads. We also gave him a trial of lyrica in place of gabapentin. He feels that gabapentin worked better.      Review of Systems    see HPI  Past Medical History:  Diagnosis Date  . Acute blood loss anemia   . Atrial fibrillation (HCC)    Chronic  . Chronic combined systolic and diastolic heart failure ([place])   . Colon polyp 2000  . Dysrhythmia    hx  . ESRD on hemodialysis ([place])    [place]  . Essential hypertension   . Gastritis and gastroduodenitis   . GI bleed   . Gout   . Heart murmur   . History of blood transfusion   . History of kidney stones   . Nephrolithiasis   . OSA (obstructive sleep apnea) 09/02/2013    IMPRESSION :  1. Mild obstructive sleep apnea with hypopneas causing sleep fragmentation and moderate oxygen desaturation.  2. Short runs of nonsustained VT were noted. His beta blocker may need to be titrated 3. Significant PLM's were noted, the PLM arousal index was low. Please correlate with clinical history of restless leg syndrome.  4. Sleep efficiency was poor.  RECOMMENDATION:  1. Treatment options for this degree of sleep disordered breathing include weight loss and positional therapy to avoid supine sleep 2. Consider titrating beta blocker further, defer to cardiologist 3. Patient should be cautioned against driving when sleepy.They should be asked to avoid medications with sedative side effects     . Osteoarthritis of right hip   . Pneumonia    hx 30 yrs ago  . Primary osteoarthritis of right hip   . Rheumatoid arthritis ([place])   . Thrombocytopenia ([place])      Social History   Socioeconomic History  . Marital status: Single    Spouse  name: Not on file  . Number of children: 3  . Years of education: Not on file  . Highest education level: Not on file  Occupational History  . Occupation: retired Personnel officer: RETIRED  Social Needs  . Financial resource strain: Not on file  . Food insecurity:    Worry: Not on file    Inability: Not on file  . Transportation needs:    Medical: Not on file    Non-medical: Not on file  Tobacco Use  . Smoking status: Never Smoker  . Smokeless tobacco: Never Used  Substance and Sexual Activity  . Alcohol use: No    Comment: occasional  . Drug use: No  . Sexual activity: Not on file  Lifestyle  . Physical activity:    Days per week: Not on file    Minutes per session: Not on file  . Stress: Not on file  Relationships  . Social connections:    Talks on phone: Not on file    Gets together: Not on file    Attends religious service: Not on file    Active member of club or organization: Not on file    Attends meetings of clubs or organizations: Not on file    Relationship status: Not on  file  . Intimate partner violence:    Fear of current or ex partner: Not on file    Emotionally abused: Not on file    Physically abused: Not on file    Forced sexual activity: Not on file  Other Topics Concern  . Not on file  Social History Narrative   Former [employer]   Admitted to [facility] 11/25/15   Widowed   Never smoked   FULL CODE    Past Surgical History:  Procedure Laterality Date  . AV FISTULA PLACEMENT Left 08/26/2015   Procedure: LEFT RADIOCEPHALIC FISTULA CREATION;  Surgeon: Rosetta Posner, MD;  Location: [facility];  Service: Vascular;  Laterality: Left;  . AV FISTULA PLACEMENT Left 11/23/2015   Procedure: ARTERIOVENOUS (AV) FISTULA CREATION;  Surgeon: Rosetta Posner, MD;  Location: [place];  Service: Vascular;  Laterality: Left;  . BACK SURGERY     x2- discectomy  . CHOLECYSTECTOMY  1994  . CYSTOSCOPY/RETROGRADE/URETEROSCOPY/STONE  EXTRACTION WITH BASKET    . ESOPHAGOGASTRODUODENOSCOPY N/A 11/13/2015   Procedure: ESOPHAGOGASTRODUODENOSCOPY (EGD);  Surgeon: Irene Shipper, MD;  Location: [facility] ENDOSCOPY;  Service: Endoscopy;  Laterality: N/A;  . INSERTION OF DIALYSIS CATHETER Left 11/23/2015   Procedure: INSERTION OF DIALYSIS CATHETER;  Surgeon: Rosetta Posner, MD;  Location: [place];  Service: Vascular;  Laterality: Left;  . IR GENERIC HISTORICAL Left 08/04/2016   IR DIALY SHUNT INTRO NEEDLE/INTRACATH INITIAL W/IMG LEFT 08/04/2016 Markus Daft, MD [facility]INTER[facility]  . IR GENERIC HISTORICAL  08/04/2016   IR US GUIDE VASC ACCESS LEFT 08/04/2016 Markus Daft, MD [facility]  . JOINT REPLACEMENT     Total L-Hip replacement, Right Knee 10/20/09  . LEFT AND RIGHT HEART CATHETERIZATION WITH CORONARY ANGIOGRAM N/A 02/22/2013   Procedure: LEFT AND RIGHT HEART CATHETERIZATION WITH CORONARY ANGIOGRAM;  Surgeon: Jolaine Artist, MD;  Location: [facility] CATH LAB;  Service: Cardiovascular;  Laterality: N/A;  . LITHOTRIPSY  90's  . PERIPHERAL VASCULAR CATHETERIZATION Left 11/19/2015   Procedure: A/V/Fistulagram;  Surgeon: Conrad [place], MD;  Location: [place] CV LAB;  Service: Cardiovascular;  Laterality: Left;  . PERIPHERAL VASCULAR CATHETERIZATION Left 07/21/2016   Procedure: A/V Fistulagram;  Surgeon: Conrad Amasa, MD;  Location: [place] CV LAB;  Service: Cardiovascular;  Laterality: Left;  arm  . REVISON OF ARTERIOVENOUS FISTULA Left 05/30/2016   Procedure: REVISION LEFT UPPER ARM FISTULA;  Surgeon: Conrad Kathryn, MD;  Location: [place];  Service: Vascular;  Laterality: Left;  . REVISON OF ARTERIOVENOUS FISTULA Left 07/22/2016   Procedure: REVISON OF BASILIC VEIN TRANSPOSITION ANASTOMOSIS;  Surgeon: Rosetta Posner, MD;  Location: [place];  Service: Vascular;  Laterality: Left;  . SPINE SURGERY     x 2  . TOTAL HIP ARTHROPLASTY Right 03/22/2016   Procedure: RIGHT TOTAL HIP ARTHROPLASTY ANTERIOR APPROACH;  Surgeon: Mcarthur Rossetti, MD;  Location: [place];  Service:  Orthopedics;  Laterality: Right;    Family History  Problem Relation Age of Onset  . Hypertension Mother   . Arthritis Mother        ?RA  . Hypertension Father     Allergies  Allergen Reactions  . Ace Inhibitors Other (See Comments)    Worsening renal insufficiency    Current Outpatient Medications on File Prior to Visit  Medication Sig Dispense Refill  . acetaminophen (TYLENOL) 500 MG tablet Take 500 mg by mouth every 6 (six) hours as needed for mild pain.    [name]  albuterol (PROVENTIL) (2.5 MG/3ML) 0.083% nebulizer solution Take 2.5 mg by nebulization every 4 (four) hours as needed for shortness of breath. Reported on 09/25/2015    . atorvastatin (LIPITOR) 10 MG tablet TAKE 1 TABLET(10 MG) BY MOUTH DAILY 90 tablet 1  . B Complex-C-Folic Acid (RENA-VITE RX) 1 MG TABS Take 1 tablet by mouth at bedtime.   6  . colchicine 0.6 MG tablet Take 1/2 tablet by mouth once as needed for gout flare. Call if no improvement 15 tablet 0  . fluticasone (FLONASE) 50 MCG/ACT nasal spray INSTILL 2 SPRAYS INTO BOTH NOSTRILS EVERY DAY 16 g 5  . furosemide (LASIX) 40 MG tablet TAKE 1 TABLET(40 MG) BY MOUTH TWICE DAILY 180 tablet 1  . hydroxychloroquine (PLAQUENIL) 200 MG tablet TAKE 1 TABLET(200 MG) BY MOUTH DAILY 90 tablet 1  . pregabalin (LYRICA) 25 MG capsule 1 cap by mouth once daily after dialysis. 30 capsule 0  . RENVELA 800 MG tablet Take 3 tablets (2,400 mg total) by mouth 3 (three) times daily with meals. 90 tablet 0  . warfarin (COUMADIN) 7.5 MG tablet TAKE 1 TABLET BY MOUTH DAILY AS DIRECTED BY COUMADIN CLINIC 100 tablet 0   No current facility-administered medications on file prior to visit.     BP (!) 112/56 (BP Location: Right Arm, Patient Position: Sitting, Cuff Size: Large)   Pulse 86   Temp 98 F (36.7 C) (Oral)   Resp 16   Ht 6\' 3"  (1.905 m)   Wt 284 lb (128.8 kg)   SpO2 100%   BMI 35.50 kg/m    Objective:   Physical Exam Constitutional:      General: He is not in acute  distress.    Appearance: Normal appearance. He is well-developed.  HENT:     Head: Normocephalic and atraumatic.  Skin:    General: Skin is warm and dry.     Comments: Small area of dry scabbed skin on right/posterior buttock  Neurological:     Mental Status: He is alert and oriented to person, place, and time.  Psychiatric:        Mood and Affect: Mood normal.        Behavior: Behavior normal.        Thought Content: Thought content normal.           Assessment & Plan:  Post-Herpetic neuralgia- advised pt as follows: Stop lyrica, restart gabapentin. You can try capsaicin cream as needed. Also, you can try lidoderm patches as needed.

## 2018-07-04 DIAGNOSIS — I5042 Chronic combined systolic (congestive) and diastolic (congestive) heart failure: Secondary | ICD-10-CM | POA: Diagnosis not present

## 2018-07-04 DIAGNOSIS — N2581 Secondary hyperparathyroidism of renal origin: Secondary | ICD-10-CM | POA: Diagnosis not present

## 2018-07-04 DIAGNOSIS — N186 End stage renal disease: Secondary | ICD-10-CM | POA: Diagnosis not present

## 2018-07-04 DIAGNOSIS — D631 Anemia in chronic kidney disease: Secondary | ICD-10-CM | POA: Diagnosis not present

## 2018-07-04 DIAGNOSIS — Z992 Dependence on renal dialysis: Secondary | ICD-10-CM | POA: Diagnosis not present

## 2018-07-04 DIAGNOSIS — I4891 Unspecified atrial fibrillation: Secondary | ICD-10-CM | POA: Diagnosis not present

## 2018-07-05 ENCOUNTER — Encounter (HOSPITAL_BASED_OUTPATIENT_CLINIC_OR_DEPARTMENT_OTHER): Payer: Self-pay | Admitting: *Deleted

## 2018-07-05 ENCOUNTER — Emergency Department (HOSPITAL_BASED_OUTPATIENT_CLINIC_OR_DEPARTMENT_OTHER): Payer: Medicare Other

## 2018-07-05 ENCOUNTER — Other Ambulatory Visit: Payer: Self-pay

## 2018-07-05 ENCOUNTER — Emergency Department (HOSPITAL_BASED_OUTPATIENT_CLINIC_OR_DEPARTMENT_OTHER)
Admission: EM | Admit: 2018-07-05 | Discharge: 2018-07-05 | Disposition: A | Discharge status: Home / Self Care | Payer: Medicare Other | Attending: Emergency Medicine | Admitting: Emergency Medicine

## 2018-07-05 DIAGNOSIS — R1084 Generalized abdominal pain: Secondary | ICD-10-CM

## 2018-07-05 DIAGNOSIS — I120 Hypertensive chronic kidney disease with stage 5 chronic kidney disease or end stage renal disease: Secondary | ICD-10-CM | POA: Diagnosis not present

## 2018-07-05 DIAGNOSIS — E1122 Type 2 diabetes mellitus with diabetic chronic kidney disease: Secondary | ICD-10-CM | POA: Insufficient documentation

## 2018-07-05 DIAGNOSIS — R109 Unspecified abdominal pain: Secondary | ICD-10-CM | POA: Diagnosis present

## 2018-07-05 DIAGNOSIS — I5042 Chronic combined systolic (congestive) and diastolic (congestive) heart failure: Secondary | ICD-10-CM | POA: Diagnosis not present

## 2018-07-05 DIAGNOSIS — I132 Hypertensive heart and chronic kidney disease with heart failure and with stage 5 chronic kidney disease, or end stage renal disease: Secondary | ICD-10-CM | POA: Diagnosis not present

## 2018-07-05 DIAGNOSIS — R112 Nausea with vomiting, unspecified: Secondary | ICD-10-CM | POA: Insufficient documentation

## 2018-07-05 DIAGNOSIS — Z992 Dependence on renal dialysis: Secondary | ICD-10-CM | POA: Diagnosis not present

## 2018-07-05 DIAGNOSIS — M069 Rheumatoid arthritis, unspecified: Secondary | ICD-10-CM | POA: Diagnosis not present

## 2018-07-05 DIAGNOSIS — Z79899 Other long term (current) drug therapy: Secondary | ICD-10-CM | POA: Insufficient documentation

## 2018-07-05 DIAGNOSIS — N186 End stage renal disease: Secondary | ICD-10-CM | POA: Insufficient documentation

## 2018-07-05 DIAGNOSIS — N281 Cyst of kidney, acquired: Secondary | ICD-10-CM | POA: Diagnosis not present

## 2018-07-05 DIAGNOSIS — Z7901 Long term (current) use of anticoagulants: Secondary | ICD-10-CM | POA: Insufficient documentation

## 2018-07-05 LAB — CBC WITH DIFFERENTIAL/PLATELET
Abs Immature Granulocytes: 0.03 10*3/uL (ref 0.00–0.07)
Basophils Absolute: 0 10*3/uL (ref 0.0–0.1)
Basophils Relative: 0 %
EOS ABS: 0.1 10*3/uL (ref 0.0–0.5)
Eosinophils Relative: 0 %
HCT: 32.9 % — ABNORMAL LOW (ref 39.0–52.0)
Hemoglobin: 9.8 g/dL — ABNORMAL LOW (ref 13.0–17.0)
Immature Granulocytes: 0 %
Lymphocytes Relative: 4 %
Lymphs Abs: 0.5 10*3/uL — ABNORMAL LOW (ref 0.7–4.0)
MCH: 31.1 pg (ref 26.0–34.0)
MCHC: 29.8 g/dL — ABNORMAL LOW (ref 30.0–36.0)
MCV: 104.4 fL — ABNORMAL HIGH (ref 80.0–100.0)
Monocytes Absolute: 0.7 10*3/uL (ref 0.1–1.0)
Monocytes Relative: 6 %
NRBC: 0 % (ref 0.0–0.2)
Neutro Abs: 10 10*3/uL — ABNORMAL HIGH (ref 1.7–7.7)
Neutrophils Relative %: 90 %
Platelets: 121 10*3/uL — ABNORMAL LOW (ref 150–400)
RBC: 3.15 MIL/uL — ABNORMAL LOW (ref 4.22–5.81)
RDW: 16.4 % — AB (ref 11.5–15.5)
WBC: 11.3 10*3/uL — ABNORMAL HIGH (ref 4.0–10.5)

## 2018-07-05 LAB — PROTIME-INR
INR: 2.14
Prothrombin Time: 23.6 seconds — ABNORMAL HIGH (ref 11.4–15.2)

## 2018-07-05 LAB — COMPREHENSIVE METABOLIC PANEL
ALT: 14 U/L (ref 0–44)
AST: 25 U/L (ref 15–41)
Albumin: 3.9 g/dL (ref 3.5–5.0)
Alkaline Phosphatase: 173 U/L — ABNORMAL HIGH (ref 38–126)
Anion gap: 13 (ref 5–15)
BUN: 45 mg/dL — ABNORMAL HIGH (ref 8–23)
CO2: 26 mmol/L (ref 22–32)
Calcium: 9.3 mg/dL (ref 8.9–10.3)
Chloride: 101 mmol/L (ref 98–111)
Creatinine, Ser: 7.31 mg/dL — ABNORMAL HIGH (ref 0.61–1.24)
GFR calc Af Amer: 8 mL/min — ABNORMAL LOW (ref >60)
GFR calc non Af Amer: 7 mL/min — ABNORMAL LOW (ref >60)
Glucose, Bld: 126 mg/dL — ABNORMAL HIGH (ref 70–99)
POTASSIUM: 4.1 mmol/L (ref 3.5–5.1)
SODIUM: 140 mmol/L (ref 135–145)
Total Bilirubin: 0.5 mg/dL (ref 0.3–1.2)
Total Protein: 8 g/dL (ref 6.5–8.1)

## 2018-07-05 LAB — LIPASE, BLOOD: Lipase: 68 U/L — ABNORMAL HIGH (ref 11–51)

## 2018-07-05 LAB — TROPONIN I: Troponin I: 0.1 ng/mL (ref <0.03)

## 2018-07-05 MED ORDER — ONDANSETRON HCL 4 MG/2ML IJ SOLN
4.0000 mg | Freq: Once | INTRAMUSCULAR | Status: AC
  Administered 2018-07-05: 4 mg via INTRAVENOUS
  Filled 2018-07-05: qty 2

## 2018-07-05 MED ORDER — ONDANSETRON 4 MG PO TBDP: 4.0000 mg | ORAL_TABLET | Freq: Three times a day (TID) | ORAL | 0 refills | Status: DC | PRN

## 2018-07-05 MED ORDER — SODIUM CHLORIDE 0.9 % IV BOLUS
500.0000 mL | Freq: Once | INTRAVENOUS | Status: AC
  Administered 2018-07-05: 500 mL via INTRAVENOUS

## 2018-07-05 MED ORDER — PROMETHAZINE HCL 25 MG/ML IJ SOLN
12.5000 mg | Freq: Once | INTRAMUSCULAR | Status: AC
  Administered 2018-07-05: 12.5 mg via INTRAVENOUS
  Filled 2018-07-05: qty 1

## 2018-07-05 MED ORDER — MORPHINE SULFATE (PF) 4 MG/ML IV SOLN
4.0000 mg | Freq: Once | INTRAVENOUS | Status: AC
  Administered 2018-07-05: 4 mg via INTRAVENOUS
  Filled 2018-07-05: qty 1

## 2018-07-05 NOTE — ED Triage Notes (Signed)
Pt has been actively vomiting since noon.

## 2018-07-05 NOTE — ED Notes (Signed)
Gave patient gingerale for PO challenge; instructed patient to take occasional sips. Verbalized understanding.

## 2018-07-05 NOTE — ED Provider Notes (Signed)
[facility] EMERGENCY DEPARTMENT Provider Note   CSN: [id number] Arrival date & time: 07/05/18  1525     History   Chief Complaint Chief Complaint  Patient presents with  . Abdominal Pain    HPI Bruce Cole is a 77 y.o. male.  Pt presents to the ED today with abdominal pain and n/v.  Pt said sx started suddenly around noon.  He said he feels like he was punched in the gut.  He is a denies diarrhea.  No f/c.  The pt does have a hx of afib and is on coumadin.  Pt is also a dialysis patient and goes MWF.  He did go yesterday and has not skipped any doses.     Past Medical History:  Diagnosis Date  . Acute blood loss anemia   . Atrial fibrillation (HCC)    Chronic  . Chronic combined systolic and diastolic heart failure ([place])   . Colon polyp 2000  . Dysrhythmia    hx  . ESRD on hemodialysis ([place])    [place]  . Essential hypertension   . Gastritis and gastroduodenitis   . GI bleed   . Gout   . Heart murmur   . History of blood transfusion   . History of kidney stones   . Nephrolithiasis   . OSA (obstructive sleep apnea) 09/02/2013    IMPRESSION :  1. Mild obstructive sleep apnea with hypopneas causing sleep fragmentation and moderate oxygen desaturation.  2. Short runs of nonsustained VT were noted. His beta blocker may need to be titrated 3. Significant PLM's were noted, the PLM arousal index was low. Please correlate with clinical history of restless leg syndrome.  4. Sleep efficiency was poor.  RECOMMENDATION:  1. Treatment options for this degree of sleep disordered breathing include weight loss and positional therapy to avoid supine sleep 2. Consider titrating beta blocker further, defer to cardiologist 3. Patient should be cautioned against driving when sleepy.They should be asked to avoid medications with sedative side effects     . Osteoarthritis of right hip   . Pneumonia    hx 30 yrs ago  . Primary osteoarthritis of right hip     . Rheumatoid arthritis ([place])   . Thrombocytopenia [facility])     Patient Active Problem List   Diagnosis Date Noted  . High risk medication use 03/17/2017  . Hyperkalemia 08/01/2016  . Aftercare following surgery of the circulatory system 06/17/2016  . Status post total replacement of right hip 03/22/2016  . ESRD on dialysis ([place]) 11/27/2015  . OSA (obstructive sleep apnea) 09/02/2013  . Chronic combined systolic and diastolic heart failure ([place]) 03/16/2013  . Allergic rhinitis 12/18/2012  . Long term current use of anticoagulant therapy 11/25/2010  . Genital herpes 05/31/2010  . DM (diabetes mellitus), type 2 with renal complications ([place]) 51/08/5850  . ERECTILE DYSFUNCTION, ORGANIC 09/21/2009  . Osteoarthritis 09/21/2009  . PERSONAL HX COLONIC POLYPS 08/25/2009  . Gout 07/22/2009  . Essential hypertension 07/22/2009  . ATRIAL FIBRILLATION 07/22/2009  . Rheumatoid arthritis ([place]) 07/22/2009  . NEPHROLITHIASIS, HX OF 07/22/2009    Past Surgical History:  Procedure Laterality Date  . AV FISTULA PLACEMENT Left 08/26/2015   Procedure: LEFT RADIOCEPHALIC FISTULA CREATION;  Surgeon: Rosetta Posner, MD;  Location: [facility];  Service: Vascular;  Laterality: Left;  . AV FISTULA PLACEMENT Left 11/23/2015   Procedure: ARTERIOVENOUS (AV) FISTULA CREATION;  Surgeon: Rosetta Posner, MD;  Location: [facility];  Service:  Vascular;  Laterality: Left;  . BACK SURGERY     x2- discectomy  . CHOLECYSTECTOMY  1994  . CYSTOSCOPY/RETROGRADE/URETEROSCOPY/STONE EXTRACTION WITH BASKET    . ESOPHAGOGASTRODUODENOSCOPY N/A 11/13/2015   Procedure: ESOPHAGOGASTRODUODENOSCOPY (EGD);  Surgeon: Irene Shipper, MD;  Location: [facility] ENDOSCOPY;  Service: Endoscopy;  Laterality: N/A;  . INSERTION OF DIALYSIS CATHETER Left 11/23/2015   Procedure: INSERTION OF DIALYSIS CATHETER;  Surgeon: Rosetta Posner, MD;  Location: [place];  Service: Vascular;  Laterality: Left;  . IR GENERIC HISTORICAL Left 08/04/2016   IR DIALY SHUNT INTRO NEEDLE/INTRACATH  INITIAL W/IMG LEFT 08/04/2016 Markus Daft, MD [facility]-INTERV RAD  . IR GENERIC HISTORICAL  08/04/2016   IR US GUIDE VASC ACCESS LEFT 08/04/2016 Markus Daft, MD [facility]INTER[facility]  . JOINT REPLACEMENT     Total L-Hip replacement, Right Knee 10/20/09  . LEFT AND RIGHT HEART CATHETERIZATION WITH CORONARY ANGIOGRAM N/A 02/22/2013   Procedure: LEFT AND RIGHT HEART CATHETERIZATION WITH CORONARY ANGIOGRAM;  Surgeon: Jolaine Artist, MD;  Location: [facility] CATH LAB;  Service: Cardiovascular;  Laterality: N/A;  . LITHOTRIPSY  90's  . PERIPHERAL VASCULAR CATHETERIZATION Left 11/19/2015   Procedure: A/V/Fistulagram;  Surgeon: Conrad Lakeville, MD;  Location: [place] CV LAB;  Service: Cardiovascular;  Laterality: Left;  . PERIPHERAL VASCULAR CATHETERIZATION Left 07/21/2016   Procedure: A/V Fistulagram;  Surgeon: Conrad Amherst, MD;  Location: [place] CV LAB;  Service: Cardiovascular;  Laterality: Left;  arm  . REVISON OF ARTERIOVENOUS FISTULA Left 05/30/2016   Procedure: REVISION LEFT UPPER ARM FISTULA;  Surgeon: Conrad McDermitt, MD;  Location: [facility];  Service: Vascular;  Laterality: Left;  . REVISON OF ARTERIOVENOUS FISTULA Left 07/22/2016   Procedure: REVISON OF BASILIC VEIN TRANSPOSITION ANASTOMOSIS;  Surgeon: Rosetta Posner, MD;  Location: [place];  Service: Vascular;  Laterality: Left;  . SPINE SURGERY     x 2  . TOTAL HIP ARTHROPLASTY Right 03/22/2016   Procedure: RIGHT TOTAL HIP ARTHROPLASTY ANTERIOR APPROACH;  Surgeon: Mcarthur Rossetti, MD;  Location: [place] [facility];  Service: Orthopedics;  Laterality: Right;        Home Medications    Prior to Admission medications   Medication Sig Start Date End Date Taking? Authorizing Provider  acetaminophen (TYLENOL) 500 MG tablet Take 500 mg by mouth every 6 (six) hours as needed for mild pain.    [provider]  albuterol (PROVENTIL) (2.5 MG/3ML) 0.083% nebulizer solution Take 2.5 mg by nebulization every 4 (four) hours as needed for shortness of breath. Reported on 09/25/2015     [provider]  atorvastatin (LIPITOR) 10 MG tablet TAKE 1 TABLET(10 MG) BY MOUTH DAILY 01/02/18   Debbrah Alar, NP  B Complex-C-Folic Acid (RENA-VITE RX) 1 MG TABS Take 1 tablet by mouth at bedtime.  02/18/16   [provider]  colchicine 0.6 MG tablet Take 1/2 tablet by mouth once as needed for gout flare. Call if no improvement 01/02/18   Debbrah Alar, NP  fluticasone [facility]) 50 MCG/ACT nasal spray INSTILL 2 SPRAYS INTO BOTH NOSTRILS EVERY DAY 01/02/18   Debbrah Alar, NP  furosemide (LASIX) 40 MG tablet TAKE 1 TABLET(40 MG) BY MOUTH TWICE DAILY 10/03/17   Debbrah Alar, NP  gabapentin (NEURONTIN) 300 MG capsule Take 1 capsule (300 mg total) by mouth 3 (three) times daily. 07/03/18   Debbrah Alar, NP  hydroxychloroquine (PLAQUENIL) 200 MG tablet TAKE 1 TABLET(200 MG) BY MOUTH DAILY 01/23/17   Panwala, Naitik, PA-C  lidocaine (LIDODERM) 5 % Place  1 patch onto the skin daily. Remove & Discard patch within 12 hours or as directed by MD 07/03/18   Debbrah Alar, NP  ondansetron (ZOFRAN ODT) 4 MG disintegrating tablet Take 1 tablet (4 mg total) by mouth every 8 (eight) hours as needed. 07/05/18   Isla Pence, MD  RENVELA 800 MG tablet Take 3 tablets (2,400 mg total) by mouth 3 (three) times daily with meals. 07/23/16   Lavina Hamman, MD  warfarin (COUMADIN) 7.5 MG tablet TAKE 1 TABLET BY MOUTH DAILY AS DIRECTED BY COUMADIN CLINIC 05/30/18   Debbrah Alar, NP    Family History Family History  Problem Relation Age of Onset  . Hypertension Mother   . Arthritis Mother        ?RA  . Hypertension Father     Social History Social History   Tobacco Use  . Smoking status: Never Smoker  . Smokeless tobacco: Never Used  Substance Use Topics  . Alcohol use: No    Comment: occasional  . Drug use: No     Allergies   Ace inhibitors   Review of Systems Review of Systems  Gastrointestinal: Positive for abdominal pain, nausea and  vomiting.  All other systems reviewed and are negative.    Physical Exam Updated Vital Signs BP (!) 103/51   Pulse 62   Resp (!) 22   Ht 6\' 3"  (1.905 m)   Wt 128.8 kg   SpO2 97%   BMI 35.50 kg/m   Physical Exam Vitals signs and nursing note reviewed.  Constitutional:      Appearance: He is well-developed.  HENT:     Head: Normocephalic and atraumatic.     Mouth/Throat:     Mouth: Mucous membranes are moist.     Pharynx: Oropharynx is clear.  Eyes:     Extraocular Movements: Extraocular movements intact.     Pupils: Pupils are equal, round, and reactive to light.  Neck:     Musculoskeletal: Normal range of motion.  Cardiovascular:     Rate and Rhythm: Regular rhythm. Bradycardia present.     Heart sounds: Normal heart sounds.  Pulmonary:     Effort: Pulmonary effort is normal.     Breath sounds: Normal breath sounds.  Abdominal:     General: Abdomen is flat.     Palpations: Abdomen is soft.     Tenderness: There is abdominal tenderness in the epigastric area.  Musculoskeletal: Normal range of motion.  Skin:    General: Skin is warm.     Comments: Healing shingle rash to right hip  Neurological:     General: No focal deficit present.     Mental Status: He is alert and oriented to person, place, and time. Mental status is at baseline.  Psychiatric:        Mood and Affect: Mood normal.        Behavior: Behavior normal.        Thought Content: Thought content normal.        Judgment: Judgment normal.      ED Treatments / Results  Labs (all labs ordered are listed, but only abnormal results are displayed) Labs Reviewed  CBC WITH DIFFERENTIAL/PLATELET - Abnormal; Notable for the following components:      Result Value   WBC 11.3 (*)    RBC 3.15 (*)    Hemoglobin 9.8 (*)    HCT 32.9 (*)    MCV 104.4 (*)    MCHC 29.8 (*)    RDW  16.4 (*)    Platelets 121 (*)    Neutro Abs 10.0 (*)    Lymphs Abs 0.5 (*)    All other components within normal limits    COMPREHENSIVE METABOLIC PANEL - Abnormal; Notable for the following components:   Glucose, Bld 126 (*)    BUN 45 (*)    Creatinine, Ser 7.31 (*)    Alkaline Phosphatase 173 (*)    GFR calc non Af Amer 7 (*)    GFR calc Af Amer 8 (*)    All other components within normal limits  LIPASE, BLOOD - Abnormal; Notable for the following components:   Lipase 68 (*)    All other components within normal limits  TROPONIN I - Abnormal; Notable for the following components:   Troponin I 0.10 (*)    All other components within normal limits  PROTIME-INR - Abnormal; Notable for the following components:   Prothrombin Time 23.6 (*)    All other components within normal limits    EKG EKG Interpretation  Date/Time:  Thursday July 05 2018 15:36:50 EST Ventricular Rate:  52 PR Interval:    QRS Duration: 140 QT Interval:  517 QTC Calculation: 481 R Axis:   -72 Text Interpretation:  Junctional rhythm Nonspecific IVCD with LAD Anterolateral infarct, age indeterminate No significant change since last tracing Confirmed by Isla Pence [phone]) on 07/05/2018 3:42:02 PM   Radiology Ct Abdomen Pelvis Wo Contrast  Result Date: 07/05/2018 CLINICAL DATA:  Vomiting distension EXAM: CT ABDOMEN AND PELVIS WITHOUT CONTRAST TECHNIQUE: Multidetector CT imaging of the abdomen and pelvis was performed following the standard protocol without IV contrast. COMPARISON:  CT 05/01/2015 FINDINGS: Lower chest: Lung bases demonstrate no significant pleural effusion. Gynecomastia. Cardiomegaly. Hepatobiliary: No focal hepatic abnormality. Status post cholecystectomy. No biliary dilatation. Pancreas: No ductal dilatation. Similar appearance of soft tissue stranding and hazy edema around the pancreas and within the retroperitoneum. Spleen: Normal in size without focal abnormality. Adrenals/Urinary Tract: Right adrenal gland is normal. Stable left adrenal gland nodularity.Numerous cysts within the kidneys. Cortical  calcification upper pole left kidney. Slightly complex/hyperdense renal cortical lesions more apparent on the left side. 3.6 cm slightly dense exophytic lesion posterior mid kidney, either increased or new since prior. Bladder is empty. Stomach/Bowel: No dilated small bowel. No colon wall thickening. Moderate fluid distention of the stomach. Vascular/Lymphatic: Dilated IVC. Nonaneurysmal aorta. Moderate aortic atherosclerosis. Heavily calcified SMA. Scattered subcentimeter lymph nodes within the mesentery. Reproductive: Prostate is unremarkable.  Prostate calcification Other: No free air or free fluid. Similar appearance of moderate perinephric fat stranding and hazy edema/soft tissue stranding within the retroperitoneum. Musculoskeletal: Bilateral hip replacements. Diffuse degenerative changes of the spine with fused appearance of the lower lumbar spine. Postsurgical changes with prior laminectomy at T10 and T11. fatty replacement/atrophy of left iliopsoas muscle. IMPRESSION: 1. Negative for bowel obstruction, bowel wall thickening or free air 2. Similar appearance of moderate edema and soft tissue stranding within the retroperitoneum, chronic finding. 3. Multiple renal cysts. Indeterminate left posterior midpole lesion measuring 3.6 cm, appears new or increased since 2016. Could further evaluate with contrast-enhanced examination when clinically feasible. 4. Cardiomegaly Electronically Signed   By: Donavan Foil M.D.   On: 07/05/2018 18:16    Procedures Procedures (including critical care time)  Medications Ordered in ED Medications  ondansetron (ZOFRAN) injection 4 mg (4 mg Intravenous Given 07/05/18 1606)  morphine 4 MG/ML injection 4 mg (4 mg Intravenous Given 07/05/18 1606)  sodium chloride 0.9 % bolus 500 mL (0  mLs Intravenous Stopped 07/05/18 1716)  sodium chloride 0.9 % bolus 500 mL ( Intravenous Stopped 07/05/18 1938)  promethazine (PHENERGAN) injection 12.5 mg (12.5 mg Intravenous Given  07/05/18 1840)     Initial Impression / Assessment and Plan / ED Course  I have reviewed the triage vital signs and the nursing notes.  Pertinent labs & imaging results that were available during my care of the patient were reviewed by me and considered in my medical decision making (see chart for details).    Pt is feeling much better.  He is tolerating fluids and is ready to go home.  He will be d/c home with a rx for zofran.  He is going to dialysis in the morning.  He knows to return if worse.  Final Clinical Impressions(s) / ED Diagnoses   Final diagnoses:  ESRD on hemodialysis ([place])  Generalized abdominal pain  Non-intractable vomiting with nausea, unspecified vomiting type    ED Discharge Orders         Ordered    ondansetron (ZOFRAN ODT) 4 MG disintegrating tablet  Every 8 hours PRN     07/05/18 2250           Isla Pence, MD 07/05/18 2251

## 2018-07-05 NOTE — ED Notes (Signed)
EDP aware of pt's V/S. Pt states he normally has a low HR and B/P. Pt denies symptoms of CP or lightheadedness. Pt remains A/Ox4.

## 2018-07-06 ENCOUNTER — Telehealth: Payer: Self-pay | Admitting: *Deleted

## 2018-07-06 DIAGNOSIS — N186 End stage renal disease: Secondary | ICD-10-CM | POA: Diagnosis not present

## 2018-07-06 DIAGNOSIS — Z992 Dependence on renal dialysis: Secondary | ICD-10-CM | POA: Diagnosis not present

## 2018-07-06 DIAGNOSIS — D631 Anemia in chronic kidney disease: Secondary | ICD-10-CM | POA: Diagnosis not present

## 2018-07-06 DIAGNOSIS — N2581 Secondary hyperparathyroidism of renal origin: Secondary | ICD-10-CM | POA: Diagnosis not present

## 2018-07-06 DIAGNOSIS — I5042 Chronic combined systolic (congestive) and diastolic (congestive) heart failure: Secondary | ICD-10-CM | POA: Diagnosis not present

## 2018-07-06 DIAGNOSIS — I4891 Unspecified atrial fibrillation: Secondary | ICD-10-CM | POA: Diagnosis not present

## 2018-07-06 NOTE — Telephone Encounter (Signed)
Received Lab Report results from [facility]; forwarded to provider/SLS 12/20

## 2018-07-07 DIAGNOSIS — I5042 Chronic combined systolic (congestive) and diastolic (congestive) heart failure: Secondary | ICD-10-CM | POA: Diagnosis not present

## 2018-07-07 DIAGNOSIS — N186 End stage renal disease: Secondary | ICD-10-CM | POA: Diagnosis not present

## 2018-07-07 DIAGNOSIS — D631 Anemia in chronic kidney disease: Secondary | ICD-10-CM | POA: Diagnosis not present

## 2018-07-07 DIAGNOSIS — Z992 Dependence on renal dialysis: Secondary | ICD-10-CM | POA: Diagnosis not present

## 2018-07-07 DIAGNOSIS — N2581 Secondary hyperparathyroidism of renal origin: Secondary | ICD-10-CM | POA: Diagnosis not present

## 2018-07-07 DIAGNOSIS — I4891 Unspecified atrial fibrillation: Secondary | ICD-10-CM | POA: Diagnosis not present

## 2018-07-09 DIAGNOSIS — N2581 Secondary hyperparathyroidism of renal origin: Secondary | ICD-10-CM | POA: Diagnosis not present

## 2018-07-09 DIAGNOSIS — N186 End stage renal disease: Secondary | ICD-10-CM | POA: Diagnosis not present

## 2018-07-09 DIAGNOSIS — I5042 Chronic combined systolic (congestive) and diastolic (congestive) heart failure: Secondary | ICD-10-CM | POA: Diagnosis not present

## 2018-07-09 DIAGNOSIS — Z992 Dependence on renal dialysis: Secondary | ICD-10-CM | POA: Diagnosis not present

## 2018-07-09 DIAGNOSIS — D631 Anemia in chronic kidney disease: Secondary | ICD-10-CM | POA: Diagnosis not present

## 2018-07-09 DIAGNOSIS — I4891 Unspecified atrial fibrillation: Secondary | ICD-10-CM | POA: Diagnosis not present

## 2018-07-09 IMAGING — CR DG HIP (WITH OR WITHOUT PELVIS) 1V PORT*R*
2 series · 2 of 2 positions shown · non-contrast
Comparison: Intraoperative images from 0909 hours today. CT Abdomen
and Pelvis 05/01/2015.

CLINICAL DATA: 74-year-old male status post right hip arthroplasty.
Initial encounter.

EXAM:
DG HIP (WITH OR WITHOUT PELVIS) 1V PORT RIGHT

[[person_name] view]
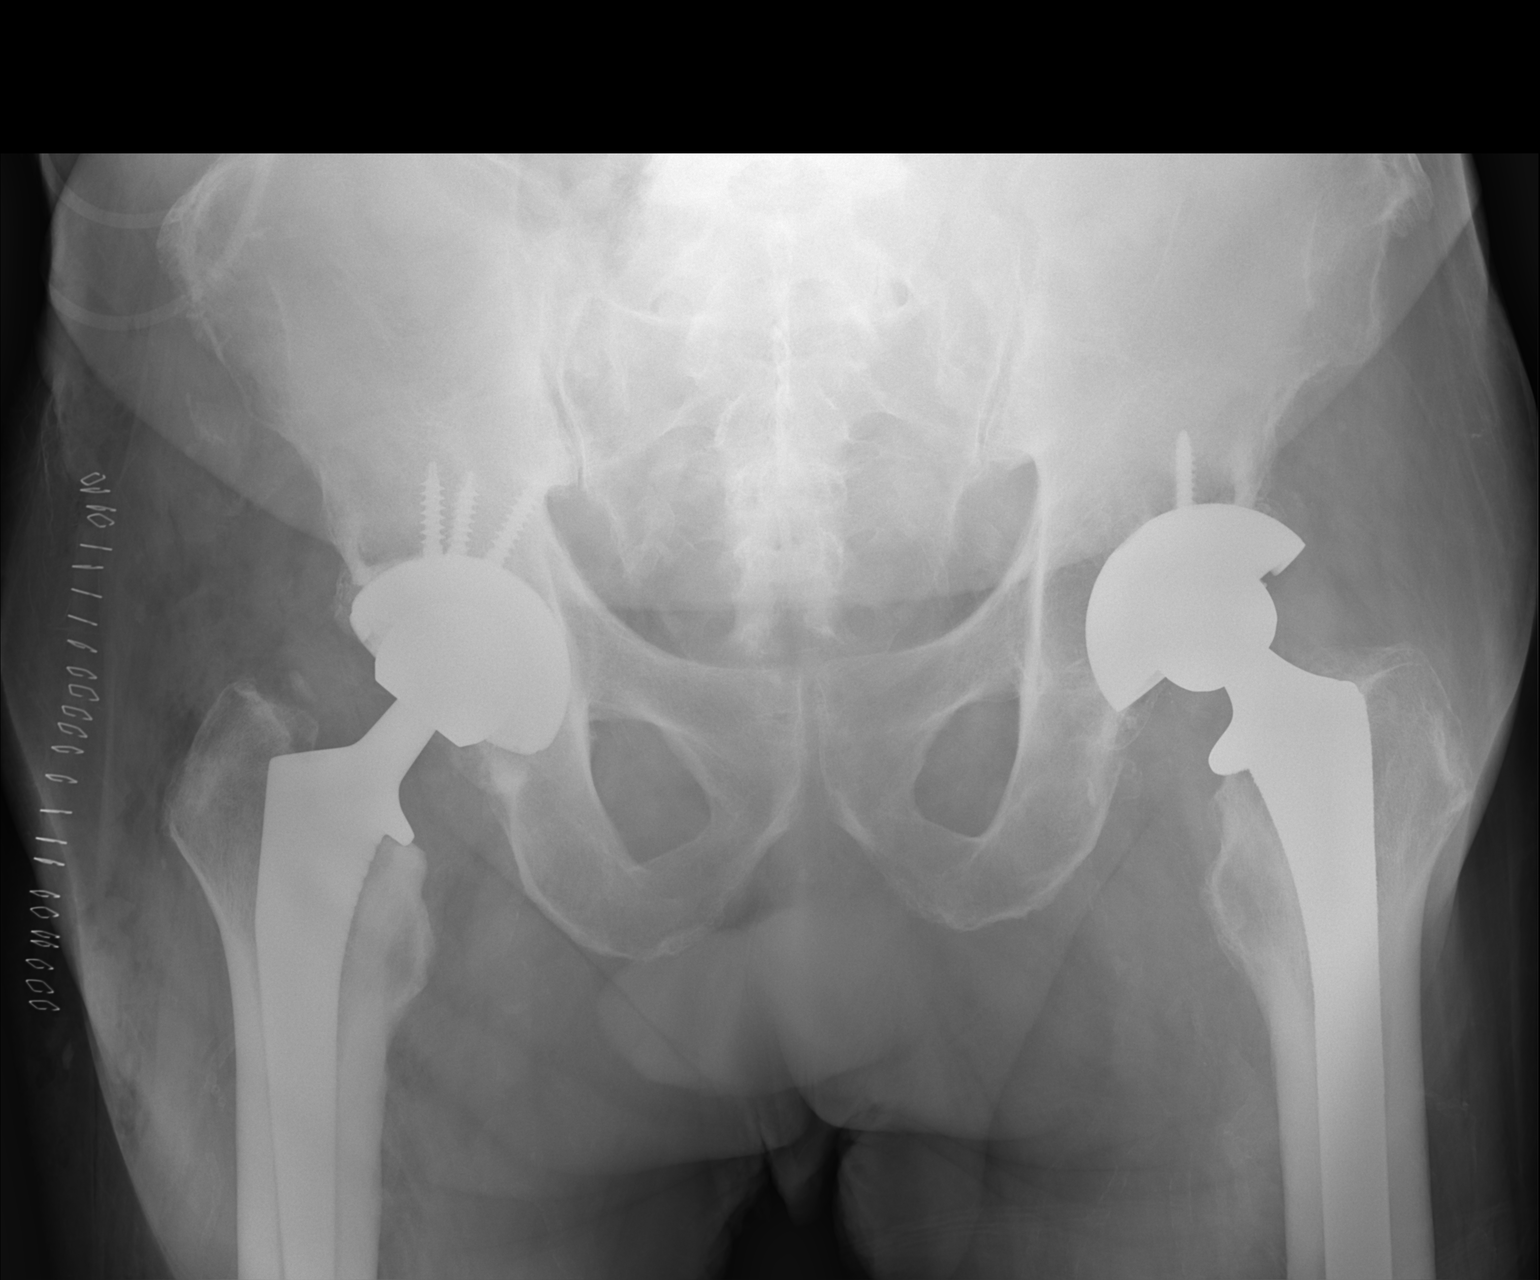

[xtable lateral]
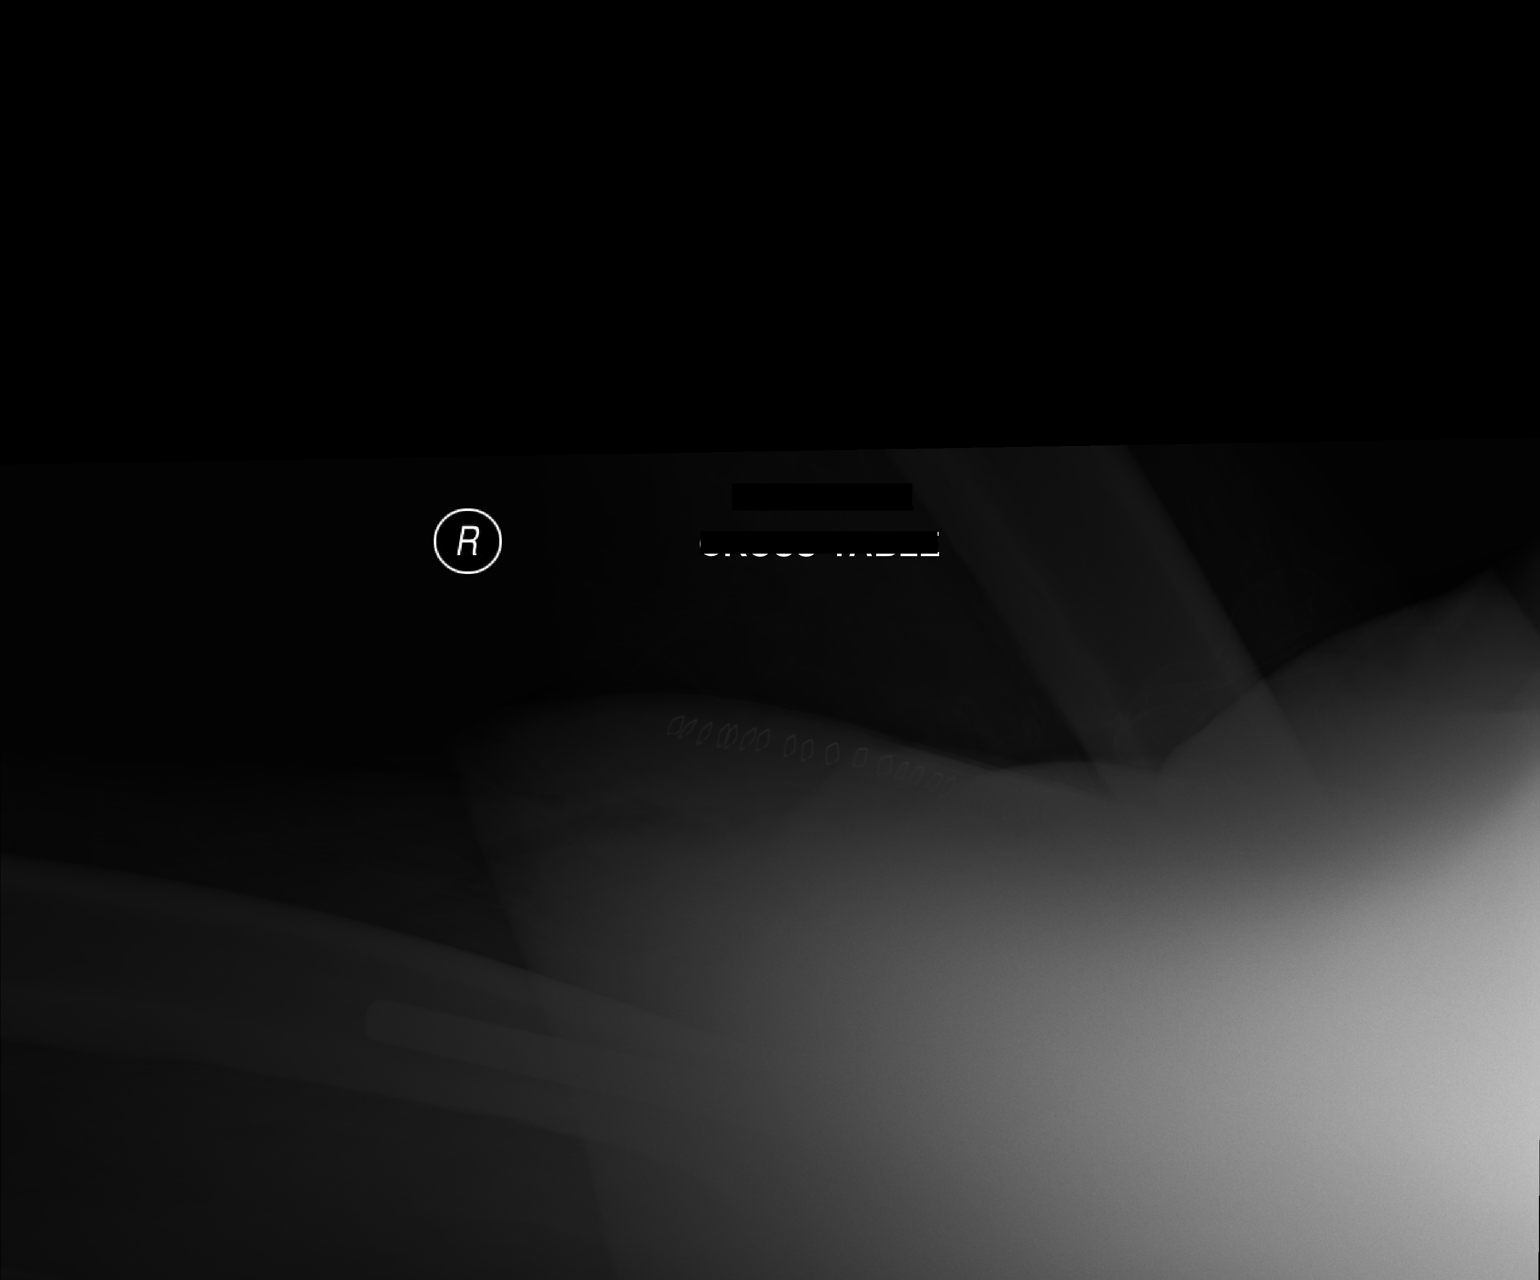

[2 of 2 positions shown; findings below may reference images not displayed]

FINDINGS: Portable supine AP and cross-table lateral views. The cross-table
lateral view is inadequately penetrated to visualize all of the
right hip arthroplasty hardware.

On the AP view new right total hip arthroplasty hardware is in place
and appears normally aligned. Visible hardware is intact.
Preexisting left total hip arthroplasty hardware. Postoperative
changes to the soft tissue surrounding the right hip including
subcutaneous gas and skin staples. No unexpected osseous changes
identified.
IMPRESSION: Inadequately penetrated cross-table lateral view. Negative AP
radiographic appearance of right total hip arthroplasty.

## 2018-07-10 DIAGNOSIS — Z992 Dependence on renal dialysis: Secondary | ICD-10-CM | POA: Diagnosis not present

## 2018-07-10 DIAGNOSIS — D631 Anemia in chronic kidney disease: Secondary | ICD-10-CM | POA: Diagnosis not present

## 2018-07-10 DIAGNOSIS — N2581 Secondary hyperparathyroidism of renal origin: Secondary | ICD-10-CM | POA: Diagnosis not present

## 2018-07-10 DIAGNOSIS — I5042 Chronic combined systolic (congestive) and diastolic (congestive) heart failure: Secondary | ICD-10-CM | POA: Diagnosis not present

## 2018-07-10 DIAGNOSIS — I4891 Unspecified atrial fibrillation: Secondary | ICD-10-CM | POA: Diagnosis not present

## 2018-07-10 DIAGNOSIS — N186 End stage renal disease: Secondary | ICD-10-CM | POA: Diagnosis not present

## 2018-07-13 DIAGNOSIS — N2581 Secondary hyperparathyroidism of renal origin: Secondary | ICD-10-CM | POA: Diagnosis not present

## 2018-07-13 DIAGNOSIS — Z992 Dependence on renal dialysis: Secondary | ICD-10-CM | POA: Diagnosis not present

## 2018-07-13 DIAGNOSIS — I5042 Chronic combined systolic (congestive) and diastolic (congestive) heart failure: Secondary | ICD-10-CM | POA: Diagnosis not present

## 2018-07-13 DIAGNOSIS — I4891 Unspecified atrial fibrillation: Secondary | ICD-10-CM | POA: Diagnosis not present

## 2018-07-13 DIAGNOSIS — N186 End stage renal disease: Secondary | ICD-10-CM | POA: Diagnosis not present

## 2018-07-13 DIAGNOSIS — D631 Anemia in chronic kidney disease: Secondary | ICD-10-CM | POA: Diagnosis not present

## 2018-07-15 DIAGNOSIS — Z992 Dependence on renal dialysis: Secondary | ICD-10-CM | POA: Diagnosis not present

## 2018-07-15 DIAGNOSIS — I4891 Unspecified atrial fibrillation: Secondary | ICD-10-CM | POA: Diagnosis not present

## 2018-07-15 DIAGNOSIS — I5042 Chronic combined systolic (congestive) and diastolic (congestive) heart failure: Secondary | ICD-10-CM | POA: Diagnosis not present

## 2018-07-15 DIAGNOSIS — D631 Anemia in chronic kidney disease: Secondary | ICD-10-CM | POA: Diagnosis not present

## 2018-07-15 DIAGNOSIS — N186 End stage renal disease: Secondary | ICD-10-CM | POA: Diagnosis not present

## 2018-07-15 DIAGNOSIS — N2581 Secondary hyperparathyroidism of renal origin: Secondary | ICD-10-CM | POA: Diagnosis not present

## 2018-07-17 ENCOUNTER — Ambulatory Visit: Payer: Medicare Other | Admitting: Family

## 2018-07-17 DIAGNOSIS — I4891 Unspecified atrial fibrillation: Secondary | ICD-10-CM | POA: Diagnosis not present

## 2018-07-17 DIAGNOSIS — N186 End stage renal disease: Secondary | ICD-10-CM | POA: Diagnosis not present

## 2018-07-17 DIAGNOSIS — N2581 Secondary hyperparathyroidism of renal origin: Secondary | ICD-10-CM | POA: Diagnosis not present

## 2018-07-17 DIAGNOSIS — I5042 Chronic combined systolic (congestive) and diastolic (congestive) heart failure: Secondary | ICD-10-CM | POA: Diagnosis not present

## 2018-07-17 DIAGNOSIS — D631 Anemia in chronic kidney disease: Secondary | ICD-10-CM | POA: Diagnosis not present

## 2018-07-17 DIAGNOSIS — Z0289 Encounter for other administrative examinations: Secondary | ICD-10-CM

## 2018-07-17 DIAGNOSIS — Z992 Dependence on renal dialysis: Secondary | ICD-10-CM | POA: Diagnosis not present

## 2018-07-18 DIAGNOSIS — I158 Other secondary hypertension: Secondary | ICD-10-CM | POA: Diagnosis not present

## 2018-07-18 DIAGNOSIS — N186 End stage renal disease: Secondary | ICD-10-CM | POA: Diagnosis not present

## 2018-07-18 DIAGNOSIS — Z992 Dependence on renal dialysis: Secondary | ICD-10-CM | POA: Diagnosis not present

## 2018-07-20 DIAGNOSIS — D509 Iron deficiency anemia, unspecified: Secondary | ICD-10-CM | POA: Diagnosis not present

## 2018-07-20 DIAGNOSIS — D631 Anemia in chronic kidney disease: Secondary | ICD-10-CM | POA: Diagnosis not present

## 2018-07-20 DIAGNOSIS — E8779 Other fluid overload: Secondary | ICD-10-CM | POA: Diagnosis not present

## 2018-07-20 DIAGNOSIS — N186 End stage renal disease: Secondary | ICD-10-CM | POA: Diagnosis not present

## 2018-07-20 DIAGNOSIS — I132 Hypertensive heart and chronic kidney disease with heart failure and with stage 5 chronic kidney disease, or end stage renal disease: Secondary | ICD-10-CM | POA: Diagnosis not present

## 2018-07-20 DIAGNOSIS — Z992 Dependence on renal dialysis: Secondary | ICD-10-CM | POA: Diagnosis not present

## 2018-07-20 DIAGNOSIS — N2581 Secondary hyperparathyroidism of renal origin: Secondary | ICD-10-CM | POA: Diagnosis not present

## 2018-07-20 DIAGNOSIS — I5042 Chronic combined systolic (congestive) and diastolic (congestive) heart failure: Secondary | ICD-10-CM | POA: Diagnosis not present

## 2018-07-20 DIAGNOSIS — I4891 Unspecified atrial fibrillation: Secondary | ICD-10-CM | POA: Diagnosis not present

## 2018-07-21 DIAGNOSIS — N186 End stage renal disease: Secondary | ICD-10-CM | POA: Diagnosis not present

## 2018-07-21 DIAGNOSIS — I4891 Unspecified atrial fibrillation: Secondary | ICD-10-CM | POA: Diagnosis not present

## 2018-07-21 DIAGNOSIS — D509 Iron deficiency anemia, unspecified: Secondary | ICD-10-CM | POA: Diagnosis not present

## 2018-07-21 DIAGNOSIS — N2581 Secondary hyperparathyroidism of renal origin: Secondary | ICD-10-CM | POA: Diagnosis not present

## 2018-07-21 DIAGNOSIS — D631 Anemia in chronic kidney disease: Secondary | ICD-10-CM | POA: Diagnosis not present

## 2018-07-21 DIAGNOSIS — Z992 Dependence on renal dialysis: Secondary | ICD-10-CM | POA: Diagnosis not present

## 2018-07-23 DIAGNOSIS — D509 Iron deficiency anemia, unspecified: Secondary | ICD-10-CM | POA: Diagnosis not present

## 2018-07-23 DIAGNOSIS — N186 End stage renal disease: Secondary | ICD-10-CM | POA: Diagnosis not present

## 2018-07-23 DIAGNOSIS — N2581 Secondary hyperparathyroidism of renal origin: Secondary | ICD-10-CM | POA: Diagnosis not present

## 2018-07-23 DIAGNOSIS — Z992 Dependence on renal dialysis: Secondary | ICD-10-CM | POA: Diagnosis not present

## 2018-07-23 DIAGNOSIS — I4891 Unspecified atrial fibrillation: Secondary | ICD-10-CM | POA: Diagnosis not present

## 2018-07-23 DIAGNOSIS — D631 Anemia in chronic kidney disease: Secondary | ICD-10-CM | POA: Diagnosis not present

## 2018-07-24 DIAGNOSIS — L608 Other nail disorders: Secondary | ICD-10-CM | POA: Diagnosis not present

## 2018-07-24 DIAGNOSIS — L988 Other specified disorders of the skin and subcutaneous tissue: Secondary | ICD-10-CM | POA: Diagnosis not present

## 2018-07-25 ENCOUNTER — Other Ambulatory Visit: Payer: Self-pay | Admitting: Family

## 2018-07-25 DIAGNOSIS — Z992 Dependence on renal dialysis: Secondary | ICD-10-CM | POA: Diagnosis not present

## 2018-07-25 DIAGNOSIS — D631 Anemia in chronic kidney disease: Secondary | ICD-10-CM | POA: Diagnosis not present

## 2018-07-25 DIAGNOSIS — N186 End stage renal disease: Secondary | ICD-10-CM | POA: Diagnosis not present

## 2018-07-25 DIAGNOSIS — N2581 Secondary hyperparathyroidism of renal origin: Secondary | ICD-10-CM | POA: Diagnosis not present

## 2018-07-25 DIAGNOSIS — D509 Iron deficiency anemia, unspecified: Secondary | ICD-10-CM | POA: Diagnosis not present

## 2018-07-25 DIAGNOSIS — I4891 Unspecified atrial fibrillation: Secondary | ICD-10-CM | POA: Diagnosis not present

## 2018-07-27 DIAGNOSIS — Z992 Dependence on renal dialysis: Secondary | ICD-10-CM | POA: Diagnosis not present

## 2018-07-27 DIAGNOSIS — N2581 Secondary hyperparathyroidism of renal origin: Secondary | ICD-10-CM | POA: Diagnosis not present

## 2018-07-27 DIAGNOSIS — I4891 Unspecified atrial fibrillation: Secondary | ICD-10-CM | POA: Diagnosis not present

## 2018-07-27 DIAGNOSIS — D631 Anemia in chronic kidney disease: Secondary | ICD-10-CM | POA: Diagnosis not present

## 2018-07-27 DIAGNOSIS — N186 End stage renal disease: Secondary | ICD-10-CM | POA: Diagnosis not present

## 2018-07-27 DIAGNOSIS — D509 Iron deficiency anemia, unspecified: Secondary | ICD-10-CM | POA: Diagnosis not present

## 2018-07-28 DIAGNOSIS — D509 Iron deficiency anemia, unspecified: Secondary | ICD-10-CM | POA: Diagnosis not present

## 2018-07-28 DIAGNOSIS — Z992 Dependence on renal dialysis: Secondary | ICD-10-CM | POA: Diagnosis not present

## 2018-07-28 DIAGNOSIS — N2581 Secondary hyperparathyroidism of renal origin: Secondary | ICD-10-CM | POA: Diagnosis not present

## 2018-07-28 DIAGNOSIS — I4891 Unspecified atrial fibrillation: Secondary | ICD-10-CM | POA: Diagnosis not present

## 2018-07-28 DIAGNOSIS — D631 Anemia in chronic kidney disease: Secondary | ICD-10-CM | POA: Diagnosis not present

## 2018-07-28 DIAGNOSIS — N186 End stage renal disease: Secondary | ICD-10-CM | POA: Diagnosis not present

## 2018-07-30 DIAGNOSIS — N186 End stage renal disease: Secondary | ICD-10-CM | POA: Diagnosis not present

## 2018-07-30 DIAGNOSIS — N2581 Secondary hyperparathyroidism of renal origin: Secondary | ICD-10-CM | POA: Diagnosis not present

## 2018-07-30 DIAGNOSIS — D509 Iron deficiency anemia, unspecified: Secondary | ICD-10-CM | POA: Diagnosis not present

## 2018-07-30 DIAGNOSIS — Z992 Dependence on renal dialysis: Secondary | ICD-10-CM | POA: Diagnosis not present

## 2018-07-30 DIAGNOSIS — I4891 Unspecified atrial fibrillation: Secondary | ICD-10-CM | POA: Diagnosis not present

## 2018-07-30 DIAGNOSIS — D631 Anemia in chronic kidney disease: Secondary | ICD-10-CM | POA: Diagnosis not present

## 2018-08-01 DIAGNOSIS — N186 End stage renal disease: Secondary | ICD-10-CM | POA: Diagnosis not present

## 2018-08-01 DIAGNOSIS — D509 Iron deficiency anemia, unspecified: Secondary | ICD-10-CM | POA: Diagnosis not present

## 2018-08-01 DIAGNOSIS — D631 Anemia in chronic kidney disease: Secondary | ICD-10-CM | POA: Diagnosis not present

## 2018-08-01 DIAGNOSIS — I4891 Unspecified atrial fibrillation: Secondary | ICD-10-CM | POA: Diagnosis not present

## 2018-08-01 DIAGNOSIS — Z992 Dependence on renal dialysis: Secondary | ICD-10-CM | POA: Diagnosis not present

## 2018-08-01 DIAGNOSIS — N2581 Secondary hyperparathyroidism of renal origin: Secondary | ICD-10-CM | POA: Diagnosis not present

## 2018-08-02 LAB — PROTIME-INR: INR: 1.9 — AB (ref 0.9–1.1)

## 2018-08-03 DIAGNOSIS — D509 Iron deficiency anemia, unspecified: Secondary | ICD-10-CM | POA: Diagnosis not present

## 2018-08-03 DIAGNOSIS — D631 Anemia in chronic kidney disease: Secondary | ICD-10-CM | POA: Diagnosis not present

## 2018-08-03 DIAGNOSIS — Z992 Dependence on renal dialysis: Secondary | ICD-10-CM | POA: Diagnosis not present

## 2018-08-03 DIAGNOSIS — N186 End stage renal disease: Secondary | ICD-10-CM | POA: Diagnosis not present

## 2018-08-03 DIAGNOSIS — I4891 Unspecified atrial fibrillation: Secondary | ICD-10-CM | POA: Diagnosis not present

## 2018-08-03 DIAGNOSIS — N2581 Secondary hyperparathyroidism of renal origin: Secondary | ICD-10-CM | POA: Diagnosis not present

## 2018-08-04 DIAGNOSIS — Z992 Dependence on renal dialysis: Secondary | ICD-10-CM | POA: Diagnosis not present

## 2018-08-04 DIAGNOSIS — D509 Iron deficiency anemia, unspecified: Secondary | ICD-10-CM | POA: Diagnosis not present

## 2018-08-04 DIAGNOSIS — I4891 Unspecified atrial fibrillation: Secondary | ICD-10-CM | POA: Diagnosis not present

## 2018-08-04 DIAGNOSIS — N186 End stage renal disease: Secondary | ICD-10-CM | POA: Diagnosis not present

## 2018-08-04 DIAGNOSIS — N2581 Secondary hyperparathyroidism of renal origin: Secondary | ICD-10-CM | POA: Diagnosis not present

## 2018-08-04 DIAGNOSIS — D631 Anemia in chronic kidney disease: Secondary | ICD-10-CM | POA: Diagnosis not present

## 2018-08-06 DIAGNOSIS — D631 Anemia in chronic kidney disease: Secondary | ICD-10-CM | POA: Diagnosis not present

## 2018-08-06 DIAGNOSIS — I4891 Unspecified atrial fibrillation: Secondary | ICD-10-CM | POA: Diagnosis not present

## 2018-08-06 DIAGNOSIS — Z992 Dependence on renal dialysis: Secondary | ICD-10-CM | POA: Diagnosis not present

## 2018-08-06 DIAGNOSIS — N2581 Secondary hyperparathyroidism of renal origin: Secondary | ICD-10-CM | POA: Diagnosis not present

## 2018-08-06 DIAGNOSIS — D509 Iron deficiency anemia, unspecified: Secondary | ICD-10-CM | POA: Diagnosis not present

## 2018-08-06 DIAGNOSIS — N186 End stage renal disease: Secondary | ICD-10-CM | POA: Diagnosis not present

## 2018-08-07 ENCOUNTER — Telehealth: Payer: Self-pay | Admitting: *Deleted

## 2018-08-07 NOTE — Telephone Encounter (Signed)
Received PT/INR results from [facility]; forwarded to provider/SLS 01/21

## 2018-08-08 DIAGNOSIS — N2581 Secondary hyperparathyroidism of renal origin: Secondary | ICD-10-CM | POA: Diagnosis not present

## 2018-08-08 DIAGNOSIS — N186 End stage renal disease: Secondary | ICD-10-CM | POA: Diagnosis not present

## 2018-08-08 DIAGNOSIS — Z992 Dependence on renal dialysis: Secondary | ICD-10-CM | POA: Diagnosis not present

## 2018-08-08 DIAGNOSIS — I4891 Unspecified atrial fibrillation: Secondary | ICD-10-CM | POA: Diagnosis not present

## 2018-08-08 DIAGNOSIS — D631 Anemia in chronic kidney disease: Secondary | ICD-10-CM | POA: Diagnosis not present

## 2018-08-08 DIAGNOSIS — D509 Iron deficiency anemia, unspecified: Secondary | ICD-10-CM | POA: Diagnosis not present

## 2018-08-10 DIAGNOSIS — Z992 Dependence on renal dialysis: Secondary | ICD-10-CM | POA: Diagnosis not present

## 2018-08-10 DIAGNOSIS — N186 End stage renal disease: Secondary | ICD-10-CM | POA: Diagnosis not present

## 2018-08-10 DIAGNOSIS — D631 Anemia in chronic kidney disease: Secondary | ICD-10-CM | POA: Diagnosis not present

## 2018-08-10 DIAGNOSIS — I4891 Unspecified atrial fibrillation: Secondary | ICD-10-CM | POA: Diagnosis not present

## 2018-08-10 DIAGNOSIS — N2581 Secondary hyperparathyroidism of renal origin: Secondary | ICD-10-CM | POA: Diagnosis not present

## 2018-08-10 DIAGNOSIS — D509 Iron deficiency anemia, unspecified: Secondary | ICD-10-CM | POA: Diagnosis not present

## 2018-08-11 DIAGNOSIS — N186 End stage renal disease: Secondary | ICD-10-CM | POA: Diagnosis not present

## 2018-08-11 DIAGNOSIS — N2581 Secondary hyperparathyroidism of renal origin: Secondary | ICD-10-CM | POA: Diagnosis not present

## 2018-08-11 DIAGNOSIS — I4891 Unspecified atrial fibrillation: Secondary | ICD-10-CM | POA: Diagnosis not present

## 2018-08-11 DIAGNOSIS — D509 Iron deficiency anemia, unspecified: Secondary | ICD-10-CM | POA: Diagnosis not present

## 2018-08-11 DIAGNOSIS — Z992 Dependence on renal dialysis: Secondary | ICD-10-CM | POA: Diagnosis not present

## 2018-08-11 DIAGNOSIS — D631 Anemia in chronic kidney disease: Secondary | ICD-10-CM | POA: Diagnosis not present

## 2018-08-13 DIAGNOSIS — Z992 Dependence on renal dialysis: Secondary | ICD-10-CM | POA: Diagnosis not present

## 2018-08-13 DIAGNOSIS — N2581 Secondary hyperparathyroidism of renal origin: Secondary | ICD-10-CM | POA: Diagnosis not present

## 2018-08-13 DIAGNOSIS — N186 End stage renal disease: Secondary | ICD-10-CM | POA: Diagnosis not present

## 2018-08-13 DIAGNOSIS — D631 Anemia in chronic kidney disease: Secondary | ICD-10-CM | POA: Diagnosis not present

## 2018-08-13 DIAGNOSIS — D509 Iron deficiency anemia, unspecified: Secondary | ICD-10-CM | POA: Diagnosis not present

## 2018-08-13 DIAGNOSIS — I4891 Unspecified atrial fibrillation: Secondary | ICD-10-CM | POA: Diagnosis not present

## 2018-08-15 ENCOUNTER — Telehealth: Payer: Self-pay | Admitting: Family

## 2018-08-15 DIAGNOSIS — D631 Anemia in chronic kidney disease: Secondary | ICD-10-CM | POA: Diagnosis not present

## 2018-08-15 DIAGNOSIS — D509 Iron deficiency anemia, unspecified: Secondary | ICD-10-CM | POA: Diagnosis not present

## 2018-08-15 DIAGNOSIS — N186 End stage renal disease: Secondary | ICD-10-CM | POA: Diagnosis not present

## 2018-08-15 DIAGNOSIS — Z992 Dependence on renal dialysis: Secondary | ICD-10-CM | POA: Diagnosis not present

## 2018-08-15 DIAGNOSIS — I4891 Unspecified atrial fibrillation: Secondary | ICD-10-CM | POA: Diagnosis not present

## 2018-08-15 DIAGNOSIS — N2581 Secondary hyperparathyroidism of renal origin: Secondary | ICD-10-CM | POA: Diagnosis not present

## 2018-08-15 NOTE — Telephone Encounter (Signed)
Lm for patient to call back about this

## 2018-08-15 NOTE — Telephone Encounter (Signed)
Please contact pt and let him know that I received an INR from his kidney doctor from 08/01/17.  INR was a little low at 1.9.  Did the kidney doctor adjust his INR?  If not, let's bring him in for a nurse visit for INR recheck in our office please.

## 2018-08-15 NOTE — Telephone Encounter (Signed)
Per patient medication was adjusted and they will continue to monitor INR.

## 2018-08-16 NOTE — Telephone Encounter (Signed)
Noted  

## 2018-08-17 DIAGNOSIS — N2581 Secondary hyperparathyroidism of renal origin: Secondary | ICD-10-CM | POA: Diagnosis not present

## 2018-08-17 DIAGNOSIS — D509 Iron deficiency anemia, unspecified: Secondary | ICD-10-CM | POA: Diagnosis not present

## 2018-08-17 DIAGNOSIS — I4891 Unspecified atrial fibrillation: Secondary | ICD-10-CM | POA: Diagnosis not present

## 2018-08-17 DIAGNOSIS — N186 End stage renal disease: Secondary | ICD-10-CM | POA: Diagnosis not present

## 2018-08-17 DIAGNOSIS — D631 Anemia in chronic kidney disease: Secondary | ICD-10-CM | POA: Diagnosis not present

## 2018-08-17 DIAGNOSIS — Z992 Dependence on renal dialysis: Secondary | ICD-10-CM | POA: Diagnosis not present

## 2018-08-18 DIAGNOSIS — D509 Iron deficiency anemia, unspecified: Secondary | ICD-10-CM | POA: Diagnosis not present

## 2018-08-18 DIAGNOSIS — D631 Anemia in chronic kidney disease: Secondary | ICD-10-CM | POA: Diagnosis not present

## 2018-08-18 DIAGNOSIS — I132 Hypertensive heart and chronic kidney disease with heart failure and with stage 5 chronic kidney disease, or end stage renal disease: Secondary | ICD-10-CM | POA: Diagnosis not present

## 2018-08-18 DIAGNOSIS — N2581 Secondary hyperparathyroidism of renal origin: Secondary | ICD-10-CM | POA: Diagnosis not present

## 2018-08-18 DIAGNOSIS — E8779 Other fluid overload: Secondary | ICD-10-CM | POA: Diagnosis not present

## 2018-08-18 DIAGNOSIS — Z992 Dependence on renal dialysis: Secondary | ICD-10-CM | POA: Diagnosis not present

## 2018-08-18 DIAGNOSIS — N186 End stage renal disease: Secondary | ICD-10-CM | POA: Diagnosis not present

## 2018-08-18 DIAGNOSIS — I4891 Unspecified atrial fibrillation: Secondary | ICD-10-CM | POA: Diagnosis not present

## 2018-08-18 DIAGNOSIS — I5042 Chronic combined systolic (congestive) and diastolic (congestive) heart failure: Secondary | ICD-10-CM | POA: Diagnosis not present

## 2018-08-18 DIAGNOSIS — I158 Other secondary hypertension: Secondary | ICD-10-CM | POA: Diagnosis not present

## 2018-08-20 DIAGNOSIS — D631 Anemia in chronic kidney disease: Secondary | ICD-10-CM | POA: Diagnosis not present

## 2018-08-20 DIAGNOSIS — N2581 Secondary hyperparathyroidism of renal origin: Secondary | ICD-10-CM | POA: Diagnosis not present

## 2018-08-20 DIAGNOSIS — Z992 Dependence on renal dialysis: Secondary | ICD-10-CM | POA: Diagnosis not present

## 2018-08-20 DIAGNOSIS — D509 Iron deficiency anemia, unspecified: Secondary | ICD-10-CM | POA: Diagnosis not present

## 2018-08-20 DIAGNOSIS — I4891 Unspecified atrial fibrillation: Secondary | ICD-10-CM | POA: Diagnosis not present

## 2018-08-20 DIAGNOSIS — N186 End stage renal disease: Secondary | ICD-10-CM | POA: Diagnosis not present

## 2018-08-22 DIAGNOSIS — D509 Iron deficiency anemia, unspecified: Secondary | ICD-10-CM | POA: Diagnosis not present

## 2018-08-22 DIAGNOSIS — I4891 Unspecified atrial fibrillation: Secondary | ICD-10-CM | POA: Diagnosis not present

## 2018-08-22 DIAGNOSIS — N186 End stage renal disease: Secondary | ICD-10-CM | POA: Diagnosis not present

## 2018-08-22 DIAGNOSIS — Z992 Dependence on renal dialysis: Secondary | ICD-10-CM | POA: Diagnosis not present

## 2018-08-22 DIAGNOSIS — N2581 Secondary hyperparathyroidism of renal origin: Secondary | ICD-10-CM | POA: Diagnosis not present

## 2018-08-22 DIAGNOSIS — D631 Anemia in chronic kidney disease: Secondary | ICD-10-CM | POA: Diagnosis not present

## 2018-08-24 DIAGNOSIS — Z992 Dependence on renal dialysis: Secondary | ICD-10-CM | POA: Diagnosis not present

## 2018-08-24 DIAGNOSIS — D631 Anemia in chronic kidney disease: Secondary | ICD-10-CM | POA: Diagnosis not present

## 2018-08-24 DIAGNOSIS — I4891 Unspecified atrial fibrillation: Secondary | ICD-10-CM | POA: Diagnosis not present

## 2018-08-24 DIAGNOSIS — N2581 Secondary hyperparathyroidism of renal origin: Secondary | ICD-10-CM | POA: Diagnosis not present

## 2018-08-24 DIAGNOSIS — N186 End stage renal disease: Secondary | ICD-10-CM | POA: Diagnosis not present

## 2018-08-24 DIAGNOSIS — D509 Iron deficiency anemia, unspecified: Secondary | ICD-10-CM | POA: Diagnosis not present

## 2018-08-25 DIAGNOSIS — N186 End stage renal disease: Secondary | ICD-10-CM | POA: Diagnosis not present

## 2018-08-25 DIAGNOSIS — Z992 Dependence on renal dialysis: Secondary | ICD-10-CM | POA: Diagnosis not present

## 2018-08-25 DIAGNOSIS — D631 Anemia in chronic kidney disease: Secondary | ICD-10-CM | POA: Diagnosis not present

## 2018-08-25 DIAGNOSIS — N2581 Secondary hyperparathyroidism of renal origin: Secondary | ICD-10-CM | POA: Diagnosis not present

## 2018-08-25 DIAGNOSIS — I4891 Unspecified atrial fibrillation: Secondary | ICD-10-CM | POA: Diagnosis not present

## 2018-08-25 DIAGNOSIS — D509 Iron deficiency anemia, unspecified: Secondary | ICD-10-CM | POA: Diagnosis not present

## 2018-08-27 ENCOUNTER — Encounter (INDEPENDENT_AMBULATORY_CARE_PROVIDER_SITE_OTHER): Payer: Self-pay | Admitting: Physician Assistant

## 2018-08-27 ENCOUNTER — Ambulatory Visit (INDEPENDENT_AMBULATORY_CARE_PROVIDER_SITE_OTHER): Payer: Medicare Other | Admitting: Physician Assistant

## 2018-08-27 DIAGNOSIS — N2581 Secondary hyperparathyroidism of renal origin: Secondary | ICD-10-CM | POA: Diagnosis not present

## 2018-08-27 DIAGNOSIS — I4891 Unspecified atrial fibrillation: Secondary | ICD-10-CM | POA: Diagnosis not present

## 2018-08-27 DIAGNOSIS — M25551 Pain in right hip: Secondary | ICD-10-CM

## 2018-08-27 DIAGNOSIS — N186 End stage renal disease: Secondary | ICD-10-CM | POA: Diagnosis not present

## 2018-08-27 DIAGNOSIS — D631 Anemia in chronic kidney disease: Secondary | ICD-10-CM | POA: Diagnosis not present

## 2018-08-27 DIAGNOSIS — D509 Iron deficiency anemia, unspecified: Secondary | ICD-10-CM | POA: Diagnosis not present

## 2018-08-27 DIAGNOSIS — Z992 Dependence on renal dialysis: Secondary | ICD-10-CM | POA: Diagnosis not present

## 2018-08-27 NOTE — Progress Notes (Signed)
Office Visit Note   Patient: Bruce Cole           Date of Birth: Dec 03, 1940           MRN: [id number] Visit Date: 08/27/2018              Requested by: Debbrah Alar, NP [address] [place], [place] [address] PCP: Debbrah Alar, NP   Assessment & Plan: Visit Diagnoses:  1. Pain in right hip     Plan: He is given 2 of Bactroban which he is to apply to his right buttocks stage II ulcer.  Also wash the area with antibacterial soap daily.  We will obtain an MRI of his right hip to rule out loosening.  Follow-Up Instructions: Return for After MRI.   Orders:  No orders of the defined types were placed in this encounter.  No orders of the defined types were placed in this encounter.     Procedures: No procedures performed   Clinical Data: No additional findings.   Subjective: Chief Complaint  Patient presents with  . Right Hip - Pain    HPI [name] 78 year old male who is well-known to Dr. Ninfa Linden service history of right total hip arthroplasty 03/22/2016 by Dr. Ninfa Linden.  He continues to have pain in the right hip.  He is concerned he may have an infection.  States for the last month he has had a nonhealing blister with some drainage which he describes as pus right buttocks region.  He is seen by dermatology and his primary care physician as he thought this may be herpes zoster he was told it was not.  Denies any fevers chills.  He has had a cough for the past 3 to 4 weeks.  Patient is a renal patient on dialysis. Radiographs of the right hip and AP pelvis performed on 06/12/2018 showed no evidence of loosening complicating features of either total hip.  No fracture.  No evidence of wear of either total hip arthroplasty.. Review of Systems Please see HPI otherwise negative  Objective: Vital Signs: There were no vitals taken for this visit.  Physical Exam Constitutional:      Appearance: He is not ill-appearing or diaphoretic.    Pulmonary:     Effort: Pulmonary effort is normal.  Neurological:     Mental Status: He is alert.     Ortho Exam Left hip good range of motion without pain.  Right hip he has pain with internal and external rotation of the hip but fluid motion.  Right buttocks he has a stage II ulcer approximately the size of a nickel.  No expressible purulence.  Minimal bleeding. Specialty Comments:  No specialty comments available.  Imaging: No results found.   PMFS History: Patient Active Problem List   Diagnosis Date Noted  . High risk medication use 03/17/2017  . Hyperkalemia 08/01/2016  . Aftercare following surgery of the circulatory system 06/17/2016  . Status post total replacement of right hip 03/22/2016  . ESRD on dialysis ([place]) 11/27/2015  . OSA (obstructive sleep apnea) 09/02/2013  . Chronic combined systolic and diastolic heart failure ([place]) 03/16/2013  . Allergic rhinitis 12/18/2012  . Long term current use of anticoagulant therapy 11/25/2010  . Genital herpes 05/31/2010  . DM (diabetes mellitus), type 2 with renal complications ([place]) 28/78/6767  . ERECTILE DYSFUNCTION, ORGANIC 09/21/2009  . Osteoarthritis 09/21/2009  . PERSONAL HX COLONIC POLYPS 08/25/2009  . Gout 07/22/2009  . Essential hypertension 07/22/2009  .  ATRIAL FIBRILLATION 07/22/2009  . Rheumatoid arthritis ([place]) 07/22/2009  . NEPHROLITHIASIS, HX OF 07/22/2009   Past Medical History:  Diagnosis Date  . Acute blood loss anemia   . Atrial fibrillation (HCC)    Chronic  . Chronic combined systolic and diastolic heart failure ([place])   . Colon polyp 2000  . Dysrhythmia    hx  . ESRD on hemodialysis ([place])    [place]  . Essential hypertension   . Gastritis and gastroduodenitis   . GI bleed   . Gout   . Heart murmur   . History of blood transfusion   . History of kidney stones   . Nephrolithiasis   . OSA (obstructive sleep apnea) 09/02/2013    IMPRESSION :  1. Mild obstructive sleep apnea  with hypopneas causing sleep fragmentation and moderate oxygen desaturation.  2. Short runs of nonsustained VT were noted. His beta blocker may need to be titrated 3. Significant PLM's were noted, the PLM arousal index was low. Please correlate with clinical history of restless leg syndrome.  4. Sleep efficiency was poor.  RECOMMENDATION:  1. Treatment options for this degree of sleep disordered breathing include weight loss and positional therapy to avoid supine sleep 2. Consider titrating beta blocker further, defer to cardiologist 3. Patient should be cautioned against driving when sleepy.They should be asked to avoid medications with sedative side effects     . Osteoarthritis of right hip   . Pneumonia    hx 30 yrs ago  . Primary osteoarthritis of right hip   . Rheumatoid arthritis ([place])   . Thrombocytopenia ([place])     Family History  Problem Relation Age of Onset  . Hypertension Mother   . Arthritis Mother        ?RA  . Hypertension Father     Past Surgical History:  Procedure Laterality Date  . AV FISTULA PLACEMENT Left 08/26/2015   Procedure: LEFT RADIOCEPHALIC FISTULA CREATION;  Surgeon: Rosetta Posner, MD;  Location: [facility];  Service: Vascular;  Laterality: Left;  . AV FISTULA PLACEMENT Left 11/23/2015   Procedure: ARTERIOVENOUS (AV) FISTULA CREATION;  Surgeon: Rosetta Posner, MD;  Location: [facility] [place];  Service: Vascular;  Laterality: Left;  . BACK SURGERY     x2- discectomy  . CHOLECYSTECTOMY  1994  . CYSTOSCOPY/RETROGRADE/URETEROSCOPY/STONE EXTRACTION WITH BASKET    . ESOPHAGOGASTRODUODENOSCOPY N/A 11/13/2015   Procedure: ESOPHAGOGASTRODUODENOSCOPY (EGD);  Surgeon: Irene Shipper, MD;  [facility] END[facility]COPY;  Service: Endoscopy;  Laterality: N/A;  . INSERTION OF DIALYSIS CATHETER Left 11/23/2015   Procedure: INSERTION OF DIALYSIS CATHETER;  Surgeon: Rosetta Posner, MD;  Location: [place];  Service: Vascular;  Laterality: Left;  . IR GENERIC HISTORICAL Left 08/04/2016   IR DIALY SHUNT INTRO  NEEDLE/INTRACATH INITIAL W/IMG LEFT 08/04/2016 Markus Daft, MD [facility]-INTER[facility]  . IR GENERIC HISTORICAL  08/04/2016   IR US GUIDE VASC ACCESS LEFT 08/04/2016 Markus Daft, MD [facility]  . JOINT REPLACEMENT     Total L-Hip replacement, Right Knee 10/20/09  . LEFT AND RIGHT HEART CATHETERIZATION WITH CORONARY ANGIOGRAM N/A 02/22/2013   Procedure: LEFT AND RIGHT HEART CATHETERIZATION WITH CORONARY ANGIOGRAM;  Surgeon: Jolaine Artist, MD;  Location: [facility] CATH LAB;  Service: Cardiovascular;  Laterality: N/A;  . LITHOTRIPSY  90's  . PERIPHERAL VASCULAR CATHETERIZATION Left 11/19/2015   Procedure: A/V/Fistulagram;  Surgeon: Conrad Goulding, MD;  Location: [place] CV LAB;  Service: Cardiovascular;  Laterality: Left;  . PERIPHERAL VASCULAR CATHETERIZATION  Left 07/21/2016   Procedure: A/V Fistulagram;  Surgeon: Conrad Casey, MD;  Location: [place] CV LAB;  Service: Cardiovascular;  Laterality: Left;  arm  . REVISON OF ARTERIOVENOUS FISTULA Left 05/30/2016   Procedure: REVISION LEFT UPPER ARM FISTULA;  Surgeon: Conrad Cullomburg, MD;  Location: [place];  Service: Vascular;  Laterality: Left;  . REVISON OF ARTERIOVENOUS FISTULA Left 07/22/2016   Procedure: REVISON OF BASILIC VEIN TRANSPOSITION ANASTOMOSIS;  Surgeon: Rosetta Posner, MD;  Location: [place];  Service: Vascular;  Laterality: Left;  . SPINE SURGERY     x 2  . TOTAL HIP ARTHROPLASTY Right 03/22/2016   Procedure: RIGHT TOTAL HIP ARTHROPLASTY ANTERIOR APPROACH;  Surgeon: Mcarthur Rossetti, MD;  Location: [place];  Service: Orthopedics;  Laterality: Right;   Social History   Occupational History  . Occupation: retired Personnel officer: RETIRED  Tobacco Use  . Smoking status: Never Smoker  . Smokeless tobacco: Never Used  Substance and Sexual Activity  . Alcohol use: No    Comment: occasional  . Drug use: No  . Sexual activity: Not on file

## 2018-08-28 ENCOUNTER — Other Ambulatory Visit (INDEPENDENT_AMBULATORY_CARE_PROVIDER_SITE_OTHER): Payer: Self-pay

## 2018-08-28 DIAGNOSIS — Z96641 Presence of right artificial hip joint: Secondary | ICD-10-CM

## 2018-08-29 ENCOUNTER — Telehealth: Payer: Self-pay

## 2018-08-29 DIAGNOSIS — D631 Anemia in chronic kidney disease: Secondary | ICD-10-CM | POA: Diagnosis not present

## 2018-08-29 DIAGNOSIS — N2581 Secondary hyperparathyroidism of renal origin: Secondary | ICD-10-CM | POA: Diagnosis not present

## 2018-08-29 DIAGNOSIS — I4891 Unspecified atrial fibrillation: Secondary | ICD-10-CM | POA: Diagnosis not present

## 2018-08-29 DIAGNOSIS — D509 Iron deficiency anemia, unspecified: Secondary | ICD-10-CM | POA: Diagnosis not present

## 2018-08-29 DIAGNOSIS — N186 End stage renal disease: Secondary | ICD-10-CM | POA: Diagnosis not present

## 2018-08-29 DIAGNOSIS — Z992 Dependence on renal dialysis: Secondary | ICD-10-CM | POA: Diagnosis not present

## 2018-08-29 NOTE — Telephone Encounter (Signed)
Copied from [place] [id number]. Topic: General - Other >> Aug 28, 2018  8:18 AM Bruce Cole wrote:  Reason for CRM: Pt needs to speak with Debbrah Alar about a test. Pt doesn't want to discuss with anyone but her/ please advise

## 2018-08-30 ENCOUNTER — Telehealth: Payer: Self-pay | Admitting: Family

## 2018-08-30 MED ORDER — VALACYCLOVIR HCL 500 MG PO TABS: ORAL_TABLET | ORAL | 1 refills | Status: DC

## 2018-08-30 NOTE — Addendum Note (Signed)
Addended by: Debbrah Alar on: 08/30/2018 01:45 PM   Modules accepted: Orders

## 2018-08-30 NOTE — Telephone Encounter (Signed)
Pt called in this morning requesting to speak with NP O'sullivan about a medication and test and states that it is very important that she call him asap/ please advise

## 2018-08-30 NOTE — Telephone Encounter (Signed)
Attempted to reach patient on both numbers. No answer. Will try again later.

## 2018-08-30 NOTE — Telephone Encounter (Signed)
Please call patient at your convenience.

## 2018-08-30 NOTE — Telephone Encounter (Signed)
Spoke with patient and he told me that he believes that the "shingles" on his hip is actually a flare up of his HSV2 and requests rx to go to his pharmacy for daily valtrex.  Will send.

## 2018-08-31 DIAGNOSIS — I4891 Unspecified atrial fibrillation: Secondary | ICD-10-CM | POA: Diagnosis not present

## 2018-08-31 DIAGNOSIS — N186 End stage renal disease: Secondary | ICD-10-CM | POA: Diagnosis not present

## 2018-08-31 DIAGNOSIS — D509 Iron deficiency anemia, unspecified: Secondary | ICD-10-CM | POA: Diagnosis not present

## 2018-08-31 DIAGNOSIS — D631 Anemia in chronic kidney disease: Secondary | ICD-10-CM | POA: Diagnosis not present

## 2018-08-31 DIAGNOSIS — Z992 Dependence on renal dialysis: Secondary | ICD-10-CM | POA: Diagnosis not present

## 2018-08-31 DIAGNOSIS — N2581 Secondary hyperparathyroidism of renal origin: Secondary | ICD-10-CM | POA: Diagnosis not present

## 2018-09-01 DIAGNOSIS — D509 Iron deficiency anemia, unspecified: Secondary | ICD-10-CM | POA: Diagnosis not present

## 2018-09-01 DIAGNOSIS — D631 Anemia in chronic kidney disease: Secondary | ICD-10-CM | POA: Diagnosis not present

## 2018-09-01 DIAGNOSIS — N2581 Secondary hyperparathyroidism of renal origin: Secondary | ICD-10-CM | POA: Diagnosis not present

## 2018-09-01 DIAGNOSIS — I4891 Unspecified atrial fibrillation: Secondary | ICD-10-CM | POA: Diagnosis not present

## 2018-09-01 DIAGNOSIS — Z992 Dependence on renal dialysis: Secondary | ICD-10-CM | POA: Diagnosis not present

## 2018-09-01 DIAGNOSIS — N186 End stage renal disease: Secondary | ICD-10-CM | POA: Diagnosis not present

## 2018-09-03 DIAGNOSIS — N186 End stage renal disease: Secondary | ICD-10-CM | POA: Diagnosis not present

## 2018-09-03 DIAGNOSIS — D509 Iron deficiency anemia, unspecified: Secondary | ICD-10-CM | POA: Diagnosis not present

## 2018-09-03 DIAGNOSIS — D631 Anemia in chronic kidney disease: Secondary | ICD-10-CM | POA: Diagnosis not present

## 2018-09-03 DIAGNOSIS — I4891 Unspecified atrial fibrillation: Secondary | ICD-10-CM | POA: Diagnosis not present

## 2018-09-03 DIAGNOSIS — N2581 Secondary hyperparathyroidism of renal origin: Secondary | ICD-10-CM | POA: Diagnosis not present

## 2018-09-03 DIAGNOSIS — Z992 Dependence on renal dialysis: Secondary | ICD-10-CM | POA: Diagnosis not present

## 2018-09-03 NOTE — Telephone Encounter (Signed)
See 2/12 phone note

## 2018-09-05 DIAGNOSIS — N186 End stage renal disease: Secondary | ICD-10-CM | POA: Diagnosis not present

## 2018-09-05 DIAGNOSIS — N2581 Secondary hyperparathyroidism of renal origin: Secondary | ICD-10-CM | POA: Diagnosis not present

## 2018-09-05 DIAGNOSIS — D509 Iron deficiency anemia, unspecified: Secondary | ICD-10-CM | POA: Diagnosis not present

## 2018-09-05 DIAGNOSIS — D631 Anemia in chronic kidney disease: Secondary | ICD-10-CM | POA: Diagnosis not present

## 2018-09-05 DIAGNOSIS — I4891 Unspecified atrial fibrillation: Secondary | ICD-10-CM | POA: Diagnosis not present

## 2018-09-05 DIAGNOSIS — Z992 Dependence on renal dialysis: Secondary | ICD-10-CM | POA: Diagnosis not present

## 2018-09-06 LAB — PROTIME-INR

## 2018-09-07 DIAGNOSIS — I4891 Unspecified atrial fibrillation: Secondary | ICD-10-CM | POA: Diagnosis not present

## 2018-09-07 DIAGNOSIS — D509 Iron deficiency anemia, unspecified: Secondary | ICD-10-CM | POA: Diagnosis not present

## 2018-09-07 DIAGNOSIS — D631 Anemia in chronic kidney disease: Secondary | ICD-10-CM | POA: Diagnosis not present

## 2018-09-07 DIAGNOSIS — N186 End stage renal disease: Secondary | ICD-10-CM | POA: Diagnosis not present

## 2018-09-07 DIAGNOSIS — Z992 Dependence on renal dialysis: Secondary | ICD-10-CM | POA: Diagnosis not present

## 2018-09-07 DIAGNOSIS — N2581 Secondary hyperparathyroidism of renal origin: Secondary | ICD-10-CM | POA: Diagnosis not present

## 2018-09-08 DIAGNOSIS — N186 End stage renal disease: Secondary | ICD-10-CM | POA: Diagnosis not present

## 2018-09-08 DIAGNOSIS — I4891 Unspecified atrial fibrillation: Secondary | ICD-10-CM | POA: Diagnosis not present

## 2018-09-08 DIAGNOSIS — N2581 Secondary hyperparathyroidism of renal origin: Secondary | ICD-10-CM | POA: Diagnosis not present

## 2018-09-08 DIAGNOSIS — D631 Anemia in chronic kidney disease: Secondary | ICD-10-CM | POA: Diagnosis not present

## 2018-09-08 DIAGNOSIS — Z992 Dependence on renal dialysis: Secondary | ICD-10-CM | POA: Diagnosis not present

## 2018-09-08 DIAGNOSIS — D509 Iron deficiency anemia, unspecified: Secondary | ICD-10-CM | POA: Diagnosis not present

## 2018-09-10 ENCOUNTER — Telehealth: Payer: Self-pay | Admitting: *Deleted

## 2018-09-10 ENCOUNTER — Encounter (INDEPENDENT_AMBULATORY_CARE_PROVIDER_SITE_OTHER): Payer: Self-pay | Admitting: *Deleted

## 2018-09-10 DIAGNOSIS — I4891 Unspecified atrial fibrillation: Secondary | ICD-10-CM | POA: Diagnosis not present

## 2018-09-10 DIAGNOSIS — D631 Anemia in chronic kidney disease: Secondary | ICD-10-CM | POA: Diagnosis not present

## 2018-09-10 DIAGNOSIS — N2581 Secondary hyperparathyroidism of renal origin: Secondary | ICD-10-CM | POA: Diagnosis not present

## 2018-09-10 DIAGNOSIS — D509 Iron deficiency anemia, unspecified: Secondary | ICD-10-CM | POA: Diagnosis not present

## 2018-09-10 DIAGNOSIS — N186 End stage renal disease: Secondary | ICD-10-CM | POA: Diagnosis not present

## 2018-09-10 DIAGNOSIS — Z992 Dependence on renal dialysis: Secondary | ICD-10-CM | POA: Diagnosis not present

## 2018-09-10 NOTE — Telephone Encounter (Signed)
Received PT/INR results from [facility]; forwarded to provider/SLS 02/24

## 2018-09-11 ENCOUNTER — Other Ambulatory Visit: Payer: Self-pay | Admitting: Family

## 2018-09-12 DIAGNOSIS — I4891 Unspecified atrial fibrillation: Secondary | ICD-10-CM | POA: Diagnosis not present

## 2018-09-12 DIAGNOSIS — N186 End stage renal disease: Secondary | ICD-10-CM | POA: Diagnosis not present

## 2018-09-12 DIAGNOSIS — Z992 Dependence on renal dialysis: Secondary | ICD-10-CM | POA: Diagnosis not present

## 2018-09-12 DIAGNOSIS — D631 Anemia in chronic kidney disease: Secondary | ICD-10-CM | POA: Diagnosis not present

## 2018-09-12 DIAGNOSIS — N2581 Secondary hyperparathyroidism of renal origin: Secondary | ICD-10-CM | POA: Diagnosis not present

## 2018-09-12 DIAGNOSIS — D509 Iron deficiency anemia, unspecified: Secondary | ICD-10-CM | POA: Diagnosis not present

## 2018-09-14 DIAGNOSIS — N2581 Secondary hyperparathyroidism of renal origin: Secondary | ICD-10-CM | POA: Diagnosis not present

## 2018-09-14 DIAGNOSIS — D509 Iron deficiency anemia, unspecified: Secondary | ICD-10-CM | POA: Diagnosis not present

## 2018-09-14 DIAGNOSIS — Z992 Dependence on renal dialysis: Secondary | ICD-10-CM | POA: Diagnosis not present

## 2018-09-14 DIAGNOSIS — I4891 Unspecified atrial fibrillation: Secondary | ICD-10-CM | POA: Diagnosis not present

## 2018-09-14 DIAGNOSIS — D631 Anemia in chronic kidney disease: Secondary | ICD-10-CM | POA: Diagnosis not present

## 2018-09-14 DIAGNOSIS — N186 End stage renal disease: Secondary | ICD-10-CM | POA: Diagnosis not present

## 2018-09-15 DIAGNOSIS — D631 Anemia in chronic kidney disease: Secondary | ICD-10-CM | POA: Diagnosis not present

## 2018-09-15 DIAGNOSIS — I4891 Unspecified atrial fibrillation: Secondary | ICD-10-CM | POA: Diagnosis not present

## 2018-09-15 DIAGNOSIS — N186 End stage renal disease: Secondary | ICD-10-CM | POA: Diagnosis not present

## 2018-09-15 DIAGNOSIS — D509 Iron deficiency anemia, unspecified: Secondary | ICD-10-CM | POA: Diagnosis not present

## 2018-09-15 DIAGNOSIS — N2581 Secondary hyperparathyroidism of renal origin: Secondary | ICD-10-CM | POA: Diagnosis not present

## 2018-09-15 DIAGNOSIS — Z992 Dependence on renal dialysis: Secondary | ICD-10-CM | POA: Diagnosis not present

## 2018-09-16 DIAGNOSIS — Z992 Dependence on renal dialysis: Secondary | ICD-10-CM | POA: Diagnosis not present

## 2018-09-16 DIAGNOSIS — I158 Other secondary hypertension: Secondary | ICD-10-CM | POA: Diagnosis not present

## 2018-09-16 DIAGNOSIS — N186 End stage renal disease: Secondary | ICD-10-CM | POA: Diagnosis not present

## 2018-09-17 DIAGNOSIS — I4891 Unspecified atrial fibrillation: Secondary | ICD-10-CM | POA: Diagnosis not present

## 2018-09-17 DIAGNOSIS — Z992 Dependence on renal dialysis: Secondary | ICD-10-CM | POA: Diagnosis not present

## 2018-09-17 DIAGNOSIS — N186 End stage renal disease: Secondary | ICD-10-CM | POA: Diagnosis not present

## 2018-09-17 DIAGNOSIS — I5042 Chronic combined systolic (congestive) and diastolic (congestive) heart failure: Secondary | ICD-10-CM | POA: Diagnosis not present

## 2018-09-17 DIAGNOSIS — D631 Anemia in chronic kidney disease: Secondary | ICD-10-CM | POA: Diagnosis not present

## 2018-09-17 DIAGNOSIS — I132 Hypertensive heart and chronic kidney disease with heart failure and with stage 5 chronic kidney disease, or end stage renal disease: Secondary | ICD-10-CM | POA: Diagnosis not present

## 2018-09-17 DIAGNOSIS — N2581 Secondary hyperparathyroidism of renal origin: Secondary | ICD-10-CM | POA: Diagnosis not present

## 2018-09-17 DIAGNOSIS — E8779 Other fluid overload: Secondary | ICD-10-CM | POA: Diagnosis not present

## 2018-09-18 ENCOUNTER — Ambulatory Visit: Payer: Medicare Other | Admitting: Family

## 2018-09-19 DIAGNOSIS — Z992 Dependence on renal dialysis: Secondary | ICD-10-CM | POA: Diagnosis not present

## 2018-09-19 DIAGNOSIS — I4891 Unspecified atrial fibrillation: Secondary | ICD-10-CM | POA: Diagnosis not present

## 2018-09-19 DIAGNOSIS — D631 Anemia in chronic kidney disease: Secondary | ICD-10-CM | POA: Diagnosis not present

## 2018-09-19 DIAGNOSIS — N186 End stage renal disease: Secondary | ICD-10-CM | POA: Diagnosis not present

## 2018-09-19 DIAGNOSIS — I5042 Chronic combined systolic (congestive) and diastolic (congestive) heart failure: Secondary | ICD-10-CM | POA: Diagnosis not present

## 2018-09-19 DIAGNOSIS — N2581 Secondary hyperparathyroidism of renal origin: Secondary | ICD-10-CM | POA: Diagnosis not present

## 2018-09-21 DIAGNOSIS — Z992 Dependence on renal dialysis: Secondary | ICD-10-CM | POA: Diagnosis not present

## 2018-09-21 DIAGNOSIS — I4891 Unspecified atrial fibrillation: Secondary | ICD-10-CM | POA: Diagnosis not present

## 2018-09-21 DIAGNOSIS — N2581 Secondary hyperparathyroidism of renal origin: Secondary | ICD-10-CM | POA: Diagnosis not present

## 2018-09-21 DIAGNOSIS — N186 End stage renal disease: Secondary | ICD-10-CM | POA: Diagnosis not present

## 2018-09-21 DIAGNOSIS — I5042 Chronic combined systolic (congestive) and diastolic (congestive) heart failure: Secondary | ICD-10-CM | POA: Diagnosis not present

## 2018-09-21 DIAGNOSIS — D631 Anemia in chronic kidney disease: Secondary | ICD-10-CM | POA: Diagnosis not present

## 2018-09-22 DIAGNOSIS — I4891 Unspecified atrial fibrillation: Secondary | ICD-10-CM | POA: Diagnosis not present

## 2018-09-22 DIAGNOSIS — N186 End stage renal disease: Secondary | ICD-10-CM | POA: Diagnosis not present

## 2018-09-22 DIAGNOSIS — Z992 Dependence on renal dialysis: Secondary | ICD-10-CM | POA: Diagnosis not present

## 2018-09-22 DIAGNOSIS — I5042 Chronic combined systolic (congestive) and diastolic (congestive) heart failure: Secondary | ICD-10-CM | POA: Diagnosis not present

## 2018-09-22 DIAGNOSIS — N2581 Secondary hyperparathyroidism of renal origin: Secondary | ICD-10-CM | POA: Diagnosis not present

## 2018-09-22 DIAGNOSIS — D631 Anemia in chronic kidney disease: Secondary | ICD-10-CM | POA: Diagnosis not present

## 2018-09-24 DIAGNOSIS — N2581 Secondary hyperparathyroidism of renal origin: Secondary | ICD-10-CM | POA: Diagnosis not present

## 2018-09-24 DIAGNOSIS — Z992 Dependence on renal dialysis: Secondary | ICD-10-CM | POA: Diagnosis not present

## 2018-09-24 DIAGNOSIS — I5042 Chronic combined systolic (congestive) and diastolic (congestive) heart failure: Secondary | ICD-10-CM | POA: Diagnosis not present

## 2018-09-24 DIAGNOSIS — N186 End stage renal disease: Secondary | ICD-10-CM | POA: Diagnosis not present

## 2018-09-24 DIAGNOSIS — D631 Anemia in chronic kidney disease: Secondary | ICD-10-CM | POA: Diagnosis not present

## 2018-09-24 DIAGNOSIS — I4891 Unspecified atrial fibrillation: Secondary | ICD-10-CM | POA: Diagnosis not present

## 2018-09-26 DIAGNOSIS — N186 End stage renal disease: Secondary | ICD-10-CM | POA: Diagnosis not present

## 2018-09-26 DIAGNOSIS — I5042 Chronic combined systolic (congestive) and diastolic (congestive) heart failure: Secondary | ICD-10-CM | POA: Diagnosis not present

## 2018-09-26 DIAGNOSIS — D631 Anemia in chronic kidney disease: Secondary | ICD-10-CM | POA: Diagnosis not present

## 2018-09-26 DIAGNOSIS — N2581 Secondary hyperparathyroidism of renal origin: Secondary | ICD-10-CM | POA: Diagnosis not present

## 2018-09-26 DIAGNOSIS — I4891 Unspecified atrial fibrillation: Secondary | ICD-10-CM | POA: Diagnosis not present

## 2018-09-26 DIAGNOSIS — Z992 Dependence on renal dialysis: Secondary | ICD-10-CM | POA: Diagnosis not present

## 2018-09-28 DIAGNOSIS — N186 End stage renal disease: Secondary | ICD-10-CM | POA: Diagnosis not present

## 2018-09-28 DIAGNOSIS — Z992 Dependence on renal dialysis: Secondary | ICD-10-CM | POA: Diagnosis not present

## 2018-09-28 DIAGNOSIS — I4891 Unspecified atrial fibrillation: Secondary | ICD-10-CM | POA: Diagnosis not present

## 2018-09-28 DIAGNOSIS — D631 Anemia in chronic kidney disease: Secondary | ICD-10-CM | POA: Diagnosis not present

## 2018-09-28 DIAGNOSIS — I5042 Chronic combined systolic (congestive) and diastolic (congestive) heart failure: Secondary | ICD-10-CM | POA: Diagnosis not present

## 2018-09-28 DIAGNOSIS — N2581 Secondary hyperparathyroidism of renal origin: Secondary | ICD-10-CM | POA: Diagnosis not present

## 2018-09-29 DIAGNOSIS — I5042 Chronic combined systolic (congestive) and diastolic (congestive) heart failure: Secondary | ICD-10-CM | POA: Diagnosis not present

## 2018-09-29 DIAGNOSIS — N186 End stage renal disease: Secondary | ICD-10-CM | POA: Diagnosis not present

## 2018-09-29 DIAGNOSIS — Z992 Dependence on renal dialysis: Secondary | ICD-10-CM | POA: Diagnosis not present

## 2018-09-29 DIAGNOSIS — I4891 Unspecified atrial fibrillation: Secondary | ICD-10-CM | POA: Diagnosis not present

## 2018-09-29 DIAGNOSIS — N2581 Secondary hyperparathyroidism of renal origin: Secondary | ICD-10-CM | POA: Diagnosis not present

## 2018-09-29 DIAGNOSIS — D631 Anemia in chronic kidney disease: Secondary | ICD-10-CM | POA: Diagnosis not present

## 2018-10-01 DIAGNOSIS — D631 Anemia in chronic kidney disease: Secondary | ICD-10-CM | POA: Diagnosis not present

## 2018-10-01 DIAGNOSIS — N186 End stage renal disease: Secondary | ICD-10-CM | POA: Diagnosis not present

## 2018-10-01 DIAGNOSIS — Z992 Dependence on renal dialysis: Secondary | ICD-10-CM | POA: Diagnosis not present

## 2018-10-01 DIAGNOSIS — I5042 Chronic combined systolic (congestive) and diastolic (congestive) heart failure: Secondary | ICD-10-CM | POA: Diagnosis not present

## 2018-10-01 DIAGNOSIS — I4891 Unspecified atrial fibrillation: Secondary | ICD-10-CM | POA: Diagnosis not present

## 2018-10-01 DIAGNOSIS — N2581 Secondary hyperparathyroidism of renal origin: Secondary | ICD-10-CM | POA: Diagnosis not present

## 2018-10-03 DIAGNOSIS — N186 End stage renal disease: Secondary | ICD-10-CM | POA: Diagnosis not present

## 2018-10-03 DIAGNOSIS — D631 Anemia in chronic kidney disease: Secondary | ICD-10-CM | POA: Diagnosis not present

## 2018-10-03 DIAGNOSIS — Z992 Dependence on renal dialysis: Secondary | ICD-10-CM | POA: Diagnosis not present

## 2018-10-03 DIAGNOSIS — I5042 Chronic combined systolic (congestive) and diastolic (congestive) heart failure: Secondary | ICD-10-CM | POA: Diagnosis not present

## 2018-10-03 DIAGNOSIS — I4891 Unspecified atrial fibrillation: Secondary | ICD-10-CM | POA: Diagnosis not present

## 2018-10-03 DIAGNOSIS — N2581 Secondary hyperparathyroidism of renal origin: Secondary | ICD-10-CM | POA: Diagnosis not present

## 2018-10-05 DIAGNOSIS — I5042 Chronic combined systolic (congestive) and diastolic (congestive) heart failure: Secondary | ICD-10-CM | POA: Diagnosis not present

## 2018-10-05 DIAGNOSIS — N186 End stage renal disease: Secondary | ICD-10-CM | POA: Diagnosis not present

## 2018-10-05 DIAGNOSIS — D631 Anemia in chronic kidney disease: Secondary | ICD-10-CM | POA: Diagnosis not present

## 2018-10-05 DIAGNOSIS — I4891 Unspecified atrial fibrillation: Secondary | ICD-10-CM | POA: Diagnosis not present

## 2018-10-05 DIAGNOSIS — Z992 Dependence on renal dialysis: Secondary | ICD-10-CM | POA: Diagnosis not present

## 2018-10-05 DIAGNOSIS — N2581 Secondary hyperparathyroidism of renal origin: Secondary | ICD-10-CM | POA: Diagnosis not present

## 2018-10-06 DIAGNOSIS — N186 End stage renal disease: Secondary | ICD-10-CM | POA: Diagnosis not present

## 2018-10-06 DIAGNOSIS — Z992 Dependence on renal dialysis: Secondary | ICD-10-CM | POA: Diagnosis not present

## 2018-10-06 DIAGNOSIS — I5042 Chronic combined systolic (congestive) and diastolic (congestive) heart failure: Secondary | ICD-10-CM | POA: Diagnosis not present

## 2018-10-06 DIAGNOSIS — I4891 Unspecified atrial fibrillation: Secondary | ICD-10-CM | POA: Diagnosis not present

## 2018-10-06 DIAGNOSIS — N2581 Secondary hyperparathyroidism of renal origin: Secondary | ICD-10-CM | POA: Diagnosis not present

## 2018-10-06 DIAGNOSIS — D631 Anemia in chronic kidney disease: Secondary | ICD-10-CM | POA: Diagnosis not present

## 2018-10-08 ENCOUNTER — Telehealth: Payer: Self-pay | Admitting: *Deleted

## 2018-10-08 DIAGNOSIS — N186 End stage renal disease: Secondary | ICD-10-CM | POA: Diagnosis not present

## 2018-10-08 DIAGNOSIS — I4891 Unspecified atrial fibrillation: Secondary | ICD-10-CM | POA: Diagnosis not present

## 2018-10-08 DIAGNOSIS — N2581 Secondary hyperparathyroidism of renal origin: Secondary | ICD-10-CM | POA: Diagnosis not present

## 2018-10-08 DIAGNOSIS — D631 Anemia in chronic kidney disease: Secondary | ICD-10-CM | POA: Diagnosis not present

## 2018-10-08 DIAGNOSIS — Z992 Dependence on renal dialysis: Secondary | ICD-10-CM | POA: Diagnosis not present

## 2018-10-08 DIAGNOSIS — I5042 Chronic combined systolic (congestive) and diastolic (congestive) heart failure: Secondary | ICD-10-CM | POA: Diagnosis not present

## 2018-10-08 NOTE — Telephone Encounter (Signed)
Received PT/INR results from [facility]; forwarded to provider/SLS 03/23

## 2018-10-09 ENCOUNTER — Ambulatory Visit: Payer: Medicare Other | Admitting: Family

## 2018-10-09 ENCOUNTER — Other Ambulatory Visit: Payer: Self-pay | Admitting: Family

## 2018-10-09 ENCOUNTER — Telehealth (INDEPENDENT_AMBULATORY_CARE_PROVIDER_SITE_OTHER): Payer: Medicare Other | Admitting: Family

## 2018-10-09 DIAGNOSIS — N186 End stage renal disease: Secondary | ICD-10-CM | POA: Diagnosis not present

## 2018-10-09 DIAGNOSIS — I10 Essential (primary) hypertension: Secondary | ICD-10-CM | POA: Diagnosis not present

## 2018-10-09 DIAGNOSIS — I4891 Unspecified atrial fibrillation: Secondary | ICD-10-CM

## 2018-10-09 DIAGNOSIS — Z992 Dependence on renal dialysis: Secondary | ICD-10-CM | POA: Diagnosis not present

## 2018-10-09 DIAGNOSIS — M1A9XX0 Chronic gout, unspecified, without tophus (tophi): Secondary | ICD-10-CM

## 2018-10-09 DIAGNOSIS — B0229 Other postherpetic nervous system involvement: Secondary | ICD-10-CM

## 2018-10-09 DIAGNOSIS — M0579 Rheumatoid arthritis with rheumatoid factor of multiple sites without organ or systems involvement: Secondary | ICD-10-CM

## 2018-10-09 MED ORDER — PANTOPRAZOLE SODIUM 40 MG PO TBEC: 40.0000 mg | DELAYED_RELEASE_TABLET | Freq: Every day | ORAL | 3 refills | Status: DC

## 2018-10-09 MED ORDER — PREGABALIN 50 MG PO CAPS: ORAL_CAPSULE | ORAL | 2 refills | Status: DC

## 2018-10-09 MED ORDER — HYDROXYCHLOROQUINE SULFATE 200 MG PO TABS: ORAL_TABLET | ORAL | 0 refills | Status: DC

## 2018-10-09 NOTE — Progress Notes (Signed)
Virtual Visit via Telephone Note  I connected with [place] on 10/09/18 at  3:00 PM EDT by telephone and verified that I am speaking with the correct person using two identifiers.   I discussed the limitations, risks, security and privacy concerns of performing an evaluation and management service by telephone and the availability of in person appointments. I also discussed with the patient that there may be a patient responsible charge related to this service. The patient expressed understanding and agreed to proceed.   History of Present Illness:  Patient presents today via phone for follow up. His daughter Shirlean Schlein is also on a 3 way call for this appointment.  Post herpetic neuralgia- last visit we restarted gabapentin and stopped lyrica. Patient reports that gabapentin worked better and he wished to restart but wonders if we can do a higher dose.    Cough- reports X 6 months.  Worse after eating. Denies fever. Cough is dry.  Would like rx to [facility].  ESRD- continues HD 3 days a week.   HTN- he continues furosemide 40mg  bid.   Gout- denies recent gout flares.   Hyperlipidemia- maintained on lipitor.  Lab Results  Component Value Date   CHOL 129 10/03/2017   HDL 66.30 10/03/2017   LDLCALC 51 10/03/2017   TRIG 57.0 10/03/2017   CHOLHDL 2 10/03/2017   RA- needs follow up with his rheumatologist. Was taking plaquenil but ran out.    AF- maintained on coumadin. Nephrology has been checking INR and managing coumadin dosing.      Observations/Objective: Awake, alert, speech clear.   Assessment and Plan: Cough- seems to be secondary to reflux per history. Will give trial of PPI. Pt is advised to let me know if symptoms worsen or if not improved in the next few weeks.  ESRD- continues HD.  RA- gave a 1 month supply of plaquenil and advised pt to follow up with his rheumatologist for further refills.  HTN- reports BP has been stable at HD.  Post herpetic  neuralgia- d/c gabapentin. Start lyrica 50mg  daily after HD.  Pt is advised to let me know if he develops somnolence at this dose. (I was unable to access the [place] controlled substance registry today.)  AF- clinically stable, coumadin management per nephrology.   Gout- denies recent gout flares.   Follow Up Instructions:    I discussed the assessment and treatment plan with the patient. The patient was provided an opportunity to ask questions and all were answered. The patient agreed with the plan and demonstrated an understanding of the instructions.   The patient was advised to call back or seek an in-person evaluation if the symptoms worsen or if the condition fails to improve as anticipated.  I provided 15 minutes of non-face-to-face time during this encounter.  Patient is advised to follow up in 3 months.    Nance Pear, NP

## 2018-10-10 DIAGNOSIS — I4891 Unspecified atrial fibrillation: Secondary | ICD-10-CM | POA: Diagnosis not present

## 2018-10-10 DIAGNOSIS — Z992 Dependence on renal dialysis: Secondary | ICD-10-CM | POA: Diagnosis not present

## 2018-10-10 DIAGNOSIS — N2581 Secondary hyperparathyroidism of renal origin: Secondary | ICD-10-CM | POA: Diagnosis not present

## 2018-10-10 DIAGNOSIS — D631 Anemia in chronic kidney disease: Secondary | ICD-10-CM | POA: Diagnosis not present

## 2018-10-10 DIAGNOSIS — I5042 Chronic combined systolic (congestive) and diastolic (congestive) heart failure: Secondary | ICD-10-CM | POA: Diagnosis not present

## 2018-10-10 DIAGNOSIS — N186 End stage renal disease: Secondary | ICD-10-CM | POA: Diagnosis not present

## 2018-10-11 ENCOUNTER — Telehealth: Payer: Self-pay | Admitting: *Deleted

## 2018-10-11 NOTE — Telephone Encounter (Signed)
Received PT/INR results from [facility] via [facility]; forwarded to provider/SLS 03/26  Received Lab Report results from [facility] via [employer] [facility]; forwarded to provider/SLS 03/26

## 2018-10-12 DIAGNOSIS — Z992 Dependence on renal dialysis: Secondary | ICD-10-CM | POA: Diagnosis not present

## 2018-10-12 DIAGNOSIS — N2581 Secondary hyperparathyroidism of renal origin: Secondary | ICD-10-CM | POA: Diagnosis not present

## 2018-10-12 DIAGNOSIS — I5042 Chronic combined systolic (congestive) and diastolic (congestive) heart failure: Secondary | ICD-10-CM | POA: Diagnosis not present

## 2018-10-12 DIAGNOSIS — I4891 Unspecified atrial fibrillation: Secondary | ICD-10-CM | POA: Diagnosis not present

## 2018-10-12 DIAGNOSIS — N186 End stage renal disease: Secondary | ICD-10-CM | POA: Diagnosis not present

## 2018-10-12 DIAGNOSIS — D631 Anemia in chronic kidney disease: Secondary | ICD-10-CM | POA: Diagnosis not present

## 2018-10-13 DIAGNOSIS — D631 Anemia in chronic kidney disease: Secondary | ICD-10-CM | POA: Diagnosis not present

## 2018-10-13 DIAGNOSIS — N186 End stage renal disease: Secondary | ICD-10-CM | POA: Diagnosis not present

## 2018-10-13 DIAGNOSIS — I5042 Chronic combined systolic (congestive) and diastolic (congestive) heart failure: Secondary | ICD-10-CM | POA: Diagnosis not present

## 2018-10-13 DIAGNOSIS — I4891 Unspecified atrial fibrillation: Secondary | ICD-10-CM | POA: Diagnosis not present

## 2018-10-13 DIAGNOSIS — Z992 Dependence on renal dialysis: Secondary | ICD-10-CM | POA: Diagnosis not present

## 2018-10-13 DIAGNOSIS — N2581 Secondary hyperparathyroidism of renal origin: Secondary | ICD-10-CM | POA: Diagnosis not present

## 2018-10-15 DIAGNOSIS — I5042 Chronic combined systolic (congestive) and diastolic (congestive) heart failure: Secondary | ICD-10-CM | POA: Diagnosis not present

## 2018-10-15 DIAGNOSIS — I4891 Unspecified atrial fibrillation: Secondary | ICD-10-CM | POA: Diagnosis not present

## 2018-10-15 DIAGNOSIS — N186 End stage renal disease: Secondary | ICD-10-CM | POA: Diagnosis not present

## 2018-10-15 DIAGNOSIS — N2581 Secondary hyperparathyroidism of renal origin: Secondary | ICD-10-CM | POA: Diagnosis not present

## 2018-10-15 DIAGNOSIS — Z992 Dependence on renal dialysis: Secondary | ICD-10-CM | POA: Diagnosis not present

## 2018-10-15 DIAGNOSIS — D631 Anemia in chronic kidney disease: Secondary | ICD-10-CM | POA: Diagnosis not present

## 2018-10-17 DIAGNOSIS — I132 Hypertensive heart and chronic kidney disease with heart failure and with stage 5 chronic kidney disease, or end stage renal disease: Secondary | ICD-10-CM | POA: Diagnosis not present

## 2018-10-17 DIAGNOSIS — N186 End stage renal disease: Secondary | ICD-10-CM | POA: Diagnosis not present

## 2018-10-17 DIAGNOSIS — E8779 Other fluid overload: Secondary | ICD-10-CM | POA: Diagnosis not present

## 2018-10-17 DIAGNOSIS — Z992 Dependence on renal dialysis: Secondary | ICD-10-CM | POA: Diagnosis not present

## 2018-10-17 DIAGNOSIS — D631 Anemia in chronic kidney disease: Secondary | ICD-10-CM | POA: Diagnosis not present

## 2018-10-17 DIAGNOSIS — I158 Other secondary hypertension: Secondary | ICD-10-CM | POA: Diagnosis not present

## 2018-10-17 DIAGNOSIS — I4891 Unspecified atrial fibrillation: Secondary | ICD-10-CM | POA: Diagnosis not present

## 2018-10-17 DIAGNOSIS — I5042 Chronic combined systolic (congestive) and diastolic (congestive) heart failure: Secondary | ICD-10-CM | POA: Diagnosis not present

## 2018-10-17 DIAGNOSIS — N2581 Secondary hyperparathyroidism of renal origin: Secondary | ICD-10-CM | POA: Diagnosis not present

## 2018-10-19 DIAGNOSIS — I4891 Unspecified atrial fibrillation: Secondary | ICD-10-CM | POA: Diagnosis not present

## 2018-10-19 DIAGNOSIS — N2581 Secondary hyperparathyroidism of renal origin: Secondary | ICD-10-CM | POA: Diagnosis not present

## 2018-10-19 DIAGNOSIS — I5042 Chronic combined systolic (congestive) and diastolic (congestive) heart failure: Secondary | ICD-10-CM | POA: Diagnosis not present

## 2018-10-19 DIAGNOSIS — D631 Anemia in chronic kidney disease: Secondary | ICD-10-CM | POA: Diagnosis not present

## 2018-10-19 DIAGNOSIS — N186 End stage renal disease: Secondary | ICD-10-CM | POA: Diagnosis not present

## 2018-10-19 DIAGNOSIS — Z992 Dependence on renal dialysis: Secondary | ICD-10-CM | POA: Diagnosis not present

## 2018-10-20 DIAGNOSIS — Z992 Dependence on renal dialysis: Secondary | ICD-10-CM | POA: Diagnosis not present

## 2018-10-20 DIAGNOSIS — N2581 Secondary hyperparathyroidism of renal origin: Secondary | ICD-10-CM | POA: Diagnosis not present

## 2018-10-20 DIAGNOSIS — N186 End stage renal disease: Secondary | ICD-10-CM | POA: Diagnosis not present

## 2018-10-20 DIAGNOSIS — D631 Anemia in chronic kidney disease: Secondary | ICD-10-CM | POA: Diagnosis not present

## 2018-10-20 DIAGNOSIS — I4891 Unspecified atrial fibrillation: Secondary | ICD-10-CM | POA: Diagnosis not present

## 2018-10-20 DIAGNOSIS — I5042 Chronic combined systolic (congestive) and diastolic (congestive) heart failure: Secondary | ICD-10-CM | POA: Diagnosis not present

## 2018-10-22 DIAGNOSIS — I5042 Chronic combined systolic (congestive) and diastolic (congestive) heart failure: Secondary | ICD-10-CM | POA: Diagnosis not present

## 2018-10-22 DIAGNOSIS — N186 End stage renal disease: Secondary | ICD-10-CM | POA: Diagnosis not present

## 2018-10-22 DIAGNOSIS — N2581 Secondary hyperparathyroidism of renal origin: Secondary | ICD-10-CM | POA: Diagnosis not present

## 2018-10-22 DIAGNOSIS — D631 Anemia in chronic kidney disease: Secondary | ICD-10-CM | POA: Diagnosis not present

## 2018-10-22 DIAGNOSIS — Z992 Dependence on renal dialysis: Secondary | ICD-10-CM | POA: Diagnosis not present

## 2018-10-22 DIAGNOSIS — I4891 Unspecified atrial fibrillation: Secondary | ICD-10-CM | POA: Diagnosis not present

## 2018-10-24 DIAGNOSIS — I4891 Unspecified atrial fibrillation: Secondary | ICD-10-CM | POA: Diagnosis not present

## 2018-10-24 DIAGNOSIS — Z992 Dependence on renal dialysis: Secondary | ICD-10-CM | POA: Diagnosis not present

## 2018-10-24 DIAGNOSIS — N2581 Secondary hyperparathyroidism of renal origin: Secondary | ICD-10-CM | POA: Diagnosis not present

## 2018-10-24 DIAGNOSIS — N186 End stage renal disease: Secondary | ICD-10-CM | POA: Diagnosis not present

## 2018-10-24 DIAGNOSIS — D631 Anemia in chronic kidney disease: Secondary | ICD-10-CM | POA: Diagnosis not present

## 2018-10-24 DIAGNOSIS — I5042 Chronic combined systolic (congestive) and diastolic (congestive) heart failure: Secondary | ICD-10-CM | POA: Diagnosis not present

## 2018-10-26 DIAGNOSIS — D631 Anemia in chronic kidney disease: Secondary | ICD-10-CM | POA: Diagnosis not present

## 2018-10-26 DIAGNOSIS — I4891 Unspecified atrial fibrillation: Secondary | ICD-10-CM | POA: Diagnosis not present

## 2018-10-26 DIAGNOSIS — I5042 Chronic combined systolic (congestive) and diastolic (congestive) heart failure: Secondary | ICD-10-CM | POA: Diagnosis not present

## 2018-10-26 DIAGNOSIS — Z992 Dependence on renal dialysis: Secondary | ICD-10-CM | POA: Diagnosis not present

## 2018-10-26 DIAGNOSIS — N186 End stage renal disease: Secondary | ICD-10-CM | POA: Diagnosis not present

## 2018-10-26 DIAGNOSIS — N2581 Secondary hyperparathyroidism of renal origin: Secondary | ICD-10-CM | POA: Diagnosis not present

## 2018-10-27 DIAGNOSIS — I4891 Unspecified atrial fibrillation: Secondary | ICD-10-CM | POA: Diagnosis not present

## 2018-10-27 DIAGNOSIS — Z992 Dependence on renal dialysis: Secondary | ICD-10-CM | POA: Diagnosis not present

## 2018-10-27 DIAGNOSIS — N2581 Secondary hyperparathyroidism of renal origin: Secondary | ICD-10-CM | POA: Diagnosis not present

## 2018-10-27 DIAGNOSIS — N186 End stage renal disease: Secondary | ICD-10-CM | POA: Diagnosis not present

## 2018-10-27 DIAGNOSIS — I5042 Chronic combined systolic (congestive) and diastolic (congestive) heart failure: Secondary | ICD-10-CM | POA: Diagnosis not present

## 2018-10-27 DIAGNOSIS — D631 Anemia in chronic kidney disease: Secondary | ICD-10-CM | POA: Diagnosis not present

## 2018-10-29 ENCOUNTER — Emergency Department (HOSPITAL_BASED_OUTPATIENT_CLINIC_OR_DEPARTMENT_OTHER)
Admission: EM | Admit: 2018-10-29 | Discharge: 2018-10-30 | Disposition: A | Discharge status: Home / Self Care | Payer: Medicare Other | Attending: Emergency Medicine | Admitting: Emergency Medicine

## 2018-10-29 ENCOUNTER — Other Ambulatory Visit: Payer: Self-pay

## 2018-10-29 ENCOUNTER — Encounter (HOSPITAL_BASED_OUTPATIENT_CLINIC_OR_DEPARTMENT_OTHER): Payer: Self-pay | Admitting: Emergency Medicine

## 2018-10-29 DIAGNOSIS — N186 End stage renal disease: Secondary | ICD-10-CM | POA: Diagnosis not present

## 2018-10-29 DIAGNOSIS — Z7901 Long term (current) use of anticoagulants: Secondary | ICD-10-CM | POA: Insufficient documentation

## 2018-10-29 DIAGNOSIS — I4891 Unspecified atrial fibrillation: Secondary | ICD-10-CM | POA: Diagnosis not present

## 2018-10-29 DIAGNOSIS — I5042 Chronic combined systolic (congestive) and diastolic (congestive) heart failure: Secondary | ICD-10-CM | POA: Insufficient documentation

## 2018-10-29 DIAGNOSIS — R791 Abnormal coagulation profile: Secondary | ICD-10-CM | POA: Insufficient documentation

## 2018-10-29 DIAGNOSIS — T82838A Hemorrhage of vascular prosthetic devices, implants and grafts, initial encounter: Secondary | ICD-10-CM | POA: Diagnosis not present

## 2018-10-29 DIAGNOSIS — Y711 Therapeutic (nonsurgical) and rehabilitative cardiovascular devices associated with adverse incidents: Secondary | ICD-10-CM | POA: Diagnosis not present

## 2018-10-29 DIAGNOSIS — Z992 Dependence on renal dialysis: Secondary | ICD-10-CM | POA: Diagnosis not present

## 2018-10-29 DIAGNOSIS — I132 Hypertensive heart and chronic kidney disease with heart failure and with stage 5 chronic kidney disease, or end stage renal disease: Secondary | ICD-10-CM | POA: Insufficient documentation

## 2018-10-29 DIAGNOSIS — D631 Anemia in chronic kidney disease: Secondary | ICD-10-CM | POA: Diagnosis not present

## 2018-10-29 DIAGNOSIS — E1122 Type 2 diabetes mellitus with diabetic chronic kidney disease: Secondary | ICD-10-CM | POA: Diagnosis not present

## 2018-10-29 DIAGNOSIS — N2581 Secondary hyperparathyroidism of renal origin: Secondary | ICD-10-CM | POA: Diagnosis not present

## 2018-10-29 LAB — CBC WITH DIFFERENTIAL/PLATELET
Abs Immature Granulocytes: 0.02 10*3/uL (ref 0.00–0.07)
Basophils Absolute: 0 10*3/uL (ref 0.0–0.1)
Basophils Relative: 1 %
Eosinophils Absolute: 0.2 10*3/uL (ref 0.0–0.5)
Eosinophils Relative: 2 %
HCT: 34.4 % — ABNORMAL LOW (ref 39.0–52.0)
Hemoglobin: 10.5 g/dL — ABNORMAL LOW (ref 13.0–17.0)
Immature Granulocytes: 0 %
Lymphocytes Relative: 13 %
Lymphs Abs: 1.2 10*3/uL (ref 0.7–4.0)
MCH: 31.6 pg (ref 26.0–34.0)
MCHC: 30.5 g/dL (ref 30.0–36.0)
MCV: 103.6 fL — ABNORMAL HIGH (ref 80.0–100.0)
Monocytes Absolute: 1.1 10*3/uL — ABNORMAL HIGH (ref 0.1–1.0)
Monocytes Relative: 13 %
Neutro Abs: 6.2 10*3/uL (ref 1.7–7.7)
Neutrophils Relative %: 71 %
Platelets: 149 10*3/uL — ABNORMAL LOW (ref 150–400)
RBC: 3.32 MIL/uL — ABNORMAL LOW (ref 4.22–5.81)
RDW: 18 % — ABNORMAL HIGH (ref 11.5–15.5)
WBC: 8.7 10*3/uL (ref 4.0–10.5)
nRBC: 0 % (ref 0.0–0.2)

## 2018-10-29 LAB — BASIC METABOLIC PANEL
Anion gap: 8 (ref 5–15)
BUN: 29 mg/dL — ABNORMAL HIGH (ref 8–23)
CO2: 28 mmol/L (ref 22–32)
Calcium: 9.2 mg/dL (ref 8.9–10.3)
Chloride: 94 mmol/L — ABNORMAL LOW (ref 98–111)
Creatinine, Ser: 5.88 mg/dL — ABNORMAL HIGH (ref 0.61–1.24)
GFR calc Af Amer: 10 mL/min — ABNORMAL LOW (ref >60)
GFR calc non Af Amer: 9 mL/min — ABNORMAL LOW (ref >60)
Glucose, Bld: 153 mg/dL — ABNORMAL HIGH (ref 70–99)
Potassium: 3.3 mmol/L — ABNORMAL LOW (ref 3.5–5.1)
Sodium: 130 mmol/L — ABNORMAL LOW (ref 135–145)

## 2018-10-29 NOTE — ED Notes (Signed)
Removed blood soaked gauze from left arm, site has small amount of bleeding. Applied new gauze and coban until EDP assesses arm.

## 2018-10-29 NOTE — ED Notes (Signed)
Attempted to stick pt in right hand. Pt started tensing up and kicking the bed. Deferred to another nurse after unsuccessful attempt.

## 2018-10-29 NOTE — ED Provider Notes (Signed)
Emergency Department Provider Note   I have reviewed the triage vital signs and the nursing notes.   HISTORY  Chief Complaint Vascular Access Problem   HPI Bruce Cole is a 78 y.o. male who presents the emergency department today bleeding from his graft.  Patient states that he is also on Coumadin but has not had his levels checked recently.  States has been bleeding since approximately noon today with changes bandage.  No lightheadedness or syncope.  No shortness of breath.  No other associated symptoms.   No other associated or modifying symptoms.    Past Medical History:  Diagnosis Date  . Acute blood loss anemia   . Atrial fibrillation (HCC)    Chronic  . Chronic combined systolic and diastolic heart failure ([place])   . Colon polyp 2000  . Dysrhythmia    hx  . ESRD on hemodialysis ([place])    [place]  . Essential hypertension   . Gastritis and gastroduodenitis   . GI bleed   . Gout   . Heart murmur   . History of blood transfusion   . History of kidney stones   . Nephrolithiasis   . OSA (obstructive sleep apnea) 09/02/2013    IMPRESSION :  1. Mild obstructive sleep apnea with hypopneas causing sleep fragmentation and moderate oxygen desaturation.  2. Short runs of nonsustained VT were noted. His beta blocker may need to be titrated 3. Significant PLM's were noted, the PLM arousal index was low. Please correlate with clinical history of restless leg syndrome.  4. Sleep efficiency was poor.  RECOMMENDATION:  1. Treatment options for this degree of sleep disordered breathing include weight loss and positional therapy to avoid supine sleep 2. Consider titrating beta blocker further, defer to cardiologist 3. Patient should be cautioned against driving when sleepy.They should be asked to avoid medications with sedative side effects     . Osteoarthritis of right hip   . Pneumonia    hx 30 yrs ago  . Primary osteoarthritis of right hip   . Rheumatoid  arthritis ([place])   . Thrombocytopenia [facility])     Patient Active Problem List   Diagnosis Date Noted  . High risk medication use 03/17/2017  . Hyperkalemia 08/01/2016  . Aftercare following surgery of the circulatory system 06/17/2016  . Status post total replacement of right hip 03/22/2016  . ESRD on dialysis ([place]) 11/27/2015  . OSA (obstructive sleep apnea) 09/02/2013  . Chronic combined systolic and diastolic heart failure ([place]) 03/16/2013  . Allergic rhinitis 12/18/2012  . Long term current use of anticoagulant therapy 11/25/2010  . Genital herpes 05/31/2010  . DM (diabetes mellitus), type 2 with renal complications ([place]) 44/81/8563  . ERECTILE DYSFUNCTION, ORGANIC 09/21/2009  . Osteoarthritis 09/21/2009  . PERSONAL HX COLONIC POLYPS 08/25/2009  . Gout 07/22/2009  . Essential hypertension 07/22/2009  . ATRIAL FIBRILLATION 07/22/2009  . Rheumatoid arthritis ([place]) 07/22/2009  . NEPHROLITHIASIS, HX OF 07/22/2009    Past Surgical History:  Procedure Laterality Date  . AV FISTULA PLACEMENT Left 08/26/2015   Procedure: LEFT RADIOCEPHALIC FISTULA CREATION;  Surgeon: Rosetta Posner, MD;  Location: [place];  Service: Vascular;  Laterality: Left;  . AV FISTULA PLACEMENT Left 11/23/2015   Procedure: ARTERIOVENOUS (AV) FISTULA CREATION;  Surgeon: Rosetta Posner, MD;  Location: [place];  Service: Vascular;  Laterality: Left;  . BACK SURGERY     x2- discectomy  . CHOLECYSTECTOMY  1994  . CYSTOSCOPY/RETROGRADE/URETEROSCOPY/STONE EXTRACTION WITH BASKET    .  ESOPHAGOGASTRODUODENOSCOPY N/A 11/13/2015   Procedure: ESOPHAGOGASTRODUODENOSCOPY (EGD);  Surgeon: Irene Shipper, MD;  Location: [facility] ENDOSCOPY;  Service: Endoscopy;  Laterality: N/A;  . INSERTION OF DIALYSIS CATHETER Left 11/23/2015   Procedure: INSERTION OF DIALYSIS CATHETER;  Surgeon: Rosetta Posner, MD;  Location: [place];  Service: Vascular;  Laterality: Left;  . IR GENERIC HISTORICAL Left 08/04/2016   IR DIALY SHUNT INTRO NEEDLE/INTRACATH INITIAL W/IMG  LEFT 08/04/2016 Markus Daft, MD [facility]INTER[facility]  . IR GENERIC HISTORICAL  08/04/2016   IR US GUIDE VASC ACCESS LEFT 08/04/2016 Markus Daft, MD [facility]  . JOINT REPLACEMENT     Total L-Hip replacement, Right Knee 10/20/09  . LEFT AND RIGHT HEART CATHETERIZATION WITH CORONARY ANGIOGRAM N/A 02/22/2013   Procedure: LEFT AND RIGHT HEART CATHETERIZATION WITH CORONARY ANGIOGRAM;  Surgeon: Jolaine Artist, MD;  Location: [facility] CATH LAB;  Service: Cardiovascular;  Laterality: N/A;  . LITHOTRIPSY  90's  . PERIPHERAL VASCULAR CATHETERIZATION Left 11/19/2015   Procedure: A/V/Fistulagram;  Surgeon: Conrad Poplar, MD;  Location: [place] CV LAB;  Service: Cardiovascular;  Laterality: Left;  . PERIPHERAL VASCULAR CATHETERIZATION Left 07/21/2016   Procedure: A/V Fistulagram;  Surgeon: Conrad Wheatland, MD;  Location: [place] CV LAB;  Service: Cardiovascular;  Laterality: Left;  arm  . REVISON OF ARTERIOVENOUS FISTULA Left 05/30/2016   Procedure: REVISION LEFT UPPER ARM FISTULA;  Surgeon: Conrad [place], MD;  Location: [place];  Service: Vascular;  Laterality: Left;  . REVISON OF ARTERIOVENOUS FISTULA Left 07/22/2016   Procedure: REVISON OF BASILIC VEIN TRANSPOSITION ANASTOMOSIS;  Surgeon: Rosetta Posner, MD;  Location: [place];  Service: Vascular;  Laterality: Left;  . SPINE SURGERY     x 2  . TOTAL HIP ARTHROPLASTY Right 03/22/2016   Procedure: RIGHT TOTAL HIP ARTHROPLASTY ANTERIOR APPROACH;  Surgeon: Mcarthur Rossetti, MD;  Location: [place];  Service: Orthopedics;  Laterality: Right;    Current Outpatient Rx  . Order #: [id number] Class: Historical Med  . Order #: [id number] Class: Historical Med  . Order #: [id number] Class: Normal  . Order #: [id number] Class: Historical Med  . Order #: [id number] Class: Normal  . Order #: [id number] Class: Normal  . Order #: [id number] Class: Normal  . Order #: [id number] Class: Normal  . Order #: [id number] Class: Print  . Order #: [id number] Class: Normal  . Order #: [id number] Class: Normal  .  Order #: [id number] Class: Normal  . Order #: [id number] Class: Normal  . Order #: [id number] Class: Normal  . Order #: [id number] Class: Normal    Allergies Ace inhibitors  Family History  Problem Relation Age of Onset  . Hypertension Mother   . Arthritis Mother        ?RA  . Hypertension Father     Social History Social History   Tobacco Use  . Smoking status: Never Smoker  . Smokeless tobacco: Never Used  Substance Use Topics  . Alcohol use: No    Comment: occasional  . Drug use: No    Review of Systems  All other systems negative except as documented in the HPI. All pertinent positives and negatives as reviewed in the HPI. ____________________________________________   PHYSICAL EXAM:  VITAL SIGNS: ED Triage Vitals  Enc Vitals Group     BP 10/29/18 2251 (!) 106/54     Pulse Rate 10/29/18 2251 64     Resp 10/29/18 2251 18     Temp 10/29/18 2251 98.5 F (36.9 C)     Temp Source 10/29/18 2251 Oral     SpO2 10/29/18  2251 96 %     Weight 10/29/18 2249 250 lb (113.4 kg)     Height 10/29/18 2249 6\' 3"  (1.905 m)     Head Circumference --      Peak Flow --      Pain Score 10/29/18 2249 0     Pain Loc --      Pain Edu? --      Excl. in [place]? --     Constitutional: Alert and oriented. Well appearing and in no acute distress. Eyes: Conjunctivae are normal. PERRL. EOMI. Head: Atraumatic. Nose: No congestion/rhinnorhea. Mouth/Throat: Mucous membranes are moist.  Oropharynx non-erythematous. Neck: No stridor.  No meningeal signs.   Cardiovascular: Normal rate, regular rhythm. Good peripheral circulation. Grossly normal heart sounds.  Palpable thrill in fistula. Small amount of oozing prior to bandaging.  Respiratory: Normal respiratory effort.  No retractions. Lungs CTAB. Gastrointestinal: Soft and nontender. No distention.  Musculoskeletal: No lower extremity tenderness nor edema. No gross deformities of extremities. Neurologic:  Normal speech and language. No gross  focal neurologic deficits are appreciated.  Skin:  Skin is warm, dry and intact. No rash noted.   ____________________________________________   LABS (all labs ordered are listed, but only abnormal results are displayed)  Labs Reviewed  CBC WITH DIFFERENTIAL/PLATELET - Abnormal; Notable for the following components:      Result Value   RBC 3.32 (*)    Hemoglobin 10.5 (*)    HCT 34.4 (*)    MCV 103.6 (*)    RDW 18.0 (*)    Platelets 149 (*)    Monocytes Absolute 1.1 (*)    All other components within normal limits  BASIC METABOLIC PANEL - Abnormal; Notable for the following components:   Sodium 130 (*)    Potassium 3.3 (*)    Chloride 94 (*)    Glucose, Bld 153 (*)    BUN 29 (*)    Creatinine, Ser 5.88 (*)    GFR calc non Af Amer 9 (*)    GFR calc Af Amer 10 (*)    All other components within normal limits  PROTIME-INR - Abnormal; Notable for the following components:   Prothrombin Time 64.5 (*)    INR 7.8 (*)    All other components within normal limits   ____________________________________________   PROCEDURES  Procedure(s) performed:   Procedures   ____________________________________________   INITIAL IMPRESSION / ASSESSMENT AND PLAN / ED COURSE  Supratherapeutic INR.  Given 2.5 mg of vitamin K.  Still some minor oozing but improved with Coban and wrapped.  Patient will hold his Coumadin tomorrow and get his recheck the next day and decide what to do after that.     Pertinent labs & imaging results that were available during my care of the patient were reviewed by me and considered in my medical decision making (see chart for details).  A medical screening exam was performed and I feel the patient has had an appropriate workup for their chief complaint at this time and likelihood of emergent condition existing is low. They have been counseled on decision, discharge, follow up and which symptoms necessitate immediate return to the emergency department. They  or their family verbally stated understanding and agreement with plan and discharged in stable condition.   ____________________________________________  FINAL CLINICAL IMPRESSION(S) / ED DIAGNOSES  Final diagnoses:  Supratherapeutic INR     MEDICATIONS GIVEN DURING THIS VISIT:  Medications  phytonadione (VITAMIN K) 5 MG tablet (has no administration in time range)  phytonadione (VITAMIN K) tablet 2.5 mg (2.5 mg Oral Given 10/30/18 0043)     NEW OUTPATIENT MEDICATIONS STARTED DURING THIS VISIT:  Discharge Medication List as of 10/30/2018 12:44 AM      Note:  This note was prepared with assistance of Dragon voice recognition software. Occasional wrong-word or sound-a-like substitutions may have occurred due to the inherent limitations of voice recognition software.   Reiko Vinje, Corene Cornea, MD 10/30/18 [phone]

## 2018-10-29 NOTE — ED Triage Notes (Signed)
Pt states he had HD today at 6 am and he is bleeding through his site since then. Pt is on Coumadin.

## 2018-10-30 ENCOUNTER — Telehealth: Payer: Self-pay | Admitting: Family

## 2018-10-30 DIAGNOSIS — T82838A Hemorrhage of vascular prosthetic devices, implants and grafts, initial encounter: Secondary | ICD-10-CM | POA: Diagnosis not present

## 2018-10-30 LAB — PROTIME-INR
INR: 7.8 (ref 0.8–1.2)
Prothrombin Time: 64.5 seconds — ABNORMAL HIGH (ref 11.4–15.2)

## 2018-10-30 MED ORDER — PHYTONADIONE 5 MG PO TABS
ORAL_TABLET | ORAL | Status: AC
  Filled 2018-10-30: qty 1

## 2018-10-30 MED ORDER — PHYTONADIONE 5 MG PO TABS
2.5000 mg | ORAL_TABLET | Freq: Once | ORAL | Status: AC
  Administered 2018-10-30: 2.5 mg via ORAL

## 2018-10-30 NOTE — Telephone Encounter (Signed)
Reached out to patient to follow up on his visit to ED for bleeding.  No answer on cell or home phone. Left message on home phone.  Pt is s/p bleeding  fistula secondary to supratherapeutic INR.  INR should be rechecked at HD.  He is s/p vitamin K.  Left message for pt to make sure that INR is rechecked at HD and to call if he has any questions or concerns.

## 2018-10-30 NOTE — Discharge Instructions (Addendum)
Your INR today was 7.8.  This is well above your goal of 2.5-3.5.  I have given you a 2.5 mg tablet of vitamin K which will help bring this down a little bit but probably not the normal range.  You need to hold your warfarin tomorrow and get your INR rechecked Wednesday before dialysis and get further recommendations based on your level at that time.  If you have any bleeding that does not stop in a reasonable time, please return to the emergency department.

## 2018-10-30 NOTE — Telephone Encounter (Signed)
Copied from [place] [phone]. Topic: Quick Communication - Rx Refill/Question >> Oct 30, 2018  8:41 AM Reyne Dumas L wrote: Medication:   Pt called and left message on [place] 10/29/2018 stating that he would like to speak with Debbrah Alar regarding medication that was given to him.

## 2018-10-30 NOTE — ED Notes (Signed)
Bleeding controlled on left arm , drg intact , verbal understanding of reinforement to drg and to hold coumadin tomorrow

## 2018-10-31 DIAGNOSIS — I5042 Chronic combined systolic (congestive) and diastolic (congestive) heart failure: Secondary | ICD-10-CM | POA: Diagnosis not present

## 2018-10-31 DIAGNOSIS — N186 End stage renal disease: Secondary | ICD-10-CM | POA: Diagnosis not present

## 2018-10-31 DIAGNOSIS — I4891 Unspecified atrial fibrillation: Secondary | ICD-10-CM | POA: Diagnosis not present

## 2018-10-31 DIAGNOSIS — D631 Anemia in chronic kidney disease: Secondary | ICD-10-CM | POA: Diagnosis not present

## 2018-10-31 DIAGNOSIS — N2581 Secondary hyperparathyroidism of renal origin: Secondary | ICD-10-CM | POA: Diagnosis not present

## 2018-10-31 DIAGNOSIS — Z992 Dependence on renal dialysis: Secondary | ICD-10-CM | POA: Diagnosis not present

## 2018-10-31 NOTE — Telephone Encounter (Signed)
Spoke to Desha, [place] at [employer].  We discussed that they will manage his coumadin dosing during the [place] pandemic and then we will take it back over when we can bring him into the office for [place] nurse visits.  They have an INR that has been drawn and is pending.    Left detailed message on pt's cell re: above plan.

## 2018-10-31 NOTE — Telephone Encounter (Signed)
Pt calling from dialysis and Katie the PA, got on the phone to advise they are going to check his coum today, but they need melissa to do medication adjustment. Pt is going to hold his coum today, and he held yesterday, b/c his coum was 7.1 Katie states pt was also on an abx, which may have been a reason for the coum result.  They will fax result to office tomorrow, she wants to make sure [name] follows up with pt. Pt states he will be home tomorrow.

## 2018-10-31 NOTE — Telephone Encounter (Signed)
Per [facility] patient at [place] location [phone].ask for PA [facility]

## 2018-11-02 DIAGNOSIS — I4891 Unspecified atrial fibrillation: Secondary | ICD-10-CM | POA: Diagnosis not present

## 2018-11-02 DIAGNOSIS — N186 End stage renal disease: Secondary | ICD-10-CM | POA: Diagnosis not present

## 2018-11-02 DIAGNOSIS — N2581 Secondary hyperparathyroidism of renal origin: Secondary | ICD-10-CM | POA: Diagnosis not present

## 2018-11-02 DIAGNOSIS — D631 Anemia in chronic kidney disease: Secondary | ICD-10-CM | POA: Diagnosis not present

## 2018-11-02 DIAGNOSIS — I5042 Chronic combined systolic (congestive) and diastolic (congestive) heart failure: Secondary | ICD-10-CM | POA: Diagnosis not present

## 2018-11-02 DIAGNOSIS — Z992 Dependence on renal dialysis: Secondary | ICD-10-CM | POA: Diagnosis not present

## 2018-11-03 DIAGNOSIS — I4891 Unspecified atrial fibrillation: Secondary | ICD-10-CM | POA: Diagnosis not present

## 2018-11-03 DIAGNOSIS — N2581 Secondary hyperparathyroidism of renal origin: Secondary | ICD-10-CM | POA: Diagnosis not present

## 2018-11-03 DIAGNOSIS — D631 Anemia in chronic kidney disease: Secondary | ICD-10-CM | POA: Diagnosis not present

## 2018-11-03 DIAGNOSIS — N186 End stage renal disease: Secondary | ICD-10-CM | POA: Diagnosis not present

## 2018-11-03 DIAGNOSIS — I5042 Chronic combined systolic (congestive) and diastolic (congestive) heart failure: Secondary | ICD-10-CM | POA: Diagnosis not present

## 2018-11-03 DIAGNOSIS — Z992 Dependence on renal dialysis: Secondary | ICD-10-CM | POA: Diagnosis not present

## 2018-11-05 DIAGNOSIS — I5042 Chronic combined systolic (congestive) and diastolic (congestive) heart failure: Secondary | ICD-10-CM | POA: Diagnosis not present

## 2018-11-05 DIAGNOSIS — N186 End stage renal disease: Secondary | ICD-10-CM | POA: Diagnosis not present

## 2018-11-05 DIAGNOSIS — I4891 Unspecified atrial fibrillation: Secondary | ICD-10-CM | POA: Diagnosis not present

## 2018-11-05 DIAGNOSIS — N2581 Secondary hyperparathyroidism of renal origin: Secondary | ICD-10-CM | POA: Diagnosis not present

## 2018-11-05 DIAGNOSIS — Z992 Dependence on renal dialysis: Secondary | ICD-10-CM | POA: Diagnosis not present

## 2018-11-05 DIAGNOSIS — D631 Anemia in chronic kidney disease: Secondary | ICD-10-CM | POA: Diagnosis not present

## 2018-11-07 DIAGNOSIS — I4891 Unspecified atrial fibrillation: Secondary | ICD-10-CM | POA: Diagnosis not present

## 2018-11-07 DIAGNOSIS — Z992 Dependence on renal dialysis: Secondary | ICD-10-CM | POA: Diagnosis not present

## 2018-11-07 DIAGNOSIS — D631 Anemia in chronic kidney disease: Secondary | ICD-10-CM | POA: Diagnosis not present

## 2018-11-07 DIAGNOSIS — I5042 Chronic combined systolic (congestive) and diastolic (congestive) heart failure: Secondary | ICD-10-CM | POA: Diagnosis not present

## 2018-11-07 DIAGNOSIS — N2581 Secondary hyperparathyroidism of renal origin: Secondary | ICD-10-CM | POA: Diagnosis not present

## 2018-11-07 DIAGNOSIS — N186 End stage renal disease: Secondary | ICD-10-CM | POA: Diagnosis not present

## 2018-11-08 ENCOUNTER — Telehealth: Payer: Self-pay | Admitting: Family

## 2018-11-08 NOTE — Telephone Encounter (Signed)
Received message from patient's son that patient is having issues with cramping during dialysis.  Asked that we reach out to patient.    Left detailed message on his home answering machine re: son's concern and advised pt to call our office to schedule a virtual visit to further discuss his concerns,

## 2018-11-09 DIAGNOSIS — I4891 Unspecified atrial fibrillation: Secondary | ICD-10-CM | POA: Diagnosis not present

## 2018-11-09 DIAGNOSIS — Z992 Dependence on renal dialysis: Secondary | ICD-10-CM | POA: Diagnosis not present

## 2018-11-09 DIAGNOSIS — D631 Anemia in chronic kidney disease: Secondary | ICD-10-CM | POA: Diagnosis not present

## 2018-11-09 DIAGNOSIS — N2581 Secondary hyperparathyroidism of renal origin: Secondary | ICD-10-CM | POA: Diagnosis not present

## 2018-11-09 DIAGNOSIS — N186 End stage renal disease: Secondary | ICD-10-CM | POA: Diagnosis not present

## 2018-11-09 DIAGNOSIS — I5042 Chronic combined systolic (congestive) and diastolic (congestive) heart failure: Secondary | ICD-10-CM | POA: Diagnosis not present

## 2018-11-10 DIAGNOSIS — Z992 Dependence on renal dialysis: Secondary | ICD-10-CM | POA: Diagnosis not present

## 2018-11-10 DIAGNOSIS — I4891 Unspecified atrial fibrillation: Secondary | ICD-10-CM | POA: Diagnosis not present

## 2018-11-10 DIAGNOSIS — N186 End stage renal disease: Secondary | ICD-10-CM | POA: Diagnosis not present

## 2018-11-10 DIAGNOSIS — I5042 Chronic combined systolic (congestive) and diastolic (congestive) heart failure: Secondary | ICD-10-CM | POA: Diagnosis not present

## 2018-11-10 DIAGNOSIS — D631 Anemia in chronic kidney disease: Secondary | ICD-10-CM | POA: Diagnosis not present

## 2018-11-10 DIAGNOSIS — N2581 Secondary hyperparathyroidism of renal origin: Secondary | ICD-10-CM | POA: Diagnosis not present

## 2018-11-12 ENCOUNTER — Ambulatory Visit (INDEPENDENT_AMBULATORY_CARE_PROVIDER_SITE_OTHER): Payer: Medicare Other | Admitting: Family

## 2018-11-12 DIAGNOSIS — N2581 Secondary hyperparathyroidism of renal origin: Secondary | ICD-10-CM | POA: Diagnosis not present

## 2018-11-12 DIAGNOSIS — B0229 Other postherpetic nervous system involvement: Secondary | ICD-10-CM

## 2018-11-12 DIAGNOSIS — R252 Cramp and spasm: Secondary | ICD-10-CM

## 2018-11-12 DIAGNOSIS — I5042 Chronic combined systolic (congestive) and diastolic (congestive) heart failure: Secondary | ICD-10-CM | POA: Diagnosis not present

## 2018-11-12 DIAGNOSIS — D631 Anemia in chronic kidney disease: Secondary | ICD-10-CM | POA: Diagnosis not present

## 2018-11-12 DIAGNOSIS — I4891 Unspecified atrial fibrillation: Secondary | ICD-10-CM | POA: Diagnosis not present

## 2018-11-12 DIAGNOSIS — N186 End stage renal disease: Secondary | ICD-10-CM | POA: Diagnosis not present

## 2018-11-12 DIAGNOSIS — Z992 Dependence on renal dialysis: Secondary | ICD-10-CM | POA: Diagnosis not present

## 2018-11-12 NOTE — Progress Notes (Signed)
Virtual Visit via telephone Note  I connected with Bruce Cole on 11/12/18 at 10:00 AM EDT This visit type was conducted due to national recommendations for restrictions regarding the COVID-19 Pandemic (e.g. social distancing).  The patient did not have access for a video visit. This format is felt to be most appropriate for this patient at this time.   I discussed the limitations of evaluation and management by telemedicine and the availability of in person appointments. The patient expressed understanding and agreed to proceed.  Only the patient and myself were on today's telephone visit. The patient was at hemodialysis and I was at home at the time of today's visit.   History of Present Illness:  Patient has several concerns today:  1) Cramping- Notes cramping for 2-3 hrs following each HD session, Reports that this started several weeks back. States that he has not discussed this with his nephrologist.  2) AF- INR is being managed by his nephrology group and we will plan to begin INR's in our office once the Covid Pandemic is over.  3) Post herpetic neuralgia- reports that he continues to have pain on the right hip where he had previous herpes outbreak. He continues lyrica.    Observations/Objective: Gen: Awake, alert, no acute distress Resp: Breathing sounds even and non-labored Psych: calm/pleasant demeanor Neuro: Alert and Oriented x 3,  Speech sounds clear.   Assessment and Plan:  1) AF- INR management per nephrology. Pt is aware of plan.  2) Post herpetic neuralgia- continue lyrica and advised trial of topical salon pas patches prn.  3) Cramping- post hemodialysis. I advised the patient to speak with his nephrologist about this to see if they can perhaps adjust his dry weight. Pt verbalizes understanding.   11 minutes spent on today's phone call with patient.  Follow Up Instructions:    I discussed the assessment and treatment plan with the patient. The patient was  provided an opportunity to ask questions and all were answered. The patient agreed with the plan and demonstrated an understanding of the instructions.   The patient was advised to call back or seek an in-person evaluation if the symptoms worsen or if the condition fails to improve as anticipated.    Nance Pear, NP

## 2018-11-13 ENCOUNTER — Emergency Department (HOSPITAL_COMMUNITY)
Admission: EM | Admit: 2018-11-13 | Discharge: 2018-11-13 | Disposition: A | Discharge status: Home / Self Care | Payer: Medicare Other | Attending: Emergency Medicine | Admitting: Emergency Medicine

## 2018-11-13 ENCOUNTER — Encounter (HOSPITAL_COMMUNITY): Payer: Self-pay | Admitting: *Deleted

## 2018-11-13 ENCOUNTER — Other Ambulatory Visit: Payer: Self-pay

## 2018-11-13 ENCOUNTER — Emergency Department (HOSPITAL_COMMUNITY): Payer: Medicare Other

## 2018-11-13 DIAGNOSIS — N186 End stage renal disease: Secondary | ICD-10-CM | POA: Insufficient documentation

## 2018-11-13 DIAGNOSIS — I5042 Chronic combined systolic (congestive) and diastolic (congestive) heart failure: Secondary | ICD-10-CM | POA: Insufficient documentation

## 2018-11-13 DIAGNOSIS — I959 Hypotension, unspecified: Secondary | ICD-10-CM | POA: Diagnosis not present

## 2018-11-13 DIAGNOSIS — Z992 Dependence on renal dialysis: Secondary | ICD-10-CM | POA: Insufficient documentation

## 2018-11-13 DIAGNOSIS — W07XXXA Fall from chair, initial encounter: Secondary | ICD-10-CM | POA: Diagnosis not present

## 2018-11-13 DIAGNOSIS — E119 Type 2 diabetes mellitus without complications: Secondary | ICD-10-CM | POA: Insufficient documentation

## 2018-11-13 DIAGNOSIS — I132 Hypertensive heart and chronic kidney disease with heart failure and with stage 5 chronic kidney disease, or end stage renal disease: Secondary | ICD-10-CM | POA: Insufficient documentation

## 2018-11-13 DIAGNOSIS — W19XXXA Unspecified fall, initial encounter: Secondary | ICD-10-CM | POA: Diagnosis not present

## 2018-11-13 DIAGNOSIS — M25552 Pain in left hip: Secondary | ICD-10-CM

## 2018-11-13 DIAGNOSIS — Z79899 Other long term (current) drug therapy: Secondary | ICD-10-CM | POA: Insufficient documentation

## 2018-11-13 DIAGNOSIS — R52 Pain, unspecified: Secondary | ICD-10-CM | POA: Diagnosis not present

## 2018-11-13 DIAGNOSIS — S79912A Unspecified injury of left hip, initial encounter: Secondary | ICD-10-CM | POA: Diagnosis not present

## 2018-11-13 LAB — CBC WITH DIFFERENTIAL/PLATELET
Abs Immature Granulocytes: 0.03 10*3/uL (ref 0.00–0.07)
Basophils Absolute: 0 10*3/uL (ref 0.0–0.1)
Basophils Relative: 0 %
Eosinophils Absolute: 0.2 10*3/uL (ref 0.0–0.5)
Eosinophils Relative: 2 %
HCT: 32.9 % — ABNORMAL LOW (ref 39.0–52.0)
Hemoglobin: 10.3 g/dL — ABNORMAL LOW (ref 13.0–17.0)
Immature Granulocytes: 0 %
Lymphocytes Relative: 10 %
Lymphs Abs: 0.9 10*3/uL (ref 0.7–4.0)
MCH: 32.1 pg (ref 26.0–34.0)
MCHC: 31.3 g/dL (ref 30.0–36.0)
MCV: 102.5 fL — ABNORMAL HIGH (ref 80.0–100.0)
Monocytes Absolute: 1.3 10*3/uL — ABNORMAL HIGH (ref 0.1–1.0)
Monocytes Relative: 13 %
Neutro Abs: 7 10*3/uL (ref 1.7–7.7)
Neutrophils Relative %: 75 %
Platelets: 146 10*3/uL — ABNORMAL LOW (ref 150–400)
RBC: 3.21 MIL/uL — ABNORMAL LOW (ref 4.22–5.81)
RDW: 18.9 % — ABNORMAL HIGH (ref 11.5–15.5)
WBC: 9.4 10*3/uL (ref 4.0–10.5)
nRBC: 0 % (ref 0.0–0.2)

## 2018-11-13 LAB — PROTIME-INR
INR: 1.6 — ABNORMAL HIGH (ref 0.8–1.2)
Prothrombin Time: 18.9 seconds — ABNORMAL HIGH (ref 11.4–15.2)

## 2018-11-13 LAB — BASIC METABOLIC PANEL
Anion gap: 12 (ref 5–15)
BUN: 25 mg/dL — ABNORMAL HIGH (ref 8–23)
CO2: 26 mmol/L (ref 22–32)
Calcium: 9.6 mg/dL (ref 8.9–10.3)
Chloride: 98 mmol/L (ref 98–111)
Creatinine, Ser: 6.94 mg/dL — ABNORMAL HIGH (ref 0.61–1.24)
GFR calc Af Amer: 8 mL/min — ABNORMAL LOW (ref >60)
GFR calc non Af Amer: 7 mL/min — ABNORMAL LOW (ref >60)
Glucose, Bld: 116 mg/dL — ABNORMAL HIGH (ref 70–99)
Potassium: 3.6 mmol/L (ref 3.5–5.1)
Sodium: 136 mmol/L (ref 135–145)

## 2018-11-13 MED ORDER — HYDROCODONE-ACETAMINOPHEN 5-325 MG PO TABS
1.0000 | ORAL_TABLET | Freq: Once | ORAL | Status: AC
  Administered 2018-11-13: 22:00:00 1 via ORAL
  Filled 2018-11-13: qty 1

## 2018-11-13 MED ORDER — FENTANYL CITRATE (PF) 100 MCG/2ML IJ SOLN
50.0000 ug | Freq: Once | INTRAMUSCULAR | Status: AC
  Administered 2018-11-13: 50 ug via INTRAVENOUS
  Filled 2018-11-13: qty 2

## 2018-11-13 NOTE — ED Notes (Signed)
Pt was able to get up from the stretcher and ambulate in the room with a walker without assist.  This nurse spoke to pt's son on the phone.  He will be picking the pt up from the ED.

## 2018-11-13 NOTE — ED Notes (Signed)
Patient transported to X-ray 

## 2018-11-13 NOTE — ED Notes (Signed)
Pt was able to put his pants on without assist.

## 2018-11-13 NOTE — Discharge Instructions (Signed)
You can take 1000 mg of Tylenol.  Do not exceed 4000 mg of Tylenol a day.  Follow up with your primary care doctor as directed.   Return emergency department for any worsening pain, difficulty walking or any other worsening concerning symptoms.

## 2018-11-13 NOTE — ED Provider Notes (Signed)
[facility] EMERGENCY DEPARTMENT Provider Note   CSN: [id number] Arrival date & time: 11/13/18  1834    History   Chief Complaint Chief Complaint  Patient presents with  . Fall    HPI Bruce Cole is a 78 y.o. male past medical history of atrial fibrillation, ESRD, GI bleed, heart murmur, brought in by EMS for evaluation of mechanical fall that occurred just prior to ED arrival.  Patient states that he was sitting on a low stool in his kitchen when he slipped off of the stool and onto the floor.  He states he did not hit his head or have any LOC.  He states that he landed on his buttocks and left hip.  He states he had pain in the left hip and he had to scoot on the floor to call EMS.  He reports that when EMS got there, they attempted to help him up but he was unable to bear full weight on his left lower extremity without significant pain.  He states he has had bilateral hip replacements with his left one done in 2004.  Patient states he is currently on Coumadin.  He has not had any vision changes, nausea/vomiting, numbness/weakness of his arms or legs.  He denies any preceding chest pain or dizziness, difficulty breathing, abdominal pain, neck pain, back pain.  His last dialysis appointment was yesterday.     The history is provided by the patient.    Past Medical History:  Diagnosis Date  . Acute blood loss anemia   . Atrial fibrillation (HCC)    Chronic  . Chronic combined systolic and diastolic heart failure ([place])   . Colon polyp 2000  . Dysrhythmia    hx  . ESRD on hemodialysis ([place])    Bruce Cole  . Essential hypertension   . Gastritis and gastroduodenitis   . GI bleed   . Gout   . Heart murmur   . History of blood transfusion   . History of kidney stones   . Nephrolithiasis   . OSA (obstructive sleep apnea) 09/02/2013    IMPRESSION :  1. Mild obstructive sleep apnea with hypopneas causing sleep fragmentation and moderate oxygen  desaturation.  2. Short runs of nonsustained VT were noted. His beta blocker may need to be titrated 3. Significant PLM's were noted, the PLM arousal index was low. Please correlate with clinical history of restless leg syndrome.  4. Sleep efficiency was poor.  RECOMMENDATION:  1. Treatment options for this degree of sleep disordered breathing include weight loss and positional therapy to avoid supine sleep 2. Consider titrating beta blocker further, defer to cardiologist 3. Patient should be cautioned against driving when sleepy.They should be asked to avoid medications with sedative side effects     . Osteoarthritis of right hip   . Pneumonia    hx 30 yrs ago  . Primary osteoarthritis of right hip   . Rheumatoid arthritis ([place])   . Thrombocytopenia [facility])     Patient Active Problem List   Diagnosis Date Noted  . High risk medication use 03/17/2017  . Hyperkalemia 08/01/2016  . Aftercare following surgery of the circulatory system 06/17/2016  . Status post total replacement of right hip 03/22/2016  . ESRD on dialysis ([place]) 11/27/2015  . OSA (obstructive sleep apnea) 09/02/2013  . Chronic combined systolic and diastolic heart failure ([place]) 03/16/2013  . Allergic rhinitis 12/18/2012  . Long term current use of anticoagulant therapy 11/25/2010  .  Genital herpes 05/31/2010  . DM (diabetes mellitus), type 2 with renal complications ([place]) 30/16/0109  . ERECTILE DYSFUNCTION, ORGANIC 09/21/2009  . Osteoarthritis 09/21/2009  . PERSONAL HX COLONIC POLYPS 08/25/2009  . Gout 07/22/2009  . Essential hypertension 07/22/2009  . ATRIAL FIBRILLATION 07/22/2009  . Rheumatoid arthritis ([place]) 07/22/2009  . NEPHROLITHIASIS, HX OF 07/22/2009    Past Surgical History:  Procedure Laterality Date  . AV FISTULA PLACEMENT Left 08/26/2015   Procedure: LEFT RADIOCEPHALIC FISTULA CREATION;  Surgeon: Rosetta Posner, MD;  Location: [facility];  Service: Vascular;  Laterality: Left;  . AV FISTULA PLACEMENT Left  11/23/2015   Procedure: ARTERIOVENOUS (AV) FISTULA CREATION;  Surgeon: Rosetta Posner, MD;  Location: [facility];  Service: Vascular;  Laterality: Left;  . BACK SURGERY     x2- discectomy  . CHOLECYSTECTOMY  1994  . CYSTOSCOPY/RETROGRADE/URETEROSCOPY/STONE EXTRACTION WITH BASKET    . ESOPHAGOGASTRODUODENOSCOPY N/A 11/13/2015   Procedure: ESOPHAGOGASTRODUODENOSCOPY (EGD);  Surgeon: Irene Shipper, MD;  Location: [facility] ENDOSCOPY;  Service: Endoscopy;  Laterality: N/A;  . INSERTION OF DIALYSIS CATHETER Left 11/23/2015   Procedure: INSERTION OF DIALYSIS CATHETER;  Surgeon: Rosetta Posner, MD;  Location: [facility];  Service: Vascular;  Laterality: Left;  . IR GENERIC HISTORICAL Left 08/04/2016   IR DIALY SHUNT INTRO NEEDLE/INTRACATH INITIAL W/IMG LEFT 08/04/2016 Markus Daft, MD [facility]-INTERV RAD  . IR GENERIC HISTORICAL  08/04/2016   IR US GUIDE VASC ACCESS LEFT 08/04/2016 Markus Daft, MD [facility]INTER[facility]  . JOINT REPLACEMENT     Total L-Hip replacement, Right Knee 10/20/09  . LEFT AND RIGHT HEART CATHETERIZATION WITH CORONARY ANGIOGRAM N/A 02/22/2013   Procedure: LEFT AND RIGHT HEART CATHETERIZATION WITH CORONARY ANGIOGRAM;  Surgeon: Jolaine Artist, MD;  Location: [facility] CATH LAB;  Service: Cardiovascular;  Laterality: N/A;  . LITHOTRIPSY  90's  . PERIPHERAL VASCULAR CATHETERIZATION Left 11/19/2015   Procedure: A/V/Fistulagram;  Surgeon: Conrad Netcong, MD;  Location: [facility] [place] [facility] CV LAB;  Service: Cardiovascular;  Laterality: Left;  . PERIPHERAL VASCULAR CATHETERIZATION Left 07/21/2016   Procedure: A/V Fistulagram;  Surgeon: Conrad [place]ville, MD;  Location: [facility] CV LAB;  Service: Cardiovascular;  Laterality: Left;  arm  . REVISON OF ARTERIOVENOUS FISTULA Left 05/30/2016   Procedure: REVISION LEFT UPPER ARM FISTULA;  Surgeon: Conrad Grayling, MD;  Location: [facility];  Service: Vascular;  Laterality: Left;  . REVISON OF ARTERIOVENOUS FISTULA Left 07/22/2016   Procedure: REVISON OF BASILIC VEIN TRANSPOSITION ANASTOMOSIS;  Surgeon: Rosetta Posner, MD;   Location: [place];  Service: Vascular;  Laterality: Left;  . SPINE SURGERY     x 2  . TOTAL HIP ARTHROPLASTY Right 03/22/2016   Procedure: RIGHT TOTAL HIP ARTHROPLASTY ANTERIOR APPROACH;  Surgeon: Mcarthur Rossetti, MD;  Location: [facility];  Service: Orthopedics;  Laterality: Right;        Home Medications    Prior to Admission medications   Medication Sig Start Date End Date Taking? Authorizing Provider  acetaminophen (TYLENOL) 500 MG tablet Take 500 mg by mouth every 6 (six) hours as needed for mild pain.    [provider]  albuterol (PROVENTIL) (2.5 MG/3ML) 0.083% nebulizer solution Take 2.5 mg by nebulization every 4 (four) hours as needed for shortness of breath. Reported on 09/25/2015    [provider]  atorvastatin (LIPITOR) 10 MG tablet TAKE 1 TABLET(10 MG) BY MOUTH DAILY 01/02/18   Debbrah Alar, NP  B Complex-C-Folic Acid (RENA-VITE RX) 1 MG TABS Take 1 tablet by mouth at  bedtime.  02/18/16   [provider]  colchicine 0.6 MG tablet Take 1/2 tablet by mouth once as needed for gout flare. Call if no improvement 01/02/18   Debbrah Alar, NP  fluticasone [facility]) 50 MCG/ACT nasal spray INSTILL 2 SPRAYS INTO BOTH NOSTRILS EVERY DAY 01/02/18   Debbrah Alar, NP  furosemide (LASIX) 40 MG tablet TAKE 1 TABLET(40 MG) BY MOUTH TWICE DAILY 10/03/17   Debbrah Alar, NP  hydroxychloroquine (PLAQUENIL) 200 MG tablet TAKE 1 TABLET(200 MG) BY MOUTH DAILY 10/09/18   Debbrah Alar, NP  lidocaine (LIDODERM) 5 % Place 1 patch onto the skin daily. Remove & Discard patch within 12 hours or as directed by MD 07/03/18   Debbrah Alar, NP  ondansetron (ZOFRAN ODT) 4 MG disintegrating tablet Take 1 tablet (4 mg total) by mouth every 8 (eight) hours as needed. 07/05/18   Isla Pence, MD  pantoprazole (PROTONIX) 40 MG tablet Take 1 tablet (40 mg total) by mouth daily. 10/09/18   Debbrah Alar, NP  pregabalin (LYRICA) 50 MG capsule Take 1 tab  by mouth once daily after hemodialysis 10/09/18   Debbrah Alar, NP  RENVELA 800 MG tablet Take 3 tablets (2,400 mg total) by mouth 3 (three) times daily with meals. 07/23/16   Lavina Hamman, MD  valACYclovir (VALTREX) 500 MG tablet Take 1 tablet by mouth every other day 08/30/18   Debbrah Alar, NP  warfarin (COUMADIN) 7.5 MG tablet TAKE 1 TABLET BY MOUTH DAILY AS DIRECTED BY COUMADIN CLINIC 09/11/18   Debbrah Alar, NP    Family History Family History  Problem Relation Age of Onset  . Hypertension Mother   . Arthritis Mother        ?RA  . Hypertension Father     Social History Social History   Tobacco Use  . Smoking status: Never Smoker  . Smokeless tobacco: Never Used  Substance Use Topics  . Alcohol use: No    Comment: occasional  . Drug use: No     Allergies   Ace inhibitors   Review of Systems Review of Systems  Respiratory: Negative for cough and shortness of breath.   Cardiovascular: Negative for chest pain.  Gastrointestinal: Negative for abdominal pain, nausea and vomiting.  Musculoskeletal:       LLE pain  Neurological: Negative for weakness, numbness and headaches.  All other systems reviewed and are negative.    Physical Exam Updated Vital Signs BP 110/66   Pulse (!) 57   Temp 99.2 F (37.3 C) (Oral)   Resp 18   Ht 6\' 3"  (1.905 m)   Wt 113.4 kg   SpO2 96%   BMI 31.25 kg/m   Physical Exam Vitals signs and nursing note reviewed.  Constitutional:      Appearance: Normal appearance. He is well-developed.  HENT:     Head: Normocephalic and atraumatic.     Comments: No tenderness to palpation of skull. No deformities or crepitus noted. No open wounds, abrasions or lacerations.  Eyes:     General: Lids are normal.     Conjunctiva/sclera: Conjunctivae normal.     Pupils: Pupils are equal, round, and reactive to light.     Comments: PERRL. EOMs intact.   Neck:     Musculoskeletal: Full passive range of motion without pain.      Comments: Full flexion/extension and lateral movement of neck fully intact. No bony midline tenderness. No deformities or crepitus.  Cardiovascular:     Rate and Rhythm: Normal rate and  regular rhythm.     Pulses: Normal pulses.          Dorsalis pedis pulses are 2+ on the right side.     Heart sounds: Normal heart sounds. No murmur. No friction rub. No gallop.   Pulmonary:     Effort: Pulmonary effort is normal.     Breath sounds: Normal breath sounds.     Comments: Lungs clear to auscultation bilaterally.  Symmetric chest rise.  No wheezing, rales, rhonchi. Abdominal:     Palpations: Abdomen is soft. Abdomen is not rigid.     Tenderness: There is no abdominal tenderness. There is no guarding.  Musculoskeletal: Normal range of motion.     Thoracic back: He exhibits no tenderness.     Lumbar back: He exhibits no tenderness.     Comments: Tenderness palpation noted to left hip.  No deformity or crepitus noted.  No rotational shortening of left lower extremity.  No tenderness palpation noted to right knee, right tib-fib, right ankle.  No tenderness palpation in left lower extremity.  Full range of motion of left lower extremity without any difficulty.  No midline T or L-spine tenderness.   Skin:    General: Skin is warm and dry.     Capillary Refill: Capillary refill takes less than 2 seconds.  Neurological:     Mental Status: He is alert and oriented to person, place, and time.     Comments: Cranial nerves III-XII intact Follows commands, Moves all extremities  5/5 strength to BUE and BLE  Sensation intact throughout all major nerve distributions No slurred speech. No facial droop.   Psychiatric:        Speech: Speech normal.      ED Treatments / Results  Labs (all labs ordered are listed, but only abnormal results are displayed) Labs Reviewed  BASIC METABOLIC PANEL - Abnormal; Notable for the following components:      Result Value   Glucose, Bld 116 (*)    BUN 25 (*)     Creatinine, Ser 6.94 (*)    GFR calc non Af Amer 7 (*)    GFR calc Af Amer 8 (*)    All other components within normal limits  CBC WITH DIFFERENTIAL/PLATELET - Abnormal; Notable for the following components:   RBC 3.21 (*)    Hemoglobin 10.3 (*)    HCT 32.9 (*)    MCV 102.5 (*)    RDW 18.9 (*)    Platelets 146 (*)    Monocytes Absolute 1.3 (*)    All other components within normal limits  PROTIME-INR - Abnormal; Notable for the following components:   Prothrombin Time 18.9 (*)    INR 1.6 (*)    All other components within normal limits    EKG None  Radiology Dg Hip Unilat W Or Wo Pelvis 2-3 Views Left  Result Date: 11/13/2018 CLINICAL DATA:  79 year old male with hip pain after fall. EXAM: DG HIP (WITH OR WITHOUT PELVIS) 2-3V LEFT COMPARISON:  CT Abdomen and Pelvis 07/05/2018. Right hip series 06/12/2018. FINDINGS: Bilateral total hip arthroplasty. The bilateral hardware appears normally aligned in the AP view. Frogleg lateral views of the left hip demonstrate normal hardware alignment. No hardware failure identified. The entire right femoral component is not included. Osteopenia. No acute fracture of the pelvis or proximal left femur identified. Calcified atherosclerosis. Negative visible bowel gas pattern. IMPRESSION: 1. No acute fracture or dislocation identified about the left hip or pelvis. 2. Visible hardware intact. Electronically  Signed   By: Genevie Ann M.D.   On: 11/13/2018 19:25    Procedures Procedures (including critical care time)  Medications Ordered in ED Medications  fentaNYL (SUBLIMAZE) injection 50 mcg (50 mcg Intravenous Given 11/13/18 2032)  HYDROcodone-acetaminophen (NORCO/VICODIN) 5-325 MG per tablet 1 tablet (1 tablet Oral Given 11/13/18 2145)     Initial Impression / Assessment and Plan / ED Course  I have reviewed the triage vital signs and the nursing notes.  Pertinent labs & imaging results that were available during my care of the patient were reviewed  by me and considered in my medical decision making (see chart for details).        78 year old male who presents for evaluation of left hip pain status post mechanical fall.  He reports he was sitting on a three-foot stool and slid down and hit the floor, landing on his hip.  No head injury, LOC.  He is on Coumadin but states he did not his head.  No vomiting, numbness/weakness of his arms or legs.  Patient states that he had difficulty trying to ambulate on his leg since incident.  Patient is afebrile with no signs of distress.  Stable.  He is slightly hypertensive and bradycardic.  Per review of records, this is consistent with his normal vitals.  On exam, tenderness palpation noted to left hip.  No deformity or crepitus noted.  No shortening or rotation.  Normal neuro exam.  Patient states he slid off of a stool onto the floor.  He did not hit his head.  no indication for CT head imaging at this time.  Do not suspect intracranial hemorrhage at this time.  Will plan for imaging of hip. Discussed patient with Dr. Tamera Punt who agrees with plan.   INR is 1.6.  BMP shows BUN of 25 and a creatinine of 6.94.  CBC shows hemoglobin of 10.3.  X-rays reviewed.  Negative for any acute bony abnormality.  No evidence of fracture or dislocation.  Discussed results with patient.  Will attempt to ambulate here in ED.  Patient reports feeling better after pain medications.  He ambulated with the assistance of a walker which is baseline.  Patient states that he feels comfortable going home.  Encourage at home supportive care measures. At this time, patient exhibits no emergent life-threatening condition that require further evaluation in ED or admission. Patient had ample opportunity for questions and discussion. All patient's questions were answered with full understanding. Strict return precautions discussed. Patient expresses understanding and agreement to plan.   Portions of this note were generated with Geographical information systems officer. Dictation errors may occur despite best attempts at proofreading.   Final Clinical Impressions(s) / ED Diagnoses   Final diagnoses:  Fall, initial encounter  Pain of left hip joint    ED Discharge Orders    None       Desma Mcgregor 11/13/18 2324    Malvin Johns, MD 11/13/18 [phone]

## 2018-11-13 NOTE — ED Triage Notes (Signed)
Pt was sitting on a low stool and slipped to floor today this afternoon the  EMS  Helped  Pt to his feet.  Pt called EMS later to get help because  His Lt hip replacement felt  Like it was out of joint

## 2018-11-14 DIAGNOSIS — N2581 Secondary hyperparathyroidism of renal origin: Secondary | ICD-10-CM | POA: Diagnosis not present

## 2018-11-14 DIAGNOSIS — I4891 Unspecified atrial fibrillation: Secondary | ICD-10-CM | POA: Diagnosis not present

## 2018-11-14 DIAGNOSIS — D631 Anemia in chronic kidney disease: Secondary | ICD-10-CM | POA: Diagnosis not present

## 2018-11-14 DIAGNOSIS — N186 End stage renal disease: Secondary | ICD-10-CM | POA: Diagnosis not present

## 2018-11-14 DIAGNOSIS — Z992 Dependence on renal dialysis: Secondary | ICD-10-CM | POA: Diagnosis not present

## 2018-11-14 DIAGNOSIS — I5042 Chronic combined systolic (congestive) and diastolic (congestive) heart failure: Secondary | ICD-10-CM | POA: Diagnosis not present

## 2018-11-16 ENCOUNTER — Telehealth: Payer: Self-pay

## 2018-11-16 DIAGNOSIS — I132 Hypertensive heart and chronic kidney disease with heart failure and with stage 5 chronic kidney disease, or end stage renal disease: Secondary | ICD-10-CM | POA: Diagnosis not present

## 2018-11-16 DIAGNOSIS — N2581 Secondary hyperparathyroidism of renal origin: Secondary | ICD-10-CM | POA: Diagnosis not present

## 2018-11-16 DIAGNOSIS — I158 Other secondary hypertension: Secondary | ICD-10-CM | POA: Diagnosis not present

## 2018-11-16 DIAGNOSIS — E8779 Other fluid overload: Secondary | ICD-10-CM | POA: Diagnosis not present

## 2018-11-16 DIAGNOSIS — N186 End stage renal disease: Secondary | ICD-10-CM | POA: Diagnosis not present

## 2018-11-16 DIAGNOSIS — I4891 Unspecified atrial fibrillation: Secondary | ICD-10-CM | POA: Diagnosis not present

## 2018-11-16 DIAGNOSIS — Z992 Dependence on renal dialysis: Secondary | ICD-10-CM | POA: Diagnosis not present

## 2018-11-16 DIAGNOSIS — D631 Anemia in chronic kidney disease: Secondary | ICD-10-CM | POA: Diagnosis not present

## 2018-11-16 DIAGNOSIS — I5042 Chronic combined systolic (congestive) and diastolic (congestive) heart failure: Secondary | ICD-10-CM | POA: Diagnosis not present

## 2018-11-16 DIAGNOSIS — W19XXXA Unspecified fall, initial encounter: Secondary | ICD-10-CM

## 2018-11-16 NOTE — Telephone Encounter (Signed)
Copied from [place] [phone]. Topic: Referral - Request for Referral >> Nov 15, 2018 11:47 AM Scherrie Gerlach wrote: Jodi Geralds , social worker with dialysis states pt called there yesterday requesting home health referral for help in the home.  Pt goes there for dialysis. Pt reports falling a lot. Would like to get some assistance in the home. Pt went to ED 4/28, and had telephone visit with [facility] 4/27. Tess states this referral usually comes from the pcp and she is calling to relay this information.

## 2018-11-17 DIAGNOSIS — I4891 Unspecified atrial fibrillation: Secondary | ICD-10-CM | POA: Diagnosis not present

## 2018-11-17 DIAGNOSIS — I5042 Chronic combined systolic (congestive) and diastolic (congestive) heart failure: Secondary | ICD-10-CM | POA: Diagnosis not present

## 2018-11-17 DIAGNOSIS — D631 Anemia in chronic kidney disease: Secondary | ICD-10-CM | POA: Diagnosis not present

## 2018-11-17 DIAGNOSIS — N2581 Secondary hyperparathyroidism of renal origin: Secondary | ICD-10-CM | POA: Diagnosis not present

## 2018-11-17 DIAGNOSIS — N186 End stage renal disease: Secondary | ICD-10-CM | POA: Diagnosis not present

## 2018-11-17 DIAGNOSIS — Z992 Dependence on renal dialysis: Secondary | ICD-10-CM | POA: Diagnosis not present

## 2018-11-19 DIAGNOSIS — N186 End stage renal disease: Secondary | ICD-10-CM | POA: Diagnosis not present

## 2018-11-19 DIAGNOSIS — I4891 Unspecified atrial fibrillation: Secondary | ICD-10-CM | POA: Diagnosis not present

## 2018-11-19 DIAGNOSIS — D631 Anemia in chronic kidney disease: Secondary | ICD-10-CM | POA: Diagnosis not present

## 2018-11-19 DIAGNOSIS — N2581 Secondary hyperparathyroidism of renal origin: Secondary | ICD-10-CM | POA: Diagnosis not present

## 2018-11-19 DIAGNOSIS — Z992 Dependence on renal dialysis: Secondary | ICD-10-CM | POA: Diagnosis not present

## 2018-11-19 DIAGNOSIS — I5042 Chronic combined systolic (congestive) and diastolic (congestive) heart failure: Secondary | ICD-10-CM | POA: Diagnosis not present

## 2018-11-20 NOTE — Telephone Encounter (Signed)
Referral placed.

## 2018-11-21 DIAGNOSIS — N2581 Secondary hyperparathyroidism of renal origin: Secondary | ICD-10-CM | POA: Diagnosis not present

## 2018-11-21 DIAGNOSIS — D631 Anemia in chronic kidney disease: Secondary | ICD-10-CM | POA: Diagnosis not present

## 2018-11-21 DIAGNOSIS — Z992 Dependence on renal dialysis: Secondary | ICD-10-CM | POA: Diagnosis not present

## 2018-11-21 DIAGNOSIS — I4891 Unspecified atrial fibrillation: Secondary | ICD-10-CM | POA: Diagnosis not present

## 2018-11-21 DIAGNOSIS — I5042 Chronic combined systolic (congestive) and diastolic (congestive) heart failure: Secondary | ICD-10-CM | POA: Diagnosis not present

## 2018-11-21 DIAGNOSIS — N186 End stage renal disease: Secondary | ICD-10-CM | POA: Diagnosis not present

## 2018-11-23 DIAGNOSIS — Z992 Dependence on renal dialysis: Secondary | ICD-10-CM | POA: Diagnosis not present

## 2018-11-23 DIAGNOSIS — N186 End stage renal disease: Secondary | ICD-10-CM | POA: Diagnosis not present

## 2018-11-23 DIAGNOSIS — N2581 Secondary hyperparathyroidism of renal origin: Secondary | ICD-10-CM | POA: Diagnosis not present

## 2018-11-23 DIAGNOSIS — D631 Anemia in chronic kidney disease: Secondary | ICD-10-CM | POA: Diagnosis not present

## 2018-11-23 DIAGNOSIS — I5042 Chronic combined systolic (congestive) and diastolic (congestive) heart failure: Secondary | ICD-10-CM | POA: Diagnosis not present

## 2018-11-23 DIAGNOSIS — I4891 Unspecified atrial fibrillation: Secondary | ICD-10-CM | POA: Diagnosis not present

## 2018-11-23 NOTE — Telephone Encounter (Signed)
Caller name: Roselie Awkward Relation to pt: PT from [place]  Call back number: [phone]   Reason for call:  Requesting verbal orders for start of care for Tuesday 11/27/2018, patient had dialysis and feels tired ,please advise

## 2018-11-26 DIAGNOSIS — Z992 Dependence on renal dialysis: Secondary | ICD-10-CM | POA: Diagnosis not present

## 2018-11-26 DIAGNOSIS — I4891 Unspecified atrial fibrillation: Secondary | ICD-10-CM | POA: Diagnosis not present

## 2018-11-26 DIAGNOSIS — I5042 Chronic combined systolic (congestive) and diastolic (congestive) heart failure: Secondary | ICD-10-CM | POA: Diagnosis not present

## 2018-11-26 DIAGNOSIS — N186 End stage renal disease: Secondary | ICD-10-CM | POA: Diagnosis not present

## 2018-11-26 DIAGNOSIS — N2581 Secondary hyperparathyroidism of renal origin: Secondary | ICD-10-CM | POA: Diagnosis not present

## 2018-11-26 DIAGNOSIS — D631 Anemia in chronic kidney disease: Secondary | ICD-10-CM | POA: Diagnosis not present

## 2018-11-27 ENCOUNTER — Telehealth: Payer: Self-pay | Admitting: Family

## 2018-11-27 DIAGNOSIS — Z7901 Long term (current) use of anticoagulants: Secondary | ICD-10-CM | POA: Diagnosis not present

## 2018-11-27 DIAGNOSIS — M069 Rheumatoid arthritis, unspecified: Secondary | ICD-10-CM | POA: Diagnosis not present

## 2018-11-27 DIAGNOSIS — I4891 Unspecified atrial fibrillation: Secondary | ICD-10-CM | POA: Diagnosis not present

## 2018-11-27 DIAGNOSIS — N186 End stage renal disease: Secondary | ICD-10-CM | POA: Diagnosis not present

## 2018-11-27 DIAGNOSIS — I130 Hypertensive heart and chronic kidney disease with heart failure and stage 1 through stage 4 chronic kidney disease, or unspecified chronic kidney disease: Secondary | ICD-10-CM | POA: Diagnosis not present

## 2018-11-27 DIAGNOSIS — M25552 Pain in left hip: Secondary | ICD-10-CM | POA: Diagnosis not present

## 2018-11-27 DIAGNOSIS — Z87442 Personal history of urinary calculi: Secondary | ICD-10-CM | POA: Diagnosis not present

## 2018-11-27 DIAGNOSIS — Z96642 Presence of left artificial hip joint: Secondary | ICD-10-CM | POA: Diagnosis not present

## 2018-11-27 DIAGNOSIS — I5042 Chronic combined systolic (congestive) and diastolic (congestive) heart failure: Secondary | ICD-10-CM | POA: Diagnosis not present

## 2018-11-27 DIAGNOSIS — E1122 Type 2 diabetes mellitus with diabetic chronic kidney disease: Secondary | ICD-10-CM | POA: Diagnosis not present

## 2018-11-27 DIAGNOSIS — W07XXXD Fall from chair, subsequent encounter: Secondary | ICD-10-CM | POA: Diagnosis not present

## 2018-11-27 DIAGNOSIS — Z96641 Presence of right artificial hip joint: Secondary | ICD-10-CM | POA: Diagnosis not present

## 2018-11-27 DIAGNOSIS — E1129 Type 2 diabetes mellitus with other diabetic kidney complication: Secondary | ICD-10-CM | POA: Diagnosis not present

## 2018-11-27 DIAGNOSIS — M1990 Unspecified osteoarthritis, unspecified site: Secondary | ICD-10-CM | POA: Diagnosis not present

## 2018-11-27 DIAGNOSIS — Z992 Dependence on renal dialysis: Secondary | ICD-10-CM | POA: Diagnosis not present

## 2018-11-27 DIAGNOSIS — G4733 Obstructive sleep apnea (adult) (pediatric): Secondary | ICD-10-CM | POA: Diagnosis not present

## 2018-11-27 MED ORDER — VALACYCLOVIR HCL 500 MG PO TABS: ORAL_TABLET | ORAL | 1 refills | Status: DC

## 2018-11-27 NOTE — Telephone Encounter (Signed)
Copied from [place] [phone]. Topic: General - Other >> Nov 27, 2018 11:30 AM Leward Quan A wrote: Reason for CRM: Patient called for [place] he was a little annoyed because he stated that he have been trying to get a home health aide to help in his home since falling off a stool. He also states that he is not able to get any of his medications from the pharmacy. Patient did not give names of medication that he need refilled. Asking for a call back ASAP please. Ph# [phone] or [phone]   I spoke to pt. Explained to him that medicare does not typically cover a home health aid. That this would be an out of pocket cost. I did send PT/OT out to the home. PT "Hilliard Clark" was there when I called. He plans to do PT 2-3 times a week. He will double check on insurance coverage with his company for an aid.   Pt request refill on valtrex. Reports other meds are ok. Refill sent.

## 2018-11-28 DIAGNOSIS — D631 Anemia in chronic kidney disease: Secondary | ICD-10-CM | POA: Diagnosis not present

## 2018-11-28 DIAGNOSIS — N2581 Secondary hyperparathyroidism of renal origin: Secondary | ICD-10-CM | POA: Diagnosis not present

## 2018-11-28 DIAGNOSIS — N186 End stage renal disease: Secondary | ICD-10-CM | POA: Diagnosis not present

## 2018-11-28 DIAGNOSIS — Z992 Dependence on renal dialysis: Secondary | ICD-10-CM | POA: Diagnosis not present

## 2018-11-28 DIAGNOSIS — I4891 Unspecified atrial fibrillation: Secondary | ICD-10-CM | POA: Diagnosis not present

## 2018-11-28 DIAGNOSIS — I5042 Chronic combined systolic (congestive) and diastolic (congestive) heart failure: Secondary | ICD-10-CM | POA: Diagnosis not present

## 2018-11-29 ENCOUNTER — Telehealth: Payer: Self-pay | Admitting: Family

## 2018-11-29 NOTE — Telephone Encounter (Signed)
Copied from [place] [phone]. Topic: General - Other >> Nov 29, 2018 10:45 AM Celene Kras A wrote: Reason for CRM: Leron Croak, from [facility] home [facility], called stating he is concerned about the medications hydroxychloroquine and ondansetron may combat each other. He also states pt refused Home health aid because pt believed they would clean his house. After being told the home health aid would help bathe him but not clean his house he refused them. Please advise.

## 2018-11-30 DIAGNOSIS — I5042 Chronic combined systolic (congestive) and diastolic (congestive) heart failure: Secondary | ICD-10-CM | POA: Diagnosis not present

## 2018-11-30 DIAGNOSIS — I4891 Unspecified atrial fibrillation: Secondary | ICD-10-CM | POA: Diagnosis not present

## 2018-11-30 DIAGNOSIS — D631 Anemia in chronic kidney disease: Secondary | ICD-10-CM | POA: Diagnosis not present

## 2018-11-30 DIAGNOSIS — Z992 Dependence on renal dialysis: Secondary | ICD-10-CM | POA: Diagnosis not present

## 2018-11-30 DIAGNOSIS — N2581 Secondary hyperparathyroidism of renal origin: Secondary | ICD-10-CM | POA: Diagnosis not present

## 2018-11-30 DIAGNOSIS — N186 End stage renal disease: Secondary | ICD-10-CM | POA: Diagnosis not present

## 2018-11-30 NOTE — Progress Notes (Signed)
Virtual Visit via Video Note  I connected with patient on 12/03/18 at  2:00 PM EDT by a video enabled telemedicine application and verified that I am speaking with the correct person using two identifiers.   THIS ENCOUNTER IS A VIRTUAL VISIT DUE TO COVID-19 - PATIENT WAS NOT SEEN IN THE OFFICE. PATIENT HAS CONSENTED TO VIRTUAL VISIT / TELEMEDICINE VISIT   Location of patient: home  Location of provider: office  I discussed the limitations of evaluation and management by telemedicine and the availability of in person appointments. The patient expressed understanding and agreed to proceed.   Subjective:   Bruce Cole is a 78 y.o. male who presents for Medicare Annual/Subsequent preventive examination.  On Dialysis M-W-F. Takes [facility] transportation.  Review of Systems: No ROS.  Medicare Wellness Virtual Visit.  Visual/audio telehealth visit, UTA vital signs.   See social history for additional risk factors.   Sleep patterns: Sleep schedule is off per pt. Not sure why. Home Safety/Smoke Alarms: Feels safe in home. Smoke alarms in place.  Lives alone in 2 story townhouse. Shower bench. Master on 1st.  Male:   CCS- due 2021    PSA-  Lab Results  Component Value Date   PSA 0.58 01/30/2012   PSA 0.43 07/24/2009       Objective:    Advanced Directives 12/03/2018 11/13/2018 10/29/2018 07/05/2018 01/23/2018 12/23/2017 11/30/2017  Does Patient Have a Medical Advance Directive? No No No No Yes No Yes  Type of Advance Directive - - - - Press photographer;Living will - [place];Living will  Does patient want to make changes to medical advance directive? - - - - No - Patient declined - -  Copy of [place] in Chart? - - - - No - copy requested - No - copy requested  Would patient like information on creating a medical advance directive? No - Patient declined Yes (ED - Information included in AVS) No - Patient declined No - Patient declined -  - -    Tobacco Social History   Tobacco Use  Smoking Status Never Smoker  Smokeless Tobacco Never Used     Counseling given: Not Answered   Clinical Intake: Pain : No/denies pain    Past Medical History:  Diagnosis Date  . Acute blood loss anemia   . Atrial fibrillation (HCC)    Chronic  . Chronic combined systolic and diastolic heart failure ([place])   . Colon polyp 2000  . Dysrhythmia    hx  . ESRD on hemodialysis ([place])    [place]  . Essential hypertension   . Gastritis and gastroduodenitis   . GI bleed   . Gout   . Heart murmur   . History of blood transfusion   . History of kidney stones   . Nephrolithiasis   . OSA (obstructive sleep apnea) 09/02/2013    IMPRESSION :  1. Mild obstructive sleep apnea with hypopneas causing sleep fragmentation and moderate oxygen desaturation.  2. Short runs of nonsustained VT were noted. His beta blocker may need to be titrated 3. Significant PLM's were noted, the PLM arousal index was low. Please correlate with clinical history of restless leg syndrome.  4. Sleep efficiency was poor.  RECOMMENDATION:  1. Treatment options for this degree of sleep disordered breathing include weight loss and positional therapy to avoid supine sleep 2. Consider titrating beta blocker further, defer to cardiologist 3. Patient should be cautioned against driving when  sleepy.They should be asked to avoid medications with sedative side effects     . Osteoarthritis of right hip   . Pneumonia    hx 30 yrs ago  . Primary osteoarthritis of right hip   . Rheumatoid arthritis ([place])   . Thrombocytopenia ([place])    Past Surgical History:  Procedure Laterality Date  . AV FISTULA PLACEMENT Left 08/26/2015   Procedure: LEFT RADIOCEPHALIC FISTULA CREATION;  Surgeon: Rosetta Posner, MD;  Location: [place];  Service: Vascular;  Laterality: Left;  . AV FISTULA PLACEMENT Left 11/23/2015   Procedure: ARTERIOVENOUS (AV) FISTULA CREATION;  Surgeon: Rosetta Posner,  MD;  Location: [place];  Service: Vascular;  Laterality: Left;  . BACK SURGERY     x2- discectomy  . CHOLECYSTECTOMY  1994  . CYSTOSCOPY/RETROGRADE/URETEROSCOPY/STONE EXTRACTION WITH BASKET    . ESOPHAGOGASTRODUODENOSCOPY N/A 11/13/2015   Procedure: ESOPHAGOGASTRODUODENOSCOPY (EGD);  Surgeon: Irene Shipper, MD;  Location: [facility] END[facility]COPY;  Service: Endoscopy;  Laterality: N/A;  . INSERTION OF DIALYSIS CATHETER Left 11/23/2015   Procedure: INSERTION OF DIALYSIS CATHETER;  Surgeon: Rosetta Posner, MD;  Location: [place];  Service: Vascular;  Laterality: Left;  . IR GENERIC HISTORICAL Left 08/04/2016   IR DIALY SHUNT INTRO NEEDLE/INTRACATH INITIAL W/IMG LEFT 08/04/2016 Markus Daft, MD [facility]-INTER[facility] RAD  . IR GENERIC HISTORICAL  08/04/2016   IR US GUIDE VASC ACCESS LEFT 08/04/2016 Markus Daft, MD [facility]  . JOINT REPLACEMENT     Total L-Hip replacement, Right Knee 10/20/09  . LEFT AND RIGHT HEART CATHETERIZATION WITH CORONARY ANGIOGRAM N/A 02/22/2013   Procedure: LEFT AND RIGHT HEART CATHETERIZATION WITH CORONARY ANGIOGRAM;  Surgeon: Jolaine Artist, MD;  Location: [facility] CATH LAB;  Service: Cardiovascular;  Laterality: N/A;  . LITHOTRIPSY  90's  . PERIPHERAL VASCULAR CATHETERIZATION Left 11/19/2015   Procedure: A/V/Fistulagram;  Surgeon: Conrad [place], MD;  Location: [place] CV LAB;  Service: Cardiovascular;  Laterality: Left;  . PERIPHERAL VASCULAR CATHETERIZATION Left 07/21/2016   Procedure: A/V Fistulagram;  Surgeon: Conrad Manchester, MD;  Location: [place] CV LAB;  Service: Cardiovascular;  Laterality: Left;  arm  . REVISON OF ARTERIOVENOUS FISTULA Left 05/30/2016   Procedure: REVISION LEFT UPPER ARM FISTULA;  Surgeon: Conrad , MD;  Location: [place];  Service: Vascular;  Laterality: Left;  . REVISON OF ARTERIOVENOUS FISTULA Left 07/22/2016   Procedure: REVISON OF BASILIC VEIN TRANSPOSITION ANASTOMOSIS;  Surgeon: Rosetta Posner, MD;  Location: [place];  Service: Vascular;  Laterality: Left;  . SPINE SURGERY     x 2   . TOTAL HIP ARTHROPLASTY Right 03/22/2016   Procedure: RIGHT TOTAL HIP ARTHROPLASTY ANTERIOR APPROACH;  Surgeon: Mcarthur Rossetti, MD;  Location: [place];  Service: Orthopedics;  Laterality: Right;   Family History  Problem Relation Age of Onset  . Hypertension Mother   . Arthritis Mother        ?RA  . Hypertension Father    Social History   Socioeconomic History  . Marital status: Single    Spouse name: Not on file  . Number of children: 3  . Years of education: Not on file  . Highest education level: Not on file  Occupational History  . Occupation: retired Personnel officer: RETIRED  Social Needs  . Financial resource strain: Not on file  . Food insecurity:    Worry: Not on file    Inability: Not on file  . Transportation needs:    Medical: Not  on file    Non-medical: Not on file  Tobacco Use  . Smoking status: Never Smoker  . Smokeless tobacco: Never Used  Substance and Sexual Activity  . Alcohol use: No    Comment: occasional  . Drug use: No  . Sexual activity: Not on file  Lifestyle  . Physical activity:    Days per week: Not on file    Minutes per session: Not on file  . Stress: Not on file  Relationships  . Social connections:    Talks on phone: Not on file    Gets together: Not on file    Attends religious service: Not on file    Active member of club or organization: Not on file    Attends meetings of clubs or organizations: Not on file    Relationship status: Not on file  Other Topics Concern  . Not on file  Social History Narrative   Former [employer]   Admitted to [facility] 11/25/15   Widowed   Never smoked   FULL CODE    Outpatient Encounter Medications as of 12/03/2018  Medication Sig  . acetaminophen (TYLENOL) 500 MG tablet Take 500 mg by mouth every 6 (six) hours as needed for mild pain.  [name] albuterol (PROVENTIL) (2.5 MG/3ML) 0.083% nebulizer solution Take 2.5 mg by nebulization every 4 (four) hours  as needed for shortness of breath. Reported on 09/25/2015  . atorvastatin (LIPITOR) 10 MG tablet TAKE 1 TABLET(10 MG) BY MOUTH DAILY  . B Complex-C-Folic Acid (RENA-VITE RX) 1 MG TABS Take 1 tablet by mouth at bedtime.   . colchicine 0.6 MG tablet Take 1/2 tablet by mouth once as needed for gout flare. Call if no improvement  . pregabalin (LYRICA) 50 MG capsule Take 1 tab by mouth once daily after hemodialysis  . RENVELA 800 MG tablet Take 3 tablets (2,400 mg total) by mouth 3 (three) times daily with meals.  . valACYclovir (VALTREX) 500 MG tablet Take 1 tablet by mouth every other day  . warfarin (COUMADIN) 7.5 MG tablet TAKE 1 TABLET BY MOUTH DAILY AS DIRECTED BY COUMADIN CLINIC  . fluticasone (FLONASE) 50 MCG/ACT nasal spray INSTILL 2 SPRAYS INTO BOTH NOSTRILS EVERY DAY (Patient not taking: Reported on 12/03/2018)  . furosemide (LASIX) 40 MG tablet TAKE 1 TABLET(40 MG) BY MOUTH TWICE DAILY (Patient not taking: Reported on 12/03/2018)  . hydroxychloroquine (PLAQUENIL) 200 MG tablet TAKE 1 TABLET(200 MG) BY MOUTH DAILY (Patient not taking: Reported on 12/03/2018)  . lidocaine (LIDODERM) 5 % Place 1 patch onto the skin daily. Remove & Discard patch within 12 hours or as directed by MD (Patient not taking: Reported on 12/03/2018)  . pantoprazole (PROTONIX) 40 MG tablet Take 1 tablet (40 mg total) by mouth daily. (Patient not taking: Reported on 12/03/2018)  . [DISCONTINUED] ondansetron (ZOFRAN ODT) 4 MG disintegrating tablet Take 1 tablet (4 mg total) by mouth every 8 (eight) hours as needed. (Patient not taking: Reported on 12/03/2018)   No facility-administered encounter medications on file as of 12/03/2018.     Activities of Daily Living In your present state of health, do you have any difficulty performing the following activities: 12/03/2018  Hearing? N  Vision? N  Difficulty concentrating or making decisions? Y  Walking or climbing stairs? N  Dressing or bathing? Y  Comment just washes up at  sink.  Doing errands, shopping? N  Preparing Food and eating ? N  Using the Toilet? N  In  the past six months, have you accidently leaked urine? Y  Comment On HD  Do you have problems with loss of bowel control? N  Managing your Medications? N  Managing your Finances? N  Housekeeping or managing your Housekeeping? Y  Comment housekeeper 2x/month  Some recent data might be hidden    Patient Care Team: Debbrah Alar, NP as PCP - General (Internal Medicine) Estanislado Emms, MD as Consulting Physician (Nephrology) Mcarthur Rossetti, MD as Consulting Physician (Orthopedic Surgery) Bensimhon, Shaune Pascal, MD as Consulting Physician (Cardiology) [place]   Assessment:   This is a routine wellness examination for [place]. Physical assessment deferred to PCP.  Exercise Activities and Dietary recommendations Current Exercise Habits: Home exercise routine(PT), Time (Minutes): 30, Frequency (Times/Week): 2, Weekly Exercise (Minutes/Week): 60, Exercise limited by: None identified Diet (meal preparation, eat out, water intake, caffeinated beverages, dairy products, fruits and vegetables): well balanced     Goals    . eating well       Fall Risk Fall Risk  12/03/2018 01/02/2018 11/30/2017 07/04/2017 06/22/2017  Falls in the past year? 1 Yes No No No  Comment - - - - Emmi Telephone Survey: data to providers prior to load  Number falls in past yr: 1 1 - - -  Injury with Fall? 1 (No Data) - - -  Comment - Lost his toenail during the fall - - -  Risk Factor Category  - - - - -  Risk for fall due to : History of fall(s) - - - -  Follow up - - - - -    Depression Screen PHQ 2/9 Scores 12/03/2018 11/30/2017 07/04/2017 06/17/2016  PHQ - 2 Score 0 0 1 0  PHQ- 9 Score - - 6 -    Cognitive Function Ad8 score reviewed for issues:  Issues making decisions:no  Less interest in hobbies / activities:no  Repeats questions, stories (family complaining):no  Trouble  using ordinary gadgets (microwave, computer, phone):no  Forgets the month or year: no  Mismanaging finances: no  Remembering appts:no  Daily problems with thinking and/or memory:no Ad8 score is=0         Immunization History  Administered Date(s) Administered  . Influenza Split 05/07/2012  . Influenza, High Dose Seasonal PF 06/03/2013  . Influenza,inj,Quad PF,6+ Mos 03/17/2014, 05/05/2015  . Influenza-Unspecified 05/20/2016, 04/17/2017  . PPD Test 11/25/2015  . Pneumococcal Conjugate-13 12/04/2013  . Pneumococcal Polysaccharide-23 09/20/2010  . Tdap 07/19/2011    Screening Tests Health Maintenance  Topic Date Due  . URINE MICROALBUMIN  12/16/2015  . OPHTHALMOLOGY EXAM  08/18/2018  . HEMOGLOBIN A1C  10/09/2018  . INFLUENZA VACCINE  02/16/2019  . FOOT EXAM  04/01/2019  . COLONOSCOPY  09/08/2019  . TETANUS/TDAP  07/18/2021  . PNA vac Low Risk Adult  Completed       Plan:   See you next year.  Continue to eat heart healthy diet (full of fruits, vegetables, whole grains, lean protein, water--limit salt, fat, and sugar intake) and increase physical activity as tolerated.  Continue doing brain stimulating activities (puzzles, reading, adult coloring books, staying active) to keep memory sharp.    I have personally reviewed and noted the following in the patient's chart:   . Medical and social history . Use of alcohol, tobacco or illicit drugs  . Current medications and supplements . Functional ability and status . Nutritional status . Physical activity . Advanced directives . List of other physicians . Hospitalizations, surgeries, and ER visits  in previous 12 months . Vitals . Screenings to include cognitive, depression, and falls . Referrals and appointments  In addition, I have reviewed and discussed with patient certain preventive protocols, quality metrics, and best practice recommendations. A written personalized care plan for preventive services as well  as general preventive health recommendations were provided to patient.     Shela Nevin, [place]  12/03/2018

## 2018-12-03 ENCOUNTER — Encounter: Payer: Self-pay | Admitting: *Deleted

## 2018-12-03 ENCOUNTER — Other Ambulatory Visit: Payer: Self-pay

## 2018-12-03 ENCOUNTER — Ambulatory Visit (INDEPENDENT_AMBULATORY_CARE_PROVIDER_SITE_OTHER): Payer: Medicare Other | Admitting: *Deleted

## 2018-12-03 DIAGNOSIS — Z0000 Encounter for general adult medical examination without abnormal findings: Secondary | ICD-10-CM | POA: Diagnosis not present

## 2018-12-03 DIAGNOSIS — Z992 Dependence on renal dialysis: Secondary | ICD-10-CM | POA: Diagnosis not present

## 2018-12-03 DIAGNOSIS — I5042 Chronic combined systolic (congestive) and diastolic (congestive) heart failure: Secondary | ICD-10-CM | POA: Diagnosis not present

## 2018-12-03 DIAGNOSIS — I4891 Unspecified atrial fibrillation: Secondary | ICD-10-CM | POA: Diagnosis not present

## 2018-12-03 DIAGNOSIS — N2581 Secondary hyperparathyroidism of renal origin: Secondary | ICD-10-CM | POA: Diagnosis not present

## 2018-12-03 DIAGNOSIS — N186 End stage renal disease: Secondary | ICD-10-CM | POA: Diagnosis not present

## 2018-12-03 DIAGNOSIS — D631 Anemia in chronic kidney disease: Secondary | ICD-10-CM | POA: Diagnosis not present

## 2018-12-03 NOTE — Telephone Encounter (Signed)
Routed to [facility] for advised

## 2018-12-03 NOTE — Progress Notes (Signed)
RN note reviewed and agree.  Edana Aguado S [place] NP 

## 2018-12-03 NOTE — Telephone Encounter (Signed)
zofran is not a regular medicine. Please advise Home health that this has been removed from his list.  OK if he declines HH aid- that is up to the patient.

## 2018-12-03 NOTE — Patient Instructions (Signed)
See you next year.  Continue to eat heart healthy diet (full of fruits, vegetables, whole grains, lean protein, water--limit salt, fat, and sugar intake) and increase physical activity as tolerated.  Continue doing brain stimulating activities (puzzles, reading, adult coloring books, staying active) to keep memory sharp.    [name] , Thank you for taking time to come for your Medicare Wellness Visit. I appreciate your ongoing commitment to your health goals. Please review the following plan we discussed and let me know if I can assist you in the future.   These are the goals we discussed: Goals    . eating well       This is a list of the screening recommended for you and due dates:  Health Maintenance  Topic Date Due  . Urine Protein Check  12/16/2015  . Eye exam for diabetics  08/18/2018  . Hemoglobin A1C  10/09/2018  . Flu Shot  02/16/2019  . Complete foot exam   04/01/2019  . Colon Cancer Screening  09/08/2019  . Tetanus Vaccine  07/18/2021  . Pneumonia vaccines  Completed    Health Maintenance After Age 38 After age 2, you are at a higher risk for certain long-term diseases and infections as well as injuries from falls. Falls are a major cause of broken bones and head injuries in people who are older than age 31. Getting regular preventive care can help to keep you healthy and well. Preventive care includes getting regular testing and making lifestyle changes as recommended by your health care provider. Talk with your health care provider about:  Which screenings and tests you should have. A screening is a test that checks for a disease when you have no symptoms.  A diet and exercise plan that is right for you. What should I know about screenings and tests to prevent falls? Screening and testing are the best ways to find a health problem early. Early diagnosis and treatment give you the best chance of managing medical conditions that are common after age 44. Certain  conditions and lifestyle choices may make you more likely to have a fall. Your health care provider may recommend:  Regular vision checks. Poor vision and conditions such as cataracts can make you more likely to have a fall. If you wear glasses, make sure to get your prescription updated if your vision changes.  Medicine review. Work with your health care provider to regularly review all of the medicines you are taking, including over-the-counter medicines. Ask your health care provider about any side effects that may make you more likely to have a fall. Tell your health care provider if any medicines that you take make you feel dizzy or sleepy.  Osteoporosis screening. Osteoporosis is a condition that causes the bones to get weaker. This can make the bones weak and cause them to break more easily.  Blood pressure screening. Blood pressure changes and medicines to control blood pressure can make you feel dizzy.  Strength and balance checks. Your health care provider may recommend certain tests to check your strength and balance while standing, walking, or changing positions.  Foot health exam. Foot pain and numbness, as well as not wearing proper footwear, can make you more likely to have a fall.  Depression screening. You may be more likely to have a fall if you have a fear of falling, feel emotionally low, or feel unable to do activities that you used to do.  Alcohol use screening. Using too much alcohol can  affect your balance and may make you more likely to have a fall. What actions can I take to lower my risk of falls? General instructions  Talk with your health care provider about your risks for falling. Tell your health care provider if: ? You fall. Be sure to tell your health care provider about all falls, even ones that seem minor. ? You feel dizzy, sleepy, or off-balance.  Take over-the-counter and prescription medicines only as told by your health care provider. These include any  supplements.  Eat a healthy diet and maintain a healthy weight. A healthy diet includes low-fat dairy products, low-fat (lean) meats, and fiber from whole grains, beans, and lots of fruits and vegetables. Home safety  Remove any tripping hazards, such as rugs, cords, and clutter.  Install safety equipment such as grab bars in bathrooms and safety rails on stairs.  Keep rooms and walkways well-lit. Activity   Follow a regular exercise program to stay fit. This will help you maintain your balance. Ask your health care provider what types of exercise are appropriate for you.  If you need a cane or walker, use it as recommended by your health care provider.  Wear supportive shoes that have nonskid soles. Lifestyle  Do not drink alcohol if your health care provider tells you not to drink.  If you drink alcohol, limit how much you have: ? 0-1 drink a day for women. ? 0-2 drinks a day for men.  Be aware of how much alcohol is in your drink. In the [place] one drink equals one typical bottle of beer (12 oz), one-half glass of wine (5 oz), or one shot of hard liquor (1 oz).  Do not use any products that contain nicotine or tobacco, such as cigarettes and e-cigarettes. If you need help quitting, ask your health care provider. Summary  Having a healthy lifestyle and getting preventive care can help to protect your health and wellness after age 74.  Screening and testing are the best way to find a health problem early and help you avoid having a fall. Early diagnosis and treatment give you the best chance for managing medical conditions that are more common for people who are older than age 41.  Falls are a major cause of broken bones and head injuries in people who are older than age 8. Take precautions to prevent a fall at home.  Work with your health care provider to learn what changes you can make to improve your health and wellness and to prevent falls. This information is not intended  to replace advice given to you by your health care provider. Make sure you discuss any questions you have with your health care provider. Document Released: 05/17/2017 Document Revised: 05/17/2017 Document Reviewed: 05/17/2017 Elsevier Interactive Patient Education  2019 [employer].

## 2018-12-04 ENCOUNTER — Encounter (HOSPITAL_BASED_OUTPATIENT_CLINIC_OR_DEPARTMENT_OTHER): Payer: Self-pay | Admitting: *Deleted

## 2018-12-04 ENCOUNTER — Emergency Department (HOSPITAL_BASED_OUTPATIENT_CLINIC_OR_DEPARTMENT_OTHER): Payer: Medicare Other

## 2018-12-04 ENCOUNTER — Ambulatory Visit (INDEPENDENT_AMBULATORY_CARE_PROVIDER_SITE_OTHER): Payer: Medicare Other | Admitting: Family

## 2018-12-04 ENCOUNTER — Telehealth: Payer: Self-pay | Admitting: Family

## 2018-12-04 ENCOUNTER — Other Ambulatory Visit: Payer: Self-pay

## 2018-12-04 ENCOUNTER — Emergency Department (HOSPITAL_BASED_OUTPATIENT_CLINIC_OR_DEPARTMENT_OTHER)
Admission: EM | Admit: 2018-12-04 | Discharge: 2018-12-04 | Disposition: A | Discharge status: Home / Self Care | Payer: Medicare Other | Attending: Emergency Medicine | Admitting: Emergency Medicine

## 2018-12-04 DIAGNOSIS — I5042 Chronic combined systolic (congestive) and diastolic (congestive) heart failure: Secondary | ICD-10-CM | POA: Diagnosis not present

## 2018-12-04 DIAGNOSIS — N186 End stage renal disease: Secondary | ICD-10-CM | POA: Insufficient documentation

## 2018-12-04 DIAGNOSIS — E1122 Type 2 diabetes mellitus with diabetic chronic kidney disease: Secondary | ICD-10-CM | POA: Diagnosis not present

## 2018-12-04 DIAGNOSIS — I120 Hypertensive chronic kidney disease with stage 5 chronic kidney disease or end stage renal disease: Secondary | ICD-10-CM | POA: Diagnosis not present

## 2018-12-04 DIAGNOSIS — Z96651 Presence of right artificial knee joint: Secondary | ICD-10-CM | POA: Diagnosis not present

## 2018-12-04 DIAGNOSIS — R0602 Shortness of breath: Secondary | ICD-10-CM | POA: Diagnosis not present

## 2018-12-04 DIAGNOSIS — R569 Unspecified convulsions: Secondary | ICD-10-CM | POA: Insufficient documentation

## 2018-12-04 DIAGNOSIS — I132 Hypertensive heart and chronic kidney disease with heart failure and with stage 5 chronic kidney disease, or end stage renal disease: Secondary | ICD-10-CM | POA: Diagnosis not present

## 2018-12-04 DIAGNOSIS — R079 Chest pain, unspecified: Secondary | ICD-10-CM | POA: Diagnosis not present

## 2018-12-04 DIAGNOSIS — M25552 Pain in left hip: Secondary | ICD-10-CM

## 2018-12-04 DIAGNOSIS — Z96642 Presence of left artificial hip joint: Secondary | ICD-10-CM | POA: Insufficient documentation

## 2018-12-04 DIAGNOSIS — R251 Tremor, unspecified: Secondary | ICD-10-CM

## 2018-12-04 DIAGNOSIS — R609 Edema, unspecified: Secondary | ICD-10-CM | POA: Diagnosis not present

## 2018-12-04 DIAGNOSIS — S3993XA Unspecified injury of pelvis, initial encounter: Secondary | ICD-10-CM | POA: Diagnosis not present

## 2018-12-04 DIAGNOSIS — Z96641 Presence of right artificial hip joint: Secondary | ICD-10-CM | POA: Insufficient documentation

## 2018-12-04 DIAGNOSIS — S299XXA Unspecified injury of thorax, initial encounter: Secondary | ICD-10-CM | POA: Diagnosis not present

## 2018-12-04 DIAGNOSIS — Z79899 Other long term (current) drug therapy: Secondary | ICD-10-CM | POA: Insufficient documentation

## 2018-12-04 DIAGNOSIS — R29818 Other symptoms and signs involving the nervous system: Secondary | ICD-10-CM

## 2018-12-04 DIAGNOSIS — Z992 Dependence on renal dialysis: Secondary | ICD-10-CM | POA: Insufficient documentation

## 2018-12-04 DIAGNOSIS — R110 Nausea: Secondary | ICD-10-CM | POA: Diagnosis not present

## 2018-12-04 DIAGNOSIS — R102 Pelvic and perineal pain: Secondary | ICD-10-CM | POA: Diagnosis not present

## 2018-12-04 DIAGNOSIS — R509 Fever, unspecified: Secondary | ICD-10-CM | POA: Diagnosis not present

## 2018-12-04 DIAGNOSIS — R1111 Vomiting without nausea: Secondary | ICD-10-CM | POA: Diagnosis not present

## 2018-12-04 DIAGNOSIS — Z7901 Long term (current) use of anticoagulants: Secondary | ICD-10-CM | POA: Insufficient documentation

## 2018-12-04 LAB — COMPREHENSIVE METABOLIC PANEL
ALT: 15 U/L (ref 0–44)
AST: 29 U/L (ref 15–41)
Albumin: 3.2 g/dL — ABNORMAL LOW (ref 3.5–5.0)
Alkaline Phosphatase: 306 U/L — ABNORMAL HIGH (ref 38–126)
Anion gap: 12 (ref 5–15)
BUN: 31 mg/dL — ABNORMAL HIGH (ref 8–23)
CO2: 27 mmol/L (ref 22–32)
Calcium: 10.2 mg/dL (ref 8.9–10.3)
Chloride: 96 mmol/L — ABNORMAL LOW (ref 98–111)
Creatinine, Ser: 7.33 mg/dL — ABNORMAL HIGH (ref 0.61–1.24)
GFR calc Af Amer: 8 mL/min — ABNORMAL LOW (ref >60)
GFR calc non Af Amer: 7 mL/min — ABNORMAL LOW (ref >60)
Glucose, Bld: 97 mg/dL (ref 70–99)
Potassium: 3.6 mmol/L (ref 3.5–5.1)
Sodium: 135 mmol/L (ref 135–145)
Total Bilirubin: 0.5 mg/dL (ref 0.3–1.2)
Total Protein: 8.2 g/dL — ABNORMAL HIGH (ref 6.5–8.1)

## 2018-12-04 LAB — CBC WITH DIFFERENTIAL/PLATELET
Abs Immature Granulocytes: 0.03 10*3/uL (ref 0.00–0.07)
Basophils Absolute: 0.1 10*3/uL (ref 0.0–0.1)
Basophils Relative: 1 %
Eosinophils Absolute: 0.2 10*3/uL (ref 0.0–0.5)
Eosinophils Relative: 2 %
HCT: 32.9 % — ABNORMAL LOW (ref 39.0–52.0)
Hemoglobin: 10.1 g/dL — ABNORMAL LOW (ref 13.0–17.0)
Immature Granulocytes: 0 %
Lymphocytes Relative: 11 %
Lymphs Abs: 1 10*3/uL (ref 0.7–4.0)
MCH: 30.3 pg (ref 26.0–34.0)
MCHC: 30.7 g/dL (ref 30.0–36.0)
MCV: 98.8 fL (ref 80.0–100.0)
Monocytes Absolute: 1.1 10*3/uL — ABNORMAL HIGH (ref 0.1–1.0)
Monocytes Relative: 12 %
Neutro Abs: 6.8 10*3/uL (ref 1.7–7.7)
Neutrophils Relative %: 74 %
Platelets: 293 10*3/uL (ref 150–400)
RBC: 3.33 MIL/uL — ABNORMAL LOW (ref 4.22–5.81)
RDW: 17.8 % — ABNORMAL HIGH (ref 11.5–15.5)
WBC: 9.1 10*3/uL (ref 4.0–10.5)
nRBC: 0 % (ref 0.0–0.2)

## 2018-12-04 LAB — TROPONIN I: Troponin I: 0.08 ng/mL (ref <0.03)

## 2018-12-04 LAB — CBG MONITORING, ED: Glucose-Capillary: 84 mg/dL (ref 70–99)

## 2018-12-04 LAB — PROTIME-INR
INR: 3.7 — ABNORMAL HIGH (ref 0.8–1.2)
Prothrombin Time: 36.1 seconds — ABNORMAL HIGH (ref 11.4–15.2)

## 2018-12-04 MED ORDER — LEVETIRACETAM IN NACL 1000 MG/100ML IV SOLN
1000.0000 mg | Freq: Once | INTRAVENOUS | Status: AC
  Administered 2018-12-04: 19:00:00 1000 mg via INTRAVENOUS
  Filled 2018-12-04: qty 100

## 2018-12-04 MED ORDER — LEVETIRACETAM 500 MG PO TABS: 500.0000 mg | ORAL_TABLET | Freq: Two times a day (BID) | ORAL | 0 refills | Status: DC

## 2018-12-04 NOTE — Telephone Encounter (Signed)
Copied from [place] [id number]. Topic: Quick Communication - Home Health Verbal Orders >> Dec 04, 2018  4:16 PM Nils Flack wrote: Caller/Agency: sean Shelly Bombard Number: [phone] Requesting OT/PT/Skilled Nursing/Social Work/Speech Therapy:  PT  Frequency: called to let us know pt fell and is on way to ED  Needs order to move this visit to end of plan of care

## 2018-12-04 NOTE — ED Notes (Signed)
Pt. Reports to RN he fell on the floor this morning when he woke to get out of bed.  Pt. Is alert and oriented.  Pt. Has no distress noted.  Pt. Reports he felt weak when he woke up.   He went to dialysis yesterday.

## 2018-12-04 NOTE — Telephone Encounter (Signed)
Left detailed message with this information.

## 2018-12-04 NOTE — ED Triage Notes (Signed)
Per EMS:  Pt fell at home, no loc.  Pt had another fall three weeks ago.  Had E visit with Earlie Counts today.    Assisted pt from stretcher to chair with maximum assist from 2 persons.  Pt had notable generalized weakness.

## 2018-12-04 NOTE — Progress Notes (Signed)
Virtual Visit via Telephone Note  I connected with Bruce Cole on 12/04/18 at  1:20 PM EDT by telephone and verified that I am speaking with the correct person using two identifiers.  Location: Patient: home Provider: office   I discussed the limitations, risks, security and privacy concerns of performing an evaluation and management service by telephone and the availability of in person appointments. I also discussed with the patient that there may be a patient responsible charge related to this service. The patient expressed understanding and agreed to proceed.   History of Present Illness:  Patient is a 78 yr old male who presents today with chief complaint of tremors.He reports that tremors began about 1 month ago following a fall on 11/13/18.  Reports equal bilateral UE. No lower extremity weakness.  He denies neck pain. Thinks he hit his head when he fell.  Reviewed the results from his ED visit. This included an X-ray of the left hip. X-ray was negative for fracture and hardware appeared intact.  He reports that his left hip pain does not appear to be getting better.  Reports that "it is filling with fluid."  Observations/Objective:  Gen: Awake, alert, no obvious distress Resp: Breathing is even and non-labored Psych: calm/pleasant demeanor Neuro: Alert and Oriented x 3,  speech is clear.   Assessment and Plan:  Tremor- new.  Will refer to neurology for further evaluation.  Head trauma- will obtain CT head to rule out SDH given recent head trauma and new onset tremors.   Left hip pain- unchanged since his fall. Will arrange consult with orthopedics for further evaluation.   Follow Up Instructions:    I discussed the assessment and treatment plan with the patient. The patient was provided an opportunity to ask questions and all were answered. The patient agreed with the plan and demonstrated an understanding of the instructions.   The patient was advised to call back or  seek an in-person evaluation if the symptoms worsen or if the condition fails to improve as anticipated.  I provided 15 minutes of non-face-to-face time during this encounter.   Nance Pear, NP

## 2018-12-04 NOTE — ED Provider Notes (Signed)
[facility] EMERGENCY DEPARTMENT Provider Note   CSN: [id number] Arrival date & time: 12/04/18  1532    History   Chief Complaint Chief Complaint  Patient presents with   Leg Pain    HPI ZAQUAN DUFFNER is a 78 y.o. male.     Pt presents to the ED today because he fell off his bed after developing tremors.  Pt said he was lying in bed when the tremors started out of the blue.  The next thing he knew, he was on the ground.  He does not think he hit his bed, but is not sure.  He was here on 4/28 for the same.  He is on coumadin for afib.  Pt did see his pcp today for this problem.  PCP plans to refer him to neurology for the tremors.  Pt is a dialysis pt who has been compliant with his Tu/Th/Sat dialysis.  Pt denies sob or cough.  No fever.  He no longer urinates.  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  CHA2DS2/VAS Stroke Risk Points  Current as of 2 minutes ago     5 >= 2 Points: High Risk  1 - 1.99 Points: Medium Risk  0 Points: Low Risk    The patient's score has not changed in the past year.:  No Change     Details    This score determines the patient's risk of having a stroke if the  patient has atrial fibrillation.       Points Metrics  1 Has Congestive Heart Failure:  Yes    Current as of 2 minutes ago  0 Has Vascular Disease:  No    Current as of 2 minutes ago  1 Has Hypertension:  Yes    Current as of 2 minutes ago  2 Age:  26    Current as of 2 minutes ago  1 Has Diabetes:  Yes    Current as of 2 minutes ago  0 Had Stroke:  No  Had TIA:  No  Had thromboembolism:  No    Current as of 2 minutes ago  0 Male:  No    Current as of 2 minutes ago              Past Medical History:  Diagnosis Date   Acute blood loss anemia    Atrial fibrillation (HCC)    Chronic   Chronic combined systolic and diastolic heart failure (HCC)    Colon polyp 2000   Dysrhythmia    hx   ESRD on hemodialysis ([place])    Hemo TTHS- [place]    Essential hypertension    Gastritis and gastroduodenitis    GI bleed    Gout    Heart murmur    History of blood transfusion    History of kidney stones    Nephrolithiasis    OSA (obstructive sleep apnea) 09/02/2013    IMPRESSION :  1. Mild obstructive sleep apnea with hypopneas causing sleep fragmentation and moderate oxygen desaturation.  2. Short runs of nonsustained VT were noted. His beta blocker may need to be titrated 3. Significant PLM's were noted, the PLM arousal index was low. Please correlate with clinical history of restless leg syndrome.  4. Sleep efficiency was poor.  RECOMMENDATION:  1. Treatment options for this degree of sleep disordered breathing include weight loss and positional therapy to avoid supine sleep 2. Consider titrating beta blocker further, defer to cardiologist 3. Patient should be  cautioned against driving when sleepy.They should be asked to avoid medications with sedative side effects      Osteoarthritis of right hip    Pneumonia    hx 30 yrs ago   Primary osteoarthritis of right hip    Rheumatoid arthritis ([place])    Thrombocytopenia ([place])     Patient Active Problem List   Diagnosis Date Noted   High risk medication use 03/17/2017   Hyperkalemia 08/01/2016   Aftercare following surgery of the circulatory system 06/17/2016   Status post total replacement of right hip 03/22/2016   ESRD on dialysis ([place]) 11/27/2015   OSA (obstructive sleep apnea) 09/02/2013   Chronic combined systolic and diastolic heart failure ([place]) 03/16/2013   Allergic rhinitis 12/18/2012   Long term current use of anticoagulant therapy 11/25/2010   Genital herpes 05/31/2010   DM (diabetes mellitus), type 2 with renal complications ([place]) 15/17/6160   ERECTILE DYSFUNCTION, ORGANIC 09/21/2009   Osteoarthritis 09/21/2009   PERSONAL HX COLONIC POLYPS 08/25/2009   Gout 07/22/2009   Essential hypertension 07/22/2009   ATRIAL FIBRILLATION 07/22/2009    Rheumatoid arthritis ([place]) 07/22/2009   NEPHROLITHIASIS, HX OF 07/22/2009    Past Surgical History:  Procedure Laterality Date   AV FISTULA PLACEMENT Left 08/26/2015   Procedure: LEFT RADIOCEPHALIC FISTULA CREATION;  Surgeon: Rosetta Posner, MD;  Location: [facility];  Service: Vascular;  Laterality: Left;   AV FISTULA PLACEMENT Left 11/23/2015   Procedure: ARTERIOVENOUS (AV) FISTULA CREATION;  Surgeon: Rosetta Posner, MD;  Location: [facility];  Service: Vascular;  Laterality: Left;   BACK SURGERY     x2- discectomy   CHOLECYSTECTOMY  1994   CYSTOSCOPY/RETROGRADE/URETEROSCOPY/STONE EXTRACTION WITH BASKET     ESOPHAGOGASTRODUODENOSCOPY N/A 11/13/2015   Procedure: ESOPHAGOGASTRODUODENOSCOPY (EGD);  Surgeon: Irene Shipper, MD;  Location: [facility] ENDOSCOPY;  Service: Endoscopy;  Laterality: N/A;   INSERTION OF DIALYSIS CATHETER Left 11/23/2015   Procedure: INSERTION OF DIALYSIS CATHETER;  Surgeon: Rosetta Posner, MD;  Location: [facility];  Service: Vascular;  Laterality: Left;   IR GENERIC HISTORICAL Left 08/04/2016   IR DIALY SHUNT INTRO NEEDLE/INTRACATH INITIAL W/IMG LEFT 08/04/2016 Markus Daft, MD [facility]   IR GENERIC HISTORICAL  08/04/2016   IR US GUIDE VASC ACCESS LEFT 08/04/2016 Markus Daft, MD [facility]   JOINT REPLACEMENT     Total L-Hip replacement, Right Knee 10/20/09   LEFT AND RIGHT HEART CATHETERIZATION WITH CORONARY ANGIOGRAM N/A 02/22/2013   Procedure: LEFT AND RIGHT HEART CATHETERIZATION WITH CORONARY ANGIOGRAM;  Surgeon: Jolaine Artist, MD;  Location: [facility] CATH LAB;  Service: Cardiovascular;  Laterality: N/A;   LITHOTRIPSY  90's   PERIPHERAL VASCULAR CATHETERIZATION Left 11/19/2015   Procedure: A/V/Fistulagram;  Surgeon: Conrad [place], MD;  Location: [facility] CV LAB;  Service: Cardiovascular;  Laterality: Left;   PERIPHERAL VASCULAR CATHETERIZATION Left 07/21/2016   Procedure: A/V Fistulagram;  Surgeon: Conrad Prescott, MD;  Location: [facility] CV LAB;  Service: Cardiovascular;  Laterality:  Left;  arm   REVISON OF ARTERIOVENOUS FISTULA Left 05/30/2016   Procedure: REVISION LEFT UPPER ARM FISTULA;  Surgeon: Conrad Murdock, MD;  Location: [facility];  Service: Vascular;  Laterality: Left;   REVISON OF ARTERIOVENOUS FISTULA Left 07/22/2016   Procedure: REVISON OF BASILIC VEIN TRANSPOSITION ANASTOMOSIS;  Surgeon: Rosetta Posner, MD;  Location: [facility];  Service: Vascular;  Laterality: Left;   SPINE SURGERY     x 2   TOTAL HIP ARTHROPLASTY Right 03/22/2016   Procedure: RIGHT TOTAL  HIP ARTHROPLASTY ANTERIOR APPROACH;  Surgeon: Mcarthur Rossetti, MD;  Location: [facility];  Service: Orthopedics;  Laterality: Right;        Home Medications    Prior to Admission medications   Medication Sig Start Date End Date Taking? Authorizing Provider  acetaminophen (TYLENOL) 500 MG tablet Take 500 mg by mouth every 6 (six) hours as needed for mild pain.    [provider]  albuterol (PROVENTIL) (2.5 MG/3ML) 0.083% nebulizer solution Take 2.5 mg by nebulization every 4 (four) hours as needed for shortness of breath. Reported on 09/25/2015    [provider]  atorvastatin (LIPITOR) 10 MG tablet TAKE 1 TABLET(10 MG) BY MOUTH DAILY 01/02/18   Debbrah Alar, NP  B Complex-C-Folic Acid (RENA-VITE RX) 1 MG TABS Take 1 tablet by mouth at bedtime.  02/18/16   [provider]  colchicine 0.6 MG tablet Take 1/2 tablet by mouth once as needed for gout flare. Call if no improvement 01/02/18   Debbrah Alar, NP  fluticasone (FLONASE) 50 MCG/ACT nasal spray INSTILL 2 SPRAYS INTO BOTH NOSTRILS EVERY DAY Patient not taking: Reported on 12/03/2018 01/02/18   Debbrah Alar, NP  furosemide (LASIX) 40 MG tablet TAKE 1 TABLET(40 MG) BY MOUTH TWICE DAILY Patient not taking: Reported on 12/03/2018 10/03/17   Debbrah Alar, NP  hydroxychloroquine (PLAQUENIL) 200 MG tablet TAKE 1 TABLET(200 MG) BY MOUTH DAILY Patient not taking: Reported on 12/03/2018 10/09/18   Debbrah Alar, NP    levETIRAcetam (KEPPRA) 500 MG tablet Take 1 tablet (500 mg total) by mouth 2 (two) times daily. 12/04/18   Isla Pence, MD  lidocaine (LIDODERM) 5 % Place 1 patch onto the skin daily. Remove & Discard patch within 12 hours or as directed by MD Patient not taking: Reported on 12/03/2018 07/03/18   Debbrah Alar, NP  pantoprazole (PROTONIX) 40 MG tablet Take 1 tablet (40 mg total) by mouth daily. Patient not taking: Reported on 12/03/2018 10/09/18   Debbrah Alar, NP  pregabalin (LYRICA) 50 MG capsule Take 1 tab by mouth once daily after hemodialysis 10/09/18   Debbrah Alar, NP  RENVELA 800 MG tablet Take 3 tablets (2,400 mg total) by mouth 3 (three) times daily with meals. 07/23/16   Lavina Hamman, MD  valACYclovir (VALTREX) 500 MG tablet Take 1 tablet by mouth every other day 11/27/18   Debbrah Alar, NP  warfarin (COUMADIN) 7.5 MG tablet TAKE 1 TABLET BY MOUTH DAILY AS DIRECTED BY COUMADIN CLINIC 09/11/18   Debbrah Alar, NP    Family History Family History  Problem Relation Age of Onset   Hypertension Mother    Arthritis Mother        ?RA   Hypertension Father     Social History Social History   Tobacco Use   Smoking status: Never Smoker   Smokeless tobacco: Never Used  Substance Use Topics   Alcohol use: No    Comment: occasional   Drug use: No     Allergies   Ace inhibitors   Review of Systems Review of Systems  Neurological: Positive for tremors.  All other systems reviewed and are negative.    Physical Exam Updated Vital Signs BP 118/64    Pulse (!) 49    Temp 98.2 F (36.8 C) (Oral)    Resp (!) 21    Ht 6\' 3"  (1.905 m)    Wt 113.4 kg    SpO2 100%    BMI 31.25 kg/m   Physical Exam Vitals signs and  nursing note reviewed.  Constitutional:      Appearance: Normal appearance.  HENT:     Head: Normocephalic and atraumatic.     Right Ear: External ear normal.     Left Ear: External ear normal.     Nose: Nose normal.      Mouth/Throat:     Mouth: Mucous membranes are moist.     Pharynx: Oropharynx is clear.  Eyes:     Extraocular Movements: Extraocular movements intact.     Conjunctiva/sclera: Conjunctivae normal.     Pupils: Pupils are equal, round, and reactive to light.  Neck:     Musculoskeletal: Normal range of motion and neck supple.  Cardiovascular:     Rate and Rhythm: Bradycardia present. Rhythm irregular.     Pulses: Normal pulses.     Heart sounds: Normal heart sounds.  Pulmonary:     Effort: Pulmonary effort is normal.     Breath sounds: Normal breath sounds.  Abdominal:     General: Abdomen is flat. Bowel sounds are normal.     Palpations: Abdomen is soft.  Musculoskeletal:     Right lower leg: Edema present.     Left lower leg: Edema present.  Skin:    General: Skin is warm.     Capillary Refill: Capillary refill takes less than 2 seconds.  Neurological:     General: No focal deficit present.     Mental Status: He is alert and oriented to person, place, and time.  Psychiatric:        Mood and Affect: Mood normal.        Behavior: Behavior normal.      ED Treatments / Results  Labs (all labs ordered are listed, but only abnormal results are displayed) Labs Reviewed  CBC WITH DIFFERENTIAL/PLATELET - Abnormal; Notable for the following components:      Result Value   RBC 3.33 (*)    Hemoglobin 10.1 (*)    HCT 32.9 (*)    RDW 17.8 (*)    Monocytes Absolute 1.1 (*)    All other components within normal limits  COMPREHENSIVE METABOLIC PANEL - Abnormal; Notable for the following components:   Chloride 96 (*)    BUN 31 (*)    Creatinine, Ser 7.33 (*)    Total Protein 8.2 (*)    Albumin 3.2 (*)    Alkaline Phosphatase 306 (*)    GFR calc non Af Amer 7 (*)    GFR calc Af Amer 8 (*)    All other components within normal limits  PROTIME-INR - Abnormal; Notable for the following components:   Prothrombin Time 36.1 (*)    INR 3.7 (*)    All other components within normal  limits  TROPONIN I - Abnormal; Notable for the following components:   Troponin I 0.08 (*)    All other components within normal limits  PROTIME-INR  CBG MONITORING, ED    EKG EKG Interpretation  Date/Time:  Tuesday Dec 04 2018 16:38:03 EDT Ventricular Rate:  57 PR Interval:    QRS Duration: 121 QT Interval:  453 QTC Calculation: 442 R Axis:   -75 Text Interpretation:  Atrial fibrillation Ventricular premature complex Nonspecific IVCD with LAD Inferior infarct, old Abnormal lateral Q waves Anterior infarct, old Baseline wander in lead(s) I No significant change since last tracing Confirmed by Isla Pence [phone]) on 12/04/2018 5:48:51 PM   Radiology Dg Chest 2 View  Result Date: 12/04/2018 CLINICAL DATA:  Pain status post fall EXAM:  CHEST - 2 VIEW COMPARISON:  07/16/2016 FINDINGS: The cardiac silhouette is enlarged. There is mild vascular congestion. The left lung base is poorly evaluated. There are degenerative changes of both glenohumeral joints. Vascular calcifications are noted. The lung volumes are low. IMPRESSION: Cardiomegaly with mild vascular congestion. Electronically Signed   By: Constance Holster M.D.   On: 12/04/2018 18:08   Dg Pelvis 1-2 Views  Result Date: 12/04/2018 CLINICAL DATA:  Pain status post fall EXAM: PELVIS - 1-2 VIEW COMPARISON:  CT dated 07/05/2018 FINDINGS: The patient is status post bilateral total hip arthroplasty. The alignment appears near anatomic. There is no displaced fracture. There is no dislocation. The osseous mineralization is decreased. Vascular calcifications are noted. IMPRESSION: No acute displaced fracture or dislocation. Electronically Signed   By: Constance Holster M.D.   On: 12/04/2018 18:09   Ct Head Wo Contrast  Result Date: 12/04/2018 CLINICAL DATA:  Tremors EXAM: CT HEAD WITHOUT CONTRAST TECHNIQUE: Contiguous axial images were obtained from the base of the skull through the vertex without intravenous contrast. COMPARISON:   12/05/2012 FINDINGS: Brain: No evidence of acute infarction, hemorrhage, hydrocephalus, extra-axial collection or mass lesion/mass effect. There is mild age related volume loss. 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 Vascular: No hyperdense vessel or unexpected calcification. Skull: Normal. Negative for fracture or focal lesion. Sinuses/Orbits: There is a small amount of fluid in the mastoid air cells. Other: None. IMPRESSION: No acute intracranial abnormality detected. Electronically Signed   By: Constance Holster M.D.   On: 12/04/2018 18:06    Procedures Procedures (including critical care time)  Medications Ordered in ED Medications  levETIRAcetam (KEPPRA) IVPB 1000 mg/100 mL premix ( Intravenous Stopped 12/04/18 1913)     Initial Impression / Assessment and Plan / ED Course  I have reviewed the triage vital signs and the nursing notes.  Pertinent labs & imaging results that were available during my care of the patient were reviewed by me and considered in my medical decision making (see chart for details).    Pt had an episode while here that was witnessed by 2 nurses.  He started having some twitching of his right arm and then he started acting confused.  The confusion lasted a few minutes, then he came back to his normal.  I suspect pt may be having seizures.  He was d/w Dr. Rory Percy (neurology) who recommended a 1g IV bolus of keppra and 500 mg bid as well as outpatient f/u with neurology.  Pt is at his normal mental status now.  He is instructed to return if worse.  Final Clinical Impressions(s) / ED Diagnoses   Final diagnoses:  ESRD on hemodialysis [facility])  Seizure [facility])    ED Discharge Orders         Ordered    levETIRAcetam (KEPPRA) 500 MG tablet  2 times daily     12/04/18 Oris Drone, MD 12/04/18 1928

## 2018-12-04 NOTE — ED Notes (Signed)
Pt. Came back from radiology with confusion for approx. 5 mins or less.  Pt. Was asking where he was with a blank look .  Pt. Had a shake in the R upper limb and then reoriented to himself and place and time.  Pt. Was not combative.

## 2018-12-04 NOTE — Telephone Encounter (Signed)
Information given to Barb Merino with the home health agency.

## 2018-12-04 NOTE — ED Triage Notes (Signed)
To ED via EMS with c/o pain and swelling in both of his legs. To triage via w/c.

## 2018-12-05 ENCOUNTER — Other Ambulatory Visit: Payer: Self-pay | Admitting: Family

## 2018-12-05 ENCOUNTER — Other Ambulatory Visit: Payer: Self-pay

## 2018-12-05 ENCOUNTER — Telehealth: Payer: Self-pay | Admitting: Family

## 2018-12-05 ENCOUNTER — Emergency Department (HOSPITAL_COMMUNITY)
Admission: EM | Admit: 2018-12-05 | Discharge: 2018-12-05 | Disposition: A | Discharge status: Home / Self Care | Payer: Medicare Other | Attending: Emergency Medicine | Admitting: Emergency Medicine

## 2018-12-05 ENCOUNTER — Encounter (HOSPITAL_COMMUNITY): Payer: Self-pay | Admitting: Emergency Medicine

## 2018-12-05 DIAGNOSIS — I132 Hypertensive heart and chronic kidney disease with heart failure and with stage 5 chronic kidney disease, or end stage renal disease: Secondary | ICD-10-CM | POA: Insufficient documentation

## 2018-12-05 DIAGNOSIS — R400 Somnolence: Secondary | ICD-10-CM | POA: Diagnosis not present

## 2018-12-05 DIAGNOSIS — D631 Anemia in chronic kidney disease: Secondary | ICD-10-CM | POA: Diagnosis not present

## 2018-12-05 DIAGNOSIS — I5042 Chronic combined systolic (congestive) and diastolic (congestive) heart failure: Secondary | ICD-10-CM | POA: Insufficient documentation

## 2018-12-05 DIAGNOSIS — Z992 Dependence on renal dialysis: Secondary | ICD-10-CM | POA: Diagnosis not present

## 2018-12-05 DIAGNOSIS — E1122 Type 2 diabetes mellitus with diabetic chronic kidney disease: Secondary | ICD-10-CM | POA: Insufficient documentation

## 2018-12-05 DIAGNOSIS — R4182 Altered mental status, unspecified: Secondary | ICD-10-CM | POA: Diagnosis not present

## 2018-12-05 DIAGNOSIS — N186 End stage renal disease: Secondary | ICD-10-CM | POA: Insufficient documentation

## 2018-12-05 DIAGNOSIS — R001 Bradycardia, unspecified: Secondary | ICD-10-CM

## 2018-12-05 DIAGNOSIS — R404 Transient alteration of awareness: Secondary | ICD-10-CM | POA: Diagnosis not present

## 2018-12-05 DIAGNOSIS — I4891 Unspecified atrial fibrillation: Secondary | ICD-10-CM | POA: Diagnosis not present

## 2018-12-05 DIAGNOSIS — R0902 Hypoxemia: Secondary | ICD-10-CM | POA: Diagnosis not present

## 2018-12-05 DIAGNOSIS — N2581 Secondary hyperparathyroidism of renal origin: Secondary | ICD-10-CM | POA: Diagnosis not present

## 2018-12-05 DIAGNOSIS — R4781 Slurred speech: Secondary | ICD-10-CM | POA: Diagnosis not present

## 2018-12-05 DIAGNOSIS — Z7901 Long term (current) use of anticoagulants: Secondary | ICD-10-CM | POA: Insufficient documentation

## 2018-12-05 DIAGNOSIS — R000 Tachycardia, unspecified: Secondary | ICD-10-CM | POA: Diagnosis not present

## 2018-12-05 LAB — CBC WITH DIFFERENTIAL/PLATELET
Abs Immature Granulocytes: 0.04 10*3/uL (ref 0.00–0.07)
Basophils Absolute: 0 10*3/uL (ref 0.0–0.1)
Basophils Relative: 0 %
Eosinophils Absolute: 0.2 10*3/uL (ref 0.0–0.5)
Eosinophils Relative: 2 %
HCT: 32.3 % — ABNORMAL LOW (ref 39.0–52.0)
Hemoglobin: 10.4 g/dL — ABNORMAL LOW (ref 13.0–17.0)
Immature Granulocytes: 1 %
Lymphocytes Relative: 10 %
Lymphs Abs: 0.9 10*3/uL (ref 0.7–4.0)
MCH: 31.1 pg (ref 26.0–34.0)
MCHC: 32.2 g/dL (ref 30.0–36.0)
MCV: 96.7 fL (ref 80.0–100.0)
Monocytes Absolute: 0.9 10*3/uL (ref 0.1–1.0)
Monocytes Relative: 10 %
Neutro Abs: 6.5 10*3/uL (ref 1.7–7.7)
Neutrophils Relative %: 77 %
Platelets: 231 10*3/uL (ref 150–400)
RBC: 3.34 MIL/uL — ABNORMAL LOW (ref 4.22–5.81)
RDW: 17.7 % — ABNORMAL HIGH (ref 11.5–15.5)
WBC: 8.6 10*3/uL (ref 4.0–10.5)
nRBC: 0 % (ref 0.0–0.2)

## 2018-12-05 LAB — BASIC METABOLIC PANEL
Anion gap: 13 (ref 5–15)
BUN: 13 mg/dL (ref 8–23)
CO2: 28 mmol/L (ref 22–32)
Calcium: 9.4 mg/dL (ref 8.9–10.3)
Chloride: 97 mmol/L — ABNORMAL LOW (ref 98–111)
Creatinine, Ser: 4.64 mg/dL — ABNORMAL HIGH (ref 0.61–1.24)
GFR calc Af Amer: 13 mL/min — ABNORMAL LOW (ref >60)
GFR calc non Af Amer: 11 mL/min — ABNORMAL LOW (ref >60)
Glucose, Bld: 125 mg/dL — ABNORMAL HIGH (ref 70–99)
Potassium: 3.3 mmol/L — ABNORMAL LOW (ref 3.5–5.1)
Sodium: 138 mmol/L (ref 135–145)

## 2018-12-05 LAB — TROPONIN I
Troponin I: 0.07 ng/mL (ref <0.03)
Troponin I: 0.07 ng/mL (ref <0.03)

## 2018-12-05 MED ORDER — SODIUM CHLORIDE 0.9 % IV BOLUS
500.0000 mL | Freq: Once | INTRAVENOUS | Status: AC
  Administered 2018-12-05: 13:00:00 500 mL via INTRAVENOUS

## 2018-12-05 MED ORDER — WARFARIN SODIUM 7.5 MG PO TABS: 7.5000 mg | ORAL_TABLET | Freq: Every day | ORAL | 0 refills | Status: DC

## 2018-12-05 NOTE — ED Notes (Signed)
Pt has become increasingly more agitated and uncooperative. Attempts to reassure pt mostly unsuccessful.

## 2018-12-05 NOTE — Telephone Encounter (Signed)
Called patient several times to both numbers and no answer

## 2018-12-05 NOTE — ED Notes (Signed)
Pt discharged. Discharged instructions reviewed with pt, pt verbalized understanding. Oppurtunity for questions provided. Pt discharged with all belongings. Pt in NAD at discharge. Friend to pick pt up.

## 2018-12-05 NOTE — ED Provider Notes (Signed)
[facility] EMERGENCY DEPARTMENT Provider Note   CSN: [id number] Arrival date & time: 12/05/18  1059    History   Chief Complaint Chief Complaint  Patient presents with  . Altered Mental Status  . Hypotension  . Aphasia    HPI Bruce Cole is a 78 y.o. male.  He is brought in by EMS from dialysis for evaluation of some slurred speech and altered mental status.  Patient himself denies complaints states he felt a little groggy after dialysis.  He said he felt well going to dialysis and was able to dress himself.  Per EMS report he was hypotensive during treatment and seemed slow to answer.  Patient himself denies any complaints now other than some chronic arthritis pains that he has in his hips and knees.  Patient was seen yesterday at [facility] for a recent fall.  It sounds like at that time while he was in the department he had an episode that might of been a seizure.  After discussion with neurology he was given Keppra with recommendations for an outpatient neuro follow-up.     The history is provided by the patient and the EMS personnel.  Altered Mental Status  Presenting symptoms: confusion   Severity:  Moderate Most recent episode:  Today Episode history:  Single Progression:  Resolved Chronicity:  New Context: head injury   Recent head injury:  Over 24 hours ago Associated symptoms: no abdominal pain, no difficulty breathing, no fever, no headaches, no light-headedness, no nausea, no rash, no visual change, no vomiting and no weakness     Past Medical History:  Diagnosis Date  . Acute blood loss anemia   . Atrial fibrillation (HCC)    Chronic  . Chronic combined systolic and diastolic heart failure ([place])   . Colon polyp 2000  . Dysrhythmia    hx  . ESRD on hemodialysis ([place])    [place]  . Essential hypertension   . Gastritis and gastroduodenitis   . GI bleed   . Gout   . Heart murmur   . History of blood  transfusion   . History of kidney stones   . Nephrolithiasis   . OSA (obstructive sleep apnea) 09/02/2013    IMPRESSION :  1. Mild obstructive sleep apnea with hypopneas causing sleep fragmentation and moderate oxygen desaturation.  2. Short runs of nonsustained VT were noted. His beta blocker may need to be titrated 3. Significant PLM's were noted, the PLM arousal index was low. Please correlate with clinical history of restless leg syndrome.  4. Sleep efficiency was poor.  RECOMMENDATION:  1. Treatment options for this degree of sleep disordered breathing include weight loss and positional therapy to avoid supine sleep 2. Consider titrating beta blocker further, defer to cardiologist 3. Patient should be cautioned against driving when sleepy.They should be asked to avoid medications with sedative side effects     . Osteoarthritis of right hip   . Pneumonia    hx 30 yrs ago  . Primary osteoarthritis of right hip   . Rheumatoid arthritis ([place])   . Thrombocytopenia [facility])     Patient Active Problem List   Diagnosis Date Noted  . High risk medication use 03/17/2017  . Hyperkalemia 08/01/2016  . Aftercare following surgery of the circulatory system 06/17/2016  . Status post total replacement of right hip 03/22/2016  . ESRD on dialysis ([place]) 11/27/2015  . OSA (obstructive sleep apnea) 09/02/2013  . Chronic combined  systolic and diastolic heart failure ([place]) 03/16/2013  . Allergic rhinitis 12/18/2012  . Long term current use of anticoagulant therapy 11/25/2010  . Genital herpes 05/31/2010  . DM (diabetes mellitus), type 2 with renal complications ([place]) 07/10/8249  . ERECTILE DYSFUNCTION, ORGANIC 09/21/2009  . Osteoarthritis 09/21/2009  . PERSONAL HX COLONIC POLYPS 08/25/2009  . Gout 07/22/2009  . Essential hypertension 07/22/2009  . ATRIAL FIBRILLATION 07/22/2009  . Rheumatoid arthritis ([place]) 07/22/2009  . NEPHROLITHIASIS, HX OF 07/22/2009    Past Surgical History:  Procedure  Laterality Date  . AV FISTULA PLACEMENT Left 08/26/2015   Procedure: LEFT RADIOCEPHALIC FISTULA CREATION;  Surgeon: Rosetta Posner, MD;  Location: [facility];  Service: Vascular;  Laterality: Left;  . AV FISTULA PLACEMENT Left 11/23/2015   Procedure: ARTERIOVENOUS (AV) FISTULA CREATION;  Surgeon: Rosetta Posner, MD;  Location: [facility];  Service: Vascular;  Laterality: Left;  . BACK SURGERY     x2- discectomy  . CHOLECYSTECTOMY  1994  . CYSTOSCOPY/RETROGRADE/URETEROSCOPY/STONE EXTRACTION WITH BASKET    . ESOPHAGOGASTRODUODENOSCOPY N/A 11/13/2015   Procedure: ESOPHAGOGASTRODUODENOSCOPY (EGD);  Surgeon: Irene Shipper, MD;  Location: [facility] ENDOSCOPY;  Service: Endoscopy;  Laterality: N/A;  . INSERTION OF DIALYSIS CATHETER Left 11/23/2015   Procedure: INSERTION OF DIALYSIS CATHETER;  Surgeon: Rosetta Posner, MD;  Location: [facility];  Service: Vascular;  Laterality: Left;  . IR GENERIC HISTORICAL Left 08/04/2016   IR DIALY SHUNT INTRO NEEDLE/INTRACATH INITIAL W/IMG LEFT 08/04/2016 Markus Daft, MD [facility]-INTERV RAD  . IR GENERIC HISTORICAL  08/04/2016   IR US GUIDE VASC ACCESS LEFT 08/04/2016 Markus Daft, MD [facility]INTER[facility]  . JOINT REPLACEMENT     Total L-Hip replacement, Right Knee 10/20/09  . LEFT AND RIGHT HEART CATHETERIZATION WITH CORONARY ANGIOGRAM N/A 02/22/2013   Procedure: LEFT AND RIGHT HEART CATHETERIZATION WITH CORONARY ANGIOGRAM;  Surgeon: Jolaine Artist, MD;  Location: [facility] CATH LAB;  Service: Cardiovascular;  Laterality: N/A;  . LITHOTRIPSY  90's  . PERIPHERAL VASCULAR CATHETERIZATION Left 11/19/2015   Procedure: A/V/Fistulagram;  Surgeon: Conrad Carlinville, MD;  Location: [facility] CV LAB;  Service: Cardiovascular;  Laterality: Left;  . PERIPHERAL VASCULAR CATHETERIZATION Left 07/21/2016   Procedure: A/V Fistulagram;  Surgeon: Conrad Nittany, MD;  Location: [facility] CV LAB;  Service: Cardiovascular;  Laterality: Left;  arm  . REVISON OF ARTERIOVENOUS FISTULA Left 05/30/2016   Procedure: REVISION LEFT UPPER ARM FISTULA;   Surgeon: Conrad Coolidge, MD;  Location: [facility];  Service: Vascular;  Laterality: Left;  . REVISON OF ARTERIOVENOUS FISTULA Left 07/22/2016   Procedure: REVISON OF BASILIC VEIN TRANSPOSITION ANASTOMOSIS;  Surgeon: Rosetta Posner, MD;  Location: [facility];  Service: Vascular;  Laterality: Left;  . SPINE SURGERY     x 2  . TOTAL HIP ARTHROPLASTY Right 03/22/2016   Procedure: RIGHT TOTAL HIP ARTHROPLASTY ANTERIOR APPROACH;  Surgeon: Mcarthur Rossetti, MD;  Location: [facility];  Service: Orthopedics;  Laterality: Right;        Home Medications    Prior to Admission medications   Medication Sig Start Date End Date Taking? Authorizing Provider  acetaminophen (TYLENOL) 500 MG tablet Take 500 mg by mouth every 6 (six) hours as needed for mild pain.    [provider]  albuterol (PROVENTIL) (2.5 MG/3ML) 0.083% nebulizer solution Take 2.5 mg by nebulization every 4 (four) hours as needed for shortness of breath. Reported on 09/25/2015    [provider]  atorvastatin (LIPITOR) 10 MG tablet TAKE 1 TABLET(10 MG)  BY MOUTH DAILY 01/02/18   Debbrah Alar, NP  B Complex-C-Folic Acid (RENA-VITE RX) 1 MG TABS Take 1 tablet by mouth at bedtime.  02/18/16   [provider]  colchicine 0.6 MG tablet Take 1/2 tablet by mouth once as needed for gout flare. Call if no improvement 01/02/18   Debbrah Alar, NP  fluticasone (FLONASE) 50 MCG/ACT nasal spray INSTILL 2 SPRAYS INTO BOTH NOSTRILS EVERY DAY Patient not taking: Reported on 12/03/2018 01/02/18   Debbrah Alar, NP  furosemide (LASIX) 40 MG tablet TAKE 1 TABLET(40 MG) BY MOUTH TWICE DAILY Patient not taking: Reported on 12/03/2018 10/03/17   Debbrah Alar, NP  hydroxychloroquine (PLAQUENIL) 200 MG tablet TAKE 1 TABLET(200 MG) BY MOUTH DAILY Patient not taking: Reported on 12/03/2018 10/09/18   Debbrah Alar, NP  levETIRAcetam (KEPPRA) 500 MG tablet Take 1 tablet (500 mg total) by mouth 2 (two) times daily. 12/04/18   Isla Pence, MD  lidocaine (LIDODERM) 5 % Place 1 patch onto the skin daily. Remove & Discard patch within 12 hours or as directed by MD Patient not taking: Reported on 12/03/2018 07/03/18   Debbrah Alar, NP  pantoprazole (PROTONIX) 40 MG tablet Take 1 tablet (40 mg total) by mouth daily. Patient not taking: Reported on 12/03/2018 10/09/18   Debbrah Alar, NP  pregabalin (LYRICA) 50 MG capsule Take 1 tab by mouth once daily after hemodialysis 10/09/18   Debbrah Alar, NP  RENVELA 800 MG tablet Take 3 tablets (2,400 mg total) by mouth 3 (three) times daily with meals. 07/23/16   Lavina Hamman, MD  valACYclovir (VALTREX) 500 MG tablet Take 1 tablet by mouth every other day 11/27/18   Debbrah Alar, NP  warfarin (COUMADIN) 7.5 MG tablet TAKE 1 TABLET BY MOUTH DAILY AS DIRECTED BY COUMADIN CLINIC Patient taking differently: 1/2 tablet on Wednesdays and Saturdays, full tablet all other days. 09/11/18   Debbrah Alar, NP    Family History Family History  Problem Relation Age of Onset  . Hypertension Mother   . Arthritis Mother        ?RA  . Hypertension Father     Social History Social History   Tobacco Use  . Smoking status: Never Smoker  . Smokeless tobacco: Never Used  Substance Use Topics  . Alcohol use: No    Comment: occasional  . Drug use: No     Allergies   Ace inhibitors   Review of Systems Review of Systems  Constitutional: Negative for fever.  HENT: Negative for sore throat.   Eyes: Negative for visual disturbance.  Respiratory: Negative for shortness of breath.   Cardiovascular: Negative for chest pain.  Gastrointestinal: Negative for abdominal pain, nausea and vomiting.  Genitourinary: Negative for dysuria.  Musculoskeletal: Negative for neck pain.  Skin: Negative for rash.  Neurological: Negative for weakness, light-headedness and headaches.  Psychiatric/Behavioral: Positive for confusion.     Physical Exam Updated Vital Signs BP  119/83   Pulse (!) 55   Temp 97.6 F (36.4 C) (Oral)   Resp (!) 23   SpO2 95%   Physical Exam Vitals signs and nursing note reviewed.  Constitutional:      Appearance: He is well-developed.  HENT:     Head: Normocephalic and atraumatic.  Eyes:     Conjunctiva/sclera: Conjunctivae normal.  Neck:     Musculoskeletal: Neck supple.  Cardiovascular:     Rate and Rhythm: Normal rate and regular rhythm.     Heart sounds: No murmur.  Pulmonary:  Effort: Pulmonary effort is normal. No respiratory distress.     Breath sounds: Normal breath sounds.  Abdominal:     Palpations: Abdomen is soft.     Tenderness: There is no abdominal tenderness.  Musculoskeletal:     Right lower leg: Edema present.     Left lower leg: Edema present.     Comments: Fistula left upper arm.  Skin:    General: Skin is warm and dry.  Neurological:     General: No focal deficit present.     Mental Status: He is alert and oriented to person, place, and time.      ED Treatments / Results  Labs (all labs ordered are listed, but only abnormal results are displayed) Labs Reviewed  BASIC METABOLIC PANEL - Abnormal; Notable for the following components:      Result Value   Potassium 3.3 (*)    Chloride 97 (*)    Glucose, Bld 125 (*)    Creatinine, Ser 4.64 (*)    GFR calc non Af Amer 11 (*)    GFR calc Af Amer 13 (*)    All other components within normal limits  CBC WITH DIFFERENTIAL/PLATELET - Abnormal; Notable for the following components:   RBC 3.34 (*)    Hemoglobin 10.4 (*)    HCT 32.3 (*)    RDW 17.7 (*)    All other components within normal limits  TROPONIN I - Abnormal; Notable for the following components:   Troponin I 0.07 (*)    All other components within normal limits  PROTIME-INR - Abnormal; Notable for the following components:   Prothrombin Time 41.1 (*)    INR 4.4 (*)    All other components within normal limits  TROPONIN I - Abnormal; Notable for the following components:    Troponin I 0.07 (*)    All other components within normal limits    EKG EKG Interpretation  Date/Time:  Wednesday Dec 05 2018 11:05:52 EDT Ventricular Rate:  60 PR Interval:    QRS Duration: 118 QT Interval:  477 QTC Calculation: 477 R Axis:   -78 Text Interpretation:  slow afib Ventricular premature complex Nonspecific IVCD with LAD Inferior infarct, old Anterolateral infarct, age indeterminate similar to prior 5/20 Confirmed by Aletta Edouard [phone]) on 12/05/2018 11:27:25 AM Also confirmed by Aletta Edouard [phone]), editor Philomena Doheny [phone])  on 12/05/2018 1:52:52 PM   Radiology Dg Chest 2 View  Result Date: 12/04/2018 CLINICAL DATA:  Pain status post fall EXAM: CHEST - 2 VIEW COMPARISON:  07/16/2016 FINDINGS: The cardiac silhouette is enlarged. There is mild vascular congestion. The left lung base is poorly evaluated. There are degenerative changes of both glenohumeral joints. Vascular calcifications are noted. The lung volumes are low. IMPRESSION: Cardiomegaly with mild vascular congestion. Electronically Signed   By: Constance Holster M.D.   On: 12/04/2018 18:08   Dg Pelvis 1-2 Views  Result Date: 12/04/2018 CLINICAL DATA:  Pain status post fall EXAM: PELVIS - 1-2 VIEW COMPARISON:  CT dated 07/05/2018 FINDINGS: The patient is status post bilateral total hip arthroplasty. The alignment appears near anatomic. There is no displaced fracture. There is no dislocation. The osseous mineralization is decreased. Vascular calcifications are noted. IMPRESSION: No acute displaced fracture or dislocation. Electronically Signed   By: Constance Holster M.D.   On: 12/04/2018 18:09   Ct Head Wo Contrast  Result Date: 12/04/2018 CLINICAL DATA:  Tremors EXAM: CT HEAD WITHOUT CONTRAST TECHNIQUE: Contiguous axial images were obtained from the base of  the skull through the vertex without intravenous contrast. COMPARISON:  12/05/2012 FINDINGS: Brain: No evidence of acute infarction, hemorrhage,  hydrocephalus, extra-axial collection or mass lesion/mass effect. There is mild age related volume loss. 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 Vascular: No hyperdense vessel or unexpected calcification. Skull: Normal. Negative for fracture or focal lesion. Sinuses/Orbits: There is a small amount of fluid in the mastoid air cells. Other: None. IMPRESSION: No acute intracranial abnormality detected. Electronically Signed   By: Constance Holster M.D.   On: 12/04/2018 18:06    Procedures Procedures (including critical care time)  Medications Ordered in ED Medications - No data to display   Initial Impression / Assessment and Plan / ED Course  I have reviewed the triage vital signs and the nursing notes.  Pertinent labs & imaging results that were available during my care of the patient were reviewed by me and considered in my medical decision making (see chart for details).  Clinical Course as of Dec 06 1623  Wed Dec 04, 6168  6237 78 year old male with end-stage renal disease here with an episode of altered mental status in the setting of hypotension at dialysis.  Patient feels back to baseline and has a benign exam.  Interestingly he had an episode yesterday of possible seizure activity while in the ED for evaluation for head injury.  Differential includes hypotension, presyncope, seizures, metabolic encephalopathy.   [MB]  3419 Patient states he feels back to baseline and asking for something to eat.   [MB]    Clinical Course User Index [MB] Hayden Rasmussen, MD      Bruce Cole was evaluated in Emergency Department on 12/06/2018 for the symptoms described in the history of present illness. He was evaluated in the context of the global COVID-19 pandemic, which necessitated consideration that the patient might be at risk for infection with the SARS-CoV-2 virus that causes COVID-19. Institutional protocols and algorithms that pertain to the evaluation of patients  at risk for COVID-19 are in a state of rapid change based on information released by regulatory bodies including the CDC and federal and state organizations. These policies and algorithms were followed during the patient's care in the ED.  Would like to admit the patient for further workup as had a possible seizure yesterday and then this altered period today. He is adamant that he is going home today and that he never should have been brought here. He feels he was just groggy after dialysis and that happens to him sometimes.   Final Clinical Impressions(s) / ED Diagnoses   Final diagnoses:  Somnolence    ED Discharge Orders    None       Hayden Rasmussen, MD 12/06/18 1627

## 2018-12-05 NOTE — ED Notes (Signed)
Pt contacts:   Amman Bartel (Son) [phone]  Neng Albee (daughter) [phone]  Farooq Petrovich (daughter) [phone]

## 2018-12-05 NOTE — Telephone Encounter (Signed)
I spoke to the pt's RN at HD "[facility]" and he told me that patient was brought to [facility] due to AMS and ? Placement.  They can adjust coumadin and obtain CT head.

## 2018-12-05 NOTE — Telephone Encounter (Signed)
Spoke to PT. He plans to see the patient tomorrow for ongoing PT.

## 2018-12-05 NOTE — ED Triage Notes (Signed)
Per EMS: pt from dialysis with c/o slurred speech and AMS. Pt completed treatment, 1.3L removed. Pt hypotensive during entire treatment. Pt seen at [facility] last evening for a fall on blood thinners. Per pt, scans were normal.  Unknown last seen normal.   EMS vitals:  BP 90/40 HR 60-80 Sp02 95% RA 98.1 Temp Tympanic

## 2018-12-05 NOTE — Telephone Encounter (Signed)
Just an FYI

## 2018-12-05 NOTE — ED Notes (Signed)
Spoke with pts son and provided update. Pt able to speak with son on phone as well.  Phone number: Waldo Damian [phone]

## 2018-12-05 NOTE — Telephone Encounter (Signed)
Lastly, please let him know that I am going to refer him to cardiology because his heart rate was low in the ED.

## 2018-12-05 NOTE — Telephone Encounter (Signed)
Reviewed ED records. His Coumadin level is high. Please advise him that I would like him to take 1/2 tab of coumadin tonight and then continue coumadin dosing as follows:  1/2 tablet of 7.5mg  coumadin on Wednesdays and Saturdays.  Full tab all other days. Will reach out to his HD team to arrange follow up INR.    Also, I would still like him to complete CT head. Can you please work with Hoyle Sauer downstairs to get this completed.  I don't think he has dialysis today so today would be a good day for him to complete it.

## 2018-12-05 NOTE — ED Notes (Signed)
Pt refusing to wear heart monitor, SPO2, and BP.

## 2018-12-05 NOTE — ED Notes (Signed)
Attempted to call all 3 patient contacts, no answer and unable to leave voicemail.

## 2018-12-05 NOTE — Discharge Instructions (Signed)
You are seen in the emergency department for some possible confusion during dialysis along with some low blood pressures.  Your symptoms seem to improve here.  Your lab work did not show any significant findings.  Yesterday you were evaluated in the emergency department for a possible seizure episode and will be important for you to follow-up with neurology as they recommended.  Please return if any worsening symptoms.

## 2018-12-05 NOTE — ED Notes (Signed)
This RN spoke with Neoma Laming, RN from pts dialysis center in regards to pts walker, which was left there by EMS. She said that she would be there waiting for him so that he could pick up his walker on his way home. Pts friend, arrived to EMS, and I spoke with him about the plan, which he agreed to. Pt was assisted into his friend's vehicle. Pt discharged.

## 2018-12-06 ENCOUNTER — Emergency Department (HOSPITAL_BASED_OUTPATIENT_CLINIC_OR_DEPARTMENT_OTHER)
Admission: EM | Admit: 2018-12-06 | Discharge: 2018-12-06 | Disposition: A | Discharge status: Home / Self Care | Payer: Medicare Other | Attending: Emergency Medicine | Admitting: Emergency Medicine

## 2018-12-06 ENCOUNTER — Encounter (HOSPITAL_BASED_OUTPATIENT_CLINIC_OR_DEPARTMENT_OTHER): Payer: Self-pay | Admitting: Emergency Medicine

## 2018-12-06 ENCOUNTER — Other Ambulatory Visit: Payer: Self-pay

## 2018-12-06 DIAGNOSIS — W19XXXA Unspecified fall, initial encounter: Secondary | ICD-10-CM | POA: Diagnosis not present

## 2018-12-06 DIAGNOSIS — I120 Hypertensive chronic kidney disease with stage 5 chronic kidney disease or end stage renal disease: Secondary | ICD-10-CM | POA: Insufficient documentation

## 2018-12-06 DIAGNOSIS — Z96652 Presence of left artificial knee joint: Secondary | ICD-10-CM | POA: Insufficient documentation

## 2018-12-06 DIAGNOSIS — I5042 Chronic combined systolic (congestive) and diastolic (congestive) heart failure: Secondary | ICD-10-CM | POA: Diagnosis not present

## 2018-12-06 DIAGNOSIS — Z992 Dependence on renal dialysis: Secondary | ICD-10-CM | POA: Diagnosis not present

## 2018-12-06 DIAGNOSIS — I10 Essential (primary) hypertension: Secondary | ICD-10-CM | POA: Diagnosis not present

## 2018-12-06 DIAGNOSIS — N186 End stage renal disease: Secondary | ICD-10-CM | POA: Insufficient documentation

## 2018-12-06 DIAGNOSIS — R040 Epistaxis: Secondary | ICD-10-CM

## 2018-12-06 DIAGNOSIS — Z96643 Presence of artificial hip joint, bilateral: Secondary | ICD-10-CM | POA: Diagnosis not present

## 2018-12-06 DIAGNOSIS — R457 State of emotional shock and stress, unspecified: Secondary | ICD-10-CM | POA: Diagnosis not present

## 2018-12-06 DIAGNOSIS — Z79899 Other long term (current) drug therapy: Secondary | ICD-10-CM | POA: Insufficient documentation

## 2018-12-06 DIAGNOSIS — Z7901 Long term (current) use of anticoagulants: Secondary | ICD-10-CM | POA: Insufficient documentation

## 2018-12-06 LAB — PROTIME-INR
INR: 4.4 (ref 0.8–1.2)
Prothrombin Time: 41.1 seconds — ABNORMAL HIGH (ref 11.4–15.2)

## 2018-12-06 LAB — CBC
HCT: 32 % — ABNORMAL LOW (ref 39.0–52.0)
Hemoglobin: 9.8 g/dL — ABNORMAL LOW (ref 13.0–17.0)
MCH: 30.6 pg (ref 26.0–34.0)
MCHC: 30.6 g/dL (ref 30.0–36.0)
MCV: 100 fL (ref 80.0–100.0)
Platelets: 234 10*3/uL (ref 150–400)
RBC: 3.2 MIL/uL — ABNORMAL LOW (ref 4.22–5.81)
RDW: 18 % — ABNORMAL HIGH (ref 11.5–15.5)
WBC: 8.1 10*3/uL (ref 4.0–10.5)
nRBC: 0 % (ref 0.0–0.2)

## 2018-12-06 LAB — BASIC METABOLIC PANEL
Anion gap: 11 (ref 5–15)
BUN: 30 mg/dL — ABNORMAL HIGH (ref 8–23)
CO2: 29 mmol/L (ref 22–32)
Calcium: 10.2 mg/dL (ref 8.9–10.3)
Chloride: 97 mmol/L — ABNORMAL LOW (ref 98–111)
Creatinine, Ser: 6.24 mg/dL — ABNORMAL HIGH (ref 0.61–1.24)
GFR calc Af Amer: 9 mL/min — ABNORMAL LOW (ref >60)
GFR calc non Af Amer: 8 mL/min — ABNORMAL LOW (ref >60)
Glucose, Bld: 144 mg/dL — ABNORMAL HIGH (ref 70–99)
Potassium: 3.5 mmol/L (ref 3.5–5.1)
Sodium: 137 mmol/L (ref 135–145)

## 2018-12-06 NOTE — ED Triage Notes (Signed)
Pt takes coumadin and reports 3 nose bleeds this morning. No bleeding at this time.

## 2018-12-06 NOTE — ED Provider Notes (Signed)
[facility] EMERGENCY DEPARTMENT Provider Note   CSN: [id number] Arrival date & time: 12/06/18  1124    History   Chief Complaint Chief Complaint  Patient presents with  . Epistaxis    HPI Bruce Cole is a 78 y.o. male.     HPI Patient presented to the emergency room for evaluation of nosebleeds.  Patient has been in the emergency room recently for falls.  He was seen in the ED on April 28 and May 19.  He had an episode that suggested possible seizure.  Patient had imaging tests and was discharged with outpatient neurology follow-up.  Patient came to the ED however for an unrelated issue, he had a nosebleed this morning.  Patient states the blood was coming out of both sides and also into his mouth.  He was able to apply pressure and the bleeding has had.  Patient called 911 to be evaluated.  Patient denies any trouble with any recent nasal trauma although he did fall as mentioned previously and had a head CT that was negative.  He denies any bruising or bleeding.  Patient last had dialysis yesterday Past Medical History:  Diagnosis Date  . Acute blood loss anemia   . Atrial fibrillation (HCC)    Chronic  . Chronic combined systolic and diastolic heart failure ([place])   . Colon polyp 2000  . Dysrhythmia    hx  . ESRD on hemodialysis ([place])    [place]  . Essential hypertension   . Gastritis and gastroduodenitis   . GI bleed   . Gout   . Heart murmur   . History of blood transfusion   . History of kidney stones   . Nephrolithiasis   . OSA (obstructive sleep apnea) 09/02/2013    IMPRESSION :  1. Mild obstructive sleep apnea with hypopneas causing sleep fragmentation and moderate oxygen desaturation.  2. Short runs of nonsustained VT were noted. His beta blocker may need to be titrated 3. Significant PLM's were noted, the PLM arousal index was low. Please correlate with clinical history of restless leg syndrome.  4. Sleep efficiency was poor.   RECOMMENDATION:  1. Treatment options for this degree of sleep disordered breathing include weight loss and positional therapy to avoid supine sleep 2. Consider titrating beta blocker further, defer to cardiologist 3. Patient should be cautioned against driving when sleepy.They should be asked to avoid medications with sedative side effects     . Osteoarthritis of right hip   . Pneumonia    hx 30 yrs ago  . Primary osteoarthritis of right hip   . Rheumatoid arthritis ([place])   . Thrombocytopenia [facility])     Patient Active Problem List   Diagnosis Date Noted  . High risk medication use 03/17/2017  . Hyperkalemia 08/01/2016  . Aftercare following surgery of the circulatory system 06/17/2016  . Status post total replacement of right hip 03/22/2016  . ESRD on dialysis ([place]) 11/27/2015  . OSA (obstructive sleep apnea) 09/02/2013  . Chronic combined systolic and diastolic heart failure ([place]) 03/16/2013  . Allergic rhinitis 12/18/2012  . Long term current use of anticoagulant therapy 11/25/2010  . Genital herpes 05/31/2010  . DM (diabetes mellitus), type 2 with renal complications ([place]) 00/86/7619  . ERECTILE DYSFUNCTION, ORGANIC 09/21/2009  . Osteoarthritis 09/21/2009  . PERSONAL HX COLONIC POLYPS 08/25/2009  . Gout 07/22/2009  . Essential hypertension 07/22/2009  . ATRIAL FIBRILLATION 07/22/2009  . Rheumatoid arthritis ([place]) 07/22/2009  . NEPHROLITHIASIS, HX  OF 07/22/2009    Past Surgical History:  Procedure Laterality Date  . AV FISTULA PLACEMENT Left 08/26/2015   Procedure: LEFT RADIOCEPHALIC FISTULA CREATION;  Surgeon: Rosetta Posner, MD;  Location: [facility];  Service: Vascular;  Laterality: Left;  . AV FISTULA PLACEMENT Left 11/23/2015   Procedure: ARTERIOVENOUS (AV) FISTULA CREATION;  Surgeon: Rosetta Posner, MD;  Location: [place];  Service: Vascular;  Laterality: Left;  . BACK SURGERY     x2- discectomy  . CHOLECYSTECTOMY  1994  . CYSTOSCOPY/RETROGRADE/URETEROSCOPY/STONE EXTRACTION WITH  BASKET    . ESOPHAGOGASTRODUODENOSCOPY N/A 11/13/2015   Procedure: ESOPHAGOGASTRODUODENOSCOPY (EGD);  Surgeon: Irene Shipper, MD;  Location: [facility] ENDOSCOPY;  Service: Endoscopy;  Laterality: N/A;  . INSERTION OF DIALYSIS CATHETER Left 11/23/2015   Procedure: INSERTION OF DIALYSIS CATHETER;  Surgeon: Rosetta Posner, MD;  Location: [place];  Service: Vascular;  Laterality: Left;  . IR GENERIC HISTORICAL Left 08/04/2016   IR DIALY SHUNT INTRO NEEDLE/INTRACATH INITIAL W/IMG LEFT 08/04/2016 Markus Daft, MD [facility]INTER[facility]  . IR GENERIC HISTORICAL  08/04/2016   IR US GUIDE VASC ACCESS LEFT 08/04/2016 Markus Daft, MD [facility]  . JOINT REPLACEMENT     Total L-Hip replacement, Right Knee 10/20/09  . LEFT AND RIGHT HEART CATHETERIZATION WITH CORONARY ANGIOGRAM N/A 02/22/2013   Procedure: LEFT AND RIGHT HEART CATHETERIZATION WITH CORONARY ANGIOGRAM;  Surgeon: Jolaine Artist, MD;  Location: [facility] CATH LAB;  Service: Cardiovascular;  Laterality: N/A;  . LITHOTRIPSY  90's  . PERIPHERAL VASCULAR CATHETERIZATION Left 11/19/2015   Procedure: A/V/Fistulagram;  Surgeon: Conrad [place], MD;  Location: [place] CV LAB;  Service: Cardiovascular;  Laterality: Left;  . PERIPHERAL VASCULAR CATHETERIZATION Left 07/21/2016   Procedure: A/V Fistulagram;  Surgeon: Conrad Timblin, MD;  Location: [place] CV LAB;  Service: Cardiovascular;  Laterality: Left;  arm  . REVISON OF ARTERIOVENOUS FISTULA Left 05/30/2016   Procedure: REVISION LEFT UPPER ARM FISTULA;  Surgeon: Conrad [place], MD;  Location: [place];  Service: Vascular;  Laterality: Left;  . REVISON OF ARTERIOVENOUS FISTULA Left 07/22/2016   Procedure: REVISON OF BASILIC VEIN TRANSPOSITION ANASTOMOSIS;  Surgeon: Rosetta Posner, MD;  Location: [place];  Service: Vascular;  Laterality: Left;  . SPINE SURGERY     x 2  . TOTAL HIP ARTHROPLASTY Right 03/22/2016   Procedure: RIGHT TOTAL HIP ARTHROPLASTY ANTERIOR APPROACH;  Surgeon: Mcarthur Rossetti, MD;  Location: [facility];  Service: Orthopedics;   Laterality: Right;        Home Medications    Prior to Admission medications   Medication Sig Start Date End Date Taking? Authorizing Provider  acetaminophen (TYLENOL) 500 MG tablet Take 500 mg by mouth every 6 (six) hours as needed for mild pain.    [provider]  albuterol (PROVENTIL) (2.5 MG/3ML) 0.083% nebulizer solution Take 2.5 mg by nebulization every 4 (four) hours as needed for shortness of breath. Reported on 09/25/2015    [provider]  atorvastatin (LIPITOR) 10 MG tablet TAKE 1 TABLET(10 MG) BY MOUTH DAILY 01/02/18   Debbrah Alar, NP  B Complex-C-Folic Acid (RENA-VITE RX) 1 MG TABS Take 1 tablet by mouth at bedtime.  02/18/16   [provider]  colchicine 0.6 MG tablet Take 1/2 tablet by mouth once as needed for gout flare. Call if no improvement 01/02/18   Debbrah Alar, NP  fluticasone (FLONASE) 50 MCG/ACT nasal spray INSTILL 2 SPRAYS INTO BOTH NOSTRILS EVERY DAY Patient not taking: Reported on 12/03/2018 01/02/18  Debbrah Alar, NP  furosemide (LASIX) 40 MG tablet TAKE 1 TABLET(40 MG) BY MOUTH TWICE DAILY Patient not taking: Reported on 12/03/2018 10/03/17   Debbrah Alar, NP  hydroxychloroquine (PLAQUENIL) 200 MG tablet TAKE 1 TABLET(200 MG) BY MOUTH DAILY Patient not taking: Reported on 12/03/2018 10/09/18   Debbrah Alar, NP  levETIRAcetam (KEPPRA) 500 MG tablet Take 1 tablet (500 mg total) by mouth 2 (two) times daily. 12/04/18   Isla Pence, MD  lidocaine (LIDODERM) 5 % Place 1 patch onto the skin daily. Remove & Discard patch within 12 hours or as directed by MD Patient not taking: Reported on 12/03/2018 07/03/18   Debbrah Alar, NP  pantoprazole (PROTONIX) 40 MG tablet Take 1 tablet (40 mg total) by mouth daily. Patient not taking: Reported on 12/03/2018 10/09/18   Debbrah Alar, NP  pregabalin (LYRICA) 50 MG capsule Take 1 tab by mouth once daily after hemodialysis 10/09/18   Debbrah Alar, NP   RENVELA 800 MG tablet Take 3 tablets (2,400 mg total) by mouth 3 (three) times daily with meals. 07/23/16   Lavina Hamman, MD  valACYclovir (VALTREX) 500 MG tablet Take 1 tablet by mouth every other day 11/27/18   Debbrah Alar, NP  warfarin (COUMADIN) 7.5 MG tablet Take 1 tablet (7.5 mg total) by mouth daily at 6 PM. 12/05/18   Debbrah Alar, NP    Family History Family History  Problem Relation Age of Onset  . Hypertension Mother   . Arthritis Mother        ?RA  . Hypertension Father     Social History Social History   Tobacco Use  . Smoking status: Never Smoker  . Smokeless tobacco: Never Used  Substance Use Topics  . Alcohol use: No    Comment: occasional  . Drug use: No     Allergies   Ace inhibitors   Review of Systems Review of Systems  All other systems reviewed and are negative.    Physical Exam Updated Vital Signs BP (!) 96/56 (BP Location: Right Arm)   Pulse 67   Temp 98.4 F (36.9 C)   Resp 20   Ht 1.905 m (6\' 3" )   Wt 113 kg   SpO2 100%   BMI 31.14 kg/m   Physical Exam Vitals signs and nursing note reviewed.  Constitutional:      General: He is not in acute distress.    Appearance: He is well-developed.  HENT:     Head: Normocephalic and atraumatic.     Comments: Mild area of tenderness slight abrasion right parietal region, appears old    Right Ear: External ear normal.     Left Ear: External ear normal.     Nose:     Comments: Some dried blood in the left nares, no blood in the oropharynx, no active bleeding Eyes:     General: No scleral icterus.       Right eye: No discharge.        Left eye: No discharge.     Conjunctiva/sclera: Conjunctivae normal.  Neck:     Musculoskeletal: Neck supple.     Trachea: No tracheal deviation.  Cardiovascular:     Rate and Rhythm: Normal rate and regular rhythm.  Pulmonary:     Effort: Pulmonary effort is normal. No respiratory distress.     Breath sounds: Normal breath sounds. No  stridor. No wheezing or rales.  Abdominal:     General: Bowel sounds are normal. There is no distension.  Palpations: Abdomen is soft.     Tenderness: There is no abdominal tenderness. There is no guarding or rebound.  Musculoskeletal:        General: No tenderness.  Skin:    General: Skin is warm and dry.     Findings: No rash.  Neurological:     Mental Status: He is alert.     Cranial Nerves: No cranial nerve deficit (no facial droop, extraocular movements intact, no slurred speech).     Sensory: No sensory deficit.     Motor: No abnormal muscle tone or seizure activity.     Coordination: Coordination normal.      ED Treatments / Results  Labs (all labs ordered are listed, but only abnormal results are displayed) Labs Reviewed  CBC - Abnormal; Notable for the following components:      Result Value   RBC 3.20 (*)    Hemoglobin 9.8 (*)    HCT 32.0 (*)    RDW 18.0 (*)    All other components within normal limits  BASIC METABOLIC PANEL - Abnormal; Notable for the following components:   Chloride 97 (*)    Glucose, Bld 144 (*)    BUN 30 (*)    Creatinine, Ser 6.24 (*)    GFR calc non Af Amer 8 (*)    GFR calc Af Amer 9 (*)    All other components within normal limits    EKG None  Radiology Dg Chest 2 View  Result Date: 12/04/2018 CLINICAL DATA:  Pain status post fall EXAM: CHEST - 2 VIEW COMPARISON:  07/16/2016 FINDINGS: The cardiac silhouette is enlarged. There is mild vascular congestion. The left lung base is poorly evaluated. There are degenerative changes of both glenohumeral joints. Vascular calcifications are noted. The lung volumes are low. IMPRESSION: Cardiomegaly with mild vascular congestion. Electronically Signed   By: Constance Holster M.D.   On: 12/04/2018 18:08   Dg Pelvis 1-2 Views  Result Date: 12/04/2018 CLINICAL DATA:  Pain status post fall EXAM: PELVIS - 1-2 VIEW COMPARISON:  CT dated 07/05/2018 FINDINGS: The patient is status post bilateral  total hip arthroplasty. The alignment appears near anatomic. There is no displaced fracture. There is no dislocation. The osseous mineralization is decreased. Vascular calcifications are noted. IMPRESSION: No acute displaced fracture or dislocation. Electronically Signed   By: Constance Holster M.D.   On: 12/04/2018 18:09   Ct Head Wo Contrast  Result Date: 12/04/2018 CLINICAL DATA:  Tremors EXAM: CT HEAD WITHOUT CONTRAST TECHNIQUE: Contiguous axial images were obtained from the base of the skull through the vertex without intravenous contrast. COMPARISON:  12/05/2012 FINDINGS: Brain: No evidence of acute infarction, hemorrhage, hydrocephalus, extra-axial collection or mass lesion/mass effect. There is mild age related volume loss. 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 Vascular: No hyperdense vessel or unexpected calcification. Skull: Normal. Negative for fracture or focal lesion. Sinuses/Orbits: There is a small amount of fluid in the mastoid air cells. Other: None. IMPRESSION: No acute intracranial abnormality detected. Electronically Signed   By: Constance Holster M.D.   On: 12/04/2018 18:06    Procedures Procedures (including critical care time)  Medications Ordered in ED Medications - No data to display   Initial Impression / Assessment and Plan / ED Course  I have reviewed the triage vital signs and the nursing notes.  Pertinent labs & imaging results that were available during my care of the patient were reviewed by me and considered in my medical decision  making (see chart for details).  Clinical Course as of Dec 06 1302  Thu Dec 06, 2018  1135 Head ct negative 2 days ago   [JK]    Clinical Course User Index [JK] Dorie Rank, MD     Patient presented to the ED for evaluation of epistaxis.  Patient has history of multiple medical problems.  He has been seen recently after fall and possible seizure.  No recurrent seizure activity here in the ED.  Laboratory  tests are stable.  He has chronic anemia that is unchanged and has chronic kidney disease but no acute electrolyte abnormalities.  Patient was monitored in the ED and had no recurrent bleeding.  Discussed management of recurrent episodes and to consider seeing an ENT doctor if he has recurrent events.  Final Clinical Impressions(s) / ED Diagnoses   Final diagnoses:  Epistaxis    ED Discharge Orders    None       Dorie Rank, MD 12/06/18 [phone]

## 2018-12-06 NOTE — ED Notes (Signed)
Blood draw attempted x 1 without success.

## 2018-12-06 NOTE — ED Notes (Signed)
Pt verbalized understanding of dc instructions.

## 2018-12-06 NOTE — Discharge Instructions (Addendum)
Continue your current medications, follow-up with your primary care doctor.  Apply pressure if you have recurrent bleeding.  Consider seeing an ear nose and throat doctor if you have recurrent episodes

## 2018-12-07 ENCOUNTER — Telehealth: Payer: Self-pay | Admitting: Family

## 2018-12-07 ENCOUNTER — Ambulatory Visit (INDEPENDENT_AMBULATORY_CARE_PROVIDER_SITE_OTHER): Payer: Medicare Other | Admitting: Family

## 2018-12-07 DIAGNOSIS — D631 Anemia in chronic kidney disease: Secondary | ICD-10-CM | POA: Diagnosis not present

## 2018-12-07 DIAGNOSIS — N186 End stage renal disease: Secondary | ICD-10-CM | POA: Diagnosis not present

## 2018-12-07 DIAGNOSIS — I4891 Unspecified atrial fibrillation: Secondary | ICD-10-CM | POA: Diagnosis not present

## 2018-12-07 DIAGNOSIS — I5042 Chronic combined systolic (congestive) and diastolic (congestive) heart failure: Secondary | ICD-10-CM | POA: Diagnosis not present

## 2018-12-07 DIAGNOSIS — Z992 Dependence on renal dialysis: Secondary | ICD-10-CM | POA: Diagnosis not present

## 2018-12-07 DIAGNOSIS — R569 Unspecified convulsions: Secondary | ICD-10-CM | POA: Diagnosis not present

## 2018-12-07 DIAGNOSIS — M25559 Pain in unspecified hip: Secondary | ICD-10-CM

## 2018-12-07 DIAGNOSIS — R791 Abnormal coagulation profile: Secondary | ICD-10-CM | POA: Diagnosis not present

## 2018-12-07 DIAGNOSIS — N2581 Secondary hyperparathyroidism of renal origin: Secondary | ICD-10-CM | POA: Diagnosis not present

## 2018-12-07 MED ORDER — FUROSEMIDE 40 MG PO TABS: ORAL_TABLET | ORAL | 1 refills | Status: DC

## 2018-12-07 MED ORDER — LEVETIRACETAM 500 MG PO TABS: 500.0000 mg | ORAL_TABLET | Freq: Two times a day (BID) | ORAL | 0 refills | Status: DC

## 2018-12-07 NOTE — Telephone Encounter (Signed)
Called both numbers- no answer. Left message on cell for pt to return my call today.

## 2018-12-07 NOTE — Telephone Encounter (Signed)
I returned pt's call- no answer x 2 on cell phone.

## 2018-12-07 NOTE — Telephone Encounter (Signed)
See phone visit

## 2018-12-07 NOTE — Progress Notes (Signed)
Virtual Visit via Telephone Note  I connected with Bruce Cole on 12/07/18 at 12:45 PM by telephone and verified that I am speaking with the correct person. This type of vi  Location: Patient: home  Provider: office   I discussed the limitations, risks, security and privacy concerns of performing an evaluation and management service by telephone and the availability of in person appointments. I also discussed with the patient that there may be a patient responsible charge related to this service. The patient expressed understanding and agreed to proceed.   History of Present Illness:  I saw this patient for a telephone visit on Dec 04, 2018.  Later that afternoon he apparently fell off his bed due to developing tremors.  During his ER visit he was noted to have a seizure-like episode which was witnessed by 2 nurses.  This included some twitching of his right arm followed by confusion.  He was given an IV bolus of Keppra and recommended to have outpatient neurology follow-up.  The following day on May 20 he returned to the ER.  This visit was prompted by an episode of slurred speech and altered mental status that occurred at hemodialysis.  He apparently was hypotensive during his dialysis treatment and seemed slow to answer.  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  He then returned on 12/06/2018 due to epistaxis.  Bleeding resolved.  INR in the ER that day was 4.4.  He is maintained on 7.5 mg of Coumadin once daily.    Observations/Objective:   Gen: Awake, alert, no acute distress Resp: Breathing sounds even and non-labored Psych: calm/pleasant demeanor Neuro: Alert and Oriented x 3, + facial symmetry, speech sounds clear.  Assessment and Plan:  New onset seizures- history of intermittent falls, confusion, and witnessed seizure-like activity.  He was given a loading dose of Keppra in the ED however he  states he did not receive a prescription for the oral Keppra.  I have sent a prescription for him for Keppra 500 mg twice daily.  He will need a follow-up Keppra level in 1 to 2 weeks.  Hopefully this can be drawn at his neuro neurology appointment or at hemodialysis.  If not we will need to bring him into our lab.  Supratherapeutic INR-I have advised the patient to hold Coumadin tonight.  He is then to start Coumadin 7.5 mg once daily with half tablet on Mondays and Fridays.  He will need follow-up INR in 1 week.  We did discuss setting him up for home INR monitor.  He is very interested in this and we will work on getting this approved through his insurance.  We did discuss the possibility of having him do home PT/INR monitoring.  He is very interested in this  Hip pain- a referral has been placed to orthopedics.  This is a limiting physical issue for him.   I did have a conversation separately with his son her son's request.  Son is concerned that he can no longer be alone and they are working on getting him additional help and or considering placement.  I did offer to complete an FL 2 should they needed.  He will let me know if they need anything further from [place].  I agree that he seems to be doing poorly at home.  However I think that the seizures are likely a major contributing factor to his falls and  confusion.  If we can get his seizures under better control and his hip feeling better perhaps he will do better independently. Follow Up Instructions:    I discussed the assessment and treatment plan with the patient. The patient was provided an opportunity to ask questions and all were answered. The patient agreed with the plan and demonstrated an understanding of the instructions.   The patient was advised to call back or seek an in-person evaluation if the symptoms worsen or if the condition fails to improve as anticipated.  I provided 21 minutes of non-face-to-face time during this  encounter.   Nance Pear, NP

## 2018-12-07 NOTE — Telephone Encounter (Signed)
Could you please call MD INR and initiate home INR monitoring system for patient please?  Phone: [phone]

## 2018-12-08 DIAGNOSIS — N186 End stage renal disease: Secondary | ICD-10-CM | POA: Diagnosis not present

## 2018-12-08 DIAGNOSIS — D631 Anemia in chronic kidney disease: Secondary | ICD-10-CM | POA: Diagnosis not present

## 2018-12-08 DIAGNOSIS — I4891 Unspecified atrial fibrillation: Secondary | ICD-10-CM | POA: Diagnosis not present

## 2018-12-08 DIAGNOSIS — N2581 Secondary hyperparathyroidism of renal origin: Secondary | ICD-10-CM | POA: Diagnosis not present

## 2018-12-08 DIAGNOSIS — Z992 Dependence on renal dialysis: Secondary | ICD-10-CM | POA: Diagnosis not present

## 2018-12-08 DIAGNOSIS — I5042 Chronic combined systolic (congestive) and diastolic (congestive) heart failure: Secondary | ICD-10-CM | POA: Diagnosis not present

## 2018-12-09 ENCOUNTER — Other Ambulatory Visit: Payer: Self-pay

## 2018-12-09 ENCOUNTER — Encounter (HOSPITAL_COMMUNITY): Payer: Self-pay | Admitting: Emergency Medicine

## 2018-12-09 ENCOUNTER — Observation Stay (HOSPITAL_COMMUNITY): Payer: Medicare Other

## 2018-12-09 ENCOUNTER — Emergency Department (HOSPITAL_COMMUNITY): Payer: Medicare Other

## 2018-12-09 ENCOUNTER — Inpatient Hospital Stay (HOSPITAL_COMMUNITY)
Admission: EM | Admit: 2018-12-09 | Discharge: 2018-12-13 | DRG: 100 | Disposition: A | Discharge status: Skilled Nursing Facility | Payer: Medicare Other | Attending: Internal Medicine | Admitting: Internal Medicine

## 2018-12-09 DIAGNOSIS — E8889 Other specified metabolic disorders: Secondary | ICD-10-CM | POA: Diagnosis present

## 2018-12-09 DIAGNOSIS — D631 Anemia in chronic kidney disease: Secondary | ICD-10-CM | POA: Diagnosis present

## 2018-12-09 DIAGNOSIS — I482 Chronic atrial fibrillation, unspecified: Secondary | ICD-10-CM | POA: Diagnosis not present

## 2018-12-09 DIAGNOSIS — Z87442 Personal history of urinary calculi: Secondary | ICD-10-CM

## 2018-12-09 DIAGNOSIS — I5042 Chronic combined systolic (congestive) and diastolic (congestive) heart failure: Secondary | ICD-10-CM | POA: Diagnosis present

## 2018-12-09 DIAGNOSIS — G9349 Other encephalopathy: Secondary | ICD-10-CM | POA: Diagnosis present

## 2018-12-09 DIAGNOSIS — Z86010 Personal history of colonic polyps: Secondary | ICD-10-CM

## 2018-12-09 DIAGNOSIS — Z992 Dependence on renal dialysis: Secondary | ICD-10-CM

## 2018-12-09 DIAGNOSIS — I4891 Unspecified atrial fibrillation: Secondary | ICD-10-CM | POA: Diagnosis present

## 2018-12-09 DIAGNOSIS — Z6835 Body mass index (BMI) 35.0-35.9, adult: Secondary | ICD-10-CM

## 2018-12-09 DIAGNOSIS — I10 Essential (primary) hypertension: Secondary | ICD-10-CM | POA: Diagnosis present

## 2018-12-09 DIAGNOSIS — B009 Herpesviral infection, unspecified: Secondary | ICD-10-CM | POA: Diagnosis present

## 2018-12-09 DIAGNOSIS — L539 Erythematous condition, unspecified: Secondary | ICD-10-CM | POA: Diagnosis present

## 2018-12-09 DIAGNOSIS — R569 Unspecified convulsions: Secondary | ICD-10-CM | POA: Diagnosis not present

## 2018-12-09 DIAGNOSIS — M069 Rheumatoid arthritis, unspecified: Secondary | ICD-10-CM | POA: Diagnosis present

## 2018-12-09 DIAGNOSIS — W06XXXA Fall from bed, initial encounter: Secondary | ICD-10-CM | POA: Diagnosis present

## 2018-12-09 DIAGNOSIS — Z7901 Long term (current) use of anticoagulants: Secondary | ICD-10-CM

## 2018-12-09 DIAGNOSIS — S3993XA Unspecified injury of pelvis, initial encounter: Secondary | ICD-10-CM | POA: Diagnosis not present

## 2018-12-09 DIAGNOSIS — R51 Headache: Secondary | ICD-10-CM | POA: Diagnosis not present

## 2018-12-09 DIAGNOSIS — Z20828 Contact with and (suspected) exposure to other viral communicable diseases: Secondary | ICD-10-CM | POA: Diagnosis present

## 2018-12-09 DIAGNOSIS — S299XXA Unspecified injury of thorax, initial encounter: Secondary | ICD-10-CM | POA: Diagnosis not present

## 2018-12-09 DIAGNOSIS — R600 Localized edema: Secondary | ICD-10-CM | POA: Diagnosis not present

## 2018-12-09 DIAGNOSIS — G4733 Obstructive sleep apnea (adult) (pediatric): Secondary | ICD-10-CM | POA: Diagnosis present

## 2018-12-09 DIAGNOSIS — I132 Hypertensive heart and chronic kidney disease with heart failure and with stage 5 chronic kidney disease, or end stage renal disease: Secondary | ICD-10-CM | POA: Diagnosis present

## 2018-12-09 DIAGNOSIS — R410 Disorientation, unspecified: Secondary | ICD-10-CM | POA: Diagnosis not present

## 2018-12-09 DIAGNOSIS — R278 Other lack of coordination: Secondary | ICD-10-CM | POA: Diagnosis present

## 2018-12-09 DIAGNOSIS — Z96641 Presence of right artificial hip joint: Secondary | ICD-10-CM | POA: Diagnosis present

## 2018-12-09 DIAGNOSIS — E1129 Type 2 diabetes mellitus with other diabetic kidney complication: Secondary | ICD-10-CM | POA: Diagnosis present

## 2018-12-09 DIAGNOSIS — E669 Obesity, unspecified: Secondary | ICD-10-CM | POA: Diagnosis present

## 2018-12-09 DIAGNOSIS — Y92003 Bedroom of unspecified non-institutional (private) residence as the place of occurrence of the external cause: Secondary | ICD-10-CM

## 2018-12-09 DIAGNOSIS — I959 Hypotension, unspecified: Secondary | ICD-10-CM | POA: Diagnosis not present

## 2018-12-09 DIAGNOSIS — S0003XA Contusion of scalp, initial encounter: Secondary | ICD-10-CM | POA: Diagnosis present

## 2018-12-09 DIAGNOSIS — Z8249 Family history of ischemic heart disease and other diseases of the circulatory system: Secondary | ICD-10-CM

## 2018-12-09 DIAGNOSIS — Z888 Allergy status to other drugs, medicaments and biological substances status: Secondary | ICD-10-CM

## 2018-12-09 DIAGNOSIS — R4781 Slurred speech: Secondary | ICD-10-CM | POA: Diagnosis present

## 2018-12-09 DIAGNOSIS — N186 End stage renal disease: Secondary | ICD-10-CM | POA: Diagnosis not present

## 2018-12-09 DIAGNOSIS — R296 Repeated falls: Secondary | ICD-10-CM | POA: Diagnosis present

## 2018-12-09 DIAGNOSIS — N2581 Secondary hyperparathyroidism of renal origin: Secondary | ICD-10-CM | POA: Diagnosis present

## 2018-12-09 DIAGNOSIS — E1122 Type 2 diabetes mellitus with diabetic chronic kidney disease: Secondary | ICD-10-CM | POA: Diagnosis present

## 2018-12-09 DIAGNOSIS — M5030 Other cervical disc degeneration, unspecified cervical region: Secondary | ICD-10-CM | POA: Diagnosis not present

## 2018-12-09 DIAGNOSIS — B029 Zoster without complications: Secondary | ICD-10-CM | POA: Diagnosis present

## 2018-12-09 DIAGNOSIS — M1611 Unilateral primary osteoarthritis, right hip: Secondary | ICD-10-CM | POA: Diagnosis present

## 2018-12-09 HISTORY — DX: Herpesviral infection, unspecified: B00.9

## 2018-12-09 LAB — CBC WITH DIFFERENTIAL/PLATELET
Abs Immature Granulocytes: 0.04 10*3/uL (ref 0.00–0.07)
Basophils Absolute: 0.1 10*3/uL (ref 0.0–0.1)
Basophils Relative: 1 %
Eosinophils Absolute: 0.4 10*3/uL (ref 0.0–0.5)
Eosinophils Relative: 4 %
HCT: 32.5 % — ABNORMAL LOW (ref 39.0–52.0)
Hemoglobin: 10.2 g/dL — ABNORMAL LOW (ref 13.0–17.0)
Immature Granulocytes: 1 %
Lymphocytes Relative: 17 %
Lymphs Abs: 1.5 10*3/uL (ref 0.7–4.0)
MCH: 30.4 pg (ref 26.0–34.0)
MCHC: 31.4 g/dL (ref 30.0–36.0)
MCV: 97 fL (ref 80.0–100.0)
Monocytes Absolute: 1.1 10*3/uL — ABNORMAL HIGH (ref 0.1–1.0)
Monocytes Relative: 13 %
Neutro Abs: 5.8 10*3/uL (ref 1.7–7.7)
Neutrophils Relative %: 64 %
Platelets: 222 10*3/uL (ref 150–400)
RBC: 3.35 MIL/uL — ABNORMAL LOW (ref 4.22–5.81)
RDW: 18.4 % — ABNORMAL HIGH (ref 11.5–15.5)
WBC: 8.8 10*3/uL (ref 4.0–10.5)
nRBC: 0 % (ref 0.0–0.2)

## 2018-12-09 LAB — COMPREHENSIVE METABOLIC PANEL
ALT: 15 U/L (ref 0–44)
AST: 25 U/L (ref 15–41)
Albumin: 3.1 g/dL — ABNORMAL LOW (ref 3.5–5.0)
Alkaline Phosphatase: 282 U/L — ABNORMAL HIGH (ref 38–126)
Anion gap: 18 — ABNORMAL HIGH (ref 5–15)
BUN: 40 mg/dL — ABNORMAL HIGH (ref 8–23)
CO2: 25 mmol/L (ref 22–32)
Calcium: 10.5 mg/dL — ABNORMAL HIGH (ref 8.9–10.3)
Chloride: 95 mmol/L — ABNORMAL LOW (ref 98–111)
Creatinine, Ser: 6.97 mg/dL — ABNORMAL HIGH (ref 0.61–1.24)
GFR calc Af Amer: 8 mL/min — ABNORMAL LOW (ref >60)
GFR calc non Af Amer: 7 mL/min — ABNORMAL LOW (ref >60)
Glucose, Bld: 146 mg/dL — ABNORMAL HIGH (ref 70–99)
Potassium: 3.9 mmol/L (ref 3.5–5.1)
Sodium: 138 mmol/L (ref 135–145)
Total Bilirubin: 0.6 mg/dL (ref 0.3–1.2)
Total Protein: 8 g/dL (ref 6.5–8.1)

## 2018-12-09 LAB — TROPONIN I
Troponin I: 0.08 ng/mL (ref <0.03)
Troponin I: 0.06 ng/mL (ref <0.03)
Troponin I: 0.07 ng/mL (ref <0.03)

## 2018-12-09 LAB — MAGNESIUM: Magnesium: 2.8 mg/dL — ABNORMAL HIGH (ref 1.7–2.4)

## 2018-12-09 LAB — SARS CORONAVIRUS 2 BY RT PCR (HOSPITAL ORDER, PERFORMED IN ~~LOC~~ HOSPITAL LAB): SARS Coronavirus 2: NEGATIVE

## 2018-12-09 LAB — CBG MONITORING, ED: Glucose-Capillary: 124 mg/dL — ABNORMAL HIGH (ref 70–99)

## 2018-12-09 LAB — PROTIME-INR
INR: 3.2 — ABNORMAL HIGH (ref 0.8–1.2)
Prothrombin Time: 32.4 seconds — ABNORMAL HIGH (ref 11.4–15.2)

## 2018-12-09 LAB — AMMONIA: Ammonia: 45 umol/L — ABNORMAL HIGH (ref 9–35)

## 2018-12-09 MED ORDER — SEVELAMER CARBONATE 800 MG PO TABS
2400.0000 mg | ORAL_TABLET | Freq: Three times a day (TID) | ORAL | Status: DC
  Administered 2018-12-09 – 2018-12-13 (×11): 2400 mg via ORAL
  Filled 2018-12-09 (×12): qty 3

## 2018-12-09 MED ORDER — ONDANSETRON HCL 4 MG PO TABS: 4.0000 mg | ORAL_TABLET | Freq: Four times a day (QID) | ORAL | Status: DC | PRN

## 2018-12-09 MED ORDER — HYDROXYZINE HCL 25 MG PO TABS: 25.0000 mg | ORAL_TABLET | Freq: Three times a day (TID) | ORAL | Status: DC | PRN

## 2018-12-09 MED ORDER — IOHEXOL 300 MG/ML  SOLN
100.0000 mL | Freq: Once | INTRAMUSCULAR | Status: AC | PRN
  Administered 2018-12-09: 100 mL via INTRAVENOUS

## 2018-12-09 MED ORDER — NEPRO/CARBSTEADY PO LIQD: 237.0000 mL | Freq: Three times a day (TID) | ORAL | Status: DC | PRN

## 2018-12-09 MED ORDER — DEXTROSE 5 % IV SOLN
500.0000 mg | INTRAVENOUS | Status: DC
  Filled 2018-12-09: qty 10

## 2018-12-09 MED ORDER — WARFARIN - PHARMACIST DOSING INPATIENT: Freq: Every day | Status: DC

## 2018-12-09 MED ORDER — CALCIUM CARBONATE ANTACID 1250 MG/5ML PO SUSP
500.0000 mg | Freq: Four times a day (QID) | ORAL | Status: DC | PRN
  Filled 2018-12-09: qty 5

## 2018-12-09 MED ORDER — PANTOPRAZOLE SODIUM 40 MG PO TBEC
40.0000 mg | DELAYED_RELEASE_TABLET | Freq: Every day | ORAL | Status: DC
  Administered 2018-12-10 – 2018-12-13 (×4): 40 mg via ORAL
  Filled 2018-12-09 (×4): qty 1

## 2018-12-09 MED ORDER — ATORVASTATIN CALCIUM 10 MG PO TABS
10.0000 mg | ORAL_TABLET | Freq: Every day | ORAL | Status: DC
  Administered 2018-12-10 – 2018-12-13 (×4): 10 mg via ORAL
  Filled 2018-12-09 (×4): qty 1

## 2018-12-09 MED ORDER — SODIUM CHLORIDE 0.9 % IV SOLN
1500.0000 mg | Freq: Once | INTRAVENOUS | Status: AC
  Administered 2018-12-09: 11:00:00 1500 mg via INTRAVENOUS
  Filled 2018-12-09: qty 30

## 2018-12-09 MED ORDER — ZOLPIDEM TARTRATE 5 MG PO TABS: 5.0000 mg | ORAL_TABLET | Freq: Every evening | ORAL | Status: DC | PRN

## 2018-12-09 MED ORDER — SORBITOL 70 % SOLN: 30.0000 mL | Status: DC | PRN

## 2018-12-09 MED ORDER — DEXTROSE 5 % IV SOLN
500.0000 mg | Freq: Once | INTRAVENOUS | Status: AC
  Administered 2018-12-09: 500 mg via INTRAVENOUS
  Filled 2018-12-09: qty 10

## 2018-12-09 MED ORDER — ACETAMINOPHEN 325 MG PO TABS
650.0000 mg | ORAL_TABLET | Freq: Four times a day (QID) | ORAL | Status: DC | PRN
  Filled 2018-12-09: qty 2

## 2018-12-09 MED ORDER — CAMPHOR-MENTHOL 0.5-0.5 % EX LOTN
1.0000 "application " | TOPICAL_LOTION | Freq: Three times a day (TID) | CUTANEOUS | Status: DC | PRN
  Filled 2018-12-09: qty 222

## 2018-12-09 MED ORDER — LEVETIRACETAM 500 MG PO TABS
500.0000 mg | ORAL_TABLET | Freq: Two times a day (BID) | ORAL | Status: DC
  Administered 2018-12-09 – 2018-12-10 (×2): 500 mg via ORAL
  Filled 2018-12-09 (×2): qty 1

## 2018-12-09 MED ORDER — DOCUSATE SODIUM 283 MG RE ENEM
1.0000 | ENEMA | RECTAL | Status: DC | PRN
  Filled 2018-12-09: qty 1

## 2018-12-09 MED ORDER — ACETAMINOPHEN 650 MG RE SUPP: 650.0000 mg | Freq: Four times a day (QID) | RECTAL | Status: DC | PRN

## 2018-12-09 MED ORDER — ONDANSETRON HCL 4 MG/2ML IJ SOLN
4.0000 mg | Freq: Four times a day (QID) | INTRAMUSCULAR | Status: DC | PRN
  Filled 2018-12-09: qty 2

## 2018-12-09 MED ORDER — SODIUM CHLORIDE 0.9 % IV BOLUS
500.0000 mL | Freq: Once | INTRAVENOUS | Status: AC
  Administered 2018-12-09: 500 mL via INTRAVENOUS

## 2018-12-09 MED ORDER — LORAZEPAM 2 MG/ML IJ SOLN: 1.0000 mg | INTRAMUSCULAR | Status: DC | PRN

## 2018-12-09 MED ORDER — TRAMADOL HCL 50 MG PO TABS
50.0000 mg | ORAL_TABLET | Freq: Two times a day (BID) | ORAL | Status: DC | PRN
  Administered 2018-12-11 (×2): 50 mg via ORAL
  Filled 2018-12-09 (×2): qty 1

## 2018-12-09 MED ORDER — GABAPENTIN 100 MG PO CAPS
100.0000 mg | ORAL_CAPSULE | Freq: Two times a day (BID) | ORAL | Status: DC
  Administered 2018-12-09 – 2018-12-12 (×6): 100 mg via ORAL
  Filled 2018-12-09 (×6): qty 1

## 2018-12-09 NOTE — Procedures (Signed)
History: 78 yo M being evaluated for seizure  Sedation: None  Technique: This is a 21 channel routine scalp EEG performed at the bedside with bipolar and monopolar montages arranged in accordance to the international 10/20 system of electrode placement. One channel was dedicated to EKG recording.    Background: The background consists predominantly of sleep, but with arousal, there is a poorly formed, poorly sustained posterior dominant rhythm of 7-8 Hz. Even with maximal wakefullness, there is intrusion into the background of diffuse, irregular delta and theta range activity.   Photic stimulation: Physiologic driving is not performed  EEG Abnormalities: 1) Generalized irregular slow activity 2) Slow PDR  Clinical Interpretation: This EEG is consistent with a generalized non-specific cerebral dysfunction(encephalopathy). There was no seizure or seizure predisposition recorded on this study. Please note that lack of epileptiform activity on EEG does not preclude the possibility of epilepsy.   Roland Rack, MD [facility] [phone]  If 7pm- 7am, please page neurology on call as listed in [place].

## 2018-12-09 NOTE — Progress Notes (Signed)
Pharmacy Antibiotic Note  Bruce Cole is a 78 y.o. 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  Pharmacy has been consulted for Acyclovir dosing.  The patient is ESRD-TTS, presumably last dialyzed 5/23.   Plan: - Start Acyclovir 500 mg (~5 mg/kg AdjBW) every 24 hours - F/u plans for LP for additional testing - Will continue to follow HD schedule/duration, culture results, LOT  Temp (24hrs), Avg:97.9 F (36.6 C), Min:97.9 F (36.6 C), Max:97.9 F (36.6 C)  Recent Labs  Lab 12/04/18 1647 12/05/18 1115 12/06/18 1150 12/09/18 0728  WBC 9.1 8.6 8.1 8.8  CREATININE 7.33* 4.64* 6.24* 6.97*    Estimated Creatinine Clearance: 12.4 mL/min (A) (by C-G formula based on SCr of 6.97 mg/dL (H)).    Allergies  Allergen Reactions  . Ace Inhibitors Other (See Comments)    Worsening renal insufficiency    Antimicrobials this admission: Acyclovir 5/24 >>  Dose adjustments this admission: n/a  Microbiology results: 5/24 COVID >> neg  Thank you for allowing pharmacy to be a part of this patient's care.  Alycia Rossetti, PharmD, BCPS Clinical Pharmacist Clinical phone for 12/09/2018: [phone] 12/09/2018 12:28 PM   **Pharmacist phone directory can now be found on [email or web address] ([email or web address]).  Listed under [place].

## 2018-12-09 NOTE — ED Notes (Signed)
Patient's son Bruce Cole updated on plan of care thus far, advised staff will contact him with update once disposition is set

## 2018-12-09 NOTE — ED Notes (Signed)
Patient transported to CT 

## 2018-12-09 NOTE — ED Notes (Signed)
Amy from EEG advised she will be down soon to perform test

## 2018-12-09 NOTE — Progress Notes (Signed)
EEG read formally - generalized irregular slowing suggestive of non specific cerebral dysfunction (encephalopathy).  Plan as before.  -- Amie Portland, MD [facility] Pager: [phone] If 7pm to 7am, please call on call as listed on AMION.

## 2018-12-09 NOTE — Progress Notes (Signed)
EEG completed, results pending. 

## 2018-12-09 NOTE — ED Provider Notes (Signed)
Emergency Department Provider Note   I have reviewed the triage vital signs and the nursing notes.   HISTORY  Chief Complaint Seizures   HPI Bruce Cole is a 78 y.o. male with PMH of ESRD, HTN, CHF, Chronic A-fib on Coumadin, and recent seizure diagnosis started on Keppra and is to the emergency department with possible breakthrough seizure.  He arrives by EMS.  Patient went to bed last night and woke up on the floor.  Family reported to EMS that the patient has had a seizure every day for the past week.  Fall from bed today was not witnessed.  Patient does have a small hematoma to the back of the head reports mild headache.  No numbness or weakness.   Patient does tell me that he has a history of "herpes" but unsure exactly which type.  He states he was first diagnosed several years ago.  He did begin to have an outbreak of his buttocks.  He talked to his PCP about this and she started him on Valtrex which she has been taking. This is his only new medication.   Spoke with the patient's son by phone who confirms the history provided by last.  He was aware of the Valtrex use but unsure regarding the specifics of this diagnosis.  He confirms the history that the patient has had almost daily seizures.  He tells me that he remains confused for 30 minutes to 1 hour after the seizure and then is back to his mental status baseline.   Past Medical History:  Diagnosis Date   Acute blood loss anemia    Atrial fibrillation (HCC)    Chronic   Chronic combined systolic and diastolic heart failure (HCC)    Colon polyp 2000   Dysrhythmia    hx   ESRD on hemodialysis ([place])    Hemo TTHS- [place]   Essential hypertension    Gastritis and gastroduodenitis    with bleeding   Gout    Heart murmur    Herpes    History of blood transfusion    Nephrolithiasis    OSA (obstructive sleep apnea) 09/02/2013    IMPRESSION :  1. Mild obstructive sleep apnea with hypopneas causing  sleep fragmentation and moderate oxygen desaturation.  2. Short runs of nonsustained VT were noted. His beta blocker may need to be titrated 3. Significant PLM's were noted, the PLM arousal index was low. Please correlate with clinical history of restless leg syndrome.  4. Sleep efficiency was poor.  RECOMMENDATION:  1. Treatment options for this degree of sleep disordered breathing include weight loss and positional therapy to avoid supine sleep 2. Consider titrating beta blocker further, defer to cardiologist 3. Patient should be cautioned against driving when sleepy.They should be asked to avoid medications with sedative side effects      Osteoarthritis of right hip    Pneumonia    hx 30 yrs ago   Primary osteoarthritis of right hip    Rheumatoid arthritis ([place])    Thrombocytopenia ([place])     Patient Active Problem List   Diagnosis Date Noted   Seizure ([place]) 12/09/2018   High risk medication use 03/17/2017   Hyperkalemia 08/01/2016   Aftercare following surgery of the circulatory system 06/17/2016   Status post total replacement of right hip 03/22/2016   ESRD on dialysis ([place]) 11/27/2015   OSA (obstructive sleep apnea) 09/02/2013   Chronic combined systolic and diastolic heart failure ([place]) 03/16/2013   Allergic rhinitis  12/18/2012   Nahlia Hellmann term current use of anticoagulant therapy 11/25/2010   Genital herpes 05/31/2010   DM (diabetes mellitus), type 2 with renal complications ([place]) 83/41/9622   ERECTILE DYSFUNCTION, ORGANIC 09/21/2009   Osteoarthritis 09/21/2009   PERSONAL HX COLONIC POLYPS 08/25/2009   Gout 07/22/2009   Essential hypertension 07/22/2009   ATRIAL FIBRILLATION 07/22/2009   Rheumatoid arthritis ([place]) 07/22/2009   NEPHROLITHIASIS, HX OF 07/22/2009    Past Surgical History:  Procedure Laterality Date   AV FISTULA PLACEMENT Left 08/26/2015   Procedure: LEFT RADIOCEPHALIC FISTULA CREATION;  Surgeon: Rosetta Posner, MD;  Location: [place];   Service: Vascular;  Laterality: Left;   AV FISTULA PLACEMENT Left 11/23/2015   Procedure: ARTERIOVENOUS (AV) FISTULA CREATION;  Surgeon: Rosetta Posner, MD;  Location: [place];  Service: Vascular;  Laterality: Left;   BACK SURGERY     x2- discectomy   CHOLECYSTECTOMY  1994   CYSTOSCOPY/RETROGRADE/URETEROSCOPY/STONE EXTRACTION WITH BASKET     ESOPHAGOGASTRODUODENOSCOPY N/A 11/13/2015   Procedure: ESOPHAGOGASTRODUODENOSCOPY (EGD);  Surgeon: Irene Shipper, MD;  Location[facility] ENDOSCOPY;  Service: Endoscopy;  Laterality: N/A;   INSERTION OF DIALYSIS CATHETER Left 11/23/2015   Procedure: INSERTION OF DIALYSIS CATHETER;  Surgeon: Rosetta Posner, MD;  Location: [place];  Service: Vascular;  Laterality: Left;   IR GENERIC HISTORICAL Left 08/04/2016   IR DIALY SHUNT INTRO NEEDLE/INTRACATH INITIAL W/IMG LEFT 08/04/2016 Markus Daft, MD [facility]INTER[facility] RAD   IR GENERIC HISTORICAL  08/04/2016   IR US GUIDE VASC ACCESS LEFT 08/04/2016 Markus Daft, MD [facility]   JOINT REPLACEMENT     Total L-Hip replacement, Right Knee 10/20/09   LEFT AND RIGHT HEART CATHETERIZATION WITH CORONARY ANGIOGRAM N/A 02/22/2013   Procedure: LEFT AND RIGHT HEART CATHETERIZATION WITH CORONARY ANGIOGRAM;  Surgeon: Jolaine Artist, MD;  Location: [facility] CATH LAB;  Service: Cardiovascular;  Laterality: N/A;   LITHOTRIPSY  90's   PERIPHERAL VASCULAR CATHETERIZATION Left 11/19/2015   Procedure: A/V/Fistulagram;  Surgeon: Conrad Galena, MD;  Location: [place] CV LAB;  Service: Cardiovascular;  Laterality: Left;   PERIPHERAL VASCULAR CATHETERIZATION Left 07/21/2016   Procedure: A/V Fistulagram;  Surgeon: Conrad [place], MD;  Location: [place] CV LAB;  Service: Cardiovascular;  Laterality: Left;  arm   REVISON OF ARTERIOVENOUS FISTULA Left 05/30/2016   Procedure: REVISION LEFT UPPER ARM FISTULA;  Surgeon: Conrad [place], MD;  Location: [place];  Service: Vascular;  Laterality: Left;   REVISON OF ARTERIOVENOUS FISTULA Left 07/22/2016   Procedure: REVISON OF  BASILIC VEIN TRANSPOSITION ANASTOMOSIS;  Surgeon: Rosetta Posner, MD;  Location: [place];  Service: Vascular;  Laterality: Left;   SPINE SURGERY     x 2   TOTAL HIP ARTHROPLASTY Right 03/22/2016   Procedure: RIGHT TOTAL HIP ARTHROPLASTY ANTERIOR APPROACH;  Surgeon: Mcarthur Rossetti, MD;  Location: [place];  Service: Orthopedics;  Laterality: Right;    Allergies Ace inhibitors  Family History  Problem Relation Age of Onset   Hypertension Mother    Arthritis Mother        ?RA   Hypertension Father     Social History Social History   Tobacco Use   Smoking status: Never Smoker   Smokeless tobacco: Never Used  Substance Use Topics   Alcohol use: No    Comment: occasional   Drug use: No    Review of Systems  Constitutional: No fever/chills Eyes: No visual changes. ENT: No sore throat. Cardiovascular: Denies chest pain. Respiratory: Denies shortness of breath.  Gastrointestinal: No abdominal pain.  No nausea, no vomiting.  No diarrhea.  No constipation. Genitourinary: Negative for dysuria. Musculoskeletal: Negative for back pain. Skin: Rash to the buttocks.  Neurological: Negative for headaches, focal weakness or numbness. Positive seizure daily for the last week.   10-point ROS otherwise negative.  ____________________________________________   PHYSICAL EXAM:  VITAL SIGNS: ED Triage Vitals  Enc Vitals Group     BP 12/09/18 0743 (!) 103/54     Pulse Rate 12/09/18 0743 64     Resp 12/09/18 0743 (!) 24     Temp 12/09/18 0743 97.9 F (36.6 C)     Temp Source 12/09/18 0743 Oral     SpO2 12/09/18 0743 98 %   Constitutional: Alert with mild confusion. Well appearing and in no acute distress. Eyes: Conjunctivae are normal.  Head: Atraumatic. Nose: No congestion/rhinnorhea. Mouth/Throat: Mucous membranes are moist. No tongue injury noted.  Neck: No stridor.  Cardiovascular: Normal rate, regular rhythm. Good peripheral circulation. Grossly normal heart sounds.    Respiratory: Normal respiratory effort.  No retractions. Lungs CTAB. Gastrointestinal: Soft and nontender. No distention.  Musculoskeletal: No lower extremity tenderness nor edema. No gross deformities of extremities. Normal ROM of upper and lower extremities.  Neurologic:  Normal speech and language. No gross focal neurologic deficits are appreciated.  Skin:  Skin is warm and dry. Two areas of skin breakdown over the right buttock. No surrounding erythema or induration. No clear herpetic lesions.   ____________________________________________   LABS (all labs ordered are listed, but only abnormal results are displayed)  Labs Reviewed  COMPREHENSIVE METABOLIC PANEL - Abnormal; Notable for the following components:      Result Value   Chloride 95 (*)    Glucose, Bld 146 (*)    BUN 40 (*)    Creatinine, Ser 6.97 (*)    Calcium 10.5 (*)    Albumin 3.1 (*)    Alkaline Phosphatase 282 (*)    GFR calc non Af Amer 7 (*)    GFR calc Af Amer 8 (*)    Anion gap 18 (*)    All other components within normal limits  TROPONIN I - Abnormal; Notable for the following components:   Troponin I 0.08 (*)    All other components within normal limits  CBC WITH DIFFERENTIAL/PLATELET - Abnormal; Notable for the following components:   RBC 3.35 (*)    Hemoglobin 10.2 (*)    HCT 32.5 (*)    RDW 18.4 (*)    Monocytes Absolute 1.1 (*)    All other components within normal limits  PROTIME-INR - Abnormal; Notable for the following components:   Prothrombin Time 32.4 (*)    INR 3.2 (*)    All other components within normal limits  MAGNESIUM - Abnormal; Notable for the following components:   Magnesium 2.8 (*)    All other components within normal limits  CBG MONITORING, ED - Abnormal; Notable for the following components:   Glucose-Capillary 124 (*)    All other components within normal limits  SARS CORONAVIRUS 2 (HOSPITAL ORDER, [place] [facility])  URINALYSIS, ROUTINE W REFLEX  MICROSCOPIC  RAPID URINE DRUG SCREEN, HOSP PERFORMED  TROPONIN I  TROPONIN I   ____________________________________________  EKG   EKG Interpretation  Date/Time:  Sunday Dec 09 2018 07:42:10 EDT Ventricular Rate:  64 PR Interval:    QRS Duration: 125 QT Interval:  437 QTC Calculation: 451 R Axis:   -63 Text Interpretation:  Atrial fibrillation  Ventricular premature complex Nonspecific IVCD with LAD Similar to prior. No STEMI.  Confirmed by Nanda Quinton [phone]) on 12/09/2018 8:08:19 AM       ____________________________________________  RADIOLOGY  Ct Head Wo Contrast  Result Date: 12/09/2018 CLINICAL DATA:  Seizure nontraumatic EXAM: CT HEAD WITHOUT CONTRAST CT CERVICAL SPINE WITHOUT CONTRAST TECHNIQUE: Multidetector CT imaging of the head and cervical spine was performed following the standard protocol without intravenous contrast. Multiplanar CT image reconstructions of the cervical spine were also generated. COMPARISON:  CT head 12/04/2018 FINDINGS: CT HEAD FINDINGS Brain: Mild atrophy. Mild chronic ischemic change in the white matter. Chronic infarct left cerebellum unchanged. Negative for acute infarct, hemorrhage, or mass. Vascular: Negative for hyperdense vessel Skull: Negative for skull fracture. Sinuses/Orbits: Mild mucosal edema paranasal sinuses. Negative orbit. Other: None CT CERVICAL SPINE FINDINGS Alignment: Normal Skull base and vertebrae: Negative for fracture Soft tissues and spinal canal: Extensive atherosclerotic calcification Large right submandibular calculus measuring 11 x 17 mm. No soft tissue mass. Disc levels: Disc and facet degeneration throughout the cervical spine causing spinal and foraminal stenosis at multiple levels. Upper chest: Negative Other: None IMPRESSION: 1. No acute intracranial abnormality. Chronic microvascular ischemic change in the white matter. Chronic infarct left cerebellum 2. Negative for cervical spine fracture 3. Large right submandibular  calculus 11 x 17 mm. 4. Atherosclerotic disease. Electronically Signed   By: Franchot Gallo M.D.   On: 12/09/2018 09:39   Ct Cervical Spine Wo Contrast  Result Date: 12/09/2018 CLINICAL DATA:  Seizure nontraumatic EXAM: CT HEAD WITHOUT CONTRAST CT CERVICAL SPINE WITHOUT CONTRAST TECHNIQUE: Multidetector CT imaging of the head and cervical spine was performed following the standard protocol without intravenous contrast. Multiplanar CT image reconstructions of the cervical spine were also generated. COMPARISON:  CT head 12/04/2018 FINDINGS: CT HEAD FINDINGS Brain: Mild atrophy. Mild chronic ischemic change in the white matter. Chronic infarct left cerebellum unchanged. Negative for acute infarct, hemorrhage, or mass. Vascular: Negative for hyperdense vessel Skull: Negative for skull fracture. Sinuses/Orbits: Mild mucosal edema paranasal sinuses. Negative orbit. Other: None CT CERVICAL SPINE FINDINGS Alignment: Normal Skull base and vertebrae: Negative for fracture Soft tissues and spinal canal: Extensive atherosclerotic calcification Large right submandibular calculus measuring 11 x 17 mm. No soft tissue mass. Disc levels: Disc and facet degeneration throughout the cervical spine causing spinal and foraminal stenosis at multiple levels. Upper chest: Negative Other: None IMPRESSION: 1. No acute intracranial abnormality. Chronic microvascular ischemic change in the white matter. Chronic infarct left cerebellum 2. Negative for cervical spine fracture 3. Large right submandibular calculus 11 x 17 mm. 4. Atherosclerotic disease. Electronically Signed   By: Franchot Gallo M.D.   On: 12/09/2018 09:39   Ct Pelvis W Contrast  Result Date: 12/09/2018 CLINICAL DATA:  Pain following fall EXAM: CT PELVIS WITH CONTRAST TECHNIQUE: Multidetector CT imaging of the pelvis was performed using the standard protocol following the bolus administration of intravenous contrast. CONTRAST:  151mL OMNIPAQUE IOHEXOL 300 MG/ML  SOLN  COMPARISON:  Pelvis radiographs Dec 04, 2018; CT abdomen and pelvis July 05, 2018 FINDINGS: Urinary Tract: There is extensive pelvic susceptibility artifact due to bilateral hip prostheses. Urinary bladder appears empty. There are several renal cysts noted bilaterally, largest measuring approximately 3 x 2 cm. No visualized hydronephrosis a period no ureteral calculi evident. No calculi in either visualized kidney. Bowel: Visualized bowel appears unremarkable without demonstrable obstruction or bowel wall thickening. Vascular/Lymphatic: There is distal aortic and iliac artery atherosclerosis. There is also calcification in each common  femoral artery. No aneurysm evident. No lower abdominal or pelvic adenopathy evident. Reproductive: There are prostatic calculi. Prostate and seminal vesicles appear normal in size and contour. Other: There is no abscess or ascites in the lower abdomen or pelvis. Appendix appears unremarkable. There is mild soft tissue edema in the posterolateral abdominal wall on either side. No soft tissue air or abscess noted in the lower abdominal or pelvic wall regions. Musculoskeletal: There are total hip replacements bilaterally with prosthetic components well-seated. Bones appear osteoporotic. No fracture or dislocation is evident. There is extensive disc space narrowing in the visualized lower lumbar spine. IMPRESSION: 1. Status post total hip replacements bilaterally with prosthetic components well-seated. No acute fracture or dislocation is demonstrable on this study. Bones appear osteoporotic. There is advanced arthropathy in the visualized lower lumbar spine. 2. Mild soft tissue edema in the posterolateral gluteal regions. No abscess or air noted in these areas. No fluid collections noted in the lower abdominal or pelvic wall regions. 3. Several renal cysts noted. No hydronephrosis. No ureteral calculus. Urinary bladder appears empty. There are prostatic calculi. 4.  There is aortic  and iliac artery atherosclerosis. 4. Visualized bowel appears unremarkable. No abscess in the lower abdomen or pelvis. Appendix appears unremarkable. Electronically Signed   By: Lowella Grip III M.D.   On: 12/09/2018 09:53   Dg Chest Portable 1 View  Result Date: 12/09/2018 CLINICAL DATA:  Unwitnessed fall.  Probable seizure EXAM: PORTABLE CHEST 1 VIEW COMPARISON:  Dec 04, 2018 FINDINGS: A portion of the lateral most aspect of the left base is not visualized. Visualized lungs show no evident edema or consolidation. There is stable cardiomegaly with pulmonary vascularity normal. No adenopathy. There is aortic atherosclerosis. No evident pneumothorax. Bony structures which are visualized appear unremarkable. IMPRESSION: No edema or consolidation. Note that a portion of the lateral left base is not visualized. Stable cardiomegaly. No pneumothorax. Aortic Atherosclerosis (ICD10-I70.0). Electronically Signed   By: Lowella Grip III M.D.   On: 12/09/2018 08:26    ____________________________________________   PROCEDURES  Procedure(s) performed:   Procedures  none ____________________________________________   INITIAL IMPRESSION / ASSESSMENT AND PLAN / ED COURSE  Pertinent labs & imaging results that were available during my care of the patient were reviewed by me and considered in my medical decision making (see chart for details).   Presents to the emergency department for evaluation of likely breakthrough seizure.  He woke up on the floor this morning with some confusion.  He has new history of seizure and has been recently started on Keppra on 12/04/18.  EMS report that family state he has had a seizure each day for the past week.  She does tell me that he has been treated with Valtrex for herpes outbreak with lesions on his buttock.  This, to me looks more like an area of gluteal ulceration.  No clear herpetic pattern.  Herpes encephalitis is a possibility.  LP would not be advised as  the patient is on Coumadin.  And to discuss with neurology.  10:30 AM  Imaging reviewed.  No acute abnormality on CT. creatinine near baseline.  Potassium 3.9.  Troponin is slightly elevated but within range of what is typical for the patient.  His INR is 3.2.  Discussed the case with Dr. Malen Gauze who recommends 1500 mg fosphenytoin and MRI of the brain without contrast given ESRD. He will consult. Plan for admit to hospitalist service.   Discussed patient's case with Hospitalist, Dr. Lorin Mercy to request admission.  Patient and family (if present) updated with plan. Care transferred to Hospitalist service.  I reviewed all nursing notes, vitals, pertinent old records, EKGs, labs, imaging (as available).  ____________________________________________  FINAL CLINICAL IMPRESSION(S) / ED DIAGNOSES  Final diagnoses:  Seizure-like activity ([place])  Disorientation     MEDICATIONS GIVEN DURING THIS VISIT:  Medications  sevelamer carbonate (RENVELA) tablet 2,400 mg (has no administration in time range)  traMADol (ULTRAM) tablet 50 mg (has no administration in time range)  atorvastatin (LIPITOR) tablet 10 mg (has no administration in time range)  pantoprazole (PROTONIX) EC tablet 40 mg (has no administration in time range)  gabapentin (NEURONTIN) capsule 100 mg (has no administration in time range)  levETIRAcetam (KEPPRA) tablet 500 mg (has no administration in time range)  acetaminophen (TYLENOL) tablet 650 mg (has no administration in time range)    Or  acetaminophen (TYLENOL) suppository 650 mg (has no administration in time range)  zolpidem (AMBIEN) tablet 5 mg (has no administration in time range)  sorbitol 70 % solution 30 mL (has no administration in time range)  docusate sodium (ENEMEEZ) enema 283 mg (has no administration in time range)  ondansetron (ZOFRAN) tablet 4 mg (has no administration in time range)    Or  ondansetron (ZOFRAN) injection 4 mg (has no administration in time range)    camphor-menthol (SARNA) lotion 1 application (has no administration in time range)    And  hydrOXYzine (ATARAX/VISTARIL) tablet 25 mg (has no administration in time range)  calcium carbonate (dosed in mg elemental calcium) suspension 500 mg of elemental calcium (has no administration in time range)  feeding supplement (NEPRO CARB STEADY) liquid 237 mL (has no administration in time range)  LORazepam (ATIVAN) injection 1-2 mg (has no administration in time range)  sodium chloride 0.9 % bolus 500 mL (0 mLs Intravenous Stopped 12/09/18 0942)  iohexol (OMNIPAQUE) 300 MG/ML solution 100 mL (100 mLs Intravenous Contrast Given 12/09/18 0912)  fosPHENYtoin (CEREBYX) 1,500 mg PE in sodium chloride 0.9 % 50 mL IVPB (1,500 mg PE Intravenous New Bag/Given 12/09/18 1118)    Note:  This document was prepared using Dragon voice recognition software and may include unintentional dictation errors.  Nanda Quinton, MD Emergency Medicine    Seif Teichert, [facility], MD 12/09/18 [phone]

## 2018-12-09 NOTE — ED Notes (Signed)
Dr. Karmen Bongo at bedside assessing patient at this time

## 2018-12-09 NOTE — ED Notes (Signed)
This RN contacted patient's son Cephus Shelling to update him on plan of care and bed assignment for patient.

## 2018-12-09 NOTE — ED Notes (Signed)
Patient called out, confused, asking where he is and asking "when can I speak to someone," this RN explained to patient why he is here in the ED and discussed his plan of care, advising that I would contact his son with an update as soon as his tests had resulted and a disposition for him was set. Pt stated "when am I going to a room so I can talk to my friends?" this RN offered to assist patient to contact his son.

## 2018-12-09 NOTE — ED Notes (Signed)
Patient assisted to contact his daughter so that he could update her on his plan of care.

## 2018-12-09 NOTE — ED Triage Notes (Signed)
Pt arrives via [facility] from home, states he got into bed last night and woke up on the floor, unsure how he got there. Had new onset seizure on Tuesday and was placed on keppra, supposed to see neurology this week. Per pts son, pt has had a seizure everyday for the past week. Pt has knot to posterior head, does take coumadin. Pt a/ox4, resp e/u, nad.  VS: hr 66, rr18 100% ra, 107/56 cbg 178 Hx of dialysis MWF-no missed sessions.

## 2018-12-09 NOTE — ED Notes (Addendum)
ED TO INPATIENT HANDOFF REPORT  ED Nurse Name and Phone #: Vikki Ports [phone]  S Name/Age/Gender Bruce Cole 78 y.o. male Room/Bed: 016C/016C  Code Status   Code Status: Full Code  Home/SNF/Other Home Patient oriented to: self, place, time and situation Is this baseline? Yes   Triage Complete: Triage complete  Chief Complaint sz  Triage Note Pt arrives via [facility] from home, states he got into bed last night and woke up on the floor, unsure how he got there. Had new onset seizure on Tuesday and was placed on keppra, supposed to see neurology this week. Per pts son, pt has had a seizure everyday for the past week. Pt has knot to posterior head, does take coumadin. Pt a/ox4, resp e/u, nad.  VS: hr 66, rr18 100% ra, 107/56 cbg 178 Hx of dialysis MWF-no missed sessions.   Allergies Allergies  Allergen Reactions  . Ace Inhibitors Other (See Comments)    Worsening renal insufficiency    Level of Care/Admitting Diagnosis ED Disposition    ED Disposition Condition Comment   Admit  Hospital Area: [facility] [[id number]]  Level of Care: Telemetry Medical [104]  I expect the patient will be discharged within 24 hours: Yes  LOW acuity---Tx typically complete <24 hrs---ACUTE conditions typically can be evaluated <24 hours---LABS likely to return to acceptable levels <24 hours---IS near functional baseline---EXPECTED to return to current living arrangement---NOT newly hypoxic: Meets criteria for 5C-Observation unit  Covid Evaluation: Screening Protocol (No Symptoms)  Diagnosis: Seizure ([place]) [[id number]]  Admitting Physician: Karmen Bongo [[id number]]  Attending Physician: Karmen Bongo [[id number]]  PT Class (Do Not Modify): Observation [104]  PT Acc Code (Do Not Modify): Observation [10022]       B Medical/Surgery History Past Medical History:  Diagnosis Date  . Acute blood loss anemia   . Atrial fibrillation (HCC)    Chronic  . Chronic combined systolic and diastolic  heart failure ([place])   . Colon polyp 2000  . Dysrhythmia    hx  . ESRD on hemodialysis ([place])    [facility]  . Essential hypertension   . Gastritis and gastroduodenitis    with bleeding  . Gout   . Heart murmur   . Herpes   . History of blood transfusion   . Nephrolithiasis   . OSA (obstructive sleep apnea) 09/02/2013    IMPRESSION :  1. Mild obstructive sleep apnea with hypopneas causing sleep fragmentation and moderate oxygen desaturation.  2. Short runs of nonsustained VT were noted. His beta blocker may need to be titrated 3. Significant PLM's were noted, the PLM arousal index was low. Please correlate with clinical history of restless leg syndrome.  4. Sleep efficiency was poor.  RECOMMENDATION:  1. Treatment options for this degree of sleep disordered breathing include weight loss and positional therapy to avoid supine sleep 2. Consider titrating beta blocker further, defer to cardiologist 3. Patient should be cautioned against driving when sleepy.They should be asked to avoid medications with sedative side effects     . Osteoarthritis of right hip   . Pneumonia    hx 30 yrs ago  . Primary osteoarthritis of right hip   . Rheumatoid arthritis ([place])   . Thrombocytopenia ([place])    Past Surgical History:  Procedure Laterality Date  . AV FISTULA PLACEMENT Left 08/26/2015   Procedure: LEFT RADIOCEPHALIC FISTULA CREATION;  Surgeon: Rosetta Posner, MD;  Location: [facility];  Service: Vascular;  Laterality: Left;  . AV  FISTULA PLACEMENT Left 11/23/2015   Procedure: ARTERIOVENOUS (AV) FISTULA CREATION;  Surgeon: Rosetta Posner, MD;  Location: [facility];  Service: Vascular;  Laterality: Left;  . BACK SURGERY     x2- discectomy  . CHOLECYSTECTOMY  1994  . CYSTOSCOPY/RETROGRADE/URETEROSCOPY/STONE EXTRACTION WITH BASKET    . ESOPHAGOGASTRODUODENOSCOPY N/A 11/13/2015   Procedure: ESOPHAGOGASTRODUODENOSCOPY (EGD);  Surgeon: Irene Shipper, MD;  Location: [facility] ENDOSCOPY;  Service: Endoscopy;   Laterality: N/A;  . INSERTION OF DIALYSIS CATHETER Left 11/23/2015   Procedure: INSERTION OF DIALYSIS CATHETER;  Surgeon: Rosetta Posner, MD;  Location: [facility];  Service: Vascular;  Laterality: Left;  . IR GENERIC HISTORICAL Left 08/04/2016   IR DIALY SHUNT INTRO NEEDLE/INTRACATH INITIAL W/IMG LEFT 08/04/2016 Markus Daft, MD [facility]INTER[facility] RAD  . IR GENERIC HISTORICAL  08/04/2016   IR US GUIDE VASC ACCESS LEFT 08/04/2016 Markus Daft, MD [facility]  . JOINT REPLACEMENT     Total L-Hip replacement, Right Knee 10/20/09  . LEFT AND RIGHT HEART CATHETERIZATION WITH CORONARY ANGIOGRAM N/A 02/22/2013   Procedure: LEFT AND RIGHT HEART CATHETERIZATION WITH CORONARY ANGIOGRAM;  Surgeon: Jolaine Artist, MD;  Location: [facility] CATH LAB;  Service: Cardiovascular;  Laterality: N/A;  . LITHOTRIPSY  90's  . PERIPHERAL VASCULAR CATHETERIZATION Left 11/19/2015   Procedure: A/V/Fistulagram;  Surgeon: Conrad [place], MD;  Location: [facility] CV LAB;  Service: Cardiovascular;  Laterality: Left;  . PERIPHERAL VASCULAR CATHETERIZATION Left 07/21/2016   Procedure: A/V Fistulagram;  Surgeon: Conrad Sumner, MD;  Location: [facility] CV LAB;  Service: Cardiovascular;  Laterality: Left;  arm  . REVISON OF ARTERIOVENOUS FISTULA Left 05/30/2016   Procedure: REVISION LEFT UPPER ARM FISTULA;  Surgeon: Conrad , MD;  Location: [facility];  Service: Vascular;  Laterality: Left;  . REVISON OF ARTERIOVENOUS FISTULA Left 07/22/2016   Procedure: REVISON OF BASILIC VEIN TRANSPOSITION ANASTOMOSIS;  Surgeon: Rosetta Posner, MD;  Location: [place] [facility] [place];  Service: Vascular;  Laterality: Left;  . SPINE SURGERY     x 2  . TOTAL HIP ARTHROPLASTY Right 03/22/2016   Procedure: RIGHT TOTAL HIP ARTHROPLASTY ANTERIOR APPROACH;  Surgeon: Mcarthur Rossetti, MD;  Location: [facility];  Service: Orthopedics;  Laterality: Right;     A IV Location/Drains/Wounds Patient Lines/Drains/Airways Status   Active Line/Drains/Airways    Name:   Placement date:   Placement time:   Site:    Days:   Peripheral IV 12/09/18 Right;Upper Arm   12/09/18    0753    Arm   less than 1   Fistula / Graft Left Upper arm Arteriovenous fistula   11/23/15    1014    Upper arm   1112          Intake/Output Last 24 hours No intake or output data in the 24 hours ending 12/09/18 1140  Labs/Imaging Results for orders placed or performed during the hospital encounter of 12/09/18 (from the past 48 hour(s))  Comprehensive metabolic panel     Status: Abnormal   Collection Time: 12/09/18  7:28 AM  Result Value Ref Range   Sodium 138 135 - 145 mmol/L   Potassium 3.9 3.5 - 5.1 mmol/L   Chloride 95 (L) 98 - 111 mmol/L   CO2 25 22 - 32 mmol/L   Glucose, Bld 146 (H) 70 - 99 mg/dL   BUN 40 (H) 8 - 23 mg/dL   Creatinine, Ser 6.97 (H) 0.61 - 1.24 mg/dL   Calcium 10.5 (H) 8.9 - 10.3 mg/dL  Total Protein 8.0 6.5 - 8.1 g/dL   Albumin 3.1 (L) 3.5 - 5.0 g/dL   AST 25 15 - 41 U/L   ALT 15 0 - 44 U/L   Alkaline Phosphatase 282 (H) 38 - 126 U/L   Total Bilirubin 0.6 0.3 - 1.2 mg/dL   GFR calc non Af Amer 7 (L) >60 mL/min   GFR calc Af Amer 8 (L) >60 mL/min   Anion gap 18 (H) 5 - 15    Comment: Performed at [facility] [address], [place], [place] [address]  Troponin I - Once     Status: Abnormal   Collection Time: 12/09/18  7:28 AM  Result Value Ref Range   Troponin I 0.08 (HH) <0.03 ng/mL    Comment: CRITICAL RESULT CALLED TO, READ BACK BY AND VERIFIED WITHBubba Hales AT 3790 12/09/2018 BY L BENFIELD Performed at [facility], [place] [address], [place], [place] [address]   CBC with Differential     Status: Abnormal   Collection Time: 12/09/18  7:28 AM  Result Value Ref Range   WBC 8.8 4.0 - 10.5 K/uL   RBC 3.35 (L) 4.22 - 5.81 MIL/uL   Hemoglobin 10.2 (L) 13.0 - 17.0 g/dL   HCT 32.5 (L) 39.0 - 52.0 %   MCV 97.0 80.0 - 100.0 fL   MCH 30.4 26.0 - 34.0 pg   MCHC 31.4 30.0 - 36.0 g/dL   RDW 18.4 (H) 11.5 - 15.5 %   Platelets 222 150 - 400 K/uL   nRBC 0.0 0.0 - 0.2 %    Neutrophils Relative % 64 %   Neutro Abs 5.8 1.7 - 7.7 K/uL   Lymphocytes Relative 17 %   Lymphs Abs 1.5 0.7 - 4.0 K/uL   Monocytes Relative 13 %   Monocytes Absolute 1.1 (H) 0.1 - 1.0 K/uL   Eosinophils Relative 4 %   Eosinophils Absolute 0.4 0.0 - 0.5 K/uL   Basophils Relative 1 %   Basophils Absolute 0.1 0.0 - 0.1 K/uL   Immature Granulocytes 1 %   Abs Immature Granulocytes 0.04 0.00 - 0.07 K/uL    Comment: Performed at [facility], [place] [address], [place], [place] [address]  Protime-INR     Status: Abnormal   Collection Time: 12/09/18  7:28 AM  Result Value Ref Range   Prothrombin Time 32.4 (H) 11.4 - 15.2 seconds   INR 3.2 (H) 0.8 - 1.2    Comment: (NOTE) INR goal varies based on device and disease states. Performed at [facility], [place] [address], [place], [place] [address]   Magnesium     Status: Abnormal   Collection Time: 12/09/18  7:28 AM  Result Value Ref Range   Magnesium 2.8 (H) 1.7 - 2.4 mg/dL    Comment: Performed at [facility] [address], [place],  [address]  CBG monitoring, ED     Status: Abnormal   Collection Time: 12/09/18  7:57 AM  Result Value Ref Range   Glucose-Capillary 124 (H) 70 - 99 mg/dL  SARS Coronavirus 2 (CEPHEID - Performed in [facility] hospital lab), Hosp Order     Status: None   Collection Time: 12/09/18  8:12 AM  Result Value Ref Range   SARS Coronavirus 2 NEGATIVE NEGATIVE    Comment: (NOTE) If result is NEGATIVE SARS-CoV-2 target nucleic acids are NOT DETECTED. The SARS-CoV-2 RNA is generally detectable in upper and lower  respiratory specimens during  the acute phase of infection. The lowest  concentration of SARS-CoV-2 viral copies this assay can detect is 250  copies / mL. A negative result does not preclude SARS-CoV-2 infection  and should not be used as the sole basis for treatment or other  patient management decisions.  A negative result may occur with  improper specimen collection / handling,  submission of specimen other  than nasopharyngeal swab, presence of viral mutation(s) within the  areas targeted by this assay, and inadequate number of viral copies  (<250 copies / mL). A negative result must be combined with clinical  observations, patient history, and epidemiological information. If result is POSITIVE SARS-CoV-2 target nucleic acids are DETECTED. The SARS-CoV-2 RNA is generally detectable in upper and lower  respiratory specimens dur ing the acute phase of infection.  Positive  results are indicative of active infection with SARS-CoV-2.  Clinical  correlation with patient history and other diagnostic information is  necessary to determine patient infection status.  Positive results do  not rule out bacterial infection or co-infection with other viruses. If result is PRESUMPTIVE POSTIVE SARS-CoV-2 nucleic acids MAY BE PRESENT.   A presumptive positive result was obtained on the submitted specimen  and confirmed on repeat testing.  While 2019 novel coronavirus  (SARS-CoV-2) nucleic acids may be present in the submitted sample  additional confirmatory testing may be necessary for epidemiological  and / or clinical management purposes  to differentiate between  SARS-CoV-2 and other Sarbecovirus currently known to infect humans.  If clinically indicated additional testing with an alternate test  methodology [phone]) is advised. The SARS-CoV-2 RNA is generally  detectable in upper and lower respiratory sp ecimens during the acute  phase of infection. The expected result is Negative. Fact Sheet for Patients:  StrictlyIdeas.no Fact Sheet for Healthcare Providers: BankingDealers.co.za This test is not yet approved or cleared by the [place] FDA and has been authorized for detection and/or diagnosis of SARS-CoV-2 by FDA under an Emergency Use Authorization (EUA).  This EUA will remain in effect (meaning this test can be  used) for the duration of the COVID-19 declaration under Section 564(b)(1) of the Act, 21 U.S.C. section 360bbb-3(b)(1), unless the authorization is terminated or revoked sooner. Performed at [facility], [place] [address], [place], [place] [address]    Ct Head Wo Contrast  Result Date: 12/09/2018 CLINICAL DATA:  Seizure nontraumatic EXAM: CT HEAD WITHOUT CONTRAST CT CERVICAL SPINE WITHOUT CONTRAST TECHNIQUE: Multidetector CT imaging of the head and cervical spine was performed following the standard protocol without intravenous contrast. Multiplanar CT image reconstructions of the cervical spine were also generated. COMPARISON:  CT head 12/04/2018 FINDINGS: CT HEAD FINDINGS Brain: Mild atrophy. Mild chronic ischemic change in the white matter. Chronic infarct left cerebellum unchanged. Negative for acute infarct, hemorrhage, or mass. Vascular: Negative for hyperdense vessel Skull: Negative for skull fracture. Sinuses/Orbits: Mild mucosal edema paranasal sinuses. Negative orbit. Other: None CT CERVICAL SPINE FINDINGS Alignment: Normal Skull base and vertebrae: Negative for fracture Soft tissues and spinal canal: Extensive atherosclerotic calcification Large right submandibular calculus measuring 11 x 17 mm. No soft tissue mass. Disc levels: Disc and facet degeneration throughout the cervical spine causing spinal and foraminal stenosis at multiple levels. Upper chest: Negative Other: None IMPRESSION: 1. No acute intracranial abnormality. Chronic microvascular ischemic change in the white matter. Chronic infarct left cerebellum 2. Negative for cervical spine fracture 3. Large right submandibular calculus 11 x 17 mm. 4. Atherosclerotic disease. Electronically Signed  By: Franchot Gallo M.D.   On: 12/09/2018 09:39   Ct Cervical Spine Wo Contrast  Result Date: 12/09/2018 CLINICAL DATA:  Seizure nontraumatic EXAM: CT HEAD WITHOUT CONTRAST CT CERVICAL SPINE WITHOUT CONTRAST TECHNIQUE: Multidetector CT  imaging of the head and cervical spine was performed following the standard protocol without intravenous contrast. Multiplanar CT image reconstructions of the cervical spine were also generated. COMPARISON:  CT head 12/04/2018 FINDINGS: CT HEAD FINDINGS Brain: Mild atrophy. Mild chronic ischemic change in the white matter. Chronic infarct left cerebellum unchanged. Negative for acute infarct, hemorrhage, or mass. Vascular: Negative for hyperdense vessel Skull: Negative for skull fracture. Sinuses/Orbits: Mild mucosal edema paranasal sinuses. Negative orbit. Other: None CT CERVICAL SPINE FINDINGS Alignment: Normal Skull base and vertebrae: Negative for fracture Soft tissues and spinal canal: Extensive atherosclerotic calcification Large right submandibular calculus measuring 11 x 17 mm. No soft tissue mass. Disc levels: Disc and facet degeneration throughout the cervical spine causing spinal and foraminal stenosis at multiple levels. Upper chest: Negative Other: None IMPRESSION: 1. No acute intracranial abnormality. Chronic microvascular ischemic change in the white matter. Chronic infarct left cerebellum 2. Negative for cervical spine fracture 3. Large right submandibular calculus 11 x 17 mm. 4. Atherosclerotic disease. Electronically Signed   By: Franchot Gallo M.D.   On: 12/09/2018 09:39   Ct Pelvis W Contrast  Result Date: 12/09/2018 CLINICAL DATA:  Pain following fall EXAM: CT PELVIS WITH CONTRAST TECHNIQUE: Multidetector CT imaging of the pelvis was performed using the standard protocol following the bolus administration of intravenous contrast. CONTRAST:  169mL OMNIPAQUE IOHEXOL 300 MG/ML  SOLN COMPARISON:  Pelvis radiographs Dec 04, 2018; CT abdomen and pelvis July 05, 2018 FINDINGS: Urinary Tract: There is extensive pelvic susceptibility artifact due to bilateral hip prostheses. Urinary bladder appears empty. There are several renal cysts noted bilaterally, largest measuring approximately 3 x 2 cm.  No visualized hydronephrosis a period no ureteral calculi evident. No calculi in either visualized kidney. Bowel: Visualized bowel appears unremarkable without demonstrable obstruction or bowel wall thickening. Vascular/Lymphatic: There is distal aortic and iliac artery atherosclerosis. There is also calcification in each common femoral artery. No aneurysm evident. No lower abdominal or pelvic adenopathy evident. Reproductive: There are prostatic calculi. Prostate and seminal vesicles appear normal in size and contour. Other: There is no abscess or ascites in the lower abdomen or pelvis. Appendix appears unremarkable. There is mild soft tissue edema in the posterolateral abdominal wall on either side. No soft tissue air or abscess noted in the lower abdominal or pelvic wall regions. Musculoskeletal: There are total hip replacements bilaterally with prosthetic components well-seated. Bones appear osteoporotic. No fracture or dislocation is evident. There is extensive disc space narrowing in the visualized lower lumbar spine. IMPRESSION: 1. Status post total hip replacements bilaterally with prosthetic components well-seated. No acute fracture or dislocation is demonstrable on this study. Bones appear osteoporotic. There is advanced arthropathy in the visualized lower lumbar spine. 2. Mild soft tissue edema in the posterolateral gluteal regions. No abscess or air noted in these areas. No fluid collections noted in the lower abdominal or pelvic wall regions. 3. Several renal cysts noted. No hydronephrosis. No ureteral calculus. Urinary bladder appears empty. There are prostatic calculi. 4.  There is aortic and iliac artery atherosclerosis. 4. Visualized bowel appears unremarkable. No abscess in the lower abdomen or pelvis. Appendix appears unremarkable. Electronically Signed   By: Lowella Grip III M.D.   On: 12/09/2018 09:53   Dg Chest Portable 1 View  Result Date: 12/09/2018 CLINICAL DATA:  Unwitnessed fall.   Probable seizure EXAM: PORTABLE CHEST 1 VIEW COMPARISON:  Dec 04, 2018 FINDINGS: A portion of the lateral most aspect of the left base is not visualized. Visualized lungs show no evident edema or consolidation. There is stable cardiomegaly with pulmonary vascularity normal. No adenopathy. There is aortic atherosclerosis. No evident pneumothorax. Bony structures which are visualized appear unremarkable. IMPRESSION: No edema or consolidation. Note that a portion of the lateral left base is not visualized. Stable cardiomegaly. No pneumothorax. Aortic Atherosclerosis (ICD10-I70.0). Electronically Signed   By: Lowella Grip III M.D.   On: 12/09/2018 08:26    Pending Labs Unresulted Labs (From admission, onward)    Start     Ordered   12/10/18 0500  Comprehensive metabolic panel  Tomorrow morning,   R     12/09/18 1135   12/10/18 0500  CBC  Tomorrow morning,   R     12/09/18 1135   12/09/18 1500  Troponin I - Now Then Q6H  Now then every 6 hours,   R     12/09/18 1135   12/09/18 0757  Urinalysis, Routine w reflex microscopic  ONCE - STAT,   STAT     12/09/18 0758   12/09/18 0757  Urine rapid drug screen (hosp performed)  ONCE - STAT,   STAT     12/09/18 0758          Vitals/Pain Today's Vitals   12/09/18 1100 12/09/18 1106 12/09/18 1126 12/09/18 1130  BP: 107/90  100/78 (!) 95/54  Pulse: (!) 58  (!) 57 (!) 53  Resp:      Temp:      TempSrc:      SpO2: 98%  99% 97%  PainSc:  Asleep      Isolation Precautions No active isolations  Medications Medications  sevelamer carbonate (RENVELA) tablet 2,400 mg (has no administration in time range)  traMADol (ULTRAM) tablet 50 mg (has no administration in time range)  atorvastatin (LIPITOR) tablet 10 mg (has no administration in time range)  pantoprazole (PROTONIX) EC tablet 40 mg (has no administration in time range)  gabapentin (NEURONTIN) capsule 100 mg (has no administration in time range)  levETIRAcetam (KEPPRA) tablet 500 mg (has no  administration in time range)  acetaminophen (TYLENOL) tablet 650 mg (has no administration in time range)    Or  acetaminophen (TYLENOL) suppository 650 mg (has no administration in time range)  zolpidem (AMBIEN) tablet 5 mg (has no administration in time range)  sorbitol 70 % solution 30 mL (has no administration in time range)  docusate sodium (ENEMEEZ) enema 283 mg (has no administration in time range)  ondansetron (ZOFRAN) tablet 4 mg (has no administration in time range)    Or  ondansetron (ZOFRAN) injection 4 mg (has no administration in time range)  camphor-menthol (SARNA) lotion 1 application (has no administration in time range)    And  hydrOXYzine (ATARAX/VISTARIL) tablet 25 mg (has no administration in time range)  calcium carbonate (dosed in mg elemental calcium) suspension 500 mg of elemental calcium (has no administration in time range)  feeding supplement (NEPRO CARB STEADY) liquid 237 mL (has no administration in time range)  LORazepam (ATIVAN) injection 1-2 mg (has no administration in time range)  sodium chloride 0.9 % bolus 500 mL (0 mLs Intravenous Stopped 12/09/18 0942)  iohexol (OMNIPAQUE) 300 MG/ML solution 100 mL (100 mLs Intravenous Contrast Given 12/09/18 0912)  fosPHENYtoin (CEREBYX) 1,500 mg PE in sodium chloride 0.9 %  50 mL IVPB (1,500 mg PE Intravenous New Bag/Given 12/09/18 1118)    Mobility walks with device High fall risk   Focused Assessments Neuro Assessment Handoff:  Swallow screen pass? Yes          Neuro Assessment: Within Defined Limits Neuro Checks:      Last Documented NIHSS Modified Score:   Has TPA been given? No If patient is a Neuro Trauma and patient is going to OR before floor call report to [facility] nurse: [phone] or [phone]     R Recommendations: See Admitting Provider Note  Report given to:   Additional Notes:   NEGATIVE covid test

## 2018-12-09 NOTE — Progress Notes (Addendum)
Patient arrived to [phone] Alert and oriented to self. Sternal rub applied to arouse patient. Seems very confused and somnolent  compared to pt baseline received in report. May be post-ictal. May have had seizure during transport. Very emotional, stating "Can I talk to my son?". Belongings at bedside. Call bell within reach. Nurse will continue monitor.

## 2018-12-09 NOTE — Consult Note (Addendum)
Neurology Consultation Reason for Consult: ?seizure /Encephalopathy Referring Physician: Dr. Lorin Mercy  CC: Seizures  History is obtained from: Medical Record and patient's son Bruce Cole via telephone  HPI: Bruce Cole is a 78 y.o. male male with PMH of ESRD (MWF HD, with last HD session on Saturday due to upcoming holiday), HTN, CHF, Chronic A-fib on Coumadin, and recent seizure diagnosis started on Keppra on 12/07/2018. Patient was brought to [facility] ED via EMS after patient was found on the floor bedside his bed this morning.  Patient is slightly confused and altered this am while examining him.   He does not recall exactly what happened but states he awoke on the floor by his bed this am, he does not know how or when this happened and was unable to get up off the floor.  He called his son to come and help him.  Patient believes he hit his head because it hurts. This fall from bed was unwitnessed.  Patient endorses that his whole body hurts, including shoulders and back.  He denies CP, SOB, dizziness or lightheadedness.    I spoke with the patient's son, Bruce Cole and he states that he has fallen multiple times this week.  Son states that on Tuesday this past week 12/04/2018 patient had seizure like activity involving both upper extremity shaking which lasted a few minutes with confusion noted afterwards.  Son confirms was evaluated in the ED, and discharged with a follow up with neurology  (scheduled this coming week), with multiple episodes of upper extremity shaking and confusion, with confusion lasting for about an hour after each episode.  Son also notes that patients ROM and ability to get around has deteriorated over the last couple of weeks (patient walks with a walker due to unsteady gait).   I confirmed medications listed in chart were accurate and confirmed patient is taking them, with the newest medication being Keppra ordered by PCP.   Son states that this morning he received a call from his  dad at 0600 that he was on the floor and needed help.  When son arrived, he noticed that things were knocked over as the patient had fallen into the items.  Son found him with slurred speech, no incontinence or seizure like activity was noted.    Neurology consulted for encephalopathy and seizure management  LKW: ~5 days ago tpa given?: no, not an acute stroke  Premorbid modified rankin scale: 3  ROS: A 14 point ROS was performed and is negative except as noted in the HPI.   Past Medical History:  Diagnosis Date  . Acute blood loss anemia   . Atrial fibrillation (HCC)    Chronic  . Chronic combined systolic and diastolic heart failure ([place])   . Colon polyp 2000  . Dysrhythmia    hx  . ESRD on hemodialysis ([place])    [place]  . Essential hypertension   . Gastritis and gastroduodenitis    with bleeding  . Gout   . Heart murmur   . Herpes   . History of blood transfusion   . Nephrolithiasis   . OSA (obstructive sleep apnea) 09/02/2013    IMPRESSION :  1. Mild obstructive sleep apnea with hypopneas causing sleep fragmentation and moderate oxygen desaturation.  2. Short runs of nonsustained VT were noted. His beta blocker may need to be titrated 3. Significant PLM's were noted, the PLM arousal index was low. Please correlate with clinical history of restless leg syndrome.  4. Sleep efficiency was poor.  RECOMMENDATION:  1. Treatment options for this degree of sleep disordered breathing include weight loss and positional therapy to avoid supine sleep 2. Consider titrating beta blocker further, defer to cardiologist 3. Patient should be cautioned against driving when sleepy.They should be asked to avoid medications with sedative side effects     . Osteoarthritis of right hip   . Pneumonia    hx 30 yrs ago  . Primary osteoarthritis of right hip   . Rheumatoid arthritis ([place])   . Thrombocytopenia ([place])      Family History  Problem Relation Age of Onset  .  Hypertension Mother   . Arthritis Mother        ?RA  . Hypertension Father     Social History:  reports that he has never smoked. He has never used smokeless tobacco. He reports that he does not drink alcohol or use drugs.  Exam: Current vital signs: BP (!) 95/54   Pulse (!) 53   Temp 97.9 F (36.6 C) (Oral)   Resp 19   SpO2 97%  Vital signs in last 24 hours: Temp:  [97.9 F (36.6 C)] 97.9 F (36.6 C) (05/24 0743) Pulse Rate:  [33-64] 53 (05/24 1130) Resp:  [19-24] 19 (05/24 1015) BP: (95-119)/(47-90) 95/54 (05/24 1130) SpO2:  [87 %-100 %] 97 % (05/24 1130)   Physical Exam  Constitutional: Appears well-developed and well-nourished.  Psych: Affect appropriate to situation Eyes: No scleral injection HENT: No OP obstrucion Head: Normocephalic, small hematoma noted on the back of his head Cardiovascular: Normal rate and irregular rhythm on monitor Respiratory: Effort normal, non-labored breathing GI: Soft.  No distension. There is no tenderness.  Skin: dry skin, non pitting edema on bilateral extremities, reddened area with small abrasion on left knee, buttocks has a rash concerning for possible herpes (according to primary)   Neuro: Mental Status: Patient is lethargic, alert, slightly confused on some details however is oriented to person, place, month when asked.[name] Had waxing and waning mentation on attending exam. Patient is not able to give a clear and coherent history. No signs of aphasia or neglect Poor attention and concentration Cranial Nerves: II: Visual Fields are full. Pupils are equal, round, and reactive to light.   III,IV, VI: EOMI without ptosis or diploplia.  V: Facial sensation is symmetric to temperature VII: Facial movement is symmetric.  VIII: hearing is intact to voice X: Uvula elevates symmetrically XI: Shoulder shrug is symmetric. XII: tongue is midline without atrophy or fasciculations.  Motor: Tone is normal. Bulk is normal. Generalized  weakness in all 4 extremities, bilateral uppers 3/5 ( states he has shoulder pain bilateral), bilateral upper extremity asterixis, bilaterally lowers 3/5 Sensory: Sensation is symmetric to light touch and temperature in the arms and legs. Cerebellar: FNF and HKS unable to participate  I have reviewed labs in epic and the results pertinent to this consultation are: CBC, BMP, INR   CBC Latest Ref Rng & Units 12/09/2018 12/06/2018 12/05/2018  WBC 4.0 - 10.5 K/uL 8.8 8.1 8.6  Hemoglobin 13.0 - 17.0 g/dL 10.2(L) 9.8(L) 10.4(L)  Hematocrit 39.0 - 52.0 % 32.5(L) 32.0(L) 32.3(L)  Platelets 150 - 400 K/uL 222 234 231   CMP Latest Ref Rng & Units 12/09/2018 12/06/2018 12/05/2018  Glucose 70 - 99 mg/dL 146(H) 144(H) 125(H)  BUN 8 - 23 mg/dL 40(H) 30(H) 13  Creatinine 0.61 - 1.24 mg/dL 6.97(H) 6.24(H) 4.64(H)  Sodium 135 - 145 mmol/L 138 137 138  Potassium  3.5 - 5.1 mmol/L 3.9 3.5 3.3(L)  Chloride 98 - 111 mmol/L 95(L) 97(L) 97(L)  CO2 22 - 32 mmol/L 25 29 28   Calcium 8.9 - 10.3 mg/dL 10.5(H) 10.2 9.4  Total Protein 6.5 - 8.1 g/dL 8.0 - -  Total Bilirubin 0.3 - 1.2 mg/dL 0.6 - -  Alkaline Phos 38 - 126 U/L 282(H) - -  AST 15 - 41 U/L 25 - -  ALT 0 - 44 U/L 15 - -    I have reviewed the images obtained: CT brain revealed Chronic left cerebellum infarct, mild atrophy, no hemorrhage or mass effect.   CT cervical spine negative for cervical spine fracture   Assessment:  Bruce Cole is a 78 y.o. male male with PMH of ESRD (MWF HD, with last HD session on Saturday due to upcoming holiday), HTN, CHF, Chronic A-fib on Coumadin, and recent seizure diagnosis started on Keppra on 12/07/2018. Patient was brought to [facility] ED via EMS after patient was found on the floor bedside his bed this morning.  Patient is slightly confused and altered this am while examining him and appears to have asterixis.  No other problems reported on ROS  But has a rash on the back - concern if this might be some form of  encephalitis given prior h/o genital herpes and new rashes  IMPRESSION  --Seizure -1 week history of tremors and confusion. Ct head negative for acute process. Not sure if these are true seizures or asterixis.  --Encephalopathy, etiology unclear possible HSV encephalitis, or postictal.  Patient with history of genital herpes and some buttock lesions concerning for ?HSV per chart review. Should consider LP and start acyclovir in the meantime.  INR is 3.2 and I would not recommend reversing it for the risk of predisposing for ischemic stroke.  Recommendations: 1) Asterixis vs. Possible Seizure -EEG -MRI brain  -Continue home Keppra at 500mg  BID 2/2 renal function -Fosphenytoin 1500 mg PE x1 - decision to continue based on clinical exam and EEG  -Neuro checks frequent  -Seizure precautions   2) Encephalopathy  -Start acyclovir IV - encephalitic dosage (pharmacy consult) -Hold coumadin. Do not reverse anticoagulation -Consider LP when/if appropriate (No LP now with INR 3.2) -Check reversible causes B12, RPR, TSH, B1 level  Neurology will follow up on above labs and exam. Please call for questions   Attending Neurohospitalist Addendum Patient seen and examined with APP/Resident. Agree with the history and physical as documented above. Agree with the plan as documented, which I helped formulate. I have independently reviewed the chart, obtained history, review of systems and examined the patient.I have personally reviewed pertinent head/neck/spine imaging (CT/MRI). Please feel free to call with any questions. --- Amie Portland, MD [facility] Pager: [phone]  If 7pm to 7am, please call on call as listed on AMION.

## 2018-12-09 NOTE — Progress Notes (Signed)
ANTICOAGULATION CONSULT NOTE - Initial Consult  Pharmacy Consult for Warfarin Indication: atrial fibrillation  Allergies  Allergen Reactions  . Ace Inhibitors Other (See Comments)    Worsening renal insufficiency    Patient Measurements:   Heparin Dosing Weight: n/a  Vital Signs: Temp: 97.9 F (36.6 C) (05/24 0743) Temp Source: Oral (05/24 0743) BP: 95/54 (05/24 1130) Pulse Rate: 53 (05/24 1130)  Labs: Recent Labs    12/06/18 1150 12/09/18 0728  HGB 9.8* 10.2*  HCT 32.0* 32.5*  PLT 234 222  LABPROT  --  32.4*  INR  --  3.2*  CREATININE 6.24* 6.97*  TROPONINI  --  0.08*    Estimated Creatinine Clearance: 12.4 mL/min (A) (by C-G formula based on SCr of 6.97 mg/dL (H)).   Medical History: Past Medical History:  Diagnosis Date  . Acute blood loss anemia   . Atrial fibrillation (HCC)    Chronic  . Chronic combined systolic and diastolic heart failure ([place])   . Colon polyp 2000  . Dysrhythmia    hx  . ESRD on hemodialysis ([place])    [place]  . Essential hypertension   . Gastritis and gastroduodenitis    with bleeding  . Gout   . Heart murmur   . Herpes   . History of blood transfusion   . Nephrolithiasis   . OSA (obstructive sleep apnea) 09/02/2013    IMPRESSION :  1. Mild obstructive sleep apnea with hypopneas causing sleep fragmentation and moderate oxygen desaturation.  2. Short runs of nonsustained VT were noted. His beta blocker may need to be titrated 3. Significant PLM's were noted, the PLM arousal index was low. Please correlate with clinical history of restless leg syndrome.  4. Sleep efficiency was poor.  RECOMMENDATION:  1. Treatment options for this degree of sleep disordered breathing include weight loss and positional therapy to avoid supine sleep 2. Consider titrating beta blocker further, defer to cardiologist 3. Patient should be cautioned against driving when sleepy.They should be asked to avoid medications with sedative side  effects     . Osteoarthritis of right hip   . Pneumonia    hx 30 yrs ago  . Primary osteoarthritis of right hip   . Rheumatoid arthritis ([place])   . Thrombocytopenia ([place])     Assessment: 61 YOM who presented on 5/24 with breakthrough seizures, s/p fall with small hematoma to back of head. The patient was on warfarin PTA for hx Afib. Admit INR 3.2 on PTA dose of 5 mg/day - last dose 5/23. Pharmacy consulted to resume dosing this admission.  INR today is SUPRAtherapeutic - will hold the dose today. Will also monitor for drug interactions with adjustments in neuro meds for seizure control.   Goal of Therapy:  INR 2-3 Monitor platelets by anticoagulation protocol: Yes   Plan:  - Hold Warfarin dose this evening - Will continue to monitor for any signs/symptoms of bleeding and will follow up with PT/INR in the a.m.   Thank you for allowing pharmacy to be a part of this patient's care.  Alycia Rossetti, PharmD, BCPS Clinical Pharmacist Clinical phone for 12/09/2018: [phone] 12/09/2018 11:50 AM   **Pharmacist phone directory can now be found on [email or web address] ([email or web address]).  Listed under [place].

## 2018-12-09 NOTE — H&P (Signed)
History and Physical    Bruce Cole AYT:[id number] DOB: 31-Oct-1940 DOA: 12/09/2018  PCP: Bruce Alar, Bruce Cole Consultants:  Nephrology; Bensimhon - cardiology; Ninfa Linden - orthopedics Patient coming from:  Home - lives with son; Bruce Cole: Bruce Cole, Bruce Cole, [phone]  Chief Complaint:  Seizures  HPI: Bruce Cole is a 78 y.o. male with medical history significant of RA; OSA; nephrolithiasis; HTN; ESRD on TTS HD; afib on Coumadin; and recent diagnosis with seizures; chronic combined CHF presenting with breakthrough seizures.  Upon my entering the room, the patient was again very somnolent and confused.  However, within a few minutes, he became more alert and conversant and was oriented and able to answer questions.  He reports "a while" of generalized tremors after which he becomes confused and disoriented.  He also reports "herpes" on his left buttock with intermittent use of Valtrex and not recently.  It is painful.  Patient was seen at [facility] on 4/28 for a fall but did not appear to have significant injury and did not hit his head.  He had a virtual visit with his PCP on 5/19 for "tremors" starting after the 4/28 fall.  He was referred to neurology and had a CT ordered to r/o SDH.  He went to the ER a few hours later because he fell out of bed during one of these tremors.  While in the ER, he had a witnessed episode of R arm twitching and then was confused for a few minutes after.  Dr. Rory Percy was consulted by telephone and recommended initiation of Keppra and outpatient neurology f/u.   He returned to the ER on 5/20 after developing slurred speech and AMS during HD.   This was thought to be associated with hypotension during HD.   Admission was recommended but he was "adamant that he is going home today and that he never should have been brought here."  He returned to the ER on the following day, 5/21, with epistaxis; labs were stable and he was discharged.  He had a virtual visit with his  PCP on 5/22.  The son "is concerned that he can no longer be alone and they are working on getting him additional help and or considering placement."  She felt that his seizures were the primary contributing factor to his falls and confusion.  Of note, he was seen on 11/22 for a "tender scabbed area on right lateral buttock".  This was thought to be resolving herpes zoster.  He was already on Neurontin (also tried Lyrica) and was ordered capsaicin cream; she later ordered lidoderm patches.  The patient thought that this was a flare up of HSV2 according to a 2/12 telephone call and she prescribed daily Valtrex that day.  Per his son, episodes were every few hours since Tuesday.  They have been escalating in time and now seem to rapidly fluctuate between altered and normal.  ED Course:  1 week ago - having seizures.  Witnessed by son at times.  He was seen at [facility] multiple recent times.  Started on [place] on 5/19, returned on 5/20 with another episode and refused admission.  PCP recently started Valtrex for "herpes" outbreak on his buttocks, on Valtrex.  On buttocks, appears like an ulceration not overly c/w HSV/zoster.  CT unremarkable.  Still a bit confused.  Neurology recommends 1500 mg fosphenytoin and MRI and they will see.  No LP due to Coumadin.  COVID negative.  Review of Systems: As per HPI; otherwise review of systems  reviewed and negative.  Limited by presumed post-ictal state.   Past Medical History:  Diagnosis Date   Acute blood loss anemia    Atrial fibrillation (HCC)    Chronic   Chronic combined systolic and diastolic heart failure (HCC)    Colon polyp 2000   Dysrhythmia    hx   ESRD on hemodialysis ([place])    Hemo TTHS- [place]   Essential hypertension    Gastritis and gastroduodenitis    with bleeding   Gout    Heart murmur    Herpes    History of blood transfusion    Nephrolithiasis    OSA (obstructive sleep apnea) 09/02/2013    IMPRESSION :  1. Mild  obstructive sleep apnea with hypopneas causing sleep fragmentation and moderate oxygen desaturation.  2. Short runs of nonsustained VT were noted. His beta blocker may need to be titrated 3. Significant PLM's were noted, the PLM arousal index was low. Please correlate with clinical history of restless leg syndrome.  4. Sleep efficiency was poor.  RECOMMENDATION:  1. Treatment options for this degree of sleep disordered breathing include weight loss and positional therapy to avoid supine sleep 2. Consider titrating beta blocker further, defer to cardiologist 3. Patient should be cautioned against driving when sleepy.They should be asked to avoid medications with sedative side effects      Osteoarthritis of right hip    Pneumonia    hx 30 yrs ago   Primary osteoarthritis of right hip    Rheumatoid arthritis ([place])    Thrombocytopenia ([place])     Past Surgical History:  Procedure Laterality Date   AV FISTULA PLACEMENT Left 08/26/2015   Procedure: LEFT RADIOCEPHALIC FISTULA CREATION;  Surgeon: Rosetta Posner, MD;  Location: [place];  Service: Vascular;  Laterality: Left;   AV FISTULA PLACEMENT Left 11/23/2015   Procedure: ARTERIOVENOUS (AV) FISTULA CREATION;  Surgeon: Rosetta Posner, MD;  Location: [place];  Service: Vascular;  Laterality: Left;   BACK SURGERY     x2- discectomy   CHOLECYSTECTOMY  1994   CYSTOSCOPY/RETROGRADE/URETEROSCOPY/STONE EXTRACTION WITH BASKET     ESOPHAGOGASTRODUODENOSCOPY N/A 11/13/2015   Procedure: ESOPHAGOGASTRODUODENOSCOPY (EGD);  Surgeon: Irene Shipper, MD;  Location: [facility] ENDOSCOPY;  Service: Endoscopy;  Laterality: N/A;   INSERTION OF DIALYSIS CATHETER Left 11/23/2015   Procedure: INSERTION OF DIALYSIS CATHETER;  Surgeon: Rosetta Posner, MD;  Location: [place];  Service: Vascular;  Laterality: Left;   IR GENERIC HISTORICAL Left 08/04/2016   IR DIALY SHUNT INTRO NEEDLE/INTRACATH INITIAL W/IMG LEFT 08/04/2016 Markus Daft, MD [facility]-INTER[facility] RAD   IR GENERIC HISTORICAL  08/04/2016    IR US GUIDE VASC ACCESS LEFT 08/04/2016 Markus Daft, MD [facility]   JOINT REPLACEMENT     Total L-Hip replacement, Right Knee 10/20/09   LEFT AND RIGHT HEART CATHETERIZATION WITH CORONARY ANGIOGRAM N/A 02/22/2013   Procedure: LEFT AND RIGHT HEART CATHETERIZATION WITH CORONARY ANGIOGRAM;  Surgeon: Jolaine Artist, MD;  Location: [facility] CATH LAB;  Service: Cardiovascular;  Laterality: N/A;   LITHOTRIPSY  90's   PERIPHERAL VASCULAR CATHETERIZATION Left 11/19/2015   Procedure: A/V/Fistulagram;  Surgeon: Conrad [place], MD;  Location: [place] CV LAB;  Service: Cardiovascular;  Laterality: Left;   PERIPHERAL VASCULAR CATHETERIZATION Left 07/21/2016   Procedure: A/V Fistulagram;  Surgeon: Conrad [place], MD;  Location: [place] CV LAB;  Service: Cardiovascular;  Laterality: Left;  arm   REVISON OF ARTERIOVENOUS FISTULA Left 05/30/2016   Procedure: REVISION LEFT UPPER ARM FISTULA;  Surgeon: Conrad [place], MD;  Location: [place];  Service: Vascular;  Laterality: Left;   REVISON OF ARTERIOVENOUS FISTULA Left 07/22/2016   Procedure: REVISON OF BASILIC VEIN TRANSPOSITION ANASTOMOSIS;  Surgeon: Rosetta Posner, MD;  Location: [place];  Service: Vascular;  Laterality: Left;   SPINE SURGERY     x 2   TOTAL HIP ARTHROPLASTY Right 03/22/2016   Procedure: RIGHT TOTAL HIP ARTHROPLASTY ANTERIOR APPROACH;  Surgeon: Mcarthur Rossetti, MD;  Location: [place];  Service: Orthopedics;  Laterality: Right;    Social History   Socioeconomic History   Marital status: Single    Spouse name: Not on file   Number of children: 3   Years of education: Not on file   Highest education level: Not on file  Occupational History   Occupation: retired Personnel officer: [employer] resource strain: Not on file   Food insecurity:    Worry: Not on file    Inability: Not on file   Transportation needs:    Medical: Not on file    Non-medical: Not on file  Tobacco Use   Smoking status:  Never Smoker   Smokeless tobacco: Never Used  Substance and Sexual Activity   Alcohol use: No    Comment: occasional   Drug use: No   Sexual activity: Not on file  Lifestyle   Physical activity:    Days per week: Not on file    Minutes per session: Not on file   Stress: Not on file  Relationships   Social connections:    Talks on phone: Not on file    Gets together: Not on file    Attends religious service: Not on file    Active member of club or organization: Not on file    Attends meetings of clubs or organizations: Not on file    Relationship status: Not on file   Intimate partner violence:    Fear of current or ex partner: Not on file    Emotionally abused: Not on file    Physically abused: Not on file    Forced sexual activity: Not on file  Other Topics Concern   Not on file  Social History Narrative   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   Admitted to [place] 11/25/15   Widowed   Never smoked   FULL CODE    Allergies  Allergen Reactions   Ace Inhibitors Other (See Comments)    Worsening renal insufficiency    Family History  Problem Relation Age of Onset   Hypertension Mother    Arthritis Mother        ?RA   Hypertension Father     Prior to Admission medications   Medication Sig Start Date End Date Taking? Authorizing Provider  acetaminophen (TYLENOL) 500 MG tablet Take 500 mg by mouth every 6 (six) hours as needed for mild pain.   Yes [provider]  atorvastatin (LIPITOR) 10 MG tablet TAKE 1 TABLET(10 MG) BY MOUTH DAILY Patient taking differently: Take 10 mg by mouth daily.  01/02/18  Yes Bruce Alar, Bruce Cole  furosemide (LASIX) 40 MG tablet TAKE 1 TABLET(40 MG) BY MOUTH TWICE DAILY Patient taking differently: Take 40 mg by mouth 2 (two) times daily.  12/07/18  Yes Bruce Alar, Bruce Cole  gabapentin (NEURONTIN) 100 MG capsule Take 100 mg by mouth 2 (two) times a day. 11/06/18  Yes [provider]  levETIRAcetam  (KEPPRA)  500 MG tablet Take 1 tablet (500 mg total) by mouth 2 (two) times daily. 12/07/18  Yes Bruce Alar, Bruce Cole  pantoprazole (PROTONIX) 40 MG tablet Take 1 tablet (40 mg total) by mouth daily. 10/09/18  Yes Bruce Alar, Bruce Cole  traMADol (ULTRAM) 50 MG tablet Take 50 mg by mouth 2 (two) times daily as needed. 11/14/18  Yes [provider]  valACYclovir (VALTREX) 500 MG tablet Take 1 tablet by mouth every other day 11/27/18  Yes Bruce Alar, Bruce Cole  warfarin (COUMADIN) 5 MG tablet Take 5 mg by mouth daily. 12/04/18  Yes [provider]  B Complex-C-Folic Acid (RENA-VITE RX) 1 MG TABS Take 1 tablet by mouth at bedtime.  02/18/16   [provider]  colchicine 0.6 MG tablet Take 1/2 tablet by mouth once as needed for gout flare. Call if no improvement 01/02/18   Bruce Alar, Bruce Cole  fluticasone (FLONASE) 50 MCG/ACT nasal spray INSTILL 2 SPRAYS INTO BOTH NOSTRILS EVERY DAY Patient not taking: Reported on 12/03/2018 01/02/18   Bruce Alar, Bruce Cole  hydroxychloroquine (PLAQUENIL) 200 MG tablet TAKE 1 TABLET(200 MG) BY MOUTH DAILY Patient not taking: Reported on 12/03/2018 10/09/18   Bruce Alar, Bruce Cole  lidocaine (LIDODERM) 5 % Place 1 patch onto the skin daily. Remove & Discard patch within 12 hours or as directed by MD Patient not taking: Reported on 12/03/2018 07/03/18   Bruce Alar, Bruce Cole  pregabalin (LYRICA) 50 MG capsule Take 1 tab by mouth once daily after hemodialysis Patient not taking: Reported on 12/09/2018 10/09/18   Bruce Alar, Bruce Cole  RENVELA 800 MG tablet Take 3 tablets (2,400 mg total) by mouth 3 (three) times daily with meals. Patient not taking: Reported on 12/09/2018 07/23/16   Lavina Hamman, MD  warfarin (COUMADIN) 7.5 MG tablet Take 1 tablet (7.5 mg total) by mouth daily at 6 PM. Patient not taking: Reported on 12/09/2018 12/05/18   Bruce Alar, Bruce Cole    Physical Exam: Vitals:   12/09/18 1200 12/09/18 1215 12/09/18 1310 12/09/18  1624  BP: (!) 104/57 (!) 98/54 (!) 99/35 (!) 107/51  Pulse: (!) 59 (!) 56 (!) 59   Resp: 20 20 (!) 22   Temp:   (!) 97.5 F (36.4 C)   TempSrc:   Axillary   SpO2: 100% 99% 92% 99%      General:  Initially obtunded, difficult to arouse, with nonsensical speech; became more alert during my evaluation with sensorial clearing.  By the completion of my evaluation, he was alert, oriented, and able to answer questions appropriately with only mild cognitive slowing.  Eyes:  PERRL, EOMI, normal lids, iris  ENT:  grossly normal hearing, lips & tongue, mmm  Neck:  no LAD, masses or thyromegaly  Cardiovascular:  Irregularly irregular but rate controlled, no m/r/g. No LE edema.   Respiratory:   CTA bilaterally with no wheezes/rales/rhonchi.  Normal respiratory effort.  Abdomen:  soft, NT, ND, NABS  Back:   normal alignment, no CVAT  Skin:  Excoriated/ulcerative rash in dermatomal distribution along left buttocks, lesions in various stages of healing, painful to touch         Musculoskeletal:  grossly normal tone BUE/BLE, good ROM, no bony abnormality  Lower extremity:  No LE edema.  Limited foot exam with no ulcerations.  2+ distal pulses.  Psychiatric:  See general  Neurologic:  CN 2-12 grossly intact, moves all extremities in coordinated fashion but with apparent cognitive slowing    Radiological Exams on Admission: Ct Head Wo Contrast  Result Date: 12/09/2018 CLINICAL DATA:  Seizure nontraumatic EXAM: CT HEAD WITHOUT CONTRAST CT CERVICAL SPINE WITHOUT CONTRAST TECHNIQUE: Multidetector CT imaging of the head and cervical spine was performed following the standard protocol without intravenous contrast. Multiplanar CT image reconstructions of the cervical spine were also generated. COMPARISON:  CT head 12/04/2018 FINDINGS: CT HEAD FINDINGS Brain: Mild atrophy. Mild chronic ischemic change in the white matter. Chronic infarct left cerebellum unchanged. Negative for acute infarct,  hemorrhage, or mass. Vascular: Negative for hyperdense vessel Skull: Negative for skull fracture. Sinuses/Orbits: Mild mucosal edema paranasal sinuses. Negative orbit. Other: None CT CERVICAL SPINE FINDINGS Alignment: Normal Skull base and vertebrae: Negative for fracture Soft tissues and spinal canal: Extensive atherosclerotic calcification Large right submandibular calculus measuring 11 x 17 mm. No soft tissue mass. Disc levels: Disc and facet degeneration throughout the cervical spine causing spinal and foraminal stenosis at multiple levels. Upper chest: Negative Other: None IMPRESSION: 1. No acute intracranial abnormality. Chronic microvascular ischemic change in the white matter. Chronic infarct left cerebellum 2. Negative for cervical spine fracture 3. Large right submandibular calculus 11 x 17 mm. 4. Atherosclerotic disease. Electronically Signed   By: Franchot Gallo M.D.   On: 12/09/2018 09:39   Ct Cervical Spine Wo Contrast  Result Date: 12/09/2018 CLINICAL DATA:  Seizure nontraumatic EXAM: CT HEAD WITHOUT CONTRAST CT CERVICAL SPINE WITHOUT CONTRAST TECHNIQUE: Multidetector CT imaging of the head and cervical spine was performed following the standard protocol without intravenous contrast. Multiplanar CT image reconstructions of the cervical spine were also generated. COMPARISON:  CT head 12/04/2018 FINDINGS: CT HEAD FINDINGS Brain: Mild atrophy. Mild chronic ischemic change in the white matter. Chronic infarct left cerebellum unchanged. Negative for acute infarct, hemorrhage, or mass. Vascular: Negative for hyperdense vessel Skull: Negative for skull fracture. Sinuses/Orbits: Mild mucosal edema paranasal sinuses. Negative orbit. Other: None CT CERVICAL SPINE FINDINGS Alignment: Normal Skull base and vertebrae: Negative for fracture Soft tissues and spinal canal: Extensive atherosclerotic calcification Large right submandibular calculus measuring 11 x 17 mm. No soft tissue mass. Disc levels: Disc and  facet degeneration throughout the cervical spine causing spinal and foraminal stenosis at multiple levels. Upper chest: Negative Other: None IMPRESSION: 1. No acute intracranial abnormality. Chronic microvascular ischemic change in the white matter. Chronic infarct left cerebellum 2. Negative for cervical spine fracture 3. Large right submandibular calculus 11 x 17 mm. 4. Atherosclerotic disease. Electronically Signed   By: Franchot Gallo M.D.   On: 12/09/2018 09:39   Ct Pelvis W Contrast  Result Date: 12/09/2018 CLINICAL DATA:  Pain following fall EXAM: CT PELVIS WITH CONTRAST TECHNIQUE: Multidetector CT imaging of the pelvis was performed using the standard protocol following the bolus administration of intravenous contrast. CONTRAST:  114mL OMNIPAQUE IOHEXOL 300 MG/ML  SOLN COMPARISON:  Pelvis radiographs Dec 04, 2018; CT abdomen and pelvis July 05, 2018 FINDINGS: Urinary Tract: There is extensive pelvic susceptibility artifact due to bilateral hip prostheses. Urinary bladder appears empty. There are several renal cysts noted bilaterally, largest measuring approximately 3 x 2 cm. No visualized hydronephrosis a period no ureteral calculi evident. No calculi in either visualized kidney. Bowel: Visualized bowel appears unremarkable without demonstrable obstruction or bowel wall thickening. Vascular/Lymphatic: There is distal aortic and iliac artery atherosclerosis. There is also calcification in each common femoral artery. No aneurysm evident. No lower abdominal or pelvic adenopathy evident. Reproductive: There are prostatic calculi. Prostate and seminal vesicles appear normal in size and contour. Other: There is no abscess or ascites in the lower  abdomen or pelvis. Appendix appears unremarkable. There is mild soft tissue edema in the posterolateral abdominal wall on either side. No soft tissue air or abscess noted in the lower abdominal or pelvic wall regions. Musculoskeletal: There are total hip  replacements bilaterally with prosthetic components well-seated. Bones appear osteoporotic. No fracture or dislocation is evident. There is extensive disc space narrowing in the visualized lower lumbar spine. IMPRESSION: 1. Status post total hip replacements bilaterally with prosthetic components well-seated. No acute fracture or dislocation is demonstrable on this study. Bones appear osteoporotic. There is advanced arthropathy in the visualized lower lumbar spine. 2. Mild soft tissue edema in the posterolateral gluteal regions. No abscess or air noted in these areas. No fluid collections noted in the lower abdominal or pelvic wall regions. 3. Several renal cysts noted. No hydronephrosis. No ureteral calculus. Urinary bladder appears empty. There are prostatic calculi. 4.  There is aortic and iliac artery atherosclerosis. 4. Visualized bowel appears unremarkable. No abscess in the lower abdomen or pelvis. Appendix appears unremarkable. Electronically Signed   By: Lowella Grip III M.D.   On: 12/09/2018 09:53   Mr Brain Wo Contrast  Result Date: 12/09/2018 CLINICAL DATA:  Seizures. One-week history of tremors and confusion. Encephalopathy, possibly HSV encephalitis or postictal. EXAM: MRI HEAD WITHOUT CONTRAST TECHNIQUE: Multiplanar, multiecho pulse sequences of the brain and surrounding structures were obtained without intravenous contrast. COMPARISON:  Head CT 12/09/2018 FINDINGS: Brain: The study is mildly motion degraded, including thin section imaging through the temporal lobes. The hippocampi are grossly symmetric in volume without focal signal abnormality identified. There is no evidence of acute infarct, intracranial hemorrhage, mass, midline shift, or extra-axial fluid collection. Scattered cerebral white matter T2 hyperintensities are nonspecific but compatible with minimal chronic small vessel ischemic disease. A small chronic infarct is again noted laterally in the left cerebellar hemisphere.  There is mild generalized cerebral atrophy. Vascular: The distal right vertebral artery is poorly visualized and may be congenitally hypoplastic. Other major intracranial vascular flow voids are preserved. Skull and upper cervical spine: Unremarkable bone marrow signal. Sinuses/Orbits: Unremarkable orbits. Subcentimeter right maxillary sinus mucous retention cyst. Small right and moderate left mastoid effusions. Other: None. IMPRESSION: 1. No acute intracranial abnormality. 2. Chronic left cerebellar infarct and minimal chronic small vessel ischemic disease in the cerebral white matter. Electronically Signed   By: Logan Bores M.D.   On: 12/09/2018 14:17   Dg Chest Portable 1 View  Result Date: 12/09/2018 CLINICAL DATA:  Unwitnessed fall.  Probable seizure EXAM: PORTABLE CHEST 1 VIEW COMPARISON:  Dec 04, 2018 FINDINGS: A portion of the lateral most aspect of the left base is not visualized. Visualized lungs show no evident edema or consolidation. There is stable cardiomegaly with pulmonary vascularity normal. No adenopathy. There is aortic atherosclerosis. No evident pneumothorax. Bony structures which are visualized appear unremarkable. IMPRESSION: No edema or consolidation. Note that a portion of the lateral left base is not visualized. Stable cardiomegaly. No pneumothorax. Aortic Atherosclerosis (ICD10-I70.0). Electronically Signed   By: Lowella Grip III M.D.   On: 12/09/2018 08:26    EKG: Independently reviewed.  Afib with rate 64; IVCD with no evidence of acute ischemia   Labs on Admission: I have personally reviewed the available labs and imaging studies at the time of the admission.  Pertinent labs:   Glucose 146 BUN 40/Creatinine 6.97/GFR 8 Calcium 10.5 Anion gap 18 Mag++ 2.8 AP 282 Albumin 3.1 Troponin 0.08 WBC 8.8 Hgb 10.2; stable INR 3.2  Assessment/Plan Principal Problem:  Seizure [facility]) Active Problems:   Essential hypertension   ATRIAL FIBRILLATION   DM (diabetes  mellitus), type 2 with renal complications (HCC)   Chronic combined systolic and diastolic heart failure (HCC)   OSA (obstructive sleep apnea)   ESRD on dialysis [facility])   Herpes   Seizure -Patient with recent onset of "tremors", reportedly after his fall on 4/28 but first seen for this on 5/19 -Negative head CT on 5/19 and also today (other than chronic changes and a "large right submandibular calculus 11 x 17 mm" - can f/u on this when no longer addressing acute issues) -He was started on Keppra on 5/19 but has continued to have daily episodes since -Apparently post-ictal upon my arrival for evaluation, and cleared while I was in the room; oriented x 3 with mild cognitive slowing by the completion of my evaluation -Previously started on Keppra without apparent improvement -He has been loaded with fosphenytoin -Neurology is consulting -MRI pending -STAT EEG pending -Check NH4 level  Herpes vs. Zoster with possible encephalitis -Dermatomal ulcerations in various stages of healing along left buttocks, more c/w zoster than with genital HSV recurrence -However, symptoms are more concerning for HSV encephalitis -Start IV Acyclovir, although it would be helpful to obtain LP first -LP cannot be considered at this time due to INR 3.2 -PCR can be abnormal for up to several days and so it is reasonable to treat for now  ESRD on HD -Patient on chronic TTS HD -Nephrology prn order set utilized -He does not appear to be volume overloaded or otherwise in need of acute HD -Dr. Jonnie Finner notified that patient will need HD -?whether ESRD could be contributing to encephalopathy  Afib  -Rate controlled without medication -Coumadin on hold for now pending possible LP  Chronic combined CHF -Hold Lasix  HTN -He does not appear to be taking anti-hypertensive medications at this time  DM -Last A1c was 6.2 -Appears reasonably controlled with medication -Will follow with fasting labs for now  Note:  This patient has been tested and is negative for the novel coronavirus COVID-19.  DVT prophylaxis: Coumadin Code Status:  Full - confirmed with patient Family Communication: None present; I spoke with the patient's son by telephone Disposition Plan:  Home once clinically improved Consults called: Neurology  Admission status: It is my clinical opinion that referral for OBSERVATION is reasonable and necessary in this patient based on the above information provided. The aforementioned taken together are felt to place the patient at high risk for further clinical deterioration. However it is anticipated that the patient may be medically stable for discharge from the hospital within 24 to 48 hours.    Karmen Bongo MD [facility]   How to contact the [facility] Attending or Consulting provider [place] or covering provider during after hours [place], for this patient?  1. Check the care team in [facility] and look for a) attending/consulting TRH provider listed and b) the [facility] team listed 2. Log into [email or web address] and use [facility]'s universal password to access. If you do not have the password, please contact the hospital operator. 3. Locate the [facility] provider you are looking for under [facility] and page to a number that you can be directly reached. 4. If you still have difficulty reaching the provider, please page the [facility] (Director on Call) for the Hospitalists listed on amion for assistance.   12/09/2018, 4:25 PM

## 2018-12-10 DIAGNOSIS — I132 Hypertensive heart and chronic kidney disease with heart failure and with stage 5 chronic kidney disease, or end stage renal disease: Secondary | ICD-10-CM | POA: Diagnosis present

## 2018-12-10 DIAGNOSIS — L539 Erythematous condition, unspecified: Secondary | ICD-10-CM | POA: Diagnosis present

## 2018-12-10 DIAGNOSIS — Z7401 Bed confinement status: Secondary | ICD-10-CM | POA: Diagnosis not present

## 2018-12-10 DIAGNOSIS — E8889 Other specified metabolic disorders: Secondary | ICD-10-CM | POA: Diagnosis present

## 2018-12-10 DIAGNOSIS — W06XXXA Fall from bed, initial encounter: Secondary | ICD-10-CM | POA: Diagnosis present

## 2018-12-10 DIAGNOSIS — G4733 Obstructive sleep apnea (adult) (pediatric): Secondary | ICD-10-CM | POA: Diagnosis present

## 2018-12-10 DIAGNOSIS — B009 Herpesviral infection, unspecified: Secondary | ICD-10-CM | POA: Diagnosis present

## 2018-12-10 DIAGNOSIS — E785 Hyperlipidemia, unspecified: Secondary | ICD-10-CM | POA: Diagnosis not present

## 2018-12-10 DIAGNOSIS — Z20828 Contact with and (suspected) exposure to other viral communicable diseases: Secondary | ICD-10-CM | POA: Diagnosis present

## 2018-12-10 DIAGNOSIS — Z7901 Long term (current) use of anticoagulants: Secondary | ICD-10-CM | POA: Diagnosis not present

## 2018-12-10 DIAGNOSIS — I482 Chronic atrial fibrillation, unspecified: Secondary | ICD-10-CM | POA: Diagnosis present

## 2018-12-10 DIAGNOSIS — A6000 Herpesviral infection of urogenital system, unspecified: Secondary | ICD-10-CM | POA: Diagnosis not present

## 2018-12-10 DIAGNOSIS — I10 Essential (primary) hypertension: Secondary | ICD-10-CM | POA: Diagnosis not present

## 2018-12-10 DIAGNOSIS — R4182 Altered mental status, unspecified: Secondary | ICD-10-CM | POA: Diagnosis not present

## 2018-12-10 DIAGNOSIS — Z96641 Presence of right artificial hip joint: Secondary | ICD-10-CM | POA: Diagnosis present

## 2018-12-10 DIAGNOSIS — K219 Gastro-esophageal reflux disease without esophagitis: Secondary | ICD-10-CM | POA: Diagnosis not present

## 2018-12-10 DIAGNOSIS — D631 Anemia in chronic kidney disease: Secondary | ICD-10-CM | POA: Diagnosis present

## 2018-12-10 DIAGNOSIS — R4781 Slurred speech: Secondary | ICD-10-CM | POA: Diagnosis present

## 2018-12-10 DIAGNOSIS — M069 Rheumatoid arthritis, unspecified: Secondary | ICD-10-CM | POA: Diagnosis present

## 2018-12-10 DIAGNOSIS — E1129 Type 2 diabetes mellitus with other diabetic kidney complication: Secondary | ICD-10-CM | POA: Diagnosis not present

## 2018-12-10 DIAGNOSIS — Z992 Dependence on renal dialysis: Secondary | ICD-10-CM | POA: Diagnosis not present

## 2018-12-10 DIAGNOSIS — M1611 Unilateral primary osteoarthritis, right hip: Secondary | ICD-10-CM | POA: Diagnosis present

## 2018-12-10 DIAGNOSIS — M2550 Pain in unspecified joint: Secondary | ICD-10-CM | POA: Diagnosis not present

## 2018-12-10 DIAGNOSIS — E1122 Type 2 diabetes mellitus with diabetic chronic kidney disease: Secondary | ICD-10-CM | POA: Diagnosis present

## 2018-12-10 DIAGNOSIS — N186 End stage renal disease: Secondary | ICD-10-CM | POA: Diagnosis present

## 2018-12-10 DIAGNOSIS — G40909 Epilepsy, unspecified, not intractable, without status epilepticus: Secondary | ICD-10-CM | POA: Diagnosis not present

## 2018-12-10 DIAGNOSIS — B029 Zoster without complications: Secondary | ICD-10-CM | POA: Diagnosis present

## 2018-12-10 DIAGNOSIS — N2581 Secondary hyperparathyroidism of renal origin: Secondary | ICD-10-CM | POA: Diagnosis present

## 2018-12-10 DIAGNOSIS — I4891 Unspecified atrial fibrillation: Secondary | ICD-10-CM | POA: Diagnosis not present

## 2018-12-10 DIAGNOSIS — R569 Unspecified convulsions: Secondary | ICD-10-CM | POA: Diagnosis present

## 2018-12-10 DIAGNOSIS — Y92003 Bedroom of unspecified non-institutional (private) residence as the place of occurrence of the external cause: Secondary | ICD-10-CM | POA: Diagnosis not present

## 2018-12-10 DIAGNOSIS — I959 Hypotension, unspecified: Secondary | ICD-10-CM | POA: Diagnosis not present

## 2018-12-10 DIAGNOSIS — R410 Disorientation, unspecified: Secondary | ICD-10-CM | POA: Diagnosis not present

## 2018-12-10 DIAGNOSIS — R2689 Other abnormalities of gait and mobility: Secondary | ICD-10-CM | POA: Diagnosis not present

## 2018-12-10 DIAGNOSIS — E669 Obesity, unspecified: Secondary | ICD-10-CM | POA: Diagnosis present

## 2018-12-10 DIAGNOSIS — R296 Repeated falls: Secondary | ICD-10-CM | POA: Diagnosis present

## 2018-12-10 DIAGNOSIS — G9349 Other encephalopathy: Secondary | ICD-10-CM | POA: Diagnosis present

## 2018-12-10 DIAGNOSIS — I5042 Chronic combined systolic (congestive) and diastolic (congestive) heart failure: Secondary | ICD-10-CM | POA: Diagnosis present

## 2018-12-10 DIAGNOSIS — R278 Other lack of coordination: Secondary | ICD-10-CM | POA: Diagnosis present

## 2018-12-10 DIAGNOSIS — M1990 Unspecified osteoarthritis, unspecified site: Secondary | ICD-10-CM | POA: Diagnosis not present

## 2018-12-10 DIAGNOSIS — K113 Abscess of salivary gland: Secondary | ICD-10-CM | POA: Diagnosis not present

## 2018-12-10 DIAGNOSIS — S0003XA Contusion of scalp, initial encounter: Secondary | ICD-10-CM | POA: Diagnosis present

## 2018-12-10 LAB — COMPREHENSIVE METABOLIC PANEL
ALT: 15 U/L (ref 0–44)
AST: 18 U/L (ref 15–41)
Albumin: 2.8 g/dL — ABNORMAL LOW (ref 3.5–5.0)
Alkaline Phosphatase: 265 U/L — ABNORMAL HIGH (ref 38–126)
Anion gap: 15 (ref 5–15)
BUN: 49 mg/dL — ABNORMAL HIGH (ref 8–23)
CO2: 26 mmol/L (ref 22–32)
Calcium: 9.8 mg/dL (ref 8.9–10.3)
Chloride: 97 mmol/L — ABNORMAL LOW (ref 98–111)
Creatinine, Ser: 8.2 mg/dL — ABNORMAL HIGH (ref 0.61–1.24)
GFR calc Af Amer: 7 mL/min — ABNORMAL LOW (ref >60)
GFR calc non Af Amer: 6 mL/min — ABNORMAL LOW (ref >60)
Glucose, Bld: 108 mg/dL — ABNORMAL HIGH (ref 70–99)
Potassium: 4.6 mmol/L (ref 3.5–5.1)
Sodium: 138 mmol/L (ref 135–145)
Total Bilirubin: 0.2 mg/dL — ABNORMAL LOW (ref 0.3–1.2)
Total Protein: 7 g/dL (ref 6.5–8.1)

## 2018-12-10 LAB — CBC
HCT: 29.6 % — ABNORMAL LOW (ref 39.0–52.0)
Hemoglobin: 9.4 g/dL — ABNORMAL LOW (ref 13.0–17.0)
MCH: 30.6 pg (ref 26.0–34.0)
MCHC: 31.8 g/dL (ref 30.0–36.0)
MCV: 96.4 fL (ref 80.0–100.0)
Platelets: 194 10*3/uL (ref 150–400)
RBC: 3.07 MIL/uL — ABNORMAL LOW (ref 4.22–5.81)
RDW: 18.6 % — ABNORMAL HIGH (ref 11.5–15.5)
WBC: 7.6 10*3/uL (ref 4.0–10.5)
nRBC: 0 % (ref 0.0–0.2)

## 2018-12-10 LAB — TROPONIN I: Troponin I: 0.07 ng/mL (ref <0.03)

## 2018-12-10 LAB — PROTIME-INR
INR: 3.5 — ABNORMAL HIGH (ref 0.8–1.2)
Prothrombin Time: 34.2 seconds — ABNORMAL HIGH (ref 11.4–15.2)

## 2018-12-10 MED ORDER — CHLORHEXIDINE GLUCONATE CLOTH 2 % EX PADS
6.0000 | MEDICATED_PAD | Freq: Every day | CUTANEOUS | Status: DC
  Administered 2018-12-11: 6 via TOPICAL

## 2018-12-10 MED ORDER — MIDODRINE HCL 5 MG PO TABS
10.0000 mg | ORAL_TABLET | ORAL | Status: DC
  Administered 2018-12-10: 10 mg via ORAL
  Filled 2018-12-10: qty 2

## 2018-12-10 MED ORDER — FERRIC CITRATE 1 GM 210 MG(FE) PO TABS
420.0000 mg | ORAL_TABLET | Freq: Three times a day (TID) | ORAL | Status: DC
  Administered 2018-12-10 – 2018-12-13 (×9): 420 mg via ORAL
  Filled 2018-12-10 (×12): qty 2

## 2018-12-10 MED ORDER — LEVETIRACETAM 500 MG PO TABS
500.0000 mg | ORAL_TABLET | Freq: Every day | ORAL | Status: DC
  Administered 2018-12-11 – 2018-12-13 (×3): 500 mg via ORAL
  Filled 2018-12-10 (×3): qty 1

## 2018-12-10 MED ORDER — ACYCLOVIR 400 MG PO TABS
200.0000 mg | ORAL_TABLET | Freq: Two times a day (BID) | ORAL | Status: DC
  Administered 2018-12-11 – 2018-12-13 (×5): 200 mg via ORAL
  Filled 2018-12-10 (×6): qty 0.5

## 2018-12-10 MED ORDER — MIDODRINE HCL 5 MG PO TABS
5.0000 mg | ORAL_TABLET | Freq: Two times a day (BID) | ORAL | Status: DC
  Administered 2018-12-10 – 2018-12-13 (×7): 5 mg via ORAL
  Filled 2018-12-10 (×10): qty 1

## 2018-12-10 MED ORDER — LEVETIRACETAM 250 MG PO TABS
250.0000 mg | ORAL_TABLET | ORAL | Status: DC
  Administered 2018-12-11: 250 mg via ORAL
  Filled 2018-12-10: qty 1

## 2018-12-10 MED ORDER — GUAIFENESIN ER 600 MG PO TB12
600.0000 mg | ORAL_TABLET | Freq: Two times a day (BID) | ORAL | Status: DC
  Administered 2018-12-10 – 2018-12-13 (×7): 600 mg via ORAL
  Filled 2018-12-10 (×7): qty 1

## 2018-12-10 MED ORDER — ACYCLOVIR 400 MG PO TABS
400.0000 mg | ORAL_TABLET | Freq: Once | ORAL | Status: AC
  Administered 2018-12-11: 400 mg via ORAL
  Filled 2018-12-10 (×2): qty 1

## 2018-12-10 MED ORDER — DOXYCYCLINE HYCLATE 100 MG PO TABS
100.0000 mg | ORAL_TABLET | Freq: Two times a day (BID) | ORAL | Status: DC
  Administered 2018-12-10 – 2018-12-13 (×7): 100 mg via ORAL
  Filled 2018-12-10 (×7): qty 1

## 2018-12-10 MED ORDER — DARBEPOETIN ALFA 150 MCG/0.3ML IJ SOSY
150.0000 ug | PREFILLED_SYRINGE | INTRAMUSCULAR | Status: DC
  Administered 2018-12-11 – 2018-12-12 (×2): 150 ug via INTRAVENOUS
  Filled 2018-12-10: qty 0.3

## 2018-12-10 MED ORDER — RENA-VITE PO TABS
1.0000 | ORAL_TABLET | Freq: Every day | ORAL | Status: DC
  Administered 2018-12-10 – 2018-12-12 (×3): 1 via ORAL
  Filled 2018-12-10 (×3): qty 1

## 2018-12-10 MED ORDER — SODIUM CHLORIDE 0.9 % IV SOLN
125.0000 mg | INTRAVENOUS | Status: DC
  Administered 2018-12-11: 125 mg via INTRAVENOUS
  Filled 2018-12-10 (×3): qty 10

## 2018-12-10 NOTE — Progress Notes (Signed)
PT has continued to be confused and has now become agitated and paranoid. PT states that we are trying to kill him because he has not gone to dialysis. RN explained to the PT that the time that he go to dialysis in the hospital will be different than his normal time at his dialysis center. PT continues to say that no one wants to help him. RN has assured pt several of times that he will go to dialysis, and that we will keep him updated. PT is also refusing to work with therapy, but then makes the remarks that nobody wants to help him up. RN has encouraged pt to work with therapies , but pt gets angry. Will continue to monitor.

## 2018-12-10 NOTE — Consult Note (Addendum)
[facility] Renal Consultation Note    Indication for Consultation:  Management of ESRD/hemodialysis; anemia, hypertension/volume and secondary hyperparathyroidism PCP: Debbrah Alar, NP  HPI: Bruce Cole is a 78 y.o. male with ESRD (MWF) on HD x 3 years with hx HTN now with chronic hypotension, RA, CHS, OSA, chronic coumadin for afib who last dialyzed 5/23 in anticipation of missing Monday due to the holiday per HD note.   He was brought to the EDW 5/24 due to being found on the floor bedside 5/24 and doesn't recall the events leading up to this. He was recent diagnosed with seizures  He has had an unstable month with multiple  falls, seizures, temors, ED visit to r/o SDH. He was recently started on valtrex for herpes outbreak on buttock. He was screened for CVOID (neg). 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 . Admission labs not particularly remarkable for ESRD.  Troponins are elevated and flat.  Admission CXR neg for volume excess. Head CT and MRI  neg acute findings.  EEG done since admission is nonspecific.  Today he is alert, but intermittently confused. Oriented to "[facility] (actually in his room", 2020 and "unfortunately Trump".  Cannot state the name of his HD unit but offers that he lives two door down from one of our providers. Unable to give significant history. No SOB. Legs often swollen - one more than the other due to football injury. Has had scabbed areas on right calf for a while but doesn't know why. Outpatient dialysis notes support decline in medical condition this month with increased falls/confusion but did start home PT. BP are chronically low with bradycaria 50 - 60s at dialysis unit. He is due for dialysis today.  Past Medical History:  Diagnosis Date  . Acute blood loss anemia   . Atrial fibrillation (HCC)     Chronic  . Chronic combined systolic and diastolic heart failure ([place])   . Colon polyp 2000  . Dysrhythmia    hx  . ESRD on hemodialysis ([place])    [place]  . Essential hypertension   . Gastritis and gastroduodenitis    with bleeding  . Gout   . Heart murmur   . Herpes   . History of blood transfusion   . Nephrolithiasis   . OSA (obstructive sleep apnea) 09/02/2013    IMPRESSION :  1. Mild obstructive sleep apnea with hypopneas causing sleep fragmentation and moderate oxygen desaturation.  2. Short runs of nonsustained VT were noted. His beta blocker may need to be titrated 3. Significant PLM's were noted, the PLM arousal index was low. Please correlate with clinical history of restless leg syndrome.  4. Sleep efficiency was poor.  RECOMMENDATION:  1. Treatment options for this degree of sleep disordered breathing include weight loss and positional therapy to avoid supine sleep 2. Consider titrating beta blocker further, defer to cardiologist 3. Patient should be cautioned against driving when sleepy.They should be asked to avoid medications with sedative side effects     . Osteoarthritis of right hip   . Pneumonia    hx 30 yrs ago  . Primary osteoarthritis of right hip   . Rheumatoid arthritis ([place])   . Thrombocytopenia ([place])    Past Surgical History:  Procedure Laterality Date  . AV FISTULA PLACEMENT Left 08/26/2015   Procedure:  LEFT RADIOCEPHALIC FISTULA CREATION;  Surgeon: Rosetta Posner, MD;  Location: [place] [facility];  Service: Vascular;  Laterality: Left;  . AV FISTULA PLACEMENT Left 11/23/2015   Procedure: ARTERIOVENOUS (AV) FISTULA CREATION;  Surgeon: Rosetta Posner, MD;  Location: [place];  Service: Vascular;  Laterality: Left;  . BACK SURGERY     x2- discectomy  . CHOLECYSTECTOMY  1994  . CYSTOSCOPY/RETROGRADE/URETEROSCOPY/STONE EXTRACTION WITH BASKET    . ESOPHAGOGASTRODUODENOSCOPY N/A 11/13/2015   Procedure: ESOPHAGOGASTRODUODENOSCOPY (EGD);  Surgeon: Irene Shipper, MD;   Location[facility] ENDOSCOPY;  Service: Endoscopy;  Laterality: N/A;  . INSERTION OF DIALYSIS CATHETER Left 11/23/2015   Procedure: INSERTION OF DIALYSIS CATHETER;  Surgeon: Rosetta Posner, MD;  Location: [place];  Service: Vascular;  Laterality: Left;  . IR GENERIC HISTORICAL Left 08/04/2016   IR DIALY SHUNT INTRO NEEDLE/INTRACATH INITIAL W/IMG LEFT 08/04/2016 Markus Daft, MD [facility]-INTERV RAD  . IR GENERIC HISTORICAL  08/04/2016   IR US GUIDE VASC ACCESS LEFT 08/04/2016 Markus Daft, MD [facility]  . JOINT REPLACEMENT     Total L-Hip replacement, Right Knee 10/20/09  . LEFT AND RIGHT HEART CATHETERIZATION WITH CORONARY ANGIOGRAM N/A 02/22/2013   Procedure: LEFT AND RIGHT HEART CATHETERIZATION WITH CORONARY ANGIOGRAM;  Surgeon: Jolaine Artist, MD;  Location: [facility] CATH LAB;  Service: Cardiovascular;  Laterality: N/A;  . LITHOTRIPSY  90's  . PERIPHERAL VASCULAR CATHETERIZATION Left 11/19/2015   Procedure: A/V/Fistulagram;  Surgeon: Conrad Bell Arthur, MD;  Location: [place] CV LAB;  Service: Cardiovascular;  Laterality: Left;  . PERIPHERAL VASCULAR CATHETERIZATION Left 07/21/2016   Procedure: A/V Fistulagram;  Surgeon: Conrad Salesville, MD;  Location: [place] CV LAB;  Service: Cardiovascular;  Laterality: Left;  arm  . REVISON OF ARTERIOVENOUS FISTULA Left 05/30/2016   Procedure: REVISION LEFT UPPER ARM FISTULA;  Surgeon: Conrad [place] Orange, MD;  Location: [place];  Service: Vascular;  Laterality: Left;  . REVISON OF ARTERIOVENOUS FISTULA Left 07/22/2016   Procedure: REVISON OF BASILIC VEIN TRANSPOSITION ANASTOMOSIS;  Surgeon: Rosetta Posner, MD;  Location: [place];  Service: Vascular;  Laterality: Left;  . SPINE SURGERY     x 2  . TOTAL HIP ARTHROPLASTY Right 03/22/2016   Procedure: RIGHT TOTAL HIP ARTHROPLASTY ANTERIOR APPROACH;  Surgeon: Mcarthur Rossetti, MD;  Location: [place];  Service: Orthopedics;  Laterality: Right;   Family History  Problem Relation Age of Onset  . Hypertension Mother   . Arthritis Mother        ?RA  .  Hypertension Father    Social History:  reports that he has never smoked. He has never used smokeless tobacco. He reports that he does not drink alcohol or use drugs. Allergies  Allergen Reactions  . Ace Inhibitors Other (See Comments)    Worsening renal insufficiency   Prior to Admission medications   Medication Sig Start Date End Date Taking? Authorizing Provider  acetaminophen (TYLENOL) 500 MG tablet Take 500 mg by mouth every 6 (six) hours as needed for mild pain.   Yes [provider]  atorvastatin (LIPITOR) 10 MG tablet TAKE 1 TABLET(10 MG) BY MOUTH DAILY Patient taking differently: Take 10 mg by mouth daily.  01/02/18  Yes Debbrah Alar, NP  furosemide (LASIX) 40 MG tablet TAKE 1 TABLET(40 MG) BY MOUTH TWICE DAILY Patient taking differently: Take 40 mg by mouth 2 (two) times daily.  12/07/18  Yes Debbrah Alar, NP  gabapentin (NEURONTIN) 100 MG capsule Take 100 mg by mouth 2 (two) times a  day. 11/06/18  Yes [provider]  levETIRAcetam (KEPPRA) 500 MG tablet Take 1 tablet (500 mg total) by mouth 2 (two) times daily. 12/07/18  Yes Debbrah Alar, NP  pantoprazole (PROTONIX) 40 MG tablet Take 1 tablet (40 mg total) by mouth daily. 10/09/18  Yes Debbrah Alar, NP  traMADol (ULTRAM) 50 MG tablet Take 50 mg by mouth 2 (two) times daily as needed. 11/14/18  Yes [provider]  valACYclovir (VALTREX) 500 MG tablet Take 1 tablet by mouth every other day 11/27/18  Yes Debbrah Alar, NP  warfarin (COUMADIN) 5 MG tablet Take 5 mg by mouth daily. 12/04/18  Yes [provider]  B Complex-C-Folic Acid (RENA-VITE RX) 1 MG TABS Take 1 tablet by mouth at bedtime.  02/18/16   [provider]  colchicine 0.6 MG tablet Take 1/2 tablet by mouth once as needed for gout flare. Call if no improvement 01/02/18   Debbrah Alar, NP  RENVELA 800 MG tablet Take 3 tablets (2,400 mg total) by mouth 3 (three) times daily with meals. Patient not  taking: Reported on 12/09/2018 07/23/16   Lavina Hamman, MD   Current Facility-Administered Medications  Medication Dose Route Frequency Provider Last Rate Last Dose  . acetaminophen (TYLENOL) tablet 650 mg  650 mg Oral Q6H PRN Karmen Bongo, MD       Or  . acetaminophen (TYLENOL) suppository 650 mg  650 mg Rectal Q6H PRN Karmen Bongo, MD      . acyclovir (ZOVIRAX) 500 mg in dextrose 5 % 100 mL IVPB  500 mg Intravenous Q24H Rolla Flatten, [facility]      . atorvastatin (LIPITOR) tablet 10 mg  10 mg Oral Daily Karmen Bongo, MD   10 mg at 12/10/18 0851  . calcium carbonate (dosed in mg elemental calcium) suspension 500 mg of elemental calcium  500 mg of elemental calcium Oral Q6H PRN Karmen Bongo, MD      . camphor-menthol [facility]) lotion 1 application  1 application Topical Z3G PRN Karmen Bongo, MD       And  . hydrOXYzine (ATARAX/VISTARIL) tablet 25 mg  25 mg Oral Q8H PRN Karmen Bongo, MD      . docusate sodium [facility]) enema 283 mg  1 enema Rectal PRN Karmen Bongo, MD      . feeding supplement (NEPRO CARB STEADY) liquid 237 mL  237 mL Oral TID PRN Karmen Bongo, MD      . gabapentin (NEURONTIN) capsule 100 mg  100 mg Oral BID Karmen Bongo, MD   100 mg at 12/10/18 0850  . levETIRAcetam (KEPPRA) tablet 500 mg  500 mg Oral BID Karmen Bongo, MD   500 mg at 12/10/18 0851  . LORazepam (ATIVAN) injection 1-2 mg  1-2 mg Intravenous Q2H PRN Karmen Bongo, MD      . ondansetron [facility]) tablet 4 mg  4 mg Oral Q6H PRN Karmen Bongo, MD       Or  . ondansetron [facility]) injection 4 mg  4 mg Intravenous Q6H PRN Karmen Bongo, MD      . pantoprazole (PROTONIX) EC tablet 40 mg  40 mg Oral Daily Karmen Bongo, MD   40 mg at 12/10/18 6440  . sevelamer carbonate (RENVELA) tablet 2,400 mg  2,400 mg Oral TID WC Karmen Bongo, MD   2,400 mg at 12/10/18 0851  . sorbitol 70 % solution 30 mL  30 mL Oral PRN Karmen Bongo, MD      . traMADol Veatrice Bourbon) tablet 50 mg  50 mg Oral BID  PRN Karmen Bongo, MD      . zolpidem [facility]) tablet 5 mg  5 mg Oral QHS PRN Karmen Bongo, MD       Labs: Basic Metabolic Panel: Recent Labs  Lab 12/06/18 1150 12/09/18 0728 12/10/18 0355  NA 137 138 138  K 3.5 3.9 4.6  CL 97* 95* 97*  CO2 29 25 26   GLUCOSE 144* 146* 108*  BUN 30* 40* 49*  CREATININE 6.24* 6.97* 8.20*  CALCIUM 10.2 10.5* 9.8   Liver Function Tests: Recent Labs  Lab 12/04/18 1647 12/09/18 0728 12/10/18 0355  AST 29 25 18   ALT 15 15 15   ALKPHOS 306* 282* 265*  BILITOT 0.5 0.6 0.2*  PROT 8.2* 8.0 7.0  ALBUMIN 3.2* 3.1* 2.8*   No results for input(s): LIPASE, AMYLASE in the last 168 hours. Recent Labs  Lab 12/09/18 1628  AMMONIA 45*   CBC: Recent Labs  Lab 12/04/18 1647 12/05/18 1115 12/06/18 1150 12/09/18 0728 12/10/18 0355  WBC 9.1 8.6 8.1 8.8 7.6  NEUTROABS 6.8 6.5  --  5.8  --   HGB 10.1* 10.4* 9.8* 10.2* 9.4*  HCT 32.9* 32.3* 32.0* 32.5* 29.6*  MCV 98.8 96.7 100.0 97.0 96.4  PLT 293 231 234 222 194   Cardiac Enzymes: Recent Labs  Lab 12/05/18 1421 12/09/18 0728 12/09/18 1628 12/09/18 2303 12/10/18 0355  TROPONINI 0.07* 0.08* 0.06* 0.07* 0.07*   CBG: Recent Labs  Lab 12/04/18 1644 12/09/18 0757  GLUCAP 84 124*   Iron Studies: No results for input(s): IRON, TIBC, TRANSFERRIN, FERRITIN in the last 72 hours. Studies/Results: Ct Head Wo Contrast  Result Date: 12/09/2018 CLINICAL DATA:  Seizure nontraumatic EXAM: CT HEAD WITHOUT CONTRAST CT CERVICAL SPINE WITHOUT CONTRAST TECHNIQUE: Multidetector CT imaging of the head and cervical spine was performed following the standard protocol without intravenous contrast. Multiplanar CT image reconstructions of the cervical spine were also generated. COMPARISON:  CT head 12/04/2018 FINDINGS: CT HEAD FINDINGS Brain: Mild atrophy. Mild chronic ischemic change in the white matter. Chronic infarct left cerebellum unchanged. Negative for acute infarct, hemorrhage, or mass. Vascular:  Negative for hyperdense vessel Skull: Negative for skull fracture. Sinuses/Orbits: Mild mucosal edema paranasal sinuses. Negative orbit. Other: None CT CERVICAL SPINE FINDINGS Alignment: Normal Skull base and vertebrae: Negative for fracture Soft tissues and spinal canal: Extensive atherosclerotic calcification Large right submandibular calculus measuring 11 x 17 mm. No soft tissue mass. Disc levels: Disc and facet degeneration throughout the cervical spine causing spinal and foraminal stenosis at multiple levels. Upper chest: Negative Other: None IMPRESSION: 1. No acute intracranial abnormality. Chronic microvascular ischemic change in the white matter. Chronic infarct left cerebellum 2. Negative for cervical spine fracture 3. Large right submandibular calculus 11 x 17 mm. 4. Atherosclerotic disease. Electronically Signed   By: Franchot Gallo M.D.   On: 12/09/2018 09:39   Ct Cervical Spine Wo Contrast  Result Date: 12/09/2018 CLINICAL DATA:  Seizure nontraumatic EXAM: CT HEAD WITHOUT CONTRAST CT CERVICAL SPINE WITHOUT CONTRAST TECHNIQUE: Multidetector CT imaging of the head and cervical spine was performed following the standard protocol without intravenous contrast. Multiplanar CT image reconstructions of the cervical spine were also generated. COMPARISON:  CT head 12/04/2018 FINDINGS: CT HEAD FINDINGS Brain: Mild atrophy. Mild chronic ischemic change in the white matter. Chronic infarct left cerebellum unchanged. Negative for acute infarct, hemorrhage, or mass. Vascular: Negative for hyperdense vessel Skull: Negative for skull fracture. Sinuses/Orbits: Mild mucosal edema paranasal sinuses. Negative orbit. Other: None CT CERVICAL  SPINE FINDINGS Alignment: Normal Skull base and vertebrae: Negative for fracture Soft tissues and spinal canal: Extensive atherosclerotic calcification Large right submandibular calculus measuring 11 x 17 mm. No soft tissue mass. Disc levels: Disc and facet degeneration throughout the  cervical spine causing spinal and foraminal stenosis at multiple levels. Upper chest: Negative Other: None IMPRESSION: 1. No acute intracranial abnormality. Chronic microvascular ischemic change in the white matter. Chronic infarct left cerebellum 2. Negative for cervical spine fracture 3. Large right submandibular calculus 11 x 17 mm. 4. Atherosclerotic disease. Electronically Signed   By: Franchot Gallo M.D.   On: 12/09/2018 09:39   Ct Pelvis W Contrast  Result Date: 12/09/2018 CLINICAL DATA:  Pain following fall EXAM: CT PELVIS WITH CONTRAST TECHNIQUE: Multidetector CT imaging of the pelvis was performed using the standard protocol following the bolus administration of intravenous contrast. CONTRAST:  158mL OMNIPAQUE IOHEXOL 300 MG/ML  SOLN COMPARISON:  Pelvis radiographs Dec 04, 2018; CT abdomen and pelvis July 05, 2018 FINDINGS: Urinary Tract: There is extensive pelvic susceptibility artifact due to bilateral hip prostheses. Urinary bladder appears empty. There are several renal cysts noted bilaterally, largest measuring approximately 3 x 2 cm. No visualized hydronephrosis a period no ureteral calculi evident. No calculi in either visualized kidney. Bowel: Visualized bowel appears unremarkable without demonstrable obstruction or bowel wall thickening. Vascular/Lymphatic: There is distal aortic and iliac artery atherosclerosis. There is also calcification in each common femoral artery. No aneurysm evident. No lower abdominal or pelvic adenopathy evident. Reproductive: There are prostatic calculi. Prostate and seminal vesicles appear normal in size and contour. Other: There is no abscess or ascites in the lower abdomen or pelvis. Appendix appears unremarkable. There is mild soft tissue edema in the posterolateral abdominal wall on either side. No soft tissue air or abscess noted in the lower abdominal or pelvic wall regions. Musculoskeletal: There are total hip replacements bilaterally with prosthetic  components well-seated. Bones appear osteoporotic. No fracture or dislocation is evident. There is extensive disc space narrowing in the visualized lower lumbar spine. IMPRESSION: 1. Status post total hip replacements bilaterally with prosthetic components well-seated. No acute fracture or dislocation is demonstrable on this study. Bones appear osteoporotic. There is advanced arthropathy in the visualized lower lumbar spine. 2. Mild soft tissue edema in the posterolateral gluteal regions. No abscess or air noted in these areas. No fluid collections noted in the lower abdominal or pelvic wall regions. 3. Several renal cysts noted. No hydronephrosis. No ureteral calculus. Urinary bladder appears empty. There are prostatic calculi. 4.  There is aortic and iliac artery atherosclerosis. 4. Visualized bowel appears unremarkable. No abscess in the lower abdomen or pelvis. Appendix appears unremarkable. Electronically Signed   By: Lowella Grip III M.D.   On: 12/09/2018 09:53   Mr Brain Wo Contrast  Result Date: 12/09/2018 CLINICAL DATA:  Seizures. One-week history of tremors and confusion. Encephalopathy, possibly HSV encephalitis or postictal. EXAM: MRI HEAD WITHOUT CONTRAST TECHNIQUE: Multiplanar, multiecho pulse sequences of the brain and surrounding structures were obtained without intravenous contrast. COMPARISON:  Head CT 12/09/2018 FINDINGS: Brain: The study is mildly motion degraded, including thin section imaging through the temporal lobes. The hippocampi are grossly symmetric in volume without focal signal abnormality identified. There is no evidence of acute infarct, intracranial hemorrhage, mass, midline shift, or extra-axial fluid collection. Scattered cerebral white matter T2 hyperintensities are nonspecific but compatible with minimal chronic small vessel ischemic disease. A small chronic infarct is again noted laterally in the left cerebellar hemisphere. There  is mild generalized cerebral atrophy.  Vascular: The distal right vertebral artery is poorly visualized and may be congenitally hypoplastic. Other major intracranial vascular flow voids are preserved. Skull and upper cervical spine: Unremarkable bone marrow signal. Sinuses/Orbits: Unremarkable orbits. Subcentimeter right maxillary sinus mucous retention cyst. Small right and moderate left mastoid effusions. Other: None. IMPRESSION: 1. No acute intracranial abnormality. 2. Chronic left cerebellar infarct and minimal chronic small vessel ischemic disease in the cerebral white matter. Electronically Signed   By: Logan Bores M.D.   On: 12/09/2018 14:17   Dg Chest Portable 1 View  Result Date: 12/09/2018 CLINICAL DATA:  Unwitnessed fall.  Probable seizure EXAM: PORTABLE CHEST 1 VIEW COMPARISON:  Dec 04, 2018 FINDINGS: A portion of the lateral most aspect of the left base is not visualized. Visualized lungs show no evident edema or consolidation. There is stable cardiomegaly with pulmonary vascularity normal. No adenopathy. There is aortic atherosclerosis. No evident pneumothorax. Bony structures which are visualized appear unremarkable. IMPRESSION: No edema or consolidation. Note that a portion of the lateral left base is not visualized. Stable cardiomegaly. No pneumothorax. Aortic Atherosclerosis (ICD10-I70.0). Electronically Signed   By: Lowella Grip III M.D.   On: 12/09/2018 08:26    ROS: As per HPI otherwise negative.  Physical Exam: Vitals:   12/10/18 0416 12/10/18 0424 12/10/18 0618 12/10/18 0831  BP:  (!) 91/52  (!) 102/52  Pulse:    (!) 52  Resp:    (!) 27  Temp: (!) 97.5 F (36.4 C) 98 F (36.7 C)  98 F (36.7 C)  TempSrc: Tympanic Oral  Oral  SpO2:   97% 95%     General: obese WDWN NAD awake Head: NCAT sclera not icteric MMM Neck: Supple.  Lungs: DIm BS. Breathing is unlabored. Heart: irreg Abdomen: obese soft NT + BS Skin: areas of breakdown on buttock not examined  Lower extremities: L > R LE edema scabbed areas  left posterior calf, LLE mild erythema. Neuro: confused at times,  Dialysis Access:keft upper AVF + bruit  Dialysis Orders: MWF AF 4 h4 EDW 125.5 425/800 var Na profile 2 2 K 2.25 Ca bath left upper AVF heparin 6000 Mircera 150 5/13 Hectorol 4  Recent labs: hgb 10.5 14% sat ferritin 66 Ca/P ok   Assessment/Plan: 1. Encephalopathy - etiology unclear though clearer than yesterday per notes; when seen at dialysis 5/20 was confused; being treated as possible herpes encephalitis with IV acyclovir without LP due to ^ INR as per HPI  COVID neg. EEG nonspecific Neuro following 2. ESRD -  MWF -HD today K 4.6 - change to 2 Ca bath at d/c - profile 2 var NA AVF heparin 6000 Hectorol 4 Mircera 150 last 5/13  3. Hypotension/volume  - on midodrine 5 bid  Per outpatient records  may need more -BP drop into 60 - 70s during HD treatments- add 10 pre HD specifically- may contribute to bradycardia though 4. Anemia  -hgb 9.4  review of outpatient labs show chronic low tsat despite IV Fe- on chronic coumadin - tsat  14% sat ferritin 66 - replete Fe check hemocult - continue ESA - due 5/27 5. Metabolic bone disease -  Tells me he is on renvela 2 ac (outpt med list says 3)  and Aurixya 2 ac- no sensipar/continue Hectorol 6. Nutrition - renal diet/vits 7. Afib - INR 3.5  8. Frequent falls - per HD note 5/13 had been getting home PT-  9. Disp - needs at least short term  placement for rehab  Myriam Jacobson, PA-C [place] [phone] 12/10/2018, 10:56 AM   Pt seen, examined and agree w A/P as above.  Kelly Splinter  MD 12/10/2018, 2:28 PM

## 2018-12-10 NOTE — Progress Notes (Signed)
ANTICOAGULATION CONSULT NOTE - Initial Consult  Pharmacy Consult for warfarin Indication: atrial fibrillation  Allergies  Allergen Reactions  . Ace Inhibitors Other (See Comments)    Worsening renal insufficiency    Patient Measurements:    Vital Signs: Temp: 98.1 F (36.7 C) (05/25 1550) Temp Source: Oral (05/25 1550) BP: 99/61 (05/25 1550) Pulse Rate: 53 (05/25 1550)  Labs: Recent Labs    12/09/18 0728 12/09/18 1628 12/09/18 2303 12/10/18 0355  HGB 10.2*  --   --  9.4*  HCT 32.5*  --   --  29.6*  PLT 222  --   --  194  LABPROT 32.4*  --   --  34.2*  INR 3.2*  --   --  3.5*  CREATININE 6.97*  --   --  8.20*  TROPONINI 0.08* 0.06* 0.07* 0.07*    Estimated Creatinine Clearance: 10.6 mL/min (A) (by C-G formula based on SCr of 8.2 mg/dL (H)).   Medical History: Past Medical History:  Diagnosis Date  . Acute blood loss anemia   . Atrial fibrillation (HCC)    Chronic  . Chronic combined systolic and diastolic heart failure ([place])   . Colon polyp 2000  . Dysrhythmia    hx  . ESRD on hemodialysis ([place])    [place]  . Essential hypertension   . Gastritis and gastroduodenitis    with bleeding  . Gout   . Heart murmur   . Herpes   . History of blood transfusion   . Nephrolithiasis   . OSA (obstructive sleep apnea) 09/02/2013    IMPRESSION :  1. Mild obstructive sleep apnea with hypopneas causing sleep fragmentation and moderate oxygen desaturation.  2. Short runs of nonsustained VT were noted. His beta blocker may need to be titrated 3. Significant PLM's were noted, the PLM arousal index was low. Please correlate with clinical history of restless leg syndrome.  4. Sleep efficiency was poor.  RECOMMENDATION:  1. Treatment options for this degree of sleep disordered breathing include weight loss and positional therapy to avoid supine sleep 2. Consider titrating beta blocker further, defer to cardiologist 3. Patient should be cautioned against driving  when sleepy.They should be asked to avoid medications with sedative side effects     . Osteoarthritis of right hip   . Pneumonia    hx 30 yrs ago  . Primary osteoarthritis of right hip   . Rheumatoid arthritis ([place])   . Thrombocytopenia ([place])     Assessment: 52 yoM admitted with seizures s/p fall with small hematoma to back of head. Pt on warfarin PTA for hx AFib which was held on admission due to possible need for LP which is not needed anymore from neuro standpoint. Last dose of warfarin was 5/23. INR today remains elevated at 3.5, CBC stable.   *PTA Dose = 5mg  daily  Goal of Therapy:  INR 2-3 Monitor platelets by anticoagulation protocol: Yes   Plan:  -Hold warfarin tonight -INR tomorrow   Arrie Senate, PharmD, BCPS Clinical Pharmacist [phone] Please check AMION for all [place] numbers 12/10/2018

## 2018-12-10 NOTE — Progress Notes (Signed)
Subjective: The patient has no memory of the events that occurred during the time of his admission. He has no complaints this morning.   Objective: Current vital signs: BP (!) 102/52 (BP Location: Right Arm)   Pulse (!) 52   Temp 98 F (36.7 C) (Oral)   Resp (!) 27   SpO2 95%  Vital signs in last 24 hours: Temp:  [97.5 F (36.4 C)-98 F (36.7 C)] 98 F (36.7 C) (05/25 0831) Pulse Rate:  [52-60] 52 (05/25 0831) Resp:  [20-27] 27 (05/25 0831) BP: (91-118)/(35-90) 102/52 (05/25 0831) SpO2:  [92 %-100 %] 95 % (05/25 0831)  Intake/Output from previous day: No intake/output data recorded. Intake/Output this shift: Total I/O In: 120 [P.O.:120] Out: -  Nutritional status:  Diet Order            Diet regular Room service appropriate? Yes; Fluid consistency: Thin  Diet effective now             HEENT: Normocephalic. Lungs: Respirations unlabored Ext: Distal lower extremity edema bilaterally. AV fistula noted.   Neurologic Exam: Ment: Awake and alert. Oriented to city, state, day of week, year and month. Speech fluent. Able to follow all commands but with decreased attention. Tangential.  CN: Fixates and tracks near the midline but has difficulty with visual attention when gazing to right or left. No nystagmus. Visual fields intact. Face symmetric.  Motor: 5/5 BUE. Lifts BLE antigravity but with poor effort.  Sensory: Reacts to tactile stimulation. Cerebellar: No ataxia noted. There is asterixis involving the wrists bilaterally.  Gait: Deferred   Lab Results: Results for orders placed or performed during the hospital encounter of 12/09/18 (from the past 48 hour(s))  Comprehensive metabolic panel     Status: Abnormal   Collection Time: 12/09/18  7:28 AM  Result Value Ref Range   Sodium 138 135 - 145 mmol/L   Potassium 3.9 3.5 - 5.1 mmol/L   Chloride 95 (L) 98 - 111 mmol/L   CO2 25 22 - 32 mmol/L   Glucose, Bld 146 (H) 70 - 99 mg/dL   BUN 40 (H) 8 - 23 mg/dL    Creatinine, Ser 6.97 (H) 0.61 - 1.24 mg/dL   Calcium 10.5 (H) 8.9 - 10.3 mg/dL   Total Protein 8.0 6.5 - 8.1 g/dL   Albumin 3.1 (L) 3.5 - 5.0 g/dL   AST 25 15 - 41 U/L   ALT 15 0 - 44 U/L   Alkaline Phosphatase 282 (H) 38 - 126 U/L   Total Bilirubin 0.6 0.3 - 1.2 mg/dL   GFR calc non Af Amer 7 (L) >60 mL/min   GFR calc Af Amer 8 (L) >60 mL/min   Anion gap 18 (H) 5 - 15    Comment: Performed at [facility], [address], [place], [place] [address]  Troponin I - Once     Status: Abnormal   Collection Time: 12/09/18  7:28 AM  Result Value Ref Range   Troponin I 0.08 (HH) <0.03 ng/mL    Comment: CRITICAL RESULT CALLED TO, READ BACK BY AND VERIFIED WITHBubba Hales AT 2703 12/09/2018 BY L BENFIELD Performed at [facility], [place] [address], [place], [place] [address]   CBC with Differential     Status: Abnormal   Collection Time: 12/09/18  7:28 AM  Result Value Ref Range   WBC 8.8 4.0 - 10.5 K/uL   RBC 3.35 (L) 4.22 - 5.81 MIL/uL   Hemoglobin 10.2 (L) 13.0 -  17.0 g/dL   HCT 32.5 (L) 39.0 - 52.0 %   MCV 97.0 80.0 - 100.0 fL   MCH 30.4 26.0 - 34.0 pg   MCHC 31.4 30.0 - 36.0 g/dL   RDW 18.4 (H) 11.5 - 15.5 %   Platelets 222 150 - 400 K/uL   nRBC 0.0 0.0 - 0.2 %   Neutrophils Relative % 64 %   Neutro Abs 5.8 1.7 - 7.7 K/uL   Lymphocytes Relative 17 %   Lymphs Abs 1.5 0.7 - 4.0 K/uL   Monocytes Relative 13 %   Monocytes Absolute 1.1 (H) 0.1 - 1.0 K/uL   Eosinophils Relative 4 %   Eosinophils Absolute 0.4 0.0 - 0.5 K/uL   Basophils Relative 1 %   Basophils Absolute 0.1 0.0 - 0.1 K/uL   Immature Granulocytes 1 %   Abs Immature Granulocytes 0.04 0.00 - 0.07 K/uL    Comment: Performed at [place] [address], [place], [place] [address]  Protime-INR     Status: Abnormal   Collection Time: 12/09/18  7:28 AM  Result Value Ref Range   Prothrombin Time 32.4 (H) 11.4 - 15.2 seconds   INR 3.2 (H) 0.8 - 1.2    Comment: (NOTE) INR goal varies based on device  and disease states. Performed at [facility], [place] [address] [place], [place] [address]   Magnesium     Status: Abnormal   Collection Time: 12/09/18  7:28 AM  Result Value Ref Range   Magnesium 2.8 (H) 1.7 - 2.4 mg/dL    Comment: Performed at [facility] [address] [place], [place] [address]  CBG monitoring, ED     Status: Abnormal   Collection Time: 12/09/18  7:57 AM  Result Value Ref Range   Glucose-Capillary 124 (H) 70 - 99 mg/dL  SARS Coronavirus 2 (CEPHEID - Performed in [facility] hospital lab), Hosp Order     Status: None   Collection Time: 12/09/18  8:12 AM  Result Value Ref Range   SARS Coronavirus 2 NEGATIVE NEGATIVE    Comment: (NOTE) If result is NEGATIVE SARS-CoV-2 target nucleic acids are NOT DETECTED. The SARS-CoV-2 RNA is generally detectable in upper and lower  respiratory specimens during the acute phase of infection. The lowest  concentration of SARS-CoV-2 viral copies this assay can detect is 250  copies / mL. A negative result does not preclude SARS-CoV-2 infection  and should not be used as the sole basis for treatment or other  patient management decisions.  A negative result may occur with  improper specimen collection / handling, submission of specimen other  than nasopharyngeal swab, presence of viral mutation(s) within the  areas targeted by this assay, and inadequate number of viral copies  (<250 copies / mL). A negative result must be combined with clinical  observations, patient history, and epidemiological information. If result is POSITIVE SARS-CoV-2 target nucleic acids are DETECTED. The SARS-CoV-2 RNA is generally detectable in upper and lower  respiratory specimens dur ing the acute phase of infection.  Positive  results are indicative of active infection with SARS-CoV-2.  Clinical  correlation with patient history and other diagnostic information is  necessary to determine patient infection status.  Positive results do   not rule out bacterial infection or co-infection with other viruses. If result is PRESUMPTIVE POSTIVE SARS-CoV-2 nucleic acids MAY BE PRESENT.   A presumptive positive result was obtained on the submitted specimen  and confirmed on repeat testing.  While 2019 novel coronavirus  (SARS-CoV-2) nucleic acids may be present in the submitted sample  additional confirmatory testing may be necessary for epidemiological  and / or clinical management purposes  to differentiate between  SARS-CoV-2 and other Sarbecovirus currently known to infect humans.  If clinically indicated additional testing with an alternate test  methodology [phone]) is advised. The SARS-CoV-2 RNA is generally  detectable in upper and lower respiratory sp ecimens during the acute  phase of infection. The expected result is Negative. Fact Sheet for Patients:  StrictlyIdeas.no Fact Sheet for Healthcare Providers: BankingDealers.co.za This test is not yet approved or cleared by the [place] FDA and has been authorized for detection and/or diagnosis of SARS-CoV-2 by FDA under an Emergency Use Authorization (EUA).  This EUA will remain in effect (meaning this test can be used) for the duration of the COVID-19 declaration under Section 564(b)(1) of the Act, 21 U.S.C. section 360bbb-3(b)(1), unless the authorization is terminated or revoked sooner. Performed at [facility], [place] [address], [place], [place] [address]   Troponin I - Now Then Q6H     Status: Abnormal   Collection Time: 12/09/18  4:28 PM  Result Value Ref Range   Troponin I 0.06 (HH) <0.03 ng/mL    Comment: CRITICAL VALUE NOTED.  VALUE IS CONSISTENT WITH PREVIOUSLY REPORTED AND CALLED VALUE. Performed at [facility], [place] [address], [place], [place] [address]   Ammonia     Status: Abnormal   Collection Time: 12/09/18  4:28 PM  Result Value Ref Range   Ammonia 45 (H) 9 - 35 umol/L    Comment:  Performed at [facility], [place] [address] [place], [place] [address]  Troponin I -     Status: Abnormal   Collection Time: 12/09/18 11:03 PM  Result Value Ref Range   Troponin I 0.07 (HH) <0.03 ng/mL    Comment: CRITICAL VALUE NOTED.  VALUE IS CONSISTENT WITH PREVIOUSLY REPORTED AND CALLED VALUE. Performed at [facility], [place] [address] [place], [place] [address]   Troponin I - Now Then Q6H     Status: Abnormal   Collection Time: 12/10/18  3:55 AM  Result Value Ref Range   Troponin I 0.07 (HH) <0.03 ng/mL    Comment: CRITICAL VALUE NOTED.  VALUE IS CONSISTENT WITH PREVIOUSLY REPORTED AND CALLED VALUE. Performed at [facility], [place] [address], [place], [place] [address]   Comprehensive metabolic panel     Status: Abnormal   Collection Time: 12/10/18  3:55 AM  Result Value Ref Range   Sodium 138 135 - 145 mmol/L   Potassium 4.6 3.5 - 5.1 mmol/L   Chloride 97 (L) 98 - 111 mmol/L   CO2 26 22 - 32 mmol/L   Glucose, Bld 108 (H) 70 - 99 mg/dL   BUN 49 (H) 8 - 23 mg/dL   Creatinine, Ser 8.20 (H) 0.61 - 1.24 mg/dL   Calcium 9.8 8.9 - 10.3 mg/dL   Total Protein 7.0 6.5 - 8.1 g/dL   Albumin 2.8 (L) 3.5 - 5.0 g/dL   AST 18 15 - 41 U/L   ALT 15 0 - 44 U/L   Alkaline Phosphatase 265 (H) 38 - 126 U/L   Total Bilirubin 0.2 (L) 0.3 - 1.2 mg/dL   GFR calc non Af Amer 6 (L) >60 mL/min   GFR calc Af Amer 7 (L) >60 mL/min   Anion gap 15 5 - 15    Comment:  Performed at [facility], [place] [address], [place], [place] [address]  CBC     Status: Abnormal   Collection Time: 12/10/18  3:55 AM  Result Value Ref Range   WBC 7.6 4.0 - 10.5 K/uL   RBC 3.07 (L) 4.22 - 5.81 MIL/uL   Hemoglobin 9.4 (L) 13.0 - 17.0 g/dL   HCT 29.6 (L) 39.0 - 52.0 %   MCV 96.4 80.0 - 100.0 fL   MCH 30.6 26.0 - 34.0 pg   MCHC 31.8 30.0 - 36.0 g/dL   RDW 18.6 (H) 11.5 - 15.5 %   Platelets 194 150 - 400 K/uL   nRBC 0.0 0.0 - 0.2 %    Comment: Performed at [facility], [place] [address],  [place], [place] [address]  Protime-INR     Status: Abnormal   Collection Time: 12/10/18  3:55 AM  Result Value Ref Range   Prothrombin Time 34.2 (H) 11.4 - 15.2 seconds   INR 3.5 (H) 0.8 - 1.2    Comment: (NOTE) INR goal varies based on device and disease states. Performed at [facility], [place] [address], [place], [place] [address]     Recent Results (from the past 240 hour(s))  SARS Coronavirus 2 (CEPHEID - Performed in [facility] hospital lab), Hosp Order     Status: None   Collection Time: 12/09/18  8:12 AM  Result Value Ref Range Status   SARS Coronavirus 2 NEGATIVE NEGATIVE Final    Comment: (NOTE) If result is NEGATIVE SARS-CoV-2 target nucleic acids are NOT DETECTED. The SARS-CoV-2 RNA is generally detectable in upper and lower  respiratory specimens during the acute phase of infection. The lowest  concentration of SARS-CoV-2 viral copies this assay can detect is 250  copies / mL. A negative result does not preclude SARS-CoV-2 infection  and should not be used as the sole basis for treatment or other  patient management decisions.  A negative result may occur with  improper specimen collection / handling, submission of specimen other  than nasopharyngeal swab, presence of viral mutation(s) within the  areas targeted by this assay, and inadequate number of viral copies  (<250 copies / mL). A negative result must be combined with clinical  observations, patient history, and epidemiological information. If result is POSITIVE SARS-CoV-2 target nucleic acids are DETECTED. The SARS-CoV-2 RNA is generally detectable in upper and lower  respiratory specimens dur ing the acute phase of infection.  Positive  results are indicative of active infection with SARS-CoV-2.  Clinical  correlation with patient history and other diagnostic information is  necessary to determine patient infection status.  Positive results do  not rule out bacterial infection or co-infection with other  viruses. If result is PRESUMPTIVE POSTIVE SARS-CoV-2 nucleic acids MAY BE PRESENT.   A presumptive positive result was obtained on the submitted specimen  and confirmed on repeat testing.  While 2019 novel coronavirus  (SARS-CoV-2) nucleic acids may be present in the submitted sample  additional confirmatory testing may be necessary for epidemiological  and / or clinical management purposes  to differentiate between  SARS-CoV-2 and other Sarbecovirus currently known to infect humans.  If clinically indicated additional testing with an alternate test  methodology [phone]) is advised. The SARS-CoV-2 RNA is generally  detectable in upper and lower respiratory sp ecimens during the acute  phase of infection. The expected result is Negative. Fact Sheet for Patients:  StrictlyIdeas.no Fact Sheet for Healthcare Providers: BankingDealers.co.za This test is  not yet approved or cleared by the [place] and has been authorized for detection and/or diagnosis of SARS-CoV-2 by FDA under an Emergency Use Authorization (EUA).  This EUA will remain in effect (meaning this test can be used) for the duration of the COVID-19 declaration under Section 564(b)(1) of the Act, 21 U.S.C. section 360bbb-3(b)(1), unless the authorization is terminated or revoked sooner. Performed at [facility], [place] [address], [place], [place] [address]     Lipid Panel No results for input(s): CHOL, TRIG, HDL, CHOLHDL, VLDL, LDLCALC in the last 72 hours.  Studies/Results: Ct Head Wo Contrast  Result Date: 12/09/2018 CLINICAL DATA:  Seizure nontraumatic EXAM: CT HEAD WITHOUT CONTRAST CT CERVICAL SPINE WITHOUT CONTRAST TECHNIQUE: Multidetector CT imaging of the head and cervical spine was performed following the standard protocol without intravenous contrast. Multiplanar CT image reconstructions of the cervical spine were also generated. COMPARISON:  CT head  12/04/2018 FINDINGS: CT HEAD FINDINGS Brain: Mild atrophy. Mild chronic ischemic change in the white matter. Chronic infarct left cerebellum unchanged. Negative for acute infarct, hemorrhage, or mass. Vascular: Negative for hyperdense vessel Skull: Negative for skull fracture. Sinuses/Orbits: Mild mucosal edema paranasal sinuses. Negative orbit. Other: None CT CERVICAL SPINE FINDINGS Alignment: Normal Skull base and vertebrae: Negative for fracture Soft tissues and spinal canal: Extensive atherosclerotic calcification Large right submandibular calculus measuring 11 x 17 mm. No soft tissue mass. Disc levels: Disc and facet degeneration throughout the cervical spine causing spinal and foraminal stenosis at multiple levels. Upper chest: Negative Other: None IMPRESSION: 1. No acute intracranial abnormality. Chronic microvascular ischemic change in the white matter. Chronic infarct left cerebellum 2. Negative for cervical spine fracture 3. Large right submandibular calculus 11 x 17 mm. 4. Atherosclerotic disease. Electronically Signed   By: Franchot Gallo M.D.   On: 12/09/2018 09:39   Ct Cervical Spine Wo Contrast  Result Date: 12/09/2018 CLINICAL DATA:  Seizure nontraumatic EXAM: CT HEAD WITHOUT CONTRAST CT CERVICAL SPINE WITHOUT CONTRAST TECHNIQUE: Multidetector CT imaging of the head and cervical spine was performed following the standard protocol without intravenous contrast. Multiplanar CT image reconstructions of the cervical spine were also generated. COMPARISON:  CT head 12/04/2018 FINDINGS: CT HEAD FINDINGS Brain: Mild atrophy. Mild chronic ischemic change in the white matter. Chronic infarct left cerebellum unchanged. Negative for acute infarct, hemorrhage, or mass. Vascular: Negative for hyperdense vessel Skull: Negative for skull fracture. Sinuses/Orbits: Mild mucosal edema paranasal sinuses. Negative orbit. Other: None CT CERVICAL SPINE FINDINGS Alignment: Normal Skull base and vertebrae: Negative for  fracture Soft tissues and spinal canal: Extensive atherosclerotic calcification Large right submandibular calculus measuring 11 x 17 mm. No soft tissue mass. Disc levels: Disc and facet degeneration throughout the cervical spine causing spinal and foraminal stenosis at multiple levels. Upper chest: Negative Other: None IMPRESSION: 1. No acute intracranial abnormality. Chronic microvascular ischemic change in the white matter. Chronic infarct left cerebellum 2. Negative for cervical spine fracture 3. Large right submandibular calculus 11 x 17 mm. 4. Atherosclerotic disease. Electronically Signed   By: Franchot Gallo M.D.   On: 12/09/2018 09:39   Ct Pelvis W Contrast  Result Date: 12/09/2018 CLINICAL DATA:  Pain following fall EXAM: CT PELVIS WITH CONTRAST TECHNIQUE: Multidetector CT imaging of the pelvis was performed using the standard protocol following the bolus administration of intravenous contrast. CONTRAST:  163mL OMNIPAQUE IOHEXOL 300 MG/ML  SOLN COMPARISON:  Pelvis radiographs Dec 04, 2018; CT abdomen and pelvis July 05, 2018 FINDINGS: Urinary Tract: There is  extensive pelvic susceptibility artifact due to bilateral hip prostheses. Urinary bladder appears empty. There are several renal cysts noted bilaterally, largest measuring approximately 3 x 2 cm. No visualized hydronephrosis a period no ureteral calculi evident. No calculi in either visualized kidney. Bowel: Visualized bowel appears unremarkable without demonstrable obstruction or bowel wall thickening. Vascular/Lymphatic: There is distal aortic and iliac artery atherosclerosis. There is also calcification in each common femoral artery. No aneurysm evident. No lower abdominal or pelvic adenopathy evident. Reproductive: There are prostatic calculi. Prostate and seminal vesicles appear normal in size and contour. Other: There is no abscess or ascites in the lower abdomen or pelvis. Appendix appears unremarkable. There is mild soft tissue edema in  the posterolateral abdominal wall on either side. No soft tissue air or abscess noted in the lower abdominal or pelvic wall regions. Musculoskeletal: There are total hip replacements bilaterally with prosthetic components well-seated. Bones appear osteoporotic. No fracture or dislocation is evident. There is extensive disc space narrowing in the visualized lower lumbar spine. IMPRESSION: 1. Status post total hip replacements bilaterally with prosthetic components well-seated. No acute fracture or dislocation is demonstrable on this study. Bones appear osteoporotic. There is advanced arthropathy in the visualized lower lumbar spine. 2. Mild soft tissue edema in the posterolateral gluteal regions. No abscess or air noted in these areas. No fluid collections noted in the lower abdominal or pelvic wall regions. 3. Several renal cysts noted. No hydronephrosis. No ureteral calculus. Urinary bladder appears empty. There are prostatic calculi. 4.  There is aortic and iliac artery atherosclerosis. 4. Visualized bowel appears unremarkable. No abscess in the lower abdomen or pelvis. Appendix appears unremarkable. Electronically Signed   By: Lowella Grip III M.D.   On: 12/09/2018 09:53   Mr Brain Wo Contrast  Result Date: 12/09/2018 CLINICAL DATA:  Seizures. One-week history of tremors and confusion. Encephalopathy, possibly HSV encephalitis or postictal. EXAM: MRI HEAD WITHOUT CONTRAST TECHNIQUE: Multiplanar, multiecho pulse sequences of the brain and surrounding structures were obtained without intravenous contrast. COMPARISON:  Head CT 12/09/2018 FINDINGS: Brain: The study is mildly motion degraded, including thin section imaging through the temporal lobes. The hippocampi are grossly symmetric in volume without focal signal abnormality identified. There is no evidence of acute infarct, intracranial hemorrhage, mass, midline shift, or extra-axial fluid collection. Scattered cerebral white matter T2 hyperintensities  are nonspecific but compatible with minimal chronic small vessel ischemic disease. A small chronic infarct is again noted laterally in the left cerebellar hemisphere. There is mild generalized cerebral atrophy. Vascular: The distal right vertebral artery is poorly visualized and may be congenitally hypoplastic. Other major intracranial vascular flow voids are preserved. Skull and upper cervical spine: Unremarkable bone marrow signal. Sinuses/Orbits: Unremarkable orbits. Subcentimeter right maxillary sinus mucous retention cyst. Small right and moderate left mastoid effusions. Other: None. IMPRESSION: 1. No acute intracranial abnormality. 2. Chronic left cerebellar infarct and minimal chronic small vessel ischemic disease in the cerebral white matter. Electronically Signed   By: Logan Bores M.D.   On: 12/09/2018 14:17   Dg Chest Portable 1 View  Result Date: 12/09/2018 CLINICAL DATA:  Unwitnessed fall.  Probable seizure EXAM: PORTABLE CHEST 1 VIEW COMPARISON:  Dec 04, 2018 FINDINGS: A portion of the lateral most aspect of the left base is not visualized. Visualized lungs show no evident edema or consolidation. There is stable cardiomegaly with pulmonary vascularity normal. No adenopathy. There is aortic atherosclerosis. No evident pneumothorax. Bony structures which are visualized appear unremarkable. IMPRESSION: No edema or consolidation. Note  that a portion of the lateral left base is not visualized. Stable cardiomegaly. No pneumothorax. Aortic Atherosclerosis (ICD10-I70.0). Electronically Signed   By: Lowella Grip III M.D.   On: 12/09/2018 08:26    Medications:  Scheduled: . atorvastatin  10 mg Oral Daily  . gabapentin  100 mg Oral BID  . levETIRAcetam  500 mg Oral BID  . pantoprazole  40 mg Oral Daily  . sevelamer carbonate  2,400 mg Oral TID WC   Continuous: . acyclovir      EEG report, 5/24:  EEG Abnormalities:  1) Generalized irregular slow activity 2) Slow PDR Clinical  Interpretation: This EEG is consistent with a generalized non-specific cerebral dysfunction (encephalopathy). There was no seizure or seizure predisposition recorded on this study. Please note that lack of epileptiform activity on EEG does not preclude the possibility of epilepsy.    Assessment:   78 y.o.malewith PMH of ESRD (MWF HD; last HD session on Saturday due to upcoming holiday), HTN, CHF, Chronic A-fib on Coumadin, and recent seizure diagnosis started on Keppraon 12/07/2018. Patient was brought to [facility] ED via EMS after patient was found on the floor beside his bed this morning.  Patient was slightly confused and altered on initial consult exam 5/24, with asterixis also noted. Primary neurological DDx includes asterixis secondary to metabolic encephalopathy and postictal state secondary to new onset seizures. 1. CT brain revealed a chronic left cerebellar infarct and mild atrophy. MRI negative for acute signal changes.  2. Was started on empiric acyclovir for possible HSV encephalitis. However, MRI reveals no temporal lobe signal abnormality and EEG is without lateralized abnormality or temporal lobe discharge. Exam also without meningismus and patient has no headache. HSV encephalitis is low on the DDx and LP is not indicated from a risk/benefit standpoint as holding Coumadin for LP would increase the risk of stroke. Empiric acyclovir can be discontinued from a Neurological standpoint. From a Dermatological standpoint, however, acyclovir may need to be continued - will defer to primary team regarding this.  3.  Seizure diagnosis. 1 week history of tremors and confusion.  Not sure if these are true seizures or asterixis. 4. Encephalopathy. Most likely a uremic encephalopathy given increasing BUN/Cr and asterixis. Postictal state also possible. EEG not consistent with ongoing seizures. HSV encephalitis is unlikely.  5. Patient with history of genital herpes and some buttock lesions concerning for ?HSV  per chart review.   Recommendations: 1. Continue Keppra for now, but would decrease dosage to 500 mg qd, with 250 mg supplemental dosing after hemodialysis sessions (change has been ordered through Pharmacy). Of note, he was started on Keppra 5/22 due to presumed seizure diagnosis in the setting of confusion and abnormal movements that upon further clinical review may have been secondary to asterixis.  2. Received a dose of fosphenytoin on the day of admission (Sunday). Hold off on further dosing of Dilantin given no electrographic seizure or interictal discharge on EEG.    3. The patient most likely will improve Neurologically with changes in dialysis parameters towards a goal of improved BUN/Cr.  4. When INR is < 3 would restart Coumadin with pharmacy assistance, unless there is a contraindication from a general medical standpoint. No indication for LP at this time.  5. Check reversible causes of encephalopathy with B12, RPR, TSH, B1 level 6. Empiric acyclovir can be discontinued from a Neurological standpoint. From a Dermatological standpoint, however, acyclovir may need to be continued - will defer to primary team regarding this.  LOS: 0 days   @Electronically  signed: Dr. Kerney Elbe 12/10/2018  10:20 AM

## 2018-12-10 NOTE — Progress Notes (Signed)
PT Cancellation Note  Patient Details Name: Bruce Cole MRN: [id number] DOB: September 26, 1940   Cancelled Treatment:    Reason Eval/Treat Not Completed: Patient declined, no reason specified Pt somewhat agitated and declining ambulation, stating "I just want to lay here and die." Pt also confused and when asked if he knew where he was, he stated "the place where you come to die." Educated that he was in the hospital and was in the place he came to get medical treatment. Told pt that PT would check on him at next available date, and pt making comment as if we did not offer to get pt up to ambulate this session. Asked again if pt would like to get OOB and ambulate, and pt continuing to decline. RN present in room. Will reattempt as schedule allows.   Leighton Ruff, PT, DPT  Acute Rehabilitation Services  Pager: [phone] Office: [phone]  Rudean Hitt 12/10/2018, 5:28 PM

## 2018-12-10 NOTE — Progress Notes (Signed)
PROGRESS NOTE    Bruce CERULLO  WNU:[id number] DOB: [birth year] DOA: 12/09/2018 PCP: Debbrah Alar, NP    Brief Narrative: 78 year old with past medical history significant for rheumatoid arthritis, OSA, nephrolithiasis, hypertension, end-stage renal disease on hemodialysis Tuesday Thursday and Saturday, A. fib on Coumadin and recent diagnosis with seizure, chronic combined CHF who presents with breakthrough seizures, confusion and acute encephalopathy. On presentation patient was very somnolent and confused.  Within minutes he became more alert and conversant with admitted provider.  He reports that he has been having some generalized tremors.  He also was recently diagnosed with herpes in his left buttock with intermittent use of Valtrex.  Recent ED he said he was noted to have right arm twitching and then he was confused, Dr. Rory Percy was consulted by telephone and patient was a started on Keppra and recommend follow-up with outpatient neurology.  Patient returned to the ED on 5/20 after developing a slurry speech and altered mental status during hemodialysis.  This was thought to be related to hypotension during dialysis.  Admission was recommended at that time but patient decided to go home.  On 5/21 he was seen in the ED for epistaxis.  Evaluation in the ED during this admission: CT head unremarkable, neurology was consulted and recommended fosphenytoin. .   Assessment & Plan:   Principal Problem:   Seizure ([place]) Active Problems:   Essential hypertension   ATRIAL FIBRILLATION   DM (diabetes mellitus), type 2 with renal complications (HCC)   Chronic combined systolic and diastolic heart failure (HCC)   OSA (obstructive sleep apnea)   ESRD on dialysis ([place])   Herpes  1-Seizure;  Recent diagnosis of seizure started on Keppra on 12/07/2018. Seizure-like activity (HCC) -Differential diagnosis: Asterixis secondary  metabolic encephalopathy vs Postictal starte secondary to seizure.   -EEG; consistent with generalized nonspecific cerebral dysfunction, encephalopathy.  There was no seizure or seizure predisposition recorder on the study. -CT head: No acute intracranial abnormality.  Chronic microvascular ischemic changes in the white matter. -MRI brain: No acute intracranial abnormality.  Chronic left cerebellar infarct and minimal chronic small vessel ischemic disease in the cerebral white matter. -Per neurology low concern for herpes simplex encephalitis, MRI negative, EEG negative.  Need for acyclovir for encephalitis.  No need for LP. No need for for the Dilantin per neurology. Check B12, RPR, TSH, B12 level. He is recommending to continue with Keppra. Patient presentation might be related to asterixis related to uremia versus seizures.  Herpes zoster; Buttocks; Dermatomal ulcerations in various stages of healing along left buttocks, more c/w zoster than with genital HSV recurrence On IV Acyclovir. Change to oral.   A fib;  Will resume Coumadin no need for LP per neurology.  Chronic combine CHF;  Volume management with HD.   HTN;  No on medication at home.   DM; last Hb1c at 6.2.   Incidental finding of large right submandibular calculus: Need to follow-up these as an outpatient.  Lower extremity redness right more than left: Start doxycycline for cellulitis. Estimated body mass index is 31.14 kg/m as calculated from the following:   Height as of 12/06/18: 6\' 3"  (1.905 m).   Weight as of 12/06/18: 113 kg.   DVT prophylaxis: on coumadin prior to admission.,  Code Status: full code Family Communication:will contact family  Disposition Plan: will required SNF placement.    Consultants:   Nephrology  Neurology.   Procedures:    Antimicrobials:   Acyclovir.   doxycycline 5-25  Subjective: Patient was alert.  Confused that time.  He denies chest pain shortness of breath.  He has been coughing he relate for  years  Objective: Vitals:    12/10/18 0424 12/10/18 0618 12/10/18 0831 12/10/18 1155  BP: (!) 91/52  (!) 102/52 (!) 117/51  Pulse:   (!) 52 (!) 56  Resp:   (!) 27 19  Temp: 98 F (36.7 C)  98 F (36.7 C) (!) 97.3 F (36.3 C)  TempSrc: Oral  Oral Oral  SpO2:  97% 95% 95%    Intake/Output Summary (Last 24 hours) at 12/10/2018 1530 Last data filed at 12/10/2018 0846 Gross per 24 hour  Intake 120 ml  Output --  Net 120 ml   There were no vitals filed for this visit.  Examination:  General exam: Appears calm and comfortable  Respiratory system: Clear to auscultation. Respiratory effort normal. Cardiovascular system: S1 & S2 heard, RRR. No JVD, murmurs, rubs, gallops or clicks. No pedal edema. Gastrointestinal system: Abdomen is nondistended, soft and nontender. No organomegaly or masses felt. Normal bowel sounds heard. Central nervous system: Alert and oriented. No focal neurological deficits. Extremities: Symmetric 5 x 5 power. Skin: No rashes, lesions or ulcers Psychiatry: Judgement and insight appear normal. Mood & affect appropriate.     Data Reviewed: I have personally reviewed following labs and imaging studies  CBC: Recent Labs  Lab 12/04/18 1647 12/05/18 1115 12/06/18 1150 12/09/18 0728 12/10/18 0355  WBC 9.1 8.6 8.1 8.8 7.6  NEUTROABS 6.8 6.5  --  5.8  --   HGB 10.1* 10.4* 9.8* 10.2* 9.4*  HCT 32.9* 32.3* 32.0* 32.5* 29.6*  MCV 98.8 96.7 100.0 97.0 96.4  PLT 293 231 234 222 657   Basic Metabolic Panel: Recent Labs  Lab 12/04/18 1647 12/05/18 1115 12/06/18 1150 12/09/18 0728 12/10/18 0355  NA 135 138 137 138 138  K 3.6 3.3* 3.5 3.9 4.6  CL 96* 97* 97* 95* 97*  CO2 27 28 29 25 26   GLUCOSE 97 125* 144* 146* 108*  BUN 31* 13 30* 40* 49*  CREATININE 7.33* 4.64* 6.24* 6.97* 8.20*  CALCIUM 10.2 9.4 10.2 10.5* 9.8  MG  --   --   --  2.8*  --    GFR: Estimated Creatinine Clearance: 10.6 mL/min (A) (by C-G formula based on SCr of 8.2 mg/dL (H)). Liver Function Tests: Recent Labs   Lab 12/04/18 1647 12/09/18 0728 12/10/18 0355  AST 29 25 18   ALT 15 15 15   ALKPHOS 306* 282* 265*  BILITOT 0.5 0.6 0.2*  PROT 8.2* 8.0 7.0  ALBUMIN 3.2* 3.1* 2.8*   No results for input(s): LIPASE, AMYLASE in the last 168 hours. Recent Labs  Lab 12/09/18 1628  AMMONIA 45*   Coagulation Profile: Recent Labs  Lab 12/04/18 1740 12/05/18 1115 12/09/18 0728 12/10/18 0355  INR 3.7* 4.4* 3.2* 3.5*   Cardiac Enzymes: Recent Labs  Lab 12/05/18 1421 12/09/18 0728 12/09/18 1628 12/09/18 2303 12/10/18 0355  TROPONINI 0.07* 0.08* 0.06* 0.07* 0.07*   BNP (last 3 results) No results for input(s): PROBNP in the last 8760 hours. HbA1C: No results for input(s): HGBA1C in the last 72 hours. CBG: Recent Labs  Lab 12/04/18 1644 12/09/18 0757  GLUCAP 84 124*   Lipid Profile: No results for input(s): CHOL, HDL, LDLCALC, TRIG, CHOLHDL, LDLDIRECT in the last 72 hours. Thyroid Function Tests: No results for input(s): TSH, T4TOTAL, FREET4, T3FREE, THYROIDAB in the last 72 hours. Anemia Panel: No results for input(s): VITAMINB12,  FOLATE, FERRITIN, TIBC, IRON, RETICCTPCT in the last 72 hours. Sepsis Labs: No results for input(s): PROCALCITON, LATICACIDVEN in the last 168 hours.  Recent Results (from the past 240 hour(s))  SARS Coronavirus 2 (CEPHEID - Performed in [facility] lab), Hosp Order     Status: None   Collection Time: 12/09/18  8:12 AM  Result Value Ref Range Status   SARS Coronavirus 2 NEGATIVE NEGATIVE Final    Comment: (NOTE) If result is NEGATIVE SARS-CoV-2 target nucleic acids are NOT DETECTED. The SARS-CoV-2 RNA is generally detectable in upper and lower  respiratory specimens during the acute phase of infection. The lowest  concentration of SARS-CoV-2 viral copies this assay can detect is 250  copies / mL. A negative result does not preclude SARS-CoV-2 infection  and should not be used as the sole basis for treatment or other  patient management  decisions.  A negative result may occur with  improper specimen collection / handling, submission of specimen other  than nasopharyngeal swab, presence of viral mutation(s) within the  areas targeted by this assay, and inadequate number of viral copies  (<250 copies / mL). A negative result must be combined with clinical  observations, patient history, and epidemiological information. If result is POSITIVE SARS-CoV-2 target nucleic acids are DETECTED. The SARS-CoV-2 RNA is generally detectable in upper and lower  respiratory specimens dur ing the acute phase of infection.  Positive  results are indicative of active infection with SARS-CoV-2.  Clinical  correlation with patient history and other diagnostic information is  necessary to determine patient infection status.  Positive results do  not rule out bacterial infection or co-infection with other viruses. If result is PRESUMPTIVE POSTIVE SARS-CoV-2 nucleic acids MAY BE PRESENT.   A presumptive positive result was obtained on the submitted specimen  and confirmed on repeat testing.  While 2019 novel coronavirus  (SARS-CoV-2) nucleic acids may be present in the submitted sample  additional confirmatory testing may be necessary for epidemiological  and / or clinical management purposes  to differentiate between  SARS-CoV-2 and other Sarbecovirus currently known to infect humans.  If clinically indicated additional testing with an alternate test  methodology [phone]) is advised. The SARS-CoV-2 RNA is generally  detectable in upper and lower respiratory sp ecimens during the acute  phase of infection. The expected result is Negative. Fact Sheet for Patients:  StrictlyIdeas.no Fact Sheet for Healthcare Providers: BankingDealers.co.za This test is not yet approved or cleared by the [place] FDA and has been authorized for detection and/or diagnosis of SARS-CoV-2 by FDA under an  Emergency Use Authorization (EUA).  This EUA will remain in effect (meaning this test can be used) for the duration of the COVID-19 declaration under Section 564(b)(1) of the Act, 21 U.S.C. section 360bbb-3(b)(1), unless the authorization is terminated or revoked sooner. Performed at [facility], [place] [address] [place], [place] [address]          Radiology Studies: Ct Head Wo Contrast  Result Date: 12/09/2018 CLINICAL DATA:  Seizure nontraumatic EXAM: CT HEAD WITHOUT CONTRAST CT CERVICAL SPINE WITHOUT CONTRAST TECHNIQUE: Multidetector CT imaging of the head and cervical spine was performed following the standard protocol without intravenous contrast. Multiplanar CT image reconstructions of the cervical spine were also generated. COMPARISON:  CT head 12/04/2018 FINDINGS: CT HEAD FINDINGS Brain: Mild atrophy. Mild chronic ischemic change in the white matter. Chronic infarct left cerebellum unchanged. Negative for acute infarct, hemorrhage, or mass. Vascular: Negative for hyperdense vessel Skull:  Negative for skull fracture. Sinuses/Orbits: Mild mucosal edema paranasal sinuses. Negative orbit. Other: None CT CERVICAL SPINE FINDINGS Alignment: Normal Skull base and vertebrae: Negative for fracture Soft tissues and spinal canal: Extensive atherosclerotic calcification Large right submandibular calculus measuring 11 x 17 mm. No soft tissue mass. Disc levels: Disc and facet degeneration throughout the cervical spine causing spinal and foraminal stenosis at multiple levels. Upper chest: Negative Other: None IMPRESSION: 1. No acute intracranial abnormality. Chronic microvascular ischemic change in the white matter. Chronic infarct left cerebellum 2. Negative for cervical spine fracture 3. Large right submandibular calculus 11 x 17 mm. 4. Atherosclerotic disease. Electronically Signed   By: Franchot Gallo M.D.   On: 12/09/2018 09:39   Ct Cervical Spine Wo Contrast  Result Date: 12/09/2018 CLINICAL  DATA:  Seizure nontraumatic EXAM: CT HEAD WITHOUT CONTRAST CT CERVICAL SPINE WITHOUT CONTRAST TECHNIQUE: Multidetector CT imaging of the head and cervical spine was performed following the standard protocol without intravenous contrast. Multiplanar CT image reconstructions of the cervical spine were also generated. COMPARISON:  CT head 12/04/2018 FINDINGS: CT HEAD FINDINGS Brain: Mild atrophy. Mild chronic ischemic change in the white matter. Chronic infarct left cerebellum unchanged. Negative for acute infarct, hemorrhage, or mass. Vascular: Negative for hyperdense vessel Skull: Negative for skull fracture. Sinuses/Orbits: Mild mucosal edema paranasal sinuses. Negative orbit. Other: None CT CERVICAL SPINE FINDINGS Alignment: Normal Skull base and vertebrae: Negative for fracture Soft tissues and spinal canal: Extensive atherosclerotic calcification Large right submandibular calculus measuring 11 x 17 mm. No soft tissue mass. Disc levels: Disc and facet degeneration throughout the cervical spine causing spinal and foraminal stenosis at multiple levels. Upper chest: Negative Other: None IMPRESSION: 1. No acute intracranial abnormality. Chronic microvascular ischemic change in the white matter. Chronic infarct left cerebellum 2. Negative for cervical spine fracture 3. Large right submandibular calculus 11 x 17 mm. 4. Atherosclerotic disease. Electronically Signed   By: Franchot Gallo M.D.   On: 12/09/2018 09:39   Ct Pelvis W Contrast  Result Date: 12/09/2018 CLINICAL DATA:  Pain following fall EXAM: CT PELVIS WITH CONTRAST TECHNIQUE: Multidetector CT imaging of the pelvis was performed using the standard protocol following the bolus administration of intravenous contrast. CONTRAST:  132mL OMNIPAQUE IOHEXOL 300 MG/ML  SOLN COMPARISON:  Pelvis radiographs Dec 04, 2018; CT abdomen and pelvis July 05, 2018 FINDINGS: Urinary Tract: There is extensive pelvic susceptibility artifact due to bilateral hip prostheses.  Urinary bladder appears empty. There are several renal cysts noted bilaterally, largest measuring approximately 3 x 2 cm. No visualized hydronephrosis a period no ureteral calculi evident. No calculi in either visualized kidney. Bowel: Visualized bowel appears unremarkable without demonstrable obstruction or bowel wall thickening. Vascular/Lymphatic: There is distal aortic and iliac artery atherosclerosis. There is also calcification in each common femoral artery. No aneurysm evident. No lower abdominal or pelvic adenopathy evident. Reproductive: There are prostatic calculi. Prostate and seminal vesicles appear normal in size and contour. Other: There is no abscess or ascites in the lower abdomen or pelvis. Appendix appears unremarkable. There is mild soft tissue edema in the posterolateral abdominal wall on either side. No soft tissue air or abscess noted in the lower abdominal or pelvic wall regions. Musculoskeletal: There are total hip replacements bilaterally with prosthetic components well-seated. Bones appear osteoporotic. No fracture or dislocation is evident. There is extensive disc space narrowing in the visualized lower lumbar spine. IMPRESSION: 1. Status post total hip replacements bilaterally with prosthetic components well-seated. No acute fracture or dislocation is demonstrable  on this study. Bones appear osteoporotic. There is advanced arthropathy in the visualized lower lumbar spine. 2. Mild soft tissue edema in the posterolateral gluteal regions. No abscess or air noted in these areas. No fluid collections noted in the lower abdominal or pelvic wall regions. 3. Several renal cysts noted. No hydronephrosis. No ureteral calculus. Urinary bladder appears empty. There are prostatic calculi. 4.  There is aortic and iliac artery atherosclerosis. 4. Visualized bowel appears unremarkable. No abscess in the lower abdomen or pelvis. Appendix appears unremarkable. Electronically Signed   By: Lowella Grip  III M.D.   On: 12/09/2018 09:53   Mr Brain Wo Contrast  Result Date: 12/09/2018 CLINICAL DATA:  Seizures. One-week history of tremors and confusion. Encephalopathy, possibly HSV encephalitis or postictal. EXAM: MRI HEAD WITHOUT CONTRAST TECHNIQUE: Multiplanar, multiecho pulse sequences of the brain and surrounding structures were obtained without intravenous contrast. COMPARISON:  Head CT 12/09/2018 FINDINGS: Brain: The study is mildly motion degraded, including thin section imaging through the temporal lobes. The hippocampi are grossly symmetric in volume without focal signal abnormality identified. There is no evidence of acute infarct, intracranial hemorrhage, mass, midline shift, or extra-axial fluid collection. Scattered cerebral white matter T2 hyperintensities are nonspecific but compatible with minimal chronic small vessel ischemic disease. A small chronic infarct is again noted laterally in the left cerebellar hemisphere. There is mild generalized cerebral atrophy. Vascular: The distal right vertebral artery is poorly visualized and may be congenitally hypoplastic. Other major intracranial vascular flow voids are preserved. Skull and upper cervical spine: Unremarkable bone marrow signal. Sinuses/Orbits: Unremarkable orbits. Subcentimeter right maxillary sinus mucous retention cyst. Small right and moderate left mastoid effusions. Other: None. IMPRESSION: 1. No acute intracranial abnormality. 2. Chronic left cerebellar infarct and minimal chronic small vessel ischemic disease in the cerebral white matter. Electronically Signed   By: Logan Bores M.D.   On: 12/09/2018 14:17   Dg Chest Portable 1 View  Result Date: 12/09/2018 CLINICAL DATA:  Unwitnessed fall.  Probable seizure EXAM: PORTABLE CHEST 1 VIEW COMPARISON:  Dec 04, 2018 FINDINGS: A portion of the lateral most aspect of the left base is not visualized. Visualized lungs show no evident edema or consolidation. There is stable cardiomegaly with  pulmonary vascularity normal. No adenopathy. There is aortic atherosclerosis. No evident pneumothorax. Bony structures which are visualized appear unremarkable. IMPRESSION: No edema or consolidation. Note that a portion of the lateral left base is not visualized. Stable cardiomegaly. No pneumothorax. Aortic Atherosclerosis (ICD10-I70.0). Electronically Signed   By: Lowella Grip III M.D.   On: 12/09/2018 08:26        Scheduled Meds:  atorvastatin  10 mg Oral Daily   [START ON 12/11/2018] Chlorhexidine Gluconate Cloth  6 each Topical Q0600   [START ON 12/12/2018] darbepoetin (ARANESP) injection - DIALYSIS  150 mcg Intravenous Q Wed-HD   ferric citrate  420 mg Oral TID WC   gabapentin  100 mg Oral BID   [START ON 12/11/2018] levETIRAcetam  250 mg Oral Once per day on Tue Thu Sat   And   [START ON 12/11/2018] levETIRAcetam  500 mg Oral Daily   midodrine  10 mg Oral Q M,W,F-HD   midodrine  5 mg Oral BID WC   multivitamin  1 tablet Oral QHS   pantoprazole  40 mg Oral Daily   sevelamer carbonate  2,400 mg Oral TID WC   Continuous Infusions:  acyclovir     ferric gluconate (FERRLECIT/NULECIT) IV       LOS: 0  days    Time spent: 35 minutes.     Elmarie Shiley, MD [facility] Pager [phone]  If 7PM-7AM, please contact night-coverage [email or web address] Password [email or web address] 12/10/2018, 3:30 PM

## 2018-12-10 NOTE — Plan of Care (Signed)
  Problem: Health Behavior/Discharge Planning: Goal: Ability to manage health-related needs will improve Outcome: Not Progressing   

## 2018-12-10 NOTE — Progress Notes (Signed)
Pharmacy Antibiotic Note  Bruce Cole is a 78 y.o. 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  Pharmacy has been consulted for Acyclovir dosing with concern for HSV encephalitis. Per neuro is unlikely, so pharmacy asked to transition to PO acyclovir for herpes zoster of buttocks. Note pt has hx of ESRD on planning for HD today.  Plan: -Acyclovir 400mg  PO x1 then 200mg  q12h -Will need to give additional dose of 400mg  after each HD session -Will begin dosing tonight with load after HD as pt received dose of IV acyclovir yesterday  Temp (24hrs), Avg:97.8 F (36.6 C), Min:97.3 F (36.3 C), Max:98.1 F (36.7 C)  Recent Labs  Lab 12/04/18 1647 12/05/18 1115 12/06/18 1150 12/09/18 0728 12/10/18 0355  WBC 9.1 8.6 8.1 8.8 7.6  CREATININE 7.33* 4.64* 6.24* 6.97* 8.20*    Estimated Creatinine Clearance: 10.6 mL/min (A) (by C-G formula based on SCr of 8.2 mg/dL (H)).    Allergies  Allergen Reactions  . Ace Inhibitors Other (See Comments)    Worsening renal insufficiency    Antimicrobials this admission: Acyclovir 5/24 >>  Dose adjustments this admission: n/a  Microbiology results: 5/24 COVID >> neg  Thank you for allowing pharmacy to be a part of this patient's care.   Arrie Senate, PharmD, BCPS Clinical Pharmacist Please check AMION for all [facility] Pharmacy numbers 12/10/2018

## 2018-12-10 NOTE — Progress Notes (Signed)
Notify  MD about  Pt troponin level of 0.06 and 0.07 no orders given pt denied  CP nor any s/s of SOB

## 2018-12-11 ENCOUNTER — Other Ambulatory Visit: Payer: Self-pay

## 2018-12-11 ENCOUNTER — Other Ambulatory Visit: Payer: Self-pay | Admitting: Family

## 2018-12-11 LAB — BASIC METABOLIC PANEL
Anion gap: 12 (ref 5–15)
BUN: 22 mg/dL (ref 8–23)
CO2: 27 mmol/L (ref 22–32)
Calcium: 8.9 mg/dL (ref 8.9–10.3)
Chloride: 96 mmol/L — ABNORMAL LOW (ref 98–111)
Creatinine, Ser: 5.17 mg/dL — ABNORMAL HIGH (ref 0.61–1.24)
GFR calc Af Amer: 12 mL/min — ABNORMAL LOW (ref >60)
GFR calc non Af Amer: 10 mL/min — ABNORMAL LOW (ref >60)
Glucose, Bld: 104 mg/dL — ABNORMAL HIGH (ref 70–99)
Potassium: 3.7 mmol/L (ref 3.5–5.1)
Sodium: 135 mmol/L (ref 135–145)

## 2018-12-11 LAB — CBC
HCT: 29.8 % — ABNORMAL LOW (ref 39.0–52.0)
Hemoglobin: 9.7 g/dL — ABNORMAL LOW (ref 13.0–17.0)
MCH: 31.1 pg (ref 26.0–34.0)
MCHC: 32.6 g/dL (ref 30.0–36.0)
MCV: 95.5 fL (ref 80.0–100.0)
Platelets: 190 10*3/uL (ref 150–400)
RBC: 3.12 MIL/uL — ABNORMAL LOW (ref 4.22–5.81)
RDW: 18.6 % — ABNORMAL HIGH (ref 11.5–15.5)
WBC: 8.7 10*3/uL (ref 4.0–10.5)
nRBC: 0 % (ref 0.0–0.2)

## 2018-12-11 LAB — RPR: RPR Ser Ql: NONREACTIVE

## 2018-12-11 LAB — PROTIME-INR
INR: 3.2 — ABNORMAL HIGH (ref 0.8–1.2)
Prothrombin Time: 31.9 seconds — ABNORMAL HIGH (ref 11.4–15.2)

## 2018-12-11 LAB — VITAMIN B12: Vitamin B-12: 1105 pg/mL — ABNORMAL HIGH (ref 180–914)

## 2018-12-11 LAB — TSH: TSH: 1.271 u[IU]/mL (ref 0.350–4.500)

## 2018-12-11 MED ORDER — PENTAFLUOROPROP-TETRAFLUOROETH EX AERO: 1.0000 "application " | INHALATION_SPRAY | CUTANEOUS | Status: DC | PRN

## 2018-12-11 MED ORDER — HEPARIN SODIUM (PORCINE) 1000 UNIT/ML DIALYSIS: 20.0000 [IU]/kg | INTRAMUSCULAR | Status: DC | PRN

## 2018-12-11 MED ORDER — ALTEPLASE 2 MG IJ SOLR: 2.0000 mg | Freq: Once | INTRAMUSCULAR | Status: DC | PRN

## 2018-12-11 MED ORDER — TRAMADOL HCL 50 MG PO TABS
ORAL_TABLET | ORAL | Status: AC
  Filled 2018-12-11: qty 1

## 2018-12-11 MED ORDER — MUPIROCIN CALCIUM 2 % EX CREA
TOPICAL_CREAM | Freq: Every day | CUTANEOUS | Status: DC
  Administered 2018-12-11 – 2018-12-13 (×3): via TOPICAL
  Filled 2018-12-11: qty 15

## 2018-12-11 MED ORDER — SODIUM CHLORIDE 0.9 % IV SOLN: 100.0000 mL | INTRAVENOUS | Status: DC | PRN

## 2018-12-11 MED ORDER — LIDOCAINE-PRILOCAINE 2.5-2.5 % EX CREA: 1.0000 "application " | TOPICAL_CREAM | CUTANEOUS | Status: DC | PRN

## 2018-12-11 MED ORDER — LIDOCAINE HCL (PF) 1 % IJ SOLN: 5.0000 mL | INTRAMUSCULAR | Status: DC | PRN

## 2018-12-11 MED ORDER — DARBEPOETIN ALFA 150 MCG/0.3ML IJ SOSY
PREFILLED_SYRINGE | INTRAMUSCULAR | Status: AC
  Filled 2018-12-11: qty 0.3

## 2018-12-11 MED ORDER — HEPARIN SODIUM (PORCINE) 1000 UNIT/ML DIALYSIS: 1000.0000 [IU] | INTRAMUSCULAR | Status: DC | PRN

## 2018-12-11 MED ORDER — CHLORHEXIDINE GLUCONATE CLOTH 2 % EX PADS: 6.0000 | MEDICATED_PAD | Freq: Every day | CUTANEOUS | Status: DC

## 2018-12-11 NOTE — Progress Notes (Signed)
[facility] Progress Note  Subjective: feeling better, getting up w/ assist to the chair.  No c/o, got HD last night w 4L off.   Vitals:   12/11/18 0500 12/11/18 0520 12/11/18 0615 12/11/18 0823  BP: (!) 102/55 (!) 99/55 (!) 119/100 (!) 106/39  Pulse: (!) 59 60 (!) 54 (!) 57  Resp:  16 18 15   Temp:  98.4 F (36.9 C) 98 F (36.7 C) 98 F (36.7 C)  TempSrc:  Oral Oral Oral  SpO2:  96% 100% 99%    Inpatient medications: . acyclovir  200 mg Oral BID  . atorvastatin  10 mg Oral Daily  . Chlorhexidine Gluconate Cloth  6 each Topical Q0600  . [START ON 12/12/2018] darbepoetin (ARANESP) injection - DIALYSIS  150 mcg Intravenous Q Wed-HD  . doxycycline  100 mg Oral Q12H  . ferric citrate  420 mg Oral TID WC  . gabapentin  100 mg Oral BID  . guaiFENesin  600 mg Oral BID  . levETIRAcetam  250 mg Oral Once per day on Tue Thu Sat   And  . levETIRAcetam  500 mg Oral Daily  . midodrine  10 mg Oral Q M,W,F-HD  . midodrine  5 mg Oral BID WC  . multivitamin  1 tablet Oral QHS  . mupirocin cream   Topical Daily  . pantoprazole  40 mg Oral Daily  . sevelamer carbonate  2,400 mg Oral TID WC   . ferric gluconate (FERRLECIT/NULECIT) IV Stopped (12/11/18 0330)   acetaminophen **OR** acetaminophen, calcium carbonate (dosed in mg elemental calcium), camphor-menthol **AND** hydrOXYzine, docusate sodium, feeding supplement (NEPRO CARB STEADY), LORazepam, ondansetron **OR** ondansetron (ZOFRAN) IV, sorbitol, traMADol, zolpidem    Exam: General: obese WDWN NAD alert and Ox 3 Head: NCAT sclera not icteric MMM Neck: Supple.  Lungs: DIm BS. Breathing is unlabored. Heart: irreg Abdomen: obese soft NT + BS Skin: areas of breakdown on buttock not examined  Lower extremities: L > R LE edema scabbed areas left posterior calf, LLE mild erythema. Neuro: Ox 3, nonfocal today Dialysis Access: L upper AVF + bruit   MWF AF   4h  125.5kg  425/800   P2  2/2.25 bath  LUA AVF  Hep  6000 Mircera 150 5/13  Hectorol 4  Recent labs: hgb 10.5 14% sat ferritin 66 Ca/P ok   Assessment/Plan: 1. Encephalopathy - etiology per neuro felt to be uremia related and not seizures but asterixis, which have now both resolved. Was getting Valtrex at home but a low/ renal dosing 500 qod so would not expect MS changes from this. MS back to normal now. EEG nonspecific. IV acyclovir for possible CNS HSV was dc'd. Now getting low dose acyclovir po.  2. Recent seizure diagnosis: continues on Keppra but dose lowered per neuro for ESRD.   3. ESRD -  MWF. HD yest, 4L off. HD tomorrow, 4h, ^BFR 425. Will check adquacy 4. Hypotension/volume  - on midodrine 5 bid  Per outpatient records  may need more -BP drop into 60 - 70s during HD treatments- add 10 pre HD specifically- may contribute to bradycardia though 5. Anemia  -hgb 9.4  review of outpatient labs show chronic low tsat despite IV Fe- on chronic coumadin - tsat  14% sat ferritin 66 - replete Fe check hemocult - continue ESA - due 5/27 6. Metabolic bone disease - pt states is on renvela 2 ac (outpt med list says 3)  and Aurixya 2 ac- no sensipar/continue Hectorol 7. Nutrition -  renal diet/vits 8. Afib - INR 3.5  9. Frequent falls - per HD note 5/13 had been getting home PT-  10. Disp - needs at least short term placement for rehab    [facility] 12/11/2018, 10:03 AM  Iron/TIBC/Ferritin/ %Sat    Component Value Date/Time   IRON 14 (L) 11/12/2015 0507   TIBC 392 11/12/2015 0507   FERRITIN 18 (L) 11/12/2015 0507   IRONPCTSAT 4 (L) 11/12/2015 0507   Recent Labs  Lab 12/10/18 0355 12/11/18 0722  NA 138  --   K 4.6  --   CL 97*  --   CO2 26  --   GLUCOSE 108*  --   BUN 49*  --   CREATININE 8.20*  --   CALCIUM 9.8  --   ALBUMIN 2.8*  --   INR 3.5* 3.2*   Recent Labs  Lab 12/10/18 0355  AST 18  ALT 15  ALKPHOS 265*  BILITOT 0.2*  PROT 7.0   Recent Labs  Lab 12/10/18 0355  WBC 7.6  HGB 9.4*  HCT  29.6*  PLT 194

## 2018-12-11 NOTE — Consult Note (Signed)
   [facility] CM Inpatient Consult   12/11/2018  Bruce Cole [birth year] [id number]    Received referral from ED transition of care CM Wendi Maya) through [facility] related to 6 ED visits in the past 6 months Patient has 30% extreme high risk score for unplanned readmission and hospitalization; has Medicare/ Next[employer] plan. Noted outreach from [facility] care management community and telephonic coordinators to follow-up HF;[facility] social worker, and [facility] Pharmacist in distant past. Our community based plan of care has focused on disease management and community resource support.  Chart review and MD narrative note dated 12/10/18 show as follows: Patient is a 78 year old male with past medical history significant for rheumatoid arthritis, OSA, nephrolithiasis, hypertension, end-stage renal disease on hemodialysis Tuesday-Thursday and Saturday, A. fib on Coumadin and recent diagnosis with seizure, chronic combined CHF-- who presented with breakthrough seizures, confusion and acute encephalopathy, with recently diagnosed herpes to his left buttock with intermittent use of Valtrex. (Frequent falls, hypotension, anemia)  Therapy note reviewed with recommendation for SNF rehab (skilled nursing facility) at discharge, since patient reports living at home alone and receiving minimum assist as needed.  Patient's primary care provider is Debbrah Alar, NP with [employer] at [facility], listed as providing transition of care follow-up.  Will followforprogress anddisposition.Ifthere are anydisposition changes,and needs for appropriate community follow-up,please referto [facility] care management.  Of note, [facility] Care Management services does not replace or interfere with any services that are arranged by transition of care case management or social work.   For questions and additional information, please call:  Seira Cody A. Trev Boley, BSN, RN-BC [facility] Liaison Cell: [phone]

## 2018-12-11 NOTE — Progress Notes (Signed)
Pt return to unit from HD v/s stable pt in no distress made comfortable in bed.

## 2018-12-11 NOTE — Evaluation (Signed)
Physical Therapy Evaluation Patient Details Name: TAVARIOUS FREEL MRN: [id number] DOB: October 12, 1940 Today's Date: 12/11/2018   History of Present Illness  Mr. Rafuse is a 78 y.o. male with PMH of RA; OSA; nephrolithiasis; HTN; ESRD on TTS HD; afib on Coumadin; and recent diagnosis with seizures. 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   Clinical Impression  Patient required significant assist to transfer today. He required max a to transfer sit to stand but was able to do it 3x. He had previously been up to a chair so he did not transfer to a chair. He required max a to transfer back to the bed. He requested to get to a commode but on his last transfer he required max a +2 from an elevated surface. If he got on a BSC he might of had significant difficulty getting off. Nursing was notified that patient was on a bed pan. He would benefit from rehab at a SNF. Skilled therapy will lcotninue to work with the patient.     Follow Up Recommendations SNF;Supervision/Assistance - 24 hour    Equipment Recommendations       Recommendations for Other Services       Precautions / Restrictions Precautions Precautions: Fall Restrictions Weight Bearing Restrictions: No      Mobility  Bed Mobility Overal bed mobility: Needs Assistance Bed Mobility: Sit to Supine       Sit to supine: Max assist;+2 for physical assistance   General bed mobility comments: Max a to the edge of the bed. Once to the edge of the bed mi guard - min a to remain sitting.   Transfers Overall transfer level: Needs assistance Equipment used: Rolling walker (2 wheeled) Transfers: Sit to/from Stand Sit to Stand: Max assist;+2 physical assistance;+2 safety/equipment         General transfer comment: Max a to stand. Sit to stand 3x. Mod cuing to straighten his leg. Once standing Patient required min a for safety and balance. He was able to stand on all 3 attempts. Cuing to reach back.    Ambulation/Gait Ambulation/Gait assistance: Mod assist Gait Distance (Feet): 1 Feet Assistive device: Rolling walker (2 wheeled)   Gait velocity: decreased Gait velocity interpretation: <1.31 ft/sec, indicative of household ambulator General Gait Details: side steps up the edge of the bed with mod cuing to move legs. able to take a step but then fatigued and had to sit.   Stairs            Wheelchair Mobility    Modified Rankin (Stroke Patients Only)       Balance Overall balance assessment: Needs assistance Sitting-balance support: Bilateral upper extremity supported Sitting balance-Leahy Scale: Good     Standing balance support: Bilateral upper extremity supported Standing balance-Leahy Scale: Zero Standing balance comment: Pt dependent of BUE support for standing.                             Pertinent Vitals/Pain Pain Assessment: Faces Faces Pain Scale: Hurts little more Pain Location: knees Pain Descriptors / Indicators: Discomfort;Guarding;Aching    Home Living Family/patient expects to be discharged to:: Private residence Living Arrangements: Alone Available Help at Discharge: Family;Friend(s) Type of Home: House Home Access: Stairs to enter     Home Layout: Two level;Able to live on main level with bedroom/bathroom Home Equipment: Walker - 2 wheels Additional Comments: pt reports having a shower chair and BSC.    Prior Function Level of Independence:  Needs assistance         Comments: Patient did not give much information to physcial therapy. He reported eeciveing some help per physcial therapy.      Hand Dominance   Dominant Hand: Right    Extremity/Trunk Assessment   Upper Extremity Assessment RUE Deficits / Details: limited ROM LUE Deficits / Details: limited ROM    Lower Extremity Assessment Lower Extremity Assessment: Generalized weakness    Cervical / Trunk Assessment Cervical / Trunk Assessment: Normal   Communication   Communication: No difficulties  Cognition Arousal/Alertness: Awake/alert Behavior During Therapy: Flat affect Overall Cognitive Status: History of cognitive impairments - at baseline                                 General Comments: Pt Ox3, not aware of month.       General Comments General comments (skin integrity, edema, etc.): herpes xoster on left buttcok     Exercises     Assessment/Plan    PT Assessment Patient needs continued PT services  PT Problem List Decreased strength;Decreased range of motion;Decreased activity tolerance;Decreased balance;Decreased mobility;Decreased knowledge of use of DME;Decreased safety awareness;Pain;Decreased coordination       PT Treatment Interventions DME instruction;Gait training;Stair training;Functional mobility training;Therapeutic activities;Therapeutic exercise;Neuromuscular re-education;Patient/family education;Manual techniques    PT Goals (Current goals can be found in the Care Plan section)  Acute Rehab PT Goals Patient Stated Goal: to get stronger PT Goal Formulation: With patient Time For Goal Achievement: 12/18/18 Potential to Achieve Goals: Good    Frequency Min 3X/week   Barriers to discharge        Co-evaluation               AM-PAC PT "6 Clicks" Mobility  Outcome Measure Help needed turning from your back to your side while in a flat bed without using bedrails?: A Lot Help needed moving from lying on your back to sitting on the side of a flat bed without using bedrails?: A Lot Help needed moving to and from a bed to a chair (including a wheelchair)?: Total Help needed standing up from a chair using your arms (e.g., wheelchair or bedside chair)?: Total Help needed to walk in hospital room?: Total Help needed climbing 3-5 steps with a railing? : Total 6 Click Score: 8    End of Session Equipment Utilized During Treatment: Gait belt Activity Tolerance: Patient limited by  fatigue Patient left: in bed;with call bell/phone within reach;with bed alarm set Nurse Communication: Mobility status PT Visit Diagnosis: Unsteadiness on feet (R26.81)    Time: 1093-2355 PT Time Calculation (min) (ACUTE ONLY): 21 min   Charges:   PT Evaluation $PT Eval Moderate Complexity: 1 Mod            Carney Living PT DPT  12/11/2018, 4:17 PM

## 2018-12-11 NOTE — Progress Notes (Signed)
PROGRESS NOTE    Bruce Cole  KGY:[id number] DOB: Jul 16, 1941 DOA: 12/09/2018 PCP: Debbrah Alar, NP    Brief Narrative: 78 year old with past medical history significant for rheumatoid arthritis, OSA, nephrolithiasis, hypertension, end-stage renal disease on hemodialysis Tuesday Thursday and Saturday, A. fib on Coumadin and recent diagnosis with seizure, chronic combined CHF who presents with breakthrough seizures, confusion and acute encephalopathy. On presentation patient was very somnolent and confused.  Within minutes he became more alert and conversant with admitted provider.  He reports that he has been having some generalized tremors.  He also was recently diagnosed with herpes in his left buttock with intermittent use of Valtrex.  Recent ED he said he was noted to have right arm twitching and then he was confused, Dr. Rory Percy was consulted by telephone and patient was a started on Keppra and recommend follow-up with outpatient neurology.  Patient returned to the ED on 5/20 after developing a slurry speech and altered mental status during hemodialysis.  This was thought to be related to hypotension during dialysis.  Admission was recommended at that time but patient decided to go home.  On 5/21 he was seen in the ED for epistaxis.  Evaluation in the ED during this admission: CT head unremarkable, neurology was consulted and recommended fosphenytoin. .   Assessment & Plan:   Principal Problem:   Seizure ([place]) Active Problems:   Essential hypertension   ATRIAL FIBRILLATION   DM (diabetes mellitus), type 2 with renal complications (HCC)   Chronic combined systolic and diastolic heart failure (HCC)   OSA (obstructive sleep apnea)   ESRD on dialysis ([place])   Herpes  1-Seizure;  Recent diagnosis of seizure started on Keppra on 12/07/2018. Seizure-like activity (HCC) -Differential diagnosis: Asterixis secondary  metabolic encephalopathy vs Postictal starte secondary to seizure.   -EEG; consistent with generalized nonspecific cerebral dysfunction, encephalopathy.  There was no seizure or seizure predisposition recorder on the study. -CT head: No acute intracranial abnormality.  Chronic microvascular ischemic changes in the white matter. -MRI brain: No acute intracranial abnormality.  Chronic left cerebellar infarct and minimal chronic small vessel ischemic disease in the cerebral white matter. -Per neurology low concern for herpes simplex encephalitis, MRI negative, EEG negative.  Need for acyclovir for encephalitis.  No need for LP. No need for for the Dilantin per neurology. B12 1,105, RPR; non reactive , TSH 1.2, B1 level pending He is recommending to continue with Keppra. Patient presentation might be related to asterixis related to uremia versus seizures. Continue with HD, Keppra.  Slowly improving.   Herpes zoster; Buttocks; Dermatomal ulcerations in various stages of healing along left buttocks, more c/w zoster than with genital HSV recurrence Oral acyclovir.   A fib;  Will resume Coumadin no need for LP per neurology. Coumadin dose by pharmacy.   Chronic combine CHF;  Volume management with HD.   Hypotension;  On midodrine at home. Continue.  DM; last Hb1c at 6.2.   Incidental finding of large right submandibular calculus: Need to follow-up these as an outpatient.  Lower extremity redness right more than left: Started  doxycycline for cellulitis. Treat for 5 days.    Estimated body mass index is 31.14 kg/m as calculated from the following:   Height as of 12/06/18: 6\' 3"  (1.905 m).   Weight as of 12/06/18: 113 kg.   DVT prophylaxis: on coumadin prior to admission.,  Code Status: full code Family Communication:will contact family  Disposition Plan: will required SNF placement.    Consultants:  Nephrology  Neurology.   Procedures:    Antimicrobials:   Acyclovir.   doxycycline 5-25   Subjective: Patient wake up to voice,  denies pain. He is oriented to place and person. Go back to sleep   Objective: Vitals:   12/11/18 0615 12/11/18 0823 12/11/18 1138 12/11/18 1656  BP: (!) 119/100 (!) 106/39 (!) 90/52 (!) 86/53  Pulse: (!) 54 (!) 57 (!) 57 87  Resp: 18 15 17 20   Temp: 98 F (36.7 C) 98 F (36.7 C) (!) 97.5 F (36.4 C) 97.9 F (36.6 C)  TempSrc: Oral Oral Oral Oral  SpO2: 100% 99% 96% 97%    Intake/Output Summary (Last 24 hours) at 12/11/2018 1754 Last data filed at 12/11/2018 7989 Gross per 24 hour  Intake -  Output 4000 ml  Net -4000 ml   There were no vitals filed for this visit.  Examination:  General exam: NAD, sleepy.  Respiratory system: CTA Cardiovascular system: S 1, S 2 RRR Gastrointestinal system: BS present, soft, nt, nd Central nervous system: lethargic , follows command.  Extremities: symmetric power.  Skin: no rashes.    Data Reviewed: I have personally reviewed following labs and imaging studies  CBC: Recent Labs  Lab 12/05/18 1115 12/06/18 1150 12/09/18 0728 12/10/18 0355 12/11/18 0727  WBC 8.6 8.1 8.8 7.6 8.7  NEUTROABS 6.5  --  5.8  --   --   HGB 10.4* 9.8* 10.2* 9.4* 9.7*  HCT 32.3* 32.0* 32.5* 29.6* 29.8*  MCV 96.7 100.0 97.0 96.4 95.5  PLT 231 234 222 194 211   Basic Metabolic Panel: Recent Labs  Lab 12/05/18 1115 12/06/18 1150 12/09/18 0728 12/10/18 0355 12/11/18 0727  NA 138 137 138 138 135  K 3.3* 3.5 3.9 4.6 3.7  CL 97* 97* 95* 97* 96*  CO2 28 29 25 26 27   GLUCOSE 125* 144* 146* 108* 104*  BUN 13 30* 40* 49* 22  CREATININE 4.64* 6.24* 6.97* 8.20* 5.17*  CALCIUM 9.4 10.2 10.5* 9.8 8.9  MG  --   --  2.8*  --   --    GFR: Estimated Creatinine Clearance: 16.7 mL/min (A) (by C-G formula based on SCr of 5.17 mg/dL (H)). Liver Function Tests: Recent Labs  Lab 12/09/18 0728 12/10/18 0355  AST 25 18  ALT 15 15  ALKPHOS 282* 265*  BILITOT 0.6 0.2*  PROT 8.0 7.0  ALBUMIN 3.1* 2.8*   No results for input(s): LIPASE, AMYLASE in the last  168 hours. Recent Labs  Lab 12/09/18 1628  AMMONIA 45*   Coagulation Profile: Recent Labs  Lab 12/05/18 1115 12/09/18 0728 12/10/18 0355 12/11/18 0722  INR 4.4* 3.2* 3.5* 3.2*   Cardiac Enzymes: Recent Labs  Lab 12/05/18 1421 12/09/18 0728 12/09/18 1628 12/09/18 2303 12/10/18 0355  TROPONINI 0.07* 0.08* 0.06* 0.07* 0.07*   BNP (last 3 results) No results for input(s): PROBNP in the last 8760 hours. HbA1C: No results for input(s): HGBA1C in the last 72 hours. CBG: Recent Labs  Lab 12/09/18 0757  GLUCAP 124*   Lipid Profile: No results for input(s): CHOL, HDL, LDLCALC, TRIG, CHOLHDL, LDLDIRECT in the last 72 hours. Thyroid Function Tests: Recent Labs    12/11/18 0722  TSH 1.271   Anemia Panel: Recent Labs    12/11/18 0722  VITAMINB12 1,105*   Sepsis Labs: No results for input(s): PROCALCITON, LATICACIDVEN in the last 168 hours.  Recent Results (from the past 240 hour(s))  SARS Coronavirus 2 (CEPHEID - Performed in [place] hospital  lab), Hosp Order     Status: None   Collection Time: 12/09/18  8:12 AM  Result Value Ref Range Status   SARS Coronavirus 2 NEGATIVE NEGATIVE Final    Comment: (NOTE) If result is NEGATIVE SARS-CoV-2 target nucleic acids are NOT DETECTED. The SARS-CoV-2 RNA is generally detectable in upper and lower  respiratory specimens during the acute phase of infection. The lowest  concentration of SARS-CoV-2 viral copies this assay can detect is 250  copies / mL. A negative result does not preclude SARS-CoV-2 infection  and should not be used as the sole basis for treatment or other  patient management decisions.  A negative result may occur with  improper specimen collection / handling, submission of specimen other  than nasopharyngeal swab, presence of viral mutation(s) within the  areas targeted by this assay, and inadequate number of viral copies  (<250 copies / mL). A negative result must be combined with clinical   observations, patient history, and epidemiological information. If result is POSITIVE SARS-CoV-2 target nucleic acids are DETECTED. The SARS-CoV-2 RNA is generally detectable in upper and lower  respiratory specimens dur ing the acute phase of infection.  Positive  results are indicative of active infection with SARS-CoV-2.  Clinical  correlation with patient history and other diagnostic information is  necessary to determine patient infection status.  Positive results do  not rule out bacterial infection or co-infection with other viruses. If result is PRESUMPTIVE POSTIVE SARS-CoV-2 nucleic acids MAY BE PRESENT.   A presumptive positive result was obtained on the submitted specimen  and confirmed on repeat testing.  While 2019 novel coronavirus  (SARS-CoV-2) nucleic acids may be present in the submitted sample  additional confirmatory testing may be necessary for epidemiological  and / or clinical management purposes  to differentiate between  SARS-CoV-2 and other Sarbecovirus currently known to infect humans.  If clinically indicated additional testing with an alternate test  methodology [phone]) is advised. The SARS-CoV-2 RNA is generally  detectable in upper and lower respiratory sp ecimens during the acute  phase of infection. The expected result is Negative. Fact Sheet for Patients:  StrictlyIdeas.no Fact Sheet for Healthcare Providers: BankingDealers.co.za This test is not yet approved or cleared by the [place] FDA and has been authorized for detection and/or diagnosis of SARS-CoV-2 by FDA under an Emergency Use Authorization (EUA).  This EUA will remain in effect (meaning this test can be used) for the duration of the COVID-19 declaration under Section 564(b)(1) of the Act, 21 U.S.C. section 360bbb-3(b)(1), unless the authorization is terminated or revoked sooner. Performed at [facility], [place] [address],  [place], [place] [address]          Radiology Studies: No results found.      Scheduled Meds: . acyclovir  200 mg Oral BID  . atorvastatin  10 mg Oral Daily  . Chlorhexidine Gluconate Cloth  6 each Topical Q0600  . Chlorhexidine Gluconate Cloth  6 each Topical Q0600  . [START ON 12/12/2018] darbepoetin (ARANESP) injection - DIALYSIS  150 mcg Intravenous Q Wed-HD  . doxycycline  100 mg Oral Q12H  . ferric citrate  420 mg Oral TID WC  . gabapentin  100 mg Oral BID  . guaiFENesin  600 mg Oral BID  . levETIRAcetam  250 mg Oral Once per day on Tue Thu Sat   And  . levETIRAcetam  500 mg Oral Daily  . midodrine  10 mg Oral Q M,W,F-HD  . midodrine  5 mg Oral BID WC  . multivitamin  1 tablet Oral QHS  . mupirocin cream   Topical Daily  . pantoprazole  40 mg Oral Daily  . sevelamer carbonate  2,400 mg Oral TID WC   Continuous Infusions: . ferric gluconate (FERRLECIT/NULECIT) IV Stopped (12/11/18 0330)     LOS: 1 day    Time spent: 35 minutes.     Elmarie Shiley, MD [facility] Pager [phone]  If 7PM-7AM, please contact night-coverage [email or web address] Password [facility] 12/11/2018, 5:54 PM

## 2018-12-11 NOTE — Progress Notes (Signed)
ANTICOAGULATION CONSULT NOTE - Initial Consult  Pharmacy Consult for warfarin Indication: atrial fibrillation  Allergies  Allergen Reactions  . Ace Inhibitors Other (See Comments)    Worsening renal insufficiency    Patient Measurements:    Vital Signs: Temp: 97.5 F (36.4 C) (05/26 1138) Temp Source: Oral (05/26 1138) BP: 90/52 (05/26 1138) Pulse Rate: 57 (05/26 1138)  Labs: Recent Labs    12/09/18 0728 12/09/18 1628 12/09/18 2303 12/10/18 0355 12/11/18 0722  HGB 10.2*  --   --  9.4*  --   HCT 32.5*  --   --  29.6*  --   PLT 222  --   --  194  --   LABPROT 32.4*  --   --  34.2* 31.9*  INR 3.2*  --   --  3.5* 3.2*  CREATININE 6.97*  --   --  8.20*  --   TROPONINI 0.08* 0.06* 0.07* 0.07*  --     Estimated Creatinine Clearance: 10.6 mL/min (A) (by C-G formula based on SCr of 8.2 mg/dL (H)).   Medical History: Past Medical History:  Diagnosis Date  . Acute blood loss anemia   . Atrial fibrillation (HCC)    Chronic  . Chronic combined systolic and diastolic heart failure ([place])   . Colon polyp 2000  . Dysrhythmia    hx  . ESRD on hemodialysis ([place])    [place]  . Essential hypertension   . Gastritis and gastroduodenitis    with bleeding  . Gout   . Heart murmur   . Herpes   . History of blood transfusion   . Nephrolithiasis   . OSA (obstructive sleep apnea) 09/02/2013    IMPRESSION :  1. Mild obstructive sleep apnea with hypopneas causing sleep fragmentation and moderate oxygen desaturation.  2. Short runs of nonsustained VT were noted. His beta blocker may need to be titrated 3. Significant PLM's were noted, the PLM arousal index was low. Please correlate with clinical history of restless leg syndrome.  4. Sleep efficiency was poor.  RECOMMENDATION:  1. Treatment options for this degree of sleep disordered breathing include weight loss and positional therapy to avoid supine sleep 2. Consider titrating beta blocker further, defer to  cardiologist 3. Patient should be cautioned against driving when sleepy.They should be asked to avoid medications with sedative side effects     . Osteoarthritis of right hip   . Pneumonia    hx 30 yrs ago  . Primary osteoarthritis of right hip   . Rheumatoid arthritis ([place])   . Thrombocytopenia ([place])     Assessment: 11 yoM admitted with seizures s/p fall with small hematoma to back of head. Pt on warfarin PTA for hx AFib which was held on admission due to possible need for LP which is not needed anymore from neuro standpoint. Last dose of warfarin was 5/23. INR today remains elevated at 3.2, CBC stable.   *PTA Dose = 5mg  daily  Goal of Therapy:  INR 2-3 Monitor platelets by anticoagulation protocol: Yes   Plan:  -Hold warfarin tonight -INR tomorrow   Maysa Lynn A. Levada Dy, PharmD, [place] Pager: [phone] Please utilize Amion for appropriate phone number to reach the unit pharmacist ([place])   12/11/2018

## 2018-12-11 NOTE — Progress Notes (Signed)
Renal Navigator notified OP HD clinic/SW of patient's admission and negative COVID 19 rapid test result to provide continuity of care and safety.  Alphonzo Cruise, [place] Renal Navigator [phone]

## 2018-12-11 NOTE — Progress Notes (Signed)
Occupational Therapy Evaluation Patient Details Name: Bruce Cole MRN: [id number] DOB: 05/10/1941 Today's Date: 12/11/2018    History of Present Illness Mr. Holsinger is a 78 y.o. male with PMH of RA; OSA; nephrolithiasis; HTN; ESRD on TTS HD; afib on Coumadin; and recent diagnosis with seizures.    Clinical Impression   Pt presented to hospital with description above. PTA pt PLOF was requiring assistance in ADLs and functional mobility due to limited ROM, strength, and cognition. Pt reports living at home alone and receiving min assist as needed. Pt currently requires Max A +2 for functional mobility due to decreased standing tolerance, strength, and pain. Pt will benefit from continued acute OT to prepare for transition skilled rehab. DC to SNF to maximize independence to safely in engage in ADLs.     Follow Up Recommendations  SNF    Equipment Recommendations       Recommendations for Other Services       Precautions / Restrictions Restrictions Weight Bearing Restrictions: No      Mobility Bed Mobility Overal bed mobility: Needs Assistance Bed Mobility: Sit to Supine       Sit to supine: Max assist;+2 for physical assistance;+2 for safety/equipment   General bed mobility comments: Pt requires assist to manage LE and trunk to supine position. Bed pad required to scoot to head of bed,  Transfers Overall transfer level: Needs assistance Equipment used: Rolling walker (2 wheeled) Transfers: Sit to/from Stand Sit to Stand: Max assist;+2 physical assistance;+2 safety/equipment         General transfer comment: Stedi may be beneficial due to limited standing tolerance and height.     Balance Overall balance assessment: Needs assistance Sitting-balance support: Bilateral upper extremity supported Sitting balance-Leahy Scale: Good     Standing balance support: Bilateral upper extremity supported Standing balance-Leahy Scale: Zero Standing balance comment: Pt  dependent of BUE support for standing.                           ADL either performed or assessed with clinical judgement   ADL Overall ADL's : Needs assistance/impaired Eating/Feeding: Minimal assistance Eating/Feeding Details (indicate cue type and reason): Limited grip strength and shd ROM to bring food to mouth and requires set up.  Grooming: Moderate assistance Grooming Details (indicate cue type and reason): Due to limited B ROM Upper Body Bathing: Moderate assistance   Lower Body Bathing: Maximal assistance   Upper Body Dressing : Moderate assistance   Lower Body Dressing: Maximal assistance   Toilet Transfer: Maximal assistance;+2 for physical assistance;+2 for safety/equipment;Cueing for safety;Cueing for sequencing;Ambulation;RW Toilet Transfer Details (indicate cue type and reason): Simulated toilet transfer from chair to bed.         Functional mobility during ADLs: Maximal assistance;+2 for physical assistance;+2 for safety/equipment;Cueing for safety;Cueing for sequencing;Rolling walker General ADL Comments: Chair requires to be built up due to difficulties in sit to stand transition.      Vision         Perception     Praxis      Pertinent Vitals/Pain Pain Assessment: Faces Faces Pain Scale: Hurts little more Pain Location: knees Pain Descriptors / Indicators: Discomfort;Guarding;Aching Pain Intervention(s): Limited activity within patient's tolerance;Monitored during session;Repositioned     Hand Dominance Right   Extremity/Trunk Assessment Upper Extremity Assessment Upper Extremity Assessment: Generalized weakness;RUE deficits/detail;LUE deficits/detail RUE Deficits / Details: limited ROM LUE Deficits / Details: limited ROM   Lower Extremity Assessment Lower Extremity  Assessment: Defer to PT evaluation       Communication Communication Communication: No difficulties   Cognition Arousal/Alertness: Awake/alert Behavior During  Therapy: Flat affect Overall Cognitive Status: History of cognitive impairments - at baseline                                 General Comments: Pt Ox3, not aware of month.    General Comments  Herpes zoster on L glute.    Exercises     Shoulder Instructions      Home Living Family/patient expects to be discharged to:: Private residence Living Arrangements: Alone Available Help at Discharge: Family;Friend(s) Type of Home: House Home Access: Stairs to enter     Home Layout: Two level;Able to live on main level with bedroom/bathroom     Bathroom Shower/Tub: Tub/shower unit         Home Equipment: Walker - 2 wheels   Additional Comments: pt reports having a shower chair and BSC.      Prior Functioning/Environment Level of Independence: Needs assistance        Comments: Pt reports receiving help from son. Reports difficulty with functional transitions.         OT Problem List: Decreased activity tolerance;Impaired balance (sitting and/or standing);Decreased safety awareness;Decreased knowledge of use of DME or AE;Decreased knowledge of precautions      OT Treatment/Interventions: Self-care/ADL training;Therapeutic exercise;DME and/or AE instruction;Therapeutic activities;Patient/family education;Balance training    OT Goals(Current goals can be found in the care plan section) Acute Rehab OT Goals Patient Stated Goal: to get stronger OT Goal Formulation: With patient Time For Goal Achievement: 12/25/18 Potential to Achieve Goals: Fair  OT Frequency: Min 2X/week   Barriers to D/C: Inaccessible home environment;Decreased caregiver support   Due to patients cognitive state and functional limitations but demonstrates to be unsafe to return to home alone.        Co-evaluation              AM-PAC OT "6 Clicks" Daily Activity     Outcome Measure Help from another person eating meals?: A Little Help from another person taking care of personal  grooming?: A Little Help from another person toileting, which includes using toliet, bedpan, or urinal?: A Lot Help from another person bathing (including washing, rinsing, drying)?: A Lot Help from another person to put on and taking off regular upper body clothing?: A Little Help from another person to put on and taking off regular lower body clothing?: A Lot 6 Click Score: 15   End of Session Equipment Utilized During Treatment: Gait belt;Rolling walker Nurse Communication: Mobility status;Need for lift equipment  Activity Tolerance: Patient limited by pain Patient left: in bed;with call bell/phone within reach;with bed alarm set  OT Visit Diagnosis: Unsteadiness on feet (R26.81);History of falling (Z91.81);Pain                Time: 8676-1950 OT Time Calculation (min): 31 min Charges:  OT General Charges $OT Visit: 1 Visit OT Evaluation $OT Eval Moderate Complexity: 1 Mod OT Treatments $Self Care/Home Management : 8-22 mins  Minus Breeding, MSOT, OTR/L  Supplemental Rehabilitation Services  [phone]  Marius Ditch 12/11/2018, 12:35 PM

## 2018-12-11 NOTE — Consult Note (Addendum)
[facility] Nurse wound consult note Reason for Consult: Consult requested for buttocks wounds.  Reviewed photos and progress notes in the EMR.  Pt is noted to have possible herpes and lesions are suspected to be related to HSV.  Patchy areas of scattered dry, brown scabbed lesions. Appearance is not consistent with pressure injuries.  Dressing procedure/placement/frequency: Topical treatment will be minimally effective to promote healing; systemic treatment is recommended.  Orders provided for bedside nurses to apply Bactroban cream daily to promote moist healing and assist with lifting and removing dry scabbed areas. Please re-consult if further assistance is needed.  Thank-you,  Julien Girt MSN, [place], [place], [place], [place]

## 2018-12-11 NOTE — Telephone Encounter (Signed)
Application started for MD INR

## 2018-12-11 NOTE — Progress Notes (Signed)
Pt alert with  occasional confusion  Sent  Off unit to hemodialysis suit. Report given to HD RN

## 2018-12-12 DIAGNOSIS — I5042 Chronic combined systolic (congestive) and diastolic (congestive) heart failure: Secondary | ICD-10-CM

## 2018-12-12 DIAGNOSIS — B009 Herpesviral infection, unspecified: Secondary | ICD-10-CM

## 2018-12-12 DIAGNOSIS — Z992 Dependence on renal dialysis: Secondary | ICD-10-CM

## 2018-12-12 DIAGNOSIS — E1122 Type 2 diabetes mellitus with diabetic chronic kidney disease: Secondary | ICD-10-CM

## 2018-12-12 DIAGNOSIS — I10 Essential (primary) hypertension: Secondary | ICD-10-CM

## 2018-12-12 DIAGNOSIS — N186 End stage renal disease: Secondary | ICD-10-CM

## 2018-12-12 LAB — CBC
HCT: 30 % — ABNORMAL LOW (ref 39.0–52.0)
Hemoglobin: 9.6 g/dL — ABNORMAL LOW (ref 13.0–17.0)
MCH: 31 pg (ref 26.0–34.0)
MCHC: 32 g/dL (ref 30.0–36.0)
MCV: 96.8 fL (ref 80.0–100.0)
Platelets: 186 10*3/uL (ref 150–400)
RBC: 3.1 MIL/uL — ABNORMAL LOW (ref 4.22–5.81)
RDW: 18.9 % — ABNORMAL HIGH (ref 11.5–15.5)
WBC: 8.1 10*3/uL (ref 4.0–10.5)
nRBC: 0.2 % (ref 0.0–0.2)

## 2018-12-12 LAB — RENAL FUNCTION PANEL
Albumin: 2.8 g/dL — ABNORMAL LOW (ref 3.5–5.0)
Anion gap: 13 (ref 5–15)
BUN: 37 mg/dL — ABNORMAL HIGH (ref 8–23)
CO2: 25 mmol/L (ref 22–32)
Calcium: 9.3 mg/dL (ref 8.9–10.3)
Chloride: 93 mmol/L — ABNORMAL LOW (ref 98–111)
Creatinine, Ser: 7.44 mg/dL — ABNORMAL HIGH (ref 0.61–1.24)
GFR calc Af Amer: 8 mL/min — ABNORMAL LOW (ref >60)
GFR calc non Af Amer: 6 mL/min — ABNORMAL LOW (ref >60)
Glucose, Bld: 128 mg/dL — ABNORMAL HIGH (ref 70–99)
Phosphorus: 3.6 mg/dL (ref 2.5–4.6)
Potassium: 4.1 mmol/L (ref 3.5–5.1)
Sodium: 131 mmol/L — ABNORMAL LOW (ref 135–145)

## 2018-12-12 LAB — PROTIME-INR
INR: 3.2 — ABNORMAL HIGH (ref 0.8–1.2)
Prothrombin Time: 31.9 seconds — ABNORMAL HIGH (ref 11.4–15.2)

## 2018-12-12 MED ORDER — DARBEPOETIN ALFA 150 MCG/0.3ML IJ SOSY
PREFILLED_SYRINGE | INTRAMUSCULAR | Status: AC
  Administered 2018-12-12: 150 ug via INTRAVENOUS
  Filled 2018-12-12: qty 0.3

## 2018-12-12 MED ORDER — HEPARIN SODIUM (PORCINE) 1000 UNIT/ML DIALYSIS: 6000.0000 [IU] | Freq: Once | INTRAMUSCULAR | Status: DC

## 2018-12-12 MED ORDER — HEPARIN SODIUM (PORCINE) 1000 UNIT/ML IJ SOLN
INTRAMUSCULAR | Status: AC
  Filled 2018-12-12: qty 6

## 2018-12-12 MED ORDER — BUPIVACAINE 0.5 % ON-Q PUMP SINGLE CATH 400 ML
400.0000 mL | INJECTION | Status: DC
  Filled 2018-12-12: qty 400

## 2018-12-12 NOTE — Progress Notes (Signed)
Physical Therapy Treatment Patient Details Name: Bruce Cole MRN: [id number] DOB: February 16, 1941 Today's Date: 12/12/2018    History of Present Illness Bruce Cole is a 78 y.o. male with PMH of RA; OSA; nephrolithiasis; HTN; ESRD on TTS HD; afib on Coumadin; and recent diagnosis with seizures. 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     PT Comments    Patient was able to ambulate 5' today. He was slow but steady. He required mod a to sit up in the bed which was an improvement. He also required mod a for sit to stand transfer. He is making progress. Therapy will continue to work with the patient.   Follow Up Recommendations  SNF;Supervision/Assistance - 24 hour     Equipment Recommendations       Recommendations for Other Services       Precautions / Restrictions Precautions Precautions: Fall Restrictions Weight Bearing Restrictions: No    Mobility  Bed Mobility Overal bed mobility: Needs Assistance Bed Mobility: Sit to Supine       Sit to supine: Max assist;+2 for physical assistance   General bed mobility comments: Improved ability to move legs to the edge of the bed. Mod a to sit up.   Transfers Overall transfer level: Needs assistance Equipment used: Rolling walker (2 wheeled) Transfers: Sit to/from Stand Sit to Stand: Mod assist;+2 physical assistance         General transfer comment: sit to stand 2x with therapy with mod a for balance   Ambulation/Gait Ambulation/Gait assistance: Mod assist Gait Distance (Feet): 5 Feet Assistive device: Rolling walker (2 wheeled)   Gait velocity: decreased   General Gait Details: short slow steps but steady.. Ambualted with chair follow.    Stairs             Wheelchair Mobility    Modified Rankin (Stroke Patients Only)       Balance Overall balance assessment: Needs assistance Sitting-balance support: Bilateral upper extremity supported Sitting balance-Leahy Scale: Good     Standing  balance support: Bilateral upper extremity supported Standing balance-Leahy Scale: Zero Standing balance comment: Pt dependent of BUE support for standing.                            Cognition Arousal/Alertness: Awake/alert Behavior During Therapy: Flat affect Overall Cognitive Status: History of cognitive impairments - at baseline                                 General Comments: Pt AOx3.       Exercises      General Comments        Pertinent Vitals/Pain Pain Assessment: Faces Faces Pain Scale: Hurts a little bit Pain Location: knees Pain Descriptors / Indicators: Discomfort;Guarding;Aching    Home Living                      Prior Function            PT Goals (current goals can now be found in the care plan section) Acute Rehab PT Goals Patient Stated Goal: to get stronger PT Goal Formulation: With patient Time For Goal Achievement: 12/18/18 Potential to Achieve Goals: Good    Frequency    Min 3X/week      PT Plan Current plan remains appropriate    Co-evaluation  AM-PAC PT "6 Clicks" Mobility   Outcome Measure  Help needed turning from your back to your side while in a flat bed without using bedrails?: A Lot Help needed moving from lying on your back to sitting on the side of a flat bed without using bedrails?: A Lot Help needed moving to and from a bed to a chair (including a wheelchair)?: Total Help needed standing up from a chair using your arms (e.g., wheelchair or bedside chair)?: Total Help needed to walk in hospital room?: Total Help needed climbing 3-5 steps with a railing? : Total 6 Click Score: 8    End of Session Equipment Utilized During Treatment: Gait belt Activity Tolerance: Patient limited by fatigue Patient left: in bed;with call bell/phone within reach;with bed alarm set Nurse Communication: Mobility status PT Visit Diagnosis: Unsteadiness on feet (R26.81)     Time:  3536-1443 PT Time Calculation (min) (ACUTE ONLY): 21 min  Charges:  $Gait Training: 8-22 mins                       Carney Living PT DPT  12/12/2018, 1:37 PM

## 2018-12-12 NOTE — NC FL2 (Addendum)
[place] LEVEL OF CARE SCREENING TOOL     IDENTIFICATION  Patient Name: Bruce Cole Birthdate: [birth year] Sex: male Admission Date (Current Location): 12/09/2018  [facility] and [place] Number:  Herbalist and Address:  [facility], [place] [address], [place], [place] [address]      Provider Number: [id number]  Attending Physician Name and Address:  Cristal Ford, DO  Relative Name and Phone Number:       Current Level of Care: Hospital Recommended Level of Care: [place] Prior Approval Number:    Date Approved/Denied:   PASRR Number: [id number]  Discharge Plan: SNF    Current Diagnoses: Patient Active Problem List   Diagnosis Date Noted  . Seizure ([place]) 12/09/2018  . Herpes 12/09/2018  . High risk medication use 03/17/2017  . Hyperkalemia 08/01/2016  . Aftercare following surgery of the circulatory system 06/17/2016  . Status post total replacement of right hip 03/22/2016  . ESRD on dialysis ([place]) 11/27/2015  . OSA (obstructive sleep apnea) 09/02/2013  . Chronic combined systolic and diastolic heart failure ([place]) 03/16/2013  . Allergic rhinitis 12/18/2012  . Long term current use of anticoagulant therapy 11/25/2010  . Genital herpes 05/31/2010  . DM (diabetes mellitus), type 2 with renal complications ([place]) 09/62/8366  . ERECTILE DYSFUNCTION, ORGANIC 09/21/2009  . Osteoarthritis 09/21/2009  . PERSONAL HX COLONIC POLYPS 08/25/2009  . Gout 07/22/2009  . Essential hypertension 07/22/2009  . ATRIAL FIBRILLATION 07/22/2009  . Rheumatoid arthritis ([place]) 07/22/2009  . NEPHROLITHIASIS, HX OF 07/22/2009    Orientation RESPIRATION BLADDER Height & Weight     Self, Time, Situation, Place  Normal Continent Weight:   Height:     BEHAVIORAL SYMPTOMS/MOOD NEUROLOGICAL BOWEL NUTRITION STATUS      Continent Diet(fluid restriction at 1200 mL)  AMBULATORY STATUS COMMUNICATION OF NEEDS Skin   Extensive  Assist Verbally Skin abrasions(blisters on buttocks, foam dressing; cellulitis on legs)                       Personal Care Assistance Level of Assistance  Bathing, Feeding, Dressing Bathing Assistance: Maximum assistance Feeding assistance: Independent Dressing Assistance: Maximum assistance     Functional Limitations Info  Sight, Hearing, Speech Sight Info: Impaired(wears glasses) Hearing Info: Adequate Speech Info: Adequate    SPECIAL CARE FACTORS FREQUENCY  PT (By licensed PT), OT (By licensed OT)     PT Frequency: 5x/wk OT Frequency: 5x/wk            Contractures Contractures Info: Not present    Additional Factors Info  Code Status, Allergies Code Status Info: Full Allergies Info: Ace Inhibitors           Current Medications (12/12/2018):  This is the current hospital active medication list Current Facility-Administered Medications  Medication Dose Route Frequency Provider Last Rate Last Dose  . acetaminophen (TYLENOL) tablet 650 mg  650 mg Oral Q6H PRN Karmen Bongo, MD       Or  . acetaminophen (TYLENOL) suppository 650 mg  650 mg Rectal Q6H PRN Karmen Bongo, MD      . acyclovir (ZOVIRAX) tablet 200 mg  200 mg Oral BID Einar Grad, RPH   200 mg at 12/11/18 2111  . atorvastatin (LIPITOR) tablet 10 mg  10 mg Oral Daily Karmen Bongo, MD   10 mg at 12/11/18 0934  . calcium carbonate (dosed in mg elemental calcium) suspension 500 mg of elemental calcium  500 mg  of elemental calcium Oral Q6H PRN Karmen Bongo, MD      . camphor-menthol [facility]) lotion 1 application  1 application Topical I6E PRN Karmen Bongo, MD       And  . hydrOXYzine (ATARAX/VISTARIL) tablet 25 mg  25 mg Oral Q8H PRN Karmen Bongo, MD      . Chlorhexidine Gluconate Cloth 2 % PADS 6 each  6 each Topical Q0600 Alric Seton, PA-C   6 each at 12/11/18 0620  . Chlorhexidine Gluconate Cloth 2 % PADS 6 each  6 each Topical Q0600 Roney Jaffe, MD      . Darbepoetin Alfa  (ARANESP) injection 150 mcg  150 mcg Intravenous Q Wed-HD Alric Seton, PA-C   150 mcg at 12/11/18 0227  . docusate sodium (ENEMEEZ) enema 283 mg  1 enema Rectal PRN Karmen Bongo, MD      . doxycycline (VIBRA-TABS) tablet 100 mg  100 mg Oral Q12H Regalado, Belkys A, MD   100 mg at 12/11/18 2111  . feeding supplement (NEPRO CARB STEADY) liquid 237 mL  237 mL Oral TID PRN Karmen Bongo, MD      . ferric citrate (AURYXIA) tablet 420 mg  420 mg Oral TID WC Alric Seton, PA-C   420 mg at 12/12/18 0827  . ferric gluconate (NULECIT) 125 mg in sodium chloride 0.9 % 100 mL IVPB  125 mg Intravenous Q M,W,F-HD Alric Seton, PA-C   Stopped at 12/11/18 0330  . gabapentin (NEURONTIN) capsule 100 mg  100 mg Oral BID Karmen Bongo, MD   100 mg at 12/12/18 7035  . guaiFENesin (MUCINEX) 12 hr tablet 600 mg  600 mg Oral BID Regalado, Belkys A, MD   600 mg at 12/11/18 2110  . levETIRAcetam (KEPPRA) tablet 250 mg  250 mg Oral Once per day on Tue Thu Sat Kerney Elbe, MD   250 mg at 12/11/18 2110   And  . levETIRAcetam (KEPPRA) tablet 500 mg  500 mg Oral Daily Kerney Elbe, MD   500 mg at 12/11/18 0916  . LORazepam (ATIVAN) injection 1-2 mg  1-2 mg Intravenous Q2H PRN Karmen Bongo, MD      . midodrine (PROAMATINE) tablet 10 mg  10 mg Oral Q M,W,F-HD Alric Seton, PA-C   10 mg at 12/10/18 1251  . midodrine (PROAMATINE) tablet 5 mg  5 mg Oral BID WC Alric Seton, PA-C   5 mg at 12/12/18 0827  . multivitamin (RENA-VIT) tablet 1 tablet  1 tablet Oral QHS Alric Seton, PA-C   1 tablet at 12/11/18 2110  . mupirocin cream (BACTROBAN) 2 %   Topical Daily Regalado, Belkys A, MD      . ondansetron (ZOFRAN) tablet 4 mg  4 mg Oral Q6H PRN Karmen Bongo, MD       Or  . ondansetron [facility]) injection 4 mg  4 mg Intravenous Q6H PRN Karmen Bongo, MD      . pantoprazole (PROTONIX) EC tablet 40 mg  40 mg Oral Daily Karmen Bongo, MD   40 mg at 12/11/18 0934  . sevelamer carbonate (RENVELA) tablet  2,400 mg  2,400 mg Oral TID WC Karmen Bongo, MD   2,400 mg at 12/12/18 0093  . sorbitol 70 % solution 30 mL  30 mL Oral PRN Karmen Bongo, MD      . traMADol Veatrice Bourbon) tablet 50 mg  50 mg Oral BID PRN Karmen Bongo, MD   50 mg at 12/11/18 0935  . zolpidem (AMBIEN) tablet 5 mg  5 mg Oral  QHS PRN Karmen Bongo, MD         Discharge Medications: Please see discharge summary for a list of discharge medications.  Relevant Imaging Results:  Relevant Lab Results:   Additional Information SS#: [id number]; HD MWF at [facility], LCSW

## 2018-12-12 NOTE — Progress Notes (Signed)
ANTICOAGULATION CONSULT NOTE - Initial Consult  Pharmacy Consult for warfarin Indication: atrial fibrillation  Allergies  Allergen Reactions  . Ace Inhibitors Other (See Comments)    Worsening renal insufficiency    Patient Measurements:    Vital Signs: Temp: 97.4 F (36.3 C) (05/27 1150) Temp Source: Oral (05/27 1150) BP: 90/54 (05/27 1200) Pulse Rate: 55 (05/27 1200)  Labs: Recent Labs    12/09/18 1628 12/09/18 2303 12/10/18 0355 12/11/18 0722 12/11/18 0727 12/12/18 0348  HGB  --   --  9.4*  --  9.7*  --   HCT  --   --  29.6*  --  29.8*  --   PLT  --   --  194  --  190  --   LABPROT  --   --  34.2* 31.9*  --  31.9*  INR  --   --  3.5* 3.2*  --  3.2*  CREATININE  --   --  8.20*  --  5.17*  --   TROPONINI 0.06* 0.07* 0.07*  --   --   --     Estimated Creatinine Clearance: 16.7 mL/min (A) (by C-G formula based on SCr of 5.17 mg/dL (H)).   Medical History: Past Medical History:  Diagnosis Date  . Acute blood loss anemia   . Atrial fibrillation (HCC)    Chronic  . Chronic combined systolic and diastolic heart failure ([place])   . Colon polyp 2000  . Dysrhythmia    hx  . ESRD on hemodialysis ([place])    [place]  . Essential hypertension   . Gastritis and gastroduodenitis    with bleeding  . Gout   . Heart murmur   . Herpes   . History of blood transfusion   . Nephrolithiasis   . OSA (obstructive sleep apnea) 09/02/2013    IMPRESSION :  1. Mild obstructive sleep apnea with hypopneas causing sleep fragmentation and moderate oxygen desaturation.  2. Short runs of nonsustained VT were noted. His beta blocker may need to be titrated 3. Significant PLM's were noted, the PLM arousal index was low. Please correlate with clinical history of restless leg syndrome.  4. Sleep efficiency was poor.  RECOMMENDATION:  1. Treatment options for this degree of sleep disordered breathing include weight loss and positional therapy to avoid supine sleep 2. Consider  titrating beta blocker further, defer to cardiologist 3. Patient should be cautioned against driving when sleepy.They should be asked to avoid medications with sedative side effects     . Osteoarthritis of right hip   . Pneumonia    hx 30 yrs ago  . Primary osteoarthritis of right hip   . Rheumatoid arthritis ([place])   . Thrombocytopenia ([place])     Assessment: 65 yoM admitted with seizures s/p fall with small hematoma to back of head. Pt on warfarin PTA for hx AFib which was held on admission due to possible need for LP which is not needed anymore from neuro standpoint. Last dose of warfarin was 5/23. INR today remains elevated at 3.2, CBC stable.   *PTA Dose = 5mg  daily  Goal of Therapy:  INR 2-3 Monitor platelets by anticoagulation protocol: Yes   Plan:  -Hold warfarin tonight -INR tomorrow   Charleton Deyoung A. Levada Dy, PharmD, [place] Pager: [phone] Please utilize Amion for appropriate phone number to reach the unit pharmacist ([place])   12/12/2018

## 2018-12-12 NOTE — Progress Notes (Signed)
Bruce Cole Progress Note  Subjective: appears lethargic today, jerking some in UE's  Vitals:   12/11/18 2327 12/12/18 0000 12/12/18 0312 12/12/18 0756  BP: (!) 87/52 90/60 (!) 95/58 102/64  Pulse: (!) 52  (!) 57 (!) 55  Resp: 20  20 18   Temp: 97.8 F (36.6 C)  98.4 F (36.9 C) 98.4 F (36.9 C)  TempSrc: Oral  Oral Oral  SpO2: 100%  97% 95%    Inpatient medications: . acyclovir  200 mg Oral BID  . atorvastatin  10 mg Oral Daily  . Chlorhexidine Gluconate Cloth  6 each Topical Q0600  . Chlorhexidine Gluconate Cloth  6 each Topical Q0600  . darbepoetin (ARANESP) injection - DIALYSIS  150 mcg Intravenous Q Wed-HD  . doxycycline  100 mg Oral Q12H  . ferric citrate  420 mg Oral TID WC  . gabapentin  100 mg Oral BID  . guaiFENesin  600 mg Oral BID  . heparin      . levETIRAcetam  250 mg Oral Once per day on Tue Thu Sat   And  . levETIRAcetam  500 mg Oral Daily  . midodrine  10 mg Oral Q M,W,F-HD  . midodrine  5 mg Oral BID WC  . multivitamin  1 tablet Oral QHS  . mupirocin cream   Topical Daily  . pantoprazole  40 mg Oral Daily  . sevelamer carbonate  2,400 mg Oral TID WC   . ferric gluconate (FERRLECIT/NULECIT) IV Stopped (12/11/18 0330)   acetaminophen **OR** acetaminophen, calcium carbonate (dosed in mg elemental calcium), camphor-menthol **AND** hydrOXYzine, docusate sodium, feeding supplement (NEPRO CARB STEADY), LORazepam, ondansetron **OR** ondansetron (ZOFRAN) IV, sorbitol, traMADol, zolpidem    Exam: General: obese WDWN NAD alert and Ox 3 Lungs: DIm BS. Breathing is unlabored. Heart: irreg Abdomen: obese soft NT + BS Skin: areas of breakdown on buttock not examined  Lower extremities: L > R LE edema scabbed areas left posterior calf, 1+ bilat edema Neuro: Ox 3, lethargic, +myoclonic jerking UE's mild  L upper AVF + bruit   MWF AF   4h  125.5kg  425/800   P2  2/2.25 bath  LUA AVF  Hep 6000 Mircera 150 5/13  Hectorol 4  Recent labs: hgb 10.5  14% sat ferritin 66 Ca/P ok   Assessment/Plan: 1. Encephalopathy - etiology per neuro felt to be uremia related and not seizures but asterixis. Got better now worse again. HD adequacy has been good, no signs of recirculation on exam. No good evidence for uremia, will dc neurontin and tramadol and see if this helps w/ myoclonus/ lethargy. Use tylenol for pain the next couple of days please.  2. Recent seizure diagnosis: continues on Keppra but dose lowered per neuro for ESRD.   3. ESRD -  MWF. HD today.  Adequacy ~ 1.4 at OP HD, access flow stable 800. No sure cause of myoclonus. BUN/Cr not excessively high.  4. Hypotension/volume  - on midodrine 5 bid  Per outpatient records  may need more -BP drop into 60 - 70s during HD treatments- added 10 pre HD specifically- may contribute to bradycardia though 5. Anemia  -hgb 9.4  review of outpatient labs show chronic low tsat despite IV Fe- on chronic coumadin - tsat  14% sat ferritin 66 - replete Fe check hemocult - continue ESA - due 5/27  6. Metabolic bone disease - pt states is on renvela 2 ac (outpt med list says 3)  and Aurixya 2 ac- no sensipar/continue Hectorol  7. Nutrition - renal diet/vits 8. Afib - INR 3.5  9. Frequent falls - per HD note 5/13 had been getting home PT-  10. Disp - needs at least short term placement for rehab    [facility] 12/12/2018, 12:15 PM  Iron/TIBC/Ferritin/ %Sat    Component Value Date/Time   IRON 14 (L) 11/12/2015 0507   TIBC 392 11/12/2015 0507   FERRITIN 18 (L) 11/12/2015 0507   IRONPCTSAT 4 (L) 11/12/2015 0507   Recent Labs  Lab 12/10/18 0355  12/11/18 0727 12/12/18 0348  NA 138  --  135  --   K 4.6  --  3.7  --   CL 97*  --  96*  --   CO2 26  --  27  --   GLUCOSE 108*  --  104*  --   BUN 49*  --  22  --   CREATININE 8.20*  --  5.17*  --   CALCIUM 9.8  --  8.9  --   ALBUMIN 2.8*  --   --   --   INR 3.5*   < >  --  3.2*   < > = values in this interval not displayed.    Recent Labs  Lab 12/10/18 0355  AST 18  ALT 15  ALKPHOS 265*  BILITOT 0.2*  PROT 7.0   Recent Labs  Lab 12/11/18 0727  WBC 8.7  HGB 9.7*  HCT 29.8*  PLT 190

## 2018-12-12 NOTE — Progress Notes (Signed)
PROGRESS NOTE    Bruce Cole  TGG:[id number] DOB: 12/23/1940 DOA: 12/09/2018 PCP: Debbrah Alar, NP   Brief Narrative:  HPI On 12/09/2018 by Dr. Loney Laurence is a 78 y.o. male with medical history significant of RA; OSA; nephrolithiasis; HTN; ESRD on TTS HD; afib on Coumadin; and recent diagnosis with seizures; chronic combined CHF presenting with breakthrough seizures.  Upon my entering the room, the patient was again very somnolent and confused.  However, within a few minutes, he became more alert and conversant and was oriented and able to answer questions.  He reports "a while" of generalized tremors after which he becomes confused and disoriented.  He also reports "herpes" on his left buttock with intermittent use of Valtrex and not recently.  It is painful.  Patient was [facility] at [facility] on 4/[facility] for a fall but did not appear to have significant injury and did not hit his head.  He had a virtual visit with his PCP on 5/19 for "tremors" starting after the 4/28 fall.  He was referred to neurology and had a CT ordered to r/o SDH.  He went to the ER a few hours later because he fell out of bed during one of these tremors.  While in the ER, he had a witnessed episode of R arm twitching and then was confused for a few minutes after.  Dr. Rory Percy was consulted by telephone and recommended initiation of Keppra and outpatient neurology f/u.   He returned to the ER on 5/20 after developing slurred speech and AMS during HD.   This was thought to be associated with hypotension during HD.   Admission was recommended but he was "adamant that he is going home today and that he never should have been brought here."  He returned to the ER on the following day, 5/21, with epistaxis; labs were stable and he was discharged.  He had a virtual visit with his PCP on 5/22.  The son "is concerned that he can no longer be alone and they are working on getting him additional help and or considering  placement."  She felt that his seizures were the primary contributing factor to his falls and confusion.  Of note, he was seen on 11/22 for a "tender scabbed area on right lateral buttock".  This was thought to be resolving herpes zoster.  He was already on Neurontin (also tried Lyrica) and was ordered capsaicin cream; she later ordered lidoderm patches.  The patient thought that this was a flare up of HSV2 according to a 2/12 telephone call and she prescribed daily Valtrex that day.  Per his son, episodes were every few hours since Tuesday.  They have been escalating in time and now seem to rapidly fluctuate between altered and normal. Assessment & Plan   Seizure -Patient with recent diagnosis of seizure started on Keppra on 12/07/2018 -Seizure-like activity thought to be secondary to metabolic encephalopathy (anemia) versus postictal state  -EEG consistent with generalized nonspecific cerebral dysfunction, encephalopathy.  No seizure or seizure disposition recorded on study -CT head showed no acute intracranial normality -MRI brain showed no acute intracranial normality.  Chronic left cerebellar infarct and minimal chronic small vessel ischemic disease and cerebral white matter -Neurology consulted and appreciated, no LP or need for Dilantin -Currently on acyclovir for possible encephalitis -Vitamin B12 1105, RPR nonreactive, TSH 1.2, vitamin B1 pending  Herpes zoster -Noted on buttocks -Dermatomal ulcerations in various stages of healing along the left buttocks -Continue oral  acyclovir  Atrial fibrillation -Chronic -Continue Coumadin  Chronic combined CHF -Continue vomiting control with hemodialysis  Hypotension -Continue Midodrine  Diabetes mellitus, type II -Last hemoglobin A1c 6.2 in 2019 -diet controlled?  Large right submandibular calculus  -Incidental finding, patient will need to follow-up as an outpatient  Right lower extremity erythema-possible cellulitis -Patient  started on doxycycline  DVT Prophylaxis Coumadin  Code Status: Full  Family Communication: None at bedside  Disposition Plan: Admitted.  Pending SNF placement.  Consultants Nephrology Neurology  Procedures  EEG  Antibiotics   Anti-infectives (From admission, onward)   Start     Dose/Rate Route Frequency Ordered Stop   12/11/18 1000  acyclovir (ZOVIRAX) tablet 200 mg     200 mg Oral 2 times daily 12/10/18 1609     12/10/18 2100  acyclovir (ZOVIRAX) tablet 400 mg     400 mg Oral  Once 12/10/18 1609 12/11/18 0038   12/10/18 2000  acyclovir (ZOVIRAX) 500 mg in dextrose 5 % 100 mL IVPB  Status:  Discontinued     500 mg 110 mL/hr over 60 Minutes Intravenous Every 24 hours 12/09/18 1224 12/10/18 1557   12/10/18 1545  doxycycline (VIBRA-TABS) tablet 100 mg     100 mg Oral Every 12 hours 12/10/18 1531     12/09/18 1230  acyclovir (ZOVIRAX) 500 mg in dextrose 5 % 100 mL IVPB     500 mg 110 mL/hr over 60 Minutes Intravenous  Once 12/09/18 1224 12/09/18 1524      Subjective:   Bruce Cole seen and examined today.  Patient with no complaints this morning.  Denies current chest pain, shortness of breath, abdominal pain, nausea or vomiting, diarrhea constipation, dizziness or headache.  Objective:   Vitals:   12/12/18 0756 12/12/18 1150 12/12/18 1154 12/12/18 1200  BP: 102/64 (!) 91/54 (!) 94/57 (!) 90/54  Pulse: (!) 55 (!) 24 (!) 56 (!) 55  Resp: 18 (!) 24 (!) 24 (!) 21  Temp: 98.4 F (36.9 C) (!) 97.4 F (36.3 C)    TempSrc: Oral Oral    SpO2: 95% 95%      Intake/Output Summary (Last 24 hours) at 12/12/2018 1325 Last data filed at 12/12/2018 0900 Gross per 24 hour  Intake 120 ml  Output --  Net 120 ml   There were no vitals filed for this visit.  Exam  General: Well developed, well nourished, NAD, appears stated age  19: NCAT, mucous membranes moist.   Neck: Supple   Cardiovascular: S1 S2 auscultated, irregular  Respiratory: Diminished breath sounds  however clear  Abdomen: Soft, obese, nontender, nondistended, + bowel sounds  Extremities: warm dry without cyanosis clubbing. +1 LE Edema B/L, mild erythema RLE  Neuro: AAOx3, nonfocal  Psych: Appropriate mood and affect   Data Reviewed: I have personally reviewed following labs and imaging studies  CBC: Recent Labs  Lab 12/06/18 1150 12/09/18 0728 12/10/18 0355 12/11/18 0727  WBC 8.1 8.8 7.6 8.7  NEUTROABS  --  5.8  --   --   HGB 9.8* 10.2* 9.4* 9.7*  HCT 32.0* 32.5* 29.6* 29.8*  MCV 100.0 97.0 96.4 95.5  PLT 234 222 194 448   Basic Metabolic Panel: Recent Labs  Lab 12/06/18 1150 12/09/18 0728 12/10/18 0355 12/11/18 0727  NA 137 138 138 135  K 3.5 3.9 4.6 3.7  CL 97* 95* 97* 96*  CO2 29 25 26 27   GLUCOSE 144* 146* 108* 104*  BUN 30* 40* 49* 22  CREATININE 6.24* 6.97* 8.20*  5.17*  CALCIUM 10.2 10.5* 9.8 8.9  MG  --  2.8*  --   --    GFR: Estimated Creatinine Clearance: 16.7 mL/min (A) (by C-G formula based on SCr of 5.17 mg/dL (H)). Liver Function Tests: Recent Labs  Lab 12/09/18 0728 12/10/18 0355  AST 25 18  ALT 15 15  ALKPHOS 282* 265*  BILITOT 0.6 0.2*  PROT 8.0 7.0  ALBUMIN 3.1* 2.8*   No results for input(s): LIPASE, AMYLASE in the last 168 hours. Recent Labs  Lab 12/09/18 1628  AMMONIA 45*   Coagulation Profile: Recent Labs  Lab 12/09/18 0728 12/10/18 0355 12/11/18 0722 12/12/18 0348  INR 3.2* 3.5* 3.2* 3.2*   Cardiac Enzymes: Recent Labs  Lab 12/05/18 1421 12/09/18 0728 12/09/18 1628 12/09/18 2303 12/10/18 0355  TROPONINI 0.07* 0.08* 0.06* 0.07* 0.07*   BNP (last 3 results) No results for input(s): PROBNP in the last 8760 hours. HbA1C: No results for input(s): HGBA1C in the last 72 hours. CBG: Recent Labs  Lab 12/09/18 0757  GLUCAP 124*   Lipid Profile: No results for input(s): CHOL, HDL, LDLCALC, TRIG, CHOLHDL, LDLDIRECT in the last 72 hours. Thyroid Function Tests: Recent Labs    12/11/18 0722  TSH 1.271    Anemia Panel: Recent Labs    12/11/18 0722  VITAMINB12 1,105*   Urine analysis:    Component Value Date/Time   COLORURINE YELLOW 11/10/2015 1800   APPEARANCEUR CLEAR 11/10/2015 1800   LABSPEC 1.012 11/10/2015 1800   PHURINE 5.5 11/10/2015 1800   GLUCOSEU NEGATIVE 11/10/2015 1800   GLUCOSEU NEGATIVE 05/05/2015 1135   HGBUR NEGATIVE 11/10/2015 1800   HGBUR negative 09/21/2009 1008   BILIRUBINUR NEGATIVE 11/10/2015 1800   KETONESUR NEGATIVE 11/10/2015 1800   PROTEINUR NEGATIVE 11/10/2015 1800   UROBILINOGEN 0.2 05/05/2015 1135   NITRITE NEGATIVE 11/10/2015 1800   LEUKOCYTESUR NEGATIVE 11/10/2015 1800   Sepsis Labs: @LABRCNTIP (procalcitonin:4,lacticidven:4)  ) Recent Results (from the past 240 hour(s))  SARS Coronavirus 2 (CEPHEID - Performed in [place] hospital lab), Hosp Order     Status: None   Collection Time: 12/09/18  8:12 AM  Result Value Ref Range Status   SARS Coronavirus 2 NEGATIVE NEGATIVE Final    Comment: (NOTE) If result is NEGATIVE SARS-CoV-2 target nucleic acids are NOT DETECTED. The SARS-CoV-2 RNA is generally detectable in upper and lower  respiratory specimens during the acute phase of infection. The lowest  concentration of SARS-CoV-2 viral copies this assay can detect is 250  copies / mL. A negative result does not preclude SARS-CoV-2 infection  and should not be used as the sole basis for treatment or other  patient management decisions.  A negative result may occur with  improper specimen collection / handling, submission of specimen other  than nasopharyngeal swab, presence of viral mutation(s) within the  areas targeted by this assay, and inadequate number of viral copies  (<250 copies / mL). A negative result must be combined with clinical  observations, patient history, and epidemiological information. If result is POSITIVE SARS-CoV-2 target nucleic acids are DETECTED. The SARS-CoV-2 RNA is generally detectable in upper and lower   respiratory specimens dur ing the acute phase of infection.  Positive  results are indicative of active infection with SARS-CoV-2.  Clinical  correlation with patient history and other diagnostic information is  necessary to determine patient infection status.  Positive results do  not rule out bacterial infection or co-infection with other viruses. If result is PRESUMPTIVE POSTIVE SARS-CoV-2 nucleic acids MAY BE  PRESENT.   A presumptive positive result was obtained on the submitted specimen  and confirmed on repeat testing.  While 2019 novel coronavirus  (SARS-CoV-2) nucleic acids may be present in the submitted sample  additional confirmatory testing may be necessary for epidemiological  and / or clinical management purposes  to differentiate between  SARS-CoV-2 and other Sarbecovirus currently known to infect humans.  If clinically indicated additional testing with an alternate test  methodology [phone]) is advised. The SARS-CoV-2 RNA is generally  detectable in upper and lower respiratory sp ecimens during the acute  phase of infection. The expected result is Negative. Fact Sheet for Patients:  StrictlyIdeas.no Fact Sheet for Healthcare Providers: BankingDealers.co.za This test is not yet approved or cleared by the [place] FDA and has been authorized for detection and/or diagnosis of SARS-CoV-2 by FDA under an Emergency Use Authorization (EUA).  This EUA will remain in effect (meaning this test can be used) for the duration of the COVID-19 declaration under Section 564(b)(1) of the Act, 21 U.S.C. section 360bbb-3(b)(1), unless the authorization is terminated or revoked sooner. Performed at [facility], [place] [address] [place], [place] [address]       Radiology Studies: No results found.   Scheduled Meds:  acyclovir  200 mg Oral BID   atorvastatin  10 mg Oral Daily   Chlorhexidine Gluconate Cloth  6 each  Topical Q0600   Chlorhexidine Gluconate Cloth  6 each Topical Q0600   darbepoetin (ARANESP) injection - DIALYSIS  150 mcg Intravenous Q Wed-HD   doxycycline  100 mg Oral Q12H   ferric citrate  420 mg Oral TID WC   guaiFENesin  600 mg Oral BID   heparin       [START ON 12/13/2018] heparin  6,000 Units Dialysis Once in dialysis   levETIRAcetam  250 mg Oral Once per day on Tue Thu Sat   And   levETIRAcetam  500 mg Oral Daily   midodrine  10 mg Oral Q M,W,F-HD   midodrine  5 mg Oral BID WC   multivitamin  1 tablet Oral QHS   mupirocin cream   Topical Daily   pantoprazole  40 mg Oral Daily   sevelamer carbonate  2,400 mg Oral TID WC   Continuous Infusions:  ferric gluconate (FERRLECIT/NULECIT) IV Stopped (12/11/18 0330)     LOS: 2 days   Time Spent in minutes   30 minutes  Conor Lata D.O. on 12/12/2018 at 1:25 PM  Between 7am to 7pm - Please see pager noted on [email or web address]  After 7pm go to [email or web address]  And look for the night coverage person covering for me after hours  [facility] Office  [phone]

## 2018-12-12 NOTE — TOC Initial Note (Signed)
Transition of Care [facility]) - Initial/Assessment Note    Patient Details  Name: Bruce Cole MRN: [id number] Date of Birth: [birth year]  [facility]) CM/SW Contact:    Geralynn Ochs, LCSW Phone Number: 12/12/2018, 10:58 AM  Clinical Narrative:   CSW met with patient to discuss recommendation for SNF. Patient acknowledged understanding, and that he had been to SNF before. Patient said he'd been to [facility] but it was not a pleasant experience, would prefer looking at other options. CSW received permission to fax out referral and provide bed offers. CSW to provide CMS list with bed offers this afternoon.                 Expected Discharge Plan: Skilled Nursing Facility Barriers to Discharge: Continued Medical Work up   Patient Goals and CMS Choice Patient states their goals for this hospitalization and ongoing recovery are:: to get stronger CMS Medicare.gov Compare Post Acute Care list provided to:: Patient Choice offered to / list presented to : Patient  Expected Discharge Plan and Services Expected Discharge Plan: [place] Choice: [place] arrangements for the past 2 months: Single Family Home                                      Prior Living Arrangements/Services Living arrangements for the past 2 months: Single Family Home Lives with:: Self Patient language and need for interpreter reviewed:: No Do you feel safe going back to the place where you live?: Yes      Need for Family Participation in Patient Care: No (Comment) Care giver support system in place?: No (comment)   Criminal Activity/Legal Involvement Pertinent to Current Situation/Hospitalization: No - Comment as needed  Activities of Daily Living Home Assistive Devices/Equipment: Gilford Rile (specify type) ADL Screening (condition at time of admission) Patient's cognitive ability adequate to safely complete daily activities?: Yes Is  the patient deaf or have difficulty hearing?: No Does the patient have difficulty seeing, even when wearing glasses/contacts?: No Does the patient have difficulty concentrating, remembering, or making decisions?: Yes Patient able to express need for assistance with ADLs?: Yes Does the patient have difficulty dressing or bathing?: No Independently performs ADLs?: Yes (appropriate for developmental age) Does the patient have difficulty walking or climbing stairs?: No Weakness of Legs: Both Weakness of Arms/Hands: None  Permission Sought/Granted Permission sought to share information with : Chartered certified accountant granted to share information with : Yes, Verbal Permission Granted     Permission granted to share info w AGENCY: SNF        Emotional Assessment Appearance:: Appears stated age Attitude/Demeanor/Rapport: Engaged Affect (typically observed): Pleasant Orientation: : Oriented to Self, Oriented to Place, Oriented to  Time, Oriented to Situation Alcohol / Substance Use: Not Applicable Psych Involvement: No (comment)  Admission diagnosis:  Disorientation [R41.0] Seizure-like activity ([place]) [R56.9] Patient Active Problem List   Diagnosis Date Noted  . Seizure ([place]) 12/09/2018  . Herpes 12/09/2018  . High risk medication use 03/17/2017  . Hyperkalemia 08/01/2016  . Aftercare following surgery of the circulatory system 06/17/2016  . Status post total replacement of right hip 03/22/2016  . ESRD on dialysis ([place]) 11/27/2015  . OSA (obstructive sleep apnea) 09/02/2013  . Chronic combined systolic and diastolic heart failure ([place]) 03/16/2013  . Allergic rhinitis 12/18/2012  . Long term current use of  anticoagulant therapy 11/25/2010  . Genital herpes 05/31/2010  . DM (diabetes mellitus), type 2 with renal complications ([place]) 81/85/9093  . ERECTILE DYSFUNCTION, ORGANIC 09/21/2009  . Osteoarthritis 09/21/2009  . PERSONAL HX COLONIC POLYPS 08/25/2009  . Gout  07/22/2009  . Essential hypertension 07/22/2009  . ATRIAL FIBRILLATION 07/22/2009  . Rheumatoid arthritis ([place]) 07/22/2009  . NEPHROLITHIASIS, HX OF 07/22/2009   PCP:  Debbrah Alar, NP Pharmacy:   [facility] [phone] [address], [address] AT [facility] [place] [address] Phone: [phone] Fax: [phone]     Social Determinants of Health ([place]) Interventions    Readmission Risk Interventions No flowsheet data found.

## 2018-12-13 DIAGNOSIS — K219 Gastro-esophageal reflux disease without esophagitis: Secondary | ICD-10-CM | POA: Diagnosis not present

## 2018-12-13 DIAGNOSIS — R410 Disorientation, unspecified: Secondary | ICD-10-CM | POA: Diagnosis not present

## 2018-12-13 DIAGNOSIS — M2550 Pain in unspecified joint: Secondary | ICD-10-CM | POA: Diagnosis not present

## 2018-12-13 DIAGNOSIS — E785 Hyperlipidemia, unspecified: Secondary | ICD-10-CM | POA: Diagnosis not present

## 2018-12-13 DIAGNOSIS — K113 Abscess of salivary gland: Secondary | ICD-10-CM | POA: Diagnosis not present

## 2018-12-13 DIAGNOSIS — I9589 Other hypotension: Secondary | ICD-10-CM | POA: Diagnosis not present

## 2018-12-13 DIAGNOSIS — G9341 Metabolic encephalopathy: Secondary | ICD-10-CM | POA: Diagnosis not present

## 2018-12-13 DIAGNOSIS — I4891 Unspecified atrial fibrillation: Secondary | ICD-10-CM | POA: Diagnosis not present

## 2018-12-13 DIAGNOSIS — D631 Anemia in chronic kidney disease: Secondary | ICD-10-CM | POA: Diagnosis not present

## 2018-12-13 DIAGNOSIS — G9349 Other encephalopathy: Secondary | ICD-10-CM | POA: Diagnosis not present

## 2018-12-13 DIAGNOSIS — I132 Hypertensive heart and chronic kidney disease with heart failure and with stage 5 chronic kidney disease, or end stage renal disease: Secondary | ICD-10-CM | POA: Diagnosis not present

## 2018-12-13 DIAGNOSIS — A6000 Herpesviral infection of urogenital system, unspecified: Secondary | ICD-10-CM | POA: Diagnosis not present

## 2018-12-13 DIAGNOSIS — B009 Herpesviral infection, unspecified: Secondary | ICD-10-CM | POA: Diagnosis not present

## 2018-12-13 DIAGNOSIS — I959 Hypotension, unspecified: Secondary | ICD-10-CM | POA: Diagnosis not present

## 2018-12-13 DIAGNOSIS — I158 Other secondary hypertension: Secondary | ICD-10-CM | POA: Diagnosis not present

## 2018-12-13 DIAGNOSIS — I10 Essential (primary) hypertension: Secondary | ICD-10-CM | POA: Diagnosis not present

## 2018-12-13 DIAGNOSIS — Z7401 Bed confinement status: Secondary | ICD-10-CM | POA: Diagnosis not present

## 2018-12-13 DIAGNOSIS — M1990 Unspecified osteoarthritis, unspecified site: Secondary | ICD-10-CM | POA: Diagnosis not present

## 2018-12-13 DIAGNOSIS — M069 Rheumatoid arthritis, unspecified: Secondary | ICD-10-CM | POA: Diagnosis not present

## 2018-12-13 DIAGNOSIS — N186 End stage renal disease: Secondary | ICD-10-CM | POA: Diagnosis not present

## 2018-12-13 DIAGNOSIS — I509 Heart failure, unspecified: Secondary | ICD-10-CM | POA: Diagnosis not present

## 2018-12-13 DIAGNOSIS — Z992 Dependence on renal dialysis: Secondary | ICD-10-CM | POA: Diagnosis not present

## 2018-12-13 DIAGNOSIS — R2689 Other abnormalities of gait and mobility: Secondary | ICD-10-CM | POA: Diagnosis not present

## 2018-12-13 DIAGNOSIS — I482 Chronic atrial fibrillation, unspecified: Secondary | ICD-10-CM | POA: Diagnosis not present

## 2018-12-13 DIAGNOSIS — N2581 Secondary hyperparathyroidism of renal origin: Secondary | ICD-10-CM | POA: Diagnosis not present

## 2018-12-13 DIAGNOSIS — E7849 Other hyperlipidemia: Secondary | ICD-10-CM | POA: Diagnosis not present

## 2018-12-13 DIAGNOSIS — E8779 Other fluid overload: Secondary | ICD-10-CM | POA: Diagnosis not present

## 2018-12-13 DIAGNOSIS — G40909 Epilepsy, unspecified, not intractable, without status epilepticus: Secondary | ICD-10-CM | POA: Diagnosis not present

## 2018-12-13 DIAGNOSIS — E1122 Type 2 diabetes mellitus with diabetic chronic kidney disease: Secondary | ICD-10-CM | POA: Diagnosis not present

## 2018-12-13 DIAGNOSIS — R296 Repeated falls: Secondary | ICD-10-CM | POA: Diagnosis not present

## 2018-12-13 DIAGNOSIS — R600 Localized edema: Secondary | ICD-10-CM | POA: Diagnosis not present

## 2018-12-13 DIAGNOSIS — B029 Zoster without complications: Secondary | ICD-10-CM | POA: Diagnosis not present

## 2018-12-13 DIAGNOSIS — G4709 Other insomnia: Secondary | ICD-10-CM | POA: Diagnosis not present

## 2018-12-13 DIAGNOSIS — I5042 Chronic combined systolic (congestive) and diastolic (congestive) heart failure: Secondary | ICD-10-CM | POA: Diagnosis not present

## 2018-12-13 DIAGNOSIS — E1129 Type 2 diabetes mellitus with other diabetic kidney complication: Secondary | ICD-10-CM | POA: Diagnosis not present

## 2018-12-13 DIAGNOSIS — L988 Other specified disorders of the skin and subcutaneous tissue: Secondary | ICD-10-CM | POA: Diagnosis not present

## 2018-12-13 DIAGNOSIS — R569 Unspecified convulsions: Secondary | ICD-10-CM | POA: Diagnosis not present

## 2018-12-13 DIAGNOSIS — R278 Other lack of coordination: Secondary | ICD-10-CM | POA: Diagnosis not present

## 2018-12-13 LAB — VITAMIN B1: Vitamin B1 (Thiamine): 180 nmol/L (ref 66.5–200.0)

## 2018-12-13 LAB — PROTIME-INR
INR: 2.7 — ABNORMAL HIGH (ref 0.8–1.2)
Prothrombin Time: 28.5 seconds — ABNORMAL HIGH (ref 11.4–15.2)

## 2018-12-13 MED ORDER — HYDROXYZINE HCL 25 MG PO TABS: 25.0000 mg | ORAL_TABLET | Freq: Three times a day (TID) | ORAL | 0 refills | Status: AC | PRN

## 2018-12-13 MED ORDER — WARFARIN SODIUM 2.5 MG PO TABS: 2.5000 mg | ORAL_TABLET | Freq: Once | ORAL | Status: DC

## 2018-12-13 MED ORDER — MIDODRINE HCL 10 MG PO TABS: 10.0000 mg | ORAL_TABLET | ORAL | Status: AC

## 2018-12-13 MED ORDER — WARFARIN - PHARMACIST DOSING INPATIENT
Freq: Every day | Status: DC
  Administered 2018-12-13: 19:00:00

## 2018-12-13 MED ORDER — NEPRO/CARBSTEADY PO LIQD: 237.0000 mL | Freq: Three times a day (TID) | ORAL | 0 refills | Status: AC | PRN

## 2018-12-13 MED ORDER — CAMPHOR-MENTHOL 0.5-0.5 % EX LOTN: 1.0000 "application " | TOPICAL_LOTION | Freq: Three times a day (TID) | CUTANEOUS | 0 refills | Status: DC | PRN

## 2018-12-13 MED ORDER — LEVETIRACETAM 250 MG PO TABS: ORAL_TABLET | ORAL | Status: DC

## 2018-12-13 MED ORDER — MIDODRINE HCL 5 MG PO TABS: 5.0000 mg | ORAL_TABLET | Freq: Two times a day (BID) | ORAL | Status: AC

## 2018-12-13 MED ORDER — ACYCLOVIR 400 MG PO TABS: 200.0000 mg | ORAL_TABLET | Freq: Two times a day (BID) | ORAL | 0 refills | Status: AC

## 2018-12-13 MED ORDER — MUPIROCIN CALCIUM 2 % EX CREA: TOPICAL_CREAM | Freq: Every day | CUTANEOUS | 0 refills | Status: DC

## 2018-12-13 MED ORDER — FERRIC CITRATE 1 GM 210 MG(FE) PO TABS: 420.0000 mg | ORAL_TABLET | Freq: Three times a day (TID) | ORAL | Status: AC

## 2018-12-13 MED ORDER — LEVETIRACETAM 500 MG PO TABS: 500.0000 mg | ORAL_TABLET | Freq: Every day | ORAL | 0 refills | Status: DC

## 2018-12-13 MED ORDER — DOXYCYCLINE HYCLATE 100 MG PO TABS: 100.0000 mg | ORAL_TABLET | Freq: Two times a day (BID) | ORAL | 0 refills | Status: AC

## 2018-12-13 MED ORDER — WARFARIN SODIUM 2.5 MG PO TABS
2.5000 mg | ORAL_TABLET | Freq: Once | ORAL | Status: AC
  Administered 2018-12-13: 2.5 mg via ORAL
  Filled 2018-12-13: qty 1

## 2018-12-13 NOTE — Discharge Instructions (Signed)
Seizure, Adult °When you have a seizure: °· Parts of your body may move. °· You may have a change in how aware or awake (conscious) you are. °· You may shake (convulse). °Seizures usually last from 30 seconds to 2 minutes. Usually, they are not harmful unless they last a long time. °What are the signs or symptoms? °Common symptoms of this condition include: °· Shaking (convulsions). °· Stiffness in the body. °· Passing out (losing consciousness). °· Uncontrolled movements in the: °? Arms or legs. °? Eyes. °? Head. °? Mouth. °Some people have symptoms right before a seizure happens. These symptoms may include: °· Fear. °· Worry (anxiety). °· Feeling like you are going to throw up (nausea). °· Feeling like the room is spinning (vertigo). °· Feeling like you saw or heard something before (déjà vu). °· Odd tastes or smells. °· Changes in vision, such as seeing flashing lights or spots. °Follow these instructions at home: °Medicines ° °· Take over-the-counter and prescription medicines only as told by your doctor. °· Do not eat or drink anything that may keep your medicine from working, such as alcohol. °Activity °· Do not do any activities that would be dangerous if you had another seizure, like driving or swimming. Wait until your doctor says it is safe for you to do them. °· If you live in the [place] ask your local DMV (department of motor vehicles) when you can drive. °· Get plenty of rest. °Teaching others ° °· Teach friends and family what to do when you have a seizure. They should: °? Lay you on the ground. °? Protect your head and body. °? Loosen any tight clothing around your neck. °? Turn you on your side. °? Not hold you down. °? Not put anything into your mouth. °? Know whether or not you need emergency care. °? Stay with you until you are better. °General instructions °· Contact your doctor each time you have a seizure. °· Avoid anything that gives you seizures. °· Keep a seizure diary. Write down: °? What  you think caused each seizure. °? What you remember about each seizure. °· Keep all follow-up visits as told by your doctor. This is important. °Contact a doctor if: °· You have another seizure. °· You have seizures more often. °· There is any change in what happens during your seizures. °· You keep having seizures with treatment. °· You have symptoms of being sick or having an infection. °Get help right away if: °· You have a seizure: °? That lasts longer than 5 minutes. °? That is different than seizures you had before. °? That makes it harder to breathe. °? After you hurt your head. °· After a seizure, you cannot speak or use a part of your body. °· After a seizure, you are confused or have a bad headache. °· You have two or more seizures in a row. °· You are having seizures more often. °· You do not wake up right after a seizure. °· You get hurt during a seizure. °In an emergency: °· These symptoms may be an emergency. Do not wait to see if the symptoms will go away. Get medical help right away. Call your local emergency services (911 in the [place] Do not drive yourself to the hospital. °Summary °· Seizures usually last from 30 seconds to 2 minutes. Usually, they are not harmful unless they last a long time. °· Do not eat or drink anything that may keep your medicine from working, such as alcohol. °·   Teach friends and family what to do when you have a seizure. °· Contact your doctor each time you have a seizure. °This information is not intended to replace advice given to you by your health care provider. Make sure you discuss any questions you have with your health care provider. °Document Released: 12/21/2007 Document Revised: 03/28/2018 Document Reviewed: 08/10/2017 °Elsevier Interactive Patient Education © 2019 [employer] ° °

## 2018-12-13 NOTE — Consult Note (Signed)
   [facility] CM Inpatient Consult   12/13/2018  CABOT CROMARTIE [birth year] [id number]    Follow-up note:  Following patient's disposition, this patient will be admitting to skilled nursing facility (SNF) per [facility] LCSW note. Patient's daughter and son had selected [facility] and CSW confirmed that they can accept patient today and are able to transport patient to dialysis.  [facility] PAC (post-acute Associate Professor) was notified of patient's disposition for follow-up.  For questions and additional information, please contact:  Newell Frater A. Aniyia Rane, BSN, RN-BC [facility] Liaison Cell: [phone]

## 2018-12-13 NOTE — TOC Transition Note (Addendum)
Transition of Care [facility]) - CM/SW Discharge Note   Patient Details  Name: Bruce Cole MRN: [id number] Date of Birth: 1940/10/03  [facility]) CM/SW Contact:  Benard Halsted, LCSW Phone Number: 12/13/2018, 12:51 PM   Clinical Narrative:    Patient will DC to: [facility] Anticipated DC date: 12/13/18 Family notified: Daughter, [name], and son Transport by: [facility] 4:30pm   Per MD patient ready for DC to [facility]. RN, patient, patient's family, and facility notified of DC. Discharge Summary and FL2 sent to facility. RN to call report prior to discharge [phone] Room 3225).   CSW will sign off for now as social work intervention is no longer needed. Please consult [place] again if new needs arise.  Cedric Fishman, LCSW Clinical Social Worker [phone]      Barriers to Discharge: Continued Medical Work up   Patient Goals and CMS Choice Patient states their goals for this hospitalization and ongoing recovery are:: to get stronger CMS Medicare.gov Compare Post Acute Care list provided to:: Patient Choice offered to / list presented to : Patient  Discharge Placement                       Discharge Plan and Services     Post Acute Care Choice: [place]                               Social Determinants of Health (SDOH) Interventions     Readmission Risk Interventions No flowsheet data found.

## 2018-12-13 NOTE — TOC Progression Note (Signed)
Transition of Care [facility]) - Progression Note    Patient Details  Name: Bruce Cole MRN: [id number] Date of Birth: Oct 19, 1940  Transition [facility] Care [facility]) CM/SW [place], [place] Phone Number: 12/13/2018, 12:27 PM  Clinical Narrative:    CSW spoke with patient's daughter and son by phone with permission from patient. CSW presented SNF bed offers with Medicare Ratings. They have selected [facility]. CSW confirmed that they can accept patient today and are able to transport patient to dialysis. Daughter, [name], will sign paperwork there at 1:30pm. She and son will pick patient up be car once son gets off of work this afternoon. CSW instructed them to call the nursing station when they are ready to pick patient up. CSW faxing [facility] requested vitals info.    Expected Discharge Plan: [place] Barriers to Discharge: Continued Medical Work up  Expected Discharge Plan and Services Expected Discharge Plan: [place] Choice: [place] arrangements for the past 2 months: Single Family Home Expected Discharge Date: 12/13/18                                     Social Determinants of Health (SDOH) Interventions    Readmission Risk Interventions No flowsheet data found.

## 2018-12-13 NOTE — Progress Notes (Signed)
ANTICOAGULATION CONSULT NOTE - Initial Consult  Pharmacy Consult for warfarin Indication: atrial fibrillation  Allergies  Allergen Reactions  . Ace Inhibitors Other (See Comments)    Worsening renal insufficiency    Patient Measurements:    Vital Signs: Temp: 98.5 F (36.9 C) (05/28 0742) Temp Source: Oral (05/28 0742) BP: 93/67 (05/28 0742) Pulse Rate: 57 (05/28 0742)  Labs: Recent Labs    12/11/18 0722 12/11/18 0727 12/12/18 0348 12/12/18 1330 12/13/18 0358  HGB  --  9.7*  --  9.6*  --   HCT  --  29.8*  --  30.0*  --   PLT  --  190  --  186  --   LABPROT 31.9*  --  31.9*  --  28.5*  INR 3.2*  --  3.2*  --  2.7*  CREATININE  --  5.17*  --  7.44*  --     Estimated Creatinine Clearance: 11.6 mL/min (A) (by C-G formula based on SCr of 7.44 mg/dL (H)).  Assessment: 52 yoM admitted with seizures s/p fall with small hematoma to back of head. Pt on warfarin PTA for hx AFib which was held on admission due to possible need for LP which is not needed anymore from neuro standpoint. Last dose of warfarin was 5/23. INR 2.7 today  *PTA Dose = 5mg  daily  Goal of Therapy:  INR 2-3 Monitor platelets by anticoagulation protocol: Yes   Plan:  Warfarin 2.5 mg x 1 Daily INR  Levester Fresh, PharmD, BCPS, BCCCP Clinical Pharmacist [phone]  Please check AMION for all [place] numbers  12/13/2018 9:35 AM

## 2018-12-13 NOTE — Discharge Summary (Signed)
Physician Discharge Summary  Bruce Cole IRS:[id number] DOB: 1940/10/14 DOA: 12/09/2018  PCP: Debbrah Alar, NP  Admit date: 12/09/2018 Discharge date: 12/13/2018  Time spent: 45 minutes  Recommendations for Outpatient Follow-up:  Patient will be discharged to skilled nursing facility, continue physical and occupational therapy.  Patient will need to follow up with primary care provider within one week of discharge.  Continue hemodialysis as scheduled. Follow up with neurology. Patient should continue medications as prescribed.  Patient should follow a renal diet.   Discharge Diagnoses:  Seizure Herpes zoster Atrial fibrillation Chronic combined CHF Hypotension Diabetes mellitus, type II Large right submandibular calculus  Right lower extremity erythema-possible cellulitis  Discharge Condition: Stable  Diet recommendation: Renal diet  There were no vitals filed for this visit.  History of present illness:  On 12/09/2018 by Dr. [name] a 78 y.o.malewith medical history significant ofRA; OSA; nephrolithiasis; HTN; ESRD on TTS HD; afib on Coumadin; and recent diagnosis with seizures; chronic combined CHF presenting with breakthrough seizures.  Upon my entering the room, the patient was again very somnolent and confused. However, within a few minutes, he became more alert and conversant and was oriented and able to answer questions. He reports "a while" of generalized tremors after which he becomes confused and disoriented. He also reports "herpes" on his left buttock with intermittent use of Valtrex and not recently. It is painful.  Patient was seen at [facility] on 4/28 for a fall but did not appear to have significant injury and did not hit his head. He had a virtual visit with his PCP on 5/19 for "tremors" starting after the 4/28 fall. He was referred to neurology and had a CT ordered to r/o SDH. He went to the ER a few hours later because  he fell out of bed during one of these tremors. While in the ER, he had a witnessed episode of R arm twitching and then was confused for a few minutes after. Dr. Rory Percy was consulted by telephone and recommended initiation of Keppra and outpatient neurology f/u. He returned to the ER on 5/20 after developing slurred speech and AMS during HD. This was thought to be associated with hypotension during HD. Admission was recommended but he was "adamant that he is going home today and that he never should have been brought here." He returned to the ER on the following day, 5/21, with epistaxis; labs were stable and he was discharged. He had a virtual visit with his PCP on 5/22. The son "is concerned that he can no longer be alone and they are working on getting him additional help and or considering placement." She felt that his seizures were the primary contributing factor to his falls and confusion. Of note, he was seen on 11/22 for a "tender scabbed area on right lateral buttock". This was thought to be resolving herpes zoster. He was already on Neurontin (also tried Lyrica) and was ordered capsaicin cream; she later ordered lidoderm patches. The patient thought that this was a flare up of HSV2 according to a 2/12 telephone call and she prescribed daily Valtrex that day.  Per his son, episodes were every few hours since Tuesday. They have been escalating in time and now seem to rapidly fluctuate between altered and normal.  Hospital Course:  Seizure -Patient with recent diagnosis of seizure started on Keppra on 12/07/2018 -Seizure-like activity thought to be secondary to metabolic encephalopathy (anemia) versus postictal state  -EEG consistent with generalized nonspecific cerebral dysfunction,  encephalopathy.  No seizure or seizure disposition recorded on study -CT head showed no acute intracranial normality -MRI brain showed no acute intracranial normality.  Chronic left cerebellar infarct  and minimal chronic small vessel ischemic disease and cerebral white matter -Neurology consulted and appreciated, no LP or need for Dilantin -Currently on acyclovir for possible encephalitis  -Vitamin B12 1105, RPR nonreactive, TSH 1.2, vitamin B1 pending -of note patient did have some muscle jerking, gabapentin was discontinued and jerking improved -Discussed Keppra dosing with neurology, Dr. Cheral Marker. Patient to take 500mg  daily, with 250mg  after dialysis days  Herpes zoster -Noted on buttocks -Dermatomal ulcerations in various stages of healing along the left buttocks -Continue oral acyclovir  Atrial fibrillation -Chronic -Continue Coumadin  Chronic combined CHF -Continue vomiting control with hemodialysis  Hypotension -Continue Midodrine  Diabetes mellitus, type II -Last hemoglobin A1c 6.2 in 2019 -diet controlled?  Large right submandibular calculus  -Incidental finding, patient will need to follow-up as an outpatient  Right lower extremity erythema-possible cellulitis -Patient started on doxycycline  Procedures: EEG  Consultations: Nephrology Neurology  Discharge Exam: Vitals:   12/13/18 0417 12/13/18 0742  BP: (!) 99/56 93/67  Pulse: (!) 55 (!) 57  Resp: 18   Temp: 98.6 F (37 C) 98.5 F (36.9 C)  SpO2: 98% 98%     General: Well developed, well nourished, NAD, appears stated age  HEENT: NCAT, mucous membranes moist.  Cardiovascular: S1 S2 auscultated, soft SEM  Respiratory: Diminished but clear to auscultation bilaterally   Abdomen: Soft, nontender, nondistended, + bowel sounds  Extremities: warm dry without cyanosis clubbing. +1 LE edema  Neuro: AAOx3, nonfocal  Psych: Normal affect and demeanor  Discharge Instructions Discharge Instructions    Discharge instructions   Complete by:  As directed    Patient will be discharged to skilled nursing facility, continue physical and occupational therapy.  Patient will need to follow up with  primary care provider within one week of discharge.  Continue hemodialysis as scheduled. Follow up with neurology. Patient should continue medications as prescribed.  Patient should follow a renal diet.     Allergies as of 12/13/2018      Reactions   Ace Inhibitors Other (See Comments)   Worsening renal insufficiency      Medication List    STOP taking these medications   acetaminophen 500 MG tablet Commonly known as:  TYLENOL   furosemide 40 MG tablet Commonly known as:  LASIX   gabapentin 100 MG capsule Commonly known as:  NEURONTIN   traMADol 50 MG tablet Commonly known as:  ULTRAM   valACYclovir 500 MG tablet Commonly known as:  VALTREX     TAKE these medications   acyclovir 400 MG tablet Commonly known as:  ZOVIRAX Take 0.5 tablets (200 mg total) by mouth 2 (two) times daily for 5 days.   atorvastatin 10 MG tablet Commonly known as:  LIPITOR TAKE 1 TABLET(10 MG) BY MOUTH DAILY What changed:  See the new instructions.   camphor-menthol lotion Commonly known as:  SARNA Apply 1 application topically every 8 (eight) hours as needed for itching.   colchicine 0.6 MG tablet Take 1/2 tablet by mouth once as needed for gout flare. Call if no improvement   doxycycline 100 MG tablet Commonly known as:  VIBRA-TABS Take 1 tablet (100 mg total) by mouth every 12 (twelve) hours for 3 days.   feeding supplement (NEPRO CARB STEADY) Liqd Take 237 mLs by mouth 3 (three) times daily as needed (Supplement).  ferric citrate 1 GM 210 MG(Fe) tablet Commonly known as:  AURYXIA Take 2 tablets (420 mg total) by mouth 3 (three) times daily with meals.   hydrOXYzine 25 MG tablet Commonly known as:  ATARAX/VISTARIL Take 1 tablet (25 mg total) by mouth every 8 (eight) hours as needed for itching.   levETIRAcetam 250 MG tablet Commonly known as:  KEPPRA Take 250mg  after dialysis sessions. What changed:  You were already taking a medication with the same name, and this prescription  was added. Make sure you understand how and when to take each.   levETIRAcetam 500 MG tablet Commonly known as:  KEPPRA Take 1 tablet (500 mg total) by mouth daily. Start taking on:  Dec 14, 2018 What changed:  when to take this   midodrine 10 MG tablet Commonly known as:  PROAMATINE Take 1 tablet (10 mg total) by mouth every Monday, Wednesday, and Friday with hemodialysis.   midodrine 5 MG tablet Commonly known as:  PROAMATINE Take 1 tablet (5 mg total) by mouth 2 (two) times daily with a meal.   mupirocin cream 2 % Commonly known as:  BACTROBAN Apply topically daily. Apply Bactroban to buttock lesions Q day, may leave open to air Start taking on:  Dec 14, 2018   pantoprazole 40 MG tablet Commonly known as:  PROTONIX Take 1 tablet (40 mg total) by mouth daily.   Rena-Vite Rx 1 MG Tabs Take 1 tablet by mouth at bedtime.   Renvela 800 MG tablet Generic drug:  sevelamer carbonate Take 3 tablets (2,400 mg total) by mouth 3 (three) times daily with meals.   warfarin 2.5 MG tablet Commonly known as:  COUMADIN Take as directed. If you are unsure how to take this medication, talk to your nurse or doctor. Original instructions:  Take 1 tablet (2.5 mg total) by mouth one time only at 6 PM. What changed:    medication strength  how much to take  when to take this      Allergies  Allergen Reactions   Ace Inhibitors Other (See Comments)    Worsening renal insufficiency   Follow-up Information    Debbrah Alar, NP. Schedule an appointment as soon as possible for a visit in 1 week(s).   Specialty:  Internal Medicine Why:  Hospital follow up Contact information: [address] [place] [address] [phone]            The results of significant diagnostics from this hospitalization (including imaging, microbiology, ancillary and laboratory) are listed below for reference.    Significant Diagnostic Studies: Dg Chest 2 View  Result Date:  12/04/2018 CLINICAL DATA:  Pain status post fall EXAM: CHEST - 2 VIEW COMPARISON:  07/16/2016 FINDINGS: The cardiac silhouette is enlarged. There is mild vascular congestion. The left lung base is poorly evaluated. There are degenerative changes of both glenohumeral joints. Vascular calcifications are noted. The lung volumes are low. IMPRESSION: Cardiomegaly with mild vascular congestion. Electronically Signed   By: Constance Holster M.D.   On: 12/04/2018 18:08   Dg Pelvis 1-2 Views  Result Date: 12/04/2018 CLINICAL DATA:  Pain status post fall EXAM: PELVIS - 1-2 VIEW COMPARISON:  CT dated 07/05/2018 FINDINGS: The patient is status post bilateral total hip arthroplasty. The alignment appears near anatomic. There is no displaced fracture. There is no dislocation. The osseous mineralization is decreased. Vascular calcifications are noted. IMPRESSION: No acute displaced fracture or dislocation. Electronically Signed   By: Jamie Kato.D.  On: 12/04/2018 18:09   Ct Head Wo Contrast  Result Date: 12/09/2018 CLINICAL DATA:  Seizure nontraumatic EXAM: CT HEAD WITHOUT CONTRAST CT CERVICAL SPINE WITHOUT CONTRAST TECHNIQUE: Multidetector CT imaging of the head and cervical spine was performed following the standard protocol without intravenous contrast. Multiplanar CT image reconstructions of the cervical spine were also generated. COMPARISON:  CT head 12/04/2018 FINDINGS: CT HEAD FINDINGS Brain: Mild atrophy. Mild chronic ischemic change in the white matter. Chronic infarct left cerebellum unchanged. Negative for acute infarct, hemorrhage, or mass. Vascular: Negative for hyperdense vessel Skull: Negative for skull fracture. Sinuses/Orbits: Mild mucosal edema paranasal sinuses. Negative orbit. Other: None CT CERVICAL SPINE FINDINGS Alignment: Normal Skull base and vertebrae: Negative for fracture Soft tissues and spinal canal: Extensive atherosclerotic calcification Large right submandibular calculus  measuring 11 x 17 mm. No soft tissue mass. Disc levels: Disc and facet degeneration throughout the cervical spine causing spinal and foraminal stenosis at multiple levels. Upper chest: Negative Other: None IMPRESSION: 1. No acute intracranial abnormality. Chronic microvascular ischemic change in the white matter. Chronic infarct left cerebellum 2. Negative for cervical spine fracture 3. Large right submandibular calculus 11 x 17 mm. 4. Atherosclerotic disease. Electronically Signed   By: Franchot Gallo M.D.   On: 12/09/2018 09:39   Ct Head Wo Contrast  Result Date: 12/04/2018 CLINICAL DATA:  Tremors EXAM: CT HEAD WITHOUT CONTRAST TECHNIQUE: Contiguous axial images were obtained from the base of the skull through the vertex without intravenous contrast. COMPARISON:  12/05/2012 FINDINGS: Brain: No evidence of acute infarction, hemorrhage, hydrocephalus, extra-axial collection or mass lesion/mass effect. There is mild age related volume loss. 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 Vascular: No hyperdense vessel or unexpected calcification. Skull: Normal. Negative for fracture or focal lesion. Sinuses/Orbits: There is a small amount of fluid in the mastoid air cells. Other: None. IMPRESSION: No acute intracranial abnormality detected. Electronically Signed   By: Constance Holster M.D.   On: 12/04/2018 18:06   Ct Cervical Spine Wo Contrast  Result Date: 12/09/2018 CLINICAL DATA:  Seizure nontraumatic EXAM: CT HEAD WITHOUT CONTRAST CT CERVICAL SPINE WITHOUT CONTRAST TECHNIQUE: Multidetector CT imaging of the head and cervical spine was performed following the standard protocol without intravenous contrast. Multiplanar CT image reconstructions of the cervical spine were also generated. COMPARISON:  CT head 12/04/2018 FINDINGS: CT HEAD FINDINGS Brain: Mild atrophy. Mild chronic ischemic change in the white matter. Chronic infarct left cerebellum unchanged. Negative for acute infarct,  hemorrhage, or mass. Vascular: Negative for hyperdense vessel Skull: Negative for skull fracture. Sinuses/Orbits: Mild mucosal edema paranasal sinuses. Negative orbit. Other: None CT CERVICAL SPINE FINDINGS Alignment: Normal Skull base and vertebrae: Negative for fracture Soft tissues and spinal canal: Extensive atherosclerotic calcification Large right submandibular calculus measuring 11 x 17 mm. No soft tissue mass. Disc levels: Disc and facet degeneration throughout the cervical spine causing spinal and foraminal stenosis at multiple levels. Upper chest: Negative Other: None IMPRESSION: 1. No acute intracranial abnormality. Chronic microvascular ischemic change in the white matter. Chronic infarct left cerebellum 2. Negative for cervical spine fracture 3. Large right submandibular calculus 11 x 17 mm. 4. Atherosclerotic disease. Electronically Signed   By: Franchot Gallo M.D.   On: 12/09/2018 09:39   Ct Pelvis W Contrast  Result Date: 12/09/2018 CLINICAL DATA:  Pain following fall EXAM: CT PELVIS WITH CONTRAST TECHNIQUE: Multidetector CT imaging of the pelvis was performed using the standard protocol following the bolus administration of intravenous contrast. CONTRAST:  140mL  OMNIPAQUE IOHEXOL 300 MG/ML  SOLN COMPARISON:  Pelvis radiographs Dec 04, 2018; CT abdomen and pelvis July 05, 2018 FINDINGS: Urinary Tract: There is extensive pelvic susceptibility artifact due to bilateral hip prostheses. Urinary bladder appears empty. There are several renal cysts noted bilaterally, largest measuring approximately 3 x 2 cm. No visualized hydronephrosis a period no ureteral calculi evident. No calculi in either visualized kidney. Bowel: Visualized bowel appears unremarkable without demonstrable obstruction or bowel wall thickening. Vascular/Lymphatic: There is distal aortic and iliac artery atherosclerosis. There is also calcification in each common femoral artery. No aneurysm evident. No lower abdominal or pelvic  adenopathy evident. Reproductive: There are prostatic calculi. Prostate and seminal vesicles appear normal in size and contour. Other: There is no abscess or ascites in the lower abdomen or pelvis. Appendix appears unremarkable. There is mild soft tissue edema in the posterolateral abdominal wall on either side. No soft tissue air or abscess noted in the lower abdominal or pelvic wall regions. Musculoskeletal: There are total hip replacements bilaterally with prosthetic components well-seated. Bones appear osteoporotic. No fracture or dislocation is evident. There is extensive disc space narrowing in the visualized lower lumbar spine. IMPRESSION: 1. Status post total hip replacements bilaterally with prosthetic components well-seated. No acute fracture or dislocation is demonstrable on this study. Bones appear osteoporotic. There is advanced arthropathy in the visualized lower lumbar spine. 2. Mild soft tissue edema in the posterolateral gluteal regions. No abscess or air noted in these areas. No fluid collections noted in the lower abdominal or pelvic wall regions. 3. Several renal cysts noted. No hydronephrosis. No ureteral calculus. Urinary bladder appears empty. There are prostatic calculi. 4.  There is aortic and iliac artery atherosclerosis. 4. Visualized bowel appears unremarkable. No abscess in the lower abdomen or pelvis. Appendix appears unremarkable. Electronically Signed   By: Lowella Grip III M.D.   On: 12/09/2018 09:53   Mr Brain Wo Contrast  Result Date: 12/09/2018 CLINICAL DATA:  Seizures. One-week history of tremors and confusion. Encephalopathy, possibly HSV encephalitis or postictal. EXAM: MRI HEAD WITHOUT CONTRAST TECHNIQUE: Multiplanar, multiecho pulse sequences of the brain and surrounding structures were obtained without intravenous contrast. COMPARISON:  Head CT 12/09/2018 FINDINGS: Brain: The study is mildly motion degraded, including thin section imaging through the temporal lobes.  The hippocampi are grossly symmetric in volume without focal signal abnormality identified. There is no evidence of acute infarct, intracranial hemorrhage, mass, midline shift, or extra-axial fluid collection. Scattered cerebral white matter T2 hyperintensities are nonspecific but compatible with minimal chronic small vessel ischemic disease. A small chronic infarct is again noted laterally in the left cerebellar hemisphere. There is mild generalized cerebral atrophy. Vascular: The distal right vertebral artery is poorly visualized and may be congenitally hypoplastic. Other major intracranial vascular flow voids are preserved. Skull and upper cervical spine: Unremarkable bone marrow signal. Sinuses/Orbits: Unremarkable orbits. Subcentimeter right maxillary sinus mucous retention cyst. Small right and moderate left mastoid effusions. Other: None. IMPRESSION: 1. No acute intracranial abnormality. 2. Chronic left cerebellar infarct and minimal chronic small vessel ischemic disease in the cerebral white matter. Electronically Signed   By: Logan Bores M.D.   On: 12/09/2018 14:17   Dg Chest Portable 1 View  Result Date: 12/09/2018 CLINICAL DATA:  Unwitnessed fall.  Probable seizure EXAM: PORTABLE CHEST 1 VIEW COMPARISON:  Dec 04, 2018 FINDINGS: A portion of the lateral most aspect of the left base is not visualized. Visualized lungs show no evident edema or consolidation. There is stable cardiomegaly with  pulmonary vascularity normal. No adenopathy. There is aortic atherosclerosis. No evident pneumothorax. Bony structures which are visualized appear unremarkable. IMPRESSION: No edema or consolidation. Note that a portion of the lateral left base is not visualized. Stable cardiomegaly. No pneumothorax. Aortic Atherosclerosis (ICD10-I70.0). Electronically Signed   By: Lowella Grip III M.D.   On: 12/09/2018 08:26   Dg Hip Unilat W Or Wo Pelvis 2-3 Views Left  Result Date: 11/13/2018 CLINICAL DATA:  78 year old  male with hip pain after fall. EXAM: DG HIP (WITH OR WITHOUT PELVIS) 2-3V LEFT COMPARISON:  CT Abdomen and Pelvis 07/05/2018. Right hip series 06/12/2018. FINDINGS: Bilateral total hip arthroplasty. The bilateral hardware appears normally aligned in the AP view. Frogleg lateral views of the left hip demonstrate normal hardware alignment. No hardware failure identified. The entire right femoral component is not included. Osteopenia. No acute fracture of the pelvis or proximal left femur identified. Calcified atherosclerosis. Negative visible bowel gas pattern. IMPRESSION: 1. No acute fracture or dislocation identified about the left hip or pelvis. 2. Visible hardware intact. Electronically Signed   By: Genevie Ann M.D.   On: 11/13/2018 19:25    Microbiology: Recent Results (from the past 240 hour(s))  SARS Coronavirus 2 (CEPHEID - Performed in [place] hospital lab), Hosp Order     Status: None   Collection Time: 12/09/18  8:12 AM  Result Value Ref Range Status   SARS Coronavirus 2 NEGATIVE NEGATIVE Final    Comment: (NOTE) If result is NEGATIVE SARS-CoV-2 target nucleic acids are NOT DETECTED. The SARS-CoV-2 RNA is generally detectable in upper and lower  respiratory specimens during the acute phase of infection. The lowest  concentration of SARS-CoV-2 viral copies this assay can detect is 250  copies / mL. A negative result does not preclude SARS-CoV-2 infection  and should not be used as the sole basis for treatment or other  patient management decisions.  A negative result may occur with  improper specimen collection / handling, submission of specimen other  than nasopharyngeal swab, presence of viral mutation(s) within the  areas targeted by this assay, and inadequate number of viral copies  (<250 copies / mL). A negative result must be combined with clinical  observations, patient history, and epidemiological information. If result is POSITIVE SARS-CoV-2 target nucleic acids are  DETECTED. The SARS-CoV-2 RNA is generally detectable in upper and lower  respiratory specimens dur ing the acute phase of infection.  Positive  results are indicative of active infection with SARS-CoV-2.  Clinical  correlation with patient history and other diagnostic information is  necessary to determine patient infection status.  Positive results do  not rule out bacterial infection or co-infection with other viruses. If result is PRESUMPTIVE POSTIVE SARS-CoV-2 nucleic acids MAY BE PRESENT.   A presumptive positive result was obtained on the submitted specimen  and confirmed on repeat testing.  While 2019 novel coronavirus  (SARS-CoV-2) nucleic acids may be present in the submitted sample  additional confirmatory testing may be necessary for epidemiological  and / or clinical management purposes  to differentiate between  SARS-CoV-2 and other Sarbecovirus currently known to infect humans.  If clinically indicated additional testing with an alternate test  methodology [phone]) is advised. The SARS-CoV-2 RNA is generally  detectable in upper and lower respiratory sp ecimens during the acute  phase of infection. The expected result is Negative. Fact Sheet for Patients:  StrictlyIdeas.no Fact Sheet for Healthcare Providers: BankingDealers.co.za This test is not yet approved or cleared by the [place]  States FDA and has been authorized for detection and/or diagnosis of SARS-CoV-2 by FDA under an Emergency Use Authorization (EUA).  This EUA will remain in effect (meaning this test can be used) for the duration of the COVID-19 declaration under Section 564(b)(1) of the Act, 21 U.S.C. section 360bbb-3(b)(1), unless the authorization is terminated or revoked sooner. Performed at [facility], [place] [address] [place], [place] [address]      Labs: Basic Metabolic Panel: Recent Labs  Lab 12/06/18 1150 12/09/18 0728 12/10/18 0355  12/11/18 0727 12/12/18 1330  NA 137 138 138 135 131*  K 3.5 3.9 4.6 3.7 4.1  CL 97* 95* 97* 96* 93*  CO2 29 25 26 27 25   GLUCOSE 144* 146* 108* 104* 128*  BUN 30* 40* 49* 22 37*  CREATININE 6.24* 6.97* 8.20* 5.17* 7.44*  CALCIUM 10.2 10.5* 9.8 8.9 9.3  MG  --  2.8*  --   --   --   PHOS  --   --   --   --  3.6   Liver Function Tests: Recent Labs  Lab 12/09/18 0728 12/10/18 0355 12/12/18 1330  AST 25 18  --   ALT 15 15  --   ALKPHOS 282* 265*  --   BILITOT 0.6 0.2*  --   PROT 8.0 7.0  --   ALBUMIN 3.1* 2.8* 2.8*   No results for input(s): LIPASE, AMYLASE in the last 168 hours. Recent Labs  Lab 12/09/18 1628  AMMONIA 45*   CBC: Recent Labs  Lab 12/06/18 1150 12/09/18 0728 12/10/18 0355 12/11/18 0727 12/12/18 1330  WBC 8.1 8.8 7.6 8.7 8.1  NEUTROABS  --  5.8  --   --   --   HGB 9.8* 10.2* 9.4* 9.7* 9.6*  HCT 32.0* 32.5* 29.6* 29.8* 30.0*  MCV 100.0 97.0 96.4 95.5 96.8  PLT 234 222 194 190 186   Cardiac Enzymes: Recent Labs  Lab 12/09/18 0728 12/09/18 1628 12/09/18 2303 12/10/18 0355  TROPONINI 0.08* 0.06* 0.07* 0.07*   BNP: BNP (last 3 results) No results for input(s): BNP in the last 8760 hours.  ProBNP (last 3 results) No results for input(s): PROBNP in the last 8760 hours.  CBG: Recent Labs  Lab 12/09/18 0757  GLUCAP 124*       Signed:  Kyaire Gruenewald  [facility] 12/13/2018, 10:54 AM

## 2018-12-13 NOTE — Progress Notes (Signed)
Occupational Therapy Treatment Patient Details Name: Bruce Cole MRN: [id number] DOB: 12/23/1940 Today's Date: 12/13/2018    History of present illness Bruce Cole is a 78 y.o. male with PMH of RA; OSA; nephrolithiasis; HTN; ESRD on TTS HD; afib on Coumadin; and recent diagnosis with seizures. 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    OT comments  Pt sleeping in bed upon therapy arrival and once awake agreeable to participate in OT treatment session. Patient being discharged to SNF this afternoon and completed bedside ADL activity. Patient selected his street clothes from bag and completed bathing and dressing while seated on EOB. Patient was able to use RW to complete sit to stand and Mod assist to pull his pants up over his hips. Once completed he was able to scoot towards the head of the bed prior to laying supine. Patient has met all therapy goals or is adequate for discharge. He will be discharged from therapy services and continue therapy at next venue of care.            Precautions / Restrictions Precautions Precautions: Fall Restrictions Weight Bearing Restrictions: No       Mobility Bed Mobility Overal bed mobility: Needs Assistance Bed Mobility: Sit to Supine       Sit to supine: HOB elevated;Min assist      Transfers Overall transfer level: Needs assistance Equipment used: Rolling walker (2 wheeled)   Sit to Stand: Min assist;From elevated surface              Balance Overall balance assessment: Needs assistance Sitting-balance support: Feet supported Sitting balance-Leahy Scale: Good     Standing balance support: Single extremity supported;During functional activity Standing balance-Leahy Scale: Poor        ADL either performed or assessed with clinical judgement   ADL Overall ADL's : Needs assistance/impaired     Grooming: Wash/dry face;Set up;Bed level   Upper Body Bathing: Set up;Sitting   Lower Body Bathing: Minimal  assistance;Sit to/from stand   Upper Body Dressing : Minimal assistance;Sitting   Lower Body Dressing: Minimal assistance;Sit to/from stand         Vision Baseline Vision/History: Wears glasses Wears Glasses: Reading only Patient Visual Report: No change from baseline            Cognition Arousal/Alertness: Awake/alert Behavior During Therapy: WFL for tasks assessed/performed Overall Cognitive Status: History of cognitive impairments - at baseline                       Pertinent Vitals/ Pain       Pain Assessment: No/denies pain      Progress Toward Goals  OT Goals(current goals can now be found in the care plan section)  Progress towards OT goals: Goals met/education completed, patient discharged from OT(Patient discharging to SNF today)     Plan All goals met and education completed, patient discharged from OT services       AM-PAC OT "6 Clicks" Daily Activity     Outcome Measure   Help from another person eating meals?: A Little Help from another person taking care of personal grooming?: A Little Help from another person toileting, which includes using toliet, bedpan, or urinal?: A Little Help from another person bathing (including washing, rinsing, drying)?: A Little Help from another person to put on and taking off regular upper body clothing?: A Little Help from another person to put on and taking off regular lower body clothing?: A Little 6  Click Score: 18    End of Session Equipment Utilized During Treatment: Gait belt;Rolling walker  OT Visit Diagnosis: Unsteadiness on feet (R26.81);History of falling (Z91.81)   Activity Tolerance Patient tolerated treatment well   Patient Left in bed;with call bell/phone within reach;with bed alarm set   Nurse Communication Mobility status        Time: 1438-1510 OT Time Calculation (min): 32 min  Charges: OT General Charges $OT Visit: 1 Visit OT Treatments $Self Care/Home Management : 23-37  mins(32[place], OTR/L,CBIS  [phone]    Emani Taussig, Clarene Duke 12/13/2018, 3:16 PM

## 2018-12-13 NOTE — Progress Notes (Addendum)
Due to increasing frequency of episodic altered mentation/sensation reported by the son to Dr. Lorin Mercy on 5/24, intermittent partial complex seizures were a consideration. However, the initial EEG did not show electrographic seizure, being consistent with an encephalopathy.   Electronically signed: Dr. Kerney Elbe

## 2018-12-13 NOTE — Progress Notes (Addendum)
Subjective: No complaints. Improved mentation today. Nephrology has stopped Neurontin. Asterixis essentially resolved today.   Objective: Current vital signs: BP 93/67 (BP Location: Right Arm)   Pulse (!) 57   Temp 98.5 F (36.9 C) (Oral)   Resp 18   SpO2 98%  Vital signs in last 24 hours: Temp:  [97.4 F (36.3 C)-98.6 F (37 C)] 98.5 F (36.9 C) (05/28 0742) Pulse Rate:  [24-60] 57 (05/28 0742) Resp:  [15-27] 18 (05/28 0417) BP: (87-103)/(22-67) 93/67 (05/28 0742) SpO2:  [95 %-100 %] 98 % (05/28 0742)  Intake/Output from previous day: 05/27 0701 - 05/28 0700 In: 120 [P.O.:120] Out: 3000  Intake/Output this shift: No intake/output data recorded. Nutritional status:  Diet Order            Diet renal with fluid restriction Fluid restriction: 1200 mL Fluid; Room service appropriate? No; Fluid consistency: Thin  Diet effective now             HEENT: [place]/AT Lungs: Respirations unlabored Ext: Warm and well perfused  Neurologic Exam: Ment: Alert and oriented x 5. Speech fluent with intact comprehension. Will converse normally, but did have one episode of tangential speech. 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 When asked who he liked hanging out with the most, he states Joe Namath.  CN: EOMI. Fixates and tracks normally. Face symmetric.  Motor: 5/5 x 4.  Cerebellar: No ataxia with FNF bilaterally  Lab Results: Results for orders placed or performed during the hospital encounter of 12/09/18 (from the past 48 hour(s))  Protime-INR     Status: Abnormal   Collection Time: 12/12/18  3:48 AM  Result Value Ref Range   Prothrombin Time 31.9 (H) 11.4 - 15.2 seconds   INR 3.2 (H) 0.8 - 1.2    Comment: (NOTE) INR goal varies based on device and disease states. Performed at [facility], [place] [address] [place], [place] [address]   CBC     Status: Abnormal   Collection Time: 12/12/18  1:30 PM  Result Value Ref  Range   WBC 8.1 4.0 - 10.5 K/uL   RBC 3.10 (L) 4.22 - 5.81 MIL/uL   Hemoglobin 9.6 (L) 13.0 - 17.0 g/dL   HCT 30.0 (L) 39.0 - 52.0 %   MCV 96.8 80.0 - 100.0 fL   MCH 31.0 26.0 - 34.0 pg   MCHC 32.0 30.0 - 36.0 g/dL   RDW 18.9 (H) 11.5 - 15.5 %   Platelets 186 150 - 400 K/uL   nRBC 0.2 0.0 - 0.2 %    Comment: Performed at [facility] [address] [place], [place] [address]  Renal function panel     Status: Abnormal   Collection Time: 12/12/18  1:30 PM  Result Value Ref Range   Sodium 131 (L) 135 - 145 mmol/L   Potassium 4.1 3.5 - 5.1 mmol/L   Chloride 93 (L) 98 - 111 mmol/L   CO2 25 22 - 32 mmol/L   Glucose, Bld 128 (H) 70 - 99 mg/dL   BUN 37 (H) 8 - 23 mg/dL   Creatinine, Ser 7.44 (H) 0.61 - 1.24 mg/dL   Calcium 9.3 8.9 - 10.3 mg/dL   Phosphorus 3.6 2.5 - 4.6 mg/dL   Albumin 2.8 (L) 3.5 - 5.0 g/dL   GFR calc non Af Amer 6 (L) >60 mL/min   GFR calc Af Amer 8 (L) >60 mL/min  Anion gap 13 5 - 15    Comment: Performed at [facility] [address], [place], [place] [address]  Protime-INR     Status: Abnormal   Collection Time: 12/13/18  3:58 AM  Result Value Ref Range   Prothrombin Time 28.5 (H) 11.4 - 15.2 seconds   INR 2.7 (H) 0.8 - 1.2    Comment: (NOTE) INR goal varies based on device and disease states. Performed at [facility], [place] [address], [place], [place] [address]     Recent Results (from the past 240 hour(s))  SARS Coronavirus 2 (CEPHEID - Performed in [place] hospital lab), Hosp Order     Status: None   Collection Time: 12/09/18  8:12 AM  Result Value Ref Range Status   SARS Coronavirus 2 NEGATIVE NEGATIVE Final    Comment: (NOTE) If result is NEGATIVE SARS-CoV-2 target nucleic acids are NOT DETECTED. The SARS-CoV-2 RNA is generally detectable in upper and lower  respiratory specimens during the acute phase of infection. The lowest  concentration of SARS-CoV-2 viral copies this assay can detect is 250  copies / mL. A  negative result does not preclude SARS-CoV-2 infection  and should not be used as the sole basis for treatment or other  patient management decisions.  A negative result may occur with  improper specimen collection / handling, submission of specimen other  than nasopharyngeal swab, presence of viral mutation(s) within the  areas targeted by this assay, and inadequate number of viral copies  (<250 copies / mL). A negative result must be combined with clinical  observations, patient history, and epidemiological information. If result is POSITIVE SARS-CoV-2 target nucleic acids are DETECTED. The SARS-CoV-2 RNA is generally detectable in upper and lower  respiratory specimens dur ing the acute phase of infection.  Positive  results are indicative of active infection with SARS-CoV-2.  Clinical  correlation with patient history and other diagnostic information is  necessary to determine patient infection status.  Positive results do  not rule out bacterial infection or co-infection with other viruses. If result is PRESUMPTIVE POSTIVE SARS-CoV-2 nucleic acids MAY BE PRESENT.   A presumptive positive result was obtained on the submitted specimen  and confirmed on repeat testing.  While 2019 novel coronavirus  (SARS-CoV-2) nucleic acids may be present in the submitted sample  additional confirmatory testing may be necessary for epidemiological  and / or clinical management purposes  to differentiate between  SARS-CoV-2 and other Sarbecovirus currently known to infect humans.  If clinically indicated additional testing with an alternate test  methodology [phone]) is advised. The SARS-CoV-2 RNA is generally  detectable in upper and lower respiratory sp ecimens during the acute  phase of infection. The expected result is Negative. Fact Sheet for Patients:  StrictlyIdeas.no Fact Sheet for Healthcare Providers: BankingDealers.co.za This test is not  yet approved or cleared by the [place] FDA and has been authorized for detection and/or diagnosis of SARS-CoV-2 by FDA under an Emergency Use Authorization (EUA).  This EUA will remain in effect (meaning this test can be used) for the duration of the COVID-19 declaration under Section 564(b)(1) of the Act, 21 U.S.C. section 360bbb-3(b)(1), unless the authorization is terminated or revoked sooner. Performed at [facility], [place] [address], [place], [place] [address]     Lipid Panel No results for input(s): CHOL, TRIG, HDL, CHOLHDL, VLDL, LDLCALC in the last 72 hours.  Studies/Results: No results found.  Medications:  Scheduled: . acyclovir  200 mg Oral BID  . atorvastatin  10 mg Oral Daily  . Chlorhexidine Gluconate Cloth  6 each Topical Q0600  . Chlorhexidine Gluconate Cloth  6 each Topical Q0600  . darbepoetin (ARANESP) injection - DIALYSIS  150 mcg Intravenous Q Wed-HD  . doxycycline  100 mg Oral Q12H  . ferric citrate  420 mg Oral TID WC  . guaiFENesin  600 mg Oral BID  . levETIRAcetam  250 mg Oral Once per day on Tue Thu Sat   And  . levETIRAcetam  500 mg Oral Daily  . midodrine  10 mg Oral Q M,W,F-HD  . midodrine  5 mg Oral BID WC  . multivitamin  1 tablet Oral QHS  . mupirocin cream   Topical Daily  . pantoprazole  40 mg Oral Daily  . sevelamer carbonate  2,400 mg Oral TID WC   Continuous: . ferric gluconate (FERRLECIT/NULECIT) IV Stopped (12/11/18 0330)    Assessment:  78 y.o.malewith PMH of ESRD(MWF HD; last HD session on Saturday due to upcoming holiday), HTN, CHF, Chronic A-fib on Coumadin, and recent seizure diagnosis started on Keppraon 12/07/2018. Patient was brought to [facility] ED via EMS after patient was found on the floor beside his bed this morning. Patient was slightly confused and altered on initial consult exam 5/24, with asterixis also noted. Neurological DDx continues to primarily consist of asterixis secondary to metabolic encephalopathy and  postictal state secondary to new onset seizures. MRI negative for acute signal changes.  1. His mentation is improved today, which may be secondary to dialysis versus discontinuation of Neurontin.  2. Seizure diagnosis. 1 week history of tremors and confusion. Not sure if these are true seizures or asterixis. His asterixis is resolved today, after Neurontin was stopped. Neurontin felt by nephrology to have been the most likely etiology for his asterixis.  3. Encephalopathy, significantly improved. Most likely was a uremic encephalopathy given his increasing BUN/Cr on labs at the time of initial consultation. Postictal state now felt to be less likely as the etiology. EEG was not consistent with ongoing seizures.   4. Patient with history of genital herpes and some buttock lesions concerning for ?HSVper chart review.  5. Vitamin B12 came back elevated at 1105. RPR negative. TSH normal.  6. Vitamin B1 level is pending.   Recommendations: 1. Continue Keppra at 500 mg qd, with 250 mg supplemental dosing after hemodialysis sessions (change has been ordered through Pharmacy). Of note, he was started on Keppra 5/22 due to presumed seizure diagnosis in the setting of confusion and abnormal movements that upon further clinical review may have been secondary to asterixis.  2. The patient most likely has improved Neurologically with changes in dialysis parameters..  3. INR drawn early this AM is 2.7. Would restart Coumadin with pharmacy assistance, unless there is a contraindication from a general medical standpoint.  4. Agree with discontinuation of Neurontin given that asterixis has improved off this medication. .    LOS: 3 days   @Electronically  signed: Dr. Kerney Elbe 12/13/2018  9:00 AM

## 2018-12-13 NOTE — Progress Notes (Signed)
[facility] Progress Note  Subjective: poor memory but more alert today and no jerking of extremities  Vitals:   12/12/18 1951 12/13/18 0032 12/13/18 0417 12/13/18 0742  BP: (!) 99/22 (!) 87/55 (!) 99/56 93/67  Pulse: (!) 56 (!) 57 (!) 55 (!) 57  Resp: 18 19 18    Temp: 98.2 F (36.8 C) 98.3 F (36.8 C) 98.6 F (37 C) 98.5 F (36.9 C)  TempSrc: Oral Oral Oral Oral  SpO2: 97% 98% 98% 98%    Inpatient medications: . acyclovir  200 mg Oral BID  . atorvastatin  10 mg Oral Daily  . Chlorhexidine Gluconate Cloth  6 each Topical Q0600  . Chlorhexidine Gluconate Cloth  6 each Topical Q0600  . darbepoetin (ARANESP) injection - DIALYSIS  150 mcg Intravenous Q Wed-HD  . doxycycline  100 mg Oral Q12H  . ferric citrate  420 mg Oral TID WC  . guaiFENesin  600 mg Oral BID  . levETIRAcetam  250 mg Oral Once per day on Tue Thu Sat   And  . levETIRAcetam  500 mg Oral Daily  . midodrine  10 mg Oral Q M,W,F-HD  . midodrine  5 mg Oral BID WC  . multivitamin  1 tablet Oral QHS  . mupirocin cream   Topical Daily  . pantoprazole  40 mg Oral Daily  . sevelamer carbonate  2,400 mg Oral TID WC  . warfarin  2.5 mg Oral ONCE-1800  . Warfarin - Pharmacist Dosing Inpatient   Does not apply q1800   . ferric gluconate (FERRLECIT/NULECIT) IV Stopped (12/11/18 0330)   acetaminophen **OR** acetaminophen, calcium carbonate (dosed in mg elemental calcium), camphor-menthol **AND** hydrOXYzine, docusate sodium, feeding supplement (NEPRO CARB STEADY), LORazepam, ondansetron **OR** ondansetron (ZOFRAN) IV, sorbitol    Exam: General: obese WDWN NAD alert and Ox 3 Lungs: DIm BS. Breathing is unlabored. Heart: irreg Abdomen: obese soft NT + BS Skin: areas of breakdown on buttock not examined  Lower extremities: L > R LE edema scabbed areas left posterior calf, 1+ bilat edema Neuro: alert, follows commands and carries conversation, doesn't remember yesterday's event though; no asterixis today  L upper AVF + bruit   MWF AF   4h  125.5kg  425/800   P2  2/2.25 bath  LUA AVF  Hep 6000 Mircera 150 5/13  Hectorol 4  Recent labs: hgb 10.5 14% sat ferritin 66 Ca/P ok   Assessment/Plan: 1. Encephalopathy - etiology per neuro felt to be uremia related and not seizures.  Asterixis may be due to Neurontin, common in ESRD pts.  Have dc'd yest and looks better today.  Will list as intolerance, please avoid gabapentin.  2. Recent seizure diagnosis: continues on Keppra but dose lowered per neuro for ESRD.   3. ESRD -  MWF. Had HD yesterday 4. Hypotension/volume  - on midodrine 5 bid  Per outpatient records  may need more -BP drop into 60 - 70s during HD treatments- added 10 pre HD specifically- may contribute to bradycardia though 5. Anemia  -hgb 9.4  review of outpatient labs show chronic low tsat despite IV Fe- on chronic coumadin - tsat  14% sat ferritin 66 - replete Fe check hemocult - continue ESA - due 5/27  6. Metabolic bone disease - pt states is on renvela 2 ac (outpt med list says 3)  and Aurixya 2 ac- no sensipar/continue Hectorol 7. Nutrition - renal diet/vits 8. Afib - INR 3.5  9. Frequent falls - per HD note 5/13 had  been getting home PT-  10. Disp - for dc possibly today    [facility] 12/13/2018, 11:47 AM  Iron/TIBC/Ferritin/ %Sat    Component Value Date/Time   IRON 14 (L) 11/12/2015 0507   TIBC 392 11/12/2015 0507   FERRITIN 18 (L) 11/12/2015 0507   IRONPCTSAT 4 (L) 11/12/2015 0507   Recent Labs  Lab 12/12/18 1330 12/13/18 0358  NA 131*  --   K 4.1  --   CL 93*  --   CO2 25  --   GLUCOSE 128*  --   BUN 37*  --   CREATININE 7.44*  --   CALCIUM 9.3  --   PHOS 3.6  --   ALBUMIN 2.8*  --   INR  --  2.7*   Recent Labs  Lab 12/10/18 0355  AST 18  ALT 15  ALKPHOS 265*  BILITOT 0.2*  PROT 7.0   Recent Labs  Lab 12/12/18 1330  WBC 8.1  HGB 9.6*  HCT 30.0*  PLT 186

## 2018-12-13 NOTE — Progress Notes (Signed)
Renal Navigator notified OP HD clinic/SW of plan for patient's discharge today to provide continuity of care and assist with smooth transition from hospital back to OP HD clinic.  Alphonzo Cruise, [place] Renal Navigator [phone]

## 2018-12-13 NOTE — Care Management Important Message (Signed)
Important Message  Patient Details  Name: Bruce Cole MRN: [id number] Date of Birth: 1940/11/13   Medicare Important Message Given:  Yes    Orbie Pyo 12/13/2018, 2:22 PM

## 2018-12-14 ENCOUNTER — Ambulatory Visit: Payer: Medicare Other | Admitting: Neurology

## 2018-12-14 DIAGNOSIS — D631 Anemia in chronic kidney disease: Secondary | ICD-10-CM | POA: Diagnosis not present

## 2018-12-14 DIAGNOSIS — Z992 Dependence on renal dialysis: Secondary | ICD-10-CM | POA: Diagnosis not present

## 2018-12-14 DIAGNOSIS — N2581 Secondary hyperparathyroidism of renal origin: Secondary | ICD-10-CM | POA: Diagnosis not present

## 2018-12-14 DIAGNOSIS — I4891 Unspecified atrial fibrillation: Secondary | ICD-10-CM | POA: Diagnosis not present

## 2018-12-14 DIAGNOSIS — I5042 Chronic combined systolic (congestive) and diastolic (congestive) heart failure: Secondary | ICD-10-CM | POA: Diagnosis not present

## 2018-12-14 DIAGNOSIS — N186 End stage renal disease: Secondary | ICD-10-CM | POA: Diagnosis not present

## 2018-12-17 DIAGNOSIS — N186 End stage renal disease: Secondary | ICD-10-CM | POA: Diagnosis not present

## 2018-12-17 DIAGNOSIS — I509 Heart failure, unspecified: Secondary | ICD-10-CM | POA: Diagnosis not present

## 2018-12-17 DIAGNOSIS — I10 Essential (primary) hypertension: Secondary | ICD-10-CM | POA: Diagnosis not present

## 2018-12-17 DIAGNOSIS — I158 Other secondary hypertension: Secondary | ICD-10-CM | POA: Diagnosis not present

## 2018-12-17 DIAGNOSIS — I132 Hypertensive heart and chronic kidney disease with heart failure and with stage 5 chronic kidney disease, or end stage renal disease: Secondary | ICD-10-CM | POA: Diagnosis not present

## 2018-12-17 DIAGNOSIS — G9341 Metabolic encephalopathy: Secondary | ICD-10-CM | POA: Diagnosis not present

## 2018-12-17 DIAGNOSIS — D631 Anemia in chronic kidney disease: Secondary | ICD-10-CM | POA: Diagnosis not present

## 2018-12-17 DIAGNOSIS — I482 Chronic atrial fibrillation, unspecified: Secondary | ICD-10-CM | POA: Diagnosis not present

## 2018-12-17 DIAGNOSIS — I4891 Unspecified atrial fibrillation: Secondary | ICD-10-CM | POA: Diagnosis not present

## 2018-12-17 DIAGNOSIS — Z992 Dependence on renal dialysis: Secondary | ICD-10-CM | POA: Diagnosis not present

## 2018-12-17 DIAGNOSIS — G40909 Epilepsy, unspecified, not intractable, without status epilepticus: Secondary | ICD-10-CM | POA: Diagnosis not present

## 2018-12-17 DIAGNOSIS — R296 Repeated falls: Secondary | ICD-10-CM | POA: Diagnosis not present

## 2018-12-17 DIAGNOSIS — E1122 Type 2 diabetes mellitus with diabetic chronic kidney disease: Secondary | ICD-10-CM | POA: Diagnosis not present

## 2018-12-17 DIAGNOSIS — I5042 Chronic combined systolic (congestive) and diastolic (congestive) heart failure: Secondary | ICD-10-CM | POA: Diagnosis not present

## 2018-12-17 DIAGNOSIS — R600 Localized edema: Secondary | ICD-10-CM | POA: Diagnosis not present

## 2018-12-18 ENCOUNTER — Ambulatory Visit: Payer: Medicare Other | Admitting: Orthopaedic Surgery

## 2018-12-19 DIAGNOSIS — G40909 Epilepsy, unspecified, not intractable, without status epilepticus: Secondary | ICD-10-CM | POA: Diagnosis not present

## 2018-12-19 DIAGNOSIS — D631 Anemia in chronic kidney disease: Secondary | ICD-10-CM | POA: Diagnosis not present

## 2018-12-19 DIAGNOSIS — N186 End stage renal disease: Secondary | ICD-10-CM | POA: Diagnosis not present

## 2018-12-19 DIAGNOSIS — I4891 Unspecified atrial fibrillation: Secondary | ICD-10-CM | POA: Diagnosis not present

## 2018-12-19 DIAGNOSIS — I132 Hypertensive heart and chronic kidney disease with heart failure and with stage 5 chronic kidney disease, or end stage renal disease: Secondary | ICD-10-CM | POA: Diagnosis not present

## 2018-12-19 DIAGNOSIS — I5042 Chronic combined systolic (congestive) and diastolic (congestive) heart failure: Secondary | ICD-10-CM | POA: Diagnosis not present

## 2018-12-19 DIAGNOSIS — R600 Localized edema: Secondary | ICD-10-CM | POA: Diagnosis not present

## 2018-12-19 DIAGNOSIS — Z992 Dependence on renal dialysis: Secondary | ICD-10-CM | POA: Diagnosis not present

## 2018-12-21 DIAGNOSIS — D631 Anemia in chronic kidney disease: Secondary | ICD-10-CM | POA: Diagnosis not present

## 2018-12-21 DIAGNOSIS — I132 Hypertensive heart and chronic kidney disease with heart failure and with stage 5 chronic kidney disease, or end stage renal disease: Secondary | ICD-10-CM | POA: Diagnosis not present

## 2018-12-21 DIAGNOSIS — I4891 Unspecified atrial fibrillation: Secondary | ICD-10-CM | POA: Diagnosis not present

## 2018-12-21 DIAGNOSIS — N186 End stage renal disease: Secondary | ICD-10-CM | POA: Diagnosis not present

## 2018-12-21 DIAGNOSIS — I5042 Chronic combined systolic (congestive) and diastolic (congestive) heart failure: Secondary | ICD-10-CM | POA: Diagnosis not present

## 2018-12-21 DIAGNOSIS — Z992 Dependence on renal dialysis: Secondary | ICD-10-CM | POA: Diagnosis not present

## 2018-12-24 DIAGNOSIS — Z992 Dependence on renal dialysis: Secondary | ICD-10-CM | POA: Diagnosis not present

## 2018-12-24 DIAGNOSIS — I4891 Unspecified atrial fibrillation: Secondary | ICD-10-CM | POA: Diagnosis not present

## 2018-12-24 DIAGNOSIS — N186 End stage renal disease: Secondary | ICD-10-CM | POA: Diagnosis not present

## 2018-12-24 DIAGNOSIS — D631 Anemia in chronic kidney disease: Secondary | ICD-10-CM | POA: Diagnosis not present

## 2018-12-24 DIAGNOSIS — I5042 Chronic combined systolic (congestive) and diastolic (congestive) heart failure: Secondary | ICD-10-CM | POA: Diagnosis not present

## 2018-12-24 DIAGNOSIS — I132 Hypertensive heart and chronic kidney disease with heart failure and with stage 5 chronic kidney disease, or end stage renal disease: Secondary | ICD-10-CM | POA: Diagnosis not present

## 2018-12-25 DIAGNOSIS — I5042 Chronic combined systolic (congestive) and diastolic (congestive) heart failure: Secondary | ICD-10-CM | POA: Diagnosis not present

## 2018-12-25 DIAGNOSIS — I482 Chronic atrial fibrillation, unspecified: Secondary | ICD-10-CM | POA: Diagnosis not present

## 2018-12-25 DIAGNOSIS — G40909 Epilepsy, unspecified, not intractable, without status epilepticus: Secondary | ICD-10-CM | POA: Diagnosis not present

## 2018-12-25 DIAGNOSIS — N186 End stage renal disease: Secondary | ICD-10-CM | POA: Diagnosis not present

## 2018-12-26 ENCOUNTER — Other Ambulatory Visit: Payer: Self-pay | Admitting: *Deleted

## 2018-12-26 ENCOUNTER — Ambulatory Visit: Payer: Medicare Other | Admitting: Neurology

## 2018-12-26 DIAGNOSIS — Z992 Dependence on renal dialysis: Secondary | ICD-10-CM | POA: Diagnosis not present

## 2018-12-26 DIAGNOSIS — I4891 Unspecified atrial fibrillation: Secondary | ICD-10-CM | POA: Diagnosis not present

## 2018-12-26 DIAGNOSIS — D631 Anemia in chronic kidney disease: Secondary | ICD-10-CM | POA: Diagnosis not present

## 2018-12-26 DIAGNOSIS — I132 Hypertensive heart and chronic kidney disease with heart failure and with stage 5 chronic kidney disease, or end stage renal disease: Secondary | ICD-10-CM | POA: Diagnosis not present

## 2018-12-26 DIAGNOSIS — N186 End stage renal disease: Secondary | ICD-10-CM | POA: Diagnosis not present

## 2018-12-26 DIAGNOSIS — I5042 Chronic combined systolic (congestive) and diastolic (congestive) heart failure: Secondary | ICD-10-CM | POA: Diagnosis not present

## 2018-12-26 NOTE — Patient Outreach (Signed)
Member assessed for potential [facility] Care Management needs as a benefit of [employer] insurance. Bruce Cole is currently at [facility] SNF receiving rehab therapy.  Bruce Cole has a medical history of ESRD, HD, CHF, afib, DM.  Spoke with [facility] UM RN after the telephonic IDT meeting with [facility] SN[facility]. Member likely to discharge to home next week with daughter.   Discussed that writer will plan to outreach to member to discuss [place] Management program services.     Marthenia Rolling, MSN-Ed, RN,BSN [place] Acute Care Coordinator [phone]

## 2018-12-28 DIAGNOSIS — I132 Hypertensive heart and chronic kidney disease with heart failure and with stage 5 chronic kidney disease, or end stage renal disease: Secondary | ICD-10-CM | POA: Diagnosis not present

## 2018-12-28 DIAGNOSIS — Z992 Dependence on renal dialysis: Secondary | ICD-10-CM | POA: Diagnosis not present

## 2018-12-28 DIAGNOSIS — I4891 Unspecified atrial fibrillation: Secondary | ICD-10-CM | POA: Diagnosis not present

## 2018-12-28 DIAGNOSIS — D631 Anemia in chronic kidney disease: Secondary | ICD-10-CM | POA: Diagnosis not present

## 2018-12-28 DIAGNOSIS — I5042 Chronic combined systolic (congestive) and diastolic (congestive) heart failure: Secondary | ICD-10-CM | POA: Diagnosis not present

## 2018-12-28 DIAGNOSIS — N186 End stage renal disease: Secondary | ICD-10-CM | POA: Diagnosis not present

## 2018-12-29 DIAGNOSIS — D631 Anemia in chronic kidney disease: Secondary | ICD-10-CM | POA: Diagnosis not present

## 2018-12-29 DIAGNOSIS — R296 Repeated falls: Secondary | ICD-10-CM | POA: Diagnosis not present

## 2018-12-29 DIAGNOSIS — I482 Chronic atrial fibrillation, unspecified: Secondary | ICD-10-CM | POA: Diagnosis not present

## 2018-12-29 DIAGNOSIS — G4709 Other insomnia: Secondary | ICD-10-CM | POA: Diagnosis not present

## 2018-12-29 DIAGNOSIS — I5042 Chronic combined systolic (congestive) and diastolic (congestive) heart failure: Secondary | ICD-10-CM | POA: Diagnosis not present

## 2018-12-29 DIAGNOSIS — R600 Localized edema: Secondary | ICD-10-CM | POA: Diagnosis not present

## 2018-12-29 DIAGNOSIS — I9589 Other hypotension: Secondary | ICD-10-CM | POA: Diagnosis not present

## 2018-12-29 DIAGNOSIS — G9349 Other encephalopathy: Secondary | ICD-10-CM | POA: Diagnosis not present

## 2018-12-29 DIAGNOSIS — B029 Zoster without complications: Secondary | ICD-10-CM | POA: Diagnosis not present

## 2018-12-29 DIAGNOSIS — G40909 Epilepsy, unspecified, not intractable, without status epilepticus: Secondary | ICD-10-CM | POA: Diagnosis not present

## 2018-12-29 DIAGNOSIS — E7849 Other hyperlipidemia: Secondary | ICD-10-CM | POA: Diagnosis not present

## 2018-12-29 DIAGNOSIS — N186 End stage renal disease: Secondary | ICD-10-CM | POA: Diagnosis not present

## 2018-12-31 DIAGNOSIS — I132 Hypertensive heart and chronic kidney disease with heart failure and with stage 5 chronic kidney disease, or end stage renal disease: Secondary | ICD-10-CM | POA: Diagnosis not present

## 2018-12-31 DIAGNOSIS — N186 End stage renal disease: Secondary | ICD-10-CM | POA: Diagnosis not present

## 2018-12-31 DIAGNOSIS — Z992 Dependence on renal dialysis: Secondary | ICD-10-CM | POA: Diagnosis not present

## 2018-12-31 DIAGNOSIS — I5042 Chronic combined systolic (congestive) and diastolic (congestive) heart failure: Secondary | ICD-10-CM | POA: Diagnosis not present

## 2018-12-31 DIAGNOSIS — D631 Anemia in chronic kidney disease: Secondary | ICD-10-CM | POA: Diagnosis not present

## 2018-12-31 DIAGNOSIS — I4891 Unspecified atrial fibrillation: Secondary | ICD-10-CM | POA: Diagnosis not present

## 2019-01-01 ENCOUNTER — Other Ambulatory Visit: Payer: Self-pay | Admitting: *Deleted

## 2019-01-01 DIAGNOSIS — G40909 Epilepsy, unspecified, not intractable, without status epilepticus: Secondary | ICD-10-CM | POA: Diagnosis not present

## 2019-01-01 DIAGNOSIS — I482 Chronic atrial fibrillation, unspecified: Secondary | ICD-10-CM | POA: Diagnosis not present

## 2019-01-01 DIAGNOSIS — N186 End stage renal disease: Secondary | ICD-10-CM | POA: Diagnosis not present

## 2019-01-01 DIAGNOSIS — I5042 Chronic combined systolic (congestive) and diastolic (congestive) heart failure: Secondary | ICD-10-CM | POA: Diagnosis not present

## 2019-01-01 NOTE — Patient Outreach (Signed)
Writer following for potential [facility] Care Management services due to [employer].   Attempted to outreach to Bruce Cole at [phone]. No answer unable to leave voicemail message. Also called [phone] listed as mobile in chart. No answer. Left HIPPA compliant voicemail message requesting call back.  Noted member is slated to discharge from [facility] on tomorrow 01/02/19.   Will make another attempt to outreach to Bruce Cole for [place] Management at later time.   Marthenia Rolling, MSN-Ed, RN,BSN [place] Acute Care Coordinator [phone]

## 2019-01-02 ENCOUNTER — Other Ambulatory Visit: Payer: Self-pay | Admitting: *Deleted

## 2019-01-02 DIAGNOSIS — I5042 Chronic combined systolic (congestive) and diastolic (congestive) heart failure: Secondary | ICD-10-CM | POA: Diagnosis not present

## 2019-01-02 DIAGNOSIS — N186 End stage renal disease: Secondary | ICD-10-CM | POA: Diagnosis not present

## 2019-01-02 DIAGNOSIS — I4891 Unspecified atrial fibrillation: Secondary | ICD-10-CM | POA: Diagnosis not present

## 2019-01-02 DIAGNOSIS — D631 Anemia in chronic kidney disease: Secondary | ICD-10-CM | POA: Diagnosis not present

## 2019-01-02 DIAGNOSIS — I132 Hypertensive heart and chronic kidney disease with heart failure and with stage 5 chronic kidney disease, or end stage renal disease: Secondary | ICD-10-CM | POA: Diagnosis not present

## 2019-01-02 DIAGNOSIS — Z992 Dependence on renal dialysis: Secondary | ICD-10-CM | POA: Diagnosis not present

## 2019-01-02 NOTE — Patient Outreach (Signed)
Confirmed with [facility] UM RN that Mr. Mccarley is slated for discharge from [facility] SNF today.  Attempt made again to speak with Mr. [place] prior to SNF discharge about [place] Management services. No answer and unable to leave voicemail message.  Mr. Sanzo has a medical history of HTN, ESRD on HD on Tuesday, Thursdays, Saturdays, DM, AFIB, recent dx of seizures, and CHF.  Per [facility] UM documentation, Mr. Martindale will discharge home with home health services and daughter to assist. DME was arranged.  Will make [place] referral for complex case management since member is discharging home today.   Marthenia Rolling, MSN-Ed, RN,BSN [place] Acute Care Coordinator [phone]

## 2019-01-03 ENCOUNTER — Other Ambulatory Visit: Payer: Self-pay | Admitting: *Deleted

## 2019-01-03 ENCOUNTER — Telehealth: Payer: Self-pay | Admitting: Family

## 2019-01-03 NOTE — Telephone Encounter (Signed)
Caller/Agency: [place], [place] Number: [phone] Requesting OT/PT/Skilled Nursing/Social Work/Speech Therapy: Pt will begin Home Health this Saturday 01/05/2019

## 2019-01-03 NOTE — Patient Outreach (Signed)
[facility]) Care Management  01/03/2019  Bruce Cole [birth year] [id number]    Referral received 01/02/2019 Initial Outreach 01/03/2019  RN attempted outreach call today to both numbers listed (home and mobile) however only able to leave a HIPAA approved voice message to the mobile contact requesting a call back. Will further engage at that time.   Plan to send outreach letter and scheduled another call within the next 4 days.  [name], RN Care Management Coordinator [place] Office [phone]

## 2019-01-04 DIAGNOSIS — I4891 Unspecified atrial fibrillation: Secondary | ICD-10-CM | POA: Diagnosis not present

## 2019-01-04 DIAGNOSIS — D631 Anemia in chronic kidney disease: Secondary | ICD-10-CM | POA: Diagnosis not present

## 2019-01-04 DIAGNOSIS — N186 End stage renal disease: Secondary | ICD-10-CM | POA: Diagnosis not present

## 2019-01-04 DIAGNOSIS — Z992 Dependence on renal dialysis: Secondary | ICD-10-CM | POA: Diagnosis not present

## 2019-01-04 DIAGNOSIS — I5042 Chronic combined systolic (congestive) and diastolic (congestive) heart failure: Secondary | ICD-10-CM | POA: Diagnosis not present

## 2019-01-04 DIAGNOSIS — I132 Hypertensive heart and chronic kidney disease with heart failure and with stage 5 chronic kidney disease, or end stage renal disease: Secondary | ICD-10-CM | POA: Diagnosis not present

## 2019-01-04 NOTE — Telephone Encounter (Signed)
Orders given for them to visit patient for evaluation, they will call back for specific orders on the frequency of therapy

## 2019-01-04 NOTE — Telephone Encounter (Signed)
OK to give order for PT/OT/RN/SW/Speech therapy please.

## 2019-01-07 ENCOUNTER — Telehealth: Payer: Self-pay | Admitting: Family

## 2019-01-07 DIAGNOSIS — D631 Anemia in chronic kidney disease: Secondary | ICD-10-CM | POA: Diagnosis not present

## 2019-01-07 DIAGNOSIS — I4891 Unspecified atrial fibrillation: Secondary | ICD-10-CM | POA: Diagnosis not present

## 2019-01-07 DIAGNOSIS — I132 Hypertensive heart and chronic kidney disease with heart failure and with stage 5 chronic kidney disease, or end stage renal disease: Secondary | ICD-10-CM | POA: Diagnosis not present

## 2019-01-07 DIAGNOSIS — I5042 Chronic combined systolic (congestive) and diastolic (congestive) heart failure: Secondary | ICD-10-CM | POA: Diagnosis not present

## 2019-01-07 DIAGNOSIS — N186 End stage renal disease: Secondary | ICD-10-CM | POA: Diagnosis not present

## 2019-01-07 DIAGNOSIS — Z992 Dependence on renal dialysis: Secondary | ICD-10-CM | POA: Diagnosis not present

## 2019-01-07 NOTE — Telephone Encounter (Signed)
Costella Hatcher OT from [facility] calling requesting verbal orders for 2x1week - 1x3weeks Jim call back # [phone]

## 2019-01-07 NOTE — Telephone Encounter (Signed)
Called Costella Hatcher with verbal orders for OT

## 2019-01-08 ENCOUNTER — Other Ambulatory Visit: Payer: Self-pay | Admitting: *Deleted

## 2019-01-08 ENCOUNTER — Other Ambulatory Visit: Payer: Self-pay

## 2019-01-08 ENCOUNTER — Telehealth (INDEPENDENT_AMBULATORY_CARE_PROVIDER_SITE_OTHER): Payer: Medicare Other | Admitting: Neurology

## 2019-01-08 VITALS — Ht 73.0 in | Wt 240.0 lb

## 2019-01-08 DIAGNOSIS — G253 Myoclonus: Secondary | ICD-10-CM | POA: Diagnosis not present

## 2019-01-08 DIAGNOSIS — T50905A Adverse effect of unspecified drugs, medicaments and biological substances, initial encounter: Secondary | ICD-10-CM | POA: Diagnosis not present

## 2019-01-08 NOTE — Progress Notes (Signed)
Virtual Visit via Video Note The purpose of this virtual visit is to provide medical care while limiting exposure to the novel coronavirus.    Consent was obtained for video visit:  Yes.   Answered questions that patient had about telehealth interaction:  Yes.   I discussed the limitations, risks, security and privacy concerns of performing an evaluation and management service by telemedicine. I also discussed with the patient that there may be a patient responsible charge related to this service. The patient expressed understanding and agreed to proceed.  Pt location: Home Physician Location: office Name of referring provider:  Debbrah Alar, NP I connected with Jori Moll Monda at patients initiation/request on 01/08/2019 at  2:00 PM EDT by video enabled telemedicine application and verified that I am speaking with the correct person using two identifiers. Pt MRN:  [id number] Pt DOB:  [birth year] Video Participants:  Cain Saupe;  [name] Petrov (daughter)   History of Present Illness:  This is a 78 year old right-handed man with a history of hypertension, CHF, atrial fibrillation on chronic anticoagulation with Coumadin, ESRD, chronic pain, presenting for evaluation of diagnosis of seizure. His daughter Hervey Ard is present to provide additional information. He lives alone and had fallen off a stool at the end of April and started having left hip pain. He was back in the ER on 5/19 after falling out of bed. Per notes, he was lying on the bed and started having tremors, the next thing he knew he was on the floor. In the ER, he had an episode with some twitching of his right arm and then he started acting confused for a few minutes. He was felt to have a seizure and started on Keppra. He was back in the ER the next day after an incident at dialysis where he was hypotensive and seemed slow to answer. He was back in the hospital on 5/24 again after a fall, he was found on the floor beside  his bed, slightly confused. Exam showed asterixis. I personally reviewed MRI brain without contrast which did not show any acute changes, there was a chronic left cerebellar infarct, minimal chronic microvascular disease. EEG did not show any epileptiform activity, there was generalized irregular slow activity and slow PDR seen, consistent with encephalopathy. 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 He was discharged home on Keppra renal dosing.   He does not remember being admitted for 5 days and asks if he got dialysis while inpatient. He states he feels much better, he is moving around more but still notes leg swelling. He is concerned that he is listed as diabetic when this "has never been shared with them and they are concerned." He states his glucose levels were always less than 120 while in rehab. He recalls having body jerks and right hand jerks, and denies any further body jerking since hospital discharge. His daughter reports that he has been home from rehab since Wednesday and is back to his usual self, except he is more irritable. She has not seen any body jerking. His arms are stiff, he has difficulty lifting arms above shoulder level. He uses a walker for ambulation due to hip pain. His family has not witnessed any staring/unresponsive episodes, no further confusion. No falls since May.  The patient and his daughter report that he had been taking gabapentin for post-herpetic neuralgia. He states that he had been taking a little more because he was in so much pain.  At one point he was tried on Lyrica but felt the gabapentin helped better. He has been off gabapentin since hospitalization. He denies any olfactory/gustatory hallucinations, deja vu, rising epigastric sensation, focal numbness/tingling/weakness. His grandson has epilepsy. He reports concussions when he played football many years ago. He had a normal birth and early development.   There is no history of febrile convulsions, CNS infections such as meningitis/encephalitis, neurosurgical procedures.    PAST MEDICAL HISTORY: Past Medical History:  Diagnosis Date   Acute blood loss anemia    Atrial fibrillation (HCC)    Chronic   Chronic combined systolic and diastolic heart failure (HCC)    Colon polyp 2000   Dysrhythmia    hx   ESRD on hemodialysis ([place])    [facility]- [place]   Essential hypertension    Gastritis and gastroduodenitis    with bleeding   Gout    Heart murmur    Herpes    History of blood transfusion    Nephrolithiasis    OSA (obstructive sleep apnea) 09/02/2013    IMPRESSION :  1. Mild obstructive sleep apnea with hypopneas causing sleep fragmentation and moderate oxygen desaturation.  2. Short runs of nonsustained VT were noted. His beta blocker may need to be titrated 3. Significant PLM's were noted, the PLM arousal index was low. Please correlate with clinical history of restless leg syndrome.  4. Sleep efficiency was poor.  RECOMMENDATION:  1. Treatment options for this degree of sleep disordered breathing include weight loss and positional therapy to avoid supine sleep 2. Consider titrating beta blocker further, defer to cardiologist 3. Patient should be cautioned against driving when sleepy.They should be asked to avoid medications with sedative side effects      Osteoarthritis of right hip    Pneumonia    hx 30 yrs ago   Primary osteoarthritis of right hip    Rheumatoid arthritis ([place])    Thrombocytopenia ([place])     PAST SURGICAL HISTORY: Past Surgical History:  Procedure Laterality Date   AV FISTULA PLACEMENT Left 08/26/2015   Procedure: LEFT RADIOCEPHALIC FISTULA CREATION;  Surgeon: Rosetta Posner, MD;  Location: [facility];  Service: Vascular;  Laterality: Left;   AV FISTULA PLACEMENT Left 11/23/2015   Procedure: ARTERIOVENOUS (AV) FISTULA CREATION;  Surgeon: Rosetta Posner, MD;  Location: [facility];  Service: Vascular;   Laterality: Left;   BACK SURGERY     x2- discectomy   CHOLECYSTECTOMY  1994   CYSTOSCOPY/RETROGRADE/URETEROSCOPY/STONE EXTRACTION WITH BASKET     ESOPHAGOGASTRODUODENOSCOPY N/A 11/13/2015   Procedure: ESOPHAGOGASTRODUODENOSCOPY (EGD);  Surgeon: Irene Shipper, MD;  Location: [facility];  Service: Endoscopy;  Laterality: N/A;   INSERTION OF DIALYSIS CATHETER Left 11/23/2015   Procedure: INSERTION OF DIALYSIS CATHETER;  Surgeon: Rosetta Posner, MD;  Location: [facility];  Service: Vascular;  Laterality: Left;   IR GENERIC HISTORICAL Left 08/04/2016   IR DIALY SHUNT INTRO NEEDLE/INTRACATH INITIAL W/IMG LEFT 08/04/2016 Markus Daft, MD [facility]   IR GENERIC HISTORICAL  08/04/2016   IR US GUIDE VASC ACCESS LEFT 08/04/2016 Markus Daft, MD [facility]   JOINT REPLACEMENT     Total L-Hip replacement, Right Knee 10/20/09   LEFT AND RIGHT HEART CATHETERIZATION WITH CORONARY ANGIOGRAM N/A 02/22/2013   Procedure: LEFT AND RIGHT HEART CATHETERIZATION WITH CORONARY ANGIOGRAM;  Surgeon: Jolaine Artist, MD;  Location: [facility] C[facility];  Service: Cardiovascular;  Laterality: N/A;   LITHOTRIPSY  90's   PERIPHERAL VASCULAR CATHETERIZATION Left 11/19/2015  Procedure: A/V/Fistulagram;  Surgeon: Conrad Redstone Arsenal, MD;  Location: [facility];  Service: Cardiovascular;  Laterality: Left;   PERIPHERAL VASCULAR CATHETERIZATION Left 07/21/2016   Procedure: A/V Fistulagram;  Surgeon: Conrad Glen Ullin, MD;  Location: [facility];  Service: Cardiovascular;  Laterality: Left;  arm   REVISON OF ARTERIOVENOUS FISTULA Left 05/30/2016   Procedure: REVISION LEFT UPPER ARM FISTULA;  Surgeon: Conrad Moorefield, MD;  Location: [facility];  Service: Vascular;  Laterality: Left;   REVISON OF ARTERIOVENOUS FISTULA Left 07/22/2016   Procedure: REVISON OF BASILIC VEIN TRANSPOSITION ANASTOMOSIS;  Surgeon: Rosetta Posner, MD;  Location: [facility];  Service: Vascular;  Laterality: Left;   SPINE SURGERY     x 2   TOTAL HIP ARTHROPLASTY Right 03/22/2016     Procedure: RIGHT TOTAL HIP ARTHROPLASTY ANTERIOR APPROACH;  Surgeon: Mcarthur Rossetti, MD;  Location: [facility];  Service: Orthopedics;  Laterality: Right;    MEDICATIONS: Current Outpatient Medications on File Prior to Visit  Medication Sig Dispense Refill   atorvastatin (LIPITOR) 10 MG tablet TAKE 1 TABLET(10 MG) BY MOUTH DAILY 90 tablet 1   B Complex-C-Folic Acid (RENA-VITE RX) 1 MG TABS Take 1 tablet by mouth at bedtime.   6   colchicine 0.6 MG tablet Take 1/2 tablet by mouth once as needed for gout flare. Call if no improvement 15 tablet 0   ferric citrate (AURYXIA) 1 GM 210 MG(Fe) tablet Take 2 tablets (420 mg total) by mouth 3 (three) times daily with meals. 270 tablet    hydrOXYzine (ATARAX/VISTARIL) 25 MG tablet Take 1 tablet (25 mg total) by mouth every 8 (eight) hours as needed for itching. 30 tablet 0   levETIRAcetam (KEPPRA) 250 MG tablet Take 250mg  after dialysis sessions.     levETIRAcetam (KEPPRA) 500 MG tablet Take 1 tablet (500 mg total) by mouth daily. 30 tablet 0   midodrine (PROAMATINE) 10 MG tablet Take 1 tablet (10 mg total) by mouth every Monday, Wednesday, and Friday with hemodialysis.     midodrine (PROAMATINE) 5 MG tablet Take 1 tablet (5 mg total) by mouth 2 (two) times daily with a meal.     pantoprazole (PROTONIX) 40 MG tablet Take 1 tablet (40 mg total) by mouth daily. 30 tablet 3   RENVELA 800 MG tablet Take 3 tablets (2,400 mg total) by mouth 3 (three) times daily with meals. 90 tablet 0   SANTYL ointment      warfarin (COUMADIN) 5 MG tablet TK 1 T PO QD     camphor-menthol (SARNA) lotion Apply 1 application topically every 8 (eight) hours as needed for itching. (Patient not taking: Reported on 01/08/2019) 222 mL 0   mupirocin cream (BACTROBAN) 2 % Apply topically daily. Apply Bactroban to buttock lesions Q day, may leave open to air (Patient not taking: Reported on 01/08/2019) 15 g 0   Nutritional Supplements (FEEDING SUPPLEMENT, NEPRO CARB  STEADY,) LIQD Take 237 mLs by mouth 3 (three) times daily as needed (Supplement). (Patient not taking: Reported on 01/08/2019)  0   No current facility-administered medications on file prior to visit.     ALLERGIES: Allergies  Allergen Reactions   Neurontin [Gabapentin] Other (See Comments)    Encephalopathy with myoclonic jerking   Ace Inhibitors Other (See Comments)    Worsening renal insufficiency    FAMILY HISTORY: Family History  Problem Relation Age of Onset   Hypertension Mother    Arthritis Mother        ?  RA   Hypertension Father     Observations/Objective:   Vitals:   01/08/19 1202  Weight: 240 lb (108.9 kg)  Height: 6\' 1"  (1.854 m)   GEN:  The patient appears stated age and is in NAD.  Neurological examination: Patient is awake, alert, oriented x 3. No aphasia or dysarthria. Intact fluency and comprehension. Remote and recent memory intact. Able to name and repeat. Cranial nerves: Extraocular movements intact with no nystagmus. No facial asymmetry. Motor: moves all extremities symmetrically, at least anti-gravity x 4. No incoordination on finger to nose testing. Gait: slow and cautious with walker, no ataxia   Assessment and Plan:   This is a 78 year old right-handed man with a history of hypertension, CHF, atrial fibrillation on chronic anticoagulation with Coumadin, ESRD, chronic pain, presenting for evaluation of diagnosis of seizure. He was having recurrent falls, confusion, and body jerking/twitching. There was report of an episode of right-sided twitching and confusion during one ER visit. He was admitted 5/24, MRI brain no acute changes, EEG showed mild diffuse slowing, no epileptiform discharges. He was diagnosed with uremic encephalopathy and body jerking resolved with discontinuation of Gabapentin. He reports that he was taking more than prescribed due to postherpetic neuralgia. Confusion and myoclonic jerks likely due to gabapentin toxicity in the setting  of uremic encephalopathy. Repeat EEG will be ordered, if normal, we will stop Keppra. Diagnosis and plan discussed with patient and daughter, who are in agreement. He will follow-up in 6 months, family knows to call for any changes.   Follow Up Instructions:   -I discussed the assessment and treatment plan with the patient. The patient was provided an opportunity to ask questions and all were answered. The patient agreed with the plan and demonstrated an understanding of the instructions.   The patient was advised to call back or seek an in-person evaluation if the symptoms worsen or if the condition fails to improve as anticipated.    Bruce Sprang, MD

## 2019-01-08 NOTE — Patient Outreach (Signed)
[facility]) Care Management  01/08/2019  Bruce Cole 11/11/1940 [id number]   2nd outreach attempt unsuccessful to both contact numbers. RN able to leave a HIPPA message to the cell contact number. Will further engage at that time on possible needs. Note no response to the recent outreach letter from last week. Will awaiting possible contact and rescheduled an additional outreach.  [name], RN Care Management Coordinator [place] Office [phone]

## 2019-01-08 NOTE — Telephone Encounter (Signed)
Pam calling from [facility] is requesting skilled nursing for 1x1 2x8    Cb#[phone]

## 2019-01-08 NOTE — Telephone Encounter (Signed)
Spoke w/ Cherlyn Cushing- verbal orders given.

## 2019-01-09 ENCOUNTER — Telehealth: Payer: Self-pay | Admitting: Family

## 2019-01-09 DIAGNOSIS — I132 Hypertensive heart and chronic kidney disease with heart failure and with stage 5 chronic kidney disease, or end stage renal disease: Secondary | ICD-10-CM | POA: Diagnosis not present

## 2019-01-09 DIAGNOSIS — I5042 Chronic combined systolic (congestive) and diastolic (congestive) heart failure: Secondary | ICD-10-CM | POA: Diagnosis not present

## 2019-01-09 DIAGNOSIS — Z992 Dependence on renal dialysis: Secondary | ICD-10-CM | POA: Diagnosis not present

## 2019-01-09 DIAGNOSIS — N186 End stage renal disease: Secondary | ICD-10-CM | POA: Diagnosis not present

## 2019-01-09 DIAGNOSIS — D631 Anemia in chronic kidney disease: Secondary | ICD-10-CM | POA: Diagnosis not present

## 2019-01-09 DIAGNOSIS — I4891 Unspecified atrial fibrillation: Secondary | ICD-10-CM | POA: Diagnosis not present

## 2019-01-09 NOTE — Telephone Encounter (Signed)
Bruce Cole with [facility] is calling an needs PT order 1x1 , 2x4, 1x3

## 2019-01-09 NOTE — Telephone Encounter (Signed)
LMOM for [place] w/ verbal orders.

## 2019-01-10 ENCOUNTER — Telehealth: Payer: Self-pay | Admitting: Family

## 2019-01-10 MED ORDER — LEVETIRACETAM 500 MG PO TABS: ORAL_TABLET | ORAL | 1 refills | Status: AC

## 2019-01-10 NOTE — Telephone Encounter (Signed)
Spoke with Dr. Delice Lesch- will continue keppra for now pending EEG results/review.

## 2019-01-11 DIAGNOSIS — I4891 Unspecified atrial fibrillation: Secondary | ICD-10-CM | POA: Diagnosis not present

## 2019-01-11 DIAGNOSIS — I5042 Chronic combined systolic (congestive) and diastolic (congestive) heart failure: Secondary | ICD-10-CM | POA: Diagnosis not present

## 2019-01-11 DIAGNOSIS — N186 End stage renal disease: Secondary | ICD-10-CM | POA: Diagnosis not present

## 2019-01-11 DIAGNOSIS — I132 Hypertensive heart and chronic kidney disease with heart failure and with stage 5 chronic kidney disease, or end stage renal disease: Secondary | ICD-10-CM | POA: Diagnosis not present

## 2019-01-11 DIAGNOSIS — Z992 Dependence on renal dialysis: Secondary | ICD-10-CM | POA: Diagnosis not present

## 2019-01-11 DIAGNOSIS — D631 Anemia in chronic kidney disease: Secondary | ICD-10-CM | POA: Diagnosis not present

## 2019-01-14 ENCOUNTER — Other Ambulatory Visit: Payer: Self-pay

## 2019-01-14 DIAGNOSIS — Z992 Dependence on renal dialysis: Secondary | ICD-10-CM | POA: Diagnosis not present

## 2019-01-14 DIAGNOSIS — N186 End stage renal disease: Secondary | ICD-10-CM | POA: Diagnosis not present

## 2019-01-14 DIAGNOSIS — D631 Anemia in chronic kidney disease: Secondary | ICD-10-CM | POA: Diagnosis not present

## 2019-01-14 DIAGNOSIS — I132 Hypertensive heart and chronic kidney disease with heart failure and with stage 5 chronic kidney disease, or end stage renal disease: Secondary | ICD-10-CM | POA: Diagnosis not present

## 2019-01-14 DIAGNOSIS — I5042 Chronic combined systolic (congestive) and diastolic (congestive) heart failure: Secondary | ICD-10-CM | POA: Diagnosis not present

## 2019-01-14 DIAGNOSIS — I4891 Unspecified atrial fibrillation: Secondary | ICD-10-CM | POA: Diagnosis not present

## 2019-01-14 NOTE — Patient Outreach (Signed)
[facility]) Care Management  01/14/2019  Bruce Cole 12/27/1940 [id number]  Unsuccessful Outreach Call x 3.   Called member at preferred number and voicemail full. Called member's secondary number and HIPPA compliant voicemail message left.   Will place on hold per [facility] workflow.   [name], RN-BSN  [facility] Care Management  Community Care Management Coordinator  [phone] [email or web address]

## 2019-01-16 ENCOUNTER — Other Ambulatory Visit: Payer: Self-pay

## 2019-01-16 DIAGNOSIS — N2581 Secondary hyperparathyroidism of renal origin: Secondary | ICD-10-CM | POA: Diagnosis not present

## 2019-01-16 DIAGNOSIS — I158 Other secondary hypertension: Secondary | ICD-10-CM | POA: Diagnosis not present

## 2019-01-16 DIAGNOSIS — I4891 Unspecified atrial fibrillation: Secondary | ICD-10-CM | POA: Diagnosis not present

## 2019-01-16 DIAGNOSIS — N186 End stage renal disease: Secondary | ICD-10-CM | POA: Diagnosis not present

## 2019-01-16 DIAGNOSIS — Z992 Dependence on renal dialysis: Secondary | ICD-10-CM | POA: Diagnosis not present

## 2019-01-16 DIAGNOSIS — D631 Anemia in chronic kidney disease: Secondary | ICD-10-CM | POA: Diagnosis not present

## 2019-01-16 NOTE — Patient Outreach (Signed)
[facility]) Care Management  01/16/2019  Draco Malczewski [place] 1940/09/17 [id number]   Case Closed due to unable to Unsuccessful outreaches and [facility] workflow process completed.   No other [facility] disciplines involved. Will send case closure letter to PCP.   [name], RN-BSN  [facility] Care Management  Community Care Management Coordinator  [phone] [email or web address]

## 2019-01-17 ENCOUNTER — Telehealth: Payer: Self-pay | Admitting: Family

## 2019-01-17 MED ORDER — SANTYL 250 UNIT/GM EX OINT: 1.0000 "application " | TOPICAL_OINTMENT | Freq: Every day | CUTANEOUS | 2 refills | Status: AC

## 2019-01-17 NOTE — Telephone Encounter (Signed)
Medication Refill - Medication: SANTYL ointment [[id number]] (Patient is completely out)   Has the patient contacted their pharmacy? Yes (Agent: If no, request that the patient contact the pharmacy for the refill.) (Agent: If yes, when and what did the pharmacy advise?)Contact PCP  Preferred Pharmacy (with phone number or street name):  [id number] - [place], [address] AT [address] [phone] (Phone) [phone] (Fax)     Agent: Please be advised that RX refills may take up to 3 business days. We ask that you follow-up with your pharmacy.

## 2019-01-18 DIAGNOSIS — N2581 Secondary hyperparathyroidism of renal origin: Secondary | ICD-10-CM | POA: Diagnosis not present

## 2019-01-18 DIAGNOSIS — D631 Anemia in chronic kidney disease: Secondary | ICD-10-CM | POA: Diagnosis not present

## 2019-01-18 DIAGNOSIS — I4891 Unspecified atrial fibrillation: Secondary | ICD-10-CM | POA: Diagnosis not present

## 2019-01-18 DIAGNOSIS — N186 End stage renal disease: Secondary | ICD-10-CM | POA: Diagnosis not present

## 2019-01-21 DIAGNOSIS — N2581 Secondary hyperparathyroidism of renal origin: Secondary | ICD-10-CM | POA: Diagnosis not present

## 2019-01-21 DIAGNOSIS — D631 Anemia in chronic kidney disease: Secondary | ICD-10-CM | POA: Diagnosis not present

## 2019-01-21 DIAGNOSIS — I4891 Unspecified atrial fibrillation: Secondary | ICD-10-CM | POA: Diagnosis not present

## 2019-01-21 DIAGNOSIS — N186 End stage renal disease: Secondary | ICD-10-CM | POA: Diagnosis not present

## 2019-01-23 DIAGNOSIS — D631 Anemia in chronic kidney disease: Secondary | ICD-10-CM | POA: Diagnosis not present

## 2019-01-23 DIAGNOSIS — N2581 Secondary hyperparathyroidism of renal origin: Secondary | ICD-10-CM | POA: Diagnosis not present

## 2019-01-23 DIAGNOSIS — I4891 Unspecified atrial fibrillation: Secondary | ICD-10-CM | POA: Diagnosis not present

## 2019-01-23 DIAGNOSIS — N186 End stage renal disease: Secondary | ICD-10-CM | POA: Diagnosis not present

## 2019-01-24 ENCOUNTER — Other Ambulatory Visit: Payer: Medicare Other

## 2019-01-24 LAB — PROTIME-INR

## 2019-01-24 NOTE — Telephone Encounter (Signed)
Pharmacist advised patient has 2 wounds about 1/2 cm wide and he uses the cream 3x a day on each. He estimated the 30 grams prescribed was the correct amount for one month supply. Patient advised pharmacy will have the medication ready for him to pick up today.

## 2019-01-24 NOTE — Telephone Encounter (Signed)
Per representative at [facility] we will need to contact department in [place] to add a diagnosis their number is [phone] option #6. Called but that department was not taking calls at this time.

## 2019-01-24 NOTE — Telephone Encounter (Signed)
Please advise 

## 2019-01-24 NOTE — Telephone Encounter (Signed)
Can you please ask patient where he is applying the ointment? Also, please check status of patient's home INR machine that we started working on ([facility]).

## 2019-01-24 NOTE — Telephone Encounter (Signed)
Danny with [employer] and states that they are needing more information for the SANTYL ointment  States that they are needing to know the size of the wound, size/amount of the product, and the day supply. States that this information is needed so they can provide the patient with enough ointment for the wound. Please advise  CB#: [phone]

## 2019-01-25 DIAGNOSIS — D631 Anemia in chronic kidney disease: Secondary | ICD-10-CM | POA: Diagnosis not present

## 2019-01-25 DIAGNOSIS — N186 End stage renal disease: Secondary | ICD-10-CM | POA: Diagnosis not present

## 2019-01-25 DIAGNOSIS — I4891 Unspecified atrial fibrillation: Secondary | ICD-10-CM | POA: Diagnosis not present

## 2019-01-25 DIAGNOSIS — N2581 Secondary hyperparathyroidism of renal origin: Secondary | ICD-10-CM | POA: Diagnosis not present

## 2019-01-28 DIAGNOSIS — N2581 Secondary hyperparathyroidism of renal origin: Secondary | ICD-10-CM | POA: Diagnosis not present

## 2019-01-28 DIAGNOSIS — N186 End stage renal disease: Secondary | ICD-10-CM | POA: Diagnosis not present

## 2019-01-28 DIAGNOSIS — D631 Anemia in chronic kidney disease: Secondary | ICD-10-CM | POA: Diagnosis not present

## 2019-01-28 DIAGNOSIS — I4891 Unspecified atrial fibrillation: Secondary | ICD-10-CM | POA: Diagnosis not present

## 2019-01-28 NOTE — Telephone Encounter (Signed)
Was unable to reach anyone at this company, original form from 12-11-18 printed out and re faxed with additional diagnosis.

## 2019-01-30 DIAGNOSIS — N2581 Secondary hyperparathyroidism of renal origin: Secondary | ICD-10-CM | POA: Diagnosis not present

## 2019-01-30 DIAGNOSIS — I4891 Unspecified atrial fibrillation: Secondary | ICD-10-CM | POA: Diagnosis not present

## 2019-01-30 DIAGNOSIS — N186 End stage renal disease: Secondary | ICD-10-CM | POA: Diagnosis not present

## 2019-01-30 DIAGNOSIS — D631 Anemia in chronic kidney disease: Secondary | ICD-10-CM | POA: Diagnosis not present

## 2019-01-31 ENCOUNTER — Ambulatory Visit (INDEPENDENT_AMBULATORY_CARE_PROVIDER_SITE_OTHER): Payer: Medicare Other | Admitting: Neurology

## 2019-01-31 ENCOUNTER — Other Ambulatory Visit: Payer: Self-pay

## 2019-01-31 DIAGNOSIS — G253 Myoclonus: Secondary | ICD-10-CM

## 2019-01-31 DIAGNOSIS — T50905A Adverse effect of unspecified drugs, medicaments and biological substances, initial encounter: Secondary | ICD-10-CM

## 2019-01-31 NOTE — Procedures (Signed)
ELECTROENCEPHALOGRAM REPORT  Date of Study: 01/31/2019  Patient's Name: Bruce Cole MRN: [id number] Date of Birth: 1941-05-24  Referring Provider: Dr. Ellouise Newer  Clinical History: This is a 78 year old man with was having recurrent falls, confusion, and body jerking/twitching. There was report of an episode of right-sided twitching and confusion during one ER visit. He was diagnosed with uremic encephalopathy and body jerking resolved with discontinuation of Gabapentin.   Medications: KEPPRA 250 MG tablet KEPPRA 500 MG tablet LIPITOR 10 MG tablet RENA-VITE RX 1 MG TABS colchicine 0.6 MG tablet AURYXIA 1 GM 210 MG(Fe) tablet ATARAX/VISTARIL 25 MG tablet PROAMATINE 10 MG tablet PROAMATINE 5 MG tablet PROTONIX 40 MG tablet RENVELA 800 MG tablet SANTYL ointment COUMADIN 5 MG tablet  Technical Summary: A multichannel digital 1-hour EEG recording measured by the international 10-20 system with electrodes applied with paste and impedances below 5000 ohms performed in our laboratory with EKG monitoring in an awake and asleep patient.  Hyperventilation was not performed. Photic stimulation was performed.  The digital EEG was referentially recorded, reformatted, and digitally filtered in a variety of bipolar and referential montages for optimal display.    Description: The patient is awake and asleep during the recording.  During maximal wakefulness, there is a symmetric, medium voltage 9.5 Hz posterior dominant rhythm that attenuates with eye opening.  The record is symmetric.  During drowsiness and sleep, there is an increase in theta slowing of the background.  Vertex waves and symmetric sleep spindles were seen. Photic stimulation did not elicit any abnormalities.  There were no epileptiform discharges or electrographic seizures seen.    EKG lead showed sinus bradycardia at 48 bpm.  Impression: This 1-hour awake and asleep EEG is normal.    Clinical Correlation: A normal EEG  does not exclude a clinical diagnosis of epilepsy.  If further clinical questions remain, prolonged EEG may be helpful.  Clinical correlation is advised.   Ellouise Newer, M.D.

## 2019-01-31 NOTE — Telephone Encounter (Signed)
Spoke with patient, he will have daughter call and confirm report and dose of medication.

## 2019-02-01 ENCOUNTER — Telehealth: Payer: Self-pay | Admitting: Neurology

## 2019-02-01 ENCOUNTER — Telehealth: Payer: Self-pay | Admitting: Family

## 2019-02-01 DIAGNOSIS — N2581 Secondary hyperparathyroidism of renal origin: Secondary | ICD-10-CM | POA: Diagnosis not present

## 2019-02-01 DIAGNOSIS — N186 End stage renal disease: Secondary | ICD-10-CM | POA: Diagnosis not present

## 2019-02-01 DIAGNOSIS — D631 Anemia in chronic kidney disease: Secondary | ICD-10-CM | POA: Diagnosis not present

## 2019-02-01 DIAGNOSIS — I4891 Unspecified atrial fibrillation: Secondary | ICD-10-CM | POA: Diagnosis not present

## 2019-02-01 NOTE — Telephone Encounter (Signed)
New Message  Patients daughter verbalized she would to speak with nurse and MD in regards to her fathers EEG and information regarding the appointment.  Please f/u

## 2019-02-01 NOTE — Telephone Encounter (Signed)
Left message to return call.  Looks like EEG was normal. There is not an upcoming appt scheduled.

## 2019-02-01 NOTE — Telephone Encounter (Signed)
Home Health Verbal Orders - Caller/Agency: Coralyn Mark with [facility] Number: [phone] Requesting OT/PT/Skilled Nursing/Social Work/Speech Therapy: just fyi missed PT visit yesterday, pt has a few appt and test and was not feeling up to PT

## 2019-02-01 NOTE — Telephone Encounter (Signed)
Was just an [place].

## 2019-02-01 NOTE — Telephone Encounter (Signed)
FYI

## 2019-02-01 NOTE — Telephone Encounter (Signed)
OK to give verbal orders as requested below.

## 2019-02-04 ENCOUNTER — Telehealth: Payer: Self-pay | Admitting: Neurology

## 2019-02-04 DIAGNOSIS — N186 End stage renal disease: Secondary | ICD-10-CM | POA: Diagnosis not present

## 2019-02-04 DIAGNOSIS — N2581 Secondary hyperparathyroidism of renal origin: Secondary | ICD-10-CM | POA: Diagnosis not present

## 2019-02-04 DIAGNOSIS — D631 Anemia in chronic kidney disease: Secondary | ICD-10-CM | POA: Diagnosis not present

## 2019-02-04 DIAGNOSIS — I4891 Unspecified atrial fibrillation: Secondary | ICD-10-CM | POA: Diagnosis not present

## 2019-02-04 NOTE — Telephone Encounter (Signed)
Follow up  Patient returning nurses call in regards to patients results

## 2019-02-05 NOTE — Telephone Encounter (Signed)
Dr. Delice Lesch,  Will you please make sure that the EEG was normal. The daughter has requested results. She also wants to know if there are any medications on his list that could be eliminated. Daughter does not want him taking anything he does not need. No symptoms to c/o at this time.

## 2019-02-05 NOTE — Telephone Encounter (Signed)
Bruce Cole informed to decreased Keppra to 1/2 daily for one week then d/c. Generic name given. Also, informed that no additional dose is needed after dialysis. Confirmed that EEG was normal.  She was appreciative and understood.

## 2019-02-05 NOTE — Telephone Encounter (Signed)
Not sure who I sent Result Note last time, here are results:  Notes recorded by Cameron Sprang, MD on 01/31/2019 at 12:07 PM EDT  Not sure who the contact is now, the daughter [name] was with him on last visit. Pls call either [name] or son listed as contact that the EEG was normal, let's reduce the Keppra to 1/2 tablet daily for a week, then stop it. No more need to give the additional dose after dialysis. Thanks

## 2019-02-06 DIAGNOSIS — N2581 Secondary hyperparathyroidism of renal origin: Secondary | ICD-10-CM | POA: Diagnosis not present

## 2019-02-06 DIAGNOSIS — D631 Anemia in chronic kidney disease: Secondary | ICD-10-CM | POA: Diagnosis not present

## 2019-02-06 DIAGNOSIS — N186 End stage renal disease: Secondary | ICD-10-CM | POA: Diagnosis not present

## 2019-02-06 DIAGNOSIS — I4891 Unspecified atrial fibrillation: Secondary | ICD-10-CM | POA: Diagnosis not present

## 2019-02-07 ENCOUNTER — Telehealth: Payer: Self-pay

## 2019-02-07 ENCOUNTER — Other Ambulatory Visit: Payer: Self-pay

## 2019-02-07 LAB — PROTIME-INR: INR: 2.3 — AB (ref 0.9–1.1)

## 2019-02-07 MED ORDER — PANTOPRAZOLE SODIUM 40 MG PO TBEC: 40.0000 mg | DELAYED_RELEASE_TABLET | Freq: Every day | ORAL | 3 refills | Status: AC

## 2019-02-07 NOTE — Telephone Encounter (Signed)
Spoke to [place] and she was informed I talked to [place] from [facility] earlier today, the area of concern in on his shin, left lower leg and note was forwarded to Provider for wound care referral.

## 2019-02-07 NOTE — Telephone Encounter (Signed)
Copied from [place] [id number]. Topic: General - Other >> Feb 07, 2019 10:54 AM Rainey Pines A wrote: Joycelyn Schmid at [facility] received referral for stage 2/3 on buttock and nurse stated that he has a large area of black eschar near chin area and its 17 by 5 centimeters and would like to get an order for a wound center. Joycelyn Schmid would like to speak with a nurse in regards to patient potentially having circulatory issues.  She can be reached at [phone] or call and ask to speak with any Manager if called tomorrow.

## 2019-02-07 NOTE — Telephone Encounter (Signed)
Copied from [place] [phone]. Topic: Referral - Request for Referral >> Feb 07, 2019  8:35 AM Nils Flack wrote: Has patient seen PCP for this complaint? Yes.   -*If NO, is insurance requiring patient see PCP for this issue before PCP can refer them? Referral for which specialty: wound care  Preferred provider/office:  Reason for referral: pt has a scrape on his calf and shin measuring 17 x 5. Pt states it has been there for  months.  It is black in color.   Home health nurse asking for urgent referral to get it looked at.  Pt can only go on Tues and Thurs due to dialysis.   Please call scott, rn at [facility] at [place]

## 2019-02-08 ENCOUNTER — Other Ambulatory Visit: Payer: Self-pay

## 2019-02-08 DIAGNOSIS — I4891 Unspecified atrial fibrillation: Secondary | ICD-10-CM | POA: Diagnosis not present

## 2019-02-08 DIAGNOSIS — N2581 Secondary hyperparathyroidism of renal origin: Secondary | ICD-10-CM | POA: Diagnosis not present

## 2019-02-08 DIAGNOSIS — L989 Disorder of the skin and subcutaneous tissue, unspecified: Secondary | ICD-10-CM

## 2019-02-08 DIAGNOSIS — N186 End stage renal disease: Secondary | ICD-10-CM | POA: Diagnosis not present

## 2019-02-08 DIAGNOSIS — D631 Anemia in chronic kidney disease: Secondary | ICD-10-CM | POA: Diagnosis not present

## 2019-02-08 NOTE — Telephone Encounter (Signed)
I was able to get patient an appointment at the wound center in [place] on 02-14-19. Appointment date and time given to patient's daughter.

## 2019-02-08 NOTE — Telephone Encounter (Signed)
I was able to schedule patient at the wound center in [place] on 02/14/19 at 1:15. Appointment information give to patient's daughter with the phone number to call for details.

## 2019-02-08 NOTE — Telephone Encounter (Signed)
Bruce Cole daughter called - she is asking for referral to be expedited for wound care   She would like a call regarding this, she is his health care proxy cb is [phone]

## 2019-02-08 NOTE — Telephone Encounter (Signed)
Ok per [employer] referral was entered to the wound care center. Per the wound care of [facility] no appointments are available until August 10th.

## 2019-02-08 NOTE — Telephone Encounter (Signed)
Ok per [employer] referral was entered to the wound care center. Per the wound care of [facility] no appointments are available until August 10th

## 2019-02-10 ENCOUNTER — Encounter (HOSPITAL_BASED_OUTPATIENT_CLINIC_OR_DEPARTMENT_OTHER): Payer: Self-pay | Admitting: Emergency Medicine

## 2019-02-10 ENCOUNTER — Inpatient Hospital Stay (HOSPITAL_BASED_OUTPATIENT_CLINIC_OR_DEPARTMENT_OTHER)
Admission: EM | Admit: 2019-02-10 | Discharge: 2019-03-19 | DRG: 602 | Disposition: E | Payer: Medicare Other | Attending: Family Medicine | Admitting: Family Medicine

## 2019-02-10 ENCOUNTER — Other Ambulatory Visit: Payer: Self-pay

## 2019-02-10 ENCOUNTER — Emergency Department (HOSPITAL_BASED_OUTPATIENT_CLINIC_OR_DEPARTMENT_OTHER): Payer: Medicare Other

## 2019-02-10 DIAGNOSIS — B029 Zoster without complications: Secondary | ICD-10-CM | POA: Diagnosis present

## 2019-02-10 DIAGNOSIS — I83222 Varicose veins of left lower extremity with both ulcer of calf and inflammation: Secondary | ICD-10-CM

## 2019-02-10 DIAGNOSIS — E785 Hyperlipidemia, unspecified: Secondary | ICD-10-CM | POA: Diagnosis present

## 2019-02-10 DIAGNOSIS — I480 Paroxysmal atrial fibrillation: Secondary | ICD-10-CM | POA: Diagnosis not present

## 2019-02-10 DIAGNOSIS — I517 Cardiomegaly: Secondary | ICD-10-CM | POA: Diagnosis not present

## 2019-02-10 DIAGNOSIS — I96 Gangrene, not elsewhere classified: Secondary | ICD-10-CM | POA: Diagnosis not present

## 2019-02-10 DIAGNOSIS — L97929 Non-pressure chronic ulcer of unspecified part of left lower leg with unspecified severity: Secondary | ICD-10-CM | POA: Diagnosis present

## 2019-02-10 DIAGNOSIS — G9349 Other encephalopathy: Secondary | ICD-10-CM | POA: Diagnosis not present

## 2019-02-10 DIAGNOSIS — I428 Other cardiomyopathies: Secondary | ICD-10-CM | POA: Diagnosis present

## 2019-02-10 DIAGNOSIS — G2581 Restless legs syndrome: Secondary | ICD-10-CM | POA: Diagnosis present

## 2019-02-10 DIAGNOSIS — M60062 Infective myositis, left lower leg: Secondary | ICD-10-CM | POA: Diagnosis not present

## 2019-02-10 DIAGNOSIS — L97221 Non-pressure chronic ulcer of left calf limited to breakdown of skin: Secondary | ICD-10-CM

## 2019-02-10 DIAGNOSIS — L03116 Cellulitis of left lower limb: Principal | ICD-10-CM | POA: Diagnosis present

## 2019-02-10 DIAGNOSIS — I10 Essential (primary) hypertension: Secondary | ICD-10-CM | POA: Diagnosis not present

## 2019-02-10 DIAGNOSIS — L0390 Cellulitis, unspecified: Secondary | ICD-10-CM | POA: Diagnosis present

## 2019-02-10 DIAGNOSIS — I132 Hypertensive heart and chronic kidney disease with heart failure and with stage 5 chronic kidney disease, or end stage renal disease: Secondary | ICD-10-CM | POA: Diagnosis present

## 2019-02-10 DIAGNOSIS — I361 Nonrheumatic tricuspid (valve) insufficiency: Secondary | ICD-10-CM | POA: Diagnosis not present

## 2019-02-10 DIAGNOSIS — E876 Hypokalemia: Secondary | ICD-10-CM | POA: Diagnosis present

## 2019-02-10 DIAGNOSIS — E1129 Type 2 diabetes mellitus with other diabetic kidney complication: Secondary | ICD-10-CM | POA: Diagnosis present

## 2019-02-10 DIAGNOSIS — S81802A Unspecified open wound, left lower leg, initial encounter: Secondary | ICD-10-CM

## 2019-02-10 DIAGNOSIS — M069 Rheumatoid arthritis, unspecified: Secondary | ICD-10-CM | POA: Diagnosis present

## 2019-02-10 DIAGNOSIS — Z992 Dependence on renal dialysis: Secondary | ICD-10-CM

## 2019-02-10 DIAGNOSIS — E872 Acidosis: Secondary | ICD-10-CM | POA: Diagnosis present

## 2019-02-10 DIAGNOSIS — E1151 Type 2 diabetes mellitus with diabetic peripheral angiopathy without gangrene: Secondary | ICD-10-CM | POA: Diagnosis present

## 2019-02-10 DIAGNOSIS — Z794 Long term (current) use of insulin: Secondary | ICD-10-CM

## 2019-02-10 DIAGNOSIS — D631 Anemia in chronic kidney disease: Secondary | ICD-10-CM | POA: Diagnosis present

## 2019-02-10 DIAGNOSIS — W19XXXA Unspecified fall, initial encounter: Secondary | ICD-10-CM | POA: Diagnosis not present

## 2019-02-10 DIAGNOSIS — L89312 Pressure ulcer of right buttock, stage 2: Secondary | ICD-10-CM | POA: Diagnosis present

## 2019-02-10 DIAGNOSIS — I9589 Other hypotension: Secondary | ICD-10-CM | POA: Diagnosis present

## 2019-02-10 DIAGNOSIS — E1122 Type 2 diabetes mellitus with diabetic chronic kidney disease: Secondary | ICD-10-CM | POA: Diagnosis present

## 2019-02-10 DIAGNOSIS — L03115 Cellulitis of right lower limb: Secondary | ICD-10-CM | POA: Diagnosis present

## 2019-02-10 DIAGNOSIS — R569 Unspecified convulsions: Secondary | ICD-10-CM

## 2019-02-10 DIAGNOSIS — E440 Moderate protein-calorie malnutrition: Secondary | ICD-10-CM | POA: Diagnosis present

## 2019-02-10 DIAGNOSIS — I482 Chronic atrial fibrillation, unspecified: Secondary | ICD-10-CM | POA: Diagnosis present

## 2019-02-10 DIAGNOSIS — R05 Cough: Secondary | ICD-10-CM | POA: Diagnosis not present

## 2019-02-10 DIAGNOSIS — Z7902 Long term (current) use of antithrombotics/antiplatelets: Secondary | ICD-10-CM

## 2019-02-10 DIAGNOSIS — N186 End stage renal disease: Secondary | ICD-10-CM

## 2019-02-10 DIAGNOSIS — I5042 Chronic combined systolic (congestive) and diastolic (congestive) heart failure: Secondary | ICD-10-CM | POA: Diagnosis not present

## 2019-02-10 DIAGNOSIS — Z96641 Presence of right artificial hip joint: Secondary | ICD-10-CM | POA: Diagnosis present

## 2019-02-10 DIAGNOSIS — G40909 Epilepsy, unspecified, not intractable, without status epilepticus: Secondary | ICD-10-CM | POA: Diagnosis present

## 2019-02-10 DIAGNOSIS — I120 Hypertensive chronic kidney disease with stage 5 chronic kidney disease or end stage renal disease: Secondary | ICD-10-CM | POA: Diagnosis not present

## 2019-02-10 DIAGNOSIS — N2581 Secondary hyperparathyroidism of renal origin: Secondary | ICD-10-CM | POA: Diagnosis present

## 2019-02-10 DIAGNOSIS — Z79899 Other long term (current) drug therapy: Secondary | ICD-10-CM

## 2019-02-10 DIAGNOSIS — T82838A Hemorrhage of vascular prosthetic devices, implants and grafts, initial encounter: Secondary | ICD-10-CM | POA: Diagnosis present

## 2019-02-10 DIAGNOSIS — R531 Weakness: Secondary | ICD-10-CM | POA: Diagnosis not present

## 2019-02-10 DIAGNOSIS — Y841 Kidney dialysis as the cause of abnormal reaction of the patient, or of later complication, without mention of misadventure at the time of the procedure: Secondary | ICD-10-CM | POA: Diagnosis present

## 2019-02-10 DIAGNOSIS — R0602 Shortness of breath: Secondary | ICD-10-CM

## 2019-02-10 DIAGNOSIS — R600 Localized edema: Secondary | ICD-10-CM | POA: Diagnosis not present

## 2019-02-10 DIAGNOSIS — G4733 Obstructive sleep apnea (adult) (pediatric): Secondary | ICD-10-CM | POA: Diagnosis present

## 2019-02-10 DIAGNOSIS — E1121 Type 2 diabetes mellitus with diabetic nephropathy: Secondary | ICD-10-CM | POA: Diagnosis not present

## 2019-02-10 DIAGNOSIS — T82898A Other specified complication of vascular prosthetic devices, implants and grafts, initial encounter: Secondary | ICD-10-CM | POA: Diagnosis not present

## 2019-02-10 DIAGNOSIS — I5082 Biventricular heart failure: Secondary | ICD-10-CM | POA: Diagnosis present

## 2019-02-10 DIAGNOSIS — I4819 Other persistent atrial fibrillation: Secondary | ICD-10-CM | POA: Diagnosis not present

## 2019-02-10 DIAGNOSIS — I5043 Acute on chronic combined systolic (congestive) and diastolic (congestive) heart failure: Secondary | ICD-10-CM | POA: Diagnosis present

## 2019-02-10 DIAGNOSIS — M609 Myositis, unspecified: Secondary | ICD-10-CM | POA: Diagnosis present

## 2019-02-10 DIAGNOSIS — F419 Anxiety disorder, unspecified: Secondary | ICD-10-CM | POA: Diagnosis present

## 2019-02-10 DIAGNOSIS — I4891 Unspecified atrial fibrillation: Secondary | ICD-10-CM | POA: Diagnosis present

## 2019-02-10 DIAGNOSIS — R0902 Hypoxemia: Secondary | ICD-10-CM | POA: Diagnosis not present

## 2019-02-10 DIAGNOSIS — Z8249 Family history of ischemic heart disease and other diseases of the circulatory system: Secondary | ICD-10-CM

## 2019-02-10 DIAGNOSIS — R931 Abnormal findings on diagnostic imaging of heart and coronary circulation: Secondary | ICD-10-CM | POA: Diagnosis not present

## 2019-02-10 DIAGNOSIS — R296 Repeated falls: Secondary | ICD-10-CM | POA: Diagnosis present

## 2019-02-10 DIAGNOSIS — I371 Nonrheumatic pulmonary valve insufficiency: Secondary | ICD-10-CM | POA: Diagnosis not present

## 2019-02-10 DIAGNOSIS — I959 Hypotension, unspecified: Secondary | ICD-10-CM | POA: Diagnosis not present

## 2019-02-10 DIAGNOSIS — S81802D Unspecified open wound, left lower leg, subsequent encounter: Secondary | ICD-10-CM | POA: Diagnosis not present

## 2019-02-10 DIAGNOSIS — M1611 Unilateral primary osteoarthritis, right hip: Secondary | ICD-10-CM | POA: Diagnosis present

## 2019-02-10 DIAGNOSIS — Z66 Do not resuscitate: Secondary | ICD-10-CM | POA: Diagnosis present

## 2019-02-10 DIAGNOSIS — M109 Gout, unspecified: Secondary | ICD-10-CM | POA: Diagnosis present

## 2019-02-10 DIAGNOSIS — Z7901 Long term (current) use of anticoagulants: Secondary | ICD-10-CM

## 2019-02-10 DIAGNOSIS — Z8719 Personal history of other diseases of the digestive system: Secondary | ICD-10-CM

## 2019-02-10 DIAGNOSIS — F329 Major depressive disorder, single episode, unspecified: Secondary | ICD-10-CM | POA: Diagnosis present

## 2019-02-10 DIAGNOSIS — I700 Atherosclerosis of aorta: Secondary | ICD-10-CM | POA: Diagnosis not present

## 2019-02-10 DIAGNOSIS — Z20828 Contact with and (suspected) exposure to other viral communicable diseases: Secondary | ICD-10-CM | POA: Diagnosis present

## 2019-02-10 DIAGNOSIS — Z9049 Acquired absence of other specified parts of digestive tract: Secondary | ICD-10-CM

## 2019-02-10 DIAGNOSIS — I5021 Acute systolic (congestive) heart failure: Secondary | ICD-10-CM | POA: Diagnosis not present

## 2019-02-10 DIAGNOSIS — I872 Venous insufficiency (chronic) (peripheral): Secondary | ICD-10-CM | POA: Diagnosis present

## 2019-02-10 DIAGNOSIS — Z8261 Family history of arthritis: Secondary | ICD-10-CM

## 2019-02-10 DIAGNOSIS — E1140 Type 2 diabetes mellitus with diabetic neuropathy, unspecified: Secondary | ICD-10-CM | POA: Diagnosis present

## 2019-02-10 HISTORY — DX: Unspecified convulsions: R56.9

## 2019-02-10 LAB — CBC WITH DIFFERENTIAL/PLATELET
Abs Immature Granulocytes: 0.07 10*3/uL (ref 0.00–0.07)
Basophils Absolute: 0 10*3/uL (ref 0.0–0.1)
Basophils Relative: 0 %
Eosinophils Absolute: 0 10*3/uL (ref 0.0–0.5)
Eosinophils Relative: 0 %
HCT: 31.5 % — ABNORMAL LOW (ref 39.0–52.0)
Hemoglobin: 10 g/dL — ABNORMAL LOW (ref 13.0–17.0)
Immature Granulocytes: 0 %
Lymphocytes Relative: 4 %
Lymphs Abs: 0.6 10*3/uL — ABNORMAL LOW (ref 0.7–4.0)
MCH: 30.8 pg (ref 26.0–34.0)
MCHC: 31.7 g/dL (ref 30.0–36.0)
MCV: 96.9 fL (ref 80.0–100.0)
Monocytes Absolute: 1.4 10*3/uL — ABNORMAL HIGH (ref 0.1–1.0)
Monocytes Relative: 9 %
Neutro Abs: 13.6 10*3/uL — ABNORMAL HIGH (ref 1.7–7.7)
Neutrophils Relative %: 87 %
Platelets: 180 10*3/uL (ref 150–400)
RBC: 3.25 MIL/uL — ABNORMAL LOW (ref 4.22–5.81)
RDW: 19.6 % — ABNORMAL HIGH (ref 11.5–15.5)
WBC: 15.7 10*3/uL — ABNORMAL HIGH (ref 4.0–10.5)
nRBC: 0 % (ref 0.0–0.2)

## 2019-02-10 LAB — HEMOGLOBIN A1C
Hgb A1c MFr Bld: 6.2 % — ABNORMAL HIGH (ref 4.8–5.6)
Mean Plasma Glucose: 131.24 mg/dL

## 2019-02-10 LAB — PROTIME-INR
INR: 1.5 — ABNORMAL HIGH (ref 0.8–1.2)
Prothrombin Time: 18.1 seconds — ABNORMAL HIGH (ref 11.4–15.2)

## 2019-02-10 LAB — SARS CORONAVIRUS 2 BY RT PCR (HOSPITAL ORDER, PERFORMED IN ~~LOC~~ HOSPITAL LAB): SARS Coronavirus 2: NEGATIVE

## 2019-02-10 LAB — COMPREHENSIVE METABOLIC PANEL
ALT: 18 U/L (ref 0–44)
AST: 30 U/L (ref 15–41)
Albumin: 3.3 g/dL — ABNORMAL LOW (ref 3.5–5.0)
Alkaline Phosphatase: 269 U/L — ABNORMAL HIGH (ref 38–126)
Anion gap: 19 — ABNORMAL HIGH (ref 5–15)
BUN: 67 mg/dL — ABNORMAL HIGH (ref 8–23)
CO2: 25 mmol/L (ref 22–32)
Calcium: 9.4 mg/dL (ref 8.9–10.3)
Chloride: 89 mmol/L — ABNORMAL LOW (ref 98–111)
Creatinine, Ser: 8.78 mg/dL — ABNORMAL HIGH (ref 0.61–1.24)
GFR calc Af Amer: 6 mL/min — ABNORMAL LOW (ref >60)
GFR calc non Af Amer: 5 mL/min — ABNORMAL LOW (ref >60)
Glucose, Bld: 132 mg/dL — ABNORMAL HIGH (ref 70–99)
Potassium: 3.6 mmol/L (ref 3.5–5.1)
Sodium: 133 mmol/L — ABNORMAL LOW (ref 135–145)
Total Bilirubin: 1.2 mg/dL (ref 0.3–1.2)
Total Protein: 7.9 g/dL (ref 6.5–8.1)

## 2019-02-10 LAB — CK: Total CK: 198 U/L (ref 49–397)

## 2019-02-10 LAB — APTT: aPTT: 42 seconds — ABNORMAL HIGH (ref 24–36)

## 2019-02-10 LAB — LACTIC ACID, PLASMA: Lactic Acid, Venous: 2 mmol/L (ref 0.5–1.9)

## 2019-02-10 MED ORDER — FERRIC CITRATE 1 GM 210 MG(FE) PO TABS
420.0000 mg | ORAL_TABLET | Freq: Three times a day (TID) | ORAL | Status: DC
  Administered 2019-02-10 – 2019-02-14 (×9): 420 mg via ORAL
  Filled 2019-02-10 (×12): qty 2

## 2019-02-10 MED ORDER — SORBITOL 70 % SOLN: 30.0000 mL | Status: DC | PRN

## 2019-02-10 MED ORDER — SEVELAMER CARBONATE 800 MG PO TABS
2400.0000 mg | ORAL_TABLET | Freq: Three times a day (TID) | ORAL | Status: DC
  Administered 2019-02-10 – 2019-02-13 (×8): 2400 mg via ORAL
  Filled 2019-02-10 (×10): qty 3

## 2019-02-10 MED ORDER — MIDODRINE HCL 5 MG PO TABS
5.0000 mg | ORAL_TABLET | Freq: Two times a day (BID) | ORAL | Status: DC
  Administered 2019-02-11 – 2019-02-20 (×18): 5 mg via ORAL
  Filled 2019-02-10 (×21): qty 1

## 2019-02-10 MED ORDER — CALCIUM CARBONATE ANTACID 1250 MG/5ML PO SUSP
500.0000 mg | Freq: Four times a day (QID) | ORAL | Status: DC | PRN
  Filled 2019-02-10: qty 5

## 2019-02-10 MED ORDER — ONDANSETRON HCL 4 MG PO TABS
4.0000 mg | ORAL_TABLET | Freq: Four times a day (QID) | ORAL | Status: DC | PRN
  Administered 2019-02-20: 4 mg via ORAL
  Filled 2019-02-10: qty 1

## 2019-02-10 MED ORDER — VANCOMYCIN HCL IN DEXTROSE 1-5 GM/200ML-% IV SOLN
1000.0000 mg | INTRAVENOUS | Status: DC
  Filled 2019-02-10: qty 200

## 2019-02-10 MED ORDER — SODIUM CHLORIDE 0.9 % IV SOLN
INTRAVENOUS | Status: DC | PRN
  Administered 2019-02-10: 250 mL via INTRAVENOUS

## 2019-02-10 MED ORDER — PIPERACILLIN-TAZOBACTAM 3.375 G IVPB
3.3750 g | Freq: Two times a day (BID) | INTRAVENOUS | Status: DC
  Administered 2019-02-11 – 2019-02-16 (×11): 3.375 g via INTRAVENOUS
  Filled 2019-02-10 (×11): qty 50

## 2019-02-10 MED ORDER — ACETAMINOPHEN 650 MG RE SUPP: 650.0000 mg | Freq: Four times a day (QID) | RECTAL | Status: DC | PRN

## 2019-02-10 MED ORDER — ONDANSETRON HCL 4 MG/2ML IJ SOLN
4.0000 mg | Freq: Four times a day (QID) | INTRAMUSCULAR | Status: DC | PRN
  Filled 2019-02-10: qty 2

## 2019-02-10 MED ORDER — ATORVASTATIN CALCIUM 10 MG PO TABS
10.0000 mg | ORAL_TABLET | Freq: Every day | ORAL | Status: DC
  Administered 2019-02-11 – 2019-02-20 (×10): 10 mg via ORAL
  Filled 2019-02-10 (×10): qty 1

## 2019-02-10 MED ORDER — CEFAZOLIN SODIUM-DEXTROSE 1-4 GM/50ML-% IV SOLN
1.0000 g | Freq: Once | INTRAVENOUS | Status: AC
  Administered 2019-02-10: 1 g via INTRAVENOUS
  Filled 2019-02-10: qty 50

## 2019-02-10 MED ORDER — PIPERACILLIN-TAZOBACTAM 3.375 G IVPB 30 MIN
3.3750 g | Freq: Once | INTRAVENOUS | Status: AC
  Administered 2019-02-10: 19:00:00 3.375 g via INTRAVENOUS
  Filled 2019-02-10: qty 50

## 2019-02-10 MED ORDER — RENA-VITE PO TABS
1.0000 | ORAL_TABLET | Freq: Every day | ORAL | Status: DC
  Administered 2019-02-10: 1 via ORAL
  Filled 2019-02-10 (×3): qty 1

## 2019-02-10 MED ORDER — MIDODRINE HCL 5 MG PO TABS
10.0000 mg | ORAL_TABLET | ORAL | Status: DC
  Administered 2019-02-13: 10 mg via ORAL
  Administered 2019-02-15: 07:00:00 5 mg via ORAL
  Administered 2019-02-15 – 2019-02-20 (×3): 10 mg via ORAL
  Filled 2019-02-10 (×6): qty 2

## 2019-02-10 MED ORDER — PANTOPRAZOLE SODIUM 40 MG PO TBEC
40.0000 mg | DELAYED_RELEASE_TABLET | Freq: Every day | ORAL | Status: DC
  Administered 2019-02-11 – 2019-02-20 (×10): 40 mg via ORAL
  Filled 2019-02-10 (×10): qty 1

## 2019-02-10 MED ORDER — VANCOMYCIN HCL 10 G IV SOLR
2000.0000 mg | Freq: Once | INTRAVENOUS | Status: AC
  Administered 2019-02-10: 2000 mg via INTRAVENOUS
  Filled 2019-02-10: qty 2000

## 2019-02-10 MED ORDER — DOCUSATE SODIUM 283 MG RE ENEM
1.0000 | ENEMA | RECTAL | Status: DC | PRN
  Filled 2019-02-10: qty 1

## 2019-02-10 MED ORDER — HEPARIN BOLUS VIA INFUSION
5550.0000 [IU] | Freq: Once | INTRAVENOUS | Status: AC
  Administered 2019-02-10: 5550 [IU] via INTRAVENOUS
  Filled 2019-02-10: qty 5550

## 2019-02-10 MED ORDER — ZOLPIDEM TARTRATE 5 MG PO TABS
5.0000 mg | ORAL_TABLET | Freq: Every evening | ORAL | Status: DC | PRN
  Administered 2019-02-14 – 2019-02-15 (×2): 5 mg via ORAL
  Filled 2019-02-10 (×2): qty 1

## 2019-02-10 MED ORDER — ACETAMINOPHEN 325 MG PO TABS
650.0000 mg | ORAL_TABLET | Freq: Four times a day (QID) | ORAL | Status: DC | PRN
  Administered 2019-02-13 – 2019-02-18 (×6): 650 mg via ORAL
  Filled 2019-02-10 (×6): qty 2

## 2019-02-10 MED ORDER — NEPRO/CARBSTEADY PO LIQD: 237.0000 mL | Freq: Three times a day (TID) | ORAL | Status: DC | PRN

## 2019-02-10 MED ORDER — HYDROXYZINE HCL 25 MG PO TABS
25.0000 mg | ORAL_TABLET | Freq: Three times a day (TID) | ORAL | Status: DC | PRN
  Administered 2019-02-16 – 2019-02-19 (×2): 25 mg via ORAL
  Filled 2019-02-10 (×3): qty 1

## 2019-02-10 MED ORDER — HEPARIN (PORCINE) 25000 UT/250ML-% IV SOLN
2400.0000 [IU]/h | INTRAVENOUS | Status: DC
  Administered 2019-02-10: 1600 [IU]/h via INTRAVENOUS
  Administered 2019-02-11: 2200 [IU]/h via INTRAVENOUS
  Administered 2019-02-11: 1600 [IU]/h via INTRAVENOUS
  Administered 2019-02-12 (×2): 2350 [IU]/h via INTRAVENOUS
  Administered 2019-02-13: 2400 [IU]/h via INTRAVENOUS
  Filled 2019-02-10 (×9): qty 250

## 2019-02-10 NOTE — ED Provider Notes (Signed)
[facility] DEPART[facility] Provider Note   CSN: [id number] Arrival date & time: 02/08/2019  3382     History   Chief Complaint Chief Complaint  Patient presents with   Fall    HPI YERIEL MINEO is a 78 y.o. male.     Patient is a 78 year old male with multiple medical problems including end-stage renal disease on dialysis Tuesday Thursday Saturday, hypertension, CHF, atrial fibrillation on Coumadin, recurrent zoster and recent diagnosis of new seizures on Keppra who was discharged from a skilled facility last week and has been at home with someone who comes and checks on him during the day.  Patient had a fall last night when going to the bathroom.  He states he went to the bathroom with his walker and his walker would not lock and when he went to sit down on the toilet he fell directly on his buttocks.  He was unable to get up and was there for approximately 5 hours until a family member came to check on him.  He does not feel like he injured anything in the fall and denies any new pain.  Patient denies hitting his head or loss of consciousness.  He denies having any tremor or seizure-like activity.  He does complain of ongoing issues with his left lower extremity and currently a nurse is coming out weekly to wrap his leg.  He states the wound is not getting any better and based on office notes he was getting referral to the wound center in [place].  Denies any recent medication changes and is not currently on any antibiotics.  Patient does note over the last few days he has had some redness to his right inner thigh and states it was related to tight fitting shorts.  He denies any testicular discomfort  The history is provided by the patient.  Fall This is a recurrent problem. The current episode started 3 to 5 hours ago. The problem occurs constantly. The problem has not changed since onset.Associated symptoms comments: Felt weak yesterday but denies any new pain, abd  pain, chest pain, SOB, N/V or diarrhea. Nothing aggravates the symptoms. Nothing relieves the symptoms.    Past Medical History:  Diagnosis Date   Acute blood loss anemia    Atrial fibrillation (HCC)    Chronic   Chronic combined systolic and diastolic heart failure (HCC)    Colon polyp 2000   Dysrhythmia    hx   ESRD on hemodialysis ([place])    Hemo TTHS- [place]   Essential hypertension    Gastritis and gastroduodenitis    with bleeding   Gout    Heart murmur    Herpes    History of blood transfusion    Nephrolithiasis    OSA (obstructive sleep apnea) 09/02/2013    IMPRESSION :  1. Mild obstructive sleep apnea with hypopneas causing sleep fragmentation and moderate oxygen desaturation.  2. Short runs of nonsustained VT were noted. His beta blocker may need to be titrated 3. Significant PLM's were noted, the PLM arousal index was low. Please correlate with clinical history of restless leg syndrome.  4. Sleep efficiency was poor.  RECOMMENDATION:  1. Treatment options for this degree of sleep disordered breathing include weight loss and positional therapy to avoid supine sleep 2. Consider titrating beta blocker further, defer to cardiologist 3. Patient should be cautioned against driving when sleepy.They should be asked to avoid medications with sedative side effects      Osteoarthritis  of right hip    Pneumonia    hx 30 yrs ago   Primary osteoarthritis of right hip    Rheumatoid arthritis ([place])    Thrombocytopenia [facility])     Patient Active Problem List   Diagnosis Date Noted   Seizure ([place]) 12/09/2018   Herpes 12/09/2018   High risk medication use 03/17/2017   Hyperkalemia 08/01/2016   Aftercare following surgery of the circulatory system 06/17/2016   Status post total replacement of right hip 03/22/2016   ESRD on dialysis ([place]) 11/27/2015   OSA (obstructive sleep apnea) 09/02/2013   Chronic combined systolic and diastolic heart failure ([place])  03/16/2013   Allergic rhinitis 12/18/2012   Long term current use of anticoagulant therapy 11/25/2010   Genital herpes 05/31/2010   DM (diabetes mellitus), type 2 with renal complications ([place]) 76/19/5093   ERECTILE DYSFUNCTION, ORGANIC 09/21/2009   Osteoarthritis 09/21/2009   PERSONAL HX COLONIC POLYPS 08/25/2009   Gout 07/22/2009   Essential hypertension 07/22/2009   ATRIAL FIBRILLATION 07/22/2009   Rheumatoid arthritis ([place]) 07/22/2009   NEPHROLITHIASIS, HX OF 07/22/2009    Past Surgical History:  Procedure Laterality Date   AV FISTULA PLACEMENT Left 08/26/2015   Procedure: LEFT RADIOCEPHALIC FISTULA CREATION;  Surgeon: Rosetta Posner, MD;  Location: [facility];  Service: Vascular;  Laterality: Left;   AV FISTULA PLACEMENT Left 11/23/2015   Procedure: ARTERIOVENOUS (AV) FISTULA CREATION;  Surgeon: Rosetta Posner, MD;  Location: [facility];  Service: Vascular;  Laterality: Left;   BACK SURGERY     x2- discectomy   CHOLECYSTECTOMY  1994   CYSTOSCOPY/RETROGRADE/URETEROSCOPY/STONE EXTRACTION WITH BASKET     ESOPHAGOGASTRODUODENOSCOPY N/A 11/13/2015   Procedure: ESOPHAGOGASTRODUODENOSCOPY (EGD);  Surgeon: Irene Shipper, MD;  Location[facility] ENDOSCOPY;  Service: Endoscopy;  Laterality: N/A;   INSERTION OF DIALYSIS CATHETER Left 11/23/2015   Procedure: INSERTION OF DIALYSIS CATHETER;  Surgeon: Rosetta Posner, MD;  Location: [facility];  Service: Vascular;  Laterality: Left;   IR GENERIC HISTORICAL Left 08/04/2016   IR DIALY SHUNT INTRO NEEDLE/INTRACATH INITIAL W/IMG LEFT 08/04/2016 Markus Daft, MD [facility]   IR GENERIC HISTORICAL  08/04/2016   IR US GUIDE VASC ACCESS LEFT 08/04/2016 Markus Daft, MD [facility]   JOINT REPLACEMENT     Total L-Hip replacement, Right Knee 10/20/09   LEFT AND RIGHT HEART CATHETERIZATION WITH CORONARY ANGIOGRAM N/A 02/22/2013   Procedure: LEFT AND RIGHT HEART CATHETERIZATION WITH CORONARY ANGIOGRAM;  Surgeon: Jolaine Artist, MD;  Location: [facility] CATH LAB;  Service:  Cardiovascular;  Laterality: N/A;   LITHOTRIPSY  90's   PERIPHERAL VASCULAR CATHETERIZATION Left 11/19/2015   Procedure: A/V/Fistulagram;  Surgeon: Conrad Bemidji, MD;  Location: [facility] CV LAB;  Service: Cardiovascular;  Laterality: Left;   PERIPHERAL VASCULAR CATHETERIZATION Left 07/21/2016   Procedure: A/V Fistulagram;  Surgeon: Conrad [place], MD;  Location: [facility] CV LAB;  Service: Cardiovascular;  Laterality: Left;  arm   REVISON OF ARTERIOVENOUS FISTULA Left 05/30/2016   Procedure: REVISION LEFT UPPER ARM FISTULA;  Surgeon: Conrad , MD;  Location: [facility];  Service: Vascular;  Laterality: Left;   REVISON OF ARTERIOVENOUS FISTULA Left 07/22/2016   Procedure: REVISON OF BASILIC VEIN TRANSPOSITION ANASTOMOSIS;  Surgeon: Rosetta Posner, MD;  Location: [facility];  Service: Vascular;  Laterality: Left;   SPINE SURGERY     x 2   TOTAL HIP ARTHROPLASTY Right 03/22/2016   Procedure: RIGHT TOTAL HIP ARTHROPLASTY ANTERIOR APPROACH;  Surgeon: Mcarthur Rossetti, MD;  Location:  [facility] OR;  Service: Orthopedics;  Laterality: Right;        Home Medications    Prior to Admission medications   Medication Sig Start Date End Date Taking? Authorizing Provider  atorvastatin (LIPITOR) 10 MG tablet TAKE 1 TABLET(10 MG) BY MOUTH DAILY 12/12/18   Debbrah Alar, NP  B Complex-C-Folic Acid (RENA-VITE RX) 1 MG TABS Take 1 tablet by mouth at bedtime.  02/18/16   [provider]  camphor-menthol Timoteo Ace) lotion Apply 1 application topically every 8 (eight) hours as needed for itching. Patient not taking: Reported on 01/08/2019 12/13/18   Cristal Ford, DO  colchicine 0.6 MG tablet Take 1/2 tablet by mouth once as needed for gout flare. Call if no improvement 01/02/18   Debbrah Alar, NP  ferric citrate (AURYXIA) 1 GM 210 MG(Fe) tablet Take 2 tablets (420 mg total) by mouth 3 (three) times daily with meals. 12/13/18   Cristal Ford, DO  hydrOXYzine (ATARAX/VISTARIL) 25 MG tablet Take 1  tablet (25 mg total) by mouth every 8 (eight) hours as needed for itching. 12/13/18   Cristal Ford, DO  levETIRAcetam (KEPPRA) 500 MG tablet Take 1 tablet by mouth once daily after dialysis (only take on dialysis days) 01/10/19   Debbrah Alar, NP  midodrine (PROAMATINE) 10 MG tablet Take 1 tablet (10 mg total) by mouth every Monday, Wednesday, and Friday with hemodialysis. 12/13/18   Mikhail, Velta Addison, DO  midodrine (PROAMATINE) 5 MG tablet Take 1 tablet (5 mg total) by mouth 2 (two) times daily with a meal. 12/13/18   Mikhail, Velta Addison, DO  mupirocin cream (BACTROBAN) 2 % Apply topically daily. Apply Bactroban to buttock lesions Q day, may leave open to air Patient not taking: Reported on 01/08/2019 12/14/18   Cristal Ford, DO  Nutritional Supplements (FEEDING SUPPLEMENT, NEPRO CARB STEADY,) LIQD Take 237 mLs by mouth 3 (three) times daily as needed (Supplement). Patient not taking: Reported on 01/08/2019 12/13/18   Cristal Ford, DO  pantoprazole (PROTONIX) 40 MG tablet Take 1 tablet (40 mg total) by mouth daily. 02/07/19   Debbrah Alar, NP  RENVELA 800 MG tablet Take 3 tablets (2,400 mg total) by mouth 3 (three) times daily with meals. 07/23/16   Lavina Hamman, MD  SANTYL ointment Apply 1 application topically daily. 01/17/19 02/16/19  Debbrah Alar, NP  warfarin (COUMADIN) 5 MG tablet TK 1 T PO QD 12/31/18   [provider]    Family History Family History  Problem Relation Age of Onset   Hypertension Mother    Arthritis Mother        ?RA   Hypertension Father     Social History Social History   Tobacco Use   Smoking status: Never Smoker   Smokeless tobacco: Never Used  Substance Use Topics   Alcohol use: No    Comment: occasional   Drug use: No     Allergies   Neurontin [gabapentin] and Ace inhibitors   Review of Systems Review of Systems  All other systems reviewed and are negative.    Physical Exam Updated Vital Signs BP (!) 99/56  (BP Location: Right Arm)    Pulse (!) 59    Temp 99.2 F (37.3 C) (Oral)    Resp (!) 30    SpO2 96%   Physical Exam Vitals signs and nursing note reviewed.  Constitutional:      General: He is not in acute distress.    Appearance: He is well-developed.  HENT:     Head: Normocephalic and  atraumatic.  Eyes:     Conjunctiva/sclera: Conjunctivae normal.     Pupils: Pupils are equal, round, and reactive to light.  Neck:     Musculoskeletal: Normal range of motion and neck supple.  Cardiovascular:     Rate and Rhythm: Normal rate and regular rhythm.     Pulses: Normal pulses.     Heart sounds: Murmur present.  Pulmonary:     Effort: Pulmonary effort is normal. No respiratory distress.     Breath sounds: Normal breath sounds. No wheezing or rales.  Abdominal:     General: There is no distension.     Palpations: Abdomen is soft.     Tenderness: There is no abdominal tenderness. There is no guarding or rebound.  Genitourinary:    Penis: Normal.      Scrotum/Testes: Normal.  Musculoskeletal: Normal range of motion.        General: No tenderness.     Right lower leg: Edema present.     Left lower leg: Edema present.     Comments: Erythema present on the right inner thigh with some mild tenderness.  No induration or fluctuance.  Graft present in the left upper arm with palpable pulse  Skin:    General: Skin is warm and dry.     Findings: No erythema or rash.     Comments: Large wound present over the left calf area with eschar over the calf and minimal erythema.  No drainage and it is not circumferential.  Wound present on the right buttocks which is covered and has no surrounding erythema or induration  Neurological:     General: No focal deficit present.     Mental Status: He is alert and oriented to person, place, and time. Mental status is at baseline.  Psychiatric:        Mood and Affect: Mood normal.        Behavior: Behavior normal.        Thought Content: Thought content  normal.      ED Treatments / Results  Labs (all labs ordered are listed, but only abnormal results are displayed) Labs Reviewed  LACTIC ACID, PLASMA - Abnormal; Notable for the following components:      Result Value   Lactic Acid, Venous 2.0 (*)    All other components within normal limits  COMPREHENSIVE METABOLIC PANEL - Abnormal; Notable for the following components:   Sodium 133 (*)    Chloride 89 (*)    Glucose, Bld 132 (*)    BUN 67 (*)    Creatinine, Ser 8.78 (*)    Albumin 3.3 (*)    Alkaline Phosphatase 269 (*)    GFR calc non Af Amer 5 (*)    GFR calc Af Amer 6 (*)    Anion gap 19 (*)    All other components within normal limits  CBC WITH DIFFERENTIAL/PLATELET - Abnormal; Notable for the following components:   WBC 15.7 (*)    RBC 3.25 (*)    Hemoglobin 10.0 (*)    HCT 31.5 (*)    RDW 19.6 (*)    Neutro Abs 13.6 (*)    Lymphs Abs 0.6 (*)    Monocytes Absolute 1.4 (*)    All other components within normal limits  PROTIME-INR - Abnormal; Notable for the following components:   Prothrombin Time 18.1 (*)    INR 1.5 (*)    All other components within normal limits  APTT - Abnormal; Notable for the following components:  aPTT 42 (*)    All other components within normal limits  CULTURE, BLOOD (ROUTINE X 2)  CULTURE, BLOOD (ROUTINE X 2)  SARS CORONAVIRUS 2 (HOSPITAL ORDER, [facility])  CK  LACTIC ACID, PLASMA  HEMOGLOBIN A1C    EKG EKG Interpretation  Date/Time:  Sunday February 10 2019 09:21:06 EDT Ventricular Rate:  58 PR Interval:    QRS Duration: 119 QT Interval:  565 QTC Calculation: 556 R Axis:   -73 Text Interpretation:  Atrial fibrillation Nonspecific IVCD with LAD Inferior infarct, old Anterolateral infarct, age indeterminate No significant change since last tracing Confirmed by Blanchie Dessert [phone]) on 02/15/2019 9:48:37 AM   Radiology Dg Chest Port 1 View  Result Date: 01/23/2019 CLINICAL DATA:  78 year old  male with history of unwitnessed fall today. Generalized fatigue. EXAM: PORTABLE CHEST 1 VIEW COMPARISON:  Chest x-ray 12/09/2018. FINDINGS: Lung volumes are normal. No consolidative airspace disease. No pleural effusions. No evidence of pulmonary edema. Moderate cardiomegaly (unchanged). Upper mediastinal contours are within normal limits. Aortic atherosclerosis. IMPRESSION: 1.  No radiographic evidence of acute cardiopulmonary disease. 2. Moderate cardiomegaly (unchanged). 3. Aortic atherosclerosis. Electronically Signed   By: Vinnie Langton M.D.   On: 01/28/2019 09:53    Procedures Procedures (including critical care time)  Medications Ordered in ED Medications - No data to display   Initial Impression / Assessment and Plan / ED Course  I have reviewed the triage vital signs and the nursing notes.  Pertinent labs & imaging results that were available during my care of the patient were reviewed by me and considered in my medical decision making (see chart for details).       Elderly male with multiple medical problems here today after a fall at home around 3 AM when his walker would not lock and he fell on his buttocks while trying to sit on the toilet.  He was on the floor for approximately 5 hours until family came to check on him like they always do.  He denies hitting his head or loss of consciousness.  Patient does take Coumadin.  Patient has been out of rehab he states for approximately 1 week.  He did receive dialysis yesterday but states he was feeling generally weak.  Also he is noted to have some erythema to his right inner thigh which he states started several days ago.  He denies any fever, vomiting or diarrhea.  He is not currently on antibiotics.  He has recently been diagnosed with seizures in the last few months and has been on Keppra but denies any seizure-like activity today.  Patient based on last hospitalization always has blood pressures that are soft in the high 90s and  low 100s.  He is able to range both legs with low suspicion for pelvic injury.  He does have a large wound on his left lower extremity that needs wound care but does not appear to be an infectious source today.  He appears fluid overloaded with lower extremity swelling and some tachypnea.  Will check labs, INR, EKG.  Will discuss with family as patient states that he thinks he needs more help at home.  12:06 PM Spoke with the patient's daughter who states he has been out of rehab since 01/02/2019 and he had been doing okay until this week.  Patient has had 3 falls this week requiring someone to come and help him get up.  Patient has complained of feeling weak and now with a leukocytosis of  15,000 new evidence of cellulitis on his inner thigh and worsening wound on his right lower extremity feel that he needs antibiotics and hospitalization.  Patient was given cefazolin as he has been MRSA negative in the past.  Daughter is also requesting case management contact her as patient may need a higher level of care once he is discharged.  Blood pressure has remained stable in the low 100s and patient does take Midrin for chronic hypotension.  Also daughter notes that in the last week or so they have started weaning his Keppra because his EEG had been normal and she is wondering if that has anything to do with it.  Patient has not had any seizure-like activity here and none witnessed by family. Final Clinical Impressions(s) / ED Diagnoses   Final diagnoses:  Open wound of left lower extremity, initial encounter  Cellulitis of right lower extremity  Fall in home, initial encounter    ED Discharge Orders    None       Blanchie Dessert, MD 01/18/2019 1410

## 2019-02-10 NOTE — ED Notes (Signed)
[facility] arrived to transport pt

## 2019-02-10 NOTE — ED Triage Notes (Signed)
Per EMS, pt fell while using the restroom at 3am and laid in the floor until EMS was called this morning when family came by. He landed on his R side. He denies injury from the fall. C/o generalized weakness and fatigue.

## 2019-02-10 NOTE — H&P (Signed)
History and Physical    BAUER AUSBORN OFB:[id number] DOB: 1940/08/25 DOA: 01/20/2019  PCP: Debbrah Alar, NP Consultants:  Delice Lesch - neurology; Moshe Cipro - nephrology; Bensimhon - cardiology; Ninfa Linden - orthopedics Patient coming from:  Home - lives alone; has a nurse coming in in the mornings and son/grandchildren check on him in the evening; NOK: Daughter, [name]  Chief Complaint: Fall  HPI: Bruce Cole is a 78 y.o. male with medical history significant of ESRD on MWF HD; hypertension; CHF; A. fib on Coumadin; chronic zoster on Valtrex; chronic combined CHF; and recent diagnosis of seizures presenting with a fall.  He got up with his walker and was transferring to the commode.  The walker slid and he fell and could not get up.  He did not injure himself (other than his pride) but was unable to call for help.  His aide came to check on him about 730 and found him there.  During his prior admission, he fell on his hip and was complaining about pain.  He was discharged to [facility] rehab and a scar developed on the back of his leg.  No fevers.  He has not felt ill.  He gets his temperature checked routinely at HD.  His last HD was Friday and uncomplicated.   ED Course:  Transfer from [facility], per Dr. Earnest Conroy:  78 year old male with ESRD (TTS), hypertension, CHF, A. fib on Coumadin, chronic zoster on Valtrex who was admitted to this [facility] in May for falls/possible seizures and started on Keppra by neurology-> discharged to acute rehab-> home on 6/17 with home health now presents with generalized weakness, recurrent falls x3 over the last week, nonhealing old left calf wound as well as new right thigh cellulitis with labs showing WBC 15 K, T-max 71F. Patient apparently felt weak and laid on the bathroom floor for 5 hours until family member found him. Neuro has been tapering Keppra dosage lately and family feels patient has declined again since then. Patient has chronic  hypotension with baseline systolic in 78E to 423N on Midodrin. Patient started on IV cefazolin at [facility] and requested to be admitted for further management. May need ALF at discharge as he lives alone   Review of Systems: As per HPI; otherwise review of systems reviewed and negative.   Ambulatory Status:  Ambulates with a walker  Past Medical History:  Diagnosis Date  . Acute blood loss anemia   . Atrial fibrillation (HCC)    Chronic  . Chronic combined systolic and diastolic heart failure ([place])   . Colon polyp 2000  . Dysrhythmia    hx  . ESRD on hemodialysis ([place])    [place]  . Essential hypertension   . Gastritis and gastroduodenitis    with bleeding  . Gout   . Heart murmur   . Herpes   . History of blood transfusion   . Nephrolithiasis   . OSA (obstructive sleep apnea) 09/02/2013    IMPRESSION :  1. Mild obstructive sleep apnea with hypopneas causing sleep fragmentation and moderate oxygen desaturation.  2. Short runs of nonsustained VT were noted. His beta blocker may need to be titrated 3. Significant PLM's were noted, the PLM arousal index was low. Please correlate with clinical history of restless leg syndrome.  4. Sleep efficiency was poor.  RECOMMENDATION:  1. Treatment options for this degree of sleep disordered breathing include weight loss and positional therapy to avoid supine sleep 2. Consider titrating beta blocker further, defer  to cardiologist 3. Patient should be cautioned against driving when sleepy.They should be asked to avoid medications with sedative side effects     . Osteoarthritis of right hip   . Pneumonia    hx 30 yrs ago  . Primary osteoarthritis of right hip   . Rheumatoid arthritis ([place])   . Seizures ([place])   . Thrombocytopenia ([place])     Past Surgical History:  Procedure Laterality Date  . AV FISTULA PLACEMENT Left 08/26/2015   Procedure: LEFT RADIOCEPHALIC FISTULA CREATION;  Surgeon: Rosetta Posner, MD;  Location: [place];   Service: Vascular;  Laterality: Left;  . AV FISTULA PLACEMENT Left 11/23/2015   Procedure: ARTERIOVENOUS (AV) FISTULA CREATION;  Surgeon: Rosetta Posner, MD;  Location: [place];  Service: Vascular;  Laterality: Left;  . BACK SURGERY     x2- discectomy  . CHOLECYSTECTOMY  1994  . CYSTOSCOPY/RETROGRADE/URETEROSCOPY/STONE EXTRACTION WITH BASKET    . ESOPHAGOGASTRODUODENOSCOPY N/A 11/13/2015   Procedure: ESOPHAGOGASTRODUODENOSCOPY (EGD);  Surgeon: Irene Shipper, MD;  Location: [facility] ENDOSCOPY;  Service: Endoscopy;  Laterality: N/A;  . INSERTION OF DIALYSIS CATHETER Left 11/23/2015   Procedure: INSERTION OF DIALYSIS CATHETER;  Surgeon: Rosetta Posner, MD;  Location: [place];  Service: Vascular;  Laterality: Left;  . IR GENERIC HISTORICAL Left 08/04/2016   IR DIALY SHUNT INTRO NEEDLE/INTRACATH INITIAL W/IMG LEFT 08/04/2016 Markus Daft, MD [facility]-INTERV RAD  . IR GENERIC HISTORICAL  08/04/2016   IR US GUIDE VASC ACCESS LEFT 08/04/2016 Markus Daft, MD [facility]INTER[facility]  . JOINT REPLACEMENT     Total L-Hip replacement, Right Knee 10/20/09  . LEFT AND RIGHT HEART CATHETERIZATION WITH CORONARY ANGIOGRAM N/A 02/22/2013   Procedure: LEFT AND RIGHT HEART CATHETERIZATION WITH CORONARY ANGIOGRAM;  Surgeon: Jolaine Artist, MD;  Location: [facility] CATH LAB;  Service: Cardiovascular;  Laterality: N/A;  . LITHOTRIPSY  90's  . PERIPHERAL VASCULAR CATHETERIZATION Left 11/19/2015   Procedure: A/V/Fistulagram;  Surgeon: Conrad Barnum, MD;  Location: [place] CV LAB;  Service: Cardiovascular;  Laterality: Left;  . PERIPHERAL VASCULAR CATHETERIZATION Left 07/21/2016   Procedure: A/V Fistulagram;  Surgeon: Conrad St. Jacob, MD;  Location: [place] CV LAB;  Service: Cardiovascular;  Laterality: Left;  arm  . REVISON OF ARTERIOVENOUS FISTULA Left 05/30/2016   Procedure: REVISION LEFT UPPER ARM FISTULA;  Surgeon: Conrad , MD;  Location: [place];  Service: Vascular;  Laterality: Left;  . REVISON OF ARTERIOVENOUS FISTULA Left 07/22/2016   Procedure: REVISON OF  BASILIC VEIN TRANSPOSITION ANASTOMOSIS;  Surgeon: Rosetta Posner, MD;  Location: [place];  Service: Vascular;  Laterality: Left;  . SPINE SURGERY     x 2  . TOTAL HIP ARTHROPLASTY Right 03/22/2016   Procedure: RIGHT TOTAL HIP ARTHROPLASTY ANTERIOR APPROACH;  Surgeon: Mcarthur Rossetti, MD;  Location: [place];  Service: Orthopedics;  Laterality: Right;    Social History   Socioeconomic History  . Marital status: Single    Spouse name: Not on file  . Number of children: 3  . Years of education: Not on file  . Highest education level: Not on file  Occupational History  . Occupation: retired Personnel officer: RETIRED  Social Needs  . Financial resource strain: Not on file  . Food insecurity    Worry: Not on file    Inability: Not on file  . Transportation needs    Medical: Not on file    Non-medical: Not on file  Tobacco Use  . Smoking  status: Never Smoker  . Smokeless tobacco: Never Used  Substance and Sexual Activity  . Alcohol use: No    Comment: occasional  . Drug use: No  . Sexual activity: Not on file  Lifestyle  . Physical activity    Days per week: Not on file    Minutes per session: Not on file  . Stress: Not on file  Relationships  . Social Herbalist on phone: Not on file    Gets together: Not on file    Attends religious service: Not on file    Active member of club or organization: Not on file    Attends meetings of clubs or organizations: Not on file    Relationship status: Not on file  . Intimate partner violence    Fear of current or ex partner: Not on file    Emotionally abused: Not on file    Physically abused: Not on file    Forced sexual activity: Not on file  Other Topics Concern  . Not on file  Social History Narrative   Former [employer] [place]   Admitted to [place] 11/25/15   Widowed   Never smoked   FULL CODE   Right handed    2 level home stays on 1st level     Allergies  Allergen Reactions   . Neurontin [Gabapentin] Other (See Comments)    Encephalopathy with myoclonic jerking  . Ace Inhibitors Other (See Comments)    Worsening renal insufficiency    Family History  Problem Relation Age of Onset  . Hypertension Mother   . Arthritis Mother        ?RA  . Hypertension Father     Prior to Admission medications   Medication Sig Start Date End Date Taking? Authorizing Provider  atorvastatin (LIPITOR) 10 MG tablet TAKE 1 TABLET(10 MG) BY MOUTH DAILY 12/12/18   Debbrah Alar, NP  B Complex-C-Folic Acid (RENA-VITE RX) 1 MG TABS Take 1 tablet by mouth at bedtime.  02/18/16   [provider]  camphor-menthol [place] Ace) lotion Apply 1 application topically every 8 (eight) hours as needed for itching. Patient not taking: Reported on 01/08/2019 12/13/18   Cristal Ford, DO  colchicine 0.6 MG tablet Take 1/2 tablet by mouth once as needed for gout flare. Call if no improvement 01/02/18   Debbrah Alar, NP  ferric citrate (AURYXIA) 1 GM 210 MG(Fe) tablet Take 2 tablets (420 mg total) by mouth 3 (three) times daily with meals. 12/13/18   Cristal Ford, DO  hydrOXYzine (ATARAX/VISTARIL) 25 MG tablet Take 1 tablet (25 mg total) by mouth every 8 (eight) hours as needed for itching. 12/13/18   Cristal Ford, DO  levETIRAcetam (KEPPRA) 500 MG tablet Take 1 tablet by mouth once daily after dialysis (only take on dialysis days) 01/10/19   Debbrah Alar, NP  midodrine (PROAMATINE) 10 MG tablet Take 1 tablet (10 mg total) by mouth every Monday, Wednesday, and Friday with hemodialysis. 12/13/18   Mikhail, Velta Addison, DO  midodrine (PROAMATINE) 5 MG tablet Take 1 tablet (5 mg total) by mouth 2 (two) times daily with a meal. 12/13/18   Mikhail, Velta Addison, DO  mupirocin cream (BACTROBAN) 2 % Apply topically daily. Apply Bactroban to buttock lesions Q day, may leave open to air Patient not taking: Reported on 01/08/2019 12/14/18   Cristal Ford, DO  Nutritional Supplements (FEEDING  SUPPLEMENT, NEPRO CARB STEADY,) LIQD Take 237 mLs by mouth 3 (three) times daily as needed (  Supplement). Patient not taking: Reported on 01/08/2019 12/13/18   Cristal Ford, DO  pantoprazole (PROTONIX) 40 MG tablet Take 1 tablet (40 mg total) by mouth daily. 02/07/19   Debbrah Alar, NP  RENVELA 800 MG tablet Take 3 tablets (2,400 mg total) by mouth 3 (three) times daily with meals. 07/23/16   Lavina Hamman, MD  SANTYL ointment Apply 1 application topically daily. 01/17/19 02/16/19  Debbrah Alar, NP  warfarin (COUMADIN) 5 MG tablet TK 1 T PO QD 12/31/18   [provider]    Physical Exam: Vitals:   01/16/2019 1400 01/23/2019 1430 02/06/2019 1633 02/02/2019 1652  BP: (!) 101/50 (!) 95/56 (!) 90/55   Pulse: (!) 48 (!) 54 (!) 51 (!) 53  Resp: (!) 23 (!) 28 (!) 26 (!) 28  Temp:   98.3 F (36.8 C)   TempSrc:   Oral   SpO2: 99% 100% 90% 100%  Weight:   125.1 kg   Height:   6\' 3"  (1.905 m)      . General:  Appears calm and comfortable and is NAD . Eyes:  EOMI, normal lids, iris . ENT:  grossly normal hearing, lips & tongue, mmm . Neck:  no LAD, masses or thyromegaly . Cardiovascular:  Irregularly irregular, no m/r/g . Respiratory:   CTA bilaterally with no wheezes/rales/rhonchi.  Normal respiratory effort. . Abdomen:  soft, NT, ND, NABS . Back:   normal alignment, no CVAT . Skin:  He has 2 stage 1 pressure ulcers on his right buttock.  He also has a significant area of necrosis on his left posterior calf that is quite foul-smelling with surrounding erythema and somewhat taut edema of most of the LLE.   The RLE has some evidence of milder trauma and mild erythema without frank cellulitis.        . Musculoskeletal:   decreased ROM of the L > R LE which may be due to pain vs. deconditioning, no bony abnormality . Psychiatric:  grossly normal mood and affect, speech fluent and appropriate, AOx3 . Neurologic:  CN 2-12 grossly intact, moves all extremities in coordinated fashion,  sensation intact    Radiological Exams on Admission: Dg Chest Port 1 View  Result Date: 01/27/2019 CLINICAL DATA:  78 year old male with history of unwitnessed fall today. Generalized fatigue. EXAM: PORTABLE CHEST 1 VIEW COMPARISON:  Chest x-ray 12/09/2018. FINDINGS: Lung volumes are normal. No consolidative airspace disease. No pleural effusions. No evidence of pulmonary edema. Moderate cardiomegaly (unchanged). Upper mediastinal contours are within normal limits. Aortic atherosclerosis. IMPRESSION: 1.  No radiographic evidence of acute cardiopulmonary disease. 2. Moderate cardiomegaly (unchanged). 3. Aortic atherosclerosis. Electronically Signed   By: Vinnie Langton M.D.   On: 01/29/2019 09:53    EKG: Independently reviewed.  Afib with rate 58; nonspecific ST changes with no evidence of acute ischemia; NSCSLT   Labs on Admission: I have personally reviewed the available labs and imaging studies at the time of the admission.  Pertinent labs:   Na++ 133 Glucose 132 BUN 67/Creatinine 8.78/GFR 5 Anion gap 19 AP 269 Albumin 3.3 CK 198 Lactate 2.0 WBC 15.7 Hgb 10.0 INR 1.5 A1c 6.2  Assessment/Plan Principal Problem:   Cellulitis Active Problems:   Essential hypertension   ATRIAL FIBRILLATION   DM (diabetes mellitus), type 2 with renal complications (HCC)   Chronic combined systolic and diastolic heart failure (HCC)   ESRD on dialysis ([place])   Seizure ([place])   Cellulitis -Patient with h/o possible RLE cellulitis, treated with doxy -He  reports h/o prior fall with injury to this area, although it was not seen during his prior hospitalization (I admitted him and brief review of subsequent notes does not report this) -Currently with erythema to the entire LEFT lower leg with a foul-smelling area of apparent necrosis along most of the posterior calf -Hemodynamically stable without fever -Patient briefly discussed with Dr. Doreatha Martin and they will see the patient tomorrow to determine  if debridement is necessary -For now, antibiotics with Vanc/Zosyn and prompt surgical debridement. -STAT MRI to determine if more urgent orthopedic evaluation is indicated -Admission in this patient is warranted due to:  -failure of outpatient antibiotic therapy -outpatient IV therapy is not appropriate because of progression of infection on PO antibiotic -high-risk comorbid condition is indicated by poor baseline circulation in the setting of ESRD  Seizure -Followed by neurology with negative EEG -Keppra has been stopped, according to the patient and confirmed with 7/21 telephone encounter by Dr. Delice Lesch  ESRD on HD -Patient on chronic MWF HD -Nephrology prn order set utilized -He does not appear to be volume overloaded or otherwise in need of acute HD -Dr. Jonnie Finner notified that patient will need HD  Afib  -Rate controlled without medication -Coumadin on hold, Heparin per pharmacy ordered  Chronic combined CHF -He does not appear to be taking any medications for this issue at this time, including Lasix  HTN -He does not appear to be taking anti-hypertensive medications at this time -In fact, he has chronic hypotension and takes Midodrine  DM -Last A1c was 6.2 -Appears reasonably controlled without medication -Will follow with fasting labs for now  Note: This patient has been tested and is negative for the novel coronavirus COVID-19.    DVT prophylaxis: Heparin in lieu of Coumadin Code Status:  DNR - confirmed with patient Family Communication: None present; his listed NOK is his son but he requests communication instead with his daughter and does not have her number available at this time Disposition Plan:  Appears likely to need placement Consults called: Orthopedics; CM Admission status: Admit - It is my clinical opinion that admission to INPATIENT is reasonable and necessary because of the expectation that this patient will require hospital care that crosses at least  2 midnights to treat this condition based on the medical complexity of the problems presented.  Given the aforementioned information, the predictability of an adverse outcome is felt to be significant.    Karmen Bongo MD [facility]   How to contact the [facility] Attending or Consulting provider [place] or covering provider during after hours [place], for this patient?  1. Check the care team in [facility] and look for a) attending/consulting TRH provider listed and b) the [facility] team listed 2. Log into [email or web address] and use [place]'s universal password to access. If you do not have the password, please contact the hospital operator. 3. Locate the [facility] provider you are looking for under [facility] and page to a number that you can be directly reached. 4. If you still have difficulty reaching the provider, please page the [facility] (Director on Call) for the Hospitalists listed on amion for assistance.   01/19/2019, 5:47 PM

## 2019-02-10 NOTE — Progress Notes (Signed)
ANTICOAGULATION CONSULT NOTE - Initial Consult  Pharmacy Consult for Heparin dosing Indication: atrial fibrillation  Allergies  Allergen Reactions  . Neurontin [Gabapentin] Other (See Comments)    Encephalopathy with myoclonic jerking  . Ace Inhibitors Other (See Comments)    Worsening renal insufficiency    Patient Measurements: Height: 6\' 3"  (190.5 cm) Weight: 275 lb 12.7 oz (125.1 kg) IBW/kg (Calculated) : 84.5 Heparin Dosing Weight: 111.5  Vital Signs: Temp: 98.3 F (36.8 C) (07/26 1633) Temp Source: Oral (07/26 1633) BP: 90/55 (07/26 1633) Pulse Rate: 53 (07/26 1652)  Labs: Recent Labs    01/20/2019 1000 02/13/2019 1046  HGB 10.0*  --   HCT 31.5*  --   PLT 180  --   APTT  --  42*  LABPROT  --  18.1*  INR  --  1.5*  CREATININE 8.78*  --   CKTOTAL 198  --     Estimated Creatinine Clearance: 10.4 mL/min (A) (by C-G formula based on SCr of 8.78 mg/dL (H)).   Medical History: Past Medical History:  Diagnosis Date  . Acute blood loss anemia   . Atrial fibrillation (HCC)    Chronic  . Chronic combined systolic and diastolic heart failure ([place])   . Colon polyp 2000  . Dysrhythmia    hx  . ESRD on hemodialysis ([place])    [place]  . Essential hypertension   . Gastritis and gastroduodenitis    with bleeding  . Gout   . Heart murmur   . Herpes   . History of blood transfusion   . Nephrolithiasis   . OSA (obstructive sleep apnea) 09/02/2013    IMPRESSION :  1. Mild obstructive sleep apnea with hypopneas causing sleep fragmentation and moderate oxygen desaturation.  2. Short runs of nonsustained VT were noted. His beta blocker may need to be titrated 3. Significant PLM's were noted, the PLM arousal index was low. Please correlate with clinical history of restless leg syndrome.  4. Sleep efficiency was poor.  RECOMMENDATION:  1. Treatment options for this degree of sleep disordered breathing include weight loss and positional therapy to avoid supine  sleep 2. Consider titrating beta blocker further, defer to cardiologist 3. Patient should be cautioned against driving when sleepy.They should be asked to avoid medications with sedative side effects     . Osteoarthritis of right hip   . Pneumonia    hx 30 yrs ago  . Primary osteoarthritis of right hip   . Rheumatoid arthritis ([place])   . Seizures ([place])   . Thrombocytopenia (HCC)     Medications:  Scheduled:  . [START ON 02/11/2019] atorvastatin  10 mg Oral Daily  . ferric citrate  420 mg Oral TID WC  . [START ON 02/11/2019] midodrine  10 mg Oral Q M,W,F-HD  . midodrine  5 mg Oral BID WC  . [START ON 02/11/2019] pantoprazole  40 mg Oral Daily  . Rena-Vite Rx  1 tablet Oral QHS  . sevelamer carbonate  2,400 mg Oral TID WC    Assessment: Pt is a 78 y/o male with PMH of ESRD on HD TuThSat, HTN, CHF, A-fib (on coumadin PTA), recurrent zoster, recurrent falls x3 in last week, RLE cellulitis, and recent diagnosis of new seizures on Keppra who was discharged from a skilled facility last week. Pt presented to [facility] on 07/25 after having a fall while going to the bathroom. Pt was down for approx. 5 hours before he was found down. Pt denies hitting his  head or losing consciousness. He denies having any tremors, seizure like activity, or recent changes in medications. Pt was on warfarin at home PTA with an INR of 1.5. Pharmacy has been consulted to dose heparin while warfarin is on hold pending possible LP. Hg/Hct are slightly low at 10/31.5 but stable. Plts WNLs.  Goal of Therapy:  Heparin level 0.3-0.7 units/ml Monitor platelets by anticoagulation protocol: Yes   Plan:  Give heparin bolus of 5,550 IV X 1  Start heparin infusion at 1600 units/hr Check HL in 8 hours to see if heparin gtt is therapeutic. Continue to monitor daily CBC and HL  Sherren Kerns, PharmD PGY1 Acute Care Pharmacy Resident [phone] 01/30/2019,5:50 PM

## 2019-02-10 NOTE — ED Notes (Signed)
Report to Levada Dy, Therapist, sports at [facility]

## 2019-02-10 NOTE — Progress Notes (Signed)
Pharmacy Antibiotic Note  Bruce Cole is a 78 y.o. male admitted on 02/01/2019 with cellulitis.  Pharmacy has been consulted for vancomycin/zosyn dosing. Pt is ESRD on HD MWF. Pt has history of RLE cellulitis. Currently he has erythema to the entire left lower leg with a foul-smelling area of apparent necrosis along most of the posterior calf. He is hemodynamically stable and has no fever. WBC is slightly elevated. Pt will be seen tomorrow to determine if debridement is necessary.  Plan: Vancomycin 2000 mg X 1 IV as loading dose. Give Vancomycin 1000 mg with dialysis Zosyn 3.375g IV q12h (4 hour infusion)  Monitor HD schedule, cultures, WBC, and clinical status of patient.  Height: 6\' 3"  (190.5 cm) Weight: 275 lb 12.7 oz (125.1 kg) IBW/kg (Calculated) : 84.5  Temp (24hrs), Avg:99.1 F (37.3 C), Min:98.3 F (36.8 C), Max:99.8 F (37.7 C)  Recent Labs  Lab 02/15/2019 1000  WBC 15.7*  CREATININE 8.78*  LATICACIDVEN 2.0*    Estimated Creatinine Clearance: 10.4 mL/min (A) (by C-G formula based on SCr of 8.78 mg/dL (H)).    Allergies  Allergen Reactions  . Neurontin [Gabapentin] Other (See Comments)    Encephalopathy with myoclonic jerking  . Ace Inhibitors Other (See Comments)    Worsening renal insufficiency    Antimicrobials this admission: Vancomycin 07/26 >> Zosyn 07/26 >>   Microbiology results: 07/26 BCx: In process 07/26 COVID: Negative  Thank you for allowing pharmacy to be a part of this patient's care.  Sherren Kerns, PharmD PGY1 Acute Care Pharmacy Resident [phone] 02/15/2019 6:27 PM

## 2019-02-10 NOTE — ED Notes (Signed)
Date and time results received: 02/08/2019 1052   Test: lactic Critical Value:2.0 Name of Provider Notified: [place]  Orders Received? Or Actions Taken?: no orders given

## 2019-02-10 NOTE — ED Notes (Signed)
Repeat Lactic Acid was not obtained; EDP aware.

## 2019-02-11 ENCOUNTER — Telehealth: Payer: Self-pay

## 2019-02-11 ENCOUNTER — Telehealth: Payer: Self-pay | Admitting: Family

## 2019-02-11 ENCOUNTER — Inpatient Hospital Stay (HOSPITAL_COMMUNITY): Payer: Medicare Other

## 2019-02-11 ENCOUNTER — Encounter (HOSPITAL_COMMUNITY): Payer: Medicare Other

## 2019-02-11 DIAGNOSIS — I83222 Varicose veins of left lower extremity with both ulcer of calf and inflammation: Secondary | ICD-10-CM

## 2019-02-11 DIAGNOSIS — L03115 Cellulitis of right lower limb: Secondary | ICD-10-CM

## 2019-02-11 DIAGNOSIS — E1122 Type 2 diabetes mellitus with diabetic chronic kidney disease: Secondary | ICD-10-CM

## 2019-02-11 DIAGNOSIS — L03116 Cellulitis of left lower limb: Principal | ICD-10-CM

## 2019-02-11 DIAGNOSIS — L97221 Non-pressure chronic ulcer of left calf limited to breakdown of skin: Secondary | ICD-10-CM

## 2019-02-11 DIAGNOSIS — I96 Gangrene, not elsewhere classified: Secondary | ICD-10-CM

## 2019-02-11 DIAGNOSIS — Z992 Dependence on renal dialysis: Secondary | ICD-10-CM

## 2019-02-11 DIAGNOSIS — N186 End stage renal disease: Secondary | ICD-10-CM

## 2019-02-11 DIAGNOSIS — S81802A Unspecified open wound, left lower leg, initial encounter: Secondary | ICD-10-CM

## 2019-02-11 DIAGNOSIS — E440 Moderate protein-calorie malnutrition: Secondary | ICD-10-CM

## 2019-02-11 LAB — CBC
HCT: 28.5 % — ABNORMAL LOW (ref 39.0–52.0)
Hemoglobin: 9.3 g/dL — ABNORMAL LOW (ref 13.0–17.0)
MCH: 30.8 pg (ref 26.0–34.0)
MCHC: 32.6 g/dL (ref 30.0–36.0)
MCV: 94.4 fL (ref 80.0–100.0)
Platelets: 177 10*3/uL (ref 150–400)
RBC: 3.02 MIL/uL — ABNORMAL LOW (ref 4.22–5.81)
RDW: 19.7 % — ABNORMAL HIGH (ref 11.5–15.5)
WBC: 13.5 10*3/uL — ABNORMAL HIGH (ref 4.0–10.5)
nRBC: 0 % (ref 0.0–0.2)

## 2019-02-11 LAB — BASIC METABOLIC PANEL
Anion gap: 14 (ref 5–15)
BUN: 71 mg/dL — ABNORMAL HIGH (ref 8–23)
CO2: 26 mmol/L (ref 22–32)
Calcium: 8.7 mg/dL — ABNORMAL LOW (ref 8.9–10.3)
Chloride: 91 mmol/L — ABNORMAL LOW (ref 98–111)
Creatinine, Ser: 9.65 mg/dL — ABNORMAL HIGH (ref 0.61–1.24)
GFR calc Af Amer: 5 mL/min — ABNORMAL LOW (ref >60)
GFR calc non Af Amer: 5 mL/min — ABNORMAL LOW (ref >60)
Glucose, Bld: 110 mg/dL — ABNORMAL HIGH (ref 70–99)
Potassium: 3.3 mmol/L — ABNORMAL LOW (ref 3.5–5.1)
Sodium: 131 mmol/L — ABNORMAL LOW (ref 135–145)

## 2019-02-11 LAB — GLUCOSE, CAPILLARY
Glucose-Capillary: 156 mg/dL — ABNORMAL HIGH (ref 70–99)
Glucose-Capillary: 98 mg/dL (ref 70–99)
Glucose-Capillary: 137 mg/dL — ABNORMAL HIGH (ref 70–99)

## 2019-02-11 LAB — HEPARIN LEVEL (UNFRACTIONATED)
Heparin Unfractionated: >2.2 IU/mL — ABNORMAL HIGH (ref 0.30–0.70)
Heparin Unfractionated: 1.24 IU/mL — ABNORMAL HIGH (ref 0.30–0.70)
Heparin Unfractionated: <0.1 IU/mL — ABNORMAL LOW (ref 0.30–0.70)
Heparin Unfractionated: 0.23 IU/mL — ABNORMAL LOW (ref 0.30–0.70)

## 2019-02-11 MED ORDER — INSULIN ASPART 100 UNIT/ML ~~LOC~~ SOLN
0.0000 [IU] | Freq: Three times a day (TID) | SUBCUTANEOUS | Status: DC
  Administered 2019-02-16 – 2019-02-19 (×4): 2 [IU] via SUBCUTANEOUS

## 2019-02-11 MED ORDER — DARBEPOETIN ALFA 60 MCG/0.3ML IJ SOSY
60.0000 ug | PREFILLED_SYRINGE | INTRAMUSCULAR | Status: DC
  Administered 2019-02-12: 10:00:00 60 ug via INTRAVENOUS
  Filled 2019-02-11: qty 0.3

## 2019-02-11 MED ORDER — VITAMIN B-12 1000 MCG PO TABS
1000.0000 ug | ORAL_TABLET | Freq: Every day | ORAL | Status: DC
  Administered 2019-02-11 – 2019-02-20 (×10): 1000 ug via ORAL
  Filled 2019-02-11 (×10): qty 1

## 2019-02-11 MED ORDER — LIRAGLUTIDE 18 MG/3ML ~~LOC~~ SOPN: 0.6000 mg | PEN_INJECTOR | Freq: Every day | SUBCUTANEOUS | Status: DC

## 2019-02-11 MED ORDER — RENA-VITE PO TABS
1.0000 | ORAL_TABLET | Freq: Every day | ORAL | Status: DC
  Administered 2019-02-11 – 2019-02-19 (×8): 1 via ORAL
  Filled 2019-02-11 (×9): qty 1

## 2019-02-11 MED ORDER — FLUOXETINE HCL 20 MG PO CAPS
20.0000 mg | ORAL_CAPSULE | Freq: Every day | ORAL | Status: DC
  Administered 2019-02-11 – 2019-02-20 (×10): 20 mg via ORAL
  Filled 2019-02-11 (×10): qty 1

## 2019-02-11 MED ORDER — TICAGRELOR 90 MG PO TABS
90.0000 mg | ORAL_TABLET | Freq: Two times a day (BID) | ORAL | Status: DC
  Administered 2019-02-11 – 2019-02-15 (×9): 90 mg via ORAL
  Filled 2019-02-11 (×9): qty 1

## 2019-02-11 MED ORDER — PRO-STAT SUGAR FREE PO LIQD
30.0000 mL | Freq: Two times a day (BID) | ORAL | Status: DC
  Administered 2019-02-11 – 2019-02-19 (×7): 30 mL via ORAL
  Filled 2019-02-11 (×15): qty 30

## 2019-02-11 MED ORDER — CHLORHEXIDINE GLUCONATE CLOTH 2 % EX PADS
6.0000 | MEDICATED_PAD | Freq: Every day | CUTANEOUS | Status: DC
  Administered 2019-02-13: 06:00:00 6 via TOPICAL

## 2019-02-11 MED ORDER — HYDROXYCHLOROQUINE SULFATE 200 MG PO TABS
200.0000 mg | ORAL_TABLET | Freq: Every day | ORAL | Status: DC
  Administered 2019-02-11 – 2019-02-14 (×4): 200 mg via ORAL
  Filled 2019-02-11 (×4): qty 1

## 2019-02-11 NOTE — Progress Notes (Signed)
MD messaged due to patient wanting more clarity from his provider about why he is being prescribed insulin. Per attending physician "There is no need for me to re-iterate why he is on Insulin. He can decline if he likes." Patient encouraged to speak to his attending about Insulin during rounds tomorrow.

## 2019-02-11 NOTE — Progress Notes (Signed)
Called Lab tech the 2nd time to ff/up Stat heparin level order. Said she's on her way to draw blood.

## 2019-02-11 NOTE — Telephone Encounter (Signed)
Orders for pt given to home health and advised pt currently at the hospital

## 2019-02-11 NOTE — Progress Notes (Addendum)
[place] for Heparin Indication: atrial fibrillation  Allergies  Allergen Reactions  . Neurontin [Gabapentin] Other (See Comments)    Encephalopathy with myoclonic jerking  . Ace Inhibitors Other (See Comments)    Worsening renal insufficiency    Patient Measurements: Height: 6\' 3"  (190.5 cm) Weight: 275 lb 12.7 oz (125.1 kg) IBW/kg (Calculated) : 84.5 Heparin Dosing Weight: 111.5  Vital Signs: Temp: 98.1 F (36.7 C) (07/27 0358) Temp Source: Oral (07/27 0358) BP: 108/62 (07/27 0358) Pulse Rate: 56 (07/27 0358)  Labs: Recent Labs    01/22/2019 1000 02/15/2019 1046 02/11/19 0325 02/11/19 0807  HGB 10.0*  --  9.3*  --   HCT 31.5*  --  28.5*  --   PLT 180  --  177  --   APTT  --  42*  --   --   LABPROT  --  18.1*  --   --   INR  --  1.5*  --   --   HEPARINUNFRC  --   --  >2.20* 1.24*  CREATININE 8.78*  --  9.65*  --   CKTOTAL 198  --   --   --     Estimated Creatinine Clearance: 9.4 mL/min (A) (by C-G formula based on SCr of 9.65 mg/dL (H)).   Medications:  . heparin 1,600 Units/hr (02/11/19 0755)  . piperacillin-tazobactam (ZOSYN)  IV 3.375 g (02/11/19 0759)  . vancomycin      Assessment: Pt is a 78 y/o male with PMH of ESRD on HD TuThSat, HTN, CHF, A-fib (on coumadin PTA), recurrent zoster, recurrent falls x3 in last week, RLE cellulitis, and recent diagnosis of new seizures on Keppra who was discharged from a skilled facility last week. Pt presented to [facility] on 07/25 after having a fall while going to the bathroom. Pt was down for approx. 5 hours before he was found down. Pt denies hitting his head or losing consciousness. He denies having any tremors, seizure like activity, or recent changes in medications. Pt was on warfarin at home PTA with an admit INR of 1.5. Pharmacy has been consulted to dose heparin while warfarin is on hold pending possible LP.   Heparin levels this morning are likely inaccurate at >2.2 and 1.24 because  phlebotomy is drawing near the heparin infusion. Patient has limited access due to ESRD. No bleeding noted, Hgb down to 9.3, platelets are normal.  Goal of Therapy:  Heparin level 0.3-0.7 units/ml Monitor platelets by anticoagulation protocol: Yes   Plan:  Continue heparin infusion at 1600 units/hr Foot sticks ok per Dr Wynelle Cleveland - will get one now Daily CBC and heparin level Monitor for s/sx of bleeding   Thank you for involving pharmacy in this patient's care.  Renold Genta, PharmD, BCPS Clinical Pharmacist Clinical phone for 02/11/2019 until 3p is [phone] 02/11/2019 9:50 AM  **Pharmacist phone directory can be found on [email or web address] listed under [place]**   Addendum: Lab just called and heparin level is undetectable (<0.1) with blood drawn from foot.  Increase heparin drip to 1950 units/hr 8 hr heparin level  Renold Genta, PharmD, BCPS 10:50 AM

## 2019-02-11 NOTE — Consult Note (Addendum)
[facility] Renal Consultation Note    Indication for Consultation:  Management of ESRD/hemodialysis, anemia, hypertension/volume, and secondary hyperparathyroidism. PCP:  HPI: Bruce Cole is a 78 y.o. male with a history of ESRD, HTN, CHF, a.fib. on coumadin, T2DM, Rheumatoid arthritis, and seizures who presented to the ED after a fall at home. He reportedly was transferring to the commode and his walker slid. He was unable to call for help and was on the floor for about 5 hours. He denied any injury but has had recurrent falls recently. He does live alone.  On presentation, T 99, WBC 15. Patient noted to have right leg/thigh cellulitis with necrosis. He is currently on zosyn and vancomycin. He was seen by ortho today who recommended MRI, ABIs and possible debridement.   Patient normally dialyzes on MWF schedule at [facility]. He has been compliant with his treatments but left 3.4kg above his EDW on 02/08/2019 per notes. He is currently 1.6kg above his EDW by weights here. He has some peripheral edema but reports his legs feel like they are are baseline. He reports a dry cough from wearing a mask in the MRI. Otherwise denies SOB, dyspnea, CP, palpitations, abdominal pain, N/V/D. Would like to get back on his regular dialysis schedule and wants to know if he can use a bedside commode instead of a bed pan.    Past Medical History:  Diagnosis Date  . Acute blood loss anemia   . Atrial fibrillation (HCC)    Chronic  . Chronic combined systolic and diastolic heart failure ([place])   . Colon polyp 2000  . Dysrhythmia    hx  . ESRD on hemodialysis ([place])    [place]  . Essential hypertension   . Gastritis and gastroduodenitis    with bleeding  . Gout   . Heart murmur   . Herpes   . History of blood transfusion   . Nephrolithiasis   . OSA (obstructive sleep apnea) 09/02/2013    IMPRESSION :  1. Mild obstructive sleep apnea with hypopneas causing sleep  fragmentation and moderate oxygen desaturation.  2. Short runs of nonsustained VT were noted. His beta blocker may need to be titrated 3. Significant PLM's were noted, the PLM arousal index was low. Please correlate with clinical history of restless leg syndrome.  4. Sleep efficiency was poor.  RECOMMENDATION:  1. Treatment options for this degree of sleep disordered breathing include weight loss and positional therapy to avoid supine sleep 2. Consider titrating beta blocker further, defer to cardiologist 3. Patient should be cautioned against driving when sleepy.They should be asked to avoid medications with sedative side effects     . Osteoarthritis of right hip   . Pneumonia    hx 30 yrs ago  . Primary osteoarthritis of right hip   . Rheumatoid arthritis ([place])   . Seizures ([place])   . Thrombocytopenia ([place])    Past Surgical History:  Procedure Laterality Date  . AV FISTULA PLACEMENT Left 08/26/2015   Procedure: LEFT RADIOCEPHALIC FISTULA CREATION;  Surgeon: Rosetta Posner, MD;  Location: [place];  Service: Vascular;  Laterality: Left;  . AV FISTULA PLACEMENT Left 11/23/2015   Procedure: ARTERIOVENOUS (AV) FISTULA CREATION;  Surgeon: Rosetta Posner, MD;  Location: [place];  Service: Vascular;  Laterality: Left;  . BACK SURGERY     x2- discectomy  . CHOLECYSTECTOMY  1994  . CYSTOSCOPY/RETROGRADE/URETEROSCOPY/STONE EXTRACTION WITH BASKET    . ESOPHAGOGASTRODUODENOSCOPY N/A 11/13/2015  Procedure: ESOPHAGOGASTRODUODENOSCOPY (EGD);  Surgeon: Irene Shipper, MD;  Location[facility] ENDOSCOPY;  Service: Endoscopy;  Laterality: N/A;  . INSERTION OF DIALYSIS CATHETER Left 11/23/2015   Procedure: INSERTION OF DIALYSIS CATHETER;  Surgeon: Rosetta Posner, MD;  Location: [place];  Service: Vascular;  Laterality: Left;  . IR GENERIC HISTORICAL Left 08/04/2016   IR DIALY SHUNT INTRO NEEDLE/INTRACATH INITIAL W/IMG LEFT 08/04/2016 Markus Daft, MD [facility]-INTERV RAD  . IR GENERIC HISTORICAL  08/04/2016   IR US GUIDE VASC ACCESS LEFT  08/04/2016 Markus Daft, MD [facility]INTER[facility]  . JOINT REPLACEMENT     Total L-Hip replacement, Right Knee 10/20/09  . LEFT AND RIGHT HEART CATHETERIZATION WITH CORONARY ANGIOGRAM N/A 02/22/2013   Procedure: LEFT AND RIGHT HEART CATHETERIZATION WITH CORONARY ANGIOGRAM;  Surgeon: Jolaine Artist, MD;  Location: [facility] CATH LAB;  Service: Cardiovascular;  Laterality: N/A;  . LITHOTRIPSY  90's  . PERIPHERAL VASCULAR CATHETERIZATION Left 11/19/2015   Procedure: A/V/Fistulagram;  Surgeon: Conrad De Kalb, MD;  Location: [place] CV LAB;  Service: Cardiovascular;  Laterality: Left;  . PERIPHERAL VASCULAR CATHETERIZATION Left 07/21/2016   Procedure: A/V Fistulagram;  Surgeon: Conrad [place], MD;  Location: [place] CV LAB;  Service: Cardiovascular;  Laterality: Left;  arm  . REVISON OF ARTERIOVENOUS FISTULA Left 05/30/2016   Procedure: REVISION LEFT UPPER ARM FISTULA;  Surgeon: Conrad [place], MD;  Location: [place];  Service: Vascular;  Laterality: Left;  . REVISON OF ARTERIOVENOUS FISTULA Left 07/22/2016   Procedure: REVISON OF BASILIC VEIN TRANSPOSITION ANASTOMOSIS;  Surgeon: Rosetta Posner, MD;  Location: [place];  Service: Vascular;  Laterality: Left;  . SPINE SURGERY     x 2  . TOTAL HIP ARTHROPLASTY Right 03/22/2016   Procedure: RIGHT TOTAL HIP ARTHROPLASTY ANTERIOR APPROACH;  Surgeon: Mcarthur Rossetti, MD;  Location: [place];  Service: Orthopedics;  Laterality: Right;   Family History  Problem Relation Age of Onset  . Hypertension Mother   . Arthritis Mother        ?RA  . Hypertension Father    Social History:  reports that he has never smoked. He has never used smokeless tobacco. He reports that he does not drink alcohol or use drugs.  ROS: As per HPI otherwise negative.  Physical Exam: Vitals:   02/11/2019 2111 02/02/2019 2200 02/11/19 0358 02/11/19 1335  BP: 94/66  108/62 (!) 105/55  Pulse: (!) 50 (!) 58 (!) 56 (!) 102  Resp: (!) 24 20 20  (!) 22  Temp: 98.7 F (37.1 C)  98.1 F (36.7 C) 98.6 F (37  C)  TempSrc: Oral  Oral Oral  SpO2: 98%  99% 96%  Weight:      Height:         General: Well developed, obese male, in no acute distress. Head: Normocephalic, atraumatic, sclera non-icteric, mucus membranes are moist. Neck: JVD not elevated. Lungs: Clear bilaterally to auscultation without wheezes, rales, or rhonchi. Breathing is unlabored. Occasional dry cough. Heart: RRR with normal S1, S2. No murmurs, rubs, or gallops appreciated. Abdomen: Soft, non-tender, non-distended with normoactive bowel sounds. No rebound/guarding. No obvious abdominal masses. Musculoskeletal:  Strength and tone appear normal for age. Lower extremities: 1+ edema bilateral lower extremities.  LLE with extensive wound on posterior leg Neuro: Alert and oriented X 3. Moves all extremities spontaneously. Psych:  Responds to questions appropriately with a normal affect. Dialysis Access: RUE AVF + thrill  Allergies  Allergen Reactions  . Neurontin [Gabapentin] Other (See Comments)  Encephalopathy with myoclonic jerking  . Ace Inhibitors Other (See Comments)    Worsening renal insufficiency   Prior to Admission medications   Medication Sig Start Date End Date Taking? Authorizing Provider  albuterol (VENTOLIN HFA) 108 (90 Base) MCG/ACT inhaler Inhale 1 puff into the lungs every 4 (four) hours as needed for wheezing or shortness of breath.   Yes [provider]  atorvastatin (LIPITOR) 10 MG tablet TAKE 1 TABLET(10 MG) BY MOUTH DAILY Patient taking differently: Take 10 mg by mouth daily.  12/12/18  Yes Debbrah Alar, NP  B Complex-C-Folic Acid (RENA-VITE RX) 1 MG TABS Take 1 tablet by mouth at bedtime.  02/18/16  Yes [provider]  colchicine 0.6 MG tablet Take 1/2 tablet by mouth once as needed for gout flare. Call if no improvement 01/02/18  Yes Debbrah Alar, NP  dorzolamide (TRUSOPT) 2 % ophthalmic solution Place 1 drop into both eyes 2 (two) times daily.   Yes [provider]  FLUoxetine (PROZAC) 20 MG capsule Take 20 mg by mouth daily.   Yes [provider]  hydroxychloroquine (PLAQUENIL) 200 MG tablet Take 200 mg by mouth daily.   Yes [provider]  hydrOXYzine (ATARAX/VISTARIL) 25 MG tablet Take 1 tablet (25 mg total) by mouth every 8 (eight) hours as needed for itching. 12/13/18  Yes Mikhail, Velta Addison, DO  insulin detemir (LEVEMIR) 100 UNIT/ML injection Inject 15 Units into the skin daily.   Yes [provider]  latanoprost (XALATAN) 0.005 % ophthalmic solution Place 1 drop into both eyes at bedtime.   Yes [provider]  levETIRAcetam (KEPPRA) 500 MG tablet Take 1 tablet by mouth once daily after dialysis (only take on dialysis days) 01/10/19  Yes Debbrah Alar, NP  liraglutide (VICTOZA) 18 MG/3ML SOPN Inject 0.6 mg into the skin daily.   Yes [provider]  midodrine (PROAMATINE) 10 MG tablet Take 1 tablet (10 mg total) by mouth every Monday, Wednesday, and Friday with hemodialysis. 12/13/18  Yes Mikhail, Velta Addison, DO  midodrine (PROAMATINE) 5 MG tablet Take 1 tablet (5 mg total) by mouth 2 (two) times daily with a meal. 12/13/18  Yes Mikhail, Velta Addison, DO  omeprazole (PRILOSEC OTC) 20 MG tablet Take 20 mg by mouth daily as needed (heartburn).   Yes [provider]  pantoprazole (PROTONIX) 40 MG tablet Take 1 tablet (40 mg total) by mouth daily. Patient taking differently: Take 20 mg by mouth daily.  02/07/19  Yes Debbrah Alar, NP  RENVELA 800 MG tablet Take 3 tablets (2,400 mg total) by mouth 3 (three) times daily with meals. 07/23/16  Yes Lavina Hamman, MD  SANTYL ointment Apply 1 application topically daily. Patient taking differently: Apply 1 application topically daily as needed (wound care).  01/17/19 02/16/19 Yes Debbrah Alar, NP  ticagrelor (BRILINTA) 90 MG TABS tablet Take 90 mg by mouth 2 (two) times daily. As directed   Yes [provider]  vitamin B-12 (CYANOCOBALAMIN) 1000  MCG tablet Take 1,000 mcg by mouth daily.   Yes [provider]  warfarin (COUMADIN) 5 MG tablet Take 5 mg by mouth daily.  12/31/18  Yes [provider]  ferric citrate (AURYXIA) 1 GM 210 MG(Fe) tablet Take 2 tablets (420 mg total) by mouth 3 (three) times daily with meals. Patient not taking: Reported on 02/11/2019 12/13/18   Cristal Ford, DO  Nutritional Supplements (FEEDING SUPPLEMENT, NEPRO CARB STEADY,) LIQD Take 237 mLs by mouth 3 (three) times daily as needed (Supplement). Patient not  taking: Reported on 01/08/2019 12/13/18   Cristal Ford, DO   Current Facility-Administered Medications  Medication Dose Route Frequency Provider Last Rate Last Dose  . acetaminophen (TYLENOL) tablet 650 mg  650 mg Oral Q6H PRN Karmen Bongo, MD       Or  . acetaminophen (TYLENOL) suppository 650 mg  650 mg Rectal Q6H PRN Karmen Bongo, MD      . atorvastatin (LIPITOR) tablet 10 mg  10 mg Oral Daily Karmen Bongo, MD   10 mg at 02/11/19 1437  . calcium carbonate (dosed in mg elemental calcium) suspension 500 mg of elemental calcium  500 mg of elemental calcium Oral Q6H PRN Karmen Bongo, MD      . docusate sodium [facility]) enema 283 mg  1 enema Rectal PRN Karmen Bongo, MD      . feeding supplement (NEPRO CARB STEADY) liquid 237 mL  237 mL Oral TID PRN Karmen Bongo, MD      . ferric citrate (AURYXIA) tablet 420 mg  420 mg Oral TID WC Karmen Bongo, MD   420 mg at 02/11/19 1436  . FLUoxetine (PROZAC) capsule 20 mg  20 mg Oral Daily Rizwan, Eunice Blase, MD   20 mg at 02/11/19 1437  . heparin ADULT infusion 100 units/mL (25000 units/298mL sodium chloride 0.45%)  1,950 Units/hr Intravenous Continuous Alvira Philips, RPH 19.5 mL/hr at 02/11/19 1058 1,950 Units/hr at 02/11/19 1058  . hydroxychloroquine (PLAQUENIL) tablet 200 mg  200 mg Oral Daily Debbe Odea, MD   200 mg at 02/11/19 1437  . hydrOXYzine (ATARAX/VISTARIL) tablet 25 mg  25 mg Oral Q8H PRN Karmen Bongo, MD      .  insulin aspart (novoLOG) injection 0-15 Units  0-15 Units Subcutaneous TID WC Rizwan, Saima, MD      . liraglutide (VICTOZA) SOPN 0.6 mg  0.6 mg Subcutaneous Daily Rizwan, Saima, MD      . midodrine (PROAMATINE) tablet 10 mg  10 mg Oral Q M,W,F-HD Karmen Bongo, MD      . midodrine (PROAMATINE) tablet 5 mg  5 mg Oral BID WC Karmen Bongo, MD   Stopped at 02/11/19 0801  . multivitamin (RENA-VIT) tablet 1 tablet  1 tablet Oral QHS Debbe Odea, MD      . ondansetron (ZOFRAN) tablet 4 mg  4 mg Oral Q6H PRN Karmen Bongo, MD       Or  . ondansetron [facility]) injection 4 mg  4 mg Intravenous Q6H PRN Karmen Bongo, MD      . pantoprazole (PROTONIX) EC tablet 40 mg  40 mg Oral Daily Karmen Bongo, MD   40 mg at 02/11/19 1438  . piperacillin-tazobactam (ZOSYN) IVPB 3.375 g  3.375 g Intravenous Q12H Sherren Kerns D, RPH 12.5 mL/hr at 02/11/19 0759 3.375 g at 02/11/19 0759  . sevelamer carbonate (RENVELA) tablet 2,400 mg  2,400 mg Oral TID WC Karmen Bongo, MD   2,400 mg at 02/11/19 1436  . sorbitol 70 % solution 30 mL  30 mL Oral PRN Karmen Bongo, MD      . ticagrelor [facility]) tablet 90 mg  90 mg Oral BID Debbe Odea, MD   90 mg at 02/11/19 1437  . vancomycin (VANCOCIN) IVPB 1000 mg/200 mL premix  1,000 mg Intravenous Q M,W,F-HD Werner Lean, RPH      . vitamin B-12 (CYANOCOBALAMIN) tablet 1,000 mcg  1,000 mcg Oral Daily Debbe Odea, MD   1,000 mcg at 02/11/19 1438  . zolpidem (AMBIEN) tablet 5 mg  5 mg Oral  QHS PRN Karmen Bongo, MD       Labs: Basic Metabolic Panel: Recent Labs  Lab 02/08/2019 1000 02/11/19 0325  NA 133* 131*  K 3.6 3.3*  CL 89* 91*  CO2 25 26  GLUCOSE 132* 110*  BUN 67* 71*  CREATININE 8.78* 9.65*  CALCIUM 9.4 8.7*   Liver Function Tests: Recent Labs  Lab 01/18/2019 1000  AST 30  ALT 18  ALKPHOS 269*  BILITOT 1.2  PROT 7.9  ALBUMIN 3.3*   No results for input(s): LIPASE, AMYLASE in the last 168 hours. No results for input(s): AMMONIA in the  last 168 hours. CBC: Recent Labs  Lab 01/20/2019 1000 02/11/19 0325  WBC 15.7* 13.5*  NEUTROABS 13.6*  --   HGB 10.0* 9.3*  HCT 31.5* 28.5*  MCV 96.9 94.4  PLT 180 177   Cardiac Enzymes: Recent Labs  Lab 01/25/2019 1000  CKTOTAL 198   CBG: Recent Labs  Lab 02/11/19 1300  GLUCAP 156*   Studies/Results: Dg Chest Port 1 View  Result Date: 01/27/2019 CLINICAL DATA:  78 year old male with history of unwitnessed fall today. Generalized fatigue. EXAM: PORTABLE CHEST 1 VIEW COMPARISON:  Chest x-ray 12/09/2018. FINDINGS: Lung volumes are normal. No consolidative airspace disease. No pleural effusions. No evidence of pulmonary edema. Moderate cardiomegaly (unchanged). Upper mediastinal contours are within normal limits. Aortic atherosclerosis. IMPRESSION: 1.  No radiographic evidence of acute cardiopulmonary disease. 2. Moderate cardiomegaly (unchanged). 3. Aortic atherosclerosis. Electronically Signed   By: Vinnie Langton M.D.   On: 01/20/2019 09:53    Dialysis Orders: Center: [facility]  on MWF. 2K/ 2.25 Ca; EDW 123.5kg (last post weight +3.4kg); LUE AVF Heparin 2000 unit bolus Mircera 146mcg IV q 2 weeks  Renvela 800mg  3 tabs PO TID  Assessment/Plan: 1.  Cellulitis: Left lower extremity, seen by ortho today and going for MRI.  Failed outpatient treatment. Suspect debridement may be needed. On vancomycin and zosyn. Per primary/ortho. Also has pressure injury on right buttocks, wound care following.  2.  ESRD:  Normally dialyzes MWF at [facility]. Last HD was 7/24. K+ 3.3, will use 4K bath with dialysis tomorrow. No urgent need for dialysis at this time. Will likely need to dialyze tomorrow due to high inpatient dialysis census. On heparin infusion, will not give heparin bolus with HD if still on continuous infusion. 3.  Hypertension/volume: Blood pressure chronically low. On midodrine. Chest x-ray yesterday without acute cardiopulmonary disease. Currently 1.6kg above EDW but does  have peripheral edema. Will plan for HD tomorrow with UFG 3L as tolerated. 4.  Anemia: Hemoglobin 9.3. Due for ESA, will start aranesp 72mcg IV q week with dialysis tomorrow.  5.  Metabolic bone disease: Calcium 8.7. Last outpatient phos 3.7, PTH 241. Continue renvela.  6.  Nutrition:  Albumin 3.3 Will start pro-stat supplement.  7. A. Fib on coumadin: INR reportedly suptherapeutic, bridging with heparin. Per primary. 8. T2DM: Currently in SSI 9. Seizure disorder: On Keppra  Anice Paganini, PA-C 02/11/2019, 3:45 PM  [facility] Pager: [phone]

## 2019-02-11 NOTE — Telephone Encounter (Signed)
Margaret at [facility] was notified patient was scheduled last week to go to Wound center in [place] on 02-14-2019 as the center in [place] does not have any openings until 02-23-19. Information given to patient's daughter last week.

## 2019-02-11 NOTE — Progress Notes (Addendum)
[place] for Heparin Indication: atrial fibrillation  Allergies  Allergen Reactions  . Neurontin [Gabapentin] Other (See Comments)    Encephalopathy with myoclonic jerking  . Ace Inhibitors Other (See Comments)    Worsening renal insufficiency    Patient Measurements: Height: 6\' 3"  (190.5 cm) Weight: 275 lb 12.7 oz (125.1 kg) IBW/kg (Calculated) : 84.5 Heparin Dosing Weight: 111.5   Vital Signs: Temp: 98.6 F (37 C) (07/27 1335) Temp Source: Oral (07/27 1335) BP: 105/55 (07/27 1335) Pulse Rate: 102 (07/27 1335)  Labs: Recent Labs    01/20/2019 1000 01/18/2019 1046  02/11/19 0325 02/11/19 0807 02/11/19 1003 02/11/19 1907  HGB 10.0*  --   --  9.3*  --   --   --   HCT 31.5*  --   --  28.5*  --   --   --   PLT 180  --   --  177  --   --   --   APTT  --  42*  --   --   --   --   --   LABPROT  --  18.1*  --   --   --   --   --   INR  --  1.5*  --   --   --   --   --   HEPARINUNFRC  --   --    < > >2.20* 1.24* <0.10* 0.23*  CREATININE 8.78*  --   --  9.65*  --   --   --   CKTOTAL 198  --   --   --   --   --   --    < > = values in this interval not displayed.    Estimated Creatinine Clearance: 9.4 mL/min (A) (by C-G formula based on SCr of 9.65 mg/dL (H)).  Assessment: Pt is a 78 y/o male with PMH of ESRD on HD TuThSat, HTN, CHF, A-fib (on warfarin PTA), recurrent zoster, recurrent falls x3 in last week, RLE cellulitis, and recent diagnosis of new seizures on Keppra who was discharged from a skilled facility last week. Pt presented to [facility] on 07/25 after having a fall while going to the bathroom. Pt was down for approx. 5 hours before he was found down. Pt denies hitting his head or losing consciousness. He denies having any tremors, seizure like activity, or recent changes in medications. Pt was on warfarin at home PTA with an admit INR of 1.5. Pharmacy has been consulted to dose heparin while warfarin is on hold pending possible LP.    Heparin level drawn ~8 hrs after heparin infusion rate increase to 1950 units/hr was 0.23 units/mL, which is below the goal range for this patient. Per RN, no issues with heparin IV or signs/symptoms of bleeding noted. H/H 9.3/28.5; platelets stable at 177.  Of note, patient is now having heparin levels drawn through a foot stick, due to limited access (ok'd with Dr. Wynelle Cleveland).  Goal of Therapy:  Heparin level 0.3-0.7 units/ml Monitor platelets by anticoagulation protocol: Yes   Plan:  Increase heparin infusion to 2200 units/hr Heparin level, CBC with AM labs and daily Monitor for signs/symptoms of bleeding  Thank you for involving pharmacy in this patient's care.  Gillermina Hu, PharmD, BCPS, [facility] Clinical Pharmacist 02/11/2019 8:15 PM  **Pharmacist phone directory can be found on [email or web address] listed under [place]**

## 2019-02-11 NOTE — Telephone Encounter (Signed)
Gracee with [facility] calling to increase pt's PT visits to   2 wk 3  cb  [phone]

## 2019-02-11 NOTE — Telephone Encounter (Signed)
Copied from [place] [id number]. Topic: General - Other >> Feb 07, 2019 10:54 AM Rainey Pines A wrote: Joycelyn Schmid at [place] [facility] received referral for stage 2/3 on buttock and nurse stated that he has a large area of black eschar near chin area and its 17 by 5 centimeters and would like to get an order for a wound center. Joycelyn Schmid would like to speak with a nurse in regards to patient potentially having circulatory issues.  She can be reached at [phone] or call and ask to speak with any Manager if called tomorrow. >> Feb 08, 2019  5:09 PM Percell Belt A wrote: Pt called in and state that he would like someone to call him back asap for the referral to the wound clinic.  He wants an app asap. Please advise  Best number [phone]

## 2019-02-11 NOTE — Social Work (Signed)
Per morning rounds and Epic chart pt banner states he has a legal guardian. Upon chart review pt is orientedx4, no paperwork indicating that pt has a court appointed legal guardian at this time.  Westley Hummer, MSW, [place] Work [phone]

## 2019-02-11 NOTE — Telephone Encounter (Signed)
Noted. Tks.

## 2019-02-11 NOTE — Progress Notes (Signed)
Called Lab. to ff/up Stat Heparin level order.

## 2019-02-11 NOTE — Consult Note (Signed)
Reason for Consult:LLE wounds Referring Physician: S Rizwan  Bruce Cole is an 78 y.o. male.  HPI: Bruce Cole was admitted yesterday after a fall associated with a seizure. He was found incidentally to have several wounds but the most significant was found posteriorly on his left leg. The patient tells me he fell in an enclosed space about 6 weeks ago and had to pull his legs up to his chest to get out. He felt a tear in the region of his calf and thought he had a muscle tear. He is not a great historian. He notes itchiness to the posterior leg but no pain. He denies fevers, chills, sweats, N/V.  Past Medical History:  Diagnosis Date  . Acute blood loss anemia   . Atrial fibrillation (HCC)    Chronic  . Chronic combined systolic and diastolic heart failure ([place])   . Colon polyp 2000  . Dysrhythmia    hx  . ESRD on hemodialysis ([place])    [place]  . Essential hypertension   . Gastritis and gastroduodenitis    with bleeding  . Gout   . Heart murmur   . Herpes   . History of blood transfusion   . Nephrolithiasis   . OSA (obstructive sleep apnea) 09/02/2013    IMPRESSION :  1. Mild obstructive sleep apnea with hypopneas causing sleep fragmentation and moderate oxygen desaturation.  2. Short runs of nonsustained VT were noted. His beta blocker may need to be titrated 3. Significant PLM's were noted, the PLM arousal index was low. Please correlate with clinical history of restless leg syndrome.  4. Sleep efficiency was poor.  RECOMMENDATION:  1. Treatment options for this degree of sleep disordered breathing include weight loss and positional therapy to avoid supine sleep 2. Consider titrating beta blocker further, defer to cardiologist 3. Patient should be cautioned against driving when sleepy.They should be asked to avoid medications with sedative side effects     . Osteoarthritis of right hip   . Pneumonia    hx 30 yrs ago  . Primary osteoarthritis of right hip   .  Rheumatoid arthritis ([place])   . Seizures ([place])   . Thrombocytopenia ([place])     Past Surgical History:  Procedure Laterality Date  . AV FISTULA PLACEMENT Left 08/26/2015   Procedure: LEFT RADIOCEPHALIC FISTULA CREATION;  Surgeon: Rosetta Posner, MD;  Location: [place];  Service: Vascular;  Laterality: Left;  . AV FISTULA PLACEMENT Left 11/23/2015   Procedure: ARTERIOVENOUS (AV) FISTULA CREATION;  Surgeon: Rosetta Posner, MD;  Location: [place];  Service: Vascular;  Laterality: Left;  . BACK SURGERY     x2- discectomy  . CHOLECYSTECTOMY  1994  . CYSTOSCOPY/RETROGRADE/URETEROSCOPY/STONE EXTRACTION WITH BASKET    . ESOPHAGOGASTRODUODENOSCOPY N/A 11/13/2015   Procedure: ESOPHAGOGASTRODUODENOSCOPY (EGD);  Surgeon: Irene Shipper, MD;  Location[facility] ENDOSCOPY;  Service: Endoscopy;  Laterality: N/A;  . INSERTION OF DIALYSIS CATHETER Left 11/23/2015   Procedure: INSERTION OF DIALYSIS CATHETER;  Surgeon: Rosetta Posner, MD;  Location: [place];  Service: Vascular;  Laterality: Left;  . IR GENERIC HISTORICAL Left 08/04/2016   IR DIALY SHUNT INTRO NEEDLE/INTRACATH INITIAL W/IMG LEFT 08/04/2016 Markus Daft, MD [facility]INTER[facility] RAD  . IR GENERIC HISTORICAL  08/04/2016   IR US GUIDE VASC ACCESS LEFT 08/04/2016 Markus Daft, MD [facility]  . JOINT REPLACEMENT     Total L-Hip replacement, Right Knee 10/20/09  . LEFT AND RIGHT HEART CATHETERIZATION WITH CORONARY ANGIOGRAM N/A 02/22/2013  Procedure: LEFT AND RIGHT HEART CATHETERIZATION WITH CORONARY ANGIOGRAM;  Surgeon: Jolaine Artist, MD;  Location: [facility] CATH LAB;  Service: Cardiovascular;  Laterality: N/A;  . LITHOTRIPSY  90's  . PERIPHERAL VASCULAR CATHETERIZATION Left 11/19/2015   Procedure: A/V/Fistulagram;  Surgeon: Conrad Deer Lodge, MD;  Location: [place] CV LAB;  Service: Cardiovascular;  Laterality: Left;  . PERIPHERAL VASCULAR CATHETERIZATION Left 07/21/2016   Procedure: A/V Fistulagram;  Surgeon: Conrad Edcouch, MD;  Location: [place] CV LAB;  Service: Cardiovascular;  Laterality:  Left;  arm  . REVISON OF ARTERIOVENOUS FISTULA Left 05/30/2016   Procedure: REVISION LEFT UPPER ARM FISTULA;  Surgeon: Conrad Lynch, MD;  Location: [place];  Service: Vascular;  Laterality: Left;  . REVISON OF ARTERIOVENOUS FISTULA Left 07/22/2016   Procedure: REVISON OF BASILIC VEIN TRANSPOSITION ANASTOMOSIS;  Surgeon: Rosetta Posner, MD;  Location: [place];  Service: Vascular;  Laterality: Left;  . SPINE SURGERY     x 2  . TOTAL HIP ARTHROPLASTY Right 03/22/2016   Procedure: RIGHT TOTAL HIP ARTHROPLASTY ANTERIOR APPROACH;  Surgeon: Mcarthur Rossetti, MD;  Location: [place];  Service: Orthopedics;  Laterality: Right;    Family History  Problem Relation Age of Onset  . Hypertension Mother   . Arthritis Mother        ?RA  . Hypertension Father     Social History:  reports that he has never smoked. He has never used smokeless tobacco. He reports that he does not drink alcohol or use drugs.  Allergies:  Allergies  Allergen Reactions  . Neurontin [Gabapentin] Other (See Comments)    Encephalopathy with myoclonic jerking  . Ace Inhibitors Other (See Comments)    Worsening renal insufficiency    Medications: I have reviewed the patient's current medications.  Results for orders placed or performed during the hospital encounter of 01/19/2019 (from the past 48 hour(s))  Blood Culture (routine x 2)     Status: None (Preliminary result)   Collection Time: 02/11/2019  9:30 AM   Specimen: BLOOD RIGHT ARM  Result Value Ref Range   Specimen Description      BLOOD RIGHT ARM Performed at [facility], [address] [place], [place] [address]    Special Requests      BOTTLES DRAWN AEROBIC AND ANAEROBIC Blood Culture adequate volume Performed at [facility], [address] [place], [place] [address]    Culture      NO GROWTH < 24 HOURS Performed at [facility], [place] [address] [place], [place] [address]    Report Status PENDING   Blood Culture (routine x 2)      Status: None (Preliminary result)   Collection Time: 01/19/2019  9:50 AM   Specimen: BLOOD RIGHT HAND  Result Value Ref Range   Specimen Description      BLOOD RIGHT HAND Performed at [facility], [address] [place], [place] [address]    Special Requests      BOTTLES DRAWN AEROBIC AND ANAEROBIC Blood Culture results may not be optimal due to an inadequate volume of blood received in culture bottles Performed at [facility], [address] [place], [place] [address]    Culture      NO GROWTH < 24 HOURS Performed at [facility], [place] [address] [place], [place] [address]    Report Status PENDING   Lactic acid, plasma  Status: Abnormal   Collection Time: 01/29/2019 10:00 AM  Result Value Ref Range   Lactic Acid, Venous 2.0 (HH) 0.5 - 1.9 mmol/L    Comment: CRITICAL RESULT CALLED TO, READ BACK BY AND VERIFIED WITH: REED C RN [phone] PHILLIPS C Performed at [facility], [address] [place], [place] [address]   Comprehensive metabolic panel     Status: Abnormal   Collection Time: 02/01/2019 10:00 AM  Result Value Ref Range   Sodium 133 (L) 135 - 145 mmol/L   Potassium 3.6 3.5 - 5.1 mmol/L   Chloride 89 (L) 98 - 111 mmol/L   CO2 25 22 - 32 mmol/L   Glucose, Bld 132 (H) 70 - 99 mg/dL   BUN 67 (H) 8 - 23 mg/dL   Creatinine, Ser 8.78 (H) 0.61 - 1.24 mg/dL   Calcium 9.4 8.9 - 10.3 mg/dL   Total Protein 7.9 6.5 - 8.1 g/dL   Albumin 3.3 (L) 3.5 - 5.0 g/dL   AST 30 15 - 41 U/L   ALT 18 0 - 44 U/L   Alkaline Phosphatase 269 (H) 38 - 126 U/L   Total Bilirubin 1.2 0.3 - 1.2 mg/dL   GFR calc non Af Amer 5 (L) >60 mL/min   GFR calc Af Amer 6 (L) >60 mL/min   Anion gap 19 (H) 5 - 15    Comment: Performed at [facility], [address] [place], [place] [address]  CBC WITH DIFFERENTIAL     Status: Abnormal   Collection Time: 01/20/2019 10:00 AM  Result Value Ref Range   WBC 15.7 (H) 4.0 - 10.5 K/uL   RBC 3.25 (L) 4.22 - 5.81  MIL/uL   Hemoglobin 10.0 (L) 13.0 - 17.0 g/dL   HCT 31.5 (L) 39.0 - 52.0 %   MCV 96.9 80.0 - 100.0 fL   MCH 30.8 26.0 - 34.0 pg   MCHC 31.7 30.0 - 36.0 g/dL   RDW 19.6 (H) 11.5 - 15.5 %   Platelets 180 150 - 400 K/uL   nRBC 0.0 0.0 - 0.2 %   Neutrophils Relative % 87 %   Neutro Abs 13.6 (H) 1.7 - 7.7 K/uL   Lymphocytes Relative 4 %   Lymphs Abs 0.6 (L) 0.7 - 4.0 K/uL   Monocytes Relative 9 %   Monocytes Absolute 1.4 (H) 0.1 - 1.0 K/uL   Eosinophils Relative 0 %   Eosinophils Absolute 0.0 0.0 - 0.5 K/uL   Basophils Relative 0 %   Basophils Absolute 0.0 0.0 - 0.1 K/uL   Immature Granulocytes 0 %   Abs Immature Granulocytes 0.07 0.00 - 0.07 K/uL    Comment: Performed at [facility], [address] [place], [place] [address]  CK     Status: None   Collection Time: 02/05/2019 10:00 AM  Result Value Ref Range   Total CK 198 49 - 397 U/L    Comment: Performed at [facility], [address] [place], [place] [address]  Hemoglobin A1c     Status: Abnormal   Collection Time: 02/06/2019 10:00 AM  Result Value Ref Range   Hgb A1c MFr Bld 6.2 (H) 4.8 - 5.6 %    Comment: (NOTE) Pre diabetes:          5.7%-6.4% Diabetes:              >6.4% Glycemic control for   <7.0% adults with diabetes  Mean Plasma Glucose 131.24 mg/dL    Comment: Performed at [facility] [address] [place], [place] [address]  Protime-INR     Status: Abnormal   Collection Time: 01/27/2019 10:46 AM  Result Value Ref Range   Prothrombin Time 18.1 (H) 11.4 - 15.2 seconds   INR 1.5 (H) 0.8 - 1.2    Comment: (NOTE) INR goal varies based on device and disease states. Performed at [facility], [address] [place], [place] [address]   APTT     Status: Abnormal   Collection Time: 02/09/2019 10:46 AM  Result Value Ref Range   aPTT 42 (H) 24 - 36 seconds    Comment:        IF BASELINE aPTT IS ELEVATED, SUGGEST PATIENT RISK ASSESSMENT BE USED TO DETERMINE  APPROPRIATE ANTICOAGULANT THERAPY. Performed at [facility], [address] [place],  [address]   SARS Coronavirus 2 (Performed in [facility] hospital lab)     Status: None   Collection Time: 01/31/2019 12:29 PM   Specimen: Nasopharyngeal Swab  Result Value Ref Range   SARS Coronavirus 2 NEGATIVE NEGATIVE    Comment: (NOTE) If result is NEGATIVE SARS-CoV-2 target nucleic acids are NOT DETECTED. The SARS-CoV-2 RNA is generally detectable in upper and lower  respiratory specimens during the acute phase of infection. The lowest  concentration of SARS-CoV-2 viral copies this assay can detect is 250  copies / mL. A negative result does not preclude SARS-CoV-2 infection  and should not be used as the sole basis for treatment or other  patient management decisions.  A negative result may occur with  improper specimen collection / handling, submission of specimen other  than nasopharyngeal swab, presence of viral mutation(s) within the  areas targeted by this assay, and inadequate number of viral copies  (<250 copies / mL). A negative result must be combined with clinical  observations, patient history, and epidemiological information. If result is POSITIVE SARS-CoV-2 target nucleic acids are DETECTED. The SARS-CoV-2 RNA is generally detectable in upper and lower  respiratory specimens dur ing the acute phase of infection.  Positive  results are indicative of active infection with SARS-CoV-2.  Clinical  correlation with patient history and other diagnostic information is  necessary to determine patient infection status.  Positive results do  not rule out bacterial infection or co-infection with other viruses. If result is PRESUMPTIVE POSTIVE SARS-CoV-2 nucleic acids MAY BE PRESENT.   A presumptive positive result was obtained on the submitted specimen  and confirmed on repeat testing.  While 2019 novel coronavirus  (SARS-CoV-2) nucleic acids may be present in the  submitted sample  additional confirmatory testing may be necessary for epidemiological  and / or clinical management purposes  to differentiate between  SARS-CoV-2 and other Sarbecovirus currently known to infect humans.  If clinically indicated additional testing with an alternate test  methodology [phone]) is advised. The SARS-CoV-2 RNA is generally  detectable in upper and lower respiratory sp ecimens during the acute  phase of infection. The expected result is Negative. Fact Sheet for Patients:  StrictlyIdeas.no Fact Sheet for Healthcare Providers: BankingDealers.co.za This test is not yet approved or cleared by the [place] FDA and has been authorized for detection and/or diagnosis of SARS-CoV-2 by FDA under an Emergency Use Authorization (EUA).  This EUA will remain in effect (meaning this test can be used) for the duration of the COVID-19 declaration under Section 564(b)(1) of the Act,  21 U.S.C. section 360bbb-3(b)(1), unless the authorization is terminated or revoked sooner. Performed at [facility], [address] [place], [place] [address]   Basic metabolic panel     Status: Abnormal   Collection Time: 02/11/19  3:25 AM  Result Value Ref Range   Sodium 131 (L) 135 - 145 mmol/L   Potassium 3.3 (L) 3.5 - 5.1 mmol/L   Chloride 91 (L) 98 - 111 mmol/L   CO2 26 22 - 32 mmol/L   Glucose, Bld 110 (H) 70 - 99 mg/dL   BUN 71 (H) 8 - 23 mg/dL   Creatinine, Ser 9.65 (H) 0.61 - 1.24 mg/dL   Calcium 8.7 (L) 8.9 - 10.3 mg/dL   GFR calc non Af Amer 5 (L) >60 mL/min   GFR calc Af Amer 5 (L) >60 mL/min   Anion gap 14 5 - 15    Comment: Performed at [facility], [address] [place], [place] [address]  CBC     Status: Abnormal   Collection Time: 02/11/19  3:25 AM  Result Value Ref Range   WBC 13.5 (H) 4.0 - 10.5 K/uL   RBC 3.02 (L) 4.22 - 5.81 MIL/uL   Hemoglobin 9.3 (L) 13.0 - 17.0 g/dL   HCT 28.5 (L) 39.0  - 52.0 %   MCV 94.4 80.0 - 100.0 fL   MCH 30.8 26.0 - 34.0 pg   MCHC 32.6 30.0 - 36.0 g/dL   RDW 19.7 (H) 11.5 - 15.5 %   Platelets 177 150 - 400 K/uL   nRBC 0.0 0.0 - 0.2 %    Comment: Performed at [facility], [place] [address] [place], [place] [address]  Heparin level (unfractionated)     Status: Abnormal   Collection Time: 02/11/19  3:25 AM  Result Value Ref Range   Heparin Unfractionated >2.20 (H) 0.30 - 0.70 IU/mL    Comment: RESULTS CONFIRMED BY MANUAL DILUTION (NOTE) If heparin results are below expected values, and patient dosage has  been confirmed, suggest follow up testing of antithrombin III levels. Performed at [facility], [place] [address], [place], [place] [address]   Heparin level (unfractionated)     Status: Abnormal   Collection Time: 02/11/19  8:07 AM  Result Value Ref Range   Heparin Unfractionated 1.24 (H) 0.30 - 0.70 IU/mL    Comment: RESULTS CONFIRMED BY MANUAL DILUTION (NOTE) If heparin results are below expected values, and patient dosage has  been confirmed, suggest follow up testing of antithrombin III levels. Performed at [facility], [place] [address], [place], [place] [address]     Dg Chest Port 1 View  Result Date: 01/16/2019 CLINICAL DATA:  78 year old male with history of unwitnessed fall today. Generalized fatigue. EXAM: PORTABLE CHEST 1 VIEW COMPARISON:  Chest x-ray 12/09/2018. FINDINGS: Lung volumes are normal. No consolidative airspace disease. No pleural effusions. No evidence of pulmonary edema. Moderate cardiomegaly (unchanged). Upper mediastinal contours are within normal limits. Aortic atherosclerosis. IMPRESSION: 1.  No radiographic evidence of acute cardiopulmonary disease. 2. Moderate cardiomegaly (unchanged). 3. Aortic atherosclerosis. Electronically Signed   By: Vinnie Langton M.D.   On: 01/23/2019 09:53    Review of Systems  Constitutional: Negative for weight loss.  HENT: Negative for ear discharge, ear pain,  hearing loss and tinnitus.   Eyes: Negative for blurred vision, double vision, photophobia and pain.  Respiratory: Negative for cough, sputum production and shortness of breath.   Cardiovascular: Negative for chest  pain.  Gastrointestinal: Negative for abdominal pain, nausea and vomiting.  Genitourinary: Negative for dysuria, flank pain, frequency and urgency.  Musculoskeletal: Positive for myalgias (Bilateral legs, L>>R). Negative for back pain, falls, joint pain and neck pain.  Neurological: Positive for seizures. Negative for dizziness, tingling, sensory change, focal weakness, loss of consciousness and headaches.  Endo/Heme/Allergies: Does not bruise/bleed easily.  Psychiatric/Behavioral: Negative for depression, memory loss and substance abuse. The patient is not nervous/anxious.    Blood pressure 108/62, pulse (!) 56, temperature 98.1 F (36.7 C), temperature source Oral, resp. rate 20, height 6\' 3"  (1.905 m), weight 125.1 kg, SpO2 99 %. Physical Exam  Constitutional: He appears well-developed and well-nourished. No distress.  HENT:  Head: Normocephalic and atraumatic.  Eyes: Conjunctivae are normal. Right eye exhibits no discharge. Left eye exhibits no discharge. No scleral icterus.  Neck: Normal range of motion.  Cardiovascular: Normal rate and regular rhythm.  Respiratory: Effort normal. No respiratory distress.  Musculoskeletal:     Comments: RLE No traumatic wounds, ecchymosis, or rash  Mild erythema and TTP distal medial thigh  No knee or ankle effusion  Knee stable to varus/ valgus and anterior/posterior stress  Sens DPN, SPN, TN intact  Motor EHL, ext, flex, evers 5/5  DP 0, PT 0, No significant edema  LLE No traumatic wounds, ecchymosis, or rash  Extensive posterior leg wounds with mild surrounding erythema, all covered with eschar, mild TTP, foul odor emanating from calf portion  No knee or ankle effusion  Knee stable to varus/ valgus and anterior/posterior  stress  Sens DPN, SPN, TN intact  Motor EHL, ext, flex, evers 5/5  DP 0, PT 0, No significant edema  Neurological: He is alert.  Skin: Skin is warm and dry. He is not diaphoretic.  Psychiatric: He has a normal mood and affect. His behavior is normal.    Assessment/Plan: Left lower extremity wounds -- Agree with MRI to assess extent of involvement. Will also check ABI's. Dr. Sharol Given to evaluate later today or tomorrow as I suspect he will need some sort of debridement. May have diet today. Multiple medical problems including ESRD on MWF HD, hypertension, CHF, A. fib on Coumadin, chronic zoster on Valtrex, chronic combined CHF, and recent diagnosis of seizures -- per primary service    Lisette Abu, PA-C Orthopedic Surgery [phone] 02/11/2019, 9:57 AM

## 2019-02-11 NOTE — Progress Notes (Addendum)
PROGRESS NOTE    Bruce Cole   WGY:[id number]  DOB: March 17, 1941  DOA: 01/26/2019 PCP: Debbrah Alar, NP   Brief Narrative:  Bruce Cole is a 78 y/o male with ESRD, HTN, combined CHF, A-fib on Coumadin, DM2, RH Arthritis, OSA seizures who presents after a fall at home.  In ED - temp 99, WBC 15, right leg and thigh cellultis and leg necrosis Started on Vanc and Zosyn.   Subjective: He has no complaints today.     Assessment & Plan:   Principal Problem:   Cellulitis with low grade fever, leukocytosis and lactic acidosis - extending from lower left leg around a prior injury which is not healing up to the thigh  - necrotic tissue noted as well along with foul smell - failed outpt antibiotics - continue IV antibiotics - Dr Doreatha Martin has been consulted to see if surgical intervention is needed - ABI and MRI is pending as well  Active Problems: A-fib on Coumadin - INR currently is subtherapeutic at 1.5 - bridging with Heparin- pharmacy managing both Heparin and coumadin - currently rate controlled   DM (diabetes mellitus), type 2 with renal complications  - on Victoza, Levemir as outpt - SSI ordered-- CBG 110 this AM-  follow for need to add long acting insulin     Chronic combined systolic and diastolic heart failure     ESRD on dialysis  - fluid management with dialysis  Hypotension - cont Midodrine  Hypokalemia - to be replaced with dialysis  Rheumatoid arthritis - resume Plaquenil   Gout - uses PRN Colchicine    Seizure disorder - resume Keppra   Time spent in minutes: 35 DVT prophylaxis: Heparin infusion Code Status: DNR Family Communication:  Disposition Plan: cont to treat infection Consultants:   ortho Procedures:   none Antimicrobials:  Anti-infectives (From admission, onward)   Start     Dose/Rate Route Frequency Ordered Stop   02/11/19 1200  vancomycin (VANCOCIN) IVPB 1000 mg/200 mL premix     1,000 mg 200 mL/hr over 60  Minutes Intravenous Every M-W-F (Hemodialysis) 01/31/2019 1844     02/11/19 1030  hydroxychloroquine (PLAQUENIL) tablet 200 mg     200 mg Oral Daily 02/11/19 1021     02/11/19 0800  piperacillin-tazobactam (ZOSYN) IVPB 3.375 g     3.375 g 12.5 mL/hr over 240 Minutes Intravenous Every 12 hours 01/20/2019 1844     01/21/2019 1800  piperacillin-tazobactam (ZOSYN) IVPB 3.375 g     3.375 g 100 mL/hr over 30 Minutes Intravenous  Once 01/31/2019 1735 02/01/2019 1913   02/06/2019 1800  vancomycin (VANCOCIN) 2,000 mg in sodium chloride 0.9 % 500 mL IVPB     2,000 mg 250 mL/hr over 120 Minutes Intravenous  Once 02/14/2019 1735 02/04/2019 2042   01/20/2019 1130  ceFAZolin (ANCEF) IVPB 1 g/50 mL premix     1 g 100 mL/hr over 30 Minutes Intravenous  Once 02/12/2019 1128 02/11/2019 1222      Objective: Vitals:   02/15/2019 1652 01/26/2019 2111 01/17/2019 2200 02/11/19 0358  BP:  94/66  108/62  Pulse: (!) 53 (!) 50 (!) 58 (!) 56  Resp: (!) 28 (!) 24 20 20   Temp:  98.7 F (37.1 C)  98.1 F (36.7 C)  TempSrc:  Oral  Oral  SpO2: 100% 98%  99%  Weight:      Height:        Intake/Output Summary (Last 24 hours) at 02/11/2019 1021 Last data filed at 02/11/2019 [phone]  Gross per 24 hour  Intake 925.64 ml  Output -  Net 925.64 ml   Filed Weights   01/17/2019 0926 01/16/2019 1633  Weight: 128.4 kg 125.1 kg    Examination: General exam: Appears comfortable  HEENT: PERRLA, oral mucosa moist, no sclera icterus or thrush Respiratory system: Clear to auscultation. Respiratory effort normal. Cardiovascular system: S1 & S2 heard, RRR.   Gastrointestinal system: Abdomen soft, non-tender, nondistended. Normal bowel sounds. Central nervous system: Alert and oriented. No focal neurological deficits. Extremities: No cyanosis, clubbing or edema Psychiatry:  Mood & affect appropriate.  Skin: No rashes or ulcers         Data Reviewed: I have personally reviewed following labs and imaging studies  CBC: Recent Labs  Lab  02/09/2019 1000 02/11/19 0325  WBC 15.7* 13.5*  NEUTROABS 13.6*  --   HGB 10.0* 9.3*  HCT 31.5* 28.5*  MCV 96.9 94.4  PLT 180 314   Basic Metabolic Panel: Recent Labs  Lab 02/07/2019 1000 02/11/19 0325  NA 133* 131*  K 3.6 3.3*  CL 89* 91*  CO2 25 26  GLUCOSE 132* 110*  BUN 67* 71*  CREATININE 8.78* 9.65*  CALCIUM 9.4 8.7*   GFR: Estimated Creatinine Clearance: 9.4 mL/min (A) (by C-G formula based on SCr of 9.65 mg/dL (H)). Liver Function Tests: Recent Labs  Lab 02/15/2019 1000  AST 30  ALT 18  ALKPHOS 269*  BILITOT 1.2  PROT 7.9  ALBUMIN 3.3*   No results for input(s): LIPASE, AMYLASE in the last 168 hours. No results for input(s): AMMONIA in the last 168 hours. Coagulation Profile: Recent Labs  Lab 01/28/2019 1046  INR 1.5*   Cardiac Enzymes: Recent Labs  Lab 01/16/2019 1000  CKTOTAL 198   BNP (last 3 results) No results for input(s): PROBNP in the last 8760 hours. HbA1C: Recent Labs    02/03/2019 1000  HGBA1C 6.2*   CBG: No results for input(s): GLUCAP in the last 168 hours. Lipid Profile: No results for input(s): CHOL, HDL, LDLCALC, TRIG, CHOLHDL, LDLDIRECT in the last 72 hours. Thyroid Function Tests: No results for input(s): TSH, T4TOTAL, FREET4, T3FREE, THYROIDAB in the last 72 hours. Anemia Panel: No results for input(s): VITAMINB12, FOLATE, FERRITIN, TIBC, IRON, RETICCTPCT in the last 72 hours. Urine analysis:    Component Value Date/Time   COLORURINE YELLOW 11/10/2015 1800   APPEARANCEUR CLEAR 11/10/2015 1800   LABSPEC 1.012 11/10/2015 1800   PHURINE 5.5 11/10/2015 1800   GLUCOSEU NEGATIVE 11/10/2015 1800   GLUCOSEU NEGATIVE 05/05/2015 1135   HGBUR NEGATIVE 11/10/2015 1800   HGBUR negative 09/21/2009 1008   BILIRUBINUR NEGATIVE 11/10/2015 1800   KETONESUR NEGATIVE 11/10/2015 1800   PROTEINUR NEGATIVE 11/10/2015 1800   UROBILINOGEN 0.2 05/05/2015 1135   NITRITE NEGATIVE 11/10/2015 1800   LEUKOCYTESUR NEGATIVE 11/10/2015 1800   Sepsis  Labs: @LABRCNTIP (procalcitonin:4,lacticidven:4) ) Recent Results (from the past 240 hour(s))  Blood Culture (routine x 2)     Status: None (Preliminary result)   Collection Time: 01/16/2019  9:30 AM   Specimen: BLOOD RIGHT ARM  Result Value Ref Range Status   Specimen Description   Final    BLOOD RIGHT ARM Performed at [facility], [address] [place], [place] [address]    Special Requests   Final    BOTTLES DRAWN AEROBIC AND ANAEROBIC Blood Culture adequate volume Performed at [facility], [address] [place], [place] [address]    Culture   Final  NO GROWTH < 24 HOURS Performed at [facility] [address] [place], [place] [address]    Report Status PENDING  Incomplete  Blood Culture (routine x 2)     Status: None (Preliminary result)   Collection Time: 02/05/2019  9:50 AM   Specimen: BLOOD RIGHT HAND  Result Value Ref Range Status   Specimen Description   Final    BLOOD RIGHT HAND Performed at [facility], [address] [place], [place] [address]    Special Requests   Final    BOTTLES DRAWN AEROBIC AND ANAEROBIC Blood Culture results may not be optimal due to an inadequate volume of blood received in culture bottles Performed at [facility], [address] [place], [place] [address]    Culture   Final    NO GROWTH < 24 HOURS Performed at [facility], [place] [address] [place], [place] [address]    Report Status PENDING  Incomplete  SARS Coronavirus 2 (Performed in [place] hospital lab)     Status: None   Collection Time: 01/26/2019 12:29 PM   Specimen: Nasopharyngeal Swab  Result Value Ref Range Status   SARS Coronavirus 2 NEGATIVE NEGATIVE Final    Comment: (NOTE) If result is NEGATIVE SARS-CoV-2 target nucleic acids are NOT DETECTED. The SARS-CoV-2 RNA is generally detectable in upper and lower  respiratory specimens during the acute phase of infection. The lowest  concentration of SARS-CoV-2  viral copies this assay can detect is 250  copies / mL. A negative result does not preclude SARS-CoV-2 infection  and should not be used as the sole basis for treatment or other  patient management decisions.  A negative result may occur with  improper specimen collection / handling, submission of specimen other  than nasopharyngeal swab, presence of viral mutation(s) within the  areas targeted by this assay, and inadequate number of viral copies  (<250 copies / mL). A negative result must be combined with clinical  observations, patient history, and epidemiological information. If result is POSITIVE SARS-CoV-2 target nucleic acids are DETECTED. The SARS-CoV-2 RNA is generally detectable in upper and lower  respiratory specimens dur ing the acute phase of infection.  Positive  results are indicative of active infection with SARS-CoV-2.  Clinical  correlation with patient history and other diagnostic information is  necessary to determine patient infection status.  Positive results do  not rule out bacterial infection or co-infection with other viruses. If result is PRESUMPTIVE POSTIVE SARS-CoV-2 nucleic acids MAY BE PRESENT.   A presumptive positive result was obtained on the submitted specimen  and confirmed on repeat testing.  While 2019 novel coronavirus  (SARS-CoV-2) nucleic acids may be present in the submitted sample  additional confirmatory testing may be necessary for epidemiological  and / or clinical management purposes  to differentiate between  SARS-CoV-2 and other Sarbecovirus currently known to infect humans.  If clinically indicated additional testing with an alternate test  methodology [phone]) is advised. The SARS-CoV-2 RNA is generally  detectable in upper and lower respiratory sp ecimens during the acute  phase of infection. The expected result is Negative. Fact Sheet for Patients:  StrictlyIdeas.no Fact Sheet for Healthcare Providers:  BankingDealers.co.za This test is not yet approved or cleared by the [place] FDA and has been authorized for detection and/or diagnosis of SARS-CoV-2 by FDA under an Emergency Use Authorization (EUA).  This EUA will remain in effect (meaning this test can be used)  for the duration of the COVID-19 declaration under Section 564(b)(1) of the Act, 21 U.S.C. section 360bbb-3(b)(1), unless the authorization is terminated or revoked sooner. Performed at [facility], [address], [place], [place] [address]          Radiology Studies: Dg Chest [place] 1 View  Result Date: 01/22/2019 CLINICAL DATA:  78 year old male with history of unwitnessed fall today. Generalized fatigue. EXAM: PORTABLE CHEST 1 VIEW COMPARISON:  Chest x-ray 12/09/2018. FINDINGS: Lung volumes are normal. No consolidative airspace disease. No pleural effusions. No evidence of pulmonary edema. Moderate cardiomegaly (unchanged). Upper mediastinal contours are within normal limits. Aortic atherosclerosis. IMPRESSION: 1.  No radiographic evidence of acute cardiopulmonary disease. 2. Moderate cardiomegaly (unchanged). 3. Aortic atherosclerosis. Electronically Signed   By: Vinnie Langton M.D.   On: 02/14/2019 09:53      Scheduled Meds: . atorvastatin  10 mg Oral Daily  . ferric citrate  420 mg Oral TID WC  . FLUoxetine  20 mg Oral Daily  . hydroxychloroquine  200 mg Oral Daily  . insulin aspart  0-15 Units Subcutaneous TID WC  . liraglutide  0.6 mg Subcutaneous Daily  . midodrine  10 mg Oral Q M,W,F-HD  . midodrine  5 mg Oral BID WC  . multivitamin  1 tablet Oral QHS  . pantoprazole  40 mg Oral Daily  . sevelamer carbonate  2,400 mg Oral TID WC  . ticagrelor  90 mg Oral BID  . vitamin B-12  1,000 mcg Oral Daily   Continuous Infusions: . heparin 1,600 Units/hr (02/11/19 0755)  . piperacillin-tazobactam (ZOSYN)  IV 3.375 g (02/11/19 0759)  . vancomycin       LOS: 1 day       Debbe Odea, MD [facility] Pager: [email or web address] Password [facility] 02/11/2019, 10:21 AM

## 2019-02-11 NOTE — Plan of Care (Signed)
  Problem: Education: Goal: Knowledge of General Education information will improve Description Including pain rating scale, medication(s)/side effects and non-pharmacologic comfort measures Outcome: Progressing   

## 2019-02-11 NOTE — Telephone Encounter (Signed)
FYI, when you speak to home health- please let them know that he is in the hospital.

## 2019-02-11 NOTE — Consult Note (Signed)
[place] [facility] Nurse wound consult note Patient receiving care in [place]. Reason for Consult: "pressure ulcers" Wound type: There is a small stage 2 injury to the right buttock that measures 1 cm x 2 cm, is 100% pink, clean, and has a foam dressing over it.  Uncertain of the etiology of the area. I question if this started as an abrasion. Pressure Injury POA: Yes The left leg is darkly erythematous from the foot to the knee.  There is +3 pitting edema to the LLE and +3 to +4 to the RLE.  The RLE is not as darkly erythematous as the LLE.  I cannot palpate a dorsalis pedis pulse on the LLE.  The LLE has extensive blackened tissue along the posterior LE and scattered areas along the pretibial area.  The patient is a HD patient.  There is a heavy foul odor from the leg.  The patient denies pain to the leg.  I see that Dr. Sharol Given will evaluate the patient and the leg today.  The etiology is unclear, but possible related to arterial insufficiency; or, perhaps calciphylaxis?  I defer the care and treatment of the LLE to the medical and surgical specialists over seeing his care. For the right buttock wound, continue the use of a foam dressing.  Change every 3 days and prn. Monitor the wound area(s) for worsening of condition such as: Signs/symptoms of infection,  Increase in size,  Development of or worsening of odor, Development of pain, or increased pain at the affected locations.  Notify the medical team if any of these develop.  Thank you for the consult.  Discussed plan of care with the patient.  [place] nurse will not follow at this time.  Please re-consult the [place] team if needed.  Val Riles, RN, MSN, CWOCN, CNS-BC, pager [phone]

## 2019-02-11 NOTE — Progress Notes (Signed)
Renal Navigator notified OP HD clinic/SW of patient's admission and negative COVID 19 rapid test result in order to provide continuity of care and safety.  Drake Wuertz Elizabeth, LCSW Renal Navigator [phone] 

## 2019-02-11 NOTE — Telephone Encounter (Signed)
Please advise 

## 2019-02-11 NOTE — Telephone Encounter (Signed)
Addendum: late entry, I spoke with home health RN last week in regards to wound and gave wound care orders. He will continue to provide wound care pending further instructions from the wound care consult.

## 2019-02-11 NOTE — Telephone Encounter (Signed)
Yes, ok to give verbal order to increase visits.

## 2019-02-11 NOTE — Telephone Encounter (Signed)
Bruce Cole, did you contact the wound center and were you able to get him an appointment?

## 2019-02-11 NOTE — Consult Note (Signed)
ORTHOPAEDIC CONSULTATION  REQUESTING PHYSICIAN: Debbe Odea, MD  Chief Complaint: Cellulitis swelling pain and ulceration left lower extremity.  HPI: Bruce Cole is a 78 y.o. male who presents with acute trauma to the left calf.  Patient states he felt an acute tear which sounds like it was in the region of the medial head of the gastrocnemius muscle tendon junction.  Patient states he has had swelling in his legs for prolonged period of time.  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  Patient states he has had longstanding history of swelling in his legs and has had his legs drained in the past after blunt trauma.  Past Medical History:  Diagnosis Date   Acute blood loss anemia    Atrial fibrillation (HCC)    Chronic   Chronic combined systolic and diastolic heart failure (HCC)    Colon polyp 2000   Dysrhythmia    hx   ESRD on hemodialysis ([place])    Hemo [facility]S- [place]   Essential hypertension    Gastritis and gastroduodenitis    with bleeding   Gout    Heart murmur    Herpes    History of blood transfusion    Nephrolithiasis    OSA (obstructive sleep apnea) 09/02/2013    IMPRESSION :  1. Mild obstructive sleep apnea with hypopneas causing sleep fragmentation and moderate oxygen desaturation.  2. Short runs of nonsustained VT were noted. His beta blocker may need to be titrated 3. Significant PLM's were noted, the PLM arousal index was low. Please correlate with clinical history of restless leg syndrome.  4. Sleep efficiency was poor.  RECOMMENDATION:  1. Treatment options for this degree of sleep disordered breathing include weight loss and positional therapy to avoid supine sleep 2. Consider titrating beta blocker further, defer to cardiologist 3. Patient should be cautioned against driving when sleepy.They should be asked to avoid  medications with sedative side effects      Osteoarthritis of right hip    Pneumonia    hx 30 yrs ago   Primary osteoarthritis of right hip    Rheumatoid arthritis ([place])    Seizures ([place])    Thrombocytopenia ([place])    Past Surgical History:  Procedure Laterality Date   AV FISTULA PLACEMENT Left 08/26/2015   Procedure: LEFT RADIOCEPHALIC FISTULA CREATION;  Surgeon: Rosetta Posner, MD;  Location: [facility];  Service: Vascular;  Laterality: Left;   AV FISTULA PLACEMENT Left 11/23/2015   Procedure: ARTERIOVENOUS (AV) FISTULA CREATION;  Surgeon: Rosetta Posner, MD;  Location: [facility];  Service: Vascular;  Laterality: Left;   BACK SURGERY     x2- discectomy   CHOLECYSTECTOMY  1994   CYSTOSCOPY/RETROGRADE/URETEROSCOPY/STONE EXTRACTION WITH BASKET     ESOPHAGOGASTRODUODENOSCOPY N/A 11/13/2015   Procedure: ESOPHAGOGASTRODUODENOSCOPY (EGD);  Surgeon: Irene Shipper, MD;  [facility] ENDOSCOPY;  Service: Endoscopy;  Laterality: N/A;   INSERTION OF DIALYSIS CATHETER Left 11/23/2015   Procedure: INSERTION OF DIALYSIS CATHETER;  Surgeon: Rosetta Posner, MD;  Location: [facility];  Service: Vascular;  Laterality: Left;   IR GENERIC HISTORICAL Left 08/04/2016   IR DIALY SHUNT INTRO NEEDLE/INTRACATH INITIAL W/IMG LEFT 08/04/2016 Markus Daft, MD [facility]INTER[facility]   IR GENERIC HISTORICAL  08/04/2016   IR US GUIDE VASC ACCESS LEFT 08/04/2016 Markus Daft, MD [facility]   JOINT REPLACEMENT  Total L-Hip replacement, Right Knee 10/20/09   LEFT AND RIGHT HEART CATHETERIZATION WITH CORONARY ANGIOGRAM N/A 02/22/2013   Procedure: LEFT AND RIGHT HEART CATHETERIZATION WITH CORONARY ANGIOGRAM;  Surgeon: Jolaine Artist, MD;  Location: [facility] CATH LAB;  Service: Cardiovascular;  Laterality: N/A;   LITHOTRIPSY  90's   PERIPHERAL VASCULAR CATHETERIZATION Left 11/19/2015   Procedure: A/V/Fistulagram;  Surgeon: Conrad [place], MD;  Location: [facility] CV LAB;  Service: Cardiovascular;  Laterality: Left;   PERIPHERAL VASCULAR  CATHETERIZATION Left 07/21/2016   Procedure: A/V Fistulagram;  Surgeon: Conrad Allen, MD;  Location: [facility] CV LAB;  Service: Cardiovascular;  Laterality: Left;  arm   REVISON OF ARTERIOVENOUS FISTULA Left 05/30/2016   Procedure: REVISION LEFT UPPER ARM FISTULA;  Surgeon: Conrad Petersburg, MD;  Location: [facility];  Service: Vascular;  Laterality: Left;   REVISON OF ARTERIOVENOUS FISTULA Left 07/22/2016   Procedure: REVISON OF BASILIC VEIN TRANSPOSITION ANASTOMOSIS;  Surgeon: Rosetta Posner, MD;  Location: [facility];  Service: Vascular;  Laterality: Left;   SPINE SURGERY     x 2   TOTAL HIP ARTHROPLASTY Right 03/22/2016   Procedure: RIGHT TOTAL HIP ARTHROPLASTY ANTERIOR APPROACH;  Surgeon: Mcarthur Rossetti, MD;  Location: [facility];  Service: Orthopedics;  Laterality: Right;   Social History   Socioeconomic History   Marital status: Single    Spouse name: Not on file   Number of children: 3   Years of education: Not on file   Highest education level: Not on file  Occupational History   Occupation: retired Personnel officer: [employer] resource strain: Not on file   Food insecurity    Worry: Not on file    Inability: Not on file   Transportation needs    Medical: Not on file    Non-medical: Not on file  Tobacco Use   Smoking status: Never Smoker   Smokeless tobacco: Never Used  Substance and Sexual Activity   Alcohol use: No    Comment: occasional   Drug use: No   Sexual activity: Not on file  Lifestyle   Physical activity    Days per week: Not on file    Minutes per session: Not on file   Stress: Not on file  Relationships   Social connections    Talks on phone: Not on file    Gets together: Not on file    Attends religious service: Not on file    Active member of club or organization: Not on file    Attends meetings of clubs or organizations: Not on file    Relationship status: Not on file  Other Topics Concern   Not on  file  Social History Narrative   Former [employer]   Admitted to [facility] 11/25/15   Widowed   Never smoked   FULL CODE   Right handed    2 level home stays on 1st level    Family History  Problem Relation Age of Onset   Hypertension Mother    Arthritis Mother        ?RA   Hypertension Father    - negative except otherwise stated in the family history section Allergies  Allergen Reactions   Neurontin [Gabapentin] Other (See Comments)    Encephalopathy with myoclonic jerking   Ace Inhibitors Other (See Comments)    Worsening renal insufficiency   Prior to Admission medications  Medication Sig Start Date End Date Taking? Authorizing Provider  albuterol (VENTOLIN HFA) 108 (90 Base) MCG/ACT inhaler Inhale 1 puff into the lungs every 4 (four) hours as needed for wheezing or shortness of breath.   Yes [provider]  atorvastatin (LIPITOR) 10 MG tablet TAKE 1 TABLET(10 MG) BY MOUTH DAILY Patient taking differently: Take 10 mg by mouth daily.  12/12/18  Yes Debbrah Alar, NP  B Complex-C-Folic Acid (RENA-VITE RX) 1 MG TABS Take 1 tablet by mouth at bedtime.  02/18/16  Yes [provider]  colchicine 0.6 MG tablet Take 1/2 tablet by mouth once as needed for gout flare. Call if no improvement 01/02/18  Yes Debbrah Alar, NP  dorzolamide (TRUSOPT) 2 % ophthalmic solution Place 1 drop into both eyes 2 (two) times daily.   Yes [provider]  FLUoxetine (PROZAC) 20 MG capsule Take 20 mg by mouth daily.   Yes [provider]  hydroxychloroquine (PLAQUENIL) 200 MG tablet Take 200 mg by mouth daily.   Yes [provider]  hydrOXYzine (ATARAX/VISTARIL) 25 MG tablet Take 1 tablet (25 mg total) by mouth every 8 (eight) hours as needed for itching. 12/13/18  Yes Mikhail, Velta Addison, DO  insulin detemir (LEVEMIR) 100 UNIT/ML injection Inject 15 Units into the skin daily.   Yes [provider]  latanoprost  (XALATAN) 0.005 % ophthalmic solution Place 1 drop into both eyes at bedtime.   Yes [provider]  levETIRAcetam (KEPPRA) 500 MG tablet Take 1 tablet by mouth once daily after dialysis (only take on dialysis days) 01/10/19  Yes Debbrah Alar, NP  liraglutide (VICTOZA) 18 MG/3ML SOPN Inject 0.6 mg into the skin daily.   Yes [provider]  midodrine (PROAMATINE) 10 MG tablet Take 1 tablet (10 mg total) by mouth every Monday, Wednesday, and Friday with hemodialysis. 12/13/18  Yes Mikhail, Velta Addison, DO  midodrine (PROAMATINE) 5 MG tablet Take 1 tablet (5 mg total) by mouth 2 (two) times daily with a meal. 12/13/18  Yes Mikhail, Velta Addison, DO  omeprazole (PRILOSEC OTC) 20 MG tablet Take 20 mg by mouth daily as needed (heartburn).   Yes [provider]  pantoprazole (PROTONIX) 40 MG tablet Take 1 tablet (40 mg total) by mouth daily. Patient taking differently: Take 20 mg by mouth daily.  02/07/19  Yes Debbrah Alar, NP  RENVELA 800 MG tablet Take 3 tablets (2,400 mg total) by mouth 3 (three) times daily with meals. 07/23/16  Yes Lavina Hamman, MD  SANTYL ointment Apply 1 application topically daily. Patient taking differently: Apply 1 application topically daily as needed (wound care).  01/17/19 02/16/19 Yes Debbrah Alar, NP  ticagrelor (BRILINTA) 90 MG TABS tablet Take 90 mg by mouth 2 (two) times daily. As directed   Yes [provider]  vitamin B-12 (CYANOCOBALAMIN) 1000 MCG tablet Take 1,000 mcg by mouth daily.   Yes [provider]  warfarin (COUMADIN) 5 MG tablet Take 5 mg by mouth daily.  12/31/18  Yes [provider]  ferric citrate (AURYXIA) 1 GM 210 MG(Fe) tablet Take 2 tablets (420 mg total) by mouth 3 (three) times daily with meals. Patient not taking: Reported on 02/11/2019 12/13/18   Cristal Ford, DO  Nutritional Supplements (FEEDING SUPPLEMENT, NEPRO CARB STEADY,) LIQD Take 237 mLs by mouth 3 (three) times daily as needed  (Supplement). Patient not taking: Reported on 01/08/2019 12/13/18   Cristal Ford, DO   Mr Tibia Fibula Left Wo Contrast  Result Date: 02/11/2019 CLINICAL DATA:  Left lower extremity redness, swelling EXAM: MRI OF LOWER LEFT EXTREMITY WITHOUT CONTRAST TECHNIQUE: Multiplanar, multisequence MR imaging of the left lower extremity from the proximal tibial metaphysis to the hindfoot was performed. No intravenous contrast was administered. COMPARISON:  None. FINDINGS: Bones/Joint/Cartilage No acute fracture. No malalignment. No abnormal cortical signal or rotation. No bone marrow edema. Mild degenerative changes of the tibiotalar and subtalar joints. No focal talar dome osteochondral lesion. Ligaments Grossly intact, suboptimally evaluated. Muscles and Tendons There is extensive atrophy and fatty infiltration throughout the lower leg musculature. Mild edema-like intramuscular signal most pronounced within the flexor hallucis longus and soleus muscles. No intramuscular fluid collections. Mild thickening and intermediate signal within the distal left Achilles tendon without tear. Remaining ankle tendons appear grossly intact. No significant tenosynovial fluid. Soft tissues Near circumferential subcutaneous edema, most pronounced over the anteromedial aspect of the distal tibial metadiaphysis. No well-defined or drainable fluid collections. No significant deep fascial edema. The included portions of the contralateral right lower extremity reveals similar findings. IMPRESSION: 1. Extensive subcutaneous edema and mild intramuscular edema of the left lower extremity suggesting a nonspecific cellulitis and myositis. No well-defined or drainable fluid collections. No significant deep fascial edema. 2. No marrow signal abnormality to suggest acute osteomyelitis. 3. Similar findings are identified within the visualized portion of the contralateral right lower extremity. 4. Left Achilles tendinosis without tear. Electronically  Signed   By: Davina Poke M.D.   On: 02/11/2019 16:54   Dg Chest Port 1 View  Result Date: 01/18/2019 CLINICAL DATA:  78 year old male with history of unwitnessed fall today. Generalized fatigue. EXAM: PORTABLE CHEST 1 VIEW COMPARISON:  Chest x-ray 12/09/2018. FINDINGS: Lung volumes are normal. No consolidative airspace disease. No pleural effusions. No evidence of pulmonary edema. Moderate cardiomegaly (unchanged). Upper mediastinal contours are within normal limits. Aortic atherosclerosis. IMPRESSION: 1.  No radiographic evidence of acute cardiopulmonary disease. 2. Moderate cardiomegaly (unchanged). 3. Aortic atherosclerosis. Electronically Signed   By: Vinnie Langton M.D.   On: 02/04/2019 09:53   - pertinent xrays, CT, MRI studies were reviewed and independently interpreted  Positive ROS: All other systems have been reviewed and were otherwise negative with the exception of those mentioned in the HPI and as above.  Physical Exam: General: Alert, no acute distress Psychiatric: Patient is competent for consent with normal mood and affect Lymphatic: No axillary or cervical lymphadenopathy Cardiovascular: No pedal edema Respiratory: No cyanosis, no use of accessory musculature GI: No organomegaly, abdomen is soft and non-tender    Images:  @ENCIMAGES @  Labs:  Lab Results  Component Value Date   HGBA1C 6.2 (H) 02/07/2019   HGBA1C 6.2 04/10/2018   HGBA1C 6.4 01/02/2018   LABURIC 5.7 02/20/2017   LABURIC 6.6 08/29/2016   LABURIC 5.5 05/31/2010   REPTSTATUS PENDING 02/15/2019   GRAMSTAIN  11/22/2015    FEW WBC PRESENT,BOTH PMN AND MONONUCLEAR NO ORGANISMS SEEN    CULT  02/13/2019    NO GROWTH < 24 HOURS Performed at [facility], [place] [address], [place], [place] [address]    LABORGA NO GROWTH 05/05/2015    Lab Results  Component Value Date   ALBUMIN 3.3 (L) 02/12/2019   ALBUMIN 2.8 (L) 12/12/2018   ALBUMIN 2.8 (L) 12/10/2018   LABURIC 5.7 02/20/2017    LABURIC 6.6 08/29/2016   LABURIC 5.5 05/31/2010    Neurologic: Patient does not have protective sensation bilateral lower extremities.   MUSCULOSKELETAL:   Skin: Examination patient has cellulitis and areas of thin black  eschar over the gastrocnemius muscle posterior left calf.  Patient has significant pitting edema in both lower extremities.  I cannot palpate a pulse.  Review of the MRI scan does show edema possibly consistent with a tear of the medial head of the gastrocnemius muscle.  No signs of osteomyelitis or abscess. Currently on vancomycin and Zosyn for the cellulitis.  Assessment: Assessment: Diabetic insensate neuropathy with end-stage renal disease with venous insufficiency with ulceration and cellulitis left calf possibly secondary to a tear of the medial head of gastrocnemius muscle.  Patient is also have history of protein caloric malnutrition.  Plan: Plan: I will obtain ankle-brachial indices of both lower extremities.  Anticipate patient should have sufficient circulation for compression treatment for both lower extremities.  I will follow-up after the ABIs obtained for further treatment.  Continue IV antibiotics for the cellulitis.  Thank you for the consult and the opportunity to see Bruce Cole, [place] [phone] 5:37 PM

## 2019-02-12 ENCOUNTER — Inpatient Hospital Stay (HOSPITAL_COMMUNITY): Payer: Medicare Other

## 2019-02-12 DIAGNOSIS — R569 Unspecified convulsions: Secondary | ICD-10-CM

## 2019-02-12 DIAGNOSIS — I96 Gangrene, not elsewhere classified: Secondary | ICD-10-CM

## 2019-02-12 LAB — HEPARIN LEVEL (UNFRACTIONATED)
Heparin Unfractionated: 0.27 IU/mL — ABNORMAL LOW (ref 0.30–0.70)
Heparin Unfractionated: 0.14 IU/mL — ABNORMAL LOW (ref 0.30–0.70)

## 2019-02-12 LAB — GLUCOSE, CAPILLARY
Glucose-Capillary: 77 mg/dL (ref 70–99)
Glucose-Capillary: 130 mg/dL — ABNORMAL HIGH (ref 70–99)
Glucose-Capillary: 112 mg/dL — ABNORMAL HIGH (ref 70–99)

## 2019-02-12 LAB — RENAL FUNCTION PANEL
Albumin: 2.7 g/dL — ABNORMAL LOW (ref 3.5–5.0)
Anion gap: 19 — ABNORMAL HIGH (ref 5–15)
BUN: 88 mg/dL — ABNORMAL HIGH (ref 8–23)
CO2: 21 mmol/L — ABNORMAL LOW (ref 22–32)
Calcium: 8.9 mg/dL (ref 8.9–10.3)
Chloride: 90 mmol/L — ABNORMAL LOW (ref 98–111)
Creatinine, Ser: 11.17 mg/dL — ABNORMAL HIGH (ref 0.61–1.24)
GFR calc Af Amer: 5 mL/min — ABNORMAL LOW (ref >60)
GFR calc non Af Amer: 4 mL/min — ABNORMAL LOW (ref >60)
Glucose, Bld: 126 mg/dL — ABNORMAL HIGH (ref 70–99)
Phosphorus: 4 mg/dL (ref 2.5–4.6)
Potassium: 3.7 mmol/L (ref 3.5–5.1)
Sodium: 130 mmol/L — ABNORMAL LOW (ref 135–145)

## 2019-02-12 LAB — CBC
HCT: 29.5 % — ABNORMAL LOW (ref 39.0–52.0)
Hemoglobin: 9.6 g/dL — ABNORMAL LOW (ref 13.0–17.0)
MCH: 30.2 pg (ref 26.0–34.0)
MCHC: 32.5 g/dL (ref 30.0–36.0)
MCV: 92.8 fL (ref 80.0–100.0)
Platelets: 193 10*3/uL (ref 150–400)
RBC: 3.18 MIL/uL — ABNORMAL LOW (ref 4.22–5.81)
RDW: 19.4 % — ABNORMAL HIGH (ref 11.5–15.5)
WBC: 13.4 10*3/uL — ABNORMAL HIGH (ref 4.0–10.5)
nRBC: 0 % (ref 0.0–0.2)

## 2019-02-12 LAB — PROTIME-INR
INR: 1.6 — ABNORMAL HIGH (ref 0.8–1.2)
Prothrombin Time: 18.8 seconds — ABNORMAL HIGH (ref 11.4–15.2)

## 2019-02-12 MED ORDER — ALBUMIN HUMAN 25 % IV SOLN
INTRAVENOUS | Status: AC
  Administered 2019-02-12: 25 g via INTRAVENOUS
  Filled 2019-02-12: qty 100

## 2019-02-12 MED ORDER — ALBUMIN HUMAN 25 % IV SOLN
25.0000 g | Freq: Once | INTRAVENOUS | Status: AC
  Administered 2019-02-12: 25 g via INTRAVENOUS

## 2019-02-12 MED ORDER — VANCOMYCIN HCL IN DEXTROSE 1-5 GM/200ML-% IV SOLN
INTRAVENOUS | Status: AC
  Administered 2019-02-12: 1000 mg via INTRAVENOUS
  Filled 2019-02-12: qty 200

## 2019-02-12 MED ORDER — VANCOMYCIN HCL IN DEXTROSE 1-5 GM/200ML-% IV SOLN
1000.0000 mg | INTRAVENOUS | Status: DC
  Administered 2019-02-12 – 2019-02-14 (×3): 1000 mg via INTRAVENOUS
  Filled 2019-02-12: qty 200

## 2019-02-12 MED ORDER — DARBEPOETIN ALFA 60 MCG/0.3ML IJ SOSY
PREFILLED_SYRINGE | INTRAMUSCULAR | Status: AC
  Administered 2019-02-12: 60 ug via INTRAVENOUS
  Filled 2019-02-12: qty 0.3

## 2019-02-12 MED ORDER — LEVETIRACETAM 250 MG PO TABS
250.0000 mg | ORAL_TABLET | ORAL | Status: DC
  Administered 2019-02-12 – 2019-02-14 (×2): 250 mg via ORAL
  Filled 2019-02-12 (×2): qty 1

## 2019-02-12 NOTE — Progress Notes (Addendum)
PROGRESS NOTE    Bruce Cole   FVC:[id number]  DOB: 08-01-1940  DOA: 01/29/2019 PCP: Debbrah Alar, NP   Brief Narrative:  Bruce Cole is a 78 y/o male with ESRD, HTN, combined CHF, A-fib on Coumadin, DM2, RH Arthritis, OSA seizures who presents after a fall at home.  In ED - temp 99, WBC 15, right leg and thigh cellultis and necrosis. Started on Vanc and Zosyn.   Subjective:  He states that his legs are still painful. He has not other complaints.     Assessment & Plan:   Principal Problem:   Cellulitis with necrosis, low grade fever, leukocytosis and mild lactic acidosis - extending from lower left leg around a prior injury which is not healing up to the thigh  - necrotic tissue noted on both legs along with foul smell - failed outpt antibiotics - continue IV antibiotics- Vanc/Zosyn - Dr Sharol Given has evaluated him- no recommendations for surgery as of yet - ABI pending -  MRI > Extensive subcutaneous edema and mild intramuscular edema of the left lower extremity suggesting a nonspecific cellulitis and myositis.Similar findings are identified within the visualized portion of the contralateral right lower extremity. Left Achilles tendinosis without tear.  Active Problems: A-fib on Coumadin CHA2DS2-VASc Score at least 5 - INR currently is subtherapeutic at 1.5- f/u on results today - bridging with Heparin- pharmacy managing both Heparin and Coumadin - currently rate controlled - Addendum: INR 1.6- Heparin held for now as he had extensive bleeding from fistula after dialysis- f/u on Heparin level - will eventually resume Heparin today as long as he is no longer bleeding   DM (diabetes mellitus), type 2 with renal complications  - on Victoza, Levemir as outpt - Victoza and SSI ordered--   follow for need to add long acting insulin     Chronic combined systolic and diastolic heart failure     ESRD on dialysis on MWF - fluid management with dialysis- Nephrology  following and he was being dialyzed this AM  AOCD - receiving Iron and Darbepoetin with dialysis  Hypotension - cont Midodrine  Hypokalemia - to be replaced with dialysis  Rheumatoid arthritis - cont Plaquenil   Gout - uses PRN Colchicine    Seizure disorder - cont Keppra  NOTE: on Brillinta- I am not sure why  Time spent in minutes: 35 DVT prophylaxis: Heparin infusion Code Status: DNR Family Communication:  Disposition Plan: cont to treat infection Consultants:   ortho Procedures:   none Antimicrobials:  Anti-infectives (From admission, onward)   Start     Dose/Rate Route Frequency Ordered Stop   02/12/19 1200  vancomycin (VANCOCIN) IVPB 1000 mg/200 mL premix     1,000 mg 200 mL/hr over 60 Minutes Intravenous Every T-Th-Sa (Hemodialysis) 02/12/19 0810     02/11/19 1200  vancomycin (VANCOCIN) IVPB 1000 mg/200 mL premix  Status:  Discontinued     1,000 mg 200 mL/hr over 60 Minutes Intravenous Every M-W-F (Hemodialysis) 01/29/2019 1844 02/12/19 0810   02/11/19 1030  hydroxychloroquine (PLAQUENIL) tablet 200 mg     200 mg Oral Daily 02/11/19 1021     02/11/19 0800  piperacillin-tazobactam (ZOSYN) IVPB 3.375 g     3.375 g 12.5 mL/hr over 240 Minutes Intravenous Every 12 hours 02/09/2019 1844     01/27/2019 1800  piperacillin-tazobactam (ZOSYN) IVPB 3.375 g     3.375 g 100 mL/hr over 30 Minutes Intravenous  Once 01/28/2019 1735 02/11/2019 1913   01/16/2019 1800  vancomycin (VANCOCIN)  2,000 mg in sodium chloride 0.9 % 500 mL IVPB     2,000 mg 250 mL/hr over 120 Minutes Intravenous  Once 02/06/2019 1735 01/19/2019 2042   01/25/2019 1130  ceFAZolin (ANCEF) IVPB 1 g/50 mL premix     1 g 100 mL/hr over 30 Minutes Intravenous  Once 02/09/2019 1128 02/14/2019 1222      Objective: Vitals:   02/12/19 0745 02/12/19 0800 02/12/19 0830 02/12/19 0900  BP: 103/74 (!) 84/58 (!) 98/58 (!) 94/54  Pulse: (!) 49 (!) 50 (!) 52 (!) 47  Resp:      Temp:      TempSrc:      SpO2:      Weight:       Height:        Intake/Output Summary (Last 24 hours) at 02/12/2019 0920 Last data filed at 02/12/2019 0130 Gross per 24 hour  Intake 564 ml  Output 3 ml  Net 561 ml   Filed Weights   02/15/2019 0926 02/11/2019 1633 02/12/19 0720  Weight: 128.4 kg 125.1 kg 126 kg    Examination:  General exam: Appears comfortable  HEENT: PERRLA, oral mucosa moist, no sclera icterus or thrush Respiratory system: Clear to auscultation. Respiratory effort normal. Cardiovascular system: S1 & S2 heard,  No murmurs  Gastrointestinal system: Abdomen soft, non-tender, nondistended. Normal bowel sounds   Central nervous system: Alert and oriented. No focal neurological deficits. Extremities: No cyanosis, clubbing or edema Skin: decreasing erythema of left leg- continues to have swelling and necrotic patches of skin on both legs Psychiatry:  Mood & affect appropriate.    Data Reviewed: I have personally reviewed following labs and imaging studies  CBC: Recent Labs  Lab 01/21/2019 1000 02/11/19 0325 02/12/19 0257  WBC 15.7* 13.5* 13.4*  NEUTROABS 13.6*  --   --   HGB 10.0* 9.3* 9.6*  HCT 31.5* 28.5* 29.5*  MCV 96.9 94.4 92.8  PLT 180 177 517   Basic Metabolic Panel: Recent Labs  Lab 01/23/2019 1000 02/11/19 0325 02/12/19 0257  NA 133* 131* 130*  K 3.6 3.3* 3.7  CL 89* 91* 90*  CO2 25 26 21*  GLUCOSE 132* 110* 126*  BUN 67* 71* 88*  CREATININE 8.78* 9.65* 11.17*  CALCIUM 9.4 8.7* 8.9  PHOS  --   --  4.0   GFR: Estimated Creatinine Clearance: 8.2 mL/min (A) (by C-G formula based on SCr of 11.17 mg/dL (H)). Liver Function Tests: Recent Labs  Lab 01/23/2019 1000 02/12/19 0257  AST 30  --   ALT 18  --   ALKPHOS 269*  --   BILITOT 1.2  --   PROT 7.9  --   ALBUMIN 3.3* 2.7*   No results for input(s): LIPASE, AMYLASE in the last 168 hours. No results for input(s): AMMONIA in the last 168 hours. Coagulation Profile: Recent Labs  Lab 01/18/2019 1046  INR 1.5*   Cardiac Enzymes: Recent  Labs  Lab 02/14/2019 1000  CKTOTAL 198   BNP (last 3 results) No results for input(s): PROBNP in the last 8760 hours. HbA1C: Recent Labs    01/27/2019 1000  HGBA1C 6.2*   CBG: Recent Labs  Lab 02/11/19 1300 02/11/19 1709 02/11/19 2056  GLUCAP 156* 98 137*   Lipid Profile: No results for input(s): CHOL, HDL, LDLCALC, TRIG, CHOLHDL, LDLDIRECT in the last 72 hours. Thyroid Function Tests: No results for input(s): TSH, T4TOTAL, FREET4, T3FREE, THYROIDAB in the last 72 hours. Anemia Panel: No results for input(s): VITAMINB12, FOLATE, FERRITIN, TIBC,  IRON, RETICCTPCT in the last 72 hours. Urine analysis:    Component Value Date/Time   COLORURINE YELLOW 11/10/2015 1800   APPEARANCEUR CLEAR 11/10/2015 1800   LABSPEC 1.012 11/10/2015 1800   PHURINE 5.5 11/10/2015 1800   GLUCOSEU NEGATIVE 11/10/2015 1800   GLUCOSEU NEGATIVE 05/05/2015 1135   HGBUR NEGATIVE 11/10/2015 1800   HGBUR negative 09/21/2009 1008   BILIRUBINUR NEGATIVE 11/10/2015 1800   KETONESUR NEGATIVE 11/10/2015 1800   PROTEINUR NEGATIVE 11/10/2015 1800   UROBILINOGEN 0.2 05/05/2015 1135   NITRITE NEGATIVE 11/10/2015 1800   LEUKOCYTESUR NEGATIVE 11/10/2015 1800   Sepsis Labs: @LABRCNTIP (procalcitonin:4,lacticidven:4) ) Recent Results (from the past 240 hour(s))  Blood Culture (routine x 2)     Status: None (Preliminary result)   Collection Time: 02/09/2019  9:30 AM   Specimen: BLOOD RIGHT ARM  Result Value Ref Range Status   Specimen Description   Final    BLOOD RIGHT ARM Performed at [facility], [address] [place], [place] [address]    Special Requests   Final    BOTTLES DRAWN AEROBIC AND ANAEROBIC Blood Culture adequate volume Performed at [facility], [address] [place], [place] [address]    Culture   Final    NO GROWTH 2 DAYS Performed at [facility], [place] [address], [place], [place] [address]    Report Status PENDING  Incomplete  Blood Culture (routine x 2)      Status: None (Preliminary result)   Collection Time: 01/31/2019  9:50 AM   Specimen: BLOOD RIGHT HAND  Result Value Ref Range Status   Specimen Description   Final    BLOOD RIGHT HAND Performed at [facility], [address] [place], [place] [address]    Special Requests   Final    BOTTLES DRAWN AEROBIC AND ANAEROBIC Blood Culture results may not be optimal due to an inadequate volume of blood received in culture bottles Performed at [facility], [address] [place], [place] [address]    Culture   Final    NO GROWTH 2 DAYS Performed at [facility], [place] [address], [place], [place] [address]    Report Status PENDING  Incomplete  SARS Coronavirus 2 (Performed in [place] hospital lab)     Status: None   Collection Time: 01/17/2019 12:29 PM   Specimen: Nasopharyngeal Swab  Result Value Ref Range Status   SARS Coronavirus 2 NEGATIVE NEGATIVE Final    Comment: (NOTE) If result is NEGATIVE SARS-CoV-2 target nucleic acids are NOT DETECTED. The SARS-CoV-2 RNA is generally detectable in upper and lower  respiratory specimens during the acute phase of infection. The lowest  concentration of SARS-CoV-2 viral copies this assay can detect is 250  copies / mL. A negative result does not preclude SARS-CoV-2 infection  and should not be used as the sole basis for treatment or other  patient management decisions.  A negative result may occur with  improper specimen collection / handling, submission of specimen other  than nasopharyngeal swab, presence of viral mutation(s) within the  areas targeted by this assay, and inadequate number of viral copies  (<250 copies / mL). A negative result must be combined with clinical  observations, patient history, and epidemiological information. If result is POSITIVE SARS-CoV-2 target nucleic acids are DETECTED. The SARS-CoV-2 RNA is generally detectable in upper and lower  respiratory specimens dur ing the acute phase  of infection.  Positive  results are indicative of active infection with SARS-CoV-2.  Clinical  correlation with patient history and other diagnostic information is  necessary to determine patient infection status.  Positive results do  not rule out bacterial infection or co-infection with other viruses. If result is PRESUMPTIVE POSTIVE SARS-CoV-2 nucleic acids MAY BE PRESENT.   A presumptive positive result was obtained on the submitted specimen  and confirmed on repeat testing.  While 2019 novel coronavirus  (SARS-CoV-2) nucleic acids may be present in the submitted sample  additional confirmatory testing may be necessary for epidemiological  and / or clinical management purposes  to differentiate between  SARS-CoV-2 and other Sarbecovirus currently known to infect humans.  If clinically indicated additional testing with an alternate test  methodology [phone]) is advised. The SARS-CoV-2 RNA is generally  detectable in upper and lower respiratory sp ecimens during the acute  phase of infection. The expected result is Negative. Fact Sheet for Patients:  StrictlyIdeas.no Fact Sheet for Healthcare Providers: BankingDealers.co.za This test is not yet approved or cleared by the [place] FDA and has been authorized for detection and/or diagnosis of SARS-CoV-2 by FDA under an Emergency Use Authorization (EUA).  This EUA will remain in effect (meaning this test can be used) for the duration of the COVID-19 declaration under Section 564(b)(1) of the Act, 21 U.S.C. section 360bbb-3(b)(1), unless the authorization is terminated or revoked sooner. Performed at [facility], [address] [place], [place] [address]          Radiology Studies: Mr Tibia Fibula Left Wo Contrast  Result Date: 02/11/2019 CLINICAL DATA:  Left lower extremity redness, swelling EXAM: MRI OF LOWER LEFT EXTREMITY WITHOUT CONTRAST TECHNIQUE:  Multiplanar, multisequence MR imaging of the left lower extremity from the proximal tibial metaphysis to the hindfoot was performed. No intravenous contrast was administered. COMPARISON:  None. FINDINGS: Bones/Joint/Cartilage No acute fracture. No malalignment. No abnormal cortical signal or rotation. No bone marrow edema. Mild degenerative changes of the tibiotalar and subtalar joints. No focal talar dome osteochondral lesion. Ligaments Grossly intact, suboptimally evaluated. Muscles and Tendons There is extensive atrophy and fatty infiltration throughout the lower leg musculature. Mild edema-like intramuscular signal most pronounced within the flexor hallucis longus and soleus muscles. No intramuscular fluid collections. Mild thickening and intermediate signal within the distal left Achilles tendon without tear. Remaining ankle tendons appear grossly intact. No significant tenosynovial fluid. Soft tissues Near circumferential subcutaneous edema, most pronounced over the anteromedial aspect of the distal tibial metadiaphysis. No well-defined or drainable fluid collections. No significant deep fascial edema. The included portions of the contralateral right lower extremity reveals similar findings. IMPRESSION: 1. Extensive subcutaneous edema and mild intramuscular edema of the left lower extremity suggesting a nonspecific cellulitis and myositis. No well-defined or drainable fluid collections. No significant deep fascial edema. 2. No marrow signal abnormality to suggest acute osteomyelitis. 3. Similar findings are identified within the visualized portion of the contralateral right lower extremity. 4. Left Achilles tendinosis without tear. Electronically Signed   By: Davina Poke M.D.   On: 02/11/2019 16:54   Dg Chest Port 1 View  Result Date: 01/28/2019 CLINICAL DATA:  78 year old male with history of unwitnessed fall today. Generalized fatigue. EXAM: PORTABLE CHEST 1 VIEW COMPARISON:  Chest x-ray 12/09/2018.  FINDINGS: Lung volumes are normal. No consolidative airspace disease. No pleural effusions. No evidence of pulmonary edema. Moderate cardiomegaly (unchanged). Upper mediastinal contours are within normal limits. Aortic atherosclerosis. IMPRESSION: 1.  No radiographic evidence of acute cardiopulmonary  disease. 2. Moderate cardiomegaly (unchanged). 3. Aortic atherosclerosis. Electronically Signed   By: Vinnie Langton M.D.   On: 02/05/2019 09:53      Scheduled Meds: . atorvastatin  10 mg Oral Daily  . Chlorhexidine Gluconate Cloth  6 each Topical Q0600  . darbepoetin (ARANESP) injection - DIALYSIS  60 mcg Intravenous Q Tue-HD  . feeding supplement (PRO-STAT SUGAR FREE 64)  30 mL Oral BID  . ferric citrate  420 mg Oral TID WC  . FLUoxetine  20 mg Oral Daily  . hydroxychloroquine  200 mg Oral Daily  . insulin aspart  0-15 Units Subcutaneous TID WC  . liraglutide  0.6 mg Subcutaneous Daily  . midodrine  10 mg Oral Q M,W,F-HD  . midodrine  5 mg Oral BID WC  . multivitamin  1 tablet Oral QHS  . pantoprazole  40 mg Oral Daily  . sevelamer carbonate  2,400 mg Oral TID WC  . ticagrelor  90 mg Oral BID  . vitamin B-12  1,000 mcg Oral Daily   Continuous Infusions: . heparin 2,350 Units/hr (02/12/19 0354)  . piperacillin-tazobactam (ZOSYN)  IV 3.375 g (02/11/19 2111)  . vancomycin       LOS: 2 days      Debbe Odea, MD [facility] Pager: [email or web address] Password [email or web address] 02/12/2019, 9:20 AM

## 2019-02-12 NOTE — Progress Notes (Signed)
ABI's have been completed. Preliminary results can be found in CV Proc through chart review.   02/12/19 3:47 PM Bruce Cole RVT

## 2019-02-12 NOTE — Progress Notes (Signed)
Patient off unit for dialysis.

## 2019-02-12 NOTE — Progress Notes (Signed)
[place] for Heparin Indication: atrial fibrillation  Allergies  Allergen Reactions  . Neurontin [Gabapentin] Other (See Comments)    Encephalopathy with myoclonic jerking  . Ace Inhibitors Other (See Comments)    Worsening renal insufficiency    Patient Measurements: Height: 6\' 3"  (190.5 cm) Weight: 275 lb 12.7 oz (125.1 kg) IBW/kg (Calculated) : 84.5 Heparin Dosing Weight: 111.5   Vital Signs: Temp: 97.4 F (36.3 C) (07/27 2053) Temp Source: Oral (07/27 2053) BP: 96/55 (07/27 2053) Pulse Rate: 51 (07/27 2053)  Labs: Recent Labs    01/27/2019 1000 02/02/2019 1046 02/11/19 0325  02/11/19 1003 02/11/19 1907 02/12/19 0257  HGB 10.0*  --  9.3*  --   --   --  9.6*  HCT 31.5*  --  28.5*  --   --   --  29.5*  PLT 180  --  177  --   --   --  193  APTT  --  42*  --   --   --   --   --   LABPROT  --  18.1*  --   --   --   --   --   INR  --  1.5*  --   --   --   --   --   HEPARINUNFRC  --   --  >2.20*   < > <0.10* 0.23* 0.27*  CREATININE 8.78*  --  9.65*  --   --   --  11.17*  CKTOTAL 198  --   --   --   --   --   --    < > = values in this interval not displayed.    Estimated Creatinine Clearance: 8.1 mL/min (A) (by C-G formula based on SCr of 11.17 mg/dL (H)).  Assessment: Pt is a 78 y/o male with PMH of ESRD on HD TuThSat, HTN, CHF, A-fib (on warfarin PTA), recurrent zoster, recurrent falls x3 in last week, RLE cellulitis, and recent diagnosis of new seizures on Keppra who was discharged from a skilled facility last week. Pt [facility] on 07/[facility] after having a fall while going to the bathroom. Pt was down for approx. 5 hours before he was found down. Pt denies hitting his head or losing consciousness. He denies having any tremors, seizure like activity, or recent changes in medications. Pt was on warfarin at home PTA with an admit INR of 1.5. Pharmacy has been consulted to dose heparin while warfarin is on hold pending possible LP.    Heparin level remains slightly below goal at 0.27, no issues per RN, CBC stable.  Of note, patient is now having heparin levels drawn through a foot stick, due to limited access (ok'd with Dr. Wynelle Cleveland).  Goal of Therapy:  Heparin level 0.3-0.7 units/ml Monitor platelets by anticoagulation protocol: Yes   Plan:  Increase heparin infusion to 2350 units/hr Recheck heparin level in 8hr Heparin level, CBC with AM labs and daily Monitor for signs/symptoms of bleeding   Arrie Senate, PharmD, BCPS Clinical Pharmacist Please check AMION for all [place] numbers 02/12/2019

## 2019-02-12 NOTE — Progress Notes (Signed)
At approximately 1310 lab technician was at bedside to draw labs, called RN into room. Patient bleeding from dialysis catheter site onto bed, saturating dressing in place and pooling onto bed. This RN applied immediate pressure to site, placed heparin drip on hold and called for assistance. Patient was alert and oriented with no complaints or distress. Continuous pressure was applied for approximately 40 minutes until patient no longer bleeding from dialysis site. New dressing applied to patient arm at dialysis fistula. MD was notified and ordered to coordinate care of heparin drip with pharmacy. Will continue to monitor closely for remainder of shift.

## 2019-02-12 NOTE — Progress Notes (Signed)
Held arterial site for 25 minutes. Pt currently on heparin drip as documented.

## 2019-02-12 NOTE — Progress Notes (Signed)
While waiting on transport pt. AVF spontaneously starting bleeding pt. Continues with heparin drip as documented. Upon assessment pt. Arm bent dressing saturated. Dressing removed to assess further and noted arterial site to be bleeding dressing reapplied pressure held for 15 minutes. No additional bleeding noted.

## 2019-02-12 NOTE — Progress Notes (Signed)
Spoke with son, Cephus Shelling, on phone and updated on patient, currently at dialysis.

## 2019-02-12 NOTE — Progress Notes (Signed)
This nurse walk with transport to return pt. To room to monitor for bleeding with AVF. No bleeding noted during transportation.Primary nurse Aquilla Hacker called to patient room upon arrival to assess AVF site noted with dressing dry and intact. Pt. Continues with heparin drip as documented. Pt. Educated on not applying pressure to left extremity due to increase risk of bleeding. Pt. stable

## 2019-02-12 NOTE — Progress Notes (Signed)
[facility] Progress Note   Subjective:   Seen on HD. Complains of ongoing ankle pain. MRI completed yesterday showed edema possibly consistent with tear of medial head of gastrocnemius muscle but no osteomyelitis. Per ortho notes, plan is for ABIs. Patient denies SOB, cough, dyspnea, CP, dizziness, abdominal pain, N/V/D. BP soft on HD, albumin ordered for pressure support.   Objective Vitals:   02/12/19 0729 02/12/19 0745 02/12/19 0800 02/12/19 0830  BP: (!) 89/55 103/74 (!) 84/58 (!) 98/58  Pulse: (!) 46 (!) 49 (!) 50 (!) 52  Resp:      Temp:      TempSrc:      SpO2:      Weight:      Height:       Physical Exam General: Well developed male, alert and in NAD Heart: RRR, no murmurs, rubs or gallops Lungs: CTA bilaterally without wheezing, rhonchi or rales Abdomen: Soft, non-tender. +BS Extremities: 2+ edema bilateral lower extremities.  LLE with extensive wound on posterior leg Dialysis Access: RUE AVF currently cannulated  Additional Objective Labs: Basic Metabolic Panel: Recent Labs  Lab 01/26/2019 1000 02/11/19 0325 02/12/19 0257  NA 133* 131* 130*  K 3.6 3.3* 3.7  CL 89* 91* 90*  CO2 25 26 21*  GLUCOSE 132* 110* 126*  BUN 67* 71* 88*  CREATININE 8.78* 9.65* 11.17*  CALCIUM 9.4 8.7* 8.9  PHOS  --   --  4.0   Liver Function Tests: Recent Labs  Lab 02/12/2019 1000 02/12/19 0257  AST 30  --   ALT 18  --   ALKPHOS 269*  --   BILITOT 1.2  --   PROT 7.9  --   ALBUMIN 3.3* 2.7*   CBC: Recent Labs  Lab 01/27/2019 1000 02/11/19 0325 02/12/19 0257  WBC 15.7* 13.5* 13.4*  NEUTROABS 13.6*  --   --   HGB 10.0* 9.3* 9.6*  HCT 31.5* 28.5* 29.5*  MCV 96.9 94.4 92.8  PLT 180 177 193   Blood Culture    Component Value Date/Time   SDES  01/23/2019 0950    BLOOD RIGHT HAND Performed at [facility], [address] [place], [place] [address]    [facility]  02/15/2019 0950    BOTTLES DRAWN AEROBIC AND ANAEROBIC Blood Culture results  may not be optimal due to an inadequate volume of blood received in culture bottles Performed at [facility], [address] [place], [place] [address]    CULT  01/26/2019 0950    NO GROWTH 2 DAYS Performed at [facility], [place] [address], [place], [place] [address]    REPTSTATUS PENDING 01/24/2019 0950    Cardiac Enzymes: Recent Labs  Lab 01/20/2019 1000  CKTOTAL 198   CBG: Recent Labs  Lab 02/11/19 1300 02/11/19 1709 02/11/19 2056  GLUCAP 156* 98 137*   Iron Studies: No results for input(s): IRON, TIBC, TRANSFERRIN, FERRITIN in the last 72 hours. @lablastinr3 @ Studies/Results: Mr Tibia Fibula Left Wo Contrast  Result Date: 02/11/2019 CLINICAL DATA:  Left lower extremity redness, swelling EXAM: MRI OF LOWER LEFT EXTREMITY WITHOUT CONTRAST TECHNIQUE: Multiplanar, multisequence MR imaging of the left lower extremity from the proximal tibial metaphysis to the hindfoot was performed. No intravenous contrast was administered. COMPARISON:  None. FINDINGS: Bones/Joint/Cartilage No acute fracture. No malalignment. No abnormal cortical signal or rotation. No bone marrow edema. Mild degenerative changes of the tibiotalar and subtalar joints. No focal talar dome osteochondral lesion. Ligaments Grossly intact,  suboptimally evaluated. Muscles and Tendons There is extensive atrophy and fatty infiltration throughout the lower leg musculature. Mild edema-like intramuscular signal most pronounced within the flexor hallucis longus and soleus muscles. No intramuscular fluid collections. Mild thickening and intermediate signal within the distal left Achilles tendon without tear. Remaining ankle tendons appear grossly intact. No significant tenosynovial fluid. Soft tissues Near circumferential subcutaneous edema, most pronounced over the anteromedial aspect of the distal tibial metadiaphysis. No well-defined or drainable fluid collections. No significant deep fascial edema. The included  portions of the contralateral right lower extremity reveals similar findings. IMPRESSION: 1. Extensive subcutaneous edema and mild intramuscular edema of the left lower extremity suggesting a nonspecific cellulitis and myositis. No well-defined or drainable fluid collections. No significant deep fascial edema. 2. No marrow signal abnormality to suggest acute osteomyelitis. 3. Similar findings are identified within the visualized portion of the contralateral right lower extremity. 4. Left Achilles tendinosis without tear. Electronically Signed   By: Davina Poke M.D.   On: 02/11/2019 16:54   Dg Chest Port 1 View  Result Date: 01/31/2019 CLINICAL DATA:  78 year old male with history of unwitnessed fall today. Generalized fatigue. EXAM: PORTABLE CHEST 1 VIEW COMPARISON:  Chest x-ray 12/09/2018. FINDINGS: Lung volumes are normal. No consolidative airspace disease. No pleural effusions. No evidence of pulmonary edema. Moderate cardiomegaly (unchanged). Upper mediastinal contours are within normal limits. Aortic atherosclerosis. IMPRESSION: 1.  No radiographic evidence of acute cardiopulmonary disease. 2. Moderate cardiomegaly (unchanged). 3. Aortic atherosclerosis. Electronically Signed   By: Vinnie Langton M.D.   On: 01/20/2019 09:53   Medications: . heparin 2,350 Units/hr (02/12/19 0354)  . piperacillin-tazobactam (ZOSYN)  IV 3.375 g (02/11/19 2111)  . vancomycin     . atorvastatin  10 mg Oral Daily  . Chlorhexidine Gluconate Cloth  6 each Topical Q0600  . darbepoetin (ARANESP) injection - DIALYSIS  60 mcg Intravenous Q Tue-HD  . feeding supplement (PRO-STAT SUGAR FREE 64)  30 mL Oral BID  . ferric citrate  420 mg Oral TID WC  . FLUoxetine  20 mg Oral Daily  . hydroxychloroquine  200 mg Oral Daily  . insulin aspart  0-15 Units Subcutaneous TID WC  . liraglutide  0.6 mg Subcutaneous Daily  . midodrine  10 mg Oral Q M,W,F-HD  . midodrine  5 mg Oral BID WC  . multivitamin  1 tablet Oral QHS  .  pantoprazole  40 mg Oral Daily  . sevelamer carbonate  2,400 mg Oral TID WC  . ticagrelor  90 mg Oral BID  . vitamin B-12  1,000 mcg Oral Daily    Dialysis Orders: Center: [facility]  on MWF. 2K/ 2.25 Ca; EDW 123.5kg (last post weight +3.4kg); LUE AVF Heparin 2000 unit bolus Mircera 174mcg IV q 2 weeks  Renvela 800mg  3 tabs PO TID  Assessment/Plan: 1. Cellulitis: Left lower extremity, seen by ortho. MRI completed yesterday without osteomyelitis, plan is for ABIs per ortho.  Failed outpatient treatment. On vancomycin and zosyn. Per primary/ortho. Also has pressure injury on right buttocks, wound care following.  2.  ESRD:  Normally dialyzes MWF at [facility], dialyzing today off schedule due to high patient load. Continue TTS schedule for now. Last HD was 7/24. K+ 3.7, on 3K bath. On heparin infusion, will not give heparin bolus with HD if still on continuous infusion. 3.  Hypertension/volume: Blood pressure chronically low. On midodrine. Giving albumin today for BP support per Dr. Royce Macadamia. Chest x-ray yesterday without acute cardiopulmonary disease. Currently 2.5kg  above EDW, UFG today is 3L as tolerated by blood pressure. 4.  Anemia: Hemoglobin 9.6. Due for ESA today, continue aranesp 29mcg IV q week with dialysis.  5.  Metabolic bone disease: Calcium 8.9. Last outpatient phos 3.7, PTH 241. Continue renvela.  6.  Nutrition:  Albumin 3.3. Continue pro-stat supplement.  7. A. Fib on coumadin: INR reportedly suptherapeutic, bridging with heparin. Per primary. 8. T2DM: Currently in SSI, per primary.  9. Seizure disorder: On Keppra  Anice Paganini, PA-C 02/12/2019, 8:59 AM  [facility] Pager: [phone]

## 2019-02-12 NOTE — Progress Notes (Signed)
[place] for Heparin Indication: atrial fibrillation  Allergies  Allergen Reactions  . Neurontin [Gabapentin] Other (See Comments)    Encephalopathy with myoclonic jerking  . Ace Inhibitors Other (See Comments)    Worsening renal insufficiency    Patient Measurements: Height: 6\' 3"  (190.5 cm) Weight: 271 lb 2.7 oz (123 kg) IBW/kg (Calculated) : 84.5 Heparin Dosing Weight: 111.5   Vital Signs: Temp: 97.8 F (36.6 C) (07/28 2147) Temp Source: Oral (07/28 2147) BP: 97/61 (07/28 2147) Pulse Rate: 52 (07/28 2147)  Labs: Recent Labs    02/05/2019 1000 02/08/2019 1046 02/11/19 0325  02/11/19 1907 02/12/19 0257 02/12/19 0924 02/12/19 2131  HGB 10.0*  --  9.3*  --   --  9.6*  --   --   HCT 31.5*  --  28.5*  --   --  29.5*  --   --   PLT 180  --  177  --   --  193  --   --   APTT  --  42*  --   --   --   --   --   --   LABPROT  --  18.1*  --   --   --   --  18.8*  --   INR  --  1.5*  --   --   --   --  1.6*  --   HEPARINUNFRC  --   --  >2.20*   < > 0.23* 0.27*  --  0.14*  CREATININE 8.78*  --  9.65*  --   --  11.17*  --   --   CKTOTAL 198  --   --   --   --   --   --   --    < > = values in this interval not displayed.    Estimated Creatinine Clearance: 8.1 mL/min (A) (by C-G formula based on SCr of 11.17 mg/dL (H)).  Assessment: Pt is a 78 y/o male with PMH of ESRD on HD TuThSat, HTN, CHF, A-fib (on warfarin PTA), recurrent zoster, recurrent falls x3 in last week, RLE cellulitis, and recent diagnosis of new seizures on Keppra who was discharged from a skilled facility last week. Pt presented to [facility] on 07/25 after having a fall while going to the bathroom. Pt was down for approx. 5 hours before he was found down. Pt denies hitting his head or losing consciousness. He denies having any tremors, seizure like activity, or recent changes in medications. Pt was on warfarin at home PTA with an admit INR of 1.5. Pharmacy has been consulted to dose  heparin while warfarin is on hold pending possible LP.   Heparin level low  0.27 this am and drip rate increased 2350 uts/hr  Bleeding from HD cath site today, pressure held x7min and now resolved, heparin turned off for some time today HL now 0.14 lower than goal tonight - but given heparin dip off for a time today and bleeding  - will make no changes tonight and reassess in am    note, patient is now having heparin levels drawn through a foot stick, due to limited access (ok'd with Dr. Wynelle Cleveland).  Goal of Therapy:  Heparin level 0.3-0.7 units/ml Monitor platelets by anticoagulation protocol: Yes   Plan:  Continue  heparin infusion to 2350 units/hr Heparin level, CBC with AM labs and daily Monitor for signs/symptoms of bleeding   Bonnita Nasuti Pharm.D. CPP, BCPS Clinical Pharmacist [phone] 02/12/2019 10:24  PM    

## 2019-02-12 NOTE — Progress Notes (Signed)
Patient returned from hemodialysis.

## 2019-02-13 ENCOUNTER — Other Ambulatory Visit (HOSPITAL_COMMUNITY): Payer: Medicare Other

## 2019-02-13 ENCOUNTER — Inpatient Hospital Stay (HOSPITAL_COMMUNITY): Payer: Medicare Other

## 2019-02-13 DIAGNOSIS — R0602 Shortness of breath: Secondary | ICD-10-CM

## 2019-02-13 LAB — RENAL FUNCTION PANEL
Albumin: 2.7 g/dL — ABNORMAL LOW (ref 3.5–5.0)
Anion gap: 18 — ABNORMAL HIGH (ref 5–15)
BUN: 47 mg/dL — ABNORMAL HIGH (ref 8–23)
CO2: 22 mmol/L (ref 22–32)
Calcium: 9 mg/dL (ref 8.9–10.3)
Chloride: 94 mmol/L — ABNORMAL LOW (ref 98–111)
Creatinine, Ser: 7.94 mg/dL — ABNORMAL HIGH (ref 0.61–1.24)
GFR calc Af Amer: 7 mL/min — ABNORMAL LOW (ref >60)
GFR calc non Af Amer: 6 mL/min — ABNORMAL LOW (ref >60)
Glucose, Bld: 118 mg/dL — ABNORMAL HIGH (ref 70–99)
Phosphorus: 3 mg/dL (ref 2.5–4.6)
Potassium: 3.7 mmol/L (ref 3.5–5.1)
Sodium: 134 mmol/L — ABNORMAL LOW (ref 135–145)

## 2019-02-13 LAB — CBC
HCT: 29.9 % — ABNORMAL LOW (ref 39.0–52.0)
Hemoglobin: 9.6 g/dL — ABNORMAL LOW (ref 13.0–17.0)
MCH: 30.8 pg (ref 26.0–34.0)
MCHC: 32.1 g/dL (ref 30.0–36.0)
MCV: 95.8 fL (ref 80.0–100.0)
Platelets: 217 10*3/uL (ref 150–400)
RBC: 3.12 MIL/uL — ABNORMAL LOW (ref 4.22–5.81)
RDW: 19.8 % — ABNORMAL HIGH (ref 11.5–15.5)
WBC: 11.8 10*3/uL — ABNORMAL HIGH (ref 4.0–10.5)
nRBC: 0 % (ref 0.0–0.2)

## 2019-02-13 LAB — GLUCOSE, CAPILLARY
Glucose-Capillary: 143 mg/dL — ABNORMAL HIGH (ref 70–99)
Glucose-Capillary: 156 mg/dL — ABNORMAL HIGH (ref 70–99)
Glucose-Capillary: 84 mg/dL (ref 70–99)
Glucose-Capillary: 141 mg/dL — ABNORMAL HIGH (ref 70–99)

## 2019-02-13 LAB — HEPARIN LEVEL (UNFRACTIONATED)
Heparin Unfractionated: 0.24 IU/mL — ABNORMAL LOW (ref 0.30–0.70)
Heparin Unfractionated: 0.43 IU/mL (ref 0.30–0.70)

## 2019-02-13 MED ORDER — VANCOMYCIN HCL IN DEXTROSE 1-5 GM/200ML-% IV SOLN
1000.0000 mg | INTRAVENOUS | Status: AC
  Filled 2019-02-13: qty 200

## 2019-02-13 MED ORDER — ALBUMIN HUMAN 25 % IV SOLN
25.0000 g | Freq: Once | INTRAVENOUS | Status: AC
  Administered 2019-02-13: 14:00:00 25 g via INTRAVENOUS
  Filled 2019-02-13: qty 100

## 2019-02-13 MED ORDER — HEPARIN (PORCINE) 25000 UT/250ML-% IV SOLN
2400.0000 [IU]/h | INTRAVENOUS | Status: DC
  Administered 2019-02-13 – 2019-02-14 (×2): 2400 [IU]/h via INTRAVENOUS
  Filled 2019-02-13 (×3): qty 250

## 2019-02-13 MED ORDER — ALBUMIN HUMAN 25 % IV SOLN
INTRAVENOUS | Status: AC
  Administered 2019-02-13: 25 g via INTRAVENOUS
  Filled 2019-02-13: qty 100

## 2019-02-13 MED ORDER — CHLORHEXIDINE GLUCONATE CLOTH 2 % EX PADS
6.0000 | MEDICATED_PAD | Freq: Every day | CUTANEOUS | Status: DC
  Administered 2019-02-14 – 2019-02-16 (×3): 6 via TOPICAL

## 2019-02-13 MED ORDER — VANCOMYCIN HCL IN DEXTROSE 1-5 GM/200ML-% IV SOLN
INTRAVENOUS | Status: AC
  Filled 2019-02-13: qty 200

## 2019-02-13 NOTE — Progress Notes (Signed)
[place] for Heparin Indication: atrial fibrillation  Allergies  Allergen Reactions  . Neurontin [Gabapentin] Other (See Comments)    Encephalopathy with myoclonic jerking  . Ace Inhibitors Other (See Comments)    Worsening renal insufficiency    Patient Measurements: Height: 6\' 3"  (190.5 cm) Weight: 271 lb 2.7 oz (123 kg) IBW/kg (Calculated) : 84.5 Heparin Dosing Weight: 111.5   Vital Signs: Temp: 97.7 F (36.5 C) (07/29 0538) Temp Source: Oral (07/29 0538) BP: 99/60 (07/29 0547) Pulse Rate: 51 (07/29 0547)  Labs: Recent Labs    02/11/19 0325  02/12/19 0257 02/12/19 0924 02/12/19 2131 02/13/19 0233 02/13/19 1139  HGB 9.3*  --  9.6*  --   --  9.6*  --   HCT 28.5*  --  29.5*  --   --  29.9*  --   PLT 177  --  193  --   --  217  --   LABPROT  --   --   --  18.8*  --   --   --   INR  --   --   --  1.6*  --   --   --   HEPARINUNFRC >2.20*   < > 0.27*  --  0.14* 0.24* 0.43  CREATININE 9.65*  --  11.17*  --   --  7.94*  --    < > = values in this interval not displayed.    Estimated Creatinine Clearance: 11.4 mL/min (A) (by C-G formula based on SCr of 7.94 mg/dL (H)).  Assessment: Pt is a 78 y/o male with PMH of ESRD on HD TuThSat, HTN, CHF, A-fib (on warfarin PTA), recurrent zoster, recurrent falls x3 in last week, RLE cellulitis, and recent diagnosis of new seizures on Keppra who was discharged from a skilled facility last week. Pt presented to [facility] on 07/25 after having a fall while going to the bathroom. Pt was down for approx. 5 hours before he was found down. Pt denies hitting his head or losing consciousness. He denies having any tremors, seizure like activity, or recent changes in medications. Pt was on warfarin at home PTA with an admit INR of 1.5. Pharmacy has been consulted to dose heparin while warfarin is on hold pending possible LP.   Note, patient is now having heparin levels drawn through a foot stick, due to limited  access (ok'd with Dr. Wynelle Cleveland).  Heparin level (0.43) is therapeutic after increasing drip rate to 2400 units/hr. CBC this AM was stable. Dr. Royce Macadamia reports bleeding at HD cath site again.  Goal of Therapy:  Heparin level 0.3-0.7 units/ml Monitor platelets by anticoagulation protocol: Yes   Plan:  -Dr. Royce Macadamia would like to hold IV heparin until consults primary team  Richardine Service, PharmD PGY1 Pharmacy Resident Phone: [phone] 02/13/2019  1:07 PM  Please check [email or web address] for unit-specific pharmacy phone numbers.

## 2019-02-13 NOTE — Progress Notes (Addendum)
Patient returned to unit from x-ray.

## 2019-02-13 NOTE — TOC Initial Note (Signed)
Transition of Care [facility]) - Initial/Assessment Note    Patient Details  Name: Bruce Cole MRN: [id number] Date of Birth: Jan 18, 1941  Transition of Care [facility]) CM/SW Contact:    Bruce Cole, [place] Phone Number: 02/13/2019, 11:34 AM  Clinical Narrative:                 CSW spoke with pt on phone; introduced self, role, reason for call. CSW confirmed that pt is from home in [place], he lives by himself but has a son that comes and checks up on him. He would like for his daughter Bruce to be contacted for any disposition planning.  CSW spoke with [facility] at [phone]. CSW introduced self, role, reason for call. She confirmed pt from home with the help of a private aide and some therapies and that her brother comes by to check in on pt; Bruce is in [place] and feels pt could benefit from SNF. He recently went to [facility] and per note review they are not keen on return to either [facility] or [facility]. Bruce knows if pt recommended for SNF again that his options would be limited by his dialysis.   CSW will request PT/OT evaluations. CSW will call pt daughter again once those complete. Pt will have likely used some of his Medicare days, he does have a supplemental plan and pt daughter aware that SNF will have to run insurance to better understand if pt does or does not have a copay.   Paged MD for therapy evaluations.  Expected Discharge Plan: Skilled Nursing Facility Barriers to Discharge: Ship broker, Continued Medical Work up, SNF Pending bed offer   Patient Goals and CMS Choice Patient states their goals for this hospitalization and ongoing recovery are:: for him to have more supervision/care- pt daughter CMS Medicare.gov Compare Post Acute Care list provided to:: Patient Represenative (must comment)(pt daughter [facility]) Choice offered to / list presented to : Adult Children  Expected Discharge Plan and Services Expected Discharge Plan: [place] In-house Referral: Clinical Social Work Discharge Planning Services: CM Consult Post Acute Care Choice: [place] arrangements for the past 2 months: [place]                                      Prior Living Arrangements/Services Living arrangements for the past 2 months: Single Family Home Lives with:: Self Patient language and need for interpreter reviewed:: Yes(no needs) Do you feel safe going back to the place where you live?: Yes      Need for Family Participation in Patient Care: Yes (Comment)(assistance with ADL/IADLs; supervision) Care giver support system in place?: Yes (comment)(adult children; [place]; private aide) Current home services: Homehealth aide, Home OT, Home PT Criminal Activity/Legal Involvement Pertinent to Current Situation/Hospitalization: No - Comment as needed  Activities of Daily Living      Permission Sought/Granted Permission sought to share information with : Facility Sport and exercise psychologist, Family Supports Permission granted to share information with : Yes, Verbal Permission Granted  Share Information with NAME: Bruce Cole  Permission granted to share info w AGENCY: SNFs  Permission granted to share info w Relationship: daughter  Permission granted to share info w Contact Information: [phone]  Emotional Assessment Appearance:: Other (Comment Required(spoke with pt on phone; completed assessment telephonically with daughter) Attitude/Demeanor/Rapport: Engaged Affect (typically observed): Adaptable, Accepting Orientation: : Oriented to Place, Oriented to Self, Oriented to  Situation, Oriented to  Time Alcohol / Substance Use: Not Applicable Psych Involvement: No (comment)  Admission diagnosis:  Cellulitis of right lower extremity [L03.115] Fall in home, initial encounter [W19.XXXA, M03.491] Open wound of left lower extremity, initial encounter [S81.802A] Patient Active Problem List    Diagnosis Date Noted  . Open wound of left lower extremity   . Varicose veins of left lower extremity with inflammation, with ulcer of calf limited to breakdown of skin ([place])   . Moderate protein-calorie malnutrition ([place])   . Cellulitis 01/19/2019  . Seizure ([place]) 12/09/2018  . Herpes 12/09/2018  . High risk medication use 03/17/2017  . Hyperkalemia 08/01/2016  . Aftercare following surgery of the circulatory system 06/17/2016  . Status post total replacement of right hip 03/22/2016  . ESRD on dialysis ([place]) 11/27/2015  . OSA (obstructive sleep apnea) 09/02/2013  . Chronic combined systolic and diastolic heart failure ([place]) 03/16/2013  . Allergic rhinitis 12/18/2012  . Long term current use of anticoagulant therapy 11/25/2010  . Genital herpes 05/31/2010  . DM (diabetes mellitus), type 2 with renal complications ([place]) 79/15/0569  . ERECTILE DYSFUNCTION, ORGANIC 09/21/2009  . Osteoarthritis 09/21/2009  . PERSONAL HX COLONIC POLYPS 08/25/2009  . Gout 07/22/2009  . Essential hypertension 07/22/2009  . ATRIAL FIBRILLATION 07/22/2009  . Rheumatoid arthritis ([place]) 07/22/2009  . NEPHROLITHIASIS, HX OF 07/22/2009   PCP:  Bruce Alar, NP Pharmacy:   [facility] [phone] [address], [address] AT [facility] OF [place] [address] Phone: [phone] Fax: [phone]     Social Determinants of Health ([place]) Interventions    Readmission Risk Interventions No flowsheet data found.

## 2019-02-13 NOTE — Progress Notes (Signed)
Patient off unit for xray

## 2019-02-13 NOTE — Progress Notes (Signed)
PROGRESS NOTE    Bruce Cole   QVZ:[id number]  DOB: 1941-05-17  DOA: 01/26/2019 PCP: Debbrah Alar, NP   Brief Narrative:  Bruce Cole is a 78 y/o male with ESRD, HTN, combined CHF, A-fib on Coumadin, DM2, RH Arthritis, OSA seizures who presents after a fall at home.  In ED - temp 99, WBC 15, right leg and thigh cellultis and necrosis. Started on Vanc and Zosyn.  02/13/2019: Patient seen.  Patient has continued to complain of shortness of breath since last night despite having had hemodialysis yesterday.  Chest x-ray ordered, reviewed, but official reading is pending.  Will order limited echo to rule out pericardial effusion.  Further management will depend on above.   Subjective: Shortness of breath since last night.      Assessment & Plan:   Principal Problem:   Cellulitis with necrosis, low grade fever, leukocytosis and mild lactic acidosis - extending from lower left leg around a prior injury which is not healing up to the thigh  - necrotic tissue noted on both legs along with foul smell - failed outpt antibiotics - continue IV antibiotics- Vanc/Zosyn - Dr Sharol Given has evaluated him- no recommendations for surgery as of yet - ABI pending -  MRI > Extensive subcutaneous edema and mild intramuscular edema of the left lower extremity suggesting a nonspecific cellulitis and myositis.Similar findings are identified within the visualized portion of the contralateral right lower extremity. Left Achilles tendinosis without tear. 02/13/2019: Orthopedic input is appreciated.  Left lower extremity is wrapped.  Continue antibiotics for now.  A-fib on Coumadin CHA2DS2-VASc Score at least 5 - INR currently is subtherapeutic at 1.5- f/u on results today - bridging with Heparin- pharmacy managing both Heparin and Coumadin - currently rate controlled - Addendum: INR 1.6- Heparin held for now as he had extensive bleeding from fistula after dialysis- f/u on Heparin level - will  eventually resume Heparin today as long as he is no longer bleeding 02/13/2019: Patient is on heparin.  Continue to monitor closely.   DM (diabetes mellitus), type 2 with renal complications  - on Victoza, Levemir as outpt - Victoza and SSI ordered--   follow for need to add long acting insulin 02/13/2019: Continue to optimize.  Blood sugar ranges from 1 43 to 156.     Chronic combined systolic and diastolic heart failure     ESRD on dialysis on MWF - fluid management with dialysis- Nephrology following and he was being dialyzed this AM 02/13/2019: Follow repeat limited echo.  Rule out pericardial effusion.  If no pericardial effusion, and patient continues to be short of breath, consider discussing about more frequently hemodialysis.  Patient's blood pressure may be a limiting factor.  Anemia of chronic kidney disease: - receiving Iron and Darbepoetin with dialysis  Hypotension - cont Midodrine 02/13/2019: Likely related to patient's heart failure.  Could denote poor prognosis.  Hypokalemia - to be replaced with dialysis 02/13/2019: Potassium is 3.7 today.  Continue to optimize to his hemodialysis.  Rheumatoid arthritis - cont Plaquenil   Gout - uses PRN Colchicine    Seizure disorder - cont Keppra  Time spent in minutes: 35 DVT prophylaxis: Heparin infusion Code Status: DNR Family Communication:  Disposition Plan: cont to treat infection Consultants:   ortho Procedures:   none Antimicrobials:  Anti-infectives (From admission, onward)   Start     Dose/Rate Route Frequency Ordered Stop   02/12/19 1200  vancomycin (VANCOCIN) IVPB 1000 mg/200 mL premix  1,000 mg 200 mL/hr over 60 Minutes Intravenous Every T-Th-Sa (Hemodialysis) 02/12/19 0810     02/11/19 1200  vancomycin (VANCOCIN) IVPB 1000 mg/200 mL premix  Status:  Discontinued     1,000 mg 200 mL/hr over 60 Minutes Intravenous Every M-W-F (Hemodialysis) 02/14/2019 1844 02/12/19 0810   02/11/19 1030   hydroxychloroquine (PLAQUENIL) tablet 200 mg     200 mg Oral Daily 02/11/19 1021     02/11/19 0800  piperacillin-tazobactam (ZOSYN) IVPB 3.375 g     3.375 g 12.5 mL/hr over 240 Minutes Intravenous Every 12 hours 01/23/2019 1844     01/20/2019 1800  piperacillin-tazobactam (ZOSYN) IVPB 3.375 g     3.375 g 100 mL/hr over 30 Minutes Intravenous  Once 02/11/2019 1735 02/14/2019 1913   01/25/2019 1800  vancomycin (VANCOCIN) 2,000 mg in sodium chloride 0.9 % 500 mL IVPB     2,000 mg 250 mL/hr over 120 Minutes Intravenous  Once 01/30/2019 1735 01/25/2019 2042   01/29/2019 1130  ceFAZolin (ANCEF) IVPB 1 g/50 mL premix     1 g 100 mL/hr over 30 Minutes Intravenous  Once 02/13/2019 1128 01/31/2019 1222      Objective: Vitals:   02/12/19 1359 02/12/19 2147 02/13/19 0538 02/13/19 0547  BP: (!) 118/58 97/61 (!) 81/65 99/60  Pulse: 80 (!) 52 78 (!) 51  Resp: 20 16 20    Temp:  97.8 F (36.6 C) 97.7 F (36.5 C)   TempSrc:  Oral Oral   SpO2:  100%    Weight:      Height:        Intake/Output Summary (Last 24 hours) at 02/13/2019 1236 Last data filed at 02/13/2019 0830 Gross per 24 hour  Intake 444 ml  Output -  Net 444 ml   Filed Weights   01/24/2019 1633 02/12/19 0720 02/12/19 1100  Weight: 125.1 kg 126 kg 123 kg    Examination:  General exam:  Morbidly obese.  Not in any significant distress.   HEENT: PERRLA, oral mucosa moist, no sclera icterus or thrush Respiratory system: Decreased air entry globally.   Cardiovascular system: S1 & S2. Gastrointestinal system: Abdomen is morbidly obese, non-tender. Normal bowel sounds   Central nervous system: Alert and oriented. No focal neurological deficits. Extremities: Lower extremities wrapped.  Edema of the right lower extremity.    Data Reviewed: I have personally reviewed following labs and imaging studies  CBC: Recent Labs  Lab 02/01/2019 1000 02/11/19 0325 02/12/19 0257 02/13/19 0233  WBC 15.7* 13.5* 13.4* 11.8*  NEUTROABS 13.6*  --   --   --    HGB 10.0* 9.3* 9.6* 9.6*  HCT 31.5* 28.5* 29.5* 29.9*  MCV 96.9 94.4 92.8 95.8  PLT 180 177 193 132   Basic Metabolic Panel: Recent Labs  Lab 02/05/2019 1000 02/11/19 0325 02/12/19 0257 02/13/19 0233  NA 133* 131* 130* 134*  K 3.6 3.3* 3.7 3.7  CL 89* 91* 90* 94*  CO2 25 26 21* 22  GLUCOSE 132* 110* 126* 118*  BUN 67* 71* 88* 47*  CREATININE 8.78* 9.65* 11.17* 7.94*  CALCIUM 9.4 8.7* 8.9 9.0  PHOS  --   --  4.0 3.0   GFR: Estimated Creatinine Clearance: 11.4 mL/min (A) (by C-G formula based on SCr of 7.94 mg/dL (H)). Liver Function Tests: Recent Labs  Lab 01/16/2019 1000 02/12/19 0257 02/13/19 0233  AST 30  --   --   ALT 18  --   --   ALKPHOS 269*  --   --  BILITOT 1.2  --   --   PROT 7.9  --   --   ALBUMIN 3.3* 2.7* 2.7*   No results for input(s): LIPASE, AMYLASE in the last 168 hours. No results for input(s): AMMONIA in the last 168 hours. Coagulation Profile: Recent Labs  Lab 01/27/2019 1046 02/12/19 0924  INR 1.5* 1.6*   Cardiac Enzymes: Recent Labs  Lab 02/01/2019 1000  CKTOTAL 198   BNP (last 3 results) No results for input(s): PROBNP in the last 8760 hours. HbA1C: No results for input(s): HGBA1C in the last 72 hours. CBG: Recent Labs  Lab 02/12/19 1423 02/12/19 1656 02/12/19 2149 02/13/19 0751 02/13/19 1219  GLUCAP 77 130* 112* 143* 156*   Lipid Profile: No results for input(s): CHOL, HDL, LDLCALC, TRIG, CHOLHDL, LDLDIRECT in the last 72 hours. Thyroid Function Tests: No results for input(s): TSH, T4TOTAL, FREET4, T3FREE, THYROIDAB in the last 72 hours. Anemia Panel: No results for input(s): VITAMINB12, FOLATE, FERRITIN, TIBC, IRON, RETICCTPCT in the last 72 hours. Urine analysis:    Component Value Date/Time   COLORURINE YELLOW 11/10/2015 1800   APPEARANCEUR CLEAR 11/10/2015 1800   LABSPEC 1.012 11/10/2015 1800   PHURINE 5.5 11/10/2015 1800   GLUCOSEU NEGATIVE 11/10/2015 1800   GLUCOSEU NEGATIVE 05/05/2015 1135   HGBUR NEGATIVE  11/10/2015 1800   HGBUR negative 09/21/2009 1008   BILIRUBINUR NEGATIVE 11/10/2015 1800   KETONESUR NEGATIVE 11/10/2015 1800   PROTEINUR NEGATIVE 11/10/2015 1800   UROBILINOGEN 0.2 05/05/2015 1135   NITRITE NEGATIVE 11/10/2015 1800   LEUKOCYTESUR NEGATIVE 11/10/2015 1800   Sepsis Labs: @LABRCNTIP (procalcitonin:4,lacticidven:4) ) Recent Results (from the past 240 hour(s))  Blood Culture (routine x 2)     Status: None (Preliminary result)   Collection Time: 02/01/2019  9:30 AM   Specimen: BLOOD RIGHT ARM  Result Value Ref Range Status   Specimen Description   Final    BLOOD RIGHT ARM Performed at [facility], [address] [place], [place] [address]    Special Requests   Final    BOTTLES DRAWN AEROBIC AND ANAEROBIC Blood Culture adequate volume Performed at [facility], [address] [place], [place] [address]    Culture   Final    NO GROWTH 3 DAYS Performed at [facility], [place] [address], [place], [place] [address]    Report Status PENDING  Incomplete  Blood Culture (routine x 2)     Status: None (Preliminary result)   Collection Time: 01/18/2019  9:50 AM   Specimen: BLOOD RIGHT HAND  Result Value Ref Range Status   Specimen Description   Final    BLOOD RIGHT HAND Performed at [facility], [address] [place], [place] [address]    Special Requests   Final    BOTTLES DRAWN AEROBIC AND ANAEROBIC Blood Culture results may not be optimal due to an inadequate volume of blood received in culture bottles Performed at [facility], [address] [place], [place] [address]    Culture   Final    NO GROWTH 3 DAYS Performed at [facility], [place] [address], [place], [place] [address]    Report Status PENDING  Incomplete  SARS Coronavirus 2 (Performed in [place] hospital lab)     Status: None   Collection Time: 02/07/2019 12:29 PM   Specimen: Nasopharyngeal Swab  Result Value Ref Range Status   SARS Coronavirus 2  NEGATIVE NEGATIVE Final  Comment: (NOTE) If result is NEGATIVE SARS-CoV-2 target nucleic acids are NOT DETECTED. The SARS-CoV-2 RNA is generally detectable in upper and lower  respiratory specimens during the acute phase of infection. The lowest  concentration of SARS-CoV-2 viral copies this assay can detect is 250  copies / mL. A negative result does not preclude SARS-CoV-2 infection  and should not be used as the sole basis for treatment or other  patient management decisions.  A negative result may occur with  improper specimen collection / handling, submission of specimen other  than nasopharyngeal swab, presence of viral mutation(s) within the  areas targeted by this assay, and inadequate number of viral copies  (<250 copies / mL). A negative result must be combined with clinical  observations, patient history, and epidemiological information. If result is POSITIVE SARS-CoV-2 target nucleic acids are DETECTED. The SARS-CoV-2 RNA is generally detectable in upper and lower  respiratory specimens dur ing the acute phase of infection.  Positive  results are indicative of active infection with SARS-CoV-2.  Clinical  correlation with patient history and other diagnostic information is  necessary to determine patient infection status.  Positive results do  not rule out bacterial infection or co-infection with other viruses. If result is PRESUMPTIVE POSTIVE SARS-CoV-2 nucleic acids MAY BE PRESENT.   A presumptive positive result was obtained on the submitted specimen  and confirmed on repeat testing.  While 2019 novel coronavirus  (SARS-CoV-2) nucleic acids may be present in the submitted sample  additional confirmatory testing may be necessary for epidemiological  and / or clinical management purposes  to differentiate between  SARS-CoV-2 and other Sarbecovirus currently known to infect humans.  If clinically indicated additional testing with an alternate test  methodology [phone])  is advised. The SARS-CoV-2 RNA is generally  detectable in upper and lower respiratory sp ecimens during the acute  phase of infection. The expected result is Negative. Fact Sheet for Patients:  StrictlyIdeas.no Fact Sheet for Healthcare Providers: BankingDealers.co.za This test is not yet approved or cleared by the [place] FDA and has been authorized for detection and/or diagnosis of SARS-CoV-2 by FDA under an Emergency Use Authorization (EUA).  This EUA will remain in effect (meaning this test can be used) for the duration of the COVID-19 declaration under Section 564(b)(1) of the Act, 21 U.S.C. section 360bbb-3(b)(1), unless the authorization is terminated or revoked sooner. Performed at [facility], [address] [place], [place] [address]          Radiology Studies: Mr Tibia Fibula Left Wo Contrast  Result Date: 02/11/2019 CLINICAL DATA:  Left lower extremity redness, swelling EXAM: MRI OF LOWER LEFT EXTREMITY WITHOUT CONTRAST TECHNIQUE: Multiplanar, multisequence MR imaging of the left lower extremity from the proximal tibial metaphysis to the hindfoot was performed. No intravenous contrast was administered. COMPARISON:  None. FINDINGS: Bones/Joint/Cartilage No acute fracture. No malalignment. No abnormal cortical signal or rotation. No bone marrow edema. Mild degenerative changes of the tibiotalar and subtalar joints. No focal talar dome osteochondral lesion. Ligaments Grossly intact, suboptimally evaluated. Muscles and Tendons There is extensive atrophy and fatty infiltration throughout the lower leg musculature. Mild edema-like intramuscular signal most pronounced within the flexor hallucis longus and soleus muscles. No intramuscular fluid collections. Mild thickening and intermediate signal within the distal left Achilles tendon without tear. Remaining ankle tendons appear grossly intact. No significant  tenosynovial fluid. Soft tissues Near circumferential subcutaneous edema, most pronounced over the anteromedial aspect of the distal tibial metadiaphysis. No well-defined  or drainable fluid collections. No significant deep fascial edema. The included portions of the contralateral right lower extremity reveals similar findings. IMPRESSION: 1. Extensive subcutaneous edema and mild intramuscular edema of the left lower extremity suggesting a nonspecific cellulitis and myositis. No well-defined or drainable fluid collections. No significant deep fascial edema. 2. No marrow signal abnormality to suggest acute osteomyelitis. 3. Similar findings are identified within the visualized portion of the contralateral right lower extremity. 4. Left Achilles tendinosis without tear. Electronically Signed   By: Davina Poke M.D.   On: 02/11/2019 16:54   Vas [place] Burnard Bunting With/wo Tbi  Result Date: 02/12/2019 LOWER EXTREMITY DOPPLER STUDY Indications: Gangrene. High Risk Factors: Hypertension, Diabetes.  Limitations: patient movement Comparison Study: No prior studies. Performing Technologist: Carlos Levering Rvt  Examination Guidelines: A complete evaluation includes at minimum, Doppler waveform signals and systolic blood pressure reading at the level of bilateral brachial, anterior tibial, and posterior tibial arteries, when vessel segments are accessible. Bilateral testing is considered an integral part of a complete examination. Photoelectric Plethysmograph (PPG) waveforms and toe systolic pressure readings are included as required and additional duplex testing as needed. Limited examinations for reoccurring indications may be performed as noted.  ABI Findings: +--------+------------------+-----+---------+--------+ Right   Rt Pressure (mmHg)IndexWaveform Comment  +--------+------------------+-----+---------+--------+ Brachial102                    triphasic         +--------+------------------+-----+---------+--------+  PTA     102               1.00 biphasic          +--------+------------------+-----+---------+--------+ DP      143               1.40 biphasic          +--------+------------------+-----+---------+--------+ +---------+------------------+-----+--------+-------+ Left     Lt Pressure (mmHg)IndexWaveformComment +---------+------------------+-----+--------+-------+ PTA      211               2.07 biphasic        +---------+------------------+-----+--------+-------+ DP       244               2.39 biphasic        +---------+------------------+-----+--------+-------+ Great Toe53                0.52                 +---------+------------------+-----+--------+-------+ +-------+-----------+-----------+------------+------------+ ABI/TBIToday's ABIToday's TBIPrevious ABIPrevious TBI +-------+-----------+-----------+------------+------------+ Right  1.4                                            +-------+-----------+-----------+------------+------------+ Left   2.39       0.52                                +-------+-----------+-----------+------------+------------+  Summary: Right: Resting right ankle-brachial index indicates noncompressible right lower extremity arteries. Unable to obtain TBI due to low amplitude waveforms. Left: Resting left ankle-brachial index indicates noncompressible left lower extremity arteries.The left toe-brachial index is abnormal.  *See table(s) above for measurements and observations.  Electronically signed by Deitra Mayo MD on 02/12/2019 at 5:52:38 PM.   Final       Scheduled Meds: . atorvastatin  10 mg Oral Daily  . Chlorhexidine Gluconate  Cloth  6 each Topical [id number]  . darbepoetin (ARANESP) injection - DIALYSIS  60 mcg Intravenous Q Tue-HD  . feeding supplement (PRO-STAT SUGAR FREE 64)  30 mL Oral BID  . ferric citrate  420 mg Oral TID WC  . FLUoxetine  20 mg Oral Daily  . hydroxychloroquine  200 mg Oral Daily  . insulin  aspart  0-15 Units Subcutaneous TID WC  . levETIRAcetam  250 mg Oral Q T,Th,Sat-1800  . liraglutide  0.6 mg Subcutaneous Daily  . midodrine  10 mg Oral Q M,W,F-HD  . midodrine  5 mg Oral BID WC  . multivitamin  1 tablet Oral QHS  . pantoprazole  40 mg Oral Daily  . sevelamer carbonate  2,400 mg Oral TID WC  . ticagrelor  90 mg Oral BID  . vitamin B-12  1,000 mcg Oral Daily   Continuous Infusions: . heparin 2,400 Units/hr (02/13/19 0732)  . piperacillin-tazobactam (ZOSYN)  IV 3.375 g (02/13/19 1110)  . vancomycin Stopped (02/12/19 1150)     LOS: 3 days      Bonnell Public, MD [facility] Pager: [email or web address] Password [facility] 02/13/2019, 12:36 PM

## 2019-02-13 NOTE — Progress Notes (Signed)
[facility] Progress Note   Subjective:   Patient seen in room. Reporting worsening SOB today with orthopnea and edema. He did have dialysis yesterday with UF 3155 ml. Chest x-ray ordered by primary and official read is pending. Pericardial effusion has been ordered. He denies CP, palpitations, abdominal pain, N/V/D. Still c/o leg pain. He also had prolonged bleeding from AVF after HD yesterday and reports this has been happening for about 1 month.   Objective Vitals:   02/12/19 1359 02/12/19 2147 02/13/19 0538 02/13/19 0547  BP: (!) 118/58 97/61 (!) 81/65 99/60  Pulse: 80 (!) 52 78 (!) 51  Resp: 20 16 20    Temp:  97.8 F (36.6 C) 97.7 F (36.5 C)   TempSrc:  Oral Oral   SpO2:  100%    Weight:      Height:       Physical Exam General: Well developed male, alert and in NAD Heart: RRR, no murmurs, rubs or gallops Lungs: CTA bilaterally without wheezing, rhonchi or rales Abdomen: Soft, non-tender. +BS Extremities:2+ edema bilateral lower extremities. LLE with extensive wound on posterior leg Dialysis Access: RUE AVF   Additional Objective Labs: Basic Metabolic Panel: Recent Labs  Lab 02/11/19 0325 02/12/19 0257 02/13/19 0233  NA 131* 130* 134*  K 3.3* 3.7 3.7  CL 91* 90* 94*  CO2 26 21* 22  GLUCOSE 110* 126* 118*  BUN 71* 88* 47*  CREATININE 9.65* 11.17* 7.94*  CALCIUM 8.7* 8.9 9.0  PHOS  --  4.0 3.0   Liver Function Tests: Recent Labs  Lab 02/02/2019 1000 02/12/19 0257 02/13/19 0233  AST 30  --   --   ALT 18  --   --   ALKPHOS 269*  --   --   BILITOT 1.2  --   --   PROT 7.9  --   --   ALBUMIN 3.3* 2.7* 2.7*   No results for input(s): LIPASE, AMYLASE in the last 168 hours. CBC: Recent Labs  Lab 02/06/2019 1000 02/11/19 0325 02/12/19 0257 02/13/19 0233  WBC 15.7* 13.5* 13.4* 11.8*  NEUTROABS 13.6*  --   --   --   HGB 10.0* 9.3* 9.6* 9.6*  HCT 31.5* 28.5* 29.5* 29.9*  MCV 96.9 94.4 92.8 95.8  PLT 180 177 193 217   Blood Culture     Component Value Date/Time   SDES  02/01/2019 0950    BLOOD RIGHT HAND Performed at [facility], [address], [place], [place] [address]    [facility]  02/04/2019 0950    BOTTLES DRAWN AEROBIC AND ANAEROBIC Blood Culture results may not be optimal due to an inadequate volume of blood received in culture bottles Performed at [facility], [address], [place], [place] [address]    CULT  02/15/2019 0950    NO GROWTH 3 DAYS Performed at [facility], [place] [address] [place], [place] [address]    REPTSTATUS PENDING 02/09/2019 1937    Cardiac Enzymes: Recent Labs  Lab 01/20/2019 1000  CKTOTAL 198   CBG: Recent Labs  Lab 02/12/19 1423 02/12/19 1656 02/12/19 2149 02/13/19 0751 02/13/19 1219  GLUCAP 77 130* 112* 143* 156*    Studies/Results: Mr Tibia Fibula Left Wo Contrast  Result Date: 02/11/2019 CLINICAL DATA:  Left lower extremity redness, swelling EXAM: MRI OF LOWER LEFT EXTREMITY WITHOUT CONTRAST TECHNIQUE: Multiplanar, multisequence MR imaging of the left lower extremity from the proximal tibial metaphysis to the hindfoot was performed.  No intravenous contrast was administered. COMPARISON:  None. FINDINGS: Bones/Joint/Cartilage No acute fracture. No malalignment. No abnormal cortical signal or rotation. No bone marrow edema. Mild degenerative changes of the tibiotalar and subtalar joints. No focal talar dome osteochondral lesion. Ligaments Grossly intact, suboptimally evaluated. Muscles and Tendons There is extensive atrophy and fatty infiltration throughout the lower leg musculature. Mild edema-like intramuscular signal most pronounced within the flexor hallucis longus and soleus muscles. No intramuscular fluid collections. Mild thickening and intermediate signal within the distal left Achilles tendon without tear. Remaining ankle tendons appear grossly intact. No significant tenosynovial fluid. Soft tissues Near circumferential subcutaneous  edema, most pronounced over the anteromedial aspect of the distal tibial metadiaphysis. No well-defined or drainable fluid collections. No significant deep fascial edema. The included portions of the contralateral right lower extremity reveals similar findings. IMPRESSION: 1. Extensive subcutaneous edema and mild intramuscular edema of the left lower extremity suggesting a nonspecific cellulitis and myositis. No well-defined or drainable fluid collections. No significant deep fascial edema. 2. No marrow signal abnormality to suggest acute osteomyelitis. 3. Similar findings are identified within the visualized portion of the contralateral right lower extremity. 4. Left Achilles tendinosis without tear. Electronically Signed   By: Davina Poke M.D.   On: 02/11/2019 16:54   Vas [place] Burnard Bunting With/wo Tbi  Result Date: 02/12/2019 LOWER EXTREMITY DOPPLER STUDY Indications: Gangrene. High Risk Factors: Hypertension, Diabetes.  Limitations: patient movement Comparison Study: No prior studies. Performing Technologist: Carlos Levering Rvt  Examination Guidelines: A complete evaluation includes at minimum, Doppler waveform signals and systolic blood pressure reading at the level of bilateral brachial, anterior tibial, and posterior tibial arteries, when vessel segments are accessible. Bilateral testing is considered an integral part of a complete examination. Photoelectric Plethysmograph (PPG) waveforms and toe systolic pressure readings are included as required and additional duplex testing as needed. Limited examinations for reoccurring indications may be performed as noted.  ABI Findings: +--------+------------------+-----+---------+--------+ Right   Rt Pressure (mmHg)IndexWaveform Comment  +--------+------------------+-----+---------+--------+ Brachial102                    triphasic         +--------+------------------+-----+---------+--------+ PTA     102               1.00 biphasic           +--------+------------------+-----+---------+--------+ DP      143               1.40 biphasic          +--------+------------------+-----+---------+--------+ +---------+------------------+-----+--------+-------+ Left     Lt Pressure (mmHg)IndexWaveformComment +---------+------------------+-----+--------+-------+ PTA      211               2.07 biphasic        +---------+------------------+-----+--------+-------+ DP       244               2.39 biphasic        +---------+------------------+-----+--------+-------+ Great Toe53                0.52                 +---------+------------------+-----+--------+-------+ +-------+-----------+-----------+------------+------------+ ABI/TBIToday's ABIToday's TBIPrevious ABIPrevious TBI +-------+-----------+-----------+------------+------------+ Right  1.4                                            +-------+-----------+-----------+------------+------------+ Left  2.39       0.52                                +-------+-----------+-----------+------------+------------+  Summary: Right: Resting right ankle-brachial index indicates noncompressible right lower extremity arteries. Unable to obtain TBI due to low amplitude waveforms. Left: Resting left ankle-brachial index indicates noncompressible left lower extremity arteries.The left toe-brachial index is abnormal.  *See table(s) above for measurements and observations.  Electronically signed by Deitra Mayo MD on 02/12/2019 at 5:52:38 PM.   Final    Medications: . heparin 2,400 Units/hr (02/13/19 0732)  . piperacillin-tazobactam (ZOSYN)  IV 3.375 g (02/13/19 1110)  . vancomycin Stopped (02/12/19 1150)   . atorvastatin  10 mg Oral Daily  . Chlorhexidine Gluconate Cloth  6 each Topical Q0600  . darbepoetin (ARANESP) injection - DIALYSIS  60 mcg Intravenous Q Tue-HD  . feeding supplement (PRO-STAT SUGAR FREE 64)  30 mL Oral BID  . ferric citrate  420 mg Oral TID WC   . FLUoxetine  20 mg Oral Daily  . hydroxychloroquine  200 mg Oral Daily  . insulin aspart  0-15 Units Subcutaneous TID WC  . levETIRAcetam  250 mg Oral Q T,Th,Sat-1800  . liraglutide  0.6 mg Subcutaneous Daily  . midodrine  10 mg Oral Q M,W,F-HD  . midodrine  5 mg Oral BID WC  . multivitamin  1 tablet Oral QHS  . pantoprazole  40 mg Oral Daily  . sevelamer carbonate  2,400 mg Oral TID WC  . ticagrelor  90 mg Oral BID  . vitamin B-12  1,000 mcg Oral Daily    Dialysis Orders: Center:[facility] MWF. 2K/ 2.25 Ca; EDW 123.5kg (last post weight +3.4kg); LUE AVF Heparin 2000 unit bolus Mircera 127mcg IV q 2 weeks  Renvela 800mg  3 tabs PO TID  Assessment/Plan: 1. Cellulitis:Left lower extremity, seen by ortho. MRI completed  without osteomyelitis, plan is for compression per ortho. On vancomycin and zosyn. Per primary/ortho. Also has pressure injury on right buttocks, wound care following.  2. ESRD:Normally dialyzes MWF at [facility], dialyzed yesterday off schedule due to high patient load. Reports SOB with orthopnea today and still has significant edema. Will plan to dialyze today for volume removal as tolerated. Continue TTS schedule for now.  K+ 3.7, on 3K bath.On heparin infusion, hold for now due to bleeding.  3. Bleeding from AVF: Prolonged bleeding after dialysis yesterday. Patient was on heparin drip but also reports this has been happening for approx. 1 month. Will consult IR for fistulogram tomorrow. 4. Hypertension/volume:Blood pressure chronically low. On midodrine. Does appear to be volume overloaded on exam today. Chest x-ray today cardiomegaly with mild perihilar interstitial opacities suggesting CHF/fluid overload. Echo pending. Will plan for HD again today with UFG 3L as tolerated. Will order albumin at start of HD for blood pressure support.  5. Anemia:Hemoglobin 9.6.Continue aranesp 45mcg IV q week with dialysis. 6. Metabolic bone disease:Calcium 9.0.  Last outpatient phos 3.7, PTH 241. Continue renvela. 7. Nutrition:Albumin 3.3. Continue pro-stat supplement.  8. A. Fib on coumadin: INR reportedly suptherapeutic, bridging with heparin but had prolonged bleeding from AVF yesterday. Per primary. 9. T2DM: Currently in SSI, per primary.  10. Seizure disorder:On Keppra  Anice Paganini, PA-C 02/13/2019, 12:57 PM  [facility] Pager: [phone]

## 2019-02-13 NOTE — Progress Notes (Signed)
Patient off unit for dialysis.

## 2019-02-13 NOTE — Progress Notes (Signed)
Patient returned to unit from hemodialysis.

## 2019-02-13 NOTE — Progress Notes (Signed)
Request to IR for fistulagram with possible intervention due to prolonged bleeding after HD x1 month - this is tentatively scheduled for tomorrow (7/30).  Brilinta does not need to held, however IV heparin (if resumed) will need to be held 4 hours prior to procedure. I have placed orders for patient to be NPO after midnight, AM labs.   Full consult/consent will be obtained tomorrow prior to procedure.  Please call IR with any questions or concerns.  Candiss Norse, PA-C

## 2019-02-13 NOTE — Evaluation (Signed)
Physical Therapy Evaluation Patient Details Name: Bruce Cole MRN: [id number] DOB: 1940/11/26 Today's Date: 02/13/2019   History of Present Illness  Pt is a 78 y/o male admitted secondary to fall and seizure. Also found to have LLE wound and cellulitis. PMH includes HTN, a fib, DM, ESRD on HD, gout, and CHF.   Clinical Impression  Pt admitted secondary to problem above with deficits below. Pt with increased pain and numbness in LLE which limited mobility this session. Pt requiring max A +2 to perform basic bed mobility tasks this session. Pt currently lives alone and has help from and aide for only a few hours a day. Feel pt is at increased risk for falls and will require increased assist at d/c. Will continue to follow acutely to maximize functional mobility independence and safety.     Follow Up Recommendations SNF;Supervision/Assistance - 24 hour    Equipment Recommendations  None recommended by PT    Recommendations for Other Services       Precautions / Restrictions Precautions Precautions: Fall Restrictions Weight Bearing Restrictions: No      Mobility  Bed Mobility Overal bed mobility: Needs Assistance Bed Mobility: Rolling Rolling: Max assist;+2 for physical assistance         General bed mobility comments: Pt with increased pain, so session focused on repositioning. Pt requiring max A +2 to roll side to side for pad placement. Pt requiring total A +2 for repositioning and sliding up in bed. Pt grimacing throughout session. BLE propped on pillows at end of session.   Transfers                    Ambulation/Gait                Stairs            Wheelchair Mobility    Modified Rankin (Stroke Patients Only)       Balance                                             Pertinent Vitals/Pain Pain Assessment: Faces Faces Pain Scale: Hurts even more Pain Location: LLE  Pain Descriptors / Indicators:  Grimacing;Guarding Pain Intervention(s): Limited activity within patient's tolerance;Monitored during session;Repositioned    Home Living Family/patient expects to be discharged to:: Private residence Living Arrangements: Alone Available Help at Discharge: Personal care attendant(4 hours a day; 7 days/week) Type of Home: House Home Access: Ramped entrance     Home Layout: Two level;Able to live on main level with bedroom/bathroom Home Equipment: Gilford Rile - 2 wheels      Prior Function Level of Independence: Needs assistance   Gait / Transfers Assistance Needed: Pt reports he was using RW for ambulation, however, had been having more difficulty ambulating  ADL's / Homemaking Assistance Needed: Pt requires assist from aide for bathing/dressing        Hand Dominance        Extremity/Trunk Assessment   Upper Extremity Assessment Upper Extremity Assessment: Defer to OT evaluation    Lower Extremity Assessment Lower Extremity Assessment: Generalized weakness;LLE deficits/detail LLE Deficits / Details: LLE wrapped this session. Pt reporting some numbness and increased pain.        Communication   Communication: No difficulties  Cognition Arousal/Alertness: Awake/alert Behavior During Therapy: WFL for tasks assessed/performed Overall Cognitive Status: No family/caregiver present to determine baseline  cognitive functioning                                        General Comments      Exercises     Assessment/Plan    PT Assessment Patient needs continued PT services  PT Problem List Decreased strength;Decreased balance;Decreased activity tolerance;Decreased mobility;Decreased knowledge of precautions;Decreased knowledge of use of DME;Pain;Impaired sensation       PT Treatment Interventions DME instruction;Gait training;Functional mobility training;Therapeutic activities;Therapeutic exercise;Balance training;Patient/family education    PT Goals  (Current goals can be found in the Care Plan section)  Acute Rehab PT Goals Patient Stated Goal: to go to rehab to get stronger PT Goal Formulation: With patient Time For Goal Achievement: 02/27/19 Potential to Achieve Goals: Good    Frequency Min 2X/week   Barriers to discharge Decreased caregiver support      Co-evaluation               AM-PAC PT "6 Clicks" Mobility  Outcome Measure Help needed turning from your back to your side while in a flat bed without using bedrails?: Total Help needed moving from lying on your back to sitting on the side of a flat bed without using bedrails?: Total Help needed moving to and from a bed to a chair (including a wheelchair)?: Total Help needed standing up from a chair using your arms (e.g., wheelchair or bedside chair)?: Total Help needed to walk in hospital room?: Total Help needed climbing 3-5 steps with a railing? : Total 6 Click Score: 6    End of Session   Activity Tolerance: Patient limited by pain Patient left: in bed;with call bell/phone within reach Nurse Communication: Mobility status PT Visit Diagnosis: Muscle weakness (generalized) (M62.81);Difficulty in walking, not elsewhere classified (R26.2);Pain;Unsteadiness on feet (R26.81) Pain - Right/Left: Left Pain - part of body: Leg    Time: 1211-1224 PT Time Calculation (min) (ACUTE ONLY): 13 min   Charges:   PT Evaluation $PT Eval Moderate Complexity: 1 Mod          Leighton Ruff, PT, DPT  Acute Rehabilitation Services  Pager: [phone] Office: [phone]   Rudean Hitt 02/13/2019, 12:59 PM

## 2019-02-13 NOTE — Consult Note (Signed)
[place] Nurse wound consult note Reason for Consult: placement of 4 layer compression wrap to the LLE Verified with Dr. Sharol Given no topical care for the large eschar on the posterior calf Wound type: Per Dr. Jess Barters note Diabetic insensate neuropathy with end-stage renal disease with venous insufficiency with ulceration and cellulitis left calf possibly secondary to a tear of the medial head of gastrocnemius muscle ABIs reviewed by Dr. Sharol Given, ordering MD for the compression wrap. Pressure Injury POA:NA Measurement: 20+cm x 19 aprox area that is 100% eschar on the posterior calf and extends laterally to the pretibial area Wound bed: 100% eschar  Drainage (amount, consistency, odor) minimal from edges of the eschar Periwound: edema  Dressing procedure/placement/frequency: No topical care ordered per Dr. Sharol Given 4 layer compression wrap (Profore) placed on the LLE. Patient tolerated well. Explained rationale for compression. Weekly changes. Patient to report any sensation changes/pain to the bedside nurse.    Will need HHRN or weekly visits to MD for Profore wrap changes.   [place] nurse will follow along for compression wrap changes weekly while inpatient.   [place], [place], [place]

## 2019-02-13 NOTE — Progress Notes (Signed)
Patient to go to dialysis today. Verbal order to hold heparin drip for dialysis and resume once dialysis completed. Notified pharmacy of time heparin drip stopped. Will continue to monitor.

## 2019-02-13 NOTE — Progress Notes (Signed)
[place] for Heparin Indication: atrial fibrillation  Allergies  Allergen Reactions  . Neurontin [Gabapentin] Other (See Comments)    Encephalopathy with myoclonic jerking  . Ace Inhibitors Other (See Comments)    Worsening renal insufficiency    Patient Measurements: Height: 6\' 3"  (190.5 cm) Weight: 271 lb 2.7 oz (123 kg) IBW/kg (Calculated) : 84.5 Heparin Dosing Weight: 111.5   Vital Signs: Temp: 97.8 F (36.6 C) (07/28 2147) Temp Source: Oral (07/28 2147) BP: 97/61 (07/28 2147) Pulse Rate: 52 (07/28 2147)  Labs: Recent Labs    02/05/2019 1000 01/30/2019 1046 02/11/19 0325  02/12/19 0257 02/12/19 0924 02/12/19 2131 02/13/19 0233  HGB 10.0*  --  9.3*  --  9.6*  --   --  9.6*  HCT 31.5*  --  28.5*  --  29.5*  --   --  29.9*  PLT 180  --  177  --  193  --   --  217  APTT  --  42*  --   --   --   --   --   --   LABPROT  --  18.1*  --   --   --  18.8*  --   --   INR  --  1.5*  --   --   --  1.6*  --   --   HEPARINUNFRC  --   --  >2.20*   < > 0.27*  --  0.14* 0.24*  CREATININE 8.78*  --  9.65*  --  11.17*  --   --   --   CKTOTAL 198  --   --   --   --   --   --   --    < > = values in this interval not displayed.    Estimated Creatinine Clearance: 8.1 mL/min (A) (by C-G formula based on SCr of 11.17 mg/dL (H)).  Assessment: Pt is a 78 y/o male with PMH of ESRD on HD TuThSat, HTN, CHF, A-fib (on warfarin PTA), recurrent zoster, recurrent falls x3 in last week, RLE cellulitis, and recent diagnosis of new seizures on Keppra who was discharged from a skilled facility last week. Pt presented to [facility] on 07/25 after having a fall while going to the bathroom. Pt was down for approx. 5 hours before he was found down. Pt denies hitting his head or losing consciousness. He denies having any tremors, seizure like activity, or recent changes in medications. Pt was on warfarin at home PTA with an admit INR of 1.5. Pharmacy has been consulted to dose  heparin while warfarin is on hold pending possible LP.   Note, patient is now having heparin levels drawn through a foot stick, due to limited access (ok'd with Dr. Wynelle Cleveland).  Repeat heparin level remains slightly low. Noted that heparin was off previously, resumed ~2100. Per RN, bleeding at HD site has resolved.  Goal of Therapy:  Heparin level 0.3-0.7 units/ml Monitor platelets by anticoagulation protocol: Yes   Plan:  -Increase heparin slightly to 2400 units/hr -Recheck heparin level in [place], PharmD, BCPS Clinical Pharmacist Please check AMION for all [place] numbers 02/13/2019

## 2019-02-13 NOTE — Progress Notes (Signed)
Patient ID: Bruce Cole, male   DOB: 08-Jan-1941, 78 y.o.   MRN: [id number] Ankle-brachial indices were reviewed.  Patient has peripheral vascular disease with noncompressible vessels worse on the left than the right.  However he does have biphasic flow.  I will start the patient with a Profore wrap for the left lower extremity to help decrease the venous swelling and then will transition to a 2 layer medical compression stocking.

## 2019-02-13 NOTE — Plan of Care (Signed)
  Problem: Education: Goal: Knowledge of General Education information will improve Description: Including pain rating scale, medication(s)/side effects and non-pharmacologic comfort measures Outcome: Progressing   Problem: Clinical Measurements: Goal: Ability to maintain clinical measurements within normal limits will improve Outcome: Progressing Goal: Will remain free from infection Outcome: Progressing Goal: Diagnostic test results will improve Outcome: Progressing Goal: Respiratory complications will improve Outcome: Progressing Goal: Cardiovascular complication will be avoided Outcome: Progressing   Problem: Clinical Measurements: Goal: Respiratory complications will improve Outcome: Progressing   Problem: Activity: Goal: Risk for activity intolerance will decrease Outcome: Progressing   Problem: Pain Managment: Goal: General experience of comfort will improve Outcome: Progressing   Problem: Safety: Goal: Ability to remain free from injury will improve Outcome: Progressing   Problem: Skin Integrity: Goal: Risk for impaired skin integrity will decrease Outcome: Progressing   Problem: Education: Goal: Knowledge of General Education information will improve Description: Including pain rating scale, medication(s)/side effects and non-pharmacologic comfort measures Outcome: Progressing   Problem: Health Behavior/Discharge Planning: Goal: Ability to manage health-related needs will improve Outcome: Progressing

## 2019-02-14 ENCOUNTER — Inpatient Hospital Stay (HOSPITAL_COMMUNITY): Payer: Medicare Other

## 2019-02-14 ENCOUNTER — Ambulatory Visit: Payer: Medicare Other | Admitting: Physician Assistant

## 2019-02-14 DIAGNOSIS — I371 Nonrheumatic pulmonary valve insufficiency: Secondary | ICD-10-CM

## 2019-02-14 DIAGNOSIS — I361 Nonrheumatic tricuspid (valve) insufficiency: Secondary | ICD-10-CM

## 2019-02-14 DIAGNOSIS — S81802D Unspecified open wound, left lower leg, subsequent encounter: Secondary | ICD-10-CM

## 2019-02-14 DIAGNOSIS — I9589 Other hypotension: Secondary | ICD-10-CM

## 2019-02-14 DIAGNOSIS — M60062 Infective myositis, left lower leg: Secondary | ICD-10-CM

## 2019-02-14 DIAGNOSIS — I5043 Acute on chronic combined systolic (congestive) and diastolic (congestive) heart failure: Secondary | ICD-10-CM

## 2019-02-14 LAB — RENAL FUNCTION PANEL
Albumin: 2.9 g/dL — ABNORMAL LOW (ref 3.5–5.0)
Anion gap: 15 (ref 5–15)
BUN: 25 mg/dL — ABNORMAL HIGH (ref 8–23)
CO2: 26 mmol/L (ref 22–32)
Calcium: 9.4 mg/dL (ref 8.9–10.3)
Chloride: 94 mmol/L — ABNORMAL LOW (ref 98–111)
Creatinine, Ser: 5.94 mg/dL — ABNORMAL HIGH (ref 0.61–1.24)
GFR calc Af Amer: 10 mL/min — ABNORMAL LOW (ref >60)
GFR calc non Af Amer: 9 mL/min — ABNORMAL LOW (ref >60)
Glucose, Bld: 123 mg/dL — ABNORMAL HIGH (ref 70–99)
Phosphorus: 2.8 mg/dL (ref 2.5–4.6)
Potassium: 3.6 mmol/L (ref 3.5–5.1)
Sodium: 135 mmol/L (ref 135–145)

## 2019-02-14 LAB — ECHOCARDIOGRAM LIMITED
Height: 75 in
Weight: 4197.56 oz

## 2019-02-14 LAB — GLUCOSE, CAPILLARY
Glucose-Capillary: 100 mg/dL — ABNORMAL HIGH (ref 70–99)
Glucose-Capillary: 112 mg/dL — ABNORMAL HIGH (ref 70–99)
Glucose-Capillary: 114 mg/dL — ABNORMAL HIGH (ref 70–99)
Glucose-Capillary: 119 mg/dL — ABNORMAL HIGH (ref 70–99)
Glucose-Capillary: 125 mg/dL — ABNORMAL HIGH (ref 70–99)

## 2019-02-14 LAB — HEPARIN LEVEL (UNFRACTIONATED): Heparin Unfractionated: 0.23 IU/mL — ABNORMAL LOW (ref 0.30–0.70)

## 2019-02-14 LAB — CBC
HCT: 29.5 % — ABNORMAL LOW (ref 39.0–52.0)
Hemoglobin: 9.3 g/dL — ABNORMAL LOW (ref 13.0–17.0)
MCH: 30.6 pg (ref 26.0–34.0)
MCHC: 31.5 g/dL (ref 30.0–36.0)
MCV: 97 fL (ref 80.0–100.0)
Platelets: 247 10*3/uL (ref 150–400)
RBC: 3.04 MIL/uL — ABNORMAL LOW (ref 4.22–5.81)
RDW: 19.9 % — ABNORMAL HIGH (ref 11.5–15.5)
WBC: 16.7 10*3/uL — ABNORMAL HIGH (ref 4.0–10.5)
nRBC: 0.2 % (ref 0.0–0.2)

## 2019-02-14 LAB — PROTIME-INR
INR: 2 — ABNORMAL HIGH (ref 0.8–1.2)
Prothrombin Time: 22.1 seconds — ABNORMAL HIGH (ref 11.4–15.2)

## 2019-02-14 MED ORDER — ALBUMIN HUMAN 5 % IV SOLN: 12.5000 g | Freq: Once | INTRAVENOUS | Status: DC | PRN

## 2019-02-14 MED ORDER — ALBUMIN HUMAN 5 % IV SOLN
12.5000 g | Freq: Once | INTRAVENOUS | Status: DC | PRN
  Filled 2019-02-14: qty 250

## 2019-02-14 MED ORDER — LACTATED RINGERS IV BOLUS: 250.0000 mL | Freq: Once | INTRAVENOUS | Status: DC

## 2019-02-14 MED ORDER — VANCOMYCIN HCL IN DEXTROSE 1-5 GM/200ML-% IV SOLN
INTRAVENOUS | Status: AC
  Administered 2019-02-14: 17:00:00 1000 mg via INTRAVENOUS
  Filled 2019-02-14: qty 200

## 2019-02-14 MED ORDER — VITAMIN K1 10 MG/ML IJ SOLN
2.5000 mg | Freq: Once | INTRAVENOUS | Status: DC
  Filled 2019-02-14: qty 0.25

## 2019-02-14 MED ORDER — ALBUMIN HUMAN 25 % IV SOLN
INTRAVENOUS | Status: AC
  Administered 2019-02-14: 25 g via INTRAVENOUS
  Filled 2019-02-14: qty 100

## 2019-02-14 MED ORDER — ALBUMIN HUMAN 25 % IV SOLN
25.0000 g | Freq: Once | INTRAVENOUS | Status: AC
  Administered 2019-02-14: 17:00:00 25 g via INTRAVENOUS
  Filled 2019-02-14: qty 100

## 2019-02-14 NOTE — NC FL2 (Signed)
[place] LEVEL OF CARE SCREENING TOOL     IDENTIFICATION  Patient Name: Bruce Cole Birthdate: 1941-04-20 Sex: male Admission Date (Current Location): 02/07/2019  [facility] and [place] Number:  Herbalist and Address:  [facility] [place] [place] [address] [place], [place] [address]      Provider Number: [id number]  Attending Physician Name and Address:  Guilford Shi, MD  Relative Name and Phone Number:  Maddon Horton; daughter    Current Level of Care: Hospital Recommended Level of Care: [place] Prior Approval Number:    Date Approved/Denied:   PASRR Number: [id number]  Discharge Plan: SNF    Current Diagnoses: Patient Active Problem List   Diagnosis Date Noted  . Open wound of left lower extremity   . Varicose veins of left lower extremity with inflammation, with ulcer of calf limited to breakdown of skin ([place])   . Moderate protein-calorie malnutrition ([place])   . Cellulitis 02/14/2019  . Seizure ([place]) 12/09/2018  . Herpes 12/09/2018  . High risk medication use 03/17/2017  . Hyperkalemia 08/01/2016  . Aftercare following surgery of the circulatory system 06/17/2016  . Status post total replacement of right hip 03/22/2016  . ESRD on dialysis ([place]) 11/27/2015  . OSA (obstructive sleep apnea) 09/02/2013  . Chronic combined systolic and diastolic heart failure ([place]) 03/16/2013  . Allergic rhinitis 12/18/2012  . Long term current use of anticoagulant therapy 11/25/2010  . Genital herpes 05/31/2010  . DM (diabetes mellitus), type 2 with renal complications ([place]) 27/12/2374  . ERECTILE DYSFUNCTION, ORGANIC 09/21/2009  . Osteoarthritis 09/21/2009  . PERSONAL HX COLONIC POLYPS 08/25/2009  . Gout 07/22/2009  . Essential hypertension 07/22/2009  . ATRIAL FIBRILLATION 07/22/2009  . Rheumatoid arthritis ([place]) 07/22/2009  . NEPHROLITHIASIS, HX OF 07/22/2009    Orientation RESPIRATION BLADDER Height &  Weight     Self, Time, Situation, Place  O2 Continent Weight: 262 lb 5.6 oz (119 kg) Height:  6\' 3"  (190.5 cm)  BEHAVIORAL SYMPTOMS/MOOD NEUROLOGICAL BOWEL NUTRITION STATUS      Incontinent Diet(see discharge summary)  AMBULATORY STATUS COMMUNICATION OF NEEDS Skin   Extensive Assist Verbally PU Stage and Appropriate Care, Skin abrasions, Other (Comment)(abrasion on right knee; cellulitis on bilateral legs; av fistula; ecchymosis on back)   PU Stage 2 Dressing: (foam dressing on buttocks)                   Personal Care Assistance Level of Assistance  Bathing, Feeding, Dressing Bathing Assistance: Maximum assistance Feeding assistance: Independent Dressing Assistance: Maximum assistance     Functional Limitations Info  Sight, Hearing, Speech Sight Info: Impaired Hearing Info: Adequate Speech Info: Adequate    SPECIAL CARE FACTORS FREQUENCY  PT (By licensed PT), OT (By licensed OT)     PT Frequency: 5x week OT Frequency: 5x week            Contractures Contractures Info: Not present    Additional Factors Info  Code Status, Allergies, Psychotropic, Insulin Sliding Scale Code Status Info: DNR Allergies Info: Neurontin (Gabapentin); Ace Inhibitors Psychotropic Info: FLUoxetine (PROZAC) capsule 20 mg daily PO Insulin Sliding Scale Info: insulin aspart (novoLOG) injection 0-15 Units 3x daily with meals       Current Medications (02/14/2019):  This is the current hospital active medication list Current Facility-Administered Medications  Medication Dose Route Frequency Provider Last Rate Last Dose  . acetaminophen (TYLENOL) tablet 650 mg  650 mg Oral Q6H PRN Karmen Bongo, MD  650 mg at 02/13/19 2025   Or  . acetaminophen (TYLENOL) suppository 650 mg  650 mg Rectal Q6H PRN Karmen Bongo, MD      . atorvastatin (LIPITOR) tablet 10 mg  10 mg Oral Daily Karmen Bongo, MD   10 mg at 02/14/19 0944  . calcium carbonate (dosed in mg elemental calcium) suspension 500 mg  of elemental calcium  500 mg of elemental calcium Oral Q6H PRN Karmen Bongo, MD      . Chlorhexidine Gluconate Cloth 2 % PADS 6 each  6 each Topical Q0600 Janalee Dane, PA-C   6 each at 02/13/19 0606  . Chlorhexidine Gluconate Cloth 2 % PADS 6 each  6 each Topical Q0600 Janalee Dane, PA-C   6 each at 02/14/19 [phone]  . Darbepoetin Alfa (ARANESP) injection 60 mcg  60 mcg Intravenous Q Tue-HD Collins, Samantha G, PA-C   60 mcg at 02/12/19 1002  . docusate sodium (ENEMEEZ) enema 283 mg  1 enema Rectal PRN Karmen Bongo, MD      . feeding supplement (NEPRO CARB STEADY) liquid 237 mL  237 mL Oral TID PRN Karmen Bongo, MD      . feeding supplement (PRO-STAT SUGAR FREE 64) liquid 30 mL  30 mL Oral BID Anice Paganini G, PA-C   30 mL at 02/12/19 2125  . ferric citrate (AURYXIA) tablet 420 mg  420 mg Oral TID WC Karmen Bongo, MD   420 mg at 02/14/19 0944  . FLUoxetine (PROZAC) capsule 20 mg  20 mg Oral Daily Debbe Odea, MD   20 mg at 02/14/19 0944  . heparin ADULT infusion 100 units/mL (25000 units/217mL sodium chloride 0.45%)  2,400 Units/hr Intravenous Continuous Karren Cobble, RPH 24 mL/hr at 02/14/19 0942 2,400 Units/hr at 02/14/19 6237  . hydroxychloroquine (PLAQUENIL) tablet 200 mg  200 mg Oral Daily Debbe Odea, MD   200 mg at 02/14/19 0944  . hydrOXYzine (ATARAX/VISTARIL) tablet 25 mg  25 mg Oral Q8H PRN Karmen Bongo, MD      . insulin aspart (novoLOG) injection 0-15 Units  0-15 Units Subcutaneous TID WC Rizwan, Saima, MD      . levETIRAcetam (KEPPRA) tablet 250 mg  250 mg Oral Q T,Th,Sat-1800 Debbe Odea, MD   250 mg at 02/12/19 1816  . midodrine (PROAMATINE) tablet 10 mg  10 mg Oral Q M,W,F-HD Karmen Bongo, MD   10 mg at 02/13/19 1316  . midodrine (PROAMATINE) tablet 5 mg  5 mg Oral BID WC Karmen Bongo, MD   5 mg at 02/14/19 0944  . multivitamin (RENA-VIT) tablet 1 tablet  1 tablet Oral QHS Debbe Odea, MD   1 tablet at 02/13/19 2126  . ondansetron  (ZOFRAN) tablet 4 mg  4 mg Oral Q6H PRN Karmen Bongo, MD       Or  . ondansetron [facility]) injection 4 mg  4 mg Intravenous Q6H PRN Karmen Bongo, MD      . pantoprazole (PROTONIX) EC tablet 40 mg  40 mg Oral Daily Karmen Bongo, MD   40 mg at 02/14/19 0945  . piperacillin-tazobactam (ZOSYN) IVPB 3.375 g  3.375 g Intravenous Q12H Sherren Kerns D, RPH 12.5 mL/hr at 02/14/19 0943 3.375 g at 02/14/19 0943  . sevelamer carbonate (RENVELA) tablet 2,400 mg  2,400 mg Oral TID WC Karmen Bongo, MD   2,400 mg at 02/13/19 1850  . sorbitol 70 % solution 30 mL  30 mL Oral PRN Karmen Bongo, MD      . ticagrelor (  BRILINTA) tablet 90 mg  90 mg Oral BID Debbe Odea, MD   90 mg at 02/14/19 0945  . vancomycin (VANCOCIN) IVPB 1000 mg/200 mL premix  1,000 mg Intravenous Q T,Th,Sa-HD Debbe Odea, MD 200 mL/hr at 02/13/19 1658 1,000 mg at 02/13/19 1658  . vitamin B-12 (CYANOCOBALAMIN) tablet 1,000 mcg  1,000 mcg Oral Daily Debbe Odea, MD   1,000 mcg at 02/14/19 0945  . zolpidem (AMBIEN) tablet 5 mg  5 mg Oral QHS PRN Karmen Bongo, MD   5 mg at 02/14/19 0027     Discharge Medications: Please see discharge summary for a list of discharge medications.  Relevant Imaging Results:  Relevant Lab Results:   Additional Information SS#[id number] [place], [place]

## 2019-02-14 NOTE — Progress Notes (Signed)
Pharmacy Antibiotic Note  Bruce Cole is a 78 y.o. male admitted on 01/31/2019 with LLE cellulitis and necrosis + afib.  Pharmacy has been consulted for Vanco/Zosyn + heparin dosing.   Anticoag: On Warfarin PTA for A-fib (INR on admit was 1.5). Now on Heparin gtt due to procedures. HL 0.23 low but INR 1.6>>2. Hgb 9.3 stable and Plts WNL. CHA2DS2-VASc Score at least 5 PTA Coumadin : 5 mg/d - 7/28: Bleeding HD catheter site - 7/29: bleeding HD catheter again per Dr. Royce Macadamia - she will consult primary team >hold hep 7/29 at 1336. Resume after HD this evening.. - 7/30: Fistulagram today--heparin to be held 4 hrs prior   ID: LLE cellulitis and necrosis, afeb, WBC 11.8>16.8 up. Dosing for ESRD. - MRI > Extensive subcutaneous edema and mild intramuscular edema of the left lower extremity suggesting a nonspecific cellulitis and myositis.Similar findings are identified within the visualized portion of the contralateral right lower extremity. Left Achilles tendinosis without tear. - Extra HD 7/29- Vanco given  Vancomycin 07/26 >> Zosyn 07/26 >>  Cefazolin 1gm X 1  07/26 BCx: ngtd 07/26 COVID: Negative   Plan: - Hold IV heparin 4 hrs for fistulogram 7/30 (stopped when?? Not documented). Will f/u for restart. Could hold until tomorrow with INR 2. - Limited access, foot sticks ok per Dr Wynelle Cleveland - Vancomycin 1000 mg with dialysis TTS (normally MWF) - Zosyn 3.375g IV q12h (4 hour infusion)  .  Height: 6\' 3"  (190.5 cm) Weight: 262 lb 5.6 oz (119 kg) IBW/kg (Calculated) : 84.5  Temp (24hrs), Avg:97.7 F (36.5 C), Min:97.5 F (36.4 C), Max:97.7 F (36.5 C)  Recent Labs  Lab 01/20/2019 1000 02/11/19 0325 02/12/19 0257 02/13/19 0233 02/14/19 0130  WBC 15.7* 13.5* 13.4* 11.8* 16.7*  CREATININE 8.78* 9.65* 11.17* 7.94* 5.94*  LATICACIDVEN 2.0*  --   --   --   --     Estimated Creatinine Clearance: 14.9 mL/min (A) (by C-G formula based on SCr of 5.94 mg/dL (H)).    Allergies  Allergen  Reactions  . Neurontin [Gabapentin] Other (See Comments)    Encephalopathy with myoclonic jerking  . Ace Inhibitors Other (See Comments)    Worsening renal insufficiency    Sehar Sedano S. Alford Highland, PharmD, [place] Clinical Staff Pharmacist  [place], [place] 02/14/2019 8:50 AM

## 2019-02-14 NOTE — Progress Notes (Signed)
Patient tentatively planned for fistulagram today in IR due to prolonged bleeding following HD. Upon chart review this morning it was noted that patient's INR is elevated to 2.0 and WBC has increased to 16.7 from 11.8 yesterday - of note he is currently being treated for LLE cellulitis with vanc + Zosyn per primary team.  Patient reviewed with Dr. Laurence Ferrari today - in light of patient's increasing WBC and INR fistulagram will be postponed at this time. This has been relayed to [employer], PA-C by myself.  I have restarted patient's previous diet order for today. I will place orders for patient to be NPO after midnight tonight for possible procedures tomorrow (7/31), IR PA will review AM labs and discuss with attending MD.  Please call with questions or concerns.  Candiss Norse, PA-C Pager# [phone]

## 2019-02-14 NOTE — Progress Notes (Signed)
   02/14/19 1224  Vitals  Temp 98.2 F (36.8 C)  Temp Source Oral  BP (!) 86/64  MAP (mmHg) 73  BP Location Right Arm  BP Method Automatic  Patient Position (if appropriate) Lying  Pulse Rate (!) 51  Resp (!) 30  Oxygen Therapy  SpO2 100 %  MEWS Score  MEWS RR 2  MEWS Pulse 0  MEWS Systolic 1  MEWS LOC 0  MEWS Temp 0  MEWS Score 3  MEWS Score Color Yellow  Kamineni, MD made aware
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
## 2019-02-14 NOTE — Progress Notes (Signed)
  Echocardiogram 2D Echocardiogram has been performed.  Bruce Cole 02/14/2019, 10:42 AM

## 2019-02-14 NOTE — Evaluation (Signed)
Occupational Therapy Evaluation Patient Details Name: Bruce Cole MRN: [id number] DOB: 08-01-1940 Today's Date: 02/14/2019    History of Present Illness Pt is a 78 y/o male admitted secondary to fall and seizure. Also found to have LLE wound and cellulitis. PMH includes HTN, a fib, DM, ESRD on HD, gout, and CHF.    Clinical Impression   Pt was assisted for ADL and IADL by an aide and housekeeper and ambulated with a walker prior to admission. He is a retired principal. Pt presents with L LE pain, generalized weakness, and severe shoulder limitations as a result of RA per his report. Pt requires min to total assist for ADL and needed max assist for bed mobility. Pt is aware he needs more assistance than can be provided in his home and is seeking eventual ALF after rehab. Will follow acutely.    Follow Up Recommendations  SNF;Supervision/Assistance - 24 hour    Equipment Recommendations  None recommended by OT    Recommendations for Other Services       Precautions / Restrictions Precautions Precautions: Fall Restrictions Weight Bearing Restrictions: No      Mobility Bed Mobility Overal bed mobility: Needs Assistance Bed Mobility: Supine to Sit;Sit to Supine     Supine to sit: Max assist Sit to supine: Max assist   General bed mobility comments: assist for all aspects  Transfers                 General transfer comment: deferred    Balance Overall balance assessment: Needs assistance Sitting-balance support: Bilateral upper extremity supported;Feet supported Sitting balance-Leahy Scale: Fair Sitting balance - Comments: statically                                   ADL either performed or assessed with clinical judgement   ADL Overall ADL's : Needs assistance/impaired Eating/Feeding: Minimal assistance;Bed level Eating/Feeding Details (indicate cue type and reason): assist to open containers, cut food Grooming: Wash/dry hands;Bed  level;Set up                                 General ADL Comments: pt is dependent in bathing and dressing at baseline, not tested     Vision Baseline Vision/History: Wears glasses Wears Glasses: At all times Patient Visual Report: No change from baseline       Perception     Praxis      Pertinent Vitals/Pain Pain Assessment: Faces Faces Pain Scale: Hurts even more Pain Location: LLE  Pain Descriptors / Indicators: Grimacing;Guarding Pain Intervention(s): Repositioned;Monitored during session     Hand Dominance Right   Extremity/Trunk Assessment Upper Extremity Assessment Upper Extremity Assessment: Generalized weakness(significant weakness in B shoulders due to RA)   Lower Extremity Assessment Lower Extremity Assessment: Defer to PT evaluation       Communication Communication Communication: No difficulties   Cognition Arousal/Alertness: Awake/alert Behavior During Therapy: WFL for tasks assessed/performed Overall Cognitive Status: No family/caregiver present to determine baseline cognitive functioning                                 General Comments: slow processing, but realistic that he is unsafe at home   General Comments       Exercises     Shoulder Instructions  Home Living Family/patient expects to be discharged to:: Private residence Living Arrangements: Alone Available Help at Discharge: Personal care attendant(4 hours a day) Type of Home: Other(Comment)(townhome) Home Access: Ramped entrance     [place]: Two level;Able to live on main level with bedroom/bathroom     Bathroom Shower/Tub: Occupational psychologist: Standard     Home Equipment: Environmental consultant - 2 wheels;Bedside commode;Shower seat          Prior Functioning/Environment Level of Independence: Needs assistance  Gait / Transfers Assistance Needed: Pt reports he was using RW for ambulation, however, had been having more difficulty  ambulating ADL's / Homemaking Assistance Needed: Pt requires assist from aide for bathing/dressing meal prep, has housekeeper also            OT Problem List: Decreased strength;Decreased activity tolerance;Impaired balance (sitting and/or standing);Decreased coordination;Decreased knowledge of use of DME or AE;Pain;Impaired UE functional use      OT Treatment/Interventions: Self-care/ADL training;Patient/family education;Balance training;Therapeutic activities;DME and/or AE instruction    OT Goals(Current goals can be found in the care plan section) Acute Rehab OT Goals Patient Stated Goal: to go to rehab to get stronger and then to ALF OT Goal Formulation: With patient Time For Goal Achievement: 02/28/19 Potential to Achieve Goals: Good ADL Goals Pt Will Perform Grooming: with min assist;sitting Pt Will Perform Upper Body Dressing: with mod assist;sitting Pt Will Transfer to Toilet: with +2 assist;with min assist;ambulating;bedside commode Additional ADL Goal #1: Pt will perform bed mobility with moderate assistance in preparation for ADL.  OT Frequency: Min 2X/week   Barriers to D/C: Decreased caregiver support          Co-evaluation              AM-PAC OT "6 Clicks" Daily Activity     Outcome Measure Help from another person eating meals?: A Little Help from another person taking care of personal grooming?: A Lot Help from another person toileting, which includes using toliet, bedpan, or urinal?: Total Help from another person bathing (including washing, rinsing, drying)?: Total Help from another person to put on and taking off regular upper body clothing?: A Lot Help from another person to put on and taking off regular lower body clothing?: Total 6 Click Score: 10   End of Session    Activity Tolerance: Patient tolerated treatment well Patient left: in bed;with call bell/phone within reach  OT Visit Diagnosis: Pain;Muscle weakness (generalized)  (M62.81);Unsteadiness on feet (R26.81)                Time: 2751-7001 OT Time Calculation (min): 34 min Charges:  OT General Charges $OT Visit: 1 Visit OT Evaluation $OT Eval Moderate Complexity: 1 Mod OT Treatments $Self Care/Home Management : 8-22 mins  Nestor Lewandowsky, OTR/L Acute Rehabilitation Services Pager: [phone] Office: [phone]  Malka So 02/14/2019, 3:17 PM

## 2019-02-14 NOTE — Progress Notes (Signed)
Vital signs taken and charted in Epic CBG taken 119. Arthor Captain LPN

## 2019-02-14 NOTE — Progress Notes (Signed)
PROGRESS NOTE    Bruce Cole  QBH:[id number]  DOB: 1941-01-12  DOA: 02/04/2019 PCP: Debbrah Alar, NP  Brief Narrative:  78 y.o. male with medical history significant of ESRD on MWF HD,chronic hypotension on Midodrin,  A. fib on Coumadin; chronic zoster on Valtrex; rheumatoid arthritis on Plaquenil, chronic combined CHF who was admitted to this [facility] in May for falls/possible seizures and started on Keppra by neurology-> discharged to acute rehab-> home on 6/17 with home health now presents with generalized weakness, recurrent falls x3 over the last week. He presented to the ED after a fall ( slid down to the floor as he was feeling weak and could not unlock the walker while transitioning off commode).He stayed on the floor for ~5 hours until caregiver found him.Patient lives alone; has a nurse coming in in the mornings and son/grandchildren check on him in the evening. During his prior admission, he fell on his hip and was complaining about pain.  He was discharged to [facility] rehab and a scar developed on the back of his leg.He presented to [facility] where he was noted to have a significant area of necrosis on his left posterior calf with foul-smelling discharge and significant edema of most of the LLE.  Patient admitted with empiric antibiotics (Vanco/Zosyn) and orthopedics consulted.  MRI obtained on 7/27 showed extensive subcutaneous edema /mild intramuscular edema of the LLE concerning for cellulitis/myositis.  There was no evidence of necrotizing fasciitis or abscess or osteomyelitis.  Patient has also been seen by nephrology and receiving HD (changed to TTS as inpatient).  Hospital course has been complicated by prolonged bleeding from AV fistula postdialysis.  Patient planned for fistulogram and has been initiated on heparin while Coumadin on hold.  Subjective:  Patient resting comfortably.  Denies any shortness of breath today.  Right lower extremity with compression  wraps but foul-smelling discharge.    Objective: Vitals:   02/13/19 1743 02/13/19 1816 02/14/19 0316 02/14/19 0749  BP: 101/76 (!) 85/51 (!) 124/97   Pulse: 71 (!) 53 (!) 158 (!) 107  Resp: 18 20    Temp: (!) 97.5 F (36.4 C) 97.7 F (36.5 C)    TempSrc: Oral Oral    SpO2: 94% 93% 99%   Weight: 119 kg     Height:        Intake/Output Summary (Last 24 hours) at 02/14/2019 1145 Last data filed at 02/14/2019 0400 Gross per 24 hour  Intake 600 ml  Output 3000 ml  Net -2400 ml   Filed Weights   02/12/19 1100 02/13/19 1405 02/13/19 1743  Weight: 123 kg 124 kg 119 kg    Physical Examination:  General exam: Appears awake, alert, calm and comfortable  Respiratory system: Clear to auscultation. Respiratory effort normal. Cardiovascular system: S1 & S2 heard, RRR. No JVD, murmurs, rubs, gallops or clicks. Gastrointestinal system: Abdomen is nondistended, soft and nontender. No organomegaly or masses felt. Normal bowel sounds heard. Central nervous system: Alert and oriented. No focal neurological deficits. Extremities: Symmetric 4 x 5 power. Skin: He has 2 stage 1 pressure ulcers on his right buttock.  He was noted to have a significant area of necrosis on his left posterior calf that is quite foul-smelling with surrounding erythema and somewhat taut edema of most of the LLE/now has compression dressing in place by orthopedics.  No evidence of redness noted currently and no RLE although on admission there was some evidence of milder trauma and mild erythema without frank cellulitis(pictures in  media) Psychiatry: Judgement and insight appear normal. Mood & affect appropriate.     Data Reviewed: I have personally reviewed following labs and imaging studies  CBC: Recent Labs  Lab 02/14/2019 1000 02/11/19 0325 02/12/19 0257 02/13/19 0233 02/14/19 0130  WBC 15.7* 13.5* 13.4* 11.8* 16.7*  NEUTROABS 13.6*  --   --   --   --   HGB 10.0* 9.3* 9.6* 9.6* 9.3*  HCT 31.5* 28.5* 29.5*  29.9* 29.5*  MCV 96.9 94.4 92.8 95.8 97.0  PLT 180 177 193 217 762   Basic Metabolic Panel: Recent Labs  Lab 02/09/2019 1000 02/11/19 0325 02/12/19 0257 02/13/19 0233 02/14/19 0130  NA 133* 131* 130* 134* 135  K 3.6 3.3* 3.7 3.7 3.6  CL 89* 91* 90* 94* 94*  CO2 25 26 21* 22 26  GLUCOSE 132* 110* 126* 118* 123*  BUN 67* 71* 88* 47* 25*  CREATININE 8.78* 9.65* 11.17* 7.94* 5.94*  CALCIUM 9.4 8.7* 8.9 9.0 9.4  PHOS  --   --  4.0 3.0 2.8   GFR: Estimated Creatinine Clearance: 14.9 mL/min (A) (by C-G formula based on SCr of 5.94 mg/dL (H)). Liver Function Tests: Recent Labs  Lab 01/20/2019 1000 02/12/19 0257 02/13/19 0233 02/14/19 0130  AST 30  --   --   --   ALT 18  --   --   --   ALKPHOS 269*  --   --   --   BILITOT 1.2  --   --   --   PROT 7.9  --   --   --   ALBUMIN 3.3* 2.7* 2.7* 2.9*   No results for input(s): LIPASE, AMYLASE in the last 168 hours. No results for input(s): AMMONIA in the last 168 hours. Coagulation Profile: Recent Labs  Lab 02/03/2019 1046 02/12/19 0924 02/14/19 0130  INR 1.5* 1.6* 2.0*   Cardiac Enzymes: Recent Labs  Lab 02/09/2019 1000  CKTOTAL 198   BNP (last 3 results) No results for input(s): PROBNP in the last 8760 hours. HbA1C: No results for input(s): HGBA1C in the last 72 hours. CBG: Recent Labs  Lab 02/13/19 1219 02/13/19 1840 02/13/19 2107 02/14/19 0314 02/14/19 0800  GLUCAP 156* 84 141* 119* 125*   Lipid Profile: No results for input(s): CHOL, HDL, LDLCALC, TRIG, CHOLHDL, LDLDIRECT in the last 72 hours. Thyroid Function Tests: No results for input(s): TSH, T4TOTAL, FREET4, T3FREE, THYROIDAB in the last 72 hours. Anemia Panel: No results for input(s): VITAMINB12, FOLATE, FERRITIN, TIBC, IRON, RETICCTPCT in the last 72 hours. Sepsis Labs: Recent Labs  Lab 01/23/2019 1000  LATICACIDVEN 2.0*    Recent Results (from the past 240 hour(s))  Blood Culture (routine x 2)     Status: None (Preliminary result)   Collection  Time: 02/13/2019  9:30 AM   Specimen: BLOOD RIGHT ARM  Result Value Ref Range Status   Specimen Description   Final    BLOOD RIGHT ARM Performed at [facility], [address] [place], [place] [address]    Special Requests   Final    BOTTLES DRAWN AEROBIC AND ANAEROBIC Blood Culture adequate volume Performed at [facility], [address] [place], [place] [address]    Culture   Final    NO GROWTH 4 DAYS Performed at [facility], [place] [address], [place], [place] [address]    Report Status PENDING  Incomplete  Blood Culture (routine x 2)     Status: None (Preliminary result)  Collection Time: 02/14/2019  9:50 AM   Specimen: BLOOD RIGHT HAND  Result Value Ref Range Status   Specimen Description   Final    BLOOD RIGHT HAND Performed at [facility], [address] [place], [place] [address]    Special Requests   Final    BOTTLES DRAWN AEROBIC AND ANAEROBIC Blood Culture results may not be optimal due to an inadequate volume of blood received in culture bottles Performed at [facility], [address] [place], [place] [address]    Culture   Final    NO GROWTH 4 DAYS Performed at [facility], [place] [address], [place], [place] [address]    Report Status PENDING  Incomplete  SARS Coronavirus 2 (Performed in [place] hospital lab)     Status: None   Collection Time: 02/12/2019 12:29 PM   Specimen: Nasopharyngeal Swab  Result Value Ref Range Status   SARS Coronavirus 2 NEGATIVE NEGATIVE Final    Comment: (NOTE) If result is NEGATIVE SARS-CoV-2 target nucleic acids are NOT DETECTED. The SARS-CoV-2 RNA is generally detectable in upper and lower  respiratory specimens during the acute phase of infection. The lowest  concentration of SARS-CoV-2 viral copies this assay can detect is 250  copies / mL. A negative result does not preclude SARS-CoV-2 infection  and should not be used as the sole basis for treatment or other    patient management decisions.  A negative result may occur with  improper specimen collection / handling, submission of specimen other  than nasopharyngeal swab, presence of viral mutation(s) within the  areas targeted by this assay, and inadequate number of viral copies  (<250 copies / mL). A negative result must be combined with clinical  observations, patient history, and epidemiological information. If result is POSITIVE SARS-CoV-2 target nucleic acids are DETECTED. The SARS-CoV-2 RNA is generally detectable in upper and lower  respiratory specimens dur ing the acute phase of infection.  Positive  results are indicative of active infection with SARS-CoV-2.  Clinical  correlation with patient history and other diagnostic information is  necessary to determine patient infection status.  Positive results do  not rule out bacterial infection or co-infection with other viruses. If result is PRESUMPTIVE POSTIVE SARS-CoV-2 nucleic acids MAY BE PRESENT.   A presumptive positive result was obtained on the submitted specimen  and confirmed on repeat testing.  While 2019 novel coronavirus  (SARS-CoV-2) nucleic acids may be present in the submitted sample  additional confirmatory testing may be necessary for epidemiological  and / or clinical management purposes  to differentiate between  SARS-CoV-2 and other Sarbecovirus currently known to infect humans.  If clinically indicated additional testing with an alternate test  methodology [phone]) is advised. The SARS-CoV-2 RNA is generally  detectable in upper and lower respiratory sp ecimens during the acute  phase of infection. The expected result is Negative. Fact Sheet for Patients:  StrictlyIdeas.no Fact Sheet for Healthcare Providers: BankingDealers.co.za This test is not yet approved or cleared by the [place] FDA and has been authorized for detection and/or diagnosis of SARS-CoV-2  by FDA under an Emergency Use Authorization (EUA).  This EUA will remain in effect (meaning this test can be used) for the duration of the COVID-19 declaration under Section 564(b)(1) of the Act, 21 U.S.C. section 360bbb-3(b)(1), unless the authorization is terminated or revoked sooner. Performed at [facility], [address], [place], [place] [address]  Radiology Studies: Dg Chest 2 View  Result Date: 02/13/2019 CLINICAL DATA:  Shortness of breath EXAM: CHEST - 2 VIEW COMPARISON:  02/12/2019 FINDINGS: Moderate cardiomegaly, unchanged. Calcific thoracic aorta. Mildly prominent perihilar interstitial markings. No new focal airspace consolidation. No large pleural fluid collection. No pneumothorax. IMPRESSION: Cardiomegaly with mild perihilar interstitial opacities suggesting CHF/fluid overload. Electronically Signed   By: Davina Poke M.D.   On: 02/13/2019 12:56   Vas [place] Burnard Bunting With/wo Tbi  Result Date: 02/12/2019 LOWER EXTREMITY DOPPLER STUDY Indications: Gangrene. High Risk Factors: Hypertension, Diabetes.  Limitations: patient movement Comparison Study: No prior studies. Performing Technologist: Carlos Levering Rvt  Examination Guidelines: A complete evaluation includes at minimum, Doppler waveform signals and systolic blood pressure reading at the level of bilateral brachial, anterior tibial, and posterior tibial arteries, when vessel segments are accessible. Bilateral testing is considered an integral part of a complete examination. Photoelectric Plethysmograph (PPG) waveforms and toe systolic pressure readings are included as required and additional duplex testing as needed. Limited examinations for reoccurring indications may be performed as noted.  ABI Findings: +--------+------------------+-----+---------+--------+  Right    Rt Pressure (mmHg) Index Waveform  Comment   +--------+------------------+-----+---------+--------+  Brachial 102                      triphasic            +--------+------------------+-----+---------+--------+  PTA      102                1.00  biphasic            +--------+------------------+-----+---------+--------+  DP       143                1.40  biphasic            +--------+------------------+-----+---------+--------+ +---------+------------------+-----+--------+-------+  Left      Lt Pressure (mmHg) Index Waveform Comment  +---------+------------------+-----+--------+-------+  PTA       211                2.07  biphasic          +---------+------------------+-----+--------+-------+  DP        244                2.39  biphasic          +---------+------------------+-----+--------+-------+  Great Toe 53                 0.52                    +---------+------------------+-----+--------+-------+ +-------+-----------+-----------+------------+------------+  ABI/TBI Today's ABI Today's TBI Previous ABI Previous TBI  +-------+-----------+-----------+------------+------------+  Right   1.4                                                +-------+-----------+-----------+------------+------------+  Left    2.39        0.52                                   +-------+-----------+-----------+------------+------------+  Summary: Right: Resting right ankle-brachial index indicates noncompressible right lower extremity arteries. Unable to obtain TBI due to low amplitude waveforms. Left: Resting left ankle-brachial index indicates noncompressible left lower extremity arteries.The left toe-brachial index is abnormal.  *  See table(s) above for measurements and observations.  Electronically signed by Deitra Mayo MD on 02/12/2019 at 5:52:38 PM.   Final         Scheduled Meds:  atorvastatin  10 mg Oral Daily   Chlorhexidine Gluconate Cloth  6 each Topical Q0600   Chlorhexidine Gluconate Cloth  6 each Topical Q0600   darbepoetin (ARANESP) injection - DIALYSIS  60 mcg Intravenous Q Tue-HD   feeding supplement (PRO-STAT SUGAR FREE 64)  30 mL Oral BID    FLUoxetine  20 mg Oral Daily   hydroxychloroquine  200 mg Oral Daily   insulin aspart  0-15 Units Subcutaneous TID WC   levETIRAcetam  250 mg Oral Q T,Th,Sat-1800   midodrine  10 mg Oral Q M,W,F-HD   midodrine  5 mg Oral BID WC   multivitamin  1 tablet Oral QHS   pantoprazole  40 mg Oral Daily   ticagrelor  90 mg Oral BID   vitamin B-12  1,000 mcg Oral Daily   Continuous Infusions:  heparin 2,400 Units/hr (02/14/19 0942)   piperacillin-tazobactam (ZOSYN)  IV 3.375 g (02/14/19 0943)   vancomycin 1,000 mg (02/13/19 1658)    Assessment & Plan:    1.  Left lower extremity wound necrosis with cellulitis/myositis: On IV Zosyn/vancomycin (dialysis patient).  White count up today at 16 K (was at 11.8 yesterday).  Been afebrile during the hospital course.  Seen by orthopedics and underwent ABI which indicate peripheral vascular disease than right but with biphasic flow.  Patient now has venous compression wrap of left lower extremity per Dr. Sharol Given to help reduce venous swelling.  Not sure if candidate for deeper debridement given poor circulation.  Chest negative so far.?  Wound cultures.  Of note, patient on Plaquenil at baseline for rheumatoid arthritis.  Will hold in the setting of acute infection.  Watch systolic blood pressure as noted to be fluctuating between 80 to 100.  Cautious with IV hydration given significant LE edema, concern for CHF and ESRD.  Patient also has chronic hypotension, on midodrine at baseline.  2.  ESRD/bleeding at AV fistula site: On hemodialysis TTS to accommodate patient census  (usually on MWF schedule as outpatient).  And has had episodes of prolonged bleeding after hemodialysis as outpatient and while here.  Hold heparin.  Additional vitamin K dosing given Coumadin level at 2.  Plan for fistulogram by IR in a.m.  Patient also on Brilinta.  Monitor hemoglobin and transfuse as needed  3.  Chronic hypotension: Watch for worsening in the setting of problem #1.   Cautious IV hydration as discussed above.  Continue high doses of midodrine.  4.  Chronic atrial fibrillation: Holding Coumadin and concern for problem #2.  Hold heparin as well.  Rate controlled, cannot tolerate AV blockers due to problem #3  5.  Recurrent falls,?  Seizures: Was following neurology as outpatient and family feels his condition declines since Keppra dosing tapered.  Currently on 250 mg 3 times weekly.  PT evaluation, likely will need ALF.  6.  Acute on chronic combined CHF: Asymmetrical leg edema likely related to venous insufficiency and CHF.  Not on any other cardiac meds due to hypotension.  Patient however complained of dyspnea yesterday and chest x-ray was suggestive of pulmonary edema.  Scheduled for dialysis today and feels better.  Saturating 100% on 3 L nasal cannula.  Taper as tolerated.  7.  Chronic anemia: Monitor for acute decompensation in the setting of problem #2.  On Aranesp  q. weekly.  Also on vitamin B12 supplements.  8.  Rheumatoid arthritis: Hold Plaquenil for now.  9.  Peripheral vascular disease: On Brilinta/statins.  10.  Hyperlipidemia: On Lipitor.  11. Diabetes mellitus type 2: Sliding scale insulin for now.  On Victoza/Levemir as outpatient.  12 . Anxiety/depression: On Prozac/hydroxyzine.   DVT prophylaxis: Anticoagulated with Coumadin at baseline.  INR at 2 today Code Status: DNR Family / Patient Communication: Discussed with patient Disposition Plan: Seen by PT who recommended skilled nursing facility, alternatively 24-hour assistance/supervision.    LOS: 4 days    Time spent: 41 minutes    Guilford Shi, MD [facility] Pager [phone]  If 7PM-7AM, please contact night-coverage [email or web address] Password [email or web address] 02/14/2019, 11:45 AM

## 2019-02-14 NOTE — Progress Notes (Signed)
Patient ID: Bruce Cole, male   DOB: 05-08-1941, 78 y.o.   MRN: [id number] Patient had blisters on the left lower extremity with venous insufficiency.  Ankle-brachial indices were elevated secondary to calcified vessels with biphasic flow.  Patient had a 4 layer wrap applied, the outer layer made this more painful and  this was removed.  Patient states that he feels better now he can sleep.  Will continue the compression wrap while he is in the hospital and will transition to a compression sock as an outpatient.

## 2019-02-14 NOTE — Progress Notes (Signed)
Patient asked this RN to assist him to call an Melburn Popper or his son so he can leave. Told patient the physicians have not medically cleared him to be discharged, so if he were to leave it would be against medical advice. Patient stated "I am going to call my son so he can wheel him out of here". I reiterated he was not medically cleared to be discharged and provided education on our against medical advice form. He went on to state "I'm going to call my son". Will continue to monitor this patient and fill out AMA paperwork if needed.

## 2019-02-14 NOTE — Progress Notes (Signed)
Patient was very agitated and had frequent request during the night. Nothing that staff did seems to calm him down. He kept stating that he is "treated like a dog" when I asked to give give example he would state to be treated like a human being. When I asked what he needed he would repeat to be treated like a human being. Then he stated that I was laughing at him yesterday morning. When I told him I work night shift he stated I don't care what shift you think you work and acted like he wanted to argue so I asked him again what can I help you with. He then stated again to be treated like a human being. Charge RN had multiple consults with him. Will continue to monitor. Arthor Captain LPN

## 2019-02-14 NOTE — Care Management Important Message (Signed)
Important Message  Patient Details  Name: Bruce Cole MRN: [id number] Date of Birth: 23-Feb-1941   Medicare Important Message Given:  Yes     Memory Argue 02/14/2019, 2:21 PM

## 2019-02-14 NOTE — Progress Notes (Addendum)
[facility] Progress Note   Subjective:   Patient seen in room. Back on HD schedule with treatment yesterday, net UF 3.0L. He reports his shortness of breath is mild now and is significantly improved. 2D echo just completed- results pending. Denies CP, palpitations, abdominal pain, N/V/D. IR unable to complete fistulogram today due to INR/WBC count, tentatively planned for tomorrow.   Objective Vitals:   02/13/19 1743 02/13/19 1816 02/14/19 0316 02/14/19 0749  BP: 101/76 (!) 85/51 (!) 124/97   Pulse: 71 (!) 53 (!) 158 (!) 107  Resp: 18 20    Temp: (!) 97.5 F (36.4 C) 97.7 F (36.5 C)    TempSrc: Oral Oral    SpO2: 94% 93% 99%   Weight: 119 kg     Height:       Physical Exam General:Well developed male, alert and in NAD. O2 via nasal cannula.  Heart:RRR, no murmurs, rubs or gallops Lungs:Decreased breath sounds bilateral bases. No wheezing, rhonchi or rales appreciated. Exam limited due to patient positioning.  Abdomen:Soft, non-tender. +BS Extremities:1+ edema bilateral lower extremities. LLE wrapped Dialysis Access:RUE AVF + bruit  Additional Objective Labs: Basic Metabolic Panel: Recent Labs  Lab 02/12/19 0257 02/13/19 0233 02/14/19 0130  NA 130* 134* 135  K 3.7 3.7 3.6  CL 90* 94* 94*  CO2 21* 22 26  GLUCOSE 126* 118* 123*  BUN 88* 47* 25*  CREATININE 11.17* 7.94* 5.94*  CALCIUM 8.9 9.0 9.4  PHOS 4.0 3.0 2.8   Liver Function Tests: Recent Labs  Lab 02/12/2019 1000 02/12/19 0257 02/13/19 0233 02/14/19 0130  AST 30  --   --   --   ALT 18  --   --   --   ALKPHOS 269*  --   --   --   BILITOT 1.2  --   --   --   PROT 7.9  --   --   --   ALBUMIN 3.3* 2.7* 2.7* 2.9*   CBC: Recent Labs  Lab 01/21/2019 1000 02/11/19 0325 02/12/19 0257 02/13/19 0233 02/14/19 0130  WBC 15.7* 13.5* 13.4* 11.8* 16.7*  NEUTROABS 13.6*  --   --   --   --   HGB 10.0* 9.3* 9.6* 9.6* 9.3*  HCT 31.5* 28.5* 29.5* 29.9* 29.5*  MCV 96.9 94.4 92.8 95.8 97.0   PLT 180 177 193 217 247   Blood Culture    Component Value Date/Time   SDES  02/01/2019 0950    BLOOD RIGHT HAND Performed at [facility], [address] [place], [place] [address]    [facility]  02/13/2019 0950    BOTTLES DRAWN AEROBIC AND ANAEROBIC Blood Culture results may not be optimal due to an inadequate volume of blood received in culture bottles Performed at [facility], [address], [place], [place] [address]    CULT  02/03/2019 0950    NO GROWTH 4 DAYS Performed at [facility], [place] [address], [place], [place] [address]    REPTSTATUS PENDING 02/05/2019 0950    Cardiac Enzymes: Recent Labs  Lab 02/04/2019 1000  CKTOTAL 198   CBG: Recent Labs  Lab 02/13/19 1219 02/13/19 1840 02/13/19 2107 02/14/19 0314 02/14/19 0800  GLUCAP 156* 84 141* 119* 125*    Studies/Results: Dg Chest 2 View  Result Date: 02/13/2019 CLINICAL DATA:  Shortness of breath EXAM: CHEST - 2 VIEW COMPARISON:  01/30/2019 FINDINGS: Moderate cardiomegaly, unchanged. Calcific thoracic aorta. Mildly prominent perihilar interstitial markings. No  new focal airspace consolidation. No large pleural fluid collection. No pneumothorax. IMPRESSION: Cardiomegaly with mild perihilar interstitial opacities suggesting CHF/fluid overload. Electronically Signed   By: Davina Poke M.D.   On: 02/13/2019 12:56   Vas [place] Burnard Bunting With/wo Tbi  Result Date: 02/12/2019 LOWER EXTREMITY DOPPLER STUDY Indications: Gangrene. High Risk Factors: Hypertension, Diabetes.  Limitations: patient movement Comparison Study: No prior studies. Performing Technologist: Carlos Levering Rvt  Examination Guidelines: A complete evaluation includes at minimum, Doppler waveform signals and systolic blood pressure reading at the level of bilateral brachial, anterior tibial, and posterior tibial arteries, when vessel segments are accessible. Bilateral testing is considered an integral part of a complete examination.  Photoelectric Plethysmograph (PPG) waveforms and toe systolic pressure readings are included as required and additional duplex testing as needed. Limited examinations for reoccurring indications may be performed as noted.  ABI Findings: +--------+------------------+-----+---------+--------+ Right   Rt Pressure (mmHg)IndexWaveform Comment  +--------+------------------+-----+---------+--------+ Brachial102                    triphasic         +--------+------------------+-----+---------+--------+ PTA     102               1.00 biphasic          +--------+------------------+-----+---------+--------+ DP      143               1.40 biphasic          +--------+------------------+-----+---------+--------+ +---------+------------------+-----+--------+-------+ Left     Lt Pressure (mmHg)IndexWaveformComment +---------+------------------+-----+--------+-------+ PTA      211               2.07 biphasic        +---------+------------------+-----+--------+-------+ DP       244               2.39 biphasic        +---------+------------------+-----+--------+-------+ Great Toe53                0.52                 +---------+------------------+-----+--------+-------+ +-------+-----------+-----------+------------+------------+ ABI/TBIToday's ABIToday's TBIPrevious ABIPrevious TBI +-------+-----------+-----------+------------+------------+ Right  1.4                                            +-------+-----------+-----------+------------+------------+ Left   2.39       0.52                                +-------+-----------+-----------+------------+------------+  Summary: Right: Resting right ankle-brachial index indicates noncompressible right lower extremity arteries. Unable to obtain TBI due to low amplitude waveforms. Left: Resting left ankle-brachial index indicates noncompressible left lower extremity arteries.The left toe-brachial index is abnormal.  *See  table(s) above for measurements and observations.  Electronically signed by Deitra Mayo MD on 02/12/2019 at 5:52:38 PM.   Final    Medications: . heparin 2,400 Units/hr (02/14/19 0942)  . piperacillin-tazobactam (ZOSYN)  IV 3.375 g (02/14/19 0943)  . vancomycin 1,000 mg (02/13/19 1658)   . atorvastatin  10 mg Oral Daily  . Chlorhexidine Gluconate Cloth  6 each Topical Q0600  . Chlorhexidine Gluconate Cloth  6 each Topical Q0600  . darbepoetin (ARANESP) injection - DIALYSIS  60 mcg Intravenous Q Tue-HD  . feeding supplement (PRO-STAT SUGAR FREE 64)  30  mL Oral BID  . ferric citrate  420 mg Oral TID WC  . FLUoxetine  20 mg Oral Daily  . hydroxychloroquine  200 mg Oral Daily  . insulin aspart  0-15 Units Subcutaneous TID WC  . levETIRAcetam  250 mg Oral Q T,Th,Sat-1800  . midodrine  10 mg Oral Q M,W,F-HD  . midodrine  5 mg Oral BID WC  . multivitamin  1 tablet Oral QHS  . pantoprazole  40 mg Oral Daily  . sevelamer carbonate  2,400 mg Oral TID WC  . ticagrelor  90 mg Oral BID  . vitamin B-12  1,000 mcg Oral Daily    Dialysis Orders: Center:[facility] MWF. 2K/ 2.25 Ca; EDW 123.5kg (last post weight +3.4kg); LUE AVF Heparin 2000 unit bolus Mircera 169mcg IV q 2 weeks  Renvela 800mg  3 tabs PO TID   Assessment/Plan: 1. Cellulitis:Left lower extremity, seen by ortho.MRI completed  without osteomyelitis, plan is for compression per ortho. WBC elevated this AM. On vancomycin and zosyn. Per primary/ortho. Also has pressure injury on right buttocks, wound care following.  2. ESRD:Normally dialyzes MWF at [facility], dialyzed 7/28 and 7/29 for volume, now back on MWF schedule. Reports SOB has improved. Cardiomegaly on CXR yesterday. 2D echo just completed and results pending.  3. Bleeding from AVF: Prolonged bleeding after dialysis on 7/28. Heparin held with HD yesterday and no prolonged bleeding reported. IR has been consulted for fistulogram but could not complete  today due to elevated WBC and INR. Tentatively planning for tomorrow. Will likely need to hold heparin drip with dialysis again.  4. Hypertension/volume:Blood pressure chronically low, has received albumin for blood pressure support during dialysis. On midodrine.Still with some edema. Chest x-ray 7/30 revealed cardiomegaly with mild perihilar interstitial opacities suggesting CHF/fluid overload. Echo pending. Completed HD yesterday with UF 3L. Still some SOB today, will plan for extra HD today 2-3L with albumin for BP support. 5. Anemia:Hemoglobin 9.3.Continuearanesp 22mcg IV q week with dialysis. 6. Metabolic bone disease:Calcium 9.4. Last outpatient phos 3.7, PTH 241. Phos 2.8, will hold binders for now.  7. Nutrition:Albumin 2.9. Continue supplements.  8. A. Fib on coumadin: INR subtherapeutic on admission, currently on heparin. Per primary.  9. T2DM: Currently in SSI, per primary. 10.  Seizure disorder:On Keppra    Anice Paganini, PA-C 02/14/2019, 10:55 AM  [facility] Pager: [phone]

## 2019-02-15 DIAGNOSIS — I5021 Acute systolic (congestive) heart failure: Secondary | ICD-10-CM

## 2019-02-15 DIAGNOSIS — I4819 Other persistent atrial fibrillation: Secondary | ICD-10-CM

## 2019-02-15 DIAGNOSIS — R931 Abnormal findings on diagnostic imaging of heart and coronary circulation: Secondary | ICD-10-CM

## 2019-02-15 LAB — CBC
HCT: 29.4 % — ABNORMAL LOW (ref 39.0–52.0)
Hemoglobin: 9.2 g/dL — ABNORMAL LOW (ref 13.0–17.0)
MCH: 30.7 pg (ref 26.0–34.0)
MCHC: 31.3 g/dL (ref 30.0–36.0)
MCV: 98 fL (ref 80.0–100.0)
Platelets: 254 10*3/uL (ref 150–400)
RBC: 3 MIL/uL — ABNORMAL LOW (ref 4.22–5.81)
RDW: 19.8 % — ABNORMAL HIGH (ref 11.5–15.5)
WBC: 14.8 10*3/uL — ABNORMAL HIGH (ref 4.0–10.5)
nRBC: 0.2 % (ref 0.0–0.2)

## 2019-02-15 LAB — RENAL FUNCTION PANEL
Albumin: 3.3 g/dL — ABNORMAL LOW (ref 3.5–5.0)
Anion gap: 16 — ABNORMAL HIGH (ref 5–15)
BUN: 16 mg/dL (ref 8–23)
CO2: 25 mmol/L (ref 22–32)
Calcium: 9.3 mg/dL (ref 8.9–10.3)
Chloride: 94 mmol/L — ABNORMAL LOW (ref 98–111)
Creatinine, Ser: 4.04 mg/dL — ABNORMAL HIGH (ref 0.61–1.24)
GFR calc Af Amer: 16 mL/min — ABNORMAL LOW (ref >60)
GFR calc non Af Amer: 14 mL/min — ABNORMAL LOW (ref >60)
Glucose, Bld: 103 mg/dL — ABNORMAL HIGH (ref 70–99)
Phosphorus: 2.5 mg/dL (ref 2.5–4.6)
Potassium: 3.7 mmol/L (ref 3.5–5.1)
Sodium: 135 mmol/L (ref 135–145)

## 2019-02-15 LAB — MRSA PCR SCREENING: MRSA by PCR: POSITIVE — AB

## 2019-02-15 LAB — CULTURE, BLOOD (ROUTINE X 2)
Culture: NO GROWTH
Culture: NO GROWTH
Special Requests: ADEQUATE

## 2019-02-15 LAB — GLUCOSE, CAPILLARY
Glucose-Capillary: 114 mg/dL — ABNORMAL HIGH (ref 70–99)
Glucose-Capillary: 125 mg/dL — ABNORMAL HIGH (ref 70–99)
Glucose-Capillary: 87 mg/dL (ref 70–99)
Glucose-Capillary: 100 mg/dL — ABNORMAL HIGH (ref 70–99)
Glucose-Capillary: 111 mg/dL — ABNORMAL HIGH (ref 70–99)

## 2019-02-15 LAB — VANCOMYCIN, RANDOM: Vancomycin Rm: 30

## 2019-02-15 LAB — PROTIME-INR
INR: 2.1 — ABNORMAL HIGH (ref 0.8–1.2)
Prothrombin Time: 23.1 seconds — ABNORMAL HIGH (ref 11.4–15.2)

## 2019-02-15 MED ORDER — VANCOMYCIN HCL IN DEXTROSE 1-5 GM/200ML-% IV SOLN
1000.0000 mg | INTRAVENOUS | Status: DC
  Administered 2019-02-15: 1000 mg via INTRAVENOUS
  Filled 2019-02-15 (×2): qty 200

## 2019-02-15 MED ORDER — DARBEPOETIN ALFA 60 MCG/0.3ML IJ SOSY: 60.0000 ug | PREFILLED_SYRINGE | INTRAMUSCULAR | Status: DC

## 2019-02-15 MED ORDER — MIDODRINE HCL 5 MG PO TABS
ORAL_TABLET | ORAL | Status: AC
  Administered 2019-02-15: 5 mg via ORAL
  Filled 2019-02-15: qty 1

## 2019-02-15 MED ORDER — ALBUMIN HUMAN 25 % IV SOLN
INTRAVENOUS | Status: AC
  Filled 2019-02-15: qty 50

## 2019-02-15 MED ORDER — DARBEPOETIN ALFA 60 MCG/0.3ML IJ SOSY
PREFILLED_SYRINGE | INTRAMUSCULAR | Status: AC
  Administered 2019-02-15: 60 ug
  Filled 2019-02-15: qty 0.3

## 2019-02-15 MED ORDER — ASPIRIN EC 81 MG PO TBEC
81.0000 mg | DELAYED_RELEASE_TABLET | Freq: Every day | ORAL | Status: DC
  Administered 2019-02-16 – 2019-02-20 (×5): 81 mg via ORAL
  Filled 2019-02-15 (×5): qty 1

## 2019-02-15 MED ORDER — VANCOMYCIN HCL IN DEXTROSE 1-5 GM/200ML-% IV SOLN
INTRAVENOUS | Status: AC
  Filled 2019-02-15: qty 200

## 2019-02-15 MED ORDER — VITAMIN K1 10 MG/ML IJ SOLN
2.5000 mg | INTRAVENOUS | Status: AC
  Administered 2019-02-15: 16:00:00 2.5 mg via INTRAVENOUS
  Filled 2019-02-15: qty 0.25

## 2019-02-15 MED ORDER — ALBUMIN HUMAN 25 % IV SOLN
INTRAVENOUS | Status: AC
  Administered 2019-02-15: 25 g
  Filled 2019-02-15: qty 50

## 2019-02-15 NOTE — Progress Notes (Signed)
[facility] Progress Note   Subjective:   Patient seen on HD. Resting comfortable. Reports breathing is much better today. Still with slight cough. Denies CP, abdominal pain, N/V/D. Ongoing leg pain. Feels his swelling has improved. BP 92/42 on HD. Echocardiogram completed yesterday shows RV size increased with worsening function and RA is massively dilated.   Objective Vitals:   02/14/19 1835 02/14/19 2038 02/15/19 0532 02/15/19 0612  BP: (!) 99/48 (!) 101/58 (!) 95/56   Pulse: (!) 56 89 (!) 106 98  Resp: 18 17 16    Temp: 98 F (36.7 C) 98.6 F (37 C) 98 F (36.7 C)   TempSrc: Oral Oral    SpO2: 99% 97% 98%   Weight: 117 kg     Height:       Physical Exam General:Well developed male, alert and in NAD. O2 via nasal cannula.  Heart:RRR, no murmurs, rubs or gallops Lungs:Decreased breath sounds bilateral bases. No wheezing, rhonchi or rales appreciated. Exam limited due to patient positioning.  Abdomen:Soft, non-tender. +BS Extremities:1+ edema bilateral lower extremities. LLE wrapped Dialysis Access:RUE AVF cannulated.   Additional Objective Labs: Basic Metabolic Panel: Recent Labs  Lab 02/12/19 0257 02/13/19 0233 02/14/19 0130  NA 130* 134* 135  K 3.7 3.7 3.6  CL 90* 94* 94*  CO2 21* 22 26  GLUCOSE 126* 118* 123*  BUN 88* 47* 25*  CREATININE 11.17* 7.94* 5.94*  CALCIUM 8.9 9.0 9.4  PHOS 4.0 3.0 2.8   Liver Function Tests: Recent Labs  Lab 01/27/2019 1000 02/12/19 0257 02/13/19 0233 02/14/19 0130  AST 30  --   --   --   ALT 18  --   --   --   ALKPHOS 269*  --   --   --   BILITOT 1.2  --   --   --   PROT 7.9  --   --   --   ALBUMIN 3.3* 2.7* 2.7* 2.9*   No results for input(s): LIPASE, AMYLASE in the last 168 hours. CBC: Recent Labs  Lab 01/20/2019 1000 02/11/19 0325 02/12/19 0257 02/13/19 0233 02/14/19 0130  WBC 15.7* 13.5* 13.4* 11.8* 16.7*  NEUTROABS 13.6*  --   --   --   --   HGB 10.0* 9.3* 9.6* 9.6* 9.3*  HCT 31.5* 28.5*  29.5* 29.9* 29.5*  MCV 96.9 94.4 92.8 95.8 97.0  PLT 180 177 193 217 247   Blood Culture    Component Value Date/Time   SDES  01/30/2019 0950    BLOOD RIGHT HAND Performed at [facility], [address] [place], [place] [address]    [facility]  02/04/2019 0950    BOTTLES DRAWN AEROBIC AND ANAEROBIC Blood Culture results may not be optimal due to an inadequate volume of blood received in culture bottles Performed at [facility], [address], [place], [place] [address]    CULT  02/01/2019 0950    NO GROWTH 4 DAYS Performed at [facility], [place] [address] [place], [place] [address]    REPTSTATUS PENDING 02/12/2019 0950    Cardiac Enzymes: Recent Labs  Lab 01/30/2019 1000  CKTOTAL 198   CBG: Recent Labs  Lab 02/14/19 1217 02/14/19 2059 02/14/19 2337 02/15/19 0634 02/15/19 0637  GLUCAP 114* 112* 100* 125* 114*   Studies/Results: Dg Chest 2 View  Result Date: 02/13/2019 CLINICAL DATA:  Shortness of breath EXAM: CHEST - 2 VIEW COMPARISON:  02/06/2019 FINDINGS: Moderate cardiomegaly, unchanged. Calcific thoracic  aorta. Mildly prominent perihilar interstitial markings. No new focal airspace consolidation. No large pleural fluid collection. No pneumothorax. IMPRESSION: Cardiomegaly with mild perihilar interstitial opacities suggesting CHF/fluid overload. Electronically Signed   By: Davina Poke M.D.   On: 02/13/2019 12:56   Medications: . albumin human    . albumin human    . phytonadione (VITAMIN K) IV    . piperacillin-tazobactam (ZOSYN)  IV Stopped (02/15/19 0205)  . vancomycin 1,000 mg (02/14/19 1725)   . atorvastatin  10 mg Oral Daily  . Chlorhexidine Gluconate Cloth  6 each Topical Q0600  . Chlorhexidine Gluconate Cloth  6 each Topical Q0600  . darbepoetin (ARANESP) injection - DIALYSIS  60 mcg Intravenous Q Tue-HD  . feeding supplement (PRO-STAT SUGAR FREE 64)  30 mL Oral BID  . FLUoxetine  20 mg Oral Daily  . insulin aspart   0-15 Units Subcutaneous TID WC  . levETIRAcetam  250 mg Oral Q T,Th,Sat-1800  . midodrine  10 mg Oral Q M,W,F-HD  . midodrine  5 mg Oral BID WC  . multivitamin  1 tablet Oral QHS  . pantoprazole  40 mg Oral Daily  . ticagrelor  90 mg Oral BID  . vitamin B-12  1,000 mcg Oral Daily    Dialysis Orders: Center:[facility] MWF. 2K/ 2.25 Ca; EDW 123.5kg (last post weight +3.4kg); LUE AVF Heparin 2000 unit bolus Mircera 122mcg IV q 2 weeks  Renvela 800mg  3 tabs PO TID  Assessment/Plan: 1. Shortness of breath: Chest x-ray 7/30 revealed cardiomegaly with mild perihilar interstitial opacities suggestingCHF/fluid overload.  Improved with serial dialysis past 3 days, now on HD back on regular MWF schedule. Still with some edema and on O2. UFG 3L today. Echo yesterday also showed significant R heart failure and EF 35-40%. Per primary- may need cardiology consult.   2. Cellulitis:Left lower extremity, seen by ortho.MRI completed without osteomyelitis, plan is for compression per ortho.WBC elevated. On vancomycin and zosyn. Per primary/ortho.  3. ESRD:Normally dialyzes MWF at [facility], dialyzed 7/28, 7/29, 7/30  for volume, now back on MWF schedule. Reports SOB has improved. K+ 3.6, continue 3K bath. Will resume regular MWF schedule with next HD planned for 8/3.  4. Bleeding from CVE:[id number] bleeding after dialysis on 7/28. Heparin held with HD and no prolonged bleeding reported yesterday. IR has been consulted for fistulogram but could not complete on 7/30 due to elevated WBC and INR. Tentatively planning for today but no INR reported yet. Holding heparin drip with dialysis.  5. Hypotension:Blood pressure chronically low, has received albumin for blood pressure support during dialysis. On midodrine.Tolerating HD well so far today with BP 92/42. Continue midodrine and albumin.  6. Anemia:Hemoglobin 9.3.Continuearanesp 75mcg IV q week with dialysis. 7. Metabolic bone  WCHENID:[id number]. Last outpatient PTH 241. Phos 2.8, will hold binders for now.  8. Nutrition:Albumin 2.9. Continue supplements.  9. A. Fib on coumadin: INR subtherapeutic on admission, currently on heparin. Per primary.  10. T2DM: Currently in SSI, per primary. 11.  Seizure disorder:On Keppra    Anice Paganini, PA-C 02/15/2019, 7:59 AM  [employer] Pager: [phone]

## 2019-02-15 NOTE — Consult Note (Addendum)
Cardiology Consultation:   Patient ID: Bruce Cole MRN: [id number]; DOB: July 29, 1940  Admit date: 02/13/2019 Date of Consult: 02/15/2019  Primary Care Provider: Debbrah Alar, NP Primary Cardiologist: Glori Bickers, MD  Primary Electrophysiologist:  None    Patient Profile:   Bruce Cole is a 78 y.o. male with a hx of ESRDonMWFHD,chronic hypotension on Midodrin, Hyperlipidemia, DM2, A. fib on Coumadin;chronic zoster on Valtrex; rheumatoid arthritis on Plaquenil, chronic combined CHF (EF 35-40%, severely dilated right atrium 02/14/19), and recent diagnosis of of seizure presenting with a fall (on Keppra) who is being seen today for the evaluation of acute heart failure at the request of Dr. Earnest Conroy.  Patient followed with Dr. Haroldine Laws and heart failure clinic.  Chronic systolic HF with prominent RV failure due to NICM: Echo 7/17 EF 35-40%, moderate AS, left atrium was moderately dilated, right atrium was severely dilated, PA peak pressure 59 mmHG.  RHC/LHC in 8/14 with no CAD, suggestive of restrictive physiology, and pulmonary venous hypertension (low PVR). Afib on coumadin with chronic bradycardia.   Patient was last seen by Dr. Haroldine Laws 10/17/2016 for hospital follow-up. Patient had been admitted twice in 2017 for acute CHF complicated by AKI and had had many fluctuating weights. Baseline 228. BP med were stopped due to drops during HD. Volume was controlled with HD.   History of Present Illness:   Patient was admitted 5/24 -5/28 to the [facility] for fall/possible seizures. Patient had fallen on his left hip/leg. He was started on Keppra per neurology. He was discharged to [facility] rehab and he was discharged 6/17 with home health.      Mr. Halladay presented to the ED 7/26 after a fall due to generalized weakness. He was trying to transfer himself off the commode onto his walker when he slid down to the floor. He denied hitting his head or LOC. Patient had HD  the day before. He was on the floor for 5 ours until a caregiver found him. Patient lives alone but has a nurse coming every morning and has his son/grandchildren who check on him as well.   In the ED he was found to be fluid overloaded with lower extremity edema. T-max 85F, WBC of 15,000 new evidence of cellulitis on his right inner thigh and left posterior calf with foul-smelling discharge. He also had significant lower extremity edema. Daughter noted that in the last week or so they have started weaning his Keppra because his EEG had been normal. B/P 105/60, HR 58. Patient with history of chronic hypotension (baseline 90s to 110s on Midodrin) and chronic bradycardia. Patient was admitted on Vanc/Zosyn.   Blood pressures remained 80 to 100. MRI obtained on 7/27 showed extensive subcutaneous edema /mild intramuscular edema of the LLE concerning for cellulitis/myositis.  There was no evidence of necrotizing fasciitis or abscess or osteomyelitis. Seen by orthopedics and underwent ABI which indicate peripheral vascular disease, now has venous compression wrap of left lower extremity.  7/29 patient complained of dyspnea and CXR was suggestive of Pulmonary edema. Also noted patient had asymmetrical leg edema. Cardiac meds had been discontinued for hypotension.    Patient also followed by nephrology and receiving HD MWF.  Nephrology plan for fistulogram due to prolonged bleeding from AV fistula postdialysis. He is currently on heparin. Coumadin on hold.   Recent labs: K+ 3.7, Hgb 9.2, Platelets 254. WBC 16.7>14.8, INR 2.0  Heart Pathway Score:     Past Medical History:  Diagnosis Date   Acute blood loss anemia  Atrial fibrillation (HCC)    Chronic   Chronic combined systolic and diastolic heart failure (HCC)    Colon polyp 2000   Dysrhythmia    hx   ESRD on hemodialysis ([place])    Hemo TTHS- [place]   Essential hypertension    Gastritis and gastroduodenitis    with bleeding   Gout     Heart murmur    Herpes    History of blood transfusion    Nephrolithiasis    OSA (obstructive sleep apnea) 09/02/2013    IMPRESSION :  1. Mild obstructive sleep apnea with hypopneas causing sleep fragmentation and moderate oxygen desaturation.  2. Short runs of nonsustained VT were noted. His beta blocker may need to be titrated 3. Significant PLM's were noted, the PLM arousal index was low. Please correlate with clinical history of restless leg syndrome.  4. Sleep efficiency was poor.  RECOMMENDATION:  1. Treatment options for this degree of sleep disordered breathing include weight loss and positional therapy to avoid supine sleep 2. Consider titrating beta blocker further, defer to cardiologist 3. Patient should be cautioned against driving when sleepy.They should be asked to avoid medications with sedative side effects      Osteoarthritis of right hip    Pneumonia    hx 30 yrs ago   Primary osteoarthritis of right hip    Rheumatoid arthritis ([place])    Seizures ([place])    Thrombocytopenia ([place])     Past Surgical History:  Procedure Laterality Date   AV FISTULA PLACEMENT Left 08/26/2015   Procedure: LEFT RADIOCEPHALIC FISTULA CREATION;  Surgeon: Rosetta Posner, MD;  Location: [place];  Service: Vascular;  Laterality: Left;   AV FISTULA PLACEMENT Left 11/23/2015   Procedure: ARTERIOVENOUS (AV) FISTULA CREATION;  Surgeon: Rosetta Posner, MD;  Location: [place];  Service: Vascular;  Laterality: Left;   BACK SURGERY     x2- discectomy   CHOLECYSTECTOMY  1994   CYSTOSCOPY/RETROGRADE/URETEROSCOPY/STONE EXTRACTION WITH BASKET     ESOPHAGOGASTRODUODENOSCOPY N/A 11/13/2015   Procedure: ESOPHAGOGASTRODUODENOSCOPY (EGD);  Surgeon: Irene Shipper, MD;  Location: [facility] ENDOSCOPY;  Service: Endoscopy;  Laterality: N/A;   INSERTION OF DIALYSIS CATHETER Left 11/23/2015   Procedure: INSERTION OF DIALYSIS CATHETER;  Surgeon: Rosetta Posner, MD;  Location: [place];  Service: Vascular;  Laterality: Left;     IR GENERIC HISTORICAL Left 08/04/2016   IR DIALY SHUNT INTRO NEEDLE/INTRACATH INITIAL W/IMG LEFT 08/04/2016 Markus Daft, MD [facility]-INTERV RAD   IR GENERIC HISTORICAL  08/04/2016   IR US GUIDE VASC ACCESS LEFT 08/04/2016 Markus Daft, MD [facility]INTER[facility]   JOINT REPLACEMENT     Total L-Hip replacement, Right Knee 10/20/09   LEFT AND RIGHT HEART CATHETERIZATION WITH CORONARY ANGIOGRAM N/A 02/22/2013   Procedure: LEFT AND RIGHT HEART CATHETERIZATION WITH CORONARY ANGIOGRAM;  Surgeon: Jolaine Artist, MD;  Location: [facility] CATH LAB;  Service: Cardiovascular;  Laterality: N/A;   LITHOTRIPSY  90's   PERIPHERAL VASCULAR CATHETERIZATION Left 11/19/2015   Procedure: A/V/Fistulagram;  Surgeon: Conrad Lajas, MD;  Location: [place] CV LAB;  Service: Cardiovascular;  Laterality: Left;   PERIPHERAL VASCULAR CATHETERIZATION Left 07/21/2016   Procedure: A/V Fistulagram;  Surgeon: Conrad Sheldon, MD;  Location: [place] CV LAB;  Service: Cardiovascular;  Laterality: Left;  arm   REVISON OF ARTERIOVENOUS FISTULA Left 05/30/2016   Procedure: REVISION LEFT UPPER ARM FISTULA;  Surgeon: Conrad Lynch, MD;  Location: [place];  Service: Vascular;  Laterality: Left;   REVISON OF  ARTERIOVENOUS FISTULA Left 07/22/2016   Procedure: REVISON OF BASILIC VEIN TRANSPOSITION ANASTOMOSIS;  Surgeon: Rosetta Posner, MD;  Location: [place];  Service: Vascular;  Laterality: Left;   SPINE SURGERY     x 2   TOTAL HIP ARTHROPLASTY Right 03/22/2016   Procedure: RIGHT TOTAL HIP ARTHROPLASTY ANTERIOR APPROACH;  Surgeon: Mcarthur Rossetti, MD;  Location: [place];  Service: Orthopedics;  Laterality: Right;     Home Medications:  Prior to Admission medications   Medication Sig Start Date End Date Taking? Authorizing Provider  albuterol (VENTOLIN HFA) 108 (90 Base) MCG/ACT inhaler Inhale 1 puff into the lungs every 4 (four) hours as needed for wheezing or shortness of breath.   Yes [provider]  atorvastatin (LIPITOR) 10 MG tablet TAKE 1  TABLET(10 MG) BY MOUTH DAILY Patient taking differently: Take 10 mg by mouth daily.  12/12/18  Yes Debbrah Alar, NP  B Complex-C-Folic Acid (RENA-VITE RX) 1 MG TABS Take 1 tablet by mouth at bedtime.  02/18/16  Yes [provider]  colchicine 0.6 MG tablet Take 1/2 tablet by mouth once as needed for gout flare. Call if no improvement 01/02/18  Yes Debbrah Alar, NP  dorzolamide (TRUSOPT) 2 % ophthalmic solution Place 1 drop into both eyes 2 (two) times daily.   Yes [provider]  FLUoxetine (PROZAC) 20 MG capsule Take 20 mg by mouth daily.   Yes [provider]  hydroxychloroquine (PLAQUENIL) 200 MG tablet Take 200 mg by mouth daily.   Yes [provider]  hydrOXYzine (ATARAX/VISTARIL) 25 MG tablet Take 1 tablet (25 mg total) by mouth every 8 (eight) hours as needed for itching. 12/13/18  Yes Mikhail, Velta Addison, DO  insulin detemir (LEVEMIR) 100 UNIT/ML injection Inject 15 Units into the skin daily.   Yes [provider]  latanoprost (XALATAN) 0.005 % ophthalmic solution Place 1 drop into both eyes at bedtime.   Yes [provider]  levETIRAcetam (KEPPRA) 500 MG tablet Take 1 tablet by mouth once daily after dialysis (only take on dialysis days) 01/10/19  Yes Debbrah Alar, NP  liraglutide (VICTOZA) 18 MG/3ML SOPN Inject 0.6 mg into the skin daily.   Yes [provider]  midodrine (PROAMATINE) 10 MG tablet Take 1 tablet (10 mg total) by mouth every Monday, Wednesday, and Friday with hemodialysis. 12/13/18  Yes Mikhail, Velta Addison, DO  midodrine (PROAMATINE) 5 MG tablet Take 1 tablet (5 mg total) by mouth 2 (two) times daily with a meal. 12/13/18  Yes Mikhail, Velta Addison, DO  omeprazole (PRILOSEC OTC) 20 MG tablet Take 20 mg by mouth daily as needed (heartburn).   Yes [provider]  pantoprazole (PROTONIX) 40 MG tablet Take 1 tablet (40 mg total) by mouth daily. Patient taking differently: Take 20 mg by mouth daily.   02/07/19  Yes Debbrah Alar, NP  RENVELA 800 MG tablet Take 3 tablets (2,400 mg total) by mouth 3 (three) times daily with meals. 07/23/16  Yes Lavina Hamman, MD  SANTYL ointment Apply 1 application topically daily. Patient taking differently: Apply 1 application topically daily as needed (wound care).  01/17/19 02/16/19 Yes Debbrah Alar, NP  ticagrelor (BRILINTA) 90 MG TABS tablet Take 90 mg by mouth 2 (two) times daily. As directed   Yes [provider]  vitamin B-12 (CYANOCOBALAMIN) 1000 MCG tablet Take 1,000 mcg by mouth daily.   Yes [provider]  warfarin (COUMADIN) 5 MG tablet Take 5 mg by mouth daily.  12/31/18  Yes [provider]  ferric citrate (AURYXIA) 1 GM 210 MG(Fe) tablet Take 2 tablets (420 mg total) by mouth 3 (three) times daily with meals. Patient not taking: Reported on 02/11/2019 12/13/18   Cristal Ford, DO  Nutritional Supplements (FEEDING SUPPLEMENT, NEPRO CARB STEADY,) LIQD Take 237 mLs by mouth 3 (three) times daily as needed (Supplement). Patient not taking: Reported on 01/08/2019 12/13/18   Cristal Ford, DO    Inpatient Medications: Scheduled Meds:  atorvastatin  10 mg Oral Daily   Chlorhexidine Gluconate Cloth  6 each Topical Q0600   [START ON 02/24/19] darbepoetin (ARANESP) injection - DIALYSIS  60 mcg Intravenous Q Wed-HD   feeding supplement (PRO-STAT SUGAR FREE 64)  30 mL Oral BID   FLUoxetine  20 mg Oral Daily   insulin aspart  0-15 Units Subcutaneous TID WC   levETIRAcetam  250 mg Oral Q T,Th,Sat-1800   midodrine  10 mg Oral Q M,W,F-HD   midodrine  5 mg Oral BID WC   multivitamin  1 tablet Oral QHS   pantoprazole  40 mg Oral Daily   ticagrelor  90 mg Oral BID   vitamin B-12  1,000 mcg Oral Daily   Continuous Infusions:  albumin human     albumin human     piperacillin-tazobactam (ZOSYN)  IV Stopped (02/15/19 0205)   vancomycin 1,000 mg (02/15/19 0944)   PRN Meds: acetaminophen **OR**  acetaminophen, albumin human, calcium carbonate (dosed in mg elemental calcium), docusate sodium, feeding supplement (NEPRO CARB STEADY), hydrOXYzine, ondansetron **OR** ondansetron (ZOFRAN) IV, sorbitol, zolpidem  Allergies:    Allergies  Allergen Reactions   Neurontin [Gabapentin] Other (See Comments)    Encephalopathy with myoclonic jerking   Ace Inhibitors Other (See Comments)    Worsening renal insufficiency    Social History:   Social History   Socioeconomic History   Marital status: Single    Spouse name: Not on file   Number of children: 3   Years of education: Not on file   Highest education level: Not on file  Occupational History   Occupation: retired Personnel officer: RETIRED  Social Designer, fashion/clothing strain: Not on file   Food insecurity    Worry: Not on file    Inability: Not on file   Transportation needs    Medical: Not on file    Non-medical: Not on file  Tobacco Use   Smoking status: Never Smoker   Smokeless tobacco: Never Used  Substance and Sexual Activity   Alcohol use: No    Comment: occasional   Drug use: No   Sexual activity: Not on file  Lifestyle   Physical activity    Days per week: Not on file    Minutes per session: Not on file   Stress: Not on file  Relationships   Social connections    Talks on phone: Not on file    Gets together: Not on file    Attends religious service: Not on file    Active member of club or organization: Not on file    Attends meetings of clubs or organizations: Not on file    Relationship status: Not on file   Intimate partner violence    Fear of current or ex partner: Not on file    Emotionally abused: Not on file    Physically abused: Not on file    Forced sexual activity: Not on file  Other Topics Concern   Not on file  Social History Narrative  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 11/25/15   Widowed   Never smoked   FULL  CODE   Right handed    2 level home stays on 1st level     Family History:    Family History  Problem Relation Age of Onset   Hypertension Mother    Arthritis Mother        ?RA   Hypertension Father      ROS:  Please see the history of present illness.  All other ROS reviewed and negative.     Physical Exam/Data:   Vitals:   02/15/19 0830 02/15/19 0845 02/15/19 0900 02/15/19 0930  BP: (!) 90/57 (!) 91/56 (!) 88/55 97/62  Pulse: 70 61 (!) 54 76  Resp:      Temp:      TempSrc:      SpO2:      Weight:      Height:        Intake/Output Summary (Last 24 hours) at 02/15/2019 1025 Last data filed at 02/15/2019 0400 Gross per 24 hour  Intake 1040 ml  Output 3000 ml  Net -1960 ml   Last 3 Weights 02/15/2019 02/14/2019 02/14/2019  Weight (lbs) 264 lb 8.8 oz 257 lb 15 oz 264 lb 8.8 oz  Weight (kg) 120 kg 117 kg 120 kg     Body mass index is 33.07 kg/m.  General: Well developed AAM; mildly labored breathing HEENT: normal Neck: no JVD Endocrine:  No thryomegaly Vascular: No carotid bruits; FA pulses 2+ bilaterally without bruits  Cardiac:  normal S1, S2; Irreg Irreg; no murmur  Lungs:  clear to auscultation bilaterally, no wheezing, rhonchi or rales  Abd: soft, nontender, no hepatomegaly  Ext: no edema; Right lower leg wrapped Musculoskeletal:  No deformities Skin: warm and dry  Neuro:  CNs 2-12 intact, no focal abnormalities noted Psych:  Normal affect   EKG:  The EKG was personally reviewed and demonstrates:  7/26 Afib, 58 bpm, LAD Telemetry:  Telemetry was personally reviewed and demonstrates:  Not on telemetry  Relevant CV Studies:  Echo 02/14/19  IMPRESSIONS   1. The left ventricle has moderately reduced systolic function, with an ejection fraction of 35-40%. The cavity size was normal. There is right ventricular volume and pressure overload.  2. The right ventricle has severely reduced systolic function. The cavity was severely enlarged. There is no increase  in right ventricular wall thickness. Right ventricular systolic pressure Calculated RVSP likely underestimates true volume/pressure  load due to rapid equalization of pressure with an estimated pressure of 33.1 mmHg.  3. Right atrial size was severely dilated.  4. The tricuspid valve was abnormal. Tricuspid valve regurgitation is severe.  5. Incomplete coaptation of tricuspid valve leaflets, with rapid equalization of pressure. Systolic reversals in hepatic vein.  6. The aortic valve is tricuspid Moderate calcification of the aortic valve. Not able to assess stenosis due to suboptimal Doppler alignment of the aortic valve.  7. The inferior vena cava was dilated in size with <50% respiratory variability.  8. When compared to the prior study: 01/22/2016 - compared to prior study, RV size has increased and RV function has worsened. RA is massively dilated. Flattening of the ventricular septum throughout the cardiac cycle, with prominent shift in diastole.  The tricuspid annulus has dilated and there is incomplete coaptation of the TV leaflets, with severe TR and rapid equalization of pressure. LV function appears similar. Side by side comparison of  images performed.  Laboratory Data:  High Sensitivity Troponin:  No results for input(s): TROPONINIHS in the last 720 hours.   Cardiac EnzymesNo results for input(s): TROPONINI in the last 168 hours. No results for input(s): TROPIPOC in the last 168 hours.  Chemistry Recent Labs  Lab 02/13/19 0233 02/14/19 0130 02/15/19 0854  NA 134* 135 135  K 3.7 3.6 3.7  CL 94* 94* 94*  CO2 22 26 25   GLUCOSE 118* 123* 103*  BUN 47* 25* 16  CREATININE 7.94* 5.94* 4.04*  CALCIUM 9.0 9.4 9.3  GFRNONAA 6* 9* 14*  GFRAA 7* 10* 16*  ANIONGAP 18* 15 16*    Recent Labs  Lab 01/25/2019 1000  02/13/19 0233 02/14/19 0130 02/15/19 0854  PROT 7.9  --   --   --   --   ALBUMIN 3.3*   < > 2.7* 2.9* 3.3*  AST 30  --   --   --   --   ALT 18  --   --   --   --   ALKPHOS  269*  --   --   --   --   BILITOT 1.2  --   --   --   --    < > = values in this interval not displayed.   Hematology Recent Labs  Lab 02/13/19 0233 02/14/19 0130 02/15/19 0500  WBC 11.8* 16.7* 14.8*  RBC 3.12* 3.04* 3.00*  HGB 9.6* 9.3* 9.2*  HCT 29.9* 29.5* 29.4*  MCV 95.8 97.0 98.0  MCH 30.8 30.6 30.7  MCHC 32.1 31.5 31.3  RDW 19.8* 19.9* 19.8*  PLT 217 247 254   BNPNo results for input(s): BNP, PROBNP in the last 168 hours.  DDimer No results for input(s): DDIMER in the last 168 hours.   Radiology/Studies:  Dg Chest 2 View  Result Date: 02/13/2019 CLINICAL DATA:  Shortness of breath EXAM: CHEST - 2 VIEW COMPARISON:  01/30/2019 FINDINGS: Moderate cardiomegaly, unchanged. Calcific thoracic aorta. Mildly prominent perihilar interstitial markings. No new focal airspace consolidation. No large pleural fluid collection. No pneumothorax. IMPRESSION: Cardiomegaly with mild perihilar interstitial opacities suggesting CHF/fluid overload. Electronically Signed   By: Davina Poke M.D.   On: 02/13/2019 12:56   Mr Tibia Fibula Left Wo Contrast  Result Date: 02/11/2019 CLINICAL DATA:  Left lower extremity redness, swelling EXAM: MRI OF LOWER LEFT EXTREMITY WITHOUT CONTRAST TECHNIQUE: Multiplanar, multisequence MR imaging of the left lower extremity from the proximal tibial metaphysis to the hindfoot was performed. No intravenous contrast was administered. COMPARISON:  None. FINDINGS: Bones/Joint/Cartilage No acute fracture. No malalignment. No abnormal cortical signal or rotation. No bone marrow edema. Mild degenerative changes of the tibiotalar and subtalar joints. No focal talar dome osteochondral lesion. Ligaments Grossly intact, suboptimally evaluated. Muscles and Tendons There is extensive atrophy and fatty infiltration throughout the lower leg musculature. Mild edema-like intramuscular signal most pronounced within the flexor hallucis longus and soleus muscles. No intramuscular fluid  collections. Mild thickening and intermediate signal within the distal left Achilles tendon without tear. Remaining ankle tendons appear grossly intact. No significant tenosynovial fluid. Soft tissues Near circumferential subcutaneous edema, most pronounced over the anteromedial aspect of the distal tibial metadiaphysis. No well-defined or drainable fluid collections. No significant deep fascial edema. The included portions of the contralateral right lower extremity reveals similar findings. IMPRESSION: 1. Extensive subcutaneous edema and mild intramuscular edema of the left lower extremity suggesting a nonspecific cellulitis and myositis. No well-defined or drainable fluid collections. No significant deep fascial  edema. 2. No marrow signal abnormality to suggest acute osteomyelitis. 3. Similar findings are identified within the visualized portion of the contralateral right lower extremity. 4. Left Achilles tendinosis without tear. Electronically Signed   By: Davina Poke M.D.   On: 02/11/2019 16:54   Vas [place] Burnard Bunting With/wo Tbi  Result Date: 02/12/2019 LOWER EXTREMITY DOPPLER STUDY Indications: Gangrene. High Risk Factors: Hypertension, Diabetes.  Limitations: patient movement Comparison Study: No prior studies. Performing Technologist: Carlos Levering Rvt  Examination Guidelines: A complete evaluation includes at minimum, Doppler waveform signals and systolic blood pressure reading at the level of bilateral brachial, anterior tibial, and posterior tibial arteries, when vessel segments are accessible. Bilateral testing is considered an integral part of a complete examination. Photoelectric Plethysmograph (PPG) waveforms and toe systolic pressure readings are included as required and additional duplex testing as needed. Limited examinations for reoccurring indications may be performed as noted.  ABI Findings: +--------+------------------+-----+---------+--------+  Right    Rt Pressure (mmHg) Index Waveform  Comment    +--------+------------------+-----+---------+--------+  Brachial 102                      triphasic           +--------+------------------+-----+---------+--------+  PTA      102                1.00  biphasic            +--------+------------------+-----+---------+--------+  DP       143                1.40  biphasic            +--------+------------------+-----+---------+--------+ +---------+------------------+-----+--------+-------+  Left      Lt Pressure (mmHg) Index Waveform Comment  +---------+------------------+-----+--------+-------+  PTA       211                2.07  biphasic          +---------+------------------+-----+--------+-------+  DP        244                2.39  biphasic          +---------+------------------+-----+--------+-------+  Great Toe 53                 0.52                    +---------+------------------+-----+--------+-------+ +-------+-----------+-----------+------------+------------+  ABI/TBI Today's ABI Today's TBI Previous ABI Previous TBI  +-------+-----------+-----------+------------+------------+  Right   1.4                                                +-------+-----------+-----------+------------+------------+  Left    2.39        0.52                                   +-------+-----------+-----------+------------+------------+  Summary: Right: Resting right ankle-brachial index indicates noncompressible right lower extremity arteries. Unable to obtain TBI due to low amplitude waveforms. Left: Resting left ankle-brachial index indicates noncompressible left lower extremity arteries.The left toe-brachial index is abnormal.  *See table(s) above for measurements and observations.  Electronically signed by Deitra Mayo MD on 02/12/2019 at 5:52:38 PM.   Final  Assessment and Plan:   1. Shortness of Breath/Acute CHF -Patient admitted 7/26 for fall due to generalized weakness. Admitted on abx for cellulitis -7/29 patient developed dyspnea. CXR 7/30  revealedcardiomegaly with mild perihilar interstitial opacities suggestingCHF/fluid overload.  -Symptoms mildly improved with HD for the last 3 days -Echo 7/30 showed LVEF 35-40% and severely dilated Right atrium.  -Due to patient chronic hypotension will not likely pursue change in medical management. -Volume status controlled with HD -Patients SOB improved today -Of note: Unsure why Brilinta is on his current med list. Patient denies prior stents. Called Pharmacy and there is no prior prescription. Would recommend discontinuation of this medication (especially in the setting of bleeding fistula post-dialysis)  2. Cellulitis -On the lower left extremity, seen by Ortho -MRI with no sighns of osteomyelitis -Plan for compression per ortho -On abx -Per IM and Ortho  3. ESRD -HD MWF -SOB improved - K+ 3.6 -per Nephrology  4. Hypotension  -BP chronically low  -Received albumin during HD.  -On midodrine -Reading today 92/42  5. Afib on coumadin -INR 2.0 -Currently on heparin  6. Chronic Anemia -On Aranesp once weekly.  -Vitamin B supplements on hold  7. Hyperlipidemia -Lipitor -3/192019 LDL 51  8. DM 2 -SSI -per IM  For questions or updates, please contact [place] Please consult [email or web address] for contact info under   Signed, Cadence Ninfa Meeker, PA-C  02/15/2019 10:25 AM   I have personally seen and examined this patient with Cadence Kathlen Mody, PA-C. I agree with the assessment and plan as outlined above. Mr. Foss is known to have a non-ischemic cardiomyopathy. He has been followed in our Advanced Heart Failure clinic by Dr. Haroldine Laws. He is ESRD on HD. His fluid management is through HD. He does not make urine. He is now admitted after a fall due to generalized weakness and was volume overloaded with LE edema. His edema is resolved with HD. Dyspnea is improved post HD. He has chronic atrial fib on coumadin. EKG from 02/15/2019 with atrial fib, controlled rate. I  personally reviewed this EKG.  Cardiology consulted with question of clinical significance of right atrial enlargement.  Labs reviewed by me.  My exam:  General: Well developed, well nourished, NAD  HEENT: OP clear, mucus membranes moist  SKIN: warm, dry.  Neuro: No focal deficits  Musculoskeletal: Muscle strength 5/5 all ext  Psychiatric: Mood and affect normal  Neck: No JVD, no carotid bruits, no thyromegaly, no lymphadenopathy.  Lungs:Clear bilaterally, no wheezes, rhonci, crackles Cardiovascular: Regular rate and rhythm. No murmurs, gallops or rubs. Abdomen:Soft. Bowel sounds present.  Extremities: No lower extremity edema.   Plan:  1. Right atrial enlargement. He has had this since at least 2014. Likely no clinical significance in regards to his current presentation. This is most likely contributing to his chronic atrial fibrillation.  2. Acute systolic CHF: His fluid management is with HD. He does not tolerate beta blockers or Ace-inh/ARBs due to hypotension. He is much improved clinically following HD.  3. Atrial fib, persistent: He is on coumadin at home. On hold for now and on heparin. Rate is controlled per vitals. He is not on telemetry.   I cannot find a reason for Brilinta. He has had no recent stenting procedures. I will discuss with the primary team.   Lauree Chandler 02/15/2019 1:11 PM

## 2019-02-15 NOTE — Progress Notes (Signed)
Called patient's daughter, Bruce Cole, who lives in [place] and handles all his medical decisions. She said patient was discharged on Brilinta from the [place] center 6/17 and is unsure the reason. Also said patient has been regularly taking coumadin. Also confirmed by patient's outpatient pharmacy.   Onesti Bonfiglio Kathlen Mody, PA-C

## 2019-02-15 NOTE — Progress Notes (Signed)
PROGRESS NOTE    Bruce Cole  GDJ:[id number]  DOB: Mar 07, 1941  DOA: 02/09/2019 PCP: Debbrah Alar, NP  Brief Narrative:  78 y.o. male with medical history significant of ESRD on MWF HD,chronic hypotension on Midodrin,  A. fib on Coumadin; chronic zoster on Valtrex; rheumatoid arthritis on Plaquenil, chronic combined CHF who was admitted to this [facility] in May for falls/possible seizures and started on Keppra by neurology-> discharged to acute rehab-> home on 6/17 with home health now presents with generalized weakness, recurrent falls x3 over the last week. He presented to the ED after a fall ( slid down to the floor as he was feeling weak and could not unlock the walker while transitioning off commode).He stayed on the floor for ~5 hours until caregiver found him.Patient lives alone; has a nurse coming in in the mornings and son/grandchildren check on him in the evening. During his prior admission, he fell on his hip and was complaining about pain.  He was discharged to [facility] rehab and a scar developed on the back of his leg.He presented to [facility] where he was noted to have a significant area of necrosis on his left posterior calf with foul-smelling discharge and significant edema of most of the LLE.  Patient transferred here and admitted to Hospitalist's service with empiric antibiotics (Vanco/Zosyn) and orthopedics consulted.  MRI obtained on 7/27 showed extensive subcutaneous edema /mild intramuscular edema of the LLE concerning for cellulitis/myositis.  There was no evidence of necrotizing fasciitis or abscess or osteomyelitis.  Patient has also been seen by nephrology and receiving HD (changed to TTS as inpatient).  Hospital course has been complicated by prolonged bleeding from AV fistula postdialysis.  Patient planned for fistulogram and was initiated on heparin while Coumadin on hold.  Subjective:  Patient resting comfortably and talking on the phone.  Denies any  shortness of breath today.  Right lower extremity with compression wraps reinforced but foul-smelling discharge.    Objective: Vitals:   02/15/19 1030 02/15/19 1100 02/15/19 1114 02/15/19 1151  BP: 104/61 (!) 92/51 95/64 92/61   Pulse: 61 71 70 (!) 50  Resp:   16 18  Temp:   98.3 F (36.8 C) 97.7 F (36.5 C)  TempSrc:   Oral Oral  SpO2:   98% 99%  Weight:   118 kg   Height:        Intake/Output Summary (Last 24 hours) at 02/15/2019 1558 Last data filed at 02/15/2019 1500 Gross per 24 hour  Intake 822.01 ml  Output 3900 ml  Net -3077.99 ml   Filed Weights   02/14/19 1835 02/15/19 0715 02/15/19 1114  Weight: 117 kg 120 kg 118 kg    Physical Examination:  General exam: Appears awake, alert, calm and comfortable  Respiratory system: Clear to auscultation. Respiratory effort normal. Cardiovascular system: S1 & S2 heard, RRR. No JVD, murmurs, rubs, gallops or clicks. Gastrointestinal system: Abdomen is nondistended, soft and nontender. No organomegaly or masses felt. Normal bowel sounds heard. Central nervous system: Alert and oriented. No focal neurological deficits. Extremities: Symmetric 4 x 5 power. Skin: He has 2 stage 1 pressure ulcers on his right buttock.  He was noted to have a significant area of necrosis on his left posterior calf that is quite foul-smelling with surrounding erythema and somewhat taut edema of most of the LLE/now has compression dressing in place by orthopedics.  No evidence of redness noted currently and no RLE although on admission there was some evidence of milder trauma and  mild erythema without frank cellulitis(pictures in media) Psychiatry: Judgement and insight appear normal. Mood & affect appropriate.     Data Reviewed: I have personally reviewed following labs and imaging studies  CBC: Recent Labs  Lab 02/01/2019 1000 02/11/19 0325 02/12/19 0257 02/13/19 0233 02/14/19 0130 02/15/19 0500  WBC 15.7* 13.5* 13.4* 11.8* 16.7* 14.8*    NEUTROABS 13.6*  --   --   --   --   --   HGB 10.0* 9.3* 9.6* 9.6* 9.3* 9.2*  HCT 31.5* 28.5* 29.5* 29.9* 29.5* 29.4*  MCV 96.9 94.4 92.8 95.8 97.0 98.0  PLT 180 177 193 217 247 433   Basic Metabolic Panel: Recent Labs  Lab 02/11/19 0325 02/12/19 0257 02/13/19 0233 02/14/19 0130 02/15/19 0854  NA 131* 130* 134* 135 135  K 3.3* 3.7 3.7 3.6 3.7  CL 91* 90* 94* 94* 94*  CO2 26 21* 22 26 25   GLUCOSE 110* 126* 118* 123* 103*  BUN 71* 88* 47* 25* 16  CREATININE 9.65* 11.17* 7.94* 5.94* 4.04*  CALCIUM 8.7* 8.9 9.0 9.4 9.3  PHOS  --  4.0 3.0 2.8 2.5   GFR: Estimated Creatinine Clearance: 21.9 mL/min (A) (by C-G formula based on SCr of 4.04 mg/dL (H)). Liver Function Tests: Recent Labs  Lab 01/31/2019 1000 02/12/19 0257 02/13/19 0233 02/14/19 0130 02/15/19 0854  AST 30  --   --   --   --   ALT 18  --   --   --   --   ALKPHOS 269*  --   --   --   --   BILITOT 1.2  --   --   --   --   PROT 7.9  --   --   --   --   ALBUMIN 3.3* 2.7* 2.7* 2.9* 3.3*   No results for input(s): LIPASE, AMYLASE in the last 168 hours. No results for input(s): AMMONIA in the last 168 hours. Coagulation Profile: Recent Labs  Lab 02/03/2019 1046 02/12/19 0924 02/14/19 0130 02/15/19 1220  INR 1.5* 1.6* 2.0* 2.1*   Cardiac Enzymes: Recent Labs  Lab 02/15/2019 1000  CKTOTAL 198   BNP (last 3 results) No results for input(s): PROBNP in the last 8760 hours. HbA1C: No results for input(s): HGBA1C in the last 72 hours. CBG: Recent Labs  Lab 02/14/19 2059 02/14/19 2337 02/15/19 0634 02/15/19 0637 02/15/19 1310  GLUCAP 112* 100* 125* 114* 87   Lipid Profile: No results for input(s): CHOL, HDL, LDLCALC, TRIG, CHOLHDL, LDLDIRECT in the last 72 hours. Thyroid Function Tests: No results for input(s): TSH, T4TOTAL, FREET4, T3FREE, THYROIDAB in the last 72 hours. Anemia Panel: No results for input(s): VITAMINB12, FOLATE, FERRITIN, TIBC, IRON, RETICCTPCT in the last 72 hours. Sepsis Labs: Recent  Labs  Lab 01/30/2019 1000  LATICACIDVEN 2.0*    Recent Results (from the past 240 hour(s))  Blood Culture (routine x 2)     Status: None   Collection Time: 02/08/2019  9:30 AM   Specimen: BLOOD RIGHT ARM  Result Value Ref Range Status   Specimen Description   Final    BLOOD RIGHT ARM Performed at [facility], [address] [place], [place] [address]    Special Requests   Final    BOTTLES DRAWN AEROBIC AND ANAEROBIC Blood Culture adequate volume Performed at [facility], [address] [place], [place] [address]    Culture   Final    NO GROWTH 5 DAYS Performed  at [facility], [place] [address] [place], [place] [address]    Report Status 02/15/2019 FINAL  Final  Blood Culture (routine x 2)     Status: None   Collection Time: 02/11/2019  9:50 AM   Specimen: BLOOD RIGHT HAND  Result Value Ref Range Status   Specimen Description   Final    BLOOD RIGHT HAND Performed at [facility], [address] [place], [place] [address]    Special Requests   Final    BOTTLES DRAWN AEROBIC AND ANAEROBIC Blood Culture results may not be optimal due to an inadequate volume of blood received in culture bottles Performed at [facility], [address] [place], [place] [address]    Culture   Final    NO GROWTH 5 DAYS Performed at [facility], [place] [address] [place],  [address]    Report Status 02/15/2019 FINAL  Final  SARS Coronavirus 2 (Performed in [place] hospital lab)     Status: None   Collection Time: 02/12/2019 12:29 PM   Specimen: Nasopharyngeal Swab  Result Value Ref Range Status   SARS Coronavirus 2 NEGATIVE NEGATIVE Final    Comment: (NOTE) If result is NEGATIVE SARS-CoV-2 target nucleic acids are NOT DETECTED. The SARS-CoV-2 RNA is generally detectable in upper and lower  respiratory specimens during the acute phase of infection. The lowest  concentration of SARS-CoV-2 viral copies this assay can detect is 250    copies / mL. A negative result does not preclude SARS-CoV-2 infection  and should not be used as the sole basis for treatment or other  patient management decisions.  A negative result may occur with  improper specimen collection / handling, submission of specimen other  than nasopharyngeal swab, presence of viral mutation(s) within the  areas targeted by this assay, and inadequate number of viral copies  (<250 copies / mL). A negative result must be combined with clinical  observations, patient history, and epidemiological information. If result is POSITIVE SARS-CoV-2 target nucleic acids are DETECTED. The SARS-CoV-2 RNA is generally detectable in upper and lower  respiratory specimens dur ing the acute phase of infection.  Positive  results are indicative of active infection with SARS-CoV-2.  Clinical  correlation with patient history and other diagnostic information is  necessary to determine patient infection status.  Positive results do  not rule out bacterial infection or co-infection with other viruses. If result is PRESUMPTIVE POSTIVE SARS-CoV-2 nucleic acids MAY BE PRESENT.   A presumptive positive result was obtained on the submitted specimen  and confirmed on repeat testing.  While 2019 novel coronavirus  (SARS-CoV-2) nucleic acids may be present in the submitted sample  additional confirmatory testing may be necessary for epidemiological  and / or clinical management purposes  to differentiate between  SARS-CoV-2 and other Sarbecovirus currently known to infect humans.  If clinically indicated additional testing with an alternate test  methodology [phone]) is advised. The SARS-CoV-2 RNA is generally  detectable in upper and lower respiratory sp ecimens during the acute  phase of infection. The expected result is Negative. Fact Sheet for Patients:  StrictlyIdeas.no Fact Sheet for Healthcare  Providers: BankingDealers.co.za This test is not yet approved or cleared by the [place] FDA and has been authorized for detection and/or diagnosis of SARS-CoV-2 by FDA under an Emergency Use Authorization (EUA).  This EUA will remain in effect (meaning this test can be used) for the duration of the COVID-19  declaration under Section 564(b)(1) of the Act, 21 U.S.C. section 360bbb-3(b)(1), unless the authorization is terminated or revoked sooner. Performed at [facility], [address] [place],  [address]       Radiology Studies: No results found.      Scheduled Meds:  [START ON 02/16/2019] aspirin EC  81 mg Oral Daily   atorvastatin  10 mg Oral Daily   Chlorhexidine Gluconate Cloth  6 each Topical Q0600   [START ON 05-Mar-2019] darbepoetin (ARANESP) injection - DIALYSIS  60 mcg Intravenous Q Wed-HD   feeding supplement (PRO-STAT SUGAR FREE 64)  30 mL Oral BID   FLUoxetine  20 mg Oral Daily   insulin aspart  0-15 Units Subcutaneous TID WC   levETIRAcetam  250 mg Oral Q T,Th,Sat-1800   midodrine  10 mg Oral Q M,W,F-HD   midodrine  5 mg Oral BID WC   multivitamin  1 tablet Oral QHS   pantoprazole  40 mg Oral Daily   vitamin B-12  1,000 mcg Oral Daily   Continuous Infusions:  albumin human     phytonadione (VITAMIN K) IV     piperacillin-tazobactam (ZOSYN)  IV 12.5 mL/hr at 02/15/19 1500   vancomycin Stopped (02/15/19 1044)    Assessment & Plan:    1.  Left lower extremity wound necrosis with cellulitis/myositis: On IV Zosyn/vancomycin (dialysis patient).  White count up today at 16 K (was at 11.8 yesterday).  Been afebrile during the hospital course.  Seen by orthopedics and underwent ABI which indicate peripheral vascular disease left> than right but with biphasic flow.  Patient now has venous compression wrap of left lower extremity per Dr. Sharol Given to help reduce venous swelling.  Not sure if candidate for deeper  debridement given poor circulation.  Chest x ray negative so far.?  Wound cultures.  Of note, patient on Plaquenil at baseline for rheumatoid arthritis.  Will hold in the setting of acute infection.  Watch systolic blood pressure as noted to be fluctuating between 80 to 100.  Cautious with IV hydration given significant LE edema, concern for CHF and ESRD.  Patient also has chronic hypotension, on midodrine at baseline.  2.  ESRD/bleeding at AV fistula site: On hemodialysis TTS to accommodate patient census  (usually on MWF schedule as outpatient).  And has had episodes of prolonged bleeding after hemodialysis as outpatient and while here.  Hold heparin.  Additional vitamin K dosing given Coumadin level at 2.1 (did not receive dose ordered yesterday)  Plan for fistulogram by IR in a.m.  Patient also on Brilinta which will be d/ced as d/w cardiology.  Monitor hemoglobin and transfuse as needed  3.  Chronic hypotension: Watch for worsening in the setting of problem #1.  Cautious IV hydration as discussed above.  Continue high doses of midodrine. Mentating well  4.  Chronic atrial fibrillation: Holding Coumadin and concern for problem #2.  Hold heparin as well.  Rate controlled, cannot tolerate AV blockers due to problem #3  5.  Recurrent falls,?  Seizures: He was admitted 5/24, MRI brain no acute changes, EEG showed mild diffuse slowing, no epileptiform discharges. He was diagnosed with uremic encephalopathy and body jerking resolved with discontinuation of Gabapentin. He reports that he was taking more than prescribed due to postherpetic neuralgia. He was seen by Neurology for follow up in June who felt that his confusion and myoclonic jerks were likely due to gabapentin toxicity in the setting of uremic encephalopathy. Repeat EEG was normal this  month and neurology tapered Keppra to off (Per neuro note 7/17 :Keppra to 1/2 daily for one week then d/c. No additional dose is needed after dialysis). D/C Keppra.   PT evaluation, likely will need ALF.  6.  Acute on chronic combined CHF: Asymmetrical leg edema likely related to venous insufficiency and CHF.  Not on any other cardiac meds due to hypotension.Patient complained of dyspnea during the hospital course (7/29) and chest x-ray was suggestive of pulmonary edema.Repeat echo shows biventricular systolic dysfunction with EF 35-40%, severely dilated RV/ right atrial enlargement which are not very far from baseline. Appreciate cardiology recommendations.  Scheduled for dialysis today as going back to MWF schedule. Off 02 and saturating well on RA.  7.  Chronic anemia: Monitor for acute decompensation in the setting of problem #2.  On Aranesp q. weekly.  Also on vitamin B12 supplements.  8.  Rheumatoid arthritis: Hold Plaquenil for now.  9.  Peripheral vascular disease: Was on Brilinta/statins.Cardiology changed to aspirin . No h/o cardiac or peripheral vascular stents.  10.  Hyperlipidemia: On Lipitor.  11. Diabetes mellitus type 2: Sliding scale insulin for now.  On Victoza/Levemir as outpatient.  12 . Anxiety/depression: On Prozac/hydroxyzine.   DVT prophylaxis: Anticoagulated with Coumadin at baseline.  INR at 2.1 today Code Status: DNR Family / Patient Communication: Discussed with patient Disposition Plan: Seen by PT who recommended skilled nursing facility, alternatively 24-hour assistance/supervision.    LOS: 5 days    Time spent: 33 minutes    Bruce Shi, MD [facility] Pager [phone]  If 7PM-7AM, please contact night-coverage [email or web address] Password [facility] 02/15/2019, 3:58 PM

## 2019-02-15 NOTE — TOC Progression Note (Addendum)
Transition of Care [facility]) - Progression Note    Patient Details  Name: Bruce Cole MRN: [id number] Date of Birth: 16-Mar-1941  Transition of Care [facility]) CM/SW [place], [place] Phone Number: 02/15/2019, 11:35 AM  Clinical Narrative:    12:48pm- At this time only one offer available, [facility]. Will continue to follow.   11:35am- CSW spoke with pt daughter [name], reintroduced self, role, reason for call. We discussed some of pt confusion during the evening and pt daughter confirms that sometimes this has happened when he has gone to SNF but has resolved when he has settled in.   CSW shared that pt is recommended for SNF at this time and confirmed that pt daughter interested in referral out for SNF. She requested CSW to give opinion and is only really interested in 4/5 star facilities. We discussed that due to Medicare guidelines CSW could not give preferences or opinions about facilities but can provide CMS ratings when available. We also discussed that pt needs dialysis at [facility] 3x week and often 4/5 star facilities are unable to offer to dialysis patients because they cannot meet all their needs for transportation back and forth. Pt daughter states understanding.   Pt faxed out, will f/u with offers when available.    Expected Discharge Plan: Skilled Nursing Facility Barriers to Discharge: Ship broker, Continued Medical Work up, SNF Pending bed offer  Expected Discharge Plan and Services Expected Discharge Plan: [place] In-house Referral: Clinical Social Work Discharge Planning Services: CM Consult Post Acute Care Choice: [place] arrangements for the past 2 months: [place] Determinants of Health (SDOH) Interventions    Readmission Risk Interventions Readmission Risk Prevention Plan 02/13/2019  Transportation Screening Complete  Medication Review Press photographer) Complete  PCP or  Specialist appointment within 3-5 days of discharge Not Complete  PCP/Specialist Appt Not Complete comments likely pt will dc to SNF  [place] or Home Care Consult Complete  SW Recovery Care/Counseling Consult Complete  Palliative Care Screening Not Applicable  Skilled Nursing Facility Complete  Some recent data might be hidden

## 2019-02-15 NOTE — Plan of Care (Signed)
  Problem: Clinical Measurements: Goal: Respiratory complications will improve Outcome: Progressing Goal: Cardiovascular complication will be avoided Outcome: Progressing   Problem: Activity: Goal: Risk for activity intolerance will decrease Outcome: Progressing   Problem: Nutrition: Goal: Adequate nutrition will be maintained Outcome: Progressing   Problem: Coping: Goal: Level of anxiety will decrease Outcome: Progressing   Problem: Pain Managment: Goal: General experience of comfort will improve Outcome: Progressing   Problem: Safety: Goal: Ability to remain free from injury will improve Outcome: Progressing

## 2019-02-15 NOTE — Progress Notes (Signed)
Pharmacy Antibiotic Note  Bruce Cole is a 78 y.o. male admitted on 02/09/2019 with LLE cellulitis and necrosis.  Pharmacy has been consulted for Vanco/Zosyn dosing.  Asked RN to drawn pre-HD level but patient had already been on HD for ~1h; still requested level to be drawn. Appears vancomycin level is 30 and was drawn post-HD and after vancomycin 1 g was already given. Unsure what to make of level at this point.   ID: LLE cellulitis and necrosis, afeb, WBC down 14.8. Dosing for ESRD. - MRI > Extensive subcutaneous edema and mild intramuscular edema of the left lower extremity suggesting a nonspecific cellulitis and myositis.Similar findings are identified within the visualized portion of the contralateral right lower extremity. Left Achilles tendinosis without tear. - Extra HD 7/29- Vanco given  Vancomycin 07/26 >> Zosyn 07/26 >>  Cefazolin 1gm X 1  07/26 BCx: ngtd 07/26 COVID: Negative  Plan: - Vancomycin 1000 mg with dialysis TTS (normally MWF) - Zosyn 3.375g IV q12h (4 hour infusion) - consider d/c  - Consider pre-HD level on Mon   Height: 6\' 3"  (190.5 cm) Weight: 260 lb 2.3 oz (118 kg) IBW/kg (Calculated) : 84.5  Temp (24hrs), Avg:98.3 F (36.8 C), Min:97.7 F (36.5 C), Max:98.8 F (37.1 C)  Recent Labs  Lab 01/21/2019 1000 02/11/19 0325 02/12/19 0257 02/13/19 0233 02/14/19 0130 02/15/19 0500 02/15/19 0854 02/15/19 1220  WBC 15.7* 13.5* 13.4* 11.8* 16.7* 14.8*  --   --   CREATININE 8.78* 9.65* 11.17* 7.94* 5.94*  --  4.04*  --   LATICACIDVEN 2.0*  --   --   --   --   --   --   --   VANCORANDOM  --   --   --   --   --   --   --  30    Estimated Creatinine Clearance: 21.9 mL/min (A) (by C-G formula based on SCr of 4.04 mg/dL (H)).    Allergies  Allergen Reactions  . Neurontin [Gabapentin] Other (See Comments)    Encephalopathy with myoclonic jerking  . Ace Inhibitors Other (See Comments)    Worsening renal insufficiency    Thank you for involving  pharmacy in this patient's care.  Renold Genta, PharmD, BCPS Clinical Pharmacist Clinical phone for 02/15/2019 until 3p is [phone] 02/15/2019 1:31 PM  **Pharmacist phone directory can be found on [email or web address] listed under [place]**

## 2019-02-15 NOTE — Progress Notes (Signed)
The patient was very agitated and confused during the night. He repeatedly called out and asked [place] to call PT so they could work with him. This RN told him multiple times that PT would be back in the morning to work with him. The patient insisted and stated that we were mistreating him despite this RN assuring him that he was going to work with PT later.

## 2019-02-15 NOTE — Progress Notes (Addendum)
Called to patient's room to speak with patient's daughter [facility]) on the room phone. [name] was irate in speaking with me stating the patient told her he had to call the police because he was not being taken care of. I am unaware of when this incident took place and I explained this to the daughter. She states she wants to know why the hospital would not call her to let her know her father had to call the police. She is demanding a superior call her today. She made several comments such as, she will bring the news crew to the hospital, she will go to jail for her father, she will kick someone's ass, and she will bring her lawyer sister to the hospital. I explained to her I would have a supervisor call her today regarding this situation.   I am not sure who the staff was when all of this took place and one thing the patient's daughter demanded was she did not want the staff that was associated with this to be within 2 feet of her father again.    Elmo Putt, [facility] notified and states she will call her before she leaves today.

## 2019-02-15 NOTE — Consult Note (Signed)
   [facility] CM Inpatient Consult   02/15/2019  Bruce Cole 12-Nov-1940 [id number]    Patient checked for potential [facility] care management services needed with a 39% extreme high risk score for unplanned readmission, 2 hospitalizations and 5 ED visit in the past 6 months under his Medicare/ [employer] plan.  Patient had been engaged by [facility] care management coordinators in the past, but not currently active.  Our community based plan of care has focused on disease management and community resource support.  Chart review andMD, history and physical on 01/31/2019 and MD narrative note dated 7/29/20show as follows:  [name] is a 78 y/o male with ESRD, HTN, combined CHF, A-fib on Coumadin, DM2, RH Arthritis, OSA seizures,  who presents after a fall at home, noted with right leg and thigh cellulitis and necrosis, started on antibiotics. He lives alone; has a Marine scientist (Building services engineer) coming in the mornings and son/ grandchildren checking on him in the evening.  Transition of care team notes state that patient is recommended for SNF (skilled nursing facility) at this time and confirmed that patient's daughter is interested in referral out for SNF. TOC SW working with patient/ daughter in finding an appropriate SNF that could provide transportation to his dialysis.  Patient's primary care provider is Debbrah Alar, NP with [employer] at [facility], listed as providing transition of care follow-up.  Will followforprogress anddisposition.Ifthere are anydisposition changes,and needs for appropriate community follow-up,please referto [facility] care management.  Of note, [facility] Care Management services does not replace or interfere with any services that are arranged by transition of care case management or social work.   For questions and additional information, please call:  Neri Samek A. Jameil Whitmoyer, BSN, RN-BC [facility] Liaison Cell: [phone]

## 2019-02-15 NOTE — Progress Notes (Signed)
Gave report to RN in dialysis

## 2019-02-16 DIAGNOSIS — I5042 Chronic combined systolic (congestive) and diastolic (congestive) heart failure: Secondary | ICD-10-CM

## 2019-02-16 LAB — CBC
HCT: 29.5 % — ABNORMAL LOW (ref 39.0–52.0)
Hemoglobin: 9.4 g/dL — ABNORMAL LOW (ref 13.0–17.0)
MCH: 30.8 pg (ref 26.0–34.0)
MCHC: 31.9 g/dL (ref 30.0–36.0)
MCV: 96.7 fL (ref 80.0–100.0)
Platelets: 275 10*3/uL (ref 150–400)
RBC: 3.05 MIL/uL — ABNORMAL LOW (ref 4.22–5.81)
RDW: 19.9 % — ABNORMAL HIGH (ref 11.5–15.5)
WBC: 13.4 10*3/uL — ABNORMAL HIGH (ref 4.0–10.5)
nRBC: 0.3 % — ABNORMAL HIGH (ref 0.0–0.2)

## 2019-02-16 LAB — RENAL FUNCTION PANEL
Albumin: 3.1 g/dL — ABNORMAL LOW (ref 3.5–5.0)
Anion gap: 13 (ref 5–15)
BUN: 27 mg/dL — ABNORMAL HIGH (ref 8–23)
CO2: 24 mmol/L (ref 22–32)
Calcium: 9.6 mg/dL (ref 8.9–10.3)
Chloride: 95 mmol/L — ABNORMAL LOW (ref 98–111)
Creatinine, Ser: 5.34 mg/dL — ABNORMAL HIGH (ref 0.61–1.24)
GFR calc Af Amer: 11 mL/min — ABNORMAL LOW (ref >60)
GFR calc non Af Amer: 10 mL/min — ABNORMAL LOW (ref >60)
Glucose, Bld: 135 mg/dL — ABNORMAL HIGH (ref 70–99)
Phosphorus: 3.8 mg/dL (ref 2.5–4.6)
Potassium: 4.1 mmol/L (ref 3.5–5.1)
Sodium: 132 mmol/L — ABNORMAL LOW (ref 135–145)

## 2019-02-16 LAB — GLUCOSE, CAPILLARY
Glucose-Capillary: 108 mg/dL — ABNORMAL HIGH (ref 70–99)
Glucose-Capillary: 111 mg/dL — ABNORMAL HIGH (ref 70–99)
Glucose-Capillary: 121 mg/dL — ABNORMAL HIGH (ref 70–99)
Glucose-Capillary: 103 mg/dL — ABNORMAL HIGH (ref 70–99)
Glucose-Capillary: 109 mg/dL — ABNORMAL HIGH (ref 70–99)

## 2019-02-16 LAB — PROTIME-INR
INR: 2.1 — ABNORMAL HIGH (ref 0.8–1.2)
Prothrombin Time: 23.2 seconds — ABNORMAL HIGH (ref 11.4–15.2)

## 2019-02-16 MED ORDER — MUPIROCIN 2 % EX OINT
1.0000 "application " | TOPICAL_OINTMENT | Freq: Two times a day (BID) | CUTANEOUS | Status: DC
  Administered 2019-02-16 – 2019-02-20 (×9): 1 via NASAL
  Filled 2019-02-16 (×3): qty 22

## 2019-02-16 NOTE — Progress Notes (Signed)
Patient ID: KHRISTOPHER KAPAUN, male   DOB: 1940-09-30, 78 y.o.   MRN: [id number] Patient is not very arousable this morning he will open his eyes but does not interact.  The dressing was removed on the left leg.  The skin is wrinkling much better now there is good bleeding around the edges of the eschar there is no crepitation no cellulitis no purulence no signs of infection or necrotic fascia.  Patient will need continued compression for both lower extremities.    I will follow-up as an outpatient.

## 2019-02-16 NOTE — Progress Notes (Addendum)
Pt asleep at this time. RR is much better. Pt still on oxygen. Offered pt some water to drink to moisten mouth.

## 2019-02-16 NOTE — Progress Notes (Signed)
[facility] Progress Note   Dialysis Orders:  Center:[facility] MWF. 2K/ 2.25 Ca; EDW 123.5kg (last post weight +3.4kg); LUE AVF Heparin 2000 unit bolus Mircera 176mcg IV q 2 weeks  Renvela 800mg  3 tabs PO TID  Assessment/Plan: 1. SOB - neg UF 3.2 L Wed and 3 L Thursday and 900 ml Friday although post wt Friday was 2 L below pre weight at 118 - post wt 117 Thursday - either way will need much lower wt at d/c EF 35 - 50% with severely dilated right atrium; next HD Monday  2. ESRD - MWF - lab today pending 3. Anemia - hgb 9.4 stable - weekly ESA 4. Secondary hyperparathyroidism - binders on hold due to P 2s  5. HTN/volume - BP drops on HD limited UF yesterday - walks around at home so we need standing wts/orthostatics at his next HD - on  Midodrine for BP support  6. Nutrition - alb 3.3 - on vit/supplements 7. Cellulitis/wound- LLE neg osteo on MRI on Vanc/Zosyn BC neg - needs compression stockings per ortho -  8. Prolonged bleeding from AVF - has been happening from both needle sites at home - plan for f'gram on Monday.- no acute need this weekend 9. Afib -coumadin on hold - INR still elevated at 2.1 given 2.5 mg vit K Friday - apparently on brilinta and coumadin prior to admission per-  cardiology dc'd brilinta - no prior stents - repeat INR daily - avoid low potassium on dialysis 10. Sz d/o on keppra 11. DM - per primary 12. Disp - planning SNF - I don't think he is safe to manage himself at home without supervision  Myriam Jacobson, PA-C [place] [phone] 02/16/2019,9:11 AM  LOS: 6 days   Subjective:   Wants help to sit up -didn't eat much of breakfast   Objective Vitals:   02/16/19 0231 02/16/19 0258 02/16/19 0400 02/16/19 0625  BP:  98/65  (!) 119/102  Pulse: 69 60  95  Resp: (!) 22 20 20 17   Temp:  98.2 F (36.8 C)  98.1 F (36.7 C)  TempSrc:    Oral  SpO2: 100% 100%  100%  Weight:      Height:       Physical  Exam General: chronically ill appearing NAD Heart: RRR Lungs: dim BS grossly clear Abdomen: soft NT + BS Extremities: tr - 1 + LE edema - improved wrinkling noted in skin Dialysis Access: right upper AVF  + bruit   Additional Objective Labs: Lab Results  Component Value Date   INR 2.1 (H) 02/16/2019   INR 2.1 (H) 02/15/2019   INR 2.0 (H) 32/95/1884    Basic Metabolic Panel: Recent Labs  Lab 02/13/19 0233 02/14/19 0130 02/15/19 0854  NA 134* 135 135  K 3.7 3.6 3.7  CL 94* 94* 94*  CO2 22 26 25   GLUCOSE 118* 123* 103*  BUN 47* 25* 16  CREATININE 7.94* 5.94* 4.04*  CALCIUM 9.0 9.4 9.3  PHOS 3.0 2.8 2.5   Liver Function Tests: Recent Labs  Lab 02/06/2019 1000  02/13/19 0233 02/14/19 0130 02/15/19 0854  AST 30  --   --   --   --   ALT 18  --   --   --   --   ALKPHOS 269*  --   --   --   --   BILITOT 1.2  --   --   --   --  PROT 7.9  --   --   --   --   ALBUMIN 3.3*   < > 2.7* 2.9* 3.3*   < > = values in this interval not displayed.   No results for input(s): LIPASE, AMYLASE in the last 168 hours. CBC: Recent Labs  Lab 02/11/2019 1000  02/12/19 0257 02/13/19 0233 02/14/19 0130 02/15/19 0500 02/16/19 0258  WBC 15.7*   < > 13.4* 11.8* 16.7* 14.8* 13.4*  NEUTROABS 13.6*  --   --   --   --   --   --   HGB 10.0*   < > 9.6* 9.6* 9.3* 9.2* 9.4*  HCT 31.5*   < > 29.5* 29.9* 29.5* 29.4* 29.5*  MCV 96.9   < > 92.8 95.8 97.0 98.0 96.7  PLT 180   < > 193 217 247 254 275   < > = values in this interval not displayed.   Blood Culture    Component Value Date/Time   SDES  02/07/2019 0950    BLOOD RIGHT HAND Performed at [facility], [address] [place], [place] [address]    [facility]  02/07/2019 0950    BOTTLES DRAWN AEROBIC AND ANAEROBIC Blood Culture results may not be optimal due to an inadequate volume of blood received in culture bottles Performed at [facility], [address], [place], [place] [address]    CULT  02/11/2019 0950     NO GROWTH 5 DAYS Performed at [facility], [place] [address], [place], [place] [address]    REPTSTATUS 02/15/2019 FINAL 02/14/2019 0950    Cardiac Enzymes: Recent Labs  Lab 01/31/2019 1000  CKTOTAL 198   CBG: Recent Labs  Lab 02/15/19 1310 02/15/19 1647 02/15/19 2057 02/16/19 0252 02/16/19 0757  GLUCAP 87 100* 111* 108* 111*   Iron Studies: No results for input(s): IRON, TIBC, TRANSFERRIN, FERRITIN in the last 72 hours. Lab Results  Component Value Date   INR 2.1 (H) 02/16/2019   INR 2.1 (H) 02/15/2019   INR 2.0 (H) 02/14/2019   Studies/Results: No results found. Medications: . albumin human    . piperacillin-tazobactam (ZOSYN)  IV 3.375 g (02/15/19 2007)  . vancomycin Stopped (02/15/19 1044)   . aspirin EC  81 mg Oral Daily  . atorvastatin  10 mg Oral Daily  . Chlorhexidine Gluconate Cloth  6 each Topical Q0600  . [START ON 03/08/2019] darbepoetin (ARANESP) injection - DIALYSIS  60 mcg Intravenous Q Wed-HD  . feeding supplement (PRO-STAT SUGAR FREE 64)  30 mL Oral BID  . FLUoxetine  20 mg Oral Daily  . insulin aspart  0-15 Units Subcutaneous TID WC  . midodrine  10 mg Oral Q M,W,F-HD  . midodrine  5 mg Oral BID WC  . multivitamin  1 tablet Oral QHS  . mupirocin ointment  1 application Nasal BID  . pantoprazole  40 mg Oral Daily  . vitamin B-12  1,000 mcg Oral Daily

## 2019-02-16 NOTE — Progress Notes (Signed)
Patient has been yelling out intermittently for 30 minutes. He remains disoriented to person, time and situation. Verbalized some discomfort on his L lower leg and itching. PRN meds given. Will continue to monitor.

## 2019-02-16 NOTE — Progress Notes (Signed)
Pt asleep at this time

## 2019-02-16 NOTE — Progress Notes (Addendum)
PROGRESS NOTE    Bruce Cole  KXF:[id number]  DOB: 07/18/1941  DOA: 01/22/2019 PCP: Debbrah Alar, NP  Brief Narrative:  78 y.o. male with medical history significant of ESRD on MWF HD,chronic hypotension on Midodrin,  A. fib on Coumadin; chronic zoster on Valtrex; rheumatoid arthritis on Plaquenil, chronic combined CHF who was admitted to this [facility] in May for falls/possible seizures and started on Keppra by neurology-> discharged to acute rehab-> home on 6/17 with home health now presents with generalized weakness, recurrent falls x3 over the last week. He presented to the ED after a fall ( slid down to the floor as he was feeling weak and could not unlock the walker while transitioning off commode).He stayed on the floor for ~5 hours until caregiver found him.Patient lives alone; has a nurse coming in in the mornings and son/grandchildren check on him in the evening. During his prior admission, he fell on his hip and was complaining about pain.  He was discharged to [facility] rehab and a scar developed on the back of his leg.He presented to [facility] where he was noted to have a significant area of necrosis on his left posterior calf with foul-smelling discharge and significant edema of most of the LLE.  Patient transferred here and admitted to Hospitalist's service with empiric antibiotics (Vanco/Zosyn) and orthopedics consulted.  MRI obtained on 7/27 showed extensive subcutaneous edema /mild intramuscular edema of the LLE concerning for cellulitis/myositis.  There was no evidence of necrotizing fasciitis or abscess or osteomyelitis.  Patient has also been seen by nephrology and receiving HD (changed to TTS as inpatient).  Hospital course has been complicated by prolonged bleeding from AV fistula postdialysis.  Patient planned for fistulogram and was initiated on heparin while Coumadin on hold.  Subjective:  Patient resting comfortably and holding portable fan to his face, says  feels hot. Seen by orthopedics for f/u and dressing changed/pictures updated. Feels somewhat dyspneic but appears comfortable.   Objective: Vitals:   02/16/19 0231 02/16/19 0258 02/16/19 0400 02/16/19 0625  BP:  98/65  (!) 119/102  Pulse: 69 60  95  Resp: (!) 22 20 20 17   Temp:  98.2 F (36.8 C)  98.1 F (36.7 C)  TempSrc:    Oral  SpO2: 100% 100%  100%  Weight:      Height:        Intake/Output Summary (Last 24 hours) at 02/16/2019 1220 Last data filed at 02/16/2019 0900 Gross per 24 hour  Intake 664.55 ml  Output -  Net 664.55 ml   Filed Weights   02/14/19 1835 02/15/19 0715 02/15/19 1114  Weight: 117 kg 120 kg 118 kg    Physical Examination:  General exam: Appears awake, alert, calm and comfortable  Respiratory system: Clear to auscultation. Respiratory effort normal. Cardiovascular system: S1 & S2 heard, RRR. No JVD, murmurs, rubs, gallops or clicks. Gastrointestinal system: Abdomen is nondistended, soft and nontender. No organomegaly or masses felt. Normal bowel sounds heard. Central nervous system: Alert and oriented. No focal neurological deficits. Extremities: Symmetric 4 x 5 power. Skin: He has 2 stage 1 pressure ulcers on his right buttock.  He was noted to have a significant area of necrosis on his left posterior calf that was quite foul-smelling on presentation, improved now with compression dressing (changed today by orthopedics). See before and after pics below: No evidence of redness noted currently and no RLE although on admission there was some evidence of milder trauma and mild erythema without frank  cellulitis(pictures in media) Psychiatry: Judgement and insight appear normal. Mood & affect appropriate.             Signed          From Today (8/1) uploaded by dr Sharol Given    I will follow-up as an outpatient.            Data Reviewed: I have personally reviewed following labs and imaging studies  CBC: Recent Labs  Lab 02/05/2019 1000   02/12/19 0257 02/13/19 0233 02/14/19 0130 02/15/19 0500 02/16/19 0258  WBC 15.7*   < > 13.4* 11.8* 16.7* 14.8* 13.4*  NEUTROABS 13.6*  --   --   --   --   --   --   HGB 10.0*   < > 9.6* 9.6* 9.3* 9.2* 9.4*  HCT 31.5*   < > 29.5* 29.9* 29.5* 29.4* 29.5*  MCV 96.9   < > 92.8 95.8 97.0 98.0 96.7  PLT 180   < > 193 217 247 254 275   < > = values in this interval not displayed.   Basic Metabolic Panel: Recent Labs  Lab 02/12/19 0257 02/13/19 0233 02/14/19 0130 02/15/19 0854 02/16/19 0930  NA 130* 134* 135 135 132*  K 3.7 3.7 3.6 3.7 4.1  CL 90* 94* 94* 94* 95*  CO2 21* 22 26 25 24   GLUCOSE 126* 118* 123* 103* 135*  BUN 88* 47* 25* 16 27*  CREATININE 11.17* 7.94* 5.94* 4.04* 5.34*  CALCIUM 8.9 9.0 9.4 9.3 9.6  PHOS 4.0 3.0 2.8 2.5 3.8   GFR: Estimated Creatinine Clearance: 16.6 mL/min (A) (by C-G formula based on SCr of 5.34 mg/dL (H)). Liver Function Tests: Recent Labs  Lab 02/05/2019 1000 02/12/19 0257 02/13/19 0233 02/14/19 0130 02/15/19 0854 02/16/19 0930  AST 30  --   --   --   --   --   ALT 18  --   --   --   --   --   ALKPHOS 269*  --   --   --   --   --   BILITOT 1.2  --   --   --   --   --   PROT 7.9  --   --   --   --   --   ALBUMIN 3.3* 2.7* 2.7* 2.9* 3.3* 3.1*   No results for input(s): LIPASE, AMYLASE in the last 168 hours. No results for input(s): AMMONIA in the last 168 hours. Coagulation Profile: Recent Labs  Lab 02/05/2019 1046 02/12/19 0924 02/14/19 0130 02/15/19 1220 02/16/19 0258  INR 1.5* 1.6* 2.0* 2.1* 2.1*   Cardiac Enzymes: Recent Labs  Lab 02/09/2019 1000  CKTOTAL 198   BNP (last 3 results) No results for input(s): PROBNP in the last 8760 hours. HbA1C: No results for input(s): HGBA1C in the last 72 hours. CBG: Recent Labs  Lab 02/15/19 1310 02/15/19 1647 02/15/19 2057 02/16/19 0252 02/16/19 0757  GLUCAP 87 100* 111* 108* 111*   Lipid Profile: No results for input(s): CHOL, HDL, LDLCALC, TRIG, CHOLHDL, LDLDIRECT in the  last 72 hours. Thyroid Function Tests: No results for input(s): TSH, T4TOTAL, FREET4, T3FREE, THYROIDAB in the last 72 hours. Anemia Panel: No results for input(s): VITAMINB12, FOLATE, FERRITIN, TIBC, IRON, RETICCTPCT in the last 72 hours. Sepsis Labs: Recent Labs  Lab 02/11/2019 1000  LATICACIDVEN 2.0*    Recent Results (from the past 240 hour(s))  Blood Culture (routine x 2)     Status: None  Collection Time: 01/29/2019  9:30 AM   Specimen: BLOOD RIGHT ARM  Result Value Ref Range Status   Specimen Description   Final    BLOOD RIGHT ARM Performed at [facility], [address] [place], [place] [address]    Special Requests   Final    BOTTLES DRAWN AEROBIC AND ANAEROBIC Blood Culture adequate volume Performed at [facility], [address] [place], [place] [address]    Culture   Final    NO GROWTH 5 DAYS Performed at [facility], [place] [address], [place], [place] [address]    Report Status 02/15/2019 FINAL  Final  Blood Culture (routine x 2)     Status: None   Collection Time: 01/21/2019  9:50 AM   Specimen: BLOOD RIGHT HAND  Result Value Ref Range Status   Specimen Description   Final    BLOOD RIGHT HAND Performed at [facility], [address] [place], [place] [address]    Special Requests   Final    BOTTLES DRAWN AEROBIC AND ANAEROBIC Blood Culture results may not be optimal due to an inadequate volume of blood received in culture bottles Performed at [facility], [address] [place], [place] [address]    Culture   Final    NO GROWTH 5 DAYS Performed at [facility], [place] [address], [place], [place] [address]    Report Status 02/15/2019 FINAL  Final  SARS Coronavirus 2 (Performed in [place] hospital lab)     Status: None   Collection Time: 01/17/2019 12:29 PM   Specimen: Nasopharyngeal Swab  Result Value Ref Range Status   SARS Coronavirus 2 NEGATIVE NEGATIVE Final    Comment: (NOTE) If result is  NEGATIVE SARS-CoV-2 target nucleic acids are NOT DETECTED. The SARS-CoV-2 RNA is generally detectable in upper and lower  respiratory specimens during the acute phase of infection. The lowest  concentration of SARS-CoV-2 viral copies this assay can detect is 250  copies / mL. A negative result does not preclude SARS-CoV-2 infection  and should not be used as the sole basis for treatment or other  patient management decisions.  A negative result may occur with  improper specimen collection / handling, submission of specimen other  than nasopharyngeal swab, presence of viral mutation(s) within the  areas targeted by this assay, and inadequate number of viral copies  (<250 copies / mL). A negative result must be combined with clinical  observations, patient history, and epidemiological information. If result is POSITIVE SARS-CoV-2 target nucleic acids are DETECTED. The SARS-CoV-2 RNA is generally detectable in upper and lower  respiratory specimens dur ing the acute phase of infection.  Positive  results are indicative of active infection with SARS-CoV-2.  Clinical  correlation with patient history and other diagnostic information is  necessary to determine patient infection status.  Positive results do  not rule out bacterial infection or co-infection with other viruses. If result is PRESUMPTIVE POSTIVE SARS-CoV-2 nucleic acids MAY BE PRESENT.   A presumptive positive result was obtained on the submitted specimen  and confirmed on repeat testing.  While 2019 novel coronavirus  (SARS-CoV-2) nucleic acids may be present in the submitted sample  additional confirmatory testing may be necessary for epidemiological  and / or clinical management purposes  to differentiate between  SARS-CoV-2 and other Sarbecovirus currently known to infect humans.  If clinically indicated additional testing with an alternate test  methodology [phone]) is advised. The SARS-CoV-2 RNA is generally  detectable  in upper and lower respiratory sp ecimens during the acute  phase of infection. The expected result is Negative. Fact Sheet for Patients:  StrictlyIdeas.no Fact Sheet for Healthcare Providers: BankingDealers.co.za This test is not yet approved or cleared by the [place] FDA and has been authorized for detection and/or diagnosis of SARS-CoV-2 by FDA under an Emergency Use Authorization (EUA).  This EUA will remain in effect (meaning this test can be used) for the duration of the COVID-19 declaration under Section 564(b)(1) of the Act, 21 U.S.C. section 360bbb-3(b)(1), unless the authorization is terminated or revoked sooner. Performed at [facility], [address] [place], [place] [address]   MRSA PCR Screening     Status: Abnormal   Collection Time: 02/15/19  6:18 PM   Specimen: Nasal Mucosa; Nasopharyngeal  Result Value Ref Range Status   MRSA by PCR POSITIVE (A) NEGATIVE Final    Comment:        The GeneXpert MRSA Assay (FDA approved for NASAL specimens only), is one component of a comprehensive MRSA colonization surveillance program. It is not intended to diagnose MRSA infection nor to guide or monitor treatment for MRSA infections. RESULT CALLED TO, READ BACK BY AND VERIFIED WITH: Delfino Lovett RN 20:40 02/15/19 ([email or web address]) Performed at [facility], [place] [address] [place], [place] [address]       Radiology Studies: No results found.      Scheduled Meds: . aspirin EC  81 mg Oral Daily  . atorvastatin  10 mg Oral Daily  . Chlorhexidine Gluconate Cloth  6 each Topical Q0600  . [START ON 03-18-2019] darbepoetin (ARANESP) injection - DIALYSIS  60 mcg Intravenous Q Wed-HD  . feeding supplement (PRO-STAT SUGAR FREE 64)  30 mL Oral BID  . FLUoxetine  20 mg Oral Daily  . insulin aspart  0-15 Units Subcutaneous TID WC  . midodrine  10 mg Oral Q M,W,F-HD  . midodrine  5 mg Oral BID WC  . multivitamin  1  tablet Oral QHS  . mupirocin ointment  1 application Nasal BID  . pantoprazole  40 mg Oral Daily  . vitamin B-12  1,000 mcg Oral Daily   Continuous Infusions: . albumin human    . piperacillin-tazobactam (ZOSYN)  IV 3.375 g (02/16/19 0923)  . vancomycin Stopped (02/15/19 1044)    Assessment & Plan:    1.  Left lower extremity wound necrosis with cellulitis/myositis: Admitted with IV Zosyn/vancomycin. White count  fluctuating , highest at 16.7, down to 13k today.  MRSA PCR positive. Continue vancomycin and monitor off zosyn. Been afebrile during the hospital course.  Seen by orthopedics and underwent ABI which indicate peripheral vascular disease left> than right but with biphasic flow.  Patient now has venous compression wrap of left lower extremity per Dr. Sharol Given to help reduce venous swelling. No need for deeper debridement per Dr Sharol Given as improved now and nothing to culture.Of note, patient on Plaquenil at baseline for rheumatoid arthritis which is held in the setting of acute infection.  Watch systolic blood pressure as noted to be fluctuating between 80 to 100.  Cautious with IV hydration given significant LE edema, concern for CHF and ESRD.  Patient also has chronic hypotension, on midodrine at baseline.  2.  ESRD/bleeding at AV fistula site: On hemodialysis TTS to accommodate patient census  (usually on MWF schedule as outpatient).  And has had episodes of prolonged bleeding  after hemodialysis as outpatient and while here.  Hold heparin.  S/p vitamin K dosing given Coumadin level at 2.1.  Plan for fistulogram by IR in a.m.  Now off Brilinta as d/w cardiology.  Monitor hemoglobin and transfuse as needed (so far stable around 9.4)  3.  Chronic hypotension: Watch for worsening in the setting of problem #1.  Cautious IV hydration as discussed above.  Continue high doses of midodrine. Mentating well  4.  Chronic atrial fibrillation: Holding Coumadin and concern for problem #2.  Hold heparin as  well.  Rate controlled, cannot tolerate AV blockers due to problem #3  5.  Recurrent falls,?  Seizures: He was admitted 5/24, MRI brain no acute changes, EEG showed mild diffuse slowing, no epileptiform discharges. He was diagnosed with uremic encephalopathy and body jerking resolved with discontinuation of Gabapentin. He reports that he was taking more than prescribed due to postherpetic neuralgia. He was seen by Neurology for follow up in June who felt that his confusion and myoclonic jerks were likely due to gabapentin toxicity in the setting of uremic encephalopathy. Repeat EEG was normal this month and neurology tapered Keppra to off (Per neuro note 7/17 :Keppra to 1/2 daily for one week then d/c. No additional dose is needed after dialysis). D/Ced Keppra.  PT evaluation, likely will need ALF.  6.  Acute on chronic combined CHF: Asymmetrical leg edema likely related to venous insufficiency and CHF.  Not on any other cardiac meds due to hypotension.Patient complained of dyspnea during the hospital course (7/29) and chest x-ray was suggestive of pulmonary edema.Repeat echo shows biventricular systolic dysfunction with EF 35-40%, severely dilated RV/ right atrial enlargement which are not very far from baseline. Appreciate cardiology recommendations.  S/p for dialysis 7/31 as going back to MWF schedule. Off 02 and saturating well on RA.  7.  Chronic anemia: Monitor for acute decompensation in the setting of problem #2.  On Aranesp q. weekly.  Also on vitamin B12 supplements.  8.  Rheumatoid arthritis: Hold Plaquenil for now.  9.  Peripheral vascular disease: Was on Brilinta/statins.Cardiology changed to aspirin . No h/o cardiac or peripheral vascular stents.  10.  Hyperlipidemia: On Lipitor.  11. Diabetes mellitus type 2: Sliding scale insulin for now.  On Victoza/Levemir as outpatient.  12 . Anxiety/depression: On Prozac/hydroxyzine.   DVT prophylaxis: Anticoagulated with Coumadin at baseline.   INR at 2.1 today Code Status: DNR Family / Patient Communication: Discussed with patient, d/w nephrology Dr Royce Macadamia today and ortho Dr Sharol Given yesterday. Disposition Plan: Seen by PT who recommended skilled nursing facility, alternatively 24-hour assistance/supervision.    LOS: 6 days    Time spent: 35 minutes    Guilford Shi, MD [facility] Pager [phone]  If 7PM-7AM, please contact night-coverage [email or web address] Password [email or web address] 02/16/2019, 12:20 PM

## 2019-02-16 NOTE — Progress Notes (Addendum)
CRITICAL VALUE ALERT  Critical Value:  Positive for MRSA per PCR  Date & Time Notied:  08/1, 0120  Provider Notified: [facility]  Orders Received/Actions taken: Standing orders applied

## 2019-02-16 NOTE — Progress Notes (Addendum)
Pt trying to get out of the bed, reorient pt. Repositioned pt in bed. Pt short of breath from trying to get out of bed. O2 sat is 100, RR at 22. Lung sounds clear.  Oxygen 2L/min O2 per nasal canula provided for comfort. Will monitor pt.

## 2019-02-16 NOTE — Progress Notes (Signed)
[facility] will sign off.   Medication Recommendations:  See 7/31 consult note Other recommendations (labs, testing, etc):  none Follow up as an outpatient:  FU as previously scheduled

## 2019-02-16 NOTE — Progress Notes (Signed)
Pt still trying to get out of the bed. Pt had a large bowel movement, total bed bath given with total bed change. Gown changed. Oriented pt. Pt's breathing better at this time. Vitals within normal limits.

## 2019-02-16 NOTE — Progress Notes (Addendum)
1930  Helped pt eat food. Provided drink. 2030  Tylenol given to pt for pain to the leg. Repositioned pt in bed. 2145  Pt called out for bedpan. Pt just had a smear. Cleaned pt up and repositioned in bed. 0015  Pt yelling out. Confused. Pt trying to get up from bed and said he wanted to go home and  meet his mom at the train station. Oriented pt but pt not easy to orient. Provided  reassurance. repositioned pt in bed.

## 2019-02-16 DEATH — deceased

## 2019-02-17 LAB — BASIC METABOLIC PANEL
Anion gap: 17 — ABNORMAL HIGH (ref 5–15)
BUN: 37 mg/dL — ABNORMAL HIGH (ref 8–23)
CO2: 23 mmol/L (ref 22–32)
Calcium: 9.8 mg/dL (ref 8.9–10.3)
Chloride: 94 mmol/L — ABNORMAL LOW (ref 98–111)
Creatinine, Ser: 6.54 mg/dL — ABNORMAL HIGH (ref 0.61–1.24)
GFR calc Af Amer: 9 mL/min — ABNORMAL LOW (ref >60)
GFR calc non Af Amer: 8 mL/min — ABNORMAL LOW (ref >60)
Glucose, Bld: 105 mg/dL — ABNORMAL HIGH (ref 70–99)
Potassium: 4.1 mmol/L (ref 3.5–5.1)
Sodium: 134 mmol/L — ABNORMAL LOW (ref 135–145)

## 2019-02-17 LAB — CBC
HCT: 30.2 % — ABNORMAL LOW (ref 39.0–52.0)
Hemoglobin: 9.5 g/dL — ABNORMAL LOW (ref 13.0–17.0)
MCH: 30.3 pg (ref 26.0–34.0)
MCHC: 31.5 g/dL (ref 30.0–36.0)
MCV: 96.2 fL (ref 80.0–100.0)
Platelets: 296 10*3/uL (ref 150–400)
RBC: 3.14 MIL/uL — ABNORMAL LOW (ref 4.22–5.81)
RDW: 19.7 % — ABNORMAL HIGH (ref 11.5–15.5)
WBC: 13.9 10*3/uL — ABNORMAL HIGH (ref 4.0–10.5)
nRBC: 0.4 % — ABNORMAL HIGH (ref 0.0–0.2)

## 2019-02-17 LAB — GLUCOSE, CAPILLARY
Glucose-Capillary: 113 mg/dL — ABNORMAL HIGH (ref 70–99)
Glucose-Capillary: 141 mg/dL — ABNORMAL HIGH (ref 70–99)

## 2019-02-17 LAB — PROTIME-INR
INR: 1.7 — ABNORMAL HIGH (ref 0.8–1.2)
Prothrombin Time: 19.6 seconds — ABNORMAL HIGH (ref 11.4–15.2)

## 2019-02-17 MED ORDER — DARBEPOETIN ALFA 100 MCG/0.5ML IJ SOSY
100.0000 ug | PREFILLED_SYRINGE | INTRAMUSCULAR | Status: DC
  Administered 2019-02-20: 100 ug via INTRAVENOUS
  Filled 2019-02-17: qty 0.5

## 2019-02-17 MED ORDER — MIDODRINE HCL 5 MG PO TABS
10.0000 mg | ORAL_TABLET | Freq: Once | ORAL | Status: AC
  Administered 2019-02-17: 10 mg via ORAL

## 2019-02-17 MED ORDER — CHLORHEXIDINE GLUCONATE CLOTH 2 % EX PADS
6.0000 | MEDICATED_PAD | Freq: Every day | CUTANEOUS | Status: DC
  Administered 2019-02-17 – 2019-02-20 (×2): 6 via TOPICAL

## 2019-02-17 MED ORDER — MIDODRINE HCL 5 MG PO TABS
ORAL_TABLET | ORAL | Status: AC
  Administered 2019-02-17: 10 mg via ORAL
  Filled 2019-02-17: qty 2

## 2019-02-17 MED ORDER — CHLORHEXIDINE GLUCONATE CLOTH 2 % EX PADS
6.0000 | MEDICATED_PAD | Freq: Every day | CUTANEOUS | Status: DC
  Administered 2019-02-17 – 2019-02-18 (×2): 6 via TOPICAL

## 2019-02-17 MED ORDER — VANCOMYCIN HCL IN DEXTROSE 1-5 GM/200ML-% IV SOLN
1000.0000 mg | INTRAVENOUS | Status: AC
  Administered 2019-02-17: 1000 mg via INTRAVENOUS
  Filled 2019-02-17: qty 200

## 2019-02-17 NOTE — Progress Notes (Signed)
1724cc removed during hemodialysis today

## 2019-02-17 NOTE — TOC Progression Note (Addendum)
Transition of Care [facility]) - Progression Note    Patient Details  Name: Bruce Cole MRN: [id number] Date of Birth: [birth year]  Transition of Care [facility]) CM/SW [place], [place] Phone Number: 02/17/2019, 9:34 AM  Clinical Narrative:   CSW spoke with pt's daughter [facility] @ [phone].  CSW gave pt's daughter bed offers for pt in [place].  Pt's daughter inquired about the pt being placed in [place] for SNF.  CSW advised pt's daughter she would have to consult with medical doctor for transport out of state, facilities would have to be agreeable for acceptance to SNF, Pt's daughter stated that she  will contact facilities in [place] for possible placement on Monday. CSW referred daughter to medicare.gov for SNF options.   Expected Discharge Plan: Skilled Nursing Facility Barriers to Discharge: Ship broker, Continued Medical Work up, SNF Pending bed offer  Expected Discharge Plan and Services Expected Discharge Plan: [place] In-house Referral: Clinical Social Work Discharge Planning Services: CM Consult Post Acute Care Choice: [place] arrangements for the past 2 months: [place] Determinants of Health (SDOH) Interventions    Readmission Risk Interventions Readmission Risk Prevention Plan 02/13/2019  Transportation Screening Complete  Medication Review Press photographer) Complete  PCP or Specialist appointment within 3-5 days of discharge Not Complete  PCP/Specialist Appt Not Complete comments likely pt will dc to SNF  [place] or Home Care Consult Complete  SW Recovery Care/Counseling Consult Complete  Palliative Care Screening Not Applicable  Skilled Nursing Facility Complete  Some recent data might be hidden

## 2019-02-17 NOTE — Progress Notes (Signed)
[facility] Progress Note   Dialysis Orders:  Center:[facility] MWF. 2K/ 2.25 Ca; EDW 123.5kg (last post weight +3.4kg); LUE AVF 425/800 Heparin 2000 unit bolus Mircera 18mcg IV q 2 weeks  Renvela 800mg  3 tabs PO TID  Assessment/Plan: 1. SOB - neg UF 3.2 L Wed and 3 L Thursday and 900 ml Friday although post wt Friday was 2 L below pre weight at 118 - post wt 117 Thursday - either way will need much lower wt at d/c EF 35 - 50% with severely dilated right atrium - seen by cards has had since 2014 - intolerant of BB, ACE/ARBs due to  hypotension -; next HD Monday  2. ESRD - MWF -K 4.1 - start on 3 K bath Monday - with parameters to change 3. Anemia - hgb 9.5 stable - weekly ESA next dose to 100 if still here 4. Secondary hyperparathyroidism - binders on hold due to P 2s- hypercalcemia - on nonCa binders/ no VDRA - on 3 renvela - last P 3.8 - previously in the 2s  5. HTN/volume - BP drops on HD limited UF Friday - walks around at home so we need standing wts/orthostatics at his next HD - on  Midodrine for BP support  6. Nutrition - alb 3.1- on vit/supplements 7. Cellulitis/wound- LLE neg osteo on MRI on Vanc/Zosyn BC neg - needs compression stockings per ortho -  8. Prolonged bleeding from AVF - has been happening from both needle sites at home - plan for f'gram on Monday.- no acute need this weekend- INR coming down 1.7 today  9. Afib -coumadin on hold - INR 1.7 on heparin  given 2.5 mg vit K Friday - apparently on brilinta and coumadin prior to admission per-  cardiology dc'd brilinta - no prior stents - repeat INR daily - avoid low potassium on dialysis 10. Sz d/o on keppra 11. DM - per primary 12. Disp - DNR planning SNF - I don't think he is safe to manage himself at home without supervision  Myriam Jacobson, PA-C [facility] [phone] 02/17/2019,7:40 AM  LOS: 7 days   Subjective:  Stooled in bed. Staff cleaning up before breakfast.  This is unusual for him by his report.  Denies pain in legs  Objective Vitals:   02/16/19 0625 02/16/19 1329 02/16/19 2124 02/17/19 0538  BP: (!) 119/102 (!) 99/57 112/68 107/64  Pulse: 95 61 65 68  Resp: 17 17 19 18   Temp: 98.1 F (36.7 C) 98.3 F (36.8 C) 98 F (36.7 C) 98.2 F (36.8 C)  TempSrc: Oral Oral Oral Axillary  SpO2: 100% 94% 97% 96%  Weight:      Height:       Physical Exam General: chronically ill appearing NAD Heart: RRR Lungs: dim BS grossly clear Abdomen: soft NT + BS Extremities: tr - 1 + LE edema -LLE wrapped. Dialysis Access: left upper AVF  + bruit   Additional Objective Labs: Lab Results  Component Value Date   INR 1.7 (H) 02/17/2019   INR 2.1 (H) 02/16/2019   INR 2.1 (H) 09/32/3557    Basic Metabolic Panel: Recent Labs  Lab 02/14/19 0130 02/15/19 0854 02/16/19 0930 02/17/19 0239  NA 135 135 132* 134*  K 3.6 3.7 4.1 4.1  CL 94* 94* 95* 94*  CO2 26 25 24 23   GLUCOSE 123* 103* 135* 105*  BUN 25* 16 27* 37*  CREATININE 5.94* 4.04* 5.34* 6.54*  CALCIUM 9.4 9.3 9.6 9.8  PHOS 2.8 2.5 3.8  --    Liver Function Tests: Recent Labs  Lab 02/07/2019 1000  02/14/19 0130 02/15/19 0854 02/16/19 0930  AST 30  --   --   --   --   ALT 18  --   --   --   --   ALKPHOS 269*  --   --   --   --   BILITOT 1.2  --   --   --   --   PROT 7.9  --   --   --   --   ALBUMIN 3.3*   < > 2.9* 3.3* 3.1*   < > = values in this interval not displayed.   No results for input(s): LIPASE, AMYLASE in the last 168 hours. CBC: Recent Labs  Lab 02/12/2019 1000  02/13/19 0233 02/14/19 0130 02/15/19 0500 02/16/19 0258 02/17/19 0239  WBC 15.7*   < > 11.8* 16.7* 14.8* 13.4* 13.9*  NEUTROABS 13.6*  --   --   --   --   --   --   HGB 10.0*   < > 9.6* 9.3* 9.2* 9.4* 9.5*  HCT 31.5*   < > 29.9* 29.5* 29.4* 29.5* 30.2*  MCV 96.9   < > 95.8 97.0 98.0 96.7 96.2  PLT 180   < > 217 247 254 275 296   < > = values in this interval not displayed.   Blood Culture     Component Value Date/Time   SDES  01/29/2019 0950    BLOOD RIGHT HAND Performed at [facility], [address] [place], [place] [address]    [facility]  01/20/2019 0950    BOTTLES DRAWN AEROBIC AND ANAEROBIC Blood Culture results may not be optimal due to an inadequate volume of blood received in culture bottles Performed at [facility], [address], [place], [place] [address]    CULT  01/17/2019 0950    NO GROWTH 5 DAYS Performed at [facility], [place] [address], [place], [place] [address]    REPTSTATUS 02/15/2019 FINAL 01/18/2019 0950    Cardiac Enzymes: Recent Labs  Lab 01/31/2019 1000  CKTOTAL 198   CBG: Recent Labs  Lab 02/16/19 0252 02/16/19 0757 02/16/19 1223 02/16/19 1659 02/16/19 2121  GLUCAP 108* 111* 121* 103* 109*   Iron Studies: No results for input(s): IRON, TIBC, TRANSFERRIN, FERRITIN in the last 72 hours. Lab Results  Component Value Date   INR 1.7 (H) 02/17/2019   INR 2.1 (H) 02/16/2019   INR 2.1 (H) 02/15/2019   Studies/Results: No results found. Medications: . albumin human    . vancomycin Stopped (02/15/19 1044)   . aspirin EC  81 mg Oral Daily  . atorvastatin  10 mg Oral Daily  . Chlorhexidine Gluconate Cloth  6 each Topical Q0600  . [START ON 2019-02-28] darbepoetin (ARANESP) injection - DIALYSIS  60 mcg Intravenous Q Wed-HD  . feeding supplement (PRO-STAT SUGAR FREE 64)  30 mL Oral BID  . FLUoxetine  20 mg Oral Daily  . insulin aspart  0-15 Units Subcutaneous TID WC  . midodrine  10 mg Oral Q M,W,F-HD  . midodrine  5 mg Oral BID WC  . multivitamin  1 tablet Oral QHS  . mupirocin ointment  1 application Nasal BID  . pantoprazole  40 mg Oral Daily  . vitamin B-12  1,000 mcg Oral Daily

## 2019-02-17 NOTE — Progress Notes (Signed)
PROGRESS NOTE    Bruce Cole  JOI:[id number]  DOB: [birth year]  DOA: 01/26/2019 PCP: Debbrah Alar, NP  Brief Narrative:  Patient is a 78 year old African-American male with past medical history significant for ESRD on MWF HD,chronic hypotension on Midodrin, A. fib on Coumadin; chronic zoster on Valtrex; rheumatoid arthritis on Plaquenil, chronic combined CHF.  Patient was admitted to this hospital in May for falls/possible seizures and started on Keppra by neurology, discharged to acute rehab and then home on 6/17 with home health.  Patient presents with generalized weakness and recurrent falls x3.  Patient presented to the ED after a fall ( slid down to the floor as he was feeling weak and could not unlock the walker while transitioning off commode).  Patient was on the floor for ~5 hours until a caregiver found him.  Patient lives alone, has a nurse come in in the mornings, and son/grandchildren check on him in the evening. During his prior admission, he fell on his hip and was complaining about pain.  He was discharged to [facility] rehab and a scar developed on the back of his leg.  Patient presented to [facility] where he was noted to have a significant area of necrosis on his left posterior calf with foul-smelling discharge and significant edema of most of the LLE.  Patient transferred here and admitted to Hospitalist's service with empiric antibiotics (Vanco/Zosyn) and orthopedics consulted.  MRI obtained on 7/27 showed extensive subcutaneous edema /mild intramuscular edema of the LLE concerning for cellulitis/myositis.  There was no evidence of necrotizing fasciitis or abscess or osteomyelitis.  Patient has also been seen by nephrology and receiving HD TTS since admission.  Hospital course has been complicated by prolonged bleeding from AV fistula postdialysis.  Fistulogram is planned, therefore, patient is currently on heparin and Coumadin is on hold.  02/17/2019: Orthopedic input is  appreciated.  Patient will follow with Dr. Sharol Given on discharge.  Cardiology and PT is appreciated as well.  Patient is currently undergoing hemodialysis.  Patient was actually seen on hemodialysis.  Social work note is appreciated, apparently, patient's daughter wants patient to be placed in skilled nursing facility.  Will complete course of antibiotics.  Likely discharge when a bed is available, and vascular access issues are settled.  Subjective: Patient is a poor historian. Denies any shortness of breath. No chest pain. No fever or chills.  Objective: Vitals:   02/17/19 1300 02/17/19 1330 02/17/19 1345 02/17/19 1355  BP: 95/60 (!) 82/49 99/62 (!) (P) 112/44  Pulse: (!) 59 (!) 50 (!) 55 (!) (P) 53  Resp:    (P) 20  Temp:    (P) 97.6 F (36.4 C)  TempSrc:    (P) Oral  SpO2:    (P) 98%  Weight:      Height:        Intake/Output Summary (Last 24 hours) at 02/17/2019 1425 Last data filed at 02/17/2019 0900 Gross per 24 hour  Intake 480 ml  Output --  Net 480 ml   Filed Weights   02/15/19 0715 02/15/19 1114 02/17/19 1115  Weight: 120 kg 118 kg 128 kg    Physical Examination:  General exam: Patient is obese.  Patient is awake and alert.  Patient is not in any obvious distress.   Respiratory system: Clear to auscultation.  Cardiovascular system: S1 & S2 heard. Gastrointestinal system: Abdomen is obese, soft and nontender.  Organs are difficult to assess.   Central nervous system: Alert and oriented.  Patient moves all  extremities. Extremities: Left lower extremity is bandaged.  Edema of the right lower extremity.              Signed          From Today (8/1) uploaded by dr Sharol Given    I will follow-up as an outpatient.            Data Reviewed: I have personally reviewed following labs and imaging studies  CBC: Recent Labs  Lab 02/13/19 0233 02/14/19 0130 02/15/19 0500 02/16/19 0258 02/17/19 0239  WBC 11.8* 16.7* 14.8* 13.4* 13.9*  HGB 9.6* 9.3* 9.2*  9.4* 9.5*  HCT 29.9* 29.5* 29.4* 29.5* 30.2*  MCV 95.8 97.0 98.0 96.7 96.2  PLT 217 247 254 275 474   Basic Metabolic Panel: Recent Labs  Lab 02/12/19 0257 02/13/19 0233 02/14/19 0130 02/15/19 0854 02/16/19 0930 02/17/19 0239  NA 130* 134* 135 135 132* 134*  K 3.7 3.7 3.6 3.7 4.1 4.1  CL 90* 94* 94* 94* 95* 94*  CO2 21* 22 26 25 24 23   GLUCOSE 126* 118* 123* 103* 135* 105*  BUN 88* 47* 25* 16 27* 37*  CREATININE 11.17* 7.94* 5.94* 4.04* 5.34* 6.54*  CALCIUM 8.9 9.0 9.4 9.3 9.6 9.8  PHOS 4.0 3.0 2.8 2.5 3.8  --    GFR: Estimated Creatinine Clearance: 14.1 mL/min (A) (by C-G formula based on SCr of 6.54 mg/dL (H)). Liver Function Tests: Recent Labs  Lab 02/12/19 0257 02/13/19 0233 02/14/19 0130 02/15/19 0854 02/16/19 0930  ALBUMIN 2.7* 2.7* 2.9* 3.3* 3.1*   No results for input(s): LIPASE, AMYLASE in the last 168 hours. No results for input(s): AMMONIA in the last 168 hours. Coagulation Profile: Recent Labs  Lab 02/12/19 0924 02/14/19 0130 02/15/19 1220 02/16/19 0258 02/17/19 0239  INR 1.6* 2.0* 2.1* 2.1* 1.7*   Cardiac Enzymes: No results for input(s): CKTOTAL, CKMB, CKMBINDEX, TROPONINI in the last 168 hours. BNP (last 3 results) No results for input(s): PROBNP in the last 8760 hours. HbA1C: No results for input(s): HGBA1C in the last 72 hours. CBG: Recent Labs  Lab 02/16/19 0757 02/16/19 1223 02/16/19 1659 02/16/19 2121 02/17/19 0753  GLUCAP 111* 121* 103* 109* 113*   Lipid Profile: No results for input(s): CHOL, HDL, LDLCALC, TRIG, CHOLHDL, LDLDIRECT in the last 72 hours. Thyroid Function Tests: No results for input(s): TSH, T4TOTAL, FREET4, T3FREE, THYROIDAB in the last 72 hours. Anemia Panel: No results for input(s): VITAMINB12, FOLATE, FERRITIN, TIBC, IRON, RETICCTPCT in the last 72 hours. Sepsis Labs: No results for input(s): PROCALCITON, LATICACIDVEN in the last 168 hours.  Recent Results (from the past 240 hour(s))  Blood Culture  (routine x 2)     Status: None   Collection Time: 01/31/2019  9:30 AM   Specimen: BLOOD RIGHT ARM  Result Value Ref Range Status   Specimen Description   Final    BLOOD RIGHT ARM Performed at [facility], [address] [place], [place] [address]    Special Requests   Final    BOTTLES DRAWN AEROBIC AND ANAEROBIC Blood Culture adequate volume Performed at [facility], [address] [place], [place] [address]    Culture   Final    NO GROWTH 5 DAYS Performed at [facility], [place] [address] [place], [place] [address]    Report Status 02/15/2019 FINAL  Final  Blood Culture (routine x 2)     Status: None   Collection Time: 02/08/2019  9:50 AM  Specimen: BLOOD RIGHT HAND  Result Value Ref Range Status   Specimen Description   Final    BLOOD RIGHT HAND Performed at [facility], [address] [place], [place] [address]    Special Requests   Final    BOTTLES DRAWN AEROBIC AND ANAEROBIC Blood Culture results may not be optimal due to an inadequate volume of blood received in culture bottles Performed at [facility], [address] [place], [place] [address]    Culture   Final    NO GROWTH 5 DAYS Performed at [facility], [place] [address], [place], [place] [address]    Report Status 02/15/2019 FINAL  Final  SARS Coronavirus 2 (Performed in [place] lab)     Status: None   Collection Time: 01/22/2019 12:29 PM   Specimen: Nasopharyngeal Swab  Result Value Ref Range Status   SARS Coronavirus 2 NEGATIVE NEGATIVE Final    Comment: (NOTE) If result is NEGATIVE SARS-CoV-2 target nucleic acids are NOT DETECTED. The SARS-CoV-2 RNA is generally detectable in upper and lower  respiratory specimens during the acute phase of infection. The lowest  concentration of SARS-CoV-2 viral copies this assay can detect is 250  copies / mL. A negative result does not preclude SARS-CoV-2 infection  and should not be used as the sole  basis for treatment or other  patient management decisions.  A negative result may occur with  improper specimen collection / handling, submission of specimen other  than nasopharyngeal swab, presence of viral mutation(s) within the  areas targeted by this assay, and inadequate number of viral copies  (<250 copies / mL). A negative result must be combined with clinical  observations, patient history, and epidemiological information. If result is POSITIVE SARS-CoV-2 target nucleic acids are DETECTED. The SARS-CoV-2 RNA is generally detectable in upper and lower  respiratory specimens dur ing the acute phase of infection.  Positive  results are indicative of active infection with SARS-CoV-2.  Clinical  correlation with patient history and other diagnostic information is  necessary to determine patient infection status.  Positive results do  not rule out bacterial infection or co-infection with other viruses. If result is PRESUMPTIVE POSTIVE SARS-CoV-2 nucleic acids MAY BE PRESENT.   A presumptive positive result was obtained on the submitted specimen  and confirmed on repeat testing.  While 2019 novel coronavirus  (SARS-CoV-2) nucleic acids may be present in the submitted sample  additional confirmatory testing may be necessary for epidemiological  and / or clinical management purposes  to differentiate between  SARS-CoV-2 and other Sarbecovirus currently known to infect humans.  If clinically indicated additional testing with an alternate test  methodology [phone]) is advised. The SARS-CoV-2 RNA is generally  detectable in upper and lower respiratory sp ecimens during the acute  phase of infection. The expected result is Negative. Fact Sheet for Patients:  StrictlyIdeas.no Fact Sheet for Healthcare Providers: BankingDealers.co.za This test is not yet approved or cleared by the [place] FDA and has been authorized for detection  and/or diagnosis of SARS-CoV-2 by FDA under an Emergency Use Authorization (EUA).  This EUA will remain in effect (meaning this test can be used) for the duration of the COVID-19 declaration under Section 564(b)(1) of the Act, 21 U.S.C. section 360bbb-3(b)(1), unless the authorization is terminated or revoked sooner. Performed at [facility], [address] [place], [place] [address]   MRSA PCR Screening     Status: Abnormal  Collection Time: 02/15/19  6:18 PM   Specimen: Nasal Mucosa; Nasopharyngeal  Result Value Ref Range Status   MRSA by PCR POSITIVE (A) NEGATIVE Final    Comment:        The GeneXpert MRSA Assay (FDA approved for NASAL specimens only), is one component of a comprehensive MRSA colonization surveillance program. It is not intended to diagnose MRSA infection nor to guide or monitor treatment for MRSA infections. RESULT CALLED TO, READ BACK BY AND VERIFIED WITH: Delfino Lovett RN 20:40 02/15/19 ([email or web address]) Performed at [facility], [place] [address], [place], [place] [address]       Radiology Studies: No results found.      Scheduled Meds:  aspirin EC  81 mg Oral Daily   atorvastatin  10 mg Oral Daily   Chlorhexidine Gluconate Cloth  6 each Topical Q0600   Chlorhexidine Gluconate Cloth  6 each Topical Q0600   [START ON 02-23-2019] darbepoetin (ARANESP) injection - DIALYSIS  100 mcg Intravenous Q Wed-HD   feeding supplement (PRO-STAT SUGAR FREE 64)  30 mL Oral BID   FLUoxetine  20 mg Oral Daily   insulin aspart  0-15 Units Subcutaneous TID WC   midodrine  10 mg Oral Q M,W,F-HD   midodrine  5 mg Oral BID WC   multivitamin  1 tablet Oral QHS   mupirocin ointment  1 application Nasal BID   pantoprazole  40 mg Oral Daily   vitamin B-12  1,000 mcg Oral Daily   Continuous Infusions:  albumin human     vancomycin Stopped (02/15/19 1044)    Assessment & Plan:    1.  Left lower extremity wound necrosis with  cellulitis/myositis:  Admitted, and empirically started on IV Zosyn/vancomycin.  Leukocytosis has gone from 16.7 to 13.4.   No wound culture results visualized.   Patient is still on IV vancomycin (Zosyn has been discontinued)  Orthopedic input is appreciated.   ABI is indicative of which indicate peripheral vascular disease left> than right but with biphasic flow.  Patient now has venous compression wrap of left lower extremity as per Dr. Sharol Given to help reduce venous swelling.  No need for deeper debridement per Dr Sharol Given as improved now and nothing to culture. Plaquenil is on hold due to concerns for acute infection   2.  ESRD/bleeding at AV fistula site:  On hemodialysis TTS to accommodate patient census  (usually on MWF schedule as outpatient).  Prolonged bleeding after hemodialysis as outpatient and while here.   Eventual plan is for fistulogram.   02/17/2019: Nephrology is managing.  Cardiology input is appreciated.  3.  Chronic hypotension:  Likely multifactorial.   Patient has EF of 35 to 40%, with grade ventricular overload/failure  Hypertension can worsen bleeding from AV fistula  Guarded prognosis   4.  Chronic atrial fibrillation:  Holding Coumadin and concern for problem #2.  Hold heparin as well.  Rate controlled, cannot tolerate AV blockers due to problem #3  5.  Recurrent falls,?  Seizures:  He was admitted 5/24, MRI brain no acute changes, EEG showed mild diffuse slowing, no epileptiform discharges. He was diagnosed with uremic encephalopathy and body jerking resolved with discontinuation of Gabapentin. He reports that he was taking more than prescribed due to postherpetic neuralgia. He was seen by Neurology for follow up in June who felt that his confusion and myoclonic jerks were likely due to gabapentin toxicity in the setting of uremic encephalopathy. Repeat EEG was normal this month and neurology tapered  Keppra to off (Per neuro note 7/17 :Keppra to 1/2 daily for one week  then d/c. No additional dose is needed after dialysis). D/Ced Keppra.  PT evaluation, likely will need ALF. 02/17/2019: Falls are likely multifactorial, including but not limited to hemodynamic instability  6.  Acute on chronic combined CHF:  Asymmetrical leg edema likely related to venous insufficiency and CHF.  Not on any other cardiac meds due to hypotension.Patient complained of dyspnea during the hospital course (7/29) and chest x-ray was suggestive of pulmonary edema.Repeat echo shows biventricular systolic dysfunction with EF 35-40%, severely dilated RV/ right atrial enlargement which are not very far from baseline. Appreciate cardiology recommendations.  S/p for dialysis 7/31 as going back to MWF schedule. Off 02 and saturating well on RA. 02/17/2019: Cardiology input is appreciated.  Cardiology is managing.  Symptoms are improving.  7.  Chronic anemia of chronic kidney disease:  Monitor for acute decompensation in the setting of problem #2.  On Aranesp q. weekly.  Also on vitamin B12 supplements. 02/17/2019: Hemoglobin is at goal for ESRD.  8.  Rheumatoid arthritis:  Plaquenil is on hold.   9.  Peripheral vascular disease:  Was on Brilinta/statins.Cardiology changed to aspirin . No h/o cardiac or peripheral vascular stents. 02/17/2019: Follow with vascular surgery on discharge.  10.  Hyperlipidemia:  On Lipitor.  11. Diabetes mellitus type 2:  Sliding scale insulin for now.  On Victoza/Levemir as outpatient.  12 . Anxiety/depression:  On Prozac.   DVT prophylaxis: Heparin Code Status: DNR Family / Patient Communication:  Disposition Plan: Skilled nursing facility.  Patient's daughter wants patient placed in [place].  Social work team is arranging.    LOS: 7 days    Time spent: 35 minutes    Bonnell Public, MD [facility] Pager [phone] If 7PM-7AM, please contact night-coverage [email or web address] Password [email or web address] 02/17/2019, 2:25 PM

## 2019-02-18 LAB — RENAL FUNCTION PANEL
Albumin: 3.1 g/dL — ABNORMAL LOW (ref 3.5–5.0)
Anion gap: 17 — ABNORMAL HIGH (ref 5–15)
BUN: 34 mg/dL — ABNORMAL HIGH (ref 8–23)
CO2: 23 mmol/L (ref 22–32)
Calcium: 9.7 mg/dL (ref 8.9–10.3)
Chloride: 95 mmol/L — ABNORMAL LOW (ref 98–111)
Creatinine, Ser: 6.31 mg/dL — ABNORMAL HIGH (ref 0.61–1.24)
GFR calc Af Amer: 9 mL/min — ABNORMAL LOW (ref >60)
GFR calc non Af Amer: 8 mL/min — ABNORMAL LOW (ref >60)
Glucose, Bld: 121 mg/dL — ABNORMAL HIGH (ref 70–99)
Phosphorus: 4.2 mg/dL (ref 2.5–4.6)
Potassium: 4.2 mmol/L (ref 3.5–5.1)
Sodium: 135 mmol/L (ref 135–145)

## 2019-02-18 LAB — CBC WITH DIFFERENTIAL/PLATELET
Abs Immature Granulocytes: 0.12 10*3/uL — ABNORMAL HIGH (ref 0.00–0.07)
Basophils Absolute: 0 10*3/uL (ref 0.0–0.1)
Basophils Relative: 0 %
Eosinophils Absolute: 0.1 10*3/uL (ref 0.0–0.5)
Eosinophils Relative: 1 %
HCT: 31.1 % — ABNORMAL LOW (ref 39.0–52.0)
Hemoglobin: 9.9 g/dL — ABNORMAL LOW (ref 13.0–17.0)
Immature Granulocytes: 1 %
Lymphocytes Relative: 7 %
Lymphs Abs: 1 10*3/uL (ref 0.7–4.0)
MCH: 30.6 pg (ref 26.0–34.0)
MCHC: 31.8 g/dL (ref 30.0–36.0)
MCV: 96 fL (ref 80.0–100.0)
Monocytes Absolute: 1.1 10*3/uL — ABNORMAL HIGH (ref 0.1–1.0)
Monocytes Relative: 8 %
Neutro Abs: 12.1 10*3/uL — ABNORMAL HIGH (ref 1.7–7.7)
Neutrophils Relative %: 83 %
Platelets: 315 10*3/uL (ref 150–400)
RBC: 3.24 MIL/uL — ABNORMAL LOW (ref 4.22–5.81)
RDW: 19.9 % — ABNORMAL HIGH (ref 11.5–15.5)
WBC: 14.5 10*3/uL — ABNORMAL HIGH (ref 4.0–10.5)
nRBC: 0.6 % — ABNORMAL HIGH (ref 0.0–0.2)

## 2019-02-18 LAB — PROTIME-INR
INR: 1.6 — ABNORMAL HIGH (ref 0.8–1.2)
Prothrombin Time: 19.2 seconds — ABNORMAL HIGH (ref 11.4–15.2)

## 2019-02-18 LAB — GLUCOSE, CAPILLARY
Glucose-Capillary: 147 mg/dL — ABNORMAL HIGH (ref 70–99)
Glucose-Capillary: 87 mg/dL (ref 70–99)
Glucose-Capillary: 91 mg/dL (ref 70–99)
Glucose-Capillary: 102 mg/dL — ABNORMAL HIGH (ref 70–99)

## 2019-02-18 LAB — MAGNESIUM: Magnesium: 2.2 mg/dL (ref 1.7–2.4)

## 2019-02-18 MED ORDER — VANCOMYCIN HCL IN DEXTROSE 500-5 MG/100ML-% IV SOLN
INTRAVENOUS | Status: AC
  Administered 2019-02-18: 500 mg via INTRAVENOUS
  Filled 2019-02-18: qty 100

## 2019-02-18 MED ORDER — VANCOMYCIN HCL IN DEXTROSE 500-5 MG/100ML-% IV SOLN
500.0000 mg | INTRAVENOUS | Status: AC
  Administered 2019-02-18: 500 mg via INTRAVENOUS

## 2019-02-18 MED ORDER — VANCOMYCIN HCL IN DEXTROSE 1-5 GM/200ML-% IV SOLN: 1000.0000 mg | INTRAVENOUS | Status: DC

## 2019-02-18 MED ORDER — VANCOMYCIN HCL IN DEXTROSE 1-5 GM/200ML-% IV SOLN
INTRAVENOUS | Status: AC
  Filled 2019-02-18: qty 200

## 2019-02-18 MED ORDER — MIDODRINE HCL 5 MG PO TABS
ORAL_TABLET | ORAL | Status: AC
  Administered 2019-02-18: 10 mg via ORAL
  Filled 2019-02-18: qty 2

## 2019-02-18 NOTE — Progress Notes (Signed)
Daughter updated 

## 2019-02-18 NOTE — Progress Notes (Signed)
Pt A&O to self and place. Pt is confused at times, refuses meds, assessment, and to be turned sometimes. Pt becomes combative. Pt frequently calls out for help. When I check on him, he tells me to get out of his room he is going to call the police. Sometimes he ask for water,which I give to him. He ask to be put on the bedpan which the techs and I do put him on the bedpan.

## 2019-02-18 NOTE — Progress Notes (Addendum)
[facility] Progress Note   Subjective:   Seen in room. Still reporting some SOB, O2 sat 97%. Occasional cough. Had extra treatment yesterday with net UF 1.7L. Denies orthopnea, CP, palpitations, abdominal pain, N/V/D. Frustrated that he is not allowed to sit on the edge of the bed by himself. Explained that nurses are trying to assist him for his safety.   Objective Vitals:   02/17/19 1355 02/17/19 1502 02/17/19 2014 02/18/19 0610  BP: (!) 112/44 100/70 (!) 92/57 (!) 97/56  Pulse: (!) 53 62 67 79  Resp: 20 18 17 17   Temp: 97.6 F (36.4 C) 97.7 F (36.5 C) 98.1 F (36.7 C) 98.9 F (37.2 C)  TempSrc: Oral Oral Oral Oral  SpO2: 100% 96% 91% 97%  Weight: 117 kg     Height:       Physical Exam General: Chronically ill appearing male, alert and in NAD Heart: RRR, no murmurs,rubs or gallops Lungs: Diminished breath sounds bilateral bases. No wheezing, rhonchi or rales appreciated Abdomen: Soft, non-tender, non-distended. + BS Extremities: Trace edema b/l lower extremities, left leg wrapped Dialysis Access:  LUE AVF + thrill  Additional Objective Labs: Basic Metabolic Panel: Recent Labs  Lab 02/15/19 0854 02/16/19 0930 02/17/19 0239 02/18/19 0346  NA 135 132* 134* 135  K 3.7 4.1 4.1 4.2  CL 94* 95* 94* 95*  CO2 25 24 23 23   GLUCOSE 103* 135* 105* 121*  BUN 16 27* 37* 34*  CREATININE 4.04* 5.34* 6.54* 6.31*  CALCIUM 9.3 9.6 9.8 9.7  PHOS 2.5 3.8  --  4.2   Liver Function Tests: Recent Labs  Lab 02/15/19 0854 02/16/19 0930 02/18/19 0346  ALBUMIN 3.3* 3.1* 3.1*   CBC: Recent Labs  Lab 02/14/19 0130 02/15/19 0500 02/16/19 0258 02/17/19 0239 02/18/19 0346  WBC 16.7* 14.8* 13.4* 13.9* 14.5*  NEUTROABS  --   --   --   --  12.1*  HGB 9.3* 9.2* 9.4* 9.5* 9.9*  HCT 29.5* 29.4* 29.5* 30.2* 31.1*  MCV 97.0 98.0 96.7 96.2 96.0  PLT 247 254 275 296 315   Blood Culture    Component Value Date/Time   SDES  01/24/2019 0950    BLOOD RIGHT  HAND Performed at [facility], [address], [place], [place] [address]    [facility]  01/29/2019 0950    BOTTLES DRAWN AEROBIC AND ANAEROBIC Blood Culture results may not be optimal due to an inadequate volume of blood received in culture bottles Performed at [facility], [address], [place], [place] [address]    CULT  02/02/2019 0950    NO GROWTH 5 DAYS Performed at [facility], [place] [address], [place], [place] [address]    REPTSTATUS 02/15/2019 FINAL 01/28/2019 0950    CBG: Recent Labs  Lab 02/16/19 1659 02/16/19 2121 02/17/19 0753 02/17/19 1644 02/18/19 0813  GLUCAP 103* 109* 113* 141* 147*   Medications: . albumin human    . vancomycin Stopped (02/15/19 1044)   . aspirin EC  81 mg Oral Daily  . atorvastatin  10 mg Oral Daily  . Chlorhexidine Gluconate Cloth  6 each Topical Q0600  . Chlorhexidine Gluconate Cloth  6 each Topical Q0600  . [START ON Mar 10, 2019] darbepoetin (ARANESP) injection - DIALYSIS  100 mcg Intravenous Q Wed-HD  . feeding supplement (PRO-STAT SUGAR FREE 64)  30 mL Oral BID  . FLUoxetine  20 mg Oral Daily  . insulin aspart  0-15 Units Subcutaneous TID  WC  . midodrine  10 mg Oral Q M,W,F-HD  . midodrine  5 mg Oral BID WC  . multivitamin  1 tablet Oral QHS  . mupirocin ointment  1 application Nasal BID  . pantoprazole  40 mg Oral Daily  . vitamin B-12  1,000 mcg Oral Daily    Dialysis Orders: Center:[facility] MWF. 2K/ 2.25 Ca; EDW 123.5kg (last post weight +3.4kg); LUE AVF 425/800 Heparin 2000 unit bolus Mircera 118mcg IV q 2 weeks  Renvela 800mg  3 tabs PO TID  Assessment/Plan: 1. Shortness of breath: CXR pulm edema, echo also showed significant R heart failure and EF 35-40%. Has had multiple extra dialysis treatments this admission for volume, now 6.5kg below outpatient EDW and likely needs to be challenged further. Some SOB on room air. Planned for HD today per regular schedule.   2. Cellulitis:Left lower extremity, seen by ortho.MRI completed without osteomyelitis, plan is for compression per ortho. Per primary/ortho.  3. ESRD:Normally dialyzes MWF at [facility], multiple extra treatments including yesterday. HD today per regular schedule. K+ 4.2.  4. Bleeding from KCM:[id number] bleeding after dialysison 7/28, IV heparin likely contributing. IR has been consulted for fistulogram but could not complete previously due to elevated INR. Off of heparin and brilinta. Fistulogram possibly planned for today.  5. Hypotension:Blood pressure chronically low, has received albumin for blood pressure support during dialysis. On midodrine.BP stable.  Orthostatics ordered with HD today.  6. Anemia:Hemoglobin 9.9.Continuearanesp 51mcg IV q week with dialysis. 7. Metabolic bone disease:Calcium9.7- not on VDRA, use low calcium bath. Last outpatient PTH 241.Phos binders on hold due to low phosphorus, now improved to 4.2.  8. Nutrition:Albumin 3.1. Continue supplements.  9. A. Fib on coumadin: INR subtherapeuticon admission, initially on heparin drip. Per primary. 10. T2DM: Currently in SSI, per primary.He is interested in resuming outpatient meds on discharge if possible.  11.  Seizure disorder:On Keppra 12. Dispo: SNF, per notes daughter is interested in finding and SNF in [place].   Anice Paganini, PA-C 02/18/2019, 10:15 AM  [facility] Pager: [phone]  Pt seen, examined and agree w A/P as above.  Kelly Splinter  MD 02/18/2019, 2:47 PM

## 2019-02-18 NOTE — Care Management Important Message (Signed)
Important Message  Patient Details  Name: Bruce Cole MRN: [id number] Date of Birth: October 19, 1940   Medicare Important Message Given:  Yes     Shelda Altes 02/18/2019, 4:09 PM

## 2019-02-18 NOTE — Progress Notes (Signed)
PT Cancellation Note  Patient Details Name: Bruce Cole MRN: [id number] DOB: 19-May-1941   Cancelled Treatment:    Reason Eval/Treat Not Completed: Patient at procedure or test/unavailable, HD.  Leighton Roach, PT  Acute Rehab Services  Pager [phone] Office [place] 02/18/2019, 12:29 PM

## 2019-02-18 NOTE — TOC Progression Note (Signed)
Transition of Care [facility]) - Progression Note    Patient Details  Name: Bruce Cole MRN: [id number] Date of Birth: Nov 03, 1940  Transition of Care [facility]) CM/SW Charles Mix, [place] Phone Number: 02/18/2019, 1:11 PM  Clinical Narrative:     CSW received a phone call from the patient's daughter, [name]. She stated that she has found a facility in [place] for rehab and that could accommodate her father's hemodialysis. The facility is called [facility]. The Admissions Director is Claudie Revering, and her phone number [phone].   CSW called and spoke with Pam who is covering Terri Piedra, Renal Navigator. CSW explained the situation. Pam stated that she would start working on it but could not find a dialysis center without the patient having a SNF facility. CSW stated that she would keep in touch with her as she secured a facility.   CSW called Claudie Revering with [facility] in [place] left a HIPPA compliant voicemail asking if they had any available SNF beds and if they any dialysis slots. CSW provided contact information and is expecting a return phone call.   CSW will continue to follow and assist with disposition planning.   Expected Discharge Plan: Skilled Nursing Facility Barriers to Discharge: Ship broker, Continued Medical Work up, SNF Pending bed offer  Expected Discharge Plan and Services Expected Discharge Plan: [place] In-house Referral: Clinical Social Work Discharge Planning Services: CM Consult Post Acute Care Choice: [place] arrangements for the past 2 months: [place] Determinants of Health (SDOH) Interventions    Readmission Risk Interventions Readmission Risk Prevention Plan 02/13/2019  Transportation Screening Complete  Medication Review Press photographer) Complete  PCP or Specialist appointment  within 3-5 days of discharge Not Complete  PCP/Specialist Appt Not Complete comments likely pt will dc to SNF  [place] or Home Care Consult Complete  SW Recovery Care/Counseling Consult Complete  Palliative Care Screening Not Applicable  Skilled Nursing Facility Complete  Some recent data might be hidden

## 2019-02-18 NOTE — Progress Notes (Signed)
PROGRESS NOTE    Bruce Cole  HTD:[id number] DOB: 11/02/1940 DOA: 01/18/2019 PCP: Debbrah Alar, NP    Brief Narrative:  Patient is a 78 year old African-American male with past medical history significant for ESRDonMWFHD,chronic hypotension on Midodrin, A. fib on Coumadin;chronic zoster on Valtrex; rheumatoid arthritis on Plaquenil, chronic combined CHF.  Patient was admitted to this hospital in May for falls/possible seizures and started on Keppra by neurology,discharged to acute rehab and thenhome on 6/17 with home health.  Patient presents with generalized weakness and recurrent falls x3.  Patient presented to the ED after a fall ( slid down to the floor as he was feeling weak and could not unlock the walker while transitioning off commode).  Patient was on the floor for ~5 hours until a caregiver found him.  Patient lives alone, has a nurse come in in the mornings, and son/grandchildren check on him in the evening. During his prior admission, he fell on his hip and was complaining about pain. He was discharged to [facility] rehab and a scar developed on the back of his leg.  Patient presented to [facility] where he was noted to have a significant area of necrosis on his left posterior calf with foul-smelling discharge and significant edema of most of the LLE.  Patient transferred here and admitted to Hospitalist's service with empiric antibiotics (Vanco/Zosyn) and orthopedics consulted.  MRI obtained on 7/27 showed extensive subcutaneous edema /mild intramuscular edema of the LLE concerning for cellulitis/myositis.  There was no evidence of necrotizing fasciitis or abscess or osteomyelitis.  Patient has also been seen by nephrology and receiving HD TTS since admission.  Hospital course has been complicated by prolonged bleeding from AV fistula postdialysis.  Fistulogram is planned, therefore, patient is currently on heparin and Coumadin is on hold.  02/18/2019: Orthopedic input is  appreciated.  Patient will follow with Dr. Sharol Given on discharge.  Cardiology and PT is appreciated as well.  Patient seen in hemodialysis.  Social work note is appreciated, apparently, patient's daughter wants patient to be placed in skilled nursing facility.  Will complete course of antibiotics.  Likely discharge when a bed is available, and vascular access issues are settled.  Assessment & Plan:   Principal Problem:   Cellulitis Active Problems:   Essential hypertension   ATRIAL FIBRILLATION   DM (diabetes mellitus), type 2 with renal complications (HCC)   Chronic combined systolic and diastolic heart failure (HCC)   ESRD on dialysis ([place])   Seizure ([place])   Open wound of left lower extremity   Varicose veins of left lower extremity with inflammation, with ulcer of calf limited to breakdown of skin (HCC)   Moderate protein-calorie malnutrition ([place])   1.  Left lower extremity wound necrosis with cellulitis/myositis:  Admitted, and empirically started on IV Zosyn/vancomycin.  Leukocytosis has gone from 16.7 to 13.4.   No wound culture results visualized.   Patient is still on IV vancomycin (Zosyn has been discontinued)  Orthopedic input is appreciated.   ABI is indicative of which indicate peripheral vascular disease left> than right but with biphasic flow.  Patient now has venous compression wrap of left lower extremity as per Dr. Sharol Given to help reduce venous swelling.  No need for deeper debridement per Dr Sharol Given as improved now and nothing to culture. Plaquenil is on hold due to concerns for acute infection. 02/18/2019: Continue IV vancomycin.  Continue to monitor WBC count.  2.  ESRD/bleeding at AV fistula site:  On hemodialysis TTS to accommodate patient census  (  usually on MWF schedule as outpatient).  Prolonged bleeding after hemodialysis as outpatient and while here.   Eventual plan is for fistulogram.   02/18/2019: Nephrology is managing.  Cardiology input is appreciated.  IR  consulted for fistulogram and possible intervention.  3.  Chronic hypotension:  Likely multifactorial.   Patient has EF of 35 to 40%, with grade ventricular overload/failure  Hypertension can worsen bleeding from AV fistula  Guarded prognosis   4.  Chronic atrial fibrillation:  Holding Coumadin and concern for problem #2.  Hold heparin as well.  Rate controlled, cannot tolerate AV blockers due to problem #3  5.  Recurrent falls,?  Seizures:  He was admitted 5/24, MRI brain no acute changes, EEG showed mild diffuse slowing, no epileptiform discharges. He was diagnosed with uremic encephalopathy and body jerking resolved with discontinuation of Gabapentin. He reports that he was taking more than prescribed due to postherpetic neuralgia. He was seen by Neurology for follow up in June who felt that his confusion and myoclonic jerks were likely due to gabapentin toxicity in the setting of uremic encephalopathy. Repeat EEG was normal this month and neurology tapered Keppra to off (Per neuro note 7/17 :Keppra to 1/2 daily for one week then d/c. No additional dose is needed after dialysis). D/Ced Keppra.  PT evaluation, likely will need placement. 02/18/2019: Falls are likely multifactorial, including but not limited to hemodynamic instability  6.  Acute on chronic combined CHF:  Asymmetrical leg edema likely related to venous insufficiency and CHF.  Not on any other cardiac meds due to hypotension.Patient complained of dyspnea during the hospital course (7/29) and chest x-ray was suggestive of pulmonary edema.Repeat echo shows biventricular systolic dysfunction with EF 35-40%, severely dilated RV/ right atrial enlargement which are not very far from baseline. Appreciate cardiology recommendations.  S/p for dialysis 7/31 as going back to MWF schedule. Off 02 and saturating well on RA. 02/17/2019: Cardiology input is appreciated.  Cardiology is managing.  Symptoms are improving. 02/18/2019: Patient receiving  dialysis today.  Continue current medications.  Stable at this time.  7.  Chronic anemia of chronic kidney disease:  Monitor for acute decompensation in the setting of problem #2.  On Aranesp q. weekly.  Also on vitamin B12 supplements. 02/17/2019: Hemoglobin is at goal for ESRD. 02/18/2019: Hemoglobin 9.9.  8.  Rheumatoid arthritis:  Plaquenil is on hold.   9.  Peripheral vascular disease:  Was on Brilinta/statins.Cardiology changed to aspirin . No h/o cardiac or peripheral vascular stents. 02/18/2019: Follow with vascular surgery on discharge.  10.  Hyperlipidemia:  On Lipitor.  11. Diabetes mellitus type 2:  Sliding scale insulin for now.  On Victoza/Levemir as outpatient.  12 . Anxiety/depression:  On Prozac.   DVT prophylaxis: Heparin stopped due to AV fistula bleeding? Code Status: DNR Family / Patient Communication:  Disposition Plan: Skilled nursing facility.  Patient's daughter wants patient placed in [place].  Social work team is arranging.    Consultants:  Cardiology, orthopedics, nephrology, interventional radiology    Antimicrobials:   IV Vancomycin    Subjective: Patient seen in dialysis.  Awake, oriented x2.  Objective: Vitals:   02/18/19 1300 02/18/19 1330 02/18/19 1400 02/18/19 1430  BP: (!) 103/58 (!) 101/59 (!) 115/97 102/64  Pulse: 63 75 64 63  Resp:      Temp:      TempSrc:      SpO2:      Weight:      Height:  Intake/Output Summary (Last 24 hours) at 02/18/2019 1505 Last data filed at 02/18/2019 0859 Gross per 24 hour  Intake 280 ml  Output 2 ml  Net 278 ml   Filed Weights   02/17/19 1115 02/17/19 1355 02/18/19 1225  Weight: 120 kg 117 kg 115 kg    Examination: General exam: Obese male, awake.  Does not appear to be in any acute distress at this time. Respiratory system: Decreased breath sounds lower lobes otherwise clear to auscultation. Cardiovascular system: S1 & S2 heard Gastrointestinal system: Abdomen is obese,  soft and nontender. Normal bowel sounds heard. Central nervous system: Oriented x2, able to move all extremities. Extremities: Left lower extremity dressing in place, has right lower extremity edema   Data Reviewed: I have personally reviewed following labs and imaging studies  CBC: Recent Labs  Lab 02/14/19 0130 02/15/19 0500 02/16/19 0258 02/17/19 0239 02/18/19 0346  WBC 16.7* 14.8* 13.4* 13.9* 14.5*  NEUTROABS  --   --   --   --  12.1*  HGB 9.3* 9.2* 9.4* 9.5* 9.9*  HCT 29.5* 29.4* 29.5* 30.2* 31.1*  MCV 97.0 98.0 96.7 96.2 96.0  PLT 247 254 275 296 220   Basic Metabolic Panel: Recent Labs  Lab 02/13/19 0233 02/14/19 0130 02/15/19 0854 02/16/19 0930 02/17/19 0239 02/18/19 0346  NA 134* 135 135 132* 134* 135  K 3.7 3.6 3.7 4.1 4.1 4.2  CL 94* 94* 94* 95* 94* 95*  CO2 22 26 25 24 23 23   GLUCOSE 118* 123* 103* 135* 105* 121*  BUN 47* 25* 16 27* 37* 34*  CREATININE 7.94* 5.94* 4.04* 5.34* 6.54* 6.31*  CALCIUM 9.0 9.4 9.3 9.6 9.8 9.7  MG  --   --   --   --   --  2.2  PHOS 3.0 2.8 2.5 3.8  --  4.2   GFR: Estimated Creatinine Clearance: 13.8 mL/min (A) (by C-G formula based on SCr of 6.31 mg/dL (H)). Liver Function Tests: Recent Labs  Lab 02/13/19 0233 02/14/19 0130 02/15/19 0854 02/16/19 0930 02/18/19 0346  ALBUMIN 2.7* 2.9* 3.3* 3.1* 3.1*   No results for input(s): LIPASE, AMYLASE in the last 168 hours. No results for input(s): AMMONIA in the last 168 hours. Coagulation Profile: Recent Labs  Lab 02/14/19 0130 02/15/19 1220 02/16/19 0258 02/17/19 0239 02/18/19 0346  INR 2.0* 2.1* 2.1* 1.7* 1.6*   Cardiac Enzymes: No results for input(s): CKTOTAL, CKMB, CKMBINDEX, TROPONINI in the last 168 hours. BNP (last 3 results) No results for input(s): PROBNP in the last 8760 hours. HbA1C: No results for input(s): HGBA1C in the last 72 hours. CBG: Recent Labs  Lab 02/16/19 2121 02/17/19 0753 02/17/19 1644 02/18/19 0813 02/18/19 1208  GLUCAP 109* 113*  141* 147* 87   Lipid Profile: No results for input(s): CHOL, HDL, LDLCALC, TRIG, CHOLHDL, LDLDIRECT in the last 72 hours. Thyroid Function Tests: No results for input(s): TSH, T4TOTAL, FREET4, T3FREE, THYROIDAB in the last 72 hours. Anemia Panel: No results for input(s): VITAMINB12, FOLATE, FERRITIN, TIBC, IRON, RETICCTPCT in the last 72 hours. Sepsis Labs: No results for input(s): PROCALCITON, LATICACIDVEN in the last 168 hours.  Recent Results (from the past 240 hour(s))  Blood Culture (routine x 2)     Status: None   Collection Time: 01/25/2019  9:30 AM   Specimen: BLOOD RIGHT ARM  Result Value Ref Range Status   Specimen Description   Final    BLOOD RIGHT ARM Performed at [facility], [address] [place], [place] [address]    Special Requests   Final    BOTTLES DRAWN AEROBIC AND ANAEROBIC Blood Culture adequate volume Performed at [facility], [address] [place], [place] [address]    Culture   Final    NO GROWTH 5 DAYS Performed at [facility], [place] [address] [place], [place] [address]    Report Status 02/15/2019 FINAL  Final  Blood Culture (routine x 2)     Status: None   Collection Time: 02/04/2019  9:50 AM   Specimen: BLOOD RIGHT HAND  Result Value Ref Range Status   Specimen Description   Final    BLOOD RIGHT HAND Performed at [facility], [address] [place], [place] [address]    Special Requests   Final    BOTTLES DRAWN AEROBIC AND ANAEROBIC Blood Culture results may not be optimal due to an inadequate volume of blood received in culture bottles Performed at [facility], [address] [place], [place] [address]    Culture   Final    NO GROWTH 5 DAYS Performed at [facility], [address] [place], [place] [address]    Report Status 02/15/2019 FINAL  Final  SARS Coronavirus 2 (Performed in [place] hospital lab)     Status: None   Collection Time: 02/13/2019 12:29 PM   Specimen:  Nasopharyngeal Swab  Result Value Ref Range Status   SARS Coronavirus 2 NEGATIVE NEGATIVE Final    Comment: (NOTE) If result is NEGATIVE SARS-CoV-2 target nucleic acids are NOT DETECTED. The SARS-CoV-2 RNA is generally detectable in upper and lower  respiratory specimens during the acute phase of infection. The lowest  concentration of SARS-CoV-2 viral copies this assay can detect is 250  copies / mL. A negative result does not preclude SARS-CoV-2 infection  and should not be used as the sole basis for treatment or other  patient management decisions.  A negative result may occur with  improper specimen collection / handling, submission of specimen other  than nasopharyngeal swab, presence of viral mutation(s) within the  areas targeted by this assay, and inadequate number of viral copies  (<250 copies / mL). A negative result must be combined with clinical  observations, patient history, and epidemiological information. If result is POSITIVE SARS-CoV-2 target nucleic acids are DETECTED. The SARS-CoV-2 RNA is generally detectable in upper and lower  respiratory specimens dur ing the acute phase of infection.  Positive  results are indicative of active infection with SARS-CoV-2.  Clinical  correlation with patient history and other diagnostic information is  necessary to determine patient infection status.  Positive results do  not rule out bacterial infection or co-infection with other viruses. If result is PRESUMPTIVE POSTIVE SARS-CoV-2 nucleic acids MAY BE PRESENT.   A presumptive positive result was obtained on the submitted specimen  and confirmed on repeat testing.  While 2019 novel coronavirus  (SARS-CoV-2) nucleic acids may be present in the submitted sample  additional confirmatory testing may be necessary for epidemiological  and / or clinical management purposes  to differentiate between  SARS-CoV-2 and other Sarbecovirus currently known to infect humans.  If clinically  indicated additional testing with an alternate test  methodology [phone]) is advised. The SARS-CoV-2 RNA is generally  detectable in upper and lower respiratory sp ecimens during the acute  phase of infection. The expected result is Negative. Fact Sheet for Patients:  StrictlyIdeas.no Fact Sheet for Healthcare Providers:  BankingDealers.co.za This test is not yet approved or cleared by the [place] and has been authorized for detection and/or diagnosis of SARS-CoV-2 by FDA under an Emergency Use Authorization (EUA).  This EUA will remain in effect (meaning this test can be used) for the duration of the COVID-19 declaration under Section 564(b)(1) of the Act, 21 U.S.C. section 360bbb-3(b)(1), unless the authorization is terminated or revoked sooner. Performed at [facility], [address] [place], [place] [address]   MRSA PCR Screening     Status: Abnormal   Collection Time: 02/15/19  6:18 PM   Specimen: Nasal Mucosa; Nasopharyngeal  Result Value Ref Range Status   MRSA by PCR POSITIVE (A) NEGATIVE Final    Comment:        The GeneXpert MRSA Assay (FDA approved for NASAL specimens only), is one component of a comprehensive MRSA colonization surveillance program. It is not intended to diagnose MRSA infection nor to guide or monitor treatment for MRSA infections. RESULT CALLED TO, READ BACK BY AND VERIFIED WITH: Delfino Lovett RN 20:40 02/15/19 ([email or web address]) Performed at [facility], [place] [address] [place], [place] [address]          Radiology Studies: No results found.      Scheduled Meds: . aspirin EC  81 mg Oral Daily  . atorvastatin  10 mg Oral Daily  . Chlorhexidine Gluconate Cloth  6 each Topical Q0600  . Chlorhexidine Gluconate Cloth  6 each Topical Q0600  . [START ON 17-Mar-2019] darbepoetin (ARANESP) injection - DIALYSIS  100 mcg Intravenous Q Wed-HD  . feeding supplement (PRO-STAT SUGAR  FREE 64)  30 mL Oral BID  . FLUoxetine  20 mg Oral Daily  . insulin aspart  0-15 Units Subcutaneous TID WC  . midodrine  10 mg Oral Q M,W,F-HD  . midodrine  5 mg Oral BID WC  . multivitamin  1 tablet Oral QHS  . mupirocin ointment  1 application Nasal BID  . pantoprazole  40 mg Oral Daily  . vancomycin  500 mg Intravenous Q Mon-HD  . vitamin B-12  1,000 mcg Oral Daily   Continuous Infusions: . albumin human    . vancomycin    . [START ON 2019/03/17] vancomycin       LOS: 8 days    Time spent: 35 min    Yaakov Guthrie, MD [facility] Pager on [place]  If 7PM-7AM, please contact night-coverage [email or web address] Password [email or web address] 02/18/2019, 3:05 PM

## 2019-02-18 NOTE — Consult Note (Signed)
Chief Complaint: Patient was seen in consultation today for LUE dialysis fistulogram; possible intervention Chief Complaint  Patient presents with   Fall   at the request of Dr Jonnie Finner   Supervising Physician: Corrie Mckusick  Patient Status: [facility] - In-pt  History of Present Illness: Bruce Cole is a 78 y.o. male   ESRD Left upper arm dialysis fistula Slow flow- persistent bleeding at fistula after dialysis Noted few sessions ago  Request made for fistulogram with possible intervention to be performed in IR INR 1.6 today Pt is on way to dialysis now  Past Medical History:  Diagnosis Date   Acute blood loss anemia    Atrial fibrillation (HCC)    Chronic   Chronic combined systolic and diastolic heart failure ([place])    Colon polyp 2000   Dysrhythmia    hx   ESRD on hemodialysis ([place])    Hemo TTHS- [place]   Essential hypertension    Gastritis and gastroduodenitis    with bleeding   Gout    Heart murmur    Herpes    History of blood transfusion    Nephrolithiasis    OSA (obstructive sleep apnea) 09/02/2013    IMPRESSION :  1. Mild obstructive sleep apnea with hypopneas causing sleep fragmentation and moderate oxygen desaturation.  2. Short runs of nonsustained VT were noted. His beta blocker may need to be titrated 3. Significant PLM's were noted, the PLM arousal index was low. Please correlate with clinical history of restless leg syndrome.  4. Sleep efficiency was poor.  RECOMMENDATION:  1. Treatment options for this degree of sleep disordered breathing include weight loss and positional therapy to avoid supine sleep 2. Consider titrating beta blocker further, defer to cardiologist 3. Patient should be cautioned against driving when sleepy.They should be asked to avoid medications with sedative side effects      Osteoarthritis of right hip    Pneumonia    hx 30 yrs ago   Primary osteoarthritis of right hip    Rheumatoid arthritis  ([place])    Seizures ([place])    Thrombocytopenia ([place])     Past Surgical History:  Procedure Laterality Date   AV FISTULA PLACEMENT Left 08/26/2015   Procedure: LEFT RADIOCEPHALIC FISTULA CREATION;  Surgeon: Rosetta Posner, MD;  Location[facility];  Service: Vascular;  Laterality: Left;   AV FISTULA PLACEMENT Left 11/23/2015   Procedure: ARTERIOVENOUS (AV) FISTULA CREATION;  Surgeon: Rosetta Posner, MD;  Location: [facility];  Service: Vascular;  Laterality: Left;   BACK SURGERY     x2- discectomy   CHOLECYSTECTOMY  1994   CYSTOSCOPY/RETROGRADE/URETEROSCOPY/STONE EXTRACTION WITH BASKET     ESOPHAGOGASTRODUODENOSCOPY N/A 11/13/2015   Procedure: ESOPHAGOGASTRODUODENOSCOPY (EGD);  Surgeon: Irene Shipper, MD;  Location[facility] ENDOSCOPY;  Service: Endoscopy;  Laterality: N/A;   INSERTION OF DIALYSIS CATHETER Left 11/23/2015   Procedure: INSERTION OF DIALYSIS CATHETER;  Surgeon: Rosetta Posner, MD;  Location: [facility];  Service: Vascular;  Laterality: Left;   IR GENERIC HISTORICAL Left 08/04/2016   IR DIALY SHUNT INTRO NEEDLE/INTRACATH INITIAL W/IMG LEFT 08/04/2016 Markus Daft, MD [facility]-INTERV RAD   IR GENERIC HISTORICAL  08/04/2016   IR US GUIDE VASC ACCESS LEFT 08/04/2016 Markus Daft, MD [facility]   JOINT REPLACEMENT     Total L-Hip replacement, Right Knee 10/20/09   LEFT AND RIGHT HEART CATHETERIZATION WITH CORONARY ANGIOGRAM N/A 02/22/2013   Procedure: LEFT AND RIGHT HEART CATHETERIZATION WITH CORONARY ANGIOGRAM;  Surgeon: Shaune Pascal Bensimhon,  MD;  Location: [facility] CATH LAB;  Service: Cardiovascular;  Laterality: N/A;   LITHOTRIPSY  90's   PERIPHERAL VASCULAR CATHETERIZATION Left 11/19/2015   Procedure: A/V/Fistulagram;  Surgeon: Conrad Gerald, MD;  Location: [facility] CV LAB;  Service: Cardiovascular;  Laterality: Left;   PERIPHERAL VASCULAR CATHETERIZATION Left 07/21/2016   Procedure: A/V Fistulagram;  Surgeon: Conrad Jarrell, MD;  Location: [facility] CV LAB;  Service: Cardiovascular;  Laterality: Left;  arm   REVISON  OF ARTERIOVENOUS FISTULA Left 05/30/2016   Procedure: REVISION LEFT UPPER ARM FISTULA;  Surgeon: Conrad , MD;  Location: [facility];  Service: Vascular;  Laterality: Left;   REVISON OF ARTERIOVENOUS FISTULA Left 07/22/2016   Procedure: REVISON OF BASILIC VEIN TRANSPOSITION ANASTOMOSIS;  Surgeon: Rosetta Posner, MD;  Location: [facility];  Service: Vascular;  Laterality: Left;   SPINE SURGERY     x 2   TOTAL HIP ARTHROPLASTY Right 03/22/2016   Procedure: RIGHT TOTAL HIP ARTHROPLASTY ANTERIOR APPROACH;  Surgeon: Mcarthur Rossetti, MD;  Location: [facility];  Service: Orthopedics;  Laterality: Right;    Allergies: Neurontin [gabapentin] and Ace inhibitors  Medications: Prior to Admission medications   Medication Sig Start Date End Date Taking? Authorizing Provider  albuterol (VENTOLIN HFA) 108 (90 Base) MCG/ACT inhaler Inhale 1 puff into the lungs every 4 (four) hours as needed for wheezing or shortness of breath.   Yes [provider]  atorvastatin (LIPITOR) 10 MG tablet TAKE 1 TABLET(10 MG) BY MOUTH DAILY Patient taking differently: Take 10 mg by mouth daily.  12/12/18  Yes Debbrah Alar, NP  B Complex-C-Folic Acid (RENA-VITE RX) 1 MG TABS Take 1 tablet by mouth at bedtime.  02/18/16  Yes [provider]  colchicine 0.6 MG tablet Take 1/2 tablet by mouth once as needed for gout flare. Call if no improvement 01/02/18  Yes Debbrah Alar, NP  dorzolamide (TRUSOPT) 2 % ophthalmic solution Place 1 drop into both eyes 2 (two) times daily.   Yes [provider]  FLUoxetine (PROZAC) 20 MG capsule Take 20 mg by mouth daily.   Yes [provider]  hydroxychloroquine (PLAQUENIL) 200 MG tablet Take 200 mg by mouth daily.   Yes [provider]  hydrOXYzine (ATARAX/VISTARIL) 25 MG tablet Take 1 tablet (25 mg total) by mouth every 8 (eight) hours as needed for itching. 12/13/18  Yes Mikhail, Velta Addison, DO  insulin detemir (LEVEMIR) 100 UNIT/ML injection Inject 15  Units into the skin daily.   Yes [provider]  latanoprost (XALATAN) 0.005 % ophthalmic solution Place 1 drop into both eyes at bedtime.   Yes [provider]  levETIRAcetam (KEPPRA) 500 MG tablet Take 1 tablet by mouth once daily after dialysis (only take on dialysis days) 01/10/19  Yes Debbrah Alar, NP  liraglutide (VICTOZA) 18 MG/3ML SOPN Inject 0.6 mg into the skin daily.   Yes [provider]  midodrine (PROAMATINE) 10 MG tablet Take 1 tablet (10 mg total) by mouth every Monday, Wednesday, and Friday with hemodialysis. 12/13/18  Yes Mikhail, Velta Addison, DO  midodrine (PROAMATINE) 5 MG tablet Take 1 tablet (5 mg total) by mouth 2 (two) times daily with a meal. 12/13/18  Yes Mikhail, Velta Addison, DO  omeprazole (PRILOSEC OTC) 20 MG tablet Take 20 mg by mouth daily as needed (heartburn).   Yes [provider]  pantoprazole (PROTONIX) 40 MG tablet Take 1 tablet (40 mg total) by mouth daily. Patient taking differently: Take 20 mg by mouth daily.  02/07/19  Yes Debbrah Alar, NP  RENVELA 800 MG tablet Take 3 tablets (2,400 mg total) by mouth 3 (three) times daily with meals. 07/23/16  Yes Lavina Hamman, MD  ticagrelor (BRILINTA) 90 MG TABS tablet Take 90 mg by mouth 2 (two) times daily. As directed   Yes [provider]  vitamin B-12 (CYANOCOBALAMIN) 1000 MCG tablet Take 1,000 mcg by mouth daily.   Yes [provider]  warfarin (COUMADIN) 5 MG tablet Take 5 mg by mouth daily.  12/31/18  Yes [provider]  ferric citrate (AURYXIA) 1 GM 210 MG(Fe) tablet Take 2 tablets (420 mg total) by mouth 3 (three) times daily with meals. Patient not taking: Reported on 02/11/2019 12/13/18   Cristal Ford, DO  Nutritional Supplements (FEEDING SUPPLEMENT, NEPRO CARB STEADY,) LIQD Take 237 mLs by mouth 3 (three) times daily as needed (Supplement). Patient not taking: Reported on 01/08/2019 12/13/18   Cristal Ford, DO     Family History    Problem Relation Age of Onset   Hypertension Mother    Arthritis Mother        ?RA   Hypertension Father     Social History   Socioeconomic History   Marital status: Single    Spouse name: Not on file   Number of children: 3   Years of education: Not on file   Highest education level: Not on file  Occupational History   Occupation: retired Personnel officer: [employer] resource strain: Not on file   Food insecurity    Worry: Not on file    Inability: Not on file   Transportation needs    Medical: Not on file    Non-medical: Not on file  Tobacco Use   Smoking status: Never Smoker   Smokeless tobacco: Never Used  Substance and Sexual Activity   Alcohol use: No    Comment: occasional   Drug use: No   Sexual activity: Not on file  Lifestyle   Physical activity    Days per week: Not on file    Minutes per session: Not on file   Stress: Not on file  Relationships   Social connections    Talks on phone: Not on file    Gets together: Not on file    Attends religious service: Not on file    Active member of club or organization: Not on file    Attends meetings of clubs or organizations: Not on file    Relationship status: Not on file  Other Topics Concern   Not on file  Social History Narrative   Former [employer] engineer and [employer]   Admitted to [facility] 11/25/15   Widowed   Never smoked   FULL CODE   Right handed    2 level home stays on 1st level     Review of Systems: A 12 point ROS discussed and pertinent positives are indicated in the HPI above.  All other systems are negative.  Review of Systems  Constitutional: Positive for activity change, appetite change and fatigue. Negative for fever.  Respiratory: Positive for shortness of breath. Negative for stridor.   Cardiovascular: Positive for leg swelling. Negative for chest pain.  Gastrointestinal: Negative for nausea and vomiting.   Musculoskeletal: Positive for gait problem.  Neurological: Positive for weakness.  Psychiatric/Behavioral: Negative for behavioral problems and confusion.    Vital Signs: BP (!) 97/56    Pulse 79  Temp 98.9 F (37.2 C) (Oral)    Resp 17    Ht 6\' 3"  (1.905 m)    Wt 257 lb 15 oz (117 kg)    SpO2 97%    BMI 32.24 kg/m   Physical Exam Vitals signs reviewed.  Cardiovascular:     Rate and Rhythm: Normal rate and regular rhythm.  Pulmonary:     Breath sounds: Normal breath sounds.  Musculoskeletal: Normal range of motion.     Comments: LUE fistula +pulse Weak thrill  Skin:    General: Skin is warm.  Neurological:     Mental Status: He is alert and oriented to person, place, and time.  Psychiatric:        Behavior: Behavior normal.     Imaging: Dg Chest 2 View  Result Date: 02/13/2019 CLINICAL DATA:  Shortness of breath EXAM: CHEST - 2 VIEW COMPARISON:  01/18/2019 FINDINGS: Moderate cardiomegaly, unchanged. Calcific thoracic aorta. Mildly prominent perihilar interstitial markings. No new focal airspace consolidation. No large pleural fluid collection. No pneumothorax. IMPRESSION: Cardiomegaly with mild perihilar interstitial opacities suggesting CHF/fluid overload. Electronically Signed   By: Davina Poke M.D.   On: 02/13/2019 12:56   Mr Tibia Fibula Left Wo Contrast  Result Date: 02/11/2019 CLINICAL DATA:  Left lower extremity redness, swelling EXAM: MRI OF LOWER LEFT EXTREMITY WITHOUT CONTRAST TECHNIQUE: Multiplanar, multisequence MR imaging of the left lower extremity from the proximal tibial metaphysis to the hindfoot was performed. No intravenous contrast was administered. COMPARISON:  None. FINDINGS: Bones/Joint/Cartilage No acute fracture. No malalignment. No abnormal cortical signal or rotation. No bone marrow edema. Mild degenerative changes of the tibiotalar and subtalar joints. No focal talar dome osteochondral lesion. Ligaments Grossly intact, suboptimally evaluated.  Muscles and Tendons There is extensive atrophy and fatty infiltration throughout the lower leg musculature. Mild edema-like intramuscular signal most pronounced within the flexor hallucis longus and soleus muscles. No intramuscular fluid collections. Mild thickening and intermediate signal within the distal left Achilles tendon without tear. Remaining ankle tendons appear grossly intact. No significant tenosynovial fluid. Soft tissues Near circumferential subcutaneous edema, most pronounced over the anteromedial aspect of the distal tibial metadiaphysis. No well-defined or drainable fluid collections. No significant deep fascial edema. The included portions of the contralateral right lower extremity reveals similar findings. IMPRESSION: 1. Extensive subcutaneous edema and mild intramuscular edema of the left lower extremity suggesting a nonspecific cellulitis and myositis. No well-defined or drainable fluid collections. No significant deep fascial edema. 2. No marrow signal abnormality to suggest acute osteomyelitis. 3. Similar findings are identified within the visualized portion of the contralateral right lower extremity. 4. Left Achilles tendinosis without tear. Electronically Signed   By: Davina Poke M.D.   On: 02/11/2019 16:54   Dg Chest Port 1 View  Result Date: 02/14/2019 CLINICAL DATA:  78 year old male with history of unwitnessed fall today. Generalized fatigue. EXAM: PORTABLE CHEST 1 VIEW COMPARISON:  Chest x-ray 12/09/2018. FINDINGS: Lung volumes are normal. No consolidative airspace disease. No pleural effusions. No evidence of pulmonary edema. Moderate cardiomegaly (unchanged). Upper mediastinal contours are within normal limits. Aortic atherosclerosis. IMPRESSION: 1.  No radiographic evidence of acute cardiopulmonary disease. 2. Moderate cardiomegaly (unchanged). 3. Aortic atherosclerosis. Electronically Signed   By: Vinnie Langton M.D.   On: 01/28/2019 09:53   Vas [place] Burnard Bunting With/wo  Tbi  Result Date: 02/12/2019 LOWER EXTREMITY DOPPLER STUDY Indications: Gangrene. High Risk Factors: Hypertension, Diabetes.  Limitations: patient movement Comparison Study: No prior studies. Performing Technologist: Carlos Levering Rvt  Examination  Guidelines: A complete evaluation includes at minimum, Doppler waveform signals and systolic blood pressure reading at the level of bilateral brachial, anterior tibial, and posterior tibial arteries, when vessel segments are accessible. Bilateral testing is considered an integral part of a complete examination. Photoelectric Plethysmograph (PPG) waveforms and toe systolic pressure readings are included as required and additional duplex testing as needed. Limited examinations for reoccurring indications may be performed as noted.  ABI Findings: +--------+------------------+-----+---------+--------+  Right    Rt Pressure (mmHg) Index Waveform  Comment   +--------+------------------+-----+---------+--------+  Brachial 102                      triphasic           +--------+------------------+-----+---------+--------+  PTA      102                1.00  biphasic            +--------+------------------+-----+---------+--------+  DP       143                1.40  biphasic            +--------+------------------+-----+---------+--------+ +---------+------------------+-----+--------+-------+  Left      Lt Pressure (mmHg) Index Waveform Comment  +---------+------------------+-----+--------+-------+  PTA       211                2.07  biphasic          +---------+------------------+-----+--------+-------+  DP        244                2.39  biphasic          +---------+------------------+-----+--------+-------+  Great Toe 53                 0.52                    +---------+------------------+-----+--------+-------+ +-------+-----------+-----------+------------+------------+  ABI/TBI Today's ABI Today's TBI Previous ABI Previous TBI   +-------+-----------+-----------+------------+------------+  Right   1.4                                                +-------+-----------+-----------+------------+------------+  Left    2.39        0.52                                   +-------+-----------+-----------+------------+------------+  Summary: Right: Resting right ankle-brachial index indicates noncompressible right lower extremity arteries. Unable to obtain TBI due to low amplitude waveforms. Left: Resting left ankle-brachial index indicates noncompressible left lower extremity arteries.The left toe-brachial index is abnormal.  *See table(s) above for measurements and observations.  Electronically signed by Deitra Mayo MD on 02/12/2019 at 5:52:38 PM.   Final     Labs:  CBC: Recent Labs    02/15/19 0500 02/16/19 0258 02/17/19 0239 02/18/19 0346  WBC 14.8* 13.4* 13.9* 14.5*  HGB 9.2* 9.4* 9.5* 9.9*  HCT 29.4* 29.5* 30.2* 31.1*  PLT 254 275 296 315    COAGS: Recent Labs    02/06/2019 1046  02/15/19 1220 02/16/19 0258 02/17/19 0239 02/18/19 0346  INR 1.5*   < > 2.1* 2.1* 1.7* 1.6*  APTT 42*  --   --   --   --   --    < > =  values in this interval not displayed.    BMP: Recent Labs    02/15/19 0854 02/16/19 0930 02/17/19 0239 02/18/19 0346  NA 135 132* 134* 135  K 3.7 4.1 4.1 4.2  CL 94* 95* 94* 95*  CO2 25 24 23 23   GLUCOSE 103* 135* 105* 121*  BUN 16 27* 37* 34*  CALCIUM 9.3 9.6 9.8 9.7  CREATININE 4.04* 5.34* 6.54* 6.31*  GFRNONAA 14* 10* 8* 8*  GFRAA 16* 11* 9* 9*    LIVER FUNCTION TESTS: Recent Labs    12/04/18 1647 12/09/18 0728 12/10/18 0355  02/03/2019 1000  02/14/19 0130 02/15/19 0854 02/16/19 0930 02/18/19 0346  BILITOT 0.5 0.6 0.2*  --  1.2  --   --   --   --   --   AST 29 25 18   --  30  --   --   --   --   --   ALT 15 15 15   --  18  --   --   --   --   --   ALKPHOS 306* 282* 265*  --  269*  --   --   --   --   --   PROT 8.2* 8.0 7.0  --  7.9  --   --   --   --   --   ALBUMIN  3.2* 3.1* 2.8*   < > 3.3*   < > 2.9* 3.3* 3.1* 3.1*   < > = values in this interval not displayed.    TUMOR MARKERS: No results for input(s): AFPTM, CEA, CA199, CHROMGRNA in the last 8760 hours.  Assessment and Plan:  LUE fistula Persistent bleeding after dialysis MD concerned for stenosis Scheduled for fistulogram with possible intervention  Plan for 8/4 in IR Pt on way to dialysis now He is aware of procedure benefits and risks including but not limited to: infection; bleeding; damage to fistula Agreeable to proceed' Consent signed in chart  Thank you for this interesting consult.  I greatly enjoyed meeting Bruce Cole and look forward to participating in their care.  A copy of this report was sent to the requesting provider on this date.  Electronically Signed: Lavonia Drafts, PA-C 02/18/2019, 12:05 PM   I spent a total of 20 Minutes    in face to face in clinical consultation, greater than 50% of which was counseling/coordinating care for LUE fistula evaluation and possible intervention

## 2019-02-18 NOTE — Plan of Care (Signed)
  Problem: Activity: Goal: Risk for activity intolerance will decrease Outcome: Not Progressing   Problem: Nutrition: Goal: Adequate nutrition will be maintained Outcome: Not Progressing   

## 2019-02-18 NOTE — Progress Notes (Signed)
Pharmacy Antibiotic Note  Bruce Cole is a 78 y.o. male admitted on 02/01/2019 with LLE cellulitis and necrosis.  Pharmacy has been consulted for Vancomycin dosing.   A post-HD on 7/31 was elevated at 30 mcg/ml. The patient received a short HD session on 8/2 with a full maintenance dose given. Will give a reduced dose today and check a Vancomycin random in the AM.   Plan: - Vancomycin 500 mg IV x 1 post HD today (dose change discussed with HD-RN) - Obtain a Vancomycin random level in the AM for monitoring - Will continue to follow HD schedule/duration, culture results, LOT, and antibiotic de-escalation plans    Height: 6\' 3"  (190.5 cm) Weight: 253 lb 8.5 oz (115 kg) IBW/kg (Calculated) : 84.5  Temp (24hrs), Avg:98.3 F (36.8 C), Min:97.7 F (36.5 C), Max:98.9 F (37.2 C)  Recent Labs  Lab 02/14/19 0130 02/15/19 0500 02/15/19 0854 02/15/19 1220 02/16/19 0258 02/16/19 0930 02/17/19 0239 02/18/19 0346  WBC 16.7* 14.8*  --   --  13.4*  --  13.9* 14.5*  CREATININE 5.94*  --  4.04*  --   --  5.34* 6.54* 6.31*  VANCORANDOM  --   --   --  30  --   --   --   --     Estimated Creatinine Clearance: 13.8 mL/min (A) (by C-G formula based on SCr of 6.31 mg/dL (H)).    Allergies  Allergen Reactions  . Neurontin [Gabapentin] Other (See Comments)    Encephalopathy with myoclonic jerking  . Ace Inhibitors Other (See Comments)    Worsening renal insufficiency    Thank you for allowing pharmacy to be a part of this patient's care.  Alycia Rossetti, PharmD, BCPS Clinical Pharmacist Clinical phone for 02/18/2019: [phone] 02/18/2019 3:08 PM   **Pharmacist phone directory can now be found on [email or web address] ([email or web address]).  Listed under [place].

## 2019-02-19 LAB — CBC
HCT: 31.5 % — ABNORMAL LOW (ref 39.0–52.0)
Hemoglobin: 9.9 g/dL — ABNORMAL LOW (ref 13.0–17.0)
MCH: 30.6 pg (ref 26.0–34.0)
MCHC: 31.4 g/dL (ref 30.0–36.0)
MCV: 97.2 fL (ref 80.0–100.0)
Platelets: 326 10*3/uL (ref 150–400)
RBC: 3.24 MIL/uL — ABNORMAL LOW (ref 4.22–5.81)
RDW: 20 % — ABNORMAL HIGH (ref 11.5–15.5)
WBC: 15.1 10*3/uL — ABNORMAL HIGH (ref 4.0–10.5)
nRBC: 0.7 % — ABNORMAL HIGH (ref 0.0–0.2)

## 2019-02-19 LAB — GLUCOSE, CAPILLARY
Glucose-Capillary: 103 mg/dL — ABNORMAL HIGH (ref 70–99)
Glucose-Capillary: 123 mg/dL — ABNORMAL HIGH (ref 70–99)
Glucose-Capillary: 114 mg/dL — ABNORMAL HIGH (ref 70–99)
Glucose-Capillary: 123 mg/dL — ABNORMAL HIGH (ref 70–99)

## 2019-02-19 LAB — BASIC METABOLIC PANEL
Anion gap: 15 (ref 5–15)
BUN: 21 mg/dL (ref 8–23)
CO2: 25 mmol/L (ref 22–32)
Calcium: 9.4 mg/dL (ref 8.9–10.3)
Chloride: 97 mmol/L — ABNORMAL LOW (ref 98–111)
Creatinine, Ser: 4.52 mg/dL — ABNORMAL HIGH (ref 0.61–1.24)
GFR calc Af Amer: 14 mL/min — ABNORMAL LOW (ref >60)
GFR calc non Af Amer: 12 mL/min — ABNORMAL LOW (ref >60)
Glucose, Bld: 113 mg/dL — ABNORMAL HIGH (ref 70–99)
Potassium: 4.2 mmol/L (ref 3.5–5.1)
Sodium: 137 mmol/L (ref 135–145)

## 2019-02-19 LAB — VANCOMYCIN, RANDOM: Vancomycin Rm: 26

## 2019-02-19 NOTE — Progress Notes (Signed)
PT Cancellation Note  Patient Details Name: Bruce Cole MRN: [id number] DOB: 03-Dec-1940   Cancelled Treatment:    Reason Eval/Treat Not Completed: Fatigue/lethargy limiting ability to participate; pt declining due to fatigue, states maybe tomorrow; pt reports, "I call every day... they won't let me walk." Pt required maxA+2 for bed mobility during initial PT evaluation. Pt demonstrating decreased awareness, disoriented to situation. After speaking with Social Worker, I think pt would benefit from a goals of care meeting. Will continue to follow acutely.  Mabeline Caras, PT, DPT Acute Rehabilitation Services  Pager [phone] Office [place] 02/19/2019, 11:30 AM

## 2019-02-19 NOTE — Progress Notes (Addendum)
PROGRESS NOTE    Bruce Cole  MCN:[id number] DOB: January 19, 1941 DOA: 01/30/2019 PCP: Debbrah Alar, NP    Brief Narrative:  Patient is a 78 year old African-American male with past medical history significant forESRDonMWFHD,chronic hypotension on Midodrin, A. fib on Coumadin;chronic zoster on Valtrex; rheumatoid arthritis on Plaquenil, chronic combined CHF. Patient was admitted to this hospitalin May for falls/possible seizures and started on Keppra by neurology,discharged to acute rehaband thenhome on 6/17 with home health. Presented withgeneralized weaknessandrecurrent falls x3.Patient presented to the ED after a fall ( slid down to the floor as he was feeling weak and could not unlock the walker while transitioning off commode). Patient wason the floor for ~5 hours until acaregiver found him. Patient lives alone,has a nurse comein in the mornings,and son/grandchildren check on him in the evening. During his prior admission, he fell on his hip and was complaining about pain. He was discharged to [facility] rehab and a scar developed on the back of his leg. When he presented to [facility] where he was noted to have a significant area of necrosis on his left posterior calf with foul-smelling discharge and significant edema of most of the LLE. Patient transferred here and admitted to Hospitalist's service with empiric antibiotics (Vanco/Zosyn) and orthopedics consulted. MRI obtained on 7/27 showed extensive subcutaneous edema /mild intramuscular edema of the LLE concerning for cellulitis/myositis. There was no evidence of necrotizing fasciitis or abscess or osteomyelitis.  Patient has also been seen by nephrology and receiving HD TTSsince admission.Hospital course has been complicated by prolonged bleeding from AV fistula postdialysis.Fistulogram is planned, therefore, Coumadin is on hold.  02/19/2019: Patient seen.  Complaining of some shortness of breath but on  oxygen by nasal cannula.  Orthopedic input is appreciated. Patient will follow with Dr. Sharol Given on discharge. Social work note is appreciated, apparently, patient's daughter wants patient to be placed in skilled nursing facility. Likely discharge when a bed is available, and vascular access issues are settled. -Per radiology unable to proceed with fistulogram and intervention today.  Rescheduled for tomorrow.   Assessment & Plan:   Principal Problem:   Cellulitis Active Problems:   Essential hypertension   ATRIAL FIBRILLATION   DM (diabetes mellitus), type 2 with renal complications (HCC)   Chronic combined systolic and diastolic heart failure (HCC)   ESRD on dialysis ([place])   Seizure ([place])   Open wound of left lower extremity   Varicose veins of left lower extremity with inflammation, with ulcer of calf limited to breakdown of skin (HCC)   Moderate protein-calorie malnutrition ([place])   1. Left lower extremity wound necrosis with cellulitis/myositis:  Admitted, and empirically started onIV Zosyn/vancomycin.  Leukocytosis has gone from 16.7to13.4.  No drainage to culture. Patient is still on IV vancomycin (Zosyn has been discontinued)  Orthopedic input is appreciated. ABIis indicative ofwhich indicate peripheral vascular disease left>than right but with biphasic flow. Patient now has venous compression wrap of left lower extremity asper Dr. Sharol Given to help reduce venous swelling.  No need for deeper debridement per Dr Sharol Given as improved now and nothing to culture. Plaquenil is on hold due to concerns for acute infection. 02/18/2019: Continue IV vancomycin.  Continue to monitor WBC count. 02/19/2019: He already received 7 days of IV vancomycin.  Will discontinue.  WBC count noted to be elevated but afebrile.  Continue to monitor.  If his WBC count is continuing to worsen then we may need to reevaluate the wound or look for other sources.  2. ESRD/bleeding at AV fistula site:  On  hemodialysis TTS to accommodate patient census (usually on MWF schedule as outpatient).  Prolonged bleeding after hemodialysis as outpatient and while here. Eventual plan is for fistulogram.  02/18/2019: Nephrology is managing. Cardiology input is appreciated.  IR consulted for fistulogram and possible intervention. 02/19/2019: Nephrology following.  He was unable to get fistulogram today.  Rescheduled for tomorrow.  Monitor hemoglobin.  3. Chronic hypotension: Likely multifactorial.  Patient has EF of 35 to 40%, with grade ventricular overload/failure  Hyportension can worsen bleeding from AV fistula    4. Chronic atrial fibrillation: Holding Coumadin and concern for problem #2. Holding heparin as well due to bleeding. Rate controlled, cannot tolerate AV blockers due to problem #3  5. Recurrent falls,? Seizures: He was admitted 5/24, MRI brain no acute changes, EEG showed mild diffuse slowing, no epileptiform discharges. He was diagnosed with uremic encephalopathy and body jerking resolved with discontinuation of Gabapentin. He reports that he was taking more than prescribed due to postherpetic neuralgia. He was seen by Neurology for follow up in June who felt that his confusion and myoclonic jerks were likely due to gabapentin toxicity in the setting of uremic encephalopathy. Repeat EEG was normal this month and neurology tapered Keppra to off (Per neuro note 7/17 reduce Keppra to 1/2 daily for one week then d/c. No additional dose is needed after dialysis).D/Ced Keppra.PT evaluation, likely will need placement. 02/19/2019: Falls are likely multifactorial, including but not limited to hemodynamic instability. -Continue fall precautions.  6. Acute on chronic combined CHF:  Asymmetrical leg edema likely related to venous insufficiency and CHF. Not on any other cardiac meds due to hypotension.Patient complained of dyspnea during the hospital course (7/29) and chest x-ray was  suggestive of pulmonary edema.Repeat echo shows biventricular systolic dysfunction with EF 35-40%, severely dilated RV/ right atrial enlargement which are not very far from baseline. Appreciate cardiology recommendations. S/p for dialysis 7/31 as going back to MWF schedule. Off 02 and saturating well on RA. 02/17/2019: Cardiology input is appreciated. Cardiology is managing. Symptoms are improving. 02/18/2019: Patient receiving dialysis today.  Continue current medications.  Stable at this time. 02/19/2019: He is complaining of shortness of breath.  On oxygen by nasal cannula.  Requested nursing staff to monitor pulse oximetry.  7. Chronic anemiaof chronic kidney disease:  Monitor for acute decompensation in the setting of problem #2. On Aranesp q. weekly. Also on vitamin B12 supplements. 02/19/2019: Hemoglobin 9.9.   8. Rheumatoid arthritis:  Plaquenil is on hold.  9. Peripheral vascular disease:  Was on Brilinta/statins. Cardiology changed to aspirin. No h/o cardiac or peripheral vascular stents.  10. Hyperlipidemia:  On Lipitor.  11. Diabetes mellitus type 2:  Sliding scale insulin for now. On Victoza/Levemir as outpatient. 02/19/2019: Blood glucose stable at this time.  Continue Accu-Cheks, insulin sliding scale.  12 . Anxiety/depression: On Prozac.   DVT prophylaxis:Heparin stopped due to AV fistula bleeding? Code Status:DNR Family / Patient Communication:Attempted to call daughter [name] at [phone]. Was unable to reach. Disposition Plan:Skilled nursing facility. Patient's daughter wants patient placed in [place].Social work team is arranging.    Consultants:  Cardiology, orthopedics, nephrology, interventional radiology    Antimicrobials:   IV Zosyn (stopped 02/16/2019), vancomycin    Subjective: He is complaining of some shortness of breath  Objective: Vitals:   02/18/19 1756 02/18/19 2108 02/19/19 0449 02/19/19 1459  BP: 93/63  92/68 129/71 (!) 141/97  Pulse: 60 (!) 59 69 69  Resp: (!) 25 20 20 19   Temp: 98.5  F (36.9 C) 98.2 F (36.8 C) 98 F (36.7 C) 98.9 F (37.2 C)  TempSrc: Oral Oral Oral Oral  SpO2: 100% 95% 92% 98%  Weight:      Height:        Intake/Output Summary (Last 24 hours) at 02/19/2019 1845 Last data filed at 02/19/2019 1645 Gross per 24 hour  Intake 160 ml  Output --  Net 160 ml   Filed Weights   02/17/19 1355 02/18/19 1225 02/18/19 1631  Weight: 117 kg 115 kg 113 kg    Examination:  General exam: Chronically ill-appearing obese male.  On oxygen by nasal cannula. Respiratory system: Decreased breath sounds lower lobes, crackles.  Cardiovascular system: S1 & S2 heard Gastrointestinal system: Obese, nontender. Normal bowel sounds heard. Central nervous system: Oriented x2, able to move all extremities but appears debilitated Extremities: Left lower extremity with wrapping, no right lower extremity edema   Data Reviewed: I have personally reviewed following labs and imaging studies  CBC: Recent Labs  Lab 02/15/19 0500 02/16/19 0258 02/17/19 0239 02/18/19 0346 02/19/19 0220  WBC 14.8* 13.4* 13.9* 14.5* 15.1*  NEUTROABS  --   --   --  12.1*  --   HGB 9.2* 9.4* 9.5* 9.9* 9.9*  HCT 29.4* 29.5* 30.2* 31.1* 31.5*  MCV 98.0 96.7 96.2 96.0 97.2  PLT 254 275 296 315 854   Basic Metabolic Panel: Recent Labs  Lab 02/13/19 0233 02/14/19 0130 02/15/19 0854 02/16/19 0930 02/17/19 0239 02/18/19 0346 02/19/19 0220  NA 134* 135 135 132* 134* 135 137  K 3.7 3.6 3.7 4.1 4.1 4.2 4.2  CL 94* 94* 94* 95* 94* 95* 97*  CO2 22 26 25 24 23 23 25   GLUCOSE 118* 123* 103* 135* 105* 121* 113*  BUN 47* 25* 16 27* 37* 34* 21  CREATININE 7.94* 5.94* 4.04* 5.34* 6.54* 6.31* 4.52*  CALCIUM 9.0 9.4 9.3 9.6 9.8 9.7 9.4  MG  --   --   --   --   --  2.2  --   PHOS 3.0 2.8 2.5 3.8  --  4.2  --    GFR: Estimated Creatinine Clearance: 19.2 mL/min (A) (by C-G formula based on SCr of 4.52 mg/dL  (H)). Liver Function Tests: Recent Labs  Lab 02/13/19 0233 02/14/19 0130 02/15/19 0854 02/16/19 0930 02/18/19 0346  ALBUMIN 2.7* 2.9* 3.3* 3.1* 3.1*   No results for input(s): LIPASE, AMYLASE in the last 168 hours. No results for input(s): AMMONIA in the last 168 hours. Coagulation Profile: Recent Labs  Lab 02/14/19 0130 02/15/19 1220 02/16/19 0258 02/17/19 0239 02/18/19 0346  INR 2.0* 2.1* 2.1* 1.7* 1.6*   Cardiac Enzymes: No results for input(s): CKTOTAL, CKMB, CKMBINDEX, TROPONINI in the last 168 hours. BNP (last 3 results) No results for input(s): PROBNP in the last 8760 hours. HbA1C: No results for input(s): HGBA1C in the last 72 hours. CBG: Recent Labs  Lab 02/18/19 1748 02/18/19 2111 02/19/19 0748 02/19/19 1227 02/19/19 1641  GLUCAP 91 102* 103* 123* 114*   Lipid Profile: No results for input(s): CHOL, HDL, LDLCALC, TRIG, CHOLHDL, LDLDIRECT in the last 72 hours. Thyroid Function Tests: No results for input(s): TSH, T4TOTAL, FREET4, T3FREE, THYROIDAB in the last 72 hours. Anemia Panel: No results for input(s): VITAMINB12, FOLATE, FERRITIN, TIBC, IRON, RETICCTPCT in the last 72 hours. Sepsis Labs: No results for input(s): PROCALCITON, LATICACIDVEN in the last 168 hours.  Recent Results (from the past 240 hour(s))  Blood Culture (routine x 2)  Status: None   Collection Time: 01/23/2019  9:30 AM   Specimen: BLOOD RIGHT ARM  Result Value Ref Range Status   Specimen Description   Final    BLOOD RIGHT ARM Performed at [facility], [address] [place], [place] [address]    Special Requests   Final    BOTTLES DRAWN AEROBIC AND ANAEROBIC Blood Culture adequate volume Performed at [facility], [address] [place], [place] [address]    Culture   Final    NO GROWTH 5 DAYS Performed at [facility], [address], [place], [place] [address]    Report Status 02/15/2019 FINAL  Final  Blood Culture (routine x 2)      Status: None   Collection Time: 01/27/2019  9:50 AM   Specimen: BLOOD RIGHT HAND  Result Value Ref Range Status   Specimen Description   Final    BLOOD RIGHT HAND Performed at [facility], [address] [place], [place] [address]    Special Requests   Final    BOTTLES DRAWN AEROBIC AND ANAEROBIC Blood Culture results may not be optimal due to an inadequate volume of blood received in culture bottles Performed at [facility], [address] [place], [place] [address]    Culture   Final    NO GROWTH 5 DAYS Performed at [facility], [place] [address], [place], [place] [address]    Report Status 02/15/2019 FINAL  Final  SARS Coronavirus 2 (Performed in [place] hospital lab)     Status: None   Collection Time: 01/29/2019 12:29 PM   Specimen: Nasopharyngeal Swab  Result Value Ref Range Status   SARS Coronavirus 2 NEGATIVE NEGATIVE Final    Comment: (NOTE) If result is NEGATIVE SARS-CoV-2 target nucleic acids are NOT DETECTED. The SARS-CoV-2 RNA is generally detectable in upper and lower  respiratory specimens during the acute phase of infection. The lowest  concentration of SARS-CoV-2 viral copies this assay can detect is 250  copies / mL. A negative result does not preclude SARS-CoV-2 infection  and should not be used as the sole basis for treatment or other  patient management decisions.  A negative result may occur with  improper specimen collection / handling, submission of specimen other  than nasopharyngeal swab, presence of viral mutation(s) within the  areas targeted by this assay, and inadequate number of viral copies  (<250 copies / mL). A negative result must be combined with clinical  observations, patient history, and epidemiological information. If result is POSITIVE SARS-CoV-2 target nucleic acids are DETECTED. The SARS-CoV-2 RNA is generally detectable in upper and lower  respiratory specimens dur ing the acute phase of infection.   Positive  results are indicative of active infection with SARS-CoV-2.  Clinical  correlation with patient history and other diagnostic information is  necessary to determine patient infection status.  Positive results do  not rule out bacterial infection or co-infection with other viruses. If result is PRESUMPTIVE POSTIVE SARS-CoV-2 nucleic acids MAY BE PRESENT.   A presumptive positive result was obtained on the submitted specimen  and confirmed on repeat testing.  While 2019 novel coronavirus  (SARS-CoV-2) nucleic acids may be present in the submitted sample  additional confirmatory testing may be necessary for epidemiological  and / or clinical management purposes  to differentiate between  SARS-CoV-2 and other Sarbecovirus currently known to infect humans.  If clinically indicated additional testing  with an alternate test  methodology [phone]) is advised. The SARS-CoV-2 RNA is generally  detectable in upper and lower respiratory sp ecimens during the acute  phase of infection. The expected result is Negative. Fact Sheet for Patients:  StrictlyIdeas.no Fact Sheet for Healthcare Providers: BankingDealers.co.za This test is not yet approved or cleared by the [place] FDA and has been authorized for detection and/or diagnosis of SARS-CoV-2 by FDA under an Emergency Use Authorization (EUA).  This EUA will remain in effect (meaning this test can be used) for the duration of the COVID-19 declaration under Section 564(b)(1) of the Act, 21 U.S.C. section 360bbb-3(b)(1), unless the authorization is terminated or revoked sooner. Performed at [facility], [address] [place], [place] [address]   MRSA PCR Screening     Status: Abnormal   Collection Time: 02/15/19  6:18 PM   Specimen: Nasal Mucosa; Nasopharyngeal  Result Value Ref Range Status   MRSA by PCR POSITIVE (A) NEGATIVE Final    Comment:        The GeneXpert MRSA  Assay (FDA approved for NASAL specimens only), is one component of a comprehensive MRSA colonization surveillance program. It is not intended to diagnose MRSA infection nor to guide or monitor treatment for MRSA infections. RESULT CALLED TO, READ BACK BY AND VERIFIED WITH: Delfino Lovett RN 20:40 02/15/19 ([email or web address]) Performed at [facility], [place] [address] [place], [place] [address]          Radiology Studies: No results found.      Scheduled Meds:  aspirin EC  81 mg Oral Daily   atorvastatin  10 mg Oral Daily   Chlorhexidine Gluconate Cloth  6 each Topical Q0600   [START ON 03/03/19] darbepoetin (ARANESP) injection - DIALYSIS  100 mcg Intravenous Q Wed-HD   feeding supplement (PRO-STAT SUGAR FREE 64)  30 mL Oral BID   FLUoxetine  20 mg Oral Daily   insulin aspart  0-15 Units Subcutaneous TID WC   midodrine  10 mg Oral Q M,W,F-HD   midodrine  5 mg Oral BID WC   multivitamin  1 tablet Oral QHS   mupirocin ointment  1 application Nasal BID   pantoprazole  40 mg Oral Daily   vitamin B-12  1,000 mcg Oral Daily   Continuous Infusions:  albumin human     [START ON Mar 03, 2019] vancomycin       LOS: 9 days    Time spent: 35 min    Ridwan Bondy, MD [facility] Pager on [place]  If 7PM-7AM, please contact night-coverage [email or web address] Password [facility] 02/19/2019, 6:45 PM

## 2019-02-19 NOTE — TOC Progression Note (Addendum)
Transition of Care [facility]) - Progression Note    Patient Details  Name: Bruce Cole MRN: [id number] Date of Birth: 10-23-1940  Transition of Care [facility]) CM/SW [place], [place] Phone Number: 02/19/2019, 8:43 AM  Clinical Narrative:    2:34pm- Spoke with Bruce Cole in admissions; due to [facility] they have lost power and referral has not come through; [place] took my number and will reach out when power restored. Discussed case with assistant director Bruce Cole.   11:45am- Spoke with Bruce Cole in admissions at [facility] and [place]; we discussed referral. She states it is difficult to get a spot in dialysis centers on [place]. We discussed that first we would like to see if SNF is able to accept the pt; referral will be faxed to [phone] and if SNF can accept then we will obtain a list of dialysis centers that they transport to in hopes of finding a spot for pt.   11:33am- Aware pt refused to work with PT today; if continues to refuse may be beneficial to have goals of care meeting/call with family to assess further care plans as pt requires max assist.  8:43am- CSW spoke with pt daughter Bruce Cole on the phone, confirmed with pt daughter continued desire to move pt to [place]. Message left for [facility] admissions director at [phone]. Daughter asking for MD call; she expressed that she "hasnt heard from a doctor since he came into the ED." MD was paged through [place] secure chat with this request for communication.   Expected Discharge Plan: Skilled Nursing Facility Barriers to Discharge: Ship broker, Continued Medical Work up, SNF Pending bed offer  Expected Discharge Plan and Services Expected Discharge Plan: [place] In-house Referral: Clinical Social Work Discharge Planning Services: CM Consult Post Acute Care Choice: [place] arrangements for the past 2 months: [place]  Determinants of Health (SDOH) Interventions    Readmission Risk Interventions Readmission Risk Prevention Plan 02/13/2019  Transportation Screening Complete  Medication Review Press photographer) Complete  PCP or Specialist appointment within 3-5 days of discharge Not Complete  PCP/Specialist Appt Not Complete comments likely pt will dc to SNF  [place] or Home Care Consult Complete  SW Recovery Care/Counseling Consult Complete  Palliative Care Screening Not Applicable  Skilled Nursing Facility Complete  Some recent data might be hidden

## 2019-02-19 NOTE — Progress Notes (Addendum)
[facility] Progress Note   Subjective:   Seen in room. Feeling better, denies any SOB this AM. Denies cough, CP, palpitations, dizziness, edema, abdominal pain, N/V/D. Sats 92-100%. Denies any leg pain this AM.  Planned for fistulogram today.   Objective Vitals:   02/18/19 1631 02/18/19 1756 02/18/19 2108 02/19/19 0449  BP: 129/89 93/63 92/68  129/71  Pulse: (!) 54 60 (!) 59 69  Resp: 18 (!) 25 20 20   Temp: 98 F (36.7 C) 98.5 F (36.9 C) 98.2 F (36.8 C) 98 F (36.7 C)  TempSrc: Oral Oral Oral Oral  SpO2: 98% 100% 95% 92%  Weight: 113 kg     Height:       Physical Exam General: Chronically ill appearing male, alert and in NAD Heart: RRR, no murmurs,rubs or gallops Lungs: Diminished breath sounds bilateral bases. No wheezing, rhonchi or rales appreciated Abdomen: Soft, non-tender, non-distended. + BS Extremities: No edema RLL, left leg wrapped Dialysis Access:  LUE AVF + thrill  Additional Objective Labs: Basic Metabolic Panel: Recent Labs  Lab 02/15/19 0854 02/16/19 0930 02/17/19 0239 02/18/19 0346 02/19/19 0220  NA 135 132* 134* 135 137  K 3.7 4.1 4.1 4.2 4.2  CL 94* 95* 94* 95* 97*  CO2 25 24 23 23 25   GLUCOSE 103* 135* 105* 121* 113*  BUN 16 27* 37* 34* 21  CREATININE 4.04* 5.34* 6.54* 6.31* 4.52*  CALCIUM 9.3 9.6 9.8 9.7 9.4  PHOS 2.5 3.8  --  4.2  --    Liver Function Tests: Recent Labs  Lab 02/15/19 0854 02/16/19 0930 02/18/19 0346  ALBUMIN 3.3* 3.1* 3.1*   CBC: Recent Labs  Lab 02/15/19 0500 02/16/19 0258 02/17/19 0239 02/18/19 0346 02/19/19 0220  WBC 14.8* 13.4* 13.9* 14.5* 15.1*  NEUTROABS  --   --   --  12.1*  --   HGB 9.2* 9.4* 9.5* 9.9* 9.9*  HCT 29.4* 29.5* 30.2* 31.1* 31.5*  MCV 98.0 96.7 96.2 96.0 97.2  PLT 254 275 296 315 326   Blood Culture    Component Value Date/Time   SDES  02/12/2019 0950    BLOOD RIGHT HAND Performed at [facility], [address], [place], [place] [address]    [facility]  01/29/2019 0950    BOTTLES DRAWN AEROBIC AND ANAEROBIC Blood Culture results may not be optimal due to an inadequate volume of blood received in culture bottles Performed at [facility], [address] [place], [place] [address]    CULT  02/14/2019 0950    NO GROWTH 5 DAYS Performed at [facility], [place] [address] [place], [place] [address]    REPTSTATUS 02/15/2019 FINAL 01/28/2019 0950   CBG: Recent Labs  Lab 02/18/19 0813 02/18/19 1208 02/18/19 1748 02/18/19 2111 02/19/19 0748  GLUCAP 147* 87 91 102* 103*   Medications: . albumin human    . [START ON 2019/03/09] vancomycin     . aspirin EC  81 mg Oral Daily  . atorvastatin  10 mg Oral Daily  . Chlorhexidine Gluconate Cloth  6 each Topical Q0600  . Chlorhexidine Gluconate Cloth  6 each Topical Q0600  . [START ON 03-09-2019] darbepoetin (ARANESP) injection - DIALYSIS  100 mcg Intravenous Q Wed-HD  . feeding supplement (PRO-STAT SUGAR FREE 64)  30 mL Oral BID  . FLUoxetine  20 mg Oral Daily  . insulin aspart  0-15 Units Subcutaneous TID WC  . midodrine  10 mg Oral Q M,W,F-HD  .  midodrine  5 mg Oral BID WC  . multivitamin  1 tablet Oral QHS  . mupirocin ointment  1 application Nasal BID  . pantoprazole  40 mg Oral Daily  . vitamin B-12  1,000 mcg Oral Daily    Dialysis Orders: Center:[facility] MWF. 2K/ 2.25 Ca; EDW 123.5kg (last post weight +3.4kg); LUE AVF425/800 Heparin 2000 unit bolus Mircera 156mcg IV q 2 weeks  Renvela 800mg  3 tabs PO TID  Assessment/Plan: 1. Shortness of breath:PreviousCXR pulm edema, echo also showed significant R heart failure and EF 35-40%. SP multiple extra dialysis treatments this admission for volume, now 10.5kg below outpatient EDW. Reports shortness of breath has now resolved. UF with HD tomorrow as tolerated.  2. Cellulitis:Left lower extremity, seen by ortho.MRI completed without osteomyelitis, plan is for compression per ortho. Per  primary/ortho.  3. ESRD:Normally dialyzes MWF at [facility], multiple extra treatments including yesterday. HD tomorrow per regular schedule. K+ 4.2.  4. Bleeding from LPN:PYYFRTMYT bleeding after dialysison 7/28, IV heparin likely contributing. IR has been consulted for fistulogram but could not complete previously due to elevated INR. Off of heparin and brilinta. Fistulogram possibly planned for today per notes 5. Hypotension:Blood pressure chronically low, has received albumin for blood pressure support during dialysis. On midodrine.BP stable. Orthostatics ordered with HD today.  6. Anemia:Hemoglobin 9.9.Continuearanesp 41mcg IV q week with dialysis. 7. Metabolic bone disease:Calcium9.7- not on VDRA, use low calcium bath. Last outpatient PTH 241.Phos binders on hold due to low phosphorus, now improved to 4.2.  8. Nutrition:Albumin 3.1. Continue supplements.  9. A. Fib on coumadin: INR subtherapeuticon admission, initially on heparin drip. Per pharmacy/primary. 10. T2DM: Currently in SSI, per primary.He is interested in resuming outpatient meds on discharge if possible.  11.Seizure disorder:On Keppra 12. Dispo: SNF, per notes daughter is interested in finding and SNF in [place].    Anice Paganini, PA-C 02/19/2019, 9:56 AM  [facility] Pager: [phone]  Pt seen, examined and agree w A/P as above.  Kelly Splinter  MD 02/19/2019, 12:50 PM

## 2019-02-19 NOTE — Progress Notes (Signed)
Pharmacy Antibiotic Note  Bruce Cole is a 78 y.o. male admitted on 02/06/2019 with LLE cellulitis and necrosis.  Pharmacy has been consulted for Vancomycin dosing.   A post-HD on 7/31 was elevated at 30 mcg/ml. The patient received a short HD session on 8/2 with a full maintenance dose given. A reduced Vanc dose of 500 mg IV was given on 8/3; Random Vanc level 26 mcg/ml today.  Expect level to drop to ~23 by 8/5, on target for pre-HD level.  HD planned on 8/5, on schedule  Plan:  Continue Vancomycin 1gm IV MWF after HD.  Will follow up for length of therapy, any changes to HD plans.   Height: 6\' 3"  (190.5 cm) Weight: 249 lb 1.9 oz (113 kg) IBW/kg (Calculated) : 84.5  Temp (24hrs), Avg:98.3 F (36.8 C), Min:98 F (36.7 C), Max:98.9 F (37.2 C)  Recent Labs  Lab 02/15/19 0500 02/15/19 0854 02/15/19 1220 02/16/19 0258 02/16/19 0930 02/17/19 0239 02/18/19 0346 02/19/19 0220  WBC 14.8*  --   --  13.4*  --  13.9* 14.5* 15.1*  CREATININE  --  4.04*  --   --  5.34* 6.54* 6.31* 4.52*  VANCORANDOM  --   --  30  --   --   --   --  26     Allergies  Allergen Reactions  . Neurontin [Gabapentin] Other (See Comments)    Encephalopathy with myoclonic jerking  . Ace Inhibitors Other (See Comments)    Worsening renal insufficiency    Thank you for allowing pharmacy to be a part of this patient's care.  Arty Baumgartner, [place] Pager: [phone] or phone: [phone] 02/19/2019 4:05 PM

## 2019-02-19 NOTE — Progress Notes (Signed)
Unable to proceed with fistulagram with possible intervention today in IR due to emergent procedures.   Patient has been rescheduled for tomorrow (8/5) - IR will call for patient when ready. I have placed orders for patient to be NPO after midnight in case moderate sedation is required for intervention.  Please call IR with questions or concerns.  Candiss Norse, PA-C

## 2019-02-20 ENCOUNTER — Encounter (HOSPITAL_COMMUNITY): Payer: Self-pay | Admitting: Interventional Radiology

## 2019-02-20 ENCOUNTER — Inpatient Hospital Stay (HOSPITAL_COMMUNITY): Payer: Medicare Other

## 2019-02-20 HISTORY — PX: IR DIALY SHUNT INTRO NEEDLE/INTRACATH INITIAL W/IMG LEFT: IMG6102

## 2019-02-20 LAB — BASIC METABOLIC PANEL
Anion gap: 17 — ABNORMAL HIGH (ref 5–15)
BUN: 42 mg/dL — ABNORMAL HIGH (ref 8–23)
CO2: 26 mmol/L (ref 22–32)
Calcium: 9.4 mg/dL (ref 8.9–10.3)
Chloride: 94 mmol/L — ABNORMAL LOW (ref 98–111)
Creatinine, Ser: 6.56 mg/dL — ABNORMAL HIGH (ref 0.61–1.24)
GFR calc Af Amer: 9 mL/min — ABNORMAL LOW (ref >60)
GFR calc non Af Amer: 8 mL/min — ABNORMAL LOW (ref >60)
Glucose, Bld: 131 mg/dL — ABNORMAL HIGH (ref 70–99)
Potassium: 4.4 mmol/L (ref 3.5–5.1)
Sodium: 137 mmol/L (ref 135–145)

## 2019-02-20 LAB — CBC
HCT: 30.1 % — ABNORMAL LOW (ref 39.0–52.0)
Hemoglobin: 9.5 g/dL — ABNORMAL LOW (ref 13.0–17.0)
MCH: 30.8 pg (ref 26.0–34.0)
MCHC: 31.6 g/dL (ref 30.0–36.0)
MCV: 97.7 fL (ref 80.0–100.0)
Platelets: 326 10*3/uL (ref 150–400)
RBC: 3.08 MIL/uL — ABNORMAL LOW (ref 4.22–5.81)
RDW: 20.3 % — ABNORMAL HIGH (ref 11.5–15.5)
WBC: 15 10*3/uL — ABNORMAL HIGH (ref 4.0–10.5)
nRBC: 0.5 % — ABNORMAL HIGH (ref 0.0–0.2)

## 2019-02-20 LAB — GLUCOSE, CAPILLARY
Glucose-Capillary: 110 mg/dL — ABNORMAL HIGH (ref 70–99)
Glucose-Capillary: 120 mg/dL — ABNORMAL HIGH (ref 70–99)
Glucose-Capillary: 67 mg/dL — ABNORMAL LOW (ref 70–99)

## 2019-02-20 MED ORDER — PIPERACILLIN-TAZOBACTAM 3.375 G IVPB: 3.3750 g | Freq: Two times a day (BID) | INTRAVENOUS | Status: DC

## 2019-02-20 MED ORDER — MORPHINE SULFATE (PF) 2 MG/ML IV SOLN
INTRAVENOUS | Status: AC
  Administered 2019-02-20: 2 mg via INTRAVENOUS
  Filled 2019-02-20: qty 1

## 2019-02-20 MED ORDER — MIDODRINE HCL 5 MG PO TABS
ORAL_TABLET | ORAL | Status: AC
  Administered 2019-02-20: 10 mg via ORAL
  Filled 2019-02-20: qty 2

## 2019-02-20 MED ORDER — MORPHINE SULFATE (PF) 4 MG/ML IV SOLN
INTRAVENOUS | Status: AC
  Filled 2019-02-20: qty 1

## 2019-02-20 MED ORDER — MORPHINE SULFATE (PF) 4 MG/ML IV SOLN
4.0000 mg | INTRAVENOUS | Status: DC | PRN
  Administered 2019-02-20: 4 mg via INTRAVENOUS

## 2019-02-20 MED ORDER — PIPERACILLIN-TAZOBACTAM 3.375 G IVPB 30 MIN
3.3750 g | Freq: Once | INTRAVENOUS | Status: DC
  Filled 2019-02-20: qty 50

## 2019-02-20 MED ORDER — MORPHINE SULFATE (PF) 2 MG/ML IV SOLN: 1.0000 mg | Freq: Once | INTRAVENOUS | Status: DC

## 2019-02-20 MED ORDER — IOHEXOL 300 MG/ML  SOLN
100.0000 mL | Freq: Once | INTRAMUSCULAR | Status: AC | PRN
  Administered 2019-02-20: 24 mL via INTRAVENOUS

## 2019-02-20 MED ORDER — NEPRO/CARBSTEADY PO LIQD: 237.0000 mL | Freq: Three times a day (TID) | ORAL | Status: DC

## 2019-02-20 MED ORDER — MORPHINE SULFATE (PF) 2 MG/ML IV SOLN
2.0000 mg | Freq: Once | INTRAVENOUS | Status: AC
  Administered 2019-02-20: 2 mg via INTRAVENOUS

## 2019-02-20 MED ORDER — DARBEPOETIN ALFA 100 MCG/0.5ML IJ SOSY
PREFILLED_SYRINGE | INTRAMUSCULAR | Status: AC
  Administered 2019-02-20: 100 ug via INTRAVENOUS
  Filled 2019-02-20: qty 0.5

## 2019-02-20 MED ORDER — MAGNESIUM SULFATE 50 % IJ SOLN
2.0000 g | Freq: Once | INTRAMUSCULAR | Status: AC
  Administered 2019-02-20: 2 g via INTRAVENOUS
  Filled 2019-02-20: qty 4

## 2019-02-20 MED ORDER — PRO-STAT SUGAR FREE PO LIQD: 30.0000 mL | Freq: Three times a day (TID) | ORAL | Status: DC

## 2019-02-20 MED ORDER — MORPHINE SULFATE (PF) 2 MG/ML IV SOLN: 1.0000 mg | INTRAVENOUS | Status: DC | PRN

## 2019-02-20 MED ORDER — DEXTROSE 50 % IV SOLN
25.0000 mL | Freq: Once | INTRAVENOUS | Status: AC
  Administered 2019-02-20: 25 mL via INTRAVENOUS

## 2019-02-21 ENCOUNTER — Telehealth: Payer: Self-pay | Admitting: Family

## 2019-03-19 NOTE — Progress Notes (Signed)
Occupational Therapy Treatment Patient Details Name: Bruce Cole MRN: [id number] DOB: Dec 06, 1940 Today's Date: 2019/03/18    History of present illness Pt is a 78 y/o male admitted 01/17/2019 secondary to fall and seizure. Also found to have LLE wound and cellulitis. Pt has been receiving HD TTS since admission; hospital course complicated by prolonged bleeding from AV fistula postdialysis. S/p LUE fistulagram 8/5. PMH includes HTN, a fib, DM, ESRD on HD, gout, CHF.      Follow Up Recommendations  SNF;Supervision/Assistance - 24 hour    Equipment Recommendations  None recommended by OT    Recommendations for Other Services      Precautions / Restrictions Precautions Precautions: Fall Precaution Comments: Posterior BLE wounds Restrictions Weight Bearing Restrictions: No       Mobility Bed Mobility Overal bed mobility: Needs Assistance Bed Mobility: Rolling Rolling: Mod assist         General bed mobility comments: Able to roll R/L with modA when cued to assist with UE support on bed rails  Transfers Overall transfer level: Needs assistance               General transfer comment: Pt in [facility] room. Tolerated transfer from bed to recliner with lift well; assist +2 for lift and BLE assist    Balance Overall balance assessment: Needs assistance Sitting-balance support: Bilateral upper extremity supported;Feet supported Sitting balance-Leahy Scale: Poor Sitting balance - Comments: Reliant on UE support and assist to translate weight anteriorly and maintain upright posture in recliner                                   ADL either performed or assessed with clinical judgement   ADL Overall ADL's : Needs assistance/impaired     Grooming: Sitting;Wash/dry face Grooming Details (indicate cue type and reason): sitting in chair                               General ADL Comments: pt is dependent in bathing and dressing at baseline, not  tested     Vision Baseline Vision/History: Wears glasses Wears Glasses: At all times     Perception     Praxis      Cognition Arousal/Alertness: Awake/alert Behavior During Therapy: [facility] for tasks assessed/performed;Flat affect Overall Cognitive Status: No family/caregiver present to determine baseline cognitive functioning                                 General Comments: Slowed processing, following some commands with increased time; but answering questions and engaging in conversation appropriately majority of session. Focused on wanting a something to drink (currently NPO) despite therapist's attempt to explain/education        Exercises     Shoulder Instructions       General Comments O2 [place] removed for transfer, SpO2 maintaining 92% on RA; HR 68    Pertinent Vitals/ Pain       Pain Assessment: 0-10 Faces Pain Scale: Hurts little more Pain Location: LLE  Pain Descriptors / Indicators: Grimacing;Guarding Pain Intervention(s): Limited activity within patient's tolerance;Monitored during session     Prior Functioning/Environment              Frequency  Min 2X/week        Progress Toward Goals  OT Goals(current goals  can now be found in the care plan section)  Progress towards OT goals: Progressing toward goals  Acute Rehab OT Goals Patient Stated Goal: to go to rehab to get stronger and then to ALF  Plan      Co-evaluation      Reason for Co-Treatment: Complexity of the patient's impairments (multi-system involvement);To address functional/ADL transfers PT goals addressed during session: Mobility/safety with mobility        AM-PAC OT "6 Clicks" Daily Activity     Outcome Measure   Help from another person eating meals?: A Little Help from another person taking care of personal grooming?: A Lot Help from another person toileting, which includes using toliet, bedpan, or urinal?: Total Help from another person bathing (including  washing, rinsing, drying)?: A Lot Help from another person to put on and taking off regular upper body clothing?: A Lot Help from another person to put on and taking off regular lower body clothing?: Total 6 Click Score: 11    End of Session Equipment Utilized During Treatment: Other (comment)(maxi sky)  OT Visit Diagnosis: Pain;Muscle weakness (generalized) (M62.81);Unsteadiness on feet (R26.81)   Activity Tolerance Patient tolerated treatment well   Patient Left in bed;with call bell/phone within reach   Nurse Communication          Time: 6301-6010 OT Time Calculation (min): 24 min  Charges: OT General Charges $OT Visit: 1 Visit OT Treatments $Self Care/Home Management : 8-22 mins  Kari Baars, [place] [phone] Office- [phone]      Bruce Cole, Bruce Cole 02-25-19, 2:23 PM

## 2019-03-19 NOTE — TOC Progression Note (Addendum)
Transition of Care [facility]) - Progression Note    Patient Details  Name: Bruce Cole MRN: [id number] Date of Birth: 1940/10/14  Transition of Care [facility]) CM/SW [place], [place] Phone Number: Feb 22, 2019, 8:24 AM  Clinical Narrative:    11:53am- CSW still unable to reach admissions director at [facility].   10:19am- CSW left another message with Gregary Signs in admissions at [facility].  8:24am- CSW left another message with Gregary Signs in admissions at [facility].   Expected Discharge Plan: Skilled Nursing Facility Barriers to Discharge: Ship broker, Continued Medical Work up, SNF Pending bed offer  Expected Discharge Plan and Services Expected Discharge Plan: [place] In-house Referral: Clinical Social Work Discharge Planning Services: CM Consult Post Acute Care Choice: [place] arrangements for the past 2 months: [place] Determinants of Health (SDOH) Interventions    Readmission Risk Interventions Readmission Risk Prevention Plan 02/13/2019  Transportation Screening Complete  Medication Review Press photographer) Complete  PCP or Specialist appointment within 3-5 days of discharge Not Complete  PCP/Specialist Appt Not Complete comments likely pt will dc to SNF  [place] or Home Care Consult Complete  SW Recovery Care/Counseling Consult Complete  Palliative Care Screening Not Applicable  Skilled Nursing Facility Complete  Some recent data might be hidden

## 2019-03-19 NOTE — Progress Notes (Signed)
Called back to bedside 1739 due to change in patient status.  After dialysis, patient arrived back to floor unresponsive.  BP checked and 96/44.  Reportedly patient hypotensive during dialysis, given 250 cc NS and midodrine, but mentation did not improve.  Here, glucose 67, D50 given without improvement in condition.  IV fluids started and BP 70s to 90s, no significant improvement.  ECG obtained, showed no ST changes but showed junctional rhythm. No previous hx bradyarrhythmias. Differential includes MI, hypokalemia. Mag sulf 2g given and BMP ordered to check K.  NRB applied.  Zosyn restarted.  Patient is DNR.  Called family to confirm, which they did, and will come to bedside.  I suspect he is actively dying.  If we do not find obvious and correctable hypokalemia, I recommend transition to comfort cares.

## 2019-03-19 NOTE — Progress Notes (Signed)
Pharmacy Antibiotic Note  Bruce Cole is a 78 y.o. male admitted on 02/06/2019 with LLE cellulitis and necrosis recently on vancomycin and zosyn. Pharmacy consulted to restart zosyn for possible sepsis. He is noted with ESRD on HD TTS -WBC= 15, afebrile   Plan: -Zosyn 3.375gm IV q12h -Will follow  cultures and clinical progress    Height: 6\' 3"  (190.5 cm) Weight: 249 lb 9 oz (113.2 kg) IBW/kg (Calculated) : 84.5  Temp (24hrs), Avg:98.3 F (36.8 C), Min:97.6 F (36.4 C), Max:98.7 F (37.1 C)  Recent Labs  Lab 02/15/19 1220 02/16/19 0258 02/16/19 0930 02/17/19 0239 02/18/19 0346 02/19/19 0220 03-10-19 0218  WBC  --  13.4*  --  13.9* 14.5* 15.1* 15.0*  CREATININE  --   --  5.34* 6.54* 6.31* 4.52* 6.56*  VANCORANDOM 30  --   --   --   --  26  --      Allergies  Allergen Reactions  . Neurontin [Gabapentin] Other (See Comments)    Encephalopathy with myoclonic jerking  . Ace Inhibitors Other (See Comments)    Worsening renal insufficiency    Thank you for allowing pharmacy to be a part of this patient's care.  Hildred Laser, PharmD Clinical Pharmacist **Pharmacist phone directory can now be found on [email or web address] ([email or web address]).  Listed under [place].

## 2019-03-19 NOTE — Consult Note (Signed)
[place] Nurse wound follow up Wound type: Per Dr. Jess Barters note on 8/1, see photo taken on that date. Measurement: unchanged since initial assessment on 7/29, approximately 20 cm x 15cm Wound BWI:OMBTD eschar to posterior LE wounds Drainage (amount, consistency, odor) none  Periwound: intact, reduction in edema  Dressing procedure/placement/frequency: No topical care to eschar covered wounds at posterior left LE.  Non contact layer applied to wounds to avoid sticking and provide atraumatic removal. Profore 4-layer bandaging system is applied with slightly less than the recommended 50% stretch to the top layer, [place], since patient did not tolerate that last week.  Please contact Dr. Sharol Given if patient requests to have dressings removed.  Dr. Sharol Given states in his note dated 8/1 that he will follow up in the office.  [place] nursing team will follow for weekly Profore dressing changes, and will remain available to this patient, the nursing and medical teams.   Thanks, Maudie Flakes, MSN, RN, [place], Arther Abbott  Pager# [phone]

## 2019-03-19 NOTE — Significant Event (Signed)
Rapid Response Event Note  Overview: Unresponsive  Initial Focused Assessment: Called by nurse who was concerned about patient's neurologic and respiratory status. Per nurse, patient had just returned from HD. Upon arrival, patient's breathing was labored, patient did not respond to painful stimuli, blood sugar was 67, skin was cold and mottled, + diaphoresis present, lung sounds were diminished overall. SBP in the 70s, HR in the upper 40s - low 50s, oxygen saturation was not reading b/c skin was so cold. RN paged MD and MD came to bedside.  Interventions: -- NS bolus wide open - no improvement in BP -- 1 amp D50 -- Placed on NRB 15L - saturations improved briefly but quickly dropped off despite being maxed on oxygen -- EKG - JR -- Mag 2 gms IV given -- STAT BMP ordered  -- Morphine IV   Plan of Care: -- Patient's presentation - appears to actively dying, patient's breathing became slower, he seemed quite uncomfortable. TRH MD was present the entire time, family was called and asked to come in to see patient. We administered Morphine as well. BP, HR, RR, and SpO2 continued to drop despite interventions. Son and Yolanda Bonine came to bedside. Morphine was administered again.   Event Summary: Call Time 1735 Arrival Time 8264 End Time 1843   Bruce Cole R

## 2019-03-19 NOTE — TOC Progression Note (Addendum)
Transition of Care [facility]) - Progression Note    Patient Details  Name: GIORGI DEBRUIN MRN: [id number] Date of Birth: 04/17/1941  Transition of Care [facility]) CM/SW [place], [place] Phone Number: 02/22/2019, 12:06 PM  Clinical Narrative:  2:45pm-  Returned call to pt daughter [name] again, we discussed barriers to placement in [place] including cost of transport, need to switch dialysis, and cost of transport to dialysis when pt arrives to SNF. Pt daughter and CSW discussed again need to consider pt placement here in [place]. Confirmed with [name] that CSW would text pt daughter with three offers. An additional SNF is now reviewing. No additional offers available at this time. Text with offers sent. CSW offered for any SNFs that pt daughter interested in that [place] could f/u with and connect her with admissions directors.   2:02pm- CSW received a call from [place], admissions with [facility]. Admissions could complete the [facility] and Screen should pt be accepting of cost to transport pt to facility and cost of transport to dialysis 3x a week which they do not provide. CSW will f/u with daughter. Confirmed with CSW [facility] Supervisor Barbette Or that if pt family cannot commit to transport then we will need to pursue local placement and cannot hold past medical stability. Pt does have three offers here in [place]: [place], [place], and [place]. CSW has followed up with pending offers and at this time due to dialysis transport no additional local offers.  1:45pm- Spoke with pt daughter [name], provided estimates of ~$13,000 for [facility] and ~$5,000. We discussed the below information of PRI/Screen as well. Pt daughter requesting call back in about 30 minutes.  1:12pm- Return call from admissions at [facility]; they have received referral. Per admissions at [facility] all nursing facilities in the state of [place] require a PRI and Screen; this  must be done by a professional in the state of [place] and per admissions family would have to hire an agency to complete this on their behalf telephonically. This can run $300-$400. As this CSW is not familiar with this documentation and cannot complete this personally CSW requested that admissions call and discuss this with pt daughter before CSW continues to proceed with placement.     12:47pm- Referral resent to [place]; new referral sent to [facility]. Estimates received from [place] and [employer]. Will f/u with daughter.    12:06pm- Spoke with pt daughter Shirlean Schlein, [phone]; we discussed that CSW is attempting to get in touch with [facility] regarding referral sent. We discussed the feasibility of getting pt to [place] and [place] encouraged pt daughter to have a back up plan should pt have to remain in [place] due to logistics of placement. Pt daughter strongly prefers that pt not discharge home or to SNF here in [place] but rather closer to her. She feels that she does not have a good link to communication with her father being so far and that she would prefer to have him closer to her to assist him and be better involved in his care planning. Pt daughter provided two additional rehab centers that she is interested in: [place] [phone]) and [facility] [phone]).   Pt daughter had inquired about how best to get pt to [place]. At this time after discussing with PT, pt would likely not be able to tolerate a commercial flight or personal vehicle transport. Discussed with pt daughter  possibility of medical flight or ambulance up to [place]. She is aware these are often pricey and would like some more information for her to get an estimate. CSW will provide this.   CSW called and spoke with [facility] and will send referral to [phone]; message was also left with [facility].   Pam, renal coordinator covering for Jaclyn Shaggy is  following to assist with dialysis referral once a facility has accepted the pt for placement.     Expected Discharge Plan: Skilled Nursing Facility Barriers to Discharge: Ship broker, Continued Medical Work up, SNF Pending bed offer  Expected Discharge Plan and Services Expected Discharge Plan: [place] In-house Referral: Clinical Social Work Discharge Planning Services: CM Consult Post Acute Care Choice: [place] arrangements for the past 2 months: [place] Determinants of Health (SDOH) Interventions    Readmission Risk Interventions Readmission Risk Prevention Plan 02/13/2019  Transportation Screening Complete  Medication Review Press photographer) Complete  PCP or Specialist appointment within 3-5 days of discharge Not Complete  PCP/Specialist Appt Not Complete comments likely pt will dc to SNF  [place] or Home Care Consult Complete  SW Recovery Care/Counseling Consult Complete  Palliative Care Screening Not Applicable  Skilled Nursing Facility Complete  Some recent data might be hidden

## 2019-03-19 NOTE — Progress Notes (Signed)
Patient stopped breathing and no heart rate. Dr. Loleta Books made aware. Son at bedside.

## 2019-03-19 NOTE — Progress Notes (Signed)
Physical Therapy Treatment Patient Details Name: Bruce Cole MRN: [id number] DOB: September 26, 1940 Today's Date: 14-Mar-2019    History of Present Illness Pt is a 78 y/o male admitted 02/09/2019 secondary to fall and seizure. Also found to have LLE wound and cellulitis. Pt has been receiving HD TTS since admission; hospital course complicated by prolonged bleeding from AV fistula postdialysis. S/p LUE fistulagram 8/5. PMH includes HTN, a fib, DM, ESRD on HD, gout, CHF.   PT Comments    Pt tolerated maxisky transfer to recliner well with modA for bed mobility/pad placement, requires maxA for BLE management due to pain and weakness. Pt with flat affect despite this being his first transfer OOB while admitted; despite this, able to engage and follow commands with max cuing. Recommend more frequent transfers OOB with nursing staff using lift. Continue to recommend SNF-level thearpies    Follow Up Recommendations  SNF;Supervision/Assistance - 24 hour     Equipment Recommendations  (TBD)    Recommendations for Other Services       Precautions / Restrictions Precautions Precautions: Fall Precaution Comments: Posterior BLE wounds Restrictions Weight Bearing Restrictions: No    Mobility  Bed Mobility Overal bed mobility: Needs Assistance Bed Mobility: Rolling Rolling: Mod assist         General bed mobility comments: Able to roll R/L with modA when cued to assist with UE support on bed rails  Transfers Overall transfer level: Needs assistance               General transfer comment: Pt in [facility] room. Tolerated transfer from bed to recliner with lift well; assist +2 for lift and BLE assist  Ambulation/Gait                 Stairs             Wheelchair Mobility    Modified Rankin (Stroke Patients Only)       Balance Overall balance assessment: Needs assistance Sitting-balance support: Bilateral upper extremity supported;Feet supported Sitting  balance-Leahy Scale: Poor Sitting balance - Comments: Reliant on UE support and assist to translate weight anteriorly and maintain upright posture in recliner                                    Cognition Arousal/Alertness: Awake/alert Behavior During Therapy: WFL for tasks assessed/performed;Flat affect Overall Cognitive Status: No family/caregiver present to determine baseline cognitive functioning                                 General Comments: Slowed processing, following some commands with increased time; but answering questions and engaging in conversation appropriately majority of session. Focused on wanting a something to drink (currently NPO) despite therapist's attempt to explain/education      Exercises      General Comments General comments (skin integrity, edema, etc.): O2 [place] removed for transfer, SpO2 maintaining 92% on RA; HR 68      Pertinent Vitals/Pain Pain Assessment: Faces Faces Pain Scale: Hurts little more Pain Location: LLE  Pain Descriptors / Indicators: Grimacing;Guarding Pain Intervention(s): Repositioned    Home Living                      Prior Function            PT Goals (current goals can now be found in  the care plan section) Acute Rehab PT Goals Patient Stated Goal: to go to rehab to get stronger and then to ALF PT Goal Formulation: With patient/family Time For Goal Achievement: 02/27/19 Potential to Achieve Goals: Good Progress towards PT goals: Progressing toward goals    Frequency    Min 2X/week      PT Plan Current plan remains appropriate    Co-evaluation PT/OT/SLP Co-Evaluation/Treatment: Yes Reason for Co-Treatment: For patient/therapist safety;To address functional/ADL transfers PT goals addressed during session: Mobility/safety with mobility        AM-PAC PT "6 Clicks" Mobility   Outcome Measure  Help needed turning from your back to your side while in a flat bed without  using bedrails?: A Lot Help needed moving from lying on your back to sitting on the side of a flat bed without using bedrails?: Total Help needed moving to and from a bed to a chair (including a wheelchair)?: Total Help needed standing up from a chair using your arms (e.g., wheelchair or bedside chair)?: Total Help needed to walk in hospital room?: Total Help needed climbing 3-5 steps with a railing? : Total 6 Click Score: 7    End of Session   Activity Tolerance: Patient tolerated treatment well Patient left: in chair;with call bell/phone within reach Nurse Communication: Mobility status;Need for lift equipment PT Visit Diagnosis: Muscle weakness (generalized) (M62.81);Difficulty in walking, not elsewhere classified (R26.2);Pain;Unsteadiness on feet (R26.81) Pain - Right/Left: Left Pain - part of body: Leg     Time: 0973-5329 PT Time Calculation (min) (ACUTE ONLY): 24 min  Charges:  $Therapeutic Activity: 8-22 mins           Mabeline Caras, PT, DPT Acute Rehabilitation Services  Pager [phone] [facility] 03/02/2019, 12:59 PM

## 2019-03-19 NOTE — Progress Notes (Signed)
Patient returned from HD, patient unresponsive. SBP 90s, CBG 67. Amp IV dextrose given, rapid response, and Dr. Loleta Books called. CBG recheck 120. Patient placed on non-rebreather. EKG obtained. Will continue to monitor. RN continued at bedside.

## 2019-03-19 NOTE — Procedures (Signed)
Interventional Radiology Procedure Note  Procedure: LUE circuit fistula-gram.   Patient reports no issues at dialysis.   Findings: Excellent thrill.  Aneurysm formation. Images show no high grade stenosis to treat. Adequate flow.   Complications: None  Recommendations:  - Ok to use - If continue issue, would refer for duplex exam to measure velocities/flow.   - Routine care   Signed,  Dulcy Fanny. Earleen Newport, DO

## 2019-03-19 NOTE — Progress Notes (Addendum)
Initial Nutrition Assessment  RD working remotely.  DOCUMENTATION CODES:   Obesity unspecified  INTERVENTION:   -Continue renal MVI daily -Nepro Shake po TID, each supplement provides 425 kcal and 19 grams protein -Increase 30 ml Prostat to TID, each supplement provides 100 kcals and 15 grams protein -Magic cup TID with meals, each supplement provides 290 kcal and 9 grams of protein -Feeding assistance with meals  NUTRITION DIAGNOSIS:   Increased nutrient needs related to wound healing as evidenced by estimated needs.  GOAL:   Patient will meet greater than or equal to 90% of their needs  MONITOR:   PO intake, Supplement acceptance, Diet advancement, Labs, Weight trends, Skin, I & O's  REASON FOR ASSESSMENT:   LOS    ASSESSMENT:   Bruce Cole is a 78 y.o. male with medical history significant of ESRD on MWF HD; hypertension; CHF; A. fib on Coumadin; chronic zoster on Valtrex; chronic combined CHF; and recent diagnosis of seizures presenting with a fall.  He got up with his walker and was transferring to the commode.  The walker slid and he fell and could not get up.  He did not injure himself (other than his pride) but was unable to call for help.  His aide came to check on him about 730 and found him there.  During his prior admission, he fell on his hip and was complaining about pain.  He was discharged to [facility] rehab and a scar developed on the back of his leg.  No fevers.  He has not felt ill.  He gets his temperature checked routinely at HD.  His last HD was Friday and uncomplicated.  Pt admitted with possible RLE cellulitis.   8/5- s/p LUE circuit fistula-gram  Reviewed I/O's: +180 ml x 24 hours and -8.4 L since admission  Pt in HD at time of visit. Unable to obtain further nutrition-related history at this time. Per chart review, pt is a poor historian and has has periods of confusion.   Per nephrology notes, dry wt is 123.5 kg. Reviewed wt hx, pt has  experienced a 3.9% wt loss over the past 3 months, which is not significant for time frame.   Pt with very poor oral intake; noted meal completion records 0-25% per doc flowsheets. Pt has Prostat ordered, which he was initially refusing, however, accepted the last two doses. Per RN notes, pt requires feeding assistance with meals.   Per [facility] notes, pt with LLE wounds due to venous insufficiency. Pt with increased nutrient needs related to wound healing and ESRD on HD and would greatly benefit from addition of nutritional supplements.   Medications reviewed and include vitamin B-12.   Per CSW notes, pt daughter desiring SNF placement/ transfer in [place] to be closer to family.   Lab Results  Component Value Date   HGBA1C 6.2 (H) 02/07/2019   PTA DM medications are none.   Labs reviewed: CBGS: 110-123 (inpatient orders for glycemic control are 0-15 units insulin aspart TID with meals).   Diet Order:   Diet Order            Diet NPO time specified Except for: Sips with Meds  Diet effective midnight              EDUCATION NEEDS:   No education needs have been identified at this time  Skin:  Skin Assessment: Skin Integrity Issues: Skin Integrity Issues:: Other (Comment), Stage II Stage II: rt buttocks Other: LLE venous insufficency wounds  with eschar  Last BM:  03-14-19  Height:   Ht Readings from Last 1 Encounters:  02/12/2019 6\' 3"  (1.905 m)    Weight:   Wt Readings from Last 1 Encounters:  03/14/19 113 kg    Ideal Body Weight:  89.1 kg  BMI:  Body mass index is 31.14 kg/m.  Estimated Nutritional Needs:   Kcal:  2200-2400  Protein:  125-150 grams  Fluid:  1000 ml + UOP    Dynasia Kercheval A. Jimmye Norman, RD, LDN, [place] Registered Dietitian II Certified Diabetes Care and Education Specialist Pager: [phone] After hours Pager: [phone]

## 2019-03-19 NOTE — Progress Notes (Signed)
[facility] Progress Note   Subjective:   Seen in room. Feeling better, denies any SOB or swelling  Objective: Vitals:   02/19/19 0449 02/19/19 1459 02/19/19 2008 03/21/19 0601  BP: 129/71 (!) 141/97 114/68 100/61  Pulse: 69 69 66 66  Resp: 20 19 19 19   Temp: 98 F (36.7 C) 98.9 F (37.2 C) 98.7 F (37.1 C) 97.6 F (36.4 C)  TempSrc: Oral Oral Oral Oral  SpO2: 92% 98% 93% 97%  Weight:    109 kg  Height:       Physical Exam General: Chronically ill appearing eldelry male, severe debility, being moved up into chair w/ hoyer lift, unable to assist but minimally Heart: RRR, no murmurs,rubs or gallops Lungs: Diminished breath sounds bilateral bases. No wheezing, rhonchi or rales appreciated Abdomen: Soft, non-tender, non-distended. + BS Extremities: No edema RLL, left leg wrapped Dialysis Access:  LUE AVF + thrill  Additional Objective Labs: Basic Metabolic Panel: Recent Labs  Lab 02/15/19 0854 02/16/19 0930  02/18/19 0346 02/19/19 0220 2019-03-21 0218  NA 135 132*   < > 135 137 137  K 3.7 4.1   < > 4.2 4.2 4.4  CL 94* 95*   < > 95* 97* 94*  CO2 25 24   < > 23 25 26   GLUCOSE 103* 135*   < > 121* 113* 131*  BUN 16 27*   < > 34* 21 42*  CREATININE 4.04* 5.34*   < > 6.31* 4.52* 6.56*  CALCIUM 9.3 9.6   < > 9.7 9.4 9.4  PHOS 2.5 3.8  --  4.2  --   --    < > = values in this interval not displayed.   Liver Function Tests: Recent Labs  Lab 02/15/19 0854 02/16/19 0930 02/18/19 0346  ALBUMIN 3.3* 3.1* 3.1*   CBC: Recent Labs  Lab 02/16/19 0258 02/17/19 0239 02/18/19 0346 02/19/19 0220 03/21/2019 0218  WBC 13.4* 13.9* 14.5* 15.1* 15.0*  NEUTROABS  --   --  12.1*  --   --   HGB 9.4* 9.5* 9.9* 9.9* 9.5*  HCT 29.5* 30.2* 31.1* 31.5* 30.1*  MCV 96.7 96.2 96.0 97.2 97.7  PLT 275 296 315 326 326   Blood Culture    Component Value Date/Time   SDES  01/26/2019 0950    BLOOD RIGHT HAND Performed at [facility], [address] [place], [place] [address]    [facility]  02/02/2019 0950    BOTTLES DRAWN AEROBIC AND ANAEROBIC Blood Culture results may not be optimal due to an inadequate volume of blood received in culture bottles Performed at [facility], [address] [place], [place] [address]    CULT  01/16/2019 0950    NO GROWTH 5 DAYS Performed at [facility], [place] [address] [place], [place] [address]    REPTSTATUS 02/15/2019 FINAL 01/31/2019 0950   CBG: Recent Labs  Lab 02/19/19 0748 02/19/19 1227 02/19/19 1641 02/19/19 2045 Mar 21, 2019 0735  GLUCAP 103* 123* 114* 123* 110*   Medications: . albumin human     . aspirin EC  81 mg Oral Daily  . atorvastatin  10 mg Oral Daily  . Chlorhexidine Gluconate Cloth  6 each Topical Q0600  . darbepoetin (ARANESP) injection - DIALYSIS  100 mcg Intravenous Q Wed-HD  . feeding supplement (PRO-STAT SUGAR FREE 64)  30 mL Oral BID  . FLUoxetine  20 mg Oral Daily  . insulin aspart  0-15 Units  Subcutaneous TID WC  . midodrine  10 mg Oral Q M,W,F-HD  . midodrine  5 mg Oral BID WC  . multivitamin  1 tablet Oral QHS  . mupirocin ointment  1 application Nasal BID  . pantoprazole  40 mg Oral Daily  . vitamin B-12  1,000 mcg Oral Daily    Dialysis: SW MWF  4h  123.5kg  2/2.25 bath  Hep 2000  LUE AVF  425/800 Mircera 125mcg IV q 2 weeks  Renvela 800mg  3 tabs PO TID  Assessment/Plan: 1. Shortness: had pulm edema initially, echo also showed significant R heart failure and EF 35-40%. SP multiple extra dialysis treatments and wt's down 19kg (128 > 109kg). SOB resolved and vol overload resolved.  2. Cellulitis:Left lower extremity, seen by ortho.MRI completed without osteomyelitis, plan per ortho was compression. 3. ESRD: on HD MWF. HD today.  4. Post HD AVF bleeding:Prolonged bleeding after dialysison 7/28, IV heparin likely contributing. Bleeding abated. IR has been consulted for fistulogram but could not complete previously due to elevated INR. Off of  heparin and brilinta. Fistulogram pending per IR.  5. Hypotension: pressure chronically low, on midodrine.BP stable. Orthostatics ordered with HD today.  6. Anemia:Hemoglobin 9.9.Continuearanesp 52mcg IV q week with dialysis. 7. Metabolic bone disease:Calcium9.7- not on VDRA, use low calcium bath. Last outpatient PTH 241.Phos binders on hold due to low phosphorus, now improved to 4.2.  8. Nutrition:Albumin 3.1. Continue supplements.  9. A. Fib on coumadin: INR subtherapeuticon admission, initially on heparin drip. Per pharmacy/primary 10. T2DM: Currently in SSI, per primary.He is interested in resuming outpatient meds on discharge if possible.  11.Seizure disorder:On Keppra 12. Dispo: SNF, per notes daughter is interested in finding and SNF in [place].  Not sure this is going to happen or not, have d/w SW.     Kelly Splinter  MD 02/25/19, 11:19 AM

## 2019-03-19 NOTE — Discharge Summary (Signed)
Expiration Note/ Death Summary  Bruce Cole  MR#: [id number]  DOB:03/02/1941  Date of Admission: February 16, 2019 Date of Death: February 26, 2019  Attending [place]  Patient's PCP: Debbrah Alar, NP  Consults: Treatment Team:  Newt Minion, MD Claudia Desanctis, MD  Cause of Death: Heart failure, end stage  Secondary Diagnoses Present on Admission: . Cellulitis . ATRIAL FIBRILLATION . Chronic combined systolic and diastolic heart failure ([place]) . DM (diabetes mellitus), type 2 with renal complications ([place]) . Essential hypertension    Brief H and P: Bruce Cole was a 78 y.o. M with hx ESRD on HD MWF, chronic combined systolic and diastolic CHF EF 83-25%, hypotension on midodrine, RA on Plaquenil, DM, PVD, Afib on warfarin, and recent new onset seizures who presented with recurrent falls.  Patient had had generalized decline since spring.  In May, was admitted for recurrent falls, there was some thought that he may be having seizures and so he was started on Keppra.  He was discharged to rehab, but had made it back home, where he was getting weaker and weaker.  At the time of this admission, he was back home, but fell in the bathroom, lay there until his aide came five hours later and he was brought to the ER.  In the ER, he had leukocytosis but normal CXR, no fever, and the only obvious source of leukocytosis was his chronic posterior leg ulcers.  He was started on cefazolin and admitted.     Hospital Course: In the hospital, the patient was treated with vancomycin and Zosyn.  Blood cultures were negative.  He was evaluated by Orthopedics and WOC, who recommended venous compression in addition to antibiotics and his wounds improved markedly, although his WBC remained high.  Due to dyspnea, a CXR was obtained that showed possible edema.  Cardiology were consulted, but it was clear the patient's volume status was managed by HD and his chronic severe  hypotension limited adjustment of dry weight.  Nephrology managed his routine HD in the hospital.  He had occasional bleeding from his fistula, and so on the day of death, he was NPO most of the day for a fistulogram, which was unremarkable, after which he ate and went to dialysis.  During dialysis, the patient was noted to be hypotensive and was given IV fluids and midodrine.    On arrival back to the floor, the patient was thought to be sleeping, but was found to be unresponsive, BP 70s/40s.  He was mottled, ashen.  HR was slow and distant without murmurs.  Breathing was agonal and there was wheezing bilaterally.  The abdomen was soft without distension.  The mental status was obtunded and eyes fixed and pupils unresponsive.    Fluids given without improvement.  Stat CBG showed glucose 67 and D50 was administered with no response.  ECG showed a new junctional rhythm.  Before further workup could be completed, the patient became pulseless.  Family were called and were present at bedside at the time of death.       Signed:  Edwin Dada M.D. [facility] 26-Feb-2019, 9:35 PM Pager: [phone]

## 2019-03-19 DEATH — deceased

## 2019-05-06 ENCOUNTER — Telehealth: Payer: Self-pay

## 2019-05-06 NOTE — Telephone Encounter (Signed)
Copied from [place] [id number]. Topic: General - Other >> May 06, 2019  2:50 PM Yvette Rack wrote: Reason for CRM: Estill Bamberg with [facility] ([place]) called for an update on the fax that was sent on 04/04/19. Cb# [phone]
